# Patient Record
Sex: Female | Born: 1935 | Race: White | Hispanic: No | Smoking: Former smoker
Health system: Southern US, Community
[De-identification: ages and names within clinical notes are randomized; demographics above are authoritative.]

## PROBLEM LIST (undated history)

## (undated) ENCOUNTER — Emergency Department (HOSPITAL_COMMUNITY): Payer: Self-pay | Source: Home / Self Care

## (undated) DIAGNOSIS — M109 Gout, unspecified: Secondary | ICD-10-CM

## (undated) DIAGNOSIS — I10 Essential (primary) hypertension: Secondary | ICD-10-CM

## (undated) DIAGNOSIS — K5792 Diverticulitis of intestine, part unspecified, without perforation or abscess without bleeding: Secondary | ICD-10-CM

## (undated) DIAGNOSIS — I2510 Atherosclerotic heart disease of native coronary artery without angina pectoris: Secondary | ICD-10-CM

## (undated) DIAGNOSIS — C3490 Malignant neoplasm of unspecified part of unspecified bronchus or lung: Secondary | ICD-10-CM

## (undated) DIAGNOSIS — R609 Edema, unspecified: Secondary | ICD-10-CM

## (undated) DIAGNOSIS — Z950 Presence of cardiac pacemaker: Secondary | ICD-10-CM

## (undated) DIAGNOSIS — E785 Hyperlipidemia, unspecified: Secondary | ICD-10-CM

## (undated) DIAGNOSIS — R059 Cough, unspecified: Secondary | ICD-10-CM

## (undated) DIAGNOSIS — I119 Hypertensive heart disease without heart failure: Secondary | ICD-10-CM

## (undated) DIAGNOSIS — G43909 Migraine, unspecified, not intractable, without status migrainosus: Secondary | ICD-10-CM

## (undated) DIAGNOSIS — J449 Chronic obstructive pulmonary disease, unspecified: Secondary | ICD-10-CM

## (undated) DIAGNOSIS — G4730 Sleep apnea, unspecified: Secondary | ICD-10-CM

## (undated) DIAGNOSIS — I219 Acute myocardial infarction, unspecified: Secondary | ICD-10-CM

## (undated) DIAGNOSIS — E119 Type 2 diabetes mellitus without complications: Secondary | ICD-10-CM

## (undated) DIAGNOSIS — H9190 Unspecified hearing loss, unspecified ear: Secondary | ICD-10-CM

## (undated) DIAGNOSIS — R05 Cough: Secondary | ICD-10-CM

## (undated) DIAGNOSIS — Z8701 Personal history of pneumonia (recurrent): Secondary | ICD-10-CM

## (undated) DIAGNOSIS — Z9889 Other specified postprocedural states: Secondary | ICD-10-CM

## (undated) DIAGNOSIS — N184 Chronic kidney disease, stage 4 (severe): Secondary | ICD-10-CM

## (undated) DIAGNOSIS — E240 Pituitary-dependent Cushing's disease: Secondary | ICD-10-CM

## (undated) DIAGNOSIS — I429 Cardiomyopathy, unspecified: Secondary | ICD-10-CM

## (undated) DIAGNOSIS — M1990 Unspecified osteoarthritis, unspecified site: Secondary | ICD-10-CM

## (undated) DIAGNOSIS — Z8719 Personal history of other diseases of the digestive system: Secondary | ICD-10-CM

## (undated) DIAGNOSIS — I4819 Other persistent atrial fibrillation: Secondary | ICD-10-CM

## (undated) DIAGNOSIS — R0902 Hypoxemia: Secondary | ICD-10-CM

## (undated) DIAGNOSIS — Z889 Allergy status to unspecified drugs, medicaments and biological substances status: Secondary | ICD-10-CM

## (undated) DIAGNOSIS — I340 Nonrheumatic mitral (valve) insufficiency: Secondary | ICD-10-CM

## (undated) DIAGNOSIS — F329 Major depressive disorder, single episode, unspecified: Secondary | ICD-10-CM

## (undated) DIAGNOSIS — J45909 Unspecified asthma, uncomplicated: Secondary | ICD-10-CM

## (undated) DIAGNOSIS — I5032 Chronic diastolic (congestive) heart failure: Secondary | ICD-10-CM

## (undated) DIAGNOSIS — F32A Depression, unspecified: Secondary | ICD-10-CM

## (undated) DIAGNOSIS — D638 Anemia in other chronic diseases classified elsewhere: Secondary | ICD-10-CM

## (undated) DIAGNOSIS — K219 Gastro-esophageal reflux disease without esophagitis: Secondary | ICD-10-CM

## (undated) HISTORY — PX: WRIST FRACTURE SURGERY: SHX121

## (undated) HISTORY — PX: TONSILLECTOMY: SUR1361

## (undated) HISTORY — PX: ABLATION: SHX5711

## (undated) HISTORY — PX: ADRENALECTOMY: SHX876

## (undated) HISTORY — PX: APPENDECTOMY: SHX54

## (undated) HISTORY — PX: ABDOMINAL HYSTERECTOMY: SHX81

## (undated) HISTORY — PX: CHOLECYSTECTOMY: SHX55

## (undated) HISTORY — PX: BREAST CYST EXCISION: SHX579

## (undated) HISTORY — PX: FRACTURE SURGERY: SHX138

## (undated) HISTORY — PX: TUBAL LIGATION: SHX77

## (undated) HISTORY — DX: Other persistent atrial fibrillation: I48.19

---

## 2004-08-09 ENCOUNTER — Ambulatory Visit: Payer: Self-pay | Admitting: Family Medicine

## 2004-09-22 ENCOUNTER — Ambulatory Visit: Payer: Self-pay

## 2005-01-10 ENCOUNTER — Ambulatory Visit: Payer: Self-pay | Admitting: Internal Medicine

## 2005-01-26 ENCOUNTER — Ambulatory Visit: Payer: Self-pay | Admitting: Internal Medicine

## 2005-02-25 ENCOUNTER — Ambulatory Visit: Payer: Self-pay | Admitting: Internal Medicine

## 2005-03-28 ENCOUNTER — Ambulatory Visit: Payer: Self-pay | Admitting: Internal Medicine

## 2005-04-28 ENCOUNTER — Ambulatory Visit: Payer: Self-pay | Admitting: Internal Medicine

## 2005-05-28 ENCOUNTER — Ambulatory Visit: Payer: Self-pay | Admitting: Internal Medicine

## 2005-07-11 ENCOUNTER — Ambulatory Visit: Payer: Self-pay | Admitting: Unknown Physician Specialty

## 2005-07-28 ENCOUNTER — Ambulatory Visit: Payer: Self-pay | Admitting: Unknown Physician Specialty

## 2008-01-14 ENCOUNTER — Ambulatory Visit: Payer: Self-pay | Admitting: Family Medicine

## 2009-01-14 ENCOUNTER — Ambulatory Visit: Payer: Self-pay | Admitting: Family Medicine

## 2009-11-01 ENCOUNTER — Inpatient Hospital Stay: Payer: Self-pay | Admitting: Internal Medicine

## 2009-11-16 ENCOUNTER — Ambulatory Visit: Payer: Self-pay | Admitting: Internal Medicine

## 2009-11-18 ENCOUNTER — Ambulatory Visit: Payer: Self-pay | Admitting: Internal Medicine

## 2009-11-30 ENCOUNTER — Inpatient Hospital Stay: Payer: Self-pay | Admitting: Internal Medicine

## 2009-12-15 ENCOUNTER — Ambulatory Visit: Payer: Self-pay | Admitting: Internal Medicine

## 2010-04-16 ENCOUNTER — Emergency Department: Payer: Self-pay | Admitting: Emergency Medicine

## 2012-12-17 ENCOUNTER — Ambulatory Visit: Payer: Self-pay | Admitting: Internal Medicine

## 2012-12-25 ENCOUNTER — Ambulatory Visit: Payer: Self-pay | Admitting: Internal Medicine

## 2013-03-13 ENCOUNTER — Ambulatory Visit: Payer: Self-pay | Admitting: Internal Medicine

## 2013-03-24 ENCOUNTER — Ambulatory Visit: Payer: Self-pay | Admitting: Cardiothoracic Surgery

## 2013-03-24 ENCOUNTER — Ambulatory Visit: Payer: Self-pay | Admitting: Oncology

## 2013-03-24 LAB — CBC CANCER CENTER
Basophil #: 0.1 x10 3/mm (ref 0.0–0.1)
Eosinophil #: 0.2 x10 3/mm (ref 0.0–0.7)
HGB: 11.1 g/dL — ABNORMAL LOW (ref 12.0–16.0)
Lymphocyte #: 1.8 x10 3/mm (ref 1.0–3.6)
MCH: 26.3 pg (ref 26.0–34.0)
MCV: 78 fL — ABNORMAL LOW (ref 80–100)
Neutrophil #: 6.4 x10 3/mm (ref 1.4–6.5)
Neutrophil %: 71.3 %
Platelet: 236 x10 3/mm (ref 150–440)
RBC: 4.23 10*6/uL (ref 3.80–5.20)
RDW: 16.3 % — ABNORMAL HIGH (ref 11.5–14.5)
WBC: 9 x10 3/mm (ref 3.6–11.0)

## 2013-03-24 LAB — COMPREHENSIVE METABOLIC PANEL
Alkaline Phosphatase: 84 U/L (ref 50–136)
Anion Gap: 8 (ref 7–16)
Bilirubin,Total: 0.4 mg/dL (ref 0.2–1.0)
Calcium, Total: 9.3 mg/dL (ref 8.5–10.1)
Chloride: 102 mmol/L (ref 98–107)
Co2: 27 mmol/L (ref 21–32)
Creatinine: 1.58 mg/dL — ABNORMAL HIGH (ref 0.60–1.30)
EGFR (African American): 36 — ABNORMAL LOW
Glucose: 103 mg/dL — ABNORMAL HIGH (ref 65–99)
Osmolality: 279 (ref 275–301)
Potassium: 4.6 mmol/L (ref 3.5–5.1)
SGOT(AST): 24 U/L (ref 15–37)
Sodium: 137 mmol/L (ref 136–145)
Total Protein: 7.6 g/dL (ref 6.4–8.2)

## 2013-03-25 LAB — CEA: CEA: 0.6 ng/mL (ref 0.0–4.7)

## 2013-03-28 ENCOUNTER — Ambulatory Visit: Payer: Self-pay | Admitting: Oncology

## 2013-03-28 ENCOUNTER — Ambulatory Visit: Payer: Self-pay | Admitting: Cardiothoracic Surgery

## 2013-04-02 ENCOUNTER — Ambulatory Visit: Payer: Self-pay | Admitting: Internal Medicine

## 2013-04-25 ENCOUNTER — Emergency Department (HOSPITAL_COMMUNITY)
Admission: EM | Admit: 2013-04-25 | Discharge: 2013-04-25 | Disposition: A | Discharge status: Home / Self Care | Payer: PRIVATE HEALTH INSURANCE | Attending: Emergency Medicine | Admitting: Emergency Medicine

## 2013-04-25 ENCOUNTER — Encounter (HOSPITAL_COMMUNITY): Payer: Self-pay | Admitting: Emergency Medicine

## 2013-04-25 DIAGNOSIS — IMO0002 Reserved for concepts with insufficient information to code with codable children: Secondary | ICD-10-CM | POA: Insufficient documentation

## 2013-04-25 DIAGNOSIS — J4489 Other specified chronic obstructive pulmonary disease: Secondary | ICD-10-CM | POA: Insufficient documentation

## 2013-04-25 DIAGNOSIS — Z880 Allergy status to penicillin: Secondary | ICD-10-CM | POA: Insufficient documentation

## 2013-04-25 DIAGNOSIS — Z79899 Other long term (current) drug therapy: Secondary | ICD-10-CM | POA: Insufficient documentation

## 2013-04-25 DIAGNOSIS — Y9389 Activity, other specified: Secondary | ICD-10-CM | POA: Insufficient documentation

## 2013-04-25 DIAGNOSIS — I10 Essential (primary) hypertension: Secondary | ICD-10-CM | POA: Insufficient documentation

## 2013-04-25 DIAGNOSIS — Z87891 Personal history of nicotine dependence: Secondary | ICD-10-CM | POA: Insufficient documentation

## 2013-04-25 DIAGNOSIS — Y9289 Other specified places as the place of occurrence of the external cause: Secondary | ICD-10-CM | POA: Insufficient documentation

## 2013-04-25 DIAGNOSIS — E119 Type 2 diabetes mellitus without complications: Secondary | ICD-10-CM | POA: Insufficient documentation

## 2013-04-25 DIAGNOSIS — W1789XA Other fall from one level to another, initial encounter: Secondary | ICD-10-CM | POA: Insufficient documentation

## 2013-04-25 DIAGNOSIS — J449 Chronic obstructive pulmonary disease, unspecified: Secondary | ICD-10-CM | POA: Insufficient documentation

## 2013-04-25 DIAGNOSIS — I509 Heart failure, unspecified: Secondary | ICD-10-CM | POA: Insufficient documentation

## 2013-04-25 DIAGNOSIS — M549 Dorsalgia, unspecified: Secondary | ICD-10-CM

## 2013-04-25 HISTORY — DX: Essential (primary) hypertension: I10

## 2013-04-25 HISTORY — DX: Chronic obstructive pulmonary disease, unspecified: J44.9

## 2013-04-25 MED ORDER — CYCLOBENZAPRINE HCL 10 MG PO TABS
5.0000 mg | ORAL_TABLET | Freq: Once | ORAL | Status: AC
  Administered 2013-04-25: 5 mg via ORAL
  Filled 2013-04-25: qty 1

## 2013-04-25 MED ORDER — CYCLOBENZAPRINE HCL 5 MG PO TABS: 5.0000 mg | ORAL_TABLET | Freq: Two times a day (BID) | ORAL | Status: DC | PRN

## 2013-04-25 MED ORDER — IBUPROFEN 200 MG PO TABS
400.0000 mg | ORAL_TABLET | Freq: Once | ORAL | Status: AC
  Administered 2013-04-25: 400 mg via ORAL
  Filled 2013-04-25: qty 2

## 2013-04-25 MED ORDER — OXYCODONE-ACETAMINOPHEN 5-325 MG PO TABS: 1.0000 | ORAL_TABLET | Freq: Four times a day (QID) | ORAL | Status: DC | PRN

## 2013-04-25 MED ORDER — IBUPROFEN 400 MG PO TABS: 400.0000 mg | ORAL_TABLET | Freq: Four times a day (QID) | ORAL | Status: AC

## 2013-04-25 MED ORDER — OXYCODONE-ACETAMINOPHEN 5-325 MG PO TABS
1.0000 | ORAL_TABLET | Freq: Once | ORAL | Status: AC
  Administered 2013-04-25: 1 via ORAL
  Filled 2013-04-25: qty 1

## 2013-04-25 NOTE — ED Notes (Signed)
Pt stated that she slipped off a ladder yesterday but did not fall. Pt c/o lower back pain that radiates down left leg. She was able to deal with pain yesterday and today she reached down to grab a towel she got a shooting pain that radiated to left leg, stated that she broke out in a cold sweat and thought she was going to pass out.

## 2013-04-25 NOTE — ED Notes (Signed)
EDP made aware of pt's pain/spasms

## 2013-04-25 NOTE — ED Notes (Signed)
Pt called out, c/o nausea.

## 2013-04-25 NOTE — ED Notes (Signed)
PT comfortable with d/c and f/u instructions. Prescriptions x3 

## 2013-04-25 NOTE — ED Provider Notes (Signed)
CSN: [id number]     Arrival date & time 04/25/13  1859 History   First MD Initiated Contact with Patient 04/25/13 1905     Chief Complaint  Patient presents with  . Back Pain   (Consider location/radiation/quality/duration/timing/severity/associated sxs/prior Treatment) Patient is a 79 y.o. female presenting with back pain.  Back Pain Location:  Gluteal region Quality:  Cramping and shooting Radiates to:  L posterior upper leg Onset quality:  Sudden Duration:  2 days Timing:  Intermittent Progression:  Worsening Chronicity:  New Context comment:  Slipped off a ladder Relieved by:  Narcotics and cold packs Worsened by:  Bending and twisting Associated symptoms: no abdominal pain, no bladder incontinence, no bowel incontinence, no chest pain, no fever, no numbness, no paresthesias, no perianal numbness and no tingling     Past Medical History  Diagnosis Date  . COPD (chronic obstructive pulmonary disease)   . Diabetes mellitus without complication   . CHF (congestive heart failure)   . Hypertension    Past Surgical History  Procedure Laterality Date  . Adrenalectomy    . Abdominal hysterectomy    . Appendectomy    . Cholecystectomy     No family history on file. History  Substance Use Topics  . Smoking status: Former Smoker    Types: Cigarettes    Quit date: 04/25/1998  . Smokeless tobacco: Not on file  . Alcohol Use: No   OB History   Grav Para Term Preterm Abortions TAB SAB Ect Mult Living                 Review of Systems  Constitutional: Negative for fever.  HENT: Negative for congestion.   Respiratory: Negative for cough and shortness of breath.   Cardiovascular: Negative for chest pain.  Gastrointestinal: Negative for nausea, vomiting, abdominal pain, diarrhea and bowel incontinence.  Genitourinary: Negative for bladder incontinence.  Musculoskeletal: Positive for back pain.  Neurological: Negative for tingling, numbness and paresthesias.  All other  systems reviewed and are negative.    Allergies  Ciprofloxacin; Doxycycline; Penicillins; Sulfa antibiotics; and Morphine and related  Home Medications   Current Outpatient Rx  Name  Route  Sig  Dispense  Refill  . cholecalciferol (VITAMIN D) 1000 UNITS tablet   Oral   Take 1,000 Units by mouth daily.         . cyanocobalamin 500 MCG tablet   Oral   Take 500 mcg by mouth daily.         . digoxin (LANOXIN) 0.25 MG tablet   Oral   Take 0.25 mg by mouth daily.         Marland Kitchen diltiazem (TIAZAC) 240 MG 24 hr capsule   Oral   Take 240 mg by mouth daily.         . febuxostat (ULORIC) 40 MG tablet   Oral   Take 40 mg by mouth daily.          . fenofibrate (TRICOR) 48 MG tablet   Oral   Take 48 mg by mouth daily.         . ferrous sulfate 325 (65 FE) MG EC tablet   Oral   Take 325 mg by mouth 2 (two) times daily.         . Fluticasone-Salmeterol (ADVAIR) 100-50 MCG/DOSE AEPB   Inhalation   Inhale 1 puff into the lungs every 12 (twelve) hours.         Marland Kitchen HYDROcodone-acetaminophen (NORCO) 7.5-325 MG per tablet  Oral   Take 1.5 tablets by mouth once.         . metFORMIN (GLUCOPHAGE) 500 MG tablet   Oral   Take 500 mg by mouth 2 (two) times daily with a meal.         . metoprolol (LOPRESSOR) 50 MG tablet   Oral   Take 50 mg by mouth 2 (two) times daily.         . valsartan (DIOVAN) 160 MG tablet   Oral   Take 160 mg by mouth daily.         . vitamin C (ASCORBIC ACID) 500 MG tablet   Oral   Take 500 mg by mouth daily.          BP 174/53  Pulse 94  Temp(Src) 98.1 F (36.7 C) (Oral)  Resp 18  SpO2 94% Physical Exam  Nursing note and vitals reviewed. Constitutional: She is oriented to person, place, and time. She appears well-developed and well-nourished. No distress.  HENT:  Head: Normocephalic and atraumatic.  Mouth/Throat: Oropharynx is clear and moist.  Eyes: Conjunctivae are normal. Pupils are equal, round, and reactive to light. No  scleral icterus.  Neck: Neck supple.  Cardiovascular: Normal rate, regular rhythm, normal heart sounds and intact distal pulses.   No murmur heard. Pulmonary/Chest: Effort normal and breath sounds normal. No stridor. No respiratory distress. She has no rales.  Abdominal: Soft. Bowel sounds are normal. She exhibits no distension. There is no tenderness.  Musculoskeletal: Normal range of motion.       Lumbar back: She exhibits spasm. She exhibits no bony tenderness, no swelling and no deformity.       Back:  Neurological: She is alert and oriented to person, place, and time.  Skin: Skin is warm and dry. No rash noted.  Psychiatric: She has a normal mood and affect. Her behavior is normal.    ED Course  Procedures (including critical care time) Labs Review Labs Reviewed - No data to display Imaging Review No results found.  MDM   1. Acute back pain    79 yo female who slipped on a ladder two days ago, causing her to wrench her back. She did not actually fall, but just slipped.  Pain was manageable yesterday, but became worse today when she bent to pick up a towel.  No midline pain.  Normal lower extremity strength and sensation.  Pain improved with narcotics and muscle relaxers.  Although pain appears related to muscle spasm, we discussed other possible etiologies of pain including AAA and kidney stone.  Pt declined further workup (including urinalysis, bloodwork, abdominal ultrasound, abd/pel CT) for these causes and understood the risks of misdiagnosis including death.  She will follow up with her primary doctor.      Houston Siren, MD 04/26/13 [phone]

## 2013-04-25 NOTE — ED Notes (Signed)
EDP at bedside  

## 2013-04-28 ENCOUNTER — Ambulatory Visit: Payer: Self-pay | Admitting: Cardiothoracic Surgery

## 2013-04-28 ENCOUNTER — Ambulatory Visit: Payer: Self-pay | Admitting: Oncology

## 2013-05-22 ENCOUNTER — Ambulatory Visit: Payer: Self-pay | Admitting: Internal Medicine

## 2013-06-27 ENCOUNTER — Ambulatory Visit: Payer: Self-pay | Admitting: Oncology

## 2013-07-01 ENCOUNTER — Ambulatory Visit: Payer: Self-pay | Admitting: Oncology

## 2013-07-28 ENCOUNTER — Ambulatory Visit: Payer: Self-pay | Admitting: Oncology

## 2013-10-28 ENCOUNTER — Ambulatory Visit: Payer: Self-pay | Admitting: Oncology

## 2013-10-30 ENCOUNTER — Ambulatory Visit: Payer: Self-pay | Admitting: Cardiothoracic Surgery

## 2013-11-04 ENCOUNTER — Ambulatory Visit: Payer: Self-pay | Admitting: Oncology

## 2013-11-26 ENCOUNTER — Ambulatory Visit: Payer: Self-pay | Admitting: Cardiothoracic Surgery

## 2013-12-09 ENCOUNTER — Ambulatory Visit: Payer: Self-pay | Admitting: Oncology

## 2013-12-09 LAB — CBC WITH DIFFERENTIAL/PLATELET
BASOS PCT: 0.8 %
Basophil #: 0.1 10*3/uL (ref 0.0–0.1)
EOS PCT: 2.5 %
Eosinophil #: 0.2 10*3/uL (ref 0.0–0.7)
HCT: 37.2 % (ref 35.0–47.0)
HGB: 12.2 g/dL (ref 12.0–16.0)
LYMPHS ABS: 1.6 10*3/uL (ref 1.0–3.6)
Lymphocyte %: 16.7 %
MCH: 25.7 pg — AB (ref 26.0–34.0)
MCHC: 32.8 g/dL (ref 32.0–36.0)
MCV: 78 fL — ABNORMAL LOW (ref 80–100)
MONO ABS: 0.6 x10 3/mm (ref 0.2–0.9)
MONOS PCT: 5.9 %
NEUTROS PCT: 74.1 %
Neutrophil #: 7 10*3/uL — ABNORMAL HIGH (ref 1.4–6.5)
PLATELETS: 219 10*3/uL (ref 150–440)
RBC: 4.76 10*6/uL (ref 3.80–5.20)
RDW: 14.8 % — AB (ref 11.5–14.5)
WBC: 9.4 10*3/uL (ref 3.6–11.0)

## 2013-12-09 LAB — PROTIME-INR
INR: 1
Prothrombin Time: 12.8 secs (ref 11.5–14.7)

## 2013-12-17 LAB — PATHOLOGY REPORT

## 2013-12-26 ENCOUNTER — Ambulatory Visit: Payer: Self-pay | Admitting: Cardiothoracic Surgery

## 2014-01-01 ENCOUNTER — Ambulatory Visit: Payer: Self-pay | Admitting: Oncology

## 2014-01-26 ENCOUNTER — Ambulatory Visit: Payer: Self-pay | Admitting: Oncology

## 2014-03-04 ENCOUNTER — Ambulatory Visit: Payer: Self-pay | Admitting: Oncology

## 2014-03-04 LAB — CBC CANCER CENTER
BASOS ABS: 0 x10 3/mm (ref 0.0–0.1)
BASOS PCT: 0.6 %
EOS ABS: 0.2 x10 3/mm (ref 0.0–0.7)
EOS PCT: 2.3 %
HCT: 37.7 % (ref 35.0–47.0)
HGB: 12.3 g/dL (ref 12.0–16.0)
LYMPHS PCT: 12.8 %
Lymphocyte #: 1 x10 3/mm (ref 1.0–3.6)
MCH: 25.7 pg — AB (ref 26.0–34.0)
MCHC: 32.7 g/dL (ref 32.0–36.0)
MCV: 79 fL — ABNORMAL LOW (ref 80–100)
MONO ABS: 0.3 x10 3/mm (ref 0.2–0.9)
MONOS PCT: 4.5 %
NEUTROS PCT: 79.8 %
Neutrophil #: 6.1 x10 3/mm (ref 1.4–6.5)
Platelet: 246 x10 3/mm (ref 150–440)
RBC: 4.79 10*6/uL (ref 3.80–5.20)
RDW: 15.7 % — ABNORMAL HIGH (ref 11.5–14.5)
WBC: 7.7 x10 3/mm (ref 3.6–11.0)

## 2014-03-04 LAB — COMPREHENSIVE METABOLIC PANEL
ALK PHOS: 53 U/L
ALT: 65 U/L (ref 12–78)
ANION GAP: 10 (ref 7–16)
AST: 66 U/L — AB (ref 15–37)
Albumin: 3.9 g/dL (ref 3.4–5.0)
BUN: 28 mg/dL — ABNORMAL HIGH (ref 7–18)
Bilirubin,Total: 0.4 mg/dL (ref 0.2–1.0)
CALCIUM: 9.2 mg/dL (ref 8.5–10.1)
CO2: 28 mmol/L (ref 21–32)
Chloride: 103 mmol/L (ref 98–107)
Creatinine: 1.56 mg/dL — ABNORMAL HIGH (ref 0.60–1.30)
GFR CALC AF AMER: 37 — AB
GFR CALC NON AF AMER: 31 — AB
Glucose: 137 mg/dL — ABNORMAL HIGH (ref 65–99)
Osmolality: 289 (ref 275–301)
POTASSIUM: 4.2 mmol/L (ref 3.5–5.1)
SODIUM: 141 mmol/L (ref 136–145)
TOTAL PROTEIN: 7.3 g/dL (ref 6.4–8.2)

## 2014-03-28 ENCOUNTER — Ambulatory Visit: Payer: Self-pay | Admitting: Oncology

## 2014-04-28 ENCOUNTER — Ambulatory Visit: Payer: Self-pay | Admitting: Oncology

## 2014-06-01 ENCOUNTER — Ambulatory Visit: Payer: Self-pay | Admitting: Oncology

## 2014-06-09 ENCOUNTER — Ambulatory Visit: Payer: Self-pay | Admitting: Oncology

## 2014-06-10 LAB — CBC CANCER CENTER
Basophil #: 0.1 x10 3/mm (ref 0.0–0.1)
Basophil %: 0.9 %
Eosinophil #: 0.2 x10 3/mm (ref 0.0–0.7)
Eosinophil %: 2.6 %
HCT: 38.5 % (ref 35.0–47.0)
HGB: 12.5 g/dL (ref 12.0–16.0)
LYMPHS ABS: 0.8 x10 3/mm — AB (ref 1.0–3.6)
LYMPHS PCT: 12.2 %
MCH: 26.4 pg (ref 26.0–34.0)
MCHC: 32.4 g/dL (ref 32.0–36.0)
MCV: 82 fL (ref 80–100)
MONO ABS: 0.3 x10 3/mm (ref 0.2–0.9)
MONOS PCT: 5.5 %
Neutrophil #: 5 x10 3/mm (ref 1.4–6.5)
Neutrophil %: 78.8 %
PLATELETS: 194 x10 3/mm (ref 150–440)
RBC: 4.72 10*6/uL (ref 3.80–5.20)
RDW: 15.3 % — AB (ref 11.5–14.5)
WBC: 6.3 x10 3/mm (ref 3.6–11.0)

## 2014-06-10 LAB — COMPREHENSIVE METABOLIC PANEL
ALBUMIN: 3.8 g/dL (ref 3.4–5.0)
ALK PHOS: 55 U/L
AST: 17 U/L (ref 15–37)
Anion Gap: 8 (ref 7–16)
BILIRUBIN TOTAL: 0.6 mg/dL (ref 0.2–1.0)
BUN: 17 mg/dL (ref 7–18)
CO2: 27 mmol/L (ref 21–32)
Calcium, Total: 8.9 mg/dL (ref 8.5–10.1)
Chloride: 104 mmol/L (ref 98–107)
Creatinine: 1.52 mg/dL — ABNORMAL HIGH (ref 0.60–1.30)
GFR CALC AF AMER: 43 — AB
GFR CALC NON AF AMER: 35 — AB
Glucose: 124 mg/dL — ABNORMAL HIGH (ref 65–99)
Osmolality: 281 (ref 275–301)
POTASSIUM: 4.2 mmol/L (ref 3.5–5.1)
SGPT (ALT): 23 U/L
SODIUM: 139 mmol/L (ref 136–145)
TOTAL PROTEIN: 6.9 g/dL (ref 6.4–8.2)

## 2014-06-28 ENCOUNTER — Ambulatory Visit: Payer: Self-pay | Admitting: Oncology

## 2014-09-11 DIAGNOSIS — E119 Type 2 diabetes mellitus without complications: Secondary | ICD-10-CM | POA: Insufficient documentation

## 2014-12-08 ENCOUNTER — Ambulatory Visit
Admit: 2014-12-08 | Disposition: A | Discharge status: Home / Self Care | Payer: Self-pay | Attending: Oncology | Admitting: Oncology

## 2014-12-09 LAB — CBC CANCER CENTER
Basophil #: 0.1 x10 3/mm (ref 0.0–0.1)
Basophil %: 1 %
EOS ABS: 0.2 x10 3/mm (ref 0.0–0.7)
EOS PCT: 3.1 %
HCT: 36.1 % (ref 35.0–47.0)
HGB: 11.7 g/dL — AB (ref 12.0–16.0)
LYMPHS ABS: 0.9 x10 3/mm — AB (ref 1.0–3.6)
Lymphocyte %: 12.2 %
MCH: 25.1 pg — ABNORMAL LOW (ref 26.0–34.0)
MCHC: 32.4 g/dL (ref 32.0–36.0)
MCV: 78 fL — AB (ref 80–100)
MONO ABS: 0.4 x10 3/mm (ref 0.2–0.9)
Monocyte %: 4.6 %
Neutrophil #: 6 x10 3/mm (ref 1.4–6.5)
Neutrophil %: 79.1 %
Platelet: 226 x10 3/mm (ref 150–440)
RBC: 4.65 10*6/uL (ref 3.80–5.20)
RDW: 15.6 % — ABNORMAL HIGH (ref 11.5–14.5)
WBC: 7.6 x10 3/mm (ref 3.6–11.0)

## 2014-12-09 LAB — COMPREHENSIVE METABOLIC PANEL
ALBUMIN: 3.9 g/dL
AST: 25 U/L
Alkaline Phosphatase: 44 U/L
Anion Gap: 6 — ABNORMAL LOW (ref 7–16)
BUN: 31 mg/dL — ABNORMAL HIGH
Bilirubin,Total: 0.6 mg/dL
CALCIUM: 8.7 mg/dL — AB
CHLORIDE: 105 mmol/L
Co2: 23 mmol/L
Creatinine: 1.27 mg/dL — ABNORMAL HIGH
EGFR (African American): 46 — ABNORMAL LOW
EGFR (Non-African Amer.): 40 — ABNORMAL LOW
Glucose: 181 mg/dL — ABNORMAL HIGH
Potassium: 4.5 mmol/L
SGPT (ALT): 16 U/L
Sodium: 134 mmol/L — ABNORMAL LOW
Total Protein: 6.8 g/dL

## 2014-12-19 NOTE — Consult Note (Signed)
Reason for Visit: This 81 year old Female patient presents to the clinic for initial evaluation of  lung cancer .   Referred by Dr. Oliva Burke.  Diagnosis:  Chief Complaint/Diagnosis   81 year old female with non-small cell lung cancer right upper lobe stage I disease  Pathology Report pathology report has been requested for my review   Imaging Report CT scans and PET/CT scan reviewed   Referral Report clinical notes reviewed   Planned Treatment Regimen possible surgical resection versus SB RT   HPI   patient is a 81 year old female who's been followed for a slightly increasing right upper lobe not lung nodule. PET scan was performed showing low metabolic uptake. She also has known splenic masses as well as multiple hypodense masses in the kidneys. She also has chronic enlargement of her adrenal gland is status post left adrenal adenoma with a history of Cushing's syndrome. Recently had a needle biopsy although I do not see that report is been verbally reported as non-small cell lung cancer of the right upper lobe. She is seen today for discussion of possible treatment options. Options are right upper lobectomy versus stereotactic body radiation therapy. She is doing fairly well. She is having no specific complaints. She specifically denies cough hemoptysis or chest tightness.  Past Hx:    cushings syndrome:    gout:    COPD:    DM:    HTN:    rapid HR "skips beats":    appendectomy:    hysterectomy:   Past, Family and Social History:  Past Medical History positive   Cardiovascular hypertension; skipped beats   Respiratory COPD   Endocrine diabetes mellitus; Cushing's syndrome   Past Surgical History appendectomy; hysterectomy   Past Medical History Comments gallops   Family History positive   Family History Comments father with coronary artery disease no family history of cancer   Social History positive   Social History Comments greater than 40-pack-year  smoking history quit smoking 15 years prior snow EtOH abuse history   Additional Past Medical and Surgical History accompanied by multiple family members today   Allergies:   PCN: Rash, Resp. Distress  Doxycycline: Rash, SOB  Morphine: Itching  Latex: Unknown  Home Meds:  Home Medications: Medication Instructions Status  Diovan 160 mg oral tablet 1 tab(s) orally once a day Active  Uloric 40 mg oral tablet 1 tab(s) orally once a day Active  fenofibrate 48 mg oral tablet 1 tab(s) orally once a day Active  Vitamin B-12 500 mcg oral tablet 1 tab(s) orally once a day Active  Vitamin D3 1000 intl units oral tablet 1 tab(s) orally once a day Active  Vitamin C 500 mg oral tablet 1 tab(s) orally once a day Active  metoprolol 50 mg tablet 1 tab(s) orally 2 times a day Active  diltiazem 24 hour extended release 240 mg/24 hours oral capsule, extended release 1 cap(s) orally once a day Active  Zoloft 25 mg oral tablet 1 tab(s) orally once a day Active  Norco 325 mg-7.5 mg oral tablet 1 tab(s) orally once a day, As Needed Active  [place]   , As Needed Active  metFORMIN 500 mg oral tablet  orally once a day Active  Advair Diskus 250 mcg-50 mcg powder 1 puff(s) inhaled 2 times a day x 30 days  Active  Spiriva 18 mcg capsule 1 ea inhaled once a day x 30 days  Active  Vitamin D 2000 units daily  Active  Lanoxin 250 mcg (0.25 mg)  oral tablet 1  orally once a day  Active  Slow FE one tab by mouth twice a day  Active   Review of Systems:  General negative   Performance Status (ECOG) 0   Skin negative   Breast negative   Ophthalmologic negative   ENMT negative   Respiratory and Thorax see HPI   Cardiovascular negative   Gastrointestinal negative   Genitourinary negative   Musculoskeletal negative   Neurological negative   Psychiatric negative   Hematology/Lymphatics negative   Endocrine see HPI   Allergic/Immunologic negative   Review of Systems   review of systems  obtained from nurses notes  Nursing Notes:  Nursing Vital Signs and Chemo Nursing Nursing Notes: *CC Vital Signs Flowsheet:   22-Apr-15 09:45  Temp Temperature 97.2  Pulse Pulse 69  Respirations Respirations 21  SBP SBP 165  DBP DBP 65  Current Weight (kg) (kg) 75.2  Height (cm) centimeters 163  BSA (m2) 1.8   Physical Exam:  General/Skin/HEENT:  General normal   Skin normal   Eyes normal   ENMT normal   Head and Neck normal   Additional PE well-developed female in NAD walks with a cane foramina [place] assistance. No cervical or supraclavicular adenopathy is appreciated. Lungs are clear to A&P cardiac examination shows regular rate and rhythm. Abdomen is benign.   Breasts/Resp/CV/GI/GU:  Respiratory and Thorax normal   Cardiovascular normal   Gastrointestinal normal   Genitourinary normal   MS/Neuro/Psych/Lymph:  Musculoskeletal normal   Neurological normal   Lymphatics normal   Other Results:  Radiology Results: LabUnknown:    03-Mar-15 11:36, CT Chest Without Contrast  PACS Image     10-Mar-15 10:40, PET/CT Scan Lung Cancer Restaging  PACS Image   CT:    03-Mar-15 11:36, CT Chest Without Contrast  CT Chest Without Contrast   REASON FOR EXAM:    RUL nodule follow up  COMMENTS:       PROCEDURE: CT  - CT CHEST WITHOUT CONTRAST  - Oct 28 2013 11:36AM     CLINICAL DATA:  Followup lung nodule    EXAM:  CT CHEST WITHOUT CONTRAST    TECHNIQUE:  Multidetector CT imaging of the chest was performed following the  standard protocol without IV contrast.    COMPARISON:  06/27/2013  FINDINGS:  The heart size is normal. There is no pericardial effusion.  Calcified atherosclerotic disease involves the thoracic aorta as  well as the LAD, and a left circumflex coronary artery. No enlarged  mediastinal or hilar lymph node. There is no axillary or  supraclavicular adenopathy. Low attenuation nodule in the right lobe  of thyroid gland measures 1.4 cm and  appears unchanged from previous  exam. Left lobe thyroid nodule measures 1.7 cm, image 5/series 2.    No pericardial effusion. There is a part solid nodule identified  within the right upper lobe which measures 2.2 cm. The solid  component measures 1.6 cm, image 12/series 2. On the previous  examination this measures 1.8 cm with a solid component measuring  1.3 cm. Stable subpleural nodule in the right lower lobe measuring 5  mm, image 36/series 3. In the left lower lobe there is a 5 mm nodule  which is unchanged, image 37/series 3. Also in the left lower lobe  there is a 4 mm nodule, image 28/series 3. Left upper lobe nodule  measures 3 mm, image number 22/series 3.    Review of the visualized osseous structures is significant for  mild  thoracic spondylosis. There is no aggressive lytic or sclerotic bone  lesions. Incidental imaging through the upper abdomen again shows  splenomegaly. Low-attenuation lesions within the spleen are  identified and appear unchanged from previous exam. The largest  measures 5.2 cm, image 48/series 2. Low attenuation nodule in the  right adrenal gland is stable measuring 2.8 cm, image 52/series 2.  This likely represents a benign adenoma.     IMPRESSION:  1. Part solid nodule in the right upper lobe is slightly increased  in size when compared with 12/17/2012.  Adenocarcinoma is considered. Thoracic surgery consultation is  recommended. PET/CT should be considered for staging purposes.    These recommendations are taken from "Recommendations for the  Management of Subsolid Pulmonary Nodules Detected at CT: A Statement  from the [facility]" Radiology 2013; 266:1, 304-317.    2. Other small nonspecific nodules are unchanged from previous exam.    3.  Stable splenomegaly and low-attenuation splenic lesions.    4.  Atherosclerotic disease including coronary artery calcification    5. Thyroid nodules. Consider further evaluation with  thyroid  ultrasound. If patient is clinically hyperthyroid, consider nuclear  medicine thyroid uptake and scan.    6. Stable right adrenal nodule which likely represents a benign  adenoma      Electronically Signed    By: Kerby Moors M.D.    On: 10/28/2013 13:16         Verified By: Angelita Ingles, M.D.,  Nuclear Med:    10-Mar-15 10:40, PET/CT Scan Lung Cancer Restaging  PET/CT Scan Lung Cancer Restaging   REASON FOR EXAM:    lung nodule  FU  COMMENTS:       PROCEDURE: PET - PET/CT RESTAGING LUNG CA  - Nov 04 2013 10:40AM     CLINICAL DATA:  Subsequent treatment strategy for re-evaluation of  lung nodules. No recent radiation therapy or chemotherapy.Marland Kitchen    EXAM:  NUCLEAR MEDICINE PET SKULL BASE TO THIGH    FASTING BLOOD GLUCOSE:  Value: 173 mg/dl    TECHNIQUE:  12.3 mCi F-18 FDG was injected intravenously. Full-ring PET imaging  was performed from the skull base to thigh after the radiotracer. CT  data was obtained and used for attenuation correction and anatomic  localization.    COMPARISON:  CT CHEST W/O CM dated 10/28/2013; CT CHEST-ABD W/O CM  dated 06/27/2013; NM PET TUM IMG LTD AREA dated 12/25/2012; CT  CHEST-ABD W/O CM dated 03/13/2013; CT ABD-PELV W/O CM dated 11/30/2009    FINDINGS:  NECK    No areas of abnormal hypermetabolism.    CHEST  2.1 x 2.0 cm and a SUV of 2.1 on image 64/series 4. This measured  1.6 x 2.0 and a S.U.V. max of 1.4 on the prior exam. No abnormal  nodal hypermetabolism.    ABDOMEN/PELVIS    No areas of abnormal hypermetabolism. Photopenia corresponds to the  dominant low-density splenic lesion.    SKELETON    No abnormal marrow activity.    CT IMAGES PERFORMED FOR ATTENUATION CORRECTION  Multiple thoracic compression deformities, present on the prior of  10/28/2013.    Low-density bilateral thyroid nodules which are nonspecific but were  present on the prior exam. Larger on the left.    Aortic and branch vessel  atherosclerosis. Mild cardiomegaly with  coronary artery atherosclerosis. New metallic clips within the right  upper lobe, in the region of the lung nodule. These could relate to  prior biopsy. Mild  nodularity more medially on image 64/series 4 is  similar to on the prior PET. Left lower lobe nodularity is also not  significantly changed. Mild centrilobular emphysema.    Similar right adrenal thickening. Mild bilateral renal atrophy.  Fluid density bilateral renal lesions, likely cysts. There are also  bilateral hyper attenuating renal lesions which are indeterminate.  The largest is in the interpolar left kidney and measures 1.7 cm on  image 143. This is similar in size to on the prior exam. Fifty-one  HU.    Hepatomegaly. Suspicion of cirrhosis, mild. Prominence of the  lateral segment left liver lobe and caudate lobe.    Grossly similar splenomegaly. Low-density splenic lesions which are  similar. Cholecystectomy. Scattered colonic diverticula.  Hysterectomy.     IMPRESSION:  1. Right upper lobe lung nodule which demonstrates increased mild  hypermetabolism and suspicion of minimal interval enlargement since  12/25/2012. This remains suspicious for a relatively indolent  bronchogenic carcinoma such as low-grade adenocarcinoma. Tissue  sampling should be considered. Given the relative indolent behavior,  if the patient is a poor biopsy or treatment candidate, imaging  follow-up would be an alternate consideration.  2. No evidence of thoracic nodal or extrathoracic disease.  3. Hepatosplenomegaly. Similar splenic lesions which are  indeterminate. Question incidental benign hemangiomas or  lymphangiomas versus an atypical appearance of splenic infarcts.  4. Suspect mild cirrhosis.  Correlate with risk factors.  5. Bilateral indeterminate renal lesions. These could be hemorrhagic  cysts or solid neoplasms. The largest left-sided lesion is unchanged  in size. If further imaging  characterization is desired, the test of  choice is pre and post contrast abdominal MRI (preferred) or CT.  Electronically Signed    By: Abigail Miyamoto M.D.    On: 11/04/2013 12:49         Verified By: Areta Haber, M.D.,   Relevent Results:   Relevant Scans and Labs CT scan and PET CT scans are reviewed   Assessment and Plan: Impression:   stage I non-small cell lung cancer right upper lobe in 81 year old female with multiple comorbidities Plan:   at this time about our recommendations for stereotactic body radiation therapy. I believe she would be an excellent candidate for this choice of modality. I discussed with her the 95% overall local control rate with this type of therapy. We treated to 5000 cGy in 5 fractions. Patient will also be discussing surgical options with Dr. Genevive Bi. Patient Tonny Bollman nurse navigator know of her decision next week. Risks and benefits of radiation therapy including possible slight dysphasia, skin reaction, fatigue, structures normal lung volume all were discussed in detail the patient and her family. They all seem to comprehend my treatment plan well.  I would like to take this opportunity for allowing me to participate in the care of your patient..  CC Referral:  cc: Dr.Hande   Electronic Signatures: Lula Michaux, Roda Shutters (MD)  (Signed 22-Apr-15 11:18)  Authored: HPI, Diagnosis, Past Hx, PFSH, Allergies, Home Meds, ROS, Nursing Notes, Physical Exam, Other Results, Relevent Results, Encounter Assessment and Plan, CC Referring Physician   Last Updated: 22-Apr-15 11:18 by Armstead Peaks (MD)

## 2014-12-21 ENCOUNTER — Ambulatory Visit
Admit: 2014-12-21 | Disposition: A | Discharge status: Home / Self Care | Payer: Self-pay | Attending: Oncology | Admitting: Oncology

## 2014-12-22 ENCOUNTER — Encounter (HOSPITAL_COMMUNITY)
Admission: EM | Disposition: A | Discharge status: Home / Self Care | Payer: Self-pay | Source: Other Acute Inpatient Hospital | Attending: Cardiovascular Disease

## 2014-12-22 ENCOUNTER — Inpatient Hospital Stay (HOSPITAL_COMMUNITY)
Admission: EM | Admit: 2014-12-22 | Discharge: 2014-12-24 | DRG: 247 | Disposition: A | Discharge status: Home / Self Care | Payer: Medicare Other | Source: Other Acute Inpatient Hospital | Attending: Cardiovascular Disease | Admitting: Cardiovascular Disease

## 2014-12-22 ENCOUNTER — Ambulatory Visit (HOSPITAL_COMMUNITY): Payer: Medicare Other

## 2014-12-22 ENCOUNTER — Encounter (HOSPITAL_COMMUNITY): Payer: Self-pay

## 2014-12-22 DIAGNOSIS — R002 Palpitations: Secondary | ICD-10-CM

## 2014-12-22 DIAGNOSIS — I129 Hypertensive chronic kidney disease with stage 1 through stage 4 chronic kidney disease, or unspecified chronic kidney disease: Secondary | ICD-10-CM | POA: Diagnosis present

## 2014-12-22 DIAGNOSIS — I25110 Atherosclerotic heart disease of native coronary artery with unstable angina pectoris: Secondary | ICD-10-CM | POA: Diagnosis not present

## 2014-12-22 DIAGNOSIS — Z885 Allergy status to narcotic agent status: Secondary | ICD-10-CM

## 2014-12-22 DIAGNOSIS — D3502 Benign neoplasm of left adrenal gland: Secondary | ICD-10-CM | POA: Diagnosis present

## 2014-12-22 DIAGNOSIS — Z881 Allergy status to other antibiotic agents status: Secondary | ICD-10-CM

## 2014-12-22 DIAGNOSIS — I509 Heart failure, unspecified: Secondary | ICD-10-CM | POA: Diagnosis present

## 2014-12-22 DIAGNOSIS — N183 Chronic kidney disease, stage 3 (moderate): Secondary | ICD-10-CM | POA: Diagnosis present

## 2014-12-22 DIAGNOSIS — Z880 Allergy status to penicillin: Secondary | ICD-10-CM

## 2014-12-22 DIAGNOSIS — I4891 Unspecified atrial fibrillation: Secondary | ICD-10-CM | POA: Diagnosis present

## 2014-12-22 DIAGNOSIS — I209 Angina pectoris, unspecified: Secondary | ICD-10-CM

## 2014-12-22 DIAGNOSIS — I249 Acute ischemic heart disease, unspecified: Secondary | ICD-10-CM | POA: Diagnosis present

## 2014-12-22 DIAGNOSIS — I200 Unstable angina: Secondary | ICD-10-CM | POA: Diagnosis present

## 2014-12-22 DIAGNOSIS — I10 Essential (primary) hypertension: Secondary | ICD-10-CM

## 2014-12-22 DIAGNOSIS — Z7902 Long term (current) use of antithrombotics/antiplatelets: Secondary | ICD-10-CM

## 2014-12-22 DIAGNOSIS — Z7901 Long term (current) use of anticoagulants: Secondary | ICD-10-CM

## 2014-12-22 DIAGNOSIS — Z923 Personal history of irradiation: Secondary | ICD-10-CM

## 2014-12-22 DIAGNOSIS — R079 Chest pain, unspecified: Secondary | ICD-10-CM

## 2014-12-22 DIAGNOSIS — R0789 Other chest pain: Secondary | ICD-10-CM

## 2014-12-22 DIAGNOSIS — Z79899 Other long term (current) drug therapy: Secondary | ICD-10-CM

## 2014-12-22 DIAGNOSIS — Z955 Presence of coronary angioplasty implant and graft: Secondary | ICD-10-CM

## 2014-12-22 DIAGNOSIS — Z87891 Personal history of nicotine dependence: Secondary | ICD-10-CM

## 2014-12-22 DIAGNOSIS — Z85118 Personal history of other malignant neoplasm of bronchus and lung: Secondary | ICD-10-CM

## 2014-12-22 DIAGNOSIS — Z882 Allergy status to sulfonamides status: Secondary | ICD-10-CM

## 2014-12-22 DIAGNOSIS — E249 Cushing's syndrome, unspecified: Secondary | ICD-10-CM | POA: Diagnosis present

## 2014-12-22 DIAGNOSIS — E119 Type 2 diabetes mellitus without complications: Secondary | ICD-10-CM

## 2014-12-22 DIAGNOSIS — J449 Chronic obstructive pulmonary disease, unspecified: Secondary | ICD-10-CM | POA: Diagnosis present

## 2014-12-22 HISTORY — DX: Gout, unspecified: M10.9

## 2014-12-22 HISTORY — DX: Pituitary-dependent Cushing's disease: E24.0

## 2014-12-22 HISTORY — DX: Malignant neoplasm of unspecified part of unspecified bronchus or lung: C34.90

## 2014-12-22 HISTORY — DX: Type 2 diabetes mellitus without complications: E11.9

## 2014-12-22 HISTORY — DX: Gastro-esophageal reflux disease without esophagitis: K21.9

## 2014-12-22 HISTORY — DX: Migraine, unspecified, not intractable, without status migrainosus: G43.909

## 2014-12-22 HISTORY — DX: Sleep apnea, unspecified: G47.30

## 2014-12-22 HISTORY — DX: Depression, unspecified: F32.A

## 2014-12-22 HISTORY — DX: Unspecified osteoarthritis, unspecified site: M19.90

## 2014-12-22 HISTORY — DX: Acute myocardial infarction, unspecified: I21.9

## 2014-12-22 HISTORY — DX: Unspecified asthma, uncomplicated: J45.909

## 2014-12-22 HISTORY — DX: Major depressive disorder, single episode, unspecified: F32.9

## 2014-12-22 HISTORY — DX: Personal history of other diseases of the digestive system: Z87.19

## 2014-12-22 LAB — BASIC METABOLIC PANEL
ANION GAP: 14 (ref 5–15)
BUN: 20 mg/dL (ref 6–23)
CALCIUM: 9 mg/dL (ref 8.4–10.5)
CHLORIDE: 99 mmol/L (ref 96–112)
CO2: 24 mmol/L (ref 19–32)
Creatinine, Ser: 1.38 mg/dL — ABNORMAL HIGH (ref 0.50–1.10)
GFR calc Af Amer: 41 mL/min — ABNORMAL LOW (ref >90)
GFR calc non Af Amer: 35 mL/min — ABNORMAL LOW (ref >90)
Glucose, Bld: 258 mg/dL — ABNORMAL HIGH (ref 70–99)
Potassium: 4.2 mmol/L (ref 3.5–5.1)
Sodium: 137 mmol/L (ref 135–145)

## 2014-12-22 LAB — CBC
HCT: 36.1 % (ref 36.0–46.0)
HEMOGLOBIN: 11.7 g/dL — AB (ref 12.0–15.0)
MCH: 25.4 pg — ABNORMAL LOW (ref 26.0–34.0)
MCHC: 32.4 g/dL (ref 30.0–36.0)
MCV: 78.5 fL (ref 78.0–100.0)
Platelets: 204 10*3/uL (ref 150–400)
RBC: 4.6 MIL/uL (ref 3.87–5.11)
RDW: 14.6 % (ref 11.5–15.5)
WBC: 9 10*3/uL (ref 4.0–10.5)

## 2014-12-22 LAB — I-STAT TROPONIN, ED: TROPONIN I, POC: 0 ng/mL (ref 0.00–0.08)

## 2014-12-22 SURGERY — LEFT HEART CATHETERIZATION WITH CORONARY ANGIOGRAM
Anesthesia: LOCAL

## 2014-12-22 MED ORDER — ASPIRIN 81 MG PO CHEW
324.0000 mg | CHEWABLE_TABLET | Freq: Once | ORAL | Status: DC
  Filled 2014-12-22: qty 4

## 2014-12-22 NOTE — ED Notes (Signed)
Pt here called stemi with ems, pt here for chest pain, at 2200, no pain on arrival. sr with ems and hx of afib, hx of 2 spells in last week with no treatment, pt hx of MI

## 2014-12-22 NOTE — ED Provider Notes (Addendum)
CSN: [id number]     Arrival date & time 12/22/14  2249 History   None    Chief Complaint  Patient presents with  . Code STEMI     (Consider location/radiation/quality/duration/timing/severity/associated sxs/prior Treatment) HPI Comments: Pt comes in with cc of chest pain. Pt has hx of COPD, DM, CHF, Lung CA, no chemo or radiation currently. Reports that she started having some palpitations and chest discomfort prior to ER arrival - as she was throwing away garbage. Pt is currently chest pain free. Pt had associated dib. No new cough. No hx of PE, DVT. Denies any heart attacks in the past that needed stents/surgeries.   ROS 10 Systems reviewed and are negative for acute change except as noted in the HPI.     The history is provided by the patient.    Past Medical History  Diagnosis Date  . COPD (chronic obstructive pulmonary disease)   . Diabetes mellitus without complication   . CHF (congestive heart failure)   . Hypertension   . Cancer   . Lung cancer    Past Surgical History  Procedure Laterality Date  . Adrenalectomy    . Abdominal hysterectomy    . Appendectomy    . Cholecystectomy     History reviewed. No pertinent family history. History  Substance Use Topics  . Smoking status: Former Smoker    Types: Cigarettes    Quit date: 04/25/1998  . Smokeless tobacco: Not on file  . Alcohol Use: No   OB History    No data available     Review of Systems  Cardiovascular: Positive for chest pain.  All other systems reviewed and are negative.     Allergies  Ciprofloxacin; Doxycycline; Penicillins; Sulfa antibiotics; and Morphine and related  Home Medications   Prior to Admission medications   Medication Sig Start Date End Date Taking? Authorizing Provider  cholecalciferol (VITAMIN D) 1000 UNITS tablet Take 1,000 Units by mouth daily.    Historical Provider, MD  cyanocobalamin 500 MCG tablet Take 500 mcg by mouth daily.    Historical Provider, MD   cyclobenzaprine (FLEXERIL) 5 MG tablet Take 1 tablet (5 mg total) by mouth 2 (two) times daily as needed for muscle spasms. 04/25/13   Serita Grit, MD  digoxin (LANOXIN) 0.25 MG tablet Take 0.25 mg by mouth daily.    Historical Provider, MD  diltiazem (TIAZAC) 240 MG 24 hr capsule Take 240 mg by mouth daily.    Historical Provider, MD  febuxostat (ULORIC) 40 MG tablet Take 40 mg by mouth daily.     Historical Provider, MD  fenofibrate (TRICOR) 48 MG tablet Take 48 mg by mouth daily.    Historical Provider, MD  ferrous sulfate 325 (65 FE) MG EC tablet Take 325 mg by mouth 2 (two) times daily.    Historical Provider, MD  Fluticasone-Salmeterol (ADVAIR) 100-50 MCG/DOSE AEPB Inhale 1 puff into the lungs every 12 (twelve) hours.    Historical Provider, MD  HYDROcodone-acetaminophen (NORCO) 7.5-325 MG per tablet Take 1.5 tablets by mouth once.    Historical Provider, MD  metFORMIN (GLUCOPHAGE) 500 MG tablet Take 500 mg by mouth 2 (two) times daily with a meal.    Historical Provider, MD  metoprolol (LOPRESSOR) 50 MG tablet Take 50 mg by mouth 2 (two) times daily.    Historical Provider, MD  oxyCODONE-acetaminophen (PERCOCET/ROXICET) 5-325 MG per tablet Take 1 tablet by mouth every 6 (six) hours as needed for pain. 04/25/13   Serita Grit, MD  valsartan (DIOVAN) 160 MG tablet Take 160 mg by mouth daily.    Historical Provider, MD  vitamin C (ASCORBIC ACID) 500 MG tablet Take 500 mg by mouth daily.    Historical Provider, MD   BP 144/65 mmHg  Pulse 86  Temp(Src) 99.1 F (37.3 C) (Oral)  Resp 20  SpO2 97% Physical Exam  Constitutional: She is oriented to person, place, and time. She appears well-developed and well-nourished.  HENT:  Head: Normocephalic and atraumatic.  Eyes: EOM are normal. Pupils are equal, round, and reactive to light.  Neck: Neck supple. No JVD present.  Cardiovascular: Normal rate, regular rhythm, normal heart sounds and intact distal pulses.   No murmur  heard. Pulmonary/Chest: Effort normal. No respiratory distress.  Abdominal: Soft. She exhibits no distension. There is no tenderness. There is no rebound and no guarding.  Musculoskeletal: She exhibits no edema.  Neurological: She is alert and oriented to person, place, and time.  Skin: Skin is warm and dry.  Nursing note and vitals reviewed.   ED Course  Procedures (including critical care time) Labs Review Labs Reviewed  CBC - Abnormal; Notable for the following:    Hemoglobin 11.7 (*)    MCH 25.4 (*)    All other components within normal limits  BASIC METABOLIC PANEL - Abnormal; Notable for the following:    Glucose, Bld 258 (*)    Creatinine, Ser 1.38 (*)    GFR calc non Af Amer 35 (*)    GFR calc Af Amer 41 (*)    All other components within normal limits  I-STAT TROPOININ, ED    Imaging Review Nm Pet Image Restag (ps) Skull Base To Thigh  12/21/2014   CLINICAL DATA:  Subsequent treatment strategy for right upper lobe lung cancer. Chest pain. Cough.  EXAM: NUCLEAR MEDICINE PET SKULL BASE TO THIGH  TECHNIQUE: 12.7 mCi F-18 FDG was injected intravenously. Full-ring PET imaging was performed from the skull base to thigh after the radiotracer. CT data was obtained and used for attenuation correction and anatomic localization.  FASTING BLOOD GLUCOSE:  Value: 137 mg/dl  COMPARISON:  Multiple exams, including 06/01/2014  FINDINGS: NECK  No hypermetabolic lymph nodes in the neck.  CHEST  The original right upper lobe nodule measures 1.3 by 0.7 cm on image 72 of series 3 and has a maximum standard uptake value 1.9.  New curvilinear density posterior to this measures up to 3.7 by 2.7 cm and has a maximum standard uptake value of 2.1.  There is some mild scattered nodularity in both lungs without additional hypermetabolic activity. Small hypodense right thyroid lesion noted. Coronary, aortic, and branch vessel atherosclerotic calcification observed.  ABDOMEN/PELVIS  Photopenic splenic lesions  with once again noted. Bilateral renal cysts of varying complexity.  Aortoiliac atherosclerotic vascular disease. Prior cholecystectomy. Stable right adrenal adenoma.  SKELETON  No focal hypermetabolic activity to suggest skeletal metastasis. Grade 1 anterolisthesis at L4-5 with associated degenerative facet arthropathy and suspected impingement.  IMPRESSION: 1. Reduced size and metabolic activity of the right upper lobe lesion. However, there is a new curvilinear band of abnormal density in the right upper lobe adjacent to this lesion. Both the lesion and the abnormal adjacent new density measure with a standard uptake value of approximately 2, below the typical specific threshold for malignancy, and the adjacent band of density may well be inflammatory or infectious. Consider followup chest radiography for observation. 2. Other findings include atherosclerosis, a stable right adrenal adenoma, photopenic splenic lesions with splenomegaly,  bilateral renal cysts of varying complexity, and grade 1 anterolisthesis at the L4-5 level associated with impingement.   Electronically Signed   By: Van Clines M.D.   On: 12/21/2014 12:31     EKG Interpretation None     ED ECG REPORT   Date: 12/22/2014  Rate: 103  Rhythm: sinus tachycardia  QRS Axis: normal  Intervals: normal  ST/T Wave abnormalities: ST elevations anteriorly and ST depressions laterally  Conduction Disutrbances:none RBBB - partial  Narrative Interpretation:   Old EKG Reviewed: none available  I have personally reviewed the EKG tracing and agree with the computerized printout as noted.    MDM   Final diagnoses:  Angina pectoris  Chest pain of uncertain etiology  Palpitations    Pt comes in with cc of chest pain. Reports palpitations with sharp chest pain. Code STEMI was activated on the field for the EKG changes, however, pt is asymptomatic, and Dr. Claiborne Billings, Cardiology, has cancelled the CODE STEMI after reviewing the EKG and  evaluating the patient. She has hx of lung CA, but the pain was not pleuritic. PE is possible, but given the ekg changes, i think cardiac etiology is higher than PE as the etiology. However, given the dyspnea and tachycardia, palpitations - pt will get vq scan tomorrow, and heparin for PE / ACS now.  Cards to admit.   Varney Biles, MD 12/22/14 1443  Varney Biles, MD 12/22/14 6016

## 2014-12-22 NOTE — Progress Notes (Signed)
   12/22/14 2300  Clinical Encounter Type  Visited With Health care provider  Visit Type Initial;Code;ED   Chaplain was paged to the ED for a code stemi at 10:11 PM. When patient arrived no family was present. Chaplain spoke to EMS. EMS indicated that a daughter of the patient may have followed the ambulance. No chaplain support appears needed at this time. Page Elodia Florence chaplain if assistance needed later tonight.  Gar Ponto, Chaplain  11:03 PM

## 2014-12-22 NOTE — H&P (Signed)
Cardiology History and Physical  PCP: PROVIDER NOT IN SYSTEM Cardiologist: None  History of Present Illness (and review of medical records): Regina Burke is a 81 y.o. female who presents for evaluation of chest pain.  She has hx of non-small cell lung ca s/p radiation therapy, DM, HTN, COPD, CKD III, cushings syndrome, and left adrenal adenoma.  She has no known hx of prior MI or CAD.  She states felt her heart racing as she got up to take something to the trash.  She also felt her heart skipping beats.  This was associated with shortness of breath and 10/10 chest pain that radiated across her chest.  Describes as someone beating on her chest.  Patients daughter called EMS as pain lasted more than 1hr with no relief.  She reports similar episode one week prior that lasted for one hour.  Pain went away per pt with rest and using a fan to keep her relaxed.  She was initally called in as a STEMI.  Dr. Claiborne Billings and I reviewed her ekg and discussed with ED attending.  Patient did not meet criteria for STEMI.  She was also chest pain free on arrival.  Repeat EKG in ED also did not meet criteria.  Review of Systems A comprehensive review of systems was negative except for: Constitutional: positive for malaise Respiratory: positive for dyspnea on exertion Cardiovascular: positive for palpitations Behavioral/Psych: positive for social stressors Further review of systems was otherwise negative other than stated in HPI.   Past Medical History  Diagnosis Date  . COPD (chronic obstructive pulmonary disease)   . Diabetes mellitus without complication   . CHF (congestive heart failure)   . Hypertension   . Cancer   . Lung cancer     Past Surgical History  Procedure Laterality Date  . Adrenalectomy    . Abdominal hysterectomy    . Appendectomy    . Cholecystectomy       (Not in a hospital admission) Allergies  Allergen Reactions  . Ciprofloxacin Shortness Of Breath, Itching and Rash  .  Doxycycline Shortness Of Breath, Itching and Rash  . Penicillins Shortness Of Breath, Itching and Rash  . Sulfa Antibiotics Shortness Of Breath, Itching and Rash  . Morphine And Related Itching    History  Substance Use Topics  . Smoking status: Former Smoker    Types: Cigarettes    Quit date: 04/25/1998  . Smokeless tobacco: Not on file  . Alcohol Use: No    History reviewed. No pertinent family history.   Objective:  Patient Vitals for the past 8 hrs:  BP Temp Temp src Pulse Resp SpO2  12/22/14 2335 144/65 mmHg - - 86 20 97 %  12/22/14 2316 156/68 mmHg - - 93 18 -  12/22/14 2302 154/66 mmHg 99.1 F (37.3 C) Oral 102 23 (!) 87 %   General appearance: alert, cooperative, appears stated age Head: Normocephalic, without obvious abnormality, atraumatic Eyes: conjunctivae/corneas clear. PERRL, EOM's intact. Neck: no carotid bruit, no JVD and supple, Lungs: clear to auscultation bilaterally Chest wall: no tenderness Heart: regular rate and rhythm, S1, S2 normal, no murmur, click, rub or gallop Abdomen: soft, non-tender; bowel sounds normal Extremities: extremities normal, atraumatic, no cyanosis or edema Pulses: 2+ and symmetric Neurologic: Grossly normal  Results for orders placed or performed during the hospital encounter of 12/22/14 (from the past 48 hour(s))  CBC     Status: Abnormal   Collection Time: 12/22/14 11:09 PM  Result Value Ref Range  WBC 9.0 4.0 - 10.5 K/uL   RBC 4.60 3.87 - 5.11 MIL/uL   Hemoglobin 11.7 (L) 12.0 - 15.0 g/dL   HCT 36.1 36.0 - 46.0 %   MCV 78.5 78.0 - 100.0 fL   MCH 25.4 (L) 26.0 - 34.0 pg   MCHC 32.4 30.0 - 36.0 g/dL   RDW 14.6 11.5 - 15.5 %   Platelets 204 150 - 400 K/uL  Basic metabolic panel     Status: Abnormal   Collection Time: 12/22/14 11:09 PM  Result Value Ref Range   Sodium 137 135 - 145 mmol/L   Potassium 4.2 3.5 - 5.1 mmol/L   Chloride 99 96 - 112 mmol/L   CO2 24 19 - 32 mmol/L   Glucose, Bld 258 (H) 70 - 99 mg/dL   BUN  20 6 - 23 mg/dL   Creatinine, Ser 1.38 (H) 0.50 - 1.10 mg/dL   Calcium 9.0 8.4 - 10.5 mg/dL   GFR calc non Af Amer 35 (L) >90 mL/min   GFR calc Af Amer 41 (L) >90 mL/min    Comment: (NOTE) The eGFR has been calculated using the CKD EPI equation. This calculation has not been validated in all clinical situations. eGFR's persistently <90 mL/min signify possible Chronic Kidney Disease.    Anion gap 14 5 - 15  I-stat troponin, ED (not at [facility])     Status: None   Collection Time: 12/22/14 11:13 PM  Result Value Ref Range   Troponin i, poc 0.00 0.00 - 0.08 ng/mL   Comment 3            Comment: Due to the release kinetics of cTnI, a negative result within the first hours of the onset of symptoms does not rule out myocardial infarction with certainty. If myocardial infarction is still suspected, repeat the test at appropriate intervals.    Nm Pet Image Restag (ps) Skull Base To Thigh  12/21/2014   CLINICAL DATA:  Subsequent treatment strategy for right upper lobe lung cancer. Chest pain. Cough.  EXAM: NUCLEAR MEDICINE PET SKULL BASE TO THIGH  TECHNIQUE: 12.7 mCi F-18 FDG was injected intravenously. Full-ring PET imaging was performed from the skull base to thigh after the radiotracer. CT data was obtained and used for attenuation correction and anatomic localization.  FASTING BLOOD GLUCOSE:  Value: 137 mg/dl  COMPARISON:  Multiple exams, including 06/01/2014  FINDINGS: NECK  No hypermetabolic lymph nodes in the neck.  CHEST  The original right upper lobe nodule measures 1.3 by 0.7 cm on image 72 of series 3 and has a maximum standard uptake value 1.9.  New curvilinear density posterior to this measures up to 3.7 by 2.7 cm and has a maximum standard uptake value of 2.1.  There is some mild scattered nodularity in both lungs without additional hypermetabolic activity. Small hypodense right thyroid lesion noted. Coronary, aortic, and branch vessel atherosclerotic calcification observed.  ABDOMEN/PELVIS   Photopenic splenic lesions with once again noted. Bilateral renal cysts of varying complexity.  Aortoiliac atherosclerotic vascular disease. Prior cholecystectomy. Stable right adrenal adenoma.  SKELETON  No focal hypermetabolic activity to suggest skeletal metastasis. Grade 1 anterolisthesis at L4-5 with associated degenerative facet arthropathy and suspected impingement.  IMPRESSION: 1. Reduced size and metabolic activity of the right upper lobe lesion. However, there is a new curvilinear band of abnormal density in the right upper lobe adjacent to this lesion. Both the lesion and the abnormal adjacent new density measure with a standard uptake value of approximately 2,  below the typical specific threshold for malignancy, and the adjacent band of density may well be inflammatory or infectious. Consider followup chest radiography for observation. 2. Other findings include atherosclerosis, a stable right adrenal adenoma, photopenic splenic lesions with splenomegaly, bilateral renal cysts of varying complexity, and grade 1 anterolisthesis at the L4-5 level associated with impingement.   Electronically Signed   By: Van Clines M.D.   On: 12/21/2014 12:31    ECG:  Hr 103, sinus tach, LVH with secondary repol changes and widened QRS, cannot exclude anterior infarct. No prior ekgs to compare.  Assessment: Possible ACS, Code STEMI cancelled. Also in differential is PE, paroxysmal arrhythmia Hypertension Diabetes Mellitus CKD III COPD Hx non small cell lung ca s/p radiation  Plan: 1. Cardiology Admission  2. Continuous monitoring on Telemetry. 3. Repeat ekg on admit, prn chest pain or arrythmia 4. Trend cardiac biomarkers, check lipids, hgba1c, tsh 5. Medical management to include ASA, Heparin gtt, BB, Statin, NTG prn 6. NPO with gentle IVFs 7. TTE in am assess LV function, wall motion and valves 8. Reassess in am, further evaluations pending intial studies and clinical course.

## 2014-12-23 ENCOUNTER — Encounter (HOSPITAL_COMMUNITY): Payer: Self-pay | Admitting: Physician Assistant

## 2014-12-23 ENCOUNTER — Encounter (HOSPITAL_COMMUNITY)
Admission: EM | Disposition: A | Discharge status: Home / Self Care | Payer: Self-pay | Source: Other Acute Inpatient Hospital | Attending: Cardiovascular Disease

## 2014-12-23 DIAGNOSIS — R079 Chest pain, unspecified: Secondary | ICD-10-CM | POA: Diagnosis present

## 2014-12-23 DIAGNOSIS — Z882 Allergy status to sulfonamides status: Secondary | ICD-10-CM | POA: Diagnosis not present

## 2014-12-23 DIAGNOSIS — I10 Essential (primary) hypertension: Secondary | ICD-10-CM | POA: Diagnosis not present

## 2014-12-23 DIAGNOSIS — I249 Acute ischemic heart disease, unspecified: Secondary | ICD-10-CM | POA: Diagnosis present

## 2014-12-23 DIAGNOSIS — I4891 Unspecified atrial fibrillation: Secondary | ICD-10-CM | POA: Diagnosis present

## 2014-12-23 DIAGNOSIS — Z87891 Personal history of nicotine dependence: Secondary | ICD-10-CM | POA: Diagnosis not present

## 2014-12-23 DIAGNOSIS — D3502 Benign neoplasm of left adrenal gland: Secondary | ICD-10-CM | POA: Diagnosis present

## 2014-12-23 DIAGNOSIS — Z923 Personal history of irradiation: Secondary | ICD-10-CM | POA: Diagnosis not present

## 2014-12-23 DIAGNOSIS — R002 Palpitations: Secondary | ICD-10-CM

## 2014-12-23 DIAGNOSIS — Z7901 Long term (current) use of anticoagulants: Secondary | ICD-10-CM | POA: Diagnosis not present

## 2014-12-23 DIAGNOSIS — I200 Unstable angina: Secondary | ICD-10-CM | POA: Diagnosis present

## 2014-12-23 DIAGNOSIS — Z881 Allergy status to other antibiotic agents status: Secondary | ICD-10-CM | POA: Diagnosis not present

## 2014-12-23 DIAGNOSIS — Z885 Allergy status to narcotic agent status: Secondary | ICD-10-CM | POA: Diagnosis not present

## 2014-12-23 DIAGNOSIS — I129 Hypertensive chronic kidney disease with stage 1 through stage 4 chronic kidney disease, or unspecified chronic kidney disease: Secondary | ICD-10-CM | POA: Diagnosis present

## 2014-12-23 DIAGNOSIS — E119 Type 2 diabetes mellitus without complications: Secondary | ICD-10-CM

## 2014-12-23 DIAGNOSIS — I509 Heart failure, unspecified: Secondary | ICD-10-CM | POA: Diagnosis present

## 2014-12-23 DIAGNOSIS — Z79899 Other long term (current) drug therapy: Secondary | ICD-10-CM | POA: Diagnosis not present

## 2014-12-23 DIAGNOSIS — E249 Cushing's syndrome, unspecified: Secondary | ICD-10-CM | POA: Diagnosis present

## 2014-12-23 DIAGNOSIS — I25110 Atherosclerotic heart disease of native coronary artery with unstable angina pectoris: Secondary | ICD-10-CM | POA: Diagnosis present

## 2014-12-23 DIAGNOSIS — N183 Chronic kidney disease, stage 3 (moderate): Secondary | ICD-10-CM | POA: Diagnosis present

## 2014-12-23 DIAGNOSIS — Z85118 Personal history of other malignant neoplasm of bronchus and lung: Secondary | ICD-10-CM | POA: Diagnosis not present

## 2014-12-23 DIAGNOSIS — J449 Chronic obstructive pulmonary disease, unspecified: Secondary | ICD-10-CM | POA: Diagnosis present

## 2014-12-23 DIAGNOSIS — Z880 Allergy status to penicillin: Secondary | ICD-10-CM | POA: Diagnosis not present

## 2014-12-23 DIAGNOSIS — Z7902 Long term (current) use of antithrombotics/antiplatelets: Secondary | ICD-10-CM | POA: Diagnosis not present

## 2014-12-23 HISTORY — PX: LEFT HEART CATHETERIZATION WITH CORONARY ANGIOGRAM: SHX5451

## 2014-12-23 HISTORY — PX: CORONARY ANGIOPLASTY WITH STENT PLACEMENT: SHX49

## 2014-12-23 HISTORY — PX: PERCUTANEOUS CORONARY STENT INTERVENTION (PCI-S): SHX5485

## 2014-12-23 LAB — CBC
HEMATOCRIT: 35.5 % — AB (ref 36.0–46.0)
HEMOGLOBIN: 11.3 g/dL — AB (ref 12.0–15.0)
MCH: 25.2 pg — AB (ref 26.0–34.0)
MCHC: 31.8 g/dL (ref 30.0–36.0)
MCV: 79.2 fL (ref 78.0–100.0)
Platelets: 199 10*3/uL (ref 150–400)
RBC: 4.48 MIL/uL (ref 3.87–5.11)
RDW: 14.8 % (ref 11.5–15.5)
WBC: 8.8 10*3/uL (ref 4.0–10.5)

## 2014-12-23 LAB — LIPID PANEL
Cholesterol: 202 mg/dL — ABNORMAL HIGH (ref 0–200)
HDL: 28 mg/dL — ABNORMAL LOW (ref >39)
LDL CALC: 107 mg/dL — AB (ref 0–99)
Total CHOL/HDL Ratio: 7.2 RATIO
Triglycerides: 337 mg/dL — ABNORMAL HIGH (ref <150)
VLDL: 67 mg/dL — ABNORMAL HIGH (ref 0–40)

## 2014-12-23 LAB — BASIC METABOLIC PANEL
ANION GAP: 9 (ref 5–15)
BUN: 22 mg/dL (ref 6–23)
CO2: 26 mmol/L (ref 19–32)
CREATININE: 1.41 mg/dL — AB (ref 0.50–1.10)
Calcium: 8.9 mg/dL (ref 8.4–10.5)
Chloride: 102 mmol/L (ref 96–112)
GFR calc non Af Amer: 34 mL/min — ABNORMAL LOW (ref >90)
GFR, EST AFRICAN AMERICAN: 40 mL/min — AB (ref >90)
Glucose, Bld: 116 mg/dL — ABNORMAL HIGH (ref 70–99)
Potassium: 4.4 mmol/L (ref 3.5–5.1)
SODIUM: 137 mmol/L (ref 135–145)

## 2014-12-23 LAB — PROTIME-INR
INR: 1.07 (ref 0.00–1.49)
Prothrombin Time: 14.1 seconds (ref 11.6–15.2)

## 2014-12-23 LAB — CBG MONITORING, ED: Glucose-Capillary: 143 mg/dL — ABNORMAL HIGH (ref 70–99)

## 2014-12-23 LAB — MAGNESIUM: MAGNESIUM: 1.7 mg/dL (ref 1.5–2.5)

## 2014-12-23 LAB — I-STAT TROPONIN, ED: TROPONIN I, POC: 0.03 ng/mL (ref 0.00–0.08)

## 2014-12-23 LAB — BRAIN NATRIURETIC PEPTIDE: B NATRIURETIC PEPTIDE 5: 213.8 pg/mL — AB (ref 0.0–100.0)

## 2014-12-23 LAB — T4, FREE: Free T4: 1.16 ng/dL (ref 0.80–1.80)

## 2014-12-23 LAB — DIGOXIN LEVEL: DIGOXIN LVL: 2.4 ng/mL — AB (ref 0.8–2.0)

## 2014-12-23 LAB — GLUCOSE, CAPILLARY
GLUCOSE-CAPILLARY: 158 mg/dL — AB (ref 70–99)
Glucose-Capillary: 115 mg/dL — ABNORMAL HIGH (ref 70–99)

## 2014-12-23 LAB — TROPONIN I: TROPONIN I: 0.06 ng/mL — AB (ref <0.031)

## 2014-12-23 LAB — TSH: TSH: 1.995 u[IU]/mL (ref 0.350–4.500)

## 2014-12-23 LAB — HEPARIN LEVEL (UNFRACTIONATED): Heparin Unfractionated: <0.1 IU/mL — ABNORMAL LOW (ref 0.30–0.70)

## 2014-12-23 SURGERY — LEFT HEART CATHETERIZATION WITH CORONARY ANGIOGRAM
Anesthesia: LOCAL

## 2014-12-23 MED ORDER — ASPIRIN 81 MG PO CHEW: 81.0000 mg | CHEWABLE_TABLET | ORAL | Status: DC

## 2014-12-23 MED ORDER — SODIUM CHLORIDE 0.9 % IV SOLN: INTRAVENOUS | Status: DC

## 2014-12-23 MED ORDER — HEPARIN (PORCINE) IN NACL 100-0.45 UNIT/ML-% IJ SOLN
850.0000 [IU]/h | INTRAMUSCULAR | Status: DC
  Administered 2014-12-23: 850 [IU]/h via INTRAVENOUS
  Filled 2014-12-23: qty 250

## 2014-12-23 MED ORDER — SODIUM CHLORIDE 0.9 % IV SOLN
INTRAVENOUS | Status: DC
  Administered 2014-12-23: 05:00:00 via INTRAVENOUS

## 2014-12-23 MED ORDER — ATORVASTATIN CALCIUM 80 MG PO TABS
80.0000 mg | ORAL_TABLET | Freq: Every day | ORAL | Status: DC
  Administered 2014-12-23: 80 mg via ORAL
  Filled 2014-12-23 (×4): qty 1

## 2014-12-23 MED ORDER — FENTANYL CITRATE (PF) 100 MCG/2ML IJ SOLN
INTRAMUSCULAR | Status: AC
  Filled 2014-12-23: qty 2

## 2014-12-23 MED ORDER — HEPARIN SODIUM (PORCINE) 1000 UNIT/ML IJ SOLN
INTRAMUSCULAR | Status: AC
  Filled 2014-12-23: qty 1

## 2014-12-23 MED ORDER — ACETAMINOPHEN 325 MG PO TABS
650.0000 mg | ORAL_TABLET | ORAL | Status: DC | PRN
  Administered 2014-12-23: 650 mg via ORAL
  Filled 2014-12-23 (×2): qty 2

## 2014-12-23 MED ORDER — METOPROLOL TARTRATE 25 MG PO TABS
50.0000 mg | ORAL_TABLET | Freq: Two times a day (BID) | ORAL | Status: DC
  Administered 2014-12-23 – 2014-12-24 (×3): 50 mg via ORAL
  Filled 2014-12-23 (×4): qty 2

## 2014-12-23 MED ORDER — SODIUM CHLORIDE 0.9 % IJ SOLN: 3.0000 mL | Freq: Two times a day (BID) | INTRAMUSCULAR | Status: DC

## 2014-12-23 MED ORDER — HEPARIN BOLUS VIA INFUSION
3000.0000 [IU] | Freq: Once | INTRAVENOUS | Status: AC
  Administered 2014-12-23: 3000 [IU] via INTRAVENOUS
  Filled 2014-12-23: qty 3000

## 2014-12-23 MED ORDER — NITROGLYCERIN 0.4 MG SL SUBL: 0.4000 mg | SUBLINGUAL_TABLET | SUBLINGUAL | Status: DC | PRN

## 2014-12-23 MED ORDER — LIVING WELL WITH DIABETES BOOK
Freq: Once | Status: AC
  Administered 2014-12-23: 17:00:00
  Filled 2014-12-23: qty 1

## 2014-12-23 MED ORDER — CLOPIDOGREL BISULFATE 75 MG PO TABS
75.0000 mg | ORAL_TABLET | Freq: Every day | ORAL | Status: DC
  Administered 2014-12-24: 09:00:00 75 mg via ORAL
  Filled 2014-12-23: qty 1

## 2014-12-23 MED ORDER — SODIUM CHLORIDE 0.9 % IV SOLN
INTRAVENOUS | Status: AC
  Administered 2014-12-23: 13:00:00 via INTRAVENOUS

## 2014-12-23 MED ORDER — LIDOCAINE HCL (PF) 1 % IJ SOLN
INTRAMUSCULAR | Status: AC
  Filled 2014-12-23: qty 30

## 2014-12-23 MED ORDER — ASPIRIN EC 81 MG PO TBEC
81.0000 mg | DELAYED_RELEASE_TABLET | Freq: Every day | ORAL | Status: DC
  Administered 2014-12-23 – 2014-12-24 (×2): 81 mg via ORAL
  Filled 2014-12-23 (×2): qty 1

## 2014-12-23 MED ORDER — MIDAZOLAM HCL 2 MG/2ML IJ SOLN
INTRAMUSCULAR | Status: AC
  Filled 2014-12-23: qty 2

## 2014-12-23 MED ORDER — HEPARIN (PORCINE) IN NACL 2-0.9 UNIT/ML-% IJ SOLN
INTRAMUSCULAR | Status: AC
  Filled 2014-12-23: qty 1500

## 2014-12-23 MED ORDER — VERAPAMIL HCL 2.5 MG/ML IV SOLN
INTRAVENOUS | Status: AC
  Filled 2014-12-23: qty 2

## 2014-12-23 MED ORDER — CLOPIDOGREL BISULFATE 300 MG PO TABS
ORAL_TABLET | ORAL | Status: AC
  Filled 2014-12-23: qty 2

## 2014-12-23 MED ORDER — SODIUM CHLORIDE 0.9 % IJ SOLN: 3.0000 mL | INTRAMUSCULAR | Status: DC | PRN

## 2014-12-23 MED ORDER — ONDANSETRON HCL 4 MG/2ML IJ SOLN
4.0000 mg | Freq: Four times a day (QID) | INTRAMUSCULAR | Status: DC | PRN
  Administered 2014-12-23: 4 mg via INTRAVENOUS
  Filled 2014-12-23: qty 2

## 2014-12-23 MED ORDER — SODIUM CHLORIDE 0.9 % IV SOLN: 250.0000 mL | INTRAVENOUS | Status: DC | PRN

## 2014-12-23 NOTE — Progress Notes (Signed)
TR BAND REMOVAL  LOCATION:  right radial  DEFLATED PER PROTOCOL:  Yes.    TIME BAND OFF / DRESSING APPLIED:   1730  SITE UPON ARRIVAL:   Level 0  SITE AFTER BAND REMOVAL:  Level 0  REVERSE ALLEN'S TEST:    positive  CIRCULATION SENSATION AND MOVEMENT:  Within Normal Limits  Yes.    COMMENTS:

## 2014-12-23 NOTE — Progress Notes (Signed)
ANTICOAGULATION CONSULT NOTE - Initial Consult  Pharmacy Consult for Heparin  Indication: chest pain/ACS  Allergies  Allergen Reactions  . Ciprofloxacin Shortness Of Breath, Itching and Rash  . Doxycycline Shortness Of Breath, Itching and Rash  . Penicillins Shortness Of Breath, Itching and Rash  . Sulfa Antibiotics Shortness Of Breath, Itching and Rash  . Morphine And Related Itching    Patient Measurements: ~75 kg  Vital Signs: Temp: 99.1 F (37.3 C) (04/26 2302) Temp Source: Oral (04/26 2302) BP: 144/65 mmHg (04/26 2335) Pulse Rate: 86 (04/26 2335)  Labs:  Recent Labs  12/22/14 2309  HGB 11.7*  HCT 36.1  PLT 204  CREATININE 1.38*    Medical History: Past Medical History  Diagnosis Date  . COPD (chronic obstructive pulmonary disease)   . Diabetes mellitus without complication   . CHF (congestive heart failure)   . Hypertension   . Cancer   . Lung cancer    Assessment: 81 y/o F with CP to start heparin, Hgb 11.7, mild bump in Scr, first troponin is negative, other labs as above.   Goal of Therapy:  Heparin level 0.3-0.7 units/ml Monitor platelets by anticoagulation protocol: Yes   Plan:  -Heparin 3000 units BOLUS -Start heparin drip at 850 units/hr -0900 HL -Daily CBC/HL -Monitor for bleeding  Narda Bonds 12/23/2014,12:14 AM

## 2014-12-23 NOTE — Interval H&P Note (Signed)
History and Physical Interval Note:  12/23/2014 11:15 AM  Regina Burke  has presented today for cardiac cath with the diagnosis of chest pain/unstable angina. The various methods of treatment have been discussed with the patient and family. After consideration of risks, benefits and other options for treatment, the patient has consented to  Procedure(s): LEFT HEART CATHETERIZATION WITH CORONARY ANGIOGRAM (N/A) as a surgical intervention .  The patient's history has been reviewed, patient examined, no change in status, stable for surgery.  I have reviewed the patient's chart and labs.  Questions were answered to the patient's satisfaction.    Cath Lab Visit (complete for each Cath Lab visit)  Clinical Evaluation Leading to the Procedure:   ACS: No.  Non-ACS:    Anginal Classification: CCS III  Anti-ischemic medical therapy: No Therapy  Non-Invasive Test Results: No non-invasive testing performed  Prior CABG: No previous CABG         [name]

## 2014-12-23 NOTE — Progress Notes (Addendum)
Consult received for noncompliance.   RN: Please start educational videos by dialing [phone] and accessing patient education network. Please teach patient how to draw up and administer insulin by vial and syringe. Also let patient check their own glucose tonight and in the morning. Dietitian consult ordered for dietary teaching. Videos listed in order. Living well with diabetes book ordered.   Patient is on Metformin 500 mg BID at home.  Noted A1c is in process. It is hard to gauge how noncompliant patient is.   Will follow and see first thing in am Thanks,  Tama Headings RN, MSN, [facility] Inpatient Diabetes Coordinator Team Pager [phone]

## 2014-12-23 NOTE — CV Procedure (Signed)
Cardiac Catheterization Operative Report  Regina Burke [id number] 4/27/201611:16 AM PROVIDER NOT IN SYSTEM  Procedure Performed:  1. Left Heart Catheterization 2. Selective Coronary Angiography 3. PTCA/DES x 1 OM3  Operator: Lauree Chandler, MD  Arterial access site:  Right radial artery.   Indication:  81 yo female with history of atrial fibrillation, COPD, lung cancer, DM, HTN admitted with chest pain and tachycardia. Code STEMI called last night but cancelled after evaluation by Dr. Claiborne Billings in the ED as EKG was not diagnostic and pt was chest pain free. Cardiac cath this am to exclude obstructive CAD.                                      Procedure Details: The risks, benefits, complications, treatment options, and expected outcomes were discussed with the patient. The patient and/or family concurred with the proposed plan, giving informed consent. The patient was brought to the cath lab after IV hydration was begun and oral premedication was given. The patient was further sedated with Versed and Fentanyl. The right wrist was assessed with a modified Allens test which was positive. The right wrist was prepped and draped in a sterile fashion. 1% lidocaine was used for local anesthesia. Using the modified Seldinger access technique, a 5 French sheath was placed in the right radial artery. 3 mg Verapamil was given through the sheath. 4000 units IV heparin was given. Standard diagnostic catheters were used to perform selective coronary angiography. A pigtail catheter was used to cross the aortic valve and measure LV pressures. She was found to have severe stenosis in the third OM branch. I elected to proceed to PCI of OM3.   PCI Note: She was given an additional 6000 units IV heparin. She was given 600 mg Plavix po x 1. I then engaged the left main with a XB 3.0 guiding catheter. When the ACT was over 200, I passed a Cougar IC wire down the Circumflex into OM3. I then deployed a  2.25 x 8 mm Promus Premier DES in the ostium of OM3 covering the entire lesion. The stent was post-dilated with a 2.5 x 8 mm [place] balloon x 1. The stenosis was taken from 90% down to 0%. There was an excellent angiographic result with TIMI-3 flow down the vessel both before and after the PCI.   The sheath was removed from the right radial artery and a Terumo hemostasis band was applied at the arteriotomy site on the right wrist. There were no immediate complications. The patient was taken to the recovery area in stable condition.   EBL: minimal   Hemodynamic Findings: Central aortic pressure: 164/57 Left ventricular pressure: 173/10/20  Angiographic Findings:  Left main: Short segment. No obstructive disease.   Left Anterior Descending Artery: Large caliber vessel that courses to the apex. The proximal vessel has diffuse mild plaque. There is a 50% stenosis in the mid vessel just before the takeoff of the first diagonal branch. The remainder of the mid vessel has mild diffuse plaque. The distal vessel has mild diffuse plaque. The first diagonal branch is a moderate caliber vessel with mid 30% stenosis. The second major diagonal branch is a small caliber vessel with proximal 40% stenosis.   Circumflex Artery: Large caliber vessel with 3 obtuse marginal branches. The AV groove Circumflex has mild plaque in the proximal and mid segments. The first obtuse marginal branch is  a moderate caliber vessel with proximal 20% stenosis. The second obtuse marginal branch is a moderate caliber vessel with mid 20% stenosis. The third obtuse marginal branch is a moderate caliber vessel with proximal 90% hazy stenosis. The mid segment of OM3 has a 50% stenosis.   Right Coronary Artery: Moderate caliber non-dominant vessel with no obstructive disease.   Left Ventricular Angiogram: Deferred.   Impression: 1. Severe single vessel CAD 2. Severe stenosis OM3 3. Unstable angina 4. Successful PTCA/DES x 1  OM3  Recommendations: She will need dual anti-platelet therapy with ASA and Plavix for at least 12 months. Continue beta blocker and statin.        Complications:  None. The patient tolerated the procedure well.

## 2014-12-23 NOTE — H&P (View-Only) (Signed)
Patient Name: Regina Burke Date of Encounter: 12/23/2014  Principal Problem:   ACS (acute coronary syndrome) Active Problems:   Diabetes mellitus without complication   Hypertension    Primary Cardiologist: New (PCP Dr. Dow Adolph)  Patient Profile: 81 yo female w/ hx of non-small cell lung CA s/p XRT, DM, HTN, COPD, CKD, Cushing's Syndrome, and left adrenal adenoma admitted 04/26 with chest pain and SOB. Initially called in as code STEMI. Chest pain free on arrival and on review of ECG did not meet criteria for STEMI.  SUBJECTIVE: No complaints this morning. Denies chest pain, palpitations, shortness of breath, nausea, diaphoresis.   States her chest pain yesterday lasted approximately 30 minutes and felt like someone was "beating her in the chest". Reported palpitations, nausea, and diaphoresis at that time. States this occurred once last week as well, but she did not come to the ED.   OBJECTIVE Filed Vitals:   12/23/14 0600 12/23/14 0630 12/23/14 0700 12/23/14 0730  BP: 131/49 140/51 132/48 133/49  Pulse: 53 53 51 51  Temp:      TempSrc:      Resp: '17 15 14 16  '$ SpO2: 99% 99% 99% 98%    Intake/Output Summary (Last 24 hours) at 12/23/14 0955 Last data filed at 12/22/14 2347  Gross per 24 hour  Intake      0 ml  Output    250 ml  Net   -250 ml   There were no vitals filed for this visit.  PHYSICAL EXAM General: Well developed, pleasant female in no acute distress. Head: Normocephalic, atraumatic.  Neck: Supple without bruits, JVD not elevated. Lungs: Resp regular and unlabored, CTA but diminished air movement and prolonged expiration Heart: RRR, S1, S2, no S3, S4, 2/6 harsh early peaking systolic murmur at the RUSB Abdomen: Soft, non-tender, non-distended, BS + x 4.  Extremities: No clubbing, cyanosis, no edema.  Neuro: Alert and oriented X 3. Moves all extremities spontaneously. Psych: Normal affect.  LABS: CBC:  Recent Labs  12/22/14 2309 12/23/14 0434    WBC 9.0 8.8  HGB 11.7* 11.3*  HCT 36.1 35.5*  MCV 78.5 79.2  PLT 204 199   INR:  Recent Labs  12/23/14 0434  INR 6.64   Basic Metabolic Panel:  Recent Labs  12/22/14 2309 12/23/14 0434  NA 137 137  K 4.2 4.4  CL 99 102  CO2 24 26  GLUCOSE 258* 116*  BUN 20 22  CREATININE 1.38* 1.41*  CALCIUM 9.0 8.9  MG  --  1.7    Recent Labs  12/22/14 2313 12/23/14 0437  TROPIPOC 0.00 0.03   BNP:  B NATRIURETIC PEPTIDE  Date/Time Value Ref Range Status  12/23/2014 04:34 AM 213.8* 0.0 - 100.0 pg/mL Final   Fasting Lipid Panel:  Recent Labs  12/23/14 0434  CHOL 202*  HDL 28*  LDLCALC 107*  TRIG 337*  CHOLHDL 7.2   Thyroid Function Tests:  Recent Labs  12/23/14 0434  TSH 1.995    TELE: SR, 55-70, occasional PVCs      ECG: SR, HR 59, LVH   Radiology/Studies: Dg Chest Port 1 View 12/22/2014   CLINICAL DATA:  Code ST-elevation myocardial infarction. Initial encounter.  EXAM: PORTABLE CHEST - 1 VIEW  COMPARISON:  None.  FINDINGS: The lungs are well-aerated. Apparent scarring is noted at the right lung apex, with associated postoperative change. There is no evidence of focal opacification, pleural effusion or pneumothorax.  The cardiomediastinal silhouette is within normal  limits. No acute osseous abnormalities are seen.  IMPRESSION: Apparent scarring at the right lung apex, with associated postoperative change.   Electronically Signed   By: Garald Balding M.D.   On: 12/22/2014 23:47   Current Medications:  . aspirin  324 mg Oral Once  . aspirin EC  81 mg Oral Daily  . atorvastatin  80 mg Oral q1800  . metoprolol tartrate  50 mg Oral BID   . sodium chloride 50 mL/hr at 12/23/14 0441  . heparin 850 Units/hr (12/23/14 0035)   Medication Sig  cholecalciferol (VITAMIN D) 400 UNITS tablet Take 400 Units by mouth daily.  cyanocobalamin 500 MCG tablet Take 500 mcg by mouth daily.  digoxin (LANOXIN) 0.25 MG tablet Take 0.25 mg by mouth daily.  diltiazem (DILACOR XR)  180 MG 24 hr capsule Take 180 mg by mouth daily.  febuxostat (ULORIC) 40 MG tablet Take 40 mg by mouth daily.   fenofibrate (TRICOR) 48 MG tablet Take 48 mg by mouth daily.  ferrous sulfate 325 (65 FE) MG EC tablet Take 325 mg by mouth daily with breakfast.   metFORMIN (GLUCOPHAGE) 500 MG tablet Take 500 mg by mouth 2 (two) times daily with a meal.  metoprolol (LOPRESSOR) 50 MG tablet Take 50 mg by mouth 2 (two) times daily.  ondansetron (ZOFRAN) 4 MG tablet Take 4 mg by mouth every 8 (eight) hours.  sertraline (ZOLOFT) 25 MG tablet Take 25 mg by mouth daily.  valsartan (DIOVAN) 160 MG tablet Take 160 mg by mouth daily.  vitamin C (ASCORBIC ACID) 500 MG tablet Take 500 mg by mouth daily.    ASSESSMENT AND PLAN: Primary Problem:   ACS (acute coronary syndrome) - troponin negative x 2 , 3rd pending  - continue ASA, BB, statin, heparin, NTG PRN - echo ordered for this AM - both episodes started after exertion, started with SOB - pounding feeling, may be 2nd arrhythmia    Active Problems:   DM - add SSI    HTN - SBP 130s-140s on current rx    Palpitations - hx of, dig added after ER visit for same ?2014 - was put on coumadin after that, probably afib - palpitations had resolved before arrival at ER, EMS ECG should show them, cannot find so far - records from [facility] pending    DOE - chronic, started after Rx for lung CA - can only walk about 20 feet without being very SOB - CXR OK & exam OK today  Signed, Sherren Mocha , PA-C 9:55 AM 12/23/2014  Patient seen, examined. Available data reviewed. Agree with findings, assessment, and plan as outlined by Rosaria Ferries, PA-C. The patient is independently interviewed and examined. History is a bit unclear as she has been on warfarin for presumed atrial fibrillation, but no longer taking this. She presented last night with chest pain and tachycardia palpitations. The patient has a history of stage I non-small cell lung cancer that has  been treated with local radiation therapy. Her last treatment was in 2015. She has chronic dyspnea related to long-standing COPD. The patient has not required home oxygen. I have personally reviewed her chest CT images that were done as part of a staging PET scan. She does have coronary calcification. The patient has symptoms of chest pain/pressure with exertion, highly suggestive of angina. While her cardiac markers are negative and EKG nondiagnostic because of incomplete left bundle branch block with repolarization abnormality, I think with multiple risk factors, exertional angina with low-level activity, and  resting chest pain that occurred yesterday when her heart rate was about 100 bpm, there is a high probability of obstructive CAD. I have recommended cardiac catheterization and possible PCI. I have specifically discussed potential treatment options with her. I do not think she would be a candidate for cardiac surgery at age 86 with severe COPD. However, if she has obstructive CAD treatable with PCI, that would be a reasonable option to improve her symptoms. Will also check a 2D Echo to evaluate her heart murmur which I suspect is an aortic valve murmur.  I have reviewed the risks, indications, and alternatives to cardiac catheterization and PCI with the patient. Risks include but are not limited to bleeding, infection, vascular injury, stroke, myocardial infection, arrhythmia, kidney injury, radiation-related injury in the case of prolonged fluoroscopy use, emergency cardiac surgery, and death. The patient understands the risks of serious complication is low (<3%).   Sherren Mocha, M.D. 12/23/2014 9:55 AM

## 2014-12-23 NOTE — ED Notes (Signed)
Patient taken off of nasal cannula, desaturated down to 91% on room air.  Placed back on 2L with an O2 of 96%.

## 2014-12-23 NOTE — Progress Notes (Signed)
Patient Name: Regina Burke Date of Encounter: 12/23/2014  Principal Problem:   ACS (acute coronary syndrome) Active Problems:   Diabetes mellitus without complication   Hypertension    Primary Cardiologist: New (PCP Dr. Dow Adolph)  Patient Profile: 81 yo female w/ hx of non-small cell lung CA s/p XRT, DM, HTN, COPD, CKD, Cushing's Syndrome, and left adrenal adenoma admitted 04/26 with chest pain and SOB. Initially called in as code STEMI. Chest pain free on arrival and on review of ECG did not meet criteria for STEMI.  SUBJECTIVE: No complaints this morning. Denies chest pain, palpitations, shortness of breath, nausea, diaphoresis.   States her chest pain yesterday lasted approximately 30 minutes and felt like someone was "beating her in the chest". Reported palpitations, nausea, and diaphoresis at that time. States this occurred once last week as well, but she did not come to the ED.   OBJECTIVE Filed Vitals:   12/23/14 0600 12/23/14 0630 12/23/14 0700 12/23/14 0730  BP: 131/49 140/51 132/48 133/49  Pulse: 53 53 51 51  Temp:      TempSrc:      Resp: '17 15 14 16  '$ SpO2: 99% 99% 99% 98%    Intake/Output Summary (Last 24 hours) at 12/23/14 0955 Last data filed at 12/22/14 2347  Gross per 24 hour  Intake      0 ml  Output    250 ml  Net   -250 ml   There were no vitals filed for this visit.  PHYSICAL EXAM General: Well developed, pleasant female in no acute distress. Head: Normocephalic, atraumatic.  Neck: Supple without bruits, JVD not elevated. Lungs: Resp regular and unlabored, CTA but diminished air movement and prolonged expiration Heart: RRR, S1, S2, no S3, S4, 2/6 harsh early peaking systolic murmur at the RUSB Abdomen: Soft, non-tender, non-distended, BS + x 4.  Extremities: No clubbing, cyanosis, no edema.  Neuro: Alert and oriented X 3. Moves all extremities spontaneously. Psych: Normal affect.  LABS: CBC:  Recent Labs  12/22/14 2309 12/23/14 0434    WBC 9.0 8.8  HGB 11.7* 11.3*  HCT 36.1 35.5*  MCV 78.5 79.2  PLT 204 199   INR:  Recent Labs  12/23/14 0434  INR 8.78   Basic Metabolic Panel:  Recent Labs  12/22/14 2309 12/23/14 0434  NA 137 137  K 4.2 4.4  CL 99 102  CO2 24 26  GLUCOSE 258* 116*  BUN 20 22  CREATININE 1.38* 1.41*  CALCIUM 9.0 8.9  MG  --  1.7    Recent Labs  12/22/14 2313 12/23/14 0437  TROPIPOC 0.00 0.03   BNP:  B NATRIURETIC PEPTIDE  Date/Time Value Ref Range Status  12/23/2014 04:34 AM 213.8* 0.0 - 100.0 pg/mL Final   Fasting Lipid Panel:  Recent Labs  12/23/14 0434  CHOL 202*  HDL 28*  LDLCALC 107*  TRIG 337*  CHOLHDL 7.2   Thyroid Function Tests:  Recent Labs  12/23/14 0434  TSH 1.995    TELE: SR, 55-70, occasional PVCs      ECG: SR, HR 59, LVH   Radiology/Studies: Dg Chest Port 1 View 12/22/2014   CLINICAL DATA:  Code ST-elevation myocardial infarction. Initial encounter.  EXAM: PORTABLE CHEST - 1 VIEW  COMPARISON:  None.  FINDINGS: The lungs are well-aerated. Apparent scarring is noted at the right lung apex, with associated postoperative change. There is no evidence of focal opacification, pleural effusion or pneumothorax.  The cardiomediastinal silhouette is within normal  limits. No acute osseous abnormalities are seen.  IMPRESSION: Apparent scarring at the right lung apex, with associated postoperative change.   Electronically Signed   By: Garald Balding M.D.   On: 12/22/2014 23:47   Current Medications:  . aspirin  324 mg Oral Once  . aspirin EC  81 mg Oral Daily  . atorvastatin  80 mg Oral q1800  . metoprolol tartrate  50 mg Oral BID   . sodium chloride 50 mL/hr at 12/23/14 0441  . heparin 850 Units/hr (12/23/14 0035)   Medication Sig  cholecalciferol (VITAMIN D) 400 UNITS tablet Take 400 Units by mouth daily.  cyanocobalamin 500 MCG tablet Take 500 mcg by mouth daily.  digoxin (LANOXIN) 0.25 MG tablet Take 0.25 mg by mouth daily.  diltiazem (DILACOR XR)  180 MG 24 hr capsule Take 180 mg by mouth daily.  febuxostat (ULORIC) 40 MG tablet Take 40 mg by mouth daily.   fenofibrate (TRICOR) 48 MG tablet Take 48 mg by mouth daily.  ferrous sulfate 325 (65 FE) MG EC tablet Take 325 mg by mouth daily with breakfast.   metFORMIN (GLUCOPHAGE) 500 MG tablet Take 500 mg by mouth 2 (two) times daily with a meal.  metoprolol (LOPRESSOR) 50 MG tablet Take 50 mg by mouth 2 (two) times daily.  ondansetron (ZOFRAN) 4 MG tablet Take 4 mg by mouth every 8 (eight) hours.  sertraline (ZOLOFT) 25 MG tablet Take 25 mg by mouth daily.  valsartan (DIOVAN) 160 MG tablet Take 160 mg by mouth daily.  vitamin C (ASCORBIC ACID) 500 MG tablet Take 500 mg by mouth daily.    ASSESSMENT AND PLAN: Primary Problem:   ACS (acute coronary syndrome) - troponin negative x 2 , 3rd pending  - continue ASA, BB, statin, heparin, NTG PRN - echo ordered for this AM - both episodes started after exertion, started with SOB - pounding feeling, may be 2nd arrhythmia    Active Problems:   DM - add SSI    HTN - SBP 130s-140s on current rx    Palpitations - hx of, dig added after ER visit for same ?2014 - was put on coumadin after that, probably afib - palpitations had resolved before arrival at ER, EMS ECG should show them, cannot find so far - records from [facility] pending    DOE - chronic, started after Rx for lung CA - can only walk about 20 feet without being very SOB - CXR OK & exam OK today  Signed, Sherren Mocha , PA-C 9:55 AM 12/23/2014  Patient seen, examined. Available data reviewed. Agree with findings, assessment, and plan as outlined by Rosaria Ferries, PA-C. The patient is independently interviewed and examined. History is a bit unclear as she has been on warfarin for presumed atrial fibrillation, but no longer taking this. She presented last night with chest pain and tachycardia palpitations. The patient has a history of stage I non-small cell lung cancer that has  been treated with local radiation therapy. Her last treatment was in 2015. She has chronic dyspnea related to long-standing COPD. The patient has not required home oxygen. I have personally reviewed her chest CT images that were done as part of a staging PET scan. She does have coronary calcification. The patient has symptoms of chest pain/pressure with exertion, highly suggestive of angina. While her cardiac markers are negative and EKG nondiagnostic because of incomplete left bundle branch block with repolarization abnormality, I think with multiple risk factors, exertional angina with low-level activity, and  resting chest pain that occurred yesterday when her heart rate was about 100 bpm, there is a high probability of obstructive CAD. I have recommended cardiac catheterization and possible PCI. I have specifically discussed potential treatment options with her. I do not think she would be a candidate for cardiac surgery at age 46 with severe COPD. However, if she has obstructive CAD treatable with PCI, that would be a reasonable option to improve her symptoms. Will also check a 2D Echo to evaluate her heart murmur which I suspect is an aortic valve murmur.  I have reviewed the risks, indications, and alternatives to cardiac catheterization and PCI with the patient. Risks include but are not limited to bleeding, infection, vascular injury, stroke, myocardial infection, arrhythmia, kidney injury, radiation-related injury in the case of prolonged fluoroscopy use, emergency cardiac surgery, and death. The patient understands the risks of serious complication is low (<5%).   Sherren Mocha, M.D. 12/23/2014 9:55 AM

## 2014-12-23 NOTE — Progress Notes (Signed)
ANTICOAGULATION CONSULT NOTE - Follow-up Consult  Pharmacy Consult for Heparin  Indication: chest pain/ACS  Allergies  Allergen Reactions  . Ciprofloxacin Shortness Of Breath, Itching and Rash  . Doxycycline Shortness Of Breath, Itching and Rash  . Penicillins Shortness Of Breath, Itching and Rash  . Sulfa Antibiotics Shortness Of Breath, Itching and Rash  . Morphine And Related Itching    Patient Measurements: Wt: ~75 kg  Vital Signs: Temp: 99.1 F (37.3 C) (04/26 2302) Temp Source: Oral (04/26 2302) BP: 155/54 mmHg (04/27 1000) Pulse Rate: 58 (04/27 1000)  Labs:  Recent Labs  12/22/14 2309 12/23/14 0434 12/23/14 0854  HGB 11.7* 11.3*  --   HCT 36.1 35.5*  --   PLT 204 199  --   LABPROT  --  14.1  --   INR  --  1.07  --   HEPARINUNFRC  --   --  <0.10*  CREATININE 1.38* 1.41*  --   TROPONINI  --   --  0.06*    Assessment: 81 y/o F on heparin for CP. Trop 0.06. H/H stable. No bleeding noted. Heparin level undetectable on 850 units/hr. No issues with line per RN but pt is headed to cath lab right now so will not adjust heparin at this time.  Goal of Therapy:  Heparin level 0.3-0.7 units/ml Monitor platelets by anticoagulation protocol: Yes   Plan:  - F/u post cath  Sherlon Handing, PharmD, BCPS Clinical pharmacist, pager [phone] 12/23/2014,10:35 AM

## 2014-12-23 NOTE — ED Notes (Signed)
Echo at bedside

## 2014-12-23 NOTE — Care Management Note (Signed)
    Page 1 of 1   12/24/2014     2:36:56 PM CARE MANAGEMENT NOTE 12/24/2014  Patient:  Regina Burke, Regina Burke   Account Number:  [id number]  Date Initiated:  12/23/2014  Documentation initiated by:  COLE,ANGELA  Subjective/Objective Assessment:   From home with family admitted with ACS.     Action/Plan:   Return home when medically stable. CM to f/u with d/c needs.   Anticipated DC Date:  12/24/2014   Anticipated DC Plan:  [place]  CM consult      Choice offered to / List presented to:  C-1 Patient   DME arranged  [facility]      DME agency  [place].        Status of service:  Completed, signed off Medicare Important Message given?  NA - LOS <3 / Initial given by admissions (If response is "NO", the following Medicare IM given date fields will be blank) Date Medicare IM given:   Medicare IM given by:   Date Additional Medicare IM given:   Additional Medicare IM given by:    Discharge Disposition:  HOME/SELF CARE  Per UR Regulation:  Reviewed for med. necessity/level of care/duration of stay  If discussed at [place] of Stay Meetings, dates discussed:    Comments:  12/24/2014 @ 11:45 Regina Hero RN,BSN,CM [phone] CM consulted for home 02, referral made with [facility]. Regina Burke [phone]) . [name] is to deliver 02 to pt's room prior to d/c. No other needs identified.

## 2014-12-23 NOTE — Progress Notes (Signed)
  Echocardiogram 2D Echocardiogram has been performed.  Darlina Sicilian M 12/23/2014, 10:39 AM

## 2014-12-24 DIAGNOSIS — I249 Acute ischemic heart disease, unspecified: Secondary | ICD-10-CM

## 2014-12-24 DIAGNOSIS — I10 Essential (primary) hypertension: Secondary | ICD-10-CM

## 2014-12-24 DIAGNOSIS — E119 Type 2 diabetes mellitus without complications: Secondary | ICD-10-CM

## 2014-12-24 LAB — HEPATIC FUNCTION PANEL
ALT: 13 U/L (ref 0–35)
AST: 25 U/L (ref 0–37)
Albumin: 3.2 g/dL — ABNORMAL LOW (ref 3.5–5.2)
Alkaline Phosphatase: 38 U/L — ABNORMAL LOW (ref 39–117)
Bilirubin, Direct: 0.1 mg/dL (ref 0.0–0.5)
Indirect Bilirubin: 0.4 mg/dL (ref 0.3–0.9)
TOTAL PROTEIN: 5.8 g/dL — AB (ref 6.0–8.3)
Total Bilirubin: 0.5 mg/dL (ref 0.3–1.2)

## 2014-12-24 LAB — BASIC METABOLIC PANEL
ANION GAP: 9 (ref 5–15)
BUN: 21 mg/dL (ref 6–23)
CALCIUM: 8.6 mg/dL (ref 8.4–10.5)
CO2: 28 mmol/L (ref 19–32)
Chloride: 100 mmol/L (ref 96–112)
Creatinine, Ser: 1.48 mg/dL — ABNORMAL HIGH (ref 0.50–1.10)
GFR calc Af Amer: 38 mL/min — ABNORMAL LOW (ref >90)
GFR calc non Af Amer: 32 mL/min — ABNORMAL LOW (ref >90)
GLUCOSE: 135 mg/dL — AB (ref 70–99)
POTASSIUM: 4.7 mmol/L (ref 3.5–5.1)
SODIUM: 137 mmol/L (ref 135–145)

## 2014-12-24 LAB — HEMOGLOBIN A1C
HEMOGLOBIN A1C: 6.6 % — AB (ref 4.8–5.6)
MEAN PLASMA GLUCOSE: 143 mg/dL

## 2014-12-24 LAB — GLUCOSE, CAPILLARY: Glucose-Capillary: 137 mg/dL — ABNORMAL HIGH (ref 70–99)

## 2014-12-24 LAB — POCT ACTIVATED CLOTTING TIME: ACTIVATED CLOTTING TIME: 239 s

## 2014-12-24 MED ORDER — LIVING WELL WITH DIABETES BOOK
Freq: Once | Status: DC
  Filled 2014-12-24: qty 1

## 2014-12-24 MED ORDER — NITROGLYCERIN 0.4 MG SL SUBL: 0.4000 mg | SUBLINGUAL_TABLET | SUBLINGUAL | Status: AC | PRN

## 2014-12-24 MED ORDER — CLOPIDOGREL BISULFATE 75 MG PO TABS: 75.0000 mg | ORAL_TABLET | Freq: Every day | ORAL | Status: DC

## 2014-12-24 MED ORDER — METOPROLOL SUCCINATE ER 100 MG PO TB24: 100.0000 mg | ORAL_TABLET | Freq: Every day | ORAL | Status: DC

## 2014-12-24 NOTE — Progress Notes (Signed)
Patients phone not in service call daughter for appointments and as contact person at this time/ Marge Duncans # [phone].

## 2014-12-24 NOTE — Progress Notes (Signed)
SATURATION QUALIFICATIONS: (This note is used to comply with regulatory documentation for home oxygen)  Patient Saturations on Room Air at Rest = 85 %  Patient Saturations on Room Air while Ambulating = 87%  Patient Saturations on 2 Liters of oxygen while Ambulating = 91 %  Please briefly explain why patient needs home oxygen:  Pt requiring O2 to maintain sats at rest and with ambulation.

## 2014-12-24 NOTE — Progress Notes (Signed)
CARDIAC REHAB PHASE I   PRE:  Rate/Rhythm: 79 SR  BP:  Sitting: 157/48        SaO2: 94 RA  MODE:  Ambulation: 500 ft   POST:  Rate/Rhythm: 89 SR  BP:  Sitting: 165/43         SaO2: 85 RA, 95 on 2L, see walking O2 sat note-ambulated on RA dropped to 87, 91 on 2L ambulating  Pt ambulated 250 ft on RA, handheld assist, walker, gait belt, steady gait, tolerated well.  Pt denies DOE, cp, dizziness, declined rest stop. Pt O2 sats on return to room 85% on RA.  Pt states she feels SOB often at home with short distances, has hx COPD, walkingO2 sat documented, pt 87 ambulating on RA, increased to 91 on 2L. Pt qualifies for home O2, P.A., Rosaria Ferries, and RN notified. Completed stent education. Reviewed anti-platelet therapy, stent card, activity restrictions, ntg, exercise, heart healthy diet, carb counting, portion control, phase 2 cardiac rehab. Pt verbalized understanding. Pt agrees to phase 2 cardiac rehab. Will send referral to [facility].    [id number]   Lenna Sciara, RN, BSN 12/24/2014 9:20 AM

## 2014-12-24 NOTE — Progress Notes (Signed)
Patient Name: Regina Burke Date of Encounter: 12/24/2014  Principal Problem:   ACS (acute coronary syndrome) Active Problems:   Diabetes mellitus without complication   Hypertension   Unstable angina   Primary Cardiologist: New, will be Dr. Burt Knack  Patient Profile: 81 year old female with a history of non-small cell lung CA s/p XRT, DM, HTN, COPD, CKD, Cushing's Syndrome, and left adrenal adenoma admitted 04/26 with chest pain and SOB. Cath 04/26 with PCI to the OM 3  SUBJECTIVE: No chest pain or shortness of breath overnight, no palpitations or heart pounding.  OBJECTIVE Filed Vitals:   12/23/14 2131 12/24/14 0008 12/24/14 0611 12/24/14 0640  BP: 154/46 142/42 188/62 160/34  Pulse: 67 70 68   Temp:  98.4 F (36.9 C) 98 F (36.7 C)   TempSrc:  Oral Oral   Resp:  '15 17 18  '$ Height:      Weight:  166 lb 3.6 oz (75.4 kg)    SpO2:  94% 92%     Intake/Output Summary (Last 24 hours) at 12/24/14 0713 Last data filed at 12/24/14 0400  Gross per 24 hour  Intake    590 ml  Output    801 ml  Net   -211 ml   Filed Weights   12/23/14 1036 12/24/14 0008  Weight: 166 lb (75.297 kg) 166 lb 3.6 oz (75.4 kg)    PHYSICAL EXAM General: Well developed, well nourished, female in no acute distress. Head: Normocephalic, atraumatic.  Neck: Supple without bruits, JVD not elevated. Lungs:  Resp regular and unlabored, some dry rales, good air exchange. Heart: RRR, S1, S2, no S3, S4, 2/6 murmur; no rub. Abdomen: Soft, non-tender, non-distended, BS + x 4.  Extremities: No clubbing, cyanosis, no edema. Right radial cath site without hematoma, minimal ecchymosis Neuro: Alert and oriented X 3. Moves all extremities spontaneously. Psych: Normal affect.  LABS: CBC: Recent Labs  12/22/14 2309 12/23/14 0434  WBC 9.0 8.8  HGB 11.7* 11.3*  HCT 36.1 35.5*  MCV 78.5 79.2  PLT 204 199   INR: Recent Labs  12/23/14 0434  INR 4.74   Basic Metabolic Panel: Recent Labs   12/23/14 0434 12/24/14 0342  NA 137 137  K 4.4 4.7  CL 102 100  CO2 26 28  GLUCOSE 116* 135*  BUN 22 21  CREATININE 1.41* 1.48*  CALCIUM 8.9 8.6  MG 1.7  --    Pending: Liver Function Tests:No results for input(s): AST, ALT, ALKPHOS, BILITOT, PROT, ALBUMIN in the last 72 hours. Cardiac Enzymes: Recent Labs  12/23/14 0854  TROPONINI 0.06*    Recent Labs  12/22/14 2313 12/23/14 0437  TROPIPOC 0.00 0.03   BNP:  B NATRIURETIC PEPTIDE  Date/Time Value Ref Range Status  12/23/2014 04:34 AM 213.8* 0.0 - 100.0 pg/mL Final   Hemoglobin A1C: Recent Labs  12/23/14 0434  HGBA1C 6.6*   Fasting Lipid Panel: Recent Labs  12/23/14 0434  CHOL 202*  HDL 28*  LDLCALC 107*  TRIG 337*  CHOLHDL 7.2   Thyroid Function Tests: Recent Labs  12/23/14 0434  TSH 1.995    TELE:   Sinus rhythm, sinus bradycardia in the 50s     ECG: 12/24/2014 Sinus rhythm, possible LVH with repolarization abnormality, incomplete left bundle branch block, not significantly changed from admission ECG  Echocardiogram: 12/23/2014 Conclusions - Left ventricle: The cavity size was normal. Wall thickness was increased in a pattern of moderate LVH. Systolic function was normal. The estimated ejection fraction was in  the range of 60% to 65%. Doppler parameters are consistent with abnormal left ventricular relaxation (grade 1 diastolic dysfunction). - Aortic valve: Mildly calcified annulus. Trileaflet; mildly thickened leaflets. Valve area (VTI): 1.92 cm^2. Valve area (Vmax): 1.95 cm^2. - Mitral valve: Mildly calcified annulus. Mildly thickened leaflets - Left atrium: The atrium was moderately dilated. - Atrial septum: There was increased thickness of the septum, consistent with lipomatous hypertrophy. No defect or patent foramen ovale was identified. - Technically difficult study.  Cardiac cath: 12/23/2014 Left main: Short segment. No obstructive disease.  Left Anterior  Descending Artery: Large caliber vessel that courses to the apex. The proximal vessel has diffuse mild plaque. There is a 50% stenosis in the mid vessel just before the takeoff of the first diagonal branch. The remainder of the mid vessel has mild diffuse plaque. The distal vessel has mild diffuse plaque. The first diagonal branch is a moderate caliber vessel with mid 30% stenosis. The second major diagonal branch is a small caliber vessel with proximal 40% stenosis.  Circumflex Artery: Large caliber vessel with 3 obtuse marginal branches. The AV groove Circumflex has mild plaque in the proximal and mid segments. The first obtuse marginal branch is a moderate caliber vessel with proximal 20% stenosis. The second obtuse marginal branch is a moderate caliber vessel with mid 20% stenosis. The third obtuse marginal branch is a moderate caliber vessel with proximal 90% hazy stenosis. The mid segment of OM3 has a 50% stenosis.  Right Coronary Artery: Moderate caliber non-dominant vessel with no obstructive disease.  Left Ventricular Angiogram: Deferred.  Impression: 1. Severe single vessel CAD 2. Severe stenosis OM3 3. Unstable angina 4. Successful PTCA/DES x 1 OM3 Recommendations: She will need dual anti-platelet therapy with ASA and Plavix for at least 12 months. Continue beta blocker and statin.   Radiology/Studies: Dg Chest Port 1 View  12/22/2014   CLINICAL DATA:  Code ST-elevation myocardial infarction. Initial encounter.  EXAM: PORTABLE CHEST - 1 VIEW  COMPARISON:  None.  FINDINGS: The lungs are well-aerated. Apparent scarring is noted at the right lung apex, with associated postoperative change. There is no evidence of focal opacification, pleural effusion or pneumothorax.  The cardiomediastinal silhouette is within normal limits. No acute osseous abnormalities are seen.  IMPRESSION: Apparent scarring at the right lung apex, with associated postoperative change.   Electronically Signed   By:  Garald Balding M.D.   On: 12/22/2014 23:47   Medication Sig  cholecalciferol (VITAMIN D) 400 UNITS TABS tablet Take 400 Units by mouth daily.  cyanocobalamin 500 MCG tablet Take 500 mcg by mouth daily.  digoxin (LANOXIN) 0.25 MG tablet Take 0.25 mg by mouth daily.  diltiazem (DILACOR XR) 180 MG 24 hr capsule Take 180 mg by mouth daily.  febuxostat (ULORIC) 40 MG tablet Take 40 mg by mouth daily.   fenofibrate (TRICOR) 48 MG tablet Take 48 mg by mouth daily.  ferrous sulfate 325 (65 FE) MG EC tablet Take 325 mg by mouth daily with breakfast.   metFORMIN (GLUCOPHAGE) 500 MG tablet Take 500 mg by mouth 2 (two) times daily with a meal.  metoprolol (LOPRESSOR) 50 MG tablet Take 50 mg by mouth 2 (two) times daily.  ondansetron (ZOFRAN) 4 MG tablet Take 4 mg by mouth every 8 (eight) hours.  sertraline (ZOLOFT) 25 MG tablet Take 25 mg by mouth daily.  valsartan (DIOVAN) 160 MG tablet Take 160 mg by mouth daily.  vitamin C (ASCORBIC ACID) 500 MG tablet Take 500 mg by mouth  daily.     Current Medications:  . aspirin  324 mg Oral Once  . aspirin EC  81 mg Oral Daily  . atorvastatin  80 mg Oral q1800  . clopidogrel  75 mg Oral Q breakfast  . metoprolol tartrate  50 mg Oral BID   . sodium chloride      ASSESSMENT AND PLAN: Principal Problem:   ACS (acute coronary syndrome) - s/p DES to OM3 - Medical therapy for otherwise nonobstructive disease in the LAD and 50% and D2 at 40% - EF is preserved with grade 1 diastolic dysfunction  Active Problems:   Diabetes mellitus without complication - B7C is 6.6, continue current therapy    Hypertension - SBP 140s-160 since admission - She is on her home dose of metoprolol, digoxin and Cardizem held on admission    Unstable angina - See above    Palpitations - Has a history of them, part of her symptoms prior to admission included rapid heart "pounding" - Was in [facility] in 2011 with heart rate greater than 200, spontaneous conversion  to sinus rhythm, placed on Coumadin after this so probably atrial fibrillation.  - Maintaining sinus rhythm here, no palpitations overnight - She continues to get palpitations as an outpatient, may need event monitor - She takes her medications once/day, will change BB to XL form   Plan: Cardiac rehabilitation to see, discharge today.  SignedRosaria Ferries , PA-C 7:13 AM 12/24/2014

## 2014-12-24 NOTE — Progress Notes (Signed)
Spoke with patient about her diabetes. Was diagnosed about 3-4 years ago. Sees Dr. Carmie End for her diabetes control. Has been taking Metformin 500 mg every day recently (reduced dose from 500 mg BID) and been on it since she was diagnosed. Has meter and strips at home, but has not been checking blood sugars. Recommended to her that she check them every day and keep record to take to physician. HgbA1C is 6.6% here at the hospital. Ordered Living Well with Diabetes booklet for patient, dietician has seen patient. Patient states that she had attended classes when diagnosed. Suggested that she follow up with physician after discharge.  Would need to have creatinine checked frequently if remains on Metformin.  Will continue to follow while in hospital. Harvel Ricks RN BSN CDE

## 2014-12-24 NOTE — Discharge Summary (Signed)
CARDIOLOGY DISCHARGE SUMMARY   Patient ID: Regina Burke MRN: [id number] DOB/AGE: 12-10-1932 81 y.o.  Admit date: 12/22/2014 Discharge date: 12/24/2014  PCP: Tracie Harrier, MD Primary Cardiologist: Dr. Burt Knack  Primary Discharge Diagnosis:  Acute coronary syndrome Secondary Discharge Diagnosis:    Diabetes mellitus without complication   Hypertension   Unstable angina  Procedure Performed:  1. Left Heart Catheterization 2. Selective Coronary Angiography 3. PTCA/DES x 1 OM3 4. 2-D echocardiogram  Hospital Course: Regina Burke is a 81 y.o. female with no previous history of CAD. She has a history of atrial fibrillation, COPD, lung cancer, DM, and HTN. She had chest pain and shortness of breath with exertion. The symptoms were coming on with less and less exertion. She came to the emergency room and initially a code STEMI was called. Her ECG is abnormal, but this was felt mostly related to intrinsic conduction defects and the code STEMI was canceled. She was pain-free and was admitted for further evaluation and treatment.  Her initial cardiac enzymes were negative for MI. Her symptoms were concerning and cardiac catheterization was recommended. This was performed on 12/23/2014.  Cardiac catheterization results are below. She had moderate disease in the LAD but significant disease in the OM3 with a hazy 95% stenosis. This was treated with a drug-eluting stent reducing the stenosis to 0. Her EF is preserved with no wall motion abnormalities by echo.  On 04/28, she was seen by cardiac rehabilitation. She ambulated with them but was hypoxic with ambulation, oxygen saturations in the mid-80s. She improved to the low 90s with 2 L of oxygen. Her chest x-ray was reviewed and showed no acute problems. She does not have home O2 but will be discharged on this. She was educated on stent restrictions, exercise guidelines and heart-healthy lifestyle modifications.  She was seen by Dr.  Tamala Julian and all data were reviewed. She had a minimal elevation in her troponin after the cath, consistent with acute coronary syndrome but was without chest pain and her shortness of breath was at baseline. Her ECG was unchanged. Home O2 was arranged. She takes her medications daily, so the metoprolol was changed to succinate for better compliance. No further inpatient workup was indicated and she is considered stable for discharge, to follow up as an outpatient.  Labs:   Lab Results  Component Value Date   WBC 8.8 12/23/2014   HGB 11.3* 12/23/2014   HCT 35.5* 12/23/2014   MCV 79.2 12/23/2014   PLT 199 12/23/2014     Recent Labs Lab 12/24/14 0342  NA 137  K 4.7  CL 100  CO2 28  BUN 21  CREATININE 1.48*  CALCIUM 8.6  PROT 5.8*  BILITOT 0.5  ALKPHOS 38*  ALT 13  AST 25  GLUCOSE 135*    Recent Labs  12/23/14 0854  TROPONINI 0.06*   Lipid Panel     Component Value Date/Time   CHOL 202* 12/23/2014 0434   TRIG 337* 12/23/2014 0434   HDL 28* 12/23/2014 0434   CHOLHDL 7.2 12/23/2014 0434   VLDL 67* 12/23/2014 0434   LDLCALC 107* 12/23/2014 0434    B NATRIURETIC PEPTIDE  Date/Time Value Ref Range Status  12/23/2014 04:34 AM 213.8* 0.0 - 100.0 pg/mL Final    Recent Labs  12/23/14 0434  INR 1.07      Radiology:  Dg Chest Port 1 View 12/22/2014   CLINICAL DATA:  Code ST-elevation myocardial infarction. Initial encounter.  EXAM: PORTABLE CHEST - 1 VIEW  COMPARISON:  None.  FINDINGS: The lungs are well-aerated. Apparent scarring is noted at the right lung apex, with associated postoperative change. There is no evidence of focal opacification, pleural effusion or pneumothorax.  The cardiomediastinal silhouette is within normal limits. No acute osseous abnormalities are seen.  IMPRESSION: Apparent scarring at the right lung apex, with associated postoperative change.   Electronically Signed   By: Garald Balding M.D.   On: 12/22/2014 23:47    Cardiac cath: 12/23/2014 Left  main: Short segment. No obstructive disease.  Left Anterior Descending Artery: Large caliber vessel that courses to the apex. The proximal vessel has diffuse mild plaque. There is a 50% stenosis in the mid vessel just before the takeoff of the first diagonal branch. The remainder of the mid vessel has mild diffuse plaque. The distal vessel has mild diffuse plaque. The first diagonal branch is a moderate caliber vessel with mid 30% stenosis. The second major diagonal branch is a small caliber vessel with proximal 40% stenosis.  Circumflex Artery: Large caliber vessel with 3 obtuse marginal branches. The AV groove Circumflex has mild plaque in the proximal and mid segments. The first obtuse marginal branch is a moderate caliber vessel with proximal 20% stenosis. The second obtuse marginal branch is a moderate caliber vessel with mid 20% stenosis. The third obtuse marginal branch is a moderate caliber vessel with proximal 90% hazy stenosis. The mid segment of OM3 has a 50% stenosis.  Right Coronary Artery: Moderate caliber non-dominant vessel with no obstructive disease.  Left Ventricular Angiogram: Deferred.  Impression: 1. Severe single vessel CAD 2. Severe stenosis OM3 3. Unstable angina 4. Successful PTCA/DES x 1 OM3 Recommendations: She will need dual anti-platelet therapy with ASA and Plavix for at least 12 months. Continue beta blocker and statin.   ECG: 12/24/2014 Sinus rhythm, possible LVH with repolarization abnormality, incomplete left bundle branch block, not significantly changed from admission ECG  Echocardiogram: 12/23/2014 Conclusions - Left ventricle: The cavity size was normal. Wall thickness was increased in a pattern of moderate LVH. Systolic function was normal. The estimated ejection fraction was in the range of 60% to 65%. Doppler parameters are consistent with abnormal left ventricular relaxation (grade 1 diastolic dysfunction). - Aortic valve: Mildly  calcified annulus. Trileaflet; mildly thickened leaflets. Valve area (VTI): 1.92 cm^2. Valve area (Vmax): 1.95 cm^2. - Mitral valve: Mildly calcified annulus. Mildly thickened leaflets - Left atrium: The atrium was moderately dilated. - Atrial septum: There was increased thickness of the septum, consistent with lipomatous hypertrophy. No defect or patent foramen ovale was identified. - Technically difficult study.  FOLLOW UP PLANS AND APPOINTMENTS Allergies  Allergen Reactions  . Ciprofloxacin Shortness Of Breath, Itching and Rash  . Doxycycline Shortness Of Breath, Itching and Rash  . Penicillins Shortness Of Breath, Itching and Rash  . Sulfa Antibiotics Shortness Of Breath, Itching and Rash  . Morphine And Related Itching     Medication List    STOP taking these medications        metoprolol 50 MG tablet  Commonly known as:  LOPRESSOR      TAKE these medications        cholecalciferol 400 UNITS Tabs tablet  Commonly known as:  VITAMIN D  Take 400 Units by mouth daily.     clopidogrel 75 MG tablet  Commonly known as:  PLAVIX  Take 1 tablet (75 mg total) by mouth daily.     cyanocobalamin 500 MCG tablet  Take 500 mcg by mouth daily.  digoxin 0.25 MG tablet  Commonly known as:  LANOXIN  Take 0.25 mg by mouth daily.     diltiazem 180 MG 24 hr capsule  Commonly known as:  DILACOR XR  Take 180 mg by mouth daily.     febuxostat 40 MG tablet  Commonly known as:  ULORIC  Take 40 mg by mouth daily.     fenofibrate 48 MG tablet  Commonly known as:  TRICOR  Take 48 mg by mouth daily.     ferrous sulfate 325 (65 FE) MG EC tablet  Take 325 mg by mouth daily with breakfast.     metFORMIN 500 MG tablet  Commonly known as:  GLUCOPHAGE  Take 500 mg by mouth 2 (two) times daily with a meal.  Notes to Patient:  HOLD for 48 hours. Restart on 12/26/2014      metoprolol succinate 100 MG 24 hr tablet  Commonly known as:  TOPROL XL  Take 1 tablet (100 mg total)  by mouth daily. Take with or immediately following a meal.     nitroGLYCERIN 0.4 MG SL tablet  Commonly known as:  NITROSTAT  Place 1 tablet (0.4 mg total) under the tongue every 5 (five) minutes as needed for chest pain.     ondansetron 4 MG tablet  Commonly known as:  ZOFRAN  Take 4 mg by mouth every 8 (eight) hours.     sertraline 25 MG tablet  Commonly known as:  ZOLOFT  Take 25 mg by mouth daily.     valsartan 160 MG tablet  Commonly known as:  DIOVAN  Take 160 mg by mouth daily.     vitamin C 500 MG tablet  Commonly known as:  ASCORBIC ACID  Take 500 mg by mouth daily.        Discharge Instructions    Amb Referral to Cardiac Rehabilitation    Complete by:  As directed   Congestive Heart Failure: If diagnosis is Heart Failure, patient MUST meet each of the CMS criteria: 1. Left Ventricular Ejection Fraction </= 35% 2. NYHA class II-IV symptoms despite being on optimal heart failure therapy for at least 6 weeks. 3. Stable = have not had a recent (<6 weeks) or planned (<6 months) major cardiovascular hospitalization or procedure  Program Details: - Physician supervised classes - 1-3 classes per week over a 12-18 week period, generally for a total of 36 sessions  Physician Certification: I certify that the above Cardiac Rehabilitation treatment is medically necessary and is medically approved by me for treatment of this patient. The patient is willing and cooperative, able to ambulate and medically stable to participate in exercise rehabilitation. The participant's progress and Individualized Treatment Plan will be reviewed by the Medical Director, Cardiac Rehab staff and as indicated by the Referring/Ordering Physician.  Diagnosis:  PCI     Diet - low sodium heart healthy    Complete by:  As directed      Diet Carb Modified    Complete by:  As directed      Increase activity slowly    Complete by:  As directed           Follow-up Information    Follow up with  Sherren Mocha, MD.   Specialty:  Cardiology   Why:  The office will call.   Contact information:   [address] [place] [address] [phone]       BRING ALL MEDICATIONS WITH YOU TO FOLLOW UP APPOINTMENTS  Time spent with patient to include physician time: 49 min Signed: Rosaria Ferries, PA-C 12/24/2014, 12:35 PM Co-Sign MD

## 2014-12-24 NOTE — Plan of Care (Signed)
Problem: Food- and Nutrition-Related Knowledge Deficit (NB-1.1) Goal: Nutrition education Formal process to instruct or train a patient/client in a skill or to impart knowledge to help patients/clients voluntarily manage or modify food choices and eating behavior to maintain or improve health. Outcome: Adequate for Discharge  RD consulted for nutrition education regarding diabetes.     Lab Results  Component Value Date    HGBA1C 6.6* 12/23/2014    RD provided "Carbohydrate Counting for People with Diabetes" handout from the Academy of [employer] Dietetics. Discussed different food groups and their effects on blood sugar, emphasizing carbohydrate-containing foods. Provided list of carbohydrates and recommended serving sizes of common foods.  Discussed importance of controlled and consistent carbohydrate intake throughout the day. Provided examples of ways to balance meals/snacks and encouraged intake of high-fiber, whole grain complex carbohydrates. Teach back method used.  Expect poor to fair compliance.  Body mass index is 28.52 kg/(m^2). Pt meets criteria for overweight based on current BMI.  Current diet order is Heart Healthy/ Carb Modified, patient is consuming approximately 75-90% of meals at this time. Labs and medications reviewed. No further nutrition interventions warranted at this time. RD contact information provided. If additional nutrition issues arise, please re-consult RD.  Ange Puskas A. Jimmye Norman, RD, LDN, CDE Pager: [phone] After hours Pager: [phone]

## 2014-12-25 ENCOUNTER — Telehealth: Payer: Self-pay | Admitting: Cardiovascular Disease

## 2014-12-25 NOTE — Telephone Encounter (Signed)
Patient needs a TOC phone Call

## 2014-12-25 NOTE — Telephone Encounter (Signed)
Dr cooper patient

## 2014-12-25 NOTE — Telephone Encounter (Signed)
Phoned patient but no voice mail set up to leave message

## 2014-12-28 NOTE — Telephone Encounter (Signed)
Left message to call back  

## 2014-12-29 ENCOUNTER — Telehealth: Payer: Self-pay | Admitting: Cardiovascular Disease

## 2014-12-29 NOTE — Telephone Encounter (Signed)
LMTCB TOC call

## 2014-12-29 NOTE — Telephone Encounter (Signed)
LMTCB. Attempts to call patient x3 with no response. Will close encounter. If patient does call back, we will address TCM questions at that time.

## 2014-12-29 NOTE — Telephone Encounter (Signed)
New message      Returning Paula's call

## 2015-01-01 ENCOUNTER — Telehealth: Payer: Self-pay | Admitting: *Deleted

## 2015-01-01 NOTE — Telephone Encounter (Signed)
Left message for Regina Burke to call [facility] to set up orientation appointment.

## 2015-01-07 ENCOUNTER — Inpatient Hospital Stay: Payer: Medicare Other | Attending: Oncology | Admitting: Oncology

## 2015-01-07 ENCOUNTER — Encounter: Payer: Self-pay | Admitting: Oncology

## 2015-01-07 VITALS — BP 142/61 | HR 58 | Temp 97.9°F | Resp 20 | Wt 161.1 lb

## 2015-01-07 DIAGNOSIS — K219 Gastro-esophageal reflux disease without esophagitis: Secondary | ICD-10-CM | POA: Diagnosis not present

## 2015-01-07 DIAGNOSIS — I252 Old myocardial infarction: Secondary | ICD-10-CM | POA: Diagnosis not present

## 2015-01-07 DIAGNOSIS — I509 Heart failure, unspecified: Secondary | ICD-10-CM | POA: Insufficient documentation

## 2015-01-07 DIAGNOSIS — Z87891 Personal history of nicotine dependence: Secondary | ICD-10-CM | POA: Diagnosis not present

## 2015-01-07 DIAGNOSIS — N183 Chronic kidney disease, stage 3 (moderate): Secondary | ICD-10-CM | POA: Diagnosis not present

## 2015-01-07 DIAGNOSIS — C3411 Malignant neoplasm of upper lobe, right bronchus or lung: Secondary | ICD-10-CM | POA: Insufficient documentation

## 2015-01-07 DIAGNOSIS — E119 Type 2 diabetes mellitus without complications: Secondary | ICD-10-CM | POA: Diagnosis not present

## 2015-01-07 DIAGNOSIS — Z79899 Other long term (current) drug therapy: Secondary | ICD-10-CM

## 2015-01-07 DIAGNOSIS — G4730 Sleep apnea, unspecified: Secondary | ICD-10-CM | POA: Diagnosis not present

## 2015-01-07 DIAGNOSIS — C3490 Malignant neoplasm of unspecified part of unspecified bronchus or lung: Secondary | ICD-10-CM

## 2015-01-07 DIAGNOSIS — I129 Hypertensive chronic kidney disease with stage 1 through stage 4 chronic kidney disease, or unspecified chronic kidney disease: Secondary | ICD-10-CM

## 2015-01-07 DIAGNOSIS — E780 Pure hypercholesterolemia: Secondary | ICD-10-CM | POA: Diagnosis not present

## 2015-01-08 ENCOUNTER — Encounter: Payer: Self-pay | Admitting: Oncology

## 2015-01-08 DIAGNOSIS — N189 Chronic kidney disease, unspecified: Secondary | ICD-10-CM | POA: Insufficient documentation

## 2015-01-08 DIAGNOSIS — M810 Age-related osteoporosis without current pathological fracture: Secondary | ICD-10-CM | POA: Insufficient documentation

## 2015-01-08 DIAGNOSIS — C3490 Malignant neoplasm of unspecified part of unspecified bronchus or lung: Secondary | ICD-10-CM | POA: Insufficient documentation

## 2015-01-08 DIAGNOSIS — J449 Chronic obstructive pulmonary disease, unspecified: Secondary | ICD-10-CM | POA: Insufficient documentation

## 2015-01-08 DIAGNOSIS — M1990 Unspecified osteoarthritis, unspecified site: Secondary | ICD-10-CM | POA: Insufficient documentation

## 2015-01-08 DIAGNOSIS — D631 Anemia in chronic kidney disease: Secondary | ICD-10-CM

## 2015-01-08 DIAGNOSIS — T7840XA Allergy, unspecified, initial encounter: Secondary | ICD-10-CM | POA: Insufficient documentation

## 2015-01-08 DIAGNOSIS — C3411 Malignant neoplasm of upper lobe, right bronchus or lung: Secondary | ICD-10-CM | POA: Insufficient documentation

## 2015-01-08 NOTE — Progress Notes (Signed)
[place] @ [facility] [phone]  Fax:([phone] [place]: 01/04/1933  MR#: [id number]  UKG#:[id number]  Patient Care Team: Tracie Harrier, MD as PCP - General (Internal Medicine)  CHIEF COMPLAINT:  Chief Complaint  Patient presents with  . Follow-up    Lung Cancer     No history exists.    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  No cough.  Patient was recently admitted in the hospital with coronary artery disease and a stent placement was done.  No chills.  No fever.  Patient is on oxygen.  Does not smoke.  Appetite has been stable.  REVIEW OF SYSTEMS:   Gen. status: Declining performance status due to cardiac condition.  HEENT: No evidence of stomatitis or difficulty in swallowing.  Lungs: Increasing shortness of breath due to coronary condition on oxygen.  Cardiac: Recent coronary artery disease status post stent placement GI: No nausea no vomiting.  GU: No dysuria or hematuria.  Musculoskeletal system no bony pains.  Lower extremity 1+ edema.  Neurological system no headache dizziness.  Skin: No rash.  As per HPI. Otherwise, a complete review of systems is negatve.  PAST MEDICAL HISTORY: Past Medical History  Diagnosis Date  . COPD (chronic obstructive pulmonary disease)   . CHF (congestive heart failure)   . Hypertension   . Rapid palpitations 2014    Seen at [facility], may have been atrial fibrillation  . DOE (dyspnea on exertion)     Started after treatment for lung cancer  . High cholesterol   . Heart murmur   . Anginal pain   . Myocardial infarction 12/22/2014     "& probably one ~ 1 wk ago & probably one in ~ 2004" (12/23/2014)  . Asthma   . Pneumonia     "several times in my lifetime" (12/23/2014)  . Lung cancer dx'd 2014   S/P radiation 2015  . Chronic bronchitis     "get it just about q yr" (12/23/2014)  . Sleep apnea     "couldn't use the mask" (12/23/2014)  . Cushing's disease   . Type II diabetes mellitus   . Anemia   . History of blood transfusion     "related to when I was pregnant and a couple times since" (12/23/2014)  . GERD (gastroesophageal reflux disease)   . History of hiatal hernia   . Migraine     "maybe twice/yr" (12/23/2014)  . Daily headache   . Arthritis     "hands, knees, probably my back" (12/23/2014)  . Gout   . Depression   . Chronic kidney disease (CKD), stage III (moderate)   . Cancer of upper lobe of right lung 01/08/2015    PAST SURGICAL HISTORY: Past Surgical History  Procedure Laterality Date  . Adrenalectomy Left 1980's    "C[place]ings"  . Abdominal hysterectomy    . Appendectomy    . Cholecystectomy    . Coronary angioplasty with stent placement  12/23/2014  . Tonsillectomy    . Fracture surgery    . Wrist fracture surgery Bilateral ~ 2000  . Breast cyst excision Left   . Tubal ligation    . Left heart catheterization with coronary angiogram N/A 12/23/2014    Procedure: LEFT HEART CATHETERIZATION WITH CORONARY ANGIOGRAM;  Surgeon: Burnell Blanks, MD;  Location: [facility] CATH LAB;  Service: Cardiovascular;  Laterality: N/A;  . Percutaneous coronary stent intervention (pci-s)  12/23/2014    Procedure: PERCUTANEOUS CORONARY STENT INTERVENTION (PCI-S);  Surgeon: Burnell Blanks, MD;  Location: [facility] CATH LAB;  Service: Cardiovascular;;  Promus 2.25x8    FAMILY HISTORY History reviewed. No pertinent family history.  GYNECOLOGIC HISTORY:  No LMP recorded. Patient has had a hysterectomy.     ADVANCED DIRECTIVES:    HEALTH MAINTENANCE: History  Substance Use Topics  . Smoking status: Former Smoker -- 1.00 packs/day for 45 years    Types: Cigarettes    Quit date: 04/25/1998  . Smokeless tobacco: Never Used  . Alcohol Use: Yes     Comment: 12/23/2014 "might have  a couple mixed drinks/year"     Colonoscopy:  PAP:  Bone density:  Lipid panel:  Allergies  Allergen Reactions  . Ciprofloxacin Shortness Of Breath, Itching and Rash  . Doxycycline Shortness Of Breath, Itching and Rash  . Penicillins Shortness Of Breath, Itching and Rash  . Sulfa Antibiotics Shortness Of Breath, Itching and Rash  . Morphine And Related Itching    Current Outpatient Prescriptions  Medication Sig Dispense Refill  . CARTIA XT 240 MG 24 hr capsule     . cholecalciferol (VITAMIN D) 400 UNITS TABS tablet Take 400 Units by mouth daily.    . clopidogrel (PLAVIX) 75 MG tablet Take 1 tablet (75 mg total) by mouth daily. 30 tablet 11  . cyanocobalamin 500 MCG tablet Take 500 mcg by mouth daily.    . digoxin (LANOXIN) 0.25 MG tablet Take 0.25 mg by mouth daily.    Marland Kitchen diltiazem (DILACOR XR) 180 MG 24 hr capsule Take 180 mg by mouth daily.    . febuxostat (ULORIC) 40 MG tablet Take 40 mg by mouth daily.     . fenofibrate (TRICOR) 48 MG tablet Take 48 mg by mouth daily.    . ferrous sulfate 325 (65 FE) MG EC tablet Take 325 mg by mouth daily with breakfast.     . metFORMIN (GLUCOPHAGE) 500 MG tablet Take 500 mg by mouth 2 (two) times daily with a meal.    . metoprolol succinate (TOPROL-XL) 100 MG 24 hr tablet Take 1 tablet (100 mg total) by mouth daily. Take with or immediately following a meal. 30 tablet 11  . nitroGLYCERIN (NITROSTAT) 0.4 MG SL tablet Place 1 tablet (0.4 mg total) under the tongue every 5 (five) minutes as needed for chest pain. 25 tablet 3  . ondansetron (ZOFRAN) 4 MG tablet Take 4 mg by mouth every 8 (eight) hours.    . sertraline (ZOLOFT) 25 MG tablet Take 25 mg by mouth daily.    . valsartan (DIOVAN) 160 MG tablet Take 160 mg by mouth daily.    . vitamin C (ASCORBIC ACID) 500 MG tablet Take 500 mg by mouth daily.     No current facility-administered medications for this visit.    OBJECTIVE:  Filed Vitals:   01/07/15 1107  BP: 142/61  Pulse: 58    Temp: 97.9 F (36.6 C)  Resp: 20     Body mass index is 27.64 kg/(m^2).    ECOG FS:2 - Symptomatic, <50% confined to bed  PHYSICAL EXAM: Gen. status: Patient is alert and oriented not any acute distress.  Lymphatic system no palpable supraclavicular or cervical exhilarating want adenopathy.  Lungs: Diminished air entry on both sides.  Crepitations at both bases.  Cardiac: Soft systolic  murmur.  Upgaze or irregular heart sounds.  GI: Abdomen is soft.  Liver spleen not palpable.  No distention.  No ascites.  Skin: No rash or ecchymosis.  GI lower extremity 1+ edema.  Neurological examination higher functions within normal limit cranial nerves are intact.   LAB RESULTS:  No visits with results within 3 Day(s) from this visit. Latest known visit with results is:  Admission on 12/22/2014, Discharged on 12/24/2014  Component Date Value Ref Range Status  . WBC 12/22/2014 9.0  4.0 - 10.5 K/uL Final  . RBC 12/22/2014 4.60  3.87 - 5.11 MIL/uL Final  . Hemoglobin 12/22/2014 11.7* 12.0 - 15.0 g/dL Final  . HCT 12/22/2014 36.1  36.0 - 46.0 % Final  . MCV 12/22/2014 78.5  78.0 - 100.0 fL Final  . MCH 12/22/2014 25.4* 26.0 - 34.0 pg Final  . MCHC 12/22/2014 32.4  30.0 - 36.0 g/dL Final  . RDW 12/22/2014 14.6  11.5 - 15.5 % Final  . Platelets 12/22/2014 204  150 - 400 K/uL Final  . Sodium 12/22/2014 137  135 - 145 mmol/L Final  . Potassium 12/22/2014 4.2  3.5 - 5.1 mmol/L Final  . Chloride 12/22/2014 99  96 - 112 mmol/L Final  . CO2 12/22/2014 24  19 - 32 mmol/L Final  . Glucose, Bld 12/22/2014 258* 70 - 99 mg/dL Final  . BUN 12/22/2014 20  6 - 23 mg/dL Final  . Creatinine, Ser 12/22/2014 1.38* 0.50 - 1.10 mg/dL Final  . Calcium 12/22/2014 9.0  8.4 - 10.5 mg/dL Final  . GFR calc non Af Amer 12/22/2014 35* >90 mL/min Final  . GFR calc Af Amer 12/22/2014 41* >90 mL/min Final   Comment: (NOTE) The eGFR has been calculated using the CKD EPI equation. This calculation has not been validated in all  clinical situations. eGFR's persistently <90 mL/min signify possible Chronic Kidney Disease.   . Anion gap 12/22/2014 14  5 - 15 Final  . Troponin i, poc 12/22/2014 0.00  0.00 - 0.08 ng/mL Final  . Comment 3 12/22/2014          Final   Comment: Due to the release kinetics of cTnI, a negative result within the first hours of the onset of symptoms does not rule out myocardial infarction with certainty. If myocardial infarction is still suspected, repeat the test at appropriate intervals.   . Heparin Unfractionated 12/23/2014 <0.10* 0.30 - 0.70 IU/mL Final   Comment:        IF HEPARIN RESULTS ARE BELOW EXPECTED VALUES, AND PATIENT DOSAGE HAS BEEN CONFIRMED, SUGGEST FOLLOW UP TESTING OF ANTITHROMBIN III LEVELS.   . TSH 12/23/2014 1.995  0.350 - 4.500 uIU/mL Final  . Free T4 12/23/2014 1.16  0.80 - 1.80 ng/dL Final   Performed at [facility]  . Hgb A1c MFr Bld 12/23/2014 6.6* 4.8 - 5.6 % Final   Comment: (NOTE)         Pre-diabetes: 5.7 - 6.4         Diabetes: >6.4         Glycemic control for adults with diabetes: <7.0   . Mean Plasma Glucose 12/23/2014 143   Final   Comment: (NOTE) Performed At: [facility] [place], [place] [id number] Lindon Romp MD [id number]   . B Natriuretic Peptide 12/23/2014 213.8* 0.0 - 100.0 pg/mL Final  . Magnesium 12/23/2014 1.7  1.5 - 2.5 mg/dL Final  . Digoxin Level 12/23/2014 2.4* 0.8 - 2.0 ng/mL Final  .  Sodium 12/23/2014 137  135 - 145 mmol/L Final  . Potassium 12/23/2014 4.4  3.5 - 5.1 mmol/L Final  . Chloride 12/23/2014 102  96 - 112 mmol/L Final  . CO2 12/23/2014 26  19 - 32 mmol/L Final  . Glucose, Bld 12/23/2014 116* 70 - 99 mg/dL Final  . BUN 12/23/2014 22  6 - 23 mg/dL Final  . Creatinine, Ser 12/23/2014 1.41* 0.50 - 1.10 mg/dL Final  . Calcium 12/23/2014 8.9  8.4 - 10.5 mg/dL Final  . GFR calc non Af Amer 12/23/2014 34* >90 mL/min Final  . GFR calc Af Amer 12/23/2014 40* >90 mL/min Final    Comment: (NOTE) The eGFR has been calculated using the CKD EPI equation. This calculation has not been validated in all clinical situations. eGFR's persistently <90 mL/min signify possible Chronic Kidney Disease.   . Anion gap 12/23/2014 9  5 - 15 Final  . Cholesterol 12/23/2014 202* 0 - 200 mg/dL Final  . Triglycerides 12/23/2014 337* <150 mg/dL Final  . HDL 12/23/2014 28* >39 mg/dL Final  . Total CHOL/HDL Ratio 12/23/2014 7.2   Final  . VLDL 12/23/2014 67* 0 - 40 mg/dL Final  . LDL Cholesterol 12/23/2014 107* 0 - 99 mg/dL Final   Comment:        Total Cholesterol/HDL:CHD Risk Coronary Heart Disease Risk Table                     Men   Women  1/2 Average Risk   3.4   3.3  Average Risk       5.0   4.4  2 X Average Risk   9.6   7.1  3 X Average Risk  23.4   11.0        Use the calculated Patient Ratio above and the CHD Risk Table to determine the patient's CHD Risk.        ATP III CLASSIFICATION (LDL):  <100     mg/dL   Optimal  100-129  mg/dL   Near or Above                    Optimal  130-159  mg/dL   Borderline  160-189  mg/dL   High  >190     mg/dL   Very High   . WBC 12/23/2014 8.8  4.0 - 10.5 K/uL Final  . RBC 12/23/2014 4.48  3.87 - 5.11 MIL/uL Final  . Hemoglobin 12/23/2014 11.3* 12.0 - 15.0 g/dL Final  . HCT 12/23/2014 35.5* 36.0 - 46.0 % Final  . MCV 12/23/2014 79.2  78.0 - 100.0 fL Final  . MCH 12/23/2014 25.2* 26.0 - 34.0 pg Final  . MCHC 12/23/2014 31.8  30.0 - 36.0 g/dL Final  . RDW 12/23/2014 14.8  11.5 - 15.5 % Final  . Platelets 12/23/2014 199  150 - 400 K/uL Final  . Prothrombin Time 12/23/2014 14.1  11.6 - 15.2 seconds Final  . INR 12/23/2014 1.07  0.00 - 1.49 Final  . Troponin i, poc 12/23/2014 0.03  0.00 - 0.08 ng/mL Final  . Comment 3 12/23/2014          Final   Comment: Due to the release kinetics of cTnI, a negative result within the first hours of the onset of symptoms does not rule out myocardial infarction with certainty. If myocardial  infarction is still suspected, repeat the test at appropriate intervals.   . Troponin I 12/23/2014 0.06* <0.031 ng/mL Final   Comment:  PERSISTENTLY INCREASED TROPONIN VALUES IN THE RANGE OF 0.04-0.49 ng/mL CAN BE SEEN IN:       -UNSTABLE ANGINA       -CONGESTIVE HEART FAILURE       -MYOCARDITIS       -CHEST TRAUMA       -ARRYHTHMIAS       -LATE PRESENTING MYOCARDIAL INFARCTION       -COPD   CLINICAL FOLLOW-UP RECOMMENDED.   Marland Kitchen Glucose-Capillary 12/23/2014 143* 70 - 99 mg/dL Final  . Sodium 12/24/2014 137  135 - 145 mmol/L Final  . Potassium 12/24/2014 4.7  3.5 - 5.1 mmol/L Final  . Chloride 12/24/2014 100  96 - 112 mmol/L Final  . CO2 12/24/2014 28  19 - 32 mmol/L Final  . Glucose, Bld 12/24/2014 135* 70 - 99 mg/dL Final  . BUN 12/24/2014 21  6 - 23 mg/dL Final  . Creatinine, Ser 12/24/2014 1.48* 0.50 - 1.10 mg/dL Final  . Calcium 12/24/2014 8.6  8.4 - 10.5 mg/dL Final  . GFR calc non Af Amer 12/24/2014 32* >90 mL/min Final  . GFR calc Af Amer 12/24/2014 38* >90 mL/min Final   Comment: (NOTE) The eGFR has been calculated using the CKD EPI equation. This calculation has not been validated in all clinical situations. eGFR's persistently <90 mL/min signify possible Chronic Kidney Disease.   . Anion gap 12/24/2014 9  5 - 15 Final  . Glucose-Capillary 12/23/2014 115* 70 - 99 mg/dL Final  . Glucose-Capillary 12/23/2014 158* 70 - 99 mg/dL Final  . Glucose-Capillary 12/24/2014 137* 70 - 99 mg/dL Final  . Comment 1 12/24/2014 Notify RN   Final  . Comment 2 12/24/2014 Document in Chart   Final  . Total Protein 12/24/2014 5.8* 6.0 - 8.3 g/dL Final  . Albumin 12/24/2014 3.2* 3.5 - 5.2 g/dL Final  . AST 12/24/2014 25  0 - 37 U/L Final  . ALT 12/24/2014 13  0 - 35 U/L Final  . Alkaline Phosphatase 12/24/2014 38* 39 - 117 U/L Final  . Total Bilirubin 12/24/2014 0.5  0.3 - 1.2 mg/dL Final  . Bilirubin, Direct 12/24/2014 0.1  0.0 - 0.5 mg/dL Final  . Indirect Bilirubin 12/24/2014  0.4  0.3 - 0.9 mg/dL Final  . Activated Clotting Time 12/23/2014 239   Final    No results found for: LABCA2 No results found for: CA199 Lab Results  Component Value Date   CEA 0.6 03/24/2013   No results found for: PSA No results found for: CA125   STUDIES: Nm Pet Image Restag (ps) Skull Base To Thigh  12/21/2014   CLINICAL DATA:  Subsequent treatment strategy for right upper lobe lung cancer. Chest pain. Cough.  EXAM: NUCLEAR MEDICINE PET SKULL BASE TO THIGH  TECHNIQUE: 12.7 mCi F-18 FDG was injected intravenously. Full-ring PET imaging was performed from the skull base to thigh after the radiotracer. CT data was obtained and used for attenuation correction and anatomic localization.  FASTING BLOOD GLUCOSE:  Value: 137 mg/dl  COMPARISON:  Multiple exams, including 06/01/2014  FINDINGS: NECK  No hypermetabolic lymph nodes in the neck.  CHEST  The original right upper lobe nodule measures 1.3 by 0.7 cm on image 72 of series 3 and has a maximum standard uptake value 1.9.  New curvilinear density posterior to this measures up to 3.7 by 2.7 cm and has a maximum standard uptake value of 2.1.  There is some mild scattered nodularity in both lungs without additional hypermetabolic activity. Small hypodense right thyroid  lesion noted. Coronary, aortic, and branch vessel atherosclerotic calcification observed.  ABDOMEN/PELVIS  Photopenic splenic lesions with once again noted. Bilateral renal cysts of varying complexity.  Aortoiliac atherosclerotic vascular disease. Prior cholecystectomy. Stable right adrenal adenoma.  SKELETON  No focal hypermetabolic activity to suggest skeletal metastasis. Grade 1 anterolisthesis at L4-5 with associated degenerative facet arthropathy and suspected impingement.  IMPRESSION: 1. Reduced size and metabolic activity of the right upper lobe lesion. However, there is a new curvilinear band of abnormal density in the right upper lobe adjacent to this lesion. Both the lesion and  the abnormal adjacent new density measure with a standard uptake value of approximately 2, below the typical specific threshold for malignancy, and the adjacent band of density may well be inflammatory or infectious. Consider followup chest radiography for observation. 2. Other findings include atherosclerosis, a stable right adrenal adenoma, photopenic splenic lesions with splenomegaly, bilateral renal cysts of varying complexity, and grade 1 anterolisthesis at the L4-5 level associated with impingement.   Electronically Signed   By: Van Clines M.D.   On: 12/21/2014 12:31   Dg Chest Port 1 View  12/22/2014   CLINICAL DATA:  Code ST-elevation myocardial infarction. Initial encounter.  EXAM: PORTABLE CHEST - 1 VIEW  COMPARISON:  None.  FINDINGS: The lungs are well-aerated. Apparent scarring is noted at the right lung apex, with associated postoperative change. There is no evidence of focal opacification, pleural effusion or pneumothorax.  The cardiomediastinal silhouette is within normal limits. No acute osseous abnormalities are seen.  IMPRESSION: Apparent scarring at the right lung apex, with associated postoperative change.   Electronically Signed   By: Garald Balding M.D.   On: 12/22/2014 23:47    ASSESSMENT: Carcinoma of lung, non-small cell type.  No evidence of recurrent or progressive disease based on clinical and radio graphic examination Recent episode of acute myocardial infarction underwent coronary arteriogram and stent placement.  MEDICAL DECISION MAKING:  Review PET scan independently as well as reviewed with the patient. As there is no evidence of recurrent disease and patient is been declining because of cardiac condition follow-up appointment in made in 6 month for reevaluation  Patient expressed understanding and was in agreement with this plan. She also understands that She can call clinic at any time with any questions, concerns, or complaints.    Cancer of upper lobe of  right lung   Staging form: Lung, AJCC 7th Edition     Clinical: T1, N0, M0 - Signed by Forest Gleason, MD on 01/08/2015   Forest Gleason, MD   01/08/2015 6:13 PM

## 2015-02-05 ENCOUNTER — Encounter: Payer: Medicare Other | Admitting: Physician Assistant

## 2015-04-13 ENCOUNTER — Ambulatory Visit (INDEPENDENT_AMBULATORY_CARE_PROVIDER_SITE_OTHER): Payer: Medicare Other | Admitting: Cardiovascular Disease

## 2015-04-13 VITALS — BP 146/60 | HR 65 | Ht 65.0 in | Wt 148.7 lb

## 2015-04-13 DIAGNOSIS — I4891 Unspecified atrial fibrillation: Secondary | ICD-10-CM | POA: Diagnosis not present

## 2015-04-13 DIAGNOSIS — I10 Essential (primary) hypertension: Secondary | ICD-10-CM | POA: Diagnosis not present

## 2015-04-13 DIAGNOSIS — Z01818 Encounter for other preprocedural examination: Secondary | ICD-10-CM

## 2015-04-13 DIAGNOSIS — I2583 Coronary atherosclerosis due to lipid rich plaque: Secondary | ICD-10-CM

## 2015-04-13 DIAGNOSIS — I2510 Atherosclerotic heart disease of native coronary artery without angina pectoris: Secondary | ICD-10-CM

## 2015-04-13 MED ORDER — ATORVASTATIN CALCIUM 40 MG PO TABS: 40.0000 mg | ORAL_TABLET | Freq: Every day | ORAL | Status: DC

## 2015-04-13 MED ORDER — ASPIRIN EC 81 MG PO TBEC: 81.0000 mg | DELAYED_RELEASE_TABLET | Freq: Every day | ORAL | Status: DC

## 2015-04-13 MED ORDER — DIGOXIN 125 MCG PO TABS: 0.1250 mg | ORAL_TABLET | Freq: Every day | ORAL | Status: DC

## 2015-04-13 NOTE — Patient Instructions (Signed)
Your physician recommends that you return for lab work in: 6 weeks.  Your physician has recommended you make the following change in your medication: start new prescription for atorvastatin 40 mg. Also start aspirin 81 mg daily. Cut the digoxin 0.25 mg tablets into half. Take 1/2 tablet daily.  Your physician recommends that you schedule a follow-up appointment in: 4 months with Dr. Claiborne Billings.

## 2015-04-14 ENCOUNTER — Encounter: Payer: Self-pay | Admitting: Oncology

## 2015-04-14 ENCOUNTER — Inpatient Hospital Stay: Payer: Medicare Other

## 2015-04-14 ENCOUNTER — Encounter: Payer: Self-pay | Admitting: Emergency Medicine

## 2015-04-14 ENCOUNTER — Other Ambulatory Visit: Payer: Self-pay

## 2015-04-14 ENCOUNTER — Inpatient Hospital Stay: Payer: Medicare Other | Attending: Oncology | Admitting: Oncology

## 2015-04-14 ENCOUNTER — Observation Stay
Admission: EM | Admit: 2015-04-14 | Discharge: 2015-04-15 | Disposition: A | Discharge status: Home / Self Care | Payer: Medicare Other | Attending: Internal Medicine | Admitting: Internal Medicine

## 2015-04-14 ENCOUNTER — Telehealth: Payer: Self-pay | Admitting: *Deleted

## 2015-04-14 ENCOUNTER — Encounter: Payer: Self-pay | Admitting: Internal Medicine

## 2015-04-14 VITALS — BP 146/70 | HR 57 | Temp 96.4°F | Wt 151.0 lb

## 2015-04-14 DIAGNOSIS — Z885 Allergy status to narcotic agent status: Secondary | ICD-10-CM | POA: Insufficient documentation

## 2015-04-14 DIAGNOSIS — E1122 Type 2 diabetes mellitus with diabetic chronic kidney disease: Secondary | ICD-10-CM | POA: Diagnosis not present

## 2015-04-14 DIAGNOSIS — I252 Old myocardial infarction: Secondary | ICD-10-CM

## 2015-04-14 DIAGNOSIS — N179 Acute kidney failure, unspecified: Secondary | ICD-10-CM | POA: Diagnosis present

## 2015-04-14 DIAGNOSIS — E119 Type 2 diabetes mellitus without complications: Secondary | ICD-10-CM | POA: Diagnosis not present

## 2015-04-14 DIAGNOSIS — Z825 Family history of asthma and other chronic lower respiratory diseases: Secondary | ICD-10-CM | POA: Insufficient documentation

## 2015-04-14 DIAGNOSIS — F329 Major depressive disorder, single episode, unspecified: Secondary | ICD-10-CM | POA: Insufficient documentation

## 2015-04-14 DIAGNOSIS — R112 Nausea with vomiting, unspecified: Secondary | ICD-10-CM | POA: Diagnosis not present

## 2015-04-14 DIAGNOSIS — Z87891 Personal history of nicotine dependence: Secondary | ICD-10-CM | POA: Insufficient documentation

## 2015-04-14 DIAGNOSIS — Z882 Allergy status to sulfonamides status: Secondary | ICD-10-CM | POA: Diagnosis not present

## 2015-04-14 DIAGNOSIS — Z9889 Other specified postprocedural states: Secondary | ICD-10-CM | POA: Diagnosis not present

## 2015-04-14 DIAGNOSIS — J45909 Unspecified asthma, uncomplicated: Secondary | ICD-10-CM | POA: Insufficient documentation

## 2015-04-14 DIAGNOSIS — T460X1A Poisoning by cardiac-stimulant glycosides and drugs of similar action, accidental (unintentional), initial encounter: Principal | ICD-10-CM | POA: Diagnosis present

## 2015-04-14 DIAGNOSIS — Z7902 Long term (current) use of antithrombotics/antiplatelets: Secondary | ICD-10-CM | POA: Insufficient documentation

## 2015-04-14 DIAGNOSIS — N183 Chronic kidney disease, stage 3 (moderate): Secondary | ICD-10-CM | POA: Diagnosis not present

## 2015-04-14 DIAGNOSIS — Z8249 Family history of ischemic heart disease and other diseases of the circulatory system: Secondary | ICD-10-CM | POA: Insufficient documentation

## 2015-04-14 DIAGNOSIS — Z8261 Family history of arthritis: Secondary | ICD-10-CM | POA: Insufficient documentation

## 2015-04-14 DIAGNOSIS — K219 Gastro-esophageal reflux disease without esophagitis: Secondary | ICD-10-CM | POA: Insufficient documentation

## 2015-04-14 DIAGNOSIS — I509 Heart failure, unspecified: Secondary | ICD-10-CM | POA: Insufficient documentation

## 2015-04-14 DIAGNOSIS — H269 Unspecified cataract: Secondary | ICD-10-CM | POA: Diagnosis not present

## 2015-04-14 DIAGNOSIS — I480 Paroxysmal atrial fibrillation: Secondary | ICD-10-CM | POA: Diagnosis not present

## 2015-04-14 DIAGNOSIS — C3411 Malignant neoplasm of upper lobe, right bronchus or lung: Secondary | ICD-10-CM

## 2015-04-14 DIAGNOSIS — I2510 Atherosclerotic heart disease of native coronary artery without angina pectoris: Secondary | ICD-10-CM

## 2015-04-14 DIAGNOSIS — Z881 Allergy status to other antibiotic agents status: Secondary | ICD-10-CM | POA: Insufficient documentation

## 2015-04-14 DIAGNOSIS — E780 Pure hypercholesterolemia: Secondary | ICD-10-CM | POA: Diagnosis not present

## 2015-04-14 DIAGNOSIS — I129 Hypertensive chronic kidney disease with stage 1 through stage 4 chronic kidney disease, or unspecified chronic kidney disease: Secondary | ICD-10-CM | POA: Insufficient documentation

## 2015-04-14 DIAGNOSIS — Z90710 Acquired absence of both cervix and uterus: Secondary | ICD-10-CM | POA: Insufficient documentation

## 2015-04-14 DIAGNOSIS — C3490 Malignant neoplasm of unspecified part of unspecified bronchus or lung: Secondary | ICD-10-CM | POA: Diagnosis not present

## 2015-04-14 DIAGNOSIS — Z79899 Other long term (current) drug therapy: Secondary | ICD-10-CM | POA: Insufficient documentation

## 2015-04-14 DIAGNOSIS — F419 Anxiety disorder, unspecified: Secondary | ICD-10-CM | POA: Insufficient documentation

## 2015-04-14 DIAGNOSIS — N189 Chronic kidney disease, unspecified: Secondary | ICD-10-CM

## 2015-04-14 DIAGNOSIS — E875 Hyperkalemia: Secondary | ICD-10-CM | POA: Diagnosis present

## 2015-04-14 DIAGNOSIS — Z7982 Long term (current) use of aspirin: Secondary | ICD-10-CM | POA: Insufficient documentation

## 2015-04-14 DIAGNOSIS — Z833 Family history of diabetes mellitus: Secondary | ICD-10-CM | POA: Insufficient documentation

## 2015-04-14 DIAGNOSIS — Z880 Allergy status to penicillin: Secondary | ICD-10-CM | POA: Insufficient documentation

## 2015-04-14 DIAGNOSIS — J449 Chronic obstructive pulmonary disease, unspecified: Secondary | ICD-10-CM | POA: Insufficient documentation

## 2015-04-14 DIAGNOSIS — G4730 Sleep apnea, unspecified: Secondary | ICD-10-CM | POA: Insufficient documentation

## 2015-04-14 DIAGNOSIS — Z955 Presence of coronary angioplasty implant and graft: Secondary | ICD-10-CM | POA: Insufficient documentation

## 2015-04-14 DIAGNOSIS — M109 Gout, unspecified: Secondary | ICD-10-CM | POA: Insufficient documentation

## 2015-04-14 DIAGNOSIS — Z9049 Acquired absence of other specified parts of digestive tract: Secondary | ICD-10-CM | POA: Insufficient documentation

## 2015-04-14 LAB — COMPREHENSIVE METABOLIC PANEL
ALBUMIN: 4.2 g/dL (ref 3.5–5.0)
ALT: 22 U/L (ref 14–54)
ALT: 22 U/L (ref 14–54)
AST: 27 U/L (ref 15–41)
AST: 28 U/L (ref 15–41)
Albumin: 4.1 g/dL (ref 3.5–5.0)
Alkaline Phosphatase: 46 U/L (ref 38–126)
Alkaline Phosphatase: 42 U/L (ref 38–126)
Anion gap: 5 (ref 5–15)
Anion gap: 8 (ref 5–15)
BUN: 53 mg/dL — AB (ref 6–20)
BUN: 51 mg/dL — ABNORMAL HIGH (ref 6–20)
CHLORIDE: 105 mmol/L (ref 101–111)
CO2: 25 mmol/L (ref 22–32)
CO2: 26 mmol/L (ref 22–32)
Calcium: 8.8 mg/dL — ABNORMAL LOW (ref 8.9–10.3)
Calcium: 9.1 mg/dL (ref 8.9–10.3)
Chloride: 98 mmol/L — ABNORMAL LOW (ref 101–111)
Creatinine, Ser: 1.77 mg/dL — ABNORMAL HIGH (ref 0.44–1.00)
Creatinine, Ser: 1.77 mg/dL — ABNORMAL HIGH (ref 0.44–1.00)
GFR calc Af Amer: 30 mL/min — ABNORMAL LOW (ref >60)
GFR calc non Af Amer: 26 mL/min — ABNORMAL LOW (ref >60)
GFR calc non Af Amer: 26 mL/min — ABNORMAL LOW (ref >60)
GFR, EST AFRICAN AMERICAN: 30 mL/min — AB (ref >60)
GLUCOSE: 149 mg/dL — AB (ref 65–99)
Glucose, Bld: 103 mg/dL — ABNORMAL HIGH (ref 65–99)
POTASSIUM: 6.1 mmol/L — AB (ref 3.5–5.1)
Potassium: 6.1 mmol/L — ABNORMAL HIGH (ref 3.5–5.1)
SODIUM: 135 mmol/L (ref 135–145)
Sodium: 132 mmol/L — ABNORMAL LOW (ref 135–145)
Total Bilirubin: 0.4 mg/dL (ref 0.3–1.2)
Total Bilirubin: 0.4 mg/dL (ref 0.3–1.2)
Total Protein: 7.1 g/dL (ref 6.5–8.1)
Total Protein: 6.9 g/dL (ref 6.5–8.1)

## 2015-04-14 LAB — CBC WITH DIFFERENTIAL/PLATELET
BASOS ABS: 0.1 10*3/uL (ref 0–0.1)
BASOS PCT: 1 %
Basophils Absolute: 0.1 10*3/uL (ref 0–0.1)
Basophils Relative: 1 %
Eosinophils Absolute: 0.3 10*3/uL (ref 0–0.7)
Eosinophils Absolute: 0.3 10*3/uL (ref 0–0.7)
Eosinophils Relative: 4 %
Eosinophils Relative: 4 %
HCT: 33.6 % — ABNORMAL LOW (ref 35.0–47.0)
HEMATOCRIT: 32.6 % — AB (ref 35.0–47.0)
Hemoglobin: 11.1 g/dL — ABNORMAL LOW (ref 12.0–16.0)
Hemoglobin: 10.5 g/dL — ABNORMAL LOW (ref 12.0–16.0)
LYMPHS ABS: 0.8 10*3/uL — AB (ref 1.0–3.6)
Lymphocytes Relative: 12 %
Lymphocytes Relative: 12 %
Lymphs Abs: 0.8 10*3/uL — ABNORMAL LOW (ref 1.0–3.6)
MCH: 25.2 pg — ABNORMAL LOW (ref 26.0–34.0)
MCH: 24.7 pg — ABNORMAL LOW (ref 26.0–34.0)
MCHC: 33.1 g/dL (ref 32.0–36.0)
MCHC: 32.1 g/dL (ref 32.0–36.0)
MCV: 76 fL — ABNORMAL LOW (ref 80.0–100.0)
MCV: 77.1 fL — AB (ref 80.0–100.0)
MONO ABS: 0.6 10*3/uL (ref 0.2–0.9)
MONO ABS: 0.5 10*3/uL (ref 0.2–0.9)
Monocytes Relative: 8 %
NEUTROS ABS: 5.3 10*3/uL (ref 1.4–6.5)
NEUTROS ABS: 5.4 10*3/uL (ref 1.4–6.5)
Neutrophils Relative %: 75 %
Neutrophils Relative %: 76 %
PLATELETS: 75 10*3/uL — AB (ref 150–440)
Platelets: 71 10*3/uL — ABNORMAL LOW (ref 150–440)
RBC: 4.42 MIL/uL (ref 3.80–5.20)
RBC: 4.23 MIL/uL (ref 3.80–5.20)
RDW: 15.2 % — AB (ref 11.5–14.5)
RDW: 15.5 % — AB (ref 11.5–14.5)
WBC: 7 10*3/uL (ref 3.6–11.0)
WBC: 7.1 10*3/uL (ref 3.6–11.0)

## 2015-04-14 LAB — URINALYSIS COMPLETE WITH MICROSCOPIC (ARMC ONLY)
Bacteria, UA: NONE SEEN
Bilirubin Urine: NEGATIVE
Glucose, UA: NEGATIVE mg/dL
Hgb urine dipstick: NEGATIVE
KETONES UR: NEGATIVE mg/dL
Leukocytes, UA: NEGATIVE
Nitrite: NEGATIVE
PH: 5 (ref 5.0–8.0)
PROTEIN: NEGATIVE mg/dL
SPECIFIC GRAVITY, URINE: 1.013 (ref 1.005–1.030)

## 2015-04-14 LAB — DIGOXIN LEVEL
Digoxin Level: 2.7 ng/mL (ref 0.8–2.0)
Digoxin Level: 2.8 ng/mL (ref 0.8–2.0)

## 2015-04-14 MED ORDER — SODIUM CHLORIDE 0.9 % IV BOLUS (SEPSIS)
1000.0000 mL | Freq: Once | INTRAVENOUS | Status: AC
  Administered 2015-04-14: 1000 mL via INTRAVENOUS

## 2015-04-14 MED ORDER — SODIUM CHLORIDE 0.9 % IV SOLN
2.0000 | Freq: Once | INTRAVENOUS | Status: AC
  Administered 2015-04-14: 2 via INTRAVENOUS
  Filled 2015-04-14: qty 80

## 2015-04-14 NOTE — ED Notes (Signed)
Patient resting comfortably, son at bedside. NAD noted.

## 2015-04-14 NOTE — H&P (Signed)
[name] NAME: Regina Burke    MR#:  [id number]  DATE OF BIRTH:  1932-10-04  DATE OF ADMISSION:  04/14/2015  PRIMARY CARE PHYSICIAN: Tracie Harrier, MD   REQUESTING/REFERRING PHYSICIAN: Reita Cliche, M.D.  CHIEF COMPLAINT:   Chief Complaint  Patient presents with  . Drug Overdose  . Nausea    HISTORY OF PRESENT ILLNESS:  Regina Burke  is a 81 y.o. female who presents with digoxin toxicity. Patient states that over the last 2 weeks has had a variety of symptoms, including upper respiratory infection which she took antibiotics, as well as UTI for which she took antibiotics. She states that during that timeframe she's had nausea and vomiting, malaise, increasing blurred vision. She is currently seeing an ophthalmologist triggers for cataracts, and assumed that her vision was disturbed because of her cataracts, but she also states that she's been seeing different colors. Her ophthalmologist wanted her to be evaluated by her cardiologist prior to cataract extraction surgery, and at her cardiologist's office her digoxin level was checked and was found to be elevated. She had her dose of digoxin cut in half, and when she went home her son reviewed her medications and found that she had actually been taking double her dose for a couple of weeks. She saw her oncologist shortly after that, and had her digoxin level checked again and it was still elevated, so she was sent to the ED. Here her digoxin level is 2.8, and her potassium was 6.1, and so hospitalists were called for admission. She was given Digibind in the ED.  PAST MEDICAL HISTORY:   Past Medical History  Diagnosis Date  . COPD (chronic obstructive pulmonary disease)   . CHF (congestive heart failure)   . Hypertension   . Rapid palpitations 2014    Seen at [facility], may have been atrial fibrillation  . DOE (dyspnea on exertion)     Started after treatment for lung cancer  . High  cholesterol   . Heart murmur   . Anginal pain   . Myocardial infarction 12/22/2014     "& probably one ~ 1 wk ago & probably one in ~ 2004" (12/23/2014)  . Asthma   . Pneumonia     "several times in my lifetime" (12/23/2014)  . Lung cancer dx'd 2014    S/P radiation 2015  . Chronic bronchitis     "get it just about q yr" (12/23/2014)  . Sleep apnea     "couldn't use the mask" (12/23/2014)  . Cushing's disease   . Type II diabetes mellitus   . Anemia   . History of blood transfusion     "related to when I was pregnant and a couple times since" (12/23/2014)  . GERD (gastroesophageal reflux disease)   . History of hiatal hernia   . Migraine     "maybe twice/yr" (12/23/2014)  . Daily headache   . Arthritis     "hands, knees, probably my back" (12/23/2014)  . Gout   . Depression   . Chronic kidney disease (CKD), stage III (moderate)   . Cancer of upper lobe of right lung 01/08/2015    PAST SURGICAL HISTORY:   Past Surgical History  Procedure Laterality Date  . Adrenalectomy Left 1980's    "Cushings"  . Abdominal hysterectomy    . Appendectomy    . Cholecystectomy    . Coronary angioplasty with stent placement  12/23/2014  . Tonsillectomy    .  Fracture surgery    . Wrist fracture surgery Bilateral ~ 2000  . Breast cyst excision Left   . Tubal ligation    . Left heart catheterization with coronary angiogram N/A 12/23/2014    Procedure: LEFT HEART CATHETERIZATION WITH CORONARY ANGIOGRAM;  Surgeon: Burnell Blanks, MD;  Location: [facility] CATH LAB;  Service: Cardiovascular;  Laterality: N/A;  . Percutaneous coronary stent intervention (pci-s)  12/23/2014    Procedure: PERCUTANEOUS CORONARY STENT INTERVENTION (PCI-S);  Surgeon: Burnell Blanks, MD;  Location: [facility] CATH LAB;  Service: Cardiovascular;;  Promus 2.25x8    SOCIAL HISTORY:   Social History  Substance Use Topics  . Smoking status: Former Smoker -- 1.00 packs/day for 45 years    Types: Cigarettes    Quit date:  04/25/1998  . Smokeless tobacco: Never Used  . Alcohol Use: Yes     Comment: 12/23/2014 "might have a couple mixed drinks/year"    FAMILY HISTORY:   Family History  Problem Relation Age of Onset  . Heart disease Mother   . Diabetes Mother   . Osteoarthritis Mother   . Hypertension Mother   . Heart disease Father   . Hypertension Father   . COPD Brother     DRUG ALLERGIES:   Allergies  Allergen Reactions  . Ciprofloxacin Shortness Of Breath, Itching and Rash  . Doxycycline Shortness Of Breath, Itching and Rash  . Penicillins Shortness Of Breath, Itching and Rash  . Sulfa Antibiotics Shortness Of Breath, Itching and Rash  . Morphine And Related Itching    MEDICATIONS AT HOME:   Prior to Admission medications   Medication Sig Start Date End Date Taking? Authorizing Provider  aspirin EC 81 MG tablet Take 1 tablet (81 mg total) by mouth daily. 04/13/15  Yes Troy Sine, MD  cholecalciferol (VITAMIN D) 400 UNITS TABS tablet Take 400 Units by mouth daily.   Yes Historical Provider, MD  clopidogrel (PLAVIX) 75 MG tablet Take 1 tablet (75 mg total) by mouth daily. 12/24/14  Yes Rhonda G Barrett, PA-C  cyanocobalamin 500 MCG tablet Take 500 mcg by mouth daily.   Yes Historical Provider, MD  digoxin (LANOXIN) 0.125 MG tablet Take 1 tablet (0.125 mg total) by mouth daily. 04/13/15  Yes Troy Sine, MD  diltiazem (DILACOR XR) 180 MG 24 hr capsule Take 180 mg by mouth daily.   Yes Historical Provider, MD  febuxostat (ULORIC) 40 MG tablet Take 40 mg by mouth daily.    Yes Historical Provider, MD  fenofibrate (TRICOR) 48 MG tablet Take 48 mg by mouth daily.   Yes Historical Provider, MD  ferrous sulfate 325 (65 FE) MG EC tablet Take 325 mg by mouth daily.    Yes Historical Provider, MD  metFORMIN (GLUCOPHAGE) 500 MG tablet Take 500 mg by mouth 2 (two) times daily with a meal.   Yes Historical Provider, MD  metoprolol succinate (TOPROL-XL) 100 MG 24 hr tablet Take 1 tablet (100 mg total)  by mouth daily. Take with or immediately following a meal. 12/24/14  Yes Rhonda G Barrett, PA-C  Multiple Vitamins-Minerals (PRESERVISION AREDS 2) CAPS Take 1 capsule by mouth daily.   Yes Historical Provider, MD  nitroGLYCERIN (NITROSTAT) 0.4 MG SL tablet Place 1 tablet (0.4 mg total) under the tongue every 5 (five) minutes as needed for chest pain. 12/24/14  Yes Rhonda G Barrett, PA-C  ondansetron (ZOFRAN) 4 MG tablet Take 4 mg by mouth every 8 (eight) hours as needed for nausea or vomiting.  Yes Historical Provider, MD  sertraline (ZOLOFT) 25 MG tablet Take 25 mg by mouth daily.   Yes Historical Provider, MD  valsartan (DIOVAN) 160 MG tablet Take 160 mg by mouth daily.   Yes Historical Provider, MD  vitamin C (ASCORBIC ACID) 500 MG tablet Take 500 mg by mouth daily.   Yes Historical Provider, MD  atorvastatin (LIPITOR) 40 MG tablet Take 1 tablet (40 mg total) by mouth daily. 04/13/15   Troy Sine, MD    REVIEW OF SYSTEMS:  Review of Systems  Constitutional: Positive for malaise/fatigue. Negative for fever, chills and weight loss.  HENT: Negative for ear pain, hearing loss and tinnitus.   Eyes: Negative for blurred vision, double vision, pain and redness.       "Seeing different colors"  Respiratory: Negative for cough, hemoptysis and shortness of breath.   Cardiovascular: Negative for chest pain, palpitations, orthopnea and leg swelling.  Gastrointestinal: Positive for nausea. Negative for vomiting, abdominal pain, diarrhea and constipation.  Genitourinary: Negative for dysuria, frequency and hematuria.  Musculoskeletal: Negative for back pain, joint pain and neck pain.  Skin:       No acne, rash, or lesions  Neurological: Negative for dizziness, tremors, focal weakness and weakness.  Endo/Heme/Allergies: Negative for polydipsia. Does not bruise/bleed easily.  Psychiatric/Behavioral: Negative for depression. The patient is not nervous/anxious and does not have insomnia.      VITAL  SIGNS:   Filed Vitals:   04/14/15 2100 04/14/15 2130 04/14/15 2155 04/14/15 2200  BP: 156/68 149/79  152/81  Pulse:  75  75  Temp:      TempSrc:      Resp: '19 19 23 16  '$ Height:      Weight:      SpO2:  99%  98%   Wt Readings from Last 3 Encounters:  04/14/15 68.04 kg (150 lb)  04/14/15 68.493 kg (151 lb)  04/13/15 67.45 kg (148 lb 11.2 oz)    PHYSICAL EXAMINATION:  Physical Exam  Vitals reviewed. Constitutional: She is oriented to person, place, and time. She appears well-developed and well-nourished. No distress.  HENT:  Head: Normocephalic and atraumatic.  Mouth/Throat: Oropharynx is clear and moist.  Eyes: Conjunctivae and EOM are normal. Pupils are equal, round, and reactive to light. No scleral icterus.  Neck: Normal range of motion. Neck supple. No JVD present. No thyromegaly present.  Cardiovascular: Normal rate, regular rhythm and intact distal pulses.  Exam reveals no gallop and no friction rub.   No murmur heard. Respiratory: Effort normal and breath sounds normal. No respiratory distress. She has no wheezes. She has no rales.  GI: Soft. Bowel sounds are normal. She exhibits no distension. There is no tenderness.  Musculoskeletal: Normal range of motion. She exhibits no edema.  No arthritis, no gout  Lymphadenopathy:    She has no cervical adenopathy.  Neurological: She is alert and oriented to person, place, and time. No cranial nerve deficit.  No dysarthria, no aphasia  Skin: Skin is warm and dry. No rash noted. No erythema.  Psychiatric: She has a normal mood and affect. Her behavior is normal. Judgment and thought content normal.    LABORATORY PANEL:   CBC  Recent Labs Lab 04/14/15 1700  WBC 7.1  HGB 10.5*  HCT 32.6*  PLT 71*   ------------------------------------------------------------------------------------------------------------------  Chemistries   Recent Labs Lab 04/14/15 1700  NA 132*  K 6.1*  CL 98*  CO2 26  GLUCOSE 103*  BUN  51*  CREATININE 1.77*  CALCIUM 9.1  AST 28  ALT 22  ALKPHOS 42  BILITOT 0.4   ------------------------------------------------------------------------------------------------------------------  Cardiac Enzymes No results for input(s): TROPONINI in the last 168 hours. ------------------------------------------------------------------------------------------------------------------  RADIOLOGY:  No results found.  EKG:   Orders placed or performed in visit on 04/13/15  . EKG 12-Lead    IMPRESSION AND PLAN:  Principal Problem:   Digoxin toxicity - given Digibind in the ED, patient does not have cardiac symptoms or arrhythmia on EKG or telemetry will admit her here and keep her on telemetry, trend her digoxin level, and monitor for improvement of her symptoms. Active Problems:   Hyperkalemia - mildly elevated at 6.1, asymptomatic, we'll hydrate her tonight and monitor her level in the morning.   Diabetes mellitus without complication - sliding scale insulin with appropriate glucose checks, carb modified diet   Hypertension - continue home meds   Acute on Chronic kidney disease - avoid renal toxins, monitor her creatinine level for improvement with hydration overnight.   CAD (coronary artery disease) - continue home medicines, specifically including aspirin and Plavix that she has a history of recent stent placement within the last 4 months.  All the records are reviewed and case discussed with ED provider. Management plans discussed with the patient and/or family.  DVT PROPHYLAXIS: SubQ heparin  ADMISSION STATUS: Observation  CODE STATUS: Full  TOTAL TIME TAKING CARE OF THIS PATIENT: 45 minutes.    Tamantha Saline FIELDING 04/14/2015, 11:08 PM  [facility]  Office  [phone]  CC: Primary care physician; Tracie Harrier, MD

## 2015-04-14 NOTE — ED Notes (Signed)
Patient sent from [place] POV with c/o of accidental digoxin overdose with nausea, blurred vision,  And hyperkalemia

## 2015-04-14 NOTE — Telephone Encounter (Signed)
Spoke with patient regarding abnormal labs and that Dr Oliva Bustard states she needs to go to the ER for heart monitoring IVF and other treatment, She repeated this back to me and I called ER and gave report to Carleene Overlie the charge nurse. I also called son [name] and let him know what was going on and he said he is going to have a fight on his hands to get her there, but that he will take her there

## 2015-04-14 NOTE — Progress Notes (Signed)
Patient does not have living will.  Former smoker.  Patient here today as new evaluation referred by Dr. Genice Rouge due to abnormal CXR.  Hx of lung cancer.

## 2015-04-14 NOTE — ED Notes (Signed)
Dr Jannifer Franklin, Hospitalist, at bedside for admission.

## 2015-04-14 NOTE — ED Provider Notes (Signed)
[facility] Emergency Department Provider Note   ____________________________________________  Time seen: 5 PM I have reviewed the triage vital signs and the triage nursing note.  HISTORY  Chief Complaint Drug Overdose and Nausea   Historian Patient and her son  HPI Regina Burke is a 81 y.o. female who is here because of elevated digoxin level. The patient saw her cardiologist this week and upon reviewing her medications realize that she was taking 0.25 mg digoxin twice per day for an unknown duration even though she should have only been taking 0.25 once per day. Without checking any labs he had suggested that she reduce her dose to 0.125 mg daily. Today the patient had a follow-up with her oncologist who did blood work and found digoxin level elevated, as well as mild renal failure with elevated potassium and patient was sent to the ED for further evaluation, and admission for treatment. The patient has been symptomatic with some minor confusion, nausea for about a week. She was diagnosed with urinary tract infection about one week ago and took about 5 days out of 7 before she was very nauseated was able unable to finish the prescription. She is having mild headache, moderate generalized weakness. No fevers.    Past Medical History  Diagnosis Date  . COPD (chronic obstructive pulmonary disease)   . CHF (congestive heart failure)   . Hypertension   . Rapid palpitations 2014    Seen at [facility], may have been atrial fibrillation  . DOE (dyspnea on exertion)     Started after treatment for lung cancer  . High cholesterol   . Heart murmur   . Anginal pain   . Myocardial infarction 12/22/2014     "& probably one ~ 1 wk ago & probably one in ~ 2004" (12/23/2014)  . Asthma   . Pneumonia     "several times in my lifetime" (12/23/2014)  . Lung cancer dx'd 2014    S/P radiation 2015  . Chronic bronchitis     "get it just about q yr" (12/23/2014)  . Sleep apnea      "couldn't use the mask" (12/23/2014)  . Cushing's disease   . Type II diabetes mellitus   . Anemia   . History of blood transfusion     "related to when I was pregnant and a couple times since" (12/23/2014)  . GERD (gastroesophageal reflux disease)   . History of hiatal hernia   . Migraine     "maybe twice/yr" (12/23/2014)  . Daily headache   . Arthritis     "hands, knees, probably my back" (12/23/2014)  . Gout   . Depression   . Chronic kidney disease (CKD), stage III (moderate)   . Cancer of upper lobe of right lung 01/08/2015    Patient Active Problem List   Diagnosis Date Noted  . Cancer of upper lobe of right lung 01/08/2015  . Allergic state 01/08/2015  . Absolute anemia 01/08/2015  . A-fib 01/08/2015  . Carcinoma of lung 01/08/2015  . Chronic kidney disease 01/08/2015  . CAFL (chronic airflow limitation) 01/08/2015  . Arthritis, degenerative 01/08/2015  . 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 ACS (acute coronary syndrome) 12/23/2014  . Unstable angina 12/23/2014  . Diabetes mellitus without complication   . Hypertension   . Rapid palpitations   . Diabetes mellitus, type 2 09/11/2014    Past Surgical History  Procedure Laterality Date  . Adrenalectomy Left 1980's    "Cushings"  . Abdominal hysterectomy    .  Appendectomy    . Cholecystectomy    . Coronary angioplasty with stent placement  12/23/2014  . Tonsillectomy    . Fracture surgery    . Wrist fracture surgery Bilateral ~ 2000  . Breast cyst excision Left   . Tubal ligation    . Left heart catheterization with coronary angiogram N/A 12/23/2014    Procedure: LEFT HEART CATHETERIZATION WITH CORONARY ANGIOGRAM;  Surgeon: Burnell Blanks, MD;  Location: [facility] CATH LAB;  Service: Cardiovascular;  Laterality: N/A;  . Percutaneous coronary stent intervention (pci-s)  12/23/2014    Procedure: PERCUTANEOUS CORONARY STENT INTERVENTION (PCI-S);  Surgeon: Burnell Blanks, MD;  Location: [facility] CATH LAB;   Service: Cardiovascular;;  Promus 2.25x8    Current Outpatient Rx  Name  Route  Sig  Dispense  Refill  . aspirin EC 81 MG tablet   Oral   Take 1 tablet (81 mg total) by mouth daily.   90 tablet   3   . cholecalciferol (VITAMIN D) 400 UNITS TABS tablet   Oral   Take 400 Units by mouth daily.         . clopidogrel (PLAVIX) 75 MG tablet   Oral   Take 1 tablet (75 mg total) by mouth daily.   30 tablet   11   . cyanocobalamin 500 MCG tablet   Oral   Take 500 mcg by mouth daily.         . digoxin (LANOXIN) 0.125 MG tablet   Oral   Take 1 tablet (0.125 mg total) by mouth daily.   90 tablet   3   . diltiazem (DILACOR XR) 180 MG 24 hr capsule   Oral   Take 180 mg by mouth daily.         . febuxostat (ULORIC) 40 MG tablet   Oral   Take 40 mg by mouth daily.          . fenofibrate (TRICOR) 48 MG tablet   Oral   Take 48 mg by mouth daily.         . ferrous sulfate 325 (65 FE) MG EC tablet   Oral   Take 325 mg by mouth daily.          . metFORMIN (GLUCOPHAGE) 500 MG tablet   Oral   Take 500 mg by mouth 2 (two) times daily with a meal.         . metoprolol succinate (TOPROL-XL) 100 MG 24 hr tablet   Oral   Take 1 tablet (100 mg total) by mouth daily. Take with or immediately following a meal.   30 tablet   11   . Multiple Vitamins-Minerals (PRESERVISION AREDS 2) CAPS   Oral   Take 1 capsule by mouth daily.         . nitroGLYCERIN (NITROSTAT) 0.4 MG SL tablet   Sublingual   Place 1 tablet (0.4 mg total) under the tongue every 5 (five) minutes as needed for chest pain.   25 tablet   3   . ondansetron (ZOFRAN) 4 MG tablet   Oral   Take 4 mg by mouth every 8 (eight) hours as needed for nausea or vomiting.          . sertraline (ZOLOFT) 25 MG tablet   Oral   Take 25 mg by mouth daily.         . valsartan (DIOVAN) 160 MG tablet   Oral   Take 160 mg by mouth daily.         Marland Kitchen  vitamin C (ASCORBIC ACID) 500 MG tablet   Oral   Take 500 mg  by mouth daily.         Marland Kitchen atorvastatin (LIPITOR) 40 MG tablet   Oral   Take 1 tablet (40 mg total) by mouth daily.   90 tablet   3     Allergies Ciprofloxacin; Doxycycline; Penicillins; Sulfa antibiotics; and Morphine and related  History reviewed. No pertinent family history.  Social History Social History  Substance Use Topics  . Smoking status: Former Smoker -- 1.00 packs/day for 45 years    Types: Cigarettes    Quit date: 04/25/1998  . Smokeless tobacco: Never Used  . Alcohol Use: Yes     Comment: 12/23/2014 "might have a couple mixed drinks/year"    Review of Systems  Constitutional: Negative for fever. Eyes: Negative for visual changes. ENT: Negative for sore throat. Cardiovascular: Negative for chest pain. Respiratory: Negative for shortness of breath. Gastrointestinal: Negative for abdominal pain, vomiting and diarrhea. Genitourinary: Negative for dysuria. Musculoskeletal: Negative for back pain. Skin: Negative for rash. Neurological: Negative for headaches, focal weakness or numbness. 10 point Review of Systems otherwise negative ____________________________________________   PHYSICAL EXAM:  VITAL SIGNS: ED Triage Vitals  Enc Vitals Group     BP 04/14/15 1620 154/45 mmHg     Pulse Rate 04/14/15 1620 63     Resp 04/14/15 1620 19     Temp 04/14/15 1620 98.3 F (36.8 C)     Temp Source 04/14/15 1620 Oral     SpO2 04/14/15 1620 93 %     Weight 04/14/15 1620 150 lb (68.04 kg)     Height 04/14/15 1620 '5\' 4"'$  (1.626 m)     Head Cir --      Peak Flow --      Pain Score 04/14/15 1822 0     Pain Loc --      Pain Edu? --      Excl. in [place]? --      Constitutional: Alert and oriented. Well appearing and in no distress. Eyes: Conjunctivae are normal. PERRL. Normal extraocular movements. ENT   Head: Normocephalic and atraumatic.   Nose: No congestion/rhinnorhea.   Mouth/Throat: Mucous membranes are moist.   Neck: No  stridor. Cardiovascular/Chest: Normal rate, regular rhythm.  No murmurs, rubs, or gallops. Respiratory: Normal respiratory effort without tachypnea nor retractions. Breath sounds are clear and equal bilaterally. No wheezes/rales/rhonchi. Gastrointestinal: Soft. No distention, no guarding, no rebound. Nontender   Genitourinary/rectal:Deferred Musculoskeletal: Nontender with normal range of motion in all extremities. No joint effusions.  No lower extremity tenderness nor edema. Neurologic:  Normal speech and language. No gross or focal neurologic deficits are appreciated. Skin:  Skin is warm, dry and intact. No rash noted. Psychiatric: Mood and affect are normal. Speech and behavior are normal. Patient exhibits appropriate insight and judgment.  ____________________________________________   EKG I, Lisa Roca, MD, the attending physician have personally viewed and interpreted all ECGs.  59 bpm. Sinus bradycardia. Left axis deviation. Left bundle branch block. ____________________________________________  LABS (pertinent positives/negatives)  White blood count 7.1, hemoglobin 10.5, spleen platelet count 71 Complete metabolic panel significant for sodium 132, potassium 6.1, chloride 98, glucose 103, BUN 51 and creatinine 1.77 Digoxin level 2.8  ____________________________________________  RADIOLOGY All Xrays were viewed by me. Imaging interpreted by Radiologist.  None __________________________________________  PROCEDURES  Procedure(s) performed: None Critical Care performed: CRITICAL CARE Performed by: Lisa Roca   Total critical care time: 35 minutes  Critical care  time was exclusive of separately billable procedures and treating other patients.  Critical care was necessary to treat or prevent imminent or life-threatening deterioration.  Critical care was time spent personally by me on the following activities: development of treatment plan with patient and/or  surrogate as well as nursing, discussions with consultants, evaluation of patient's response to treatment, examination of patient, obtaining history from patient or surrogate, ordering and performing treatments and interventions, ordering and review of laboratory studies, ordering and review of radiographic studies, pulse oximetry and re-evaluation of patient's condition.   ____________________________________________   ED COURSE / ASSESSMENT AND PLAN  CONSULTATIONS: Hospitalist for admission  Pertinent labs & imaging results that were available during my care of the patient were reviewed by me and considered in my medical decision making (see chart for details).  Patient was sent in with labs consistent with elevated digoxin level, as well as hyperkalemia.  The patient's symptoms do sound consistent with chronic dig toxicity. She will symptoms, as well as GI symptoms, as well as neurologic symptoms. Patient will be treated with Digibind. After reviewing recommendations in Up-to-date regarding treatment of dig toxicity, recommended no additional treatment for hyperkalemia in the setting of dig toxicity other than the Digibind. No noted cardiac arrhythmias.  Urinalysis pending. Patient had been on an antibiotic for about 5 days out of 7 day course, and doesn't really feel symptomatic now, however they're concerned whether or not she could still have a urinary tract infection due to the mild confusion.  No focal neurologic deficit, I do not suspect an intracranial abnormality  Patient / Family / Caregiver informed of clinical course, medical decision-making process, and agree with plan.    ___________________________________________   FINAL CLINICAL IMPRESSION(S) / ED DIAGNOSES   Final diagnoses:  Digoxin toxicity, accidental or unintentional, initial encounter      Lisa Roca, MD 04/14/15 2014

## 2015-04-14 NOTE — Progress Notes (Signed)
[place] @ [facility] [phone]  Fax:([phone]) [place]: 1933-04-05  MR#: [id number]  KOE#:[id number]  Patient Care Team: Tracie Harrier, MD as PCP - General (Internal Medicine)  CHIEF COMPLAINT:  Chief Complaint  Patient presents with  . New Evaluation     No history exists.    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  No cough.  Patient was recently admitted in the hospital with coronary artery disease and a stent placement was done.  No chills.  No fever.  Patient is on oxygen.  Does not smoke.  Appetite has been stable.  April 14, 2015 Patient was referred to me because of persistent nausea vomiting not able to eat or drink anything for last 2 weeks. According to patient's family a chest x-ray which was done in the clinic was slightly abnormal.  I do not have report or exudate for my review.  According to son patient has been taking double dose of digoxin.  During last hospitalization patient was advised to take half tablet of the toxin and it appears that patient is taking full tablet.  In now April of 2016 patient had documented digoxin level off for 2.4. Because of persistent nausea and vomiting of blood reason patient's digoxin level was checked which came as 2.8 patient also had high BUN and creatinine and potassium.  In view of all these finding patient was called back to go to emergency room for further evaluation and treatment consideration 7 has a previous history of carcinoma of lung, coronary artery disease  REVIEW OF SYSTEMS:   Gen. status: Declining performance status due to cardiac condition.  HEENT: No evidence of stomatitis or difficulty in swallowing.  Lungs: Increasing shortness of breath due to coronary condition on oxygen.   Cardiac: Recent coronary artery disease status post stent placement GI: No nausea no vomiting.  GU: No dysuria or hematuria.  Musculoskeletal system no bony pains.  Lower extremity 1+ edema.  Neurological system no headache dizziness.  Skin: No rash.  As per HPI. Otherwise, a complete review of systems is negatve.  PAST MEDICAL HISTORY: Past Medical History  Diagnosis Date  . COPD (chronic obstructive pulmonary disease)   . CHF (congestive heart failure)   . Hypertension   . Rapid palpitations 2014    Seen at [facility], may have been atrial fibrillation  . DOE (dyspnea on exertion)     Started after treatment for lung cancer  . High cholesterol   . Heart murmur   . Anginal pain   . Myocardial infarction 12/22/2014     "& probably one ~ 1 wk ago & probably one in ~ 2004" (12/23/2014)  . Asthma   . Pneumonia     "several times in my lifetime" (12/23/2014)  . Lung cancer dx'd 2014    S/P radiation 2015  . Chronic bronchitis     "get it just about q yr" (12/23/2014)  . Sleep apnea     "couldn't use the mask" (12/23/2014)  . Cushing's disease   . Type II diabetes mellitus   . Anemia   . History of blood transfusion     "related to when I was pregnant and a couple times since" (12/23/2014)  . GERD (gastroesophageal reflux disease)   . History  of hiatal hernia   . Migraine     "maybe twice/yr" (12/23/2014)  . Daily headache   . Arthritis     "hands, knees, probably my back" (12/23/2014)  . Gout   . Depression   . Chronic kidney disease (CKD), stage III (moderate)   . Cancer of upper lobe of right lung 01/08/2015    PAST SURGICAL HISTORY: Past Surgical History  Procedure Laterality Date  . Adrenalectomy Left 1980's    "[place]ings"  . Abdominal hysterectomy    . Appendectomy    . Cholecystectomy    . Coronary angioplasty with stent placement  12/23/2014  . Tonsillectomy    . Fracture surgery    . Wrist fracture surgery Bilateral ~ 2000  . Breast cyst excision Left   . Tubal ligation     . Left heart catheterization with coronary angiogram N/A 12/23/2014    Procedure: LEFT HEART CATHETERIZATION WITH CORONARY ANGIOGRAM;  Surgeon: Burnell Blanks, MD;  Location: [facility] CATH LAB;  Service: Cardiovascular;  Laterality: N/A;  . Percutaneous coronary stent intervention (pci-s)  12/23/2014    Procedure: PERCUTANEOUS CORONARY STENT INTERVENTION (PCI-S);  Surgeon: Burnell Blanks, MD;  Location: [facility] CATH LAB;  Service: Cardiovascular;;  Promus 2.25x8    FAMILY HISTORY There is no significant family history of breast cancer, ovarian cancer, colon cancer GYNECOLOGIC HISTORY:  No LMP recorded. Patient has had a hysterectomy.     ADVANCED DIRECTIVES:  Patient does not have any living will or healthcare power of attorney.  Information was given .  Available resources had been discussed.  We will follow-up on subsequent appointments regarding this issue  HEALTH MAINTENANCE: Social History  Substance Use Topics  . Smoking status: Former Smoker -- 1.00 packs/day for 45 years    Types: Cigarettes    Quit date: 04/25/1998  . Smokeless tobacco: Never Used  . Alcohol Use: Yes     Comment: 12/23/2014 "might have a couple mixed drinks/year"      Allergies  Allergen Reactions  . Ciprofloxacin Shortness Of Breath, Itching and Rash  . Doxycycline Shortness Of Breath, Itching and Rash  . Penicillins Shortness Of Breath, Itching and Rash  . Sulfa Antibiotics Shortness Of Breath, Itching and Rash  . Morphine And Related Itching    Current Outpatient Prescriptions  Medication Sig Dispense Refill  . aspirin EC 81 MG tablet Take 1 tablet (81 mg total) by mouth daily. 90 tablet 3  . atorvastatin (LIPITOR) 40 MG tablet Take 1 tablet (40 mg total) by mouth daily. 90 tablet 3  . cholecalciferol (VITAMIN D) 400 UNITS TABS tablet Take 400 Units by mouth daily.    . clopidogrel (PLAVIX) 75 MG tablet Take 1 tablet (75 mg total) by mouth daily. 30 tablet 11  . cyanocobalamin 500 MCG tablet  Take 500 mcg by mouth daily.    . digoxin (LANOXIN) 0.125 MG tablet Take 1 tablet (0.125 mg total) by mouth daily. 90 tablet 3  . diltiazem (DILACOR XR) 180 MG 24 hr capsule Take 180 mg by mouth daily.    . febuxostat (ULORIC) 40 MG tablet Take 40 mg by mouth daily.     . fenofibrate (TRICOR) 48 MG tablet Take 48 mg by mouth daily.    . ferrous sulfate 325 (65 FE) MG EC tablet Take 325 mg by mouth daily.     . metFORMIN (GLUCOPHAGE) 500 MG tablet Take 500 mg by mouth 2 (two) times daily with a meal.    . metoprolol succinate (  TOPROL-XL) 100 MG 24 hr tablet Take 1 tablet (100 mg total) by mouth daily. Take with or immediately following a meal. 30 tablet 11  . nitroGLYCERIN (NITROSTAT) 0.4 MG SL tablet Place 1 tablet (0.4 mg total) under the tongue every 5 (five) minutes as needed for chest pain. 25 tablet 3  . ondansetron (ZOFRAN) 4 MG tablet Take 4 mg by mouth every 8 (eight) hours as needed for nausea or vomiting.     . sertraline (ZOLOFT) 25 MG tablet Take 25 mg by mouth daily.    . valsartan (DIOVAN) 160 MG tablet Take 160 mg by mouth daily.    . vitamin C (ASCORBIC ACID) 500 MG tablet Take 500 mg by mouth daily.    . Multiple Vitamins-Minerals (PRESERVISION AREDS 2) CAPS Take 1 capsule by mouth daily.     No current facility-administered medications for this visit.    OBJECTIVE:  Filed Vitals:   04/14/15 1130  BP: 146/70  Pulse: 57  Temp: 96.4 F (35.8 C)     Body mass index is 25.13 kg/(m^2).    ECOG FS:2 - Symptomatic, <50% confined to bed  PHYSICAL EXAM: Patient is in wheelchair.  Feeling extremely weak and tired. Mucous membranes are dry. Lungs: No crepitation or rhonchi. GI: Abdomen is soft liver and spleen not palpable Lower extremity no edema Skin: No rash Neurological system difficult to evaluate Cranial nerves  are not impaired. Cardiac: Normal heart rhythm with bradycardia    LAB RESULTS:  Admission on 04/14/2015  Component Date Value Ref Range Status  . WBC  04/14/2015 7.1  3.6 - 11.0 K/uL Final  . RBC 04/14/2015 4.23  3.80 - 5.20 MIL/uL Final  . Hemoglobin 04/14/2015 10.5* 12.0 - 16.0 g/dL Final  . HCT 04/14/2015 32.6* 35.0 - 47.0 % Final  . MCV 04/14/2015 77.1* 80.0 - 100.0 fL Final  . MCH 04/14/2015 24.7* 26.0 - 34.0 pg Final  . MCHC 04/14/2015 32.1  32.0 - 36.0 g/dL Final  . RDW 04/14/2015 15.5* 11.5 - 14.5 % Final  . Platelets 04/14/2015 71* 150 - 440 K/uL Final  . Neutrophils Relative % 04/14/2015 76%   Final  . Neutro Abs 04/14/2015 5.4  1.4 - 6.5 K/uL Final  . Lymphocytes Relative 04/14/2015 12%   Final  . Lymphs Abs 04/14/2015 0.8* 1.0 - 3.6 K/uL Final  . Monocytes Relative 04/14/2015 7%   Final  . Monocytes Absolute 04/14/2015 0.5  0.2 - 0.9 K/uL Final  . Eosinophils Relative 04/14/2015 4%   Final  . Eosinophils Absolute 04/14/2015 0.3  0 - 0.7 K/uL Final  . Basophils Relative 04/14/2015 1%   Final  . Basophils Absolute 04/14/2015 0.1  0 - 0.1 K/uL Final  . Sodium 04/14/2015 132* 135 - 145 mmol/L Final  . Potassium 04/14/2015 6.1* 3.5 - 5.1 mmol/L Final  . Chloride 04/14/2015 98* 101 - 111 mmol/L Final  . CO2 04/14/2015 26  22 - 32 mmol/L Final  . Glucose, Bld 04/14/2015 103* 65 - 99 mg/dL Final  . BUN 04/14/2015 51* 6 - 20 mg/dL Final  . Creatinine, Ser 04/14/2015 1.77* 0.44 - 1.00 mg/dL Final  . Calcium 04/14/2015 9.1  8.9 - 10.3 mg/dL Final  . Total Protein 04/14/2015 6.9  6.5 - 8.1 g/dL Final  . Albumin 04/14/2015 4.1  3.5 - 5.0 g/dL Final  . AST 04/14/2015 28  15 - 41 U/L Final  . ALT 04/14/2015 22  14 - 54 U/L Final  . Alkaline Phosphatase 04/14/2015 42  38 - 126 U/L Final  . Total Bilirubin 04/14/2015 0.4  0.3 - 1.2 mg/dL Final  . GFR calc non Af Amer 04/14/2015 26* >60 mL/min Final  . GFR calc Af Amer 04/14/2015 30* >60 mL/min Final   Comment: (NOTE) The eGFR has been calculated using the CKD EPI equation. This calculation has not been validated in all clinical situations. eGFR's persistently <60 mL/min signify  possible Chronic Kidney Disease.   . Anion gap 04/14/2015 8  5 - 15 Final  . Digoxin Level 04/14/2015 2.8* 0.8 - 2.0 ng/mL Final   Comment: RESULTS PREVIOUSLY CALLED WITH BRENDA ELLINGTON ON 04/14/15 AT 1435 BY MLZ/TB.   Marland Kitchen Color, Urine 04/14/2015 YELLOW* YELLOW Final  . APPearance 04/14/2015 CLEAR* CLEAR Final  . Glucose, UA 04/14/2015 NEGATIVE  NEGATIVE mg/dL Final  . Bilirubin Urine 04/14/2015 NEGATIVE  NEGATIVE Final  . Ketones, ur 04/14/2015 NEGATIVE  NEGATIVE mg/dL Final  . Specific Gravity, Urine 04/14/2015 1.013  1.005 - 1.030 Final  . Hgb urine dipstick 04/14/2015 NEGATIVE  NEGATIVE Final  . pH 04/14/2015 5.0  5.0 - 8.0 Final  . Protein, ur 04/14/2015 NEGATIVE  NEGATIVE mg/dL Final  . Nitrite 04/14/2015 NEGATIVE  NEGATIVE Final  . Leukocytes, UA 04/14/2015 NEGATIVE  NEGATIVE Final  . RBC / HPF 04/14/2015 0-5  0 - 5 RBC/hpf Final  . WBC, UA 04/14/2015 0-5  0 - 5 WBC/hpf Final  . Bacteria, UA 04/14/2015 NONE SEEN  NONE SEEN Final  . Squamous Epithelial / LPF 04/14/2015 0-5* NONE SEEN Final  . Mucous 04/14/2015 PRESENT   Final  Office Visit on 04/14/2015  Component Date Value Ref Range Status  . WBC 04/14/2015 7.0  3.6 - 11.0 K/uL Final  . RBC 04/14/2015 4.42  3.80 - 5.20 MIL/uL Final  . Hemoglobin 04/14/2015 11.1* 12.0 - 16.0 g/dL Final  . HCT 04/14/2015 33.6* 35.0 - 47.0 % Final  . MCV 04/14/2015 76.0* 80.0 - 100.0 fL Final  . MCH 04/14/2015 25.2* 26.0 - 34.0 pg Final  . MCHC 04/14/2015 33.1  32.0 - 36.0 g/dL Final  . RDW 04/14/2015 15.2* 11.5 - 14.5 % Final  . Platelets 04/14/2015 75* 150 - 440 K/uL Final  . Neutrophils Relative % 04/14/2015 75   Final  . Neutro Abs 04/14/2015 5.3  1.4 - 6.5 K/uL Final  . Lymphocytes Relative 04/14/2015 12   Final  . Lymphs Abs 04/14/2015 0.8* 1.0 - 3.6 K/uL Final  . Monocytes Relative 04/14/2015 8   Final  . Monocytes Absolute 04/14/2015 0.6  0.2 - 0.9 K/uL Final  . Eosinophils Relative 04/14/2015 4   Final  . Eosinophils Absolute  04/14/2015 0.3  0 - 0.7 K/uL Final  . Basophils Relative 04/14/2015 1   Final  . Basophils Absolute 04/14/2015 0.1  0 - 0.1 K/uL Final  . Sodium 04/14/2015 135  135 - 145 mmol/L Final  . Potassium 04/14/2015 6.1* 3.5 - 5.1 mmol/L Final  . Chloride 04/14/2015 105  101 - 111 mmol/L Final  . CO2 04/14/2015 25  22 - 32 mmol/L Final  . Glucose, Bld 04/14/2015 149* 65 - 99 mg/dL Final  . BUN 04/14/2015 53* 6 - 20 mg/dL Final  . Creatinine, Ser 04/14/2015 1.77* 0.44 - 1.00 mg/dL Final  . Calcium 04/14/2015 8.8* 8.9 - 10.3 mg/dL Final  . Total Protein 04/14/2015 7.1  6.5 - 8.1 g/dL Final  . Albumin 04/14/2015 4.2  3.5 - 5.0 g/dL Final  . AST 04/14/2015 27  15 - 41 U/L  Final  . ALT 04/14/2015 22  14 - 54 U/L Final  . Alkaline Phosphatase 04/14/2015 46  38 - 126 U/L Final  . Total Bilirubin 04/14/2015 0.4  0.3 - 1.2 mg/dL Final  . GFR calc non Af Amer 04/14/2015 26* >60 mL/min Final  . GFR calc Af Amer 04/14/2015 30* >60 mL/min Final   Comment: (NOTE) The eGFR has been calculated using the CKD EPI equation. This calculation has not been validated in all clinical situations. eGFR's persistently <60 mL/min signify possible Chronic Kidney Disease.   . Anion gap 04/14/2015 5  5 - 15 Final  . Digoxin Level 04/14/2015 2.7* 0.8 - 2.0 ng/mL Final   Comment: CRITICAL RESULT CALLED TO, READ BACK BY AND VERIFIED WITH BRENDA ELLINGTON AT 3474 ON 04/14/15 BY MLZ     No results found for: LABCA2 No results found for: CA199 Lab Results  Component Value Date   CEA 0.6 03/24/2013       ASSESSMENT: Carcinoma of lung, non-small cell type.  No evidence of recurrent or progressive disease based on clinical and radio graphic examination History of coronary artery disease. Persistent nausea vomiting  MEDICAL DECISION MAKING:   She was examined by me.  Considering the history the patient has been taking double dose of digoxin.  And considering the fact that 3 months ago patient digoxin level was  slightly elevated.  digoxin level and kidney function.  After patient went home report of digoxin level was available. Digoxin level was 2.8 patient also had impaired kidney function and hyperkalemia The patient was called back and was advised to go to emergency room Most of the patient's symptom may be related to high digoxin level and patient will be further investigated only if those symptoms persist after digoxin level comes back to normal  Cancer of upper lobe of right lung   Staging form: Lung, AJCC 7th Edition     Clinical: T1, N0, M0 - Signed by Forest Gleason, MD on 01/08/2015   Forest Gleason, MD   04/14/2015 9:02 PM

## 2015-04-15 ENCOUNTER — Encounter: Payer: Self-pay | Admitting: Cardiovascular Disease

## 2015-04-15 ENCOUNTER — Telehealth: Payer: Self-pay | Admitting: Cardiovascular Disease

## 2015-04-15 DIAGNOSIS — Z01818 Encounter for other preprocedural examination: Secondary | ICD-10-CM | POA: Insufficient documentation

## 2015-04-15 DIAGNOSIS — N179 Acute kidney failure, unspecified: Secondary | ICD-10-CM | POA: Diagnosis present

## 2015-04-15 DIAGNOSIS — N189 Chronic kidney disease, unspecified: Secondary | ICD-10-CM

## 2015-04-15 LAB — BASIC METABOLIC PANEL
Anion gap: 6 (ref 5–15)
BUN: 41 mg/dL — AB (ref 6–20)
CHLORIDE: 108 mmol/L (ref 101–111)
CO2: 24 mmol/L (ref 22–32)
CREATININE: 1.65 mg/dL — AB (ref 0.44–1.00)
Calcium: 8.5 mg/dL — ABNORMAL LOW (ref 8.9–10.3)
GFR calc Af Amer: 33 mL/min — ABNORMAL LOW (ref >60)
GFR calc non Af Amer: 28 mL/min — ABNORMAL LOW (ref >60)
GLUCOSE: 105 mg/dL — AB (ref 65–99)
POTASSIUM: 5.4 mmol/L — AB (ref 3.5–5.1)
SODIUM: 138 mmol/L (ref 135–145)

## 2015-04-15 LAB — CBC
HEMATOCRIT: 28.5 % — AB (ref 35.0–47.0)
Hemoglobin: 9.2 g/dL — ABNORMAL LOW (ref 12.0–16.0)
MCH: 25.1 pg — ABNORMAL LOW (ref 26.0–34.0)
MCHC: 32.4 g/dL (ref 32.0–36.0)
MCV: 77.4 fL — AB (ref 80.0–100.0)
PLATELETS: 74 10*3/uL — AB (ref 150–440)
RBC: 3.68 MIL/uL — ABNORMAL LOW (ref 3.80–5.20)
RDW: 15.7 % — AB (ref 11.5–14.5)
WBC: 5.7 10*3/uL (ref 3.6–11.0)

## 2015-04-15 LAB — GLUCOSE, CAPILLARY
GLUCOSE-CAPILLARY: 112 mg/dL — AB (ref 65–99)
Glucose-Capillary: 101 mg/dL — ABNORMAL HIGH (ref 65–99)

## 2015-04-15 LAB — DIGOXIN LEVEL: Digoxin Level: 4.8 ng/mL (ref 0.8–2.0)

## 2015-04-15 LAB — POTASSIUM: Potassium: 5.7 mmol/L — ABNORMAL HIGH (ref 3.5–5.1)

## 2015-04-15 MED ORDER — ATORVASTATIN CALCIUM 20 MG PO TABS
40.0000 mg | ORAL_TABLET | Freq: Every day | ORAL | Status: DC
  Administered 2015-04-15: 40 mg via ORAL
  Filled 2015-04-15: qty 2

## 2015-04-15 MED ORDER — DIGOXIN IMMUNE FAB 40 MG IV SOLR
2.0000 | Freq: Once | INTRAVENOUS | Status: AC
  Administered 2015-04-15: 2 via INTRAVENOUS
  Filled 2015-04-15: qty 80

## 2015-04-15 MED ORDER — METOPROLOL SUCCINATE ER 100 MG PO TB24
100.0000 mg | ORAL_TABLET | Freq: Every day | ORAL | Status: DC
  Administered 2015-04-15: 100 mg via ORAL
  Filled 2015-04-15: qty 1

## 2015-04-15 MED ORDER — ACETAMINOPHEN 325 MG PO TABS
650.0000 mg | ORAL_TABLET | Freq: Four times a day (QID) | ORAL | Status: DC | PRN
  Administered 2015-04-15: 650 mg via ORAL
  Filled 2015-04-15: qty 2

## 2015-04-15 MED ORDER — IRBESARTAN 150 MG PO TABS
150.0000 mg | ORAL_TABLET | Freq: Every day | ORAL | Status: DC
  Administered 2015-04-15: 150 mg via ORAL
  Filled 2015-04-15: qty 1

## 2015-04-15 MED ORDER — SODIUM POLYSTYRENE SULFONATE 15 GM/60ML PO SUSP
30.0000 g | Freq: Once | ORAL | Status: AC
  Administered 2015-04-15: 30 g via ORAL
  Filled 2015-04-15 (×2): qty 120

## 2015-04-15 MED ORDER — FEBUXOSTAT 40 MG PO TABS
40.0000 mg | ORAL_TABLET | Freq: Every day | ORAL | Status: DC
  Administered 2015-04-15: 40 mg via ORAL
  Filled 2015-04-15: qty 1

## 2015-04-15 MED ORDER — SODIUM CHLORIDE 0.9 % IV SOLN
3.0000 | Freq: Once | INTRAVENOUS | Status: DC
  Filled 2015-04-15: qty 120

## 2015-04-15 MED ORDER — SODIUM CHLORIDE 0.9 % IJ SOLN
3.0000 mL | Freq: Two times a day (BID) | INTRAMUSCULAR | Status: DC
  Administered 2015-04-15: 3 mL via INTRAVENOUS

## 2015-04-15 MED ORDER — SERTRALINE HCL 50 MG PO TABS
25.0000 mg | ORAL_TABLET | Freq: Every day | ORAL | Status: DC
  Administered 2015-04-15: 25 mg via ORAL
  Filled 2015-04-15: qty 1

## 2015-04-15 MED ORDER — ACETAMINOPHEN 650 MG RE SUPP: 650.0000 mg | Freq: Four times a day (QID) | RECTAL | Status: DC | PRN

## 2015-04-15 MED ORDER — HEPARIN SODIUM (PORCINE) 5000 UNIT/ML IJ SOLN
5000.0000 [IU] | Freq: Three times a day (TID) | INTRAMUSCULAR | Status: DC
  Administered 2015-04-15 (×2): 5000 [IU] via SUBCUTANEOUS
  Filled 2015-04-15 (×2): qty 1

## 2015-04-15 MED ORDER — DILTIAZEM HCL ER 180 MG PO CP24
180.0000 mg | ORAL_CAPSULE | Freq: Every day | ORAL | Status: DC
  Administered 2015-04-15: 180 mg via ORAL
  Filled 2015-04-15 (×2): qty 1

## 2015-04-15 MED ORDER — SODIUM CHLORIDE 0.9 % IV SOLN
INTRAVENOUS | Status: DC
  Administered 2015-04-15: 01:00:00 via INTRAVENOUS

## 2015-04-15 MED ORDER — ASPIRIN EC 81 MG PO TBEC
81.0000 mg | DELAYED_RELEASE_TABLET | Freq: Every day | ORAL | Status: DC
  Administered 2015-04-15: 81 mg via ORAL
  Filled 2015-04-15: qty 1

## 2015-04-15 MED ORDER — INSULIN ASPART 100 UNIT/ML ~~LOC~~ SOLN: 0.0000 [IU] | Freq: Three times a day (TID) | SUBCUTANEOUS | Status: DC

## 2015-04-15 MED ORDER — CLOPIDOGREL BISULFATE 75 MG PO TABS
75.0000 mg | ORAL_TABLET | Freq: Every day | ORAL | Status: DC
  Administered 2015-04-15: 75 mg via ORAL
  Filled 2015-04-15: qty 1

## 2015-04-15 NOTE — Telephone Encounter (Signed)
Pt's son wanted to make sure he knew where patient's new Rx was sent.  Informed him atorvastatin was sent for mail order fill. He voiced understanding, no further questions.

## 2015-04-15 NOTE — Progress Notes (Signed)
Called Dr. Ginette Pitman and informed him of critical lab of Digoxin level being 4.8

## 2015-04-15 NOTE — Progress Notes (Signed)
IV and tele were removed. Discharge instructions and follow-up appointments were given to pt. IV was taken out and pt was escorted down via wheelchair to go home with her family.

## 2015-04-15 NOTE — Care Management (Signed)
Spoke with patient and family concering discharge plans. Patient lives at home with family support. Has Home O2 provided by [employer]. Has a walker and stated that she ambulates well, Anticipate discharge today with no CM needs.

## 2015-04-15 NOTE — Discharge Instructions (Addendum)
Digoxin Toxicity Digoxin is a medicine that can help a weakened heart to function properly. Digoxin increases the strength of the heart muscle, helps to maintain a normal heart rhythm, and helps to remove excess water from the body. When there is too much digoxin in the body, it acts like a poison (toxin). This condition is called digoxin toxicity. CAUSES  Digoxin toxicity can occur due to an accidental overdose. It can also occur if you are taking the correct amount of digoxin but there are other factors affecting the digoxin levels in your body. These factors can include:  Taking other medicines that interact badly with digoxin.  Having low potassium or magnesium levels.  Having reduced kidney function. This prevents digoxin from leaving your body at the normal speed. SYMPTOMS  Confusion.  Changes in color vision (seeing more yellow color), blurred vision, increased sensitivity to light, or seeing flashing lights.  Irregular heartbeats (palpitations). These heartbeats may be too fast or too slow.  Loss of appetite, nausea, vomiting, or diarrhea. DIAGNOSIS  Blood tests may be done to check your digoxin, potassium, and magnesium levels. Electrocardiography may also be done to record the electrical activity of your heart. TREATMENT  Digoxin toxicity is treated in the hospital. To lower your digoxin levels, you may be given a medicine called activated charcoal. Your health care provider may also perform gastric lavage. In this procedure, a tube is inserted through the mouth and into the stomach to clean out the stomach. PREVENTION  The following instructions can help prevent digoxin toxicity:  Take your digoxin medicine exactly as prescribed. If you miss a dose, take it as soon as you can. If it is nearly time for your next dose, take only that 1 dose. Do not take 2 doses at the same time.  Swallow your digoxin medicine with water. It is best to take digoxin on an empty stomach, at least 1  hour before a meal or 2 hours after a meal.  Take your digoxin doses at regular intervals. Do not take your medicine more often than directed.  Tell your health care provider about all other medicines you are taking, including over-the-counter medicines, nutritional supplements, or herbal products.  Check with your health care provider before stopping or starting any medicines.  Do not take antacids or over-the-counter medicines for pain, allergies, coughs, or colds without your health care provider's permission.  Tell your health care provider if you drink caffeine or alcohol, you smoke, or you use street drugs. This may affect the way your digoxin medicine works.  Talk to your health care provider about your diet. The amount of fiber you eat may affect the way your digoxin medicine works.  If you are going to have surgery, tell your surgeon that you are taking digoxin. HOME CARE INSTRUCTIONS  Check your heart rate and blood pressure regularly. Ask your health care provider what your heart rate and blood pressure should be and when you should contact him or her.  Keep all follow-up appointments as directed by your health care provider. You may need to have additional blood tests and an electrocardiogram. SEEK IMMEDIATE MEDICAL CARE IF:   You develop chest pain or shortness of breath.  You have fainting spells or a fast, irregular heartbeat.  You have a slow heartbeat (less than 50 beats per minute). MAKE SURE YOU:   Understand these instructions.  Will watch your condition.  Will get help right away if you are not doing well or get worse. Document  Released: 08/04/2002 Document Revised: 12/29/2013 Document Reviewed: 09/27/2011 [facility] Patient Information 2015 [place], [place]. This information is not intended to replace advice given to you by your health care provider. Make sure you discuss any questions you have with your health care provider.

## 2015-04-15 NOTE — Progress Notes (Signed)
MEDICATION RELATED CONSULT NOTE - INITIAL   Pharmacy Consult for digoxin immune Fab Dosing  Indication: Digoxin Toxicity    Allergies  Allergen Reactions  . Ciprofloxacin Shortness Of Breath, Itching and Rash  . Doxycycline Shortness Of Breath, Itching and Rash  . Penicillins Shortness Of Breath, Itching and Rash  . Sulfa Antibiotics Shortness Of Breath, Itching and Rash  . Morphine And Related Itching    Patient Measurements: Height: '5\' 4"'$  (162.6 cm) Weight: 150 lb (68.04 kg) IBW/kg (Calculated) : 54.7   Vital Signs: Temp: 98.1 F (36.7 C) (08/18 0746) Temp Source: Oral (08/18 0746) BP: 154/54 mmHg (08/18 0746) Pulse Rate: 66 (08/18 0746) Intake/Output from previous day: 08/17 0701 - 08/18 0700 In: 467.5 [I.V.:467.5] Out: 200 [Urine:200] Intake/Output from this shift: Total I/O In: 240 [P.O.:240] Out: 300 [Urine:300]  Labs:  Recent Labs  04/14/15 1219 04/14/15 1700 04/15/15 0629  WBC 7.0 7.1 5.7  HGB 11.1* 10.5* 9.2*  HCT 33.6* 32.6* 28.5*  PLT 75* 71* 74*  CREATININE 1.77* 1.77* 1.65*  ALBUMIN 4.2 4.1  --   PROT 7.1 6.9  --   AST 27 28  --   ALT 22 22  --   ALKPHOS 46 42  --   BILITOT 0.4 0.4  --    Estimated Creatinine Clearance: 26.2 mL/min (by C-G formula based on Cr of 1.65).   Microbiology: No results found for this or any previous visit (from the past 720 hour(s)).  Medical History: Past Medical History  Diagnosis Date  . COPD (chronic obstructive pulmonary disease)   . CHF (congestive heart failure)   . Hypertension   . Rapid palpitations 2014    Seen at [facility], may have been atrial fibrillation  . DOE (dyspnea on exertion)     Started after treatment for lung cancer  . High cholesterol   . Heart murmur   . Anginal pain   . Myocardial infarction 12/22/2014     "& probably one ~ 1 wk ago & probably one in ~ 2004" (12/23/2014)  . Asthma   . Pneumonia     "several times in my lifetime" (12/23/2014)  . Lung cancer dx'd 2014    S/P  radiation 2015  . Chronic bronchitis     "get it just about q yr" (12/23/2014)  . Sleep apnea     "couldn't use the mask" (12/23/2014)  . Cushing's disease   . Type II diabetes mellitus   . Anemia   . History of blood transfusion     "related to when I was pregnant and a couple times since" (12/23/2014)  . GERD (gastroesophageal reflux disease)   . History of hiatal hernia   . Migraine     "maybe twice/yr" (12/23/2014)  . Daily headache   . Arthritis     "hands, knees, probably my back" (12/23/2014)  . Gout   . Depression   . Chronic kidney disease (CKD), stage III (moderate)   . Cancer of upper lobe of right lung 01/08/2015    Medications:  Scheduled:  . aspirin EC  81 mg Oral Daily  . atorvastatin  40 mg Oral QHS  . clopidogrel  75 mg Oral Daily  . digoxin immune fab (DIGIFAB) IV  2 vial Intravenous Once  . diltiazem  180 mg Oral Daily  . febuxostat  40 mg Oral Daily  . heparin  5,000 Units Subcutaneous 3 times per day  . insulin aspart  0-9 Units Subcutaneous TID AC & HS  .  irbesartan  150 mg Oral Daily  . metoprolol succinate  100 mg Oral Daily  . sertraline  25 mg Oral Daily  . sodium chloride  3 mL Intravenous Q12H   Infusions:  . sodium chloride 75 mL/hr at 04/15/15 0031    Assessment: 81 yo female admitted with acute digoxin toxicity. Patient received 2 vials of digoxin immune Fab on 8/17.   Plan:  Patient is clinically better. Will administer 2 vials of digoxin immune Fab. Will order follow-up potassium at 1230 and 1830.   Recommend no further digoxin levels as digoxin immune Fab  will elevate levels but will effectively have bound digoxin. Would expect ~ 7-14 days for patient to clear digoxin/digoxin immune Fab in blood stream.   Will re-dose digoxin immune Fab  if clinically indicated.   Pharmacy will continue to monitor and adjust per consult.   Novice Vrba L 04/15/2015,10:16 AM

## 2015-04-15 NOTE — Progress Notes (Signed)
PROGRESS NOTE  Regina Burke:[id number] DOB: 07-24-1933 DOA: 04/14/2015 PCP: Tracie Harrier, MD  Subjective:  82 y/o f with hx of COPD, CHF, HTN, Paroxysmal A-fib,Ca lung admitted with nausea, vomiting and malaise secondary to digitoxicity and Hyperkalemia Received Digibind in ER. This am feels better. Se Potassium down to 5.4   Objective: BP 154/54 mmHg  Pulse 66  Temp(Src) 98.1 F (36.7 C) (Oral)  Resp 16  Ht '5\' 4"'$  (1.626 m)  Wt 68.04 kg (150 lb)  BMI 25.73 kg/m2  SpO2 100%  Intake/Output Summary (Last 24 hours) at 04/15/15 0817 Last data filed at 04/15/15 0746  Gross per 24 hour  Intake  467.5 ml  Output    200 ml  Net  267.5 ml   Filed Weights   04/14/15 1620  Weight: 68.04 kg (150 lb)    Exam:   General:  NAD  Cardiovascular: S1 S2  Respiratory: Clear to auscultaion  Abdomen: Soft. Non tender  Neuro:Non Focal  Data Reviewed: Basic Metabolic Panel:  Recent Labs Lab 04/14/15 1219 04/14/15 1700 04/15/15 0629  NA 135 132* 138  K 6.1* 6.1* 5.4*  CL 105 98* 108  CO2 '25 26 24  '$ GLUCOSE 149* 103* 105*  BUN 53* 51* 41*  CREATININE 1.77* 1.77* 1.65*  CALCIUM 8.8* 9.1 8.5*   Liver Function Tests:  Recent Labs Lab 04/14/15 1219 04/14/15 1700  AST 27 28  ALT 22 22  ALKPHOS 46 42  BILITOT 0.4 0.4  PROT 7.1 6.9  ALBUMIN 4.2 4.1   No results for input(s): LIPASE, AMYLASE in the last 168 hours. No results for input(s): AMMONIA in the last 168 hours. CBC:  Recent Labs Lab 04/14/15 1219 04/14/15 1700 04/15/15 0629  WBC 7.0 7.1 5.7  NEUTROABS 5.3 5.4  --   HGB 11.1* 10.5* 9.2*  HCT 33.6* 32.6* 28.5*  MCV 76.0* 77.1* 77.4*  PLT 75* 71* 74*   Cardiac Enzymes:   No results for input(s): CKTOTAL, CKMB, CKMBINDEX, TROPONINI in the last 168 hours. BNP (last 3 results)  Recent Labs  12/23/14 0434  BNP 213.8*    ProBNP (last 3 results) No results for input(s): PROBNP in the last 8760 hours.  CBG:  Recent Labs Lab  04/15/15 0724  GLUCAP 101*    No results found for this or any previous visit (from the past 240 hour(s)).   Studies: No results found.  Scheduled Meds: . aspirin EC  81 mg Oral Daily  . atorvastatin  40 mg Oral QHS  . clopidogrel  75 mg Oral Daily  . diltiazem  180 mg Oral Daily  . febuxostat  40 mg Oral Daily  . heparin  5,000 Units Subcutaneous 3 times per day  . insulin aspart  0-9 Units Subcutaneous TID AC & HS  . irbesartan  150 mg Oral Daily  . metoprolol succinate  100 mg Oral Daily  . sertraline  25 mg Oral Daily  . sodium chloride  3 mL Intravenous Q12H    Continuous Infusions: . sodium chloride 75 mL/hr at 04/15/15 0031    Assessment/Plan:  1 Nausea, vomiting and blurred vision secondary to  Digoxin toxicity Pt is symptomatically better- Received Digibind Dig level is 4.8 2  Diabetes mellitus : On SSI 3 Hypertension;Stable- On  4 Paroxysmal A-fib: On Diltiazem and Metoprolol 4 Hyperkalemia; Improved 5 CAD (coronary artery disease): Continue Aspirin and statins 6  Acute on chronic renal failure: Improving- Se Creat; 1,65-Continue IV Hydration 7 Anxiety and depression: On Sertraline  8 Ca Lung: Follows at the cancer center   Code Status: Full        Everett Ricciardelli   04/15/2015, 8:17 AM

## 2015-04-15 NOTE — Discharge Summary (Signed)
Physician Discharge Summary  Regina Burke:[id number] DOB: Feb 18, 1933 DOA: 04/14/2015  PCP: Tracie Harrier, MD  Admit date: 04/14/2015 Discharge date: 04/15/2015  Time spent: 35 minutes  Recommendations for Outpatient Follow-up:  1 Check Digoxin level on Monday 04/19/15  Discharge Diagnoses:    1 Digoxin toxicity  2  Diabetes mellitus without complication  3  Hypertension  4  Hyperkalemia  5 CAD (coronary artery disease)  6  Acute on chronic renal failure  7  Lung Ca    Discharge Condition: Stable  Diet recommendation: 1800 cals  Filed Weights   04/14/15 1620  Weight: 68.04 kg (150 lb)    History of present illness:  Regina Burke is a 81 year old female Regina Burke is a 81 year old female with a history of hypertension lung cancer,COPD, depression type 2 diabetes presenting the ED with complaints of nausea vomiting malaise some blurred vision. Patient seen her cardiologist earlier and was noted to have an elevated digoxin level and was advised to reduce her dose of digoxin from 0.25 mg to 0.125 mg. Reportedly patient had actually been taking 0.5 mg and had not realized that she was doing so. Patient's digoxin level was elevated at 2.8 with a potassium 6.1 and she was given Digibind D in the ED.   Hospital Course:   Patient was admitted [facility]. she felt clinically better with IV Fluids  Her hyperkalemia improved she received another dose of Digibind after repeat digoxin level came back as 4.8. She was however keen to go home she also received a dose of Kayexalate for Hyperkalemia. She was advised to have a repeat digoxin level and metabolic panel done on Monday, 04/19/2015.  She has been encouraged to drink plenty fluids and she was advised to stop her digoxin at this time She will follow me Dr. Ginette Pitman in the  [place] clinic in 1-2 weeks' time    Discharge Exam: Filed Vitals:   04/15/15 0746  BP: 154/54  Pulse: 66  Temp: 98.1 F (36.7  C)  Resp: 16    General: Not in distress Cardiovascular: S1 S2 Respiratory: Clear to ayscultation  Discharge Instructions    Current Discharge Medication List    CONTINUE these medications which have NOT CHANGED   Details  aspirin EC 81 MG tablet Take 1 tablet (81 mg total) by mouth daily. Qty: 90 tablet, Refills: 3    cholecalciferol (VITAMIN D) 400 UNITS TABS tablet Take 400 Units by mouth daily.    clopidogrel (PLAVIX) 75 MG tablet Take 1 tablet (75 mg total) by mouth daily. Qty: 30 tablet, Refills: 11    cyanocobalamin 500 MCG tablet Take 500 mcg by mouth daily.    diltiazem (DILACOR XR) 180 MG 24 hr capsule Take 180 mg by mouth daily.    febuxostat (ULORIC) 40 MG tablet Take 40 mg by mouth daily.     fenofibrate (TRICOR) 48 MG tablet Take 48 mg by mouth daily.    ferrous sulfate 325 (65 FE) MG EC tablet Take 325 mg by mouth daily.     metFORMIN (GLUCOPHAGE) 500 MG tablet Take 500 mg by mouth 2 (two) times daily with a meal.    metoprolol succinate (TOPROL-XL) 100 MG 24 hr tablet Take 1 tablet (100 mg total) by mouth daily. Take with or immediately following a meal. Qty: 30 tablet, Refills: 11    Multiple Vitamins-Minerals (PRESERVISION AREDS 2) CAPS Take 1 capsule by mouth daily.    nitroGLYCERIN (NITROSTAT) 0.4 MG SL tablet Place 1 tablet (0.4  mg total) under the tongue every 5 (five) minutes as needed for chest pain. Qty: 25 tablet, Refills: 3    sertraline (ZOLOFT) 25 MG tablet Take 25 mg by mouth daily.    valsartan (DIOVAN) 160 MG tablet Take 160 mg by mouth daily.    vitamin C (ASCORBIC ACID) 500 MG tablet Take 500 mg by mouth daily.    atorvastatin (LIPITOR) 40 MG tablet Take 1 tablet (40 mg total) by mouth daily. Qty: 90 tablet, Refills: 3      STOP taking these medications     digoxin (LANOXIN) 0.125 MG tablet      ondansetron (ZOFRAN) 4 MG tablet        Allergies  Allergen Reactions  . Ciprofloxacin Shortness Of Breath, Itching and Rash   . Doxycycline Shortness Of Breath, Itching and Rash  . Penicillins Shortness Of Breath, Itching and Rash  . Sulfa Antibiotics Shortness Of Breath, Itching and Rash  . Morphine And Related Itching      The results of significant diagnostics from this hospitalization (including imaging, microbiology, ancillary and laboratory) are listed below for reference.    Significant Diagnostic Studies: No results found.  Microbiology: No results found for this or any previous visit (from the past 240 hour(s)).   Labs: Basic Metabolic Panel:  Recent Labs Lab 04/14/15 1219 04/14/15 1700 04/15/15 0629 04/15/15 1248  NA 135 132* 138  --   K 6.1* 6.1* 5.4* 5.7*  CL 105 98* 108  --   CO2 '25 26 24  '$ --   GLUCOSE 149* 103* 105*  --   BUN 53* 51* 41*  --   CREATININE 1.77* 1.77* 1.65*  --   CALCIUM 8.8* 9.1 8.5*  --    Liver Function Tests:  Recent Labs Lab 04/14/15 1219 04/14/15 1700  AST 27 28  ALT 22 22  ALKPHOS 46 42  BILITOT 0.4 0.4  PROT 7.1 6.9  ALBUMIN 4.2 4.1   No results for input(s): LIPASE, AMYLASE in the last 168 hours. No results for input(s): AMMONIA in the last 168 hours. CBC:  Recent Labs Lab 04/14/15 1219 04/14/15 1700 04/15/15 0629  WBC 7.0 7.1 5.7  NEUTROABS 5.3 5.4  --   HGB 11.1* 10.5* 9.2*  HCT 33.6* 32.6* 28.5*  MCV 76.0* 77.1* 77.4*  PLT 75* 71* 74*   Cardiac Enzymes: No results for input(s): CKTOTAL, CKMB, CKMBINDEX, TROPONINI in the last 168 hours. BNP: BNP (last 3 results)  Recent Labs  12/23/14 0434  BNP 213.8*    ProBNP (last 3 results) No results for input(s): PROBNP in the last 8760 hours.  CBG:  Recent Labs Lab 04/15/15 0724 04/15/15 1133  GLUCAP 101* 112*       Signed:  Jhamir Pickup   04/15/2015, 1:57 PM

## 2015-04-15 NOTE — Telephone Encounter (Signed)
Please call,question about medicine. Thank Dr Claiborne Billings for saving his mother's life. She was taking too much medicine,the Digoxin that he told her to decrease ,was the one.

## 2015-04-15 NOTE — Progress Notes (Signed)
Patient ID: Regina Burke, female   DOB: Feb 23, 1933, 81 y.o.   MRN: [id number]     HPI: Regina Burke is a 81 y.o. female who presents to the office today for a  follow up cardiology evaluation after her recent [facility] hospitalization from April 2016.  Regina Burke has a history of atrial fibrillation, COPD, lung cancer, diabetes mellitus, and recently was admitted to [facility] hospital on 12/22/2014 with chest pain.  Initially a code STEMI was called but this was canceled as her ECG was not diagnostic.  The following day she underwent elective cardiac catheterization by Dr. Zachery Conch and was found to have mild 50% disease in the diagonal branch of the LAD, but high-grade 90% hazy stenosis in the marginal branch of the circumflex.  She underwent successful PTCA and insertion of a 2.25x 8 millimeter Promus premier DES stent at the ostium of the OM3 vessell which was postdilated to 2.5 mm.  Socially, she has done well without recurrent chest pain symptomatology.  She states that she has planned cataract surgery to be done at [facility] by Dr. Lyla Glassing in the near future.  She is also followed primarily by Dr. Halford Decamp.  She presents for cardiology evaluation.  She denies recurrent chest pain.  She is unaware of palpitations.  There is no presyncope or syncope.  She denies bleeding.  She has been on aspirin and Plavix for dual platelet therapy.  Following insertion of her DES stent.  She is on fenofibrate only at 48.  No grams and noteworthy is her laboratory from April which showed a total cholesterol 202, triglycerides 337, HDL 28, and LDL cholesterol 107.  She has GERD for which he takes Protonix.  She has been taking digoxin 0.25 mg, still according 180 mg and metoprolol succinate 100 mg daily.  Past Medical History  Diagnosis Date  . COPD (chronic obstructive pulmonary disease)   . CHF (congestive heart failure)   . Hypertension   . Rapid palpitations 2014    Seen at [facility], may have  been atrial fibrillation  . DOE (dyspnea on exertion)     Started after treatment for lung cancer  . High cholesterol   . Heart murmur   . Anginal pain   . Myocardial infarction 12/22/2014     "& probably one ~ 1 wk ago & probably one in ~ 2004" (12/23/2014)  . Asthma   . Pneumonia     "several times in my lifetime" (12/23/2014)  . Lung cancer dx'd 2014    S/P radiation 2015  . Chronic bronchitis     "get it just about q yr" (12/23/2014)  . Sleep apnea     "couldn't use the mask" (12/23/2014)  . Cushing's disease   . Type II diabetes mellitus   . Anemia   . History of blood transfusion     "related to when I was pregnant and a couple times since" (12/23/2014)  . GERD (gastroesophageal reflux disease)   . History of hiatal hernia   . Migraine     "maybe twice/yr" (12/23/2014)  . Daily headache   . Arthritis     "hands, knees, probably my back" (12/23/2014)  . Gout   . Depression   . Chronic kidney disease (CKD), stage III (moderate)   . Cancer of upper lobe of right lung 01/08/2015    Past Surgical History  Procedure Laterality Date  . Adrenalectomy Left 1980's    "[place]ings"  . Abdominal hysterectomy    .  Appendectomy    . Cholecystectomy    . Coronary angioplasty with stent placement  12/23/2014  . Tonsillectomy    . Fracture surgery    . Wrist fracture surgery Bilateral ~ 2000  . Breast cyst excision Left   . Tubal ligation    . Left heart catheterization with coronary angiogram N/A 12/23/2014    Procedure: LEFT HEART CATHETERIZATION WITH CORONARY ANGIOGRAM;  Surgeon: Burnell Blanks, MD;  Location: [facility] CATH LAB;  Service: Cardiovascular;  Laterality: N/A;  . Percutaneous coronary stent intervention (pci-s)  12/23/2014    Procedure: PERCUTANEOUS CORONARY STENT INTERVENTION (PCI-S);  Surgeon: Burnell Blanks, MD;  Location: [facility] C[facility] LAB;  Service: Cardiovascular;;  Promus 2.25x8    Allergies  Allergen Reactions  . Ciprofloxacin Shortness Of Breath, Itching and  Rash  . Doxycycline Shortness Of Breath, Itching and Rash  . Penicillins Shortness Of Breath, Itching and Rash  . Sulfa Antibiotics Shortness Of Breath, Itching and Rash  . Morphine And Related Itching    Current Outpatient Prescriptions  Medication Sig Dispense Refill  . cholecalciferol (VITAMIN D) 400 UNITS TABS tablet Take 400 Units by mouth daily.    . clopidogrel (PLAVIX) 75 MG tablet Take 1 tablet (75 mg total) by mouth daily. 30 tablet 11  . cyanocobalamin 500 MCG tablet Take 500 mcg by mouth daily.    Marland Kitchen diltiazem (DILACOR XR) 180 MG 24 hr capsule Take 180 mg by mouth daily.    . febuxostat (ULORIC) 40 MG tablet Take 40 mg by mouth daily.     . fenofibrate (TRICOR) 48 MG tablet Take 48 mg by mouth daily.    . ferrous sulfate 325 (65 FE) MG EC tablet Take 325 mg by mouth daily.     . metFORMIN (GLUCOPHAGE) 500 MG tablet Take 500 mg by mouth 2 (two) times daily with a meal.    . metoprolol succinate (TOPROL-XL) 100 MG 24 hr tablet Take 1 tablet (100 mg total) by mouth daily. Take with or immediately following a meal. 30 tablet 11  . nitroGLYCERIN (NITROSTAT) 0.4 MG SL tablet Place 1 tablet (0.4 mg total) under the tongue every 5 (five) minutes as needed for chest pain. 25 tablet 3  . sertraline (ZOLOFT) 25 MG tablet Take 25 mg by mouth daily.    . valsartan (DIOVAN) 160 MG tablet Take 160 mg by mouth daily.    . vitamin C (ASCORBIC ACID) 500 MG tablet Take 500 mg by mouth daily.    Marland Kitchen aspirin EC 81 MG tablet Take 1 tablet (81 mg total) by mouth daily. 90 tablet 3  . atorvastatin (LIPITOR) 40 MG tablet Take 1 tablet (40 mg total) by mouth daily. 90 tablet 3  . Multiple Vitamins-Minerals (PRESERVISION AREDS 2) CAPS Take 1 capsule by mouth daily.     No current facility-administered medications for this visit.   Facility-Administered Medications Ordered in Other Visits  Medication Dose Route Frequency Provider Last Rate Last Dose  . acetaminophen (TYLENOL) tablet 650 mg  650 mg Oral  Q6H PRN Lance Coon, MD   650 mg at 04/15/15 1323   Or  . acetaminophen (TYLENOL) suppository 650 mg  650 mg Rectal Q6H PRN Lance Coon, MD      . aspirin EC tablet 81 mg  81 mg Oral Daily Lance Coon, MD   81 mg at 04/15/15 1005  . atorvastatin (LIPITOR) tablet 40 mg  40 mg Oral QHS Lance Coon, MD   40 mg at 04/15/15 0031  .  clopidogrel (PLAVIX) tablet 75 mg  75 mg Oral Daily Lance Coon, MD   75 mg at 04/15/15 1005  . diltiazem (DILACOR XR) 24 hr capsule 180 mg  180 mg Oral Daily Lance Coon, MD   180 mg at 04/15/15 1005  . febuxostat (ULORIC) tablet 40 mg  40 mg Oral Daily Lance Coon, MD   40 mg at 04/15/15 1027  . heparin injection 5,000 Units  5,000 Units Subcutaneous 3 times per day Lance Coon, MD   5,000 Units at 04/15/15 1456  . insulin aspart (novoLOG) injection 0-9 Units  0-9 Units Subcutaneous TID AC & HS Lance Coon, MD   0 Units at 04/15/15 0735  . irbesartan (AVAPRO) tablet 150 mg  150 mg Oral Daily Lance Coon, MD   150 mg at 04/15/15 1005  . metoprolol succinate (TOPROL-XL) 24 hr tablet 100 mg  100 mg Oral Daily Lance Coon, MD   100 mg at 04/15/15 1005  . sertraline (ZOLOFT) tablet 25 mg  25 mg Oral Daily Lance Coon, MD   25 mg at 04/15/15 1027  . sodium chloride 0.9 % injection 3 mL  3 mL Intravenous Q12H Lance Coon, MD   3 mL at 04/15/15 0031    Social History   Social History  . Marital Status: Widowed    Spouse Name: N/A  . Number of Children: N/A  . Years of Education: N/A   Occupational History  . Not on file.   Social History Main Topics  . Smoking status: Former Smoker -- 1.00 packs/day for 45 years    Types: Cigarettes    Quit date: 04/25/1998  . Smokeless tobacco: Never Used  . Alcohol Use: Yes     Comment: 12/23/2014 "might have a couple mixed drinks/year"  . Drug Use: No  . Sexual Activity: No   Other Topics Concern  . Not on file   Social History Narrative    Family History  Problem Relation Age of Onset  . Heart disease  Mother   . Diabetes Mother   . Osteoarthritis Mother   . Hypertension Mother   . Heart disease Father   . Hypertension Father   . COPD Brother     ROS General: Negative; No fevers, chills, or night sweats HEENT: Negative; No changes in vision or hearing, sinus congestion, difficulty swallowing Pulmonary: Negative; No cough, wheezing, shortness of breath, hemoptysis Cardiovascular: See HPI: No chest pain, presyncope, syncope, palpatations GI: Negative; No nausea, vomiting, diarrhea, or abdominal pain GU: Negative; No dysuria, hematuria, or difficulty voiding Musculoskeletal: Negative; no myalgias, joint pain, or weakness Hematologic: Negative; no easy bruising, bleeding Endocrine: Negative; no heat/cold intolerance; no diabetes, Neuro: Negative; no changes in balance, headaches Skin: Negative; No rashes or skin lesions Psychiatric: Negative; No behavioral problems, depression Sleep: Negative; No snoring,  daytime sleepiness, hypersomnolence, bruxism, restless legs, hypnogognic hallucinations. Other comprehensive 14 point system review is negative   Physical Exam BP 146/60 mmHg  Pulse 65  Ht $R'5\' 5"'SI$  (1.651 m)  Wt 148 lb 11.2 oz (67.45 kg)  BMI 24.75 kg/m2 Wt Readings from Last 3 Encounters:  04/14/15 150 lb (68.04 kg)  04/14/15 151 lb (68.493 kg)  04/13/15 148 lb 11.2 oz (67.45 kg)   General: Alert, oriented, no distress.  Skin: normal turgor, no rashes, warm and dry HEENT: Normocephalic, atraumatic. Pupils equal round and reactive to light; sclera anicteric; extraocular muscles intact, No lid lag; Nose without nasal septal hypertrophy; Mouth/Parynx benign; Mallinpatti scale 3 Neck: No JVD, no  carotid bruits; normal carotid upstroke Lungs: clear to ausculatation and percussion bilaterally; no wheezing or rales, normal inspiratory and expiratory effort Chest wall: without tenderness to palpitation Heart: PMI not displaced, RRR, s1 s2 normal, 1/6 systolic murmur, No diastolic  murmur, no rubs, gallops, thrills, or heaves Abdomen: soft, nontender; no hepatosplenomehaly, BS+; abdominal aorta nontender and not dilated by palpation. Back: no CVA tenderness Pulses: 2+  Musculoskeletal: full range of motion, normal strength, no joint deformities Extremities: Pulses 2+, no clubbing cyanosis or edema, Homan's sign negative  Neurologic: grossly nonfocal; Cranial nerves grossly wnl Psychologic: Normal mood and affect   ECG (independently read by me): Normal sinus rhythm at 65 bpm.  Left bundle branch block with repolarization changes.  LABS:  BMP Latest Ref Rng 04/15/2015 04/15/2015 04/14/2015  Glucose 65 - 99 mg/dL - 105(H) 103(H)  BUN 6 - 20 mg/dL - 41(H) 51(H)  Creatinine 0.44 - 1.00 mg/dL - 1.65(H) 1.77(H)  Sodium 135 - 145 mmol/L - 138 132(L)  Potassium 3.5 - 5.1 mmol/L 5.7(H) 5.4(H) 6.1(H)  Chloride 101 - 111 mmol/L - 108 98(L)  CO2 22 - 32 mmol/L - 24 26  Calcium 8.9 - 10.3 mg/dL - 8.5(L) 9.1     Hepatic Function Latest Ref Rng 04/14/2015 04/14/2015 12/24/2014  Total Protein 6.5 - 8.1 g/dL 6.9 7.1 5.8(L)  Albumin 3.5 - 5.0 g/dL 4.1 4.2 3.2(L)  AST 15 - 41 U/L $Remo'28 27 25  'qQFLt$ ALT 14 - 54 U/L $Remo'22 22 13  'oHxMK$ Alk Phosphatase 38 - 126 U/L 42 46 38(L)  Total Bilirubin 0.3 - 1.2 mg/dL 0.4 0.4 0.5  Bilirubin, Direct 0.0 - 0.5 mg/dL - - 0.1    CBC Latest Ref Rng 04/15/2015 04/14/2015 04/14/2015  WBC 3.6 - 11.0 K/uL 5.7 7.1 7.0  Hemoglobin 12.0 - 16.0 g/dL 9.2(L) 10.5(L) 11.1(L)  Hematocrit 35.0 - 47.0 % 28.5(L) 32.6(L) 33.6(L)  Platelets 150 - 440 K/uL 74(L) 71(L) 75(L)   Lab Results  Component Value Date   MCV 77.4* 04/15/2015   MCV 77.1* 04/14/2015   MCV 76.0* 04/14/2015    Lab Results  Component Value Date   TSH 1.995 12/23/2014    BNP    Component Value Date/Time   BNP 213.8* 12/23/2014 0434    ProBNP No results found for: PROBNP   Lipid Panel     Component Value Date/Time   CHOL 202* 12/23/2014 0434   TRIG 337* 12/23/2014 0434   HDL 28* 12/23/2014  0434   CHOLHDL 7.2 12/23/2014 0434   VLDL 67* 12/23/2014 0434   LDLCALC 107* 12/23/2014 0434     RADIOLOGY: No results found.    ASSESSMENT AND PLAN: Ms. Detore is a 81 year old female who has a history of paroxysmal atrial fibrillation, remote lung cancer, COPD, diabetes mellitus and hypertension.  She is on home oxygen.  She is status post DES stenting to a high-grade 90% hazy stenosis in the OM1 3 branch of her left circumflex coronary artery and has additional mild concomitant CAD.  She has not had recurrent anginal symptomatology since her intervention.  She tells me she will be undergoing cataract surgery.  She is still only 4 months since insertion of her DES stent and as long as she does not need to hold  aspirin and Plavix.  She will be given clearance for her cataract surgery.  If this has to be held, then I would recommend deferring her procedure for at least several more months.  She did have significant hyperlipidemia and I'm electing  to add atorvastatin 40 mg total medical regimen for more aggressive lipid-lowering.  I am adding a baby aspirin 81 mg to her Plavix.  I am decreasing her digoxin to 0.125 mg.  I have recommended repeat blood work.  Adjustments to her medical therapy will be done if needed.  I will see her in 4 months for reevaluation.   Troy Sine, MD, [facility]  04/15/2015 6:19 PM

## 2015-05-08 ENCOUNTER — Observation Stay
Admission: EM | Admit: 2015-05-08 | Discharge: 2015-05-11 | Disposition: A | Discharge status: Home / Self Care | Payer: Medicare Other | Attending: Internal Medicine | Admitting: Internal Medicine

## 2015-05-08 ENCOUNTER — Emergency Department: Payer: Medicare Other

## 2015-05-08 ENCOUNTER — Encounter: Payer: Self-pay | Admitting: Emergency Medicine

## 2015-05-08 DIAGNOSIS — N183 Chronic kidney disease, stage 3 (moderate): Secondary | ICD-10-CM | POA: Diagnosis not present

## 2015-05-08 DIAGNOSIS — F329 Major depressive disorder, single episode, unspecified: Secondary | ICD-10-CM | POA: Insufficient documentation

## 2015-05-08 DIAGNOSIS — Z9049 Acquired absence of other specified parts of digestive tract: Secondary | ICD-10-CM | POA: Insufficient documentation

## 2015-05-08 DIAGNOSIS — Z79899 Other long term (current) drug therapy: Secondary | ICD-10-CM | POA: Insufficient documentation

## 2015-05-08 DIAGNOSIS — J449 Chronic obstructive pulmonary disease, unspecified: Secondary | ICD-10-CM | POA: Diagnosis not present

## 2015-05-08 DIAGNOSIS — G4730 Sleep apnea, unspecified: Secondary | ICD-10-CM | POA: Diagnosis not present

## 2015-05-08 DIAGNOSIS — Z880 Allergy status to penicillin: Secondary | ICD-10-CM | POA: Diagnosis not present

## 2015-05-08 DIAGNOSIS — Z885 Allergy status to narcotic agent status: Secondary | ICD-10-CM | POA: Diagnosis not present

## 2015-05-08 DIAGNOSIS — Z85118 Personal history of other malignant neoplasm of bronchus and lung: Secondary | ICD-10-CM | POA: Diagnosis not present

## 2015-05-08 DIAGNOSIS — Z23 Encounter for immunization: Secondary | ICD-10-CM | POA: Diagnosis not present

## 2015-05-08 DIAGNOSIS — Z8249 Family history of ischemic heart disease and other diseases of the circulatory system: Secondary | ICD-10-CM | POA: Insufficient documentation

## 2015-05-08 DIAGNOSIS — Z881 Allergy status to other antibiotic agents status: Secondary | ICD-10-CM | POA: Diagnosis not present

## 2015-05-08 DIAGNOSIS — I252 Old myocardial infarction: Secondary | ICD-10-CM | POA: Insufficient documentation

## 2015-05-08 DIAGNOSIS — I2510 Atherosclerotic heart disease of native coronary artery without angina pectoris: Secondary | ICD-10-CM | POA: Diagnosis not present

## 2015-05-08 DIAGNOSIS — Z955 Presence of coronary angioplasty implant and graft: Secondary | ICD-10-CM | POA: Diagnosis not present

## 2015-05-08 DIAGNOSIS — W010XXA Fall on same level from slipping, tripping and stumbling without subsequent striking against object, initial encounter: Secondary | ICD-10-CM | POA: Insufficient documentation

## 2015-05-08 DIAGNOSIS — R531 Weakness: Secondary | ICD-10-CM | POA: Insufficient documentation

## 2015-05-08 DIAGNOSIS — E860 Dehydration: Secondary | ICD-10-CM | POA: Diagnosis present

## 2015-05-08 DIAGNOSIS — R9431 Abnormal electrocardiogram [ECG] [EKG]: Secondary | ICD-10-CM | POA: Diagnosis present

## 2015-05-08 DIAGNOSIS — K219 Gastro-esophageal reflux disease without esophagitis: Secondary | ICD-10-CM | POA: Diagnosis not present

## 2015-05-08 DIAGNOSIS — Z87891 Personal history of nicotine dependence: Secondary | ICD-10-CM | POA: Diagnosis not present

## 2015-05-08 DIAGNOSIS — M479 Spondylosis, unspecified: Secondary | ICD-10-CM | POA: Insufficient documentation

## 2015-05-08 DIAGNOSIS — D631 Anemia in chronic kidney disease: Secondary | ICD-10-CM | POA: Diagnosis present

## 2015-05-08 DIAGNOSIS — H578 Other specified disorders of eye and adnexa: Secondary | ICD-10-CM | POA: Diagnosis not present

## 2015-05-08 DIAGNOSIS — E1122 Type 2 diabetes mellitus with diabetic chronic kidney disease: Secondary | ICD-10-CM | POA: Diagnosis not present

## 2015-05-08 DIAGNOSIS — R040 Epistaxis: Secondary | ICD-10-CM | POA: Insufficient documentation

## 2015-05-08 DIAGNOSIS — D649 Anemia, unspecified: Secondary | ICD-10-CM | POA: Insufficient documentation

## 2015-05-08 DIAGNOSIS — Z825 Family history of asthma and other chronic lower respiratory diseases: Secondary | ICD-10-CM | POA: Diagnosis not present

## 2015-05-08 DIAGNOSIS — I129 Hypertensive chronic kidney disease with stage 1 through stage 4 chronic kidney disease, or unspecified chronic kidney disease: Secondary | ICD-10-CM | POA: Diagnosis not present

## 2015-05-08 DIAGNOSIS — E780 Pure hypercholesterolemia: Secondary | ICD-10-CM | POA: Diagnosis not present

## 2015-05-08 DIAGNOSIS — Z7982 Long term (current) use of aspirin: Secondary | ICD-10-CM | POA: Diagnosis not present

## 2015-05-08 DIAGNOSIS — Z833 Family history of diabetes mellitus: Secondary | ICD-10-CM | POA: Diagnosis not present

## 2015-05-08 DIAGNOSIS — Z7902 Long term (current) use of antithrombotics/antiplatelets: Secondary | ICD-10-CM | POA: Diagnosis not present

## 2015-05-08 DIAGNOSIS — R296 Repeated falls: Secondary | ICD-10-CM | POA: Diagnosis not present

## 2015-05-08 DIAGNOSIS — S022XXA Fracture of nasal bones, initial encounter for closed fracture: Secondary | ICD-10-CM | POA: Diagnosis not present

## 2015-05-08 DIAGNOSIS — N189 Chronic kidney disease, unspecified: Secondary | ICD-10-CM | POA: Diagnosis present

## 2015-05-08 DIAGNOSIS — I509 Heart failure, unspecified: Secondary | ICD-10-CM | POA: Insufficient documentation

## 2015-05-08 DIAGNOSIS — E875 Hyperkalemia: Secondary | ICD-10-CM | POA: Diagnosis present

## 2015-05-08 DIAGNOSIS — Z9889 Other specified postprocedural states: Secondary | ICD-10-CM | POA: Diagnosis not present

## 2015-05-08 DIAGNOSIS — I482 Chronic atrial fibrillation: Secondary | ICD-10-CM | POA: Insufficient documentation

## 2015-05-08 DIAGNOSIS — S0083XA Contusion of other part of head, initial encounter: Secondary | ICD-10-CM

## 2015-05-08 DIAGNOSIS — M109 Gout, unspecified: Secondary | ICD-10-CM | POA: Insufficient documentation

## 2015-05-08 DIAGNOSIS — Z882 Allergy status to sulfonamides status: Secondary | ICD-10-CM | POA: Diagnosis not present

## 2015-05-08 DIAGNOSIS — S0003XA Contusion of scalp, initial encounter: Secondary | ICD-10-CM | POA: Diagnosis not present

## 2015-05-08 DIAGNOSIS — E119 Type 2 diabetes mellitus without complications: Secondary | ICD-10-CM

## 2015-05-08 DIAGNOSIS — W19XXXA Unspecified fall, initial encounter: Secondary | ICD-10-CM

## 2015-05-08 DIAGNOSIS — S0993XA Unspecified injury of face, initial encounter: Secondary | ICD-10-CM | POA: Insufficient documentation

## 2015-05-08 LAB — BASIC METABOLIC PANEL
ANION GAP: 5 (ref 5–15)
BUN: 49 mg/dL — ABNORMAL HIGH (ref 6–20)
CALCIUM: 9 mg/dL (ref 8.9–10.3)
CO2: 24 mmol/L (ref 22–32)
Chloride: 108 mmol/L (ref 101–111)
Creatinine, Ser: 1.37 mg/dL — ABNORMAL HIGH (ref 0.44–1.00)
GFR, EST AFRICAN AMERICAN: 41 mL/min — AB (ref >60)
GFR, EST NON AFRICAN AMERICAN: 36 mL/min — AB (ref >60)
Glucose, Bld: 165 mg/dL — ABNORMAL HIGH (ref 65–99)
Potassium: 5.7 mmol/L — ABNORMAL HIGH (ref 3.5–5.1)
Sodium: 137 mmol/L (ref 135–145)

## 2015-05-08 LAB — CBC
HCT: 31.9 % — ABNORMAL LOW (ref 35.0–47.0)
HEMOGLOBIN: 10.4 g/dL — AB (ref 12.0–16.0)
MCH: 25.5 pg — AB (ref 26.0–34.0)
MCHC: 32.7 g/dL (ref 32.0–36.0)
MCV: 77.9 fL — AB (ref 80.0–100.0)
Platelets: 188 10*3/uL (ref 150–440)
RBC: 4.1 MIL/uL (ref 3.80–5.20)
RDW: 16.5 % — ABNORMAL HIGH (ref 11.5–14.5)
WBC: 11.9 10*3/uL — ABNORMAL HIGH (ref 3.6–11.0)

## 2015-05-08 LAB — URINALYSIS COMPLETE WITH MICROSCOPIC (ARMC ONLY)
Bilirubin Urine: NEGATIVE
Glucose, UA: NEGATIVE mg/dL
Hgb urine dipstick: NEGATIVE
Ketones, ur: NEGATIVE mg/dL
Leukocytes, UA: NEGATIVE
Nitrite: NEGATIVE
PH: 5 (ref 5.0–8.0)
PROTEIN: NEGATIVE mg/dL
SQUAMOUS EPITHELIAL / LPF: NONE SEEN
Specific Gravity, Urine: 1.011 (ref 1.005–1.030)

## 2015-05-08 LAB — TROPONIN I: Troponin I: <0.03 ng/mL (ref <0.031)

## 2015-05-08 LAB — GLUCOSE, CAPILLARY
GLUCOSE-CAPILLARY: 147 mg/dL — AB (ref 65–99)
GLUCOSE-CAPILLARY: 137 mg/dL — AB (ref 65–99)

## 2015-05-08 LAB — DIGOXIN LEVEL: DIGOXIN LVL: 1.4 ng/mL (ref 0.8–2.0)

## 2015-05-08 MED ORDER — DILTIAZEM HCL ER 180 MG PO CP24
180.0000 mg | ORAL_CAPSULE | Freq: Every day | ORAL | Status: DC
  Administered 2015-05-08 – 2015-05-10 (×3): 180 mg via ORAL
  Filled 2015-05-08 (×7): qty 1

## 2015-05-08 MED ORDER — ATORVASTATIN CALCIUM 20 MG PO TABS: 40.0000 mg | ORAL_TABLET | Freq: Every day | ORAL | Status: DC

## 2015-05-08 MED ORDER — FEBUXOSTAT 40 MG PO TABS
40.0000 mg | ORAL_TABLET | Freq: Every day | ORAL | Status: DC
  Administered 2015-05-08 – 2015-05-10 (×3): 40 mg via ORAL
  Filled 2015-05-08 (×4): qty 1

## 2015-05-08 MED ORDER — HYDROCODONE-ACETAMINOPHEN 5-325 MG PO TABS: 1.0000 | ORAL_TABLET | ORAL | Status: DC | PRN

## 2015-05-08 MED ORDER — CLOPIDOGREL BISULFATE 75 MG PO TABS
75.0000 mg | ORAL_TABLET | Freq: Every day | ORAL | Status: DC
  Administered 2015-05-08 – 2015-05-10 (×3): 75 mg via ORAL
  Filled 2015-05-08 (×3): qty 1

## 2015-05-08 MED ORDER — IRBESARTAN 75 MG PO TABS: 37.5000 mg | ORAL_TABLET | Freq: Every day | ORAL | Status: DC

## 2015-05-08 MED ORDER — SODIUM CHLORIDE 0.9 % IV SOLN
INTRAVENOUS | Status: DC
  Administered 2015-05-08 – 2015-05-10 (×5): via INTRAVENOUS

## 2015-05-08 MED ORDER — HYDROCODONE-ACETAMINOPHEN 5-325 MG PO TABS
1.0000 | ORAL_TABLET | Freq: Four times a day (QID) | ORAL | Status: DC | PRN
  Administered 2015-05-08 – 2015-05-10 (×5): 1 via ORAL
  Filled 2015-05-08 (×5): qty 1

## 2015-05-08 MED ORDER — ENOXAPARIN SODIUM 40 MG/0.4ML ~~LOC~~ SOLN
40.0000 mg | SUBCUTANEOUS | Status: DC
  Administered 2015-05-08: 15:00:00 40 mg via SUBCUTANEOUS
  Filled 2015-05-08: qty 0.4

## 2015-05-08 MED ORDER — SODIUM CHLORIDE 0.9 % IV BOLUS (SEPSIS)
500.0000 mL | Freq: Once | INTRAVENOUS | Status: AC
  Administered 2015-05-08: 500 mL via INTRAVENOUS

## 2015-05-08 MED ORDER — HYDROCODONE-ACETAMINOPHEN 5-325 MG PO TABS
1.0000 | ORAL_TABLET | Freq: Once | ORAL | Status: AC
  Administered 2015-05-08: 1 via ORAL
  Filled 2015-05-08: qty 1

## 2015-05-08 MED ORDER — ACETAMINOPHEN 325 MG PO TABS: 650.0000 mg | ORAL_TABLET | Freq: Four times a day (QID) | ORAL | Status: DC | PRN

## 2015-05-08 MED ORDER — OCUVITE-LUTEIN PO CAPS
1.0000 | ORAL_CAPSULE | Freq: Every day | ORAL | Status: DC
  Administered 2015-05-08 – 2015-05-10 (×3): 1 via ORAL
  Filled 2015-05-08 (×3): qty 1

## 2015-05-08 MED ORDER — SODIUM POLYSTYRENE SULFONATE 15 GM/60ML PO SUSP
15.0000 g | Freq: Once | ORAL | Status: AC
  Administered 2015-05-08: 15 g via ORAL
  Filled 2015-05-08: qty 60

## 2015-05-08 MED ORDER — ASPIRIN EC 81 MG PO TBEC
81.0000 mg | DELAYED_RELEASE_TABLET | Freq: Every day | ORAL | Status: DC
  Administered 2015-05-08 – 2015-05-10 (×3): 81 mg via ORAL
  Filled 2015-05-08 (×3): qty 1

## 2015-05-08 MED ORDER — CYANOCOBALAMIN 500 MCG PO TABS
500.0000 ug | ORAL_TABLET | Freq: Every day | ORAL | Status: DC
  Administered 2015-05-08 – 2015-05-10 (×3): 500 ug via ORAL
  Filled 2015-05-08 (×4): qty 1

## 2015-05-08 MED ORDER — FERROUS SULFATE 325 (65 FE) MG PO TABS
325.0000 mg | ORAL_TABLET | Freq: Every day | ORAL | Status: DC
  Administered 2015-05-08 – 2015-05-10 (×3): 325 mg via ORAL
  Filled 2015-05-08 (×3): qty 1

## 2015-05-08 MED ORDER — VITAMIN C 500 MG PO TABS
500.0000 mg | ORAL_TABLET | Freq: Every day | ORAL | Status: DC
  Administered 2015-05-08 – 2015-05-10 (×3): 500 mg via ORAL
  Filled 2015-05-08 (×3): qty 1

## 2015-05-08 MED ORDER — INSULIN ASPART 100 UNIT/ML ~~LOC~~ SOLN
0.0000 [IU] | Freq: Three times a day (TID) | SUBCUTANEOUS | Status: DC
  Administered 2015-05-08 (×2): 1 [IU] via SUBCUTANEOUS
  Administered 2015-05-09: 13:00:00 2 [IU] via SUBCUTANEOUS
  Administered 2015-05-10: 13:00:00 1 [IU] via SUBCUTANEOUS
  Filled 2015-05-08: qty 2
  Filled 2015-05-08 (×3): qty 1

## 2015-05-08 MED ORDER — CHOLECALCIFEROL 10 MCG (400 UNIT) PO TABS
400.0000 [IU] | ORAL_TABLET | Freq: Every day | ORAL | Status: DC
  Administered 2015-05-08 – 2015-05-10 (×3): 400 [IU] via ORAL
  Filled 2015-05-08 (×3): qty 1

## 2015-05-08 MED ORDER — PANTOPRAZOLE SODIUM 40 MG PO TBEC
40.0000 mg | DELAYED_RELEASE_TABLET | Freq: Every day | ORAL | Status: DC
  Administered 2015-05-09 – 2015-05-10 (×2): 40 mg via ORAL
  Filled 2015-05-08 (×2): qty 1

## 2015-05-08 MED ORDER — METFORMIN HCL 500 MG PO TABS: 500.0000 mg | ORAL_TABLET | Freq: Two times a day (BID) | ORAL | Status: DC

## 2015-05-08 MED ORDER — MORPHINE SULFATE (PF) 2 MG/ML IV SOLN
1.0000 mg | INTRAVENOUS | Status: DC | PRN
Start: 2015-05-08 — End: 2015-05-11
  Administered 2015-05-08 – 2015-05-09 (×2): 1 mg via INTRAVENOUS
  Administered 2015-05-09 – 2015-05-10 (×3): 2 mg via INTRAVENOUS
  Filled 2015-05-08 (×5): qty 1

## 2015-05-08 MED ORDER — SODIUM POLYSTYRENE SULFONATE 15 GM/60ML PO SUSP
30.0000 g | Freq: Once | ORAL | Status: AC
  Administered 2015-05-08: 15:00:00 30 g via ORAL
  Filled 2015-05-08: qty 120

## 2015-05-08 MED ORDER — NITROGLYCERIN 0.4 MG SL SUBL: 0.4000 mg | SUBLINGUAL_TABLET | SUBLINGUAL | Status: DC | PRN

## 2015-05-08 MED ORDER — METOPROLOL SUCCINATE ER 100 MG PO TB24
100.0000 mg | ORAL_TABLET | Freq: Every day | ORAL | Status: DC
  Administered 2015-05-08 – 2015-05-10 (×3): 100 mg via ORAL
  Filled 2015-05-08 (×3): qty 1

## 2015-05-08 MED ORDER — VITAMIN B-12 500 MCG SL SUBL: 500.0000 ug | SUBLINGUAL_TABLET | Freq: Every day | SUBLINGUAL | Status: DC

## 2015-05-08 MED ORDER — ACETAMINOPHEN 650 MG RE SUPP: 650.0000 mg | Freq: Four times a day (QID) | RECTAL | Status: DC | PRN

## 2015-05-08 MED ORDER — SERTRALINE HCL 25 MG PO TABS
25.0000 mg | ORAL_TABLET | Freq: Every day | ORAL | Status: DC
  Administered 2015-05-08 – 2015-05-10 (×3): 25 mg via ORAL
  Filled 2015-05-08 (×3): qty 1

## 2015-05-08 MED ORDER — ONDANSETRON HCL 4 MG PO TABS: 4.0000 mg | ORAL_TABLET | Freq: Four times a day (QID) | ORAL | Status: DC | PRN

## 2015-05-08 MED ORDER — ATORVASTATIN CALCIUM 10 MG PO TABS
10.0000 mg | ORAL_TABLET | Freq: Every day | ORAL | Status: DC
  Administered 2015-05-08 – 2015-05-10 (×3): 10 mg via ORAL
  Filled 2015-05-08 (×3): qty 1

## 2015-05-08 MED ORDER — SODIUM CHLORIDE 0.9 % IJ SOLN
3.0000 mL | Freq: Two times a day (BID) | INTRAMUSCULAR | Status: DC
  Administered 2015-05-08 – 2015-05-10 (×4): 3 mL via INTRAVENOUS

## 2015-05-08 MED ORDER — DIGOXIN 125 MCG PO TABS
0.1250 mg | ORAL_TABLET | Freq: Every day | ORAL | Status: DC
  Administered 2015-05-08 – 2015-05-10 (×3): 0.125 mg via ORAL
  Filled 2015-05-08 (×3): qty 1

## 2015-05-08 MED ORDER — FENOFIBRATE 54 MG PO TABS
54.0000 mg | ORAL_TABLET | Freq: Every day | ORAL | Status: DC
  Administered 2015-05-08 – 2015-05-10 (×3): 54 mg via ORAL
  Filled 2015-05-08 (×3): qty 1

## 2015-05-08 MED ORDER — ONDANSETRON HCL 4 MG/2ML IJ SOLN
4.0000 mg | Freq: Four times a day (QID) | INTRAMUSCULAR | Status: DC | PRN
  Administered 2015-05-08 (×2): 4 mg via INTRAVENOUS
  Filled 2015-05-08 (×2): qty 2

## 2015-05-08 MED ORDER — ONDANSETRON HCL 4 MG/2ML IJ SOLN
4.0000 mg | INTRAMUSCULAR | Status: AC
  Administered 2015-05-08: 4 mg via INTRAVENOUS
  Filled 2015-05-08: qty 2

## 2015-05-08 NOTE — H&P (Signed)
[facility] [place] NAME: Regina Burke    MR#:  [id number]  DATE OF BIRTH:  01-01-1933  DATE OF ADMISSION:  05/08/2015  PRIMARY CARE PHYSICIAN: Tracie Harrier, MD   REQUESTING/REFERRING PHYSICIAN: Delman Kitten  CHIEF COMPLAINT:   Chief Complaint  Patient presents with  . Fall  . Head Injury    HISTORY OF PRESENT ILLNESS: Regina Burke  is a 81 y.o. female with a known history of  diabetes type 2, recurrent hyperkalemia, COPD, coronary artery disease, history of lung cancer who was recently hospitalized with dig toxicity. Who has been having weakness and unsteady gait ongoing for the past few days. She's had multiple falls over the past 3 days. She reports that she has some chronic dizziness and is worse. Earlier today she fell and hit the door. And has swelling around the right orbit. She was also noticed to have a nasal fracture and swelling of her lips due to her fall. The emergency room physician did speak to ENT who recommended monitoring patient. She otherwise denies any chest pain or shortness of breath she uses oxygen at all times.  PAST MEDICAL HISTORY:   Past Medical History  Diagnosis Date  . COPD (chronic obstructive pulmonary disease)   . CHF (congestive heart failure)   . Hypertension   . Rapid palpitations 2014    Seen at [facility], may have been atrial fibrillation  . DOE (dyspnea on exertion)     Started after treatment for lung cancer  . High cholesterol   . Heart murmur   . Anginal pain   . Myocardial infarction 12/22/2014     "& probably one ~ 1 wk ago & probably one in ~ 2004" (12/23/2014)  . Asthma   . Pneumonia     "several times in my lifetime" (12/23/2014)  . Lung cancer dx'd 2014    S/P radiation 2015  . Chronic bronchitis     "get it just about q yr" (12/23/2014)  . Sleep apnea     "couldn't use the mask" (12/23/2014)  . Cushing's disease   . Type II diabetes mellitus   . Anemia   . History of blood  transfusion     "related to when I was pregnant and a couple times since" (12/23/2014)  . GERD (gastroesophageal reflux disease)   . History of hiatal hernia   . Migraine     "maybe twice/yr" (12/23/2014)  . Daily headache   . Arthritis     "hands, knees, probably my back" (12/23/2014)  . Gout   . Depression   . Chronic kidney disease (CKD), stage III (moderate)   . Cancer of upper lobe of right lung 01/08/2015    PAST SURGICAL HISTORY:  Past Surgical History  Procedure Laterality Date  . Adrenalectomy Left 1980's    "Cushings"  . Abdominal hysterectomy    . Appendectomy    . Cholecystectomy    . Coronary angioplasty with stent placement  12/23/2014  . Tonsillectomy    . Fracture surgery    . Wrist fracture surgery Bilateral ~ 2000  . Breast cyst excision Left   . Tubal ligation    . Left heart catheterization with coronary angiogram N/A 12/23/2014    Procedure: LEFT HEART CATHETERIZATION WITH CORONARY ANGIOGRAM;  Surgeon: Burnell Blanks, MD;  Location: [facility] CATH LAB;  Service: Cardiovascular;  Laterality: N/A;  . Percutaneous coronary stent intervention (pci-s)  12/23/2014    Procedure: PERCUTANEOUS CORONARY STENT INTERVENTION (  PCI-S);  Surgeon: Burnell Blanks, MD;  Location: [facility] CATH LAB;  Service: Cardiovascular;;  Promus 2.25x8    SOCIAL HISTORY:  Social History  Substance Use Topics  . Smoking status: Former Smoker -- 1.00 packs/day for 45 years    Types: Cigarettes    Quit date: 04/25/1998  . Smokeless tobacco: Never Used  . Alcohol Use: Yes     Comment: 12/23/2014 "might have a couple mixed drinks/year"    FAMILY HISTORY:  Family History  Problem Relation Age of Onset  . Heart disease Mother   . Diabetes Mother   . Osteoarthritis Mother   . Hypertension Mother   . Heart disease Father   . Hypertension Father   . COPD Brother     DRUG ALLERGIES:  Allergies  Allergen Reactions  . Ciprofloxacin Shortness Of Breath, Itching and Rash  . Doxycycline  Shortness Of Breath, Itching and Rash  . Penicillins Shortness Of Breath, Itching and Rash  . Sulfa Antibiotics Shortness Of Breath, Itching and Rash  . Morphine And Related Itching    REVIEW OF SYSTEMS:   CONSTITUTIONAL: No fever, positive fatigue and weakness.  EYES: No blurred or double vision.  EARS, NOSE, AND THROAT: No tinnitus or ear pain. Has nasal swelling and lip swelling RESPIRATORY: No cough, shortness of breath, wheezing or hemoptysis.  CARDIOVASCULAR: No chest pain, orthopnea, edema.  GASTROINTESTINAL: No nausea, vomiting, diarrhea or abdominal pain.  GENITOURINARY: No dysuria, hematuria.  ENDOCRINE: No polyuria, nocturia,  HEMATOLOGY: No anemia, easy bruising or bleeding SKIN:  Has hematoma around her right eye MUSCULOSKELETAL: No joint pain or arthritis.   NEUROLOGIC: No tingling, numbness, weakness.  PSYCHIATRY: No anxiety or depression.   MEDICATIONS AT HOME:  Prior to Admission medications   Medication Sig Start Date End Date Taking? Authorizing Provider  clopidogrel (PLAVIX) 75 MG tablet Take 1 tablet (75 mg total) by mouth daily. 12/24/14  Yes Rhonda G Barrett, PA-C  vitamin C (ASCORBIC ACID) 500 MG tablet Take 500 mg by mouth daily.   Yes Historical Provider, MD  aspirin EC 81 MG tablet Take 1 tablet (81 mg total) by mouth daily. 04/13/15   Troy Sine, MD  atorvastatin (LIPITOR) 40 MG tablet Take 1 tablet (40 mg total) by mouth daily. 04/13/15   Troy Sine, MD  cholecalciferol (VITAMIN D) 400 UNITS TABS tablet Take 400 Units by mouth daily.    Historical Provider, MD  cyanocobalamin 500 MCG tablet Take 500 mcg by mouth daily.    Historical Provider, MD  diltiazem (DILACOR XR) 180 MG 24 hr capsule Take 180 mg by mouth daily.    Historical Provider, MD  febuxostat (ULORIC) 40 MG tablet Take 40 mg by mouth daily.     Historical Provider, MD  fenofibrate (TRICOR) 48 MG tablet Take 48 mg by mouth daily.    Historical Provider, MD  ferrous sulfate 325 (65 FE) MG  EC tablet Take 325 mg by mouth daily.     Historical Provider, MD  metFORMIN (GLUCOPHAGE) 500 MG tablet Take 500 mg by mouth 2 (two) times daily with a meal.    Historical Provider, MD  metoprolol succinate (TOPROL-XL) 100 MG 24 hr tablet Take 1 tablet (100 mg total) by mouth daily. Take with or immediately following a meal. 12/24/14   Evelene Croon Barrett, PA-C  Multiple Vitamins-Minerals (PRESERVISION AREDS 2) CAPS Take 1 capsule by mouth daily.    Historical Provider, MD  nitroGLYCERIN (NITROSTAT) 0.4 MG SL tablet Place 1 tablet (0.4  mg total) under the tongue every 5 (five) minutes as needed for chest pain. 12/24/14   Rhonda G Barrett, PA-C  sertraline (ZOLOFT) 25 MG tablet Take 25 mg by mouth daily.    Historical Provider, MD  valsartan (DIOVAN) 160 MG tablet Take 160 mg by mouth daily.    Historical Provider, MD      PHYSICAL EXAMINATION:   VITAL SIGNS: Blood pressure 156/58, pulse 72, temperature 98 F (36.7 C), temperature source Oral, resp. rate 17, weight 68.04 kg (150 lb), SpO2 91 %.  GENERAL:  81 y.o.-year-old patient lying in the bed with no acute distress.  EYES: Pupils equal, round, reactive to light and accommodation. No scleral icterus. Right eye is swollen HEENT: Head atraumatic, normocephalic. Oropharynx and nasopharynx clear. Lips are swollen NECK:  Supple, no jugular venous distention. No thyroid enlargement, no tenderness.  LUNGS: Normal breath sounds bilaterally, no wheezing, rales,rhonchi or crepitation. No use of accessory muscles of respiration.  CARDIOVASCULAR: S1, S2 normal. No murmurs, rubs, or gallops.  ABDOMEN: Soft, nontender, nondistended. Bowel sounds present. No organomegaly or mass.  EXTREMITIES: No pedal edema, cyanosis, or clubbing.  NEUROLOGIC: Cranial nerves II through XII are intact. Muscle strength 5/5 in all extremities. Sensation intact. Gait not checked.  PSYCHIATRIC: The patient is alert and oriented x 3.  SKIN: Bruising and swelling as  above  LABORATORY PANEL:   CBC  Recent Labs Lab 05/08/15 0838  WBC 11.9*  HGB 10.4*  HCT 31.9*  PLT 188  MCV 77.9*  MCH 25.5*  MCHC 32.7  RDW 16.5*   ------------------------------------------------------------------------------------------------------------------  Chemistries   Recent Labs Lab 05/08/15 0838  NA 137  K 5.7*  CL 108  CO2 24  GLUCOSE 165*  BUN 49*  CREATININE 1.37*  CALCIUM 9.0   ------------------------------------------------------------------------------------------------------------------ estimated creatinine clearance is 31.5 mL/min (by C-G formula based on Cr of 1.37). ------------------------------------------------------------------------------------------------------------------ No results for input(s): TSH, T4TOTAL, T3FREE, THYROIDAB in the last 72 hours.  Invalid input(s): FREET3   Coagulation profile No results for input(s): INR, PROTIME in the last 168 hours. ------------------------------------------------------------------------------------------------------------------- No results for input(s): DDIMER in the last 72 hours. -------------------------------------------------------------------------------------------------------------------  Cardiac Enzymes  Recent Labs Lab 05/08/15 0838  TROPONINI <0.03   ------------------------------------------------------------------------------------------------------------------ Invalid input(s): POCBNP  ---------------------------------------------------------------------------------------------------------------  Urinalysis    Component Value Date/Time   COLORURINE YELLOW* 05/08/2015 0838   APPEARANCEUR CLEAR* 05/08/2015 0838   LABSPEC 1.011 05/08/2015 0838   PHURINE 5.0 05/08/2015 0838   GLUCOSEU NEGATIVE 05/08/2015 0838   HGBUR NEGATIVE 05/08/2015 0838   BILIRUBINUR NEGATIVE 05/08/2015 0838   KETONESUR NEGATIVE 05/08/2015 0838   PROTEINUR NEGATIVE 05/08/2015 0838   NITRITE  NEGATIVE 05/08/2015 0838   LEUKOCYTESUR NEGATIVE 05/08/2015 0838     RADIOLOGY: Ct Head Wo Contrast  05/08/2015   CLINICAL DATA:  Tripped and fell, facial trauma, right periorbital hematoma and epistaxis. On Plavix and aspirin.  EXAM: CT HEAD WITHOUT CONTRAST  CT MAXILLOFACIAL WITHOUT CONTRAST  CT CERVICAL SPINE WITHOUT CONTRAST  TECHNIQUE: Multidetector CT imaging of the head, cervical spine, and maxillofacial structures were performed using the standard protocol without intravenous contrast. Multiplanar CT image reconstructions of the cervical spine and maxillofacial structures were also generated.  COMPARISON:  PET-CT 06/01/2014.  No recent similar comparison exam.  FINDINGS: CT HEAD FINDINGS  Findings referable to the face are described in detail below.  Diffuse cortical volume loss with proportional ventricular prominence. No acute hemorrhage, infarct, or mass lesion is identified. No midline shift. Right frontal scalp hematoma and presumed evidence of  previous right frontoparietal burr hole. No skull fracture involving the cranium.  CT MAXILLOFACIAL FINDINGS  Large right periorbital hematoma. Retrobulbar are fat is clean. Comminuted nasal bone fractures with associated gas and gas/fluid within the right nasopharynx. Paranasal sinuses are intact. Mandibular condyles are properly located. Globes are grossly unremarkable. Patient is partly edentulous. Zygomatic arches are intact.  CT CERVICAL SPINE FINDINGS  C1 through the cervicothoracic junction is visualized in its entirety. No precervical soft tissue widening. Mild loss of the normal cervical lordosis is noted centered at C5-C6. Minimal posterior spurring at this level is identified related to disc degenerative change. Vertebral body heights are maintained. No fracture or dislocation. Multilevel facet osteoarthritic change. Right upper lobe spiculated mass partly visualized, not further evaluated compared to previous diagnostic exam but subjectively  increased from 06/01/2014 exam.  IMPRESSION: Large right periorbital soft tissue hematoma with comminuted nasal bone fractures.  No acute intracranial abnormality.  No acute cervical spine abnormality.  Incomplete imaging of the lung apices demonstrates a partly imaged spiculated right upper lobe mass as seen on the prior exam and compatible with the history of lung cancer but not further specifically evaluated today, and could represent progression of disease although radiation fibrosis or other entities could appear similar. Outpatient nonemergent restaging is recommended.   Electronically Signed   By: Conchita Paris M.D.   On: 05/08/2015 09:59   Ct Cervical Spine Wo Contrast  05/08/2015   CLINICAL DATA:  Tripped and fell, facial trauma, right periorbital hematoma and epistaxis. On Plavix and aspirin.  EXAM: CT HEAD WITHOUT CONTRAST  CT MAXILLOFACIAL WITHOUT CONTRAST  CT CERVICAL SPINE WITHOUT CONTRAST  TECHNIQUE: Multidetector CT imaging of the head, cervical spine, and maxillofacial structures were performed using the standard protocol without intravenous contrast. Multiplanar CT image reconstructions of the cervical spine and maxillofacial structures were also generated.  COMPARISON:  PET-CT 06/01/2014.  No recent similar comparison exam.  FINDINGS: CT HEAD FINDINGS  Findings referable to the face are described in detail below.  Diffuse cortical volume loss with proportional ventricular prominence. No acute hemorrhage, infarct, or mass lesion is identified. No midline shift. Right frontal scalp hematoma and presumed evidence of previous right frontoparietal burr hole. No skull fracture involving the cranium.  CT MAXILLOFACIAL FINDINGS  Large right periorbital hematoma. Retrobulbar are fat is clean. Comminuted nasal bone fractures with associated gas and gas/fluid within the right nasopharynx. Paranasal sinuses are intact. Mandibular condyles are properly located. Globes are grossly unremarkable. Patient is  partly edentulous. Zygomatic arches are intact.  CT CERVICAL SPINE FINDINGS  C1 through the cervicothoracic junction is visualized in its entirety. No precervical soft tissue widening. Mild loss of the normal cervical lordosis is noted centered at C5-C6. Minimal posterior spurring at this level is identified related to disc degenerative change. Vertebral body heights are maintained. No fracture or dislocation. Multilevel facet osteoarthritic change. Right upper lobe spiculated mass partly visualized, not further evaluated compared to previous diagnostic exam but subjectively increased from 06/01/2014 exam.  IMPRESSION: Large right periorbital soft tissue hematoma with comminuted nasal bone fractures.  No acute intracranial abnormality.  No acute cervical spine abnormality.  Incomplete imaging of the lung apices demonstrates a partly imaged spiculated right upper lobe mass as seen on the prior exam and compatible with the history of lung cancer but not further specifically evaluated today, and could represent progression of disease although radiation fibrosis or other entities could appear similar. Outpatient nonemergent restaging is recommended.   Electronically Signed   By:  Conchita Paris M.D.   On: 05/08/2015 09:59   Ct Maxillofacial Wo Cm  05/08/2015   CLINICAL DATA:  Tripped and fell, facial trauma, right periorbital hematoma and epistaxis. On Plavix and aspirin.  EXAM: CT HEAD WITHOUT CONTRAST  CT MAXILLOFACIAL WITHOUT CONTRAST  CT CERVICAL SPINE WITHOUT CONTRAST  TECHNIQUE: Multidetector CT imaging of the head, cervical spine, and maxillofacial structures were performed using the standard protocol without intravenous contrast. Multiplanar CT image reconstructions of the cervical spine and maxillofacial structures were also generated.  COMPARISON:  PET-CT 06/01/2014.  No recent similar comparison exam.  FINDINGS: CT HEAD FINDINGS  Findings referable to the face are described in detail below.  Diffuse  cortical volume loss with proportional ventricular prominence. No acute hemorrhage, infarct, or mass lesion is identified. No midline shift. Right frontal scalp hematoma and presumed evidence of previous right frontoparietal burr hole. No skull fracture involving the cranium.  CT MAXILLOFACIAL FINDINGS  Large right periorbital hematoma. Retrobulbar are fat is clean. Comminuted nasal bone fractures with associated gas and gas/fluid within the right nasopharynx. Paranasal sinuses are intact. Mandibular condyles are properly located. Globes are grossly unremarkable. Patient is partly edentulous. Zygomatic arches are intact.  CT CERVICAL SPINE FINDINGS  C1 through the cervicothoracic junction is visualized in its entirety. No precervical soft tissue widening. Mild loss of the normal cervical lordosis is noted centered at C5-C6. Minimal posterior spurring at this level is identified related to disc degenerative change. Vertebral body heights are maintained. No fracture or dislocation. Multilevel facet osteoarthritic change. Right upper lobe spiculated mass partly visualized, not further evaluated compared to previous diagnostic exam but subjectively increased from 06/01/2014 exam.  IMPRESSION: Large right periorbital soft tissue hematoma with comminuted nasal bone fractures.  No acute intracranial abnormality.  No acute cervical spine abnormality.  Incomplete imaging of the lung apices demonstrates a partly imaged spiculated right upper lobe mass as seen on the prior exam and compatible with the history of lung cancer but not further specifically evaluated today, and could represent progression of disease although radiation fibrosis or other entities could appear similar. Outpatient nonemergent restaging is recommended.   Electronically Signed   By: Conchita Paris M.D.   On: 05/08/2015 09:59    EKG: Orders placed or performed during the hospital encounter of 05/08/15  . EKG 12-Lead  . EKG 12-Lead    IMPRESSION  AND PLAN: Patient is a 81 year old status post fall.  1. Status post fall due to unsteady gait and dizziness.: Supportive care PT evaluation and treatment patient may need further rehabilitation  2. Nasal fracture, periorbital bruising: Supportive care  3. Diabetes type 2: Place on sliding scale insulin  4. Hyperlipidemia continue home medication  5, hyperkalemia: Telemetry 1 dose Kayexalate hold her ARB  6. Misc: lovenox for dvt proph    All the records are reviewed and case discussed with ED provider. Management plans discussed with the patient, family and they are in agreement.  CODE STATUS:    Code Status Orders        Start     Ordered   05/08/15 1122  Full code   Continuous     05/08/15 1123       TOTAL TIME TAKING CARE OF THIS PATIENT: 43mnutes.    PDustin FlockM.D on 05/08/2015 at 11:38 AM  Between 7am to 6pm - Pager - [phone]  After 6pm go to [email or web address] - password [email or web address]  Office  [phone] CC: Primary care  physician; Tracie Harrier, MD

## 2015-05-08 NOTE — ED Provider Notes (Signed)
[facility] Emergency Department Provider Note REMINDER - THIS NOTE IS NOT A FINAL MEDICAL RECORD UNTIL IT IS SIGNED. UNTIL THEN, THE CONTENT BELOW MAY REFLECT INFORMATION FROM A DOCUMENTATION TEMPLATE, NOT THE ACTUAL PATIENT VISIT. ____________________________________________  Time seen: Approximately 9:41 AM  I have reviewed the triage vital signs and the nursing notes.   HISTORY  Chief Complaint Fall and Head Injury    HPI Regina Burke is a 81 y.o. female previous history of heart disease, congestive heart failure, COPD, recent digoxin toxicity who presents today after a fall. Patient's daughter reports that the patient fell twice yesterday to her knees feeling weak, and then this morning the patient does not recall well but believes that she fell into the side of a door and struck the right side of her face. She reports her nose was bleeding, and feels stopped up now. She does report that she has significant amount of swelling and she can't see from the right eye because it is swollen shut.  She denies any neck pain. No numbness or tingling. No chest pain or trouble breathing.  Patient reports she did not pass out. She thinks she tripped or became weak making her fall,  Denies other injury.   Past Medical History  Diagnosis Date  . COPD (chronic obstructive pulmonary disease)   . CHF (congestive heart failure)   . Hypertension   . Rapid palpitations 2014    Seen at [facility], may have been atrial fibrillation  . DOE (dyspnea on exertion)     Started after treatment for lung cancer  . High cholesterol   . Heart murmur   . Anginal pain   . Myocardial infarction 12/22/2014     "& probably one ~ 1 wk ago & probably one in ~ 2004" (12/23/2014)  . Asthma   . Pneumonia     "several times in my lifetime" (12/23/2014)  . Lung cancer dx'd 2014    S/P radiation 2015  . Chronic bronchitis     "get it just about q yr" (12/23/2014)  . Sleep apnea     "couldn't  use the mask" (12/23/2014)  . Cushing's disease   . Type II diabetes mellitus   . Anemia   . History of blood transfusion     "related to when I was pregnant and a couple times since" (12/23/2014)  . GERD (gastroesophageal reflux disease)   . History of hiatal hernia   . Migraine     "maybe twice/yr" (12/23/2014)  . Daily headache   . Arthritis     "hands, knees, probably my back" (12/23/2014)  . Gout   . Depression   . Chronic kidney disease (CKD), stage III (moderate)   . Cancer of upper lobe of right lung 01/08/2015    Patient Active Problem List   Diagnosis Date Noted  . Acute on chronic renal failure 04/15/2015  . Preoperative clearance 04/15/2015  . Digoxin toxicity 04/14/2015  . Hyperkalemia 04/14/2015  . CAD (coronary artery disease) 04/14/2015  . Cancer of upper lobe of right lung 01/08/2015  . Allergic state 01/08/2015  . Absolute anemia 01/08/2015  . A-fib 01/08/2015  . Carcinoma of lung 01/08/2015  . Chronic kidney disease 01/08/2015  . CAFL (chronic airflow limitation) 01/08/2015  . Arthritis, degenerative 01/08/2015  . 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 ACS (acute coronary syndrome) 12/23/2014  . Unstable angina 12/23/2014  . Diabetes mellitus without complication   . Hypertension   . Rapid palpitations   .  Diabetes mellitus, type 2 09/11/2014    Past Surgical History  Procedure Laterality Date  . Adrenalectomy Left 1980's    "Cushings"  . Abdominal hysterectomy    . Appendectomy    . Cholecystectomy    . Coronary angioplasty with stent placement  12/23/2014  . Tonsillectomy    . Fracture surgery    . Wrist fracture surgery Bilateral ~ 2000  . Breast cyst excision Left   . Tubal ligation    . Left heart catheterization with coronary angiogram N/A 12/23/2014    Procedure: LEFT HEART CATHETERIZATION WITH CORONARY ANGIOGRAM;  Surgeon: Burnell Blanks, MD;  Location: [facility] CATH LAB;  Service: Cardiovascular;  Laterality: N/A;  . Percutaneous  coronary stent intervention (pci-s)  12/23/2014    Procedure: PERCUTANEOUS CORONARY STENT INTERVENTION (PCI-S);  Surgeon: Burnell Blanks, MD;  Location: [facility] CATH LAB;  Service: Cardiovascular;;  Promus 2.25x8    Current Outpatient Rx  Name  Route  Sig  Dispense  Refill  . aspirin EC 81 MG tablet   Oral   Take 1 tablet (81 mg total) by mouth daily.   90 tablet   3   . atorvastatin (LIPITOR) 40 MG tablet   Oral   Take 1 tablet (40 mg total) by mouth daily.   90 tablet   3   . cholecalciferol (VITAMIN D) 400 UNITS TABS tablet   Oral   Take 400 Units by mouth daily.         . clopidogrel (PLAVIX) 75 MG tablet   Oral   Take 1 tablet (75 mg total) by mouth daily.   30 tablet   11   . cyanocobalamin 500 MCG tablet   Oral   Take 500 mcg by mouth daily.         Marland Kitchen diltiazem (DILACOR XR) 180 MG 24 hr capsule   Oral   Take 180 mg by mouth daily.         . febuxostat (ULORIC) 40 MG tablet   Oral   Take 40 mg by mouth daily.          . fenofibrate (TRICOR) 48 MG tablet   Oral   Take 48 mg by mouth daily.         . ferrous sulfate 325 (65 FE) MG EC tablet   Oral   Take 325 mg by mouth daily.          . metFORMIN (GLUCOPHAGE) 500 MG tablet   Oral   Take 500 mg by mouth 2 (two) times daily with a meal.         . metoprolol succinate (TOPROL-XL) 100 MG 24 hr tablet   Oral   Take 1 tablet (100 mg total) by mouth daily. Take with or immediately following a meal.   30 tablet   11   . Multiple Vitamins-Minerals (PRESERVISION AREDS 2) CAPS   Oral   Take 1 capsule by mouth daily.         . nitroGLYCERIN (NITROSTAT) 0.4 MG SL tablet   Sublingual   Place 1 tablet (0.4 mg total) under the tongue every 5 (five) minutes as needed for chest pain.   25 tablet   3   . sertraline (ZOLOFT) 25 MG tablet   Oral   Take 25 mg by mouth daily.         . valsartan (DIOVAN) 160 MG tablet   Oral   Take 160 mg by mouth daily.         Marland Kitchen  vitamin C (ASCORBIC  ACID) 500 MG tablet   Oral   Take 500 mg by mouth daily.           Allergies Ciprofloxacin; Doxycycline; Penicillins; Sulfa antibiotics; and Morphine and related  Family History  Problem Relation Age of Onset  . Heart disease Mother   . Diabetes Mother   . Osteoarthritis Mother   . Hypertension Mother   . Heart disease Father   . Hypertension Father   . COPD Brother     Social History Social History  Substance Use Topics  . Smoking status: Former Smoker -- 1.00 packs/day for 45 years    Types: Cigarettes    Quit date: 04/25/1998  . Smokeless tobacco: Never Used  . Alcohol Use: Yes     Comment: 12/23/2014 "might have a couple mixed drinks/year"    Review of Systems Constitutional: No fever/chills Eyes: No visual changes. ENT: No sore throat. Cardiovascular: Denies chest pain. Respiratory: Denies shortness of breath. Gastrointestinal: No abdominal pain.  No nausea, no vomiting.  No diarrhea.  No constipation. Genitourinary: Negative for dysuria. Musculoskeletal: Negative for back pain. Skin: Negative for rash. Neurological: Negative for headaches, focal weakness or numbness. Generalized weakness for the last 1 day.  10-point ROS otherwise negative.  ____________________________________________   PHYSICAL EXAM:  VITAL SIGNS: ED Triage Vitals  Enc Vitals Group     BP 05/08/15 0839 187/73 mmHg     Pulse Rate 05/08/15 0839 72     Resp 05/08/15 0839 18     Temp 05/08/15 0839 98 F (36.7 C)     Temp Source 05/08/15 0839 Oral     SpO2 05/08/15 0839 100 %     Weight 05/08/15 0839 150 lb (68.04 kg)     Height --      Head Cir --      Peak Flow --      Pain Score 05/08/15 0840 10     Pain Loc --      Pain Edu? --      Excl. in [place]? --    Constitutional: Alert and oriented. Well appearing and in no acute distress. Eyes: Conjunctivae are normal. PERRL. EOMI. Head: The patient has significant edema and hematoma overlying the right forehead with swelling into  the right eyelid. The right eyelid can be opened, and the conjunctiva and underlying eye appear normal. The patient reports that her vision in the right eye is normal, though she does have some blurring from cataracts and glaucoma. The left eye is normal. The remainder of the head is atraumatic. Nose: No congestion/rhinnorhea. Mouth/Throat: Mucous membranes are moist.  Oropharynx non-erythematous. Neck: No stridor.  No cervical spine tenderness. Good range of motion of the neck without pain. Cardiovascular: Normal rate, regular rhythm. Grossly normal heart sounds.  Good peripheral circulation. Respiratory: Normal respiratory effort.  No retractions. Lungs CTAB. Gastrointestinal: Soft and nontender. No distention. No abdominal bruits. No CVA tenderness. Musculoskeletal: No lower extremity tenderness nor edema.  No joint effusions. Neurologic:  Normal speech and language. No gross focal neurologic deficits are appreciated. Skin:  Skin is warm, dry and intact. No rash noted. Psychiatric: Mood and affect are normal. Speech and behavior are normal.  ____________________________________________   LABS (all labs ordered are listed, but only abnormal results are displayed)  Labs Reviewed  CBC - Abnormal; Notable for the following:    WBC 11.9 (*)    Hemoglobin 10.4 (*)    HCT 31.9 (*)    MCV 77.9 (*)  MCH 25.5 (*)    RDW 16.5 (*)    All other components within normal limits  URINALYSIS COMPLETEWITH MICROSCOPIC ([facility] ONLY) - Abnormal; Notable for the following:    Color, Urine YELLOW (*)    APPearance CLEAR (*)    Bacteria, UA RARE (*)    All other components within normal limits  BASIC METABOLIC PANEL - Abnormal; Notable for the following:    Potassium 5.7 (*)    Glucose, Bld 165 (*)    BUN 49 (*)    Creatinine, Ser 1.37 (*)    GFR calc non Af Amer 36 (*)    GFR calc Af Amer 41 (*)    All other components within normal limits  TROPONIN I  DIGOXIN LEVEL    ____________________________________________  EKG  EKG demonstrates normal sinus rhythm, there is a left bundle branch block, ventricular rate is 70, the patient does have slight peaking of the T waves in V3 and V4 which are more appreciable than on previous EKG in August. There is no evidence of acute ischemic change QTc 4:30 QRS 120 PR 170 Reviewed and interpreted by me at 8:40 AM ____________________________________________  RADIOLOGY  Large right periorbital soft tissue hematoma with comminuted nasal bone fractures.  No acute intracranial abnormality.  No acute cervical spine abnormality.  Incomplete imaging of the lung apices demonstrates a partly imaged spiculated right upper lobe mass as seen on the prior exam and compatible with the history of lung cancer but not further specifically evaluated today, and could represent progression of disease although radiation fibrosis or other entities could appear similar. Outpatient nonemergent restaging is recommended. ____________________________________________   PROCEDURES  Procedure(s) performed: None  Critical Care performed: No  ____________________________________________   INITIAL IMPRESSION / ASSESSMENT AND PLAN / ED COURSE  Pertinent labs & imaging results that were available during my care of the patient were reviewed by me and considered in my medical decision making (see chart for details).  Patient presents with a fall and obvious right-sided head injury. There is no evidence of retrobulbar hematoma or ocular injury, but the patient does have significant right frontal hematoma. Patient also has a nasal fracture.  Regarding the patient's fall, concerned that she has fallen twice yesterday and today fallen and hurt herself causing a nasal fracture and large hematoma on plavix. Her potassium is elevated at 5.7, and review of her EKG demonstrates a slight increase in T-wave peaking. Based on the EKG  abnormality and elevated potassium I have asked the hospitalist service to see the patient for admission. We will continue to observe her closely. No evidence of acute intracranial traumatic injury or bleeding. Given kayexalate and IV fluids in ER for hyperkalemia. The patient does still have some slight oozing into the posterior pharynx from her nasal fracture. On exam there is no septal hematoma, there is some posterior clot noted in the right and air. Of note, I did discuss with Dr. Pryor Ochoa of ENT who advised that the [facility] type packing could be placed, and I discussed with the patient and at this time since the bleeding is slowing and that the [facility] would likely cause significant discomfort we will continue to observe her. Should bleeding worsen then she may need to have a packing placed, Dr. Pryor Ochoa is available should he need to see the patient.  ____________________________________________   FINAL CLINICAL IMPRESSION(S) / ED DIAGNOSES  Final diagnoses:  Nasal fracture, closed, initial encounter  Hematoma of face, initial encounter  Hyperkalemia  EKG abnormalities  Fall, initial encounter      Delman Kitten, MD 05/08/15 1131

## 2015-05-08 NOTE — Plan of Care (Signed)
Problem: Discharge Progression Outcomes Goal: Other Discharge Outcomes/Goals Outcome: Progressing Plan of care progress to goal: Pt c/o pain in face from fall. She has fallen 3x in the last week. Morphine and percocet given prn, pt stated relief.  Zofran given PRN for nausea, pt stated relief. Little appetite today. Family at the bedside earlier today.

## 2015-05-08 NOTE — ED Notes (Signed)
Patient from home via [facility]. Patient tripped and fell into a door casing striking her face/head. Patient presents with a hematoma to the right eye, beeding from bilateral nostrils, and a hematoma to the lip. Patient vomited x1 with EMS. Patient has a recent history of "tripping" per family. Patient is on plavix and aspirin. Patient is A+Ox4

## 2015-05-08 NOTE — Evaluation (Signed)
Physical Therapy Evaluation Patient Details Name: Regina Burke MRN: [id number] DOB: [birth year] Today's Date: 05/08/2015   History of Present Illness  Pt has been having more and more recent falls, has signficant facial injury from most recent   Clinical Impression  Attempted to do some light in bed exercises on room air, o2 dropped to low 90s (was high 90s on 2 liters at rest and during ambulation).  She has some hesitancy with intially getting up, but was able to ambulate with and w/o AD and remains safe with both.  She has had numerous recent falls and PT encouraged her to slow and be more aware with her normal ambulation and activity.  Pt not interested in HHPT, will likely be fine with assist from her family.    Follow Up Recommendations No PT follow up (pt not interested, has assist from family)    Equipment Recommendations       Recommendations for Other Services       Precautions / Restrictions Precautions Precautions: Fall Restrictions Weight Bearing Restrictions: No      Mobility  Bed Mobility Overal bed mobility: Independent                Transfers Overall transfer level: Independent Equipment used: Rolling walker (2 wheeled)             General transfer comment: Pt is able to rise w/o direct assist, she does use the walker to maintain balance initially  Ambulation/Gait Ambulation/Gait assistance: Min guard Ambulation Distance (Feet): 100 Feet Assistive device: Rolling walker (2 wheeled) (50 ft with AD, 50 ft w/o, 2 liters o2 t/o ambulation)       General Gait Details: Pt is not reliant on the walker and actually has no LOBs or safety issues without AD.  She has some fatigue with the effort, but her o2 remains in the 90s (on 2 liters) and she reports feeling near her baseline with speed, cadence.   Stairs            Wheelchair Mobility    Modified [facility] (Stroke Patients Only)       Balance                                              Pertinent Vitals/Pain Pain Assessment:  (general facial soreness from fall)    Home Living Family/patient expects to be discharged to:: Private residence Living Arrangements: Children Available Help at Discharge: Family Type of Home: House Home Access: Stairs to enter   Technical brewer of Steps: 3          Prior Function Level of Independence: Independent with assistive device(s) (states she uses a cane outside the house)         Comments: Pt states she wants to get back to driving, family reports her vision is not appropriate for this.      Hand Dominance        Extremity/Trunk Assessment   Upper Extremity Assessment: Overall WFL for tasks assessed           Lower Extremity Assessment: Generalized weakness (L LE grossly 3+/5, R 4-/5)         Communication   Communication: No difficulties  Cognition Arousal/Alertness: Awake/alert Behavior During Therapy: WFL for tasks assessed/performed Overall Cognitive Status: Within Functional Limits for tasks assessed  General Comments      Exercises        Assessment/Plan    PT Assessment Patient needs continued PT services (here in the hospital)  PT Diagnosis Difficulty walking;Generalized weakness   PT Problem List Decreased strength;Decreased activity tolerance;Decreased balance;Decreased safety awareness;Decreased mobility  PT Treatment Interventions Gait training;Therapeutic activities;Therapeutic exercise;Balance training;Neuromuscular re-education;Stair training   PT Goals (Current goals can be found in the Care Plan section) Acute Rehab PT Goals Patient Stated Goal: "I just want to go home" PT Goal Formulation: With patient/family Time For Goal Achievement: 05/22/15 Potential to Achieve Goals: Good    Frequency Min 2X/week   Barriers to discharge        Co-evaluation               End of Session Equipment Utilized During  Treatment: Gait belt Activity Tolerance: Patient limited by fatigue;Patient tolerated treatment well Patient left: with bed alarm set           Time: 1422-1440 PT Time Calculation (min) (ACUTE ONLY): 18 min   Charges:   PT Evaluation $Initial PT Evaluation Tier I: 1 Procedure     PT G Codes:       Wayne Both, PT, DPT [phone]  Kreg Shropshire 05/08/2015, 4:58 PM

## 2015-05-08 NOTE — ED Notes (Signed)
Pt states she has vision trouble at baseline d/t macular degeneration and cataracts. Pt's daughter states pt has several walkers and a rolling walker but she does not use them at home.

## 2015-05-08 NOTE — Progress Notes (Addendum)
Notified MD about pt having aspirin, plavix and lovenox ordered. No changes at this time, okay to give all three medicines to patient, per MD. Asked if opthamologist consult was needed - MD stated no interventions at this time.

## 2015-05-09 ENCOUNTER — Encounter: Payer: Self-pay | Admitting: Internal Medicine

## 2015-05-09 DIAGNOSIS — E860 Dehydration: Secondary | ICD-10-CM | POA: Diagnosis present

## 2015-05-09 DIAGNOSIS — S022XXA Fracture of nasal bones, initial encounter for closed fracture: Secondary | ICD-10-CM | POA: Diagnosis present

## 2015-05-09 DIAGNOSIS — E119 Type 2 diabetes mellitus without complications: Secondary | ICD-10-CM

## 2015-05-09 LAB — CBC
HCT: 27.3 % — ABNORMAL LOW (ref 35.0–47.0)
Hemoglobin: 8.7 g/dL — ABNORMAL LOW (ref 12.0–16.0)
MCH: 25.3 pg — AB (ref 26.0–34.0)
MCHC: 31.9 g/dL — AB (ref 32.0–36.0)
MCV: 79.4 fL — ABNORMAL LOW (ref 80.0–100.0)
PLATELETS: 153 10*3/uL (ref 150–440)
RBC: 3.44 MIL/uL — ABNORMAL LOW (ref 3.80–5.20)
RDW: 16.3 % — AB (ref 11.5–14.5)
WBC: 7.8 10*3/uL (ref 3.6–11.0)

## 2015-05-09 LAB — BASIC METABOLIC PANEL
Anion gap: 7 (ref 5–15)
BUN: 34 mg/dL — AB (ref 6–20)
CALCIUM: 8.5 mg/dL — AB (ref 8.9–10.3)
CO2: 26 mmol/L (ref 22–32)
CREATININE: 1.25 mg/dL — AB (ref 0.44–1.00)
Chloride: 110 mmol/L (ref 101–111)
GFR calc Af Amer: 46 mL/min — ABNORMAL LOW (ref >60)
GFR calc non Af Amer: 40 mL/min — ABNORMAL LOW (ref >60)
GLUCOSE: 115 mg/dL — AB (ref 65–99)
Potassium: 4.4 mmol/L (ref 3.5–5.1)
Sodium: 143 mmol/L (ref 135–145)

## 2015-05-09 LAB — GLUCOSE, CAPILLARY
GLUCOSE-CAPILLARY: 96 mg/dL (ref 65–99)
Glucose-Capillary: 162 mg/dL — ABNORMAL HIGH (ref 65–99)
Glucose-Capillary: 74 mg/dL (ref 65–99)
Glucose-Capillary: 104 mg/dL — ABNORMAL HIGH (ref 65–99)

## 2015-05-09 NOTE — Progress Notes (Signed)
Patient ID: Regina Burke, female   DOB: 12/15/1932, 81 y.o.   MRN: [id number] SUBJECTIVE:  Admitted with recurrent falls, last causing nasal fracture and sig facial swelling; reports has fallen 3 times in last 1 wk.  This occurs at home when she's ambulating w/o a device; tends to lose her balance.  Labs with dehydration and hyperkalemia; had recent admission for dig toxicity and hyperkalemia. Had been on ARB; this was stopped at admission and BP up today  ______________________________________________________________________  ROS: Please see HPI; remainder of complete 10 point ROS is negative   Past Medical History  Diagnosis Date  . COPD (chronic obstructive pulmonary disease)   . CHF (congestive heart failure)   . Hypertension   . Rapid palpitations 2014    Seen at [facility], may have been atrial fibrillation  . DOE (dyspnea on exertion)     Started after treatment for lung cancer  . High cholesterol   . Heart murmur   . Myocardial infarction 12/22/2014   . Lung cancer dx'd 2014    S/P radiation 2015  . Sleep apnea   . Cushing's disease   . Type II diabetes mellitus   . Anemia   . GERD (gastroesophageal reflux disease)   . History of hiatal hernia   . Migraine   . Arthritis   . Gout   . Depression   . Chronic kidney disease (CKD), stage III (moderate)     Past Surgical History  Procedure Laterality Date  . Adrenalectomy Left 1980's    "Cushings"  . Abdominal hysterectomy    . Appendectomy    . Cholecystectomy    . Coronary angioplasty with stent placement  12/23/2014  . Tonsillectomy    . Fracture surgery    . Wrist fracture surgery Bilateral ~ 2000  . Breast cyst excision Left   . Tubal ligation    . Left heart catheterization with coronary angiogram N/A 12/23/2014    Procedure: LEFT HEART CATHETERIZATION WITH CORONARY ANGIOGRAM;  Surgeon: Burnell Blanks, MD;  Location: [facility] CATH LAB;  Service: Cardiovascular;  Laterality: N/A;  . Percutaneous coronary stent  intervention (pci-s)  12/23/2014    Procedure: PERCUTANEOUS CORONARY STENT INTERVENTION (PCI-S);  Surgeon: Burnell Blanks, MD;  Location: [facility] CATH LAB;  Service: Cardiovascular;;  Promus 2.25x8     Current facility-administered medications:  .  0.9 %  sodium chloride infusion, , Intravenous, Continuous, Dustin Flock, MD, Last Rate: 75 mL/hr at 05/09/15 0438 .  acetaminophen (TYLENOL) tablet 650 mg, 650 mg, Oral, Q6H PRN **OR** acetaminophen (TYLENOL) suppository 650 mg, 650 mg, Rectal, Q6H PRN, Dustin Flock, MD .  aspirin EC tablet 81 mg, 81 mg, Oral, Daily, Dustin Flock, MD, 81 mg at 05/08/15 1724 .  atorvastatin (LIPITOR) tablet 10 mg, 10 mg, Oral, Daily, Dustin Flock, MD, 10 mg at 05/08/15 1724 .  cholecalciferol (VITAMIN D) tablet 400 Units, 400 Units, Oral, Daily, Dustin Flock, MD, 400 Units at 05/08/15 1724 .  clopidogrel (PLAVIX) tablet 75 mg, 75 mg, Oral, Daily, Dustin Flock, MD, 75 mg at 05/08/15 1723 .  cyanocobalamin tablet 500 mcg, 500 mcg, Oral, Daily, Dustin Flock, MD, 500 mcg at 05/08/15 1723 .  digoxin (LANOXIN) tablet 0.125 mg, 0.125 mg, Oral, Daily, Dustin Flock, MD, 0.125 mg at 05/08/15 1724 .  diltiazem (DILACOR XR) 24 hr capsule 180 mg, 180 mg, Oral, Daily, Dustin Flock, MD, 180 mg at 05/08/15 1723 .  febuxostat (ULORIC) tablet 40 mg, 40 mg, Oral, Daily, Dustin Flock, MD, 40  mg at 05/08/15 1724 .  fenofibrate tablet 54 mg, 54 mg, Oral, Daily, Dustin Flock, MD, 54 mg at 05/08/15 1724 .  ferrous sulfate tablet 325 mg, 325 mg, Oral, Daily, Dustin Flock, MD, 325 mg at 05/08/15 1724 .  HYDROcodone-acetaminophen (NORCO/VICODIN) 5-325 MG per tablet 1 tablet, 1 tablet, Oral, Q6H PRN, Dustin Flock, MD, 1 tablet at 05/09/15 0952 .  insulin aspart (novoLOG) injection 0-9 Units, 0-9 Units, Subcutaneous, TID WC, Dustin Flock, MD, 1 Units at 05/08/15 1724 .  metoprolol succinate (TOPROL-XL) 24 hr tablet 100 mg, 100 mg, Oral, Daily, Dustin Flock, MD, 100  mg at 05/08/15 1724 .  morphine 2 MG/ML injection 1-2 mg, 1-2 mg, Intravenous, Q4H PRN, Dustin Flock, MD, 1 mg at 05/09/15 0310 .  multivitamin-lutein (OCUVITE-LUTEIN) capsule 1 capsule, 1 capsule, Oral, Daily, Dustin Flock, MD, 1 capsule at 05/08/15 1723 .  nitroGLYCERIN (NITROSTAT) SL tablet 0.4 mg, 0.4 mg, Sublingual, Q5 min PRN, Dustin Flock, MD .  ondansetron (ZOFRAN) tablet 4 mg, 4 mg, Oral, Q6H PRN **OR** ondansetron (ZOFRAN) injection 4 mg, 4 mg, Intravenous, Q6H PRN, Dustin Flock, MD, 4 mg at 05/08/15 2129 .  pantoprazole (PROTONIX) EC tablet 40 mg, 40 mg, Oral, Daily, Dustin Flock, MD, 40 mg at 05/09/15 0950 .  sertraline (ZOLOFT) tablet 25 mg, 25 mg, Oral, Daily, Dustin Flock, MD, 25 mg at 05/08/15 1724 .  sodium chloride 0.9 % injection 3 mL, 3 mL, Intravenous, Q12H, Dustin Flock, MD, 3 mL at 05/08/15 2139 .  vitamin C (ASCORBIC ACID) tablet 500 mg, 500 mg, Oral, Daily, Dustin Flock, MD, 500 mg at 05/08/15 1723  PHYSICAL EXAM:  BP 143/45 mmHg  Pulse 78  Temp(Src) 99.1 F (37.3 C) (Oral)  Resp 18  Ht $R'5\' 5"'WB$  (1.651 m)  Wt 68.04 kg (150 lb)  BMI 24.96 kg/m2  SpO2 100%  General: pleasant  female, in NAD HEENT: significant swelling/bruising over face; right eye swollen shut. Neck:  trachea midline, no thyromegaly Chest: normal to palpation Lungs: clear bilaterally without retractions or wheezes Cardiovascular: RRR; distal pulses 2+ Abdomen: soft, nontender, nondistended, positive bowel sounds Extremities: no clubbing, cyanosis, edema Neuro: alert and oriented, moves all extremities Derm: bruising as above; good skin turgor Lymph: no cervical or supraclavicular lymphadenopathy  Labs and imaging studies were reviewed  ASSESSMENT/PLAN:   1. Dehydration/hyperkalemia.  Cont IVF; holding ARB.  May have type 4 RTA. Watch met b 2. Falls- has been up with PT already and did fairly well, but clearly walking is impaired.  Must use assistive device at all times 3.  COPD- cont inhaled meds 4. H/o afib- following HR; in NSR currently.  Consider d/c'ing dig, given h/o toxicity 5. HTN- monitor BP with stopping ARB

## 2015-05-09 NOTE — Care Management Note (Addendum)
Case Management Note  Patient Details  Name: JERLYN PAIN MRN: [id number] Date of Birth: Apr 19, 1933  Subjective/Objective:       Ms Dromgoole was not discharged today per hyperkalemia, BP slightly elevated, and swelling around her eyes impairing her vision. Anticipate discharge within 1-2 days. Ms Brandle uses a cane at home, and otherwise relies on family support for home needs. Has fused suggestions about home health services thus far.    Action/Plan:   Expected Discharge Date:                  Expected Discharge Plan:     In-House Referral:     Discharge planning Services     Post Acute Care Choice:    Choice offered to:     DME Arranged:    DME Agency:     HH Arranged:    [place] Agency:     Status of Service:     Medicare Important Message Given:    Date Medicare IM Given:    Medicare IM give by:    Date Additional Medicare IM Given:    Additional Medicare Important Message give by:     If discussed at [place] of Stay Meetings, dates discussed:    Additional Comments:  Abednego Yeates A, RN 05/09/2015, 4:36 PM

## 2015-05-09 NOTE — Plan of Care (Signed)
Problem: Discharge Progression Outcomes Goal: Other Discharge Outcomes/Goals Outcome: Progressing Plan of Care Progress to Goal:   Pt has reported pain x2 during shift. BP is elevated. No other signs of distress noted. Will continue to monitor.

## 2015-05-09 NOTE — Clinical Social Work Note (Signed)
CSW consult placed due to patient requesting financial assist with meds. This would need to be followed up by the RN CM and CSW has notified Jeani Hawking, the RN CM today of this. PT assessed patient and she ambulated 100 ft and PT mentioned home health but patient refused. Please reconsult CSW if something changes. Shela Leff MSW,LCSW [phone]

## 2015-05-10 LAB — BASIC METABOLIC PANEL
Anion gap: 9 (ref 5–15)
BUN: 25 mg/dL — AB (ref 6–20)
CALCIUM: 8.2 mg/dL — AB (ref 8.9–10.3)
CO2: 23 mmol/L (ref 22–32)
CREATININE: 1.21 mg/dL — AB (ref 0.44–1.00)
Chloride: 108 mmol/L (ref 101–111)
GFR calc non Af Amer: 41 mL/min — ABNORMAL LOW (ref >60)
GFR, EST AFRICAN AMERICAN: 48 mL/min — AB (ref >60)
Glucose, Bld: 107 mg/dL — ABNORMAL HIGH (ref 65–99)
Potassium: 4.6 mmol/L (ref 3.5–5.1)
SODIUM: 140 mmol/L (ref 135–145)

## 2015-05-10 LAB — GLUCOSE, CAPILLARY
GLUCOSE-CAPILLARY: 112 mg/dL — AB (ref 65–99)
GLUCOSE-CAPILLARY: 138 mg/dL — AB (ref 65–99)
Glucose-Capillary: 136 mg/dL — ABNORMAL HIGH (ref 65–99)
Glucose-Capillary: 96 mg/dL (ref 65–99)

## 2015-05-10 LAB — CBC
HCT: 30.2 % — ABNORMAL LOW (ref 35.0–47.0)
Hemoglobin: 9.5 g/dL — ABNORMAL LOW (ref 12.0–16.0)
MCH: 25.5 pg — AB (ref 26.0–34.0)
MCHC: 31.4 g/dL — ABNORMAL LOW (ref 32.0–36.0)
MCV: 81.1 fL (ref 80.0–100.0)
PLATELETS: 165 10*3/uL (ref 150–440)
RBC: 3.73 MIL/uL — AB (ref 3.80–5.20)
RDW: 16.9 % — AB (ref 11.5–14.5)
WBC: 9.1 10*3/uL (ref 3.6–11.0)
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
## 2015-05-10 NOTE — Plan of Care (Signed)
Problem: Discharge Progression Outcomes Goal: Discharge plan in place and appropriate Individualization: Lives at home with her son and granddaughter Recently discharged in August from [facility]  Pt has hx of COPD, asthma, MI, migraines, depression, CKD, type II DM, anemia, GERD, cushing's disease and lung cancer controlled with home meds.  Outcome: Progressing Plan of Care Progress to Goal:  Plan is for pt to return home where she lives w/son.  Strongly encouraged her to use walker.  Goal: Pain controlled with appropriate interventions Outcome: Progressing Pt's pain is controlled w/PRN pain medication.  Hasn't required much pain medication.  Goal: Hemodynamically stable Outcome: Progressing VSS stable.  BP controlled w/medication.  Goal: Complications resolved/controlled Outcome: Progressing Pt to have Opthamology and ENT consult before she goes home. Goal: Tolerating diet Outcome: Progressing Tolerating diet Goal: Activity appropriate for discharge plan Outcome: Progressing Walked w/Pt to BR - she's steady.  Stand by assist only.

## 2015-05-10 NOTE — Consult Note (Signed)
Regina Burke, Regina Burke [id number] 05-Jul-1933 Tracie Harrier, MD  Reason for Consult: Nasal fracture secondary to trauma  HPI: Patient is a 81 year old white female who is had problems with weakness and unsteady gait recently. She fell last night and hit her face against door. She presented with epistaxis from her right nostril and bruising around her eyes. CT scans were done showed fracture the nose but no orbital fractures and no blood in the sinuses. She was admitted for observation last night. Consultation is placed to evaluate the nasal fracture and whether any surgical procedures needed to be done.  Allergies:  Allergies  Allergen Reactions  . Ciprofloxacin Shortness Of Breath, Itching and Rash  . Doxycycline Shortness Of Breath, Itching and Rash  . Penicillins Shortness Of Breath, Itching and Rash  . Sulfa Antibiotics Shortness Of Breath, Itching and Rash  . Morphine And Related Itching    ROS: Review of systems normal other than 12 systems except per HPI.  PMH:  Past Medical History  Diagnosis Date  . COPD (chronic obstructive pulmonary disease)   . CHF (congestive heart failure)   . Hypertension   . Rapid palpitations 2014    Seen at [facility], may have been atrial fibrillation  . DOE (dyspnea on exertion)     Started after treatment for lung cancer  . High cholesterol   . Heart murmur   . Myocardial infarction 12/22/2014   . Lung cancer dx'd 2014    S/P radiation 2015  . Sleep apnea   . Cushing's disease   . Type II diabetes mellitus   . Anemia   . GERD (gastroesophageal reflux disease)   . History of hiatal hernia   . Migraine   . Arthritis   . Gout   . Depression   . Chronic kidney disease (CKD), stage III (moderate)     FH:  Family History  Problem Relation Age of Onset  . Heart disease Mother   . Diabetes Mother   . Osteoarthritis Mother   . Hypertension Mother   . Heart disease Father   . Hypertension Father   . COPD Brother     SH:  Social History    Social History  . Marital Status: Widowed    Spouse Name: N/A  . Number of Children: N/A  . Years of Education: N/A   Occupational History  . Not on file.   Social History Main Topics  . Smoking status: Former Smoker -- 1.00 packs/day for 45 years    Types: Cigarettes    Quit date: 04/25/1998  . Smokeless tobacco: Never Used  . Alcohol Use: Yes     Comment: 12/23/2014 "might have a couple mixed drinks/year"  . Drug Use: No  . Sexual Activity: No   Other Topics Concern  . Not on file   Social History Narrative    PSH:  Past Surgical History  Procedure Laterality Date  . Adrenalectomy Left 1980's    "Cushings"  . Abdominal hysterectomy    . Appendectomy    . Cholecystectomy    . Coronary angioplasty with stent placement  12/23/2014  . Tonsillectomy    . Fracture surgery    . Wrist fracture surgery Bilateral ~ 2000  . Breast cyst excision Left   . Tubal ligation    . Left heart catheterization with coronary angiogram N/A 12/23/2014    Procedure: LEFT HEART CATHETERIZATION WITH CORONARY ANGIOGRAM;  Surgeon: Burnell Blanks, MD;  Location: [facility] CATH LAB;  Service: Cardiovascular;  Laterality: N/A;  .  Percutaneous coronary stent intervention (pci-s)  12/23/2014    Procedure: PERCUTANEOUS CORONARY STENT INTERVENTION (PCI-S);  Surgeon: Burnell Blanks, MD;  Location: [facility] CATH LAB;  Service: Cardiovascular;;  Promus 2.25x8    Physical  Exam: Well-developed white female who is alert and oriented. She has significant swelling of her right for head just above the brow. She has no swelling on her lower lids as well and was some swelling of the left upper lid. She has major swelling of her lower lip in the midline and bruising that extends down both sides of her chin. CN 2-12 grossly intact and symmetric. Oral cavity, gums, ororpharynx normal with no masses or lesions, she is edentulous. Skin warm and dry. Nasal cavity has old blood in it. No septal hematoma. External nose  is midline with no crepitus the bones. No displacement. Ears without masses or lesions. Neck supple with no masses or lesions. No lymphadenopathy palpated.  CT scan was reviewed which shows no real displacement of the nasal bones. The septum is fractured but not obstructing the nose. She has old blood and mucosal swelling in the nose. Her sinuses are completely clear and there is no evidence of other facial fractures.   A/P: Facial trauma leading to fracture of her septum and bleeding from the nose. Her nose is now clearing and she is breathing better through her nose and I do not think she will need any surgery to repair the septum. She will not need any surgery to the external nose or other facial bones. She may sniff to help clear her nose but should not blow her nose currently. She'll rest with her head elevated for the next couple days to make sure the swelling and bruising comes down. If she has any eye symptoms she can be seen by ophthalmology. If she has any problems with healing of her lip or nose then she will come to the office for evaluation, but if she is healing well she does not need a specific follow-up visit.   Kahliya Fraleigh H 05/10/2015 6:41 PM

## 2015-05-10 NOTE — Progress Notes (Signed)
Patient ID: Regina Burke, female   DOB: 01/01/1933, 81 y.o.   MRN: [id number] SUBJECTIVE:   81 y/o F Admitted with recurrent falls, last causing nasal fracture and sig facial swelling; reports has fallen 3 times in last 1 wk.  This am C/o Swelling and difficulty in opening Rt eye   ______________________________________________________________________  ROS: Please see HPI; remainder of complete 10 point ROS is negative   Past Medical History  Diagnosis Date  . COPD (chronic obstructive pulmonary disease)   . CHF (congestive heart failure)   . Hypertension   . Rapid palpitations 2014    Seen at [facility], may have been atrial fibrillation  . DOE (dyspnea on exertion)     Started after treatment for lung cancer  . High cholesterol   . Heart murmur   . Myocardial infarction 12/22/2014   . Lung cancer dx'd 2014    S/P radiation 2015  . Sleep apnea   . Cushing's disease   . Type II diabetes mellitus   . Anemia   . GERD (gastroesophageal reflux disease)   . History of hiatal hernia   . Migraine   . Arthritis   . Gout   . Depression   . Chronic kidney disease (CKD), stage III (moderate)     Past Surgical History  Procedure Laterality Date  . Adrenalectomy Left 1980's    "Cushings"  . Abdominal hysterectomy    . Appendectomy    . Cholecystectomy    . Coronary angioplasty with stent placement  12/23/2014  . Tonsillectomy    . Fracture surgery    . Wrist fracture surgery Bilateral ~ 2000  . Breast cyst excision Left   . Tubal ligation    . Left heart catheterization with coronary angiogram N/A 12/23/2014    Procedure: LEFT HEART CATHETERIZATION WITH CORONARY ANGIOGRAM;  Surgeon: Burnell Blanks, MD;  Location: [facility] CATH LAB;  Service: Cardiovascular;  Laterality: N/A;  . Percutaneous coronary stent intervention (pci-s)  12/23/2014    Procedure: PERCUTANEOUS CORONARY STENT INTERVENTION (PCI-S);  Surgeon: Burnell Blanks, MD;  Location[facility] CATH LAB;  Service:  Cardiovascular;;  Promus 2.25x8     Current facility-administered medications:  .  0.9 %  sodium chloride infusion, , Intravenous, Continuous, Dustin Flock, MD, Last Rate: 75 mL/hr at 05/10/15 0641 .  acetaminophen (TYLENOL) tablet 650 mg, 650 mg, Oral, Q6H PRN **OR** acetaminophen (TYLENOL) suppository 650 mg, 650 mg, Rectal, Q6H PRN, Dustin Flock, MD .  aspirin EC tablet 81 mg, 81 mg, Oral, Daily, Dustin Flock, MD, 81 mg at 05/09/15 1647 .  atorvastatin (LIPITOR) tablet 10 mg, 10 mg, Oral, Daily, Dustin Flock, MD, 10 mg at 05/09/15 1648 .  cholecalciferol (VITAMIN D) tablet 400 Units, 400 Units, Oral, Daily, Dustin Flock, MD, 400 Units at 05/09/15 1647 .  clopidogrel (PLAVIX) tablet 75 mg, 75 mg, Oral, Daily, Dustin Flock, MD, 75 mg at 05/09/15 1647 .  cyanocobalamin tablet 500 mcg, 500 mcg, Oral, Daily, Dustin Flock, MD, 500 mcg at 05/09/15 1648 .  digoxin (LANOXIN) tablet 0.125 mg, 0.125 mg, Oral, Daily, Dustin Flock, MD, 0.125 mg at 05/09/15 1648 .  diltiazem (DILACOR XR) 24 hr capsule 180 mg, 180 mg, Oral, Daily, Dustin Flock, MD, 180 mg at 05/09/15 1647 .  febuxostat (ULORIC) tablet 40 mg, 40 mg, Oral, Daily, Dustin Flock, MD, 40 mg at 05/09/15 1646 .  fenofibrate tablet 54 mg, 54 mg, Oral, Daily, Dustin Flock, MD, 54 mg at 05/09/15 1647 .  ferrous sulfate tablet  325 mg, 325 mg, Oral, Daily, Dustin Flock, MD, 325 mg at 05/09/15 1647 .  HYDROcodone-acetaminophen (NORCO/VICODIN) 5-325 MG per tablet 1 tablet, 1 tablet, Oral, Q6H PRN, Dustin Flock, MD, 1 tablet at 05/10/15 0546 .  insulin aspart (novoLOG) injection 0-9 Units, 0-9 Units, Subcutaneous, TID WC, Dustin Flock, MD, 2 Units at 05/09/15 1235 .  metoprolol succinate (TOPROL-XL) 24 hr tablet 100 mg, 100 mg, Oral, Daily, Dustin Flock, MD, 100 mg at 05/09/15 1647 .  morphine 2 MG/ML injection 1-2 mg, 1-2 mg, Intravenous, Q4H PRN, Dustin Flock, MD, 2 mg at 05/09/15 1359 .  multivitamin-lutein  (OCUVITE-LUTEIN) capsule 1 capsule, 1 capsule, Oral, Daily, Dustin Flock, MD, 1 capsule at 05/09/15 1648 .  nitroGLYCERIN (NITROSTAT) SL tablet 0.4 mg, 0.4 mg, Sublingual, Q5 min PRN, Dustin Flock, MD .  ondansetron (ZOFRAN) tablet 4 mg, 4 mg, Oral, Q6H PRN **OR** ondansetron (ZOFRAN) injection 4 mg, 4 mg, Intravenous, Q6H PRN, Dustin Flock, MD, 4 mg at 05/08/15 2129 .  pantoprazole (PROTONIX) EC tablet 40 mg, 40 mg, Oral, Daily, Dustin Flock, MD, 40 mg at 05/09/15 0950 .  sertraline (ZOLOFT) tablet 25 mg, 25 mg, Oral, Daily, Dustin Flock, MD, 25 mg at 05/09/15 1648 .  sodium chloride 0.9 % injection 3 mL, 3 mL, Intravenous, Q12H, Dustin Flock, MD, 3 mL at 05/09/15 2358 .  vitamin C (ASCORBIC ACID) tablet 500 mg, 500 mg, Oral, Daily, Dustin Flock, MD, 500 mg at 05/09/15 1647  PHYSICAL EXAM:  BP 131/43 mmHg  Pulse 71  Temp(Src) 99.3 F (37.4 C) (Oral)  Resp 18  Ht '5\' 5"'$  (1.651 m)  Wt 68.04 kg (150 lb)  BMI 24.96 kg/m2  SpO2 100%  General: pleasant  female, in NAD HEENT: significant swelling/bruising over face; right eye swollen shut. Neck:  trachea midline, no thyromegaly Chest: normal to palpation Lungs: clear bilaterally without retractions or wheezes Cardiovascular: RRR; distal pulses 2+ Abdomen: soft, nontender, nondistended, positive bowel sounds Extremities: no clubbing, cyanosis, edema Neuro: alert and oriented, moves all extremities Derm: bruising as above; good skin turgor Lymph: no cervical or supraclavicular lymphadenopathy  Labs and imaging studies were reviewed  ASSESSMENT/PLAN:   1. Dehydration/hyperkalemia.  Improving with hydration 2. Falls- has been up with PT already and did fairly well, but clearly walking is impaired.  Must use assistive device at all times CT: Large RT periorbital hematoma and nasal bone fractures 3. COPD- cont inhaled meds 4. H/o afib- following HR; in NSR currently.  Consider d/c'ing dig, given h/o toxicity 5. HTN-Off ARB  because of Hyperkalemia - Continue to monitor. 6 Rt eye swelling and ecchymosis- Consult Ophthalmologist

## 2015-05-10 NOTE — Plan of Care (Signed)
Problem: Discharge Progression Outcomes Goal: Other Discharge Outcomes/Goals Outcome: Progressing Plan of care progress to goal: Pt complains of a headache that is relieved with norco. VSS Complications- pt continues to have facial swelling and bruising Diet - heart healthy/carb modified Activity - pt gets up with one person assist

## 2015-05-11 LAB — BASIC METABOLIC PANEL
ANION GAP: 6 (ref 5–15)
BUN: 20 mg/dL (ref 6–20)
CALCIUM: 8.4 mg/dL — AB (ref 8.9–10.3)
CHLORIDE: 105 mmol/L (ref 101–111)
CO2: 29 mmol/L (ref 22–32)
Creatinine, Ser: 1.05 mg/dL — ABNORMAL HIGH (ref 0.44–1.00)
GFR calc Af Amer: 57 mL/min — ABNORMAL LOW (ref >60)
GFR calc non Af Amer: 49 mL/min — ABNORMAL LOW (ref >60)
GLUCOSE: 114 mg/dL — AB (ref 65–99)
Potassium: 4.2 mmol/L (ref 3.5–5.1)
Sodium: 140 mmol/L (ref 135–145)

## 2015-05-11 LAB — CBC WITH DIFFERENTIAL/PLATELET
BASOS ABS: 0 10*3/uL (ref 0–0.1)
Basophils Relative: 0 %
Eosinophils Absolute: 0.2 10*3/uL (ref 0–0.7)
Eosinophils Relative: 2 %
HCT: 26.9 % — ABNORMAL LOW (ref 35.0–47.0)
HEMOGLOBIN: 8.9 g/dL — AB (ref 12.0–16.0)
LYMPHS PCT: 9 %
Lymphs Abs: 0.7 10*3/uL — ABNORMAL LOW (ref 1.0–3.6)
MCH: 25.9 pg — ABNORMAL LOW (ref 26.0–34.0)
MCHC: 33 g/dL (ref 32.0–36.0)
MCV: 78.4 fL — AB (ref 80.0–100.0)
MONO ABS: 0.3 10*3/uL (ref 0.2–0.9)
MONOS PCT: 4 %
NEUTROS ABS: 7.4 10*3/uL — AB (ref 1.4–6.5)
NEUTROS PCT: 85 %
Platelets: 147 10*3/uL — ABNORMAL LOW (ref 150–440)
RBC: 3.43 MIL/uL — ABNORMAL LOW (ref 3.80–5.20)
RDW: 16.2 % — AB (ref 11.5–14.5)
WBC: 8.7 10*3/uL (ref 3.6–11.0)

## 2015-05-11 LAB — GLUCOSE, CAPILLARY: Glucose-Capillary: 96 mg/dL (ref 65–99)

## 2015-05-11 MED ORDER — INFLUENZA VAC SPLIT QUAD 0.5 ML IM SUSY
0.5000 mL | PREFILLED_SYRINGE | INTRAMUSCULAR | Status: DC
  Filled 2015-05-11: qty 0.5

## 2015-05-11 MED ORDER — INFLUENZA VAC SPLIT QUAD 0.5 ML IM SUSY
0.5000 mL | PREFILLED_SYRINGE | Freq: Once | INTRAMUSCULAR | Status: AC
  Administered 2015-05-11: 0.5 mL via INTRAMUSCULAR
  Filled 2015-05-11: qty 0.5

## 2015-05-11 NOTE — Discharge Instructions (Signed)
Facial Fracture A facial fracture is a break in one of the bones of your face. HOME CARE INSTRUCTIONS   Protect the injured part of your face until it is healed.  Do not participate in activities which give chance for re-injury until your doctor approves.  Gently wash and dry your face.  Wear head and facial protection while riding a bicycle, motorcycle, or snowmobile. SEEK MEDICAL CARE IF:   An oral temperature above 102 F (38.9 C) develops.  You have severe headaches or notice changes in your vision.  You have new numbness or tingling in your face.  You develop nausea (feeling sick to your stomach), vomiting or a stiff neck. SEEK IMMEDIATE MEDICAL CARE IF:   You develop difficulty seeing or experience double vision.  You become dizzy, lightheaded, or faint.  You develop trouble speaking, breathing, or swallowing.  You have a watery discharge from your nose or ear. MAKE SURE YOU:   Understand these instructions.  Will watch your condition.  Will get help right away if you are not doing well or get worse. Document Released: 08/14/2005 Document Revised: 11/06/2011 Document Reviewed: 04/02/2008 [facility] Patient Information 2015 [place], [place]. This information is not intended to replace advice given to you by your health care provider. Make sure you discuss any questions you have with your health care provider.  Fall Prevention and Home Safety Falls cause injuries and can affect all age groups. It is possible to use preventive measures to significantly decrease the likelihood of falls. There are many simple measures which can make your home safer and prevent falls. OUTDOORS  Repair cracks and edges of walkways and driveways.  Remove high doorway thresholds.  Trim shrubbery on the main path into your home.  Have good outside lighting.  Clear walkways of tools, rocks, debris, and clutter.  Check that handrails are not broken and are securely fastened. Both sides of  steps should have handrails.  Have leaves, snow, and ice cleared regularly.  Use sand or salt on walkways during winter months.  In the garage, clean up grease or oil spills. BATHROOM  Install night lights.  Install grab bars by the toilet and in the tub and shower.  Use non-skid mats or decals in the tub or shower.  Place a plastic non-slip stool in the shower to sit on, if needed.  Keep floors dry and clean up all water on the floor immediately.  Remove soap buildup in the tub or shower on a regular basis.  Secure bath mats with non-slip, double-sided rug tape.  Remove throw rugs and tripping hazards from the floors. BEDROOMS  Install night lights.  Make sure a bedside light is easy to reach.  Do not use oversized bedding.  Keep a telephone by your bedside.  Have a firm chair with side arms to use for getting dressed.  Remove throw rugs and tripping hazards from the floor. KITCHEN  Keep handles on pots and pans turned toward the center of the stove. Use back burners when possible.  Clean up spills quickly and allow time for drying.  Avoid walking on wet floors.  Avoid hot utensils and knives.  Position shelves so they are not too high or low.  Place commonly used objects within easy reach.  If necessary, use a sturdy step stool with a grab bar when reaching.  Keep electrical cables out of the way.  Do not use floor polish or wax that makes floors slippery. If you must use wax, use non-skid  floor wax.  Remove throw rugs and tripping hazards from the floor. STAIRWAYS  Never leave objects on stairs.  Place handrails on both sides of stairways and use them. Fix any loose handrails. Make sure handrails on both sides of the stairways are as long as the stairs.  Check carpeting to make sure it is firmly attached along stairs. Make repairs to worn or loose carpet promptly.  Avoid placing throw rugs at the top or bottom of stairways, or properly secure the  rug with carpet tape to prevent slippage. Get rid of throw rugs, if possible.  Have an electrician put in a light switch at the top and bottom of the stairs. OTHER FALL PREVENTION TIPS  Wear low-heel or rubber-soled shoes that are supportive and fit well. Wear closed toe shoes.  When using a stepladder, make sure it is fully opened and both spreaders are firmly locked. Do not climb a closed stepladder.  Add color or contrast paint or tape to grab bars and handrails in your home. Place contrasting color strips on first and last steps.  Learn and use mobility aids as needed. Install an electrical emergency response system.  Turn on lights to avoid dark areas. Replace light bulbs that burn out immediately. Get light switches that glow.  Arrange furniture to create clear pathways. Keep furniture in the same place.  Firmly attach carpet with non-skid or double-sided tape.  Eliminate uneven floor surfaces.  Select a carpet pattern that does not visually hide the edge of steps.  Be aware of all pets. OTHER HOME SAFETY TIPS  Set the water temperature for 120 F (48.8 C).  Keep emergency numbers on or near the telephone.  Keep smoke detectors on every level of the home and near sleeping areas. Document Released: 08/04/2002 Document Revised: 02/13/2012 Document Reviewed: 11/03/2011 [facility] Patient Information 2015 [place], [place]. This information is not intended to replace advice given to you by your health care provider. Make sure you discuss any questions you have with your health care provider.

## 2015-05-11 NOTE — Discharge Summary (Signed)
Physician Discharge Summary  Regina Burke DPO:[id number] DOB: [birth year] DOA: 05/08/2015  PCP: Tracie Harrier, MD  Admit date: 05/08/2015 Discharge date: 05/11/2015  Time spent: 35 minutes  Recommendations for Outpatient Follow-up:  D/c Home today. Call office to make appt in 1-2 weeks   Discharge Diagnoses:  1 Fall with facial injury and  Closed nasal bone fracture and Rt periorbital hematoma 2 Type 2 DM 3 Chronic a-fib 4 Hyperkalemia 5 Dehydration and weakenss 6 CKD 7 Dehydration 8 Anemia     Discharge Condition: Stable  Diet recommendation: 1800 cals  Filed Weights   05/08/15 0839 05/08/15 1243  Weight: 68.04 kg (150 lb) 68.04 kg (150 lb)    History of present illness:  Regina Burke is a 81 year old female with a history of type 2 diabetes, COPD hyperkalemia history of lung CA,Chronic A. Fib, was recently hospitalized with digitoxicity-presented to the ED after she became dizzy and fell and hit the door and sustained significant bruising to her face and also sustained a nasal bone fracture with swelling of the lower lip and hematoma formation the right.,,  Hospital Course:  Patient was admitted [facility] was held because of hyperkalemia . She was seen in consultation by ENT specialist Dr.Juengel who did not think surgery was necessary to repair the septum he also felt that she would not need surgery to the external os of the facial bones . He did recommend that she could sniff to keep clear her nose but should not blow her nose currently , I also discussed the case with ophthalmologist recommended that she be evaluated as an outpatient . During stay in hospital -patient was also seen by physical therapy . She received intravenous fluids. Right eye which was closed on admission gradually started to open up. Her vision was intact Blood pressure remained stable and her renal function improved with serum creatinine 1.05 on day of  discharge his serum potassium 4.2. Patient has been advised to follow me Dr. Ginette Pitman  in 1-2 weeks' time and advised call the office with any questions or concerns      Consultations: Dr. Kathyrn Sheriff- ENT Ophthalmology as out pt   Discharge Exam: Filed Vitals:   05/11/15 0456  BP: 151/78  Pulse: 94  Temp: 98.7 F (37.1 C)  Resp: 18    General: Facial ecchymosis and Rt periorbital hematoma noted  Cardiovascular: irregular Respiratory: Clear to auscultation  Discharge Instructions   Discharge Instructions    Diet - low sodium heart healthy    Complete by:  As directed      Discharge instructions    Complete by:  As directed   Follow up with Ophthalmologist as out pt D/c Home today. Call office to make appt in 1 week. Call with any questions or concerns          Discharge Medication List as of 05/11/2015  8:48 AM    CONTINUE these medications which have NOT CHANGED   Details  aspirin EC 81 MG tablet Take 1 tablet (81 mg total) by mouth daily., Starting 04/13/2015, Until Discontinued, OTC    atorvastatin (LIPITOR) 10 MG tablet Take 10 mg by mouth daily., Starting 05/05/2015, Until Discontinued, Historical Med    cholecalciferol (VITAMIN D) 400 UNITS TABS tablet Take 400 Units by mouth daily., Until Discontinued, Historical Med    clopidogrel (PLAVIX) 75 MG tablet Take 1 tablet (75 mg total) by mouth daily., Starting 12/24/2014, Until Discontinued, Normal    Cyanocobalamin (VITAMIN B-12) 500 MCG  SUBL Place 500 mcg under the tongue daily., Until Discontinued, Historical Med    digoxin (DIGITEK) 0.125 MG tablet Take 0.125 mg by mouth daily., Until Discontinued, Historical Med    diltiazem (DILACOR XR) 180 MG 24 hr capsule Take 180 mg by mouth daily., Until Discontinued, Historical Med    febuxostat (ULORIC) 40 MG tablet Take 40 mg by mouth daily. , Until Discontinued, Historical Med    fenofibrate (TRICOR) 48 MG tablet Take 48 mg by mouth daily., Until Discontinued,  Historical Med    ferrous sulfate 325 (65 FE) MG EC tablet Take 325 mg by mouth daily. , Until Discontinued, Historical Med    metFORMIN (GLUCOPHAGE) 500 MG tablet Take 500 mg by mouth 2 (two) times daily with a meal., Until Discontinued, Historical Med    metoprolol succinate (TOPROL-XL) 100 MG 24 hr tablet Take 1 tablet (100 mg total) by mouth daily. Take with or immediately following a meal., Starting 12/24/2014, Until Discontinued, Normal    Multiple Vitamins-Minerals (PRESERVISION AREDS 2) CAPS Take 1 capsule by mouth daily., Until Discontinued, Historical Med    nitroGLYCERIN (NITROSTAT) 0.4 MG SL tablet Place 1 tablet (0.4 mg total) under the tongue every 5 (five) minutes as needed for chest pain., Starting 12/24/2014, Until Discontinued, Normal    pantoprazole (PROTONIX) 40 MG tablet Take 40 mg by mouth daily., Until Discontinued, Historical Med    sertraline (ZOLOFT) 25 MG tablet Take 25 mg by mouth daily., Until Discontinued, Historical Med    vitamin C (ASCORBIC ACID) 500 MG tablet Take 500 mg by mouth daily., Until Discontinued, Historical Med      STOP taking these medications     valsartan (DIOVAN) 160 MG tablet      cyanocobalamin 500 MCG tablet        Allergies  Allergen Reactions  . Ciprofloxacin Shortness Of Breath, Itching and Rash  . Doxycycline Shortness Of Breath, Itching and Rash  . Penicillins Shortness Of Breath, Itching and Rash  . Sulfa Antibiotics Shortness Of Breath, Itching and Rash  . Morphine And Related Itching      The results of significant diagnostics from this hospitalization (including imaging, microbiology, ancillary and laboratory) are listed below for reference.    Significant Diagnostic Studies: Ct Head Wo Contrast  05/08/2015   CLINICAL DATA:  Tripped and fell, facial trauma, right periorbital hematoma and epistaxis. On Plavix and aspirin.  EXAM: CT HEAD WITHOUT CONTRAST  CT MAXILLOFACIAL WITHOUT CONTRAST  CT CERVICAL SPINE WITHOUT  CONTRAST  TECHNIQUE: Multidetector CT imaging of the head, cervical spine, and maxillofacial structures were performed using the standard protocol without intravenous contrast. Multiplanar CT image reconstructions of the cervical spine and maxillofacial structures were also generated.  COMPARISON:  PET-CT 06/01/2014.  No recent similar comparison exam.  FINDINGS: CT HEAD FINDINGS  Findings referable to the face are described in detail below.  Diffuse cortical volume loss with proportional ventricular prominence. No acute hemorrhage, infarct, or mass lesion is identified. No midline shift. Right frontal scalp hematoma and presumed evidence of previous right frontoparietal burr hole. No skull fracture involving the cranium.  CT MAXILLOFACIAL FINDINGS  Large right periorbital hematoma. Retrobulbar are fat is clean. Comminuted nasal bone fractures with associated gas and gas/fluid within the right nasopharynx. Paranasal sinuses are intact. Mandibular condyles are properly located. Globes are grossly unremarkable. Patient is partly edentulous. Zygomatic arches are intact.  CT CERVICAL SPINE FINDINGS  C1 through the cervicothoracic junction is visualized in its entirety. No precervical soft tissue widening.  Mild loss of the normal cervical lordosis is noted centered at C5-C6. Minimal posterior spurring at this level is identified related to disc degenerative change. Vertebral body heights are maintained. No fracture or dislocation. Multilevel facet osteoarthritic change. Right upper lobe spiculated mass partly visualized, not further evaluated compared to previous diagnostic exam but subjectively increased from 06/01/2014 exam.  IMPRESSION: Large right periorbital soft tissue hematoma with comminuted nasal bone fractures.  No acute intracranial abnormality.  No acute cervical spine abnormality.  Incomplete imaging of the lung apices demonstrates a partly imaged spiculated right upper lobe mass as seen on the prior exam  and compatible with the history of lung cancer but not further specifically evaluated today, and could represent progression of disease although radiation fibrosis or other entities could appear similar. Outpatient nonemergent restaging is recommended.   Electronically Signed   By: Conchita Paris M.D.   On: 05/08/2015 09:59   Ct Cervical Spine Wo Contrast  05/08/2015   CLINICAL DATA:  Tripped and fell, facial trauma, right periorbital hematoma and epistaxis. On Plavix and aspirin.  EXAM: CT HEAD WITHOUT CONTRAST  CT MAXILLOFACIAL WITHOUT CONTRAST  CT CERVICAL SPINE WITHOUT CONTRAST  TECHNIQUE: Multidetector CT imaging of the head, cervical spine, and maxillofacial structures were performed using the standard protocol without intravenous contrast. Multiplanar CT image reconstructions of the cervical spine and maxillofacial structures were also generated.  COMPARISON:  PET-CT 06/01/2014.  No recent similar comparison exam.  FINDINGS: CT HEAD FINDINGS  Findings referable to the face are described in detail below.  Diffuse cortical volume loss with proportional ventricular prominence. No acute hemorrhage, infarct, or mass lesion is identified. No midline shift. Right frontal scalp hematoma and presumed evidence of previous right frontoparietal burr hole. No skull fracture involving the cranium.  CT MAXILLOFACIAL FINDINGS  Large right periorbital hematoma. Retrobulbar are fat is clean. Comminuted nasal bone fractures with associated gas and gas/fluid within the right nasopharynx. Paranasal sinuses are intact. Mandibular condyles are properly located. Globes are grossly unremarkable. Patient is partly edentulous. Zygomatic arches are intact.  CT CERVICAL SPINE FINDINGS  C1 through the cervicothoracic junction is visualized in its entirety. No precervical soft tissue widening. Mild loss of the normal cervical lordosis is noted centered at C5-C6. Minimal posterior spurring at this level is identified related to disc  degenerative change. Vertebral body heights are maintained. No fracture or dislocation. Multilevel facet osteoarthritic change. Right upper lobe spiculated mass partly visualized, not further evaluated compared to previous diagnostic exam but subjectively increased from 06/01/2014 exam.  IMPRESSION: Large right periorbital soft tissue hematoma with comminuted nasal bone fractures.  No acute intracranial abnormality.  No acute cervical spine abnormality.  Incomplete imaging of the lung apices demonstrates a partly imaged spiculated right upper lobe mass as seen on the prior exam and compatible with the history of lung cancer but not further specifically evaluated today, and could represent progression of disease although radiation fibrosis or other entities could appear similar. Outpatient nonemergent restaging is recommended.   Electronically Signed   By: Conchita Paris M.D.   On: 05/08/2015 09:59   Ct Maxillofacial Wo Cm  05/08/2015   CLINICAL DATA:  Tripped and fell, facial trauma, right periorbital hematoma and epistaxis. On Plavix and aspirin.  EXAM: CT HEAD WITHOUT CONTRAST  CT MAXILLOFACIAL WITHOUT CONTRAST  CT CERVICAL SPINE WITHOUT CONTRAST  TECHNIQUE: Multidetector CT imaging of the head, cervical spine, and maxillofacial structures were performed using the standard protocol without intravenous contrast. Multiplanar CT image reconstructions of  the cervical spine and maxillofacial structures were also generated.  COMPARISON:  PET-CT 06/01/2014.  No recent similar comparison exam.  FINDINGS: CT HEAD FINDINGS  Findings referable to the face are described in detail below.  Diffuse cortical volume loss with proportional ventricular prominence. No acute hemorrhage, infarct, or mass lesion is identified. No midline shift. Right frontal scalp hematoma and presumed evidence of previous right frontoparietal burr hole. No skull fracture involving the cranium.  CT MAXILLOFACIAL FINDINGS  Large right periorbital  hematoma. Retrobulbar are fat is clean. Comminuted nasal bone fractures with associated gas and gas/fluid within the right nasopharynx. Paranasal sinuses are intact. Mandibular condyles are properly located. Globes are grossly unremarkable. Patient is partly edentulous. Zygomatic arches are intact.  CT CERVICAL SPINE FINDINGS  C1 through the cervicothoracic junction is visualized in its entirety. No precervical soft tissue widening. Mild loss of the normal cervical lordosis is noted centered at C5-C6. Minimal posterior spurring at this level is identified related to disc degenerative change. Vertebral body heights are maintained. No fracture or dislocation. Multilevel facet osteoarthritic change. Right upper lobe spiculated mass partly visualized, not further evaluated compared to previous diagnostic exam but subjectively increased from 06/01/2014 exam.  IMPRESSION: Large right periorbital soft tissue hematoma with comminuted nasal bone fractures.  No acute intracranial abnormality.  No acute cervical spine abnormality.  Incomplete imaging of the lung apices demonstrates a partly imaged spiculated right upper lobe mass as seen on the prior exam and compatible with the history of lung cancer but not further specifically evaluated today, and could represent progression of disease although radiation fibrosis or other entities could appear similar. Outpatient nonemergent restaging is recommended.   Electronically Signed   By: Conchita Paris M.D.   On: 05/08/2015 09:59    Microbiology: No results found for this or any previous visit (from the past 240 hour(s)).   Labs: Basic Metabolic Panel:  Recent Labs Lab 05/08/15 0838 05/09/15 0503 05/10/15 0443 05/11/15 0445  NA 137 143 140 140  K 5.7* 4.4 4.6 4.2  CL 108 110 108 105  CO2 '24 26 23 29  '$ GLUCOSE 165* 115* 107* 114*  BUN 49* 34* 25* 20  CREATININE 1.37* 1.25* 1.21* 1.05*  CALCIUM 9.0 8.5* 8.2* 8.4*   Liver Function Tests: No results for  input(s): AST, ALT, ALKPHOS, BILITOT, PROT, ALBUMIN in the last 168 hours. No results for input(s): LIPASE, AMYLASE in the last 168 hours. No results for input(s): AMMONIA in the last 168 hours. CBC:  Recent Labs Lab 05/08/15 0838 05/09/15 0503 05/10/15 0443 05/11/15 0445  WBC 11.9* 7.8 9.1 8.7  NEUTROABS  --   --   --  7.4*  HGB 10.4* 8.7* 9.5* 8.9*  HCT 31.9* 27.3* 30.2* 26.9*  MCV 77.9* 79.4* 81.1 78.4*  PLT 188 153 165 147*   Cardiac Enzymes:  Recent Labs Lab 05/08/15 0838  TROPONINI <0.03   BNP: BNP (last 3 results)  Recent Labs  12/23/14 0434  BNP 213.8*    ProBNP (last 3 results) No results for input(s): PROBNP in the last 8760 hours.  CBG:  Recent Labs Lab 05/10/15 0722 05/10/15 1123 05/10/15 1608 05/10/15 2111 05/11/15 0716  GLUCAP 112* 136* 96 138* 96       Signed:  Nobuo Nunziata   05/11/2015, 1:15 PM

## 2015-05-11 NOTE — Progress Notes (Signed)
Patient ID: Regina Burke, female   DOB: 1933/03/07, 81 y.o.   MRN: [id number] SUBJECTIVE:   81 y/o F Admitted with recurrent falls, last causing nasal fracture and sig facial swelling; reports has fallen 3 times in last 1 wk.  Pt feels better. Able to open Rt eye    ______________________________________________________________________  ROS: Please see HPI; remainder of complete 10 point ROS is negative   Past Medical History  Diagnosis Date  . COPD (chronic obstructive pulmonary disease)   . CHF (congestive heart failure)   . Hypertension   . Rapid palpitations 2014    Seen at [address], may have been atrial fibrillation  . DOE (dyspnea on exertion)     Started after treatment for lung cancer  . High cholesterol   . Heart murmur   . Myocardial infarction 12/22/2014   . Lung cancer dx'd 2014    S/P radiation 2015  . Sleep apnea   . Cushing's disease   . Type II diabetes mellitus   . Anemia   . GERD (gastroesophageal reflux disease)   . History of hiatal hernia   . Migraine   . Arthritis   . Gout   . Depression   . Chronic kidney disease (CKD), stage III (moderate)     Past Surgical History  Procedure Laterality Date  . Adrenalectomy Left 1980's    "Cushings"  . Abdominal hysterectomy    . Appendectomy    . Cholecystectomy    . Coronary angioplasty with stent placement  12/23/2014  . Tonsillectomy    . Fracture surgery    . Wrist fracture surgery Bilateral ~ 2000  . Breast cyst excision Left   . Tubal ligation    . Left heart catheterization with coronary angiogram N/A 12/23/2014    Procedure: LEFT HEART CATHETERIZATION WITH CORONARY ANGIOGRAM;  Surgeon: Burnell Blanks, MD;  Location: [facility] CATH LAB;  Service: Cardiovascular;  Laterality: N/A;  . Percutaneous coronary stent intervention (pci-s)  12/23/2014    Procedure: PERCUTANEOUS CORONARY STENT INTERVENTION (PCI-S);  Surgeon: Burnell Blanks, MD;  Location: [facility] CATH LAB;  Service: Cardiovascular;;  Promus  2.25x8     Current facility-administered medications:  .  0.9 %  sodium chloride infusion, , Intravenous, Continuous, Dustin Flock, MD, Last Rate: 75 mL/hr at 05/10/15 1918 .  acetaminophen (TYLENOL) tablet 650 mg, 650 mg, Oral, Q6H PRN **OR** acetaminophen (TYLENOL) suppository 650 mg, 650 mg, Rectal, Q6H PRN, Dustin Flock, MD .  aspirin EC tablet 81 mg, 81 mg, Oral, Daily, Dustin Flock, MD, 81 mg at 05/10/15 1711 .  atorvastatin (LIPITOR) tablet 10 mg, 10 mg, Oral, Daily, Dustin Flock, MD, 10 mg at 05/10/15 1711 .  cholecalciferol (VITAMIN D) tablet 400 Units, 400 Units, Oral, Daily, Dustin Flock, MD, 400 Units at 05/10/15 1711 .  clopidogrel (PLAVIX) tablet 75 mg, 75 mg, Oral, Daily, Dustin Flock, MD, 75 mg at 05/10/15 1711 .  cyanocobalamin tablet 500 mcg, 500 mcg, Oral, Daily, Dustin Flock, MD, 500 mcg at 05/10/15 1712 .  digoxin (LANOXIN) tablet 0.125 mg, 0.125 mg, Oral, Daily, Dustin Flock, MD, 0.125 mg at 05/10/15 1711 .  diltiazem (DILACOR XR) 24 hr capsule 180 mg, 180 mg, Oral, Daily, Dustin Flock, MD, 180 mg at 05/10/15 1712 .  febuxostat (ULORIC) tablet 40 mg, 40 mg, Oral, Daily, Dustin Flock, MD, 40 mg at 05/10/15 1711 .  fenofibrate tablet 54 mg, 54 mg, Oral, Daily, Dustin Flock, MD, 54 mg at 05/10/15 1711 .  ferrous sulfate tablet 325  mg, 325 mg, Oral, Daily, Dustin Flock, MD, 325 mg at 05/10/15 1717 .  HYDROcodone-acetaminophen (NORCO/VICODIN) 5-325 MG per tablet 1 tablet, 1 tablet, Oral, Q6H PRN, Dustin Flock, MD, 1 tablet at 05/10/15 0546 .  [START ON 05/12/2015] Influenza vac split quadrivalent PF (FLUARIX) injection 0.5 mL, 0.5 mL, Intramuscular, Tomorrow-1000, Arayla Kruschke, MD .  insulin aspart (novoLOG) injection 0-9 Units, 0-9 Units, Subcutaneous, TID WC, Dustin Flock, MD, 1 Units at 05/10/15 1232 .  metoprolol succinate (TOPROL-XL) 24 hr tablet 100 mg, 100 mg, Oral, Daily, Dustin Flock, MD, 100 mg at 05/10/15 1712 .  morphine 2 MG/ML  injection 1-2 mg, 1-2 mg, Intravenous, Q4H PRN, Dustin Flock, MD, 2 mg at 05/10/15 1918 .  multivitamin-lutein (OCUVITE-LUTEIN) capsule 1 capsule, 1 capsule, Oral, Daily, Dustin Flock, MD, 1 capsule at 05/10/15 1711 .  nitroGLYCERIN (NITROSTAT) SL tablet 0.4 mg, 0.4 mg, Sublingual, Q5 min PRN, Dustin Flock, MD .  ondansetron (ZOFRAN) tablet 4 mg, 4 mg, Oral, Q6H PRN **OR** ondansetron (ZOFRAN) injection 4 mg, 4 mg, Intravenous, Q6H PRN, Dustin Flock, MD, 4 mg at 05/08/15 2129 .  pantoprazole (PROTONIX) EC tablet 40 mg, 40 mg, Oral, Daily, Dustin Flock, MD, 40 mg at 05/10/15 1017 .  sertraline (ZOLOFT) tablet 25 mg, 25 mg, Oral, Daily, Dustin Flock, MD, 25 mg at 05/10/15 1712 .  sodium chloride 0.9 % injection 3 mL, 3 mL, Intravenous, Q12H, Dustin Flock, MD, 3 mL at 05/10/15 2230 .  vitamin C (ASCORBIC ACID) tablet 500 mg, 500 mg, Oral, Daily, Dustin Flock, MD, 500 mg at 05/10/15 1711  PHYSICAL EXAM:  BP 151/78 mmHg  Pulse 94  Temp(Src) 98.7 F (37.1 C) (Oral)  Resp 18  Ht '5\' 5"'$  (1.651 m)  Wt 68.04 kg (150 lb)  BMI 24.96 kg/m2  SpO2 93%  General: pleasant  female, in NAD HEENT: significant swelling/bruising over face; right eye periorbital swelling + Ecchymosis of chin and lower lip noted  Neck:  trachea midline, no thyromegaly Chest: normal to palpation Lungs: clear bilaterally without retractions or wheezes Cardiovascular: RRR; distal pulses 2+ Abdomen: soft, nontender, nondistended, positive bowel sounds Extremities: no clubbing, cyanosis, edema Neuro: alert and oriented, moves all extremities Derm: bruising as above; good skin turgor Lymph: no cervical or supraclavicular lymphadenopathy  Labs and imaging studies were reviewed  ASSESSMENT/PLAN:   1. Dehydration/hyperkalemia.  Improving with hydration 2. Falls- has been up with PT already and did fairly well, but clearly walking is impaired.  Must use assistive device at all times CT: Large RT periorbital  hematoma and nasal bone fractures 3. COPD- cont inhaled meds 4. H/o afib- following HR; in NSR currently.  Consider d/c'ing dig, given h/o toxicity 5. HTN-Off ARB because of Hyperkalemia - Continue to monitor. 6 Rt eye swelling and ecchymosis- Can see Ophthalmologist as out pt 7 Nasal fracture- Seen by Dr. Kathyrn Sheriff- No surgery needed D/c home today

## 2015-05-11 NOTE — Plan of Care (Addendum)
Problem: Discharge Progression Outcomes Goal: Other Discharge Outcomes/Goals Outcome: Completed/Met Date Met:  05/11/15 Patient had no c/o pain today  VSS, swelling around eye and facial swelling improved  Patient is tolerating diet well  Received MD order to discharge patient to home, medications , discharge instructions reviewed with patient and patient verbalized understanding discharged in wheelchair with auxillary to home

## 2015-05-11 NOTE — Plan of Care (Signed)
Problem: Discharge Progression Outcomes Goal: Other Discharge Outcomes/Goals Outcome: Progressing Plan of care progress to goal: Occasional severe headache is managed with morphine. VSS.   pts eyes, lips and forehead are still swollen.  Some improvement noted over 2 days. Pt tolerating diet. Activity - pt up as needed with one assist

## 2015-05-15 ENCOUNTER — Inpatient Hospital Stay
Admission: EM | Admit: 2015-05-15 | Discharge: 2015-05-24 | DRG: 208 | Disposition: A | Discharge status: Skilled Nursing Facility | Payer: Medicare Other | Attending: Internal Medicine | Admitting: Internal Medicine

## 2015-05-15 ENCOUNTER — Encounter: Payer: Self-pay | Admitting: Intensive Care

## 2015-05-15 ENCOUNTER — Emergency Department: Payer: Medicare Other

## 2015-05-15 ENCOUNTER — Other Ambulatory Visit: Payer: Self-pay

## 2015-05-15 DIAGNOSIS — J9622 Acute and chronic respiratory failure with hypercapnia: Principal | ICD-10-CM | POA: Diagnosis present

## 2015-05-15 DIAGNOSIS — Z7982 Long term (current) use of aspirin: Secondary | ICD-10-CM | POA: Diagnosis not present

## 2015-05-15 DIAGNOSIS — I252 Old myocardial infarction: Secondary | ICD-10-CM | POA: Diagnosis not present

## 2015-05-15 DIAGNOSIS — J9621 Acute and chronic respiratory failure with hypoxia: Secondary | ICD-10-CM | POA: Diagnosis present

## 2015-05-15 DIAGNOSIS — I480 Paroxysmal atrial fibrillation: Secondary | ICD-10-CM | POA: Diagnosis present

## 2015-05-15 DIAGNOSIS — F419 Anxiety disorder, unspecified: Secondary | ICD-10-CM | POA: Diagnosis present

## 2015-05-15 DIAGNOSIS — Z9049 Acquired absence of other specified parts of digestive tract: Secondary | ICD-10-CM | POA: Diagnosis present

## 2015-05-15 DIAGNOSIS — E780 Pure hypercholesterolemia: Secondary | ICD-10-CM | POA: Diagnosis present

## 2015-05-15 DIAGNOSIS — E119 Type 2 diabetes mellitus without complications: Secondary | ICD-10-CM | POA: Diagnosis present

## 2015-05-15 DIAGNOSIS — Z9981 Dependence on supplemental oxygen: Secondary | ICD-10-CM | POA: Diagnosis not present

## 2015-05-15 DIAGNOSIS — M109 Gout, unspecified: Secondary | ICD-10-CM | POA: Diagnosis present

## 2015-05-15 DIAGNOSIS — J9690 Respiratory failure, unspecified, unspecified whether with hypoxia or hypercapnia: Secondary | ICD-10-CM

## 2015-05-15 DIAGNOSIS — J441 Chronic obstructive pulmonary disease with (acute) exacerbation: Secondary | ICD-10-CM | POA: Diagnosis present

## 2015-05-15 DIAGNOSIS — J9600 Acute respiratory failure, unspecified whether with hypoxia or hypercapnia: Secondary | ICD-10-CM | POA: Diagnosis not present

## 2015-05-15 DIAGNOSIS — Z90710 Acquired absence of both cervix and uterus: Secondary | ICD-10-CM

## 2015-05-15 DIAGNOSIS — C3490 Malignant neoplasm of unspecified part of unspecified bronchus or lung: Secondary | ICD-10-CM | POA: Diagnosis present

## 2015-05-15 DIAGNOSIS — S0993XA Unspecified injury of face, initial encounter: Secondary | ICD-10-CM | POA: Diagnosis present

## 2015-05-15 DIAGNOSIS — Z87891 Personal history of nicotine dependence: Secondary | ICD-10-CM

## 2015-05-15 DIAGNOSIS — K219 Gastro-esophageal reflux disease without esophagitis: Secondary | ICD-10-CM | POA: Diagnosis present

## 2015-05-15 DIAGNOSIS — Z9851 Tubal ligation status: Secondary | ICD-10-CM

## 2015-05-15 DIAGNOSIS — E875 Hyperkalemia: Secondary | ICD-10-CM | POA: Diagnosis present

## 2015-05-15 DIAGNOSIS — G4730 Sleep apnea, unspecified: Secondary | ICD-10-CM | POA: Diagnosis present

## 2015-05-15 DIAGNOSIS — W010XXA Fall on same level from slipping, tripping and stumbling without subsequent striking against object, initial encounter: Secondary | ICD-10-CM | POA: Diagnosis present

## 2015-05-15 DIAGNOSIS — J189 Pneumonia, unspecified organism: Secondary | ICD-10-CM | POA: Diagnosis present

## 2015-05-15 DIAGNOSIS — M1990 Unspecified osteoarthritis, unspecified site: Secondary | ICD-10-CM | POA: Diagnosis present

## 2015-05-15 DIAGNOSIS — I5032 Chronic diastolic (congestive) heart failure: Secondary | ICD-10-CM | POA: Diagnosis present

## 2015-05-15 DIAGNOSIS — Z888 Allergy status to other drugs, medicaments and biological substances status: Secondary | ICD-10-CM | POA: Diagnosis not present

## 2015-05-15 DIAGNOSIS — R011 Cardiac murmur, unspecified: Secondary | ICD-10-CM | POA: Diagnosis present

## 2015-05-15 DIAGNOSIS — Z79899 Other long term (current) drug therapy: Secondary | ICD-10-CM | POA: Diagnosis not present

## 2015-05-15 DIAGNOSIS — Z825 Family history of asthma and other chronic lower respiratory diseases: Secondary | ICD-10-CM

## 2015-05-15 DIAGNOSIS — Z8261 Family history of arthritis: Secondary | ICD-10-CM | POA: Diagnosis not present

## 2015-05-15 DIAGNOSIS — R0600 Dyspnea, unspecified: Secondary | ICD-10-CM

## 2015-05-15 DIAGNOSIS — G9340 Encephalopathy, unspecified: Secondary | ICD-10-CM | POA: Diagnosis present

## 2015-05-15 DIAGNOSIS — Z923 Personal history of irradiation: Secondary | ICD-10-CM

## 2015-05-15 DIAGNOSIS — Z833 Family history of diabetes mellitus: Secondary | ICD-10-CM | POA: Diagnosis not present

## 2015-05-15 DIAGNOSIS — Z9889 Other specified postprocedural states: Secondary | ICD-10-CM | POA: Diagnosis not present

## 2015-05-15 DIAGNOSIS — Z85118 Personal history of other malignant neoplasm of bronchus and lung: Secondary | ICD-10-CM | POA: Diagnosis not present

## 2015-05-15 DIAGNOSIS — I130 Hypertensive heart and chronic kidney disease with heart failure and stage 1 through stage 4 chronic kidney disease, or unspecified chronic kidney disease: Secondary | ICD-10-CM | POA: Diagnosis present

## 2015-05-15 DIAGNOSIS — Z955 Presence of coronary angioplasty implant and graft: Secondary | ICD-10-CM | POA: Diagnosis not present

## 2015-05-15 DIAGNOSIS — Z880 Allergy status to penicillin: Secondary | ICD-10-CM

## 2015-05-15 DIAGNOSIS — I4891 Unspecified atrial fibrillation: Secondary | ICD-10-CM

## 2015-05-15 DIAGNOSIS — Z8249 Family history of ischemic heart disease and other diseases of the circulatory system: Secondary | ICD-10-CM | POA: Diagnosis not present

## 2015-05-15 DIAGNOSIS — Z882 Allergy status to sulfonamides status: Secondary | ICD-10-CM | POA: Diagnosis not present

## 2015-05-15 DIAGNOSIS — I481 Persistent atrial fibrillation: Secondary | ICD-10-CM | POA: Diagnosis not present

## 2015-05-15 DIAGNOSIS — N183 Chronic kidney disease, stage 3 (moderate): Secondary | ICD-10-CM | POA: Diagnosis present

## 2015-05-15 DIAGNOSIS — I2510 Atherosclerotic heart disease of native coronary artery without angina pectoris: Secondary | ICD-10-CM | POA: Diagnosis present

## 2015-05-15 DIAGNOSIS — Z4659 Encounter for fitting and adjustment of other gastrointestinal appliance and device: Secondary | ICD-10-CM

## 2015-05-15 DIAGNOSIS — Z978 Presence of other specified devices: Secondary | ICD-10-CM

## 2015-05-15 LAB — CBC WITH DIFFERENTIAL/PLATELET
BASOS ABS: 0.1 10*3/uL (ref 0–0.1)
BASOS PCT: 1 %
EOS ABS: 0.3 10*3/uL (ref 0–0.7)
Eosinophils Relative: 3 %
HCT: 29.6 % — ABNORMAL LOW (ref 35.0–47.0)
HEMOGLOBIN: 9.8 g/dL — AB (ref 12.0–16.0)
Lymphocytes Relative: 10 %
Lymphs Abs: 0.8 10*3/uL — ABNORMAL LOW (ref 1.0–3.6)
MCH: 26.2 pg (ref 26.0–34.0)
MCHC: 33.2 g/dL (ref 32.0–36.0)
MCV: 78.8 fL — ABNORMAL LOW (ref 80.0–100.0)
Monocytes Absolute: 0.3 10*3/uL (ref 0.2–0.9)
Monocytes Relative: 4 %
NEUTROS PCT: 82 %
Neutro Abs: 6.8 10*3/uL — ABNORMAL HIGH (ref 1.4–6.5)
Platelets: 215 10*3/uL (ref 150–440)
RBC: 3.76 MIL/uL — AB (ref 3.80–5.20)
RDW: 16.4 % — ABNORMAL HIGH (ref 11.5–14.5)
WBC: 8.3 10*3/uL (ref 3.6–11.0)

## 2015-05-15 LAB — BASIC METABOLIC PANEL
ANION GAP: 8 (ref 5–15)
BUN: 34 mg/dL — ABNORMAL HIGH (ref 6–20)
CHLORIDE: 103 mmol/L (ref 101–111)
CO2: 29 mmol/L (ref 22–32)
CREATININE: 1.13 mg/dL — AB (ref 0.44–1.00)
Calcium: 9 mg/dL (ref 8.9–10.3)
GFR calc non Af Amer: 45 mL/min — ABNORMAL LOW (ref >60)
GFR, EST AFRICAN AMERICAN: 52 mL/min — AB (ref >60)
Glucose, Bld: 119 mg/dL — ABNORMAL HIGH (ref 65–99)
POTASSIUM: 5.4 mmol/L — AB (ref 3.5–5.1)
SODIUM: 140 mmol/L (ref 135–145)

## 2015-05-15 LAB — BRAIN NATRIURETIC PEPTIDE: B NATRIURETIC PEPTIDE 5: 560 pg/mL — AB (ref 0.0–100.0)

## 2015-05-15 LAB — TROPONIN I

## 2015-05-15 LAB — MRSA PCR SCREENING: MRSA by PCR: NEGATIVE

## 2015-05-15 MED ORDER — VITAMIN C 500 MG PO TABS
500.0000 mg | ORAL_TABLET | Freq: Every day | ORAL | Status: DC
  Administered 2015-05-15 – 2015-05-24 (×10): 500 mg via ORAL
  Filled 2015-05-15 (×10): qty 1

## 2015-05-15 MED ORDER — IPRATROPIUM-ALBUTEROL 0.5-2.5 (3) MG/3ML IN SOLN
3.0000 mL | RESPIRATORY_TRACT | Status: DC
  Administered 2015-05-15 – 2015-05-16 (×3): 3 mL via RESPIRATORY_TRACT
  Filled 2015-05-15 (×3): qty 3

## 2015-05-15 MED ORDER — CHOLECALCIFEROL 10 MCG (400 UNIT) PO TABS
400.0000 [IU] | ORAL_TABLET | Freq: Every day | ORAL | Status: DC
  Administered 2015-05-15 – 2015-05-24 (×10): 400 [IU] via ORAL
  Filled 2015-05-15 (×9): qty 1

## 2015-05-15 MED ORDER — AZITHROMYCIN 250 MG PO TABS
500.0000 mg | ORAL_TABLET | Freq: Every day | ORAL | Status: AC
  Administered 2015-05-15: 500 mg via ORAL
  Filled 2015-05-15: qty 2

## 2015-05-15 MED ORDER — FENOFIBRATE 54 MG PO TABS
54.0000 mg | ORAL_TABLET | Freq: Every day | ORAL | Status: DC
  Administered 2015-05-15 – 2015-05-24 (×10): 54 mg via ORAL
  Filled 2015-05-15 (×12): qty 1

## 2015-05-15 MED ORDER — DILTIAZEM HCL 25 MG/5ML IV SOLN
10.0000 mg | Freq: Once | INTRAVENOUS | Status: AC
  Administered 2015-05-15: 10 mg via INTRAVENOUS

## 2015-05-15 MED ORDER — SENNA 8.6 MG PO TABS
1.0000 | ORAL_TABLET | Freq: Every day | ORAL | Status: DC | PRN
  Administered 2015-05-23: 8.6 mg via ORAL
  Filled 2015-05-15 (×2): qty 1

## 2015-05-15 MED ORDER — ENOXAPARIN SODIUM 40 MG/0.4ML ~~LOC~~ SOLN
40.0000 mg | SUBCUTANEOUS | Status: DC
  Administered 2015-05-15: 40 mg via SUBCUTANEOUS
  Filled 2015-05-15 (×2): qty 0.4

## 2015-05-15 MED ORDER — FEBUXOSTAT 40 MG PO TABS
40.0000 mg | ORAL_TABLET | Freq: Every day | ORAL | Status: DC
  Administered 2015-05-15 – 2015-05-24 (×10): 40 mg via ORAL
  Filled 2015-05-15 (×11): qty 1

## 2015-05-15 MED ORDER — DILTIAZEM HCL ER 180 MG PO CP24
180.0000 mg | ORAL_CAPSULE | Freq: Every day | ORAL | Status: DC
  Administered 2015-05-15 – 2015-05-16 (×2): 180 mg via ORAL
  Filled 2015-05-15 (×3): qty 1

## 2015-05-15 MED ORDER — DIGOXIN 125 MCG PO TABS
0.1250 mg | ORAL_TABLET | Freq: Every day | ORAL | Status: DC
  Administered 2015-05-15 – 2015-05-16 (×2): 0.125 mg via ORAL
  Filled 2015-05-15 (×2): qty 1

## 2015-05-15 MED ORDER — AZITHROMYCIN 250 MG PO TABS
250.0000 mg | ORAL_TABLET | Freq: Every day | ORAL | Status: AC
  Administered 2015-05-16 – 2015-05-19 (×4): 250 mg via ORAL
  Filled 2015-05-15 (×4): qty 1

## 2015-05-15 MED ORDER — DILTIAZEM HCL 100 MG IV SOLR
5.0000 mg/h | INTRAVENOUS | Status: DC
  Administered 2015-05-15: 5 mg/h via INTRAVENOUS
  Administered 2015-05-16 (×2): 12.5 mg/h via INTRAVENOUS
  Filled 2015-05-15 (×3): qty 100

## 2015-05-15 MED ORDER — DILTIAZEM HCL 25 MG/5ML IV SOLN
INTRAVENOUS | Status: AC
  Administered 2015-05-15: 10 mg via INTRAVENOUS
  Filled 2015-05-15: qty 5

## 2015-05-15 MED ORDER — ONDANSETRON HCL 4 MG/2ML IJ SOLN: 4.0000 mg | Freq: Four times a day (QID) | INTRAMUSCULAR | Status: DC | PRN

## 2015-05-15 MED ORDER — OCUVITE-LUTEIN PO CAPS
1.0000 | ORAL_CAPSULE | Freq: Every day | ORAL | Status: DC
  Administered 2015-05-15 – 2015-05-24 (×9): 1 via ORAL
  Filled 2015-05-15 (×9): qty 1

## 2015-05-15 MED ORDER — VITAMIN B-12 500 MCG SL SUBL: 500.0000 ug | SUBLINGUAL_TABLET | Freq: Every day | SUBLINGUAL | Status: DC

## 2015-05-15 MED ORDER — CYANOCOBALAMIN 500 MCG PO TABS
500.0000 ug | ORAL_TABLET | Freq: Every day | ORAL | Status: DC
  Administered 2015-05-15 – 2015-05-24 (×10): 500 ug via ORAL
  Filled 2015-05-15: qty 1
  Filled 2015-05-15: qty 5
  Filled 2015-05-15 (×2): qty 1
  Filled 2015-05-15: qty 5
  Filled 2015-05-15: qty 1
  Filled 2015-05-15 (×2): qty 5
  Filled 2015-05-15: qty 1
  Filled 2015-05-15 (×2): qty 5

## 2015-05-15 MED ORDER — ACETAMINOPHEN 325 MG PO TABS
650.0000 mg | ORAL_TABLET | Freq: Four times a day (QID) | ORAL | Status: DC | PRN
  Administered 2015-05-15 – 2015-05-24 (×15): 650 mg via ORAL
  Filled 2015-05-15 (×17): qty 2

## 2015-05-15 MED ORDER — DILTIAZEM HCL 25 MG/5ML IV SOLN
20.0000 mg | Freq: Once | INTRAVENOUS | Status: AC
  Administered 2015-05-15: 20 mg via INTRAVENOUS

## 2015-05-15 MED ORDER — METOPROLOL TARTRATE 1 MG/ML IV SOLN
5.0000 mg | INTRAVENOUS | Status: DC | PRN
  Administered 2015-05-19 – 2015-05-22 (×2): 5 mg via INTRAVENOUS
  Filled 2015-05-15 (×3): qty 5

## 2015-05-15 MED ORDER — METHYLPREDNISOLONE SODIUM SUCC 125 MG IJ SOLR
60.0000 mg | Freq: Three times a day (TID) | INTRAMUSCULAR | Status: DC
  Administered 2015-05-15 – 2015-05-19 (×11): 60 mg via INTRAVENOUS
  Filled 2015-05-15 (×11): qty 2

## 2015-05-15 MED ORDER — PANTOPRAZOLE SODIUM 40 MG PO TBEC
40.0000 mg | DELAYED_RELEASE_TABLET | Freq: Every day | ORAL | Status: DC
  Administered 2015-05-15 – 2015-05-16 (×2): 40 mg via ORAL
  Filled 2015-05-15 (×3): qty 1

## 2015-05-15 MED ORDER — CLOPIDOGREL BISULFATE 75 MG PO TABS
75.0000 mg | ORAL_TABLET | Freq: Every day | ORAL | Status: DC
  Administered 2015-05-15 – 2015-05-24 (×10): 75 mg via ORAL
  Filled 2015-05-15 (×10): qty 1

## 2015-05-15 MED ORDER — SODIUM CHLORIDE 0.9 % IJ SOLN
3.0000 mL | Freq: Two times a day (BID) | INTRAMUSCULAR | Status: DC
  Administered 2015-05-15 – 2015-05-16 (×2): 3 mL via INTRAVENOUS

## 2015-05-15 MED ORDER — FERROUS SULFATE 325 (65 FE) MG PO TABS
325.0000 mg | ORAL_TABLET | Freq: Every day | ORAL | Status: DC
  Administered 2015-05-15 – 2015-05-24 (×10): 325 mg via ORAL
  Filled 2015-05-15 (×10): qty 1

## 2015-05-15 MED ORDER — ASPIRIN EC 81 MG PO TBEC
81.0000 mg | DELAYED_RELEASE_TABLET | Freq: Every day | ORAL | Status: DC
  Administered 2015-05-15 – 2015-05-16 (×2): 81 mg via ORAL
  Filled 2015-05-15 (×3): qty 1

## 2015-05-15 MED ORDER — ATORVASTATIN CALCIUM 10 MG PO TABS
10.0000 mg | ORAL_TABLET | Freq: Every day | ORAL | Status: DC
  Administered 2015-05-15 – 2015-05-24 (×10): 10 mg via ORAL
  Filled 2015-05-15 (×11): qty 1

## 2015-05-15 MED ORDER — METOPROLOL SUCCINATE ER 100 MG PO TB24
100.0000 mg | ORAL_TABLET | Freq: Every day | ORAL | Status: DC
  Administered 2015-05-16 – 2015-05-24 (×9): 100 mg via ORAL
  Filled 2015-05-15: qty 1
  Filled 2015-05-15: qty 2
  Filled 2015-05-15 (×7): qty 1

## 2015-05-15 MED ORDER — METHYLPREDNISOLONE SODIUM SUCC 125 MG IJ SOLR
125.0000 mg | Freq: Once | INTRAMUSCULAR | Status: AC
  Administered 2015-05-15: 125 mg via INTRAVENOUS
  Filled 2015-05-15: qty 2

## 2015-05-15 MED ORDER — DILTIAZEM HCL 25 MG/5ML IV SOLN
INTRAVENOUS | Status: AC
Start: 2015-05-15 — End: 2015-05-15
  Administered 2015-05-15: 20 mg via INTRAVENOUS
  Filled 2015-05-15: qty 5

## 2015-05-15 MED ORDER — SERTRALINE HCL 25 MG PO TABS
25.0000 mg | ORAL_TABLET | Freq: Every day | ORAL | Status: DC
  Administered 2015-05-15: 50 mg via ORAL
  Administered 2015-05-16 – 2015-05-24 (×9): 25 mg via ORAL
  Filled 2015-05-15 (×9): qty 1

## 2015-05-15 MED ORDER — ACETAMINOPHEN 650 MG RE SUPP: 650.0000 mg | Freq: Four times a day (QID) | RECTAL | Status: DC | PRN

## 2015-05-15 MED ORDER — ONDANSETRON HCL 4 MG PO TABS: 4.0000 mg | ORAL_TABLET | Freq: Four times a day (QID) | ORAL | Status: DC | PRN

## 2015-05-15 MED ORDER — PRESERVISION AREDS 2 PO CAPS: 1.0000 | ORAL_CAPSULE | Freq: Every day | ORAL | Status: DC

## 2015-05-15 MED ORDER — METFORMIN HCL 500 MG PO TABS
500.0000 mg | ORAL_TABLET | Freq: Two times a day (BID) | ORAL | Status: DC
  Administered 2015-05-15 – 2015-05-16 (×2): 500 mg via ORAL
  Filled 2015-05-15 (×2): qty 1

## 2015-05-15 MED ORDER — DOCUSATE SODIUM 100 MG PO CAPS
100.0000 mg | ORAL_CAPSULE | Freq: Two times a day (BID) | ORAL | Status: DC | PRN
  Administered 2015-05-19: 100 mg via ORAL
  Filled 2015-05-15 (×2): qty 1

## 2015-05-15 MED ORDER — IPRATROPIUM-ALBUTEROL 0.5-2.5 (3) MG/3ML IN SOLN
3.0000 mL | Freq: Once | RESPIRATORY_TRACT | Status: AC
  Administered 2015-05-15: 3 mL via RESPIRATORY_TRACT
  Filled 2015-05-15: qty 3

## 2015-05-15 NOTE — Progress Notes (Signed)
Notified by ER RN Amber that patient's heart rate is b/w 140s-160s.ER RN has given Cardizem '30mg'$  IV. Paged Dr. Tressia Miners for assistance. Informed of HR. States she will evaulate patient.

## 2015-05-15 NOTE — H&P (Signed)
[place] [facility] [place] NAME: Regina Burke    MR#:  [id number]  DATE OF BIRTH:  16-Jan-1933  DATE OF ADMISSION:  05/15/2015  PRIMARY CARE PHYSICIAN: Tracie Harrier, MD   REQUESTING/REFERRING PHYSICIAN: Dr. Lenise Arena  CHIEF COMPLAINT:   Chief Complaint  Patient presents with  . Shortness of Breath    feeling sob x 1 week - wheezing noted    HISTORY OF PRESENT ILLNESS:  Regina Burke  is a 81 y.o. female with a known history of COPD on 3 L home oxygen, CAD status post stent, diastolic CHF with normal ejection fraction, sleep apnea, diabetes, hypertension and CK D presents to the hospital secondary to worsening breathing. Patient was recently in the hospital last week after she had a fall and extensive facial injuries. She was just discharged 4 days ago. After she went home, she felt fine for the next couple of days. Yesterday daughter noticed that patient was using her accessory muscles for breathing. She also was getting easily dyspneic on exertion. Mild worsening of her nocturnal cough noted. No fevers at home. This morning patient couldn't breathe at all and was brought to the hospital. Significant expiratory wheezing noted on exam here. Chest x-ray with mild pleural effusions on both sides noted. Heart rate elevated, and A. fib, in 130s.  PAST MEDICAL HISTORY:   Past Medical History  Diagnosis Date  . COPD (chronic obstructive pulmonary disease)   . CHF (congestive heart failure)   . Hypertension   . Rapid palpitations 2014    Seen at [facility], may have been atrial fibrillation  . DOE (dyspnea on exertion)     Started after treatment for lung cancer  . High cholesterol   . Heart murmur   . Myocardial infarction 12/22/2014   . Lung cancer dx'd 2014    S/P radiation 2015  . Sleep apnea   . Cushing's disease   . Type II diabetes mellitus   . Anemia   . GERD (gastroesophageal reflux disease)   . History of hiatal hernia    . Migraine   . Arthritis   . Gout   . Depression   . Chronic kidney disease (CKD), stage III (moderate)     PAST SURGICAL HISTORY:   Past Surgical History  Procedure Laterality Date  . Adrenalectomy Left 1980's    "Cushings"  . Abdominal hysterectomy    . Appendectomy    . Cholecystectomy    . Coronary angioplasty with stent placement  12/23/2014  . Tonsillectomy    . Fracture surgery    . Wrist fracture surgery Bilateral ~ 2000  . Breast cyst excision Left   . Tubal ligation    . Left heart catheterization with coronary angiogram N/A 12/23/2014    Procedure: LEFT HEART CATHETERIZATION WITH CORONARY ANGIOGRAM;  Surgeon: Burnell Blanks, MD;  Location: [facility] CATH LAB;  Service: Cardiovascular;  Laterality: N/A;  . Percutaneous coronary stent intervention (pci-s)  12/23/2014    Procedure: PERCUTANEOUS CORONARY STENT INTERVENTION (PCI-S);  Surgeon: Burnell Blanks, MD;  Location: [facility] CATH LAB;  Service: Cardiovascular;;  Promus 2.25x8    SOCIAL HISTORY:   Social History  Substance Use Topics  . Smoking status: Former Smoker -- 1.00 packs/day for 45 years    Types: Cigarettes    Quit date: 04/25/1998  . Smokeless tobacco: Never Used  . Alcohol Use: Yes     Comment: 12/23/2014 "might have a couple mixed drinks/year"    FAMILY  HISTORY:   Family History  Problem Relation Age of Onset  . Heart disease Mother   . Diabetes Mother   . Osteoarthritis Mother   . Hypertension Mother   . Heart disease Father   . Hypertension Father   . COPD Brother     DRUG ALLERGIES:   Allergies  Allergen Reactions  . Ciprofloxacin Shortness Of Breath, Itching and Rash  . Doxycycline Shortness Of Breath, Itching and Rash  . Penicillins Shortness Of Breath, Itching and Rash  . Sulfa Antibiotics Shortness Of Breath, Itching and Rash  . Morphine And Related Itching    REVIEW OF SYSTEMS:   Review of Systems  Constitutional: Negative for fever, chills, weight loss and  malaise/fatigue.  HENT: Negative for ear discharge, ear pain, hearing loss, nosebleeds and tinnitus.   Eyes: Negative for blurred vision, double vision and photophobia.  Respiratory: Positive for cough, shortness of breath and wheezing. Negative for hemoptysis.   Cardiovascular: Positive for palpitations. Negative for chest pain, orthopnea and leg swelling.  Gastrointestinal: Negative for heartburn, nausea, vomiting, abdominal pain, diarrhea, constipation and melena.  Genitourinary: Negative for dysuria, urgency, frequency and hematuria.  Musculoskeletal: Negative for myalgias, back pain and neck pain.  Skin: Negative for rash.  Neurological: Positive for weakness. Negative for dizziness, tingling, tremors, sensory change, speech change, focal weakness and headaches.  Endo/Heme/Allergies: Does not bruise/bleed easily.  Psychiatric/Behavioral: Negative for depression and memory loss. The patient is not nervous/anxious.     MEDICATIONS AT HOME:   Prior to Admission medications   Medication Sig Start Date End Date Taking? Authorizing Provider  ALPRAZolam (XANAX) 0.25 MG tablet Take 0.25 mg by mouth 2 (two) times daily. 05/12/15  Yes Historical Provider, MD  aspirin EC 81 MG tablet Take 1 tablet (81 mg total) by mouth daily. 04/13/15   Troy Sine, MD  atorvastatin (LIPITOR) 10 MG tablet Take 10 mg by mouth daily. 05/05/15   Historical Provider, MD  cholecalciferol (VITAMIN D) 400 UNITS TABS tablet Take 400 Units by mouth daily.    Historical Provider, MD  clopidogrel (PLAVIX) 75 MG tablet Take 1 tablet (75 mg total) by mouth daily. 12/24/14   Rhonda G Barrett, PA-C  Cyanocobalamin (VITAMIN B-12) 500 MCG SUBL Place 500 mcg under the tongue daily.    Historical Provider, MD  digoxin (DIGITEK) 0.125 MG tablet Take 0.125 mg by mouth daily.    Historical Provider, MD  diltiazem (DILACOR XR) 180 MG 24 hr capsule Take 180 mg by mouth daily.    Historical Provider, MD  febuxostat (ULORIC) 40 MG tablet  Take 40 mg by mouth daily.     Historical Provider, MD  fenofibrate (TRICOR) 48 MG tablet Take 48 mg by mouth daily.    Historical Provider, MD  ferrous sulfate 325 (65 FE) MG EC tablet Take 325 mg by mouth daily.     Historical Provider, MD  metFORMIN (GLUCOPHAGE) 500 MG tablet Take 500 mg by mouth 2 (two) times daily with a meal.    Historical Provider, MD  metoprolol succinate (TOPROL-XL) 100 MG 24 hr tablet Take 1 tablet (100 mg total) by mouth daily. Take with or immediately following a meal. 12/24/14   Evelene Croon Barrett, PA-C  Multiple Vitamins-Minerals (PRESERVISION AREDS 2) CAPS Take 1 capsule by mouth daily.    Historical Provider, MD  nitroGLYCERIN (NITROSTAT) 0.4 MG SL tablet Place 1 tablet (0.4 mg total) under the tongue every 5 (five) minutes as needed for chest pain. 12/24/14   Evelene Croon  Barrett, PA-C  pantoprazole (PROTONIX) 40 MG tablet Take 40 mg by mouth daily.    Historical Provider, MD  sertraline (ZOLOFT) 25 MG tablet Take 25 mg by mouth daily.    Historical Provider, MD  vitamin C (ASCORBIC ACID) 500 MG tablet Take 500 mg by mouth daily.    Historical Provider, MD      VITAL SIGNS:  Blood pressure 171/70, pulse 84, temperature 98.2 F (36.8 C), temperature source Oral, resp. rate 30, height '5\' 4"'$  (1.626 m), weight 74.844 kg (165 lb), SpO2 93 %.  PHYSICAL EXAMINATION:   Physical Exam  GENERAL:  81 y.o.-year-old patient lying in the bed with no acute distress.  EYES: right eye almost shut with swelling and bruising around the eye. Pupils are equal and round and reacting to light. Extra occular movements intact. HEENT: Head with recent trauma especially on right side of face with improving bruise, normocephalic. Oropharynx and nasopharynx clear.  NECK:  Supple, no jugular venous distention. No thyroid enlargement, no tenderness.  LUNGS: minimal use of accessory muscles noted, scattered expiratory wheeze bilaterally, decreased left basilar breath sounds, no rales, rhonchi   CARDIOVASCULAR: S1, S2 normal. No murmurs, rubs, or gallops. Rapid rate, irregular rhythm noted. ABDOMEN: Soft, nontender, nondistended. Bowel sounds present. No organomegaly or mass.  EXTREMITIES: No pedal edema, cyanosis, or clubbing.  NEUROLOGIC: Cranial nerves II through XII are intact. Muscle strength 5/5 in all extremities. Sensation intact. Gait not checked.  PSYCHIATRIC: The patient is alert and oriented x 3.  SKIN: No obvious rash, lesion, or ulcer.   LABORATORY PANEL:   CBC  Recent Labs Lab 05/15/15 1233  WBC 8.3  HGB 9.8*  HCT 29.6*  PLT 215   ------------------------------------------------------------------------------------------------------------------  Chemistries   Recent Labs Lab 05/15/15 1233  NA 140  K 5.4*  CL 103  CO2 29  GLUCOSE 119*  BUN 34*  CREATININE 1.13*  CALCIUM 9.0   ------------------------------------------------------------------------------------------------------------------  Cardiac Enzymes  Recent Labs Lab 05/15/15 1233  TROPONINI <0.03   ------------------------------------------------------------------------------------------------------------------  RADIOLOGY:  Dg Chest Port 1 View  05/15/2015   CLINICAL DATA:  Fall 1 week ago, shortness of Breath  EXAM: PORTABLE CHEST - 1 VIEW  COMPARISON:  12/22/2014  FINDINGS: Cardiomediastinal silhouette is stable. Central mild vascular joint without pulmonary edema. Small bilateral pleural effusion with bilateral basilar atelectasis or infiltrate. No pneumothorax.  IMPRESSION: Small bilateral pleural effusion with bilateral basilar atelectasis or infiltrate. No pulmonary edema. No pneumothorax.   Electronically Signed   By: Lahoma Crocker M.D.   On: 05/15/2015 12:40    EKG:   Orders placed or performed during the hospital encounter of 05/15/15  . ED EKG  . ED EKG  . EKG 12-Lead  . EKG 12-Lead    IMPRESSION AND PLAN:   Regina Burke  is a 81 y.o. female with a known history of  COPD on 3 L home oxygen, CAD status post stent, diastolic CHF with normal ejection fraction, sleep apnea, diabetes, hypertension and CK D presents to the hospital secondary to worsening breathing.  #1 acute on chronic COPD exacerbation- -Admit, continue oxygen support, IV steroids and DuoNeb's. -Empiric Z-Pak started.  #2 atrial fibrillation with rapid ventricular response-likely worsened from respiratory issues. -IV metoprolol when necessary. -Continue her by mouth metoprolol and also Cardizem. Recent echo with the normal ejection fraction, diastolic dysfunction noted.  #3 hypertension-continue home medications metoprolol and Cardizem.  #4 diabetes mellitus-patient is on metformin at this time.  #5 hyperkalemia-recent admission with hyperkalemia as well.  Her valsartan has been stopped at the time. No other source noted at this time. Continue to monitor.  #6 CKD stage III-stable at this time.  #7 DVT prophylaxis-Lovenox   All the records are reviewed and case discussed with ED provider. Management plans discussed with the patient, family and they are in agreement.  CODE STATUS: Full Code  TOTAL TIME TAKING CARE OF THIS PATIENT: 50 minutes.    Gladstone Lighter M.D on 05/15/2015 at 3:06 PM  Between 7am to 6pm - Pager - [phone]  After 6pm go to [email or web address] - password [email or web address] [email or web address] [facility]  Office  [phone]  CC: Primary care physician; Tracie Harrier, MD

## 2015-05-15 NOTE — ED Provider Notes (Signed)
[facility] Emergency Department Regina Burke Note     Time seen: ----------------------------------------- 12:09 PM on 05/15/2015 -----------------------------------------    I have reviewed the triage vital signs and the nursing notes.   HISTORY  Chief Complaint Shortness of Breath    HPI Regina Burke is a 81 y.o. female who presents ER for shortness of breath. Patient states shortness breath started while she was in the hospital. She was recently admitted after a fall, states dyspnea progressed to she got home. She denies any fevers or chills. She denies any chest pain. Patient states she cannot lay flat due to the dyspnea.   Past Medical History  Diagnosis Date  . COPD (chronic obstructive pulmonary disease)   . CHF (congestive heart failure)   . Hypertension   . Rapid palpitations 2014    Seen at [facility], may have been atrial fibrillation  . DOE (dyspnea on exertion)     Started after treatment for lung cancer  . High cholesterol   . Heart murmur   . Myocardial infarction 12/22/2014   . Lung cancer dx'd 2014    S/P radiation 2015  . Sleep apnea   . Cushing's disease   . Type II diabetes mellitus   . Anemia   . GERD (gastroesophageal reflux disease)   . History of hiatal hernia   . Migraine   . Arthritis   . Gout   . Depression   . Chronic kidney disease (CKD), stage III (moderate)     Patient Active Problem List   Diagnosis Date Noted  . Dehydration 05/09/2015  . Diabetes 05/09/2015  . Closed fracture nasal bone 05/09/2015  . Preoperative clearance 04/15/2015  . Hyperkalemia 04/14/2015  . CAD (coronary artery disease) 04/14/2015  . Cancer of upper lobe of right lung 01/08/2015  . Allergic state 01/08/2015  . Absolute anemia 01/08/2015  . A-fib 01/08/2015  . Chronic kidney disease 01/08/2015  . CAFL (chronic airflow limitation) 01/08/2015  . Arthritis, degenerative 01/08/2015  . 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  Unstable angina 12/23/2014  . Hypertension   . Rapid palpitations   . Diabetes mellitus, type 2 09/11/2014    Past Surgical History  Procedure Laterality Date  . Adrenalectomy Left 1980's    "Cushings"  . Abdominal hysterectomy    . Appendectomy    . Cholecystectomy    . Coronary angioplasty with stent placement  12/23/2014  . Tonsillectomy    . Fracture surgery    . Wrist fracture surgery Bilateral ~ 2000  . Breast cyst excision Left   . Tubal ligation    . Left heart catheterization with coronary angiogram N/A 12/23/2014    Procedure: LEFT HEART CATHETERIZATION WITH CORONARY ANGIOGRAM;  Surgeon: Burnell Blanks, MD;  Location: [facility] CATH LAB;  Service: Cardiovascular;  Laterality: N/A;  . Percutaneous coronary stent intervention (pci-s)  12/23/2014    Procedure: PERCUTANEOUS CORONARY STENT INTERVENTION (PCI-S);  Surgeon: Burnell Blanks, MD;  Location: [facility] CATH LAB;  Service: Cardiovascular;;  Promus 2.25x8    Allergies Ciprofloxacin; Doxycycline; Penicillins; Sulfa antibiotics; and Morphine and related  Social History Social History  Substance Use Topics  . Smoking status: Former Smoker -- 1.00 packs/day for 45 years    Types: Cigarettes    Quit date: 04/25/1998  . Smokeless tobacco: Never Used  . Alcohol Use: Yes     Comment: 12/23/2014 "might have a couple mixed drinks/year"    Review of Systems Constitutional: Negative for fever. Eyes: Negative for visual changes. ENT:  Negative for sore throat. Cardiovascular: Negative for chest pain. Respiratory: Positive shortness of breath Gastrointestinal: Negative for abdominal pain, vomiting and diarrhea. Genitourinary: Negative for dysuria. Musculoskeletal: Negative for back pain. Skin: Negative for rash. Neurological: Positive for headaches, focal weakness or numbness.  10-point ROS otherwise negative.  ____________________________________________   PHYSICAL EXAM:  VITAL SIGNS: ED Triage Vitals  Enc  Vitals Group     BP 05/15/15 1143 171/70 mmHg     Pulse Rate 05/15/15 1150 84     Resp 05/15/15 1150 30     Temp 05/15/15 1143 98.2 F (36.8 C)     Temp Source 05/15/15 1143 Oral     SpO2 05/15/15 1150 93 %     Weight 05/15/15 1150 165 lb (74.844 kg)     Height 05/15/15 1143 '5\' 4"'$  (1.626 m)     Head Cir --      Peak Flow --      Pain Score 05/15/15 1152 8     Pain Loc --      Pain Edu? --      Excl. in [place]? --     Constitutional: Alert and oriented. Mild distress. Eyes: Conjunctivae are normal. PERRL. Normal extraocular movements. ENT   Head: Extensive remote facial ecchymosis   Nose: No congestion/rhinnorhea.   Mouth/Throat: Mucous membranes are moist.   Neck: No stridor. Cardiovascular: Irregularly irregular rhythm. Normal and symmetric distal pulses are present in all extremities. No murmurs, rubs, or gallops. Respiratory: Mild wheezing bilaterally with tachypnea. Gastrointestinal: Soft and nontender. No distention. No abdominal bruits.  Musculoskeletal: Nontender with normal range of motion in all extremities. No joint effusions.  No lower extremity tenderness nor edema. Neurologic:  Normal speech and language. No gross focal neurologic deficits are appreciated. Speech is normal. No gait instability. Skin:  Skin is warm, dry and intact. No rash noted. Psychiatric: Mood and affect are normal. Speech and behavior are normal. Patient exhibits appropriate insight and judgment. ____________________________________________  EKG: Interpreted by me. Normal sinus rhythm rate 79 bpm, normal PR interval, normal QRS width. Anteroseptal infarct. No evidence of acute infarction  ____________________________________________  ED COURSE:  Pertinent labs & imaging results that were available during my care of the patient were reviewed by me and considered in my medical decision making (see chart for details). Patient is wheezing, will received DuoNeb as well as IV Solu-Medrol.  We'll check cardiac labs and reevaluate. ____________________________________________    LABS (pertinent positives/negatives)  Labs Reviewed  CBC WITH DIFFERENTIAL/PLATELET - Abnormal; Notable for the following:    RBC 3.76 (*)    Hemoglobin 9.8 (*)    HCT 29.6 (*)    MCV 78.8 (*)    RDW 16.4 (*)    Neutro Abs 6.8 (*)    Lymphs Abs 0.8 (*)    All other components within normal limits  BASIC METABOLIC PANEL - Abnormal; Notable for the following:    Potassium 5.4 (*)    Glucose, Bld 119 (*)    BUN 34 (*)    Creatinine, Ser 1.13 (*)    GFR calc non Af Amer 45 (*)    GFR calc Af Amer 52 (*)    All other components within normal limits  BRAIN NATRIURETIC PEPTIDE - Abnormal; Notable for the following:    B Natriuretic Peptide 560.0 (*)    All other components within normal limits  TROPONIN I   CRITICAL CARE Performed by: Lenise Arena E   Total critical care time: 30 minutes  Critical  care time was exclusive of separately billable procedures and treating other patients.  Critical care was necessary to treat or prevent imminent or life-threatening deterioration.  Critical care was time spent personally by me on the following activities: development of treatment plan with patient and/or surrogate as well as nursing, discussions with consultants, evaluation of patient's response to treatment, examination of patient, obtaining history from patient or surrogate, ordering and performing treatments and interventions, ordering and review of laboratory studies, ordering and review of radiographic studies, pulse oximetry and re-evaluation of patient's condition.   RADIOLOGY Images were viewed by me  Chest x-ray IMPRESSION: Small bilateral pleural effusion with bilateral basilar atelectasis or infiltrate. No pulmonary edema. No pneumothorax. ____________________________________________  FINAL ASSESSMENT AND PLAN  Dyspnea, rapid atrial fibrillation  Plan: Patient with labs  and imaging as dictated above. Patient is in no acute distress, lungs are somewhat improved after DuoNeb however now she is in rapid atrial fibrillation. She is cried 2 doses of IV Cardizem to control her heart rate. Blood pressure is 167/77.   Earleen Newport, MD   Earleen Newport, MD 05/15/15 [phone]

## 2015-05-15 NOTE — ED Notes (Signed)
[place] one week ago and broke nose - was d/c'd weds. Feeling sob since falling - wheezing noted

## 2015-05-16 ENCOUNTER — Inpatient Hospital Stay: Payer: Medicare Other

## 2015-05-16 DIAGNOSIS — J9621 Acute and chronic respiratory failure with hypoxia: Secondary | ICD-10-CM

## 2015-05-16 DIAGNOSIS — J9622 Acute and chronic respiratory failure with hypercapnia: Secondary | ICD-10-CM

## 2015-05-16 DIAGNOSIS — J9600 Acute respiratory failure, unspecified whether with hypoxia or hypercapnia: Secondary | ICD-10-CM

## 2015-05-16 LAB — BLOOD GAS, ARTERIAL
Acid-base deficit: 0.7 mmol/L (ref 0.0–2.0)
Acid-base deficit: 3.5 mmol/L — ABNORMAL HIGH (ref 0.0–2.0)
BICARBONATE: 26.9 meq/L (ref 21.0–28.0)
Bicarbonate: 29.7 mEq/L — ABNORMAL HIGH (ref 21.0–28.0)
FIO2: 0.5
FIO2: 1
MECHANICAL RATE: 18
MECHVT: 450 mL
O2 Saturation: 88.9 %
O2 Saturation: 96.3 %
PATIENT TEMPERATURE: 37
PEEP: 5 cmH2O
PH ART: 7.06 — AB (ref 7.350–7.450)
PO2 ART: 93 mmHg (ref 83.0–108.0)
Patient temperature: 37
pCO2 arterial: 105 mmHg (ref 32.0–48.0)
pCO2 arterial: 56 mmHg — ABNORMAL HIGH (ref 32.0–48.0)
pH, Arterial: 7.29 — ABNORMAL LOW (ref 7.350–7.450)
pO2, Arterial: 79 mmHg — ABNORMAL LOW (ref 83.0–108.0)

## 2015-05-16 LAB — BASIC METABOLIC PANEL
ANION GAP: 7 (ref 5–15)
BUN: 50 mg/dL — ABNORMAL HIGH (ref 6–20)
CHLORIDE: 103 mmol/L (ref 101–111)
CO2: 28 mmol/L (ref 22–32)
Calcium: 8.5 mg/dL — ABNORMAL LOW (ref 8.9–10.3)
Creatinine, Ser: 1.79 mg/dL — ABNORMAL HIGH (ref 0.44–1.00)
GFR calc Af Amer: 30 mL/min — ABNORMAL LOW (ref >60)
GFR, EST NON AFRICAN AMERICAN: 26 mL/min — AB (ref >60)
GLUCOSE: 321 mg/dL — AB (ref 65–99)
POTASSIUM: 5.3 mmol/L — AB (ref 3.5–5.1)
Sodium: 138 mmol/L (ref 135–145)

## 2015-05-16 LAB — COMPREHENSIVE METABOLIC PANEL
ALBUMIN: 3.6 g/dL (ref 3.5–5.0)
ALT: 16 U/L (ref 14–54)
AST: 30 U/L (ref 15–41)
Alkaline Phosphatase: 41 U/L (ref 38–126)
Anion gap: 8 (ref 5–15)
BUN: 61 mg/dL — AB (ref 6–20)
CHLORIDE: 98 mmol/L — AB (ref 101–111)
CO2: 27 mmol/L (ref 22–32)
CREATININE: 1.83 mg/dL — AB (ref 0.44–1.00)
Calcium: 8.4 mg/dL — ABNORMAL LOW (ref 8.9–10.3)
GFR calc Af Amer: 29 mL/min — ABNORMAL LOW (ref >60)
GFR, EST NON AFRICAN AMERICAN: 25 mL/min — AB (ref >60)
GLUCOSE: 344 mg/dL — AB (ref 65–99)
POTASSIUM: 7 mmol/L — AB (ref 3.5–5.1)
Sodium: 133 mmol/L — ABNORMAL LOW (ref 135–145)
Total Bilirubin: 0.7 mg/dL (ref 0.3–1.2)
Total Protein: 6.5 g/dL (ref 6.5–8.1)

## 2015-05-16 LAB — HEPATIC FUNCTION PANEL
ALK PHOS: 41 U/L (ref 38–126)
ALT: 15 U/L (ref 14–54)
AST: 30 U/L (ref 15–41)
Albumin: 3.6 g/dL (ref 3.5–5.0)
BILIRUBIN TOTAL: 0.8 mg/dL (ref 0.3–1.2)
Bilirubin, Direct: <0.1 mg/dL — ABNORMAL LOW (ref 0.1–0.5)
TOTAL PROTEIN: 6.4 g/dL — AB (ref 6.5–8.1)

## 2015-05-16 LAB — CBC
HEMATOCRIT: 25.6 % — AB (ref 35.0–47.0)
HEMATOCRIT: 29.7 % — AB (ref 35.0–47.0)
HEMOGLOBIN: 8.5 g/dL — AB (ref 12.0–16.0)
HEMOGLOBIN: 9.4 g/dL — AB (ref 12.0–16.0)
MCH: 26.5 pg (ref 26.0–34.0)
MCH: 25.6 pg — ABNORMAL LOW (ref 26.0–34.0)
MCHC: 33.3 g/dL (ref 32.0–36.0)
MCHC: 31.8 g/dL — AB (ref 32.0–36.0)
MCV: 79.7 fL — ABNORMAL LOW (ref 80.0–100.0)
MCV: 80.4 fL (ref 80.0–100.0)
Platelets: 194 10*3/uL (ref 150–440)
Platelets: 317 10*3/uL (ref 150–400)
RBC: 3.21 MIL/uL — ABNORMAL LOW (ref 3.80–5.20)
RBC: 3.69 MIL/uL — ABNORMAL LOW (ref 3.80–5.20)
RDW: 16.2 % — ABNORMAL HIGH (ref 11.5–14.5)
RDW: 16.4 % — AB (ref 11.5–14.5)
WBC: 11.1 10*3/uL — AB (ref 3.6–11.0)
WBC: 29.4 10*3/uL — AB (ref 3.6–11.0)

## 2015-05-16 LAB — GLUCOSE, CAPILLARY: Glucose-Capillary: 293 mg/dL — ABNORMAL HIGH (ref 65–99)

## 2015-05-16 LAB — PHOSPHORUS: PHOSPHORUS: 8.1 mg/dL — AB (ref 2.5–4.6)

## 2015-05-16 LAB — PROTIME-INR
INR: 1.17
Prothrombin Time: 15.1 seconds — ABNORMAL HIGH (ref 11.4–15.0)

## 2015-05-16 LAB — MAGNESIUM: Magnesium: 2 mg/dL (ref 1.7–2.4)

## 2015-05-16 LAB — APTT

## 2015-05-16 LAB — LACTIC ACID, PLASMA: Lactic Acid, Venous: 1.7 mmol/L (ref 0.5–2.0)

## 2015-05-16 MED ORDER — FENTANYL 2500MCG IN NS 250ML (10MCG/ML) PREMIX INFUSION
25.0000 ug/h | INTRAVENOUS | Status: DC
  Administered 2015-05-17: 50 ug/h via INTRAVENOUS
  Filled 2015-05-16: qty 250

## 2015-05-16 MED ORDER — ENOXAPARIN SODIUM 30 MG/0.3ML ~~LOC~~ SOLN
30.0000 mg | SUBCUTANEOUS | Status: DC
  Administered 2015-05-17 – 2015-05-18 (×3): 30 mg via SUBCUTANEOUS
  Filled 2015-05-16 (×3): qty 0.3

## 2015-05-16 MED ORDER — NOREPINEPHRINE 4 MG/250ML-% IV SOLN: 0.0000 ug/min | INTRAVENOUS | Status: DC

## 2015-05-16 MED ORDER — MIDAZOLAM HCL 2 MG/2ML IJ SOLN: 4.0000 mg | Freq: Once | INTRAMUSCULAR | Status: DC

## 2015-05-16 MED ORDER — MIDAZOLAM HCL 2 MG/2ML IJ SOLN
INTRAMUSCULAR | Status: AC
  Filled 2015-05-16: qty 4

## 2015-05-16 MED ORDER — VECURONIUM BROMIDE 10 MG IV SOLR
10.0000 mg | Freq: Once | INTRAVENOUS | Status: AC
  Administered 2015-05-16: 10 mg via INTRAVENOUS

## 2015-05-16 MED ORDER — NOREPINEPHRINE BITARTRATE 1 MG/ML IV SOLN: 0.0000 ug/min | INTRAVENOUS | Status: DC

## 2015-05-16 MED ORDER — STERILE WATER FOR INJECTION IJ SOLN
INTRAMUSCULAR | Status: AC
  Administered 2015-05-17
  Filled 2015-05-16: qty 10

## 2015-05-16 MED ORDER — FENTANYL CITRATE (PF) 100 MCG/2ML IJ SOLN
100.0000 ug | Freq: Once | INTRAMUSCULAR | Status: AC
  Administered 2015-05-16: 100 ug via INTRAVENOUS

## 2015-05-16 MED ORDER — FENTANYL CITRATE (PF) 100 MCG/2ML IJ SOLN
INTRAMUSCULAR | Status: AC
  Administered 2015-05-16: 100 ug
  Filled 2015-05-16: qty 2

## 2015-05-16 MED ORDER — VECURONIUM BROMIDE 10 MG IV SOLR
INTRAVENOUS | Status: AC
Start: 2015-05-16 — End: 2015-05-16
  Administered 2015-05-16: 21:00:00
  Filled 2015-05-16: qty 10

## 2015-05-16 MED ORDER — IPRATROPIUM-ALBUTEROL 0.5-2.5 (3) MG/3ML IN SOLN
3.0000 mL | RESPIRATORY_TRACT | Status: DC | PRN
  Administered 2015-05-16 – 2015-05-24 (×17): 3 mL via RESPIRATORY_TRACT
  Filled 2015-05-16 (×17): qty 3

## 2015-05-16 NOTE — Procedures (Signed)
Endotracheal Intubation: Patient required placement of an artificial airway secondary to resp failure.   Consent: Emergent.   Hand washing performed prior to starting the procedure.   Medications administered for sedation prior to procedure: Midazolam 4 mg IV,  Vecuronium 10 mg IV, Fentanyl 100 mcg IV.   Procedure: A time out procedure was called and correct patient, name, & ID confirmed. Needed supplies and equipment were assembled and checked to include ETT, 10 ml syringe, Glidescope, Mac and Miller blades, suction, oxygen and bag mask valve, end tidal CO2 monitor. Patient was positioned to align the mouth and pharynx to facilitate visualization of the glottis.  Heart rate, SpO2 and blood pressure was continuously monitored during the procedure. Pre-oxygenation was conducted prior to intubation and endotracheal tube was placed through the vocal cords into the trachea.  During intubation an assistant applied gentle pressure to the cricoid cartilage.   The artificial airway was placed under direct visualization via glidescope route using a 7.5 ETT on the second attempt.    ETT was secured at 23 cm mark.    Placement was confirmed by auscuitation of lungs with good breath sounds bilaterally and no stomach sounds.  Condensation was noted on endotracheal tube.  Pulse ox 95%.  CO2 detector in place with appropriate color change.   Complications: None .   Operator: Kasa.   Chest radiograph ordered and pending.   Comments: OGT placed via glidescope.  Corrin Parker, M.D.  [facility]  Medical Director [place] Director [facility] Cardio-Pulmonary Department

## 2015-05-16 NOTE — Progress Notes (Signed)
Pt found unresponsive in room by family.  Rapid response called on nurse arrival to room.  Pt with agonal respers, unresponsive to sternal rub.  Oxygen changed from [facility] to nonrebreather mask.  FSBS= 293.  SR continues on telemetry with palpable pulse.  Skin warm, clammy.  Hydrographic surveyor, CCU RN and Respiratory therapy in to assess.  Dr. Gilford Rile notified of change in condition.  Orders given for transfer.

## 2015-05-16 NOTE — Consult Note (Signed)
[facility] Consultation      Name: ZIONA WICKENS MRN: [id number] DOB: Nov 03, 1932    ADMISSION DATE:  05/15/2015   CHIEF COMPLAINT:   Acute resp failure   HISTORY OF PRESENT ILLNESS  81 yo white female transferred to ICU for acute resp failure, patient unresponsive and using accessory muscles to breathe,  patient emergently intubated, placed on sedation, on full  vetn support  Patient recently admitted for s/p fall with  acute facial fractures Patient was in ICU last 24 for afib withRVR, patient supposedly was found unresponsive 2 hrs ago and was noted to have used the bathroom without any problems. Then family walked into patients room and was found unresponsive with agonal respirations  Patient critically ill, high risk for cardiac arrest/death   SIGNIFICANT EVENTS   06-03-23 intubated  PAST MEDICAL HISTORY    :  Past Medical History  Diagnosis Date  . COPD (chronic obstructive pulmonary disease)   . CHF (congestive heart failure)   . Hypertension   . Rapid palpitations 2014    Seen at [facility], may have been atrial fibrillation  . DOE (dyspnea on exertion)     Started after treatment for lung cancer  . High cholesterol   . Heart murmur   . Myocardial infarction 12/22/2014   . Lung cancer dx'd 2014    S/P radiation 2015  . Sleep apnea   . Cushing's disease   . Type II diabetes mellitus   . Anemia   . GERD (gastroesophageal reflux disease)   . History of hiatal hernia   . Migraine   . Arthritis   . Gout   . Depression   . Chronic kidney disease (CKD), stage III (moderate)    Past Surgical History  Procedure Laterality Date  . Adrenalectomy Left 1980's    "Cushings"  . Abdominal hysterectomy    . Appendectomy    . Cholecystectomy    . Coronary angioplasty with stent placement  12/23/2014  . Tonsillectomy    . Fracture surgery    . Wrist fracture surgery Bilateral ~ 2000  . Breast cyst excision Left   . Tubal ligation    . Left heart  catheterization with coronary angiogram N/A 12/23/2014    Procedure: LEFT HEART CATHETERIZATION WITH CORONARY ANGIOGRAM;  Surgeon: Burnell Blanks, MD;  Location: [facility] CATH LAB;  Service: Cardiovascular;  Laterality: N/A;  . Percutaneous coronary stent intervention (pci-s)  12/23/2014    Procedure: PERCUTANEOUS CORONARY STENT INTERVENTION (PCI-S);  Surgeon: Burnell Blanks, MD;  Location: [facility] CATH LAB;  Service: Cardiovascular;;  Promus 2.25x8   Prior to Admission medications   Medication Sig Start Date End Date Taking? Authorizing Provider  ALPRAZolam (XANAX) 0.25 MG tablet Take 0.25 mg by mouth 2 (two) times daily. 05/12/15  Yes Historical Provider, MD  aspirin EC 81 MG tablet Take 1 tablet (81 mg total) by mouth daily. 04/13/15  Yes Troy Sine, MD  atorvastatin (LIPITOR) 10 MG tablet Take 10 mg by mouth daily.   Yes Historical Provider, MD  cholecalciferol (VITAMIN D) 400 UNITS TABS tablet Take 400 Units by mouth daily.   Yes Historical Provider, MD  clopidogrel (PLAVIX) 75 MG tablet Take 1 tablet (75 mg total) by mouth daily. 12/24/14  Yes Rhonda G Barrett, PA-C  Cyanocobalamin (VITAMIN B-12) 500 MCG SUBL Place 500 mcg under the tongue daily.   Yes Historical Provider, MD  digoxin (DIGITEK) 0.125 MG tablet Take 0.125 mg by mouth daily.   Yes  Historical Provider, MD  diltiazem (DILACOR XR) 180 MG 24 hr capsule Take 180 mg by mouth daily.   Yes Historical Provider, MD  febuxostat (ULORIC) 40 MG tablet Take 40 mg by mouth daily.    Yes Historical Provider, MD  fenofibrate (TRICOR) 48 MG tablet Take 48 mg by mouth daily.   Yes Historical Provider, MD  ferrous sulfate 325 (65 FE) MG EC tablet Take 325 mg by mouth daily.    Yes Historical Provider, MD  ibuprofen (ADVIL,MOTRIN) 200 MG tablet Take 400 mg by mouth every 4 (four) hours as needed for headache, mild pain or moderate pain.   Yes Historical Provider, MD  metFORMIN (GLUCOPHAGE) 500 MG tablet Take 500 mg by mouth 2 (two) times daily  with a meal.   Yes Historical Provider, MD  metoprolol succinate (TOPROL-XL) 100 MG 24 hr tablet Take 1 tablet (100 mg total) by mouth daily. Take with or immediately following a meal. 12/24/14  Yes Rhonda G Barrett, PA-C  Multiple Vitamins-Minerals (PRESERVISION AREDS 2) CAPS Take 1 capsule by mouth daily.   Yes Historical Provider, MD  naproxen sodium (ALEVE) 220 MG tablet Take 220 mg by mouth 2 (two) times daily with a meal.   Yes Historical Provider, MD  nitroGLYCERIN (NITROSTAT) 0.4 MG SL tablet Place 1 tablet (0.4 mg total) under the tongue every 5 (five) minutes as needed for chest pain. 12/24/14  Yes Rhonda G Barrett, PA-C  pantoprazole (PROTONIX) 40 MG tablet Take 40 mg by mouth daily.   Yes Historical Provider, MD  sertraline (ZOLOFT) 25 MG tablet Take 25 mg by mouth daily.   Yes Historical Provider, MD  vitamin C (ASCORBIC ACID) 500 MG tablet Take 500 mg by mouth daily.   Yes Historical Provider, MD   Allergies  Allergen Reactions  . Ciprofloxacin Shortness Of Breath, Itching and Rash  . Doxycycline Shortness Of Breath, Itching and Rash  . Penicillins Shortness Of Breath, Itching and Rash  . Sulfa Antibiotics Shortness Of Breath, Itching and Rash  . Morphine And Related Itching     FAMILY HISTORY   Family History  Problem Relation Age of Onset  . Heart disease Mother   . Diabetes Mother   . Osteoarthritis Mother   . Hypertension Mother   . Heart disease Father   . Hypertension Father   . COPD Brother       SOCIAL HISTORY    reports that she quit smoking about 17 years ago. Her smoking use included Cigarettes. She has a 45 pack-year smoking history. She has never used smokeless tobacco. She reports that she drinks alcohol. She reports that she does not use illicit drugs.  Review of Systems  Unable to perform ROS: critical illness      VITAL SIGNS    Temp:  [97.7 F (36.5 C)-98.5 F (36.9 C)] 98.3 F (36.8 C) (09/18 1622) Pulse Rate:  [74-133] 80 (09/18  1622) Resp:  [14-29] 24 (09/18 1622) BP: (111-144)/(44-83) 125/74 mmHg (09/18 1622) SpO2:  [92 %-97 %] 95 % (09/18 1622) FiO2 (%):  [3 %] 3 % (09/18 1622) Weight:  [164 lb 6.4 oz (74.571 kg)] 164 lb 6.4 oz (74.571 kg) (09/18 1643) HEMODYNAMICS:   VENTILATOR SETTINGS: Vent Mode:  [-]  FiO2 (%):  [3 %] 3 % INTAKE / OUTPUT:  Intake/Output Summary (Last 24 hours) at 05/16/15 2121 Last data filed at 05/16/15 0600  Gross per 24 hour  Intake    815 ml  Output  0 ml  Net    815 ml       PHYSICAL EXAM   Physical Exam  Constitutional: She is oriented to person, place, and time. She appears well-developed and well-nourished. She appears distressed.  HENT:  Head: Normocephalic and atraumatic.  Mouth/Throat: No oropharyngeal exudate.  Eyes: EOM are normal. Pupils are equal, round, and reactive to light. No scleral icterus.  Neck: Normal range of motion. Neck supple.  Cardiovascular: Normal rate, regular rhythm and normal heart sounds.   No murmur heard. Pulmonary/Chest: No stridor. She is in respiratory distress. She has wheezes. She has rales.  resp distress  Abdominal: Soft. Bowel sounds are normal. She exhibits no distension. There is no tenderness. There is no rebound.  Musculoskeletal: Normal range of motion. She exhibits no edema.  Neurological: She is alert and oriented to person, place, and time. She displays normal reflexes. Coordination normal.  gcs<8T  Skin: Skin is warm. No rash noted. She is diaphoretic.  Psychiatric: She has a normal mood and affect.       LABS   LABS:  CBC  Recent Labs Lab 05/11/15 0445 05/15/15 1233 05/16/15 0514  WBC 8.7 8.3 11.1*  HGB 8.9* 9.8* 8.5*  HCT 26.9* 29.6* 25.6*  PLT 147* 215 194   Coag's No results for input(s): APTT, INR in the last 168 hours. BMET  Recent Labs Lab 05/11/15 0445 05/15/15 1233 05/16/15 0514  NA 140 140 138  K 4.2 5.4* 5.3*  CL 105 103 103  CO2 '29 29 28  '$ BUN 20 34* 50*  CREATININE 1.05*  1.13* 1.79*  GLUCOSE 114* 119* 321*   Electrolytes  Recent Labs Lab 05/11/15 0445 05/15/15 1233 05/16/15 0514  CALCIUM 8.4* 9.0 8.5*   Sepsis Markers No results for input(s): LATICACIDVEN, PROCALCITON, O2SATVEN in the last 168 hours. ABG No results for input(s): PHART, PCO2ART, PO2ART in the last 168 hours. Liver Enzymes No results for input(s): AST, ALT, ALKPHOS, BILITOT, ALBUMIN in the last 168 hours. Cardiac Enzymes  Recent Labs Lab 05/15/15 1233  TROPONINI <0.03   Glucose  Recent Labs Lab 05/10/15 0722 05/10/15 1123 05/10/15 1608 05/10/15 2111 05/11/15 0716 05/16/15 2032  GLUCAP 112* 136* 96 138* 96 293*     Recent Results (from the past 240 hour(s))  MRSA PCR Screening     Status: None   Collection Time: 05/15/15  6:10 PM  Result Value Ref Range Status   MRSA by PCR NEGATIVE NEGATIVE Final    Comment:        The GeneXpert MRSA Assay (FDA approved for NASAL specimens only), is one component of a comprehensive MRSA colonization surveillance program. It is not intended to diagnose MRSA infection nor to guide or monitor treatment for MRSA infections.      Current facility-administered medications:  .  acetaminophen (TYLENOL) tablet 650 mg, 650 mg, Oral, Q6H PRN, 650 mg at 05/16/15 1636 **OR** acetaminophen (TYLENOL) suppository 650 mg, 650 mg, Rectal, Q6H PRN, Gladstone Lighter, MD .  aspirin EC tablet 81 mg, 81 mg, Oral, Daily, Gladstone Lighter, MD, 81 mg at 05/16/15 0937 .  atorvastatin (LIPITOR) tablet 10 mg, 10 mg, Oral, Daily, Gladstone Lighter, MD, 10 mg at 05/16/15 0936 .  [COMPLETED] azithromycin (ZITHROMAX) tablet 500 mg, 500 mg, Oral, Daily, 500 mg at 05/15/15 2154 **FOLLOWED BY** azithromycin (ZITHROMAX) tablet 250 mg, 250 mg, Oral, Daily, Gladstone Lighter, MD, 250 mg at 05/16/15 0936 .  cholecalciferol (VITAMIN D) tablet 400 Units, 400 Units, Oral, Daily, Gladstone Lighter, MD,  400 Units at 05/16/15 0937 .  clopidogrel (PLAVIX) tablet 75  mg, 75 mg, Oral, Daily, Gladstone Lighter, MD, 75 mg at 05/16/15 0937 .  cyanocobalamin tablet 500 mcg, 500 mcg, Oral, Daily, Lenis Noon, RPH, 500 mcg at 05/16/15 0258 .  digoxin (LANOXIN) tablet 0.125 mg, 0.125 mg, Oral, Daily, Gladstone Lighter, MD, 0.125 mg at 05/16/15 0936 .  diltiazem (DILACOR XR) 24 hr capsule 180 mg, 180 mg, Oral, Daily, Gladstone Lighter, MD, 180 mg at 05/16/15 0942 .  docusate sodium (COLACE) capsule 100 mg, 100 mg, Oral, BID PRN, Gladstone Lighter, MD .  enoxaparin (LOVENOX) injection 30 mg, 30 mg, Subcutaneous, Q24H, Vishwanath Hande, MD .  febuxostat (ULORIC) tablet 40 mg, 40 mg, Oral, Daily, Gladstone Lighter, MD, 40 mg at 05/16/15 0942 .  fenofibrate tablet 54 mg, 54 mg, Oral, Daily, Gladstone Lighter, MD, 54 mg at 05/16/15 0936 .  fentaNYL (SUBLIMAZE) 100 MCG/2ML injection, , , ,  .  fentaNYL (SUBLIMAZE) injection 100 mcg, 100 mcg, Intravenous, Once, Flora Lipps, MD .  fentaNYL 2575mg in NS 2571m(1067mml) infusion-PREMIX, 25-400 mcg/hr, Intravenous, Continuous, KurFlora LippsD .  ferrous sulfate tablet 325 mg, 325 mg, Oral, Daily, RadGladstone LighterD, 325 mg at 05/16/15 0937 .  ipratropium-albuterol (DUONEB) 0.5-2.5 (3) MG/3ML nebulizer solution 3 mL, 3 mL, Nebulization, Q4H PRN, RadGladstone LighterD, 3 mL at 05/16/15 1636 .  methylPREDNISolone sodium succinate (SOLU-MEDROL) 125 mg/2 mL injection 60 mg, 60 mg, Intravenous, 3 times per day, RadGladstone LighterD, 60 mg at 05/16/15 1544 .  metoprolol (LOPRESSOR) injection 5 mg, 5 mg, Intravenous, Q4H PRN, RadGladstone LighterD .  metoprolol succinate (TOPROL-XL) 24 hr tablet 100 mg, 100 mg, Oral, Daily, RadGladstone LighterD, 100 mg at 05/16/15 0936 .  midazolam (VERSED) 2 MG/2ML injection, , , ,  .  midazolam (VERSED) injection 4 mg, 4 mg, Intravenous, Once, KurFlora LippsD .  multivitamin-lutein (OCUVITE-LUTEIN) capsule 1 capsule, 1 capsule, Oral, Daily, MarLenis NoonPH, 1 capsule at 05/15/15 1846 .   ondansetron (ZOFRAN) tablet 4 mg, 4 mg, Oral, Q6H PRN **OR** ondansetron (ZOFRAN) injection 4 mg, 4 mg, Intravenous, Q6H PRN, RadGladstone LighterD .  pantoprazole (PROTONIX) EC tablet 40 mg, 40 mg, Oral, Daily, RadGladstone LighterD, 40 mg at 05/16/15 0937 .  senna (SENOKOT) tablet 8.6 mg, 1 tablet, Oral, Daily PRN, RadGladstone LighterD .  sertraline (ZOLOFT) tablet 25 mg, 25 mg, Oral, Daily, RadGladstone LighterD, 25 mg at 05/16/15 0936 .  sodium chloride 0.9 % injection 3 mL, 3 mL, Intravenous, Q12H, RadGladstone LighterD, 3 mL at 05/16/15 0943 .  sterile water (preservative free) injection, , , ,  .  vecuronium (NORCURON) 10 MG injection, , , ,  .  vecuronium (NORCURON) injection 10 mg, 10 mg, Intravenous, Once, KurFlora LippsD .  vitamin C (ASCORBIC ACID) tablet 500 mg, 500 mg, Oral, Daily, RadGladstone LighterD, 500 mg at 05/16/15 0937  IMAGING    No results found.    Indwelling Urinary Catheter continued, requirement due to   Reason to continue Indwelling Urinary Catheter for strict Intake/Output monitoring for hemodynamic instability   Central Line continued, requirement due to   Reason to continue CenKinder Morgan Energynitoring of central venous pressure or other hemodynamic parameters   Ventilator continued, requirement due to, resp failure    Ventilator Sedation RASS 0 to -2   MAJOR EVENTS/TEST RESULTS: 9/18>>intubated 9/18 central line 9/18 cxr c/w b/l iniltrates   INDWELLING DEVICES:: Left IJ CVL>>9/18  MICRO DATA: MRSA PCR  Urine  Blood Resp   ANTIMICROBIALS:     ASSESSMENT/PLAN   81 yo white female admitted for acute resp failure with acute encephalopathy(hyperpcanic and hypoxic resp failure) at this time probable acute CHF as she had frothy secretions in oral airway when intubated   PULMONARY -Respiratory Failure -continue Full MV support -continue Bronchodilator Therapy -Wean Fio2 and PEEP as tolerated   CARDIOVASCULAR Needs ICU  monitoring -likely CHF exacerbation-will need echo  RENAL -follow up chem 7 -foley catheter needed   GASTROINTESTINAL -will keep NPO -will place OG to suction -protonix for Gi prophylaxis  HEMATOLOGIC Follow cbc -on lovenox for DVT prophylaxis  INFECTIOUS -no infection at this point  ENDOCRINE - ICU hypoglycemic\Hyperglycemia protocol   NEUROLOGIC - intubated and sedated - minimal sedation to achieve a RASS goal: -1     I have personally obtained a history, examined the patient, evaluated laboratory and independently reviewed  imaging results, formulated the assessment and plan and placed orders.  The Patient requires high complexity decision making for assessment and support, frequent evaluation and titration of therapies, application of advanced monitoring technologies and extensive interpretation of multiple databases. Critical Care Time devoted to patient care services described in this note is 55  minutes.   Overall, patient is critically ill, prognosis is guarded. Patient at high risk for cardiac arrest and death.    Corrin Parker, M.D.  [facility]  Medical Director [place] Director [facility] Cardio-Pulmonary Department

## 2015-05-16 NOTE — Progress Notes (Signed)
Physical Therapy Treatment Patient Details Name: Regina Burke MRN: [id number] DOB: Aug 31, 1932 Today's Date: 05/16/2015    History of Present Illness Pt was here last week after a fall, went home and did well for a few day and then had breathing and heart issues    PT Comments    Pt's biggest issues is her activity tolerance/endurance.  She becomes fatigued with ~60 ft of ambulation and has a significant drop in O2 stats (high 80s on 3 liters).  She does not have balance or other safety issues while using the walker, but at baseline she uses only a cane or no AD.  Pt does well but would benefit from HHPT on d/c.   Follow Up Recommendations  Home health PT     Equipment Recommendations       Recommendations for Other Services       Precautions / Restrictions Precautions Precautions: Fall Restrictions Weight Bearing Restrictions: No    Mobility  Bed Mobility Overal bed mobility: Independent             General bed mobility comments: Pt does not need hand rails to get to EOB  Transfers Overall transfer level: Independent Equipment used: Rolling walker (2 wheeled)             General transfer comment: Pt is able to rise w/o direct assist, she does use the walker to maintain balance initially  Ambulation/Gait Ambulation/Gait assistance: Supervision Ambulation Distance (Feet): 60 Feet Assistive device: Rolling walker (2 wheeled)       General Gait Details: Pt is reliant on the walker and doesn't feel nearly as confident as she does at baseline (w/o AD).  She also fatigued quickly on 3 liters O2 with sats dropping from high 90s to high 80s with the effort.     Stairs            Wheelchair Mobility    Modified Rankin (Stroke Patients Only)       Balance                                    Cognition Arousal/Alertness: Awake/alert Behavior During Therapy: WFL for tasks assessed/performed Overall Cognitive Status: Within Functional  Limits for tasks assessed                      Exercises      General Comments        Pertinent Vitals/Pain Pain Assessment: No/denies pain    Home Living Family/patient expects to be discharged to:: Private residence Living Arrangements: Children Available Help at Discharge: Family Type of Home: House Home Access: Stairs to enter            Prior Function Level of Independence: Independent with assistive device(s)          PT Goals (current goals can now be found in the care plan section) Acute Rehab PT Goals PT Goal Formulation: With patient/family Time For Goal Achievement: 05/28/15 Potential to Achieve Goals: Good    Frequency  Min 2X/week    PT Plan      Co-evaluation             End of Session Equipment Utilized During Treatment: Gait belt Activity Tolerance: Patient limited by fatigue;Patient tolerated treatment well Patient left: with family/visitor present;in bed     Time: 1539-1601 PT Time Calculation (min) (ACUTE ONLY): 22 min  Charges:  G Codes:     Wayne Both, PT, DPT [phone]  Kreg Shropshire 05/16/2015, 5:07 PM

## 2015-05-16 NOTE — Progress Notes (Signed)
Anticoagulation monitoring(Lovenox):  81yo F ordered Lovenox 40 mg Q24h  Filed Weights   05/15/15 1150  Weight: 165 lb (74.844 kg)  Body mass index is 28.31 kg/(m^2).  Lab Results  Component Value Date   CREATININE 1.79* 05/16/2015   CREATININE 1.13* 05/15/2015   CREATININE 1.05* 05/11/2015   Estimated Creatinine Clearance: 25.2 mL/min (by C-G formula based on Cr of 1.79).   Hemoglobin & Hematocrit     Component Value Date/Time   HGB 8.5* 05/16/2015 0514   HGB 11.7* 12/09/2014 1325   HCT 25.6* 05/16/2015 0514   HCT 36.1 12/09/2014 1325     Per Protocol for Patient with estCrcl< 30 ml/min and BMI < 40, will transition to Lovenox 30 mg Q24h.     Chinita Greenland PharmD Clinical Pharmacist 05/16/2015

## 2015-05-16 NOTE — Procedures (Signed)
Central Venous Catheter Placement: Indication: Patient receiving vesicant or irritant drug.; Patient receiving intravenous therapy for longer than 5 days.; Patient has limited or no vascular access.   Consent:emergent  Risks and benefits explained in detail including risk of infection, bleeding, respiratory failure and death..   Hand washing performed prior to starting the procedure.   Procedure: An active timeout was performed and correct patient, name, & ID confirmed.  After explaining risk and benefits, patient was positioned correctly for central venous access. Patient was prepped using strict sterile technique including chlorohexadine preps, sterile drape, sterile gown and sterile gloves.  The area was prepped, draped and anesthetized in the usual sterile manner. Patient comfort was obtained.  A triple lumen catheter was placed in LT Internal Jugular Vein There was good blood return, catheter caps were placed on lumens, catheter flushed easily, the line was secured and a sterile dressing and BIO-PATCH applied.   Ultrasound was used to visualize vasculature and guidance of needle.   Number of Attempts: 1 Complications:none Estimated Blood Loss: none Chest Radiograph indicated and ordered.  Procedure time:10 mins Operator: Caden Fatica.   Corrin Parker, M.D.  [facility]  Medical Director [place] Director [facility] Cardio-Pulmonary Department

## 2015-05-16 NOTE — Progress Notes (Signed)
Patient ID: ARMILDA VANDERLINDEN, female   DOB: 1933/01/26, 81 y.o.   MRN: [id number]  Iisha Soyars is a 81 y.o. female just discharged 4 days ago re-admitted with acute on chronic respiratory failure and rapid AF. Much improved since admission.   ______________________________________________________________________  ROS: Review of systems is unremarkable for any active cardiac, GI, GU, hematologic, neurologic or psychiatric systems, 10 systems reviewed.  Marland Kitchen aspirin EC  81 mg Oral Daily  . atorvastatin  10 mg Oral Daily  . azithromycin  250 mg Oral Daily  . cholecalciferol  400 Units Oral Daily  . clopidogrel  75 mg Oral Daily  . vitamin B-12  500 mcg Oral Daily  . digoxin  0.125 mg Oral Daily  . diltiazem  180 mg Oral Daily  . enoxaparin (LOVENOX) injection  30 mg Subcutaneous Q24H  . febuxostat  40 mg Oral Daily  . fenofibrate  54 mg Oral Daily  . ferrous sulfate  325 mg Oral Daily  . metFORMIN  500 mg Oral BID WC  . methylPREDNISolone (SOLU-MEDROL) injection  60 mg Intravenous 3 times per day  . metoprolol succinate  100 mg Oral Daily  . multivitamin-lutein  1 capsule Oral Daily  . pantoprazole  40 mg Oral Daily  . sertraline  25 mg Oral Daily  . sodium chloride  3 mL Intravenous Q12H  . vitamin C  500 mg Oral Daily   acetaminophen **OR** acetaminophen, docusate sodium, ipratropium-albuterol, metoprolol, ondansetron **OR** ondansetron (ZOFRAN) IV, senna   Past Medical History  Diagnosis Date  . COPD (chronic obstructive pulmonary disease)   . CHF (congestive heart failure)   . Hypertension   . Rapid palpitations 2014    Seen at [facility], may have been atrial fibrillation  . DOE (dyspnea on exertion)     Started after treatment for lung cancer  . High cholesterol   . Heart murmur   . Myocardial infarction 12/22/2014   . Lung cancer dx'd 2014    S/P radiation 2015  . Sleep apnea   . Cushing's disease   . Type II diabetes mellitus   . Anemia   . GERD (gastroesophageal reflux  disease)   . History of hiatal hernia   . Migraine   . Arthritis   . Gout   . Depression   . Chronic kidney disease (CKD), stage III (moderate)     Past Surgical History  Procedure Laterality Date  . Adrenalectomy Left 1980's    "Cushings"  . Abdominal hysterectomy    . Appendectomy    . Cholecystectomy    . Coronary angioplasty with stent placement  12/23/2014  . Tonsillectomy    . Fracture surgery    . Wrist fracture surgery Bilateral ~ 2000  . Breast cyst excision Left   . Tubal ligation    . Left heart catheterization with coronary angiogram N/A 12/23/2014    Procedure: LEFT HEART CATHETERIZATION WITH CORONARY ANGIOGRAM;  Surgeon: Burnell Blanks, MD;  Location: [facility] CATH LAB;  Service: Cardiovascular;  Laterality: N/A;  . Percutaneous coronary stent intervention (pci-s)  12/23/2014    Procedure: PERCUTANEOUS CORONARY STENT INTERVENTION (PCI-S);  Surgeon: Burnell Blanks, MD;  Location: [facility] CATH LAB;  Service: Cardiovascular;;  Promus 2.25x8    PHYSICAL EXAM:  BP 120/55 mmHg  Pulse 133  Temp(Src) 97.7 F (36.5 C) (Oral)  Resp 26  Ht '5\' 4"'$  (1.626 m)  Wt 74.844 kg (165 lb)  BMI 28.31 kg/m2  SpO2 92%  Wt Readings from Last 3  Encounters:  05/15/15 74.844 kg (165 lb)  05/08/15 68.04 kg (150 lb)  04/14/15 68.04 kg (150 lb)           BP Readings from Last 3 Encounters:  05/16/15 120/55  05/11/15 151/78  04/15/15 154/54    Constitutional: NAD Neck: supple, no thyromegaly Respiratory: CTA, no rales or wheezes Cardiovascular: RRR, no murmur, no gallop Abdomen: soft, good BS, nontender Extremities: no edema Neuro: alert and oriented, no focal motor or sensory deficits  ASSESSMENT/PLAN:  Labs and imaging studies were reviewed  #1 acute on chronic COPD exacerbation- -improved on current pulmonary toilet.  #2 atrial fibrillation with rapid ventricular response-likely worsened from respiratory issues. -currently in NSR Recent echo with the normal  ejection fraction, diastolic dysfunction noted.  #3 hypertension-continue home medications metoprolol and Cardizem.  #4 diabetes mellitus-patient is on metformin at this time.  #5 hyperkalemia-recent admission with hyperkalemia as well. Her valsartan has been stopped at the time. No other source noted at this time. Continue to monitor.  #6 CKD stage III-stable at this time.  #7 Drop in Hgb-repeat in AM.     #7 DVT prophylaxis-Lovenox

## 2015-05-17 ENCOUNTER — Inpatient Hospital Stay
Admit: 2015-05-17 | Discharge: 2015-05-17 | Disposition: A | Discharge status: Home / Self Care | Payer: Medicare Other | Attending: Internal Medicine | Admitting: Internal Medicine

## 2015-05-17 ENCOUNTER — Inpatient Hospital Stay: Payer: Medicare Other

## 2015-05-17 LAB — CBC WITH DIFFERENTIAL/PLATELET
BASOS ABS: 0 10*3/uL (ref 0–0.1)
BASOS PCT: 0 %
EOS ABS: 0 10*3/uL (ref 0–0.7)
EOS PCT: 0 %
HCT: 26.5 % — ABNORMAL LOW (ref 35.0–47.0)
Hemoglobin: 8.5 g/dL — ABNORMAL LOW (ref 12.0–16.0)
Lymphocytes Relative: 2 %
Lymphs Abs: 0.3 10*3/uL — ABNORMAL LOW (ref 1.0–3.6)
MCH: 25.6 pg — ABNORMAL LOW (ref 26.0–34.0)
MCHC: 32.3 g/dL (ref 32.0–36.0)
MCV: 79.2 fL — ABNORMAL LOW (ref 80.0–100.0)
MONO ABS: 0.2 10*3/uL (ref 0.2–0.9)
Monocytes Relative: 1 %
Neutro Abs: 16.8 10*3/uL — ABNORMAL HIGH (ref 1.4–6.5)
Neutrophils Relative %: 97 %
PLATELETS: 230 10*3/uL (ref 150–440)
RBC: 3.35 MIL/uL — AB (ref 3.80–5.20)
RDW: 16.4 % — AB (ref 11.5–14.5)
WBC: 17.3 10*3/uL — AB (ref 3.6–11.0)

## 2015-05-17 LAB — BLOOD GAS, ARTERIAL
ACID-BASE EXCESS: 3.6 mmol/L — AB (ref 0.0–3.0)
ALLENS TEST (PASS/FAIL): POSITIVE — AB
ALLENS TEST (PASS/FAIL): POSITIVE — AB
Acid-Base Excess: 1.2 mmol/L (ref 0.0–3.0)
BICARBONATE: 29.6 meq/L — AB (ref 21.0–28.0)
BICARBONATE: 31.1 meq/L — AB (ref 21.0–28.0)
FIO2: 35
FIO2: 0.35
Mode: POSITIVE
O2 SAT: 91.4 %
O2 SAT: 91.2 %
PEEP/CPAP: 5 cmH2O
PEEP: 5 cmH2O
PO2 ART: 70 mmHg — AB (ref 83.0–108.0)
PO2 ART: 66 mmHg — AB (ref 83.0–108.0)
PRESSURE SUPPORT: 5 cmH2O
Patient temperature: 37
Patient temperature: 37
Pressure support: 5 cmH2O
pCO2 arterial: 63 mmHg — ABNORMAL HIGH (ref 32.0–48.0)
pCO2 arterial: 59 mmHg — ABNORMAL HIGH (ref 32.0–48.0)
pH, Arterial: 7.28 — ABNORMAL LOW (ref 7.350–7.450)
pH, Arterial: 7.33 — ABNORMAL LOW (ref 7.350–7.450)

## 2015-05-17 LAB — COMPREHENSIVE METABOLIC PANEL
ALT: 15 U/L (ref 14–54)
AST: 29 U/L (ref 15–41)
Albumin: 3.5 g/dL (ref 3.5–5.0)
Alkaline Phosphatase: 39 U/L (ref 38–126)
Anion gap: 10 (ref 5–15)
BILIRUBIN TOTAL: 0.7 mg/dL (ref 0.3–1.2)
BUN: 71 mg/dL — AB (ref 6–20)
CALCIUM: 9.1 mg/dL (ref 8.9–10.3)
CO2: 27 mmol/L (ref 22–32)
CREATININE: 1.87 mg/dL — AB (ref 0.44–1.00)
Chloride: 100 mmol/L — ABNORMAL LOW (ref 101–111)
GFR calc Af Amer: 28 mL/min — ABNORMAL LOW (ref >60)
GFR, EST NON AFRICAN AMERICAN: 24 mL/min — AB (ref >60)
GLUCOSE: 204 mg/dL — AB (ref 65–99)
POTASSIUM: 5 mmol/L (ref 3.5–5.1)
Sodium: 137 mmol/L (ref 135–145)
TOTAL PROTEIN: 6 g/dL — AB (ref 6.5–8.1)

## 2015-05-17 LAB — BASIC METABOLIC PANEL
ANION GAP: 9 (ref 5–15)
BUN: 64 mg/dL — ABNORMAL HIGH (ref 6–20)
CHLORIDE: 100 mmol/L — AB (ref 101–111)
CO2: 26 mmol/L (ref 22–32)
CREATININE: 1.85 mg/dL — AB (ref 0.44–1.00)
Calcium: 8.2 mg/dL — ABNORMAL LOW (ref 8.9–10.3)
GFR calc non Af Amer: 25 mL/min — ABNORMAL LOW (ref >60)
GFR, EST AFRICAN AMERICAN: 29 mL/min — AB (ref >60)
Glucose, Bld: 274 mg/dL — ABNORMAL HIGH (ref 65–99)
POTASSIUM: 5.8 mmol/L — AB (ref 3.5–5.1)
SODIUM: 135 mmol/L (ref 135–145)

## 2015-05-17 LAB — EXPECTORATED SPUTUM ASSESSMENT W GRAM STAIN, RFLX TO RESP C

## 2015-05-17 LAB — GLUCOSE, CAPILLARY
GLUCOSE-CAPILLARY: 210 mg/dL — AB (ref 65–99)
GLUCOSE-CAPILLARY: 191 mg/dL — AB (ref 65–99)
Glucose-Capillary: 141 mg/dL — ABNORMAL HIGH (ref 65–99)
Glucose-Capillary: 203 mg/dL — ABNORMAL HIGH (ref 65–99)

## 2015-05-17 LAB — EXPECTORATED SPUTUM ASSESSMENT W REFEX TO RESP CULTURE

## 2015-05-17 LAB — MRSA PCR SCREENING: MRSA BY PCR: NEGATIVE

## 2015-05-17 LAB — DIGOXIN LEVEL: DIGOXIN LVL: 1 ng/mL (ref 0.8–2.0)

## 2015-05-17 MED ORDER — ASPIRIN 81 MG PO CHEW
81.0000 mg | CHEWABLE_TABLET | Freq: Every day | ORAL | Status: DC
  Administered 2015-05-17 – 2015-05-24 (×8): 81 mg via ORAL
  Filled 2015-05-17 (×8): qty 1

## 2015-05-17 MED ORDER — DILTIAZEM HCL 25 MG/5ML IV SOLN
10.0000 mg | Freq: Once | INTRAVENOUS | Status: AC
  Administered 2015-05-17: 10 mg via INTRAVENOUS
  Filled 2015-05-17: qty 5

## 2015-05-17 MED ORDER — ALPRAZOLAM 0.25 MG PO TABS
0.2500 mg | ORAL_TABLET | Freq: Once | ORAL | Status: AC
  Administered 2015-05-17: 0.25 mg via ORAL
  Filled 2015-05-17: qty 1

## 2015-05-17 MED ORDER — ALPRAZOLAM 0.25 MG PO TABS
0.2500 mg | ORAL_TABLET | Freq: Three times a day (TID) | ORAL | Status: DC | PRN
  Administered 2015-05-18 – 2015-05-24 (×14): 0.25 mg via ORAL
  Filled 2015-05-17 (×16): qty 1

## 2015-05-17 MED ORDER — DEXTROSE 5 % IV SOLN
1.0000 g | INTRAVENOUS | Status: DC
  Administered 2015-05-17 – 2015-05-24 (×8): 1 g via INTRAVENOUS
  Filled 2015-05-17 (×10): qty 10

## 2015-05-17 MED ORDER — INSULIN REGULAR HUMAN 100 UNIT/ML IJ SOLN
10.0000 [IU] | Freq: Once | INTRAMUSCULAR | Status: AC
  Administered 2015-05-17: 10 [IU] via INTRAVENOUS
  Filled 2015-05-17: qty 0.1

## 2015-05-17 MED ORDER — DILTIAZEM HCL 60 MG PO TABS
60.0000 mg | ORAL_TABLET | Freq: Three times a day (TID) | ORAL | Status: DC
  Administered 2015-05-17 – 2015-05-24 (×22): 60 mg via ORAL
  Filled 2015-05-17 (×21): qty 1

## 2015-05-17 MED ORDER — PANTOPRAZOLE SODIUM 40 MG PO PACK
40.0000 mg | PACK | Freq: Every day | ORAL | Status: DC
  Administered 2015-05-17: 40 mg
  Filled 2015-05-17: qty 20

## 2015-05-17 MED ORDER — DEXTROSE 50 % IV SOLN
1.0000 | INTRAVENOUS | Status: AC
  Administered 2015-05-17: 50 mL via INTRAVENOUS
  Filled 2015-05-17: qty 50

## 2015-05-17 MED ORDER — INSULIN ASPART 100 UNIT/ML ~~LOC~~ SOLN
0.0000 [IU] | SUBCUTANEOUS | Status: DC
  Administered 2015-05-17: 2 [IU] via SUBCUTANEOUS
  Administered 2015-05-17 (×2): 8 [IU] via SUBCUTANEOUS
  Administered 2015-05-17 – 2015-05-18 (×3): 4 [IU] via SUBCUTANEOUS
  Administered 2015-05-18: 2 [IU] via SUBCUTANEOUS
  Administered 2015-05-18: 4 [IU] via SUBCUTANEOUS
  Administered 2015-05-18: 12 [IU] via SUBCUTANEOUS
  Administered 2015-05-19: 8 [IU] via SUBCUTANEOUS
  Administered 2015-05-19 (×3): 4 [IU] via SUBCUTANEOUS
  Administered 2015-05-19: 2 [IU] via SUBCUTANEOUS
  Administered 2015-05-19: 8 [IU] via SUBCUTANEOUS
  Administered 2015-05-20: 2 [IU] via SUBCUTANEOUS
  Administered 2015-05-20 (×2): 8 [IU] via SUBCUTANEOUS
  Administered 2015-05-20 (×2): 2 [IU] via SUBCUTANEOUS
  Administered 2015-05-20: 4 [IU] via SUBCUTANEOUS
  Administered 2015-05-21: 2 [IU] via SUBCUTANEOUS
  Administered 2015-05-21 (×2): 12 [IU] via SUBCUTANEOUS
  Administered 2015-05-21: 8 [IU] via SUBCUTANEOUS
  Administered 2015-05-22: 12 [IU] via SUBCUTANEOUS
  Administered 2015-05-22: 8 [IU] via SUBCUTANEOUS
  Administered 2015-05-22: 12 [IU] via SUBCUTANEOUS
  Administered 2015-05-23: 2 [IU] via SUBCUTANEOUS
  Administered 2015-05-23: 16 [IU] via SUBCUTANEOUS
  Administered 2015-05-23: 8 [IU] via SUBCUTANEOUS
  Administered 2015-05-23: 4 [IU] via SUBCUTANEOUS
  Administered 2015-05-24: 2 [IU] via SUBCUTANEOUS
  Administered 2015-05-24: 4 [IU] via SUBCUTANEOUS
  Filled 2015-05-17 (×2): qty 2
  Filled 2015-05-17: qty 12
  Filled 2015-05-17: qty 2
  Filled 2015-05-17: qty 4
  Filled 2015-05-17: qty 12
  Filled 2015-05-17: qty 4
  Filled 2015-05-17: qty 8
  Filled 2015-05-17: qty 2
  Filled 2015-05-17: qty 4
  Filled 2015-05-17: qty 2
  Filled 2015-05-17: qty 4
  Filled 2015-05-17: qty 12
  Filled 2015-05-17 (×2): qty 4
  Filled 2015-05-17: qty 16
  Filled 2015-05-17: qty 8
  Filled 2015-05-17: qty 4
  Filled 2015-05-17 (×2): qty 8
  Filled 2015-05-17: qty 12
  Filled 2015-05-17: qty 8
  Filled 2015-05-17: qty 2
  Filled 2015-05-17: qty 8
  Filled 2015-05-17 (×2): qty 4
  Filled 2015-05-17: qty 8
  Filled 2015-05-17: qty 12
  Filled 2015-05-17: qty 4
  Filled 2015-05-17 (×4): qty 2
  Filled 2015-05-17: qty 8

## 2015-05-17 MED ORDER — CALCIUM CHLORIDE 10 % IV SOLN
1.0000 g | Freq: Once | INTRAVENOUS | Status: AC
Start: 2015-05-17 — End: 2015-05-17
  Administered 2015-05-17: 1 g via INTRAVENOUS
  Filled 2015-05-17: qty 10

## 2015-05-17 MED ORDER — SODIUM POLYSTYRENE SULFONATE 15 GM/60ML PO SUSP
30.0000 g | Freq: Once | ORAL | Status: AC
  Administered 2015-05-17: 30 g via ORAL
  Filled 2015-05-17: qty 120

## 2015-05-17 NOTE — Progress Notes (Signed)
Patient ID: Regina Burke, female   DOB: 02-Jun-1933, 81 y.o.   MRN: [id number]  Regina Burke is a 81 y.o. female discharged recently after a fall- re-admitted with acute on chronic respiratory failure and rapid AF.  Transferred back to CCU after she was found unresponsive by family. Pt is currently intubated. CXR suggestive of Rt lung Pneumonia    ______________________________________________________________________  ROS: Review of systems is unremarkable for any active cardiac, GI, GU, hematologic, neurologic or psychiatric systems, 10 systems reviewed.  Marland Kitchen aspirin EC  81 mg Oral Daily  . atorvastatin  10 mg Oral Daily  . azithromycin  250 mg Oral Daily  . cholecalciferol  400 Units Oral Daily  . clopidogrel  75 mg Oral Daily  . vitamin B-12  500 mcg Oral Daily  . diltiazem  180 mg Oral Daily  . enoxaparin (LOVENOX) injection  30 mg Subcutaneous Q24H  . febuxostat  40 mg Oral Daily  . fenofibrate  54 mg Oral Daily  . ferrous sulfate  325 mg Oral Daily  . insulin aspart  0-24 Units Subcutaneous 6 times per day  . methylPREDNISolone (SOLU-MEDROL) injection  60 mg Intravenous 3 times per day  . metoprolol succinate  100 mg Oral Daily  . midazolam  4 mg Intravenous Once  . multivitamin-lutein  1 capsule Oral Daily  . pantoprazole  40 mg Oral Daily  . sertraline  25 mg Oral Daily  . sodium chloride  3 mL Intravenous Q12H  . vitamin C  500 mg Oral Daily   acetaminophen **OR** acetaminophen, docusate sodium, ipratropium-albuterol, metoprolol, ondansetron **OR** ondansetron (ZOFRAN) IV, senna   Past Medical History  Diagnosis Date  . COPD (chronic obstructive pulmonary disease)   . CHF (congestive heart failure)   . Hypertension   . Rapid palpitations 2014    Seen at [facility], may have been atrial fibrillation  . DOE (dyspnea on exertion)     Started after treatment for lung cancer  . High cholesterol   . Heart murmur   . Myocardial infarction 12/22/2014   . Lung cancer dx'd  2014    S/P radiation 2015  . Sleep apnea   . Cushing's disease   . Type II diabetes mellitus   . Anemia   . GERD (gastroesophageal reflux disease)   . History of hiatal hernia   . Migraine   . Arthritis   . Gout   . Depression   . Chronic kidney disease (CKD), stage III (moderate)     Past Surgical History  Procedure Laterality Date  . Adrenalectomy Left 1980's    "Cushings"  . Abdominal hysterectomy    . Appendectomy    . Cholecystectomy    . Coronary angioplasty with stent placement  12/23/2014  . Tonsillectomy    . Fracture surgery    . Wrist fracture surgery Bilateral ~ 2000  . Breast cyst excision Left   . Tubal ligation    . Left heart catheterization with coronary angiogram N/A 12/23/2014    Procedure: LEFT HEART CATHETERIZATION WITH CORONARY ANGIOGRAM;  Surgeon: Burnell Blanks, MD;  Location: [facility] CATH LAB;  Service: Cardiovascular;  Laterality: N/A;  . Percutaneous coronary stent intervention (pci-s)  12/23/2014    Procedure: PERCUTANEOUS CORONARY STENT INTERVENTION (PCI-S);  Surgeon: Burnell Blanks, MD;  Location: [facility] CATH LAB;  Service: Cardiovascular;;  Promus 2.25x8    PHYSICAL EXAM:  BP 120/50 mmHg  Pulse 52  Temp(Src) 97.9 F (36.6 C) (Axillary)  Resp 18  Ht 5'  4" (1.626 m)  Wt 74.571 kg (164 lb 6.4 oz)  BMI 28.21 kg/m2  SpO2 98%  Wt Readings from Last 3 Encounters:  05/16/15 74.571 kg (164 lb 6.4 oz)  05/08/15 68.04 kg (150 lb)  04/14/15 68.04 kg (150 lb)           BP Readings from Last 3 Encounters:  05/17/15 120/50  05/11/15 151/78  04/15/15 154/54    Constitutional:On vent  Neck: supple, no thyromegaly Respiratory: Rhonchi + bilat  Cardiovascular:Iregular Rhythm-, no murmur, no gallop Abdomen: soft, good BS, nontender Extremities: no edema Neuro: alert and oriented, no focal motor or sensory deficits  ASSESSMENT/PLAN:  Labs and imaging studies were reviewed  1 Acute Respiratory failure;  Rt Lung Pneumonia-On  Vent. Continue IV Solumedrol, Antibiotics  and bronchodilators Pt is more awake and likely can be extubated later today Repeat CXR today  2  Atrial fibrillation with rapid ventricular response-likely worsened from respiratory issues. On Diltiazem and Metoprolol  Recent echo with the normal ejection fraction, diastolic dysfunction noted.  3 Hypertension-continue  metoprolol and Cardizem.  4 Diabetes mellitus-Start SSI  5 Hyperkalemia-recent admission with hyperkalemia as well. Her valsartan has been stopped - Se K 5,8- Continue to monitor   6 CKD stage III-stable at this time.  7 Anemia- Hgb 9.4- Recheck in am     DVT prophylaxis-Lovenox Discussed with family

## 2015-05-17 NOTE — Progress Notes (Signed)
Nutrition Follow-up     INTERVENTION:   Meals and Snacks: Cater to patient preferences,diet progression as medically feasible post extubation  NUTRITION DIAGNOSIS:   Inadequate oral intake related to acute illness as evidenced by NPO status.  GOAL:   Provide needs based on ASPEN/SCCM guidelines  MONITOR:    (Energy Intake, Anthropometrics, Digestive System, Electrolyte/Renal Profile, Pulmonary)  REASON FOR ASSESSMENT:   Ventilator    ASSESSMENT:    Pt admitted with acute on chronic respiratory failure and rapid afib, required intubation during the night, s/p extubation today.Pt with recent fall with facial fractures  Past Medical History  Diagnosis Date  . COPD (chronic obstructive pulmonary disease)   . CHF (congestive heart failure)   . Hypertension   . Rapid palpitations 2014    Seen at [facility], may have been atrial fibrillation  . DOE (dyspnea on exertion)     Started after treatment for lung cancer  . High cholesterol   . Heart murmur   . Myocardial infarction 12/22/2014   . Lung cancer dx'd 2014    S/P radiation 2015  . Sleep apnea   . Cushing's disease   . Type II diabetes mellitus   . Anemia   . GERD (gastroesophageal reflux disease)   . History of hiatal hernia   . Migraine   . Arthritis   . Gout   . Depression   . Chronic kidney disease (CKD), stage III (moderate)     Diet Order:  Diet clear liquid Room service appropriate?: Yes; Fluid consistency:: Thin   Energy Intake: diet just advanced post extubation  Food and Nutrition Related History: unable to assess  Skin:  Reviewed, no issues   Electrolyte and Renal Profile:  Recent Labs Lab 05/16/15 2155 05/17/15 0038 05/17/15 0814  BUN 61* 64* 71*  CREATININE 1.83* 1.85* 1.87*  NA 133* 135 137  K 7.0* 5.8* 5.0  MG 2.0  --   --   PHOS 8.1*  --   --    Glucose Profile:   Recent Labs  05/16/15 2032 05/17/15 0716 05/17/15 1158  GLUCAP 293* 210* 141*   Protein Profile:   Recent  Labs Lab 05/16/15 2155 05/17/15 0814  ALBUMIN 3.6  3.6 3.5   Meds: ss novolog, solumedrol, kayexalate  Nutrition Focused Physical Exam:  Unable to complete Nutrition-Focused physical exam at this time.   Height:   Ht Readings from Last 1 Encounters:  05/15/15 '5\' 4"'$  (1.626 m)    Weight:   Wt Readings from Last 1 Encounters:  05/16/15 164 lb 6.4 oz (74.571 kg)    Wt Readings from Last 10 Encounters:  05/16/15 164 lb 6.4 oz (74.571 kg)  05/08/15 150 lb (68.04 kg)  04/14/15 150 lb (68.04 kg)  04/14/15 151 lb (68.493 kg)  04/13/15 148 lb 11.2 oz (67.45 kg)  01/07/15 161 lb 2 oz (73.086 kg)  12/24/14 166 lb 3.6 oz (75.4 kg)    BMI:  Body mass index is 28.21 kg/(m^2).  Estimated Nutritional Needs:   Kcal:  4431-5400 kcals (BEE 1010, 1.3 AF, 1.1-1.3 IF) using IBW 55 kg  Protein:  61-72 g (1.1-1.3 g/kg)   Fluid:  1375-1650 mL (25-30 ml/kg)   MODERATE Care Level Kerman Passey MS, RD, LDN [phone] Pager

## 2015-05-17 NOTE — Progress Notes (Signed)
Pt. Was suctioned prior to extubation. She was extubated without incident and placed on a 3L nasal cannula. Sa02 was 94%. Extubation order was given by Dr. Oletta Lamas. She is voicing and coughing.

## 2015-05-17 NOTE — Progress Notes (Signed)
Inpatient Diabetes Program Recommendations  AACE/ADA: New Consensus Statement on Inpatient Glycemic Control (2015)  Target Ranges:  Prepandial:   less than 140 mg/dL      Peak postprandial:   less than 180 mg/dL (1-2 hours)      Critically ill patients:  140 - 180 mg/dL    Results for Regina Burke, Regina Burke (MRN [id number]) as of 05/17/2015 11:17  Ref. Range 05/16/2015 20:32 05/17/2015 07:16  Glucose-Capillary Latest Ref Range: 65-99 mg/dL 293 (H) 210 (H)    Admit with: Respiratory Failure/ A Fib  History: DM, CHF, COPD, CKD  Home DM Meds: Metformin 500 mg bid  Current DM Orders: Novolog SSI (0-24 units) Q4 hours    -Note patient currently intubated.  Per MD note, may consider extubation later today.  -Started Novolog SSI (0-24 units) Q4 hours this morning at 8am.  -Note patient also received 10 units Regular insulin IV + D50% at 1am for elevated potassium level.    Will follow Wyn Quaker RN, MSN, CDE Diabetes Coordinator Inpatient Glycemic Control Team Team Pager: [phone] (8a-5p)

## 2015-05-17 NOTE — Clinical Documentation Improvement (Signed)
Critical Care  Based on the clinical findings below, please document any associated diagnoses/conditions the patient has or may have.   Hypertensive heart disease  Other  Clinically Undetermined  Supporting Information:  Admitting BP 179/76 History of hypertension, CHF, Heart murmur, MI, Diabetes, and CKD 3 Echo-in progress Current diagnoses: COPD exacerbation, atrial fib w/rvr, hypertension, diabetes, hyperkalemia, Acute on Chronic CHF, and CKD III  9/19 PORTABLE CHEST - 1 VIEW  COMPARISON: None.  FINDINGS: Endotracheal tube, NG line, left IJ line in stable position. Cardiomegaly with pulmonary vascular prominence and bilateral interstitial prominence consistent with congestive heart failure. Interval partial clearing of pulmonary interstitial edema. Small bilateral pleural effusions IMPRESSION: 1. Lines and tubes in stable position. 2. Congestive heart failure with partial clearing of bilateral pulmonary interstitial edema. Small bilateral pleural effusions  Please exercise your independent, professional judgment when responding. A specific answer is not anticipated or expected.   Thank You, [place] [phone]

## 2015-05-17 NOTE — Progress Notes (Signed)
Patient received from Telemetry approximately  2030, in severe respiratory distress requiring assistance with ventilation to  maintain oxygenation.  Dr. Mortimer Fries at bedside, patient intubated and placed on ventilator, OG  And Central line placed.  Family made aware and update by MD.  Fentanyl gtt started and maintained at 12mg, patient remains able to communicate needs by writing. Foley catheter in place urine output insufficient.  Patient O2 decreased to 35% patient maintaining saturation. Daughter PKieth Brightlyremained at bedside overnight, further family updated by phone.  See CH[facility] for further assessment data, will continue to assess for changes/ patient need.

## 2015-05-17 NOTE — Progress Notes (Signed)
*  PRELIMINARY RESULTS* Echocardiogram 2D Echocardiogram has been performed.  Regina Burke 05/17/2015, 11:23 AM

## 2015-05-17 NOTE — Consult Note (Addendum)
[facility] Consultation      Name: Regina Burke MRN: [id number] DOB: Aug 15, 1933    ADMISSION DATE:  05/15/2015  81 yo white female admitted for acute resp failure with acute encephalopathy(hyperpcanic and hypoxic resp failure) at this time probable acute CHF as she had frothy secretions in oral airway when intubated   PULMONARY -Acute hypercapnic and hypoxic Respiratory Failure -Doing better today, much more awake, will wean vent and extubate if tolerated.  -continue Bronchodilator Therapy -Wean Fio2 and PEEP as tolerated  Addendum: Pt extubated, noted to have hypercapnea, will start bipap qhs and prn.    CARDIOVASCULAR Needs ICU monitoring -likely CHF exacerbation-will need echo  RENAL -Hyperkalemia, recheck this am, if still high will give kayexalate.  follow up chem 7 -foley catheter needed   GASTROINTESTINAL -HOld tube feeds for possible extubation -protonix for Gi prophylaxis  HEMATOLOGIC Follow cbc -on lovenox for DVT prophylaxis  INFECTIOUS -On abx, check sputum culture while intubated.  --Right sided infiltrate on CXR.   ENDOCRINE - ICU hypoglycemic\Hyperglycemia protocol   NEUROLOGIC - intubated and minimally sedated.  - minimal sedation to achieve a RASS goal: -1   CHIEF COMPLAINT:   Acute resp failure   HISTORY OF PRESENT ILLNESS  81 yo white female transferred to ICU for acute resp failure, patient unresponsive and using accessory muscles to breathe,  patient emergently intubated, placed on sedation, on full  vetn support  Patient recently admitted for s/p fall with  acute facial fractures Patient was in ICU last 24 for afib withRVR, patient supposedly was found unresponsive 2 hrs ago and was noted to have used the bathroom without any problems. Then family walked into patients room and was found unresponsive with agonal respirations  Patient critically ill, high risk for cardiac arrest/death   SIGNIFICANT EVENTS    06-10-2023 intubated  PAST MEDICAL HISTORY    :  Past Medical History  Diagnosis Date  . COPD (chronic obstructive pulmonary disease)   . CHF (congestive heart failure)   . Hypertension   . Rapid palpitations 2014    Seen at [facility], may have been atrial fibrillation  . DOE (dyspnea on exertion)     Started after treatment for lung cancer  . High cholesterol   . Heart murmur   . Myocardial infarction 12/22/2014   . Lung cancer dx'd 2014    S/P radiation 2015  . Sleep apnea   . Cushing's disease   . Type II diabetes mellitus   . Anemia   . GERD (gastroesophageal reflux disease)   . History of hiatal hernia   . Migraine   . Arthritis   . Gout   . Depression   . Chronic kidney disease (CKD), stage III (moderate)    Past Surgical History  Procedure Laterality Date  . Adrenalectomy Left 1980's    "Cushings"  . Abdominal hysterectomy    . Appendectomy    . Cholecystectomy    . Coronary angioplasty with stent placement  12/23/2014  . Tonsillectomy    . Fracture surgery    . Wrist fracture surgery Bilateral ~ 2000  . Breast cyst excision Left   . Tubal ligation    . Left heart catheterization with coronary angiogram N/A 12/23/2014    Procedure: LEFT HEART CATHETERIZATION WITH CORONARY ANGIOGRAM;  Surgeon: Burnell Blanks, MD;  Location: [facility] CATH LAB;  Service: Cardiovascular;  Laterality: N/A;  . Percutaneous coronary stent intervention (pci-s)  12/23/2014    Procedure: PERCUTANEOUS CORONARY STENT INTERVENTION (  PCI-S);  Surgeon: Burnell Blanks, MD;  Location: [facility] CATH LAB;  Service: Cardiovascular;;  Promus 2.25x8   Prior to Admission medications   Medication Sig Start Date End Date Taking? Authorizing Provider  ALPRAZolam (XANAX) 0.25 MG tablet Take 0.25 mg by mouth 2 (two) times daily. 05/12/15  Yes Historical Provider, MD  aspirin EC 81 MG tablet Take 1 tablet (81 mg total) by mouth daily. 04/13/15  Yes Troy Sine, MD  atorvastatin (LIPITOR) 10 MG tablet Take 10  mg by mouth daily.   Yes Historical Provider, MD  cholecalciferol (VITAMIN D) 400 UNITS TABS tablet Take 400 Units by mouth daily.   Yes Historical Provider, MD  clopidogrel (PLAVIX) 75 MG tablet Take 1 tablet (75 mg total) by mouth daily. 12/24/14  Yes Rhonda G Barrett, PA-C  Cyanocobalamin (VITAMIN B-12) 500 MCG SUBL Place 500 mcg under the tongue daily.   Yes Historical Provider, MD  digoxin (DIGITEK) 0.125 MG tablet Take 0.125 mg by mouth daily.   Yes Historical Provider, MD  diltiazem (DILACOR XR) 180 MG 24 hr capsule Take 180 mg by mouth daily.   Yes Historical Provider, MD  febuxostat (ULORIC) 40 MG tablet Take 40 mg by mouth daily.    Yes Historical Provider, MD  fenofibrate (TRICOR) 48 MG tablet Take 48 mg by mouth daily.   Yes Historical Provider, MD  ferrous sulfate 325 (65 FE) MG EC tablet Take 325 mg by mouth daily.    Yes Historical Provider, MD  ibuprofen (ADVIL,MOTRIN) 200 MG tablet Take 400 mg by mouth every 4 (four) hours as needed for headache, mild pain or moderate pain.   Yes Historical Provider, MD  metFORMIN (GLUCOPHAGE) 500 MG tablet Take 500 mg by mouth 2 (two) times daily with a meal.   Yes Historical Provider, MD  metoprolol succinate (TOPROL-XL) 100 MG 24 hr tablet Take 1 tablet (100 mg total) by mouth daily. Take with or immediately following a meal. 12/24/14  Yes Rhonda G Barrett, PA-C  Multiple Vitamins-Minerals (PRESERVISION AREDS 2) CAPS Take 1 capsule by mouth daily.   Yes Historical Provider, MD  naproxen sodium (ALEVE) 220 MG tablet Take 220 mg by mouth 2 (two) times daily with a meal.   Yes Historical Provider, MD  nitroGLYCERIN (NITROSTAT) 0.4 MG SL tablet Place 1 tablet (0.4 mg total) under the tongue every 5 (five) minutes as needed for chest pain. 12/24/14  Yes Rhonda G Barrett, PA-C  pantoprazole (PROTONIX) 40 MG tablet Take 40 mg by mouth daily.   Yes Historical Provider, MD  sertraline (ZOLOFT) 25 MG tablet Take 25 mg by mouth daily.   Yes Historical Provider,  MD  vitamin C (ASCORBIC ACID) 500 MG tablet Take 500 mg by mouth daily.   Yes Historical Provider, MD   Allergies  Allergen Reactions  . Ciprofloxacin Shortness Of Breath, Itching and Rash  . Doxycycline Shortness Of Breath, Itching and Rash  . Penicillins Shortness Of Breath, Itching and Rash  . Sulfa Antibiotics Shortness Of Breath, Itching and Rash  . Morphine And Related Itching     FAMILY HISTORY   Family History  Problem Relation Age of Onset  . Heart disease Mother   . Diabetes Mother   . Osteoarthritis Mother   . Hypertension Mother   . Heart disease Father   . Hypertension Father   . COPD Brother       SOCIAL HISTORY    reports that she quit smoking about 17 years ago. Her smoking use  included Cigarettes. She has a 45 pack-year smoking history. She has never used smokeless tobacco. She reports that she drinks alcohol. She reports that she does not use illicit drugs.  Review of Systems  Unable to perform ROS: critical illness      VITAL SIGNS    Temp:  [97.1 F (36.2 C)-98.3 F (36.8 C)] 97.9 F (36.6 C) (09/19 0700) Pulse Rate:  [52-133] 52 (09/19 0700) Resp:  [17-27] 18 (09/19 0700) BP: (107-180)/(44-78) 120/50 mmHg (09/19 0700) SpO2:  [84 %-100 %] 98 % (09/19 0700) FiO2 (%):  [35 %-50 %] 35 % (09/19 0738) Weight:  [74.571 kg (164 lb 6.4 oz)] 74.571 kg (164 lb 6.4 oz) (09/18 1643) HEMODYNAMICS:   VENTILATOR SETTINGS: Vent Mode:  [-] PRVC FiO2 (%):  [35 %-50 %] 35 % Set Rate:  [18 bmp] 18 bmp Vt Set:  [450 mL] 450 mL PEEP:  [5 cmH20] 5 cmH20 Plateau Pressure:  [24 cmH20] 24 cmH20 INTAKE / OUTPUT:  Intake/Output Summary (Last 24 hours) at 05/17/15 0800 Last data filed at 05/17/15 0730  Gross per 24 hour  Intake  49.33 ml  Output     30 ml  Net  19.33 ml       PHYSICAL EXAM   Physical Exam  Constitutional: She is oriented to person, place, and time. She appears well-developed and well-nourished. She appears distressed.  HENT:  Head:  Normocephalic and atraumatic.  Mouth/Throat: No oropharyngeal exudate.  Eyes: EOM are normal. Pupils are equal, round, and reactive to light. No scleral icterus.  Neck: Normal range of motion. Neck supple.  Cardiovascular: Normal rate, regular rhythm and normal heart sounds.   No murmur heard. Pulmonary/Chest: No stridor. She is in respiratory distress. She has wheezes. She has rales.  resp distress  Abdominal: Soft. Bowel sounds are normal. She exhibits no distension. There is no tenderness. There is no rebound.  Musculoskeletal: Normal range of motion. She exhibits no edema.  Neurological: She is alert and oriented to person, place, and time. She displays normal reflexes. Coordination normal.  gcs<8T  Skin: Skin is warm. No rash noted. She is diaphoretic.  Psychiatric: She has a normal mood and affect.       LABS   LABS:  CBC  Recent Labs Lab 05/15/15 1233 05/16/15 0514 05/16/15 2155  WBC 8.3 11.1* 29.4*  HGB 9.8* 8.5* 9.4*  HCT 29.6* 25.6* 29.7*  PLT 215 194 317   Coag's  Recent Labs Lab 05/16/15 2155  APTT <20*  INR 1.17   BMET  Recent Labs Lab 05/16/15 0514 05/16/15 2155 05/17/15 0038  NA 138 133* 135  K 5.3* 7.0* 5.8*  CL 103 98* 100*  CO2 '28 27 26  '$ BUN 50* 61* 64*  CREATININE 1.79* 1.83* 1.85*  GLUCOSE 321* 344* 274*   Electrolytes  Recent Labs Lab 05/16/15 0514 05/16/15 2155 05/17/15 0038  CALCIUM 8.5* 8.4* 8.2*  MG  --  2.0  --   PHOS  --  8.1*  --    Sepsis Markers  Recent Labs Lab 05/16/15 2155  LATICACIDVEN 1.7   ABG  Recent Labs Lab 05/16/15 2045 05/16/15 2346  PHART 7.06* 7.29*  PCO2ART 105* 56*  PO2ART 79* 93   Liver Enzymes  Recent Labs Lab 05/16/15 2155  AST 30  30  ALT 16  15  ALKPHOS 41  41  BILITOT 0.7  0.8  ALBUMIN 3.6  3.6   Cardiac Enzymes  Recent Labs Lab 05/15/15 1233  TROPONINI <0.03  Glucose  Recent Labs Lab 05/10/15 1123 05/10/15 1608 05/10/15 2111 05/11/15 0716  05/16/15 2032 05/17/15 0716  GLUCAP 136* 96 138* 96 293* 210*     Recent Results (from the past 240 hour(s))  MRSA PCR Screening     Status: None   Collection Time: 05/15/15  6:10 PM  Result Value Ref Range Status   MRSA by PCR NEGATIVE NEGATIVE Final    Comment:        The GeneXpert MRSA Assay (FDA approved for NASAL specimens only), is one component of a comprehensive MRSA colonization surveillance program. It is not intended to diagnose MRSA infection nor to guide or monitor treatment for MRSA infections.      Current facility-administered medications:  .  acetaminophen (TYLENOL) tablet 650 mg, 650 mg, Oral, Q6H PRN, 650 mg at 05/16/15 1636 **OR** acetaminophen (TYLENOL) suppository 650 mg, 650 mg, Rectal, Q6H PRN, Gladstone Lighter, MD .  aspirin EC tablet 81 mg, 81 mg, Oral, Daily, Gladstone Lighter, MD, 81 mg at 05/16/15 0937 .  atorvastatin (LIPITOR) tablet 10 mg, 10 mg, Oral, Daily, Gladstone Lighter, MD, 10 mg at 05/16/15 0936 .  [COMPLETED] azithromycin (ZITHROMAX) tablet 500 mg, 500 mg, Oral, Daily, 500 mg at 05/15/15 2154 **FOLLOWED BY** azithromycin (ZITHROMAX) tablet 250 mg, 250 mg, Oral, Daily, Gladstone Lighter, MD, 250 mg at 05/16/15 0936 .  cholecalciferol (VITAMIN D) tablet 400 Units, 400 Units, Oral, Daily, Gladstone Lighter, MD, 400 Units at 05/16/15 [phone] .  clopidogrel (PLAVIX) tablet 75 mg, 75 mg, Oral, Daily, Gladstone Lighter, MD, 75 mg at 05/16/15 0937 .  cyanocobalamin tablet 500 mcg, 500 mcg, Oral, Daily, Lenis Noon, RPH, 500 mcg at 05/16/15 8676 .  diltiazem (DILACOR XR) 24 hr capsule 180 mg, 180 mg, Oral, Daily, Gladstone Lighter, MD, 180 mg at 05/16/15 0942 .  docusate sodium (COLACE) capsule 100 mg, 100 mg, Oral, BID PRN, Gladstone Lighter, MD .  enoxaparin (LOVENOX) injection 30 mg, 30 mg, Subcutaneous, Q24H, Vishwanath Hande, MD, 30 mg at 05/17/15 0034 .  febuxostat (ULORIC) tablet 40 mg, 40 mg, Oral, Daily, Gladstone Lighter, MD, 40 mg at  05/16/15 0942 .  fenofibrate tablet 54 mg, 54 mg, Oral, Daily, Gladstone Lighter, MD, 54 mg at 05/16/15 0936 .  fentaNYL 2521mg in NS 2531m(1055mml) infusion-PREMIX, 25-400 mcg/hr, Intravenous, Continuous, KurFlora LippsD, Last Rate: 7.5 mL/hr at 05/17/15 0600, 75 mcg/hr at 05/17/15 0600 .  ferrous sulfate tablet 325 mg, 325 mg, Oral, Daily, RadGladstone LighterD, 325 mg at 05/16/15 0937 .  insulin aspart (novoLOG) injection 0-24 Units, 0-24 Units, Subcutaneous, 6 times per day, VisTracie HarrierD, 8 Units at 05/17/15 0744 .  ipratropium-albuterol (DUONEB) 0.5-2.5 (3) MG/3ML nebulizer solution 3 mL, 3 mL, Nebulization, Q4H PRN, RadGladstone LighterD, 3 mL at 05/16/15 2040 .  methylPREDNISolone sodium succinate (SOLU-MEDROL) 125 mg/2 mL injection 60 mg, 60 mg, Intravenous, 3 times per day, RadGladstone LighterD, 60 mg at 05/17/15 0603 .  metoprolol (LOPRESSOR) injection 5 mg, 5 mg, Intravenous, Q4H PRN, RadGladstone LighterD .  metoprolol succinate (TOPROL-XL) 24 hr tablet 100 mg, 100 mg, Oral, Daily, RadGladstone LighterD, 100 mg at 05/16/15 0936 .  midazolam (VERSED) injection 4 mg, 4 mg, Intravenous, Once, KurFlora LippsD, 2 mg at 05/16/15 2136 .  multivitamin-lutein (OCUVITE-LUTEIN) capsule 1 capsule, 1 capsule, Oral, Daily, MarLenis NoonPH, 1 capsule at 05/15/15 1846 .  norepinephrine (LEVOPHED) '4mg'$  in D5W 250m87memix infusion, 0-40 mcg/min, Intravenous, Titrated, KuriFlora Lipps, Stopped at  05/17/15 0017 .  ondansetron (ZOFRAN) tablet 4 mg, 4 mg, Oral, Q6H PRN **OR** ondansetron (ZOFRAN) injection 4 mg, 4 mg, Intravenous, Q6H PRN, Gladstone Lighter, MD .  pantoprazole (PROTONIX) EC tablet 40 mg, 40 mg, Oral, Daily, Gladstone Lighter, MD, 40 mg at 05/16/15 0937 .  senna (SENOKOT) tablet 8.6 mg, 1 tablet, Oral, Daily PRN, Gladstone Lighter, MD .  sertraline (ZOLOFT) tablet 25 mg, 25 mg, Oral, Daily, Gladstone Lighter, MD, 25 mg at 05/16/15 0936 .  sodium chloride 0.9 % injection 3 mL, 3  mL, Intravenous, Q12H, Gladstone Lighter, MD, 3 mL at 05/16/15 0943 .  vitamin C (ASCORBIC ACID) tablet 500 mg, 500 mg, Oral, Daily, Gladstone Lighter, MD, 500 mg at 05/16/15 8841  IMAGING    Dg Abd 1 View  05/16/2015   CLINICAL DATA:  Encounter for orogastric tube placement.  EXAM: ABDOMEN - 1 VIEW  COMPARISON:  None.  FINDINGS: Tip of the orogastric tube extends well below the left hemidiaphragm consistent with it being in the mid stomach.  There are surgical vascular clips in left upper quadrant.  Normal bowel gas pattern.  IMPRESSION: Orogastric tube tip projects within the mid stomach.   Electronically Signed   By: Lajean Manes M.D.   On: 05/16/2015 21:52   Dg Chest Port 1 View  05/16/2015   CLINICAL DATA:  Post intubation.  Central line placement.  EXAM: PORTABLE CHEST - 1 VIEW  COMPARISON:  05/15/2015  FINDINGS: Endotracheal tube placed with tip measuring 3.6 cm above the carina. Enteric tube tip is off the field of view but below the left hemidiaphragm. Left central venous catheter tip is over the low SVC region. No pneumothorax. Normal heart size and pulmonary vascularity. Some prominence of the central pulmonary arteries. Diffuse infiltration in the right lung increasing since previous study. This could be due to pneumonia or asymmetrical edema. Probable small bilateral pleural effusions and basilar atelectasis. Calcified and tortuous aorta. Surgical clips in the right upper chest and upper abdomen.  IMPRESSION: Appliances appear in satisfactory position. Increasing infiltration in the right lung suggesting pneumonia or asymmetric edema. Small bilateral pleural effusions with basilar atelectasis.   Electronically Signed   By: Lucienne Capers M.D.   On: 05/16/2015 21:48      Indwelling Urinary Catheter continued, requirement due to   Reason to continue Indwelling Urinary Catheter for strict Intake/Output monitoring for hemodynamic instability   Central Line continued, requirement due to    Reason to continue [employer] Monitoring of central venous pressure or other hemodynamic parameters   Ventilator continued, requirement due to, resp failure    Ventilator Sedation RASS 0 to -2   MAJOR EVENTS/TEST RESULTS: 9/18>>intubated 9/18 central line 9/18 cxr c/w b/l iniltrates   INDWELLING DEVICES:: Left IJ CVL>>9/18  MICRO DATA: MRSA PCR  Urine  Blood Resp   ANTIMICROBIALS:     Marda Stalker, M.D.  [facility]   Critical Care Attestation.  I have personally obtained a history, examined the patient, evaluated laboratory and imaging results, formulated the assessment and plan and placed orders. The Patient requires high complexity decision making for assessment and support, frequent evaluation and titration of therapies, application of advanced monitoring technologies and extensive interpretation of multiple databases. The patient has critical illness that could lead imminently to failure of 1 or more organ systems and requires the highest level of physician preparedness to intervene.  Critical Care Time devoted to patient care services described in this note is 34  minutes and is exclusive of time spent in procedures.

## 2015-05-17 NOTE — Progress Notes (Signed)
[facility] Progress Note Patient Name: Regina Burke DOB: 06-11-1933 MRN: [id number]   Date of Service  05/17/2015  HPI/Events of Note   Recent Labs Lab 05/10/15 0443 05/11/15 0445 05/15/15 1233 05/16/15 0514 05/16/15 2155  NA 140 140 140 138 133*  K 4.6 4.2 5.4* 5.3* 7.0*  CL 108 105 103 103 98*  CO2 '23 29 29 28 27  '$ GLUCOSE 107* 114* 119* 321* 344*  BUN 25* 20 34* 50* 61*  CREATININE 1.21* 1.05* 1.13* 1.79* 1.83*  CALCIUM 8.2* 8.4* 9.0 8.5* 8.4*  MG  --   --   --   --  2.0  PHOS  --   --   --   --  8.1*    Recent Labs Lab 05/16/15 2155  LATICACIDVEN 1.7     eICU Interventions  Hyperkalemia with AKI  PLAN - repeat bmet and ekg stat prior to below - insulin 10u/d50 - calcium chloride - kayexamate  - HOLD DIGOXN/CHeck Digoxin level      Intervention Category Major Interventions: Electrolyte abnormality - evaluation and management  RAMASWAMY,MURALI 05/17/2015, 12:39 AM

## 2015-05-17 NOTE — Care Management Important Message (Signed)
Important Message  Patient Details  Name: Regina Burke MRN: [id number] Date of Birth: May 31, 1933   Medicare Important Message Given:  Yes-second notification given    Darius Bump Allmond 05/17/2015, 2:23 PM

## 2015-05-17 NOTE — Care Management Note (Signed)
Case Management Note  Patient Details  Name: CHANETTA MOOSMAN MRN: [id number] Date of Birth: 06/28/1933  Subjective/Objective:  Patient extubated. Met with patient and her son at bedside. Patient alert and talkative. Son states someone is home with patient ATC (most of the time). She has began using a walker. Hx falls. Her chronic O2 is through [facility].  She is agreeable to home health services at time of discharge. Provided a list of home health providers. PCP is Dr. Ginette Pitman. RNCM will follow up.                 Action/Plan: Follow up on home care choice.    Expected Discharge Date:                  Expected Discharge Plan:  [place]  In-House Referral:     Discharge planning Services  CM Consult  Post Acute Care Choice:    Choice offered to:     DME Arranged:    DME Agency:     HH Arranged:    [place] Agency:     Status of Service:  In process, will continue to follow  Medicare Important Message Given:    Date Medicare IM Given:    Medicare IM give by:    Date Additional Medicare IM Given:    Additional Medicare Important Message give by:     If discussed at [place] of Stay Meetings, dates discussed:    Additional Comments:  Jolly Mango, RN 05/17/2015, 1:54 PM

## 2015-05-17 NOTE — Care Management Note (Signed)
Case Management Note  Patient Details  Name: Regina Burke MRN: [id number] Date of Birth: [birth year]  Subjective/Objective:    Admitted after being found unresponsive by her family. Agonal respirations, acute respiratory failure and PNA.  Currently intubated with possible extubation today per documentation. Recently at [facility] 09/10-09/13 following a fall with a nasal fracture. At that time patient refused home health at discharge. Patient lives at home with her daughter and son. She is O2 dependant and home bound . HX COPD and [place]. RNCM consult for chronic O2. Will follow and speak with patient and family further following extubation              Action/Plan: Follow up with patient and family.  Expected Discharge Date:                  Expected Discharge Plan:  [place]  In-House Referral:     Discharge planning Services  CM Consult  Post Acute Care Choice:    Choice offered to:     DME Arranged:    DME Agency:     HH Arranged:    [place] Agency:     Status of Service:  In process, will continue to follow  Medicare Important Message Given:    Date Medicare IM Given:    Medicare IM give by:    Date Additional Medicare IM Given:    Additional Medicare Important Message give by:     If discussed at [place] of Stay Meetings, dates discussed:    Additional Comments:  Jolly Mango, RN 05/17/2015, 9:10 AM

## 2015-05-17 NOTE — Progress Notes (Signed)
CRITICAL VALUE ALERT  Critical value received:  Potassium of 7.0  Date of notification:  05/17/2015 Time of notification:  2300  Critical value read back:Yes.   yes  Nurse who received alert:  Donnamae Jude RN  MD notified (1st page):  2300  Time of first page:  2300  MD notified (2nd page):n/a  Time of second page:n/a  Responding MD:  Warren Lacy on cal MD  Time MD responded:  2303

## 2015-05-17 NOTE — Progress Notes (Signed)
Extubated onto 3L [place], tolerated well, family at bedside

## 2015-05-18 LAB — CBC WITH DIFFERENTIAL/PLATELET
BASOS ABS: 0 10*3/uL (ref 0–0.1)
BASOS PCT: 0 %
EOS ABS: 0 10*3/uL (ref 0–0.7)
EOS PCT: 0 %
HCT: 25.8 % — ABNORMAL LOW (ref 35.0–47.0)
Hemoglobin: 8.4 g/dL — ABNORMAL LOW (ref 12.0–16.0)
Lymphocytes Relative: 2 %
Lymphs Abs: 0.2 10*3/uL — ABNORMAL LOW (ref 1.0–3.6)
MCH: 25.9 pg — ABNORMAL LOW (ref 26.0–34.0)
MCHC: 32.8 g/dL (ref 32.0–36.0)
MCV: 79 fL — ABNORMAL LOW (ref 80.0–100.0)
Monocytes Absolute: 0.1 10*3/uL — ABNORMAL LOW (ref 0.2–0.9)
Monocytes Relative: 1 %
NEUTROS PCT: 97 %
Neutro Abs: 11.6 10*3/uL — ABNORMAL HIGH (ref 1.4–6.5)
PLATELETS: 219 10*3/uL (ref 150–440)
RBC: 3.26 MIL/uL — AB (ref 3.80–5.20)
RDW: 16.6 % — ABNORMAL HIGH (ref 11.5–14.5)
WBC: 12 10*3/uL — AB (ref 3.6–11.0)

## 2015-05-18 LAB — BLOOD GAS, ARTERIAL
ACID-BASE EXCESS: 7.8 mmol/L — AB (ref 0.0–3.0)
Allens test (pass/fail): POSITIVE — AB
Bicarbonate: 34.2 mEq/L — ABNORMAL HIGH (ref 21.0–28.0)
DELIVERY SYSTEMS: POSITIVE
Expiratory PAP: 6
FIO2: 0.35
Inspiratory PAP: 12
Mechanical Rate: 10
O2 SAT: 98.3 %
PATIENT TEMPERATURE: 37
PCO2 ART: 54 mmHg — AB (ref 32.0–48.0)
PH ART: 7.41 (ref 7.350–7.450)
pO2, Arterial: 109 mmHg — ABNORMAL HIGH (ref 83.0–108.0)

## 2015-05-18 LAB — GLUCOSE, CAPILLARY
GLUCOSE-CAPILLARY: 147 mg/dL — AB (ref 65–99)
Glucose-Capillary: 164 mg/dL — ABNORMAL HIGH (ref 65–99)
Glucose-Capillary: 166 mg/dL — ABNORMAL HIGH (ref 65–99)
Glucose-Capillary: 180 mg/dL — ABNORMAL HIGH (ref 65–99)
Glucose-Capillary: 181 mg/dL — ABNORMAL HIGH (ref 65–99)
Glucose-Capillary: 251 mg/dL — ABNORMAL HIGH (ref 65–99)

## 2015-05-18 LAB — BASIC METABOLIC PANEL
ANION GAP: 8 (ref 5–15)
BUN: 72 mg/dL — AB (ref 6–20)
CO2: 31 mmol/L (ref 22–32)
Calcium: 8.8 mg/dL — ABNORMAL LOW (ref 8.9–10.3)
Chloride: 99 mmol/L — ABNORMAL LOW (ref 101–111)
Creatinine, Ser: 1.51 mg/dL — ABNORMAL HIGH (ref 0.44–1.00)
GFR, EST AFRICAN AMERICAN: 37 mL/min — AB (ref >60)
GFR, EST NON AFRICAN AMERICAN: 32 mL/min — AB (ref >60)
Glucose, Bld: 176 mg/dL — ABNORMAL HIGH (ref 65–99)
POTASSIUM: 4.6 mmol/L (ref 3.5–5.1)
SODIUM: 138 mmol/L (ref 135–145)

## 2015-05-18 MED ORDER — FAMOTIDINE 20 MG PO TABS
20.0000 mg | ORAL_TABLET | Freq: Every day | ORAL | Status: DC
  Administered 2015-05-18 – 2015-05-24 (×7): 20 mg via ORAL
  Filled 2015-05-18 (×7): qty 1

## 2015-05-18 MED ORDER — PNEUMOCOCCAL VAC POLYVALENT 25 MCG/0.5ML IJ INJ: 0.5000 mL | INJECTION | INTRAMUSCULAR | Status: DC

## 2015-05-18 NOTE — Progress Notes (Signed)
Patient ID: Regina Burke, female   DOB: Oct 09, 1932, 81 y.o.   MRN: [id number]  Regina Burke is a 81 y.o. female discharged recently after a fall- re-admitted with acute on chronic respiratory failure and rapid AF.  Pt extubated yesterday On 3 l n/c O2     ______________________________________________________________________  ROS: Review of systems is unremarkable for any active cardiac, GI, GU, hematologic, neurologic or psychiatric systems, 10 systems reviewed.  Marland Kitchen aspirin  81 mg Oral Daily  . atorvastatin  10 mg Oral Daily  . azithromycin  250 mg Oral Daily  . cefTRIAXone (ROCEPHIN)  IV  1 g Intravenous Q24H  . cholecalciferol  400 Units Oral Daily  . clopidogrel  75 mg Oral Daily  . vitamin B-12  500 mcg Oral Daily  . diltiazem  60 mg Oral 3 times per day  . enoxaparin (LOVENOX) injection  30 mg Subcutaneous Q24H  . febuxostat  40 mg Oral Daily  . fenofibrate  54 mg Oral Daily  . ferrous sulfate  325 mg Oral Daily  . insulin aspart  0-24 Units Subcutaneous 6 times per day  . methylPREDNISolone (SOLU-MEDROL) injection  60 mg Intravenous 3 times per day  . metoprolol succinate  100 mg Oral Daily  . multivitamin-lutein  1 capsule Oral Daily  . pantoprazole sodium  40 mg Per Tube Daily  . sertraline  25 mg Oral Daily  . vitamin C  500 mg Oral Daily   acetaminophen **OR** acetaminophen, ALPRAZolam, docusate sodium, ipratropium-albuterol, metoprolol, ondansetron **OR** ondansetron (ZOFRAN) IV, senna   Past Medical History  Diagnosis Date  . COPD (chronic obstructive pulmonary disease)   . CHF (congestive heart failure)   . Hypertension   . Rapid palpitations 2014    Seen at [facility], may have been atrial fibrillation  . DOE (dyspnea on exertion)     Started after treatment for lung cancer  . High cholesterol   . Heart murmur   . Myocardial infarction 12/22/2014   . Lung cancer dx'd 2014    S/P radiation 2015  . Sleep apnea   . Cushing's disease   . Type II diabetes  mellitus   . Anemia   . GERD (gastroesophageal reflux disease)   . History of hiatal hernia   . Migraine   . Arthritis   . Gout   . Depression   . Chronic kidney disease (CKD), stage III (moderate)     Past Surgical History  Procedure Laterality Date  . Adrenalectomy Left 1980's    "Cushings"  . Abdominal hysterectomy    . Appendectomy    . Cholecystectomy    . Coronary angioplasty with stent placement  12/23/2014  . Tonsillectomy    . Fracture surgery    . Wrist fracture surgery Bilateral ~ 2000  . Breast cyst excision Left   . Tubal ligation    . Left heart catheterization with coronary angiogram N/A 12/23/2014    Procedure: LEFT HEART CATHETERIZATION WITH CORONARY ANGIOGRAM;  Surgeon: Burnell Blanks, MD;  Location: [facility] CATH LAB;  Service: Cardiovascular;  Laterality: N/A;  . Percutaneous coronary stent intervention (pci-s)  12/23/2014    Procedure: PERCUTANEOUS CORONARY STENT INTERVENTION (PCI-S);  Surgeon: Burnell Blanks, MD;  Location: [facility] CATH LAB;  Service: Cardiovascular;;  Promus 2.25x8    PHYSICAL EXAM:  BP 126/65 mmHg  Pulse 69  Temp(Src) 97.8 F (36.6 C) (Axillary)  Resp 21  Ht '5\' 4"'$  (1.626 m)  Wt 74.571 kg (164 lb 6.4 oz)  BMI  28.21 kg/m2  SpO2 98%  Wt Readings from Last 3 Encounters:  05/16/15 74.571 kg (164 lb 6.4 oz)  05/08/15 68.04 kg (150 lb)  04/14/15 68.04 kg (150 lb)           BP Readings from Last 3 Encounters:  05/18/15 126/65  05/11/15 151/78  04/15/15 154/54    Constitutional:On vent  Neck: supple, no thyromegaly Ecchymosis noted on face and chin Respiratory: Rhonchi + bilat  Cardiovascular:Iregular Rhythm-, no murmur, no gallop Abdomen: soft, good BS, nontender Extremities: no edema Neuro: alert and oriented, no focal motor or sensory deficits  ASSESSMENT/PLAN:  Labs and imaging studies were reviewed  1 Acute Respiratory failure/  Diastolic CHF Possible Pneumonia Continue IV Solumedrol, Antibiotics  and  bronchodilators On 3 l N/c O2    2  Atrial fibrillation with rapid ventricular response-likely worsened from respiratory issues. On Diltiazem and Metoprolol  Recent echo with the normal ejection fraction, diastolic dysfunction noted.  3 Hypertension-continue  metoprolol and Cardizem.  4 Diabetes mellitus-On  SSI  5 Hyperkalemia-recent admission with hyperkalemia as well. Her valsartan has been stopped - Se K 4.6- Continue to monitor   6 CKD stage III-stable at this time.  7 Anemia- Hgb 8.4- Recheck in am     DVT prophylaxis-Lovenox Discussed with family

## 2015-05-18 NOTE — Progress Notes (Signed)
PT Cancellation Note  Patient Details Name: Regina Burke MRN: [id number] DOB: 1932-11-17   Cancelled Treatment:    Reason Eval/Treat Not Completed: Other (comment) (See PT note for further details) PT hold this date secondary to PT requiring new orders. Pt was on telemetry on the 9/18 when she was found unresponsive and in respiratory distress. She was transferred to the ICU where she was intubated. She was extubated on 9/19. Due to the change in medical status, please issue new PT orders when/if appropriate.    Janyth Contes 05/18/2015, 12:17 PM  Janyth Contes, SPT. [phone]

## 2015-05-18 NOTE — Progress Notes (Signed)
Patient resting intermittently overnight, daughter Kieth Brightly at bedside. On Bipap while asleep and maintaining o2 saturation on 3L while awake. No distress noted, discussed with family the possibilty of patient moving out to a regular floor. Patient seems agreeable, family cautious. Will continue to assess for changes/ patient need, see [facility] for further assessment.

## 2015-05-18 NOTE — Care Management Note (Signed)
Case Management Note  Patient Details  Name: Regina Burke MRN: [id number] Date of Birth: [birth year]  Subjective/Objective:                 Home health preference list provided to patient and family 05/17/15.  Follow up with patient to see if she has determined which agency she would like to establish services with.  Patient selects [facility].  Corene Cornea with [facility] notified.     Action/Plan: Patient will need order for home health appropriate services and face to face at time of discharge.   Expected Discharge Date:                  Expected Discharge Plan:  [place]  In-House Referral:     Discharge planning Services  CM Consult  Post Acute Care Choice:    Choice offered to:     DME Arranged:    DME Agency:     HH Arranged:    [place] Agency:     Status of Service:  In process, will continue to follow  Medicare Important Message Given:  Yes-second notification given Date Medicare IM Given:    Medicare IM give by:    Date Additional Medicare IM Given:    Additional Medicare Important Message give by:     If discussed at [place] of Stay Meetings, dates discussed:    Additional Comments:  Beverly Sessions, RN 05/18/2015, 1:13 PM

## 2015-05-18 NOTE — Progress Notes (Addendum)
[place] Progess Note    ASSESSMENT/PLAN   81 yo white female admitted for acute resp failure with acute encephalopathy(hyperpcanic and hypoxic resp failure) at this time probable acute CHF as she had frothy secretions in oral airway when intubated   PULMONARY -Acute hypercapnic and hypoxic Respiratory Failure -Doing better today, the patient was extubated yesterday and tolerated BiPAP overnight.  -continue Bronchodilator Therapy -Wean Fio2 and PEEP as tolerated -Patient notes she feels much better when using BiPAP. -We will wean down steroids.   CARDIOVASCULAR Needs ICU monitoring. Continue BiPAP tonight. -likely CHF exacerbation  RENAL  Renal function improved today.    GASTROINTESTINAL -protonix for Gi prophylaxis, will change to an H2 blocker.  HEMATOLOGIC Follow cbc -on lovenox for DVT prophylaxis  INFECTIOUS -On abx, check sputum culture while intubated.  --Right sided infiltrate on CXR.   ENDOCRINE - ICU hypoglycemic\Hyperglycemia protocol   NEUROLOGIC - intubated and minimally sedated.  - minimal sedation to achieve a RASS goal: -1         ANTIMICROBIALS:    ---------------------------------------   ----------------------------------------   Name: Regina Burke MRN: [id number] DOB: June 30, 1933    ADMISSION DATE:  05/15/2015    CHIEF COMPLAINT:  dyspnea  SUBJECTIVE:     Review of Systems:  Constitutional: Feels well. Cardiovascular: No chest pain.  Pulmonary: Denies dyspnea.   The remainder of systems were reviewed and were found to be negative other than what is documented in the HPI.    VITAL SIGNS: Temp:  [97.8 F (36.6 C)] 97.8 F (36.6 C) (09/19 1931) Pulse Rate:  [40-139] 69 (09/20 0412) Resp:  [11-24] 21 (09/20 0412) BP: (104-162)/(45-94) 126/65 mmHg (09/20 0200) SpO2:  [87 %-98 %] 98 % (09/20 0412) FiO2 (%):  [35 %] 35 % (09/19 1142) HEMODYNAMICS:   VENTILATOR SETTINGS: Vent  Mode:  [-] Spontaneous FiO2 (%):  [35 %] 35 % Set Rate:  [10 bmp] 10 bmp Vt Set:  [450 mL] 450 mL PEEP:  [5 cmH20] 5 cmH20 Pressure Support:  [5 cmH20] 5 cmH20 INTAKE / OUTPUT:  Intake/Output Summary (Last 24 hours) at 05/18/15 0832 Last data filed at 05/18/15 0500  Gross per 24 hour  Intake    230 ml  Output   1575 ml  Net  -1345 ml    PHYSICAL EXAMINATION: Physical Examination:   VS: BP 126/65 mmHg  Pulse 69  Temp(Src) 97.8 F (36.6 C) (Axillary)  Resp 21  Ht '5\' 4"'$  (1.626 m)  Wt 74.571 kg (164 lb 6.4 oz)  BMI 28.21 kg/m2  SpO2 98%  General Appearance: No distress  Neuro:without focal findings, mental status normal. HEENT: PERRLA, EOM intact. Pulmonary: normal breath sounds   CardiovascularNormal S1,S2.  No m/r/g.   Abdomen: Benign, Soft, non-tender. Renal:  No costovertebral tenderness  GU:  Not performed at this time. Endocrine: No evident thyromegaly. Skin:   warm, no rashes, no ecchymosis  Extremities: normal, no cyanosis, clubbing.   LABS:   LABORATORY PANEL:   CBC  Recent Labs Lab 05/18/15 0515  WBC 12.0*  HGB 8.4*  HCT 25.8*  PLT 219    Chemistries   Recent Labs Lab 05/16/15 2155  05/17/15 0814 05/18/15 0615  NA 133*  < > 137 138  K 7.0*  < > 5.0 4.6  CL 98*  < > 100* 99*  CO2 27  < > 27 31  GLUCOSE 344*  < > 204* 176*  BUN 61*  < > 71* 72*  CREATININE  1.83*  < > 1.87* 1.51*  CALCIUM 8.4*  < > 9.1 8.8*  MG 2.0  --   --   --   PHOS 8.1*  --   --   --   AST 30  30  --  29  --   ALT 16  15  --  15  --   ALKPHOS 41  41  --  39  --   BILITOT 0.7  0.8  --  0.7  --   < > = values in this interval not displayed.   Recent Labs Lab 05/17/15 1158 05/17/15 1558 05/17/15 1959 05/18/15 0010 05/18/15 0358 05/18/15 0733  GLUCAP 141* 191* 203* 181* 180* 164*    Recent Labs Lab 05/17/15 0910 05/17/15 1215 05/18/15 0427  PHART 7.28* 7.33* 7.41  PCO2ART 63* 59* 54*  PO2ART 70* 66* 109*    Recent Labs Lab 05/16/15 2155  05/17/15 0814  AST '30  30 29  '$ ALT '16  15 15  '$ ALKPHOS 41  41 39  BILITOT 0.7  0.8 0.7  ALBUMIN 3.6  3.6 3.5    Cardiac Enzymes  Recent Labs Lab 05/15/15 1233  TROPONINI <0.03    RADIOLOGY:  Dg Abd 1 View  05/16/2015   CLINICAL DATA:  Encounter for orogastric tube placement.  EXAM: ABDOMEN - 1 VIEW  COMPARISON:  None.  FINDINGS: Tip of the orogastric tube extends well below the left hemidiaphragm consistent with it being in the mid stomach.  There are surgical vascular clips in left upper quadrant.  Normal bowel gas pattern.  IMPRESSION: Orogastric tube tip projects within the mid stomach.   Electronically Signed   By: Lajean Manes M.D.   On: 05/16/2015 21:52   Dg Chest Port 1 View  05/17/2015   CLINICAL DATA:  Respiratory failure.  EXAM: PORTABLE CHEST - 1 VIEW  COMPARISON:  None.  FINDINGS: Endotracheal tube, NG line, left IJ line in stable position. Cardiomegaly with pulmonary vascular prominence and bilateral interstitial prominence consistent with congestive heart failure. Interval partial clearing of pulmonary interstitial edema. Small bilateral pleural effusions. Surgical clips right upper abdomen.  IMPRESSION: 1. Lines and tubes in stable position. 2. Congestive heart failure with partial clearing of bilateral pulmonary interstitial edema. Small bilateral pleural effusions   Electronically Signed   By: [place]   On: 05/17/2015 08:54   Dg Chest Port 1 View  05/16/2015   CLINICAL DATA:  Post intubation.  Central line placement.  EXAM: PORTABLE CHEST - 1 VIEW  COMPARISON:  05/15/2015  FINDINGS: Endotracheal tube placed with tip measuring 3.6 cm above the carina. Enteric tube tip is off the field of view but below the left hemidiaphragm. Left central venous catheter tip is over the low SVC region. No pneumothorax. Normal heart size and pulmonary vascularity. Some prominence of the central pulmonary arteries. Diffuse infiltration in the right lung increasing since previous  study. This could be due to pneumonia or asymmetrical edema. Probable small bilateral pleural effusions and basilar atelectasis. Calcified and tortuous aorta. Surgical clips in the right upper chest and upper abdomen.  IMPRESSION: Appliances appear in satisfactory position. Increasing infiltration in the right lung suggesting pneumonia or asymmetric edema. Small bilateral pleural effusions with basilar atelectasis.   Electronically Signed   By: Lucienne Capers M.D.   On: 05/16/2015 21:48       --Marda Stalker, MD.  Pager [phone] [facility] Office Number: [phone]  Patricia Pesa, M.D.  Vishal  Stevenson Clinch, M.D.  Merton Border, M.D   [place].  I have personally obtained a history, examined the patient, evaluated laboratory and imaging results, formulated the assessment and plan and placed orders. The Patient requires high complexity decision making for assessment and support, frequent evaluation and titration of therapies, application of advanced monitoring technologies and extensive interpretation of multiple databases. The patient has critical illness that could lead imminently to failure of 1 or more organ systems and requires the highest level of physician preparedness to intervene.  Critical Care Time devoted to patient care services described in this note is 56, [place] thousand and 15 years, moved here minutes and is exclusive of time spent in procedures.

## 2015-05-19 DIAGNOSIS — I481 Persistent atrial fibrillation: Secondary | ICD-10-CM

## 2015-05-19 LAB — CBC WITH DIFFERENTIAL/PLATELET
BASOS PCT: 0 %
Basophils Absolute: 0 10*3/uL (ref 0–0.1)
EOS ABS: 0 10*3/uL (ref 0–0.7)
EOS PCT: 0 %
HCT: 25.6 % — ABNORMAL LOW (ref 35.0–47.0)
HEMOGLOBIN: 8.5 g/dL — AB (ref 12.0–16.0)
LYMPHS ABS: 0.3 10*3/uL — AB (ref 1.0–3.6)
Lymphocytes Relative: 3 %
MCH: 25.8 pg — AB (ref 26.0–34.0)
MCHC: 33.1 g/dL (ref 32.0–36.0)
MCV: 77.9 fL — ABNORMAL LOW (ref 80.0–100.0)
MONO ABS: 0.2 10*3/uL (ref 0.2–0.9)
MONOS PCT: 2 %
Neutro Abs: 8 10*3/uL — ABNORMAL HIGH (ref 1.4–6.5)
Neutrophils Relative %: 95 %
PLATELETS: 197 10*3/uL (ref 150–440)
RBC: 3.28 MIL/uL — ABNORMAL LOW (ref 3.80–5.20)
RDW: 16.2 % — AB (ref 11.5–14.5)
WBC: 8.4 10*3/uL (ref 3.6–11.0)

## 2015-05-19 LAB — BASIC METABOLIC PANEL
Anion gap: 8 (ref 5–15)
BUN: 64 mg/dL — AB (ref 6–20)
CALCIUM: 8.5 mg/dL — AB (ref 8.9–10.3)
CHLORIDE: 98 mmol/L — AB (ref 101–111)
CO2: 32 mmol/L (ref 22–32)
CREATININE: 1.23 mg/dL — AB (ref 0.44–1.00)
GFR calc Af Amer: 47 mL/min — ABNORMAL LOW (ref >60)
GFR calc non Af Amer: 41 mL/min — ABNORMAL LOW (ref >60)
Glucose, Bld: 147 mg/dL — ABNORMAL HIGH (ref 65–99)
Potassium: 4.4 mmol/L (ref 3.5–5.1)
SODIUM: 138 mmol/L (ref 135–145)

## 2015-05-19 LAB — GLUCOSE, CAPILLARY
GLUCOSE-CAPILLARY: 201 mg/dL — AB (ref 65–99)
GLUCOSE-CAPILLARY: 243 mg/dL — AB (ref 65–99)
Glucose-Capillary: 167 mg/dL — ABNORMAL HIGH (ref 65–99)
Glucose-Capillary: 194 mg/dL — ABNORMAL HIGH (ref 65–99)
Glucose-Capillary: 187 mg/dL — ABNORMAL HIGH (ref 65–99)

## 2015-05-19 MED ORDER — PREDNISONE 10 MG PO TABS
10.0000 mg | ORAL_TABLET | Freq: Every day | ORAL | Status: AC
  Administered 2015-05-23: 10 mg via ORAL
  Filled 2015-05-19: qty 1

## 2015-05-19 MED ORDER — PNEUMOCOCCAL VAC POLYVALENT 25 MCG/0.5ML IJ INJ: 0.5000 mL | INJECTION | INTRAMUSCULAR | Status: DC | PRN

## 2015-05-19 MED ORDER — PREDNISONE 20 MG PO TABS
50.0000 mg | ORAL_TABLET | Freq: Once | ORAL | Status: AC
  Administered 2015-05-19: 50 mg via ORAL
  Filled 2015-05-19: qty 1

## 2015-05-19 MED ORDER — PREDNISONE 10 MG PO TABS
30.0000 mg | ORAL_TABLET | Freq: Every day | ORAL | Status: AC
  Administered 2015-05-21: 30 mg via ORAL
  Filled 2015-05-19: qty 1

## 2015-05-19 MED ORDER — PREDNISONE 20 MG PO TABS
40.0000 mg | ORAL_TABLET | Freq: Every day | ORAL | Status: AC
  Administered 2015-05-20: 40 mg via ORAL
  Filled 2015-05-19: qty 2

## 2015-05-19 MED ORDER — PREDNISONE 20 MG PO TABS
20.0000 mg | ORAL_TABLET | Freq: Every day | ORAL | Status: AC
Start: 2015-05-22 — End: 2015-05-22
  Administered 2015-05-22: 20 mg via ORAL
  Filled 2015-05-19: qty 1

## 2015-05-19 MED ORDER — ENOXAPARIN SODIUM 40 MG/0.4ML ~~LOC~~ SOLN
40.0000 mg | SUBCUTANEOUS | Status: DC
  Administered 2015-05-19 – 2015-05-23 (×5): 40 mg via SUBCUTANEOUS
  Filled 2015-05-19 (×5): qty 0.4

## 2015-05-19 NOTE — Progress Notes (Signed)
0815 Dr. Juanell Fairly rounded on pt while I was in pt room. Dr. Juanell Fairly mentioned transferring pt to floor. Pt got very anxious, hr, rr, bp and wob increased. Pt asked for something to calm her down. 0830 Administered prn dose of zanex and metoprolol.  Pt also placed on bipap. Pt responded to treatment. 0900 Pt wob decreased, hr, rr and bp wnl. Pt resting comfortably in bed. Will continue to monitor.

## 2015-05-19 NOTE — Progress Notes (Signed)
Pt rested overnight. She remains on Hi flow [place] at 35%. VSS, urine output good. Family at bedside updated.

## 2015-05-19 NOTE — Progress Notes (Signed)
81 y/o F on Lovenox 30 mg daily for DVT prophylaxis with SCr improving. CrCl now 37 ml/min so will increase Lovenox to 40 mg daily as per protocol.

## 2015-05-19 NOTE — Care Management Note (Signed)
Case Management Note  Patient Details  Name: Regina Burke MRN: [id number] Date of Birth: 1933/04/16  Subjective/Objective:  Remains on Bipap PRN. Off HFNC and now on 4l/[place]. Afib with HR 80-90. Episode of HR 150's. Patient will have PO medication changes (cardizem). Possible transfer in the next 24 hours.                Action/Plan: Home with [facility]  Expected Discharge Date:                  Expected Discharge Plan:  [place]  In-House Referral:     Discharge planning Services  CM Consult  Post Acute Care Choice:    Choice offered to:     DME Arranged:    DME Agency:     HH Arranged:    [place] Agency:     Status of Service:  In process, will continue to follow  Medicare Important Message Given:  Yes-third notification given Date Medicare IM Given:    Medicare IM give by:    Date Additional Medicare IM Given:    Additional Medicare Important Message give by:     If discussed at [place] of Stay Meetings, dates discussed:    Additional Comments:  Jolly Mango, RN 05/19/2015, 10:59 AM

## 2015-05-19 NOTE — Progress Notes (Signed)
[place] [facility] Progess Note    ASSESSMENT/PLAN   81 yo white female admitted for acute resp failure with acute encephalopathy(hyperpcanic and hypoxic resp failure). Extubated and doing better. Patient felt a brief run of rapid ventricular rate with a heart rate of in the 150s, this was treated with an IV dose of metoprolol 5 mg IV push with subsequent improvement.   PULMONARY -Acute hypercapnic and hypoxic Respiratory Failure, due to pneumonia. Continue BiPAP tonight. Discussed with respiratory therapist. -Doing better today, she did not tolerate BiPAP overnight due to facial bruising. -continue Bronchodilator Therapy -She is currently on high flow nasal cannula at 35%. -We can try the patient on a nasal mask for BiPAP to see if this is better tolerated. -Patient notes she feels much better when using BiPAP. -We will wean down steroids, Solu-Medrol was changed to a prednisone taper today  Okay to transfer to the general medical floor from respiratory standpoint.  CARDIOVASCULAR -Continue by mouth metoprolol 100 mg by mouth twice a day, will supplement with short acting Cardizem 30 mg by mouth every 6 hours. -likely CHF exacerbation  RENAL  Renal function improved today.    GASTROINTESTINAL -Gi prophylaxis: H2 blocker. -Patient is encouraged to increased by mouth intake.  HEMATOLOGIC Follow cbc Microcytic anemia. -on lovenox for DVT prophylaxis  INFECTIOUS -On abx, check sputum culture while intubated.  --Right sided infiltrate on CXR.   ENDOCRINE - ICU hypoglycemic\Hyperglycemia protocol   Can consider transfer to the floor later today if her heart rate continues to be adequately controlled with current medications.        ANTIMICROBIALS:    ---------------------------------------   ----------------------------------------   Name: Regina Burke MRN: [id number] DOB: 04/17/1933    ADMISSION DATE:  05/15/2015    CHIEF  COMPLAINT:  dyspnea  SUBJECTIVE:   No new complaints today. Patient notes she is feeling well.  Review of Systems:  Constitutional: Feels well. Cardiovascular: No chest pain.  Pulmonary: Denies dyspnea.   The remainder of systems were reviewed and were found to be negative other than what is documented in the HPI.    VITAL SIGNS: Temp:  [97.8 F (36.6 C)-98.7 F (37.1 C)] 98.7 F (37.1 C) (09/21 0400) Pulse Rate:  [49-130] 85 (09/21 0600) Resp:  [14-27] 14 (09/21 0600) BP: (134-165)/(55-97) 153/84 mmHg (09/21 0600) SpO2:  [93 %-100 %] 98 % (09/21 0600) FiO2 (%):  [35 %] 35 % (09/21 0223) HEMODYNAMICS:   VENTILATOR SETTINGS: Vent Mode:  [-]  FiO2 (%):  [35 %] 35 % INTAKE / OUTPUT:  Intake/Output Summary (Last 24 hours) at 05/19/15 0829 Last data filed at 05/19/15 0554  Gross per 24 hour  Intake   2750 ml  Output    750 ml  Net   2000 ml    PHYSICAL EXAMINATION: Physical Examination:   VS: BP 153/84 mmHg  Pulse 85  Temp(Src) 98.7 F (37.1 C) (Oral)  Resp 14  Ht '5\' 4"'$  (1.626 m)  Wt 74.571 kg (164 lb 6.4 oz)  BMI 28.21 kg/m2  SpO2 98%  General Appearance: No distress  Neuro:without focal findings, mental status normal. HEENT: PERRLA, EOM intact. Prominent facial bruising typically above the right orbit. Pulmonary: normal breath sounds  , decreased bilaterally. CardiovascularNormal S1,S2.  No m/r/g.   Abdomen: Benign, Soft, non-tender. Renal:  No costovertebral tenderness  GU:  Not performed at this time. Endocrine: No evident thyromegaly. Skin:   warm, no rashes, no ecchymosis  Extremities: normal, no cyanosis, clubbing.  LABS:   LABORATORY PANEL:   CBC  Recent Labs Lab 05/19/15 0351  WBC 8.4  HGB 8.5*  HCT 25.6*  PLT 197    Chemistries   Recent Labs Lab 05/16/15 2155  05/17/15 0814  05/19/15 0351  NA 133*  < > 137  < > 138  K 7.0*  < > 5.0  < > 4.4  CL 98*  < > 100*  < > 98*  CO2 27  < > 27  < > 32  GLUCOSE 344*  < > 204*  < >  147*  BUN 61*  < > 71*  < > 64*  CREATININE 1.83*  < > 1.87*  < > 1.23*  CALCIUM 8.4*  < > 9.1  < > 8.5*  MG 2.0  --   --   --   --   PHOS 8.1*  --   --   --   --   AST 30  30  --  29  --   --   ALT 16  15  --  15  --   --   ALKPHOS 41  41  --  39  --   --   BILITOT 0.7  0.8  --  0.7  --   --   < > = values in this interval not displayed.   Recent Labs Lab 05/18/15 0733 05/18/15 1129 05/18/15 1554 05/18/15 2008 05/19/15 0008 05/19/15 0720  GLUCAP 164* 251* 166* 147* 201* 167*    Recent Labs Lab 05/17/15 0910 05/17/15 1215 05/18/15 0427  PHART 7.28* 7.33* 7.41  PCO2ART 63* 59* 54*  PO2ART 70* 66* 109*    Recent Labs Lab 05/16/15 2155 05/17/15 0814  AST '30  30 29  '$ ALT '16  15 15  '$ ALKPHOS 41  41 39  BILITOT 0.7  0.8 0.7  ALBUMIN 3.6  3.6 3.5    Cardiac Enzymes  Recent Labs Lab 05/15/15 1233  TROPONINI <0.03    RADIOLOGY:  Dg Chest Port 1 View  05/17/2015   CLINICAL DATA:  Respiratory failure.  EXAM: PORTABLE CHEST - 1 VIEW  COMPARISON:  None.  FINDINGS: Endotracheal tube, NG line, left IJ line in stable position. Cardiomegaly with pulmonary vascular prominence and bilateral interstitial prominence consistent with congestive heart failure. Interval partial clearing of pulmonary interstitial edema. Small bilateral pleural effusions. Surgical clips right upper abdomen.  IMPRESSION: 1. Lines and tubes in stable position. 2. Congestive heart failure with partial clearing of bilateral pulmonary interstitial edema. Small bilateral pleural effusions   Electronically Signed   By: Marcello Moores  Register   On: 05/17/2015 08:54       --Marda Stalker, MD.  [facility]  Patricia Pesa, M.D.  Vilinda Boehringer, M.D.  Merton Border.

## 2015-05-19 NOTE — Progress Notes (Signed)
Patient ID: Regina Burke, female   DOB: 1933/08/11, 81 y.o.   MRN: [id number]  Regina Burke is a 81 y.o. female discharged recently after a fall- re-admitted with acute on chronic respiratory failure and rapid AF.  This am c/o feeling weak On 3 l n/c O2 and BIPAP at night    ______________________________________________________________________  ROS: Review of systems is unremarkable for any active cardiac, GI, GU, hematologic, neurologic or psychiatric systems, 10 systems reviewed.  Marland Kitchen aspirin  81 mg Oral Daily  . atorvastatin  10 mg Oral Daily  . azithromycin  250 mg Oral Daily  . cefTRIAXone (ROCEPHIN)  IV  1 g Intravenous Q24H  . cholecalciferol  400 Units Oral Daily  . clopidogrel  75 mg Oral Daily  . vitamin B-12  500 mcg Oral Daily  . diltiazem  60 mg Oral 3 times per day  . enoxaparin (LOVENOX) injection  40 mg Subcutaneous Q24H  . famotidine  20 mg Oral Daily  . febuxostat  40 mg Oral Daily  . fenofibrate  54 mg Oral Daily  . ferrous sulfate  325 mg Oral Daily  . insulin aspart  0-24 Units Subcutaneous 6 times per day  . methylPREDNISolone (SOLU-MEDROL) injection  60 mg Intravenous 3 times per day  . metoprolol succinate  100 mg Oral Daily  . multivitamin-lutein  1 capsule Oral Daily  . pneumococcal 23 valent vaccine  0.5 mL Intramuscular Tomorrow-1000  . sertraline  25 mg Oral Daily  . vitamin C  500 mg Oral Daily   acetaminophen **OR** acetaminophen, ALPRAZolam, docusate sodium, ipratropium-albuterol, metoprolol, ondansetron **OR** ondansetron (ZOFRAN) IV, senna   Past Medical History  Diagnosis Date  . COPD (chronic obstructive pulmonary disease)   . CHF (congestive heart failure)   . Hypertension   . Rapid palpitations 2014    Seen at [facility], may have been atrial fibrillation  . DOE (dyspnea on exertion)     Started after treatment for lung cancer  . High cholesterol   . Heart murmur   . Myocardial infarction 12/22/2014   . Lung cancer dx'd 2014    S/P  radiation 2015  . Sleep apnea   . Cushing's disease   . Type II diabetes mellitus   . Anemia   . GERD (gastroesophageal reflux disease)   . History of hiatal hernia   . Migraine   . Arthritis   . Gout   . Depression   . Chronic kidney disease (CKD), stage III (moderate)     Past Surgical History  Procedure Laterality Date  . Adrenalectomy Left 1980's    "Cushings"  . Abdominal hysterectomy    . Appendectomy    . Cholecystectomy    . Coronary angioplasty with stent placement  12/23/2014  . Tonsillectomy    . Fracture surgery    . Wrist fracture surgery Bilateral ~ 2000  . Breast cyst excision Left   . Tubal ligation    . Left heart catheterization with coronary angiogram N/A 12/23/2014    Procedure: LEFT HEART CATHETERIZATION WITH CORONARY ANGIOGRAM;  Surgeon: Burnell Blanks, MD;  Location: [facility] CATH LAB;  Service: Cardiovascular;  Laterality: N/A;  . Percutaneous coronary stent intervention (pci-s)  12/23/2014    Procedure: PERCUTANEOUS CORONARY STENT INTERVENTION (PCI-S);  Surgeon: Burnell Blanks, MD;  Location: [facility] CATH LAB;  Service: Cardiovascular;;  Promus 2.25x8    PHYSICAL EXAM:  BP 153/84 mmHg  Pulse 85  Temp(Src) 98.7 F (37.1 C) (Oral)  Resp 14  Ht  $'5\' 4"'Y$  (1.626 m)  Wt 74.571 kg (164 lb 6.4 oz)  BMI 28.21 kg/m2  SpO2 98%  Wt Readings from Last 3 Encounters:  05/16/15 74.571 kg (164 lb 6.4 oz)  05/08/15 68.04 kg (150 lb)  04/14/15 68.04 kg (150 lb)           BP Readings from Last 3 Encounters:  05/19/15 153/84  05/11/15 151/78  04/15/15 154/54    Constitutional:On vent  Neck: supple, no thyromegaly Ecchymosis noted on face and chin Respiratory: Rhonchi + bilat  Cardiovascular:Iregular Rhythm-, no murmur, no gallop Abdomen: soft, good BS, nontender Extremities: no edema Neuro: alert and oriented, no focal motor or sensory deficits  ASSESSMENT/PLAN:  Labs and imaging studies were reviewed  1 Acute On Chronic Hypoxemic and  Hypercapnic  Respiratory failure/  Diastolic CHF Possible Pneumonia Continue IV Solumedrol, Antibiotics  and bronchodilators On 3 l N/c O2 and BIPAP at night    2  Atrial fibrillation with rapid ventricular response-likely worsened from respiratory issues. On Diltiazem and Metoprolol  Recent echo with the normal ejection fraction, diastolic dysfunction noted.  3 Hypertension-continue  Metoprolol and Cardizem.  4 Diabetes mellitus-On  SSI  5 Hyperkalemia-recent admission with hyperkalemia as well. Her valsartan has been stopped - Se K 4.6- Continue to monitor   6 CKD stage III-stable at this time.  7 Anemia- Hgb 8.5- Continue to monitor    DVT prophylaxis-Lovenox May be able to go to floor with telemetry later today Discussed with family

## 2015-05-19 NOTE — Care Management Important Message (Signed)
Important Message  Patient Details  Name: Regina Burke MRN: [id number] Date of Birth: 09-Jul-1933   Medicare Important Message Given:  Yes-third notification given    Juliann Pulse A Allmond 05/19/2015, 10:20 AM

## 2015-05-20 ENCOUNTER — Inpatient Hospital Stay: Payer: Medicare Other

## 2015-05-20 LAB — GLUCOSE, CAPILLARY
GLUCOSE-CAPILLARY: 128 mg/dL — AB (ref 65–99)
GLUCOSE-CAPILLARY: 132 mg/dL — AB (ref 65–99)
GLUCOSE-CAPILLARY: 132 mg/dL — AB (ref 65–99)
GLUCOSE-CAPILLARY: 201 mg/dL — AB (ref 65–99)
GLUCOSE-CAPILLARY: 239 mg/dL — AB (ref 65–99)
Glucose-Capillary: 123 mg/dL — ABNORMAL HIGH (ref 65–99)
Glucose-Capillary: 188 mg/dL — ABNORMAL HIGH (ref 65–99)

## 2015-05-20 LAB — CBC WITH DIFFERENTIAL/PLATELET
BASOS ABS: 0 10*3/uL (ref 0–0.1)
BASOS PCT: 0 %
EOS ABS: 0 10*3/uL (ref 0–0.7)
Eosinophils Relative: 0 %
HCT: 26.7 % — ABNORMAL LOW (ref 35.0–47.0)
HEMOGLOBIN: 8.8 g/dL — AB (ref 12.0–16.0)
Lymphocytes Relative: 6 %
Lymphs Abs: 0.4 10*3/uL — ABNORMAL LOW (ref 1.0–3.6)
MCH: 25.8 pg — ABNORMAL LOW (ref 26.0–34.0)
MCHC: 32.8 g/dL (ref 32.0–36.0)
MCV: 78.6 fL — ABNORMAL LOW (ref 80.0–100.0)
Monocytes Absolute: 0.5 10*3/uL (ref 0.2–0.9)
Monocytes Relative: 7 %
NEUTROS PCT: 87 %
Neutro Abs: 6.3 10*3/uL (ref 1.4–6.5)
Platelets: 206 10*3/uL (ref 150–440)
RBC: 3.4 MIL/uL — AB (ref 3.80–5.20)
RDW: 16.3 % — ABNORMAL HIGH (ref 11.5–14.5)
WBC: 7.2 10*3/uL (ref 3.6–11.0)

## 2015-05-20 LAB — BASIC METABOLIC PANEL
Anion gap: 7 (ref 5–15)
BUN: 61 mg/dL — ABNORMAL HIGH (ref 6–20)
CHLORIDE: 96 mmol/L — AB (ref 101–111)
CO2: 36 mmol/L — ABNORMAL HIGH (ref 22–32)
CREATININE: 1.07 mg/dL — AB (ref 0.44–1.00)
Calcium: 8.7 mg/dL — ABNORMAL LOW (ref 8.9–10.3)
GFR, EST AFRICAN AMERICAN: 56 mL/min — AB (ref >60)
GFR, EST NON AFRICAN AMERICAN: 48 mL/min — AB (ref >60)
Glucose, Bld: 171 mg/dL — ABNORMAL HIGH (ref 65–99)
POTASSIUM: 4.6 mmol/L (ref 3.5–5.1)
SODIUM: 139 mmol/L (ref 135–145)

## 2015-05-20 LAB — CULTURE, RESPIRATORY W GRAM STAIN

## 2015-05-20 LAB — CULTURE, RESPIRATORY: CULTURE: NORMAL

## 2015-05-20 NOTE — Progress Notes (Signed)
Alert and oriented today. No complaints of pain other than some discomfort from hematoma on head from previous fall, no request for medication. Xanax given once. Patient requested to use bipap for a short period during the day today and breathing was much improved after wearing it. Currently on chronic 3L of oxygen. Has remained in sinus rhythm on tele. Will continue to monitor.

## 2015-05-20 NOTE — Progress Notes (Signed)
[facility] Progess Note    ASSESSMENT/PLAN   81 yo white female admitted for acute resp failure with acute encephalopathy(hyperpcanic and hypoxic resp failure). Extubated and doing better. Patient felt a brief run of rapid ventricular rate with a heart rate of in the 150s, this was treated with an IV dose of metoprolol 5 mg IV push with subsequent improvement.   PULMONARY -Acute hypercapnic and hypoxic Respiratory Failure, due to pneumonia. Continue BiPAP nightly, is now tolerating better. -continue Bronchodilator Therapy -She is currently on high flow nasal cannula at 35%. -Check limited overnight oximetry today to document desats, this may allow her to qualify for home bipap.  -Continue duonebs, prednisone taper.   Okay to transfer to the general medical floor from respiratory standpoint.  CARDIOVASCULAR -Continue by mouth metoprolol 100 mg by mouth twice a day, will supplement with short acting Cardizem 30 mg by mouth every 6 hours. -likely CHF exacerbation  RENAL Renal function improved today.    GASTROINTESTINAL -Gi prophylaxis: H2 blocker. -Patient is encouraged to increased by mouth intake.  HEMATOLOGIC Follow cbc Microcytic anemia. -on lovenox for DVT prophylaxis  INFECTIOUS -On ceftriaxone. --Right sided infiltrate on CXR.   ENDOCRINE - ICU hypoglycemic\Hyperglycemia protocol            ---------------------------------------   ----------------------------------------   Name: Regina Burke MRN: [id number] DOB: Jul 20, 1933    ADMISSION DATE:  05/15/2015    CHIEF COMPLAINT:  dyspnea  SUBJECTIVE:   No new complaints today. Patient notes she is feeling well. Feels better after wearing the bipap and feels that it is helping her.   Review of Systems:  Constitutional: Feels well. Cardiovascular: No chest pain.  Pulmonary: Denies dyspnea.   The remainder of systems were reviewed and were found to be negative other  than what is documented in the HPI.    VITAL SIGNS: Temp:  [97.7 F (36.5 C)-98.6 F (37 C)] 98 F (36.7 C) (09/22 0952) Pulse Rate:  [50-112] 77 (09/22 0952) Resp:  [13-27] 24 (09/22 0952) BP: (125-186)/(55-174) 154/74 mmHg (09/22 0952) SpO2:  [96 %-100 %] 96 % (09/22 1201) HEMODYNAMICS:   VENTILATOR SETTINGS:   INTAKE / OUTPUT:  Intake/Output Summary (Last 24 hours) at 05/20/15 1347 Last data filed at 05/20/15 0900  Gross per 24 hour  Intake    836 ml  Output   2200 ml  Net  -1364 ml    PHYSICAL EXAMINATION: Physical Examination:   VS: BP 154/74 mmHg  Pulse 77  Temp(Src) 98 F (36.7 C) (Oral)  Resp 24  Ht '5\' 4"'$  (1.626 m)  Wt 74.571 kg (164 lb 6.4 oz)  BMI 28.21 kg/m2  SpO2 96%  General Appearance: No distress  Neuro:without focal findings, mental status normal. HEENT: PERRLA, EOM intact. Prominent facial bruising typically above the right orbit. Pulmonary: normal breath sounds  , decreased bilaterally. CardiovascularNormal S1,S2.  No m/r/g.   Abdomen: Benign, Soft, non-tender. Renal:  No costovertebral tenderness  GU:  Not performed at this time. Endocrine: No evident thyromegaly. Skin:   warm, no rashes, no ecchymosis  Extremities: normal, no cyanosis, clubbing.   LABS:   LABORATORY PANEL:   CBC  Recent Labs Lab 05/20/15 0511  WBC 7.2  HGB 8.8*  HCT 26.7*  PLT 206    Chemistries   Recent Labs Lab 05/16/15 2155  05/17/15 0814  05/20/15 0511  NA 133*  < > 137  < > 139  K 7.0*  < > 5.0  < >  4.6  CL 98*  < > 100*  < > 96*  CO2 27  < > 27  < > 36*  GLUCOSE 344*  < > 204*  < > 171*  BUN 61*  < > 71*  < > 61*  CREATININE 1.83*  < > 1.87*  < > 1.07*  CALCIUM 8.4*  < > 9.1  < > 8.7*  MG 2.0  --   --   --   --   PHOS 8.1*  --   --   --   --   AST 30  30  --  29  --   --   ALT 16  15  --  15  --   --   ALKPHOS 41  41  --  39  --   --   BILITOT 0.7  0.8  --  0.7  --   --   < > = values in this interval not displayed.   Recent  Labs Lab 05/19/15 1608 05/19/15 2014 05/20/15 0029 05/20/15 0417 05/20/15 0725 05/20/15 1122  GLUCAP 187* 243* 132* 132* 123* 188*    Recent Labs Lab 05/17/15 0910 05/17/15 1215 05/18/15 0427  PHART 7.28* 7.33* 7.41  PCO2ART 63* 59* 54*  PO2ART 70* 66* 109*    Recent Labs Lab 05/16/15 2155 05/17/15 0814  AST '30  30 29  '$ ALT '16  15 15  '$ ALKPHOS 41  41 39  BILITOT 0.7  0.8 0.7  ALBUMIN 3.6  3.6 3.5    Cardiac Enzymes  Recent Labs Lab 05/15/15 1233  TROPONINI <0.03    RADIOLOGY:  Dg Chest 1 View  05/20/2015   CLINICAL DATA:  Shortness of breath.  EXAM: CHEST  1 VIEW  COMPARISON:  05/17/2015.  FINDINGS: Left IJ line in good anatomic position. Mediastinum hilar structures normal. Cardiomegaly with bilateral pulmonary alveolar infiltrates and pleural effusions consistent congestive heart failure. Bilateral pneumonia cannot be excluded. No pneumothorax. Surgical clips right upper chest.  IMPRESSION: 1. Lines and tubes in stable position. 2. Cardiomegaly with bilateral basilar pulmonary alveolar infiltrates and pleural effusions consistent with congestive heart failure. Bibasilar pneumonia cannot be excluded.   Electronically Signed   By: Marcello Moores  Register   On: 05/20/2015 07:15       --Marda Stalker, MD.  [facility]  Patricia Pesa, M.D.  Vilinda Boehringer, M.D.  [facility].

## 2015-05-20 NOTE — Progress Notes (Signed)
Room air pulse oximetry results. On 3L [place] pt SPO2 was 97%, HR 64. After 5 mins on RA pt SPO2 was 88%, HR 67, at 10 mins on RA pt SPO2 was 83%, HR 67. Pt was then placed on bipap with an FIO2 of .35 and her SPO2 is 95% and she is tol well.

## 2015-05-20 NOTE — Care Management (Signed)
Received patient from ICU.  Extubated 9/19.  At present, requiring 3 liters of 02 which is acute.  She has required use of BiPAP which is reported to be an acute need.  It is documented that it has been recommended that patient transition to skilled nursing facility.  CM will speak with patient to clarify

## 2015-05-20 NOTE — Progress Notes (Signed)
Patient transported from ICU today. In sinus rhythm with PAC's on tele. No complaints of pain. Patient is resting comfortably in the room with call bell in reach, per patient request to use bed call light. Will continue to monitor.

## 2015-05-20 NOTE — Progress Notes (Signed)
Patient ID: Regina Burke, female   DOB: [birth year], 81 y.o.   MRN: [id number]  Regina Burke is a 81 y.o. female discharged recently after a fall- re-admitted with acute on chronic respiratory failure and rapid AF.  Still weak  On 3 l n/c O2 and BIPAP at night Less anxious    ______________________________________________________________________  ROS: Review of systems is unremarkable for any active cardiac, GI, GU, hematologic, neurologic or psychiatric systems, 10 systems reviewed.  Marland Kitchen aspirin  81 mg Oral Daily  . atorvastatin  10 mg Oral Daily  . cefTRIAXone (ROCEPHIN)  IV  1 g Intravenous Q24H  . cholecalciferol  400 Units Oral Daily  . clopidogrel  75 mg Oral Daily  . vitamin B-12  500 mcg Oral Daily  . diltiazem  60 mg Oral 3 times per day  . enoxaparin (LOVENOX) injection  40 mg Subcutaneous Q24H  . famotidine  20 mg Oral Daily  . febuxostat  40 mg Oral Daily  . fenofibrate  54 mg Oral Daily  . ferrous sulfate  325 mg Oral Daily  . insulin aspart  0-24 Units Subcutaneous 6 times per day  . metoprolol succinate  100 mg Oral Daily  . multivitamin-lutein  1 capsule Oral Daily  . [START ON 05/21/2015] predniSONE  30 mg Oral Q breakfast   Followed by  . [START ON 05/22/2015] predniSONE  20 mg Oral Q breakfast   Followed by  . [START ON 05/23/2015] predniSONE  10 mg Oral Q breakfast  . sertraline  25 mg Oral Daily  . vitamin C  500 mg Oral Daily   acetaminophen **OR** acetaminophen, ALPRAZolam, docusate sodium, ipratropium-albuterol, metoprolol, ondansetron **OR** ondansetron (ZOFRAN) IV, pneumococcal 23 valent vaccine, senna   Past Medical History  Diagnosis Date  . COPD (chronic obstructive pulmonary disease)   . CHF (congestive heart failure)   . Hypertension   . Rapid palpitations 2014    Se[facility], may have been atrial fibrillation  . DOE (dyspnea on exertion)     Started after treatment for lung cancer  . High cholesterol   . Heart murmur   . Myocardial  infarction 12/22/2014   . Lung cancer dx'd 2014    S/P radiation 2015  . Sleep apnea   . Cushing's disease   . Type II diabetes mellitus   . Anemia   . GERD (gastroesophageal reflux disease)   . History of hiatal hernia   . Migraine   . Arthritis   . Gout   . Depression   . Chronic kidney disease (CKD), stage III (moderate)     Past Surgical History  Procedure Laterality Date  . Adrenalectomy Left 1980's    "Cushings"  . Abdominal hysterectomy    . Appendectomy    . Cholecystectomy    . Coronary angioplasty with stent placement  12/23/2014  . Tonsillectomy    . Fracture surgery    . Wrist fracture surgery Bilateral ~ 2000  . Breast cyst excision Left   . Tubal ligation    . Left heart catheterization with coronary angiogram N/A 12/23/2014    Procedure: LEFT HEART CATHETERIZATION WITH CORONARY ANGIOGRAM;  Surgeon: Burnell Blanks, MD;  Location: [facility] CATH LAB;  Service: Cardiovascular;  Laterality: N/A;  . Percutaneous coronary stent intervention (pci-s)  12/23/2014    Procedure: PERCUTANEOUS CORONARY STENT INTERVENTION (PCI-S);  Surgeon: Burnell Blanks, MD;  Location: [facility] CATH LAB;  Service: Cardiovascular;;  Promus 2.25x8    PHYSICAL EXAM:  BP 144/72  mmHg  Pulse 77  Temp(Src) 97.7 F (36.5 C) (Oral)  Resp 23  Ht '5\' 4"'$  (1.626 m)  Wt 74.571 kg (164 lb 6.4 oz)  BMI 28.21 kg/m2  SpO2 99%  Wt Readings from Last 3 Encounters:  05/16/15 74.571 kg (164 lb 6.4 oz)  05/08/15 68.04 kg (150 lb)  04/14/15 68.04 kg (150 lb)           BP Readings from Last 3 Encounters:  05/20/15 144/72  05/11/15 151/78  04/15/15 154/54    Constitutional:Anxious  Neck: supple, no thyromegaly Ecchymosis noted on face and chin Respiratory: Decreased air entry bilat  Cardiovascular:Iregular Rhythm-, no murmur, no gallop Abdomen: soft, good BS, nontender Extremities: no edema Neuro: alert and oriented, no focal motor or sensory deficits  ASSESSMENT/PLAN:  Labs and imaging  studies were reviewed  1 Acute On Chronic Hypoxemic and Hypercapnic  Respiratory failure/  Diastolic CHF Possible Pneumonia Continue IV Solumedrol, Antibiotics  and bronchodilators On 3 l N/c O2 and BIPAP at night    2  Atrial fibrillation with rapid ventricular response-likely worsened from respiratory issues. On Diltiazem and Metoprolol  Recent echo with the normal ejection fraction, diastolic dysfunction noted.  3 Hypertensive Heart disease;Continue  Metoprolol and Cardizem.  4 Diabetes mellitus-On  SSI  5 Hyperkalemia-recent admission with hyperkalemia as well. Her valsartan has been stopped - Se K 4.6- Continue to monitor   6 CKD stage III-stable at this time.  7 Anemia- Hgb stable at  8.8- Continue to monitor   8 Weakness;Advance diet/ Physical therapy    DVT prophylaxis-Lovenox Transfer to Floor with telemetry Discussed with family- Will likely need SNF

## 2015-05-20 NOTE — Evaluation (Addendum)
Physical Therapy Re-Evaluation Patient Details Name: Regina Burke MRN: [id number] DOB: [birth year] Today's Date: 05/20/2015   History of Present Illness  Pt was here last week after a fall, went home and did well for a few day and then had breathing and heart issues. Pt transferred out of ICU and onto floor earlier today based on medical stability.   Clinical Impression  Upon evaluation, pt awake and alert sitting up in her bed with her legs crossed. Pt able to respond appropriately to all questions/commands. Pt is still on active bed rest orders at this time so only bed mobility and gross UE/LE  ROM/strength assessment performed. Pt's vitals were checked prior to any activity; SpO2%: 96 HR: 69. After completion of physical activity pt's vitals were; SpO2%:94, HR: 82. Pt is currently on 3L O2 delivered via [place]. Pt was independent with bed mobility; did not require the use of bed rails for transition between lying supine w/ HOB elevated to sitting on EOB. Pt able to maintain seated balance without use of UE for support. Pt requires further skilled acute PT services in order to progress functional mobility and strength as bed rest orders allow. Current d/c recommendations are for pt to enter a SNF to receive further close medical attention; recommendation based on pt's current respiratory status and generalized weakness that limits a safe return to home environment. Recommendations subject to change pending any significant increases in functionl and respiratory status.     Follow Up Recommendations SNF    Equipment Recommendations       Recommendations for Other Services       Precautions / Restrictions Precautions Precautions: Fall Precaution Comments: Pt has fallen several times recently. Restrictions Other Position/Activity Restrictions: pt has active bed rest orders      Mobility  Bed Mobility Overal bed mobility: Independent             General bed mobility comments: Pt does  not need hand rails to get to EOB  Transfers                    Ambulation/Gait                Stairs            Wheelchair Mobility    Modified Rankin (Stroke Patients Only)       Balance Overall balance assessment: Independent (seated balance only performed today:pt able to maintain seated position on EOB without UE support. Slight posterior lean noted when UE support taken away. )                                           Pertinent Vitals/Pain Pain Assessment:  (pain with breathing however pt did not rate)    Home Living Family/patient expects to be discharged to:: Private residence Living Arrangements: Children Available Help at Discharge: Family Type of Home: House Home Access: Stairs to enter   [employer] of Steps: 1 step to get onto porch, one step to get into house, one step inside house between rooms. Can hold onto wall for support with each step.   Home Equipment: [place] - 2 wheels;Cane - single point;Shower seat;Grab bars - tub/shower      Prior Function Level of Independence: Independent with assistive device(s)         Comments: Pt states that she is able to get  around her house with her walker/cane but son states that she has not been using them frequently; which has led to her recent falls. Pt states that when she goes out to the grocery store or out in the community she always brings her walker.      Hand Dominance        Extremity/Trunk Assessment   Upper Extremity Assessment: Overall WFL for tasks assessed;Generalized weakness (gross MMT screen reveal bilat UE strength 3+/5)           Lower Extremity Assessment: Overall WFL for tasks assessed (gross MMT screen revealed strength of bilat LEs at least 3+/5)         Communication   Communication: No difficulties  Cognition Arousal/Alertness: Awake/alert Behavior During Therapy: WFL for tasks assessed/performed Overall Cognitive Status:  Within Functional Limits for tasks assessed                      General Comments      Exercises        Assessment/Plan    PT Assessment Patient needs continued PT services  PT Diagnosis Generalized weakness   PT Problem List Decreased strength;Decreased activity tolerance;Decreased mobility;Cardiopulmonary status limiting activity;Pain  PT Treatment Interventions DME instruction;Gait training;Stair training;Functional mobility training;Therapeutic activities;Therapeutic exercise;Balance training   PT Goals (Current goals can be found in the Care Plan section) Acute Rehab PT Goals Patient Stated Goal: "I just want to go home" PT Goal Formulation: With patient/family Time For Goal Achievement: 06/03/15 Potential to Achieve Goals: Fair    Frequency Min 2X/week   Barriers to discharge        Co-evaluation               End of Session   Activity Tolerance: Other (comment) (pt limited to bed mobility due to active bed rest orders.) Patient left: in bed;with nursing/sitter in room;with family/visitor present;with call bell/phone within reach           Time: 1015-1032 PT Time Calculation (min) (ACUTE ONLY): 17 min   Charges:         PT G Codes:        Denna Haggard June 09, 2015, 12:29 PM   Evaluation, documentation completed by Denna Haggard, SPT.  This patient note, response to treatment and overall treatment plan has been reviewed and this clinician agrees with the information provided.  Jaretzi Droz H. Owens Shark, PT, DPT, NCS 06-09-15, 2:50 PM [phone]

## 2015-05-21 LAB — BASIC METABOLIC PANEL
Anion gap: 6 (ref 5–15)
BUN: 59 mg/dL — AB (ref 6–20)
CALCIUM: 8.5 mg/dL — AB (ref 8.9–10.3)
CO2: 37 mmol/L — ABNORMAL HIGH (ref 22–32)
CREATININE: 1 mg/dL (ref 0.44–1.00)
Chloride: 96 mmol/L — ABNORMAL LOW (ref 101–111)
GFR calc Af Amer: >60 mL/min (ref >60)
GFR, EST NON AFRICAN AMERICAN: 52 mL/min — AB (ref >60)
Glucose, Bld: 104 mg/dL — ABNORMAL HIGH (ref 65–99)
Potassium: 4.9 mmol/L (ref 3.5–5.1)
SODIUM: 139 mmol/L (ref 135–145)

## 2015-05-21 LAB — CBC WITH DIFFERENTIAL/PLATELET
Basophils Absolute: 0 10*3/uL (ref 0–0.1)
Basophils Relative: 0 %
EOS ABS: 0 10*3/uL (ref 0–0.7)
EOS PCT: 0 %
HCT: 25.4 % — ABNORMAL LOW (ref 35.0–47.0)
Hemoglobin: 8.3 g/dL — ABNORMAL LOW (ref 12.0–16.0)
LYMPHS ABS: 0.9 10*3/uL — AB (ref 1.0–3.6)
Lymphocytes Relative: 16 %
MCH: 25.7 pg — AB (ref 26.0–34.0)
MCHC: 32.8 g/dL (ref 32.0–36.0)
MCV: 78.3 fL — ABNORMAL LOW (ref 80.0–100.0)
MONOS PCT: 6 %
Monocytes Absolute: 0.3 10*3/uL (ref 0.2–0.9)
Neutro Abs: 4.3 10*3/uL (ref 1.4–6.5)
Neutrophils Relative %: 78 %
PLATELETS: 191 10*3/uL (ref 150–440)
RBC: 3.24 MIL/uL — ABNORMAL LOW (ref 3.80–5.20)
RDW: 16.1 % — AB (ref 11.5–14.5)
WBC: 5.5 10*3/uL (ref 3.6–11.0)

## 2015-05-21 LAB — GLUCOSE, CAPILLARY
GLUCOSE-CAPILLARY: 100 mg/dL — AB (ref 65–99)
GLUCOSE-CAPILLARY: 270 mg/dL — AB (ref 65–99)
GLUCOSE-CAPILLARY: 80 mg/dL (ref 65–99)
Glucose-Capillary: 92 mg/dL (ref 65–99)
Glucose-Capillary: 223 mg/dL — ABNORMAL HIGH (ref 65–99)
Glucose-Capillary: 264 mg/dL — ABNORMAL HIGH (ref 65–99)

## 2015-05-21 MED ORDER — SODIUM CHLORIDE 0.9 % IJ SOLN: 10.0000 mL | INTRAMUSCULAR | Status: DC | PRN

## 2015-05-21 MED ORDER — SODIUM CHLORIDE 0.9 % IJ SOLN
10.0000 mL | Freq: Two times a day (BID) | INTRAMUSCULAR | Status: DC
  Administered 2015-05-21: 30 mL
  Administered 2015-05-21: 15 mL
  Administered 2015-05-22 (×2): 10 mL
  Administered 2015-05-23: 30 mL
  Administered 2015-05-24: 10 mL

## 2015-05-21 NOTE — Progress Notes (Signed)
Patient ID: MEMORIE YOKOYAMA, female   DOB: [birth year], 81 y.o.   MRN: [id number]  Calleigh Lafontant is a 81 y.o. female discharged recently after a fall- re-admitted with acute on chronic respiratory failure secondary to COPD, Pneumonia  and rapid AF.  On 3 l n/c O2 and BIPAP at night States BIPAP hurts her face  Pt appears to be making slow progress. Still weak and has difficulty in standing    ______________________________________________________________________  ROS: Review of systems is unremarkable for any active cardiac, GI, GU, hematologic, neurologic or psychiatric systems, 10 systems reviewed.  Marland Kitchen aspirin  81 mg Oral Daily  . atorvastatin  10 mg Oral Daily  . cefTRIAXone (ROCEPHIN)  IV  1 g Intravenous Q24H  . cholecalciferol  400 Units Oral Daily  . clopidogrel  75 mg Oral Daily  . vitamin B-12  500 mcg Oral Daily  . diltiazem  60 mg Oral 3 times per day  . enoxaparin (LOVENOX) injection  40 mg Subcutaneous Q24H  . famotidine  20 mg Oral Daily  . febuxostat  40 mg Oral Daily  . fenofibrate  54 mg Oral Daily  . ferrous sulfate  325 mg Oral Daily  . insulin aspart  0-24 Units Subcutaneous 6 times per day  . metoprolol succinate  100 mg Oral Daily  . multivitamin-lutein  1 capsule Oral Daily  . predniSONE  30 mg Oral Q breakfast   Followed by  . [START ON 05/22/2015] predniSONE  20 mg Oral Q breakfast   Followed by  . [START ON 05/23/2015] predniSONE  10 mg Oral Q breakfast  . sertraline  25 mg Oral Daily  . sodium chloride  10-40 mL Intracatheter Q12H  . vitamin C  500 mg Oral Daily   acetaminophen **OR** acetaminophen, ALPRAZolam, docusate sodium, ipratropium-albuterol, metoprolol, ondansetron **OR** ondansetron (ZOFRAN) IV, pneumococcal 23 valent vaccine, senna, sodium chloride   Past Medical History  Diagnosis Date  . COPD (chronic obstructive pulmonary disease)   . CHF (congestive heart failure)   . Hypertension   . Rapid palpitations 2014    Seen at [facility], may  have been atrial fibrillation  . DOE (dyspnea on exertion)     Started after treatment for lung cancer  . High cholesterol   . Heart murmur   . Myocardial infarction 12/22/2014   . Lung cancer dx'd 2014    S/P radiation 2015  . Sleep apnea   . Cushing's disease   . Type II diabetes mellitus   . Anemia   . GERD (gastroesophageal reflux disease)   . History of hiatal hernia   . Migraine   . Arthritis   . Gout   . Depression   . Chronic kidney disease (CKD), stage III (moderate)     Past Surgical History  Procedure Laterality Date  . Adrenalectomy Left 1980's    "[place]"  . Abdominal hysterectomy    . Appendectomy    . Cholecystectomy    . Coronary angioplasty with stent placement  12/23/2014  . Tonsillectomy    . Fracture surgery    . Wrist fracture surgery Bilateral ~ 2000  . Breast cyst excision Left   . Tubal ligation    . Left heart catheterization with coronary angiogram N/A 12/23/2014    Procedure: LEFT HEART CATHETERIZATION WITH CORONARY ANGIOGRAM;  Surgeon: Burnell Blanks, MD;  Location: [facility] CATH LAB;  Service: Cardiovascular;  Laterality: N/A;  . Percutaneous coronary stent intervention (pci-s)  12/23/2014    Procedure: PERCUTANEOUS CORONARY  STENT INTERVENTION (PCI-S);  Surgeon: Burnell Blanks, MD;  Location: [facility] CATH LAB;  Service: Cardiovascular;;  Promus 2.25x8    PHYSICAL EXAM:  BP 166/50 mmHg  Pulse 62  Temp(Src) 97.6 F (36.4 C) (Oral)  Resp 16  Ht '5\' 4"'$  (1.626 m)  Wt 74.571 kg (164 lb 6.4 oz)  BMI 28.21 kg/m2  SpO2 100%  Wt Readings from Last 3 Encounters:  05/16/15 74.571 kg (164 lb 6.4 oz)  05/08/15 68.04 kg (150 lb)  04/14/15 68.04 kg (150 lb)           BP Readings from Last 3 Encounters:  05/21/15 166/50  05/11/15 151/78  04/15/15 154/54    Constitutional:Anxious  Neck: supple, no thyromegaly Ecchymosis noted on face and chin Respiratory: Decreased air entry bilat  Cardiovascular:Iregular Rhythm-, no murmur, no  gallop Abdomen: soft, good BS, nontender Extremities: no edema Neuro: alert and oriented, no focal motor or sensory deficits  ASSESSMENT/PLAN:  Labs and imaging studies were reviewed  1 Acute On Chronic Hypoxemic and Hypercapnic  Respiratory failure/  Diastolic CHF, Ca Lung  Possible Pneumonia Continue IV Antibiotics, Prednisone taper  and bronchodilators On 3 l N/c O2 and BIPAP at night    2  Atrial fibrillation with rapid ventricular response-likely worsened from respiratory issues. On Diltiazem and Metoprolol  Recent echo with the normal ejection fraction, diastolic dysfunction noted.  3 Hypertensive Heart disease;Continue  Metoprolol and Cardizem.  4 Diabetes mellitus-On  SSI  5 Hyperkalemia-recent admission with hyperkalemia as well. Her valsartan has been stopped - Se K 4.9- Continue to monitor   6 CKD - Se creat continues to improve with e GFR of 52  7 Anemia- Hgb -  8.3- Continue to monitor   8 Weakness;Advance diet/ Physical therapy    DVT prophylaxis-Lovenox Discussed with family- Will likely need SNF

## 2015-05-21 NOTE — Progress Notes (Signed)
Nutrition Follow-up    INTERVENTION:   Meals and Snacks: Cater to patient preferences  NUTRITION DIAGNOSIS:   Inadequate oral intake related to acute illness as evidenced by NPO status. Improving as tolerating diet with po intake >50% of meals  GOAL:   Patient will meet greater than or equal to 90% of their needs  MONITOR:    (Energy Intake, Anthropometrics, Digestive System, Electrolyte/Renal Profile, Pulmonary)  ASSESSMENT:    Pt on 3L [place], Bipap at night  Diet Order:  Diet Carb Modified Fluid consistency:: Thin; Room service appropriate?: Yes   Energy Intake: appetite good, recorded po intake 75-100% of meals  Skin:  Reviewed, no issues  Electrolyte and Renal Profile:  Recent Labs Lab 05/16/15 2155  05/19/15 0351 05/20/15 0511 05/21/15 0625  BUN 61*  < > 64* 61* 59*  CREATININE 1.83*  < > 1.23* 1.07* 1.00  NA 133*  < > 138 139 139  K 7.0*  < > 4.4 4.6 4.9  MG 2.0  --   --   --   --   PHOS 8.1*  --   --   --   --   < > = values in this interval not displayed. Glucose Profile:  Recent Labs  05/21/15 0407 05/21/15 0726 05/21/15 1143  GLUCAP 80 92 223*   Meds: ss novolog, MVI, prednisone  Height:   Ht Readings from Last 1 Encounters:  05/15/15 '5\' 4"'$  (1.626 m)    Weight:   Wt Readings from Last 1 Encounters:  05/16/15 164 lb 6.4 oz (74.571 kg)    Filed Weights   05/15/15 1150 05/16/15 1643  Weight: 165 lb (74.844 kg) 164 lb 6.4 oz (74.571 kg)    BMI:  Body mass index is 28.21 kg/(m^2).  Estimated Nutritional Needs:   Kcal:  1610-9604 kcals (BEE 1010, 1.3 AF, 1.1-1.3 IF) using IBW 55 kg  Protein:  61-72 g (1.1-1.3 g/kg)   Fluid:  1375-1650 mL (25-30 ml/kg)   LOW Care Level  Kerman Passey MS, RD, LDN [phone] Pager

## 2015-05-21 NOTE — Clinical Social Work Note (Signed)
CSW notified facility that pt would not DC today.  Possibly weekend DC to [facility] pending medical stability.  CSW will continue to follow.

## 2015-05-21 NOTE — Care Management Important Message (Signed)
Important Message  Patient Details  Name: Regina Burke MRN: [id number] Date of Birth: September 29, 1932   Medicare Important Message Given:  Yes-fourth notification given    Darius Bump Allmond 05/21/2015, 9:28 AM

## 2015-05-21 NOTE — Progress Notes (Addendum)
[facility] Progess Note    ASSESSMENT/PLAN   81 yo white female admitted for acute resp failure with acute encephalopathy(hyperpcanic and hypoxic resp failure). Extubated and doing better.    PULMONARY -Acute hypercapnic and hypoxic Respiratory Failure, due to pneumonia. Continue BiPAP nightly, is now tolerating better. -Patient will need bipap nightly at 12/6 due to hypoventilation, and likely restrictive lung disease.  -continue Bronchodilator Therapy -Continue oxygen.  -Prednisone taper.  -Pt has has a CPAP at home, but now has failure CPAP therapy due to continued hypoventilation and inability to tolerate CPAP, will change to bipap.   INFECTIOUS -On ceftriaxone. --Right sided infiltrate on CXR.   Can likely discharge soon from respiratory standpoint soon.  -Will follow peripherally over the weekend and continue seeing pt on Monday. Dr. Alva Garnet is available over the weekend should any issues arise.         ---------------------------------------   ----------------------------------------   Name: Regina Burke MRN: [id number] DOB: 1933-02-09    ADMISSION DATE:  05/15/2015  CHIEF COMPLAINT:  dyspnea  SUBJECTIVE:  No new complaints today. Patient notes she is feeling well. Feels better after wearing the bipap and feels that it is helping her.   Review of Systems:  Constitutional: Feels well. Cardiovascular: No chest pain.  Pulmonary: Denies dyspnea.   The remainder of systems were reviewed and were found to be negative other than what is documented in the HPI.    VITAL SIGNS: Temp:  [97.6 F (36.4 C)-98 F (36.7 C)] 97.6 F (36.4 C) (09/23 0411) Pulse Rate:  [62-73] 64 (09/23 0854) Resp:  [16] 16 (09/23 0411) BP: (136-170)/(45-65) 136/45 mmHg (09/23 0854) SpO2:  [96 %-100 %] 96 % (09/23 0905) HEMODYNAMICS:   VENTILATOR SETTINGS:   INTAKE / OUTPUT:  Intake/Output Summary (Last 24 hours) at 05/21/15 1118 Last data filed  at 05/21/15 0855  Gross per 24 hour  Intake    545 ml  Output   1800 ml  Net  -1255 ml    PHYSICAL EXAMINATION: Physical Examination:   VS: BP 136/45 mmHg  Pulse 64  Temp(Src) 97.6 F (36.4 C) (Oral)  Resp 16  Ht '5\' 4"'$  (1.626 m)  Wt 74.571 kg (164 lb 6.4 oz)  BMI 28.21 kg/m2  SpO2 96%  General Appearance: No distress  Neuro:without focal findings, mental status normal. HEENT: PERRLA, EOM intact. Prominent facial bruising typically above the right orbit. Pulmonary: normal breath sounds  , decreased bilaterally. CardiovascularNormal S1,S2.  No m/r/g.   Abdomen: Benign, Soft, non-tender. Renal:  No costovertebral tenderness  GU:  Not performed at this time. Endocrine: No evident thyromegaly. Skin:   warm, no rashes, no ecchymosis  Extremities: normal, no cyanosis, clubbing.   LABS:   LABORATORY PANEL:   CBC  Recent Labs Lab 05/21/15 0625  WBC 5.5  HGB 8.3*  HCT 25.4*  PLT 191    Chemistries   Recent Labs Lab 05/16/15 2155  05/17/15 0814  05/21/15 0625  NA 133*  < > 137  < > 139  K 7.0*  < > 5.0  < > 4.9  CL 98*  < > 100*  < > 96*  CO2 27  < > 27  < > 37*  GLUCOSE 344*  < > 204*  < > 104*  BUN 61*  < > 71*  < > 59*  CREATININE 1.83*  < > 1.87*  < > 1.00  CALCIUM 8.4*  < > 9.1  < > 8.5*  MG  2.0  --   --   --   --   PHOS 8.1*  --   --   --   --   AST 30  30  --  29  --   --   ALT 16  15  --  15  --   --   ALKPHOS 41  41  --  39  --   --   BILITOT 0.7  0.8  --  0.7  --   --   < > = values in this interval not displayed.   Recent Labs Lab 05/20/15 1122 05/20/15 1715 05/20/15 1947 05/20/15 2324 05/21/15 0407 05/21/15 0726  GLUCAP 188* 201* 239* 128* 80 92    Recent Labs Lab 05/17/15 0910 05/17/15 1215 05/18/15 0427  PHART 7.28* 7.33* 7.41  PCO2ART 63* 59* 54*  PO2ART 70* 66* 109*    Recent Labs Lab 05/16/15 2155 05/17/15 0814  AST '30  30 29  '$ ALT '16  15 15  '$ ALKPHOS 41  41 39  BILITOT 0.7  0.8 0.7  ALBUMIN 3.6  3.6 3.5     Cardiac Enzymes  Recent Labs Lab 05/15/15 1233  TROPONINI <0.03    RADIOLOGY:  Dg Chest 1 View  05/20/2015   CLINICAL DATA:  Shortness of breath.  EXAM: CHEST  1 VIEW  COMPARISON:  05/17/2015.  FINDINGS: Left IJ line in good anatomic position. Mediastinum hilar structures normal. Cardiomegaly with bilateral pulmonary alveolar infiltrates and pleural effusions consistent congestive heart failure. Bilateral pneumonia cannot be excluded. No pneumothorax. Surgical clips right upper chest.  IMPRESSION: 1. Lines and tubes in stable position. 2. Cardiomegaly with bilateral basilar pulmonary alveolar infiltrates and pleural effusions consistent with congestive heart failure. Bibasilar pneumonia cannot be excluded.   Electronically Signed   By: Marcello Moores  Register   On: 05/20/2015 07:15       --Marda Stalker, MD.  [facility]  Patricia Pesa, M.D.  Vilinda Boehringer, M.D.  [facility].

## 2015-05-21 NOTE — Progress Notes (Addendum)
Physical Therapy Treatment Patient Details Name: Regina Burke MRN: [id number] DOB: October 18, 1932 Today's Date: 05/21/2015    History of Present Illness Pt was here last week after a fall, went home and did well for a few days; readmitted with COPD exacerbation, Afib with RVR.  Hospital course complicated by unresponsive episode requiring transfer to CCU; emergently intubated 9/18-9/19 secondary to PNA.  Now returned to telemetry unit and stable for participation with PT.    PT Comments    Per primary RN, patient no longer requires active bedrest orders; to speak with MD and discontinue orders.  [place] for OOB this date. Patient with noted improvement in activity tolerance--able to initiate OOB and short-distance gait with RW.  Requires use of UEs for all mobility and min assist from PT for safety with all movement transitions/gait.  Fatigues quickly; unable to tolerate gait beyond 20'. Maintains sats >93% on 2L supplemental O2 throughout session.   Follow Up Recommendations  SNF     Equipment Recommendations       Recommendations for Other Services       Precautions / Restrictions Precautions Precautions: Fall Precaution Comments: Pt has fallen several times recently. Restrictions Weight Bearing Restrictions: No Other Position/Activity Restrictions: Per RN, bedrest orders no longer applicable to patient; okay for OOB.  To speak with MD and discontinue order.    Mobility  Bed Mobility Overal bed mobility: Modified Independent                Transfers Overall transfer level: Needs assistance Equipment used: Rolling walker (2 wheeled) Transfers: Sit to/from Stand Sit to Stand: Min assist         General transfer comment: requires bilat UE support to achieve lift off and standing balance  Ambulation/Gait Ambulation/Gait assistance: Min assist Ambulation Distance (Feet): 20 Feet Assistive device: Rolling walker (2 wheeled)       General Gait Details: forward  flexed posture with mod WBing bilat UEs; reciprocal stepping pattern with decreased step height/length.  Slow and effortful; fatigues quickly, limiting additional distance.   Stairs            Wheelchair Mobility    Modified Rankin (Stroke Patients Only)       Balance Overall balance assessment: Needs assistance Sitting-balance support: No upper extremity supported;Feet supported Sitting balance-Leahy Scale: Good     Standing balance support: Bilateral upper extremity supported Standing balance-Leahy Scale: Fair                      Cognition Arousal/Alertness: Awake/alert Behavior During Therapy: WFL for tasks assessed/performed Overall Cognitive Status: Within Functional Limits for tasks assessed                      Exercises Other Exercises Other Exercises: Seated LE therex, 1x12, AROM for muscular strength/endurance with functional activities: ankle pumps, LAQs, marching, hip abduct/adduct. Other Exercises: Sit/stand x3 with RW, min assist from all seating surfaces; requires UE support to complete.    General Comments        Pertinent Vitals/Pain Pain Assessment: 0-10 Pain Score: 3  Pain Location: headache Pain Descriptors / Indicators: Aching Pain Intervention(s): Limited activity within patient's tolerance;Monitored during session;Repositioned    Home Living                      Prior Function            PT Goals (current goals can now be found in the  care plan section) Acute Rehab PT Goals Patient Stated Goal: "I just want to go home" PT Goal Formulation: With patient/family Time For Goal Achievement: 06/03/15 Potential to Achieve Goals: Good Progress towards PT goals: Progressing toward goals    Frequency  Min 2X/week    PT Plan Current plan remains appropriate    Co-evaluation             End of Session Equipment Utilized During Treatment: Gait belt Activity Tolerance: Patient tolerated treatment  well Patient left: in chair;with call bell/phone within reach;with chair alarm set     Time: 4008-6761 PT Time Calculation (min) (ACUTE ONLY): 29 min  Charges:  $Gait Training: 8-22 mins $Therapeutic Exercise: 8-22 mins                    G Codes:      Janeese Mcgloin H. Owens Shark, PT, DPT, NCS 05/21/2015, 11:51 AM [phone]

## 2015-05-21 NOTE — Clinical Social Work Placement (Signed)
   CLINICAL SOCIAL WORK PLACEMENT  NOTE  Date:  05/21/2015  Patient Details  Name: Regina Burke MRN: [id number] Date of Birth: 1933-08-04  Clinical Social Work is seeking post-discharge placement for this patient at the [place] level of care (*CSW will initial, date and re-position this form in  chart as items are completed):  Yes   Patient/family provided with [place] Work Department's list of facilities offering this level of care within the geographic area requested by the patient (or if unable, by the patient's family).  Yes   Patient/family informed of their freedom to choose among providers that offer the needed level of care, that participate in Medicare, Medicaid or managed care program needed by the patient, have an available bed and are willing to accept the patient.  Yes   Patient/family informed of [facility]'s ownership interest in [facility] and [facility], as well as of the fact that they are under no obligation to receive care at these facilities.  PASRR submitted to EDS on 05/21/15     PASRR number received on 05/21/15     Existing PASRR number confirmed on       FL2 transmitted to all facilities in geographic area requested by pt/family on 05/21/15     FL2 transmitted to all facilities within larger geographic area on       Patient informed that his/her managed care company has contracts with or will negotiate with certain facilities, including the following:        Yes   Patient/family informed of bed offers received.  Patient chooses bed at  [facility])     Physician recommends and patient chooses bed at      Patient to be transferred to  [facility]) on  .  Patient to be transferred to facility by       Patient family notified on   of transfer.  Name of family member notified:        PHYSICIAN Please sign FL2     Additional Comment:     _______________________________________________ Mathews Argyle, LCSW 05/21/2015, 12:27 PM

## 2015-05-21 NOTE — Clinical Social Work Note (Signed)
Clinical Social Work Assessment  Patient Details  Name: Regina Burke MRN: [id number] Date of Birth: 11-29-1932  Date of referral:  05/21/15               Reason for consult:  Facility Placement                Permission sought to share information with:  Family Supports, Chartered certified accountant granted to share information::  Yes, Verbal Permission Granted  Name::     [place]::  [facility] SNF facilities  Relationship::     Contact Information:     Housing/Transportation Living arrangements for the past 2 months:  Single Family Home Source of Information:  Patient Patient Interpreter Needed:  None Criminal Activity/Legal Involvement Pertinent to Current Situation/Hospitalization:  No - Comment as needed Significant Relationships:  Adult Children Lives with:  Adult Children Do you feel safe going back to the place where you live?  Yes Need for family participation in patient care:  Yes (Comment)  Care giving concerns:  Pt is currently concerned about which facility she will go to for her rehab.  Pt stated that she would rather discuss it with her children first before making a decision.  CSW has left a VM with her son asking him to call CSW back about placement.  SNF packet left with pt.   Social Worker assessment / plan:  CSW spoke to pt.  She was oriented but she spoke with a very flat affect.  CSW is not able to determine if this is normal for pt or not.  CSW has reached out to pt's son for further info and is awaiting a call back.  CSW was able to get verbal permission to do a bed search for SNF placement.  CSW explained what SNF placement was.  Pt stated that she would like to talk with her family first before making any decisions, but CSW is allowed to see what is available for placement.  Pt stated that she lived with her son and daughter and normally used a cane when walking.  She also stated that she has a walker but has not used it until recently.   Pt stated that she had taked a fall and "rearranged her face instead of her hallway"  And since then she has been using her walker and cane more.  CSW will f/u once bed offers have been received.    Employment status:  Retired Forensic scientist:  Medicare PT Recommendations:  [place] / Referral to community resources:  [place]  Patient/Family's Response to care:  Pt agreed to bed search but will make a decision on going once she has had a chance to speak with her son and daughter.    Patient/Family's Understanding of and Emotional Response to Diagnosis, Current Treatment, and Prognosis:  Pt verbalized understanding about bed search and gave CSW permission to search.  CSW currently waiting to speak to family for further clarification.   Emotional Assessment Appearance:  Appears older than stated age, Disheveled, Other (Comment Required (pt had recently fallen and hasbrusing on the right side of her face, on her chin and neck.) Attitude/Demeanor/Rapport:   (pt was coorporative, but seemed confused when discussing her DC options.  CSW will contact pt's family to discuss further.) Affect (typically observed):  Flat, Other (pt spoke almost as if she was in a daze, although she was aware that she was in the hospital and would need  rehab.) Orientation:  Oriented to Self, Oriented to Place, Oriented to  Time, Oriented to Situation Alcohol / Substance use:  Never Used Psych involvement (Current and /or in the community):  No (Comment)  Discharge Needs  Concerns to be addressed:  Care Coordination Readmission within the last 30 days:  Yes Current discharge risk:  Physical Impairment Barriers to Discharge:  No Barriers Identified   Mathews Argyle, LCSW 05/21/2015, 9:21 AM

## 2015-05-21 NOTE — Progress Notes (Signed)
Patient alert and oriented x4, no complaints at this time. vss at this time. Patient NSR on telemetry. Will continue to assess. Brandi R [place]  

## 2015-05-21 NOTE — Clinical Social Work Placement (Signed)
   CLINICAL SOCIAL WORK PLACEMENT  NOTE  Date:  05/21/2015  Patient Details  Name: Regina Burke MRN: [id number] Date of Birth: 01-22-1933  Clinical Social Work is seeking post-discharge placement for this patient at the [place] level of care (*CSW will initial, date and re-position this form in  chart as items are completed):  Yes   Patient/family provided with [place] Work Department's list of facilities offering this level of care within the geographic area requested by the patient (or if unable, by the patient's family).  Yes   Patient/family informed of their freedom to choose among providers that offer the needed level of care, that participate in Medicare, Medicaid or managed care program needed by the patient, have an available bed and are willing to accept the patient.  Yes   Patient/family informed of [place]'s ownership interest in [facility] and [facility], as well as of the fact that they are under no obligation to receive care at these facilities.  PASRR submitted to EDS on 05/21/15     PASRR number received on 05/21/15     Existing PASRR number confirmed on       FL2 transmitted to all facilities in geographic area requested by pt/family on 05/21/15     FL2 transmitted to all facilities within larger geographic area on       Patient informed that his/her managed care company has contracts with or will negotiate with certain facilities, including the following:            Patient/family informed of bed offers received.  Patient chooses bed at       Physician recommends and patient chooses bed at      Patient to be transferred to   on  .  Patient to be transferred to facility by       Patient family notified on   of transfer.  Name of family member notified:        PHYSICIAN Please sign FL2     Additional Comment:  CSW will f/u once offers have been  received.  _______________________________________________ Mathews Argyle, LCSW 05/21/2015, 9:41 AM

## 2015-05-21 NOTE — Care Management (Signed)
Patient transferred to 2 from ICU late 9/22. Spoke with pulmonology.  Discussed that patient's CPAP is no longer effective at maintaining her 02 SATs and now requires BiPAP. Patient had overnight pulse oximetry on room air and patient SATs were 88% or less  for 5 min's or more but in order to qualify for BiPAP, pulse oximetry must obtained with patient on at least 2 liters of 02 continuous.  There is concern that patient needs to be monitored continuously and very closely to place patient back on bipapp should her 02 sats drop to 88 or below.  Have contacted cardiopulmonary to determine if there is a way this  testing can be done safely and awaiting call back.  Spoke with attending and he  concurs that patient needs to be closely monitored and the best way to achieve that would be outpatient sleep study setting.  In that setting, there would be staff at hand to intervene immediately.  Anticipate patient will discharge to skilled nursing 9/26

## 2015-05-22 LAB — CBC WITH DIFFERENTIAL/PLATELET
BASOS PCT: 0 %
Basophils Absolute: 0 10*3/uL (ref 0–0.1)
EOS ABS: 0 10*3/uL (ref 0–0.7)
Eosinophils Relative: 0 %
HEMATOCRIT: 27 % — AB (ref 35.0–47.0)
HEMOGLOBIN: 8.9 g/dL — AB (ref 12.0–16.0)
LYMPHS ABS: 1 10*3/uL (ref 1.0–3.6)
Lymphocytes Relative: 13 %
MCH: 25.6 pg — AB (ref 26.0–34.0)
MCHC: 33 g/dL (ref 32.0–36.0)
MCV: 77.6 fL — ABNORMAL LOW (ref 80.0–100.0)
Monocytes Absolute: 0.4 10*3/uL (ref 0.2–0.9)
Monocytes Relative: 5 %
NEUTROS ABS: 6.2 10*3/uL (ref 1.4–6.5)
NEUTROS PCT: 82 %
Platelets: 198 10*3/uL (ref 150–440)
RBC: 3.47 MIL/uL — AB (ref 3.80–5.20)
RDW: 16.1 % — ABNORMAL HIGH (ref 11.5–14.5)
WBC: 7.6 10*3/uL (ref 3.6–11.0)

## 2015-05-22 LAB — BASIC METABOLIC PANEL
Anion gap: 5 (ref 5–15)
BUN: 51 mg/dL — ABNORMAL HIGH (ref 6–20)
CHLORIDE: 95 mmol/L — AB (ref 101–111)
CO2: 38 mmol/L — AB (ref 22–32)
Calcium: 8.6 mg/dL — ABNORMAL LOW (ref 8.9–10.3)
Creatinine, Ser: 0.98 mg/dL (ref 0.44–1.00)
GFR calc non Af Amer: 53 mL/min — ABNORMAL LOW (ref >60)
Glucose, Bld: 108 mg/dL — ABNORMAL HIGH (ref 65–99)
POTASSIUM: 5 mmol/L (ref 3.5–5.1)
SODIUM: 138 mmol/L (ref 135–145)

## 2015-05-22 LAB — GLUCOSE, CAPILLARY
GLUCOSE-CAPILLARY: 118 mg/dL — AB (ref 65–99)
GLUCOSE-CAPILLARY: 270 mg/dL — AB (ref 65–99)
Glucose-Capillary: 107 mg/dL — ABNORMAL HIGH (ref 65–99)
Glucose-Capillary: 209 mg/dL — ABNORMAL HIGH (ref 65–99)
Glucose-Capillary: 295 mg/dL — ABNORMAL HIGH (ref 65–99)

## 2015-05-22 MED ORDER — DIGOXIN 0.25 MG/ML IJ SOLN
0.5000 mg | Freq: Once | INTRAMUSCULAR | Status: AC
Start: 2015-05-22 — End: 2015-05-22
  Administered 2015-05-22: 0.5 mg via INTRAVENOUS
  Filled 2015-05-22: qty 2

## 2015-05-22 NOTE — Progress Notes (Signed)
Regina Harrier, MD Physician Signed  Progress Notes 05/21/2015 8:28 AM       Regina Burke is a 81 y.o. female discharged recently after a fall- re-admitted with acute on chronic respiratory failure secondary to COPD, Pneumonia and rapid AF.  On 3 l n/c O2 and BIPAP at night States BIPAP hurts her face  Pt appears to be making slow progress and does state she is feeling better today overall Still weak and has difficulty in standing    ______________________________________________________________________  ROS: Review of systems is unremarkable for any active cardiac, GI, GU, hematologic, neurologic or psychiatric systems   . aspirin 81 mg Oral Daily  . atorvastatin 10 mg Oral Daily  . cefTRIAXone (ROCEPHIN) IV 1 g Intravenous Q24H  . cholecalciferol 400 Units Oral Daily  . clopidogrel 75 mg Oral Daily  . vitamin B-12 500 mcg Oral Daily  . diltiazem 60 mg Oral 3 times per day  . enoxaparin (LOVENOX) injection 40 mg Subcutaneous Q24H  . famotidine 20 mg Oral Daily  . febuxostat 40 mg Oral Daily  . fenofibrate 54 mg Oral Daily  . ferrous sulfate 325 mg Oral Daily  . insulin aspart 0-24 Units Subcutaneous 6 times per day  . metoprolol succinate 100 mg Oral Daily  . multivitamin-lutein 1 capsule Oral Daily  . predniSONE 30 mg Oral Q breakfast   Followed by  . [START ON 05/22/2015] predniSONE 20 mg Oral Q breakfast   Followed by  . [START ON 05/23/2015] predniSONE 10 mg Oral Q breakfast  . sertraline 25 mg Oral Daily  . sodium chloride 10-40 mL Intracatheter Q12H  . vitamin C 500 mg Oral Daily   acetaminophen **OR** acetaminophen, ALPRAZolam, docusate sodium, ipratropium-albuterol, metoprolol, ondansetron **OR** ondansetron (ZOFRAN) IV, pneumococcal 23 valent vaccine, senna, sodium chloride   Past Medical History  Diagnosis Date  . COPD  (chronic obstructive pulmonary disease)   . CHF (congestive heart failure)   . Hypertension   . Rapid palpitations 2014    Seen at [facility], may have been atrial fibrillation  . DOE (dyspnea on exertion)     Started after treatment for lung cancer  . High cholesterol   . Heart murmur   . Myocardial infarction 12/22/2014   . Lung cancer dx'd 2014    S/P radiation 2015  . Sleep apnea   . Cushing's disease   . Type II diabetes mellitus   . Anemia   . GERD (gastroesophageal reflux disease)   . History of hiatal hernia   . Migraine   . Arthritis   . Gout   . Depression   . Chronic kidney disease (CKD), stage III (moderate)     Past Surgical History  Procedure Laterality Date  . Adrenalectomy Left 1980's    "Cushings"  . Abdominal hysterectomy    . Appendectomy    . Cholecystectomy    . Coronary angioplasty with stent placement  12/23/2014  . Tonsillectomy    . Fracture surgery    . Wrist fracture surgery Bilateral ~ 2000  . Breast cyst excision Left   . Tubal ligation    . Left heart catheterization with coronary angiogram N/A 12/23/2014    Procedure: LEFT HEART CATHETERIZATION WITH CORONARY ANGIOGRAM; Surgeon: Burnell Blanks, MD; Location: [facility] CATH LAB; Service: Cardiovascular; Laterality: N/A;  . Percutaneous coronary stent intervention (pci-s)  12/23/2014    Procedure: PERCUTANEOUS CORONARY STENT INTERVENTION (PCI-S); Surgeon: Burnell Blanks, MD; Location: [facility] CATH LAB; Service: Cardiovascular;; Promus 2.25x8    PHYSICAL  EXAM:   avss                Constitutional:Anxious  Neck: supple, no thyromegaly, central line in  Ecchymosis noted on face and chin Respiratory: Decreased air entry bilat  Cardiovascular:Iregular Rhythm-, no murmur, no gallop Abdomen: soft, good BS, nontender Extremities: no edema Neuro: alert  and oriented, no focal motor or sensory deficits  ASSESSMENT/PLAN:  Labs and imaging studies were reviewed  1 Acute On Chronic Hypoxemic and Hypercapnic Respiratory failure/ Diastolic CHF, Ca Lung  Possible Pneumonia Continue IV Antibiotics, Prednisone taper and bronchodilators On 3 l N/c O2 and BIPAP at night    2 Atrial fibrillation with rapid ventricular response-likely worsened from respiratory issues. On Diltiazem and Metoprolol, recent dig toxicity noted  Recent echo with the normal ejection fraction, diastolic dysfunction noted.  3 Hypertensive Heart disease;Continue Metoprolol and Cardizem.  4 Diabetes mellitus-On SSI  5 Hyperkalemia-recent admission with hyperkalemia as well. Her valsartan has been stopped - k stable  6 CKD - Se creat continues to improve   7 Anemia- Hgb - 8.3- Continue to monitor   8 Weakness;Advance diet/ Physical therapy   DVT prophylaxis-Lovenox

## 2015-05-22 NOTE — Progress Notes (Signed)
Patient alert and oriented x4, no complaints at this time. vss at this time. Patient NSR on telemetry. Will continue to assess. Brandi R [place]  

## 2015-05-22 NOTE — Progress Notes (Signed)
F/C removed pt. Tolerated procedure well. Will continue to monitor pt.

## 2015-05-22 NOTE — Progress Notes (Signed)
Pt up to br around 11:00am  Rhythm change noted at this time. Pt in A-fib with rates 140-170. Pt requested xanax at this to help calm her. Heart rate remained unchanged. Metoprolol '5mg'$  IV given per PRN parameters. HR decreased to 120's to 160's remained in A-fib. Cardizem po given at routne time. Pt remained in a fib with RVR. Dr. Ouida Sills notified. Order or one time digoxin .'5mg'$  IV obtained. Heart rate converted to SR 70's. Rhythm changedback to A-fib within 53mnutes. Rate  Fluctuating 70's to 140's. Pt reports asymptomatic.

## 2015-05-22 NOTE — Progress Notes (Signed)
Pt. Requested Bi-pap be removed because it hurt her face. Bi-pap removed and Lyman replced at 3L. Will continue to monitor pt.

## 2015-05-22 NOTE — Progress Notes (Signed)
Pt. Request not to wear Bi-pap, pt. Educated on risk and benefits of not using Bi-pap. 3LNC continues. Will continue to monitor pt.

## 2015-05-23 LAB — GLUCOSE, CAPILLARY
GLUCOSE-CAPILLARY: 105 mg/dL — AB (ref 65–99)
GLUCOSE-CAPILLARY: 148 mg/dL — AB (ref 65–99)
GLUCOSE-CAPILLARY: 192 mg/dL — AB (ref 65–99)
GLUCOSE-CAPILLARY: 222 mg/dL — AB (ref 65–99)
GLUCOSE-CAPILLARY: 346 mg/dL — AB (ref 65–99)
Glucose-Capillary: 117 mg/dL — ABNORMAL HIGH (ref 65–99)

## 2015-05-23 NOTE — Plan of Care (Signed)
Problem: Phase II Progression Outcomes Goal: Discharge plan established Outcome: Progressing Pt is alert and oriented x 4, denies pain, c/o anxiety improved with xanax, breathing treatment given for SOB, pt up to bathroom with stand by assist and does not appear in respiratory distress, fair appetite, bm throughout shift, family at bedside, continues on 3 l oxygen, heart rate elevated at times but does not sustain, on po prednisone,  uneventful shift.

## 2015-05-23 NOTE — Progress Notes (Signed)
Regina Burke is a 81 y.o. female patient. 1. Dyspnea   2. Rapid atrial fibrillation   3. Endotracheally intubated   4. Encounter for orogastric (OG) tube placement   5. Respiratory failure    Past Medical History  Diagnosis Date  . COPD (chronic obstructive pulmonary disease)   . CHF (congestive heart failure)   . Hypertension   . Rapid palpitations 2014    Seen at [facility], may have been atrial fibrillation  . DOE (dyspnea on exertion)     Started after treatment for lung cancer  . High cholesterol   . Heart murmur   . Myocardial infarction 12/22/2014   . Lung cancer dx'd 2014    S/P radiation 2015  . Sleep apnea   . Cushing's disease   . Type II diabetes mellitus   . Anemia   . GERD (gastroesophageal reflux disease)   . History of hiatal hernia   . Migraine   . Arthritis   . Gout   . Depression   . Chronic kidney disease (CKD), stage III (moderate)    Current Facility-Administered Medications  Medication Dose Route Frequency Tevita Gomer Last Rate Last Dose  . acetaminophen (TYLENOL) tablet 650 mg  650 mg Oral Q6H PRN Gladstone Lighter, MD   650 mg at 05/22/15 2138   Or  . acetaminophen (TYLENOL) suppository 650 mg  650 mg Rectal Q6H PRN Gladstone Lighter, MD      . ALPRAZolam Duanne Moron) tablet 0.25 mg  0.25 mg Oral TID PRN Laverle Hobby, MD   0.25 mg at 05/23/15 1040  . aspirin chewable tablet 81 mg  81 mg Oral Daily Charlett Nose, RPH   81 mg at 05/23/15 1022  . atorvastatin (LIPITOR) tablet 10 mg  10 mg Oral Daily Gladstone Lighter, MD   10 mg at 05/23/15 1023  . cefTRIAXone (ROCEPHIN) 1 g in dextrose 5 % 50 mL IVPB  1 g Intravenous Q24H Vishwanath Hande, MD   1 g at 05/23/15 1023  . cholecalciferol (VITAMIN D) tablet 400 Units  400 Units Oral Daily Gladstone Lighter, MD   400 Units at 05/23/15 1023  . clopidogrel (PLAVIX) tablet 75 mg  75 mg Oral Daily Gladstone Lighter, MD   75 mg at 05/23/15 1022  . cyanocobalamin tablet 500 mcg  500 mcg Oral Daily Lenis Noon,  RPH   500 mcg at 05/23/15 1022  . diltiazem (CARDIZEM) tablet 60 mg  60 mg Oral 3 times per day Tracie Harrier, MD   60 mg at 05/23/15 1023  . docusate sodium (COLACE) capsule 100 mg  100 mg Oral BID PRN Gladstone Lighter, MD   100 mg at 05/19/15 1613  . enoxaparin (LOVENOX) injection 40 mg  40 mg Subcutaneous Q24H Vishwanath Hande, MD   40 mg at 05/22/15 2139  . famotidine (PEPCID) tablet 20 mg  20 mg Oral Daily Laverle Hobby, MD   20 mg at 05/23/15 1022  . febuxostat (ULORIC) tablet 40 mg  40 mg Oral Daily Gladstone Lighter, MD   40 mg at 05/23/15 1022  . fenofibrate tablet 54 mg  54 mg Oral Daily Gladstone Lighter, MD   54 mg at 05/23/15 1022  . ferrous sulfate tablet 325 mg  325 mg Oral Daily Gladstone Lighter, MD   325 mg at 05/23/15 1022  . insulin aspart (novoLOG) injection 0-24 Units  0-24 Units Subcutaneous 6 times per day Tracie Harrier, MD   2 Units at 05/23/15 0052  . ipratropium-albuterol (DUONEB) 0.5-2.5 (3)  MG/3ML nebulizer solution 3 mL  3 mL Nebulization Q4H PRN Gladstone Lighter, MD   3 mL at 05/23/15 1040  . metoprolol (LOPRESSOR) injection 5 mg  5 mg Intravenous Q4H PRN Gladstone Lighter, MD   5 mg at 05/22/15 1257  . metoprolol succinate (TOPROL-XL) 24 hr tablet 100 mg  100 mg Oral Daily Gladstone Lighter, MD   100 mg at 05/23/15 1023  . multivitamin-lutein (OCUVITE-LUTEIN) capsule 1 capsule  1 capsule Oral Daily Lenis Noon, [facility]   1 capsule at 05/23/15 1023  . ondansetron (ZOFRAN) tablet 4 mg  4 mg Oral Q6H PRN Gladstone Lighter, MD       Or  . ondansetron (ZOFRAN) injection 4 mg  4 mg Intravenous Q6H PRN Gladstone Lighter, MD      . pneumococcal 23 valent vaccine (PNU-IMMUNE) injection 0.5 mL  0.5 mL Intramuscular Prior to discharge Tracie Harrier, MD      . senna (SENOKOT) tablet 8.6 mg  1 tablet Oral Daily PRN Gladstone Lighter, MD   8.6 mg at 05/23/15 1023  . sertraline (ZOLOFT) tablet 25 mg  25 mg Oral Daily Gladstone Lighter, MD   25 mg at 05/23/15 1023   . sodium chloride 0.9 % injection 10-40 mL  10-40 mL Intracatheter Q12H Vishwanath Hande, MD   10 mL at 05/22/15 2200  . sodium chloride 0.9 % injection 10-40 mL  10-40 mL Intracatheter PRN Vishwanath Hande, MD      . vitamin C (ASCORBIC ACID) tablet 500 mg  500 mg Oral Daily Gladstone Lighter, MD   500 mg at 05/23/15 1023   Allergies  Allergen Reactions  . Ciprofloxacin Shortness Of Breath, Itching and Rash  . Doxycycline Shortness Of Breath, Itching and Rash  . Penicillins Shortness Of Breath, Itching and Rash  . Sulfa Antibiotics Shortness Of Breath, Itching and Rash  . Morphine And Related Itching   Active Problems:   A-fib   Acute respiratory failure  Blood pressure 132/69, pulse 90, temperature 97.6 F (36.4 C), temperature source Oral, resp. rate 18, height '5\' 4"'$  (1.626 m), weight 74.571 kg (164 lb 6.4 oz), SpO2 100 %.  Subjective  Regina Burke is a 81 y.o. female discharged recently after a fall- re-admitted with acute on chronic respiratory failure secondary to COPD, Pneumonia and rapid AF. Breathing easier today, afib was rapid yesterday afternoon but controlled with iv dig and iv lopressor.  On 3 l n/c O2 and BIPAP at night States BIPAP hurts her face  Pt appears to be making slow progress and does state she is feeling better today overall, anxious to move to rehab soon No nvd nofcs  Objective  Constitutional:Anxious  Neck: supple, no thyromegaly, central line in  Ecchymosis noted on face and chin Respiratory: Decreased air entry bilat  Cardiovascular:Iregular Rhythm-, no murmur, no gallop Abdomen: soft, good BS, nontender Extremities: no edema Neuro: alert and oriented, no focal motor or sensory deficits Assessment & Plan 1 Acute On Chronic Hypoxemic and Hypercapnic Respiratory failure/ Diastolic CHF, Ca Lung  Possible Pneumonia  Continue IV Antibiotics, Prednisone taper and bronchodilators On 3 l N/c O2 and BIPAP at night when she can tolerat   2  Atrial fibrillation with rapid ventricular response-likely worsened from respiratory issues. On Diltiazem and Metoprolol, careful with further dig given toxicity in the past but repsonded to one dose yesterday afternoon  Recent echo with the normal ejection fraction, diastolic dysfunction noted.  3 Hypertensive Heart disease;Continue Metoprolol and Cardizem. And follow  4 Diabetes  mellitus-On SSI  5 Hyperkalemia-recent admission with hyperkalemia as well. Her valsartan has been stopped - k stable  6 CKD - Se creat continues to improve   7 Anemia- stable  8 Weakness;Advance diet/ Physical therapy Signed Kirk Ruths. 05/23/2015

## 2015-05-23 NOTE — Progress Notes (Signed)
Pt. Request not to wear Bi-pap, pt. Educated on risk and benefits of not using Bi-pap. 3LNC continues. Will continue to monitor pt.

## 2015-05-24 LAB — GLUCOSE, CAPILLARY
GLUCOSE-CAPILLARY: 101 mg/dL — AB (ref 65–99)
GLUCOSE-CAPILLARY: 125 mg/dL — AB (ref 65–99)
GLUCOSE-CAPILLARY: 82 mg/dL (ref 65–99)
Glucose-Capillary: 174 mg/dL — ABNORMAL HIGH (ref 65–99)

## 2015-05-24 MED ORDER — IPRATROPIUM-ALBUTEROL 0.5-2.5 (3) MG/3ML IN SOLN
3.0000 mL | RESPIRATORY_TRACT | Status: DC | PRN
Start: 2015-05-24 — End: 2015-07-13

## 2015-05-24 MED ORDER — DOCUSATE SODIUM 100 MG PO CAPS: 100.0000 mg | ORAL_CAPSULE | Freq: Two times a day (BID) | ORAL | Status: DC | PRN

## 2015-05-24 MED ORDER — FAMOTIDINE 20 MG PO TABS: 20.0000 mg | ORAL_TABLET | Freq: Every day | ORAL | Status: DC

## 2015-05-24 MED ORDER — SENNA 8.6 MG PO TABS: 1.0000 | ORAL_TABLET | Freq: Every day | ORAL | Status: DC | PRN

## 2015-05-24 MED ORDER — ATORVASTATIN CALCIUM 10 MG PO TABS: 10.0000 mg | ORAL_TABLET | Freq: Every day | ORAL | Status: DC

## 2015-05-24 MED ORDER — ALPRAZOLAM 0.25 MG PO TABS: 0.2500 mg | ORAL_TABLET | Freq: Three times a day (TID) | ORAL | Status: DC | PRN

## 2015-05-24 NOTE — Care Management Important Message (Signed)
Important Message  Patient Details  Name: Regina Burke MRN: [id number] Date of Birth: November 20, 1932   Medicare Important Message Given:  Yes-second notification given    Darius Bump Allmond 05/24/2015, 9:29 AM

## 2015-05-24 NOTE — Clinical Social Work Note (Signed)
Patient to discharge today to [facility]. Patient is aware and in agreement and patient's son is aware and in agreement. Patient requested transport via EMS. Discharge summary sent to [facility] today. Shela Leff MSW,LCSW [phone]

## 2015-05-24 NOTE — Progress Notes (Signed)
EMS called re non emergent transport to [facility].

## 2015-05-24 NOTE — Progress Notes (Signed)
Pt discharged to [facility] via EMS.  Instructions and rx given to pt.  Questions answered.  No distress.

## 2015-05-24 NOTE — Progress Notes (Signed)
Checked to see if pre-authorization would be needed for non-emergent EMS transport. Per [employer] benefits obtained online through [employer], patient has a [employer] PPO policy.  Medicare  Medicare PPO plans do not require pre-auth for non-emergent ground transports using service codes [phone] or [phone].

## 2015-05-24 NOTE — Clinical Social Work Note (Signed)
It was unknown to this covering CSW that patient was on a bipap at night and would need to go to [facility] on bipap. [facility] came by and I reviewed the bipap settings that the patient was placed on by pulmonology and Broadus John to make arrangements to have bipap for patient this evening. Shela Leff MSW,LCSW [phone]

## 2015-05-24 NOTE — Discharge Summary (Signed)
Physician Discharge Summary  Regina Burke QMV:[id number] DOB: [birth year] DOA: 05/15/2015  PCP: Tracie Harrier, MD  Admit date: 05/15/2015 Discharge date: 05/24/2015  Time spent: 64mnutes  Recommendations for Outpatient Follow-up:  [facility] Call office to make appt in 2-3  weeks    Discharge Diagnoses:  1 Acute  on Chronic  Hypercapnic and Hypoxemic  Respiratory failure 2 Paroxysmal A-fib 3 COPD 4 Ca Lung 5 Type 2 DM. 6 Chronic Anemia 7 Anxiety    Discharge Condition: Stable  Diet recommendation: 1800 cals  Filed Weights   05/15/15 1150 05/16/15 1643  Weight: 74.844 kg (165 lb) 74.571 kg (164 lb 6.4 oz)    History of present illness:  Regina Villalonis a 81year old female with a history of COPD chronic respiratory failure with the O2 at 3 L nasal cannula, CAD status post stent diastolic CHF, Sleep apnea, Type 2 diabetes, Hypertension and CKD-, admitted with worsening shortness of breath with the use of accessory muscles of breathing patient had been recently admitted after a fall and had sustained extensive facial injuries patient also been admitted prior to that with digitoxicity. Chest x-ray was suggestive pneumonia. Patient was initially admitted to the CCU and subsequently transferred to the floor   Hospital Course:  During her stay in hospital patient had an episode of respiratory failure was transferred back to CCU and intubated. With antibiotics and supplemental oxygen nebulized broncho-dilator therapy and IV Solu-Medrol patient gradually improved. Patient was tapered off her steroids.Her renal function improved  She appeared to do well on BiPAP at night  and oxygen at 3 L nasal calendar during the day. Patient still was still weak  and was discharged stable condition to skilled nursing facility .    Consultations: Dr. PLaverle Hobby(Pulmonary)  Discharge Exam: Filed Vitals:   05/24/15 0756  BP: 117/54  Pulse: 80  Temp: 98.4 F (36.9  C)  Resp: 18    General: Weak. Ecchymosis on face and hematoma over Rt fore head Cardiovascular: S1 S2 Respiratory: Decrease air entry bilat  Discharge Instructions   Discharge Instructions    Diet - low sodium heart healthy    Complete by:  As directed      Discharge instructions    Complete by:  As directed   D/c to SNF today Call office to make appt in 1-2 weeks          Current Discharge Medication List    START taking these medications   Details  docusate sodium (COLACE) 100 MG capsule Take 1 capsule (100 mg total) by mouth 2 (two) times daily as needed for mild constipation. Qty: 10 capsule, Refills: 0    famotidine (PEPCID) 20 MG tablet Take 1 tablet (20 mg total) by mouth daily. Qty: 30 tablet, Refills: 3    ipratropium-albuterol (DUONEB) 0.5-2.5 (3) MG/3ML SOLN Take 3 mLs by nebulization every 4 (four) hours as needed. Qty: 360 mL, Refills: 3    senna (SENOKOT) 8.6 MG TABS tablet Take 1 tablet (8.6 mg total) by mouth daily as needed for mild constipation. Qty: 120 each, Refills: 0      CONTINUE these medications which have CHANGED   Details  ALPRAZolam (XANAX) 0.25 MG tablet Take 1 tablet (0.25 mg total) by mouth 3 (three) times daily as needed for anxiety. Qty: 30 tablet, Refills: 0      CONTINUE these medications which have NOT CHANGED   Details  aspirin EC 81 MG tablet Take 1 tablet (81 mg total) by mouth  daily. Qty: 90 tablet, Refills: 3    atorvastatin (LIPITOR) 10 MG tablet Take 10 mg by mouth daily.    cholecalciferol (VITAMIN D) 400 UNITS TABS tablet Take 400 Units by mouth daily.    clopidogrel (PLAVIX) 75 MG tablet Take 1 tablet (75 mg total) by mouth daily. Qty: 30 tablet, Refills: 11    Cyanocobalamin (VITAMIN B-12) 500 MCG SUBL Place 500 mcg under the tongue daily.    digoxin (DIGITEK) 0.125 MG tablet Take 0.125 mg by mouth daily.    diltiazem (DILACOR XR) 180 MG 24 hr capsule Take 180 mg by mouth daily.    febuxostat (ULORIC) 40 MG  tablet Take 40 mg by mouth daily.     fenofibrate (TRICOR) 48 MG tablet Take 48 mg by mouth daily.    ferrous sulfate 325 (65 FE) MG EC tablet Take 325 mg by mouth daily.     metFORMIN (GLUCOPHAGE) 500 MG tablet Take 500 mg by mouth 2 (two) times daily with a meal.    metoprolol succinate (TOPROL-XL) 100 MG 24 hr tablet Take 1 tablet (100 mg total) by mouth daily. Take with or immediately following a meal. Qty: 30 tablet, Refills: 11    Multiple Vitamins-Minerals (PRESERVISION AREDS 2) CAPS Take 1 capsule by mouth daily.    nitroGLYCERIN (NITROSTAT) 0.4 MG SL tablet Place 1 tablet (0.4 mg total) under the tongue every 5 (five) minutes as needed for chest pain. Qty: 25 tablet, Refills: 3    sertraline (ZOLOFT) 25 MG tablet Take 25 mg by mouth daily.    vitamin C (ASCORBIC ACID) 500 MG tablet Take 500 mg by mouth daily.      STOP taking these medications     ibuprofen (ADVIL,MOTRIN) 200 MG tablet      naproxen sodium (ALEVE) 220 MG tablet      pantoprazole (PROTONIX) 40 MG tablet        Allergies  Allergen Reactions  . Ciprofloxacin Shortness Of Breath, Itching and Rash  . Doxycycline Shortness Of Breath, Itching and Rash  . Penicillins Shortness Of Breath, Itching and Rash  . Sulfa Antibiotics Shortness Of Breath, Itching and Rash  . Morphine And Related Itching      The results of significant diagnostics from this hospitalization (including imaging, microbiology, ancillary and laboratory) are listed below for reference.    Significant Diagnostic Studies: Dg Chest 1 View  05/20/2015   CLINICAL DATA:  Shortness of breath.  EXAM: CHEST  1 VIEW  COMPARISON:  05/17/2015.  FINDINGS: Left IJ line in good anatomic position. Mediastinum hilar structures normal. Cardiomegaly with bilateral pulmonary alveolar infiltrates and pleural effusions consistent congestive heart failure. Bilateral pneumonia cannot be excluded. No pneumothorax. Surgical clips right upper chest.  IMPRESSION:  1. Lines and tubes in stable position. 2. Cardiomegaly with bilateral basilar pulmonary alveolar infiltrates and pleural effusions consistent with congestive heart failure. Bibasilar pneumonia cannot be excluded.   Electronically Signed   By: Marcello Moores  Register   On: 05/20/2015 07:15   Dg Abd 1 View  05/16/2015   CLINICAL DATA:  Encounter for orogastric tube placement.  EXAM: ABDOMEN - 1 VIEW  COMPARISON:  None.  FINDINGS: Tip of the orogastric tube extends well below the left hemidiaphragm consistent with it being in the mid stomach.  There are surgical vascular clips in left upper quadrant.  Normal bowel gas pattern.  IMPRESSION: Orogastric tube tip projects within the mid stomach.   Electronically Signed   By: Dedra Skeens.D.  On: 05/16/2015 21:52   Ct Head Wo Contrast  05/08/2015   CLINICAL DATA:  Tripped and fell, facial trauma, right periorbital hematoma and epistaxis. On Plavix and aspirin.  EXAM: CT HEAD WITHOUT CONTRAST  CT MAXILLOFACIAL WITHOUT CONTRAST  CT CERVICAL SPINE WITHOUT CONTRAST  TECHNIQUE: Multidetector CT imaging of the head, cervical spine, and maxillofacial structures were performed using the standard protocol without intravenous contrast. Multiplanar CT image reconstructions of the cervical spine and maxillofacial structures were also generated.  COMPARISON:  PET-CT 06/01/2014.  No recent similar comparison exam.  FINDINGS: CT HEAD FINDINGS  Findings referable to the face are described in detail below.  Diffuse cortical volume loss with proportional ventricular prominence. No acute hemorrhage, infarct, or mass lesion is identified. No midline shift. Right frontal scalp hematoma and presumed evidence of previous right frontoparietal burr hole. No skull fracture involving the cranium.  CT MAXILLOFACIAL FINDINGS  Large right periorbital hematoma. Retrobulbar are fat is clean. Comminuted nasal bone fractures with associated gas and gas/fluid within the right nasopharynx. Paranasal  sinuses are intact. Mandibular condyles are properly located. Globes are grossly unremarkable. Patient is partly edentulous. Zygomatic arches are intact.  CT CERVICAL SPINE FINDINGS  C1 through the cervicothoracic junction is visualized in its entirety. No precervical soft tissue widening. Mild loss of the normal cervical lordosis is noted centered at C5-C6. Minimal posterior spurring at this level is identified related to disc degenerative change. Vertebral body heights are maintained. No fracture or dislocation. Multilevel facet osteoarthritic change. Right upper lobe spiculated mass partly visualized, not further evaluated compared to previous diagnostic exam but subjectively increased from 06/01/2014 exam.  IMPRESSION: Large right periorbital soft tissue hematoma with comminuted nasal bone fractures.  No acute intracranial abnormality.  No acute cervical spine abnormality.  Incomplete imaging of the lung apices demonstrates a partly imaged spiculated right upper lobe mass as seen on the prior exam and compatible with the history of lung cancer but not further specifically evaluated today, and could represent progression of disease although radiation fibrosis or other entities could appear similar. Outpatient nonemergent restaging is recommended.   Electronically Signed   By: Conchita Paris M.D.   On: 05/08/2015 09:59   Ct Cervical Spine Wo Contrast  05/08/2015   CLINICAL DATA:  Tripped and fell, facial trauma, right periorbital hematoma and epistaxis. On Plavix and aspirin.  EXAM: CT HEAD WITHOUT CONTRAST  CT MAXILLOFACIAL WITHOUT CONTRAST  CT CERVICAL SPINE WITHOUT CONTRAST  TECHNIQUE: Multidetector CT imaging of the head, cervical spine, and maxillofacial structures were performed using the standard protocol without intravenous contrast. Multiplanar CT image reconstructions of the cervical spine and maxillofacial structures were also generated.  COMPARISON:  PET-CT 06/01/2014.  No recent similar comparison  exam.  FINDINGS: CT HEAD FINDINGS  Findings referable to the face are described in detail below.  Diffuse cortical volume loss with proportional ventricular prominence. No acute hemorrhage, infarct, or mass lesion is identified. No midline shift. Right frontal scalp hematoma and presumed evidence of previous right frontoparietal burr hole. No skull fracture involving the cranium.  CT MAXILLOFACIAL FINDINGS  Large right periorbital hematoma. Retrobulbar are fat is clean. Comminuted nasal bone fractures with associated gas and gas/fluid within the right nasopharynx. Paranasal sinuses are intact. Mandibular condyles are properly located. Globes are grossly unremarkable. Patient is partly edentulous. Zygomatic arches are intact.  CT CERVICAL SPINE FINDINGS  C1 through the cervicothoracic junction is visualized in its entirety. No precervical soft tissue widening. Mild loss of the normal cervical lordosis  is noted centered at C5-C6. Minimal posterior spurring at this level is identified related to disc degenerative change. Vertebral body heights are maintained. No fracture or dislocation. Multilevel facet osteoarthritic change. Right upper lobe spiculated mass partly visualized, not further evaluated compared to previous diagnostic exam but subjectively increased from 06/01/2014 exam.  IMPRESSION: Large right periorbital soft tissue hematoma with comminuted nasal bone fractures.  No acute intracranial abnormality.  No acute cervical spine abnormality.  Incomplete imaging of the lung apices demonstrates a partly imaged spiculated right upper lobe mass as seen on the prior exam and compatible with the history of lung cancer but not further specifically evaluated today, and could represent progression of disease although radiation fibrosis or other entities could appear similar. Outpatient nonemergent restaging is recommended.   Electronically Signed   By: Conchita Paris M.D.   On: 05/08/2015 09:59   Dg Chest Port 1  View  05/17/2015   CLINICAL DATA:  Respiratory failure.  EXAM: PORTABLE CHEST - 1 VIEW  COMPARISON:  None.  FINDINGS: Endotracheal tube, NG line, left IJ line in stable position. Cardiomegaly with pulmonary vascular prominence and bilateral interstitial prominence consistent with congestive heart failure. Interval partial clearing of pulmonary interstitial edema. Small bilateral pleural effusions. Surgical clips right upper abdomen.  IMPRESSION: 1. Lines and tubes in stable position. 2. Congestive heart failure with partial clearing of bilateral pulmonary interstitial edema. Small bilateral pleural effusions   Electronically Signed   By: [place]   On: 05/17/2015 08:54   Dg Chest Port 1 View  05/16/2015   CLINICAL DATA:  Post intubation.  Central line placement.  EXAM: PORTABLE CHEST - 1 VIEW  COMPARISON:  05/15/2015  FINDINGS: Endotracheal tube placed with tip measuring 3.6 cm above the carina. Enteric tube tip is off the field of view but below the left hemidiaphragm. Left central venous catheter tip is over the low SVC region. No pneumothorax. Normal heart size and pulmonary vascularity. Some prominence of the central pulmonary arteries. Diffuse infiltration in the right lung increasing since previous study. This could be due to pneumonia or asymmetrical edema. Probable small bilateral pleural effusions and basilar atelectasis. Calcified and tortuous aorta. Surgical clips in the right upper chest and upper abdomen.  IMPRESSION: Appliances appear in satisfactory position. Increasing infiltration in the right lung suggesting pneumonia or asymmetric edema. Small bilateral pleural effusions with basilar atelectasis.   Electronically Signed   By: Lucienne Capers M.D.   On: 05/16/2015 21:48   Dg Chest Port 1 View  05/15/2015   CLINICAL DATA:  Fall 1 week ago, shortness of Breath  EXAM: PORTABLE CHEST - 1 VIEW  COMPARISON:  12/22/2014  FINDINGS: Cardiomediastinal silhouette is stable. Central mild  vascular joint without pulmonary edema. Small bilateral pleural effusion with bilateral basilar atelectasis or infiltrate. No pneumothorax.  IMPRESSION: Small bilateral pleural effusion with bilateral basilar atelectasis or infiltrate. No pulmonary edema. No pneumothorax.   Electronically Signed   By: Lahoma Crocker M.D.   On: 05/15/2015 12:40   Ct Maxillofacial Wo Cm  05/08/2015   CLINICAL DATA:  Tripped and fell, facial trauma, right periorbital hematoma and epistaxis. On Plavix and aspirin.  EXAM: CT HEAD WITHOUT CONTRAST  CT MAXILLOFACIAL WITHOUT CONTRAST  CT CERVICAL SPINE WITHOUT CONTRAST  TECHNIQUE: Multidetector CT imaging of the head, cervical spine, and maxillofacial structures were performed using the standard protocol without intravenous contrast. Multiplanar CT image reconstructions of the cervical spine and maxillofacial structures were also generated.  COMPARISON:  PET-CT 06/01/2014.  No recent similar comparison exam.  FINDINGS: CT HEAD FINDINGS  Findings referable to the face are described in detail below.  Diffuse cortical volume loss with proportional ventricular prominence. No acute hemorrhage, infarct, or mass lesion is identified. No midline shift. Right frontal scalp hematoma and presumed evidence of previous right frontoparietal burr hole. No skull fracture involving the cranium.  CT MAXILLOFACIAL FINDINGS  Large right periorbital hematoma. Retrobulbar are fat is clean. Comminuted nasal bone fractures with associated gas and gas/fluid within the right nasopharynx. Paranasal sinuses are intact. Mandibular condyles are properly located. Globes are grossly unremarkable. Patient is partly edentulous. Zygomatic arches are intact.  CT CERVICAL SPINE FINDINGS  C1 through the cervicothoracic junction is visualized in its entirety. No precervical soft tissue widening. Mild loss of the normal cervical lordosis is noted centered at C5-C6. Minimal posterior spurring at this level is identified related to  disc degenerative change. Vertebral body heights are maintained. No fracture or dislocation. Multilevel facet osteoarthritic change. Right upper lobe spiculated mass partly visualized, not further evaluated compared to previous diagnostic exam but subjectively increased from 06/01/2014 exam.  IMPRESSION: Large right periorbital soft tissue hematoma with comminuted nasal bone fractures.  No acute intracranial abnormality.  No acute cervical spine abnormality.  Incomplete imaging of the lung apices demonstrates a partly imaged spiculated right upper lobe mass as seen on the prior exam and compatible with the history of lung cancer but not further specifically evaluated today, and could represent progression of disease although radiation fibrosis or other entities could appear similar. Outpatient nonemergent restaging is recommended.   Electronically Signed   By: Conchita Paris M.D.   On: 05/08/2015 09:59    Microbiology: Recent Results (from the past 240 hour(s))  MRSA PCR Screening     Status: None   Collection Time: 05/15/15  6:10 PM  Result Value Ref Range Status   MRSA by PCR NEGATIVE NEGATIVE Final    Comment:        The GeneXpert MRSA Assay (FDA approved for NASAL specimens only), is one component of a comprehensive MRSA colonization surveillance program. It is not intended to diagnose MRSA infection nor to guide or monitor treatment for MRSA infections.   Culture, expectorated sputum-assessment     Status: None   Collection Time: 05/17/15 12:11 PM  Result Value Ref Range Status   Specimen Description EXPECTORATED SPUTUM  Final   Special Requests NONE  Final   Sputum evaluation THIS SPECIMEN IS ACCEPTABLE FOR SPUTUM CULTURE  Final   Report Status 05/17/2015 FINAL  Final  Culture, respiratory (NON-Expectorated)     Status: None   Collection Time: 05/17/15 12:11 PM  Result Value Ref Range Status   Specimen Description EXPECTORATED SPUTUM  Final   Special Requests NONE Reflexed from  [id number]  Final   Gram Stain   Final    GOOD SPECIMEN - 80-90% WBCS MODERATE WBC SEEN MANY GRAM POSITIVE COCCI    Culture APPEARS TO NORMAL FLORA  Final   Report Status 05/20/2015 FINAL  Final  MRSA PCR Screening     Status: None   Collection Time: 05/17/15  3:15 PM  Result Value Ref Range Status   MRSA by PCR NEGATIVE NEGATIVE Final    Comment:        The GeneXpert MRSA Assay (FDA approved for NASAL specimens only), is one component of a comprehensive MRSA colonization surveillance program. It is not intended to diagnose MRSA infection nor to guide or monitor treatment for MRSA infections.  Labs: Basic Metabolic Panel:  Recent Labs Lab 05/18/15 0615 05/19/15 0351 05/20/15 0511 05/21/15 0625 05/22/15 0405  NA 138 138 139 139 138  K 4.6 4.4 4.6 4.9 5.0  CL 99* 98* 96* 96* 95*  CO2 31 32 36* 37* 38*  GLUCOSE 176* 147* 171* 104* 108*  BUN 72* 64* 61* 59* 51*  CREATININE 1.51* 1.23* 1.07* 1.00 0.98  CALCIUM 8.8* 8.5* 8.7* 8.5* 8.6*   Liver Function Tests: No results for input(s): AST, ALT, ALKPHOS, BILITOT, PROT, ALBUMIN in the last 168 hours. No results for input(s): LIPASE, AMYLASE in the last 168 hours. No results for input(s): AMMONIA in the last 168 hours. CBC:  Recent Labs Lab 05/18/15 0515 05/19/15 0351 05/20/15 0511 05/21/15 0625 05/22/15 0405  WBC 12.0* 8.4 7.2 5.5 7.6  NEUTROABS 11.6* 8.0* 6.3 4.3 6.2  HGB 8.4* 8.5* 8.8* 8.3* 8.9*  HCT 25.8* 25.6* 26.7* 25.4* 27.0*  MCV 79.0* 77.9* 78.6* 78.3* 77.6*  PLT 219 197 206 191 198   Cardiac Enzymes: No results for input(s): CKTOTAL, CKMB, CKMBINDEX, TROPONINI in the last 168 hours. BNP: BNP (last 3 results)  Recent Labs  12/23/14 0434 05/15/15 1233  BNP 213.8* 560.0*    ProBNP (last 3 results) No results for input(s): PROBNP in the last 8760 hours.  CBG:  Recent Labs Lab 05/23/15 1615 05/23/15 1947 05/24/15 05/24/15 0355 05/24/15 0727  GLUCAP 192* 346* 125* 82 101*        Signed:  HANDE,VISHWANATH   05/24/2015, 8:28 AM

## 2015-05-24 NOTE — Progress Notes (Signed)
CVC lt neck removed for discharged.  Pressure held x 10 minutes, dry pressure dressing with tegaderm applied.  Pt to lay flat 30 minutes, pt verbalizes understanding.

## 2015-05-24 NOTE — Progress Notes (Signed)
Regina Burke is a 81 y.o. female patient.   This am states she is feeling better. Has episode of A-fib over the week end and needed IV Dig and Lopressor               Past Medical History  Diagnosis Date  . COPD (chronic obstructive pulmonary disease)   . CHF (congestive heart failure)   . Hypertension   . Rapid palpitations 2014    Seen at [facility], may have been atrial fibrillation  . DOE (dyspnea on exertion)     Started after treatment for lung cancer  . High cholesterol   . Heart murmur   . Myocardial infarction 12/22/2014   . Lung cancer dx'd 2014    S/P radiation 2015  . Sleep apnea   . Cushing's disease   . Type II diabetes mellitus   . Anemia   . GERD (gastroesophageal reflux disease)   . History of hiatal hernia   . Migraine   . Arthritis   . Gout   . Depression   . Chronic kidney disease (CKD), stage III (moderate)    Current Facility-Administered Medications  Medication Dose Route Frequency Provider Last Rate Last Dose  . acetaminophen (TYLENOL) tablet 650 mg  650 mg Oral Q6H PRN Gladstone Lighter, MD   650 mg at 05/23/15 2027   Or  . acetaminophen (TYLENOL) suppository 650 mg  650 mg Rectal Q6H PRN Gladstone Lighter, MD      . ALPRAZolam Duanne Moron) tablet 0.25 mg  0.25 mg Oral TID PRN Laverle Hobby, MD   0.25 mg at 05/23/15 2028  . aspirin chewable tablet 81 mg  81 mg Oral Daily Charlett Nose, RPH   81 mg at 05/23/15 1022  . atorvastatin (LIPITOR) tablet 10 mg  10 mg Oral Daily Gladstone Lighter, MD   10 mg at 05/23/15 1023  . cefTRIAXone (ROCEPHIN) 1 g in dextrose 5 % 50 mL IVPB  1 g Intravenous Q24H Vishwanath Hande, MD   1 g at 05/23/15 1023  . cholecalciferol (VITAMIN D) tablet 400 Units  400 Units Oral Daily Gladstone Lighter, MD   400 Units at 05/23/15 1023  . clopidogrel (PLAVIX) tablet 75 mg  75 mg Oral Daily Gladstone Lighter, MD   75 mg at 05/23/15 1022  . cyanocobalamin tablet 500 mcg  500 mcg Oral Daily Lenis Noon, RPH   500 mcg at  05/23/15 1022  . diltiazem (CARDIZEM) tablet 60 mg  60 mg Oral 3 times per day Tracie Harrier, MD   60 mg at 05/24/15 0456  . docusate sodium (COLACE) capsule 100 mg  100 mg Oral BID PRN Gladstone Lighter, MD   100 mg at 05/19/15 1613  . enoxaparin (LOVENOX) injection 40 mg  40 mg Subcutaneous Q24H Vishwanath Hande, MD   40 mg at 05/23/15 2029  . famotidine (PEPCID) tablet 20 mg  20 mg Oral Daily Laverle Hobby, MD   20 mg at 05/23/15 1022  . febuxostat (ULORIC) tablet 40 mg  40 mg Oral Daily Gladstone Lighter, MD   40 mg at 05/23/15 1022  . fenofibrate tablet 54 mg  54 mg Oral Daily Gladstone Lighter, MD   54 mg at 05/23/15 1022  . ferrous sulfate tablet 325 mg  325 mg Oral Daily Gladstone Lighter, MD   325 mg at 05/23/15 1022  . insulin aspart (novoLOG) injection 0-24 Units  0-24 Units Subcutaneous 6 times per day Tracie Harrier, MD   2 Units at 05/24/15  0020  . ipratropium-albuterol (DUONEB) 0.5-2.5 (3) MG/3ML nebulizer solution 3 mL  3 mL Nebulization Q4H PRN Gladstone Lighter, MD   3 mL at 05/23/15 2035  . metoprolol (LOPRESSOR) injection 5 mg  5 mg Intravenous Q4H PRN Gladstone Lighter, MD   5 mg at 05/22/15 1257  . metoprolol succinate (TOPROL-XL) 24 hr tablet 100 mg  100 mg Oral Daily Gladstone Lighter, MD   100 mg at 05/23/15 1023  . multivitamin-lutein (OCUVITE-LUTEIN) capsule 1 capsule  1 capsule Oral Daily Lenis Noon, [facility]   1 capsule at 05/23/15 1023  . ondansetron (ZOFRAN) tablet 4 mg  4 mg Oral Q6H PRN Gladstone Lighter, MD       Or  . ondansetron (ZOFRAN) injection 4 mg  4 mg Intravenous Q6H PRN Gladstone Lighter, MD      . pneumococcal 23 valent vaccine (PNU-IMMUNE) injection 0.5 mL  0.5 mL Intramuscular Prior to discharge Tracie Harrier, MD      . senna (SENOKOT) tablet 8.6 mg  1 tablet Oral Daily PRN Gladstone Lighter, MD   8.6 mg at 05/23/15 1023  . sertraline (ZOLOFT) tablet 25 mg  25 mg Oral Daily Gladstone Lighter, MD   25 mg at 05/23/15 1023  . sodium chloride  0.9 % injection 10-40 mL  10-40 mL Intracatheter Q12H Vishwanath Hande, MD   30 mL at 05/23/15 2200  . sodium chloride 0.9 % injection 10-40 mL  10-40 mL Intracatheter PRN Vishwanath Hande, MD      . vitamin C (ASCORBIC ACID) tablet 500 mg  500 mg Oral Daily Gladstone Lighter, MD   500 mg at 05/23/15 1023   Allergies  Allergen Reactions  . Ciprofloxacin Shortness Of Breath, Itching and Rash  . Doxycycline Shortness Of Breath, Itching and Rash  . Penicillins Shortness Of Breath, Itching and Rash  . Sulfa Antibiotics Shortness Of Breath, Itching and Rash  . Morphine And Related Itching   Active Problems:   A-fib   Acute respiratory failure  Blood pressure 117/54, pulse 80, temperature 98.4 F (36.9 C), temperature source Oral, resp. rate 18, height '5\' 4"'$  (1.626 m), weight 74.571 kg (164 lb 6.4 oz), SpO2 97 %.  Subjective  Regina Burke is a 81 y.o. female discharged recently after a fall- re-admitted with acute on chronic respiratory failure secondary to COPD, Pneumonia and rapid AF. Breathing easier today On 3 l n/c O2 and BIPAP at night States BIPAP hurts her face  Doing better Objective: Vital signs: (most recent): Blood pressure 117/54, pulse 80, temperature 98.4 F (36.9 C), temperature source Oral, resp. rate 18, height '5\' 4"'$  (1.626 m), weight 74.571 kg (164 lb 6.4 oz), SpO2 97 %.     Constitutional:Anxious  Neck: supple, no thyromegaly, central line in  Ecchymosis noted on face and chin Respiratory: Decreased air entry bilat  Cardiovascular:Iregular Rhythm-, no murmur, no gallop Abdomen: soft, good BS, nontender Extremities: no edema Neuro: alert and oriented, no focal motor or sensory deficits Assessment & Plan 1 Acute On Chronic Hypoxemic and Hypercapnic Respiratory failure/ Diastolic CHF, Ca Lung  Possible Pneumonia  Continue IV Antibiotics, Prednisone taper and bronchodilators On 3 l N/c O2 and BIPAP at night when she can tolerate   2 Atrial fibrillation  with rapid ventricular response-likely worsened from respiratory issues. On Diltiazem and Metoprolol, careful with further dig given toxicity in the past but repsonded to one dose yesterday afternoon  Recent echo with the normal ejection fraction, diastolic dysfunction noted.  3 Hypertensive Heart disease;Continue Metoprolol  and Cardizem. And follow  4 Diabetes mellitus-On SSI  5 Hyperkalemia-recent admission with hyperkalemia as well. Her valsartan has been stopped - k stable  6 CKD - Se creat continues to improve   7 Anemia- stable  8 Weakness;Advance diet/ Physical therapy  D/c to Skilled Nursing today Signed [facility] 05/24/2015

## 2015-05-24 NOTE — Progress Notes (Signed)
Report called to Kathyrn Sheriff RN at [employer]

## 2015-06-07 ENCOUNTER — Emergency Department: Payer: Medicare Other

## 2015-06-07 ENCOUNTER — Other Ambulatory Visit: Payer: Self-pay | Admitting: Internal Medicine

## 2015-06-07 ENCOUNTER — Encounter: Payer: Self-pay | Admitting: Emergency Medicine

## 2015-06-07 ENCOUNTER — Inpatient Hospital Stay
Admission: EM | Admit: 2015-06-07 | Discharge: 2015-06-15 | DRG: 682 | Disposition: A | Discharge status: Skilled Nursing Facility | Payer: Medicare Other | Attending: Internal Medicine | Admitting: Internal Medicine

## 2015-06-07 DIAGNOSIS — I2510 Atherosclerotic heart disease of native coronary artery without angina pectoris: Secondary | ICD-10-CM | POA: Diagnosis present

## 2015-06-07 DIAGNOSIS — E249 Cushing's syndrome, unspecified: Secondary | ICD-10-CM | POA: Diagnosis present

## 2015-06-07 DIAGNOSIS — I4891 Unspecified atrial fibrillation: Secondary | ICD-10-CM

## 2015-06-07 DIAGNOSIS — Z85118 Personal history of other malignant neoplasm of bronchus and lung: Secondary | ICD-10-CM | POA: Diagnosis not present

## 2015-06-07 DIAGNOSIS — C3490 Malignant neoplasm of unspecified part of unspecified bronchus or lung: Secondary | ICD-10-CM | POA: Diagnosis present

## 2015-06-07 DIAGNOSIS — Z95828 Presence of other vascular implants and grafts: Secondary | ICD-10-CM

## 2015-06-07 DIAGNOSIS — J9601 Acute respiratory failure with hypoxia: Secondary | ICD-10-CM | POA: Diagnosis not present

## 2015-06-07 DIAGNOSIS — Z8249 Family history of ischemic heart disease and other diseases of the circulatory system: Secondary | ICD-10-CM | POA: Diagnosis not present

## 2015-06-07 DIAGNOSIS — I482 Chronic atrial fibrillation: Secondary | ICD-10-CM | POA: Diagnosis present

## 2015-06-07 DIAGNOSIS — I130 Hypertensive heart and chronic kidney disease with heart failure and stage 1 through stage 4 chronic kidney disease, or unspecified chronic kidney disease: Secondary | ICD-10-CM | POA: Diagnosis present

## 2015-06-07 DIAGNOSIS — Z955 Presence of coronary angioplasty implant and graft: Secondary | ICD-10-CM | POA: Diagnosis not present

## 2015-06-07 DIAGNOSIS — Z833 Family history of diabetes mellitus: Secondary | ICD-10-CM

## 2015-06-07 DIAGNOSIS — F419 Anxiety disorder, unspecified: Secondary | ICD-10-CM | POA: Diagnosis present

## 2015-06-07 DIAGNOSIS — Z66 Do not resuscitate: Secondary | ICD-10-CM | POA: Diagnosis present

## 2015-06-07 DIAGNOSIS — K219 Gastro-esophageal reflux disease without esophagitis: Secondary | ICD-10-CM | POA: Diagnosis present

## 2015-06-07 DIAGNOSIS — Z9049 Acquired absence of other specified parts of digestive tract: Secondary | ICD-10-CM | POA: Diagnosis not present

## 2015-06-07 DIAGNOSIS — F329 Major depressive disorder, single episode, unspecified: Secondary | ICD-10-CM | POA: Diagnosis present

## 2015-06-07 DIAGNOSIS — I480 Paroxysmal atrial fibrillation: Secondary | ICD-10-CM | POA: Diagnosis present

## 2015-06-07 DIAGNOSIS — J9622 Acute and chronic respiratory failure with hypercapnia: Secondary | ICD-10-CM | POA: Diagnosis present

## 2015-06-07 DIAGNOSIS — Z882 Allergy status to sulfonamides status: Secondary | ICD-10-CM | POA: Diagnosis not present

## 2015-06-07 DIAGNOSIS — J9811 Atelectasis: Secondary | ICD-10-CM | POA: Diagnosis present

## 2015-06-07 DIAGNOSIS — M109 Gout, unspecified: Secondary | ICD-10-CM | POA: Diagnosis present

## 2015-06-07 DIAGNOSIS — T460X5A Adverse effect of cardiac-stimulant glycosides and drugs of similar action, initial encounter: Secondary | ICD-10-CM | POA: Diagnosis present

## 2015-06-07 DIAGNOSIS — E1122 Type 2 diabetes mellitus with diabetic chronic kidney disease: Secondary | ICD-10-CM | POA: Diagnosis present

## 2015-06-07 DIAGNOSIS — E7800 Pure hypercholesterolemia, unspecified: Secondary | ICD-10-CM | POA: Diagnosis present

## 2015-06-07 DIAGNOSIS — Z885 Allergy status to narcotic agent status: Secondary | ICD-10-CM | POA: Diagnosis not present

## 2015-06-07 DIAGNOSIS — E875 Hyperkalemia: Secondary | ICD-10-CM

## 2015-06-07 DIAGNOSIS — J9602 Acute respiratory failure with hypercapnia: Secondary | ICD-10-CM | POA: Diagnosis not present

## 2015-06-07 DIAGNOSIS — R918 Other nonspecific abnormal finding of lung field: Secondary | ICD-10-CM | POA: Diagnosis not present

## 2015-06-07 DIAGNOSIS — Z79899 Other long term (current) drug therapy: Secondary | ICD-10-CM | POA: Diagnosis not present

## 2015-06-07 DIAGNOSIS — Z90710 Acquired absence of both cervix and uterus: Secondary | ICD-10-CM

## 2015-06-07 DIAGNOSIS — C3411 Malignant neoplasm of upper lobe, right bronchus or lung: Secondary | ICD-10-CM | POA: Diagnosis not present

## 2015-06-07 DIAGNOSIS — N183 Chronic kidney disease, stage 3 (moderate): Secondary | ICD-10-CM | POA: Diagnosis present

## 2015-06-07 DIAGNOSIS — D649 Anemia, unspecified: Secondary | ICD-10-CM | POA: Diagnosis present

## 2015-06-07 DIAGNOSIS — Z87891 Personal history of nicotine dependence: Secondary | ICD-10-CM | POA: Diagnosis not present

## 2015-06-07 DIAGNOSIS — N179 Acute kidney failure, unspecified: Secondary | ICD-10-CM | POA: Diagnosis present

## 2015-06-07 DIAGNOSIS — Z880 Allergy status to penicillin: Secondary | ICD-10-CM

## 2015-06-07 DIAGNOSIS — M1990 Unspecified osteoarthritis, unspecified site: Secondary | ICD-10-CM | POA: Diagnosis present

## 2015-06-07 DIAGNOSIS — J9690 Respiratory failure, unspecified, unspecified whether with hypoxia or hypercapnia: Secondary | ICD-10-CM

## 2015-06-07 DIAGNOSIS — I5033 Acute on chronic diastolic (congestive) heart failure: Secondary | ICD-10-CM | POA: Diagnosis present

## 2015-06-07 DIAGNOSIS — J9621 Acute and chronic respiratory failure with hypoxia: Secondary | ICD-10-CM | POA: Diagnosis present

## 2015-06-07 DIAGNOSIS — G4733 Obstructive sleep apnea (adult) (pediatric): Secondary | ICD-10-CM | POA: Diagnosis present

## 2015-06-07 DIAGNOSIS — E785 Hyperlipidemia, unspecified: Secondary | ICD-10-CM | POA: Diagnosis present

## 2015-06-07 DIAGNOSIS — J449 Chronic obstructive pulmonary disease, unspecified: Secondary | ICD-10-CM | POA: Diagnosis present

## 2015-06-07 DIAGNOSIS — L89329 Pressure ulcer of left buttock, unspecified stage: Secondary | ICD-10-CM | POA: Diagnosis present

## 2015-06-07 DIAGNOSIS — N19 Unspecified kidney failure: Secondary | ICD-10-CM

## 2015-06-07 DIAGNOSIS — R9389 Abnormal findings on diagnostic imaging of other specified body structures: Secondary | ICD-10-CM

## 2015-06-07 DIAGNOSIS — L8990 Pressure ulcer of unspecified site, unspecified stage: Secondary | ICD-10-CM | POA: Insufficient documentation

## 2015-06-07 DIAGNOSIS — Z7982 Long term (current) use of aspirin: Secondary | ICD-10-CM | POA: Diagnosis not present

## 2015-06-07 DIAGNOSIS — Z923 Personal history of irradiation: Secondary | ICD-10-CM

## 2015-06-07 DIAGNOSIS — J9620 Acute and chronic respiratory failure, unspecified whether with hypoxia or hypercapnia: Secondary | ICD-10-CM | POA: Diagnosis not present

## 2015-06-07 DIAGNOSIS — Z9851 Tubal ligation status: Secondary | ICD-10-CM

## 2015-06-07 DIAGNOSIS — J90 Pleural effusion, not elsewhere classified: Secondary | ICD-10-CM | POA: Diagnosis present

## 2015-06-07 DIAGNOSIS — Z9889 Other specified postprocedural states: Secondary | ICD-10-CM

## 2015-06-07 DIAGNOSIS — Z825 Family history of asthma and other chronic lower respiratory diseases: Secondary | ICD-10-CM | POA: Diagnosis not present

## 2015-06-07 DIAGNOSIS — I252 Old myocardial infarction: Secondary | ICD-10-CM

## 2015-06-07 DIAGNOSIS — J80 Acute respiratory distress syndrome: Secondary | ICD-10-CM | POA: Diagnosis not present

## 2015-06-07 LAB — HEPATIC FUNCTION PANEL
ALK PHOS: 60 U/L (ref 38–126)
ALT: 14 U/L (ref 14–54)
AST: 15 U/L (ref 15–41)
Albumin: 3.5 g/dL (ref 3.5–5.0)
Bilirubin, Direct: 0.2 mg/dL (ref 0.1–0.5)
Indirect Bilirubin: 0.5 mg/dL (ref 0.3–0.9)
TOTAL PROTEIN: 6.9 g/dL (ref 6.5–8.1)
Total Bilirubin: 0.7 mg/dL (ref 0.3–1.2)

## 2015-06-07 LAB — TROPONIN I: Troponin I: 0.03 ng/mL (ref <0.031)

## 2015-06-07 LAB — BASIC METABOLIC PANEL
Anion gap: 10 (ref 5–15)
BUN: 80 mg/dL — ABNORMAL HIGH (ref 6–20)
CO2: 28 mmol/L (ref 22–32)
Calcium: 9.1 mg/dL (ref 8.9–10.3)
Chloride: 100 mmol/L — ABNORMAL LOW (ref 101–111)
Creatinine, Ser: 2.32 mg/dL — ABNORMAL HIGH (ref 0.44–1.00)
GFR calc non Af Amer: 19 mL/min — ABNORMAL LOW (ref >60)
GFR, EST AFRICAN AMERICAN: 22 mL/min — AB (ref >60)
Glucose, Bld: 159 mg/dL — ABNORMAL HIGH (ref 65–99)
POTASSIUM: 6.2 mmol/L — AB (ref 3.5–5.1)
SODIUM: 138 mmol/L (ref 135–145)

## 2015-06-07 LAB — CBC WITH DIFFERENTIAL/PLATELET
BASOS ABS: 0 10*3/uL (ref 0–0.1)
Basophils Relative: 1 %
Eosinophils Absolute: 0.1 10*3/uL (ref 0–0.7)
Eosinophils Relative: 2 %
HEMATOCRIT: 25.2 % — AB (ref 35.0–47.0)
Hemoglobin: 8.3 g/dL — ABNORMAL LOW (ref 12.0–16.0)
LYMPHS PCT: 8 %
Lymphs Abs: 0.5 10*3/uL — ABNORMAL LOW (ref 1.0–3.6)
MCH: 26.3 pg (ref 26.0–34.0)
MCHC: 32.7 g/dL (ref 32.0–36.0)
MCV: 80.3 fL (ref 80.0–100.0)
MONO ABS: 0.3 10*3/uL (ref 0.2–0.9)
MONOS PCT: 5 %
NEUTROS ABS: 5.5 10*3/uL (ref 1.4–6.5)
Neutrophils Relative %: 84 %
Platelets: 217 10*3/uL (ref 150–440)
RBC: 3.14 MIL/uL — ABNORMAL LOW (ref 3.80–5.20)
RDW: 16.8 % — AB (ref 11.5–14.5)
WBC: 6.4 10*3/uL (ref 3.6–11.0)

## 2015-06-07 LAB — LACTIC ACID, PLASMA
Lactic Acid, Venous: 1.1 mmol/L (ref 0.5–2.0)
Lactic Acid, Venous: 1.8 mmol/L (ref 0.5–2.0)

## 2015-06-07 LAB — BRAIN NATRIURETIC PEPTIDE: B NATRIURETIC PEPTIDE 5: 941 pg/mL — AB (ref 0.0–100.0)

## 2015-06-07 LAB — DIGOXIN LEVEL: Digoxin Level: 2.1 ng/mL — ABNORMAL HIGH (ref 0.8–2.0)

## 2015-06-07 MED ORDER — DILTIAZEM HCL 25 MG/5ML IV SOLN
5.0000 mg | Freq: Once | INTRAVENOUS | Status: AC
  Administered 2015-06-07: 5 mg via INTRAVENOUS
  Filled 2015-06-07: qty 5

## 2015-06-07 MED ORDER — SODIUM CHLORIDE 0.9 % IJ SOLN
3.0000 mL | Freq: Two times a day (BID) | INTRAMUSCULAR | Status: DC
  Administered 2015-06-07 – 2015-06-15 (×13): 3 mL via INTRAVENOUS

## 2015-06-07 MED ORDER — ONDANSETRON HCL 4 MG/2ML IJ SOLN: 4.0000 mg | Freq: Four times a day (QID) | INTRAMUSCULAR | Status: DC | PRN

## 2015-06-07 MED ORDER — ACETAMINOPHEN 325 MG PO TABS: 650.0000 mg | ORAL_TABLET | Freq: Four times a day (QID) | ORAL | Status: DC | PRN

## 2015-06-07 MED ORDER — SODIUM POLYSTYRENE SULFONATE 15 GM/60ML PO SUSP
30.0000 g | Freq: Once | ORAL | Status: AC
  Administered 2015-06-07: 30 g via ORAL
  Filled 2015-06-07: qty 120

## 2015-06-07 MED ORDER — HEPARIN SODIUM (PORCINE) 5000 UNIT/ML IJ SOLN
5000.0000 [IU] | Freq: Three times a day (TID) | INTRAMUSCULAR | Status: DC
  Administered 2015-06-07 – 2015-06-15 (×21): 5000 [IU] via SUBCUTANEOUS
  Filled 2015-06-07 (×20): qty 1

## 2015-06-07 MED ORDER — SODIUM CHLORIDE 0.9 % IV SOLN
Freq: Once | INTRAVENOUS | Status: AC
  Administered 2015-06-07: 20:00:00 via INTRAVENOUS

## 2015-06-07 MED ORDER — SODIUM CHLORIDE 0.9 % IV SOLN
INTRAVENOUS | Status: DC
  Administered 2015-06-09: 10:00:00 via INTRAVENOUS

## 2015-06-07 MED ORDER — DILTIAZEM HCL 100 MG IV SOLR
5.0000 mg/h | Freq: Once | INTRAVENOUS | Status: AC
  Administered 2015-06-07: 5 mg/h via INTRAVENOUS
  Filled 2015-06-07: qty 100

## 2015-06-07 MED ORDER — SODIUM CHLORIDE 0.9 % IV SOLN
1.0000 g | Freq: Once | INTRAVENOUS | Status: AC
  Administered 2015-06-07: 1 g via INTRAVENOUS
  Filled 2015-06-07: qty 10

## 2015-06-07 MED ORDER — ACETAMINOPHEN 650 MG RE SUPP: 650.0000 mg | Freq: Four times a day (QID) | RECTAL | Status: DC | PRN

## 2015-06-07 MED ORDER — ALBUTEROL SULFATE (2.5 MG/3ML) 0.083% IN NEBU
2.5000 mg | INHALATION_SOLUTION | Freq: Once | RESPIRATORY_TRACT | Status: AC
  Administered 2015-06-07: 2.5 mg via RESPIRATORY_TRACT
  Filled 2015-06-07: qty 3

## 2015-06-07 MED ORDER — ONDANSETRON HCL 4 MG PO TABS: 4.0000 mg | ORAL_TABLET | Freq: Four times a day (QID) | ORAL | Status: DC | PRN

## 2015-06-07 MED ORDER — SODIUM BICARBONATE 8.4 % IV SOLN
50.0000 meq | Freq: Once | INTRAVENOUS | Status: AC
  Administered 2015-06-07: 50 meq via INTRAVENOUS
  Filled 2015-06-07: qty 50

## 2015-06-07 MED ORDER — DILTIAZEM HCL 25 MG/5ML IV SOLN
10.0000 mg | Freq: Once | INTRAVENOUS | Status: AC
  Administered 2015-06-07: 10 mg via INTRAVENOUS

## 2015-06-07 NOTE — ED Notes (Signed)
Awaiting call from CCU for hand-off report.

## 2015-06-07 NOTE — ED Notes (Signed)
Pt presents to ED via EMS from [facility] with c/o of anxiety and elevated K+ value of 7.1. EMS states pt has been experiencing presenting sx all day, with no relief from use of daily medications. EMS states pt has been tearful all day, not unusual for pt. EMS reports pt hx of atrial fibrillation with heart rate ranging 80-150. Pt arrived to ER alert and oriented x4.

## 2015-06-07 NOTE — ED Provider Notes (Signed)
[facility] Emergency Department Provider Note  ____________________________________________  Time seen: Approximately 8:18 PM  I have reviewed the triage vital signs and the nursing notes.   HISTORY  Chief Complaint Anxiety    HPI Regina Burke is a 81 y.o. female patient comes in for potassium of 7.1 which was drawn today. Patient's also anxious and crying patient says she feels somewhat short of breath as well. Nothing seems to hurt her including chest pain belly pain etc.  Past Medical History  Diagnosis Date  . COPD (chronic obstructive pulmonary disease) ([place])   . CHF (congestive heart failure) ([place])   . Hypertension   . Rapid palpitations 2014    Seen at [facility], may have been atrial fibrillation  . DOE (dyspnea on exertion)     Started after treatment for lung cancer  . High cholesterol   . Heart murmur   . Myocardial infarction ([place]) 12/22/2014   . Lung cancer ([place]) dx'd 2014    S/P radiation 2015  . Sleep apnea   . Cushing's disease ([place])   . Type II diabetes mellitus ([place])   . Anemia   . GERD (gastroesophageal reflux disease)   . History of hiatal hernia   . Migraine   . Arthritis   . Gout   . Depression   . Chronic kidney disease (CKD), stage III (moderate)     Patient Active Problem List   Diagnosis Date Noted  . Acute respiratory failure ([place]) 05/16/2015  . Dehydration 05/09/2015  . Diabetes ([place]) 05/09/2015  . Closed fracture nasal bone 05/09/2015  . Preoperative clearance 04/15/2015  . Hyperkalemia 04/14/2015  . CAD (coronary artery disease) 04/14/2015  . Cancer of upper lobe of right lung ([place]) 01/08/2015  . Allergic state 01/08/2015  . Absolute anemia 01/08/2015  . A-fib ([place]) 01/08/2015  . Chronic kidney disease 01/08/2015  . CAFL (chronic airflow limitation) ([place]) 01/08/2015  . Arthritis, degenerative 01/08/2015  . 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 Unstable angina ([place]) 12/23/2014  . Hypertension   .  Rapid palpitations   . Diabetes mellitus, type 2 ([place]) 09/11/2014    Past Surgical History  Procedure Laterality Date  . Adrenalectomy Left 1980's    "[place]"  . Abdominal hysterectomy    . Appendectomy    . Cholecystectomy    . Coronary angioplasty with stent placement  12/23/2014  . Tonsillectomy    . Fracture surgery    . Wrist fracture surgery Bilateral ~ 2000  . Breast cyst excision Left   . Tubal ligation    . Left heart catheterization with coronary angiogram N/A 12/23/2014    Procedure: LEFT HEART CATHETERIZATION WITH CORONARY ANGIOGRAM;  Surgeon: Burnell Blanks, MD;  Location[facility] CATH LAB;  Service: Cardiovascular;  Laterality: N/A;  . Percutaneous coronary stent intervention (pci-s)  12/23/2014    Procedure: PERCUTANEOUS CORONARY STENT INTERVENTION (PCI-S);  Surgeon: Burnell Blanks, MD;  Location: [facility] CATH LAB;  Service: Cardiovascular;;  Promus 2.25x8    Current Outpatient Rx  Name  Route  Sig  Dispense  Refill  . ALPRAZolam (XANAX) 0.25 MG tablet   Oral   Take 1 tablet (0.25 mg total) by mouth 3 (three) times daily as needed for anxiety.   30 tablet   0   . aspirin EC 81 MG tablet   Oral   Take 1 tablet (81 mg total) by mouth daily.   90 tablet   3   . atorvastatin (LIPITOR) 10 MG tablet  Oral   Take 10 mg by mouth daily.         . cholecalciferol (VITAMIN D) 400 UNITS TABS tablet   Oral   Take 400 Units by mouth daily.         . clopidogrel (PLAVIX) 75 MG tablet   Oral   Take 1 tablet (75 mg total) by mouth daily.   30 tablet   11   . Cyanocobalamin (VITAMIN B-12) 500 MCG SUBL   Sublingual   Place 500 mcg under the tongue daily.         . digoxin (DIGITEK) 0.125 MG tablet   Oral   Take 0.125 mg by mouth daily.         Marland Kitchen diltiazem (DILACOR XR) 180 MG 24 hr capsule   Oral   Take 180 mg by mouth daily.         Marland Kitchen docusate sodium (COLACE) 100 MG capsule   Oral   Take 1 capsule (100 mg total) by mouth 2 (two) times daily  as needed for mild constipation.   10 capsule   0   . famotidine (PEPCID) 20 MG tablet   Oral   Take 1 tablet (20 mg total) by mouth daily.   30 tablet   3   . febuxostat (ULORIC) 40 MG tablet   Oral   Take 40 mg by mouth daily.          . fenofibrate (TRICOR) 48 MG tablet   Oral   Take 48 mg by mouth daily.         . ferrous sulfate 325 (65 FE) MG EC tablet   Oral   Take 325 mg by mouth daily.          Marland Kitchen ipratropium-albuterol (DUONEB) 0.5-2.5 (3) MG/3ML SOLN   Nebulization   Take 3 mLs by nebulization every 4 (four) hours as needed.   360 mL   3   . metFORMIN (GLUCOPHAGE) 500 MG tablet   Oral   Take 500 mg by mouth 2 (two) times daily with a meal.         . metoprolol succinate (TOPROL-XL) 100 MG 24 hr tablet   Oral   Take 1 tablet (100 mg total) by mouth daily. Take with or immediately following a meal.   30 tablet   11   . Multiple Vitamins-Minerals (PRESERVISION AREDS 2) CAPS   Oral   Take 1 capsule by mouth daily.         . nitroGLYCERIN (NITROSTAT) 0.4 MG SL tablet   Sublingual   Place 1 tablet (0.4 mg total) under the tongue every 5 (five) minutes as needed for chest pain.   25 tablet   3   . senna (SENOKOT) 8.6 MG TABS tablet   Oral   Take 1 tablet (8.6 mg total) by mouth daily as needed for mild constipation.   120 each   0   . sertraline (ZOLOFT) 25 MG tablet   Oral   Take 25 mg by mouth daily.         . vitamin C (ASCORBIC ACID) 500 MG tablet   Oral   Take 500 mg by mouth daily.           Allergies Ciprofloxacin; Doxycycline; Penicillins; Sulfa antibiotics; and Morphine and related  Family History  Problem Relation Age of Onset  . Heart disease Mother   . Diabetes Mother   . Osteoarthritis Mother   . Hypertension Mother   . Heart  disease Father   . Hypertension Father   . COPD Brother     Social History Social History  Substance Use Topics  . Smoking status: Former Smoker -- 1.00 packs/day for 45 years     Types: Cigarettes    Quit date: 04/25/1998  . Smokeless tobacco: Never Used  . Alcohol Use: Yes     Comment: 12/23/2014 "might have a couple mixed drinks/year"    Review of Systems Constitutional: No fever/chills Eyes: No visual changes. ENT: No sore throat. Cardiovascular: Denies chest pain. Respiratory: Denies shortness of breath. Gastrointestinal: No abdominal pain.  No nausea, no vomiting.  No diarrhea.  No constipation. Genitourinary: Negative for dysuria. Musculoskeletal: Negative for back pain. Skin: Negative for rash.  10-point ROS otherwise negative.  ____________________________________________   PHYSICAL EXAM:  VITAL SIGNS: ED Triage Vitals  Enc Vitals Group     BP 06/07/15 1914 107/71 mmHg     Pulse Rate 06/07/15 1914 138     Resp 06/07/15 1914 21     Temp 06/07/15 1914 98.1 F (36.7 C)     Temp Source 06/07/15 1914 Oral     SpO2 06/07/15 1914 95 %     Weight 06/07/15 1914 162 lb (73.483 kg)     Height 06/07/15 1914 '5\' 5"'$  (1.651 m)     Head Cir --      [place] --      Pain Score 06/07/15 1917 0     Pain Loc --      Pain Edu? --      Excl. in [place]? --     Constitutional: Alert and oriented. Well appearing and in no acute distress. She was however crying initially she called him later on Eyes: Conjunctivae are normal. PERRL. EOMI. Head: Atraumatic. Nose: No congestion/rhinnorhea. Mouth/Throat: Mucous membranes are moist.  Oropharynx non-erythematous. Neck: No stridor.   Cardiovascular: Tachycardia irregularly irregular rhythm. Grossly normal heart sounds.  Good peripheral circulation. Respiratory: Normal respiratory effort.  No retractions. Lungs CTAB. Gastrointestinal: Soft and nontender. No distention. No abdominal bruits. No CVA tenderness. Musculoskeletal: No lower extremity tenderness trace edema.  No joint effusions. Neurologic:  Normal speech and language. No gross focal neurologic deficits are appreciated. No gait instability. Skin:  Skin is  warm, dry and intact. No rash noted. Psychiatric: Mood and affect are normal. Speech and behavior are normal.  ____________________________________________   LABS (all labs ordered are listed, but only abnormal results are displayed)  Labs Reviewed  BASIC METABOLIC PANEL - Abnormal; Notable for the following:    Potassium 6.2 (*)    Chloride 100 (*)    Glucose, Bld 159 (*)    BUN 80 (*)    Creatinine, Ser 2.32 (*)    GFR calc non Af Amer 19 (*)    GFR calc Af Amer 22 (*)    All other components within normal limits  CBC WITH DIFFERENTIAL/PLATELET - Abnormal; Notable for the following:    RBC 3.14 (*)    Hemoglobin 8.3 (*)    HCT 25.2 (*)    RDW 16.8 (*)    Lymphs Abs 0.5 (*)    All other components within normal limits  BRAIN NATRIURETIC PEPTIDE - Abnormal; Notable for the following:    B Natriuretic Peptide 941.0 (*)    All other components within normal limits  HEPATIC FUNCTION PANEL  TROPONIN I  LACTIC ACID, PLASMA  LACTIC ACID, PLASMA  DIGOXIN LEVEL   ____________________________________________  EKG  Atrial fibrillation with rapid ventricular  response probably rate related changes ____________________________________________  RADIOLOGY  Chest x-ray shows by basilar haziness possibly CHF ____________________________________________   PROCEDURES    ____________________________________________   INITIAL IMPRESSION / ASSESSMENT AND PLAN / ED COURSE  Pertinent labs & imaging results that were available during my care of the patient were reviewed by me and considered in my medical decision making (see chart for details).  Patient's potassium has fallen somewhat from this morning. Her renal function however is poor compared to what it was 2 months ago. Patient's rapid ventricular response to atrial fibrillation continues with really no response to the diltiazem 5 or 10 boluses we will put her on a drip and plan to admit  her. ____________________________________________   FINAL CLINICAL IMPRESSION(S) / ED DIAGNOSES  Final diagnoses:  Hyperkalemia  Renal failure  Atrial fibrillation with rapid ventricular response ([place])      Nena Polio, MD 06/07/15 2124

## 2015-06-07 NOTE — H&P (Signed)
[facility] NAME: Regina Burke    MR#:  [id number]  DATE OF BIRTH:  03-01-1933   DATE OF ADMISSION:  06/07/2015  PRIMARY CARE PHYSICIAN: Tracie Harrier, MD   REQUESTING/REFERRING PHYSICIAN: Malinda  CHIEF COMPLAINT:   Chief Complaint  Patient presents with  . Anxiety   hyperkalemia  HISTORY OF PRESENT ILLNESS:  Regina Burke  is a 81 y.o. female with a known history of chronic atrial fibrillation, GERD without esophagitis who is presenting with abnormal lab values. Patient was sent to the hospital for elevated potassium levels supposedly of 7.1 she actually denies any further symptomatology at this time. Of note upon arrival to the emergency department she was noted to be in atrial fibrillation with rapid ventricular response heart rate up to the 140s she essentially bolus of Cardizem and started on Cardizem drip and her heart rate is somewhat better controlled but still remains elevated. She was also treated appropriately for her hyperkalemia by emergency room staff  PAST MEDICAL HISTORY:   Past Medical History  Diagnosis Date  . COPD (chronic obstructive pulmonary disease) ([place])   . CHF (congestive heart failure) ([place])   . Hypertension   . Rapid palpitations 2014    Seen at [facility], may have been atrial fibrillation  . DOE (dyspnea on exertion)     Started after treatment for lung cancer  . High cholesterol   . Heart murmur   . Myocardial infarction ([place]) 12/22/2014   . Lung cancer ([place]) dx'd 2014    S/P radiation 2015  . Sleep apnea   . Cushing's disease ([place])   . Type II diabetes mellitus ([place])   . Anemia   . GERD (gastroesophageal reflux disease)   . History of hiatal hernia   . Migraine   . Arthritis   . Gout   . Depression   . Chronic kidney disease (CKD), stage III (moderate)     PAST SURGICAL HISTORY:   Past Surgical History  Procedure Laterality Date  . Adrenalectomy Left 1980's     "Cushings"  . Abdominal hysterectomy    . Appendectomy    . Cholecystectomy    . Coronary angioplasty with stent placement  12/23/2014  . Tonsillectomy    . Fracture surgery    . Wrist fracture surgery Bilateral ~ 2000  . Breast cyst excision Left   . Tubal ligation    . Left heart catheterization with coronary angiogram N/A 12/23/2014    Procedure: LEFT HEART CATHETERIZATION WITH CORONARY ANGIOGRAM;  Surgeon: Burnell Blanks, MD;  Location: [facility] CATH LAB;  Service: Cardiovascular;  Laterality: N/A;  . Percutaneous coronary stent intervention (pci-s)  12/23/2014    Procedure: PERCUTANEOUS CORONARY STENT INTERVENTION (PCI-S);  Surgeon: Burnell Blanks, MD;  Location: [facility] CATH LAB;  Service: Cardiovascular;;  Promus 2.25x8    SOCIAL HISTORY:   Social History  Substance Use Topics  . Smoking status: Former Smoker -- 1.00 packs/day for 45 years    Types: Cigarettes    Quit date: 04/25/1998  . Smokeless tobacco: Never Used  . Alcohol Use: Yes     Comment: 12/23/2014 "might have a couple mixed drinks/year"    FAMILY HISTORY:   Family History  Problem Relation Age of Onset  . Heart disease Mother   . Diabetes Mother   . Osteoarthritis Mother   . Hypertension Mother   . Heart disease Father   . Hypertension Father   . COPD Brother  DRUG ALLERGIES:   Allergies  Allergen Reactions  . Ciprofloxacin Shortness Of Breath, Itching and Rash  . Doxycycline Shortness Of Breath, Itching and Rash  . Penicillins Shortness Of Breath, Itching and Rash  . Sulfa Antibiotics Shortness Of Breath, Itching and Rash  . Morphine And Related Itching    REVIEW OF SYSTEMS:  REVIEW OF SYSTEMS:  CONSTITUTIONAL: Denies fevers, chills, fatigue, weakness.  EYES: Denies blurred vision, double vision, or eye pain.  EARS, NOSE, THROAT: Denies tinnitus, ear pain, hearing loss.  RESPIRATORY: denies cough, shortness of breath, wheezing  CARDIOVASCULAR: Denies chest pain, palpitations, edema.   GASTROINTESTINAL: Denies nausea, vomiting, diarrhea, abdominal pain.  GENITOURINARY: Denies dysuria, hematuria.  ENDOCRINE: Denies nocturia or thyroid problems. HEMATOLOGIC AND LYMPHATIC: Denies easy bruising or bleeding.  SKIN: Denies rash or lesions.  MUSCULOSKELETAL: Denies pain in neck, back, shoulder, knees, hips, or further arthritic symptoms.  NEUROLOGIC: Denies paralysis, paresthesias.  PSYCHIATRIC: Denies anxiety or depressive symptoms. Otherwise full review of systems performed by me is negative.   MEDICATIONS AT HOME:   Prior to Admission medications   Medication Sig Start Date End Date Taking? Authorizing Provider  ALPRAZolam (XANAX) 0.25 MG tablet Take 1 tablet (0.25 mg total) by mouth 3 (three) times daily as needed for anxiety. 05/24/15  Yes Vishwanath Hande, MD  aspirin EC 81 MG tablet Take 1 tablet (81 mg total) by mouth daily. 04/13/15  Yes Troy Sine, MD  atorvastatin (LIPITOR) 10 MG tablet Take 10 mg by mouth daily.   Yes Historical Provider, MD  cholecalciferol (VITAMIN D) 400 UNITS TABS tablet Take 400 Units by mouth daily.   Yes Historical Provider, MD  clopidogrel (PLAVIX) 75 MG tablet Take 1 tablet (75 mg total) by mouth daily. 12/24/14  Yes Rhonda G Barrett, PA-C  Cyanocobalamin (VITAMIN B-12) 500 MCG SUBL Place 500 mcg under the tongue daily.   Yes Historical Provider, MD  digoxin (DIGITEK) 0.125 MG tablet Take 0.125 mg by mouth daily.   Yes Historical Provider, MD  diltiazem (DILACOR XR) 180 MG 24 hr capsule Take 180 mg by mouth daily.   Yes Historical Provider, MD  docusate sodium (COLACE) 100 MG capsule Take 1 capsule (100 mg total) by mouth 2 (two) times daily as needed for mild constipation. 05/24/15  Yes Vishwanath Hande, MD  famotidine (PEPCID) 20 MG tablet Take 1 tablet (20 mg total) by mouth daily. 05/24/15  Yes Vishwanath Hande, MD  febuxostat (ULORIC) 40 MG tablet Take 40 mg by mouth daily.    Yes Historical Provider, MD  fenofibrate (TRICOR) 48 MG  tablet Take 48 mg by mouth daily.   Yes Historical Provider, MD  ferrous sulfate 325 (65 FE) MG EC tablet Take 325 mg by mouth daily.    Yes Historical Provider, MD  ipratropium-albuterol (DUONEB) 0.5-2.5 (3) MG/3ML SOLN Take 3 mLs by nebulization every 4 (four) hours as needed. 05/24/15  Yes Vishwanath Hande, MD  metFORMIN (GLUCOPHAGE) 500 MG tablet Take 500 mg by mouth 2 (two) times daily with a meal.   Yes Historical Provider, MD  metoprolol succinate (TOPROL-XL) 100 MG 24 hr tablet Take 1 tablet (100 mg total) by mouth daily. Take with or immediately following a meal. 12/24/14  Yes Rhonda G Barrett, PA-C  Multiple Vitamins-Minerals (PRESERVISION AREDS 2) CAPS Take 1 capsule by mouth daily.   Yes Historical Provider, MD  nitroGLYCERIN (NITROSTAT) 0.4 MG SL tablet Place 1 tablet (0.4 mg total) under the tongue every 5 (five) minutes as needed for chest pain.  12/24/14  Yes Rhonda G Barrett, PA-C  senna (SENOKOT) 8.6 MG TABS tablet Take 1 tablet (8.6 mg total) by mouth daily as needed for mild constipation. 05/24/15  Yes Vishwanath Hande, MD  sertraline (ZOLOFT) 25 MG tablet Take 25 mg by mouth daily.   Yes Historical Provider, MD  traMADol (ULTRAM) 50 MG tablet Take 1 tablet by mouth 2 (two) times daily. 06/04/15  Yes Historical Provider, MD  vitamin C (ASCORBIC ACID) 500 MG tablet Take 500 mg by mouth daily.   Yes Historical Provider, MD      VITAL SIGNS:  Blood pressure 119/92, pulse 123, temperature 98.1 F (36.7 C), temperature source Oral, resp. rate 25, height '5\' 5"'$  (1.651 m), weight 162 lb (73.483 kg), SpO2 94 %.  PHYSICAL EXAMINATION:  VITAL SIGNS: Filed Vitals:   06/07/15 2119  BP: 119/92  Pulse: 123  Temp:   Resp: 25   GENERAL:81 y.o.female currently in no acute distress.  HEAD: Normocephalic, atraumatic.  EYES: Pupils equal, round, reactive to light. Extraocular muscles intact. No scleral icterus.  MOUTH: Moist mucosal membrane. Dentition intact. No abscess noted.  EAR, NOSE,  THROAT: Clear without exudates. No external lesions.  NECK: Supple. No thyromegaly. No nodules. No JVD.  PULMONARY: Clear to ascultation, without wheeze rails or rhonci. No use of accessory muscles, Good respiratory effort. good air entry bilaterally CHEST: Nontender to palpation.  CARDIOVASCULAR: S1 and S2. Irregular rate and rhythm. No murmurs, rubs, or gallops. No edema. Pedal pulses 2+ bilaterally.  GASTROINTESTINAL: Soft, nontender, nondistended. No masses. Positive bowel sounds. No hepatosplenomegaly.  MUSCULOSKELETAL: No swelling, clubbing, or edema. Range of motion full in all extremities.  NEUROLOGIC: Cranial nerves II through XII are intact. No gross focal neurological deficits. Sensation intact. Reflexes intact.  SKIN: No ulceration, lesions, rashes, or cyanosis. Skin warm and dry. Turgor intact.  PSYCHIATRIC: Mood, affect within normal limits. The patient is awake, alert and oriented x 3. Insight, judgment intact.    LABORATORY PANEL:   CBC  Recent Labs Lab 06/07/15 1939  WBC 6.4  HGB 8.3*  HCT 25.2*  PLT 217   ------------------------------------------------------------------------------------------------------------------  Chemistries   Recent Labs Lab 06/07/15 1939  NA 138  K 6.2*  CL 100*  CO2 28  GLUCOSE 159*  BUN 80*  CREATININE 2.32*  CALCIUM 9.1  AST 15  ALT 14  ALKPHOS 60  BILITOT 0.7   ------------------------------------------------------------------------------------------------------------------  Cardiac Enzymes  Recent Labs Lab 06/07/15 1939  TROPONINI 0.03   ------------------------------------------------------------------------------------------------------------------  RADIOLOGY:  Dg Chest Portable 1 View  06/07/2015   CLINICAL DATA:  Wheezing and cough, shortness of breath. History of COPD, CHF, hypertension, lung cancer in 2014.  EXAM: PORTABLE CHEST 1 VIEW  COMPARISON:  Chest x-ray dated 05/20/2015.  FINDINGS:  Cardiomediastinal silhouette appears stable in size and configuration. Cardiomegaly is grossly unchanged. Again noted is central pulmonary vascular congestion and bibasilar airspace opacities. Overall appearance of the lungs is unchanged. Left-sided central line has been removed in the interval. Surgical changes again seen over the right upper lung.  IMPRESSION: Overall stable appearance of the chest x-ray. Again noted is cardiomegaly with central pulmonary vascular congestion suggesting volume overload/CHF (persistent versus recurrent).  Bibasilar opacities are also unchanged and probably represent a combination of atelectasis and bibasilar pulmonary edema with adjacent pleural effusions. As also stated on the previous report, bibasilar pneumonia cannot be confidently excluded.   Electronically Signed   By: Franki Cabot M.D.   On: 06/07/2015 19:58    EKG:  Orders placed or performed during the hospital encounter of 06/07/15  . EKG 12-Lead  . EKG 12-Lead  . ED EKG  . ED EKG    IMPRESSION AND PLAN:   81 year old Caucasian female history of atrial fibrillation who is presenting with hyperkalemia from routine lab work  1. Acute kidney injury: She does attests to some poor by mouth intake, IV fluid hydration, fluid challenge, follow urine output/renal function, check renal ultrasound, hold nephrotoxic agents 2. Hyperkalemia: Has received appropriate treatment and emergency department will continue to follow potassium every 6 hours to reassess the need for further treatment, continue IV fluid hydration 3. Atrial fibrillation rapid ventricular response: Continue Cardizem drip goal heart rate less than 120 4. Type 2 diabetes non-insulin-requiring: Hold oral agents at insulin sliding scale 5. Venous thromboembolism prophylactic: Heparin subcutaneous    All the records are reviewed and case discussed with ED provider. Management plans discussed with the patient, family and they are in  agreement.  CODE STATUS: Full  TOTAL TIME TAKING CARE OF THIS PATIENT: 50 critical minutes.    Hower,  Karenann Cai.D on 06/07/2015 at 11:06 PM  Between 7am to 6pm - Pager - [phone]  After 6pm: House Pager: - [phone]  [facility]  Office  [phone]  CC: Primary care physician; Tracie Harrier, MD

## 2015-06-08 ENCOUNTER — Inpatient Hospital Stay: Payer: Medicare Other

## 2015-06-08 DIAGNOSIS — J9601 Acute respiratory failure with hypoxia: Secondary | ICD-10-CM

## 2015-06-08 DIAGNOSIS — R918 Other nonspecific abnormal finding of lung field: Secondary | ICD-10-CM

## 2015-06-08 DIAGNOSIS — L8990 Pressure ulcer of unspecified site, unspecified stage: Secondary | ICD-10-CM | POA: Insufficient documentation

## 2015-06-08 LAB — BASIC METABOLIC PANEL
ANION GAP: 8 (ref 5–15)
ANION GAP: 8 (ref 5–15)
Anion gap: 4 — ABNORMAL LOW (ref 5–15)
BUN: 74 mg/dL — AB (ref 6–20)
BUN: 70 mg/dL — ABNORMAL HIGH (ref 6–20)
BUN: 66 mg/dL — AB (ref 6–20)
CALCIUM: 8.8 mg/dL — AB (ref 8.9–10.3)
CALCIUM: 9 mg/dL (ref 8.9–10.3)
CALCIUM: 8.5 mg/dL — AB (ref 8.9–10.3)
CHLORIDE: 106 mmol/L (ref 101–111)
CO2: 29 mmol/L (ref 22–32)
CO2: 29 mmol/L (ref 22–32)
CO2: 30 mmol/L (ref 22–32)
CREATININE: 1.83 mg/dL — AB (ref 0.44–1.00)
CREATININE: 1.69 mg/dL — AB (ref 0.44–1.00)
Chloride: 104 mmol/L (ref 101–111)
Chloride: 104 mmol/L (ref 101–111)
Creatinine, Ser: 1.95 mg/dL — ABNORMAL HIGH (ref 0.44–1.00)
GFR calc Af Amer: 27 mL/min — ABNORMAL LOW (ref >60)
GFR calc Af Amer: 32 mL/min — ABNORMAL LOW (ref >60)
GFR calc non Af Amer: 28 mL/min — ABNORMAL LOW (ref >60)
GFR, EST AFRICAN AMERICAN: 29 mL/min — AB (ref >60)
GFR, EST NON AFRICAN AMERICAN: 23 mL/min — AB (ref >60)
GFR, EST NON AFRICAN AMERICAN: 25 mL/min — AB (ref >60)
Glucose, Bld: 151 mg/dL — ABNORMAL HIGH (ref 65–99)
Glucose, Bld: 137 mg/dL — ABNORMAL HIGH (ref 65–99)
Glucose, Bld: 134 mg/dL — ABNORMAL HIGH (ref 65–99)
POTASSIUM: 5.6 mmol/L — AB (ref 3.5–5.1)
Potassium: 5.9 mmol/L — ABNORMAL HIGH (ref 3.5–5.1)
Potassium: 5.5 mmol/L — ABNORMAL HIGH (ref 3.5–5.1)
SODIUM: 141 mmol/L (ref 135–145)
SODIUM: 141 mmol/L (ref 135–145)
SODIUM: 140 mmol/L (ref 135–145)

## 2015-06-08 LAB — CBC
HEMATOCRIT: 23 % — AB (ref 35.0–47.0)
Hemoglobin: 7.5 g/dL — ABNORMAL LOW (ref 12.0–16.0)
MCH: 26 pg (ref 26.0–34.0)
MCHC: 32.6 g/dL (ref 32.0–36.0)
MCV: 80 fL (ref 80.0–100.0)
Platelets: 199 10*3/uL (ref 150–440)
RBC: 2.87 MIL/uL — ABNORMAL LOW (ref 3.80–5.20)
RDW: 16.4 % — AB (ref 11.5–14.5)
WBC: 6 10*3/uL (ref 3.6–11.0)

## 2015-06-08 LAB — GLUCOSE, CAPILLARY
GLUCOSE-CAPILLARY: 137 mg/dL — AB (ref 65–99)
Glucose-Capillary: 110 mg/dL — ABNORMAL HIGH (ref 65–99)
Glucose-Capillary: 115 mg/dL — ABNORMAL HIGH (ref 65–99)
Glucose-Capillary: 131 mg/dL — ABNORMAL HIGH (ref 65–99)
Glucose-Capillary: 157 mg/dL — ABNORMAL HIGH (ref 65–99)

## 2015-06-08 LAB — BLOOD GAS, ARTERIAL
ALLENS TEST (PASS/FAIL): POSITIVE — AB
Acid-Base Excess: 4 mmol/L — ABNORMAL HIGH (ref 0.0–3.0)
Bicarbonate: 31.6 mEq/L — ABNORMAL HIGH (ref 21.0–28.0)
FIO2: 0.36
O2 Saturation: 93.6 %
PATIENT TEMPERATURE: 37
PH ART: 7.33 — AB (ref 7.350–7.450)
pCO2 arterial: 60 mmHg — ABNORMAL HIGH (ref 32.0–48.0)
pO2, Arterial: 74 mmHg — ABNORMAL LOW (ref 83.0–108.0)

## 2015-06-08 LAB — TSH: TSH: 1.316 u[IU]/mL (ref 0.350–4.500)

## 2015-06-08 LAB — PROCALCITONIN: PROCALCITONIN: 0.17 ng/mL

## 2015-06-08 LAB — SEDIMENTATION RATE: SED RATE: 107 mm/h — AB (ref 0–30)

## 2015-06-08 LAB — MRSA PCR SCREENING: MRSA by PCR: NEGATIVE

## 2015-06-08 MED ORDER — DIGOXIN 125 MCG PO TABS
0.1250 mg | ORAL_TABLET | Freq: Every day | ORAL | Status: DC
  Administered 2015-06-08 – 2015-06-09 (×2): 0.125 mg via ORAL
  Filled 2015-06-08 (×2): qty 1

## 2015-06-08 MED ORDER — VITAMIN B-12 1000 MCG PO TABS
500.0000 ug | ORAL_TABLET | Freq: Every day | ORAL | Status: DC
  Administered 2015-06-08 – 2015-06-15 (×8): 500 ug via ORAL
  Filled 2015-06-08 (×4): qty 1
  Filled 2015-06-08: qty 2
  Filled 2015-06-08 (×3): qty 1

## 2015-06-08 MED ORDER — FEBUXOSTAT 40 MG PO TABS
40.0000 mg | ORAL_TABLET | Freq: Every day | ORAL | Status: DC
  Administered 2015-06-08 – 2015-06-15 (×8): 40 mg via ORAL
  Filled 2015-06-08 (×8): qty 1

## 2015-06-08 MED ORDER — ALPRAZOLAM 0.25 MG PO TABS
0.2500 mg | ORAL_TABLET | Freq: Three times a day (TID) | ORAL | Status: DC | PRN
  Administered 2015-06-08 – 2015-06-15 (×12): 0.25 mg via ORAL
  Filled 2015-06-08 (×14): qty 1

## 2015-06-08 MED ORDER — DEXTROSE 5 % IV SOLN
5.0000 mg/h | INTRAVENOUS | Status: DC
  Administered 2015-06-08: 15 mg/h via INTRAVENOUS
  Administered 2015-06-08: 8 mg/h via INTRAVENOUS
  Administered 2015-06-08: 15 mg/h via INTRAVENOUS
  Administered 2015-06-09: 10 mg/h via INTRAVENOUS
  Administered 2015-06-09: 5 mg/h via INTRAVENOUS
  Filled 2015-06-08 (×5): qty 100

## 2015-06-08 MED ORDER — SERTRALINE HCL 25 MG PO TABS
25.0000 mg | ORAL_TABLET | Freq: Every day | ORAL | Status: DC
  Administered 2015-06-08 – 2015-06-15 (×8): 25 mg via ORAL
  Filled 2015-06-08 (×8): qty 1

## 2015-06-08 MED ORDER — INSULIN ASPART 100 UNIT/ML ~~LOC~~ SOLN
0.0000 [IU] | Freq: Three times a day (TID) | SUBCUTANEOUS | Status: DC
  Administered 2015-06-08 – 2015-06-11 (×5): 1 [IU] via SUBCUTANEOUS
  Administered 2015-06-11 – 2015-06-12 (×2): 2 [IU] via SUBCUTANEOUS
  Administered 2015-06-12 (×2): 1 [IU] via SUBCUTANEOUS
  Administered 2015-06-13: 2 [IU] via SUBCUTANEOUS
  Administered 2015-06-14 – 2015-06-15 (×3): 1 [IU] via SUBCUTANEOUS
  Filled 2015-06-08: qty 1
  Filled 2015-06-08: qty 2
  Filled 2015-06-08 (×2): qty 1
  Filled 2015-06-08: qty 2
  Filled 2015-06-08 (×6): qty 1
  Filled 2015-06-08: qty 2
  Filled 2015-06-08 (×2): qty 1

## 2015-06-08 MED ORDER — DOCUSATE SODIUM 100 MG PO CAPS: 100.0000 mg | ORAL_CAPSULE | Freq: Two times a day (BID) | ORAL | Status: DC | PRN

## 2015-06-08 MED ORDER — NITROGLYCERIN 0.4 MG SL SUBL: 0.4000 mg | SUBLINGUAL_TABLET | SUBLINGUAL | Status: DC | PRN

## 2015-06-08 MED ORDER — IPRATROPIUM-ALBUTEROL 0.5-2.5 (3) MG/3ML IN SOLN
3.0000 mL | RESPIRATORY_TRACT | Status: DC | PRN
  Filled 2015-06-08: qty 3

## 2015-06-08 MED ORDER — ASPIRIN EC 81 MG PO TBEC
81.0000 mg | DELAYED_RELEASE_TABLET | Freq: Every day | ORAL | Status: DC
  Administered 2015-06-08 – 2015-06-15 (×8): 81 mg via ORAL
  Filled 2015-06-08 (×8): qty 1

## 2015-06-08 MED ORDER — FAMOTIDINE 20 MG PO TABS
20.0000 mg | ORAL_TABLET | Freq: Every day | ORAL | Status: DC
  Administered 2015-06-08 – 2015-06-15 (×8): 20 mg via ORAL
  Filled 2015-06-08 (×8): qty 1

## 2015-06-08 MED ORDER — DIPHENHYDRAMINE HCL 50 MG/ML IJ SOLN: 12.5000 mg | Freq: Once | INTRAMUSCULAR | Status: DC

## 2015-06-08 MED ORDER — SENNA 8.6 MG PO TABS
1.0000 | ORAL_TABLET | Freq: Every day | ORAL | Status: DC | PRN
  Administered 2015-06-08: 8.6 mg via ORAL
  Filled 2015-06-08: qty 1

## 2015-06-08 MED ORDER — DILTIAZEM HCL ER 180 MG PO CP24
180.0000 mg | ORAL_CAPSULE | Freq: Every day | ORAL | Status: DC
  Administered 2015-06-08 – 2015-06-10 (×3): 180 mg via ORAL
  Filled 2015-06-08 (×6): qty 1

## 2015-06-08 MED ORDER — METOPROLOL SUCCINATE ER 100 MG PO TB24
100.0000 mg | ORAL_TABLET | Freq: Every day | ORAL | Status: DC
  Administered 2015-06-08 – 2015-06-15 (×8): 100 mg via ORAL
  Filled 2015-06-08 (×8): qty 1

## 2015-06-08 MED ORDER — VITAMIN C 500 MG PO TABS
500.0000 mg | ORAL_TABLET | Freq: Every day | ORAL | Status: DC
  Administered 2015-06-08 – 2015-06-15 (×8): 500 mg via ORAL
  Filled 2015-06-08 (×8): qty 1

## 2015-06-08 MED ORDER — IPRATROPIUM-ALBUTEROL 0.5-2.5 (3) MG/3ML IN SOLN
3.0000 mL | Freq: Four times a day (QID) | RESPIRATORY_TRACT | Status: DC
  Administered 2015-06-08 – 2015-06-11 (×12): 3 mL via RESPIRATORY_TRACT
  Filled 2015-06-08 (×12): qty 3

## 2015-06-08 MED ORDER — INSULIN ASPART 100 UNIT/ML ~~LOC~~ SOLN: 0.0000 [IU] | Freq: Every day | SUBCUTANEOUS | Status: DC

## 2015-06-08 MED ORDER — OCUVITE-LUTEIN PO CAPS
1.0000 | ORAL_CAPSULE | Freq: Every day | ORAL | Status: DC
  Administered 2015-06-08 – 2015-06-15 (×8): 1 via ORAL
  Filled 2015-06-08 (×8): qty 1

## 2015-06-08 MED ORDER — ATORVASTATIN CALCIUM 10 MG PO TABS
10.0000 mg | ORAL_TABLET | Freq: Every day | ORAL | Status: DC
  Administered 2015-06-08 – 2015-06-14 (×7): 10 mg via ORAL
  Filled 2015-06-08 (×7): qty 1

## 2015-06-08 MED ORDER — CETYLPYRIDINIUM CHLORIDE 0.05 % MT LIQD: 7.0000 mL | Freq: Two times a day (BID) | OROMUCOSAL | Status: DC

## 2015-06-08 MED ORDER — CLOPIDOGREL BISULFATE 75 MG PO TABS
75.0000 mg | ORAL_TABLET | Freq: Every day | ORAL | Status: DC
  Administered 2015-06-08 – 2015-06-15 (×8): 75 mg via ORAL
  Filled 2015-06-08 (×8): qty 1

## 2015-06-08 MED ORDER — BUDESONIDE 0.25 MG/2ML IN SUSP
0.2500 mg | Freq: Four times a day (QID) | RESPIRATORY_TRACT | Status: DC
  Administered 2015-06-08 – 2015-06-11 (×11): 0.25 mg via RESPIRATORY_TRACT
  Filled 2015-06-08 (×11): qty 2

## 2015-06-08 MED ORDER — FERROUS SULFATE 325 (65 FE) MG PO TABS
325.0000 mg | ORAL_TABLET | Freq: Every day | ORAL | Status: DC
  Administered 2015-06-08 – 2015-06-15 (×8): 325 mg via ORAL
  Filled 2015-06-08 (×8): qty 1

## 2015-06-08 MED ORDER — CHOLECALCIFEROL 10 MCG (400 UNIT) PO TABS
400.0000 [IU] | ORAL_TABLET | Freq: Every day | ORAL | Status: DC
  Administered 2015-06-08 – 2015-06-15 (×8): 400 [IU] via ORAL
  Filled 2015-06-08 (×8): qty 1

## 2015-06-08 NOTE — Clinical Social Work Note (Signed)
Patient just came in today from [facility]. Patient is known to me from a previous admission in which she was placed at [facility]. Patient is currently not breathing well and CSW will return to attempt to speak with her tomorrow. FL2 placed on chart.  Shela Leff MSW,LCSW [phone]

## 2015-06-08 NOTE — Progress Notes (Signed)
PROGRESS NOTE  Regina Burke:[id number] DOB: 1933/08/14 DOA: 06/07/2015 PCP: Tracie Harrier, MD  Subjective 81 y/o f with hx of Chronic A-fib, Ca  Lung, Chronic  Hypoxemic and Hypercapnic Respiratory Failure, HTN, OSA, Type 2 DM admitted with  Hyperkalemia , acute Renal failure , A-fib with RVR This am appears  Weak. Short of breath with minimal exertion Renal function has improved     Objective: BP 137/59 mmHg  Pulse 95  Temp(Src) 98.5 F (36.9 C) (Oral)  Resp 20  Ht '5\' 5"'$  (1.651 m)  Wt 73.483 kg (162 lb)  BMI 26.96 kg/m2  SpO2 96%  Intake/Output Summary (Last 24 hours) at 06/08/15 1229 Last data filed at 06/08/15 0700  Gross per 24 hour  Intake 165.17 ml  Output      0 ml  Net 165.17 ml   Filed Weights   06/07/15 1914  Weight: 73.483 kg (162 lb)    Exam:  General: Pale looking. Dyspneic art rest  HEENT: PERRL; OP moist without lesions. Neck: supple, trachea midline, no thyromegaly Chest: normal to palpation Lungs: clear bilaterally without retractions or wheezes Cardiovascular:Irregular rate  Abdomen: soft, nontender, nondistended, positive bowel sounds Extremities: no clubbing, cyanosis, edema Neuro: alert and oriented, moves all extremities Derm: Pressure ulcer left butttock Lymph: no cervical or supraclavicular lymphadenopathy   Labs and imaging studies were reviewed*  Data Reviewed: Basic Metabolic Panel:  Recent Labs Lab 06/07/15 1939 06/08/15 0405 06/08/15 1003  NA 138 141 141  K 6.2* 5.6* 5.9*  CL 100* 104 104  CO2 '28 29 29  '$ GLUCOSE 159* 151* 137*  BUN 80* 74* 70*  CREATININE 2.32* 1.95* 1.83*  CALCIUM 9.1 8.8* 9.0   Liver Function Tests:  Recent Labs Lab 06/07/15 1939  AST 15  ALT 14  ALKPHOS 60  BILITOT 0.7  PROT 6.9  ALBUMIN 3.5   No results for input(s): LIPASE, AMYLASE in the last 168 hours. No results for input(s): AMMONIA in the last 168 hours. CBC:  Recent Labs Lab 06/07/15 1939 06/08/15 0405  WBC 6.4  6.0  NEUTROABS 5.5  --   HGB 8.3* 7.5*  HCT 25.2* 23.0*  MCV 80.3 80.0  PLT 217 199   Cardiac Enzymes:    Recent Labs Lab 06/07/15 1939  TROPONINI 0.03   BNP (last 3 results)  Recent Labs  12/23/14 0434 05/15/15 1233 06/07/15 1939  BNP 213.8* 560.0* 941.0*    ProBNP (last 3 results) No results for input(s): PROBNP in the last 8760 hours.  CBG:  Recent Labs Lab 06/08/15 0002 06/08/15 0716 06/08/15 1126  GLUCAP 157* 131* 115*    Recent Results (from the past 240 hour(s))  MRSA PCR Screening     Status: None   Collection Time: 06/08/15 12:30 AM  Result Value Ref Range Status   MRSA by PCR NEGATIVE NEGATIVE Final    Comment:        The GeneXpert MRSA Assay (FDA approved for NASAL specimens only), is one component of a comprehensive MRSA colonization surveillance program. It is not intended to diagnose MRSA infection nor to guide or monitor treatment for MRSA infections.      Studies: Dg Chest Portable 1 View  06/07/2015   CLINICAL DATA:  Wheezing and cough, shortness of breath. History of COPD, CHF, hypertension, lung cancer in 2014.  EXAM: PORTABLE CHEST 1 VIEW  COMPARISON:  Chest x-ray dated 05/20/2015.  FINDINGS: Cardiomediastinal silhouette appears stable in size and configuration. Cardiomegaly is grossly unchanged. Again noted is  central pulmonary vascular congestion and bibasilar airspace opacities. Overall appearance of the lungs is unchanged. Left-sided central line has been removed in the interval. Surgical changes again seen over the right upper lung.  IMPRESSION: Overall stable appearance of the chest x-ray. Again noted is cardiomegaly with central pulmonary vascular congestion suggesting volume overload/CHF (persistent versus recurrent).  Bibasilar opacities are also unchanged and probably represent a combination of atelectasis and bibasilar pulmonary edema with adjacent pleural effusions. As also stated on the previous report, bibasilar pneumonia  cannot be confidently excluded.   Electronically Signed   By: Franki Cabot M.D.   On: 06/07/2015 19:58    Scheduled Meds: . antiseptic oral rinse  7 mL Mouth Rinse BID  . aspirin EC  81 mg Oral Daily  . atorvastatin  10 mg Oral QHS  . cholecalciferol  400 Units Oral Daily  . clopidogrel  75 mg Oral Daily  . digoxin  0.125 mg Oral Daily  . diltiazem  180 mg Oral Daily  . diphenhydrAMINE  12.5 mg Intravenous Once  . famotidine  20 mg Oral Daily  . febuxostat  40 mg Oral Daily  . ferrous sulfate  325 mg Oral Daily  . heparin  5,000 Units Subcutaneous 3 times per day  . insulin aspart  0-5 Units Subcutaneous QHS  . insulin aspart  0-9 Units Subcutaneous TID WC  . metoprolol succinate  100 mg Oral Daily  . multivitamin-lutein  1 capsule Oral Daily  . sertraline  25 mg Oral Daily  . sodium chloride  3 mL Intravenous Q12H  . vitamin B-12  500 mcg Oral Daily  . vitamin C  500 mg Oral Daily    Continuous Infusions: . sodium chloride    . diltiazem (CARDIZEM) infusion 8 mg/hr (06/08/15 1143)    Assessment/Plan:   1 Atrial fibrillation, with RVR:; On IV Cardizem, and Po dig   2 Acute kidney injury ([place]); Improving  3 Hyperkalemia;  Received Kayexalate- Se K is 5.9-Continue to monitor  4 Hypercapnic and Hypoxemic respiratory failure- On 4 L n/c O2. Check ABG May need BIPAP  5 Pressure ulcer Left buttock; Watch for now   6 Anemia; [place] closely.- Transfuse if necessary  7 Type 2 DM; On SSI   Code Status: Full code  Family Communication: son and daughter        06/08/2015, 12:29 PM  LOS: 1 day

## 2015-06-08 NOTE — Consult Note (Signed)
PULMONARY/CCM CONSULT NOTE  Date of admission: 06/07/15 Date of consult: 10/11 Reason for consultation: resp distress  HPI:  Regina Burke with adenocarcinoma of lung diagnosed Apr 2015 and treated with XRT. Recent hospitalization 9/17 - 9/26. DC diagnoses @ that time were acute on chronic resp failure, PAF with RVR, COPD, lung Ca, DM 2, chronic anemia, anxiety. She was discharged to [facility] NH and was undergoing rehab with some success. Admitted 10/10 wit 1-2 days of increased dyspnea and found to have AFRVR and severe hyperkalemia. She has had cough without much sputum and with no hemoptysis. She denies fever, LE edema, PND and orthopnea. Reports centrally located CP - poorly characterized.      Past Medical History  Diagnosis Date  . COPD (chronic obstructive pulmonary disease) ([place])   . CHF (congestive heart failure) ([place])   . Hypertension   . Rapid palpitations 2014    Seen at [facility], may have been atrial fibrillation  . DOE (dyspnea on exertion)     Started after treatment for lung cancer  . High cholesterol   . Heart murmur   . Myocardial infarction ([place]) 12/22/2014   . Lung cancer ([place]) dx'd 2014    S/P radiation 2015  . Sleep apnea   . Cushing's disease ([place])   . Type II diabetes mellitus ([place])   . Anemia   . GERD (gastroesophageal reflux disease)   . History of hiatal hernia   . Migraine   . Arthritis   . Gout   . Depression   . Chronic kidney disease (CKD), stage III (moderate)    Past Surgical History  Procedure Laterality Date  . Adrenalectomy Left 1980's    "Cushings"  . Abdominal hysterectomy    . Appendectomy    . Cholecystectomy    . Coronary angioplasty with stent placement  12/23/2014  . Tonsillectomy    . Fracture surgery    . Wrist fracture surgery Bilateral ~ 2000  . Breast cyst excision Left   . Tubal ligation    . Left heart catheterization with coronary angiogram N/A 12/23/2014    Procedure: LEFT HEART CATHETERIZATION WITH CORONARY ANGIOGRAM;  Surgeon:  Burnell Blanks, MD;  Location: [facility] CATH LAB;  Service: Cardiovascular;  Laterality: N/A;  . Percutaneous coronary stent intervention (pci-s)  12/23/2014    Procedure: PERCUTANEOUS CORONARY STENT INTERVENTION (PCI-S);  Surgeon: Burnell Blanks, MD;  Location: [facility] CATH LAB;  Service: Cardiovascular;;  Promus 2.25x8    MEDICATIONS: reviewed    Social History   Social History  . Marital Status: Widowed    Spouse Name: N/A  . Number of Children: N/A  . Years of Education: N/A   Occupational History  . Not on file.   Social History Main Topics  . Smoking status: Former Smoker -- 1.00 packs/day for 45 years    Types: Cigarettes    Quit date: 04/25/1998  . Smokeless tobacco: Never Used  . Alcohol Use: Yes     Comment: 12/23/2014 "might have a couple mixed drinks/year"  . Drug Use: No  . Sexual Activity: No   Other Topics Concern  . Not on file   Social History Narrative    Family History  Problem Relation Age of Onset  . Heart disease Mother   . Diabetes Mother   . Osteoarthritis Mother   . Hypertension Mother   . Heart disease Father   . Hypertension Father   . COPD Brother     ROS - as above. Otherwise, a  detailed ROS was negative  Filed Vitals:   06/08/15 1100 06/08/15 1200 06/08/15 1300 06/08/15 1400  BP: 128/52 117/45 131/Regina 152/54  Pulse: 92 95 91 84  Temp:      TempSrc:      Resp: _0 Height:      Weight:      SpO2: 99% 99% 98% 92%    EXAM:  Gen: frail and fatigued-appearing, mildly labored resp effort HEENT: NCAT, EOMI, PERRL Neck: No JVD Lungs: diminished BS throughout, no wheezes Cardiovascular: IRIR, rate controlled Abdomen: soft, NT, +BS Ext: no C/C/E Neuro: no focal deficits  DATA:  BMP Latest Ref Rng 06/08/2015 06/08/2015 06/07/2015  Glucose 65 - 99 mg/dL 137(H) 151(H) 159(H)  BUN 6 - 20 mg/dL 70(H) 74(H) 80(H)  Creatinine 0.44 - 1.00 mg/dL 1.83(H) 1.95(H) 2.32(H)  Sodium 135 - 145 mmol/L 141 141 138  Potassium 3.5  - 5.1 mmol/L 5.9(H) 5.6(H) 6.2(H)  Chloride 101 - 111 mmol/L 104 104 100(L)  CO2 22 - 32 mmol/L _1 Calcium 8.9 - 10.3 mg/dL 9.0 8.8(L) 9.1    CBC Latest Ref Rng 06/08/2015 06/07/2015 05/22/2015  WBC 3.6 - 11.0 K/uL 6.0 6.4 7.6  Hemoglobin 12.0 - 16.0 g/dL 7.5(L) 8.3(L) 8.9(L)  Hematocrit 35.0 - 47.0 % 23.0(L) 25.2(L) 27.0(L)  Platelets 150 - 440 K/uL 199 217 198   CXR: bibasilar infiltrates and/or effusions, RUL nodule  IMPRESSION:   Acute on chronic respiratory failure - etiology is not entirely clear. Her CXR is not markedly different than previous film. There does not seem to be acute bronchospasm. There might be a component of edema noting elevated BNP. The film could be consistent with PNA but her history is not compelling for this. She does note that her resp status worsened at the time of XRT but the CXR does not suggest radiation pneumonitis or fibrosis. There might be an anxiety overlay as well  Other problems include AFRVR, now controlled on diltiazem gtt and AKI  PLAN:  ESR PCT CT chest without contrast due to AKI PRN BiPAP Scheduled bronchodilators Mgmt of AF per Cards and primary team Decreased NS infusion rate Consider diuresis as tolerated  Merton Border, MD PCCM service Mobile [phone] Pager [phone]

## 2015-06-09 ENCOUNTER — Inpatient Hospital Stay: Payer: Medicare Other

## 2015-06-09 DIAGNOSIS — I2510 Atherosclerotic heart disease of native coronary artery without angina pectoris: Secondary | ICD-10-CM | POA: Insufficient documentation

## 2015-06-09 DIAGNOSIS — N179 Acute kidney failure, unspecified: Principal | ICD-10-CM

## 2015-06-09 DIAGNOSIS — Z85118 Personal history of other malignant neoplasm of bronchus and lung: Secondary | ICD-10-CM | POA: Insufficient documentation

## 2015-06-09 DIAGNOSIS — E875 Hyperkalemia: Secondary | ICD-10-CM

## 2015-06-09 DIAGNOSIS — Z515 Encounter for palliative care: Secondary | ICD-10-CM

## 2015-06-09 DIAGNOSIS — J80 Acute respiratory distress syndrome: Secondary | ICD-10-CM

## 2015-06-09 DIAGNOSIS — Z923 Personal history of irradiation: Secondary | ICD-10-CM

## 2015-06-09 DIAGNOSIS — Z955 Presence of coronary angioplasty implant and graft: Secondary | ICD-10-CM

## 2015-06-09 DIAGNOSIS — C3411 Malignant neoplasm of upper lobe, right bronchus or lung: Secondary | ICD-10-CM

## 2015-06-09 DIAGNOSIS — I4891 Unspecified atrial fibrillation: Secondary | ICD-10-CM

## 2015-06-09 DIAGNOSIS — D649 Anemia, unspecified: Secondary | ICD-10-CM

## 2015-06-09 DIAGNOSIS — J9601 Acute respiratory failure with hypoxia: Secondary | ICD-10-CM | POA: Insufficient documentation

## 2015-06-09 DIAGNOSIS — R4182 Altered mental status, unspecified: Secondary | ICD-10-CM

## 2015-06-09 DIAGNOSIS — J90 Pleural effusion, not elsewhere classified: Secondary | ICD-10-CM

## 2015-06-09 LAB — GLUCOSE, CAPILLARY
GLUCOSE-CAPILLARY: 137 mg/dL — AB (ref 65–99)
GLUCOSE-CAPILLARY: 115 mg/dL — AB (ref 65–99)
Glucose-Capillary: 87 mg/dL (ref 65–99)
Glucose-Capillary: 130 mg/dL — ABNORMAL HIGH (ref 65–99)

## 2015-06-09 LAB — CBC WITH DIFFERENTIAL/PLATELET
BASOS PCT: 1 %
Basophils Absolute: 0 10*3/uL (ref 0–0.1)
EOS ABS: 0.2 10*3/uL (ref 0–0.7)
EOS PCT: 5 %
HCT: 21 % — ABNORMAL LOW (ref 35.0–47.0)
HEMOGLOBIN: 6.7 g/dL — AB (ref 12.0–16.0)
Lymphocytes Relative: 18 %
Lymphs Abs: 0.6 10*3/uL — ABNORMAL LOW (ref 1.0–3.6)
MCH: 25.7 pg — ABNORMAL LOW (ref 26.0–34.0)
MCHC: 32 g/dL (ref 32.0–36.0)
MCV: 80.3 fL (ref 80.0–100.0)
MONOS PCT: 8 %
Monocytes Absolute: 0.3 10*3/uL (ref 0.2–0.9)
NEUTROS PCT: 68 %
Neutro Abs: 2.5 10*3/uL (ref 1.4–6.5)
PLATELETS: 199 10*3/uL (ref 150–440)
RBC: 2.61 MIL/uL — AB (ref 3.80–5.20)
RDW: 16.6 % — ABNORMAL HIGH (ref 11.5–14.5)
WBC: 3.6 10*3/uL (ref 3.6–11.0)

## 2015-06-09 LAB — BASIC METABOLIC PANEL
Anion gap: 8 (ref 5–15)
BUN: 63 mg/dL — AB (ref 6–20)
CHLORIDE: 106 mmol/L (ref 101–111)
CO2: 32 mmol/L (ref 22–32)
CREATININE: 1.7 mg/dL — AB (ref 0.44–1.00)
Calcium: 9.1 mg/dL (ref 8.9–10.3)
GFR, EST AFRICAN AMERICAN: 32 mL/min — AB (ref >60)
GFR, EST NON AFRICAN AMERICAN: 27 mL/min — AB (ref >60)
Glucose, Bld: 92 mg/dL (ref 65–99)
Potassium: 5.2 mmol/L — ABNORMAL HIGH (ref 3.5–5.1)
SODIUM: 146 mmol/L — AB (ref 135–145)

## 2015-06-09 LAB — PREPARE RBC (CROSSMATCH)

## 2015-06-09 LAB — ABO/RH: ABO/RH(D): A POS

## 2015-06-09 MED ORDER — CHLORHEXIDINE GLUCONATE 0.12 % MT SOLN
15.0000 mL | Freq: Two times a day (BID) | OROMUCOSAL | Status: DC
  Administered 2015-06-09 – 2015-06-15 (×10): 15 mL via OROMUCOSAL
  Filled 2015-06-09 (×7): qty 15

## 2015-06-09 MED ORDER — DILTIAZEM HCL 60 MG PO TABS
60.0000 mg | ORAL_TABLET | Freq: Once | ORAL | Status: AC
  Administered 2015-06-09: 60 mg via ORAL
  Filled 2015-06-09: qty 1

## 2015-06-09 MED ORDER — SODIUM POLYSTYRENE SULFONATE 15 GM/60ML PO SUSP
30.0000 g | Freq: Once | ORAL | Status: AC
  Administered 2015-06-09: 30 g via ORAL
  Filled 2015-06-09: qty 120

## 2015-06-09 MED ORDER — CETYLPYRIDINIUM CHLORIDE 0.05 % MT LIQD
7.0000 mL | Freq: Two times a day (BID) | OROMUCOSAL | Status: DC
  Administered 2015-06-09 – 2015-06-14 (×10): 7 mL via OROMUCOSAL

## 2015-06-09 MED ORDER — SODIUM CHLORIDE 0.9 % IV SOLN
Freq: Once | INTRAVENOUS | Status: AC
  Administered 2015-06-09: 18:00:00 via INTRAVENOUS

## 2015-06-09 NOTE — Progress Notes (Signed)
Patient alert and oriented. Vitals stable- controlled afib on monitor- placed back cardizem drip this afternoon after failed attempt with cardizem '60mg'$  once. Cardiology consult placed.  Patient given kayexelate for potassium 5.2- Hgb 6.7- patient receiving 1 unit of blood at this time. Patient had PICC placed.  Pt eating at this time.

## 2015-06-09 NOTE — Progress Notes (Signed)
Notifed Dr. Ginette Pitman about potassium level and hgb level- order for kayexelate and to tranfuse 1 unit of blood- and to hold off of blood transfusion until lunch time when Dr. Ginette Pitman can come by and explain about need for blood transfusion and to sign consent.

## 2015-06-09 NOTE — Consult Note (Signed)
Seen on prior admissions seen on  Cardi priorology Consultation Note  Patient ID: Regina Burke, MRN: [id number], DOB/AGE: Nov 06, 1932 81 y.o. Admit date: 06/07/2015   Date of Consult: 06/09/2015 Primary Physician: Tracie Harrier, MD Primary Cardiologist: tom Claiborne Billings, [facility]  Chief Complaint: Shortness of breath  Reason for Consult: Atrial fibrillation   HPI: 81 y.o. female with past medical history including coronary artery disease, paroxysmal atrial fibrillation, COPD, lung cancer, diabetes mellitus, and admitted to [facility] hospital on 12/22/2014 with chest pain,  elective cardiac catheterization found to have mild 50% disease in the diagonal branch of the LAD, but high-grade 90% hazy stenosis in the marginal branch of the circumflex,  successful PTCA and insertion of a 2.25x 8 millimeter Promus premier DES stent at the ostium of the OM3 vessel, started on aspirin and Plavix, hospital admission in August for electrolyte abnormality, digoxin toxicity, readmitted 06/07/2015 with shortness of breath, found to have atrial fibrillation , potassium of 7, renal failure .  Over the past 2 days, she has had a rapid decline in her blood count, currently receiving packed red blood cells this evening . Currently on digoxin for heart rate control for her atrial fibrillation .  she is on BiPAP at nighttime . Potassium level has improved down to 5.2, creatinine still elevated 1.7 with elevated BUN   drop in her blood count down to hemoglobin 6.7  CT scan showing bilateral pleural effusions right greater than left, compressive atelectasis right greater than left  She reports that she was not happy at [employer]. Was not eating or drinking, does not like having a roommate.  Digoxin level yesterday was greater than 2  Past Medical History  Diagnosis Date  . COPD (chronic obstructive pulmonary disease) ([place])   . CHF (congestive heart failure) ([place])   . Hypertension   . Rapid palpitations 2014   Seen at [facility], may have been atrial fibrillation  . DOE (dyspnea on exertion)     Started after treatment for lung cancer  . High cholesterol   . Heart murmur   . Myocardial infarction ([place]) 12/22/2014   . Lung cancer ([place]) dx'd 2014    S/P radiation 2015  . Sleep apnea   . Cushing's disease ([place])   . Type II diabetes mellitus ([place])   . Anemia   . GERD (gastroesophageal reflux disease)   . History of hiatal hernia   . Migraine   . Arthritis   . Gout   . Depression   . Chronic kidney disease (CKD), stage III (moderate)       Most Recent Cardiac Studies: Echocardiogram 05/17/2015 showing normal LV function, EF greater than 55%, left atrium mildly dilated    Surgical History:  Past Surgical History  Procedure Laterality Date  . Adrenalectomy Left 1980's    "[place]"  . Abdominal hysterectomy    . Appendectomy    . Cholecystectomy    . Coronary angioplasty with stent placement  12/23/2014  . Tonsillectomy    . Fracture surgery    . Wrist fracture surgery Bilateral ~ 2000  . Breast cyst excision Left   . Tubal ligation    . Left heart catheterization with coronary angiogram N/A 12/23/2014    Procedure: LEFT HEART CATHETERIZATION WITH CORONARY ANGIOGRAM;  Surgeon: Burnell Blanks, MD;  Location: [facility] CATH LAB;  Service: Cardiovascular;  Laterality: N/A;  . Percutaneous coronary stent intervention (pci-s)  12/23/2014    Procedure: PERCUTANEOUS CORONARY STENT INTERVENTION (PCI-S);  Surgeon: Burnell Blanks, MD;  Location: [facility] CATH LAB;  Service: Cardiovascular;;  Promus 2.25x8     Home Meds: Prior to Admission medications   Medication Sig Start Date End Date Taking? Authorizing Provider  ALPRAZolam (XANAX) 0.25 MG tablet Take 1 tablet (0.25 mg total) by mouth 3 (three) times daily as needed for anxiety. 05/24/15  Yes Vishwanath Hande, MD  aspirin EC 81 MG tablet Take 1 tablet (81 mg total) by mouth daily. 04/13/15  Yes Troy Sine, MD  atorvastatin (LIPITOR) 10 MG  tablet Take 10 mg by mouth daily.   Yes Historical Provider, MD  cholecalciferol (VITAMIN D) 400 UNITS TABS tablet Take 400 Units by mouth daily.   Yes Historical Provider, MD  clopidogrel (PLAVIX) 75 MG tablet Take 1 tablet (75 mg total) by mouth daily. 12/24/14  Yes Rhonda G Barrett, PA-C  Cyanocobalamin (VITAMIN B-12) 500 MCG SUBL Place 500 mcg under the tongue daily.   Yes Historical Provider, MD  digoxin (DIGITEK) 0.125 MG tablet Take 0.125 mg by mouth daily.   Yes Historical Provider, MD  diltiazem (DILACOR XR) 180 MG 24 hr capsule Take 180 mg by mouth daily.   Yes Historical Provider, MD  docusate sodium (COLACE) 100 MG capsule Take 1 capsule (100 mg total) by mouth 2 (two) times daily as needed for mild constipation. 05/24/15  Yes Vishwanath Hande, MD  famotidine (PEPCID) 20 MG tablet Take 1 tablet (20 mg total) by mouth daily. 05/24/15  Yes Vishwanath Hande, MD  febuxostat (ULORIC) 40 MG tablet Take 40 mg by mouth daily.    Yes Historical Provider, MD  fenofibrate (TRICOR) 48 MG tablet Take 48 mg by mouth daily.   Yes Historical Provider, MD  ferrous sulfate 325 (65 FE) MG EC tablet Take 325 mg by mouth daily.    Yes Historical Provider, MD  ipratropium-albuterol (DUONEB) 0.5-2.5 (3) MG/3ML SOLN Take 3 mLs by nebulization every 4 (four) hours as needed. 05/24/15  Yes Vishwanath Hande, MD  metFORMIN (GLUCOPHAGE) 500 MG tablet Take 500 mg by mouth 2 (two) times daily with a meal.   Yes Historical Provider, MD  metoprolol succinate (TOPROL-XL) 100 MG 24 hr tablet Take 1 tablet (100 mg total) by mouth daily. Take with or immediately following a meal. 12/24/14  Yes Rhonda G Barrett, PA-C  Multiple Vitamins-Minerals (PRESERVISION AREDS 2) CAPS Take 1 capsule by mouth daily.   Yes Historical Provider, MD  nitroGLYCERIN (NITROSTAT) 0.4 MG SL tablet Place 1 tablet (0.4 mg total) under the tongue every 5 (five) minutes as needed for chest pain. 12/24/14  Yes Rhonda G Barrett, PA-C  senna (SENOKOT) 8.6 MG  TABS tablet Take 1 tablet (8.6 mg total) by mouth daily as needed for mild constipation. 05/24/15  Yes Vishwanath Hande, MD  sertraline (ZOLOFT) 25 MG tablet Take 25 mg by mouth daily.   Yes Historical Provider, MD  traMADol (ULTRAM) 50 MG tablet Take 1 tablet by mouth 2 (two) times daily. 06/04/15  Yes Historical Provider, MD  vitamin C (ASCORBIC ACID) 500 MG tablet Take 500 mg by mouth daily.   Yes Historical Provider, MD    Inpatient Medications:  . antiseptic oral rinse  7 mL Mouth Rinse q12n4p  . aspirin EC  81 mg Oral Daily  . atorvastatin  10 mg Oral QHS  . budesonide  0.25 mg Nebulization 4 times per day  . chlorhexidine  15 mL Mouth Rinse BID  . cholecalciferol  400 Units Oral Daily  . clopidogrel  75 mg Oral Daily  .  diltiazem  180 mg Oral Daily  . diphenhydrAMINE  12.5 mg Intravenous Once  . famotidine  20 mg Oral Daily  . febuxostat  40 mg Oral Daily  . ferrous sulfate  325 mg Oral Daily  . heparin  5,000 Units Subcutaneous 3 times per day  . insulin aspart  0-5 Units Subcutaneous QHS  . insulin aspart  0-9 Units Subcutaneous TID WC  . ipratropium-albuterol  3 mL Nebulization Q6H  . metoprolol succinate  100 mg Oral Daily  . multivitamin-lutein  1 capsule Oral Daily  . sertraline  25 mg Oral Daily  . sodium chloride  3 mL Intravenous Q12H  . vitamin B-12  500 mcg Oral Daily  . vitamin C  500 mg Oral Daily   . sodium chloride 10 mL/hr at 06/09/15 1503  . diltiazem (CARDIZEM) infusion 10 mg/hr (06/09/15 1343)    Allergies:  Allergies  Allergen Reactions  . Ciprofloxacin Shortness Of Breath, Itching and Rash  . Doxycycline Shortness Of Breath, Itching and Rash  . Penicillins Shortness Of Breath, Itching and Rash  . Sulfa Antibiotics Shortness Of Breath, Itching and Rash  . Morphine And Related Itching    Social History   Social History  . Marital Status: Widowed    Spouse Name: N/A  . Number of Children: N/A  . Years of Education: N/A   Occupational History    . Not on file.   Social History Main Topics  . Smoking status: Former Smoker -- 1.00 packs/day for 45 years    Types: Cigarettes    Quit date: 04/25/1998  . Smokeless tobacco: Never Used  . Alcohol Use: Yes     Comment: 12/23/2014 "might have a couple mixed drinks/year"  . Drug Use: No  . Sexual Activity: No   Other Topics Concern  . Not on file   Social History Narrative     Family History  Problem Relation Age of Onset  . Heart disease Mother   . Diabetes Mother   . Osteoarthritis Mother   . Hypertension Mother   . Heart disease Father   . Hypertension Father   . COPD Brother      Review of Systems: Review of Systems  Constitutional: Positive for malaise/fatigue.  Respiratory: Positive for shortness of breath.   Cardiovascular: Negative.   Gastrointestinal: Negative.   Musculoskeletal: Negative.   Neurological: Positive for weakness.  Psychiatric/Behavioral: Positive for depression.  All other systems reviewed and are negative.   Labs:  Recent Labs  06/07/15 1939  TROPONINI 0.03   Lab Results  Component Value Date   WBC 3.6 06/09/2015   HGB 6.7* 06/09/2015   HCT 21.0* 06/09/2015   MCV 80.3 06/09/2015   PLT 199 06/09/2015    Recent Labs Lab 06/07/15 1939  06/09/15 0418  NA 138  < > 146*  K 6.2*  < > 5.2*  CL 100*  < > 106  CO2 28  < > 32  BUN 80*  < > 63*  CREATININE 2.32*  < > 1.70*  CALCIUM 9.1  < > 9.1  PROT 6.9  --   --   BILITOT 0.7  --   --   ALKPHOS 60  --   --   ALT 14  --   --   AST 15  --   --   GLUCOSE 159*  < > 92  < > = values in this interval not displayed. Lab Results  Component Value Date   CHOL 202* 12/23/2014  HDL 28* 12/23/2014   LDLCALC 107* 12/23/2014   TRIG 337* 12/23/2014   No results found for: DDIMER  Radiology/Studies:  Dg Chest 1 View  05/20/2015  CLINICAL DATA:  Shortness of breath. EXAM: CHEST  1 VIEW COMPARISON:  05/17/2015. FINDINGS: Left IJ line in good anatomic position. Mediastinum hilar  structures normal. Cardiomegaly with bilateral pulmonary alveolar infiltrates and pleural effusions consistent congestive heart failure. Bilateral pneumonia cannot be excluded. No pneumothorax. Surgical clips right upper chest. IMPRESSION: 1. Lines and tubes in stable position. 2. Cardiomegaly with bilateral basilar pulmonary alveolar infiltrates and pleural effusions consistent with congestive heart failure. Bibasilar pneumonia cannot be excluded. Electronically Signed   By: Marcello Moores  Register   On: 05/20/2015 07:15   Dg Abd 1 View  05/16/2015  CLINICAL DATA:  Encounter for orogastric tube placement. EXAM: ABDOMEN - 1 VIEW COMPARISON:  None. FINDINGS: Tip of the orogastric tube extends well below the left hemidiaphragm consistent with it being in the mid stomach. There are surgical vascular clips in left upper quadrant. Normal bowel gas pattern. IMPRESSION: Orogastric tube tip projects within the mid stomach. Electronically Signed   By: Lajean Manes M.D.   On: 05/16/2015 21:52   Ct Chest Wo Contrast  06/08/2015  CLINICAL DATA:  Personal history of adenocarcinoma of lung it diagnosed April 2015 and treated with radiation therapy, hospitalized in September with respiratory failure, now with increased dyspnea and central chest pain EXAM: CT CHEST WITHOUT CONTRAST TECHNIQUE: Multidetector CT imaging of the chest was performed following the standard protocol without IV contrast. COMPARISON:  06/07/2015, 06/01/2014 FINDINGS: There is moderate left pleural effusion. The left upper lobe is clear except for mild apical scarring. There is anticipated compressive dependent atelectasis on the left. There is a moderate partially loculated pleural effusion on the right. There is more extensive presumably compressive atelectasis at the right lung base as compared to the left. There is postsurgical change with evidence of radiation fibrosis medially in the right upper lobe. Beam attenuation artifact is present as the result  of right apical clips, and obscures underlying parenchyma. There is a 7 x 23m central opacity that may represent a combination of fibrosis and residual tumor. Limited study without contrast shows no significant hilar or mediastinal adenopathy. There is cardiac enlargement. There is no significant pericardial effusion. There is coronary artery calcification as well as aortic calcification. No abnormalities involving the airway. Thoracic inlet and thyroid are normal. There are no acute musculoskeletal findings. Images to the upper abdomen partially demonstrate a known right renal cyst as well as a right adrenal mass. Large low-attenuation splenic lesion again identified. Hyper attenuating left renal lesion partially visualized and shows no change from prior study. IMPRESSION: Significant bilateral pleural effusions right greater than left with bilateral compressive atelectasis right worse than left. Postoperative change and known nodule right upper lobe with radiation fibrosis. Multiple abnormalities in the upper abdomen unchanged from prior study. Electronically Signed   By: RSkipper ClicheM.D.   On: 06/08/2015 17:02   [place]al  06/08/2015  CLINICAL DATA:  Acute renal injury EXAM: RENAL / URINARY TRACT ULTRASOUND COMPLETE COMPARISON:  None. FINDINGS: Right Kidney: Length: 10.6 cm. Echogenicity within normal limits. No hydronephrosis visualized. There are several cysts in the right kidney, largest in the lower pole right kidney measuring 2.6 x 1.9 x 2.5 cm. The midpole right kidney cyst is complex measuring 2.3 x 2.1 x 2.2 cm Left Kidney: Length: 10.6 cm. Echogenicity within normal limits. No hydronephrosis visualized. There  are small cysts within the left kidney, largest measures 1.4 x 1.4 x 1.6 cm in the lower pole. Bladder: The bladder is not visualized. Patient has a Foley catheter in place. There is a complex cyst in the spleen measuring 5 x 5.1 x 5.3 cm. There is a left pleural effusion. IMPRESSION:  Complex cyst in the midpole right kidney. Further evaluation with three-phase renal CT is recommended to exclude solid component. Simple cysts in both kidneys.  No hydronephrosis bilaterally. Complex cyst identified in the spleen. Left pleural effusion. Electronically Signed   By: Abelardo Diesel M.D.   On: 06/08/2015 16:56   Dg Chest Port 1 View  06/09/2015  CLINICAL DATA:  Status post central line placement. EXAM: PORTABLE CHEST 1 VIEW COMPARISON:  Same day. FINDINGS: Stable cardiomegaly. No pneumothorax is noted. Stable bilateral pleural effusions are noted with probable underlying atelectasis. Bony thorax is unremarkable. Right-sided PICC line has been repositioned, with distal tip in expected position of the SVC. IMPRESSION: Stable cardiomegaly and bilateral pleural effusions are noted with probable underlying atelectasis. Right-sided PICC line has been repositioned with distal tip in expected position of SVC. Electronically Signed   By: Marijo Conception, M.D.   On: 06/09/2015 16:25   Dg Chest Port 1 View  06/09/2015  CLINICAL DATA:  PICC line placement EXAM: PORTABLE CHEST 1 VIEW COMPARISON:  06/07/2015 FINDINGS: Right PICC line is in place. The tip is in the upper right atrium approximately 3 cm deep to the cavoatrial junction. Cardiomegaly. Bilateral perihilar and lower lobe opacities with layering effusions are similar to prior study. IMPRESSION: Right PICC line tip in the upper right atrium 3 cm deep to the cavoatrial junction. No change in the appearance of the chest otherwise. Electronically Signed   By: Rolm Baptise M.D.   On: 06/09/2015 16:20   Dg Chest Portable 1 View  06/07/2015  CLINICAL DATA:  Wheezing and cough, shortness of breath. History of COPD, CHF, hypertension, lung cancer in 2014. EXAM: PORTABLE CHEST 1 VIEW COMPARISON:  Chest x-ray dated 05/20/2015. FINDINGS: Cardiomediastinal silhouette appears stable in size and configuration. Cardiomegaly is grossly unchanged. Again noted is  central pulmonary vascular congestion and bibasilar airspace opacities. Overall appearance of the lungs is unchanged. Left-sided central line has been removed in the interval. Surgical changes again seen over the right upper lung. IMPRESSION: Overall stable appearance of the chest x-ray. Again noted is cardiomegaly with central pulmonary vascular congestion suggesting volume overload/CHF (persistent versus recurrent). Bibasilar opacities are also unchanged and probably represent a combination of atelectasis and bibasilar pulmonary edema with adjacent pleural effusions. As also stated on the previous report, bibasilar pneumonia cannot be confidently excluded. Electronically Signed   By: Franki Cabot M.D.   On: 06/07/2015 19:58   Dg Chest Port 1 View  05/17/2015  CLINICAL DATA:  Respiratory failure. EXAM: PORTABLE CHEST - 1 VIEW COMPARISON:  None. FINDINGS: Endotracheal tube, NG line, left IJ line in stable position. Cardiomegaly with pulmonary vascular prominence and bilateral interstitial prominence consistent with congestive heart failure. Interval partial clearing of pulmonary interstitial edema. Small bilateral pleural effusions. Surgical clips right upper abdomen. IMPRESSION: 1. Lines and tubes in stable position. 2. Congestive heart failure with partial clearing of bilateral pulmonary interstitial edema. Small bilateral pleural effusions Electronically Signed   By: [place]   On: 05/17/2015 08:54   Dg Chest Port 1 View  05/16/2015  CLINICAL DATA:  Post intubation.  Central line placement. EXAM: PORTABLE CHEST - 1 VIEW COMPARISON:  05/15/2015 FINDINGS: Endotracheal tube placed with tip measuring 3.6 cm above the carina. Enteric tube tip is off the field of view but below the left hemidiaphragm. Left central venous catheter tip is over the low SVC region. No pneumothorax. Normal heart size and pulmonary vascularity. Some prominence of the central pulmonary arteries. Diffuse infiltration in the  right lung increasing since previous study. This could be due to pneumonia or asymmetrical edema. Probable small bilateral pleural effusions and basilar atelectasis. Calcified and tortuous aorta. Surgical clips in the right upper chest and upper abdomen. IMPRESSION: Appliances appear in satisfactory position. Increasing infiltration in the right lung suggesting pneumonia or asymmetric edema. Small bilateral pleural effusions with basilar atelectasis. Electronically Signed   By: Lucienne Capers M.D.   On: 05/16/2015 21:48   Dg Chest Port 1 View  05/15/2015  CLINICAL DATA:  Fall 1 week ago, shortness of Breath EXAM: PORTABLE CHEST - 1 VIEW COMPARISON:  12/22/2014 FINDINGS: Cardiomediastinal silhouette is stable. Central mild vascular joint without pulmonary edema. Small bilateral pleural effusion with bilateral basilar atelectasis or infiltrate. No pneumothorax. IMPRESSION: Small bilateral pleural effusion with bilateral basilar atelectasis or infiltrate. No pulmonary edema. No pneumothorax. Electronically Signed   By: Lahoma Crocker M.D.   On: 05/15/2015 12:40    EKG: Atrial fibrillation with left bundle branch block  Weights: Filed Weights   06/07/15 1914  Weight: 162 lb (73.483 kg)     Physical Exam:tele showing atrial fibrillation with ventricular rate 90 bpm Blood pressure 145/51, pulse 95, temperature 98.2 F (36.8 C), temperature source Oral, resp. rate 26, height '5\' 5"'$  (1.651 m), weight 162 lb (73.483 kg), SpO2 95 %. Body mass index is 26.96 kg/(m^2). General: Well developed, well nourished, Short of breath with talking  Head: Normocephalic, atraumatic, sclera non-icteric, no xanthomas, nares are without discharge.  Neck: Negative for carotid bruits. JVD not elevated. Heart:  irregular RR with 2+ murmurs right sternal border   Lungs : Decreased breath sounds bilaterally throughout Abdomen: Soft, non-tender, non-distended with normoactive bowel sounds. No hepatomegaly. No rebound/guarding.  No obvious abdominal masses. Msk:  Strength and tone appear normal for age. Extremities: No clubbing or cyanosis. No edema.  Distal pedal pulses are 2+ and equal bilaterally. Neuro: Alert and oriented X 3. No facial asymmetry. No focal deficit. Moves all extremities spontaneously. Psych:  Responds to questions appropriately with a normal affect.    Assessment and Plan:  81 y.o. female with past medical history including coronary artery disease, paroxysmal atrial fibrillation, COPD, lung cancer, diabetes mellitus, and admitted to [facility] hospital on 12/22/2014 with chest pain,  elective cardiac catheterization found to have mild 50% disease in the Atrial fibrillationdiagonal branch of the LAD, but high-grade 90% hazy stenosis in the marginal branch of the circumflex,  successful PTCA and insertion of a 2.25x 8 millimeter Promus premier DES stent at the ostium of the OM3 vessel, started on aspirin and Plavix, hospital admission in August for electrolyte abnormality, digoxin toxicity, readmitted 06/07/2015 with shortness of breath, found to have atrial fibrillation , potassium of 7, renal failure .  Over the past 2 days, she has had a rapid decline in her blood count, currently receiving packed red blood cells this evening . Currently on digoxin for heart rate control for her atrial fibrillation .  she is on BiPAP at nighttime . Potassium level has improved down to 5.2, creatinine still elevated 1.7 with elevated BUN   drop in her blood count down to hemoglobin 6.7  1) atrial  fibrillation Heart rate well controlled on diltiazem infusion, 5 mg/hr Not a good candidate for anticoagulation as she is on aspirin and Plavix, now in the setting of significant anemia. Unable to restore normal sinus rhythm at this time even high risk of stroke. She has been in atrial fibrillation for greater than 48 hours. Not a good candidate for TEE as again we would need to start anticoagulation prior to and following  cardioversion. ---Would continue diltiazem as you are doing, could change to oral dosing if needed .  heart rate may improve after packed red blood cells , may need additional units   2) respiratory distress Likely multifactorial including profound anemia, atrial fibrillation, severe COPD, pleural effusions . --High risk for complication if she undergoes thoracentesis . --Limited in the ability to diurese given significant renal dysfunction . --Management of COPD per pulmonary /steroids/bronchodilators  3) pleural effusions  Long-standing pleural effusions seen on prior admissions. Previously small, now large Likely exacerbated by atrial fibrillation. On asa and plavix,  High risk for thoracentesis (would need to stop plavix for 5 days, high risk of stent thrombosis)  4) renal failure etiology of her renal failure is unclear Dramatically worse this admission, compared to her baseline Fluid status unclear, prior echo without pulmonary HTN Would hold off on diuresis given acute renal injury  5) Digoxin toxicity In setting of renal failure Level >2 Will d/c digoxin Will monitor for sx   Signed, Esmond Plants, MD [facility]  06/09/2015, 7:11 PM

## 2015-06-09 NOTE — Progress Notes (Signed)
   06/09/15 0900  Clinical Encounter Type  Visited With Patient and family together  Visit Type Initial  Consult/Referral To Chaplain  Spiritual Encounters  Spiritual Needs Prayer;Emotional  Stress Factors  Patient Stress Factors None identified  Family Stress Factors None identified  Chaplain rounded in the unit and offered a compassionate presence and support to patient and family. Met the family earlier in the week and offered assistance. Patient requested prayer and offered prayer to her and family. Chaplain Mehek Grega A. Rhapsody Wolven Ext. [phone]

## 2015-06-09 NOTE — Clinical Documentation Improvement (Signed)
Hospitalist  Can the diagnosis of CHF be further specified?    Acuity - Acute, Chronic, Acute on Chronic   Type - Systolic, Diastolic, Systolic and Diastolic  Other  Clinically Undetermined   Document any associated diagnoses/conditions   Supporting Information: Noted in medical record as past medical history of heart failure.  Xeay suggest volume overload/CHF; pulmonary edema, pleural effusion, and atelectasis.   Please exercise your independent, professional judgment when responding. A specific answer is not anticipated or expected.   Thank You,  [place] [phone]

## 2015-06-09 NOTE — Progress Notes (Signed)
PULMONARY/CCM NOTE  Date of admission: 06/07/15 Date of consult: 10/11 Reason for consultation: resp distress  Remains mildly dyspneic @ rest. Tolerated BiPAP well. Presently on [place] O2  Filed Vitals:   06/09/15 1100 06/09/15 1200 06/09/15 1223 06/09/15 1300  BP: 102/84  112/80 143/62  Pulse: 98 123  61  Temp:      TempSrc:      Resp: $Remo'28 24  22  'mcfaC$ Height:      Weight:      SpO2: 96% 96%  98%    EXAM:  Gen: mildly labored resp effort HEENT: NCAT, EOMI, PERRL Neck: No JVD Lungs: diminished BS throughout, no wheezes Cardiovascular: IRIR, rate controlled Abdomen: soft, NT, +BS Ext: no C/C/E Neuro: no focal deficits  DATA:  BMP Latest Ref Rng 06/09/2015 06/08/2015 06/08/2015  Glucose 65 - 99 mg/dL 92 134(H) 137(H)  BUN 6 - 20 mg/dL 63(H) 66(H) 70(H)  Creatinine 0.44 - 1.00 mg/dL 1.70(H) 1.69(H) 1.83(H)  Sodium 135 - 145 mmol/L 146(H) 140 141  Potassium 3.5 - 5.1 mmol/L 5.2(H) 5.5(H) 5.9(H)  Chloride 101 - 111 mmol/L 106 106 104  CO2 22 - 32 mmol/L 32 30 29  Calcium 8.9 - 10.3 mg/dL 9.1 8.5(L) 9.0    CBC Latest Ref Rng 06/09/2015 06/08/2015 06/07/2015  WBC 3.6 - 11.0 K/uL 3.6 6.0 6.4  Hemoglobin 12.0 - 16.0 g/dL 6.7(L) 7.5(L) 8.3(L)  Hematocrit 35.0 - 47.0 % 21.0(L) 23.0(L) 25.2(L)  Platelets 150 - 440 K/uL 199 199 217  PCT 0.17 ESR 107 mmHg  CXR: NNF  CT chest: Significant bilateral pleural effusions right greater than left with bilateral compressive atelectasis right worse than left. Postoperative change and known nodule right upper lobe with radiation fibrosis  IMPRESSION:   Acute on chronic respiratory failure Suspect COPD without overt bronchospasm Adenocarcinoma of lung, s/p XRT - minimal radiation fibrosis in area of tumor Bilateral effusions, moderate in size - likely this represents CHF/pulm edema   The effusions are large enough that they are probably contributing some to her dyspnea Anemia - PRBCs transfused 10/12   PLAN/REC:  Cont PRN BiPAP Cont  scheduled bronchodilators  I have requested thoracentesis to be performed by IR Mgmt of [facility] per Cards and primary team DC NS Consider diuresis as permitted by BP and renal function  Merton Border, MD PCCM service Mobile [phone] Pager [phone]

## 2015-06-09 NOTE — Progress Notes (Signed)
PROGRESS NOTE  Regina Burke EZM:[id number] DOB: December 06, 1932 DOA: 06/07/2015 PCP: Tracie Harrier, MD  Subjective 81 y/o f with hx of Chronic A-fib, Ca  Lung, Chronic  Hypoxemic and Hypercapnic Respiratory Failure, HTN, OSA, Type 2 DM admitted with  Hyperkalemia , acute Renal failure , A-fib with RVR This am appears  Weak. Short of breath with minimal exertion Renal function stable CT Chest Bilat Pleural effusions- Rt worse than left with compressive atelectasis and Rt upper lobe nodule with radiation fibrosis    Objective: BP 112/80 mmHg  Pulse 123  Temp(Src) 98 F (36.7 C) (Axillary)  Resp 24  Ht '5\' 5"'$  (1.651 m)  Wt 73.483 kg (162 lb)  BMI 26.96 kg/m2  SpO2 96%  Intake/Output Summary (Last 24 hours) at 06/09/15 1259 Last data filed at 06/09/15 1200  Gross per 24 hour  Intake 110.17 ml  Output   1775 ml  Net -1664.83 ml   Filed Weights   06/07/15 1914  Weight: 73.483 kg (162 lb)    Exam:  General: Pale looking. Dyspneic art rest  HEENT: PERRL; OP moist without lesions. Neck: supple, trachea midline, no thyromegaly Chest: normal to palpation Lungs: clear bilaterally without retractions or wheezes Cardiovascular:Irregular rate  Abdomen: soft, nontender, nondistended, positive bowel sounds Extremities: no clubbing, cyanosis, edema Neuro: alert and oriented, moves all extremities Derm: Pressure ulcer left butttock Lymph: no cervical or supraclavicular lymphadenopathy   Labs and imaging studies were reviewed*  Data Reviewed: Basic Metabolic Panel:  Recent Labs Lab 06/07/15 1939 06/08/15 0405 06/08/15 1003 06/08/15 1644 06/09/15 0418  NA 138 141 141 140 146*  K 6.2* 5.6* 5.9* 5.5* 5.2*  CL 100* 104 104 106 106  CO2 '28 29 29 30 '$ 32  GLUCOSE 159* 151* 137* 134* 92  BUN 80* 74* 70* 66* 63*  CREATININE 2.32* 1.95* 1.83* 1.69* 1.70*  CALCIUM 9.1 8.8* 9.0 8.5* 9.1   Liver Function Tests:  Recent Labs Lab 06/07/15 1939  AST 15  ALT 14  ALKPHOS 60   BILITOT 0.7  PROT 6.9  ALBUMIN 3.5   No results for input(s): LIPASE, AMYLASE in the last 168 hours. No results for input(s): AMMONIA in the last 168 hours. CBC:  Recent Labs Lab 06/07/15 1939 06/08/15 0405 06/09/15 0418  WBC 6.4 6.0 3.6  NEUTROABS 5.5  --  2.5  HGB 8.3* 7.5* 6.7*  HCT 25.2* 23.0* 21.0*  MCV 80.3 80.0 80.3  PLT 217 199 199   Cardiac Enzymes:    Recent Labs Lab 06/07/15 1939  TROPONINI 0.03   BNP (last 3 results)  Recent Labs  12/23/14 0434 05/15/15 1233 06/07/15 1939  BNP 213.8* 560.0* 941.0*    ProBNP (last 3 results) No results for input(s): PROBNP in the last 8760 hours.  CBG:  Recent Labs Lab 06/08/15 1126 06/08/15 1637 06/08/15 2052 06/09/15 0725 06/09/15 1134  GLUCAP 115* 137* 110* 87 137*    Recent Results (from the past 240 hour(s))  MRSA PCR Screening     Status: None   Collection Time: 06/08/15 12:30 AM  Result Value Ref Range Status   MRSA by PCR NEGATIVE NEGATIVE Final    Comment:        The GeneXpert MRSA Assay (FDA approved for NASAL specimens only), is one component of a comprehensive MRSA colonization surveillance program. It is not intended to diagnose MRSA infection nor to guide or monitor treatment for MRSA infections.      Studies: Ct Chest Wo Contrast  06/08/2015  CLINICAL  DATA:  Personal history of adenocarcinoma of lung it diagnosed April 2015 and treated with radiation therapy, hospitalized in September with respiratory failure, now with increased dyspnea and central chest pain EXAM: CT CHEST WITHOUT CONTRAST TECHNIQUE: Multidetector CT imaging of the chest was performed following the standard protocol without IV contrast. COMPARISON:  06/07/2015, 06/01/2014 FINDINGS: There is moderate left pleural effusion. The left upper lobe is clear except for mild apical scarring. There is anticipated compressive dependent atelectasis on the left. There is a moderate partially loculated pleural effusion on the  right. There is more extensive presumably compressive atelectasis at the right lung base as compared to the left. There is postsurgical change with evidence of radiation fibrosis medially in the right upper lobe. Beam attenuation artifact is present as the result of right apical clips, and obscures underlying parenchyma. There is a 7 x 64m central opacity that may represent a combination of fibrosis and residual tumor. Limited study without contrast shows no significant hilar or mediastinal adenopathy. There is cardiac enlargement. There is no significant pericardial effusion. There is coronary artery calcification as well as aortic calcification. No abnormalities involving the airway. Thoracic inlet and thyroid are normal. There are no acute musculoskeletal findings. Images to the upper abdomen partially demonstrate a known right renal cyst as well as a right adrenal mass. Large low-attenuation splenic lesion again identified. Hyper attenuating left renal lesion partially visualized and shows no change from prior study. IMPRESSION: Significant bilateral pleural effusions right greater than left with bilateral compressive atelectasis right worse than left. Postoperative change and known nodule right upper lobe with radiation fibrosis. Multiple abnormalities in the upper abdomen unchanged from prior study. Electronically Signed   By: RSkipper ClicheM.D.   On: 06/08/2015 17:02   [place]  06/08/2015  CLINICAL DATA:  Acute renal injury EXAM: RENAL / URINARY TRACT ULTRASOUND COMPLETE COMPARISON:  None. FINDINGS: Right Kidney: Length: 10.6 cm. Echogenicity within normal limits. No hydronephrosis visualized. There are several cysts in the right kidney, largest in the lower pole right kidney measuring 2.6 x 1.9 x 2.5 cm. The midpole right kidney cyst is complex measuring 2.3 x 2.1 x 2.2 cm Left Kidney: Length: 10.6 cm. Echogenicity within normal limits. No hydronephrosis visualized. There are small cysts within the  left kidney, largest measures 1.4 x 1.4 x 1.6 cm in the lower pole. Bladder: The bladder is not visualized. Patient has a Foley catheter in place. There is a complex cyst in the spleen measuring 5 x 5.1 x 5.3 cm. There is a left pleural effusion. IMPRESSION: Complex cyst in the midpole right kidney. Further evaluation with three-phase renal CT is recommended to exclude solid component. Simple cysts in both kidneys.  No hydronephrosis bilaterally. Complex cyst identified in the spleen. Left pleural effusion. Electronically Signed   By: WAbelardo DieselM.D.   On: 06/08/2015 16:56    Scheduled Meds: . sodium chloride   Intravenous Once  . antiseptic oral rinse  7 mL Mouth Rinse q12n4p  . aspirin EC  81 mg Oral Daily  . atorvastatin  10 mg Oral QHS  . budesonide  0.25 mg Nebulization 4 times per day  . chlorhexidine  15 mL Mouth Rinse BID  . cholecalciferol  400 Units Oral Daily  . clopidogrel  75 mg Oral Daily  . digoxin  0.125 mg Oral Daily  . diltiazem  180 mg Oral Daily  . diphenhydrAMINE  12.5 mg Intravenous Once  . famotidine  20 mg Oral Daily  .  febuxostat  40 mg Oral Daily  . ferrous sulfate  325 mg Oral Daily  . heparin  5,000 Units Subcutaneous 3 times per day  . insulin aspart  0-5 Units Subcutaneous QHS  . insulin aspart  0-9 Units Subcutaneous TID WC  . ipratropium-albuterol  3 mL Nebulization Q6H  . metoprolol succinate  100 mg Oral Daily  . multivitamin-lutein  1 capsule Oral Daily  . sertraline  25 mg Oral Daily  . sodium chloride  3 mL Intravenous Q12H  . vitamin B-12  500 mcg Oral Daily  . vitamin C  500 mg Oral Daily    Continuous Infusions: . sodium chloride 50 mL/hr at 06/09/15 1013  . diltiazem (CARDIZEM) infusion Stopped (06/08/15 1500)    Assessment/Plan:   1 Atrial fibrillation, with RVR:; On IV Cardizem, and Po dig   2 Acute kidney injury ([place]); Improving  3 Hyperkalemia;  Received Kayexalate- Se K is 5.2-Continue to monitor  4 Hypercapnic and Hypoxemic  respiratory failure- On BIPAP  5  Bilat pleural effusions -Rt worse than left - Unclear if due to CHF or malignancy  Consult Oncology  6 Pressure ulcer Left buttock; Watch for now   7 Anemia; Hgb ; 6.7- Transfuse 1 unit today  8 Type 2 DM; On SSI   Code Status: Full code  Family Communication: son and daughter        06/09/2015, 12:59 PM  LOS: 2 days

## 2015-06-09 NOTE — Consult Note (Signed)
[place] CONSULT NOTE  Patient Care Team: Tracie Harrier, MD as PCP - General (Internal Medicine)  CHIEF COMPLAINTS/PURPOSE OF CONSULTATION:  History of cancer/pleural effusions  HISTORY OF PRESENTING ILLNESS:  Regina Burke 81 y.o.  female with multiple medical problems including COPD/CHF A. Fib- prior history of lung cancer early stage [? Stage non-small cell] status post radiation. She is currently in the hospital for mental status changes/worsening respiratory status/A. fib with RVR; acute renal failure/hyperkalemia. Patient was initially intubated; she is currently extubated. She is currently on 3 L of nasal cannula. CT of the chest done for further evaluation showed- bilateral pleural effusions right [loculated] also left pleural effusion. Also shows a subcentimeter lesion in the right upper lung/associated fibrosis [? Primary radiation].   Medical oncology consultation for further questions regarding  worsening respiratory status context of previous lung cancer.  Patient's daughter by the bedside patient has had multiple admissions the hospital the last recent months. She has been progressively getting weaker at discharge.   ROS: Patient is a vague historian. Patient definitely complains of orthopnea prior to admission. She stated that her weight fluctuates a lot. Her appetite is fair. Denies any unusual headaches. Does admit to gait instability and falls. A complete 10 point review of system is done which is negative except mentioned above in history of present illness  MEDICAL HISTORY:  Past Medical History  Diagnosis Date  . COPD (chronic obstructive pulmonary disease) ([place])   . CHF (congestive heart failure) ([place])   . Hypertension   . Rapid palpitations 2014    Seen at [facility], may have been atrial fibrillation  . DOE (dyspnea on exertion)     Started after treatment for lung cancer  . High cholesterol   . Heart murmur   . Myocardial infarction ([place])  12/22/2014   . Lung cancer ([place]) dx'd 2014    S/P radiation 2015  . Sleep apnea   . Cushing's disease ([place])   . Type II diabetes mellitus ([place])   . Anemia   . GERD (gastroesophageal reflux disease)   . History of hiatal hernia   . Migraine   . Arthritis   . Gout   . Depression   . Chronic kidney disease (CKD), stage III (moderate)     SURGICAL HISTORY: Past Surgical History  Procedure Laterality Date  . Adrenalectomy Left 1980's    "[place]"  . Abdominal hysterectomy    . Appendectomy    . Cholecystectomy    . Coronary angioplasty with stent placement  12/23/2014  . Tonsillectomy    . Fracture surgery    . Wrist fracture surgery Bilateral ~ 2000  . Breast cyst excision Left   . Tubal ligation    . Left heart catheterization with coronary angiogram N/A 12/23/2014    Procedure: LEFT HEART CATHETERIZATION WITH CORONARY ANGIOGRAM;  Surgeon: Burnell Blanks, MD;  Location: [facility] CATH LAB;  Service: Cardiovascular;  Laterality: N/A;  . Percutaneous coronary stent intervention (pci-s)  12/23/2014    Procedure: PERCUTANEOUS CORONARY STENT INTERVENTION (PCI-S);  Surgeon: Burnell Blanks, MD;  Location: [facility] CATH LAB;  Service: Cardiovascular;;  Promus 2.25x8    SOCIAL HISTORY: Social History   Social History  . Marital Status: Widowed    Spouse Name: N/A  . Number of Children: N/A  . Years of Education: N/A   Occupational History  . Not on file.   Social History Main Topics  . Smoking status: Former Smoker -- 1.00 packs/day for 45  years    Types: Cigarettes    Quit date: 04/25/1998  . Smokeless tobacco: Never Used  . Alcohol Use: Yes     Comment: 12/23/2014 "might have a couple mixed drinks/year"  . Drug Use: No  . Sexual Activity: No   Other Topics Concern  . Not on file   Social History Narrative    FAMILY HISTORY: Family History  Problem Relation Age of Onset  . Heart disease Mother   . Diabetes Mother   . Osteoarthritis Mother   . Hypertension  Mother   . Heart disease Father   . Hypertension Father   . COPD Brother     ALLERGIES:  is allergic to ciprofloxacin; doxycycline; penicillins; sulfa antibiotics; and morphine and related.  MEDICATIONS:  Current Facility-Administered Medications  Medication Dose Route Frequency Provider Last Rate Last Dose  . 0.9 %  sodium chloride infusion   Intravenous Continuous Wilhelmina Mcardle, MD 10 mL/hr at 06/09/15 1503    . 0.9 %  sodium chloride infusion   Intravenous Once Vishwanath Hande, MD      . acetaminophen (TYLENOL) tablet 650 mg  650 mg Oral Q6H PRN Lytle Butte, MD       Or  . acetaminophen (TYLENOL) suppository 650 mg  650 mg Rectal Q6H PRN Lytle Butte, MD      . ALPRAZolam Duanne Moron) tablet 0.25 mg  0.25 mg Oral TID PRN Lytle Butte, MD   0.25 mg at 06/09/15 1223  . antiseptic oral rinse (CPC / CETYLPYRIDINIUM CHLORIDE 0.05%) solution 7 mL  7 mL Mouth Rinse q12n4p Vishwanath Hande, MD   7 mL at 06/09/15 1226  . aspirin EC tablet 81 mg  81 mg Oral Daily Lytle Butte, MD   81 mg at 06/09/15 0959  . atorvastatin (LIPITOR) tablet 10 mg  10 mg Oral QHS Lytle Butte, MD   10 mg at 06/08/15 0021  . budesonide (PULMICORT) nebulizer solution 0.25 mg  0.25 mg Nebulization 4 times per day Wilhelmina Mcardle, MD   0.25 mg at 06/09/15 1504  . chlorhexidine (PERIDEX) 0.12 % solution 15 mL  15 mL Mouth Rinse BID Vishwanath Hande, MD   15 mL at 06/09/15 1011  . cholecalciferol (VITAMIN D) tablet 400 Units  400 Units Oral Daily Lytle Butte, MD   400 Units at 06/09/15 1001  . clopidogrel (PLAVIX) tablet 75 mg  75 mg Oral Daily Lytle Butte, MD   75 mg at 06/09/15 0959  . digoxin (LANOXIN) tablet 0.125 mg  0.125 mg Oral Daily Lytle Butte, MD   0.125 mg at 06/09/15 1002  . diltiazem (CARDIZEM) 100 mg in dextrose 5 % 100 mL (1 mg/mL) infusion  5-15 mg/hr Intravenous Titrated Lytle Butte, MD 10 mL/hr at 06/09/15 1343 10 mg/hr at 06/09/15 1343  . diltiazem (DILACOR XR) 24 hr capsule 180 mg  180 mg  Oral Daily Lytle Butte, MD   180 mg at 06/09/15 1000  . diphenhydrAMINE (BENADRYL) injection 12.5 mg  12.5 mg Intravenous Once Harrie Foreman, MD   12.5 mg at 06/08/15 0754  . docusate sodium (COLACE) capsule 100 mg  100 mg Oral BID PRN Lytle Butte, MD      . famotidine (PEPCID) tablet 20 mg  20 mg Oral Daily Lytle Butte, MD   20 mg at 06/09/15 1008  . febuxostat (ULORIC) tablet 40 mg  40 mg Oral Daily Lytle Butte, MD   40  mg at 06/09/15 1004  . ferrous sulfate tablet 325 mg  325 mg Oral Daily Lytle Butte, MD   325 mg at 06/09/15 1004  . heparin injection 5,000 Units  5,000 Units Subcutaneous 3 times per day Lytle Butte, MD   5,000 Units at 06/09/15 1420  . insulin aspart (novoLOG) injection 0-5 Units  0-5 Units Subcutaneous QHS Lytle Butte, MD   0 Units at 06/08/15 0004  . insulin aspart (novoLOG) injection 0-9 Units  0-9 Units Subcutaneous TID WC Lytle Butte, MD   1 Units at 06/09/15 1223  . ipratropium-albuterol (DUONEB) 0.5-2.5 (3) MG/3ML nebulizer solution 3 mL  3 mL Nebulization Q6H Wilhelmina Mcardle, MD   3 mL at 06/09/15 1503  . metoprolol succinate (TOPROL-XL) 24 hr tablet 100 mg  100 mg Oral Daily Lytle Butte, MD   100 mg at 06/09/15 1000  . multivitamin-lutein (OCUVITE-LUTEIN) capsule 1 capsule  1 capsule Oral Daily Lytle Butte, MD   1 capsule at 06/09/15 1001  . nitroGLYCERIN (NITROSTAT) SL tablet 0.4 mg  0.4 mg Sublingual Q5 min PRN Lytle Butte, MD      . ondansetron [facility]) tablet 4 mg  4 mg Oral Q6H PRN Lytle Butte, MD       Or  . ondansetron [facility]) injection 4 mg  4 mg Intravenous Q6H PRN Lytle Butte, MD      . senna [facility]) tablet 8.6 mg  1 tablet Oral Daily PRN Lytle Butte, MD   8.6 mg at 06/08/15 1123  . sertraline (ZOLOFT) tablet 25 mg  25 mg Oral Daily Lytle Butte, MD   25 mg at 06/09/15 1000  . sodium chloride 0.9 % injection 3 mL  3 mL Intravenous Q12H Lytle Butte, MD   3 mL at 06/09/15 1013  . vitamin B-12 (CYANOCOBALAMIN) tablet 500  mcg  500 mcg Oral Daily Lytle Butte, MD   500 mcg at 06/09/15 1005  . vitamin C (ASCORBIC ACID) tablet 500 mg  500 mg Oral Daily Lytle Butte, MD   500 mg at 06/09/15 1032      .  PHYSICAL EXAMINATION: ECOG PERFORMANCE STATUS: 3 - Symptomatic, >50% confined to bed  Filed Vitals:   06/09/15 1600  BP: 116/52  Pulse: 86  Temp:   Resp: 22   Filed Weights   06/07/15 1914  Weight: 162 lb (73.483 kg)    GENERAL: Well-nourished well-developed; Alert, no acute distress she is accompanied by daughter. She is on nasal cannula 3 L.  EYES: Positive for pallor. OROPHARYNX: Poor dentition. NECK: supple, no masses felt LYMPH:  no palpable lymphadenopathy in the cervical, axillary or inguinal regions LUNGS: Decreased breath sounds bilaterally. HEART/CVS: regular rate & rhythm and no murmurs; 1+ edema bilaterally. ABDOMEN: abdomen soft, non-tender and normal bowel sounds Musculoskeletal:no cyanosis of digits and no clubbing  PSYCH: alert & oriented x 3 with fluent speech NEURO: no focal motor/sensory deficits SKIN:  no rashes or significant lesions  LABORATORY DATA:  I have reviewed the data as listed Lab Results  Component Value Date   WBC 3.6 06/09/2015   HGB 6.7* 06/09/2015   HCT 21.0* 06/09/2015   MCV 80.3 06/09/2015   PLT 199 06/09/2015    Recent Labs  12/24/14 0342  05/16/15 2155  05/17/15 0814  06/07/15 1939  06/08/15 1003 06/08/15 1644 06/09/15 0418  NA 137  < > 133*  < > 137  < > 138  < >  141 140 146*  K 4.7  < > 7.0*  < > 5.0  < > 6.2*  < > 5.9* 5.5* 5.2*  CL 100  < > 98*  < > 100*  < > 100*  < > 104 106 106  CO2 28  < > 27  < > 27  < > 28  < > 29 30 32  GLUCOSE 135*  < > 344*  < > 204*  < > 159*  < > 137* 134* 92  BUN 21  < > 61*  < > 71*  < > 80*  < > 70* 66* 63*  CREATININE 1.48*  < > 1.83*  < > 1.87*  < > 2.32*  < > 1.83* 1.69* 1.70*  CALCIUM 8.6  < > 8.4*  < > 9.1  < > 9.1  < > 9.0 8.5* 9.1  GFRNONAA 32*  < > 25*  < > 24*  < > 19*  < > 25* 28* 27*   GFRAA 38*  < > 29*  < > 28*  < > 22*  < > 29* 32* 32*  PROT 5.8*  < > 6.5  6.4*  --  6.0*  --  6.9  --   --   --   --   ALBUMIN 3.2*  < > 3.6  3.6  --  3.5  --  3.5  --   --   --   --   AST 25  < > 30  30  --  29  --  15  --   --   --   --   ALT 13  < > 16  15  --  15  --  14  --   --   --   --   ALKPHOS 38*  < > 41  41  --  39  --  60  --   --   --   --   BILITOT 0.5  < > 0.7  0.8  --  0.7  --  0.7  --   --   --   --   BILIDIR 0.1  --  <0.1*  --   --   --  0.2  --   --   --   --   IBILI 0.4  --  NOT CALCULATED  --   --   --  0.5  --   --   --   --   < > = values in this interval not displayed.  RADIOGRAPHIC STUDIES: I have personally reviewed the radiological images as listed and agreed with the findings in the report. Dg Chest 1 View  05/20/2015  CLINICAL DATA:  Shortness of breath. EXAM: CHEST  1 VIEW COMPARISON:  05/17/2015. FINDINGS: Left IJ line in good anatomic position. Mediastinum hilar structures normal. Cardiomegaly with bilateral pulmonary alveolar infiltrates and pleural effusions consistent congestive heart failure. Bilateral pneumonia cannot be excluded. No pneumothorax. Surgical clips right upper chest. IMPRESSION: 1. Lines and tubes in stable position. 2. Cardiomegaly with bilateral basilar pulmonary alveolar infiltrates and pleural effusions consistent with congestive heart failure. Bibasilar pneumonia cannot be excluded. Electronically Signed   By: Marcello Moores  Register   On: 05/20/2015 07:15   Dg Abd 1 View  05/16/2015  CLINICAL DATA:  Encounter for orogastric tube placement. EXAM: ABDOMEN - 1 VIEW COMPARISON:  None. FINDINGS: Tip of the orogastric tube extends well below the left hemidiaphragm consistent with it being in the mid  stomach. There are surgical vascular clips in left upper quadrant. Normal bowel gas pattern. IMPRESSION: Orogastric tube tip projects within the mid stomach. Electronically Signed   By: Lajean Manes M.D.   On: 05/16/2015 21:52   Ct Chest Wo  Contrast  06/08/2015  CLINICAL DATA:  Personal history of adenocarcinoma of lung it diagnosed April 2015 and treated with radiation therapy, hospitalized in September with respiratory failure, now with increased dyspnea and central chest pain EXAM: CT CHEST WITHOUT CONTRAST TECHNIQUE: Multidetector CT imaging of the chest was performed following the standard protocol without IV contrast. COMPARISON:  06/07/2015, 06/01/2014 FINDINGS: There is moderate left pleural effusion. The left upper lobe is clear except for mild apical scarring. There is anticipated compressive dependent atelectasis on the left. There is a moderate partially loculated pleural effusion on the right. There is more extensive presumably compressive atelectasis at the right lung base as compared to the left. There is postsurgical change with evidence of radiation fibrosis medially in the right upper lobe. Beam attenuation artifact is present as the result of right apical clips, and obscures underlying parenchyma. There is a 7 x 4m central opacity that may represent a combination of fibrosis and residual tumor. Limited study without contrast shows no significant hilar or mediastinal adenopathy. There is cardiac enlargement. There is no significant pericardial effusion. There is coronary artery calcification as well as aortic calcification. No abnormalities involving the airway. Thoracic inlet and thyroid are normal. There are no acute musculoskeletal findings. Images to the upper abdomen partially demonstrate a known right renal cyst as well as a right adrenal mass. Large low-attenuation splenic lesion again identified. Hyper attenuating left renal lesion partially visualized and shows no change from prior study. IMPRESSION: Significant bilateral pleural effusions right greater than left with bilateral compressive atelectasis right worse than left. Postoperative change and known nodule right upper lobe with radiation fibrosis. Multiple  abnormalities in the upper abdomen unchanged from prior study. Electronically Signed   By: RSkipper ClicheM.D.   On: 06/08/2015 17:02   [place]  06/08/2015  CLINICAL DATA:  Acute renal injury EXAM: RENAL / URINARY TRACT ULTRASOUND COMPLETE COMPARISON:  None. FINDINGS: Right Kidney: Length: 10.6 cm. Echogenicity within normal limits. No hydronephrosis visualized. There are several cysts in the right kidney, largest in the lower pole right kidney measuring 2.6 x 1.9 x 2.5 cm. The midpole right kidney cyst is complex measuring 2.3 x 2.1 x 2.2 cm Left Kidney: Length: 10.6 cm. Echogenicity within normal limits. No hydronephrosis visualized. There are small cysts within the left kidney, largest measures 1.4 x 1.4 x 1.6 cm in the lower pole. Bladder: The bladder is not visualized. Patient has a Foley catheter in place. There is a complex cyst in the spleen measuring 5 x 5.1 x 5.3 cm. There is a left pleural effusion. IMPRESSION: Complex cyst in the midpole right kidney. Further evaluation with three-phase renal CT is recommended to exclude solid component. Simple cysts in both kidneys.  No hydronephrosis bilaterally. Complex cyst identified in the spleen. Left pleural effusion. Electronically Signed   By: WAbelardo DieselM.D.   On: 06/08/2015 16:56   Dg Chest Port 1 View  06/09/2015  CLINICAL DATA:  Status post central line placement. EXAM: PORTABLE CHEST 1 VIEW COMPARISON:  Same day. FINDINGS: Stable cardiomegaly. No pneumothorax is noted. Stable bilateral pleural effusions are noted with probable underlying atelectasis. Bony thorax is unremarkable. Right-sided PICC line has been repositioned, with distal tip in expected position of the  SVC. IMPRESSION: Stable cardiomegaly and bilateral pleural effusions are noted with probable underlying atelectasis. Right-sided PICC line has been repositioned with distal tip in expected position of SVC. Electronically Signed   By: Marijo Conception, M.D.   On: 06/09/2015 16:25    Dg Chest Port 1 View  06/09/2015  CLINICAL DATA:  PICC line placement EXAM: PORTABLE CHEST 1 VIEW COMPARISON:  06/07/2015 FINDINGS: Right PICC line is in place. The tip is in the upper right atrium approximately 3 cm deep to the cavoatrial junction. Cardiomegaly. Bilateral perihilar and lower lobe opacities with layering effusions are similar to prior study. IMPRESSION: Right PICC line tip in the upper right atrium 3 cm deep to the cavoatrial junction. No change in the appearance of the chest otherwise. Electronically Signed   By: Rolm Baptise M.D.   On: 06/09/2015 16:20   Dg Chest Portable 1 View  06/07/2015  CLINICAL DATA:  Wheezing and cough, shortness of breath. History of COPD, CHF, hypertension, lung cancer in 2014. EXAM: PORTABLE CHEST 1 VIEW COMPARISON:  Chest x-ray dated 05/20/2015. FINDINGS: Cardiomediastinal silhouette appears stable in size and configuration. Cardiomegaly is grossly unchanged. Again noted is central pulmonary vascular congestion and bibasilar airspace opacities. Overall appearance of the lungs is unchanged. Left-sided central line has been removed in the interval. Surgical changes again seen over the right upper lung. IMPRESSION: Overall stable appearance of the chest x-ray. Again noted is cardiomegaly with central pulmonary vascular congestion suggesting volume overload/CHF (persistent versus recurrent). Bibasilar opacities are also unchanged and probably represent a combination of atelectasis and bibasilar pulmonary edema with adjacent pleural effusions. As also stated on the previous report, bibasilar pneumonia cannot be confidently excluded. Electronically Signed   By: Franki Cabot M.D.   On: 06/07/2015 19:58   Dg Chest Port 1 View  05/17/2015  CLINICAL DATA:  Respiratory failure. EXAM: PORTABLE CHEST - 1 VIEW COMPARISON:  None. FINDINGS: Endotracheal tube, NG line, left IJ line in stable position. Cardiomegaly with pulmonary vascular prominence and bilateral  interstitial prominence consistent with congestive heart failure. Interval partial clearing of pulmonary interstitial edema. Small bilateral pleural effusions. Surgical clips right upper abdomen. IMPRESSION: 1. Lines and tubes in stable position. 2. Congestive heart failure with partial clearing of bilateral pulmonary interstitial edema. Small bilateral pleural effusions Electronically Signed   By: [place]   On: 05/17/2015 08:54   Dg Chest Port 1 View  05/16/2015  CLINICAL DATA:  Post intubation.  Central line placement. EXAM: PORTABLE CHEST - 1 VIEW COMPARISON:  05/15/2015 FINDINGS: Endotracheal tube placed with tip measuring 3.6 cm above the carina. Enteric tube tip is off the field of view but below the left hemidiaphragm. Left central venous catheter tip is over the low SVC region. No pneumothorax. Normal heart size and pulmonary vascularity. Some prominence of the central pulmonary arteries. Diffuse infiltration in the right lung increasing since previous study. This could be due to pneumonia or asymmetrical edema. Probable small bilateral pleural effusions and basilar atelectasis. Calcified and tortuous aorta. Surgical clips in the right upper chest and upper abdomen. IMPRESSION: Appliances appear in satisfactory position. Increasing infiltration in the right lung suggesting pneumonia or asymmetric edema. Small bilateral pleural effusions with basilar atelectasis. Electronically Signed   By: Lucienne Capers M.D.   On: 05/16/2015 21:48   Dg Chest Port 1 View  05/15/2015  CLINICAL DATA:  Fall 1 week ago, shortness of Breath EXAM: PORTABLE CHEST - 1 VIEW COMPARISON:  12/22/2014 FINDINGS: Cardiomediastinal silhouette is stable.  Central mild vascular joint without pulmonary edema. Small bilateral pleural effusion with bilateral basilar atelectasis or infiltrate. No pneumothorax. IMPRESSION: Small bilateral pleural effusion with bilateral basilar atelectasis or infiltrate. No pulmonary edema. No  pneumothorax. Electronically Signed   By: Lahoma Crocker M.D.   On: 05/15/2015 12:40    ASSESSMENT & PLAN:   81 year old female patient with history of early stage lung cancer [adeno ca with lepidic pattern] status post radiation [2015] currently admitted to the hospital for worsening respiratory status/A. fib with RVR.  # Bilateral pleural effusions right more than left. Etiology is unclear. However, no obvious lung cancer recurrence noted on the scans. I clinically suspect that patients pleural effusion are more likely from her underlying heart issues; rather than her lung cancer itself. However the only way to confirm recurrence of malignancy- is to offer thoracentesis with evaluation for cytology. I would defer to pulmonary/cardiology for the management of pleural effusions.   # Severe anemia- hemoglobin 6.7; the etiology is unclear no obvious GI bleed noted. Could be from the critical illness/chronic kidney disease. I agree with blood transfusions. Recommend checking LDH; iron studies; ferritin; monoclonal workup; B12 to rule out other causes of anemia.  The above plan of care was discussed with the patient and her daughters in detail.   Thank you Dr.Hande for allowing me to participate in the care of your pleasant patient. Please do not hesitate to contact me if any questions or concerns in the interim.    I spent 30 minutes counseling the patient face to face. The total time spent in the appointment was 50  minutes and more than 50% was on counseling.     Cammie Sickle, MD 06/09/2015 5:14 PM

## 2015-06-10 ENCOUNTER — Ambulatory Visit: Admission: RE | Admit: 2015-06-10 | Payer: Medicare Other | Source: Ambulatory Visit

## 2015-06-10 ENCOUNTER — Inpatient Hospital Stay: Payer: Medicare Other

## 2015-06-10 DIAGNOSIS — J9602 Acute respiratory failure with hypercapnia: Secondary | ICD-10-CM

## 2015-06-10 LAB — BASIC METABOLIC PANEL
Anion gap: 7 (ref 5–15)
BUN: 46 mg/dL — ABNORMAL HIGH (ref 6–20)
CALCIUM: 8.8 mg/dL — AB (ref 8.9–10.3)
CO2: 32 mmol/L (ref 22–32)
CREATININE: 1.2 mg/dL — AB (ref 0.44–1.00)
Chloride: 103 mmol/L (ref 101–111)
GFR calc non Af Amer: 42 mL/min — ABNORMAL LOW (ref >60)
GFR, EST AFRICAN AMERICAN: 48 mL/min — AB (ref >60)
GLUCOSE: 121 mg/dL — AB (ref 65–99)
Potassium: 4.1 mmol/L (ref 3.5–5.1)
Sodium: 142 mmol/L (ref 135–145)

## 2015-06-10 LAB — CBC WITH DIFFERENTIAL/PLATELET
BASOS PCT: 1 %
Basophils Absolute: 0 10*3/uL (ref 0–0.1)
EOS ABS: 0.2 10*3/uL (ref 0–0.7)
Eosinophils Relative: 4 %
HCT: 24.6 % — ABNORMAL LOW (ref 35.0–47.0)
Hemoglobin: 8 g/dL — ABNORMAL LOW (ref 12.0–16.0)
Lymphocytes Relative: 16 %
Lymphs Abs: 0.6 10*3/uL — ABNORMAL LOW (ref 1.0–3.6)
MCH: 26.4 pg (ref 26.0–34.0)
MCHC: 32.6 g/dL (ref 32.0–36.0)
MCV: 80.9 fL (ref 80.0–100.0)
MONO ABS: 0.3 10*3/uL (ref 0.2–0.9)
MONOS PCT: 8 %
Neutro Abs: 2.8 10*3/uL (ref 1.4–6.5)
Neutrophils Relative %: 71 %
Platelets: 219 10*3/uL (ref 150–440)
RBC: 3.04 MIL/uL — ABNORMAL LOW (ref 3.80–5.20)
RDW: 16.1 % — AB (ref 11.5–14.5)
WBC: 3.9 10*3/uL (ref 3.6–11.0)

## 2015-06-10 LAB — GLUCOSE, CAPILLARY
GLUCOSE-CAPILLARY: 144 mg/dL — AB (ref 65–99)
GLUCOSE-CAPILLARY: 140 mg/dL — AB (ref 65–99)
Glucose-Capillary: 108 mg/dL — ABNORMAL HIGH (ref 65–99)
Glucose-Capillary: 109 mg/dL — ABNORMAL HIGH (ref 65–99)

## 2015-06-10 LAB — BODY FLUID CELL COUNT WITH DIFFERENTIAL
EOS FL: 0 %
Lymphs, Fluid: 24 %
Monocyte-Macrophage-Serous Fluid: 5 %
Neutrophil Count, Fluid: 71 %
Other Cells, Fluid: 0 %
Total Nucleated Cell Count, Fluid: 4737 cu mm

## 2015-06-10 LAB — GLUCOSE, SEROUS FLUID: GLUCOSE FL: 125 mg/dL

## 2015-06-10 LAB — TYPE AND SCREEN
ABO/RH(D): A POS
ANTIBODY SCREEN: NEGATIVE
Unit division: 0

## 2015-06-10 LAB — LACTATE DEHYDROGENASE, PLEURAL OR PERITONEAL FLUID: LD FL: 150 U/L — AB (ref 3–23)

## 2015-06-10 LAB — LACTATE DEHYDROGENASE: LDH: 77 U/L — AB (ref 98–192)

## 2015-06-10 LAB — VITAMIN B12: Vitamin B-12: 1623 pg/mL — ABNORMAL HIGH (ref 180–914)

## 2015-06-10 LAB — IRON AND TIBC
IRON: 34 ug/dL (ref 28–170)
Saturation Ratios: 10 % — ABNORMAL LOW (ref 10.4–31.8)
TIBC: 340 ug/dL (ref 250–450)
UIBC: 306 ug/dL

## 2015-06-10 LAB — PROTEIN, BODY FLUID: Total protein, fluid: 3.5 g/dL

## 2015-06-10 LAB — FERRITIN: Ferritin: 69 ng/mL (ref 11–307)

## 2015-06-10 MED ORDER — SODIUM CHLORIDE 0.9 % IV BOLUS (SEPSIS): 1000.0000 mL | Freq: Once | INTRAVENOUS | Status: DC

## 2015-06-10 MED ORDER — DILTIAZEM HCL ER 240 MG PO CP24
240.0000 mg | ORAL_CAPSULE | Freq: Every day | ORAL | Status: DC
  Administered 2015-06-11: 240 mg via ORAL
  Filled 2015-06-10: qty 1

## 2015-06-10 MED ORDER — DILTIAZEM HCL 30 MG PO TABS
30.0000 mg | ORAL_TABLET | Freq: Four times a day (QID) | ORAL | Status: DC
  Administered 2015-06-11 (×2): 30 mg via ORAL
  Filled 2015-06-10 (×2): qty 1

## 2015-06-10 NOTE — Progress Notes (Signed)
Palliative Care Update  Palliative Care Consult initiated.  More to follow including full note.  Colleen Can, MD

## 2015-06-10 NOTE — Progress Notes (Signed)
Patient alert and oriented. Vitals stable- controlled afib on monitor. No complaints of pain- on 3 liters of oygen- Pt continues to be on 5 of cardizem drip- Dr. Rockey Situ notified-ordered to keep patient on drip until further orders from him.  Thoracocentesis  performed 1.1 liters pulled off.  Foley in place- urine output adequate.  Family at bedside.

## 2015-06-10 NOTE — Clinical Social Work Note (Signed)
Clinical Social Work Assessment  Patient Details  Name: Regina Burke MRN: [id number] Date of Birth: August 25, 1933  Date of referral:  06/10/15               Reason for consult:  Facility Placement                Permission sought to share information with:  Family Supports Permission granted to share information::  Yes, Verbal Permission Granted  Name::        Agency::     Relationship::     Contact Information:     Housing/Transportation Living arrangements for the past 2 months:  [place] of Information:  Patient, Adult Children Patient Interpreter Needed:  None Criminal Activity/Legal Involvement Pertinent to Current Situation/Hospitalization:  No - Comment as needed Significant Relationships:  Adult Children Lives with:  Facility Resident Do you feel safe going back to the place where you live?  Yes Need for family participation in patient care:  Yes (Comment)  Care giving concerns:  Patient lives at home with her daughter who works but recently has been at [facility] for rehab.    Social Worker assessment / plan:  CSW met with patient and her son, [name], this morning in her hospital room. Patient informed CSW that she was not happy at [facility] and did not like her roommate, requested a private room and did not get one after she was there, and did not think she was being cared for well. Patient and her son both stated that they did not want to return to [facility]. Patient was adamant she was going to return home. CSW presented concerns with this as there was concern she was not able to get up on her own currently and ambulate. CSW asked if she had someone in the family who could help with this 24/7. Patient's son [name] stated that he and patient would talk about the options further and then follow up with CSW. Patient is aware that there are limiting facilities in the area that will accommodate someone on a bipap machine which she has been using at  night.  RN CM notified of the potential for patient to need to have arrangements made for home.   Employment status:  Retired Nurse, adult PT Recommendations:  Not assessed at this time Information / Referral to community resources:     Patient/Family's Response to care:  Patient very pleasant and appreciative of CSW visit.  Patient/Family's Understanding of and Emotional Response to Diagnosis, Current Treatment, and Prognosis:  Patient is willing to discuss with her family the options presented to her today and to really think about her limitations.  Emotional Assessment Appearance:  Appears stated age Attitude/Demeanor/Rapport:   (pleasant and cooperative) Affect (typically observed):  Accepting, Appropriate, Pleasant Orientation:  Oriented to Self, Oriented to Place, Oriented to  Time, Oriented to Situation Alcohol / Substance use:  Not Applicable Psych involvement (Current and /or in the community):  No (Comment)  Discharge Needs  Concerns to be addressed:  Care Coordination Readmission within the last 30 days:  No Current discharge risk:  None Barriers to Discharge:  No Barriers Identified   Shela Leff, LCSW 06/10/2015, 11:46 AM

## 2015-06-10 NOTE — Procedures (Signed)
[place] right thoracentesis  Complications:  None  Blood Loss: none  See dictation in canopy pacs

## 2015-06-10 NOTE — Care Management Note (Addendum)
Case Management Note  Patient Details  Name: Regina Burke MRN: [id number] Date of Birth: [birth year]  Subjective/Objective:  At [facility] 09/17-09/26 for acute respiratory failure, discharged to [facility]. Readmitted 10/10 with acute respiratory failure, acute kidney injury, hyperkalemia, A-fib with RVR on Cardizem gtt. COPD and bilateral effusions. Plan is thoracentesis today.  Will follow progress. CSW following.                Action/Plan: [facility] at discharge.   Expected Discharge Date:                  Expected Discharge Plan:  Skilled Nursing Facility  In-House Referral:  Clinical Social Work  Discharge planning Services  CM Consult  Post Acute Care Choice:    Choice offered to:     DME Arranged:    DME Agency:     HH Arranged:    [place] Agency:     Status of Service:  In process, will continue to follow  Medicare Important Message Given:    Date Medicare IM Given:    Medicare IM give by:    Date Additional Medicare IM Given:    Additional Medicare Important Message give by:     If discussed at [place] of Stay Meetings, dates discussed:    Additional Comments:  Jolly Mango, RN 06/10/2015, 9:08 AM

## 2015-06-10 NOTE — Progress Notes (Signed)
Patient: Regina Burke / Admit Date: 06/07/2015 / Date of Encounter: 06/10/2015, 8:50 AM   Subjective: Feels better today, improving SOB after blood Eating well. Legs weak. Slept well.   Review of Systems: ROS Constitutional: Positive for malaise/fatigue.  Respiratory: Positive for shortness of breath.  Cardiovascular: Negative.  Gastrointestinal: Negative.  Musculoskeletal: Negative.  Neurological: Positive for weakness.  Psychiatric/Behavioral: Positive for depression.  All other systems reviewed and are negative.  Objective: Telemetry: tele with rate 90 to 100 Physical Exam: Blood pressure 127/52, pulse 87, temperature 97.5 F (36.4 C), temperature source Oral, resp. rate 28, height '5\' 5"'$  (1.651 m), weight 162 lb (73.483 kg), SpO2 99 %. Body mass index is 26.96 kg/(m^2). General: Well developed, well nourished, Short of breath with talking  Head: Normocephalic, atraumatic, sclera non-icteric, no xanthomas, nares are without discharge. Neck: Negative for carotid bruits. JVD not elevated. Heart: irregular RR with 2+ murmurs right sternal border  Lungs : Decreased breath sounds bilaterally throughout Abdomen: Soft, non-tender, non-distended with normoactive bowel sounds. No hepatomegaly. No rebound/guarding. No obvious abdominal masses. Msk: Strength and tone appear normal for age. Extremities: No clubbing or cyanosis. No edema. Distal pedal pulses are 2+ and equal bilaterally. Neuro: Alert and oriented X 3. No facial asymmetry. No focal deficit. Moves all extremities spontaneously. Psych: Responds to questions appropriately with a normal affect.   Intake/Output Summary (Last 24 hours) at 06/10/15 0850 Last data filed at 06/10/15 0600  Gross per 24 hour  Intake 744.17 ml  Output   1200 ml  Net -455.83 ml    Inpatient Medications:  . antiseptic oral rinse  7 mL Mouth Rinse q12n4p  . aspirin EC  81 mg Oral Daily  . atorvastatin  10 mg Oral QHS    . budesonide  0.25 mg Nebulization 4 times per day  . chlorhexidine  15 mL Mouth Rinse BID  . cholecalciferol  400 Units Oral Daily  . clopidogrel  75 mg Oral Daily  . diltiazem  180 mg Oral Daily  . diphenhydrAMINE  12.5 mg Intravenous Once  . famotidine  20 mg Oral Daily  . febuxostat  40 mg Oral Daily  . ferrous sulfate  325 mg Oral Daily  . heparin  5,000 Units Subcutaneous 3 times per day  . insulin aspart  0-5 Units Subcutaneous QHS  . insulin aspart  0-9 Units Subcutaneous TID WC  . ipratropium-albuterol  3 mL Nebulization Q6H  . metoprolol succinate  100 mg Oral Daily  . multivitamin-lutein  1 capsule Oral Daily  . sertraline  25 mg Oral Daily  . sodium chloride  3 mL Intravenous Q12H  . vitamin B-12  500 mcg Oral Daily  . vitamin C  500 mg Oral Daily   Infusions:  . sodium chloride 10 mL/hr at 06/09/15 1503  . diltiazem (CARDIZEM) infusion 5 mg/hr (06/10/15 0600)    Labs:  Recent Labs  06/09/15 0418 06/10/15 0512  NA 146* 142  K 5.2* 4.1  CL 106 103  CO2 32 32  GLUCOSE 92 121*  BUN 63* 46*  CREATININE 1.70* 1.20*  CALCIUM 9.1 8.8*    Recent Labs  06/07/15 1939  AST 15  ALT 14  ALKPHOS 60  BILITOT 0.7  PROT 6.9  ALBUMIN 3.5    Recent Labs  06/09/15 0418 06/10/15 0512  WBC 3.6 3.9  NEUTROABS 2.5 2.8  HGB 6.7* 8.0*  HCT 21.0* 24.6*  MCV 80.3 80.9  PLT 199 219    Recent  Labs  06/07/15 1939  TROPONINI 0.03   Invalid input(s): POCBNP No results for input(s): HGBA1C in the last 72 hours.   Weights: Filed Weights   06/07/15 1914  Weight: 162 lb (73.483 kg)     Radiology/Studies:  Dg Chest 1 View  05/20/2015  CLINICAL DATA:  Shortness of breath. EXAM: CHEST  1 VIEW COMPARISON:  05/17/2015. FINDINGS: Left IJ line in good anatomic position. Mediastinum hilar structures normal. Cardiomegaly with bilateral pulmonary alveolar infiltrates and pleural effusions consistent congestive heart failure. Bilateral pneumonia cannot be excluded. No  pneumothorax. Surgical clips right upper chest. IMPRESSION: 1. Lines and tubes in stable position. 2. Cardiomegaly with bilateral basilar pulmonary alveolar infiltrates and pleural effusions consistent with congestive heart failure. Bibasilar pneumonia cannot be excluded. Electronically Signed   By: Marcello Moores  Register   On: 05/20/2015 07:15   Dg Abd 1 View  05/16/2015  CLINICAL DATA:  Encounter for orogastric tube placement. EXAM: ABDOMEN - 1 VIEW COMPARISON:  None. FINDINGS: Tip of the orogastric tube extends well below the left hemidiaphragm consistent with it being in the mid stomach. There are surgical vascular clips in left upper quadrant. Normal bowel gas pattern. IMPRESSION: Orogastric tube tip projects within the mid stomach. Electronically Signed   By: Lajean Manes M.D.   On: 05/16/2015 21:52   Ct Chest Wo Contrast  06/08/2015  CLINICAL DATA:  Personal history of adenocarcinoma of lung it diagnosed April 2015 and treated with radiation therapy, hospitalized in September with respiratory failure, now with increased dyspnea and central chest pain EXAM: CT CHEST WITHOUT CONTRAST TECHNIQUE: Multidetector CT imaging of the chest was performed following the standard protocol without IV contrast. COMPARISON:  06/07/2015, 06/01/2014 FINDINGS: There is moderate left pleural effusion. The left upper lobe is clear except for mild apical scarring. There is anticipated compressive dependent atelectasis on the left. There is a moderate partially loculated pleural effusion on the right. There is more extensive presumably compressive atelectasis at the right lung base as compared to the left. There is postsurgical change with evidence of radiation fibrosis medially in the right upper lobe. Beam attenuation artifact is present as the result of right apical clips, and obscures underlying parenchyma. There is a 7 x 13m central opacity that may represent a combination of fibrosis and residual tumor. Limited study  without contrast shows no significant hilar or mediastinal adenopathy. There is cardiac enlargement. There is no significant pericardial effusion. There is coronary artery calcification as well as aortic calcification. No abnormalities involving the airway. Thoracic inlet and thyroid are normal. There are no acute musculoskeletal findings. Images to the upper abdomen partially demonstrate a known right renal cyst as well as a right adrenal mass. Large low-attenuation splenic lesion again identified. Hyper attenuating left renal lesion partially visualized and shows no change from prior study. IMPRESSION: Significant bilateral pleural effusions right greater than left with bilateral compressive atelectasis right worse than left. Postoperative change and known nodule right upper lobe with radiation fibrosis. Multiple abnormalities in the upper abdomen unchanged from prior study. Electronically Signed   By: RSkipper ClicheM.D.   On: 06/08/2015 17:02   [place]  06/08/2015  CLINICAL DATA:  Acute renal injury EXAM: RENAL / URINARY TRACT ULTRASOUND COMPLETE COMPARISON:  None. FINDINGS: Right Kidney: Length: 10.6 cm. Echogenicity within normal limits. No hydronephrosis visualized. There are several cysts in the right kidney, largest in the lower pole right kidney measuring 2.6 x 1.9 x 2.5 cm. The midpole right kidney cyst is complex  measuring 2.3 x 2.1 x 2.2 cm Left Kidney: Length: 10.6 cm. Echogenicity within normal limits. No hydronephrosis visualized. There are small cysts within the left kidney, largest measures 1.4 x 1.4 x 1.6 cm in the lower pole. Bladder: The bladder is not visualized. Patient has a Foley catheter in place. There is a complex cyst in the spleen measuring 5 x 5.1 x 5.3 cm. There is a left pleural effusion. IMPRESSION: Complex cyst in the midpole right kidney. Further evaluation with three-phase renal CT is recommended to exclude solid component. Simple cysts in both kidneys.  No hydronephrosis  bilaterally. Complex cyst identified in the spleen. Left pleural effusion. Electronically Signed   By: Abelardo Diesel M.D.   On: 06/08/2015 16:56   Dg Chest Port 1 View  06/09/2015  CLINICAL DATA:  Status post central line placement. EXAM: PORTABLE CHEST 1 VIEW COMPARISON:  Same day. FINDINGS: Stable cardiomegaly. No pneumothorax is noted. Stable bilateral pleural effusions are noted with probable underlying atelectasis. Bony thorax is unremarkable. Right-sided PICC line has been repositioned, with distal tip in expected position of the SVC. IMPRESSION: Stable cardiomegaly and bilateral pleural effusions are noted with probable underlying atelectasis. Right-sided PICC line has been repositioned with distal tip in expected position of SVC. Electronically Signed   By: Marijo Conception, M.D.   On: 06/09/2015 16:25   Dg Chest Port 1 View  06/09/2015  CLINICAL DATA:  PICC line placement EXAM: PORTABLE CHEST 1 VIEW COMPARISON:  06/07/2015 FINDINGS: Right PICC line is in place. The tip is in the upper right atrium approximately 3 cm deep to the cavoatrial junction. Cardiomegaly. Bilateral perihilar and lower lobe opacities with layering effusions are similar to prior study. IMPRESSION: Right PICC line tip in the upper right atrium 3 cm deep to the cavoatrial junction. No change in the appearance of the chest otherwise. Electronically Signed   By: Rolm Baptise M.D.   On: 06/09/2015 16:20   Dg Chest Portable 1 View  06/07/2015  CLINICAL DATA:  Wheezing and cough, shortness of breath. History of COPD, CHF, hypertension, lung cancer in 2014. EXAM: PORTABLE CHEST 1 VIEW COMPARISON:  Chest x-ray dated 05/20/2015. FINDINGS: Cardiomediastinal silhouette appears stable in size and configuration. Cardiomegaly is grossly unchanged. Again noted is central pulmonary vascular congestion and bibasilar airspace opacities. Overall appearance of the lungs is unchanged. Left-sided central line has been removed in the interval.  Surgical changes again seen over the right upper lung. IMPRESSION: Overall stable appearance of the chest x-ray. Again noted is cardiomegaly with central pulmonary vascular congestion suggesting volume overload/CHF (persistent versus recurrent). Bibasilar opacities are also unchanged and probably represent a combination of atelectasis and bibasilar pulmonary edema with adjacent pleural effusions. As also stated on the previous report, bibasilar pneumonia cannot be confidently excluded. Electronically Signed   By: Franki Cabot M.D.   On: 06/07/2015 19:58   Dg Chest Port 1 View  05/17/2015  CLINICAL DATA:  Respiratory failure. EXAM: PORTABLE CHEST - 1 VIEW COMPARISON:  None. FINDINGS: Endotracheal tube, NG line, left IJ line in stable position. Cardiomegaly with pulmonary vascular prominence and bilateral interstitial prominence consistent with congestive heart failure. Interval partial clearing of pulmonary interstitial edema. Small bilateral pleural effusions. Surgical clips right upper abdomen. IMPRESSION: 1. Lines and tubes in stable position. 2. Congestive heart failure with partial clearing of bilateral pulmonary interstitial edema. Small bilateral pleural effusions Electronically Signed   By: [place]   On: 05/17/2015 08:54   Dg Chest Port 1  View  05/16/2015  CLINICAL DATA:  Post intubation.  Central line placement. EXAM: PORTABLE CHEST - 1 VIEW COMPARISON:  05/15/2015 FINDINGS: Endotracheal tube placed with tip measuring 3.6 cm above the carina. Enteric tube tip is off the field of view but below the left hemidiaphragm. Left central venous catheter tip is over the low SVC region. No pneumothorax. Normal heart size and pulmonary vascularity. Some prominence of the central pulmonary arteries. Diffuse infiltration in the right lung increasing since previous study. This could be due to pneumonia or asymmetrical edema. Probable small bilateral pleural effusions and basilar atelectasis. Calcified and  tortuous aorta. Surgical clips in the right upper chest and upper abdomen. IMPRESSION: Appliances appear in satisfactory position. Increasing infiltration in the right lung suggesting pneumonia or asymmetric edema. Small bilateral pleural effusions with basilar atelectasis. Electronically Signed   By: Lucienne Capers M.D.   On: 05/16/2015 21:48   Dg Chest Port 1 View  05/15/2015  CLINICAL DATA:  Fall 1 week ago, shortness of Breath EXAM: PORTABLE CHEST - 1 VIEW COMPARISON:  12/22/2014 FINDINGS: Cardiomediastinal silhouette is stable. Central mild vascular joint without pulmonary edema. Small bilateral pleural effusion with bilateral basilar atelectasis or infiltrate. No pneumothorax. IMPRESSION: Small bilateral pleural effusion with bilateral basilar atelectasis or infiltrate. No pulmonary edema. No pneumothorax. Electronically Signed   By: Lahoma Crocker M.D.   On: 05/15/2015 12:40     Assessment and Plan  81 y.o. female with past medical history including coronary artery disease, paroxysmal atrial fibrillation, COPD, lung cancer, diabetes mellitus, and admitted to [facility] hospital on 12/22/2014 with chest pain, elective cardiac catheterization found to have mild 50% disease in the Atrial fibrillationdiagonal branch of the LAD, but high-grade 90% hazy stenosis in the marginal branch of the circumflex, successful PTCA and insertion of a 2.25x 8 millimeter Promus premier DES stent at the ostium of the OM3 vessel, started on aspirin and Plavix, hospital admission in August for electrolyte abnormality, digoxin toxicity, readmitted 06/07/2015 with shortness of breath, found to have atrial fibrillation , potassium of 7, renal failure .  Over the past 2 days, she has had a rapid decline in her blood count, currently receiving packed red blood cells this evening . Currently on digoxin for heart rate control for her atrial fibrillation . she is on BiPAP at nighttime . Potassium level has improved down to 5.2,  creatinine still elevated 1.7 with elevated BUN  drop in her blood count down to hemoglobin 6.7  1) atrial fibrillation on diltiazem infusion, 5 mg/hr Not a good candidate for anticoagulation as she is on aspirin and Plavix, anemia Unable to restore normal sinus rhythm at this time even high risk of stroke. She has been in atrial fibrillation for greater than 48 hours. Not a good candidate for TEE as again we would need to start anticoagulation prior to and following cardioversion. -----could change to diltiazem 180 mg daily  Digoxin on hold given elevated level  2) respiratory distress Likely multifactorial including profound anemia, atrial fibrillation, severe COPD, pleural effusions . --Management of COPD per pulmonary /steroids/bronchodilators --thoracentesis if risk acceptable. Would certainly help breathing Could consider gentle diuresis if renal function continues to improve  3) pleural effusions  Long-standing pleural effusions seen on prior admissions. now large Likely exacerbated by atrial fibrillation.  thoracentesis planned per ICU team  4) renal failure etiology of her renal failure is unclear Dramatically worse this admission, compared to her baseline ATN?   improved today creatinine 1.3  5) Digoxin toxicity  Level >2 Will d/c digoxin   Signed, Esmond Plants, MD [facility] 06/10/2015, 8:50 AM

## 2015-06-10 NOTE — Progress Notes (Signed)
PROGRESS NOTE  Regina Burke:[id number] DOB: September 06, 1932 DOA: 06/07/2015 PCP: Tracie Harrier, MD  Subjective 81 y/o f with hx of Chronic A-fib, Ca  Lung, Chronic  Hypoxemic and Hypercapnic Respiratory Failure, HTN, OSA, Type 2 DM admitted with  Hyperkalemia , acute Renal failure , A-fib with RVR This am appears  Weak - Still short of breath-On BIPAP Renal function improving CT Chest Bilat Pleural effusions- Rt worse than left with compressive atelectasis and Rt upper lobe nodule with radiation fibrosis    Objective: BP 149/64 mmHg  Pulse 103  Temp(Src) 97.3 F (36.3 C) (Oral)  Resp 28  Ht '5\' 5"'$  (1.651 m)  Wt 73.483 kg (162 lb)  BMI 26.96 kg/m2  SpO2 99%  Intake/Output Summary (Last 24 hours) at 06/10/15 1239 Last data filed at 06/10/15 1232  Gross per 24 hour  Intake    990 ml  Output   1050 ml  Net    -60 ml   Filed Weights   06/07/15 1914  Weight: 73.483 kg (162 lb)    Exam:  General: Pale looking. Dyspneic art rest  HEENT: PERRL; OP moist without lesions. Neck: supple, trachea midline, no thyromegaly Chest: normal to palpation Lungs: clear bilaterally without retractions or wheezes Cardiovascular:Irregular rate  Abdomen: soft, nontender, nondistended, positive bowel sounds Extremities: no clubbing, cyanosis, edema Neuro: alert and oriented, moves all extremities Derm: Pressure ulcer left butttock Lymph: no cervical or supraclavicular lymphadenopathy   Labs and imaging studies were reviewed*  Data Reviewed: Basic Metabolic Panel:  Recent Labs Lab 06/08/15 0405 06/08/15 1003 06/08/15 1644 06/09/15 0418 06/10/15 0512  NA 141 141 140 146* 142  K 5.6* 5.9* 5.5* 5.2* 4.1  CL 104 104 106 106 103  CO2 '29 29 30 '$ 32 32  GLUCOSE 151* 137* 134* 92 121*  BUN 74* 70* 66* 63* 46*  CREATININE 1.95* 1.83* 1.69* 1.70* 1.20*  CALCIUM 8.8* 9.0 8.5* 9.1 8.8*   Liver Function Tests:  Recent Labs Lab 06/07/15 1939  AST 15  ALT 14  ALKPHOS 60   BILITOT 0.7  PROT 6.9  ALBUMIN 3.5   No results for input(s): LIPASE, AMYLASE in the last 168 hours. No results for input(s): AMMONIA in the last 168 hours. CBC:  Recent Labs Lab 06/07/15 1939 06/08/15 0405 06/09/15 0418 06/10/15 0512  WBC 6.4 6.0 3.6 3.9  NEUTROABS 5.5  --  2.5 2.8  HGB 8.3* 7.5* 6.7* 8.0*  HCT 25.2* 23.0* 21.0* 24.6*  MCV 80.3 80.0 80.3 80.9  PLT 217 199 199 219   Cardiac Enzymes:    Recent Labs Lab 06/07/15 1939  TROPONINI 0.03   BNP (last 3 results)  Recent Labs  12/23/14 0434 05/15/15 1233 06/07/15 1939  BNP 213.8* 560.0* 941.0*    ProBNP (last 3 results) No results for input(s): PROBNP in the last 8760 hours.  CBG:  Recent Labs Lab 06/09/15 1134 06/09/15 1618 06/09/15 2138 06/10/15 0727 06/10/15 1127  GLUCAP 137* 115* 130* 108* 144*    Recent Results (from the past 240 hour(s))  MRSA PCR Screening     Status: None   Collection Time: 06/08/15 12:30 AM  Result Value Ref Range Status   MRSA by PCR NEGATIVE NEGATIVE Final    Comment:        The GeneXpert MRSA Assay (FDA approved for NASAL specimens only), is one component of a comprehensive MRSA colonization surveillance program. It is not intended to diagnose MRSA infection nor to guide or monitor treatment for MRSA infections.  Studies: Dg Chest Port 1 View  06/09/2015  CLINICAL DATA:  Status post central line placement. EXAM: PORTABLE CHEST 1 VIEW COMPARISON:  Same day. FINDINGS: Stable cardiomegaly. No pneumothorax is noted. Stable bilateral pleural effusions are noted with probable underlying atelectasis. Bony thorax is unremarkable. Right-sided PICC line has been repositioned, with distal tip in expected position of the SVC. IMPRESSION: Stable cardiomegaly and bilateral pleural effusions are noted with probable underlying atelectasis. Right-sided PICC line has been repositioned with distal tip in expected position of SVC. Electronically Signed   By: Marijo Conception, M.D.   On: 06/09/2015 16:25   Dg Chest Port 1 View  06/09/2015  CLINICAL DATA:  PICC line placement EXAM: PORTABLE CHEST 1 VIEW COMPARISON:  06/07/2015 FINDINGS: Right PICC line is in place. The tip is in the upper right atrium approximately 3 cm deep to the cavoatrial junction. Cardiomegaly. Bilateral perihilar and lower lobe opacities with layering effusions are similar to prior study. IMPRESSION: Right PICC line tip in the upper right atrium 3 cm deep to the cavoatrial junction. No change in the appearance of the chest otherwise. Electronically Signed   By: Rolm Baptise M.D.   On: 06/09/2015 16:20    Scheduled Meds: . antiseptic oral rinse  7 mL Mouth Rinse q12n4p  . aspirin EC  81 mg Oral Daily  . atorvastatin  10 mg Oral QHS  . budesonide  0.25 mg Nebulization 4 times per day  . chlorhexidine  15 mL Mouth Rinse BID  . cholecalciferol  400 Units Oral Daily  . clopidogrel  75 mg Oral Daily  . diltiazem  180 mg Oral Daily  . diphenhydrAMINE  12.5 mg Intravenous Once  . famotidine  20 mg Oral Daily  . febuxostat  40 mg Oral Daily  . ferrous sulfate  325 mg Oral Daily  . heparin  5,000 Units Subcutaneous 3 times per day  . insulin aspart  0-5 Units Subcutaneous QHS  . insulin aspart  0-9 Units Subcutaneous TID WC  . ipratropium-albuterol  3 mL Nebulization Q6H  . metoprolol succinate  100 mg Oral Daily  . multivitamin-lutein  1 capsule Oral Daily  . sertraline  25 mg Oral Daily  . sodium chloride  3 mL Intravenous Q12H  . vitamin B-12  500 mcg Oral Daily  . vitamin C  500 mg Oral Daily    Continuous Infusions: . sodium chloride 10 mL/hr at 06/09/15 1503  . diltiazem (CARDIZEM) infusion 5 mg/hr (06/10/15 0700)    Assessment/Plan:   1 Atrial fibrillation, with RVR:; On IV Cardizem. Digoxin d'cd due to digitoxicity  2 Acute kidney injury ([place]); Improving- Se Creat;1.2  3 Hyperkalemia; Improved with KayexalateContinue to monitor  4 Hypercapnic and Hypoxemic respiratory  failure- On BIPAP  5  Bilat pleural effusions -Rt worse than left - Unclear if due to CHF or malignancy  For thoracentesis today Check pleural fluid cytology  6 Pressure ulcer Left buttock; Watch for now   7 Anemia; Hgb  Improved to 8.0 following  Transfusion  Of 1 unit PRBC  8 Type 2 DM; On SSI   Code Status: Full code  Family Communication: son and daughter        06/10/2015, 12:39 PM  LOS: 3 days

## 2015-06-10 NOTE — Progress Notes (Signed)
PULMONARY/CCM NOTE  Date of admission: 06/07/15 Date of consult: 10/11 Reason for consultation: resp distress  Received PRBCs 10/12. Appears less dyspneic. No new complaints  Filed Vitals:   06/10/15 0500 06/10/15 0700 06/10/15 0800 06/10/15 0955  BP: 113/63 133/65 127/52 149/64  Pulse: 68 96 87 103  Temp:   97.5 F (36.4 C)   TempSrc:   Oral   Resp: $Remo'16 16 28   'ktkvb$ Height:      Weight:      SpO2: 99% 100% 99%     EXAM:  Gen: NAD Lungs: diminished BS throughout, no wheezes Cardiovascular: IRIR, rate control improved Abdomen: soft, NT, +BS Ext: no C/C/E Neuro: no focal deficits  DATA:  BMP Latest Ref Rng 06/10/2015 06/09/2015 06/08/2015  Glucose 65 - 99 mg/dL 121(H) 92 134(H)  BUN 6 - 20 mg/dL 46(H) 63(H) 66(H)  Creatinine 0.44 - 1.00 mg/dL 1.20(H) 1.70(H) 1.69(H)  Sodium 135 - 145 mmol/L 142 146(H) 140  Potassium 3.5 - 5.1 mmol/L 4.1 5.2(H) 5.5(H)  Chloride 101 - 111 mmol/L 103 106 106  CO2 22 - 32 mmol/L 32 32 30  Calcium 8.9 - 10.3 mg/dL 8.8(L) 9.1 8.5(L)    CBC Latest Ref Rng 06/10/2015 06/09/2015 06/08/2015  WBC 3.6 - 11.0 K/uL 3.9 3.6 6.0  Hemoglobin 12.0 - 16.0 g/dL 8.0(L) 6.7(L) 7.5(L)  Hematocrit 35.0 - 47.0 % 24.6(L) 21.0(L) 23.0(L)  Platelets 150 - 440 K/uL 219 199 199  PCT 0.17 ESR 107 mmHg  CXR: NNF  CT chest: Significant bilateral pleural effusions right greater than left with bilateral compressive atelectasis right worse than left. Postoperative change and known nodule right upper lobe with radiation fibrosis  IMPRESSION:   Acute on chronic respiratory failure Suspect COPD without overt bronchospasm Adenocarcinoma of lung, s/p XRT - minimal radiation fibrosis in area of tumor Bilateral effusions, moderate in size - likely this represents CHF/pulm edema   The effusions are large enough that they are probably contributing some to her dyspnea Anemia - PRBCs transfused 10/12 AFRVR - improved rate control  PLAN/REC:  Cont PRN BiPAP Cont scheduled  bronchodilators  Thoracentesis to be performed today Mgmt of AF per Cards and primary team Once she is off dilt gtt, she can be safely transferred to telemetry bed  Merton Border, MD PCCM service Mobile [phone] Pager [phone]

## 2015-06-11 DIAGNOSIS — N19 Unspecified kidney failure: Secondary | ICD-10-CM

## 2015-06-11 DIAGNOSIS — J9620 Acute and chronic respiratory failure, unspecified whether with hypoxia or hypercapnia: Secondary | ICD-10-CM

## 2015-06-11 DIAGNOSIS — Z9889 Other specified postprocedural states: Secondary | ICD-10-CM | POA: Insufficient documentation

## 2015-06-11 LAB — KAPPA/LAMBDA LIGHT CHAINS
KAPPA FREE LGHT CHN: 24.38 mg/L — AB (ref 3.30–19.40)
Kappa, lambda light chain ratio: 1.66 — ABNORMAL HIGH (ref 0.26–1.65)
LAMDA FREE LIGHT CHAINS: 14.68 mg/L (ref 5.71–26.30)

## 2015-06-11 LAB — BASIC METABOLIC PANEL
Anion gap: 4 — ABNORMAL LOW (ref 5–15)
BUN: 36 mg/dL — ABNORMAL HIGH (ref 6–20)
CALCIUM: 8.7 mg/dL — AB (ref 8.9–10.3)
CHLORIDE: 105 mmol/L (ref 101–111)
CO2: 33 mmol/L — AB (ref 22–32)
CREATININE: 1.05 mg/dL — AB (ref 0.44–1.00)
GFR calc Af Amer: 57 mL/min — ABNORMAL LOW (ref >60)
GFR calc non Af Amer: 49 mL/min — ABNORMAL LOW (ref >60)
GLUCOSE: 108 mg/dL — AB (ref 65–99)
Potassium: 4.6 mmol/L (ref 3.5–5.1)
Sodium: 142 mmol/L (ref 135–145)

## 2015-06-11 LAB — CBC WITH DIFFERENTIAL/PLATELET
Basophils Absolute: 0 10*3/uL (ref 0–0.1)
Basophils Relative: 1 %
Eosinophils Absolute: 0.2 10*3/uL (ref 0–0.7)
Eosinophils Relative: 5 %
HEMATOCRIT: 24.1 % — AB (ref 35.0–47.0)
Hemoglobin: 7.9 g/dL — ABNORMAL LOW (ref 12.0–16.0)
LYMPHS ABS: 0.6 10*3/uL — AB (ref 1.0–3.6)
LYMPHS PCT: 19 %
MCH: 26.4 pg (ref 26.0–34.0)
MCHC: 32.9 g/dL (ref 32.0–36.0)
MCV: 80.2 fL (ref 80.0–100.0)
MONO ABS: 0.3 10*3/uL (ref 0.2–0.9)
MONOS PCT: 8 %
NEUTROS ABS: 2.3 10*3/uL (ref 1.4–6.5)
Neutrophils Relative %: 67 %
Platelets: 217 10*3/uL (ref 150–440)
RBC: 3 MIL/uL — ABNORMAL LOW (ref 3.80–5.20)
RDW: 16 % — AB (ref 11.5–14.5)
WBC: 3.4 10*3/uL — ABNORMAL LOW (ref 3.6–11.0)

## 2015-06-11 LAB — GLUCOSE, CAPILLARY
GLUCOSE-CAPILLARY: 162 mg/dL — AB (ref 65–99)
Glucose-Capillary: 108 mg/dL — ABNORMAL HIGH (ref 65–99)
Glucose-Capillary: 123 mg/dL — ABNORMAL HIGH (ref 65–99)
Glucose-Capillary: 120 mg/dL — ABNORMAL HIGH (ref 65–99)

## 2015-06-11 LAB — IMMUNOFIXATION ELECTROPHORESIS
IgA: 30 mg/dL — ABNORMAL LOW (ref 64–422)
IgG (Immunoglobin G), Serum: 425 mg/dL — ABNORMAL LOW (ref 700–1600)
IgM, Serum: 47 mg/dL (ref 26–217)
Total Protein ELP: 5.8 g/dL — ABNORMAL LOW (ref 6.0–8.5)

## 2015-06-11 LAB — HAPTOGLOBIN: Haptoglobin: 260 mg/dL — ABNORMAL HIGH (ref 34–200)

## 2015-06-11 MED ORDER — DILTIAZEM HCL ER 180 MG PO CP24
300.0000 mg | ORAL_CAPSULE | Freq: Every day | ORAL | Status: DC
  Administered 2015-06-12 – 2015-06-15 (×4): 300 mg via ORAL
  Filled 2015-06-11 (×8): qty 1

## 2015-06-11 MED ORDER — IPRATROPIUM-ALBUTEROL 0.5-2.5 (3) MG/3ML IN SOLN
3.0000 mL | RESPIRATORY_TRACT | Status: DC | PRN
  Administered 2015-06-11 (×2): 3 mL via RESPIRATORY_TRACT
  Filled 2015-06-11 (×2): qty 3

## 2015-06-11 NOTE — Progress Notes (Signed)
Patient: Regina Burke / Admit Date: 06/07/2015 / Date of Encounter: 06/11/2015, 1:23 PM   Subjective: Feels better today, improving SOB after thoracentesis Eating well. Legs weak. Has not been out of bed much Slept well.  Still short of breath Review of telemetry shows she converted to normal sinus rhythm, rate 90  Review of Systems: ROS Constitutional: Positive for malaise/fatigue.  Respiratory: Positive for shortness of breath.  Cardiovascular: Negative.  Gastrointestinal: Negative.  Musculoskeletal: Negative.  Neurological: Positive for weakness.  Psychiatric/Behavioral: Negative All other systems reviewed and are negative.  Objective: Telemetry: Telemetry confirming normal sinus rhythm Physical Exam: Blood pressure 152/65, pulse 96, temperature 98.2 F (36.8 C), temperature source Oral, resp. rate 18, height '5\' 5"'$  (1.651 m), weight 162 lb (73.483 kg), SpO2 98 %. Body mass index is 26.96 kg/(m^2). General: Well developed, well nourished, Short of breath with talking  Head: Normocephalic, atraumatic, sclera non-icteric, no xanthomas, nares are without discharge. Neck: Negative for carotid bruits. JVD not elevated. Heart:  RRR with 2+ murmurs right sternal border  Lungs : Decreased breath sounds bilaterally throughout Abdomen: Soft, non-tender, non-distended with normoactive bowel sounds. No hepatomegaly. No rebound/guarding. No obvious abdominal masses. Msk: Strength and tone appear normal for age. Extremities: No clubbing or cyanosis. No edema. Distal pedal pulses are 2+ and equal bilaterally. Neuro: Alert and oriented X 3. No facial asymmetry. No focal deficit. Moves all extremities spontaneously. Psych: Responds to questions appropriately with a normal affect.   Intake/Output Summary (Last 24 hours) at 06/11/15 1323 Last data filed at 06/11/15 0900  Gross per 24 hour  Intake    535 ml  Output    500 ml  Net     35 ml    Inpatient  Medications:  . antiseptic oral rinse  7 mL Mouth Rinse q12n4p  . aspirin EC  81 mg Oral Daily  . atorvastatin  10 mg Oral QHS  . chlorhexidine  15 mL Mouth Rinse BID  . cholecalciferol  400 Units Oral Daily  . clopidogrel  75 mg Oral Daily  . [START ON 06/12/2015] diltiazem  300 mg Oral Daily  . diphenhydrAMINE  12.5 mg Intravenous Once  . famotidine  20 mg Oral Daily  . febuxostat  40 mg Oral Daily  . ferrous sulfate  325 mg Oral Daily  . heparin  5,000 Units Subcutaneous 3 times per day  . insulin aspart  0-5 Units Subcutaneous QHS  . insulin aspart  0-9 Units Subcutaneous TID WC  . metoprolol succinate  100 mg Oral Daily  . multivitamin-lutein  1 capsule Oral Daily  . sertraline  25 mg Oral Daily  . sodium chloride  3 mL Intravenous Q12H  . vitamin B-12  500 mcg Oral Daily  . vitamin C  500 mg Oral Daily   Infusions:  . sodium chloride 10 mL/hr at 06/09/15 1503    Labs:  Recent Labs  06/10/15 0512 06/11/15 0448  NA 142 142  K 4.1 4.6  CL 103 105  CO2 32 33*  GLUCOSE 121* 108*  BUN 46* 36*  CREATININE 1.20* 1.05*  CALCIUM 8.8* 8.7*   No results for input(s): AST, ALT, ALKPHOS, BILITOT, PROT, ALBUMIN in the last 72 hours.  Recent Labs  06/10/15 0512 06/11/15 0448  WBC 3.9 3.4*  NEUTROABS 2.8 2.3  HGB 8.0* 7.9*  HCT 24.6* 24.1*  MCV 80.9 80.2  PLT 219 217   No results for input(s): CKTOTAL, CKMB, TROPONINI in the last 72 hours.  Invalid input(s): POCBNP No results for input(s): HGBA1C in the last 72 hours.   Weights: Filed Weights   06/07/15 1914  Weight: 162 lb (73.483 kg)     Radiology/Studies:  Dg Chest 1 View  06/10/2015  CLINICAL DATA:  Status post right thoracentesis EXAM: CHEST  1 VIEW COMPARISON:  06/09/2015 FINDINGS: There is been near complete resolution of the right-sided pleural effusion following thoracentesis. Adequate re-expansion of the lung is noted with no evidence of pneumothorax. Persistent left-sided pleural effusion is seen.  Cardiac shadow is stable. A right-sided PICC line is stable. IMPRESSION: No pneumothorax following right thoracentesis. If clinically desired and the left pleural effusion could be drained on 06/11/2015 Electronically Signed   By: Inez Catalina M.D.   On: 06/10/2015 16:00   Dg Chest 1 View  05/20/2015  CLINICAL DATA:  Shortness of breath. EXAM: CHEST  1 VIEW COMPARISON:  05/17/2015. FINDINGS: Left IJ line in good anatomic position. Mediastinum hilar structures normal. Cardiomegaly with bilateral pulmonary alveolar infiltrates and pleural effusions consistent congestive heart failure. Bilateral pneumonia cannot be excluded. No pneumothorax. Surgical clips right upper chest. IMPRESSION: 1. Lines and tubes in stable position. 2. Cardiomegaly with bilateral basilar pulmonary alveolar infiltrates and pleural effusions consistent with congestive heart failure. Bibasilar pneumonia cannot be excluded. Electronically Signed   By: Marcello Moores  Register   On: 05/20/2015 07:15   Dg Abd 1 View  05/16/2015  CLINICAL DATA:  Encounter for orogastric tube placement. EXAM: ABDOMEN - 1 VIEW COMPARISON:  None. FINDINGS: Tip of the orogastric tube extends well below the left hemidiaphragm consistent with it being in the mid stomach. There are surgical vascular clips in left upper quadrant. Normal bowel gas pattern. IMPRESSION: Orogastric tube tip projects within the mid stomach. Electronically Signed   By: Lajean Manes M.D.   On: 05/16/2015 21:52   Ct Chest Wo Contrast  06/08/2015  CLINICAL DATA:  Personal history of adenocarcinoma of lung it diagnosed April 2015 and treated with radiation therapy, hospitalized in September with respiratory failure, now with increased dyspnea and central chest pain EXAM: CT CHEST WITHOUT CONTRAST TECHNIQUE: Multidetector CT imaging of the chest was performed following the standard protocol without IV contrast. COMPARISON:  06/07/2015, 06/01/2014 FINDINGS: There is moderate left pleural effusion.  The left upper lobe is clear except for mild apical scarring. There is anticipated compressive dependent atelectasis on the left. There is a moderate partially loculated pleural effusion on the right. There is more extensive presumably compressive atelectasis at the right lung base as compared to the left. There is postsurgical change with evidence of radiation fibrosis medially in the right upper lobe. Beam attenuation artifact is present as the result of right apical clips, and obscures underlying parenchyma. There is a 7 x 37m central opacity that may represent a combination of fibrosis and residual tumor. Limited study without contrast shows no significant hilar or mediastinal adenopathy. There is cardiac enlargement. There is no significant pericardial effusion. There is coronary artery calcification as well as aortic calcification. No abnormalities involving the airway. Thoracic inlet and thyroid are normal. There are no acute musculoskeletal findings. Images to the upper abdomen partially demonstrate a known right renal cyst as well as a right adrenal mass. Large low-attenuation splenic lesion again identified. Hyper attenuating left renal lesion partially visualized and shows no change from prior study. IMPRESSION: Significant bilateral pleural effusions right greater than left with bilateral compressive atelectasis right worse than left. 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 Multiple abnormalities in the upper abdomen unchanged from prior study. Electronically Signed   By: Skipper Cliche M.D.   On: 06/08/2015 17:02   US Renal  06/08/2015  CLINICAL DATA:  Acute renal injury EXAM: RENAL / URINARY TRACT ULTRASOUND COMPLETE COMPARISON:  None. FINDINGS: Right Kidney: Length: 10.6 cm. Echogenicity within normal limits. No hydronephrosis visualized. There are several cysts in the right kidney, largest in the lower pole right kidney measuring 2.6 x 1.9 x 2.5 cm. The  midpole right kidney cyst is complex measuring 2.3 x 2.1 x 2.2 cm Left Kidney: Length: 10.6 cm. Echogenicity within normal limits. No hydronephrosis visualized. There are small cysts within the left kidney, largest measures 1.4 x 1.4 x 1.6 cm in the lower pole. Bladder: The bladder is not visualized. Patient has a Foley catheter in place. There is a complex cyst in the spleen measuring 5 x 5.1 x 5.3 cm. There is a left pleural effusion. IMPRESSION: Complex cyst in the midpole right kidney. Further evaluation with three-phase renal CT is recommended to exclude solid component. Simple cysts in both kidneys.  No hydronephrosis bilaterally. Complex cyst identified in the spleen. Left pleural effusion. Electronically Signed   By: Abelardo Diesel M.D.   On: 06/08/2015 16:56   Dg Chest Port 1 View  06/09/2015  CLINICAL DATA:  Status post central line placement. EXAM: PORTABLE CHEST 1 VIEW COMPARISON:  Same day. FINDINGS: Stable cardiomegaly. No pneumothorax is noted. Stable bilateral pleural effusions are noted with probable underlying atelectasis. Bony thorax is unremarkable. Right-sided PICC line has been repositioned, with distal tip in expected position of the SVC. IMPRESSION: Stable cardiomegaly and bilateral pleural effusions are noted with probable underlying atelectasis. Right-sided PICC line has been repositioned with distal tip in expected position of SVC. Electronically Signed   By: Marijo Conception, M.D.   On: 06/09/2015 16:25   Dg Chest Port 1 View  06/09/2015  CLINICAL DATA:  PICC line placement EXAM: PORTABLE CHEST 1 VIEW COMPARISON:  06/07/2015 FINDINGS: Right PICC line is in place. The tip is in the upper right atrium approximately 3 cm deep to the cavoatrial junction. Cardiomegaly. Bilateral perihilar and lower lobe opacities with layering effusions are similar to prior study. IMPRESSION: Right PICC line tip in the upper right atrium 3 cm deep to the cavoatrial junction. No change in the appearance  of the chest otherwise. Electronically Signed   By: Rolm Baptise M.D.   On: 06/09/2015 16:20   Dg Chest Portable 1 View  06/07/2015  CLINICAL DATA:  Wheezing and cough, shortness of breath. History of COPD, CHF, hypertension, lung cancer in 2014. EXAM: PORTABLE CHEST 1 VIEW COMPARISON:  Chest x-ray dated 05/20/2015. FINDINGS: Cardiomediastinal silhouette appears stable in size and configuration. Cardiomegaly is grossly unchanged. Again noted is central pulmonary vascular congestion and bibasilar airspace opacities. Overall appearance of the lungs is unchanged. Left-sided central line has been removed in the interval. Surgical changes again seen over the right upper lung. IMPRESSION: Overall stable appearance of the chest x-ray. Again noted is cardiomegaly with central pulmonary vascular congestion suggesting volume overload/CHF (persistent versus recurrent). Bibasilar opacities are also unchanged and probably represent a combination of atelectasis and bibasilar pulmonary edema with adjacent pleural effusions. As also stated on the previous report, bibasilar pneumonia cannot be confidently excluded. Electronically Signed   By: Franki Cabot M.D.   On: 06/07/2015 19:58   Dg Chest Port 1 View  05/17/2015  CLINICAL DATA:  Respiratory failure. EXAM: PORTABLE CHEST -  1 VIEW COMPARISON:  None. FINDINGS: Endotracheal tube, NG line, left IJ line in stable position. Cardiomegaly with pulmonary vascular prominence and bilateral interstitial prominence consistent with congestive heart failure. Interval partial clearing of pulmonary interstitial edema. Small bilateral pleural effusions. Surgical clips right upper abdomen. IMPRESSION: 1. Lines and tubes in stable position. 2. Congestive heart failure with partial clearing of bilateral pulmonary interstitial edema. Small bilateral pleural effusions Electronically Signed   By: [place]   On: 05/17/2015 08:54   Dg Chest Port 1 View  05/16/2015  CLINICAL DATA:  Post  intubation.  Central line placement. EXAM: PORTABLE CHEST - 1 VIEW COMPARISON:  05/15/2015 FINDINGS: Endotracheal tube placed with tip measuring 3.6 cm above the carina. Enteric tube tip is off the field of view but below the left hemidiaphragm. Left central venous catheter tip is over the low SVC region. No pneumothorax. Normal heart size and pulmonary vascularity. Some prominence of the central pulmonary arteries. Diffuse infiltration in the right lung increasing since previous study. This could be due to pneumonia or asymmetrical edema. Probable small bilateral pleural effusions and basilar atelectasis. Calcified and tortuous aorta. Surgical clips in the right upper chest and upper abdomen. IMPRESSION: Appliances appear in satisfactory position. Increasing infiltration in the right lung suggesting pneumonia or asymmetric edema. Small bilateral pleural effusions with basilar atelectasis. Electronically Signed   By: Lucienne Capers M.D.   On: 05/16/2015 21:48   Dg Chest Port 1 View  05/15/2015  CLINICAL DATA:  Fall 1 week ago, shortness of Breath EXAM: PORTABLE CHEST - 1 VIEW COMPARISON:  12/22/2014 FINDINGS: Cardiomediastinal silhouette is stable. Central mild vascular joint without pulmonary edema. Small bilateral pleural effusion with bilateral basilar atelectasis or infiltrate. No pneumothorax. IMPRESSION: Small bilateral pleural effusion with bilateral basilar atelectasis or infiltrate. No pulmonary edema. No pneumothorax. Electronically Signed   By: Lahoma Crocker M.D.   On: 05/15/2015 12:40   US Thoracentesis Asp Pleural Space W/img Guide  06/10/2015  CLINICAL DATA:  Bilateral pleural effusions EXAM: ULTRASOUND GUIDED RIGHT THORACENTESIS PROCEDURE: An ultrasound guided thoracentesis was thoroughly discussed with the patient and questions answered. The benefits, risks, alternatives and complications were also discussed. The patient understands and wishes to proceed with the procedure. Written consent was  obtained. Ultrasound was performed to localize and mark an adequate pocket of fluid in the right chest. The area was then prepped and draped in the normal sterile fashion. 1% Lidocaine was used for local anesthesia. Under ultrasound guidance a 6 French thoracentesis catheter was introduced. Thoracentesis was performed. The catheter was removed and a dressing applied. COMPLICATIONS: None FINDINGS: A total of approximately 1.1 L of bloody fluid was removed. A fluid sample was sent for laboratory analysis. IMPRESSION: Successful ultrasound guided right thoracentesis yielding 1.1 L of bloody pleural fluid. Patient has a moderate to large effusion on the left which could be drained on 06/11/2015 if clinically desired. Electronically Signed   By: Inez Catalina M.D.   On: 06/10/2015 16:32     Assessment and Plan  81 y.o. female with past medical history including coronary artery disease, paroxysmal atrial fibrillation, COPD, lung cancer, diabetes mellitus, and admitted to [facility] on 12/22/2014 with chest pain, elective cardiac catheterization found to have mild 50% disease in the Atrial fibrillationdiagonal branch of the LAD, but high-grade 90% hazy stenosis in the marginal branch of the circumflex, successful PTCA and insertion of a 2.25x 8 millimeter Promus premier DES stent at the ostium of the OM3 vessel, started on aspirin  and Plavix, hospital admission in August for electrolyte abnormality, digoxin toxicity, readmitted 06/07/2015 with shortness of breath, found to have atrial fibrillation , potassium of 7, renal failure .  Over the past 2 days, she has had a rapid decline in her blood count, currently receiving packed red blood cells this evening . Currently on digoxin for heart rate control for her atrial fibrillation . she is on BiPAP at nighttime . Potassium level has improved down to 5.2, creatinine still elevated 1.7 with elevated BUN  drop in her blood count down to hemoglobin 6.7   1)  atrial fibrillation Converted to normal sinus rhythm after thoracentesis Possibly aided by improved oxygenation Still short of breath today We'll increase diltiazem up to 300 mg tomorrow, continue metoprolol for rate and rhythm control Anticoagulation on hold given hematocrit 24 Still not clear why blood count dropped  2) respiratory distress Likely multifactorial including profound anemia, atrial fibrillation, severe COPD, pleural effusions . --Management of COPD per pulmonary /steroids/bronchodilators --She did well after thoracentesis yesterday -----Would consider thoracentesis on the other side given moderate to large size effusion and continued shortness of breath  3) pleural effusions  Successful procedure yesterday, 1.1 L removed Ultrasound showing moderate to large size effusion on the other side Given continued shortness of breath, would recommend thoracentesis for symptom relief  4) renal failure Back to baseline  5) Digoxin toxicity Level >2 Will d/c digoxin. We'll not restart given this is the second time with dig level problems  6) Anemia  Given 1 unit of blood, hematocrit 21 up to 24   given her underlying COPD, pleural effusion, still very short of breath   consider additional unit She is already taking iron   Signed, Esmond Plants, MD [facility] 06/11/2015, 1:23 PM

## 2015-06-11 NOTE — Care Management Note (Addendum)
Case Management Note  Patient Details  Name: Regina Burke MRN: [id number] Date of Birth: Jan 09, 1933  Subjective/Objective: Received call from daughter, Regina Burke. She states she is the primary care giver. She has had back problems and would rather she go back to [facility] if at all possible. Daughter did request that she have hospice services if she goes home and patient meets criteria.  Daughter is a Marine scientist and is familiar with the services. She is requesting a Palliative Care Consult.  Palliative Care consult in                  Action/Plan:   Expected Discharge Date:                  Expected Discharge Plan:  [place]  In-House Referral:  Clinical Social Work  Discharge planning Services  CM Consult  Post Acute Care Choice:    Choice offered to:     DME Arranged:    DME Agency:     HH Arranged:    [place] Agency:     Status of Service:  In process, will continue to follow  Medicare Important Message Given:    Date Medicare IM Given:    Medicare IM give by:    Date Additional Medicare IM Given:    Additional Medicare Important Message give by:     If discussed at [place] of Stay Meetings, dates discussed:    Additional Comments:  Jolly Mango, RN 06/11/2015, 11:07 AM

## 2015-06-11 NOTE — Care Management Note (Signed)
Case Management Note  Patient Details  Name: Regina Burke MRN: [id number] Date of Birth: 1933-04-17  Subjective/Objective:   S/P right thoracentesis with resolution of pleural effusion. Reviewed CSW note. Son and patient to decide if home if more appropriate.  Met with patient at bedside. She expressed she would be happy at [facility] with a private room. She states her son and daughter live in her home and they are with her 24 hours per day. She has 2 walkers. At [facility] she used a BiPAP, O2 @ 2 L and a nebulizer. She will need these things if she goes home. Her last sleep study was approximately 5 years ago. Will have to inquire if she will need to be re qualified.  List of home care providers given to patient.   Plan is transfer to telemetry today.  PT eval pending.            Action/Plan:   Expected Discharge Date:                  Expected Discharge Plan:  Skilled Nursing Facility  In-House Referral:  Clinical Social Work  Discharge planning Services  CM Consult  Post Acute Care Choice:    Choice offered to:     DME Arranged:    DME Agency:     HH Arranged:    [place] Agency:     Status of Service:  In process, will continue to follow  Medicare Important Message Given:    Date Medicare IM Given:    Medicare IM give by:    Date Additional Medicare IM Given:    Additional Medicare Important Message give by:     If discussed at [place] of Stay Meetings, dates discussed:    Additional Comments:  Jolly Mango, RN 06/11/2015, 8:39 AM

## 2015-06-11 NOTE — Evaluation (Signed)
Physical Therapy Evaluation Patient Details Name: Regina Burke MRN: [id number] DOB: 09/19/1932 Today's Date: 06/11/2015   History of Present Illness  Pt was admitted to the hospital with increased potassium, A-fib, and rapid ventricular response. Pt experiencing SOB; had thoracentesis performed 06/10/15  Clinical Impression  Pt presents with hx of COPD, CHF, HTN, MI, lung cancer, GERD, Gout, and CKD. Examination reveals that pt performs all mobility at min assist and is able to ambulate on 3L O2 about 30 ft before needing to take a break. She also displays weakness in bilateral LEs. Pt was not unsteady with ambulation, however she has recent history of falls that is extensive when not using her assistive device. She will need further reinforcement on this. She is very pleasant to work with and motivated to perform therapy tasks. Due to her strength and endurance deficits, she will continue to benefit from skilled PT in order eventually return to baseline level of function.     Follow Up Recommendations SNF    Equipment Recommendations  None recommended by PT    Recommendations for Other Services       Precautions / Restrictions Precautions Precautions: Fall Precaution Comments: Pt has fallen several times recently. Restrictions Weight Bearing Restrictions: No      Mobility  Bed Mobility Overal bed mobility: Needs Assistance Bed Mobility: Supine to Sit     Supine to sit: Min assist     General bed mobility comments: Pt needs trunk assist and cues for hand placement to help get to EOB.   Transfers Overall transfer level: Needs assistance Equipment used: Rolling walker (2 wheeled) Transfers: Sit to/from Stand Sit to Stand: Min assist         General transfer comment: Requires bilat UE support to achieve lift off and standing balance. Needs cues for hand placement  Ambulation/Gait Ambulation/Gait assistance: Min assist Ambulation Distance (Feet): 30 Feet Assistive  device: Rolling walker (2 wheeled) Gait Pattern/deviations: Shuffle;Decreased step length - right;Decreased step length - left;Step-through pattern Gait velocity: decreased Gait velocity interpretation: <1.8 ft/sec, indicative of risk for recurrent falls General Gait Details: Pt has gait that is slow but not too labored. She fatigues after about 20 ft of ambulation while on 3L O2. Pt never in acute distress, just minor fatigue and wishing to sit.   Stairs            Wheelchair Mobility    Modified Rankin (Stroke Patients Only)       Balance Overall balance assessment: History of Falls                                           Pertinent Vitals/Pain Pain Assessment: No/denies pain    Home Living Family/patient expects to be discharged to:: Private residence Living Arrangements: Children Available Help at Discharge: Family Type of Home: House Home Access: Stairs to enter   [employer] of Steps: 1 step to get onto porch, one step to get into house, one step inside house between rooms. Can hold onto wall for support with each step. Home Layout: One level Home Equipment: Walker - 2 wheels;Cane - single point;Shower seat;Grab bars - tub/shower      Prior Function Level of Independence: Independent with assistive device(s)         Comments: Pt ususally able to perform household ambulation, but recently not using assistive device so has been having falls  Hand Dominance        Extremity/Trunk Assessment   Upper Extremity Assessment: Overall WFL for tasks assessed           Lower Extremity Assessment: Generalized weakness (Gross LE MMT 4/5)         Communication   Communication: No difficulties  Cognition Arousal/Alertness: Awake/alert Behavior During Therapy: WFL for tasks assessed/performed Overall Cognitive Status: Within Functional Limits for tasks assessed                      General Comments       Exercises Other Exercises Other Exercises: Pt performed bilateral therex x 10 reps at supervision for proper technique. Exercises performed: ankle pumps, quad sets, glute sets, LAQ, SLR, and hip abd.      Assessment/Plan    PT Assessment Patient needs continued PT services  PT Diagnosis Generalized weakness;Difficulty walking   PT Problem List Decreased strength;Decreased activity tolerance;Cardiopulmonary status limiting activity  PT Treatment Interventions DME instruction;Gait training;Stair training;Functional mobility training;Therapeutic activities;Therapeutic exercise;Balance training   PT Goals (Current goals can be found in the Care Plan section) Acute Rehab PT Goals Patient Stated Goal: to return to rehab PT Goal Formulation: With patient Time For Goal Achievement: 06/25/15 Potential to Achieve Goals: Good    Frequency Min 2X/week   Barriers to discharge        Co-evaluation               End of Session Equipment Utilized During Treatment: Gait belt Activity Tolerance: Patient tolerated treatment well Patient left: in chair;with call bell/phone within reach;with chair alarm set;with family/visitor present Nurse Communication: Mobility status         Time: 5038-8828 PT Time Calculation (min) (ACUTE ONLY): 22 min   Charges:         PT G CodesJanyth Contes 06-15-15, 5:13 PM  Janyth Contes, SPT. [phone]

## 2015-06-11 NOTE — Consult Note (Signed)
Palliative Medicine Inpatient Consult Note   Name: Regina Burke Date: 06/11/2015 MRN: [id number]  DOB: 1932/08/31  Referring Physician: Tracie Harrier, MD  Palliative Care consult requested for this 81 y.o. female for goals of medical therapy in patient with acute on chronic respiratory failure, pleural effusions, COPD, recently treated lung cancer, and other conditions are as detailed under IMPRESSION below.  TODAY'S DISCUSSIONS AND DECISIONS: I first met patient late in the evening on Oct 11, when nursing asked me to come by b/c all four of her adult children wanted to make her DNR and were present. Unfortunately, when I came there 15 minutes later, only one daughter was present. 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  I had to ask about siblings and other possible decision makers. It was revealed that the youngest son had been made HCPOA only a couple of weeks ago. The daughter wanted to say that we should consider the fact that there are dynamics about the family relationships that in her mind mean that the youngest son should not have say over his mother's Healthcare.  She went into some detail, but I will not mention all of that here.  I had not been formally consulted, and this was a situation where I was not going to get involved too deeply as there was not point in discussing family dynamics until decision makers were identified accurately.  My hope was that the patient would soon be able to indicate her own wishes.  Dr Ginette Pitman formally consulted me on Oct 12, but he told me that he himself spoke to the patient and she wants to remain a full code.  Given that she wishes to remain full code, my role will be in addressing possible Hospice scenarios.  However, it is apparent from the notes that there is interest in Hospice but also in SNF and some back and forth about which SNF with  private room needs etc  Patient  also is stating that she is going home. I mentioned to Dr Ginette Pitman, on that day, that perhaps I can better serve the pt after we see how she does with a little more time and treatment.  Then we can have a much better idea about the best recommendations to make to her.   At the time of my visit this evening, pt is too tired to discuss anything.  No family is present. She has been moved out of ICU.  She might need a thoracentesis on the left per report.   I would recommend that she be supported further medically over the weekend and then I will see her on Monday.  I am out of the hospital until 1pm on Monday.  I will talk with pt, decision makers in the family,  care managers, and Dr Ginette Pitman about patient's wishes and potential placement in SNF OR Hospice. I have concerns about a Hospice diagnosis since we do not have a clear idea about these pleural effusions in terms of etiology and liklihood of recurrence as yet. Chronic Lung Disease would be a potential diagnosis.  In the meantime, it would be great to have a copy of the current HCPOA form in the paper chart.  This is needed when family members disagree with each other. Though I think that by Monday, if patient continues to improve, she herself should make her healthcare decisions.     IMPRESSION:  1.  Acute on chronic respiratory failure (hypoxic and hypercapneic) ---likely largely related to bilateral pleural effusions of unclear etiology ---she had a right thoracentesis on 10/13 and improved markedly after that ---would encourage a left thoracentesis if attending agrees as it has been suggested by others as well ---cytology is pending, but oncology does not feel there are signs of lung cancer recurrence  2.  COPD --advanced but reportedly stable at home (until recently) on 3 LPM oxygen ---scheduled nebs are DCd  3.  Acute on chronic Diastolic CHF  ---Echo in September showed an Ef of 55-60% 4.  Essential HTN 5.  Afib with RVR --was on cardizem  drip now [facility] --got digoxin now [facility] due to Dig toxicity times two 6.  DM2 7. H/O Lung Cancer (adenocarciinoma with lepidin pattern) ---s/p XRT 2015 ---no obvious sign of recurrence per oncology consult ---nodule RUL ---radiation fibrosis is noted 8.  Dyslipidemia 9. Cushing's Dz (s/p adrenalectomy 1980's) 10.Depression 11. Gout 12.  DJD 13.  Acute on Chronic renal failure ---CKD stage III 14.  Anemia --severe and transfused ---iron and B12 levels wnl  ---unclear cause ---transfused for Hgb of 6.7 15.  CAD with MI and stent in past 23.  GERD 17.  Hyperkalemia --treated  18.  Poor appetite.      REVIEW OF SYSTEMS:  Patient is not able to provide ROS  SPIRITUAL SUPPORT SYSTEM: Yes.  SOCIAL HISTORY:  reports that she quit smoking about 17 years ago. Her smoking use included Cigarettes. She has a 45 pack-year smoking history. She has never used smokeless tobacco. She reports that she drinks alcohol. She reports that she does not use illicit drugs.  LEGAL DOCUMENTS:  None--youngest son is reported to be current HCPOA  CODE STATUS: Full code  PAST MEDICAL HISTORY: Past Medical History  Diagnosis Date  . COPD (chronic obstructive pulmonary disease) ([place])   . CHF (congestive heart failure) ([place])   . Hypertension   . Rapid palpitations 2014    Seen at [facility], may have been atrial fibrillation  . DOE (dyspnea on exertion)     Started after treatment for lung cancer  . High cholesterol   . Heart murmur   . Myocardial infarction ([place]) 12/22/2014   . Lung cancer ([place]) dx'd 2014    S/P radiation 2015  . Sleep apnea   . Cushing's disease ([place])   . Type II diabetes mellitus ([place])   . Anemia   . GERD (gastroesophageal reflux disease)   . History of hiatal hernia   . Migraine   . Arthritis   . Gout   . Depression   . Chronic kidney disease (CKD), stage III (moderate)     PAST SURGICAL HISTORY:  Past Surgical History  Procedure Laterality Date  . Adrenalectomy Left  1980's    "[place]"  . Abdominal hysterectomy    . Appendectomy    . Cholecystectomy    . Coronary angioplasty with stent placement  12/23/2014  . Tonsillectomy    . Fracture surgery    . Wrist fracture surgery Bilateral ~ 2000  . Breast cyst excision Left   . Tubal ligation    . Left heart catheterization with coronary angiogram N/A 12/23/2014    Procedure: LEFT HEART CATHETERIZATION WITH CORONARY ANGIOGRAM;  Surgeon: Burnell Blanks, MD;  Location: [facility] CATH LAB;  Service: Cardiovascular;  Laterality: N/A;  . Percutaneous coronary stent intervention (pci-s)  12/23/2014    Procedure: PERCUTANEOUS CORONARY STENT INTERVENTION (PCI-S);  Surgeon: Annita Brod  Angelena Form, MD;  Location: [facility] CATH LAB;  Service: Cardiovascular;;  Promus 2.25x8    ALLERGIES:  is allergic to ciprofloxacin; doxycycline; penicillins; sulfa antibiotics; and morphine and related.  MEDICATIONS:  Current Facility-Administered Medications  Medication Dose Route Frequency Provider Last Rate Last Dose  . 0.9 %  sodium chloride infusion   Intravenous Continuous Wilhelmina Mcardle, MD 10 mL/hr at 06/09/15 1503    . acetaminophen (TYLENOL) tablet 650 mg  650 mg Oral Q6H PRN Lytle Butte, MD       Or  . acetaminophen (TYLENOL) suppository 650 mg  650 mg Rectal Q6H PRN Lytle Butte, MD      . ALPRAZolam Duanne Moron) tablet 0.25 mg  0.25 mg Oral TID PRN Lytle Butte, MD   0.25 mg at 06/11/15 1849  . antiseptic oral rinse (CPC / CETYLPYRIDINIUM CHLORIDE 0.05%) solution 7 mL  7 mL Mouth Rinse q12n4p Vishwanath Hande, MD   7 mL at 06/10/15 1732  . aspirin EC tablet 81 mg  81 mg Oral Daily Lytle Butte, MD   81 mg at 06/11/15 9798  . atorvastatin (LIPITOR) tablet 10 mg  10 mg Oral QHS Lytle Butte, MD   10 mg at 06/10/15 2220  . chlorhexidine (PERIDEX) 0.12 % solution 15 mL  15 mL Mouth Rinse BID Tracie Harrier, MD   15 mL at 06/10/15 2226  . cholecalciferol (VITAMIN D) tablet 400 Units  400 Units Oral Daily Lytle Butte, MD    400 Units at 06/11/15 [phone]  . clopidogrel (PLAVIX) tablet 75 mg  75 mg Oral Daily Lytle Butte, MD   75 mg at 06/11/15 9417  . [START ON 06/12/2015] diltiazem (DILACOR XR) 24 hr capsule 300 mg  300 mg Oral Daily Minna Merritts, MD      . diphenhydrAMINE (BENADRYL) injection 12.5 mg  12.5 mg Intravenous Once Harrie Foreman, MD   12.5 mg at 06/08/15 0754  . docusate sodium (COLACE) capsule 100 mg  100 mg Oral BID PRN Lytle Butte, MD      . famotidine (PEPCID) tablet 20 mg  20 mg Oral Daily Lytle Butte, MD   20 mg at 06/11/15 4081  . febuxostat (ULORIC) tablet 40 mg  40 mg Oral Daily Lytle Butte, MD   40 mg at 06/11/15 4481  . ferrous sulfate tablet 325 mg  325 mg Oral Daily Lytle Butte, MD   325 mg at 06/11/15 8563  . heparin injection 5,000 Units  5,000 Units Subcutaneous 3 times per day Lytle Butte, MD   5,000 Units at 06/11/15 1436  . insulin aspart (novoLOG) injection 0-5 Units  0-5 Units Subcutaneous QHS Lytle Butte, MD   0 Units at 06/08/15 0004  . insulin aspart (novoLOG) injection 0-9 Units  0-9 Units Subcutaneous TID WC Lytle Butte, MD   2 Units at 06/11/15 1704  . ipratropium-albuterol (DUONEB) 0.5-2.5 (3) MG/3ML nebulizer solution 3 mL  3 mL Nebulization Q4H PRN Wilhelmina Mcardle, MD   3 mL at 06/11/15 1920  . metoprolol succinate (TOPROL-XL) 24 hr tablet 100 mg  100 mg Oral Daily Lytle Butte, MD   100 mg at 06/11/15 0921  . multivitamin-lutein (OCUVITE-LUTEIN) capsule 1 capsule  1 capsule Oral Daily Lytle Butte, MD   1 capsule at 06/11/15 1497  . nitroGLYCERIN (NITROSTAT) SL tablet 0.4 mg  0.4 mg Sublingual Q5 min PRN Lytle Butte, MD      .  ondansetron (ZOFRAN) tablet 4 mg  4 mg Oral Q6H PRN Lytle Butte, MD       Or  . ondansetron [facility]) injection 4 mg  4 mg Intravenous Q6H PRN Lytle Butte, MD      . senna [facility]) tablet 8.6 mg  1 tablet Oral Daily PRN Lytle Butte, MD   8.6 mg at 06/08/15 1123  . sertraline (ZOLOFT) tablet 25 mg  25 mg Oral Daily Lytle Butte, MD   25 mg at 06/11/15 3748  . sodium chloride 0.9 % injection 3 mL  3 mL Intravenous Q12H Lytle Butte, MD   3 mL at 06/11/15 0924  . vitamin B-12 (CYANOCOBALAMIN) tablet 500 mcg  500 mcg Oral Daily Lytle Butte, MD   500 mcg at 06/11/15 2707  . vitamin C (ASCORBIC ACID) tablet 500 mg  500 mg Oral Daily Lytle Butte, MD   500 mg at 06/11/15 8675    Vital Signs: BP 117/57 mmHg  Pulse 88  Temp(Src) 97.9 F (36.6 C) (Oral)  Resp 18  Ht $R'5\' 5"'KX$  (1.651 m)  Wt 73.483 kg (162 lb)  BMI 26.96 kg/m2  SpO2 99% Filed Weights   06/07/15 1914  Weight: 73.483 kg (162 lb)    Estimated body mass index is 26.96 kg/(m^2) as calculated from the following:   Height as of this encounter: $RemoveBeforeD'5\' 5"'PqxfVqYAOYXkgc$  (1.651 m).   Weight as of this encounter: 73.483 kg (162 lb).  PERFORMANCE STATUS (ECOG) : 3 - Symptomatic, >50% confined to bed  PHYSICAL EXAM: On BIPAP  Sleeping Hrt rrr no mgr Lungs with clear sounds anteriorly Abd soft and NT Skin warm an dry  LABS: CBC:    Component Value Date/Time   WBC 3.4* 06/11/2015 0448   WBC 7.6 12/09/2014 1325   HGB 7.9* 06/11/2015 0448   HGB 11.7* 12/09/2014 1325   HCT 24.1* 06/11/2015 0448   HCT 36.1 12/09/2014 1325   PLT 217 06/11/2015 0448   PLT 226 12/09/2014 1325   MCV 80.2 06/11/2015 0448   MCV 78* 12/09/2014 1325   NEUTROABS 2.3 06/11/2015 0448   NEUTROABS 6.0 12/09/2014 1325   LYMPHSABS 0.6* 06/11/2015 0448   LYMPHSABS 0.9* 12/09/2014 1325   MONOABS 0.3 06/11/2015 0448   MONOABS 0.4 12/09/2014 1325   EOSABS 0.2 06/11/2015 0448   EOSABS 0.2 12/09/2014 1325   BASOSABS 0.0 06/11/2015 0448   BASOSABS 0.1 12/09/2014 1325   Comprehensive Metabolic Panel:    Component Value Date/Time   NA 142 06/11/2015 0448   NA 134* 12/09/2014 1325   K 4.6 06/11/2015 0448   K 4.5 12/09/2014 1325   CL 105 06/11/2015 0448   CL 105 12/09/2014 1325   CO2 33* 06/11/2015 0448   CO2 23 12/09/2014 1325   BUN 36* 06/11/2015 0448   BUN 31* 12/09/2014 1325    CREATININE 1.05* 06/11/2015 0448   CREATININE 1.27* 12/09/2014 1325   GLUCOSE 108* 06/11/2015 0448   GLUCOSE 181* 12/09/2014 1325   CALCIUM 8.7* 06/11/2015 0448   CALCIUM 8.7* 12/09/2014 1325   AST 15 06/07/2015 1939   AST 25 12/09/2014 1325   ALT 14 06/07/2015 1939   ALT 16 12/09/2014 1325   ALKPHOS 60 06/07/2015 1939   ALKPHOS 44 12/09/2014 1325   BILITOT 0.7 06/07/2015 1939   BILITOT 0.6 12/09/2014 1325   PROT 6.9 06/07/2015 1939   PROT 6.8 12/09/2014 1325   ALBUMIN 3.5 06/07/2015 1939   ALBUMIN 3.9 12/09/2014 1325  Time Spent:  80 minutes

## 2015-06-11 NOTE — Progress Notes (Signed)
PROGRESS NOTE  Regina Burke YCX:[id number] DOB: October 29, 1932 DOA: 06/07/2015 PCP: Tracie Harrier, MD  Subjective 81 y/o f with hx of Chronic A-fib, Ca  Lung, Chronic  Hypoxemic and Hypercapnic Respiratory Failure, HTN, OSA, Type 2 DM admitted with  Hyperkalemia , acute Renal failure , A-fib with RVR CT Chest Bilat Pleural effusions- Rt worse than left with compressive atelectasis and Rt upper lobe nodule with radiation fibrosis.Underwent Thoracentesis of Rt pleural space. State she is breathing better this morning    Objective: BP 129/42 mmHg  Pulse 75  Temp(Src) 97.5 F (36.4 C) (Oral)  Resp 16  Ht '5\' 5"'$  (1.651 m)  Wt 73.483 kg (162 lb)  BMI 26.96 kg/m2  SpO2 100%  Intake/Output Summary (Last 24 hours) at 06/11/15 0806 Last data filed at 06/11/15 0600  Gross per 24 hour  Intake    615 ml  Output    850 ml  Net   -235 ml   Filed Weights   06/07/15 1914  Weight: 73.483 kg (162 lb)    Exam:  General: Pale looking. Dyspneic art rest  HEENT: PERRL; OP moist without lesions. Neck: supple, trachea midline, no thyromegaly Chest: normal to palpation Lungs: Decreased air entry bilat  Cardiovascular:Irregular rate  Abdomen: soft, nontender, nondistended, positive bowel sounds Extremities: no clubbing, cyanosis, edema Neuro: alert and oriented, moves all extremities Derm: Pressure ulcer left butttock Lymph: no cervical or supraclavicular lymphadenopathy   Labs and imaging studies were reviewed*  Data Reviewed: Basic Metabolic Panel:  Recent Labs Lab 06/08/15 1003 06/08/15 1644 06/09/15 0418 06/10/15 0512 06/11/15 0448  NA 141 140 146* 142 142  K 5.9* 5.5* 5.2* 4.1 4.6  CL 104 106 106 103 105  CO2 29 30 32 32 33*  GLUCOSE 137* 134* 92 121* 108*  BUN 70* 66* 63* 46* 36*  CREATININE 1.83* 1.69* 1.70* 1.20* 1.05*  CALCIUM 9.0 8.5* 9.1 8.8* 8.7*   Liver Function Tests:  Recent Labs Lab 06/07/15 1939  AST 15  ALT 14  ALKPHOS 60  BILITOT 0.7  PROT  6.9  ALBUMIN 3.5   No results for input(s): LIPASE, AMYLASE in the last 168 hours. No results for input(s): AMMONIA in the last 168 hours. CBC:  Recent Labs Lab 06/07/15 1939 06/08/15 0405 06/09/15 0418 06/10/15 0512 06/11/15 0448  WBC 6.4 6.0 3.6 3.9 3.4*  NEUTROABS 5.5  --  2.5 2.8 2.3  HGB 8.3* 7.5* 6.7* 8.0* 7.9*  HCT 25.2* 23.0* 21.0* 24.6* 24.1*  MCV 80.3 80.0 80.3 80.9 80.2  PLT 217 199 199 219 217   Cardiac Enzymes:    Recent Labs Lab 06/07/15 1939  TROPONINI 0.03   BNP (last 3 results)  Recent Labs  12/23/14 0434 05/15/15 1233 06/07/15 1939  BNP 213.8* 560.0* 941.0*    ProBNP (last 3 results) No results for input(s): PROBNP in the last 8760 hours.  CBG:  Recent Labs Lab 06/10/15 0727 06/10/15 1127 06/10/15 1608 06/10/15 2144 06/11/15 0740  GLUCAP 108* 144* 109* 140* 108*    Recent Results (from the past 240 hour(s))  MRSA PCR Screening     Status: None   Collection Time: 06/08/15 12:30 AM  Result Value Ref Range Status   MRSA by PCR NEGATIVE NEGATIVE Final    Comment:        The GeneXpert MRSA Assay (FDA approved for NASAL specimens only), is one component of a comprehensive MRSA colonization surveillance program. It is not intended to diagnose MRSA infection nor to guide or  monitor treatment for MRSA infections.      Studies: Dg Chest 1 View  06/10/2015  CLINICAL DATA:  Status post right thoracentesis EXAM: CHEST  1 VIEW COMPARISON:  06/09/2015 FINDINGS: There is been near complete resolution of the right-sided pleural effusion following thoracentesis. Adequate re-expansion of the lung is noted with no evidence of pneumothorax. Persistent left-sided pleural effusion is seen. Cardiac shadow is stable. A right-sided PICC line is stable. IMPRESSION: No pneumothorax following right thoracentesis. If clinically desired and the left pleural effusion could be drained on 06/11/2015 Electronically Signed   By: Inez Catalina M.D.   On:  06/10/2015 16:00   US Thoracentesis Asp Pleural Space W/img Guide  06/10/2015  CLINICAL DATA:  Bilateral pleural effusions EXAM: ULTRASOUND GUIDED RIGHT THORACENTESIS PROCEDURE: An ultrasound guided thoracentesis was thoroughly discussed with the patient and questions answered. The benefits, risks, alternatives and complications were also discussed. The patient understands and wishes to proceed with the procedure. Written consent was obtained. Ultrasound was performed to localize and mark an adequate pocket of fluid in the right chest. The area was then prepped and draped in the normal sterile fashion. 1% Lidocaine was used for local anesthesia. Under ultrasound guidance a 6 French thoracentesis catheter was introduced. Thoracentesis was performed. The catheter was removed and a dressing applied. COMPLICATIONS: None FINDINGS: A total of approximately 1.1 L of bloody fluid was removed. A fluid sample was sent for laboratory analysis. IMPRESSION: Successful ultrasound guided right thoracentesis yielding 1.1 L of bloody pleural fluid. Patient has a moderate to large effusion on the left which could be drained on 06/11/2015 if clinically desired. Electronically Signed   By: Inez Catalina M.D.   On: 06/10/2015 16:32    Scheduled Meds: . antiseptic oral rinse  7 mL Mouth Rinse q12n4p  . aspirin EC  81 mg Oral Daily  . atorvastatin  10 mg Oral QHS  . budesonide  0.25 mg Nebulization 4 times per day  . chlorhexidine  15 mL Mouth Rinse BID  . cholecalciferol  400 Units Oral Daily  . clopidogrel  75 mg Oral Daily  . diltiazem  30 mg Oral 4 times per day  . diltiazem  240 mg Oral Daily  . diphenhydrAMINE  12.5 mg Intravenous Once  . famotidine  20 mg Oral Daily  . febuxostat  40 mg Oral Daily  . ferrous sulfate  325 mg Oral Daily  . heparin  5,000 Units Subcutaneous 3 times per day  . insulin aspart  0-5 Units Subcutaneous QHS  . insulin aspart  0-9 Units Subcutaneous TID WC  . ipratropium-albuterol  3  mL Nebulization Q6H  . metoprolol succinate  100 mg Oral Daily  . multivitamin-lutein  1 capsule Oral Daily  . sertraline  25 mg Oral Daily  . sodium chloride  3 mL Intravenous Q12H  . vitamin B-12  500 mcg Oral Daily  . vitamin C  500 mg Oral Daily    Continuous Infusions: . sodium chloride 10 mL/hr at 06/09/15 1503    Assessment/Plan:   1 Atrial fibrillation, with RVR:;  Rate is better controlled-Pt has been transitioned to PO Cardizem  2  Hypercapnic and Hypoxemic respiratory failure- On BIPAP and supplemental O2  3 Acute kidney injury ([place]); Improving- Se Creat;1.05  4 Hyperkalemia; Improved with KayexalateContinue to monitor  5  Bilat pleural effusions -Rt worse than left -Blood stained pleural fluid-  Unclear if due to CHF or malignancy  Awaiting results of cytology of pleural fluid  6 Pressure ulcer Left buttock; Watch for now   7 Anemia; Hgb  7.9 following  Transfusion   1 unit PRBC- Continue to monitor  8 Type 2 DM; On SSI Watch respiratory status.closely May be able to transfer to floor with tele later today   Code Status: Full code  Family Communication: son and daughter        06/11/2015, 8:06 AM  LOS: 4 days

## 2015-06-11 NOTE — Progress Notes (Signed)
PULMONARY/CCM NOTE  Date of admission: 06/07/15 Date of consult: 10/11 Reason for consultation: resp distress  Much improved after thoracentesis yesterday. Now back in NSR. No new complaints  Filed Vitals:   06/11/15 0742 06/11/15 0800 06/11/15 0900 06/11/15 1200  BP:  147/55 147/67 152/65  Pulse:  81 87 96  Temp:  98.3 F (36.8 C)  98.2 F (36.8 C)  TempSrc:  Oral  Oral  Resp:  $Remo'24 21 18  'KrGXs$ Height:      Weight:      SpO2: 100% 98% 99% 98%    EXAM:  Gen: NAD Lungs: diminished BS throughout, no wheezes Cardiovascular: Reg, no M Abdomen: soft, NT, +BS Ext: no C/C/E Neuro: no focal deficits  DATA:  BMP Latest Ref Rng 06/11/2015 06/10/2015 06/09/2015  Glucose 65 - 99 mg/dL 108(H) 121(H) 92  BUN 6 - 20 mg/dL 36(H) 46(H) 63(H)  Creatinine 0.44 - 1.00 mg/dL 1.05(H) 1.20(H) 1.70(H)  Sodium 135 - 145 mmol/L 142 142 146(H)  Potassium 3.5 - 5.1 mmol/L 4.6 4.1 5.2(H)  Chloride 101 - 111 mmol/L 105 103 106  CO2 22 - 32 mmol/L 33(H) 32 32  Calcium 8.9 - 10.3 mg/dL 8.7(L) 8.8(L) 9.1    CBC Latest Ref Rng 06/11/2015 06/10/2015 06/09/2015  WBC 3.6 - 11.0 K/uL 3.4(L) 3.9 3.6  Hemoglobin 12.0 - 16.0 g/dL 7.9(L) 8.0(L) 6.7(L)  Hematocrit 35.0 - 47.0 % 24.1(L) 24.6(L) 21.0(L)  Platelets 150 - 440 K/uL 217 219 199  PCT 0.17 ESR 107 mmHg  CXR: Markedly improved aeration on R  IMPRESSION:   Acute on chronic respiratory failure Suspect COPD without overt bronchospasm Adenocarcinoma of lung, s/p XRT - minimal radiation fibrosis in area of tumor Bilateral effusions, moderate in size  S/P thoracentesis 10/13 - 1.1 liters removed  Described as "bloody". Chemistries c/w exudate Anemia - PRBCs transfused 10/12 AFRVR > NSR now  PLAN/REC:  DC scheduled nebulized steroids Change bronchodilators to PRN only Follow up on pleural fluid studies, esp cytology! Mgmt of AF per Cards and primary team Recommend repeat CXR just prior to discharge and again in 2-3 weeks after discharge to monitor  course of pleural effusions. If they recur, pulmonary medicine follow up and further eval might be warranted  PCCM will sign off. Please call if we can be of further assistance  Merton Border, MD PCCM service Mobile [phone] Pager [phone]

## 2015-06-11 NOTE — Care Management (Signed)
Patient transferred out of icu this day.  Physical therapy  Evaluated and recommends skilled nursing.  CSW aware and involved.

## 2015-06-11 NOTE — Progress Notes (Signed)
Regina Burke   DOB:19-Oct-1932   EX#:[id number]    Subjective: Patient complains of ongoing shortness of breath; no cough. Mental status changes improved. She continues to feel weak. She has been transferred from the ICU to stepdown unit.  Review of system: Appetite is poor. No nausea no vomiting. No blood in stools black stools.  Objective:  Filed Vitals:   06/11/15 1200  BP: 152/65  Pulse: 96  Temp: 98.2 F (36.8 C)  Resp: 18     Intake/Output Summary (Last 24 hours) at 06/11/15 1255 Last data filed at 06/11/15 0900  Gross per 24 hour  Intake    535 ml  Output    500 ml  Net     35 ml    GENERAL:alert, mild respiratory distress; on 3 L nasal cannula. SKIN: Positive for pallor OROPHARYNX: no thrush NECK: supple, thyroid normal size, non-tender, without nodularity LYMPH:  no palpable lymphadenopathy in the cervical, axillary or inguinal LUNGS: positive for crackles; decreased breath sounds bilaterally.  HEART: regular rate & rhythm and no murmurs and no lower extremity edema ABDOMEN:abdomen soft, non-tender and normal bowel sounds Musculoskeletal:no cyanosis of digits and no clubbing  NEURO: alert & oriented x 3 with fluent speech, no focal motor/sensory deficits   Labs:  Lab Results  Component Value Date   WBC 3.4* 06/11/2015   HGB 7.9* 06/11/2015   HCT 24.1* 06/11/2015   MCV 80.2 06/11/2015   PLT 217 06/11/2015   NEUTROABS 2.3 06/11/2015    Lab Results  Component Value Date   NA 142 06/11/2015   K 4.6 06/11/2015   CL 105 06/11/2015   CO2 33* 06/11/2015    Studies:  Dg Chest 1 View  06/10/2015  CLINICAL DATA:  Status post right thoracentesis EXAM: CHEST  1 VIEW COMPARISON:  06/09/2015 FINDINGS: There is been near complete resolution of the right-sided pleural effusion following thoracentesis. Adequate re-expansion of the lung is noted with no evidence of pneumothorax. Persistent left-sided pleural effusion is seen. Cardiac shadow is stable. A right-sided  PICC line is stable. IMPRESSION: No pneumothorax following right thoracentesis. If clinically desired and the left pleural effusion could be drained on 06/11/2015 Electronically Signed   By: Inez Catalina M.D.   On: 06/10/2015 16:00   Dg Chest Port 1 View  06/09/2015  CLINICAL DATA:  Status post central line placement. EXAM: PORTABLE CHEST 1 VIEW COMPARISON:  Same day. FINDINGS: Stable cardiomegaly. No pneumothorax is noted. Stable bilateral pleural effusions are noted with probable underlying atelectasis. Bony thorax is unremarkable. Right-sided PICC line has been repositioned, with distal tip in expected position of the SVC. IMPRESSION: Stable cardiomegaly and bilateral pleural effusions are noted with probable underlying atelectasis. Right-sided PICC line has been repositioned with distal tip in expected position of SVC. Electronically Signed   By: Marijo Conception, M.D.   On: 06/09/2015 16:25   Dg Chest Port 1 View  06/09/2015  CLINICAL DATA:  PICC line placement EXAM: PORTABLE CHEST 1 VIEW COMPARISON:  06/07/2015 FINDINGS: Right PICC line is in place. The tip is in the upper right atrium approximately 3 cm deep to the cavoatrial junction. Cardiomegaly. Bilateral perihilar and lower lobe opacities with layering effusions are similar to prior study. IMPRESSION: Right PICC line tip in the upper right atrium 3 cm deep to the cavoatrial junction. No change in the appearance of the chest otherwise. Electronically Signed   By: Rolm Baptise M.D.   On: 06/09/2015 16:20   US Thoracentesis Asp Pleural  Space W/img Guide  06/10/2015  CLINICAL DATA:  Bilateral pleural effusions EXAM: ULTRASOUND GUIDED RIGHT THORACENTESIS PROCEDURE: An ultrasound guided thoracentesis was thoroughly discussed with the patient and questions answered. The benefits, risks, alternatives and complications were also discussed. The patient understands and wishes to proceed with the procedure. Written consent was obtained. Ultrasound was  performed to localize and mark an adequate pocket of fluid in the right chest. The area was then prepped and draped in the normal sterile fashion. 1% Lidocaine was used for local anesthesia. Under ultrasound guidance a 6 French thoracentesis catheter was introduced. Thoracentesis was performed. The catheter was removed and a dressing applied. COMPLICATIONS: None FINDINGS: A total of approximately 1.1 L of bloody fluid was removed. A fluid sample was sent for laboratory analysis. IMPRESSION: Successful ultrasound guided right thoracentesis yielding 1.1 L of bloody pleural fluid. Patient has a moderate to large effusion on the left which could be drained on 06/11/2015 if clinically desired. Electronically Signed   By: Inez Catalina M.D.   On: 06/10/2015 16:32    Assessment & Plan:   81 year old female patient with history of early stage lung cancer [adeno ca with lepidic pattern] status post radiation [2015] currently admitted to the hospital for worsening respiratory status/A. fib with RVR.  # Bilateral pleural effusions right more than left. Etiology is unclear-clinically less likely to be recurrent. Status post thoracentesis on the right side. Awaiting on cytology.  # Severe anemia-  The etiology is unclear no obvious GI bleed noted. Could be from the critical illness/chronic kidney disease. No obvious source of hemolysis noted. Status post transfusion hemoglobin today is 7.9. Transfuse as needed.  The above plan of care was discussed with the patient. Given the mild increase in shortness of breath; discussed with the nurse.   Cammie Sickle, MD 06/11/2015  12:55 PM

## 2015-06-12 ENCOUNTER — Inpatient Hospital Stay: Payer: Medicare Other

## 2015-06-12 LAB — BASIC METABOLIC PANEL
Anion gap: 5 (ref 5–15)
BUN: 33 mg/dL — AB (ref 6–20)
CALCIUM: 8 mg/dL — AB (ref 8.9–10.3)
CHLORIDE: 103 mmol/L (ref 101–111)
CO2: 32 mmol/L (ref 22–32)
CREATININE: 1.06 mg/dL — AB (ref 0.44–1.00)
GFR calc non Af Amer: 49 mL/min — ABNORMAL LOW (ref >60)
GFR, EST AFRICAN AMERICAN: 56 mL/min — AB (ref >60)
Glucose, Bld: 123 mg/dL — ABNORMAL HIGH (ref 65–99)
Potassium: 5 mmol/L (ref 3.5–5.1)
SODIUM: 140 mmol/L (ref 135–145)

## 2015-06-12 LAB — GLUCOSE, CAPILLARY
GLUCOSE-CAPILLARY: 152 mg/dL — AB (ref 65–99)
GLUCOSE-CAPILLARY: 136 mg/dL — AB (ref 65–99)
Glucose-Capillary: 134 mg/dL — ABNORMAL HIGH (ref 65–99)
Glucose-Capillary: 127 mg/dL — ABNORMAL HIGH (ref 65–99)

## 2015-06-12 LAB — CBC WITH DIFFERENTIAL/PLATELET
BASOS PCT: 1 %
Basophils Absolute: 0 10*3/uL (ref 0–0.1)
EOS ABS: 0.2 10*3/uL (ref 0–0.7)
Eosinophils Relative: 5 %
HCT: 25.9 % — ABNORMAL LOW (ref 35.0–47.0)
HEMOGLOBIN: 8.4 g/dL — AB (ref 12.0–16.0)
LYMPHS ABS: 0.7 10*3/uL — AB (ref 1.0–3.6)
Lymphocytes Relative: 18 %
MCH: 25.5 pg — AB (ref 26.0–34.0)
MCHC: 32.3 g/dL (ref 32.0–36.0)
MCV: 78.8 fL — ABNORMAL LOW (ref 80.0–100.0)
MONO ABS: 0.4 10*3/uL (ref 0.2–0.9)
MONOS PCT: 11 %
Neutro Abs: 2.4 10*3/uL (ref 1.4–6.5)
Neutrophils Relative %: 65 %
Platelets: 226 10*3/uL (ref 150–440)
RBC: 3.28 MIL/uL — ABNORMAL LOW (ref 3.80–5.20)
RDW: 16.2 % — AB (ref 11.5–14.5)
WBC: 3.6 10*3/uL (ref 3.6–11.0)

## 2015-06-12 NOTE — Progress Notes (Signed)
Per Ultrasound tech, thoracentesis will be scheduled for Monday unless situation becomes emergent, then MD would need to call Radiology. Will continue to monitor. Family was previously aware that procedure would likely be Monday.

## 2015-06-12 NOTE — Progress Notes (Signed)
SUBJECTIVE:Still c/o SOB.   BP 137/43 mmHg  Pulse 86  Temp(Src) 97.8 F (36.6 C) (Oral)  Resp 20  Ht '5\' 5"'$  (1.651 m)  Wt 162 lb (73.483 kg)  BMI 26.96 kg/m2  SpO2 98%  Intake/Output Summary (Last 24 hours) at 06/12/15 1121 Last data filed at 06/12/15 0900  Gross per 24 hour  Intake    240 ml  Output   1250 ml  Net  -1010 ml    PHYSICAL EXAM General: Well developed, well nourished, in no acute distress. Alert and oriented x 3.  Psych:  Good affect, responds appropriately Neck: No JVD. No masses noted.  Lungs: Decreased BS left base.   Heart: RRR with no murmurs noted. Abdomen: Bowel sounds are present. Soft, non-tender.  Extremities: No lower extremity edema.   LABS: Basic Metabolic Panel:  Recent Labs  06/11/15 0448 06/12/15 0553  NA 142 140  K 4.6 5.0  CL 105 103  CO2 33* 32  GLUCOSE 108* 123*  BUN 36* 33*  CREATININE 1.05* 1.06*  CALCIUM 8.7* 8.0*   CBC:  Recent Labs  06/11/15 0448 06/12/15 0553  WBC 3.4* 3.6  NEUTROABS 2.3 2.4  HGB 7.9* 8.4*  HCT 24.1* 25.9*  MCV 80.2 78.8*  PLT 217 226   Current Meds: . antiseptic oral rinse  7 mL Mouth Rinse q12n4p  . aspirin EC  81 mg Oral Daily  . atorvastatin  10 mg Oral QHS  . chlorhexidine  15 mL Mouth Rinse BID  . cholecalciferol  400 Units Oral Daily  . clopidogrel  75 mg Oral Daily  . diltiazem  300 mg Oral Daily  . diphenhydrAMINE  12.5 mg Intravenous Once  . famotidine  20 mg Oral Daily  . febuxostat  40 mg Oral Daily  . ferrous sulfate  325 mg Oral Daily  . heparin  5,000 Units Subcutaneous 3 times per day  . insulin aspart  0-5 Units Subcutaneous QHS  . insulin aspart  0-9 Units Subcutaneous TID WC  . metoprolol succinate  100 mg Oral Daily  . multivitamin-lutein  1 capsule Oral Daily  . sertraline  25 mg Oral Daily  . sodium chloride  3 mL Intravenous Q12H  . vitamin B-12  500 mcg Oral Daily  . vitamin C  500 mg Oral Daily     ASSESSMENT AND PLAN: 81 y.o. female with past  medical history including coronary artery disease, paroxysmal atrial fibrillation, COPD, lung cancer, diabetes mellitus, and admitted to [facility] hospital on 12/22/2014 with chest pain, elective cardiac catheterization found to have mild 50% disease in the diagonal branch of the LAD and high-grade 90% hazy stenosis in the marginal branch of the circumflex treated with Promus DES ostium OM3. Hospital admission in August 2016 for electrolyte abnormality, digoxin toxicity, readmitted 06/07/2015 with shortness of breath, found to have atrial fibrillation , potassium of 7, renal failure. She has been anemic this admission and has received pRBCs.    1. Atrial fibrillation: Converted to normal sinus rhythm after thoracentesis. Cardizem increased to 300 mg daily today. She is also on metoprolol. This appears to be new. She is not being anti-coagulated given anemia. CHADS VASC score 5 so her risk of CVA is high. Will have to consider long term anticoagulation pending workup of anemia.   2. Acute respiratory distress: Likely multifactorial including profound anemia, atrial fibrillation, severe COPD, pleural effusions. Management of COPD per pulmonary. She is s/p right thoracentesis. Still with large left pleural effusion.  Planning per pulmonary team.   3. Digoxin toxicity: will not restart given this is the second time with dig level problems  4. Anemia: H/H stable. No clear source of anemia. Workup per primary team.   5. CAD: stable. She is on ASA, Plavix, statin and beta blocker.     Katoria Yetman  10/15/201611:21 AM

## 2015-06-12 NOTE — Progress Notes (Signed)
Regina Burke is a 81 y.o. female  <principal problem not specified>   SUBJECTIVE:  Pt in NAD. Remains SOB and weak. Stable on O2. Currently in NSR. Denies Cp.  ______________________________________________________________________  ROS: Review of systems is unremarkable for any active cardiac,respiratory, GI, GU, hematologic, neurologic or psychiatric systems, 10 systems reviewed.  '@CMEDLIST'$ @  Past Medical History  Diagnosis Date  . COPD (chronic obstructive pulmonary disease) ([place])   . CHF (congestive heart failure) ([place])   . Hypertension   . Rapid palpitations 2014    Seen at [facility], may have been atrial fibrillation  . DOE (dyspnea on exertion)     Started after treatment for lung cancer  . High cholesterol   . Heart murmur   . Myocardial infarction ([place]) 12/22/2014   . Lung cancer ([place]) dx'd 2014    S/P radiation 2015  . Sleep apnea   . Cushing's disease ([place])   . Type II diabetes mellitus ([place])   . Anemia   . GERD (gastroesophageal reflux disease)   . History of hiatal hernia   . Migraine   . Arthritis   . Gout   . Depression   . Chronic kidney disease (CKD), stage III (moderate)     Past Surgical History  Procedure Laterality Date  . Adrenalectomy Left 1980's    "[place]"  . Abdominal hysterectomy    . Appendectomy    . Cholecystectomy    . Coronary angioplasty with stent placement  12/23/2014  . Tonsillectomy    . Fracture surgery    . Wrist fracture surgery Bilateral ~ 2000  . Breast cyst excision Left   . Tubal ligation    . Left heart catheterization with coronary angiogram N/A 12/23/2014    Procedure: LEFT HEART CATHETERIZATION WITH CORONARY ANGIOGRAM;  Surgeon: Burnell Blanks, MD;  Location: [facility] CATH LAB;  Service: Cardiovascular;  Laterality: N/A;  . Percutaneous coronary stent intervention (pci-s)  12/23/2014    Procedure: PERCUTANEOUS CORONARY STENT INTERVENTION (PCI-S);  Surgeon: Burnell Blanks, MD;  Location: [facility] CATH LAB;   Service: Cardiovascular;;  Promus 2.25x8    PHYSICAL EXAM:  BP 138/49 mmHg  Pulse 86  Temp(Src) 98.3 F (36.8 C) (Oral)  Resp 18  Ht '5\' 5"'$  (1.651 m)  Wt 73.483 kg (162 lb)  BMI 26.96 kg/m2  SpO2 99%  Wt Readings from Last 3 Encounters:  06/07/15 73.483 kg (162 lb)  05/16/15 74.571 kg (164 lb 6.4 oz)  05/08/15 68.04 kg (150 lb)            Constitutional: NAD Neck: supple, no thyromegaly Respiratory: scattered rhonchi with decreased breath sounds at left base. Cardiovascular: RRR with occasional premature beat., no murmur, no gallop Abdomen: soft, good BS, nontender Extremities: no edema Neuro: alert and oriented, no focal motor or sensory deficits  ASSESSMENT/PLAN:  Labs and imaging studies were reviewed  CXR today. Consider thoracentesis of left pleural effusion. Repeat labs in AM. Continue supportive care. Await f/u from consultants.

## 2015-06-12 NOTE — Progress Notes (Signed)
Daughter, [name], called RN wanting an update. She was concerned that patient was not getting her xanax regularly. She was informed that RN has been rounding on patient frequently and patient has not showed signs nor complained of anxiety. Patient rested quietly most of the day; had some visitors and was able to nap today with the Bipap on. RN rounded immediately following phone call. Patient was resting quietly; did say she was ready to put the Bipap back on and rest a little (RT was notified and said they will be here shortly). Patient was asked specifically about needing a xanax at this time. She declined and said she would rather wait a little later, maybe after evening meds closer to bedtime.  Toniann Ket

## 2015-06-12 NOTE — Progress Notes (Signed)
A&O. Anxious at times. Wore bipap most of the night then wanted it taken off. 3L placed via [place] as prior. Slept well through the night. Foley in place.

## 2015-06-12 NOTE — Progress Notes (Signed)
Pt asked to come off of Bipap and go on [place]. Pt placed on 3L N. Tolerating well

## 2015-06-12 NOTE — Progress Notes (Signed)
Initial Nutrition Assessment  DOCUMENTATION CODES:      INTERVENTION:  Meals and snacks: Cater to pt preferences Medical Nutrition Supplement therapy: will add mightyshake BID for added nutrition   NUTRITION DIAGNOSIS:   Inadequate oral intake related to acute illness as evidenced by meal completion < 25%.    GOAL:   Patient will meet greater than or equal to 90% of their needs    MONITOR:    (Energy intake, Electrolyte and renal profile, Pulmonary profile)  REASON FOR ASSESSMENT:   LOS    ASSESSMENT:      Pt admitted with afib, acute respiratory distress currently on bipap, left pleural effusion  Past Medical History  Diagnosis Date  . COPD (chronic obstructive pulmonary disease) ([place])   . CHF (congestive heart failure) ([place])   . Hypertension   . Rapid palpitations 2014    Seen at [facility], may have been atrial fibrillation  . DOE (dyspnea on exertion)     Started after treatment for lung cancer  . High cholesterol   . Heart murmur   . Myocardial infarction ([place]) 12/22/2014   . Lung cancer ([place]) dx'd 2014    S/P radiation 2015  . Sleep apnea   . Cushing's disease ([place])   . Type II diabetes mellitus ([place])   . Anemia   . GERD (gastroesophageal reflux disease)   . History of hiatal hernia   . Migraine   . Arthritis   . Gout   . Depression   . Chronic kidney disease (CKD), stage III (moderate)     Current Nutrition: no lunch eaten during visit on bipap. Per I and O sheet intake on average 60% of meals consumed.  Food/Nutrition-Related History: Pt reports appetite has not been that good for "awhile"   Medications: vit d, fe sulfate, aspart  Electrolyte/Renal Profile and Glucose Profile:   Recent Labs Lab 06/10/15 0512 06/11/15 0448 06/12/15 0553  NA 142 142 140  K 4.1 4.6 5.0  CL 103 105 103  CO2 32 33* 32  BUN 46* 36* 33*  CREATININE 1.20* 1.05* 1.06*  CALCIUM 8.8* 8.7* 8.0*  GLUCOSE 121* 108* 123*   Protein Profile:  Recent Labs Lab  06/07/15 1939  ALBUMIN 3.5    Gastrointestinal Profile: Last BM:10/11   Nutrition-Focused Physical Exam Findings:  Unable to complete Nutrition-Focused physical exam at this time.     Weight Change: wt encounters reviewed and wt stable    Diet Order:  Diet Heart Room service appropriate?: Yes; Fluid consistency:: Thin  Skin:   deep tissue injury noted on buttocks      Height:   Ht Readings from Last 1 Encounters:  06/07/15 '5\' 5"'$  (1.651 m)    Weight:   Wt Readings from Last 1 Encounters:  06/07/15 162 lb (73.483 kg)    Ideal Body Weight:     BMI:  Body mass index is 26.96 kg/(m^2).  Estimated Nutritional Needs:   Kcal:  0092-3300 kcals (BEE 1010, 1.3 AF, 1.1-1.3 IF) using IBW of 55  Protein:  61-72 g (1.1-1.3 gkg)   Fluid:  25-64m/kg 1375-16541md  EDUCATION NEEDS:   No education needs identified at this time  MOBrantleyAlZenia ResidesRDChignik LagoonLDEdinburghpager)

## 2015-06-13 DIAGNOSIS — I480 Paroxysmal atrial fibrillation: Secondary | ICD-10-CM

## 2015-06-13 LAB — CBC WITH DIFFERENTIAL/PLATELET
BASOS ABS: 0 10*3/uL (ref 0–0.1)
Basophils Relative: 1 %
Eosinophils Absolute: 0.2 10*3/uL (ref 0–0.7)
Eosinophils Relative: 6 %
HCT: 25.2 % — ABNORMAL LOW (ref 35.0–47.0)
HEMOGLOBIN: 8.6 g/dL — AB (ref 12.0–16.0)
LYMPHS ABS: 0.8 10*3/uL — AB (ref 1.0–3.6)
Lymphocytes Relative: 19 %
MCH: 26.8 pg (ref 26.0–34.0)
MCHC: 34.2 g/dL (ref 32.0–36.0)
MCV: 78.4 fL — AB (ref 80.0–100.0)
MONOS PCT: 11 %
Monocytes Absolute: 0.4 10*3/uL (ref 0.2–0.9)
NEUTROS ABS: 2.5 10*3/uL (ref 1.4–6.5)
NEUTROS PCT: 63 %
PLATELETS: 217 10*3/uL (ref 150–440)
RBC: 3.21 MIL/uL — AB (ref 3.80–5.20)
RDW: 15.8 % — ABNORMAL HIGH (ref 11.5–14.5)
WBC: 4 10*3/uL (ref 3.6–11.0)

## 2015-06-13 LAB — BASIC METABOLIC PANEL
ANION GAP: 4 — AB (ref 5–15)
BUN: 36 mg/dL — ABNORMAL HIGH (ref 6–20)
CHLORIDE: 104 mmol/L (ref 101–111)
CO2: 33 mmol/L — ABNORMAL HIGH (ref 22–32)
Calcium: 8.8 mg/dL — ABNORMAL LOW (ref 8.9–10.3)
Creatinine, Ser: 1.19 mg/dL — ABNORMAL HIGH (ref 0.44–1.00)
GFR calc Af Amer: 49 mL/min — ABNORMAL LOW (ref >60)
GFR, EST NON AFRICAN AMERICAN: 42 mL/min — AB (ref >60)
GLUCOSE: 107 mg/dL — AB (ref 65–99)
POTASSIUM: 4.9 mmol/L (ref 3.5–5.1)
SODIUM: 141 mmol/L (ref 135–145)

## 2015-06-13 LAB — GLUCOSE, CAPILLARY
GLUCOSE-CAPILLARY: 100 mg/dL — AB (ref 65–99)
GLUCOSE-CAPILLARY: 188 mg/dL — AB (ref 65–99)
GLUCOSE-CAPILLARY: 105 mg/dL — AB (ref 65–99)
Glucose-Capillary: 105 mg/dL — ABNORMAL HIGH (ref 65–99)

## 2015-06-13 MED ORDER — SODIUM CHLORIDE 0.9 % IJ SOLN: 10.0000 mL | INTRAMUSCULAR | Status: DC | PRN

## 2015-06-13 MED ORDER — SODIUM CHLORIDE 0.9 % IJ SOLN
10.0000 mL | INTRAMUSCULAR | Status: AC | PRN
  Administered 2015-06-13: 10 mL

## 2015-06-13 NOTE — Progress Notes (Signed)
Pt offered prn xanax by RN, but pt declined, reports "I dont think i need it right now."

## 2015-06-13 NOTE — Progress Notes (Signed)
SUBJECTIVE: No chest pain. Mild SOB.   Tele: sinus  BP 134/69 mmHg  Pulse 78  Temp(Src) 98.4 F (36.9 C) (Oral)  Resp 18  Ht '5\' 5"'$  (1.651 m)  Wt 162 lb (73.483 kg)  BMI 26.96 kg/m2  SpO2 97%  Intake/Output Summary (Last 24 hours) at 06/13/15 1032 Last data filed at 06/13/15 0951  Gross per 24 hour  Intake    510 ml  Output   1700 ml  Net  -1190 ml    PHYSICAL EXAM General: Well developed, well nourished, in no acute distress. Alert and oriented x 3.  Psych:  Good affect, responds appropriately Neck: No JVD. No masses noted.  Lungs: Decreased BS bilaterally in the bases. No wheezes  Heart: RRR with no murmurs noted. Abdomen: Bowel sounds are present. Soft, non-tender.  Extremities: No lower extremity edema.   LABS: Basic Metabolic Panel:  Recent Labs  06/12/15 0553 06/13/15 0515  NA 140 141  K 5.0 4.9  CL 103 104  CO2 32 33*  GLUCOSE 123* 107*  BUN 33* 36*  CREATININE 1.06* 1.19*  CALCIUM 8.0* 8.8*   CBC:  Recent Labs  06/12/15 0553 06/13/15 0515  WBC 3.6 4.0  NEUTROABS 2.4 2.5  HGB 8.4* 8.6*  HCT 25.9* 25.2*  MCV 78.8* 78.4*  PLT 226 217    Current Meds: . antiseptic oral rinse  7 mL Mouth Rinse q12n4p  . aspirin EC  81 mg Oral Daily  . atorvastatin  10 mg Oral QHS  . chlorhexidine  15 mL Mouth Rinse BID  . cholecalciferol  400 Units Oral Daily  . clopidogrel  75 mg Oral Daily  . diltiazem  300 mg Oral Daily  . diphenhydrAMINE  12.5 mg Intravenous Once  . famotidine  20 mg Oral Daily  . febuxostat  40 mg Oral Daily  . ferrous sulfate  325 mg Oral Daily  . heparin  5,000 Units Subcutaneous 3 times per day  . insulin aspart  0-5 Units Subcutaneous QHS  . insulin aspart  0-9 Units Subcutaneous TID WC  . metoprolol succinate  100 mg Oral Daily  . multivitamin-lutein  1 capsule Oral Daily  . sertraline  25 mg Oral Daily  . sodium chloride  3 mL Intravenous Q12H  . vitamin B-12  500 mcg Oral Daily  . vitamin C  500 mg Oral Daily      ASSESSMENT AND PLAN: 81 y.o. female with past medical history including coronary artery disease, paroxysmal atrial fibrillation, COPD, lung cancer, diabetes mellitus, and admitted to [facility] hospital on 12/22/2014 with chest pain, elective cardiac catheterization found to have mild 50% disease in the diagonal branch of the LAD and high-grade 90% hazy stenosis in the marginal branch of the circumflex treated with Promus DES ostium OM3. Hospital admission in August 2016 for electrolyte abnormality, digoxin toxicity, readmitted 06/07/2015 with shortness of breath, found to have atrial fibrillation , potassium of 7, renal failure. She has been anemic this admission and has received pRBCs.   1. Atrial fibrillation, paroxysmal: Sinus this am. Continue Cardizem and metoprolol. This appears to be new. She is not being anti-coagulated given anemia. CHADS VASC score 5 so her risk of CVA is high. Will have to consider long term anticoagulation pending workup of anemia.   2. Acute respiratory distress: Likely multifactorial including profound anemia, atrial fibrillation, severe COPD, pleural effusions. Management of COPD per pulmonary. She is s/p right thoracentesis. Still with large left pleural effusion. Planning per  pulmonary team for u/s guided thoracentesis tomorrow of the left pleural effusion.   3. Digoxin toxicity: will not restart given this is the second time with dig level problems  4. Anemia: H/H stable. No clear source of anemia. Workup per primary team.   5. CAD: stable. She is on ASA, Plavix, statin and beta blocker.     Regina Burke  10/16/201610:32 AM

## 2015-06-13 NOTE — Progress Notes (Signed)
Pt requesting to be put on BiPAP for a little bit, RT notified and in the room reapplying.

## 2015-06-13 NOTE — Progress Notes (Signed)
Regina Burke is a 81 y.o. female  <principal problem not specified>   SUBJECTIVE:  Pt in NAD. Still with some SOB and dyspnea. Stable on O2. Cardiology input noted. Remains in NSR.  ______________________________________________________________________  ROS: Review of systems is unremarkable for any active cardiac,respiratory, GI, GU, hematologic, neurologic or psychiatric systems, 10 systems reviewed.  '@CMEDLIST'$ @  Past Medical History  Diagnosis Date  . COPD (chronic obstructive pulmonary disease) ([place])   . CHF (congestive heart failure) ([place])   . Hypertension   . Rapid palpitations 2014    Seen at [facility], may have been atrial fibrillation  . DOE (dyspnea on exertion)     Started after treatment for lung cancer  . High cholesterol   . Heart murmur   . Myocardial infarction ([place]) 12/22/2014   . Lung cancer ([place]) dx'd 2014    S/P radiation 2015  . Sleep apnea   . Cushing's disease ([place])   . Type II diabetes mellitus ([place])   . Anemia   . GERD (gastroesophageal reflux disease)   . History of hiatal hernia   . Migraine   . Arthritis   . Gout   . Depression   . Chronic kidney disease (CKD), stage III (moderate)     Past Surgical History  Procedure Laterality Date  . Adrenalectomy Left 1980's    "[place]"  . Abdominal hysterectomy    . Appendectomy    . Cholecystectomy    . Coronary angioplasty with stent placement  12/23/2014  . Tonsillectomy    . Fracture surgery    . Wrist fracture surgery Bilateral ~ 2000  . Breast cyst excision Left   . Tubal ligation    . Left heart catheterization with coronary angiogram N/A 12/23/2014    Procedure: LEFT HEART CATHETERIZATION WITH CORONARY ANGIOGRAM;  Surgeon: Burnell Blanks, MD;  Location: [facility] CATH LAB;  Service: Cardiovascular;  Laterality: N/A;  . Percutaneous coronary stent intervention (pci-s)  12/23/2014    Procedure: PERCUTANEOUS CORONARY STENT INTERVENTION (PCI-S);  Surgeon: Burnell Blanks, MD;  Location:  [facility] CATH LAB;  Service: Cardiovascular;;  Promus 2.25x8    PHYSICAL EXAM:  BP 134/69 mmHg  Pulse 78  Temp(Src) 98.4 F (36.9 C) (Oral)  Resp 18  Ht '5\' 5"'$  (1.651 m)  Wt 73.483 kg (162 lb)  BMI 26.96 kg/m2  SpO2 97%  Wt Readings from Last 3 Encounters:  06/07/15 73.483 kg (162 lb)  05/16/15 74.571 kg (164 lb 6.4 oz)  05/08/15 68.04 kg (150 lb)            Constitutional: NAD Neck: supple, no thyromegaly Respiratory: decreased breath sounds at left base Cardiovascular: RRR, no murmur, no gallop Abdomen: soft, good BS, nontender Extremities: no edema Neuro: alert and oriented, no focal motor or sensory deficits  ASSESSMENT/PLAN:  Labs and imaging studies were reviewed  No change in care. US guided thoracentesis of left pleural effusion tomorrow. Repeat labs in AM.

## 2015-06-14 ENCOUNTER — Inpatient Hospital Stay: Payer: Medicare Other

## 2015-06-14 LAB — BASIC METABOLIC PANEL
ANION GAP: 5 (ref 5–15)
BUN: 40 mg/dL — ABNORMAL HIGH (ref 6–20)
CALCIUM: 8.7 mg/dL — AB (ref 8.9–10.3)
CHLORIDE: 103 mmol/L (ref 101–111)
CO2: 32 mmol/L (ref 22–32)
CREATININE: 1.09 mg/dL — AB (ref 0.44–1.00)
GFR calc non Af Amer: 47 mL/min — ABNORMAL LOW (ref >60)
GFR, EST AFRICAN AMERICAN: 54 mL/min — AB (ref >60)
Glucose, Bld: 98 mg/dL (ref 65–99)
Potassium: 5.1 mmol/L (ref 3.5–5.1)
SODIUM: 140 mmol/L (ref 135–145)

## 2015-06-14 LAB — CBC WITH DIFFERENTIAL/PLATELET
BASOS ABS: 0 10*3/uL (ref 0–0.1)
BASOS PCT: 1 %
EOS ABS: 0.3 10*3/uL (ref 0–0.7)
Eosinophils Relative: 8 %
HEMATOCRIT: 25.7 % — AB (ref 35.0–47.0)
HEMOGLOBIN: 8.3 g/dL — AB (ref 12.0–16.0)
Lymphocytes Relative: 22 %
Lymphs Abs: 0.9 10*3/uL — ABNORMAL LOW (ref 1.0–3.6)
MCH: 25.7 pg — ABNORMAL LOW (ref 26.0–34.0)
MCHC: 32.5 g/dL (ref 32.0–36.0)
MCV: 78.9 fL — ABNORMAL LOW (ref 80.0–100.0)
MONOS PCT: 11 %
Monocytes Absolute: 0.4 10*3/uL (ref 0.2–0.9)
NEUTROS PCT: 58 %
Neutro Abs: 2.4 10*3/uL (ref 1.4–6.5)
Platelets: 226 10*3/uL (ref 150–440)
RBC: 3.25 MIL/uL — ABNORMAL LOW (ref 3.80–5.20)
RDW: 16.4 % — AB (ref 11.5–14.5)
WBC: 4.1 10*3/uL (ref 3.6–11.0)

## 2015-06-14 LAB — BODY FLUID CULTURE: Special Requests: NORMAL

## 2015-06-14 LAB — GLUCOSE, CAPILLARY
GLUCOSE-CAPILLARY: 111 mg/dL — AB (ref 65–99)
Glucose-Capillary: 118 mg/dL — ABNORMAL HIGH (ref 65–99)
Glucose-Capillary: 130 mg/dL — ABNORMAL HIGH (ref 65–99)
Glucose-Capillary: 112 mg/dL — ABNORMAL HIGH (ref 65–99)

## 2015-06-14 NOTE — Care Management (Signed)
CM has made several attempts to speak with patient but she is on bipapp.  Spoke with her primary caregiver- [name].  It has been reported to CM that "patient does not want to go to to skilled nursing.  She wants to go home."  At present, it is reported that patient can not even stand by herself.  [name] says that if patient discharges home, she will "need to be able to stand and and take care of her bipap by herself."  Patient does not have a cpap or bipap at home.  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  Will ask physical therapy to reassess and then CM will speak directly with patient.  Have paged attending to discuss need for bipap at discharge should the patient return home  and criteria that must be met.  Had thoracentesis 10/16 in which 800 cc bloody fluid removed.  This is the second thoracentesis since admission.  Malignancy has not been ruled out.

## 2015-06-14 NOTE — Progress Notes (Signed)
Patient: Regina Burke / Admit Date: 06/07/2015 / Date of Encounter: 06/14/2015, 3:06 PM   Subjective: On BiPAP. Reports her breathing is a little better s/p left sided thoracentesis today. No chest pain or palpitations.  Review of Systems: Review of Systems  Constitutional: Positive for malaise/fatigue. Negative for fever, chills and diaphoresis.  Eyes: Negative for discharge and redness.  Respiratory: Positive for shortness of breath. Negative for cough and wheezing.   Cardiovascular: Negative for chest pain.  Musculoskeletal: Negative for falls.  Neurological: Positive for weakness. Negative for dizziness.  Endo/Heme/Allergies: Bruises/bleeds easily.  Psychiatric/Behavioral: The patient is not nervous/anxious.     Objective: Telemetry: NSR, 70's Physical Exam: Blood pressure 124/84, pulse 100, temperature 98.5 F (36.9 C), temperature source Oral, resp. rate 20, height '5\' 5"'$  (1.651 m), weight 162 lb (73.483 kg), SpO2 95 %. Body mass index is 26.96 kg/(m^2). General: Well developed, well nourished, in no acute distress. Head: Normocephalic, atraumatic, sclera non-icteric, no xanthomas, nares are without discharge. Neck: Negative for carotid bruits. JVP not elevated. Lungs: Decreased breath sounds bilaterally. Breathing is unlabored. Heart: RRR S1 S2 without murmurs, rubs, or gallops.  Abdomen: Soft, non-tender, non-distended with normoactive bowel sounds. No rebound/guarding. Extremities: No clubbing or cyanosis. No edema. Distal pedal pulses are 2+ and equal bilaterally. Neuro: Alert and oriented X 3. Moves all extremities spontaneously. Psych:  Responds to questions appropriately with a normal affect.   Intake/Output Summary (Last 24 hours) at 06/14/15 1506 Last data filed at 06/14/15 1347  Gross per 24 hour  Intake    960 ml  Output   1125 ml  Net   -165 ml    Inpatient Medications:  . antiseptic oral rinse  7 mL Mouth Rinse q12n4p  . aspirin EC  81 mg Oral  Daily  . atorvastatin  10 mg Oral QHS  . chlorhexidine  15 mL Mouth Rinse BID  . cholecalciferol  400 Units Oral Daily  . clopidogrel  75 mg Oral Daily  . diltiazem  300 mg Oral Daily  . diphenhydrAMINE  12.5 mg Intravenous Once  . famotidine  20 mg Oral Daily  . febuxostat  40 mg Oral Daily  . ferrous sulfate  325 mg Oral Daily  . heparin  5,000 Units Subcutaneous 3 times per day  . insulin aspart  0-5 Units Subcutaneous QHS  . insulin aspart  0-9 Units Subcutaneous TID WC  . metoprolol succinate  100 mg Oral Daily  . multivitamin-lutein  1 capsule Oral Daily  . sertraline  25 mg Oral Daily  . sodium chloride  3 mL Intravenous Q12H  . vitamin B-12  500 mcg Oral Daily  . vitamin C  500 mg Oral Daily   Infusions:  . sodium chloride 10 mL/hr at 06/09/15 1503    Labs:  Recent Labs  06/13/15 0515 06/14/15 0500  NA 141 140  K 4.9 5.1  CL 104 103  CO2 33* 32  GLUCOSE 107* 98  BUN 36* 40*  CREATININE 1.19* 1.09*  CALCIUM 8.8* 8.7*   No results for input(s): AST, ALT, ALKPHOS, BILITOT, PROT, ALBUMIN in the last 72 hours.  Recent Labs  06/13/15 0515 06/14/15 0500  WBC 4.0 4.1  NEUTROABS 2.5 2.4  HGB 8.6* 8.3*  HCT 25.2* 25.7*  MCV 78.4* 78.9*  PLT 217 226   No results for input(s): CKTOTAL, CKMB, TROPONINI in the last 72 hours. Invalid input(s): POCBNP No results for input(s): HGBA1C in the last 72 hours.   Weights:  Filed Weights   06/07/15 1914  Weight: 162 lb (73.483 kg)     Radiology/Studies:  Dg Chest 1 View  06/14/2015  CLINICAL DATA:  Status post left-sided thoracentesis. EXAM: CHEST 1 VIEW COMPARISON:  June 12, 2015. FINDINGS: Stable cardiomediastinal silhouette. No pneumothorax is noted. Minimal right pleural effusion is noted. Right-sided PICC line is noted with tip in expected position of SVC. Left pleural effusion is resolved status post thoracentesis. IMPRESSION: No pneumothorax status post left-sided thoracentesis. Left pleural effusion is  resolved. Electronically Signed   By: Marijo Conception, M.D.   On: 06/14/2015 10:54   Dg Chest 1 View  06/12/2015  CLINICAL DATA:  Follow-up pleural effusion EXAM: CHEST 1 VIEW COMPARISON:  06/10/2015 FINDINGS: Layering moderate left pleural effusion, increased. Small right pleural effusion, increased. Mild perihilar edema.  No pneumothorax. Right arm PICC terminates at the cavoatrial junction Cardiomegaly. Surgical clips overlying the medial right upper chest. IMPRESSION: Layering moderate left and small right pleural effusions, increased. Cardiomegaly with mild perihilar edema. Electronically Signed   By: Julian Hy M.D.   On: 06/12/2015 10:04   Dg Chest 1 View  06/10/2015  CLINICAL DATA:  Status post right thoracentesis EXAM: CHEST  1 VIEW COMPARISON:  06/09/2015 FINDINGS: There is been near complete resolution of the right-sided pleural effusion following thoracentesis. Adequate re-expansion of the lung is noted with no evidence of pneumothorax. Persistent left-sided pleural effusion is seen. Cardiac shadow is stable. A right-sided PICC line is stable. IMPRESSION: No pneumothorax following right thoracentesis. If clinically desired and the left pleural effusion could be drained on 06/11/2015 Electronically Signed   By: Inez Catalina M.D.   On: 06/10/2015 16:00   Dg Chest 1 View  05/20/2015  CLINICAL DATA:  Shortness of breath. EXAM: CHEST  1 VIEW COMPARISON:  05/17/2015. FINDINGS: Left IJ line in good anatomic position. Mediastinum hilar structures normal. Cardiomegaly with bilateral pulmonary alveolar infiltrates and pleural effusions consistent congestive heart failure. Bilateral pneumonia cannot be excluded. No pneumothorax. Surgical clips right upper chest. IMPRESSION: 1. Lines and tubes in stable position. 2. Cardiomegaly with bilateral basilar pulmonary alveolar infiltrates and pleural effusions consistent with congestive heart failure. Bibasilar pneumonia cannot be excluded. Electronically  Signed   By: Marcello Moores  Register   On: 05/20/2015 07:15   Dg Abd 1 View  05/16/2015  CLINICAL DATA:  Encounter for orogastric tube placement. EXAM: ABDOMEN - 1 VIEW COMPARISON:  None. FINDINGS: Tip of the orogastric tube extends well below the left hemidiaphragm consistent with it being in the mid stomach. There are surgical vascular clips in left upper quadrant. Normal bowel gas pattern. IMPRESSION: Orogastric tube tip projects within the mid stomach. Electronically Signed   By: Lajean Manes M.D.   On: 05/16/2015 21:52   Ct Chest Wo Contrast  06/08/2015  CLINICAL DATA:  Personal history of adenocarcinoma of lung it diagnosed April 2015 and treated with radiation therapy, hospitalized in September with respiratory failure, now with increased dyspnea and central chest pain EXAM: CT CHEST WITHOUT CONTRAST TECHNIQUE: Multidetector CT imaging of the chest was performed following the standard protocol without IV contrast. COMPARISON:  06/07/2015, 06/01/2014 FINDINGS: There is moderate left pleural effusion. The left upper lobe is clear except for mild apical scarring. There is anticipated compressive dependent atelectasis on the left. There is a moderate partially loculated pleural effusion on the right. There is more extensive presumably compressive atelectasis at the right lung base as compared to the left. There is postsurgical change with evidence of  radiation fibrosis medially in the right upper lobe. Beam attenuation artifact is present as the result of right apical clips, and obscures underlying parenchyma. There is a 7 x 45m central opacity that may represent a combination of fibrosis and residual tumor. Limited study without contrast shows no significant hilar or mediastinal adenopathy. There is cardiac enlargement. There is no significant pericardial effusion. There is coronary artery calcification as well as aortic calcification. No abnormalities involving the airway. Thoracic inlet and thyroid are  normal. There are no acute musculoskeletal findings. Images to the upper abdomen partially demonstrate a known right renal cyst as well as a right adrenal mass. Large low-attenuation splenic lesion again identified. Hyper attenuating left renal lesion partially visualized and shows no change from prior study. IMPRESSION: Significant bilateral pleural effusions right greater than left with bilateral compressive atelectasis right worse than left. Postoperative change and known nodule right upper lobe with radiation fibrosis. Multiple abnormalities in the upper abdomen unchanged from prior study. Electronically Signed   By: RSkipper ClicheM.D.   On: 06/08/2015 17:02   [place]al  06/08/2015  CLINICAL DATA:  Acute renal injury EXAM: RENAL / URINARY TRACT ULTRASOUND COMPLETE COMPARISON:  None. FINDINGS: Right Kidney: Length: 10.6 cm. Echogenicity within normal limits. No hydronephrosis visualized. There are several cysts in the right kidney, largest in the lower pole right kidney measuring 2.6 x 1.9 x 2.5 cm. The midpole right kidney cyst is complex measuring 2.3 x 2.1 x 2.2 cm Left Kidney: Length: 10.6 cm. Echogenicity within normal limits. No hydronephrosis visualized. There are small cysts within the left kidney, largest measures 1.4 x 1.4 x 1.6 cm in the lower pole. Bladder: The bladder is not visualized. Patient has a Foley catheter in place. There is a complex cyst in the spleen measuring 5 x 5.1 x 5.3 cm. There is a left pleural effusion. IMPRESSION: Complex cyst in the midpole right kidney. Further evaluation with three-phase renal CT is recommended to exclude solid component. Simple cysts in both kidneys.  No hydronephrosis bilaterally. Complex cyst identified in the spleen. Left pleural effusion. Electronically Signed   By: WAbelardo DieselM.D.   On: 06/08/2015 16:56   Dg Chest Port 1 View  06/09/2015  CLINICAL DATA:  Status post central line placement. EXAM: PORTABLE CHEST 1 VIEW COMPARISON:  Same day.  FINDINGS: Stable cardiomegaly. No pneumothorax is noted. Stable bilateral pleural effusions are noted with probable underlying atelectasis. Bony thorax is unremarkable. Right-sided PICC line has been repositioned, with distal tip in expected position of the SVC. IMPRESSION: Stable cardiomegaly and bilateral pleural effusions are noted with probable underlying atelectasis. Right-sided PICC line has been repositioned with distal tip in expected position of SVC. Electronically Signed   By: JMarijo Conception M.D.   On: 06/09/2015 16:25   Dg Chest Port 1 View  06/09/2015  CLINICAL DATA:  PICC line placement EXAM: PORTABLE CHEST 1 VIEW COMPARISON:  06/07/2015 FINDINGS: Right PICC line is in place. The tip is in the upper right atrium approximately 3 cm deep to the cavoatrial junction. Cardiomegaly. Bilateral perihilar and lower lobe opacities with layering effusions are similar to prior study. IMPRESSION: Right PICC line tip in the upper right atrium 3 cm deep to the cavoatrial junction. No change in the appearance of the chest otherwise. Electronically Signed   By: KRolm BaptiseM.D.   On: 06/09/2015 16:20   Dg Chest Portable 1 View  06/07/2015  CLINICAL DATA:  Wheezing and cough, shortness of breath. History  of COPD, CHF, hypertension, lung cancer in 2014. EXAM: PORTABLE CHEST 1 VIEW COMPARISON:  Chest x-ray dated 05/20/2015. FINDINGS: Cardiomediastinal silhouette appears stable in size and configuration. Cardiomegaly is grossly unchanged. Again noted is central pulmonary vascular congestion and bibasilar airspace opacities. Overall appearance of the lungs is unchanged. Left-sided central line has been removed in the interval. Surgical changes again seen over the right upper lung. IMPRESSION: Overall stable appearance of the chest x-ray. Again noted is cardiomegaly with central pulmonary vascular congestion suggesting volume overload/CHF (persistent versus recurrent). Bibasilar opacities are also unchanged and  probably represent a combination of atelectasis and bibasilar pulmonary edema with adjacent pleural effusions. As also stated on the previous report, bibasilar pneumonia cannot be confidently excluded. Electronically Signed   By: Franki Cabot M.D.   On: 06/07/2015 19:58   Dg Chest Port 1 View  05/17/2015  CLINICAL DATA:  Respiratory failure. EXAM: PORTABLE CHEST - 1 VIEW COMPARISON:  None. FINDINGS: Endotracheal tube, NG line, left IJ line in stable position. Cardiomegaly with pulmonary vascular prominence and bilateral interstitial prominence consistent with congestive heart failure. Interval partial clearing of pulmonary interstitial edema. Small bilateral pleural effusions. Surgical clips right upper abdomen. IMPRESSION: 1. Lines and tubes in stable position. 2. Congestive heart failure with partial clearing of bilateral pulmonary interstitial edema. Small bilateral pleural effusions Electronically Signed   By: [place]   On: 05/17/2015 08:54   Dg Chest Port 1 View  05/16/2015  CLINICAL DATA:  Post intubation.  Central line placement. EXAM: PORTABLE CHEST - 1 VIEW COMPARISON:  05/15/2015 FINDINGS: Endotracheal tube placed with tip measuring 3.6 cm above the carina. Enteric tube tip is off the field of view but below the left hemidiaphragm. Left central venous catheter tip is over the low SVC region. No pneumothorax. Normal heart size and pulmonary vascularity. Some prominence of the central pulmonary arteries. Diffuse infiltration in the right lung increasing since previous study. This could be due to pneumonia or asymmetrical edema. Probable small bilateral pleural effusions and basilar atelectasis. Calcified and tortuous aorta. Surgical clips in the right upper chest and upper abdomen. IMPRESSION: Appliances appear in satisfactory position. Increasing infiltration in the right lung suggesting pneumonia or asymmetric edema. Small bilateral pleural effusions with basilar atelectasis. Electronically  Signed   By: Lucienne Capers M.D.   On: 05/16/2015 21:48   US Thoracentesis Asp Pleural Space W/img Guide  06/14/2015  CLINICAL DATA:  Left pleural effusion. EXAM: ULTRASOUND GUIDED left THORACENTESIS COMPARISON:  June 10, 2015. PROCEDURE: An ultrasound guided thoracentesis was thoroughly discussed with the patient and questions answered. The benefits, risks, alternatives and complications were also discussed. The patient understands and wishes to proceed with the procedure. Written consent was obtained. Ultrasound was performed to localize and mark an adequate pocket of fluid in the left chest. The area was then prepped and draped in the normal sterile fashion. 1% Lidocaine was used for local anesthesia. Under ultrasound guidance a Safe-T-Centesis catheter was introduced. Thoracentesis was performed. The catheter was removed and a dressing applied. Complications:  None immediate. FINDINGS: A total of approximately 800 mL of serous fluid was removed. A fluid sample was notsent for laboratory analysis. IMPRESSION: Successful ultrasound guided left thoracentesis yielding 800 mL of pleural fluid. Electronically Signed   By: Marijo Conception, M.D.   On: 06/14/2015 10:56   US Thoracentesis Asp Pleural Space W/img Guide  06/10/2015  CLINICAL DATA:  Bilateral pleural effusions EXAM: ULTRASOUND GUIDED RIGHT THORACENTESIS PROCEDURE: An ultrasound guided thoracentesis was  thoroughly discussed with the patient and questions answered. The benefits, risks, alternatives and complications were also discussed. The patient understands and wishes to proceed with the procedure. Written consent was obtained. Ultrasound was performed to localize and mark an adequate pocket of fluid in the right chest. The area was then prepped and draped in the normal sterile fashion. 1% Lidocaine was used for local anesthesia. Under ultrasound guidance a 6 French thoracentesis catheter was introduced. Thoracentesis was performed. The  catheter was removed and a dressing applied. COMPLICATIONS: None FINDINGS: A total of approximately 1.1 L of bloody fluid was removed. A fluid sample was sent for laboratory analysis. IMPRESSION: Successful ultrasound guided right thoracentesis yielding 1.1 L of bloody pleural fluid. Patient has a moderate to large effusion on the left which could be drained on 06/11/2015 if clinically desired. Electronically Signed   By: Inez Catalina M.D.   On: 06/10/2015 16:32     Assessment and Plan  81 y.o. female with past medical history including coronary artery disease, paroxysmal atrial fibrillation, COPD, lung cancer, diabetes mellitus, and admitted to [facility] hospital on 12/22/2014 with chest pain, elective cardiac catheterization found to have mild 50% disease in the diagonal branch of the LAD and high-grade 90% hazy stenosis in the marginal branch of the circumflex treated with Promus DES ostium OM3. Hospital admission in August 2016 for electrolyte abnormality, digoxin toxicity, readmitted 06/07/2015 with shortness of breath, found to have atrial fibrillation , potassium of 7, renal failure. She has been anemic this admission and has received pRBCs.  1. Atrial fibrillation, paroxysmal: She remains in sinus rhythm. Continue Cardizem and metoprolol. This appears to be new. She is not being anti-coagulated given anemia. CHADS VASC score 5 so her risk of CVA is high. Will have to consider long term anticoagulation pending workup of anemia and improvement in hgb.   2. Acute respiratory distress: Likely multifactorial including profound anemia, atrial fibrillation, severe COPD, pleural effusions. Management of COPD per pulmonary. She is s/p right thoracentesis. Still with large left pleural effusion. S/p bilateral thoracentesis.   3. Digoxin toxicity: will not restart given this is the second time with dig level problems  4. Anemia: H/H stable. No clear source of anemia. Workup per primary team.   5. CAD:  stable. She is on ASA, Plavix, statin and beta blocker.   Melvern Banker, PA-C Pager: [phone] 06/14/2015, 3:06 PM

## 2015-06-14 NOTE — Progress Notes (Signed)
Palliative Medicine Inpatient Consult Follow Up Note   Name: Regina Burke Date: 06/14/2015 MRN: [id number]  DOB: [birth year]  Referring Physician: Tracie Harrier, MD  Palliative Care consult requested for this 81 y.o. female for goals of medical therapy in patient with respiratory failure.   TODAY'S DISCUSSIONS AND DECISIONS: I just met with son, Regina Burke, who says he is HCPOA and has HCPOA form 'in the car' --and that this was signed yrs ago.  He says she has a BIPAP apparatus at home and he brought it up here for RT to check out, then he took it home. He tells me we will here different stories from him and the pt compared to what his sister might report.  I have spoken with both.  I asked the son to go to the care to get the [facility] form and he did. I waited until this was back as this is very important. Pt currently has capacity to make her own decisions, but she does need her son to weigh in, as he helps her clarify what she means to say. She talks about going home, and one could infer that she won't go to a facility. But, when pressed for clarity by son, she readily says she agrees to go to rehab and then to home.  Son is VERY MUCH in favor of having a PALLIATIVE CARE CONSULT in the facility. Then, he would like her evaluated for Hospice Services in her home, when she is ready to go there. The patient was agreeable to this, though she made it clear that she wasn't ready to die yet.    Pt and son and I discussed code status. She is now DNR.  She would like ELECTIVE intubation and ventilation if it was needed for a short time only --if it could help. But not in an imminent code situation.   IMPRESSION: 1.  Acute on Chronic Hypoxic and Hypercapneic Respiratory Failure ---multifactorial due to scarring from radiation for early lung cancer, COPD, and acute on chronic diastolic CHF. ---on home Oxygen ---son reports she has home BIPAP (he says it is BEPAP --not CPAP --and that he brought it up  here for RT to check out---need to talk to RT) ---bad enough disease to warrant Hospice Involvement as she has a reasonable likelihood of dying within 6 months time from her lung disease (AFTER rehab ) 2.  Acute on chronic diastolic congestive heart failure --- echo 05/17/15 showed EF 55-60% and mild LVH --- Cardiology following and adjusting meds and she will need cardiology follow up 3. Atrial Fibrillation with RVR ---meds adjusted by cardiology ---has played a role in the CHF and effusions most likely ---has had Digoxin Toxicity twice so this should not be used in the future per cardiology 4.  Bilateral Pleural Effusions ---I cannot locate the cytology results but these were ordered from each effusion ---possibly all due to CHF but she has h/o early lung cancer (treated) --need cytology 5.  DM2 6.  Anemia 7.  GERD 8.  Cushing's Dz 9.   Depression 10. CKD  11.  Dyslipidemia 12.  DJD 13.  Obstructive Sleep Apnea 14.  History of Lung Cancer 2015  ---Early stage not known to be metastatic  ---Treated with radiation and per oncology w/o signs of recurrence ---Cytology still pending from two thoracentesis performed while pt was here.  15. CAD  ---H/O Stent 12/23/14 16.  COPD  ---former smoker 72.  Anemia ----unclear etiology (B12 and iron levels are OK) ---  likely chronic disease 18.  Chronic Kidney Disease stage III 19.  Kappa Light Chains slightly high in urine ---ONCOLOGY TO FOLLOW UP (?)   REVIEW OF SYSTEMS:  Feeling much better.  Better appetite.  Wants fruit but not happy with fruit plate as melons are not ripe.  No pain.  No rash. No headache.  Son says she has a BIPAP machine at home adn that he brought it up here for RT (?) to check out last night.  (something about a connection?). Son says pt is ready to go to rehab and pt agrees --with son in the room. She says she plans on dying of a heart attack in her sleep and 'everyone has to die of something', etc.  She occasionally  says some misleading statements --but son helps her to clarify.   CODE STATUS: DNR as of now.  She was full code, but she agrees to DNR. She would want ELECTIVE aggressive care including possible ventilation if it is done electively and not in an imminent code situation.     PAST MEDICAL HISTORY: Past Medical History  Diagnosis Date  . COPD (chronic obstructive pulmonary disease) ([place])   . CHF (congestive heart failure) ([place])   . Hypertension   . Rapid palpitations 2014    Seen at [facility], may have been atrial fibrillation  . DOE (dyspnea on exertion)     Started after treatment for lung cancer  . High cholesterol   . Heart murmur   . Myocardial infarction ([place]) 12/22/2014   . Lung cancer ([place]) dx'd 2014    S/P radiation 2015  . Sleep apnea   . Cushing's disease ([place])   . Type II diabetes mellitus ([place])   . Anemia   . GERD (gastroesophageal reflux disease)   . History of hiatal hernia   . Migraine   . Arthritis   . Gout   . Depression   . Chronic kidney disease (CKD), stage III (moderate)     PAST SURGICAL HISTORY:  Past Surgical History  Procedure Laterality Date  . Adrenalectomy Left 1980's    "[place]ings"  . Abdominal hysterectomy    . Appendectomy    . Cholecystectomy    . Coronary angioplasty with stent placement  12/23/2014  . Tonsillectomy    . Fracture surgery    . Wrist fracture surgery Bilateral ~ 2000  . Breast cyst excision Left   . Tubal ligation    . Left heart catheterization with coronary angiogram N/A 12/23/2014    Procedure: LEFT HEART CATHETERIZATION WITH CORONARY ANGIOGRAM;  Surgeon: Burnell Blanks, MD;  Location: [facility] CATH LAB;  Service: Cardiovascular;  Laterality: N/A;  . Percutaneous coronary stent intervention (pci-s)  12/23/2014    Procedure: PERCUTANEOUS CORONARY STENT INTERVENTION (PCI-S);  Surgeon: Burnell Blanks, MD;  Location: [facility] CATH LAB;  Service: Cardiovascular;;  Promus 2.25x8    Vital Signs: BP 124/84 mmHg  Pulse 100   Temp(Src) 98.5 F (36.9 C) (Oral)  Resp 20  Ht $R'5\' 5"'fb$  (1.651 m)  Wt 73.483 kg (162 lb)  BMI 26.96 kg/m2  SpO2 95% Filed Weights   06/07/15 1914  Weight: 73.483 kg (162 lb)    Estimated body mass index is 26.96 kg/(m^2) as calculated from the following:   Height as of this encounter: $RemoveBeforeD'5\' 5"'xiOPWldVpoiBSw$  (1.651 m).   Weight as of this encounter: 73.483 kg (162 lb).  PHYSICAL EXAM: Alert, conversant.oriented She does say some misleading things at times and when son, Regina Burke, is present, he  can get her to clarify and then it is not so misleading (like saying she is going home to cook and live life like before --but then saying 'after she goes to the faciiity for rehab'. ) EOMI OP clear On nasal cannula Oxygen Neck w/o JVD or TM Hrt rrr no mgr Lungs some ronchi but otw clear  Abd soft and nontender Skin without mottling or cyanosis  LABS: CBC:    Component Value Date/Time   WBC 4.1 06/14/2015 0500   WBC 7.6 12/09/2014 1325   HGB 8.3* 06/14/2015 0500   HGB 11.7* 12/09/2014 1325   HCT 25.7* 06/14/2015 0500   HCT 36.1 12/09/2014 1325   PLT 226 06/14/2015 0500   PLT 226 12/09/2014 1325   MCV 78.9* 06/14/2015 0500   MCV 78* 12/09/2014 1325   NEUTROABS 2.4 06/14/2015 0500   NEUTROABS 6.0 12/09/2014 1325   LYMPHSABS 0.9* 06/14/2015 0500   LYMPHSABS 0.9* 12/09/2014 1325   MONOABS 0.4 06/14/2015 0500   MONOABS 0.4 12/09/2014 1325   EOSABS 0.3 06/14/2015 0500   EOSABS 0.2 12/09/2014 1325   BASOSABS 0.0 06/14/2015 0500   BASOSABS 0.1 12/09/2014 1325   Comprehensive Metabolic Panel:    Component Value Date/Time   NA 140 06/14/2015 0500   NA 134* 12/09/2014 1325   K 5.1 06/14/2015 0500   K 4.5 12/09/2014 1325   CL 103 06/14/2015 0500   CL 105 12/09/2014 1325   CO2 32 06/14/2015 0500   CO2 23 12/09/2014 1325   BUN 40* 06/14/2015 0500   BUN 31* 12/09/2014 1325   CREATININE 1.09* 06/14/2015 0500   CREATININE 1.27* 12/09/2014 1325   GLUCOSE 98 06/14/2015 0500   GLUCOSE 181* 12/09/2014  1325   CALCIUM 8.7* 06/14/2015 0500   CALCIUM 8.7* 12/09/2014 1325   AST 15 06/07/2015 1939   AST 25 12/09/2014 1325   ALT 14 06/07/2015 1939   ALT 16 12/09/2014 1325   ALKPHOS 60 06/07/2015 1939   ALKPHOS 44 12/09/2014 1325   BILITOT 0.7 06/07/2015 1939   BILITOT 0.6 12/09/2014 1325   PROT 6.9 06/07/2015 1939   PROT 6.8 12/09/2014 1325   ALBUMIN 3.5 06/07/2015 1939   ALBUMIN 3.9 12/09/2014 1325    REFERRALS TO BE ORDERED:  Palliative Care in the facility  More than 50% of the visit was spent in counseling/coordination of care: YES  Time Spent:  80 min

## 2015-06-14 NOTE — Progress Notes (Signed)
PROGRESS NOTE  Regina Burke GYK:[id number] DOB: November 15, 1932 DOA: 06/07/2015 PCP: Tracie Harrier, MD  Subjective 81 y/o f with hx of Chronic A-fib, Ca  Lung, Chronic  Hypoxemic and Hypercapnic Respiratory Failure, HTN, OSA, Type 2 DM admitted with  Hyperkalemia , acute Renal failure , A-fib with RVR CT Chest Bilat Pleural effusions- Rt worse than left with compressive atelectasis and Rt upper lobe nodule with radiation fibrosis.. Pt has been on BIPAP at night. Underwent thoracentesis of left Pleural space     Objective: BP 119/64 mmHg  Pulse 100  Temp(Src) 97.7 F (36.5 C) (Oral)  Resp 20  Ht '5\' 5"'$  (1.651 m)  Wt 73.483 kg (162 lb)  BMI 26.96 kg/m2  SpO2 97%  Intake/Output Summary (Last 24 hours) at 06/14/15 1249 Last data filed at 06/14/15 0943  Gross per 24 hour  Intake   1320 ml  Output   1425 ml  Net   -105 ml   Filed Weights   06/07/15 1914  Weight: 73.483 kg (162 lb)    Exam:  General: Pale looking. Dyspneic art rest  HEENT: PERRL; OP moist without lesions. Neck: supple, trachea midline, no thyromegaly Chest: normal to palpation Lungs: Decreased air entry bilat  Cardiovascular:Irregular rate  Abdomen: soft, nontender, nondistended, positive bowel sounds Extremities: no clubbing, cyanosis, edema Neuro: alert and oriented, moves all extremities Derm: Pressure ulcer left butttock Lymph: no cervical or supraclavicular lymphadenopathy   Labs and imaging studies were reviewed*  Data Reviewed: Basic Metabolic Panel:  Recent Labs Lab 06/10/15 0512 06/11/15 0448 06/12/15 0553 06/13/15 0515 06/14/15 0500  NA 142 142 140 141 140  K 4.1 4.6 5.0 4.9 5.1  CL 103 105 103 104 103  CO2 32 33* 32 33* 32  GLUCOSE 121* 108* 123* 107* 98  BUN 46* 36* 33* 36* 40*  CREATININE 1.20* 1.05* 1.06* 1.19* 1.09*  CALCIUM 8.8* 8.7* 8.0* 8.8* 8.7*   Liver Function Tests:  Recent Labs Lab 06/07/15 1939  AST 15  ALT 14  ALKPHOS 60  BILITOT 0.7  PROT 6.9   ALBUMIN 3.5   No results for input(s): LIPASE, AMYLASE in the last 168 hours. No results for input(s): AMMONIA in the last 168 hours. CBC:  Recent Labs Lab 06/10/15 0512 06/11/15 0448 06/12/15 0553 06/13/15 0515 06/14/15 0500  WBC 3.9 3.4* 3.6 4.0 4.1  NEUTROABS 2.8 2.3 2.4 2.5 2.4  HGB 8.0* 7.9* 8.4* 8.6* 8.3*  HCT 24.6* 24.1* 25.9* 25.2* 25.7*  MCV 80.9 80.2 78.8* 78.4* 78.9*  PLT 219 217 226 217 226   Cardiac Enzymes:    Recent Labs Lab 06/07/15 1939  TROPONINI 0.03   BNP (last 3 results)  Recent Labs  12/23/14 0434 05/15/15 1233 06/07/15 1939  BNP 213.8* 560.0* 941.0*    ProBNP (last 3 results) No results for input(s): PROBNP in the last 8760 hours.  CBG:  Recent Labs Lab 06/13/15 1111 06/13/15 1558 06/13/15 2104 06/14/15 0829 06/14/15 1159  GLUCAP 188* 105* 105* 118* 130*    Recent Results (from the past 240 hour(s))  MRSA PCR Screening     Status: None   Collection Time: 06/08/15 12:30 AM  Result Value Ref Range Status   MRSA by PCR NEGATIVE NEGATIVE Final    Comment:        The GeneXpert MRSA Assay (FDA approved for NASAL specimens only), is one component of a comprehensive MRSA colonization surveillance program. It is not intended to diagnose MRSA infection nor to guide or monitor treatment  for MRSA infections.   Body fluid culture     Status: None   Collection Time: 06/10/15  3:30 PM  Result Value Ref Range Status   Specimen Description PLEURAL  Final   Special Requests Normal  Final   Gram Stain RARE WBC SEEN NO ORGANISMS SEEN   Final   Culture NO ANAEROBES ISOLATED NO GROWTH 4 DAYS   Final   Report Status 06/14/2015 FINAL  Final     Studies: Dg Chest 1 View  06/14/2015  CLINICAL DATA:  Status post left-sided thoracentesis. EXAM: CHEST 1 VIEW COMPARISON:  June 12, 2015. FINDINGS: Stable cardiomediastinal silhouette. No pneumothorax is noted. Minimal right pleural effusion is noted. Right-sided PICC line is noted with  tip in expected position of SVC. Left pleural effusion is resolved status post thoracentesis. IMPRESSION: No pneumothorax status post left-sided thoracentesis. Left pleural effusion is resolved. Electronically Signed   By: Marijo Conception, M.D.   On: 06/14/2015 10:54   US Thoracentesis Asp Pleural Space W/img Guide  06/14/2015  CLINICAL DATA:  Left pleural effusion. EXAM: ULTRASOUND GUIDED left THORACENTESIS COMPARISON:  June 10, 2015. PROCEDURE: An ultrasound guided thoracentesis was thoroughly discussed with the patient and questions answered. The benefits, risks, alternatives and complications were also discussed. The patient understands and wishes to proceed with the procedure. Written consent was obtained. Ultrasound was performed to localize and mark an adequate pocket of fluid in the left chest. The area was then prepped and draped in the normal sterile fashion. 1% Lidocaine was used for local anesthesia. Under ultrasound guidance a Safe-T-Centesis catheter was introduced. Thoracentesis was performed. The catheter was removed and a dressing applied. Complications:  None immediate. FINDINGS: A total of approximately 800 mL of serous fluid was removed. A fluid sample was notsent for laboratory analysis. IMPRESSION: Successful ultrasound guided left thoracentesis yielding 800 mL of pleural fluid. Electronically Signed   By: Marijo Conception, M.D.   On: 06/14/2015 10:56    Scheduled Meds: . antiseptic oral rinse  7 mL Mouth Rinse q12n4p  . aspirin EC  81 mg Oral Daily  . atorvastatin  10 mg Oral QHS  . chlorhexidine  15 mL Mouth Rinse BID  . cholecalciferol  400 Units Oral Daily  . clopidogrel  75 mg Oral Daily  . diltiazem  300 mg Oral Daily  . diphenhydrAMINE  12.5 mg Intravenous Once  . famotidine  20 mg Oral Daily  . febuxostat  40 mg Oral Daily  . ferrous sulfate  325 mg Oral Daily  . heparin  5,000 Units Subcutaneous 3 times per day  . insulin aspart  0-5 Units Subcutaneous QHS  .  insulin aspart  0-9 Units Subcutaneous TID WC  . metoprolol succinate  100 mg Oral Daily  . multivitamin-lutein  1 capsule Oral Daily  . sertraline  25 mg Oral Daily  . sodium chloride  3 mL Intravenous Q12H  . vitamin B-12  500 mcg Oral Daily  . vitamin C  500 mg Oral Daily    Continuous Infusions: . sodium chloride 10 mL/hr at 06/09/15 1503    Assessment/Plan:   1 Atrial fibrillation, with RVR; Rate controlled on Diltiazem   2  Hypercapnic and Hypoxemic respiratory failure- On BIPAP and supplemental O2  3 Acute kidney injury ([place]); Improving- Se Creat;1.05  4 Hyperkalemia; Improved with KayexalateContinue to monitor  5  Bilat pleural effusions  S/p Thoracentesis-bilat   Awaiting results of cytology of pleural fluid  6 Anemia; Hgb  is  stable- 8.3 -Continue to monitor  7 Type 2 DM; On SSI  8 Physical therapy- For rehab   Code Status: Full code  Family Communication: son and daughter        06/14/2015, 12:49 PM  LOS: 7 days

## 2015-06-14 NOTE — Procedures (Signed)
Under US guidance, thoracentesis of left pleural effusion was performed. CXR to follow.

## 2015-06-14 NOTE — Progress Notes (Signed)
Palliative Care Update  I have met with the Regina Burke's son, [name], and the Regina Burke.  Regina Burke is in her right/ competent mind today.  She is on 3 lpm nasal cannula oxygen and quite comfortable.  Also, she HAS A BIPAP MACHINE AT HOME.   She walked with a walker and two aides to the bathroom (I witnessed).  She DOES agree to go to SNF and get rehab.  She also occasionally says misleading statements that might be misinterpreted to mean she does not want to go to rehab/ SNF, but if one clarifies in talking with her, she fully agrees with this plan.  Additionally, I asked son [name] to go to his car while I waited to get the [facility] forms and bring them to me.  I have made copies for the paper record. A copy should go with Regina Burke to the facility.  Even though Regina Burke is in her right mind now, there have been times here when she wasn't (due to being so short of breath and critically ill).  I stressed the importance of the son taking this piece of paper to the facility and to any health care encounter going forward.    The Regina Burke's daughter, [name], is not HCPOA.  She is not, according to the son, the 'primary care giver'.  Regina Burke is apparently independent normally, so she herself says that she doesn't have a 'primary care giver'.  Son, [name], wants [place] to only deal with him when we are asking questions and that is when the Regina Burke cannot speak for herself, which she can today (albeit with some misleading statements thrown in for excitement I guess).  We are to [facility], but ASK TROY and Regina Burke herself going forward.   Additionallly, I clarified code status today with Regina Burke while [name] was in the room. He had said she always wants to be DNR but she does want to be on a ventilator if she is NOT ALREADY DEAD and if it would help ---in other words, only in an elective situation where we were trying to decide between bipap and the vent etc, as had recently occurred with her.   She agrees with a DNR status and I have completed a portable  DNR form for the paper chart.    See full note to follow.  Will update social worker --Regina Burke ready to go to SNF and she is to have a [place] while there so she can be assessed for Hospice Services in the home when it is time for her to go home from the facility.  The Regina Burke and son agree with this plan.   Colleen Can, MD

## 2015-06-15 DIAGNOSIS — I5033 Acute on chronic diastolic (congestive) heart failure: Secondary | ICD-10-CM | POA: Insufficient documentation

## 2015-06-15 DIAGNOSIS — Z9889 Other specified postprocedural states: Secondary | ICD-10-CM | POA: Insufficient documentation

## 2015-06-15 LAB — CBC WITH DIFFERENTIAL/PLATELET
BASOS PCT: 1 %
Basophils Absolute: 0 10*3/uL (ref 0–0.1)
Eosinophils Absolute: 0.2 10*3/uL (ref 0–0.7)
Eosinophils Relative: 5 %
HCT: 25 % — ABNORMAL LOW (ref 35.0–47.0)
HEMOGLOBIN: 8.4 g/dL — AB (ref 12.0–16.0)
LYMPHS ABS: 1 10*3/uL (ref 1.0–3.6)
LYMPHS PCT: 23 %
MCH: 26.4 pg (ref 26.0–34.0)
MCHC: 33.7 g/dL (ref 32.0–36.0)
MCV: 78.4 fL — AB (ref 80.0–100.0)
MONO ABS: 0.4 10*3/uL (ref 0.2–0.9)
MONOS PCT: 9 %
NEUTROS ABS: 2.7 10*3/uL (ref 1.4–6.5)
NEUTROS PCT: 62 %
Platelets: 230 10*3/uL (ref 150–440)
RBC: 3.19 MIL/uL — ABNORMAL LOW (ref 3.80–5.20)
RDW: 16.1 % — AB (ref 11.5–14.5)
WBC: 4.3 10*3/uL (ref 3.6–11.0)

## 2015-06-15 LAB — BASIC METABOLIC PANEL
Anion gap: 4 — ABNORMAL LOW (ref 5–15)
BUN: 40 mg/dL — AB (ref 6–20)
CALCIUM: 8.5 mg/dL — AB (ref 8.9–10.3)
CHLORIDE: 102 mmol/L (ref 101–111)
CO2: 31 mmol/L (ref 22–32)
CREATININE: 1.21 mg/dL — AB (ref 0.44–1.00)
GFR calc Af Amer: 48 mL/min — ABNORMAL LOW (ref >60)
GFR calc non Af Amer: 41 mL/min — ABNORMAL LOW (ref >60)
GLUCOSE: 92 mg/dL (ref 65–99)
Potassium: 5.5 mmol/L — ABNORMAL HIGH (ref 3.5–5.1)
Sodium: 137 mmol/L (ref 135–145)

## 2015-06-15 LAB — CYTOLOGY - NON PAP

## 2015-06-15 LAB — GLUCOSE, CAPILLARY
Glucose-Capillary: 91 mg/dL (ref 65–99)
Glucose-Capillary: 134 mg/dL — ABNORMAL HIGH (ref 65–99)

## 2015-06-15 MED ORDER — DILTIAZEM HCL ER 240 MG PO CP24: 300.0000 mg | ORAL_CAPSULE | Freq: Every day | ORAL | Status: DC

## 2015-06-15 MED ORDER — FUROSEMIDE 20 MG PO TABS: 20.0000 mg | ORAL_TABLET | Freq: Every day | ORAL | Status: DC

## 2015-06-15 NOTE — Discharge Summary (Signed)
Physician Discharge Summary  Regina Burke QAS:[id number] DOB: 07-01-1933 DOA: 06/07/2015  PCP: Tracie Harrier, MD  Admit date: 06/07/2015 Discharge date: 06/15/2015  Time spent: 2mnutes  Recommendations for Outpatient Follow-up:  1. Discharge to Skilled Nursing  2. Call office to make appt in 3-4weeks  Discharge Diagnoses:    1 Atrial fibrillation, rapid  Ventricular rate  2 [place]  3 Hyperkalemia  4  Pressure ulcer  5  H/O: lung cancer  6  Bilateral Pleural effusion- s/p Thoracentesis   7  Hypercapnic and Hypoxemic Respiratory failure - On BIPAP at night  8 History of  Coronary artery disease involving native coronary artery of native heart without angina pectoris   S/P coronary artery stent placement  9  Renal failure 10 Type 2 DM 11 Acute on chronic diastolic CHF (congestive heart failure), NYHA class 1 ([place]   Discharge Condition: Stable  Diet recommendation: 1800 cals  Filed Weights   06/07/15 1914  Weight: 73.483 kg (162 lb)    History of present illness:   Regina Burke a 81year old female with a history of chronic A. fib COPD diastolic CHF hypertension. CA lung, sleep apnea, who recently been discharged to rehabilitation, was readmitted with elevated potassium level of 7.1, and also atrial fibrillation with rapid ventricular rate. She was admitted to the CCU for IV Cardizem.   Hospital Course:  During her stay in hospital patient-was notably hypercapnic and had bilateral pleural effusions. Patient underwent ultrasound-guided thoracentesis of the right pleural space on 06/10/15 and had a another procedure on the leftas well on 06/14/2015 patient was seen in consultation by the pulmonary/ICU team. Her A. fib came under control with the IV Cardizem and subsequently switched to by mouth Cardizem and digoxin level digoxin was stopped because elevated digoxin levels She did well with the BiPAP at night Patient was also seen by physical  therapy and was discharged in stable condition to skilled nursing for some   Cytology from pleural fluid had not come back as yet. Patient was seen repetitive care discussion with family she agreed for DO NOT RESUSCITATE status Patient was discharged in stable condition to skilled nursing facility   Procedures:  Thoracentesis- Rt Pleural space on 06/10/15  Thoracentesis -Left pleural space- 06/14/15  Consultations:  Dr. SAlva Garnet Pulmonary  Dr.Kassa- Pulmonary  Dr. GCandis Musa Cardiology.  Dr. BRogue Bussing Oncologist    Discharge Exam: Filed Vitals:   06/15/15 1130  BP: 112/41  Pulse: 75  Temp: 97.9 F (36.6 C)  Resp: 18    General: Pale  Cardiovascular: S1 S2 Respiratory: Decreased air entry bilat   Discharge Instructions 02 2-3 l n/c  BIPAP at night. Sleep study as out pt. Palliative  care needs to follow her at SNF  Current Discharge Medication List    START taking these medications   Details  furosemide (LASIX) 20 MG tablet Take 1 tablet (20 mg total) by mouth daily. Qty: 30 tablet, Refills: 5      CONTINUE these medications which have CHANGED   Details  diltiazem (DILACOR XR) 240 MG 24 hr capsule Take 1 capsule (240 mg total) by mouth daily. Qty: 30 capsule, Refills: 5      CONTINUE these medications which have NOT CHANGED   Details  ALPRAZolam (XANAX) 0.25 MG tablet Take 1 tablet (0.25 mg total) by mouth 3 (three) times daily as needed for anxiety. Qty: 30 tablet, Refills: 0    aspirin EC 81 MG tablet Take 1 tablet (81 mg total)  by mouth daily. Qty: 90 tablet, Refills: 3    atorvastatin (LIPITOR) 10 MG tablet Take 10 mg by mouth daily.    cholecalciferol (VITAMIN D) 400 UNITS TABS tablet Take 400 Units by mouth daily.    clopidogrel (PLAVIX) 75 MG tablet Take 1 tablet (75 mg total) by mouth daily. Qty: 30 tablet, Refills: 11    Cyanocobalamin (VITAMIN B-12) 500 MCG SUBL Place 500 mcg under the tongue daily.    docusate sodium (COLACE) 100 MG  capsule Take 1 capsule (100 mg total) by mouth 2 (two) times daily as needed for mild constipation. Qty: 10 capsule, Refills: 0    famotidine (PEPCID) 20 MG tablet Take 1 tablet (20 mg total) by mouth daily. Qty: 30 tablet, Refills: 3    febuxostat (ULORIC) 40 MG tablet Take 40 mg by mouth daily.     fenofibrate (TRICOR) 48 MG tablet Take 48 mg by mouth daily.    ferrous sulfate 325 (65 FE) MG EC tablet Take 325 mg by mouth daily.     ipratropium-albuterol (DUONEB) 0.5-2.5 (3) MG/3ML SOLN Take 3 mLs by nebulization every 4 (four) hours as needed. Qty: 360 mL, Refills: 3    metFORMIN (GLUCOPHAGE) 500 MG tablet Take 500 mg by mouth 2 (two) times daily with a meal.    metoprolol succinate (TOPROL-XL) 100 MG 24 hr tablet Take 1 tablet (100 mg total) by mouth daily. Take with or immediately following a meal. Qty: 30 tablet, Refills: 11    Multiple Vitamins-Minerals (PRESERVISION AREDS 2) CAPS Take 1 capsule by mouth daily.    nitroGLYCERIN (NITROSTAT) 0.4 MG SL tablet Place 1 tablet (0.4 mg total) under the tongue every 5 (five) minutes as needed for chest pain. Qty: 25 tablet, Refills: 3    senna (SENOKOT) 8.6 MG TABS tablet Take 1 tablet (8.6 mg total) by mouth daily as needed for mild constipation. Qty: 120 each, Refills: 0    sertraline (ZOLOFT) 25 MG tablet Take 25 mg by mouth daily.    vitamin C (ASCORBIC ACID) 500 MG tablet Take 500 mg by mouth daily.      STOP taking these medications     digoxin (DIGITEK) 0.125 MG tablet      traMADol (ULTRAM) 50 MG tablet        Allergies  Allergen Reactions  . Ciprofloxacin Shortness Of Breath, Itching and Rash  . Doxycycline Shortness Of Breath, Itching and Rash  . Penicillins Shortness Of Breath, Itching and Rash  . Sulfa Antibiotics Shortness Of Breath, Itching and Rash  . Morphine And Related Itching      The results of significant diagnostics from this hospitalization (including imaging, microbiology, ancillary and  laboratory) are listed below for reference.    Significant Diagnostic Studies: Dg Chest 1 View  06/14/2015  CLINICAL DATA:  Status post left-sided thoracentesis. EXAM: CHEST 1 VIEW COMPARISON:  June 12, 2015. FINDINGS: Stable cardiomediastinal silhouette. No pneumothorax is noted. Minimal right pleural effusion is noted. Right-sided PICC line is noted with tip in expected position of SVC. Left pleural effusion is resolved status post thoracentesis. IMPRESSION: No pneumothorax status post left-sided thoracentesis. Left pleural effusion is resolved. Electronically Signed   By: Marijo Conception, M.D.   On: 06/14/2015 10:54   Dg Chest 1 View  06/12/2015  CLINICAL DATA:  Follow-up pleural effusion EXAM: CHEST 1 VIEW COMPARISON:  06/10/2015 FINDINGS: Layering moderate left pleural effusion, increased. Small right pleural effusion, increased. Mild perihilar edema.  No pneumothorax. Right arm PICC terminates  at the cavoatrial junction Cardiomegaly. Surgical clips overlying the medial right upper chest. IMPRESSION: Layering moderate left and small right pleural effusions, increased. Cardiomegaly with mild perihilar edema. Electronically Signed   By: Julian Hy M.D.   On: 06/12/2015 10:04   Dg Chest 1 View  06/10/2015  CLINICAL DATA:  Status post right thoracentesis EXAM: CHEST  1 VIEW COMPARISON:  06/09/2015 FINDINGS: There is been near complete resolution of the right-sided pleural effusion following thoracentesis. Adequate re-expansion of the lung is noted with no evidence of pneumothorax. Persistent left-sided pleural effusion is seen. Cardiac shadow is stable. A right-sided PICC line is stable. IMPRESSION: No pneumothorax following right thoracentesis. If clinically desired and the left pleural effusion could be drained on 06/11/2015 Electronically Signed   By: Inez Catalina M.D.   On: 06/10/2015 16:00   Dg Chest 1 View  05/20/2015  CLINICAL DATA:  Shortness of breath. EXAM: CHEST  1 VIEW  COMPARISON:  05/17/2015. FINDINGS: Left IJ line in good anatomic position. Mediastinum hilar structures normal. Cardiomegaly with bilateral pulmonary alveolar infiltrates and pleural effusions consistent congestive heart failure. Bilateral pneumonia cannot be excluded. No pneumothorax. Surgical clips right upper chest. IMPRESSION: 1. Lines and tubes in stable position. 2. Cardiomegaly with bilateral basilar pulmonary alveolar infiltrates and pleural effusions consistent with congestive heart failure. Bibasilar pneumonia cannot be excluded. Electronically Signed   By: Marcello Moores  Register   On: 05/20/2015 07:15   Dg Abd 1 View  05/16/2015  CLINICAL DATA:  Encounter for orogastric tube placement. EXAM: ABDOMEN - 1 VIEW COMPARISON:  None. FINDINGS: Tip of the orogastric tube extends well below the left hemidiaphragm consistent with it being in the mid stomach. There are surgical vascular clips in left upper quadrant. Normal bowel gas pattern. IMPRESSION: Orogastric tube tip projects within the mid stomach. Electronically Signed   By: Lajean Manes M.D.   On: 05/16/2015 21:52   Ct Chest Wo Contrast  06/08/2015  CLINICAL DATA:  Personal history of adenocarcinoma of lung it diagnosed April 2015 and treated with radiation therapy, hospitalized in September with respiratory failure, now with increased dyspnea and central chest pain EXAM: CT CHEST WITHOUT CONTRAST TECHNIQUE: Multidetector CT imaging of the chest was performed following the standard protocol without IV contrast. COMPARISON:  06/07/2015, 06/01/2014 FINDINGS: There is moderate left pleural effusion. The left upper lobe is clear except for mild apical scarring. There is anticipated compressive dependent atelectasis on the left. There is a moderate partially loculated pleural effusion on the right. There is more extensive presumably compressive atelectasis at the right lung base as compared to the left. There is postsurgical change with evidence of radiation  fibrosis medially in the right upper lobe. Beam attenuation artifact is present as the result of right apical clips, and obscures underlying parenchyma. There is a 7 x 70m central opacity that may represent a combination of fibrosis and residual tumor. Limited study without contrast shows no significant hilar or mediastinal adenopathy. There is cardiac enlargement. There is no significant pericardial effusion. There is coronary artery calcification as well as aortic calcification. No abnormalities involving the airway. Thoracic inlet and thyroid are normal. There are no acute musculoskeletal findings. Images to the upper abdomen partially demonstrate a known right renal cyst as well as a right adrenal mass. Large low-attenuation splenic lesion again identified. Hyper attenuating left renal lesion partially visualized and shows no change from prior study. IMPRESSION: Significant bilateral pleural effusions right greater than left with bilateral compressive atelectasis right worse than left.  Postoperative change and known nodule right upper lobe with radiation fibrosis. Multiple abnormalities in the upper abdomen unchanged from prior study. Electronically Signed   By: Skipper Cliche M.D.   On: 06/08/2015 17:02   US Renal  06/08/2015  CLINICAL DATA:  Acute renal injury EXAM: RENAL / URINARY TRACT ULTRASOUND COMPLETE COMPARISON:  None. FINDINGS: Right Kidney: Length: 10.6 cm. Echogenicity within normal limits. No hydronephrosis visualized. There are several cysts in the right kidney, largest in the lower pole right kidney measuring 2.6 x 1.9 x 2.5 cm. The midpole right kidney cyst is complex measuring 2.3 x 2.1 x 2.2 cm Left Kidney: Length: 10.6 cm. Echogenicity within normal limits. No hydronephrosis visualized. There are small cysts within the left kidney, largest measures 1.4 x 1.4 x 1.6 cm in the lower pole. Bladder: The bladder is not visualized. Patient has a Foley catheter in place. There is a complex cyst  in the spleen measuring 5 x 5.1 x 5.3 cm. There is a left pleural effusion. IMPRESSION: Complex cyst in the midpole right kidney. Further evaluation with three-phase renal CT is recommended to exclude solid component. Simple cysts in both kidneys.  No hydronephrosis bilaterally. Complex cyst identified in the spleen. Left pleural effusion. Electronically Signed   By: Abelardo Diesel M.D.   On: 06/08/2015 16:56   Dg Chest Port 1 View  06/09/2015  CLINICAL DATA:  Status post central line placement. EXAM: PORTABLE CHEST 1 VIEW COMPARISON:  Same day. FINDINGS: Stable cardiomegaly. No pneumothorax is noted. Stable bilateral pleural effusions are noted with probable underlying atelectasis. Bony thorax is unremarkable. Right-sided PICC line has been repositioned, with distal tip in expected position of the SVC. IMPRESSION: Stable cardiomegaly and bilateral pleural effusions are noted with probable underlying atelectasis. Right-sided PICC line has been repositioned with distal tip in expected position of SVC. Electronically Signed   By: Marijo Conception, M.D.   On: 06/09/2015 16:25   Dg Chest Port 1 View  06/09/2015  CLINICAL DATA:  PICC line placement EXAM: PORTABLE CHEST 1 VIEW COMPARISON:  06/07/2015 FINDINGS: Right PICC line is in place. The tip is in the upper right atrium approximately 3 cm deep to the cavoatrial junction. Cardiomegaly. Bilateral perihilar and lower lobe opacities with layering effusions are similar to prior study. IMPRESSION: Right PICC line tip in the upper right atrium 3 cm deep to the cavoatrial junction. No change in the appearance of the chest otherwise. Electronically Signed   By: Rolm Baptise M.D.   On: 06/09/2015 16:20   Dg Chest Portable 1 View  06/07/2015  CLINICAL DATA:  Wheezing and cough, shortness of breath. History of COPD, CHF, hypertension, lung cancer in 2014. EXAM: PORTABLE CHEST 1 VIEW COMPARISON:  Chest x-ray dated 05/20/2015. FINDINGS: Cardiomediastinal silhouette  appears stable in size and configuration. Cardiomegaly is grossly unchanged. Again noted is central pulmonary vascular congestion and bibasilar airspace opacities. Overall appearance of the lungs is unchanged. Left-sided central line has been removed in the interval. Surgical changes again seen over the right upper lung. IMPRESSION: Overall stable appearance of the chest x-ray. Again noted is cardiomegaly with central pulmonary vascular congestion suggesting volume overload/CHF (persistent versus recurrent). Bibasilar opacities are also unchanged and probably represent a combination of atelectasis and bibasilar pulmonary edema with adjacent pleural effusions. As also stated on the previous report, bibasilar pneumonia cannot be confidently excluded. Electronically Signed   By: Franki Cabot M.D.   On: 06/07/2015 19:58   Dg Chest [facility]  05/17/2015  CLINICAL DATA:  Respiratory failure. EXAM: PORTABLE CHEST - 1 VIEW COMPARISON:  None. FINDINGS: Endotracheal tube, NG line, left IJ line in stable position. Cardiomegaly with pulmonary vascular prominence and bilateral interstitial prominence consistent with congestive heart failure. Interval partial clearing of pulmonary interstitial edema. Small bilateral pleural effusions. Surgical clips right upper abdomen. IMPRESSION: 1. Lines and tubes in stable position. 2. Congestive heart failure with partial clearing of bilateral pulmonary interstitial edema. Small bilateral pleural effusions Electronically Signed   By: [place]   On: 05/17/2015 08:54   Dg Chest Port 1 View  05/16/2015  CLINICAL DATA:  Post intubation.  Central line placement. EXAM: PORTABLE CHEST - 1 VIEW COMPARISON:  05/15/2015 FINDINGS: Endotracheal tube placed with tip measuring 3.6 cm above the carina. Enteric tube tip is off the field of view but below the left hemidiaphragm. Left central venous catheter tip is over the low SVC region. No pneumothorax. Normal heart size and pulmonary  vascularity. Some prominence of the central pulmonary arteries. Diffuse infiltration in the right lung increasing since previous study. This could be due to pneumonia or asymmetrical edema. Probable small bilateral pleural effusions and basilar atelectasis. Calcified and tortuous aorta. Surgical clips in the right upper chest and upper abdomen. IMPRESSION: Appliances appear in satisfactory position. Increasing infiltration in the right lung suggesting pneumonia or asymmetric edema. Small bilateral pleural effusions with basilar atelectasis. Electronically Signed   By: Lucienne Capers M.D.   On: 05/16/2015 21:48   US Thoracentesis Asp Pleural Space W/img Guide  06/14/2015  CLINICAL DATA:  Left pleural effusion. EXAM: ULTRASOUND GUIDED left THORACENTESIS COMPARISON:  June 10, 2015. PROCEDURE: An ultrasound guided thoracentesis was thoroughly discussed with the patient and questions answered. The benefits, risks, alternatives and complications were also discussed. The patient understands and wishes to proceed with the procedure. Written consent was obtained. Ultrasound was performed to localize and mark an adequate pocket of fluid in the left chest. The area was then prepped and draped in the normal sterile fashion. 1% Lidocaine was used for local anesthesia. Under ultrasound guidance a Safe-T-Centesis catheter was introduced. Thoracentesis was performed. The catheter was removed and a dressing applied. Complications:  None immediate. FINDINGS: A total of approximately 800 mL of serous fluid was removed. A fluid sample was notsent for laboratory analysis. IMPRESSION: Successful ultrasound guided left thoracentesis yielding 800 mL of pleural fluid. Electronically Signed   By: Marijo Conception, M.D.   On: 06/14/2015 10:56   US Thoracentesis Asp Pleural Space W/img Guide  06/10/2015  CLINICAL DATA:  Bilateral pleural effusions EXAM: ULTRASOUND GUIDED RIGHT THORACENTESIS PROCEDURE: An ultrasound guided  thoracentesis was thoroughly discussed with the patient and questions answered. The benefits, risks, alternatives and complications were also discussed. The patient understands and wishes to proceed with the procedure. Written consent was obtained. Ultrasound was performed to localize and mark an adequate pocket of fluid in the right chest. The area was then prepped and draped in the normal sterile fashion. 1% Lidocaine was used for local anesthesia. Under ultrasound guidance a 6 French thoracentesis catheter was introduced. Thoracentesis was performed. The catheter was removed and a dressing applied. COMPLICATIONS: None FINDINGS: A total of approximately 1.1 L of bloody fluid was removed. A fluid sample was sent for laboratory analysis. IMPRESSION: Successful ultrasound guided right thoracentesis yielding 1.1 L of bloody pleural fluid. Patient has a moderate to large effusion on the left which could be drained on 06/11/2015 if clinically desired. Electronically Signed   By:  Inez Catalina M.D.   On: 06/10/2015 16:32    Microbiology: Recent Results (from the past 240 hour(s))  MRSA PCR Screening     Status: None   Collection Time: 06/08/15 12:30 AM  Result Value Ref Range Status   MRSA by PCR NEGATIVE NEGATIVE Final    Comment:        The GeneXpert MRSA Assay (FDA approved for NASAL specimens only), is one component of a comprehensive MRSA colonization surveillance program. It is not intended to diagnose MRSA infection nor to guide or monitor treatment for MRSA infections.   Body fluid culture     Status: None   Collection Time: 06/10/15  3:30 PM  Result Value Ref Range Status   Specimen Description PLEURAL  Final   Special Requests Normal  Final   Gram Stain RARE WBC SEEN NO ORGANISMS SEEN   Final   Culture NO ANAEROBES ISOLATED NO GROWTH 4 DAYS   Final   Report Status 06/14/2015 FINAL  Final     Labs: Basic Metabolic Panel:  Recent Labs Lab 06/11/15 0448 06/12/15 0553  06/13/15 0515 06/14/15 0500 06/15/15 0602  NA 142 140 141 140 137  K 4.6 5.0 4.9 5.1 5.5*  CL 105 103 104 103 102  CO2 33* 32 33* 32 31  GLUCOSE 108* 123* 107* 98 92  BUN 36* 33* 36* 40* 40*  CREATININE 1.05* 1.06* 1.19* 1.09* 1.21*  CALCIUM 8.7* 8.0* 8.8* 8.7* 8.5*   Liver Function Tests: No results for input(s): AST, ALT, ALKPHOS, BILITOT, PROT, ALBUMIN in the last 168 hours. No results for input(s): LIPASE, AMYLASE in the last 168 hours. No results for input(s): AMMONIA in the last 168 hours. CBC:  Recent Labs Lab 06/11/15 0448 06/12/15 0553 06/13/15 0515 06/14/15 0500 06/15/15 0602  WBC 3.4* 3.6 4.0 4.1 4.3  NEUTROABS 2.3 2.4 2.5 2.4 2.7  HGB 7.9* 8.4* 8.6* 8.3* 8.4*  HCT 24.1* 25.9* 25.2* 25.7* 25.0*  MCV 80.2 78.8* 78.4* 78.9* 78.4*  PLT 217 226 217 226 230   Cardiac Enzymes: No results for input(s): CKTOTAL, CKMB, CKMBINDEX, TROPONINI in the last 168 hours. BNP: BNP (last 3 results)  Recent Labs  12/23/14 0434 05/15/15 1233 06/07/15 1939  BNP 213.8* 560.0* 941.0*    ProBNP (last 3 results) No results for input(s): PROBNP in the last 8760 hours.  CBG:  Recent Labs Lab 06/14/15 1159 06/14/15 1650 06/14/15 2134 06/15/15 0754 06/15/15 1132  GLUCAP 130* 111* 112* 91 134*       Signed:  Kaysia Willard   06/15/2015, 1:04 PM

## 2015-06-15 NOTE — Care Management Important Message (Signed)
Important Message  Patient Details  Name: Regina Burke MRN: [id number] Date of Birth: 03/23/1933   Medicare Important Message Given:  Yes-second notification given    Juliann Pulse A Allmond 06/15/2015, 10:07 AM

## 2015-06-15 NOTE — Progress Notes (Signed)
PROGRESS NOTE  Regina SARCHET QQI:[id number] DOB: 03-31-1933 DOA: 06/07/2015 PCP: Tracie Harrier, MD  Subjective 81 y/o f with hx of Chronic A-fib, Ca  Lung, Chronic  Hypoxemic and Hypercapnic Respiratory Failure, HTN, OSA, Type 2 DM admitted with  Hyperkalemia , acute Renal failure , A-fib with RVR CT Chest Bilat Pleural effusions- Rt worse than left with compressive atelectasis and Rt upper lobe nodule with radiation fibrosis.. Pt is feeling much improved after thoracentesis  Hgb is stable at 8.4    Objective: BP 112/41 mmHg  Pulse 75  Temp(Src) 97.9 F (36.6 C) (Oral)  Resp 18  Ht '5\' 5"'$  (1.651 m)  Wt 73.483 kg (162 lb)  BMI 26.96 kg/m2  SpO2 99%  Intake/Output Summary (Last 24 hours) at 06/15/15 1249 Last data filed at 06/15/15 0901  Gross per 24 hour  Intake    120 ml  Output    351 ml  Net   -231 ml   Filed Weights   06/07/15 1914  Weight: 73.483 kg (162 lb)    Exam:  General: Pale looking. Dyspneic at rest  HEENT: PERRL; OP moist without lesions. Neck: supple, trachea midline, no thyromegaly Chest: normal to palpation Lungs: Decreased air entry bilat  Cardiovascular:Irregular rate  Abdomen: soft, nontender, nondistended, positive bowel sounds Extremities: no clubbing, cyanosis, edema Neuro: alert and oriented, moves all extremities Derm: Pressure ulcer left butttock Lymph: no cervical or supraclavicular lymphadenopathy   Labs and imaging studies were reviewed*  Data Reviewed: Basic Metabolic Panel:  Recent Labs Lab 06/11/15 0448 06/12/15 0553 06/13/15 0515 06/14/15 0500 06/15/15 0602  NA 142 140 141 140 137  K 4.6 5.0 4.9 5.1 5.5*  CL 105 103 104 103 102  CO2 33* 32 33* 32 31  GLUCOSE 108* 123* 107* 98 92  BUN 36* 33* 36* 40* 40*  CREATININE 1.05* 1.06* 1.19* 1.09* 1.21*  CALCIUM 8.7* 8.0* 8.8* 8.7* 8.5*   Liver Function Tests: No results for input(s): AST, ALT, ALKPHOS, BILITOT, PROT, ALBUMIN in the last 168 hours. No results for  input(s): LIPASE, AMYLASE in the last 168 hours. No results for input(s): AMMONIA in the last 168 hours. CBC:  Recent Labs Lab 06/11/15 0448 06/12/15 0553 06/13/15 0515 06/14/15 0500 06/15/15 0602  WBC 3.4* 3.6 4.0 4.1 4.3  NEUTROABS 2.3 2.4 2.5 2.4 2.7  HGB 7.9* 8.4* 8.6* 8.3* 8.4*  HCT 24.1* 25.9* 25.2* 25.7* 25.0*  MCV 80.2 78.8* 78.4* 78.9* 78.4*  PLT 217 226 217 226 230   Cardiac Enzymes:   No results for input(s): CKTOTAL, CKMB, CKMBINDEX, TROPONINI in the last 168 hours. BNP (last 3 results)  Recent Labs  12/23/14 0434 05/15/15 1233 06/07/15 1939  BNP 213.8* 560.0* 941.0*    ProBNP (last 3 results) No results for input(s): PROBNP in the last 8760 hours.  CBG:  Recent Labs Lab 06/14/15 1159 06/14/15 1650 06/14/15 2134 06/15/15 0754 06/15/15 1132  GLUCAP 130* 111* 112* 91 134*    Recent Results (from the past 240 hour(s))  MRSA PCR Screening     Status: None   Collection Time: 06/08/15 12:30 AM  Result Value Ref Range Status   MRSA by PCR NEGATIVE NEGATIVE Final    Comment:        The GeneXpert MRSA Assay (FDA approved for NASAL specimens only), is one component of a comprehensive MRSA colonization surveillance program. It is not intended to diagnose MRSA infection nor to guide or monitor treatment for MRSA infections.   Body fluid culture  Status: None   Collection Time: 06/10/15  3:30 PM  Result Value Ref Range Status   Specimen Description PLEURAL  Final   Special Requests Normal  Final   Gram Stain RARE WBC SEEN NO ORGANISMS SEEN   Final   Culture NO ANAEROBES ISOLATED NO GROWTH 4 DAYS   Final   Report Status 06/14/2015 FINAL  Final     Studies: No results found.  Scheduled Meds: . antiseptic oral rinse  7 mL Mouth Rinse q12n4p  . aspirin EC  81 mg Oral Daily  . atorvastatin  10 mg Oral QHS  . chlorhexidine  15 mL Mouth Rinse BID  . cholecalciferol  400 Units Oral Daily  . clopidogrel  75 mg Oral Daily  . diltiazem  300  mg Oral Daily  . diphenhydrAMINE  12.5 mg Intravenous Once  . famotidine  20 mg Oral Daily  . febuxostat  40 mg Oral Daily  . ferrous sulfate  325 mg Oral Daily  . heparin  5,000 Units Subcutaneous 3 times per day  . insulin aspart  0-5 Units Subcutaneous QHS  . insulin aspart  0-9 Units Subcutaneous TID WC  . metoprolol succinate  100 mg Oral Daily  . multivitamin-lutein  1 capsule Oral Daily  . sertraline  25 mg Oral Daily  . sodium chloride  3 mL Intravenous Q12H  . vitamin B-12  500 mcg Oral Daily  . vitamin C  500 mg Oral Daily    Continuous Infusions: . sodium chloride 10 mL/hr at 06/09/15 1503    Assessment/Plan:   1 Atrial fibrillation, with RVR; Now converted to SR-on Diltiazem   2  Hypercapnic and Hypoxemic respiratory failure- On BIPAP and supplemental O2  3 Acute kidney injury ([place]); Improving- Se Creat;1.05  4 Hyperkalemia; Improved with KayexalateContinue to monitor  5  Bilat pleural effusions  S/p Thoracentesis-bilat   Awaiting results of cytology of pleural fluid  6 Anemia; Hgb  is stable- 8.4 -Continue to monitor  7 Acute on Chronic diastolic CHF;Continue Lasix   8 Type 2 DM; On SSI  9 Physical therapy- For rehab soon  Code Status: Pt is DNR  Family Communication: son and daughter        06/15/2015, 12:49 PM  LOS: 8 days

## 2015-06-15 NOTE — Progress Notes (Signed)
Palliative Medicine Inpatient Consult Follow Up Note   Name: Regina Burke Date: 06/15/2015 MRN: [id number]  DOB: 1933/04/27  Referring Physician: Tracie Harrier, MD  Palliative Care consult requested for this 81 y.o. female for goals of medical therapy in patient with respiratory failure.   TODAY'S DISCUSSIONS AND DECISIONS:  1.  She is to continue as DNR and portable copy of DNR form is in the record.  She would be OK with elective intubation and ventilation if it would help --but not in an impending code situation.    2.  Her HCPOA is son, [name].  No one else. Pt can speak her own mind, but sometimes she can make some misleading statements. If there is any question about pt's capacity to make decisions, consult ONLY the son, [name] (or the alternate listed on the HCPOA form if [name] cannot be reached).    3.  Pt agrees with SNF for rehab and needs this.  4.  Pt's cytology has come back this afternoon and it is negative for cancer cells  5.  Pt NEEDS A PALLIATIVE CARE CONSULT --PLEASE TYPE THIS INTO DISCHARGE SUMMARY.  6.  Pt would be interested in Hospice in her home when she goes home. BUT she plans on living a full life at home (cooking etc) and does not want to 'lie down and just die'.  But, son is very interested in her having Hospice Services when she goes home if she is eligible (for lung / heart disease) at the time when she goes home from rehab.  7.  Family dynamics and conflict exist but patient is able to say who she wants involved in her affairs etc. The HCPOA should be first contact.  The other adult children should INFORMED --NOT ASKED.  They should not be excluded from getting information, but when it comes to driving decisions and providing info, that should come from son, [name], and pt herself, of course.    IMPRESSION: 1. Acute on chronic respiratory failure (hypoxic and hypercapneic) ---likely largely related to bilateral pleural effusions of unclear  etiology ---she had a right thoracentesis on 10/13 and improved markedly after that ---would encourage a left thoracentesis if attending agrees as it has been suggested by others as well ---cytology is pending, but oncology does not feel there are signs of lung cancer recurrence  2. COPD --advanced but reportedly stable at home (until recently) on 3 LPM oxygen ---scheduled nebs are DCd  3. Acute on chronic Diastolic CHF  ---Echo in September showed an Ef of 55-60% 4. Essential HTN 5. Afib with RVR --was on cardizem drip now [facility] --got digoxin now [facility] due to Dig toxicity times two 6. DM2 7. H/O Lung Cancer (adenocarciinoma with lepidin pattern) ---s/p XRT 2015 ---no obvious sign of recurrence per oncology consult ---nodule RUL ---radiation fibrosis is noted 8. Dyslipidemia 9. Cushing's Dz (s/p adrenalectomy 1980's) 10.Depression 11. Gout 12. DJD 13. Acute on Chronic renal failure ---CKD stage III 14. Anemia --severe and transfused ---iron and B12 levels wnl  ---unclear cause ---transfused for Hgb of 6.7 15. CAD with MI and stent in past 99. GERD 17. Hyperkalemia --treated  18. Poor appetite.     CODE STATUS: DNR   PAST MEDICAL HISTORY: Past Medical History  Diagnosis Date  . COPD (chronic obstructive pulmonary disease) ([place])   . CHF (congestive heart failure) ([place])   . Hypertension   . Rapid palpitations 2014    Seen at [facility], may have been atrial fibrillation  .  DOE (dyspnea on exertion)     Started after treatment for lung cancer  . High cholesterol   . Heart murmur   . Myocardial infarction ([place]) 12/22/2014   . Lung cancer ([place]) dx'd 2014    S/P radiation 2015  . Sleep apnea   . Cushing's disease ([place])   . Type II diabetes mellitus ([place])   . Anemia   . GERD (gastroesophageal reflux disease)   . History of hiatal hernia   . Migraine   . Arthritis   . Gout   . Depression   . Chronic kidney disease (CKD), stage III (moderate)     PAST  SURGICAL HISTORY:  Past Surgical History  Procedure Laterality Date  . Adrenalectomy Left 1980's    "[place]"  . Abdominal hysterectomy    . Appendectomy    . Cholecystectomy    . Coronary angioplasty with stent placement  12/23/2014  . Tonsillectomy    . Fracture surgery    . Wrist fracture surgery Bilateral ~ 2000  . Breast cyst excision Left   . Tubal ligation    . Left heart catheterization with coronary angiogram N/A 12/23/2014    Procedure: LEFT HEART CATHETERIZATION WITH CORONARY ANGIOGRAM;  Surgeon: Burnell Blanks, MD;  Location: [facility] CATH LAB;  Service: Cardiovascular;  Laterality: N/A;  . Percutaneous coronary stent intervention (pci-s)  12/23/2014    Procedure: PERCUTANEOUS CORONARY STENT INTERVENTION (PCI-S);  Surgeon: Burnell Blanks, MD;  Location: [facility] CATH LAB;  Service: Cardiovascular;;  Promus 2.25x8    Vital Signs: BP 112/41 mmHg  Pulse 75  Temp(Src) 97.9 F (36.6 C) (Oral)  Resp 18  Ht '5\' 5"'$  (1.651 m)  Wt 73.483 kg (162 lb)  BMI 26.96 kg/m2  SpO2 99% Filed Weights   06/07/15 1914  Weight: 73.483 kg (162 lb)    Estimated body mass index is 26.96 kg/(m^2) as calculated from the following:   Height as of this encounter: '5\' 5"'$  (1.651 m).   Weight as of this encounter: 73.483 kg (162 lb).  PHYSICAL EXAM: Awake, alert and able to speak her mind EOMI OP clear Neck w/o JVD or TM Hrt rrr no mgr Lungs sound clear today Abd soft and NT Ext no mottling or cyanosis  LABS: CBC:    Component Value Date/Time   WBC 4.3 06/15/2015 0602   WBC 7.6 12/09/2014 1325   HGB 8.4* 06/15/2015 0602   HGB 11.7* 12/09/2014 1325   HCT 25.0* 06/15/2015 0602   HCT 36.1 12/09/2014 1325   PLT 230 06/15/2015 0602   PLT 226 12/09/2014 1325   MCV 78.4* 06/15/2015 0602   MCV 78* 12/09/2014 1325   NEUTROABS 2.7 06/15/2015 0602   NEUTROABS 6.0 12/09/2014 1325   LYMPHSABS 1.0 06/15/2015 0602   LYMPHSABS 0.9* 12/09/2014 1325   MONOABS 0.4 06/15/2015 0602   MONOABS  0.4 12/09/2014 1325   EOSABS 0.2 06/15/2015 0602   EOSABS 0.2 12/09/2014 1325   BASOSABS 0.0 06/15/2015 0602   BASOSABS 0.1 12/09/2014 1325   Comprehensive Metabolic Panel:    Component Value Date/Time   NA 137 06/15/2015 0602   NA 134* 12/09/2014 1325   K 5.5* 06/15/2015 0602   K 4.5 12/09/2014 1325   CL 102 06/15/2015 0602   CL 105 12/09/2014 1325   CO2 31 06/15/2015 0602   CO2 23 12/09/2014 1325   BUN 40* 06/15/2015 0602   BUN 31* 12/09/2014 1325   CREATININE 1.21* 06/15/2015 0602   CREATININE 1.27* 12/09/2014 1325  GLUCOSE 92 06/15/2015 0602   GLUCOSE 181* 12/09/2014 1325   CALCIUM 8.5* 06/15/2015 0602   CALCIUM 8.7* 12/09/2014 1325   AST 15 06/07/2015 1939   AST 25 12/09/2014 1325   ALT 14 06/07/2015 1939   ALT 16 12/09/2014 1325   ALKPHOS 60 06/07/2015 1939   ALKPHOS 44 12/09/2014 1325   BILITOT 0.7 06/07/2015 1939   BILITOT 0.6 12/09/2014 1325   PROT 6.9 06/07/2015 1939   PROT 6.8 12/09/2014 1325   ALBUMIN 3.5 06/07/2015 1939   ALBUMIN 3.9 12/09/2014 1325      REFERRALS TO BE ORDERED:  PALLIATIVE CARE CONSULT IN THE FACILITY (So pt can be considererd to transfer to hospice in her own home after DC from the facility)   More than 50% of the visit was spent in counseling/coordination of care: YES  Time Spent:  45 min

## 2015-06-15 NOTE — Progress Notes (Signed)
Patient PICC  line was removed intact and pressure dsg apply, no active bleeding noted at this time , will continue to monitor , report given to floor nurse Loye Reininger Butts . EMS called for pick up

## 2015-06-15 NOTE — Progress Notes (Signed)
Patient: Regina Burke / Admit Date: 06/07/2015 / Date of Encounter: 06/15/2015, 8:46 AM   Subjective: Feels better today, improved SOB after thoracentesis x2 Eating well.  Review of telemetry shows she is in normal sinus rhythm, rate 90  Review of Systems: ROS Constitutional: Positive for malaise/fatigue.  Respiratory: Positive for mild shortness of breath Cardiovascular: Negative.  Gastrointestinal: Negative.  Musculoskeletal: Negative.  Neurological: Positive for weakness.  Psychiatric/Behavioral: Negative All other systems reviewed and are negative.  Objective: Telemetry: Telemetry confirming normal sinus rhythm Physical Exam: Blood pressure 111/56, pulse 73, temperature 98.2 F (36.8 C), temperature source Oral, resp. rate 18, height '5\' 5"'$  (1.651 m), weight 162 lb (73.483 kg), SpO2 98 %. Body mass index is 26.96 kg/(m^2). General: Well developed, well nourished, Short of breath with talking  Head: Normocephalic, atraumatic, sclera non-icteric, no xanthomas, nares are without discharge. Neck: Negative for carotid bruits. JVD not elevated. Heart:  RRR with 2+ murmurs right sternal border  Lungs : Decreased breath sounds bilaterally throughout Abdomen: Soft, non-tender, non-distended with normoactive bowel sounds. No hepatomegaly. No rebound/guarding. No obvious abdominal masses. Msk: Strength and tone appear normal for age. Extremities: No clubbing or cyanosis. No edema. Distal pedal pulses are 2+ and equal bilaterally. Neuro: Alert and oriented X 3. No facial asymmetry. No focal deficit. Moves all extremities spontaneously. Psych: Responds to questions appropriately with a normal affect.   Intake/Output Summary (Last 24 hours) at 06/15/15 0846 Last data filed at 06/15/15 0500  Gross per 24 hour  Intake    600 ml  Output    650 ml  Net    -50 ml    Inpatient Medications:  . antiseptic oral rinse  7 mL Mouth Rinse q12n4p  . aspirin EC  81 mg  Oral Daily  . atorvastatin  10 mg Oral QHS  . chlorhexidine  15 mL Mouth Rinse BID  . cholecalciferol  400 Units Oral Daily  . clopidogrel  75 mg Oral Daily  . diltiazem  300 mg Oral Daily  . diphenhydrAMINE  12.5 mg Intravenous Once  . famotidine  20 mg Oral Daily  . febuxostat  40 mg Oral Daily  . ferrous sulfate  325 mg Oral Daily  . heparin  5,000 Units Subcutaneous 3 times per day  . insulin aspart  0-5 Units Subcutaneous QHS  . insulin aspart  0-9 Units Subcutaneous TID WC  . metoprolol succinate  100 mg Oral Daily  . multivitamin-lutein  1 capsule Oral Daily  . sertraline  25 mg Oral Daily  . sodium chloride  3 mL Intravenous Q12H  . vitamin B-12  500 mcg Oral Daily  . vitamin C  500 mg Oral Daily   Infusions:  . sodium chloride 10 mL/hr at 06/09/15 1503    Labs:  Recent Labs  06/14/15 0500 06/15/15 0602  NA 140 137  K 5.1 5.5*  CL 103 102  CO2 32 31  GLUCOSE 98 92  BUN 40* 40*  CREATININE 1.09* 1.21*  CALCIUM 8.7* 8.5*   No results for input(s): AST, ALT, ALKPHOS, BILITOT, PROT, ALBUMIN in the last 72 hours.  Recent Labs  06/14/15 0500 06/15/15 0602  WBC 4.1 4.3  NEUTROABS 2.4 2.7  HGB 8.3* 8.4*  HCT 25.7* 25.0*  MCV 78.9* 78.4*  PLT 226 230   No results for input(s): CKTOTAL, CKMB, TROPONINI in the last 72 hours. Invalid input(s): POCBNP No results for input(s): HGBA1C in the last 72 hours.   Weights: [employer]  06/07/15 1914  Weight: 162 lb (73.483 kg)     Radiology/Studies:  Dg Chest 1 View  06/14/2015  CLINICAL DATA:  Status post left-sided thoracentesis. EXAM: CHEST 1 VIEW COMPARISON:  June 12, 2015. FINDINGS: Stable cardiomediastinal silhouette. No pneumothorax is noted. Minimal right pleural effusion is noted. Right-sided PICC line is noted with tip in expected position of SVC. Left pleural effusion is resolved status post thoracentesis. IMPRESSION: No pneumothorax status post left-sided thoracentesis. Left pleural effusion is  resolved. Electronically Signed   By: Marijo Conception, M.D.   On: 06/14/2015 10:54   Dg Chest 1 View  06/12/2015  CLINICAL DATA:  Follow-up pleural effusion EXAM: CHEST 1 VIEW COMPARISON:  06/10/2015 FINDINGS: Layering moderate left pleural effusion, increased. Small right pleural effusion, increased. Mild perihilar edema.  No pneumothorax. Right arm PICC terminates at the cavoatrial junction Cardiomegaly. Surgical clips overlying the medial right upper chest. IMPRESSION: Layering moderate left and small right pleural effusions, increased. Cardiomegaly with mild perihilar edema. Electronically Signed   By: Julian Hy M.D.   On: 06/12/2015 10:04   Dg Chest 1 View  06/10/2015  CLINICAL DATA:  Status post right thoracentesis EXAM: CHEST  1 VIEW COMPARISON:  06/09/2015 FINDINGS: There is been near complete resolution of the right-sided pleural effusion following thoracentesis. Adequate re-expansion of the lung is noted with no evidence of pneumothorax. Persistent left-sided pleural effusion is seen. Cardiac shadow is stable. A right-sided PICC line is stable. IMPRESSION: No pneumothorax following right thoracentesis. If clinically desired and the left pleural effusion could be drained on 06/11/2015 Electronically Signed   By: Inez Catalina M.D.   On: 06/10/2015 16:00   Dg Chest 1 View  05/20/2015  CLINICAL DATA:  Shortness of breath. EXAM: CHEST  1 VIEW COMPARISON:  05/17/2015. FINDINGS: Left IJ line in good anatomic position. Mediastinum hilar structures normal. Cardiomegaly with bilateral pulmonary alveolar infiltrates and pleural effusions consistent congestive heart failure. Bilateral pneumonia cannot be excluded. No pneumothorax. Surgical clips right upper chest. IMPRESSION: 1. Lines and tubes in stable position. 2. Cardiomegaly with bilateral basilar pulmonary alveolar infiltrates and pleural effusions consistent with congestive heart failure. Bibasilar pneumonia cannot be excluded. Electronically  Signed   By: Marcello Moores  Register   On: 05/20/2015 07:15   Dg Abd 1 View  05/16/2015  CLINICAL DATA:  Encounter for orogastric tube placement. EXAM: ABDOMEN - 1 VIEW COMPARISON:  None. FINDINGS: Tip of the orogastric tube extends well below the left hemidiaphragm consistent with it being in the mid stomach. There are surgical vascular clips in left upper quadrant. Normal bowel gas pattern. IMPRESSION: Orogastric tube tip projects within the mid stomach. Electronically Signed   By: Lajean Manes M.D.   On: 05/16/2015 21:52   Ct Chest Wo Contrast  06/08/2015  CLINICAL DATA:  Personal history of adenocarcinoma of lung it diagnosed April 2015 and treated with radiation therapy, hospitalized in September with respiratory failure, now with increased dyspnea and central chest pain EXAM: CT CHEST WITHOUT CONTRAST TECHNIQUE: Multidetector CT imaging of the chest was performed following the standard protocol without IV contrast. COMPARISON:  06/07/2015, 06/01/2014 FINDINGS: There is moderate left pleural effusion. The left upper lobe is clear except for mild apical scarring. There is anticipated compressive dependent atelectasis on the left. There is a moderate partially loculated pleural effusion on the right. There is more extensive presumably compressive atelectasis at the right lung base as compared to the left. There is postsurgical change with evidence of radiation fibrosis medially in  the right upper lobe. Beam attenuation artifact is present as the result of right apical clips, and obscures underlying parenchyma. There is a 7 x 40m central opacity that may represent a combination of fibrosis and residual tumor. Limited study without contrast shows no significant hilar or mediastinal adenopathy. There is cardiac enlargement. There is no significant pericardial effusion. There is coronary artery calcification as well as aortic calcification. No abnormalities involving the airway. Thoracic inlet and thyroid are  normal. There are no acute musculoskeletal findings. Images to the upper abdomen partially demonstrate a known right renal cyst as well as a right adrenal mass. Large low-attenuation splenic lesion again identified. Hyper attenuating left renal lesion partially visualized and shows no change from prior study. IMPRESSION: Significant bilateral pleural effusions right greater than left with bilateral compressive atelectasis right worse than left. Postoperative change and known nodule right upper lobe with radiation fibrosis. Multiple abnormalities in the upper abdomen unchanged from prior study. Electronically Signed   By: RSkipper ClicheM.D.   On: 06/08/2015 17:02   [place]al  06/08/2015  CLINICAL DATA:  Acute renal injury EXAM: RENAL / URINARY TRACT ULTRASOUND COMPLETE COMPARISON:  None. FINDINGS: Right Kidney: Length: 10.6 cm. Echogenicity within normal limits. No hydronephrosis visualized. There are several cysts in the right kidney, largest in the lower pole right kidney measuring 2.6 x 1.9 x 2.5 cm. The midpole right kidney cyst is complex measuring 2.3 x 2.1 x 2.2 cm Left Kidney: Length: 10.6 cm. Echogenicity within normal limits. No hydronephrosis visualized. There are small cysts within the left kidney, largest measures 1.4 x 1.4 x 1.6 cm in the lower pole. Bladder: The bladder is not visualized. Patient has a Foley catheter in place. There is a complex cyst in the spleen measuring 5 x 5.1 x 5.3 cm. There is a left pleural effusion. IMPRESSION: Complex cyst in the midpole right kidney. Further evaluation with three-phase renal CT is recommended to exclude solid component. Simple cysts in both kidneys.  No hydronephrosis bilaterally. Complex cyst identified in the spleen. Left pleural effusion. Electronically Signed   By: WAbelardo DieselM.D.   On: 06/08/2015 16:56   Dg Chest Port 1 View  06/09/2015  CLINICAL DATA:  Status post central line placement. EXAM: PORTABLE CHEST 1 VIEW COMPARISON:  Same day.  FINDINGS: Stable cardiomegaly. No pneumothorax is noted. Stable bilateral pleural effusions are noted with probable underlying atelectasis. Bony thorax is unremarkable. Right-sided PICC line has been repositioned, with distal tip in expected position of the SVC. IMPRESSION: Stable cardiomegaly and bilateral pleural effusions are noted with probable underlying atelectasis. Right-sided PICC line has been repositioned with distal tip in expected position of SVC. Electronically Signed   By: JMarijo Conception M.D.   On: 06/09/2015 16:25   Dg Chest Port 1 View  06/09/2015  CLINICAL DATA:  PICC line placement EXAM: PORTABLE CHEST 1 VIEW COMPARISON:  06/07/2015 FINDINGS: Right PICC line is in place. The tip is in the upper right atrium approximately 3 cm deep to the cavoatrial junction. Cardiomegaly. Bilateral perihilar and lower lobe opacities with layering effusions are similar to prior study. IMPRESSION: Right PICC line tip in the upper right atrium 3 cm deep to the cavoatrial junction. No change in the appearance of the chest otherwise. Electronically Signed   By: KRolm BaptiseM.D.   On: 06/09/2015 16:20   Dg Chest Portable 1 View  06/07/2015  CLINICAL DATA:  Wheezing and cough, shortness of breath. History of COPD, CHF, hypertension,  lung cancer in 2014. EXAM: PORTABLE CHEST 1 VIEW COMPARISON:  Chest x-ray dated 05/20/2015. FINDINGS: Cardiomediastinal silhouette appears stable in size and configuration. Cardiomegaly is grossly unchanged. Again noted is central pulmonary vascular congestion and bibasilar airspace opacities. Overall appearance of the lungs is unchanged. Left-sided central line has been removed in the interval. Surgical changes again seen over the right upper lung. IMPRESSION: Overall stable appearance of the chest x-ray. Again noted is cardiomegaly with central pulmonary vascular congestion suggesting volume overload/CHF (persistent versus recurrent). Bibasilar opacities are also unchanged and  probably represent a combination of atelectasis and bibasilar pulmonary edema with adjacent pleural effusions. As also stated on the previous report, bibasilar pneumonia cannot be confidently excluded. Electronically Signed   By: Franki Cabot M.D.   On: 06/07/2015 19:58   Dg Chest Port 1 View  05/17/2015  CLINICAL DATA:  Respiratory failure. EXAM: PORTABLE CHEST - 1 VIEW COMPARISON:  None. FINDINGS: Endotracheal tube, NG line, left IJ line in stable position. Cardiomegaly with pulmonary vascular prominence and bilateral interstitial prominence consistent with congestive heart failure. Interval partial clearing of pulmonary interstitial edema. Small bilateral pleural effusions. Surgical clips right upper abdomen. IMPRESSION: 1. Lines and tubes in stable position. 2. Congestive heart failure with partial clearing of bilateral pulmonary interstitial edema. Small bilateral pleural effusions Electronically Signed   By: [place]   On: 05/17/2015 08:54   Dg Chest Port 1 View  05/16/2015  CLINICAL DATA:  Post intubation.  Central line placement. EXAM: PORTABLE CHEST - 1 VIEW COMPARISON:  05/15/2015 FINDINGS: Endotracheal tube placed with tip measuring 3.6 cm above the carina. Enteric tube tip is off the field of view but below the left hemidiaphragm. Left central venous catheter tip is over the low SVC region. No pneumothorax. Normal heart size and pulmonary vascularity. Some prominence of the central pulmonary arteries. Diffuse infiltration in the right lung increasing since previous study. This could be due to pneumonia or asymmetrical edema. Probable small bilateral pleural effusions and basilar atelectasis. Calcified and tortuous aorta. Surgical clips in the right upper chest and upper abdomen. IMPRESSION: Appliances appear in satisfactory position. Increasing infiltration in the right lung suggesting pneumonia or asymmetric edema. Small bilateral pleural effusions with basilar atelectasis. Electronically  Signed   By: Lucienne Capers M.D.   On: 05/16/2015 21:48   US Thoracentesis Asp Pleural Space W/img Guide  06/14/2015  CLINICAL DATA:  Left pleural effusion. EXAM: ULTRASOUND GUIDED left THORACENTESIS COMPARISON:  June 10, 2015. PROCEDURE: An ultrasound guided thoracentesis was thoroughly discussed with the patient and questions answered. The benefits, risks, alternatives and complications were also discussed. The patient understands and wishes to proceed with the procedure. Written consent was obtained. Ultrasound was performed to localize and mark an adequate pocket of fluid in the left chest. The area was then prepped and draped in the normal sterile fashion. 1% Lidocaine was used for local anesthesia. Under ultrasound guidance a Safe-T-Centesis catheter was introduced. Thoracentesis was performed. The catheter was removed and a dressing applied. Complications:  None immediate. FINDINGS: A total of approximately 800 mL of serous fluid was removed. A fluid sample was notsent for laboratory analysis. IMPRESSION: Successful ultrasound guided left thoracentesis yielding 800 mL of pleural fluid. Electronically Signed   By: Marijo Conception, M.D.   On: 06/14/2015 10:56   US Thoracentesis Asp Pleural Space W/img Guide  06/10/2015  CLINICAL DATA:  Bilateral pleural effusions EXAM: ULTRASOUND GUIDED RIGHT THORACENTESIS PROCEDURE: An ultrasound guided thoracentesis was thoroughly discussed with the  patient and questions answered. The benefits, risks, alternatives and complications were also discussed. The patient understands and wishes to proceed with the procedure. Written consent was obtained. Ultrasound was performed to localize and mark an adequate pocket of fluid in the right chest. The area was then prepped and draped in the normal sterile fashion. 1% Lidocaine was used for local anesthesia. Under ultrasound guidance a 6 French thoracentesis catheter was introduced. Thoracentesis was performed. The  catheter was removed and a dressing applied. COMPLICATIONS: None FINDINGS: A total of approximately 1.1 L of bloody fluid was removed. A fluid sample was sent for laboratory analysis. IMPRESSION: Successful ultrasound guided right thoracentesis yielding 1.1 L of bloody pleural fluid. Patient has a moderate to large effusion on the left which could be drained on 06/11/2015 if clinically desired. Electronically Signed   By: Inez Catalina M.D.   On: 06/10/2015 16:32     Assessment and Plan  81 y.o. female with past medical history including coronary artery disease, paroxysmal atrial fibrillation, COPD, lung cancer, diabetes mellitus, and admitted to [facility] hospital on 12/22/2014 with chest pain, elective cardiac catheterization found to have mild 50% disease in the Atrial fibrillationdiagonal branch of the LAD, but high-grade 90% hazy stenosis in the marginal branch of the circumflex, successful PTCA and insertion of a 2.25x 8 millimeter Promus premier DES stent at the ostium of the OM3 vessel, started on aspirin and Plavix, hospital admission in August for electrolyte abnormality, digoxin toxicity, readmitted 06/07/2015 with shortness of breath, found to have atrial fibrillation , potassium of 7, renal failure .   rapid decline in her blood count, currently receiving packed red blood cells this evening .   she is on BiPAP at nighttime . Potassium level has improved down to 5.2, creatinine still elevated 1.7 with elevated BUN  drop in her blood count down to hemoglobin 6.7   1) atrial fibrillation Converted to normal sinus rhythm after thoracentesis (first one) Possibly aided by improved oxygenation Breathing better after second thoracentesis  Still with mild shortness of breath likely from underlying anemia, COPD  Would continue diltiazem and beta blocker   2) respiratory distress Likely multifactorial including profound anemia, atrial fibrillation, severe COPD, pleural effusions . --Management of  COPD per pulmonary /steroids/bronchodilators --Will need Lasix as an outpatient ,  20 mg daily. Extra Lasix for weight gain more than 3 pounds   3) pleural effusions  Successful procedure  2 to prevent recurrence, will need to send home on Lasix  4) Digoxin toxicity Levelwould not restart digoxin at discharge  5) Anemia  Given 1 unit of blood, hematocrit 21 up to 24 - 25  given her underlying COPD, pleural effusion, still very short of breath  She is already taking iron  consider outpatient hematology   patient reports she used to have shots, possibly epo  6) acute on chronic diastolic CHF Would consider sending home on Lasix 20 mg daily, close monitoring of her weight I spent 30 minutes with CHF education with her today She will weigh herself daily at the rehabilitation, call our office for 3 pound weight gain  Signed, Esmond Plants, MD [facility] 06/15/2015, 8:46 AM

## 2015-06-15 NOTE — Clinical Social Work Note (Signed)
CSW met with patient and son (also HPOA): [name]: [phone] in patient's room this afternoon. Patient tells CSW that she will return to [facility] as long as she does not have her old roommate. CSW verified that she will have a different roommate and also requested a private room once one becomes available. MD has rounded and discharged patient today to return to [facility]. [facility] is aware and has received discharge summary. [facility] is also reordering the bipap to their facility for today as well. Patient and son are both in agreement with discharge. Shela Leff MSW,LCSW [phone]

## 2015-06-15 NOTE — Progress Notes (Signed)
Patient had removed herself from [place], on 2LPM [place] 98%.  Bipap at bedside for use at night.

## 2015-06-15 NOTE — Care Management (Signed)
Attending will discharge patient to skilled nursing today.  Informed attending to include in the discharge summary the need for palliative care to follow.

## 2015-06-15 NOTE — Care Management (Signed)
Spoke with patient and her son [name].  Confirmed with patient  that she has home 02 through [facility].  Says that she does have a cpap machine "that I have not used in at least 5 years."  Did not like the mask.  [name] says that this is a bipap machine. Requested that he bring the machine to the unit so can determine type of machine.  Discussed that cpap machines are not the same as bipapp machines.  patient says that she has had a sleep study about 5-6 years ago to qualify for the cpap machine.  Discussed that she may require another sleep study if attending feels that she now needs bipap.   Patient is agreeable to skilled nursing and returning to [facility] but would like a private room. Updated CSW.  Anticipate discharge within the next 24 hours

## 2015-06-15 NOTE — Progress Notes (Signed)
Physical Therapy Treatment Patient Details Name: Regina Burke MRN: [id number] DOB: 1932-11-13 Today's Date: 06/15/2015    History of Present Illness Pt was admitted to the hospital with increased potassium, A-fib, and rapid ventricular response. Pt experiencing SOB; had thoracentesis performed 06/10/15    PT Comments    Pt presented long sit in bed, request to use bathroom, demostrated mod I with bed mobility requiring add'l time.  Observed transfer to BR with RW, F+ balance and no LOB.  Able to trf to toilet with supervision and standing tolerance 3 min at sink.  Starting SpO2 99% on 2L O2 HR 52, pt ambulated approx 15f with RW step through patter and decreased cadence.  No significant exertion noted adv therapeutic break if necessary.  Pt returned to room SpO2 97% HR 102 after activity.  Pt indicated fatigue request no further activity this session.  She would continue to benefit from skilled PT to improve endurance for safety with functional activity tolerance.    Follow Up Recommendations  SNF     Equipment Recommendations  None recommended by PT    Recommendations for Other Services       Precautions / Restrictions Precautions Precautions: Fall Precaution Comments: Pt has fallen several times recently. Restrictions Weight Bearing Restrictions: No    Mobility  Bed Mobility Overal bed mobility: Modified Independent (add'l time) Bed Mobility: Supine to Sit;Sit to Supine     Supine to sit: Modified independent (Device/Increase time) Sit to supine: Modified independent (Device/Increase time)   General bed mobility comments: Pt able to scoot self/repostion with rest breaks  Transfers Overall transfer level: Needs assistance Equipment used: Rolling walker (2 wheeled) Transfers: Sit to/from Stand Sit to Stand: Supervision         General transfer comment: Able to trf from bed and toilet at various levels  Ambulation/Gait Ambulation/Gait assistance: Min  guard Ambulation Distance (Feet): 180 Feet Assistive device: Rolling walker (2 wheeled) Gait Pattern/deviations: Step-through pattern;Shuffle     General Gait Details: min cues for posture and pacing   Stairs            Wheelchair Mobility    Modified Rankin (Stroke Patients Only)       Balance Overall balance assessment: History of Falls                                  Cognition Arousal/Alertness: Awake/alert Behavior During Therapy: WFL for tasks assessed/performed Overall Cognitive Status: Within Functional Limits for tasks assessed                      Exercises      General Comments        Pertinent Vitals/Pain Pain Assessment: No/denies pain    Home Living                      Prior Function            PT Goals (current goals can now be found in the care plan section) Acute Rehab PT Goals Patient Stated Goal: to return to rehab PT Goal Formulation: With patient Time For Goal Achievement: 06/25/15 Potential to Achieve Goals: Good    Frequency  Min 2X/week    PT Plan      Co-evaluation             End of Session Equipment Utilized During Treatment: Gait belt Activity Tolerance: Patient  tolerated treatment well;Patient limited by fatigue Patient left: in bed;with family/visitor present     Time: 1030-1051 PT Time Calculation (min) (ACUTE ONLY): 21 min  Charges:  $Therapeutic Activity: 8-22 mins                    G Codes:      Regina Burke 11-Jul-2015, 10:58 AM  Regina Burke, PTA

## 2015-07-05 ENCOUNTER — Ambulatory Visit: Payer: Medicare Other | Attending: Internal Medicine

## 2015-07-05 DIAGNOSIS — G4733 Obstructive sleep apnea (adult) (pediatric): Secondary | ICD-10-CM | POA: Diagnosis not present

## 2015-07-12 ENCOUNTER — Inpatient Hospital Stay: Payer: Medicare Other

## 2015-07-12 ENCOUNTER — Inpatient Hospital Stay: Payer: Medicare Other | Admitting: Oncology

## 2015-07-13 ENCOUNTER — Telehealth: Payer: Self-pay | Admitting: *Deleted

## 2015-07-13 ENCOUNTER — Inpatient Hospital Stay: Payer: Medicare Other | Attending: Oncology | Admitting: Oncology

## 2015-07-13 ENCOUNTER — Inpatient Hospital Stay: Payer: Medicare Other

## 2015-07-13 VITALS — BP 137/71 | HR 76 | Temp 96.3°F | Wt 157.2 lb

## 2015-07-13 DIAGNOSIS — C3490 Malignant neoplasm of unspecified part of unspecified bronchus or lung: Secondary | ICD-10-CM

## 2015-07-13 DIAGNOSIS — I2510 Atherosclerotic heart disease of native coronary artery without angina pectoris: Secondary | ICD-10-CM | POA: Diagnosis not present

## 2015-07-13 DIAGNOSIS — I252 Old myocardial infarction: Secondary | ICD-10-CM | POA: Diagnosis not present

## 2015-07-13 DIAGNOSIS — E7800 Pure hypercholesterolemia, unspecified: Secondary | ICD-10-CM | POA: Insufficient documentation

## 2015-07-13 DIAGNOSIS — M109 Gout, unspecified: Secondary | ICD-10-CM | POA: Insufficient documentation

## 2015-07-13 DIAGNOSIS — G4730 Sleep apnea, unspecified: Secondary | ICD-10-CM | POA: Insufficient documentation

## 2015-07-13 DIAGNOSIS — Z79899 Other long term (current) drug therapy: Secondary | ICD-10-CM | POA: Diagnosis not present

## 2015-07-13 DIAGNOSIS — Z9889 Other specified postprocedural states: Secondary | ICD-10-CM | POA: Diagnosis not present

## 2015-07-13 DIAGNOSIS — K219 Gastro-esophageal reflux disease without esophagitis: Secondary | ICD-10-CM | POA: Insufficient documentation

## 2015-07-13 DIAGNOSIS — I509 Heart failure, unspecified: Secondary | ICD-10-CM | POA: Insufficient documentation

## 2015-07-13 DIAGNOSIS — Z7984 Long term (current) use of oral hypoglycemic drugs: Secondary | ICD-10-CM | POA: Diagnosis not present

## 2015-07-13 DIAGNOSIS — Z7982 Long term (current) use of aspirin: Secondary | ICD-10-CM | POA: Diagnosis not present

## 2015-07-13 DIAGNOSIS — C3411 Malignant neoplasm of upper lobe, right bronchus or lung: Secondary | ICD-10-CM | POA: Diagnosis not present

## 2015-07-13 DIAGNOSIS — Z923 Personal history of irradiation: Secondary | ICD-10-CM | POA: Insufficient documentation

## 2015-07-13 DIAGNOSIS — J449 Chronic obstructive pulmonary disease, unspecified: Secondary | ICD-10-CM | POA: Insufficient documentation

## 2015-07-13 DIAGNOSIS — I129 Hypertensive chronic kidney disease with stage 1 through stage 4 chronic kidney disease, or unspecified chronic kidney disease: Secondary | ICD-10-CM | POA: Diagnosis not present

## 2015-07-13 DIAGNOSIS — N183 Chronic kidney disease, stage 3 (moderate): Secondary | ICD-10-CM | POA: Insufficient documentation

## 2015-07-13 DIAGNOSIS — Z87891 Personal history of nicotine dependence: Secondary | ICD-10-CM | POA: Insufficient documentation

## 2015-07-13 DIAGNOSIS — E119 Type 2 diabetes mellitus without complications: Secondary | ICD-10-CM | POA: Insufficient documentation

## 2015-07-13 LAB — CBC WITH DIFFERENTIAL/PLATELET
BASOS PCT: 1 %
Basophils Absolute: 0.1 10*3/uL (ref 0–0.1)
EOS ABS: 0.3 10*3/uL (ref 0–0.7)
Eosinophils Relative: 4 %
HEMATOCRIT: 31.7 % — AB (ref 35.0–47.0)
Hemoglobin: 10.3 g/dL — ABNORMAL LOW (ref 12.0–16.0)
Lymphocytes Relative: 16 %
Lymphs Abs: 1 10*3/uL (ref 1.0–3.6)
MCH: 25.1 pg — AB (ref 26.0–34.0)
MCHC: 32.5 g/dL (ref 32.0–36.0)
MCV: 77.2 fL — ABNORMAL LOW (ref 80.0–100.0)
MONO ABS: 0.3 10*3/uL (ref 0.2–0.9)
MONOS PCT: 5 %
Neutro Abs: 4.9 10*3/uL (ref 1.4–6.5)
Neutrophils Relative %: 74 %
Platelets: 192 10*3/uL (ref 150–440)
RBC: 4.11 MIL/uL (ref 3.80–5.20)
RDW: 16.6 % — AB (ref 11.5–14.5)
WBC: 6.6 10*3/uL (ref 3.6–11.0)

## 2015-07-13 LAB — COMPREHENSIVE METABOLIC PANEL
ALBUMIN: 4.3 g/dL (ref 3.5–5.0)
ALK PHOS: 44 U/L (ref 38–126)
ALT: 19 U/L (ref 14–54)
ANION GAP: 6 (ref 5–15)
AST: 22 U/L (ref 15–41)
BILIRUBIN TOTAL: 0.6 mg/dL (ref 0.3–1.2)
BUN: 43 mg/dL — ABNORMAL HIGH (ref 6–20)
CALCIUM: 9.6 mg/dL (ref 8.9–10.3)
CO2: 26 mmol/L (ref 22–32)
Chloride: 102 mmol/L (ref 101–111)
Creatinine, Ser: 1.24 mg/dL — ABNORMAL HIGH (ref 0.44–1.00)
GFR calc non Af Amer: 40 mL/min — ABNORMAL LOW (ref >60)
GFR, EST AFRICAN AMERICAN: 47 mL/min — AB (ref >60)
GLUCOSE: 88 mg/dL (ref 65–99)
POTASSIUM: 6.7 mmol/L — AB (ref 3.5–5.1)
Sodium: 134 mmol/L — ABNORMAL LOW (ref 135–145)
TOTAL PROTEIN: 7 g/dL (ref 6.5–8.1)

## 2015-07-13 MED ORDER — SODIUM POLYSTYRENE SULFONATE 15 GM/60ML PO SUSP: 15.0000 g | Freq: Four times a day (QID) | ORAL | Status: DC

## 2015-07-13 NOTE — Progress Notes (Signed)
Placed yellow bracelet on patient and educated her to wear it to all visits even lab appointments only.

## 2015-07-13 NOTE — Telephone Encounter (Signed)
Critical K+ - 6.7  MD notified.

## 2015-07-14 ENCOUNTER — Encounter: Payer: Self-pay | Admitting: Oncology

## 2015-07-14 ENCOUNTER — Other Ambulatory Visit: Payer: Medicare Other

## 2015-07-14 ENCOUNTER — Ambulatory Visit: Payer: Medicare Other | Admitting: Oncology

## 2015-07-14 DIAGNOSIS — J449 Chronic obstructive pulmonary disease, unspecified: Secondary | ICD-10-CM | POA: Insufficient documentation

## 2015-07-14 NOTE — Progress Notes (Signed)
[facility] @ [facility] [phone]  [phone]     Regina Burke OB: 11/13/1932  MR#: [id number]  CSN#:[id number]  Patient Care Team: Vishwanath Hande, MD as PCP - General (Internal Medicine)  CHIEF COMPLAINT:  Chief Complaint  Patient presents with  . OTHER     No history exists.    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  No cough.  Patient was recently admitted in the hospital with coronary artery disease and a stent placement was done.  No chills.  No fever.  Patient is on oxygen.  Does not smoke.  Appetite has been stable.  Patient was recently hospitalized with increasing shortness of breath.  Hypercholesterolemia.  Patient underwent thoracentesis and cytology was negative for any malignant cells She is here for ongoing evaluation and treatment consideration  From the hospital patient went to rehabilitation facility.  Again the potassium was found to be high and patient was given Kayexalate She did not take it at home.  Potassium today 6.7. REVIEW OF SYSTEMS:   Gen. status: Declining performance status due to cardiac condition.  HEENT: No evidence of stomatitis or difficulty in swallowing.  Lungs: Increasing shortness of breath due to coronary condition on oxygen.  Cardiac: Recent coronary artery disease status post stent placement GI: No nausea no vomiting.  GU: No dysuria or hematuria.  Musculoskeletal system no bony pains.  Lower extremity  1+ edema.  Neurological system no headache dizziness.  Skin: No rash.  As per HPI. Otherwise, a complete review of systems is negatve.  PAST MEDICAL HISTORY: Past Medical History  Diagnosis Date  . COPD (chronic obstructive pulmonary disease) (HCC)   . CHF (congestive heart failure) (HCC)   . Hypertension   . Rapid palpitations 2014    Seen at [facility], may have been atrial fibrillation  . DOE (dyspnea on exertion)     Started after treatment for lung cancer  . High cholesterol   . Heart murmur   . Myocardial infarction (HCC) 12/22/2014   . Lung cancer (HCC) dx'd 2014    S/P radiation 2015  . Sleep apnea   . Cushing's disease (HCC)   . Type II diabetes mellitus (HCC)   . Anemia   . GERD (gastroesophageal reflux disease)   . History of hiatal hernia   . Migraine   . Arthritis   . Gout   . Depression   . Chronic kidney disease (CKD), stage III (moderate)     PAST SURGICAL HISTORY: Past Surgical History  Procedure Laterality Date  . Adrenalectomy Left 1980's    "Cushings"  . Abdominal hysterectomy    . Appendectomy    . Cholecystectomy    . Coronary angioplasty with stent placement  12/23/2014  . Tonsillectomy    . Fracture surgery    . Wrist fracture surgery Bilateral ~ 2000  . Breast cyst excision Left   . Tubal ligation    .   Left heart catheterization with coronary angiogram N/A 12/23/2014    Procedure: LEFT HEART CATHETERIZATION WITH CORONARY ANGIOGRAM;  Surgeon: Christopher D McAlhany, MD;  Location: [facility] CATH LAB;  Service: Cardiovascular;  Laterality: N/A;  . Percutaneous coronary stent intervention (pci-s)  12/23/2014    Procedure: PERCUTANEOUS CORONARY STENT INTERVENTION (PCI-S);  Surgeon: Christopher D McAlhany, MD;  Location: [facility] CATH LAB;  Service: Cardiovascular;;  Promus 2.25x8    FAMILY HISTORY There is no significant family history of breast cancer, ovarian cancer, colon cancer GYNECOLOGIC HISTORY:  No LMP recorded. Patient has had a hysterectomy.      ADVANCED DIRECTIVES:  Patient does not have any living will or healthcare power of attorney.  Information was given .  Available resources had been discussed.  We will follow-up on subsequent appointments regarding this issue  HEALTH MAINTENANCE: Social History  Substance Use Topics  . Smoking status: Former Smoker -- 1.00 packs/day for 45 years    Types: Cigarettes    Quit date: 04/25/1998  . Smokeless tobacco: Never Used  . Alcohol Use: Yes     Comment: 12/23/2014 "might have a couple mixed drinks/year"      Allergies  Allergen Reactions  . Ciprofloxacin Shortness Of Breath, Itching and Rash  . Doxycycline Shortness Of Breath, Itching and Rash  . Penicillins Shortness Of Breath, Itching and Rash  . Sulfa Antibiotics Shortness Of Breath, Itching and Rash  . Morphine And Related Itching    Current Outpatient Prescriptions  Medication Sig Dispense Refill  . ALPRAZolam (XANAX) 0.25 MG tablet Take 1 tablet (0.25 mg total) by mouth 3 (three) times daily as needed for anxiety. 30 tablet 0  . aspirin EC 81 MG tablet Take 1 tablet (81 mg total) by mouth daily. 90 tablet 3  . atorvastatin (LIPITOR) 10 MG tablet Take 10 mg by mouth daily.    . cholecalciferol (VITAMIN D) 400 UNITS TABS tablet Take 400 Units by mouth daily.    . clopidogrel (PLAVIX) 75 MG tablet Take 1 tablet (75 mg total) by mouth daily. 30 tablet 11  . Cyanocobalamin (VITAMIN B-12) 500 MCG SUBL Place 500 mcg under the tongue daily.    . diltiazem (DILACOR XR) 240 MG 24 hr capsule Take 1 capsule (240 mg total) by mouth daily. 30 capsule 5  . docusate sodium (COLACE) 100 MG capsule Take 1 capsule (100 mg total) by mouth 2 (two) times daily as needed for mild constipation. 10 capsule 0  . famotidine (PEPCID) 20 MG tablet Take 1 tablet (20 mg total) by mouth daily. 30 tablet 3  . febuxostat (ULORIC) 40 MG tablet Take 40 mg by mouth daily.     . fenofibrate (TRICOR) 48 MG tablet Take 48 mg by mouth daily.    . ferrous  sulfate 325 (65 FE) MG EC tablet Take 325 mg by mouth daily.     . furosemide (LASIX) 20 MG tablet Take 1 tablet (20 mg total) by mouth daily. 30 tablet 5  . metFORMIN (GLUCOPHAGE) 500 MG tablet Take 500 mg by mouth 2 (two) times daily with a meal.    . metoprolol succinate (TOPROL-XL) 100 MG 24 hr tablet Take 1 tablet (100 mg total) by mouth daily. Take with or immediately following a meal. 30 tablet 11  . Multiple Vitamins-Minerals (PRESERVISION AREDS 2) CAPS Take 1 capsule by mouth daily.    . nitroGLYCERIN (NITROSTAT) 0.4 MG SL tablet Place 1 tablet (0.4 mg total) under the tongue every 5 (five) minutes as needed for   chest pain. 25 tablet 3  . sertraline (ZOLOFT) 25 MG tablet Take 25 mg by mouth daily.    . vitamin C (ASCORBIC ACID) 500 MG tablet Take 500 mg by mouth daily.    . sodium polystyrene (KAYEXALATE) 15 GM/60ML suspension Take 60 mLs (15 g total) by mouth 4 (four) times daily. 500 mL 0   No current facility-administered medications for this visit.    OBJECTIVE:  Filed Vitals:   07/13/15 1522  BP: 137/71  Pulse: 76  Temp: 96.3 F (35.7 C)     Body mass index is 26.16 kg/(m^2).    ECOG FS:2 - Symptomatic, <50% confined to bed  PHYSICAL EXAM: Patient is in wheelchair.  Feeling extremely weak and tired. Mucous membranes are dry. Lungs: No crepitation or rhonchi. GI: Abdomen is soft liver and spleen not palpable Lower extremity no edema Skin: No rash Neurological system difficult to evaluate Cranial nerves  are not impaired. Cardiac: Normal heart rhythm with bradycardia    LAB RESULTS:  Appointment on 07/13/2015  Component Date Value Ref Range Status  . WBC 07/13/2015 6.6  3.6 - 11.0 K/uL Final  . RBC 07/13/2015 4.11  3.80 - 5.20 MIL/uL Final  . Hemoglobin 07/13/2015 10.3* 12.0 - 16.0 g/dL Final  . HCT 07/13/2015 31.7* 35.0 - 47.0 % Final  . MCV 07/13/2015 77.2* 80.0 - 100.0 fL Final  . MCH 07/13/2015 25.1* 26.0 - 34.0 pg Final  . MCHC 07/13/2015 32.5  32.0 -  36.0 g/dL Final  . RDW 07/13/2015 16.6* 11.5 - 14.5 % Final  . Platelets 07/13/2015 192  150 - 440 K/uL Final  . Neutrophils Relative % 07/13/2015 74   Final  . Neutro Abs 07/13/2015 4.9  1.4 - 6.5 K/uL Final  . Lymphocytes Relative 07/13/2015 16   Final  . Lymphs Abs 07/13/2015 1.0  1.0 - 3.6 K/uL Final  . Monocytes Relative 07/13/2015 5   Final  . Monocytes Absolute 07/13/2015 0.3  0.2 - 0.9 K/uL Final  . Eosinophils Relative 07/13/2015 4   Final  . Eosinophils Absolute 07/13/2015 0.3  0 - 0.7 K/uL Final  . Basophils Relative 07/13/2015 1   Final  . Basophils Absolute 07/13/2015 0.1  0 - 0.1 K/uL Final  . Sodium 07/13/2015 134* 135 - 145 mmol/L Final  . Potassium 07/13/2015 6.7* 3.5 - 5.1 mmol/L Final   Comment: RESULTS VERIFIED BY REPEAT TESTING CRITICAL RESULT CALLED TO, READ BACK BY AND VERIFIED WITH ANITA BLACK AT 1525 07/13/2015 [facility]   . Chloride 07/13/2015 102  101 - 111 mmol/L Final  . CO2 07/13/2015 26  22 - 32 mmol/L Final  . Glucose, Bld 07/13/2015 88  65 - 99 mg/dL Final  . BUN 07/13/2015 43* 6 - 20 mg/dL Final  . Creatinine, Ser 07/13/2015 1.24* 0.44 - 1.00 mg/dL Final  . Calcium 07/13/2015 9.6  8.9 - 10.3 mg/dL Final  . Total Protein 07/13/2015 7.0  6.5 - 8.1 g/dL Final  . Albumin 07/13/2015 4.3  3.5 - 5.0 g/dL Final  . AST 07/13/2015 22  15 - 41 U/L Final  . ALT 07/13/2015 19  14 - 54 U/L Final  . Alkaline Phosphatase 07/13/2015 44  38 - 126 U/L Final  . Total Bilirubin 07/13/2015 0.6  0.3 - 1.2 mg/dL Final  . GFR calc non Af Amer 07/13/2015 40* >60 mL/min Final  . GFR calc Af Amer 07/13/2015 47* >60 mL/min Final   Comment: (NOTE) The eGFR has been calculated using the CKD   EPI equation. This calculation has not been validated in all clinical situations. eGFR's persistently <60 mL/min signify possible Chronic Kidney Disease.   . Anion gap 07/13/2015 6  5 - 15 Final    No results found for: LABCA2 No results found for: CA199 Lab Results  Component Value Date     CEA 0.6 03/24/2013       ASSESSMENT: Carcinoma of lung, non-small cell type.  No evidence of recurrent or progressive disease based on clinical and radio graphic examination History of coronary artery disease.   MEDICAL DECISION MAKING:   Carcinoma of lung non-small cell type recent hospitalization all the records were reviewed thoracentesis was negative for malignant cells Patient was informed that result General decline is secondary to multiple causes including anemia of unknown etiology.  Congestive heart failure cardiac arrhythmia. Hyperkalemia etiology is not known and was managing the hospital Patient was instructed for next 24 hours to go to emergency room or take A late and get potassium and recheck in primary care's office with left the message with Dr. HANDE:S office Patient was instructed to call his office and get potassium rechecked Patient was also referred to endocrinologist as previously planned to be sure we are not dealing with any endocrinological disorder for hyperkalemia.  Patient had previous history of left adrenal adenoma Return appointment in 6 months or before for follow-up of carcinoma of lung with a repeat CT scan if needed   Cancer of upper lobe of right lung   Staging form: Lung, AJCC 7th Edition     Clinical: T1, N0, M0 - Signed by Janak Choksi, MD on 01/08/2015   Janak Choksi, MD   07/14/2015 8:16 AM   

## 2015-08-05 ENCOUNTER — Ambulatory Visit: Payer: Medicare Other | Admitting: Oncology

## 2015-08-05 ENCOUNTER — Other Ambulatory Visit: Payer: Medicare Other

## 2015-08-06 ENCOUNTER — Telehealth: Payer: Self-pay | Admitting: Cardiovascular Disease

## 2015-08-06 NOTE — Telephone Encounter (Signed)
Did you mean for this to go to [facility] ??  Came to [place].

## 2015-08-06 NOTE — Telephone Encounter (Signed)
Patient says she does not have transportation out of town and would like to come to [facility].  She is due for 4 m fu with Claiborne Billings this month but wants Dr. Rockey Situ and his first available is in February.   Checking to see if this is ok  With Dr. Claiborne Billings to schedule this appt.  Please call patient or fwd back and I will let her know.    Thank you

## 2015-08-06 NOTE — Telephone Encounter (Signed)
Yes .  Sorry will send there.  Thank you!

## 2015-08-06 NOTE — Telephone Encounter (Signed)
Message sent to [facility] for advice.

## 2015-08-10 NOTE — Telephone Encounter (Signed)
Ok to go to [employer]

## 2015-08-10 NOTE — Telephone Encounter (Signed)
Returned call to patient Dr.Kelly advised ok to go to [place] office.She stated she has appointment with Dr.Gollan 08/12/15.

## 2015-08-12 ENCOUNTER — Ambulatory Visit (INDEPENDENT_AMBULATORY_CARE_PROVIDER_SITE_OTHER): Payer: Medicare Other | Admitting: Cardiovascular Disease

## 2015-08-12 ENCOUNTER — Encounter: Payer: Self-pay | Admitting: Cardiovascular Disease

## 2015-08-12 VITALS — BP 120/60 | HR 71 | Ht 65.0 in | Wt 157.2 lb

## 2015-08-12 DIAGNOSIS — I10 Essential (primary) hypertension: Secondary | ICD-10-CM

## 2015-08-12 DIAGNOSIS — J90 Pleural effusion, not elsewhere classified: Secondary | ICD-10-CM

## 2015-08-12 DIAGNOSIS — I480 Paroxysmal atrial fibrillation: Secondary | ICD-10-CM | POA: Diagnosis not present

## 2015-08-12 DIAGNOSIS — I2510 Atherosclerotic heart disease of native coronary artery without angina pectoris: Secondary | ICD-10-CM | POA: Diagnosis not present

## 2015-08-12 DIAGNOSIS — I2583 Coronary atherosclerosis due to lipid rich plaque: Secondary | ICD-10-CM

## 2015-08-12 DIAGNOSIS — I4891 Unspecified atrial fibrillation: Secondary | ICD-10-CM | POA: Diagnosis not present

## 2015-08-12 DIAGNOSIS — D6189 Other specified aplastic anemias and other bone marrow failure syndromes: Secondary | ICD-10-CM

## 2015-08-12 DIAGNOSIS — J432 Centrilobular emphysema: Secondary | ICD-10-CM

## 2015-08-12 DIAGNOSIS — I5033 Acute on chronic diastolic (congestive) heart failure: Secondary | ICD-10-CM

## 2015-08-12 NOTE — Assessment & Plan Note (Signed)
Suggested she stay on Lasix daily,  Will need periodic BMP monitoring Mildly dry but dramatically improved breathing

## 2015-08-12 NOTE — Assessment & Plan Note (Signed)
Pleural effusion has improved following thoracentesis bilaterally No reaccumulation based on clinical exam today

## 2015-08-12 NOTE — Patient Instructions (Signed)
You are doing well. No medication changes were made.  Please call us if you have new issues that need to be addressed before your next appt.  Your physician wants you to follow-up in: 6 months.  You will receive a reminder letter in the mail two months in advance. If you don't receive a letter, please call our office to schedule the follow-up appointment.   

## 2015-08-12 NOTE — Assessment & Plan Note (Signed)
Blood pressure is well controlled on today's visit. No changes made to the medications. 

## 2015-08-12 NOTE — Progress Notes (Signed)
Patient ID: Regina Burke, female    DOB: 08-01-1933, 81 y.o.   MRN: [id number]  HPI Comments: Regina Burke is a 81 y.o. female  hospitalization  April 2016  For chest pain, stent placed to marginal branch of the circumflex,  history of paroxysmal atrial fibrillation, COPD, lung cancer, diabetes mellitus, recent admission to [facility] with acute respiratory distress, pneumonia, bilateral pleural effusions requiring thoracentesis, acute on chronic diastolic heart failure,   hospital admission in August 2016  for electrolyte abnormality, digoxin toxicity, readmitted 06/07/2015 with shortness of breath, found to have atrial fibrillation , potassium of 7, renal failure, anemia who presents to establish care in the [place] office  In follow-up today, she reports that she feels much better Hospital records reviewed from admission October 2016 at [facility] recent hospitalization for respiratory distress, etiology Likely multifactorial including profound anemia, atrial fibrillation, severe COPD, pleural effusions .  In follow-up today, has URI, seems to be improving after 5 days. No significant sputum production. She is concerned as she has not qualified for BiPAP, did the wrong sleep study (CPAP). She denies any problems sleeping, doing well on nasal cannula oxygen Previously seen by Dr. Raul Del, has not had any follow-up  She denies any leg edema, no abdominal bloating, no leg edema She's been taking Lasix daily, creatinine and BUN mildly elevated on recent blood work Potassium was very high, given medication, presumably Kayexalate to decrease her potassium, most recently 5.1 Records were reviewed with her  Occasionally has dizziness at home, wonders if she is getting too dehydrated, takes Lasix 20 mg daily  EKG on today's visit shows normal sinus rhythm with rate 71 bpm, left bundle branch block other past medical history  Other past medical history In the hospital had significant anemia, blood  count down to hemoglobin 6.7  Was in atrial fibrillation but Converted to normal sinus rhythm after thoracentesis (first one) Breathing better after second thoracentesis   She did have Digoxin toxicity, This was not restarted at discharge   In terms of her anemia, she was given 1 unit of blood, hematocrit 21 up to 24 - 25 patient reports she used to have shots, possibly epo  Presented to [facility] on  12/22/2014 with chest pain.   cardiac catheterization  found to have mild 50% disease in the diagonal branch of the LAD, but high-grade 90% hazy stenosis in the marginal branch of the circumflex.  She underwent successful PTCA and insertion of a 2.25x 8 millimeter Promus premier DES stent at the ostium of the OM3 vessell which was postdilated to 2.5 mm.   Allergies  Allergen Reactions  . Ciprofloxacin Shortness Of Breath, Itching and Rash  . Doxycycline Shortness Of Breath, Itching and Rash  . Penicillins Shortness Of Breath, Itching and Rash  . Sulfa Antibiotics Shortness Of Breath, Itching and Rash  . Morphine And Related Itching    Current Outpatient Prescriptions on File Prior to Visit  Medication Sig Dispense Refill  . ALPRAZolam (XANAX) 0.25 MG tablet Take 1 tablet (0.25 mg total) by mouth 3 (three) times daily as needed for anxiety. 30 tablet 0  . aspirin EC 81 MG tablet Take 1 tablet (81 mg total) by mouth daily. 90 tablet 3  . atorvastatin (LIPITOR) 10 MG tablet Take 10 mg by mouth daily.    . cholecalciferol (VITAMIN D) 400 UNITS TABS tablet Take 400 Units by mouth daily.    . clopidogrel (PLAVIX) 75 MG tablet Take 1 tablet (75 mg total) by  mouth daily. 30 tablet 11  . Cyanocobalamin (VITAMIN B-12) 500 MCG SUBL Place 500 mcg under the tongue daily.    Marland Kitchen diltiazem (DILACOR XR) 240 MG 24 hr capsule Take 1 capsule (240 mg total) by mouth daily. 30 capsule 5  . docusate sodium (COLACE) 100 MG capsule Take 1 capsule (100 mg total) by mouth 2 (two) times daily as needed for mild  constipation. 10 capsule 0  . famotidine (PEPCID) 20 MG tablet Take 1 tablet (20 mg total) by mouth daily. 30 tablet 3  . febuxostat (ULORIC) 40 MG tablet Take 40 mg by mouth daily.     . fenofibrate (TRICOR) 48 MG tablet Take 48 mg by mouth daily.    . ferrous sulfate 325 (65 FE) MG EC tablet Take 325 mg by mouth daily.     . furosemide (LASIX) 20 MG tablet Take 1 tablet (20 mg total) by mouth daily. 30 tablet 5  . metFORMIN (GLUCOPHAGE) 500 MG tablet Take 500 mg by mouth 2 (two) times daily with a meal.    . metoprolol succinate (TOPROL-XL) 100 MG 24 hr tablet Take 1 tablet (100 mg total) by mouth daily. Take with or immediately following a meal. 30 tablet 11  . Multiple Vitamins-Minerals (PRESERVISION AREDS 2) CAPS Take 1 capsule by mouth daily.    . nitroGLYCERIN (NITROSTAT) 0.4 MG SL tablet Place 1 tablet (0.4 mg total) under the tongue every 5 (five) minutes as needed for chest pain. 25 tablet 3  . sertraline (ZOLOFT) 25 MG tablet Take 25 mg by mouth daily.    . sodium polystyrene (KAYEXALATE) 15 GM/60ML suspension Take 60 mLs (15 g total) by mouth 4 (four) times daily. 500 mL 0  . vitamin C (ASCORBIC ACID) 500 MG tablet Take 500 mg by mouth daily.     No current facility-administered medications on file prior to visit.    Past Medical History  Diagnosis Date  . COPD (chronic obstructive pulmonary disease) ([place])   . CHF (congestive heart failure) ([place])   . Hypertension   . Rapid palpitations 2014    Seen at [facility], may have been atrial fibrillation  . DOE (dyspnea on exertion)     Started after treatment for lung cancer  . High cholesterol   . Heart murmur   . Myocardial infarction ([place]) 12/22/2014   . Lung cancer ([place]) dx'd 2014    S/P radiation 2015  . Sleep apnea   . Cushing's disease ([place])   . Type II diabetes mellitus ([place])   . Anemia   . GERD (gastroesophageal reflux disease)   . History of hiatal hernia   . Migraine   . Arthritis   . Gout   . Depression   . Chronic  kidney disease (CKD), stage III (moderate)     Past Surgical History  Procedure Laterality Date  . Adrenalectomy Left 1980's    "Cushings"  . Abdominal hysterectomy    . Appendectomy    . Cholecystectomy    . Coronary angioplasty with stent placement  12/23/2014  . Tonsillectomy    . Fracture surgery    . Wrist fracture surgery Bilateral ~ 2000  . Breast cyst excision Left   . Tubal ligation    . Left heart catheterization with coronary angiogram N/A 12/23/2014    Procedure: LEFT HEART CATHETERIZATION WITH CORONARY ANGIOGRAM;  Surgeon: Burnell Blanks, MD;  Location: [facility] CATH LAB;  Service: Cardiovascular;  Laterality: N/A;  . Percutaneous coronary stent intervention (pci-s)  12/23/2014  Procedure: PERCUTANEOUS CORONARY STENT INTERVENTION (PCI-S);  Surgeon: Burnell Blanks, MD;  Location: [facility] CATH LAB;  Service: Cardiovascular;;  Promus 2.25x8    Social History  reports that she quit smoking about 17 years ago. Her smoking use included Cigarettes. She has a 45 pack-year smoking history. She has never used smokeless tobacco. She reports that she does not drink alcohol or use illicit drugs.  Family History family history includes COPD in her brother; Diabetes in her mother; Heart disease in her father and mother; Hypertension in her father and mother; Osteoarthritis in her mother.   Review of Systems  Respiratory: Positive for shortness of breath.   Cardiovascular: Negative.   Gastrointestinal: Negative.   Musculoskeletal: Negative.   Neurological: Positive for weakness.  Hematological: Negative.   Psychiatric/Behavioral: Negative.   All other systems reviewed and are negative.   BP 120/60 mmHg  Pulse 71  Ht '5\' 5"'$  (1.651 m)  Wt 157 lb 4 oz (71.328 kg)  BMI 26.17 kg/m2  Physical Exam  Constitutional: She is oriented to person, place, and time. She appears well-developed and well-nourished.  On nasal cannula oxygen  HENT:  Head: Normocephalic.  Nose: Nose  normal.  Mouth/Throat: Oropharynx is clear and moist.  Eyes: Conjunctivae are normal. Pupils are equal, round, and reactive to light.  Neck: Normal range of motion. Neck supple. No JVD present.  Cardiovascular: Normal rate, regular rhythm, normal heart sounds and intact distal pulses.  Exam reveals no gallop and no friction rub.   No murmur heard. Pulmonary/Chest: Effort normal. No respiratory distress. She has no wheezes. She has rales. She exhibits no tenderness.  Abdominal: Soft. Bowel sounds are normal. She exhibits no distension. There is no tenderness.  Musculoskeletal: Normal range of motion. She exhibits no edema or tenderness.  Lymphadenopathy:    She has no cervical adenopathy.  Neurological: She is alert and oriented to person, place, and time. Coordination normal.  Skin: Skin is warm and dry. No rash noted. No erythema.  Psychiatric: She has a normal mood and affect. Her behavior is normal. Judgment and thought content normal.

## 2015-08-12 NOTE — Assessment & Plan Note (Signed)
Currently with no symptoms of angina. No further workup at this time. Continue current medication regimen. 

## 2015-08-12 NOTE — Assessment & Plan Note (Signed)
Maintaining normal sinus rhythm on today's visit, we'll continue diltiazem, metoprolol. Currently not on anticoagulation. This was not started in the hospital secondary to profound anemia . For now we will suggest she continue on aspirin and Plavix .

## 2015-08-12 NOTE — Assessment & Plan Note (Signed)
Anemia managed by oncology. Shortness of breath symptoms should improve with improved anemia, she does report needing procrit in the past

## 2015-08-12 NOTE — Assessment & Plan Note (Signed)
Seems to be doing well on 2 L nasal cannula oxygen. Currently do not see the need for BiPAP. She is very concerned that she did not do the appropriate testing for BiPAP, only the CPAP testing which she does not want. Tried to reassure her that so far she has done well and may not need the BiPAP

## 2015-10-05 ENCOUNTER — Encounter: Payer: Self-pay | Admitting: Oncology

## 2015-10-05 ENCOUNTER — Inpatient Hospital Stay: Payer: Medicare Other | Attending: Oncology | Admitting: Oncology

## 2015-10-05 ENCOUNTER — Inpatient Hospital Stay: Payer: Medicare Other

## 2015-10-05 VITALS — BP 138/63 | HR 75 | Temp 97.1°F | Resp 18 | Wt 169.0 lb

## 2015-10-05 DIAGNOSIS — M1990 Unspecified osteoarthritis, unspecified site: Secondary | ICD-10-CM | POA: Insufficient documentation

## 2015-10-05 DIAGNOSIS — I252 Old myocardial infarction: Secondary | ICD-10-CM | POA: Diagnosis not present

## 2015-10-05 DIAGNOSIS — K219 Gastro-esophageal reflux disease without esophagitis: Secondary | ICD-10-CM | POA: Insufficient documentation

## 2015-10-05 DIAGNOSIS — M109 Gout, unspecified: Secondary | ICD-10-CM | POA: Insufficient documentation

## 2015-10-05 DIAGNOSIS — D649 Anemia, unspecified: Secondary | ICD-10-CM | POA: Diagnosis not present

## 2015-10-05 DIAGNOSIS — E119 Type 2 diabetes mellitus without complications: Secondary | ICD-10-CM | POA: Diagnosis not present

## 2015-10-05 DIAGNOSIS — I129 Hypertensive chronic kidney disease with stage 1 through stage 4 chronic kidney disease, or unspecified chronic kidney disease: Secondary | ICD-10-CM | POA: Diagnosis not present

## 2015-10-05 DIAGNOSIS — Z87891 Personal history of nicotine dependence: Secondary | ICD-10-CM | POA: Insufficient documentation

## 2015-10-05 DIAGNOSIS — E875 Hyperkalemia: Secondary | ICD-10-CM | POA: Diagnosis not present

## 2015-10-05 DIAGNOSIS — G4730 Sleep apnea, unspecified: Secondary | ICD-10-CM | POA: Insufficient documentation

## 2015-10-05 DIAGNOSIS — C3411 Malignant neoplasm of upper lobe, right bronchus or lung: Secondary | ICD-10-CM | POA: Insufficient documentation

## 2015-10-05 DIAGNOSIS — E7800 Pure hypercholesterolemia, unspecified: Secondary | ICD-10-CM | POA: Insufficient documentation

## 2015-10-05 DIAGNOSIS — I509 Heart failure, unspecified: Secondary | ICD-10-CM | POA: Insufficient documentation

## 2015-10-05 DIAGNOSIS — N183 Chronic kidney disease, stage 3 (moderate): Secondary | ICD-10-CM | POA: Insufficient documentation

## 2015-10-05 DIAGNOSIS — J449 Chronic obstructive pulmonary disease, unspecified: Secondary | ICD-10-CM | POA: Insufficient documentation

## 2015-10-05 DIAGNOSIS — Z79899 Other long term (current) drug therapy: Secondary | ICD-10-CM | POA: Diagnosis not present

## 2015-10-05 DIAGNOSIS — Z7982 Long term (current) use of aspirin: Secondary | ICD-10-CM | POA: Diagnosis not present

## 2015-10-05 DIAGNOSIS — I2510 Atherosclerotic heart disease of native coronary artery without angina pectoris: Secondary | ICD-10-CM | POA: Insufficient documentation

## 2015-10-05 LAB — CBC WITH DIFFERENTIAL/PLATELET
BASOS ABS: 0.1 10*3/uL (ref 0–0.1)
BASOS PCT: 1 %
Eosinophils Absolute: 0.3 10*3/uL (ref 0–0.7)
Eosinophils Relative: 4 %
HCT: 29 % — ABNORMAL LOW (ref 35.0–47.0)
Hemoglobin: 9.6 g/dL — ABNORMAL LOW (ref 12.0–16.0)
LYMPHS ABS: 1 10*3/uL (ref 1.0–3.6)
LYMPHS PCT: 13 %
MCH: 25.9 pg — ABNORMAL LOW (ref 26.0–34.0)
MCHC: 33.2 g/dL (ref 32.0–36.0)
MCV: 78 fL — ABNORMAL LOW (ref 80.0–100.0)
Monocytes Absolute: 0.4 10*3/uL (ref 0.2–0.9)
Monocytes Relative: 5 %
NEUTROS ABS: 5.9 10*3/uL (ref 1.4–6.5)
NEUTROS PCT: 77 %
PLATELETS: 187 10*3/uL (ref 150–440)
RBC: 3.72 MIL/uL — ABNORMAL LOW (ref 3.80–5.20)
RDW: 16.6 % — AB (ref 11.5–14.5)
WBC: 7.6 10*3/uL (ref 3.6–11.0)

## 2015-10-05 LAB — COMPREHENSIVE METABOLIC PANEL
ALBUMIN: 4.4 g/dL (ref 3.5–5.0)
ALT: 29 U/L (ref 14–54)
ANION GAP: 5 (ref 5–15)
AST: 31 U/L (ref 15–41)
Alkaline Phosphatase: 43 U/L (ref 38–126)
BILIRUBIN TOTAL: 0.4 mg/dL (ref 0.3–1.2)
BUN: 42 mg/dL — AB (ref 6–20)
CHLORIDE: 104 mmol/L (ref 101–111)
CO2: 25 mmol/L (ref 22–32)
Calcium: 8.9 mg/dL (ref 8.9–10.3)
Creatinine, Ser: 1.28 mg/dL — ABNORMAL HIGH (ref 0.44–1.00)
GFR calc Af Amer: 45 mL/min — ABNORMAL LOW (ref >60)
GFR, EST NON AFRICAN AMERICAN: 38 mL/min — AB (ref >60)
GLUCOSE: 117 mg/dL — AB (ref 65–99)
POTASSIUM: 5.2 mmol/L — AB (ref 3.5–5.1)
Sodium: 134 mmol/L — ABNORMAL LOW (ref 135–145)
TOTAL PROTEIN: 6.8 g/dL (ref 6.5–8.1)

## 2015-10-05 NOTE — Progress Notes (Signed)
[place] @ [facility] Telephone:([phone]) [phone]  Fax:([phone]) [place]: November 24, 1932  MR#: [id number]  VFI#:[id number]  Patient Care Team: Tracie Harrier, MD as PCP - General (Internal Medicine)  CHIEF COMPLAINT:  Chief Complaint  Patient presents with  . Lung Cancer     No history exists.    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  No cough.  Patient was recently admitted in the hospital with coronary artery disease and a stent placement was done.  No chills.  No fever.  Patient is on oxygen.  Does not smoke.  Appetite has been stable.  Patient was recently hospitalized with increasing shortness of breath.  Hypercholesterolemia.  Patient underwent thoracentesis and cytology was negative for any malignant cells She is here for ongoing evaluation and treatment consideration Patient came today for further follow-up regarding carcinoma of lung. Patient has less shortness of breath. No chest pain.  Appetite has been stable. Patient did not get any mammogram done Being followed by nephrologist for renal insufficiency and mild hyperkalemia . REVIEW OF SYSTEMS:   Gen. status: Declining performance status due to cardiac condition.  HEENT: No evidence of stomatitis or difficulty in swallowing.  Lungs: Increasing shortness of breath due to coronary condition on oxygen.  Cardiac: Recent coronary artery disease status post stent placement GI: No nausea no  vomiting.  GU: No dysuria or hematuria.  Musculoskeletal system no bony pains.  Lower extremity 1+ edema.  Neurological system no headache dizziness.  Skin: No rash.    As per HPI. Otherwise, a complete review of systems is negatve.  PAST MEDICAL HISTORY: Past Medical History  Diagnosis Date  . COPD (chronic obstructive pulmonary disease) ([place])   . CHF (congestive heart failure) ([place])   . Hypertension   . Rapid palpitations 2014    Seen at [facility], may have been atrial fibrillation  . DOE (dyspnea on exertion)     Started after treatment for lung cancer  . High cholesterol   . Heart murmur   . Myocardial infarction ([place]) 12/22/2014   . Lung cancer ([place]) dx'd 2014    S/P radiation 2015  . Sleep apnea   . Cushing's disease ([place])   . Type II diabetes mellitus ([place])   . Anemia   . GERD (gastroesophageal reflux disease)   . History of hiatal hernia   . Migraine   . Arthritis   . Gout   . Depression   . Chronic kidney disease (CKD), stage III (moderate)     PAST SURGICAL HISTORY: Past Surgical History  Procedure Laterality Date  . Adrenalectomy Left 1980's    "Cushings"  . Abdominal hysterectomy    . Appendectomy    . Cholecystectomy    . Coronary angioplasty with stent placement  12/23/2014  . Tonsillectomy    . Fracture surgery    . Wrist fracture surgery Bilateral ~ 2000  .  Breast cyst excision Left   . Tubal ligation    . Left heart catheterization with coronary angiogram N/A 12/23/2014    Procedure: LEFT HEART CATHETERIZATION WITH CORONARY ANGIOGRAM;  Surgeon: Burnell Blanks, MD;  Location: [facility] CATH LAB;  Service: Cardiovascular;  Laterality: N/A;  . Percutaneous coronary stent intervention (pci-s)  12/23/2014    Procedure: PERCUTANEOUS CORONARY STENT INTERVENTION (PCI-S);  Surgeon: Burnell Blanks, MD;  Location: [facility] CATH LAB;  Service: Cardiovascular;;  Promus 2.25x8    FAMILY HISTORY There is no significant family history of breast cancer, ovarian  cancer, colon cancer GYNECOLOGIC HISTORY:  No LMP recorded. Patient has had a hysterectomy.     ADVANCED DIRECTIVES:  Patient does not have any living will or healthcare power of attorney.  Information was given .  Available resources had been discussed.  We will follow-up on subsequent appointments regarding this issue  HEALTH MAINTENANCE: Social History  Substance Use Topics  . Smoking status: Former Smoker -- 1.00 packs/day for 45 years    Types: Cigarettes    Quit date: 04/25/1998  . Smokeless tobacco: Never Used  . Alcohol Use: No     Comment: 12/23/2014 "might have a couple mixed drinks/year"      Allergies  Allergen Reactions  . Ciprofloxacin Shortness Of Breath, Itching and Rash  . Doxycycline Shortness Of Breath, Itching and Rash  . Penicillins Shortness Of Breath, Itching and Rash  . Sulfa Antibiotics Shortness Of Breath, Itching and Rash  . Morphine And Related Itching    Current Outpatient Prescriptions  Medication Sig Dispense Refill  . ALPRAZolam (XANAX) 0.25 MG tablet Take 1 tablet (0.25 mg total) by mouth 3 (three) times daily as needed for anxiety. 30 tablet 0  . aspirin EC 81 MG tablet Take 1 tablet (81 mg total) by mouth daily. 90 tablet 3  . atorvastatin (LIPITOR) 10 MG tablet Take 10 mg by mouth daily.    . cholecalciferol (VITAMIN D) 400 UNITS TABS tablet Take 400 Units by mouth daily.    . clopidogrel (PLAVIX) 75 MG tablet Take 1 tablet (75 mg total) by mouth daily. 30 tablet 11  . Cyanocobalamin (VITAMIN B-12) 500 MCG SUBL Place 500 mcg under the tongue daily.    Marland Kitchen diltiazem (DILACOR XR) 240 MG 24 hr capsule Take 1 capsule (240 mg total) by mouth daily. 30 capsule 5  . docusate sodium (COLACE) 100 MG capsule Take 1 capsule (100 mg total) by mouth 2 (two) times daily as needed for mild constipation. 10 capsule 0  . famotidine (PEPCID) 20 MG tablet Take 1 tablet (20 mg total) by mouth daily. 30 tablet 3  . febuxostat (ULORIC) 40 MG tablet Take 40 mg by  mouth daily.     . fenofibrate (TRICOR) 48 MG tablet Take 48 mg by mouth daily.    . ferrous sulfate 325 (65 FE) MG EC tablet Take 325 mg by mouth daily.     . furosemide (LASIX) 20 MG tablet Take 1 tablet (20 mg total) by mouth daily. 30 tablet 5  . metoprolol succinate (TOPROL-XL) 100 MG 24 hr tablet Take 1 tablet (100 mg total) by mouth daily. Take with or immediately following a meal. 30 tablet 11  . Multiple Vitamins-Minerals (PRESERVISION AREDS 2) CAPS Take 1 capsule by mouth daily.    . nitroGLYCERIN (NITROSTAT) 0.4 MG SL tablet Place 1 tablet (0.4 mg total) under the tongue every 5 (five) minutes as needed for chest pain. 25 tablet 3  . sertraline (  ZOLOFT) 25 MG tablet Take 25 mg by mouth daily.    . sodium polystyrene (KAYEXALATE) 15 GM/60ML suspension Take 60 mLs (15 g total) by mouth 4 (four) times daily. 500 mL 0  . vitamin C (ASCORBIC ACID) 500 MG tablet Take 500 mg by mouth daily.     No current facility-administered medications for this visit.    OBJECTIVE:  Filed Vitals:   10/05/15 1520  BP: 138/63  Pulse: 75  Temp: 97.1 F (36.2 C)  Resp: 18     Body mass index is 28.12 kg/(m^2).    ECOG FS:2 - Symptomatic, <50% confined to bed  PHYSICAL EXAM: Gen. status: Patient is alert oriented not any acute distress Mucous membranes are dry. Lungs: No crepitation or rhonchi.  Diminished air entry on both sides GI: Abdomen is soft liver and spleen not palpable Lower extremity no edema Skin: No rash Neurological system difficult to evaluate Cranial nerves  are not impaired. Cardiac: Normal heart rhythm with bradycardia    LAB RESULTS:  Appointment on 10/05/2015  Component Date Value Ref Range Status  . WBC 10/05/2015 7.6  3.6 - 11.0 K/uL Final  . RBC 10/05/2015 3.72* 3.80 - 5.20 MIL/uL Final  . Hemoglobin 10/05/2015 9.6* 12.0 - 16.0 g/dL Final  . HCT 10/05/2015 29.0* 35.0 - 47.0 % Final  . MCV 10/05/2015 78.0* 80.0 - 100.0 fL Final  . MCH 10/05/2015 25.9* 26.0 - 34.0  pg Final  . MCHC 10/05/2015 33.2  32.0 - 36.0 g/dL Final  . RDW 10/05/2015 16.6* 11.5 - 14.5 % Final  . Platelets 10/05/2015 187  150 - 440 K/uL Final  . Neutrophils Relative % 10/05/2015 77   Final  . Neutro Abs 10/05/2015 5.9  1.4 - 6.5 K/uL Final  . Lymphocytes Relative 10/05/2015 13   Final  . Lymphs Abs 10/05/2015 1.0  1.0 - 3.6 K/uL Final  . Monocytes Relative 10/05/2015 5   Final  . Monocytes Absolute 10/05/2015 0.4  0.2 - 0.9 K/uL Final  . Eosinophils Relative 10/05/2015 4   Final  . Eosinophils Absolute 10/05/2015 0.3  0 - 0.7 K/uL Final  . Basophils Relative 10/05/2015 1   Final  . Basophils Absolute 10/05/2015 0.1  0 - 0.1 K/uL Final  . Sodium 10/05/2015 134* 135 - 145 mmol/L Final  . Potassium 10/05/2015 5.2* 3.5 - 5.1 mmol/L Final  . Chloride 10/05/2015 104  101 - 111 mmol/L Final  . CO2 10/05/2015 25  22 - 32 mmol/L Final  . Glucose, Bld 10/05/2015 117* 65 - 99 mg/dL Final  . BUN 10/05/2015 42* 6 - 20 mg/dL Final  . Creatinine, Ser 10/05/2015 1.28* 0.44 - 1.00 mg/dL Final  . Calcium 10/05/2015 8.9  8.9 - 10.3 mg/dL Final  . Total Protein 10/05/2015 6.8  6.5 - 8.1 g/dL Final  . Albumin 10/05/2015 4.4  3.5 - 5.0 g/dL Final  . AST 10/05/2015 31  15 - 41 U/L Final  . ALT 10/05/2015 29  14 - 54 U/L Final  . Alkaline Phosphatase 10/05/2015 43  38 - 126 U/L Final  . Total Bilirubin 10/05/2015 0.4  0.3 - 1.2 mg/dL Final  . GFR calc non Af Amer 10/05/2015 38* >60 mL/min Final  . GFR calc Af Amer 10/05/2015 45* >60 mL/min Final   Comment: (NOTE) The eGFR has been calculated using the CKD EPI equation. This calculation has not been validated in all clinical situations. eGFR's persistently <60 mL/min signify possible Chronic Kidney Disease.   Georgiann Hahn  gap 10/05/2015 5  5 - 15 Final    No results found for: LABCA2 No results found for: CA199 Lab Results  Component Value Date   CEA 0.6 03/24/2013       ASSESSMENT: Carcinoma of lung, non-small cell type.  No evidence  of recurrent or progressive disease based on clinical and radio graphic examination History of coronary artery disease.   MEDICAL DECISION MAKING:   Carcinoma of lung non-small cell type recent hospitalization all the records were reviewed thoracentesis was negative for malignant cells Patient was informed that result General decline is secondary to multiple causes including anemia of unknown etiology.  Congestive heart failure cardiac arrhythmia. Hyperkalemia etiology is not known and was managing the hospital Being followed by nephrologist We will repeat chest x-ray.  Patient is getting ultrasound tomorrow of kidneys chest x-ray will be done to reassess carcinoma of lung congestive heart failure and pleural effusion  All lab data has been reviewed Cancer of upper lobe of right lung   Staging form: Lung, AJCC 7th Edition     Clinical: T1, N0, M0 - Signed by Forest Gleason, MD on 01/08/2015   Forest Gleason, MD   10/05/2015 3:27 PM

## 2015-10-06 ENCOUNTER — Ambulatory Visit
Admission: RE | Admit: 2015-10-06 | Discharge: 2015-10-06 | Disposition: A | Discharge status: Home / Self Care | Payer: Medicare Other | Source: Ambulatory Visit | Attending: Oncology | Admitting: Oncology

## 2015-10-06 DIAGNOSIS — C3411 Malignant neoplasm of upper lobe, right bronchus or lung: Secondary | ICD-10-CM | POA: Insufficient documentation

## 2015-10-06 DIAGNOSIS — I509 Heart failure, unspecified: Secondary | ICD-10-CM | POA: Diagnosis not present

## 2015-10-06 DIAGNOSIS — I2510 Atherosclerotic heart disease of native coronary artery without angina pectoris: Secondary | ICD-10-CM | POA: Diagnosis not present

## 2015-10-06 DIAGNOSIS — J45909 Unspecified asthma, uncomplicated: Secondary | ICD-10-CM | POA: Diagnosis not present

## 2015-10-06 DIAGNOSIS — J90 Pleural effusion, not elsewhere classified: Secondary | ICD-10-CM | POA: Diagnosis not present

## 2015-11-22 ENCOUNTER — Encounter: Payer: Self-pay | Admitting: *Deleted

## 2015-11-24 ENCOUNTER — Inpatient Hospital Stay
Admission: EM | Admit: 2015-11-24 | Discharge: 2015-11-26 | DRG: 308 | Disposition: A | Discharge status: Home / Self Care | Payer: Medicare Other | Attending: Internal Medicine | Admitting: Internal Medicine

## 2015-11-24 ENCOUNTER — Encounter: Payer: Self-pay | Admitting: Emergency Medicine

## 2015-11-24 ENCOUNTER — Emergency Department: Payer: Medicare Other

## 2015-11-24 DIAGNOSIS — N179 Acute kidney failure, unspecified: Secondary | ICD-10-CM | POA: Diagnosis present

## 2015-11-24 DIAGNOSIS — Z885 Allergy status to narcotic agent status: Secondary | ICD-10-CM

## 2015-11-24 DIAGNOSIS — E1122 Type 2 diabetes mellitus with diabetic chronic kidney disease: Secondary | ICD-10-CM | POA: Diagnosis present

## 2015-11-24 DIAGNOSIS — H9190 Unspecified hearing loss, unspecified ear: Secondary | ICD-10-CM | POA: Diagnosis present

## 2015-11-24 DIAGNOSIS — G43909 Migraine, unspecified, not intractable, without status migrainosus: Secondary | ICD-10-CM | POA: Diagnosis present

## 2015-11-24 DIAGNOSIS — M109 Gout, unspecified: Secondary | ICD-10-CM | POA: Diagnosis present

## 2015-11-24 DIAGNOSIS — I480 Paroxysmal atrial fibrillation: Principal | ICD-10-CM | POA: Diagnosis present

## 2015-11-24 DIAGNOSIS — Z9981 Dependence on supplemental oxygen: Secondary | ICD-10-CM

## 2015-11-24 DIAGNOSIS — I4891 Unspecified atrial fibrillation: Secondary | ICD-10-CM

## 2015-11-24 DIAGNOSIS — Z833 Family history of diabetes mellitus: Secondary | ICD-10-CM

## 2015-11-24 DIAGNOSIS — E860 Dehydration: Secondary | ICD-10-CM | POA: Diagnosis present

## 2015-11-24 DIAGNOSIS — I130 Hypertensive heart and chronic kidney disease with heart failure and stage 1 through stage 4 chronic kidney disease, or unspecified chronic kidney disease: Secondary | ICD-10-CM | POA: Diagnosis present

## 2015-11-24 DIAGNOSIS — I209 Angina pectoris, unspecified: Secondary | ICD-10-CM

## 2015-11-24 DIAGNOSIS — R61 Generalized hyperhidrosis: Secondary | ICD-10-CM | POA: Insufficient documentation

## 2015-11-24 DIAGNOSIS — Z882 Allergy status to sulfonamides status: Secondary | ICD-10-CM

## 2015-11-24 DIAGNOSIS — Z881 Allergy status to other antibiotic agents status: Secondary | ICD-10-CM

## 2015-11-24 DIAGNOSIS — Z87891 Personal history of nicotine dependence: Secondary | ICD-10-CM

## 2015-11-24 DIAGNOSIS — R0600 Dyspnea, unspecified: Secondary | ICD-10-CM | POA: Diagnosis present

## 2015-11-24 DIAGNOSIS — I5033 Acute on chronic diastolic (congestive) heart failure: Secondary | ICD-10-CM | POA: Diagnosis present

## 2015-11-24 DIAGNOSIS — J449 Chronic obstructive pulmonary disease, unspecified: Secondary | ICD-10-CM | POA: Diagnosis present

## 2015-11-24 DIAGNOSIS — D638 Anemia in other chronic diseases classified elsewhere: Secondary | ICD-10-CM | POA: Diagnosis present

## 2015-11-24 DIAGNOSIS — Z79899 Other long term (current) drug therapy: Secondary | ICD-10-CM

## 2015-11-24 DIAGNOSIS — Z8249 Family history of ischemic heart disease and other diseases of the circulatory system: Secondary | ICD-10-CM

## 2015-11-24 DIAGNOSIS — D649 Anemia, unspecified: Secondary | ICD-10-CM | POA: Diagnosis present

## 2015-11-24 DIAGNOSIS — R0602 Shortness of breath: Secondary | ICD-10-CM | POA: Diagnosis not present

## 2015-11-24 DIAGNOSIS — E785 Hyperlipidemia, unspecified: Secondary | ICD-10-CM | POA: Diagnosis present

## 2015-11-24 DIAGNOSIS — Z7982 Long term (current) use of aspirin: Secondary | ICD-10-CM

## 2015-11-24 DIAGNOSIS — F419 Anxiety disorder, unspecified: Secondary | ICD-10-CM | POA: Diagnosis present

## 2015-11-24 DIAGNOSIS — Z880 Allergy status to penicillin: Secondary | ICD-10-CM

## 2015-11-24 DIAGNOSIS — E7800 Pure hypercholesterolemia, unspecified: Secondary | ICD-10-CM | POA: Diagnosis present

## 2015-11-24 DIAGNOSIS — Z888 Allergy status to other drugs, medicaments and biological substances status: Secondary | ICD-10-CM

## 2015-11-24 DIAGNOSIS — K219 Gastro-esophageal reflux disease without esophagitis: Secondary | ICD-10-CM | POA: Diagnosis present

## 2015-11-24 DIAGNOSIS — I252 Old myocardial infarction: Secondary | ICD-10-CM

## 2015-11-24 DIAGNOSIS — Z825 Family history of asthma and other chronic lower respiratory diseases: Secondary | ICD-10-CM

## 2015-11-24 DIAGNOSIS — N289 Disorder of kidney and ureter, unspecified: Secondary | ICD-10-CM | POA: Insufficient documentation

## 2015-11-24 DIAGNOSIS — G4730 Sleep apnea, unspecified: Secondary | ICD-10-CM | POA: Diagnosis present

## 2015-11-24 DIAGNOSIS — N183 Chronic kidney disease, stage 3 (moderate): Secondary | ICD-10-CM | POA: Diagnosis present

## 2015-11-24 DIAGNOSIS — J432 Centrilobular emphysema: Secondary | ICD-10-CM | POA: Insufficient documentation

## 2015-11-24 DIAGNOSIS — M1990 Unspecified osteoarthritis, unspecified site: Secondary | ICD-10-CM | POA: Diagnosis present

## 2015-11-24 DIAGNOSIS — Z955 Presence of coronary angioplasty implant and graft: Secondary | ICD-10-CM

## 2015-11-24 DIAGNOSIS — E249 Cushing's syndrome, unspecified: Secondary | ICD-10-CM | POA: Diagnosis present

## 2015-11-24 DIAGNOSIS — J9620 Acute and chronic respiratory failure, unspecified whether with hypoxia or hypercapnia: Secondary | ICD-10-CM | POA: Diagnosis present

## 2015-11-24 DIAGNOSIS — I447 Left bundle-branch block, unspecified: Secondary | ICD-10-CM | POA: Diagnosis present

## 2015-11-24 DIAGNOSIS — J45909 Unspecified asthma, uncomplicated: Secondary | ICD-10-CM | POA: Diagnosis present

## 2015-11-24 DIAGNOSIS — Z7902 Long term (current) use of antithrombotics/antiplatelets: Secondary | ICD-10-CM

## 2015-11-24 DIAGNOSIS — I25110 Atherosclerotic heart disease of native coronary artery with unstable angina pectoris: Secondary | ICD-10-CM | POA: Diagnosis present

## 2015-11-24 DIAGNOSIS — Z85118 Personal history of other malignant neoplasm of bronchus and lung: Secondary | ICD-10-CM

## 2015-11-24 DIAGNOSIS — F329 Major depressive disorder, single episode, unspecified: Secondary | ICD-10-CM | POA: Diagnosis present

## 2015-11-24 DIAGNOSIS — M810 Age-related osteoporosis without current pathological fracture: Secondary | ICD-10-CM | POA: Diagnosis present

## 2015-11-24 LAB — CBC
HCT: 28.7 % — ABNORMAL LOW (ref 35.0–47.0)
HEMOGLOBIN: 9.7 g/dL — AB (ref 12.0–16.0)
MCH: 26.4 pg (ref 26.0–34.0)
MCHC: 33.7 g/dL (ref 32.0–36.0)
MCV: 78.5 fL — ABNORMAL LOW (ref 80.0–100.0)
PLATELETS: 158 10*3/uL (ref 150–440)
RBC: 3.65 MIL/uL — AB (ref 3.80–5.20)
RDW: 15.1 % — ABNORMAL HIGH (ref 11.5–14.5)
WBC: 7.5 10*3/uL (ref 3.6–11.0)

## 2015-11-24 LAB — BASIC METABOLIC PANEL
Anion gap: 9 (ref 5–15)
BUN: 49 mg/dL — ABNORMAL HIGH (ref 6–20)
CHLORIDE: 102 mmol/L (ref 101–111)
CO2: 26 mmol/L (ref 22–32)
CREATININE: 1.45 mg/dL — AB (ref 0.44–1.00)
Calcium: 8.5 mg/dL — ABNORMAL LOW (ref 8.9–10.3)
GFR, EST AFRICAN AMERICAN: 38 mL/min — AB (ref >60)
GFR, EST NON AFRICAN AMERICAN: 33 mL/min — AB (ref >60)
Glucose, Bld: 125 mg/dL — ABNORMAL HIGH (ref 65–99)
POTASSIUM: 4.3 mmol/L (ref 3.5–5.1)
SODIUM: 137 mmol/L (ref 135–145)

## 2015-11-24 LAB — BRAIN NATRIURETIC PEPTIDE: B NATRIURETIC PEPTIDE 5: 300 pg/mL — AB (ref 0.0–100.0)

## 2015-11-24 LAB — TROPONIN I

## 2015-11-24 MED ORDER — DILTIAZEM HCL 25 MG/5ML IV SOLN
10.0000 mg | Freq: Once | INTRAVENOUS | Status: AC
  Administered 2015-11-24: 10 mg via INTRAVENOUS

## 2015-11-24 MED ORDER — DILTIAZEM HCL 25 MG/5ML IV SOLN
INTRAVENOUS | Status: AC
  Administered 2015-11-24: 10 mg via INTRAVENOUS
  Filled 2015-11-24: qty 5

## 2015-11-24 MED ORDER — DILTIAZEM HCL 100 MG IV SOLR
5.0000 mg/h | Freq: Once | INTRAVENOUS | Status: AC
  Administered 2015-11-24: 5 mg/h via INTRAVENOUS
  Filled 2015-11-24 (×2): qty 100

## 2015-11-24 MED ORDER — FUROSEMIDE 10 MG/ML IJ SOLN
40.0000 mg | Freq: Once | INTRAMUSCULAR | Status: AC
  Administered 2015-11-24: 40 mg via INTRAVENOUS
  Filled 2015-11-24: qty 4

## 2015-11-24 MED ORDER — SODIUM CHLORIDE 0.9 % IV BOLUS (SEPSIS)
1000.0000 mL | Freq: Once | INTRAVENOUS | Status: AC
  Administered 2015-11-24: 1000 mL via INTRAVENOUS

## 2015-11-24 NOTE — ED Notes (Signed)
MD at bedside. 

## 2015-11-24 NOTE — ED Provider Notes (Signed)
[facility] Emergency Department Provider Note  ____________________________________________  Time seen: Approximately 10:04 PM  I have reviewed the triage vital signs and the nursing notes.   HISTORY  Chief Complaint Chest Pain    HPI Regina Burke is a 82 y.o. female with a history of A. fib, CAD status post MI and stent placement, CHF, COPD, HTN, HL, DM presenting for tachycardia, chest pain and shortness of breath. Patient reports that she's been in her usual state of health until this evening when she decided to do some spring cleaning. She was lifting heavy bags and developed a left-sided chest pressure that was associated with shortness of breath and diaphoresis. She did not have any syncope. On arrival, EMS found the patient to have a heart rate in the 180s and she was given 6 mg then 12 mg of adenosine without conversion. The patient maintain her blood pressure throughout transport. On arrival to the emergency department the patient was tachycardic to the 150s and 160s with an EKG consistent with A. fib with RVR. She had a blood pressure of 121/75.   Past Medical History  Diagnosis Date  . COPD (chronic obstructive pulmonary disease) ([place])   . CHF (congestive heart failure) ([place])   . Hypertension   . Rapid palpitations 2014    Seen at [facility], may have been atrial fibrillation  . DOE (dyspnea on exertion)     Started after treatment for lung cancer  . High cholesterol   . Heart murmur   . Myocardial infarction ([place]) 12/22/2014   . Lung cancer ([place]) dx'd 2014    S/P radiation 2015  . Sleep apnea   . Cushing's disease ([place])   . Type II diabetes mellitus ([place])   . Anemia   . GERD (gastroesophageal reflux disease)   . History of hiatal hernia   . Migraine   . Arthritis   . Gout   . Depression   . Asthma   . Dysrhythmia   . Coronary artery disease   . HOH (hard of hearing)   . Chronic kidney disease (CKD), stage III (moderate)     INCREASED KT   . Anginal pain ([place])   . Cough     CHRONIC AT NIGHT  . Edema     FEET/LEGS  . Wheezing     Patient Active Problem List   Diagnosis Date Noted  . Carcinoma of lung ([place]) 10/05/2015  . Chronic obstructive pulmonary disease ([place]) 07/14/2015  . Status post thoracentesis   . Acute on chronic diastolic CHF (congestive heart failure), NYHA class 1 ([place])   . Renal failure   . S/P thoracentesis   . Atrial fibrillation with rapid ventricular response ([place])   . H/O: lung cancer   . Pleural effusion   . Respiratory failure ([place])   . Coronary artery disease involving native coronary artery of native heart without angina pectoris   . S/P coronary artery stent placement   . Pressure ulcer 06/08/2015  . Atrial fibrillation, rapid ([place]) 06/07/2015  . Acute kidney injury ([place]) 06/07/2015  . Hyperkalemia 06/07/2015  . Chest wall pain 06/04/2015  . Acute respiratory failure ([place]) 05/16/2015  . Dehydration 05/09/2015  . Diabetes ([place]) 05/09/2015  . Closed fracture nasal bone 05/09/2015  . Paroxysmal atrial fibrillation ([place]) 05/05/2015  . Preoperative clearance 04/15/2015  . CAD (coronary artery disease) 04/14/2015  . Cancer of upper lobe of right lung ([place]) 01/08/2015  . Allergic state 01/08/2015  . Absolute anemia 01/08/2015  .  A-fib ([place]) 01/08/2015  . Chronic kidney disease 01/08/2015  . CAFL (chronic airflow limitation) ([place]) 01/08/2015  . Arthritis, degenerative 01/08/2015  . 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 Hypertension   . Rapid palpitations   . Diabetes mellitus, type 2 ([place]) 09/11/2014  . Type 2 diabetes mellitus ([place]) 09/11/2014    Past Surgical History  Procedure Laterality Date  . Adrenalectomy Left 1980's    "[place]ushings"  . Abdominal hysterectomy    . Appendectomy    . Cholecystectomy    . Coronary angioplasty with stent placement  12/23/2014  . Tonsillectomy    . Fracture surgery    . Wrist fracture surgery Bilateral ~ 2000  . Breast cyst excision Left    . Tubal ligation    . Left heart catheterization with coronary angiogram N/A 12/23/2014    Procedure: LEFT HEART CATHETERIZATION WITH CORONARY ANGIOGRAM;  Surgeon: Burnell Blanks, MD;  Location: [facility] CATH LAB;  Service: Cardiovascular;  Laterality: N/A;  . Percutaneous coronary stent intervention (pci-s)  12/23/2014    Procedure: PERCUTANEOUS CORONARY STENT INTERVENTION (PCI-S);  Surgeon: Burnell Blanks, MD;  Location: [facility] CATH LAB;  Service: Cardiovascular;;  Promus 2.25x8    Current Outpatient Rx  Name  Route  Sig  Dispense  Refill  . ALPRAZolam (XANAX) 0.25 MG tablet   Oral   Take 1 tablet (0.25 mg total) by mouth 3 (three) times daily as needed for anxiety. Patient taking differently: Take 0.25 mg by mouth 3 (three) times daily as needed for anxiety. TAKES AM   30 tablet   0   . aspirin EC 81 MG tablet   Oral   Take 1 tablet (81 mg total) by mouth daily.   90 tablet   3   . atorvastatin (LIPITOR) 10 MG tablet   Oral   Take 10 mg by mouth daily.         . cholecalciferol (VITAMIN D) 400 UNITS TABS tablet   Oral   Take 800 Units by mouth daily.          . clopidogrel (PLAVIX) 75 MG tablet   Oral   Take 1 tablet (75 mg total) by mouth daily.   30 tablet   11   . Cyanocobalamin (VITAMIN B-12) 500 MCG SUBL   Sublingual   Place 500 mcg under the tongue daily.         Marland Kitchen diltiazem (DILACOR XR) 240 MG 24 hr capsule   Oral   Take 1 capsule (240 mg total) by mouth daily.   30 capsule   5   . docusate sodium (COLACE) 100 MG capsule   Oral   Take 1 capsule (100 mg total) by mouth 2 (two) times daily as needed for mild constipation.   10 capsule   0   . febuxostat (ULORIC) 40 MG tablet   Oral   Take 40 mg by mouth daily.          . fenofibrate (TRICOR) 48 MG tablet   Oral   Take 48 mg by mouth daily.         . ferrous sulfate 325 (65 FE) MG EC tablet   Oral   Take 325 mg by mouth daily.          . furosemide (LASIX) 20 MG tablet   Oral    Take 1 tablet (20 mg total) by mouth daily.   30 tablet   5   . metoprolol succinate (TOPROL-XL) 100 MG 24 hr tablet  Oral   Take 1 tablet (100 mg total) by mouth daily. Take with or immediately following a meal.   30 tablet   11   . Multiple Vitamins-Minerals (PRESERVISION AREDS 2) CAPS   Oral   Take 1 capsule by mouth daily.         . nitroGLYCERIN (NITROSTAT) 0.4 MG SL tablet   Sublingual   Place 1 tablet (0.4 mg total) under the tongue every 5 (five) minutes as needed for chest pain.   25 tablet   3   . sertraline (ZOLOFT) 25 MG tablet   Oral   Take 50 mg by mouth daily.            Allergies Ciprofloxacin; Doxycycline; Penicillins; Sulfa antibiotics; and Morphine and related  Family History  Problem Relation Age of Onset  . Heart disease Mother   . Diabetes Mother   . Osteoarthritis Mother   . Hypertension Mother   . Heart disease Father   . Hypertension Father   . COPD Brother     Social History Social History  Substance Use Topics  . Smoking status: Former Smoker -- 1.00 packs/day for 45 years    Types: Cigarettes    Quit date: 04/25/1998  . Smokeless tobacco: Never Used  . Alcohol Use: No     Comment: 12/23/2014 "might have a couple mixed drinks/year"    Review of Systems Constitutional: No fever/chills.No lightheadedness or syncope. Positive diaphoresis. Eyes: No visual changes. ENT: No sore throat. No congestion or rhinorrhea. Cardiovascular: Positive chest pain. Positive palpitations. Respiratory: Positive shortness of breath.  No cough. Gastrointestinal: No abdominal pain.  No nausea, no vomiting.  No diarrhea.  No constipation. Genitourinary: Negative for dysuria. Musculoskeletal: Negative for back pain. no lower extremity swelling.  Skin: Negative for rash. Neurological: Negative for headaches. No focal numbness, tingling or weakness.   10-point ROS otherwise negative.  ____________________________________________   PHYSICAL  EXAM:  VITAL SIGNS: ED Triage Vitals  Enc Vitals Group     BP 11/24/15 2153 148/124 mmHg     Pulse Rate 11/24/15 2153 156     Resp 11/24/15 2153 22     Temp 11/24/15 2153 97.8 F (36.6 C)     Temp Source 11/24/15 2153 Oral     SpO2 11/24/15 2153 100 %     Weight 11/24/15 2153 190 lb (86.183 kg)     Height 11/24/15 2153 '5\' 4"'$  (1.626 m)     Head Cir --      Peak Flow --      Pain Score 11/24/15 2201 5     Pain Loc --      Pain Edu? --      Excl. in [place]? --     [place] is alert and oriented and answering questions appropriate. She is mildly uncomfortable appearing but nontoxic. yes: Conjunctivae are normal.  EOMI. No scleral icterus. Head: Atraumatic. Nose: No congestion/rhinnorhea. Mouth/Throat: Mucous membranes are moist.  Neck: No stridor.  Supple.  No JVD.  Cardiovascular: Fastatirregularthm. No murmurs, rubs or gallops.  Respiratory: Normal respiratory effort.  No accessory muscle use or retractions. Lungs CTAB.  No wheezes, rales or ronchi. Gastrointestinal: Soft, nontender and nondistended.  No guarding or rebound.  No peritoneal signs. Musculoskeletal: No LE edema. No ttp in the calves or palpable cords.  Negative Homan's sign. Neurologic:  A&Ox3.  Speech is clear.  Face and smile are symmetric.  EOMI.  Moves all extremities well. Skin:  Skin is warm, dry and intact.  No rash noted. Psychiatric: Mood and affect are normal. Speech and behavior are normal.  Normal judgement.  ____________________________________________   LABS (all labs ordered are listed, but only abnormal results are displayed)  Labs Reviewed  CBC  BASIC METABOLIC PANEL  TROPONIN I  BRAIN NATRIURETIC PEPTIDE   ____________________________________________  EKG  ED ECG REPORT I, Eula Listen, the attending physician, personally viewed and interpreted this ECG.   Date: 11/24/2015  EKG Time: 2155  Rate: 156  Rhythm: atrial fibrillation, with RVR  Axis: Normal   Intervals:none  ST&T Change: 2 mm of ST elevation in V1, V2. Left bundle branch block.  EKG is compared with 12/16 where she has a previous left bundle branch block.  ____________________________________________  RADIOLOGY  Dg Chest Portable 1 View  11/24/2015  CLINICAL DATA:  Atrial fibrillation with rapid ventricular response. EXAM: PORTABLE CHEST 1 VIEW COMPARISON:  10/06/2015 chest radiograph. FINDINGS: Pacer pad overlies the left chest. Surgical clips overlie the right upper lung. Stable cardiomediastinal silhouette with mild cardiomegaly. No pneumothorax. Hyperinflated lungs. Stable mild blunting of both costophrenic angles, cannot exclude trace pleural effusions. Mild pulmonary edema. IMPRESSION: 1. Mild congestive heart failure. 2. Hyperinflated lungs suggesting COPD. 3. Possible trace bilateral pleural effusions. Electronically Signed   By: Ilona Sorrel M.D.   On: 11/24/2015 22:45    ____________________________________________   PROCEDURES  Procedure(s) performed: None  Critical Care performed: Yes, see critical care note(s) ____________________________________________   INITIAL IMPRESSION / ASSESSMENT AND PLAN / ED COURSE  Pertinent labs & imaging results that were available during my care of the patient were reviewed by me and considered in my medical decision making (see chart for details).  82 y.o. female with a history of A. fib on Plavix, CAD with MI and stent placement, presenting with acute onset of chest pain, shortness breath and diaphoresis and EKG showing A. fib with RVR. I have given the patient a single dose of Cardizem which only briefly slow down her rate to the low 130s. I will put her on a titratable drip for better control of her rate. We will have it evaluate her for any acute findings of MI, ACS. I do not see evidence of DVT and she is not hypoxic so acute PE is much less likely. The patient will require admission to the hospital.  CRITICAL  CARE Performed by: Eula Listen   Total critical care time: 45 minutes  Critical care time was exclusive of separately billable procedures and treating other patients.  Critical care was necessary to treat or prevent imminent or life-threatening deterioration.  Critical care was time spent personally by me on the following activities: development of treatment plan with patient and/or surrogate as well as nursing, discussions with consultants, evaluation of patient's response to treatment, examination of patient, obtaining history from patient or surrogate, ordering and performing treatments and interventions, ordering and review of laboratory studies, ordering and review of radiographic studies, pulse oximetry and re-evaluation of patient's condition.  ----------------------------------------- 10:50 PM on 11/24/2015 -----------------------------------------  The patient is feeling significantly better since her arrival to the emergency department. She still continues to have a fast rate but is now in the 130s rather than up close to the 160s. Her blood pressure is maintaining well with the Cardizem drip which is now at 10 per hour. We will bump her up to see if we can get further control. I'm awaiting the results of her laboratory studies and then anticipate admission.   ____________________________________________  FINAL CLINICAL IMPRESSION(S) /  ED DIAGNOSES  Final diagnoses:  Atrial fibrillation with RVR (HCC)  Ischemic chest pain (HCC)  Shortness of breath  Diaphoresis      NEW MEDICATIONS STARTED DURING THIS VISIT:  New Prescriptions   No medications on file     Eula Listen, MD 11/24/15 2330

## 2015-11-24 NOTE — ED Notes (Signed)
Pt presents to ED via [facility] from personal home with c/o of substernal chest pain radiating left side. EMS states pt was given x3 nitroglycerin sublingual PTA by family. EMS states pt was found in SVT rhythm and was given in total 12 mg of adenosine. EMS reports pt hx of A Fib. Pt arrived to treatment room, HR 164 with CP present.

## 2015-11-25 ENCOUNTER — Encounter: Payer: Self-pay | Admitting: Internal Medicine

## 2015-11-25 ENCOUNTER — Encounter
Admission: EM | Disposition: A | Discharge status: Home / Self Care | Payer: Self-pay | Source: Home / Self Care | Attending: Internal Medicine

## 2015-11-25 ENCOUNTER — Inpatient Hospital Stay: Admit: 2015-11-25 | Payer: Medicare Other

## 2015-11-25 ENCOUNTER — Ambulatory Visit: Admission: RE | Admit: 2015-11-25 | Payer: Medicare Other | Source: Ambulatory Visit | Admitting: Ophthalmology

## 2015-11-25 DIAGNOSIS — N289 Disorder of kidney and ureter, unspecified: Secondary | ICD-10-CM

## 2015-11-25 DIAGNOSIS — R0602 Shortness of breath: Secondary | ICD-10-CM | POA: Diagnosis present

## 2015-11-25 DIAGNOSIS — I130 Hypertensive heart and chronic kidney disease with heart failure and stage 1 through stage 4 chronic kidney disease, or unspecified chronic kidney disease: Secondary | ICD-10-CM | POA: Diagnosis present

## 2015-11-25 DIAGNOSIS — Z880 Allergy status to penicillin: Secondary | ICD-10-CM | POA: Diagnosis not present

## 2015-11-25 DIAGNOSIS — R61 Generalized hyperhidrosis: Secondary | ICD-10-CM | POA: Diagnosis not present

## 2015-11-25 DIAGNOSIS — Z833 Family history of diabetes mellitus: Secondary | ICD-10-CM | POA: Diagnosis not present

## 2015-11-25 DIAGNOSIS — D649 Anemia, unspecified: Secondary | ICD-10-CM | POA: Diagnosis present

## 2015-11-25 DIAGNOSIS — Z9981 Dependence on supplemental oxygen: Secondary | ICD-10-CM | POA: Diagnosis not present

## 2015-11-25 DIAGNOSIS — J432 Centrilobular emphysema: Secondary | ICD-10-CM | POA: Diagnosis not present

## 2015-11-25 DIAGNOSIS — F419 Anxiety disorder, unspecified: Secondary | ICD-10-CM | POA: Diagnosis present

## 2015-11-25 DIAGNOSIS — Z888 Allergy status to other drugs, medicaments and biological substances status: Secondary | ICD-10-CM | POA: Diagnosis not present

## 2015-11-25 DIAGNOSIS — D638 Anemia in other chronic diseases classified elsewhere: Secondary | ICD-10-CM | POA: Diagnosis present

## 2015-11-25 DIAGNOSIS — N183 Chronic kidney disease, stage 3 (moderate): Secondary | ICD-10-CM | POA: Diagnosis present

## 2015-11-25 DIAGNOSIS — H9190 Unspecified hearing loss, unspecified ear: Secondary | ICD-10-CM | POA: Diagnosis present

## 2015-11-25 DIAGNOSIS — I4891 Unspecified atrial fibrillation: Secondary | ICD-10-CM

## 2015-11-25 DIAGNOSIS — M810 Age-related osteoporosis without current pathological fracture: Secondary | ICD-10-CM | POA: Diagnosis present

## 2015-11-25 DIAGNOSIS — Z882 Allergy status to sulfonamides status: Secondary | ICD-10-CM | POA: Diagnosis not present

## 2015-11-25 DIAGNOSIS — Z79899 Other long term (current) drug therapy: Secondary | ICD-10-CM | POA: Diagnosis not present

## 2015-11-25 DIAGNOSIS — Z955 Presence of coronary angioplasty implant and graft: Secondary | ICD-10-CM | POA: Diagnosis not present

## 2015-11-25 DIAGNOSIS — J9620 Acute and chronic respiratory failure, unspecified whether with hypoxia or hypercapnia: Secondary | ICD-10-CM | POA: Diagnosis present

## 2015-11-25 DIAGNOSIS — M109 Gout, unspecified: Secondary | ICD-10-CM | POA: Diagnosis present

## 2015-11-25 DIAGNOSIS — I252 Old myocardial infarction: Secondary | ICD-10-CM | POA: Diagnosis not present

## 2015-11-25 DIAGNOSIS — Z7982 Long term (current) use of aspirin: Secondary | ICD-10-CM | POA: Diagnosis not present

## 2015-11-25 DIAGNOSIS — G4730 Sleep apnea, unspecified: Secondary | ICD-10-CM | POA: Diagnosis present

## 2015-11-25 DIAGNOSIS — Z881 Allergy status to other antibiotic agents status: Secondary | ICD-10-CM | POA: Diagnosis not present

## 2015-11-25 DIAGNOSIS — Z7902 Long term (current) use of antithrombotics/antiplatelets: Secondary | ICD-10-CM | POA: Diagnosis not present

## 2015-11-25 DIAGNOSIS — J45909 Unspecified asthma, uncomplicated: Secondary | ICD-10-CM | POA: Diagnosis present

## 2015-11-25 DIAGNOSIS — Z87891 Personal history of nicotine dependence: Secondary | ICD-10-CM | POA: Diagnosis not present

## 2015-11-25 DIAGNOSIS — M1990 Unspecified osteoarthritis, unspecified site: Secondary | ICD-10-CM | POA: Diagnosis present

## 2015-11-25 DIAGNOSIS — G43909 Migraine, unspecified, not intractable, without status migrainosus: Secondary | ICD-10-CM | POA: Diagnosis present

## 2015-11-25 DIAGNOSIS — R0600 Dyspnea, unspecified: Secondary | ICD-10-CM | POA: Diagnosis not present

## 2015-11-25 DIAGNOSIS — E785 Hyperlipidemia, unspecified: Secondary | ICD-10-CM | POA: Diagnosis present

## 2015-11-25 DIAGNOSIS — I480 Paroxysmal atrial fibrillation: Secondary | ICD-10-CM | POA: Diagnosis present

## 2015-11-25 DIAGNOSIS — I447 Left bundle-branch block, unspecified: Secondary | ICD-10-CM | POA: Diagnosis present

## 2015-11-25 DIAGNOSIS — I5033 Acute on chronic diastolic (congestive) heart failure: Secondary | ICD-10-CM | POA: Diagnosis present

## 2015-11-25 DIAGNOSIS — E7800 Pure hypercholesterolemia, unspecified: Secondary | ICD-10-CM | POA: Diagnosis present

## 2015-11-25 DIAGNOSIS — N179 Acute kidney failure, unspecified: Secondary | ICD-10-CM | POA: Diagnosis present

## 2015-11-25 DIAGNOSIS — I25110 Atherosclerotic heart disease of native coronary artery with unstable angina pectoris: Secondary | ICD-10-CM | POA: Diagnosis present

## 2015-11-25 DIAGNOSIS — Z885 Allergy status to narcotic agent status: Secondary | ICD-10-CM | POA: Diagnosis not present

## 2015-11-25 DIAGNOSIS — Z85118 Personal history of other malignant neoplasm of bronchus and lung: Secondary | ICD-10-CM | POA: Diagnosis not present

## 2015-11-25 DIAGNOSIS — E249 Cushing's syndrome, unspecified: Secondary | ICD-10-CM | POA: Diagnosis present

## 2015-11-25 DIAGNOSIS — E1122 Type 2 diabetes mellitus with diabetic chronic kidney disease: Secondary | ICD-10-CM | POA: Diagnosis present

## 2015-11-25 DIAGNOSIS — F329 Major depressive disorder, single episode, unspecified: Secondary | ICD-10-CM | POA: Diagnosis present

## 2015-11-25 DIAGNOSIS — K219 Gastro-esophageal reflux disease without esophagitis: Secondary | ICD-10-CM | POA: Diagnosis present

## 2015-11-25 DIAGNOSIS — Z825 Family history of asthma and other chronic lower respiratory diseases: Secondary | ICD-10-CM | POA: Diagnosis not present

## 2015-11-25 DIAGNOSIS — J449 Chronic obstructive pulmonary disease, unspecified: Secondary | ICD-10-CM | POA: Diagnosis present

## 2015-11-25 DIAGNOSIS — Z8249 Family history of ischemic heart disease and other diseases of the circulatory system: Secondary | ICD-10-CM | POA: Diagnosis not present

## 2015-11-25 DIAGNOSIS — E860 Dehydration: Secondary | ICD-10-CM | POA: Diagnosis present

## 2015-11-25 HISTORY — DX: Edema, unspecified: R60.9

## 2015-11-25 HISTORY — DX: Cough: R05

## 2015-11-25 HISTORY — DX: Unspecified hearing loss, unspecified ear: H91.90

## 2015-11-25 HISTORY — DX: Atherosclerotic heart disease of native coronary artery without angina pectoris: I25.10

## 2015-11-25 HISTORY — DX: Cough, unspecified: R05.9

## 2015-11-25 LAB — TROPONIN I
TROPONIN I: 0.03 ng/mL (ref <0.031)
TROPONIN I: 0.03 ng/mL (ref <0.031)
TROPONIN I: 0.03 ng/mL (ref <0.031)

## 2015-11-25 LAB — BASIC METABOLIC PANEL
ANION GAP: 9 (ref 5–15)
BUN: 49 mg/dL — ABNORMAL HIGH (ref 6–20)
CHLORIDE: 103 mmol/L (ref 101–111)
CO2: 24 mmol/L (ref 22–32)
CREATININE: 1.59 mg/dL — AB (ref 0.44–1.00)
Calcium: 8.6 mg/dL — ABNORMAL LOW (ref 8.9–10.3)
GFR calc non Af Amer: 30 mL/min — ABNORMAL LOW (ref >60)
GFR, EST AFRICAN AMERICAN: 34 mL/min — AB (ref >60)
Glucose, Bld: 92 mg/dL (ref 65–99)
POTASSIUM: 4.5 mmol/L (ref 3.5–5.1)
SODIUM: 136 mmol/L (ref 135–145)

## 2015-11-25 LAB — CBC
HCT: 29.1 % — ABNORMAL LOW (ref 35.0–47.0)
HEMOGLOBIN: 9.5 g/dL — AB (ref 12.0–16.0)
MCH: 25.6 pg — ABNORMAL LOW (ref 26.0–34.0)
MCHC: 32.7 g/dL (ref 32.0–36.0)
MCV: 78.1 fL — AB (ref 80.0–100.0)
PLATELETS: 177 10*3/uL (ref 150–440)
RBC: 3.73 MIL/uL — AB (ref 3.80–5.20)
RDW: 15 % — ABNORMAL HIGH (ref 11.5–14.5)
WBC: 6.7 10*3/uL (ref 3.6–11.0)

## 2015-11-25 LAB — MRSA PCR SCREENING: MRSA BY PCR: NEGATIVE

## 2015-11-25 LAB — PROTIME-INR
INR: 1.13
PROTHROMBIN TIME: 14.7 s (ref 11.4–15.0)

## 2015-11-25 LAB — GLUCOSE, CAPILLARY: Glucose-Capillary: 149 mg/dL — ABNORMAL HIGH (ref 65–99)

## 2015-11-25 SURGERY — PHACOEMULSIFICATION, CATARACT, WITH IOL INSERTION
Anesthesia: Choice | Laterality: Right

## 2015-11-25 MED ORDER — FENOFIBRATE 54 MG PO TABS
54.0000 mg | ORAL_TABLET | Freq: Every day | ORAL | Status: DC
  Administered 2015-11-25 – 2015-11-26 (×2): 54 mg via ORAL
  Filled 2015-11-25 (×2): qty 1

## 2015-11-25 MED ORDER — AMIODARONE HCL IN DEXTROSE 360-4.14 MG/200ML-% IV SOLN
30.0000 mg/h | INTRAVENOUS | Status: DC
  Administered 2015-11-26: 30 mg/h via INTRAVENOUS
  Filled 2015-11-25 (×5): qty 200

## 2015-11-25 MED ORDER — CHOLECALCIFEROL 10 MCG (400 UNIT) PO TABS
800.0000 [IU] | ORAL_TABLET | Freq: Every day | ORAL | Status: DC
  Administered 2015-11-26: 800 [IU] via ORAL
  Filled 2015-11-25 (×2): qty 2

## 2015-11-25 MED ORDER — METOPROLOL TARTRATE 25 MG PO TABS
25.0000 mg | ORAL_TABLET | Freq: Two times a day (BID) | ORAL | Status: DC
  Administered 2015-11-25 (×2): 25 mg via ORAL
  Filled 2015-11-25 (×2): qty 1

## 2015-11-25 MED ORDER — CLOPIDOGREL BISULFATE 75 MG PO TABS
75.0000 mg | ORAL_TABLET | Freq: Every day | ORAL | Status: DC
  Administered 2015-11-25 – 2015-11-26 (×2): 75 mg via ORAL
  Filled 2015-11-25 (×2): qty 1

## 2015-11-25 MED ORDER — AMIODARONE HCL IN DEXTROSE 360-4.14 MG/200ML-% IV SOLN
60.0000 mg/h | INTRAVENOUS | Status: DC
  Administered 2015-11-25 (×2): 60 mg/h via INTRAVENOUS
  Filled 2015-11-25: qty 200

## 2015-11-25 MED ORDER — DOCUSATE SODIUM 100 MG PO CAPS: 100.0000 mg | ORAL_CAPSULE | Freq: Two times a day (BID) | ORAL | Status: DC | PRN

## 2015-11-25 MED ORDER — ALPRAZOLAM 0.5 MG PO TABS
0.2500 mg | ORAL_TABLET | Freq: Once | ORAL | Status: AC
  Administered 2015-11-25: 0.25 mg via ORAL
  Filled 2015-11-25: qty 1

## 2015-11-25 MED ORDER — FERROUS SULFATE 325 (65 FE) MG PO TABS
325.0000 mg | ORAL_TABLET | Freq: Every day | ORAL | Status: DC
  Administered 2015-11-25 – 2015-11-26 (×2): 325 mg via ORAL
  Filled 2015-11-25 (×2): qty 1

## 2015-11-25 MED ORDER — ASPIRIN EC 81 MG PO TBEC
81.0000 mg | DELAYED_RELEASE_TABLET | Freq: Every day | ORAL | Status: DC
  Administered 2015-11-25 – 2015-11-26 (×2): 81 mg via ORAL
  Filled 2015-11-25 (×2): qty 1

## 2015-11-25 MED ORDER — FUROSEMIDE 10 MG/ML IJ SOLN
40.0000 mg | Freq: Two times a day (BID) | INTRAMUSCULAR | Status: DC
  Administered 2015-11-25: 40 mg via INTRAVENOUS
  Filled 2015-11-25: qty 4

## 2015-11-25 MED ORDER — ENOXAPARIN SODIUM 100 MG/ML ~~LOC~~ SOLN
1.0000 mg/kg | Freq: Two times a day (BID) | SUBCUTANEOUS | Status: DC
  Administered 2015-11-25 (×2): 85 mg via SUBCUTANEOUS
  Filled 2015-11-25 (×2): qty 1

## 2015-11-25 MED ORDER — DILTIAZEM HCL 100 MG IV SOLR
5.0000 mg/h | INTRAVENOUS | Status: DC
  Administered 2015-11-25 (×2): 15 mg/h via INTRAVENOUS
  Administered 2015-11-25: 5 mg/h via INTRAVENOUS
  Filled 2015-11-25 (×3): qty 100

## 2015-11-25 MED ORDER — SERTRALINE HCL 50 MG PO TABS
50.0000 mg | ORAL_TABLET | Freq: Every day | ORAL | Status: DC
  Administered 2015-11-25 – 2015-11-26 (×2): 50 mg via ORAL
  Filled 2015-11-25 (×2): qty 1

## 2015-11-25 MED ORDER — CETYLPYRIDINIUM CHLORIDE 0.05 % MT LIQD
7.0000 mL | Freq: Two times a day (BID) | OROMUCOSAL | Status: DC
  Administered 2015-11-25: 7 mL via OROMUCOSAL

## 2015-11-25 MED ORDER — FEBUXOSTAT 40 MG PO TABS
40.0000 mg | ORAL_TABLET | Freq: Every day | ORAL | Status: DC
Start: 2015-11-25 — End: 2015-11-26
  Administered 2015-11-25 – 2015-11-26 (×2): 40 mg via ORAL
  Filled 2015-11-25 (×2): qty 1

## 2015-11-25 MED ORDER — ALPRAZOLAM 0.25 MG PO TABS
0.2500 mg | ORAL_TABLET | Freq: Every day | ORAL | Status: DC
  Administered 2015-11-25: 0.25 mg via ORAL
  Filled 2015-11-25: qty 1

## 2015-11-25 MED ORDER — CYANOCOBALAMIN 500 MCG PO TABS
500.0000 ug | ORAL_TABLET | Freq: Every day | ORAL | Status: DC
  Administered 2015-11-25 – 2015-11-26 (×2): 500 ug via ORAL
  Filled 2015-11-25 (×2): qty 1

## 2015-11-25 MED ORDER — ATORVASTATIN CALCIUM 10 MG PO TABS
10.0000 mg | ORAL_TABLET | Freq: Every day | ORAL | Status: DC
  Administered 2015-11-25: 10 mg via ORAL
  Filled 2015-11-25: qty 1

## 2015-11-25 MED ORDER — OCUVITE-LUTEIN PO CAPS
1.0000 | ORAL_CAPSULE | Freq: Every day | ORAL | Status: DC
Start: 2015-11-25 — End: 2015-11-26
  Administered 2015-11-25 – 2015-11-26 (×2): 1 via ORAL
  Filled 2015-11-25 (×2): qty 1

## 2015-11-25 MED ORDER — AMIODARONE LOAD VIA INFUSION
150.0000 mg | Freq: Once | INTRAVENOUS | Status: AC
  Administered 2015-11-25: 150 mg via INTRAVENOUS
  Filled 2015-11-25: qty 83.34

## 2015-11-25 MED ORDER — DIGOXIN 0.25 MG/ML IJ SOLN
0.2500 mg | INTRAMUSCULAR | Status: AC
  Administered 2015-11-25: 0.25 mg via INTRAVENOUS
  Filled 2015-11-25: qty 2

## 2015-11-25 MED ORDER — NITROGLYCERIN 0.4 MG SL SUBL: 0.4000 mg | SUBLINGUAL_TABLET | SUBLINGUAL | Status: DC | PRN

## 2015-11-25 NOTE — H&P (Signed)
[place] [facility] [place] NAME: Regina Burke    MR#:  [id number]  DATE OF BIRTH:  [birth year]  DATE OF ADMISSION:  11/24/2015  PRIMARY CARE PHYSICIAN: Tracie Harrier, MD   REQUESTING/REFERRING PHYSICIAN:   CHIEF COMPLAINT:   Chief Complaint  Patient presents with  . Chest Pain    HISTORY OF PRESENT ILLNESS: Regina Burke  is a 82 y.o. female with a known history of Diastolic heart failure, COPD, hypertension, atrial fibrillation, hyperlipidemia, lung cancer presented to the emergency room with increased shortness of breath and chest pain. Both of the above complaint started around 7:30 PM last night. Patients chest pain was located retrosternally. It was aching in nature 10 out of 10 on a scale of 1-10. Felt short of breath and currently uses home oxygen. Patient presented to the emergency room her heart rate was around 140 bpm. She was having wide-complex tachycardia secondary A. fib and started on IV Cardizem drip and rate was controlled. Her shortness of breath also improved. First set of troponin was negative. No fever or chills. No history of cough. No history of headache dizziness blurry vision. No history of any syncope or seizure.  PAST MEDICAL HISTORY:   Past Medical History  Diagnosis Date  . COPD (chronic obstructive pulmonary disease) ([place])   . CHF (congestive heart failure) ([place])   . Hypertension   . Rapid palpitations 2014    Seen at [facility], may have been atrial fibrillation  . DOE (dyspnea on exertion)     Started after treatment for lung cancer  . High cholesterol   . Heart murmur   . Myocardial infarction ([place]) 12/22/2014   . Lung cancer ([place]) dx'd 2014    S/P radiation 2015  . Sleep apnea   . Cushing's disease ([place])   . Type II diabetes mellitus ([place])   . Anemia   . GERD (gastroesophageal reflux disease)   . History of hiatal hernia   . Migraine   . Arthritis   . Gout   . Depression   . Asthma   . Dysrhythmia    . Coronary artery disease   . HOH (hard of hearing)   . Chronic kidney disease (CKD), stage III (moderate)     INCREASED KT  . Anginal pain ([place])   . Cough     CHRONIC AT NIGHT  . Edema     FEET/LEGS  . Wheezing     PAST SURGICAL HISTORY: Past Surgical History  Procedure Laterality Date  . Adrenalectomy Left 1980's    "[place]ings"  . Abdominal hysterectomy    . Appendectomy    . Cholecystectomy    . Coronary angioplasty with stent placement  12/23/2014  . Tonsillectomy    . Fracture surgery    . Wrist fracture surgery Bilateral ~ 2000  . Breast cyst excision Left   . Tubal ligation    . Left heart catheterization with coronary angiogram N/A 12/23/2014    Procedure: LEFT HEART CATHETERIZATION WITH CORONARY ANGIOGRAM;  Surgeon: Burnell Blanks, MD;  Location: [facility] CATH LAB;  Service: Cardiovascular;  Laterality: N/A;  . Percutaneous coronary stent intervention (pci-s)  12/23/2014    Procedure: PERCUTANEOUS CORONARY STENT INTERVENTION (PCI-S);  Surgeon: Burnell Blanks, MD;  Location: [facility] CATH LAB;  Service: Cardiovascular;;  Promus 2.25x8    SOCIAL HISTORY:  Social History  Substance Use Topics  . Smoking status: Former Smoker -- 1.00 packs/day for 45 years    Types: Cigarettes  Quit date: 04/25/1998  . Smokeless tobacco: Never Used  . Alcohol Use: No     Comment: 12/23/2014 "might have a couple mixed drinks/year"    FAMILY HISTORY:  Family History  Problem Relation Age of Onset  . Heart disease Mother   . Diabetes Mother   . Osteoarthritis Mother   . Hypertension Mother   . Heart disease Father   . Hypertension Father   . COPD Brother     DRUG ALLERGIES:  Allergies  Allergen Reactions  . Ciprofloxacin Shortness Of Breath, Itching and Rash  . Doxycycline Shortness Of Breath, Itching and Rash  . Penicillins Shortness Of Breath, Itching and Rash  . Sulfa Antibiotics Shortness Of Breath, Itching and Rash  . Morphine And Related Itching    REVIEW OF  SYSTEMS:   CONSTITUTIONAL: No fever, has weakness.  EYES: No blurred or double vision.  EARS, NOSE, AND THROAT: No tinnitus or ear pain.  RESPIRATORY: No cough,has shortness of breath, no wheezing or hemoptysis.  CARDIOVASCULAR: Has chest pain, no orthopnea, edema.  GASTROINTESTINAL: No nausea, vomiting, diarrhea or abdominal pain.  GENITOURINARY: No dysuria, hematuria.  ENDOCRINE: No polyuria, nocturia,  HEMATOLOGY: No anemia, easy bruising or bleeding SKIN: No rash or lesion. MUSCULOSKELETAL: No joint pain or arthritis.   NEUROLOGIC: No tingling, numbness, weakness.  PSYCHIATRY: No anxiety or depression.   MEDICATIONS AT HOME:  Prior to Admission medications   Medication Sig Start Date End Date Taking? Authorizing Provider  ALPRAZolam (XANAX) 0.25 MG tablet Take 1 tablet (0.25 mg total) by mouth 3 (three) times daily as needed for anxiety. Patient taking differently: Take 0.25 mg by mouth 3 (three) times daily as needed for anxiety. TAKES AM 05/24/15   Tracie Harrier, MD  aspirin EC 81 MG tablet Take 1 tablet (81 mg total) by mouth daily. 04/13/15   Troy Sine, MD  atorvastatin (LIPITOR) 10 MG tablet Take 10 mg by mouth daily.    Historical Provider, MD  cholecalciferol (VITAMIN D) 400 UNITS TABS tablet Take 800 Units by mouth daily.     Historical Provider, MD  clopidogrel (PLAVIX) 75 MG tablet Take 1 tablet (75 mg total) by mouth daily. 12/24/14   Rhonda G Barrett, PA-C  Cyanocobalamin (VITAMIN B-12) 500 MCG SUBL Place 500 mcg under the tongue daily.    Historical Provider, MD  diltiazem (DILACOR XR) 240 MG 24 hr capsule Take 1 capsule (240 mg total) by mouth daily. 06/15/15   Tracie Harrier, MD  docusate sodium (COLACE) 100 MG capsule Take 1 capsule (100 mg total) by mouth 2 (two) times daily as needed for mild constipation. 05/24/15   Vishwanath Hande, MD  febuxostat (ULORIC) 40 MG tablet Take 40 mg by mouth daily.     Historical Provider, MD  fenofibrate (TRICOR) 48 MG tablet  Take 48 mg by mouth daily.    Historical Provider, MD  ferrous sulfate 325 (65 FE) MG EC tablet Take 325 mg by mouth daily.     Historical Provider, MD  furosemide (LASIX) 20 MG tablet Take 1 tablet (20 mg total) by mouth daily. 06/15/15   Tracie Harrier, MD  metoprolol succinate (TOPROL-XL) 100 MG 24 hr tablet Take 1 tablet (100 mg total) by mouth daily. Take with or immediately following a meal. 12/24/14   Evelene Croon Barrett, PA-C  Multiple Vitamins-Minerals (PRESERVISION AREDS 2) CAPS Take 1 capsule by mouth daily.    Historical Provider, MD  nitroGLYCERIN (NITROSTAT) 0.4 MG SL tablet Place 1 tablet (0.4 mg total)  under the tongue every 5 (five) minutes as needed for chest pain. 12/24/14   Rhonda G Barrett, PA-C  sertraline (ZOLOFT) 25 MG tablet Take 50 mg by mouth daily.     Historical Provider, MD      PHYSICAL EXAMINATION:   VITAL SIGNS: Blood pressure 114/80, pulse 125, temperature 97.8 F (36.6 C), temperature source Oral, resp. rate 12, height '5\' 4"'$  (1.626 m), weight 86.183 kg (190 lb), SpO2 98 %.  GENERAL:  82 y.o.-year-old patient lying in the bed with no acute distress.  EYES: Pupils equal, round, reactive to light and accommodation. No scleral icterus. Extraocular muscles intact.  HEENT: Head atraumatic, normocephalic. Oropharynx and nasopharynx clear.  NECK:  Supple, no jugular venous distention. No thyroid enlargement, no tenderness.  LUNGS: Normal breath sounds bilaterally, basal crepitations heard. No use of accessory muscles of respiration.  CARDIOVASCULAR: S1, S2 normal. No murmurs, rubs, or gallops.  ABDOMEN: Soft, nontender, nondistended. Bowel sounds present. No organomegaly or mass.  EXTREMITIES: No pedal edema, cyanosis, or clubbing.  NEUROLOGIC: Cranial nerves II through XII are intact. Muscle strength 5/5 in all extremities. Sensation intact. Gait normal PSYCHIATRIC: The patient is alert and oriented x 3.  SKIN: No obvious rash, lesion, or ulcer.   LABORATORY PANEL:    CBC  Recent Labs Lab 11/24/15 2222  WBC 7.5  HGB 9.7*  HCT 28.7*  PLT 158  MCV 78.5*  MCH 26.4  MCHC 33.7  RDW 15.1*   ------------------------------------------------------------------------------------------------------------------  Chemistries   Recent Labs Lab 11/24/15 2222  NA 137  K 4.3  CL 102  CO2 26  GLUCOSE 125*  BUN 49*  CREATININE 1.45*  CALCIUM 8.5*   ------------------------------------------------------------------------------------------------------------------ estimated creatinine clearance is 32.9 mL/min (by C-G formula based on Cr of 1.45). ------------------------------------------------------------------------------------------------------------------ No results for input(s): TSH, T4TOTAL, T3FREE, THYROIDAB in the last 72 hours.  Invalid input(s): FREET3   Coagulation profile No results for input(s): INR, PROTIME in the last 168 hours. ------------------------------------------------------------------------------------------------------------------- No results for input(s): DDIMER in the last 72 hours. -------------------------------------------------------------------------------------------------------------------  Cardiac Enzymes  Recent Labs Lab 11/24/15 2222  TROPONINI <0.03   ------------------------------------------------------------------------------------------------------------------ Invalid input(s): POCBNP  ---------------------------------------------------------------------------------------------------------------  Urinalysis    Component Value Date/Time   COLORURINE YELLOW* 05/08/2015 0838   APPEARANCEUR CLEAR* 05/08/2015 0838   LABSPEC 1.011 05/08/2015 0838   PHURINE 5.0 05/08/2015 0838   GLUCOSEU NEGATIVE 05/08/2015 0838   HGBUR NEGATIVE 05/08/2015 0838   BILIRUBINUR NEGATIVE 05/08/2015 0838   KETONESUR NEGATIVE 05/08/2015 0838   PROTEINUR NEGATIVE 05/08/2015 0838   NITRITE NEGATIVE 05/08/2015 0838    LEUKOCYTESUR NEGATIVE 05/08/2015 0838     RADIOLOGY: Dg Chest Portable 1 View  11/24/2015  CLINICAL DATA:  Atrial fibrillation with rapid ventricular response. EXAM: PORTABLE CHEST 1 VIEW COMPARISON:  10/06/2015 chest radiograph. FINDINGS: Pacer pad overlies the left chest. Surgical clips overlie the right upper lung. Stable cardiomediastinal silhouette with mild cardiomegaly. No pneumothorax. Hyperinflated lungs. Stable mild blunting of both costophrenic angles, cannot exclude trace pleural effusions. Mild pulmonary edema. IMPRESSION: 1. Mild congestive heart failure. 2. Hyperinflated lungs suggesting COPD. 3. Possible trace bilateral pleural effusions. Electronically Signed   By: Ilona Sorrel M.D.   On: 11/24/2015 22:45    EKG: Orders placed or performed during the hospital encounter of 11/24/15  . EKG 12-Lead  . EKG 12-Lead  . ED EKG  . ED EKG    IMPRESSION AND PLAN: 82 year old female patient with history of diastolic heart failure, atrial fibrillation, hypertension, hyperlipidemia, COPD, coronary artery disease presented to  the emergency room with shortness of breath and rapid heart rate. Admitting diagnosis 1. Decompensated heart failure 2. Atrial fibrillation with rapid rate 3. Unstable angina 4. COPD 5. Acute on chronic diastolic heart failure Treatment plan Admit patient to telemetry under inpatient service Resume aspirin and Plavix Diurese with IV Lasix. Start anticoagulation with full dose Lovenox subcutaneously IV Cardizem drip for rate control [facility] cardiology consultation Cycle troponin check for ischemia Check echocardiogram Supportive care  All the records are reviewed and case discussed with ED provider. Management plans discussed with the patient, family and they are in agreement.  CODE STATUS:FULL Code Status History    Date Active Date Inactive Code Status Order ID Comments User Context   06/14/2015  6:57 PM 06/15/2015  8:54 PM DNR [id number]   Colleen Can, MD Inpatient   06/07/2015 10:20 PM 06/14/2015  6:56 PM Full Code [id number]  Lytle Butte, MD ED   05/15/2015  3:27 PM 05/24/2015  5:04 PM Full Code [id number]  Gladstone Lighter, MD ED   05/08/2015 11:23 AM 05/11/2015  3:13 PM Full Code [id number]  Dustin Flock, MD ED   04/15/2015 12:01 AM 04/15/2015  7:03 PM Full Code [id number]  Lance Coon, MD Inpatient   12/23/2014  1:09 PM 12/24/2014  5:17 PM Full Code [id number]  Burnell Blanks, MD Inpatient   12/23/2014  4:22 AM 12/23/2014  1:09 PM Full Code [id number]  Alwyn Pea, MD ED    Questions for Most Recent Historical Code Status (Order [id number])    Question Answer Comment   In the event of cardiac or respiratory ARREST Do not call a "code blue"    In the event of cardiac or respiratory ARREST Do not perform Intubation, CPR, defibrillation or ACLS    In the event of cardiac or respiratory ARREST Use medication by any route, position, wound care, and other measures to relive pain and suffering. May use oxygen, suction and manual treatment of airway obstruction as needed for comfort.    Comments Patient would want ELECTIVE INTUBATION for a short time IF that would help and if its not in a code type situation.        TOTAL TIME TAKING CARE OF THIS PATIENT: 55 minutes.    Saundra Shelling M.D on 11/25/2015 at 1:16 AM  Between 7am to 6pm - Pager - [phone]  After 6pm go to [email or web address] - password [email or web address] [place] [facility]  Office  [phone]  CC: Primary care physician; Tracie Harrier, MD

## 2015-11-25 NOTE — ED Notes (Signed)
Will page doc for meal tray order

## 2015-11-25 NOTE — ED Notes (Addendum)
Sandwich tray given  Per MD

## 2015-11-25 NOTE — ED Notes (Signed)
Cardizem drip completed. Pharm sending up another bag for cont drip. Will resume when Cardizem is at bedside

## 2015-11-25 NOTE — Progress Notes (Signed)
ANTICOAGULATION CONSULT NOTE - Initial Consult  Pharmacy Consult for enoxaparin dosing Indication: atrial fibrillation  Allergies  Allergen Reactions  . Ciprofloxacin Shortness Of Breath, Itching and Rash  . Doxycycline Shortness Of Breath, Itching and Rash  . Penicillins Shortness Of Breath, Itching and Rash  . Sulfa Antibiotics Shortness Of Breath, Itching and Rash  . Morphine And Related Itching    Patient Measurements: Height: '5\' 4"'$  (162.6 cm) Weight: 190 lb (86.183 kg) IBW/kg (Calculated) : 54.7  Vital Signs: BP: 132/67 mmHg (03/30 1000) Pulse Rate: 79 (03/30 1000)  Labs:  Recent Labs  11/24/15 2222 11/25/15 0923  HGB 9.7* 9.5*  HCT 28.7* 29.1*  PLT 158 177  LABPROT  --  14.7  INR  --  1.13  CREATININE 1.45* 1.59*  TROPONINI <0.03 0.03    Estimated Creatinine Clearance: 30 mL/min (by C-G formula based on Cr of 1.59).  Assessment: Pharmacy consulted to dose enoxaparin for afib in this 82 year old female with history of paroxysmal afib. Not on anticoagulation PTA.   Plan:  Ordered enoxaparin '85mg'$  SQ Q12H. Will follow renal function, will need to reduce to Q24H if CrCl < 39m/min.  Regina Burke C 11/25/2015,10:44 AM

## 2015-11-25 NOTE — ED Notes (Signed)
Report received 

## 2015-11-25 NOTE — Progress Notes (Signed)
[place] at [place] NAME: Regina Burke    MR#:  [id number]  DATE OF BIRTH:  05-06-1933  SUBJECTIVE:  CHIEF COMPLAINT:   Chief Complaint  Patient presents with  . Chest Pain   Continues to be tachycardic. Shortness of breath mildly improved  REVIEW OF SYSTEMS:    Review of Systems  Constitutional: Positive for malaise/fatigue. Negative for fever and chills.  HENT: Negative for sore throat.   Eyes: Negative for blurred vision, double vision and pain.  Respiratory: Positive for shortness of breath. Negative for cough, hemoptysis and wheezing.   Cardiovascular: Positive for chest pain and palpitations. Negative for orthopnea and leg swelling.  Gastrointestinal: Negative for heartburn, nausea, vomiting, abdominal pain, diarrhea and constipation.  Genitourinary: Negative for dysuria and hematuria.  Musculoskeletal: Negative for back pain and joint pain.  Skin: Negative for rash.  Neurological: Positive for weakness. Negative for sensory change, speech change, focal weakness and headaches.  Endo/Heme/Allergies: Does not bruise/bleed easily.  Psychiatric/Behavioral: Negative for depression. The patient is not nervous/anxious.     DRUG ALLERGIES:   Allergies  Allergen Reactions  . Ciprofloxacin Shortness Of Breath, Itching and Rash  . Doxycycline Shortness Of Breath, Itching and Rash  . Penicillins Shortness Of Breath, Itching and Rash  . Sulfa Antibiotics Shortness Of Breath, Itching and Rash  . Morphine And Related Itching    VITALS:  Blood pressure 114/72, pulse 130, temperature 97.8 F (36.6 C), temperature source Oral, resp. rate 21, height '5\' 4"'$  (1.626 m), weight 86.183 kg (190 lb), SpO2 98 %.  PHYSICAL EXAMINATION:   Physical Exam  GENERAL:  82 y.o.-year-old patient lying in the bed with no acute distress.  EYES: Pupils equal, round, reactive to light and accommodation. No scleral icterus. Extraocular muscles  intact.  HEENT: Head atraumatic, normocephalic. Oropharynx and nasopharynx clear.  NECK:  Supple, no jugular venous distention. No thyroid enlargement, no tenderness.  LUNGS: Normal breath sounds bilaterally, no wheezing, rales, rhonchi. No use of accessory muscles of respiration.  CARDIOVASCULAR: S1, S2. Irregularly irregular. Tachycardic ABDOMEN: Soft, nontender, nondistended. Bowel sounds present. No organomegaly or mass.  EXTREMITIES: No cyanosis, clubbing. Bilateral lower extremity edema NEUROLOGIC: Cranial nerves II through XII are intact. No focal Motor or sensory deficits b/l.   PSYCHIATRIC: The patient is alert and oriented x 3.  SKIN: No obvious rash, lesion, or ulcer.   LABORATORY PANEL:   CBC  Recent Labs Lab 11/25/15 0923  WBC 6.7  HGB 9.5*  HCT 29.1*  PLT 177   ------------------------------------------------------------------------------------------------------------------ Chemistries   Recent Labs Lab 11/25/15 0923  NA 136  K 4.5  CL 103  CO2 24  GLUCOSE 92  BUN 49*  CREATININE 1.59*  CALCIUM 8.6*   ------------------------------------------------------------------------------------------------------------------  Cardiac Enzymes  Recent Labs Lab 11/25/15 1455  TROPONINI 0.03   ------------------------------------------------------------------------------------------------------------------  RADIOLOGY:  Dg Chest Portable 1 View  11/24/2015  CLINICAL DATA:  Atrial fibrillation with rapid ventricular response. EXAM: PORTABLE CHEST 1 VIEW COMPARISON:  10/06/2015 chest radiograph. FINDINGS: Pacer pad overlies the left chest. Surgical clips overlie the right upper lung. Stable cardiomediastinal silhouette with mild cardiomegaly. No pneumothorax. Hyperinflated lungs. Stable mild blunting of both costophrenic angles, cannot exclude trace pleural effusions. Mild pulmonary edema. IMPRESSION: 1. Mild congestive heart failure. 2. Hyperinflated lungs suggesting  COPD. 3. Possible trace bilateral pleural effusions. Electronically Signed   By: Ilona Sorrel M.D.   On: 11/24/2015 22:45     ASSESSMENT AND PLAN:   *  Atrial fibrillation with rapid ventricular rate Patient maxed out on Cardizem with heart rate in the 130s. Stat dose of IV digoxin given earlier. Later started on metoprolol twice a day. Will monitor and stepdown unit. On therapeutic dose Lovenox Cardiology consult  *  Acute on Chronic diastolic congestive heart failure - IV Lasix, Beta blockers - Input and Output - Counseled to limit fluids and Salt - Monitor Bun/Cr and Potassium - Echo  * Acute respiratory failure due to CHF. Continue oxygen. Wean as tolerated.  * CKD stage III Monitor while being diuresed.  * Anemia of chronic disease  * DVT prophylaxis. Patient on Lovenox   All the records are reviewed and case discussed with Care Management/Social Workerr. Management plans discussed with the patient, family and they are in agreement.  CODE STATUS: FULL  DVT Prophylaxis: SCDs  TOTAL CRITICAL CARE TIME TAKING CARE OF THIS PATIENT: 35 minutes.    Hillary Bow R M.D on 11/25/2015 at 4:05 PM  Between 7am to 6pm - Pager - [phone]  After 6pm go to [email or web address] - password [email or web address] [place] [facility]  Office  [phone]  CC: Primary care physician; Tracie Harrier, MD  Note: This dictation was prepared with Dragon dictation along with smaller phrase technology. Any transcriptional errors that result from this process are unintentional.

## 2015-11-25 NOTE — Consult Note (Signed)
Cardiology Consultation Note  Patient ID: Regina Burke, MRN: [id number], DOB/AGE: 10/25/1932 82 y.o. Admit date: 11/24/2015   Date of Consult: 11/25/2015 Primary Physician: Tracie Harrier, MD Primary Cardiologist: Dr. Rockey Situ, MD  Chief Complaint: Chest pain Reason for Consult: Afib with RVR, acute on chronic diastolic CHF, and chest pain  HPI: 82 y.o. female with h/o CAD s/p PCI to OM 11/2014, PAF not on long term full-dose anticoagulation given profound anemia, chronic respiratory failure on home oxygen at 2 L, chronic diastolic CHF, lung carcinoma s/p radiation 2015, COPD, CKD stage III, chronic anemia, Cushing's disease, history of digoxin toxicity, and DM who presented to [name] on 3/30 with Afib with RVR and chest pressure.   Prior admission to [facility] 11/2014 with chest pain. Cardiac cath showed 50% stenosis of diagonal branch of LAD but high-grade 90% hazy stenosis in the OM branch of LCx. She underwent successful PTCA and placement of 2.25 x 8 mm Promus premier DES at the ostium of the OM3. Echo during that admission showed EF of 60-65%, GR1DD, left atrium was moderately dilated, atrial septum was increased in thickness c/w lipomatous hypertrophy. Most recent echo from 04/2015 showed EF 55-60%, LA was mildly dilated, RV cavity size was mildly dilated, wall thickness was normal. She was readmitted 05/2015 with SOB and was found to have Afib, K= of 7, renal failure, and anemia. SHe was not started on anticoagulation given her anemia and DAPT. At her last office visit it is unclear how she was feeling. She was continued on current medications.   She presented to [facility] on 3/30 with increased SOB and chest pressure. She typically develops chest pressure when she goes into Afib. She was doing a fair amount of cleaning in her house on 3/29, putting up winter clothes and climbing stairs, which is what she thinks led to her going into Afib with RVR. She has not required increased demand from her home O2.  Weight has been stable and she has not had any LEE, orthopnea, or early satiety.    Upon the patient's arrival to [facility] they were found to have troponin negative x 3, BNP 300, hgb 9.7-->9.5 (baseline approximately 10-9.5), SCr 1.45-->1.59, K+ 4.5. ECG showed Afib with RVR, 156 bpm, LBBB (old when compared to 08/12/2015), CXR showed mild cardiomegaly, COPD, possible trace pleural effusions. She has received Cardizem injection of 10 mg, diltiazem infusion, and digoxin 0.25 with her rate now in the 70s in Afib. Since developing rate-controlled Afib she no longer has chest pressure.   Past Medical History  Diagnosis Date  . COPD (chronic obstructive pulmonary disease) ([place])   . CHF (congestive heart failure) ([place])   . Hypertension   . Rapid palpitations 2014    Se[facility] at [facility], may have been atrial fibrillation  . DOE (dyspnea on exertion)     Started after treatment for lung cancer  . High cholesterol   . Heart murmur   . Myocardial infarction ([place]) 12/22/2014   . Lung cancer ([place]) dx'd 2014    S/P radiation 2015  . Sleep apnea   . Cushing's disease ([place])   . Type II diabetes mellitus ([place])   . Anemia   . GERD (gastroesophageal reflux disease)   . History of hiatal hernia   . Migraine   . Arthritis   . Gout   . Depression   . Asthma   . Dysrhythmia   . Coronary artery disease   . HOH (hard of hearing)   . Chronic kidney disease (  CKD), stage III (moderate)     INCREASED KT  . Anginal pain ([place])   . Cough     CHRONIC AT NIGHT  . Edema     FEET/LEGS  . Wheezing       Most Recent Cardiac Studies: Echo 05/17/2015: Study Conclusions  - Left ventricle: Wall thickness was increased in a pattern of mild  LVH. Systolic function was normal. The estimated ejection  fraction was in the range of 55% to 60%. - Aortic valve: Valve area (Vmax): 1.49 cm^2. - Left atrium: The atrium was mildly dilated. - Right ventricle: The cavity size was mildly dilated. Wall  thickness was normal. -  Pulmonic valve: Peak gradient (S): 14 mm Hg.   Surgical History:  Past Surgical History  Procedure Laterality Date  . Adrenalectomy Left 1980's    "Cushings"  . Abdominal hysterectomy    . Appendectomy    . Cholecystectomy    . Coronary angioplasty with stent placement  12/23/2014  . Tonsillectomy    . Fracture surgery    . Wrist fracture surgery Bilateral ~ 2000  . Breast cyst excision Left   . Tubal ligation    . Left heart catheterization with coronary angiogram N/A 12/23/2014    Procedure: LEFT HEART CATHETERIZATION WITH CORONARY ANGIOGRAM;  Surgeon: Burnell Blanks, MD;  Location: [facility] CATH LAB;  Service: Cardiovascular;  Laterality: N/A;  . Percutaneous coronary stent intervention (pci-s)  12/23/2014    Procedure: PERCUTANEOUS CORONARY STENT INTERVENTION (PCI-S);  Surgeon: Burnell Blanks, MD;  Location: [facility] CATH LAB;  Service: Cardiovascular;;  Promus 2.25x8     Home Meds: Prior to Admission medications   Medication Sig Start Date End Date Taking? Authorizing Provider  ALPRAZolam (XANAX) 0.25 MG tablet Take 1 tablet (0.25 mg total) by mouth 3 (three) times daily as needed for anxiety. Patient taking differently: Take 0.25 mg by mouth at bedtime.  05/24/15  Yes Vishwanath Hande, MD  aspirin EC 81 MG tablet Take 1 tablet (81 mg total) by mouth daily. Patient taking differently: Take 81 mg by mouth every evening.  04/13/15  Yes Troy Sine, MD  atorvastatin (LIPITOR) 10 MG tablet Take 10 mg by mouth every evening.    Yes Historical Provider, MD  cholecalciferol (VITAMIN D) 400 UNITS TABS tablet Take 800 Units by mouth every evening.    Yes Historical Provider, MD  clopidogrel (PLAVIX) 75 MG tablet Take 1 tablet (75 mg total) by mouth daily. Patient taking differently: Take 75 mg by mouth every evening.  12/24/14  Yes Rhonda G Barrett, PA-C  cyanocobalamin 500 MCG tablet Take 500 mcg by mouth every evening.   Yes Historical Provider, MD  diltiazem (DILACOR XR) 240 MG 24  hr capsule Take 1 capsule (240 mg total) by mouth daily. Patient taking differently: Take 300 mg by mouth every evening.  06/15/15  Yes Vishwanath Hande, MD  febuxostat (ULORIC) 40 MG tablet Take 40 mg by mouth every evening.    Yes Historical Provider, MD  fenofibrate (TRICOR) 48 MG tablet Take 48 mg by mouth every evening.    Yes Historical Provider, MD  ferrous sulfate 325 (65 FE) MG EC tablet Take 325 mg by mouth every evening.    Yes Historical Provider, MD  furosemide (LASIX) 20 MG tablet Take 1 tablet (20 mg total) by mouth daily. Patient taking differently: Take 20 mg by mouth every evening.  06/15/15  Yes Vishwanath Hande, MD  hydrochlorothiazide (HYDRODIURIL) 12.5 MG tablet Take 12.5 mg by  mouth every evening.   Yes Historical Provider, MD  metoprolol succinate (TOPROL-XL) 100 MG 24 hr tablet Take 1 tablet (100 mg total) by mouth daily. Take with or immediately following a meal. Patient taking differently: Take 100 mg by mouth every evening.  12/24/14  Yes Rhonda G Barrett, PA-C  Multiple Vitamins-Minerals (PRESERVISION AREDS 2) CAPS Take 1 capsule by mouth every evening.    Yes Historical Provider, MD  nitroGLYCERIN (NITROSTAT) 0.4 MG SL tablet Place 1 tablet (0.4 mg total) under the tongue every 5 (five) minutes as needed for chest pain. 12/24/14  Yes Rhonda G Barrett, PA-C  sertraline (ZOLOFT) 50 MG tablet Take 50 mg by mouth every evening.   Yes Historical Provider, MD    Inpatient Medications:  . aspirin EC  81 mg Oral Daily  . atorvastatin  10 mg Oral Daily  . cholecalciferol  800 Units Oral Daily  . clopidogrel  75 mg Oral Daily  . cyanocobalamin  500 mcg Oral Daily  . enoxaparin (LOVENOX) injection  1 mg/kg Subcutaneous Q12H  . febuxostat  40 mg Oral Daily  . fenofibrate  54 mg Oral Daily  . ferrous sulfate  325 mg Oral Daily  . furosemide  40 mg Intravenous Q12H  . metoprolol tartrate  25 mg Oral BID  . multivitamin-lutein  1 capsule Oral Daily  . sertraline  50 mg Oral  Daily   . diltiazem (CARDIZEM) infusion 15 mg/hr (11/25/15 1034)    Allergies:  Allergies  Allergen Reactions  . Ciprofloxacin Shortness Of Breath, Itching and Rash  . Doxycycline Shortness Of Breath, Itching and Rash  . Penicillins Shortness Of Breath, Itching and Rash  . Sulfa Antibiotics Shortness Of Breath, Itching and Rash  . Morphine And Related Itching    Social History   Social History  . Marital Status: Widowed    Spouse Name: N/A  . Number of Children: N/A  . Years of Education: N/A   Occupational History  . retired    Social History Main Topics  . Smoking status: Former Smoker -- 1.00 packs/day for 45 years    Types: Cigarettes    Quit date: 04/25/1998  . Smokeless tobacco: Never Used  . Alcohol Use: No     Comment: 12/23/2014 "might have a couple mixed drinks/year"  . Drug Use: No  . Sexual Activity: No   Other Topics Concern  . Not on file   Social History Narrative     Family History  Problem Relation Age of Onset  . Heart disease Mother   . Diabetes Mother   . Osteoarthritis Mother   . Hypertension Mother   . Heart disease Father   . Hypertension Father   . COPD Brother      Review of Systems: Review of Systems  Constitutional: Positive for malaise/fatigue. Negative for fever, chills, weight loss and diaphoresis.  HENT: Negative for congestion.   Eyes: Negative for discharge and redness.  Respiratory: Positive for shortness of breath. Negative for cough, hemoptysis, sputum production and wheezing.   Cardiovascular: Positive for chest pain and palpitations. Negative for orthopnea, claudication, leg swelling and PND.       Typically gets chest pressure with her Afib  Gastrointestinal: Positive for nausea. Negative for vomiting and abdominal pain.  Musculoskeletal: Negative for myalgias and falls.  Skin: Negative for rash.  Neurological: Positive for weakness. Negative for dizziness, tingling, tremors, sensory change, speech change and focal  weakness.  Endo/Heme/Allergies: Does not bruise/bleed easily.  Psychiatric/Behavioral: Negative for substance  abuse. The patient is not nervous/anxious.   All other systems reviewed and are negative.   Labs:  Recent Labs  11/24/15 2222 11/25/15 0923 11/25/15 1455  TROPONINI <0.03 0.03 0.03   Lab Results  Component Value Date   WBC 6.7 11/25/2015   HGB 9.5* 11/25/2015   HCT 29.1* 11/25/2015   MCV 78.1* 11/25/2015   PLT 177 11/25/2015    Recent Labs Lab 11/25/15 0923  NA 136  K 4.5  CL 103  CO2 24  BUN 49*  CREATININE 1.59*  CALCIUM 8.6*  GLUCOSE 92   Lab Results  Component Value Date   CHOL 202* 12/23/2014   HDL 28* 12/23/2014   LDLCALC 107* 12/23/2014   TRIG 337* 12/23/2014   No results found for: DDIMER  Radiology/Studies:  Dg Chest Portable 1 View  11/24/2015  CLINICAL DATA:  Atrial fibrillation with rapid ventricular response. EXAM: PORTABLE CHEST 1 VIEW COMPARISON:  10/06/2015 chest radiograph. FINDINGS: Pacer pad overlies the left chest. Surgical clips overlie the right upper lung. Stable cardiomediastinal silhouette with mild cardiomegaly. No pneumothorax. Hyperinflated lungs. Stable mild blunting of both costophrenic angles, cannot exclude trace pleural effusions. Mild pulmonary edema. IMPRESSION: 1. Mild congestive heart failure. 2. Hyperinflated lungs suggesting COPD. 3. Possible trace bilateral pleural effusions. Electronically Signed   By: Ilona Sorrel M.D.   On: 11/24/2015 22:45    EKG: Afib with RVR, 156 bpm, LBBB (old when compared to 08/12/2015)  Weights: Filed Weights   11/24/15 2153  Weight: 190 lb (86.183 kg)     Physical Exam: Blood pressure 114/72, pulse 130, temperature 97.8 F (36.6 C), temperature source Oral, resp. rate 21, height '5\' 4"'$  (1.626 m), weight 190 lb (86.183 kg), SpO2 98 %. Body mass index is 32.6 kg/(m^2). General: Well developed, well nourished, in no acute distress. Head: Normocephalic, atraumatic, sclera non-icteric,  no xanthomas, nares are without discharge.  Neck: Negative for carotid bruits. JVD not elevated. Lungs: Clear bilaterally to auscultation without wheezes, rales, or rhonchi. Breathing is unlabored. Heart: Irregularly irregular with S1 S2. No murmurs, rubs, or gallops appreciated. Abdomen: Soft, non-tender, non-distended with normoactive bowel sounds. No hepatomegaly. No rebound/guarding. No obvious abdominal masses. Msk:  Strength and tone appear normal for age. Extremities: No clubbing or cyanosis. No edema.  Distal pedal pulses are 2+ and equal bilaterally. Neuro: Alert and oriented X 3. No facial asymmetry. No focal deficit. Moves all extremities spontaneously. Psych:  Responds to questions appropriately with a normal affect.    Assessment and Plan:   1. PAF with RVR: -Currently in rate controlled Afib with heart rate in the 70's s/p Cardizem injection of 10 mg, diltiazem gtt, and digoxin 0.25 (patient does have history of digoxin toxicity) -Not on long term, full-dose anticoagulation given her history of anemia with hgb in the mid 9 range at baseline, which is stable at this time -Continue diltiazem gtt overnight, if she remains with rate controlled Afib can transition to PO Cardizem -Not a DCCV candidate as she is not on anticoagulation  -Cautious use of digoxin with her history, may want to avoid if possible  -CHADS2VASc at least 7 (CHF, HTN, age x 2, DM, vascular disease, sex category)  2. Acute on chronic respiratory failure: -Likely multifactorial including the above, COPD exacerbation, and acute on chronic diastolic CHF -Wean oxygen back to 2 L as able -Nebs and steroids per IM  3. Acute on chronic diastolic CHF: -Receive IV Lasix in the ED x 1 -Continue gentle diuresis -Echo pending -  Continue Toprol XL  4. CAD s/p PCI 11/2014: -She develops chest tightness when she goes into Afib with RVR. This is likely in the setting of numbers 1, 2, 3 , and 4 -Outside of this episode of  Afib with RVR she has not had any chest pain -Recent cardiac cath as above -Consider outpatient stress testing -Negative troponin x 3 is reassuring in the setting of #1  5. Anemia: -Chronic -Stable -Per IM    Signed, Christell Faith, PA-C Pager: [phone] 11/25/2015, 4:02 PM

## 2015-11-25 NOTE — ED Notes (Signed)
Admitting MD notified of increasing HR

## 2015-11-25 NOTE — ED Notes (Signed)
Pt remains in afib.  Denies CP and SHOB.  Pt alert and oriented. Blood pressure soft. Will pg MD r/t lasix order. Remains on diltiazem gtt at '15mg'$ /hr. No needs at this time.

## 2015-11-25 NOTE — ED Notes (Signed)
Given lunch tray.

## 2015-11-25 NOTE — ED Notes (Signed)
Offered food. Pt does not want to eat.

## 2015-11-26 ENCOUNTER — Inpatient Hospital Stay (HOSPITAL_COMMUNITY)
Admit: 2015-11-26 | Discharge: 2015-11-26 | Disposition: A | Discharge status: Home / Self Care | Payer: Medicare Other | Attending: Internal Medicine | Admitting: Internal Medicine

## 2015-11-26 DIAGNOSIS — R0600 Dyspnea, unspecified: Secondary | ICD-10-CM

## 2015-11-26 HISTORY — PX: TRANSTHORACIC ECHOCARDIOGRAM: SHX275

## 2015-11-26 LAB — CREATININE, SERUM
Creatinine, Ser: 1.68 mg/dL — ABNORMAL HIGH (ref 0.44–1.00)
GFR calc non Af Amer: 28 mL/min — ABNORMAL LOW (ref >60)
GFR, EST AFRICAN AMERICAN: 32 mL/min — AB (ref >60)

## 2015-11-26 LAB — ECHOCARDIOGRAM COMPLETE
Height: 64 in
Weight: 3040 oz

## 2015-11-26 MED ORDER — AMIODARONE HCL 200 MG PO TABS
400.0000 mg | ORAL_TABLET | Freq: Two times a day (BID) | ORAL | Status: DC
  Administered 2015-11-26: 400 mg via ORAL
  Filled 2015-11-26: qty 2

## 2015-11-26 MED ORDER — AMIODARONE HCL 400 MG PO TABS: ORAL_TABLET | ORAL | Status: DC

## 2015-11-26 MED ORDER — DILTIAZEM HCL ER COATED BEADS 120 MG PO CP24
120.0000 mg | ORAL_CAPSULE | Freq: Every day | ORAL | Status: DC
  Administered 2015-11-26: 120 mg via ORAL
  Filled 2015-11-26 (×2): qty 1

## 2015-11-26 MED ORDER — METOPROLOL SUCCINATE ER 50 MG PO TB24
100.0000 mg | ORAL_TABLET | Freq: Every day | ORAL | Status: DC
  Administered 2015-11-26: 100 mg via ORAL
  Filled 2015-11-26: qty 2

## 2015-11-26 NOTE — Care Management (Signed)
From home.  Independent in all adls.  Denies issues accessing medical care, obtaining medications or with transportation.  Current with her PCP.  Admitted for atrial fib.  Has converted to nsr.  Not a good candidate for anticoagulation due to history of profound anemia.  No d/c needs identified

## 2015-11-26 NOTE — Progress Notes (Signed)
Pt has remained A & O x 4. Lung sounds are clear to auscultation on 2LNC. Pt converted to NSR last night and has remained in sinus rhythm on the monitor. IV Amiodarone infiltration to LUE persist. Pt instructed to continue with cold compresses and to notify the doctor with persistent pain, tenderness, warmth or swelling. Pt to be discharged to home with daughter.

## 2015-11-26 NOTE — Progress Notes (Signed)
Patient: Regina Burke / Admit Date: 11/24/2015 / Date of Encounter: 11/26/2015, 7:24 AM   Subjective: Sleeping this morning Converted to normal sinus rhythm at 11:15 PM last night Diltiazem infusion weaned off, started on amiodarone around dinnertime last night   Review of Systems: Review of Systems  Constitutional: Negative.   Respiratory: Negative.   Cardiovascular: Negative.   Gastrointestinal: Negative.   Musculoskeletal: Negative.   Neurological: Negative.   Psychiatric/Behavioral: Negative.   All other systems reviewed and are negative.   Objective: Telemetry: tele shows NSR, converted at 11:15 pm last night Physical Exam: Blood pressure 129/87, pulse 74, temperature 97.7 F (36.5 C), temperature source Axillary, resp. rate 17, height '5\' 4"'$  (1.626 m), weight 190 lb (86.183 kg), SpO2 98 %. Body mass index is 32.6 kg/(m^2). General: Well developed, well nourished, in no acute distress. Head: Normocephalic, atraumatic, sclera non-icteric, no xanthomas, nares are without discharge. Neck: Negative for carotid bruits. JVP not elevated. Lungs: decreased, Coarse breath sounds,  Breathing is unlabored. Heart: RRR S1 S2 without murmurs, rubs, or gallops.  Abdomen: Soft, non-tender, non-distended with normoactive bowel sounds. No rebound/guarding. Extremities: No clubbing or cyanosis. No edema. Distal pedal pulses are 2+ and equal bilaterally. Neuro: Alert and oriented X 3. Moves all extremities spontaneously. Psych:  Responds to questions appropriately with a normal affect.   Intake/Output Summary (Last 24 hours) at 11/26/15 0724 Last data filed at 11/26/15 0700  Gross per 24 hour  Intake  570.3 ml  Output   1375 ml  Net -804.7 ml    Inpatient Medications:  . ALPRAZolam  0.25 mg Oral QHS  . antiseptic oral rinse  7 mL Mouth Rinse BID  . aspirin EC  81 mg Oral Daily  . atorvastatin  10 mg Oral Daily  . cholecalciferol  800 Units Oral Daily  . clopidogrel  75 mg  Oral Daily  . cyanocobalamin  500 mcg Oral Daily  . enoxaparin (LOVENOX) injection  1 mg/kg Subcutaneous Q12H  . febuxostat  40 mg Oral Daily  . fenofibrate  54 mg Oral Daily  . ferrous sulfate  325 mg Oral Daily  . furosemide  40 mg Intravenous Q12H  . metoprolol tartrate  25 mg Oral BID  . multivitamin-lutein  1 capsule Oral Daily  . sertraline  50 mg Oral Daily   Infusions:  . amiodarone 30 mg/hr (11/26/15 0700)  . diltiazem (CARDIZEM) infusion Stopped (11/26/15 0245)    Labs:  Recent Labs  11/24/15 2222 11/25/15 0923 11/26/15 0612  NA 137 136  --   K 4.3 4.5  --   CL 102 103  --   CO2 26 24  --   GLUCOSE 125* 92  --   BUN 49* 49*  --   CREATININE 1.45* 1.59* 1.68*  CALCIUM 8.5* 8.6*  --    No results for input(s): AST, ALT, ALKPHOS, BILITOT, PROT, ALBUMIN in the last 72 hours.  Recent Labs  11/24/15 2222 11/25/15 0923  WBC 7.5 6.7  HGB 9.7* 9.5*  HCT 28.7* 29.1*  MCV 78.5* 78.1*  PLT 158 177    Recent Labs  11/24/15 2222 11/25/15 0923 11/25/15 1455 11/25/15 2143  TROPONINI <0.03 0.03 0.03 0.03   Invalid input(s): POCBNP No results for input(s): HGBA1C in the last 72 hours.   Weights: Filed Weights   11/24/15 2153  Weight: 190 lb (86.183 kg)     Radiology/Studies:  Dg Chest Portable 1 View  11/24/2015  CLINICAL DATA:  Atrial fibrillation  with rapid ventricular response. EXAM: PORTABLE CHEST 1 VIEW COMPARISON:  10/06/2015 chest radiograph. FINDINGS: Pacer pad overlies the left chest. Surgical clips overlie the right upper lung. Stable cardiomediastinal silhouette with mild cardiomegaly. No pneumothorax. Hyperinflated lungs. Stable mild blunting of both costophrenic angles, cannot exclude trace pleural effusions. Mild pulmonary edema. IMPRESSION: 1. Mild congestive heart failure. 2. Hyperinflated lungs suggesting COPD. 3. Possible trace bilateral pleural effusions. Electronically Signed   By: Ilona Sorrel M.D.   On: 11/24/2015 22:45     Assessment  and Plan  82 y.o. female  history of paroxysmal atrial fibrillation, COPD from 40 years of smoking him a prior hospital admissions for acute on chronic diastolic CHF and COPD exacerbation, presenting with acute onset of left-sided chest pain, nausea, tachycardia, sweating at 7 PM last night, approximately 23 hours ago.  presented to the emergency room in atrial fibrillation with RVR, rate 156 bpm -She was given Cardizem started on diltiazem infusion, digoxin  1) atrial fibrillation, paroxysmal Diltiazem infusion weaned, started on amiodarone infusion last night Converted to normal sinus rhythm at 11:15 PM Was likely in atrial fibrillation for approximately 28 hours before converting Has not been a good candidate for anticoagulation in the past given periods of profound anemia -----Will change amiodarone to 400 mg by mouth twice a day for 5 days then 200 mg twice a day -----As an outpatient was taking large doses of metoprolol and diltiazem extended release, Would restart the slowly through the course of the day, as blood pressure permits. May need to decrease doses for hypotension  --Acute renal failure BUN and creatinine elevated, possibly from mild dehydration Will hold diuretic  --COPD  Currently reports symptoms are stable, uses [facility] (would like a refill) Does not like albuterol, makes her jittery  ---cad, pci History of stent in 2016,  left side chest pain two nights ago likely related to atrial fibrillation with RVR  If tolerating po meds, BP stable on outpt regimin, could potentially go home today  Signed, Esmond Plants, MD, Ph.D. [facility] 11/26/2015, 7:24 AM

## 2015-11-26 NOTE — Care Management Important Message (Signed)
Important Message  Patient Details  Name: Regina Burke MRN: [id number] Date of Birth: Aug 03, 1933   Medicare Important Message Given:  Yes    Katrina Stack, RN 11/26/2015, 3:24 PM

## 2015-11-26 NOTE — Progress Notes (Signed)
IV amiodarone extravasation. Pt c/o pain in left forarm. Dr Darvin Neighbours notified and consulted with Micheal, Pharmacist in regard to medication absorption and effects on tissue integrity. Per Dr Darvin Neighbours -apply cold compresses.

## 2015-11-26 NOTE — Discharge Instructions (Signed)
°  DIET:  °Cardiac diet ° °DISCHARGE CONDITION:  °Stable ° °ACTIVITY:  °Activity as tolerated ° °OXYGEN:  °Home Oxygen: Yes.   °  °Oxygen Delivery: 2 liters/min via Patient connected to nasal cannula oxygen ° °DISCHARGE LOCATION:  °home  ° °If you experience worsening of your admission symptoms, develop shortness of breath, life threatening emergency, suicidal or homicidal thoughts you must seek medical attention immediately by calling 911 or calling your MD immediately  if symptoms less severe. ° °You Must read complete instructions/literature along with all the possible adverse reactions/side effects for all the Medicines you take and that have been prescribed to you. Take any new Medicines after you have completely understood and accpet all the possible adverse reactions/side effects.  ° °Please note ° °You were cared for by a hospitalist during your hospital stay. If you have any questions about your discharge medications or the care you received while you were in the hospital after you are discharged, you can call the unit and asked to speak with the hospitalist on call if the hospitalist that took care of you is not available. Once you are discharged, your primary care physician will handle any further medical issues. Please note that NO REFILLS for any discharge medications will be authorized once you are discharged, as it is imperative that you return to your primary care physician (or establish a relationship with a primary care physician if you do not have one) for your aftercare needs so that they can reassess your need for medications and monitor your lab values. ° ° ° °

## 2015-11-26 NOTE — Progress Notes (Signed)
*  PRELIMINARY RESULTS* Echocardiogram 2D Echocardiogram has been performed.  Regina Burke 11/26/2015, 11:36 AM

## 2015-11-27 ENCOUNTER — Encounter: Payer: Self-pay | Admitting: Emergency Medicine

## 2015-11-27 ENCOUNTER — Emergency Department
Admission: EM | Admit: 2015-11-27 | Discharge: 2015-11-27 | Disposition: A | Discharge status: Home / Self Care | Payer: Medicare Other | Attending: Student | Admitting: Student

## 2015-11-27 ENCOUNTER — Emergency Department: Payer: Medicare Other

## 2015-11-27 DIAGNOSIS — E119 Type 2 diabetes mellitus without complications: Secondary | ICD-10-CM | POA: Insufficient documentation

## 2015-11-27 DIAGNOSIS — Z792 Long term (current) use of antibiotics: Secondary | ICD-10-CM | POA: Insufficient documentation

## 2015-11-27 DIAGNOSIS — Z7982 Long term (current) use of aspirin: Secondary | ICD-10-CM | POA: Diagnosis not present

## 2015-11-27 DIAGNOSIS — Y658 Other specified misadventures during surgical and medical care: Secondary | ICD-10-CM | POA: Diagnosis not present

## 2015-11-27 DIAGNOSIS — Z880 Allergy status to penicillin: Secondary | ICD-10-CM | POA: Insufficient documentation

## 2015-11-27 DIAGNOSIS — R2232 Localized swelling, mass and lump, left upper limb: Secondary | ICD-10-CM | POA: Diagnosis present

## 2015-11-27 DIAGNOSIS — N183 Chronic kidney disease, stage 3 (moderate): Secondary | ICD-10-CM | POA: Insufficient documentation

## 2015-11-27 DIAGNOSIS — I129 Hypertensive chronic kidney disease with stage 1 through stage 4 chronic kidney disease, or unspecified chronic kidney disease: Secondary | ICD-10-CM | POA: Insufficient documentation

## 2015-11-27 DIAGNOSIS — Z7902 Long term (current) use of antithrombotics/antiplatelets: Secondary | ICD-10-CM | POA: Insufficient documentation

## 2015-11-27 DIAGNOSIS — L03114 Cellulitis of left upper limb: Secondary | ICD-10-CM | POA: Insufficient documentation

## 2015-11-27 DIAGNOSIS — T80818A Extravasation of other vesicant agent, initial encounter: Secondary | ICD-10-CM | POA: Insufficient documentation

## 2015-11-27 DIAGNOSIS — Z79899 Other long term (current) drug therapy: Secondary | ICD-10-CM | POA: Insufficient documentation

## 2015-11-27 DIAGNOSIS — Z87891 Personal history of nicotine dependence: Secondary | ICD-10-CM | POA: Diagnosis not present

## 2015-11-27 MED ORDER — CLINDAMYCIN HCL 300 MG PO CAPS: 300.0000 mg | ORAL_CAPSULE | Freq: Four times a day (QID) | ORAL | Status: AC

## 2015-11-27 MED ORDER — HYALURONIDASE HUMAN 150 UNIT/ML IJ SOLN
30.0000 [IU] | Freq: Once | INTRAMUSCULAR | Status: AC
  Administered 2015-11-27: 30 [IU] via SUBCUTANEOUS
  Filled 2015-11-27: qty 0.2

## 2015-11-27 MED ORDER — CLINDAMYCIN HCL 150 MG PO CAPS
300.0000 mg | ORAL_CAPSULE | Freq: Once | ORAL | Status: AC
  Administered 2015-11-27: 300 mg via ORAL
  Filled 2015-11-27: qty 2

## 2015-11-27 NOTE — ED Notes (Signed)
States she was dc'd from hospital yesterday.  Area where IV was is swollen and red.

## 2015-11-27 NOTE — ED Notes (Signed)
150 mg of Hylenex given by Dr. Edd Fabian in the L arm at the elbow. Dr. Edd Fabian at bedside to mark area of redness, pt instructed to return to ED if redness spreads past the mark.

## 2015-11-27 NOTE — Discharge Instructions (Signed)
You were seen  in the emergency department for arm swelling after IV medication escaped your veins and went into your tissues. You also have a mild associated skin infection. Please take the clindamycin as prescribed and continue to apply cold compresses to the swollen area. Please carefully observe the swollen area. Return immediately to the emergency department if the redness is spreading, if you have increased pain, if you have any numbness or weakness in the left arm, if the arm becomes cool to the touch or blue, if there is any drainage of pus, or for any other concerns. Otherwise follow with your primary care doctor as well as the orthopedic doctors as we discussed.

## 2015-11-27 NOTE — ED Provider Notes (Addendum)
[facility] Emergency Department Provider Note  ____________________________________________  Time seen: Approximately 1:13 PM  I have reviewed the triage vital signs and the nursing notes.   HISTORY  Chief Complaint Arm Injury    HPI NICHOLL ONSTOTT is a 82 y.o. female with history of COPD, CHF, hypertension, hyperlipidemia, chronic 2 L home oxygen requirement who presents for evaluation of left arm swelling that began yesterday, gradual onset, constant since onset, worsening, currently moderate. The patient was discharged from [facility] yesterday after being treated for atrial fibrillation with rapid ventricular rate with Cardizem drip, digoxin, metoprolol. She had an IV placed in her left antecubital fossa during the admission. She reports that just before discharge, there was noted to be some swelling associated with that IV site. Per chart review, amiodarone was infusing through the line and there was extravasation. The IV was discontinued and she was discharged with an ice pack and told to watch the area. Since that time she has had increased swelling as well as some new associated erythema. It is mildly painful. No numbness or weakness in the left arm/hand. No fevers or chills. No vomiting or diarrhea. No chest pain and difficulty breathing. No palpitations.   Past Medical History  Diagnosis Date  . COPD (chronic obstructive pulmonary disease) ([place])   . CHF (congestive heart failure) ([place])   . Hypertension   . Rapid palpitations 2014    Seen at [facility], may have been atrial fibrillation  . DOE (dyspnea on exertion)     Started after treatment for lung cancer  . High cholesterol   . Heart murmur   . Myocardial infarction ([place]) 12/22/2014   . Lung cancer ([place]) dx'd 2014    S/P radiation 2015  . Sleep apnea   . Cushing's disease ([place])   . Type II diabetes mellitus ([place])   . Anemia   . GERD (gastroesophageal reflux disease)   . History of hiatal hernia   .  Migraine   . Arthritis   . Gout   . Depression   . Asthma   . Dysrhythmia   . Coronary artery disease   . HOH (hard of hearing)   . Chronic kidney disease (CKD), stage III (moderate)     INCREASED KT  . Anginal pain ([place])   . Cough     CHRONIC AT NIGHT  . Edema     FEET/LEGS  . Wheezing     Patient Active Problem List   Diagnosis Date Noted  . Dyspnea 11/25/2015  . Atrial fibrillation with RVR ([place])   . Diaphoresis   . Renal insufficiency   . Centrilobular emphysema ([place])   . Carcinoma of lung ([place]) 10/05/2015  . Chronic obstructive pulmonary disease ([place]) 07/14/2015  . Status post thoracentesis   . Acute on chronic diastolic CHF (congestive heart failure), NYHA class 1 ([place])   . Renal failure   . S/P thoracentesis   . Atrial fibrillation with rapid ventricular response ([place])   . H/O: lung cancer   . Pleural effusion   . Respiratory failure ([place])   . Coronary artery disease involving native coronary artery of native heart without angina pectoris   . S/P coronary artery stent placement   . Pressure ulcer 06/08/2015  . Atrial fibrillation, rapid ([place]) 06/07/2015  . Acute kidney injury ([place]) 06/07/2015  . Hyperkalemia 06/07/2015  . Chest wall pain 06/04/2015  . Acute respiratory failure ([place]) 05/16/2015  . Dehydration 05/09/2015  . Diabetes ([place]) 05/09/2015  . Closed  fracture nasal bone 05/09/2015  . Paroxysmal atrial fibrillation ([place]) 05/05/2015  . Preoperative clearance 04/15/2015  . CAD (coronary artery disease) 04/14/2015  . Cancer of upper lobe of right lung ([place]) 01/08/2015  . Allergic state 01/08/2015  . Absolute anemia 01/08/2015  . A-fib ([place]) 01/08/2015  . Chronic kidney disease 01/08/2015  . CAFL (chronic airflow limitation) ([place]) 01/08/2015  . Arthritis, degenerative 01/08/2015  . 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 Hypertension   . Rapid palpitations   . Diabetes mellitus, type 2 ([place]) 09/11/2014  . Type 2 diabetes mellitus ([place])  09/11/2014    Past Surgical History  Procedure Laterality Date  . Adrenalectomy Left 1980's    "[place]"  . Abdominal hysterectomy    . Appendectomy    . Cholecystectomy    . Coronary angioplasty with stent placement  12/23/2014  . Tonsillectomy    . Fracture surgery    . Wrist fracture surgery Bilateral ~ 2000  . Breast cyst excision Left   . Tubal ligation    . Left heart catheterization with coronary angiogram N/A 12/23/2014    Procedure: LEFT HEART CATHETERIZATION WITH CORONARY ANGIOGRAM;  Surgeon: Burnell Blanks, MD;  Location: [facility] CATH LAB;  Service: Cardiovascular;  Laterality: N/A;  . Percutaneous coronary stent intervention (pci-s)  12/23/2014    Procedure: PERCUTANEOUS CORONARY STENT INTERVENTION (PCI-S);  Surgeon: Burnell Blanks, MD;  Location: [facility] CATH LAB;  Service: Cardiovascular;;  Promus 2.25x8    Current Outpatient Rx  Name  Route  Sig  Dispense  Refill  . ALPRAZolam (XANAX) 0.25 MG tablet   Oral   Take 1 tablet (0.25 mg total) by mouth 3 (three) times daily as needed for anxiety. Patient taking differently: Take 0.25 mg by mouth at bedtime.    30 tablet   0   . amiodarone (PACERONE) 400 MG tablet      Amiodarone '400mg'$  BID for 5 days and then '400mg'$  once a day   35 tablet   0   . aspirin EC 81 MG tablet   Oral   Take 1 tablet (81 mg total) by mouth daily. Patient taking differently: Take 81 mg by mouth every evening.    90 tablet   3   . atorvastatin (LIPITOR) 10 MG tablet   Oral   Take 10 mg by mouth every evening.          . cholecalciferol (VITAMIN D) 400 UNITS TABS tablet   Oral   Take 800 Units by mouth every evening.          . clindamycin (CLEOCIN) 300 MG capsule   Oral   Take 1 capsule (300 mg total) by mouth 4 (four) times daily.   28 capsule   0   . clopidogrel (PLAVIX) 75 MG tablet   Oral   Take 1 tablet (75 mg total) by mouth daily. Patient taking differently: Take 75 mg by mouth every evening.    30 tablet    11   . cyanocobalamin 500 MCG tablet   Oral   Take 500 mcg by mouth every evening.         . diltiazem (DILACOR XR) 240 MG 24 hr capsule   Oral   Take 1 capsule (240 mg total) by mouth daily. Patient taking differently: Take 300 mg by mouth every evening.    30 capsule   5   . febuxostat (ULORIC) 40 MG tablet   Oral   Take 40 mg by mouth every evening.          Marland Kitchen  fenofibrate (TRICOR) 48 MG tablet   Oral   Take 48 mg by mouth every evening.          . ferrous sulfate 325 (65 FE) MG EC tablet   Oral   Take 325 mg by mouth every evening.          . furosemide (LASIX) 20 MG tablet   Oral   Take 1 tablet (20 mg total) by mouth daily. Patient taking differently: Take 20 mg by mouth every evening.    30 tablet   5   . hydrochlorothiazide (HYDRODIURIL) 12.5 MG tablet   Oral   Take 12.5 mg by mouth every evening.         . metoprolol succinate (TOPROL-XL) 100 MG 24 hr tablet   Oral   Take 1 tablet (100 mg total) by mouth daily. Take with or immediately following a meal. Patient taking differently: Take 100 mg by mouth every evening.    30 tablet   11   . Multiple Vitamins-Minerals (PRESERVISION AREDS 2) CAPS   Oral   Take 1 capsule by mouth every evening.          . nitroGLYCERIN (NITROSTAT) 0.4 MG SL tablet   Sublingual   Place 1 tablet (0.4 mg total) under the tongue every 5 (five) minutes as needed for chest pain.   25 tablet   3   . sertraline (ZOLOFT) 50 MG tablet   Oral   Take 50 mg by mouth every evening.           Allergies Ciprofloxacin; Doxycycline; Penicillins; Sulfa antibiotics; and Morphine and related  Family History  Problem Relation Age of Onset  . Heart disease Mother   . Diabetes Mother   . Osteoarthritis Mother   . Hypertension Mother   . Heart disease Father   . Hypertension Father   . COPD Brother     Social History Social History  Substance Use Topics  . Smoking status: Former Smoker -- 1.00 packs/day for 45 years     Types: Cigarettes    Quit date: 04/25/1998  . Smokeless tobacco: Never Used  . Alcohol Use: No     Comment: 12/23/2014 "might have a couple mixed drinks/year"    Review of Systems Constitutional: No fever/chills Eyes: No visual changes. ENT: No sore throat. Cardiovascular: Denies chest pain. Respiratory: Denies shortness of breath. Gastrointestinal: No abdominal pain.  No nausea, no vomiting.  No diarrhea.  No constipation. Genitourinary: Negative for dysuria. Musculoskeletal: Negative for back pain. Skin: Negative for rash. Neurological: Negative for headaches, focal weakness or numbness.  10-point ROS otherwise negative.  ____________________________________________   PHYSICAL EXAM:  VITAL SIGNS: ED Triage Vitals  Enc Vitals Group     BP --      Pulse Rate 11/27/15 1154 74     Resp 11/27/15 1154 18     Temp 11/27/15 1154 98.5 F (36.9 C)     Temp Source 11/27/15 1154 Oral     SpO2 --      Weight 11/27/15 1154 190 lb (86.183 kg)     Height 11/27/15 1154 '5\' 4"'$  (1.626 m)     Head Cir --      Peak Flow --      Pain Score 11/27/15 1156 5     Pain Loc --      Pain Edu? --      Excl. in [place]? --     Constitutional: Alert and oriented. Well appearing and in no acute  distress. Eyes: Conjunctivae are normal. PERRL. EOMI. Head: Atraumatic. Nose: No congestion/rhinnorhea. Mouth/Throat: Mucous membranes are moist.  Oropharynx non-erythematous. Neck: No stridor. Supple without meningismus. Cardiovascular: Normal rate, regular rhythm. Grossly normal heart sounds.  Good peripheral circulation. Respiratory: Normal respiratory effort.  No retractions. Lungs CTAB. Gastrointestinal: Soft and nontender. No distention. No CVA tenderness. Genitourinary: deferred Musculoskeletal: No lower extremity tenderness nor edema.  No joint effusions. There is a small needle puncture site in the left antecubital fossa where an IV was previously, there is surrounding edema with an area of  associated erythema with mild warmth. 2+ left radial pulse, left radial, median and ulnar nerve are intact. No pain with passive range of motion at the wrist or with movement of the fingers. Neurologic:  Normal speech and language. No gross focal neurologic deficits are appreciated.  Skin:  Skin is warm, dry and intact. No rash noted. Psychiatric: Mood and affect are normal. Speech and behavior are normal.  ____________________________________________   LABS (all labs ordered are listed, but only abnormal results are displayed)  Labs Reviewed - No data to display ____________________________________________  EKG  none ____________________________________________  RADIOLOGY  Veous doppler ultrasound left arm IMPRESSION: Focal thrombosis of branch of basilic vein below the elbow is noted in area of swelling and bruising where previous IV site had been. No other evidence of thrombus is noted. ____________________________________________   PROCEDURES  Procedure(s) performed: None  Critical Care performed: No  ____________________________________________   INITIAL IMPRESSION / ASSESSMENT AND PLAN / ED COURSE  Pertinent labs & imaging results that were available during my care of the patient were reviewed by me and considered in my medical decision making (see chart for details).  OLYVIA GOPAL is a 82 y.o. female with history of COPD, CHF, hypertension, hyperlipidemia, chronic 2 L home oxygen requirement who presents for evaluation of  Worsening left arm swelling that began yesterday when her amiodarone infusion infiltrated/extravasated. On exam, she is generally well-appearing and in no acute distress. Vital signs stable, she is afebrile. There is some swelling as well as mild warmth and erythema associated with the venipuncture site in the left antecubital fossa consistent with extravasation with likely a mild developing cellulitis. She is neurovascular intact in the left  arm. There is no concern for compartment syndrome. We'll obtain venous Doppler ultrasound to rule out DVT, start by mouth clindamycin and discuss with pharmacy regarding treatment for amiodarone extravasation.  ----------------------------------------- 3:48 PM on 11/27/2015 ----------------------------------------- I discussed the case with the pharmacist, Crystal, and per her recommendation I have injected  30 units hyaluronidase x 5 injections surrounding the area of extravasation. Venous Doppler ultrasound negative for evidence of DVT, there is a small thrombosis in the basilac vein below the elbow which is to be expected given recent IV placement. I discussed meticulous immediate return precautions with the patient as well as her family at bedside, as well as need for close follow-up and she is comfortable with the discharge plan. DC home. __________________________________________   FINAL CLINICAL IMPRESSION(S) / ED DIAGNOSES  Final diagnoses:  Extravasation accident, initial encounter  Cellulitis of left upper extremity      Joanne Gavel, MD 11/27/15 1552  Joanne Gavel, MD 11/27/15 1553

## 2015-11-27 NOTE — ED Notes (Signed)
NAD noted at time of D/C. Pt denies questions or concerns. Pt taken to the lobby via wheelchair at this time.  

## 2015-11-27 NOTE — ED Notes (Signed)
Pt taken to US

## 2015-11-27 NOTE — ED Notes (Signed)
Pt presents with large area of redness and swelling to L inner arm. Pt states had IV there yesterday and IV infiltrated, pt is unsure of what medication she was receiving through her IV at the time of infiltration. Pt states last night she was told by hospital employee to put heat on it, no relief with heat, pt placed ice pack on red area and has done so intermittently since last night. Presents to ED with ice pack on the area of redness and swelling.

## 2015-11-29 NOTE — Discharge Summary (Signed)
[facility] NAME: Chany Woolworth    MR#:  [id number]  DATE OF BIRTH:  May 01, 1933  DATE OF ADMISSION:  11/24/2015 ADMITTING PHYSICIAN: Hillary Bow, MD  DATE OF DISCHARGE: 11/26/2015  5:58 PM  PRIMARY CARE PHYSICIAN: Tracie Harrier, MD   ADMISSION DIAGNOSIS:  Shortness of breath [R06.02] Diaphoresis [R61] Renal insufficiency [N28.9] Atrial fibrillation with RVR (HCC) [I48.91] Ischemic chest pain (HCC) [I20.9]  DISCHARGE DIAGNOSIS:  Principal Problem:   Dyspnea Active Problems:   A-fib (HCC)   Atrial fibrillation with RVR (HCC)   Diaphoresis   Renal insufficiency   Centrilobular emphysema (HCC)   SECONDARY DIAGNOSIS:   Past Medical History  Diagnosis Date  . COPD (chronic obstructive pulmonary disease) ([place])   . CHF (congestive heart failure) ([place])   . Hypertension   . Rapid palpitations 2014    Seen at [facility], may have been atrial fibrillation  . DOE (dyspnea on exertion)     Started after treatment for lung cancer  . High cholesterol   . Heart murmur   . Myocardial infarction ([place]) 12/22/2014   . Lung cancer ([place]) dx'd 2014    S/P radiation 2015  . Sleep apnea   . Cushing's disease ([place])   . Type II diabetes mellitus ([place])   . Anemia   . GERD (gastroesophageal reflux disease)   . History of hiatal hernia   . Migraine   . Arthritis   . Gout   . Depression   . Asthma   . Dysrhythmia   . Coronary artery disease   . HOH (hard of hearing)   . Chronic kidney disease (CKD), stage III (moderate)     INCREASED KT  . Anginal pain ([place])   . Cough     CHRONIC AT NIGHT  . Edema     FEET/LEGS  . Wheezing      ADMITTING HISTORY  HISTORY OF PRESENT ILLNESS: Regina Burke is a 82 y.o. female with a known history of Diastolic heart failure, COPD, hypertension, atrial fibrillation, hyperlipidemia, lung cancer presented to the emergency room with increased shortness of breath and chest pain. Both of the  above complaint started around 7:30 PM last night. Patients chest pain was located retrosternally. It was aching in nature 10 out of 10 on a scale of 1-10. Felt short of breath and currently uses home oxygen. Patient presented to the emergency room her heart rate was around 140 bpm. She was having wide-complex tachycardia secondary A. fib and started on IV Cardizem drip and rate was controlled. Her shortness of breath also improved. First set of troponin was negative. No fever or chills. No history of cough. No history of headache dizziness blurry vision. No history of any syncope or seizure.  HOSPITAL COURSE:   * Atrial fibrillation with rapid ventricular rate Patient was treated with IV Cardizem. Also received 1 dose of IV digoxin. She was started on amiodarone by cardiology with which patient's heartrate is improved. He'll be discharged home on amiodarone, Cardizem and metoprolol to follow-up with cardiology.  * Acute on Chronic diastolic congestive heart failure Improving with IV Lasix. No shortness of breath at time of discharge.  * Acute on chronic respiratory failure due to CHF. Continue oxygen. Improved.  * CKD stage III Stable  * Anemia of chronic disease Stable  * DVT prophylaxis. On Lovenox in the hospital.  Patient did have some extravasation around the IV line in left arm. Cold compresses advised.  Stable  for discharge home to follow-up with cardiology as outpatient.  CONSULTS OBTAINED:  Treatment Team:  Minna Merritts, MD  DRUG ALLERGIES:   Allergies  Allergen Reactions  . Ciprofloxacin Shortness Of Breath, Itching and Rash  . Doxycycline Shortness Of Breath, Itching and Rash  . Penicillins Shortness Of Breath, Itching and Rash  . Sulfa Antibiotics Shortness Of Breath, Itching and Rash  . Morphine And Related Itching    DISCHARGE MEDICATIONS:   Discharge Medication List as of 11/26/2015  4:23 PM    START taking these medications   Details  amiodarone  (PACERONE) 400 MG tablet Amiodarone '400mg'$  BID for 5 days and then '400mg'$  once a day, Print      CONTINUE these medications which have NOT CHANGED   Details  ALPRAZolam (XANAX) 0.25 MG tablet Take 1 tablet (0.25 mg total) by mouth 3 (three) times daily as needed for anxiety., Starting 05/24/2015, Until Discontinued, Print    aspirin EC 81 MG tablet Take 1 tablet (81 mg total) by mouth daily., Starting 04/13/2015, Until Discontinued, OTC    atorvastatin (LIPITOR) 10 MG tablet Take 10 mg by mouth every evening. , Until Discontinued, Historical Med    cholecalciferol (VITAMIN D) 400 UNITS TABS tablet Take 800 Units by mouth every evening. , Until Discontinued, Historical Med    clopidogrel (PLAVIX) 75 MG tablet Take 1 tablet (75 mg total) by mouth daily., Starting 12/24/2014, Until Discontinued, Normal    cyanocobalamin 500 MCG tablet Take 500 mcg by mouth every evening., Until Discontinued, Historical Med    diltiazem (DILACOR XR) 240 MG 24 hr capsule Take 1 capsule (240 mg total) by mouth daily., Starting 06/15/2015, Until Discontinued, Normal    febuxostat (ULORIC) 40 MG tablet Take 40 mg by mouth every evening. , Until Discontinued, Historical Med    fenofibrate (TRICOR) 48 MG tablet Take 48 mg by mouth every evening. , Until Discontinued, Historical Med    ferrous sulfate 325 (65 FE) MG EC tablet Take 325 mg by mouth every evening. , Until Discontinued, Historical Med    furosemide (LASIX) 20 MG tablet Take 1 tablet (20 mg total) by mouth daily., Starting 06/15/2015, Until Discontinued, Normal    hydrochlorothiazide (HYDRODIURIL) 12.5 MG tablet Take 12.5 mg by mouth every evening., Until Discontinued, Historical Med    metoprolol succinate (TOPROL-XL) 100 MG 24 hr tablet Take 1 tablet (100 mg total) by mouth daily. Take with or immediately following a meal., Starting 12/24/2014, Until Discontinued, Normal    Multiple Vitamins-Minerals (PRESERVISION AREDS 2) CAPS Take 1 capsule by mouth  every evening. , Until Discontinued, Historical Med    nitroGLYCERIN (NITROSTAT) 0.4 MG SL tablet Place 1 tablet (0.4 mg total) under the tongue every 5 (five) minutes as needed for chest pain., Starting 12/24/2014, Until Discontinued, Normal    sertraline (ZOLOFT) 50 MG tablet Take 50 mg by mouth every evening., Until Discontinued, Historical Med      STOP taking these medications     Cyanocobalamin (VITAMIN B-12) 500 MCG SUBL      docusate sodium (COLACE) 100 MG capsule         Today   VITAL SIGNS:  Blood pressure 125/47, pulse 77, temperature 98 F (36.7 C), temperature source Oral, resp. rate 19, height '5\' 4"'$  (1.626 m), weight 86.183 kg (190 lb), SpO2 96 %.  I/O:  No intake or output data in the 24 hours ending 11/29/15 1408  PHYSICAL EXAMINATION:  Physical Exam  GENERAL:  82 y.o.-year-old patient lying in  the bed with no acute distress.  LUNGS: Normal breath sounds bilaterally, no wheezing, rales,rhonchi or crepitation. No use of accessory muscles of respiration.  CARDIOVASCULAR: S1, S2 normal. No murmurs, rubs, or gallops.  ABDOMEN: Soft, non-tender, non-distended. Bowel sounds present. No organomegaly or mass.  NEUROLOGIC: Moves all 4 extremities. PSYCHIATRIC: The patient is alert and oriented x 3.  SKIN: No obvious rash, lesion, or ulcer.  Mild swelling left arm due to medication extravasation. No redness.  DATA REVIEW:   CBC  Recent Labs Lab 11/25/15 0923  WBC 6.7  HGB 9.5*  HCT 29.1*  PLT 177    Chemistries   Recent Labs Lab 11/25/15 0923 11/26/15 0612  NA 136  --   K 4.5  --   CL 103  --   CO2 24  --   GLUCOSE 92  --   BUN 49*  --   CREATININE 1.59* 1.68*  CALCIUM 8.6*  --     Cardiac Enzymes  Recent Labs Lab 11/25/15 2143  TROPONINI 0.03    Microbiology Results  Results for orders placed or performed during the hospital encounter of 11/24/15  MRSA PCR Screening     Status: None   Collection Time: 11/25/15  6:00 PM  Result Value  Ref Range Status   MRSA by PCR NEGATIVE NEGATIVE Final    Comment:        The GeneXpert MRSA Assay (FDA approved for NASAL specimens only), is one component of a comprehensive MRSA colonization surveillance program. It is not intended to diagnose MRSA infection nor to guide or monitor treatment for MRSA infections.     RADIOLOGY:  US Venous Img Upper Uni Left  11/27/2015  CLINICAL DATA:  Left upper extremity swelling. EXAM: Left UPPER EXTREMITY VENOUS DOPPLER ULTRASOUND TECHNIQUE: Gray-scale sonography with graded compression, as well as color Doppler and duplex ultrasound were performed to evaluate the upper extremity deep venous system from the level of the subclavian vein and including the jugular, axillary, basilic, radial, ulnar and upper cephalic vein. Spectral Doppler was utilized to evaluate flow at rest and with distal augmentation maneuvers. COMPARISON:  None. FINDINGS: Contralateral Subclavian Vein: Respiratory phasicity is normal and symmetric with the symptomatic side. No evidence of thrombus. Normal compressibility. Internal Jugular Vein: No evidence of thrombus. Normal compressibility, respiratory phasicity and response to augmentation. Subclavian Vein: No evidence of thrombus. Normal compressibility, respiratory phasicity and response to augmentation. Axillary Vein: No evidence of thrombus. Normal compressibility, respiratory phasicity and response to augmentation. Cephalic Vein: No evidence of thrombus. Normal compressibility, respiratory phasicity and response to augmentation. Basilic Vein: Normal compressibility, respiratory phasicity and response to augmentation is noted in most of the basilic vein, although occlusive thrombus is noted in a branch of the vein below the elbow in the area of bruising and swelling where previous IV site was present. Brachial Veins: No evidence of thrombus. Normal compressibility, respiratory phasicity and response augmentation. Radial Veins: No  evidence of thrombus. Normal compressibility, respiratory phasicity and response to augmentation. Ulnar Veins: No evidence of thrombus. Normal compressibility, respiratory phasicity and response to augmentation. Venous Reflux:  None visualized. Other Findings:  None visualized. IMPRESSION: Focal thrombosis of branch of basilic vein below the elbow is noted in area of swelling and bruising where previous IV site had been. No other evidence of thrombus is noted. Electronically Signed   By: Marijo Conception, M.D.   On: 11/27/2015 15:32    Follow up with PCP in 1 week.  Management plans discussed  with the patient, family and they are in agreement.  CODE STATUS:  Code Status History    Date Active Date Inactive Code Status Order ID Comments User Context   11/26/2015  4:02 AM 11/26/2015  8:59 PM Full Code [id number]  Silver Huguenin, RN Inpatient   06/14/2015  6:57 PM 06/15/2015  8:54 PM DNR [id number]  Colleen Can, MD Inpatient   06/07/2015 10:20 PM 06/14/2015  6:56 PM Full Code [id number]  Lytle Butte, MD ED   05/15/2015  3:27 PM 05/24/2015  5:04 PM Full Code [id number]  Gladstone Lighter, MD ED   05/08/2015 11:23 AM 05/11/2015  3:13 PM Full Code [id number]  Dustin Flock, MD ED   04/15/2015 12:01 AM 04/15/2015  7:03 PM Full Code [id number]  Lance Coon, MD Inpatient   12/23/2014  1:09 PM 12/24/2014  5:17 PM Full Code [id number]  Burnell Blanks, MD Inpatient   12/23/2014  4:22 AM 12/23/2014  1:09 PM Full Code [id number]  Alwyn Pea, MD ED      TOTAL TIME TAKING CARE OF THIS PATIENT ON DAY OF DISCHARGE: more than 30 minutes.   Hillary Bow R M.D on 11/29/2015 at 2:08 PM  Between 7am to 6pm - Pager - [phone]  After 6pm go to [email or web address] - password [email or web address] [facility]  Office  [phone]  CC: Primary care physician; Tracie Harrier, MD  Note: This dictation was prepared with Dragon dictation along with smaller phrase technology. Any transcriptional errors  that result from this process are unintentional.

## 2015-12-03 ENCOUNTER — Ambulatory Visit (INDEPENDENT_AMBULATORY_CARE_PROVIDER_SITE_OTHER): Payer: Medicare Other | Admitting: Cardiovascular Disease

## 2015-12-03 ENCOUNTER — Encounter: Payer: Self-pay | Admitting: Cardiovascular Disease

## 2015-12-03 VITALS — BP 120/50 | HR 92 | Ht 65.0 in

## 2015-12-03 DIAGNOSIS — I10 Essential (primary) hypertension: Secondary | ICD-10-CM

## 2015-12-03 DIAGNOSIS — I480 Paroxysmal atrial fibrillation: Secondary | ICD-10-CM | POA: Diagnosis not present

## 2015-12-03 DIAGNOSIS — J432 Centrilobular emphysema: Secondary | ICD-10-CM

## 2015-12-03 DIAGNOSIS — I2510 Atherosclerotic heart disease of native coronary artery without angina pectoris: Secondary | ICD-10-CM

## 2015-12-03 DIAGNOSIS — E875 Hyperkalemia: Secondary | ICD-10-CM

## 2015-12-03 DIAGNOSIS — J90 Pleural effusion, not elsewhere classified: Secondary | ICD-10-CM

## 2015-12-03 DIAGNOSIS — E1159 Type 2 diabetes mellitus with other circulatory complications: Secondary | ICD-10-CM

## 2015-12-03 MED ORDER — ATORVASTATIN CALCIUM 20 MG PO TABS: 20.0000 mg | ORAL_TABLET | Freq: Every evening | ORAL | Status: DC

## 2015-12-03 MED ORDER — AMIODARONE HCL 400 MG PO TABS
400.0000 mg | ORAL_TABLET | Freq: Every day | ORAL | Status: DC
Start: 2015-12-03 — End: 2016-01-19

## 2015-12-03 NOTE — Patient Instructions (Addendum)
Please start metoprolol 1/2 pill daily (25 mg once a day)  Please increase the atorvastatin up to 20 mg once a day for cholesterol  For emergency atrial fib or tachycardia: If from stress: take xanax,  Then if you continue to have symptoms, take a 1/2 metoprolol  If you feel it is atrial fibrillation,  Take additional whole amiodarone  Please call us if you have new issues that need to be addressed before your next appt.  Your physician wants you to follow-up in: 6 months.  You will receive a reminder letter in the mail two months in advance. If you don't receive a letter, please call our office to schedule the follow-up appointment.

## 2015-12-03 NOTE — Progress Notes (Signed)
Patient ID: Regina Burke, female    DOB: [birth year], 82 y.o.   MRN: [id number]  HPI Comments: Regina Burke is a 82 y.o. female  hospitalization  April 2016  For chest pain, stent placed to marginal branch of the circumflex,  history of paroxysmal atrial fibrillation not on anticoagulation secondary to severe anemia, COPD, lung cancer, diabetes mellitus, admission to [facility] with acute respiratory distress, pneumonia, bilateral pleural effusions requiring thoracentesis, acute on chronic diastolic heart failure,   hospital admission in August 2016  for electrolyte abnormality, digoxin toxicity, readmitted 06/07/2015 with shortness of breath, found to have atrial fibrillation , potassium of 7, renal failure, anemia who presents for routine follow-up after recent hospitalization for atrial fibrillation with RVR  She was started on amiodarone infusion, converted shortly after to normal sinus rhythm She did have IV infiltration of the left forearm, had to go back to the emergency room and was treated with a special regiment as detailed in the notes. This was reviewed with her in detail today, arm has improved She has had significant stress at home, feels that she's having tachycardia at times Problems with her daughter, no longer living at her own house, Son presents with her today and reports they are all going to court next week  Patient has been taking Xanax sometimes up to twice a day only in the setting of recent stressors Blood pressure was low while in the hospital, diltiazem and beta blocker was initially held, Diltiazem was restarted at lower dose at the time of discharge, currently not taking metoprolol succinate  Legs are weak, not walking very much, presents today in a wheelchair She reports her cataract surgery was delayed secondary to recent hospitalization  Lab work reviewed with her showing potassium 4.7, she is on Lasix and HCTZ, BUN and creatinine elevated 1.5  EKG on today's  visit shows normal sinus rhythm, rate 86 bpm, left bundle branch block   Other past medical history reviewed Hospital records reviewed from admission October 2016 at [facility] recent hospitalization for respiratory distress, etiology Likely multifactorial including profound anemia, atrial fibrillation, severe COPD, pleural effusions . she has not qualified for BiPAP, "did the wrong sleep study (CPAP)." She denies any problems sleeping, doing well on nasal cannula oxygen Previously seen by Dr. Raul Del, has not had any follow-up  In the hospital had significant anemia, blood count down to hemoglobin 6.7  Was in atrial fibrillation but Converted to normal sinus rhythm after thoracentesis (first one) Breathing better after second thoracentesis   She did have Digoxin toxicity, This was not restarted at discharge   In terms of her anemia, she was given 1 unit of blood, hematocrit 21 up to 24 - 25 patient reports she used to have shots, possibly epo  Presented to [facility] on  12/22/2014 with chest pain.   cardiac catheterization  found to have mild 50% disease in the diagonal branch of the LAD, but high-grade 90% hazy stenosis in the marginal branch of the circumflex.  She underwent successful PTCA and insertion of a 2.25x 8 millimeter Promus premier DES stent at the ostium of the OM3 vessell which was postdilated to 2.5 mm.   Allergies  Allergen Reactions  . Ciprofloxacin Shortness Of Breath, Itching and Rash  . Doxycycline Shortness Of Breath, Itching and Rash  . Penicillins Shortness Of Breath, Itching and Rash  . Sulfa Antibiotics Shortness Of Breath, Itching and Rash  . Morphine And Related Itching    Current Outpatient Prescriptions on  File Prior to Visit  Medication Sig Dispense Refill  . ALPRAZolam (XANAX) 0.25 MG tablet Take 1 tablet (0.25 mg total) by mouth 3 (three) times daily as needed for anxiety. 30 tablet 0  . aspirin EC 81 MG tablet Take 1 tablet (81 mg total) by mouth daily. 90  tablet 3  . cholecalciferol (VITAMIN D) 400 UNITS TABS tablet Take 800 Units by mouth every evening.     . clindamycin (CLEOCIN) 300 MG capsule Take 1 capsule (300 mg total) by mouth 4 (four) times daily. 28 capsule 0  . clopidogrel (PLAVIX) 75 MG tablet Take 1 tablet (75 mg total) by mouth daily. 30 tablet 11  . cyanocobalamin 500 MCG tablet Take 500 mcg by mouth every evening.    . diltiazem (DILACOR XR) 240 MG 24 hr capsule Take 1 capsule (240 mg total) by mouth daily. 30 capsule 5  . febuxostat (ULORIC) 40 MG tablet Take 40 mg by mouth every evening.     . fenofibrate (TRICOR) 48 MG tablet Take 48 mg by mouth every evening.     . ferrous sulfate 325 (65 FE) MG EC tablet Take 325 mg by mouth every evening.     . furosemide (LASIX) 20 MG tablet Take 1 tablet (20 mg total) by mouth daily. 30 tablet 5  . hydrochlorothiazide (HYDRODIURIL) 12.5 MG tablet Take 12.5 mg by mouth every evening.    . Multiple Vitamins-Minerals (PRESERVISION AREDS 2) CAPS Take 1 capsule by mouth every evening.     . nitroGLYCERIN (NITROSTAT) 0.4 MG SL tablet Place 1 tablet (0.4 mg total) under the tongue every 5 (five) minutes as needed for chest pain. 25 tablet 3  . sertraline (ZOLOFT) 50 MG tablet Take 50 mg by mouth every evening.     No current facility-administered medications on file prior to visit.    Past Medical History  Diagnosis Date  . COPD (chronic obstructive pulmonary disease) ([place])   . CHF (congestive heart failure) ([place])   . Hypertension   . Rapid palpitations 2014    Seen at [facility], may have been atrial fibrillation  . DOE (dyspnea on exertion)     Started after treatment for lung cancer  . High cholesterol   . Heart murmur   . Myocardial infarction ([place]) 12/22/2014   . Lung cancer ([place]) dx'd 2014    S/P radiation 2015  . Sleep apnea   . Cushing's disease ([place])   . Type II diabetes mellitus ([place])   . Anemia   . GERD (gastroesophageal reflux disease)   . History of hiatal hernia   .  Migraine   . Arthritis   . Gout   . Depression   . Asthma   . Dysrhythmia   . Coronary artery disease   . HOH (hard of hearing)   . Chronic kidney disease (CKD), stage III (moderate)     INCREASED KT  . Anginal pain ([place])   . Cough     CHRONIC AT NIGHT  . Edema     FEET/LEGS  . Wheezing     Past Surgical History  Procedure Laterality Date  . Adrenalectomy Left 1980's    "Cushings"  . Abdominal hysterectomy    . Appendectomy    . Cholecystectomy    . Coronary angioplasty with stent placement  12/23/2014  . Tonsillectomy    . Fracture surgery    . Wrist fracture surgery Bilateral ~ 2000  . Breast cyst excision Left   . Tubal ligation    .  Left heart catheterization with coronary angiogram N/A 12/23/2014    Procedure: LEFT HEART CATHETERIZATION WITH CORONARY ANGIOGRAM;  Surgeon: Burnell Blanks, MD;  Location: [facility] CATH LAB;  Service: Cardiovascular;  Laterality: N/A;  . Percutaneous coronary stent intervention (pci-s)  12/23/2014    Procedure: PERCUTANEOUS CORONARY STENT INTERVENTION (PCI-S);  Surgeon: Burnell Blanks, MD;  Location: [facility] CATH LAB;  Service: Cardiovascular;;  Promus 2.25x8    Social History  reports that she quit smoking about 17 years ago. Her smoking use included Cigarettes. She has a 45 pack-year smoking history. She has never used smokeless tobacco. She reports that she does not drink alcohol or use illicit drugs.  Family History family history includes COPD in her brother; Diabetes in her mother; Heart disease in her father and mother; Hypertension in her father and mother; Osteoarthritis in her mother.   Review of Systems  Respiratory: Negative.   Cardiovascular: Positive for palpitations.       Periods of tachycardia  Gastrointestinal: Negative.   Musculoskeletal: Positive for gait problem.  Neurological: Positive for weakness.  Hematological: Negative.   Psychiatric/Behavioral: Negative.   All other systems reviewed and are  negative.   BP 120/50 mmHg  Pulse 92  Ht '5\' 5"'$  (1.651 m)  Wt   SpO2 91%  Physical Exam  Constitutional: She is oriented to person, place, and time. She appears well-developed and well-nourished.  On nasal cannula oxygen, presents in a wheelchair  HENT:  Head: Normocephalic.  Nose: Nose normal.  Mouth/Throat: Oropharynx is clear and moist.  Eyes: Conjunctivae are normal. Pupils are equal, round, and reactive to light.  Neck: Normal range of motion. Neck supple. No JVD present.  Cardiovascular: Normal rate, regular rhythm, normal heart sounds and intact distal pulses.  Exam reveals no gallop and no friction rub.   No murmur heard. Pulmonary/Chest: Effort normal. No respiratory distress. She has no wheezes. She has no rales. She exhibits no tenderness.  Abdominal: Soft. Bowel sounds are normal. She exhibits no distension. There is no tenderness.  Musculoskeletal: Normal range of motion. She exhibits no edema or tenderness.  Lymphadenopathy:    She has no cervical adenopathy.  Neurological: She is alert and oriented to person, place, and time. Coordination normal.  Skin: Skin is warm and dry. No rash noted. No erythema.  Psychiatric: She has a normal mood and affect. Her behavior is normal. Judgment and thought content normal.

## 2015-12-03 NOTE — Assessment & Plan Note (Signed)
Chronic shortness of breath, worse on exertion On nasal cannula oxygen, 91% on 2 L on today's visit

## 2015-12-03 NOTE — Assessment & Plan Note (Signed)
Pleural effusion has not reaccumulated, perhaps minimal at the left base, breathing much better Etiology in the past likely secondary to acute on chronic diastolic CHF

## 2015-12-03 NOTE — Assessment & Plan Note (Signed)
Currently with no symptoms of angina. No further workup at this time.  Recommended she increase Lipitor up to 20 mg daily

## 2015-12-03 NOTE — Assessment & Plan Note (Signed)
Blood pressure is well controlled on today's visit.  We will add metoprolol as above

## 2015-12-03 NOTE — Assessment & Plan Note (Signed)
We have encouraged continued careful diet management in an effort to lose weight.  

## 2015-12-03 NOTE — Assessment & Plan Note (Signed)
Lab work reviewed with her, Potassium 4.7, stable though she is on high-dose diuretics Appears prerenal, encouraged her to increase her fluid intake Creatinine 1.5 This was discussed with her   Total encounter time more than 40 minutes  Greater than 50% was spent in counseling and coordination of care with the patient

## 2015-12-03 NOTE — Assessment & Plan Note (Signed)
She is concerned that she is having paroxysmal atrial fibrillation brought on this week by recent family stressors Recommended she restart metoprolol succinate though at lower dose, 25 mg daily Blood pressure is borderline low We could increase the dose in follow-up as tolerated Would continue on amiodarone 400 mg daily for now through her stressful. With family issues Once this resolves, would decrease the dose down to 200 mg daily

## 2015-12-06 ENCOUNTER — Ambulatory Visit: Payer: Medicare Other | Admitting: Cardiovascular Disease

## 2015-12-09 ENCOUNTER — Telehealth: Payer: Self-pay | Admitting: Cardiovascular Disease

## 2015-12-09 NOTE — Telephone Encounter (Signed)
Pt calling stating while being in hospital Dr Rockey Situ gave a prescription for Amiodarone  Pt is stating [facility] has been trying to contact us. They have a question about another medication she is taking.  Just wanted to know if we can call them and find out Please advise  Pt c/o swelling: STAT is pt has developed SOB within 24 hours  1. How long have you been experiencing swelling? Last week  2. Where is the swelling located? feet  3.  Are you currently taking a "fluid pill"? Taking them  4.  Are you currently SOB? No   5.  Have you traveled recently? No

## 2015-12-09 NOTE — Telephone Encounter (Signed)
Spoke w/ pharmacist.  Pt had an rx for amiodarone 200 mg sent into local pharmacy and they wanted to clarify that pt had correct instructions were for 400 mg. She will fill rx and send to pt now.

## 2015-12-13 ENCOUNTER — Telehealth: Payer: Self-pay | Admitting: Cardiovascular Disease

## 2015-12-13 MED ORDER — AMIODARONE HCL 200 MG PO TABS: 400.0000 mg | ORAL_TABLET | Freq: Every day | ORAL | Status: DC

## 2015-12-13 NOTE — Telephone Encounter (Signed)
Patient's son [name] is calling regarding amiodarone (PACERONE) 400 MG tablet. [facility] will not refill it due to a drug interaction. They only have two days left please [facility] the [facility] and [facility] mg and she 400 mg so please double the amount. Please call into both [facility] and [facility]) asap.

## 2015-12-13 NOTE — Telephone Encounter (Signed)
Please have Dr. Rockey Situ review.

## 2015-12-13 NOTE — Telephone Encounter (Signed)
Spoke w/ [name].  He reports that there is in interaction b/t amiodarone & metoprolol that is keeping them from filling amiodarone rx. Called [facility], this was clarified and taken care of on 12/10/15. The local [facility] does not carry 400 mg of amiodarone and would like 200 mg tabs sent in for 2 pills daily. He asks for a 1 mo supply to get them thru til rx arrives in the mail. Sent updated rx into pharmacy.

## 2015-12-23 ENCOUNTER — Telehealth: Payer: Self-pay | Admitting: Cardiovascular Disease

## 2015-12-23 NOTE — Telephone Encounter (Signed)
Received cardiac clearance request for pt to proceed w/ cataract surgery on 12/28/15 w/ Dr. George Ina. Per Dr. Rockey Situ, pt is cleared for surgery. Faxed signed clearance form to [facility] @ [phone].

## 2015-12-27 ENCOUNTER — Encounter: Payer: Self-pay | Admitting: *Deleted

## 2015-12-27 NOTE — Pre-Procedure Instructions (Signed)
Cardiac clearance on chart.

## 2015-12-28 ENCOUNTER — Encounter
Admission: RE | Disposition: A | Discharge status: Home / Self Care | Payer: Self-pay | Source: Ambulatory Visit | Attending: Ophthalmology

## 2015-12-28 ENCOUNTER — Ambulatory Visit
Admission: RE | Admit: 2015-12-28 | Discharge: 2015-12-28 | Disposition: A | Discharge status: Home / Self Care | Payer: Medicare Other | Source: Ambulatory Visit | Attending: Ophthalmology | Admitting: Ophthalmology

## 2015-12-28 ENCOUNTER — Ambulatory Visit: Payer: Medicare Other | Admitting: Anesthesiology

## 2015-12-28 ENCOUNTER — Encounter: Payer: Self-pay | Admitting: Anesthesiology

## 2015-12-28 DIAGNOSIS — H2511 Age-related nuclear cataract, right eye: Secondary | ICD-10-CM | POA: Diagnosis not present

## 2015-12-28 DIAGNOSIS — M109 Gout, unspecified: Secondary | ICD-10-CM | POA: Insufficient documentation

## 2015-12-28 DIAGNOSIS — Z881 Allergy status to other antibiotic agents status: Secondary | ICD-10-CM | POA: Insufficient documentation

## 2015-12-28 DIAGNOSIS — Z9889 Other specified postprocedural states: Secondary | ICD-10-CM | POA: Insufficient documentation

## 2015-12-28 DIAGNOSIS — Z9981 Dependence on supplemental oxygen: Secondary | ICD-10-CM | POA: Diagnosis not present

## 2015-12-28 DIAGNOSIS — E119 Type 2 diabetes mellitus without complications: Secondary | ICD-10-CM | POA: Insufficient documentation

## 2015-12-28 DIAGNOSIS — I252 Old myocardial infarction: Secondary | ICD-10-CM | POA: Insufficient documentation

## 2015-12-28 DIAGNOSIS — I2510 Atherosclerotic heart disease of native coronary artery without angina pectoris: Secondary | ICD-10-CM | POA: Insufficient documentation

## 2015-12-28 DIAGNOSIS — F329 Major depressive disorder, single episode, unspecified: Secondary | ICD-10-CM | POA: Diagnosis not present

## 2015-12-28 DIAGNOSIS — Z955 Presence of coronary angioplasty implant and graft: Secondary | ICD-10-CM | POA: Diagnosis not present

## 2015-12-28 DIAGNOSIS — M810 Age-related osteoporosis without current pathological fracture: Secondary | ICD-10-CM | POA: Diagnosis not present

## 2015-12-28 DIAGNOSIS — Z79899 Other long term (current) drug therapy: Secondary | ICD-10-CM | POA: Diagnosis not present

## 2015-12-28 DIAGNOSIS — Z880 Allergy status to penicillin: Secondary | ICD-10-CM | POA: Insufficient documentation

## 2015-12-28 DIAGNOSIS — I10 Essential (primary) hypertension: Secondary | ICD-10-CM | POA: Insufficient documentation

## 2015-12-28 DIAGNOSIS — M1990 Unspecified osteoarthritis, unspecified site: Secondary | ICD-10-CM | POA: Diagnosis not present

## 2015-12-28 DIAGNOSIS — N289 Disorder of kidney and ureter, unspecified: Secondary | ICD-10-CM | POA: Insufficient documentation

## 2015-12-28 DIAGNOSIS — Z85118 Personal history of other malignant neoplasm of bronchus and lung: Secondary | ICD-10-CM | POA: Insufficient documentation

## 2015-12-28 DIAGNOSIS — I4891 Unspecified atrial fibrillation: Secondary | ICD-10-CM | POA: Diagnosis not present

## 2015-12-28 DIAGNOSIS — H269 Unspecified cataract: Secondary | ICD-10-CM | POA: Diagnosis present

## 2015-12-28 DIAGNOSIS — Z90710 Acquired absence of both cervix and uterus: Secondary | ICD-10-CM | POA: Insufficient documentation

## 2015-12-28 DIAGNOSIS — J449 Chronic obstructive pulmonary disease, unspecified: Secondary | ICD-10-CM | POA: Insufficient documentation

## 2015-12-28 HISTORY — DX: Allergy status to unspecified drugs, medicaments and biological substances: Z88.9

## 2015-12-28 HISTORY — PX: CATARACT EXTRACTION W/PHACO: SHX586

## 2015-12-28 HISTORY — DX: Diverticulitis of intestine, part unspecified, without perforation or abscess without bleeding: K57.92

## 2015-12-28 LAB — GLUCOSE, CAPILLARY: Glucose-Capillary: 111 mg/dL — ABNORMAL HIGH (ref 65–99)

## 2015-12-28 SURGERY — PHACOEMULSIFICATION, CATARACT, WITH IOL INSERTION
Anesthesia: Monitor Anesthesia Care | Site: Eye | Laterality: Right | Wound class: Clean

## 2015-12-28 MED ORDER — ARMC OPHTHALMIC DILATING GEL
1.0000 "application " | OPHTHALMIC | Status: AC | PRN
  Administered 2015-12-28 (×2): 1 via OPHTHALMIC

## 2015-12-28 MED ORDER — MOXIFLOXACIN HCL 0.5 % OP SOLN
OPHTHALMIC | Status: AC
  Filled 2015-12-28: qty 3

## 2015-12-28 MED ORDER — EPINEPHRINE HCL 1 MG/ML IJ SOLN
INTRAMUSCULAR | Status: AC
  Filled 2015-12-28: qty 1

## 2015-12-28 MED ORDER — NA CHONDROIT SULF-NA HYALURON 40-17 MG/ML IO SOLN
INTRAOCULAR | Status: AC
  Filled 2015-12-28: qty 1

## 2015-12-28 MED ORDER — POVIDONE-IODINE 5 % OP SOLN
1.0000 "application " | Freq: Once | OPHTHALMIC | Status: DC
  Administered 2015-12-28: 1 via OPHTHALMIC

## 2015-12-28 MED ORDER — MOXIFLOXACIN HCL 0.5 % OP SOLN: 1.0000 [drp] | OPHTHALMIC | Status: DC | PRN

## 2015-12-28 MED ORDER — POVIDONE-IODINE 5 % OP SOLN: 1.0000 "application " | Freq: Once | OPHTHALMIC | Status: DC

## 2015-12-28 MED ORDER — ARMC OPHTHALMIC DILATING GEL: 1.0000 "application " | OPHTHALMIC | Status: DC

## 2015-12-28 MED ORDER — SODIUM CHLORIDE 0.9 % IV SOLN
INTRAVENOUS | Status: DC
  Administered 2015-12-28 (×2): via INTRAVENOUS

## 2015-12-28 MED ORDER — TETRACAINE HCL 0.5 % OP SOLN
1.0000 [drp] | Freq: Once | OPHTHALMIC | Status: AC
  Administered 2015-12-28: 1 [drp] via OPHTHALMIC

## 2015-12-28 MED ORDER — ARMC OPHTHALMIC DILATING GEL
OPHTHALMIC | Status: AC
  Administered 2015-12-28: 1 via OPHTHALMIC
  Filled 2015-12-28: qty 0.25

## 2015-12-28 MED ORDER — CEFUROXIME OPHTHALMIC INJECTION 1 MG/0.1 ML
INJECTION | OPHTHALMIC | Status: AC
  Filled 2015-12-28: qty 0.1

## 2015-12-28 MED ORDER — LIDOCAINE HCL (PF) 4 % IJ SOLN
INTRAMUSCULAR | Status: AC
  Filled 2015-12-28: qty 5

## 2015-12-28 MED ORDER — MOXIFLOXACIN HCL 0.5 % OP SOLN
OPHTHALMIC | Status: DC | PRN
  Administered 2015-12-28: 1 [drp] via OPHTHALMIC

## 2015-12-28 MED ORDER — CARBACHOL 0.01 % IO SOLN
INTRAOCULAR | Status: DC | PRN
  Administered 2015-12-28: 0.5 mL via INTRAOCULAR

## 2015-12-28 MED ORDER — POVIDONE-IODINE 5 % OP SOLN
OPHTHALMIC | Status: AC
  Administered 2015-12-28: 1 via OPHTHALMIC
  Filled 2015-12-28: qty 30

## 2015-12-28 MED ORDER — EPINEPHRINE HCL 1 MG/ML IJ SOLN
INTRAOCULAR | Status: DC | PRN
  Administered 2015-12-28: 08:00:00 via OPHTHALMIC

## 2015-12-28 MED ORDER — FENTANYL CITRATE (PF) 100 MCG/2ML IJ SOLN
INTRAMUSCULAR | Status: DC | PRN
  Administered 2015-12-28 (×2): 25 ug via INTRAVENOUS

## 2015-12-28 MED ORDER — NA CHONDROIT SULF-NA HYALURON 40-17 MG/ML IO SOLN
INTRAOCULAR | Status: DC | PRN
  Administered 2015-12-28: 1 mL via INTRAOCULAR

## 2015-12-28 MED ORDER — BSS IO SOLN
INTRAOCULAR | Status: DC | PRN
  Administered 2015-12-28: 08:00:00 via OPHTHALMIC

## 2015-12-28 MED ORDER — TETRACAINE HCL 0.5 % OP SOLN: 1.0000 [drp] | Freq: Once | OPHTHALMIC | Status: DC

## 2015-12-28 MED ORDER — TETRACAINE HCL 0.5 % OP SOLN
OPHTHALMIC | Status: AC
  Administered 2015-12-28: 1 [drp] via OPHTHALMIC
  Filled 2015-12-28: qty 2

## 2015-12-28 SURGICAL SUPPLY — 22 items
CANNULA ANT/CHMB 27GA (MISCELLANEOUS) ×3 IMPLANT
CUP MEDICINE 2OZ PLAST GRAD ST (MISCELLANEOUS) ×3 IMPLANT
GLOVE BIO SURGEON STRL SZ8 (GLOVE) ×3 IMPLANT
GLOVE BIOGEL M 6.5 STRL (GLOVE) ×3 IMPLANT
GLOVE SURG LX 8.0 MICRO (GLOVE) ×2
GLOVE SURG LX STRL 8.0 MICRO (GLOVE) ×1 IMPLANT
GOWN STRL REUS W/ TWL LRG LVL3 (GOWN DISPOSABLE) ×2 IMPLANT
GOWN STRL REUS W/TWL LRG LVL3 (GOWN DISPOSABLE) ×4
LENS IOL TECNIS 21.5 (Intraocular Lens) ×3 IMPLANT
LENS IOL TECNIS MONO 1P 21.5 (Intraocular Lens) ×1 IMPLANT
PACK CATARACT (MISCELLANEOUS) ×3 IMPLANT
PACK CATARACT BRASINGTON LX (MISCELLANEOUS) ×3 IMPLANT
PACK EYE AFTER SURG (MISCELLANEOUS) ×3 IMPLANT
SOL BSS BAG (MISCELLANEOUS) ×3
SOL PREP PVP 2OZ (MISCELLANEOUS) ×3
SOLUTION BSS BAG (MISCELLANEOUS) ×1 IMPLANT
SOLUTION PREP PVP 2OZ (MISCELLANEOUS) ×1 IMPLANT
SYR 3ML LL SCALE MARK (SYRINGE) ×3 IMPLANT
SYR 5ML LL (SYRINGE) ×3 IMPLANT
SYR TB 1ML 27GX1/2 LL (SYRINGE) ×3 IMPLANT
WATER STERILE IRR 1000ML POUR (IV SOLUTION) ×3 IMPLANT
WIPE NON LINTING 3.25X3.25 (MISCELLANEOUS) ×3 IMPLANT

## 2015-12-28 NOTE — Anesthesia Postprocedure Evaluation (Signed)
Anesthesia Post Note  Patient: Regina Burke  Procedure(s) Performed: Procedure(s) (LRB): CATARACT EXTRACTION PHACO AND INTRAOCULAR LENS PLACEMENT (IOC) (Right)  Patient location during evaluation: PACU Anesthesia Type: MAC Level of consciousness: awake and alert Pain management: pain level controlled Vital Signs Assessment: post-procedure vital signs reviewed and stable Respiratory status: spontaneous breathing, nonlabored ventilation, respiratory function stable and patient connected to nasal cannula oxygen Cardiovascular status: stable and blood pressure returned to baseline Anesthetic complications: no    Last Vitals:  Filed Vitals:   12/28/15 0658 12/28/15 0841  BP: 144/91 140/43  Pulse: 66 59  Temp: 36.6 C 36.8 C  Resp: 14 15    Last Pain: There were no vitals filed for this visit.               Precious Haws Sherial Ebrahim

## 2015-12-28 NOTE — H&P (Signed)
  All labs reviewed. Abnormal studies sent to patients PCP when indicated.  Previous H&P reviewed, patient examined, there are NO CHANGES.  Regina Burke LOUIS5/2/20178:14 AM

## 2015-12-28 NOTE — Op Note (Signed)
PREOPERATIVE DIAGNOSIS:  Nuclear sclerotic cataract of the right eye.   POSTOPERATIVE DIAGNOSIS:  Nuclear Sclerotic Cataract Right Eye   OPERATIVE PROCEDURE: Procedure(s): CATARACT EXTRACTION PHACO AND INTRAOCULAR LENS PLACEMENT (IOC)   SURGEON:  Birder Robson, MD.   ANESTHESIA:  Anesthesiologist: Andria Frames, MD CRNA: Marsh Dolly, CRNA  1.      Managed anesthesia care. 2.      Topical tetracaine drops followed by 2% Xylocaine jelly applied in the preoperative holding area.       3.   0.2 ml of epi-Shugarcaine was  placed in the anterior chamber following the paracentesis.    COMPLICATIONS:  None.   TECHNIQUE:   Stop and chop   DESCRIPTION OF PROCEDURE:  The patient was examined and consented in the preoperative holding area where the aforementioned topical anesthesia was applied to the right eye and then brought back to the Operating Room where the right eye was prepped and draped in the usual sterile ophthalmic fashion and a lid speculum was placed. A paracentesis was created with the side port blade and the anterior chamber was filled with viscoelastic. A near clear corneal incision was performed with the steel keratome. A continuous curvilinear capsulorrhexis was performed with a cystotome followed by the capsulorrhexis forceps. Hydrodissection and hydrodelineation were carried out with BSS on a blunt cannula. The lens was removed in a stop and chop  technique and the remaining cortical material was removed with the irrigation-aspiration handpiece. The capsular bag was inflated with viscoelastic and the Technis [id number]  lens was placed in the capsular bag without complication. The remaining viscoelastic was removed from the eye with the irrigation-aspiration handpiece. The wounds were hydrated. The anterior chamber was flushed with Miostat and the eye was inflated to physiologic pressure. 0.2 mL of Vigamox diluted three/one with BSS was placed in the anterior chamber. The wounds  were found to be water tight. The eye was dressed with Vigamox. The patient was given protective glasses to wear throughout the day and a shield with which to sleep tonight. The patient was also given drops with which to begin a drop regimen today and will follow-up with me in one day.  Implant Name Type Inv. Item Serial No. Manufacturer Lot No. LRB No. Used  LENS IOL TECNIS 21.5 - [id number] Intraocular Lens LENS IOL TECNIS 21.5   AMO   Right 1   Procedure(s) with comments: CATARACT EXTRACTION PHACO AND INTRAOCULAR LENS PLACEMENT (IOC) (Right) - [place] 48.4 AP% 19.0 CDE 9.17 Fluid Pack Lot # [id number]  Electronically signed: Shulamis Wenberg LOUIS 12/28/2015 8:40 AM

## 2015-12-28 NOTE — Discharge Instructions (Signed)
FOLLOW EYE DROP INSTRUCTION SHEET AS REVIEWED.  Eye Surgery Discharge Instructions  Expect mild scratchy sensation or mild soreness. DO NOT RUB YOUR EYE!  The day of surgery:  Minimal physical activity, but bed rest is not required  No reading, computer work, or close hand work  No bending, lifting, or straining.  May watch TV  For 24 hours:  No driving, legal decisions, or alcoholic beverages  Safety precautions  Eat anything you prefer: It is better to start with liquids, then soup then solid foods.  _____ Eye patch should be worn until postoperative exam tomorrow.  ____ Solar shield eyeglasses should be worn for comfort in the sunlight/patch while sleeping  Resume all regular medications including aspirin or Coumadin if these were discontinued prior to surgery. You may shower, bathe, shave, or wash your hair. Tylenol may be taken for mild discomfort.  Call your doctor if you experience significant pain, nausea, or vomiting, fever > 101 or other signs of infection. [phone] or [phone] Specific instructions:  Follow-up Information    Follow up with Tim Lair, MD.   Specialty:  Ophthalmology   Why:  Wed. Dec 29, 2015 @ 9:55 am   Contact information:   [address] [place] [address] [phone]

## 2015-12-28 NOTE — Transfer of Care (Signed)
Immediate Anesthesia Transfer of Care Note  Patient: Regina Burke  Procedure(s) Performed: Procedure(s) with comments: CATARACT EXTRACTION PHACO AND INTRAOCULAR LENS PLACEMENT (IOC) (Right) - [place] 48.4 AP% 19.0 CDE 9.17 Fluid Pack Lot # [id number]  Patient Location: PACU  Anesthesia Type:MAC  Level of Consciousness: awake and alert   Airway & Oxygen Therapy: Patient Spontanous Breathing and Patient connected to nasal cannula oxygen  Post-op Assessment: Report given to RN and Post -op Vital signs reviewed and stable  Post vital signs: Reviewed and stable  Last Vitals:  Filed Vitals:   12/27/15 0847 12/28/15 0658  BP: 132/58 144/91  Pulse: 62 66  Temp:  36.6 C  Resp:  14    Last Pain: There were no vitals filed for this visit.       Complications: No apparent anesthesia complications

## 2015-12-28 NOTE — Anesthesia Preprocedure Evaluation (Signed)
Anesthesia Evaluation  Patient identified by MRN, date of birth, ID band Patient awake    Reviewed: Allergy & Precautions, H&P , NPO status , Patient's Chart, lab work & pertinent test results  History of Anesthesia Complications Negative for: history of anesthetic complications  Airway Mallampati: III  TM Distance: >3 FB Neck ROM: limited    Dental  (+) Poor Dentition   Pulmonary shortness of breath and with exertion, asthma , sleep apnea , pneumonia, resolved, COPD,  COPD inhaler and oxygen dependent, former smoker,    Pulmonary exam normal breath sounds clear to auscultation       Cardiovascular Exercise Tolerance: Poor hypertension, (-) angina+ CAD, + Past MI, + Cardiac Stents, +CHF and + DOE  Normal cardiovascular exam+ dysrhythmias Atrial Fibrillation + Valvular Problems/Murmurs  Rhythm:regular Rate:Normal     Neuro/Psych  Headaches, PSYCHIATRIC DISORDERS Depression    GI/Hepatic Neg liver ROS, hiatal hernia, GERD  Controlled,  Endo/Other  diabetes, Type 2  Renal/GU CRFRenal disease  negative genitourinary   Musculoskeletal  (+) Arthritis ,   Abdominal   Peds  Hematology negative hematology ROS (+)   Anesthesia Other Findings Past Medical History:   COPD (chronic obstructive pulmonary disease) (*              CHF (congestive heart failure) (HCC)                         Hypertension                                                 Rapid palpitations                              2014           Comment:Seen at [facility], may have been atrial fibrillation   DOE (dyspnea on exertion)                                      Comment:Started after treatment for lung cancer   High cholesterol                                             Heart murmur                                                 Myocardial infarction ([place])                     12/22/2014    Sleep apnea                                                 Cushing's disease ([place])  Type II diabetes mellitus (HCC)                              Anemia                                                       GERD (gastroesophageal reflux disease)                       History of hiatal hernia                                     Migraine                                                     Arthritis                                                    Gout                                                         Depression                                                   Asthma                                                       Dysrhythmia                                                  Coronary artery disease                                      HOH (hard of hearing)                                        Chronic kidney disease (CKD), stage III ([place]er*                Comment:INCREASED KT   Anginal pain ([place])  Cough                                                          Comment:CHRONIC AT NIGHT   Edema                                                          Comment:FEET/LEGS   Wheezing                                                     Pneumonia                                                    Lung cancer ([place])                               dx'd 2014      Comment:S/P radiation 2015   Diverticulitis                                               Multiple allergies                                           Atrial fibrillation ([place])                                   Past Surgical History:   ADRENALECTOMY                                   Left 1980's         Comment:"[facility]"   ABDOMINAL HYSTERECTOMY                                        APPENDECTOMY                                                  CHOLECYSTECTOMY                                               CORONARY ANGIOPLASTY WITH STENT PLACEMENT  12/23/2014    TONSILLECTOMY                                                  FRACTURE SURGERY                                              WRIST FRACTURE SURGERY                          Bilateral ~ 2000       BREAST CYST EXCISION                            Left              TUBAL LIGATION                                                LEFT HEART CATHETERIZATION WITH CORONARY ANGIO* N/A 12/23/2014      Comment:Procedure: LEFT HEART CATHETERIZATION WITH               CORONARY ANGIOGRAM;  Surgeon: Burnell Blanks, MD;  Location: [facility] CATH LAB;  Service:              Cardiovascular;  Laterality: N/A;   PERCUTANEOUS CORONARY STENT INTERVENTION (PCI-*  12/23/2014      Comment:Procedure: PERCUTANEOUS CORONARY STENT               INTERVENTION (PCI-S);  Surgeon: Burnell Blanks, MD;  Location: [facility] CATH LAB;  Service:              Cardiovascular;;  Promus 2.25x8  BMI    Body Mass Index   29.16 kg/m 2    Patient has cardiac clearance for this procedure.    Reproductive/Obstetrics negative OB ROS                             Anesthesia Physical Anesthesia Plan  ASA: IV  Anesthesia Plan: MAC   Post-op Pain Management:    Induction:   Airway Management Planned:   Additional Equipment:   Intra-op Plan:   Post-operative Plan:   Informed Consent: I have reviewed the patients History and Physical, chart, labs and discussed the procedure including the risks, benefits and alternatives for the proposed anesthesia with the patient or authorized representative who has indicated his/her understanding and acceptance.   Dental Advisory Given  Plan Discussed with: Anesthesiologist, CRNA and Surgeon  Anesthesia Plan Comments:         Anesthesia Quick Evaluation

## 2016-01-11 ENCOUNTER — Ambulatory Visit: Payer: Medicare Other | Admitting: Oncology

## 2016-01-11 ENCOUNTER — Other Ambulatory Visit: Payer: Medicare Other

## 2016-01-17 ENCOUNTER — Encounter: Payer: Self-pay | Admitting: Ophthalmology

## 2016-01-18 ENCOUNTER — Inpatient Hospital Stay: Payer: Medicare Other

## 2016-01-18 ENCOUNTER — Inpatient Hospital Stay: Payer: Medicare Other | Admitting: Oncology

## 2016-01-19 ENCOUNTER — Other Ambulatory Visit: Payer: Self-pay

## 2016-01-19 ENCOUNTER — Inpatient Hospital Stay
Admission: EM | Admit: 2016-01-19 | Discharge: 2016-01-27 | DRG: 190 | Disposition: A | Discharge status: Home Health | Payer: Medicare Other | Attending: Internal Medicine | Admitting: Internal Medicine

## 2016-01-19 ENCOUNTER — Inpatient Hospital Stay: Payer: Medicare Other | Attending: Family Medicine

## 2016-01-19 ENCOUNTER — Emergency Department: Payer: Medicare Other

## 2016-01-19 ENCOUNTER — Encounter: Payer: Self-pay | Admitting: Emergency Medicine

## 2016-01-19 ENCOUNTER — Inpatient Hospital Stay: Payer: Medicare Other | Admitting: Family Medicine

## 2016-01-19 VITALS — BP 140/63 | HR 83 | Temp 97.5°F | Resp 21 | Wt 183.8 lb

## 2016-01-19 DIAGNOSIS — N289 Disorder of kidney and ureter, unspecified: Secondary | ICD-10-CM

## 2016-01-19 DIAGNOSIS — D500 Iron deficiency anemia secondary to blood loss (chronic): Secondary | ICD-10-CM | POA: Diagnosis present

## 2016-01-19 DIAGNOSIS — Z7982 Long term (current) use of aspirin: Secondary | ICD-10-CM | POA: Diagnosis not present

## 2016-01-19 DIAGNOSIS — K219 Gastro-esophageal reflux disease without esophagitis: Secondary | ICD-10-CM | POA: Diagnosis present

## 2016-01-19 DIAGNOSIS — J45909 Unspecified asthma, uncomplicated: Secondary | ICD-10-CM | POA: Diagnosis present

## 2016-01-19 DIAGNOSIS — E860 Dehydration: Secondary | ICD-10-CM | POA: Diagnosis present

## 2016-01-19 DIAGNOSIS — Z85118 Personal history of other malignant neoplasm of bronchus and lung: Secondary | ICD-10-CM | POA: Insufficient documentation

## 2016-01-19 DIAGNOSIS — Z7901 Long term (current) use of anticoagulants: Secondary | ICD-10-CM

## 2016-01-19 DIAGNOSIS — D631 Anemia in chronic kidney disease: Secondary | ICD-10-CM | POA: Diagnosis present

## 2016-01-19 DIAGNOSIS — Z923 Personal history of irradiation: Secondary | ICD-10-CM

## 2016-01-19 DIAGNOSIS — N189 Chronic kidney disease, unspecified: Secondary | ICD-10-CM | POA: Diagnosis not present

## 2016-01-19 DIAGNOSIS — R0600 Dyspnea, unspecified: Secondary | ICD-10-CM | POA: Diagnosis not present

## 2016-01-19 DIAGNOSIS — E1122 Type 2 diabetes mellitus with diabetic chronic kidney disease: Secondary | ICD-10-CM | POA: Diagnosis present

## 2016-01-19 DIAGNOSIS — Z79899 Other long term (current) drug therapy: Secondary | ICD-10-CM

## 2016-01-19 DIAGNOSIS — Z8249 Family history of ischemic heart disease and other diseases of the circulatory system: Secondary | ICD-10-CM | POA: Diagnosis not present

## 2016-01-19 DIAGNOSIS — Z885 Allergy status to narcotic agent status: Secondary | ICD-10-CM

## 2016-01-19 DIAGNOSIS — E785 Hyperlipidemia, unspecified: Secondary | ICD-10-CM | POA: Diagnosis present

## 2016-01-19 DIAGNOSIS — I130 Hypertensive heart and chronic kidney disease with heart failure and stage 1 through stage 4 chronic kidney disease, or unspecified chronic kidney disease: Secondary | ICD-10-CM | POA: Diagnosis present

## 2016-01-19 DIAGNOSIS — H9190 Unspecified hearing loss, unspecified ear: Secondary | ICD-10-CM | POA: Diagnosis present

## 2016-01-19 DIAGNOSIS — Z825 Family history of asthma and other chronic lower respiratory diseases: Secondary | ICD-10-CM

## 2016-01-19 DIAGNOSIS — G4733 Obstructive sleep apnea (adult) (pediatric): Secondary | ICD-10-CM | POA: Diagnosis present

## 2016-01-19 DIAGNOSIS — J9621 Acute and chronic respiratory failure with hypoxia: Secondary | ICD-10-CM | POA: Diagnosis not present

## 2016-01-19 DIAGNOSIS — J441 Chronic obstructive pulmonary disease with (acute) exacerbation: Principal | ICD-10-CM | POA: Diagnosis present

## 2016-01-19 DIAGNOSIS — E875 Hyperkalemia: Secondary | ICD-10-CM | POA: Diagnosis present

## 2016-01-19 DIAGNOSIS — Z87891 Personal history of nicotine dependence: Secondary | ICD-10-CM

## 2016-01-19 DIAGNOSIS — I447 Left bundle-branch block, unspecified: Secondary | ICD-10-CM | POA: Diagnosis present

## 2016-01-19 DIAGNOSIS — J9690 Respiratory failure, unspecified, unspecified whether with hypoxia or hypercapnia: Secondary | ICD-10-CM

## 2016-01-19 DIAGNOSIS — N179 Acute kidney failure, unspecified: Secondary | ICD-10-CM | POA: Diagnosis present

## 2016-01-19 DIAGNOSIS — I5033 Acute on chronic diastolic (congestive) heart failure: Secondary | ICD-10-CM | POA: Insufficient documentation

## 2016-01-19 DIAGNOSIS — Z833 Family history of diabetes mellitus: Secondary | ICD-10-CM | POA: Diagnosis not present

## 2016-01-19 DIAGNOSIS — R0602 Shortness of breath: Secondary | ICD-10-CM | POA: Insufficient documentation

## 2016-01-19 DIAGNOSIS — Z882 Allergy status to sulfonamides status: Secondary | ICD-10-CM

## 2016-01-19 DIAGNOSIS — I10 Essential (primary) hypertension: Secondary | ICD-10-CM | POA: Diagnosis not present

## 2016-01-19 DIAGNOSIS — J9620 Acute and chronic respiratory failure, unspecified whether with hypoxia or hypercapnia: Secondary | ICD-10-CM | POA: Diagnosis not present

## 2016-01-19 DIAGNOSIS — M810 Age-related osteoporosis without current pathological fracture: Secondary | ICD-10-CM | POA: Diagnosis present

## 2016-01-19 DIAGNOSIS — Z9981 Dependence on supplemental oxygen: Secondary | ICD-10-CM | POA: Diagnosis not present

## 2016-01-19 DIAGNOSIS — C3411 Malignant neoplasm of upper lobe, right bronchus or lung: Secondary | ICD-10-CM

## 2016-01-19 DIAGNOSIS — I252 Old myocardial infarction: Secondary | ICD-10-CM

## 2016-01-19 DIAGNOSIS — E7800 Pure hypercholesterolemia, unspecified: Secondary | ICD-10-CM | POA: Diagnosis present

## 2016-01-19 DIAGNOSIS — I480 Paroxysmal atrial fibrillation: Secondary | ICD-10-CM | POA: Insufficient documentation

## 2016-01-19 DIAGNOSIS — I2510 Atherosclerotic heart disease of native coronary artery without angina pectoris: Secondary | ICD-10-CM | POA: Diagnosis present

## 2016-01-19 DIAGNOSIS — E872 Acidosis: Secondary | ICD-10-CM | POA: Diagnosis present

## 2016-01-19 DIAGNOSIS — T501X5A Adverse effect of loop [high-ceiling] diuretics, initial encounter: Secondary | ICD-10-CM | POA: Diagnosis present

## 2016-01-19 DIAGNOSIS — K760 Fatty (change of) liver, not elsewhere classified: Secondary | ICD-10-CM | POA: Diagnosis present

## 2016-01-19 DIAGNOSIS — Z955 Presence of coronary angioplasty implant and graft: Secondary | ICD-10-CM

## 2016-01-19 DIAGNOSIS — J209 Acute bronchitis, unspecified: Secondary | ICD-10-CM | POA: Diagnosis not present

## 2016-01-19 DIAGNOSIS — Z880 Allergy status to penicillin: Secondary | ICD-10-CM | POA: Diagnosis not present

## 2016-01-19 DIAGNOSIS — N183 Chronic kidney disease, stage 3 (moderate): Secondary | ICD-10-CM | POA: Diagnosis present

## 2016-01-19 DIAGNOSIS — Z95828 Presence of other vascular implants and grafts: Secondary | ICD-10-CM

## 2016-01-19 DIAGNOSIS — J069 Acute upper respiratory infection, unspecified: Secondary | ICD-10-CM | POA: Diagnosis present

## 2016-01-19 DIAGNOSIS — F411 Generalized anxiety disorder: Secondary | ICD-10-CM | POA: Diagnosis present

## 2016-01-19 HISTORY — DX: Hypertensive heart disease without heart failure: I11.9

## 2016-01-19 HISTORY — DX: Personal history of pneumonia (recurrent): Z87.01

## 2016-01-19 HISTORY — DX: Hyperlipidemia, unspecified: E78.5

## 2016-01-19 HISTORY — DX: Chronic diastolic (congestive) heart failure: I50.32

## 2016-01-19 LAB — COMPREHENSIVE METABOLIC PANEL
ALBUMIN: 4.3 g/dL (ref 3.5–5.0)
ALT: 48 U/L (ref 14–54)
ALT: 46 U/L (ref 14–54)
ANION GAP: 9 (ref 5–15)
AST: 43 U/L — ABNORMAL HIGH (ref 15–41)
AST: 41 U/L (ref 15–41)
Albumin: 4.6 g/dL (ref 3.5–5.0)
Alkaline Phosphatase: 48 U/L (ref 38–126)
Alkaline Phosphatase: 41 U/L (ref 38–126)
Anion gap: 10 (ref 5–15)
BILIRUBIN TOTAL: 0.7 mg/dL (ref 0.3–1.2)
BILIRUBIN TOTAL: 0.5 mg/dL (ref 0.3–1.2)
BUN: 98 mg/dL — AB (ref 6–20)
BUN: 91 mg/dL — AB (ref 6–20)
CHLORIDE: 96 mmol/L — AB (ref 101–111)
CO2: 31 mmol/L (ref 22–32)
CO2: 30 mmol/L (ref 22–32)
Calcium: 9.4 mg/dL (ref 8.9–10.3)
Calcium: 9.2 mg/dL (ref 8.9–10.3)
Chloride: 96 mmol/L — ABNORMAL LOW (ref 101–111)
Creatinine, Ser: 2.8 mg/dL — ABNORMAL HIGH (ref 0.44–1.00)
Creatinine, Ser: 2.65 mg/dL — ABNORMAL HIGH (ref 0.44–1.00)
GFR calc Af Amer: 18 mL/min — ABNORMAL LOW (ref >60)
GFR calc non Af Amer: 16 mL/min — ABNORMAL LOW (ref >60)
GFR, EST AFRICAN AMERICAN: 17 mL/min — AB (ref >60)
GFR, EST NON AFRICAN AMERICAN: 15 mL/min — AB (ref >60)
GLUCOSE: 92 mg/dL (ref 65–99)
Glucose, Bld: 107 mg/dL — ABNORMAL HIGH (ref 65–99)
POTASSIUM: 6.3 mmol/L — AB (ref 3.5–5.1)
POTASSIUM: 6 mmol/L — AB (ref 3.5–5.1)
Sodium: 136 mmol/L (ref 135–145)
Sodium: 136 mmol/L (ref 135–145)
TOTAL PROTEIN: 7.9 g/dL (ref 6.5–8.1)
TOTAL PROTEIN: 7.3 g/dL (ref 6.5–8.1)

## 2016-01-19 LAB — CBC WITH DIFFERENTIAL/PLATELET
BASOS ABS: 0 10*3/uL (ref 0–0.1)
BASOS PCT: 0 %
Basophils Absolute: 0 10*3/uL (ref 0–0.1)
Basophils Relative: 1 %
Eosinophils Absolute: 0.2 10*3/uL (ref 0–0.7)
Eosinophils Absolute: 0.2 10*3/uL (ref 0–0.7)
Eosinophils Relative: 3 %
Eosinophils Relative: 3 %
HEMATOCRIT: 31.3 % — AB (ref 35.0–47.0)
HEMATOCRIT: 29.6 % — AB (ref 35.0–47.0)
Hemoglobin: 10.4 g/dL — ABNORMAL LOW (ref 12.0–16.0)
Hemoglobin: 9.8 g/dL — ABNORMAL LOW (ref 12.0–16.0)
LYMPHS PCT: 13 %
Lymphocytes Relative: 13 %
Lymphs Abs: 0.8 10*3/uL — ABNORMAL LOW (ref 1.0–3.6)
Lymphs Abs: 0.7 10*3/uL — ABNORMAL LOW (ref 1.0–3.6)
MCH: 25.7 pg — AB (ref 26.0–34.0)
MCH: 25.5 pg — ABNORMAL LOW (ref 26.0–34.0)
MCHC: 33 g/dL (ref 32.0–36.0)
MCHC: 33.1 g/dL (ref 32.0–36.0)
MCV: 77.7 fL — AB (ref 80.0–100.0)
MCV: 77.2 fL — ABNORMAL LOW (ref 80.0–100.0)
MONO ABS: 0.3 10*3/uL (ref 0.2–0.9)
MONO ABS: 0.3 10*3/uL (ref 0.2–0.9)
MONOS PCT: 5 %
Monocytes Relative: 6 %
NEUTROS ABS: 4.8 10*3/uL (ref 1.4–6.5)
NEUTROS ABS: 4.2 10*3/uL (ref 1.4–6.5)
NEUTROS PCT: 78 %
Neutrophils Relative %: 78 %
Platelets: 239 10*3/uL (ref 150–440)
Platelets: 218 10*3/uL (ref 150–440)
RBC: 4.03 MIL/uL (ref 3.80–5.20)
RBC: 3.83 MIL/uL (ref 3.80–5.20)
RDW: 16.1 % — AB (ref 11.5–14.5)
RDW: 16.4 % — AB (ref 11.5–14.5)
WBC: 6.1 10*3/uL (ref 3.6–11.0)
WBC: 5.5 10*3/uL (ref 3.6–11.0)

## 2016-01-19 LAB — LACTIC ACID, PLASMA
LACTIC ACID, VENOUS: 1.2 mmol/L (ref 0.5–2.0)
Lactic Acid, Venous: 0.6 mmol/L (ref 0.5–2.0)

## 2016-01-19 LAB — IRON AND TIBC
Iron: 42 ug/dL (ref 28–170)
SATURATION RATIOS: 8 % — AB (ref 10.4–31.8)
TIBC: 498 ug/dL — AB (ref 250–450)
UIBC: 456 ug/dL

## 2016-01-19 LAB — TROPONIN I

## 2016-01-19 LAB — GLUCOSE, CAPILLARY
GLUCOSE-CAPILLARY: 193 mg/dL — AB (ref 65–99)
GLUCOSE-CAPILLARY: 221 mg/dL — AB (ref 65–99)

## 2016-01-19 LAB — FERRITIN: FERRITIN: 55 ng/mL (ref 11–307)

## 2016-01-19 LAB — POTASSIUM: POTASSIUM: 5 mmol/L (ref 3.5–5.1)

## 2016-01-19 LAB — BRAIN NATRIURETIC PEPTIDE: B Natriuretic Peptide: 330 pg/mL — ABNORMAL HIGH (ref 0.0–100.0)

## 2016-01-19 MED ORDER — OCUVITE-LUTEIN PO CAPS
1.0000 | ORAL_CAPSULE | Freq: Every evening | ORAL | Status: DC
  Administered 2016-01-19 – 2016-01-26 (×7): 1 via ORAL
  Filled 2016-01-19 (×8): qty 1

## 2016-01-19 MED ORDER — ASPIRIN EC 81 MG PO TBEC
81.0000 mg | DELAYED_RELEASE_TABLET | Freq: Every evening | ORAL | Status: DC
  Administered 2016-01-19 – 2016-01-24 (×6): 81 mg via ORAL
  Filled 2016-01-19 (×6): qty 1

## 2016-01-19 MED ORDER — DEXTROSE 50 % IV SOLN
25.0000 g | Freq: Once | INTRAVENOUS | Status: AC
  Administered 2016-01-19: 25 g via INTRAVENOUS
  Filled 2016-01-19: qty 50

## 2016-01-19 MED ORDER — SODIUM CHLORIDE 0.9% FLUSH
3.0000 mL | Freq: Two times a day (BID) | INTRAVENOUS | Status: DC
  Administered 2016-01-20 – 2016-01-25 (×8): 3 mL via INTRAVENOUS

## 2016-01-19 MED ORDER — CALCIUM GLUCONATE 10 % IV SOLN
1.0000 g | Freq: Once | INTRAVENOUS | Status: AC
  Administered 2016-01-19: 1 g via INTRAVENOUS
  Filled 2016-01-19: qty 10

## 2016-01-19 MED ORDER — PANTOPRAZOLE SODIUM 40 MG PO TBEC
40.0000 mg | DELAYED_RELEASE_TABLET | Freq: Every evening | ORAL | Status: DC
  Administered 2016-01-20 – 2016-01-26 (×7): 40 mg via ORAL
  Filled 2016-01-19 (×7): qty 1

## 2016-01-19 MED ORDER — AZITHROMYCIN 250 MG PO TABS
250.0000 mg | ORAL_TABLET | Freq: Every day | ORAL | Status: AC
  Administered 2016-01-19 – 2016-01-22 (×4): 250 mg via ORAL
  Filled 2016-01-19 (×4): qty 1

## 2016-01-19 MED ORDER — INSULIN ASPART 100 UNIT/ML ~~LOC~~ SOLN
10.0000 [IU] | Freq: Once | SUBCUTANEOUS | Status: AC
  Administered 2016-01-19: 10 [IU] via INTRAVENOUS
  Filled 2016-01-19: qty 10

## 2016-01-19 MED ORDER — VITAMIN B-12 100 MCG PO TABS
500.0000 ug | ORAL_TABLET | Freq: Every evening | ORAL | Status: DC
  Administered 2016-01-19 – 2016-01-26 (×8): 500 ug via ORAL
  Filled 2016-01-19 (×3): qty 5
  Filled 2016-01-19: qty 1
  Filled 2016-01-19 (×3): qty 5
  Filled 2016-01-19: qty 1

## 2016-01-19 MED ORDER — IPRATROPIUM-ALBUTEROL 0.5-2.5 (3) MG/3ML IN SOLN
RESPIRATORY_TRACT | Status: AC
  Administered 2016-01-19: 3 mL via RESPIRATORY_TRACT
  Filled 2016-01-19: qty 3

## 2016-01-19 MED ORDER — TIOTROPIUM BROMIDE MONOHYDRATE 18 MCG IN CAPS
18.0000 ug | ORAL_CAPSULE | Freq: Every day | RESPIRATORY_TRACT | Status: DC
  Administered 2016-01-20 – 2016-01-21 (×2): 18 ug via RESPIRATORY_TRACT
  Filled 2016-01-19: qty 5

## 2016-01-19 MED ORDER — IPRATROPIUM-ALBUTEROL 0.5-2.5 (3) MG/3ML IN SOLN
3.0000 mL | Freq: Once | RESPIRATORY_TRACT | Status: AC
  Administered 2016-01-19: 3 mL via RESPIRATORY_TRACT

## 2016-01-19 MED ORDER — ATORVASTATIN CALCIUM 20 MG PO TABS
20.0000 mg | ORAL_TABLET | Freq: Every evening | ORAL | Status: DC
  Administered 2016-01-19 – 2016-01-26 (×8): 20 mg via ORAL
  Filled 2016-01-19 (×8): qty 1

## 2016-01-19 MED ORDER — METOPROLOL SUCCINATE ER 25 MG PO TB24
25.0000 mg | ORAL_TABLET | Freq: Every evening | ORAL | Status: DC
  Administered 2016-01-19 – 2016-01-24 (×5): 25 mg via ORAL
  Filled 2016-01-19 (×6): qty 1

## 2016-01-19 MED ORDER — DILTIAZEM HCL ER COATED BEADS 240 MG PO CP24
240.0000 mg | ORAL_CAPSULE | Freq: Every evening | ORAL | Status: DC
Start: 2016-01-19 — End: 2016-01-27
  Administered 2016-01-19 – 2016-01-26 (×8): 240 mg via ORAL
  Filled 2016-01-19 (×14): qty 1

## 2016-01-19 MED ORDER — FERROUS SULFATE 325 (65 FE) MG PO TABS
325.0000 mg | ORAL_TABLET | Freq: Every evening | ORAL | Status: DC
  Administered 2016-01-19 – 2016-01-26 (×7): 325 mg via ORAL
  Filled 2016-01-19 (×9): qty 1

## 2016-01-19 MED ORDER — SODIUM CHLORIDE 0.9 % IV SOLN
Freq: Once | INTRAVENOUS | Status: AC
  Administered 2016-01-19: 1000 mL via INTRAVENOUS

## 2016-01-19 MED ORDER — HYDROCOD POLST-CPM POLST ER 10-8 MG/5ML PO SUER
5.0000 mL | Freq: Two times a day (BID) | ORAL | Status: DC
  Administered 2016-01-19: 5 mL via ORAL
  Filled 2016-01-19: qty 5

## 2016-01-19 MED ORDER — CLOPIDOGREL BISULFATE 75 MG PO TABS
75.0000 mg | ORAL_TABLET | Freq: Every evening | ORAL | Status: DC
  Administered 2016-01-19 – 2016-01-24 (×6): 75 mg via ORAL
  Filled 2016-01-19 (×6): qty 1

## 2016-01-19 MED ORDER — CHOLECALCIFEROL 10 MCG (400 UNIT) PO TABS
800.0000 [IU] | ORAL_TABLET | Freq: Every evening | ORAL | Status: DC
  Administered 2016-01-19 – 2016-01-26 (×8): 800 [IU] via ORAL
  Filled 2016-01-19 (×10): qty 2

## 2016-01-19 MED ORDER — FENOFIBRATE 54 MG PO TABS: 54.0000 mg | ORAL_TABLET | Freq: Every day | ORAL | Status: DC

## 2016-01-19 MED ORDER — IPRATROPIUM-ALBUTEROL 0.5-2.5 (3) MG/3ML IN SOLN
3.0000 mL | RESPIRATORY_TRACT | Status: DC
  Administered 2016-01-19 – 2016-01-21 (×10): 3 mL via RESPIRATORY_TRACT
  Filled 2016-01-19 (×10): qty 3

## 2016-01-19 MED ORDER — SODIUM POLYSTYRENE SULFONATE 15 GM/60ML PO SUSP: 30.0000 g | Freq: Once | ORAL | Status: DC

## 2016-01-19 MED ORDER — FEBUXOSTAT 40 MG PO TABS
40.0000 mg | ORAL_TABLET | Freq: Every evening | ORAL | Status: DC
  Administered 2016-01-19 – 2016-01-26 (×8): 40 mg via ORAL
  Filled 2016-01-19 (×9): qty 1

## 2016-01-19 MED ORDER — METHYLPREDNISOLONE SODIUM SUCC 125 MG IJ SOLR
125.0000 mg | Freq: Once | INTRAMUSCULAR | Status: AC
  Administered 2016-01-19: 125 mg via INTRAVENOUS
  Filled 2016-01-19: qty 2

## 2016-01-19 MED ORDER — SERTRALINE HCL 50 MG PO TABS
50.0000 mg | ORAL_TABLET | Freq: Every evening | ORAL | Status: DC
  Administered 2016-01-19 – 2016-01-26 (×8): 50 mg via ORAL
  Filled 2016-01-19 (×8): qty 1

## 2016-01-19 MED ORDER — HEPARIN SODIUM (PORCINE) 5000 UNIT/ML IJ SOLN
5000.0000 [IU] | Freq: Three times a day (TID) | INTRAMUSCULAR | Status: DC
  Administered 2016-01-19 – 2016-01-25 (×17): 5000 [IU] via SUBCUTANEOUS
  Filled 2016-01-19 (×16): qty 1

## 2016-01-19 MED ORDER — ALPRAZOLAM 0.25 MG PO TABS
0.2500 mg | ORAL_TABLET | Freq: Two times a day (BID) | ORAL | Status: DC | PRN
  Administered 2016-01-19 – 2016-01-22 (×4): 0.25 mg via ORAL
  Filled 2016-01-19 (×4): qty 1

## 2016-01-19 MED ORDER — SODIUM POLYSTYRENE SULFONATE 15 GM/60ML PO SUSP
30.0000 g | Freq: Once | ORAL | Status: AC
  Administered 2016-01-19: 30 g via ORAL
  Filled 2016-01-19: qty 120

## 2016-01-19 MED ORDER — SODIUM CHLORIDE 0.9 % IV SOLN
INTRAVENOUS | Status: DC
  Administered 2016-01-19: 1000 mL via INTRAVENOUS
  Administered 2016-01-20: 07:00:00 via INTRAVENOUS

## 2016-01-19 MED ORDER — NITROGLYCERIN 0.4 MG SL SUBL: 0.4000 mg | SUBLINGUAL_TABLET | SUBLINGUAL | Status: DC | PRN

## 2016-01-19 MED ORDER — AMIODARONE HCL 200 MG PO TABS
400.0000 mg | ORAL_TABLET | Freq: Every day | ORAL | Status: DC
  Administered 2016-01-20 – 2016-01-21 (×2): 400 mg via ORAL
  Filled 2016-01-19 (×2): qty 2

## 2016-01-19 NOTE — Progress Notes (Signed)
On initial evaluation patient was noted to have low O2 saturations 8687% on 3 L via nasal cannula. She states that her chest is tight and she feels very congested short of breath. Patient also noted to have an elevated creatinine of 2.8 as well as high potassium of 6.3. Patient with known cardiac issues and recent stent placement. Patient was sent to the emergency department for further evaluation.

## 2016-01-19 NOTE — H&P (Signed)
[facility] [place] NAME: Regina Burke    MR#:  [id number]  DATE OF BIRTH:  03-28-1933  DATE OF ADMISSION:  01/19/2016  PRIMARY CARE PHYSICIAN: Tracie Harrier, MD   REQUESTING/REFERRING PHYSICIAN: [place]  CHIEF COMPLAINT:   Chief Complaint  Patient presents with  . Shortness of Breath    HISTORY OF PRESENT ILLNESS: Regina Burke  is a 82 y.o. female with a known history of COPD, CHF, hypertension, dyspnea on exertion on chronic oxygen use at home 2 L, myocardial infarction, sleep apnea, Cushing's disease, type 2 diabetes, migraine, history of lung cancer, chronic kidney disease, atrial fibrillation, diverticulitis in the past- was feeling some shortness of breath for last 1 week so she went to see Dr. Ginette Pitman, her primary care doctor- Nida Boatman antibiotic but she had reaction with that so 2 days ago she was prescribed azithromycin by him and she was feeling little bit better with that. She is to increase her oxygen at home from 2 L to 3 L to get comfort. Today was her regular scheduled appointment with [facility], which they did the blood work they found her potassium was 6 and she had worsening in her renal function, and also some hypoxia so days suggested to go to emergency room right away. She was given stabilizing medications for hyperkalemia and given to hospitalist team for admission for further management.  PAST MEDICAL HISTORY:   Past Medical History  Diagnosis Date  . COPD (chronic obstructive pulmonary disease) ([place])   . CHF (congestive heart failure) ([place])   . Hypertension   . Rapid palpitations 2014    Seen at [facility], may have been atrial fibrillation  . DOE (dyspnea on exertion)     Started after treatment for lung cancer  . High cholesterol   . Heart murmur   . Myocardial infarction ([place]) 12/22/2014   . Sleep apnea   . Cushing's disease ([place])   . Type II diabetes mellitus ([place])   . Anemia   . GERD (gastroesophageal  reflux disease)   . History of hiatal hernia   . Migraine   . Arthritis   . Gout   . Depression   . Asthma   . Dysrhythmia   . Coronary artery disease   . HOH (hard of hearing)   . Chronic kidney disease (CKD), stage III (moderate)     INCREASED KT  . Anginal pain ([place])   . Cough     CHRONIC AT NIGHT  . Edema     FEET/LEGS  . Wheezing   . Pneumonia   . Lung cancer ([place]) dx'd 2014    S/P radiation 2015  . Diverticulitis   . Multiple allergies   . Atrial fibrillation ([place])     PAST SURGICAL HISTORY: Past Surgical History  Procedure Laterality Date  . Adrenalectomy Left 1980's    "[place]"  . Abdominal hysterectomy    . Appendectomy    . Cholecystectomy    . Coronary angioplasty with stent placement  12/23/2014  . Tonsillectomy    . Fracture surgery    . Wrist fracture surgery Bilateral ~ 2000  . Breast cyst excision Left   . Tubal ligation    . Left heart catheterization with coronary angiogram N/A 12/23/2014    Procedure: LEFT HEART CATHETERIZATION WITH CORONARY ANGIOGRAM;  Surgeon: Burnell Blanks, MD;  Location: [facility] CATH LAB;  Service: Cardiovascular;  Laterality: N/A;  . Percutaneous coronary stent intervention (pci-s)  12/23/2014  Procedure: PERCUTANEOUS CORONARY STENT INTERVENTION (PCI-S);  Surgeon: Burnell Blanks, MD;  Location: [facility] CATH LAB;  Service: Cardiovascular;;  Promus 2.25x8  . Cataract extraction w/phaco Right 12/28/2015    Procedure: CATARACT EXTRACTION PHACO AND INTRAOCULAR LENS PLACEMENT (IOC);  Surgeon: Birder Robson, MD;  Location: [facility] ORS;  Service: Ophthalmology;  Laterality: Right;  [place] 48.4 AP% 19.0 CDE 9.17 Fluid Pack Lot # [id number]    SOCIAL HISTORY:  Social History  Substance Use Topics  . Smoking status: Former Smoker -- 1.00 packs/day for 45 years    Types: Cigarettes    Quit date: 04/25/1998  . Smokeless tobacco: Never Used  . Alcohol Use: No     Comment: 12/23/2014 "might have a couple mixed drinks/year"     FAMILY HISTORY:  Family History  Problem Relation Age of Onset  . Heart disease Mother   . Diabetes Mother   . Osteoarthritis Mother   . Hypertension Mother   . Heart disease Father   . Hypertension Father   . COPD Brother     DRUG ALLERGIES:  Allergies  Allergen Reactions  . Ciprofloxacin Shortness Of Breath, Itching and Rash  . Doxycycline Shortness Of Breath, Itching and Rash  . Penicillins Shortness Of Breath, Itching, Rash and Other (See Comments)    Has patient had a PCN reaction causing immediate rash, facial/tongue/throat swelling, SOB or lightheadedness with hypotension: Yes Has patient had a PCN reaction causing severe rash involving mucus membranes or skin necrosis: No Has patient had a PCN reaction that required hospitalization No Has patient had a PCN reaction occurring within the last 10 years: No If all of the above answers are "NO", then may proceed with Cephalosporin use.  . Sulfa Antibiotics Shortness Of Breath, Itching and Rash  . Morphine And Related Itching    REVIEW OF SYSTEMS:   CONSTITUTIONAL: No fever, fatigue or weakness.  EYES: No blurred or double vision.  EARS, NOSE, AND THROAT: No tinnitus or ear pain.  RESPIRATORY: No cough, shortness of breath, wheezing or hemoptysis.  CARDIOVASCULAR: No chest pain, orthopnea, edema.  GASTROINTESTINAL: No nausea, vomiting, diarrhea or abdominal pain.  GENITOURINARY: No dysuria, hematuria.  ENDOCRINE: No polyuria, nocturia,  HEMATOLOGY: No anemia, easy bruising or bleeding SKIN: No rash or lesion. MUSCULOSKELETAL: No joint pain or arthritis.   NEUROLOGIC: No tingling, numbness, weakness.  PSYCHIATRY: No anxiety or depression.   MEDICATIONS AT HOME:  Prior to Admission medications   Medication Sig Start Date End Date Taking? Authorizing Provider  ALPRAZolam (XANAX) 0.25 MG tablet Take 0.25 mg by mouth 2 (two) times daily as needed for anxiety.   Yes Historical Provider, MD  amiodarone (PACERONE) 400  MG tablet Take 400 mg by mouth daily.   Yes Historical Provider, MD  aspirin EC 81 MG tablet Take 81 mg by mouth every evening.   Yes Historical Provider, MD  atorvastatin (LIPITOR) 20 MG tablet Take 1 tablet (20 mg total) by mouth every evening. 12/03/15  Yes Minna Merritts, MD  azithromycin (ZITHROMAX) 250 MG tablet Take 250-500 mg by mouth daily. Pt is to take two tablets on day one and one tablet daily for the next four days. 01/17/16 01/22/16 Yes Historical Provider, MD  chlorpheniramine-HYDROcodone (TUSSIONEX) 10-8 MG/5ML SUER Take 5 mLs by mouth 2 (two) times daily.    Yes Historical Provider, MD  cholecalciferol (VITAMIN D) 400 UNITS TABS tablet Take 800 Units by mouth every evening.    Yes Historical Provider, MD  clopidogrel (PLAVIX) 75 MG tablet  Take 75 mg by mouth every evening.   Yes Historical Provider, MD  cyanocobalamin 500 MCG tablet Take 500 mcg by mouth every evening.   Yes Historical Provider, MD  diltiazem (DILACOR XR) 240 MG 24 hr capsule Take 240 mg by mouth every evening.   Yes Historical Provider, MD  febuxostat (ULORIC) 40 MG tablet Take 40 mg by mouth every evening.    Yes Historical Provider, MD  fenofibrate (TRICOR) 48 MG tablet Take 48 mg by mouth every evening.    Yes Historical Provider, MD  ferrous sulfate 325 (65 FE) MG EC tablet Take 325 mg by mouth every evening.    Yes Historical Provider, MD  furosemide (LASIX) 20 MG tablet Take 1 tablet (20 mg total) by mouth daily. 06/15/15  Yes Vishwanath Hande, MD  hydrochlorothiazide (HYDRODIURIL) 12.5 MG tablet Take 12.5 mg by mouth daily.    Yes Historical Provider, MD  metoprolol succinate (TOPROL-XL) 25 MG 24 hr tablet Take 25 mg by mouth every evening. Take with or immediately following a meal.   Yes Minna Merritts, MD  Multiple Vitamins-Minerals (PRESERVISION AREDS 2) CAPS Take 1 capsule by mouth every evening.    Yes Historical Provider, MD  nitroGLYCERIN (NITROSTAT) 0.4 MG SL tablet Place 1 tablet (0.4 mg total)  under the tongue every 5 (five) minutes as needed for chest pain. 12/24/14  Yes Rhonda G Barrett, PA-C  pantoprazole (PROTONIX) 40 MG tablet Take 40 mg by mouth every evening.    Yes Historical Provider, MD  sertraline (ZOLOFT) 50 MG tablet Take 50 mg by mouth every evening.    Yes Historical Provider, MD  tiotropium (SPIRIVA) 18 MCG inhalation capsule Place 18 mcg into inhaler and inhale daily.   Yes Historical Provider, MD      PHYSICAL EXAMINATION:   VITAL SIGNS: Blood pressure 106/73, pulse 84, temperature 97.6 F (36.4 C), temperature source Oral, resp. rate 12, height '5\' 5"'$  (1.651 m), weight 83.008 kg (183 lb), SpO2 100 %.  GENERAL:  82 y.o.-year-old patient lying in the bed with no acute distress.  EYES: Pupils equal, round, reactive to light and accommodation. No scleral icterus. Extraocular muscles intact.  HEENT: Head atraumatic, normocephalic. Oropharynx and nasopharynx clear.  NECK:  Supple, no jugular venous distention. No thyroid enlargement, no tenderness.  LUNGS: Normal breath sounds bilaterally, no wheezing, rales,rhonchi or crepitation. No use of accessory muscles of respiration.  CARDIOVASCULAR: S1, S2 normal. No murmurs, rubs, or gallops.  ABDOMEN: Soft, nontender, nondistended. Bowel sounds present. No organomegaly or mass.  EXTREMITIES: No pedal edema, cyanosis, or clubbing.  NEUROLOGIC: Cranial nerves II through XII are intact. Muscle strength 5/5 in all extremities. Sensation intact. Gait not checked.  PSYCHIATRIC: The patient is alert and oriented x 3.  SKIN: No obvious rash, lesion, or ulcer.   LABORATORY PANEL:   CBC  Recent Labs Lab 01/19/16 1358 01/19/16 1515  WBC 6.1 5.5  HGB 10.4* 9.8*  HCT 31.3* 29.6*  PLT 239 218  MCV 77.7* 77.2*  MCH 25.7* 25.5*  MCHC 33.0 33.1  RDW 16.1* 16.4*  LYMPHSABS 0.8* 0.7*  MONOABS 0.3 0.3  EOSABS 0.2 0.2  BASOSABS 0.0 0.0    ------------------------------------------------------------------------------------------------------------------  Chemistries   Recent Labs Lab 01/19/16 1358 01/19/16 1515  NA 136 136  K 6.3* 6.0*  CL 96* 96*  CO2 31 30  GLUCOSE 107* 92  BUN 98* 91*  CREATININE 2.80* 2.65*  CALCIUM 9.4 9.2  AST 43* 41  ALT 48 46  ALKPHOS 48  41  BILITOT 0.7 0.5   ------------------------------------------------------------------------------------------------------------------ estimated creatinine clearance is 18 mL/min (by C-G formula based on Cr of 2.65). ------------------------------------------------------------------------------------------------------------------ No results for input(s): TSH, T4TOTAL, T3FREE, THYROIDAB in the last 72 hours.  Invalid input(s): FREET3   Coagulation profile No results for input(s): INR, PROTIME in the last 168 hours. ------------------------------------------------------------------------------------------------------------------- No results for input(s): DDIMER in the last 72 hours. -------------------------------------------------------------------------------------------------------------------  Cardiac Enzymes  Recent Labs Lab 01/19/16 1515  TROPONINI <0.03   ------------------------------------------------------------------------------------------------------------------ Invalid input(s): POCBNP  ---------------------------------------------------------------------------------------------------------------  Urinalysis    Component Value Date/Time   COLORURINE YELLOW* 05/08/2015 0838   APPEARANCEUR CLEAR* 05/08/2015 0838   LABSPEC 1.011 05/08/2015 0838   PHURINE 5.0 05/08/2015 0838   GLUCOSEU NEGATIVE 05/08/2015 0838   HGBUR NEGATIVE 05/08/2015 0838   BILIRUBINUR NEGATIVE 05/08/2015 0838   KETONESUR NEGATIVE 05/08/2015 0838   PROTEINUR NEGATIVE 05/08/2015 0838   NITRITE NEGATIVE 05/08/2015 0838   LEUKOCYTESUR NEGATIVE 05/08/2015  0838     RADIOLOGY: Dg Chest 2 View  01/19/2016  CLINICAL DATA:  Dyspnea, hypoxia EXAM: CHEST  2 VIEW COMPARISON:  11/24/2015 and 01/12/2016 FINDINGS: Cardiomediastinal silhouette is stable. Again noted scarring and surgical clips in right upper lobe medially. No acute infiltrate or pleural effusion. No pulmonary edema. Osteopenia and mild degenerative changes thoracic spine. Hyperinflation again noted. IMPRESSION: No active disease. Hyperinflation again noted. Stable scarring and surgical clips in right upper lobe medially. Osteopenia and mild degenerative changes thoracic spine. Electronically Signed   By: Lahoma Crocker M.D.   On: 01/19/2016 16:28    EKG: Orders placed or performed during the hospital encounter of 01/19/16  . ED EKG  . ED EKG  Sinus rhythm with heart rate in 70s, has left bundle branch block but compared to previous EKG it is old.  IMPRESSION AND PLAN:  * Acute on chronic renal failure with hyperkalemia   Most likely due to dehydration.   IV fluid and avoid nephrotoxins and diuretics.   Monitor renal function daily.  * Hyperkalemia   ER physician gave calcium gluconate, insulin, albuterol, dextrose injection.   I will give her Kayexalate for now, no EKG changes.   Monitor on telemetry, recheck potassium in 2 hours.  * Acute COPD exacerbation   She is already on azithromycin as outpatient   Currently no active wheezing so there is no need for steroids.   I will begin nebulizer therapy and continue with inhalers.  * Coronary artery disease   Continue home medications.  * Atrial fibrillation   Continue amiodarone, metoprolol, Cardizem.   All the records are reviewed and case discussed with ED provider. Management plans discussed with the patient, family and they are in agreement.  CODE STATUS: Limited code, patient stated clearly no for chest compressions or shock if her heart stops but she would like to have ventilatory help and intubation if she had  respiratory distress. Her healthcare power of attorney are her sons and as per her they are aware about her wishes. Code Status History    Date Active Date Inactive Code Status Order ID Comments User Context   11/26/2015  4:02 AM 11/26/2015  8:59 PM Full Code [id number]  Silver Huguenin, RN Inpatient   06/14/2015  6:57 PM 06/15/2015  8:54 PM DNR [id number]  Colleen Can, MD Inpatient   06/07/2015 10:20 PM 06/14/2015  6:56 PM Full Code [id number]  Lytle Butte, MD ED   05/15/2015  3:27 PM 05/24/2015  5:04 PM Full Code [id number]  Gladstone Lighter, MD  ED   05/08/2015 11:23 AM 05/11/2015  3:13 PM Full Code [id number]  Dustin Flock, MD ED   04/15/2015 12:01 AM 04/15/2015  7:03 PM Full Code [id number]  Lance Coon, MD Inpatient   12/23/2014  1:09 PM 12/24/2014  5:17 PM Full Code [id number]  Burnell Blanks, MD Inpatient   12/23/2014  4:22 AM 12/23/2014  1:09 PM Full Code [id number]  Alwyn Pea, MD ED       TOTAL TIME TAKING CARE OF THIS PATIENT: 50 critical care minutes.    Vaughan Basta M.D on 01/19/2016   Between 7am to 6pm - Pager - [phone]  After 6pm go to [email or web address] - password [email or web address] [facility]  Office  [phone]  CC: Primary care physician; Tracie Harrier, MD   Note: This dictation was prepared with Dragon dictation along with smaller phrase technology. Any transcriptional errors that result from this process are unintentional.

## 2016-01-19 NOTE — ED Notes (Signed)
Patient has had two large bowel movements.

## 2016-01-19 NOTE — ED Notes (Signed)
Patient to ER for c/o shortness of breath and abnormal labs. Brought up to ER by cancer center RN. Patient states she saw her PCP last Thursday and was given antibiotic. Could not complete course d/t blistering in mouth, was prescribed Z-pack and had taken 3 doses of it. States she normally wears 2L O2 at all times, is now on 3L as placed by cancer center today. Patient states "I think I have the flu". Abnormal labs included hyperkalemia.

## 2016-01-19 NOTE — ED Provider Notes (Signed)
[facility] Emergency Department Provider Note        Time seen: ----------------------------------------- 3:20 PM on 01/19/2016 -----------------------------------------    I have reviewed the triage vital signs and the nursing notes.   HISTORY  Chief Complaint Shortness of Breath    HPI Regina Burke is a 82 y.o. female who presents ER for shortness of breath and abnormal lab work. Patient is brought to the cancer center by answer center nurse. Patient saw her primary care doctor last Thursday was given an antibiotic. She cannot complete the antibiotic due to blistering in her mouth. The antibiotic was changed but she hasn't felt any better. She's had increase her oxygen at home from 2 L to 3 L. Patient thinks she has influenza, complains of shortness of breath and cough but denies other complaints at this time. She was noted to be hyperkalemic at the cancer center. Oncologist is Dr. Oliva Bustard.   Past Medical History  Diagnosis Date  . COPD (chronic obstructive pulmonary disease) ([place])   . CHF (congestive heart failure) ([place])   . Hypertension   . Rapid palpitations 2014    Seen at [facility], may have been atrial fibrillation  . DOE (dyspnea on exertion)     Started after treatment for lung cancer  . High cholesterol   . Heart murmur   . Myocardial infarction ([place]) 12/22/2014   . Sleep apnea   . Cushing's disease ([place])   . Type II diabetes mellitus ([place])   . Anemia   . GERD (gastroesophageal reflux disease)   . History of hiatal hernia   . Migraine   . Arthritis   . Gout   . Depression   . Asthma   . Dysrhythmia   . Coronary artery disease   . HOH (hard of hearing)   . Chronic kidney disease (CKD), stage III (moderate)     INCREASED KT  . Anginal pain ([place])   . Cough     CHRONIC AT NIGHT  . Edema     FEET/LEGS  . Wheezing   . Pneumonia   . Lung cancer ([place]) dx'd 2014    S/P radiation 2015  . Diverticulitis   . Multiple allergies   .  Atrial fibrillation [facility])     Patient Active Problem List   Diagnosis Date Noted  . Dyspnea 11/25/2015  . Atrial fibrillation with RVR ([place])   . Diaphoresis   . Renal insufficiency   . Centrilobular emphysema ([place])   . Carcinoma of lung ([place]) 10/05/2015  . Chronic obstructive pulmonary disease ([place]) 07/14/2015  . Status post thoracentesis   . Acute on chronic diastolic CHF (congestive heart failure), NYHA class 1 ([place])   . Renal failure   . S/P thoracentesis   . Atrial fibrillation with rapid ventricular response ([place])   . H/O: lung cancer   . Pleural effusion   . Respiratory failure ([place])   . Coronary artery disease involving native coronary artery of native heart without angina pectoris   . S/P coronary artery stent placement   . Pressure ulcer 06/08/2015  . Atrial fibrillation, rapid ([place]) 06/07/2015  . Acute kidney injury ([place]) 06/07/2015  . Hyperkalemia 06/07/2015  . Chest wall pain 06/04/2015  . Acute respiratory failure ([place]) 05/16/2015  . Dehydration 05/09/2015  . Diabetes ([place]) 05/09/2015  . Closed fracture nasal bone 05/09/2015  . Paroxysmal atrial fibrillation ([place]) 05/05/2015  . Preoperative clearance 04/15/2015  . CAD (coronary artery disease) 04/14/2015  . Cancer of upper lobe  of right lung ([place]) 01/08/2015  . Allergic state 01/08/2015  . Absolute anemia 01/08/2015  . A-fib ([place]) 01/08/2015  . Chronic kidney disease 01/08/2015  . CAFL (chronic airflow limitation) ([place]) 01/08/2015  . Arthritis, degenerative 01/08/2015  . 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 Hypertension   . Rapid palpitations   . Diabetes mellitus, type 2 ([place]) 09/11/2014  . Type 2 diabetes mellitus ([place]) 09/11/2014    Past Surgical History  Procedure Laterality Date  . Adrenalectomy Left 1980's    "[place]ushings"  . Abdominal hysterectomy    . Appendectomy    . Cholecystectomy    . Coronary angioplasty with stent placement  12/23/2014  . Tonsillectomy    . Fracture surgery    .  Wrist fracture surgery Bilateral ~ 2000  . Breast cyst excision Left   . Tubal ligation    . Left heart catheterization with coronary angiogram N/A 12/23/2014    Procedure: LEFT HEART CATHETERIZATION WITH CORONARY ANGIOGRAM;  Surgeon: Burnell Blanks, MD;  Location: [facility] CATH LAB;  Service: Cardiovascular;  Laterality: N/A;  . Percutaneous coronary stent intervention (pci-s)  12/23/2014    Procedure: PERCUTANEOUS CORONARY STENT INTERVENTION (PCI-S);  Surgeon: Burnell Blanks, MD;  Location: [facility] CATH LAB;  Service: Cardiovascular;;  Promus 2.25x8  . Cataract extraction w/phaco Right 12/28/2015    Procedure: CATARACT EXTRACTION PHACO AND INTRAOCULAR LENS PLACEMENT (IOC);  Surgeon: Birder Robson, MD;  Location: [facility] ORS;  Service: Ophthalmology;  Laterality: Right;  [place] 48.4 AP% 19.0 CDE 9.17 Fluid Pack Lot # [id number]    Allergies Ciprofloxacin; Doxycycline; Penicillins; Sulfa antibiotics; and Morphine and related  Social History Social History  Substance Use Topics  . Smoking status: Former Smoker -- 1.00 packs/day for 45 years    Types: Cigarettes    Quit date: 04/25/1998  . Smokeless tobacco: Never Used  . Alcohol Use: No     Comment: 12/23/2014 "might have a couple mixed drinks/year"    Review of Systems Constitutional: Negative for fever. Eyes: Negative for visual changes. ENT: Negative for sore throat. Cardiovascular: Negative for chest pain. Respiratory: Positive shortness of breath and cough Gastrointestinal: Negative for abdominal pain, vomiting and diarrhea. Genitourinary: Negative for dysuria. Musculoskeletal: Negative for back pain. Skin: Negative for rash. Neurological: Negative for headaches, positive for weakness  10-point ROS otherwise negative.  ____________________________________________   PHYSICAL EXAM:  VITAL SIGNS: ED Triage Vitals  Enc Vitals Group     BP 01/19/16 1507 106/36 mmHg     Pulse Rate 01/19/16 1507 73     Resp 01/19/16 1507 20      Temp 01/19/16 1507 97.6 F (36.4 C)     Temp Source 01/19/16 1507 Oral     SpO2 01/19/16 1507 87 %     Weight 01/19/16 1507 183 lb (83.008 kg)     Height 01/19/16 1507 '5\' 5"'$  (1.651 m)     Head Cir --      Peak Flow --      Pain Score 01/19/16 1510 0     Pain Loc --      Pain Edu? --      Excl. in [place]? --     Constitutional: Alert and oriented. Generally ill appearing, no acute distress Eyes: Conjunctivae are normal. PERRL. Normal extraocular movements. ENT   Head: Normocephalic and atraumatic.   Nose: No congestion/rhinnorhea.   Mouth/Throat: Mucous membranes are moist.   Neck: No stridor. Cardiovascular: Normal rate, regular rhythm. No murmurs, rubs, or gallops. Respiratory: Normal respiratory effort  without tachypnea nor retractions. Scattered rales and rhonchi bilaterally Gastrointestinal: Soft and nontender. Normal bowel sounds Musculoskeletal: Nontender with normal range of motion in all extremities. No lower extremity tenderness nor edema. Neurologic:  Normal speech and language. No gross focal neurologic deficits are appreciated.  Skin:  Skin is warm, dry and intact. Pallor is noted. Psychiatric: Mood and affect are normal. Speech and behavior are normal.  ____________________________________________  EKG: Interpreted by me.Sinus rhythm with a rate of 72 bpm, normal PR interval, wide QRS, long QT interval. Left bundle branch block  ____________________________________________  ED COURSE:  Pertinent labs & imaging results that were available during my care of the patient were reviewed by me and considered in my medical decision making (see chart for details). Patient presents for shortness of breath, hypoxia and abnormal lab work. We will recheck basic lab work, obtain chest x-ray. ____________________________________________    LABS (pertinent positives/negatives)  Labs Reviewed  CBC WITH DIFFERENTIAL/PLATELET - Abnormal; Notable for the following:     Hemoglobin 9.8 (*)    HCT 29.6 (*)    MCV 77.2 (*)    MCH 25.5 (*)    RDW 16.4 (*)    Lymphs Abs 0.7 (*)    All other components within normal limits  COMPREHENSIVE METABOLIC PANEL - Abnormal; Notable for the following:    Potassium 6.0 (*)    Chloride 96 (*)    BUN 91 (*)    Creatinine, Ser 2.65 (*)    GFR calc non Af Amer 16 (*)    GFR calc Af Amer 18 (*)    All other components within normal limits  BRAIN NATRIURETIC PEPTIDE - Abnormal; Notable for the following:    B Natriuretic Peptide 330.0 (*)    All other components within normal limits  CULTURE, BLOOD (ROUTINE X 2)  CULTURE, BLOOD (ROUTINE X 2)  TROPONIN I  LACTIC ACID, PLASMA  LACTIC ACID, PLASMA   CRITICAL CARE Performed by: Earleen Newport   Total critical care time: 30 minutes  Critical care time was exclusive of separately billable procedures and treating other patients.  Critical care was necessary to treat or prevent imminent or life-threatening deterioration.  Critical care was time spent personally by me on the following activities: development of treatment plan with patient and/or surrogate as well as nursing, discussions with consultants, evaluation of patient's response to treatment, examination of patient, obtaining history from patient or surrogate, ordering and performing treatments and interventions, ordering and review of laboratory studies, ordering and review of radiographic studies, pulse oximetry and re-evaluation of patient's condition.   RADIOLOGY Images were viewed by me  Chest x-ray IMPRESSION: No active disease. Hyperinflation again noted. Stable scarring and surgical clips in right upper lobe medially. Osteopenia and mild degenerative changes thoracic spine. ____________________________________________  FINAL ASSESSMENT AND PLAN  Hyperkalemia, COPD, acute on chronic renal insufficiency  Plan: Patient with labs and imaging as dictated above. Patient presents with shortness  of breath and had outpatient labs which revealed acute on chronic renal insufficiency with hyperkalemia. She was treated with fluids, insulin, D50, Kayexalate IV calcium, Duonebs and IV Solu-Medrol. She will need to be admitted for recheck of her lab work and possibly nephrology consultation. Shortness of breath at this time is felt to be related to COPD, she may eventually need a V/Q scan if dyspnea does not improve.    Earleen Newport, MD   Note: This dictation was prepared with Dragon dictation. Any transcriptional errors that result from this process  are unintentional   Earleen Newport, MD 01/19/16 [phone]

## 2016-01-19 NOTE — Progress Notes (Signed)
Pt with O2 sat of 84-88% on 2L via [place], reports that she doesn't check her sat at home so she is not sure what it normally is. Pts O2 increased to 3L with no change, will recheck later in appt.

## 2016-01-20 ENCOUNTER — Inpatient Hospital Stay: Payer: Medicare Other

## 2016-01-20 LAB — BASIC METABOLIC PANEL
ANION GAP: 10 (ref 5–15)
BUN: 84 mg/dL — ABNORMAL HIGH (ref 6–20)
CALCIUM: 8.1 mg/dL — AB (ref 8.9–10.3)
CHLORIDE: 100 mmol/L — AB (ref 101–111)
CO2: 28 mmol/L (ref 22–32)
Creatinine, Ser: 2.6 mg/dL — ABNORMAL HIGH (ref 0.44–1.00)
GFR calc Af Amer: 19 mL/min — ABNORMAL LOW (ref >60)
GFR calc non Af Amer: 16 mL/min — ABNORMAL LOW (ref >60)
GLUCOSE: 277 mg/dL — AB (ref 65–99)
Potassium: 4.9 mmol/L (ref 3.5–5.1)
Sodium: 138 mmol/L (ref 135–145)

## 2016-01-20 LAB — CBC
HEMATOCRIT: 25.3 % — AB (ref 35.0–47.0)
HEMOGLOBIN: 8.2 g/dL — AB (ref 12.0–16.0)
MCH: 25.7 pg — AB (ref 26.0–34.0)
MCHC: 32.5 g/dL (ref 32.0–36.0)
MCV: 79.2 fL — AB (ref 80.0–100.0)
Platelets: 175 10*3/uL (ref 150–440)
RBC: 3.2 MIL/uL — ABNORMAL LOW (ref 3.80–5.20)
RDW: 16.1 % — ABNORMAL HIGH (ref 11.5–14.5)
WBC: 3.6 10*3/uL (ref 3.6–11.0)

## 2016-01-20 LAB — GLUCOSE, CAPILLARY
GLUCOSE-CAPILLARY: 235 mg/dL — AB (ref 65–99)
Glucose-Capillary: 154 mg/dL — ABNORMAL HIGH (ref 65–99)
Glucose-Capillary: 125 mg/dL — ABNORMAL HIGH (ref 65–99)

## 2016-01-20 MED ORDER — INSULIN ASPART 100 UNIT/ML ~~LOC~~ SOLN
0.0000 [IU] | Freq: Three times a day (TID) | SUBCUTANEOUS | Status: DC
  Administered 2016-01-20: 3 [IU] via SUBCUTANEOUS
  Administered 2016-01-21: 5 [IU] via SUBCUTANEOUS
  Administered 2016-01-21 – 2016-01-22 (×3): 3 [IU] via SUBCUTANEOUS
  Administered 2016-01-22: 5 [IU] via SUBCUTANEOUS
  Administered 2016-01-22 – 2016-01-23 (×2): 3 [IU] via SUBCUTANEOUS
  Administered 2016-01-23: 2 [IU] via SUBCUTANEOUS
  Administered 2016-01-23 – 2016-01-24 (×3): 5 [IU] via SUBCUTANEOUS
  Administered 2016-01-25: 2 [IU] via SUBCUTANEOUS
  Administered 2016-01-25 (×2): 3 [IU] via SUBCUTANEOUS
  Administered 2016-01-26: 11 [IU] via SUBCUTANEOUS
  Administered 2016-01-26: 2 [IU] via SUBCUTANEOUS
  Administered 2016-01-27: 3 [IU] via SUBCUTANEOUS
  Filled 2016-01-20: qty 5
  Filled 2016-01-20 (×3): qty 3
  Filled 2016-01-20: qty 2
  Filled 2016-01-20: qty 5
  Filled 2016-01-20: qty 2
  Filled 2016-01-20: qty 5
  Filled 2016-01-20: qty 3
  Filled 2016-01-20: qty 2
  Filled 2016-01-20: qty 3
  Filled 2016-01-20 (×2): qty 5
  Filled 2016-01-20 (×4): qty 3
  Filled 2016-01-20: qty 11

## 2016-01-20 MED ORDER — PREDNISONE 50 MG PO TABS
50.0000 mg | ORAL_TABLET | Freq: Every day | ORAL | Status: DC
  Administered 2016-01-21: 50 mg via ORAL
  Filled 2016-01-20: qty 1

## 2016-01-20 MED ORDER — INSULIN ASPART 100 UNIT/ML ~~LOC~~ SOLN
0.0000 [IU] | Freq: Every day | SUBCUTANEOUS | Status: DC
  Administered 2016-01-26: 2 [IU] via SUBCUTANEOUS
  Filled 2016-01-20: qty 2

## 2016-01-20 NOTE — Progress Notes (Signed)
[facility]  ROUNDING NOTE   Subjective:  Patient very well known to [place]. She was last seen in our office on 12/29/15 by Dr. Juleen China.  At that point in time EGFR was lower at 22. Her baseline EGFR is 32. She presents now with acute renal failure and hyperkalemia with a serum potassium of 6. Creatinine now down to 2.6 and potassium has normalized at 4.9. She's had recent diminished appetite which she attributes to an upper respiratory infection.   Objective:  Vital signs in last 24 hours:  Temp:  [97.5 F (36.4 C)-98.3 F (36.8 C)] 98.1 F (36.7 C) (05/25 1132) Pulse Rate:  [59-101] 59 (05/25 1132) Resp:  [11-30] 18 (05/25 1132) BP: (104-166)/(53-74) 114/74 mmHg (05/25 1132) SpO2:  [89 %-100 %] 95 % (05/25 1519) Weight:  [83.416 kg (183 lb 14.4 oz)] 83.416 kg (183 lb 14.4 oz) (05/24 2111)  Weight change:  Filed Weights   01/19/16 1507 01/19/16 2111  Weight: 83.008 kg (183 lb) 83.416 kg (183 lb 14.4 oz)    Intake/Output: I/O last 3 completed shifts: In: -  Out: 550 [Urine:550]   Intake/Output this shift:     Physical Exam: General: NAD, sitting up in bed  Head: Normocephalic, atraumatic. Moist oral mucosal membranes  Eyes: Anicteric  Neck: Supple, trachea midline  Lungs:  Clear to auscultation normal effort  Heart: Irregular, no rubs  Abdomen:  Soft, nontender, BS present  Extremities: trace peripheral edema.  Neurologic: Nonfocal, moving all four extremities  Skin: No lesions       Basic Metabolic Panel:  Recent Labs Lab 01/19/16 1358 01/19/16 1515 01/19/16 2155 01/20/16 0459  NA 136 136  --  138  K 6.3* 6.0* 5.0 4.9  CL 96* 96*  --  100*  CO2 31 30  --  28  GLUCOSE 107* 92  --  277*  BUN 98* 91*  --  84*  CREATININE 2.80* 2.65*  --  2.60*  CALCIUM 9.4 9.2  --  8.1*    Liver Function Tests:  Recent Labs Lab 01/19/16 1358 01/19/16 1515  AST 43* 41  ALT 48 46  ALKPHOS 48 41  BILITOT 0.7 0.5  PROT 7.9 7.3  ALBUMIN 4.6 4.3   No  results for input(s): LIPASE, AMYLASE in the last 168 hours. No results for input(s): AMMONIA in the last 168 hours.  CBC:  Recent Labs Lab 01/19/16 1358 01/19/16 1515 01/20/16 0459  WBC 6.1 5.5 3.6  NEUTROABS 4.8 4.2  --   HGB 10.4* 9.8* 8.2*  HCT 31.3* 29.6* 25.3*  MCV 77.7* 77.2* 79.2*  PLT 239 218 175    Cardiac Enzymes:  Recent Labs Lab 01/19/16 1515  TROPONINI <0.03    BNP: Invalid input(s): POCBNP  CBG:  Recent Labs Lab 01/19/16 2009 01/19/16 2123 01/20/16 0609  GLUCAP 193* 221* 37*    Microbiology: Results for orders placed or performed during the hospital encounter of 01/19/16  Blood culture (routine x 2)     Status: None (Preliminary result)   Collection Time: 01/19/16  3:15 PM  Result Value Ref Range Status   Specimen Description BLOOD RIGHT WRIST  Final   Special Requests BOTTLES DRAWN AEROBIC AND ANAEROBIC 8CC  Final   Culture NO GROWTH < 24 HOURS  Final   Report Status PENDING  Incomplete  Blood culture (routine x 2)     Status: None (Preliminary result)   Collection Time: 01/19/16  4:42 PM  Result Value Ref Range Status  Specimen Description BLOOD LEFT HAND  Final   Special Requests BOTTLES DRAWN AEROBIC AND ANAEROBIC [place]  Final   Culture NO GROWTH < 24 HOURS  Final   Report Status PENDING  Incomplete    Coagulation Studies: No results for input(s): LABPROT, INR in the last 72 hours.  Urinalysis: No results for input(s): COLORURINE, LABSPEC, PHURINE, GLUCOSEU, HGBUR, BILIRUBINUR, KETONESUR, PROTEINUR, UROBILINOGEN, NITRITE, LEUKOCYTESUR in the last 72 hours.  Invalid input(s): APPERANCEUR    Imaging: Dg Chest 2 View  01/19/2016  CLINICAL DATA:  Dyspnea, hypoxia EXAM: CHEST  2 VIEW COMPARISON:  11/24/2015 and 01/12/2016 FINDINGS: Cardiomediastinal silhouette is stable. Again noted scarring and surgical clips in right upper lobe medially. No acute infiltrate or pleural effusion. No pulmonary edema. Osteopenia and mild  degenerative changes thoracic spine. Hyperinflation again noted. IMPRESSION: No active disease. Hyperinflation again noted. Stable scarring and surgical clips in right upper lobe medially. Osteopenia and mild degenerative changes thoracic spine. Electronically Signed   By: Lahoma Crocker M.D.   On: 01/19/2016 16:28     Medications:   . sodium chloride 75 mL/hr at 01/20/16 0838   . amiodarone  400 mg Oral Daily  . aspirin EC  81 mg Oral QPM  . atorvastatin  20 mg Oral QPM  . azithromycin  250 mg Oral Daily  . cholecalciferol  800 Units Oral QPM  . clopidogrel  75 mg Oral QPM  . diltiazem  240 mg Oral QPM  . febuxostat  40 mg Oral QPM  . ferrous sulfate  325 mg Oral QPM  . heparin  5,000 Units Subcutaneous Q8H  . insulin aspart  0-15 Units Subcutaneous TID WC  . insulin aspart  0-5 Units Subcutaneous QHS  . ipratropium-albuterol  3 mL Nebulization Q4H  . metoprolol succinate  25 mg Oral QPM  . multivitamin-lutein  1 capsule Oral QPM  . pantoprazole  40 mg Oral QPM  . [START ON 01/21/2016] predniSONE  50 mg Oral Q breakfast  . sertraline  50 mg Oral QPM  . sodium chloride flush  3 mL Intravenous Q12H  . sodium polystyrene  30 g Oral Once  . tiotropium  18 mcg Inhalation Daily  . cyanocobalamin  500 mcg Oral QPM   ALPRAZolam, nitroGLYCERIN  Assessment/ Plan:  82 y.o. female with diabetes mellitus type II, coronary artery disease, hypertension, hyperlipidemia, generalized anxiety disorder, gout, anemia, history of lung cancer, COPD, atrial fibrillation, fatty liver disease, GERD, osteoporosis   1. Acute renal failure/Chronic kidney disease stage III with hyperkalemia and proteinuria: Endocrinology has ruled out hypoaldosteronism and believe her hyperkalemia is due to her renal failure as an outpt.  Acute renal failure likely due to poor PO intake. Baseline EGFR 32. - it appears that her acute renal failure secondary to poor by mouth intake.  Continue IV fluid hydration for now as  creatinine is declining.  Check renal ultrasound to exclude obstruction.  We may need to consider patiromer as an outpatient to treat her underlying hyperkalemia  Further plan as patient progresses.  2. Hypertension: continue diltiazem, metoprolol  3. Anemia with chronic kidney disease: hemoglobin down to 8.2. We may need to consider Epogen as an outpatient.   LOS: 1 Talaysia Pinheiro 5/25/20173:32 PM

## 2016-01-20 NOTE — Progress Notes (Signed)
[place] [facility] [place] NAME: Regina Burke    MR#:  [id number]  DATE OF BIRTH:  23-Oct-1932  SUBJECTIVE:  CHIEF COMPLAINT:   Chief Complaint  Patient presents with  . Shortness of Breath   Feels a little better today. No dysuria. On IV fluids at 100 ML per hour. Dry cough. Afebrile  REVIEW OF SYSTEMS:    Review of Systems  Constitutional: Positive for malaise/fatigue. Negative for fever and chills.  HENT: Negative for sore throat.   Eyes: Negative for blurred vision, double vision and pain.  Respiratory: Positive for cough. Negative for hemoptysis, shortness of breath and wheezing.   Cardiovascular: Negative for chest pain, palpitations, orthopnea and leg swelling.  Gastrointestinal: Negative for heartburn, nausea, vomiting, abdominal pain, diarrhea and constipation.  Genitourinary: Negative for dysuria and hematuria.  Musculoskeletal: Positive for back pain. Negative for joint pain.  Skin: Negative for rash.  Neurological: Positive for weakness. Negative for sensory change, speech change, focal weakness and headaches.  Endo/Heme/Allergies: Does not bruise/bleed easily.  Psychiatric/Behavioral: Negative for depression. The patient is not nervous/anxious.     DRUG ALLERGIES:   Allergies  Allergen Reactions  . Ciprofloxacin Shortness Of Breath, Itching and Rash  . Doxycycline Shortness Of Breath, Itching and Rash  . Penicillins Shortness Of Breath, Itching, Rash and Other (See Comments)    Has patient had a PCN reaction causing immediate rash, facial/tongue/throat swelling, SOB or lightheadedness with hypotension: Yes Has patient had a PCN reaction causing severe rash involving mucus membranes or skin necrosis: No Has patient had a PCN reaction that required hospitalization No Has patient had a PCN reaction occurring within the last 10 years: No If all of the above answers are "NO", then may proceed with Cephalosporin use.   . Sulfa Antibiotics Shortness Of Breath, Itching and Rash  . Morphine And Related Itching  . Cefuroxime Rash    blisters    VITALS:  Blood pressure 114/74, pulse 59, temperature 98.1 F (36.7 C), temperature source Oral, resp. rate 18, height '5\' 4"'$  (1.626 m), weight 83.416 kg (183 lb 14.4 oz), SpO2 91 %.  PHYSICAL EXAMINATION:   Physical Exam  GENERAL:  82 y.o.-year-old patient lying in the bed with no acute distress. Obese EYES: Pupils equal, round, reactive to light and accommodation. No scleral icterus. Extraocular muscles intact.  HEENT: Head atraumatic, normocephalic. Oropharynx and nasopharynx clear.  NECK:  Supple, no jugular venous distention. No thyroid enlargement, no tenderness.  LUNGS: Normal breath sounds bilaterally, no wheezing, rales, rhonchi. No use of accessory muscles of respiration.  CARDIOVASCULAR: S1, S2 normal. No murmurs, rubs, or gallops.  ABDOMEN: Soft, nontender, nondistended. Bowel sounds present. No organomegaly or mass.  EXTREMITIES: No cyanosis, clubbing or edema b/l.    NEUROLOGIC: Cranial nerves II through XII are intact. No focal Motor or sensory deficits b/l.   PSYCHIATRIC: The patient is alert and oriented x 3.  SKIN: No obvious rash, lesion, or ulcer.   LABORATORY PANEL:   CBC  Recent Labs Lab 01/20/16 0459  WBC 3.6  HGB 8.2*  HCT 25.3*  PLT 175   ------------------------------------------------------------------------------------------------------------------ Chemistries   Recent Labs Lab 01/19/16 1515  01/20/16 0459  NA 136  --  138  K 6.0*  < > 4.9  CL 96*  --  100*  CO2 30  --  28  GLUCOSE 92  --  277*  BUN 91*  --  84*  CREATININE 2.65*  --  2.60*  CALCIUM 9.2  --  8.1*  AST 41  --   --   ALT 46  --   --   ALKPHOS 41  --   --   BILITOT 0.5  --   --   < > = values in this interval not  displayed. ------------------------------------------------------------------------------------------------------------------  Cardiac Enzymes  Recent Labs Lab 01/19/16 1515  TROPONINI <0.03   ------------------------------------------------------------------------------------------------------------------  RADIOLOGY:  Dg Chest 2 View  01/19/2016  CLINICAL DATA:  Dyspnea, hypoxia EXAM: CHEST  2 VIEW COMPARISON:  11/24/2015 and 01/12/2016 FINDINGS: Cardiomediastinal silhouette is stable. Again noted scarring and surgical clips in right upper lobe medially. No acute infiltrate or pleural effusion. No pulmonary edema. Osteopenia and mild degenerative changes thoracic spine. Hyperinflation again noted. IMPRESSION: No active disease. Hyperinflation again noted. Stable scarring and surgical clips in right upper lobe medially. Osteopenia and mild degenerative changes thoracic spine. Electronically Signed   By: Lahoma Crocker M.D.   On: 01/19/2016 16:28     ASSESSMENT AND PLAN:   * Acute on chronic renal failure with hyperkalemia - no significant improvement  Most likely due to dehydration.  IV fluid.   Monitor renal function daily.  Discussed with Dr. Holley Raring of nephrology Monitor strict input and output  * Hyperkalemia Improved with therapy. This is due to acute renal failure.  * Acute COPD exacerbation  She is already on azithromycin as outpatient  Nebulizers. We will add steroids.  * Coronary artery disease  Continue home medications.  * Atrial fibrillation  Continue amiodarone, metoprolol, Cardizem.  * DVT prophylaxis with heparin  All the records are reviewed and case discussed with Care Management/Social Workerr. Management plans discussed with the patient, family and they are in agreement.  CODE STATUS: Partial. OK to intubate  DVT Prophylaxis: SCDs  TOTAL TIME TAKING CARE OF THIS PATIENT: 35 minutes.   POSSIBLE D/C IN 1-2 DAYS, DEPENDING ON CLINICAL  CONDITION.  Hillary Bow R M.D on 01/20/2016 at 2:24 PM  Between 7am to 6pm - Pager - [phone]  After 6pm go to [email or web address] - password [email or web address]  Office  [phone]  CC: Primary care physician; Tracie Harrier, MD  Note: This dictation was prepared with Dragon dictation along with smaller phrase technology. Any transcriptional errors that result from this process are unintentional.

## 2016-01-21 ENCOUNTER — Inpatient Hospital Stay: Payer: Medicare Other

## 2016-01-21 ENCOUNTER — Encounter: Payer: Self-pay | Admitting: Nurse Practitioner

## 2016-01-21 DIAGNOSIS — J9601 Acute respiratory failure with hypoxia: Secondary | ICD-10-CM

## 2016-01-21 DIAGNOSIS — I4891 Unspecified atrial fibrillation: Secondary | ICD-10-CM

## 2016-01-21 DIAGNOSIS — E872 Acidosis: Secondary | ICD-10-CM

## 2016-01-21 DIAGNOSIS — J069 Acute upper respiratory infection, unspecified: Secondary | ICD-10-CM

## 2016-01-21 DIAGNOSIS — N179 Acute kidney failure, unspecified: Secondary | ICD-10-CM

## 2016-01-21 DIAGNOSIS — G4733 Obstructive sleep apnea (adult) (pediatric): Secondary | ICD-10-CM

## 2016-01-21 DIAGNOSIS — R0600 Dyspnea, unspecified: Secondary | ICD-10-CM

## 2016-01-21 DIAGNOSIS — J449 Chronic obstructive pulmonary disease, unspecified: Secondary | ICD-10-CM

## 2016-01-21 DIAGNOSIS — N189 Chronic kidney disease, unspecified: Secondary | ICD-10-CM

## 2016-01-21 DIAGNOSIS — E875 Hyperkalemia: Secondary | ICD-10-CM

## 2016-01-21 DIAGNOSIS — J9602 Acute respiratory failure with hypercapnia: Secondary | ICD-10-CM

## 2016-01-21 DIAGNOSIS — J9620 Acute and chronic respiratory failure, unspecified whether with hypoxia or hypercapnia: Secondary | ICD-10-CM

## 2016-01-21 DIAGNOSIS — J9621 Acute and chronic respiratory failure with hypoxia: Secondary | ICD-10-CM

## 2016-01-21 DIAGNOSIS — J441 Chronic obstructive pulmonary disease with (acute) exacerbation: Principal | ICD-10-CM

## 2016-01-21 DIAGNOSIS — R0602 Shortness of breath: Secondary | ICD-10-CM

## 2016-01-21 LAB — CK: CK TOTAL: 85 U/L (ref 38–234)

## 2016-01-21 LAB — PROCALCITONIN: Procalcitonin: <0.1 ng/mL

## 2016-01-21 LAB — BLOOD GAS, ARTERIAL
Acid-Base Excess: 2.4 mmol/L (ref 0.0–3.0)
Allens test (pass/fail): POSITIVE — AB
BICARBONATE: 28.7 meq/L — AB (ref 21.0–28.0)
Expiratory PAP: 10
FIO2: 0.5
Inspiratory PAP: 18
MODE: POSITIVE
O2 SAT: 98.1 %
PATIENT TEMPERATURE: 37
PO2 ART: 111 mmHg — AB (ref 83.0–108.0)
RATE: 12 resp/min
pCO2 arterial: 52 mmHg — ABNORMAL HIGH (ref 32.0–48.0)
pH, Arterial: 7.35 (ref 7.350–7.450)

## 2016-01-21 LAB — LACTIC ACID, PLASMA
LACTIC ACID, VENOUS: 1.6 mmol/L (ref 0.5–2.0)
Lactic Acid, Venous: 2 mmol/L (ref 0.5–2.0)

## 2016-01-21 LAB — BASIC METABOLIC PANEL
Anion gap: 9 (ref 5–15)
BUN: 68 mg/dL — ABNORMAL HIGH (ref 6–20)
CHLORIDE: 102 mmol/L (ref 101–111)
CO2: 27 mmol/L (ref 22–32)
CREATININE: 1.96 mg/dL — AB (ref 0.44–1.00)
Calcium: 8.7 mg/dL — ABNORMAL LOW (ref 8.9–10.3)
GFR calc non Af Amer: 23 mL/min — ABNORMAL LOW (ref >60)
GFR, EST AFRICAN AMERICAN: 27 mL/min — AB (ref >60)
Glucose, Bld: 183 mg/dL — ABNORMAL HIGH (ref 65–99)
POTASSIUM: 4.4 mmol/L (ref 3.5–5.1)
SODIUM: 138 mmol/L (ref 135–145)

## 2016-01-21 LAB — GLUCOSE, CAPILLARY
GLUCOSE-CAPILLARY: 225 mg/dL — AB (ref 65–99)
Glucose-Capillary: 185 mg/dL — ABNORMAL HIGH (ref 65–99)
Glucose-Capillary: 155 mg/dL — ABNORMAL HIGH (ref 65–99)

## 2016-01-21 LAB — TROPONIN I
Troponin I: <0.03 ng/mL (ref <0.031)
Troponin I: <0.03 ng/mL (ref <0.031)

## 2016-01-21 LAB — BRAIN NATRIURETIC PEPTIDE: B NATRIURETIC PEPTIDE 5: 943 pg/mL — AB (ref 0.0–100.0)

## 2016-01-21 LAB — MRSA PCR SCREENING: MRSA BY PCR: NEGATIVE

## 2016-01-21 MED ORDER — SODIUM CHLORIDE 0.9 % IV SOLN
500.0000 mg | Freq: Two times a day (BID) | INTRAVENOUS | Status: DC
  Administered 2016-01-21 – 2016-01-22 (×3): 500 mg via INTRAVENOUS
  Filled 2016-01-21 (×4): qty 0.5

## 2016-01-21 MED ORDER — ONDANSETRON 8 MG PO TBDP
8.0000 mg | ORAL_TABLET | Freq: Three times a day (TID) | ORAL | Status: DC | PRN
  Filled 2016-01-21: qty 1

## 2016-01-21 MED ORDER — LEVALBUTEROL HCL 0.63 MG/3ML IN NEBU
0.6300 mg | INHALATION_SOLUTION | Freq: Four times a day (QID) | RESPIRATORY_TRACT | Status: AC
  Administered 2016-01-21 – 2016-01-23 (×8): 0.63 mg via RESPIRATORY_TRACT
  Filled 2016-01-21 (×8): qty 3

## 2016-01-21 MED ORDER — IPRATROPIUM-ALBUTEROL 0.5-2.5 (3) MG/3ML IN SOLN: 3.0000 mL | Freq: Four times a day (QID) | RESPIRATORY_TRACT | Status: DC

## 2016-01-21 MED ORDER — AMIODARONE HCL IN DEXTROSE 360-4.14 MG/200ML-% IV SOLN
60.0000 mg/h | INTRAVENOUS | Status: DC
  Administered 2016-01-21 (×2): 60 mg/h via INTRAVENOUS
  Filled 2016-01-21: qty 200

## 2016-01-21 MED ORDER — PREDNISONE 10 MG PO TABS
30.0000 mg | ORAL_TABLET | Freq: Every day | ORAL | Status: DC
  Administered 2016-01-22 – 2016-01-27 (×6): 30 mg via ORAL
  Filled 2016-01-21 (×6): qty 1

## 2016-01-21 MED ORDER — DILTIAZEM HCL 60 MG PO TABS
60.0000 mg | ORAL_TABLET | Freq: Once | ORAL | Status: AC
  Administered 2016-01-21: 60 mg via ORAL
  Filled 2016-01-21: qty 1

## 2016-01-21 MED ORDER — PREDNISONE 50 MG PO TABS: 60.0000 mg | ORAL_TABLET | Freq: Every day | ORAL | Status: DC

## 2016-01-21 MED ORDER — AMIODARONE HCL IN DEXTROSE 360-4.14 MG/200ML-% IV SOLN
30.0000 mg/h | INTRAVENOUS | Status: DC
  Administered 2016-01-22: 30 mg/h via INTRAVENOUS
  Filled 2016-01-21 (×2): qty 200

## 2016-01-21 MED ORDER — FUROSEMIDE 40 MG PO TABS
100.0000 mg | ORAL_TABLET | Freq: Once | ORAL | Status: AC
  Administered 2016-01-21: 100 mg via ORAL
  Filled 2016-01-21: qty 1

## 2016-01-21 MED ORDER — BUDESONIDE 0.25 MG/2ML IN SUSP
0.2500 mg | Freq: Two times a day (BID) | RESPIRATORY_TRACT | Status: DC
  Administered 2016-01-21 – 2016-01-27 (×13): 0.25 mg via RESPIRATORY_TRACT
  Filled 2016-01-21 (×13): qty 2

## 2016-01-21 MED ORDER — FUROSEMIDE 10 MG/ML IJ SOLN
60.0000 mg | Freq: Once | INTRAMUSCULAR | Status: AC
  Administered 2016-01-21: 60 mg via INTRAVENOUS

## 2016-01-21 MED ORDER — FUROSEMIDE 10 MG/ML IJ SOLN
INTRAMUSCULAR | Status: AC
  Filled 2016-01-21: qty 10

## 2016-01-21 MED ORDER — LABETALOL HCL 5 MG/ML IV SOLN: 10.0000 mg | Freq: Four times a day (QID) | INTRAVENOUS | Status: DC | PRN

## 2016-01-21 MED ORDER — AMIODARONE LOAD VIA INFUSION
150.0000 mg | Freq: Once | INTRAVENOUS | Status: AC
Start: 2016-01-21 — End: 2016-01-21
  Administered 2016-01-21: 150 mg via INTRAVENOUS
  Filled 2016-01-21: qty 83.34

## 2016-01-21 MED ORDER — SODIUM CHLORIDE 0.9 % IV SOLN: INTRAVENOUS | Status: DC

## 2016-01-21 MED ORDER — IPRATROPIUM BROMIDE 0.02 % IN SOLN
0.5000 mg | Freq: Four times a day (QID) | RESPIRATORY_TRACT | Status: AC
  Administered 2016-01-21 – 2016-01-23 (×8): 0.5 mg via RESPIRATORY_TRACT
  Filled 2016-01-21 (×8): qty 2.5

## 2016-01-21 NOTE — Procedures (Signed)
  Procedure Note: Right Femoral Central Venous Catheter Placement  AMIRIA ORRISON , [id number] , IC16A/IC16A-AA  Indications: Hemodynamic monitoring / Intravenous access  Benefits, risks (including bleeding, infection,  Injury, etc.), and alternatives explained to son who voiced understanding.  Questions were sought and answered.   Son agreed to proceed with the procedure.  Consent is signed and on chart. A time-out was completed verifying correct patient, procedure and site.  A 3 lumen catheter available at the time of procedure.  The patient was placed in a dependent position appropriate for central line placement based on the vein to be cannulated.   The patient's RIGHT Femoral Vein was prepped and draped in a sterile fashion.  1% Lidocaine WAS used to anesthetize the surrounding skin area.   A3 lumen catheter was introduced into the RIGHT Femoral Vein using Seldinger technique, visualized under ultrasound.  The catheter was threaded smoothly over the guide wire and appropriate blood return was obtained.  Each lumen of the catheter was evacuated of air and flushed with sterile saline.  The catheter was then sutured in place to the skin and a sterile dressing applied.  Perfusion to the extremity distal to the point of catheter insertion was checked and found to be adequate.     The patient tolerated the procedure well and there were no complications.  Vilinda Boehringer, MD [facility] Pager [phone] (please enter 7-digits) On Call Pager - [phone] (please enter 7-digits)

## 2016-01-21 NOTE — Progress Notes (Addendum)
Patient is having severe SOB after transferring to the [facility]. O2 sats are 90% on 4L. Dr. Earleen Newport made aware. Per MD give Lasix '100mg'$  PO, put patient on BiPAP and patient will transfer to ICU. Lasix given, Patient transferred to ICU on BiPAP, report given to Quita Skye, RN. Patient is alert and oriented. Patient has no IV access. Patient is in afib on the telemetry monitor.  Patient has no c/o pain. Made son aware that patient is transferred to room 16.

## 2016-01-21 NOTE — Progress Notes (Signed)
Pharmacy Antibiotic Note  Regina Burke is a 82 y.o. female admitted on 01/19/2016 with acute respiratory failure due to CHF/AECOPD on azithromycin PTA. CCM wants to broaden antibiotics as patient with bronchitis in outpatient setting on abx.  Pharmacy has been consulted for meropenem dosing. Patient also on azithromycin.   Plan: Meropenem 500 mg iv q 12 hours.   Height: '5\' 4"'$  (162.6 cm) Weight: 183 lb 14.4 oz (83.416 kg) IBW/kg (Calculated) : 54.7  Temp (24hrs), Avg:98.3 F (36.8 C), Min:98.1 F (36.7 C), Max:98.5 F (36.9 C)   Recent Labs Lab 01/19/16 1358 01/19/16 1515 01/19/16 1642 01/19/16 1945 01/20/16 0459 01/21/16 0910 01/21/16 1228  WBC 6.1 5.5  --   --  3.6  --   --   CREATININE 2.80* 2.65*  --   --  2.60* 1.96*  --   LATICACIDVEN  --   --  0.6 1.2  --   --  1.6    Estimated Creatinine Clearance: 23.9 mL/min (by C-G formula based on Cr of 1.96).    Allergies  Allergen Reactions  . Ciprofloxacin Shortness Of Breath, Itching and Rash  . Doxycycline Shortness Of Breath, Itching and Rash  . Penicillins Shortness Of Breath, Itching, Rash and Other (See Comments)    Has patient had a PCN reaction causing immediate rash, facial/tongue/throat swelling, SOB or lightheadedness with hypotension: Yes Has patient had a PCN reaction causing severe rash involving mucus membranes or skin necrosis: No Has patient had a PCN reaction that required hospitalization No Has patient had a PCN reaction occurring within the last 10 years: No If all of the above answers are "NO", then may proceed with Cephalosporin use.  . Sulfa Antibiotics Shortness Of Breath, Itching and Rash  . Morphine And Related Itching  . Cefuroxime Rash    blisters    Antimicrobials this admission: Azithromycin  5/24 >>  Meropenem  5/26 >>   Dose adjustments this admission:   Microbiology results: 5/24 BCx: NGTD x 2  Thank you for allowing pharmacy to be a part of this patient's care.  Ulice Dash D 01/21/2016 3:15 PM

## 2016-01-21 NOTE — Progress Notes (Signed)
Patient ID: Regina Burke, female   DOB: [birth year], 82 y.o.   MRN: [id number] [facility] PROGRESS NOTE  MAKHYA ARAVE WER:[id number] DOB: 11/26/1932 DOA: 01/19/2016 PCP: Tracie Harrier, MD  HPI/Subjective: Called by nurse that they lost iv access and patient working to breath. Patient cant complete sentence and she is asking for the bipap.  Objective: Filed Vitals:   01/21/16 0614 01/21/16 0620  BP: 114/69   Pulse: 102 108  Temp:  98.1 F (36.7 C)  Resp:      Filed Weights   01/19/16 1507 01/19/16 2111  Weight: 83.008 kg (183 lb) 83.416 kg (183 lb 14.4 oz)    ROS: Review of Systems  Unable to perform ROS Respiratory: Positive for shortness of breath.   Cardiovascular: Negative for chest pain.  Gastrointestinal: Negative for abdominal pain.  Linited with shortness of breath Exam: Physical Exam  Constitutional: She is oriented to person, place, and time.  HENT:  Nose: No mucosal edema.  Mouth/Throat: No oropharyngeal exudate or posterior oropharyngeal edema.  Eyes: Conjunctivae, EOM and lids are normal. Pupils are equal, round, and reactive to light.  Neck: No JVD present. Carotid bruit is not present. No edema present. No thyroid mass and no thyromegaly present.  Cardiovascular: S1 normal and S2 normal.  Tachycardia present.  Exam reveals no gallop.   No murmur heard. Pulses:      Dorsalis pedis pulses are 2+ on the right side, and 2+ on the left side.  Respiratory: Accessory muscle usage present. She is in respiratory distress. She has decreased breath sounds in the right upper field, the right middle field, the right lower field, the left middle field and the left lower field. She has no wheezes. She has no rhonchi. She has rales in the right lower field and the left lower field.  GI: Soft. Bowel sounds are normal. There is no tenderness.  Musculoskeletal:       Right ankle: She exhibits swelling.       Left ankle: She exhibits swelling.  Lymphadenopathy:     She has no cervical adenopathy.  Neurological: She is alert and oriented to person, place, and time. No cranial nerve deficit.  Skin: Skin is warm. No rash noted. Nails show no clubbing.  Psychiatric: She has a normal mood and affect.      Data Reviewed: Basic Metabolic Panel:  Recent Labs Lab 01/19/16 1358 01/19/16 1515 01/19/16 2155 01/20/16 0459 01/21/16 0910  NA 136 136  --  138 138  K 6.3* 6.0* 5.0 4.9 4.4  CL 96* 96*  --  100* 102  CO2 31 30  --  28 27  GLUCOSE 107* 92  --  277* 183*  BUN 98* 91*  --  84* 68*  CREATININE 2.80* 2.65*  --  2.60* 1.96*  CALCIUM 9.4 9.2  --  8.1* 8.7*   Liver Function Tests:  Recent Labs Lab 01/19/16 1358 01/19/16 1515  AST 43* 41  ALT 48 46  ALKPHOS 48 41  BILITOT 0.7 0.5  PROT 7.9 7.3  ALBUMIN 4.6 4.3   CBC:  Recent Labs Lab 01/19/16 1358 01/19/16 1515 01/20/16 0459  WBC 6.1 5.5 3.6  NEUTROABS 4.8 4.2  --   HGB 10.4* 9.8* 8.2*  HCT 31.3* 29.6* 25.3*  MCV 77.7* 77.2* 79.2*  PLT 239 218 175   Cardiac Enzymes:  Recent Labs Lab 01/19/16 1515  TROPONINI <0.03   BNP (last 3 results)  Recent Labs  06/07/15 1939 11/24/15 2223 01/19/16 1515  BNP 941.0* 300.0* 330.0*     CBG:  Recent Labs Lab 01/19/16 2123 01/20/16 0609 01/20/16 1738 01/20/16 2039 01/21/16 0754  GLUCAP 221* 235* 154* 125* 185*    Recent Results (from the past 240 hour(s))  Blood culture (routine x 2)     Status: None (Preliminary result)   Collection Time: 01/19/16  3:15 PM  Result Value Ref Range Status   Specimen Description BLOOD RIGHT WRIST  Final   Special Requests BOTTLES DRAWN AEROBIC AND ANAEROBIC 8CC  Final   Culture NO GROWTH < 24 HOURS  Final   Report Status PENDING  Incomplete  Blood culture (routine x 2)     Status: None (Preliminary result)   Collection Time: 01/19/16  4:42 PM  Result Value Ref Range Status   Specimen Description BLOOD LEFT HAND  Final   Special Requests BOTTLES DRAWN AEROBIC AND ANAEROBIC  [place]  Final   Culture NO GROWTH < 24 HOURS  Final   Report Status PENDING  Incomplete     Studies: Dg Chest 2 View  01/19/2016  CLINICAL DATA:  Dyspnea, hypoxia EXAM: CHEST  2 VIEW COMPARISON:  11/24/2015 and 01/12/2016 FINDINGS: Cardiomediastinal silhouette is stable. Again noted scarring and surgical clips in right upper lobe medially. No acute infiltrate or pleural effusion. No pulmonary edema. Osteopenia and mild degenerative changes thoracic spine. Hyperinflation again noted. IMPRESSION: No active disease. Hyperinflation again noted. Stable scarring and surgical clips in right upper lobe medially. Osteopenia and mild degenerative changes thoracic spine. Electronically Signed   By: Lahoma Crocker M.D.   On: 01/19/2016 16:28   US Renal  01/20/2016  CLINICAL DATA:  82 year old female with acute kidney failure. EXAM: RENAL / URINARY TRACT ULTRASOUND COMPLETE COMPARISON:  06/08/2015 renal ultrasound FINDINGS: Right Kidney: Length: 11 cm. Mildly increased renal echogenicity and mild renal cortical atrophy noted. Multiple renal cysts are noted, the largest measuring 2.9 cm in the mid-upper pole. There is no evidence of solid mass or hydronephrosis. Left Kidney: Length: 12 cm. Mildly increased renal echogenicity and mild renal cortical atrophy noted. Multiple renal cysts are again identified, the largest measuring 2 cm. There is no evidence of solid mass or hydronephrosis. Bladder: Appears normal for degree of bladder distention. IMPRESSION: Mild increased renal echogenicity and renal cortical atrophy likely representing medical renal disease. No evidence of hydronephrosis or solid mass. Multiple renal cysts again noted. Electronically Signed   By: Margarette Canada M.D.   On: 01/20/2016 17:10   Dg Chest Port 1 View  01/20/2016  CLINICAL DATA:  Shortness of breath for 1 week. History of lung cancer, COPD and congestive heart failure. EXAM: PORTABLE CHEST 1 VIEW COMPARISON:  01/19/2016 and 11/24/2015.  FINDINGS: 1812 hours. There is stable cardiomegaly, vascular congestion and aortic atherosclerosis. There are stable postsurgical changes in the right suprahilar region with associated asymmetric density. There is stable blunting of both costophrenic angles. No definite superimposed airspace disease or significant pleural effusion. IMPRESSION: Stable cardiomegaly, vascular congestion and possible mild edema. No focal airspace disease. Electronically Signed   By: Richardean Sale M.D.   On: 01/20/2016 18:29    Scheduled Meds: . amiodarone  400 mg Oral Daily  . aspirin EC  81 mg Oral QPM  . atorvastatin  20 mg Oral QPM  . azithromycin  250 mg Oral Daily  . budesonide (PULMICORT) nebulizer solution  0.25 mg Nebulization BID  . cholecalciferol  800 Units Oral QPM  . clopidogrel  75 mg Oral QPM  . diltiazem  240 mg Oral QPM  . febuxostat  40 mg Oral QPM  . ferrous sulfate  325 mg Oral QPM  . furosemide  100 mg Oral Once  . heparin  5,000 Units Subcutaneous Q8H  . insulin aspart  0-15 Units Subcutaneous TID WC  . insulin aspart  0-5 Units Subcutaneous QHS  . ipratropium-albuterol  3 mL Nebulization Q4H  . metoprolol succinate  25 mg Oral QPM  . multivitamin-lutein  1 capsule Oral QPM  . pantoprazole  40 mg Oral QPM  . predniSONE  50 mg Oral Q breakfast  . sertraline  50 mg Oral QPM  . sodium chloride flush  3 mL Intravenous Q12H  . sodium polystyrene  30 g Oral Once  . tiotropium  18 mcg Inhalation Daily  . cyanocobalamin  500 mcg Oral QPM   Continuous Infusions: . sodium chloride      Assessment/Plan:  1. Acute respiratory failure on chronic respiratory failure. We'll start BiPAP and transferred to the CCU for closer clinical monitoring. Pulmonary consultation. Case discussed with Dr. Stevenson Clinch. Patient is a partial code. 2. Acute diastolic congestive heart failure and Fluid overload secondary to fluids being given the entire hospital stay. Since the patient does not have any IV access I  will give 100 mg oral Lasix once. 3. COPD exacerbation with poor air entry. Patient already received 50 mg of prednisone this morning. I will add budesonide nebulizers to her DuoNeb. On Zithromax. 4. Acute kidney injury on chronic kidney disease. Creatinine down to 1.96 today which is improved and close to her baseline. 5. Anemia of chronic disease 6. Anxiety depression continue psychiatric medications 7. Hyperlipidemia unspecified on atorvastatin 8. GERD on Protonix  Code Status:     Code Status Orders        Start     Ordered   01/19/16 1957  Limited resuscitation (code)   Continuous    Question Answer Comment  In the event of cardiac or respiratory ARREST: Initiate Code Blue, Call Rapid Response Yes   In the event of cardiac or respiratory ARREST: Perform CPR No   In the event of cardiac or respiratory ARREST: Perform Intubation/Mechanical Ventilation Yes   In the event of cardiac or respiratory ARREST: Use NIPPV/BiPAp only if indicated Yes   In the event of cardiac or respiratory ARREST: Administer ACLS medications if indicated No   In the event of cardiac or respiratory ARREST: Perform Defibrillation or Cardioversion if indicated No      01/19/16 1956    Code Status History    Date Active Date Inactive Code Status Order ID Comments User Context   11/26/2015  4:02 AM 11/26/2015  8:59 PM Full Code [id number]  Silver Huguenin, RN Inpatient   06/14/2015  6:57 PM 06/15/2015  8:54 PM DNR [id number]  Colleen Can, MD Inpatient   06/07/2015 10:20 PM 06/14/2015  6:56 PM Full Code [id number]  Lytle Butte, MD ED   05/15/2015  3:27 PM 05/24/2015  5:04 PM Full Code [id number]  Gladstone Lighter, MD ED   05/08/2015 11:23 AM 05/11/2015  3:13 PM Full Code [id number]  Dustin Flock, MD ED   04/15/2015 12:01 AM 04/15/2015  7:03 PM Full Code [id number]  Lance Coon, MD Inpatient   12/23/2014  1:09 PM 12/24/2014  5:17 PM Full Code [id number]  Burnell Blanks, MD Inpatient   12/23/2014  4:22 AM  12/23/2014  1:09 PM Full Code [id number]  Alwyn Pea, MD ED    Advance Directive Documentation  Most Recent Value   Type of Advance Directive  Healthcare Power of Attorney, Living will   Pre-existing out of facility DNR order (yellow form or pink MOST form)     "MOST" Form in Place?       Family Communication: Spoke with son [name] on the phone Disposition Plan: TBD  Consultants:  Critical care specialist  Antibiotics:  Zithromax  Time spent: 35 minutes, patient critically ill and will be transferred to the ICU for closer respiratory status monitoring and BiPAP.   Loletha Grayer  [employer]

## 2016-01-21 NOTE — Progress Notes (Signed)
[facility]  ROUNDING NOTE   Subjective:  Patient having increasing shortness of breath. Patient moved over to the critical care unit. Central line placed.   Objective:  Vital signs in last 24 hours:  Temp:  [98.1 F (36.7 C)-98.5 F (36.9 C)] 98.1 F (36.7 C) (05/26 0620) Pulse Rate:  [102-122] 122 (05/26 1200) Resp:  [16-23] 23 (05/26 1200) BP: (107-156)/(55-131) 156/131 mmHg (05/26 1200) SpO2:  [95 %-98 %] 97 % (05/26 1200)  Weight change:  Filed Weights   01/19/16 1507 01/19/16 2111  Weight: 83.008 kg (183 lb) 83.416 kg (183 lb 14.4 oz)    Intake/Output: I/O last 3 completed shifts: In: 1894.2 [I.V.:1894.2] Out: 1500 [Urine:1500]   Intake/Output this shift:  Total I/O In: -  Out: 250 [Urine:250]  Physical Exam: General: Mild respiratory distress  Head: Normocephalic, atraumatic. Moist oral mucosal membranes  Eyes: Anicteric  Neck: Supple, trachea midline  Lungs:  Bilateral wheezing increased work of breathing  Heart: Irregular, no rubs  Abdomen:  Soft, nontender, BS present  Extremities: trace peripheral edema.  Neurologic: Nonfocal, moving all four extremities  Skin: No lesions       Basic Metabolic Panel:  Recent Labs Lab 01/19/16 1358 01/19/16 1515 01/19/16 2155 01/20/16 0459 01/21/16 0910  NA 136 136  --  138 138  K 6.3* 6.0* 5.0 4.9 4.4  CL 96* 96*  --  100* 102  CO2 31 30  --  28 27  GLUCOSE 107* 92  --  277* 183*  BUN 98* 91*  --  84* 68*  CREATININE 2.80* 2.65*  --  2.60* 1.96*  CALCIUM 9.4 9.2  --  8.1* 8.7*    Liver Function Tests:  Recent Labs Lab 01/19/16 1358 01/19/16 1515  AST 43* 41  ALT 48 46  ALKPHOS 48 41  BILITOT 0.7 0.5  PROT 7.9 7.3  ALBUMIN 4.6 4.3   No results for input(s): LIPASE, AMYLASE in the last 168 hours. No results for input(s): AMMONIA in the last 168 hours.  CBC:  Recent Labs Lab 01/19/16 1358 01/19/16 1515 01/20/16 0459  WBC 6.1 5.5 3.6  NEUTROABS 4.8 4.2  --   HGB 10.4*  9.8* 8.2*  HCT 31.3* 29.6* 25.3*  MCV 77.7* 77.2* 79.2*  PLT 239 218 175    Cardiac Enzymes:  Recent Labs Lab 01/19/16 1515  TROPONINI <0.03    BNP: Invalid input(s): POCBNP  CBG:  Recent Labs Lab 01/20/16 0609 01/20/16 1738 01/20/16 2039 01/21/16 0754 01/21/16 1132  GLUCAP 235* 154* 125* 185* 225*    Microbiology: Results for orders placed or performed during the hospital encounter of 01/19/16  Blood culture (routine x 2)     Status: None (Preliminary result)   Collection Time: 01/19/16  3:15 PM  Result Value Ref Range Status   Specimen Description BLOOD RIGHT WRIST  Final   Special Requests BOTTLES DRAWN AEROBIC AND ANAEROBIC 8CC  Final   Culture NO GROWTH 2 DAYS  Final   Report Status PENDING  Incomplete  Blood culture (routine x 2)     Status: None (Preliminary result)   Collection Time: 01/19/16  4:42 PM  Result Value Ref Range Status   Specimen Description BLOOD LEFT HAND  Final   Special Requests BOTTLES DRAWN AEROBIC AND ANAEROBIC [place]  Final   Culture NO GROWTH 2 DAYS  Final   Report Status PENDING  Incomplete    Coagulation Studies: No results for input(s): LABPROT, INR in the last 72 hours.  Urinalysis: No results for input(s): COLORURINE, LABSPEC, PHURINE, GLUCOSEU, HGBUR, BILIRUBINUR, KETONESUR, PROTEINUR, UROBILINOGEN, NITRITE, LEUKOCYTESUR in the last 72 hours.  Invalid input(s): APPERANCEUR    Imaging: Dg Chest 2 View  01/19/2016  CLINICAL DATA:  Dyspnea, hypoxia EXAM: CHEST  2 VIEW COMPARISON:  11/24/2015 and 01/12/2016 FINDINGS: Cardiomediastinal silhouette is stable. Again noted scarring and surgical clips in right upper lobe medially. No acute infiltrate or pleural effusion. No pulmonary edema. Osteopenia and mild degenerative changes thoracic spine. Hyperinflation again noted. IMPRESSION: No active disease. Hyperinflation again noted. Stable scarring and surgical clips in right upper lobe medially. Osteopenia and mild  degenerative changes thoracic spine. Electronically Signed   By: Lahoma Crocker M.D.   On: 01/19/2016 16:28   US Renal  01/20/2016  CLINICAL DATA:  82 year old female with acute kidney failure. EXAM: RENAL / URINARY TRACT ULTRASOUND COMPLETE COMPARISON:  06/08/2015 renal ultrasound FINDINGS: Right Kidney: Length: 11 cm. Mildly increased renal echogenicity and mild renal cortical atrophy noted. Multiple renal cysts are noted, the largest measuring 2.9 cm in the mid-upper pole. There is no evidence of solid mass or hydronephrosis. Left Kidney: Length: 12 cm. Mildly increased renal echogenicity and mild renal cortical atrophy noted. Multiple renal cysts are again identified, the largest measuring 2 cm. There is no evidence of solid mass or hydronephrosis. Bladder: Appears normal for degree of bladder distention. IMPRESSION: Mild increased renal echogenicity and renal cortical atrophy likely representing medical renal disease. No evidence of hydronephrosis or solid mass. Multiple renal cysts again noted. Electronically Signed   By: Margarette Canada M.D.   On: 01/20/2016 17:10   Dg Chest Port 1 View  01/20/2016  CLINICAL DATA:  Shortness of breath for 1 week. History of lung cancer, COPD and congestive heart failure. EXAM: PORTABLE CHEST 1 VIEW COMPARISON:  01/19/2016 and 11/24/2015. FINDINGS: 1812 hours. There is stable cardiomegaly, vascular congestion and aortic atherosclerosis. There are stable postsurgical changes in the right suprahilar region with associated asymmetric density. There is stable blunting of both costophrenic angles. No definite superimposed airspace disease or significant pleural effusion. IMPRESSION: Stable cardiomegaly, vascular congestion and possible mild edema. No focal airspace disease. Electronically Signed   By: Richardean Sale M.D.   On: 01/20/2016 18:29     Medications:   . sodium chloride     . amiodarone  400 mg Oral Daily  . aspirin EC  81 mg Oral QPM  . atorvastatin  20 mg Oral  QPM  . azithromycin  250 mg Oral Daily  . budesonide (PULMICORT) nebulizer solution  0.25 mg Nebulization BID  . cholecalciferol  800 Units Oral QPM  . clopidogrel  75 mg Oral QPM  . diltiazem  240 mg Oral QPM  . febuxostat  40 mg Oral QPM  . ferrous sulfate  325 mg Oral QPM  . heparin  5,000 Units Subcutaneous Q8H  . insulin aspart  0-15 Units Subcutaneous TID WC  . insulin aspart  0-5 Units Subcutaneous QHS  . ipratropium  0.5 mg Nebulization Q6H  . levalbuterol  0.63 mg Nebulization Q6H  . metoprolol succinate  25 mg Oral QPM  . multivitamin-lutein  1 capsule Oral QPM  . pantoprazole  40 mg Oral QPM  . [START ON 01/22/2016] predniSONE  30 mg Oral Q breakfast  . sertraline  50 mg Oral QPM  . sodium chloride flush  3 mL Intravenous Q12H  . sodium polystyrene  30 g Oral Once  . cyanocobalamin  500 mcg Oral QPM   ALPRAZolam,  nitroGLYCERIN, ondansetron  Assessment/ Plan:  82 y.o. female with diabetes mellitus type II, coronary artery disease, hypertension, hyperlipidemia, generalized anxiety disorder, gout, anemia, history of lung cancer, COPD, atrial fibrillation, fatty liver disease, GERD, osteoporosis   1. Acute renal failure/Chronic kidney disease stage III with hyperkalemia and proteinuria: Endocrinology has ruled out hypoaldosteronism and believe her hyperkalemia is due to her renal failure as an outpt.  Acute renal failure likely due to poor PO intake. Baseline EGFR 32. - creatinine down to 1.96.  BUN also down to 68.  Continue IV fluid hydration if respiratory status will allow.  2. Hypertension: lood pressure currently high at 156/131.  Blood pressure may be high now secondary to active shortness of breath.  Previously blood pressure was well controlled.  For now continue metoprolol and diltiazem.  Could consider addingIV hydralazine as needed if blood pressure continues to be high.  3. Anemia with chronic kidney disease: hemoglobin down to 8.2. f hemoglobin continues to trend  down we'll need to consider Epogen.   LOS: 2 Shrihaan Porzio 5/26/201712:45 PM

## 2016-01-21 NOTE — Consult Note (Signed)
Cardiology Consult    Patient ID: Regina Burke MRN: [id number], DOB/AGE: [birth year]   Admit date: 01/19/2016 Date of Consult: 01/21/2016  Primary Physician: Tracie Harrier, MD Primary Cardiologist: Johnny Bridge, MD  Requesting Provider: R. Wieting, MD  Patient Profile    82 y/o ? with a h/o CAD, PAF, HTN, DM, and diast CHF, who was admitted 5/24 with hypoxia, AKI.  Past Medical History   Past Medical History  Diagnosis Date  . COPD (chronic obstructive pulmonary disease) ([place])   . Chronic diastolic CHF (congestive heart failure) ([place])     a. 10/2015 Echo: EF 55-65%, Gr1 DD, mild MR, mildly dil LA, nl RV fxn, nl PASP.  Marland Kitchen Hypertensive heart disease   . High cholesterol   . Sleep apnea   . Cushing's disease ([place])   . Type II diabetes mellitus ([place])   . Anemia   . GERD (gastroesophageal reflux disease)   . History of hiatal hernia   . Migraine   . Arthritis   . Gout   . Depression   . Asthma   . Dysrhythmia   . Coronary artery disease     a. 11/2014 NSTEMI/PCI: LM nl, LAD 37m D1 30, LCX mild dzs, OM1 20p, OM2 243mOM3 90p (2.25x8 Promus Premier DES), RCA nl.   . HOH (hard of hearing)   . Chronic kidney disease (CKD), stage III (moderate)   . [place]inal pain ([place]  . Cough     CHRONIC AT NIGHT  . Edema     FEET/LEGS  . Wheezing   . History of pneumonia   . [place]    S/P radiation 2015  . Diverticulitis   . Multiple allergies   . [facility]    Past Surgical History  Procedure Laterality Date  . Adrenalectomy Left 1980's    "Cushings"  . Abdominal hysterectomy    . Appendectomy    . Cholecystectomy    . Coronary angioplasty with stent placement  12/23/2014  . Tonsillectomy    . Fracture surgery    . Wrist fracture surgery Bilateral ~ 2000  . Breast cyst excision Left   . Tubal ligation    . Left heart catheterization with coronary angiogram N/A 12/23/2014    Procedure: LEFT HEART CATHETERIZATION WITH  CORONARY ANGIOGRAM;  Surgeon: ChBurnell BlanksMD;  [facility];  Service: Cardiovascular;  Laterality: N/A;  . Percutaneous coronary stent intervention (pci-s)  12/23/2014    Procedure: PERCUTANEOUS CORONARY STENT INTERVENTION (PCI-S);  Surgeon: ChBurnell BlanksMD;  [facility];  Service: Cardiovascular;;  Promus 2.25x8  . Cataract extraction w/phaco Right 12/28/2015    Procedure: CATARACT EXTRACTION PHACO AND INTRAOCULAR LENS PLACEMENT (IOC);  Surgeon: WiBirder RobsonMD;  Location: [facility] ORS;  Service: Ophthalmology;  Laterality: Right;  [place]8.4 AP% 19.0 CDE 9.17 Fluid Pack Lot # [id number]     Allergies  Allergies  Allergen Reactions  . Ciprofloxacin Shortness Of Breath, Itching and Rash  . Doxycycline Shortness Of Breath, Itching and Rash  . Penicillins Shortness Of Breath, Itching, Rash and Other (See Comments)    Has patient had a PCN reaction causing immediate rash, facial/tongue/throat swelling, SOB or lightheadedness with hypotension: Yes Has patient had a PCN reaction causing severe rash involving mucus membranes or skin necrosis: No Has patient had a PCN reaction that required hospitalization No Has patient had a PCN reaction occurring within the last 10 years: No If all  of the above answers are "NO", then may proceed with Cephalosporin use.  . Sulfa Antibiotics Shortness Of Breath, Itching and Rash  . Morphine And Related Itching  . Cefuroxime Rash    blisters    History of Present Illness    82 y/o ? with a h/o COPD, Lung cancer, DM, HTN, PAF, diast CHF, and CAD s/p PCI/DES of the OM3 in 11/2014.  Over the past year, she has had several admissions for respiratory failure with PNA and bilat pleural effusions req thoracentesis in 03/2015, and dyspnea in 05/2015 in the setting of AF RVR, renal failure, and hyperkalemia.  She was started on amio @ that time with conversion to sinus rhythm and has remained on 400 mg daily ever since.  She is not on  [place] 2/2 h/o anemia.  She lives locally with her son and activity is somewhat limited by chronic hypoxia (wears O2 24/7).  She was in her USOH until about 1 wk ago, when she began to experience progressive dyspnea, congestion, and malaise.  She was treated by her PCP with doxycycline, which caused blistering of her tongue, and she was subsequently switched to azithromycin.  She continued to feel poorly and was seen in oncology clinic on 5/24.  She was hypoxic with sats in the 80's.  Labs revealed AKI (2.80) and hperkalemia (6.3).  She was admitted and placed on IV fluids.  Home diuretics were held.  With that, creat improved.  Review of tele reveals that she converted to AFib with rates in the 1-teens to 130's beginning @ ~ 3 am on 5/25.  She was not symptomatic.  Earlier this AM, she was noted to have increased WOB and she was tx to ICU and placed on bipap. CXR on 5/25 showed vasc congestion.  A central line was placed (prev lost iv access) and she has been treated with lasix 60 mg iv x 1.  Breathing has improved some and she is now more alert.  She remains tachy in afib.   Inpatient Medications    . amiodarone  400 mg Oral Daily  . aspirin EC  81 mg Oral QPM  . atorvastatin  20 mg Oral QPM  . azithromycin  250 mg Oral Daily  . budesonide (PULMICORT) nebulizer solution  0.25 mg Nebulization BID  . cholecalciferol  800 Units Oral QPM  . clopidogrel  75 mg Oral QPM  . diltiazem  240 mg Oral QPM  . febuxostat  40 mg Oral QPM  . ferrous sulfate  325 mg Oral QPM  . heparin  5,000 Units Subcutaneous Q8H  . insulin aspart  0-15 Units Subcutaneous TID WC  . insulin aspart  0-5 Units Subcutaneous QHS  . ipratropium  0.5 mg Nebulization Q6H  . levalbuterol  0.63 mg Nebulization Q6H  . meropenem (MERREM) IV  500 mg Intravenous Q12H  . metoprolol succinate  25 mg Oral QPM  . multivitamin-lutein  1 capsule Oral QPM  . pantoprazole  40 mg Oral QPM  . [START ON 01/22/2016] predniSONE  30 mg Oral Q breakfast   . sertraline  50 mg Oral QPM  . sodium chloride flush  3 mL Intravenous Q12H  . sodium polystyrene  30 g Oral Once  . cyanocobalamin  500 mcg Oral QPM    Family History    Family History  Problem Relation Age of Onset  . Heart disease Mother   . Diabetes Mother   . Osteoarthritis Mother   . Hypertension Mother   .  Heart disease Father   . Hypertension Father   . COPD Brother     Social History    Social History   Social History  . Marital Status: Widowed    Spouse Name: N/A  . Number of Children: N/A  . Years of Education: N/A   Occupational History  . retired    Social History Main Topics  . Smoking status: Former Smoker -- 1.00 packs/day for 45 years    Types: Cigarettes    Quit date: 04/25/1998  . Smokeless tobacco: Never Used  . Alcohol Use: No     Comment: 12/23/2014 "might have a couple mixed drinks/year"  . Drug Use: No  . Sexual Activity: No   Other Topics Concern  . Not on file   Social History Narrative   Lives locally with son.  Does not routinely exercise.     Review of Systems    General:  No chills, fever, night sweats or weight changes.  Cardiovascular:  No chest pain, +++dyspnea on exertion, no edema, orthopnea, palpitations, paroxysmal nocturnal dyspnea. Dermatological: No rash, lesions/masses Respiratory: +++ cough, dyspnea Urologic: No hematuria, dysuria Abdominal:   No nausea, vomiting, diarrhea, bright red blood per rectum, melena, or hematemesis Neurologic:  No visual changes, wkns, changes in mental status. All other systems reviewed and are otherwise negative except as noted above.  Physical Exam    Blood pressure 156/131, pulse 122, temperature 98.1 F (36.7 C), temperature source Oral, resp. rate 23, height '5\' 4"'$  (1.626 m), weight 183 lb 14.4 oz (83.416 kg), SpO2 97 %.  General: Pleasant, bipap in place.  No current complaints. Psych: flat affect. Neuro: Alert and oriented X 3. Moves all extremities spontaneously. HEENT:  Normal  Neck: Supple without bruits.  Difficult to gauge jvp in setting of bipap mask. Lungs:  Resp regular and unlabored, bibasilar crackles. Heart: IR, IR, tachy, distant heart sounds, no s3, s4, or murmurs. Abdomen: Soft, non-tender, non-distended, BS + x 4.  Extremities: No clubbing, cyanosis.  Trace bilat LE edema. DP/PT/Radials 2+ and equal bilaterally.  Labs     Recent Labs  01/19/16 1515 01/21/16 1228  CKTOTAL  --  85  TROPONINI <0.03 <0.03   Lab Results  Component Value Date   WBC 3.6 01/20/2016   HGB 8.2* 01/20/2016   HCT 25.3* 01/20/2016   MCV 79.2* 01/20/2016   PLT 175 01/20/2016    Recent Labs Lab 01/19/16 1515  01/21/16 0910  NA 136  < > 138  K 6.0*  < > 4.4  CL 96*  < > 102  CO2 30  < > 27  BUN 91*  < > 68*  CREATININE 2.65*  < > 1.96*  CALCIUM 9.2  < > 8.7*  PROT 7.3  --   --   BILITOT 0.5  --   --   ALKPHOS 41  --   --   ALT 46  --   --   AST 41  --   --   GLUCOSE 92  < > 183*  < > = values in this interval not displayed. Lab Results  Component Value Date   CHOL 202* 12/23/2014   HDL 28* 12/23/2014   LDLCALC 107* 12/23/2014   TRIG 337* 12/23/2014     Radiology Studies    Dg Chest 2 View  01/19/2016  CLINICAL DATA:  Dyspnea, hypoxia EXAM: CHEST  2 VIEW COMPARISON:  11/24/2015 and 01/12/2016 FINDINGS: Cardiomediastinal silhouette is stable. Again noted scarring and surgical clips in  right upper lobe medially. No acute infiltrate or pleural effusion. No pulmonary edema. Osteopenia and mild degenerative changes thoracic spine. Hyperinflation again noted. IMPRESSION: No active disease. Hyperinflation again noted. Stable scarring and surgical clips in right upper lobe medially. Osteopenia and mild degenerative changes thoracic spine. Electronically Signed   By: Lahoma Crocker M.D.   On: 01/19/2016 16:28   US Renal  01/20/2016  CLINICAL DATA:  82 year old female with acute kidney failure. EXAM: RENAL / URINARY TRACT ULTRASOUND COMPLETE COMPARISON:   06/08/2015 renal ultrasound FINDINGS: Right Kidney: Length: 11 cm. Mildly increased renal echogenicity and mild renal cortical atrophy noted. Multiple renal cysts are noted, the largest measuring 2.9 cm in the mid-upper pole. There is no evidence of solid mass or hydronephrosis. Left Kidney: Length: 12 cm. Mildly increased renal echogenicity and mild renal cortical atrophy noted. Multiple renal cysts are again identified, the largest measuring 2 cm. There is no evidence of solid mass or hydronephrosis. Bladder: Appears normal for degree of bladder distention. IMPRESSION: Mild increased renal echogenicity and renal cortical atrophy likely representing medical renal disease. No evidence of hydronephrosis or solid mass. Multiple renal cysts again noted. Electronically Signed   By: Margarette Canada M.D.   On: 01/20/2016 17:10   Dg Chest Port 1 View  01/20/2016  CLINICAL DATA:  Shortness of breath for 1 week. History of lung cancer, COPD and congestive heart failure. EXAM: PORTABLE CHEST 1 VIEW COMPARISON:  01/19/2016 and 11/24/2015. FINDINGS: 1812 hours. There is stable cardiomegaly, vascular congestion and aortic atherosclerosis. There are stable postsurgical changes in the right suprahilar region with associated asymmetric density. There is stable blunting of both costophrenic angles. No definite superimposed airspace disease or significant pleural effusion. IMPRESSION: Stable cardiomegaly, vascular congestion and possible mild edema. No focal airspace disease. Electronically Signed   By: Richardean Sale M.D.   On: 01/20/2016 18:29    ECG & Cardiac Imaging    Rsr, 72, lad, lae, lbbb - no acute st/t changes.  Assessment & Plan    1.  Acute on chronic respiratory failure/COPD/Acute on chronic diastolic chf:  Pt presented on 5/24 with progressive dyspnea and hypoxia along with AKI and hyperkalemia.  She had been treated with azithromycin as an outpt prior to admission for congestion, cough, and dyspnea.  She  developed worsening dyspnea earlier this AM and is currently on bipap.  CXR 5/25 did show vascular congestion in setting of rapid AF and IV hydration.  Currently more alert and responsive.  Saturating well on bipap.  Crackles on exam.  Just given 60 of IV lasix - no output as of yet.  Suspect combination of IVF and Rapid AF contributing to diast CHF exacerbation, compounded by baseline lung dzs.  Follow with diuresis.   2.  PAF w/ RVR:  Pt admitted in sinus but converted to AF @ 3am on 5/25.  Rates have been rapid.  She has not noted palpitations.  She is currently on amio 400 daily along with dilt.  Will switch to IV amio in an attempt to convert her. She is not on [facility] and is a poor candidate for it, 2/2 h/o severe anemia.  Suspect rapid rates contributing to diast failure and volume excess.  3.  AKI/CKD III:  Creat improved with hydration up to this point. Follow with diuresis.  Renal following.  4.  Hyperkalemia:  In setting of #3. Currently stable. Follow.  5.  Chronic Anemia:  H/h drifting down - likely 2/2 blood draws.  Follow.  6.  DM II:  A1c 6.4.  Per IM.  Signed, Murray Hodgkins, NP 01/21/2016, 1:09 PM

## 2016-01-21 NOTE — Progress Notes (Signed)
Patient currently in Afib 116, patient does have a history of afib.  Dr. Ree Kida. New order: diltiazem '60mg'$  PO one-time dose

## 2016-01-21 NOTE — Progress Notes (Signed)
Right Femoral Central line. Placed by [facility] MD.  Blood specimen collected, labeled and sent to lab.

## 2016-01-21 NOTE — Consult Note (Signed)
PULMONARY / CRITICAL CARE MEDICINE   Name: Regina Burke MRN: [id number] DOB: [birth year]    ADMISSION DATE:  01/19/2016 CONSULTATION DATE:  01/21/16  REFERRING MD : Dr. Earleen Newport   CHIEF COMPLAINT:     Reason for consult - no IV access - worsening respiratory status requiring bipap   HISTORY OF PRESENT ILLNESS  82 yo F Asked medical history of COPD on 2 L of oxygen at home, diastolic CHF, hypertension, history of myocardial infraction, history of sleep apnea, Cushing disease, type 2 diabetes, migraines, history of lung cancer, chronic kidney disease, proximal atrial fibrillation, seen in consultation for acute on chronic shortness of breath in the setting of atrial fibrillation. Per family members and review of chart, she has been having shortness of breath with productive sputum and palpitations over the last week or so. She saw her primary care physician who started her on azithromycin, she started to feel better, but continued to have shortness of breath and palpitations. She was brought to the hospital due to having hyperkalemia and worsening hypoxia and her cats for follow-up. Upon arrival she was given medications that should for hyperkalemia, she was started on 3 L supplemental oxygen for desaturations/hypoxia. This morning she was noted to have limited IV access and eventually lost an IV, she was not tolerating by mouth well, she started to become anxious and more hypoxic requiring BiPAP and was transferred to the ICU for further evaluation and management.   Upon arrival to the ICU - patient noted to be somnolent, but easily awaken with verbal stimulation, placed on bipap and adjust from 15/6 to 18/10. RN tried to obtain PIV access, unsuccessful.  MD placed R fem CVL.  Within 1 hours patient more alert and interactive.     SIGNIFICANT EVENTS   5/18>Saw her primary care for bronchitis, started on azithromycin 5/24> resulted from oncology follow-up for history of lung cancer, noted  to be hyperkalemic and hypoxic and 80s since emergently to the ER 5/26> resting shortness of breath in the setting of proximal atrial fibrillation and suspected URI   PAST MEDICAL HISTORY    :  Past Medical History  Diagnosis Date  . COPD (chronic obstructive pulmonary disease) ([place])   . CHF (congestive heart failure) ([place])   . Hypertension   . Rapid palpitations 2014    Seen at [facility], may have been atrial fibrillation  . DOE (dyspnea on exertion)     Started after treatment for lung cancer  . High cholesterol   . Heart murmur   . Myocardial infarction ([place]) 12/22/2014   . Sleep apnea   . Cushing's disease ([place])   . Type II diabetes mellitus ([place])   . Anemia   . GERD (gastroesophageal reflux disease)   . History of hiatal hernia   . Migraine   . Arthritis   . Gout   . Depression   . Asthma   . Dysrhythmia   . Coronary artery disease   . HOH (hard of hearing)   . Chronic kidney disease (CKD), stage III (moderate)     INCREASED KT  . Anginal pain ([place])   . Cough     CHRONIC AT NIGHT  . Edema     FEET/LEGS  . Wheezing   . Pneumonia   . Lung cancer ([place]) dx'd 2014    S/P radiation 2015  . Diverticulitis   . Multiple allergies   . Atrial fibrillation [facility])    Past Surgical History  Procedure Laterality Date  .  Adrenalectomy Left 1980's    "Cushings"  . Abdominal hysterectomy    . Appendectomy    . Cholecystectomy    . Coronary angioplasty with stent placement  12/23/2014  . Tonsillectomy    . Fracture surgery    . Wrist fracture surgery Bilateral ~ 2000  . Breast cyst excision Left   . Tubal ligation    . Left heart catheterization with coronary angiogram N/A 12/23/2014    Procedure: LEFT HEART CATHETERIZATION WITH CORONARY ANGIOGRAM;  Surgeon: Burnell Blanks, MD;  Location: [facility] CATH LAB;  Service: Cardiovascular;  Laterality: N/A;  . Percutaneous coronary stent intervention (pci-s)  12/23/2014    Procedure: PERCUTANEOUS CORONARY STENT INTERVENTION  (PCI-S);  Surgeon: Burnell Blanks, MD;  Location: [facility] CATH LAB;  Service: Cardiovascular;;  Promus 2.25x8  . Cataract extraction w/phaco Right 12/28/2015    Procedure: CATARACT EXTRACTION PHACO AND INTRAOCULAR LENS PLACEMENT (IOC);  Surgeon: Birder Robson, MD;  Location: [facility] ORS;  Service: Ophthalmology;  Laterality: Right;  [place] 48.4 AP% 19.0 CDE 9.17 Fluid Pack Lot # [id number]   Prior to Admission medications   Medication Sig Start Date End Date Taking? Authorizing Provider  ALPRAZolam (XANAX) 0.25 MG tablet Take 0.25 mg by mouth 2 (two) times daily as needed for anxiety.   Yes Historical Provider, MD  amiodarone (PACERONE) 400 MG tablet Take 400 mg by mouth daily.   Yes Historical Provider, MD  aspirin EC 81 MG tablet Take 81 mg by mouth every evening.   Yes Historical Provider, MD  atorvastatin (LIPITOR) 20 MG tablet Take 1 tablet (20 mg total) by mouth every evening. 12/03/15  Yes Minna Merritts, MD  azithromycin (ZITHROMAX) 250 MG tablet Take 250-500 mg by mouth daily. Pt is to take two tablets on day one and one tablet daily for the next four days. 01/17/16 01/22/16 Yes Historical Provider, MD  chlorpheniramine-HYDROcodone (TUSSIONEX) 10-8 MG/5ML SUER Take 5 mLs by mouth 2 (two) times daily.    Yes Historical Provider, MD  cholecalciferol (VITAMIN D) 400 UNITS TABS tablet Take 800 Units by mouth every evening.    Yes Historical Provider, MD  clopidogrel (PLAVIX) 75 MG tablet Take 75 mg by mouth every evening.   Yes Historical Provider, MD  cyanocobalamin 500 MCG tablet Take 500 mcg by mouth every evening.   Yes Historical Provider, MD  diltiazem (DILACOR XR) 240 MG 24 hr capsule Take 240 mg by mouth every evening.   Yes Historical Provider, MD  febuxostat (ULORIC) 40 MG tablet Take 40 mg by mouth every evening.    Yes Historical Provider, MD  fenofibrate (TRICOR) 48 MG tablet Take 48 mg by mouth every evening.    Yes Historical Provider, MD  ferrous sulfate 325 (65 FE) MG EC tablet  Take 325 mg by mouth every evening.    Yes Historical Provider, MD  furosemide (LASIX) 20 MG tablet Take 1 tablet (20 mg total) by mouth daily. 06/15/15  Yes Vishwanath Hande, MD  hydrochlorothiazide (HYDRODIURIL) 12.5 MG tablet Take 12.5 mg by mouth daily.    Yes Historical Provider, MD  metoprolol succinate (TOPROL-XL) 25 MG 24 hr tablet Take 25 mg by mouth every evening. Take with or immediately following a meal.   Yes Minna Merritts, MD  Multiple Vitamins-Minerals (PRESERVISION AREDS 2) CAPS Take 1 capsule by mouth every evening.    Yes Historical Provider, MD  nitroGLYCERIN (NITROSTAT) 0.4 MG SL tablet Place 1 tablet (0.4 mg total) under the tongue every 5 (five) minutes as  needed for chest pain. 12/24/14  Yes Rhonda G Barrett, PA-C  pantoprazole (PROTONIX) 40 MG tablet Take 40 mg by mouth every evening.    Yes Historical Provider, MD  sertraline (ZOLOFT) 50 MG tablet Take 50 mg by mouth every evening.    Yes Historical Provider, MD  tiotropium (SPIRIVA) 18 MCG inhalation capsule Place 18 mcg into inhaler and inhale daily.   Yes Historical Provider, MD   Allergies  Allergen Reactions  . Ciprofloxacin Shortness Of Breath, Itching and Rash  . Doxycycline Shortness Of Breath, Itching and Rash  . Penicillins Shortness Of Breath, Itching, Rash and Other (See Comments)    Has patient had a PCN reaction causing immediate rash, facial/tongue/throat swelling, SOB or lightheadedness with hypotension: Yes Has patient had a PCN reaction causing severe rash involving mucus membranes or skin necrosis: No Has patient had a PCN reaction that required hospitalization No Has patient had a PCN reaction occurring within the last 10 years: No If all of the above answers are "NO", then may proceed with Cephalosporin use.  . Sulfa Antibiotics Shortness Of Breath, Itching and Rash  . Morphine And Related Itching  . Cefuroxime Rash    blisters     FAMILY HISTORY   Family History  Problem Relation Age of  Onset  . Heart disease Mother   . Diabetes Mother   . Osteoarthritis Mother   . Hypertension Mother   . Heart disease Father   . Hypertension Father   . COPD Brother       SOCIAL HISTORY    reports that she quit smoking about 17 years ago. Her smoking use included Cigarettes. She has a 45 pack-year smoking history. She has never used smokeless tobacco. She reports that she does not drink alcohol or use illicit drugs.  Review of Systems  Constitutional: Negative for fever and chills.  Eyes: Negative for blurred vision.  Respiratory: Positive for cough, shortness of breath and wheezing.   Cardiovascular: Positive for chest pain and palpitations. Negative for PND.  Gastrointestinal: Positive for nausea. Negative for vomiting.  Genitourinary: Negative for dysuria.  Musculoskeletal: Negative for myalgias.  Skin: Negative for itching and rash.  Neurological: Negative for dizziness and headaches.  Endo/Heme/Allergies: Does not bruise/bleed easily.  Psychiatric/Behavioral: Negative for depression.      VITAL SIGNS    Temp:  [98.1 F (36.7 C)-98.5 F (36.9 C)] 98.1 F (36.7 C) (05/26 0620) Pulse Rate:  [59-119] 108 (05/26 0620) Resp:  [16-18] 16 (05/25 1944) BP: (107-135)/(55-74) 114/69 mmHg (05/26 0614) SpO2:  [91 %-98 %] 97 % (05/26 1058) HEMODYNAMICS:   VENTILATOR SETTINGS:   INTAKE / OUTPUT:  Intake/Output Summary (Last 24 hours) at 01/21/16 1111 Last data filed at 01/21/16 0929  Gross per 24 hour  Intake 1894.17 ml  Output   1200 ml  Net 694.17 ml       PHYSICAL EXAM   Physical Exam  Constitutional: She appears well-developed and well-nourished.  HENT:  Head: Normocephalic and atraumatic.  Left Ear: External ear normal.  Eyes: Conjunctivae and EOM are normal. Pupils are equal, round, and reactive to light.  Neck: Normal range of motion.  Cardiovascular:  Irregular, tachycardia  Pulmonary/Chest: She has no wheezes. She has no rales.  Moderate  respiratory distress. Moderate use ICS, gradually improved with bipap  Abdominal: Soft. Bowel sounds are normal.  Musculoskeletal: She exhibits edema.  B\l edema  Neurological:  Somnolent, easily awaken. Following commands  Skin: Skin is warm.  Nursing note and vitals  reviewed.      LABS   LABS:  CBC  Recent Labs Lab 01/19/16 1358 01/19/16 1515 01/20/16 0459  WBC 6.1 5.5 3.6  HGB 10.4* 9.8* 8.2*  HCT 31.3* 29.6* 25.3*  PLT 239 218 175   Coag's No results for input(s): APTT, INR in the last 168 hours. BMET  Recent Labs Lab 01/19/16 1515 01/19/16 2155 01/20/16 0459 01/21/16 0910  NA 136  --  138 138  K 6.0* 5.0 4.9 4.4  CL 96*  --  100* 102  CO2 30  --  28 27  BUN 91*  --  84* 68*  CREATININE 2.65*  --  2.60* 1.96*  GLUCOSE 92  --  277* 183*   Electrolytes  Recent Labs Lab 01/19/16 1515 01/20/16 0459 01/21/16 0910  CALCIUM 9.2 8.1* 8.7*   Sepsis Markers  Recent Labs Lab 01/19/16 1642 01/19/16 1945  LATICACIDVEN 0.6 1.2   ABG No results for input(s): PHART, PCO2ART, PO2ART in the last 168 hours. Liver Enzymes  Recent Labs Lab 01/19/16 1358 01/19/16 1515  AST 43* 41  ALT 48 46  ALKPHOS 48 41  BILITOT 0.7 0.5  ALBUMIN 4.6 4.3   Cardiac Enzymes  Recent Labs Lab 01/19/16 1515  TROPONINI <0.03   Glucose  Recent Labs Lab 01/19/16 2009 01/19/16 2123 01/20/16 0609 01/20/16 1738 01/20/16 2039 01/21/16 0754  GLUCAP 193* 221* 235* 154* 125* 185*     Recent Results (from the past 240 hour(s))  Blood culture (routine x 2)     Status: None (Preliminary result)   Collection Time: 01/19/16  3:15 PM  Result Value Ref Range Status   Specimen Description BLOOD RIGHT WRIST  Final   Special Requests BOTTLES DRAWN AEROBIC AND ANAEROBIC 8CC  Final   Culture NO GROWTH < 24 HOURS  Final   Report Status PENDING  Incomplete  Blood culture (routine x 2)     Status: None (Preliminary result)   Collection Time: 01/19/16  4:42 PM  Result  Value Ref Range Status   Specimen Description BLOOD LEFT HAND  Final   Special Requests BOTTLES DRAWN AEROBIC AND ANAEROBIC [place]  Final   Culture NO GROWTH < 24 HOURS  Final   Report Status PENDING  Incomplete     Current facility-administered medications:  .  0.9 %  sodium chloride infusion, , Intravenous, Continuous, Munsoor Lateef, MD .  ALPRAZolam Duanne Moron) tablet 0.25 mg, 0.25 mg, Oral, BID PRN, Vaughan Basta, MD, 0.25 mg at 01/21/16 1040 .  amiodarone (PACERONE) tablet 400 mg, 400 mg, Oral, Daily, Vaughan Basta, MD, 400 mg at 01/21/16 0919 .  aspirin EC tablet 81 mg, 81 mg, Oral, QPM, Vaughan Basta, MD, 81 mg at 01/20/16 1758 .  atorvastatin (LIPITOR) tablet 20 mg, 20 mg, Oral, QPM, Vaughan Basta, MD, 20 mg at 01/20/16 1758 .  azithromycin (ZITHROMAX) tablet 250 mg, 250 mg, Oral, Daily, Vaughan Basta, MD, 250 mg at 01/21/16 0919 .  budesonide (PULMICORT) nebulizer solution 0.25 mg, 0.25 mg, Nebulization, BID, Loletha Grayer, MD .  cholecalciferol (VITAMIN D) tablet 800 Units, 800 Units, Oral, QPM, Vaughan Basta, MD, 800 Units at 01/20/16 1758 .  clopidogrel (PLAVIX) tablet 75 mg, 75 mg, Oral, QPM, Vaughan Basta, MD, 75 mg at 01/20/16 1758 .  diltiazem (DILACOR XR) 24 hr capsule 240 mg, 240 mg, Oral, QPM, Vaughan Basta, MD, 240 mg at 01/20/16 1757 .  febuxostat (ULORIC) tablet 40 mg, 40 mg, Oral, QPM, Vaughan Basta, MD, 40 mg at 01/20/16 1758 .  ferrous sulfate EC tablet 325 mg, 325 mg, Oral, QPM, Vaughan Basta, MD, 325 mg at 01/20/16 1807 .  heparin injection 5,000 Units, 5,000 Units, Subcutaneous, Q8H, Vaughan Basta, MD, 5,000 Units at 01/21/16 0525 .  insulin aspart (novoLOG) injection 0-15 Units, 0-15 Units, Subcutaneous, TID WC, Hillary Bow, MD, 3 Units at 01/21/16 [phone]  insulin aspart (novoLOG) injection 0-5 Units, 0-5 Units, Subcutaneous, QHS, Hillary Bow, MD, 0 Units at 01/20/16  2127 .  ipratropium-albuterol (DUONEB) 0.5-2.5 (3) MG/3ML nebulizer solution 3 mL, 3 mL, Nebulization, Q4H, Vaughan Basta, MD, 3 mL at 01/21/16 1051 .  metoprolol succinate (TOPROL-XL) 24 hr tablet 25 mg, 25 mg, Oral, QPM, Vaughan Basta, MD, 25 mg at 01/20/16 1758 .  multivitamin-lutein (OCUVITE-LUTEIN) capsule 1 capsule, 1 capsule, Oral, QPM, Vaughan Basta, MD, 1 capsule at 01/20/16 1758 .  nitroGLYCERIN (NITROSTAT) SL tablet 0.4 mg, 0.4 mg, Sublingual, Q5 min PRN, Vaughan Basta, MD .  ondansetron (ZOFRAN-ODT) disintegrating tablet 8 mg, 8 mg, Oral, Q8H PRN, Loletha Grayer, MD .  pantoprazole (PROTONIX) EC tablet 40 mg, 40 mg, Oral, QPM, Vaughan Basta, MD, 40 mg at 01/20/16 1758 .  predniSONE (DELTASONE) tablet 50 mg, 50 mg, Oral, Q breakfast, Hillary Bow, MD, 50 mg at 01/21/16 7353 .  sertraline (ZOLOFT) tablet 50 mg, 50 mg, Oral, QPM, Vaughan Basta, MD, 50 mg at 01/20/16 1758 .  sodium chloride flush (NS) 0.9 % injection 3 mL, 3 mL, Intravenous, Q12H, Vaughan Basta, MD, 3 mL at 01/21/16 0920 .  sodium polystyrene (KAYEXALATE) 15 GM/60ML suspension 30 g, 30 g, Oral, Once, Vaughan Basta, MD, Stopped at 01/19/16 1953 .  tiotropium (SPIRIVA) inhalation capsule 18 mcg, 18 mcg, Inhalation, Daily, Vaughan Basta, MD, 18 mcg at 01/21/16 0920 .  vitamin B-12 (CYANOCOBALAMIN) tablet 500 mcg, 500 mcg, Oral, QPM, Vaughan Basta, MD, 500 mcg at 01/20/16 1758  IMAGING    US Renal  01/20/2016  CLINICAL DATA:  82 year old female with acute kidney failure. EXAM: RENAL / URINARY TRACT ULTRASOUND COMPLETE COMPARISON:  06/08/2015 renal ultrasound FINDINGS: Right Kidney: Length: 11 cm. Mildly increased renal echogenicity and mild renal cortical atrophy noted. Multiple renal cysts are noted, the largest measuring 2.9 cm in the mid-upper pole. There is no evidence of solid mass or hydronephrosis. Left Kidney: Length: 12 cm. Mildly  increased renal echogenicity and mild renal cortical atrophy noted. Multiple renal cysts are again identified, the largest measuring 2 cm. There is no evidence of solid mass or hydronephrosis. Bladder: Appears normal for degree of bladder distention. IMPRESSION: Mild increased renal echogenicity and renal cortical atrophy likely representing medical renal disease. No evidence of hydronephrosis or solid mass. Multiple renal cysts again noted. Electronically Signed   By: Margarette Canada M.D.   On: 01/20/2016 17:10   Dg Chest Port 1 View  01/20/2016  CLINICAL DATA:  Shortness of breath for 1 week. History of lung cancer, COPD and congestive heart failure. EXAM: PORTABLE CHEST 1 VIEW COMPARISON:  01/19/2016 and 11/24/2015. FINDINGS: 1812 hours. There is stable cardiomegaly, vascular congestion and aortic atherosclerosis. There are stable postsurgical changes in the right suprahilar region with associated asymmetric density. There is stable blunting of both costophrenic angles. No definite superimposed airspace disease or significant pleural effusion. IMPRESSION: Stable cardiomegaly, vascular congestion and possible mild edema. No focal airspace disease. Electronically Signed   By: Richardean Sale M.D.   On: 01/20/2016 18:29      Indwelling Urinary Catheter continued, requirement due to   Reason to continue Indwelling Urinary Catheter  for strict Intake/Output monitoring for hemodynamic instability   Central Line continued, requirement due to   Reason to continue [employer] of central venous pressure or other hemodynamic parameters   Ventilator continued, requirement due to, resp failure    Ventilator Sedation RASS 0 to -2   Cultures: BCx2 5/26> UC  Sputum  Antibiotics: Meropenem 5/26> Azithromycin 5/25>  Lines: R fem CVL 5/26>  ASSESSMENT/PLAN  82 year old female asked shows COPD on 2 L of oxygen, diastolic heart failure, proximal atrophy relation, transferred to the ICU due to  worsening atrial fibrillation with hypoxia and worsening dyspnea.  PULMONARY Acute on chronic hypoxic respiratory failure AECOPD COPD Dyspnea Atrial fibrillation Hx of Stage 1 Lung Ca - s/p radiation, NSCLC Hx of Pleural effusion OSA Respiratory Acidosis P:   -Started on BiPAP therapy, with significant improvement. Hx of OSA, will need bipap at night.  -Gentle diuresis -Right femoral CVL placed for IV access -bronchodilators (pulmicort, levalbuterol, atrovent), continue with steroids -Given a set URI as an outpatient, will treat with broad-spectrum antibiotics, meropenem -Maintain O2 saturation 88-92% -Supplemental oxygen -May take breaks off bipap -ABG reviewd - mild hypercapnia and respiratory acidosis - cont with intermittent bipap.  -per PMD request, will obtain CT chest without contrast (possible increasing RUL nodule).   CARDIOVASCULAR CVL - Right Fem CVL  Paroxysmal afib - uncontrolled CAD -s /p sent plced to OM of Circ dCHF HTN  P:  - Cardiology following - amio gtt started - cont with cardiac monitoring - f/u CE - gentle diuresis at this time due to vascular congestion on CXR and mild vol overload - BNP 943 - cont with metoprolol - PRN labetalol for sbp > 140, hold for HR <70  RENAL CKD  P:   - monitor renal fcn - will start hydration once respiratory can tolerate - avoid nephro toxic drugs.   HEMATOLOGIC Anemia Hx of NSCLC Stage I - s/p radiation P:  -f\u CBC -Anemia could be adding to SOB, if trending down will check Hemoccult.    INFECTIOUS Recent bronchitis P:   - on empiric meropenem due to URI in the outpatient setting with little improvement and worsening respiratory status - PCT <0.1  ENDOCRINE Hx of Cushings P:   -followed by Endo as an outpatient.  -currently on prednisone for suspected AECOPD  PSYCH Anxiety  P:   -cont with alprazolam   I have personally obtained a history, examined the patient, evaluated laboratory and  imaging results, formulated the assessment and plan and placed orders.  The Patient requires high complexity decision making for assessment and support, frequent evaluation and titration of therapies, application of advanced monitoring technologies and extensive interpretation of multiple databases. Critical Care Time devoted to patient care services described in this note is 55 minutes.   Overall, patient is critically ill, prognosis is guarded. Patient at high risk for cardiac arrest and death.   Vilinda Boehringer, MD [place] [facility] Pager [phone] (please enter 7-digits) On Call Pager [phone] (please enter 7-digits)     01/21/2016, 11:11 AM  Note: This note was prepared with Dragon dictation along with smaller phrase technology. Any transcriptional errors that result from this process are unintentional.

## 2016-01-22 DIAGNOSIS — I5033 Acute on chronic diastolic (congestive) heart failure: Secondary | ICD-10-CM | POA: Insufficient documentation

## 2016-01-22 DIAGNOSIS — J209 Acute bronchitis, unspecified: Secondary | ICD-10-CM

## 2016-01-22 DIAGNOSIS — I480 Paroxysmal atrial fibrillation: Secondary | ICD-10-CM | POA: Insufficient documentation

## 2016-01-22 DIAGNOSIS — D500 Iron deficiency anemia secondary to blood loss (chronic): Secondary | ICD-10-CM | POA: Insufficient documentation

## 2016-01-22 DIAGNOSIS — I10 Essential (primary) hypertension: Secondary | ICD-10-CM | POA: Insufficient documentation

## 2016-01-22 LAB — BASIC METABOLIC PANEL
Anion gap: 8 (ref 5–15)
BUN: 79 mg/dL — AB (ref 6–20)
CHLORIDE: 100 mmol/L — AB (ref 101–111)
CO2: 30 mmol/L (ref 22–32)
Calcium: 8.5 mg/dL — ABNORMAL LOW (ref 8.9–10.3)
Creatinine, Ser: 2.61 mg/dL — ABNORMAL HIGH (ref 0.44–1.00)
GFR calc non Af Amer: 16 mL/min — ABNORMAL LOW (ref >60)
GFR, EST AFRICAN AMERICAN: 19 mL/min — AB (ref >60)
Glucose, Bld: 145 mg/dL — ABNORMAL HIGH (ref 65–99)
Potassium: 5.2 mmol/L — ABNORMAL HIGH (ref 3.5–5.1)
SODIUM: 138 mmol/L (ref 135–145)

## 2016-01-22 LAB — CBC
HCT: 24.6 % — ABNORMAL LOW (ref 35.0–47.0)
HEMOGLOBIN: 8 g/dL — AB (ref 12.0–16.0)
MCH: 25.7 pg — AB (ref 26.0–34.0)
MCHC: 32.4 g/dL (ref 32.0–36.0)
MCV: 79.4 fL — ABNORMAL LOW (ref 80.0–100.0)
Platelets: 188 10*3/uL (ref 150–440)
RBC: 3.1 MIL/uL — AB (ref 3.80–5.20)
RDW: 16.8 % — ABNORMAL HIGH (ref 11.5–14.5)
WBC: 7 10*3/uL (ref 3.6–11.0)

## 2016-01-22 LAB — GLUCOSE, CAPILLARY
GLUCOSE-CAPILLARY: 153 mg/dL — AB (ref 65–99)
GLUCOSE-CAPILLARY: 153 mg/dL — AB (ref 65–99)
GLUCOSE-CAPILLARY: 205 mg/dL — AB (ref 65–99)
GLUCOSE-CAPILLARY: 158 mg/dL — AB (ref 65–99)
GLUCOSE-CAPILLARY: 153 mg/dL — AB (ref 65–99)

## 2016-01-22 LAB — PROCALCITONIN: PROCALCITONIN: 0.43 ng/mL

## 2016-01-22 LAB — TROPONIN I

## 2016-01-22 MED ORDER — FUROSEMIDE 10 MG/ML IJ SOLN
INTRAMUSCULAR | Status: AC
Start: 2016-01-22 — End: 2016-01-22
  Filled 2016-01-22: qty 4

## 2016-01-22 MED ORDER — FUROSEMIDE 10 MG/ML IJ SOLN
80.0000 mg | Freq: Once | INTRAMUSCULAR | Status: AC
  Administered 2016-01-22: 80 mg via INTRAVENOUS

## 2016-01-22 MED ORDER — FUROSEMIDE 10 MG/ML IJ SOLN
40.0000 mg | Freq: Once | INTRAMUSCULAR | Status: DC
  Filled 2016-01-22: qty 4

## 2016-01-22 MED ORDER — AMIODARONE HCL 200 MG PO TABS
400.0000 mg | ORAL_TABLET | Freq: Every day | ORAL | Status: DC
  Administered 2016-01-22 – 2016-01-23 (×2): 400 mg via ORAL
  Filled 2016-01-22 (×2): qty 2

## 2016-01-22 MED ORDER — SODIUM CHLORIDE 0.9 % IV SOLN
500.0000 mg | INTRAVENOUS | Status: DC
  Administered 2016-01-23 – 2016-01-24 (×2): 500 mg via INTRAVENOUS
  Filled 2016-01-22 (×2): qty 0.5

## 2016-01-22 NOTE — Progress Notes (Signed)
[facility]  ROUNDING NOTE   Subjective:  Patient was moved over to the critical care unit yesterday. It appears that she was on BiPAP for a short period of time. She is doing much better from a respiratory perspective. She was found to have bilateral pleural effusions as well as pulmonary edema. Good urine output noted.  Objective:  Vital signs in last 24 hours:  Temp:  [98.5 F (36.9 C)] 98.5 F (36.9 C) (05/26 1920) Pulse Rate:  [72-125] 74 (05/27 1100) Resp:  [13-23] 19 (05/27 1100) BP: (102-156)/(50-131) 143/55 mmHg (05/27 1100) SpO2:  [91 %-100 %] 96 % (05/27 1100)  Weight change:  Filed Weights   01/19/16 1507 01/19/16 2111  Weight: 83.008 kg (183 lb) 83.416 kg (183 lb 14.4 oz)    Intake/Output: I/O last 3 completed shifts: In: 200.4 [I.V.:200.4] Out: 2775 [Urine:2775]   Intake/Output this shift:     Physical Exam: General: No acute distress   Head: Normocephalic, atraumatic. Moist oral mucosal membranes  Eyes: Anicteric  Neck: Supple, trachea midline  Lungs:  Diminished breath sounds at the bases, normal effort   Heart: Irregular, no rubs  Abdomen:  Soft, nontender, BS present  Extremities: trace peripheral edema.  Neurologic: Nonfocal, moving all four extremities  Skin: No lesions       Basic Metabolic Panel:  Recent Labs Lab 01/19/16 1358 01/19/16 1515 01/19/16 2155 01/20/16 0459 01/21/16 0910 01/22/16 0010  NA 136 136  --  138 138 138  K 6.3* 6.0* 5.0 4.9 4.4 5.2*  CL 96* 96*  --  100* 102 100*  CO2 31 30  --  '28 27 30  '$ GLUCOSE 107* 92  --  277* 183* 145*  BUN 98* 91*  --  84* 68* 79*  CREATININE 2.80* 2.65*  --  2.60* 1.96* 2.61*  CALCIUM 9.4 9.2  --  8.1* 8.7* 8.5*    Liver Function Tests:  Recent Labs Lab 01/19/16 1358 01/19/16 1515  AST 43* 41  ALT 48 46  ALKPHOS 48 41  BILITOT 0.7 0.5  PROT 7.9 7.3  ALBUMIN 4.6 4.3   No results for input(s): LIPASE, AMYLASE in the last 168 hours. No results for input(s):  AMMONIA in the last 168 hours.  CBC:  Recent Labs Lab 01/19/16 1358 01/19/16 1515 01/20/16 0459 01/22/16 0010  WBC 6.1 5.5 3.6 7.0  NEUTROABS 4.8 4.2  --   --   HGB 10.4* 9.8* 8.2* 8.0*  HCT 31.3* 29.6* 25.3* 24.6*  MCV 77.7* 77.2* 79.2* 79.4*  PLT 239 218 175 188    Cardiac Enzymes:  Recent Labs Lab 01/19/16 1515 01/21/16 1228 01/21/16 1905 01/22/16 0010  CKTOTAL  --  85  --   --   TROPONINI <0.03 <0.03 <0.03 <0.03    BNP: Invalid input(s): POCBNP  CBG:  Recent Labs Lab 01/21/16 1132 01/21/16 1716 01/21/16 2128 01/22/16 0715 01/22/16 1107  GLUCAP 225* 153* 155* 153* 205*    Microbiology: Results for orders placed or performed during the hospital encounter of 01/19/16  Blood culture (routine x 2)     Status: None (Preliminary result)   Collection Time: 01/19/16  3:15 PM  Result Value Ref Range Status   Specimen Description BLOOD RIGHT WRIST  Final   Special Requests BOTTLES DRAWN AEROBIC AND ANAEROBIC 8CC  Final   Culture NO GROWTH 3 DAYS  Final   Report Status PENDING  Incomplete  Blood culture (routine x 2)     Status: None (Preliminary result)  Collection Time: 01/19/16  4:42 PM  Result Value Ref Range Status   Specimen Description BLOOD LEFT HAND  Final   Special Requests BOTTLES DRAWN AEROBIC AND ANAEROBIC 1CCAERO,1CCANA  Final   Culture NO GROWTH 3 DAYS  Final   Report Status PENDING  Incomplete  MRSA PCR Screening     Status: None   Collection Time: 01/21/16  3:45 PM  Result Value Ref Range Status   MRSA by PCR NEGATIVE NEGATIVE Final    Comment:        The GeneXpert MRSA Assay (FDA approved for NASAL specimens only), is one component of a comprehensive MRSA colonization surveillance program. It is not intended to diagnose MRSA infection nor to guide or monitor treatment for MRSA infections.     Coagulation Studies: No results for input(s): LABPROT, INR in the last 72 hours.  Urinalysis: No results for input(s): COLORURINE,  LABSPEC, PHURINE, GLUCOSEU, HGBUR, BILIRUBINUR, KETONESUR, PROTEINUR, UROBILINOGEN, NITRITE, LEUKOCYTESUR in the last 72 hours.  Invalid input(s): APPERANCEUR    Imaging: Ct Chest Wo Contrast  01/22/2016  CLINICAL DATA:  Respiratory failure. Shortness of breath for 1 week. EXAM: CT CHEST WITHOUT CONTRAST TECHNIQUE: Multidetector CT imaging of the chest was performed following the standard protocol without IV contrast. COMPARISON:  Radiographs 01/20/2016. Most recent chest CT 06/08/2015 FINDINGS: Mediastinum/Lymph Nodes: Atherosclerotic calcification of the thoracic aorta. Coronary artery calcifications. Multi chamber cardiac enlargement. Multiple small mediastinal lymph nodes. Limited assessment for hilar adenopathy given lack contrast. No pericardial effusion Lungs/Pleura: Right upper lobe fibrosis with small fiducial markers again seen. Irregular density is increased in the interim currently measuring 2.3 x 1.6 cm, previously 1.3 x 0.7 cm. Additionally curvilinear opacity extending towards the apex has also increased Small to moderate bilateral pleural effusions and compressive atelectasis in both lower lobes, left greater than right. Fluid tracking in the fissures bilaterally. Tiny pleural-based nodule in the right upper lobe measures 5 mm, increase conspicuity compared to prior, series 3, image 67. Left upper lobe opacity with air bronchogram and both confluent and ground-glass components image 67 series 3. Ground-glass component measures 1.6 x 1.6 cm. There is mild smooth septal thickening in the lower lobes. Trachea and bronchi are patent with minimal mucous in the trachea on the left. Upper abdomen: No acute abnormality. Cystic lesions in the spleen are again seen, grossly stable. Right adrenal adenoma is again seen. There is thinning of the renal parenchyma bilaterally with renal cysts, incompletely included. Musculoskeletal: No lytic or blastic osseous lesions. No acute abnormality. Minimal anterior  wedging of the mid lower thoracic vertebra are unchanged. IMPRESSION: 1. Moderate size bilateral pleural effusions with compressive atelectasis, smooth septal thickening consistent pulmonary edema, and cardiomegaly. Suspect fluid overload/CHF. 2. Small focal left upper lobe opacity with both confluent ground-glass opacities and internal air bronchogram, may be infectious, inflammatory, or less likely neoplastic. 3. Right apical scarring, patient with history of lung cancer post radiation. The soft tissue density adjacent to the fiducial markers is increased from prior chest CT of October 2016 and PET-CT of 2015. Additionally there is increased curvilinear density more superiorly extending towards the apex. Differential considerations include delayed postradiation change versus recurrent disease. Superimposed infection is considered but felt less likely. PET-CT may be helpful for evaluation of metabolic activity. 4. Follow-up of the left lung abnormality recommended with chest CT in 3 months, if PET-CT is not performed in the interim. Electronically Signed   By: Rubye Oaks M.D.   On: 01/22/2016 00:48   US Renal  01/20/2016  CLINICAL DATA:  82 year old female with acute kidney failure. EXAM: RENAL / URINARY TRACT ULTRASOUND COMPLETE COMPARISON:  06/08/2015 renal ultrasound FINDINGS: Right Kidney: Length: 11 cm. Mildly increased renal echogenicity and mild renal cortical atrophy noted. Multiple renal cysts are noted, the largest measuring 2.9 cm in the mid-upper pole. There is no evidence of solid mass or hydronephrosis. Left Kidney: Length: 12 cm. Mildly increased renal echogenicity and mild renal cortical atrophy noted. Multiple renal cysts are again identified, the largest measuring 2 cm. There is no evidence of solid mass or hydronephrosis. Bladder: Appears normal for degree of bladder distention. IMPRESSION: Mild increased renal echogenicity and renal cortical atrophy likely representing medical renal  disease. No evidence of hydronephrosis or solid mass. Multiple renal cysts again noted. Electronically Signed   By: Margarette Canada M.D.   On: 01/20/2016 17:10   Dg Chest Port 1 View  01/20/2016  CLINICAL DATA:  Shortness of breath for 1 week. History of lung cancer, COPD and congestive heart failure. EXAM: PORTABLE CHEST 1 VIEW COMPARISON:  01/19/2016 and 11/24/2015. FINDINGS: 1812 hours. There is stable cardiomegaly, vascular congestion and aortic atherosclerosis. There are stable postsurgical changes in the right suprahilar region with associated asymmetric density. There is stable blunting of both costophrenic angles. No definite superimposed airspace disease or significant pleural effusion. IMPRESSION: Stable cardiomegaly, vascular congestion and possible mild edema. No focal airspace disease. Electronically Signed   By: Richardean Sale M.D.   On: 01/20/2016 18:29     Medications:   . sodium chloride    . amiodarone 30 mg/hr (01/22/16 0245)   . aspirin EC  81 mg Oral QPM  . atorvastatin  20 mg Oral QPM  . budesonide (PULMICORT) nebulizer solution  0.25 mg Nebulization BID  . cholecalciferol  800 Units Oral QPM  . clopidogrel  75 mg Oral QPM  . diltiazem  240 mg Oral QPM  . febuxostat  40 mg Oral QPM  . ferrous sulfate  325 mg Oral QPM  . heparin  5,000 Units Subcutaneous Q8H  . insulin aspart  0-15 Units Subcutaneous TID WC  . insulin aspart  0-5 Units Subcutaneous QHS  . ipratropium  0.5 mg Nebulization Q6H  . levalbuterol  0.63 mg Nebulization Q6H  . [START ON 01/23/2016] meropenem (MERREM) IV  500 mg Intravenous Q24H  . metoprolol succinate  25 mg Oral QPM  . multivitamin-lutein  1 capsule Oral QPM  . pantoprazole  40 mg Oral QPM  . predniSONE  30 mg Oral Q breakfast  . sertraline  50 mg Oral QPM  . sodium chloride flush  3 mL Intravenous Q12H  . cyanocobalamin  500 mcg Oral QPM   ALPRAZolam, labetalol, nitroGLYCERIN, ondansetron  Assessment/ Plan:  82 y.o. female with  diabetes mellitus type II, coronary artery disease, hypertension, hyperlipidemia, generalized anxiety disorder, gout, anemia, history of lung cancer, COPD, atrial fibrillation, fatty liver disease, GERD, osteoporosis   1. Acute renal failure/Chronic kidney disease stage III with hyperkalemia and proteinuria: Endocrinology has ruled out hypoaldosteronism and believe her hyperkalemia is due to her renal failure as an outpt.  Acute renal failure likely due to poor PO intake. Baseline EGFR 32. - Renal function has worsened. However patient found to have bilateral effusions and mild pulmonary edema. Therefore we will stop IV fluids. Hold off on Lasix for now as she does appear to be breathing comfortably.  2. Hypertension: Blood pressure currently 143/55. Continue diltiazem and metoprolol.  3. Anemia with chronic kidney disease: Hemoglobin  has drifted down to 8.0. Continue to monitor closely. As before she may require consideration of Epogen as an outpatient.   LOS: 3 Nickolus Wadding 5/27/201711:30 AM

## 2016-01-22 NOTE — Progress Notes (Signed)
Pharmacy Antibiotic Note  Regina Burke is a 82 y.o. female admitted on 01/19/2016 with acute respiratory failure due to CHF/AECOPD on azithromycin PTA. CCM wants to broaden antibiotics as patient with bronchitis in outpatient setting on abx.  Pharmacy has been consulted for meropenem dosing. Patient also on azithromycin.   Plan: Will change to Meropenem 500 mg iv q 24 hours since patient is in AKI with Serum creatinine bump of >0.3 mg/dl; therefore will dose as if patient's estimated CrCl is 10 ml/hr.  Height: '5\' 4"'$  (162.6 cm) Weight: 183 lb 14.4 oz (83.416 kg) IBW/kg (Calculated) : 54.7  Temp (24hrs), Avg:98.5 F (36.9 C), Min:98.5 F (36.9 C), Max:98.5 F (36.9 C)   Recent Labs Lab 01/19/16 1358 01/19/16 1515 01/19/16 1642 01/19/16 1945 01/20/16 0459 01/21/16 0910 01/21/16 1228 01/21/16 1530 01/22/16 0010  WBC 6.1 5.5  --   --  3.6  --   --   --  7.0  CREATININE 2.80* 2.65*  --   --  2.60* 1.96*  --   --  2.61*  LATICACIDVEN  --   --  0.6 1.2  --   --  1.6 2.0  --     Estimated Creatinine Clearance: 18 mL/min (by C-G formula based on Cr of 2.61).    Allergies  Allergen Reactions  . Ciprofloxacin Shortness Of Breath, Itching and Rash  . Doxycycline Shortness Of Breath, Itching and Rash  . Penicillins Shortness Of Breath, Itching, Rash and Other (See Comments)    Has patient had a PCN reaction causing immediate rash, facial/tongue/throat swelling, SOB or lightheadedness with hypotension: Yes Has patient had a PCN reaction causing severe rash involving mucus membranes or skin necrosis: No Has patient had a PCN reaction that required hospitalization No Has patient had a PCN reaction occurring within the last 10 years: No If all of the above answers are "NO", then may proceed with Cephalosporin use.  . Sulfa Antibiotics Shortness Of Breath, Itching and Rash  . Morphine And Related Itching  . Cefuroxime Rash    blisters    Antimicrobials this admission: Azithromycin   5/24 >>  Meropenem  5/26 >>   Dose adjustments this admission: Merrem 500 mg IV q24 hours.   Microbiology results: 5/24 BCx: NGTD x 2  Thank you for allowing pharmacy to be a part of this patient's care.  Carol Loftin D 01/22/2016 11:24 AM

## 2016-01-22 NOTE — Plan of Care (Signed)
Problem: Pain Managment: Goal: General experience of comfort will improve Outcome: Progressing Patient has been pain free   Problem: Physical Regulation: Goal: Ability to maintain clinical measurements within normal limits will improve Outcome: Progressing VSS

## 2016-01-22 NOTE — Progress Notes (Signed)
Patient ID: Regina Burke, female   DOB: 1932/10/06, 82 y.o.   MRN: [id number] [facility] PROGRESS NOTE  CHAUNTEL WINDSOR QPR:[id number] DOB: 1933-08-07 DOA: 01/19/2016 PCP: Tracie Harrier, MD  HPI/Subjective: Patient feeling better than yesterday but still not breathing right. Still short of breath. Still cough.  Objective: Filed Vitals:   01/22/16 1342 01/22/16 1400  BP: 158/67 175/70  Pulse: 84 94  Temp:    Resp: 23 28    Filed Weights   01/19/16 1507 01/19/16 2111  Weight: 83.008 kg (183 lb) 83.416 kg (183 lb 14.4 oz)    ROS: Review of Systems  Unable to perform ROS Constitutional: Negative for fever and chills.  Eyes: Negative for blurred vision.  Respiratory: Positive for cough and shortness of breath.   Cardiovascular: Negative for chest pain.  Gastrointestinal: Negative for nausea, vomiting, abdominal pain, diarrhea and constipation.  Genitourinary: Negative for dysuria.  Musculoskeletal: Negative for joint pain.  Neurological: Negative for dizziness and headaches.   Exam: Physical Exam  Constitutional: She is oriented to person, place, and time.  HENT:  Nose: No mucosal edema.  Mouth/Throat: No oropharyngeal exudate or posterior oropharyngeal edema.  Eyes: Conjunctivae, EOM and lids are normal. Pupils are equal, round, and reactive to light.  Neck: No JVD present. Carotid bruit is not present. No edema present. No thyroid mass and no thyromegaly present.  Cardiovascular: S1 normal and S2 normal.  Tachycardia present.  Exam reveals no gallop.   No murmur heard. Pulses:      Dorsalis pedis pulses are 2+ on the right side, and 2+ on the left side.  Respiratory: No accessory muscle usage. No respiratory distress. She has decreased breath sounds in the right lower field and the left lower field. She has no wheezes. She has no rhonchi. She has rales in the right lower field and the left lower field.  GI: Soft. Bowel sounds are normal. There is no tenderness.   Musculoskeletal:       Right ankle: She exhibits swelling.       Left ankle: She exhibits swelling.  Lymphadenopathy:    She has no cervical adenopathy.  Neurological: She is alert and oriented to person, place, and time. No cranial nerve deficit.  Skin: Skin is warm. No rash noted. Nails show no clubbing.  Psychiatric: She has a normal mood and affect.      Data Reviewed: Basic Metabolic Panel:  Recent Labs Lab 01/19/16 1358 01/19/16 1515 01/19/16 2155 01/20/16 0459 01/21/16 0910 01/22/16 0010  NA 136 136  --  138 138 138  K 6.3* 6.0* 5.0 4.9 4.4 5.2*  CL 96* 96*  --  100* 102 100*  CO2 31 30  --  '28 27 30  '$ GLUCOSE 107* 92  --  277* 183* 145*  BUN 98* 91*  --  84* 68* 79*  CREATININE 2.80* 2.65*  --  2.60* 1.96* 2.61*  CALCIUM 9.4 9.2  --  8.1* 8.7* 8.5*   Liver Function Tests:  Recent Labs Lab 01/19/16 1358 01/19/16 1515  AST 43* 41  ALT 48 46  ALKPHOS 48 41  BILITOT 0.7 0.5  PROT 7.9 7.3  ALBUMIN 4.6 4.3   CBC:  Recent Labs Lab 01/19/16 1358 01/19/16 1515 01/20/16 0459 01/22/16 0010  WBC 6.1 5.5 3.6 7.0  NEUTROABS 4.8 4.2  --   --   HGB 10.4* 9.8* 8.2* 8.0*  HCT 31.3* 29.6* 25.3* 24.6*  MCV 77.7* 77.2* 79.2* 79.4*  PLT 239 218 175 188  Cardiac Enzymes:  Recent Labs Lab 01/19/16 1515 01/21/16 1228 01/21/16 1905 01/22/16 0010  CKTOTAL  --  85  --   --   TROPONINI <0.03 <0.03 <0.03 <0.03   BNP (last 3 results)  Recent Labs  11/24/15 2223 01/19/16 1515 01/21/16 1228  BNP 300.0* 330.0* 943.0*     CBG:  Recent Labs Lab 01/21/16 1132 01/21/16 1716 01/21/16 2128 01/22/16 0715 01/22/16 1107  GLUCAP 225* 153* 155* 153* 205*    Recent Results (from the past 240 hour(s))  Blood culture (routine x 2)     Status: None (Preliminary result)   Collection Time: 01/19/16  3:15 PM  Result Value Ref Range Status   Specimen Description BLOOD RIGHT WRIST  Final   Special Requests BOTTLES DRAWN AEROBIC AND ANAEROBIC 8CC  Final    Culture NO GROWTH 3 DAYS  Final   Report Status PENDING  Incomplete  Blood culture (routine x 2)     Status: None (Preliminary result)   Collection Time: 01/19/16  4:42 PM  Result Value Ref Range Status   Specimen Description BLOOD LEFT HAND  Final   Special Requests BOTTLES DRAWN AEROBIC AND ANAEROBIC [place]  Final   Culture NO GROWTH 3 DAYS  Final   Report Status PENDING  Incomplete  MRSA PCR Screening     Status: None   Collection Time: 01/21/16  3:45 PM  Result Value Ref Range Status   MRSA by PCR NEGATIVE NEGATIVE Final    Comment:        The GeneXpert MRSA Assay (FDA approved for NASAL specimens only), is one component of a comprehensive MRSA colonization surveillance program. It is not intended to diagnose MRSA infection nor to guide or monitor treatment for MRSA infections.      Studies: Ct Chest Wo Contrast  01/22/2016  CLINICAL DATA:  Respiratory failure. Shortness of breath for 1 week. EXAM: CT CHEST WITHOUT CONTRAST TECHNIQUE: Multidetector CT imaging of the chest was performed following the standard protocol without IV contrast. COMPARISON:  Radiographs 01/20/2016. Most recent chest CT 06/08/2015 FINDINGS: Mediastinum/Lymph Nodes: Atherosclerotic calcification of the thoracic aorta. Coronary artery calcifications. Multi chamber cardiac enlargement. Multiple small mediastinal lymph nodes. Limited assessment for hilar adenopathy given lack contrast. No pericardial effusion Lungs/Pleura: Right upper lobe fibrosis with small fiducial markers again seen. Irregular density is increased in the interim currently measuring 2.3 x 1.6 cm, previously 1.3 x 0.7 cm. Additionally curvilinear opacity extending towards the apex has also increased Small to moderate bilateral pleural effusions and compressive atelectasis in both lower lobes, left greater than right. Fluid tracking in the fissures bilaterally. Tiny pleural-based nodule in the right upper lobe measures 5 mm, increase  conspicuity compared to prior, series 3, image 67. Left upper lobe opacity with air bronchogram and both confluent and ground-glass components image 67 series 3. Ground-glass component measures 1.6 x 1.6 cm. There is mild smooth septal thickening in the lower lobes. Trachea and bronchi are patent with minimal mucous in the trachea on the left. Upper abdomen: No acute abnormality. Cystic lesions in the spleen are again seen, grossly stable. Right adrenal adenoma is again seen. There is thinning of the renal parenchyma bilaterally with renal cysts, incompletely included. Musculoskeletal: No lytic or blastic osseous lesions. No acute abnormality. Minimal anterior wedging of the mid lower thoracic vertebra are unchanged. IMPRESSION: 1. Moderate size bilateral pleural effusions with compressive atelectasis, smooth septal thickening consistent pulmonary edema, and cardiomegaly. Suspect fluid overload/CHF. 2. Small focal left upper lobe opacity  with both confluent ground-glass opacities and internal air bronchogram, may be infectious, inflammatory, or less likely neoplastic. 3. Right apical scarring, patient with history of lung cancer post radiation. The soft tissue density adjacent to the fiducial markers is increased from prior chest CT of October 2016 and PET-CT of 2015. Additionally there is increased curvilinear density more superiorly extending towards the apex. Differential considerations include delayed postradiation change versus recurrent disease. Superimposed infection is considered but felt less likely. PET-CT may be helpful for evaluation of metabolic activity. 4. Follow-up of the left lung abnormality recommended with chest CT in 3 months, if PET-CT is not performed in the interim. Electronically Signed   By: Jeb Levering M.D.   On: 01/22/2016 00:48   US Renal  01/20/2016  CLINICAL DATA:  82 year old female with acute kidney failure. EXAM: RENAL / URINARY TRACT ULTRASOUND COMPLETE COMPARISON:   06/08/2015 renal ultrasound FINDINGS: Right Kidney: Length: 11 cm. Mildly increased renal echogenicity and mild renal cortical atrophy noted. Multiple renal cysts are noted, the largest measuring 2.9 cm in the mid-upper pole. There is no evidence of solid mass or hydronephrosis. Left Kidney: Length: 12 cm. Mildly increased renal echogenicity and mild renal cortical atrophy noted. Multiple renal cysts are again identified, the largest measuring 2 cm. There is no evidence of solid mass or hydronephrosis. Bladder: Appears normal for degree of bladder distention. IMPRESSION: Mild increased renal echogenicity and renal cortical atrophy likely representing medical renal disease. No evidence of hydronephrosis or solid mass. Multiple renal cysts again noted. Electronically Signed   By: Margarette Canada M.D.   On: 01/20/2016 17:10   Dg Chest Port 1 View  01/20/2016  CLINICAL DATA:  Shortness of breath for 1 week. History of lung cancer, COPD and congestive heart failure. EXAM: PORTABLE CHEST 1 VIEW COMPARISON:  01/19/2016 and 11/24/2015. FINDINGS: 1812 hours. There is stable cardiomegaly, vascular congestion and aortic atherosclerosis. There are stable postsurgical changes in the right suprahilar region with associated asymmetric density. There is stable blunting of both costophrenic angles. No definite superimposed airspace disease or significant pleural effusion. IMPRESSION: Stable cardiomegaly, vascular congestion and possible mild edema. No focal airspace disease. Electronically Signed   By: Richardean Sale M.D.   On: 01/20/2016 18:29    Scheduled Meds: . amiodarone  400 mg Oral Daily  . aspirin EC  81 mg Oral QPM  . atorvastatin  20 mg Oral QPM  . budesonide (PULMICORT) nebulizer solution  0.25 mg Nebulization BID  . cholecalciferol  800 Units Oral QPM  . clopidogrel  75 mg Oral QPM  . diltiazem  240 mg Oral QPM  . febuxostat  40 mg Oral QPM  . ferrous sulfate  325 mg Oral QPM  . furosemide  40 mg Intravenous  Once  . heparin  5,000 Units Subcutaneous Q8H  . insulin aspart  0-15 Units Subcutaneous TID WC  . insulin aspart  0-5 Units Subcutaneous QHS  . ipratropium  0.5 mg Nebulization Q6H  . levalbuterol  0.63 mg Nebulization Q6H  . [START ON 01/23/2016] meropenem (MERREM) IV  500 mg Intravenous Q24H  . metoprolol succinate  25 mg Oral QPM  . multivitamin-lutein  1 capsule Oral QPM  . pantoprazole  40 mg Oral QPM  . predniSONE  30 mg Oral Q breakfast  . sertraline  50 mg Oral QPM  . sodium chloride flush  3 mL Intravenous Q12H  . cyanocobalamin  500 mcg Oral QPM   Continuous Infusions: . amiodarone Stopped (01/22/16 1325)  Assessment/Plan:  1. Acute respiratory failure on chronic respiratory failure. Critical care specialist in the patient stable to be transferred out of the ICU. Continue oxygen supplementation and BiPAP at night 2. Acute diastolic congestive heart failure and Fluid overload. Patient also has bilateral pleural effusions. Give 1 dose of Lasix 40 mg IV 1 today 3. COPD exacerbation with poor air entry. Continue prednisone taper. Continue budesonide nebulizers to her DuoNeb. On Zithromax. 4. Acute kidney injury on chronic kidney disease. Creatinine back up today to 2.6. Watch closely with diuresis. 5. Hyperkalemia. Nephrology to consider new medication 6. Anemia of chronic disease 7. Anxiety depression continue psychiatric medications 8. Hyperlipidemia unspecified on atorvastatin 9. GERD on Protonix  Code Status:     Code Status Orders        Start     Ordered   01/19/16 1957  Limited resuscitation (code)   Continuous    Question Answer Comment  In the event of cardiac or respiratory ARREST: Initiate Code Blue, Call Rapid Response Yes   In the event of cardiac or respiratory ARREST: Perform CPR No   In the event of cardiac or respiratory ARREST: Perform Intubation/Mechanical Ventilation Yes   In the event of cardiac or respiratory ARREST: Use NIPPV/BiPAp only if  indicated Yes   In the event of cardiac or respiratory ARREST: Administer ACLS medications if indicated No   In the event of cardiac or respiratory ARREST: Perform Defibrillation or Cardioversion if indicated No      01/19/16 1956    Code Status History    Date Active Date Inactive Code Status Order ID Comments User Context   11/26/2015  4:02 AM 11/26/2015  8:59 PM Full Code [id number]  Silver Huguenin, RN Inpatient   06/14/2015  6:57 PM 06/15/2015  8:54 PM DNR [id number]  Colleen Can, MD Inpatient   06/07/2015 10:20 PM 06/14/2015  6:56 PM Full Code [id number]  Lytle Butte, MD ED   05/15/2015  3:27 PM 05/24/2015  5:04 PM Full Code [id number]  Gladstone Lighter, MD ED   05/08/2015 11:23 AM 05/11/2015  3:13 PM Full Code [id number]  Dustin Flock, MD ED   04/15/2015 12:01 AM 04/15/2015  7:03 PM Full Code [id number]  Lance Coon, MD Inpatient   12/23/2014  1:09 PM 12/24/2014  5:17 PM Full Code [id number]  Burnell Blanks, MD Inpatient   12/23/2014  4:22 AM 12/23/2014  1:09 PM Full Code [id number]  Alwyn Pea, MD ED    Advance Directive Documentation        Most Recent Value   Type of Advance Directive  Healthcare Power of Attorney, Living will   Pre-existing out of facility DNR order (yellow form or pink MOST form)     "MOST" Form in Place?       Family Communication: Spoke with son Aurther Loft Disposition Plan: TBD  Consultants:  Critical care specialist  Nephrology  Antibiotics:  Zithromax  Time spent: 28 minutes  [place], [place]

## 2016-01-22 NOTE — Progress Notes (Signed)
Called to patients room.  Pt anxious, and reporting "I can't breath."  Pt begging this RN to get Bipap.  RT called, who is bringing bipap.  Dr. Earleen Newport called.  Ordered second dose of Lasix and for patient to be placed back on Bipap.  Orders changed, pt no longer transfering to floor.

## 2016-01-22 NOTE — Progress Notes (Signed)
Pt reports shortness of breath/difficulty breathing.  Upon entering room pt sitting up talking copious amounts, and quickly to visitors at bedside.  o2 sat 93%.  Upon assessment, lungs clear in all fields.  Skin normochromic, no accessory muscle use noted.  Pt encouraged to stop talking and breath through nose.  Pt cooperated, and appears better.  Pt requesting meds for anxiety.  Given PRn Xanax, see MAR.  Will continue to monitor.

## 2016-01-22 NOTE — Progress Notes (Signed)
PATIENT ID: [id number] with diabetes mellitus type 2, coronary artery disease status post PCI/2016, hypertension, hyperlipidemia, chronic anemia, COPD, and paroxysmal atrial fibrillation here with acute on chronic diastolic heart failure, atrial fibrillation with rapid ventricular response, an acute on chronic renal failure.  INTERVAL HISTORY: Converted to sinus rhythm at 10 PM.  SUBJECTIVE:  Feels well. Breathing is still labored but much improved from yesterday. She denies any chest pain or palpitations.   PHYSICAL EXAM Filed Vitals:   01/22/16 0800 01/22/16 0900 01/22/16 1000 01/22/16 1100  BP: 137/60 145/67 133/97 143/55  Pulse: 76 78 73 74  Temp:      TempSrc:      Resp: '17 18 14 19  '$ Height:      Weight:      SpO2: 97% 96% 97% 96%   General:  Well-appearing.  Breathing mildly labored. No acute distress.  Frequent coughing that sounds wet but nonproductive. Neck: JVP 1 cm above the clavicle at 45. No carotid bruits. Lungs:  Diffuse mild expiratory wheezing. Diminished at the bases.  Mild bibasilar crackles. Heart:  Regular rate and rhythm.  No murmurs, rubs, or gallops. Normal S1/S2. Abdomen:  Soft. Nontender, nondistended. Active bowel sounds.  Extremities:  Warm and well-perfused. No edema.  LABS: Lab Results  Component Value Date   TROPONINI <0.03 01/22/2016   Results for orders placed or performed during the hospital encounter of 01/19/16 (from the past 24 hour(s))  Lactic acid, plasma     Status: None   Collection Time: 01/21/16 12:28 PM  Result Value Ref Range   Lactic Acid, Venous 1.6 0.5 - 2.0 mmol/L  Brain natriuretic peptide     Status: Abnormal   Collection Time: 01/21/16 12:28 PM  Result Value Ref Range   B Natriuretic Peptide 943.0 (H) 0.0 - 100.0 pg/mL  CK     Status: None   Collection Time: 01/21/16 12:28 PM  Result Value Ref Range   Total CK 85 38 - 234 U/L  Troponin I (q 6hr x 3)     Status: None   Collection Time: 01/21/16 12:28 PM  Result Value Ref  Range   Troponin I <0.03 <0.031 ng/mL  Procalcitonin - Baseline     Status: None   Collection Time: 01/21/16 12:28 PM  Result Value Ref Range   Procalcitonin <0.10 ng/mL  Blood gas, arterial     Status: Abnormal   Collection Time: 01/21/16  1:00 PM  Result Value Ref Range   FIO2 0.50    Mode BILEVEL POSITIVE AIRWAY PRESSURE    LHR 12 resp/min   Inspiratory PAP 18    Expiratory PAP 10    pH, Arterial 7.35 7.350 - 7.450   pCO2 arterial 52 (H) 32.0 - 48.0 mmHg   pO2, Arterial 111 (H) 83.0 - 108.0 mmHg   Bicarbonate 28.7 (H) 21.0 - 28.0 mEq/L   Acid-Base Excess 2.4 0.0 - 3.0 mmol/L   O2 Saturation 98.1 %   Patient temperature 37.0    Collection site RIGHT RADIAL    Sample type ARTERIAL DRAW    Allens test (pass/fail) POSITIVE (A) PASS  Lactic acid, plasma     Status: None   Collection Time: 01/21/16  3:30 PM  Result Value Ref Range   Lactic Acid, Venous 2.0 0.5 - 2.0 mmol/L  MRSA PCR Screening     Status: None   Collection Time: 01/21/16  3:45 PM  Result Value Ref Range   MRSA by PCR NEGATIVE NEGATIVE  Glucose, capillary  Status: Abnormal   Collection Time: 01/21/16  5:16 PM  Result Value Ref Range   Glucose-Capillary 153 (H) 65 - 99 mg/dL  Troponin I (q 6hr x 3)     Status: None   Collection Time: 01/21/16  7:05 PM  Result Value Ref Range   Troponin I <0.03 <0.031 ng/mL  Glucose, capillary     Status: Abnormal   Collection Time: 01/21/16  9:28 PM  Result Value Ref Range   Glucose-Capillary 155 (H) 65 - 99 mg/dL   Comment 1 Notify RN   Troponin I (q 6hr x 3)     Status: None   Collection Time: 01/22/16 12:10 AM  Result Value Ref Range   Troponin I <0.03 <0.031 ng/mL  Basic metabolic panel     Status: Abnormal   Collection Time: 01/22/16 12:10 AM  Result Value Ref Range   Sodium 138 135 - 145 mmol/L   Potassium 5.2 (H) 3.5 - 5.1 mmol/L   Chloride 100 (L) 101 - 111 mmol/L   CO2 30 22 - 32 mmol/L   Glucose, Bld 145 (H) 65 - 99 mg/dL   BUN 79 (H) 6 - 20 mg/dL    Creatinine, Ser 2.61 (H) 0.44 - 1.00 mg/dL   Calcium 8.5 (L) 8.9 - 10.3 mg/dL   GFR calc non Af Amer 16 (L) >60 mL/min   GFR calc Af Amer 19 (L) >60 mL/min   Anion gap 8 5 - 15  Procalcitonin     Status: None   Collection Time: 01/22/16 12:10 AM  Result Value Ref Range   Procalcitonin 0.43 ng/mL  CBC     Status: Abnormal   Collection Time: 01/22/16 12:10 AM  Result Value Ref Range   WBC 7.0 3.6 - 11.0 K/uL   RBC 3.10 (L) 3.80 - 5.20 MIL/uL   Hemoglobin 8.0 (L) 12.0 - 16.0 g/dL   HCT 24.6 (L) 35.0 - 47.0 %   MCV 79.4 (L) 80.0 - 100.0 fL   MCH 25.7 (L) 26.0 - 34.0 pg   MCHC 32.4 32.0 - 36.0 g/dL   RDW 16.8 (H) 11.5 - 14.5 %   Platelets 188 150 - 440 K/uL  Glucose, capillary     Status: Abnormal   Collection Time: 01/22/16  7:15 AM  Result Value Ref Range   Glucose-Capillary 153 (H) 65 - 99 mg/dL  Glucose, capillary     Status: Abnormal   Collection Time: 01/22/16 11:07 AM  Result Value Ref Range   Glucose-Capillary 205 (H) 65 - 99 mg/dL    Intake/Output Summary (Last 24 hours) at 01/22/16 1137 Last data filed at 01/22/16 0600  Gross per 24 hour  Intake  200.4 ml  Output   1575 ml  Net -1374.6 ml    Telemetry:  Atrial fibrillation in the 140s until 10:15 PM. Since then, sinus rhythm in the 80s.  ASSESSMENT AND PLAN:  Principal Problem:   Acute on chronic renal failure (HCC) Active Problems:   Hyperkalemia   Chronic obstructive pulmonary disease with acute exacerbation (HCC)   SOB (shortness of breath)   # Paroxysmal atrial fibrillation: Ms. Campoy converted to sinus rhythm overnight.  IV Amiodarone has been discontinued.  We will resume her home amiodarone dose of 400 mg daily. She has been deemed a poor anticoagulation candidate.  Given that her stent has been in place for over a year and she has been on aspirin and consider broke, could consider stopping either of those agents and starting a NOAC.  This patients CHA2DS2-VASc Score and unadjusted Ischemic Stroke Rate  (% per year) is equal to 9.7 % stroke rate/year from a score of 6  Above score calculated as 1 point each if present [CHF, HTN, DM, Vascular=MI/PAD/Aortic Plaque, Age if 65-74, or Female] Above score calculated as 2 points each if present [Age > 75, or Stroke/TIA/TE]   # Acute on chronic renal failure: Renal function worsened with IV lasix.  She is clearly volume overloaded based on pulmonary edema and pleural effusions on CT.  Agree with nephrology that we should hold both lasix and IV fluids.  Consider thoracentesis if respiratory status worsens.     # Acute on chronic diastolic heart failure: Ms. Gartland remains volume overloaded, but will hold on diuresis as above.     # CAD s/p PCI: Ms. Kroner had PCI 11/2014.  Continue aspirin and Plavix.  Given that it has been >1 year and she has chronic anemia, could consider switching to aspirin only or aspirin and a NOAC.  Will defer to her primary cardiologist, Dr. Rockey Situ.  Continue atorvastatin.    Pebble Botkin C. Oval Linsey, MD, [facility] 01/22/2016 11:37 AM

## 2016-01-22 NOTE — Progress Notes (Signed)
PULMONARY / CRITICAL CARE MEDICINE   Name: Regina Burke MRN: [id number] DOB: 12/22/1932    ADMISSION DATE:  01/19/2016  CHIEF COMPLAINT: Worsening respiratory status requiring bipap  HISTORY OF PRESENT ILLNESS:   82 yo F Asked medical history of COPD on 2 L of oxygen at home, diastolic CHF, hypertension, history of myocardial infraction, history of sleep apnea, Cushing disease, type 2 diabetes, migraines, history of lung cancer, chronic kidney disease, proximal atrial fibrillation, seen in consultation for acute on chronic shortness of breath in the setting of atrial fibrillation. Per family members and review of chart, she has been having shortness of breath with productive sputum and palpitations over the last week or so. She saw her primary care physician who started her on azithromycin, she started to feel better, but continued to have shortness of breath and palpitations. She was brought to the hospital due to having hyperkalemia and worsening hypoxia and her cats for follow-up. Upon arrival she was given medications that should for hyperkalemia, she was started on 3 L supplemental oxygen for desaturations/hypoxia. This morning she was noted to have limited IV access and eventually lost an IV, she was not tolerating by mouth well, she started to become anxious and more hypoxic requiring BiPAP and was transferred to the ICU for further evaluation and management.  Upon arrival to the ICU - patient noted to be somnolent, but easily awaken with verbal stimulation, placed on bipap and adjust from 15/6 to 18/10. RN tried to obtain PIV access, unsuccessful. MD placed R fem CVL. Within 1 hours patient more alert and interactive.   SUBJECTIVE:  No acute issues overnight. Reports significant improvement in dyspnea. SPO2 in the mid 90s on 4L [place]. Seh denies chest pain, palpitations, wheezing and dyspnea but reports a wet non-productive cough  VITAL SIGNS: BP 127/50 mmHg  Pulse 72  Temp(Src) 98.5  F (36.9 C) (Oral)  Resp 13  Ht '5\' 4"'$  (1.626 m)  Wt 183 lb 14.4 oz (83.416 kg)  BMI 31.55 kg/m2  SpO2 100%  HEMODYNAMICS:    VENTILATOR SETTINGS:    INTAKE / OUTPUT: I/O last 3 completed shifts: In: 1910.9 [I.V.:1910.9] Out: 1950 [Urine:1950]  PHYSICAL EXAMINATION: General: Awake, NAD HEENT: PERRLA, mild JVD, oral mucosa moist, trachea midline Neuro: AAO X4, no focal deficits Cardiovascular: Irregular, S1/S2, no murmur, regurg or gallop Lungs: Normal WOB, bilateral airflow, mild expiratory wheezes Abdomen: Soft, non-tender, non-distended, normal bowel sounds Musculoskeletal:  No joint deformities, +rom Extremities: +2 pulses, +1 edema Skin: Warm and dry  LABS:  BMET  Recent Labs Lab 01/20/16 0459 01/21/16 0910 01/22/16 0010  NA 138 138 138  K 4.9 4.4 5.2*  CL 100* 102 100*  CO2 '28 27 30  '$ BUN 84* 68* 79*  CREATININE 2.60* 1.96* 2.61*  GLUCOSE 277* 183* 145*    Electrolytes  Recent Labs Lab 01/20/16 0459 01/21/16 0910 01/22/16 0010  CALCIUM 8.1* 8.7* 8.5*    CBC  Recent Labs Lab 01/19/16 1358 01/19/16 1515 01/20/16 0459  WBC 6.1 5.5 3.6  HGB 10.4* 9.8* 8.2*  HCT 31.3* 29.6* 25.3*  PLT 239 218 175    Coag's No results for input(s): APTT, INR in the last 168 hours.  Sepsis Markers  Recent Labs Lab 01/19/16 1945 01/21/16 1228 01/21/16 1530 01/22/16 0010  LATICACIDVEN 1.2 1.6 2.0  --   PROCALCITON  --  <0.10  --  0.43    ABG  Recent Labs Lab 01/21/16 1300  PHART 7.35  PCO2ART 52*  PO2ART  111*    Liver Enzymes  Recent Labs Lab 01/19/16 1358 01/19/16 1515  AST 43* 41  ALT 48 46  ALKPHOS 48 41  BILITOT 0.7 0.5  ALBUMIN 4.6 4.3    Cardiac Enzymes  Recent Labs Lab 01/21/16 1228 01/21/16 1905 01/22/16 0010  TROPONINI <0.03 <0.03 <0.03    Glucose  Recent Labs Lab 01/20/16 1738 01/20/16 2039 01/21/16 0754 01/21/16 1132 01/21/16 1716 01/21/16 2128  GLUCAP 154* 125* 185* 225* 153* 155*    Imaging Ct  Chest Wo Contrast  01/22/2016  CLINICAL DATA:  Respiratory failure. Shortness of breath for 1 week. EXAM: CT CHEST WITHOUT CONTRAST TECHNIQUE: Multidetector CT imaging of the chest was performed following the standard protocol without IV contrast. COMPARISON:  Radiographs 01/20/2016. Most recent chest CT 06/08/2015 FINDINGS: Mediastinum/Lymph Nodes: Atherosclerotic calcification of the thoracic aorta. Coronary artery calcifications. Multi chamber cardiac enlargement. Multiple small mediastinal lymph nodes. Limited assessment for hilar adenopathy given lack contrast. No pericardial effusion Lungs/Pleura: Right upper lobe fibrosis with small fiducial markers again seen. Irregular density is increased in the interim currently measuring 2.3 x 1.6 cm, previously 1.3 x 0.7 cm. Additionally curvilinear opacity extending towards the apex has also increased Small to moderate bilateral pleural effusions and compressive atelectasis in both lower lobes, left greater than right. Fluid tracking in the fissures bilaterally. Tiny pleural-based nodule in the right upper lobe measures 5 mm, increase conspicuity compared to prior, series 3, image 67. Left upper lobe opacity with air bronchogram and both confluent and ground-glass components image 67 series 3. Ground-glass component measures 1.6 x 1.6 cm. There is mild smooth septal thickening in the lower lobes. Trachea and bronchi are patent with minimal mucous in the trachea on the left. Upper abdomen: No acute abnormality. Cystic lesions in the spleen are again seen, grossly stable. Right adrenal adenoma is again seen. There is thinning of the renal parenchyma bilaterally with renal cysts, incompletely included. Musculoskeletal: No lytic or blastic osseous lesions. No acute abnormality. Minimal anterior wedging of the mid lower thoracic vertebra are unchanged. IMPRESSION: 1. Moderate size bilateral pleural effusions with compressive atelectasis, smooth septal thickening consistent  pulmonary edema, and cardiomegaly. Suspect fluid overload/CHF. 2. Small focal left upper lobe opacity with both confluent ground-glass opacities and internal air bronchogram, may be infectious, inflammatory, or less likely neoplastic. 3. Right apical scarring, patient with history of lung cancer post radiation. The soft tissue density adjacent to the fiducial markers is increased from prior chest CT of October 2016 and PET-CT of 2015. Additionally there is increased curvilinear density more superiorly extending towards the apex. Differential considerations include delayed postradiation change versus recurrent disease. Superimposed infection is considered but felt less likely. PET-CT may be helpful for evaluation of metabolic activity. 4. Follow-up of the left lung abnormality recommended with chest CT in 3 months, if PET-CT is not performed in the interim. Electronically Signed   By: Jeb Levering M.D.   On: 01/22/2016 00:48    STUDIES:  None  CULTURES: Blood cultures X2> Urine culture> Sputum culture>  ANTIBIOTICS: Antibiotics: Meropenem 5/26> Azithromycin 5/25>  SIGNIFICANT EVENTS: 05/24: ED with dyspnea and hypoxia, admitted with dehydration, hyperkalemia and afib with RVR  LINES/TUBES: R fem CVL 5/26>  DISCUSSION: 82 year old female asked shows COPD on 2 L of oxygen, diastolic heart failure, proximal atrophy relation, transferred to the ICU due to worsening atrial fibrillation with hypoxia and worsening dyspnea.  ASSESSMENT / PLAN:  PULMONARY A: Acute on chronic hypoxic respiratory failure AECOPD COPD Dyspnea Hx  of Stage 1 Lung Ca - s/p radiation, NSCLC Hx of Pleural effusion OSA Respiratory Acidosis P:   -Off continuous BiPAP -Continue Bipap at night.  -Gentle diuresis -Continue bronchodilators (pulmicort, levalbuterol, atrovent) and oral steroids -Continue broad-spectrum antibiotics- meropenem -Maintain O2 saturation 88-92% -Supplemental oxygen  [place]  CARDIOVASCULAR A:  Afib with RVR CAD s/p stents Acute CHF exacerbation-BNP 943 Hypertension P:  - Cardiology following - amio gtt started - Hemodynamics per ICU protocol - f/u CE - Gentle diuresis - Not a good candidate for anticoagulation per cardiology - cont with metoprolol - PRN labetalol for sbp > 140, hold for HR <70  RENAL A:   CKD-Creatinine trending down:2.80>2.61 P:   -Monitor Is/Os -Trend creatinine -avoid nephro toxic drugs.   HEMATOLOGIC Anemia Hx of NSCLC Stage I - s/p radiation P:  -f\u CBC -F/U with oncology as outpatient   INFECTIOUS Recent bronchitis P:  - Continue meropenem  - PCT upto 0.43 from <0.1  ENDOCRINE Hx of Cushings P:  -followed by Endo as an outpatient.  -currently on prednisone for suspected AECOPD  PSYCH Anxiety P:  -cont with alprazolam  Disposition and family update: No family at bedside. Patient updated on current treatment plan. Transfer to telemetry if ok with cardiology. Dr. Estanislado Pandy Notified  Best Practice: Code Status:  Full. Diet: Heart Healthy / Carb Mod. GI prophylaxis:  PPI. VTE prophylaxis:  SCD's / heparin.  Total CCM time is 35 minutes  Sunil Hue S. [facility] ANP-BC Pulmonary and Critical Care Medicine [facility] Pager [phone] or [phone]   01/22/2016, 5:43 AM

## 2016-01-23 ENCOUNTER — Inpatient Hospital Stay: Payer: Medicare Other

## 2016-01-23 LAB — BASIC METABOLIC PANEL WITH GFR
Anion gap: 10 (ref 5–15)
BUN: 82 mg/dL — ABNORMAL HIGH (ref 6–20)
CO2: 34 mmol/L — ABNORMAL HIGH (ref 22–32)
Calcium: 8.6 mg/dL — ABNORMAL LOW (ref 8.9–10.3)
Chloride: 94 mmol/L — ABNORMAL LOW (ref 101–111)
Creatinine, Ser: 2.48 mg/dL — ABNORMAL HIGH (ref 0.44–1.00)
GFR calc Af Amer: 20 mL/min — ABNORMAL LOW
GFR calc non Af Amer: 17 mL/min — ABNORMAL LOW
Glucose, Bld: 112 mg/dL — ABNORMAL HIGH (ref 65–99)
Potassium: 4.2 mmol/L (ref 3.5–5.1)
Sodium: 138 mmol/L (ref 135–145)

## 2016-01-23 LAB — GLUCOSE, CAPILLARY
GLUCOSE-CAPILLARY: 127 mg/dL — AB (ref 65–99)
Glucose-Capillary: 121 mg/dL — ABNORMAL HIGH (ref 65–99)
Glucose-Capillary: 208 mg/dL — ABNORMAL HIGH (ref 65–99)
Glucose-Capillary: 154 mg/dL — ABNORMAL HIGH (ref 65–99)

## 2016-01-23 LAB — PROCALCITONIN: Procalcitonin: 0.43 ng/mL

## 2016-01-23 LAB — MAGNESIUM: Magnesium: 2 mg/dL (ref 1.7–2.4)

## 2016-01-23 MED ORDER — AMIODARONE HCL IN DEXTROSE 360-4.14 MG/200ML-% IV SOLN
60.0000 mg/h | INTRAVENOUS | Status: AC
  Administered 2016-01-23 (×2): 60 mg/h via INTRAVENOUS
  Filled 2016-01-23 (×2): qty 200

## 2016-01-23 MED ORDER — CETYLPYRIDINIUM CHLORIDE 0.05 % MT LIQD
7.0000 mL | Freq: Two times a day (BID) | OROMUCOSAL | Status: DC
  Administered 2016-01-23 – 2016-01-27 (×6): 7 mL via OROMUCOSAL

## 2016-01-23 MED ORDER — FUROSEMIDE 10 MG/ML IJ SOLN
60.0000 mg | Freq: Once | INTRAMUSCULAR | Status: AC
  Filled 2016-01-23: qty 6

## 2016-01-23 MED ORDER — AMIODARONE HCL IN DEXTROSE 360-4.14 MG/200ML-% IV SOLN
30.0000 mg/h | INTRAVENOUS | Status: DC
  Administered 2016-01-23 – 2016-01-24 (×2): 30 mg/h via INTRAVENOUS
  Administered 2016-01-24 – 2016-01-25 (×3): 60 mg/h via INTRAVENOUS
  Administered 2016-01-26: 30 mg/h via INTRAVENOUS
  Administered 2016-01-26 (×2): 60 mg/h via INTRAVENOUS
  Administered 2016-01-27: 30 mg/h via INTRAVENOUS
  Filled 2016-01-23 (×24): qty 200

## 2016-01-23 MED ORDER — CHLORHEXIDINE GLUCONATE 0.12 % MT SOLN
15.0000 mL | Freq: Two times a day (BID) | OROMUCOSAL | Status: DC
  Administered 2016-01-23 – 2016-01-26 (×8): 15 mL via OROMUCOSAL
  Filled 2016-01-23 (×8): qty 15

## 2016-01-23 MED ORDER — AMIODARONE LOAD VIA INFUSION
150.0000 mg | Freq: Once | INTRAVENOUS | Status: AC
  Administered 2016-01-23: 150 mg via INTRAVENOUS
  Filled 2016-01-23: qty 83.34

## 2016-01-23 NOTE — Progress Notes (Signed)
tx from [facility]. Pt reports no pain. amio gtt @ 33.3. A fib. Room air. A & O. Pt has no further concerns at this time.

## 2016-01-23 NOTE — Progress Notes (Signed)
Pharmacy Antibiotic Note  Regina Burke is a 82 y.o. female admitted on 01/19/2016 with acute respiratory failure due to CHF/AECOPD on azithromycin PTA. CCM wants to broaden antibiotics as patient with bronchitis in outpatient setting on abx.  Pharmacy has been consulted for meropenem dosing. Patient also on azithromycin.   Plan: Will continue  Meropenem 500 mg iv q 24 hours since patient is in AKI with Serum creatinine bump of >0.3 mg/dl; therefore will dose as if patient's estimated CrCl is 10 ml/hr.  Height: '5\' 4"'$  (162.6 cm) Weight: 183 lb 14.4 oz (83.416 kg) IBW/kg (Calculated) : 54.7  Temp (24hrs), Avg:98.3 F (36.8 C), Min:97.8 F (36.6 C), Max:98.8 F (37.1 C)   Recent Labs Lab 01/19/16 1358 01/19/16 1515 01/19/16 1642 01/19/16 1945 01/20/16 0459 01/21/16 0910 01/21/16 1228 01/21/16 1530 01/22/16 0010 01/23/16 0509  WBC 6.1 5.5  --   --  3.6  --   --   --  7.0  --   CREATININE 2.80* 2.65*  --   --  2.60* 1.96*  --   --  2.61* 2.48*  LATICACIDVEN  --   --  0.6 1.2  --   --  1.6 2.0  --   --     Estimated Creatinine Clearance: 18.9 mL/min (by C-G formula based on Cr of 2.48).    Allergies  Allergen Reactions  . Ciprofloxacin Shortness Of Breath, Itching and Rash  . Doxycycline Shortness Of Breath, Itching and Rash  . Penicillins Shortness Of Breath, Itching, Rash and Other (See Comments)    Has patient had a PCN reaction causing immediate rash, facial/tongue/throat swelling, SOB or lightheadedness with hypotension: Yes Has patient had a PCN reaction causing severe rash involving mucus membranes or skin necrosis: No Has patient had a PCN reaction that required hospitalization No Has patient had a PCN reaction occurring within the last 10 years: No If all of the above answers are "NO", then may proceed with Cephalosporin use.  . Sulfa Antibiotics Shortness Of Breath, Itching and Rash  . Morphine And Related Itching  . Cefuroxime Rash    blisters     Antimicrobials this admission: Azithromycin  5/24 >>  Meropenem  5/26 >>   Dose adjustments this admission: Merrem 500 mg IV q24 hours.   Microbiology results: 5/24 BCx: NGTD x 2  Thank you for allowing pharmacy to be a part of this patient's care.  Pavel Gadd D 01/23/2016 10:46 AM

## 2016-01-23 NOTE — Progress Notes (Signed)
[facility]  ROUNDING NOTE   Subjective:  Renal function slightly better as creatinine down to 2.4. BUN high at 82 but patient is on steroids. Patient overall feeling somewhat better today. Potassium down to 4.2.  Objective:  Vital signs in last 24 hours:  Temp:  [97.8 F (36.6 C)-98.8 F (37.1 C)] 97.8 F (36.6 C) (05/28 0200) Pulse Rate:  [67-94] 69 (05/28 0600) Resp:  [14-28] 15 (05/28 0600) BP: (98-175)/(48-88) 101/56 mmHg (05/28 0600) SpO2:  [88 %-100 %] 99 % (05/28 0600) FiO2 (%):  [40 %-50 %] 40 % (05/28 0718)  Weight change:  Filed Weights   01/19/16 1507 01/19/16 2111  Weight: 83.008 kg (183 lb) 83.416 kg (183 lb 14.4 oz)    Intake/Output: I/O last 3 completed shifts: In: 213.7 [I.V.:213.7] Out: 3350 [Urine:3350]   Intake/Output this shift:     Physical Exam: General: No acute distress   Head: Normocephalic, atraumatic. Moist oral mucosal membranes  Eyes: Anicteric  Neck: Supple, trachea midline  Lungs:  Diminished breath sounds at the bases, normal effort   Heart: Irregular, no rubs  Abdomen:  Soft, nontender, BS present  Extremities: trace peripheral edema.  Neurologic: Nonfocal, moving all four extremities  Skin: No lesions       Basic Metabolic Panel:  Recent Labs Lab 01/19/16 1515 01/19/16 2155 01/20/16 0459 01/21/16 0910 01/22/16 0010 01/23/16 0509  NA 136  --  138 138 138 138  K 6.0* 5.0 4.9 4.4 5.2* 4.2  CL 96*  --  100* 102 100* 94*  CO2 30  --  _0 34*  GLUCOSE 92  --  277* 183* 145* 112*  BUN 91*  --  84* 68* 79* 82*  CREATININE 2.65*  --  2.60* 1.96* 2.61* 2.48*  CALCIUM 9.2  --  8.1* 8.7* 8.5* 8.6*  MG  --   --   --   --   --  2.0    Liver Function Tests:  Recent Labs Lab 01/19/16 1358 01/19/16 1515  AST 43* 41  ALT 48 46  ALKPHOS 48 41  BILITOT 0.7 0.5  PROT 7.9 7.3  ALBUMIN 4.6 4.3   No results for input(s): LIPASE, AMYLASE in the last 168 hours. No results for input(s): AMMONIA in the last  168 hours.  CBC:  Recent Labs Lab 01/19/16 1358 01/19/16 1515 01/20/16 0459 01/22/16 0010  WBC 6.1 5.5 3.6 7.0  NEUTROABS 4.8 4.2  --   --   HGB 10.4* 9.8* 8.2* 8.0*  HCT 31.3* 29.6* 25.3* 24.6*  MCV 77.7* 77.2* 79.2* 79.4*  PLT 239 218 175 188    Cardiac Enzymes:  Recent Labs Lab 01/19/16 1515 01/21/16 1228 01/21/16 1905 01/22/16 0010  CKTOTAL  --  85  --   --   TROPONINI <0.03 <0.03 <0.03 <0.03    BNP: Invalid input(s): POCBNP  CBG:  Recent Labs Lab 01/22/16 1107 01/22/16 1657 01/22/16 2119 01/23/16 0710 01/23/16 1100  GLUCAP 205* 158* 153* 121* 208*    Microbiology: Results for orders placed or performed during the hospital encounter of 01/19/16  Blood culture (routine x 2)     Status: None (Preliminary result)   Collection Time: 01/19/16  3:15 PM  Result Value Ref Range Status   Specimen Description BLOOD RIGHT WRIST  Final   Special Requests BOTTLES DRAWN AEROBIC AND ANAEROBIC 8CC  Final   Culture NO GROWTH 4 DAYS  Final   Report Status PENDING  Incomplete  Blood culture (routine x  2)     Status: None (Preliminary result)   Collection Time: 01/19/16  4:42 PM  Result Value Ref Range Status   Specimen Description BLOOD LEFT HAND  Final   Special Requests BOTTLES DRAWN AEROBIC AND ANAEROBIC [place]  Final   Culture NO GROWTH 4 DAYS  Final   Report Status PENDING  Incomplete  MRSA PCR Screening     Status: None   Collection Time: 01/21/16  3:45 PM  Result Value Ref Range Status   MRSA by PCR NEGATIVE NEGATIVE Final    Comment:        The GeneXpert MRSA Assay (FDA approved for NASAL specimens only), is one component of a comprehensive MRSA colonization surveillance program. It is not intended to diagnose MRSA infection nor to guide or monitor treatment for MRSA infections.     Coagulation Studies: No results for input(s): LABPROT, INR in the last 72 hours.  Urinalysis: No results for input(s): COLORURINE, LABSPEC, PHURINE,  GLUCOSEU, HGBUR, BILIRUBINUR, KETONESUR, PROTEINUR, UROBILINOGEN, NITRITE, LEUKOCYTESUR in the last 72 hours.  Invalid input(s): APPERANCEUR    Imaging: Ct Chest Wo Contrast  01/22/2016  CLINICAL DATA:  Respiratory failure. Shortness of breath for 1 week. EXAM: CT CHEST WITHOUT CONTRAST TECHNIQUE: Multidetector CT imaging of the chest was performed following the standard protocol without IV contrast. COMPARISON:  Radiographs 01/20/2016. Most recent chest CT 06/08/2015 FINDINGS: Mediastinum/Lymph Nodes: Atherosclerotic calcification of the thoracic aorta. Coronary artery calcifications. Multi chamber cardiac enlargement. Multiple small mediastinal lymph nodes. Limited assessment for hilar adenopathy given lack contrast. No pericardial effusion Lungs/Pleura: Right upper lobe fibrosis with small fiducial markers again seen. Irregular density is increased in the interim currently measuring 2.3 x 1.6 cm, previously 1.3 x 0.7 cm. Additionally curvilinear opacity extending towards the apex has also increased Small to moderate bilateral pleural effusions and compressive atelectasis in both lower lobes, left greater than right. Fluid tracking in the fissures bilaterally. Tiny pleural-based nodule in the right upper lobe measures 5 mm, increase conspicuity compared to prior, series 3, image 67. Left upper lobe opacity with air bronchogram and both confluent and ground-glass components image 67 series 3. Ground-glass component measures 1.6 x 1.6 cm. There is mild smooth septal thickening in the lower lobes. Trachea and bronchi are patent with minimal mucous in the trachea on the left. Upper abdomen: No acute abnormality. Cystic lesions in the spleen are again seen, grossly stable. Right adrenal adenoma is again seen. There is thinning of the renal parenchyma bilaterally with renal cysts, incompletely included. Musculoskeletal: No lytic or blastic osseous lesions. No acute abnormality. Minimal anterior wedging of the mid  lower thoracic vertebra are unchanged. IMPRESSION: 1. Moderate size bilateral pleural effusions with compressive atelectasis, smooth septal thickening consistent pulmonary edema, and cardiomegaly. Suspect fluid overload/CHF. 2. Small focal left upper lobe opacity with both confluent ground-glass opacities and internal air bronchogram, may be infectious, inflammatory, or less likely neoplastic. 3. Right apical scarring, patient with history of lung cancer post radiation. The soft tissue density adjacent to the fiducial markers is increased from prior chest CT of October 2016 and PET-CT of 2015. Additionally there is increased curvilinear density more superiorly extending towards the apex. Differential considerations include delayed postradiation change versus recurrent disease. Superimposed infection is considered but felt less likely. PET-CT may be helpful for evaluation of metabolic activity. 4. Follow-up of the left lung abnormality recommended with chest CT in 3 months, if PET-CT is not performed in the interim. Electronically Signed   By: Threasa Beards  Ehinger M.D.   On: 01/22/2016 00:48   Dg Chest Port 1 View  01/23/2016  CLINICAL DATA:  Patient with respiratory failure, CHF and COPD. EXAM: PORTABLE CHEST 1 VIEW COMPARISON:  Chest CT 01/21/2016 FINDINGS: Monitoring leads overlie the patient. Stable enlarged cardiac and mediastinal contours. Persistent small partially layering bilateral pleural effusions with underlying basilar opacities favored to represent atelectasis. Unchanged focal consolidation within the right suprahilar location. No pneumothorax. IMPRESSION: Stable cardiomegaly. Layering small bilateral pleural effusions and underlying atelectasis. Persistent right upper lobe consolidation. Electronically Signed   By: Lovey Newcomer M.D.   On: 01/23/2016 09:20     Medications:     . amiodarone  400 mg Oral Daily  . antiseptic oral rinse  7 mL Mouth Rinse q12n4p  . aspirin EC  81 mg Oral QPM  .  atorvastatin  20 mg Oral QPM  . budesonide (PULMICORT) nebulizer solution  0.25 mg Nebulization BID  . chlorhexidine  15 mL Mouth Rinse BID  . cholecalciferol  800 Units Oral QPM  . clopidogrel  75 mg Oral QPM  . diltiazem  240 mg Oral QPM  . febuxostat  40 mg Oral QPM  . ferrous sulfate  325 mg Oral QPM  . heparin  5,000 Units Subcutaneous Q8H  . insulin aspart  0-15 Units Subcutaneous TID WC  . insulin aspart  0-5 Units Subcutaneous QHS  . meropenem (MERREM) IV  500 mg Intravenous Q24H  . metoprolol succinate  25 mg Oral QPM  . multivitamin-lutein  1 capsule Oral QPM  . pantoprazole  40 mg Oral QPM  . predniSONE  30 mg Oral Q breakfast  . sertraline  50 mg Oral QPM  . sodium chloride flush  3 mL Intravenous Q12H  . cyanocobalamin  500 mcg Oral QPM   ALPRAZolam, labetalol, nitroGLYCERIN, ondansetron  Assessment/ Plan:  82 y.o. female with diabetes mellitus type II, coronary artery disease, hypertension, hyperlipidemia, generalized anxiety disorder, gout, anemia, history of lung cancer, COPD, atrial fibrillation, fatty liver disease, GERD, osteoporosis   1. Acute renal failure/Chronic kidney disease stage III with hyperkalemia and proteinuria: Endocrinology has ruled out hypoaldosteronism and believe her hyperkalemia is due to her renal failure as an outpt.  Acute renal failure likely due to poor PO intake. Baseline EGFR 32. - Creatinine slightly better this a.m. however BUN higher at 82. BUN likely high secondary to steroid use. Continue to monitor renal function parameters. Excellent urine output noted.  2. Hypertension: Blood pressure currently 101/56. Continue diltiazem and metoprolol.  3. Anemia with chronic kidney disease: Last hemoglobin was 8.0. As before she may require Epogen or Aranesp as an outpatient. Continue to monitor CBC.   LOS: [place], Marye Eagen 5/28/201711:45 AM

## 2016-01-23 NOTE — Progress Notes (Signed)
Patient ID: Regina Burke, female   DOB: [birth year], 82 y.o.   MRN: [id number] [facility] PROGRESS NOTE  Regina Burke:[id number] DOB: [birth year] DOA: 01/19/2016 PCP: Tracie Harrier, MD  HPI/Subjective: Patient feeling better than yesterday. Breathing a little bit more comfortably. Still with cough and shortness of breath.  Objective: Filed Vitals:   01/23/16 0500 01/23/16 0600  BP: 119/54 101/56  Pulse: 72 69  Temp:    Resp: 15 15    Filed Weights   01/19/16 1507 01/19/16 2111  Weight: 83.008 kg (183 lb) 83.416 kg (183 lb 14.4 oz)    ROS: Review of Systems  Constitutional: Negative for fever and chills.  Eyes: Negative for blurred vision.  Respiratory: Positive for cough and shortness of breath.   Cardiovascular: Negative for chest pain.  Gastrointestinal: Negative for nausea, vomiting, abdominal pain, diarrhea and constipation.  Genitourinary: Negative for dysuria.  Musculoskeletal: Negative for joint pain.  Neurological: Negative for dizziness and headaches.   Exam: Physical Exam  Constitutional: She is oriented to person, place, and time.  HENT:  Nose: No mucosal edema.  Mouth/Throat: No oropharyngeal exudate or posterior oropharyngeal edema.  Eyes: Conjunctivae, EOM and lids are normal. Pupils are equal, round, and reactive to light.  Neck: No JVD present. Carotid bruit is not present. No edema present. No thyroid mass and no thyromegaly present.  Cardiovascular: S1 normal and S2 normal.  Tachycardia present.  Exam reveals no gallop.   No murmur heard. Pulses:      Dorsalis pedis pulses are 2+ on the right side, and 2+ on the left side.  Respiratory: No accessory muscle usage. No respiratory distress. She has decreased breath sounds in the right lower field and the left lower field. She has no wheezes. She has no rhonchi. She has rales in the right lower field and the left lower field.  GI: Soft. Bowel sounds are normal. There is no tenderness.   Musculoskeletal:       Right ankle: She exhibits swelling.       Left ankle: She exhibits swelling.  Lymphadenopathy:    She has no cervical adenopathy.  Neurological: She is alert and oriented to person, place, and time. No cranial nerve deficit.  Skin: Skin is warm. No rash noted. Nails show no clubbing.  Psychiatric: She has a normal mood and affect.      Data Reviewed: Basic Metabolic Panel:  Recent Labs Lab 01/19/16 1515 01/19/16 2155 01/20/16 0459 01/21/16 0910 01/22/16 0010 01/23/16 0509  NA 136  --  138 138 138 138  K 6.0* 5.0 4.9 4.4 5.2* 4.2  CL 96*  --  100* 102 100* 94*  CO2 30  --  '28 27 30 '$ 34*  GLUCOSE 92  --  277* 183* 145* 112*  BUN 91*  --  84* 68* 79* 82*  CREATININE 2.65*  --  2.60* 1.96* 2.61* 2.48*  CALCIUM 9.2  --  8.1* 8.7* 8.5* 8.6*  MG  --   --   --   --   --  2.0   Liver Function Tests:  Recent Labs Lab 01/19/16 1358 01/19/16 1515  AST 43* 41  ALT 48 46  ALKPHOS 48 41  BILITOT 0.7 0.5  PROT 7.9 7.3  ALBUMIN 4.6 4.3   CBC:  Recent Labs Lab 01/19/16 1358 01/19/16 1515 01/20/16 0459 01/22/16 0010  WBC 6.1 5.5 3.6 7.0  NEUTROABS 4.8 4.2  --   --   HGB 10.4* 9.8* 8.2* 8.0*  HCT  31.3* 29.6* 25.3* 24.6*  MCV 77.7* 77.2* 79.2* 79.4*  PLT 239 218 175 188   Cardiac Enzymes:  Recent Labs Lab 01/19/16 1515 01/21/16 1228 01/21/16 1905 01/22/16 0010  CKTOTAL  --  85  --   --   TROPONINI <0.03 <0.03 <0.03 <0.03   BNP (last 3 results)  Recent Labs  11/24/15 2223 01/19/16 1515 01/21/16 1228  BNP 300.0* 330.0* 943.0*     CBG:  Recent Labs Lab 01/22/16 1107 01/22/16 1657 01/22/16 2119 01/23/16 0710 01/23/16 1100  GLUCAP 205* 158* 153* 121* 208*    Recent Results (from the past 240 hour(s))  Blood culture (routine x 2)     Status: None (Preliminary result)   Collection Time: 01/19/16  3:15 PM  Result Value Ref Range Status   Specimen Description BLOOD RIGHT WRIST  Final   Special Requests BOTTLES DRAWN AEROBIC  AND ANAEROBIC 8CC  Final   Culture NO GROWTH 4 DAYS  Final   Report Status PENDING  Incomplete  Blood culture (routine x 2)     Status: None (Preliminary result)   Collection Time: 01/19/16  4:42 PM  Result Value Ref Range Status   Specimen Description BLOOD LEFT HAND  Final   Special Requests BOTTLES DRAWN AEROBIC AND ANAEROBIC [place]  Final   Culture NO GROWTH 4 DAYS  Final   Report Status PENDING  Incomplete  MRSA PCR Screening     Status: None   Collection Time: 01/21/16  3:45 PM  Result Value Ref Range Status   MRSA by PCR NEGATIVE NEGATIVE Final    Comment:        The GeneXpert MRSA Assay (FDA approved for NASAL specimens only), is one component of a comprehensive MRSA colonization surveillance program. It is not intended to diagnose MRSA infection nor to guide or monitor treatment for MRSA infections.      Studies: Ct Chest Wo Contrast  01/22/2016  CLINICAL DATA:  Respiratory failure. Shortness of breath for 1 week. EXAM: CT CHEST WITHOUT CONTRAST TECHNIQUE: Multidetector CT imaging of the chest was performed following the standard protocol without IV contrast. COMPARISON:  Radiographs 01/20/2016. Most recent chest CT 06/08/2015 FINDINGS: Mediastinum/Lymph Nodes: Atherosclerotic calcification of the thoracic aorta. Coronary artery calcifications. Multi chamber cardiac enlargement. Multiple small mediastinal lymph nodes. Limited assessment for hilar adenopathy given lack contrast. No pericardial effusion Lungs/Pleura: Right upper lobe fibrosis with small fiducial markers again seen. Irregular density is increased in the interim currently measuring 2.3 x 1.6 cm, previously 1.3 x 0.7 cm. Additionally curvilinear opacity extending towards the apex has also increased Small to moderate bilateral pleural effusions and compressive atelectasis in both lower lobes, left greater than right. Fluid tracking in the fissures bilaterally. Tiny pleural-based nodule in the right upper lobe  measures 5 mm, increase conspicuity compared to prior, series 3, image 67. Left upper lobe opacity with air bronchogram and both confluent and ground-glass components image 67 series 3. Ground-glass component measures 1.6 x 1.6 cm. There is mild smooth septal thickening in the lower lobes. Trachea and bronchi are patent with minimal mucous in the trachea on the left. Upper abdomen: No acute abnormality. Cystic lesions in the spleen are again seen, grossly stable. Right adrenal adenoma is again seen. There is thinning of the renal parenchyma bilaterally with renal cysts, incompletely included. Musculoskeletal: No lytic or blastic osseous lesions. No acute abnormality. Minimal anterior wedging of the mid lower thoracic vertebra are unchanged. IMPRESSION: 1. Moderate size bilateral pleural effusions with compressive atelectasis,  smooth septal thickening consistent pulmonary edema, and cardiomegaly. Suspect fluid overload/CHF. 2. Small focal left upper lobe opacity with both confluent ground-glass opacities and internal air bronchogram, may be infectious, inflammatory, or less likely neoplastic. 3. Right apical scarring, patient with history of lung cancer post radiation. The soft tissue density adjacent to the fiducial markers is increased from prior chest CT of October 2016 and PET-CT of 2015. Additionally there is increased curvilinear density more superiorly extending towards the apex. Differential considerations include delayed postradiation change versus recurrent disease. Superimposed infection is considered but felt less likely. PET-CT may be helpful for evaluation of metabolic activity. 4. Follow-up of the left lung abnormality recommended with chest CT in 3 months, if PET-CT is not performed in the interim. Electronically Signed   By: Jeb Levering M.D.   On: 01/22/2016 00:48   Dg Chest Port 1 View  01/23/2016  CLINICAL DATA:  Patient with respiratory failure, CHF and COPD. EXAM: PORTABLE CHEST 1 VIEW  COMPARISON:  Chest CT 01/21/2016 FINDINGS: Monitoring leads overlie the patient. Stable enlarged cardiac and mediastinal contours. Persistent small partially layering bilateral pleural effusions with underlying basilar opacities favored to represent atelectasis. Unchanged focal consolidation within the right suprahilar location. No pneumothorax. IMPRESSION: Stable cardiomegaly. Layering small bilateral pleural effusions and underlying atelectasis. Persistent right upper lobe consolidation. Electronically Signed   By: Lovey Newcomer M.D.   On: 01/23/2016 09:20    Scheduled Meds: . antiseptic oral rinse  7 mL Mouth Rinse q12n4p  . aspirin EC  81 mg Oral QPM  . atorvastatin  20 mg Oral QPM  . budesonide (PULMICORT) nebulizer solution  0.25 mg Nebulization BID  . chlorhexidine  15 mL Mouth Rinse BID  . cholecalciferol  800 Units Oral QPM  . clopidogrel  75 mg Oral QPM  . diltiazem  240 mg Oral QPM  . febuxostat  40 mg Oral QPM  . ferrous sulfate  325 mg Oral QPM  . heparin  5,000 Units Subcutaneous Q8H  . insulin aspart  0-15 Units Subcutaneous TID WC  . insulin aspart  0-5 Units Subcutaneous QHS  . meropenem (MERREM) IV  500 mg Intravenous Q24H  . metoprolol succinate  25 mg Oral QPM  . multivitamin-lutein  1 capsule Oral QPM  . pantoprazole  40 mg Oral QPM  . predniSONE  30 mg Oral Q breakfast  . sertraline  50 mg Oral QPM  . sodium chloride flush  3 mL Intravenous Q12H  . cyanocobalamin  500 mcg Oral QPM   Continuous Infusions: . amiodarone 60 mg/hr (01/23/16 1427)   Followed by  . amiodarone      Assessment/Plan:  1. Acute respiratory failure on chronic respiratory failure. Now on nasal cannula. Continue oxygen supplementation and BiPAP at night. Patient empirically on meropenem. Chest x-ray today showed right upper lobe consolidation which is likely scar tissue from the CAT scan. This is her site of prior lung cancer. 2. Acute diastolic congestive heart failure and Fluid overload.  Patient also has bilateral pleural effusions. Give 1 dose of Lasix 60 mg IV 1 today. 3. COPD exacerbation with poor air entry. Continue prednisone taper. Continue budesonide nebulizers to her DuoNeb. On Zithromax. 4. Acute kidney injury on chronic kidney disease. Creatinine back up today to 2.48. Watch closely with diuresis. 5. Hyperkalemia. Nephrology to consider new medication 6. Anemia of chronic disease 7. Anxiety depression continue psychiatric medications 8. Hyperlipidemia unspecified on atorvastatin 9. GERD on Protonix  Code Status:     Code  Status Orders        Start     Ordered   01/19/16 1957  Limited resuscitation (code)   Continuous    Question Answer Comment  In the event of cardiac or respiratory ARREST: Initiate Code Blue, Call Rapid Response Yes   In the event of cardiac or respiratory ARREST: Perform CPR No   In the event of cardiac or respiratory ARREST: Perform Intubation/Mechanical Ventilation Yes   In the event of cardiac or respiratory ARREST: Use NIPPV/BiPAp only if indicated Yes   In the event of cardiac or respiratory ARREST: Administer ACLS medications if indicated No   In the event of cardiac or respiratory ARREST: Perform Defibrillation or Cardioversion if indicated No      01/19/16 1956    Code Status History    Date Active Date Inactive Code Status Order ID Comments User Context   11/26/2015  4:02 AM 11/26/2015  8:59 PM Full Code [id number]  Silver Huguenin, RN Inpatient   06/14/2015  6:57 PM 06/15/2015  8:54 PM DNR [id number]  Colleen Can, MD Inpatient   06/07/2015 10:20 PM 06/14/2015  6:56 PM Full Code [id number]  Lytle Butte, MD ED   05/15/2015  3:27 PM 05/24/2015  5:04 PM Full Code [id number]  Gladstone Lighter, MD ED   05/08/2015 11:23 AM 05/11/2015  3:13 PM Full Code [id number]  Dustin Flock, MD ED   04/15/2015 12:01 AM 04/15/2015  7:03 PM Full Code [id number]  Lance Coon, MD Inpatient   12/23/2014  1:09 PM 12/24/2014  5:17 PM Full Code [id number]   Burnell Blanks, MD Inpatient   12/23/2014  4:22 AM 12/23/2014  1:09 PM Full Code [id number]  Alwyn Pea, MD ED    Advance Directive Documentation        Most Recent Value   Type of Advance Directive  Healthcare Power of Attorney, Living will   Pre-existing out of facility DNR order (yellow form or pink MOST form)     "MOST" Form in Place?       Disposition Plan: TBD  Consultants:  Critical care specialist  Nephrology  Antibiotics:  Meropenem  Time spent: 24 minutes  [place], [place]

## 2016-01-23 NOTE — Progress Notes (Signed)
Placed patient on bipap for night time use. Initially started at set pressures of 18/10...however, after a couple of minutes patients begin to c/o too much air.. So titrated pressures back. See flowsheet. No distress noted. Volumes noted in 500's. Will continue to monitor.

## 2016-01-23 NOTE — Progress Notes (Signed)
PATIENT ID: [id number] with diabetes mellitus type 2, coronary artery disease status post PCI/2016, hypertension, hyperlipidemia, chronic anemia, COPD, and paroxysmal atrial fibrillation here with acute on chronic diastolic heart failure, atrial fibrillation with rapid ventricular response, an acute on chronic renal failure.  INTERVAL HISTORY: [name] went back into atrial fibrillation last night.  SUBJECTIVE:  Feels well. Breathing feels like she is back to baseline. She denies any chest pain or palpitations.  Unaware that she was back in atrial fibrillation.  PHYSICAL EXAM Filed Vitals:   01/23/16 0302 01/23/16 0400 01/23/16 0500 01/23/16 0600  BP:  108/61 119/54 101/56  Pulse:  69 72 69  Temp:      TempSrc:      Resp: '15 19 15 15  '$ Height:      Weight:      SpO2: 98% 98% 99% 99%   General:  Well-appearing.  Breathing mildly labored. No acute distress.  Frequent coughing that sounds wet but nonproductive. Neck: No JVD. No carotid bruits. Lungs: Diminished at the bases.   Heart:  Irregularly irregular.  No murmurs, rubs, or gallops. Normal S1/S2. Abdomen:  Soft. Nontender, nondistended. Active bowel sounds.  Extremities:  Warm and well-perfused. No edema.  LABS: Lab Results  Component Value Date   TROPONINI <0.03 01/22/2016   Results for orders placed or performed during the hospital encounter of 01/19/16 (from the past 24 hour(s))  Glucose, capillary     Status: Abnormal   Collection Time: 01/22/16  4:57 PM  Result Value Ref Range   Glucose-Capillary 158 (H) 65 - 99 mg/dL  Glucose, capillary     Status: Abnormal   Collection Time: 01/22/16  9:19 PM  Result Value Ref Range   Glucose-Capillary 153 (H) 65 - 99 mg/dL   Comment 1 Notify RN   Basic metabolic panel     Status: Abnormal   Collection Time: 01/23/16  5:09 AM  Result Value Ref Range   Sodium 138 135 - 145 mmol/L   Potassium 4.2 3.5 - 5.1 mmol/L   Chloride 94 (L) 101 - 111 mmol/L   CO2 34 (H) 22 - 32 mmol/L   Glucose, Bld 112 (H) 65 - 99 mg/dL   BUN 82 (H) 6 - 20 mg/dL   Creatinine, Ser 2.48 (H) 0.44 - 1.00 mg/dL   Calcium 8.6 (L) 8.9 - 10.3 mg/dL   GFR calc non Af Amer 17 (L) >60 mL/min   GFR calc Af Amer 20 (L) >60 mL/min   Anion gap 10 5 - 15  Procalcitonin     Status: None   Collection Time: 01/23/16  5:09 AM  Result Value Ref Range   Procalcitonin 0.43 ng/mL  Magnesium     Status: None   Collection Time: 01/23/16  5:09 AM  Result Value Ref Range   Magnesium 2.0 1.7 - 2.4 mg/dL  Glucose, capillary     Status: Abnormal   Collection Time: 01/23/16  7:10 AM  Result Value Ref Range   Glucose-Capillary 121 (H) 65 - 99 mg/dL  Glucose, capillary     Status: Abnormal   Collection Time: 01/23/16 11:00 AM  Result Value Ref Range   Glucose-Capillary 208 (H) 65 - 99 mg/dL    Intake/Output Summary (Last 24 hours) at 01/23/16 1335 Last data filed at 01/23/16 0600  Gross per 24 hour  Intake     30 ml  Output   2525 ml  Net  -2495 ml    Telemetry:  Atrial fibrillation in the  140s until 10:15 PM. Since then, sinus rhythm in the 80s.  ASSESSMENT AND PLAN:  Principal Problem:   Acute on chronic renal failure (HCC) Active Problems:   Hyperkalemia   Chronic obstructive pulmonary disease with acute exacerbation (HCC)   SOB (shortness of breath)   Paroxysmal atrial fibrillation with rapid ventricular response (HCC)   Iron deficiency anemia due to chronic blood loss   Essential hypertension   Acute on chronic diastolic heart failure ([place])   # Paroxysmal atrial fibrillation: Ms. Deitrick Is back in atrial fibrillation. Rates are poorly-controlled. Her blood pressure is too low to titrate her nodal agents. We will restart her amiodarone drip and stop oral amiodarone. She continues to go in and out of A. fib we may have to add digoxin.  She has been deemed a poor anticoagulation candidate.  Given that her stent has been in place for over a year and she has been on aspirin and clopidogrel, could  consider stopping either of those agents and starting a NOAC.  This patients CHA2DS2-VASc Score and unadjusted Ischemic Stroke Rate (% per year) is equal to 9.7 % stroke rate/year from a score of 6  Above score calculated as 1 point each if present [CHF, HTN, DM, Vascular=MI/PAD/Aortic Plaque, Age if 65-74, or Female] Above score calculated as 2 points each if present [Age > 75, or Stroke/TIA/TE]   # Acute on chronic renal failure: Renal function is slightly improved. Nephrology is following. We will continue to hold Lasix at this time. Renal function worsened with IV lasix.    # Acute on chronic diastolic heart failure: Ms. Dercole remains volume overloaded, but will hold on diuresis as above.   BP is well-controlled on diltiazem and metoprolol.    # CAD s/p PCI: Ms. Pasqua had PCI 11/2014.  Continue aspirin and Plavix.  Given that it has been >1 year and she has chronic anemia, could consider switching to aspirin only or aspirin and a NOAC.  Will defer to her primary cardiologist, Dr. Rockey Situ.  Continue atorvastatin.    Shefali Ng C. Oval Linsey, MD, [facility] 01/23/2016 1:35 PM

## 2016-01-24 ENCOUNTER — Inpatient Hospital Stay: Payer: Medicare Other

## 2016-01-24 DIAGNOSIS — I480 Paroxysmal atrial fibrillation: Secondary | ICD-10-CM

## 2016-01-24 DIAGNOSIS — I10 Essential (primary) hypertension: Secondary | ICD-10-CM

## 2016-01-24 DIAGNOSIS — D500 Iron deficiency anemia secondary to blood loss (chronic): Secondary | ICD-10-CM

## 2016-01-24 LAB — CULTURE, BLOOD (ROUTINE X 2)
CULTURE: NO GROWTH
Culture: NO GROWTH

## 2016-01-24 LAB — GLUCOSE, CAPILLARY
GLUCOSE-CAPILLARY: 234 mg/dL — AB (ref 65–99)
GLUCOSE-CAPILLARY: 225 mg/dL — AB (ref 65–99)
GLUCOSE-CAPILLARY: 122 mg/dL — AB (ref 65–99)
Glucose-Capillary: 115 mg/dL — ABNORMAL HIGH (ref 65–99)

## 2016-01-24 MED ORDER — FUROSEMIDE 10 MG/ML IJ SOLN
40.0000 mg | Freq: Every day | INTRAMUSCULAR | Status: DC
  Administered 2016-01-25: 40 mg via INTRAVENOUS
  Filled 2016-01-24: qty 4

## 2016-01-24 NOTE — Clinical Social Work Note (Signed)
Clinical Social Work Assessment  Patient Details  Name: Regina Burke MRN: [id number] Date of Birth: 08-Apr-1933  Date of referral:  01/24/16               Reason for consult:  Discharge Planning                Permission sought to share information with:    Permission granted to share information::     Name::        Agency::     Relationship::     Contact Information:     Housing/Transportation Living arrangements for the past 2 months:  Single Family Home Source of Information:  Patient Patient Interpreter Needed:  None Criminal Activity/Legal Involvement Pertinent to Current Situation/Hospitalization:  No - Comment as needed Significant Relationships:  Adult Children, Other Family Members Lives with:  Adult Children Do you feel safe going back to the place where you live?  Yes Need for family participation in patient care:  No (Coment)  Care giving concerns:  PT recommends SNF placement for patient.    Social Worker assessment / plan:  CSW was consulted by PT stating that patient would benefit from SNF placement. RNCM informed CSW that patient declined SNF placement. CSW met with patient and her son at bedside. Introduced herself and her role. Per patient she lives with her son. She stated that she went to [facility] after her last hospitalization and was discharged with [facility]. She reports that she received the same PT at home that she received a [facility]. Reported that she rather do her therapy at home. Patient is agreeable to [facility] services. RNCM is aware. CSW is signing off but is available if a CSW need were to arise.   Employment status:  Retired Forensic scientist:  Medicare PT Recommendations:  [place] / Referral to community resources:  [place]  Patient/Family's Response to care:  Patient reports that she is pleased with her care at [facility] and is appreciative of CSW coming to speak to her about discharge plans.   Patient/Family's  Understanding of and Emotional Response to Diagnosis, Current Treatment, and Prognosis:  Patient understands her diagnosis and stated she'd like to return home with [facility].   Emotional Assessment Appearance:  Appears stated age Attitude/Demeanor/Rapport:   (None) Affect (typically observed):  Calm, Pleasant Orientation:  Oriented to Self, Oriented to Place, Oriented to  Time, Oriented to Situation Alcohol / Substance use:  Not Applicable Psych involvement (Current and /or in the community):  No (Comment)  Discharge Needs  Concerns to be addressed:  Discharge Planning Concerns Readmission within the last 30 days:  No Current discharge risk:  Chronically ill Barriers to Discharge:  Continued Medical Work up   [employer], LCSW 01/24/2016, 3:34 PM

## 2016-01-24 NOTE — Evaluation (Signed)
Physical Therapy Evaluation Patient Details Name: Regina Burke MRN: [id number] DOB: [birth year] Today's Date: 01/24/2016   History of Present Illness  82 yo female with onset of  weakness and renal failure, now has new HD shunt and is referred to PT to continue rehab.  Her PMHx: lung CA, DM, CAD, COPD, gout, a-fib, anxiety  Clinical Impression  Pt was not able to do much although she is getting up to [facility] with nursing.  Her plan is to restore as much independence as possible, due to her pulses limiting activity esp being up to 130 range.      Follow Up Recommendations SNF (Pt will refuse SNF to HHPT)    Equipment Recommendations  None recommended by PT    Recommendations for Other Services Rehab consult     Precautions / Restrictions Precautions Precautions: Fall (telemetry) Restrictions Weight Bearing Restrictions: No      Mobility  Bed Mobility Overal bed mobility: Needs Assistance Bed Mobility:  (moving in bed with min assist to scoot over in bed)              Transfers                 General transfer comment: unable due to very high pulses  Ambulation/Gait                Stairs            Wheelchair Mobility    Modified Rankin (Stroke Patients Only)       Balance                                             Pertinent Vitals/Pain Pain Assessment: No/denies pain    Home Living Family/patient expects to be discharged to:: Private residence Living Arrangements: Children Available Help at Discharge: Family Type of Home: House Home Access: Stairs to enter Entrance Stairs-Rails: Right Entrance Stairs-Number of Steps: 1 step to get onto porch, one step to get into house, one step inside house between rooms. Can hold onto wall for support with each step. Home Layout: One level Home Equipment: Walker - 2 wheels;Cane - single point;Shower seat;Grab bars - tub/shower      Prior Function Level of Independence:  Independent with assistive device(s)         Comments: Pt ususally able to perform household ambulation, but recently not using assistive device so has been having falls      Hand Dominance        Extremity/Trunk Assessment   Upper Extremity Assessment: Generalized weakness           Lower Extremity Assessment: Generalized weakness      Cervical / Trunk Assessment: Normal  Communication   Communication: No difficulties  Cognition Arousal/Alertness: Lethargic Behavior During Therapy: WFL for tasks assessed/performed Overall Cognitive Status: Within Functional Limits for tasks assessed                      General Comments General comments (skin integrity, edema, etc.): Pt has a resting pulse of 130 so did not challenge her to get OOB.  Rather, tested her at bed level to maintain some control of her pulses    Exercises        Assessment/Plan    PT Assessment Patient needs continued PT services  PT Diagnosis Generalized weakness   PT  Problem List Decreased strength;Decreased range of motion;Decreased activity tolerance;Decreased balance;Decreased mobility;Decreased coordination;Cardiopulmonary status limiting activity  PT Treatment Interventions DME instruction;Gait training;Functional mobility training;Therapeutic activities;Therapeutic exercise;Balance training;Neuromuscular re-education;Wheelchair mobility training;Patient/family education;Cognitive remediation   PT Goals (Current goals can be found in the Care Plan section) Acute Rehab PT Goals Patient Stated Goal: to feel better PT Goal Formulation: With patient Time For Goal Achievement: 02/07/16 Potential to Achieve Goals: Good    Frequency Min 2X/week   Barriers to discharge   has family with her but uncontrolled vital signs    Co-evaluation               End of Session Equipment Utilized During Treatment: Oxygen Activity Tolerance: Patient limited by fatigue;Patient limited by  lethargy;Treatment limited secondary to medical complications (Comment) (high resting pulses) Patient left: in bed;with call bell/phone within reach;with bed alarm set Nurse Communication: Mobility status         Time: 1341-1405 PT Time Calculation (min) (ACUTE ONLY): 24 min   Charges:   PT Evaluation $PT Eval Moderate Complexity: 1 Procedure PT Treatments $Therapeutic Exercise: 8-22 mins   PT G Codes:        Ramond Dial 29-Jan-2016, 4:16 PM    Mee Hives, PT MS Acute Rehab Dept. Number: [address]

## 2016-01-24 NOTE — Progress Notes (Signed)
MD lateef was notified of need to remove fem line. Order was to place IJ PICC

## 2016-01-24 NOTE — Plan of Care (Signed)
Problem: Acute Rehab PT Goals(only PT should resolve) Goal: Pt Will Go Supine/Side To Sit With pulses below 110 b/min Goal: Patient Will Transfer Sit To/From Stand With pulses below 110 b/min Goal: Pt Will Ambulate With pulses below 110 b/min

## 2016-01-24 NOTE — Progress Notes (Signed)
[facility]  ROUNDING NOTE   Subjective:  Repeat renal function testing still pending for today. Unclear if urine output is accurate but recorded only as 300 cc over the preceding 24 hours.  Objective:  Vital signs in last 24 hours:  Temp:  [97.9 F (36.6 C)-98.7 F (37.1 C)] 97.9 F (36.6 C) (05/29 1131) Pulse Rate:  [85-123] 123 (05/29 1131) Resp:  [16-20] 18 (05/29 1131) BP: (104-118)/(48-81) 108/68 mmHg (05/29 1131) SpO2:  [96 %-98 %] 97 % (05/29 1131)  Weight change:  Filed Weights   01/19/16 1507 01/19/16 2111  Weight: 83.008 kg (183 lb) 83.416 kg (183 lb 14.4 oz)    Intake/Output: I/O last 3 completed shifts: In: 270 [P.O.:240; I.V.:30] Out: 1325 [Urine:1325]   Intake/Output this shift:  Total I/O In: 480 [P.O.:480] Out: 0   Physical Exam: General: No acute distress   Head: Normocephalic, atraumatic. Moist oral mucosal membranes  Eyes: Anicteric  Neck: Supple, trachea midline  Lungs:  Diminished breath sounds at the bases, normal effort   Heart: Irregular, no rubs  Abdomen:  Soft, nontender, BS present  Extremities: trace peripheral edema.  Neurologic: Nonfocal, moving all four extremities  Skin: No lesions       Basic Metabolic Panel:  Recent Labs Lab 01/19/16 1515 01/19/16 2155 01/20/16 0459 01/21/16 0910 01/22/16 0010 01/23/16 0509  NA 136  --  138 138 138 138  K 6.0* 5.0 4.9 4.4 5.2* 4.2  CL 96*  --  100* 102 100* 94*  CO2 30  --  '28 27 30 '$ 34*  GLUCOSE 92  --  277* 183* 145* 112*  BUN 91*  --  84* 68* 79* 82*  CREATININE 2.65*  --  2.60* 1.96* 2.61* 2.48*  CALCIUM 9.2  --  8.1* 8.7* 8.5* 8.6*  MG  --   --   --   --   --  2.0    Liver Function Tests:  Recent Labs Lab 01/19/16 1358 01/19/16 1515  AST 43* 41  ALT 48 46  ALKPHOS 48 41  BILITOT 0.7 0.5  PROT 7.9 7.3  ALBUMIN 4.6 4.3   No results for input(s): LIPASE, AMYLASE in the last 168 hours. No results for input(s): AMMONIA in the last 168  hours.  CBC:  Recent Labs Lab 01/19/16 1358 01/19/16 1515 01/20/16 0459 01/22/16 0010  WBC 6.1 5.5 3.6 7.0  NEUTROABS 4.8 4.2  --   --   HGB 10.4* 9.8* 8.2* 8.0*  HCT 31.3* 29.6* 25.3* 24.6*  MCV 77.7* 77.2* 79.2* 79.4*  PLT 239 218 175 188    Cardiac Enzymes:  Recent Labs Lab 01/19/16 1515 01/21/16 1228 01/21/16 1905 01/22/16 0010  CKTOTAL  --  85  --   --   TROPONINI <0.03 <0.03 <0.03 <0.03    BNP: Invalid input(s): POCBNP  CBG:  Recent Labs Lab 01/23/16 1100 01/23/16 1658 01/23/16 2126 01/24/16 0716 01/24/16 1128  GLUCAP 208* 154* 127* 115* 48*    Microbiology: Results for orders placed or performed during the hospital encounter of 01/19/16  Blood culture (routine x 2)     Status: None   Collection Time: 01/19/16  3:15 PM  Result Value Ref Range Status   Specimen Description BLOOD RIGHT WRIST  Final   Special Requests BOTTLES DRAWN AEROBIC AND ANAEROBIC 8CC  Final   Culture NO GROWTH 5 DAYS  Final   Report Status 01/24/2016 FINAL  Final  Blood culture (routine x 2)     Status: None  Collection Time: 01/19/16  4:42 PM  Result Value Ref Range Status   Specimen Description BLOOD LEFT HAND  Final   Special Requests BOTTLES DRAWN AEROBIC AND ANAEROBIC [place]  Final   Culture NO GROWTH 5 DAYS  Final   Report Status 01/24/2016 FINAL  Final  MRSA PCR Screening     Status: None   Collection Time: 01/21/16  3:45 PM  Result Value Ref Range Status   MRSA by PCR NEGATIVE NEGATIVE Final    Comment:        The GeneXpert MRSA Assay (FDA approved for NASAL specimens only), is one component of a comprehensive MRSA colonization surveillance program. It is not intended to diagnose MRSA infection nor to guide or monitor treatment for MRSA infections.     Coagulation Studies: No results for input(s): LABPROT, INR in the last 72 hours.  Urinalysis: No results for input(s): COLORURINE, LABSPEC, PHURINE, GLUCOSEU, HGBUR, BILIRUBINUR, KETONESUR,  PROTEINUR, UROBILINOGEN, NITRITE, LEUKOCYTESUR in the last 72 hours.  Invalid input(s): APPERANCEUR    Imaging: Dg Chest 1 View  01/24/2016  CLINICAL DATA:  Central line placement EXAM: CHEST 1 VIEW COMPARISON:  Chest radiograph from one day prior. FINDINGS: Right internal jugular central venous catheter terminates at the cavoatrial junction. Surgical clips overlie the right upper chest. Stable cardiomediastinal silhouette with mild cardiomegaly. No pneumothorax. Stable small bilateral pleural effusions, left greater than right. Stable focal irregular opacity in the apical right upper lobe. Stable bibasilar lung opacities, favor atelectasis. Stable borderline mild pulmonary edema. IMPRESSION: 1. Right internal jugular central venous catheter terminates at the cavoatrial junction. No pneumothorax . 2. Stable borderline mild congestive heart failure. 3. Stable small bilateral pleural effusions, left greater than right. 4. Stable bibasilar lung opacities, favor atelectasis. 5. Stable focal irregular opacity in the apical right upper lobe, nonspecific, differential includes posttreatment change or recurrent tumor, please see recent chest CT report for further details. Electronically Signed   By: Ilona Sorrel M.D.   On: 01/24/2016 12:39   Dg Chest Port 1 View  01/23/2016  CLINICAL DATA:  Patient with respiratory failure, CHF and COPD. EXAM: PORTABLE CHEST 1 VIEW COMPARISON:  Chest CT 01/21/2016 FINDINGS: Monitoring leads overlie the patient. Stable enlarged cardiac and mediastinal contours. Persistent small partially layering bilateral pleural effusions with underlying basilar opacities favored to represent atelectasis. Unchanged focal consolidation within the right suprahilar location. No pneumothorax. IMPRESSION: Stable cardiomegaly. Layering small bilateral pleural effusions and underlying atelectasis. Persistent right upper lobe consolidation. Electronically Signed   By: Lovey Newcomer M.D.   On: 01/23/2016  09:20     Medications:   . amiodarone 60 mg/hr (01/24/16 1255)   . antiseptic oral rinse  7 mL Mouth Rinse q12n4p  . aspirin EC  81 mg Oral QPM  . atorvastatin  20 mg Oral QPM  . budesonide (PULMICORT) nebulizer solution  0.25 mg Nebulization BID  . chlorhexidine  15 mL Mouth Rinse BID  . cholecalciferol  800 Units Oral QPM  . clopidogrel  75 mg Oral QPM  . diltiazem  240 mg Oral QPM  . febuxostat  40 mg Oral QPM  . ferrous sulfate  325 mg Oral QPM  . furosemide  40 mg Intravenous Daily  . heparin  5,000 Units Subcutaneous Q8H  . insulin aspart  0-15 Units Subcutaneous TID WC  . insulin aspart  0-5 Units Subcutaneous QHS  . metoprolol succinate  25 mg Oral QPM  . multivitamin-lutein  1 capsule Oral QPM  . pantoprazole  40  mg Oral QPM  . predniSONE  30 mg Oral Q breakfast  . sertraline  50 mg Oral QPM  . sodium chloride flush  3 mL Intravenous Q12H  . cyanocobalamin  500 mcg Oral QPM   ALPRAZolam, labetalol, nitroGLYCERIN, ondansetron  Assessment/ Plan:  82 y.o. female with diabetes mellitus type II, coronary artery disease, hypertension, hyperlipidemia, generalized anxiety disorder, gout, anemia, history of lung cancer, COPD, atrial fibrillation, fatty liver disease, GERD, osteoporosis   1. Acute renal failure/Chronic kidney disease stage III with hyperkalemia and proteinuria: Endocrinology has ruled out hypoaldosteronism and believes her hyperkalemia is due to her renal failure as an outpt.  Acute renal failure likely due to poor PO intake. Baseline EGFR 32. - Renal function remains significantly less than her prior baseline. Most recent BUN was 82 and creatinine 2.4. Labs from today are still pending. She was unable to tolerate IV fluids previously. She is now on furosemide for a total volume overload. Continue to monitor renal function trend.  2. Hypertension: Blood pressure currently 108/68. Continue diltiazem and metoprolol.  3. Anemia with chronic kidney disease:  Hemoglobin 8.0 at last check. Consider Epogen if hemoglobin continues to decline.   LOS: 5 Nikkie Liming 5/29/20173:34 PM

## 2016-01-24 NOTE — Care Management (Signed)
Admitted 5/24 with COPD exacerbation after failing outpatient treatment by Dr. Ginette Pitman her PCP. She went to the cancer center for regularly scheduled appointment and was found to have a potassium of 6 and worsening renal function.  5/26 patient transferred to the ICU following worsening respiratory status with hypoxia and dyspnea and worsening afib, CHF.  5/28 transferred out of the ICU on Amnioderone gtt for afib. She remains on Amnioderone gtt with HR 100-120's. O2 @ 3-4L. Patient on 2 L O2 chronically through [facility]. She lives at home and her son lives with her. She uses a cane and a walker. PT consult is pending. She is adamant that she does not want to go to a SNF at discharge but is open to home health. She prefers [facility] since that is who her O2 is through. Referral to [facility] with [facility].

## 2016-01-24 NOTE — Progress Notes (Signed)
Room air. A fib. FS are stable. Takes meds ok. Amio gtt @ 33.3. Fem line was removed and R IJ was replaced. Up to [facility] and tolerated it well. Pt has no further concerns at this time.

## 2016-01-24 NOTE — Progress Notes (Signed)
Patient ID: Regina Burke, female   DOB: [birth year], 82 y.o.   MRN: [id number] [facility] PROGRESS NOTE  Regina Burke XKG:[id number] DOB: 05-Apr-1933 DOA: 01/19/2016 PCP: Tracie Harrier, MD  HPI/Subjective: Patient feeling better than yesterday. She is asking when she can go home. Patient still on amiodarone drip for heart rate control. Patient still coughing and wheezing.  Objective: Filed Vitals:   01/24/16 0442 01/24/16 1131  BP: 114/49 108/68  Pulse: 106 123  Temp: 98 F (36.7 C) 97.9 F (36.6 C)  Resp: 16 18    Filed Weights   01/19/16 1507 01/19/16 2111  Weight: 83.008 kg (183 lb) 83.416 kg (183 lb 14.4 oz)    ROS: Review of Systems  Constitutional: Negative for fever and chills.  Eyes: Negative for blurred vision.  Respiratory: Positive for cough, shortness of breath and wheezing.   Cardiovascular: Negative for chest pain.  Gastrointestinal: Negative for nausea, vomiting, abdominal pain, diarrhea and constipation.  Genitourinary: Negative for dysuria.  Musculoskeletal: Negative for joint pain.  Neurological: Negative for dizziness and headaches.   Exam: Physical Exam  Constitutional: She is oriented to person, place, and time.  HENT:  Nose: No mucosal edema.  Mouth/Throat: No oropharyngeal exudate or posterior oropharyngeal edema.  Eyes: Conjunctivae, EOM and lids are normal. Pupils are equal, round, and reactive to light.  Neck: No JVD present. Carotid bruit is not present. No edema present. No thyroid mass and no thyromegaly present.  Cardiovascular: S1 normal and S2 normal.  Tachycardia present.  Exam reveals no gallop.   No murmur heard. Pulses:      Dorsalis pedis pulses are 2+ on the right side, and 2+ on the left side.  Respiratory: No accessory muscle usage. No respiratory distress. She has decreased breath sounds in the right lower field and the left lower field. She has no wheezes. She has no rhonchi. She has rales in the right lower field  and the left lower field.  GI: Soft. Bowel sounds are normal. There is no tenderness.  Musculoskeletal:       Right ankle: She exhibits swelling.       Left ankle: She exhibits swelling.  Lymphadenopathy:    She has no cervical adenopathy.  Neurological: She is alert and oriented to person, place, and time. No cranial nerve deficit.  Skin: Skin is warm. No rash noted. Nails show no clubbing.  Psychiatric: She has a normal mood and affect.      Data Reviewed: Basic Metabolic Panel:  Recent Labs Lab 01/19/16 1515 01/19/16 2155 01/20/16 0459 01/21/16 0910 01/22/16 0010 01/23/16 0509  NA 136  --  138 138 138 138  K 6.0* 5.0 4.9 4.4 5.2* 4.2  CL 96*  --  100* 102 100* 94*  CO2 30  --  '28 27 30 '$ 34*  GLUCOSE 92  --  277* 183* 145* 112*  BUN 91*  --  84* 68* 79* 82*  CREATININE 2.65*  --  2.60* 1.96* 2.61* 2.48*  CALCIUM 9.2  --  8.1* 8.7* 8.5* 8.6*  MG  --   --   --   --   --  2.0   Liver Function Tests:  Recent Labs Lab 01/19/16 1358 01/19/16 1515  AST 43* 41  ALT 48 46  ALKPHOS 48 41  BILITOT 0.7 0.5  PROT 7.9 7.3  ALBUMIN 4.6 4.3   CBC:  Recent Labs Lab 01/19/16 1358 01/19/16 1515 01/20/16 0459 01/22/16 0010  WBC 6.1 5.5 3.6 7.0  NEUTROABS  4.8 4.2  --   --   HGB 10.4* 9.8* 8.2* 8.0*  HCT 31.3* 29.6* 25.3* 24.6*  MCV 77.7* 77.2* 79.2* 79.4*  PLT 239 218 175 188   Cardiac Enzymes:  Recent Labs Lab 01/19/16 1515 01/21/16 1228 01/21/16 1905 01/22/16 0010  CKTOTAL  --  85  --   --   TROPONINI <0.03 <0.03 <0.03 <0.03   BNP (last 3 results)  Recent Labs  11/24/15 2223 01/19/16 1515 01/21/16 1228  BNP 300.0* 330.0* 943.0*     CBG:  Recent Labs Lab 01/23/16 1100 01/23/16 1658 01/23/16 2126 01/24/16 0716 01/24/16 1128  GLUCAP 208* 154* 127* 115* 234*    Recent Results (from the past 240 hour(s))  Blood culture (routine x 2)     Status: None   Collection Time: 01/19/16  3:15 PM  Result Value Ref Range Status   Specimen Description  BLOOD RIGHT WRIST  Final   Special Requests BOTTLES DRAWN AEROBIC AND ANAEROBIC 8CC  Final   Culture NO GROWTH 5 DAYS  Final   Report Status 01/24/2016 FINAL  Final  Blood culture (routine x 2)     Status: None   Collection Time: 01/19/16  4:42 PM  Result Value Ref Range Status   Specimen Description BLOOD LEFT HAND  Final   Special Requests BOTTLES DRAWN AEROBIC AND ANAEROBIC [place]  Final   Culture NO GROWTH 5 DAYS  Final   Report Status 01/24/2016 FINAL  Final  MRSA PCR Screening     Status: None   Collection Time: 01/21/16  3:45 PM  Result Value Ref Range Status   MRSA by PCR NEGATIVE NEGATIVE Final    Comment:        The GeneXpert MRSA Assay (FDA approved for NASAL specimens only), is one component of a comprehensive MRSA colonization surveillance program. It is not intended to diagnose MRSA infection nor to guide or monitor treatment for MRSA infections.      Studies: Dg Chest 1 View  01/24/2016  CLINICAL DATA:  Central line placement EXAM: CHEST 1 VIEW COMPARISON:  Chest radiograph from one day prior. FINDINGS: Right internal jugular central venous catheter terminates at the cavoatrial junction. Surgical clips overlie the right upper chest. Stable cardiomediastinal silhouette with mild cardiomegaly. No pneumothorax. Stable small bilateral pleural effusions, left greater than right. Stable focal irregular opacity in the apical right upper lobe. Stable bibasilar lung opacities, favor atelectasis. Stable borderline mild pulmonary edema. IMPRESSION: 1. Right internal jugular central venous catheter terminates at the cavoatrial junction. No pneumothorax . 2. Stable borderline mild congestive heart failure. 3. Stable small bilateral pleural effusions, left greater than right. 4. Stable bibasilar lung opacities, favor atelectasis. 5. Stable focal irregular opacity in the apical right upper lobe, nonspecific, differential includes posttreatment change or recurrent tumor, please  see recent chest CT report for further details. Electronically Signed   By: Ilona Sorrel M.D.   On: 01/24/2016 12:39   Dg Chest Port 1 View  01/23/2016  CLINICAL DATA:  Patient with respiratory failure, CHF and COPD. EXAM: PORTABLE CHEST 1 VIEW COMPARISON:  Chest CT 01/21/2016 FINDINGS: Monitoring leads overlie the patient. Stable enlarged cardiac and mediastinal contours. Persistent small partially layering bilateral pleural effusions with underlying basilar opacities favored to represent atelectasis. Unchanged focal consolidation within the right suprahilar location. No pneumothorax. IMPRESSION: Stable cardiomegaly. Layering small bilateral pleural effusions and underlying atelectasis. Persistent right upper lobe consolidation. Electronically Signed   By: Lovey Newcomer M.D.   On: 01/23/2016 09:20  Scheduled Meds: . antiseptic oral rinse  7 mL Mouth Rinse q12n4p  . aspirin EC  81 mg Oral QPM  . atorvastatin  20 mg Oral QPM  . budesonide (PULMICORT) nebulizer solution  0.25 mg Nebulization BID  . chlorhexidine  15 mL Mouth Rinse BID  . cholecalciferol  800 Units Oral QPM  . clopidogrel  75 mg Oral QPM  . diltiazem  240 mg Oral QPM  . febuxostat  40 mg Oral QPM  . ferrous sulfate  325 mg Oral QPM  . furosemide  40 mg Intravenous Daily  . heparin  5,000 Units Subcutaneous Q8H  . insulin aspart  0-15 Units Subcutaneous TID WC  . insulin aspart  0-5 Units Subcutaneous QHS  . metoprolol succinate  25 mg Oral QPM  . multivitamin-lutein  1 capsule Oral QPM  . pantoprazole  40 mg Oral QPM  . predniSONE  30 mg Oral Q breakfast  . sertraline  50 mg Oral QPM  . sodium chloride flush  3 mL Intravenous Q12H  . cyanocobalamin  500 mcg Oral QPM   Continuous Infusions: . amiodarone 60 mg/hr (01/24/16 1255)    Assessment/Plan:  1. Acute respiratory failure on chronic respiratory failure. Now on nasal cannula. Continue oxygen supplementation and BiPAP at night. Stop meropenem. Chest x-ray today  showed right upper lobe consolidation which is likely scar tissue from the CAT scan. This is her site of prior lung cancer. 2. Acute diastolic congestive heart failure and Fluid overload. Patient also has bilateral pleural effusions. Cardiology holding Lasix at this point 3. COPD exacerbation with poor air entry. Continue prednisone taper. Continue budesonide nebulizers to her DuoNeb. Stop antibiotics. 4. Acute kidney injury on chronic kidney disease. Watch closely with diuresis. 5. Hyperkalemia. Nephrology to consider new medication 6. Anemia of chronic disease 7. Anxiety depression continue psychiatric medications 8. Hyperlipidemia unspecified on atorvastatin 9. GERD on Protonix  Code Status:     Code Status Orders        Start     Ordered   01/19/16 1957  Limited resuscitation (code)   Continuous    Question Answer Comment  In the event of cardiac or respiratory ARREST: Initiate Code Blue, Call Rapid Response Yes   In the event of cardiac or respiratory ARREST: Perform CPR No   In the event of cardiac or respiratory ARREST: Perform Intubation/Mechanical Ventilation Yes   In the event of cardiac or respiratory ARREST: Use NIPPV/BiPAp only if indicated Yes   In the event of cardiac or respiratory ARREST: Administer ACLS medications if indicated No   In the event of cardiac or respiratory ARREST: Perform Defibrillation or Cardioversion if indicated No      01/19/16 1956    Code Status History    Date Active Date Inactive Code Status Order ID Comments User Context   11/26/2015  4:02 AM 11/26/2015  8:59 PM Full Code [id number]  Silver Huguenin, RN Inpatient   06/14/2015  6:57 PM 06/15/2015  8:54 PM DNR [id number]  Colleen Can, MD Inpatient   06/07/2015 10:20 PM 06/14/2015  6:56 PM Full Code [id number]  Lytle Butte, MD ED   05/15/2015  3:27 PM 05/24/2015  5:04 PM Full Code [id number]  Gladstone Lighter, MD ED   05/08/2015 11:23 AM 05/11/2015  3:13 PM Full Code [id number]  Dustin Flock,  MD ED   04/15/2015 12:01 AM 04/15/2015  7:03 PM Full Code [id number]  Lance Coon, MD Inpatient   12/23/2014  1:09 PM 12/24/2014  5:17 PM Full Code [id number]  Burnell Blanks, MD Inpatient   12/23/2014  4:22 AM 12/23/2014  1:09 PM Full Code [id number]  Alwyn Pea, MD ED    Advance Directive Documentation        Most Recent Value   Type of Advance Directive  Healthcare Power of Attorney, Living will   Pre-existing out of facility DNR order (yellow form or pink MOST form)     "MOST" Form in Place?       Disposition Plan: TBD  Consultants:  Critical care specialist  Nephrology  Cardiology  Time spent: 22 minutes  [place], [facility]

## 2016-01-24 NOTE — Progress Notes (Signed)
Patient: Regina Burke / Admit Date: 01/19/2016 / Date of Encounter: 01/24/2016, 2:05 PM   Subjective: Review of telemetry shows she converted to normal sinus rhythm on amiodarone infusion, back into atrial fibrillation yesterday morning May 28 on oral amiodarone. Restarted on amiodarone infusion. This morning continues to run rate controlled atrial fibrillation 0.5 mg/m amiodarone IV infusion  No complaints, having a PICC line placed Still with considerable chest congestion, cough, sputum is not productive yet Has a central line in her right common femoral vein, has not been able to sit up  Review of Systems: Review of Systems  Respiratory: Positive for cough and shortness of breath.   Cardiovascular: Negative.   Gastrointestinal: Negative.   Musculoskeletal: Negative.   Neurological: Positive for weakness.  Psychiatric/Behavioral: Negative.   All other systems reviewed and are negative.   Objective: Telemetry: Atrial fibrillation with ventricular rate 100 bpm Physical Exam: Blood pressure 108/68, pulse 123, temperature 97.9 F (36.6 C), temperature source Oral, resp. rate 18, height '5\' 4"'$  (1.626 m), weight 183 lb 14.4 oz (83.416 kg), SpO2 97 %. Body mass index is 31.55 kg/(m^2). General: Well developed, well nourished, in no acute distress. On nasal cannula Head: Normocephalic, atraumatic, sclera non-icteric, no xanthomas, nares are without discharge. Neck: Negative for carotid bruits. JVP not elevated. Lungs: Mildly decreased breath sounds throughout, coarse rhonchi. Breathing is unlabored. Heart: Irregular rate and rhythm without murmurs, rubs, or gallops.  Abdomen: Soft, non-tender, non-distended with normoactive bowel sounds. No rebound/guarding. Extremities: No clubbing or cyanosis. No edema. Distal pedal pulses are 2+ and equal bilaterally. Neuro: Alert and oriented X 3. Moves all extremities spontaneously. Psych:  Responds to questions appropriately with a normal  affect.   Intake/Output Summary (Last 24 hours) at 01/24/16 1405 Last data filed at 01/24/16 0948  Gross per 24 hour  Intake    480 ml  Output    300 ml  Net    180 ml    Inpatient Medications:  . antiseptic oral rinse  7 mL Mouth Rinse q12n4p  . aspirin EC  81 mg Oral QPM  . atorvastatin  20 mg Oral QPM  . budesonide (PULMICORT) nebulizer solution  0.25 mg Nebulization BID  . chlorhexidine  15 mL Mouth Rinse BID  . cholecalciferol  800 Units Oral QPM  . clopidogrel  75 mg Oral QPM  . diltiazem  240 mg Oral QPM  . febuxostat  40 mg Oral QPM  . ferrous sulfate  325 mg Oral QPM  . furosemide  40 mg Intravenous Daily  . heparin  5,000 Units Subcutaneous Q8H  . insulin aspart  0-15 Units Subcutaneous TID WC  . insulin aspart  0-5 Units Subcutaneous QHS  . metoprolol succinate  25 mg Oral QPM  . multivitamin-lutein  1 capsule Oral QPM  . pantoprazole  40 mg Oral QPM  . predniSONE  30 mg Oral Q breakfast  . sertraline  50 mg Oral QPM  . sodium chloride flush  3 mL Intravenous Q12H  . cyanocobalamin  500 mcg Oral QPM   Infusions:  . amiodarone 60 mg/hr (01/24/16 1255)    Labs:  Recent Labs  01/22/16 0010 01/23/16 0509  NA 138 138  K 5.2* 4.2  CL 100* 94*  CO2 30 34*  GLUCOSE 145* 112*  BUN 79* 82*  CREATININE 2.61* 2.48*  CALCIUM 8.5* 8.6*  MG  --  2.0   No results for input(s): AST, ALT, ALKPHOS, BILITOT, PROT, ALBUMIN in the last 72  hours.  Recent Labs  01/22/16 0010  WBC 7.0  HGB 8.0*  HCT 24.6*  MCV 79.4*  PLT 188    Recent Labs  01/21/16 1905 01/22/16 0010  TROPONINI <0.03 <0.03   Invalid input(s): POCBNP No results for input(s): HGBA1C in the last 72 hours.   Weights: Filed Weights   01/19/16 1507 01/19/16 2111  Weight: 183 lb (83.008 kg) 183 lb 14.4 oz (83.416 kg)     Radiology/Studies:  Dg Chest 1 View  01/24/2016  CLINICAL DATA:  Central line placement EXAM: CHEST 1 VIEW COMPARISON:  Chest radiograph from one day prior. FINDINGS:  Right internal jugular central venous catheter terminates at the cavoatrial junction. Surgical clips overlie the right upper chest. Stable cardiomediastinal silhouette with mild cardiomegaly. No pneumothorax. Stable small bilateral pleural effusions, left greater than right. Stable focal irregular opacity in the apical right upper lobe. Stable bibasilar lung opacities, favor atelectasis. Stable borderline mild pulmonary edema. IMPRESSION: 1. Right internal jugular central venous catheter terminates at the cavoatrial junction. No pneumothorax . 2. Stable borderline mild congestive heart failure. 3. Stable small bilateral pleural effusions, left greater than right. 4. Stable bibasilar lung opacities, favor atelectasis. 5. Stable focal irregular opacity in the apical right upper lobe, nonspecific, differential includes posttreatment change or recurrent tumor, please see recent chest CT report for further details. Electronically Signed   By: Ilona Sorrel M.D.   On: 01/24/2016 12:39   Dg Chest 2 View  01/19/2016  CLINICAL DATA:  Dyspnea, hypoxia EXAM: CHEST  2 VIEW COMPARISON:  11/24/2015 and 01/12/2016 FINDINGS: Cardiomediastinal silhouette is stable. Again noted scarring and surgical clips in right upper lobe medially. No acute infiltrate or pleural effusion. No pulmonary edema. Osteopenia and mild degenerative changes thoracic spine. Hyperinflation again noted. IMPRESSION: No active disease. Hyperinflation again noted. Stable scarring and surgical clips in right upper lobe medially. Osteopenia and mild degenerative changes thoracic spine. Electronically Signed   By: Lahoma Crocker M.D.   On: 01/19/2016 16:28   Ct Chest Wo Contrast  01/22/2016  CLINICAL DATA:  Respiratory failure. Shortness of breath for 1 week. EXAM: CT CHEST WITHOUT CONTRAST TECHNIQUE: Multidetector CT imaging of the chest was performed following the standard protocol without IV contrast. COMPARISON:  Radiographs 01/20/2016. Most recent chest CT  06/08/2015 FINDINGS: Mediastinum/Lymph Nodes: Atherosclerotic calcification of the thoracic aorta. Coronary artery calcifications. Multi chamber cardiac enlargement. Multiple small mediastinal lymph nodes. Limited assessment for hilar adenopathy given lack contrast. No pericardial effusion Lungs/Pleura: Right upper lobe fibrosis with small fiducial markers again seen. Irregular density is increased in the interim currently measuring 2.3 x 1.6 cm, previously 1.3 x 0.7 cm. Additionally curvilinear opacity extending towards the apex has also increased Small to moderate bilateral pleural effusions and compressive atelectasis in both lower lobes, left greater than right. Fluid tracking in the fissures bilaterally. Tiny pleural-based nodule in the right upper lobe measures 5 mm, increase conspicuity compared to prior, series 3, image 67. Left upper lobe opacity with air bronchogram and both confluent and ground-glass components image 67 series 3. Ground-glass component measures 1.6 x 1.6 cm. There is mild smooth septal thickening in the lower lobes. Trachea and bronchi are patent with minimal mucous in the trachea on the left. Upper abdomen: No acute abnormality. Cystic lesions in the spleen are again seen, grossly stable. Right adrenal adenoma is again seen. There is thinning of the renal parenchyma bilaterally with renal cysts, incompletely included. Musculoskeletal: No lytic or blastic osseous lesions. No acute abnormality. Minimal  anterior wedging of the mid lower thoracic vertebra are unchanged. IMPRESSION: 1. Moderate size bilateral pleural effusions with compressive atelectasis, smooth septal thickening consistent pulmonary edema, and cardiomegaly. Suspect fluid overload/CHF. 2. Small focal left upper lobe opacity with both confluent ground-glass opacities and internal air bronchogram, may be infectious, inflammatory, or less likely neoplastic. 3. Right apical scarring, patient with history of lung cancer post  radiation. The soft tissue density adjacent to the fiducial markers is increased from prior chest CT of October 2016 and PET-CT of 2015. Additionally there is increased curvilinear density more superiorly extending towards the apex. Differential considerations include delayed postradiation change versus recurrent disease. Superimposed infection is considered but felt less likely. PET-CT may be helpful for evaluation of metabolic activity. 4. Follow-up of the left lung abnormality recommended with chest CT in 3 months, if PET-CT is not performed in the interim. Electronically Signed   By: Jeb Levering M.D.   On: 01/22/2016 00:48   US Renal  01/20/2016  CLINICAL DATA:  82 year old female with acute kidney failure. EXAM: RENAL / URINARY TRACT ULTRASOUND COMPLETE COMPARISON:  06/08/2015 renal ultrasound FINDINGS: Right Kidney: Length: 11 cm. Mildly increased renal echogenicity and mild renal cortical atrophy noted. Multiple renal cysts are noted, the largest measuring 2.9 cm in the mid-upper pole. There is no evidence of solid mass or hydronephrosis. Left Kidney: Length: 12 cm. Mildly increased renal echogenicity and mild renal cortical atrophy noted. Multiple renal cysts are again identified, the largest measuring 2 cm. There is no evidence of solid mass or hydronephrosis. Bladder: Appears normal for degree of bladder distention. IMPRESSION: Mild increased renal echogenicity and renal cortical atrophy likely representing medical renal disease. No evidence of hydronephrosis or solid mass. Multiple renal cysts again noted. Electronically Signed   By: Margarette Canada M.D.   On: 01/20/2016 17:10   Dg Chest Port 1 View  01/23/2016  CLINICAL DATA:  Patient with respiratory failure, CHF and COPD. EXAM: PORTABLE CHEST 1 VIEW COMPARISON:  Chest CT 01/21/2016 FINDINGS: Monitoring leads overlie the patient. Stable enlarged cardiac and mediastinal contours. Persistent small partially layering bilateral pleural effusions with  underlying basilar opacities favored to represent atelectasis. Unchanged focal consolidation within the right suprahilar location. No pneumothorax. IMPRESSION: Stable cardiomegaly. Layering small bilateral pleural effusions and underlying atelectasis. Persistent right upper lobe consolidation. Electronically Signed   By: Lovey Newcomer M.D.   On: 01/23/2016 09:20   Dg Chest Port 1 View  01/20/2016  CLINICAL DATA:  Shortness of breath for 1 week. History of lung cancer, COPD and congestive heart failure. EXAM: PORTABLE CHEST 1 VIEW COMPARISON:  01/19/2016 and 11/24/2015. FINDINGS: 1812 hours. There is stable cardiomegaly, vascular congestion and aortic atherosclerosis. There are stable postsurgical changes in the right suprahilar region with associated asymmetric density. There is stable blunting of both costophrenic angles. No definite superimposed airspace disease or significant pleural effusion. IMPRESSION: Stable cardiomegaly, vascular congestion and possible mild edema. No focal airspace disease. Electronically Signed   By: Richardean Sale M.D.   On: 01/20/2016 18:29     Assessment and Plan  82 y.o. female   1)  Paroxysmal atrial fibrillation Converted back to atrial fibrillation yesterday morning Still in atrial fibrillation today --Recommend we increase amiodarone back to 1 mg/min in an effort to restore normal sinus rhythm Long discussion with her concerning anticoagulation As prior coronary stent is over 27-year-old,  ----would recommend holding aspirin and Plavix,  --- Consider starting heparin infusion once right femoral line has been removed If  she tolerates heparin, could then changed to eliquis 5 mg twice a day  Will not use digoxin given prior history of dig toxicity  2) Acute renal failure  History of chronic diastolic CHF Given worsening renal function, would hold Lasix Much of her shortness of breath is secondary to upper respiratory infection, COPD exacerbation -Consider epogen  for anemia  3)Respiratory failure Multifactorial including COPD, bronchitis  may need steroids, broad antibiotics Less likely acute diastolic CHF given prerenal state on arrival  4)Anemia Chronic issue, hematocrit 25 Prior history of bleeding If heparin and eventually eliquis is started, will need to monitor closely   Signed, Esmond Plants, MD, Ph.D. [facility] 01/24/2016, 2:05 PM

## 2016-01-25 DIAGNOSIS — I5033 Acute on chronic diastolic (congestive) heart failure: Secondary | ICD-10-CM

## 2016-01-25 LAB — CBC
HEMATOCRIT: 27.4 % — AB (ref 35.0–47.0)
Hemoglobin: 9.1 g/dL — ABNORMAL LOW (ref 12.0–16.0)
MCH: 25.5 pg — ABNORMAL LOW (ref 26.0–34.0)
MCHC: 33.2 g/dL (ref 32.0–36.0)
MCV: 76.8 fL — ABNORMAL LOW (ref 80.0–100.0)
Platelets: 212 10*3/uL (ref 150–440)
RBC: 3.57 MIL/uL — ABNORMAL LOW (ref 3.80–5.20)
RDW: 16.5 % — AB (ref 11.5–14.5)
WBC: 8.7 10*3/uL (ref 3.6–11.0)

## 2016-01-25 LAB — BASIC METABOLIC PANEL
ANION GAP: 10 (ref 5–15)
BUN: 77 mg/dL — AB (ref 6–20)
CALCIUM: 8.5 mg/dL — AB (ref 8.9–10.3)
CO2: 32 mmol/L (ref 22–32)
Chloride: 94 mmol/L — ABNORMAL LOW (ref 101–111)
Creatinine, Ser: 2 mg/dL — ABNORMAL HIGH (ref 0.44–1.00)
GFR calc Af Amer: 26 mL/min — ABNORMAL LOW (ref >60)
GFR, EST NON AFRICAN AMERICAN: 22 mL/min — AB (ref >60)
Glucose, Bld: 158 mg/dL — ABNORMAL HIGH (ref 65–99)
POTASSIUM: 3.9 mmol/L (ref 3.5–5.1)
SODIUM: 136 mmol/L (ref 135–145)

## 2016-01-25 LAB — GLUCOSE, CAPILLARY
GLUCOSE-CAPILLARY: 121 mg/dL — AB (ref 65–99)
GLUCOSE-CAPILLARY: 156 mg/dL — AB (ref 65–99)
GLUCOSE-CAPILLARY: 111 mg/dL — AB (ref 65–99)
Glucose-Capillary: 177 mg/dL — ABNORMAL HIGH (ref 65–99)

## 2016-01-25 MED ORDER — SODIUM CHLORIDE 0.9% FLUSH
10.0000 mL | Freq: Two times a day (BID) | INTRAVENOUS | Status: DC
Start: 2016-01-25 — End: 2016-01-27
  Administered 2016-01-25 – 2016-01-27 (×4): 10 mL

## 2016-01-25 MED ORDER — APIXABAN 2.5 MG PO TABS
2.5000 mg | ORAL_TABLET | Freq: Two times a day (BID) | ORAL | Status: DC
  Administered 2016-01-25 – 2016-01-27 (×5): 2.5 mg via ORAL
  Filled 2016-01-25 (×5): qty 1

## 2016-01-25 MED ORDER — METOPROLOL SUCCINATE ER 25 MG PO TB24
25.0000 mg | ORAL_TABLET | Freq: Two times a day (BID) | ORAL | Status: DC
  Administered 2016-01-25 – 2016-01-27 (×5): 25 mg via ORAL
  Filled 2016-01-25 (×5): qty 1

## 2016-01-25 MED ORDER — SODIUM CHLORIDE 0.9% FLUSH: 10.0000 mL | INTRAVENOUS | Status: DC | PRN

## 2016-01-25 MED ORDER — FUROSEMIDE 20 MG PO TABS
20.0000 mg | ORAL_TABLET | Freq: Every day | ORAL | Status: DC
  Administered 2016-01-26 – 2016-01-27 (×2): 20 mg via ORAL
  Filled 2016-01-25 (×2): qty 1

## 2016-01-25 NOTE — Progress Notes (Signed)
Patient ID: GLORIAN MCDONELL, female   DOB: 1933-06-17, 82 y.o.   MRN: [id number] [facility] PROGRESS NOTE  ARLISSA MONTEVERDE YWV:[id number] DOB: 05-09-1933 DOA: 01/19/2016 PCP: Tracie Harrier, MD  HPI/Subjective: Patient feeling better. Still with some cough and congestion. Legs less swollen.  Objective: Filed Vitals:   01/25/16 0953 01/25/16 1159  BP: 117/63 112/69  Pulse: 93 112  Temp:  98.2 F (36.8 C)  Resp:  18    Filed Weights   01/19/16 1507 01/19/16 2111  Weight: 83.008 kg (183 lb) 83.416 kg (183 lb 14.4 oz)    ROS: Review of Systems  Constitutional: Negative for fever and chills.  Eyes: Negative for blurred vision.  Respiratory: Positive for cough, shortness of breath and wheezing.   Cardiovascular: Negative for chest pain.  Gastrointestinal: Negative for nausea, vomiting, abdominal pain, diarrhea and constipation.  Genitourinary: Negative for dysuria.  Musculoskeletal: Negative for joint pain.  Neurological: Negative for dizziness and headaches.   Exam: Physical Exam  Constitutional: She is oriented to person, place, and time.  HENT:  Nose: No mucosal edema.  Mouth/Throat: No oropharyngeal exudate or posterior oropharyngeal edema.  Eyes: Conjunctivae, EOM and lids are normal. Pupils are equal, round, and reactive to light.  Neck: No JVD present. Carotid bruit is not present. No edema present. No thyroid mass and no thyromegaly present.  Cardiovascular: S1 normal and S2 normal.  An irregularly irregular rhythm present. Tachycardia present.  Exam reveals no gallop.   No murmur heard. Pulses:      Dorsalis pedis pulses are 2+ on the right side, and 2+ on the left side.  Respiratory: No accessory muscle usage. No respiratory distress. She has decreased breath sounds in the right lower field and the left lower field. She has no wheezes. She has no rhonchi. She has no rales.  GI: Soft. Bowel sounds are normal. There is no tenderness.  Musculoskeletal:        Right ankle: She exhibits swelling.       Left ankle: She exhibits swelling.  Lymphadenopathy:    She has no cervical adenopathy.  Neurological: She is alert and oriented to person, place, and time. No cranial nerve deficit.  Skin: Skin is warm. No rash noted. Nails show no clubbing.  Psychiatric: She has a normal mood and affect.      Data Reviewed: Basic Metabolic Panel:  Recent Labs Lab 01/20/16 0459 01/21/16 0910 01/22/16 0010 01/23/16 0509 01/25/16 0540  NA 138 138 138 138 136  K 4.9 4.4 5.2* 4.2 3.9  CL 100* 102 100* 94* 94*  CO2 '28 27 30 '$ 34* 32  GLUCOSE 277* 183* 145* 112* 158*  BUN 84* 68* 79* 82* 77*  CREATININE 2.60* 1.96* 2.61* 2.48* 2.00*  CALCIUM 8.1* 8.7* 8.5* 8.6* 8.5*  MG  --   --   --  2.0  --    Liver Function Tests:  Recent Labs Lab 01/19/16 1358 01/19/16 1515  AST 43* 41  ALT 48 46  ALKPHOS 48 41  BILITOT 0.7 0.5  PROT 7.9 7.3  ALBUMIN 4.6 4.3   CBC:  Recent Labs Lab 01/19/16 1358 01/19/16 1515 01/20/16 0459 01/22/16 0010 01/25/16 0540  WBC 6.1 5.5 3.6 7.0 8.7  NEUTROABS 4.8 4.2  --   --   --   HGB 10.4* 9.8* 8.2* 8.0* 9.1*  HCT 31.3* 29.6* 25.3* 24.6* 27.4*  MCV 77.7* 77.2* 79.2* 79.4* 76.8*  PLT 239 218 175 188 212   Cardiac Enzymes:  Recent Labs  Lab 01/19/16 1515 01/21/16 1228 01/21/16 1905 01/22/16 0010  CKTOTAL  --  85  --   --   TROPONINI <0.03 <0.03 <0.03 <0.03   BNP (last 3 results)  Recent Labs  11/24/15 2223 01/19/16 1515 01/21/16 1228  BNP 300.0* 330.0* 943.0*     CBG:  Recent Labs Lab 01/24/16 1128 01/24/16 1642 01/24/16 2101 01/25/16 0719 01/25/16 1130  GLUCAP 234* 225* 122* 121* 177*    Recent Results (from the past 240 hour(s))  Blood culture (routine x 2)     Status: None   Collection Time: 01/19/16  3:15 PM  Result Value Ref Range Status   Specimen Description BLOOD RIGHT WRIST  Final   Special Requests BOTTLES DRAWN AEROBIC AND ANAEROBIC 8CC  Final   Culture NO GROWTH 5 DAYS   Final   Report Status 01/24/2016 FINAL  Final  Blood culture (routine x 2)     Status: None   Collection Time: 01/19/16  4:42 PM  Result Value Ref Range Status   Specimen Description BLOOD LEFT HAND  Final   Special Requests BOTTLES DRAWN AEROBIC AND ANAEROBIC [place]  Final   Culture NO GROWTH 5 DAYS  Final   Report Status 01/24/2016 FINAL  Final  MRSA PCR Screening     Status: None   Collection Time: 01/21/16  3:45 PM  Result Value Ref Range Status   MRSA by PCR NEGATIVE NEGATIVE Final    Comment:        The GeneXpert MRSA Assay (FDA approved for NASAL specimens only), is one component of a comprehensive MRSA colonization surveillance program. It is not intended to diagnose MRSA infection nor to guide or monitor treatment for MRSA infections.      Studies: Dg Chest 1 View  01/24/2016  CLINICAL DATA:  Central line placement EXAM: CHEST 1 VIEW COMPARISON:  Chest radiograph from one day prior. FINDINGS: Right internal jugular central venous catheter terminates at the cavoatrial junction. Surgical clips overlie the right upper chest. Stable cardiomediastinal silhouette with mild cardiomegaly. No pneumothorax. Stable small bilateral pleural effusions, left greater than right. Stable focal irregular opacity in the apical right upper lobe. Stable bibasilar lung opacities, favor atelectasis. Stable borderline mild pulmonary edema. IMPRESSION: 1. Right internal jugular central venous catheter terminates at the cavoatrial junction. No pneumothorax . 2. Stable borderline mild congestive heart failure. 3. Stable small bilateral pleural effusions, left greater than right. 4. Stable bibasilar lung opacities, favor atelectasis. 5. Stable focal irregular opacity in the apical right upper lobe, nonspecific, differential includes posttreatment change or recurrent tumor, please see recent chest CT report for further details. Electronically Signed   By: Ilona Sorrel M.D.   On: 01/24/2016 12:39     Scheduled Meds: . antiseptic oral rinse  7 mL Mouth Rinse q12n4p  . apixaban  2.5 mg Oral BID  . atorvastatin  20 mg Oral QPM  . budesonide (PULMICORT) nebulizer solution  0.25 mg Nebulization BID  . chlorhexidine  15 mL Mouth Rinse BID  . cholecalciferol  800 Units Oral QPM  . diltiazem  240 mg Oral QPM  . febuxostat  40 mg Oral QPM  . ferrous sulfate  325 mg Oral QPM  . [START ON 01/26/2016] furosemide  20 mg Oral Daily  . insulin aspart  0-15 Units Subcutaneous TID WC  . insulin aspart  0-5 Units Subcutaneous QHS  . metoprolol succinate  25 mg Oral BID  . multivitamin-lutein  1 capsule Oral QPM  . pantoprazole  40  mg Oral QPM  . predniSONE  30 mg Oral Q breakfast  . sertraline  50 mg Oral QPM  . sodium chloride flush  3 mL Intravenous Q12H  . cyanocobalamin  500 mcg Oral QPM   Continuous Infusions: . amiodarone 60 mg/hr (01/25/16 1216)    Assessment/Plan:  1. Acute respiratory failure on chronic respiratory failure. Now on nasal cannula. Continue oxygen supplementation and BiPAP at night. Stopped abx. Chest x-ray today showed right upper lobe consolidation which is likely scar tissue from the CAT scan. This is her site of prior lung cancer. 2. Acute diastolic congestive heart failure and Fluid overload. Patient also has bilateral pleural effusions. Nephrology started low-dose Lasix 3. COPD exacerbation with poor air entry. Continue prednisone taper. Continue budesonide nebulizers to her DuoNeb. Stop antibiotics. 4. Atrial fibrillation with rapid ventricular response. Patient on amiodarone drip, metoprolol and Cardizem. Cardiology started eloquent for anticoagulation. 5. Acute kidney injury on chronic kidney disease. Watch closely with diuresis. 6. Hyperkalemia. Nephrology to consider new medication 7. Anemia of chronic disease. Watch closely with anticoagulation. 8. Anxiety depression continue psychiatric medications 9. Hyperlipidemia unspecified on atorvastatin 10. GERD  on Protonix  Code Status:     Code Status Orders        Start     Ordered   01/19/16 1957  Limited resuscitation (code)   Continuous    Question Answer Comment  In the event of cardiac or respiratory ARREST: Initiate Code Blue, Call Rapid Response Yes   In the event of cardiac or respiratory ARREST: Perform CPR No   In the event of cardiac or respiratory ARREST: Perform Intubation/Mechanical Ventilation Yes   In the event of cardiac or respiratory ARREST: Use NIPPV/BiPAp only if indicated Yes   In the event of cardiac or respiratory ARREST: Administer ACLS medications if indicated No   In the event of cardiac or respiratory ARREST: Perform Defibrillation or Cardioversion if indicated No      01/19/16 1956    Code Status History    Date Active Date Inactive Code Status Order ID Comments User Context   11/26/2015  4:02 AM 11/26/2015  8:59 PM Full Code [id number]  Silver Huguenin, RN Inpatient   06/14/2015  6:57 PM 06/15/2015  8:54 PM DNR [id number]  Colleen Can, MD Inpatient   06/07/2015 10:20 PM 06/14/2015  6:56 PM Full Code [id number]  Lytle Butte, MD ED   05/15/2015  3:27 PM 05/24/2015  5:04 PM Full Code [id number]  Gladstone Lighter, MD ED   05/08/2015 11:23 AM 05/11/2015  3:13 PM Full Code [id number]  Dustin Flock, MD ED   04/15/2015 12:01 AM 04/15/2015  7:03 PM Full Code [id number]  Lance Coon, MD Inpatient   12/23/2014  1:09 PM 12/24/2014  5:17 PM Full Code [id number]  Burnell Blanks, MD Inpatient   12/23/2014  4:22 AM 12/23/2014  1:09 PM Full Code [id number]  Alwyn Pea, MD ED    Advance Directive Documentation        Most Recent Value   Type of Advance Directive  Healthcare Power of Attorney, Living will   Pre-existing out of facility DNR order (yellow form or pink MOST form)     "MOST" Form in Place?       Disposition Plan: TBD  Consultants:  Critical care specialist  Nephrology  Cardiology  Time spent: 20 minutes  [place], [place]

## 2016-01-25 NOTE — Care Management Important Message (Signed)
Important Message  Patient Details  Name: Regina Burke MRN: [id number] Date of Birth: 1933/02/26   Medicare Important Message Given:  Yes    Jolly Mango, RN 01/25/2016, 8:31 AM

## 2016-01-25 NOTE — Progress Notes (Signed)
[facility]  ROUNDING NOTE   Subjective:  Sitting in chair.  Creatinine 2 (2.48) K 3.9 (4.2)  Furosemide 78m IV - currently off metolazone.   Objective:  Vital signs in last 24 hours:  Temp:  [97.9 F (36.6 C)-98.3 F (36.8 C)] 98.3 F (36.8 C) (05/30 0434) Pulse Rate:  [88-123] 93 (05/30 0953) Resp:  [16-18] 16 (05/30 0434) BP: (108-145)/(63-76) 117/63 mmHg (05/30 0953) SpO2:  [90 %-98 %] 97 % (05/30 0953) FiO2 (%):  [21 %] 21 % (05/30 0747)  Weight change:  Filed Weights   01/19/16 1507 01/19/16 2111  Weight: 83.008 kg (183 lb) 83.416 kg (183 lb 14.4 oz)    Intake/Output: I/O last 3 completed shifts: In: 816[P.O.:480; I.V.:400] Out: 1700 [Urine:1700]   Intake/Output this shift:  Total I/O In: 240 [P.O.:240] Out: -   Physical Exam: General: No acute distress   Head: Normocephalic, atraumatic. Moist oral mucosal membranes  Eyes: Anicteric  Neck: Supple, trachea midline  Lungs:  Diminished breath sounds at the bases, normal effort   Heart: Irregular, no rubs  Abdomen:  Soft, nontender, BS present  Extremities: No peripheral edema.  Neurologic: Nonfocal, moving all four extremities  Skin: No lesions       Basic Metabolic Panel:  Recent Labs Lab 01/20/16 0459 01/21/16 0910 01/22/16 0010 01/23/16 0509 01/25/16 0540  NA 138 138 138 138 136  K 4.9 4.4 5.2* 4.2 3.9  CL 100* 102 100* 94* 94*  CO2 _0 34* 32  GLUCOSE 277* 183* 145* 112* 158*  BUN 84* 68* 79* 82* 77*  CREATININE 2.60* 1.96* 2.61* 2.48* 2.00*  CALCIUM 8.1* 8.7* 8.5* 8.6* 8.5*  MG  --   --   --  2.0  --     Liver Function Tests:  Recent Labs Lab 01/19/16 1358 01/19/16 1515  AST 43* 41  ALT 48 46  ALKPHOS 48 41  BILITOT 0.7 0.5  PROT 7.9 7.3  ALBUMIN 4.6 4.3   No results for input(s): LIPASE, AMYLASE in the last 168 hours. No results for input(s): AMMONIA in the last 168 hours.  CBC:  Recent Labs Lab 01/19/16 1358 01/19/16 1515 01/20/16 0459  01/22/16 0010 01/25/16 0540  WBC 6.1 5.5 3.6 7.0 8.7  NEUTROABS 4.8 4.2  --   --   --   HGB 10.4* 9.8* 8.2* 8.0* 9.1*  HCT 31.3* 29.6* 25.3* 24.6* 27.4*  MCV 77.7* 77.2* 79.2* 79.4* 76.8*  PLT 239 218 175 188 212    Cardiac Enzymes:  Recent Labs Lab 01/19/16 1515 01/21/16 1228 01/21/16 1905 01/22/16 0010  CKTOTAL  --  85  --   --   TROPONINI <0.03 <0.03 <0.03 <0.03    BNP: Invalid input(s): POCBNP  CBG:  Recent Labs Lab 01/24/16 0716 01/24/16 1128 01/24/16 1642 01/24/16 2101 01/25/16 0719  GLUCAP 115* 234* 225* 122* 121*    Microbiology: Results for orders placed or performed during the hospital encounter of 01/19/16  Blood culture (routine x 2)     Status: None   Collection Time: 01/19/16  3:15 PM  Result Value Ref Range Status   Specimen Description BLOOD RIGHT WRIST  Final   Special Requests BOTTLES DRAWN AEROBIC AND ANAEROBIC 8CC  Final   Culture NO GROWTH 5 DAYS  Final   Report Status 01/24/2016 FINAL  Final  Blood culture (routine x 2)     Status: None   Collection Time: 01/19/16  4:42 PM  Result Value Ref Range Status  Specimen Description BLOOD LEFT HAND  Final   Special Requests BOTTLES DRAWN AEROBIC AND ANAEROBIC [place]  Final   Culture NO GROWTH 5 DAYS  Final   Report Status 01/24/2016 FINAL  Final  MRSA PCR Screening     Status: None   Collection Time: 01/21/16  3:45 PM  Result Value Ref Range Status   MRSA by PCR NEGATIVE NEGATIVE Final    Comment:        The GeneXpert MRSA Assay (FDA approved for NASAL specimens only), is one component of a comprehensive MRSA colonization surveillance program. It is not intended to diagnose MRSA infection nor to guide or monitor treatment for MRSA infections.     Coagulation Studies: No results for input(s): LABPROT, INR in the last 72 hours.  Urinalysis: No results for input(s): COLORURINE, LABSPEC, PHURINE, GLUCOSEU, HGBUR, BILIRUBINUR, KETONESUR, PROTEINUR, UROBILINOGEN, NITRITE,  LEUKOCYTESUR in the last 72 hours.  Invalid input(s): APPERANCEUR    Imaging: Dg Chest 1 View  01/24/2016  CLINICAL DATA:  Central line placement EXAM: CHEST 1 VIEW COMPARISON:  Chest radiograph from one day prior. FINDINGS: Right internal jugular central venous catheter terminates at the cavoatrial junction. Surgical clips overlie the right upper chest. Stable cardiomediastinal silhouette with mild cardiomegaly. No pneumothorax. Stable small bilateral pleural effusions, left greater than right. Stable focal irregular opacity in the apical right upper lobe. Stable bibasilar lung opacities, favor atelectasis. Stable borderline mild pulmonary edema. IMPRESSION: 1. Right internal jugular central venous catheter terminates at the cavoatrial junction. No pneumothorax . 2. Stable borderline mild congestive heart failure. 3. Stable small bilateral pleural effusions, left greater than right. 4. Stable bibasilar lung opacities, favor atelectasis. 5. Stable focal irregular opacity in the apical right upper lobe, nonspecific, differential includes posttreatment change or recurrent tumor, please see recent chest CT report for further details. Electronically Signed   By: Ilona Sorrel M.D.   On: 01/24/2016 12:39     Medications:   . amiodarone 60 mg/hr (01/24/16 1826)   . antiseptic oral rinse  7 mL Mouth Rinse q12n4p  . apixaban  2.5 mg Oral BID  . atorvastatin  20 mg Oral QPM  . budesonide (PULMICORT) nebulizer solution  0.25 mg Nebulization BID  . chlorhexidine  15 mL Mouth Rinse BID  . cholecalciferol  800 Units Oral QPM  . diltiazem  240 mg Oral QPM  . febuxostat  40 mg Oral QPM  . ferrous sulfate  325 mg Oral QPM  . furosemide  40 mg Intravenous Daily  . insulin aspart  0-15 Units Subcutaneous TID WC  . insulin aspart  0-5 Units Subcutaneous QHS  . metoprolol succinate  25 mg Oral BID  . multivitamin-lutein  1 capsule Oral QPM  . pantoprazole  40 mg Oral QPM  . predniSONE  30 mg Oral Q  breakfast  . sertraline  50 mg Oral QPM  . sodium chloride flush  3 mL Intravenous Q12H  . cyanocobalamin  500 mcg Oral QPM   ALPRAZolam, labetalol, nitroGLYCERIN, ondansetron  Assessment/ Plan:  82 y.o. female with diabetes mellitus type II, coronary artery disease, hypertension, hyperlipidemia, generalized anxiety disorder, gout, anemia, history of lung cancer, COPD, atrial fibrillation, fatty liver disease, GERD, osteoporosis   1. Acute renal failure on Chronic kidney disease stage III with hyperkalemia and proteinuria: Endocrinology has ruled out hypoaldosteronism and believes her hyperkalemia is due to her renal failure.  Baseline creatinine of 1.5, eGFR of 32 from 11/03/15.  Acute renal failure likely due to poor PO  intake plus diuretics.  - restart home diuretics: furosemide 24m daily. Hold metolazone.   2. Hypertension: well controlled. Not on an ACE-I/ARB due to hyperkalemia. With atrial fibrillation. - diltiazem and metoprolol.   3. Anemia with chronic kidney disease: Hemoglobin 9.1 - no history of epo use.    LOS: 6 Regina Burke 5/30/201711:30 AM

## 2016-01-25 NOTE — Progress Notes (Signed)
Patient OOB sitting in the recliner, amiodarone drip at 33.33 ml still  Infusing HR 105, family at bedside,  No distress noted.

## 2016-01-25 NOTE — Progress Notes (Signed)
Inpatient Diabetes Program Recommendations  AACE/ADA: New Consensus Statement on Inpatient Glycemic Control (2015)  Target Ranges:  Prepandial:   less than 140 mg/dL      Peak postprandial:   less than 180 mg/dL (1-2 hours)      Critically ill patients:  140 - 180 mg/dL  Results for DORATHA, MCSWAIN (MRN [id number]) as of 01/25/2016 10:50  Ref. Range 01/24/2016 07:16 01/24/2016 11:28 01/24/2016 16:42 01/24/2016 21:01 01/25/2016 07:19  Glucose-Capillary Latest Ref Range: 65-99 mg/dL 115 (H) 234 (H) 225 (H) 122 (H) 121 (H)   Review of Glycemic Control  Diabetes history: No Outpatient Diabetes medications: NA Current orders for Inpatient glycemic control: Novolog 0-15 units TID with meals, Novolog 0-5 units QHS  Inpatient Diabetes Program Recommendations: Insulin - Meal Coverage: While inpatient and ordered steroids, may want to consider ordering Novolog 2 units TID with meals for meal coverage if patient eats at least 50% of meals.  Thanks, Barnie Alderman, RN, MSN, CDE Diabetes Coordinator Inpatient Diabetes Program [phone] (Team Pager from [place] to [place]) [phone] ([facility] office) [phone] [employer] office) [phone] [facility] office)

## 2016-01-25 NOTE — Care Management (Signed)
Eliquis coupon given

## 2016-01-25 NOTE — Progress Notes (Addendum)
Patient: Regina Burke / Admit Date: 01/19/2016 / Date of Encounter: 01/25/2016, 7:16 AM   Subjective: No acute overnight events. Cough is now productive of thick, white sputum. Some palpitations. No chest pain. SOB improving. Remains on full-dose amiodarone infusion this morning.   Review of Systems: Review of Systems  Constitutional: Positive for weight loss and malaise/fatigue. Negative for fever, chills and diaphoresis.  HENT: Negative for congestion.   Eyes: Negative for discharge and redness.  Respiratory: Positive for cough, sputum production and shortness of breath. Negative for hemoptysis and wheezing.        Thick white sputum  Cardiovascular: Positive for palpitations. Negative for chest pain, orthopnea, claudication, leg swelling and PND.  Gastrointestinal: Negative for nausea, vomiting and abdominal pain.  Musculoskeletal: Negative for falls.  Skin: Negative for rash.  Neurological: Positive for weakness. Negative for dizziness, sensory change, speech change, focal weakness and loss of consciousness.  Endo/Heme/Allergies: Does not bruise/bleed easily.  Psychiatric/Behavioral: The patient is not nervous/anxious.   All other systems reviewed and are negative.    Objective: Telemetry: Afib, 90's to los 100's this morning. Periods of Afib with RVR in to the 120's to 140's in the PM of 01/24/16 Physical Exam: Blood pressure 140/76, pulse 88, temperature 98.3 F (36.8 C), temperature source Oral, resp. rate 16, height '5\' 4"'$  (1.626 m), weight 183 lb 14.4 oz (83.416 kg), SpO2 92 %. Body mass index is 31.55 kg/(m^2). General: Well developed, well nourished, in no acute distress. Head: Normocephalic, atraumatic, sclera non-icteric, no xanthomas, nares are without discharge. Neck: Negative for carotid bruits. JVP not elevated. Lungs: Mildly decreased breath sounds bilaterally. Breathing is unlabored. Heart: Irregularly-irregular, S1 S2 without murmurs, rubs, or gallops.    Abdomen: Soft, non-tender, non-distended with normoactive bowel sounds. No rebound/guarding. Extremities: No clubbing or cyanosis. No edema. Distal pedal pulses are 2+ and equal bilaterally. Neuro: Alert and oriented X 3. Moves all extremities spontaneously. Psych:  Responds to questions appropriately with a normal affect.   Intake/Output Summary (Last 24 hours) at 01/25/16 0716 Last data filed at 01/24/16 2009  Gross per 24 hour  Intake    880 ml  Output   1700 ml  Net   -820 ml    Inpatient Medications:  . antiseptic oral rinse  7 mL Mouth Rinse q12n4p  . aspirin EC  81 mg Oral QPM  . atorvastatin  20 mg Oral QPM  . budesonide (PULMICORT) nebulizer solution  0.25 mg Nebulization BID  . chlorhexidine  15 mL Mouth Rinse BID  . cholecalciferol  800 Units Oral QPM  . clopidogrel  75 mg Oral QPM  . diltiazem  240 mg Oral QPM  . febuxostat  40 mg Oral QPM  . ferrous sulfate  325 mg Oral QPM  . furosemide  40 mg Intravenous Daily  . heparin  5,000 Units Subcutaneous Q8H  . insulin aspart  0-15 Units Subcutaneous TID WC  . insulin aspart  0-5 Units Subcutaneous QHS  . metoprolol succinate  25 mg Oral QPM  . multivitamin-lutein  1 capsule Oral QPM  . pantoprazole  40 mg Oral QPM  . predniSONE  30 mg Oral Q breakfast  . sertraline  50 mg Oral QPM  . sodium chloride flush  3 mL Intravenous Q12H  . cyanocobalamin  500 mcg Oral QPM   Infusions:  . amiodarone 60 mg/hr (01/24/16 1826)    Labs:  Recent Labs  01/23/16 0509 01/25/16 0540  NA 138 136  K 4.2  3.9  CL 94* 94*  CO2 34* 32  GLUCOSE 112* 158*  BUN 82* 77*  CREATININE 2.48* 2.00*  CALCIUM 8.6* 8.5*  MG 2.0  --    No results for input(s): AST, ALT, ALKPHOS, BILITOT, PROT, ALBUMIN in the last 72 hours.  Recent Labs  01/25/16 0540  WBC 8.7  HGB 9.1*  HCT 27.4*  MCV 76.8*  PLT 212   No results for input(s): CKTOTAL, CKMB, TROPONINI in the last 72 hours. Invalid input(s): POCBNP No results for input(s):  HGBA1C in the last 72 hours.   Weights: Filed Weights   01/19/16 1507 01/19/16 2111  Weight: 183 lb (83.008 kg) 183 lb 14.4 oz (83.416 kg)     Radiology/Studies:  Dg Chest 1 View  01/24/2016  CLINICAL DATA:  Central line placement EXAM: CHEST 1 VIEW COMPARISON:  Chest radiograph from one day prior. FINDINGS: Right internal jugular central venous catheter terminates at the cavoatrial junction. Surgical clips overlie the right upper chest. Stable cardiomediastinal silhouette with mild cardiomegaly. No pneumothorax. Stable small bilateral pleural effusions, left greater than right. Stable focal irregular opacity in the apical right upper lobe. Stable bibasilar lung opacities, favor atelectasis. Stable borderline mild pulmonary edema. IMPRESSION: 1. Right internal jugular central venous catheter terminates at the cavoatrial junction. No pneumothorax . 2. Stable borderline mild congestive heart failure. 3. Stable small bilateral pleural effusions, left greater than right. 4. Stable bibasilar lung opacities, favor atelectasis. 5. Stable focal irregular opacity in the apical right upper lobe, nonspecific, differential includes posttreatment change or recurrent tumor, please see recent chest CT report for further details. Electronically Signed   By: Ilona Sorrel M.D.   On: 01/24/2016 12:39   Dg Chest 2 View  01/19/2016  CLINICAL DATA:  Dyspnea, hypoxia EXAM: CHEST  2 VIEW COMPARISON:  11/24/2015 and 01/12/2016 FINDINGS: Cardiomediastinal silhouette is stable. Again noted scarring and surgical clips in right upper lobe medially. No acute infiltrate or pleural effusion. No pulmonary edema. Osteopenia and mild degenerative changes thoracic spine. Hyperinflation again noted. IMPRESSION: No active disease. Hyperinflation again noted. Stable scarring and surgical clips in right upper lobe medially. Osteopenia and mild degenerative changes thoracic spine. Electronically Signed   By: Lahoma Crocker M.D.   On: 01/19/2016  16:28   Ct Chest Wo Contrast  01/22/2016  CLINICAL DATA:  Respiratory failure. Shortness of breath for 1 week. EXAM: CT CHEST WITHOUT CONTRAST TECHNIQUE: Multidetector CT imaging of the chest was performed following the standard protocol without IV contrast. COMPARISON:  Radiographs 01/20/2016. Most recent chest CT 06/08/2015 FINDINGS: Mediastinum/Lymph Nodes: Atherosclerotic calcification of the thoracic aorta. Coronary artery calcifications. Multi chamber cardiac enlargement. Multiple small mediastinal lymph nodes. Limited assessment for hilar adenopathy given lack contrast. No pericardial effusion Lungs/Pleura: Right upper lobe fibrosis with small fiducial markers again seen. Irregular density is increased in the interim currently measuring 2.3 x 1.6 cm, previously 1.3 x 0.7 cm. Additionally curvilinear opacity extending towards the apex has also increased Small to moderate bilateral pleural effusions and compressive atelectasis in both lower lobes, left greater than right. Fluid tracking in the fissures bilaterally. Tiny pleural-based nodule in the right upper lobe measures 5 mm, increase conspicuity compared to prior, series 3, image 67. Left upper lobe opacity with air bronchogram and both confluent and ground-glass components image 67 series 3. Ground-glass component measures 1.6 x 1.6 cm. There is mild smooth septal thickening in the lower lobes. Trachea and bronchi are patent with minimal mucous in the trachea on the left.  Upper abdomen: No acute abnormality. Cystic lesions in the spleen are again seen, grossly stable. Right adrenal adenoma is again seen. There is thinning of the renal parenchyma bilaterally with renal cysts, incompletely included. Musculoskeletal: No lytic or blastic osseous lesions. No acute abnormality. Minimal anterior wedging of the mid lower thoracic vertebra are unchanged. IMPRESSION: 1. Moderate size bilateral pleural effusions with compressive atelectasis, smooth septal  thickening consistent pulmonary edema, and cardiomegaly. Suspect fluid overload/CHF. 2. Small focal left upper lobe opacity with both confluent ground-glass opacities and internal air bronchogram, may be infectious, inflammatory, or less likely neoplastic. 3. Right apical scarring, patient with history of lung cancer post radiation. The soft tissue density adjacent to the fiducial markers is increased from prior chest CT of October 2016 and PET-CT of 2015. Additionally there is increased curvilinear density more superiorly extending towards the apex. Differential considerations include delayed postradiation change versus recurrent disease. Superimposed infection is considered but felt less likely. PET-CT may be helpful for evaluation of metabolic activity. 4. Follow-up of the left lung abnormality recommended with chest CT in 3 months, if PET-CT is not performed in the interim. Electronically Signed   By: Jeb Levering M.D.   On: 01/22/2016 00:48   US Renal  01/20/2016  CLINICAL DATA:  82 year old female with acute kidney failure. EXAM: RENAL / URINARY TRACT ULTRASOUND COMPLETE COMPARISON:  06/08/2015 renal ultrasound FINDINGS: Right Kidney: Length: 11 cm. Mildly increased renal echogenicity and mild renal cortical atrophy noted. Multiple renal cysts are noted, the largest measuring 2.9 cm in the mid-upper pole. There is no evidence of solid mass or hydronephrosis. Left Kidney: Length: 12 cm. Mildly increased renal echogenicity and mild renal cortical atrophy noted. Multiple renal cysts are again identified, the largest measuring 2 cm. There is no evidence of solid mass or hydronephrosis. Bladder: Appears normal for degree of bladder distention. IMPRESSION: Mild increased renal echogenicity and renal cortical atrophy likely representing medical renal disease. No evidence of hydronephrosis or solid mass. Multiple renal cysts again noted. Electronically Signed   By: Margarette Canada M.D.   On: 01/20/2016 17:10   Dg  Chest Port 1 View  01/23/2016  CLINICAL DATA:  Patient with respiratory failure, CHF and COPD. EXAM: PORTABLE CHEST 1 VIEW COMPARISON:  Chest CT 01/21/2016 FINDINGS: Monitoring leads overlie the patient. Stable enlarged cardiac and mediastinal contours. Persistent small partially layering bilateral pleural effusions with underlying basilar opacities favored to represent atelectasis. Unchanged focal consolidation within the right suprahilar location. No pneumothorax. IMPRESSION: Stable cardiomegaly. Layering small bilateral pleural effusions and underlying atelectasis. Persistent right upper lobe consolidation. Electronically Signed   By: Lovey Newcomer M.D.   On: 01/23/2016 09:20   Dg Chest Port 1 View  01/20/2016  CLINICAL DATA:  Shortness of breath for 1 week. History of lung cancer, COPD and congestive heart failure. EXAM: PORTABLE CHEST 1 VIEW COMPARISON:  01/19/2016 and 11/24/2015. FINDINGS: 1812 hours. There is stable cardiomegaly, vascular congestion and aortic atherosclerosis. There are stable postsurgical changes in the right suprahilar region with associated asymmetric density. There is stable blunting of both costophrenic angles. No definite superimposed airspace disease or significant pleural effusion. IMPRESSION: Stable cardiomegaly, vascular congestion and possible mild edema. No focal airspace disease. Electronically Signed   By: Richardean Sale M.D.   On: 01/20/2016 18:29     Assessment and Plan  Principal Problem:   Acute on chronic renal failure (HCC) Active Problems:   Hyperkalemia   Chronic obstructive pulmonary disease with acute exacerbation ([place])  SOB (shortness of breath)   Paroxysmal atrial fibrillation with rapid ventricular response (HCC)   Iron deficiency anemia due to chronic blood loss   Essential hypertension   Acute on chronic diastolic heart failure ([place])    1.PAF: -Remains in rate controlled Afib this morning with heart rates in the 90's -Converted to NSR on  5/28, then back into Afib and has remained there since -Back on full-dose amiodarone infusion since 5/29 -Has not been on heparin infusion given prior right femoral line. This was discontinued on 5/29 with the replacement of right IJ -Would start heparin infusion, if able, and if she tolerates this would transition to Eliquis 5 mg bid and hold aspirin and Plavix as she has not had any stenting in > 1 year -Not on digoxin for rate control given prior history of digoxin toxicity  -On Cardizem CD 240 mg daily and Toprol XL 25 mg daily for rate control  -Will increase Toprol XL to 25 mg bid for added rate control -CHADS2VASc at least 7 (CHF, HTN, age x 2, DM, vascular disease and sex category)  2. Acute on CKD stage III: -SCr improving with the holding of Lasix -Much of her SOB is 2/2 pulmonary including URI and COPD exacerbation, as well as her anemia   -Consider Epogen for her anemia   3. Acute respiratory failure: -As above -Steroids and ABX per IM -Consider nebs, defer to IM. Would use Xopenex over albuterol given the patient's Afib -Less likely acute diastolic CHF  4. Anemia of chronic disease: -Consider Epogen -Maintain hgb > 8.5 -Monitor closely, when/if full-dose anticoagulation  -Per IM   Signed, Christell Faith, PA-C Pager: [phone] 01/25/2016, 7:16 AM    Attending Note Patient seen and examined, agree with detailed note above,  Patient presentation and plan discussed on rounds.   Improving SOB, feel better, Hopes to go home and avoid rehab Son by the bedside Tachycardia last night, unable to tolerate Bipap (pressed on the central line in her neck)  Rales right lung, on [place] oxygen irreg iregg, tachy, abd soft , no edema  Long discussion with her and son about anticoagulation for atrial fib, Risk and benefit discussed with her She is willing to stop aspirin and plavix Start eliquis 5 mg BID  Recommended we wait several weeks for chest congestion to clear before  possible cardioversion -Would continue amiodarone IV at high rate 1 mg/min Orders placed as above  Case discussed with Dr. Earleen Newport  Greater than 50% was spent in counseling and coordination of care with patient Total encounter time 35 minutes or more   Signed: Esmond Plants  M.D., Ph.D. [facility]

## 2016-01-26 LAB — BASIC METABOLIC PANEL
ANION GAP: 7 (ref 5–15)
BUN: 70 mg/dL — AB (ref 6–20)
CHLORIDE: 95 mmol/L — AB (ref 101–111)
CO2: 36 mmol/L — ABNORMAL HIGH (ref 22–32)
Calcium: 8.7 mg/dL — ABNORMAL LOW (ref 8.9–10.3)
Creatinine, Ser: 1.82 mg/dL — ABNORMAL HIGH (ref 0.44–1.00)
GFR calc Af Amer: 29 mL/min — ABNORMAL LOW (ref >60)
GFR, EST NON AFRICAN AMERICAN: 25 mL/min — AB (ref >60)
Glucose, Bld: 103 mg/dL — ABNORMAL HIGH (ref 65–99)
POTASSIUM: 3.9 mmol/L (ref 3.5–5.1)
SODIUM: 138 mmol/L (ref 135–145)

## 2016-01-26 LAB — RENAL FUNCTION PANEL
ANION GAP: 8 (ref 5–15)
Albumin: 3.6 g/dL (ref 3.5–5.0)
BUN: 70 mg/dL — AB (ref 6–20)
CHLORIDE: 95 mmol/L — AB (ref 101–111)
CO2: 35 mmol/L — ABNORMAL HIGH (ref 22–32)
Calcium: 8.7 mg/dL — ABNORMAL LOW (ref 8.9–10.3)
Creatinine, Ser: 1.84 mg/dL — ABNORMAL HIGH (ref 0.44–1.00)
GFR calc Af Amer: 29 mL/min — ABNORMAL LOW (ref >60)
GFR, EST NON AFRICAN AMERICAN: 25 mL/min — AB (ref >60)
GLUCOSE: 102 mg/dL — AB (ref 65–99)
POTASSIUM: 3.9 mmol/L (ref 3.5–5.1)
Phosphorus: 3.5 mg/dL (ref 2.5–4.6)
Sodium: 138 mmol/L (ref 135–145)

## 2016-01-26 LAB — CBC
HEMATOCRIT: 26.9 % — AB (ref 35.0–47.0)
HEMOGLOBIN: 8.9 g/dL — AB (ref 12.0–16.0)
MCH: 25.6 pg — ABNORMAL LOW (ref 26.0–34.0)
MCHC: 33.1 g/dL (ref 32.0–36.0)
MCV: 77.2 fL — AB (ref 80.0–100.0)
Platelets: 189 10*3/uL (ref 150–440)
RBC: 3.49 MIL/uL — ABNORMAL LOW (ref 3.80–5.20)
RDW: 16.2 % — AB (ref 11.5–14.5)
WBC: 7.4 10*3/uL (ref 3.6–11.0)

## 2016-01-26 LAB — GLUCOSE, CAPILLARY
GLUCOSE-CAPILLARY: 117 mg/dL — AB (ref 65–99)
GLUCOSE-CAPILLARY: 340 mg/dL — AB (ref 65–99)
GLUCOSE-CAPILLARY: 149 mg/dL — AB (ref 65–99)
GLUCOSE-CAPILLARY: 139 mg/dL — AB (ref 65–99)

## 2016-01-26 MED ORDER — GI COCKTAIL ~~LOC~~
30.0000 mL | Freq: Once | ORAL | Status: AC
  Administered 2016-01-26: 30 mL via ORAL
  Filled 2016-01-26: qty 30

## 2016-01-26 NOTE — Plan of Care (Signed)
Problem: Cardiac: Goal: Ability to achieve and maintain adequate cardiopulmonary perfusion will improve Outcome: Not Progressing Heart rate is unstable. Patient is on Amiodarone drip at 33.3. Patient is receiving po metoprolol and cardizem. Patient is not symptomatic with palpitations. Heart rate is ranges from 80's-120's.

## 2016-01-26 NOTE — Progress Notes (Signed)
Physical Therapy Treatment Patient Details Name: Regina Burke MRN: [id number] DOB: 01/02/1933 Today's Date: 01/26/2016    History of Present Illness 82 yo female with onset of  weakness and renal failure, now has new HD shunt and is referred to PT to continue rehab.  Her PMHx: lung CA, DM, CAD, COPD, gout, a-fib, anxiety    PT Comments    Pt is making excellent progress with physical therapy. Her elevated HR does not limit activity on this date. She demonstrates good steadiness with gait while using rolling walker. No evidence for instability. Pt denies DOE. HR at rest in room is approximately 105 and due to a-fib rate varies considerably during ambulation but mostly remains around 120 bpm. It does peak at one time to 135 but drops very quickly with standing rest break. SaO2>95% on 3L/min supplemental O2. Pt remains asymmptomatic throughout session. Pt does not need SNF placement at this time and is safe to return home with son and [facility]. She is planning on getting a life alert fall bracelet which I believe is a good idea. Pt will benefit from skilled PT services to address deficits in strength, balance, and mobility in order to return to full function at home.    Follow Up Recommendations  Home health PT     Equipment Recommendations  None recommended by PT    Recommendations for Other Services       Precautions / Restrictions Precautions Precautions: Fall Restrictions Weight Bearing Restrictions: No    Mobility  Bed Mobility Overal bed mobility: Independent             General bed mobility comments: Good speed and sequencing noted with supine to sit transfer. HOB flat and no need for bed rails  Transfers Overall transfer level: Modified independent Equipment used: Rolling walker (2 wheeled)             General transfer comment: Pt demonstrates good speed and sequencing with sit to stand transfer. She is steady and safe in standing with bilateral UE support on  rolling walker. Safe hand placement demonstrated  Ambulation/Gait Ambulation/Gait assistance: Min guard Ambulation Distance (Feet): 200 Feet Assistive device: Rolling walker (2 wheeled) Gait Pattern/deviations: Step-through pattern Gait velocity: WFL for limited community mobility Gait velocity interpretation: at or above normal speed for age/gender General Gait Details: Pt demonstrates good steadiness with gait while using rolling walker. She is able to perform head turns without lateral gait deviations or slowing. No evidence for instability. Vitals and fatigue monitored. Pt denies DOE. HR at rest in room is approximately 105. Due to a-fib rate varies considerably but mostly remains around 120 bpm. It does peak at one time to 135 but drops very quickly with standing rest break. SaO2>95% on 3L/min supplemental O2. Pt remains asymmptomatic throughout session.   Stairs            Wheelchair Mobility    Modified Rankin (Stroke Patients Only)       Balance Overall balance assessment: Needs assistance Sitting-balance support: No upper extremity supported Sitting balance-Leahy Scale: Good     Standing balance support: No upper extremity supported Standing balance-Leahy Scale: Fair Standing balance comment: Fair standing balance in wide stance in standing. Otherwise not formally assessed.                    Cognition Arousal/Alertness: Awake/alert Behavior During Therapy: WFL for tasks assessed/performed Overall Cognitive Status: Within Functional Limits for tasks assessed  Exercises      General Comments        Pertinent Vitals/Pain Pain Assessment: No/denies pain    Home Living                      Prior Function            PT Goals (current goals can now be found in the care plan section) Acute Rehab PT Goals Patient Stated Goal: to feel better PT Goal Formulation: With patient Time For Goal Achievement:  02/07/16 Potential to Achieve Goals: Good Progress towards PT goals: Progressing toward goals    Frequency  Min 2X/week    PT Plan Discharge plan needs to be updated    Co-evaluation             End of Session Equipment Utilized During Treatment: Oxygen Activity Tolerance: Patient tolerated treatment well Patient left: in bed;with call bell/phone within reach;with nursing/sitter in room;Other (comment) (CNA giving bath)     Time: 3559-7416 PT Time Calculation (min) (ACUTE ONLY): 15 min  Charges:  $Therapeutic Exercise: 8-22 mins                    G Codes:      Lyndel Safe Cleland Simkins PT, DPT   Neelah Mannings 01/26/2016, 10:09 AM

## 2016-01-26 NOTE — Progress Notes (Signed)
Patient ID: Regina Burke, female   DOB: 1932/10/28, 82 y.o.   MRN: [id number] [facility] PROGRESS NOTE  Regina Burke:[id number] DOB: [birth year] DOA: 01/19/2016 PCP: Tracie Harrier, MD  HPI/Subjective: Patient feeling a lot better. Breathing better. Still with some cough. Feeling a little bit stronger.  Objective: Filed Vitals:   01/26/16 0843 01/26/16 1109  BP: 111/77 116/56  Pulse: 102 94  Temp: 98.2 F (36.8 C) 98.4 F (36.9 C)  Resp: 18 18    Filed Weights   01/19/16 1507 01/19/16 2111  Weight: 83.008 kg (183 lb) 83.416 kg (183 lb 14.4 oz)    ROS: Review of Systems  Constitutional: Negative for fever and chills.  Eyes: Negative for blurred vision.  Respiratory: Positive for cough and shortness of breath. Negative for wheezing.   Cardiovascular: Negative for chest pain.  Gastrointestinal: Negative for nausea, vomiting, abdominal pain, diarrhea and constipation.  Genitourinary: Negative for dysuria.  Musculoskeletal: Negative for joint pain.  Neurological: Negative for dizziness and headaches.   Exam: Physical Exam  Constitutional: She is oriented to person, place, and time.  HENT:  Nose: No mucosal edema.  Mouth/Throat: No oropharyngeal exudate or posterior oropharyngeal edema.  Eyes: Conjunctivae, EOM and lids are normal. Pupils are equal, round, and reactive to light.  Neck: No JVD present. Carotid bruit is not present. No edema present. No thyroid mass and no thyromegaly present.  Cardiovascular: S1 normal and S2 normal.  An irregularly irregular rhythm present. Exam reveals no gallop.   No murmur heard. Pulses:      Dorsalis pedis pulses are 2+ on the right side, and 2+ on the left side.  Respiratory: No accessory muscle usage. No respiratory distress. She has decreased breath sounds in the right lower field and the left lower field. She has no wheezes. She has no rhonchi. She has no rales.  GI: Soft. Bowel sounds are normal. There is no  tenderness.  Musculoskeletal:       Right ankle: She exhibits swelling.       Left ankle: She exhibits swelling.  Lymphadenopathy:    She has no cervical adenopathy.  Neurological: She is alert and oriented to person, place, and time. No cranial nerve deficit.  Skin: Skin is warm. No rash noted. Nails show no clubbing.  Psychiatric: She has a normal mood and affect.      Data Reviewed: Basic Metabolic Panel:  Recent Labs Lab 01/21/16 0910 01/22/16 0010 01/23/16 0509 01/25/16 0540 01/26/16 0511  NA 138 138 138 136 138  138  K 4.4 5.2* 4.2 3.9 3.9  3.9  CL 102 100* 94* 94* 95*  95*  CO2 27 30 34* 32 35*  36*  GLUCOSE 183* 145* 112* 158* 102*  103*  BUN 68* 79* 82* 77* 70*  70*  CREATININE 1.96* 2.61* 2.48* 2.00* 1.84*  1.82*  CALCIUM 8.7* 8.5* 8.6* 8.5* 8.7*  8.7*  MG  --   --  2.0  --   --   PHOS  --   --   --   --  3.5   Liver Function Tests:  Recent Labs Lab 01/26/16 0511  ALBUMIN 3.6   CBC:  Recent Labs Lab 01/20/16 0459 01/22/16 0010 01/25/16 0540 01/26/16 0511  WBC 3.6 7.0 8.7 7.4  HGB 8.2* 8.0* 9.1* 8.9*  HCT 25.3* 24.6* 27.4* 26.9*  MCV 79.2* 79.4* 76.8* 77.2*  PLT 175 188 212 189   Cardiac Enzymes:  Recent Labs Lab 01/21/16 1228 01/21/16 1905 01/22/16  0010  CKTOTAL 85  --   --   TROPONINI <0.03 <0.03 <0.03   BNP (last 3 results)  Recent Labs  11/24/15 2223 01/19/16 1515 01/21/16 1228  BNP 300.0* 330.0* 943.0*     CBG:  Recent Labs Lab 01/25/16 1130 01/25/16 1619 01/25/16 2110 01/26/16 0718 01/26/16 1111  GLUCAP 177* 156* 111* 117* 340*    Recent Results (from the past 240 hour(s))  Blood culture (routine x 2)     Status: None   Collection Time: 01/19/16  3:15 PM  Result Value Ref Range Status   Specimen Description BLOOD RIGHT WRIST  Final   Special Requests BOTTLES DRAWN AEROBIC AND ANAEROBIC 8CC  Final   Culture NO GROWTH 5 DAYS  Final   Report Status 01/24/2016 FINAL  Final  Blood culture (routine x 2)      Status: None   Collection Time: 01/19/16  4:42 PM  Result Value Ref Range Status   Specimen Description BLOOD LEFT HAND  Final   Special Requests BOTTLES DRAWN AEROBIC AND ANAEROBIC [place]  Final   Culture NO GROWTH 5 DAYS  Final   Report Status 01/24/2016 FINAL  Final  MRSA PCR Screening     Status: None   Collection Time: 01/21/16  3:45 PM  Result Value Ref Range Status   MRSA by PCR NEGATIVE NEGATIVE Final    Comment:        The GeneXpert MRSA Assay (FDA approved for NASAL specimens only), is one component of a comprehensive MRSA colonization surveillance program. It is not intended to diagnose MRSA infection nor to guide or monitor treatment for MRSA infections.      Scheduled Meds: . antiseptic oral rinse  7 mL Mouth Rinse q12n4p  . apixaban  2.5 mg Oral BID  . atorvastatin  20 mg Oral QPM  . budesonide (PULMICORT) nebulizer solution  0.25 mg Nebulization BID  . chlorhexidine  15 mL Mouth Rinse BID  . cholecalciferol  800 Units Oral QPM  . diltiazem  240 mg Oral QPM  . febuxostat  40 mg Oral QPM  . ferrous sulfate  325 mg Oral QPM  . furosemide  20 mg Oral Daily  . insulin aspart  0-15 Units Subcutaneous TID WC  . insulin aspart  0-5 Units Subcutaneous QHS  . metoprolol succinate  25 mg Oral BID  . multivitamin-lutein  1 capsule Oral QPM  . pantoprazole  40 mg Oral QPM  . predniSONE  30 mg Oral Q breakfast  . sertraline  50 mg Oral QPM  . sodium chloride flush  10 mL Intracatheter Q12H  . sodium chloride flush  3 mL Intravenous Q12H  . cyanocobalamin  500 mcg Oral QPM   Continuous Infusions: . amiodarone 30 mg/hr (01/26/16 1515)    Assessment/Plan:  1. Acute respiratory failure on chronic respiratory failure. Now on nasal cannula. Continue oxygen supplementation and BiPAP at night. Stopped abx. Chest x-ray today showed right upper lobe consolidation which is likely scar tissue from the CAT scan. This is her site of prior lung cancer. 2. Acute  diastolic congestive heart failure and Fluid overload. Patient also has bilateral pleural effusions. Nephrology restarted low-dose oral Lasix 3. COPD exacerbation with poor air entry. Continue prednisone taper. Continue budesonide nebulizers to her DuoNeb. Stop antibiotics. 4. Atrial fibrillation with rapid ventricular response. Patient on amiodarone drip, metoprolol and Cardizem. Cardiology started eliquis for anticoagulation. Once patient is converted over to oral amiodarone she should be able to go home. 5. Acute  kidney injury on chronic kidney disease. Creatinine improved to 1.84 today 6. Hyperkalemia. Nephrology to consider new medication 7. Anemia of chronic disease. Watch closely with anticoagulation. 8. Anxiety depression continue psychiatric medications 9. Hyperlipidemia unspecified on atorvastatin 10. GERD on Protonix 11. Weakness. Physical therapy recommended home with home health  Code Status:     Code Status Orders        Start     Ordered   01/19/16 1957  Limited resuscitation (code)   Continuous    Question Answer Comment  In the event of cardiac or respiratory ARREST: Initiate Code Blue, Call Rapid Response Yes   In the event of cardiac or respiratory ARREST: Perform CPR No   In the event of cardiac or respiratory ARREST: Perform Intubation/Mechanical Ventilation Yes   In the event of cardiac or respiratory ARREST: Use NIPPV/BiPAp only if indicated Yes   In the event of cardiac or respiratory ARREST: Administer ACLS medications if indicated No   In the event of cardiac or respiratory ARREST: Perform Defibrillation or Cardioversion if indicated No      01/19/16 1956    Code Status History    Date Active Date Inactive Code Status Order ID Comments User Context   11/26/2015  4:02 AM 11/26/2015  8:59 PM Full Code [id number]  Silver Huguenin, RN Inpatient   06/14/2015  6:57 PM 06/15/2015  8:54 PM DNR [id number]  Colleen Can, MD Inpatient   06/07/2015 10:20 PM 06/14/2015   6:56 PM Full Code [id number]  Lytle Butte, MD ED   05/15/2015  3:27 PM 05/24/2015  5:04 PM Full Code [id number]  Gladstone Lighter, MD ED   05/08/2015 11:23 AM 05/11/2015  3:13 PM Full Code [id number]  Dustin Flock, MD ED   04/15/2015 12:01 AM 04/15/2015  7:03 PM Full Code [id number]  Lance Coon, MD Inpatient   12/23/2014  1:09 PM 12/24/2014  5:17 PM Full Code [id number]  Burnell Blanks, MD Inpatient   12/23/2014  4:22 AM 12/23/2014  1:09 PM Full Code [id number]  Alwyn Pea, MD ED    Advance Directive Documentation        Most Recent Value   Type of Advance Directive  Healthcare Power of Attorney, Living will   Pre-existing out of facility DNR order (yellow form or pink MOST form)     "MOST" Form in Place?       Disposition Plan: Home once converted to oral amiodarone  Consultants:  Critical care specialist  Nephrology  Cardiology  Time spent: 22 minutes  [place], [facility]

## 2016-01-26 NOTE — Progress Notes (Addendum)
Inpatient Diabetes Program Recommendations  AACE/ADA: New Consensus Statement on Inpatient Glycemic Control (2015)  Target Ranges:  Prepandial:   less than 140 mg/dL      Peak postprandial:   less than 180 mg/dL (1-2 hours)      Critically ill patients:  140 - 180 mg/dL   Results for [name], Regina Burke (MRN [id number]) as of 01/26/2016 13:32  Ref. Range 01/26/2016 07:18 01/26/2016 11:11  Glucose-Capillary Latest Ref Range: 65-99 mg/dL 117 (H) 340 (H)    Diabetes history: No  Outpatient DM meds: N/A  Current orders for Inpatient glycemic control: Novolog 0-15 units TID with meals, Novolog 0-5 units QHS    Inpatient Diabetes Program Recommendations: Insulin - Meal Coverage: While inpatient and ordered steroids (Prednisone 30 mg daily), may want to consider ordering Novolog 2 units TID with meals for meal coverage if patient eats at least 50% of meals.     --Will follow patient during hospitalization--  Wyn Quaker RN, MSN, CDE Diabetes Coordinator Inpatient Glycemic Control Team Team Pager: [phone] (8a-5p)

## 2016-01-26 NOTE — Progress Notes (Signed)
[facility]  ROUNDING NOTE   Subjective:   Sitting up in bed.  Creatinine at baseline.   Objective:  Vital signs in last 24 hours:  Temp:  [97.8 F (36.6 C)-98.4 F (36.9 C)] 98.4 F (36.9 C) (05/31 1109) Pulse Rate:  [94-112] 94 (05/31 1109) Resp:  [16-18] 18 (05/31 1109) BP: (107-117)/(48-77) 116/56 mmHg (05/31 1109) SpO2:  [96 %-100 %] 99 % (05/31 1109) FiO2 (%):  [32 %] 32 % (05/31 0757)  Weight change:  Filed Weights   01/19/16 1507 01/19/16 2111  Weight: 83.008 kg (183 lb) 83.416 kg (183 lb 14.4 oz)    Intake/Output: I/O last 3 completed shifts: In: 1118.4 [P.O.:720; I.V.:398.4] Out: 3225 [Urine:3225]   Intake/Output this shift:  Total I/O In: 120 [P.O.:120] Out: -   Physical Exam: General: No acute distress   Head: Normocephalic, atraumatic. Moist oral mucosal membranes  Eyes: Anicteric  Neck: Supple, trachea midline  Lungs:  Diminished breath sounds at the bases, normal effort   Heart: Irregular, no rubs  Abdomen:  Soft, nontender, BS present  Extremities: No peripheral edema.  Neurologic: Nonfocal, moving all four extremities  Skin: No lesions       Basic Metabolic Panel:  Recent Labs Lab 01/21/16 0910 01/22/16 0010 01/23/16 0509 01/25/16 0540 01/26/16 0511  NA 138 138 138 136 138  138  K 4.4 5.2* 4.2 3.9 3.9  3.9  CL 102 100* 94* 94* 95*  95*  CO2 27 30 34* 32 35*  36*  GLUCOSE 183* 145* 112* 158* 102*  103*  BUN 68* 79* 82* 77* 70*  70*  CREATININE 1.96* 2.61* 2.48* 2.00* 1.84*  1.82*  CALCIUM 8.7* 8.5* 8.6* 8.5* 8.7*  8.7*  MG  --   --  2.0  --   --   PHOS  --   --   --   --  3.5    Liver Function Tests:  Recent Labs Lab 01/19/16 1358 01/19/16 1515 01/26/16 0511  AST 43* 41  --   ALT 48 46  --   ALKPHOS 48 41  --   BILITOT 0.7 0.5  --   PROT 7.9 7.3  --   ALBUMIN 4.6 4.3 3.6   No results for input(s): LIPASE, AMYLASE in the last 168 hours. No results for input(s): AMMONIA in the last 168  hours.  CBC:  Recent Labs Lab 01/19/16 1358 01/19/16 1515 01/20/16 0459 01/22/16 0010 01/25/16 0540 01/26/16 0511  WBC 6.1 5.5 3.6 7.0 8.7 7.4  NEUTROABS 4.8 4.2  --   --   --   --   HGB 10.4* 9.8* 8.2* 8.0* 9.1* 8.9*  HCT 31.3* 29.6* 25.3* 24.6* 27.4* 26.9*  MCV 77.7* 77.2* 79.2* 79.4* 76.8* 77.2*  PLT 239 218 175 188 212 189    Cardiac Enzymes:  Recent Labs Lab 01/19/16 1515 01/21/16 1228 01/21/16 1905 01/22/16 0010  CKTOTAL  --  85  --   --   TROPONINI <0.03 <0.03 <0.03 <0.03    BNP: Invalid input(s): POCBNP  CBG:  Recent Labs Lab 01/25/16 1130 01/25/16 1619 01/25/16 2110 01/26/16 0718 01/26/16 1111  GLUCAP 177* 156* 111* 117* 340*    Microbiology: Results for orders placed or performed during the hospital encounter of 01/19/16  Blood culture (routine x 2)     Status: None   Collection Time: 01/19/16  3:15 PM  Result Value Ref Range Status   Specimen Description BLOOD RIGHT WRIST  Final   Special Requests BOTTLES  DRAWN AEROBIC AND ANAEROBIC 8CC  Final   Culture NO GROWTH 5 DAYS  Final   Report Status 01/24/2016 FINAL  Final  Blood culture (routine x 2)     Status: None   Collection Time: 01/19/16  4:42 PM  Result Value Ref Range Status   Specimen Description BLOOD LEFT HAND  Final   Special Requests BOTTLES DRAWN AEROBIC AND ANAEROBIC [place]  Final   Culture NO GROWTH 5 DAYS  Final   Report Status 01/24/2016 FINAL  Final  MRSA PCR Screening     Status: None   Collection Time: 01/21/16  3:45 PM  Result Value Ref Range Status   MRSA by PCR NEGATIVE NEGATIVE Final    Comment:        The GeneXpert MRSA Assay (FDA approved for NASAL specimens only), is one component of a comprehensive MRSA colonization surveillance program. It is not intended to diagnose MRSA infection nor to guide or monitor treatment for MRSA infections.     Coagulation Studies: No results for input(s): LABPROT, INR in the last 72 hours.  Urinalysis: No  results for input(s): COLORURINE, LABSPEC, PHURINE, GLUCOSEU, HGBUR, BILIRUBINUR, KETONESUR, PROTEINUR, UROBILINOGEN, NITRITE, LEUKOCYTESUR in the last 72 hours.  Invalid input(s): APPERANCEUR    Imaging: Dg Chest 1 View  01/24/2016  CLINICAL DATA:  Central line placement EXAM: CHEST 1 VIEW COMPARISON:  Chest radiograph from one day prior. FINDINGS: Right internal jugular central venous catheter terminates at the cavoatrial junction. Surgical clips overlie the right upper chest. Stable cardiomediastinal silhouette with mild cardiomegaly. No pneumothorax. Stable small bilateral pleural effusions, left greater than right. Stable focal irregular opacity in the apical right upper lobe. Stable bibasilar lung opacities, favor atelectasis. Stable borderline mild pulmonary edema. IMPRESSION: 1. Right internal jugular central venous catheter terminates at the cavoatrial junction. No pneumothorax . 2. Stable borderline mild congestive heart failure. 3. Stable small bilateral pleural effusions, left greater than right. 4. Stable bibasilar lung opacities, favor atelectasis. 5. Stable focal irregular opacity in the apical right upper lobe, nonspecific, differential includes posttreatment change or recurrent tumor, please see recent chest CT report for further details. Electronically Signed   By: Ilona Sorrel M.D.   On: 01/24/2016 12:39     Medications:   . amiodarone 30 mg/hr (01/26/16 0848)   . antiseptic oral rinse  7 mL Mouth Rinse q12n4p  . apixaban  2.5 mg Oral BID  . atorvastatin  20 mg Oral QPM  . budesonide (PULMICORT) nebulizer solution  0.25 mg Nebulization BID  . chlorhexidine  15 mL Mouth Rinse BID  . cholecalciferol  800 Units Oral QPM  . diltiazem  240 mg Oral QPM  . febuxostat  40 mg Oral QPM  . ferrous sulfate  325 mg Oral QPM  . furosemide  20 mg Oral Daily  . insulin aspart  0-15 Units Subcutaneous TID WC  . insulin aspart  0-5 Units Subcutaneous QHS  . metoprolol succinate  25 mg  Oral BID  . multivitamin-lutein  1 capsule Oral QPM  . pantoprazole  40 mg Oral QPM  . predniSONE  30 mg Oral Q breakfast  . sertraline  50 mg Oral QPM  . sodium chloride flush  10 mL Intracatheter Q12H  . sodium chloride flush  3 mL Intravenous Q12H  . cyanocobalamin  500 mcg Oral QPM   ALPRAZolam, labetalol, nitroGLYCERIN, ondansetron, sodium chloride flush  Assessment/ Plan:  82 y.o. female with diabetes mellitus type II, coronary artery disease, hypertension, hyperlipidemia, generalized anxiety  disorder, gout, anemia, history of lung cancer, COPD, atrial fibrillation, fatty liver disease, GERD, osteoporosis   1. Acute renal failure on Chronic kidney disease stage III with hyperkalemia and proteinuria: Endocrinology has ruled out hypoaldosteronism and believes her hyperkalemia is due to her renal failure.  Baseline creatinine of 1.5, eGFR of 32 from 11/03/15.  Acute renal failure likely due to poor PO intake plus diuretics.  -  furosemide 38m daily. Hold metolazone.   2. Hypertension: well controlled. Not on an ACE-I/ARB due to hyperkalemia. With atrial fibrillation. - diltiazem and metoprolol.   3. Anemia with chronic kidney disease:  - no history of epo use.    LOS: 7 Linzie Criss 5/31/201711:52 AM

## 2016-01-26 NOTE — Progress Notes (Signed)
Patient: Regina Burke / Admit Date: 01/19/2016 / Date of Encounter: 01/26/2016, 7:41 AM   Subjective: Breathing better. Slept almost fully supine without issues. Using incentive spirometer, which has helped quite a bit. Ambulated in her room on 5/30 without issues. Minus 5 L for the admission. Renal function and hgb stable. Started on Eliquis 2.5 mg bid on 5/30 (age and SCr).   Review of Systems: Review of Systems  Constitutional: Positive for weight loss and malaise/fatigue. Negative for fever, chills and diaphoresis.  HENT: Negative for congestion.   Eyes: Negative for discharge and redness.  Respiratory: Positive for shortness of breath. Negative for cough, hemoptysis, sputum production and wheezing.        SOB improved   Cardiovascular: Negative for chest pain, palpitations, orthopnea, claudication, leg swelling and PND.  Gastrointestinal: Negative for nausea, vomiting and abdominal pain.  Musculoskeletal: Negative for falls.  Skin: Negative for rash.  Neurological: Positive for weakness. Negative for sensory change, speech change, focal weakness and loss of consciousness.  Endo/Heme/Allergies: Does not bruise/bleed easily.  Psychiatric/Behavioral: The patient is not nervous/anxious.     Objective: Telemetry: Afib, 80's bpm Physical Exam: Blood pressure 117/63, pulse 95, temperature 97.8 F (36.6 C), temperature source Oral, resp. rate 16, height '5\' 4"'$  (1.626 m), weight 183 lb 14.4 oz (83.416 kg), SpO2 98 %. Body mass index is 31.55 kg/(m^2). General: Well developed, well nourished, in no acute distress. Head: Normocephalic, atraumatic, sclera non-icteric, no xanthomas, nares are without discharge. Neck: Negative for carotid bruits. JVP not elevated. Lungs: Improved rales along right base. Breathing is unlabored. Heart: irregularly-irregular, S1 S2 without murmurs, rubs, or gallops.  Abdomen: Soft, non-tender, non-distended with normoactive bowel sounds. No  rebound/guarding. Extremities: No clubbing or cyanosis. No edema. Distal pedal pulses are 2+ and equal bilaterally. Neuro: Alert and oriented X 3. Moves all extremities spontaneously. Psych:  Responds to questions appropriately with a normal affect.   Intake/Output Summary (Last 24 hours) at 01/26/16 0741 Last data filed at 01/26/16 0700  Gross per 24 hour  Intake 1118.4 ml  Output   1525 ml  Net -406.6 ml    Inpatient Medications:  . antiseptic oral rinse  7 mL Mouth Rinse q12n4p  . apixaban  2.5 mg Oral BID  . atorvastatin  20 mg Oral QPM  . budesonide (PULMICORT) nebulizer solution  0.25 mg Nebulization BID  . chlorhexidine  15 mL Mouth Rinse BID  . cholecalciferol  800 Units Oral QPM  . diltiazem  240 mg Oral QPM  . febuxostat  40 mg Oral QPM  . ferrous sulfate  325 mg Oral QPM  . furosemide  20 mg Oral Daily  . insulin aspart  0-15 Units Subcutaneous TID WC  . insulin aspart  0-5 Units Subcutaneous QHS  . metoprolol succinate  25 mg Oral BID  . multivitamin-lutein  1 capsule Oral QPM  . pantoprazole  40 mg Oral QPM  . predniSONE  30 mg Oral Q breakfast  . sertraline  50 mg Oral QPM  . sodium chloride flush  10 mL Intracatheter Q12H  . sodium chloride flush  3 mL Intravenous Q12H  . cyanocobalamin  500 mcg Oral QPM   Infusions:  . amiodarone 60 mg/hr (01/26/16 0626)    Labs:  Recent Labs  01/25/16 0540 01/26/16 0511  NA 136 138  138  K 3.9 3.9  3.9  CL 94* 95*  95*  CO2 32 35*  36*  GLUCOSE 158* 102*  103*  BUN 77* 70*  70*  CREATININE 2.00* 1.84*  1.82*  CALCIUM 8.5* 8.7*  8.7*  PHOS  --  3.5    Recent Labs  01/26/16 0511  ALBUMIN 3.6    Recent Labs  01/25/16 0540 01/26/16 0511  WBC 8.7 7.4  HGB 9.1* 8.9*  HCT 27.4* 26.9*  MCV 76.8* 77.2*  PLT 212 189   No results for input(s): CKTOTAL, CKMB, TROPONINI in the last 72 hours. Invalid input(s): POCBNP No results for input(s): HGBA1C in the last 72 hours.   Weights: Filed Weights    01/19/16 1507 01/19/16 2111  Weight: 183 lb (83.008 kg) 183 lb 14.4 oz (83.416 kg)     Radiology/Studies:  Dg Chest 1 View  01/24/2016  IMPRESSION: 1. Right internal jugular central venous catheter terminates at the cavoatrial junction. No pneumothorax . 2. Stable borderline mild congestive heart failure. 3. Stable small bilateral pleural effusions, left greater than right. 4. Stable bibasilar lung opacities, favor atelectasis. 5. Stable focal irregular opacity in the apical right upper lobe, nonspecific, differential includes posttreatment change or recurrent tumor, please see recent chest CT report for further details. Electronically Signed   By: Ilona Sorrel M.D.   On: 01/24/2016 12:39   Dg Chest 2 View  01/19/2016  IMPRESSION: No active disease. Hyperinflation again noted. Stable scarring and surgical clips in right upper lobe medially. Osteopenia and mild degenerative changes thoracic spine. Electronically Signed   By: Lahoma Crocker M.D.   On: 01/19/2016 16:28   Ct Chest Wo Contrast  01/22/2016  IMPRESSION: 1. Moderate size bilateral pleural effusions with compressive atelectasis, smooth septal thickening consistent pulmonary edema, and cardiomegaly. Suspect fluid overload/CHF. 2. Small focal left upper lobe opacity with both confluent ground-glass opacities and internal air bronchogram, may be infectious, inflammatory, or less likely neoplastic. 3. Right apical scarring, patient with history of lung cancer post radiation. The soft tissue density adjacent to the fiducial markers is increased from prior chest CT of October 2016 and PET-CT of 2015. Additionally there is increased curvilinear density more superiorly extending towards the apex. Differential considerations include delayed postradiation change versus recurrent disease. Superimposed infection is considered but felt less likely. PET-CT may be helpful for evaluation of metabolic activity. 4. Follow-up of the left lung abnormality  recommended with chest CT in 3 months, if PET-CT is not performed in the interim. Electronically Signed   By: Jeb Levering M.D.   On: 01/22/2016 00:48   US Renal  01/20/2016   IMPRESSION: Mild increased renal echogenicity and renal cortical atrophy likely representing medical renal disease. No evidence of hydronephrosis or solid mass. Multiple renal cysts again noted. Electronically Signed   By: Margarette Canada M.D.   On: 01/20/2016 17:10   Dg Chest Port 1 View  01/23/2016  IMPRESSION: Stable cardiomegaly. Layering small bilateral pleural effusions and underlying atelectasis. Persistent right upper lobe consolidation. Electronically Signed   By: Lovey Newcomer M.D.   On: 01/23/2016 09:20   Dg Chest Port 1 View  01/20/2016  IMPRESSION: Stable cardiomegaly, vascular congestion and possible mild edema. No focal airspace disease. Electronically Signed   By: Richardean Sale M.D.   On: 01/20/2016 18:29     Assessment and Plan  Principal Problem:   Acute on chronic renal failure (HCC) Active Problems:   Hyperkalemia   Chronic obstructive pulmonary disease with acute exacerbation (HCC)   SOB (shortness of breath)   Paroxysmal atrial fibrillation with rapid ventricular response (HCC)   Iron deficiency anemia due  to chronic blood loss   Essential hypertension   Acute on chronic diastolic heart failure ([place])    1.PAF: -Remains in rate controlled Afib this morning with heart rates in the 80's -Converted to NSR on 5/28, then back into Afib and has remained there since -Back on full-dose amiodarone infusion since 5/29 -Decrease amiodarone to 1/2 dose, followed by transition to PO -Get patient up and ambulate this morning to assess her heart rate and breathing -If these remain well controlled could keep rate controlled and plan for possible DCCV as an outpatient after she has been on Eliquis 2.5 mg bid for 3 weeks without missing any doses. If her heart rate/breathing worsen with ambulation she would  likely need DCV prior to discharge after she has received 5 doses (earliest would be the PM of 6/1) -Previously not on anticoagulation this admission given right femoral line -Started on Eliquis 2.5 mg bid on 5/30 (age of 61 and SCr of 1.84) -CBC stable -Not on digoxin for rate control given prior history of digoxin toxicity  -On Cardizem CD 240 mg daily and Toprol XL 25 mg daily for rate control  -CHADS2VASc at least 7 (CHF, HTN, age x 2, DM, vascular disease and sex category)  2. Acute on CKD stage III: -Stable -SCr improving with the holding of Lasix -Much of her SOB is 2/2 pulmonary including URI and COPD exacerbation, as well as her anemia  -Consider Epogen for her anemia   3. Acute respiratory failure: -As above -Steroids and ABX per IM -Consider nebs, defer to IM. Would use Xopenex over albuterol given the patient's Afib -Less likely acute diastolic CHF  4. Anemia of chronic disease: -Consider Epogen -Maintain hgb > 8.5 -Monitor closely, when/if full-dose anticoagulation  -Per IM   Signed, Christell Faith, PA-C Pager: [phone] 01/26/2016, 7:41 AM

## 2016-01-27 ENCOUNTER — Telehealth: Payer: Self-pay | Admitting: Cardiovascular Disease

## 2016-01-27 ENCOUNTER — Encounter: Payer: Self-pay | Admitting: Physician Assistant

## 2016-01-27 LAB — BASIC METABOLIC PANEL
Anion gap: 8 (ref 5–15)
BUN: 78 mg/dL — AB (ref 6–20)
CALCIUM: 8.6 mg/dL — AB (ref 8.9–10.3)
CO2: 34 mmol/L — ABNORMAL HIGH (ref 22–32)
CREATININE: 1.89 mg/dL — AB (ref 0.44–1.00)
Chloride: 96 mmol/L — ABNORMAL LOW (ref 101–111)
GFR calc Af Amer: 28 mL/min — ABNORMAL LOW (ref >60)
GFR, EST NON AFRICAN AMERICAN: 24 mL/min — AB (ref >60)
GLUCOSE: 109 mg/dL — AB (ref 65–99)
Potassium: 4 mmol/L (ref 3.5–5.1)
SODIUM: 138 mmol/L (ref 135–145)

## 2016-01-27 LAB — GLUCOSE, CAPILLARY
Glucose-Capillary: 97 mg/dL (ref 65–99)
Glucose-Capillary: 193 mg/dL — ABNORMAL HIGH (ref 65–99)

## 2016-01-27 LAB — HEMOGLOBIN: HEMOGLOBIN: 8.8 g/dL — AB (ref 12.0–16.0)

## 2016-01-27 MED ORDER — AMIODARONE HCL 200 MG PO TABS: 400.0000 mg | ORAL_TABLET | Freq: Two times a day (BID) | ORAL | Status: DC

## 2016-01-27 MED ORDER — DILTIAZEM HCL ER COATED BEADS 180 MG PO CP24: 180.0000 mg | ORAL_CAPSULE | Freq: Every evening | ORAL | Status: DC

## 2016-01-27 MED ORDER — AMIODARONE HCL 200 MG PO TABS
400.0000 mg | ORAL_TABLET | Freq: Two times a day (BID) | ORAL | Status: DC
  Administered 2016-01-27: 400 mg via ORAL
  Filled 2016-01-27: qty 2

## 2016-01-27 MED ORDER — AMIODARONE HCL 400 MG PO TABS: 400.0000 mg | ORAL_TABLET | Freq: Two times a day (BID) | ORAL | Status: DC

## 2016-01-27 MED ORDER — APIXABAN 2.5 MG PO TABS: 2.5000 mg | ORAL_TABLET | Freq: Two times a day (BID) | ORAL | Status: DC

## 2016-01-27 MED ORDER — PREDNISONE 10 MG PO TABS: 10.0000 mg | ORAL_TABLET | Freq: Every day | ORAL | Status: DC

## 2016-01-27 NOTE — Progress Notes (Signed)
Patient: Regina Burke / Admit Date: 01/19/2016 / Date of Encounter: 01/27/2016, 9:35 AM   Subjective: No acute overnight events. Converted to normal sinus rhythm with heart rate in the 50's to 60's bpm at 14:58 on 01/16/2016 and has reamined in NSR since. Breathing improved. Notes some dizziness now that she is back in sinus rhythm. On Toprol XL 25 mg bid and Cardizem CD 240 mg daily. Planning for discharge today.   Review of Systems: Review of Systems  Constitutional: Positive for weight loss and malaise/fatigue. Negative for fever, chills and diaphoresis.  HENT: Negative for congestion.   Eyes: Negative for discharge and redness.  Respiratory: Negative for cough, hemoptysis, sputum production, shortness of breath and wheezing.   Cardiovascular: Negative for chest pain, palpitations, orthopnea, claudication, leg swelling and PND.  Gastrointestinal: Negative for nausea, vomiting and abdominal pain.  Musculoskeletal: Negative for myalgias and falls.  Skin: Negative for rash.  Neurological: Positive for dizziness and weakness. Negative for tingling, tremors, sensory change, speech change, focal weakness and loss of consciousness.  Endo/Heme/Allergies: Does not bruise/bleed easily.  Psychiatric/Behavioral: The patient is not nervous/anxious.   All other systems reviewed and are negative.   Objective: Telemetry: converted to sinus rhythm at 14:48 on 5/31 and has remained in sinus rhythm with heart rate in the 50's 60's since Physical Exam: Blood pressure 119/40, pulse 57, temperature 97.4 F (36.3 C), temperature source Oral, resp. rate 18, height '5\' 4"'$  (1.626 m), weight 183 lb 14.4 oz (83.416 kg), SpO2 99 %. Body mass index is 31.55 kg/(m^2). General: Well developed, well nourished, in no acute distress. Head: Normocephalic, atraumatic, sclera non-icteric, no xanthomas, nares are without discharge. Neck: Negative for carotid bruits. JVP not elevated. Lungs: Clear bilaterally to  auscultation without wheezes, rales, or rhonchi. Breathing is unlabored. Heart: RRR S1 S2 without murmurs, rubs, or gallops.  Abdomen: Soft, non-tender, non-distended with normoactive bowel sounds. No rebound/guarding. Extremities: No clubbing or cyanosis. No edema. Distal pedal pulses are 2+ and equal bilaterally. Neuro: Alert and oriented X 3. Moves all extremities spontaneously. Psych:  Responds to questions appropriately with a normal affect.   Intake/Output Summary (Last 24 hours) at 01/27/16 0935 Last data filed at 01/27/16 0751  Gross per 24 hour  Intake 670.68 ml  Output    600 ml  Net  70.68 ml    Inpatient Medications:  . antiseptic oral rinse  7 mL Mouth Rinse q12n4p  . apixaban  2.5 mg Oral BID  . atorvastatin  20 mg Oral QPM  . budesonide (PULMICORT) nebulizer solution  0.25 mg Nebulization BID  . chlorhexidine  15 mL Mouth Rinse BID  . cholecalciferol  800 Units Oral QPM  . diltiazem  240 mg Oral QPM  . febuxostat  40 mg Oral QPM  . ferrous sulfate  325 mg Oral QPM  . furosemide  20 mg Oral Daily  . insulin aspart  0-15 Units Subcutaneous TID WC  . insulin aspart  0-5 Units Subcutaneous QHS  . metoprolol succinate  25 mg Oral BID  . multivitamin-lutein  1 capsule Oral QPM  . pantoprazole  40 mg Oral QPM  . predniSONE  30 mg Oral Q breakfast  . sertraline  50 mg Oral QPM  . sodium chloride flush  10 mL Intracatheter Q12H  . sodium chloride flush  3 mL Intravenous Q12H  . cyanocobalamin  500 mcg Oral QPM   Infusions:  . amiodarone 30 mg/hr (01/27/16 0404)    Labs:  Recent  Labs  01/26/16 0511 01/27/16 0430  NA 138  138 138  K 3.9  3.9 4.0  CL 95*  95* 96*  CO2 35*  36* 34*  GLUCOSE 102*  103* 109*  BUN 70*  70* 78*  CREATININE 1.84*  1.82* 1.89*  CALCIUM 8.7*  8.7* 8.6*  PHOS 3.5  --     Recent Labs  01/26/16 0511  ALBUMIN 3.6    Recent Labs  01/25/16 0540 01/26/16 0511 01/27/16 0430  WBC 8.7 7.4  --   HGB 9.1* 8.9* 8.8*  HCT  27.4* 26.9*  --   MCV 76.8* 77.2*  --   PLT 212 189  --    No results for input(s): CKTOTAL, CKMB, TROPONINI in the last 72 hours. Invalid input(s): POCBNP No results for input(s): HGBA1C in the last 72 hours.   Weights: Filed Weights   01/19/16 1507 01/19/16 2111  Weight: 183 lb (83.008 kg) 183 lb 14.4 oz (83.416 kg)     Radiology/Studies:  Dg Chest 1 View  01/24/2016  IMPRESSION: 1. Right internal jugular central venous catheter terminates at the cavoatrial junction. No pneumothorax . 2. Stable borderline mild congestive heart failure. 3. Stable small bilateral pleural effusions, left greater than right. 4. Stable bibasilar lung opacities, favor atelectasis. 5. Stable focal irregular opacity in the apical right upper lobe, nonspecific, differential includes posttreatment change or recurrent tumor, please see recent chest CT report for further details. Electronically Signed   By: Ilona Sorrel M.D.   On: 01/24/2016 12:39   Dg Chest 2 View  01/19/2016  IMPRESSION: No active disease. Hyperinflation again noted. Stable scarring and surgical clips in right upper lobe medially. Osteopenia and mild degenerative changes thoracic spine. Electronically Signed   By: Lahoma Crocker M.D.   On: 01/19/2016 16:28   Ct Chest Wo Contrast  01/22/2016  IMPRESSION: 1. Moderate size bilateral pleural effusions with compressive atelectasis, smooth septal thickening consistent pulmonary edema, and cardiomegaly. Suspect fluid overload/CHF. 2. Small focal left upper lobe opacity with both confluent ground-glass opacities and internal air bronchogram, may be infectious, inflammatory, or less likely neoplastic. 3. Right apical scarring, patient with history of lung cancer post radiation. The soft tissue density adjacent to the fiducial markers is increased from prior chest CT of October 2016 and PET-CT of 2015. Additionally there is increased curvilinear density more superiorly extending towards the apex. Differential  considerations include delayed postradiation change versus recurrent disease. Superimposed infection is considered but felt less likely. PET-CT may be helpful for evaluation of metabolic activity. 4. Follow-up of the left lung abnormality recommended with chest CT in 3 months, if PET-CT is not performed in the interim. Electronically Signed   By: Jeb Levering M.D.   On: 01/22/2016 00:48   US Renal  01/20/2016  IMPRESSION: Mild increased renal echogenicity and renal cortical atrophy likely representing medical renal disease. No evidence of hydronephrosis or solid mass. Multiple renal cysts again noted. Electronically Signed   By: Margarette Canada M.D.   On: 01/20/2016 17:10   Dg Chest Port 1 View  01/23/2016  IMPRESSION: Stable cardiomegaly. Layering small bilateral pleural effusions and underlying atelectasis. Persistent right upper lobe consolidation. Electronically Signed   By: Lovey Newcomer M.D.   On: 01/23/2016 09:20   Dg Chest Port 1 View  01/20/2016  IMPRESSION: Stable cardiomegaly, vascular congestion and possible mild edema. No focal airspace disease. Electronically Signed   By: Richardean Sale M.D.   On: 01/20/2016 18:29  Assessment and Plan  Principal Problem:   Acute on chronic renal failure (HCC) Active Problems:   Hyperkalemia   Chronic obstructive pulmonary disease with acute exacerbation (HCC)   SOB (shortness of breath)   Paroxysmal atrial fibrillation with rapid ventricular response (HCC)   Iron deficiency anemia due to chronic blood loss   Essential hypertension   Acute on chronic diastolic heart failure ([place])    1.PAF: -Converted to sinus rhythm at 14:58 on 01/26/2016 and has remained in sinus rhythm with heart rates in the 50's to 60's bpm since -Transition from IV amiodarone infusion to PO amiodarone 400 mg bid x 5 days, then 200 mg bid x 5 days, then 200 mg daily thereafter -Continue Eliquis 2.5 mg bid on 5/30 (age of 22 and SCr of 1.84) -CBC stable -Not on digoxin  for rate control given prior history of digoxin toxicity  -Notes some dizziness now that she is back in NSR. BP has been in the 1-teens. Will decrease Cardizem to 180 mg daily. Continue Toprol XL 25 mg bid  -CHADS2VASc at least 7 (CHF, HTN, age x 2, DM, vascular disease and sex category)  2. Acute on CKD stage III: -Stable -Much of her SOB is 2/2 pulmonary including URI and COPD exacerbation, as well as her anemia  -Consider Epogen for her anemia   3. Acute respiratory failure: -As above -Steroids and ABX per IM -Consider nebs, defer to IM. Would use Xopenex over albuterol given the patient's Afib -Less likely acute diastolic CHF  4. Anemia of chronic disease: -Stable -Maintain hgb > 8.5 -Monitor closely, when/if full-dose anticoagulation  -Per IM   Signed, Christell Faith, PA-C Pager: [phone] 01/27/2016, 9:35 AM

## 2016-01-27 NOTE — Telephone Encounter (Signed)
Attempted again to contact pt.  No answer, vm box not set up, unable to leave message.

## 2016-01-27 NOTE — Telephone Encounter (Signed)
Attempted to contact pt.  No answer, vm box not set up, unable to leave message.

## 2016-01-27 NOTE — Discharge Summary (Addendum)
[facility] NAME: Regina Burke    MR#:  [id number]  DATE OF BIRTH:  [birth year]  DATE OF ADMISSION:  01/19/2016 ADMITTING PHYSICIAN: Vaughan Basta, MD  DATE OF DISCHARGE: 01/27/2016  PRIMARY CARE PHYSICIAN: Tracie Harrier, MD    ADMISSION DIAGNOSIS:  Hyperkalemia [E87.5] Acute on chronic renal insufficiency (HCC) [N28.9, N18.9] Chronic obstructive pulmonary disease with acute exacerbation (HCC) [J44.1]  DISCHARGE DIAGNOSIS:  Principal Problem:   Acute on chronic renal failure (HCC) Active Problems:   Hyperkalemia   Chronic obstructive pulmonary disease with acute exacerbation (HCC)   SOB (shortness of breath)   Paroxysmal atrial fibrillation with rapid ventricular response (HCC)   Iron deficiency anemia due to chronic blood loss   Essential hypertension   Acute on chronic diastolic heart failure ([place])   SECONDARY DIAGNOSIS:   Past Medical History  Diagnosis Date  . COPD (chronic obstructive pulmonary disease) ([place])   . Chronic diastolic CHF (congestive heart failure) ([place])     a. 10/2015 Echo: EF 55-65%, Gr1 DD, mild MR, mildly dil LA, nl RV fxn, nl PASP.  Marland Kitchen Hypertensive heart disease   . High cholesterol   . Sleep apnea   . Cushing's disease ([place])   . Type II diabetes mellitus ([place])   . Anemia   . GERD (gastroesophageal reflux disease)   . History of hiatal hernia   . Migraine   . Arthritis   . Gout   . Depression   . Asthma   . Dysrhythmia   . Coronary artery disease     a. 11/2014 NSTEMI/PCI: LM nl, LAD 8m D1 30, LCX mild dzs, OM1 20p, OM2 253mOM3 90p (2.25x8 Promus Premier DES), RCA nl.   . HOH (hard of hearing)   . Chronic kidney disease (CKD), stage III (moderate)   . Anginal pain ([facility]  . Cough     CHRONIC AT NIGHT  . Edema     FEET/LEGS  . Wheezing   . History of pneumonia   . Lung cancer ([facility] 2014    S/P radiation 2015  . Diverticulitis   . Multiple allergies   . [facility]    HOSPITAL COURSE:   [age]ear old female with a history of PAF, diabetes and diastolic heart failure who was admitted with hypoxia and acute kidney injury. For further details please refer the H&P.  1. Acute respiratory failure on chronic respiratory failure due to acute on chronic diastolic heart failure and COPD exacerbation: Patient is now on her baseline oxygen need and her symptoms have improved. Skin on admission shows scar tissue on the right upper lobe.  2. Acute on chronic diastolic heart failure: Patient has improved and will continue on Lasix.  3. COPD exacerbation: Patient will continue prednisone taper and her inhalers.  4. Atrial fibrillation with RVR: Patient was evaluated by cardiology. She was initially started on amiodarone drip. She has been tapered to oral amiodarone. She will continue heart rate control with metoprolol and diltiazem. She will have close follow-up with cardiology.  5. Acute kidney injury on chronic kidney disease stage III with hyperkalemia: Potassium has improved. Patient was evaluated by endocrinology and has been ruled out for hypoaldosteronism. Her hyperkalemia was due to renal injury. Patient's creatinine has improved. AKI was due to poor by mouth intake and diuretics.  6. Anemia of chronic disease: Hemoglobin remained relatively stable.  7. Hyperkalemia: This is due to renal injury and has improved.  8. Depression/anxiety: Continue Zoloft  DISCHARGE CONDITIONS AND DIET:    Stable for discharge on heart healthy diet  CONSULTS OBTAINED:  Treatment Team:  Anthonette Legato, MD Minna Merritts, MD  DRUG ALLERGIES:   Allergies  Allergen Reactions  . Ciprofloxacin Shortness Of Breath, Itching and Rash  . Doxycycline Shortness Of Breath, Itching and Rash  . Penicillins Shortness Of Breath, Itching, Rash and Other (See Comments)    Has patient had a PCN reaction causing immediate rash,  facial/tongue/throat swelling, SOB or lightheadedness with hypotension: Yes Has patient had a PCN reaction causing severe rash involving mucus membranes or skin necrosis: No Has patient had a PCN reaction that required hospitalization No Has patient had a PCN reaction occurring within the last 10 years: No If all of the above answers are "NO", then may proceed with Cephalosporin use.  . Sulfa Antibiotics Shortness Of Breath, Itching and Rash  . Morphine And Related Itching  . Cefuroxime Rash    blisters    DISCHARGE MEDICATIONS:   Current Discharge Medication List    START taking these medications   Details  apixaban (ELIQUIS) 2.5 MG TABS tablet Take 1 tablet (2.5 mg total) by mouth 2 (two) times daily. Qty: 60 tablet, Refills: 0    diltiazem (CARDIZEM CD) 180 MG 24 hr capsule Take 1 capsule (180 mg total) by mouth every evening. Qty: 30 capsule, Refills: 0    predniSONE (DELTASONE) 10 MG tablet Take 1 tablet (10 mg total) by mouth daily with breakfast. 30 mg 1 day 20 mg daily for 2 days 10 mg daily for 2 days  Then STOP Qty: 20 tablet, Refills: 0      CONTINUE these medications which have CHANGED   Details  amiodarone (PACERONE) 400 MG tablet Take 1 tablet (400 mg total) by mouth 2 (two) times daily. Take 400 mg twice a day for 5 days then 400 mg daily Qty: 45 tablet, Refills: 0      CONTINUE these medications which have NOT CHANGED   Details  ALPRAZolam (XANAX) 0.25 MG tablet Take 0.25 mg by mouth 2 (two) times daily as needed for anxiety.    atorvastatin (LIPITOR) 20 MG tablet Take 1 tablet (20 mg total) by mouth every evening. Qty: 90 tablet, Refills: 3    cholecalciferol (VITAMIN D) 400 UNITS TABS tablet Take 800 Units by mouth every evening.     cyanocobalamin 500 MCG tablet Take 500 mcg by mouth every evening.    febuxostat (ULORIC) 40 MG tablet Take 40 mg by mouth every evening.     fenofibrate (TRICOR) 48 MG tablet Take 48 mg by mouth every evening.      ferrous sulfate 325 (65 FE) MG EC tablet Take 325 mg by mouth every evening.     furosemide (LASIX) 20 MG tablet Take 1 tablet (20 mg total) by mouth daily. Qty: 30 tablet, Refills: 5    metoprolol succinate (TOPROL-XL) 25 MG 24 hr tablet Take 25 mg by mouth every evening. Take with or immediately following a meal.    Multiple Vitamins-Minerals (PRESERVISION AREDS 2) CAPS Take 1 capsule by mouth every evening.     nitroGLYCERIN (NITROSTAT) 0.4 MG SL tablet Place 1 tablet (0.4 mg total) under the tongue every 5 (five) minutes as needed for chest pain. Qty: 25 tablet, Refills: 3    pantoprazole (PROTONIX) 40 MG tablet Take 40 mg by mouth every evening.     sertraline (ZOLOFT) 50 MG tablet Take 50 mg by mouth  every evening.     tiotropium (SPIRIVA) 18 MCG inhalation capsule Place 18 mcg into inhaler and inhale daily.      STOP taking these medications     aspirin EC 81 MG tablet      azithromycin (ZITHROMAX) 250 MG tablet      chlorpheniramine-HYDROcodone (TUSSIONEX) 10-8 MG/5ML SUER      clopidogrel (PLAVIX) 75 MG tablet      diltiazem (DILACOR XR) 240 MG 24 hr capsule      hydrochlorothiazide (HYDRODIURIL) 12.5 MG tablet               Today   CHIEF COMPLAINT:   Patient doing well this morning. Patient denies shortness of breath. Patient will like to go home today. She converted to normal sinus rhythm yesterday.   VITAL SIGNS:  Blood pressure 123/43, pulse 58, temperature 98.1 F (36.7 C), temperature source Oral, resp. rate 17, height '5\' 4"'$  (1.626 m), weight 83.416 kg (183 lb 14.4 oz), SpO2 100 %.   REVIEW OF SYSTEMS:  Review of Systems  Constitutional: Negative for fever, chills and malaise/fatigue.  HENT: Negative for ear discharge, ear pain, hearing loss, nosebleeds and sore throat.   Eyes: Negative for blurred vision and pain.  Respiratory: Negative for cough, hemoptysis, shortness of breath and wheezing.   Cardiovascular: Negative for chest pain,  palpitations and leg swelling.  Gastrointestinal: Negative for nausea, vomiting, abdominal pain, diarrhea and blood in stool.  Genitourinary: Negative for dysuria.  Musculoskeletal: Negative for back pain.  Neurological: Negative for dizziness, tremors, speech change, focal weakness, seizures and headaches.  Endo/Heme/Allergies: Does not bruise/bleed easily.  Psychiatric/Behavioral: Negative for depression, suicidal ideas and hallucinations.     PHYSICAL EXAMINATION:  GENERAL:  82 y.o.-year-old patient lying in the bed with no acute distress.  NECK:  Supple, no jugular venous distention. No thyroid enlargement, no tenderness.  LUNGS: Decreased breath sounds throughout lung fields no wheezing, rales,rhonchi  No use of accessory muscles of respiration.  CARDIOVASCULAR: S1, S2 normal. No murmurs, rubs, or gallops.  ABDOMEN: Soft, non-tender, non-distended. Bowel sounds present. No organomegaly or mass.  EXTREMITIES: No pedal edema, cyanosis, or clubbing.  PSYCHIATRIC: The patient is alert and oriented x 3.  SKIN: No obvious rash, lesion, or ulcer.   DATA REVIEW:   CBC  Recent Labs Lab 01/26/16 0511 01/27/16 0430  WBC 7.4  --   HGB 8.9* 8.8*  HCT 26.9*  --   PLT 189  --     Chemistries   Recent Labs Lab 01/23/16 0509  01/27/16 0430  NA 138  < > 138  K 4.2  < > 4.0  CL 94*  < > 96*  CO2 34*  < > 34*  GLUCOSE 112*  < > 109*  BUN 82*  < > 78*  CREATININE 2.48*  < > 1.89*  CALCIUM 8.6*  < > 8.6*  MG 2.0  --   --   < > = values in this interval not displayed.  Cardiac Enzymes  Recent Labs Lab 01/21/16 1228 01/21/16 1905 01/22/16 0010  TROPONINI <0.03 <0.03 <0.03    Microbiology Results  '@MICRORSLT48'$ @  RADIOLOGY:  No results found.   D?W Christell Faith at discharge Management plans discussed with the patient and she is in agreement. Stable for discharge home with [facility]  Patient should follow up with CARDIOLOGY 1 week  CODE STATUS:     Code Status Orders         Start  Ordered   01/19/16 1957  Limited resuscitation (code)   Continuous    Question Answer Comment  In the event of cardiac or respiratory ARREST: Initiate Code Blue, Call Rapid Response Yes   In the event of cardiac or respiratory ARREST: Perform CPR No   In the event of cardiac or respiratory ARREST: Perform Intubation/Mechanical Ventilation Yes   In the event of cardiac or respiratory ARREST: Use NIPPV/BiPAp only if indicated Yes   In the event of cardiac or respiratory ARREST: Administer ACLS medications if indicated No   In the event of cardiac or respiratory ARREST: Perform Defibrillation or Cardioversion if indicated No      01/19/16 1956    Code Status History    Date Active Date Inactive Code Status Order ID Comments User Context   11/26/2015  4:02 AM 11/26/2015  8:59 PM Full Code [id number]  Silver Huguenin, RN Inpatient   06/14/2015  6:57 PM 06/15/2015  8:54 PM DNR [id number]  Colleen Can, MD Inpatient   06/07/2015 10:20 PM 06/14/2015  6:56 PM Full Code [id number]  Lytle Butte, MD ED   05/15/2015  3:27 PM 05/24/2015  5:04 PM Full Code [id number]  Gladstone Lighter, MD ED   05/08/2015 11:23 AM 05/11/2015  3:13 PM Full Code [id number]  Dustin Flock, MD ED   04/15/2015 12:01 AM 04/15/2015  7:03 PM Full Code [id number]  Lance Coon, MD Inpatient   12/23/2014  1:09 PM 12/24/2014  5:17 PM Full Code [id number]  Burnell Blanks, MD Inpatient   12/23/2014  4:22 AM 12/23/2014  1:09 PM Full Code [id number]  Alwyn Pea, MD ED    Advance Directive Documentation        Most Recent Value   Type of Advance Directive  Healthcare Power of Attorney, Living will   Pre-existing out of facility DNR order (yellow form or pink MOST form)     "MOST" Form in Place?        TOTAL TIME TAKING CARE OF THIS PATIENT: 35 minutes.    Note: This dictation was prepared with Dragon dictation along with smaller phrase technology. Any transcriptional errors that result from this process are  unintentional.  Calia Napp M.D on 01/27/2016 at 11:36 AM  Between 7am to 6pm - Pager - [phone] After 6pm go to [email or web address] - password [email or web address] [facility]  Office  [phone]  CC: Primary care physician; Tracie Harrier, MD

## 2016-01-27 NOTE — Discharge Instructions (Signed)

## 2016-01-27 NOTE — Progress Notes (Signed)
Patient  Remains alert and oriented, oob to chair, denies any pain. Patient discharged home as per order, discharge instruction provided, right IJ removed

## 2016-01-27 NOTE — Telephone Encounter (Signed)
TCM...seen Dr Rockey Situ in hospital being discharged today.   Pt is coming 02/16/16 to see Thurmond Butts  They are on wait list to see [place] sooner.

## 2016-01-27 NOTE — Progress Notes (Signed)
Bedside report received, patient remains alert and oriented, denies any pain amiodrone drip AT  16.7, HR 62, WILL CONTINUE TO MONITOR

## 2016-01-27 NOTE — Care Management (Addendum)
Patient to be discharged today.  Home health services have already been arranged through [facility].  Regina Burke with [facility] notified of discharge order.  RNCM signing off. RNCM signing off

## 2016-02-16 ENCOUNTER — Ambulatory Visit (INDEPENDENT_AMBULATORY_CARE_PROVIDER_SITE_OTHER): Payer: Medicare Other | Admitting: Physician Assistant

## 2016-02-16 ENCOUNTER — Encounter: Payer: Self-pay | Admitting: Physician Assistant

## 2016-02-16 VITALS — BP 135/75 | HR 65 | Ht 65.0 in | Wt 185.5 lb

## 2016-02-16 DIAGNOSIS — I2510 Atherosclerotic heart disease of native coronary artery without angina pectoris: Secondary | ICD-10-CM

## 2016-02-16 DIAGNOSIS — I5032 Chronic diastolic (congestive) heart failure: Secondary | ICD-10-CM

## 2016-02-16 DIAGNOSIS — I10 Essential (primary) hypertension: Secondary | ICD-10-CM

## 2016-02-16 DIAGNOSIS — I480 Paroxysmal atrial fibrillation: Secondary | ICD-10-CM

## 2016-02-16 DIAGNOSIS — J9621 Acute and chronic respiratory failure with hypoxia: Secondary | ICD-10-CM

## 2016-02-16 DIAGNOSIS — J449 Chronic obstructive pulmonary disease, unspecified: Secondary | ICD-10-CM

## 2016-02-16 DIAGNOSIS — E785 Hyperlipidemia, unspecified: Secondary | ICD-10-CM

## 2016-02-16 MED ORDER — AMIODARONE HCL 200 MG PO TABS: 200.0000 mg | ORAL_TABLET | Freq: Every day | ORAL | Status: DC

## 2016-02-16 MED ORDER — DILTIAZEM HCL ER COATED BEADS 240 MG PO CP24: 240.0000 mg | ORAL_CAPSULE | Freq: Every evening | ORAL | Status: DC

## 2016-02-16 MED ORDER — RIVAROXABAN 15 MG PO TABS: 15.0000 mg | ORAL_TABLET | Freq: Every day | ORAL | Status: DC

## 2016-02-16 NOTE — Patient Instructions (Addendum)
Medication Instructions:  Your physician has recommended you make the following change in your medication:  STOP taking metoprolol STOP taking eliquis when your xarelto arrives via mail order START taking xarelto '15mg'$  once daily once your stop eliquis TAKE amiodarone '200mg'$  once daily INCREASE cardizem to '240mg'$  once daily     Labwork: none  Testing/Procedures: none  Follow-Up: Your physician recommends that you schedule a follow-up appointment in: 3 months with Dr. Rockey Situ.    Any Other Special Instructions Will Be Listed Below (If Applicable).     If you need a refill on your cardiac medications before your next appointment, please call your pharmacy.

## 2016-02-16 NOTE — Progress Notes (Signed)
Cardiology Office Note Date:  02/16/2016  Patient ID:  Regina Burke, Regina Burke 10/27/1932, MRN [id number] PCP:  Tracie Harrier, MD  Cardiologist:  Dr. Rockey Situ, MD    Chief Complaint: Hospital follow up  History of Present Illness: [name] is a 82 y.o. female with history of CAD s/p PCI/DES to the [place] in 11/2014, PAF on Eliquis, chronic diastolic CHF, chronic respiratory failure on home oxygen 2/2 COPD, HTN, HLD, and DM who presents for hospital follow up from recent admission to [facility] from 5/24-6/1 for acute on chronic respiratory failure in the setting of COPD exacerbation and mild acute on chronic diastolic CHF.   Over the past year, she has had several admissions for respiratory failure with PNA and bilateral pleural effusions requiring thoracentesis in 03/2015, and dyspnea in 05/2015 in the setting of Afib with RVR, renal failure, and hyperkalemia. She was started on amiodarone at that time with conversion to sinus rhythm and has remained on 400 mg daily ever since prior to her hospital admission. She was previously not on long term, full-dose anticoagulation 2/2 h/o anemia. She was admitted on 5/24 with 1 week history of progressive dyspnea, congestion, and malaise. She had previously been treated by her PCP with doxycycline, which led to blistering of her tongue, causing her to be switched to azithromycin. She continued to feel poorly and was noted to have oxygen saturations in the 80's at her oncology visit. Labs showed AKI with SCr of 2.80 and hyperkalemia of 6.3. She was admitted in NSR, though converted to Afib with heart rates in the 1-teens to 130's bpm at approximately 3 AM on 5/25. She was asymptomatic. She developed increased WOB on 5/26 and was placed on BiPAP. CXR showed increased vascular congestion which improved with diuresis, ABX, steroids, and nebs. She converted to sinus rhythm on 5/31 with heart rates in the 50's to 60's bpm. She diuresed well. She was started on low-dose  Eliquis prior to discharge given her age of 15 years and renal function.  Today, she comes in feeling much better. She has only had one episode of palpitations, 6/20, after using her nebulizer. She is uncertain if she has albuterol or Xopenex. Palpitations resolved s/p metoprolol. She has not had any since. No chest pain. Breathing is at her baseline. Weight stable. Her insurance company will not authorize Eliquis.    Past Medical History  Diagnosis Date  . COPD (chronic obstructive pulmonary disease) ([place])   . Chronic diastolic CHF (congestive heart failure) ([place])     a. 10/2015 Echo: EF 55-65%, Gr1 DD, mild MR, mildly dil LA, nl RV fxn, nl PASP.  Marland Kitchen Hypertensive heart disease   . HLD (hyperlipidemia)   . Sleep apnea   . Cushing's disease ([place])   . Type II diabetes mellitus ([place])   . Anemia   . GERD (gastroesophageal reflux disease)   . History of hiatal hernia   . Migraine   . Arthritis   . Gout   . Depression   . Asthma   . Coronary artery disease     a. 11/2014 NSTEMI/PCI: LM nl, LAD 75m D1 30, LCX mild dzs, OM1 20p, OM2 234mOM3 90p (2.25x8 Promus Premier DES), RCA nl.   . HOH (hard of hearing)   . Chronic kidney disease (CKD), stage III (moderate)   . Cough     CHRONIC AT NIGHT  . Edema     FEET/LEGS  . History of pneumonia   . Lung cancer (HCTysonsdx'd  2014    S/P radiation 2015  . Diverticulitis   . Multiple allergies   . PAF (paroxysmal atrial fibrillation) ([facility])     a. on amio, Toprol XL, Cardizem CD, and Eliquis 2.5 mg bid (age & SCr); b. CHADS2VASc ==> 7 (CHF, HTN, age x 2, DM, vascular disease and sex category)    Past Surgical History  Procedure Laterality Date  . Adrenalectomy Left 1980's    "[place]"  . Abdominal hysterectomy    . Appendectomy    . Cholecystectomy    . Coronary angioplasty with stent placement  12/23/2014  . Tonsillectomy    . Fracture surgery    . Wrist fracture surgery Bilateral ~ 2000  . Breast cyst excision Left   . Tubal ligation      . Left heart catheterization with coronary angiogram N/A 12/23/2014    Procedure: LEFT HEART CATHETERIZATION WITH CORONARY ANGIOGRAM;  Surgeon: Burnell Blanks, MD;  Location: [facility] CATH LAB;  Service: Cardiovascular;  Laterality: N/A;  . Percutaneous coronary stent intervention (pci-s)  12/23/2014    Procedure: PERCUTANEOUS CORONARY STENT INTERVENTION (PCI-S);  Surgeon: Burnell Blanks, MD;  Location: [facility] CATH LAB;  Service: Cardiovascular;;  Promus 2.25x8  . Cataract extraction w/phaco Right 12/28/2015    Procedure: CATARACT EXTRACTION PHACO AND INTRAOCULAR LENS PLACEMENT (IOC);  Surgeon: Birder Robson, MD;  Location: [facility] ORS;  Service: Ophthalmology;  Laterality: Right;  [place] 48.4 AP% 19.0 CDE 9.17 Fluid Pack Lot # [id number]    Current Outpatient Prescriptions  Medication Sig Dispense Refill  . ALPRAZolam (XANAX) 0.25 MG tablet Take 0.25 mg by mouth 2 (two) times daily as needed for anxiety.    Marland Kitchen atorvastatin (LIPITOR) 20 MG tablet Take 1 tablet (20 mg total) by mouth every evening. 90 tablet 3  . cholecalciferol (VITAMIN D) 400 UNITS TABS tablet Take 800 Units by mouth every evening.     . cyanocobalamin 500 MCG tablet Take 500 mcg by mouth every evening.    . diltiazem (CARDIZEM CD) 240 MG 24 hr capsule Take 1 capsule (240 mg total) by mouth every evening. 30 capsule 1  . febuxostat (ULORIC) 40 MG tablet Take 40 mg by mouth every evening.     . fenofibrate (TRICOR) 48 MG tablet Take 48 mg by mouth every evening.     . ferrous sulfate 325 (65 FE) MG EC tablet Take 325 mg by mouth every evening.     . furosemide (LASIX) 20 MG tablet Take 1 tablet (20 mg total) by mouth daily. 30 tablet 5  . nitroGLYCERIN (NITROSTAT) 0.4 MG SL tablet Place 1 tablet (0.4 mg total) under the tongue every 5 (five) minutes as needed for chest pain. 25 tablet 3  . pantoprazole (PROTONIX) 40 MG tablet Take 40 mg by mouth every evening.     . sertraline (ZOLOFT) 50 MG tablet Take 50 mg by mouth every  evening.     . tiotropium (SPIRIVA) 18 MCG inhalation capsule Place 18 mcg into inhaler and inhale daily.    Marland Kitchen amiodarone (PACERONE) 200 MG tablet Take 1 tablet (200 mg total) by mouth daily. 90 tablet 1  . Rivaroxaban (XARELTO) 15 MG TABS tablet Take 1 tablet (15 mg total) by mouth daily with supper. 90 tablet 1   No current facility-administered medications for this visit.    Allergies:   Ciprofloxacin; Doxycycline; Penicillins; Sulfa antibiotics; Morphine and related; Albuterol sulfate; and Cefuroxime   Social History:  The patient  reports that she quit smoking about  17 years ago. Her smoking use included Cigarettes. She has a 45 pack-year smoking history. She has never used smokeless tobacco. She reports that she does not drink alcohol or use illicit drugs.   Family History:  The patient's family history includes COPD in her brother; Diabetes in her mother; Heart disease in her father and mother; Hypertension in her father and mother; Osteoarthritis in her mother.  ROS:   Review of Systems  Constitutional: Positive for malaise/fatigue. Negative for fever, chills, weight loss and diaphoresis.  HENT: Negative for congestion.   Eyes: Negative for discharge and redness.  Respiratory: Positive for shortness of breath. Negative for cough, hemoptysis, sputum production and wheezing.   Cardiovascular: Positive for chest pain and palpitations. Negative for orthopnea, claudication, leg swelling and PND.       Palpitations s/p nebulizer  Gastrointestinal: Negative for heartburn, nausea and vomiting.  Musculoskeletal: Negative for falls.  Skin: Negative for rash.  Neurological: Negative for dizziness, tremors, sensory change, speech change, focal weakness, loss of consciousness and weakness.  Endo/Heme/Allergies: Does not bruise/bleed easily.  Psychiatric/Behavioral: The patient is not nervous/anxious.   All other systems reviewed and are negative.    PHYSICAL EXAM:  VS:  BP 135/75 mmHg   Pulse 65  Ht '5\' 5"'$  (1.651 m)  Wt 185 lb 8 oz (84.142 kg)  BMI 30.87 kg/m2 BMI: Body mass index is 30.87 kg/(m^2).   Physical Exam  Constitutional: She is oriented to person, place, and time. She appears well-developed and well-nourished. No distress.  HENT:  Head: Normocephalic and atraumatic.  Neck: Normal range of motion. No JVD present.  Cardiovascular: Normal rate and regular rhythm.   No extrasystoles are present. Exam reveals no gallop, no distant heart sounds, no friction rub, no midsystolic click and no opening snap.   Murmur heard.  Harsh midsystolic murmur is present with a grade of 2/6  at the upper right sternal border radiating to the neck Pulses:      Dorsalis pedis pulses are 2+ on the right side, and 2+ on the left side.  Pulmonary/Chest: Effort normal and breath sounds normal. No respiratory distress. She has no wheezes. She has no rales.  Abdominal: Soft.  Neurological: She is oriented to person, place, and time.  Skin: Skin is warm and dry. She is not diaphoretic.  Psychiatric: She has a normal mood and affect. Her behavior is normal. Judgment and thought content normal.    EKG:  Was ordered and interpreted by me today. Shows NSR, 65 bpm, left axis deviation, LBBB (old)  Recent Labs: 06/07/2015: TSH 1.316 01/19/2016: ALT 46 01/21/2016: B Natriuretic Peptide 943.0* 01/23/2016: Magnesium 2.0 01/26/2016: Platelets 189 01/27/2016: BUN 78*; Creatinine, Ser 1.89*; Hemoglobin 8.8*; Potassium 4.0; Sodium 138  No results found for requested labs within last 365 days.   Estimated Creatinine Clearance: 25.4 mL/min (by C-G formula based on Cr of 1.89).   Wt Readings from Last 3 Encounters:  02/16/16 185 lb 8 oz (84.142 kg)  01/19/16 183 lb 14.4 oz (83.416 kg)  01/19/16 183 lb 12.1 oz (83.35 kg)     Other studies reviewed: Additional studies/records reviewed today include: summarized above  ASSESSMENT AND PLAN:  1. PAF: She remains in sinus rhythm at this time with heart  rate of 65 bpm. She would like to go back to Cardizem CD 240 mg daily as she has "a lot of them" at home. Given her normal LVSF she will go back to Cardizem CD 240 mg daily. Stop metoprolol so  she does not become too bradycardic. Decrease amiodarone to 200 mg daily. Her insurance will not cover Eliquis. Change to Xarelto 15 mg q dinner (CrCL 23 mL/min). She has been given Eliquis samples to hold her over until her Xarelto comes in from [place]. Regarding her palpitations, she will check to see what nebulizer medication she has at home. If she does not have Xopenex we will send this in for her and she will stop albuterol. CHADS2ASc at least 7 (CHF, HTN, age x 2, DM, vascular disease, female).  2. Chronic diastolic CHF: She does not appear to be volume overloaded at this time. Metoprolol discontinued as above per patient request. CHF education.   3. CAD: No symptoms concerning for angina. Continue DOAC in place of aspirin. No plans for ischemic evaluation at this time.   4. Chronic respiratory failure with hypoxia 2/2 COPD: Stable. On home O2. Continue inhalers per PCP.   5. HTN: Well controlled. Continue current medications as above.   6. HLD: Lipitor  Disposition: F/u with Dr. Rockey Situ, MD in 3 months  Current medicines are reviewed at length with the patient today.  The patient did not have any concerns regarding medicines.  Melvern Banker PA-C 02/16/2016 3:42 PM     [address] [place], [place] [address] [phone]

## 2016-02-18 ENCOUNTER — Telehealth: Payer: Self-pay

## 2016-02-18 NOTE — Telephone Encounter (Signed)
Prior Authorization for xarelto has been approved from 01/18/2016 through 02/16/2017.

## 2016-02-25 ENCOUNTER — Telehealth: Payer: Self-pay | Admitting: Cardiovascular Disease

## 2016-02-25 NOTE — Telephone Encounter (Signed)
Jeani Hawking with [facility] # [phone] called stating that patient is in Afib off and on the past week. She is taking additional Metroprolol an extra 1/2 tab  And 1/2 tab amiodarone (PACERONE) 200 MG tablet and still going into afib.  Please call Jeani Hawking back.

## 2016-02-25 NOTE — Telephone Encounter (Signed)
Spoke w/ pt.  Advised her of Dr. Donivan Scull recommendation.  She is agreeable and will call back if her sx do not improve.   Asked her to call back next week and let me know how she's doing.

## 2016-02-25 NOTE — Telephone Encounter (Signed)
Spoke w/ Regina Burke.  She reports that she can feel when she goes in and out of afib, HR today 104, has been up to as high as 114. She is SOB intermittently, feels that HR is irregular today, but sx were worse yesterday.   She reports that she takes 3 lasix in the am, takes amiodarone 200 mg at night and has been taking an extra 1/2 metoprolol as directed. She would like to know what to do over the holiday weekend to alleviate her sx, as SOB is interfering w/ her daily activities.  Advised her that I will make Dr. Rockey Situ aware of her concerns and call back w/ his recommendation.

## 2016-02-25 NOTE — Telephone Encounter (Signed)
Would stop HCTZ Metoprolol twice a day Amiodarone 200 mg twice a day for now

## 2016-02-26 HISTORY — PX: INSERT / REPLACE / REMOVE PACEMAKER: SUR710

## 2016-03-01 ENCOUNTER — Telehealth: Payer: Self-pay | Admitting: Cardiovascular Disease

## 2016-03-01 NOTE — Telephone Encounter (Signed)
Would try to talk to patient as you are doing to get more details May need to come in for EKG to see what her rhythm is

## 2016-03-01 NOTE — Telephone Encounter (Signed)
Patient started on Sunday having cp with activity. Now  all day Monday and tuesday.  L side neck feeling weaker and more sob   Gollan changed meds last week .   Now regular HR 116.   Please call to discuss symptoms and need to see Dr. Rockey Situ .

## 2016-03-01 NOTE — Telephone Encounter (Signed)
Clarisse Gouge at 03/01/2016 3:15 PM             Patient started on Sunday having cp with activity. Now all day Monday and tuesday.  L side neck feeling weaker and more sob   Gollan changed meds last week . Now regular HR 116.   Please call to discuss symptoms and need to see Dr. Rockey Situ .

## 2016-03-01 NOTE — Telephone Encounter (Signed)
Please see previous phone note.  

## 2016-03-01 NOTE — Telephone Encounter (Signed)
Left message for pt to call back  °

## 2016-03-02 NOTE — Telephone Encounter (Signed)
Spoke w/ pt. She reports that she is not having chest pain today, she is just feeling weak.  Reports that she saw her PCP on Monday who did labs, but no EKG. She is concerned that she has had a previous heart attack and would like to make sure.  Pt is sched to see Dr. Rockey Situ tomorrow. Encouraged her to keep this appt.

## 2016-03-03 ENCOUNTER — Ambulatory Visit (INDEPENDENT_AMBULATORY_CARE_PROVIDER_SITE_OTHER): Payer: Medicare Other | Admitting: Cardiovascular Disease

## 2016-03-03 ENCOUNTER — Encounter: Payer: Self-pay | Admitting: Cardiovascular Disease

## 2016-03-03 VITALS — BP 111/68 | HR 118 | Ht 65.0 in | Wt 185.0 lb

## 2016-03-03 DIAGNOSIS — I5032 Chronic diastolic (congestive) heart failure: Secondary | ICD-10-CM | POA: Diagnosis not present

## 2016-03-03 DIAGNOSIS — E785 Hyperlipidemia, unspecified: Secondary | ICD-10-CM

## 2016-03-03 DIAGNOSIS — I2510 Atherosclerotic heart disease of native coronary artery without angina pectoris: Secondary | ICD-10-CM

## 2016-03-03 DIAGNOSIS — I480 Paroxysmal atrial fibrillation: Secondary | ICD-10-CM

## 2016-03-03 DIAGNOSIS — J441 Chronic obstructive pulmonary disease with (acute) exacerbation: Secondary | ICD-10-CM

## 2016-03-03 DIAGNOSIS — I10 Essential (primary) hypertension: Secondary | ICD-10-CM

## 2016-03-03 DIAGNOSIS — E1159 Type 2 diabetes mellitus with other circulatory complications: Secondary | ICD-10-CM

## 2016-03-03 DIAGNOSIS — R0602 Shortness of breath: Secondary | ICD-10-CM

## 2016-03-03 MED ORDER — AMIODARONE HCL 400 MG PO TABS: 400.0000 mg | ORAL_TABLET | Freq: Two times a day (BID) | ORAL | Status: DC

## 2016-03-03 NOTE — Patient Instructions (Addendum)
Medication Instructions:   Please increase the amiodarone up to 400 mg twice a day Stay on your other medications  Back down to one lasix when leg swelling resolves  Labwork:  No new labs  Testing/Procedures:  No testing  Follow-Up: It was a pleasure seeing you in the office today. Please call us if you have new issues that need to be addressed before your next appt.  [phone]  Your physician wants you to follow-up in: 2 weeks   If you need a refill on your cardiac medications before your next appointment, please call your pharmacy.

## 2016-03-03 NOTE — Progress Notes (Signed)
Patient ID: RIDHIMA GOLBERG, female   DOB: September 16, 1932, 82 y.o.   MRN: [id number] Cardiology Office Note  Date:  03/03/2016   ID:  CHELESA WEINGARTNER, DOB April 12, 1933, MRN [id number]  PCP:  Tracie Harrier, MD   Chief Complaint  Patient presents with  . other    C/o chest pain and nausea. Pt mentioned that she takes Metoprolol 25 mg bid but is not sure if succinate or tartrate? Meds reviewed verbally with pt.    HPI:  ANTANASIA KACZYNSKI is a 82 y.o. female hospitalization April 2016 For chest pain, stent placed to marginal branch of the circumflex, history of paroxysmal atrial fibrillation not on anticoagulation secondary to severe anemia, COPD, lung cancer, diabetes mellitus, admission to [facility] with acute respiratory distress, pneumonia, bilateral pleural effusions requiring thoracentesis, acute on chronic diastolic heart failure,  hospital admission in August 2016 for electrolyte abnormality, digoxin toxicity, readmitted 06/07/2015 with shortness of breath, found to have atrial fibrillation , potassium of 7, renal failure, anemia who presents for routine follow-up  for atrial fibrillation with RVR History of digoxin toxicity  In follow-up today, she reports developing shortness of breath starting this past Tuesday, 3 days ago. She has been compliant with her anticoagulation, takes xarelto 15 mg daily Heart rate has been elevated this week associated with her shortness of breath, feels that she went back into atrial fibrillation  She has been taking amiodarone 200 mg twice a day, Lasix 20 mg daily, metoprolol succinate 25 mg twice a day, diltiazem 240 mg daily  Despite medications above, heart rate has been elevated Presenting today in a wheelchair, legs are weak, not walking very much She has been taking Lasix recently with improvement of leg edema. She currently takes Lasix 20 mrem twice a day  EKG shows atrial fibrillation with rate 121 bpm, left bundle branch block  Other past  medical history Previously converted to normal sinus rhythm on amiodarone infusion She did have IV infiltration of the left forearm, had to go back to the emergency room and was treated with a special regiment as detailed in the notes.  Previously taking Xanax for stress at home  Hospital admission October 2016 at [facility]  hospitalization for respiratory distress, etiology Likely multifactorial including profound anemia, atrial fibrillation, severe COPD, pleural effusions . she has not qualified for BiPAP, "did the wrong sleep study (CPAP)." She denies any problems sleeping, doing well on nasal cannula oxygen Previously seen by Dr. Raul Del, has not had any follow-up  In the hospital had significant anemia, blood count down to hemoglobin 6.7  Was in atrial fibrillation but Converted to normal sinus rhythm after thoracentesis (first one) Breathing better after second thoracentesis   In terms of her anemia, she was given 1 unit of blood, hematocrit 21 up to 24 - 25 patient reports she used to have shots, possibly epo  Presented to [facility] on 12/22/2014 with chest pain. cardiac catheterization found to have mild 50% disease in the diagonal branch of the LAD, but high-grade 90% hazy stenosis in the marginal branch of the circumflex. She underwent successful PTCA and insertion of a 2.25x 8 millimeter Promus premier DES stent at the ostium of the OM3 vessell which was postdilated to 2.5 mm.  PMH:   has a past medical history of COPD (chronic obstructive pulmonary disease) ([place]); Chronic diastolic CHF (congestive heart failure) ([place]); Hypertensive heart disease; HLD (hyperlipidemia); Sleep apnea; Cushing's disease ([place]); Type II diabetes mellitus ([place]); Anemia; GERD (gastroesophageal reflux disease); History of hiatal  hernia; Migraine; Arthritis; Gout; Depression; Asthma; Coronary artery disease; HOH (hard of hearing); Chronic kidney disease (CKD), stage III (moderate); Cough; Edema; History of  pneumonia; Lung cancer ([place]) (dx'd 2014); Diverticulitis; Multiple allergies; and PAF (paroxysmal atrial fibrillation) ([place]).  PSH:    Past Surgical History  Procedure Laterality Date  . Adrenalectomy Left 1980's    "[place]"  . Abdominal hysterectomy    . Appendectomy    . Cholecystectomy    . Coronary angioplasty with stent placement  12/23/2014  . Tonsillectomy    . Fracture surgery    . Wrist fracture surgery Bilateral ~ 2000  . Breast cyst excision Left   . Tubal ligation    . Left heart catheterization with coronary angiogram N/A 12/23/2014    Procedure: LEFT HEART CATHETERIZATION WITH CORONARY ANGIOGRAM;  Surgeon: Burnell Blanks, MD;  Location: [facility] CATH LAB;  Service: Cardiovascular;  Laterality: N/A;  . Percutaneous coronary stent intervention (pci-s)  12/23/2014    Procedure: PERCUTANEOUS CORONARY STENT INTERVENTION (PCI-S);  Surgeon: Burnell Blanks, MD;  Location: [facility] CATH LAB;  Service: Cardiovascular;;  Promus 2.25x8  . Cataract extraction w/phaco Right 12/28/2015    Procedure: CATARACT EXTRACTION PHACO AND INTRAOCULAR LENS PLACEMENT (IOC);  Surgeon: Birder Robson, MD;  Location: [facility] ORS;  Service: Ophthalmology;  Laterality: Right;  [place] 48.4 AP% 19.0 CDE 9.17 Fluid Pack Lot # [id number]    Current Outpatient Prescriptions  Medication Sig Dispense Refill  . ALPRAZolam (XANAX) 0.25 MG tablet Take 0.25 mg by mouth 2 (two) times daily as needed for anxiety.    Marland Kitchen amiodarone (PACERONE) 400 MG tablet Take 1 tablet (400 mg total) by mouth 2 (two) times daily. 60 tablet 3  . atorvastatin (LIPITOR) 20 MG tablet Take 1 tablet (20 mg total) by mouth every evening. 90 tablet 3  . cholecalciferol (VITAMIN D) 400 UNITS TABS tablet Take 800 Units by mouth every evening.     . cyanocobalamin 500 MCG tablet Take 500 mcg by mouth every evening.    . diltiazem (CARDIZEM CD) 240 MG 24 hr capsule Take 1 capsule (240 mg total) by mouth every evening. 30 capsule 1  . febuxostat  (ULORIC) 40 MG tablet Take 40 mg by mouth every evening.     . fenofibrate (TRICOR) 48 MG tablet Take 48 mg by mouth every evening.     . ferrous sulfate 325 (65 FE) MG EC tablet Take 325 mg by mouth every evening.     . fluticasone furoate-vilanterol (BREO ELLIPTA) 100-25 MCG/INH AEPB Inhale 1 puff into the lungs daily.    . nitroGLYCERIN (NITROSTAT) 0.4 MG SL tablet Place 1 tablet (0.4 mg total) under the tongue every 5 (five) minutes as needed for chest pain. 25 tablet 3  . pantoprazole (PROTONIX) 40 MG tablet Take 40 mg by mouth every evening.     . Rivaroxaban (XARELTO) 15 MG TABS tablet Take 1 tablet (15 mg total) by mouth daily with supper. 90 tablet 1  . sertraline (ZOLOFT) 50 MG tablet Take 50 mg by mouth every evening.     . tiotropium (SPIRIVA) 18 MCG inhalation capsule Place 18 mcg into inhaler and inhale daily.    . furosemide (LASIX) 20 MG tablet Take 1 tablet (20 mg total) by mouth 2 (two) times daily as needed. 60 tablet 3  . metoprolol succinate (TOPROL XL) 25 MG 24 hr tablet Take 1 tablet (25 mg total) by mouth 2 (two) times daily. 60 tablet 6   No current facility-administered medications  for this visit.     Allergies:   Ciprofloxacin; Doxycycline; Penicillins; Sulfa antibiotics; Morphine and related; Albuterol sulfate; and Cefuroxime   Social History:  The patient  reports that she quit smoking about 17 years ago. Her smoking use included Cigarettes. She has a 45 pack-year smoking history. She has never used smokeless tobacco. She reports that she does not drink alcohol or use illicit drugs.   Family History:   family history includes COPD in her brother; Diabetes in her mother; Heart disease in her father and mother; Hypertension in her father and mother; Osteoarthritis in her mother.    Review of Systems: Review of Systems  Constitutional: Negative.   Respiratory: Negative.   Cardiovascular: Negative.   Gastrointestinal: Negative.   Musculoskeletal: Negative.    Neurological: Negative.   Psychiatric/Behavioral: Negative.   All other systems reviewed and are negative.    PHYSICAL EXAM: VS:  BP 111/68 mmHg  Pulse 118  Ht '5\' 5"'$  (1.651 m)  Wt 185 lb (83.915 kg)  BMI 30.79 kg/m2 , BMI Body mass index is 30.79 kg/(m^2). GEN: Well nourished, well developed, in no acute distress HEENT: normal Neck: no JVD, carotid bruits, or masses Cardiac: Irregular rate and rhythm, tachycardic no murmurs, rubs, or gallops,no edema  Respiratory:  clear to auscultation bilaterally, normal work of breathing GI: soft, nontender, nondistended, + BS MS: no deformity or atrophy Skin: warm and dry, no rash Neuro:  Strength and sensation are intact Psych: euthymic mood, full affect    Recent Labs: 06/07/2015: TSH 1.316 01/19/2016: ALT 46 01/21/2016: B Natriuretic Peptide 943.0* 01/23/2016: Magnesium 2.0 01/26/2016: Platelets 189 01/27/2016: BUN 78*; Creatinine, Ser 1.89*; Hemoglobin 8.8*; Potassium 4.0; Sodium 138    Lipid Panel Lab Results  Component Value Date   CHOL 202* 12/23/2014   HDL 28* 12/23/2014   LDLCALC 107* 12/23/2014   TRIG 337* 12/23/2014      Wt Readings from Last 3 Encounters:  03/03/16 185 lb (83.915 kg)  02/16/16 185 lb 8 oz (84.142 kg)  01/19/16 183 lb 14.4 oz (83.416 kg)       ASSESSMENT AND PLAN:  Paroxysmal atrial fibrillation ([place]) - Plan: EKG 12-Lead Recurrence of her atrial fibrillation with RVR likely in the past several days Unclear trigger what started her arrhythmia She has been compliant on her medications including amiodarone 200 mg twice a day Recommended for now she increase amiodarone back to 400 mg twice a day, stay on anticoagulation, Will attempt to try to restore normal sinus rhythm may need cardioversion in 2 weeks' time if still atrial fibrillation  Chronic diastolic CHF (congestive heart failure) (HCC) Leg edema has resolved on Lasix 20 minute grams twice a day  Suggested she decrease Lasix down to 20 mg  daily as leg edema has improved   HLD (hyperlipidemia) Encouraged her to stay on her Lipitor   Essential hypertension Blood pressure is well controlled on today's visit. No changes made to the medications.  Coronary artery disease involving native coronary artery of native heart without angina pectoris Currently with no symptoms of angina. No further workup at this time. Continue current medication regimen.  Chronic obstructive pulmonary disease with acute exacerbation (HCC) Currently on inhalers  Shortness of breath likely cardiac, not COPD in nature   SOB (shortness of breath) Likely secondary to atrial fibrillation with RVR  We work on rate control, restoring normal sinus rhythm with high-dose amiodarone   Type 2 diabetes mellitus with other circulatory complication (HCC) Recent glucose measurements ranging from  97 up to 190 Recommended low carbohydrate diet    Total encounter time more than 25 minutes  Greater than 50% was spent in counseling and coordination of care with the patient    Disposition:   F/U  2 weeks   Orders Placed This Encounter  Procedures  . EKG 12-Lead     Signed, Esmond Plants, M.D., Ph.D. 03/03/2016  [place], [place]

## 2016-03-04 ENCOUNTER — Inpatient Hospital Stay
Admission: EM | Admit: 2016-03-04 | Discharge: 2016-03-14 | DRG: 871 | Disposition: A | Discharge status: Home / Self Care | Payer: Medicare Other | Attending: Internal Medicine | Admitting: Internal Medicine

## 2016-03-04 ENCOUNTER — Other Ambulatory Visit: Payer: Self-pay

## 2016-03-04 ENCOUNTER — Emergency Department: Payer: Medicare Other

## 2016-03-04 ENCOUNTER — Encounter: Payer: Self-pay | Admitting: Emergency Medicine

## 2016-03-04 DIAGNOSIS — I5043 Acute on chronic combined systolic (congestive) and diastolic (congestive) heart failure: Secondary | ICD-10-CM | POA: Diagnosis present

## 2016-03-04 DIAGNOSIS — E1122 Type 2 diabetes mellitus with diabetic chronic kidney disease: Secondary | ICD-10-CM | POA: Diagnosis present

## 2016-03-04 DIAGNOSIS — Z7189 Other specified counseling: Secondary | ICD-10-CM | POA: Insufficient documentation

## 2016-03-04 DIAGNOSIS — M810 Age-related osteoporosis without current pathological fracture: Secondary | ICD-10-CM | POA: Diagnosis present

## 2016-03-04 DIAGNOSIS — Z87891 Personal history of nicotine dependence: Secondary | ICD-10-CM

## 2016-03-04 DIAGNOSIS — I447 Left bundle-branch block, unspecified: Secondary | ICD-10-CM | POA: Diagnosis present

## 2016-03-04 DIAGNOSIS — Z9981 Dependence on supplemental oxygen: Secondary | ICD-10-CM | POA: Diagnosis not present

## 2016-03-04 DIAGNOSIS — J9602 Acute respiratory failure with hypercapnia: Secondary | ICD-10-CM

## 2016-03-04 DIAGNOSIS — K219 Gastro-esophageal reflux disease without esophagitis: Secondary | ICD-10-CM | POA: Diagnosis present

## 2016-03-04 DIAGNOSIS — Z79899 Other long term (current) drug therapy: Secondary | ICD-10-CM | POA: Diagnosis not present

## 2016-03-04 DIAGNOSIS — J440 Chronic obstructive pulmonary disease with acute lower respiratory infection: Secondary | ICD-10-CM | POA: Diagnosis present

## 2016-03-04 DIAGNOSIS — Z881 Allergy status to other antibiotic agents status: Secondary | ICD-10-CM

## 2016-03-04 DIAGNOSIS — I480 Paroxysmal atrial fibrillation: Secondary | ICD-10-CM | POA: Diagnosis not present

## 2016-03-04 DIAGNOSIS — E785 Hyperlipidemia, unspecified: Secondary | ICD-10-CM | POA: Diagnosis present

## 2016-03-04 DIAGNOSIS — I2510 Atherosclerotic heart disease of native coronary artery without angina pectoris: Secondary | ICD-10-CM | POA: Diagnosis not present

## 2016-03-04 DIAGNOSIS — K760 Fatty (change of) liver, not elsewhere classified: Secondary | ICD-10-CM | POA: Diagnosis present

## 2016-03-04 DIAGNOSIS — I509 Heart failure, unspecified: Secondary | ICD-10-CM | POA: Diagnosis not present

## 2016-03-04 DIAGNOSIS — Z7951 Long term (current) use of inhaled steroids: Secondary | ICD-10-CM | POA: Diagnosis not present

## 2016-03-04 DIAGNOSIS — I252 Old myocardial infarction: Secondary | ICD-10-CM

## 2016-03-04 DIAGNOSIS — F411 Generalized anxiety disorder: Secondary | ICD-10-CM | POA: Diagnosis present

## 2016-03-04 DIAGNOSIS — I130 Hypertensive heart and chronic kidney disease with heart failure and stage 1 through stage 4 chronic kidney disease, or unspecified chronic kidney disease: Secondary | ICD-10-CM | POA: Diagnosis present

## 2016-03-04 DIAGNOSIS — J9601 Acute respiratory failure with hypoxia: Secondary | ICD-10-CM

## 2016-03-04 DIAGNOSIS — Z85118 Personal history of other malignant neoplasm of bronchus and lung: Secondary | ICD-10-CM

## 2016-03-04 DIAGNOSIS — F329 Major depressive disorder, single episode, unspecified: Secondary | ICD-10-CM | POA: Diagnosis present

## 2016-03-04 DIAGNOSIS — Z955 Presence of coronary angioplasty implant and graft: Secondary | ICD-10-CM | POA: Diagnosis not present

## 2016-03-04 DIAGNOSIS — Z923 Personal history of irradiation: Secondary | ICD-10-CM

## 2016-03-04 DIAGNOSIS — J9622 Acute and chronic respiratory failure with hypercapnia: Secondary | ICD-10-CM | POA: Diagnosis not present

## 2016-03-04 DIAGNOSIS — Z882 Allergy status to sulfonamides status: Secondary | ICD-10-CM

## 2016-03-04 DIAGNOSIS — Z515 Encounter for palliative care: Secondary | ICD-10-CM | POA: Diagnosis present

## 2016-03-04 DIAGNOSIS — G4733 Obstructive sleep apnea (adult) (pediatric): Secondary | ICD-10-CM

## 2016-03-04 DIAGNOSIS — Z885 Allergy status to narcotic agent status: Secondary | ICD-10-CM

## 2016-03-04 DIAGNOSIS — Z880 Allergy status to penicillin: Secondary | ICD-10-CM | POA: Diagnosis not present

## 2016-03-04 DIAGNOSIS — J449 Chronic obstructive pulmonary disease, unspecified: Secondary | ICD-10-CM

## 2016-03-04 DIAGNOSIS — H9190 Unspecified hearing loss, unspecified ear: Secondary | ICD-10-CM | POA: Diagnosis present

## 2016-03-04 DIAGNOSIS — E872 Acidosis: Secondary | ICD-10-CM | POA: Diagnosis present

## 2016-03-04 DIAGNOSIS — J9600 Acute respiratory failure, unspecified whether with hypoxia or hypercapnia: Secondary | ICD-10-CM

## 2016-03-04 DIAGNOSIS — Z6831 Body mass index (BMI) 31.0-31.9, adult: Secondary | ICD-10-CM

## 2016-03-04 DIAGNOSIS — I10 Essential (primary) hypertension: Secondary | ICD-10-CM | POA: Diagnosis present

## 2016-03-04 DIAGNOSIS — D631 Anemia in chronic kidney disease: Secondary | ICD-10-CM | POA: Diagnosis present

## 2016-03-04 DIAGNOSIS — A419 Sepsis, unspecified organism: Principal | ICD-10-CM | POA: Diagnosis present

## 2016-03-04 DIAGNOSIS — N189 Chronic kidney disease, unspecified: Secondary | ICD-10-CM

## 2016-03-04 DIAGNOSIS — J9621 Acute and chronic respiratory failure with hypoxia: Secondary | ICD-10-CM | POA: Diagnosis present

## 2016-03-04 DIAGNOSIS — J189 Pneumonia, unspecified organism: Secondary | ICD-10-CM | POA: Diagnosis present

## 2016-03-04 DIAGNOSIS — Z7901 Long term (current) use of anticoagulants: Secondary | ICD-10-CM

## 2016-03-04 DIAGNOSIS — Z8701 Personal history of pneumonia (recurrent): Secondary | ICD-10-CM

## 2016-03-04 DIAGNOSIS — I5033 Acute on chronic diastolic (congestive) heart failure: Secondary | ICD-10-CM | POA: Diagnosis present

## 2016-03-04 DIAGNOSIS — R6521 Severe sepsis with septic shock: Secondary | ICD-10-CM | POA: Diagnosis present

## 2016-03-04 DIAGNOSIS — I5023 Acute on chronic systolic (congestive) heart failure: Secondary | ICD-10-CM | POA: Diagnosis not present

## 2016-03-04 DIAGNOSIS — J441 Chronic obstructive pulmonary disease with (acute) exacerbation: Secondary | ICD-10-CM | POA: Diagnosis not present

## 2016-03-04 DIAGNOSIS — I4819 Other persistent atrial fibrillation: Secondary | ICD-10-CM

## 2016-03-04 DIAGNOSIS — N179 Acute kidney failure, unspecified: Secondary | ICD-10-CM | POA: Diagnosis not present

## 2016-03-04 DIAGNOSIS — I481 Persistent atrial fibrillation: Secondary | ICD-10-CM | POA: Diagnosis not present

## 2016-03-04 DIAGNOSIS — N183 Chronic kidney disease, stage 3 (moderate): Secondary | ICD-10-CM | POA: Diagnosis present

## 2016-03-04 DIAGNOSIS — I959 Hypotension, unspecified: Secondary | ICD-10-CM | POA: Diagnosis not present

## 2016-03-04 LAB — BASIC METABOLIC PANEL
Anion gap: 10 (ref 5–15)
BUN: 86 mg/dL — ABNORMAL HIGH (ref 6–20)
CHLORIDE: 96 mmol/L — AB (ref 101–111)
CO2: 30 mmol/L (ref 22–32)
CREATININE: 2.98 mg/dL — AB (ref 0.44–1.00)
Calcium: 8.8 mg/dL — ABNORMAL LOW (ref 8.9–10.3)
GFR calc non Af Amer: 14 mL/min — ABNORMAL LOW (ref >60)
GFR, EST AFRICAN AMERICAN: 16 mL/min — AB (ref >60)
Glucose, Bld: 273 mg/dL — ABNORMAL HIGH (ref 65–99)
POTASSIUM: 4.3 mmol/L (ref 3.5–5.1)
Sodium: 136 mmol/L (ref 135–145)

## 2016-03-04 LAB — BLOOD GAS, ARTERIAL
ACID-BASE EXCESS: 4 mmol/L — AB (ref 0.0–3.0)
BICARBONATE: 33.5 meq/L — AB (ref 21.0–28.0)
Delivery systems: POSITIVE
EXPIRATORY PAP: 8
FIO2: 0.45
Inspiratory PAP: 12
MECHANICAL RATE: 10
O2 SAT: 93.9 %
PATIENT TEMPERATURE: 37
PCO2 ART: 80 mmHg — AB (ref 32.0–48.0)
PH ART: 7.23 — AB (ref 7.350–7.450)
PO2 ART: 83 mmHg (ref 83.0–108.0)

## 2016-03-04 LAB — LACTIC ACID, PLASMA: Lactic Acid, Venous: 0.9 mmol/L (ref 0.5–1.9)

## 2016-03-04 LAB — CBC WITH DIFFERENTIAL/PLATELET
BASOS PCT: 1 %
Basophils Absolute: 0.1 10*3/uL (ref 0–0.1)
Eosinophils Absolute: 0.3 10*3/uL (ref 0–0.7)
Eosinophils Relative: 2 %
HEMATOCRIT: 33.6 % — AB (ref 35.0–47.0)
HEMOGLOBIN: 11.1 g/dL — AB (ref 12.0–16.0)
LYMPHS ABS: 2.1 10*3/uL (ref 1.0–3.6)
Lymphocytes Relative: 14 %
MCH: 26.7 pg (ref 26.0–34.0)
MCHC: 33.1 g/dL (ref 32.0–36.0)
MCV: 80.7 fL (ref 80.0–100.0)
MONOS PCT: 5 %
Monocytes Absolute: 0.8 10*3/uL (ref 0.2–0.9)
NEUTROS ABS: 12.1 10*3/uL — AB (ref 1.4–6.5)
NEUTROS PCT: 78 %
Platelets: 482 10*3/uL — ABNORMAL HIGH (ref 150–440)
RBC: 4.17 MIL/uL (ref 3.80–5.20)
RDW: 17.6 % — ABNORMAL HIGH (ref 11.5–14.5)
WBC: 15.4 10*3/uL — ABNORMAL HIGH (ref 3.6–11.0)

## 2016-03-04 LAB — TROPONIN I

## 2016-03-04 LAB — BRAIN NATRIURETIC PEPTIDE: B Natriuretic Peptide: 1114 pg/mL — ABNORMAL HIGH (ref 0.0–100.0)

## 2016-03-04 LAB — PROCALCITONIN

## 2016-03-04 MED ORDER — SODIUM CHLORIDE 0.9 % IV BOLUS (SEPSIS)
1000.0000 mL | Freq: Once | INTRAVENOUS | Status: AC
  Administered 2016-03-04: 1000 mL via INTRAVENOUS

## 2016-03-04 MED ORDER — METHYLPREDNISOLONE SODIUM SUCC 125 MG IJ SOLR
INTRAMUSCULAR | Status: AC
  Administered 2016-03-04: 125 mg via INTRAVENOUS
  Filled 2016-03-04: qty 2

## 2016-03-04 MED ORDER — ALBUTEROL SULFATE (2.5 MG/3ML) 0.083% IN NEBU
10.0000 mg | INHALATION_SOLUTION | Freq: Once | RESPIRATORY_TRACT | Status: AC
  Administered 2016-03-04: 10 mg via RESPIRATORY_TRACT

## 2016-03-04 MED ORDER — ALBUTEROL SULFATE (2.5 MG/3ML) 0.083% IN NEBU
INHALATION_SOLUTION | RESPIRATORY_TRACT | Status: AC
  Administered 2016-03-04: 10 mg via RESPIRATORY_TRACT
  Filled 2016-03-04: qty 6

## 2016-03-04 MED ORDER — BUDESONIDE 0.5 MG/2ML IN SUSP
0.5000 mg | Freq: Two times a day (BID) | RESPIRATORY_TRACT | Status: AC
  Administered 2016-03-05 – 2016-03-06 (×3): 0.5 mg via RESPIRATORY_TRACT
  Filled 2016-03-04 (×4): qty 2

## 2016-03-04 MED ORDER — SODIUM CHLORIDE 0.9 % IV SOLN
250.0000 mL | INTRAVENOUS | Status: DC | PRN
  Administered 2016-03-08: 08:00:00 via INTRAVENOUS

## 2016-03-04 MED ORDER — MAGNESIUM SULFATE 2 GM/50ML IV SOLN
INTRAVENOUS | Status: AC
  Administered 2016-03-04: 2 g via INTRAVENOUS
  Filled 2016-03-04: qty 50

## 2016-03-04 MED ORDER — SODIUM CHLORIDE 0.9 % IV SOLN
INTRAVENOUS | Status: DC
  Administered 2016-03-05: 01:00:00 via INTRAVENOUS

## 2016-03-04 MED ORDER — HEPARIN SODIUM (PORCINE) 5000 UNIT/ML IJ SOLN: 5000.0000 [IU] | Freq: Three times a day (TID) | INTRAMUSCULAR | Status: DC

## 2016-03-04 MED ORDER — DEXTROSE 5 % IV SOLN: 2.0000 g | Freq: Four times a day (QID) | INTRAVENOUS | Status: DC

## 2016-03-04 MED ORDER — METHYLPREDNISOLONE SODIUM SUCC 125 MG IJ SOLR
60.0000 mg | Freq: Four times a day (QID) | INTRAMUSCULAR | Status: DC
  Administered 2016-03-05 (×2): 60 mg via INTRAVENOUS
  Filled 2016-03-04 (×2): qty 2

## 2016-03-04 MED ORDER — AZTREONAM 2 G IJ SOLR
2.0000 g | Freq: Once | INTRAMUSCULAR | Status: AC
  Administered 2016-03-04: 2 g via INTRAVENOUS
  Filled 2016-03-04: qty 2

## 2016-03-04 MED ORDER — ALBUTEROL SULFATE (2.5 MG/3ML) 0.083% IN NEBU
INHALATION_SOLUTION | RESPIRATORY_TRACT | Status: AC
  Administered 2016-03-04: 22:00:00
  Filled 2016-03-04: qty 6

## 2016-03-04 MED ORDER — SODIUM CHLORIDE 0.9 % IV BOLUS (SEPSIS)
1000.0000 mL | Freq: Once | INTRAVENOUS | Status: AC
Start: 2016-03-04 — End: 2016-03-05
  Administered 2016-03-04: 1000 mL via INTRAVENOUS

## 2016-03-04 MED ORDER — VANCOMYCIN HCL IN DEXTROSE 1-5 GM/200ML-% IV SOLN
1000.0000 mg | Freq: Once | INTRAVENOUS | Status: AC
  Administered 2016-03-04: 1000 mg via INTRAVENOUS
  Filled 2016-03-04: qty 200

## 2016-03-04 NOTE — H&P (Signed)
PULMONARY / CRITICAL CARE MEDICINE   Name: Regina Burke MRN: [id number] DOB: 27-Aug-1933    ADMISSION DATE:  03/04/2016 CONSULTATION DATE:  03/04/16  REFERRING MD: EDP  CHIEF COMPLAINT:Acute shortness of breath  HISTORY OF PRESENT ILLNESS:   Regina Burke is a 82 year old female with past medical history significant for COPD on home oxygen, chronic diastolic congestive heart failure, hyperlipidemia, hypertension, sleep apnea, type 2 diabetes, GERD, depression, gout, chronic kidney disease stage III, lung cancer (diagnosed in 2014) and paroxysmal atrial fibrillation. Patient presented to ED via EMS due to respiratory distress which was ongoing for 3 days. She had worsening shortness of breath today. She bumped her oxygen up to 6 L but was satting only up to 84%. EMS placed her on CPAP and her oxygen sats where up to 96%. Patient was more somnolent and therefore EMS gave 2 DuoNeb's. Patient became more awake and was able to nod appropriately to yes or no questions. Patient was placed on BiPAP upon arrival to the [facility] ED. Her chest x-ray was concerning for bilateral lower lobe opacities with WBC of 15.4. Therefore sepsis protocol was initiated and PCCM team was  Called to admit the patient.  PAST MEDICAL HISTORY :  She  has a past medical history of COPD (chronic obstructive pulmonary disease) ([place]); Chronic diastolic CHF (congestive heart failure) ([place]); Hypertensive heart disease; HLD (hyperlipidemia); Sleep apnea; Cushing's disease ([place]); Type II diabetes mellitus ([place]); Anemia; GERD (gastroesophageal reflux disease); History of hiatal hernia; Migraine; Arthritis; Gout; Depression; Asthma; Coronary artery disease; HOH (hard of hearing); Chronic kidney disease (CKD), stage III (moderate); Cough; Edema; History of pneumonia; Lung cancer ([place]) (dx'd 2014); Diverticulitis; Multiple allergies; PAF (paroxysmal atrial fibrillation) ([place]); and Hypertension.  PAST SURGICAL  HISTORY: She  has past surgical history that includes Adrenalectomy (Left, 1980's); Abdominal hysterectomy; Appendectomy; Cholecystectomy; Coronary angioplasty with stent (12/23/2014); Tonsillectomy; Fracture surgery; Wrist fracture surgery (Bilateral, ~ 2000); Breast cyst excision (Left); Tubal ligation; left heart catheterization with coronary angiogram (N/A, 12/23/2014); percutaneous coronary stent intervention (pci-s) (12/23/2014); and Cataract extraction w/PHACO (Right, 12/28/2015).  Allergies  Allergen Reactions  . Ciprofloxacin Shortness Of Breath, Itching and Rash  . Doxycycline Shortness Of Breath, Itching and Rash  . Penicillins Shortness Of Breath, Itching, Rash and Other (See Comments)    Has patient had a PCN reaction causing immediate rash, facial/tongue/throat swelling, SOB or lightheadedness with hypotension: Yes Has patient had a PCN reaction causing severe rash involving mucus membranes or skin necrosis: No Has patient had a PCN reaction that required hospitalization No Has patient had a PCN reaction occurring within the last 10 years: No If all of the above answers are "NO", then may proceed with Cephalosporin use.  . Sulfa Antibiotics Shortness Of Breath, Itching and Rash  . Morphine And Related Itching  . Albuterol Sulfate   . Cefuroxime Rash    blisters    No current facility-administered medications on file prior to encounter.   Current Outpatient Prescriptions on File Prior to Encounter  Medication Sig  . ALPRAZolam (XANAX) 0.25 MG tablet Take 0.25 mg by mouth 2 (two) times daily as needed for anxiety.  Marland Kitchen amiodarone (PACERONE) 400 MG tablet Take 1 tablet (400 mg total) by mouth 2 (two) times daily.  Marland Kitchen atorvastatin (LIPITOR) 20 MG tablet Take 1 tablet (20 mg total) by mouth every evening.  . cholecalciferol (VITAMIN D) 400 UNITS TABS tablet Take 800 Units by mouth every evening.   . cyanocobalamin 500 MCG tablet  Take 500 mcg by mouth every evening.  . diltiazem  (CARDIZEM CD) 240 MG 24 hr capsule Take 1 capsule (240 mg total) by mouth every evening.  . febuxostat (ULORIC) 40 MG tablet Take 40 mg by mouth every evening.   . fenofibrate (TRICOR) 48 MG tablet Take 48 mg by mouth every evening.   . ferrous sulfate 325 (65 FE) MG EC tablet Take 325 mg by mouth every evening.   . fluticasone furoate-vilanterol (BREO ELLIPTA) 100-25 MCG/INH AEPB Inhale 1 puff into the lungs daily.  . furosemide (LASIX) 20 MG tablet Take 20 mg by mouth daily.   . metoprolol succinate (TOPROL XL) 25 MG 24 hr tablet Take 50 mg by mouth 2 (two) times daily.   . nitroGLYCERIN (NITROSTAT) 0.4 MG SL tablet Place 1 tablet (0.4 mg total) under the tongue every 5 (five) minutes as needed for chest pain.  . pantoprazole (PROTONIX) 40 MG tablet Take 40 mg by mouth every evening.   . Rivaroxaban (XARELTO) 15 MG TABS tablet Take 1 tablet (15 mg total) by mouth daily with supper.  . sertraline (ZOLOFT) 50 MG tablet Take 50 mg by mouth every evening.   . tiotropium (SPIRIVA) 18 MCG inhalation capsule Place 18 mcg into inhaler and inhale daily.    FAMILY HISTORY:  Her indicated that her mother is deceased. She indicated that her father is deceased.   SOCIAL HISTORY: She  reports that she quit smoking about 17 years ago. Her smoking use included Cigarettes. She has a 45 pack-year smoking history. She has never used smokeless tobacco. She reports that she does not drink alcohol or use illicit drugs.  REVIEW OF SYSTEMS:   Unable to obtain as the patient was on BiPAP  SUBJECTIVE:  Patient nods her head that she has been feeling better since she got here.  VITAL SIGNS: BP 121/95 mmHg  Pulse 118  Temp(Src) 98 F (36.7 C) (Axillary)  Resp 15  Ht '5\' 4"'$  (1.626 m)  Wt 82.101 kg (181 lb)  BMI 31.05 kg/m2  SpO2 98%  HEMODYNAMICS:    VENTILATOR SETTINGS:    INTAKE / OUTPUT:    PHYSICAL EXAMINATION: General:Elderly white female found on BiPAP  Neuro:  Awake, alert, oriented,  follows commands  HEENT: Atraumatic, normocephalic, no discharge noted  Cardiovascular:  Irregular, S1 and S2, no murmur rub or gallop noted  Lungs:  Tachypneic, coarse, faint wheezes throughout Abdomen:  Soft, nontender, active bowel sounds Musculoskeletal:  No inflammation/deformity noted Skin:  Grossly intact  LABS:  BMET  Recent Labs Lab 03/04/16 2100  NA 136  K 4.3  CL 96*  CO2 30  BUN 86*  CREATININE 2.98*  GLUCOSE 273*    Electrolytes  Recent Labs Lab 03/04/16 2100  CALCIUM 8.8*    CBC  Recent Labs Lab 03/04/16 2100  WBC 15.4*  HGB 11.1*  HCT 33.6*  PLT 482*    Coag's No results for input(s): APTT, INR in the last 168 hours.  Sepsis Markers  Recent Labs Lab 03/04/16 2140  LATICACIDVEN 0.9    ABG  Recent Labs Lab 03/04/16 2111  PHART 7.23*  PCO2ART 80*  PO2ART 83    Liver Enzymes No results for input(s): AST, ALT, ALKPHOS, BILITOT, ALBUMIN in the last 168 hours.  Cardiac Enzymes  Recent Labs Lab 03/04/16 2100  TROPONINI <0.03    Glucose No results for input(s): GLUCAP in the last 168 hours.  Imaging Dg Chest 1 View  03/04/2016  CLINICAL DATA:  Respiratory  distress EXAM: CHEST 1 VIEW COMPARISON:  Chest radiograph dated 01/24/2016. CT chest dated 01/21/2016. FINDINGS: Patchy bilateral lower lobe opacities, suspicious for pneumonia, less likely atelectasis. Superimposed mild interstitial edema is suspected. Small bilateral pleural effusions. No pneumothorax. Again noted is a stable focal opacity in the medial right upper lobe, most of which likely reflects radiation changes when correlating with prior CT, underlying residual/recurrent tumor not excluded. Cardiomegaly. IMPRESSION: Patchy bilateral lower lobe opacities, suspicious for pneumonia, less likely atelectasis. Superimposed mild interstitial edema is suspected. Small bilateral pleural effusions. Stable focal opacity in the medial right upper lobe, most of which likely reflects  radiation changes when correlating with prior CT, underlying residual/recurrent tumor not excluded. Electronically Signed   By: Julian Hy M.D.   On: 03/04/2016 21:22     STUDIES:  none  CULTURES: 7/8 blood cultures >> 7/18 urine culture >> 7/8 sputum culture >>  ANTIBIOTICS: 7/8 vancomycin>> 7/8 azactam>>  SIGNIFICANT EVENTS: 28/62 >>82 year old female with various comorbidities now presenting to the ED bilateral lower lobe pneumonia requiring BiPAP  LINES/TUBES: None  DISCUSSION: 82 year old female with multiple comorbidities such as COPD, CHF, chronic kidney disease, hypertension, sleep apnea now presenting with bilateral lower lobe pneumonia requiring BiPAP.  ASSESSMENT / PLAN:  PULMONARY A:  Septic shock secondary to pneumonia Bilateral lower lobe PNA Acute on chronic respiratory failure History of sleep apnea  Hx of right upper lobe cancer  P:   Continue BiPAP Routine ABG Vancomycin/Azactam Follow cultures Continue Bronchodilators Continue Solu-Medrol  CARDIOVASCULAR A:  Shock secondary to sepsis Hypotension History of hypertension Atrial fibrillation History of Hyperlipidemia History of chronic diastolic CHF  P:  Resuscitate with I/V Fluids Keep MAP goals>65 Hold diltiazem/metoprolol   hold home dose Lasix Trend BNP  RENAL A:   Acute on chronic kidney disease P:   Replace electrolytes per ICU protocol Follow chemistry   GASTROINTESTINAL A:   History of GERD P:  Nothing by mouth for now  Pepcid   HEMATOLOGIC A:   No active issues P:  Transfuse if Hgb< 7 lovenox for DVT prophylaxis  INFECTIOUS A:   Septic shock secondary to PNA Leukocytosis P:  I/v fluids Follow cultures MAP Goals>65 Pro-calcitonin<0.10, lactic acid-0.9  ENDOCRINE A:   Diabetes Melitus type-2 P:   Blood sugar checks q4 hours ssi coverage  NEUROLOGIC A:   Hx of depression P:   RASS goal:0 Will hold zoloft for now     Bincy  Varughese,AG-ACNP Pulmonary and [place]   03/04/2016, 11:14 PM  STAFF NOTE: I, Dr. Vilinda Boehringer have personally reviewed patient's available data, including medical history, events of note, physical examination and test results as part of my evaluation. I have discussed with resident/NP NP Demaris Callander and other care providers such as pharmacist, RN and RRT.  In addition,  I personally evaluated patient and elicited key findings of   HPI:  82 year old female past medical history second for COPD on oxygen, diastolic heart failure, hyperlipidemia, sleep apnea currently not using CPAP, type 2 diabetes, stage III kidney disease, history of lung cancer, atrial fibrillation on an tech regulation, recent admission for acute on chronic respiratory failure secondary to acute on chronic heart failure, now presenting again with respiratory distress requiring BIPAP. She recently saw her primary care physician on 02/28/2016 for increased shortness of breath, he added Breo, and due to insurance purposes change her from Eloquis to Xarelto. However, over the last 3 days she has been having increased shortness of breath,  today noted to have mild increase in her BNP. Admission ABG showed respiratory acidosis with out metabolic compensation. Patient was placed on BiPAP, repeat ABG showed improving respiratory acidosis with metabolic compensation, her chest x-ray showed that she bilateral lower lobe opacities, however I do not have access to compare her recent x-ray with her x-ray on 02/28/16 at primary care office. Patient was admitted to the ICU and started on treatment for CHF, acute and chronic respiratory failure, and suspected pneumonia. This morning patient was in acute respiratory distress on the BiPAP, she had increased wheezing with poor air movement, noted to be in rapid atrial fibrillation, she was given 20 mg of Cardizem, 1 of morphine, 2 Xopenex/Atrovent treatments  back-to-back, and her BiPAP was adjusted. After this patient respiratory status improved, wheezing persisted however air movement improved.  O:  GEN-ill appearing, moderate distress, still alert and following commands on bipap HEENT-PERRLA, [place]/AT, no lesions CVS-S1, S2, irregular LUNGS-coarse upper airway sounds, diffuse wheezing, dec basilar breath sounds, moderate air flow movement ABD-obese, NT, ND, +BS MSK-mild LLE edema   Recent Labs CBC Latest Ref Rng 03/04/2016 01/27/2016 01/26/2016  WBC 3.6 - 11.0 K/uL 15.4(H) - 7.4  Hemoglobin 12.0 - 16.0 g/dL 11.1(L) 8.8(L) 8.9(L)  Hematocrit 35.0 - 47.0 % 33.6(L) - 26.9(L)  Platelets 150 - 440 K/uL 482(H) - 189      Recent Labs BMP Latest Ref Rng 03/05/2016 03/04/2016 01/27/2016  Glucose 65 - 99 mg/dL 150(H) 273(H) 109(H)  BUN 6 - 20 mg/dL 86(H) 86(H) 78(H)  Creatinine 0.44 - 1.00 mg/dL 2.86(H) 2.98(H) 1.89(H)  Sodium 135 - 145 mmol/L 139 136 138  Potassium 3.5 - 5.1 mmol/L 4.1 4.3 4.0  Chloride 101 - 111 mmol/L 103 96(L) 96(L)  CO2 22 - 32 mmol/L 27 30 34(H)  Calcium 8.9 - 10.3 mg/dL 8.1(L) 8.8(L) 8.6(L)       (The following images and results were reviewed by Dr. Stevenson Clinch on 03/05/2016). Dg Chest 1 View  03/04/2016  CLINICAL DATA:  Respiratory distress EXAM: CHEST 1 VIEW COMPARISON:  Chest radiograph dated 01/24/2016. CT chest dated 01/21/2016. FINDINGS: Patchy bilateral lower lobe opacities, suspicious for pneumonia, less likely atelectasis. Superimposed mild interstitial edema is suspected. Small bilateral pleural effusions. No pneumothorax. Again noted is a stable focal opacity in the medial right upper lobe, most of which likely reflects radiation changes when correlating with prior CT, underlying residual/recurrent tumor not excluded. Cardiomegaly. IMPRESSION: Patchy bilateral lower lobe opacities, suspicious for pneumonia, less likely atelectasis. Superimposed mild interstitial edema is suspected. Small bilateral pleural effusions. Stable  focal opacity in the medial right upper lobe, most of which likely reflects radiation changes when correlating with prior CT, underlying residual/recurrent tumor not excluded. Electronically Signed   By: Julian Hy M.D.   On: 03/04/2016 55:12      A:-year-old female past medical history of diastolic heart failure, COPD, obstructive sleep apnea, adenocarcinoma of the lung status post radiation to right upper lobe, now with acute on chronic CHF and respiratory failure Acute on chronic respiratory failure - hypoxic/hypercapnic Acute CHF exacerbation Bilateral lower lobe atelectasis Bilateral lower lobe pneumonia OSA and CPAP-currently does not have a machine Paroxysmal atrial fibrillation-on anticoagulation, amiodarone CHF - diastolic Acute on Chronic Renal Failure Type 2 diabetes History of adenocarcinoma of the right upper lobe of lung -status post right upper lobe radiation changes History of Cushing's and right adrenal hypodense lesion-asymptomatic Hyperlipidemia   P:   - BiPAP as needed, maintain tidal volumes of 400-450 -Keep O2 saturations  greater than 88%, may supplement oxygen as needed -Patient recently in the hospital for similar episode 1 month prior, she has been followed by her primary care physician closely in the last month, has a history of right upper lobe adenocarcinoma status post radiation, and on amiodarone for atrial fibrillation. We'll plan for a repeat CT without contrast of chest, evaluate lung parenchyma given history of lung cancer and being on amiodarone. I'm not sure if amiodarone is causing persistent opacities in her lower lobes. -Elevated pro calcitonin, with cough and shortness of breath over the past 3 days, empirically treat treat for bilateral lower lobe pneumonia given findings on chest x-ray -Recently switched from Eloquis to Xarelto, due to insurance purposes, on anticoagulation for history of coronary artery disease status post stenting and atrial  fibrillation. -We'll reduce amiodarone from 400 twice a day, to 200 twice a day, Cardizem 10 mg IV every 4 hours as needed for heart rate greater than 130.  Cardiology consult. -may take breaks off BiPAP, can have meds and heart healthy diet when off bipap -Avoid nephrotoxic drugs, monitor renal function -Insulin sliding scale -Patient with history of obstructive sleep apnea, supposed to be on CPAP 15 cm of water, but patient states she does not have a CPAP machine at this time, therefore has not been using CPAP as prescribed for the past 1-2 months. She has [facility] as D[facility]. Social service consult for evaluation of new CPAP machine or financial assistance with getting a new one.  Control her OSA will assist with her dCHF, COPD, and Afib, and could reduce the number of readmissions in the this patient.   .  Rest per NP/medical resident whose note is outlined above and that I agree with  The patient is critically ill with multiple organ systems failure and requires high complexity decision making for assessment and support, frequent evaluation and titration of therapies, application of advanced monitoring technologies and extensive interpretation of multiple databases.   Critical Care Time devoted to patient care services described in this note is 69 Minutes.   This time reflects time of care of this signee Dr Vilinda Boehringer.  This critical care time does not reflect procedure time, or teaching time or supervisory time of PA/NP/Med-student/Med Resident etc but could involve care discussion time.  Vilinda Boehringer, MD [place] Pager [phone] (please enter 7-digits) On Call Pager [phone] (please enter 7-digits)  Note: This note was prepared with Dragon dictation along with smaller phrase technology. Any transcriptional errors that result from this process are unintentional.

## 2016-03-04 NOTE — Progress Notes (Signed)
Patient placed on bipap per arrival to ed.. 10m of abuterol given in line with bipap per Dr SClearnce Hastenverbal order. BBS noted to be diminished with faint exp wheezes.  ABG obtained on this patient.  Results called to ER MD.

## 2016-03-04 NOTE — Progress Notes (Signed)
Pharmacy Antibiotic Note  Regina Burke is a 82 y.o. female admitted on 03/04/2016 with pneumonia.  Pharmacy has been consulted for vancomycin and aztreonam dosing.  Plan: 1. Aztreonam 1 gm IV Q8H 2. Vancomycin 1 gm IV x 1 in ED followed in approximately 20 hours (stacked dosing) by vancomycin 1 gm IV Q48H, predicted trough 16 mcg/mL. Pharmacy will continue to follow and adjust as needed to maintain trough 15 to 20 mcg/ml.   Vd 48.1 L, Ke 0.018 hr-1, T1/2 38.5 hr  Height: '5\' 4"'$  (162.6 cm) Weight: 200 lb (90.719 kg) IBW/kg (Calculated) : 54.7  Temp (24hrs), Avg:98 F (36.7 C), Min:98 F (36.7 C), Max:98 F (36.7 C)   Recent Labs Lab 03/04/16 2100  WBC 15.4*  CREATININE 2.98*    Estimated Creatinine Clearance: 16.4 mL/min (by C-G formula based on Cr of 2.98).    Allergies  Allergen Reactions  . Ciprofloxacin Shortness Of Breath, Itching and Rash  . Doxycycline Shortness Of Breath, Itching and Rash  . Penicillins Shortness Of Breath, Itching, Rash and Other (See Comments)    Has patient had a PCN reaction causing immediate rash, facial/tongue/throat swelling, SOB or lightheadedness with hypotension: Yes Has patient had a PCN reaction causing severe rash involving mucus membranes or skin necrosis: No Has patient had a PCN reaction that required hospitalization No Has patient had a PCN reaction occurring within the last 10 years: No If all of the above answers are "NO", then may proceed with Cephalosporin use.  . Sulfa Antibiotics Shortness Of Breath, Itching and Rash  . Morphine And Related Itching  . Albuterol Sulfate   . Cefuroxime Rash    blisters    Thank you for allowing pharmacy to be a part of this patient's care.  Laural Benes, Pharm.D., BCPS Clinical Pharmacist 03/04/2016 9:43 PM

## 2016-03-04 NOTE — ED Notes (Signed)
Pt to ED via EMS from home emergency traffic c/o respiratory distress.  Per EMS pt has had difficulty x3 days worsening today, 84% on 6L home O2, EMS placed on CPAP with 96% O2, somnolent, pt became cool and clammy en route.  EMS gave 2 duonebs en route.  Pt arrives somnolent, alert to voice.

## 2016-03-04 NOTE — ED Notes (Signed)
Juliann Pulse, RN, charge ICU request temp hold pt until another pt moved, obliged

## 2016-03-04 NOTE — ED Provider Notes (Signed)
[facility] Emergency Department Provider Note  ____________________________________________  Time seen: Seen upon arrival to the emergency department  I have reviewed the triage vital signs and the nursing notes.   HISTORY  Chief Complaint Respiratory Distress   HPI Regina Burke is a 82 y.o. female with a history of COPD as well as CHF who is presenting to the emergency department with 3 days of worsening shortness of breath. Per EMS she was initially 83% on her 6 L baseline nasal cannula oxygen. She has been somnolenten route. IV access was unable to be established. However, EMS was able to give 2 DuoNeb. Patient is able to nod yes and no to questions and nods no to having any pain. Patient with oxygen saturation of 96% on CPAP.   Past Medical History  Diagnosis Date  . COPD (chronic obstructive pulmonary disease) ([place])   . Chronic diastolic CHF (congestive heart failure) ([place])     a. 10/2015 Echo: EF 55-65%, Gr1 DD, mild MR, mildly dil LA, nl RV fxn, nl PASP.  Marland Kitchen Hypertensive heart disease   . HLD (hyperlipidemia)   . Sleep apnea   . Cushing's disease ([place])   . Type II diabetes mellitus ([place])   . Anemia   . GERD (gastroesophageal reflux disease)   . History of hiatal hernia   . Migraine   . Arthritis   . Gout   . Depression   . Asthma   . Coronary artery disease     a. 11/2014 NSTEMI/PCI: LM nl, LAD 41m D1 30, LCX mild dzs, OM1 20p, OM2 292mOM3 90p (2.25x8 Promus Premier DES), RCA nl.   . HOH (hard of hearing)   . Chronic kidney disease (CKD), stage III (moderate)   . Cough     CHRONIC AT NIGHT  . Edema     FEET/LEGS  . History of pneumonia   . Lung cancer (HCAvoyellesdx'd 2014    S/P radiation 2015  . Diverticulitis   . Multiple allergies   . PAF (paroxysmal atrial fibrillation) (HCC)     a. on amio, Toprol XL, Cardizem CD, and Eliquis 2.5 mg bid (age & SCr); b. CHADS2VASc ==> 7 (CHF, HTN, age x 2, DM, vascular disease and sex category)     Patient Active Problem List   Diagnosis Date Noted  . Chronic diastolic CHF (congestive heart failure) (HCAlta06/21/2017  . HLD (hyperlipidemia) 02/16/2016  . Iron deficiency anemia due to chronic blood loss   . Essential hypertension   . Chronic obstructive pulmonary disease with acute exacerbation (HCAdam[place]  . SOB (shortness of breath)   . Acute on chronic renal failure (HCHadar05/24/2017  . Dyspnea 11/25/2015  . Diaphoresis   . Renal insufficiency   . Centrilobular emphysema (HCGarrison  . Carcinoma of lung (HCSouth Miami Heights02/02/2016  . Chronic obstructive pulmonary disease (HCCanon City11/16/2016  . Status post thoracentesis   . Renal failure   . S/P thoracentesis   . H/O: lung cancer   . Pleural effusion   . Respiratory failure (HCLight Oak  . Coronary artery disease involving native coronary artery of native heart without angina pectoris   . S/P coronary artery stent placement   . Pressure ulcer 06/08/2015  . Acute kidney injury (HCScotia10/05/2015  . Chest wall pain 06/04/2015  . Acute respiratory failure (HCRed [place]09/18/2016  . Dehydration 05/09/2015  . Diabetes (HCHatley09/06/2015  . Closed fracture nasal bone 05/09/2015  . Paroxysmal atrial fibrillation (HCSedan09/02/2015  .  Preoperative clearance 04/15/2015  . Cancer of upper lobe of right lung ([place]) 01/08/2015  . Allergic state 01/08/2015  . Absolute anemia 01/08/2015  . Chronic kidney disease 01/08/2015  . CAFL (chronic airflow limitation) ([place]) 01/08/2015  . Arthritis, degenerative 01/08/2015  . 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 Rapid palpitations   . Diabetes mellitus, type 2 ([place]) 09/11/2014  . Type 2 diabetes mellitus ([place]) 09/11/2014    Past Surgical History  Procedure Laterality Date  . Adrenalectomy Left 1980's    "[place]"  . Abdominal hysterectomy    . Appendectomy    . Cholecystectomy    . Coronary angioplasty with stent placement  12/23/2014  . Tonsillectomy    . Fracture surgery    . Wrist fracture surgery Bilateral ~  2000  . Breast cyst excision Left   . Tubal ligation    . Left heart catheterization with coronary angiogram N/A 12/23/2014    Procedure: LEFT HEART CATHETERIZATION WITH CORONARY ANGIOGRAM;  Surgeon: Burnell Blanks, MD;  Location: [facility] CATH LAB;  Service: Cardiovascular;  Laterality: N/A;  . Percutaneous coronary stent intervention (pci-s)  12/23/2014    Procedure: PERCUTANEOUS CORONARY STENT INTERVENTION (PCI-S);  Surgeon: Burnell Blanks, MD;  Location: [facility] CATH LAB;  Service: Cardiovascular;;  Promus 2.25x8  . Cataract extraction w/phaco Right 12/28/2015    Procedure: CATARACT EXTRACTION PHACO AND INTRAOCULAR LENS PLACEMENT (IOC);  Surgeon: Birder Robson, MD;  Location: [facility] ORS;  Service: Ophthalmology;  Laterality: Right;  [place] 48.4 AP% 19.0 CDE 9.17 Fluid Pack Lot # [id number]    Current Outpatient Rx  Name  Route  Sig  Dispense  Refill  . ALPRAZolam (XANAX) 0.25 MG tablet   Oral   Take 0.25 mg by mouth 2 (two) times daily as needed for anxiety.         Marland Kitchen amiodarone (PACERONE) 400 MG tablet   Oral   Take 1 tablet (400 mg total) by mouth 2 (two) times daily.   60 tablet   3   . atorvastatin (LIPITOR) 20 MG tablet   Oral   Take 1 tablet (20 mg total) by mouth every evening.   90 tablet   3   . cholecalciferol (VITAMIN D) 400 UNITS TABS tablet   Oral   Take 800 Units by mouth every evening.          . cyanocobalamin 500 MCG tablet   Oral   Take 500 mcg by mouth every evening.         . diltiazem (CARDIZEM CD) 240 MG 24 hr capsule   Oral   Take 1 capsule (240 mg total) by mouth every evening.   30 capsule   1   . febuxostat (ULORIC) 40 MG tablet   Oral   Take 40 mg by mouth every evening.          . fenofibrate (TRICOR) 48 MG tablet   Oral   Take 48 mg by mouth every evening.          . ferrous sulfate 325 (65 FE) MG EC tablet   Oral   Take 325 mg by mouth every evening.          . fluticasone furoate-vilanterol (BREO ELLIPTA) 100-25  MCG/INH AEPB   Inhalation   Inhale 1 puff into the lungs daily.         . furosemide (LASIX) 20 MG tablet   Oral   Take 1 tablet (20 mg total) by mouth 2 (two) times daily as needed.  60 tablet   3   . metoprolol succinate (TOPROL XL) 25 MG 24 hr tablet   Oral   Take 1 tablet (25 mg total) by mouth 2 (two) times daily.   60 tablet   6   . nitroGLYCERIN (NITROSTAT) 0.4 MG SL tablet   Sublingual   Place 1 tablet (0.4 mg total) under the tongue every 5 (five) minutes as needed for chest pain.   25 tablet   3   . pantoprazole (PROTONIX) 40 MG tablet   Oral   Take 40 mg by mouth every evening.          . Rivaroxaban (XARELTO) 15 MG TABS tablet   Oral   Take 1 tablet (15 mg total) by mouth daily with supper.   90 tablet   1   . sertraline (ZOLOFT) 50 MG tablet   Oral   Take 50 mg by mouth every evening.          . tiotropium (SPIRIVA) 18 MCG inhalation capsule   Inhalation   Place 18 mcg into inhaler and inhale daily.           Allergies Ciprofloxacin; Doxycycline; Penicillins; Sulfa antibiotics; Morphine and related; Albuterol sulfate; and Cefuroxime  Family History  Problem Relation Age of Onset  . Heart disease Mother   . Diabetes Mother   . Osteoarthritis Mother   . Hypertension Mother   . Heart disease Father   . Hypertension Father   . COPD Brother     Social History Social History  Substance Use Topics  . Smoking status: Former Smoker -- 1.00 packs/day for 45 years    Types: Cigarettes    Quit date: 04/25/1998  . Smokeless tobacco: Never Used  . Alcohol Use: No     Comment: 12/23/2014 "might have a couple mixed drinks/year"    Review of Systems Constitutional: No fever/chills Eyes: No visual changes. ENT: No sore throat. Cardiovascular: Denies chest pain. Respiratory: As above Gastrointestinal: No abdominal pain.  No nausea, no vomiting.  No diarrhea.  No constipation. Genitourinary: Negative for dysuria. Musculoskeletal: Negative  for back pain. Skin: Negative for rash. Neurological: Negative for headaches, focal weakness or numbness.  10-point ROS otherwise negative.  ____________________________________________   PHYSICAL EXAM:  VITAL SIGNS: ED Triage Vitals  Enc Vitals Group     BP --      Pulse --      Resp --      Temp --      Temp src --      SpO2 --      Weight --      Height --      Head Cir --      Peak Flow --      Pain Score --      Pain Loc --      Pain Edu? --      Excl. in [place]? --     Constitutional: Somnolent but arousable to voice and opens her eyes. However, quickly dropped her head back down to her chest. Eyes: Conjunctivae are normal. PERRL. EOMI. Head: Atraumatic. Nose: No congestion/rhinnorhea. Mouth/Throat: Mucous membranes are moist.  Oropharynx non-erythematous. Neck: No stridor. Cardiovascular: Irregularly irregular and tachycardic. Grossly normal heart sounds.   Respiratory: Tachypneic with use of accessory muscles. Very poor air movement throughout with coarse wheezing. Gastrointestinal: Soft and nontender. No distention.  Musculoskeletal: No lower extremity tenderness nor edema.  No joint effusions. Neurologic:  Normal speech and language. No gross focal  neurologic deficits are appreciated.  Skin:  Skin is warm, dry and intact. No rash noted. Psychiatric: Mood and affect are normal. Speech and behavior are normal.  ____________________________________________   LABS (all labs ordered are listed, but only abnormal results are displayed)  Labs Reviewed - No data to display ____________________________________________  EKG  ED ECG REPORT I, Schaev[place],  Youlanda Roys, the attending physician, personally viewed and interpreted this ECG.   Date: 03/04/2016  EKG Time: 2102  Rate: 120  Rhythm: atrial fibrillation, rate 120  Axis: Normal axis  Intervals:left bundle branch block  ST&T Change: T wave inversions in 1, aVL as well as V6. No ST  elevations.  ____________________________________________  [id number]  DG Chest 1 View (Final result) Result time: 03/04/16 21:22:18   Final result by Rad Results In Interface (03/04/16 21:22:18)   Narrative:   CLINICAL DATA: Respiratory distress  EXAM: CHEST 1 VIEW  COMPARISON: Chest radiograph dated 01/24/2016. CT chest dated 01/21/2016.  FINDINGS: Patchy bilateral lower lobe opacities, suspicious for pneumonia, less likely atelectasis.  Superimposed mild interstitial edema is suspected. Small bilateral pleural effusions. No pneumothorax.  Again noted is a stable focal opacity in the medial right upper lobe, most of which likely reflects radiation changes when correlating with prior CT, underlying residual/recurrent tumor not excluded.  Cardiomegaly.  IMPRESSION: Patchy bilateral lower lobe opacities, suspicious for pneumonia, less likely atelectasis.  Superimposed mild interstitial edema is suspected. Small bilateral pleural effusions.  Stable focal opacity in the medial right upper lobe, most of which likely reflects radiation changes when correlating with prior CT, underlying residual/recurrent tumor not excluded.   Electronically Signed By: Julian Hy M.D. On: 03/04/2016 21:22       ____________________________________________   PROCEDURES   Procedures  CRITICAL CARE Performed by: Doran Stabler   Total critical care time: 35 minutes  Critical care time was exclusive of separately billable procedures and treating other patients.  Critical care was necessary to treat or prevent imminent or life-threatening deterioration.  Critical care was time spent personally by me on the following activities: development of treatment plan with patient and/or surrogate as well as nursing, discussions with consultants, evaluation of patient's response to treatment, examination of patient, obtaining history from patient or surrogate,  ordering and performing treatments and interventions, ordering and review of laboratory studies, ordering and review of radiographic studies, pulse oximetry and re-evaluation of patient's condition.   ____________________________________________   INITIAL IMPRESSION / ASSESSMENT AND PLAN / ED COURSE  Pertinent labs & imaging results that were available during my care of the patient were reviewed by me and considered in my medical decision making (see chart for details).  ----------------------------------------- 10:12 PM on 03/04/2016 -----------------------------------------  Patient initially somnolent likely secondary to CO2 narcosis. Patient now awake and alert. Family is at the bedside. Sepsis were called in antibiotics ordered. Patient with dramatically decreased laboring with her respirations. Overall with clinical improvement. Will be admitted to the ICU. Signed out to [facility] of the ICU. Plan extended the patient as well as the family who are understanding willing to comply. Patient given broad-spectrum antibiotic coverage secondary to acuity. ____________________________________________   FINAL CLINICAL IMPRESSION(S) / ED DIAGNOSES   Community acquired pneumonia. Hypoxia. Acute kidney failure. COPD. CHF.   NEW MEDICATIONS STARTED DURING THIS VISIT:  New Prescriptions   No medications on file     Note:  This document was prepared using Dragon voice recognition software and may include unintentional dictation errors.    Orbie Pyo,  MD 03/04/16 2216

## 2016-03-05 DIAGNOSIS — I481 Persistent atrial fibrillation: Secondary | ICD-10-CM

## 2016-03-05 LAB — BASIC METABOLIC PANEL
ANION GAP: 9 (ref 5–15)
BUN: 86 mg/dL — ABNORMAL HIGH (ref 6–20)
CHLORIDE: 103 mmol/L (ref 101–111)
CO2: 27 mmol/L (ref 22–32)
Calcium: 8.1 mg/dL — ABNORMAL LOW (ref 8.9–10.3)
Creatinine, Ser: 2.86 mg/dL — ABNORMAL HIGH (ref 0.44–1.00)
GFR calc Af Amer: 17 mL/min — ABNORMAL LOW (ref >60)
GFR, EST NON AFRICAN AMERICAN: 15 mL/min — AB (ref >60)
GLUCOSE: 150 mg/dL — AB (ref 65–99)
POTASSIUM: 4.1 mmol/L (ref 3.5–5.1)
Sodium: 139 mmol/L (ref 135–145)

## 2016-03-05 LAB — GLUCOSE, CAPILLARY
GLUCOSE-CAPILLARY: 165 mg/dL — AB (ref 65–99)
GLUCOSE-CAPILLARY: 274 mg/dL — AB (ref 65–99)
GLUCOSE-CAPILLARY: 163 mg/dL — AB (ref 65–99)
GLUCOSE-CAPILLARY: 118 mg/dL — AB (ref 65–99)
GLUCOSE-CAPILLARY: 234 mg/dL — AB (ref 65–99)
GLUCOSE-CAPILLARY: 158 mg/dL — AB (ref 65–99)
Glucose-Capillary: 124 mg/dL — ABNORMAL HIGH (ref 65–99)
Glucose-Capillary: 213 mg/dL — ABNORMAL HIGH (ref 65–99)

## 2016-03-05 LAB — BLOOD GAS, ARTERIAL
ACID-BASE EXCESS: 3.5 mmol/L — AB (ref 0.0–3.0)
BICARBONATE: 31 meq/L — AB (ref 21.0–28.0)
Delivery systems: POSITIVE
Expiratory PAP: 8
FIO2: 0.45
Inspiratory PAP: 12
MECHANICAL RATE: 10
O2 Saturation: 97.8 %
PATIENT TEMPERATURE: 37
PCO2 ART: 63 mmHg — AB (ref 32.0–48.0)
PH ART: 7.3 — AB (ref 7.350–7.450)
pO2, Arterial: 111 mmHg — ABNORMAL HIGH (ref 83.0–108.0)

## 2016-03-05 LAB — URINALYSIS COMPLETE WITH MICROSCOPIC (ARMC ONLY)
Bilirubin Urine: NEGATIVE
GLUCOSE, UA: NEGATIVE mg/dL
Hgb urine dipstick: NEGATIVE
KETONES UR: NEGATIVE mg/dL
LEUKOCYTES UA: NEGATIVE
NITRITE: NEGATIVE
PROTEIN: 30 mg/dL — AB
Specific Gravity, Urine: 1.014 (ref 1.005–1.030)
pH: 5 (ref 5.0–8.0)

## 2016-03-05 LAB — CBC
HCT: 32.6 % — ABNORMAL LOW (ref 35.0–47.0)
HEMOGLOBIN: 10.9 g/dL — AB (ref 12.0–16.0)
MCH: 27.1 pg (ref 26.0–34.0)
MCHC: 33.5 g/dL (ref 32.0–36.0)
MCV: 80.9 fL (ref 80.0–100.0)
PLATELETS: 446 10*3/uL — AB (ref 150–440)
RBC: 4.03 MIL/uL (ref 3.80–5.20)
RDW: 17.3 % — ABNORMAL HIGH (ref 11.5–14.5)
WBC: 15.5 10*3/uL — AB (ref 3.6–11.0)

## 2016-03-05 LAB — MAGNESIUM: MAGNESIUM: 2.4 mg/dL (ref 1.7–2.4)

## 2016-03-05 LAB — LACTIC ACID, PLASMA: LACTIC ACID, VENOUS: 1 mmol/L (ref 0.5–1.9)

## 2016-03-05 LAB — MRSA PCR SCREENING: MRSA BY PCR: NEGATIVE

## 2016-03-05 LAB — BRAIN NATRIURETIC PEPTIDE: B Natriuretic Peptide: 1124 pg/mL — ABNORMAL HIGH (ref 0.0–100.0)

## 2016-03-05 LAB — PHOSPHORUS: Phosphorus: 5.3 mg/dL — ABNORMAL HIGH (ref 2.5–4.6)

## 2016-03-05 LAB — PROTIME-INR
INR: 1.51
PROTHROMBIN TIME: 18.3 s — AB (ref 11.4–15.0)

## 2016-03-05 LAB — PROCALCITONIN: Procalcitonin: 0.26 ng/mL

## 2016-03-05 MED ORDER — SODIUM CHLORIDE 0.9% FLUSH
3.0000 mL | Freq: Two times a day (BID) | INTRAVENOUS | Status: DC
  Administered 2016-03-05 – 2016-03-08 (×7): 3 mL via INTRAVENOUS

## 2016-03-05 MED ORDER — VANCOMYCIN HCL IN DEXTROSE 1-5 GM/200ML-% IV SOLN
1000.0000 mg | INTRAVENOUS | Status: DC
  Administered 2016-03-05: 1000 mg via INTRAVENOUS
  Filled 2016-03-05: qty 200

## 2016-03-05 MED ORDER — METHYLPREDNISOLONE SODIUM SUCC 40 MG IJ SOLR
40.0000 mg | INTRAMUSCULAR | Status: AC
  Administered 2016-03-06 – 2016-03-10 (×5): 40 mg via INTRAVENOUS
  Filled 2016-03-05 (×6): qty 1

## 2016-03-05 MED ORDER — CHLORHEXIDINE GLUCONATE 0.12 % MT SOLN
15.0000 mL | Freq: Two times a day (BID) | OROMUCOSAL | Status: DC
  Administered 2016-03-05 – 2016-03-14 (×20): 15 mL via OROMUCOSAL
  Filled 2016-03-05 (×14): qty 15

## 2016-03-05 MED ORDER — AZTREONAM 1 G IJ SOLR
1.0000 g | Freq: Three times a day (TID) | INTRAMUSCULAR | Status: DC
  Filled 2016-03-05: qty 1

## 2016-03-05 MED ORDER — CETYLPYRIDINIUM CHLORIDE 0.05 % MT LIQD
7.0000 mL | Freq: Two times a day (BID) | OROMUCOSAL | Status: DC
  Administered 2016-03-05 – 2016-03-14 (×17): 7 mL via OROMUCOSAL

## 2016-03-05 MED ORDER — VANCOMYCIN HCL IN DEXTROSE 1-5 GM/200ML-% IV SOLN: 1000.0000 mg | INTRAVENOUS | Status: DC

## 2016-03-05 MED ORDER — FAMOTIDINE IN NACL 20-0.9 MG/50ML-% IV SOLN
20.0000 mg | Freq: Every day | INTRAVENOUS | Status: DC
  Administered 2016-03-05 – 2016-03-06 (×3): 20 mg via INTRAVENOUS
  Filled 2016-03-05 (×4): qty 50

## 2016-03-05 MED ORDER — LEVALBUTEROL HCL 1.25 MG/0.5ML IN NEBU: 0.2500 mg | INHALATION_SOLUTION | Freq: Four times a day (QID) | RESPIRATORY_TRACT | Status: DC

## 2016-03-05 MED ORDER — IPRATROPIUM BROMIDE 0.02 % IN SOLN
0.5000 mg | RESPIRATORY_TRACT | Status: AC
  Administered 2016-03-05 (×2): 0.5 mg via RESPIRATORY_TRACT
  Filled 2016-03-05 (×2): qty 2.5

## 2016-03-05 MED ORDER — DEXTROSE 5 % IV SOLN
1.0000 g | Freq: Three times a day (TID) | INTRAVENOUS | Status: DC
  Administered 2016-03-05: 1 g via INTRAVENOUS
  Filled 2016-03-05 (×2): qty 1

## 2016-03-05 MED ORDER — FUROSEMIDE 10 MG/ML IJ SOLN
40.0000 mg | Freq: Two times a day (BID) | INTRAMUSCULAR | Status: DC
Start: 2016-03-05 — End: 2016-03-05
  Administered 2016-03-05: 40 mg via INTRAVENOUS
  Filled 2016-03-05 (×2): qty 4

## 2016-03-05 MED ORDER — FUROSEMIDE 10 MG/ML IJ SOLN
40.0000 mg | Freq: Once | INTRAMUSCULAR | Status: AC
  Administered 2016-03-05: 40 mg via INTRAVENOUS
  Filled 2016-03-05: qty 4

## 2016-03-05 MED ORDER — SODIUM CHLORIDE 0.9% FLUSH
3.0000 mL | INTRAVENOUS | Status: DC | PRN
  Administered 2016-03-06: 3 mL via INTRAVENOUS
  Filled 2016-03-05: qty 3

## 2016-03-05 MED ORDER — DEXTROSE 5 % IV SOLN
1.0000 g | Freq: Three times a day (TID) | INTRAVENOUS | Status: DC
  Administered 2016-03-05 – 2016-03-06 (×3): 1 g via INTRAVENOUS
  Filled 2016-03-05 (×6): qty 1

## 2016-03-05 MED ORDER — DILTIAZEM HCL 25 MG/5ML IV SOLN
INTRAVENOUS | Status: AC
  Administered 2016-03-05: 20 mg
  Filled 2016-03-05: qty 5

## 2016-03-05 MED ORDER — SODIUM CHLORIDE 0.9 % IV SOLN: 250.0000 mL | INTRAVENOUS | Status: DC

## 2016-03-05 MED ORDER — SODIUM CHLORIDE 0.9 % IV SOLN
INTRAVENOUS | Status: DC
  Administered 2016-03-06: 06:00:00 via INTRAVENOUS

## 2016-03-05 MED ORDER — AMIODARONE HCL 200 MG PO TABS
200.0000 mg | ORAL_TABLET | Freq: Two times a day (BID) | ORAL | Status: DC
  Administered 2016-03-05 (×2): 200 mg via ORAL
  Filled 2016-03-05 (×2): qty 1

## 2016-03-05 MED ORDER — DILTIAZEM HCL 25 MG/5ML IV SOLN
20.0000 mg | Freq: Once | INTRAVENOUS | Status: AC
  Administered 2016-03-05: 20 mg via INTRAVENOUS

## 2016-03-05 MED ORDER — RANOLAZINE ER 500 MG PO TB12
500.0000 mg | ORAL_TABLET | Freq: Two times a day (BID) | ORAL | Status: DC
  Administered 2016-03-05 – 2016-03-14 (×19): 500 mg via ORAL
  Filled 2016-03-05 (×20): qty 1

## 2016-03-05 MED ORDER — MORPHINE SULFATE (PF) 2 MG/ML IV SOLN
1.0000 mg | Freq: Once | INTRAVENOUS | Status: AC
  Administered 2016-03-05: 1 mg via INTRAVENOUS

## 2016-03-05 MED ORDER — ENOXAPARIN SODIUM 30 MG/0.3ML ~~LOC~~ SOLN
30.0000 mg | Freq: Every day | SUBCUTANEOUS | Status: DC
  Administered 2016-03-05: 30 mg via SUBCUTANEOUS
  Filled 2016-03-05: qty 0.3

## 2016-03-05 MED ORDER — RIVAROXABAN 15 MG PO TABS
15.0000 mg | ORAL_TABLET | Freq: Every day | ORAL | Status: DC
  Administered 2016-03-05 – 2016-03-13 (×9): 15 mg via ORAL
  Filled 2016-03-05 (×11): qty 1

## 2016-03-05 MED ORDER — INSULIN ASPART 100 UNIT/ML ~~LOC~~ SOLN
2.0000 [IU] | SUBCUTANEOUS | Status: DC
  Administered 2016-03-05: 6 [IU] via SUBCUTANEOUS
  Administered 2016-03-05: 4 [IU] via SUBCUTANEOUS
  Filled 2016-03-05: qty 6
  Filled 2016-03-05: qty 4

## 2016-03-05 MED ORDER — LEVALBUTEROL HCL 0.63 MG/3ML IN NEBU
0.6300 mg | INHALATION_SOLUTION | RESPIRATORY_TRACT | Status: AC
Start: 2016-03-05 — End: 2016-03-05
  Administered 2016-03-05 (×2): 0.63 mg via RESPIRATORY_TRACT
  Filled 2016-03-05 (×2): qty 3

## 2016-03-05 MED ORDER — MORPHINE SULFATE (PF) 2 MG/ML IV SOLN
INTRAVENOUS | Status: AC
  Administered 2016-03-05: 1 mg via INTRAVENOUS
  Filled 2016-03-05: qty 1

## 2016-03-05 MED ORDER — FUROSEMIDE 40 MG PO TABS
40.0000 mg | ORAL_TABLET | Freq: Two times a day (BID) | ORAL | Status: DC
  Administered 2016-03-05 – 2016-03-06 (×2): 40 mg via ORAL
  Filled 2016-03-05 (×2): qty 1

## 2016-03-05 MED ORDER — DILTIAZEM HCL 25 MG/5ML IV SOLN: 10.0000 mg | INTRAVENOUS | Status: DC | PRN

## 2016-03-05 MED ORDER — INSULIN ASPART 100 UNIT/ML ~~LOC~~ SOLN
0.0000 [IU] | SUBCUTANEOUS | Status: DC
Start: 2016-03-05 — End: 2016-03-14
  Administered 2016-03-05: 8 [IU] via SUBCUTANEOUS
  Administered 2016-03-05: 4 [IU] via SUBCUTANEOUS
  Administered 2016-03-05: 12 [IU] via SUBCUTANEOUS
  Administered 2016-03-06: 2 [IU] via SUBCUTANEOUS
  Administered 2016-03-06: 4 [IU] via SUBCUTANEOUS
  Administered 2016-03-06 (×2): 2 [IU] via SUBCUTANEOUS
  Administered 2016-03-06: 4 [IU] via SUBCUTANEOUS
  Administered 2016-03-06 – 2016-03-07 (×2): 2 [IU] via SUBCUTANEOUS
  Administered 2016-03-07 (×3): 4 [IU] via SUBCUTANEOUS
  Administered 2016-03-07: 2 [IU] via SUBCUTANEOUS
  Administered 2016-03-08: 3 [IU] via SUBCUTANEOUS
  Administered 2016-03-08: 2 [IU] via SUBCUTANEOUS
  Administered 2016-03-08: 4 [IU] via SUBCUTANEOUS
  Administered 2016-03-08: 2 [IU] via SUBCUTANEOUS
  Administered 2016-03-09: 8 [IU] via SUBCUTANEOUS
  Administered 2016-03-09: 2 [IU] via SUBCUTANEOUS
  Administered 2016-03-09: 12 [IU] via SUBCUTANEOUS
  Administered 2016-03-09 (×2): 4 [IU] via SUBCUTANEOUS
  Administered 2016-03-09: 2 [IU] via SUBCUTANEOUS
  Administered 2016-03-10: 8 [IU] via SUBCUTANEOUS
  Administered 2016-03-10: 4 [IU] via SUBCUTANEOUS
  Administered 2016-03-10: 12 [IU] via SUBCUTANEOUS
  Administered 2016-03-10: 4 [IU] via SUBCUTANEOUS
  Administered 2016-03-10: 2 [IU] via SUBCUTANEOUS
  Administered 2016-03-11 (×2): 4 [IU] via SUBCUTANEOUS
  Administered 2016-03-11 – 2016-03-12 (×5): 2 [IU] via SUBCUTANEOUS
  Administered 2016-03-12: 4 [IU] via SUBCUTANEOUS
  Administered 2016-03-13 (×3): 2 [IU] via SUBCUTANEOUS
  Administered 2016-03-13 – 2016-03-14 (×2): 4 [IU] via SUBCUTANEOUS
  Filled 2016-03-05: qty 2
  Filled 2016-03-05: qty 4
  Filled 2016-03-05 (×5): qty 2
  Filled 2016-03-05: qty 12
  Filled 2016-03-05: qty 4
  Filled 2016-03-05: qty 2
  Filled 2016-03-05: qty 4
  Filled 2016-03-05 (×3): qty 2
  Filled 2016-03-05: qty 8
  Filled 2016-03-05: qty 12
  Filled 2016-03-05: qty 2
  Filled 2016-03-05: qty 4
  Filled 2016-03-05 (×2): qty 2
  Filled 2016-03-05: qty 12
  Filled 2016-03-05: qty 2
  Filled 2016-03-05 (×4): qty 4
  Filled 2016-03-05: qty 8
  Filled 2016-03-05 (×2): qty 4
  Filled 2016-03-05: qty 8
  Filled 2016-03-05: qty 2
  Filled 2016-03-05 (×2): qty 4
  Filled 2016-03-05: qty 3
  Filled 2016-03-05: qty 4
  Filled 2016-03-05: qty 2
  Filled 2016-03-05: qty 4
  Filled 2016-03-05: qty 2
  Filled 2016-03-05: qty 4
  Filled 2016-03-05 (×3): qty 2

## 2016-03-05 NOTE — Progress Notes (Signed)
LCSW reviewed patient consult for financial assistance for C-pap machine and this consult will be forwarded to Care Manager Jeani Hawking No further needs LCSW signing off  Syair Fricker LCSW

## 2016-03-05 NOTE — Care Management Note (Addendum)
Case Management Note  Patient Details  Name: Regina Burke MRN: [id number] Date of Birth: [birth year]  Subjective/Objective:    82yo Mrs Regina Burke was admitted 03/04/16 from home with shortness of breath. Dx: Septic shock due to bilateral lower lobe PNA. Currently on Bipap. Resides at home with her son. Has a walker and cane. PCP=Dr Hande. Chronic home oxygen supplied by [facility]. Currently open to [place] for RN and PT services. Numerous health issues. Hx; A-Fib, CHF, COPD, Lung Cancer, Sleep Apnea.  Case management will follow for discharge planning. Anticipate possible need for home CPAP/ Bipap machine. Will need sleep study results.                Action/Plan:   Expected Discharge Date:                  Expected Discharge Plan:     In-House Referral:     Discharge planning Services     Post Acute Care Choice:    Choice offered to:     DME Arranged:    DME Agency:     HH Arranged:    HH Agency:     Status of Service:     If discussed at [employer] of Stay Meetings, dates discussed:    Additional Comments:  Laniqua Torrens A, RN 03/05/2016, 10:37 AM

## 2016-03-05 NOTE — Consult Note (Signed)
CARDIOLOGY CONSULT NOTE  Patient ID: Regina Burke, MRN: [id number], DOB/AGE: 02-18-1933 82 y.o. Admit date: 03/04/2016 Date of Consult: 03/05/2016  Primary Physician: Tracie Harrier, MD Primary Cardiologist: TG Consulting Physician mungal  Chief Complaint: atrial fibrillation   HPI Regina Burke is a 82 y.o. female  We are asked to see for atrial fibrillation with rapid rates.  She was admitted with acute shortness of breath. She had bilateral lower lobe opacities and elevated white count. A sepsis protocol was initiated.  Described hypotension and fluid resuscitation although I do not see hypotension on any of the recorded vital signs.  She has a history of persistent atrial fibrillation. She was noted last week in the office to have increasing shortness of breath and atrial fibrillation with a heart rate in the 120s occurring in the context of left bundle branch block. Her atrial fibrillation has been a problem for the last 10 days. She actually saw her PCP on Monday for shortness of breath which continued to worsen through the week. She's had no fever chills or sputum production  She has renal insufficiency with a baseline creatinine with creatinine of 1.3-1.8. On admission her creatinine is 2.98  Hemoglobin was 8.96 weeks ago and 11.1 today. There was no intercurrent transfusion. She has been nauseated with decreased by mouth intake.   She has a history of a severe anemia which has caused reluctance with anticoagulation although more recently she has been on Rivaroxaban.  GFR based on her creatinine of 2.98 is 19  She's also been on amiodarone 200 twice a day. Despite this she has had recurrent episodes of atrial fibrillation March, May, and now again each with rapid rates.  When she was seen on Friday by Dr. Deidre Ala, he increased her amiodarone to 400 twice a day perhaps concurrent with her nausea worsening.  Echocardiogram 3/17 LVEF was near-normal 55-60% with left atrial  enlargement (48/2.4/35)  She has a history of coronary artery disease with prior DES stenting of a circumflex 2016.  She has a history of severe anemia as noted. She also has oxygen dependent  COPD and lung cancer and was recently admitted to [facility]  with pneumonia bilateral effusions requiring thoracentesis. She was noted in both March as well as may to have atrial fibrillation with rapid rates contributing to her deteriorated condition  TSH was normal 10/16  CHADS-VASc score greater than or equal to 6 (age-60, gender, hypertension, diabetes, coronary artery disease)    Past Medical History  Diagnosis Date  . COPD (chronic obstructive pulmonary disease) ([place])   . Chronic diastolic CHF (congestive heart failure) ([place])     a. 10/2015 Echo: EF 55-65%, Gr1 DD, mild MR, mildly dil LA, nl RV fxn, nl PASP.  Marland Kitchen Hypertensive heart disease   . HLD (hyperlipidemia)   . Sleep apnea   . Cushing's disease ([place])   . Type II diabetes mellitus ([place])   . Anemia   . GERD (gastroesophageal reflux disease)   . History of hiatal hernia   . Migraine   . Arthritis   . Gout   . Depression   . Asthma   . Coronary artery disease     a. 11/2014 NSTEMI/PCI: LM nl, LAD 72m D1 30, LCX mild dzs, OM1 20p, OM2 232mOM3 90p (2.25x8 Promus Premier DES), RCA nl.   . HOH (hard of hearing)   . Chronic kidney disease (CKD), stage III (moderate)   . Cough     CHRONIC  AT NIGHT  . Edema     FEET/LEGS  . History of pneumonia   . Lung cancer ([place]) dx'd 2014    S/P radiation 2015  . Diverticulitis   . Multiple allergies   . PAF (paroxysmal atrial fibrillation) ([facility])     a. on amio, Toprol XL, Cardizem CD, and Eliquis 2.5 mg bid (age & SCr); b. CHADS2VASc ==> 7 (CHF, HTN, age x 2, DM, vascular disease and sex category)  . Hypertension       Surgical History:  Past Surgical History  Procedure Laterality Date  . Adrenalectomy Left 1980's    "[place]ings"  . Abdominal hysterectomy    . Appendectomy    .  Cholecystectomy    . Coronary angioplasty with stent placement  12/23/2014  . Tonsillectomy    . Fracture surgery    . Wrist fracture surgery Bilateral ~ 2000  . Breast cyst excision Left   . Tubal ligation    . Left heart catheterization with coronary angiogram N/A 12/23/2014    Procedure: LEFT HEART CATHETERIZATION WITH CORONARY ANGIOGRAM;  Surgeon: Burnell Blanks, MD;  Location: [facility] CATH LAB;  Service: Cardiovascular;  Laterality: N/A;  . Percutaneous coronary stent intervention (pci-s)  12/23/2014    Procedure: PERCUTANEOUS CORONARY STENT INTERVENTION (PCI-S);  Surgeon: Burnell Blanks, MD;  Location: [facility] CATH LAB;  Service: Cardiovascular;;  Promus 2.25x8  . Cataract extraction w/phaco Right 12/28/2015    Procedure: CATARACT EXTRACTION PHACO AND INTRAOCULAR LENS PLACEMENT (IOC);  Surgeon: Birder Robson, MD;  Location: [facility] ORS;  Service: Ophthalmology;  Laterality: Right;  [place] 48.4 AP% 19.0 CDE 9.17 Fluid Pack Lot # [id number]     Home Meds: Prior to Admission medications   Medication Sig Start Date End Date Taking? Authorizing Provider  ALPRAZolam (XANAX) 0.25 MG tablet Take 0.25 mg by mouth 2 (two) times daily as needed for anxiety.   Yes Historical Provider, MD  amiodarone (PACERONE) 400 MG tablet Take 1 tablet (400 mg total) by mouth 2 (two) times daily. 03/03/16  Yes Minna Merritts, MD  atorvastatin (LIPITOR) 20 MG tablet Take 1 tablet (20 mg total) by mouth every evening. 12/03/15  Yes Minna Merritts, MD  cholecalciferol (VITAMIN D) 400 UNITS TABS tablet Take 800 Units by mouth every evening.    Yes Historical Provider, MD  cyanocobalamin 500 MCG tablet Take 500 mcg by mouth every evening.   Yes Historical Provider, MD  diltiazem (CARDIZEM CD) 240 MG 24 hr capsule Take 1 capsule (240 mg total) by mouth every evening. 02/16/16  Yes Ryan M Dunn, PA-C  febuxostat (ULORIC) 40 MG tablet Take 40 mg by mouth every evening.    Yes Historical Provider, MD  fenofibrate (TRICOR) 48  MG tablet Take 48 mg by mouth every evening.    Yes Historical Provider, MD  ferrous sulfate 325 (65 FE) MG EC tablet Take 325 mg by mouth every evening.    Yes Historical Provider, MD  fluticasone furoate-vilanterol (BREO ELLIPTA) 100-25 MCG/INH AEPB Inhale 1 puff into the lungs daily.   Yes Historical Provider, MD  furosemide (LASIX) 20 MG tablet Take 20 mg by mouth daily.  03/03/16  Yes Minna Merritts, MD  metoprolol succinate (TOPROL XL) 25 MG 24 hr tablet Take 50 mg by mouth 2 (two) times daily.  03/03/16  Yes Minna Merritts, MD  nitroGLYCERIN (NITROSTAT) 0.4 MG SL tablet Place 1 tablet (0.4 mg total) under the tongue every 5 (five) minutes as needed for chest pain.  12/24/14  Yes Rhonda G Barrett, PA-C  pantoprazole (PROTONIX) 40 MG tablet Take 40 mg by mouth every evening.    Yes Historical Provider, MD  Rivaroxaban (XARELTO) 15 MG TABS tablet Take 1 tablet (15 mg total) by mouth daily with supper. 02/16/16  Yes Ryan M Dunn, PA-C  sertraline (ZOLOFT) 50 MG tablet Take 50 mg by mouth every evening.    Yes Historical Provider, MD  tiotropium (SPIRIVA) 18 MCG inhalation capsule Place 18 mcg into inhaler and inhale daily.   Yes Historical Provider, MD    Inpatient Medications:  . amiodarone  200 mg Oral BID  . antiseptic oral rinse  7 mL Mouth Rinse q12n4p  . aztreonam  1 g Intravenous Q8H  . budesonide (PULMICORT) nebulizer solution  0.5 mg Nebulization BID  . chlorhexidine  15 mL Mouth Rinse BID  . famotidine (PEPCID) IV  20 mg Intravenous QHS  . furosemide  40 mg Oral BID  . insulin aspart  0-24 Units Subcutaneous Q4H  . [START ON 03/06/2016] methylPREDNISolone (SOLU-MEDROL) injection  40 mg Intravenous Q24H  . rivaroxaban  15 mg Oral Q supper  . vancomycin  1,000 mg Intravenous Q48H    Allergies:  Allergies  Allergen Reactions  . Ciprofloxacin Shortness Of Breath, Itching and Rash  . Doxycycline Shortness Of Breath, Itching and Rash  . Penicillins Shortness Of Breath, Itching, Rash  and Other (See Comments)    Has patient had a PCN reaction causing immediate rash, facial/tongue/throat swelling, SOB or lightheadedness with hypotension: Yes Has patient had a PCN reaction causing severe rash involving mucus membranes or skin necrosis: No Has patient had a PCN reaction that required hospitalization No Has patient had a PCN reaction occurring within the last 10 years: No If all of the above answers are "NO", then may proceed with Cephalosporin use.  . Sulfa Antibiotics Shortness Of Breath, Itching and Rash  . Morphine And Related Itching  . Albuterol Sulfate   . Cefuroxime Rash    blisters    Social History   Social History  . Marital Status: Widowed    Spouse Name: N/A  . Number of Children: N/A  . Years of Education: N/A   Occupational History  . retired    Social History Main Topics  . Smoking status: Former Smoker -- 1.00 packs/day for 45 years    Types: Cigarettes    Quit date: 04/25/1998  . Smokeless tobacco: Never Used  . Alcohol Use: No     Comment: 12/23/2014 "might have a couple mixed drinks/year"  . Drug Use: No  . Sexual Activity: No   Other Topics Concern  . Not on file   Social History Narrative   Lives locally with son.  Does not routinely exercise.     Family History  Problem Relation Age of Onset  . Heart disease Mother   . Diabetes Mother   . Osteoarthritis Mother   . Hypertension Mother   . Heart disease Father   . Hypertension Father   . COPD Brother      ROS:  Please see the history of present illness.     All other systems reviewed and negative.    Physical Exam: Blood pressure 129/102, pulse 116, temperature 96.5 F (35.8 C), temperature source Axillary, resp. rate 25, height '5\' 5"'$  (1.651 m), weight 194 lb 3.6 oz (88.1 kg), SpO2 97 %. General: Well developed, well nourished female in no acute distress. Head: Normocephalic, atraumatic, sclera non-icteric, no xanthomas, nares are without  discharge. EENT: normal  Lymph  Nodes:  none Neck: Negative for carotid bruits. JVD Greater than 10 Back:without scoliosis kyphosis Lungs: Labored on oxygen with diffuse bilateral rales Heart: Irregularly irregular rate and rhythm  . No rubs, or gallops appreciated. Abdomen: Soft, non-tender, non-distended with normoactive bowel sounds. No hepatomegaly. No rebound/guarding. No obvious abdominal masses. Msk:  Strength and tone appear normal for age. Extremities: No clubbing or cyanosis.  2 +  edema.  Distal pedal pulses are 2+ and equal bilaterally. Skin: Warm and Dry Neuro: Alert and oriented X 3. CN III-XII intact Grossly normal sensory and motor function . Psych:  Responds to questions appropriately with a normal affect.      Labs: Cardiac Enzymes  Recent Labs  03/04/16 2100  TROPONINI <0.03   CBC Lab Results  Component Value Date   WBC 15.5* 03/05/2016   HGB 10.9* 03/05/2016   HCT 32.6* 03/05/2016   MCV 80.9 03/05/2016   PLT 446* 03/05/2016   PROTIME: No results for input(s): LABPROT, INR in the last 72 hours. Chemistry  Recent Labs Lab 03/05/16 0505  NA 139  K 4.1  CL 103  CO2 27  BUN 86*  CREATININE 2.86*  CALCIUM 8.1*  GLUCOSE 150*   Lipids Lab Results  Component Value Date   CHOL 202* 12/23/2014   HDL 28* 12/23/2014   LDLCALC 107* 12/23/2014   TRIG 337* 12/23/2014   BNP No results found for: PROBNP Thyroid Function Tests: No results for input(s): TSH, T4TOTAL, T3FREE, THYROIDAB in the last 72 hours.  Invalid input(s): FREET3 Miscellaneous No results found for: DDIMER  Radiology/Studies:  Dg Chest 1 View  03/04/2016  CLINICAL DATA:  Respiratory distress EXAM: CHEST 1 VIEW COMPARISON:  Chest radiograph dated 01/24/2016. CT chest dated 01/21/2016. FINDINGS: Patchy bilateral lower lobe opacities, suspicious for pneumonia, less likely atelectasis. Superimposed mild interstitial edema is suspected. Small bilateral pleural effusions. No pneumothorax. Again noted is a stable focal  opacity in the medial right upper lobe, most of which likely reflects radiation changes when correlating with prior CT, underlying residual/recurrent tumor not excluded. Cardiomegaly. IMPRESSION: Patchy bilateral lower lobe opacities, suspicious for pneumonia, less likely atelectasis. Superimposed mild interstitial edema is suspected. Small bilateral pleural effusions. Stable focal opacity in the medial right upper lobe, most of which likely reflects radiation changes when correlating with prior CT, underlying residual/recurrent tumor not excluded. Electronically Signed   By: Julian Hy M.D.   On: 03/04/2016 21:22    EKG: Atrial fibrillation with left bundle branch block heart rate 121   Assessment and Plan:  Atrial fibrillation-persistent  Acute on chronic diastolic heart failure  Acute on chronic renal failure  Coronary artery disease with prior stenting  Morbid obesity  COPD-oxygen dependent  Question pneumonia   The patient has had recurrent atrial fibrillation with frequent hospitalizations associated with aggravation of heart failure. This has occurred despite high-dose amiodarone. In the intermediate, ranolazine may augment the benefits of amiodarone in terms of maintaining sinus rhythm.  The short-term however rate control is paramount; I do not think going to be able to accomplish this with medications and I think cardioversion is indicated. She has been taking her NOAC regularly. This regard, her creatinine clearance is about 15 and so her dose is appropriate.  In the intermediate term, i.e. the next few weeks, I recommended that we undertake dual-chamber pacing with the anticipation of AV junction ablation some time in the weeks thereafter. This will allow control of heart rate which is  I think the biggest trigger for her acute on chronic heart failure episodes.  Her acutely deteriorating renal function is concerning.  It is noteworthy that she was recently started on  diltiazem which can infrequently be associated with deteriorating renal function because of its effects on efferent arterioles.  We will need to use beta blockers for rate control for the time being.  As she is significantly better compared to yesterday and there was a problem in the past with intravenous amiodarone and we want to avoid calcium channel blockers, I would recommend that we let her rates run fast today and pursue cardioversion tomorrow as noted above.          Virl Axe

## 2016-03-05 NOTE — Progress Notes (Signed)
Pharmacy Antibiotic Note  Regina Burke is a 82 y.o. female admitted on 03/04/2016 with pneumonia.  Pharmacy has been consulted for vancomycin and aztreonam dosing.  Plan: 1. Aztreonam 1 gm IV Q8H 2. Vancomycin 1 gm IV x 1 in ED followed in approximately 20 hours (stacked dosing) by vancomycin 1 gm IV Q48H, predicted trough 16 mcg/mL. Pharmacy will continue to follow and adjust as needed to maintain trough 15 to 20 mcg/ml.   Vd 48.1 L, Ke 0.018 hr-1, T1/2 38.5 hr  Height: '5\' 5"'$  (165.1 cm) Weight: 194 lb 3.6 oz (88.1 kg) (done on admission at midnight) IBW/kg (Calculated) : 57  Temp (24hrs), Avg:97.3 F (36.3 C), Min:96.5 F (35.8 C), Max:98 F (36.7 C)   Recent Labs Lab 03/04/16 2100 03/04/16 2140 03/05/16 0040 03/05/16 0505  WBC 15.4*  --   --  15.5*  CREATININE 2.98*  --   --  2.86*  LATICACIDVEN  --  0.9 1.0  --     Estimated Creatinine Clearance: 17.2 mL/min (by C-G formula based on Cr of 2.86).    Allergies  Allergen Reactions  . Ciprofloxacin Shortness Of Breath, Itching and Rash  . Doxycycline Shortness Of Breath, Itching and Rash  . Penicillins Shortness Of Breath, Itching, Rash and Other (See Comments)    Has patient had a PCN reaction causing immediate rash, facial/tongue/throat swelling, SOB or lightheadedness with hypotension: Yes Has patient had a PCN reaction causing severe rash involving mucus membranes or skin necrosis: No Has patient had a PCN reaction that required hospitalization No Has patient had a PCN reaction occurring within the last 10 years: No If all of the above answers are "NO", then may proceed with Cephalosporin use.  . Sulfa Antibiotics Shortness Of Breath, Itching and Rash  . Morphine And Related Itching  . Albuterol Sulfate   . Cefuroxime Rash    blisters    Thank you for allowing pharmacy to be a part of this patient's care.  Olivia Canter, RPh Clinical Pharmacist 03/05/2016 8:36 AM

## 2016-03-05 NOTE — Progress Notes (Signed)
Pt's HR spiked to 140-150s.  Afib noted on monitor.  Pt hypertensive.  Dr. Stevenson Clinch at bedside.  Resp even and labored on bipap.  Lung sounds diminished, Pt given Diltiazem, Lasix, and Morphine per MD order.

## 2016-03-05 NOTE — Progress Notes (Signed)
Pt attempted to eat lunch.  Unable to do so.  Requesting Bipap.  Pt placed back on Bipap, RT Freida informed.

## 2016-03-06 DIAGNOSIS — I5023 Acute on chronic systolic (congestive) heart failure: Secondary | ICD-10-CM

## 2016-03-06 LAB — BASIC METABOLIC PANEL
ANION GAP: 11 (ref 5–15)
BUN: 92 mg/dL — AB (ref 6–20)
CO2: 27 mmol/L (ref 22–32)
Calcium: 8.6 mg/dL — ABNORMAL LOW (ref 8.9–10.3)
Chloride: 100 mmol/L — ABNORMAL LOW (ref 101–111)
Creatinine, Ser: 3.01 mg/dL — ABNORMAL HIGH (ref 0.44–1.00)
GFR calc Af Amer: 16 mL/min — ABNORMAL LOW (ref >60)
GFR, EST NON AFRICAN AMERICAN: 14 mL/min — AB (ref >60)
GLUCOSE: 124 mg/dL — AB (ref 65–99)
POTASSIUM: 4.7 mmol/L (ref 3.5–5.1)
Sodium: 138 mmol/L (ref 135–145)

## 2016-03-06 LAB — GLUCOSE, CAPILLARY
GLUCOSE-CAPILLARY: 171 mg/dL — AB (ref 65–99)
GLUCOSE-CAPILLARY: 139 mg/dL — AB (ref 65–99)
Glucose-Capillary: 122 mg/dL — ABNORMAL HIGH (ref 65–99)
Glucose-Capillary: 127 mg/dL — ABNORMAL HIGH (ref 65–99)
Glucose-Capillary: 149 mg/dL — ABNORMAL HIGH (ref 65–99)
Glucose-Capillary: 177 mg/dL — ABNORMAL HIGH (ref 65–99)

## 2016-03-06 LAB — URINE CULTURE: Culture: NO GROWTH

## 2016-03-06 LAB — PROCALCITONIN: PROCALCITONIN: 0.63 ng/mL

## 2016-03-06 MED ORDER — FUROSEMIDE 10 MG/ML IJ SOLN
40.0000 mg | Freq: Two times a day (BID) | INTRAMUSCULAR | Status: DC
  Administered 2016-03-06 (×2): 40 mg via INTRAVENOUS
  Filled 2016-03-06 (×2): qty 4

## 2016-03-06 MED ORDER — FLUTICASONE FUROATE-VILANTEROL 100-25 MCG/INH IN AEPB
1.0000 | INHALATION_SPRAY | Freq: Every day | RESPIRATORY_TRACT | Status: DC
  Administered 2016-03-06 – 2016-03-14 (×9): 1 via RESPIRATORY_TRACT
  Filled 2016-03-06: qty 28

## 2016-03-06 MED ORDER — AMIODARONE LOAD VIA INFUSION
150.0000 mg | Freq: Once | INTRAVENOUS | Status: AC
  Administered 2016-03-06: 150 mg via INTRAVENOUS
  Filled 2016-03-06: qty 83.34

## 2016-03-06 MED ORDER — TIOTROPIUM BROMIDE MONOHYDRATE 18 MCG IN CAPS
18.0000 ug | ORAL_CAPSULE | Freq: Every day | RESPIRATORY_TRACT | Status: DC
  Administered 2016-03-06 – 2016-03-14 (×9): 18 ug via RESPIRATORY_TRACT
  Filled 2016-03-06 (×2): qty 5

## 2016-03-06 MED ORDER — METOPROLOL TARTRATE 25 MG PO TABS
25.0000 mg | ORAL_TABLET | Freq: Four times a day (QID) | ORAL | Status: DC
  Administered 2016-03-06 – 2016-03-08 (×8): 25 mg via ORAL
  Filled 2016-03-06 (×8): qty 1

## 2016-03-06 MED ORDER — DILTIAZEM HCL 30 MG PO TABS: 30.0000 mg | ORAL_TABLET | Freq: Four times a day (QID) | ORAL | Status: DC

## 2016-03-06 MED ORDER — ONDANSETRON HCL 4 MG/2ML IJ SOLN
4.0000 mg | Freq: Four times a day (QID) | INTRAMUSCULAR | Status: DC | PRN
  Administered 2016-03-06 – 2016-03-14 (×10): 4 mg via INTRAVENOUS
  Filled 2016-03-06 (×9): qty 2

## 2016-03-06 MED ORDER — AMIODARONE HCL IN DEXTROSE 360-4.14 MG/200ML-% IV SOLN
30.0000 mg/h | INTRAVENOUS | Status: DC
  Administered 2016-03-06 – 2016-03-09 (×5): 30 mg/h via INTRAVENOUS
  Filled 2016-03-06 (×5): qty 200

## 2016-03-06 MED ORDER — AMIODARONE HCL IN DEXTROSE 360-4.14 MG/200ML-% IV SOLN
60.0000 mg/h | INTRAVENOUS | Status: DC
  Administered 2016-03-06 (×2): 60 mg/h via INTRAVENOUS
  Filled 2016-03-06 (×2): qty 200

## 2016-03-06 NOTE — Progress Notes (Signed)
1830 rough morning with use of BiPAP. Better afternoon . More alert and hungry. Very weak.

## 2016-03-06 NOTE — Progress Notes (Signed)
PULMONARY / CRITICAL CARE MEDICINE   Name: ALLANNAH KEMPEN MRN: [id number] DOB: 1933/05/05    ADMISSION DATE:  03/04/2016 CONSULTATION DATE:  03/04/16  REFERRING MD: EDP  CHIEF COMPLAINT:Acute shortness of breath  HISTORY OF PRESENT ILLNESS:   Avie Checo is a 82 year old female with past medical history significant for COPD on home oxygen, chronic diastolic congestive heart failure, hyperlipidemia, hypertension, sleep apnea, type 2 diabetes, GERD, depression, gout, chronic kidney disease stage III, lung cancer (diagnosed in 2014) and paroxysmal atrial fibrillation. Patient presented to ED via EMS due to respiratory distress which was ongoing for 3 days. She had worsening shortness of breath today. She bumped her oxygen up to 6 L but was satting only up to 84%. EMS placed her on CPAP and her oxygen sats where up to 96%. Patient was more somnolent and therefore EMS gave 2 DuoNeb's. Patient became more awake and was able to nod appropriately to yes or no questions. Patient was placed on BiPAP upon arrival to the [facility] ED. Her chest x-ray was concerning for bilateral lower lobe opacities with WBC of 15.4. Therefore sepsis protocol was initiated and PCCM team was  Called to admit the patient.   SUBJECTIVE: Patient placed back on bIPAP this AM, still in Afib with hr 140's Due to unstable resp status, patient will NOT be cardioverted +resp failure    No current facility-administered medications on file prior to encounter.   Current Outpatient Prescriptions on File Prior to Encounter  Medication Sig  . ALPRAZolam (XANAX) 0.25 MG tablet Take 0.25 mg by mouth 2 (two) times daily as needed for anxiety.  Marland Kitchen amiodarone (PACERONE) 400 MG tablet Take 1 tablet (400 mg total) by mouth 2 (two) times daily.  Marland Kitchen atorvastatin (LIPITOR) 20 MG tablet Take 1 tablet (20 mg total) by mouth every evening.  . cholecalciferol (VITAMIN D) 400 UNITS TABS tablet Take 800 Units by mouth every  evening.   . cyanocobalamin 500 MCG tablet Take 500 mcg by mouth every evening.  . diltiazem (CARDIZEM CD) 240 MG 24 hr capsule Take 1 capsule (240 mg total) by mouth every evening.  . febuxostat (ULORIC) 40 MG tablet Take 40 mg by mouth every evening.   . fenofibrate (TRICOR) 48 MG tablet Take 48 mg by mouth every evening.   . ferrous sulfate 325 (65 FE) MG EC tablet Take 325 mg by mouth every evening.   . fluticasone furoate-vilanterol (BREO ELLIPTA) 100-25 MCG/INH AEPB Inhale 1 puff into the lungs daily.  . furosemide (LASIX) 20 MG tablet Take 20 mg by mouth daily.   . metoprolol succinate (TOPROL XL) 25 MG 24 hr tablet Take 50 mg by mouth 2 (two) times daily.   . nitroGLYCERIN (NITROSTAT) 0.4 MG SL tablet Place 1 tablet (0.4 mg total) under the tongue every 5 (five) minutes as needed for chest pain.  . pantoprazole (PROTONIX) 40 MG tablet Take 40 mg by mouth every evening.   . Rivaroxaban (XARELTO) 15 MG TABS tablet Take 1 tablet (15 mg total) by mouth daily with supper.  . sertraline (ZOLOFT) 50 MG tablet Take 50 mg by mouth every evening.   . tiotropium (SPIRIVA) 18 MCG inhalation capsule Place 18 mcg into inhaler and inhale daily.   REVIEW OF SYSTEMS:   Unable to obtain as the patient was on BiPAP    VITAL SIGNS: BP 135/60 mmHg  Pulse 123  Temp(Src) 98.4 F (36.9 C) (Axillary)  Resp 22  Ht '5\' 5"'$  (  1.651 m)  Wt 188 lb 15 oz (85.7 kg)  BMI 31.44 kg/m2  SpO2 95%   Vent Mode:  [-]  FiO2 (%):  [40 %] 40 %  INTAKE / OUTPUT: I/O last 3 completed shifts: In: 784.3 [I.V.:534.3; IV Piggyback:250] Out: 1610 [RUEAV:[id number]]  PHYSICAL EXAMINATION: General:Elderly white female found on BiPAP  Neuro:   follows commands  HEENT: Atraumatic, normocephalic, no discharge noted  Cardiovascular:  Irregular, S1 and S2, no murmur rub or gallop noted  Lungs:  Tachypneic, coarse, faint wheezes throughout Abdomen:  Soft, nontender, active bowel sounds Musculoskeletal:  No  inflammation/deformity noted Skin:  Grossly intact  LABS:  BMET  Recent Labs Lab 03/04/16 2100 03/05/16 0505 03/06/16 0433  NA 136 139 138  K 4.3 4.1 4.7  CL 96* 103 100*  CO2 '30 27 27  '$ BUN 86* 86* 92*  CREATININE 2.98* 2.86* 3.01*  GLUCOSE 273* 150* 124*    Electrolytes  Recent Labs Lab 03/04/16 2100 03/05/16 0505 03/06/16 0433  CALCIUM 8.8* 8.1* 8.6*  MG  --  2.4  --   PHOS  --  5.3*  --     CBC  Recent Labs Lab 03/04/16 2100 03/05/16 0505  WBC 15.4* 15.5*  HGB 11.1* 10.9*  HCT 33.6* 32.6*  PLT 482* 446*    Coag's  Recent Labs Lab 03/05/16 0722  INR 1.51    Sepsis Markers  Recent Labs Lab 03/04/16 2100 03/04/16 2140 03/05/16 0040 03/05/16 0505 03/06/16 0433  LATICACIDVEN  --  0.9 1.0  --   --   PROCALCITON <0.10  --   --  0.26 0.63    ABG  Recent Labs Lab 03/04/16 2111 03/05/16 0454  PHART 7.23* 7.30*  PCO2ART 80* 63*  PO2ART 83 111*    Liver Enzymes No results for input(s): AST, ALT, ALKPHOS, BILITOT, ALBUMIN in the last 168 hours.  Cardiac Enzymes  Recent Labs Lab 03/04/16 2100  TROPONINI <0.03    Glucose  Recent Labs Lab 03/05/16 1551 03/05/16 1947 03/05/16 2133 03/05/16 2358 03/06/16 0411 03/06/16 0748  GLUCAP 118* 234* 158* 124* 122* 149*    Imaging No results found.   STUDIES:  none  CULTURES: 7/8 blood cultures >> 7/18 urine culture >> 7/8 sputum culture >>  ANTIBIOTICS: 7/8 vancomycin>> 7/8 azactam>>  SIGNIFICANT EVENTS: 32/29 >>82 year old female with various comorbidities now presenting to the ED bilateral lower lobe pneumonia requiring BiPAP  LINES/TUBES: None  DISCUSSION: 82 year old female with multiple comorbidities such as COPD, CHF, chronic kidney disease, hypertension, sleep apnea now presenting with bilateral lower lobe opacities likely related to acute CHF with uncontrolled Afib   ASSESSMENT / PLAN:  PULMONARY A:  Acute on chronic respiratory failure History of sleep  apnea  Hx of right upper lobe cancer  P:   Continue BiPAP Routine ABG Will d/c Vancomycin/Azactam Continue Bronchodilators Continue Solu-Medrol  CARDIOVASCULAR A:  Shock possible cariogenic Check LA Hypotension History of hypertension Atrial fibrillation History of Hyperlipidemia History of chronic diastolic CHF  P:  Resuscitate with I/V Fluids Keep MAP goals>65 cardiology will NOT cardiovert today Follow up recs  RENAL A:   Acute on chronic kidney disease P:   Replace electrolytes per ICU protocol Follow chemistry   GASTROINTESTINAL A:   History of GERD P:  Nothing by mouth for now  Pepcid   HEMATOLOGIC A:   No active issues P:  Transfuse if Hgb< 7 lovenox for DVT prophylaxis  INFECTIOUS D/c abx P:  I/v fluids Follow cultures MAP Goals>65 Pro-calcitonin<0.10,  lactic acid-0.9  ENDOCRINE A:   Diabetes Melitus type-2 P:   Blood sugar checks q4 hours ssi coverage  NEUROLOGIC A:   Hx of depression P:   RASS goal:0 Will hold zoloft for now  The Patient requires high complexity decision making for assessment and support, frequent evaluation and titration of therapies, application of advanced monitoring technologies and extensive interpretation of multiple databases. Critical Care Time devoted to patient care services described in this note is 45 minutes.   Overall, patient is critically ill, prognosis is guarded.  Patient with Multiorgan failure and at high risk for cardiac arrest and death Recommend DNR/DNI status, consider palliative care consult.  Corrin Parker, M.D.  [facility]  Medical Director [place] Director [facility] Cardio[facility] Department

## 2016-03-06 NOTE — Progress Notes (Signed)
[facility]  Patient Status: Active  [facility] is providing the following services: SN  If patient discharges after hours, please call [phone].   Florene Glen 03/06/2016, 9:23 AM

## 2016-03-06 NOTE — Progress Notes (Signed)
[facility]  ROUNDING NOTE   Subjective:   Seen this morning. Son at bedside. Patient communicating through [place].   UOP 1375 Creatinine 3.01 (2.86)  Cardioversion postponed.   Objective:  Vital signs in last 24 hours:  Temp:  [98.4 F (36.9 C)] 98.4 F (36.9 C) (07/09 2000) Pulse Rate:  [102-128] 123 (07/10 0800) Resp:  [12-26] 22 (07/10 0800) BP: (90-135)/(59-106) 135/60 mmHg (07/10 0800) SpO2:  [95 %-100 %] 95 % (07/10 0805) FiO2 (%):  [40 %] 40 % (07/10 0530) Weight:  [85.7 kg (188 lb 15 oz)] 85.7 kg (188 lb 15 oz) (07/10 0500)  Weight change: -5.019 kg (-11 lb 1.1 oz) Filed Weights   03/05/16 0020 03/05/16 0458 03/06/16 0500  Weight: 88.1 kg (194 lb 3.6 oz) 88.1 kg (194 lb 3.6 oz) 85.7 kg (188 lb 15 oz)    Intake/Output: I/O last 3 completed shifts: In: 784.3 [I.V.:534.3; IV Piggyback:250] Out: 6333 [Urine:1375]   Intake/Output this shift:  Total I/O In: 25 [I.V.:25] Out: 150 [Urine:150]  Physical Exam: General: Critically Ill, laying in bed  Head: +BiPap  Eyes: Anicteric, PERRL  Neck: Supple, trachea midline  Lungs:  Bilateral crackles  Heart: Tachycardia, irregular  Abdomen:  Soft, nontender,   Extremities:  trace peripheral edema.  Neurologic: Nonfocal, moving all four extremities  Skin: No lesions       Basic Metabolic Panel:  Recent Labs Lab 03/04/16 2100 03/05/16 0505 03/06/16 0433  NA 136 139 138  K 4.3 4.1 4.7  CL 96* 103 100*  CO2 '30 27 27  '$ GLUCOSE 273* 150* 124*  BUN 86* 86* 92*  CREATININE 2.98* 2.86* 3.01*  CALCIUM 8.8* 8.1* 8.6*  MG  --  2.4  --   PHOS  --  5.3*  --     Liver Function Tests: No results for input(s): AST, ALT, ALKPHOS, BILITOT, PROT, ALBUMIN in the last 168 hours. No results for input(s): LIPASE, AMYLASE in the last 168 hours. No results for input(s): AMMONIA in the last 168 hours.  CBC:  Recent Labs Lab 03/04/16 2100 03/05/16 0505  WBC 15.4* 15.5*  NEUTROABS 12.1*  --   HGB 11.1*  10.9*  HCT 33.6* 32.6*  MCV 80.7 80.9  PLT 482* 446*    Cardiac Enzymes:  Recent Labs Lab 03/04/16 2100  TROPONINI <0.03    BNP: Invalid input(s): POCBNP  CBG:  Recent Labs Lab 03/05/16 1947 03/05/16 2133 03/05/16 2358 03/06/16 0411 03/06/16 0748  GLUCAP 234* 158* 124* 122* 149*    Microbiology: Results for orders placed or performed during the hospital encounter of 03/04/16  Blood Culture (routine x 2)     Status: None (Preliminary result)   Collection Time: 03/04/16  9:40 PM  Result Value Ref Range Status   Specimen Description BLOOD LEFT HAND  Final   Special Requests   Final    BOTTLES DRAWN AEROBIC AND ANAEROBIC 11CCAERO,12CCANA   Culture NO GROWTH 2 DAYS  Final   Report Status PENDING  Incomplete  Blood Culture (routine x 2)     Status: None (Preliminary result)   Collection Time: 03/04/16  9:40 PM  Result Value Ref Range Status   Specimen Description BLOOD RIGHT HAND  Final   Special Requests   Final    BOTTLES DRAWN AEROBIC AND ANAEROBIC [place]   Culture NO GROWTH 2 DAYS  Final   Report Status PENDING  Incomplete  MRSA PCR Screening     Status: None   Collection Time: 03/05/16 12:30  AM  Result Value Ref Range Status   MRSA by PCR NEGATIVE NEGATIVE Final    Comment:        The GeneXpert MRSA Assay (FDA approved for NASAL specimens only), is one component of a comprehensive MRSA colonization surveillance program. It is not intended to diagnose MRSA infection nor to guide or monitor treatment for MRSA infections.     Coagulation Studies:  Recent Labs  03/05/16 0722  LABPROT 18.3*  INR 1.51    Urinalysis:  Recent Labs  03/05/16 1033  COLORURINE YELLOW*  LABSPEC 1.014  PHURINE 5.0  GLUCOSEU NEGATIVE  HGBUR NEGATIVE  BILIRUBINUR NEGATIVE  KETONESUR NEGATIVE  PROTEINUR 30*  NITRITE NEGATIVE  LEUKOCYTESUR NEGATIVE      Imaging: Dg Chest 1 View  03/04/2016  CLINICAL DATA:  Respiratory distress EXAM: CHEST 1 VIEW  COMPARISON:  Chest radiograph dated 01/24/2016. CT chest dated 01/21/2016. FINDINGS: Patchy bilateral lower lobe opacities, suspicious for pneumonia, less likely atelectasis. Superimposed mild interstitial edema is suspected. Small bilateral pleural effusions. No pneumothorax. Again noted is a stable focal opacity in the medial right upper lobe, most of which likely reflects radiation changes when correlating with prior CT, underlying residual/recurrent tumor not excluded. Cardiomegaly. IMPRESSION: Patchy bilateral lower lobe opacities, suspicious for pneumonia, less likely atelectasis. Superimposed mild interstitial edema is suspected. Small bilateral pleural effusions. Stable focal opacity in the medial right upper lobe, most of which likely reflects radiation changes when correlating with prior CT, underlying residual/recurrent tumor not excluded. Electronically Signed   By: Julian Hy M.D.   On: 03/04/2016 21:22     Medications:   . sodium chloride    . sodium chloride 25 mL/hr at 03/06/16 0700  . amiodarone 60 mg/hr (03/06/16 0926)   Followed by  . amiodarone     . antiseptic oral rinse  7 mL Mouth Rinse q12n4p  . aztreonam  1 g Intravenous Q8H  . budesonide (PULMICORT) nebulizer solution  0.5 mg Nebulization BID  . chlorhexidine  15 mL Mouth Rinse BID  . famotidine (PEPCID) IV  20 mg Intravenous QHS  . furosemide  40 mg Intravenous Q12H  . insulin aspart  0-24 Units Subcutaneous Q4H  . methylPREDNISolone (SOLU-MEDROL) injection  40 mg Intravenous Q24H  . metoprolol tartrate  25 mg Oral Q6H  . ranolazine  500 mg Oral BID  . rivaroxaban  15 mg Oral Q supper  . sodium chloride flush  3 mL Intravenous Q12H  . vancomycin  1,000 mg Intravenous Q48H   sodium chloride, diltiazem, ondansetron (ZOFRAN) IV, sodium chloride flush  Assessment/ Plan:  Regina Burke is a 82 y.o. white female with diabetes mellitus type II, coronary artery disease, atrial fibrillation, hypertension,  hyperlipidemia, generalized anxiety disorder, gout, anemia, history of lung cancer, COPD, atrial fibrillation, fatty liver disease, GERD, osteoporosis   1. Acute Renal Failure on Chronic kidney disease stage III with history of hyperkalemia and proteinuria: Baseline creatinine of 1.5, eGFR of 32.  Currently off ACE-I/ARB  due to hyperkalemia.   Chronic Kidney disease secondary to diabetes and hypertension.  - Monitor potassium level. No acute indication for dialysis. Continue to monitor urine output and volume status.  - Continue fursoemide. Nonoliguric urine output.   2. Hypertension, atrial fibrillation with rapid ventricular response. Placed on amiodarone gtt.  - Continue diltiazem and metoprolol for rate control.   3. Anemia with chronic kidney disease: Hemoglobin stable.    LOS: 2 Natsuko Kelsay 7/10/201710:33 AM

## 2016-03-06 NOTE — Progress Notes (Signed)
SUBJECTIVE:  The patient's respiratory status worsened this morning with respiratory distress that required BiPAP. She denies any chest pain. She continues to be in atrial fibrillation with rapid ventricular response.   Filed Vitals:   03/06/16 0600 03/06/16 0700 03/06/16 0800 03/06/16 0805  BP: 122/76 116/87 135/60   Pulse: 109 113 123   Temp:      TempSrc:      Resp: '17 16 22   '$ Height:      Weight:      SpO2: 100% 100% 95% 95%    Intake/Output Summary (Last 24 hours) at 03/06/16 0851 Last data filed at 03/06/16 0800  Gross per 24 hour  Intake  315.5 ml  Output   1525 ml  Net -1209.5 ml    LABS: Basic Metabolic Panel:  Recent Labs  03/05/16 0505 03/06/16 0433  NA 139 138  K 4.1 4.7  CL 103 100*  CO2 27 27  GLUCOSE 150* 124*  BUN 86* 92*  CREATININE 2.86* 3.01*  CALCIUM 8.1* 8.6*  MG 2.4  --   PHOS 5.3*  --    Liver Function Tests: No results for input(s): AST, ALT, ALKPHOS, BILITOT, PROT, ALBUMIN in the last 72 hours. No results for input(s): LIPASE, AMYLASE in the last 72 hours. CBC:  Recent Labs  03/04/16 2100 03/05/16 0505  WBC 15.4* 15.5*  NEUTROABS 12.1*  --   HGB 11.1* 10.9*  HCT 33.6* 32.6*  MCV 80.7 80.9  PLT 482* 446*   Cardiac Enzymes:  Recent Labs  03/04/16 2100  TROPONINI <0.03   BNP: Invalid input(s): POCBNP D-Dimer: No results for input(s): DDIMER in the last 72 hours. Hemoglobin A1C: No results for input(s): HGBA1C in the last 72 hours. Fasting Lipid Panel: No results for input(s): CHOL, HDL, LDLCALC, TRIG, CHOLHDL, LDLDIRECT in the last 72 hours. Thyroid Function Tests: No results for input(s): TSH, T4TOTAL, T3FREE, THYROIDAB in the last 72 hours.  Invalid input(s): FREET3 Anemia Panel: No results for input(s): VITAMINB12, FOLATE, FERRITIN, TIBC, IRON, RETICCTPCT in the last 72 hours.   PHYSICAL EXAM General: Well developed, well nourished, In moderate respiratory distress HEENT:  Normocephalic and atramatic Neck:   No JVD.  Lungs: Bibasilar crackles Heart:  irregularly irregular and tachycardic.  Normal S1 and S2 without gallops or murmurs.  Abdomen: Bowel sounds are positive, abdomen soft and non-tender  Msk:  Back normal, normal gait. Normal strength and tone for age. Extremities: No clubbing, cyanosis or edema.   Neuro: Alert and oriented X 3. Psych:  Good affect, responds appropriately  TELEMETRY: Reviewed telemetry pt in  atrial fibrillation with rapid ventricular response  ASSESSMENT AND PLAN:  1. Atrial fibrillation with rapid ventricular response: The plan was to proceed with cardioversion today. However, respiratory status worsened this morning requiring BiPAP. Thus, I don't think it's safe to proceed with cardioversion today. I elected to switch amiodarone to IV given that she converted to sinus rhythm in the past with IV amiodarone. I also added metoprolol for rate control. Continue anticoagulation with renally adjusted dose Xarelto.   once respiratory status improves, we can reschedule cardioversion. Dr. Caryl Comes suggested dual chamber pacemaker with AV nodal ablation as a definitive treatment.  2. Acute on chronic diastolic heart failure: She appears to be volume overloaded which is likely responsible for her deteriorating respiratory status. She has underlying chronic kidney disease which makes diuresis harder. I switched furosemide to 40 mg IV twice daily.  3. Essential hypertension: Metoprolol was added.  4. Coronary  artery disease involving native coronary arteries without angina: Continue medical therapy.  5. COPD: Management per pulmonary. This  Kathlyn Sacramento, MD, [facility] 03/06/2016 8:51 AM

## 2016-03-07 ENCOUNTER — Encounter
Admission: EM | Disposition: A | Discharge status: Home / Self Care | Payer: Self-pay | Source: Home / Self Care | Attending: Internal Medicine

## 2016-03-07 ENCOUNTER — Inpatient Hospital Stay: Payer: Medicare Other

## 2016-03-07 DIAGNOSIS — I4819 Other persistent atrial fibrillation: Secondary | ICD-10-CM

## 2016-03-07 DIAGNOSIS — I2510 Atherosclerotic heart disease of native coronary artery without angina pectoris: Secondary | ICD-10-CM

## 2016-03-07 DIAGNOSIS — I5043 Acute on chronic combined systolic (congestive) and diastolic (congestive) heart failure: Secondary | ICD-10-CM | POA: Diagnosis present

## 2016-03-07 DIAGNOSIS — I10 Essential (primary) hypertension: Secondary | ICD-10-CM

## 2016-03-07 HISTORY — PX: ELECTROPHYSIOLOGIC STUDY: SHX172A

## 2016-03-07 LAB — GLUCOSE, CAPILLARY
GLUCOSE-CAPILLARY: 118 mg/dL — AB (ref 65–99)
GLUCOSE-CAPILLARY: 131 mg/dL — AB (ref 65–99)
Glucose-Capillary: 161 mg/dL — ABNORMAL HIGH (ref 65–99)
Glucose-Capillary: 163 mg/dL — ABNORMAL HIGH (ref 65–99)
Glucose-Capillary: 166 mg/dL — ABNORMAL HIGH (ref 65–99)
Glucose-Capillary: 98 mg/dL (ref 65–99)

## 2016-03-07 LAB — RENAL FUNCTION PANEL
ALBUMIN: 3.6 g/dL (ref 3.5–5.0)
ANION GAP: 11 (ref 5–15)
BUN: 96 mg/dL — ABNORMAL HIGH (ref 6–20)
CALCIUM: 8.7 mg/dL — AB (ref 8.9–10.3)
CO2: 30 mmol/L (ref 22–32)
Chloride: 96 mmol/L — ABNORMAL LOW (ref 101–111)
Creatinine, Ser: 3.08 mg/dL — ABNORMAL HIGH (ref 0.44–1.00)
GFR, EST AFRICAN AMERICAN: 15 mL/min — AB (ref >60)
GFR, EST NON AFRICAN AMERICAN: 13 mL/min — AB (ref >60)
GLUCOSE: 115 mg/dL — AB (ref 65–99)
PHOSPHORUS: 6.1 mg/dL — AB (ref 2.5–4.6)
POTASSIUM: 4.3 mmol/L (ref 3.5–5.1)
SODIUM: 137 mmol/L (ref 135–145)

## 2016-03-07 LAB — CBC
HCT: 28.2 % — ABNORMAL LOW (ref 35.0–47.0)
HEMOGLOBIN: 9.5 g/dL — AB (ref 12.0–16.0)
MCH: 27.1 pg (ref 26.0–34.0)
MCHC: 33.6 g/dL (ref 32.0–36.0)
MCV: 80.5 fL (ref 80.0–100.0)
Platelets: 245 10*3/uL (ref 150–440)
RBC: 3.5 MIL/uL — AB (ref 3.80–5.20)
RDW: 16.9 % — ABNORMAL HIGH (ref 11.5–14.5)
WBC: 8.5 10*3/uL (ref 3.6–11.0)

## 2016-03-07 SURGERY — CARDIOVERSION (CATH LAB)
Anesthesia: General

## 2016-03-07 MED ORDER — SERTRALINE HCL 50 MG PO TABS
50.0000 mg | ORAL_TABLET | Freq: Every day | ORAL | Status: DC
  Administered 2016-03-07 – 2016-03-14 (×8): 50 mg via ORAL
  Filled 2016-03-07 (×8): qty 1

## 2016-03-07 MED ORDER — DOCUSATE SODIUM 50 MG/5ML PO LIQD
100.0000 mg | Freq: Every day | ORAL | Status: DC | PRN
  Administered 2016-03-07: 100 mg via ORAL
  Filled 2016-03-07: qty 10

## 2016-03-07 MED ORDER — ALPRAZOLAM 0.25 MG PO TABS
0.2500 mg | ORAL_TABLET | Freq: Every day | ORAL | Status: DC
  Administered 2016-03-07 – 2016-03-13 (×7): 0.25 mg via ORAL
  Filled 2016-03-07 (×7): qty 1

## 2016-03-07 MED ORDER — SODIUM CHLORIDE 0.9% FLUSH: 3.0000 mL | INTRAVENOUS | Status: DC | PRN

## 2016-03-07 MED ORDER — SODIUM CHLORIDE 0.9 % IV SOLN: 250.0000 mL | INTRAVENOUS | Status: DC

## 2016-03-07 MED ORDER — SODIUM CHLORIDE 0.9% FLUSH
3.0000 mL | Freq: Two times a day (BID) | INTRAVENOUS | Status: DC
  Administered 2016-03-07 – 2016-03-08 (×3): 3 mL via INTRAVENOUS

## 2016-03-07 MED ORDER — FAMOTIDINE 20 MG PO TABS
20.0000 mg | ORAL_TABLET | Freq: Every day | ORAL | Status: DC
  Administered 2016-03-07 – 2016-03-13 (×7): 20 mg via ORAL
  Filled 2016-03-07 (×7): qty 1

## 2016-03-07 MED ORDER — FUROSEMIDE 10 MG/ML IJ SOLN
20.0000 mg | Freq: Two times a day (BID) | INTRAMUSCULAR | Status: DC
  Administered 2016-03-07 – 2016-03-09 (×4): 20 mg via INTRAVENOUS
  Filled 2016-03-07 (×3): qty 2

## 2016-03-07 NOTE — Progress Notes (Signed)
Patient: Regina Burke / Admit Date: 03/04/2016 / Date of Encounter: 03/07/2016, 7:48 AM   Subjective: DCCV postponed on 7/10 secondary to increased SOB requiring BiPAP. She took off her BiPAP overnight. No on nasal cannula at 2 L. Did not sleep well 2/2 IV beeping. Breathing better this morning. Heart rate in the 90's to low 100's bpm on IV amiodarone and metoprolol. Switched to IV Lasix 40 mg bid on 7/10. Renal remains elevated at 3.08, though stable from prior reading. Minus 1 L for the admission and 468 mL for the past 24 hours. Leukocytosis resolved.   Review of Systems: Review of Systems  Constitutional: Positive for weight loss and malaise/fatigue. Negative for fever, chills and diaphoresis.  HENT: Negative for congestion.   Eyes: Negative for discharge and redness.  Respiratory: Positive for cough and shortness of breath. Negative for hemoptysis, sputum production and wheezing.   Cardiovascular: Negative for chest pain, palpitations, orthopnea, claudication, leg swelling and PND.  Gastrointestinal: Negative for nausea and vomiting.  Musculoskeletal: Negative for falls.  Skin: Negative for rash.  Neurological: Positive for weakness. Negative for dizziness, speech change, focal weakness and loss of consciousness.  Endo/Heme/Allergies: Does not bruise/bleed easily.  Psychiatric/Behavioral: The patient is not nervous/anxious.     Objective: Telemetry: Afin, 90's to low 100's bpm Physical Exam: Blood pressure 92/61, pulse 110, temperature 98.5 F (36.9 C), temperature source Oral, resp. rate 13, height '5\' 5"'$  (1.651 m), weight 190 lb 0.6 oz (86.2 kg), SpO2 95 %. Body mass index is 31.62 kg/(m^2). General: Well developed, well nourished, in no acute distress. Head: Normocephalic, atraumatic, sclera non-icteric, no xanthomas, nares are without discharge. Neck: Negative for carotid bruits. JVP not elevated. Lungs: Bibasilar crackles. Breathing is unlabored. Heart:  Irregularly-irregular, S1 S2 without murmurs, rubs, or gallops.  Abdomen: Soft, non-tender, non-distended with normoactive bowel sounds. No rebound/guarding. Extremities: No clubbing or cyanosis. No edema. Distal pedal pulses are 2+ and equal bilaterally. Neuro: Alert and oriented X 3. Moves all extremities spontaneously. Psych:  Responds to questions appropriately with a normal affect.   Intake/Output Summary (Last 24 hours) at 03/07/16 0748 Last data filed at 03/07/16 0600  Gross per 24 hour  Intake  956.2 ml  Output   1425 ml  Net -468.8 ml    Inpatient Medications:  . antiseptic oral rinse  7 mL Mouth Rinse q12n4p  . chlorhexidine  15 mL Mouth Rinse BID  . famotidine (PEPCID) IV  20 mg Intravenous QHS  . fluticasone furoate-vilanterol  1 puff Inhalation Daily  . furosemide  40 mg Intravenous Q12H  . insulin aspart  0-24 Units Subcutaneous Q4H  . methylPREDNISolone (SOLU-MEDROL) injection  40 mg Intravenous Q24H  . metoprolol tartrate  25 mg Oral Q6H  . ranolazine  500 mg Oral BID  . rivaroxaban  15 mg Oral Q supper  . sodium chloride flush  3 mL Intravenous Q12H  . tiotropium  18 mcg Inhalation Daily   Infusions:  . sodium chloride    . sodium chloride 25 mL/hr at 03/06/16 0700  . amiodarone 30 mg/hr (03/06/16 2312)    Labs:  Recent Labs  03/05/16 0505 03/06/16 0433 03/07/16 0447  NA 139 138 137  K 4.1 4.7 4.3  CL 103 100* 96*  CO2 '27 27 30  '$ GLUCOSE 150* 124* 115*  BUN 86* 92* 96*  CREATININE 2.86* 3.01* 3.08*  CALCIUM 8.1* 8.6* 8.7*  MG 2.4  --   --   PHOS 5.3*  --  6.1*    Recent Labs  03/07/16 0447  ALBUMIN 3.6    Recent Labs  03/04/16 2100 03/05/16 0505 03/07/16 0447  WBC 15.4* 15.5* 8.5  NEUTROABS 12.1*  --   --   HGB 11.1* 10.9* 9.5*  HCT 33.6* 32.6* 28.2*  MCV 80.7 80.9 80.5  PLT 482* 446* 245    Recent Labs  03/04/16 2100  TROPONINI <0.03   Invalid input(s): POCBNP No results for input(s): HGBA1C in the last 72 hours.    Weights: Filed Weights   03/05/16 0458 03/06/16 0500 03/07/16 0519  Weight: 194 lb 3.6 oz (88.1 kg) 188 lb 15 oz (85.7 kg) 190 lb 0.6 oz (86.2 kg)     Radiology/Studies:  Dg Chest 1 View  03/04/2016  CLINICAL DATA:  Respiratory distress EXAM: CHEST 1 VIEW COMPARISON:  Chest radiograph dated 01/24/2016. CT chest dated 01/21/2016. FINDINGS: Patchy bilateral lower lobe opacities, suspicious for pneumonia, less likely atelectasis. Superimposed mild interstitial edema is suspected. Small bilateral pleural effusions. No pneumothorax. Again noted is a stable focal opacity in the medial right upper lobe, most of which likely reflects radiation changes when correlating with prior CT, underlying residual/recurrent tumor not excluded. Cardiomegaly. IMPRESSION: Patchy bilateral lower lobe opacities, suspicious for pneumonia, less likely atelectasis. Superimposed mild interstitial edema is suspected. Small bilateral pleural effusions. Stable focal opacity in the medial right upper lobe, most of which likely reflects radiation changes when correlating with prior CT, underlying residual/recurrent tumor not excluded. Electronically Signed   By: Julian Hy M.D.   On: 03/04/2016 21:22     Assessment and Plan  Active Problems:   Acute respiratory failure (HCC)   Paroxysmal atrial fibrillation (HCC)   Acute on chronic diastolic CHF (congestive heart failure), NYHA class 1 (HCC)   Coronary artery disease involving native coronary artery of native heart without angina pectoris   Acute on chronic renal failure (HCC)   Chronic obstructive pulmonary disease with acute exacerbation ([place])   Essential hypertension    1.PAF with RVR: -Heart rate is better controlled with the addition of IV amiodarone and metoprolol -Continue IV amiodarone for today, possibly change to PO on 7/12 -Continue renally dose adjusted Xarelto  -Possible DCCV while inpatient if she does not convert to sinus rhythm on her own as her  breathing improves -Plan for dual chamber PPM with AV nodal ablation as an outpatient with EP in a few weeks  2. Acute on chronic diastolic CHF: -Continue IV Lasix 40 mg bid -On metoprolol as above  3. HTN: -Well controlled to slightly soft -Monitor on half-dose amiodarone -Continue current medications   4. CAD without angina: -Continue medical therapy -On Xarelto in place of aspirin   5. COPD: -Per pulmonary  6. Acute on CKD stage III: -Monitor with diuresis -Renal on board   Signed, Marcille Blanco Datil Pager: [phone] 03/07/2016, 7:48 AM

## 2016-03-07 NOTE — Progress Notes (Signed)
PULMONARY / CRITICAL CARE MEDICINE   Name: Regina Burke MRN: [id number] DOB: 1933-05-27    ADMISSION DATE:  03/04/2016 CONSULTATION DATE:  03/04/16  REFERRING MD: EDP  CHIEF COMPLAINT:Acute shortness of breath  HISTORY OF PRESENT ILLNESS:   Regina Burke is a 82 year old female with past medical history significant for COPD on home oxygen, chronic diastolic congestive heart failure, hyperlipidemia, hypertension, sleep apnea, type 2 diabetes, GERD, depression, gout, chronic kidney disease stage III, lung cancer (diagnosed in 2014) and paroxysmal atrial fibrillation. Patient presented to ED via EMS due to respiratory distress which was ongoing for 3 days. She had worsening shortness of breath today. She bumped her oxygen up to 6 L but was satting only up to 84%. EMS placed her on CPAP and her oxygen sats where up to 96%. Patient was more somnolent and therefore EMS gave 2 DuoNeb's. Patient became more awake and was able to nod appropriately to yes or no questions. Patient was placed on BiPAP upon arrival to the [facility] ED. Her chest x-ray was concerning for bilateral lower lobe opacities with WBC of 15.4. Therefore sepsis protocol was initiated and PCCM team was  Called to admit the patient.   SUBJECTIVE: On  bIPAP last night from 11pm-3AM, No acute distress this AM, still in Afib with hr 105 Follow up Plan for cardioversion with cardiology    No current facility-administered medications on file prior to encounter.   Current Outpatient Prescriptions on File Prior to Encounter  Medication Sig  . ALPRAZolam (XANAX) 0.25 MG tablet Take 0.25 mg by mouth 2 (two) times daily as needed for anxiety.  Marland Kitchen amiodarone (PACERONE) 400 MG tablet Take 1 tablet (400 mg total) by mouth 2 (two) times daily.  Marland Kitchen atorvastatin (LIPITOR) 20 MG tablet Take 1 tablet (20 mg total) by mouth every evening.  . cholecalciferol (VITAMIN D) 400 UNITS TABS tablet Take 800 Units by mouth every  evening.   . cyanocobalamin 500 MCG tablet Take 500 mcg by mouth every evening.  . diltiazem (CARDIZEM CD) 240 MG 24 hr capsule Take 1 capsule (240 mg total) by mouth every evening.  . febuxostat (ULORIC) 40 MG tablet Take 40 mg by mouth every evening.   . fenofibrate (TRICOR) 48 MG tablet Take 48 mg by mouth every evening.   . ferrous sulfate 325 (65 FE) MG EC tablet Take 325 mg by mouth every evening.   . fluticasone furoate-vilanterol (BREO ELLIPTA) 100-25 MCG/INH AEPB Inhale 1 puff into the lungs daily.  . furosemide (LASIX) 20 MG tablet Take 20 mg by mouth daily.   . metoprolol succinate (TOPROL XL) 25 MG 24 hr tablet Take 50 mg by mouth 2 (two) times daily.   . nitroGLYCERIN (NITROSTAT) 0.4 MG SL tablet Place 1 tablet (0.4 mg total) under the tongue every 5 (five) minutes as needed for chest pain.  . pantoprazole (PROTONIX) 40 MG tablet Take 40 mg by mouth every evening.   . Rivaroxaban (XARELTO) 15 MG TABS tablet Take 1 tablet (15 mg total) by mouth daily with supper.  . sertraline (ZOLOFT) 50 MG tablet Take 50 mg by mouth every evening.   . tiotropium (SPIRIVA) 18 MCG inhalation capsule Place 18 mcg into inhaler and inhale daily.   REVIEW OF SYSTEMS:    Review of Systems:  Gen:  Denies  fever, sweats, chills weigh loss   HEENT: Denies blurred vision, double vision, ear pain, eye pain, hearing loss, nose bleeds, sore throat  Cardiac:  No dizziness, chest pain or heaviness, chest tightness,edema  Resp:   Denies cough or sputum porduction, shortness of breath,wheezing, hemoptysis,   Gi: Denies swallowing difficulty, stomach pain, nausea or vomiting, diarrhea, constipation, bowel incontinence  Gu:  Denies bladder incontinence, burning urine  Ext:   Denies Joint pain, stiffness or swelling  Skin: Denies  skin rash, easy bruising or bleeding or hives  Endoc:  Denies polyuria, polydipsia , polyphagia or weight change  Psych:   Denies depression, insomnia or hallucinations    Other:  All other systems negative     VITAL SIGNS: BP 104/52 mmHg  Pulse 105  Temp(Src) 98.7 F (37.1 C) (Oral)  Resp 22  Ht '5\' 5"'$  (1.651 m)  Wt 190 lb 0.6 oz (86.2 kg)  BMI 31.62 kg/m2  SpO2 94%      INTAKE / OUTPUT: I/O last 3 completed shifts: In: 1236.7 [P.O.:320; I.V.:766.7; IV Piggyback:150] Out: 2100 [Urine:2100]  PHYSICAL EXAMINATION: General:Elderly white female found on BiPAP  Neuro:   follows commands  HEENT: Atraumatic, normocephalic, no discharge noted  Cardiovascular:  Irregular, S1 and S2, no murmur rub or gallop noted  Lungs:  Tachypneic, coarse, faint wheezes throughout Abdomen:  Soft, nontender, active bowel sounds Musculoskeletal:  No inflammation/deformity noted Skin:  Grossly intact  LABS:  BMET  Recent Labs Lab 03/05/16 0505 03/06/16 0433 03/07/16 0447  NA 139 138 137  K 4.1 4.7 4.3  CL 103 100* 96*  CO2 '27 27 30  '$ BUN 86* 92* 96*  CREATININE 2.86* 3.01* 3.08*  GLUCOSE 150* 124* 115*    Electrolytes  Recent Labs Lab 03/05/16 0505 03/06/16 0433 03/07/16 0447  CALCIUM 8.1* 8.6* 8.7*  MG 2.4  --   --   PHOS 5.3*  --  6.1*    CBC  Recent Labs Lab 03/04/16 2100 03/05/16 0505 03/07/16 0447  WBC 15.4* 15.5* 8.5  HGB 11.1* 10.9* 9.5*  HCT 33.6* 32.6* 28.2*  PLT 482* 446* 245    Coag's  Recent Labs Lab 03/05/16 0722  INR 1.51    Sepsis Markers  Recent Labs Lab 03/04/16 2100 03/04/16 2140 03/05/16 0040 03/05/16 0505 03/06/16 0433  LATICACIDVEN  --  0.9 1.0  --   --   PROCALCITON <0.10  --   --  0.26 0.63    ABG  Recent Labs Lab 03/04/16 2111 03/05/16 0454  PHART 7.23* 7.30*  PCO2ART 80* 63*  PO2ART 83 111*    Liver Enzymes  Recent Labs Lab 03/07/16 0447  ALBUMIN 3.6    Cardiac Enzymes  Recent Labs Lab 03/04/16 2100  TROPONINI <0.03    Glucose  Recent Labs Lab 03/06/16 1156 03/06/16 1635 03/06/16 1949 03/06/16 2352 03/07/16 0423 03/07/16 0719  GLUCAP 171* 139* 177*  127* 118* 131*    Imaging No results found.   STUDIES:  none  CULTURES: 7/8 blood cultures >> 7/18 urine culture >> 7/8 sputum culture >>  ANTIBIOTICS: 7/8 vancomycin>>7/10 7/8 azactam>>7/10    LINES/TUBES: None  DISCUSSION: 82 year old female with multiple comorbidities such as COPD, CHF, chronic kidney disease, hypertension, sleep apnea now presenting with bilateral lower lobe opacities likely related to acute CHF with uncontrolled Afib   ASSESSMENT / PLAN:  PULMONARY A:  Acute on chronic respiratory failure History of sleep apnea  Hx of right upper lobe cancer  P:   Continue BiPAP as needed abg and cxr as needed Will d/c Vancomycin/Azactam Continue Bronchodilators Wean steroids  CARDIOVASCULAR A:  Shock possible cariogenic Check LA Hypotension History of  hypertension Atrial fibrillation History of Hyperlipidemia History of chronic diastolic CHF  P:  Keep MAP goals>65 Follow up cardiology recs for cardioversion. -continue amiodarone  RENAL A:   Acute on chronic kidney disease P:   Replace electrolytes per ICU protocol Follow chemistry   GASTROINTESTINAL A:   History of GERD P:  Nothing by mouth for now  Pepcid   HEMATOLOGIC A:   No active issues P:  Transfuse if Hgb< 7 lovenox for DVT prophylaxis  INFECTIOUS D/c abx P:  I/v fluids Follow cultures MAP Goals>65 Pro-calcitonin<0.10, lactic acid-0.9  ENDOCRINE A:   Diabetes Melitus type-2 P:   Blood sugar checks q4 hours ssi coverage  NEUROLOGIC A:   Hx of depression P:   RASS goal:0 Restart zoloft    The Patient requires high complexity decision making for assessment and support, frequent evaluation and titration of therapies, application of advanced monitoring technologies and extensive interpretation of multiple databases.   Overall, prognosis is guarded. Recommend DNR/DNI status,  palliative care consulted.  Corrin Parker, M.D.  [facility]  Medical Director [place] Director [facility] Cardio-Pulmonary Department

## 2016-03-07 NOTE — Progress Notes (Signed)
[facility]  ROUNDING NOTE   Subjective:   Breathing better. Off bipap this morning   UOP 1425 (1375 ) Creatinine 3.01 (2.86)  Cardioversion postponed.   Objective:  Vital signs in last 24 hours:  Temp:  [98.2 F (36.8 C)-98.7 F (37.1 C)] 98.7 F (37.1 C) (07/11 0800) Pulse Rate:  [93-121] 105 (07/11 0800) Resp:  [13-26] 22 (07/11 0800) BP: (92-120)/(52-92) 104/52 mmHg (07/11 0800) SpO2:  [91 %-99 %] 94 % (07/11 0800) Weight:  [86.2 kg (190 lb 0.6 oz)] 86.2 kg (190 lb 0.6 oz) (07/11 0519)  Weight change: 0.5 kg (1 lb 1.6 oz) Filed Weights   03/05/16 0458 03/06/16 0500 03/07/16 0519  Weight: 88.1 kg (194 lb 3.6 oz) 85.7 kg (188 lb 15 oz) 86.2 kg (190 lb 0.6 oz)    Intake/Output: I/O last 3 completed shifts: In: 1236.7 [P.O.:320; I.V.:766.7; IV Piggyback:150] Out: 2100 [Urine:2100]   Intake/Output this shift:  Total I/O In: 26.7 [I.V.:26.7] Out: -   Physical Exam: General:  laying in bed  Head: [place]/AT  Eyes: Anicteric, PERRL  Neck: Supple, trachea midline  Lungs:  Bilateral basilar crackles  Heart: Tachycardia, irregular  Abdomen:  Soft, nontender,   Extremities:  no peripheral edema.  Neurologic: Nonfocal, moving all four extremities  Skin: No lesions       Basic Metabolic Panel:  Recent Labs Lab 03/04/16 2100 03/05/16 0505 03/06/16 0433 03/07/16 0447  NA 136 139 138 137  K 4.3 4.1 4.7 4.3  CL 96* 103 100* 96*  CO2 '30 27 27 30  '$ GLUCOSE 273* 150* 124* 115*  BUN 86* 86* 92* 96*  CREATININE 2.98* 2.86* 3.01* 3.08*  CALCIUM 8.8* 8.1* 8.6* 8.7*  MG  --  2.4  --   --   PHOS  --  5.3*  --  6.1*    Liver Function Tests:  Recent Labs Lab 03/07/16 0447  ALBUMIN 3.6   No results for input(s): LIPASE, AMYLASE in the last 168 hours. No results for input(s): AMMONIA in the last 168 hours.  CBC:  Recent Labs Lab 03/04/16 2100 03/05/16 0505 03/07/16 0447  WBC 15.4* 15.5* 8.5  NEUTROABS 12.1*  --   --   HGB 11.1* 10.9* 9.5*   HCT 33.6* 32.6* 28.2*  MCV 80.7 80.9 80.5  PLT 482* 446* 245    Cardiac Enzymes:  Recent Labs Lab 03/04/16 2100  TROPONINI <0.03    BNP: Invalid input(s): POCBNP  CBG:  Recent Labs Lab 03/06/16 1635 03/06/16 1949 03/06/16 2352 03/07/16 0423 03/07/16 0719  GLUCAP 139* 177* 127* 118* 131*    Microbiology: Results for orders placed or performed during the hospital encounter of 03/04/16  Blood Culture (routine x 2)     Status: None (Preliminary result)   Collection Time: 03/04/16  9:40 PM  Result Value Ref Range Status   Specimen Description BLOOD LEFT HAND  Final   Special Requests   Final    BOTTLES DRAWN AEROBIC AND ANAEROBIC 11CCAERO,12CCANA   Culture NO GROWTH 2 DAYS  Final   Report Status PENDING  Incomplete  Blood Culture (routine x 2)     Status: None (Preliminary result)   Collection Time: 03/04/16  9:40 PM  Result Value Ref Range Status   Specimen Description BLOOD RIGHT HAND  Final   Special Requests   Final    BOTTLES DRAWN AEROBIC AND ANAEROBIC [place]   Culture NO GROWTH 2 DAYS  Final   Report Status PENDING  Incomplete  MRSA PCR  Screening     Status: None   Collection Time: 03/05/16 12:30 AM  Result Value Ref Range Status   MRSA by PCR NEGATIVE NEGATIVE Final    Comment:        The GeneXpert MRSA Assay (FDA approved for NASAL specimens only), is one component of a comprehensive MRSA colonization surveillance program. It is not intended to diagnose MRSA infection nor to guide or monitor treatment for MRSA infections.   Urine culture     Status: None   Collection Time: 03/05/16 10:33 AM  Result Value Ref Range Status   Specimen Description URINE, RANDOM  Final   Special Requests NONE  Final   Culture NO GROWTH Performed at [facility]   Final   Report Status 03/06/2016 FINAL  Final    Coagulation Studies:  Recent Labs  03/05/16 0722  LABPROT 18.3*  INR 1.51    Urinalysis:  Recent Labs  03/05/16 1033   COLORURINE YELLOW*  LABSPEC 1.014  PHURINE 5.0  GLUCOSEU NEGATIVE  HGBUR NEGATIVE  BILIRUBINUR NEGATIVE  KETONESUR NEGATIVE  PROTEINUR 30*  NITRITE NEGATIVE  LEUKOCYTESUR NEGATIVE      Imaging: No results found.   Medications:   . sodium chloride    . sodium chloride 25 mL/hr at 03/06/16 0700  . amiodarone 30 mg/hr (03/06/16 2312)   . antiseptic oral rinse  7 mL Mouth Rinse q12n4p  . chlorhexidine  15 mL Mouth Rinse BID  . famotidine (PEPCID) IV  20 mg Intravenous QHS  . fluticasone furoate-vilanterol  1 puff Inhalation Daily  . furosemide  20 mg Intravenous Q12H  . insulin aspart  0-24 Units Subcutaneous Q4H  . methylPREDNISolone (SOLU-MEDROL) injection  40 mg Intravenous Q24H  . metoprolol tartrate  25 mg Oral Q6H  . ranolazine  500 mg Oral BID  . rivaroxaban  15 mg Oral Q supper  . sodium chloride flush  3 mL Intravenous Q12H  . tiotropium  18 mcg Inhalation Daily   sodium chloride, diltiazem, ondansetron (ZOFRAN) IV, sodium chloride flush  Assessment/ Plan:  [name] is a 82 y.o. white female with diabetes mellitus type II, coronary artery disease, atrial fibrillation, hypertension, hyperlipidemia, generalized anxiety disorder, gout, anemia, history of lung cancer, COPD, atrial fibrillation, fatty liver disease, GERD, osteoporosis   1. Acute Renal Failure on Chronic kidney disease stage III with history of hyperkalemia and proteinuria: Baseline creatinine of 1.5, eGFR of 32.  Currently off ACE-I/ARB  due to hyperkalemia.   Chronic Kidney disease secondary to diabetes and hypertension.  - Monitor potassium level. No acute indication for dialysis. Continue to monitor urine output and volume status.  - Continue fursoemide. Nonoliguric urine output.   2. Hypertension, atrial fibrillation with rapid ventricular response. Placed on amiodarone gtt.  - Continue diltiazem and metoprolol for rate control.  - Decrease furosemide '20mg'$  IV q12  3. Anemia with  chronic kidney disease: Hemoglobin stable.    LOS: [place], [place] 7/11/20179:08 AM

## 2016-03-07 NOTE — Progress Notes (Signed)
1800  Cardioversion again  delayed until 7/12. CT of lungs show slight improvement. Output better today with less Lasix .Explained to patient that urine output and kidney filtration were different ie her kidney lab values were not good even if she was urinating. Family in and out. Used BiPAP once today. IV dressing changed x 2 on right and once on left. Remains on Amniodorone at '30mg'$   and  in A. Fib rate of 100s to 110s.

## 2016-03-08 ENCOUNTER — Other Ambulatory Visit: Payer: Self-pay

## 2016-03-08 ENCOUNTER — Encounter: Payer: Self-pay | Admitting: Anesthesiology

## 2016-03-08 ENCOUNTER — Encounter
Admission: EM | Disposition: A | Discharge status: Home / Self Care | Payer: Self-pay | Source: Home / Self Care | Attending: Internal Medicine

## 2016-03-08 ENCOUNTER — Inpatient Hospital Stay: Payer: Medicare Other | Admitting: Anesthesiology

## 2016-03-08 DIAGNOSIS — Z7189 Other specified counseling: Secondary | ICD-10-CM

## 2016-03-08 DIAGNOSIS — Z515 Encounter for palliative care: Secondary | ICD-10-CM

## 2016-03-08 HISTORY — PX: ELECTROPHYSIOLOGIC STUDY: SHX172A

## 2016-03-08 LAB — GLUCOSE, CAPILLARY
Glucose-Capillary: 119 mg/dL — ABNORMAL HIGH (ref 65–99)
Glucose-Capillary: 136 mg/dL — ABNORMAL HIGH (ref 65–99)
Glucose-Capillary: 140 mg/dL — ABNORMAL HIGH (ref 65–99)
Glucose-Capillary: 151 mg/dL — ABNORMAL HIGH (ref 65–99)
Glucose-Capillary: 173 mg/dL — ABNORMAL HIGH (ref 65–99)
Glucose-Capillary: 211 mg/dL — ABNORMAL HIGH (ref 65–99)

## 2016-03-08 LAB — BASIC METABOLIC PANEL
ANION GAP: 12 (ref 5–15)
BUN: 108 mg/dL — AB (ref 6–20)
CALCIUM: 9 mg/dL (ref 8.9–10.3)
CO2: 31 mmol/L (ref 22–32)
Chloride: 96 mmol/L — ABNORMAL LOW (ref 101–111)
Creatinine, Ser: 2.75 mg/dL — ABNORMAL HIGH (ref 0.44–1.00)
GFR calc Af Amer: 18 mL/min — ABNORMAL LOW (ref >60)
GFR, EST NON AFRICAN AMERICAN: 15 mL/min — AB (ref >60)
GLUCOSE: 108 mg/dL — AB (ref 65–99)
POTASSIUM: 4.1 mmol/L (ref 3.5–5.1)
Sodium: 139 mmol/L (ref 135–145)

## 2016-03-08 SURGERY — CARDIOVERSION (CATH LAB)
Anesthesia: General

## 2016-03-08 MED ORDER — ONDANSETRON HCL 4 MG/2ML IJ SOLN
INTRAMUSCULAR | Status: AC
  Filled 2016-03-08: qty 2

## 2016-03-08 MED ORDER — LEVALBUTEROL HCL 0.63 MG/3ML IN NEBU
0.6300 mg | INHALATION_SOLUTION | Freq: Once | RESPIRATORY_TRACT | Status: DC
  Filled 2016-03-08: qty 3

## 2016-03-08 MED ORDER — FUROSEMIDE 10 MG/ML IJ SOLN
INTRAMUSCULAR | Status: AC
  Filled 2016-03-08: qty 2

## 2016-03-08 MED ORDER — AMIODARONE HCL 200 MG PO TABS: 200.0000 mg | ORAL_TABLET | Freq: Every day | ORAL | Status: DC

## 2016-03-08 MED ORDER — METOPROLOL TARTRATE 25 MG PO TABS
25.0000 mg | ORAL_TABLET | Freq: Four times a day (QID) | ORAL | Status: DC
  Administered 2016-03-08 – 2016-03-10 (×8): 25 mg via ORAL
  Filled 2016-03-08 (×8): qty 1

## 2016-03-08 MED ORDER — METHYLPREDNISOLONE SODIUM SUCC 40 MG IJ SOLR
40.0000 mg | Freq: Once | INTRAMUSCULAR | Status: AC
  Administered 2016-03-08: 40 mg via INTRAVENOUS

## 2016-03-08 MED ORDER — PROPOFOL 10 MG/ML IV BOLUS
INTRAVENOUS | Status: DC | PRN
  Administered 2016-03-08 (×3): 10 mg via INTRAVENOUS
  Administered 2016-03-08 (×2): 20 mg via INTRAVENOUS

## 2016-03-08 MED ORDER — METOPROLOL TARTRATE 25 MG PO TABS: 25.0000 mg | ORAL_TABLET | Freq: Two times a day (BID) | ORAL | Status: DC

## 2016-03-08 MED ORDER — SENNOSIDES-DOCUSATE SODIUM 8.6-50 MG PO TABS
2.0000 | ORAL_TABLET | Freq: Two times a day (BID) | ORAL | Status: DC
  Administered 2016-03-08 – 2016-03-09 (×2): 2 via ORAL
  Filled 2016-03-08 (×4): qty 2

## 2016-03-08 MED ORDER — METHYLPREDNISOLONE SODIUM SUCC 125 MG IJ SOLR
INTRAMUSCULAR | Status: AC
  Filled 2016-03-08: qty 2

## 2016-03-08 MED ORDER — METOPROLOL TARTRATE 5 MG/5ML IV SOLN
5.0000 mg | INTRAVENOUS | Status: DC | PRN
  Administered 2016-03-08 (×3): 5 mg via INTRAVENOUS
  Filled 2016-03-08 (×3): qty 5

## 2016-03-08 MED ORDER — DILTIAZEM HCL ER COATED BEADS 120 MG PO CP24
240.0000 mg | ORAL_CAPSULE | Freq: Every day | ORAL | Status: DC
  Administered 2016-03-08 – 2016-03-14 (×7): 240 mg via ORAL
  Filled 2016-03-08 (×4): qty 1
  Filled 2016-03-08: qty 2
  Filled 2016-03-08: qty 1
  Filled 2016-03-08: qty 2

## 2016-03-08 NOTE — Progress Notes (Signed)
Notified Dr. Farrel Conners about patient's HR converted back to AFIB- anywhere from 108 to low 120's.  Patient still on bipap- she was not received her AM dose PO of metoprolol due to the bipap.  Dr. Farrel Conners ordered for metoprolol IV '5mg'$  q4 PRN for HR more than 100.

## 2016-03-08 NOTE — Anesthesia Preprocedure Evaluation (Addendum)
Anesthesia Evaluation  Patient identified by MRN, date of birth, ID band Patient awake    Reviewed: Allergy & Precautions, H&P , NPO status , Patient's Chart, lab work & pertinent test results  History of Anesthesia Complications Negative for: history of anesthetic complications  Airway Mallampati: III  TM Distance: >3 FB Neck ROM: limited    Dental  (+) Poor Dentition   Pulmonary shortness of breath and with exertion, asthma , sleep apnea and Continuous Positive Airway Pressure Ventilation , pneumonia, resolved, COPD,  COPD inhaler and oxygen dependent, former smoker,   Mild end expiratory wheezes Pulmonary exam normal  + wheezing      Cardiovascular Exercise Tolerance: Poor hypertension, Pt. on medications (-) angina+ CAD, + Past MI, + Cardiac Stents, +CHF and + DOE  Normal cardiovascular exam+ dysrhythmias Atrial Fibrillation + Valvular Problems/Murmurs  Rhythm:regular Rate:Normal     Neuro/Psych  Headaches, PSYCHIATRIC DISORDERS Depression    GI/Hepatic Neg liver ROS, hiatal hernia, GERD  Medicated,  Endo/Other  diabetes, Well Controlled, Type 2, Oral Hypoglycemic Agents  Renal/GU Renal InsufficiencyRenal disease  negative genitourinary   Musculoskeletal  (+) Arthritis , Osteoarthritis,    Abdominal   Peds negative pediatric ROS (+)  Hematology negative hematology ROS (+) anemia ,   Anesthesia Other Findings Past Medical History:   COPD (chronic obstructive pulmonary disease) (*              CHF (congestive heart failure) (HCC)                         Hypertension                                                 Rapid palpitations                              2014           Comment:Seen at [facility], may have been atrial fibrillation   DOE (dyspnea on exertion)                                      Comment:Started after treatment for lung cancer   High cholesterol                                             Heart  murmur                                                 Myocardial infarction ([place])                     12/22/2014    Sleep apnea                                                  Cushing's disease (  [place])                                      Type II diabetes mellitus ([place])                              Anemia                                                       GERD (gastroesophageal reflux disease)                       History of hiatal hernia                                     Migraine                                                     Arthritis                                                    Gout                                                         Depression                                                   Asthma                                                       Dysrhythmia                                                  Coronary artery disease                                      HOH (hard of hearing)                                        Chronic kidney disease (CKD), stage III (  [place]*                Comment:INCREASED KT   Anginal pain (HCC)                                           Cough                                                          Comment:CHRONIC AT NIGHT   Edema                                                          Comment:FEET/LEGS   Wheezing                                                     Pneumonia                                                    Lung cancer ([place])                               dx'd 2014      Comment:S/P radiation 2015   Diverticulitis                                               Multiple allergies                                           Atrial fibrillation ([place])                                   Past Surgical History:   ADRENALECTOMY                                   Left 1980's         Comment:"[place]"   ABDOMINAL HYSTERECTOMY                                        APPENDECTOMY  CHOLECYSTECTOMY                                               CORONARY ANGIOPLASTY WITH STENT PLACEMENT        12/23/2014    TONSILLECTOMY                                                 FRACTURE SURGERY                                              WRIST FRACTURE SURGERY                          Bilateral ~ 2000       BREAST CYST EXCISION                            Left              TUBAL LIGATION                                                LEFT HEART CATHETERIZATION WITH CORONARY ANGIO* N/A 12/23/2014      Comment:Procedure: LEFT HEART CATHETERIZATION WITH               CORONARY ANGIOGRAM;  Surgeon: Burnell Blanks, MD;  Location: [facility] CATH LAB;  Service:              Cardiovascular;  Laterality: N/A;   PERCUTANEOUS CORONARY STENT INTERVENTION (PCI-*  12/23/2014      Comment:Procedure: PERCUTANEOUS CORONARY STENT               INTERVENTION (PCI-S);  Surgeon: Burnell Blanks, MD;  Location: [facility] CATH LAB;  Service:              Cardiovascular;;  Promus 2.25x8  BMI    Body Mass Index   29.16 kg/m 2    Patient has cardiac clearance for this procedure.    Reproductive/Obstetrics negative OB ROS                         Anesthesia Physical  Anesthesia Plan  ASA: IV  Anesthesia Plan: General   Post-op Pain Management:    Induction: Intravenous  Airway Management Planned: Nasal Cannula  Additional Equipment:   Intra-op Plan:   Post-operative Plan:   Informed Consent: I have reviewed the patients History and Physical, chart, labs and discussed the procedure including the risks, benefits and alternatives for the proposed anesthesia with the patient or authorized representative who has indicated his/her understanding and acceptance.   Dental Advisory Given  Plan Discussed with: Anesthesiologist, CRNA and Surgeon  Anesthesia Plan Comments: (High risk patient.  We will induce with slow titration of propofol  to the sedative edge.  Preop Resp status shows 92-93% Sats on   3-4 L/M with some accessory muscle use.  )      Anesthesia Quick Evaluation

## 2016-03-08 NOTE — CV Procedure (Signed)
Procedure:   DCCV  Indication:  Symptomatic atrial fib.  Procedure Note:  The nature of the procedure, benefits and risks were discussed with the patient. Patient verbalized understanding and wished to proceed. The patient signed informed consent.  Se has had had therapeutic anticoagulation with Eliquis switched to Xarelto greater than 3 weeks.  '70mg'$  IV propofol was administered by Anesthesia/CRNA.  Adequate airway was maintained throughout and vital followed per protocol.  He was cardioverted x 1 with 200J of biphasic synchronized energy.  She converted to NSR.  There were no apparent complications.  The patient had normal neuro status and respiratory status post procedure with vitals stable as recorded elsewhere.

## 2016-03-08 NOTE — Progress Notes (Addendum)
Patient Name: Regina Burke Date of Encounter: 03/08/2016  Patient Care Team: Tracie Harrier, MD as PCP - General (Internal Medicine) Minna Merritts, MD as Consulting Physician (Cardiology)  PROBLEM LIST  Active Problems:   Acute respiratory failure [facility])   Coronary artery disease involving native coronary artery of native heart without angina pectoris   Paroxysmal atrial fibrillation (HCC)   Acute on chronic renal failure (HCC)   Chronic obstructive pulmonary disease with acute exacerbation ([place])   Essential hypertension   Acute on chronic diastolic CHF (congestive heart failure), NYHA class 1 ([place])   Persistent atrial fibrillation ([place])     SUBJECTIVE  Pt was very symptomatic while in afib. HR was difficult to control even while on Amio gtt, still > 100bpm. No CP.  She is s/p CV today. Tolerated procedure well. Now having mild issues with wheezing. She is refusing to try Xopenex due to concern about feeling jittery.    CURRENT MEDS . ALPRAZolam  0.25 mg Oral QHS  . [START ON 03/09/2016] amiodarone  200 mg Oral Daily  . antiseptic oral rinse  7 mL Mouth Rinse q12n4p  . chlorhexidine  15 mL Mouth Rinse BID  . famotidine  20 mg Oral QHS  . fluticasone furoate-vilanterol  1 puff Inhalation Daily  . furosemide  20 mg Intravenous Q12H  . insulin aspart  0-24 Units Subcutaneous Q4H  . levalbuterol  0.63 mg Nebulization Once  . methylPREDNISolone (SOLU-MEDROL) injection  40 mg Intravenous Q24H  . metoprolol tartrate  25 mg Oral BID  . ranolazine  500 mg Oral BID  . rivaroxaban  15 mg Oral Q supper  . sertraline  50 mg Oral Daily  . sodium chloride flush  3 mL Intravenous Q12H  . sodium chloride flush  3 mL Intravenous Q12H  . tiotropium  18 mcg Inhalation Daily    OBJECTIVE  Filed Vitals:   03/08/16 0758 03/08/16 0759 03/08/16 0800 03/08/16 0807  BP:    146/74  Pulse: 108 105 112 68  Temp:      TempSrc:      Resp: '18 17 17 22  '$ Height:      Weight:       SpO2: 93% 96% 96% 95%    Intake/Output Summary (Last 24 hours) at 03/08/16 0821 Last data filed at 03/08/16 0804  Gross per 24 hour  Intake 1347.3 ml  Output   1900 ml  Net -552.7 ml   Filed Weights   03/06/16 0500 03/07/16 0519 03/08/16 0600  Weight: 188 lb 15 oz (85.7 kg) 190 lb 0.6 oz (86.2 kg) 193 lb 5.5 oz (87.7 kg)    PHYSICAL EXAM VS:  BP 146/74 mmHg  Pulse 68  Temp(Src) 97.8 F (36.6 C) (Oral)  Resp 22  Ht '5\' 5"'$  (1.651 m)  Wt 193 lb 5.5 oz (87.7 kg)  BMI 32.17 kg/m2  SpO2 95% , BMI Body mass index is 32.17 kg/(m^2). GENERAL:  well developed, well nourished, obese, not in acute distress HEENT: normocephalic, pink conjunctivae, anicteric sclerae, no xanthelasma, normal dentition, oropharynx clear NECK:  no neck vein engorgement, JVP normal, no hepatojugular reflux, carotid upstroke brisk and symmetric, no bruit, no thyromegaly, no lymphadenopathy LUNGS:  good respiratory effort, occasional inspiratory wheezing CV:  PMI not displaced, no thrills, no lifts, S1 and S2 within normal limits, no palpable S3 or S4, no murmurs, no rubs, no gallops ABD:  Soft, nontender, nondistended, normoactive bowel sounds, no abdominal aortic bruit, no hepatomegaly, no splenomegaly MS:  nontender back, no kyphosis, no scoliosis, no joint deformities EXT:  2+ DP/PT pulses, no edema, no varicosities, no cyanosis, no clubbing SKIN: warm, nondiaphoretic, normal turgor, no ulcers NEUROPSYCH: alert, oriented to person, place, and time, sensory/motor grossly intact, normal mood, appropriate affect   Accessory Clinical Findings  CBC  Recent Labs  03/07/16 0447  WBC 8.5  HGB 9.5*  HCT 28.2*  MCV 80.5  PLT 161   Basic Metabolic Panel  Recent Labs  03/07/16 0447 03/08/16 0335  NA 137 139  K 4.3 4.1  CL 96* 96*  CO2 30 31  GLUCOSE 115* 108*  BUN 96* 108*  CREATININE 3.08* 2.75*  CALCIUM 8.7* 9.0  PHOS 6.1*  --    Liver Function Tests  Recent Labs  03/07/16 0447  ALBUMIN  3.6   No results for input(s): LIPASE, AMYLASE in the last 72 hours. Cardiac Enzymes No results for input(s): CKTOTAL, CKMB, CKMBINDEX, TROPONINI in the last 72 hours. BNP (last 3 results)  Recent Labs  01/21/16 1228 03/04/16 2100 03/05/16 0505  BNP 943.0* 1114.0* 1124.0*   D-Dimer No results for input(s): DDIMER in the last 72 hours. Hemoglobin A1C No results for input(s): HGBA1C in the last 72 hours. Fasting Lipid Panel No results for input(s): CHOL, HDL, LDLCALC, TRIG, CHOLHDL, LDLDIRECT in the last 72 hours. Thyroid Function Tests No results for input(s): TSH, T4TOTAL, T3FREE, THYROIDAB in the last 72 hours.  Invalid input(s): FREET3  TELE SR currently, HR 70s  ECG   RADIOLOGY/STUDIES  Dg Chest 1 View  03/04/2016  CLINICAL DATA:  Respiratory distress EXAM: CHEST 1 VIEW COMPARISON:  Chest radiograph dated 01/24/2016. CT chest dated 01/21/2016. FINDINGS: Patchy bilateral lower lobe opacities, suspicious for pneumonia, less likely atelectasis. Superimposed mild interstitial edema is suspected. Small bilateral pleural effusions. No pneumothorax. Again noted is a stable focal opacity in the medial right upper lobe, most of which likely reflects radiation changes when correlating with prior CT, underlying residual/recurrent tumor not excluded. Cardiomegaly. IMPRESSION: Patchy bilateral lower lobe opacities, suspicious for pneumonia, less likely atelectasis. Superimposed mild interstitial edema is suspected. Small bilateral pleural effusions. Stable focal opacity in the medial right upper lobe, most of which likely reflects radiation changes when correlating with prior CT, underlying residual/recurrent tumor not excluded. Electronically Signed   By: Julian Hy M.D.   On: 03/04/2016 21:22   Ct Chest Wo Contrast  03/07/2016  CLINICAL DATA:  History of COPD with respiratory distress for the past 3 days EXAM: CT CHEST WITHOUT CONTRAST TECHNIQUE: Multidetector CT imaging of the  chest was performed following the standard protocol without IV contrast. COMPARISON:  03/04/2016, 01/21/2016 FINDINGS: Cardiovascular: Calcifications of the thoracic aorta are noted without aneurysmal dilatation. The cardiac structures show coronary calcifications. Mediastinum/Nodes: The thoracic inlet demonstrates some nodularity within the right lobe of the thyroid. This is stable from the prior exam. No significant hilar or mediastinal adenopathy is noted. Lungs/Pleura: Bilateral pleural effusions are seen left slightly greater than right. Some associated lower lobe consolidation is noted again left greater than right. These changes are relatively stable from the prior exam. The ground-glass density seen in the left upper lobe is again identified and has improved somewhat in the interval from the prior exam. Scarring in the right upper lobe is again noted stable from the prior exam as well. No new focal infiltrate is seen. Upper Abdomen: Stable cystic lesions are again seen in the spleen. Right adrenal lesion is again noted and stable. Musculoskeletal: No acute bony abnormality  noted. IMPRESSION: Bilateral pleural effusions and associated lower lobe consolidation stable from the prior study. Stable changes in the right upper lobe similar to that seen on the recent exam. Slight improvement in the ground-glass density within the left upper lobe. Electronically Signed   By: Inez Catalina M.D.   On: 03/07/2016 16:36    ASSESSMENT AND PLAN 1.Persistent atrial fibrillation, RVR now in SR Now s/p CV x 1 with 200J -Continue IV amiodarone for today, change to '200mg'$  PO tomorrow -Continue renally dose adjusted Xarelto  -Plan for ffup with EP as outpatient  2. CHF, diastolic dysfunction, acute on chronic Improved - Continue Lasix '20mg'$  IV q12 for now, consider switch to PO tomorrow -Change metoprolol back to her home dose of '25mg'$  bid  3. HTN: -Well controlled -Continue current medications   4. CAD without  angina: -Continue medical therapy -On Xarelto in place of aspirin   5. COPD: Pt given her doses of Breo and Spiriva. Does not want to try Xopenex O2 sats > 92 -Per pulmonary.   6. Acute on CKD stage III: -Monitor with diuresis -Renal on board  I spent at least 40 minutes with the patient today and more than 50% of the time was spent counseling the patient and coordinating care.   Signed, Wende Bushy, MD  03/08/2016, 8:21 AM  [place]

## 2016-03-08 NOTE — Consult Note (Signed)
Consultation Note Date: 03/08/2016   Patient Name: Regina Burke  DOB: 05/03/1933  MRN: [id number]  Age / Sex: 82 y.o., female  PCP: Tracie Harrier, MD Referring Physician: Flora Lipps, MD  Reason for Consultation: Establishing goals of care and Psychosocial/spiritual support  HPI/Patient Profile: 82 y.o. female  admitted on 03/04/2016  past medical history significant for COPD on home oxygen, chronic diastolic congestive heart failure, hyperlipidemia, hypertension, sleep apnea, type 2 diabetes, GERD, depression, gout, chronic kidney disease stage III, lung cancer (diagnosed in 2014) and paroxysmal atrial fibrillation.   Patient admitted thru  ED via EMS due to respiratory distress, frequent hospitalizations.    She had worsening shortness of breath. Work-up in ED showed  chest x-ray concerning for bilateral lower lobe opacities with WBC of 15.4, sepsis protocol was initiated.  She has required BiPap intermittently     Cardioversion today and unfortunately  patient  converted back to afib.  Patient and her family report continued physical and functional decline over the past year, " I know my body is giving out"  Faced with advanced care decisions and anticipatory care need  Clinical Assessment and Goals of Care:   This NP Wadie Lessen reviewed medical records, received report from team, assessed the patient and then meet at the patient's bedside along with family to include two sons (son [name] is HPOA), daughter and brother and sister  to discuss diagnosis, prognosis, GOC, EOL wishes disposition and options.   A detailed discussion was had today regarding advanced directives.  Concepts specific to code status, artifical feeding and hydration, continued IV antibiotics and rehospitalization was had.  The difference between a aggressive medical intervention path  and a palliative comfort care path for this  patient at this time was had.  Values and goals of care important to patient and family were attempted to be elicited.  Concept of Hospice and Palliative Care were discussed  Natural trajectory and expectations at EOL were discussed.  Questions and concerns addressed.  Hard Choices booklet left for review. Family encouraged to call with questions or concerns.  PMT will continue to support holistically.  MOST form introduced      SUMMARY OF RECOMMENDATIONS    Code Status/Advance Care Planning:  Limited code- documented today, NO CPR or cardioversion.  If pateint requires intubation she is in agreement  "for a short time"  but HPOA knows her wishes for no prolonged intubation    Palliative Prophylaxis:   Aspiration, Bowel Regimen, Frequent Pain Assessment and Oral Care  Additional Recommendations (Limitations, Scope, Preferences):    At this time patient is open to all offered and available medical interventions to prolong life, she is hopeful for improvement.   "I feel like I'm going to get better and go home again"   For now one day at at time, patient is comfortable talking about the what ifs and decisions she will face if she does not improve medically  She anticipates recommendation for ablation and pacemaker as discussed with her cardiologist  Psycho-social/Spiritual:   Desire for further Chaplaincy support:yes  Additional Recommendations: Education on Hospice  Prognosis:   < 6 months  Discharge Planning: To Be Determined      Primary Diagnoses: Present on Admission:  . Acute respiratory failure ([place]) . Paroxysmal atrial fibrillation (HCC) . Acute on chronic diastolic CHF (congestive heart failure), NYHA class 1 ([place]) . Coronary artery disease involving native coronary artery of native heart without angina pectoris . Chronic obstructive pulmonary disease with acute exacerbation ([place]) . Acute on chronic renal failure ([place]) . Essential hypertension  I have  reviewed the medical record, interviewed the patient and family, and examined the patient. The following aspects are pertinent.  Past Medical History  Diagnosis Date  . COPD (chronic obstructive pulmonary disease) ([place])   . Chronic diastolic CHF (congestive heart failure) ([place])     a. 10/2015 Echo: EF 55-65%, Gr1 DD, mild MR, mildly dil LA, nl RV fxn, nl PASP.  Marland Kitchen Hypertensive heart disease   . HLD (hyperlipidemia)   . Sleep apnea   . Cushing's disease ([place])   . Type II diabetes mellitus ([place])   . Anemia   . GERD (gastroesophageal reflux disease)   . History of hiatal hernia   . Migraine   . Arthritis   . Gout   . Depression   . Asthma   . Coronary artery disease     a. 11/2014 NSTEMI/PCI: LM nl, LAD 47m D1 30, LCX mild dzs, OM1 20p, OM2 251mOM3 90p (2.25x8 Promus Premier DES), RCA nl.   . HOH (hard of hearing)   . Chronic kidney disease (CKD), stage III (moderate)   . Cough     CHRONIC AT NIGHT  . Edema     FEET/LEGS  . History of pneumonia   . Lung cancer (HCOsagedx'd 2014    S/P radiation 2015  . Diverticulitis   . Multiple allergies   . PAF (paroxysmal atrial fibrillation) (HCC)     a. on amio, Toprol XL, Cardizem CD, and Eliquis 2.5 mg bid (age & SCr); b. CHADS2VASc ==> 7 (CHF, HTN, age x 2, DM, vascular disease and sex category)  . Hypertension    Social History   Social History  . Marital Status: Widowed    Spouse Name: N/A  . Number of Children: N/A  . Years of Education: N/A   Occupational History  . retired    Social History Main Topics  . Smoking status: Former Smoker -- 1.00 packs/day for 45 years    Types: Cigarettes    Quit date: 04/25/1998  . Smokeless tobacco: Never Used  . Alcohol Use: No     Comment: 12/23/2014 "might have a couple mixed drinks/year"  . Drug Use: No  . Sexual Activity: No   Other Topics Concern  . None   Social History Narrative   Lives locally with son.  Does not routinely exercise.   Family History  Problem Relation Age  of Onset  . Heart disease Mother   . Diabetes Mother   . Osteoarthritis Mother   . Hypertension Mother   . Heart disease Father   . Hypertension Father   . COPD Brother    Scheduled Meds: . ALPRAZolam  0.25 mg Oral QHS  . [START ON 03/09/2016] amiodarone  200 mg Oral Daily  . antiseptic oral rinse  7 mL Mouth Rinse q12n4p  . chlorhexidine  15 mL Mouth Rinse BID  . famotidine  20 mg Oral QHS  . fluticasone furoate-vilanterol  1 puff Inhalation Daily  . furosemide      . furosemide  20 mg Intravenous Q12H  . insulin aspart  0-24 Units Subcutaneous Q4H  . levalbuterol  0.63 mg Nebulization Once  . methylPREDNISolone sodium succinate      . methylPREDNISolone (SOLU-MEDROL) injection  40 mg Intravenous Q24H  . methylPREDNISolone (SOLU-MEDROL) injection  40 mg Intravenous Once  . metoprolol tartrate  25 mg Oral BID  . ondansetron      . ranolazine  500 mg Oral BID  . rivaroxaban  15 mg Oral Q supper  . senna-docusate  2 tablet Oral BID  . sertraline  50 mg Oral Daily  . sodium chloride flush  3 mL Intravenous Q12H  . sodium chloride flush  3 mL Intravenous Q12H  . tiotropium  18 mcg Inhalation Daily   Continuous Infusions: . sodium chloride    . sodium chloride 25 mL/hr at 03/06/16 0700  . sodium chloride    . amiodarone 30 mg/hr (03/08/16 0700)   PRN Meds:.sodium chloride, ondansetron (ZOFRAN) IV, sodium chloride flush, sodium chloride flush Medications Prior to Admission:  Prior to Admission medications   Medication Sig Start Date End Date Taking? Authorizing Provider  ALPRAZolam (XANAX) 0.25 MG tablet Take 0.25 mg by mouth 2 (two) times daily as needed for anxiety.   Yes Historical Provider, MD  amiodarone (PACERONE) 400 MG tablet Take 1 tablet (400 mg total) by mouth 2 (two) times daily. 03/03/16  Yes Minna Merritts, MD  atorvastatin (LIPITOR) 20 MG tablet Take 1 tablet (20 mg total) by mouth every evening. 12/03/15  Yes Minna Merritts, MD  cholecalciferol (VITAMIN D) 400  UNITS TABS tablet Take 800 Units by mouth every evening.    Yes Historical Provider, MD  cyanocobalamin 500 MCG tablet Take 500 mcg by mouth every evening.   Yes Historical Provider, MD  diltiazem (CARDIZEM CD) 240 MG 24 hr capsule Take 1 capsule (240 mg total) by mouth every evening. 02/16/16  Yes Ryan M Dunn, PA-C  febuxostat (ULORIC) 40 MG tablet Take 40 mg by mouth every evening.    Yes Historical Provider, MD  fenofibrate (TRICOR) 48 MG tablet Take 48 mg by mouth every evening.    Yes Historical Provider, MD  ferrous sulfate 325 (65 FE) MG EC tablet Take 325 mg by mouth every evening.    Yes Historical Provider, MD  fluticasone furoate-vilanterol (BREO ELLIPTA) 100-25 MCG/INH AEPB Inhale 1 puff into the lungs daily.   Yes Historical Provider, MD  furosemide (LASIX) 20 MG tablet Take 20 mg by mouth daily.  03/03/16  Yes Minna Merritts, MD  metoprolol succinate (TOPROL XL) 25 MG 24 hr tablet Take 50 mg by mouth 2 (two) times daily.  03/03/16  Yes Minna Merritts, MD  nitroGLYCERIN (NITROSTAT) 0.4 MG SL tablet Place 1 tablet (0.4 mg total) under the tongue every 5 (five) minutes as needed for chest pain. 12/24/14  Yes Rhonda G Barrett, PA-C  pantoprazole (PROTONIX) 40 MG tablet Take 40 mg by mouth every evening.    Yes Historical Provider, MD  Rivaroxaban (XARELTO) 15 MG TABS tablet Take 1 tablet (15 mg total) by mouth daily with supper. 02/16/16  Yes Ryan M Dunn, PA-C  sertraline (ZOLOFT) 50 MG tablet Take 50 mg by mouth every evening.    Yes Historical Provider, MD  tiotropium (SPIRIVA) 18 MCG inhalation capsule Place 18 mcg into inhaler and inhale daily.   Yes Historical Provider, MD   Allergies  Allergen Reactions  . Ciprofloxacin Shortness Of Breath, Itching and Rash  . Doxycycline Shortness Of Breath, Itching and Rash  . Penicillins Shortness Of Breath, Itching, Rash and Other (See Comments)    Has patient had a PCN reaction causing immediate rash, facial/tongue/throat swelling, SOB or  lightheadedness with hypotension: Yes Has patient had a PCN reaction causing severe rash involving mucus membranes or skin necrosis: No Has patient had a PCN reaction that required hospitalization No Has patient had a PCN reaction occurring within the last 10 years: No If all of the above answers are "NO", then may proceed with Cephalosporin use.  . Sulfa Antibiotics Shortness Of Breath, Itching and Rash  . Morphine And Related Itching  . Albuterol Sulfate   . Cefuroxime Rash    blisters   Review of Systems  Constitutional: Positive for fatigue.  Respiratory: Positive for shortness of breath.   Neurological: Positive for weakness.    Physical Exam  Constitutional: She appears well-developed. She appears ill.  HENT:  Mouth/Throat: Oropharynx is clear and moist.  Cardiovascular: Tachycardia present.   Pulmonary/Chest: She has decreased breath sounds.  Musculoskeletal:  BLE edema  Skin: Skin is warm and dry.  -pale    Vital Signs: BP 131/50 mmHg  Pulse 71  Temp(Src) 97.8 F (36.6 C) (Oral)  Resp 16  Ht '5\' 5"'$  (1.651 m)  Wt 87.7 kg (193 lb 5.5 oz)  BMI 32.17 kg/m2  SpO2 97% Pain Assessment: No/denies pain   Pain Score: 0-No pain   SpO2: SpO2: 97 % O2 Device:SpO2: 97 % O2 Flow Rate: .O2 Flow Rate (L/min): 4 L/min  IO: Intake/output summary:  Intake/Output Summary (Last 24 hours) at 03/08/16 1104 Last data filed at 03/08/16 0804  Gross per 24 hour  Intake 1197.3 ml  Output   1900 ml  Net -702.7 ml    LBM: Last BM Date: 03/04/16 Baseline Weight: Weight: 90.719 kg (200 lb) Most recent weight: Weight: 87.7 kg (193 lb 5.5 oz)      Palliative Assessment/Data: 30%   Flowsheet Rows        Most Recent Value   Intake Tab    Referral Department  Critical care   Unit at Time of Referral  ICU   Palliative Care Primary Diagnosis  Cardiac   Date Notified  03/06/16   Palliative Care Type  Return patient Palliative Care   Reason for referral  Clarify Goals of Care    Date of Admission  03/04/16   Date first seen by Palliative Care  03/08/16   # of days Palliative referral response time  2 Day(s)   # of days IP prior to Palliative referral  2   Clinical Assessment    Psychosocial & Spiritual Assessment    Palliative Care Outcomes       Discussed with bedside RN/Charlie  Time In: 1430 Time Out: 1555 Time Total: 85 min Greater than 50%  of this time was spent counseling and coordinating care related to the above assessment and plan.  Signed by: Wadie Lessen, NP   Please contact Palliative Medicine Team phone at [phone] for questions and concerns.  For individual provider: See Shea Evans

## 2016-03-08 NOTE — Anesthesia Postprocedure Evaluation (Signed)
Anesthesia Post Note  Patient: Regina Burke  Procedure(s) Performed: Procedure(s) (LRB): CARDIOVERSION (N/A)  Patient location during evaluation: PACU Anesthesia Type: General Level of consciousness: awake and alert and oriented Pain management: pain level controlled Vital Signs Assessment: post-procedure vital signs reviewed and stable Respiratory status: spontaneous breathing Cardiovascular status: blood pressure returned to baseline Anesthetic complications: no Comments: Patient with some endexpiratory wheeze as preop...will need to watch pulmonary function with extra increased flow due to atrial kick.  Extra IV steroids ordered.  Cardiology aware and critical care    Last Vitals:  Filed Vitals:   03/08/16 1000 03/08/16 1100  BP: 131/50 140/64  Pulse: 71 68  Temp:    Resp: 16 17    Last Pain:  Filed Vitals:   03/08/16 1114  PainSc: 0-No pain                 Mekel Haverstock

## 2016-03-08 NOTE — Progress Notes (Signed)
[facility]  ROUNDING NOTE   Subjective:   Cardioversion this morning. Back in Sinus rhythm Placed on Bipap. Furosemide  '20mg'$  IV was given this morning.   Objective:  Vital signs in last 24 hours:  Temp:  [97.8 F (36.6 C)-98.6 F (37 C)] 97.8 F (36.6 C) (07/12 0400) Pulse Rate:  [66-135] 75 (07/12 0910) Resp:  [12-35] 20 (07/12 0910) BP: (97-159)/(58-137) 116/86 mmHg (07/12 0910) SpO2:  [89 %-99 %] 93 % (07/12 0910) Weight:  [87.7 kg (193 lb 5.5 oz)] 87.7 kg (193 lb 5.5 oz) (07/12 0600)  Weight change: 1.5 kg (3 lb 4.9 oz) Filed Weights   03/06/16 0500 03/07/16 0519 03/08/16 0600  Weight: 85.7 kg (188 lb 15 oz) 86.2 kg (190 lb 0.6 oz) 87.7 kg (193 lb 5.5 oz)    Intake/Output: I/O last 3 completed shifts: In: 3875 [P.O.:620; I.V.:811] Out: 2525 [Urine:2525]   Intake/Output this shift:  Total I/O In: 200 [I.V.:200] Out: -   Physical Exam: General: Sitting up in bed  Head: [place]/AT  Eyes: Anicteric, PERRL  Neck: Supple, trachea midline  Lungs:  Bilateral basilar crackles, BiPAP+  Heart: Tachycardia, irregular  Abdomen:  Soft, nontender  Extremities:  no peripheral edema.  Neurologic: Nonfocal, moving all four extremities  Skin: No lesions       Basic Metabolic Panel:  Recent Labs Lab 03/04/16 2100 03/05/16 0505 03/06/16 0433 03/07/16 0447 03/08/16 0335  NA 136 139 138 137 139  K 4.3 4.1 4.7 4.3 4.1  CL 96* 103 100* 96* 96*  CO2 '30 27 27 30 31  '$ GLUCOSE 273* 150* 124* 115* 108*  BUN 86* 86* 92* 96* 108*  CREATININE 2.98* 2.86* 3.01* 3.08* 2.75*  CALCIUM 8.8* 8.1* 8.6* 8.7* 9.0  MG  --  2.4  --   --   --   PHOS  --  5.3*  --  6.1*  --     Liver Function Tests:  Recent Labs Lab 03/07/16 0447  ALBUMIN 3.6   No results for input(s): LIPASE, AMYLASE in the last 168 hours. No results for input(s): AMMONIA in the last 168 hours.  CBC:  Recent Labs Lab 03/04/16 2100 03/05/16 0505 03/07/16 0447  WBC 15.4* 15.5* 8.5  NEUTROABS  12.1*  --   --   HGB 11.1* 10.9* 9.5*  HCT 33.6* 32.6* 28.2*  MCV 80.7 80.9 80.5  PLT 482* 446* 245    Cardiac Enzymes:  Recent Labs Lab 03/04/16 2100  TROPONINI <0.03    BNP: Invalid input(s): POCBNP  CBG:  Recent Labs Lab 03/07/16 1639 03/07/16 2035 03/07/16 2356 03/08/16 0403 03/08/16 0724  GLUCAP 161* 166* 98 119* 140*    Microbiology: Results for orders placed or performed during the hospital encounter of 03/04/16  Blood Culture (routine x 2)     Status: None (Preliminary result)   Collection Time: 03/04/16  9:40 PM  Result Value Ref Range Status   Specimen Description BLOOD LEFT HAND  Final   Special Requests   Final    BOTTLES DRAWN AEROBIC AND ANAEROBIC 11CCAERO,12CCANA   Culture NO GROWTH 3 DAYS  Final   Report Status PENDING  Incomplete  Blood Culture (routine x 2)     Status: None (Preliminary result)   Collection Time: 03/04/16  9:40 PM  Result Value Ref Range Status   Specimen Description BLOOD RIGHT HAND  Final   Special Requests   Final    BOTTLES DRAWN AEROBIC AND ANAEROBIC [place]   Culture NO GROWTH  3 DAYS  Final   Report Status PENDING  Incomplete  MRSA PCR Screening     Status: None   Collection Time: 03/05/16 12:30 AM  Result Value Ref Range Status   MRSA by PCR NEGATIVE NEGATIVE Final    Comment:        The GeneXpert MRSA Assay (FDA approved for NASAL specimens only), is one component of a comprehensive MRSA colonization surveillance program. It is not intended to diagnose MRSA infection nor to guide or monitor treatment for MRSA infections.   Urine culture     Status: None   Collection Time: 03/05/16 10:33 AM  Result Value Ref Range Status   Specimen Description URINE, RANDOM  Final   Special Requests NONE  Final   Culture NO GROWTH Performed at [facility]   Final   Report Status 03/06/2016 FINAL  Final    Coagulation Studies: No results for input(s): LABPROT, INR in the last 72  hours.  Urinalysis:  Recent Labs  03/05/16 1033  COLORURINE YELLOW*  LABSPEC 1.014  PHURINE 5.0  GLUCOSEU NEGATIVE  HGBUR NEGATIVE  BILIRUBINUR NEGATIVE  KETONESUR NEGATIVE  PROTEINUR 30*  NITRITE NEGATIVE  LEUKOCYTESUR NEGATIVE      Imaging: Ct Chest Wo Contrast  03/07/2016  CLINICAL DATA:  History of COPD with respiratory distress for the past 3 days EXAM: CT CHEST WITHOUT CONTRAST TECHNIQUE: Multidetector CT imaging of the chest was performed following the standard protocol without IV contrast. COMPARISON:  03/04/2016, 01/21/2016 FINDINGS: Cardiovascular: Calcifications of the thoracic aorta are noted without aneurysmal dilatation. The cardiac structures show coronary calcifications. Mediastinum/Nodes: The thoracic inlet demonstrates some nodularity within the right lobe of the thyroid. This is stable from the prior exam. No significant hilar or mediastinal adenopathy is noted. Lungs/Pleura: Bilateral pleural effusions are seen left slightly greater than right. Some associated lower lobe consolidation is noted again left greater than right. These changes are relatively stable from the prior exam. The ground-glass density seen in the left upper lobe is again identified and has improved somewhat in the interval from the prior exam. Scarring in the right upper lobe is again noted stable from the prior exam as well. No new focal infiltrate is seen. Upper Abdomen: Stable cystic lesions are again seen in the spleen. Right adrenal lesion is again noted and stable. Musculoskeletal: No acute bony abnormality noted. IMPRESSION: Bilateral pleural effusions and associated lower lobe consolidation stable from the prior study. Stable changes in the right upper lobe similar to that seen on the recent exam. Slight improvement in the ground-glass density within the left upper lobe. Electronically Signed   By: Alcide Clever M.D.   On: 03/07/2016 16:36     Medications:   . sodium chloride    . sodium  chloride 25 mL/hr at 03/06/16 0700  . sodium chloride    . amiodarone 30 mg/hr (03/08/16 0700)   . ALPRAZolam  0.25 mg Oral QHS  . [START ON 03/09/2016] amiodarone  200 mg Oral Daily  . antiseptic oral rinse  7 mL Mouth Rinse q12n4p  . chlorhexidine  15 mL Mouth Rinse BID  . famotidine  20 mg Oral QHS  . fluticasone furoate-vilanterol  1 puff Inhalation Daily  . furosemide      . furosemide  20 mg Intravenous Q12H  . insulin aspart  0-24 Units Subcutaneous Q4H  . levalbuterol  0.63 mg Nebulization Once  . methylPREDNISolone sodium succinate      . methylPREDNISolone (SOLU-MEDROL) injection  40 mg  Intravenous Q24H  . methylPREDNISolone (SOLU-MEDROL) injection  40 mg Intravenous Once  . metoprolol tartrate  25 mg Oral BID  . ondansetron      . ranolazine  500 mg Oral BID  . rivaroxaban  15 mg Oral Q supper  . sertraline  50 mg Oral Daily  . sodium chloride flush  3 mL Intravenous Q12H  . sodium chloride flush  3 mL Intravenous Q12H  . tiotropium  18 mcg Inhalation Daily   sodium chloride, docusate, ondansetron (ZOFRAN) IV, sodium chloride flush, sodium chloride flush  Assessment/ Plan:  Regina Burke is a 82 y.o. white female with diabetes mellitus type II, coronary artery disease, atrial fibrillation, hypertension, hyperlipidemia, generalized anxiety disorder, gout, anemia, history of lung cancer, COPD, atrial fibrillation, fatty liver disease, GERD, osteoporosis   1. Acute Renal Failure on Chronic kidney disease stage III with history of hyperkalemia and proteinuria: Baseline creatinine of 1.5, eGFR of 32.  Currently off ACE-I/ARB  due to hyperkalemia.   Chronic Kidney disease secondary to diabetes and hypertension.  - Monitor potassium level. No acute indication for dialysis. Continue to monitor urine output and volume status.  - Continue fursoemide. Nonoliguric urine output.   2. Hypertension, atrial fibrillation with rapid ventricular response status post cardioversion.  Placed on amiodarone - Continue diltiazem and metoprolol for rate control.  - furosemide '20mg'$  IV q12  3. Anemia with chronic kidney disease: Hemoglobin stable.    LOS: [facility] [place] 7/12/20179:47 AM

## 2016-03-08 NOTE — Transfer of Care (Signed)
Immediate Anesthesia Transfer of Care Note  Patient: Regina Burke  Procedure(s) Performed: Procedure(s): CARDIOVERSION (N/A)  Patient Location: spu  Anesthesia Type:General  Level of Consciousness: awake  Airway & Oxygen Therapy: Patient Spontanous Breathing and Patient connected to nasal cannula oxygen  Post-op Assessment: Report given to RN and Post -op Vital signs reviewed and stable  Post vital signs: Reviewed  Last Vitals:  Filed Vitals:   03/08/16 0800 03/08/16 0807  BP:  146/74  Pulse: 112 68  Temp:    Resp: 17 22    Last Pain:  Filed Vitals:   03/08/16 0807  PainSc: 0-No pain         Complications: No apparent anesthesia complications

## 2016-03-08 NOTE — Progress Notes (Signed)
PULMONARY / CRITICAL CARE MEDICINE   Name: Regina Burke MRN: [id number] DOB: Sep 12, 1932    ADMISSION DATE:  03/04/2016 CONSULTATION DATE:  03/04/16  REFERRING MD: EDP  CHIEF COMPLAINT:Acute shortness of breath  DISCUSSION  82 year old female with multiple comorbidities such as COPD, CHF, chronic kidney disease, hypertension, sleep apnea now presenting with bilateral lower lobe opacities likely related to acute CHF with uncontrolled Afib. Patient was cardioverted on 7/12. SR currently. Patient is off and on BiPAP.   SUBJECTIVE:  Is off and on BiPAP. Patient was cardioverted this morning placed back on biPAP this AM for increased WOB and SOB   No current facility-administered medications on file prior to encounter.   Current Outpatient Prescriptions on File Prior to Encounter  Medication Sig  . ALPRAZolam (XANAX) 0.25 MG tablet Take 0.25 mg by mouth 2 (two) times daily as needed for anxiety.  Marland Kitchen amiodarone (PACERONE) 400 MG tablet Take 1 tablet (400 mg total) by mouth 2 (two) times daily.  Marland Kitchen atorvastatin (LIPITOR) 20 MG tablet Take 1 tablet (20 mg total) by mouth every evening.  . cholecalciferol (VITAMIN D) 400 UNITS TABS tablet Take 800 Units by mouth every evening.   . cyanocobalamin 500 MCG tablet Take 500 mcg by mouth every evening.  . diltiazem (CARDIZEM CD) 240 MG 24 hr capsule Take 1 capsule (240 mg total) by mouth every evening.  . febuxostat (ULORIC) 40 MG tablet Take 40 mg by mouth every evening.   . fenofibrate (TRICOR) 48 MG tablet Take 48 mg by mouth every evening.   . ferrous sulfate 325 (65 FE) MG EC tablet Take 325 mg by mouth every evening.   . fluticasone furoate-vilanterol (BREO ELLIPTA) 100-25 MCG/INH AEPB Inhale 1 puff into the lungs daily.  . furosemide (LASIX) 20 MG tablet Take 20 mg by mouth daily.   . metoprolol succinate (TOPROL XL) 25 MG 24 hr tablet Take 50 mg by mouth 2 (two) times daily.   . nitroGLYCERIN (NITROSTAT) 0.4 MG SL tablet Place 1 tablet (0.4  mg total) under the tongue every 5 (five) minutes as needed for chest pain.  . pantoprazole (PROTONIX) 40 MG tablet Take 40 mg by mouth every evening.   . Rivaroxaban (XARELTO) 15 MG TABS tablet Take 1 tablet (15 mg total) by mouth daily with supper.  . sertraline (ZOLOFT) 50 MG tablet Take 50 mg by mouth every evening.   . tiotropium (SPIRIVA) 18 MCG inhalation capsule Place 18 mcg into inhaler and inhale daily.     VITAL SIGNS: BP 131/50 mmHg  Pulse 71  Temp(Src) 97.8 F (36.6 C) (Oral)  Resp 16  Ht '5\' 5"'$  (1.651 m)  Wt 87.7 kg (193 lb 5.5 oz)  BMI 32.17 kg/m2  SpO2 97%   Vent Mode:  [-]  FiO2 (%):  [40 %] 40 %  INTAKE / OUTPUT: I/O last 3 completed shifts: In: 3267 [P.O.:620; I.V.:811] Out: 2525 [Urine:2525]  PHYSICAL EXAMINATION: General:Elderly white female found on BiPAP  Neuro:   follows commands  HEENT: Atraumatic, normocephalic, no discharge noted  Cardiovascular: regular, SR, S1S2, No MRG noted. Lungs:  Clear bilaterally, symmetric chest expansion, no wheezes crackles rhonchi noted. Abdomen:  Soft, nontender, active bowel sounds Musculoskeletal:  No inflammation/deformity noted Skin:  Grossly intact  LABS:  BMET  Recent Labs Lab 03/06/16 0433 03/07/16 0447 03/08/16 0335  NA 138 137 139  K 4.7 4.3 4.1  CL 100* 96* 96*  CO2 '27 30 31  '$ BUN 92* 96* 108*  CREATININE  3.01* 3.08* 2.75*  GLUCOSE 124* 115* 108*    Electrolytes  Recent Labs Lab 03/05/16 0505 03/06/16 0433 03/07/16 0447 03/08/16 0335  CALCIUM 8.1* 8.6* 8.7* 9.0  MG 2.4  --   --   --   PHOS 5.3*  --  6.1*  --     CBC  Recent Labs Lab 03/04/16 2100 03/05/16 0505 03/07/16 0447  WBC 15.4* 15.5* 8.5  HGB 11.1* 10.9* 9.5*  HCT 33.6* 32.6* 28.2*  PLT 482* 446* 245    Coag's  Recent Labs Lab 03/05/16 0722  INR 1.51    Sepsis Markers  Recent Labs Lab 03/04/16 2100 03/04/16 2140 03/05/16 0040 03/05/16 0505 03/06/16 0433  LATICACIDVEN  --  0.9 1.0  --   --    PROCALCITON <0.10  --   --  0.26 0.63    ABG  Recent Labs Lab 03/04/16 2111 03/05/16 0454  PHART 7.23* 7.30*  PCO2ART 80* 63*  PO2ART 83 111*    Liver Enzymes  Recent Labs Lab 03/07/16 0447  ALBUMIN 3.6    Cardiac Enzymes  Recent Labs Lab 03/04/16 2100  TROPONINI <0.03    Glucose  Recent Labs Lab 03/07/16 1145 03/07/16 1639 03/07/16 2035 03/07/16 2356 03/08/16 0403 03/08/16 0724  GLUCAP 163* 161* 166* 98 119* 140*    Imaging Ct Chest Wo Contrast  03/07/2016  CLINICAL DATA:  History of COPD with respiratory distress for the past 3 days EXAM: CT CHEST WITHOUT CONTRAST TECHNIQUE: Multidetector CT imaging of the chest was performed following the standard protocol without IV contrast. COMPARISON:  03/04/2016, 01/21/2016 FINDINGS: Cardiovascular: Calcifications of the thoracic aorta are noted without aneurysmal dilatation. The cardiac structures show coronary calcifications. Mediastinum/Nodes: The thoracic inlet demonstrates some nodularity within the right lobe of the thyroid. This is stable from the prior exam. No significant hilar or mediastinal adenopathy is noted. Lungs/Pleura: Bilateral pleural effusions are seen left slightly greater than right. Some associated lower lobe consolidation is noted again left greater than right. These changes are relatively stable from the prior exam. The ground-glass density seen in the left upper lobe is again identified and has improved somewhat in the interval from the prior exam. Scarring in the right upper lobe is again noted stable from the prior exam as well. No new focal infiltrate is seen. Upper Abdomen: Stable cystic lesions are again seen in the spleen. Right adrenal lesion is again noted and stable. Musculoskeletal: No acute bony abnormality noted. IMPRESSION: Bilateral pleural effusions and associated lower lobe consolidation stable from the prior study. Stable changes in the right upper lobe similar to that seen on the  recent exam. Slight improvement in the ground-glass density within the left upper lobe. Electronically Signed   By: Inez Catalina M.D.   On: 03/07/2016 16:36     STUDIES:  none  CULTURES: 7/8 blood cultures >> 7/18 urine culture >> 7/8 sputum culture >>  ANTIBIOTICS: 7/8 vancomycin>>7/10 7/8 azactam>>7/10   LINES/TUBES: None  ASSESSMENT / PLAN: 82 year old female with multiple comorbidities such as COPD, CHF, chronic kidney disease, hypertension, sleep apnea now presenting with bilateral lower lobe opacities likely related to acute CHF with uncontrolled Afib PULMONARY A:  Acute on chronic respiratory failure History of sleep apnea  Hx of right upper lobe cancer  P:   Continue BiPAP as needed, high risk for intubation abg and cxr as needed Will d/c Vancomycin/Azactam Continue Bronchodilators Wean steroids  CARDIOVASCULAR A:  Shock possible caridiogenic Hypotension-resolved History of hypertension Atrial fibrillation- cardioverted on  03/08/16 History of Hyperlipidemia History of chronic diastolic CHF  P:  Keep MAP goals>65 Follow up cardiology recs for cardioversion. continue amiodarone  RENAL A:   Acute on chronic kidney disease P:  Renal diet  Replace electrolytes per ICU protocol Follow chemistry   GASTROINTESTINAL A:   History of GERD P:  On diet Pepcid   HEMATOLOGIC A:   No active issues P:  Transfuse if Hgb< 7 lovenox for DVT prophylaxis  INFECTIOUS D/c abx P:  I/v fluids Follow cultures MAP Goals>65 Pro-calcitonin<0.10, lactic acid-0.9  ENDOCRINE A:   Diabetes Melitus type-2 P:   Blood sugar checks q4 hours ssi coverage  NEUROLOGIC A:   Hx of depression P:   RASS goal:0 Continue  zoloft   Note: Palliative care consulted to determine the Goals of care, as the patient expressed that she has a DNR paper work at home and would want aggressive medical care for only  For short period of time if her there is a significant  improvement in her medical condition.     Bincy Varughese,AG-ACNP Pulmonary & Critical Care   STAFF NOTE: I, Dr. Corrin Parker,  have personally reviewed patient's available data, including medical history, events of note, physical examination and test results as part of my evaluation. I have discussed with NP and other care providers such as pharmacist, RN and RRT.  In addition,  I personally evaluated patient and elicited key findings  +Resp distress   A:on biPAP  P: high risk for intubation     The Rest per NP whose note is outlined above and that I agree with    The Patient requires high complexity decision making for assessment and support, frequent evaluation and titration of therapies, application of advanced monitoring technologies and extensive interpretation of multiple databases. Critical Care Time devoted to patient care services described in this note is 35 minutes.   This Critical care time does not reflrect procedure time or supervisory time of NP but could involve care discussion time Overall, patient is critically ill, prognosis is guarded.  Patient with Multiorgan failure and at high risk for cardiac arrest and death.  Follow up palliative care consult  Corrin Parker, M.D.  [facility]  Medical Director [place] Director [facility] Cardio-Pulmonary Department

## 2016-03-09 DIAGNOSIS — J441 Chronic obstructive pulmonary disease with (acute) exacerbation: Secondary | ICD-10-CM

## 2016-03-09 DIAGNOSIS — Z7189 Other specified counseling: Secondary | ICD-10-CM | POA: Insufficient documentation

## 2016-03-09 DIAGNOSIS — Z515 Encounter for palliative care: Secondary | ICD-10-CM | POA: Insufficient documentation

## 2016-03-09 LAB — RENAL FUNCTION PANEL
ALBUMIN: 3.5 g/dL (ref 3.5–5.0)
ANION GAP: 10 (ref 5–15)
BUN: 110 mg/dL — AB (ref 6–20)
CALCIUM: 8.7 mg/dL — AB (ref 8.9–10.3)
CO2: 33 mmol/L — ABNORMAL HIGH (ref 22–32)
Chloride: 96 mmol/L — ABNORMAL LOW (ref 101–111)
Creatinine, Ser: 2.29 mg/dL — ABNORMAL HIGH (ref 0.44–1.00)
GFR calc Af Amer: 22 mL/min — ABNORMAL LOW (ref >60)
GFR calc non Af Amer: 19 mL/min — ABNORMAL LOW (ref >60)
GLUCOSE: 135 mg/dL — AB (ref 65–99)
PHOSPHORUS: 5.8 mg/dL — AB (ref 2.5–4.6)
Potassium: 4.2 mmol/L (ref 3.5–5.1)
SODIUM: 139 mmol/L (ref 135–145)

## 2016-03-09 LAB — CBC
HEMATOCRIT: 28.6 % — AB (ref 35.0–47.0)
HEMOGLOBIN: 9.6 g/dL — AB (ref 12.0–16.0)
MCH: 26.7 pg (ref 26.0–34.0)
MCHC: 33.4 g/dL (ref 32.0–36.0)
MCV: 79.8 fL — ABNORMAL LOW (ref 80.0–100.0)
Platelets: 201 10*3/uL (ref 150–440)
RBC: 3.58 MIL/uL — AB (ref 3.80–5.20)
RDW: 16.3 % — ABNORMAL HIGH (ref 11.5–14.5)
WBC: 4.9 10*3/uL (ref 3.6–11.0)

## 2016-03-09 LAB — BRAIN NATRIURETIC PEPTIDE: B NATRIURETIC PEPTIDE 5: 1595 pg/mL — AB (ref 0.0–100.0)

## 2016-03-09 LAB — CULTURE, BLOOD (ROUTINE X 2)
Culture: NO GROWTH
Culture: NO GROWTH

## 2016-03-09 LAB — GLUCOSE, CAPILLARY
GLUCOSE-CAPILLARY: 136 mg/dL — AB (ref 65–99)
GLUCOSE-CAPILLARY: 160 mg/dL — AB (ref 65–99)
GLUCOSE-CAPILLARY: 254 mg/dL — AB (ref 65–99)
Glucose-Capillary: 126 mg/dL — ABNORMAL HIGH (ref 65–99)
Glucose-Capillary: 186 mg/dL — ABNORMAL HIGH (ref 65–99)
Glucose-Capillary: 189 mg/dL — ABNORMAL HIGH (ref 65–99)

## 2016-03-09 MED ORDER — FUROSEMIDE 20 MG PO TABS
20.0000 mg | ORAL_TABLET | Freq: Two times a day (BID) | ORAL | Status: DC
  Administered 2016-03-09 (×2): 20 mg via ORAL
  Filled 2016-03-09: qty 1

## 2016-03-09 MED ORDER — FENTANYL CITRATE (PF) 100 MCG/2ML IJ SOLN: 25.0000 ug | INTRAMUSCULAR | Status: DC | PRN

## 2016-03-09 MED ORDER — BISACODYL 10 MG RE SUPP
10.0000 mg | Freq: Once | RECTAL | Status: AC
Start: 2016-03-09 — End: 2016-03-09
  Administered 2016-03-09: 10 mg via RECTAL
  Filled 2016-03-09: qty 1

## 2016-03-09 MED ORDER — AMIODARONE HCL 200 MG PO TABS: 200.0000 mg | ORAL_TABLET | Freq: Two times a day (BID) | ORAL | Status: DC

## 2016-03-09 MED ORDER — ONDANSETRON HCL 4 MG/2ML IJ SOLN: 4.0000 mg | Freq: Once | INTRAMUSCULAR | Status: DC | PRN

## 2016-03-09 NOTE — Progress Notes (Signed)
[facility]  ROUNDING NOTE   Subjective:   UOP 2145 (1900) Creatinine 2.29 (2.75)  Furosemide '20mg'$  IV q12  Objective:  Vital signs in last 24 hours:  Temp:  [97.9 F (36.6 C)-98.3 F (36.8 C)] 98.3 F (36.8 C) (07/12 1900) Pulse Rate:  [59-124] 94 (07/13 0900) Resp:  [12-24] 15 (07/13 0900) BP: (78-140)/(58-97) 111/77 mmHg (07/13 0900) SpO2:  [93 %-100 %] 96 % (07/13 0900) FiO2 (%):  [40 %] 40 % (07/12 1200) Weight:  [85 kg (187 lb 6.3 oz)] 85 kg (187 lb 6.3 oz) (07/13 0442)  Weight change: -2.7 kg (-5 lb 15.2 oz) Filed Weights   03/07/16 0519 03/08/16 0600 03/09/16 0442  Weight: 86.2 kg (190 lb 0.6 oz) 87.7 kg (193 lb 5.5 oz) 85 kg (187 lb 6.3 oz)    Intake/Output: I/O last 3 completed shifts: In: 1174.4 [P.O.:320; I.V.:854.4] Out: 3095 [Urine:3095]   Intake/Output this shift:  Total I/O In: 250 [P.O.:250] Out: 500 [Urine:500]  Physical Exam: General: Sitting up in bed  Head: [place]/AT  Eyes: Anicteric, PERRL  Neck: Supple, trachea midline  Lungs:  Bilateral basilar crackles, 4L [place]  Heart: Tachycardia, irregular  Abdomen:  Soft, nontender  Extremities:  no peripheral edema.  Neurologic: Nonfocal, moving all four extremities  Skin: No lesions       Basic Metabolic Panel:  Recent Labs Lab 03/05/16 0505 03/06/16 0433 03/07/16 0447 03/08/16 0335 03/09/16 0648  NA 139 138 137 139 139  K 4.1 4.7 4.3 4.1 4.2  CL 103 100* 96* 96* 96*  CO2 '27 27 30 31 '$ 33*  GLUCOSE 150* 124* 115* 108* 135*  BUN 86* 92* 96* 108* 110*  CREATININE 2.86* 3.01* 3.08* 2.75* 2.29*  CALCIUM 8.1* 8.6* 8.7* 9.0 8.7*  MG 2.4  --   --   --   --   PHOS 5.3*  --  6.1*  --  5.8*    Liver Function Tests:  Recent Labs Lab 03/07/16 0447 03/09/16 0648  ALBUMIN 3.6 3.5   No results for input(s): LIPASE, AMYLASE in the last 168 hours. No results for input(s): AMMONIA in the last 168 hours.  CBC:  Recent Labs Lab 03/04/16 2100 03/05/16 0505 03/07/16 0447  03/09/16 0648  WBC 15.4* 15.5* 8.5 4.9  NEUTROABS 12.1*  --   --   --   HGB 11.1* 10.9* 9.5* 9.6*  HCT 33.6* 32.6* 28.2* 28.6*  MCV 80.7 80.9 80.5 79.8*  PLT 482* 446* 245 201    Cardiac Enzymes:  Recent Labs Lab 03/04/16 2100  TROPONINI <0.03    BNP: Invalid input(s): POCBNP  CBG:  Recent Labs Lab 03/08/16 1536 03/08/16 1955 03/08/16 2355 03/09/16 0415 03/09/16 0719  GLUCAP 136* 151* 211* 136* 160*    Microbiology: Results for orders placed or performed during the hospital encounter of 03/04/16  Blood Culture (routine x 2)     Status: None   Collection Time: 03/04/16  9:40 PM  Result Value Ref Range Status   Specimen Description BLOOD LEFT HAND  Final   Special Requests   Final    BOTTLES DRAWN AEROBIC AND ANAEROBIC 11CCAERO,12CCANA   Culture NO GROWTH 5 DAYS  Final   Report Status 03/09/2016 FINAL  Final  Blood Culture (routine x 2)     Status: None   Collection Time: 03/04/16  9:40 PM  Result Value Ref Range Status   Specimen Description BLOOD RIGHT HAND  Final   Special Requests   Final    BOTTLES  DRAWN AEROBIC AND ANAEROBIC [place]   Culture NO GROWTH 5 DAYS  Final   Report Status 03/09/2016 FINAL  Final  MRSA PCR Screening     Status: None   Collection Time: 03/05/16 12:30 AM  Result Value Ref Range Status   MRSA by PCR NEGATIVE NEGATIVE Final    Comment:        The GeneXpert MRSA Assay (FDA approved for NASAL specimens only), is one component of a comprehensive MRSA colonization surveillance program. It is not intended to diagnose MRSA infection nor to guide or monitor treatment for MRSA infections.   Urine culture     Status: None   Collection Time: 03/05/16 10:33 AM  Result Value Ref Range Status   Specimen Description URINE, RANDOM  Final   Special Requests NONE  Final   Culture NO GROWTH Performed at [facility]   Final   Report Status 03/06/2016 FINAL  Final    Coagulation Studies: No results for input(s):  LABPROT, INR in the last 72 hours.  Urinalysis: No results for input(s): COLORURINE, LABSPEC, PHURINE, GLUCOSEU, HGBUR, BILIRUBINUR, KETONESUR, PROTEINUR, UROBILINOGEN, NITRITE, LEUKOCYTESUR in the last 72 hours.  Invalid input(s): APPERANCEUR    Imaging: Ct Chest Wo Contrast  03/07/2016  CLINICAL DATA:  History of COPD with respiratory distress for the past 3 days EXAM: CT CHEST WITHOUT CONTRAST TECHNIQUE: Multidetector CT imaging of the chest was performed following the standard protocol without IV contrast. COMPARISON:  03/04/2016, 01/21/2016 FINDINGS: Cardiovascular: Calcifications of the thoracic aorta are noted without aneurysmal dilatation. The cardiac structures show coronary calcifications. Mediastinum/Nodes: The thoracic inlet demonstrates some nodularity within the right lobe of the thyroid. This is stable from the prior exam. No significant hilar or mediastinal adenopathy is noted. Lungs/Pleura: Bilateral pleural effusions are seen left slightly greater than right. Some associated lower lobe consolidation is noted again left greater than right. These changes are relatively stable from the prior exam. The ground-glass density seen in the left upper lobe is again identified and has improved somewhat in the interval from the prior exam. Scarring in the right upper lobe is again noted stable from the prior exam as well. No new focal infiltrate is seen. Upper Abdomen: Stable cystic lesions are again seen in the spleen. Right adrenal lesion is again noted and stable. Musculoskeletal: No acute bony abnormality noted. IMPRESSION: Bilateral pleural effusions and associated lower lobe consolidation stable from the prior study. Stable changes in the right upper lobe similar to that seen on the recent exam. Slight improvement in the ground-glass density within the left upper lobe. Electronically Signed   By: Inez Catalina M.D.   On: 03/07/2016 16:36     Medications:     . ALPRAZolam  0.25 mg Oral QHS   . antiseptic oral rinse  7 mL Mouth Rinse q12n4p  . chlorhexidine  15 mL Mouth Rinse BID  . diltiazem  240 mg Oral Daily  . famotidine  20 mg Oral QHS  . fluticasone furoate-vilanterol  1 puff Inhalation Daily  . furosemide  20 mg Oral BID  . insulin aspart  0-24 Units Subcutaneous Q4H  . levalbuterol  0.63 mg Nebulization Once  . methylPREDNISolone (SOLU-MEDROL) injection  40 mg Intravenous Q24H  . metoprolol tartrate  25 mg Oral Q6H  . ranolazine  500 mg Oral BID  . rivaroxaban  15 mg Oral Q supper  . senna-docusate  2 tablet Oral BID  . sertraline  50 mg Oral Daily  . tiotropium  18  mcg Inhalation Daily   sodium chloride, metoprolol, ondansetron (ZOFRAN) IV  Assessment/ Plan:  Ms. AURELIA GRAS is a 82 y.o. white female with diabetes mellitus type II, coronary artery disease, atrial fibrillation, hypertension, hyperlipidemia, generalized anxiety disorder, gout, anemia, history of lung cancer, COPD, atrial fibrillation, fatty liver disease, GERD, osteoporosis   1. Acute Renal Failure on Chronic kidney disease stage III with history of hyperkalemia and proteinuria: Baseline creatinine of 1.5, eGFR of 32.  Currently off ACE-I/ARB  due to hyperkalemia.   Chronic Kidney disease secondary to diabetes and hypertension.  - Monitor potassium level. No acute indication for dialysis. Continue to monitor urine output and volume status.  - Continue fursoemide. Nonoliguric urine output.   2. Hypertension, atrial fibrillation with rapid ventricular response status post cardioversion. Placed on amiodarone gtt - Continue diltiazem and metoprolol for rate control.  - furosemide '20mg'$  IV q12  3. Anemia with chronic kidney disease: Hemoglobin stable.    LOS: [place], [place] 7/13/201710:15 AM

## 2016-03-09 NOTE — Care Management (Signed)
Spoke with son, [name].The plan is to meet with son, RNCM and patients sister at 9:30 am 07/14 to discuss options for SNF tomorrow.

## 2016-03-09 NOTE — Progress Notes (Signed)
Regina Burke is alert and oriented. No c/o pain. VSS. Pt was on BIPAP  twice throughout the night. BIPAP just put back on again this morning at 0515 due to SOB. Pt tolerates being on 4L nasal cannula at times. Foley intact. Adequate urine output. Pt remains in A.Fib. At home dose of Cardizem started  Last night due to HR staying at 115-120. HR is more controlled now. Amiodarone drip still infusing. No BM. Pt refused stool softner. Will continue to monitor.

## 2016-03-09 NOTE — Progress Notes (Signed)
[place] for constipation management    Allergies  Allergen Reactions  . Ciprofloxacin Shortness Of Breath, Itching and Rash  . Doxycycline Shortness Of Breath, Itching and Rash  . Penicillins Shortness Of Breath, Itching, Rash and Other (See Comments)    Has patient had a PCN reaction causing immediate rash, facial/tongue/throat swelling, SOB or lightheadedness with hypotension: Yes Has patient had a PCN reaction causing severe rash involving mucus membranes or skin necrosis: No Has patient had a PCN reaction that required hospitalization No Has patient had a PCN reaction occurring within the last 10 years: No If all of the above answers are "NO", then may proceed with Cephalosporin use.  . Sulfa Antibiotics Shortness Of Breath, Itching and Rash  . Morphine And Related Itching  . Albuterol Sulfate   . Cefuroxime Rash    blisters    Patient Measurements: Height: '5\' 5"'$  (165.1 cm) Weight: 187 lb 6.3 oz (85 kg) IBW/kg (Calculated) : 57  Vital Signs: BP: 111/77 mmHg (07/13 0900) Pulse Rate: 94 (07/13 0900) Intake/Output from previous day: 07/12 0701 - 07/13 0700 In: 890.7 [P.O.:320; I.V.:570.7] Out: 2145 [Urine:2145] Intake/Output from this shift: Total I/O In: 250 [P.O.:250] Out: 500 [Urine:500]  Labs:  Recent Labs  03/07/16 0447 03/08/16 0335 03/09/16 0648  WBC 8.5  --  4.9  HGB 9.5*  --  9.6*  HCT 28.2*  --  28.6*  PLT 245  --  201  CREATININE 3.08* 2.75* 2.29*  PHOS 6.1*  --  5.8*  ALBUMIN 3.6  --  3.5   Estimated Creatinine Clearance: 21.1 mL/min (by C-G formula based on Cr of 2.29).   Assessment: Pharmacy consulted for constipation management for 82 yo female ICU patient. Patient has not had documented bowel movement this admission. Patient ordered senna/docusate 2 tabs BID starting 7/12. Patient refused evening dose on 7/12.   Plan:  Patient took am dose. Patient requesting escalation in therapy. Will order  bisacodyl suppository '10mg'$  PR x 1.   Will assess for bowel movement in am.   Pharmacy will continue to monitor and adjust per consult.   Simpson,Michael L 03/09/2016,2:39 PM

## 2016-03-09 NOTE — Plan of Care (Signed)
Problem: Physical Regulation: Goal: Ability to maintain clinical measurements within normal limits will improve Vital signs within normal limits. On BIPAP intermittently  Problem: Fluid Volume: Goal: Ability to maintain a balanced intake and output will improve Foley in place. Monitoring strict I and O's. 1200 ml fluid restriction  Problem: Consults Goal: Diabetes Guidelines if Diabetic/Glucose > 140 If diabetic or lab glucose is > 140 mg/dl - Initiate Diabetes/Hyperglycemia Guidelines & Document Interventions  Outcome: Progressing SSI  Problem: ICU Phase Progression Outcomes Goal: Hemodynamically stable Outcome: Progressing BP stable Goal: Voiding-avoid urinary catheter unless indicated Outcome: Progressing Urinary catheter in place

## 2016-03-09 NOTE — Care Management (Signed)
Intensivist states patient needs a home bipap. Requested from [facility]. remains short of breath, desaturating easily, on IV lasix. Cardioverted 07/12 with brief success. On Amnio gtt. PT eval pending.

## 2016-03-09 NOTE — Clinical Social Work Note (Signed)
CSW made aware of PT recommendations for STR however PT documents that patient is refusing STR and is wanting home services arranged. RN CM made aware. Shela Leff MSW,LCSW [phone]

## 2016-03-09 NOTE — Progress Notes (Signed)
Initial Heart Failure Clinic appointment scheduled for March 22, 2016 at 12:30pm. Thank you.

## 2016-03-09 NOTE — Progress Notes (Signed)
1730 Better day. Up in chair x 2 + hours. Paperwork started to qualify [name] for home BiPAP. She is already on home O2 at 3 L per nasal cannula. Walked with PT. Gait steady with walker.

## 2016-03-09 NOTE — Progress Notes (Signed)
Refuses enema or more aggressive stool softner. Refused senna dose hs.

## 2016-03-09 NOTE — Progress Notes (Signed)
[facility]  Below are qualifications for BiPap:  BiPAP S  Physician has considered and ruled out OSA and treatment with CPAP -AND- ABG (arterial blood gas) with a PaCO2 > 52 performed while the patient is awake and breathing the patients prescribed oxygen setting -AND- Overnight oximetry with oxygen saturations <88% for 5 cumulative minutes while breathing oxygen at 2lpm or the patients prescribed oxygen setting, whichever is higher. (Nocturnal recording time must be at least two hours)  Face to Face documentation, in the form of clinical notes by the treating physician that fully documents characteristics of sleep associated hypoventilation; some examples include hypersomnolence, excessive fatigue, morning headache, cognitive dysfunctions, dyspnea, etc.  BiPAP ST or ASV  Patient is already on BiPAP S and has been on BiPAP S for any length of time prior to delivery of BiPAP ST or ASV -AND- ABG (arterial blood gas) PaCO2, performed while awake and breathing the prescribed oxygen shows that the PaCO2 worsens by 11mHG or greater compared to the original test -AND- Overnight oximetry with oxygen saturations < 88% for 5 cumulative minutes while using the BiPAP S. (Nocturnal oximetry time must be at least 2 hours of study time) and the study should indicate that O2 desaturations are not caused by obstructive upper airway events.  Face to Face documentation, in the form of clinical notes by the treating physician that fully documents characteristics of sleep associated hypoventilation; some examples include hypersomnolence, excessive fatigue, morning headache, cognitive dysfunctions, dyspnea, etc.  JFlorene Glen7/13/2017, 2:02 PM

## 2016-03-09 NOTE — Progress Notes (Signed)
Patient Name: Regina Burke Date of Encounter: 03/09/2016  Patient Care Team: Tracie Harrier, MD as PCP - General (Internal Medicine) Minna Merritts, MD as Consulting Physician (Cardiology)  PROBLEM LIST  Active Problems:   Acute respiratory failure [facility])   Coronary artery disease involving native coronary artery of native heart without angina pectoris   Paroxysmal atrial fibrillation (HCC)   Acute on chronic renal failure (HCC)   Chronic obstructive pulmonary disease with acute exacerbation ([place])   Essential hypertension   Acute on chronic diastolic CHF (congestive heart failure), NYHA class 1 ([place])   Persistent atrial fibrillation ([place])   Acute renal failure ([place])   DNR (do not resuscitate) discussion   Palliative care encounter     SUBJECTIVE Pt tolerated cardioversion well yesterday, sats were > 95% most of the time. CV after 1 dose of 200J biphasic energy. Patient developed respiratory distress post cardioversion. Likely bronchospasm/wheezing. Pt refused xopenex. Eventually had to go on BIPAP. Shortly after noon time, she was noted to convert back to afib.  Currently off bipap. Breathing improved. No CP.   CURRENT MEDS . ALPRAZolam  0.25 mg Oral QHS  . antiseptic oral rinse  7 mL Mouth Rinse q12n4p  . chlorhexidine  15 mL Mouth Rinse BID  . diltiazem  240 mg Oral Daily  . famotidine  20 mg Oral QHS  . fluticasone furoate-vilanterol  1 puff Inhalation Daily  . furosemide  20 mg Intravenous Q12H  . insulin aspart  0-24 Units Subcutaneous Q4H  . levalbuterol  0.63 mg Nebulization Once  . methylPREDNISolone (SOLU-MEDROL) injection  40 mg Intravenous Q24H  . metoprolol tartrate  25 mg Oral Q6H  . ranolazine  500 mg Oral BID  . rivaroxaban  15 mg Oral Q supper  . senna-docusate  2 tablet Oral BID  . sertraline  50 mg Oral Daily  . tiotropium  18 mcg Inhalation Daily    OBJECTIVE  Filed Vitals:   03/09/16 0700 03/09/16 0800 03/09/16 0825 03/09/16 0828  BP:  109/73 78/58  98/70  Pulse: 84 80 91 105  Temp:      TempSrc:      Resp: '12 17 24 17  '$ Height:      Weight:      SpO2: 96% 99% 100% 99%    Intake/Output Summary (Last 24 hours) at 03/09/16 0909 Last data filed at 03/09/16 0600  Gross per 24 hour  Intake  690.7 ml  Output   2145 ml  Net -1454.3 ml   Filed Weights   03/07/16 0519 03/08/16 0600 03/09/16 0442  Weight: 190 lb 0.6 oz (86.2 kg) 193 lb 5.5 oz (87.7 kg) 187 lb 6.3 oz (85 kg)    PHYSICAL EXAM VS:  BP 98/70 mmHg  Pulse 105  Temp(Src) 98.3 F (36.8 C) (Axillary)  Resp 17  Ht '5\' 5"'$  (1.651 m)  Wt 187 lb 6.3 oz (85 kg)  BMI 31.18 kg/m2  SpO2 99% , BMI Body mass index is 31.18 kg/(m^2). GENERAL:  well developed, well nourished, obese, not in acute distress HEENT: normocephalic, pink conjunctivae, anicteric sclerae, no xanthelasma, normal dentition, oropharynx clear NECK:  no appreciable neck vein engorgement, JVP normal, no hepatojugular reflux, carotid upstroke brisk and symmetric, no bruit, no thyromegaly, no lymphadenopathy LUNGS:  good respiratory effort, occasional inspiratory wheezing CV:  PMI not displaced, no thrills, no lifts, irregeular rhythm, S2 within normal limits, no palpable S3 or S4, no murmurs, no rubs, no gallops ABD:  Soft, nontender, nondistended,  normoactive bowel sounds, no abdominal aortic bruit, no hepatomegaly, no splenomegaly MS: nontender back, no kyphosis, no scoliosis, no joint deformities EXT:  2+ DP/PT pulses, no edema, no varicosities, no cyanosis, no clubbing SKIN: warm, nondiaphoretic, normal turgor, no ulcers NEUROPSYCH: alert, oriented to person, place, and time, sensory/motor grossly intact, normal mood, appropriate affect   Accessory Clinical Findings  CBC  Recent Labs  03/07/16 0447 03/09/16 0648  WBC 8.5 4.9  HGB 9.5* 9.6*  HCT 28.2* 28.6*  MCV 80.5 79.8*  PLT 245 381   Basic Metabolic Panel  Recent Labs  03/07/16 0447 03/08/16 0335 03/09/16 0648  NA 137 139 139   K 4.3 4.1 4.2  CL 96* 96* 96*  CO2 30 31 33*  GLUCOSE 115* 108* 135*  BUN 96* 108* 110*  CREATININE 3.08* 2.75* 2.29*  CALCIUM 8.7* 9.0 8.7*  PHOS 6.1*  --  5.8*   Liver Function Tests  Recent Labs  03/07/16 0447 03/09/16 0648  ALBUMIN 3.6 3.5   No results for input(s): LIPASE, AMYLASE in the last 72 hours. Cardiac Enzymes No results for input(s): CKTOTAL, CKMB, CKMBINDEX, TROPONINI in the last 72 hours. BNP (last 3 results)  Recent Labs  01/21/16 1228 03/04/16 2100 03/05/16 0505  BNP 943.0* 1114.0* 1124.0*   D-Dimer No results for input(s): DDIMER in the last 72 hours. Hemoglobin A1C No results for input(s): HGBA1C in the last 72 hours. Fasting Lipid Panel No results for input(s): CHOL, HDL, LDLCALC, TRIG, CHOLHDL, LDLDIRECT in the last 72 hours. Thyroid Function Tests No results for input(s): TSH, T4TOTAL, T3FREE, THYROIDAB in the last 72 hours.  Invalid input(s): FREET3  TELE Afib, VRs 80-105  ECG   RADIOLOGY/STUDIES  Dg Chest 1 View  03/04/2016  CLINICAL DATA:  Respiratory distress EXAM: CHEST 1 VIEW COMPARISON:  Chest radiograph dated 01/24/2016. CT chest dated 01/21/2016. FINDINGS: Patchy bilateral lower lobe opacities, suspicious for pneumonia, less likely atelectasis. Superimposed mild interstitial edema is suspected. Small bilateral pleural effusions. No pneumothorax. Again noted is a stable focal opacity in the medial right upper lobe, most of which likely reflects radiation changes when correlating with prior CT, underlying residual/recurrent tumor not excluded. Cardiomegaly. IMPRESSION: Patchy bilateral lower lobe opacities, suspicious for pneumonia, less likely atelectasis. Superimposed mild interstitial edema is suspected. Small bilateral pleural effusions. Stable focal opacity in the medial right upper lobe, most of which likely reflects radiation changes when correlating with prior CT, underlying residual/recurrent tumor not excluded. Electronically  Signed   By: Julian Hy M.D.   On: 03/04/2016 21:22   Ct Chest Wo Contrast  03/07/2016  CLINICAL DATA:  History of COPD with respiratory distress for the past 3 days EXAM: CT CHEST WITHOUT CONTRAST TECHNIQUE: Multidetector CT imaging of the chest was performed following the standard protocol without IV contrast. COMPARISON:  03/04/2016, 01/21/2016 FINDINGS: Cardiovascular: Calcifications of the thoracic aorta are noted without aneurysmal dilatation. The cardiac structures show coronary calcifications. Mediastinum/Nodes: The thoracic inlet demonstrates some nodularity within the right lobe of the thyroid. This is stable from the prior exam. No significant hilar or mediastinal adenopathy is noted. Lungs/Pleura: Bilateral pleural effusions are seen left slightly greater than right. Some associated lower lobe consolidation is noted again left greater than right. These changes are relatively stable from the prior exam. The ground-glass density seen in the left upper lobe is again identified and has improved somewhat in the interval from the prior exam. Scarring in the right upper lobe is again noted stable from the prior exam  as well. No new focal infiltrate is seen. Upper Abdomen: Stable cystic lesions are again seen in the spleen. Right adrenal lesion is again noted and stable. Musculoskeletal: No acute bony abnormality noted. IMPRESSION: Bilateral pleural effusions and associated lower lobe consolidation stable from the prior study. Stable changes in the right upper lobe similar to that seen on the recent exam. Slight improvement in the ground-glass density within the left upper lobe. Electronically Signed   By: Inez Catalina M.D.   On: 03/07/2016 16:36    ASSESSMENT AND PLAN 1.Persistent atrial fibrillation, RVR s/p CV x 1 with 200J, converted to SR but eventually went back into atrial fibrillation after a few hours During cardioversion procedure, patient tolerated it well. Sedation was done very  carefully and patient kept her O2 sats greater than 95% most the time, on nasal cannula.  Upon waking up, shortly after, the patient slowly developed respiratory distress and it was likely from bronchospasm. Wheezing was noted. Patient refused Xopenex. Eventually needed to be on BiPAP. She did not appear to be in florid volume overload at that time.  It is likely that her pulmonary issues/COPD is playing a major role in her arrhythmia. Likely, efforts to rhythm control her will be futile. Hence, aim for rate control.  Pt has been on Amiodarone PO as outpatient, then gtt since hospital stay. Doubt benefit of keeping her on amiodarone. Her heart rates seemed to have slighty improved today with reinstitution of metoprolol and restarting her Cardizem. Uptitrate doses as needed to keep HR < 100bpm. -Continue renally dose adjusted Xarelto  -Plan for ffup with EP as outpatient  2. CHF, diastolic dysfunction, acute on chronic Improved. Negative I/O. Crea improving. - Continue Lasix '20mg'$  IV q12 for now, consider switch to PO tomorrow, consider '20mg'$  bid.  Check BNP today.  3. HTN -BP on lower side. Asymptomatic. Will d/c amiodarone. Continue current medications   4. CAD without angina -Continue medical therapy -On Xarelto in place of aspirin   5. COPD: -Management per pulmonary team.   6. Acute on CKD stage III: Crea improving. Monitor with diuresis. -Renal on board  I spent at least 35 minutes with the patient today and more than 50% of the time was spent counseling the patient and coordinating care.   Signed, Wende Bushy, MD  03/09/2016, 9:09 AM  [place]

## 2016-03-09 NOTE — Care Management (Signed)
Noted PT recommendations. CSW updated.

## 2016-03-09 NOTE — Progress Notes (Signed)
Patient: Regina Burke / Admit Date: 03/04/2016 / Date of Encounter: 03/09/2016, 9:19 AM   Subjective: Status post briefly successful DCCV on 7/13. Went back into Afib with RVR at 11:27 AM on 7/12 and has remained there since with heart rates in the low 100's to 1-teens bpm. Off BiPAP this morning. Remains on amiodarone infusion.   Review of Systems: ROS  Objective: Telemetry: Currently in Afib with RVR with heart rates in the low 100's bpm. Went back into Afib with RVR at 11:27 AM on 7/12 after her DCCV was briefly successful as above.  Physical Exam: Blood pressure 98/70, pulse 105, temperature 98.3 F (36.8 C), temperature source Axillary, resp. rate 17, height '5\' 5"'$  (1.651 m), weight 187 lb 6.3 oz (85 kg), SpO2 99 %. Body mass index is 31.18 kg/(m^2). General: Well developed, well nourished, in no acute distress. Head: Normocephalic, atraumatic, sclera non-icteric, no xanthomas, nares are without discharge. Neck: Negative for carotid bruits. JVP not elevated. Lungs: Clear bilaterally to auscultation without wheezes, rales, or rhonchi. Breathing is unlabored. Heart: Irregularly-irregular, tachycardic, S1 S2 without murmurs, rubs, or gallops.  Abdomen: Soft, non-tender, non-distended with normoactive bowel sounds. No rebound/guarding. Extremities: No clubbing or cyanosis. No edema. Distal pedal pulses are 2+ and equal bilaterally. Neuro: Alert and oriented X 3. Moves all extremities spontaneously. Psych:  Responds to questions appropriately with a normal affect.   Intake/Output Summary (Last 24 hours) at 03/09/16 0919 Last data filed at 03/09/16 0600  Gross per 24 hour  Intake  690.7 ml  Output   2145 ml  Net -1454.3 ml    Inpatient Medications:  . ALPRAZolam  0.25 mg Oral QHS  . antiseptic oral rinse  7 mL Mouth Rinse q12n4p  . chlorhexidine  15 mL Mouth Rinse BID  . diltiazem  240 mg Oral Daily  . famotidine  20 mg Oral QHS  . fluticasone furoate-vilanterol  1 puff  Inhalation Daily  . furosemide  20 mg Intravenous Q12H  . insulin aspart  0-24 Units Subcutaneous Q4H  . levalbuterol  0.63 mg Nebulization Once  . methylPREDNISolone (SOLU-MEDROL) injection  40 mg Intravenous Q24H  . metoprolol tartrate  25 mg Oral Q6H  . ranolazine  500 mg Oral BID  . rivaroxaban  15 mg Oral Q supper  . senna-docusate  2 tablet Oral BID  . sertraline  50 mg Oral Daily  . tiotropium  18 mcg Inhalation Daily   Infusions:    Labs:  Recent Labs  03/07/16 0447 03/08/16 0335 03/09/16 0648  NA 137 139 139  K 4.3 4.1 4.2  CL 96* 96* 96*  CO2 30 31 33*  GLUCOSE 115* 108* 135*  BUN 96* 108* 110*  CREATININE 3.08* 2.75* 2.29*  CALCIUM 8.7* 9.0 8.7*  PHOS 6.1*  --  5.8*    Recent Labs  03/07/16 0447 03/09/16 0648  ALBUMIN 3.6 3.5    Recent Labs  03/07/16 0447 03/09/16 0648  WBC 8.5 4.9  HGB 9.5* 9.6*  HCT 28.2* 28.6*  MCV 80.5 79.8*  PLT 245 201   No results for input(s): CKTOTAL, CKMB, TROPONINI in the last 72 hours. Invalid input(s): POCBNP No results for input(s): HGBA1C in the last 72 hours.   Weights: Filed Weights   03/07/16 0519 03/08/16 0600 03/09/16 0442  Weight: 190 lb 0.6 oz (86.2 kg) 193 lb 5.5 oz (87.7 kg) 187 lb 6.3 oz (85 kg)     Radiology/Studies:  Dg Chest 1 View  03/04/2016  CLINICAL DATA:  Respiratory distress EXAM: CHEST 1 VIEW COMPARISON:  Chest radiograph dated 01/24/2016. CT chest dated 01/21/2016. FINDINGS: Patchy bilateral lower lobe opacities, suspicious for pneumonia, less likely atelectasis. Superimposed mild interstitial edema is suspected. Small bilateral pleural effusions. No pneumothorax. Again noted is a stable focal opacity in the medial right upper lobe, most of which likely reflects radiation changes when correlating with prior CT, underlying residual/recurrent tumor not excluded. Cardiomegaly. IMPRESSION: Patchy bilateral lower lobe opacities, suspicious for pneumonia, less likely atelectasis. Superimposed mild  interstitial edema is suspected. Small bilateral pleural effusions. Stable focal opacity in the medial right upper lobe, most of which likely reflects radiation changes when correlating with prior CT, underlying residual/recurrent tumor not excluded. Electronically Signed   By: Julian Hy M.D.   On: 03/04/2016 21:22   Ct Chest Wo Contrast  03/07/2016  CLINICAL DATA:  History of COPD with respiratory distress for the past 3 days EXAM: CT CHEST WITHOUT CONTRAST TECHNIQUE: Multidetector CT imaging of the chest was performed following the standard protocol without IV contrast. COMPARISON:  03/04/2016, 01/21/2016 FINDINGS: Cardiovascular: Calcifications of the thoracic aorta are noted without aneurysmal dilatation. The cardiac structures show coronary calcifications. Mediastinum/Nodes: The thoracic inlet demonstrates some nodularity within the right lobe of the thyroid. This is stable from the prior exam. No significant hilar or mediastinal adenopathy is noted. Lungs/Pleura: Bilateral pleural effusions are seen left slightly greater than right. Some associated lower lobe consolidation is noted again left greater than right. These changes are relatively stable from the prior exam. The ground-glass density seen in the left upper lobe is again identified and has improved somewhat in the interval from the prior exam. Scarring in the right upper lobe is again noted stable from the prior exam as well. No new focal infiltrate is seen. Upper Abdomen: Stable cystic lesions are again seen in the spleen. Right adrenal lesion is again noted and stable. Musculoskeletal: No acute bony abnormality noted. IMPRESSION: Bilateral pleural effusions and associated lower lobe consolidation stable from the prior study. Stable changes in the right upper lobe similar to that seen on the recent exam. Slight improvement in the ground-glass density within the left upper lobe. Electronically Signed   By: Inez Catalina M.D.   On: 03/07/2016  16:36     Assessment and Plan  Principal Problem:   Acute respiratory failure ([place]) Active Problems:   Acute on chronic diastolic CHF (congestive heart failure), NYHA class 1 (HCC)   Persistent atrial fibrillation (HCC)   Coronary artery disease involving native coronary artery of native heart without angina pectoris   Acute on chronic renal failure (HCC)   Chronic obstructive pulmonary disease with acute exacerbation ([place])   Essential hypertension   DNR (do not resuscitate) discussion   Palliative care encounter    1. Persistent Afib with RVR: -Briefly successful DCCV on the morning of 7/12 -Went back into Afib with RVR at 11:27 AM on 7/12 and has remained there since -Pursue rate control -Transition from IV amiodarone to PO amiodarone today 200 mg bid x 1 week then 200 mg daily thereafter  -Continue renally dose adjusted Xarelto -Will need EP follow up for possible AV nodal ablation vs PVI  2. Acute on chronic diastolic CHF: -Improved -Change IV Lasix to PO today 20 mg bid -Continue metoprolol 25 mg bid  3. HTN: -Controlled -Continue current medications  4. CAD without angina: -Continue current medical therapy -On Xarelto in place of aspirin   5. COPD: -Continue inhalers -She refuses  Xopenex -Per pulmonary   6. Acute on CKD stage III: -Renal on board -Improving    Signed, Marcille Blanco [place] Pager: [phone] 03/09/2016, 9:19 AM

## 2016-03-09 NOTE — Evaluation (Signed)
Physical Therapy Evaluation Patient Details Name: Regina Burke MRN: [id number] DOB: 10-Nov-1932 Today's Date: 03/09/2016   History of Present Illness  Patient is an 82 y/o female that presents after experiencing a-fib exacerbation and severe shortness of breath at home. Initiated on BiPap, amioderone drip. Has now been weaned to HFNC, amio-PO. She was cardioverted yesterday, unfortunately resumed a-fib within 2 hours.   Clinical Impression  Patient evaluated after presenting with shortness of breath at home. She was just taken off amioderone drip, HR has been stable in the 110s since that time. She was able to complete bed mobility with assistance, min A to complete sit to stand. She does tolerate short bouts of ambulation with RW, maintaining stable O2 and HR. She is quite deconditoned, likely from bed rest due to cardiopulmonary issues. She desperately wants to return home, but is too deconditioned to do so now without 24 hour assistance. PT will continue to progress OOB mobility while patient is admitted.     Follow Up Recommendations SNF (Patient refusing SNF, would prefer to return home. If so will need O2, HHPT, 24/7 assist due to fatigue)    Equipment Recommendations       Recommendations for Other Services       Precautions / Restrictions Precautions Precautions: Fall Restrictions Weight Bearing Restrictions: No      Mobility  Bed Mobility Overal bed mobility: Needs Assistance Bed Mobility: Supine to Sit;Sit to Supine     Supine to sit: Min guard Sit to supine: Mod assist   General bed mobility comments: Patient requires bilateral UEs to complete supine to sit transfer. Sit to supine requires PT assistance to manage lines, leads, tubes.   Transfers Overall transfer level: Needs assistance Equipment used: Rolling walker (2 wheeled) Transfers: Sit to/from Stand Sit to Stand: Min guard;Min assist         General transfer comment: Patient unable to complete  transfer without assistance from PT due to LE weakness.  Ambulation/Gait Ambulation/Gait assistance: Min guard;Min assist Ambulation Distance (Feet): 3 Feet Assistive device: Rolling walker (2 wheeled) Gait Pattern/deviations: Decreased step length - right;Decreased step length - left;Shuffle;Trunk flexed   Gait velocity interpretation: Below normal speed for age/gender General Gait Details: Patient able to take 3 steps forward, 3 steps retro with RW without significant increase HR (110s to high 120s). After sit break, further ambulaiton attempted, please see misc exercises section.   Stairs            Wheelchair Mobility    Modified Rankin (Stroke Patients Only)       Balance Overall balance assessment: Needs assistance Sitting-balance support: Bilateral upper extremity supported Sitting balance-Leahy Scale: Fair     Standing balance support: Bilateral upper extremity supported Standing balance-Leahy Scale: Fair                               Pertinent Vitals/Pain Pain Assessment: No/denies pain    Home Living Family/patient expects to be discharged to:: Private residence Living Arrangements: Children Available Help at Discharge: Family Type of Home: House Home Access: Stairs to enter Entrance Stairs-Rails: Right Entrance Stairs-Number of Steps: 1 step to get onto porch, one step to get into house, one step inside house between rooms. Can hold onto wall for support with each step. Home Layout: One level Home Equipment: Walker - 2 wheels;Cane - single point;Shower seat;Grab bars - tub/shower      Prior Function Level of Independence: Independent  with assistive device(s)         Comments: Patient has been using SPC and RW recently. Decreasing tolerance for OOB mobility due to shortness of breath.      Hand Dominance        Extremity/Trunk Assessment   Upper Extremity Assessment: Generalized weakness           Lower Extremity  Assessment: Generalized weakness         Communication   Communication: No difficulties  Cognition Arousal/Alertness: Awake/alert Behavior During Therapy: WFL for tasks assessed/performed Overall Cognitive Status: Within Functional Limits for tasks assessed                      General Comments      Exercises Other Exercises Other Exercises: 10 LAQs at EOB to assess HR (HR increased to 120s) decreased with rest  Other Exercises: Patient agreed to ambulate around bed ~ 10-15' with RW. HR maintained in 110s, O2 sats on 4L stable around 88-93%. Short shuffling steps noted, educated to increase step length.       Assessment/Plan    PT Assessment Patient needs continued PT services  PT Diagnosis Difficulty walking;Generalized weakness   PT Problem List Decreased strength;Decreased activity tolerance;Cardiopulmonary status limiting activity;Decreased balance;Decreased mobility  PT Treatment Interventions DME instruction;Gait training;Stair training;Therapeutic activities;Therapeutic exercise;Balance training   PT Goals (Current goals can be found in the Care Plan section) Acute Rehab PT Goals Patient Stated Goal: Patient would strongly desire to return home.  PT Goal Formulation: With patient Time For Goal Achievement: 03/23/16 Potential to Achieve Goals: Good    Frequency Min 2X/week   Barriers to discharge        Co-evaluation               End of Session Equipment Utilized During Treatment: Gait belt;Oxygen Activity Tolerance: Patient tolerated treatment well;Patient limited by fatigue Patient left: in bed;with call bell/phone within reach;with family/visitor present Nurse Communication: Mobility status         Time: 1223-1250 PT Time Calculation (min) (ACUTE ONLY): 27 min   Charges:   PT Evaluation $PT Eval High Complexity: 1 Procedure PT Treatments $Gait Training: 8-22 mins   PT G Codes:       Kerman Passey, PT, DPT    03/09/2016,  1:34 PM

## 2016-03-09 NOTE — Discharge Instructions (Signed)
Heart Failure Clinic appointment on March 22, 2016 at 12:30pm with Darylene Price, [place]. Please call [phone] to reschedule.

## 2016-03-10 ENCOUNTER — Inpatient Hospital Stay: Payer: Medicare Other

## 2016-03-10 DIAGNOSIS — J9600 Acute respiratory failure, unspecified whether with hypoxia or hypercapnia: Secondary | ICD-10-CM

## 2016-03-10 LAB — BASIC METABOLIC PANEL
Anion gap: 8 (ref 5–15)
BUN: 106 mg/dL — AB (ref 6–20)
CHLORIDE: 97 mmol/L — AB (ref 101–111)
CO2: 36 mmol/L — AB (ref 22–32)
CREATININE: 2.1 mg/dL — AB (ref 0.44–1.00)
Calcium: 8.9 mg/dL (ref 8.9–10.3)
GFR calc Af Amer: 24 mL/min — ABNORMAL LOW (ref >60)
GFR calc non Af Amer: 21 mL/min — ABNORMAL LOW (ref >60)
GLUCOSE: 115 mg/dL — AB (ref 65–99)
POTASSIUM: 3.9 mmol/L (ref 3.5–5.1)
Sodium: 141 mmol/L (ref 135–145)

## 2016-03-10 LAB — CBC
HEMATOCRIT: 28.7 % — AB (ref 35.0–47.0)
HEMOGLOBIN: 9.8 g/dL — AB (ref 12.0–16.0)
MCH: 27.3 pg (ref 26.0–34.0)
MCHC: 34.3 g/dL (ref 32.0–36.0)
MCV: 79.5 fL — AB (ref 80.0–100.0)
Platelets: 206 10*3/uL (ref 150–440)
RBC: 3.6 MIL/uL — AB (ref 3.80–5.20)
RDW: 16.4 % — ABNORMAL HIGH (ref 11.5–14.5)
WBC: 4.9 10*3/uL (ref 3.6–11.0)

## 2016-03-10 LAB — BLOOD GAS, ARTERIAL
Acid-Base Excess: 8.9 mmol/L — ABNORMAL HIGH (ref 0.0–3.0)
Bicarbonate: 34.5 mEq/L — ABNORMAL HIGH (ref 21.0–28.0)
FIO2: 32
O2 SAT: 97.1 %
PATIENT TEMPERATURE: 37
PO2 ART: 89 mmHg (ref 83.0–108.0)
pCO2 arterial: 52 mmHg — ABNORMAL HIGH (ref 32.0–48.0)
pH, Arterial: 7.43 (ref 7.350–7.450)

## 2016-03-10 LAB — GLUCOSE, CAPILLARY
GLUCOSE-CAPILLARY: 171 mg/dL — AB (ref 65–99)
GLUCOSE-CAPILLARY: 263 mg/dL — AB (ref 65–99)
GLUCOSE-CAPILLARY: 176 mg/dL — AB (ref 65–99)
Glucose-Capillary: 108 mg/dL — ABNORMAL HIGH (ref 65–99)
Glucose-Capillary: 227 mg/dL — ABNORMAL HIGH (ref 65–99)

## 2016-03-10 LAB — PHOSPHORUS: Phosphorus: 3.4 mg/dL (ref 2.5–4.6)

## 2016-03-10 LAB — MAGNESIUM: Magnesium: 2.4 mg/dL (ref 1.7–2.4)

## 2016-03-10 LAB — PROTIME-INR
INR: 1.71
PROTHROMBIN TIME: 20.1 s — AB (ref 11.4–15.0)

## 2016-03-10 MED ORDER — SENNOSIDES-DOCUSATE SODIUM 8.6-50 MG PO TABS
1.0000 | ORAL_TABLET | Freq: Two times a day (BID) | ORAL | Status: DC
  Administered 2016-03-11 (×2): 1 via ORAL
  Filled 2016-03-10 (×4): qty 1

## 2016-03-10 MED ORDER — METOPROLOL TARTRATE 50 MG PO TABS
50.0000 mg | ORAL_TABLET | Freq: Two times a day (BID) | ORAL | Status: DC
  Administered 2016-03-10 – 2016-03-14 (×8): 50 mg via ORAL
  Filled 2016-03-10 (×8): qty 1

## 2016-03-10 NOTE — Progress Notes (Signed)
[facility] for constipation management    Allergies  Allergen Reactions  . Ciprofloxacin Shortness Of Breath, Itching and Rash  . Doxycycline Shortness Of Breath, Itching and Rash  . Penicillins Shortness Of Breath, Itching, Rash and Other (See Comments)    Has patient had a PCN reaction causing immediate rash, facial/tongue/throat swelling, SOB or lightheadedness with hypotension: Yes Has patient had a PCN reaction causing severe rash involving mucus membranes or skin necrosis: No Has patient had a PCN reaction that required hospitalization No Has patient had a PCN reaction occurring within the last 10 years: No If all of the above answers are "NO", then may proceed with Cephalosporin use.  . Sulfa Antibiotics Shortness Of Breath, Itching and Rash  . Morphine And Related Itching  . Albuterol Sulfate   . Cefuroxime Rash    blisters    Patient Measurements: Height: '5\' 5"'$  (165.1 cm) Weight: 185 lb 6.5 oz (84.1 kg) IBW/kg (Calculated) : 57  Vital Signs: Temp: 97.9 F (36.6 C) (07/14 0700) Temp Source: Axillary (07/14 0600) BP: 109/58 mmHg (07/14 1000) Pulse Rate: 98 (07/14 1000) Intake/Output from previous day: 07/13 0701 - 07/14 0700 In: 450 [P.O.:450] Out: 2050 [Urine:2050] Intake/Output from this shift: Total I/O In: -  Out: 400 [Urine:400]  Labs:  Recent Labs  03/08/16 0335 03/09/16 0648 03/10/16 0526  WBC  --  4.9 4.9  HGB  --  9.6* 9.8*  HCT  --  28.6* 28.7*  PLT  --  201 206  CREATININE 2.75* 2.29* 2.10*  MG  --   --  2.4  PHOS  --  5.8* 3.4  ALBUMIN  --  3.5  --    Estimated Creatinine Clearance: 22.9 mL/min (by C-G formula based on Cr of 2.1).   Assessment: Pharmacy consulted for constipation management for 82 yo female ICU patient. Patient has not had documented bowel movement this admission. Patient ordered senna/docusate 2 tabs BID starting 7/12 and received bisacodyl suppository x 1 on 7/13.    Plan:   Continue senna/docusate 1 tab PO BID.    Will assess for bowel movement in am.   Pharmacy will continue to monitor and adjust per consult.   Simpson,Michael L 03/10/2016,11:46 AM

## 2016-03-10 NOTE — Care Management (Signed)
Spoke with son. He prefers to take patient home. [facility]ants to use [facility]. Referral to [facility] for SN, HHA and PT. Also requested bipap, from [facility]. MD directed to guidelines in the chart for qualifying. It is anticipated that patient may discharge to home over the weekend.

## 2016-03-10 NOTE — Progress Notes (Signed)
[facility]  ROUNDING NOTE   Subjective:   UOP 2050 (2145) Creatinine 2.1 (2.29)  Furosemide '20mg'$  IV q12  Sitting in chair.   Objective:  Vital signs in last 24 hours:  Temp:  [97.5 F (36.4 C)-98.2 F (36.8 C)] 97.9 F (36.6 C) (07/14 0700) Pulse Rate:  [57-107] 93 (07/14 0700) Resp:  [11-22] 15 (07/14 0700) BP: (86-121)/(55-81) 116/62 mmHg (07/14 0700) SpO2:  [95 %-100 %] 98 % (07/14 0700) Weight:  [84.1 kg (185 lb 6.5 oz)] 84.1 kg (185 lb 6.5 oz) (07/14 0335)  Weight change: -0.9 kg (-1 lb 15.8 oz) Filed Weights   03/08/16 0600 03/09/16 0442 03/10/16 0335  Weight: 87.7 kg (193 lb 5.5 oz) 85 kg (187 lb 6.3 oz) 84.1 kg (185 lb 6.5 oz)    Intake/Output: I/O last 3 completed shifts: In: 629 [P.O.:770; I.V.:187] Out: 3270 [Urine:3270]   Intake/Output this shift:     Physical Exam: General: Sitting up in bed  Head: [place]/AT  Eyes: Anicteric, PERRL  Neck: Supple, trachea midline  Lungs:  Clear bilaterally, 4L [place]  Heart: irregular  Abdomen:  Soft, nontender  Extremities:  no peripheral edema.  Neurologic: Nonfocal, moving all four extremities  Skin: No lesions       Basic Metabolic Panel:  Recent Labs Lab 03/05/16 0505 03/06/16 0433 03/07/16 0447 03/08/16 0335 03/09/16 0648 03/10/16 0526  NA 139 138 137 139 139 141  K 4.1 4.7 4.3 4.1 4.2 3.9  CL 103 100* 96* 96* 96* 97*  CO2 '27 27 30 31 '$ 33* 36*  GLUCOSE 150* 124* 115* 108* 135* 115*  BUN 86* 92* 96* 108* 110* 106*  CREATININE 2.86* 3.01* 3.08* 2.75* 2.29* 2.10*  CALCIUM 8.1* 8.6* 8.7* 9.0 8.7* 8.9  MG 2.4  --   --   --   --  2.4  PHOS 5.3*  --  6.1*  --  5.8* 3.4    Liver Function Tests:  Recent Labs Lab 03/07/16 0447 03/09/16 0648  ALBUMIN 3.6 3.5   No results for input(s): LIPASE, AMYLASE in the last 168 hours. No results for input(s): AMMONIA in the last 168 hours.  CBC:  Recent Labs Lab 03/04/16 2100 03/05/16 0505 03/07/16 0447 03/09/16 0648 03/10/16 0526  WBC  15.4* 15.5* 8.5 4.9 4.9  NEUTROABS 12.1*  --   --   --   --   HGB 11.1* 10.9* 9.5* 9.6* 9.8*  HCT 33.6* 32.6* 28.2* 28.6* 28.7*  MCV 80.7 80.9 80.5 79.8* 79.5*  PLT 482* 446* 245 201 206    Cardiac Enzymes:  Recent Labs Lab 03/04/16 2100  TROPONINI <0.03    BNP: Invalid input(s): POCBNP  CBG:  Recent Labs Lab 03/09/16 1626 03/09/16 1948 03/09/16 2350 03/10/16 0335 03/10/16 0728  GLUCAP 186* 189* 126* 108* 171*    Microbiology: Results for orders placed or performed during the hospital encounter of 03/04/16  Blood Culture (routine x 2)     Status: None   Collection Time: 03/04/16  9:40 PM  Result Value Ref Range Status   Specimen Description BLOOD LEFT HAND  Final   Special Requests   Final    BOTTLES DRAWN AEROBIC AND ANAEROBIC 11CCAERO,12CCANA   Culture NO GROWTH 5 DAYS  Final   Report Status 03/09/2016 FINAL  Final  Blood Culture (routine x 2)     Status: None   Collection Time: 03/04/16  9:40 PM  Result Value Ref Range Status   Specimen Description BLOOD RIGHT HAND  Final  Special Requests   Final    BOTTLES DRAWN AEROBIC AND ANAEROBIC [place]   Culture NO GROWTH 5 DAYS  Final   Report Status 03/09/2016 FINAL  Final  MRSA PCR Screening     Status: None   Collection Time: 03/05/16 12:30 AM  Result Value Ref Range Status   MRSA by PCR NEGATIVE NEGATIVE Final    Comment:        The GeneXpert MRSA Assay (FDA approved for NASAL specimens only), is one component of a comprehensive MRSA colonization surveillance program. It is not intended to diagnose MRSA infection nor to guide or monitor treatment for MRSA infections.   Urine culture     Status: None   Collection Time: 03/05/16 10:33 AM  Result Value Ref Range Status   Specimen Description URINE, RANDOM  Final   Special Requests NONE  Final   Culture NO GROWTH Performed at [facility]   Final   Report Status 03/06/2016 FINAL  Final    Coagulation Studies:  Recent Labs   03/10/16 0526  LABPROT 20.1*  INR 1.71    Urinalysis: No results for input(s): COLORURINE, LABSPEC, PHURINE, GLUCOSEU, HGBUR, BILIRUBINUR, KETONESUR, PROTEINUR, UROBILINOGEN, NITRITE, LEUKOCYTESUR in the last 72 hours.  Invalid input(s): APPERANCEUR    Imaging: Dg Chest Port 1 View  03/10/2016  CLINICAL DATA:  Acute respiratory failure, asthma -COPD, CHF, coronary artery disease EXAM: PORTABLE CHEST 1 VIEW COMPARISON:  Portable chest x-ray of March 04, 2016 and chest CT scan of March 07, 2016 FINDINGS: The lungs are well-expanded. There remain bilateral pleural effusions which have increased slightly in volume. The pulmonary vascularity is less engorged. The interstitial markings are less prominent. The cardiac silhouette remains enlarged. There is calcification in the wall of the aortic arch. IMPRESSION: Mild interval improvement in pulmonary interstitial edema and bilateral pleural effusions. Persistent left basilar atelectasis or pneumonia. Electronically Signed   By: David  Martinique M.D.   On: 03/10/2016 07:51     Medications:     . ALPRAZolam  0.25 mg Oral QHS  . antiseptic oral rinse  7 mL Mouth Rinse q12n4p  . chlorhexidine  15 mL Mouth Rinse BID  . diltiazem  240 mg Oral Daily  . famotidine  20 mg Oral QHS  . fluticasone furoate-vilanterol  1 puff Inhalation Daily  . insulin aspart  0-24 Units Subcutaneous Q4H  . levalbuterol  0.63 mg Nebulization Once  . metoprolol tartrate  25 mg Oral Q6H  . ranolazine  500 mg Oral BID  . rivaroxaban  15 mg Oral Q supper  . senna-docusate  2 tablet Oral BID  . sertraline  50 mg Oral Daily  . tiotropium  18 mcg Inhalation Daily   metoprolol, ondansetron (ZOFRAN) IV  Assessment/ Plan:  [name] is a 82 y.o. white female with diabetes mellitus type II, coronary artery disease, atrial fibrillation, hypertension, hyperlipidemia, generalized anxiety disorder, gout, anemia, history of lung cancer, COPD, atrial fibrillation, fatty liver  disease, GERD, osteoporosis   1. Acute Renal Failure on Chronic kidney disease stage III with history of hyperkalemia and proteinuria: Baseline creatinine of 1.5, eGFR of 32.  Currently off ACE-I/ARB  due to hyperkalemia.   Chronic Kidney disease secondary to diabetes and hypertension.  - Monitor potassium level. No acute indication for dialysis. Continue to monitor urine output and volume status.  - Hold fursoemide. Nonoliguric urine output.   2. Hypertension, atrial fibrillation with rapid ventricular response status post cardioversion. Placed on amiodarone gtt -  Continue diltiazem and metoprolol for rate control.  - hold furosemide today.   3. Anemia with chronic kidney disease: Hemoglobin stable.    LOS: [place], [place] 7/14/20179:15 AM

## 2016-03-10 NOTE — Progress Notes (Signed)
Patient sat out of bed for most of day, up several times with walker to [facility], foley removed early am and pt has voided 350 mls since. 1 BM, soft, 1 episode of nausea treated with IV medication with good effect. Multiple visitors, questions answered as asked. VSS , has remained on 02 via [place] all day

## 2016-03-10 NOTE — Progress Notes (Signed)
PT Cancellation Note  Patient Details Name: Regina Burke MRN: [id number] DOB: [birth year]   Cancelled Treatment:    Reason Eval/Treat Not Completed: Other (comment) (Treatment session attempted.  Primary RN reports patient has been OOB to chair since 0800 and has just returned to bed.  Requests therapist hold for now due to patient fatigue and re-attempt at later time as able.  Will continue efforts as appropriate.)   [name] H. Owens Shark, PT, DPT, NCS 03/10/2016, 3:03 PM [phone]

## 2016-03-10 NOTE — Progress Notes (Signed)
PULMONARY / CRITICAL CARE MEDICINE   Name: Regina Burke MRN: [id number] DOB: September 08, 1932    ADMISSION DATE:  03/04/2016 CONSULTATION DATE:  03/04/16  REFERRING MD: EDP  CHIEF COMPLAINT:Acute shortness of breath  DISCUSSION  82 year old female with multiple comorbidities such as COPD, CHF, chronic kidney disease, hypertension, sleep apnea now presenting with bilateral lower lobe opacities likely related to acute CHF with uncontrolled Afib. Patient was cardioverted on 7/12. SR currently. Patient is off and on BiPAP.   SUBJECTIVE: Currently off bipap on 3L O2 via nasal canula denies any acute issues.   No current facility-administered medications on file prior to encounter.   Current Outpatient Prescriptions on File Prior to Encounter  Medication Sig  . ALPRAZolam (XANAX) 0.25 MG tablet Take 0.25 mg by mouth 2 (two) times daily as needed for anxiety.  Marland Kitchen amiodarone (PACERONE) 400 MG tablet Take 1 tablet (400 mg total) by mouth 2 (two) times daily.  Marland Kitchen atorvastatin (LIPITOR) 20 MG tablet Take 1 tablet (20 mg total) by mouth every evening.  . cholecalciferol (VITAMIN D) 400 UNITS TABS tablet Take 800 Units by mouth every evening.   . cyanocobalamin 500 MCG tablet Take 500 mcg by mouth every evening.  . diltiazem (CARDIZEM CD) 240 MG 24 hr capsule Take 1 capsule (240 mg total) by mouth every evening.  . febuxostat (ULORIC) 40 MG tablet Take 40 mg by mouth every evening.   . fenofibrate (TRICOR) 48 MG tablet Take 48 mg by mouth every evening.   . ferrous sulfate 325 (65 FE) MG EC tablet Take 325 mg by mouth every evening.   . fluticasone furoate-vilanterol (BREO ELLIPTA) 100-25 MCG/INH AEPB Inhale 1 puff into the lungs daily.  . furosemide (LASIX) 20 MG tablet Take 20 mg by mouth daily.   . metoprolol succinate (TOPROL XL) 25 MG 24 hr tablet Take 50 mg by mouth 2 (two) times daily.   . nitroGLYCERIN (NITROSTAT) 0.4 MG SL tablet Place 1 tablet (0.4 mg total) under the tongue every 5 (five)  minutes as needed for chest pain.  . pantoprazole (PROTONIX) 40 MG tablet Take 40 mg by mouth every evening.   . Rivaroxaban (XARELTO) 15 MG TABS tablet Take 1 tablet (15 mg total) by mouth daily with supper.  . sertraline (ZOLOFT) 50 MG tablet Take 50 mg by mouth every evening.   . tiotropium (SPIRIVA) 18 MCG inhalation capsule Place 18 mcg into inhaler and inhale daily.   REVIEW OF SYSTEMS: Positives in BOLD Gen:  fever, chills, weight change, fatigue, night sweats HEENT: Denies blurred vision, double vision, hearing loss, tinnitus, sinus congestion, rhinorrhea, sore throat, neck stiffness, dysphagia PULM:  shortness of breath, cough, sputum production, hemoptysis, wheezing CV: Denies chest pain, edema, orthopnea, paroxysmal nocturnal dyspnea, palpitations GI: Denies abdominal pain, nausea, vomiting, diarrhea, hematochezia, melena, constipation, change in bowel habits GU: Denies dysuria, hematuria, polyuria, oliguria, urethral discharge Endocrine: Denies hot or cold intolerance, polyuria, polyphagia or appetite change Derm: Denies rash, dry skin, scaling or peeling skin change Heme: Denies easy bruising, bleeding, bleeding gums Neuro: Denies headache, numbness, weakness, slurred speech, loss of memory or consciousness   VITAL SIGNS: BP 116/62 mmHg  Pulse 93  Temp(Src) 97.9 F (36.6 C) (Axillary)  Resp 15  Ht '5\' 5"'$  (1.651 m)  Wt 185 lb 6.5 oz (84.1 kg)  BMI 30.85 kg/m2  SpO2 98%      INTAKE / OUTPUT: I/O last 3 completed shifts: In: 782 [P.O.:770; I.V.:187] Out: 3270 [Urine:3270]  PHYSICAL EXAMINATION: General:Elderly  white female no acute distress Neuro:  Alert and oriented, follows commands  HEENT: Atraumatic, normocephalic, supple, no JVD Cardiovascular: irregular irregular, no M/R/G  Lungs:  Clear bilaterally, symmetric chest expansion, no wheezes crackles rhonchi noted Abdomen:  Soft, nontender, active bowel sounds Musculoskeletal:  Normal bulk and tone Skin:   Grossly intact  LABS:  BMET  Recent Labs Lab 03/08/16 0335 03/09/16 0648 03/10/16 0526  NA 139 139 141  K 4.1 4.2 3.9  CL 96* 96* 97*  CO2 31 33* 36*  BUN 108* 110* 106*  CREATININE 2.75* 2.29* 2.10*  GLUCOSE 108* 135* 115*    Electrolytes  Recent Labs Lab 03/05/16 0505  03/07/16 0447 03/08/16 0335 03/09/16 0648 03/10/16 0526  CALCIUM 8.1*  < > 8.7* 9.0 8.7* 8.9  MG 2.4  --   --   --   --  2.4  PHOS 5.3*  --  6.1*  --  5.8* 3.4  < > = values in this interval not displayed.  CBC  Recent Labs Lab 03/07/16 0447 03/09/16 0648 03/10/16 0526  WBC 8.5 4.9 4.9  HGB 9.5* 9.6* 9.8*  HCT 28.2* 28.6* 28.7*  PLT 245 201 206    Coag's  Recent Labs Lab 03/05/16 0722 03/10/16 0526  INR 1.51 1.71    Sepsis Markers  Recent Labs Lab 03/04/16 2100 03/04/16 2140 03/05/16 0040 03/05/16 0505 03/06/16 0433  LATICACIDVEN  --  0.9 1.0  --   --   PROCALCITON <0.10  --   --  0.26 0.63    ABG  Recent Labs Lab 03/04/16 2111 03/05/16 0454  PHART 7.23* 7.30*  PCO2ART 80* 63*  PO2ART 83 111*    Liver Enzymes  Recent Labs Lab 03/07/16 0447 03/09/16 0648  ALBUMIN 3.6 3.5    Cardiac Enzymes  Recent Labs Lab 03/04/16 2100  TROPONINI <0.03    Glucose  Recent Labs Lab 03/09/16 1129 03/09/16 1626 03/09/16 1948 03/09/16 2350 03/10/16 0335 03/10/16 0728  GLUCAP 254* 186* 189* 126* 108* 171*    Imaging No results found.   STUDIES: Ct of chest 7/11>>Bilateral pleural effusions and associated lower lobe consolidation stable from the prior study. Stable changes in the right upper lobe similar to that seen on the recent exam. Slight improvement in the ground-glass density within the left upper lobe.  CULTURES: 7/8 Blood cultures >>negative 7/13 7/18 Urine culture >>negative 7/10 7/18 Sputum culture >>  ANTIBIOTICS: 7/8 vancomycin>>7/10 7/8 azactam>>7/10   LINES/TUBES: None  ASSESSMENT / PLAN: 82 year old female with multiple  comorbidities such as COPD, CHF, chronic kidney disease, hypertension, sleep apnea now presenting with bilateral lower lobe opacities likely related to acute CHF with uncontrolled Afib  PULMONARY A:  Acute on chronic respiratory failure History of sleep apnea  Hx of right upper lobe cancer  P:   Continue BiPAP as needed Supplemental O2 to maintain O2 sats 88% or above or for dyspnea ABG and CXR as needed Continue Bronchodilators Taper steroids  CARDIOVASCULAR A:  Shock possible caridiogenic Hypotension-resolved History of hypertension Atrial fibrillation- cardioverted on 03/08/16 History of Hyperlipidemia History of chronic diastolic CHF  P:  Keep MAP goals>65 Cardiology consulted appreciate input Continue po Cardizem and Lasix  RENAL A:   Acute on chronic kidney disease P:  Continue renal diet with 1200 ml fluid restriction Replace electrolytes per ICU protocol Follow chemistry  Monitor UOP  GASTROINTESTINAL A:   History of GERD P:  Pepcid for GERD  HEMATOLOGIC A:   Anemia P:  Monitor for s/sx  of bleeding Transfuse if Hgb< 7 Lovenox for VTE prophylaxis  INFECTIOUS No acute issues P:  Trend WBC and monitor fever curve  ENDOCRINE A:   Diabetes Melitus Type II P:   CBG's q4hrs SSI coverage  NEUROLOGIC A:   Hx of depression P:   RASS goal:0 Continue outpatient zoloft and xanax  Note: Plans for meeting with care management and pts son [name] per RN on 7/14 for discussion of placement upon discharge.  Pt will need continuous oximetry to qualify for bipap at home qhs will perform tonight 7/14  Marda Stalker, [place] Pager [phone] (please enter 7 digits) [place] Pager [phone] (please enter 7 digits)   STAFF NOTE: I, Dr. Corrin Parker, have personally reviewed patient's available data, including medical history, events of note, physical examination and test results as part of my evaluation. I have discussed  with NP and other care providers such as pharmacist, RN and RRT.   [facility] [place] per NP whose note is outlined above and that I agree with.   The Patient requires high complexity decision making for assessment and support, frequent evaluation and titration of therapies, application of advanced monitoring technologies and extensive interpretation of multiple databases.  prognosis is guarded.   Patient with Multiorgan dysfunction. Prognosis is poor. Patient is limited code   Corrin Parker, M.D.  [facility]  Medical Director [place] Director [facility] Cardio-Pulmonary Department

## 2016-03-10 NOTE — Care Management (Signed)
Son cancelled appointment with Cm. He would like to see how his mother does over the weekend and follow up on Monday

## 2016-03-10 NOTE — Progress Notes (Signed)
Patient: Regina Burke / Admit Date: 03/04/2016 / Date of Encounter: 03/10/2016, 7:35 AM   Subjective: SOB improved this morning. Remains in rate-controlled Afib with heart rates in the 80's to low 100's bpm. Currently on nasal cannula and off BiPAP. No chest pain.   Review of Systems: Review of Systems  Constitutional: Positive for weight loss and malaise/fatigue. Negative for fever, chills and diaphoresis.  HENT: Negative for congestion.   Eyes: Negative for discharge and redness.  Respiratory: Positive for shortness of breath and wheezing. Negative for cough, hemoptysis and sputum production.        Improved SOB  Cardiovascular: Positive for palpitations. Negative for chest pain, claudication, leg swelling and PND.  Gastrointestinal: Negative for nausea and vomiting.  Musculoskeletal: Negative for falls.  Skin: Negative for rash.  Neurological: Positive for weakness. Negative for dizziness, sensory change, speech change, focal weakness and loss of consciousness.  Endo/Heme/Allergies: Does not bruise/bleed easily.  Psychiatric/Behavioral: The patient is not nervous/anxious.   All other systems reviewed and are negative.   Objective: Telemetry: Afib, 80's to low 100's bpm Physical Exam: Blood pressure 116/62, pulse 93, temperature 97.9 F (36.6 C), temperature source Axillary, resp. rate 15, height '5\' 5"'$  (1.651 m), weight 185 lb 6.5 oz (84.1 kg), SpO2 98 %. Body mass index is 30.85 kg/(m^2). General: Well developed, well nourished, in no acute distress. Head: Normocephalic, atraumatic, sclera non-icteric, no xanthomas, nares are without discharge. Neck: Negative for carotid bruits. JVP not elevated. Lungs: Faint crackles along the bilateral bases. Breathing is unlabored. Heart: Irregularly-irregular S1 S2 without murmurs, rubs, or gallops.  Abdomen: Soft, non-tender, non-distended with normoactive bowel sounds. No rebound/guarding. Extremities: No clubbing or cyanosis. No  edema. Distal pedal pulses are 2+ and equal bilaterally. Neuro: Alert and oriented X 3. Moves all extremities spontaneously. Psych:  Responds to questions appropriately with a normal affect.   Intake/Output Summary (Last 24 hours) at 03/10/16 0735 Last data filed at 03/10/16 0617  Gross per 24 hour  Intake    450 ml  Output   2050 ml  Net  -1600 ml    Inpatient Medications:  . ALPRAZolam  0.25 mg Oral QHS  . antiseptic oral rinse  7 mL Mouth Rinse q12n4p  . chlorhexidine  15 mL Mouth Rinse BID  . diltiazem  240 mg Oral Daily  . famotidine  20 mg Oral QHS  . fluticasone furoate-vilanterol  1 puff Inhalation Daily  . furosemide  20 mg Oral BID  . insulin aspart  0-24 Units Subcutaneous Q4H  . levalbuterol  0.63 mg Nebulization Once  . metoprolol tartrate  25 mg Oral Q6H  . ranolazine  500 mg Oral BID  . rivaroxaban  15 mg Oral Q supper  . senna-docusate  2 tablet Oral BID  . sertraline  50 mg Oral Daily  . tiotropium  18 mcg Inhalation Daily   Infusions:    Labs:  Recent Labs  03/09/16 0648 03/10/16 0526  NA 139 141  K 4.2 3.9  CL 96* 97*  CO2 33* 36*  GLUCOSE 135* 115*  BUN 110* 106*  CREATININE 2.29* 2.10*  CALCIUM 8.7* 8.9  MG  --  2.4  PHOS 5.8* 3.4    Recent Labs  03/09/16 0648  ALBUMIN 3.5    Recent Labs  03/09/16 0648 03/10/16 0526  WBC 4.9 4.9  HGB 9.6* 9.8*  HCT 28.6* 28.7*  MCV 79.8* 79.5*  PLT 201 206   No results for input(s): CKTOTAL, CKMB,  TROPONINI in the last 72 hours. Invalid input(s): POCBNP No results for input(s): HGBA1C in the last 72 hours.   Weights: Filed Weights   03/08/16 0600 03/09/16 0442 03/10/16 0335  Weight: 193 lb 5.5 oz (87.7 kg) 187 lb 6.3 oz (85 kg) 185 lb 6.5 oz (84.1 kg)     Radiology/Studies:  Dg Chest 1 View  03/04/2016  CLINICAL DATA:  Respiratory distress EXAM: CHEST 1 VIEW COMPARISON:  Chest radiograph dated 01/24/2016. CT chest dated 01/21/2016. FINDINGS: Patchy bilateral lower lobe opacities,  suspicious for pneumonia, less likely atelectasis. Superimposed mild interstitial edema is suspected. Small bilateral pleural effusions. No pneumothorax. Again noted is a stable focal opacity in the medial right upper lobe, most of which likely reflects radiation changes when correlating with prior CT, underlying residual/recurrent tumor not excluded. Cardiomegaly. IMPRESSION: Patchy bilateral lower lobe opacities, suspicious for pneumonia, less likely atelectasis. Superimposed mild interstitial edema is suspected. Small bilateral pleural effusions. Stable focal opacity in the medial right upper lobe, most of which likely reflects radiation changes when correlating with prior CT, underlying residual/recurrent tumor not excluded. Electronically Signed   By: Julian Hy M.D.   On: 03/04/2016 21:22   Ct Chest Wo Contrast  03/07/2016  CLINICAL DATA:  History of COPD with respiratory distress for the past 3 days EXAM: CT CHEST WITHOUT CONTRAST TECHNIQUE: Multidetector CT imaging of the chest was performed following the standard protocol without IV contrast. COMPARISON:  03/04/2016, 01/21/2016 FINDINGS: Cardiovascular: Calcifications of the thoracic aorta are noted without aneurysmal dilatation. The cardiac structures show coronary calcifications. Mediastinum/Nodes: The thoracic inlet demonstrates some nodularity within the right lobe of the thyroid. This is stable from the prior exam. No significant hilar or mediastinal adenopathy is noted. Lungs/Pleura: Bilateral pleural effusions are seen left slightly greater than right. Some associated lower lobe consolidation is noted again left greater than right. These changes are relatively stable from the prior exam. The ground-glass density seen in the left upper lobe is again identified and has improved somewhat in the interval from the prior exam. Scarring in the right upper lobe is again noted stable from the prior exam as well. No new focal infiltrate is seen.  Upper Abdomen: Stable cystic lesions are again seen in the spleen. Right adrenal lesion is again noted and stable. Musculoskeletal: No acute bony abnormality noted. IMPRESSION: Bilateral pleural effusions and associated lower lobe consolidation stable from the prior study. Stable changes in the right upper lobe similar to that seen on the recent exam. Slight improvement in the ground-glass density within the left upper lobe. Electronically Signed   By: Inez Catalina M.D.   On: 03/07/2016 16:36     Assessment and Plan  Principal Problem:   Acute respiratory failure ([place]) Active Problems:   Acute on chronic diastolic CHF (congestive heart failure), NYHA class 1 (HCC)   Persistent atrial fibrillation (HCC)   Coronary artery disease involving native coronary artery of native heart without angina pectoris   Acute on chronic renal failure (HCC)   Chronic obstructive pulmonary disease with acute exacerbation ([place])   Essential hypertension   DNR (do not resuscitate) discussion   Palliative care encounter    1. Persistent Afib with RVR: -Status post DCCV x 1 with 200 J on 7/12 briefly converting to NSR, though went back into Afib with RVR at 11:30 AM on 6/43 -Complicated with respiratory distress felt to be 2/2 bronchospasm. She refused Xopenex and was placed on BiPAP. She did not appear to be volume overloaded -  Her pulmonary issues and COPD are likely playing a large role in her arrhythmia  -Efforts to control her arrhythmia will be futile. Aim for rate control -Amiodarone did not seem to be working, thus this was discontinued on 7/13 -Up titrate rate controlling medications as needed and able (BP currently precludes further titration) -Consolidate Lopressor to 50 mg bid from 25 mg q 6 hours -Continue Cardizem CD 240 mg daily -Continue renally dose adjusted Xarelto -She will follow up with EP as an outpatient for AV nodal ablation with implantation of PPM in several weeks once her breathing is  back to baseline   2. Acute on chronic diastolic CHF: -Improved -Lasix to po at 20 mg bid -BNP on 7/13 showed 1595   3. HTN: -BP stable currently  -Continue current medications  4. CAD without angina: -Continue medical therapy -On Xarelto in place of aspirin  5. Acute on CKD stage III: -Per Renal  6. COPD: -Off BiPAP -Per pulmonary    Signed, Christell Faith, PA-C [place] Pager: [phone] 03/10/2016, 7:35 AM

## 2016-03-11 ENCOUNTER — Encounter: Payer: Self-pay | Admitting: General Practice

## 2016-03-11 LAB — BASIC METABOLIC PANEL
ANION GAP: 7 (ref 5–15)
BUN: 92 mg/dL — ABNORMAL HIGH (ref 6–20)
CHLORIDE: 98 mmol/L — AB (ref 101–111)
CO2: 35 mmol/L — AB (ref 22–32)
CREATININE: 1.77 mg/dL — AB (ref 0.44–1.00)
Calcium: 8.7 mg/dL — ABNORMAL LOW (ref 8.9–10.3)
GFR calc non Af Amer: 26 mL/min — ABNORMAL LOW (ref >60)
GFR, EST AFRICAN AMERICAN: 30 mL/min — AB (ref >60)
Glucose, Bld: 118 mg/dL — ABNORMAL HIGH (ref 65–99)
Potassium: 4 mmol/L (ref 3.5–5.1)
SODIUM: 140 mmol/L (ref 135–145)

## 2016-03-11 LAB — CBC WITH DIFFERENTIAL/PLATELET
BASOS ABS: 0 10*3/uL (ref 0–0.1)
BASOS PCT: 0 %
EOS ABS: 0 10*3/uL (ref 0–0.7)
Eosinophils Relative: 0 %
HEMATOCRIT: 28.9 % — AB (ref 35.0–47.0)
HEMOGLOBIN: 9.8 g/dL — AB (ref 12.0–16.0)
Lymphocytes Relative: 6 %
Lymphs Abs: 0.4 10*3/uL — ABNORMAL LOW (ref 1.0–3.6)
MCH: 26.8 pg (ref 26.0–34.0)
MCHC: 33.9 g/dL (ref 32.0–36.0)
MCV: 79.2 fL — ABNORMAL LOW (ref 80.0–100.0)
MONOS PCT: 5 %
Monocytes Absolute: 0.4 10*3/uL (ref 0.2–0.9)
NEUTROS ABS: 6.4 10*3/uL (ref 1.4–6.5)
NEUTROS PCT: 89 %
Platelets: 208 10*3/uL (ref 150–440)
RBC: 3.64 MIL/uL — AB (ref 3.80–5.20)
RDW: 16.9 % — ABNORMAL HIGH (ref 11.5–14.5)
WBC: 7.2 10*3/uL (ref 3.6–11.0)

## 2016-03-11 LAB — GLUCOSE, CAPILLARY
GLUCOSE-CAPILLARY: 149 mg/dL — AB (ref 65–99)
GLUCOSE-CAPILLARY: 123 mg/dL — AB (ref 65–99)
GLUCOSE-CAPILLARY: 141 mg/dL — AB (ref 65–99)
GLUCOSE-CAPILLARY: 196 mg/dL — AB (ref 65–99)
Glucose-Capillary: 110 mg/dL — ABNORMAL HIGH (ref 65–99)
Glucose-Capillary: 182 mg/dL — ABNORMAL HIGH (ref 65–99)
Glucose-Capillary: 92 mg/dL (ref 65–99)

## 2016-03-11 NOTE — Progress Notes (Signed)
O2 sats dropped x's 2 below 88% in last 30 minutes.  Patient sleeping soundly.  O2 @ 2L's via n/c.

## 2016-03-11 NOTE — Progress Notes (Signed)
PULMONARY / CRITICAL CARE MEDICINE   Name: Regina Burke MRN: [id number] DOB: February 06, 1933    ADMISSION DATE:  03/04/2016 CONSULTATION DATE:  03/04/16  REFERRING MD: EDP  CHIEF COMPLAINT:Acute shortness of breath  DISCUSSION  82 year old female with multiple comorbidities such as COPD, CHF, chronic kidney disease, hypertension, sleep apnea now presenting with bilateral lower lobe opacities likely related to acute CHF with uncontrolled Afib. Patient was cardioverted on 7/12. SR currently. Patient is off and on BiPAP.   SUBJECTIVE: Currently off bipap on 3L O2 via nasal canula denies any acute issues.   No current facility-administered medications on file prior to encounter.   Current Outpatient Prescriptions on File Prior to Encounter  Medication Sig  . ALPRAZolam (XANAX) 0.25 MG tablet Take 0.25 mg by mouth 2 (two) times daily as needed for anxiety.  Marland Kitchen amiodarone (PACERONE) 400 MG tablet Take 1 tablet (400 mg total) by mouth 2 (two) times daily.  Marland Kitchen atorvastatin (LIPITOR) 20 MG tablet Take 1 tablet (20 mg total) by mouth every evening.  . cholecalciferol (VITAMIN D) 400 UNITS TABS tablet Take 800 Units by mouth every evening.   . cyanocobalamin 500 MCG tablet Take 500 mcg by mouth every evening.  . diltiazem (CARDIZEM CD) 240 MG 24 hr capsule Take 1 capsule (240 mg total) by mouth every evening.  . febuxostat (ULORIC) 40 MG tablet Take 40 mg by mouth every evening.   . fenofibrate (TRICOR) 48 MG tablet Take 48 mg by mouth every evening.   . ferrous sulfate 325 (65 FE) MG EC tablet Take 325 mg by mouth every evening.   . fluticasone furoate-vilanterol (BREO ELLIPTA) 100-25 MCG/INH AEPB Inhale 1 puff into the lungs daily.  . furosemide (LASIX) 20 MG tablet Take 20 mg by mouth daily.   . metoprolol succinate (TOPROL XL) 25 MG 24 hr tablet Take 50 mg by mouth 2 (two) times daily.   . nitroGLYCERIN (NITROSTAT) 0.4 MG SL tablet Place 1 tablet (0.4 mg total) under the tongue every 5 (five)  minutes as needed for chest pain.  . pantoprazole (PROTONIX) 40 MG tablet Take 40 mg by mouth every evening.   . Rivaroxaban (XARELTO) 15 MG TABS tablet Take 1 tablet (15 mg total) by mouth daily with supper.  . sertraline (ZOLOFT) 50 MG tablet Take 50 mg by mouth every evening.   . tiotropium (SPIRIVA) 18 MCG inhalation capsule Place 18 mcg into inhaler and inhale daily.   REVIEW OF SYSTEMS: Positives in BOLD Gen:  fever, chills, weight change, fatigue, night sweats HEENT: Denies blurred vision, double vision, hearing loss, tinnitus, sinus congestion, rhinorrhea, sore throat, neck stiffness, dysphagia PULM:  shortness of breath, cough, sputum production, hemoptysis, wheezing CV: Denies chest pain, edema, orthopnea, paroxysmal nocturnal dyspnea, palpitations GI: Denies abdominal pain, nausea, vomiting, diarrhea, hematochezia, melena, constipation, change in bowel habits GU: Denies dysuria, hematuria, polyuria, oliguria, urethral discharge Endocrine: Denies hot or cold intolerance, polyuria, polyphagia or appetite change Derm: Denies rash, dry skin, scaling or peeling skin change Heme: Denies easy bruising, bleeding, bleeding gums Neuro: Denies headache, numbness, weakness, slurred speech, loss of memory or consciousness   VITAL SIGNS: BP 118/76 mmHg  Pulse 106  Temp(Src) 98 F (36.7 C) (Oral)  Resp 22  Ht '5\' 5"'$  (1.651 m)  Wt 185 lb 6.5 oz (84.1 kg)  BMI 30.85 kg/m2  SpO2 96%      INTAKE / OUTPUT: I/O last 3 completed shifts: In: 360 [P.O.:360] Out: 2900 [Urine:2900]  PHYSICAL EXAMINATION: General:Elderly white  female no acute distress Neuro:  Alert and oriented, follows commands  HEENT: Atraumatic, normocephalic, supple, no JVD Cardiovascular: irregular irregular, no M/R/G  Lungs:  Clear bilaterally, symmetric chest expansion, no wheezes crackles rhonchi noted Abdomen:  Soft, nontender, active bowel sounds Musculoskeletal:  Normal bulk and tone Skin:  Grossly  intact  LABS:  BMET  Recent Labs Lab 03/09/16 0648 03/10/16 0526 03/11/16 0441  NA 139 141 140  K 4.2 3.9 4.0  CL 96* 97* 98*  CO2 33* 36* 35*  BUN 110* 106* 92*  CREATININE 2.29* 2.10* 1.77*  GLUCOSE 135* 115* 118*    Electrolytes  Recent Labs Lab 03/05/16 0505  03/07/16 0447  03/09/16 0648 03/10/16 0526 03/11/16 0441  CALCIUM 8.1*  < > 8.7*  < > 8.7* 8.9 8.7*  MG 2.4  --   --   --   --  2.4  --   PHOS 5.3*  --  6.1*  --  5.8* 3.4  --   < > = values in this interval not displayed.  CBC  Recent Labs Lab 03/09/16 0648 03/10/16 0526 03/11/16 0441  WBC 4.9 4.9 7.2  HGB 9.6* 9.8* 9.8*  HCT 28.6* 28.7* 28.9*  PLT 201 206 208    Coag's  Recent Labs Lab 03/05/16 0722 03/10/16 0526  INR 1.51 1.71    Sepsis Markers  Recent Labs Lab 03/04/16 2100 03/04/16 2140 03/05/16 0040 03/05/16 0505 03/06/16 0433  LATICACIDVEN  --  0.9 1.0  --   --   PROCALCITON <0.10  --   --  0.26 0.63    ABG  Recent Labs Lab 03/04/16 2111 03/05/16 0454 03/10/16 1100  PHART 7.23* 7.30* 7.43  PCO2ART 80* 63* 52*  PO2ART 83 111* 89    Liver Enzymes  Recent Labs Lab 03/07/16 0447 03/09/16 0648  ALBUMIN 3.6 3.5    Cardiac Enzymes  Recent Labs Lab 03/04/16 2100  TROPONINI <0.03    Glucose  Recent Labs Lab 03/10/16 1128 03/10/16 1553 03/10/16 1935 03/11/16 0012 03/11/16 0332 03/11/16 0745  GLUCAP 263* 227* 176* 92 149* 123*    Imaging No results found.   STUDIES: Ct of chest 7/11>>Bilateral pleural effusions and associated lower lobe consolidation stable from the prior study. Stable changes in the right upper lobe similar to that seen on the recent exam. Slight improvement in the ground-glass density within the left upper lobe.  CULTURES: 7/8 Blood cultures >>negative 7/13 7/18 Urine culture >>negative 7/10  ANTIBIOTICS: 7/8 vancomycin>>7/10 7/8 azactam>>7/10   LINES/TUBES: PIV's  ASSESSMENT / PLAN: 82 year old female with  multiple comorbidities such as COPD, CHF, chronic kidney disease, hypertension, sleep apnea now presenting with bilateral lower lobe opacities likely related to acute CHF with uncontrolled Afib  PULMONARY A:  Acute on chronic respiratory failure History of sleep apnea  Hx of right upper lobe cancer  P:   Continue BiPAP as needed Supplemental O2 to maintain O2 sats 88% or above or for dyspnea ABG and CXR as needed Continue Bronchodilators Taper steroids  CARDIOVASCULAR A:  Shock possible caridiogenic Hypotension-resolved History of hypertension Atrial fibrillation- cardioverted on 03/08/16 History of Hyperlipidemia History of chronic diastolic CHF  P:  Keep MAP goals>65 Continue po Cardizem and Lasix Continue outpatient xarelto  RENAL A:   Acute on chronic kidney disease P:  Continue renal diet with 1200 ml fluid restriction Replace electrolytes per ICU protocol Follow chemistry  Monitor UOP  GASTROINTESTINAL A:   History of GERD P:  Pepcid for GERD  HEMATOLOGIC  A:   Anemia P:  Monitor for s/sx of bleeding Transfuse if Hgb< 7 Xarelto for VTE prophylaxis  INFECTIOUS No acute issues P:  Trend WBC and monitor fever curve  ENDOCRINE A:   Diabetes Melitus Type II P:   CBG's q4hrs SSI coverage  NEUROLOGIC A:   Hx of depression P:   RASS goal:0 Continue outpatient zoloft and xanax  Note: Plans for meeting with care management and pts son [name] per RN on 7/17 for discussion of placement upon discharge.    Marda Stalker, [place] Pager [phone] (please enter 7 digits) PCCM Consult Pager [phone] (please enter 7 digits)

## 2016-03-11 NOTE — Progress Notes (Signed)
[place] for constipation management    Allergies  Allergen Reactions  . Ciprofloxacin Shortness Of Breath, Itching and Rash  . Doxycycline Shortness Of Breath, Itching and Rash  . Penicillins Shortness Of Breath, Itching, Rash and Other (See Comments)    Has patient had a PCN reaction causing immediate rash, facial/tongue/throat swelling, SOB or lightheadedness with hypotension: Yes Has patient had a PCN reaction causing severe rash involving mucus membranes or skin necrosis: No Has patient had a PCN reaction that required hospitalization No Has patient had a PCN reaction occurring within the last 10 years: No If all of the above answers are "NO", then may proceed with Cephalosporin use.  . Sulfa Antibiotics Shortness Of Breath, Itching and Rash  . Morphine And Related Itching  . Albuterol Sulfate   . Cefuroxime Rash    blisters    Patient Measurements: Height: '5\' 5"'$  (165.1 cm) Weight: 185 lb 6.5 oz (84.1 kg) IBW/kg (Calculated) : 57  Vital Signs: Temp: 98 F (36.7 C) (07/15 0500) Temp Source: Oral (07/15 0500) BP: 118/76 mmHg (07/15 0600) Pulse Rate: 106 (07/15 0600) Intake/Output from previous day: 07/14 0701 - 07/15 0700 In: 240 [P.O.:240] Out: 1800 [Urine:1800] Intake/Output from this shift:    Labs:  Recent Labs  03/09/16 0648 03/10/16 0526 03/11/16 0441  WBC 4.9 4.9 7.2  HGB 9.6* 9.8* 9.8*  HCT 28.6* 28.7* 28.9*  PLT 201 206 208  CREATININE 2.29* 2.10* 1.77*  MG  --  2.4  --   PHOS 5.8* 3.4  --   ALBUMIN 3.5  --   --    Estimated Creatinine Clearance: 27.1 mL/min (by C-G formula based on Cr of 1.77).   Assessment: Pharmacy consulted for constipation management for 82 yo female ICU patient. Patient has not had documented bowel movement this admission. Patient ordered senna/docusate 2 tabs BID starting 7/12 and received bisacodyl suppository x 1 on 7/13.    Plan:  Continue senna/docusate 1 tab PO BID.     Will assess for bowel movement in am.   7/15: Per RN, per pt had BM yesterday (7/14). No changes.   Pharmacy will continue to monitor and adjust per consult.   Rocky Morel 03/11/2016,8:13 AM

## 2016-03-11 NOTE — Progress Notes (Signed)
PT Cancellation Note  Patient Details Name: BRAYLIE BADAMI MRN: [id number] DOB: 20-Sep-1932   Cancelled Treatment:    Reason Eval/Treat Not Completed: States she would love to participate but that she is too tired from being the recliner and getting up to go to the bathroom a few times today; requests PT to come back tomorrow.    Kreg Shropshire, DPT 03/11/2016, 4:26 PM

## 2016-03-11 NOTE — Progress Notes (Signed)
Overnight oximetry report printed and placed on chart.

## 2016-03-11 NOTE — Progress Notes (Addendum)
Took over assignment from [employer]. Patient resting comfortably, pt reports no pain.

## 2016-03-11 NOTE — Progress Notes (Signed)
[facility]  ROUNDING NOTE   Subjective:   UOP 1800 Creatinine 1.77 (2.1) (2.29)  Off furosemide.   Objective:  Vital signs in last 24 hours:  Temp:  [97.2 F (36.2 C)-98.6 F (37 C)] 98 F (36.7 C) (07/15 0500) Pulse Rate:  [82-141] 106 (07/15 0600) Resp:  [13-28] 22 (07/15 0600) BP: (109-140)/(58-109) 118/76 mmHg (07/15 0600) SpO2:  [74 %-99 %] 96 % (07/15 0600) Weight:  [84.1 kg (185 lb 6.5 oz)] 84.1 kg (185 lb 6.5 oz) (07/15 0500)  Weight change: 0 kg (0 lb) Filed Weights   03/09/16 0442 03/10/16 0335 03/11/16 0500  Weight: 85 kg (187 lb 6.3 oz) 84.1 kg (185 lb 6.5 oz) 84.1 kg (185 lb 6.5 oz)    Intake/Output: I/O last 3 completed shifts: In: 360 [P.O.:360] Out: 2900 [Urine:2900]   Intake/Output this shift:     Physical Exam: General: Sitting up in bed  Head: [place]/AT  Eyes: Anicteric, PERRL  Neck: Supple, trachea midline  Lungs:  Clear bilaterally, 4L [place]  Heart: irregular  Abdomen:  Soft, nontender  Extremities:  no peripheral edema.  Neurologic: Nonfocal, moving all four extremities  Skin: No lesions       Basic Metabolic Panel:  Recent Labs Lab 03/05/16 0505  03/07/16 0447 03/08/16 0335 03/09/16 0648 03/10/16 0526 03/11/16 0441  NA 139  < > 137 139 139 141 140  K 4.1  < > 4.3 4.1 4.2 3.9 4.0  CL 103  < > 96* 96* 96* 97* 98*  CO2 27  < > 30 31 33* 36* 35*  GLUCOSE 150*  < > 115* 108* 135* 115* 118*  BUN 86*  < > 96* 108* 110* 106* 92*  CREATININE 2.86*  < > 3.08* 2.75* 2.29* 2.10* 1.77*  CALCIUM 8.1*  < > 8.7* 9.0 8.7* 8.9 8.7*  MG 2.4  --   --   --   --  2.4  --   PHOS 5.3*  --  6.1*  --  5.8* 3.4  --   < > = values in this interval not displayed.  Liver Function Tests:  Recent Labs Lab 03/07/16 0447 03/09/16 0648  ALBUMIN 3.6 3.5   No results for input(s): LIPASE, AMYLASE in the last 168 hours. No results for input(s): AMMONIA in the last 168 hours.  CBC:  Recent Labs Lab 03/04/16 2100 03/05/16 0505  03/07/16 0447 03/09/16 0648 03/10/16 0526 03/11/16 0441  WBC 15.4* 15.5* 8.5 4.9 4.9 7.2  NEUTROABS 12.1*  --   --   --   --  6.4  HGB 11.1* 10.9* 9.5* 9.6* 9.8* 9.8*  HCT 33.6* 32.6* 28.2* 28.6* 28.7* 28.9*  MCV 80.7 80.9 80.5 79.8* 79.5* 79.2*  PLT 482* 446* 245 201 206 208    Cardiac Enzymes:  Recent Labs Lab 03/04/16 2100  TROPONINI <0.03    BNP: Invalid input(s): POCBNP  CBG:  Recent Labs Lab 03/10/16 1553 03/10/16 1935 03/11/16 0012 03/11/16 0332 03/11/16 0745  GLUCAP 227* 176* 42 149* 35*    Microbiology: Results for orders placed or performed during the hospital encounter of 03/04/16  Blood Culture (routine x 2)     Status: None   Collection Time: 03/04/16  9:40 PM  Result Value Ref Range Status   Specimen Description BLOOD LEFT HAND  Final   Special Requests   Final    BOTTLES DRAWN AEROBIC AND ANAEROBIC 11CCAERO,12CCANA   Culture NO GROWTH 5 DAYS  Final   Report Status 03/09/2016 FINAL  Final  Blood Culture (routine x 2)     Status: None   Collection Time: 03/04/16  9:40 PM  Result Value Ref Range Status   Specimen Description BLOOD RIGHT HAND  Final   Special Requests   Final    BOTTLES DRAWN AEROBIC AND ANAEROBIC [place]   Culture NO GROWTH 5 DAYS  Final   Report Status 03/09/2016 FINAL  Final  MRSA PCR Screening     Status: None   Collection Time: 03/05/16 12:30 AM  Result Value Ref Range Status   MRSA by PCR NEGATIVE NEGATIVE Final    Comment:        The GeneXpert MRSA Assay (FDA approved for NASAL specimens only), is one component of a comprehensive MRSA colonization surveillance program. It is not intended to diagnose MRSA infection nor to guide or monitor treatment for MRSA infections.   Urine culture     Status: None   Collection Time: 03/05/16 10:33 AM  Result Value Ref Range Status   Specimen Description URINE, RANDOM  Final   Special Requests NONE  Final   Culture NO GROWTH Performed at [facility]    Final   Report Status 03/06/2016 FINAL  Final    Coagulation Studies:  Recent Labs  03/10/16 0526  LABPROT 20.1*  INR 1.71    Urinalysis: No results for input(s): COLORURINE, LABSPEC, PHURINE, GLUCOSEU, HGBUR, BILIRUBINUR, KETONESUR, PROTEINUR, UROBILINOGEN, NITRITE, LEUKOCYTESUR in the last 72 hours.  Invalid input(s): APPERANCEUR    Imaging: Dg Chest Port 1 View  03/10/2016  CLINICAL DATA:  Acute respiratory failure, asthma -COPD, CHF, coronary artery disease EXAM: PORTABLE CHEST 1 VIEW COMPARISON:  Portable chest x-ray of March 04, 2016 and chest CT scan of March 07, 2016 FINDINGS: The lungs are well-expanded. There remain bilateral pleural effusions which have increased slightly in volume. The pulmonary vascularity is less engorged. The interstitial markings are less prominent. The cardiac silhouette remains enlarged. There is calcification in the wall of the aortic arch. IMPRESSION: Mild interval improvement in pulmonary interstitial edema and bilateral pleural effusions. Persistent left basilar atelectasis or pneumonia. Electronically Signed   By: David  Martinique M.D.   On: 03/10/2016 07:51     Medications:     . ALPRAZolam  0.25 mg Oral QHS  . antiseptic oral rinse  7 mL Mouth Rinse q12n4p  . chlorhexidine  15 mL Mouth Rinse BID  . diltiazem  240 mg Oral Daily  . famotidine  20 mg Oral QHS  . fluticasone furoate-vilanterol  1 puff Inhalation Daily  . insulin aspart  0-24 Units Subcutaneous Q4H  . levalbuterol  0.63 mg Nebulization Once  . metoprolol tartrate  50 mg Oral BID  . ranolazine  500 mg Oral BID  . rivaroxaban  15 mg Oral Q supper  . senna-docusate  1 tablet Oral BID  . sertraline  50 mg Oral Daily  . tiotropium  18 mcg Inhalation Daily   metoprolol, ondansetron (ZOFRAN) IV  Assessment/ Plan:  Ms. NASHAYLA TELLERIA is a 82 y.o. white female with diabetes mellitus type II, coronary artery disease, atrial fibrillation, hypertension, hyperlipidemia, generalized  anxiety disorder, gout, anemia, history of lung cancer, COPD, atrial fibrillation, fatty liver disease, GERD, osteoporosis   1. Acute Renal Failure on Chronic kidney disease stage III with history of hyperkalemia and proteinuria: Baseline creatinine of 1.5, eGFR of 32.  Acute renal failure from cardiorenal syndrome and then overdiuresis.  Currently off ACE-I/ARB  due to hyperkalemia.   Chronic Kidney disease  secondary to diabetes and hypertension.  - Monitor potassium level. No acute indication for dialysis. Continue to monitor urine output and volume status.  - Hold fursoemide. Nonoliguric urine output.   2. Hypertension, atrial fibrillation with rapid ventricular response status post cardioversion however still irregular. Placed on amiodarone gtt with minimal improvement.  - Continue diltiazem and metoprolol for rate control.  - hold furosemide    3. Anemia with chronic kidney disease: Hemoglobin stable. 9.8   LOS: [place], Candita Borenstein 7/15/20179:11 AM

## 2016-03-12 DIAGNOSIS — N189 Chronic kidney disease, unspecified: Secondary | ICD-10-CM

## 2016-03-12 DIAGNOSIS — I509 Heart failure, unspecified: Secondary | ICD-10-CM

## 2016-03-12 LAB — CBC WITH DIFFERENTIAL/PLATELET
BASOS ABS: 0 10*3/uL (ref 0–0.1)
Basophils Relative: 0 %
Eosinophils Absolute: 0.1 10*3/uL (ref 0–0.7)
Eosinophils Relative: 1 %
HEMATOCRIT: 28.8 % — AB (ref 35.0–47.0)
Hemoglobin: 9.5 g/dL — ABNORMAL LOW (ref 12.0–16.0)
LYMPHS PCT: 10 %
Lymphs Abs: 0.7 10*3/uL — ABNORMAL LOW (ref 1.0–3.6)
MCH: 26.5 pg (ref 26.0–34.0)
MCHC: 33.1 g/dL (ref 32.0–36.0)
MCV: 80 fL (ref 80.0–100.0)
MONO ABS: 0.4 10*3/uL (ref 0.2–0.9)
Monocytes Relative: 6 %
NEUTROS ABS: 5.3 10*3/uL (ref 1.4–6.5)
Neutrophils Relative %: 83 %
Platelets: 179 10*3/uL (ref 150–440)
RBC: 3.6 MIL/uL — AB (ref 3.80–5.20)
RDW: 16.6 % — ABNORMAL HIGH (ref 11.5–14.5)
WBC: 6.5 10*3/uL (ref 3.6–11.0)

## 2016-03-12 LAB — GLUCOSE, CAPILLARY
GLUCOSE-CAPILLARY: 107 mg/dL — AB (ref 65–99)
GLUCOSE-CAPILLARY: 130 mg/dL — AB (ref 65–99)
GLUCOSE-CAPILLARY: 156 mg/dL — AB (ref 65–99)
Glucose-Capillary: 112 mg/dL — ABNORMAL HIGH (ref 65–99)
Glucose-Capillary: 128 mg/dL — ABNORMAL HIGH (ref 65–99)
Glucose-Capillary: 172 mg/dL — ABNORMAL HIGH (ref 65–99)

## 2016-03-12 LAB — BASIC METABOLIC PANEL
ANION GAP: 8 (ref 5–15)
BUN: 71 mg/dL — ABNORMAL HIGH (ref 6–20)
CALCIUM: 8.5 mg/dL — AB (ref 8.9–10.3)
CO2: 34 mmol/L — AB (ref 22–32)
CREATININE: 1.67 mg/dL — AB (ref 0.44–1.00)
Chloride: 98 mmol/L — ABNORMAL LOW (ref 101–111)
GFR calc Af Amer: 32 mL/min — ABNORMAL LOW (ref >60)
GFR, EST NON AFRICAN AMERICAN: 28 mL/min — AB (ref >60)
GLUCOSE: 122 mg/dL — AB (ref 65–99)
POTASSIUM: 3.9 mmol/L (ref 3.5–5.1)
Sodium: 140 mmol/L (ref 135–145)

## 2016-03-12 NOTE — Progress Notes (Signed)
Patient: Regina Burke / Admit Date: 03/04/2016 / Date of Encounter: 03/12/2016, 11:44 AM   Subjective: Overall, she feels much better with less dyspnea. She continues to be in atrial fibrillation with heart rate between 90-110. She wants to go home.  Review of Systems: Review of Systems  Constitutional: Positive for weight loss. Negative for fever, chills, malaise/fatigue and diaphoresis.  HENT: Negative for congestion.   Eyes: Negative for discharge and redness.  Respiratory: Positive for shortness of breath. Negative for cough, hemoptysis, sputum production and wheezing.        Improved SOB  Cardiovascular: Negative for chest pain, palpitations, claudication, leg swelling and PND.  Gastrointestinal: Negative for nausea and vomiting.  Musculoskeletal: Negative for falls.  Skin: Negative for rash.  Neurological: Negative for dizziness, sensory change, speech change, focal weakness, loss of consciousness and weakness.  Endo/Heme/Allergies: Does not bruise/bleed easily.  Psychiatric/Behavioral: The patient is not nervous/anxious.   All other systems reviewed and are negative.   Objective: Telemetry: Afib, 80's to low 100's bpm Physical Exam: Blood pressure 105/42, pulse 98, temperature 97.7 F (36.5 C), temperature source Oral, resp. rate 19, height '5\' 5"'$  (1.651 m), weight 190 lb 0.6 oz (86.2 kg), SpO2 96 %. Body mass index is 31.62 kg/(m^2). General: Well developed, well nourished, in no acute distress. Head: Normocephalic, atraumatic, sclera non-icteric, no xanthomas, nares are without discharge. Neck: Negative for carotid bruits. JVP not elevated. Lungs: Mildly diminished breath sounds at the base bilaterally. Breathing is unlabored. Heart: Irregularly-irregular S1 S2 without murmurs, rubs, or gallops.  Abdomen: Soft, non-tender, non-distended with normoactive bowel sounds. No rebound/guarding. Extremities: No clubbing or cyanosis. No edema. Distal pedal pulses are 2+ and  equal bilaterally. Neuro: Alert and oriented X 3. Moves all extremities spontaneously. Psych:  Responds to questions appropriately with a normal affect.   Intake/Output Summary (Last 24 hours) at 03/12/16 1144 Last data filed at 03/12/16 0347  Gross per 24 hour  Intake    240 ml  Output    500 ml  Net   -260 ml    Inpatient Medications:  . ALPRAZolam  0.25 mg Oral QHS  . antiseptic oral rinse  7 mL Mouth Rinse q12n4p  . chlorhexidine  15 mL Mouth Rinse BID  . diltiazem  240 mg Oral Daily  . famotidine  20 mg Oral QHS  . fluticasone furoate-vilanterol  1 puff Inhalation Daily  . insulin aspart  0-24 Units Subcutaneous Q4H  . levalbuterol  0.63 mg Nebulization Once  . metoprolol tartrate  50 mg Oral BID  . ranolazine  500 mg Oral BID  . rivaroxaban  15 mg Oral Q supper  . senna-docusate  1 tablet Oral BID  . sertraline  50 mg Oral Daily  . tiotropium  18 mcg Inhalation Daily   Infusions:    Labs:  Recent Labs  03/10/16 0526 03/11/16 0441 03/12/16 0427  NA 141 140 140  K 3.9 4.0 3.9  CL 97* 98* 98*  CO2 36* 35* 34*  GLUCOSE 115* 118* 122*  BUN 106* 92* 71*  CREATININE 2.10* 1.77* 1.67*  CALCIUM 8.9 8.7* 8.5*  MG 2.4  --   --   PHOS 3.4  --   --    No results for input(s): AST, ALT, ALKPHOS, BILITOT, PROT, ALBUMIN in the last 72 hours.  Recent Labs  03/11/16 0441 03/12/16 0427  WBC 7.2 6.5  NEUTROABS 6.4 5.3  HGB 9.8* 9.5*  HCT 28.9* 28.8*  MCV 79.2* 80.0  PLT 208 179   No results for input(s): CKTOTAL, CKMB, TROPONINI in the last 72 hours. Invalid input(s): POCBNP No results for input(s): HGBA1C in the last 72 hours.   Weights: Filed Weights   03/10/16 0335 03/11/16 0500 03/12/16 0345  Weight: 185 lb 6.5 oz (84.1 kg) 185 lb 6.5 oz (84.1 kg) 190 lb 0.6 oz (86.2 kg)     Radiology/Studies:  Dg Chest 1 View  03/04/2016  CLINICAL DATA:  Respiratory distress EXAM: CHEST 1 VIEW COMPARISON:  Chest radiograph dated 01/24/2016. CT chest dated 01/21/2016.  FINDINGS: Patchy bilateral lower lobe opacities, suspicious for pneumonia, less likely atelectasis. Superimposed mild interstitial edema is suspected. Small bilateral pleural effusions. No pneumothorax. Again noted is a stable focal opacity in the medial right upper lobe, most of which likely reflects radiation changes when correlating with prior CT, underlying residual/recurrent tumor not excluded. Cardiomegaly. IMPRESSION: Patchy bilateral lower lobe opacities, suspicious for pneumonia, less likely atelectasis. Superimposed mild interstitial edema is suspected. Small bilateral pleural effusions. Stable focal opacity in the medial right upper lobe, most of which likely reflects radiation changes when correlating with prior CT, underlying residual/recurrent tumor not excluded. Electronically Signed   By: Julian Hy M.D.   On: 03/04/2016 21:22   Ct Chest Wo Contrast  03/07/2016  CLINICAL DATA:  History of COPD with respiratory distress for the past 3 days EXAM: CT CHEST WITHOUT CONTRAST TECHNIQUE: Multidetector CT imaging of the chest was performed following the standard protocol without IV contrast. COMPARISON:  03/04/2016, 01/21/2016 FINDINGS: Cardiovascular: Calcifications of the thoracic aorta are noted without aneurysmal dilatation. The cardiac structures show coronary calcifications. Mediastinum/Nodes: The thoracic inlet demonstrates some nodularity within the right lobe of the thyroid. This is stable from the prior exam. No significant hilar or mediastinal adenopathy is noted. Lungs/Pleura: Bilateral pleural effusions are seen left slightly greater than right. Some associated lower lobe consolidation is noted again left greater than right. These changes are relatively stable from the prior exam. The ground-glass density seen in the left upper lobe is again identified and has improved somewhat in the interval from the prior exam. Scarring in the right upper lobe is again noted stable from the prior  exam as well. No new focal infiltrate is seen. Upper Abdomen: Stable cystic lesions are again seen in the spleen. Right adrenal lesion is again noted and stable. Musculoskeletal: No acute bony abnormality noted. IMPRESSION: Bilateral pleural effusions and associated lower lobe consolidation stable from the prior study. Stable changes in the right upper lobe similar to that seen on the recent exam. Slight improvement in the ground-glass density within the left upper lobe. Electronically Signed   By: Inez Catalina M.D.   On: 03/07/2016 16:36   Dg Chest Port 1 View  03/10/2016  CLINICAL DATA:  Acute respiratory failure, asthma -COPD, CHF, coronary artery disease EXAM: PORTABLE CHEST 1 VIEW COMPARISON:  Portable chest x-ray of March 04, 2016 and chest CT scan of March 07, 2016 FINDINGS: The lungs are well-expanded. There remain bilateral pleural effusions which have increased slightly in volume. The pulmonary vascularity is less engorged. The interstitial markings are less prominent. The cardiac silhouette remains enlarged. There is calcification in the wall of the aortic arch. IMPRESSION: Mild interval improvement in pulmonary interstitial edema and bilateral pleural effusions. Persistent left basilar atelectasis or pneumonia. Electronically Signed   By: David  Martinique M.D.   On: 03/10/2016 07:51     Assessment and Plan  Principal Problem:   Acute respiratory failure ([place])  Active Problems:   Coronary artery disease involving native coronary artery of native heart without angina pectoris   Acute on chronic renal failure (HCC)   Chronic obstructive pulmonary disease with acute exacerbation (HCC)   Essential hypertension   Acute on chronic diastolic CHF (congestive heart failure), NYHA class 1 (HCC)   Persistent atrial fibrillation ([place])   DNR (do not resuscitate) discussion   Palliative care encounter    1. Persistent Afib with RVR: -Status post DCCV x 1 with 200 J on 7/12 briefly converting to NSR,  though went back into Afib with RVR at 11:30 AM on 7/12 -Her pulmonary issues and COPD are likely playing a large role in her arrhythmia  -Efforts to control her arrhythmia will be futile. Aim for rate control -Amiodarone did not seem to be working, thus this was discontinued on 7/13 - Rate control seems to be reasonable at the present time on current dose of metoprolol 50 mg twice daily and diltiazem extended release 240 mg once daily. She is on anticoagulation with Xarelto 15 mg once daily. The ultimate plan is to proceed with AV nodal ablation and pacemaker placement if respiratory status allows. This is planned for outpatient. No further recommendations from a cardiac standpoint. We will sign off. The patient has a follow-up appointment with Dr.Gollan on July 21. -  2. Acute on chronic diastolic CHF: -Improved - She is off diuretics due to acute on chronic renal failure. Renal function has stabilized. I recommend resuming Lasix 40 mg once daily before hospital discharge.  3. HTN: -BP stable currently  -Continue current medications  4. CAD without angina: -Continue medical therapy  5. Intermittent respiratory failure requiring BiPAP use: The plan is to send her home with BiPAP if she qualifies. I discussed the case with Dr.Kasa.   Signed, Kathlyn [place], MD  03/12/2016, 11:44 AM

## 2016-03-12 NOTE — Progress Notes (Signed)
Physical Therapy Treatment Patient Details Name: UMAIMA SCHOLTEN MRN: [id number] DOB: January 31, 1933 Today's Date: 03/12/2016    History of Present Illness Patient is an 82 y/o female that presents after experiencing a-fib exacerbation and severe shortness of breath at home. Initiated on BiPap, amioderone drip. Has now been weaned to HFNC, amio-PO. She was cardioverted yesterday, unfortunately resumed a-fib within 2 hours.     PT Comments    Pt did well with great motivation and effort during ambulation and despite some fatigue and needing a few standing rest breaks she was safe and overall showed ability to return home (lives with son, daughter is also available).    Follow Up Recommendations  Home health PT     Equipment Recommendations       Recommendations for Other Services       Precautions / Restrictions Precautions Precautions: Fall Restrictions Weight Bearing Restrictions: No    Mobility  Bed Mobility               General bed mobility comments: Pt in recliner on arrival  Transfers Overall transfer level: Modified independent Equipment used: Rolling walker (2 wheeled) Transfers: Sit to/from Stand Sit to Stand: Min guard         General transfer comment: Pt able to rise to standing with no LOBs and good overall safety  Ambulation/Gait Ambulation/Gait assistance: Min guard Ambulation Distance (Feet): 200 Feet Assistive device: Rolling walker (2 wheeled)       General Gait Details: Pt with 2 standing rest breaks during the effort and she clearly becomes fatigued (HR increased to 120s) but she had no LOBs and generally showed great motivation and was safe t/o the bout.  Pt on 3 liters O2 t/o the effort, sats remain in mid/low 90s   Stairs            Wheelchair Mobility    Modified Rankin (Stroke Patients Only)       Balance                                    Cognition Arousal/Alertness: Awake/alert Behavior During  Therapy: WFL for tasks assessed/performed Overall Cognitive Status: Within Functional Limits for tasks assessed                      Exercises      General Comments        Pertinent Vitals/Pain Pain Assessment: No/denies pain    Home Living                      Prior Function            PT Goals (current goals can now be found in the care plan section) Progress towards PT goals: Progressing toward goals    Frequency  Min 2X/week    PT Plan Current plan remains appropriate    Co-evaluation             End of Session Equipment Utilized During Treatment: Gait belt;Oxygen Activity Tolerance: Patient tolerated treatment well;Patient limited by fatigue Patient left: in chair;with call bell/phone within reach;with nursing/sitter in room     Time: 1020-1037 PT Time Calculation (min) (ACUTE ONLY): 17 min  Charges:  $Gait Training: 8-22 mins                    G Codes:      Kedarius Aloisi  Irven Shelling , DPT  03/12/2016, 1:02 PM

## 2016-03-12 NOTE — Progress Notes (Signed)
[facility]  ROUNDING NOTE   Subjective:   Creatinine 1.67 (1.77)  Off furosemide.   She states BiPap is helping her sleep. Not complaint with CPAP at home.   Objective:  Vital signs in last 24 hours:  Temp:  [97.7 F (36.5 C)-98.8 F (37.1 C)] 97.7 F (36.5 C) (07/16 0800) Pulse Rate:  [28-117] 98 (07/16 1000) Resp:  [11-27] 19 (07/16 1000) BP: (87-129)/(42-101) 105/42 mmHg (07/16 1000) SpO2:  [90 %-100 %] 96 % (07/16 1000) Weight:  [86.2 kg (190 lb 0.6 oz)] 86.2 kg (190 lb 0.6 oz) (07/16 0345)  Weight change: 2.1 kg (4 lb 10.1 oz) Filed Weights   03/10/16 0335 03/11/16 0500 03/12/16 0345  Weight: 84.1 kg (185 lb 6.5 oz) 84.1 kg (185 lb 6.5 oz) 86.2 kg (190 lb 0.6 oz)    Intake/Output: I/O last 3 completed shifts: In: 240 [P.O.:240] Out: 1610 [Urine:1550]   Intake/Output this shift:     Physical Exam: General: Sitting up in bed  Head: [place]/AT  Eyes: Anicteric, PERRL  Neck: Supple, trachea midline  Lungs:  Clear bilaterally, 2L   Heart: irregular  Abdomen:  Soft, nontender  Extremities:  no peripheral edema.  Neurologic: Nonfocal, moving all four extremities  Skin: No lesions       Basic Metabolic Panel:  Recent Labs Lab 03/07/16 0447 03/08/16 0335 03/09/16 0648 03/10/16 0526 03/11/16 0441 03/12/16 0427  NA 137 139 139 141 140 140  K 4.3 4.1 4.2 3.9 4.0 3.9  CL 96* 96* 96* 97* 98* 98*  CO2 30 31 33* 36* 35* 34*  GLUCOSE 115* 108* 135* 115* 118* 122*  BUN 96* 108* 110* 106* 92* 71*  CREATININE 3.08* 2.75* 2.29* 2.10* 1.77* 1.67*  CALCIUM 8.7* 9.0 8.7* 8.9 8.7* 8.5*  MG  --   --   --  2.4  --   --   PHOS 6.1*  --  5.8* 3.4  --   --     Liver Function Tests:  Recent Labs Lab 03/07/16 0447 03/09/16 0648  ALBUMIN 3.6 3.5   No results for input(s): LIPASE, AMYLASE in the last 168 hours. No results for input(s): AMMONIA in the last 168 hours.  CBC:  Recent Labs Lab 03/07/16 0447 03/09/16 0648 03/10/16 0526 03/11/16 0441  03/12/16 0427  WBC 8.5 4.9 4.9 7.2 6.5  NEUTROABS  --   --   --  6.4 5.3  HGB 9.5* 9.6* 9.8* 9.8* 9.5*  HCT 28.2* 28.6* 28.7* 28.9* 28.8*  MCV 80.5 79.8* 79.5* 79.2* 80.0  PLT 245 201 206 208 179    Cardiac Enzymes: No results for input(s): CKTOTAL, CKMB, CKMBINDEX, TROPONINI in the last 168 hours.  BNP: Invalid input(s): POCBNP  CBG:  Recent Labs Lab 03/11/16 1623 03/11/16 1950 03/11/16 2345 03/12/16 0337 03/12/16 0732  GLUCAP 196* 182* 110* 112* 107*    Microbiology: Results for orders placed or performed during the hospital encounter of 03/04/16  Blood Culture (routine x 2)     Status: None   Collection Time: 03/04/16  9:40 PM  Result Value Ref Range Status   Specimen Description BLOOD LEFT HAND  Final   Special Requests   Final    BOTTLES DRAWN AEROBIC AND ANAEROBIC 11CCAERO,12CCANA   Culture NO GROWTH 5 DAYS  Final   Report Status 03/09/2016 FINAL  Final  Blood Culture (routine x 2)     Status: None   Collection Time: 03/04/16  9:40 PM  Result Value Ref Range Status  Specimen Description BLOOD RIGHT HAND  Final   Special Requests   Final    BOTTLES DRAWN AEROBIC AND ANAEROBIC [place]   Culture NO GROWTH 5 DAYS  Final   Report Status 03/09/2016 FINAL  Final  MRSA PCR Screening     Status: None   Collection Time: 03/05/16 12:30 AM  Result Value Ref Range Status   MRSA by PCR NEGATIVE NEGATIVE Final    Comment:        The GeneXpert MRSA Assay (FDA approved for NASAL specimens only), is one component of a comprehensive MRSA colonization surveillance program. It is not intended to diagnose MRSA infection nor to guide or monitor treatment for MRSA infections.   Urine culture     Status: None   Collection Time: 03/05/16 10:33 AM  Result Value Ref Range Status   Specimen Description URINE, RANDOM  Final   Special Requests NONE  Final   Culture NO GROWTH Performed at [facility]   Final   Report Status 03/06/2016 FINAL  Final     Coagulation Studies:  Recent Labs  03/10/16 0526  LABPROT 20.1*  INR 1.71    Urinalysis: No results for input(s): COLORURINE, LABSPEC, PHURINE, GLUCOSEU, HGBUR, BILIRUBINUR, KETONESUR, PROTEINUR, UROBILINOGEN, NITRITE, LEUKOCYTESUR in the last 72 hours.  Invalid input(s): APPERANCEUR    Imaging: No results found.   Medications:     . ALPRAZolam  0.25 mg Oral QHS  . antiseptic oral rinse  7 mL Mouth Rinse q12n4p  . chlorhexidine  15 mL Mouth Rinse BID  . diltiazem  240 mg Oral Daily  . famotidine  20 mg Oral QHS  . fluticasone furoate-vilanterol  1 puff Inhalation Daily  . insulin aspart  0-24 Units Subcutaneous Q4H  . levalbuterol  0.63 mg Nebulization Once  . metoprolol tartrate  50 mg Oral BID  . ranolazine  500 mg Oral BID  . rivaroxaban  15 mg Oral Q supper  . senna-docusate  1 tablet Oral BID  . sertraline  50 mg Oral Daily  . tiotropium  18 mcg Inhalation Daily   metoprolol, ondansetron (ZOFRAN) IV  Assessment/ Plan:  Ms. DEMMI SINDT is a 82 y.o. white female with diabetes mellitus type II, coronary artery disease, atrial fibrillation, hypertension, hyperlipidemia, generalized anxiety disorder, gout, anemia, history of lung cancer, COPD, atrial fibrillation, fatty liver disease, GERD, osteoporosis   1. Acute Renal Failure on Chronic kidney disease stage III with history of hyperkalemia and proteinuria: Baseline creatinine of 1.5, eGFR of 32.  Acute renal failure from cardiorenal syndrome and then overdiuresis.  Currently off ACE-I/ARB  due to hyperkalemia.   Chronic Kidney disease secondary to diabetes and hypertension.  - Monitor potassium level. No acute indication for dialysis. Continue to monitor urine output and volume status.  - Hold fursoemide. Nonoliguric urine output.   2. Hypertension, atrial fibrillation with rapid ventricular response status post cardioversion however still irregular. Placed on amiodarone gtt with minimal improvement.  -  Continue diltiazem and metoprolol for rate control. Rivaroxaban for anticoagulation - hold furosemide for now  3. Anemia with chronic kidney disease: Hemoglobin stable. 9.5   LOS: 8 Malynn Lucy 7/16/201710:58 AM

## 2016-03-12 NOTE — Progress Notes (Signed)
PULMONARY / CRITICAL CARE MEDICINE   Name: Regina Burke MRN: [id number] DOB: 1933/08/22    ADMISSION DATE:  03/04/2016 CONSULTATION DATE:  03/04/16  REFERRING MD: EDP  CHIEF COMPLAINT:Acute shortness of breath  DISCUSSION  82 year old female with multiple comorbidities such as COPD, CHF, chronic kidney disease, hypertension, sleep apnea now presenting with bilateral lower lobe opacities likely related to acute CHF with uncontrolled Afib. Patient was cardioverted on 7/12. SR currently. Patient is off and on BiPAP.   SUBJECTIVE: Currently off bipap on 3L O2 via nasal canula denies any acute issues.   No current facility-administered medications on file prior to encounter.   Current Outpatient Prescriptions on File Prior to Encounter  Medication Sig  . ALPRAZolam (XANAX) 0.25 MG tablet Take 0.25 mg by mouth 2 (two) times daily as needed for anxiety.  Marland Kitchen amiodarone (PACERONE) 400 MG tablet Take 1 tablet (400 mg total) by mouth 2 (two) times daily.  Marland Kitchen atorvastatin (LIPITOR) 20 MG tablet Take 1 tablet (20 mg total) by mouth every evening.  . cholecalciferol (VITAMIN D) 400 UNITS TABS tablet Take 800 Units by mouth every evening.   . cyanocobalamin 500 MCG tablet Take 500 mcg by mouth every evening.  . diltiazem (CARDIZEM CD) 240 MG 24 hr capsule Take 1 capsule (240 mg total) by mouth every evening.  . febuxostat (ULORIC) 40 MG tablet Take 40 mg by mouth every evening.   . fenofibrate (TRICOR) 48 MG tablet Take 48 mg by mouth every evening.   . ferrous sulfate 325 (65 FE) MG EC tablet Take 325 mg by mouth every evening.   . fluticasone furoate-vilanterol (BREO ELLIPTA) 100-25 MCG/INH AEPB Inhale 1 puff into the lungs daily.  . furosemide (LASIX) 20 MG tablet Take 20 mg by mouth daily.   . metoprolol succinate (TOPROL XL) 25 MG 24 hr tablet Take 50 mg by mouth 2 (two) times daily.   . nitroGLYCERIN (NITROSTAT) 0.4 MG SL tablet Place 1 tablet (0.4 mg total) under the tongue every 5 (five)  minutes as needed for chest pain.  . pantoprazole (PROTONIX) 40 MG tablet Take 40 mg by mouth every evening.   . Rivaroxaban (XARELTO) 15 MG TABS tablet Take 1 tablet (15 mg total) by mouth daily with supper.  . sertraline (ZOLOFT) 50 MG tablet Take 50 mg by mouth every evening.   . tiotropium (SPIRIVA) 18 MCG inhalation capsule Place 18 mcg into inhaler and inhale daily.   REVIEW OF SYSTEMS: Positives in BOLD Gen:  fever, chills, weight change, fatigue, night sweats HEENT: Denies blurred vision, double vision, hearing loss, tinnitus, sinus congestion, rhinorrhea, sore throat, neck stiffness, dysphagia PULM:  shortness of breath, cough, sputum production, hemoptysis, wheezing CV: Denies chest pain, edema, orthopnea, paroxysmal nocturnal dyspnea, palpitations GI: Denies abdominal pain, nausea, vomiting, diarrhea, hematochezia, melena, constipation, change in bowel habits GU: Denies dysuria, hematuria, polyuria, oliguria, urethral discharge Endocrine: Denies hot or cold intolerance, polyuria, polyphagia or appetite change Derm: Denies rash, dry skin, scaling or peeling skin change Heme: Denies easy bruising, bleeding, bleeding gums Neuro: Denies headache, numbness, weakness, slurred speech, loss of memory or consciousness   VITAL SIGNS: BP 117/50 mmHg  Pulse 102  Temp(Src) 98.1 F (36.7 C) (Oral)  Resp 17  Ht '5\' 5"'$  (1.651 m)  Wt 190 lb 0.6 oz (86.2 kg)  BMI 31.62 kg/m2  SpO2 93%      INTAKE / OUTPUT: I/O last 3 completed shifts: In: 240 [P.O.:240] Out: 1550 [[id number]:[id number]]  PHYSICAL EXAMINATION: General:Elderly white  female no acute distress Neuro:  Alert and oriented, follows commands  HEENT: Atraumatic, normocephalic, supple, no JVD Cardiovascular: irregular irregular, no M/R/G  Lungs:  Clear bilaterally, symmetric chest expansion, no wheezes crackles rhonchi noted Abdomen:  Soft, nontender, active bowel sounds Musculoskeletal:  Normal bulk and tone Skin:  Grossly  intact  LABS:  BMET  Recent Labs Lab 03/10/16 0526 03/11/16 0441 03/12/16 0427  NA 141 140 140  K 3.9 4.0 3.9  CL 97* 98* 98*  CO2 36* 35* 34*  BUN 106* 92* 71*  CREATININE 2.10* 1.77* 1.67*  GLUCOSE 115* 118* 122*    Electrolytes  Recent Labs Lab 03/07/16 0447  03/09/16 0648 03/10/16 0526 03/11/16 0441 03/12/16 0427  CALCIUM 8.7*  < > 8.7* 8.9 8.7* 8.5*  MG  --   --   --  2.4  --   --   PHOS 6.1*  --  5.8* 3.4  --   --   < > = values in this interval not displayed.  CBC  Recent Labs Lab 03/10/16 0526 03/11/16 0441 03/12/16 0427  WBC 4.9 7.2 6.5  HGB 9.8* 9.8* 9.5*  HCT 28.7* 28.9* 28.8*  PLT 206 208 179    Coag's  Recent Labs Lab 03/10/16 0526  INR 1.71    Sepsis Markers  Recent Labs Lab 03/06/16 0433  PROCALCITON 0.63    ABG  Recent Labs Lab 03/10/16 1100  PHART 7.43  PCO2ART 52*  PO2ART 89    Liver Enzymes  Recent Labs Lab 03/07/16 0447 03/09/16 0648  ALBUMIN 3.6 3.5    Cardiac Enzymes No results for input(s): TROPONINI, PROBNP in the last 168 hours.  Glucose  Recent Labs Lab 03/11/16 1950 03/11/16 2345 03/12/16 0337 03/12/16 0732 03/12/16 1228 03/12/16 1636  GLUCAP 182* 110* 112* 107* 156* 128*    Imaging No results found.   STUDIES: Ct of chest 7/11>>Bilateral pleural effusions and associated lower lobe consolidation stable from the prior study. Stable changes in the right upper lobe similar to that seen on the recent exam. Slight improvement in the ground-glass density within the left upper lobe.  CULTURES: 7/8 Blood cultures >>negative 7/13 7/18 Urine culture >>negative 7/10  ANTIBIOTICS: 7/8 vancomycin>>7/10 7/8 azactam>>7/10   LINES/TUBES: PIV's  ASSESSMENT / PLAN: 82 year old female with multiple comorbidities such as COPD, CHF, chronic kidney disease, hypertension, sleep apnea now presenting with bilateral lower lobe opacities likely related to acute CHF with uncontrolled  Afib     Overnight Pulse oximetry verbally reported to me that oxygen stats dropped to 74% at 3 AM Patient with chronic hypoxic resp failure and chronic resp acidosis based on ABG results  Patient needs NONINVASIVE VENTILATION to survive and to prevent death. She has evidence of Chronic hypoxic and hypercapnic resp failure.    PULMONARY A:  Acute on chronic respiratory failure History of sleep apnea  Hx of right upper lobe cancer  P:   Continue BiPAP as needed Supplemental O2 to maintain O2 sats 88% or above or for dyspnea ABG and CXR as needed Continue Bronchodilators Taper steroids  CARDIOVASCULAR A:  Shock possible caridiogenic Hypotension-resolved History of hypertension Atrial fibrillation- cardioverted on 03/08/16 History of Hyperlipidemia History of chronic diastolic CHF  P:  Keep MAP goals>65 Continue po Cardizem and Lasix Continue outpatient xarelto  RENAL A:   Acute on chronic kidney disease P:  Continue renal diet with 1200 ml fluid restriction Replace electrolytes per ICU protocol Follow chemistry  Monitor UOP  GASTROINTESTINAL A:   History  of GERD P:  Pepcid for GERD  HEMATOLOGIC A:   Anemia P:  Monitor for s/sx of bleeding Transfuse if Hgb< 7 Xarelto for VTE prophylaxis  INFECTIOUS No acute issues P:  Trend WBC and monitor fever curve  ENDOCRINE A:   Diabetes Melitus Type II P:   CBG's q4hrs SSI coverage  NEUROLOGIC A:   Hx of depression P:   RASS goal:0 Continue outpatient zoloft and xanax  Note: Plans for meeting with care management and pts son [name] per RN on 7/17 for discussion of placement upon discharge.     Overnight Pulse oximetry verbally reported to me that oxygen stats dropped to 74% at 3 AM Patient with chronic hypoxic resp failure and chronic resp acidosis based on ABG results  Patient needs NONINVASIVE VENTILATION to survive and to prevent death. She has evidence of Chronic hypoxic and hypercapnic resp  failure.     Damaris Hippo, M.D.  [facility]  Medical Director [place] Director [facility] Cardio-Pulmonary Department

## 2016-03-12 NOTE — Progress Notes (Signed)
[place] for constipation management    Allergies  Allergen Reactions  . Ciprofloxacin Shortness Of Breath, Itching and Rash  . Doxycycline Shortness Of Breath, Itching and Rash  . Penicillins Shortness Of Breath, Itching, Rash and Other (See Comments)    Has patient had a PCN reaction causing immediate rash, facial/tongue/throat swelling, SOB or lightheadedness with hypotension: Yes Has patient had a PCN reaction causing severe rash involving mucus membranes or skin necrosis: No Has patient had a PCN reaction that required hospitalization No Has patient had a PCN reaction occurring within the last 10 years: No If all of the above answers are "NO", then may proceed with Cephalosporin use.  . Sulfa Antibiotics Shortness Of Breath, Itching and Rash  . Morphine And Related Itching  . Albuterol Sulfate   . Cefuroxime Rash    blisters    Patient Measurements: Height: '5\' 5"'$  (165.1 cm) Weight: 190 lb 0.6 oz (86.2 kg) IBW/kg (Calculated) : 57  Vital Signs: Temp: 97.7 F (36.5 C) (07/16 0800) Temp Source: Oral (07/16 0800) BP: 98/54 mmHg (07/16 0800) Pulse Rate: 108 (07/16 0800) Intake/Output from previous day: 07/15 0701 - 07/16 0700 In: 240 [P.O.:240] Out: 500 [Urine:500] Intake/Output from this shift:    Labs:  Recent Labs  03/10/16 0526 03/11/16 0441 03/12/16 0427  WBC 4.9 7.2 6.5  HGB 9.8* 9.8* 9.5*  HCT 28.7* 28.9* 28.8*  PLT 206 208 179  CREATININE 2.10* 1.77* 1.67*  MG 2.4  --   --   PHOS 3.4  --   --    Estimated Creatinine Clearance: 29.1 mL/min (by C-G formula based on Cr of 1.67).   Assessment: Pharmacy consulted for constipation management for 82 yo female ICU patient. Patient has not had documented bowel movement this admission. Patient ordered senna/docusate 2 tabs BID starting 7/12 and received bisacodyl suppository x 1 on 7/13.    Plan:  Continue senna/docusate 1 tab PO BID.    Will assess for bowel  movement in am.   7/15: Per RN, per pt had BM yesterday (7/14). No changes.  7/16: Per chart, pt had BM on 7/15. No changes.   Pharmacy will continue to monitor and adjust per consult.   Rocky Morel 03/12/2016,8:43 AM

## 2016-03-13 ENCOUNTER — Encounter: Payer: Self-pay | Admitting: Cardiovascular Disease

## 2016-03-13 ENCOUNTER — Telehealth: Payer: Self-pay | Admitting: *Deleted

## 2016-03-13 DIAGNOSIS — J9622 Acute and chronic respiratory failure with hypercapnia: Secondary | ICD-10-CM

## 2016-03-13 LAB — GLUCOSE, CAPILLARY
GLUCOSE-CAPILLARY: 160 mg/dL — AB (ref 65–99)
GLUCOSE-CAPILLARY: 111 mg/dL — AB (ref 65–99)
GLUCOSE-CAPILLARY: 182 mg/dL — AB (ref 65–99)
GLUCOSE-CAPILLARY: 111 mg/dL — AB (ref 65–99)
Glucose-Capillary: 115 mg/dL — ABNORMAL HIGH (ref 65–99)
Glucose-Capillary: 143 mg/dL — ABNORMAL HIGH (ref 65–99)

## 2016-03-13 NOTE — Progress Notes (Signed)
   03/13/16 1436  BiPAP/CPAP/SIPAP  BiPAP/CPAP/SIPAP Pt Type Adult  Mask Type Full face mask  Mask Size Medium  Set Rate 12 breaths/min  Respiratory Rate 18 breaths/min  IPAP 16 cmH20  EPAP 10 cmH2O  Oxygen Percent 32 %  Minute Ventilation 11.8  Leak 2  Peak Inspiratory Pressure (PIP) 17  Tidal Volume (Vt) 448  BiPAP/CPAP/SIPAP BiPAP  Press High Alarm 25 cmH2O  Press Low Alarm 2 cmH2O  Placed patient back on bipap per her request.

## 2016-03-13 NOTE — Care Management Important Message (Signed)
Important Message  Patient Details  Name: Regina Burke MRN: [id number] Date of Birth: 10-26-1932   Medicare Important Message Given:  Yes    Beverly Sessions, RN 03/13/2016, 1:49 PM

## 2016-03-13 NOTE — Telephone Encounter (Signed)
-----   Message from Wilhelmina Mcardle, MD sent at 03/13/2016  8:20 AM EDT ----- Please schedule post hospital office follow up with DK in 2-4 weeks  Thanks, Waunita Schooner

## 2016-03-13 NOTE — Telephone Encounter (Signed)
Spoke with Merlyn on the unit and gave f/u appt for pt. Nothing further needed.

## 2016-03-13 NOTE — Progress Notes (Signed)
PULMONARY / CRITICAL CARE MEDICINE   Name: Regina Burke MRN: [id number] DOB: April 05, 1933    ADMISSION DATE:  03/04/2016 CONSULTATION DATE:  03/04/16  REFERRING MD: EDP  CHIEF COMPLAINT:Acute shortness of breath  DISCUSSION  82 year old female with multiple comorbidities such as COPD, CHF, chronic kidney disease, hypertension, sleep apnea now presenting with bilateral lower lobe opacities likely related to acute CHF with uncontrolled Afib. Patient was cardioverted on 7/12. SR currently. Patient is off and on BiPAP.   SUBJECTIVE: Currently off bipap on 3L O2 via nasal canula denies any acute issues.   No current facility-administered medications on file prior to encounter.   Current Outpatient Prescriptions on File Prior to Encounter  Medication Sig  . ALPRAZolam (XANAX) 0.25 MG tablet Take 0.25 mg by mouth 2 (two) times daily as needed for anxiety.  Marland Kitchen amiodarone (PACERONE) 400 MG tablet Take 1 tablet (400 mg total) by mouth 2 (two) times daily.  Marland Kitchen atorvastatin (LIPITOR) 20 MG tablet Take 1 tablet (20 mg total) by mouth every evening.  . cholecalciferol (VITAMIN D) 400 UNITS TABS tablet Take 800 Units by mouth every evening.   . cyanocobalamin 500 MCG tablet Take 500 mcg by mouth every evening.  . diltiazem (CARDIZEM CD) 240 MG 24 hr capsule Take 1 capsule (240 mg total) by mouth every evening.  . febuxostat (ULORIC) 40 MG tablet Take 40 mg by mouth every evening.   . fenofibrate (TRICOR) 48 MG tablet Take 48 mg by mouth every evening.   . ferrous sulfate 325 (65 FE) MG EC tablet Take 325 mg by mouth every evening.   . fluticasone furoate-vilanterol (BREO ELLIPTA) 100-25 MCG/INH AEPB Inhale 1 puff into the lungs daily.  . furosemide (LASIX) 20 MG tablet Take 20 mg by mouth daily.   . metoprolol succinate (TOPROL XL) 25 MG 24 hr tablet Take 50 mg by mouth 2 (two) times daily.   . nitroGLYCERIN (NITROSTAT) 0.4 MG SL tablet Place 1 tablet (0.4 mg total) under the tongue every 5 (five)  minutes as needed for chest pain.  . pantoprazole (PROTONIX) 40 MG tablet Take 40 mg by mouth every evening.   . Rivaroxaban (XARELTO) 15 MG TABS tablet Take 1 tablet (15 mg total) by mouth daily with supper.  . sertraline (ZOLOFT) 50 MG tablet Take 50 mg by mouth every evening.   . tiotropium (SPIRIVA) 18 MCG inhalation capsule Place 18 mcg into inhaler and inhale daily.   REVIEW OF SYSTEMS: Positives in BOLD Gen:  fever, chills, weight change, fatigue, night sweats HEENT: Denies blurred vision, double vision, hearing loss, tinnitus, sinus congestion, rhinorrhea, sore throat, neck stiffness, dysphagia PULM:  shortness of breath, cough, sputum production, hemoptysis, wheezing CV: Denies chest pain, edema, orthopnea, paroxysmal nocturnal dyspnea, palpitations GI: Denies abdominal pain, nausea, vomiting, diarrhea, hematochezia, melena, constipation, change in bowel habits GU: Denies dysuria, hematuria, polyuria, oliguria, urethral discharge Endocrine: Denies hot or cold intolerance, polyuria, polyphagia or appetite change Derm: Denies rash, dry skin, scaling or peeling skin change Heme: Denies easy bruising, bleeding, bleeding gums Neuro: Denies headache, numbness, weakness, slurred speech, loss of memory or consciousness   VITAL SIGNS: BP 124/72 mmHg  Pulse 98  Temp(Src) 97.6 F (36.4 C) (Oral)  Resp 16  Ht '5\' 5"'$  (1.651 m)  Wt 176 lb 4.8 oz (79.969 kg)  BMI 29.34 kg/m2  SpO2 98%   Vent Mode:  [-]  FiO2 (%):  [32 %] 32 %  INTAKE / OUTPUT: I/O last 3 completed shifts: In:  480 [P.O.:480] Out: 1625 [Urine:1625]  PHYSICAL EXAMINATION: General:Elderly white female no acute distress Neuro:  Alert and oriented, follows commands  HEENT: Atraumatic, normocephalic, supple, no JVD Cardiovascular: irregular irregular, no M/R/G  Lungs:  Clear bilaterally, symmetric chest expansion, no wheezes crackles rhonchi noted Abdomen:  Soft, nontender, active bowel sounds Musculoskeletal:  Normal  bulk and tone Skin:  Grossly intact  LABS:  BMET  Recent Labs Lab 03/10/16 0526 03/11/16 0441 03/12/16 0427  NA 141 140 140  K 3.9 4.0 3.9  CL 97* 98* 98*  CO2 36* 35* 34*  BUN 106* 92* 71*  CREATININE 2.10* 1.77* 1.67*  GLUCOSE 115* 118* 122*    Electrolytes  Recent Labs Lab 03/07/16 0447  03/09/16 0648 03/10/16 0526 03/11/16 0441 03/12/16 0427  CALCIUM 8.7*  < > 8.7* 8.9 8.7* 8.5*  MG  --   --   --  2.4  --   --   PHOS 6.1*  --  5.8* 3.4  --   --   < > = values in this interval not displayed.  CBC  Recent Labs Lab 03/10/16 0526 03/11/16 0441 03/12/16 0427  WBC 4.9 7.2 6.5  HGB 9.8* 9.8* 9.5*  HCT 28.7* 28.9* 28.8*  PLT 206 208 179    Coag's  Recent Labs Lab 03/10/16 0526  INR 1.71    Sepsis Markers No results for input(s): LATICACIDVEN, PROCALCITON, O2SATVEN in the last 168 hours.  ABG  Recent Labs Lab 03/10/16 1100  PHART 7.43  PCO2ART 52*  PO2ART 89    Liver Enzymes  Recent Labs Lab 03/07/16 0447 03/09/16 0648  ALBUMIN 3.6 3.5    Cardiac Enzymes No results for input(s): TROPONINI, PROBNP in the last 168 hours.  Glucose  Recent Labs Lab 03/12/16 1636 03/12/16 2023 03/13/16 03/13/16 0421 03/13/16 0734 03/13/16 1217  GLUCAP 128* 172* 130* 115* 143* 160*    Imaging No results found.   STUDIES: Ct of chest 7/11>>Bilateral pleural effusions and associated lower lobe consolidation stable from the prior study. Stable changes in the right upper lobe similar to that seen on the recent exam. Slight improvement in the ground-glass density within the left upper lobe.  CULTURES: 7/8 Blood cultures >>negative 7/13 7/18 Urine culture >>negative 7/10  ANTIBIOTICS: 7/8 vancomycin>>7/10 7/8 azactam>>7/10   LINES/TUBES: PIV's  ASSESSMENT / PLAN: 82 year old female with multiple comorbidities such as COPD, CHF, chronic kidney disease, hypertension, sleep apnea now presenting with bilateral lower lobe opacities likely  related to acute CHF with uncontrolled Afib  Patient needs NONINVASIVE VENTILATION (BiPAP) to survive and to prevent death. She has Chronic hypoxic and hypercapnic resp failure on the basis of COPD. Therefore, she would benefit from BiPAP.    PULMONARY A:  Acute on chronic respiratory failure History of sleep apnea  Hx of right upper lobe cancer  P:   Continue BiPAP as needed Supplemental O2 to maintain O2 sats 88% or above or for dyspnea ABG and CXR as needed Continue Bronchodilators  CARDIOVASCULAR A:  Shock possible caridiogenic Hypotension-resolved History of hypertension Atrial fibrillation- cardioverted on 03/08/16 History of Hyperlipidemia History of chronic diastolic CHF  P:  Keep MAP goals>65 Continue po metoprolol and cardizem Continue outpatient xarelto  RENAL A:   Acute on chronic kidney disease P:  Continue carb modified diet Follow chemistry  Monitor UOP  GASTROINTESTINAL A:   History of GERD P:  Pepcid for GERD  HEMATOLOGIC A:   Anemia P:  Monitor for s/sx of bleeding Transfuse if Hgb< 7 Xarelto  for VTE prophylaxis  INFECTIOUS No acute issues P:  Trend WBC and monitor fever curve  ENDOCRINE A:   Diabetes Melitus Type II P:   CBG's q4hrs SSI coverage  NEUROLOGIC A:   Hx of depression P:   RASS goal:0 Continue outpatient zoloft and xanax  Note: Waiting on approval from insurance for bipap at home for patient prior to discharge per case management 7/17  Marda Stalker, [place] Pager [phone] (please enter 7 digits) [place] Pager [phone] (please enter 7 digits)    Merton Border, MD PCCM service Mobile [phone] Pager [phone] 03/13/2016

## 2016-03-13 NOTE — Progress Notes (Signed)
Regina Burke for constipation management    Allergies  Allergen Reactions  . Ciprofloxacin Shortness Of Breath, Itching and Rash  . Doxycycline Shortness Of Breath, Itching and Rash  . Penicillins Shortness Of Breath, Itching, Rash and Other (See Comments)    Has patient had a PCN reaction causing immediate rash, facial/tongue/throat swelling, SOB or lightheadedness with hypotension: Yes Has patient had a PCN reaction causing severe rash involving mucus membranes or skin necrosis: No Has patient had a PCN reaction that required hospitalization No Has patient had a PCN reaction occurring within the last 10 years: No If all of the above answers are "NO", then may proceed with Cephalosporin use.  . Sulfa Antibiotics Shortness Of Breath, Itching and Rash  . Morphine And Related Itching  . Albuterol Sulfate   . Cefuroxime Rash    blisters    Patient Measurements: Height: '5\' 5"'$  (165.1 cm) Weight: 176 lb 4.8 oz (79.969 kg) IBW/kg (Calculated) : 57  Vital Signs: Temp: 97.6 F (36.4 C) (07/17 1216) Temp Source: Oral (07/17 1216) BP: 124/72 mmHg (07/17 1216) Pulse Rate: 98 (07/17 1216) Intake/Output from previous day: 07/16 0701 - 07/17 0700 In: 360 [P.O.:360] Out: 1125 [Urine:1125] Intake/Output from this shift: Total I/O In: -  Out: 200 [Urine:200]  Labs:  Recent Labs  03/11/16 0441 03/12/16 0427  WBC 7.2 6.5  HGB 9.8* 9.5*  HCT 28.9* 28.8*  PLT 208 179  CREATININE 1.77* 1.67*   Estimated Creatinine Clearance: 28.1 mL/min (by C-G formula based on Cr of 1.67).   Assessment: Pharmacy consulted for constipation management for 82 yo female ICU patient. Patient has not had documented bowel movement this admission. Patient ordered senna/docusate 2 tabs BID starting 7/12 and received bisacodyl suppository x 1 on 7/13.    Plan:  Continue senna/docusate 1 tab PO BID.    Will assess for bowel movement in am.   7/15: Per RN, per pt  had BM yesterday (7/14). No changes.  7/16: Per chart, pt had BM on 7/15. No changes.   7/17: Per chart last BM today 7/17. Reported BM on 7/16 as well. Will continue with current regimen.   Pharmacy will continue to monitor and adjust per consult.   Nancy Fetter, PharmD Clinical Pharmacist 03/13/2016 12:23 PM

## 2016-03-13 NOTE — Progress Notes (Signed)
Physical Therapy Treatment Patient Details Name: Regina Burke MRN: [id number] DOB: [birth year] Today's Date: 03/13/2016    History of Present Illness Patient is an 82 y/o female that presents after experiencing a-fib exacerbation and severe shortness of breath at home. Initiated on BiPap, amioderone drip. Has now been weaned to HFNC, amio-PO. She was cardioverted yesterday, unfortunately resumed a-fib within 2 hours.     PT Comments    Pt had been resting and was on Bipap upon arrival; pt requests therapist to call nursing to remove. O2 saturation 99% on Bipap. Pt ambulates to the bathroom on 2 liters with noted decrease in saturation to 88-90%. Pt increased to 3 liters for further ambulation and remains at 94% with and post ambulation; placed back on 2 liters post ambulation. Pt does have an increase in heart rate up to 134 beats per minute with ambulation that resolves to baseline within 2 minutes. Pt notes difficulty with nausea and feeling a little worse post ambulation. Pt requesting medication from nursing. Pt wishes to remain seated edge of bed post session; nursing made aware. Continue PT to progress strength, endurance and improve O2 saturation with activity level to allow for optimal safe return home.   Follow Up Recommendations  Home health PT     Equipment Recommendations       Recommendations for Other Services       Precautions / Restrictions Precautions Precautions: Fall Restrictions Weight Bearing Restrictions: No    Mobility  Bed Mobility Overal bed mobility: Modified Independent                Transfers Overall transfer level: Modified independent Equipment used: Rolling walker (2 wheeled) Transfers: Sit to/from Stand (from bed and commode) Sit to Stand: Modified independent (Device/Increase time)         General transfer comment: Good use of hands; safe.   Ambulation/Gait Ambulation/Gait assistance: Min guard Ambulation Distance (Feet): 230  Feet (initially 25 ft to commode) Assistive device: Rolling walker (2 wheeled) Gait Pattern/deviations: Step-through pattern   Gait velocity interpretation: at or above normal speed for age/gender General Gait Details: With initial ambulation to bathroom on 2L O2; saturation 88-90%. Increased to 3L for further ambulation. Pt takes 1 standing break. O2 level on 3L for ambulation 94%; HR increases to 134 with quick return to 110 with seated rest and 102 within 2 minutes. Pt placed back on 2L with seated rest and O2 saturation remains at 94%   Stairs            Wheelchair Mobility    Modified Rankin (Stroke Patients Only)       Balance Overall balance assessment: Needs assistance Sitting-balance support: Feet supported Sitting balance-Leahy Scale: Good     Standing balance support: Bilateral upper extremity supported Standing balance-Leahy Scale: Fair                      Cognition Arousal/Alertness: Awake/alert Behavior During Therapy: WFL for tasks assessed/performed Overall Cognitive Status: Within Functional Limits for tasks assessed                      Exercises      General Comments        Pertinent Vitals/Pain      Home Living                      Prior Function            PT  Goals (current goals can now be found in the care plan section) Progress towards PT goals: Progressing toward goals    Frequency  Min 2X/week    PT Plan Current plan remains appropriate    Co-evaluation             End of Session Equipment Utilized During Treatment: Gait belt;Oxygen Activity Tolerance: Patient tolerated treatment well;Patient limited by fatigue (HR increases with ambulation) Patient left: Other (comment) (seated edge of bed; nursing aware)     Time: 2409-7353 PT Time Calculation (min) (ACUTE ONLY): 35 min  Charges:  $Gait Training: 8-22 mins $Therapeutic Activity: 8-22 mins                    G Codes:      Charlaine Dalton, PTA 03/13/2016, 4:14 PM

## 2016-03-13 NOTE — Progress Notes (Signed)
[facility]  ROUNDING NOTE   Subjective:  Overall doing better today Serum creatinine yesterday had improved to 1.67/GFR 28 No acute shortness of breath Doing extremity edema has improved    Objective:  Vital signs in last 24 hours:  Temp:  [97.5 F (36.4 C)-98.1 F (36.7 C)] 97.6 F (36.4 C) (07/17 1216) Pulse Rate:  [81-107] 98 (07/17 1216) Resp:  [16-20] 16 (07/17 0555) BP: (117-130)/(50-90) 124/72 mmHg (07/17 1216) SpO2:  [93 %-100 %] 98 % (07/17 1216) FiO2 (%):  [32 %] 32 % (07/16 2236) Weight:  [79.969 kg (176 lb 4.8 oz)] 79.969 kg (176 lb 4.8 oz) (07/17 0423)  Weight change: -6.231 kg (-13 lb 11.8 oz) Filed Weights   03/11/16 0500 03/12/16 0345 03/13/16 0423  Weight: 84.1 kg (185 lb 6.5 oz) 86.2 kg (190 lb 0.6 oz) 79.969 kg (176 lb 4.8 oz)    Intake/Output: I/O last 3 completed shifts: In: 480 [P.O.:480] Out: 1625 [Urine:1625]   Intake/Output this shift:  Total I/O In: 240 [P.O.:240] Out: 400 [Urine:400]  Physical Exam: General: Sitting up in bed  Head: [place]/AT  Eyes: Anicteric,  Neck: Supple, trachea midline  Lungs:  Clear bilaterally, 2L [place]  Heart: irregular  Abdomen:  Soft, nontender  Extremities:  no peripheral edema.  Neurologic: Nonfocal, moving all four extremities  Skin: No lesions       Basic Metabolic Panel:  Recent Labs Lab 03/07/16 0447 03/08/16 0335 03/09/16 0648 03/10/16 0526 03/11/16 0441 03/12/16 0427  NA 137 139 139 141 140 140  K 4.3 4.1 4.2 3.9 4.0 3.9  CL 96* 96* 96* 97* 98* 98*  CO2 30 31 33* 36* 35* 34*  GLUCOSE 115* 108* 135* 115* 118* 122*  BUN 96* 108* 110* 106* 92* 71*  CREATININE 3.08* 2.75* 2.29* 2.10* 1.77* 1.67*  CALCIUM 8.7* 9.0 8.7* 8.9 8.7* 8.5*  MG  --   --   --  2.4  --   --   PHOS 6.1*  --  5.8* 3.4  --   --     Liver Function Tests:  Recent Labs Lab 03/07/16 0447 03/09/16 0648  ALBUMIN 3.6 3.5   No results for input(s): LIPASE, AMYLASE in the last 168 hours. No results for  input(s): AMMONIA in the last 168 hours.  CBC:  Recent Labs Lab 03/07/16 0447 03/09/16 0648 03/10/16 0526 03/11/16 0441 03/12/16 0427  WBC 8.5 4.9 4.9 7.2 6.5  NEUTROABS  --   --   --  6.4 5.3  HGB 9.5* 9.6* 9.8* 9.8* 9.5*  HCT 28.2* 28.6* 28.7* 28.9* 28.8*  MCV 80.5 79.8* 79.5* 79.2* 80.0  PLT 245 201 206 208 179    Cardiac Enzymes: No results for input(s): CKTOTAL, CKMB, CKMBINDEX, TROPONINI in the last 168 hours.  BNP: Invalid input(s): POCBNP  CBG:  Recent Labs Lab 03/12/16 2023 03/13/16 03/13/16 0421 03/13/16 0734 03/13/16 1217  GLUCAP 172* 130* 115* 143* 160*    Microbiology: Results for orders placed or performed during the hospital encounter of 03/04/16  Blood Culture (routine x 2)     Status: None   Collection Time: 03/04/16  9:40 PM  Result Value Ref Range Status   Specimen Description BLOOD LEFT HAND  Final   Special Requests   Final    BOTTLES DRAWN AEROBIC AND ANAEROBIC 11CCAERO,12CCANA   Culture NO GROWTH 5 DAYS  Final   Report Status 03/09/2016 FINAL  Final  Blood Culture (routine x 2)     Status: None  Collection Time: 03/04/16  9:40 PM  Result Value Ref Range Status   Specimen Description BLOOD RIGHT HAND  Final   Special Requests   Final    BOTTLES DRAWN AEROBIC AND ANAEROBIC [place]   Culture NO GROWTH 5 DAYS  Final   Report Status 03/09/2016 FINAL  Final  MRSA PCR Screening     Status: None   Collection Time: 03/05/16 12:30 AM  Result Value Ref Range Status   MRSA by PCR NEGATIVE NEGATIVE Final    Comment:        The GeneXpert MRSA Assay (FDA approved for NASAL specimens only), is one component of a comprehensive MRSA colonization surveillance program. It is not intended to diagnose MRSA infection nor to guide or monitor treatment for MRSA infections.   Urine culture     Status: None   Collection Time: 03/05/16 10:33 AM  Result Value Ref Range Status   Specimen Description URINE, RANDOM  Final   Special Requests  NONE  Final   Culture NO GROWTH Performed at [facility]   Final   Report Status 03/06/2016 FINAL  Final    Coagulation Studies: No results for input(s): LABPROT, INR in the last 72 hours.  Urinalysis: No results for input(s): COLORURINE, LABSPEC, PHURINE, GLUCOSEU, HGBUR, BILIRUBINUR, KETONESUR, PROTEINUR, UROBILINOGEN, NITRITE, LEUKOCYTESUR in the last 72 hours.  Invalid input(s): APPERANCEUR    Imaging: No results found.   Medications:     . ALPRAZolam  0.25 mg Oral QHS  . antiseptic oral rinse  7 mL Mouth Rinse q12n4p  . chlorhexidine  15 mL Mouth Rinse BID  . diltiazem  240 mg Oral Daily  . famotidine  20 mg Oral QHS  . fluticasone furoate-vilanterol  1 puff Inhalation Daily  . insulin aspart  0-24 Units Subcutaneous Q4H  . levalbuterol  0.63 mg Nebulization Once  . metoprolol tartrate  50 mg Oral BID  . ranolazine  500 mg Oral BID  . rivaroxaban  15 mg Oral Q supper  . senna-docusate  1 tablet Oral BID  . sertraline  50 mg Oral Daily  . tiotropium  18 mcg Inhalation Daily   metoprolol, ondansetron (ZOFRAN) IV  Assessment/ Plan:  Ms. DAYLE SHERPA is a 82 y.o. white female with diabetes mellitus type II, coronary artery disease, atrial fibrillation, hypertension, hyperlipidemia, generalized anxiety disorder, gout, anemia, history of lung cancer, COPD, atrial fibrillation, fatty liver disease, GERD, osteoporosis   1. Acute Renal Failure on Chronic kidney disease stage III with history of hyperkalemia and proteinuria: Baseline creatinine of 1.5, eGFR of 32.  Acute renal failure from cardiorenal syndrome and then overdiuresis.  Currently off ACE-I/ARB  due to hyperkalemia.   Chronic Kidney disease secondary to diabetes and hypertension.  - Monitor potassium level.  Continue to monitor urine output and volume status.    2. Hypertension, atrial fibrillation with rapid ventricular response status post cardioversion however still irregular.  plan as per  cardiology Currently getting Cardizem, metoprolol     LOS: 9 Cherita Hebel 7/17/20172:57 PM

## 2016-03-14 LAB — CBC WITH DIFFERENTIAL/PLATELET
BASOS ABS: 0 10*3/uL (ref 0–0.1)
BASOS PCT: 0 %
EOS ABS: 0.2 10*3/uL (ref 0–0.7)
EOS PCT: 3 %
HCT: 30.2 % — ABNORMAL LOW (ref 35.0–47.0)
Hemoglobin: 9.9 g/dL — ABNORMAL LOW (ref 12.0–16.0)
LYMPHS PCT: 13 %
Lymphs Abs: 0.8 10*3/uL — ABNORMAL LOW (ref 1.0–3.6)
MCH: 26.4 pg (ref 26.0–34.0)
MCHC: 32.8 g/dL (ref 32.0–36.0)
MCV: 80.5 fL (ref 80.0–100.0)
Monocytes Absolute: 0.4 10*3/uL (ref 0.2–0.9)
Monocytes Relative: 7 %
Neutro Abs: 4.7 10*3/uL (ref 1.4–6.5)
Neutrophils Relative %: 77 %
PLATELETS: 172 10*3/uL (ref 150–440)
RBC: 3.75 MIL/uL — AB (ref 3.80–5.20)
RDW: 16.7 % — ABNORMAL HIGH (ref 11.5–14.5)
WBC: 6.1 10*3/uL (ref 3.6–11.0)

## 2016-03-14 LAB — BASIC METABOLIC PANEL
ANION GAP: 7 (ref 5–15)
BUN: 53 mg/dL — ABNORMAL HIGH (ref 6–20)
CO2: 32 mmol/L (ref 22–32)
Calcium: 8.5 mg/dL — ABNORMAL LOW (ref 8.9–10.3)
Chloride: 102 mmol/L (ref 101–111)
Creatinine, Ser: 1.48 mg/dL — ABNORMAL HIGH (ref 0.44–1.00)
GFR, EST AFRICAN AMERICAN: 37 mL/min — AB (ref >60)
GFR, EST NON AFRICAN AMERICAN: 32 mL/min — AB (ref >60)
Glucose, Bld: 117 mg/dL — ABNORMAL HIGH (ref 65–99)
POTASSIUM: 4.3 mmol/L (ref 3.5–5.1)
SODIUM: 141 mmol/L (ref 135–145)

## 2016-03-14 LAB — GLUCOSE, CAPILLARY
GLUCOSE-CAPILLARY: 116 mg/dL — AB (ref 65–99)
GLUCOSE-CAPILLARY: 109 mg/dL — AB (ref 65–99)
GLUCOSE-CAPILLARY: 184 mg/dL — AB (ref 65–99)

## 2016-03-14 MED ORDER — FUROSEMIDE 40 MG PO TABS: 20.0000 mg | ORAL_TABLET | Freq: Two times a day (BID) | ORAL | Status: DC

## 2016-03-14 NOTE — Discharge Summary (Signed)
Physician Discharge Summary  Patient ID: Regina Burke MRN: [id number] DOB/AGE: 1933-05-11 82 y.o.  Admit date: 03/04/2016 Discharge date: 03/14/2016    Discharge Diagnoses:   Acute on chronic respiratory failure CHF exacerbation History of Sleep apnea Hx of right upper lobe cancer                                                                 Acute on chronic respiratory failure     DISCHARGE PLAN BY DIAGNOSIS   Continue home oxygen Resume home spiriva/ breo   Diasolic CHF  Atrial fibrillation Hypertension Hyperlipidemia  Resume home dose amiodarone /atorvastatin/metoprolol/lasix                DISCHARGE SUMMARY   Regina Burke is a 82 year old female with past medical history significant for COPD on home oxygen, chronic diastolic congestive heart failure, hyperlipidemia, hypertension, sleep apnea, type 2 diabetes, GERD, depression, gout, chronic kidney disease stage III, lung cancer (diagnosed in 2014) and paroxysmal atrial fibrillation. Patient presented to ED on 03/04/16 via EMS due to respiratory distress .  Her chest X-ray was concerning for bilateral lower lobe opacity likely related t ao CHF. Patient was placed on BIPAP and was monitored closely in ICU. Patient was noted to be in afib with rate of 140-150's. Patient was started on antiarrythmic but her respiratory status did not show significant improvement and therefore patient was cardioverted on 03/08/16. Patient was in [place] for short duration of time and was back in afib in less than 2 hours..  Therefore the goal became to keep her rate controlled with diltiazem and metoprolol. She is n Xarelto for anticoagulation.Patient requires a a BiPAP at home.    Patient will qualify for BiPAP only if she fails sleep study.  Therefore follow up appointments have been made for her to get her seen by Dr. Mortimer Fries in 2-3 weeks for sleep study.              SIGNIFICANT DIAGNOSTIC STUDIES none  SIGNIFICANT EVENTS 20/60 >>82 year old  female with various comorbidities now presenting to the ED bilateral lower lob opacitiesrequiring BiPAP 7/12>> cardioversion  MICRO DATA  7/8 blood cultures >> negative 7/18 urine culture >>negative 7/8 sputum culture >>negative  ANTIBIOTICS 7/8 vancomycin>>7/8 7/8 azactam>>7/8  CONSULTS 7/9 cardiology 7/12 palliative care  TUBES / LINES none   Discharge Exam: General:Elderly white female, resting in bed, in NAD. Neuro: A&O x 3, non-focal.  HEENT: [place]/AT. PERRL, sclerae anicteric. Cardiovascular: RRR, no M/R/G.  Lungs: Respirations even and unlabored.  CTA bilaterally, No W/R/R. Abdomen: BS x 4, soft, NT/ND.  Musculoskeletal: No gross deformities, no edema.  Skin: Intact, warm, no rashes.   Filed Vitals:   03/14/16 0439 03/14/16 0508 03/14/16 1038 03/14/16 1108  BP:  116/89 116/70   Pulse:  95 109 121  Temp:  97.5 F (36.4 C)    TempSrc:  Oral    Resp:  20    Height:      Weight: 179 lb 2 oz (81.25 kg)     SpO2:  98%  95%     Discharge Labs  BMET  Recent Labs Lab 03/09/16 0648 03/10/16 0526 03/11/16 0441 03/12/16 0427 03/14/16 0451  NA 139 141 140 140 141  K 4.2  3.9 4.0 3.9 4.3  CL 96* 97* 98* 98* 102  CO2 33* 36* 35* 34* 32  GLUCOSE 135* 115* 118* 122* 117*  BUN 110* 106* 92* 71* 53*  CREATININE 2.29* 2.10* 1.77* 1.67* 1.48*  CALCIUM 8.7* 8.9 8.7* 8.5* 8.5*  MG  --  2.4  --   --   --   PHOS 5.8* 3.4  --   --   --     CBC  Recent Labs Lab 03/11/16 0441 03/12/16 0427 03/14/16 0451  HGB 9.8* 9.5* 9.9*  HCT 28.9* 28.8* 30.2*  WBC 7.2 6.5 6.1  PLT 208 179 172    Anti-Coagulation  Recent Labs Lab 03/10/16 0526  INR 1.71    Discharge Instructions    AMB referral to CHF clinic    Complete by:  As directed                 Follow-up Information    Follow up with Alisa Graff, FNP. Go on 03/22/2016.   Specialty:  Family Medicine   Why:  at 12:30pm, to the [facility]   Contact information:   [place] [address] [place] [address] [phone]       Follow up with Flora Lipps, MD. Go on 04/17/2016.   Specialties:  Pulmonary Disease, Cardiology   Why:  Go on 04/17/16 for a follow up at 10:15am with Dr. Mortimer Fries.   Contact information:   [place] [address] [phone]          Medication List    TAKE these medications        ALPRAZolam 0.25 MG tablet  Commonly known as:  XANAX  Take 0.25 mg by mouth 2 (two) times daily as needed for anxiety.     amiodarone 400 MG tablet  Commonly known as:  PACERONE  Take 1 tablet (400 mg total) by mouth 2 (two) times daily.     atorvastatin 20 MG tablet  Commonly known as:  LIPITOR  Take 1 tablet (20 mg total) by mouth every evening.     BREO ELLIPTA 100-25 MCG/INH Aepb  Generic drug:  fluticasone furoate-vilanterol  Inhale 1 puff into the lungs daily.     cholecalciferol 400 units Tabs tablet  Commonly known as:  VITAMIN D  Take 800 Units by mouth every evening.     cyanocobalamin 500 MCG tablet  Take 500 mcg by mouth every evening.     diltiazem 240 MG 24 hr capsule  Commonly known as:  CARDIZEM CD  Take 1 capsule (240 mg total) by mouth every evening.     febuxostat 40 MG tablet  Commonly known as:  ULORIC  Take 40 mg by mouth every evening.     fenofibrate 48 MG tablet  Commonly known as:  TRICOR  Take 48 mg by mouth every evening.     ferrous sulfate 325 (65 FE) MG EC tablet  Take 325 mg by mouth every evening.     furosemide 20 MG tablet  Commonly known as:  LASIX  Take 20 mg by mouth daily.     nitroGLYCERIN 0.4 MG SL tablet  Commonly known as:  NITROSTAT  Place 1 tablet (0.4 mg total) under the tongue every 5 (five) minutes as needed for chest pain.     pantoprazole 40 MG tablet  Commonly known as:  PROTONIX  Take 40 mg by mouth every evening.     Rivaroxaban 15 MG  Tabs tablet  Commonly known as:  XARELTO  Take 1 tablet (15 mg total) by mouth daily with supper.      sertraline 50 MG tablet  Commonly known as:  ZOLOFT  Take 50 mg by mouth every evening.     tiotropium 18 MCG inhalation capsule  Commonly known as:  SPIRIVA  Place 18 mcg into inhaler and inhale daily.     TOPROL XL 25 MG 24 hr tablet  Generic drug:  metoprolol succinate  Take 50 mg by mouth 2 (two) times daily.         Disposition: Home  Discharged Condition: Regina Burke has met maximum benefit of inpatient care and is medically stable and cleared for discharge.  Patient is pending follow up as above.      Time spent on disposition:  Greater than 45 minutes.      Bincy Varughese,AG-ACNP Pulmonary & Critical Care   Pt seen with ACNP Varughese. Agree with above  Merton Border, MD PCCM service Mobile [phone] Pager [phone] 03/15/2016

## 2016-03-14 NOTE — Progress Notes (Signed)
Patient discharge teaching given, including activity, diet, follow-up appoints, and medications. Patient verbalized understanding of all discharge instructions. IV access and central telemetry was d/c'd. Vitals are stable. Skin is intact except as charted in most recent assessments. Pt has decided to not go home with a BiPAP and wait until the sleep study has been done. Pt to be escorted out by volunteer, to be driven home by family.  Regina Burke

## 2016-03-14 NOTE — Progress Notes (Signed)
Pt calling out multiple times anxious about the BIPAP and how it fits. Jeff from Respiratory called several times and readjusted the mask. Active listening and teaching performed by both Respiratory and Nursing. Pt now wearing 02 at 2L, sats 99%. Villa Herb RN-BC

## 2016-03-14 NOTE — Progress Notes (Signed)
BiPAP removed and placed on standby at this time. Patient restless and agitated not tolerating BiPAP in spite of readjusting mask multiple times along with coaching and encouragement. O2 in use via 2LPM Nasal Cannula

## 2016-03-14 NOTE — Progress Notes (Signed)
Physical Therapy Treatment Patient Details Name: Regina Burke MRN: [id number] DOB: 1933-02-03 Today's Date: 03/14/2016    History of Present Illness Patient is an 82 y/o female that presents after experiencing a-fib exacerbation and severe shortness of breath at home. Initiated on BiPap, amioderone drip. Has now been weaned to HFNC, amio-PO. She was cardioverted yesterday, unfortunately resumed a-fib within 2 hours.     PT Comments    PT notes fatigue from unrestful night and continues with complains of nausea. Pt does not wish out of bed; HR at rest also noted to be 110-124 beats per minute. Pt does participate in brief supine exercise session with HR increasing to 134 beats per minute at times requiring rest between sets of 10 to manage. Continue PT to progress strength and endurance for improved functional mobility to allow safe return home post acute care stay.   Follow Up Recommendations  Home health PT     Equipment Recommendations       Recommendations for Other Services       Precautions / Restrictions Precautions Precautions: Fall Restrictions Weight Bearing Restrictions: No    Mobility  Bed Mobility               General bed mobility comments: Pt did not wish out of bed this morning; HR also in range of 110 - 124 beats per minute at rest.   Transfers                    Ambulation/Gait                 Stairs            Wheelchair Mobility    Modified Rankin (Stroke Patients Only)       Balance                                    Cognition Arousal/Alertness: Awake/alert (c/o fatigue from unrestful night) Behavior During Therapy: WFL for tasks assessed/performed Overall Cognitive Status: Within Functional Limits for tasks assessed                      Exercises General Exercises - Lower Extremity Ankle Circles/Pumps: AROM;Both;20 reps;Supine Quad Sets: Strengthening;Both;20 reps;Supine Gluteal  Sets: Strengthening;Both;20 reps;Supine Short Arc Quad: AROM;Both;20 reps;Supine Heel Slides: AROM;Both;Supine;10 reps Hip ABduction/ADduction: AROM;Both;Supine;10 reps Other Exercises Other Exercises: HR between 118 and 134 with exertion Other Exercises: Requires rest between exercise sets to manage HR.    General Comments        Pertinent Vitals/Pain Pain Assessment: No/denies pain    Home Living                      Prior Function            PT Goals (current goals can now be found in the care plan section) Progress towards PT goals: Progressing toward goals    Frequency  Min 2X/week    PT Plan Current plan remains appropriate    Co-evaluation             End of Session Equipment Utilized During Treatment: Oxygen Activity Tolerance: Patient tolerated treatment well;Patient limited by fatigue Patient left: in bed;with call bell/phone within reach;with bed alarm set     Time: 7062-3762 PT Time Calculation (min) (ACUTE ONLY): 17 min  Charges:  $Therapeutic Exercise: 8-22 mins  G CodesCharlaine Dalton, PTA 03/14/2016, 11:25 AM

## 2016-03-14 NOTE — Care Management (Signed)
Patient to discharge home today.  Patient open with home health through [facility].  Home health order placed for RN, PT, aide, and resp care.  Patient appropriate for for [place].  Discussed cased with my leadership with Deveron Furlong and with Corene Cornea from [facility].    Patient did not meet medical necessity to qualify for insurance to cover for home BIPAP.  OSA has not been ruled out, and CPAP has not been trailed in medical setting.  I have notified Dr. Alva Garnet of this information.  Dr Alva Garnet stated "we will just send her home with what she came in with and arrange it out patient".  I notified patient that patient would need an outpatient sleep study arranged.  Option for patient to pay out of pocket for $244 plus supplies monthly for BIPAP, until sleep study completed.  Simonds states "we will set it up outpatient"  Patient and son were offered the option of paying out of pocket.  They declined at discharge. Patient states "i have a CPAP in my closet at home, but I hate it and will not use it"  Team is aware that patient will be discharging on chronic home O2 of 2 liters.  Follow up appointment has been made for pulmonology.  While I was in the room, patient's son called pulmonology at had the appointment moved up to 03/23/16. RNCM signing off

## 2016-03-14 NOTE — Progress Notes (Signed)
[facility]  ROUNDING NOTE   Subjective:  Doing fair today Feels nauseous Work with physical therapy but got short of breath with mild exertion No vomiting Serum creatinine improved to 148/GFR 32    Objective:  Vital signs in last 24 hours:  Temp:  [97.5 F (36.4 C)-98.6 F (37 C)] 97.5 F (36.4 C) (07/18 0508) Pulse Rate:  [95-121] 121 (07/18 1108) Resp:  [20-21] 20 (07/18 0508) BP: (106-116)/(70-89) 116/70 mmHg (07/18 1038) SpO2:  [95 %-99 %] 95 % (07/18 1108) Weight:  [81.25 kg (179 lb 2 oz)] 81.25 kg (179 lb 2 oz) (07/18 0439)  Weight change: 1.281 kg (2 lb 13.2 oz) Filed Weights   03/12/16 0345 03/13/16 0423 03/14/16 0439  Weight: 86.2 kg (190 lb 0.6 oz) 79.969 kg (176 lb 4.8 oz) 81.25 kg (179 lb 2 oz)    Intake/Output: I/O last 3 completed shifts: In: 600 [P.O.:600] Out: 851 [Urine:851]   Intake/Output this shift:  Total I/O In: -  Out: 100 [Urine:100]  Physical Exam: General: Sitting up in bed  Head: [place]/AT  Eyes: Anicteric,  Neck: Supple, trachea midline  Lungs:  Crackles bilaterally, 2L [place]  Heart: Irregular, A Fib  Abdomen:  Soft, nontender  Extremities:  trace peripheral edema.  Neurologic: Nonfocal, moving all four extremities  Skin: No lesions       Basic Metabolic Panel:  Recent Labs Lab 03/09/16 0648 03/10/16 0526 03/11/16 0441 03/12/16 0427 03/14/16 0451  NA 139 141 140 140 141  K 4.2 3.9 4.0 3.9 4.3  CL 96* 97* 98* 98* 102  CO2 33* 36* 35* 34* 32  GLUCOSE 135* 115* 118* 122* 117*  BUN 110* 106* 92* 71* 53*  CREATININE 2.29* 2.10* 1.77* 1.67* 1.48*  CALCIUM 8.7* 8.9 8.7* 8.5* 8.5*  MG  --  2.4  --   --   --   PHOS 5.8* 3.4  --   --   --     Liver Function Tests:  Recent Labs Lab 03/09/16 0648  ALBUMIN 3.5   No results for input(s): LIPASE, AMYLASE in the last 168 hours. No results for input(s): AMMONIA in the last 168 hours.  CBC:  Recent Labs Lab 03/09/16 0648 03/10/16 0526 03/11/16 0441  03/12/16 0427 03/14/16 0451  WBC 4.9 4.9 7.2 6.5 6.1  NEUTROABS  --   --  6.4 5.3 4.7  HGB 9.6* 9.8* 9.8* 9.5* 9.9*  HCT 28.6* 28.7* 28.9* 28.8* 30.2*  MCV 79.8* 79.5* 79.2* 80.0 80.5  PLT 201 206 208 179 172    Cardiac Enzymes: No results for input(s): CKTOTAL, CKMB, CKMBINDEX, TROPONINI in the last 168 hours.  BNP: Invalid input(s): POCBNP  CBG:  Recent Labs Lab 03/13/16 2009 03/13/16 2316 03/14/16 0421 03/14/16 0736 03/14/16 1130  GLUCAP 182* 111* 116* 109* 184*    Microbiology: Results for orders placed or performed during the hospital encounter of 03/04/16  Blood Culture (routine x 2)     Status: None   Collection Time: 03/04/16  9:40 PM  Result Value Ref Range Status   Specimen Description BLOOD LEFT HAND  Final   Special Requests   Final    BOTTLES DRAWN AEROBIC AND ANAEROBIC 11CCAERO,12CCANA   Culture NO GROWTH 5 DAYS  Final   Report Status 03/09/2016 FINAL  Final  Blood Culture (routine x 2)     Status: None   Collection Time: 03/04/16  9:40 PM  Result Value Ref Range Status   Specimen Description BLOOD RIGHT HAND  Final  Special Requests   Final    BOTTLES DRAWN AEROBIC AND ANAEROBIC [place]   Culture NO GROWTH 5 DAYS  Final   Report Status 03/09/2016 FINAL  Final  MRSA PCR Screening     Status: None   Collection Time: 03/05/16 12:30 AM  Result Value Ref Range Status   MRSA by PCR NEGATIVE NEGATIVE Final    Comment:        The GeneXpert MRSA Assay (FDA approved for NASAL specimens only), is one component of a comprehensive MRSA colonization surveillance program. It is not intended to diagnose MRSA infection nor to guide or monitor treatment for MRSA infections.   Urine culture     Status: None   Collection Time: 03/05/16 10:33 AM  Result Value Ref Range Status   Specimen Description URINE, RANDOM  Final   Special Requests NONE  Final   Culture NO GROWTH Performed at [facility]   Final   Report Status 03/06/2016 FINAL   Final    Coagulation Studies: No results for input(s): LABPROT, INR in the last 72 hours.  Urinalysis: No results for input(s): COLORURINE, LABSPEC, PHURINE, GLUCOSEU, HGBUR, BILIRUBINUR, KETONESUR, PROTEINUR, UROBILINOGEN, NITRITE, LEUKOCYTESUR in the last 72 hours.  Invalid input(s): APPERANCEUR    Imaging: No results found.   Medications:     . ALPRAZolam  0.25 mg Oral QHS  . antiseptic oral rinse  7 mL Mouth Rinse q12n4p  . chlorhexidine  15 mL Mouth Rinse BID  . diltiazem  240 mg Oral Daily  . famotidine  20 mg Oral QHS  . fluticasone furoate-vilanterol  1 puff Inhalation Daily  . insulin aspart  0-24 Units Subcutaneous Q4H  . levalbuterol  0.63 mg Nebulization Once  . metoprolol tartrate  50 mg Oral BID  . ranolazine  500 mg Oral BID  . rivaroxaban  15 mg Oral Q supper  . senna-docusate  1 tablet Oral BID  . sertraline  50 mg Oral Daily  . tiotropium  18 mcg Inhalation Daily   metoprolol, ondansetron (ZOFRAN) IV  Assessment/ Plan:  Ms. JAHNIYAH REVERE is a 82 y.o. white female with diabetes mellitus type II, coronary artery disease, atrial fibrillation, hypertension, hyperlipidemia, generalized anxiety disorder, gout, anemia, history of lung cancer, COPD, atrial fibrillation, fatty liver disease, GERD, osteoporosis   1. Acute Renal Failure on Chronic kidney disease stage III with history of hyperkalemia and proteinuria: Baseline creatinine of 1.5, eGFR of 32.  Acute renal failure from cardiorenal syndrome and then overdiuresis.  Currently off ACE-I/ARB  due to hyperkalemia.   Chronic Kidney disease secondary to diabetes and hypertension.  - Monitor potassium level.  Continue to monitor urine output and volume status.  - start low dose lasix for worseninig pulm edema and lung exam   2. Hypertension, atrial fibrillation with rapid ventricular response status post cardioversion however still irregular.  plan as per cardiology Currently getting Cardizem,  metoprolol     LOS: 10 [place] Didonato 7/18/201712:44 PM

## 2016-03-14 NOTE — Progress Notes (Signed)
Initial Nutrition Assessment  DOCUMENTATION CODES:   Obesity unspecified  INTERVENTION:  Monitor intake and cater to pt preferences   NUTRITION DIAGNOSIS:    (none at this time) related to   as evidenced by  .    GOAL:   Patient will meet greater than or equal to 90% of their needs    MONITOR:   PO intake, Weight trends  REASON FOR ASSESSMENT:   LOS    ASSESSMENT:      Pt admitted with ARF on chronic kidney disease stage III, acute on chronic respiratory failure, afib  Past Medical History  Diagnosis Date  . COPD (chronic obstructive pulmonary disease) ([place])   . Chronic diastolic CHF (congestive heart failure) ([place])     a. 10/2015 Echo: EF 55-65%, Gr1 DD, mild MR, mildly dil LA, nl RV fxn, nl PASP.  Marland Kitchen Hypertensive heart disease   . HLD (hyperlipidemia)   . Sleep apnea   . Cushing's disease ([place])   . Type II diabetes mellitus ([place])   . Anemia   . GERD (gastroesophageal reflux disease)   . History of hiatal hernia   . Migraine   . Arthritis   . Gout   . Depression   . Asthma   . Coronary artery disease     a. 11/2014 NSTEMI/PCI: LM nl, LAD 70m D1 30, LCX mild dzs, OM1 20p, OM2 259mOM3 90p (2.25x8 Promus Premier DES), RCA nl.   . HOH (hard of hearing)   . Chronic kidney disease (CKD), stage III (moderate)   . Cough     CHRONIC AT NIGHT  . Edema     FEET/LEGS  . History of pneumonia   . Lung cancer ([facility] 2014    S/P radiation 2015  . Diverticulitis   . Multiple allergies   . PAF (paroxysmal atrial fibrillation) ([facility])     a. on amio, Toprol XL, Cardizem CD, and Eliquis 2.5 mg bid (age & SCr); b. CHADS2VASc ==> 7 (CHF, HTN, age x 2, DM, vascular disease and sex category)  . Hypertension    Pt reports good appetite during admission, ate 100% of breakfast this am.  Noted pt eating 40-100% of meals per documentation  Medications reviewed Labs reviewed: BUN 53, creatinine 1.48, glucose 117  Diet Order:  Diet Carb Modified Fluid consistency::  Thin; Room service appropriate?: Yes  Skin:  Reviewed, no issues  Last BM:  7/17  Height:   Ht Readings from Last 1 Encounters:  03/05/16 '5\' 5"'$  (1.651 m)    Weight: Noted changes in wt since admission, lasix given during admission. Pt reports stable wt prior to admission  Wt Readings from Last 1 Encounters:  03/14/16 179 lb 2 oz (81.25 kg)    Ideal Body Weight:     BMI:  Body mass index is 29.81 kg/(m^2).  Estimated Nutritional Needs:   Kcal:  2025-2430 kcals/d  Protein:  81-97 g/d  Fluid:  >/= 200038m  EDUCATION NEEDS:   No education needs identified at this time  Lupe Handley B. AllZenia ResidesD,SebekaDNHal[place]ager) Weekend/On-Call pager ([phone]

## 2016-03-15 ENCOUNTER — Telehealth: Payer: Self-pay | Admitting: Cardiovascular Disease

## 2016-03-15 NOTE — Telephone Encounter (Signed)
Per Darlina Guys with [facility] pt was placed on CPAP but has not been wearing it.  According to patient, she couldn't tolerate it and didn't wear it. Since patient broke compliance she will have to have another Split Night study done.  "As of now, she is listed as untreated OSA, so we are unable to obtain BiPap due to Resp Failure b/c her OSA is not at a stable state". In order to try to resolve this issue, we need to order a Split Night Study Study. Rhonda J Cobb

## 2016-03-15 NOTE — Telephone Encounter (Signed)
Pt son called, would like a rx for a BiPAP machine. Asks if DR. Rockey Situ can talk with Dr. Stevenson Clinch about pt going ahead and getting this rx. Please call.

## 2016-03-15 NOTE — Telephone Encounter (Signed)
Her payer would not pay for/approve BiPAP. She needs to "fail" CPAP first. She already has had sleep study and titration study.  She is DK pt and should have F/U arranged with him. (If not, this is the patient about whom that message was sent to you yesterday where I forgot to tag the patient)  Merton Border, MD PCCM service Mobile [phone] Pager [phone] 03/15/2016

## 2016-03-15 NOTE — Telephone Encounter (Signed)
Received message from [facility] that pt didn't go home with BiPAP machine at d/c. Pt is scheduled for hosp f/u on 7/27. Can I go ahead and order split night so that we can get started for the process to get pt a BiPAP. Son has called asking if we can get this started.

## 2016-03-16 ENCOUNTER — Inpatient Hospital Stay
Admission: EM | Admit: 2016-03-16 | Discharge: 2016-03-30 | DRG: 291 | Disposition: A | Discharge status: Other Acute Inpatient Hospital | Payer: Medicare Other | Attending: Internal Medicine | Admitting: Internal Medicine

## 2016-03-16 ENCOUNTER — Encounter: Payer: Self-pay | Admitting: Emergency Medicine

## 2016-03-16 ENCOUNTER — Emergency Department: Payer: Medicare Other

## 2016-03-16 ENCOUNTER — Inpatient Hospital Stay: Payer: Medicare Other

## 2016-03-16 DIAGNOSIS — R0600 Dyspnea, unspecified: Secondary | ICD-10-CM

## 2016-03-16 DIAGNOSIS — I5043 Acute on chronic combined systolic (congestive) and diastolic (congestive) heart failure: Secondary | ICD-10-CM | POA: Diagnosis present

## 2016-03-16 DIAGNOSIS — J441 Chronic obstructive pulmonary disease with (acute) exacerbation: Secondary | ICD-10-CM | POA: Diagnosis present

## 2016-03-16 DIAGNOSIS — N179 Acute kidney failure, unspecified: Secondary | ICD-10-CM | POA: Diagnosis present

## 2016-03-16 DIAGNOSIS — J432 Centrilobular emphysema: Secondary | ICD-10-CM | POA: Diagnosis not present

## 2016-03-16 DIAGNOSIS — J9621 Acute and chronic respiratory failure with hypoxia: Secondary | ICD-10-CM | POA: Diagnosis present

## 2016-03-16 DIAGNOSIS — Z87891 Personal history of nicotine dependence: Secondary | ICD-10-CM

## 2016-03-16 DIAGNOSIS — Z7901 Long term (current) use of anticoagulants: Secondary | ICD-10-CM

## 2016-03-16 DIAGNOSIS — R0602 Shortness of breath: Secondary | ICD-10-CM | POA: Diagnosis present

## 2016-03-16 DIAGNOSIS — D631 Anemia in chronic kidney disease: Secondary | ICD-10-CM | POA: Diagnosis present

## 2016-03-16 DIAGNOSIS — Z885 Allergy status to narcotic agent status: Secondary | ICD-10-CM

## 2016-03-16 DIAGNOSIS — I2510 Atherosclerotic heart disease of native coronary artery without angina pectoris: Secondary | ICD-10-CM | POA: Diagnosis present

## 2016-03-16 DIAGNOSIS — F411 Generalized anxiety disorder: Secondary | ICD-10-CM | POA: Diagnosis present

## 2016-03-16 DIAGNOSIS — D509 Iron deficiency anemia, unspecified: Secondary | ICD-10-CM | POA: Diagnosis present

## 2016-03-16 DIAGNOSIS — J9600 Acute respiratory failure, unspecified whether with hypoxia or hypercapnia: Secondary | ICD-10-CM

## 2016-03-16 DIAGNOSIS — Z8249 Family history of ischemic heart disease and other diseases of the circulatory system: Secondary | ICD-10-CM

## 2016-03-16 DIAGNOSIS — I959 Hypotension, unspecified: Secondary | ICD-10-CM | POA: Diagnosis not present

## 2016-03-16 DIAGNOSIS — Z882 Allergy status to sulfonamides status: Secondary | ICD-10-CM

## 2016-03-16 DIAGNOSIS — Z923 Personal history of irradiation: Secondary | ICD-10-CM

## 2016-03-16 DIAGNOSIS — K760 Fatty (change of) liver, not elsewhere classified: Secondary | ICD-10-CM | POA: Diagnosis present

## 2016-03-16 DIAGNOSIS — Y92239 Unspecified place in hospital as the place of occurrence of the external cause: Secondary | ICD-10-CM | POA: Diagnosis not present

## 2016-03-16 DIAGNOSIS — I480 Paroxysmal atrial fibrillation: Secondary | ICD-10-CM | POA: Diagnosis not present

## 2016-03-16 DIAGNOSIS — Z85118 Personal history of other malignant neoplasm of bronchus and lung: Secondary | ICD-10-CM

## 2016-03-16 DIAGNOSIS — I481 Persistent atrial fibrillation: Secondary | ICD-10-CM | POA: Diagnosis present

## 2016-03-16 DIAGNOSIS — J9622 Acute and chronic respiratory failure with hypercapnia: Secondary | ICD-10-CM | POA: Diagnosis not present

## 2016-03-16 DIAGNOSIS — E1122 Type 2 diabetes mellitus with diabetic chronic kidney disease: Secondary | ICD-10-CM | POA: Diagnosis present

## 2016-03-16 DIAGNOSIS — I252 Old myocardial infarction: Secondary | ICD-10-CM | POA: Diagnosis not present

## 2016-03-16 DIAGNOSIS — Z888 Allergy status to other drugs, medicaments and biological substances status: Secondary | ICD-10-CM

## 2016-03-16 DIAGNOSIS — I4891 Unspecified atrial fibrillation: Secondary | ICD-10-CM | POA: Diagnosis not present

## 2016-03-16 DIAGNOSIS — G4733 Obstructive sleep apnea (adult) (pediatric): Secondary | ICD-10-CM | POA: Diagnosis present

## 2016-03-16 DIAGNOSIS — Z8701 Personal history of pneumonia (recurrent): Secondary | ICD-10-CM

## 2016-03-16 DIAGNOSIS — I420 Dilated cardiomyopathy: Secondary | ICD-10-CM | POA: Diagnosis present

## 2016-03-16 DIAGNOSIS — E785 Hyperlipidemia, unspecified: Secondary | ICD-10-CM | POA: Diagnosis present

## 2016-03-16 DIAGNOSIS — T501X5A Adverse effect of loop [high-ceiling] diuretics, initial encounter: Secondary | ICD-10-CM | POA: Diagnosis not present

## 2016-03-16 DIAGNOSIS — I272 Other secondary pulmonary hypertension: Secondary | ICD-10-CM | POA: Diagnosis present

## 2016-03-16 DIAGNOSIS — Z79899 Other long term (current) drug therapy: Secondary | ICD-10-CM

## 2016-03-16 DIAGNOSIS — I5033 Acute on chronic diastolic (congestive) heart failure: Secondary | ICD-10-CM | POA: Diagnosis not present

## 2016-03-16 DIAGNOSIS — K219 Gastro-esophageal reflux disease without esophagitis: Secondary | ICD-10-CM | POA: Diagnosis not present

## 2016-03-16 DIAGNOSIS — N183 Chronic kidney disease, stage 3 (moderate): Secondary | ICD-10-CM | POA: Diagnosis present

## 2016-03-16 DIAGNOSIS — Z880 Allergy status to penicillin: Secondary | ICD-10-CM

## 2016-03-16 DIAGNOSIS — I130 Hypertensive heart and chronic kidney disease with heart failure and stage 1 through stage 4 chronic kidney disease, or unspecified chronic kidney disease: Secondary | ICD-10-CM | POA: Diagnosis present

## 2016-03-16 DIAGNOSIS — I447 Left bundle-branch block, unspecified: Secondary | ICD-10-CM | POA: Diagnosis present

## 2016-03-16 DIAGNOSIS — J9601 Acute respiratory failure with hypoxia: Secondary | ICD-10-CM

## 2016-03-16 DIAGNOSIS — J9690 Respiratory failure, unspecified, unspecified whether with hypoxia or hypercapnia: Secondary | ICD-10-CM

## 2016-03-16 DIAGNOSIS — E249 Cushing's syndrome, unspecified: Secondary | ICD-10-CM | POA: Diagnosis present

## 2016-03-16 DIAGNOSIS — Z9981 Dependence on supplemental oxygen: Secondary | ICD-10-CM

## 2016-03-16 DIAGNOSIS — F329 Major depressive disorder, single episode, unspecified: Secondary | ICD-10-CM | POA: Diagnosis not present

## 2016-03-16 DIAGNOSIS — E876 Hypokalemia: Secondary | ICD-10-CM | POA: Diagnosis not present

## 2016-03-16 DIAGNOSIS — Z4659 Encounter for fitting and adjustment of other gastrointestinal appliance and device: Secondary | ICD-10-CM

## 2016-03-16 DIAGNOSIS — I4819 Other persistent atrial fibrillation: Secondary | ICD-10-CM | POA: Diagnosis not present

## 2016-03-16 DIAGNOSIS — Z955 Presence of coronary angioplasty implant and graft: Secondary | ICD-10-CM

## 2016-03-16 DIAGNOSIS — I428 Other cardiomyopathies: Secondary | ICD-10-CM | POA: Diagnosis not present

## 2016-03-16 DIAGNOSIS — M109 Gout, unspecified: Secondary | ICD-10-CM | POA: Diagnosis not present

## 2016-03-16 DIAGNOSIS — Z825 Family history of asthma and other chronic lower respiratory diseases: Secondary | ICD-10-CM

## 2016-03-16 DIAGNOSIS — Z833 Family history of diabetes mellitus: Secondary | ICD-10-CM

## 2016-03-16 DIAGNOSIS — I360 Nonrheumatic tricuspid (valve) stenosis: Secondary | ICD-10-CM | POA: Diagnosis not present

## 2016-03-16 DIAGNOSIS — M1990 Unspecified osteoarthritis, unspecified site: Secondary | ICD-10-CM | POA: Diagnosis present

## 2016-03-16 DIAGNOSIS — I5032 Chronic diastolic (congestive) heart failure: Secondary | ICD-10-CM | POA: Diagnosis not present

## 2016-03-16 DIAGNOSIS — J811 Chronic pulmonary edema: Secondary | ICD-10-CM

## 2016-03-16 DIAGNOSIS — H9190 Unspecified hearing loss, unspecified ear: Secondary | ICD-10-CM | POA: Diagnosis present

## 2016-03-16 DIAGNOSIS — D500 Iron deficiency anemia secondary to blood loss (chronic): Secondary | ICD-10-CM

## 2016-03-16 DIAGNOSIS — I482 Chronic atrial fibrillation: Secondary | ICD-10-CM | POA: Diagnosis not present

## 2016-03-16 DIAGNOSIS — D649 Anemia, unspecified: Secondary | ICD-10-CM | POA: Diagnosis not present

## 2016-03-16 DIAGNOSIS — M810 Age-related osteoporosis without current pathological fracture: Secondary | ICD-10-CM | POA: Diagnosis present

## 2016-03-16 DIAGNOSIS — I10 Essential (primary) hypertension: Secondary | ICD-10-CM | POA: Diagnosis not present

## 2016-03-16 LAB — COMPREHENSIVE METABOLIC PANEL
ALT: 102 U/L — ABNORMAL HIGH (ref 14–54)
ANION GAP: 8 (ref 5–15)
AST: 57 U/L — ABNORMAL HIGH (ref 15–41)
Albumin: 3.7 g/dL (ref 3.5–5.0)
Alkaline Phosphatase: 56 U/L (ref 38–126)
BUN: 48 mg/dL — ABNORMAL HIGH (ref 6–20)
CALCIUM: 8.5 mg/dL — AB (ref 8.9–10.3)
CHLORIDE: 102 mmol/L (ref 101–111)
CO2: 32 mmol/L (ref 22–32)
Creatinine, Ser: 1.89 mg/dL — ABNORMAL HIGH (ref 0.44–1.00)
GFR calc non Af Amer: 24 mL/min — ABNORMAL LOW (ref >60)
GFR, EST AFRICAN AMERICAN: 28 mL/min — AB (ref >60)
Glucose, Bld: 182 mg/dL — ABNORMAL HIGH (ref 65–99)
Potassium: 4.8 mmol/L (ref 3.5–5.1)
SODIUM: 142 mmol/L (ref 135–145)
Total Bilirubin: 0.8 mg/dL (ref 0.3–1.2)
Total Protein: 6.5 g/dL (ref 6.5–8.1)

## 2016-03-16 LAB — CBC WITH DIFFERENTIAL/PLATELET
BASOS ABS: 0.1 10*3/uL (ref 0–0.1)
BASOS PCT: 1 %
Eosinophils Absolute: 0.2 10*3/uL (ref 0–0.7)
Eosinophils Relative: 2 %
HEMATOCRIT: 33.8 % — AB (ref 35.0–47.0)
HEMOGLOBIN: 10.8 g/dL — AB (ref 12.0–16.0)
Lymphocytes Relative: 10 %
Lymphs Abs: 1.3 10*3/uL (ref 1.0–3.6)
MCH: 26.2 pg (ref 26.0–34.0)
MCHC: 32.1 g/dL (ref 32.0–36.0)
MCV: 81.4 fL (ref 80.0–100.0)
MONOS PCT: 5 %
Monocytes Absolute: 0.7 10*3/uL (ref 0.2–0.9)
NEUTROS ABS: 11.5 10*3/uL — AB (ref 1.4–6.5)
NEUTROS PCT: 82 %
Platelets: 320 10*3/uL (ref 150–440)
RBC: 4.15 MIL/uL (ref 3.80–5.20)
RDW: 16.9 % — ABNORMAL HIGH (ref 11.5–14.5)
WBC: 13.8 10*3/uL — ABNORMAL HIGH (ref 3.6–11.0)

## 2016-03-16 LAB — GLUCOSE, CAPILLARY
Glucose-Capillary: 132 mg/dL — ABNORMAL HIGH (ref 65–99)
Glucose-Capillary: 142 mg/dL — ABNORMAL HIGH (ref 65–99)
Glucose-Capillary: 166 mg/dL — ABNORMAL HIGH (ref 65–99)

## 2016-03-16 LAB — BLOOD GAS, ARTERIAL
O2 SAT: 76.1 %
PH ART: 7.27 — AB (ref 7.350–7.450)
Patient temperature: 37
pCO2 arterial: 78 mmHg (ref 32.0–48.0)
pO2, Arterial: 47 mmHg — ABNORMAL LOW (ref 83.0–108.0)

## 2016-03-16 LAB — PROTIME-INR
INR: 1.76
PROTHROMBIN TIME: 20.5 s — AB (ref 11.4–15.0)

## 2016-03-16 LAB — TROPONIN I

## 2016-03-16 LAB — ABO/RH: ABO/RH(D): A POS

## 2016-03-16 LAB — PHOSPHORUS: Phosphorus: 3.6 mg/dL (ref 2.5–4.6)

## 2016-03-16 LAB — BRAIN NATRIURETIC PEPTIDE: B NATRIURETIC PEPTIDE 5: 1566 pg/mL — AB (ref 0.0–100.0)

## 2016-03-16 LAB — PROCALCITONIN: PROCALCITONIN: 0.13 ng/mL

## 2016-03-16 LAB — FIBRIN DERIVATIVES D-DIMER (ARMC ONLY): FIBRIN DERIVATIVES D-DIMER (ARMC): 1155 — AB (ref 0–499)

## 2016-03-16 LAB — MAGNESIUM
MAGNESIUM: 2.3 mg/dL (ref 1.7–2.4)
Magnesium: 2.4 mg/dL (ref 1.7–2.4)

## 2016-03-16 MED ORDER — DEXTROSE 5 % IV SOLN: 0.0000 ug/min | INTRAVENOUS | Status: DC

## 2016-03-16 MED ORDER — NITROGLYCERIN 2 % TD OINT
1.0000 [in_us] | TOPICAL_OINTMENT | Freq: Once | TRANSDERMAL | Status: DC
  Filled 2016-03-16: qty 1

## 2016-03-16 MED ORDER — ANTISEPTIC ORAL RINSE SOLUTION (CORINZ)
7.0000 mL | OROMUCOSAL | Status: DC
  Administered 2016-03-16 – 2016-03-17 (×8): 7 mL via OROMUCOSAL
  Filled 2016-03-16 (×15): qty 7

## 2016-03-16 MED ORDER — SUCCINYLCHOLINE CHLORIDE 20 MG/ML IJ SOLN
INTRAMUSCULAR | Status: AC
  Administered 2016-03-16: 20 mg via INTRAVENOUS
  Filled 2016-03-16: qty 1

## 2016-03-16 MED ORDER — FUROSEMIDE 10 MG/ML IJ SOLN
40.0000 mg | Freq: Once | INTRAMUSCULAR | Status: AC
  Administered 2016-03-16: 40 mg via INTRAVENOUS
  Filled 2016-03-16: qty 4

## 2016-03-16 MED ORDER — AMIODARONE HCL 200 MG PO TABS
400.0000 mg | ORAL_TABLET | Freq: Two times a day (BID) | ORAL | Status: DC
  Administered 2016-03-16: 400 mg
  Filled 2016-03-16: qty 2

## 2016-03-16 MED ORDER — FAMOTIDINE IN NACL 20-0.9 MG/50ML-% IV SOLN
20.0000 mg | Freq: Two times a day (BID) | INTRAVENOUS | Status: DC
  Administered 2016-03-16: 20 mg via INTRAVENOUS
  Filled 2016-03-16 (×2): qty 50

## 2016-03-16 MED ORDER — DILTIAZEM HCL ER COATED BEADS 240 MG PO CP24: 240.0000 mg | ORAL_CAPSULE | Freq: Every day | ORAL | Status: DC

## 2016-03-16 MED ORDER — FENTANYL CITRATE (PF) 100 MCG/2ML IJ SOLN
INTRAMUSCULAR | Status: AC
  Administered 2016-03-16: 50 ug via INTRAVENOUS
  Filled 2016-03-16: qty 2

## 2016-03-16 MED ORDER — PRO-STAT SUGAR FREE PO LIQD
30.0000 mL | Freq: Two times a day (BID) | ORAL | Status: DC
  Administered 2016-03-16: 30 mL

## 2016-03-16 MED ORDER — SUCCINYLCHOLINE CHLORIDE 20 MG/ML IJ SOLN
100.0000 mg | Freq: Once | INTRAMUSCULAR | Status: AC
  Administered 2016-03-16: 20 mg via INTRAVENOUS

## 2016-03-16 MED ORDER — SODIUM CHLORIDE 0.9 % IV SOLN: 250.0000 mL | INTRAVENOUS | Status: DC | PRN

## 2016-03-16 MED ORDER — FENTANYL 2500MCG IN NS 250ML (10MCG/ML) PREMIX INFUSION
25.0000 ug/h | INTRAVENOUS | Status: DC
  Administered 2016-03-16: 25 ug/h via INTRAVENOUS
  Filled 2016-03-16: qty 250

## 2016-03-16 MED ORDER — HEPARIN SODIUM (PORCINE) 5000 UNIT/ML IJ SOLN: 5000.0000 [IU] | Freq: Three times a day (TID) | INTRAMUSCULAR | Status: DC

## 2016-03-16 MED ORDER — HEPARIN (PORCINE) IN NACL 100-0.45 UNIT/ML-% IJ SOLN
1200.0000 [IU]/h | INTRAMUSCULAR | Status: AC
  Administered 2016-03-17: 1100 [IU]/h via INTRAVENOUS
  Administered 2016-03-18: 1200 [IU]/h via INTRAVENOUS
  Filled 2016-03-16 (×3): qty 250

## 2016-03-16 MED ORDER — NITROGLYCERIN 2 % TD OINT: 1.0000 [in_us] | TOPICAL_OINTMENT | Freq: Once | TRANSDERMAL | Status: DC

## 2016-03-16 MED ORDER — VITAL HIGH PROTEIN PO LIQD
1000.0000 mL | ORAL | Status: DC
Start: 2016-03-16 — End: 2016-03-17
  Administered 2016-03-16 (×3)
  Administered 2016-03-16: 1000 mL
  Administered 2016-03-17 (×5)

## 2016-03-16 MED ORDER — ETOMIDATE 2 MG/ML IV SOLN
INTRAVENOUS | Status: AC
  Administered 2016-03-16: 24.4 mg via INTRAVENOUS
  Filled 2016-03-16: qty 10

## 2016-03-16 MED ORDER — FENTANYL BOLUS VIA INFUSION
25.0000 ug | INTRAVENOUS | Status: DC | PRN
  Filled 2016-03-16: qty 25

## 2016-03-16 MED ORDER — NOREPINEPHRINE 4 MG/250ML-% IV SOLN
0.0000 ug/min | INTRAVENOUS | Status: DC
  Administered 2016-03-16: 2 ug/min via INTRAVENOUS
  Filled 2016-03-16: qty 250

## 2016-03-16 MED ORDER — CHLORHEXIDINE GLUCONATE 0.12% ORAL RINSE (MEDLINE KIT)
15.0000 mL | Freq: Two times a day (BID) | OROMUCOSAL | Status: DC
  Administered 2016-03-16 – 2016-03-19 (×3): 15 mL via OROMUCOSAL
  Filled 2016-03-16 (×6): qty 15

## 2016-03-16 MED ORDER — LORAZEPAM 2 MG/ML IJ SOLN
1.0000 mg | Freq: Once | INTRAMUSCULAR | Status: AC
  Administered 2016-03-16: 1 mg via INTRAMUSCULAR

## 2016-03-16 MED ORDER — ETOMIDATE 2 MG/ML IV SOLN
0.3000 mg/kg | Freq: Once | INTRAVENOUS | Status: AC
  Administered 2016-03-16: 24.4 mg via INTRAVENOUS

## 2016-03-16 MED ORDER — IPRATROPIUM BROMIDE 0.02 % IN SOLN: 0.5000 mg | RESPIRATORY_TRACT | Status: DC | PRN

## 2016-03-16 MED ORDER — LORAZEPAM 2 MG/ML IJ SOLN
INTRAMUSCULAR | Status: AC
  Administered 2016-03-16: 1 mg via INTRAMUSCULAR
  Filled 2016-03-16: qty 1

## 2016-03-16 MED ORDER — FENTANYL CITRATE (PF) 100 MCG/2ML IJ SOLN
50.0000 ug | Freq: Once | INTRAMUSCULAR | Status: AC
  Administered 2016-03-16: 50 ug via INTRAVENOUS

## 2016-03-16 MED ORDER — FUROSEMIDE 10 MG/ML IJ SOLN: 40.0000 mg | Freq: Once | INTRAMUSCULAR | Status: DC

## 2016-03-16 NOTE — ED Notes (Signed)
Pt labored breathing, 98% on bi-pap, pt working to breathe. MD notified

## 2016-03-16 NOTE — H&P (Signed)
PULMONARY / CRITICAL CARE MEDICINE   Name: Regina Burke MRN: [id number] DOB: [birth year]    ADMISSION DATE:  03/16/2016 CONSULTATION DATE:  03/16/2016  REFERRING MD:  Dr. Marcelene Butte  CHIEF COMPLAINT:  Shortness of Breath  HISTORY OF PRESENT ILLNESS:   This is a 82 yo female with a PMH of Type II diabetes, anemia, GERD, cushing's disease, asthma, CKD stage III, pneumonia, edema, paroxysmal atrial fibrillation, HTN, lung cancer s/p radiation 2015, CAD, NSTEMI, HTN, diverticulitis, and gout.  She presented to [facility] ER via EMS on 7/20 with c/o worsening shortness of breath. She was recently admitted to the hospital on 03/04/16 and discharged 03/14/16 for CHF exacerbation and Afibb/RVR and was discharged on her normal supplemental oxygen of 3L.  Per EMS they found her on 3L O2 at home with O2 sats 91% and somnolent.  EMS placed her on bipap however she kept attempting to remove the bipap mask.  She further decompensated in the ER and required intubation.  PCCM to admit to ICU for acute on chronic hypoxic hypercapnic respiratory failure secondary to pulmonary edema and mechanical ventilation management.  PAST MEDICAL HISTORY :  She  has a past medical history of COPD (chronic obstructive pulmonary disease) ([place]); Chronic diastolic CHF (congestive heart failure) ([place]); Hypertensive heart disease; HLD (hyperlipidemia); Sleep apnea; Cushing's disease ([place]); Type II diabetes mellitus ([place]); Anemia; GERD (gastroesophageal reflux disease); History of hiatal hernia; Migraine; Arthritis; Gout; Depression; Asthma; Coronary artery disease; HOH (hard of hearing); Chronic kidney disease (CKD), stage III (moderate); Cough; Edema; History of pneumonia; Lung cancer ([place]) (dx'd 2014); Diverticulitis; Multiple allergies; PAF (paroxysmal atrial fibrillation) ([place]); and Hypertension.  PAST SURGICAL HISTORY: She  has past surgical history that includes Adrenalectomy (Left, 1980's); Abdominal hysterectomy; Appendectomy;  Cholecystectomy; Coronary angioplasty with stent (12/23/2014); Tonsillectomy; Fracture surgery; Wrist fracture surgery (Bilateral, ~ 2000); Breast cyst excision (Left); Tubal ligation; left heart catheterization with coronary angiogram (N/A, 12/23/2014); percutaneous coronary stent intervention (pci-s) (12/23/2014); Cataract extraction w/PHACO (Right, 12/28/2015); Cardiac catheterization (N/A, 03/08/2016); and Cardiac catheterization (N/A, 03/07/2016).  Allergies  Allergen Reactions  . Ciprofloxacin Shortness Of Breath, Itching and Rash  . Doxycycline Shortness Of Breath, Itching and Rash  . Penicillins Shortness Of Breath, Itching, Rash and Other (See Comments)    Has patient had a PCN reaction causing immediate rash, facial/tongue/throat swelling, SOB or lightheadedness with hypotension: Yes Has patient had a PCN reaction causing severe rash involving mucus membranes or skin necrosis: No Has patient had a PCN reaction that required hospitalization No Has patient had a PCN reaction occurring within the last 10 years: No If all of the above answers are "NO", then may proceed with Cephalosporin use.  . Sulfa Antibiotics Shortness Of Breath, Itching and Rash  . Morphine And Related Itching  . Albuterol Sulfate   . Cefuroxime Rash    blisters    No current facility-administered medications on file prior to encounter.   Current Outpatient Prescriptions on File Prior to Encounter  Medication Sig  . sertraline (ZOLOFT) 50 MG tablet Take 50 mg by mouth every evening.   Marland Kitchen ALPRAZolam (XANAX) 0.25 MG tablet Take 0.25 mg by mouth 2 (two) times daily as needed for anxiety.  Marland Kitchen amiodarone (PACERONE) 400 MG tablet Take 1 tablet (400 mg total) by mouth 2 (two) times daily.  Marland Kitchen atorvastatin (LIPITOR) 20 MG tablet Take 1 tablet (20 mg total) by mouth every evening.  . cholecalciferol (VITAMIN D) 400 UNITS TABS tablet Take 800 Units by mouth every evening.   Marland Kitchen  cyanocobalamin 500 MCG tablet Take 500 mcg by mouth  every evening.  . diltiazem (CARDIZEM CD) 240 MG 24 hr capsule Take 1 capsule (240 mg total) by mouth every evening.  . febuxostat (ULORIC) 40 MG tablet Take 40 mg by mouth every evening.   . fenofibrate (TRICOR) 48 MG tablet Take 48 mg by mouth every evening.   . ferrous sulfate 325 (65 FE) MG EC tablet Take 325 mg by mouth every evening.   . fluticasone furoate-vilanterol (BREO ELLIPTA) 100-25 MCG/INH AEPB Inhale 1 puff into the lungs daily.  . furosemide (LASIX) 20 MG tablet Take 20 mg by mouth daily.   . metoprolol succinate (TOPROL XL) 25 MG 24 hr tablet Take 50 mg by mouth 2 (two) times daily.   . nitroGLYCERIN (NITROSTAT) 0.4 MG SL tablet Place 1 tablet (0.4 mg total) under the tongue every 5 (five) minutes as needed for chest pain.  . pantoprazole (PROTONIX) 40 MG tablet Take 40 mg by mouth every evening.   . Rivaroxaban (XARELTO) 15 MG TABS tablet Take 1 tablet (15 mg total) by mouth daily with supper.  . tiotropium (SPIRIVA) 18 MCG inhalation capsule Place 18 mcg into inhaler and inhale daily.    FAMILY HISTORY:  Her indicated that her mother is deceased. She indicated that her father is deceased.   SOCIAL HISTORY: She  reports that she quit smoking about 17 years ago. Her smoking use included Cigarettes. She has a 45 pack-year smoking history. She has never used smokeless tobacco. She reports that she does not drink alcohol or use illicit drugs.  REVIEW OF SYSTEMS:   Unable to assess pt intubated  SUBJECTIVE:  Pt intubated and hypotensive.  VITAL SIGNS: BP 92/59 mmHg  Pulse 93  Temp(Src) 97.9 F (36.6 C) (Axillary)  Resp 19  SpO2 97%  HEMODYNAMICS:    VENTILATOR SETTINGS: Vent Mode:  [-] AC FiO2 (%):  [40 %] 40 % Set Rate:  [18 bmp] 18 bmp Vt Set:  [450 mL] 450 mL PEEP:  [5 cmH20] 5 cmH20  INTAKE / OUTPUT:    PHYSICAL EXAMINATION: General:  Chronically ill appearing female, intubated Neuro:  follows commands, bilateral pupils 3 mm reactive HEENT:  Supple, no  JVD Cardiovascular:  Irregular, irregular, no M/R/G Lungs:  Course throughout, even, non labored Abdomen:  +BS x4, obese, soft, non tender, non distended Musculoskeletal:  Normal bulk and tone Skin:  Intact   LABS:  BMET  Recent Labs Lab 03/12/16 0427 03/14/16 0451 03/16/16 1535  NA 140 141 142  K 3.9 4.3 4.8  CL 98* 102 102  CO2 34* 32 32  BUN 71* 53* 48*  CREATININE 1.67* 1.48* 1.89*  GLUCOSE 122* 117* 182*    Electrolytes  Recent Labs Lab 03/10/16 0526  03/12/16 0427 03/14/16 0451 03/16/16 1419 03/16/16 1535  CALCIUM 8.9  < > 8.5* 8.5*  --  8.5*  MG 2.4  --   --   --  2.4  --   PHOS 3.4  --   --   --   --   --   < > = values in this interval not displayed.  CBC  Recent Labs Lab 03/12/16 0427 03/14/16 0451 03/16/16 1419  WBC 6.5 6.1 13.8*  HGB 9.5* 9.9* 10.8*  HCT 28.8* 30.2* 33.8*  PLT 179 172 320    Coag's  Recent Labs Lab 03/10/16 0526 03/16/16 1419  INR 1.71 1.76    Sepsis Markers No results for input(s): LATICACIDVEN, PROCALCITON, O2SATVEN in  the last 168 hours.  ABG  Recent Labs Lab 03/10/16 1100 03/16/16 1404  PHART 7.43 7.27*  PCO2ART 52* 78*  PO2ART 89 47*    Liver Enzymes  Recent Labs Lab 03/16/16 1535  AST 57*  ALT 102*  ALKPHOS 56  BILITOT 0.8  ALBUMIN 3.7    Cardiac Enzymes  Recent Labs Lab 03/16/16 1419  TROPONINI <0.03    Glucose  Recent Labs Lab 03/13/16 1637 03/13/16 2009 03/13/16 2316 03/14/16 0421 03/14/16 0736 03/14/16 1130  GLUCAP 111* 182* 111* 116* 109* 184*    Imaging Dg Chest Port 1 View  03/16/2016  CLINICAL DATA:  Status post intubation and NG tube placement. Shortness of breath. EXAM: PORTABLE CHEST 1 VIEW COMPARISON:  Single-view of the chest earlier today. FINDINGS: NG tube is seen with the side port at the gastroesophageal junction and tip just within the stomach. The tube should be advanced approximately 5 cm for better positioning. New endotracheal tube is in place with the  tip in good position at the level of the clavicular heads. Bilateral effusions and airspace disease persist without marked change since the study earlier today. IMPRESSION: ET tube in good position. NG tube should be advanced approximately 5 cm for better positioning. Bilateral pleural effusions and basilar airspace disease. Electronically Signed   By: Inge Rise M.D.   On: 03/16/2016 15:52   Dg Chest Port 1 View  03/16/2016  CLINICAL DATA:  Difficulty breathing today. EXAM: PORTABLE CHEST 1 VIEW COMPARISON:  Single-view of the chest 03/10/2016 and 03/04/2016. CT chest 03/07/2016. FINDINGS: Moderate bilateral pleural effusions have increased in size since the most recent examination. Associated basilar atelectasis is noted. There is cardiomegaly and mild interstitial edema. IMPRESSION: Increased moderate bilateral pleural effusions and basilar atelectasis. Cardiomegaly and mild interstitial edema. Electronically Signed   By: Inge Rise M.D.   On: 03/16/2016 14:38     STUDIES:  Echo 11/26/15>>EF 55% to 60%  CULTURES: Urine 7/20>> Blood x2 7/20>> Sputum>>  ANTIBIOTICS: None  SIGNIFICANT EVENTS: 7/20-Pt intubated due to acute on chronic hypoxic hypercapnic respiratory failure  LINES/TUBES: Left IJ CVL 7/20>> Right NG tube 7/20>>  DISCUSSION: This is an 82 yo female who presented to [facility] ER on 7/20 with c/o worsening shortness of breath initially attempted bipap in the ER however pt continued to decompensate requiring intubation. PCCM to admit to ICU due to acute on chronic hypoxic respiratory failure secondary to pulmonary edema  ASSESSMENT / PLAN:  PULMONARY A: Acute on chronic hypoxic hypercapnic respiratory failure secondary to pulmonary edema Hx: Asthma, lung cancer s/p radiation 2015 P:   Full vent support-wean as tolerated Vent bundle CXR in am 7/21 Intermittent ABG's Atrovent prn   CARDIOVASCULAR A:  Diastolic CHF Paroxysmal atrial fibrillation Hypotension  likely secondary to sedating medication Hx: CAD and NSTEMI (2016), Hypertension P:  Telemetry monitoring Heparin drip per pharmacy dosing (pt on outpatient xarelto for afibb) Levophed drip as needed to maintain map >65 Hold outpatient metoprolol, cardizem, and lasix for now Continue outpatient amiodarone   RENAL A:   Acute on chronic renal failure Hx: CKD Stage III P:   Trend BMP's Replace electrolytes as indicated Monitor uop Avoid nephrotoxic medications  GASTROINTESTINAL A:   Hx: GERD and diverticulitis  P:   Initiate tube feedings Pepcid for SUP  HEMATOLOGIC A:   Anemia P:  Heparin drip for VTE prophylaxis Trend CBC's Monitor for s/s of bleeding Transfuse for hgb <7  INFECTIOUS A:   Leukocytosis Hx: Pneumonia P:  Check PCT's if elevated will start ABX for HCAP coverage Trend WBC's and monitor fever curve  ENDOCRINE A:   Type II Diabetes Mellitus Hx: Cushing's disease  P:   CBG's q4hrs SSI   NEUROLOGIC A:   Hx: Migraines, depression,  P:   RASS goal: 0 to -1 Continuous fentanyl drip to maintain RASS goal Prn fentanyl as needed for pain Hold outpatient zoloft for now   FAMILY  - Updates: Pts son updated about plan of care and questions answered discussed code status with pts son pt now DNR   - Inter-disciplinary family meet or Palliative Care meeting due by:  03/23/2016  Marda Stalker, [place] Pager [phone] (please enter 7 digits) [place] Pager [phone] (please enter 7 digits)

## 2016-03-16 NOTE — ED Notes (Signed)
Pt to ED via EMS for SOB, pt was recently d/c today from hospital for copd exac and uncontrolled A-Fib. Home Health called EMS for pt sob, pt on 3L chonic [place] at home with sats 91%. Pt was placed on Bi-Pap en route , sats 99%., BP 149/68, HR 96-142.Pt also had 1 albuterol treatment en route.  Pt breathing labored at this time. Pt  was apparently shocked in ICU when tried to convert. RT and MD at bedside

## 2016-03-16 NOTE — Procedures (Signed)
Central Venous Catheter Insertion Procedure Note ZAREYA TUCKETT [id number] 11/14/1932  Procedure: Insertion of Central Venous Catheter Indications: Assessment of intravascular volume, Drug and/or fluid administration and Frequent blood sampling  Procedure Details Consent: Risks of procedure as well as the alternatives and risks of each were explained to the (patient/caregiver).  Consent for procedure obtained. Time Out: Verified patient identification, verified procedure, site/side was marked, verified correct patient position, special equipment/implants available, medications/allergies/relevent history reviewed, required imaging and test results available.  Performed  Maximum sterile technique was used including antiseptics, cap, gloves, gown, hand hygiene, mask and sheet. Skin prep: Chlorhexidine; local anesthetic administered A antimicrobial bonded/coated triple lumen catheter was placed in the left internal jugular vein using the Seldinger technique.  Evaluation Blood flow good Complications: No apparent complications Patient did tolerate procedure well. Chest X-ray ordered to verify placement.  CXR: normal.  Procedure performed under direct supervision of Dr. Mortimer Fries. Ultrasound utilized for real time vessel cannulization.  Marda Stalker, [place] Pager [phone] (please enter 7 digits) PCCM Consult Pager [phone] (please enter 7 digits)  Awilda Bill 03/16/2016, 7:25 PM

## 2016-03-16 NOTE — ED Notes (Signed)
NP at bedside placing central cath

## 2016-03-16 NOTE — ED Provider Notes (Signed)
Time Seen: Approximately 1404*  I have reviewed the triage notes  Chief Complaint: Shortness of Breath   History of Present Illness: Regina Burke is a 82 y.o. female who arrives by EMS in respiratory distress. Patient currently has no IV access. She apparently was recently admitted to the hospital and discharged home on her normal supplemental oxygen. EMS states they found her at 3 L at 91% but breathing very hard and she was placed on BiPAP. Patient's not aware enough to give any significant history or review of systems and arrives somewhat somnolent and fighting to remove the BiPAP mask. Review of records shows patient's had a recent stay here in the hospital for congestive heart failure. She apparently has chronic atrial fibrillation.  Past Medical History  Diagnosis Date  . COPD (chronic obstructive pulmonary disease) ([place])   . Chronic diastolic CHF (congestive heart failure) ([place])     a. 10/2015 Echo: EF 55-65%, Gr1 DD, mild MR, mildly dil LA, nl RV fxn, nl PASP.  Marland Kitchen Hypertensive heart disease   . HLD (hyperlipidemia)   . Sleep apnea   . Cushing's disease ([place])   . Type II diabetes mellitus ([place])   . Anemia   . GERD (gastroesophageal reflux disease)   . History of hiatal hernia   . Migraine   . Arthritis   . Gout   . Depression   . Asthma   . Coronary artery disease     a. 11/2014 NSTEMI/PCI: LM nl, LAD 53m D1 30, LCX mild dzs, OM1 20p, OM2 250mOM3 90p (2.25x8 Promus Premier DES), RCA nl.   . HOH (hard of hearing)   . Chronic kidney disease (CKD), stage III (moderate)   . Cough     CHRONIC AT NIGHT  . Edema     FEET/LEGS  . History of pneumonia   . Lung cancer (HCHonaunau-Napoopoodx'd 2014    S/P radiation 2015  . Diverticulitis   . Multiple allergies   . PAF (paroxysmal atrial fibrillation) (HCC)     a. on amio, Toprol XL, Cardizem CD, and Eliquis 2.5 mg bid (age & SCr); b. CHADS2VASc ==> 7 (CHF, HTN, age x 2, DM, vascular disease and sex category)  . Hypertension      Patient Active Problem List   Diagnosis Date Noted  . Congestive heart failure (HCAtlantic Beach  . DNR (do not resuscitate) discussion   . Palliative care encounter   . Acute on chronic diastolic CHF (congestive heart failure), NYHA class 1 (HCKarnes City07/06/2016  . Persistent atrial fibrillation (HCLavalette  . Chronic diastolic CHF (congestive heart failure) (HCBandana06/21/2017  . HLD (hyperlipidemia) 02/16/2016  . Iron deficiency anemia due to chronic blood loss   . Essential hypertension   . Chronic obstructive pulmonary disease with acute exacerbation (HCBurleigh  . SOB (shortness of breath)   . Acute on chronic renal failure (HCChickasha05/24/2017  . Dyspnea 11/25/2015  . Diaphoresis   . Renal insufficiency   . Centrilobular emphysema (HCPlacitas  . Carcinoma of lung (HCStayton02/02/2016  . Chronic obstructive pulmonary disease (HCCowarts11/16/2016  . Status post thoracentesis   . Renal failure   . S/P thoracentesis   . H/O: lung cancer   . Pleural effusion   . Respiratory failure (HCCazadero  . Coronary artery disease involving native coronary artery of native heart without angina pectoris   . S/P coronary artery stent placement   . Pressure ulcer 06/08/2015  . Acute kidney injury (HCKings Park  06/07/2015  . Chest wall pain 06/04/2015  . Acute respiratory failure ([place]) 05/16/2015  . Dehydration 05/09/2015  . Diabetes ([place]) 05/09/2015  . Closed fracture nasal bone 05/09/2015  . Preoperative clearance 04/15/2015  . Cancer of upper lobe of right lung ([place]) 01/08/2015  . Allergic state 01/08/2015  . Absolute anemia 01/08/2015  . Chronic kidney disease 01/08/2015  . CAFL (chronic airflow limitation) ([place]) 01/08/2015  . Arthritis, degenerative 01/08/2015  . 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 Rapid palpitations   . Diabetes mellitus, type 2 ([place]) 09/11/2014  . Type 2 diabetes mellitus ([place]) 09/11/2014    Past Surgical History  Procedure Laterality Date  . Adrenalectomy Left 1980's    "[place]"  . Abdominal  hysterectomy    . Appendectomy    . Cholecystectomy    . Coronary angioplasty with stent placement  12/23/2014  . Tonsillectomy    . Fracture surgery    . Wrist fracture surgery Bilateral ~ 2000  . Breast cyst excision Left   . Tubal ligation    . Left heart catheterization with coronary angiogram N/A 12/23/2014    Procedure: LEFT HEART CATHETERIZATION WITH CORONARY ANGIOGRAM;  Surgeon: Burnell Blanks, MD;  Location: [facility] CATH LAB;  Service: Cardiovascular;  Laterality: N/A;  . Percutaneous coronary stent intervention (pci-s)  12/23/2014    Procedure: PERCUTANEOUS CORONARY STENT INTERVENTION (PCI-S);  Surgeon: Burnell Blanks, MD;  Location: [facility] CATH LAB;  Service: Cardiovascular;;  Promus 2.25x8  . Cataract extraction w/phaco Right 12/28/2015    Procedure: CATARACT EXTRACTION PHACO AND INTRAOCULAR LENS PLACEMENT (IOC);  Surgeon: Birder Robson, MD;  Location: [facility] ORS;  Service: Ophthalmology;  Laterality: Right;  [place] 48.4 AP% 19.0 CDE 9.17 Fluid Pack Lot # [id number]  . Electrophysiologic study N/A 03/08/2016    Procedure: CARDIOVERSION;  Surgeon: Wende Bushy, MD;  Location: [facility] ORS;  Service: Cardiovascular;  Laterality: N/A;  . Electrophysiologic study N/A 03/07/2016    Procedure: Cardioversion;  Surgeon: Wende Bushy, MD;  Location: [facility] ORS;  Service: Cardiovascular;  Laterality: N/A;    Past Surgical History  Procedure Laterality Date  . Adrenalectomy Left 1980's    "Cushings"  . Abdominal hysterectomy    . Appendectomy    . Cholecystectomy    . Coronary angioplasty with stent placement  12/23/2014  . Tonsillectomy    . Fracture surgery    . Wrist fracture surgery Bilateral ~ 2000  . Breast cyst excision Left   . Tubal ligation    . Left heart catheterization with coronary angiogram N/A 12/23/2014    Procedure: LEFT HEART CATHETERIZATION WITH CORONARY ANGIOGRAM;  Surgeon: Burnell Blanks, MD;  Location: [facility] CATH LAB;  Service: Cardiovascular;  Laterality: N/A;   . Percutaneous coronary stent intervention (pci-s)  12/23/2014    Procedure: PERCUTANEOUS CORONARY STENT INTERVENTION (PCI-S);  Surgeon: Burnell Blanks, MD;  Location: [facility] CATH LAB;  Service: Cardiovascular;;  Promus 2.25x8  . Cataract extraction w/phaco Right 12/28/2015    Procedure: CATARACT EXTRACTION PHACO AND INTRAOCULAR LENS PLACEMENT (IOC);  Surgeon: Birder Robson, MD;  Location: [facility] ORS;  Service: Ophthalmology;  Laterality: Right;  [place] 48.4 AP% 19.0 CDE 9.17 Fluid Pack Lot # [id number]  . Electrophysiologic study N/A 03/08/2016    Procedure: CARDIOVERSION;  Surgeon: Wende Bushy, MD;  Location: [facility] ORS;  Service: Cardiovascular;  Laterality: N/A;  . Electrophysiologic study N/A 03/07/2016    Procedure: Cardioversion;  Surgeon: Wende Bushy, MD;  Location: [facility] ORS;  Service: Cardiovascular;  Laterality: N/A;    Current Outpatient  Rx  Name  Route  Sig  Dispense  Refill  . ALPRAZolam (XANAX) 0.25 MG tablet   Oral   Take 0.25 mg by mouth 2 (two) times daily as needed for anxiety.         Marland Kitchen amiodarone (PACERONE) 400 MG tablet   Oral   Take 1 tablet (400 mg total) by mouth 2 (two) times daily.   60 tablet   3   . atorvastatin (LIPITOR) 20 MG tablet   Oral   Take 1 tablet (20 mg total) by mouth every evening.   90 tablet   3   . cholecalciferol (VITAMIN D) 400 UNITS TABS tablet   Oral   Take 800 Units by mouth every evening.          . cyanocobalamin 500 MCG tablet   Oral   Take 500 mcg by mouth every evening.         . diltiazem (CARDIZEM CD) 240 MG 24 hr capsule   Oral   Take 1 capsule (240 mg total) by mouth every evening.   30 capsule   1   . febuxostat (ULORIC) 40 MG tablet   Oral   Take 40 mg by mouth every evening.          . fenofibrate (TRICOR) 48 MG tablet   Oral   Take 48 mg by mouth every evening.          . ferrous sulfate 325 (65 FE) MG EC tablet   Oral   Take 325 mg by mouth every evening.          . fluticasone  furoate-vilanterol (BREO ELLIPTA) 100-25 MCG/INH AEPB   Inhalation   Inhale 1 puff into the lungs daily.         . furosemide (LASIX) 20 MG tablet   Oral   Take 20 mg by mouth daily.    60 tablet   3   . metoprolol succinate (TOPROL XL) 25 MG 24 hr tablet   Oral   Take 50 mg by mouth 2 (two) times daily.    60 tablet   6   . nitroGLYCERIN (NITROSTAT) 0.4 MG SL tablet   Sublingual   Place 1 tablet (0.4 mg total) under the tongue every 5 (five) minutes as needed for chest pain.   25 tablet   3   . pantoprazole (PROTONIX) 40 MG tablet   Oral   Take 40 mg by mouth every evening.          . Rivaroxaban (XARELTO) 15 MG TABS tablet   Oral   Take 1 tablet (15 mg total) by mouth daily with supper.   90 tablet   1   . sertraline (ZOLOFT) 50 MG tablet   Oral   Take 50 mg by mouth every evening.          . tiotropium (SPIRIVA) 18 MCG inhalation capsule   Inhalation   Place 18 mcg into inhaler and inhale daily.           Allergies:  Ciprofloxacin; Doxycycline; Penicillins; Sulfa antibiotics; Morphine and related; Albuterol sulfate; and Cefuroxime  Family History: Family History  Problem Relation Age of Onset  . Heart disease Mother   . Diabetes Mother   . Osteoarthritis Mother   . Hypertension Mother   . Heart disease Father   . Hypertension Father   . COPD Brother     Social History: Social History  Substance Use Topics  . Smoking status: Former  Smoker -- 1.00 packs/day for 45 years    Types: Cigarettes    Quit date: 04/25/1998  . Smokeless tobacco: Never Used  . Alcohol Use: No     Comment: 12/23/2014 "might have a couple mixed drinks/year"     Review of Systems:   10 point review of systems was performed and was otherwise negative: Review of systems difficult to obtain other than review of the medical record and EMS notes. Constitutional: No fever Eyes: No visual disturbances ENT: No sore throat, ear pain Cardiac: No chest pain Respiratory:  Respiratory distress. According to EMS "" wasn't moving any air "". Abdomen: No abdominal pain, no vomiting, No diarrhea Endocrine: No weight loss, No night sweats Extremities: No peripheral edema, cyanosis Skin: No rashes, easy bruising Neurologic: No focal weakness, patient arrives somnolent Urologic: No dysuria, Hematuria, or urinary frequency   Physical Exam:  ED Triage Vitals  Enc Vitals Group     BP 03/16/16 1403 150/131 mmHg     Pulse Rate 03/16/16 1403 105     Resp 03/16/16 1404 27     Temp 03/16/16 1405 97.9 F (36.6 C)     Temp Source 03/16/16 1405 Axillary     SpO2 03/16/16 1403 98 %     Weight --      Height --      Head Cir --      Peak Flow --      Pain Score --      Pain Loc --      Pain Edu? --      Excl. in [place]? --     General: Awake , Alert , and Oriented times 1. Patient's in respiratory distress and has a hard time speaking in any form. She has some upper respiratory retractions. Head: Normal cephalic , atraumatic Eyes: Pupils equal , round, reactive to light Nose/Throat: No nasal drainage, patent upper airway without erythema or exudate.  Neck: Supple, Full range of motion, No anterior adenopathy or palpable thyroid masses Lungs: Air movement through the BiPAP shows clearing of the apices. Rales heard throughout at the bases. Heart: Irregular rate, irregular rhythm without murmurs , no obvious gallops or rubs  Abdomen: Soft, non tender without rebound, guarding , or rigidity; bowel sounds positive and symmetric in all 4 quadrants. No organomegaly .        Extremities: 2 plus symmetric pulses. No edema, clubbing or cyanosis Neurologic: normal ambulation, Motor symmetric without deficits, sensory intact Skin: warm, dry, no rashes   Labs:   All laboratory work was reviewed including any pertinent negatives or positives listed below:  Labs Reviewed  BLOOD GAS, ARTERIAL - Abnormal; Notable for the following:    pH, Arterial 7.27 (*)    pCO2 arterial 78  (*)    pO2, Arterial 47 (*)    All other components within normal limits  PROTIME-INR - Abnormal; Notable for the following:    Prothrombin Time 20.5 (*)    All other components within normal limits  CBC WITH DIFFERENTIAL/PLATELET - Abnormal; Notable for the following:    WBC 13.8 (*)    Hemoglobin 10.8 (*)    HCT 33.8 (*)    RDW 16.9 (*)    Neutro Abs 11.5 (*)    All other components within normal limits  BRAIN NATRIURETIC PEPTIDE - Abnormal; Notable for the following:    B Natriuretic Peptide 1566.0 (*)    All other components within normal limits  FIBRIN DERIVATIVES D-DIMER ([facility] ONLY) - Abnormal;  Notable for the following:    Fibrin derivatives D-dimer ([facility]) 1155 (*)    All other components within normal limits  TROPONIN I  MAGNESIUM  COMPREHENSIVE METABOLIC PANEL  ABO/RH  Patient has a significant the elevated BNP and also D-dimer test. Blood gas shows pH of 7.27 and PCO2 of 78  EKG:  ED ECG REPORT I, Daymon Larsen, the attending physician, personally viewed and interpreted this ECG.  Date: 03/16/2016 EKG Time: 1426 Rate: *95 Rhythm: normal sinus rhythm QRS Axis: normal Intervals: Left bundle branch block ST/T Wave abnormalities: normal Conduction Disturbances: none Narrative Interpretation: unremarkable No significant change from previous EKGs   Radiology:  Final result by Rad Results In Interface (03/16/16 14:38:49)   Narrative:   CLINICAL DATA: Difficulty breathing today.  EXAM: PORTABLE CHEST 1 VIEW  COMPARISON: Single-view of the chest 03/10/2016 and 03/04/2016. CT chest 03/07/2016.  FINDINGS: Moderate bilateral pleural effusions have increased in size since the most recent examination. Associated basilar atelectasis is noted. There is cardiomegaly and mild interstitial edema.  IMPRESSION: Increased moderate bilateral pleural effusions and basilar atelectasis.  Cardiomegaly and mild interstitial edema.   Electronically Signed By:  Inge Rise M.D. On: 03/16/2016 14:38          Postintubation film is still pending    I personally reviewed the radiologic studies   Procedures:  #1 patient had an ultrasound of left cubital vein for IV placement. I was able to ultrasound had a vein in the left antecubital fossa. IV was established on first attempt. Patient tolerated procedure well. IV flushes easily. #2 Rapid sequence intubation. After discussing options at the bedside with the son and we agreed that the patient require intubation due to somnolence and CO2 retention. The patient was bag-valve-mask ventilation after being taken off of the BiPAP. Patient had removal of her dentures with both upper and lower plates. Patient was given etomidate 20 mg IV and '100mg'$  of succinylcholine IV WITH GOOD RELAXATION. PULSE OXIMETRY REMAINED STABLE. PATIENT WAS INTUBATED WITH GOOD VISUALIZATION OF THE VOCAL CORDS ON A SINGLE PASS WITH A 7.0 ENDOTRACHEAL TUBE. No complications Patient was placed on a ventilator with standard settings and repeat chest x-ray is pending. Endotracheal tube was taped at approximately 22 cm symmetric breath sounds. #3 Nasogastric tube placement: I inserted the NG tube with good placement per auscultation. Patient was placed on intermittent pressure. NG tube was taped in place  Critical Care:  CRITICAL CARE Performed by: Daymon Larsen   Total critical care time: 53 minutes  Critical care time was exclusive of separately billable procedures and treating other patients.  Critical care was necessary to treat or prevent imminent or life-threatening deterioration.  Critical care was time spent personally by me on the following activities: development of treatment plan with patient and/or surrogate as well as nursing, discussions with consultants, evaluation of patient's response to treatment, examination of patient, obtaining history from patient or surrogate, ordering and performing treatments  and interventions, ordering and review of laboratory studies, ordering and review of radiographic studies, pulse oximetry and re-evaluation of patient's condition. Initial evaluation, treatment, and intervention for respiratory distress   ED Course:  Patient arrives on BiPAP and was fighting the BiPAP and I gave her some IM Ativan while we tried to establish IV access, etc. We are able to establish a left antecubital fossa proximal IV which flushed well. Patient's difficult IV stick. Patient had a arterial blood gas performed and on the short-term was on BiPAP. She had  a portable chest x-ray which shows indications of pulmonary edema, etc. She was given IV Lasix. The patient became more and more somnolent and blood gas shows an elevated PCO2 and low pH. I felt that given the patient's somnolence and respiratory distress she will require intubation. The patient was successfully rapid sequence intubated and currently is stable on current vent settings. Patient require fluid resuscitation and phone contact was made with critical care team.    Assessment:  Acute respiratory distress Rapid sequence intubation Pulmonary edema     Plan: * Admission intensive care            Daymon Larsen, MD 03/16/16 1606

## 2016-03-16 NOTE — ED Notes (Signed)
50 mcg fentanyl given at this time per NP

## 2016-03-16 NOTE — ED Notes (Signed)
50 mcg fentanyl given at this time per MD

## 2016-03-16 NOTE — ED Notes (Addendum)
Pt given '1mg'$  ativan IM LFT deltoid at this time per MD Marcelene Butte

## 2016-03-16 NOTE — Consult Note (Signed)
ANTICOAGULATION CONSULT NOTE - Initial Consult  Pharmacy Consult for heparin drip Indication: atrial fibrillation  Allergies  Allergen Reactions  . Ciprofloxacin Shortness Of Breath, Itching and Rash  . Doxycycline Shortness Of Breath, Itching and Rash  . Penicillins Shortness Of Breath, Itching, Rash and Other (See Comments)    Has patient had a PCN reaction causing immediate rash, facial/tongue/throat swelling, SOB or lightheadedness with hypotension: Yes Has patient had a PCN reaction causing severe rash involving mucus membranes or skin necrosis: No Has patient had a PCN reaction that required hospitalization No Has patient had a PCN reaction occurring within the last 10 years: No If all of the above answers are "NO", then may proceed with Cephalosporin use.  . Sulfa Antibiotics Shortness Of Breath, Itching and Rash  . Morphine And Related Itching  . Albuterol Sulfate   . Cefuroxime Rash    blisters    Patient Measurements: Height: '5\' 5"'$  (165.1 cm) Weight: 188 lb 4.4 oz (85.4 kg) IBW/kg (Calculated) : 57 Heparin Dosing Weight: 75.5kg  Vital Signs: Temp: 99.5 F (37.5 C) (07/20 1743) Temp Source: Oral (07/20 1743) BP: 92/62 mmHg (07/20 1743) Pulse Rate: 103 (07/20 1743)  Labs:  Recent Labs  03/14/16 0451 03/16/16 1419 03/16/16 1535  HGB 9.9* 10.8*  --   HCT 30.2* 33.8*  --   PLT 172 320  --   LABPROT  --  20.5*  --   INR  --  1.76  --   CREATININE 1.48*  --  1.89*  TROPONINI  --  <0.03  --     Estimated Creatinine Clearance: 25.6 mL/min (by C-G formula based on Cr of 1.89).   Medical History: Past Medical History  Diagnosis Date  . COPD (chronic obstructive pulmonary disease) ([place])   . Chronic diastolic CHF (congestive heart failure) ([place])     a. 10/2015 Echo: EF 55-65%, Gr1 DD, mild MR, mildly dil LA, nl RV fxn, nl PASP.  Marland Kitchen Hypertensive heart disease   . HLD (hyperlipidemia)   . Sleep apnea   . Cushing's disease ([place])   . Type II diabetes mellitus  ([place])   . Anemia   . GERD (gastroesophageal reflux disease)   . History of hiatal hernia   . Migraine   . Arthritis   . Gout   . Depression   . Asthma   . Coronary artery disease     a. 11/2014 NSTEMI/PCI: LM nl, LAD 33m D1 30, LCX mild dzs, OM1 20p, OM2 292mOM3 90p (2.25x8 Promus Premier DES), RCA nl.   . HOH (hard of hearing)   . Chronic kidney disease (CKD), stage III (moderate)   . Cough     CHRONIC AT NIGHT  . Edema     FEET/LEGS  . History of pneumonia   . Lung cancer ([place] Heightsdx'd 2014    S/P radiation 2015  . Diverticulitis   . Multiple allergies   . PAF (paroxysmal atrial fibrillation) (HCC)     a. on amio, Toprol XL, Cardizem CD, and Eliquis 2.5 mg bid (age & SCr); b. CHADS2VASc ==> 7 (CHF, HTN, age x 2, DM, vascular disease and sex category)  . Hypertension     Medications:  Scheduled:  . amiodarone  400 mg Per Tube BID  . antiseptic oral rinse  7 mL Mouth Rinse 10 times per day  . chlorhexidine gluconate (SAGE KIT)  15 mL Mouth Rinse BID  . famotidine (PEPCID) IV  20 mg Intravenous Q12H  . feeding supplement (  PRO-STAT SUGAR FREE 64)  30 mL Per Tube BID  . feeding supplement (VITAL HIGH PROTEIN)  1,000 mL Per Tube Q24H  . nitroGLYCERIN  1 inch Topical Once    Assessment: Pt is a 82 year old female who presents in respiratory distress. Pt has a PMH of afib on Xarelto. Last dose was 1100 this AM 7/20 per son. Pt is currently intubated. Pharmacy consulted to dose a heparin drip.  Goal of Therapy:  Heparin level 0.3-0.7 units/ml aPTT 66-102 seconds Monitor platelets by anticoagulation protocol: Yes    Plan:  Heparin drip to start TOMORROW 7/21 @ 1100 (24 hr after last xarelto dose) Will draw baseline APTT and HL tomorrow morning. If HL is elevated above 0.1 and does not correlate with APPT will need to dose based off of APTT until HL is within range and correlates with APTT.  Will not bolus. Start heparin infusion at 1100 units/hr Check anti-Xa level in 8 hours  and daily while on heparin Continue to monitor H&H and platelets  Pranav Lince D Ninette Cotta, Pharm.D Clinical Pharmacist   03/16/2016,6:04 PM

## 2016-03-17 ENCOUNTER — Inpatient Hospital Stay: Payer: Medicare Other

## 2016-03-17 ENCOUNTER — Ambulatory Visit: Payer: Medicare Other | Admitting: Cardiovascular Disease

## 2016-03-17 DIAGNOSIS — J9601 Acute respiratory failure with hypoxia: Secondary | ICD-10-CM

## 2016-03-17 LAB — BASIC METABOLIC PANEL
ANION GAP: 7 (ref 5–15)
BUN: 60 mg/dL — ABNORMAL HIGH (ref 6–20)
CALCIUM: 8.2 mg/dL — AB (ref 8.9–10.3)
CO2: 31 mmol/L (ref 22–32)
Chloride: 104 mmol/L (ref 101–111)
Creatinine, Ser: 2.18 mg/dL — ABNORMAL HIGH (ref 0.44–1.00)
GFR, EST AFRICAN AMERICAN: 23 mL/min — AB (ref >60)
GFR, EST NON AFRICAN AMERICAN: 20 mL/min — AB (ref >60)
GLUCOSE: 152 mg/dL — AB (ref 65–99)
POTASSIUM: 4.2 mmol/L (ref 3.5–5.1)
SODIUM: 142 mmol/L (ref 135–145)

## 2016-03-17 LAB — HEPARIN LEVEL (UNFRACTIONATED)
Heparin Unfractionated: 0.4 [IU]/mL (ref 0.30–0.70)
Heparin Unfractionated: 0.35 [IU]/mL (ref 0.30–0.70)

## 2016-03-17 LAB — GLUCOSE, CAPILLARY
GLUCOSE-CAPILLARY: 174 mg/dL — AB (ref 65–99)
Glucose-Capillary: 107 mg/dL — ABNORMAL HIGH (ref 65–99)
Glucose-Capillary: 122 mg/dL — ABNORMAL HIGH (ref 65–99)
Glucose-Capillary: 125 mg/dL — ABNORMAL HIGH (ref 65–99)
Glucose-Capillary: 160 mg/dL — ABNORMAL HIGH (ref 65–99)
Glucose-Capillary: 166 mg/dL — ABNORMAL HIGH (ref 65–99)

## 2016-03-17 LAB — CBC
HCT: 28.4 % — ABNORMAL LOW (ref 35.0–47.0)
Hemoglobin: 9.5 g/dL — ABNORMAL LOW (ref 12.0–16.0)
MCH: 26.8 pg (ref 26.0–34.0)
MCHC: 33.6 g/dL (ref 32.0–36.0)
MCV: 79.9 fL — AB (ref 80.0–100.0)
PLATELETS: 207 10*3/uL (ref 150–440)
RBC: 3.55 MIL/uL — AB (ref 3.80–5.20)
RDW: 17.1 % — ABNORMAL HIGH (ref 11.5–14.5)
WBC: 10 10*3/uL (ref 3.6–11.0)

## 2016-03-17 LAB — BLOOD GAS, ARTERIAL
Acid-Base Excess: 7.8 mmol/L — ABNORMAL HIGH (ref 0.0–3.0)
Allens test (pass/fail): POSITIVE — AB
Bicarbonate: 34.2 meq/L — ABNORMAL HIGH (ref 21.0–28.0)
FIO2: 0.4
MECHVT: 450 mL
Mechanical Rate: 18
O2 Saturation: 97.6 %
PEEP: 5 cmH2O
Patient temperature: 37
pCO2 arterial: 54 mmHg — ABNORMAL HIGH (ref 32.0–48.0)
pH, Arterial: 7.41 (ref 7.350–7.450)
pO2, Arterial: 97 mmHg (ref 83.0–108.0)

## 2016-03-17 LAB — MAGNESIUM: Magnesium: 2.1 mg/dL (ref 1.7–2.4)

## 2016-03-17 LAB — PHOSPHORUS: Phosphorus: 3 mg/dL (ref 2.5–4.6)

## 2016-03-17 LAB — APTT
aPTT: 33 s (ref 24–36)
aPTT: 33 s (ref 24–36)
aPTT: 55 seconds — ABNORMAL HIGH (ref 24–36)

## 2016-03-17 LAB — PROCALCITONIN: Procalcitonin: 0.22 ng/mL

## 2016-03-17 LAB — BRAIN NATRIURETIC PEPTIDE: B Natriuretic Peptide: 548 pg/mL — ABNORMAL HIGH (ref 0.0–100.0)

## 2016-03-17 MED ORDER — LORAZEPAM 2 MG/ML IJ SOLN: 0.5000 mg | INTRAMUSCULAR | Status: DC | PRN

## 2016-03-17 MED ORDER — FENTANYL CITRATE (PF) 100 MCG/2ML IJ SOLN: 12.5000 ug | INTRAMUSCULAR | Status: DC | PRN

## 2016-03-17 MED ORDER — IPRATROPIUM BROMIDE 0.02 % IN SOLN
RESPIRATORY_TRACT | Status: AC
  Filled 2016-03-17: qty 2.5

## 2016-03-17 MED ORDER — FENTANYL CITRATE (PF) 100 MCG/2ML IJ SOLN: 25.0000 ug | INTRAMUSCULAR | Status: DC | PRN

## 2016-03-17 MED ORDER — FAMOTIDINE IN NACL 20-0.9 MG/50ML-% IV SOLN
20.0000 mg | Freq: Every day | INTRAVENOUS | Status: DC
  Administered 2016-03-17: 20 mg via INTRAVENOUS
  Filled 2016-03-17 (×2): qty 50

## 2016-03-17 MED ORDER — IPRATROPIUM BROMIDE 0.02 % IN SOLN
0.5000 mg | RESPIRATORY_TRACT | Status: DC | PRN
  Administered 2016-03-17 – 2016-03-22 (×3): 0.5 mg via RESPIRATORY_TRACT
  Filled 2016-03-17 (×3): qty 2.5

## 2016-03-17 MED ORDER — AMIODARONE LOAD VIA INFUSION
150.0000 mg | Freq: Once | INTRAVENOUS | Status: AC
  Administered 2016-03-17: 150 mg via INTRAVENOUS
  Filled 2016-03-17: qty 83.34

## 2016-03-17 MED ORDER — AMIODARONE HCL IN DEXTROSE 360-4.14 MG/200ML-% IV SOLN
30.0000 mg/h | INTRAVENOUS | Status: DC
  Administered 2016-03-17 – 2016-03-18 (×2): 30 mg/h via INTRAVENOUS
  Filled 2016-03-17 (×5): qty 200

## 2016-03-17 MED ORDER — INSULIN ASPART 100 UNIT/ML ~~LOC~~ SOLN
0.0000 [IU] | SUBCUTANEOUS | Status: DC
  Administered 2016-03-17: 2 [IU] via SUBCUTANEOUS
  Administered 2016-03-18: 1 [IU] via SUBCUTANEOUS
  Filled 2016-03-17: qty 1
  Filled 2016-03-17: qty 2

## 2016-03-17 MED ORDER — IPRATROPIUM BROMIDE 0.02 % IN SOLN: 0.5000 mg | RESPIRATORY_TRACT | Status: DC | PRN

## 2016-03-17 MED ORDER — INSULIN ASPART 100 UNIT/ML ~~LOC~~ SOLN: 0.0000 [IU] | SUBCUTANEOUS | Status: DC

## 2016-03-17 MED ORDER — FLUTICASONE FUROATE-VILANTEROL 100-25 MCG/INH IN AEPB
1.0000 | INHALATION_SPRAY | Freq: Every day | RESPIRATORY_TRACT | Status: DC
  Administered 2016-03-17 – 2016-03-29 (×12): 1 via RESPIRATORY_TRACT
  Filled 2016-03-17 (×2): qty 28

## 2016-03-17 MED ORDER — TIOTROPIUM BROMIDE MONOHYDRATE 18 MCG IN CAPS
18.0000 ug | ORAL_CAPSULE | Freq: Every day | RESPIRATORY_TRACT | Status: DC
  Administered 2016-03-17 – 2016-03-29 (×13): 18 ug via RESPIRATORY_TRACT
  Filled 2016-03-17 (×3): qty 5

## 2016-03-17 MED ORDER — AMIODARONE HCL IN DEXTROSE 360-4.14 MG/200ML-% IV SOLN
60.0000 mg/h | INTRAVENOUS | Status: AC
  Administered 2016-03-17: 60 mg/h via INTRAVENOUS
  Filled 2016-03-17 (×3): qty 200

## 2016-03-17 NOTE — Progress Notes (Signed)
Inpatient Diabetes Program Recommendations  AACE/ADA: New Consensus Statement on Inpatient Glycemic Control (2015)  Target Ranges:  Prepandial:   less than 140 mg/dL      Peak postprandial:   less than 180 mg/dL (1-2 hours)      Critically ill patients:  140 - 180 mg/dL   Lab Results  Component Value Date   GLUCAP 166* 03/17/2016   HGBA1C 6.6* 12/23/2014    Review of Glycemic Control  Results for Regina Burke, Regina Burke (MRN [id number]) as of 03/17/2016 09:34  Ref. Range 03/16/2016 17:40 03/16/2016 20:21 03/16/2016 23:57 03/17/2016 04:18 03/17/2016 07:22  Glucose-Capillary Latest Ref Range: 65-99 mg/dL 142 (H) 132 (H) 166 (H) 160 (H) 166 (H)    Diabetes history: Type 2 diabetes Outpatient Diabetes medications: none noted Current orders for Inpatient glycemic control: Novolog 0-15 units q4h decreased to Novolog sensitive correction  Inpatient Diabetes Program Recommendations: Agree with recent change for Novolog moderate correction insulin (0-15units q4h)  down to Novolog sensitive correction scale.  Gentry Fitz, RN, BA, MHA, CDE Diabetes Coordinator Inpatient Diabetes Program  [phone] (Team Pager) [phone] ([place]) 03/17/2016 9:43 AM

## 2016-03-17 NOTE — Progress Notes (Signed)
PULMONARY / CRITICAL CARE MEDICINE   Name: Regina Burke MRN: [id number] DOB: [birth year]    ADMISSION DATE:  03/16/2016   PT PROFILE: Recurrent acute on chronic hypoxic and hypercarbic respiratory failure due to CHF in setting of PAF with RVR. CXR consistent with CHF  MAJOR EVENTS/TEST RESULTS: 07/08 - 07/18: hospitalized for acute on chronic respiratory failure, pulmonary edema, PAF 07/20 presented to ED with respiratory distress and pulmonary edema. Intubated in ED and admitted to ICU 07/21 AFRVR > amiodarone infusion initiated. Passed SBT and extubated 07/21 echocardiogram:   INDWELLING DEVICES:: ETT 07/20 >> 07/21  MICRO DATA: Blood 07/20 >>    ANTIMICROBIALS:    SUBJECTIVE:  No acute issues. Remains on levophed at 82mg with MAPs>65. Nods in denial to pain  VITAL SIGNS: BP 88/54 mmHg  Pulse 87  Temp(Src) 97.6 F (36.4 C) (Axillary)  Resp 18  Ht '5\' 5"'$  (1.651 m)  Wt 191 lb 2.2 oz (86.7 kg)  BMI 31.81 kg/m2  SpO2 98%  HEMODYNAMICS:    VENTILATOR SETTINGS: Vent Mode:  [-] PRVC FiO2 (%):  [40 %] 40 % Set Rate:  [18 bmp] 18 bmp Vt Set:  [450 mL] 450 mL PEEP:  [5 cmH20] 5 cmH20  INTAKE / OUTPUT: I/O last 3 completed shifts: In: 63.8 [I.V.:23.8; NG/GT:40] Out: -   PHYSICAL EXAMINATION: General:  Chronically ill-looking female, intubated Neuro: Awakens to voice, follows commands, +gag HEENT:  PERRLA, neck is supple, no JVD Cardiovascular:  Irregular, irregular, no M/R/G Lungs:  Bilateral airflow with course rhonchi in all lung fields, normal WOB Abdomen:  +BS x4, obese, soft, non tender, mildly distended Musculoskeletal:  Normal bulk and tone Extremities: +2 pulses, no cyanosis Skin:  Intact   LABS:  BMET  Recent Labs Lab 03/12/16 0427 03/14/16 0451 03/16/16 1535  NA 140 141 142  K 3.9 4.3 4.8  CL 98* 102 102  CO2 34* 32 32  BUN 71* 53* 48*  CREATININE 1.67* 1.48* 1.89*  GLUCOSE 122* 117* 182*    Electrolytes  Recent Labs Lab  03/12/16 0427 03/14/16 0451 03/16/16 1419 03/16/16 1535 03/16/16 1840 03/17/16 0500  CALCIUM 8.5* 8.5*  --  8.5*  --   --   MG  --   --  2.4  --  2.3 2.1  PHOS  --   --   --   --  3.6 3.0    CBC  Recent Labs Lab 03/12/16 0427 03/14/16 0451 03/16/16 1419  WBC 6.5 6.1 13.8*  HGB 9.5* 9.9* 10.8*  HCT 28.8* 30.2* 33.8*  PLT 179 172 320    Coag's  Recent Labs Lab 03/16/16 1419 03/17/16 0520  APTT  --  33  INR 1.76  --     Sepsis Markers  Recent Labs Lab 03/16/16 1840  PROCALCITON 0.13    ABG  Recent Labs Lab 03/10/16 1100 03/16/16 1404 03/17/16 0456  PHART 7.43 7.27* 7.41  PCO2ART 52* 78* 54*  PO2ART 89 47* 97    Liver Enzymes  Recent Labs Lab 03/16/16 1535  AST 57*  ALT 102*  ALKPHOS 56  BILITOT 0.8  ALBUMIN 3.7    Cardiac Enzymes  Recent Labs Lab 03/16/16 1419  TROPONINI <0.03    Glucose  Recent Labs Lab 03/14/16 0736 03/14/16 1130 03/16/16 1740 03/16/16 2021 03/16/16 2357 03/17/16 0418  GLUCAP 109* 184* 142* 132* 166* 160*    CXR: CM, mild edema, smal B effusions   ASSESSMENT / PLAN:  PULMONARY A: Acute on chronic hypoxic  hypercapnic respiratory failure Pulmonary edema Hx: Asthma, lung cancer s/p radiation 2015 P:   Extubate  Supp O2 Monitor in ICU post extubation Resume tiotropium  CARDIOVASCULAR A:  Diastolic CHF Paroxysmal atrial fibrillation Hypotension likely secondary to sedating medication Hx: CAD and NSTEMI (2016), Hypertension P:  Telemetry monitoring Begin amiodarone gtt 07/21 Cont heparin gtt Holding metoprolol and dilt Echocardiogram 07/21  RENAL A:   AKI CKD Stage III P:   Monitor BMET intermittently Monitor I/Os Correct electrolytes as indicated Avoid nephrotoxic medications  GASTROINTESTINAL A:   Hx: GERD and diverticulitis  H/o constipation P:   SUP: IV famotidine NPO post extubation X 4 hrs, then begin diet  HEMATOLOGIC A:   Anemia without acute blood loss P:  DVT  px: full dose heparin Monitor CBC intermittently Transfuse per usual guidelines  INFECTIOUS A:   No overt infection P:   Monitor temp, WBC count Micro and abx as above  ENDOCRINE A:   DM 2 P:   Cont SSI  NEUROLOGIC A:   No active issues P:   Minimize sedatives post extubation  FAMILY: Son updated @ bedside   Merton Border, MD PCCM service Mobile [phone] Pager [phone] 03/17/2016

## 2016-03-17 NOTE — Care Management (Signed)
Patient admitted with Acute on chronic hypoxic hypercapnic respiratory failure secondary to pulmonary edema. Patient with recent discharge from [facility].  Lives at home with son.  Has walker, cane, Chronic home O2, and CPAP at home.  Patient is currently open with RN, PT, aide, and resp care through [facility] services with [facility].    Patient did not qualify for home BIPAP previous discharge.  Patient has CPAP at home, however she refuses to use it.  Patient was offered the option to pay out of pocket at time of discharge for BIPAP and declined.  RNCM following for discharge needs

## 2016-03-17 NOTE — Consult Note (Addendum)
ANTICOAGULATION CONSULT NOTE - Initial Consult  Pharmacy Consult for heparin drip Indication: atrial fibrillation  Allergies  Allergen Reactions  . Ciprofloxacin Shortness Of Breath, Itching and Rash  . Doxycycline Shortness Of Breath, Itching and Rash  . Penicillins Shortness Of Breath, Itching, Rash and Other (See Comments)    Has patient had a PCN reaction causing immediate rash, facial/tongue/throat swelling, SOB or lightheadedness with hypotension: Yes Has patient had a PCN reaction causing severe rash involving mucus membranes or skin necrosis: No Has patient had a PCN reaction that required hospitalization No Has patient had a PCN reaction occurring within the last 10 years: No If all of the above answers are "NO", then may proceed with Cephalosporin use.  . Sulfa Antibiotics Shortness Of Breath, Itching and Rash  . Morphine And Related Itching  . Albuterol Sulfate   . Cefuroxime Rash    blisters    Patient Measurements: Height: '5\' 5"'$  (165.1 cm) Weight: 191 lb 2.2 oz (86.7 kg) IBW/kg (Calculated) : 57 Heparin Dosing Weight: 75.5kg  Vital Signs: BP: 103/58 mmHg (07/21 1300) Pulse Rate: 124 (07/21 1920)  Labs:  Recent Labs  03/16/16 1419 03/16/16 1535 03/17/16 0520 03/17/16 0630 03/17/16 1844  HGB 10.8*  --   --  9.5*  --   HCT 33.8*  --   --  28.4*  --   PLT 320  --   --  207  --   APTT  --   --  33 33 55*  LABPROT 20.5*  --   --   --   --   INR 1.76  --   --   --   --   HEPARINUNFRC  --   --  0.40 0.35  --   CREATININE  --  1.89*  --  2.18*  --   TROPONINI <0.03  --   --   --   --     Estimated Creatinine Clearance: 22.4 mL/min (by C-G formula based on Cr of 2.18).   Medical History: Past Medical History  Diagnosis Date  . COPD (chronic obstructive pulmonary disease) ([place])   . Chronic diastolic CHF (congestive heart failure) ([place])     a. 10/2015 Echo: EF 55-65%, Gr1 DD, mild MR, mildly dil LA, nl RV fxn, nl PASP.  Marland Kitchen Hypertensive heart disease   .  HLD (hyperlipidemia)   . Sleep apnea   . Cushing's disease ([place])   . Type II diabetes mellitus ([place])   . Anemia   . GERD (gastroesophageal reflux disease)   . History of hiatal hernia   . Migraine   . Arthritis   . Gout   . Depression   . Asthma   . Coronary artery disease     a. 11/2014 NSTEMI/PCI: LM nl, LAD 64m D1 30, LCX mild dzs, OM1 20p, OM2 233mOM3 90p (2.25x8 Promus Premier DES), RCA nl.   . HOH (hard of hearing)   . Chronic kidney disease (CKD), stage III (moderate)   . Cough     CHRONIC AT NIGHT  . Edema     FEET/LEGS  . History of pneumonia   . Lung cancer (HCHolcombdx'd 2014    S/P radiation 2015  . Diverticulitis   . Multiple allergies   . PAF (paroxysmal atrial fibrillation) (HCC)     a. on amio, Toprol XL, Cardizem CD, and Eliquis 2.5 mg bid (age & SCr); b. CHADS2VASc ==> 7 (CHF, HTN, age x 2, DM, vascular disease and sex category)  .  Hypertension     Medications:  Scheduled:  . antiseptic oral rinse  7 mL Mouth Rinse 10 times per day  . chlorhexidine gluconate (SAGE KIT)  15 mL Mouth Rinse BID  . famotidine (PEPCID) IV  20 mg Intravenous QHS  . fluticasone furoate-vilanterol  1 puff Inhalation Daily  . insulin aspart  0-9 Units Subcutaneous Q4H  . ipratropium      . tiotropium  18 mcg Inhalation Daily    Assessment: Pt is a 82 year old female who presents in respiratory distress. Pt has a PMH of afib on Xarelto. Last dose was 1100 this AM 7/20 per son. Pt is currently intubated. Pharmacy consulted to dose a heparin drip.  Goal of Therapy:  Heparin level 0.3-0.7 units/ml aPTT 66-102 seconds Monitor platelets by anticoagulation protocol: Yes    Plan:  Heparin level and APTT now correlate and are therapeutic - will monitor heparin levels. Continue current rate. Will confirm heparin level in 8 hours.  Hubbert Landrigan A. [place], [place].D., BCPS Clinical Pharmacist 03/17/2016,7:45 PM

## 2016-03-17 NOTE — Progress Notes (Signed)
Pt. Was suctioned prior to extubation for a small amount of white secretions. Per Dr. Alva Garnet order, she was extubated. No problems with the extubation. She was placed on 3 L nasal cannula. She has a strong voice and with no wheezes or stridor.

## 2016-03-17 NOTE — Progress Notes (Signed)
Initial Nutrition Assessment  DOCUMENTATION CODES:   Obesity unspecified  INTERVENTION:  -Await diet progression post extubation   NUTRITION DIAGNOSIS:   Inadequate oral intake related to acute illness as evidenced by NPO status.  GOAL:   Patient will meet greater than or equal to 90% of their needs  MONITOR:   Diet advancement, PO intake, Labs, Weight trends  REASON FOR ASSESSMENT:   Consult Enteral/tube feeding initiation and management  ASSESSMENT:    82 yo female admitted with acute on chronic respiratory failure with pulmonary edema requiring intubation on 7/20, extubated 7/21. Pt with hx of asthma, CHF, lung cancer s/p radiation in 2015. Pt with recent admission from 7/8 to 7/18 with CHF exacerbation and afib with RVR.   NPO at present, pt started on Vital High Protein at 40 ml/hr via Adult Tube Protocol on admission but orders discontinued upon extubation  Past Medical History  Diagnosis Date  . COPD (chronic obstructive pulmonary disease) ([place])   . Chronic diastolic CHF (congestive heart failure) ([place])     a. 10/2015 Echo: EF 55-65%, Gr1 DD, mild MR, mildly dil LA, nl RV fxn, nl PASP.  Marland Kitchen Hypertensive heart disease   . HLD (hyperlipidemia)   . Sleep apnea   . Cushing's disease ([place])   . Type II diabetes mellitus ([place])   . Anemia   . GERD (gastroesophageal reflux disease)   . History of hiatal hernia   . Migraine   . Arthritis   . Gout   . Depression   . Asthma   . Coronary artery disease     a. 11/2014 NSTEMI/PCI: LM nl, LAD 31m D1 30, LCX mild dzs, OM1 20p, OM2 24mOM3 90p (2.25x8 Promus Premier DES), RCA nl.   . HOH (hard of hearing)   . Chronic kidney disease (CKD), stage III (moderate)   . Cough     CHRONIC AT NIGHT  . Edema     FEET/LEGS  . History of pneumonia   . Lung cancer (HCCollinsvilledx'd 2014    S/P radiation 2015  . Diverticulitis   . Multiple allergies   . PAF (paroxysmal atrial fibrillation) (HCC)     a. on amio, Toprol XL, Cardizem CD,  and Eliquis 2.5 mg bid (age & SCr); b. CHADS2VASc ==> 7 (CHF, HTN, age x 2, DM, vascular disease and sex category)  . Hypertension    Diet Order:   NPO post extubation  Skin:  Reviewed, no issues  Last BM:  7/17  Labs: reviewed  Meds: lasix, ss novolog  Height:   Ht Readings from Last 1 Encounters:  03/16/16 '5\' 5"'$  (1.651 m)    Weight:   Wt Readings from Last 1 Encounters:  03/17/16 191 lb 2.2 oz (86.7 kg)    BMI:  Body mass index is 31.81 kg/(m^2).  Estimated Nutritional Needs:   Kcal:  175809-9833cals   Protein:  85-101 g  Fluid:  >/= 1.7 L  EDUCATION NEEDS:   No education needs identified at this time  CaSegundoRDBlue Clay FarmsLDBear [phone]  ([phone]/On-Call Pager

## 2016-03-18 ENCOUNTER — Encounter: Payer: Self-pay | Admitting: Cardiology

## 2016-03-18 ENCOUNTER — Inpatient Hospital Stay: Payer: Medicare Other

## 2016-03-18 ENCOUNTER — Inpatient Hospital Stay (HOSPITAL_COMMUNITY)
Admit: 2016-03-18 | Discharge: 2016-03-18 | Disposition: A | Discharge status: Home / Self Care | Payer: Medicare Other | Attending: Pulmonary Disease | Admitting: Pulmonary Disease

## 2016-03-18 DIAGNOSIS — I2510 Atherosclerotic heart disease of native coronary artery without angina pectoris: Secondary | ICD-10-CM

## 2016-03-18 DIAGNOSIS — I480 Paroxysmal atrial fibrillation: Secondary | ICD-10-CM

## 2016-03-18 DIAGNOSIS — I481 Persistent atrial fibrillation: Secondary | ICD-10-CM

## 2016-03-18 DIAGNOSIS — I5032 Chronic diastolic (congestive) heart failure: Secondary | ICD-10-CM

## 2016-03-18 DIAGNOSIS — J9622 Acute and chronic respiratory failure with hypercapnia: Secondary | ICD-10-CM

## 2016-03-18 DIAGNOSIS — J9600 Acute respiratory failure, unspecified whether with hypoxia or hypercapnia: Secondary | ICD-10-CM

## 2016-03-18 DIAGNOSIS — J9621 Acute and chronic respiratory failure with hypoxia: Secondary | ICD-10-CM

## 2016-03-18 DIAGNOSIS — J432 Centrilobular emphysema: Secondary | ICD-10-CM

## 2016-03-18 DIAGNOSIS — J441 Chronic obstructive pulmonary disease with (acute) exacerbation: Secondary | ICD-10-CM

## 2016-03-18 DIAGNOSIS — I420 Dilated cardiomyopathy: Secondary | ICD-10-CM | POA: Clinically undetermined

## 2016-03-18 DIAGNOSIS — I5033 Acute on chronic diastolic (congestive) heart failure: Secondary | ICD-10-CM

## 2016-03-18 DIAGNOSIS — I4819 Other persistent atrial fibrillation: Secondary | ICD-10-CM | POA: Diagnosis not present

## 2016-03-18 LAB — GLUCOSE, CAPILLARY
GLUCOSE-CAPILLARY: 192 mg/dL — AB (ref 65–99)
Glucose-Capillary: 107 mg/dL — ABNORMAL HIGH (ref 65–99)
Glucose-Capillary: 120 mg/dL — ABNORMAL HIGH (ref 65–99)
Glucose-Capillary: 163 mg/dL — ABNORMAL HIGH (ref 65–99)
Glucose-Capillary: 109 mg/dL — ABNORMAL HIGH (ref 65–99)
Glucose-Capillary: 178 mg/dL — ABNORMAL HIGH (ref 65–99)

## 2016-03-18 LAB — APTT: aPTT: 75 seconds — ABNORMAL HIGH (ref 24–36)

## 2016-03-18 LAB — COMPREHENSIVE METABOLIC PANEL
ALBUMIN: 3.2 g/dL — AB (ref 3.5–5.0)
ALK PHOS: 47 U/L (ref 38–126)
ALT: 69 U/L — AB (ref 14–54)
AST: 29 U/L (ref 15–41)
Anion gap: 7 (ref 5–15)
BILIRUBIN TOTAL: 1.1 mg/dL (ref 0.3–1.2)
BUN: 56 mg/dL — AB (ref 6–20)
CALCIUM: 8.1 mg/dL — AB (ref 8.9–10.3)
CO2: 32 mmol/L (ref 22–32)
CREATININE: 2.06 mg/dL — AB (ref 0.44–1.00)
Chloride: 102 mmol/L (ref 101–111)
GFR calc Af Amer: 25 mL/min — ABNORMAL LOW (ref >60)
GFR calc non Af Amer: 22 mL/min — ABNORMAL LOW (ref >60)
GLUCOSE: 107 mg/dL — AB (ref 65–99)
Potassium: 4.1 mmol/L (ref 3.5–5.1)
Sodium: 141 mmol/L (ref 135–145)
TOTAL PROTEIN: 5.6 g/dL — AB (ref 6.5–8.1)

## 2016-03-18 LAB — CBC
HEMATOCRIT: 27 % — AB (ref 35.0–47.0)
Hemoglobin: 9 g/dL — ABNORMAL LOW (ref 12.0–16.0)
MCH: 26.5 pg (ref 26.0–34.0)
MCHC: 33.3 g/dL (ref 32.0–36.0)
MCV: 79.6 fL — ABNORMAL LOW (ref 80.0–100.0)
Platelets: 152 10*3/uL (ref 150–440)
RBC: 3.39 MIL/uL — AB (ref 3.80–5.20)
RDW: 16.9 % — AB (ref 11.5–14.5)
WBC: 6 10*3/uL (ref 3.6–11.0)

## 2016-03-18 LAB — ECHOCARDIOGRAM COMPLETE
HEIGHTINCHES: 65 in
Weight: 3089.97 oz

## 2016-03-18 LAB — PROCALCITONIN: Procalcitonin: <0.1 ng/mL

## 2016-03-18 LAB — HEPARIN LEVEL (UNFRACTIONATED): HEPARIN UNFRACTIONATED: 0.35 [IU]/mL (ref 0.30–0.70)

## 2016-03-18 MED ORDER — AMIODARONE HCL IN DEXTROSE 360-4.14 MG/200ML-% IV SOLN
60.0000 mg/h | INTRAVENOUS | Status: DC
Start: 2016-03-18 — End: 2016-03-22
  Administered 2016-03-18 – 2016-03-22 (×12): 60 mg/h via INTRAVENOUS
  Filled 2016-03-18 (×31): qty 200

## 2016-03-18 MED ORDER — PANTOPRAZOLE SODIUM 40 MG PO TBEC
40.0000 mg | DELAYED_RELEASE_TABLET | Freq: Every day | ORAL | Status: DC
  Administered 2016-03-18 – 2016-03-29 (×12): 40 mg via ORAL
  Filled 2016-03-18 (×12): qty 1

## 2016-03-18 MED ORDER — ONDANSETRON HCL 4 MG/2ML IJ SOLN
4.0000 mg | Freq: Four times a day (QID) | INTRAMUSCULAR | Status: DC | PRN
  Administered 2016-03-18 – 2016-03-26 (×9): 4 mg via INTRAVENOUS
  Filled 2016-03-18 (×9): qty 2

## 2016-03-18 MED ORDER — INSULIN ASPART 100 UNIT/ML ~~LOC~~ SOLN
0.0000 [IU] | Freq: Three times a day (TID) | SUBCUTANEOUS | Status: DC
  Administered 2016-03-18: 3 [IU] via SUBCUTANEOUS
  Administered 2016-03-19: 2 [IU] via SUBCUTANEOUS
  Administered 2016-03-19: 3 [IU] via SUBCUTANEOUS
  Administered 2016-03-19: 2 [IU] via SUBCUTANEOUS
  Administered 2016-03-20: 3 [IU] via SUBCUTANEOUS
  Administered 2016-03-20: 2 [IU] via SUBCUTANEOUS
  Administered 2016-03-21: 3 [IU] via SUBCUTANEOUS
  Administered 2016-03-21 – 2016-03-22 (×5): 2 [IU] via SUBCUTANEOUS
  Administered 2016-03-23: 3 [IU] via SUBCUTANEOUS
  Administered 2016-03-24: 2 [IU] via SUBCUTANEOUS
  Administered 2016-03-24: 7 [IU] via SUBCUTANEOUS
  Administered 2016-03-25 – 2016-03-27 (×5): 2 [IU] via SUBCUTANEOUS
  Administered 2016-03-27: 3 [IU] via SUBCUTANEOUS
  Administered 2016-03-28: 2 [IU] via SUBCUTANEOUS
  Administered 2016-03-28: 3 [IU] via SUBCUTANEOUS
  Administered 2016-03-28 – 2016-03-29 (×2): 2 [IU] via SUBCUTANEOUS
  Administered 2016-03-29: 3 [IU] via SUBCUTANEOUS
  Administered 2016-03-30: 2 [IU] via SUBCUTANEOUS
  Filled 2016-03-18: qty 2
  Filled 2016-03-18: qty 3
  Filled 2016-03-18: qty 7
  Filled 2016-03-18: qty 2
  Filled 2016-03-18 (×2): qty 3
  Filled 2016-03-18: qty 2
  Filled 2016-03-18: qty 3
  Filled 2016-03-18 (×3): qty 2
  Filled 2016-03-18 (×2): qty 3
  Filled 2016-03-18: qty 2
  Filled 2016-03-18: qty 3
  Filled 2016-03-18: qty 1
  Filled 2016-03-18 (×5): qty 2
  Filled 2016-03-18: qty 3
  Filled 2016-03-18 (×6): qty 2

## 2016-03-18 MED ORDER — METOPROLOL TARTRATE 25 MG PO TABS
25.0000 mg | ORAL_TABLET | Freq: Four times a day (QID) | ORAL | Status: DC
  Administered 2016-03-18 – 2016-03-22 (×17): 25 mg via ORAL
  Filled 2016-03-18 (×18): qty 1

## 2016-03-18 MED ORDER — METOPROLOL TARTRATE 5 MG/5ML IV SOLN
2.5000 mg | INTRAVENOUS | Status: DC | PRN
  Administered 2016-03-18: 2.5 mg via INTRAVENOUS
  Filled 2016-03-18 (×3): qty 5

## 2016-03-18 MED ORDER — AMIODARONE HCL IN DEXTROSE 360-4.14 MG/200ML-% IV SOLN
60.0000 mg/h | INTRAVENOUS | Status: AC
  Administered 2016-03-18: 60 mg/h via INTRAVENOUS
  Filled 2016-03-18: qty 200

## 2016-03-18 MED ORDER — FUROSEMIDE 10 MG/ML IJ SOLN
40.0000 mg | Freq: Once | INTRAMUSCULAR | Status: AC
  Administered 2016-03-18: 40 mg via INTRAVENOUS
  Filled 2016-03-18: qty 4

## 2016-03-18 MED ORDER — RIVAROXABAN 15 MG PO TABS
15.0000 mg | ORAL_TABLET | Freq: Every day | ORAL | Status: DC
  Administered 2016-03-18 – 2016-03-20 (×3): 15 mg via ORAL
  Filled 2016-03-18 (×3): qty 1

## 2016-03-18 MED ORDER — AMIODARONE IV BOLUS ONLY 150 MG/100ML
150.0000 mg | Freq: Once | INTRAVENOUS | Status: AC
  Administered 2016-03-18: 150 mg via INTRAVENOUS
  Filled 2016-03-18: qty 100

## 2016-03-18 MED ORDER — INSULIN ASPART 100 UNIT/ML ~~LOC~~ SOLN: 0.0000 [IU] | Freq: Every day | SUBCUTANEOUS | Status: DC

## 2016-03-18 MED ORDER — ONDANSETRON HCL 4 MG/2ML IJ SOLN
INTRAMUSCULAR | Status: AC
  Administered 2016-03-18: 4 mg via INTRAVENOUS
  Filled 2016-03-18: qty 2

## 2016-03-18 NOTE — NC FL2 (Signed)
[place] LEVEL OF CARE SCREENING TOOL     IDENTIFICATION  Patient Name: Regina Burke Birthdate: [birth year] Sex: female Admission Date (Current Location): 03/16/2016  [place] and [place] Number:  Engineering geologist and Address:  [facility], [address] [place], [place] [address]      Provider Number: [id number]  Attending Physician Name and Address:  Flora Lipps, MD  Relative Name and Phone Number:       Current Level of Care: Hospital Recommended Level of Care: [place] Prior Approval Number:    Date Approved/Denied:   PASRR Number: [id number]  Discharge Plan: SNF    Current Diagnoses: Patient Active Problem List   Diagnosis Date Noted  . Persistent atrial fibrillation with RVR ([place]) 03/18/2016  . DNR (do not resuscitate) discussion   . Palliative care encounter   . Acute on chronic diastolic CHF (congestive heart failure), NYHA class 3 ([place]) 03/07/2016  . Paroxysmal Persistent atrial fibrillation ([place]): CHA2DS2-Vasc = ~6. On Xarelto 15 mg (age & renal Fxn)   . Chronic diastolic CHF (congestive heart failure) ([place]) 02/16/2016  . HLD (hyperlipidemia) 02/16/2016  . Iron deficiency anemia due to chronic blood loss   . Essential hypertension   . Chronic obstructive pulmonary disease with acute exacerbation ([place])   . SOB (shortness of breath)   . Acute on chronic renal failure ([place]) 01/19/2016  . Dyspnea 11/25/2015  . Diaphoresis   . Renal insufficiency   . Centrilobular emphysema ([place])   . Carcinoma of lung ([place]) 10/05/2015  . Chronic obstructive pulmonary disease ([place]) 07/14/2015  . Status post thoracentesis   . Renal failure   . S/P thoracentesis   . H/O: lung cancer   . Pleural effusion   . Respiratory failure ([place])   . Coronary artery disease involving native coronary artery of native heart without angina pectoris   . S/P coronary artery stent placement   . Pressure ulcer 06/08/2015  . Acute  kidney injury ([place]) 06/07/2015  . Chest wall pain 06/04/2015  . Acute on chronic respiratory failure with hypoxia (HCC) - secondary to COPD exacerbation, now with A. fib RVR 05/16/2015  . Dehydration 05/09/2015  . Diabetes ([place]) 05/09/2015  . Closed fracture nasal bone 05/09/2015  . Preoperative clearance 04/15/2015  . Cancer of upper lobe of right lung ([place]) 01/08/2015  . Allergic state 01/08/2015  . Absolute anemia 01/08/2015  . Chronic kidney disease 01/08/2015  . CAFL (chronic airflow limitation) ([place]) 01/08/2015  . Arthritis, degenerative 01/08/2015  . 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 Rapid palpitations   . Diabetes mellitus, type 2 ([place]) 09/11/2014  . Type 2 diabetes mellitus ([place]) 09/11/2014    Orientation RESPIRATION BLADDER Height & Weight     Self, Time, Situation, Place  O2 Continent Weight: 193 lb 2 oz (87.6 kg) Height:  '5\' 5"'$  (165.1 cm)  BEHAVIORAL SYMPTOMS/MOOD NEUROLOGICAL BOWEL NUTRITION STATUS    Convulsions/Seizures Continent Diet (Diabetic)  AMBULATORY STATUS COMMUNICATION OF NEEDS Skin   Limited Assist Verbally Normal                       Personal Care Assistance Level of Assistance  Bathing, Feeding, Dressing, Total care Bathing Assistance: Limited assistance Feeding assistance: Independent Dressing Assistance: Limited assistance Total Care Assistance: Limited assistance   Functional Limitations Info  Hearing, Speech, Sight Sight Info: Adequate Hearing Info: Adequate Speech Info: Adequate    SPECIAL CARE FACTORS FREQUENCY  PT (By licensed PT), OT (  By licensed OT)     PT Frequency: x5 OT Frequency: x5            Contractures      Additional Factors Info  Code Status, Allergies Code Status Info: DNR Allergies Info: Ciprofloxacin, Doxycycline, Penicillins, Sulfa Antibiotics, Morphine And Related, Albuterol Sulfate, Cefuroxime           Current Medications (03/18/2016):  This is the current hospital active medication  list Current Facility-Administered Medications  Medication Dose Route Frequency Provider Last Rate Last Dose  . 0.9 %  sodium chloride infusion  250 mL Intravenous PRN Awilda Bill, NP      . amiodarone (NEXTERONE PREMIX) 360-4.14 MG/200ML-% (1.8 mg/mL) IV infusion  60 mg/hr Intravenous Continuous Wilhelmina Mcardle, MD 33.3 mL/hr at 03/18/16 1318 60 mg/hr at 03/18/16 1318  . chlorhexidine gluconate (SAGE KIT) (PERIDEX) 0.12 % solution 15 mL  15 mL Mouth Rinse BID Flora Lipps, MD   15 mL at 03/17/16 0858  . fluticasone furoate-vilanterol (BREO ELLIPTA) 100-25 MCG/INH 1 puff  1 puff Inhalation Daily Awilda Bill, NP   1 puff at 03/18/16 1126  . heparin ADULT infusion 100 units/mL (25000 units/223m sodium chloride 0.45%)  1,200 Units/hr Intravenous Continuous Sheema M Hallaji, RPH 12 mL/hr at 03/18/16 1318 1,200 Units/hr at 03/18/16 1318  . insulin aspart (novoLOG) injection 0-15 Units  0-15 Units Subcutaneous TID WC DWilhelmina Mcardle MD   3 Units at 03/18/16 1317  . insulin aspart (novoLOG) injection 0-5 Units  0-5 Units Subcutaneous QHS DWilhelmina Mcardle MD      . ipratropium (ATROVENT) nebulizer solution 0.5 mg  0.5 mg Nebulization Q4H PRN DAwilda Bill NP   0.5 mg at 03/17/16 1557  . LORazepam (ATIVAN) injection 0.5 mg  0.5 mg Intravenous Q4H PRN DWilhelmina Mcardle MD      . metoprolol (LOPRESSOR) injection 2.5-5 mg  2.5-5 mg Intravenous Q3H PRN DWilhelmina Mcardle MD      . ondansetron ([facility] injection 4 mg  4 mg Intravenous Q6H PRN DWilhelmina Mcardle MD   4 mg at 03/18/16 1125  . pantoprazole (PROTONIX) EC tablet 40 mg  40 mg Oral Q1200 DWilhelmina Mcardle MD   40 mg at 03/18/16 1318  . Rivaroxaban (XARELTO) tablet 15 mg  15 mg Oral Q supper SPernell Dupre RPH      . tiotropium (SPIRIVA) inhalation capsule 18 mcg  18 mcg Inhalation Daily DAwilda Bill NP   18 mcg at 03/18/16 1126     Discharge Medications: Please see discharge summary for a list of discharge medications.  Relevant  Imaging Results:  Relevant Lab Results:   Additional Information  SSN ##[id number] BJoana Reamer LSouth Monument

## 2016-03-18 NOTE — Progress Notes (Signed)
Foley removed without complication.  Order was placed earlier to remove foley, but pt requested that foley remain in place since lasix administered.

## 2016-03-18 NOTE — Progress Notes (Signed)
Pt is alert and oriented, no complaints of pain.  A-fib on monitor, rate 120-135.  Pt received amiodarone bolus, and amiodarone infusion increased to 33.3 ml/hr (to remain at this rate until discontinued).  Po metoprolol ordered per Dr. Ellyn Hack.  On 2-3L [place].  Pt up in chair and to [facility] today, some work with PT.  Pt transitioned from heparin drip (now discontinued) to xarelto.  VSS, afebrile.  Foley removed, pt has voided post foley removal.  Remains Stepdown status at this time.

## 2016-03-18 NOTE — Progress Notes (Signed)
ANTICOAGULATION CONSULT NOTE - Initial Consult  Pharmacy Consult for Rivaroxaban Indication: atrial fibrillation  Allergies  Allergen Reactions  . Ciprofloxacin Shortness Of Breath, Itching and Rash  . Doxycycline Shortness Of Breath, Itching and Rash  . Penicillins Shortness Of Breath, Itching, Rash and Other (See Comments)    Has patient had a PCN reaction causing immediate rash, facial/tongue/throat swelling, SOB or lightheadedness with hypotension: Yes Has patient had a PCN reaction causing severe rash involving mucus membranes or skin necrosis: No Has patient had a PCN reaction that required hospitalization No Has patient had a PCN reaction occurring within the last 10 years: No If all of the above answers are "NO", then may proceed with Cephalosporin use.  . Sulfa Antibiotics Shortness Of Breath, Itching and Rash  . Morphine And Related Itching  . Albuterol Sulfate   . Cefuroxime Rash    blisters   Patient Measurements: Height: '5\' 5"'$  (165.1 cm) Weight: 193 lb 2 oz (87.6 kg) IBW/kg (Calculated) : 57  Vital Signs: Temp: 97.6 F (36.4 C) (07/22 0730) Temp Source: Axillary (07/22 0730) BP: 128/74 mmHg (07/22 0800) Pulse Rate: 121 (07/22 0800)  Labs:  Recent Labs  03/16/16 1419 03/16/16 1535  03/17/16 0520 03/17/16 0630 03/17/16 1844 03/18/16 0524  HGB 10.8*  --   --   --  9.5*  --  9.0*  HCT 33.8*  --   --   --  28.4*  --  27.0*  PLT 320  --   --   --  207  --  152  APTT  --   --   < > 33 33 55* 75*  LABPROT 20.5*  --   --   --   --   --   --   INR 1.76  --   --   --   --   --   --   HEPARINUNFRC  --   --   --  0.40 0.35  --  0.35  CREATININE  --  1.89*  --   --  2.18*  --  2.06*  TROPONINI <0.03  --   --   --   --   --   --   < > = values in this interval not displayed.  Estimated Creatinine Clearance: 23.8 mL/min (by C-G formula based on Cr of 2.06).   Medical History: Past Medical History  Diagnosis Date  . COPD (chronic obstructive pulmonary disease)  ([place])   . Chronic diastolic CHF (congestive heart failure) ([place])     a. 10/2015 Echo: EF 55-65%, Gr1 DD, mild MR, mildly dil LA, nl RV fxn, nl PASP.  Marland Kitchen Hypertensive heart disease   . HLD (hyperlipidemia)   . Sleep apnea   . Cushing's disease ([place])   . Type II diabetes mellitus ([place])   . Anemia   . GERD (gastroesophageal reflux disease)   . History of hiatal hernia   . Migraine   . Arthritis   . Gout   . Depression   . Asthma   . Coronary artery disease     a. 11/2014 NSTEMI/PCI: LM nl, LAD 6m D1 30, LCX mild dzs, OM1 20p, OM2 228mOM3 90p (2.25x8 Promus Premier DES), RCA nl.   . HOH (hard of hearing)   . Chronic kidney disease (CKD), stage III (moderate)   . Cough     CHRONIC AT NIGHT  . Edema     FEET/LEGS  . History of pneumonia   . Lung cancer ([facility]  dx'd 2014    S/P radiation 2015  . Diverticulitis   . Multiple allergies   . PAF (paroxysmal atrial fibrillation) (HCC)     a. on amio, Toprol XL, Cardizem CD, and Eliquis 2.5 mg bid (age & SCr); b. CHADS2VASc ==> 7 (CHF, HTN, age x 2, DM, vascular disease and sex category)  . Hypertension     Assessment: 82 yo female with acute respiratory failure. Patient has been on heparin drip inpatient, but was managed on rivaroxaban '15mg'$  daily at home for A. Fib. Pharmacy consulted for rivaroxaban restart.     Plan:  Will restart patient's home dose of rivaroxaban '15mg'$  with supper. This dose is appropriate based on patient's CrCl 15-84m/min.  Heparin ordered to stop at 1659, prior to rivaroxaban administration.   SNancy Fetter PharmD Clinical Pharmacist 03/18/2016 9:55 AM

## 2016-03-18 NOTE — Progress Notes (Signed)
Patient slept with bipap overnight,back on 2L per [place] with sats in mid to high 90s. No evident respiratory distress at this time. Continue on heparin and amiodorone gtt.

## 2016-03-18 NOTE — Clinical Social Work Note (Signed)
Clinical Social Work Assessment  Patient Details  Name: Regina Burke MRN: [id number] Date of Birth: December 12, 1932  Date of referral:  03/18/16               Reason for consult:  Facility Placement                Permission sought to share information with:  Family Supports, Customer service manager Permission granted to share information::  Yes, Verbal Permission Granted  Name::     Adonis Housekeeper, Marge Duncans and Marveen Reeks ( Adult children) [phone]  Agency::  All Facilities   Relationship::  yes  Contact Information:  yes  Housing/Transportation Living arrangements for the past 2 months:  [place] of Information:  Patient Patient Interpreter Needed:  None Criminal Activity/Legal Involvement Pertinent to Current Situation/Hospitalization:  No - Comment as needed Significant Relationships:  Adult Children Lives with:  Self Do you feel safe going back to the place where you live?  Yes Need for family participation in patient care:  Yes (Comment)  Care giving concerns: Would like to be placed a SNF for Short term rehab  Social Worker assessment / plan: LCSW introduced myself to patient. She is oriented x4. Patient reports she was pretty active until a year ago and now she struggles to walk even short distances. She used to work at [employer] and is retired. She has resided at home with her youngest son for some time. Her vision is poor she has 1 catarac surgery last year, hearing and speech is good.She is on a diabetic diet and has 2 litres of 02 at home. She is not incontinent. LCSW provided a SNF handout and was given verbal consent to speak to all ASNF facilities. Her insurance in [employer]  She has excellent home support and all 4 adult children are active in her care.  Employment status:  Retired Forensic scientist:   Research officer, political party) PT Recommendations:  Not assessed at this time Information / Referral to community resources:   [place]  Patient/Family's Response to care:  She understands that going to STR at [facility] will be better for her.  Patient/Family's Understanding of and Emotional Response to Diagnosis, Current Treatment, and Prognosis:  Patient has an good understanding of SNF and the process. Other LCSW will present bed offers before discharge.  Emotional Assessment Appearance:  Appears stated age Attitude/Demeanor/Rapport:   (Pleasant and calm) Affect (typically observed):  Accepting, Adaptable, Happy Orientation:  Oriented to Self, Oriented to Place, Oriented to  Time, Oriented to Situation Alcohol / Substance use:  Never Used Psych involvement (Current and /or in the community):  No (Comment)  Discharge Needs  Concerns to be addressed:  No discharge needs identified Readmission within the last 30 days:  No Current discharge risk:  None Barriers to Discharge:  No Barriers Identified   Joana Reamer, LCSW 03/18/2016, 1:55 PM

## 2016-03-18 NOTE — Progress Notes (Signed)
Patient continue on amiodarone gtt at 33.3 ml/hr. Remains Afib in the 100s. Requested BiPAP for sleep. Awake and calls out for assistance as needed.  Up to [facility] to void without difficulty sp Foley removal earlier. Denies pain or discomfort. No labored breathing noted but DOE.

## 2016-03-18 NOTE — Progress Notes (Signed)
PULMONARY / CRITICAL CARE MEDICINE   Name: Regina Burke MRN: [id number] DOB: 1932-09-22    ADMISSION DATE:  03/16/2016   PT PROFILE: Recurrent acute on chronic hypoxic and hypercarbic respiratory failure due to CHF in setting of PAF with RVR. CXR consistent with CHF  MAJOR EVENTS/TEST RESULTS: 07/08 - 07/18: hospitalized for acute on chronic respiratory failure, pulmonary edema, PAF 07/20 presented to ED with respiratory distress and pulmonary edema. Intubated in ED and admitted to ICU 07/21 AFRVR > amiodarone infusion initiated. Passed SBT and extubated 07/22 Cardiology Consultation: rebolused with amiodarone and infusion rate increased 07/22 echocardiogram:   INDWELLING DEVICES:: ETT 07/20 >> 07/21  MICRO DATA: Blood 07/20 >>    ANTIMICROBIALS:    SUBJECTIVE:  No acute issues. Off vasopressors. Remains in   VITAL SIGNS: BP 110/52 mmHg  Pulse 126  Temp(Src) 97.6 F (36.4 C) (Axillary)  Resp 44  Ht '5\' 5"'$  (1.651 m)  Wt 193 lb 2 oz (87.6 kg)  BMI 32.14 kg/m2  SpO2 98%  HEMODYNAMICS:    VENTILATOR SETTINGS: Vent Mode:  [-]  FiO2 (%):  [3 %] 3 %  INTAKE / OUTPUT: I/O last 3 completed shifts: In: 1176.4 [I.V.:685.1; NG/GT:441.3; IV Piggyback:50] Out: 4097 [Urine:1015]  PHYSICAL EXAMINATION: General:  NAD Neuro: No focal deficits HEENT:  WNL Cardiovascular: Tachy, IRIR, no M Lungs: L>R basilar crackles, no wheezes Abdomen:  Obese, soft Extremities: warm, no edema  LABS:  BMET  Recent Labs Lab 03/16/16 1535 03/17/16 0630 03/18/16 0524  NA 142 142 141  K 4.8 4.2 4.1  CL 102 104 102  CO2 32 31 32  BUN 48* 60* 56*  CREATININE 1.89* 2.18* 2.06*  GLUCOSE 182* 152* 107*    Electrolytes  Recent Labs Lab 03/16/16 1419 03/16/16 1535 03/16/16 1840 03/17/16 0500 03/17/16 0630 03/18/16 0524  CALCIUM  --  8.5*  --   --  8.2* 8.1*  MG 2.4  --  2.3 2.1  --   --   PHOS  --   --  3.6 3.0  --   --     CBC  Recent Labs Lab 03/16/16 1419  03/17/16 0630 03/18/16 0524  WBC 13.8* 10.0 6.0  HGB 10.8* 9.5* 9.0*  HCT 33.8* 28.4* 27.0*  PLT 320 207 152    Coag's  Recent Labs Lab 03/16/16 1419  03/17/16 0630 03/17/16 1844 03/18/16 0524  APTT  --   < > 33 55* 75*  INR 1.76  --   --   --   --   < > = values in this interval not displayed.  Sepsis Markers  Recent Labs Lab 03/16/16 1840 03/17/16 0630 03/18/16 0524  PROCALCITON 0.13 0.22 <0.10    ABG  Recent Labs Lab 03/16/16 1404 03/17/16 0456  PHART 7.27* 7.41  PCO2ART 78* 54*  PO2ART 47* 97    Liver Enzymes  Recent Labs Lab 03/16/16 1535 03/18/16 0524  AST 57* 29  ALT 102* 69*  ALKPHOS 56 47  BILITOT 0.8 1.1  ALBUMIN 3.7 3.2*    Cardiac Enzymes  Recent Labs Lab 03/16/16 1419  TROPONINI <0.03    Glucose  Recent Labs Lab 03/17/16 1559 03/17/16 1938 03/17/16 2336 03/18/16 0401 03/18/16 0711 03/18/16 1104  GLUCAP 107* 122* 125* 107* 120* 192*    CXR: NSC CM, bibasilar opacities c/w atx and/or effusions   IMPRESSION: Acute on chronic hypoxic/hypercapnic respiratory failure Pulmonary edema Hx: Asthma, lung cancer s/p radiation 2015 PAF with RVR Diastolic CHF Hx: CAD and  NSTEMI (2016), Hypertension   AKI CKD Stage III   Hx: GERD and diverticulitis  H/o constipation Anemia without acute blood loss DM 2  PLAN: Cont nocturnal BiPAP, supplemental O2 Breo, tiotropium initiated 07/21 Continue amiodarone infusion Low dose PRM metoprolol Cardiology consultation requested 07/22 Change SSI to ACHS Change heparin to rivaroxaban   Merton Border, MD PCCM service Mobile [phone] Pager [phone] 03/18/2016

## 2016-03-18 NOTE — Evaluation (Signed)
Physical Therapy Evaluation Patient Details Name: Regina Burke MRN: [id number] DOB: 10-22-1932 Today's Date: 03/18/2016   History of Present Illness  82 yo female with a PMH of Type II diabetes, anemia, GERD, cushing's disease, asthma, CKD stage III, pneumonia, edema, paroxysmal atrial fibrillation, HTN, lung cancer s/p radiation 2015, CAD, NSTEMI, HTN, diverticulitis, and gout. She presented to [facility] ER via EMS on 7/20 with c/o worsening shortness of breath. She was recently admitted to the hospital on 03/04/16 and discharged 03/14/16 for CHF exacerbation and Afibb/RVR and was discharged on her normal supplemental oxygen of 3L.   Clinical Impression  Pt shows good willingness to participate and nursing okays PT despite elevated HR.  Pt generally has been ~120 bpm t/o the day and this increases to high 130s with light in bed exercises and pt needs rest breaks and generally shows significant fatigue with each exercise.  Pt not appropriate for mobility/ambulation at this time.     Follow Up Recommendations SNF    Equipment Recommendations       Recommendations for Other Services       Precautions / Restrictions Precautions Precautions: Fall Restrictions Weight Bearing Restrictions: No      Mobility  Bed Mobility               General bed mobility comments: deferred bed mobility today as pt's HR was increasing to the 130s with simple in-bed exercises  Transfers                    Ambulation/Gait                Stairs            Wheelchair Mobility    Modified Rankin (Stroke Patients Only)       Balance                                             Pertinent Vitals/Pain Pain Assessment: No/denies pain    Home Living Family/patient expects to be discharged to:: Skilled nursing facility Living Arrangements: Children Available Help at Discharge: Family Type of Home: House Home Access: Stairs to enter Entrance  Stairs-Rails: Right Entrance Stairs-Number of Steps: 1 step to get onto porch, one step to get into house, one step inside house between rooms. Can hold onto wall for support with each step. Home Layout: One level Home Equipment: Walker - 2 wheels;Cane - single point;Shower seat;Grab bars - tub/shower      Prior Function Level of Independence: Independent with assistive device(s)         Comments: Patient has been using SPC and RW recently. Decreasing tolerance for OOB mobility due to shortness of breath.      Hand Dominance        Extremity/Trunk Assessment   Upper Extremity Assessment: Generalized weakness (very weak, limited shoulder elevation/mobility)           Lower Extremity Assessment: Generalized weakness (again pt very weak/fatigued - grossly 3+/5)         Communication   Communication: No difficulties  Cognition Arousal/Alertness: Awake/alert Behavior During Therapy: WFL for tasks assessed/performed Overall Cognitive Status: Within Functional Limits for tasks assessed                      General Comments      Exercises General Exercises -  Lower Extremity Ankle Circles/Pumps: AROM;10 reps;Both Quad Sets: AROM;10 reps;Both Heel Slides: AROM;Both;Supine;10 reps Hip ABduction/ADduction: AROM;Both;Supine;10 reps Other Exercises Other Exercises: `      Assessment/Plan    PT Assessment Patient needs continued PT services  PT Diagnosis Difficulty walking;Generalized weakness   PT Problem List Decreased strength;Decreased activity tolerance;Cardiopulmonary status limiting activity;Decreased balance;Decreased mobility  PT Treatment Interventions DME instruction;Gait training;Stair training;Therapeutic activities;Therapeutic exercise;Balance training   PT Goals (Current goals can be found in the Care Plan section) Acute Rehab PT Goals Patient Stated Goal: I know I need rehab now PT Goal Formulation: With patient Time For Goal Achievement:  04/01/16 Potential to Achieve Goals: Fair    Frequency Min 2X/week   Barriers to discharge        Co-evaluation               End of Session Equipment Utilized During Treatment: Oxygen Activity Tolerance: Patient limited by fatigue Patient left: in bed;with nursing/sitter in room;with call bell/phone within reach           Time: 1453-1510 PT Time Calculation (min) (ACUTE ONLY): 17 min   Charges:   PT Evaluation $PT Eval Low Complexity: 1 Procedure     PT G CodesKreg Shropshire, DPT 03/18/2016, 4:57 PM

## 2016-03-18 NOTE — Consult Note (Signed)
Cardiology Consult    Patient ID: Regina Burke MRN: [id number], DOB/AGE: [id number]   Admit date: 03/16/2016 Date of Consult: 03/18/2016  Primary Physician: Tracie Harrier, MD Primary Cardiologist: Dr. Ida Rogue Requesting Provider: Flora Lipps, MD -> Dr. Alva Garnet  Patient Profile    Regina Burke is a pleasant 82 year old woman with long-standing patient of our group with CAD-PCI and a known history of long-standing difficult control hypertension exacerbated by COPD. She has been resistant to cardioversion in the past. She had an admission in April 2016 for chest pain and had PCI to OM branch. She has intermittently been off of anticoagulation secondary to her anemia. In addition to COPD she has long cancer history as well as type 2 diabetes. She's had several admissions for acute respiratory distress with pneumonia and effusions. Although her A. fib episodes have been frequent and she has CAD, she is been known to have a normal EF of 55-60% in the past. She had an episode of digoxin toxicity noted in August 2016 in setting of renal failure. She then was readmitted in October 2016 with severely elevated potassium and renal failure as well as A. fib RVR. She has been now maintained on 200 mg twice a day little amiodarone and metoprolol succinate along with diltiazem. Despite this, her heart rate is been difficult control.  She also has chronic renal insufficiency - creatinine roughly 2.0 now.  She was last in the hospital earlier this month on July 8 for one of her exacerbations and was found to be in persistent A. fib with RVR. She was actually seen by Dr. Caryl Comes from EP who discussed the possibility of potentially nodal ablation.  She actually went for cardioversion and maintained sinus rhythm less than 2 hours and reverted back to A. Fib. -- Also important to note that she had a very difficult time coming out of her sedation following cardioversion.   Past Medical History    Past Medical History  Diagnosis Date  . COPD (chronic obstructive pulmonary disease) ([place])   . Chronic diastolic CHF (congestive heart failure) ([place])     a. 10/2015 Echo: EF 55-65%, Gr1 DD, mild MR, mildly dil LA, nl RV fxn, nl PASP.  Marland Kitchen Hypertensive heart disease   . HLD (hyperlipidemia)   . Sleep apnea   . Cushing's disease ([place])   . Type II diabetes mellitus ([place])   . Anemia   . GERD (gastroesophageal reflux disease)   . History of hiatal hernia   . Migraine   . Arthritis   . Gout   . Depression   . Asthma   . Coronary artery disease     a. 11/2014 NSTEMI/PCI: LM nl, LAD 53m D1 30, LCX mild dzs, OM1 20p, OM2 282mOM3 90p (2.25x8 Promus Premier DES), RCA nl.   . HOH (hard of hearing)   . Chronic kidney disease (CKD), stage III (moderate)   . Cough     CHRONIC AT NIGHT  . Edema     FEET/LEGS  . History of pneumonia   . Lung cancer (HCPalestinedx'd 2014    S/P radiation 2015  . Diverticulitis   . Multiple allergies   . PAF (paroxysmal atrial fibrillation) (HCC)     a. on amio, Toprol XL, Cardizem CD, and Eliquis 2.5 mg bid (age & SCr); b. CHADS2VASc ==> 7 (CHF, HTN, age x 2, DM, vascular disease and sex category)  . Hypertension     Past Surgical History  Procedure Laterality Date  .  Adrenalectomy Left 1980's    "[place]"  . Abdominal hysterectomy    . Appendectomy    . Cholecystectomy    . Coronary angioplasty with stent placement  12/23/2014  . Tonsillectomy    . Fracture surgery    . Wrist fracture surgery Bilateral ~ 2000  . Breast cyst excision Left   . Tubal ligation    . Left heart catheterization with coronary angiogram N/A 12/23/2014    Procedure: LEFT HEART CATHETERIZATION WITH CORONARY ANGIOGRAM;  Surgeon: Burnell Blanks, MD;  Location[facility] CATH LAB;  Service: Cardiovascular;  Laterality: N/A;  . Percutaneous coronary stent intervention (pci-s)  12/23/2014    Procedure: PERCUTANEOUS CORONARY STENT INTERVENTION (PCI-S);  Surgeon: Burnell Blanks,  MD;  Location: [facility] CATH LAB;  Service: Cardiovascular;;  Promus 2.25x8  . Cataract extraction w/phaco Right 12/28/2015    Procedure: CATARACT EXTRACTION PHACO AND INTRAOCULAR LENS PLACEMENT (IOC);  Surgeon: Birder Robson, MD;  Location: [facility] ORS;  Service: Ophthalmology;  Laterality: Right;  [place] 48.4   . Electrophysiologic study N/A 03/08/2016    Procedure: CARDIOVERSION;  Surgeon: Wende Bushy, MD;  Location: [facility] ORS;  Service: Cardiovascular;  Laterality: N/A;  . Electrophysiologic study N/A 03/07/2016    Procedure: Cardioversion;  Surgeon: Wende Bushy, MD;  Location: [facility] ORS;  Service: Cardiovascular;  Laterality: N/A;  . Transthoracic echocardiogram  11/26/2015    Technically difficult study. EF 55-60%. Normal wall motion. GR 1 DD.     Allergies  Allergies  Allergen Reactions  . Ciprofloxacin Shortness Of Breath, Itching and Rash  . Doxycycline Shortness Of Breath, Itching and Rash  . Penicillins Shortness Of Breath, Itching, Rash and Other (See Comments)    Has patient had a PCN reaction causing immediate rash, facial/tongue/throat swelling, SOB or lightheadedness with hypotension: Yes Has patient had a PCN reaction causing severe rash involving mucus membranes or skin necrosis: No Has patient had a PCN reaction that required hospitalization No Has patient had a PCN reaction occurring within the last 10 years: No If all of the above answers are "NO", then may proceed with Cephalosporin use.  . Sulfa Antibiotics Shortness Of Breath, Itching and Rash  . Morphine And Related Itching  . Albuterol Sulfate   . Cefuroxime Rash    blisters    History of Present Illness    Regina Burke is now back in the hospital - admitted July 20 with worsening dyspnea. She was actually just discharged on the 18th on home oxygen. Upon EMS transfer she was being treated with BiPAP for hypoxic and hypercapnic respiratory failure.  She was found to be in pulmonary edema with accommodation COPD exacerbation/CHF  exacerbation and was still in A. fib RVR --> she was admitted to the ICU and intubated. She is now extubated on nasal cannula.  We are now re-consulted as she has gone back into very rapid heart rates. She was started on amiodarone bolus and drip last night, but is maintained heart rates in the 130s currently. She denies any chest tightness or pressure but has had significant coughing.  Cardiovascular ROS: positive for - dyspnea on exertion, irregular heartbeat, orthopnea, paroxysmal nocturnal dyspnea, rapid heart rate and shortness of breath negative for - chest pain, edema, murmur, palpitations or Syncope/near syncope or TIA/amaurosis fugax. No recurrent bleeding. No melena, hematochezia or hematuria.  MAJOR EVENTS/TEST RESULTS: 07/08 - 07/18: hospitalized for acute on chronic respiratory failure, pulmonary edema, PAF 07/20 presented to ED with respiratory distress and pulmonary edema. Intubated in ED and admitted to  ICU 07/21 AFRVR > amiodarone infusion initiated. Passed SBT and extubated 07/22 Cardiology Consultation: rebolused with amiodarone and infusion rate increased 07/22 echocardiogram:   INDWELLING DEVICES:: ETT 07/20 >> extubated 07/21  Inpatient Medications    . chlorhexidine gluconate (SAGE KIT)  15 mL Mouth Rinse BID  . fluticasone furoate-vilanterol  1 puff Inhalation Daily  . insulin aspart  0-15 Units Subcutaneous TID WC  . insulin aspart  0-5 Units Subcutaneous QHS  . metoprolol tartrate  25 mg Oral QID  . pantoprazole  40 mg Oral Q1200  . rivaroxaban  15 mg Oral Q supper  . tiotropium  18 mcg Inhalation Daily    Family History    Family History  Problem Relation Age of Onset  . Heart disease Mother   . Diabetes Mother   . Osteoarthritis Mother   . Hypertension Mother   . Heart disease Father   . Hypertension Father   . COPD Brother     Social History    Social History   Social History  . Marital Status: Widowed    Spouse Name: N/A  . Number of  Children: N/A  . Years of Education: N/A   Occupational History  . retired    Social History Main Topics  . Smoking status: Former Smoker -- 1.00 packs/day for 45 years    Types: Cigarettes    Quit date: 04/25/1998  . Smokeless tobacco: Never Used  . Alcohol Use: No     Comment: 12/23/2014 "might have a couple mixed drinks/year"  . Drug Use: No  . Sexual Activity: No   Other Topics Concern  . Not on file   Social History Narrative   Lives locally with son.  Does not routinely exercise.     Review of Systems    General:  No chills, fever, night sweats or weight changes.  Cardiovascular:  See HPI Dermatological: No rash, lesions/masses Respiratory: ++ Cough & Dyspnea Urologic: No hematuria, dysuria Abdominal:   + Nausea without vomiting; No diarrhea, bright red blood per rectum, melena, or hematemesis Neurologic:  No visual changes, wkns, changes in mental status. All other systems reviewed and are otherwise negative except as noted above.  Physical Exam    Blood pressure 123/92, pulse 129, temperature 98.4 F (36.9 C), temperature source Oral, resp. rate 15, height '5\' 5"'$  (1.651 m), weight 193 lb 2 oz (87.6 kg), SpO2 98 %.  General: Pleasant, chronically ill-appearing. Mild distress with increased worker breathing Psych: Normal affect.  Seems to be somewhat frustrated now. Neuro: Alert and oriented X 3. Moves all extremities spontaneously. HEENT: [place]/AT, EOMI, MMM, anicteric sclera  Neck: Supple without bruits - mild JVD with a fibrillary cannon A waves. Lungs:  Diffuse coarse basal rhonchi/rales L>R. Mostly nonlabored however. Heart: Rapid, irregularly irregular rhythm. Unable to really determine if there are any M/R/G. Possibly split S2  Abdomen: Soft, non-tender, non-distended, BS + x 4.  Extremities: No clubbing, cyanosis with minimal edema. DP/PT/Radials 2+ and equal bilaterally.  Labs    Troponin (Point of Care Test) No results for input(s): TROPIPOC in the last  72 hours.  Recent Labs  03/16/16 1419  TROPONINI <0.03   Lab Results  Component Value Date   WBC 6.0 03/18/2016   HGB 9.0* 03/18/2016   HCT 27.0* 03/18/2016   MCV 79.6* 03/18/2016   PLT 152 03/18/2016     Recent Labs Lab 03/18/16 0524  NA 141  K 4.1  CL 102  CO2 32  BUN 56*  CREATININE 2.06*  CALCIUM 8.1*  PROT 5.6*  BILITOT 1.1  ALKPHOS 47  ALT 69*  AST 29  GLUCOSE 107*   Lab Results  Component Value Date   CHOL 202* 12/23/2014   HDL 28* 12/23/2014   LDLCALC 107* 12/23/2014   TRIG 337* 12/23/2014   No results found for: [facility]   Radiology Studies   Dg Chest Port 1 View  03/18/2016  CLINICAL DATA:  Respiratory failure. EXAM: PORTABLE CHEST 1 VIEW COMPARISON:  03/17/2016 and CT chest 03/07/2016, 01/21/2016. FINDINGS: Interval extubation. Nasogastric tube is been removed as well. Left IJ central line tip projects over the SVC. Basilar dependent airspace opacification with left lower lobe collapse/ consolidation. Small right pleural effusion and moderate left pleural effusion. Oblong opacity in the apex of the right hemi thorax is seen with fiducial markers, stable. IMPRESSION: 1. Basilar dependent airspace opacification with bilateral pleural effusions. Findings may be due to congestive heart failure. 2. Left lower lobe collapse/consolidation. 3. Oblong opacity with fiducial markers in the right upper lobe, better evaluated on cross-sectional imaging 03/07/2016 and 01/21/2016. Electronically Signed   By: Lorin Picket M.D.   On: 03/18/2016 07:59    ECG & Cardiac Imaging    Telemetry shows continued A. fib RVR rates in the 130 range. Underlying LBBB   03/18/2016 -Relook Echocardiogram while in A. fib RVR: Very difficult to interpret due to A. fib and LBBB   Suggests reduced EF to 3540% with diffuse hypokinesis, but unable to determine diastolic function. There is mild MR. Moderate LA dilation. Normal RV systolic function. Recommend is to repeat limited echo with  Definity once heart rate has improved.   Assessment & Plan   Principal Problem:   Acute on chronic respiratory failure with hypoxia and hypercapnia (HCC) - secondary to COPD exacerbation, now with A. fib RVR Active Problems:   Coronary artery disease involving native coronary artery of native heart without angina pectoris   Chronic obstructive pulmonary disease with acute exacerbation (HCC)   Chronic diastolic CHF (congestive heart failure) (HCC)   Acute on chronic diastolic CHF (congestive heart failure), NYHA class 3 (HCC)   Persistent atrial fibrillation ([place]): CHA2DS2-Vasc = ~7. On Xarelto 15 mg (age & renal Fxn)   Congestive dilated cardiomyopathy ([place])   Centrilobular emphysema ([place])   Patient is well-known to our service and the pulmonary medicine service. She now has again acute combined systolic and diastolic heart failure with A. fib RVR. Unfortunately now we'll see her EF is down. I am not sure what to make of the reduced ejection fraction as A. fib RVR and LBBB make it very difficult to interpret. I'm not surprised that her EF is low with LBBB and RVR. Would be better to read check once her rate is controlled. This clearly would indicate that she probably would benefit from AV nodal ablation. It may be that we need to consider this sooner than later.  Principal Problem:   Acute on chronic respiratory failure with hypoxia and hypercapnia (HCC) - secondary to COPD exacerbation, now with A. fib RVR  Will probably need some Lasix. Not currently on standing dose.  Nighttime BiPAP to avoid hypercapnic failure.    Acute on chronic systolic and diastolic CHF (congestive heart failure), NYHA class 3 (HCC) - now with newly identified low EF on Echo -- Congestive dilated cardiomyopathy (HCC)  Will add low-dose short-acting beta blocker for now, and convert back to Toprol  Will need at least standing dose of by mouth Lasix -  is on 20 mg twice a day at home.  Until EF has improved,  would not go back to using diltiazem.      Persistent atrial fibrillation with RVR(HCC): CHA2DS2-Vasc = ~7. On Xarelto 15 mg (age & renal Fxn)  Currently using amiodarone for rate control. I gave an additional bolus of 150 mg IV 1. Was not adequately maintained with 200 twice a day amiodarone at home - I suspect she may very well need AV nodal ablation in the future, however needs to be stabilized. I don't think that attempting cardioversion again is worthwhile since she reverted right back to A. fib.  Back on Xarelto reduced dose. Would review dosing with pharmacy, but I think this should be fine at this point.  No bleeding issues.    Coronary artery disease involving native coronary artery of native heart without angina pectoris - stable with no anginal symptoms. She only had single-vessel disease which makes me think that the new the reduced EF is probably not related to CAD and more related to A. fib RVR and LBBB.  Was on atorvastatin and home. Make sure she is back on it when she is discharged.  Is artery on beta blocker. Not on antiplatelet agents because of intake regulation for A. fib     Centrilobular emphysema (HCC) / Chronic obstructive pulmonary disease with acute exacerbation ([place])  Per pulmonology/PCCM    Signed,  Leonie Man, M.D., M.S.  [employer]  [address] [place], [place] [address] [phone] Fax [phone]

## 2016-03-18 NOTE — Progress Notes (Signed)
LCSW sent out information to look for SNF for short term rehab-Awaiting PT consult. Will re-send Pt consult once bed offers are made.  [employer] LCSW [phone]

## 2016-03-19 ENCOUNTER — Inpatient Hospital Stay: Payer: Medicare Other

## 2016-03-19 DIAGNOSIS — I5043 Acute on chronic combined systolic (congestive) and diastolic (congestive) heart failure: Secondary | ICD-10-CM

## 2016-03-19 LAB — BASIC METABOLIC PANEL
ANION GAP: 7 (ref 5–15)
BUN: 58 mg/dL — AB (ref 6–20)
CHLORIDE: 100 mmol/L — AB (ref 101–111)
CO2: 33 mmol/L — ABNORMAL HIGH (ref 22–32)
Calcium: 8 mg/dL — ABNORMAL LOW (ref 8.9–10.3)
Creatinine, Ser: 2.15 mg/dL — ABNORMAL HIGH (ref 0.44–1.00)
GFR calc Af Amer: 24 mL/min — ABNORMAL LOW (ref >60)
GFR, EST NON AFRICAN AMERICAN: 21 mL/min — AB (ref >60)
GLUCOSE: 121 mg/dL — AB (ref 65–99)
POTASSIUM: 3.9 mmol/L (ref 3.5–5.1)
Sodium: 140 mmol/L (ref 135–145)

## 2016-03-19 LAB — GLUCOSE, CAPILLARY
GLUCOSE-CAPILLARY: 185 mg/dL — AB (ref 65–99)
Glucose-Capillary: 122 mg/dL — ABNORMAL HIGH (ref 65–99)
Glucose-Capillary: 131 mg/dL — ABNORMAL HIGH (ref 65–99)
Glucose-Capillary: 118 mg/dL — ABNORMAL HIGH (ref 65–99)

## 2016-03-19 LAB — CBC
HEMATOCRIT: 24.7 % — AB (ref 35.0–47.0)
HEMOGLOBIN: 8.3 g/dL — AB (ref 12.0–16.0)
MCH: 26.7 pg (ref 26.0–34.0)
MCHC: 33.6 g/dL (ref 32.0–36.0)
MCV: 79.5 fL — AB (ref 80.0–100.0)
Platelets: 154 10*3/uL (ref 150–440)
RBC: 3.11 MIL/uL — ABNORMAL LOW (ref 3.80–5.20)
RDW: 16.5 % — AB (ref 11.5–14.5)
WBC: 5.5 10*3/uL (ref 3.6–11.0)

## 2016-03-19 MED ORDER — FUROSEMIDE 10 MG/ML IJ SOLN
80.0000 mg | Freq: Once | INTRAMUSCULAR | Status: AC
  Administered 2016-03-19: 80 mg via INTRAVENOUS
  Filled 2016-03-19: qty 8

## 2016-03-19 MED ORDER — FUROSEMIDE 40 MG PO TABS
40.0000 mg | ORAL_TABLET | Freq: Every day | ORAL | Status: DC
  Administered 2016-03-19: 40 mg via ORAL
  Filled 2016-03-19: qty 1

## 2016-03-19 MED ORDER — CETYLPYRIDINIUM CHLORIDE 0.05 % MT LIQD
7.0000 mL | Freq: Two times a day (BID) | OROMUCOSAL | Status: DC
  Administered 2016-03-19: 7 mL via OROMUCOSAL

## 2016-03-19 MED ORDER — POTASSIUM CHLORIDE CRYS ER 20 MEQ PO TBCR
20.0000 meq | EXTENDED_RELEASE_TABLET | Freq: Once | ORAL | Status: AC
  Administered 2016-03-19: 20 meq via ORAL
  Filled 2016-03-19: qty 1

## 2016-03-19 NOTE — Progress Notes (Signed)
PULMONARY / CRITICAL CARE MEDICINE   Name: Regina Burke MRN: [id number] DOB: Apr 17, 1933    ADMISSION DATE:  03/16/2016   PT PROFILE: Recurrent acute on chronic hypoxic and hypercarbic respiratory failure due to CHF in setting of PAF with RVR. CXR consistent with CHF  MAJOR EVENTS/TEST RESULTS: 07/08 - 07/18: hospitalized for acute on chronic respiratory failure, pulmonary edema, PAF 07/20 presented to ED with respiratory distress and pulmonary edema. Intubated in ED and admitted to ICU 07/21 AFRVR > amiodarone infusion initiated. Passed SBT and extubated 07/22 Cardiology Consultation: rebolused with amiodarone and infusion rate increased 07/22 echocardiogram: The estimated ejection fraction was in the range of 35% to 40%. Diffuse hypokinesis. The left atrium was moderately dilated. Elevated estimated PA peak pressure: 73 mm Hg (S).  INDWELLING DEVICES:: ETT 07/20 >> 07/21  MICRO DATA: Blood 07/20 >> NEG   ANTIMICROBIALS:    SUBJECTIVE:  No acute issues. No new complaints. No distress. Denies CP, fever, purulent sputum, hemoptysis, LE edema and calf tenderness   VITAL SIGNS: BP 98/71 (BP Location: Right Arm)   Pulse 99   Temp 97.7 F (36.5 C) (Axillary)   Resp 13   Ht '5\' 5"'$  (1.651 m)   Wt 87.7 kg (193 lb 5.5 oz)   SpO2 99%   BMI 32.17 kg/m   HEMODYNAMICS:    VENTILATOR SETTINGS: FiO2 (%):  [2 %] 2 %  INTAKE / OUTPUT: I/O last 3 completed shifts: In: 918 [I.V.:868; IV Piggyback:50] Out: 1725 [Urine:1725]  PHYSICAL EXAMINATION: General:  NAD Neuro: No focal deficits HEENT:  WNL Cardiovascular: Tachy, IRIR, no M Lungs: L basilar crackles, no wheezes Abdomen:  Obese, soft Extremities: warm, no edema  LABS:  BMET  Recent Labs Lab 03/17/16 0630 03/18/16 0524 03/19/16 0500  NA 142 141 140  K 4.2 4.1 3.9  CL 104 102 100*  CO2 31 32 33*  BUN 60* 56* 58*  CREATININE 2.18* 2.06* 2.15*  GLUCOSE 152* 107* 121*    Electrolytes  Recent Labs Lab  03/16/16 1419  03/16/16 1840 03/17/16 0500 03/17/16 0630 03/18/16 0524 03/19/16 0500  CALCIUM  --   < >  --   --  8.2* 8.1* 8.0*  MG 2.4  --  2.3 2.1  --   --   --   PHOS  --   --  3.6 3.0  --   --   --   < > = values in this interval not displayed.  CBC  Recent Labs Lab 03/17/16 0630 03/18/16 0524 03/19/16 0500  WBC 10.0 6.0 5.5  HGB 9.5* 9.0* 8.3*  HCT 28.4* 27.0* 24.7*  PLT 207 152 154    Coag's  Recent Labs Lab 03/16/16 1419  03/17/16 0630 03/17/16 1844 03/18/16 0524  APTT  --   < > 33 55* 75*  INR 1.76  --   --   --   --   < > = values in this interval not displayed.  Sepsis Markers  Recent Labs Lab 03/16/16 1840 03/17/16 0630 03/18/16 0524  PROCALCITON 0.13 0.22 <0.10    ABG  Recent Labs Lab 03/16/16 1404 03/17/16 0456  PHART 7.27* 7.41  PCO2ART 78* 54*  PO2ART 47* 97    Liver Enzymes  Recent Labs Lab 03/16/16 1535 03/18/16 0524  AST 57* 29  ALT 102* 69*  ALKPHOS 56 47  BILITOT 0.8 1.1  ALBUMIN 3.7 3.2*    Cardiac Enzymes  Recent Labs Lab 03/16/16 1419  TROPONINI <0.03  Glucose  Recent Labs Lab 03/18/16 1104 03/18/16 1311 03/18/16 1635 03/18/16 2152 03/19/16 0706 03/19/16 1129  GLUCAP 192* 163* 109* 178* 122* 185*    CXR: NSC CM, improved R basilar infiltrate. Persistent LLL opacity   IMPRESSION: Acute on chronic hypoxic/hypercapnic respiratory failure Pulmonary edema Hx of asthma, lung cancer s/p radiation 2015 Pulmonary hypertension - likely Group 2/3 PAF with RVR - rate control difficult Diastolic CHF Hx of CAD and NSTEMI (2016),  Hx of hypertension   AKI CKD Stage III   Anemia without acute blood loss DM 2  PLAN: Cont nocturnal BiPAP, supplemental O2  She will need long term (after discharge) for chronic hypercarbic respiratory failure Cont Breo, tiotropium initiated 07/21 Continue amiodarone infusion per Cards Scheduled metoprolol initiated 07/22 Cont low dose PRM metoprolol to maintain HR  < 115/min Cardiology consultation 07/22 noted Change SSI to ACHS Cont rivaroxaban  Will leave in SDU due to difficulty controlling HR DC planning - LCSW involved - planning SNF after discharge  Merton Border, MD PCCM service Mobile [phone] Pager [phone] 03/19/2016

## 2016-03-19 NOTE — Progress Notes (Signed)
ANTICOAGULATION CONSULT NOTE - Initial Consult  Pharmacy Consult for Rivaroxaban Indication: atrial fibrillation  Allergies  Allergen Reactions  . Ciprofloxacin Shortness Of Breath, Itching and Rash  . Doxycycline Shortness Of Breath, Itching and Rash  . Penicillins Shortness Of Breath, Itching, Rash and Other (See Comments)    Has patient had a PCN reaction causing immediate rash, facial/tongue/throat swelling, SOB or lightheadedness with hypotension: Yes Has patient had a PCN reaction causing severe rash involving mucus membranes or skin necrosis: No Has patient had a PCN reaction that required hospitalization No Has patient had a PCN reaction occurring within the last 10 years: No If all of the above answers are "NO", then may proceed with Cephalosporin use.  . Sulfa Antibiotics Shortness Of Breath, Itching and Rash  . Morphine And Related Itching  . Albuterol Sulfate   . Cefuroxime Rash    blisters   Patient Measurements: Height: '5\' 5"'$  (165.1 cm) Weight: 193 lb 5.5 oz (87.7 kg) IBW/kg (Calculated) : 57  Vital Signs: Temp: 97.7 F (36.5 C) (07/23 0800) Temp Source: Axillary (07/23 0800) BP: 98/71 (07/23 0800) Pulse Rate: 99 (07/23 0800)  Labs:  Recent Labs  03/16/16 1419  03/17/16 0520 03/17/16 0630 03/17/16 1844 03/18/16 0524 03/19/16 0500  HGB 10.8*  --   --  9.5*  --  9.0* 8.3*  HCT 33.8*  --   --  28.4*  --  27.0* 24.7*  PLT 320  --   --  207  --  152 154  APTT  --   < > 33 33 55* 75*  --   LABPROT 20.5*  --   --   --   --   --   --   INR 1.76  --   --   --   --   --   --   HEPARINUNFRC  --   --  0.40 0.35  --  0.35  --   CREATININE  --   < >  --  2.18*  --  2.06* 2.15*  TROPONINI <0.03  --   --   --   --   --   --   < > = values in this interval not displayed.  Estimated Creatinine Clearance: 22.8 mL/min (by C-G formula based on SCr of 2.15 mg/dL).   Medical History: Past Medical History:  Diagnosis Date  . Anemia   . Arthritis   . Asthma   .  Chronic diastolic CHF (congestive heart failure) ([place])    a. 10/2015 Echo: EF 55-65%, Gr1 DD, mild MR, mildly dil LA, nl RV fxn, nl PASP.  Marland Kitchen Chronic kidney disease (CKD), stage III (moderate)   . COPD (chronic obstructive pulmonary disease) ([place])   . Coronary artery disease    a. 11/2014 NSTEMI/PCI: LM nl, LAD 32m D1 30, LCX mild dzs, OM1 20p, OM2 281mOM3 90p (2.25x8 Promus Premier DES), RCA nl.   . Cough    CHRONIC AT NIGHT  . Cushing's disease ([place]  . Depression   . Diverticulitis   . Edema    FEET/LEGS  . GERD (gastroesophageal reflux disease)   . Gout   . History of hiatal hernia   . History of pneumonia   . HLD (hyperlipidemia)   . HOH (hard of hearing)   . Hypertension   . Hypertensive heart disease   . Lung cancer ([facility]x'd 2014   S/P radiation 2015  . Migraine   . Multiple allergies   . PAF (  paroxysmal atrial fibrillation) (HCC)    a. on amio, Toprol XL, Cardizem CD, and Eliquis 2.5 mg bid (age & SCr); b. CHADS2VASc ==> 7 (CHF, HTN, age x 2, DM, vascular disease and sex category)  . Sleep apnea   . Type II diabetes mellitus [facility])     Assessment: 82 yo female with acute respiratory failure. Patient has been on heparin drip inpatient, but was managed on rivaroxaban '15mg'$  daily at home for A. Fib. Pharmacy consulted for rivaroxaban restart.     Plan:  Will restart patient's home dose of rivaroxaban '15mg'$  with supper. This dose is appropriate based on patient's CrCl 15-37m/min.   TLarene Beach PharmD  Clinical Pharmacist 03/19/2016 10:38 AM

## 2016-03-19 NOTE — Progress Notes (Signed)
Pt requested central line dressing change. Old dressing noted to be clean, dry and intact with date and initials on same. Dressing changed using sterile technique.

## 2016-03-19 NOTE — Progress Notes (Signed)
Patient put on Bipap for a short time and requested for it to be removed and stated she was not wearing it tonight. Spoke with NP and told her the patient said she was not wearing it tonight, and NP stated that patient will probably need to go to rehab for oxygen needs, before she can go home, and will need to wear the Bipap at HS. Spoke with RT and asked her to come to the patient's room to explain that she needs to wear the Bipap at [facility], but patient said that since her sats are fine now, and since she wore it earlier for a while, she does not want it on now. Occasionally pt oxygen sats drops to high 80s. Now on 3L per [place]. Will continue to monitor for oxygen needs.

## 2016-03-19 NOTE — Progress Notes (Signed)
Patient Name: Regina Burke Date of Encounter: 03/19/2016  Hospital Problem List     Principal Problem:   Acute on chronic combined systolic and diastolic CHF, NYHA class 3 (HCC) Active Problems:   Acute on chronic respiratory failure with hypoxia and hypercapnia (HCC) - secondary to COPD exacerbation, now with A. fib RVR   Coronary artery disease involving native coronary artery of native heart without angina pectoris   Chronic obstructive pulmonary disease with acute exacerbation (HCC)   Chronic diastolic CHF (congestive heart failure) (HCC)   Persistent atrial fibrillation ([place]): CHA2DS2-Vasc = ~7. On Xarelto 15 mg (age & renal Fxn)   Congestive dilated cardiomyopathy (HCC)   Centrilobular emphysema (HCC)    Subjective   Still feels tired and weak. -- Very concerned about the possibility of being transferred out of the ICU/stepdown. Her hemoglobin has dropped. Overall looks better than she did yesterday. Renal function is essentially sta ble.  Inpatient Medications    . antiseptic oral rinse  7 mL Mouth Rinse BID  . fluticasone furoate-vilanterol  1 puff Inhalation Daily  . furosemide  40 mg Oral Daily  . insulin aspart  0-15 Units Subcutaneous TID WC  . insulin aspart  0-5 Units Subcutaneous QHS  . metoprolol tartrate  25 mg Oral QID  . pantoprazole  40 mg Oral Q1200  . rivaroxaban  15 mg Oral Q supper  . tiotropium  18 mcg Inhalation Daily    Vital Signs    Vitals:   03/19/16 0800 03/19/16 1000 03/19/16 1100 03/19/16 1200  BP: 98/71     Pulse: 99 (!) 42 (!) 127 (!) 111  Resp: 13 (!) 24 (!) 24 17  Temp: 97.7 F (36.5 C)   98.1 F (36.7 C)  TempSrc: Axillary   Oral  SpO2: 99% 92% 92% 100%  Weight:      Height:        Intake/Output Summary (Last 24 hours) at 03/19/16 1347 Last data filed at 03/19/16 1200  Gross per 24 hour  Intake           1096.4 ml  Output              725 ml  Net            371.4 ml   Filed Weights   03/17/16 0422 03/18/16 0500  03/19/16 0500  Weight: 191 lb 2.2 oz (86.7 kg) 193 lb 2 oz (87.6 kg) 193 lb 5.5 oz (87.7 kg)    Physical Exam    General: Pleasant, Still has increased work of breathing. Notably uncomfortable. Neuro: Alert and oriented X 3. Moves all extremities spontaneously. Psych: Normal affect. HEENT:  Normal  Neck: Supple without bruits with mild JVD vs canon a-waves from Afib. Lungs:   bibasilar crackles /rhonchi with mild increased work of breathing. No wheezing. Heart: Rapid, irregularly irregular rhythm. Unable to really determine if there are any M/R/G. Possibly split S2  Abdomen: Soft, non-tender, non-distended, BS + x 4.  Extremities: No clubbing, cyanosis with minimal edema. DP/PT/Radials 2+ and equal bilaterally.   Labs    CBC  Recent Labs  03/16/16 1419  03/18/16 0524 03/19/16 0500  WBC 13.8*  < > 6.0 5.5  NEUTROABS 11.5*  --   --   --   HGB 10.8*  < > 9.0* 8.3*  HCT 33.8*  < > 27.0* 24.7*  MCV 81.4  < > 79.6* 79.5*  PLT 320  < > 152 154  < > =  values in this interval not displayed. Basic Metabolic Panel  Recent Labs  03/16/16 1840 03/17/16 0500  03/18/16 0524 03/19/16 0500  NA  --   --   < > 141 140  K  --   --   < > 4.1 3.9  CL  --   --   < > 102 100*  CO2  --   --   < > 32 33*  GLUCOSE  --   --   < > 107* 121*  BUN  --   --   < > 56* 58*  CREATININE  --   --   < > 2.06* 2.15*  CALCIUM  --   --   < > 8.1* 8.0*  MG 2.3 2.1  --   --   --   PHOS 3.6 3.0  --   --   --   < > = values in this interval not displayed. Liver Function Tests  Recent Labs  03/16/16 1535 03/18/16 0524  AST 57* 29  ALT 102* 69*  ALKPHOS 56 47  BILITOT 0.8 1.1  PROT 6.5 5.6*  ALBUMIN 3.7 3.2*   No results for input(s): LIPASE, AMYLASE in the last 72 hours. Cardiac Enzymes  Recent Labs  03/16/16 1419  TROPONINI <0.03   BNP Invalid input(s): POCBNP D-Dimer No results for input(s): DDIMER in the last 72 hours. Hemoglobin A1C No results for input(s): HGBA1C in the last 72  hours. Fasting Lipid Panel No results for input(s): CHOL, HDL, LDLCALC, TRIG, CHOLHDL, LDLDIRECT in the last 72 hours. Thyroid Function Tests No results for input(s): TSH, T4TOTAL, T3FREE, THYROIDAB in the last 72 hours.  Invalid input(s): FREET3  Telemetry    A. fib BB, rates anywhere from high 90s to 130s  ECG    n/a  Radiology  Result Date: 03/19/2016 CLINICAL DATA:  Respiratory failure EXAM: PORTABLE CHEST 1 VIEW COMPARISON:  03/18/2016 FINDINGS: Cardiomegaly. No frank interstitial edema. Moderate left and small right pleural effusions. Associated bilateral lower lobe opacities, likely atelectasis. Stable platelike opacity in the right upper lobe, presumably radiation changes. Left IJ venous catheter terminates in the lower SVC. IMPRESSION: Moderate left and small right pleural effusions. No frank interstitial edema. Electronically Signed   By: Julian Hy M.D.   On: 03/19/2016 11:44  Dg Chest Port 1 View  Result Date: 03/18/2016 CLINICAL DATA:  Respiratory failure. EXAM: PORTABLE CHEST 1 VIEW COMPARISON:  03/17/2016 and CT chest 03/07/2016, 01/21/2016. FINDINGS: Interval extubation. Nasogastric tube is been removed as well. Left IJ central line tip projects over the SVC. Basilar dependent airspace opacification with left lower lobe collapse/ consolidation. Small right pleural effusion and moderate left pleural effusion. Oblong opacity in the apex of the right hemi thorax is seen with fiducial markers, stable. IMPRESSION: 1. Basilar dependent airspace opacification with bilateral pleural effusions. Findings may be due to congestive heart failure. 2. Left lower lobe collapse/consolidation. 3. Oblong opacity with fiducial markers in the right upper lobe, better evaluated on cross-sectional imaging 03/07/2016 and 01/21/2016. Electronically Signed   By: Lorin Picket M.D.   On: 03/18/2016 07:59    Assessment & Plan    Patient is well-known to our service and the pulmonary medicine  service. She now has again acute combined systolic and diastolic heart failure with A. fib RVR. Unfortunately now we'll see her EF is down. I am not sure what to make of the reduced ejection fraction as A. fib RVR and LBBB make it very difficult to  interpret. I'm not surprised that her EF is low with LBBB and RVR- most consistent with tachycardia-induced cardiomyopathy.. Would be better to read check once her rate is controlled. This clearly would indicate that she probably would benefit from AV nodal ablation. It may be that we need to consider this sooner than later.  Principal Problem:   Acute on chronic respiratory failure with hypoxia and hypercapnia (HCC) - secondary to COPD exacerbation, now with A. fib RVR  Will probably need some Lasix. Given IV Lasix this morning. I will order standing dose of 40 mg by mouth  Nighttime BiPAP to avoid hypercapnic failure.    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  Until EF has improved, would not go back to using diltiazem.      Persistent atrial fibrillation with RVR(HCC): CHA2DS2-Vasc = ~7. On Xarelto 15 mg (age & renal Fxn) -- Unable to adequately rate controlled despite being on amiodarone infusion. She was on 400 mg by mouth twice a day amiodarone home without adequate change.  For now continue with amiodarone infusion for control. Hopefully as the beta blocker kicks in, the rate control improved.  Unfortunately, he now looks like tachycardia induced cardiomyopathy, I suspect she may very well need AV nodal ablation in the future, however needs to be stabilized. I don't think that  attempting cardioversion again is worthwhile since she reverted right back to A. Fib. --> Need to be addressed with electrophysiology. I do not think that she will be stable enough to actually be discharged for a long time to "get stronger" in fact she continued to get weaker and now has cardiomyopathy on top of it. I think she needs a nodal ablation by the pacer.   Back on Xarelto reduced dose. Would review dosing with pharmacy, but I think this should be fine at this point. ? No bleeding issues.    Coronary artery disease involving native coronary artery of native heart without angina pectoris - stable with no anginal symptoms. She only had single-vessel disease which makes me think that the new the reduced EF is probably not related to CAD and more related to A. fib RVR and LBBB.  Was on atorvastatin and home. Make sure she is back on it when she is discharged.  On beta blocker  Not on antiplatelet agents because of intake regulation for A. fib     Centrilobular emphysema (HCC) / Chronic obstructive pulmonary disease with acute exacerbation (HCC)  Per pulmonology/PCCM --> the severity of her lung disease probably is outweighed by her cardiac issues. Concern however is that she is extremely labile and can be to repeat frequently.     Signed, Leonie Man, M.D., M.S.  [employer]  [address] [place],  [place] [address] [phone] Fax [phone]

## 2016-03-20 ENCOUNTER — Inpatient Hospital Stay: Payer: Medicare Other

## 2016-03-20 LAB — BASIC METABOLIC PANEL
Anion gap: 7 (ref 5–15)
BUN: 52 mg/dL — ABNORMAL HIGH (ref 6–20)
CO2: 34 mmol/L — ABNORMAL HIGH (ref 22–32)
Calcium: 8.4 mg/dL — ABNORMAL LOW (ref 8.9–10.3)
Chloride: 98 mmol/L — ABNORMAL LOW (ref 101–111)
Creatinine, Ser: 2.03 mg/dL — ABNORMAL HIGH (ref 0.44–1.00)
GFR calc Af Amer: 25 mL/min — ABNORMAL LOW (ref >60)
GFR calc non Af Amer: 22 mL/min — ABNORMAL LOW (ref >60)
Glucose, Bld: 129 mg/dL — ABNORMAL HIGH (ref 65–99)
Potassium: 3.4 mmol/L — ABNORMAL LOW (ref 3.5–5.1)
Sodium: 139 mmol/L (ref 135–145)

## 2016-03-20 LAB — GLUCOSE, CAPILLARY
GLUCOSE-CAPILLARY: 175 mg/dL — AB (ref 65–99)
Glucose-Capillary: 134 mg/dL — ABNORMAL HIGH (ref 65–99)
Glucose-Capillary: 144 mg/dL — ABNORMAL HIGH (ref 65–99)
Glucose-Capillary: 90 mg/dL (ref 65–99)

## 2016-03-20 MED ORDER — POTASSIUM CHLORIDE CRYS ER 20 MEQ PO TBCR
20.0000 meq | EXTENDED_RELEASE_TABLET | Freq: Once | ORAL | Status: DC
  Filled 2016-03-20: qty 1

## 2016-03-20 MED ORDER — SERTRALINE HCL 50 MG PO TABS
50.0000 mg | ORAL_TABLET | Freq: Every day | ORAL | Status: DC
  Administered 2016-03-20: 50 mg via ORAL
  Filled 2016-03-20: qty 1

## 2016-03-20 MED ORDER — FUROSEMIDE 10 MG/ML IJ SOLN
40.0000 mg | Freq: Two times a day (BID) | INTRAMUSCULAR | Status: DC
  Administered 2016-03-20 – 2016-03-21 (×3): 40 mg via INTRAVENOUS
  Filled 2016-03-20 (×3): qty 4

## 2016-03-20 MED ORDER — ALPRAZOLAM 0.25 MG PO TABS
0.2500 mg | ORAL_TABLET | Freq: Every evening | ORAL | Status: DC | PRN
  Administered 2016-03-20 – 2016-03-29 (×9): 0.25 mg via ORAL
  Filled 2016-03-20 (×9): qty 1

## 2016-03-20 MED ORDER — GUAIFENESIN ER 600 MG PO TB12
600.0000 mg | ORAL_TABLET | Freq: Two times a day (BID) | ORAL | Status: DC
  Administered 2016-03-20 – 2016-03-30 (×21): 600 mg via ORAL
  Filled 2016-03-20 (×21): qty 1

## 2016-03-20 NOTE — Progress Notes (Signed)
ANTICOAGULATION CONSULT NOTE - Initial Consult  Pharmacy Consult for Rivaroxaban Indication: atrial fibrillation  Allergies  Allergen Reactions  . Ciprofloxacin Shortness Of Breath, Itching and Rash  . Doxycycline Shortness Of Breath, Itching and Rash  . Penicillins Shortness Of Breath, Itching, Rash and Other (See Comments)    Has patient had a PCN reaction causing immediate rash, facial/tongue/throat swelling, SOB or lightheadedness with hypotension: Yes Has patient had a PCN reaction causing severe rash involving mucus membranes or skin necrosis: No Has patient had a PCN reaction that required hospitalization No Has patient had a PCN reaction occurring within the last 10 years: No If all of the above answers are "NO", then may proceed with Cephalosporin use.  . Sulfa Antibiotics Shortness Of Breath, Itching and Rash  . Morphine And Related Itching  . Albuterol Sulfate   . Cefuroxime Rash    blisters   Patient Measurements: Height: '5\' 5"'$  (165.1 cm) Weight: 193 lb 5.5 oz (87.7 kg) IBW/kg (Calculated) : 57  Vital Signs: Temp: 98.4 F (36.9 C) (07/24 0800) Temp Source: Axillary (07/24 0200) BP: 90/77 (07/24 1000) Pulse Rate: 112 (07/24 1151)  Labs:  Recent Labs  03/17/16 1844 03/18/16 0524 03/19/16 0500 03/20/16 0530  HGB  --  9.0* 8.3*  --   HCT  --  27.0* 24.7*  --   PLT  --  152 154  --   APTT 55* 75*  --   --   HEPARINUNFRC  --  0.35  --   --   CREATININE  --  2.06* 2.15* 2.03*    Estimated Creatinine Clearance: 24.2 mL/min (by C-G formula based on SCr of 2.03 mg/dL).   Medical History: Past Medical History:  Diagnosis Date  . Anemia   . Arthritis   . Asthma   . Chronic diastolic CHF (congestive heart failure) ([place])    a. 10/2015 Echo: EF 55-65%, Gr1 DD, mild MR, mildly dil LA, nl RV fxn, nl PASP.  Marland Kitchen Chronic kidney disease (CKD), stage III (moderate)   . COPD (chronic obstructive pulmonary disease) ([place])   . Coronary artery disease    a. 11/2014  NSTEMI/PCI: LM nl, LAD 80m D1 30, LCX mild dzs, OM1 20p, OM2 222mOM3 90p (2.25x8 Promus Premier DES), RCA nl.   . Cough    CHRONIC AT NIGHT  . [place]'s disease ([place]  . Depression   . Diverticulitis   . Edema    FEET/LEGS  . GERD (gastroesophageal reflux disease)   . Gout   . History of hiatal hernia   . History of pneumonia   . HLD (hyperlipidemia)   . HOH (hard of hearing)   . Hypertension   . Hypertensive heart disease   . Lung cancer (HCRiversidedx'd 2014   S/P radiation 2015  . Migraine   . Multiple allergies   . PAF (paroxysmal atrial fibrillation) (HCC)    a. on amio, Toprol XL, Cardizem CD, and Eliquis 2.5 mg bid (age & SCr); b. CHADS2VASc ==> 7 (CHF, HTN, age x 2, DM, vascular disease and sex category)  . Sleep apnea   . [facility]    Assessment: [age]o female with acute respiratory failure. Patient has been on heparin drip inpatient, but was managed on rivaroxaban '15mg'$  daily at home for A. Fib. Pharmacy consulted for rivaroxaban restart.     Plan:  Continue rivaroxaban 15 mg daily.   ScUlice DashPharmD Clinical Pharmacist  03/20/2016 12:50 PM

## 2016-03-20 NOTE — Care Management Note (Addendum)
Case Management Note  Patient Details  Name: Regina Burke MRN: [id number] Date of Birth: 30-Jun-1933  Subjective/Objective:                  Met with patient who was sitting independently in bed and son Regina Burke at bedside. Patient is readmitted related to increased weakness and difficulty breathing. She states she is from home where she was independent with daily activity- Regina Burke asked about pacemaker being placed this visit. She has obstructive sleep apnea on her diagnosis. Patient states that she has a CPAP at home "that doesn't work and it's over 82 years old". She states she completed another sleep study 6-7 months ago and "chose not to get another CPAP because I don't like it". She is on chronic O2 at home through [facility]. Patient wants to go to SNF at discharge. She is open to [facility] and would be a good candidate for [place] if able to return home- she must maintain compliance with treatment through.   Action/Plan:   I explained that she must use CPAP to prove that it is not working to meet her needs and therefore attempt authorization for home BiPAP. OSA is usually a barrier for home BiPAP. She is agreeing to wear CPAP at this time but seemed to get agitated as I stressed the need. She was again offered the option of paying/renting a home BiPAP and she repeated price given last admission. RNCM can attempt to obtain a home BIPAP for patient however these barriers are in place: [facility] authorizing; OSA diagnosis; patient non-compliance. I discussed all these with patient and son Regina Burke. [facility] aware of patient admission.   Expected Discharge Date:                  Expected Discharge Plan:     In-House Referral:  Clinical Social Work  Discharge planning Services  CM Consult  Post Acute Care Choice:  Home Health, Durable Medical Equipment Choice offered to:  Patient, Adult Children  DME Arranged:    DME Agency:     HH Arranged:    [place] Agency:  [facility]  Status of Service:  In process, will continue to follow  If discussed at Long Length of Stay Meetings, dates discussed:    Additional Comments:  Marshell Garfinkel, RN 03/20/2016, 12:07 PM

## 2016-03-20 NOTE — Progress Notes (Signed)
Patient wants to know if she can restart her xanax and Zoloft for sleep if she is not going to be using the Bipap at night. Will pass on to day shift nurse to discuss with MD today.

## 2016-03-20 NOTE — Progress Notes (Signed)
PULMONARY / CRITICAL CARE MEDICINE   Name: Regina Burke MRN: [id number] DOB: Dec 06, 1932    ADMISSION DATE:  03/16/2016   PT PROFILE: Recurrent acute on chronic hypoxic and hypercarbic respiratory failure due to CHF in setting of PAF with RVR. CXR consistent with CHF  Review of Systems: Positives in BOLD Gen: Denies fever, chills, weight change, fatigue, night sweats HEENT: Denies blurred vision, double vision, hearing loss, tinnitus, sinus congestion, rhinorrhea, sore throat, neck stiffness, dysphagia PULM: shortness of breath, cough, sputum production, hemoptysis, wheezing CV: Denies chest pain, edema, orthopnea, paroxysmal nocturnal dyspnea, palpitations GI: Denies abdominal pain, nausea, vomiting, diarrhea, hematochezia, melena, constipation, change in bowel habits GU: Denies dysuria, hematuria, polyuria, oliguria, urethral discharge Endocrine: Denies hot or cold intolerance, polyuria, polyphagia or appetite change Derm: Denies rash, dry skin, scaling or peeling skin change Heme: Denies easy bruising, bleeding, bleeding gums Neuro: Denies headache, numbness, weakness, slurred speech, loss of memory or consciousness    MAJOR EVENTS/TEST RESULTS: 07/08 - 07/18: hospitalized for acute on chronic respiratory failure, pulmonary edema, PAF 07/20 presented to ED with respiratory distress and pulmonary edema. Intubated in ED and admitted to ICU 07/21 AFRVR > amiodarone infusion initiated. Passed SBT and extubated 07/22 Cardiology Consultation: rebolused with amiodarone and infusion rate increased 07/22 echocardiogram: The estimated ejection fraction was in the range of 35% to 40%. Diffuse hypokinesis. The left atrium was moderately dilated. Elevated estimated PA peak pressure: 73 mm Hg (S).  INDWELLING DEVICES:: ETT 07/20 >> 07/21  MICRO DATA: Blood 07/20 >> NEG   ANTIMICROBIALS:    SUBJECTIVE:  No acute issues overnight tolerating 3L O2 via nasal canula   VITAL SIGNS: BP  103/69   Pulse (!) 102   Temp 97.6 F (36.4 C) (Axillary)   Resp 20   Ht '5\' 5"'$  (1.651 m)   Wt 193 lb 5.5 oz (87.7 kg)   SpO2 99%   BMI 32.17 kg/m   HEMODYNAMICS:    VENTILATOR SETTINGS:    INTAKE / OUTPUT: I/O last 3 completed shifts: In: 2065.5 [P.O.:900; I.V.:1165.5] Out: 700 [Urine:700]  PHYSICAL EXAMINATION: General:  NAD Neuro: No focal deficits HEENT: supple, no JVD Cardiovascular: irregular irregular, no M/R/G Lungs: diminished throughout, even, non labored no wheezes Abdomen: +BS x4, obese, soft, non tender Extremities: warm, no edema  LABS:  BMET  Recent Labs Lab 03/18/16 0524 03/19/16 0500 03/20/16 0530  NA 141 140 139  K 4.1 3.9 3.4*  CL 102 100* 98*  CO2 32 33* 34*  BUN 56* 58* 52*  CREATININE 2.06* 2.15* 2.03*  GLUCOSE 107* 121* 129*    Electrolytes  Recent Labs Lab 03/16/16 1419  03/16/16 1840 03/17/16 0500  03/18/16 0524 03/19/16 0500 03/20/16 0530  CALCIUM  --   < >  --   --   < > 8.1* 8.0* 8.4*  MG 2.4  --  2.3 2.1  --   --   --   --   PHOS  --   --  3.6 3.0  --   --   --   --   < > = values in this interval not displayed.  CBC  Recent Labs Lab 03/17/16 0630 03/18/16 0524 03/19/16 0500  WBC 10.0 6.0 5.5  HGB 9.5* 9.0* 8.3*  HCT 28.4* 27.0* 24.7*  PLT 207 152 154    Coag's  Recent Labs Lab 03/16/16 1419  03/17/16 0630 03/17/16 1844 03/18/16 0524  APTT  --   < > 33 55* 75*  INR 1.76  --   --   --   --   < > =  values in this interval not displayed.  Sepsis Markers  Recent Labs Lab 03/16/16 1840 03/17/16 0630 03/18/16 0524  PROCALCITON 0.13 0.22 <0.10    ABG  Recent Labs Lab 03/16/16 1404 03/17/16 0456  PHART 7.27* 7.41  PCO2ART 78* 54*  PO2ART 47* 97    Liver Enzymes  Recent Labs Lab 03/16/16 1535 03/18/16 0524  AST 57* 29  ALT 102* 69*  ALKPHOS 56 47  BILITOT 0.8 1.1  ALBUMIN 3.7 3.2*    Cardiac Enzymes  Recent Labs Lab 03/16/16 1419  TROPONINI <0.03    Glucose  Recent  Labs Lab 03/18/16 2152 03/19/16 0706 03/19/16 1129 03/19/16 1643 03/19/16 2136 03/20/16 0734  GLUCAP 178* 122* 185* 131* 118* 134*    CXR: NSC CM, improved R basilar infiltrate. Persistent LLL opacity   IMPRESSION: Acute on chronic hypoxic/hypercapnic respiratory failure Pulmonary edema Hx of asthma, lung cancer s/p radiation 2015 Pulmonary hypertension - likely Group 2/3 PAF with RVR - rate control difficult Diastolic CHF Hx of CAD and NSTEMI (2016),  Hx of hypertension   AKI CKD Stage III   Anemia without acute blood loss DM 2  PLAN: Cont nocturnal BiPAP, supplemental O2  She will need long term (after discharge) for Chronic hypercarbic respiratory failure Cont Breo, tiotropium initiated 07/21 Continue amiodarone infusion per Cards Scheduled metoprolol initiated 07/22 Cont low dose PRM metoprolol to maintain HR < 115/min Cardiology consultation 07/22 noted SSI ACHS Cont rivaroxaban  DC planning - LCSW involved - planning SNF after discharge Transfer to telemetry 03/20/16  Marda Stalker, [employer] Pager [phone] (please enter 7 digits) PCCM Consult Pager [phone] (please enter 7 digits)  Pt seen and examined, agree with NP, pulm edema with acute on chronic resp failure, now doing better. OK to transfer to floor.  Continue rixaroxaban, bipap qhs.   Marda Stalker, M.D.  03/20/2016

## 2016-03-20 NOTE — Progress Notes (Signed)
Nutrition Follow-up  DOCUMENTATION CODES:   Obesity unspecified  INTERVENTION:  -Monitor intake and cater to pt preferences    NUTRITION DIAGNOSIS:   Inadequate oral intake related to acute illness as evidenced by NPO status.  Improved as on solid food diet  GOAL:   Patient will meet greater than or equal to 90% of their needs  Meeting goals  MONITOR:   Diet advancement, PO intake, Labs, Weight trends  REASON FOR ASSESSMENT:   Consult Enteral/tube feeding initiation and management  ASSESSMENT:    82 yo female admitted with acute on chronic respiratory failure with pulmonary edema requiring intubation on 7/20, extubated 7/21. Pt with hx of asthma, CHF, lung cancer s/p radiation in 2015. Pt with recent admission from 7/8 to 7/18 with CHF exacerbation and afib with RVR.   Pt extubated.  Pt reports ate cereal this am for breakfast and tolerated well.  Overall intake is fair this admission.  Pt reports for few days prior to admission poor po intake secondary to nausea, vomiting otherwise healthy appetite.   Medications reviewed:   Labs reviewed:  K 3.4, BUN 52, creatinine 2.03   Diet Order:  Diet heart healthy/carb modified Room service appropriate?: Yes; Fluid consistency:: Thin  Skin:  Reviewed, no issues  Last BM:  7/23  Height:   Ht Readings from Last 1 Encounters:  03/16/16 '5\' 5"'$  (1.651 m)    Weight:   Wt Readings from Last 1 Encounters:  03/20/16 193 lb 5.5 oz (87.7 kg)    Ideal Body Weight:     BMI:  Body mass index is 32.17 kg/m.  Estimated Nutritional Needs:   Kcal:  3016-0109 kcals   Protein:  85-101 g  Fluid:  >/= 1.7 L  EDUCATION NEEDS:   No education needs identified at this time  Heinz Eckert B. Zenia Resides, [place], [place] (pager) Weekend/On-Call pager [phone])

## 2016-03-20 NOTE — Progress Notes (Signed)
Patient admitted with CHF exacerbation- Intubated, extubated and transferred to floor. Cardiology following Will need Bipap at discharge and also SNF.  Patient being transferred to tele floor. Hospitalist accepted transfer

## 2016-03-20 NOTE — Progress Notes (Signed)
Physical Therapy Treatment Patient Details Name: Regina Burke MRN: [id number] DOB: [birth year] Today's Date: 03/20/2016    History of Present Illness 82 yo female with a PMH of Type II diabetes, anemia, GERD, cushing's disease, asthma, CKD stage III, pneumonia, edema, paroxysmal atrial fibrillation, HTN, lung cancer s/p radiation 2015, CAD, NSTEMI, HTN, diverticulitis, and gout. She presented to [facility] ER via EMS on 7/20 with c/o worsening shortness of breath. She was recently admitted to the hospital on 03/04/16 and discharged 03/14/16 for CHF exacerbation and Afibb/RVR and was discharged on her normal supplemental oxygen of 3L.     PT Comments    Pt up in chair and agreeable to PT, but only exercises in the chair due to the heat. Pt sitting with ice pack and fans blowing on her. Pt does participate in long sit exercises well with rest as needed to manage shortness of breath/perceived exertion. Pt's O2 saturation remains 96*98% throughout and heart rate between 113 and 125 beats per minute. Pt has questions regarding difficulty she experienced at home with mobility and fatigue. Discussion/education on energy conservation concepts and benefits of rest between exertion to manage exertion/undo stress to body and how to incorporate with exercise and ambulation. Pt understands. Continue PT to progress strength, endurance and safe activity concepts to improve functional mobility.   Follow Up Recommendations  SNF     Equipment Recommendations       Recommendations for Other Services       Precautions / Restrictions Precautions Precautions: Fall Restrictions Weight Bearing Restrictions: No    Mobility  Bed Mobility               General bed mobility comments: Not tested; pt up in chair  Transfers                 General transfer comment: Not tested; pt refuses out of chair due to heat/no AC; Pt with ice pack, and fan   Ambulation/Gait                 Stairs             Wheelchair Mobility    Modified Rankin (Stroke Patients Only)       Balance                                    Cognition Arousal/Alertness: Awake/alert Behavior During Therapy: WFL for tasks assessed/performed Overall Cognitive Status: Within Functional Limits for tasks assessed                      Exercises General Exercises - Lower Extremity Ankle Circles/Pumps: AROM;Both;20 reps Quad Sets: Strengthening;Both;10 reps Gluteal Sets: Strengthening;Both;10 reps Short Arc Quad: AROM;Both;10 reps Heel Slides: AROM;Both;10 reps Hip ABduction/ADduction: AROM;Both;10 reps Straight Leg Raises: AROM;Both;10 reps    General Comments        Pertinent Vitals/Pain Pain Assessment: No/denies pain    Home Living                      Prior Function            PT Goals (current goals can now be found in the care plan section) Progress towards PT goals: Progressing toward goals    Frequency  Min 2X/week    PT Plan Current plan remains appropriate    Co-evaluation  End of Session Equipment Utilized During Treatment: Oxygen Activity Tolerance: Patient limited by fatigue (limited by heat) Patient left: with call bell/phone within reach;with chair alarm set;in chair     Time: 1448-1856 PT Time Calculation (min) (ACUTE ONLY): 25 min  Charges:  $Therapeutic Exercise: 23-37 mins                    G Codes:      Charlaine Dalton, PTA 03/20/2016, 12:50 PM

## 2016-03-20 NOTE — Progress Notes (Signed)
Patient Name: Regina Burke Date of Encounter: 03/20/2016  Hospital Problem List     Principal Problem:   Acute on chronic combined systolic and diastolic CHF, NYHA class 3 (HCC) Active Problems:   Acute on chronic respiratory failure with hypoxia and hypercapnia (HCC) - secondary to COPD exacerbation, now with A. fib RVR   Coronary artery disease involving native coronary artery of native heart without angina pectoris   Centrilobular emphysema (HCC)   Chronic obstructive pulmonary disease with acute exacerbation (HCC)   Chronic diastolic CHF (congestive heart failure) (HCC)   Persistent atrial fibrillation ([place]): CHA2DS2-Vasc = ~7. On Xarelto 15 mg (age & renal Fxn)   Congestive dilated cardiomyopathy ([place])    Subjective   She is still having significant dyspnea. She did not sleep well overnight because she did not get "Xanax". She continues to be in atrial fibrillation with rapid ventricular response.  Inpatient Medications    . fluticasone furoate-vilanterol  1 puff Inhalation Daily  . furosemide  40 mg Intravenous Q12H  . insulin aspart  0-15 Units Subcutaneous TID WC  . insulin aspart  0-5 Units Subcutaneous QHS  . metoprolol tartrate  25 mg Oral QID  . pantoprazole  40 mg Oral Q1200  . rivaroxaban  15 mg Oral Q supper  . tiotropium  18 mcg Inhalation Daily    Vital Signs    Vitals:   03/20/16 0448 03/20/16 0500 03/20/16 0600 03/20/16 0700  BP:  103/69    Pulse:  99 (!) 104 (!) 102  Resp:  '17 17 20  '$ Temp:      TempSrc:      SpO2:  100% 99% 99%  Weight: 193 lb 5.5 oz (87.7 kg)     Height:        Intake/Output Summary (Last 24 hours) at 03/20/16 0815 Last data filed at 03/20/16 0600  Gross per 24 hour  Intake           1632.6 ml  Output              500 ml  Net           1132.6 ml   Filed Weights   03/18/16 0500 03/19/16 0500 03/20/16 0448  Weight: 193 lb 2 oz (87.6 kg) 193 lb 5.5 oz (87.7 kg) 193 lb 5.5 oz (87.7 kg)    Physical Exam    General:  Pleasant, Still has increased work of breathing. Notably uncomfortable. Neuro: Alert and oriented X 3. Moves all extremities spontaneously. Psych: Normal affect. HEENT:  Normal  Neck: Supple without bruits with mild JVD . Lungs:   bibasilar crackles /rhonchi with mild increased work of breathing. No wheezing. Heart: Rapid, irregularly irregular rhythm. No murmurs appreciated. Morning  Abdomen: Soft, non-tender, non-distended, BS + x 4.  Extremities: No clubbing, cyanosis with minimal edema. DP/PT/Radials 2+ and equal bilaterally.   Labs    CBC  Recent Labs  03/18/16 0524 03/19/16 0500  WBC 6.0 5.5  HGB 9.0* 8.3*  HCT 27.0* 24.7*  MCV 79.6* 79.5*  PLT 152 662   Basic Metabolic Panel  Recent Labs  03/19/16 0500 03/20/16 0530  NA 140 139  K 3.9 3.4*  CL 100* 98*  CO2 33* 34*  GLUCOSE 121* 129*  BUN 58* 52*  CREATININE 2.15* 2.03*  CALCIUM 8.0* 8.4*   Liver Function Tests  Recent Labs  03/18/16 0524  AST 29  ALT 69*  ALKPHOS 47  BILITOT 1.1  PROT 5.6*  ALBUMIN  3.2*   No results for input(s): LIPASE, AMYLASE in the last 72 hours. Cardiac Enzymes No results for input(s): CKTOTAL, CKMB, CKMBINDEX, TROPONINI in the last 72 hours. BNP Invalid input(s): POCBNP D-Dimer No results for input(s): DDIMER in the last 72 hours. Hemoglobin A1C No results for input(s): HGBA1C in the last 72 hours. Fasting Lipid Panel No results for input(s): CHOL, HDL, LDLCALC, TRIG, CHOLHDL, LDLDIRECT in the last 72 hours. Thyroid Function Tests No results for input(s): TSH, T4TOTAL, T3FREE, THYROIDAB in the last 72 hours.  Invalid input(s): FREET3  Telemetry    A. fib BB, rates anywhere from high 90s to 130s  ECG    n/a  Radiology  Result Date: 03/19/2016 CLINICAL DATA:  Respiratory failure EXAM: PORTABLE CHEST 1 VIEW COMPARISON:  03/18/2016 FINDINGS: Cardiomegaly. No frank interstitial edema. Moderate left and small right pleural effusions. Associated bilateral lower lobe  opacities, likely atelectasis. Stable platelike opacity in the right upper lobe, presumably radiation changes. Left IJ venous catheter terminates in the lower SVC. IMPRESSION: Moderate left and small right pleural effusions. No frank interstitial edema. Electronically Signed   By: Julian Hy M.D.   On: 03/19/2016 11:44  Dg Chest Port 1 View  Result Date: 03/18/2016 CLINICAL DATA:  Respiratory failure. EXAM: PORTABLE CHEST 1 VIEW COMPARISON:  03/17/2016 and CT chest 03/07/2016, 01/21/2016. FINDINGS: Interval extubation. Nasogastric tube is been removed as well. Left IJ central line tip projects over the SVC. Basilar dependent airspace opacification with left lower lobe collapse/ consolidation. Small right pleural effusion and moderate left pleural effusion. Oblong opacity in the apex of the right hemi thorax is seen with fiducial markers, stable. IMPRESSION: 1. Basilar dependent airspace opacification with bilateral pleural effusions. Findings may be due to congestive heart failure. 2. Left lower lobe collapse/consolidation. 3. Oblong opacity with fiducial markers in the right upper lobe, better evaluated on cross-sectional imaging 03/07/2016 and 01/21/2016. Electronically Signed   By: Lorin Picket M.D.   On: 03/18/2016 07:59    Assessment & Plan    Patient is well-known to our service and the pulmonary medicine service. She now has again acute combined systolic and diastolic heart failure with A. fib RVR. Unfortunately now we'll see her EF is down which might be tachycardia induced.   1.  Acute on chronic respiratory failure with hypoxia and hypercapnia (HCC) - secondary to COPD exacerbation and acute on chronic heart failure. Continue diuresis. Nighttime BiPAP to avoid hypercapnic failure.  2. Acute on chronic systolic and diastolic heart failure: I reviewed her chest x-ray which showed small pleural effusion with pulmonary vascular congestion. Echocardiogram showed a drop in LV systolic  function with an EF of 35-40% . The drop in EF is likely due to tachycardia induced cardiomyopathy. Pulmonary pressure was 73 I switched mmHg and the patient appears to be volume overloaded.  I switched furosemide 40 mg IV twice daily.  3. Chronic atrial fibrillation with rapid ventricular response: Unfortunately, we have not been able to control her rate. She underwent cardioversion during last admission while she was on IV amiodarone and in spite of that she did not maintain sinus rhythm. Thus, the chance of keeping her in sinus rhythm is minimal. Atrial fibrillation with rapid ventricular response is having detrimental effect on her cardiac condition and has led to deterioration in LV systolic function. I left a message to Dr. Caryl Comes to discuss the possibility of proceeding with AV nodal ablation and permanent pacemaker placement during this admission. That will require transferr to [facility].  Continue anticoagulation with Xarelto.     4. Coronary artery disease involving native coronary arteries without angina: Continue medical therapy.     Signed,  Kathlyn Sacramento, MD

## 2016-03-20 NOTE — Progress Notes (Signed)
Transferred to 243 via bed with O2 and cardiac mo nitor on. Up to bathroom with help. Report called earlier to [facility]

## 2016-03-20 NOTE — Progress Notes (Signed)
Pt placed back on Bipap but was up most of the night, saying she had a bad night. Up in recliner this AM with [place] at 3L. Continue to monitor.

## 2016-03-20 NOTE — Progress Notes (Signed)
Spoke with Dr. Tressia Miners will transfer service to hospitalist on 03/21/2016  Marda Stalker, [place] Pager [phone] (please enter 7 digits) [place] Pager [phone] (please enter 7 digits)

## 2016-03-21 DIAGNOSIS — J9602 Acute respiratory failure with hypercapnia: Secondary | ICD-10-CM

## 2016-03-21 LAB — CULTURE, BLOOD (ROUTINE X 2)
CULTURE: NO GROWTH
CULTURE: NO GROWTH

## 2016-03-21 LAB — BASIC METABOLIC PANEL
ANION GAP: 10 (ref 5–15)
BUN: 51 mg/dL — ABNORMAL HIGH (ref 6–20)
CALCIUM: 8.2 mg/dL — AB (ref 8.9–10.3)
CO2: 33 mmol/L — AB (ref 22–32)
CREATININE: 2.4 mg/dL — AB (ref 0.44–1.00)
Chloride: 96 mmol/L — ABNORMAL LOW (ref 101–111)
GFR, EST AFRICAN AMERICAN: 21 mL/min — AB (ref >60)
GFR, EST NON AFRICAN AMERICAN: 18 mL/min — AB (ref >60)
Glucose, Bld: 140 mg/dL — ABNORMAL HIGH (ref 65–99)
Potassium: 3.3 mmol/L — ABNORMAL LOW (ref 3.5–5.1)
SODIUM: 139 mmol/L (ref 135–145)

## 2016-03-21 LAB — CBC WITH DIFFERENTIAL/PLATELET
BASOS ABS: 0 10*3/uL (ref 0–0.1)
BASOS PCT: 1 %
EOS ABS: 0.1 10*3/uL (ref 0–0.7)
Eosinophils Relative: 3 %
HCT: 24.8 % — ABNORMAL LOW (ref 35.0–47.0)
HEMOGLOBIN: 8.3 g/dL — AB (ref 12.0–16.0)
Lymphocytes Relative: 12 %
Lymphs Abs: 0.7 10*3/uL — ABNORMAL LOW (ref 1.0–3.6)
MCH: 26.8 pg (ref 26.0–34.0)
MCHC: 33.3 g/dL (ref 32.0–36.0)
MCV: 80.3 fL (ref 80.0–100.0)
MONOS PCT: 6 %
Monocytes Absolute: 0.3 10*3/uL (ref 0.2–0.9)
NEUTROS PCT: 78 %
Neutro Abs: 4.4 10*3/uL (ref 1.4–6.5)
Platelets: 140 10*3/uL — ABNORMAL LOW (ref 150–440)
RBC: 3.09 MIL/uL — ABNORMAL LOW (ref 3.80–5.20)
RDW: 17.3 % — AB (ref 11.5–14.5)
WBC: 5.6 10*3/uL (ref 3.6–11.0)

## 2016-03-21 LAB — GLUCOSE, CAPILLARY
GLUCOSE-CAPILLARY: 136 mg/dL — AB (ref 65–99)
Glucose-Capillary: 184 mg/dL — ABNORMAL HIGH (ref 65–99)
Glucose-Capillary: 134 mg/dL — ABNORMAL HIGH (ref 65–99)
Glucose-Capillary: 152 mg/dL — ABNORMAL HIGH (ref 65–99)

## 2016-03-21 MED ORDER — FERROUS SULFATE 325 (65 FE) MG PO TABS
325.0000 mg | ORAL_TABLET | Freq: Every day | ORAL | Status: DC
  Administered 2016-03-21 – 2016-03-30 (×10): 325 mg via ORAL
  Filled 2016-03-21 (×10): qty 1

## 2016-03-21 MED ORDER — SERTRALINE HCL 50 MG PO TABS
50.0000 mg | ORAL_TABLET | Freq: Every day | ORAL | Status: DC
  Administered 2016-03-21 – 2016-03-30 (×10): 50 mg via ORAL
  Filled 2016-03-21 (×10): qty 1

## 2016-03-21 MED ORDER — GUAIFENESIN 100 MG/5ML PO SOLN
10.0000 mL | ORAL | Status: DC | PRN
  Administered 2016-03-21: 200 mg via ORAL
  Filled 2016-03-21: qty 10

## 2016-03-21 NOTE — Progress Notes (Signed)
* [facility]     Assessment and Plan:  Acute on chronic hypercapnic respiratory failure -Sleep Study 06/2015 positive for OSA, required CPAP '@15'$ .  -Pt appears intolerant of CPAP, will try empircally tonight, but recommend that patient she be switched to bipap outpatient for further treatment of OSA, hypercapnia, Atrial fibrillation.   Afib with RVR -May be contributed to by OSA.   Hx of asthma, lung cancer s/p radiation 2015  Pulmonary hypertension - likely Group 2/3   Date: 03/21/2016  MRN# [id number] Regina Burke February 09, 1933   Regina Burke is a 82 y.o. old female seen in follow up for chief complaint of  Chief Complaint  Patient presents with  . Shortness of Breath     HPI:   No new complaints today, used bipap overnight, but overall had rough night.    Allergies:  Ciprofloxacin; Doxycycline; Penicillins; Sulfa antibiotics; Morphine and related; Albuterol sulfate; and Cefuroxime  Review of Systems: Gen:  Denies  fever, sweats. HEENT: Denies blurred vision. Cvc:  No dizziness, chest pain or heaviness Resp:   Denies cough or sputum porduction. Gi: Denies swallowing difficulty, stomach pain. constipation, bowel incontinence Gu:  Denies bladder incontinence, burning urine Ext:   No Joint pain, stiffness. Skin: No skin rash, easy bruising. Endoc:  No polyuria, polydipsia. Psych: No depression, insomnia. Other:  All other systems were reviewed and found to be negative other than what is mentioned in the HPI.   Physical Examination:   VS: BP 109/64 (BP Location: Left Arm)   Pulse (!) 104   Temp 98.1 F (36.7 C) (Oral)   Resp 19   Ht '5\' 5"'$  (1.651 m)   Wt 190 lb 1.6 oz (86.2 kg)   SpO2 95%   BMI 31.63 kg/m   General Appearance: No distress  Neuro:without focal findings,  speech normal,  HEENT: PERRLA, EOM intact. Pulmonary: normal breath sounds, No wheezing.   CardiovascularNormal S1,S2.  No m/r/g.   Abdomen: Benign, Soft,  non-tender. Renal:  No costovertebral tenderness  GU:  Not performed at this time. Endoc: No evident thyromegaly, no signs of acromegaly. Skin:   warm, no rash. Extremities: normal, no cyanosis, clubbing.   LABORATORY PANEL:   CBC  Recent Labs Lab 03/21/16 0508  WBC 5.6  HGB 8.3*  HCT 24.8*  PLT 140*   ------------------------------------------------------------------------------------------------------------------  Chemistries   Recent Labs Lab 03/17/16 0500  03/18/16 0524  03/21/16 0500  NA  --   < > 141  < > 139  K  --   < > 4.1  < > 3.3*  CL  --   < > 102  < > 96*  CO2  --   < > 32  < > 33*  GLUCOSE  --   < > 107*  < > 140*  BUN  --   < > 56*  < > 51*  CREATININE  --   < > 2.06*  < > 2.40*  CALCIUM  --   < > 8.1*  < > 8.2*  MG 2.1  --   --   --   --   AST  --   --  29  --   --   ALT  --   --  69*  --   --   ALKPHOS  --   --  47  --   --   BILITOT  --   --  1.1  --   --   < > =  values in this interval not displayed. ------------------------------------------------------------------------------------------------------------------  Cardiac Enzymes  Recent Labs Lab 03/16/16 1419  TROPONINI <0.03   ------------------------------------------------------------  RADIOLOGY:   No results found for this or any previous visit. Results for orders placed during the hospital encounter of 01/19/16  DG Chest 2 View   Narrative CLINICAL DATA:  Dyspnea, hypoxia  EXAM: CHEST  2 VIEW  COMPARISON:  11/24/2015 and 01/12/2016  FINDINGS: Cardiomediastinal silhouette is stable. Again noted scarring and surgical clips in right upper lobe medially. No acute infiltrate or pleural effusion. No pulmonary edema. Osteopenia and mild degenerative changes thoracic spine. Hyperinflation again noted.  IMPRESSION: No active disease. Hyperinflation again noted. Stable scarring and surgical clips in right upper lobe medially. Osteopenia and mild degenerative changes thoracic  spine.   Electronically Signed   By: Lahoma Crocker M.D.   On: 01/19/2016 16:28    ------------------------------------------------------------------------------------------------------------------  Thank  you for allowing [facility] to assist in the care of your patient. Our recommendations are noted above.  Please contact us if we can be of further service.   Marda Stalker, MD.  [facility] Office Number: [phone]  Patricia Pesa, M.D.  Vilinda Boehringer, M.D.  Merton Border, M.D  03/21/2016

## 2016-03-21 NOTE — Progress Notes (Signed)
Patient had an initial appointment scheduled at the [facility] on 03/22/16 but since she is still in the hospital, have rescheduled it to April 06, 2016 at 11:00am. Thank you.

## 2016-03-21 NOTE — Consult Note (Signed)
[name]  [phone]) [phone] [phone]) [phone]  ID: Regina Burke OB: 1932-12-03  MR#: [id number]  YIF#:[id number]  Patient Care Team: Regina Harrier, MD as PCP - General (Internal Medicine) Regina Merritts, MD as Consulting Physician (Cardiology)  CHIEF COMPLAINT: History of stage I lung cancer, worsening anemia.  INTERVAL HISTORY: This is an 82 year old female with a history of stage I lung cancer status post XRT. She is also known to have a baseline anemia that has become worse during her hospitalization for acute respiratory failure. Currently, patient is sitting at bedside eating dinner. She continues to be short of breath but otherwise feels well. She does admit to increased weakness and fatigue. She has no neurologic complaints. She denies any recent fevers. She denies any chest pain, cough, or hemoptysis. She denies any nausea, vomiting, constipation, or diarrhea. She has no melena or hematochezia. She has no urinary complaints. Patient otherwise feels well and offers no further specific complaints.  REVIEW OF SYSTEMS:   Review of Systems  Constitutional: Positive for malaise/fatigue. Negative for fever and weight loss.  Respiratory: Positive for shortness of breath. Negative for cough.   Cardiovascular: Negative.  Negative for chest pain.  Gastrointestinal: Negative.  Negative for blood in stool and melena.  Genitourinary: Negative.   Musculoskeletal: Negative.   Neurological: Positive for weakness.  Psychiatric/Behavioral: Negative.     As per HPI. Otherwise, a complete review of systems is negatve.  PAST MEDICAL HISTORY: Past Medical History:  Diagnosis Date  . Anemia   . Arthritis   . Asthma   . Chronic diastolic CHF (congestive heart failure) ([place])    a. 10/2015 Echo: EF 55-65%, Gr1 DD, mild MR, mildly dil LA, nl RV fxn, nl PASP.  Marland Kitchen Chronic kidney disease (CKD), stage III (moderate)   . COPD (chronic obstructive pulmonary disease)  ([place])   . Coronary artery disease    a. 11/2014 NSTEMI/PCI: LM nl, LAD 74m D1 30, LCX mild dzs, OM1 20p, OM2 244mOM3 90p (2.25x8 Promus Premier DES), RCA nl.   . Cough    CHRONIC AT NIGHT  . Cushing's disease ([facility]  . Depression   . Diverticulitis   . Edema    FEET/LEGS  . GERD (gastroesophageal reflux disease)   . Gout   . History of hiatal hernia   . History of pneumonia   . HLD (hyperlipidemia)   . HOH (hard of hearing)   . Hypertension   . Hypertensive heart disease   . Lung cancer [place] 2014   S/P radiation 2015  . Migraine   . Multiple allergies   . PAF (paroxysmal atrial fibrillation) ([facility])    a. on amio, Toprol XL, Cardizem CD, and Eliquis 2.5 mg bid (age & SCr); b. CHADS2VASc ==> 7 (CHF, HTN, age x 2, DM, vascular disease and sex category)  . Sleep apnea   . Type II diabetes mellitus ([facility]    PAST SURGICAL HISTORY: Past Surgical History:  Procedure Laterality Date  . ABDOMINAL HYSTERECTOMY    . ADRENALECTOMY Left 1980's   "[place]"  . APPENDECTOMY    . BREAST CYST EXCISION Left   . CATARACT EXTRACTION W/PHACO Right 12/28/2015   Procedure: CATARACT EXTRACTION PHACO AND INTRAOCULAR LENS PLACEMENT (IOC);  Surgeon: WiBirder RobsonMD;  Location: [facility] ORS;  Service: Ophthalmology;  Laterality: Right;  [place]   . CHOLECYSTECTOMY    . CORONARY ANGIOPLASTY WITH STENT PLACEMENT  12/23/2014  . ELECTROPHYSIOLOGIC STUDY N/A 03/08/2016   Procedure:  CARDIOVERSION;  Surgeon: Regina Bushy, MD;  Location: [facility] ORS;  Service: Cardiovascular;  Laterality: N/A;  . ELECTROPHYSIOLOGIC STUDY N/A 03/07/2016   Procedure: Cardioversion;  Surgeon: Regina Bushy, MD;  Location: [facility] ORS;  Service: Cardiovascular;  Laterality: N/A;  . FRACTURE SURGERY    . LEFT HEART CATHETERIZATION WITH CORONARY ANGIOGRAM N/A 12/23/2014   Procedure: LEFT HEART CATHETERIZATION WITH CORONARY ANGIOGRAM;  Surgeon: Regina Blanks, MD;  Location: [facility] CATH LAB;  Service: Cardiovascular;  Laterality:  N/A;  . PERCUTANEOUS CORONARY STENT INTERVENTION (PCI-S)  12/23/2014   Procedure: PERCUTANEOUS CORONARY STENT INTERVENTION (PCI-S);  Surgeon: Regina Blanks, MD;  Location: [facility] CATH LAB;  Service: Cardiovascular;;  Promus 2.25x8  . TONSILLECTOMY    . TRANSTHORACIC ECHOCARDIOGRAM  11/26/2015   Technically difficult study. EF 55-60%. Normal wall motion. GR 1 DD.  . TUBAL LIGATION    . WRIST FRACTURE SURGERY Bilateral ~ 2000    FAMILY HISTORY: Family History  Problem Relation Age of Onset  . Heart disease Mother   . Diabetes Mother   . Osteoarthritis Mother   . Hypertension Mother   . Heart disease Father   . Hypertension Father   . COPD Brother        ADVANCED DIRECTIVES:    HEALTH MAINTENANCE: Social History  Substance Use Topics  . Smoking status: Former Smoker    Packs/day: 1.00    Years: 45.00    Types: Cigarettes    Quit date: 04/25/1998  . Smokeless tobacco: Never Used  . Alcohol use No     Comment: 12/23/2014 "might have a couple mixed drinks/year"     Colonoscopy:  PAP:  Bone density:  Lipid panel:  Allergies  Allergen Reactions  . Ciprofloxacin Shortness Of Breath, Itching and Rash  . Doxycycline Shortness Of Breath, Itching and Rash  . Penicillins Shortness Of Breath, Itching, Rash and Other (See Comments)    Has patient had a PCN reaction causing immediate rash, facial/tongue/throat swelling, SOB or lightheadedness with hypotension: Yes Has patient had a PCN reaction causing severe rash involving mucus membranes or skin necrosis: No Has patient had a PCN reaction that required hospitalization No Has patient had a PCN reaction occurring within the last 10 years: No If all of the above answers are "NO", then may proceed with Cephalosporin use.  . Sulfa Antibiotics Shortness Of Breath, Itching and Rash  . Morphine And Related Itching  . Albuterol Sulfate   . Cefuroxime Rash    blisters    Current Facility-Administered Medications  Medication  Dose Route Frequency Provider Last Rate Last Dose  . 0.9 %  sodium chloride infusion  250 mL Intravenous PRN Regina Bill, NP      . ALPRAZolam Duanne Moron) tablet 0.25 mg  0.25 mg Oral QHS PRN Regina Bill, NP   0.25 mg at 03/20/16 1841  . amiodarone (NEXTERONE PREMIX) 360-4.14 MG/200ML-% (1.8 mg/mL) IV infusion  60 mg/hr Intravenous Continuous Leonie Man, MD 33.3 mL/hr at 03/21/16 1707 60 mg/hr at 03/21/16 1707  . ferrous sulfate tablet 325 mg  325 mg Oral Q breakfast Fritzi Mandes, MD   325 mg at 03/21/16 1338  . fluticasone furoate-vilanterol (BREO ELLIPTA) 100-25 MCG/INH 1 puff  1 puff Inhalation Daily Regina Bill, NP   1 puff at 03/21/16 0929  . guaiFENesin (MUCINEX) 12 hr tablet 600 mg  600 mg Oral BID Regina Bill, NP   600 mg at 03/21/16 0926  . guaiFENesin (ROBITUSSIN) 100 MG/5ML solution 200 mg  10 mL Oral Q4H PRN Saundra Shelling, MD   200 mg at 03/21/16 0542  . insulin aspart (novoLOG) injection 0-15 Units  0-15 Units Subcutaneous TID WC Wilhelmina Mcardle, MD   2 Units at 03/21/16 1704  . insulin aspart (novoLOG) injection 0-5 Units  0-5 Units Subcutaneous QHS Wilhelmina Mcardle, MD      . ipratropium (ATROVENT) nebulizer solution 0.5 mg  0.5 mg Nebulization Q4H PRN Regina Bill, NP   0.5 mg at 03/21/16 1349  . metoprolol (LOPRESSOR) injection 2.5-5 mg  2.5-5 mg Intravenous Q3H PRN Wilhelmina Mcardle, MD   2.5 mg at 03/18/16 1454  . metoprolol tartrate (LOPRESSOR) tablet 25 mg  25 mg Oral QID Leonie Man, MD   25 mg at 03/21/16 1730  . ondansetron (ZOFRAN) injection 4 mg  4 mg Intravenous Q6H PRN Wilhelmina Mcardle, MD   4 mg at 03/21/16 1220  . pantoprazole (PROTONIX) EC tablet 40 mg  40 mg Oral Q1200 Wilhelmina Mcardle, MD   40 mg at 03/21/16 1130  . potassium chloride SA (K-DUR,KLOR-CON) CR tablet 20 mEq  20 mEq Oral Once Regina Bill, NP      . sertraline (ZOLOFT) tablet 50 mg  50 mg Oral Daily Regina Bill, NP   50 mg at 03/21/16 1338  . tiotropium (SPIRIVA) inhalation  capsule 18 mcg  18 mcg Inhalation Daily Regina Bill, NP   18 mcg at 03/21/16 0814    OBJECTIVE: Vitals:   03/21/16 1341 03/21/16 1730  BP: 110/66 125/69  Pulse: (!) 118 (!) 123  Resp:    Temp:       Body mass index is 31.63 kg/m.    ECOG FS:1 - Symptomatic but completely ambulatory  General: Well-developed, well-nourished, no acute distress. Eyes: Pink conjunctiva, anicteric sclera. HEENT: Normocephalic, moist mucous membranes, clear oropharnyx. Lungs: Scattered wheezing Heart: Regular rate and rhythm. No rubs, murmurs, or gallops. Abdomen: Soft, nontender, nondistended. No organomegaly noted, normoactive bowel sounds. Musculoskeletal: No edema, cyanosis, or clubbing. Neuro: Alert, answering all questions appropriately. Cranial nerves grossly intact. Skin: No rashes or petechiae noted. Psych: Normal affect.   LAB RESULTS:  Lab Results  Component Value Date   NA 139 03/21/2016   K 3.3 (L) 03/21/2016   CL 96 (L) 03/21/2016   CO2 33 (H) 03/21/2016   GLUCOSE 140 (H) 03/21/2016   BUN 51 (H) 03/21/2016   CREATININE 2.40 (H) 03/21/2016   CALCIUM 8.2 (L) 03/21/2016   PROT 5.6 (L) 03/18/2016   ALBUMIN 3.2 (L) 03/18/2016   AST 29 03/18/2016   ALT 69 (H) 03/18/2016   ALKPHOS 47 03/18/2016   BILITOT 1.1 03/18/2016   GFRNONAA 18 (L) 03/21/2016   GFRAA 21 (L) 03/21/2016    Lab Results  Component Value Date   WBC 5.6 03/21/2016   NEUTROABS 4.4 03/21/2016   HGB 8.3 (L) 03/21/2016   HCT 24.8 (L) 03/21/2016   MCV 80.3 03/21/2016   PLT 140 (L) 03/21/2016   Lab Results  Component Value Date   IRON 42 01/19/2016   TIBC 498 (H) 01/19/2016   IRONPCTSAT 8 (L) 01/19/2016   Lab Results  Component Value Date   FERRITIN 55 01/19/2016     STUDIES: Dg Chest 1 View  Result Date: 03/04/2016 CLINICAL DATA:  Respiratory distress EXAM: CHEST 1 VIEW COMPARISON:  Chest radiograph dated 01/24/2016. CT chest dated 01/21/2016. FINDINGS: Patchy bilateral lower lobe opacities,  suspicious for pneumonia, less likely atelectasis. Superimposed mild interstitial  edema is suspected. Small bilateral pleural effusions. No pneumothorax. Again noted is a stable focal opacity in the medial right upper lobe, most of which likely reflects radiation changes when correlating with prior CT, underlying residual/recurrent tumor not excluded. Cardiomegaly. IMPRESSION: Patchy bilateral lower lobe opacities, suspicious for pneumonia, less likely atelectasis. Superimposed mild interstitial edema is suspected. Small bilateral pleural effusions. Stable focal opacity in the medial right upper lobe, most of which likely reflects radiation changes when correlating with prior CT, underlying residual/recurrent tumor not excluded. Electronically Signed   By: Julian Hy M.D.   On: 03/04/2016 21:22   Dg Abd 1 View  Result Date: 03/16/2016 CLINICAL DATA:  Evaluate OG tube EXAM: ABDOMEN - 1 VIEW COMPARISON:  None. FINDINGS: The side port of the OG tube is near the GE junction. The distal tip is in the stomach. See the chest x-ray report from the same time for description of the lung bases. No other acute abnormalities. IMPRESSION: The side port of the OG tube is near the GE junction. Consider advancement of the tube further into the stomach. Electronically Signed   By: Dorise Bullion III M.D   On: 03/16/2016 18:28   Ct Chest Wo Contrast  Result Date: 03/07/2016 CLINICAL DATA:  History of COPD with respiratory distress for the past 3 days EXAM: CT CHEST WITHOUT CONTRAST TECHNIQUE: Multidetector CT imaging of the chest was performed following the standard protocol without IV contrast. COMPARISON:  03/04/2016, 01/21/2016 FINDINGS: Cardiovascular: Calcifications of the thoracic aorta are noted without aneurysmal dilatation. The cardiac structures show coronary calcifications. Mediastinum/Nodes: The thoracic inlet demonstrates some nodularity within the right lobe of the thyroid. This is stable from the prior  exam. No significant hilar or mediastinal adenopathy is noted. Lungs/Pleura: Bilateral pleural effusions are seen left slightly greater than right. Some associated lower lobe consolidation is noted again left greater than right. These changes are relatively stable from the prior exam. The ground-glass density seen in the left upper lobe is again identified and has improved somewhat in the interval from the prior exam. Scarring in the right upper lobe is again noted stable from the prior exam as well. No new focal infiltrate is seen. Upper Abdomen: Stable cystic lesions are again seen in the spleen. Right adrenal lesion is again noted and stable. Musculoskeletal: No acute bony abnormality noted. IMPRESSION: Bilateral pleural effusions and associated lower lobe consolidation stable from the prior study. Stable changes in the right upper lobe similar to that seen on the recent exam. Slight improvement in the ground-glass density within the left upper lobe. Electronically Signed   By: Inez Catalina M.D.   On: 03/07/2016 16:36   Dg Chest Port 1 View  Result Date: 03/20/2016 CLINICAL DATA:  Respiratory failure. EXAM: PORTABLE CHEST 1 VIEW COMPARISON:  03/19/2016.  01/23/2016. FINDINGS: Left IJ line in stable position. Cardiomegaly with progressive bilateral pulmonary interstitial prominence and bilateral pleural effusions consistent with progressive congestive heart failure. Persistent density in the right upper lobe without significant interim change and most likely related to radiation change. Surgical clips right upper chest. IMPRESSION: 1.  Left IJ line stable position. 2. Congestive heart failure with bilateral pulmonary interstitial edema and pleural effusions. 3. Persistent density in the right upper lobe most consistent with scarring and/or radiation change. Electronically Signed   By: Marcello Moores  Register   On: 03/20/2016 07:11  Dg Chest Port 1 View  Result Date: 03/19/2016 CLINICAL DATA:  Respiratory failure  EXAM: PORTABLE CHEST 1 VIEW COMPARISON:  03/18/2016  FINDINGS: Cardiomegaly. No frank interstitial edema. Moderate left and small right pleural effusions. Associated bilateral lower lobe opacities, likely atelectasis. Stable platelike opacity in the right upper lobe, presumably radiation changes. Left IJ venous catheter terminates in the lower SVC. IMPRESSION: Moderate left and small right pleural effusions. No frank interstitial edema. Electronically Signed   By: Julian Hy M.D.   On: 03/19/2016 11:44  Dg Chest Port 1 View  Result Date: 03/18/2016 CLINICAL DATA:  Respiratory failure. EXAM: PORTABLE CHEST 1 VIEW COMPARISON:  03/17/2016 and CT chest 03/07/2016, 01/21/2016. FINDINGS: Interval extubation. Nasogastric tube is been removed as well. Left IJ central line tip projects over the SVC. Basilar dependent airspace opacification with left lower lobe collapse/ consolidation. Small right pleural effusion and moderate left pleural effusion. Oblong opacity in the apex of the right hemi thorax is seen with fiducial markers, stable. IMPRESSION: 1. Basilar dependent airspace opacification with bilateral pleural effusions. Findings may be due to congestive heart failure. 2. Left lower lobe collapse/consolidation. 3. Oblong opacity with fiducial markers in the right upper lobe, better evaluated on cross-sectional imaging 03/07/2016 and 01/21/2016. Electronically Signed   By: Lorin Picket M.D.   On: 03/18/2016 07:59   Dg Chest Port 1 View  Result Date: 03/17/2016 CLINICAL DATA:  Acute respiratory failure. EXAM: PORTABLE CHEST 1 VIEW COMPARISON:  Chest radiograph March 16, 2016 FINDINGS: Chest radiograph is enlarged, similar to prior imaging. Calcified aortic knob. Similar pulmonary vascular congestion with increasing RIGHT, similar LEFT small to moderate pleural effusions. Interstitial prominence. Bibasilar consolidation. Similar fullness of the hilum. No pneumothorax. Endotracheal tube tip projects 2.5 cm  above the carina. Nasogastric tube past proximal stomach, distal tip not imaged. LEFT internal jugular central venous catheter distal tip projects in mid superior vena cava. Soft tissue planes and included osseous structures are nonsuspicious. Surgical clips project in LEFT upper abdomen. IMPRESSION: Stable cardiomegaly and pulmonary edema with increasing RIGHT, similar LEFT small to moderate pleural effusions. Underlying consolidation. No apparent change in life-support lines. Electronically Signed   By: Elon Alas M.D.   On: 03/17/2016 04:38   Dg Chest Port 1 View  Result Date: 03/16/2016 CLINICAL DATA:  Line placement EXAM: PORTABLE CHEST 1 VIEW COMPARISON:  March 16, 2016 FINDINGS: The ETT is in good position. A new left central line terminates in mid SVC. No pneumothorax is identified. Opacity in the medial right upper lung is similar in the interval. The left-sided pleural effusion with underlying opacity is stable. The effusion and opacity in the right base has improved. No change in cardiomegaly. No other interval changes. IMPRESSION: A new left IJ is identified in good position with no pneumothorax. The left-sided effusion and opacity is stable while the right-sided effusion and opacity has improved. Electronically Signed   By: Dorise Bullion III M.D   On: 03/16/2016 18:28   Dg Chest Port 1 View  Result Date: 03/16/2016 CLINICAL DATA:  Status post intubation and NG tube placement. Shortness of breath. EXAM: PORTABLE CHEST 1 VIEW COMPARISON:  Single-view of the chest earlier today. FINDINGS: NG tube is seen with the side port at the gastroesophageal junction and tip just within the stomach. The tube should be advanced approximately 5 cm for better positioning. New endotracheal tube is in place with the tip in good position at the level of the clavicular heads. Bilateral effusions and airspace disease persist without marked change since the study earlier today. IMPRESSION: ET tube in good  position. NG tube should be advanced approximately 5 cm  for better positioning. Bilateral pleural effusions and basilar airspace disease. Electronically Signed   By: Inge Rise M.D.   On: 03/16/2016 15:52   Dg Chest Port 1 View  Result Date: 03/16/2016 CLINICAL DATA:  Difficulty breathing today. EXAM: PORTABLE CHEST 1 VIEW COMPARISON:  Single-view of the chest 03/10/2016 and 03/04/2016. CT chest 03/07/2016. FINDINGS: Moderate bilateral pleural effusions have increased in size since the most recent examination. Associated basilar atelectasis is noted. There is cardiomegaly and mild interstitial edema. IMPRESSION: Increased moderate bilateral pleural effusions and basilar atelectasis. Cardiomegaly and mild interstitial edema. Electronically Signed   By: Inge Rise M.D.   On: 03/16/2016 14:38   Dg Chest Port 1 View  Result Date: 03/10/2016 CLINICAL DATA:  Acute respiratory failure, asthma -COPD, CHF, coronary artery disease EXAM: PORTABLE CHEST 1 VIEW COMPARISON:  Portable chest x-ray of March 04, 2016 and chest CT scan of March 07, 2016 FINDINGS: The lungs are well-expanded. There remain bilateral pleural effusions which have increased slightly in volume. The pulmonary vascularity is less engorged. The interstitial markings are less prominent. The cardiac silhouette remains enlarged. There is calcification in the wall of the aortic arch. IMPRESSION: Mild interval improvement in pulmonary interstitial edema and bilateral pleural effusions. Persistent left basilar atelectasis or pneumonia. Electronically Signed   By: David  Martinique M.D.   On: 03/10/2016 07:51    ASSESSMENT: History of stage I lung cancer, worsening anemia.  PLAN:    1. Worsening anemia: Patient's hemoglobin has been dropping over the past week. Xarelto on hold. Most recent iron stores in May revealed iron deficiency anemia, will repeat in the morning. Also given patient's chronic renal insufficiency she may benefit from Procrit in  the future. Patient does not require blood transfusion at this time, but if her hemoglobin falls below 8.0 would consider one unit of packed red blood cells given her poor respiratory status. 2. History of lung cancer: Recent CT scan on March 07, 2016 did not reveal any evidence of recurrence. No further intervention is needed. 3. Chronic renal insufficiency: Creatinine slightly increased per report, monitor. Consider Procrit as above. 4. Acute on chronic respiratory failure: Improving. 5. Chronic A. fib: Appreciate cardiology input. Xarelto on hold given he dropping hemoglobin.  Appreciate consult, will follow.   Lloyd Huger, MD   03/21/2016 6:32 PM

## 2016-03-21 NOTE — Progress Notes (Signed)
[facility] NAME: Regina Burke    MR#:  [id number]  DATE OF BIRTH:  [birth year]  SUBJECTIVE:  I still feel short winded  REVIEW OF SYSTEMS:   Review of Systems  Constitutional: Negative for chills, fever and weight loss.  HENT: Negative for ear discharge, ear pain and nosebleeds.   Eyes: Negative for blurred vision, pain and discharge.  Respiratory: Positive for shortness of breath. Negative for sputum production, wheezing and stridor.   Cardiovascular: Negative for chest pain, palpitations, orthopnea and PND.  Gastrointestinal: Negative for abdominal pain, diarrhea, nausea and vomiting.  Genitourinary: Negative for frequency and urgency.  Musculoskeletal: Negative for back pain and joint pain.  Neurological: Negative for sensory change, speech change, focal weakness and weakness.  Psychiatric/Behavioral: Negative for depression and hallucinations. The patient is not nervous/anxious.   All other systems reviewed and are negative.  Tolerating Diet:yes Tolerating PT: SNF  DRUG ALLERGIES:   Allergies  Allergen Reactions  . Ciprofloxacin Shortness Of Breath, Itching and Rash  . Doxycycline Shortness Of Breath, Itching and Rash  . Penicillins Shortness Of Breath, Itching, Rash and Other (See Comments)    Has patient had a PCN reaction causing immediate rash, facial/tongue/throat swelling, SOB or lightheadedness with hypotension: Yes Has patient had a PCN reaction causing severe rash involving mucus membranes or skin necrosis: No Has patient had a PCN reaction that required hospitalization No Has patient had a PCN reaction occurring within the last 10 years: No If all of the above answers are "NO", then may proceed with Cephalosporin use.  . Sulfa Antibiotics Shortness Of Breath, Itching and Rash  . Morphine And Related Itching  . Albuterol Sulfate   . Cefuroxime Rash    blisters    VITALS:  Blood pressure 108/85,  pulse (!) 103, temperature 98.2 F (36.8 C), resp. rate (!) 22, height '5\' 5"'$  (1.651 m), weight 86.2 kg (190 lb 1.6 oz), SpO2 98 %.  PHYSICAL EXAMINATION:   Physical Exam  GENERAL:  82 y.o.-year-old patient lying in the bed with no acute distress. obese EYES: Pupils equal, round, reactive to light and accommodation. No scleral icterus. Extraocular muscles intact.  HEENT: Head atraumatic, normocephalic. Oropharynx and nasopharynx clear.  NECK:  Supple, no jugular venous distention. No thyroid enlargement, no tenderness.  LUNGS: distant breath sounds bilaterally, no wheezing, no rales, rhonchi. No use of accessory muscles of respiration.  CARDIOVASCULAR: S1, S2 normal. No murmurs, rubs, or gallops.  ABDOMEN: Soft, nontender, nondistended. Bowel sounds present. No organomegaly or mass.  EXTREMITIES: No cyanosis, clubbing  mild edema b/l.    NEUROLOGIC: Cranial nerves II through XII are intact. No focal Motor or sensory deficits b/l.   PSYCHIATRIC:  patient is alert and oriented x 3.  SKIN: No obvious rash, lesion, or ulcer.   LABORATORY PANEL:  CBC  Recent Labs Lab 03/21/16 0508  WBC 5.6  HGB 8.3*  HCT 24.8*  PLT 140*    Chemistries   Recent Labs Lab 03/17/16 0500  03/18/16 0524  03/21/16 0500  NA  --   < > 141  < > 139  K  --   < > 4.1  < > 3.3*  CL  --   < > 102  < > 96*  CO2  --   < > 32  < > 33*  GLUCOSE  --   < > 107*  < > 140*  BUN  --   < > 56*  < >  51*  CREATININE  --   < > 2.06*  < > 2.40*  CALCIUM  --   < > 8.1*  < > 8.2*  MG 2.1  --   --   --   --   AST  --   --  29  --   --   ALT  --   --  69*  --   --   ALKPHOS  --   --  47  --   --   BILITOT  --   --  1.1  --   --   < > = values in this interval not displayed. Cardiac Enzymes  Recent Labs Lab 03/16/16 1419  TROPONINI <0.03   RADIOLOGY:  Dg Chest Port 1 View  Result Date: 03/20/2016 CLINICAL DATA:  Respiratory failure. EXAM: PORTABLE CHEST 1 VIEW COMPARISON:  03/19/2016.  01/23/2016. FINDINGS:  Left IJ line in stable position. Cardiomegaly with progressive bilateral pulmonary interstitial prominence and bilateral pleural effusions consistent with progressive congestive heart failure. Persistent density in the right upper lobe without significant interim change and most likely related to radiation change. Surgical clips right upper chest. IMPRESSION: 1.  Left IJ line stable position. 2. Congestive heart failure with bilateral pulmonary interstitial edema and pleural effusions. 3. Persistent density in the right upper lobe most consistent with scarring and/or radiation change. Electronically Signed   By: Marcello Moores  Register   On: 03/20/2016 07:11  ASSESSMENT AND PLAN:  82 yo female with a PMH of Type II diabetes, anemia, GERD, cushing's disease, asthma, CKD stage III, pneumonia, edema, paroxysmal atrial fibrillation, HTN, lung cancer s/p radiation 2015, CAD, NSTEMI, HTN, diverticulitis, and gout.  She [facility] via EMS on 7/20 with c/o worsening shortness of breath. She was recently admitted to the hospital on 03/04/16 and discharged 03/14/16 for CHF exacerbation and Afibb/RVR and was discharged on her normal supplemental oxygen of 3L.  Per EMS they found her on 3L O2 at home with O2 sats 91% and somnolent  1. Acute on chronic respiratory failure with hypoxia and hypercapnia: -Multifactorial including COPD exacerbation and acute on chronic combined CHF -Continue diuresis -Nighttime BiPAP -on 3 liter [place] oxygen  2. Acute on chronic combined CHF: -Echo with  LV systolic function with an EF of 35-40%.  -Continue IV Lasix 40 mg bid with KCl repletion  -appreciate cardiology consult with Dr Rockey Situ  3. Chronic Afib with RVR s/p DCCV last admission: -Her heart rate has been difficult to control -She did not maintain sinus rhythm s/p recent DCCV, thus the chance of her maintaining sinus rhythm is quite low -possible need for PPM for better HR contro -?EP to see -Continue Lopressor 25 mg q  6 hours and amiodarone infusion  -On Xarelto, though held this morning given her acute anemia with a drop in HGB 2.5 g/dL over the past 5 days  4. CAD: -No symptoms concerning for angina -Continue current medical therapy   5. Acute on chronic anemia: -HGB has dropped 2.5 g/dL over the past 5 days with a stable PLT count -Likely playing a role in her SOB as well -Will need to hold Xarelto given the drop in her HGB -Iron studies OK in the past Will resume po ferrous sulfate -pt has seen Hematology last year but no out pt f/u -?IV procrit  6. Acute on CKD stage III: -Renal function slightly increased this morning possibly from her anemia  -Hold Lasix for this morning    Case  discussed with Care Management/Social Worker. Management plans discussed with the patient, family and they are in agreement.  CODE STATUS: DNR  DVT Prophylaxis: SCD TOTAL TIME TAKING CARE OF THIS PATIENT: 30 minutes.  >50% time spent on counselling and coordination of care  POSSIBLE D/C IN 1-2 DAYS, DEPENDING ON CLINICAL CONDITION.  Note: This dictation was prepared with Dragon dictation along with smaller phrase technology. Any transcriptional errors that result from this process are unintentional.  Jami Ohlin M.D on 03/21/2016 at 11:45 AM  Between 7am to 6pm - Pager - [phone]  After 6pm go to [email or web address] - password [email or web address] [facility]  Office  [phone]  CC: Primary care physician; Tracie Harrier, MD

## 2016-03-21 NOTE — Progress Notes (Signed)
CSW presented bed offers to patient and her family at bedside. Accepted bed at [facility]. CSW informed Broadus John- Admissions Coordinator at [facility]. CSW will continue to follow and assist.  Ernest Pine, MSW, LCSW, [place] Social Worker [phone]

## 2016-03-21 NOTE — Care Management (Signed)
Regina Burke was polite but was struggling to breathe, thus the Chaplain will come back at a later time to have more conversation. However, the Chaplain did engage the family and patient and let them know about the services we offered and that we are here 24/7 to provide assistance. The patient and family were fine for the moment, just anxious to have the breathing treatment so that Regina Burke could breathe better.

## 2016-03-21 NOTE — Progress Notes (Signed)
ANTICOAGULATION CONSULT NOTE - Initial Consult  Pharmacy Consult for Rivaroxaban Indication: atrial fibrillation  Allergies  Allergen Reactions  . Ciprofloxacin Shortness Of Breath, Itching and Rash  . Doxycycline Shortness Of Breath, Itching and Rash  . Penicillins Shortness Of Breath, Itching, Rash and Other (See Comments)    Has patient had a PCN reaction causing immediate rash, facial/tongue/throat swelling, SOB or lightheadedness with hypotension: Yes Has patient had a PCN reaction causing severe rash involving mucus membranes or skin necrosis: No Has patient had a PCN reaction that required hospitalization No Has patient had a PCN reaction occurring within the last 10 years: No If all of the above answers are "NO", then may proceed with Cephalosporin use.  . Sulfa Antibiotics Shortness Of Breath, Itching and Rash  . Morphine And Related Itching  . Albuterol Sulfate   . Cefuroxime Rash    blisters   Patient Measurements: Height: '5\' 5"'$  (165.1 cm) Weight: 190 lb 1.6 oz (86.2 kg) IBW/kg (Calculated) : 57  Vital Signs: Temp: 98 F (36.7 C) (07/25 0928) Temp Source: Oral (07/25 0928) BP: 108/66 (07/25 0928) Pulse Rate: 113 (07/25 0928)  Labs:  Recent Labs  03/19/16 0500 03/20/16 0530 03/21/16 0500 03/21/16 0508  HGB 8.3*  --   --  8.3*  HCT 24.7*  --   --  24.8*  PLT 154  --   --  140*  CREATININE 2.15* 2.03* 2.40*  --     Estimated Creatinine Clearance: 20.3 mL/min (by C-G formula based on SCr of 2.4 mg/dL).   Medical History: Past Medical History:  Diagnosis Date  . Anemia   . Arthritis   . Asthma   . Chronic diastolic CHF (congestive heart failure) ([place])    a. 10/2015 Echo: EF 55-65%, Gr1 DD, mild MR, mildly dil LA, nl RV fxn, nl PASP.  Marland Kitchen Chronic kidney disease (CKD), stage III (moderate)   . COPD (chronic obstructive pulmonary disease) ([place])   . Coronary artery disease    a. 11/2014 NSTEMI/PCI: LM nl, LAD 11m D1 30, LCX mild dzs, OM1 20p, OM2 244mOM3  90p (2.25x8 Promus Premier DES), RCA nl.   . Cough    CHRONIC AT NIGHT  . Cushing's disease ([facility]  . Depression   . Diverticulitis   . Edema    FEET/LEGS  . GERD (gastroesophageal reflux disease)   . Gout   . History of hiatal hernia   . History of pneumonia   . HLD (hyperlipidemia)   . HOH (hard of hearing)   . Hypertension   . Hypertensive heart disease   . [facility] cancer [place] [place]   S/P radiation 2015  . Migraine   . Multiple allergies   . PAF (paroxysmal atrial fibrillation) ([facility])    a. on amio, Toprol XL, Cardizem CD, and Eliquis 2.5 mg bid (age & SCr); b. CHADS2VASc ==> 7 (CHF, HTN, age x 2, DM, vascular disease and sex category)  . Sleep apnea   . [facility] ([facility]    Assessment: [age]o female with acute respiratory failure. Patient has been on heparin drip inpatient, but was managed on rivaroxaban '15mg'$  daily at home for A. Fib. Pharmacy consulted for rivaroxaban restart.  Drop in Hgb 2.5 since admission. Thought to be anemia of chronic disease.   Plan:  Hold xarelto until clearance from hematology  MeRamond DialPharm.D Clinical Pharmacist  03/21/2016 10:40 AM

## 2016-03-21 NOTE — Progress Notes (Signed)
Physical Therapy Treatment Patient Details Name: Regina Burke MRN: [id number] DOB: August 14, 1933 Today's Date: 03/21/2016    History of Present Illness 82 yo female with a PMH of Type II diabetes, anemia, GERD, cushing's disease, asthma, CKD stage III, pneumonia, edema, paroxysmal atrial fibrillation, HTN, lung cancer s/p radiation 2015, CAD, NSTEMI, HTN, diverticulitis, and gout. She presented to [facility] ER via EMS on 7/20 with c/o worsening shortness of breath. She was recently admitted to the hospital on 03/04/16 and discharged 03/14/16 for CHF exacerbation and Afibb/RVR and was discharged on her normal supplemental oxygen of 3L.     PT Comments    Pt agreeable to PT, but feels she wishes to wait until tomorrow or so to walk due to weak Bilateral lower extremities. Discussed strengthening processes and need to practice stand/ambulation to improve strength and function. Pt demonstrates ability to ambulate with Min guard short distance with managed O2 saturation above 90% on 2 liters and no loss of balance. Pt participates in edge of bed exercises with rests as needed. Reviewed energy conservation concepts as well as rest versus work to manage vitals. Continue PT to progress strength and endurance to improve functional mobility.   Follow Up Recommendations  SNF     Equipment Recommendations       Recommendations for Other Services       Precautions / Restrictions Precautions Precautions: Fall Restrictions Weight Bearing Restrictions: No    Mobility  Bed Mobility               General bed mobility comments: Not tested; pt sitting up in bed upon arrival and wishes to return to sitting in bed  Transfers Overall transfer level: Modified independent Equipment used: Rolling walker (2 wheeled) Transfers: Sit to/from Stand Sit to Stand: Modified independent (Device/Increase time)         General transfer comment: Slow to rise, but does not require physical  assist  Ambulation/Gait Ambulation/Gait assistance: Min guard Ambulation Distance (Feet): 26 Feet Assistive device: Rolling walker (2 wheeled) Gait Pattern/deviations: Step-through pattern;Trunk flexed Gait velocity: decreased Gait velocity interpretation: <1.8 ft/sec, indicative of risk for recurrent falls General Gait Details: slow with noted shortness of breath with decrease in O2 saturation on 2L to 92%. Recovers to 96% in 1 minutes   Stairs            Wheelchair Mobility    Modified Rankin (Stroke Patients Only)       Balance Overall balance assessment: Needs assistance Sitting-balance support: No upper extremity supported;Feet unsupported Sitting balance-Leahy Scale: Good     Standing balance support: Bilateral upper extremity supported Standing balance-Leahy Scale: Fair Standing balance comment: PT notes significant weakness feelnig in BLEs with stand/ambulation                    Cognition Arousal/Alertness: Awake/alert Behavior During Therapy: WFL for tasks assessed/performed Overall Cognitive Status: Within Functional Limits for tasks assessed                      Exercises General Exercises - Lower Extremity Gluteal Sets: Strengthening;Both;10 reps;Seated Long Arc Quad: AROM;Both;10 reps;Seated (2 sets) Hip ABduction/ADduction: AROM;Both;10 reps;Seated (performed with knee extended) Straight Leg Raises: AROM;Both;10 reps;Seated Hip Flexion/Marching: AROM;Both;10 reps;Seated (2 sets) Toe Raises: AROM;Both;20 reps;Seated Heel Raises: AROM;Both;10 reps;Seated    General Comments        Pertinent Vitals/Pain Pain Assessment: No/denies pain    Home Living  Prior Function            PT Goals (current goals can now be found in the care plan section) Progress towards PT goals: Progressing toward goals    Frequency  Min 2X/week    PT Plan Current plan remains appropriate    Co-evaluation              End of Session Equipment Utilized During Treatment: Oxygen Activity Tolerance: Patient limited by fatigue Patient left: in bed;Other (comment) (sitting edge of bed; requests no alarm; will call to get up)     Time: 1134-1202 PT Time Calculation (min) (ACUTE ONLY): 28 min  Charges:  $Gait Training: 8-22 mins $Therapeutic Exercise: 8-22 mins                    G Codes:      Charlaine Dalton, PTA 03/21/2016, 1:38 PM

## 2016-03-21 NOTE — Progress Notes (Signed)
Pt requesting "something for cough" this morning, order received from Dr. Estanislado Pandy for robitussin 29m every 4 hours PRN. Will continue to monitor.

## 2016-03-21 NOTE — Progress Notes (Addendum)
Patient: Regina Burke / Admit Date: 03/16/2016 / Date of Encounter: 03/21/2016, 7:58 AM   Subjective: Has a cough. Negative 800 mL for the past 24 hours. Remains in Afib with RVR. Renal function slightly worsened this morning. HGB dropped 2.5 g/dL in the past 5 days.   Review of Systems: Review of Systems  Constitutional: Positive for malaise/fatigue.  Respiratory: Positive for cough and shortness of breath.   Cardiovascular: Negative.   Gastrointestinal: Negative.   Musculoskeletal: Negative.   Neurological: Negative.   Psychiatric/Behavioral: Negative.   All other systems reviewed and are negative.   Objective: Telemetry: Afib, low 100's bpm Physical Exam: Blood pressure 109/64, pulse (!) 104, temperature 98.1 F (36.7 C), temperature source Oral, resp. rate 19, height '5\' 5"'$  (1.651 m), weight 190 lb 1.6 oz (86.2 kg), SpO2 95 %. Body mass index is 31.63 kg/m. General: Well developed, well nourished, in no acute distress. Head: Normocephalic, atraumatic, sclera non-icteric, no xanthomas, nares are without discharge. Neck: Negative for carotid bruits. JVP not elevated. Lungs: Bilateral crackles. Breathing is unlabored. Heart: Irregularly-irregular, S1 S2 without murmurs, rubs, or gallops.  Abdomen: Soft, non-tender, non-distended with normoactive bowel sounds. No rebound/guarding. Extremities: No clubbing or cyanosis. No edema. Distal pedal pulses are 2+ and equal bilaterally. Neuro: Alert and oriented X 3. Moves all extremities spontaneously. Psych:  Responds to questions appropriately with a normal affect.   Intake/Output Summary (Last 24 hours) at 03/21/16 0758 Last data filed at 03/21/16 0447  Gross per 24 hour  Intake            503.2 ml  Output             1312 ml  Net           -808.8 ml    Inpatient Medications:  . fluticasone furoate-vilanterol  1 puff Inhalation Daily  . furosemide  40 mg Intravenous Q12H  . guaiFENesin  600 mg Oral BID  . insulin aspart   0-15 Units Subcutaneous TID WC  . insulin aspart  0-5 Units Subcutaneous QHS  . metoprolol tartrate  25 mg Oral QID  . pantoprazole  40 mg Oral Q1200  . potassium chloride  20 mEq Oral Once  . rivaroxaban  15 mg Oral Q supper  . sertraline  50 mg Oral QHS  . tiotropium  18 mcg Inhalation Daily   Infusions:  . amiodarone 60 mg/hr (03/21/16 0506)    Labs:  Recent Labs  03/20/16 0530 03/21/16 0500  NA 139 139  K 3.4* 3.3*  CL 98* 96*  CO2 34* 33*  GLUCOSE 129* 140*  BUN 52* 51*  CREATININE 2.03* 2.40*  CALCIUM 8.4* 8.2*   No results for input(s): AST, ALT, ALKPHOS, BILITOT, PROT, ALBUMIN in the last 72 hours.  Recent Labs  03/19/16 0500 03/21/16 0508  WBC 5.5 5.6  NEUTROABS  --  4.4  HGB 8.3* 8.3*  HCT 24.7* 24.8*  MCV 79.5* 80.3  PLT 154 140*   No results for input(s): CKTOTAL, CKMB, TROPONINI in the last 72 hours. Invalid input(s): POCBNP No results for input(s): HGBA1C in the last 72 hours.   Weights: Filed Weights   03/19/16 0500 03/20/16 0448 03/21/16 0347  Weight: 193 lb 5.5 oz (87.7 kg) 193 lb 5.5 oz (87.7 kg) 190 lb 1.6 oz (86.2 kg)     Radiology/Studies:  Dg Chest 1 View  Result Date: 03/04/2016 CLINICAL DATA:  Respiratory distress EXAM: CHEST 1 VIEW COMPARISON:  Chest radiograph dated 01/24/2016.  CT chest dated 01/21/2016. FINDINGS: Patchy bilateral lower lobe opacities, suspicious for pneumonia, less likely atelectasis. Superimposed mild interstitial edema is suspected. Small bilateral pleural effusions. No pneumothorax. Again noted is a stable focal opacity in the medial right upper lobe, most of which likely reflects radiation changes when correlating with prior CT, underlying residual/recurrent tumor not excluded. Cardiomegaly. IMPRESSION: Patchy bilateral lower lobe opacities, suspicious for pneumonia, less likely atelectasis. Superimposed mild interstitial edema is suspected. Small bilateral pleural effusions. Stable focal opacity in the medial right  upper lobe, most of which likely reflects radiation changes when correlating with prior CT, underlying residual/recurrent tumor not excluded. Electronically Signed   By: Julian Hy M.D.   On: 03/04/2016 21:22   Dg Abd 1 View  Result Date: 03/16/2016 CLINICAL DATA:  Evaluate OG tube EXAM: ABDOMEN - 1 VIEW COMPARISON:  None. FINDINGS: The side port of the OG tube is near the GE junction. The distal tip is in the stomach. See the chest x-ray report from the same time for description of the lung bases. No other acute abnormalities. IMPRESSION: The side port of the OG tube is near the GE junction. Consider advancement of the tube further into the stomach. Electronically Signed   By: Dorise Bullion III M.D   On: 03/16/2016 18:28   Ct Chest Wo Contrast  Result Date: 03/07/2016 CLINICAL DATA:  History of COPD with respiratory distress for the past 3 days EXAM: CT CHEST WITHOUT CONTRAST TECHNIQUE: Multidetector CT imaging of the chest was performed following the standard protocol without IV contrast. COMPARISON:  03/04/2016, 01/21/2016 FINDINGS: Cardiovascular: Calcifications of the thoracic aorta are noted without aneurysmal dilatation. The cardiac structures show coronary calcifications. Mediastinum/Nodes: The thoracic inlet demonstrates some nodularity within the right lobe of the thyroid. This is stable from the prior exam. No significant hilar or mediastinal adenopathy is noted. Lungs/Pleura: Bilateral pleural effusions are seen left slightly greater than right. Some associated lower lobe consolidation is noted again left greater than right. These changes are relatively stable from the prior exam. The ground-glass density seen in the left upper lobe is again identified and has improved somewhat in the interval from the prior exam. Scarring in the right upper lobe is again noted stable from the prior exam as well. No new focal infiltrate is seen. Upper Abdomen: Stable cystic lesions are again seen in the  spleen. Right adrenal lesion is again noted and stable. Musculoskeletal: No acute bony abnormality noted. IMPRESSION: Bilateral pleural effusions and associated lower lobe consolidation stable from the prior study. Stable changes in the right upper lobe similar to that seen on the recent exam. Slight improvement in the ground-glass density within the left upper lobe. Electronically Signed   By: Inez Catalina M.D.   On: 03/07/2016 16:36   Dg Chest Port 1 View  Result Date: 03/20/2016 CLINICAL DATA:  Respiratory failure. EXAM: PORTABLE CHEST 1 VIEW COMPARISON:  03/19/2016.  01/23/2016. FINDINGS: Left IJ line in stable position. Cardiomegaly with progressive bilateral pulmonary interstitial prominence and bilateral pleural effusions consistent with progressive congestive heart failure. Persistent density in the right upper lobe without significant interim change and most likely related to radiation change. Surgical clips right upper chest. IMPRESSION: 1.  Left IJ line stable position. 2. Congestive heart failure with bilateral pulmonary interstitial edema and pleural effusions. 3. Persistent density in the right upper lobe most consistent with scarring and/or radiation change. Electronically Signed   By: Marcello Moores  Register   On: 03/20/2016 07:11  Dg Chest [facility]  1 View  Result Date: 03/19/2016 CLINICAL DATA:  Respiratory failure EXAM: PORTABLE CHEST 1 VIEW COMPARISON:  03/18/2016 FINDINGS: Cardiomegaly. No frank interstitial edema. Moderate left and small right pleural effusions. Associated bilateral lower lobe opacities, likely atelectasis. Stable platelike opacity in the right upper lobe, presumably radiation changes. Left IJ venous catheter terminates in the lower SVC. IMPRESSION: Moderate left and small right pleural effusions. No frank interstitial edema. Electronically Signed   By: Julian Hy M.D.   On: 03/19/2016 11:44  Dg Chest Port 1 View  Result Date: 03/18/2016 CLINICAL DATA:  Respiratory  failure. EXAM: PORTABLE CHEST 1 VIEW COMPARISON:  03/17/2016 and CT chest 03/07/2016, 01/21/2016. FINDINGS: Interval extubation. Nasogastric tube is been removed as well. Left IJ central line tip projects over the SVC. Basilar dependent airspace opacification with left lower lobe collapse/ consolidation. Small right pleural effusion and moderate left pleural effusion. Oblong opacity in the apex of the right hemi thorax is seen with fiducial markers, stable. IMPRESSION: 1. Basilar dependent airspace opacification with bilateral pleural effusions. Findings may be due to congestive heart failure. 2. Left lower lobe collapse/consolidation. 3. Oblong opacity with fiducial markers in the right upper lobe, better evaluated on cross-sectional imaging 03/07/2016 and 01/21/2016. Electronically Signed   By: Lorin Picket M.D.   On: 03/18/2016 07:59   Dg Chest Port 1 View  Result Date: 03/17/2016 CLINICAL DATA:  Acute respiratory failure. EXAM: PORTABLE CHEST 1 VIEW COMPARISON:  Chest radiograph March 16, 2016 FINDINGS: Chest radiograph is enlarged, similar to prior imaging. Calcified aortic knob. Similar pulmonary vascular congestion with increasing RIGHT, similar LEFT small to moderate pleural effusions. Interstitial prominence. Bibasilar consolidation. Similar fullness of the hilum. No pneumothorax. Endotracheal tube tip projects 2.5 cm above the carina. Nasogastric tube past proximal stomach, distal tip not imaged. LEFT internal jugular central venous catheter distal tip projects in mid superior vena cava. Soft tissue planes and included osseous structures are nonsuspicious. Surgical clips project in LEFT upper abdomen. IMPRESSION: Stable cardiomegaly and pulmonary edema with increasing RIGHT, similar LEFT small to moderate pleural effusions. Underlying consolidation. No apparent change in life-support lines. Electronically Signed   By: Elon Alas M.D.   On: 03/17/2016 04:38   Dg Chest Port 1 View  Result  Date: 03/16/2016 CLINICAL DATA:  Line placement EXAM: PORTABLE CHEST 1 VIEW COMPARISON:  March 16, 2016 FINDINGS: The ETT is in good position. A new left central line terminates in mid SVC. No pneumothorax is identified. Opacity in the medial right upper lung is similar in the interval. The left-sided pleural effusion with underlying opacity is stable. The effusion and opacity in the right base has improved. No change in cardiomegaly. No other interval changes. IMPRESSION: A new left IJ is identified in good position with no pneumothorax. The left-sided effusion and opacity is stable while the right-sided effusion and opacity has improved. Electronically Signed   By: Dorise Bullion III M.D   On: 03/16/2016 18:28   Dg Chest Port 1 View  Result Date: 03/16/2016 CLINICAL DATA:  Status post intubation and NG tube placement. Shortness of breath. EXAM: PORTABLE CHEST 1 VIEW COMPARISON:  Single-view of the chest earlier today. FINDINGS: NG tube is seen with the side port at the gastroesophageal junction and tip just within the stomach. The tube should be advanced approximately 5 cm for better positioning. New endotracheal tube is in place with the tip in good position at the level of the clavicular heads. Bilateral effusions and airspace disease persist without marked change  since the study earlier today. IMPRESSION: ET tube in good position. NG tube should be advanced approximately 5 cm for better positioning. Bilateral pleural effusions and basilar airspace disease. Electronically Signed   By: Inge Rise M.D.   On: 03/16/2016 15:52   Dg Chest Port 1 View  Result Date: 03/16/2016 CLINICAL DATA:  Difficulty breathing today. EXAM: PORTABLE CHEST 1 VIEW COMPARISON:  Single-view of the chest 03/10/2016 and 03/04/2016. CT chest 03/07/2016. FINDINGS: Moderate bilateral pleural effusions have increased in size since the most recent examination. Associated basilar atelectasis is noted. There is cardiomegaly and mild  interstitial edema. IMPRESSION: Increased moderate bilateral pleural effusions and basilar atelectasis. Cardiomegaly and mild interstitial edema. Electronically Signed   By: Inge Rise M.D.   On: 03/16/2016 14:38   Dg Chest Port 1 View  Result Date: 03/10/2016 CLINICAL DATA:  Acute respiratory failure, asthma -COPD, CHF, coronary artery disease EXAM: PORTABLE CHEST 1 VIEW COMPARISON:  Portable chest x-ray of March 04, 2016 and chest CT scan of March 07, 2016 FINDINGS: The lungs are well-expanded. There remain bilateral pleural effusions which have increased slightly in volume. The pulmonary vascularity is less engorged. The interstitial markings are less prominent. The cardiac silhouette remains enlarged. There is calcification in the wall of the aortic arch. IMPRESSION: Mild interval improvement in pulmonary interstitial edema and bilateral pleural effusions. Persistent left basilar atelectasis or pneumonia. Electronically Signed   By: David  Martinique M.D.   On: 03/10/2016 07:51     Assessment and Plan  Principal Problem:   Acute on chronic combined systolic and diastolic CHF, NYHA class 3 (HCC) Active Problems:   Acute on chronic respiratory failure with hypoxia and hypercapnia (HCC) - secondary to COPD exacerbation, now with A. fib RVR   Coronary artery disease involving native coronary artery of native heart without angina pectoris   Chronic obstructive pulmonary disease with acute exacerbation (HCC)   Centrilobular emphysema (HCC)   Chronic diastolic CHF (congestive heart failure) (HCC)   Persistent atrial fibrillation ([place]): CHA2DS2-Vasc = ~7. On Xarelto 15 mg (age & renal Fxn)   Congestive dilated cardiomyopathy ([place])    1. Acute on chronic respiratory failure with hypoxia and hypercapnia: -Multifactorial including COPD exacerbation and acute on chronic combined CHF -Continue diuresis -Nighttime BiPAP  2. Acute on chronic combined CHF: -Echo with drop in LV systolic function with  an EF of 35-40%. This drop is felt to be 2/2 tachycardia-induced cardiomyopathy  -PASP 73 mm Hg and she continues to appear volume overloaded -Continue IV Lasix 40 mg bid with KCl repletion   3. Chronic Afib with RVR s/p DCCV last admission: -Her heart rate has been difficult to control -She did not maintain sinus rhythm s/p recent DCCV, thus the chance of her maintaining sinus rhythm is quite low -Her rapid heart rate is having deleterious effect of her cardiac function with plans for PPM this admission which will require transfer to [facility] once stable for transfer, not currently stable enough for this -Consider having EP see the patient at [facility] prior to transfer  -Continue Lopressor 25 mg q 6 hours and amiodarone infusion  -If heart rate increases may need to re-bolus with amiodarone over slow period given slightly soft BP -BP precludes titration of BB at this time -On Xarelto, though held this morning given her acute anemia with a drop in HGB 2.5 g/dL over the past 5 days -CHADS2VASc at least 7 (CHF, HTN, age x 2, DM, vascular disease, sex category)  4. CAD: -No  symptoms concerning for angina -Continue current medical therapy   5. Acute anemia: -HGB has dropped 2.5 g/dL over the past 5 days with a stable PLT count -Likely playing a role in her SOB as well -Will need to hold Xarelto given the drop in her HGB -Needs bleeding work up per IM  6. Acute on CKD stage III: -Renal function slightly increased this morning possibly from her anemia  -Hold Lasix for this morning    Signed, Marcille Blanco [place] Pager: [phone] 03/21/2016, 7:58 AM   Attending Note Patient seen and examined, agree with detailed note above,  Patient presentation and plan discussed on rounds.   Long discussion with patient this morning, She felt well after she was last seen in clinic early July 2017 At that time was in atrial fibrillation, placed on high-dose amiodarone 400 mg twice a  day Denied any significant symptoms until recently  Presenting in acute on chronic diastolic CHF She has had aggressive diuresis over the past 5 days Leg swelling has resolved, less abdominal bloating  New issues include anemia, worsening over the past 5 days She has a long history of anemia felt to be anemia of chronic disease She does take iron supplementation at home Previously seen by hematology late in 2016 Previous hospitalization require transfusion  This morning on examination, appears comfortable at rest though with shortness of breath on any exertion Lungs with decreased aeration left base, scant crackles at the right base, heart sounds irregular, tachycardic, abdomen soft, no leg edema  Review of telemetry shows atrial fibrillation with rate 100 up to 110 bpm She is on amiodarone infusion still Tolerating the metoprolol 25 mg every 6 hours, blood pressure low  1) atrial fibrillation with RVR Difficult to control rate, currently on amiodarone infusion, metoprolol Previous problems with digoxin, had toxicity twice, will not use Difficult to wean off amiodarone infusion given rate still above 100 -Elevated heart rate likely exacerbated by anemia and underlying COPD -Will need evaluation by EP for further treatment options. Unfortunately they are not available this week Would continue current medications for now  2) anemia Progressive over the past 5 days, long history of chronic anemia Previous signs studies were acceptable, does not appear to have iron deficiency anemia Likely anemia of chronic disease, renal associated May benefit from hematology consultation, Unclear if she needs Procrit Underlying anemia is exacerbating her shortness of breath, heart failure, atrial fibrillation with RVR Anticoagulation has been held for now, though could be restarted if black negative, cleared by hematology  3) COPD Underlying emphysema, contributing to her shortness of breath No  active exacerbation  4) acute on chronic diastolic CHF Would hold Lasix for now given climb in renal function, creatinine and BUN Appearing prerenal, not grossly fluid overloaded Would reevaluate lab work tomorrow    Greater than 50% was spent in counseling and coordination of care with patient Total encounter time 35 minutes or more   Signed: Esmond Plants  M.D., Ph.D. [facility]

## 2016-03-22 ENCOUNTER — Telehealth: Payer: Self-pay | Admitting: *Deleted

## 2016-03-22 ENCOUNTER — Ambulatory Visit: Payer: Medicare Other | Admitting: Family

## 2016-03-22 DIAGNOSIS — J9620 Acute and chronic respiratory failure, unspecified whether with hypoxia or hypercapnia: Secondary | ICD-10-CM

## 2016-03-22 DIAGNOSIS — G4733 Obstructive sleep apnea (adult) (pediatric): Secondary | ICD-10-CM

## 2016-03-22 LAB — BASIC METABOLIC PANEL
Anion gap: 12 (ref 5–15)
BUN: 51 mg/dL — ABNORMAL HIGH (ref 6–20)
CALCIUM: 8.3 mg/dL — AB (ref 8.9–10.3)
CO2: 31 mmol/L (ref 22–32)
CREATININE: 2.52 mg/dL — AB (ref 0.44–1.00)
Chloride: 95 mmol/L — ABNORMAL LOW (ref 101–111)
GFR calc non Af Amer: 17 mL/min — ABNORMAL LOW (ref >60)
GFR, EST AFRICAN AMERICAN: 20 mL/min — AB (ref >60)
Glucose, Bld: 150 mg/dL — ABNORMAL HIGH (ref 65–99)
Potassium: 3.7 mmol/L (ref 3.5–5.1)
SODIUM: 138 mmol/L (ref 135–145)

## 2016-03-22 LAB — FERRITIN: Ferritin: 41 ng/mL (ref 11–307)

## 2016-03-22 LAB — GLUCOSE, CAPILLARY
GLUCOSE-CAPILLARY: 141 mg/dL — AB (ref 65–99)
GLUCOSE-CAPILLARY: 144 mg/dL — AB (ref 65–99)
Glucose-Capillary: 123 mg/dL — ABNORMAL HIGH (ref 65–99)
Glucose-Capillary: 133 mg/dL — ABNORMAL HIGH (ref 65–99)

## 2016-03-22 LAB — VITAMIN B12: Vitamin B-12: 1426 pg/mL — ABNORMAL HIGH (ref 180–914)

## 2016-03-22 LAB — LACTATE DEHYDROGENASE: LDH: 144 U/L (ref 98–192)

## 2016-03-22 LAB — IRON AND TIBC
Iron: 25 ug/dL — ABNORMAL LOW (ref 28–170)
Saturation Ratios: 6 % — ABNORMAL LOW (ref 10.4–31.8)
TIBC: 403 ug/dL (ref 250–450)
UIBC: 378 ug/dL

## 2016-03-22 LAB — FOLATE: FOLATE: 24 ng/mL (ref >5.9)

## 2016-03-22 MED ORDER — SODIUM CHLORIDE 0.9 % IV SOLN
510.0000 mg | Freq: Once | INTRAVENOUS | Status: AC
  Administered 2016-03-22: 510 mg via INTRAVENOUS
  Filled 2016-03-22: qty 17

## 2016-03-22 MED ORDER — AMIODARONE HCL 200 MG PO TABS
400.0000 mg | ORAL_TABLET | Freq: Two times a day (BID) | ORAL | Status: DC
  Administered 2016-03-22 – 2016-03-30 (×17): 400 mg via ORAL
  Filled 2016-03-22 (×17): qty 2

## 2016-03-22 MED ORDER — FUROSEMIDE 10 MG/ML IJ SOLN
40.0000 mg | Freq: Two times a day (BID) | INTRAMUSCULAR | Status: DC
  Administered 2016-03-22: 40 mg via INTRAVENOUS
  Filled 2016-03-22 (×2): qty 4

## 2016-03-22 MED ORDER — POTASSIUM CHLORIDE CRYS ER 20 MEQ PO TBCR
20.0000 meq | EXTENDED_RELEASE_TABLET | Freq: Two times a day (BID) | ORAL | Status: DC
  Administered 2016-03-22 – 2016-03-24 (×6): 20 meq via ORAL
  Filled 2016-03-22 (×6): qty 1

## 2016-03-22 NOTE — Progress Notes (Addendum)
Patient c/o SOB on 3L of oxygen. Saturations 100% and lungs clear bilateral. Patient placed on bipap. Dr. Posey Pronto notified and ordered to cont bipap at this time. Regina Burke   Per Christell Faith, PA hold patient lasix until tomorrow d/t kidney fxn.

## 2016-03-22 NOTE — Progress Notes (Signed)
PT Cancellation Note  Patient Details Name: [name] MRN: [id number] DOB: 09/11/1932   Cancelled Treatment:    Reason Eval/Treat Not Completed: Patient declined, no reason specified. Treatment attempted; pt on Bipap and requests removal. Nursing removed. Pt refuses PT today due to increased shortness of breath today, weakness and general malaise. Nurse reports pt with increased difficulty breathing today and could hardly speak this morning. Pt is using Bipap as needed alternated with 3 O2 via nasal cannula to manage. Re attempt treatment at a later time/date as the schedule allows.    Charlaine Dalton, [place] 03/22/2016, 12:06 PM

## 2016-03-22 NOTE — Telephone Encounter (Signed)
Per Suanne Marker, pt will have to do a sleep study to get started on a BiPAP machine. Order placed for split night. Nothing further needed.

## 2016-03-22 NOTE — Telephone Encounter (Signed)
-----   Message from Laverle Hobby, MD sent at 03/22/2016  9:42 AM EDT ----- Regarding: hfu Pt needs fu with me in 4-6 weeks for OSA. She also needs to be switched from CPAP to an auto BiPAP, low pressure. 5., High pressure 18, pressure support of 5.  Re: Patient was tried on CPAP and intolerant to CPAP. Recent sleep study on November 2016 positive for OSA.

## 2016-03-22 NOTE — Progress Notes (Signed)
CSW spoke to MD Posey Pronto and discussed discharge plans for patient. Per MD Posey Pronto patient will possibly discharge on Thursday July 27 or Friday July 28. CSW updated Broadus John- Admissions Coordinator at [facility]. CSW will continue to follow and assist.  Ernest Pine, MSW, LCSW, [place] Social Worker [phone]

## 2016-03-22 NOTE — Progress Notes (Signed)
[facility] [place] NAME: Regina Burke    MR#:  [id number]  DATE OF BIRTH:  1932-09-09  SUBJECTIVE:  I still feel short winded. Using BIPAP helps  REVIEW OF SYSTEMS:   Review of Systems  Constitutional: Negative for chills, fever and weight loss.  HENT: Negative for ear discharge, ear pain and nosebleeds.   Eyes: Negative for blurred vision, pain and discharge.  Respiratory: Positive for shortness of breath. Negative for sputum production, wheezing and stridor.   Cardiovascular: Negative for chest pain, palpitations, orthopnea and PND.  Gastrointestinal: Negative for abdominal pain, diarrhea, nausea and vomiting.  Genitourinary: Negative for frequency and urgency.  Musculoskeletal: Negative for back pain and joint pain.  Neurological: Negative for sensory change, speech change, focal weakness and weakness.  Psychiatric/Behavioral: Negative for depression and hallucinations. The patient is not nervous/anxious.   All other systems reviewed and are negative.  Tolerating Diet:yes Tolerating PT: SNF  DRUG ALLERGIES:   Allergies  Allergen Reactions  . Ciprofloxacin Shortness Of Breath, Itching and Rash  . Doxycycline Shortness Of Breath, Itching and Rash  . Penicillins Shortness Of Breath, Itching, Rash and Other (See Comments)    Has patient had a PCN reaction causing immediate rash, facial/tongue/throat swelling, SOB or lightheadedness with hypotension: Yes Has patient had a PCN reaction causing severe rash involving mucus membranes or skin necrosis: No Has patient had a PCN reaction that required hospitalization No Has patient had a PCN reaction occurring within the last 10 years: No If all of the above answers are "NO", then may proceed with Cephalosporin use.  . Sulfa Antibiotics Shortness Of Breath, Itching and Rash  . Morphine And Related Itching  . Albuterol Sulfate   . Cefuroxime Rash    blisters    VITALS:  Blood  pressure 122/60, pulse (!) 106, temperature 97.5 F (36.4 C), temperature source Oral, resp. rate 18, height '5\' 5"'$  (1.651 m), weight 87.2 kg (192 lb 4.8 oz), SpO2 100 %.  PHYSICAL EXAMINATION:   Physical Exam  GENERAL:  82 y.o.-year-old patient lying in the bed with no acute distress. obese EYES: Pupils equal, round, reactive to light and accommodation. No scleral icterus. Extraocular muscles intact.  HEENT: Head atraumatic, normocephalic. Oropharynx and nasopharynx clear.  NECK:  Supple, no jugular venous distention. No thyroid enlargement, no tenderness.  LUNGS: distant breath sounds bilaterally, no wheezing, no rales, rhonchi. No use of accessory muscles of respiration.  CARDIOVASCULAR: S1, S2 normal. No murmurs, rubs, or gallops.  ABDOMEN: Soft, nontender, nondistended. Bowel sounds present. No organomegaly or mass.  EXTREMITIES: No cyanosis, clubbing  mild edema b/l.    NEUROLOGIC: Cranial nerves II through XII are intact. No focal Motor or sensory deficits b/l.   PSYCHIATRIC:  patient is alert and oriented x 3.  SKIN: No obvious rash, lesion, or ulcer.   LABORATORY PANEL:  CBC  Recent Labs Lab 03/21/16 0508  WBC 5.6  HGB 8.3*  HCT 24.8*  PLT 140*    Chemistries   Recent Labs Lab 03/17/16 0500  03/18/16 0524  03/22/16 0404  NA  --   < > 141  < > 138  K  --   < > 4.1  < > 3.7  CL  --   < > 102  < > 95*  CO2  --   < > 32  < > 31  GLUCOSE  --   < > 107*  < > 150*  BUN  --   < >  56*  < > 51*  CREATININE  --   < > 2.06*  < > 2.52*  CALCIUM  --   < > 8.1*  < > 8.3*  MG 2.1  --   --   --   --   AST  --   --  29  --   --   ALT  --   --  69*  --   --   ALKPHOS  --   --  47  --   --   BILITOT  --   --  1.1  --   --   < > = values in this interval not displayed. Cardiac Enzymes  Recent Labs Lab 03/16/16 1419  TROPONINI <0.03   RADIOLOGY:  No results found. ASSESSMENT AND PLAN:  82 yo female with a PMH of Type II diabetes, anemia, GERD, cushing's disease,  asthma, CKD stage III, pneumonia, edema, paroxysmal atrial fibrillation, HTN, lung cancer s/p radiation 2015, CAD, NSTEMI, HTN, diverticulitis, and gout.  She presented to [facility] ER via EMS on 7/20 with c/o worsening shortness of breath. She was recently admitted to the hospital on 03/04/16 and discharged 03/14/16 for CHF exacerbation and Afibb/RVR and was discharged on her normal supplemental oxygen of 3L.  Per EMS they found her on 3L O2 at home with O2 sats 91% and somnolent  1. Acute on chronic respiratory failure with hypoxia and hypercapnia: -Multifactorial including COPD exacerbation and acute on chronic combined CHF -Continue diuresis -Nighttime BiPAP -on 3 liter [place] oxygen  2. Acute on chronic combined CHF: -Echo with  LV systolic function with an EF of 35-40%.  -Lasix on hold due to rising creatinine -appreciate cardiology consult with Dr Rockey Situ  3. Chronic Afib with RVR s/p DCCV last admission: -Her heart rate has been difficult to control -She did not maintain sinus rhythm s/p recent DCCV, thus the chance of her maintaining sinus rhythm is quite low -possible need for PPM for better HR contro -?EP to see [facility] outpt -Continue Lopressor 25 mg q 6 hours and amiodarone infusion  -On Xarelto, though held  given her acute anemia with a drop in HGB 2.5 g/dL over the past 5 days  4. CAD: -No symptoms concerning for angina -Continue current medical therapy   5. Acute on chronic anemia: -HGB has dropped 2.5 g/dL over the past 5 days with a stable PLT count -Likely playing a role in her SOB as well -Will need to hold Xarelto given the drop in her HGB -Iron studies show IDA Will resume po ferrous sulfate ad start IV iron per Dr Shari Heritage  6. Acute on CKD stage III: -Renal function slightly increased this morning possibly from her anemia  -Hold Lasix for this morning   &/ D/c planning to SNF in next couple days  Case discussed with Care Management/Social Worker. Management plans  discussed with the patient, family and they are in agreement.  CODE STATUS: DNR  DVT Prophylaxis: SCD TOTAL TIME TAKING CARE OF THIS PATIENT: 30 minutes.  >50% time spent on counselling and coordination of care  POSSIBLE D/C IN 1-2 DAYS, DEPENDING ON CLINICAL CONDITION.  Note: This dictation was prepared with Dragon dictation along with smaller phrase technology. Any transcriptional errors that result from this process are unintentional.  Dionne Rossa M.D on 03/22/2016 at 11:38 AM  Between 7am to 6pm - Pager - [phone]  After 6pm go to [email or web address] - [facility]  Office  [phone]  CC: Primary care physician; Tracie Harrier, MD

## 2016-03-22 NOTE — Progress Notes (Signed)
* [facility]     Assessment and Plan:  Acute on chronic hypercapnic respiratory failure -Sleep Study 06/2015 positive for OSA, required CPAP '@15'$ .  -CPAP was attempted however pt was intolerant of CPAP,  recommend that patient she be switched to bipap outpatient for further treatment of OSA, hypercapnia, Atrial fibrillation.  -My office will arrange for the patient be started on home auto-BiPAP titration, the meantime, the patient does she be continued on BiPAP overnight at rehabilitation center at the current pressure.  Afib with RVR -May be contributed to by OSA.   Hx of asthma, lung cancer s/p radiation 2015  Pulmonary hypertension - likely Group 2/3   Date: 03/22/2016  MRN# [id number] Regina Burke Mar 19, 1933   Regina Burke is a 82 y.o. old female seen in follow up for chief complaint of  Chief Complaint  Patient presents with  . Shortness of Breath     HPI:   No new complaints today, attempted Cpap overnight, but overall had rough night And could not tolerate the pressure. Review of notes overnight shows that the patient was attempted on CPAP at her recommended pressure of 15, however, she was intolerant of this due to high pressures and feeling that she could not get in a good breath. She was then switched to BiPAP with good tolerance of BiPAP.  Allergies:  Ciprofloxacin; Doxycycline; Penicillins; Sulfa antibiotics; Morphine and related; Albuterol sulfate; and Cefuroxime  Review of Systems: Gen:  Denies  fever, sweats. HEENT: Denies blurred vision. Cvc:  No dizziness, chest pain or heaviness Resp:   Denies cough or sputum porduction. Gi: Denies swallowing difficulty, stomach pain. constipation, bowel incontinence Gu:  Denies bladder incontinence, burning urine Ext:   No Joint pain, stiffness. Skin: No skin rash, easy bruising. Endoc:  No polyuria, polydipsia. Psych: No depression, insomnia. Other:  All other systems were reviewed and  found to be negative other than what is mentioned in the HPI.   Physical Examination:   VS: BP 111/62 (BP Location: Left Arm)   Pulse (!) 53   Temp 97.5 F (36.4 C) (Oral)   Resp 18   Ht '5\' 5"'$  (1.651 m)   Wt 192 lb 4.8 oz (87.2 kg)   SpO2 96%   BMI 32.00 kg/m   General Appearance: No distress  Neuro:without focal findings,  speech normal,  HEENT: PERRLA, EOM intact. Pulmonary: normal breath sounds, No wheezing.   CardiovascularNormal S1,S2.  No m/r/g.   Abdomen: Benign, Soft, non-tender. Renal:  No costovertebral tenderness  GU:  Not performed at this time. Endoc: No evident thyromegaly, no signs of acromegaly. Skin:   warm, no rash. Extremities: normal, no cyanosis, clubbing.   LABORATORY PANEL:   CBC  Recent Labs Lab 03/21/16 0508  WBC 5.6  HGB 8.3*  HCT 24.8*  PLT 140*   ------------------------------------------------------------------------------------------------------------------  Chemistries   Recent Labs Lab 03/17/16 0500  03/18/16 0524  03/22/16 0404  NA  --   < > 141  < > 138  K  --   < > 4.1  < > 3.7  CL  --   < > 102  < > 95*  CO2  --   < > 32  < > 31  GLUCOSE  --   < > 107*  < > 150*  BUN  --   < > 56*  < > 51*  CREATININE  --   < > 2.06*  < > 2.52*  CALCIUM  --   < >  8.1*  < > 8.3*  MG 2.1  --   --   --   --   AST  --   --  29  --   --   ALT  --   --  69*  --   --   ALKPHOS  --   --  47  --   --   BILITOT  --   --  1.1  --   --   < > = values in this interval not displayed. ------------------------------------------------------------------------------------------------------------------  Cardiac Enzymes  Recent Labs Lab 03/16/16 1419  TROPONINI <0.03   ------------------------------------------------------------  RADIOLOGY:   No results found for this or any previous visit. Results for orders placed during the hospital encounter of 01/19/16  DG Chest 2 View   Narrative CLINICAL DATA:  Dyspnea, hypoxia  EXAM: CHEST  2  VIEW  COMPARISON:  11/24/2015 and 01/12/2016  FINDINGS: Cardiomediastinal silhouette is stable. Again noted scarring and surgical clips in right upper lobe medially. No acute infiltrate or pleural effusion. No pulmonary edema. Osteopenia and mild degenerative changes thoracic spine. Hyperinflation again noted.  IMPRESSION: No active disease. Hyperinflation again noted. Stable scarring and surgical clips in right upper lobe medially. Osteopenia and mild degenerative changes thoracic spine.   Electronically Signed   By: Lahoma Crocker M.D.   On: 01/19/2016 16:28    ------------------------------------------------------------------------------------------------------------------  Thank  you for allowing [facility] to assist in the care of your patient. Our recommendations are noted above.  Please contact us if we can be of further service.   Marda Stalker, MD.  [facility] Office Number: [phone]  Patricia Pesa, M.D.  Vilinda Boehringer, M.D.  Merton Border, M.D  03/22/2016

## 2016-03-22 NOTE — Care Management (Signed)
Plan is to [facility] in next 24-48 hours with Bipap

## 2016-03-22 NOTE — Progress Notes (Signed)
Patient: Regina Burke / Admit Date: 03/16/2016 / Date of Encounter: 03/22/2016, 8:50 AM   Subjective: Positive 190 mL for the past 24 hours and a net positive of 90 mL for the admission. Planning for IV iron infusion today given her anemia. On BiPAP overnight. SOB this morning.   Review of Systems: Review of Systems  Constitutional: Positive for malaise/fatigue. Negative for chills, diaphoresis, fever and weight loss.  HENT: Negative for congestion.   Eyes: Negative for discharge and redness.  Respiratory: Positive for cough and shortness of breath. Negative for sputum production and wheezing.   Cardiovascular: Negative for chest pain, palpitations, orthopnea, claudication, leg swelling and PND.  Gastrointestinal: Negative for abdominal pain, heartburn, nausea and vomiting.  Musculoskeletal: Negative for falls and myalgias.  Skin: Negative for rash.  Neurological: Positive for weakness. Negative for dizziness, tingling, tremors, sensory change, speech change, focal weakness and loss of consciousness.  Endo/Heme/Allergies: Does not bruise/bleed easily.  Psychiatric/Behavioral: Negative for substance abuse. The patient is not nervous/anxious.   All other systems reviewed and are negative.   Objective: Telemetry: Afib with RVR, low 100's bpm Physical Exam: Blood pressure 111/62, pulse (!) 53, temperature 97.5 F (36.4 C), temperature source Oral, resp. rate 18, height '5\' 5"'$  (1.651 m), weight 192 lb 4.8 oz (87.2 kg), SpO2 96 %. Body mass index is 32 kg/m. General: Well developed, well nourished, in no acute distress. Head: Normocephalic, atraumatic, sclera non-icteric, no xanthomas, nares are without discharge. Neck: Negative for carotid bruits. JVP not elevated. Lungs: Decreased breath sounds bilaterally with diffuse bilateral crackles.  Heart: Irregularly-irregular S1 S2 without murmurs, rubs, or gallops.  Abdomen: Soft, non-tender, non-distended with normoactive bowel sounds. No  rebound/guarding. Extremities: No clubbing or cyanosis. No edema. Distal pedal pulses are 2+ and equal bilaterally. Neuro: Alert and oriented X 3. Moves all extremities spontaneously. Psych:  Responds to questions appropriately with a normal affect.   Intake/Output Summary (Last 24 hours) at 03/22/16 0850 Last data filed at 03/22/16 0835  Gross per 24 hour  Intake              600 ml  Output              330 ml  Net              270 ml    Inpatient Medications:  . ferrous sulfate  325 mg Oral Q breakfast  . fluticasone furoate-vilanterol  1 puff Inhalation Daily  . guaiFENesin  600 mg Oral BID  . insulin aspart  0-15 Units Subcutaneous TID WC  . insulin aspart  0-5 Units Subcutaneous QHS  . metoprolol tartrate  25 mg Oral QID  . pantoprazole  40 mg Oral Q1200  . potassium chloride  20 mEq Oral Once  . sertraline  50 mg Oral Daily  . tiotropium  18 mcg Inhalation Daily   Infusions:  . amiodarone 60 mg/hr (03/22/16 0443)    Labs:  Recent Labs  03/20/16 0530 03/21/16 0500  NA 139 139  K 3.4* 3.3*  CL 98* 96*  CO2 34* 33*  GLUCOSE 129* 140*  BUN 52* 51*  CREATININE 2.03* 2.40*  CALCIUM 8.4* 8.2*   No results for input(s): AST, ALT, ALKPHOS, BILITOT, PROT, ALBUMIN in the last 72 hours.  Recent Labs  03/21/16 0508  WBC 5.6  NEUTROABS 4.4  HGB 8.3*  HCT 24.8*  MCV 80.3  PLT 140*   No results for input(s): CKTOTAL, CKMB, TROPONINI in the last  72 hours. Invalid input(s): POCBNP No results for input(s): HGBA1C in the last 72 hours.   Weights: Filed Weights   03/20/16 0448 03/21/16 0347 03/22/16 0342  Weight: 193 lb 5.5 oz (87.7 kg) 190 lb 1.6 oz (86.2 kg) 192 lb 4.8 oz (87.2 kg)     Radiology/Studies:  Dg Chest 1 View  Result Date: 03/04/2016 CLINICAL DATA:  Respiratory distress EXAM: CHEST 1 VIEW COMPARISON:  Chest radiograph dated 01/24/2016. CT chest dated 01/21/2016. FINDINGS: Patchy bilateral lower lobe opacities, suspicious for pneumonia, less likely  atelectasis. Superimposed mild interstitial edema is suspected. Small bilateral pleural effusions. No pneumothorax. Again noted is a stable focal opacity in the medial right upper lobe, most of which likely reflects radiation changes when correlating with prior CT, underlying residual/recurrent tumor not excluded. Cardiomegaly. IMPRESSION: Patchy bilateral lower lobe opacities, suspicious for pneumonia, less likely atelectasis. Superimposed mild interstitial edema is suspected. Small bilateral pleural effusions. Stable focal opacity in the medial right upper lobe, most of which likely reflects radiation changes when correlating with prior CT, underlying residual/recurrent tumor not excluded. Electronically Signed   By: Julian Hy M.D.   On: 03/04/2016 21:22   Dg Abd 1 View  Result Date: 03/16/2016 CLINICAL DATA:  Evaluate OG tube EXAM: ABDOMEN - 1 VIEW COMPARISON:  None. FINDINGS: The side port of the OG tube is near the GE junction. The distal tip is in the stomach. See the chest x-ray report from the same time for description of the lung bases. No other acute abnormalities. IMPRESSION: The side port of the OG tube is near the GE junction. Consider advancement of the tube further into the stomach. Electronically Signed   By: Dorise Bullion III M.D   On: 03/16/2016 18:28   Ct Chest Wo Contrast  Result Date: 03/07/2016 CLINICAL DATA:  History of COPD with respiratory distress for the past 3 days EXAM: CT CHEST WITHOUT CONTRAST TECHNIQUE: Multidetector CT imaging of the chest was performed following the standard protocol without IV contrast. COMPARISON:  03/04/2016, 01/21/2016 FINDINGS: Cardiovascular: Calcifications of the thoracic aorta are noted without aneurysmal dilatation. The cardiac structures show coronary calcifications. Mediastinum/Nodes: The thoracic inlet demonstrates some nodularity within the right lobe of the thyroid. This is stable from the prior exam. No significant hilar or  mediastinal adenopathy is noted. Lungs/Pleura: Bilateral pleural effusions are seen left slightly greater than right. Some associated lower lobe consolidation is noted again left greater than right. These changes are relatively stable from the prior exam. The ground-glass density seen in the left upper lobe is again identified and has improved somewhat in the interval from the prior exam. Scarring in the right upper lobe is again noted stable from the prior exam as well. No new focal infiltrate is seen. Upper Abdomen: Stable cystic lesions are again seen in the spleen. Right adrenal lesion is again noted and stable. Musculoskeletal: No acute bony abnormality noted. IMPRESSION: Bilateral pleural effusions and associated lower lobe consolidation stable from the prior study. Stable changes in the right upper lobe similar to that seen on the recent exam. Slight improvement in the ground-glass density within the left upper lobe. Electronically Signed   By: Inez Catalina M.D.   On: 03/07/2016 16:36   Dg Chest Port 1 View  Result Date: 03/20/2016 CLINICAL DATA:  Respiratory failure. EXAM: PORTABLE CHEST 1 VIEW COMPARISON:  03/19/2016.  01/23/2016. FINDINGS: Left IJ line in stable position. Cardiomegaly with progressive bilateral pulmonary interstitial prominence and bilateral pleural effusions consistent with progressive congestive  heart failure. Persistent density in the right upper lobe without significant interim change and most likely related to radiation change. Surgical clips right upper chest. IMPRESSION: 1.  Left IJ line stable position. 2. Congestive heart failure with bilateral pulmonary interstitial edema and pleural effusions. 3. Persistent density in the right upper lobe most consistent with scarring and/or radiation change. Electronically Signed   By: Marcello Moores  Register   On: 03/20/2016 07:11  Dg Chest Port 1 View  Result Date: 03/19/2016 CLINICAL DATA:  Respiratory failure EXAM: PORTABLE CHEST 1 VIEW  COMPARISON:  03/18/2016 FINDINGS: Cardiomegaly. No frank interstitial edema. Moderate left and small right pleural effusions. Associated bilateral lower lobe opacities, likely atelectasis. Stable platelike opacity in the right upper lobe, presumably radiation changes. Left IJ venous catheter terminates in the lower SVC. IMPRESSION: Moderate left and small right pleural effusions. No frank interstitial edema. Electronically Signed   By: Julian Hy M.D.   On: 03/19/2016 11:44  Dg Chest Port 1 View  Result Date: 03/18/2016 CLINICAL DATA:  Respiratory failure. EXAM: PORTABLE CHEST 1 VIEW COMPARISON:  03/17/2016 and CT chest 03/07/2016, 01/21/2016. FINDINGS: Interval extubation. Nasogastric tube is been removed as well. Left IJ central line tip projects over the SVC. Basilar dependent airspace opacification with left lower lobe collapse/ consolidation. Small right pleural effusion and moderate left pleural effusion. Oblong opacity in the apex of the right hemi thorax is seen with fiducial markers, stable. IMPRESSION: 1. Basilar dependent airspace opacification with bilateral pleural effusions. Findings may be due to congestive heart failure. 2. Left lower lobe collapse/consolidation. 3. Oblong opacity with fiducial markers in the right upper lobe, better evaluated on cross-sectional imaging 03/07/2016 and 01/21/2016. Electronically Signed   By: Lorin Picket M.D.   On: 03/18/2016 07:59   Dg Chest Port 1 View  Result Date: 03/17/2016 CLINICAL DATA:  Acute respiratory failure. EXAM: PORTABLE CHEST 1 VIEW COMPARISON:  Chest radiograph March 16, 2016 FINDINGS: Chest radiograph is enlarged, similar to prior imaging. Calcified aortic knob. Similar pulmonary vascular congestion with increasing RIGHT, similar LEFT small to moderate pleural effusions. Interstitial prominence. Bibasilar consolidation. Similar fullness of the hilum. No pneumothorax. Endotracheal tube tip projects 2.5 cm above the carina. Nasogastric  tube past proximal stomach, distal tip not imaged. LEFT internal jugular central venous catheter distal tip projects in mid superior vena cava. Soft tissue planes and included osseous structures are nonsuspicious. Surgical clips project in LEFT upper abdomen. IMPRESSION: Stable cardiomegaly and pulmonary edema with increasing RIGHT, similar LEFT small to moderate pleural effusions. Underlying consolidation. No apparent change in life-support lines. Electronically Signed   By: Elon Alas M.D.   On: 03/17/2016 04:38   Dg Chest Port 1 View  Result Date: 03/16/2016 CLINICAL DATA:  Line placement EXAM: PORTABLE CHEST 1 VIEW COMPARISON:  March 16, 2016 FINDINGS: The ETT is in good position. A new left central line terminates in mid SVC. No pneumothorax is identified. Opacity in the medial right upper lung is similar in the interval. The left-sided pleural effusion with underlying opacity is stable. The effusion and opacity in the right base has improved. No change in cardiomegaly. No other interval changes. IMPRESSION: A new left IJ is identified in good position with no pneumothorax. The left-sided effusion and opacity is stable while the right-sided effusion and opacity has improved. Electronically Signed   By: Dorise Bullion III M.D   On: 03/16/2016 18:28   Dg Chest Port 1 View  Result Date: 03/16/2016 CLINICAL DATA:  Status post intubation and  NG tube placement. Shortness of breath. EXAM: PORTABLE CHEST 1 VIEW COMPARISON:  Single-view of the chest earlier today. FINDINGS: NG tube is seen with the side port at the gastroesophageal junction and tip just within the stomach. The tube should be advanced approximately 5 cm for better positioning. New endotracheal tube is in place with the tip in good position at the level of the clavicular heads. Bilateral effusions and airspace disease persist without marked change since the study earlier today. IMPRESSION: ET tube in good position. NG tube should be  advanced approximately 5 cm for better positioning. Bilateral pleural effusions and basilar airspace disease. Electronically Signed   By: Inge Rise M.D.   On: 03/16/2016 15:52   Dg Chest Port 1 View  Result Date: 03/16/2016 CLINICAL DATA:  Difficulty breathing today. EXAM: PORTABLE CHEST 1 VIEW COMPARISON:  Single-view of the chest 03/10/2016 and 03/04/2016. CT chest 03/07/2016. FINDINGS: Moderate bilateral pleural effusions have increased in size since the most recent examination. Associated basilar atelectasis is noted. There is cardiomegaly and mild interstitial edema. IMPRESSION: Increased moderate bilateral pleural effusions and basilar atelectasis. Cardiomegaly and mild interstitial edema. Electronically Signed   By: Inge Rise M.D.   On: 03/16/2016 14:38   Dg Chest Port 1 View  Result Date: 03/10/2016 CLINICAL DATA:  Acute respiratory failure, asthma -COPD, CHF, coronary artery disease EXAM: PORTABLE CHEST 1 VIEW COMPARISON:  Portable chest x-ray of March 04, 2016 and chest CT scan of March 07, 2016 FINDINGS: The lungs are well-expanded. There remain bilateral pleural effusions which have increased slightly in volume. The pulmonary vascularity is less engorged. The interstitial markings are less prominent. The cardiac silhouette remains enlarged. There is calcification in the wall of the aortic arch. IMPRESSION: Mild interval improvement in pulmonary interstitial edema and bilateral pleural effusions. Persistent left basilar atelectasis or pneumonia. Electronically Signed   By: Falesha Schommer  [place] M.D.   On: 03/10/2016 07:51     Assessment and Plan  Principal Problem:   Acute on chronic combined systolic and diastolic CHF, NYHA class 3 (HCC) Active Problems:   Acute on chronic respiratory failure with hypoxia and hypercapnia (HCC) - secondary to COPD exacerbation, now with A. fib RVR   Coronary artery disease involving native coronary artery of native heart without angina pectoris    Chronic obstructive pulmonary disease with acute exacerbation (HCC)   Centrilobular emphysema (HCC)   Chronic diastolic CHF (congestive heart failure) (HCC)   Persistent atrial fibrillation ([place]): CHA2DS2-Vasc = ~7. On Xarelto 15 mg (age & renal Fxn)   Congestive dilated cardiomyopathy ([place])   1. Acute on chronic respiratory failure with hypoxia and hypercapnia: -Multifactorial including COPD exacerbation and acute on chronic combined CHF -Continue diuresis -Nighttime BiPAP  2. Acute on chronic combined CHF: -Echo with drop in LV systolic function with an EF of 35-40%. This drop is felt to be 2/2 tachycardia-induced cardiomyopathy  -PASP 73 mm Hg and she continues to appear volume overloaded -back on IV Lasix 40 mg bid with KCl repletion   3. Chronic Afib with RVR s/p DCCV last admission: -Her heart rate has been difficult to control and is about as well controlled as she is going to get at this time -She did not maintain sinus rhythm s/p recent DCCV, thus the chance of her maintaining sinus rhythm is quite low -Will need to get her breathing somewhat better prior to EP evaluation for PPM placement -PPM placement may need to be done as an outpatient  -Continue Lopressor 25 mg  q 6 hours and amiodarone infusion   -Transition to PO amiodarone 400 mg bid today to assess how her heart rate will do with this -If heart rate increases may need to re-bolus with amiodarone over slow period given slightly soft BP -BP precludes titration of BB at this time -Xarelto held given her anemia -CHADS2VASc at least 7 (CHF, HTN, age x 2, DM, vascular disease, sex category)  4. CAD: -No symptoms concerning for angina -Continue current medical therapy   5. Acute anemia: -Oncology on board -Planning for IV iron infusion today -Likely playing a role in her SOB as well -Will need to hold Xarelto given the drop in her HGB  6. Acute on CKD stage III: -Likely in the setting of her anemia  -Restart  IV Lasix this morning with KCl repletion  -Bmet pending   Signed, Marcille Blanco [place] Pager: [phone] 03/22/2016, 8:50 AM   I have seen, examined and evaluated the patient this AM along with Mr.Dunn on Am rounds.  After reviewing all the available data and chart, we discussed the patients laboratory, study & physical findings as well as symptoms in detail. I agree with his findings, examination as well as impression recommendations as per our discussion.    Slowly progressing, rate is better controlled now. I think wearing K switching to oral amiodarone. Anemia being monitored and managed by the medicine service and oncology. Currently holding Xarelto based on the drop her hemoglobin, although this been no sign of acute bleeding. I think we can probably restart Lasix by mouth by tomorrow. Need to monitor potassium.  Despite all this, the overriding factor is the continued A. Fib with RVR that is unable to be managed him with amiodarone. This is exacerbating her cardiac function with now having most likely tachycardia induced cardiomyopathy. In the setting of her existing COPD is essentially making her too sick to recover enough strength to safely be discharged. Currently, we need to make a plan as to whether not related to proceed with AV nodal ablation plus pacemaker placement or not. This needs to be arranged or decided prior to discharge, if not we need to consider moving to [facility] with a left physiologist of force to make decision. I don't think we can continue to wait another week.  I had a long talk with the patient today about potential treatment options and goals of care. If we are not able to proceed with ablation, and I think we probably need to move toward the concept of palliative care. We have asked to consult the palliative care to see the patient. They are familiar with her asked about her.   Greater than 50% was spent in counseling and coordination  of care with patient Total encounter time 35 minutes or more   Trenyce Loera W, M.D., M.S.  [employer]  [address] [place], [place] [address] [phone] Fax [phone]

## 2016-03-22 NOTE — Progress Notes (Signed)
Patient was placed on CPAP machine by RT '@2030'$  per patient and family request. She denied pain, but expressed some difficulty in breathing. She remained on amio drip overnight d/t afib. Patient was assisted as needed .  0130: CPAP machine stopped on patient's request. She stated that she could not tolerate the CPAP. 0400: Patient requested for the CPAP order be changed to Bipap. Dr Estanislado Pandy was contacted by the respiratory therapist. Patient was placed on Bipap per order.  She rested for the rest of the night.

## 2016-03-22 NOTE — Progress Notes (Signed)
Patient refuses to wear CPAP. Patient given a breathing treatment for shortness of breath, despite Vitals signs within normal limits.

## 2016-03-22 NOTE — Progress Notes (Signed)
Patient BP 98/63 before 1400 metop. Per Dr. Posey Pronto hold this dose and resume at next dose. Regina Burke

## 2016-03-22 NOTE — Telephone Encounter (Signed)
Will talk with DR about BiPAP and will proceed after that.

## 2016-03-23 ENCOUNTER — Inpatient Hospital Stay: Payer: Medicare Other | Admitting: Internal Medicine

## 2016-03-23 DIAGNOSIS — J9690 Respiratory failure, unspecified, unspecified whether with hypoxia or hypercapnia: Secondary | ICD-10-CM

## 2016-03-23 LAB — CBC
HCT: 27.9 % — ABNORMAL LOW (ref 35.0–47.0)
Hemoglobin: 9.3 g/dL — ABNORMAL LOW (ref 12.0–16.0)
MCH: 26.4 pg (ref 26.0–34.0)
MCHC: 33.3 g/dL (ref 32.0–36.0)
MCV: 79.4 fL — AB (ref 80.0–100.0)
PLATELETS: 182 10*3/uL (ref 150–440)
RBC: 3.51 MIL/uL — ABNORMAL LOW (ref 3.80–5.20)
RDW: 17.7 % — AB (ref 11.5–14.5)
WBC: 8.5 10*3/uL (ref 3.6–11.0)

## 2016-03-23 LAB — BASIC METABOLIC PANEL
Anion gap: 11 (ref 5–15)
BUN: 63 mg/dL — AB (ref 6–20)
CALCIUM: 8.7 mg/dL — AB (ref 8.9–10.3)
CO2: 31 mmol/L (ref 22–32)
Chloride: 97 mmol/L — ABNORMAL LOW (ref 101–111)
Creatinine, Ser: 3.25 mg/dL — ABNORMAL HIGH (ref 0.44–1.00)
GFR calc Af Amer: 14 mL/min — ABNORMAL LOW (ref >60)
GFR, EST NON AFRICAN AMERICAN: 12 mL/min — AB (ref >60)
GLUCOSE: 161 mg/dL — AB (ref 65–99)
Potassium: 4.2 mmol/L (ref 3.5–5.1)
SODIUM: 139 mmol/L (ref 135–145)

## 2016-03-23 LAB — GLUCOSE, CAPILLARY
GLUCOSE-CAPILLARY: 159 mg/dL — AB (ref 65–99)
Glucose-Capillary: 111 mg/dL — ABNORMAL HIGH (ref 65–99)
Glucose-Capillary: 112 mg/dL — ABNORMAL HIGH (ref 65–99)
Glucose-Capillary: 134 mg/dL — ABNORMAL HIGH (ref 65–99)

## 2016-03-23 LAB — ERYTHROPOIETIN: Erythropoietin: 39.3 m[IU]/mL — ABNORMAL HIGH (ref 2.6–18.5)

## 2016-03-23 LAB — HAPTOGLOBIN: Haptoglobin: 164 mg/dL (ref 34–200)

## 2016-03-23 MED ORDER — SODIUM CHLORIDE 0.9% FLUSH: 10.0000 mL | INTRAVENOUS | Status: DC | PRN

## 2016-03-23 MED ORDER — RIVAROXABAN 15 MG PO TABS
15.0000 mg | ORAL_TABLET | Freq: Every day | ORAL | Status: DC
  Administered 2016-03-23 – 2016-03-26 (×4): 15 mg via ORAL
  Filled 2016-03-23 (×4): qty 1

## 2016-03-23 MED ORDER — RIVAROXABAN 15 MG PO TABS: 15.0000 mg | ORAL_TABLET | Freq: Every day | ORAL | Status: DC

## 2016-03-23 MED ORDER — FUROSEMIDE 20 MG PO TABS
20.0000 mg | ORAL_TABLET | Freq: Every day | ORAL | Status: DC
  Administered 2016-03-23 – 2016-03-24 (×2): 20 mg via ORAL
  Filled 2016-03-23 (×2): qty 1

## 2016-03-23 MED ORDER — METOPROLOL TARTRATE 50 MG PO TABS
50.0000 mg | ORAL_TABLET | Freq: Two times a day (BID) | ORAL | Status: DC
  Administered 2016-03-23 – 2016-03-30 (×15): 50 mg via ORAL
  Filled 2016-03-23 (×15): qty 1

## 2016-03-23 MED ORDER — SODIUM CHLORIDE 0.9% FLUSH
10.0000 mL | Freq: Two times a day (BID) | INTRAVENOUS | Status: DC
  Administered 2016-03-23: 30 mL
  Administered 2016-03-24 – 2016-03-25 (×4): 10 mL
  Administered 2016-03-26: 30 mL
  Administered 2016-03-26 – 2016-03-27 (×2): 10 mL
  Administered 2016-03-28: 20 mL
  Administered 2016-03-28: 10 mL
  Administered 2016-03-29: 30 mL
  Administered 2016-03-29: 10 mL
  Administered 2016-03-30: 30 mL

## 2016-03-23 NOTE — Progress Notes (Signed)
Patient: Regina Burke / Admit Date: 03/16/2016 / Date of Encounter: 03/23/2016, 6:18 PM   Subjective: Discussed case on rounds, and talked with Dr. Posey Pronto, medicine service Patient reports that she feels somewhat better this morning, Denies any significant shortness of breath at rest She does feel weak, has not been exerting herself Remains on nasal cannula oxygen Heart rate on telemetry 90-100, on amiodarone orally, infusion has been stopped Also on metoprolol 50 mill grams twice a day Discussed placement at rehabilitation  Review of Systems: Review of Systems  Constitutional: Positive for malaise/fatigue.  Respiratory: Positive for shortness of breath.   Cardiovascular: Negative.   Gastrointestinal: Negative.   Musculoskeletal: Negative.   Neurological: Positive for weakness.  Psychiatric/Behavioral: Negative.   All other systems reviewed and are negative.   Objective: Telemetry:  Physical Exam: Blood pressure (!) 125/59, pulse (!) 110, temperature 97.8 F (36.6 C), temperature source Oral, resp. rate 20, height '5\' 5"'$  (1.651 m), weight 193 lb 1.6 oz (87.6 kg), SpO2 94 %. Body mass index is 32.13 kg/m. General: Well developed, well nourished, in no acute distress. Head: Normocephalic, atraumatic Neck: Negative for carotid bruits. JVP not elevated. Lungs: Decreased breath sounds bilaterally with diffuse bilateral crackles.  Heart: Irregularly-irregular S1 S2 without murmurs, rubs, or gallops.  Abdomen: Soft, non-tender, non-distended with normoactive bowel sounds. No rebound/guarding. Extremities: No clubbing or cyanosis. No edema. Distal pedal pulses are 2+ and equal bilaterally. Neuro: Alert and oriented X 3. Moves all extremities spontaneously. Psych:  Responds to questions appropriately with a normal affect.   Intake/Output Summary (Last 24 hours) at 03/23/16 1818 Last data filed at 03/23/16 1300  Gross per 24 hour  Intake              720 ml  Output               170 ml  Net              550 ml    Inpatient Medications:  . amiodarone  400 mg Oral BID  . ferrous sulfate  325 mg Oral Q breakfast  . fluticasone furoate-vilanterol  1 puff Inhalation Daily  . furosemide  20 mg Oral Daily  . guaiFENesin  600 mg Oral BID  . insulin aspart  0-15 Units Subcutaneous TID WC  . insulin aspart  0-5 Units Subcutaneous QHS  . metoprolol tartrate  50 mg Oral BID  . pantoprazole  40 mg Oral Q1200  . potassium chloride  20 mEq Oral Once  . potassium chloride  20 mEq Oral BID  . rivaroxaban  15 mg Oral Q supper  . sertraline  50 mg Oral Daily  . tiotropium  18 mcg Inhalation Daily   Infusions:    Labs:  Recent Labs  03/22/16 0404 03/23/16 1000  NA 138 139  K 3.7 4.2  CL 95* 97*  CO2 31 31  GLUCOSE 150* 161*  BUN 51* 63*  CREATININE 2.52* 3.25*  CALCIUM 8.3* 8.7*   No results for input(s): AST, ALT, ALKPHOS, BILITOT, PROT, ALBUMIN in the last 72 hours.  Recent Labs  03/21/16 0508 03/23/16 1000  WBC 5.6 8.5  NEUTROABS 4.4  --   HGB 8.3* 9.3*  HCT 24.8* 27.9*  MCV 80.3 79.4*  PLT 140* 182   No results for input(s): CKTOTAL, CKMB, TROPONINI in the last 72 hours. Invalid input(s): POCBNP No results for input(s): HGBA1C in the last 72 hours.   Weights: [employer]   03/21/16  6283 03/22/16 0342 03/23/16 0449  Weight: 190 lb 1.6 oz (86.2 kg) 192 lb 4.8 oz (87.2 kg) 193 lb 1.6 oz (87.6 kg)     Radiology/Studies:  Dg Chest 1 View  Result Date: 03/04/2016 CLINICAL DATA:  Respiratory distress EXAM: CHEST 1 VIEW COMPARISON:  Chest radiograph dated 01/24/2016. CT chest dated 01/21/2016. FINDINGS: Patchy bilateral lower lobe opacities, suspicious for pneumonia, less likely atelectasis. Superimposed mild interstitial edema is suspected. Small bilateral pleural effusions. No pneumothorax. Again noted is a stable focal opacity in the medial right upper lobe, most of which likely reflects radiation changes when correlating with prior CT,  underlying residual/recurrent tumor not excluded. Cardiomegaly. IMPRESSION: Patchy bilateral lower lobe opacities, suspicious for pneumonia, less likely atelectasis. Superimposed mild interstitial edema is suspected. Small bilateral pleural effusions. Stable focal opacity in the medial right upper lobe, most of which likely reflects radiation changes when correlating with prior CT, underlying residual/recurrent tumor not excluded. Electronically Signed   By: Julian Hy M.D.   On: 03/04/2016 21:22   Dg Abd 1 View  Result Date: 03/16/2016 CLINICAL DATA:  Evaluate OG tube EXAM: ABDOMEN - 1 VIEW COMPARISON:  None. FINDINGS: The side port of the OG tube is near the GE junction. The distal tip is in the stomach. See the chest x-ray report from the same time for description of the lung bases. No other acute abnormalities. IMPRESSION: The side port of the OG tube is near the GE junction. Consider advancement of the tube further into the stomach. Electronically Signed   By: Dorise Bullion III M.D   On: 03/16/2016 18:28   Ct Chest Wo Contrast  Result Date: 03/07/2016 CLINICAL DATA:  History of COPD with respiratory distress for the past 3 days EXAM: CT CHEST WITHOUT CONTRAST TECHNIQUE: Multidetector CT imaging of the chest was performed following the standard protocol without IV contrast. COMPARISON:  03/04/2016, 01/21/2016 FINDINGS: Cardiovascular: Calcifications of the thoracic aorta are noted without aneurysmal dilatation. The cardiac structures show coronary calcifications. Mediastinum/Nodes: The thoracic inlet demonstrates some nodularity within the right lobe of the thyroid. This is stable from the prior exam. No significant hilar or mediastinal adenopathy is noted. Lungs/Pleura: Bilateral pleural effusions are seen left slightly greater than right. Some associated lower lobe consolidation is noted again left greater than right. These changes are relatively stable from the prior exam. The ground-glass  density seen in the left upper lobe is again identified and has improved somewhat in the interval from the prior exam. Scarring in the right upper lobe is again noted stable from the prior exam as well. No new focal infiltrate is seen. Upper Abdomen: Stable cystic lesions are again seen in the spleen. Right adrenal lesion is again noted and stable. Musculoskeletal: No acute bony abnormality noted. IMPRESSION: Bilateral pleural effusions and associated lower lobe consolidation stable from the prior study. Stable changes in the right upper lobe similar to that seen on the recent exam. Slight improvement in the ground-glass density within the left upper lobe. Electronically Signed   By: Inez Catalina M.D.   On: 03/07/2016 16:36   Dg Chest Port 1 View  Result Date: 03/20/2016 CLINICAL DATA:  Respiratory failure. EXAM: PORTABLE CHEST 1 VIEW COMPARISON:  03/19/2016.  01/23/2016. FINDINGS: Left IJ line in stable position. Cardiomegaly with progressive bilateral pulmonary interstitial prominence and bilateral pleural effusions consistent with progressive congestive heart failure. Persistent density in the right upper lobe without significant interim change and most likely related to radiation change. Surgical clips right  upper chest. IMPRESSION: 1.  Left IJ line stable position. 2. Congestive heart failure with bilateral pulmonary interstitial edema and pleural effusions. 3. Persistent density in the right upper lobe most consistent with scarring and/or radiation change. Electronically Signed   By: Marcello Moores  Register   On: 03/20/2016 07:11  Dg Chest Port 1 View  Result Date: 03/19/2016 CLINICAL DATA:  Respiratory failure EXAM: PORTABLE CHEST 1 VIEW COMPARISON:  03/18/2016 FINDINGS: Cardiomegaly. No frank interstitial edema. Moderate left and small right pleural effusions. Associated bilateral lower lobe opacities, likely atelectasis. Stable platelike opacity in the right upper lobe, presumably radiation changes. Left  IJ venous catheter terminates in the lower SVC. IMPRESSION: Moderate left and small right pleural effusions. No frank interstitial edema. Electronically Signed   By: Julian Hy M.D.   On: 03/19/2016 11:44  Dg Chest Port 1 View  Result Date: 03/18/2016 CLINICAL DATA:  Respiratory failure. EXAM: PORTABLE CHEST 1 VIEW COMPARISON:  03/17/2016 and CT chest 03/07/2016, 01/21/2016. FINDINGS: Interval extubation. Nasogastric tube is been removed as well. Left IJ central line tip projects over the SVC. Basilar dependent airspace opacification with left lower lobe collapse/ consolidation. Small right pleural effusion and moderate left pleural effusion. Oblong opacity in the apex of the right hemi thorax is seen with fiducial markers, stable. IMPRESSION: 1. Basilar dependent airspace opacification with bilateral pleural effusions. Findings may be due to congestive heart failure. 2. Left lower lobe collapse/consolidation. 3. Oblong opacity with fiducial markers in the right upper lobe, better evaluated on cross-sectional imaging 03/07/2016 and 01/21/2016. Electronically Signed   By: Lorin Picket M.D.   On: 03/18/2016 07:59   Dg Chest Port 1 View  Result Date: 03/17/2016 CLINICAL DATA:  Acute respiratory failure. EXAM: PORTABLE CHEST 1 VIEW COMPARISON:  Chest radiograph March 16, 2016 FINDINGS: Chest radiograph is enlarged, similar to prior imaging. Calcified aortic knob. Similar pulmonary vascular congestion with increasing RIGHT, similar LEFT small to moderate pleural effusions. Interstitial prominence. Bibasilar consolidation. Similar fullness of the hilum. No pneumothorax. Endotracheal tube tip projects 2.5 cm above the carina. Nasogastric tube past proximal stomach, distal tip not imaged. LEFT internal jugular central venous catheter distal tip projects in mid superior vena cava. Soft tissue planes and included osseous structures are nonsuspicious. Surgical clips project in LEFT upper abdomen. IMPRESSION:  Stable cardiomegaly and pulmonary edema with increasing RIGHT, similar LEFT small to moderate pleural effusions. Underlying consolidation. No apparent change in life-support lines. Electronically Signed   By: Elon Alas M.D.   On: 03/17/2016 04:38   Dg Chest Port 1 View  Result Date: 03/16/2016 CLINICAL DATA:  Line placement EXAM: PORTABLE CHEST 1 VIEW COMPARISON:  March 16, 2016 FINDINGS: The ETT is in good position. A new left central line terminates in mid SVC. No pneumothorax is identified. Opacity in the medial right upper lung is similar in the interval. The left-sided pleural effusion with underlying opacity is stable. The effusion and opacity in the right base has improved. No change in cardiomegaly. No other interval changes. IMPRESSION: A new left IJ is identified in good position with no pneumothorax. The left-sided effusion and opacity is stable while the right-sided effusion and opacity has improved. Electronically Signed   By: Dorise Bullion III M.D   On: 03/16/2016 18:28   Dg Chest Port 1 View  Result Date: 03/16/2016 CLINICAL DATA:  Status post intubation and NG tube placement. Shortness of breath. EXAM: PORTABLE CHEST 1 VIEW COMPARISON:  Single-view of the chest earlier today. FINDINGS: NG tube is  seen with the side port at the gastroesophageal junction and tip just within the stomach. The tube should be advanced approximately 5 cm for better positioning. New endotracheal tube is in place with the tip in good position at the level of the clavicular heads. Bilateral effusions and airspace disease persist without marked change since the study earlier today. IMPRESSION: ET tube in good position. NG tube should be advanced approximately 5 cm for better positioning. Bilateral pleural effusions and basilar airspace disease. Electronically Signed   By: Inge Rise M.D.   On: 03/16/2016 15:52   Dg Chest Port 1 View  Result Date: 03/16/2016 CLINICAL DATA:  Difficulty breathing today.  EXAM: PORTABLE CHEST 1 VIEW COMPARISON:  Single-view of the chest 03/10/2016 and 03/04/2016. CT chest 03/07/2016. FINDINGS: Moderate bilateral pleural effusions have increased in size since the most recent examination. Associated basilar atelectasis is noted. There is cardiomegaly and mild interstitial edema. IMPRESSION: Increased moderate bilateral pleural effusions and basilar atelectasis. Cardiomegaly and mild interstitial edema. Electronically Signed   By: Inge Rise M.D.   On: 03/16/2016 14:38   Dg Chest Port 1 View  Result Date: 03/10/2016 CLINICAL DATA:  Acute respiratory failure, asthma -COPD, CHF, coronary artery disease EXAM: PORTABLE CHEST 1 VIEW COMPARISON:  Portable chest x-ray of March 04, 2016 and chest CT scan of March 07, 2016 FINDINGS: The lungs are well-expanded. There remain bilateral pleural effusions which have increased slightly in volume. The pulmonary vascularity is less engorged. The interstitial markings are less prominent. The cardiac silhouette remains enlarged. There is calcification in the wall of the aortic arch. IMPRESSION: Mild interval improvement in pulmonary interstitial edema and bilateral pleural effusions. Persistent left basilar atelectasis or pneumonia. Electronically Signed   By: David  Martinique M.D.   On: 03/10/2016 07:51     Assessment and Plan  82 y.o. female   1. Acute on chronic respiratory failure with hypoxia and hypercapnia: -Multifactorial including COPD exacerbation and acute on chronic combined CHF, exacerbated by atrial fibrillation -Nighttime BiPAP Medications as below  2. Acute on chronic combined CHF:  EF of 35-40%,  tachycardia-induced cardiomyopathy  Given climbing creatinine, changed to oral Lasix  3. Chronic Afib with RVR s/p DCCV last admission: difficult to control and is about as well controlled as we will likely be able to obtain  -She did not maintain sinus rhythm s/p recent DCCV, thus the chance of her maintaining sinus  rhythm is quite low We'll recommend outpatient EP clinic follow-up amiodarone 400 mg bid with metoprolol 50 mg twice a day Would restart anticoagulation -CHADS2VASc at least 7 (CHF, HTN, age x 2, DM, vascular disease, sex category)  4. CAD: -No symptoms concerning for angina -Continue current medical therapy   5. Acute anemia: Scheduled to see hematology as an outpatient As no blood loss anemia, could restart anticoagulation  6. Acute on CKD stage III: Secondary to overdiuresis Lasix changed from IV to oral   Discussed case with Dr. Posey Pronto, case management  Total encounter time more than 35 minutes  Greater than 50% was spent in counseling and coordination of care with the patient   Signed, Esmond Plants, MD, Ph.D [facility]

## 2016-03-23 NOTE — Progress Notes (Signed)
PT Cancellation Note  Patient Details Name: Regina Burke MRN: [id number] DOB: 06/16/1933   Cancelled Treatment:    Reason Eval/Treat Not Completed: Patient declined, no reason specified. Treatment attempted; pt refuses again today, noting she is too short of breath. Pt states she is going to rehab tomorrow and will get plenty of exercise at that time. Attempted to encourage pt to participate in some exercise/activity daily to prevent increasing weakness, but pt continues to refuse. Re attempt at a later time/date during admission to progress strength and activity tolerance for improved functional mobility.    Charlaine Dalton, [place] 03/23/2016, 11:54 AM

## 2016-03-23 NOTE — Care Management (Signed)
Barrier to discharge: continue to diurese.  IV lasix 40 mg changed to lasix 20 mg po Qd due to rising creatinine. It is anticipated that patient will discharge to [facility] 07/28.

## 2016-03-23 NOTE — Progress Notes (Signed)
[place] [facility] [place] NAME: Regina Burke    MR#:  [id number]  DATE OF BIRTH:  03/11/1933  SUBJECTIVE:  Slept good last nite. Feels a lot better  REVIEW OF SYSTEMS:   Review of Systems  Constitutional: Negative for chills, fever and weight loss.  HENT: Negative for ear discharge, ear pain and nosebleeds.   Eyes: Negative for blurred vision, pain and discharge.  Respiratory: Positive for shortness of breath. Negative for sputum production, wheezing and stridor.   Cardiovascular: Negative for chest pain, palpitations, orthopnea and PND.  Gastrointestinal: Negative for abdominal pain, diarrhea, nausea and vomiting.  Genitourinary: Negative for frequency and urgency.  Musculoskeletal: Negative for back pain and joint pain.  Neurological: Negative for sensory change, speech change, focal weakness and weakness.  Psychiatric/Behavioral: Negative for depression and hallucinations. The patient is not nervous/anxious.   All other systems reviewed and are negative.  Tolerating Diet:yes Tolerating PT: SNF  DRUG ALLERGIES:   Allergies  Allergen Reactions  . Ciprofloxacin Shortness Of Breath, Itching and Rash  . Doxycycline Shortness Of Breath, Itching and Rash  . Penicillins Shortness Of Breath, Itching, Rash and Other (See Comments)    Has patient had a PCN reaction causing immediate rash, facial/tongue/throat swelling, SOB or lightheadedness with hypotension: Yes Has patient had a PCN reaction causing severe rash involving mucus membranes or skin necrosis: No Has patient had a PCN reaction that required hospitalization No Has patient had a PCN reaction occurring within the last 10 years: No If all of the above answers are "NO", then may proceed with Cephalosporin use.  . Sulfa Antibiotics Shortness Of Breath, Itching and Rash  . Morphine And Related Itching  . Albuterol Sulfate   . Cefuroxime Rash    blisters    VITALS:  Blood  pressure (!) 113/59, pulse (!) 106, temperature 97.7 F (36.5 C), temperature source Oral, resp. rate (!) 22, height '5\' 5"'$  (1.651 m), weight 87.6 kg (193 lb 1.6 oz), SpO2 97 %.  PHYSICAL EXAMINATION:   Physical Exam  GENERAL:  82 y.o.-year-old patient lying in the bed with no acute distress. obese EYES: Pupils equal, round, reactive to light and accommodation. No scleral icterus. Extraocular muscles intact.  HEENT: Head atraumatic, normocephalic. Oropharynx and nasopharynx clear.  NECK:  Supple, no jugular venous distention. No thyroid enlargement, no tenderness.  LUNGS: distant breath sounds bilaterally, no wheezing, no rales, rhonchi. No use of accessory muscles of respiration.  CARDIOVASCULAR: S1, S2 normal. No murmurs, rubs, or gallops.  ABDOMEN: Soft, nontender, nondistended. Bowel sounds present. No organomegaly or mass.  EXTREMITIES: No cyanosis, clubbing  mild edema b/l.    NEUROLOGIC: Cranial nerves II through XII are intact. No focal Motor or sensory deficits b/l.   PSYCHIATRIC:  patient is alert and oriented x 3.  SKIN: No obvious rash, lesion, or ulcer.   LABORATORY PANEL:  CBC  Recent Labs Lab 03/21/16 0508  WBC 5.6  HGB 8.3*  HCT 24.8*  PLT 140*    Chemistries   Recent Labs Lab 03/17/16 0500  03/18/16 0524  03/22/16 0404  NA  --   < > 141  < > 138  K  --   < > 4.1  < > 3.7  CL  --   < > 102  < > 95*  CO2  --   < > 32  < > 31  GLUCOSE  --   < > 107*  < > 150*  BUN  --   < >  56*  < > 51*  CREATININE  --   < > 2.06*  < > 2.52*  CALCIUM  --   < > 8.1*  < > 8.3*  MG 2.1  --   --   --   --   AST  --   --  29  --   --   ALT  --   --  69*  --   --   ALKPHOS  --   --  47  --   --   BILITOT  --   --  1.1  --   --   < > = values in this interval not displayed. Cardiac Enzymes  Recent Labs Lab 03/16/16 1419  TROPONINI <0.03   RADIOLOGY:  No results found. ASSESSMENT AND PLAN:  82 yo female with a PMH of Type II diabetes, anemia, GERD, cushing's  disease, asthma, CKD stage III, pneumonia, edema, paroxysmal atrial fibrillation, HTN, lung cancer s/p radiation 2015, CAD, NSTEMI, HTN, diverticulitis, and gout.  She presented to [facility] ER via EMS on 7/20 with c/o worsening shortness of breath. She was recently admitted to the hospital on 03/04/16 and discharged 03/14/16 for CHF exacerbation and Afibb/RVR and was discharged on her normal supplemental oxygen of 3L.  Per EMS they found her on 3L O2 at home with O2 sats 91% and somnolent  1. Acute on chronic respiratory failure with hypoxia and hypercapnia: -Multifactorial including COPD exacerbation and acute on chronic combined CHF -Continue diuresis but decreased dose of lasix to 20 mg po dialy due to increasing creatinine -Nighttime BiPAP prn -on 3 liter [place] oxygen  2. Acute on chronic combined CHF: -Echo with  LV systolic function with an EF of 35-40%.  -appreciate cardiology consult with Dr Rockey Situ  3. Chronic Afib with RVR s/p DCCV last admission: -Her heart rate has been difficult to control -She did not maintain sinus rhythm s/p recent DCCV, thus the chance of her maintaining sinus rhythm is quite low -possible need for PPM for better HR contro -?EP to see [facility] -Continue Lopressor 25 mg q 6 hours and amiodarone infusion---changed to oral amiodarone and bid metoprolol  -On Xarelto, though held  given her acute anemia with a drop in HGB 2.5 g/dL over the past 5 days  4. CAD: -No symptoms concerning for angina -Continue current medical therapy   5. Acute on chronic anemia: -HGB has dropped 2.5 g/dL over the past 5 days with a stable PLT count -Held her Xarelto given the drop in her HGB but will resume at d/c since no active bleed and she is at high risk for thomboembolism given chronic afib. D/w cardiology -Iron studies show IDA Will resume po ferrous sulfate and given one dose of IV iron per Dr Grayland Ormond on July 26th -pt will f/u closely at the cancer center for IDA -she is not  medically stable for any GI w/u and no active bleeding noted  6. Acute on CKD stage III: -Renal function slightly increased this morning possibly from her anemia  -Hold Lasix for this morning and resume from am (20 mg )  &/ D/c planning to SNF on Friday Pt agreeable  Case discussed with Care Management/Social Worker. Management plans discussed with the patient, family and they are in agreement.  CODE STATUS: DNR  DVT Prophylaxis: SCD TOTAL TIME TAKING CARE OF THIS PATIENT: 30 minutes.  >50% time spent on counselling and coordination of care  POSSIBLE D/C IN 1-2 DAYS, DEPENDING ON CLINICAL  CONDITION.  Note: This dictation was prepared with Dragon dictation along with smaller phrase technology. Any transcriptional errors that result from this process are unintentional.  Ramiel Forti M.D on 03/23/2016 at 8:45 AM  Between 7am to 6pm - Pager - [phone]  After 6pm go to [email or web address] - password [email or web address]  Office  [phone]  CC: Primary care physician; Tracie Harrier, MD

## 2016-03-23 NOTE — Discharge Instructions (Signed)
[facility] Failure [facility] appointment on April 06, 2016 at 11:00am with Darylene Price, [place]. Please call [phone] to reschedule.   Information on my medicine - XARELTO (Rivaroxaban)  Why was Xarelto prescribed for you? Xarelto was prescribed for you to reduce the risk of a blood clot forming that can cause a stroke if you have a medical condition called atrial fibrillation (a type of irregular heartbeat).  What do you need to know about xarelto ? Take your Xarelto ONCE DAILY at the same time every day with your evening meal. If you have difficulty swallowing the tablet whole, you may crush it and mix in applesauce just prior to taking your dose.  Take Xarelto exactly as prescribed by your doctor and DO NOT stop taking Xarelto without talking to the doctor who prescribed the medication.  Stopping without other stroke prevention medication to take the place of Xarelto may increase your risk of developing a clot that causes a stroke.  Refill your prescription before you run out.  After discharge, you should have regular check-up appointments with your healthcare provider that is prescribing your Xarelto.  In the future your dose may need to be changed if your kidney function or weight changes by a significant amount.  What do you do if you miss a dose? If you are taking Xarelto ONCE DAILY and you miss a dose, take it as soon as you remember on the same day then continue your regularly scheduled once daily regimen the next day. Do not take two doses of Xarelto at the same time or on the same day.   Important Safety Information A possible side effect of Xarelto is bleeding. You should call your healthcare provider right away if you experience any of the following: ? Bleeding from an injury or your nose that does not stop. ? Unusual colored urine (red or dark brown) or unusual colored stools (red or black). ? Unusual bruising for unknown reasons. ? A serious fall or if you hit your head  (even if there is no bleeding).  Some medicines may interact with Xarelto and might increase your risk of bleeding while on Xarelto. To help avoid this, consult your healthcare provider or pharmacist prior to using any new prescription or non-prescription medications, including herbals, vitamins, non-steroidal anti-inflammatory drugs (NSAIDs) and supplements.  This website has more information on Xarelto: [email or web address]

## 2016-03-24 ENCOUNTER — Inpatient Hospital Stay: Payer: Medicare Other

## 2016-03-24 ENCOUNTER — Inpatient Hospital Stay (HOSPITAL_COMMUNITY)
Admit: 2016-03-24 | Discharge: 2016-03-24 | Disposition: A | Discharge status: Home / Self Care | Payer: Medicare Other | Attending: Cardiovascular Disease | Admitting: Cardiovascular Disease

## 2016-03-24 DIAGNOSIS — I360 Nonrheumatic tricuspid (valve) stenosis: Secondary | ICD-10-CM

## 2016-03-24 DIAGNOSIS — I2510 Atherosclerotic heart disease of native coronary artery without angina pectoris: Secondary | ICD-10-CM

## 2016-03-24 LAB — BASIC METABOLIC PANEL
ANION GAP: 10 (ref 5–15)
BUN: 67 mg/dL — ABNORMAL HIGH (ref 6–20)
CALCIUM: 8.5 mg/dL — AB (ref 8.9–10.3)
CO2: 30 mmol/L (ref 22–32)
Chloride: 97 mmol/L — ABNORMAL LOW (ref 101–111)
Creatinine, Ser: 3.51 mg/dL — ABNORMAL HIGH (ref 0.44–1.00)
GFR, EST AFRICAN AMERICAN: 13 mL/min — AB (ref >60)
GFR, EST NON AFRICAN AMERICAN: 11 mL/min — AB (ref >60)
GLUCOSE: 179 mg/dL — AB (ref 65–99)
Potassium: 4.5 mmol/L (ref 3.5–5.1)
Sodium: 137 mmol/L (ref 135–145)

## 2016-03-24 LAB — CBC
HCT: 27.8 % — ABNORMAL LOW (ref 35.0–47.0)
Hemoglobin: 9.3 g/dL — ABNORMAL LOW (ref 12.0–16.0)
MCH: 26.7 pg (ref 26.0–34.0)
MCHC: 33.3 g/dL (ref 32.0–36.0)
MCV: 80.3 fL (ref 80.0–100.0)
PLATELETS: 174 10*3/uL (ref 150–440)
RBC: 3.47 MIL/uL — ABNORMAL LOW (ref 3.80–5.20)
RDW: 17.6 % — AB (ref 11.5–14.5)
WBC: 8.5 10*3/uL (ref 3.6–11.0)

## 2016-03-24 LAB — GLUCOSE, CAPILLARY
GLUCOSE-CAPILLARY: 118 mg/dL — AB (ref 65–99)
GLUCOSE-CAPILLARY: 154 mg/dL — AB (ref 65–99)
Glucose-Capillary: 148 mg/dL — ABNORMAL HIGH (ref 65–99)
Glucose-Capillary: 125 mg/dL — ABNORMAL HIGH (ref 65–99)

## 2016-03-24 MED ORDER — LEVALBUTEROL HCL 1.25 MG/0.5ML IN NEBU: 1.2500 mg | INHALATION_SOLUTION | Freq: Four times a day (QID) | RESPIRATORY_TRACT | Status: DC

## 2016-03-24 MED ORDER — IPRATROPIUM BROMIDE 0.02 % IN SOLN: 0.5000 mg | RESPIRATORY_TRACT | 12 refills | Status: DC | PRN

## 2016-03-24 MED ORDER — ALPRAZOLAM 0.25 MG PO TABS: 0.2500 mg | ORAL_TABLET | Freq: Two times a day (BID) | ORAL | 0 refills | Status: DC | PRN

## 2016-03-24 MED ORDER — SERTRALINE HCL 50 MG PO TABS: 50.0000 mg | ORAL_TABLET | Freq: Every day | ORAL | 2 refills | Status: AC

## 2016-03-24 MED ORDER — POTASSIUM CHLORIDE CRYS ER 20 MEQ PO TBCR: 20.0000 meq | EXTENDED_RELEASE_TABLET | Freq: Every day | ORAL | 1 refills | Status: DC

## 2016-03-24 MED ORDER — PERFLUTREN LIPID MICROSPHERE
1.0000 mL | INTRAVENOUS | Status: AC | PRN
  Administered 2016-03-24: 2 mL via INTRAVENOUS
  Filled 2016-03-24: qty 10

## 2016-03-24 MED ORDER — FUROSEMIDE 10 MG/ML IJ SOLN
40.0000 mg | Freq: Once | INTRAMUSCULAR | Status: AC
  Administered 2016-03-24: 40 mg via INTRAVENOUS

## 2016-03-24 MED ORDER — GUAIFENESIN 100 MG/5ML PO SOLN: 10.0000 mL | ORAL | 0 refills | Status: DC | PRN

## 2016-03-24 NOTE — Progress Notes (Signed)
Notified Dr. Henrietta Dine of patient's increasing shortness of breath requiring longer periods of Bi-pap.

## 2016-03-24 NOTE — Progress Notes (Signed)
Pt. Refused the ABG. She does not appear to be in distress. Dr. Tressia Miners was called and I have a verbal order to cancel the ABG.

## 2016-03-24 NOTE — Progress Notes (Signed)
Pt requested Bipap. Pt is in moderate distress. Increased RR and HR. Pt placed on Bipap and states that she feels a little better. Will continue to monitor.

## 2016-03-24 NOTE — Progress Notes (Signed)
[facility] [place] NAME: Regina Burke    MR#:  [id number]  DATE OF BIRTH:  03/06/1933  SUBJECTIVE:  CHIEF COMPLAINT:   Chief Complaint  Patient presents with  . Shortness of Breath   - has been requiring on and off Bipap due to persistent shortness of breath. - creatinine worsening, CXR with worsened pleural effusions - not being discharged today due to above  REVIEW OF SYSTEMS:  Review of Systems  Constitutional: Negative for chills, fever and malaise/fatigue.  HENT: Negative for ear discharge, ear pain and nosebleeds.   Respiratory: Positive for shortness of breath. Negative for cough and wheezing.   Cardiovascular: Positive for palpitations and orthopnea. Negative for chest pain.  Gastrointestinal: Negative for abdominal pain, constipation, diarrhea, nausea and vomiting.  Genitourinary: Negative for dysuria and urgency.       Decreased urination  Musculoskeletal: Negative for myalgias.  Neurological: Negative for dizziness, speech change, focal weakness, seizures and headaches.  Psychiatric/Behavioral: The patient is nervous/anxious.     DRUG ALLERGIES:   Allergies  Allergen Reactions  . Ciprofloxacin Shortness Of Breath, Itching and Rash  . Doxycycline Shortness Of Breath, Itching and Rash  . Penicillins Shortness Of Breath, Itching, Rash and Other (See Comments)    Has patient had a PCN reaction causing immediate rash, facial/tongue/throat swelling, SOB or lightheadedness with hypotension: Yes Has patient had a PCN reaction causing severe rash involving mucus membranes or skin necrosis: No Has patient had a PCN reaction that required hospitalization No Has patient had a PCN reaction occurring within the last 10 years: No If all of the above answers are "NO", then may proceed with Cephalosporin use.  . Sulfa Antibiotics Shortness Of Breath, Itching and Rash  . Morphine And Related Itching  . Albuterol Sulfate   .  Cefuroxime Rash    blisters    VITALS:  Blood pressure 109/62, pulse 100, temperature 98.4 F (36.9 C), resp. rate 20, height '5\' 5"'$  (1.651 m), weight 87.1 kg (192 lb), SpO2 98 %.  PHYSICAL EXAMINATION:  Physical Exam  GENERAL:  82 y.o.-year-old obese patient lying in the bed, complaining of dyspnea, not tachypneic, Bipap on  EYES: Pupils equal, round, reactive to light and accommodation. No scleral icterus. Extraocular muscles intact.  HEENT: Head atraumatic, normocephalic. Oropharynx and nasopharynx clear.  NECK:  Supple, no jugular venous distention. No thyroid enlargement, no tenderness.  LUNGS: Normal breath sounds bilaterally, no wheezing, rhonchi or crepitation. No use of accessory muscles of respiration. Bibasilar crackles heard. CARDIOVASCULAR: S1, S2 normal. No murmurs, rubs, or gallops.  ABDOMEN: Soft, obese, nontender, nondistended. Bowel sounds present. No organomegaly or mass.  EXTREMITIES: No cyanosis, or clubbing. No pedal edema NEUROLOGIC: Cranial nerves II through XII are intact. Muscle strength 5/5 in all extremities. Sensation intact. Gait not checked. Global weakness PSYCHIATRIC: The patient is alert and oriented x 3.  SKIN: No obvious rash, lesion, or ulcer.    LABORATORY PANEL:   CBC  Recent Labs Lab 03/24/16 0940  WBC 8.5  HGB 9.3*  HCT 27.8*  PLT 174   ------------------------------------------------------------------------------------------------------------------  Chemistries   Recent Labs Lab 03/18/16 0524  03/24/16 1051  NA 141  < > 137  K 4.1  < > 4.5  CL 102  < > 97*  CO2 32  < > 30  GLUCOSE 107*  < > 179*  BUN 56*  < > 67*  CREATININE 2.06*  < > 3.51*  CALCIUM 8.1*  < >  8.5*  AST 29  --   --   ALT 69*  --   --   ALKPHOS 47  --   --   BILITOT 1.1  --   --   < > = values in this interval not displayed. ------------------------------------------------------------------------------------------------------------------  Cardiac  Enzymes No results for input(s): TROPONINI in the last 168 hours. ------------------------------------------------------------------------------------------------------------------  RADIOLOGY:  Dg Chest Port 1 View  Result Date: 03/24/2016 CLINICAL DATA:  Dyspnea.  History of asthma, COPD, lung cancer. EXAM: PORTABLE CHEST 1 VIEW COMPARISON:  03/20/2016 FINDINGS: Left internal jugular approach central venous catheter unchanged. Cardiomediastinal silhouette is mildly enlarged. Mediastinal contours appear intact. Calcific atherosclerotic disease of the aorta. There is no evidence of pneumothorax. There are persistent bilateral pleural effusions, slightly enlarged from the prior radiograph. Postsurgical/posttreatment changes in the right upper lobe are stable. Stable prominence of the right hilum. Osseous structures are without acute abnormality. Soft tissues are grossly normal. IMPRESSION: Slightly enlarged bilateral pleural effusions with bilateral lower lobe airspace consolidation versus atelectasis. Stable posttreatment changes in the right upper lobe. Stable prominence of the right hilum. Electronically Signed   By: Fidela Salisbury M.D.   On: 03/24/2016 11:27   EKG:   Orders placed or performed during the hospital encounter of 03/04/16  . EKG 12-Lead  . EKG 12-Lead  . EKG 12-Lead  . EKG 12-Lead  . EKG    ASSESSMENT AND PLAN:   82 yo female with a PMH of Type II diabetes, anemia, GERD, cushing's disease, asthma, CKD stage III, pneumonia, edema, paroxysmal atrial fibrillation, HTN, lung cancer s/p radiation 2015, CAD, NSTEMI, HTN, diverticulitis, and gout. She presented to [facility] ER via EMS on 7/20 with c/o worsening shortness of breath.   #1 Acute on chronic respiratory failure with hypoxemia and hypercapnia-secondary to acute on chronic combined CHF exacerbation. -Difficult to assess volume status at this time. Patient was aggressively diuresed on admission, now creatinine worsening but  chest x-ray still showing pleural effusions worse. -Discussed with cardiologist in detail, repeat echo ordered and also assessment of right heart pressures. -Extra dose of IV Lasix received this afternoon. Oral Lasix on hold. -Continue BiPAP as needed. On 3 L oxygen.  #2 COPD-stable at this time. Patient allergic to albuterol and Xopenex. -Continue Atrovent nebs. Not on systemic steroids. -Continue inhalers  #3 acute on chronic renal failure-creatinine much worse now. Received diuresis. -Hold nephrotoxins. Lasix held today. -Nephrology consulted for the same.  #4 chronic atrial fibrillation-with difficult to control heart rate. -Continue amiodarone orally. Also on metoprolol. -Had developed each digitoxin toxicity twice in the past, so no digoxin at this time. -EP referral as outpatient to see if she needs AV nodal ablation. -Failed DC cardioversion in the past -Xarelto restarted as hemoglobin is stable.  #5 acute on chronic anemia-significant iron deficiency and also anemia of chronic disease. -IV iron. Continue outpatient follow-up with cancer center. -No indication for transfusion at this time. Continue to monitor while on Xarelto  #6 diabetes mellitus-on sliding scale insulin  #7 DVT prophylaxis-on Xarelto   Will need to go to rehabilitation once breathing and renal function are better     All the records are reviewed and case discussed with Care Management/Social Workerr. Management plans discussed with the patient, family and they are in agreement.  CODE STATUS: DNR  TOTAL TIME TAKING CARE OF THIS PATIENT: 38 minutes.   POSSIBLE D/C IN 1-2 DAYS, DEPENDING ON CLINICAL CONDITION.   Gladstone Lighter M.D on 03/24/2016 at 3:13 PM  Between  7am to 6pm - Pager - [phone]  After 6pm go to [email or web address] - password [email or web address] [facility]  Office  [phone]  CC: Primary care physician; Tracie Harrier, MD

## 2016-03-24 NOTE — Plan of Care (Signed)
Problem: Pain Managment: Goal: General experience of comfort will improve Outcome: Not Progressing Increasing Shortness of breath  Problem: Activity: Goal: Risk for activity intolerance will decrease Outcome: Not Progressing Dyspnea at rest  Problem: Activity: Goal: Ability to tolerate increased activity will improve Outcome: Not Progressing dyspnea

## 2016-03-24 NOTE — Consult Note (Signed)
ANTICOAGULATION CONSULT NOTE - Follow Up Consult  Pharmacy Consult for xarelto Indication: atrial fibrillation  Allergies  Allergen Reactions  . Ciprofloxacin Shortness Of Breath, Itching and Rash  . Doxycycline Shortness Of Breath, Itching and Rash  . Penicillins Shortness Of Breath, Itching, Rash and Other (See Comments)    Has patient had a PCN reaction causing immediate rash, facial/tongue/throat swelling, SOB or lightheadedness with hypotension: Yes Has patient had a PCN reaction causing severe rash involving mucus membranes or skin necrosis: No Has patient had a PCN reaction that required hospitalization No Has patient had a PCN reaction occurring within the last 10 years: No If all of the above answers are "NO", then may proceed with Cephalosporin use.  . Sulfa Antibiotics Shortness Of Breath, Itching and Rash  . Morphine And Related Itching  . Albuterol Sulfate   . Cefuroxime Rash    blisters    Patient Measurements: Height: '5\' 5"'$  (165.1 cm) Weight: 192 lb (87.1 kg) IBW/kg (Calculated) : 57 Heparin Dosing Weight:   Vital Signs: Temp: 97.5 F (36.4 C) (07/28 0828) Temp Source: Oral (07/28 0828) BP: 109/57 (07/28 0828) Pulse Rate: 103 (07/28 0828)  Labs:  Recent Labs  03/22/16 0404 03/23/16 1000 03/24/16 0940 03/24/16 1051  HGB  --  9.3* 9.3*  --   HCT  --  27.9* 27.8*  --   PLT  --  182 174  --   CREATININE 2.52* 3.25*  --  3.51*    Estimated Creatinine Clearance: 13.9 mL/min (by C-G formula based on SCr of 3.51 mg/dL).   Medications:  Scheduled:  . amiodarone  400 mg Oral BID  . ferrous sulfate  325 mg Oral Q breakfast  . fluticasone furoate-vilanterol  1 puff Inhalation Daily  . furosemide  20 mg Oral Daily  . guaiFENesin  600 mg Oral BID  . insulin aspart  0-15 Units Subcutaneous TID WC  . insulin aspart  0-5 Units Subcutaneous QHS  . metoprolol tartrate  50 mg Oral BID  . pantoprazole  40 mg Oral Q1200  . potassium chloride  20 mEq Oral Once   . potassium chloride  20 mEq Oral BID  . rivaroxaban  15 mg Oral Q supper  . sertraline  50 mg Oral Daily  . sodium chloride flush  10-40 mL Intracatheter Q12H  . tiotropium  18 mcg Inhalation Daily    Assessment: Pt is an 82 year old female with Afib CHADS2VASc at least 7 (CHF, HTN, age x 2, DM, vascular disease, sex category). Pharmacy consulted to continue [place]. Pt has had a decline in renal function, however crcl using ABW (per protocol) remains above 31m/min  Goal of Therapy:   Monitor platelets by anticoagulation protocol: Yes   Plan:  Continue xarelto '15mg'$  daily at supper  MRamond Dial Pharm.D Clinical Pharmacist  03/24/2016,11:20 AM

## 2016-03-24 NOTE — Progress Notes (Signed)
Pt requesting to have BIPAP removed at this time.  Placed back on nasal cannula at 3L.  Reports itching to left arm.  Swelling noted at previous IV site to left antecubial.  Area cleanse with alcohol and cool cloth applied with relief at this time.  Will monitor.

## 2016-03-24 NOTE — Progress Notes (Signed)
Increased dyspnea.  Patient has head of bed elevated to highest point. Is leaning forward to breath.

## 2016-03-24 NOTE — Progress Notes (Signed)
Bipap again removed, unsure when it was restarted.

## 2016-03-24 NOTE — Progress Notes (Signed)
Has been on Bipap for the last 2 hours.  Requested that echocardiogram be performed at bedside.  The increased use of Bipap, and her respiratory changes may require that she be transferred to the ICU for near continuous Bipap use.  ABG was ordered but patient refused.

## 2016-03-24 NOTE — Care Management Important Message (Signed)
Important Message  Patient Details  Name: Regina Burke MRN: [id number] Date of Birth: November 12, 1932   Medicare Important Message Given:  Yes    Jolly Mango, RN 03/24/2016, 8:56 AM

## 2016-03-24 NOTE — Progress Notes (Signed)
Checked to see if pre-authorization would be needed for non-emergent EMS transport. Per [employer] benefits obtained online through [employer], patient has a [employer] Medicare Advantage PPO policy.  Medicare PPO plans do not require pre-auth for non-emergent ground transports using service codes A0426 or [id number].   

## 2016-03-24 NOTE — Progress Notes (Signed)
Bipap stopped. She's been on it since 0915

## 2016-03-24 NOTE — Progress Notes (Addendum)
Patient: Regina Burke / Admit Date: 03/16/2016 / Date of Encounter: 03/24/2016, 2:47 PM   Subjective: Talked with Dr. Tressia Miners, medicine service on rounds this morning Patient used her BiPAP at 4 AM and again this morning for shortness of breath  has not been exerting herself Heart rate on telemetry 90-100, on amiodarone orally on metoprolol 50 mill grams twice a day Was scheduled to go to rehabilitation today  Lab work reviewed, creatinine 3.51 up from her baseline creatinine of 2  Lasix has been held for now  Review of Systems: Review of Systems  Constitutional: Positive for malaise/fatigue.  Respiratory: Positive for shortness of breath.   Cardiovascular: Negative.   Gastrointestinal: Negative.   Musculoskeletal: Negative.   Neurological: Positive for weakness.  Psychiatric/Behavioral: Negative.   All other systems reviewed and are negative.   Objective: Telemetry: Atrial fibrillation with rate around 100 bpm Physical Exam: Blood pressure 109/62, pulse 100, temperature 98.4 F (36.9 C), resp. rate 20, height '5\' 5"'$  (1.651 m), weight 192 lb (87.1 kg), SpO2 98 %. Body mass index is 31.95 kg/m. General: Well developed, well nourished, mild distress, on BiPAP Head: Normocephalic, atraumatic Neck: Negative for carotid bruits. JVP not elevated. Lungs: Decreased breath sounds bilaterally with diffuse bilateral crackles.  Heart: Irregularly-irregular S1 S2 without murmurs, rubs, or gallops.  Abdomen: Soft, non-tender, non-distended with normoactive bowel sounds. No rebound/guarding. Extremities: No clubbing or cyanosis. No edema. Distal pedal pulses are 2+ and equal bilaterally. Neuro: Alert and oriented X 3. Moves all extremities spontaneously. Psych:  Responds to questions appropriately with a normal affect.   Intake/Output Summary (Last 24 hours) at 03/24/16 1447 Last data filed at 03/24/16 0900  Gross per 24 hour  Intake              510 ml  Output               200 ml  Net              310 ml    Inpatient Medications:  . amiodarone  400 mg Oral BID  . ferrous sulfate  325 mg Oral Q breakfast  . fluticasone furoate-vilanterol  1 puff Inhalation Daily  . furosemide  20 mg Oral Daily  . guaiFENesin  600 mg Oral BID  . insulin aspart  0-15 Units Subcutaneous TID WC  . insulin aspart  0-5 Units Subcutaneous QHS  . metoprolol tartrate  50 mg Oral BID  . pantoprazole  40 mg Oral Q1200  . potassium chloride  20 mEq Oral Once  . potassium chloride  20 mEq Oral BID  . rivaroxaban  15 mg Oral Q supper  . sertraline  50 mg Oral Daily  . sodium chloride flush  10-40 mL Intracatheter Q12H  . tiotropium  18 mcg Inhalation Daily   Infusions:    Labs:  Recent Labs  03/23/16 1000 03/24/16 1051  NA 139 137  K 4.2 4.5  CL 97* 97*  CO2 31 30  GLUCOSE 161* 179*  BUN 63* 67*  CREATININE 3.25* 3.51*  CALCIUM 8.7* 8.5*   No results for input(s): AST, ALT, ALKPHOS, BILITOT, PROT, ALBUMIN in the last 72 hours.  Recent Labs  03/23/16 1000 03/24/16 0940  WBC 8.5 8.5  HGB 9.3* 9.3*  HCT 27.9* 27.8*  MCV 79.4* 80.3  PLT 182 174   No results for input(s): CKTOTAL, CKMB, TROPONINI in the last 72 hours. Invalid input(s): POCBNP No results for input(s): HGBA1C in the last  72 hours.   Weights: Filed Weights   03/22/16 0342 03/23/16 0449 03/24/16 0551  Weight: 192 lb 4.8 oz (87.2 kg) 193 lb 1.6 oz (87.6 kg) 192 lb (87.1 kg)     Radiology/Studies:  Dg Chest 1 View  Result Date: 03/04/2016 CLINICAL DATA:  Respiratory distress EXAM: CHEST 1 VIEW COMPARISON:  Chest radiograph dated 01/24/2016. CT chest dated 01/21/2016. FINDINGS: Patchy bilateral lower lobe opacities, suspicious for pneumonia, less likely atelectasis. Superimposed mild interstitial edema is suspected. Small bilateral pleural effusions. No pneumothorax. Again noted is a stable focal opacity in the medial right upper lobe, most of which likely reflects radiation changes when correlating  with prior CT, underlying residual/recurrent tumor not excluded. Cardiomegaly. IMPRESSION: Patchy bilateral lower lobe opacities, suspicious for pneumonia, less likely atelectasis. Superimposed mild interstitial edema is suspected. Small bilateral pleural effusions. Stable focal opacity in the medial right upper lobe, most of which likely reflects radiation changes when correlating with prior CT, underlying residual/recurrent tumor not excluded. Electronically Signed   By: Julian Hy M.D.   On: 03/04/2016 21:22   Dg Abd 1 View  Result Date: 03/16/2016 CLINICAL DATA:  Evaluate OG tube EXAM: ABDOMEN - 1 VIEW COMPARISON:  None. FINDINGS: The side port of the OG tube is near the GE junction. The distal tip is in the stomach. See the chest x-ray report from the same time for description of the lung bases. No other acute abnormalities. IMPRESSION: The side port of the OG tube is near the GE junction. Consider advancement of the tube further into the stomach. Electronically Signed   By: Dorise Bullion III M.D   On: 03/16/2016 18:28   Ct Chest Wo Contrast  Result Date: 03/07/2016 CLINICAL DATA:  History of COPD with respiratory distress for the past 3 days EXAM: CT CHEST WITHOUT CONTRAST TECHNIQUE: Multidetector CT imaging of the chest was performed following the standard protocol without IV contrast. COMPARISON:  03/04/2016, 01/21/2016 FINDINGS: Cardiovascular: Calcifications of the thoracic aorta are noted without aneurysmal dilatation. The cardiac structures show coronary calcifications. Mediastinum/Nodes: The thoracic inlet demonstrates some nodularity within the right lobe of the thyroid. This is stable from the prior exam. No significant hilar or mediastinal adenopathy is noted. Lungs/Pleura: Bilateral pleural effusions are seen left slightly greater than right. Some associated lower lobe consolidation is noted again left greater than right. These changes are relatively stable from the prior exam. The  ground-glass density seen in the left upper lobe is again identified and has improved somewhat in the interval from the prior exam. Scarring in the right upper lobe is again noted stable from the prior exam as well. No new focal infiltrate is seen. Upper Abdomen: Stable cystic lesions are again seen in the spleen. Right adrenal lesion is again noted and stable. Musculoskeletal: No acute bony abnormality noted. IMPRESSION: Bilateral pleural effusions and associated lower lobe consolidation stable from the prior study. Stable changes in the right upper lobe similar to that seen on the recent exam. Slight improvement in the ground-glass density within the left upper lobe. Electronically Signed   By: Inez Catalina M.D.   On: 03/07/2016 16:36   Dg Chest Port 1 View  Result Date: 03/24/2016 CLINICAL DATA:  Dyspnea.  History of asthma, COPD, lung cancer. EXAM: PORTABLE CHEST 1 VIEW COMPARISON:  03/20/2016 FINDINGS: Left internal jugular approach central venous catheter unchanged. Cardiomediastinal silhouette is mildly enlarged. Mediastinal contours appear intact. Calcific atherosclerotic disease of the aorta. There is no evidence of pneumothorax. There are persistent  bilateral pleural effusions, slightly enlarged from the prior radiograph. Postsurgical/posttreatment changes in the right upper lobe are stable. Stable prominence of the right hilum. Osseous structures are without acute abnormality. Soft tissues are grossly normal. IMPRESSION: Slightly enlarged bilateral pleural effusions with bilateral lower lobe airspace consolidation versus atelectasis. Stable posttreatment changes in the right upper lobe. Stable prominence of the right hilum. Electronically Signed   By: Fidela Salisbury M.D.   On: 03/24/2016 11:27  Dg Chest Port 1 View  Result Date: 03/20/2016 CLINICAL DATA:  Respiratory failure. EXAM: PORTABLE CHEST 1 VIEW COMPARISON:  03/19/2016.  01/23/2016. FINDINGS: Left IJ line in stable position.  Cardiomegaly with progressive bilateral pulmonary interstitial prominence and bilateral pleural effusions consistent with progressive congestive heart failure. Persistent density in the right upper lobe without significant interim change and most likely related to radiation change. Surgical clips right upper chest. IMPRESSION: 1.  Left IJ line stable position. 2. Congestive heart failure with bilateral pulmonary interstitial edema and pleural effusions. 3. Persistent density in the right upper lobe most consistent with scarring and/or radiation change. Electronically Signed   By: Marcello Moores  Register   On: 03/20/2016 07:11  Dg Chest Port 1 View  Result Date: 03/19/2016 CLINICAL DATA:  Respiratory failure EXAM: PORTABLE CHEST 1 VIEW COMPARISON:  03/18/2016 FINDINGS: Cardiomegaly. No frank interstitial edema. Moderate left and small right pleural effusions. Associated bilateral lower lobe opacities, likely atelectasis. Stable platelike opacity in the right upper lobe, presumably radiation changes. Left IJ venous catheter terminates in the lower SVC. IMPRESSION: Moderate left and small right pleural effusions. No frank interstitial edema. Electronically Signed   By: Julian Hy M.D.   On: 03/19/2016 11:44  Dg Chest Port 1 View  Result Date: 03/18/2016 CLINICAL DATA:  Respiratory failure. EXAM: PORTABLE CHEST 1 VIEW COMPARISON:  03/17/2016 and CT chest 03/07/2016, 01/21/2016. FINDINGS: Interval extubation. Nasogastric tube is been removed as well. Left IJ central line tip projects over the SVC. Basilar dependent airspace opacification with left lower lobe collapse/ consolidation. Small right pleural effusion and moderate left pleural effusion. Oblong opacity in the apex of the right hemi thorax is seen with fiducial markers, stable. IMPRESSION: 1. Basilar dependent airspace opacification with bilateral pleural effusions. Findings may be due to congestive heart failure. 2. Left lower lobe  collapse/consolidation. 3. Oblong opacity with fiducial markers in the right upper lobe, better evaluated on cross-sectional imaging 03/07/2016 and 01/21/2016. Electronically Signed   By: Lorin Picket M.D.   On: 03/18/2016 07:59   Dg Chest Port 1 View  Result Date: 03/17/2016 CLINICAL DATA:  Acute respiratory failure. EXAM: PORTABLE CHEST 1 VIEW COMPARISON:  Chest radiograph March 16, 2016 FINDINGS: Chest radiograph is enlarged, similar to prior imaging. Calcified aortic knob. Similar pulmonary vascular congestion with increasing RIGHT, similar LEFT small to moderate pleural effusions. Interstitial prominence. Bibasilar consolidation. Similar fullness of the hilum. No pneumothorax. Endotracheal tube tip projects 2.5 cm above the carina. Nasogastric tube past proximal stomach, distal tip not imaged. LEFT internal jugular central venous catheter distal tip projects in mid superior vena cava. Soft tissue planes and included osseous structures are nonsuspicious. Surgical clips project in LEFT upper abdomen. IMPRESSION: Stable cardiomegaly and pulmonary edema with increasing RIGHT, similar LEFT small to moderate pleural effusions. Underlying consolidation. No apparent change in life-support lines. Electronically Signed   By: Elon Alas M.D.   On: 03/17/2016 04:38   Dg Chest Port 1 View  Result Date: 03/16/2016 CLINICAL DATA:  Line placement EXAM: PORTABLE CHEST 1 VIEW COMPARISON:  March 16, 2016 FINDINGS: The ETT is in good position. A new left central line terminates in mid SVC. No pneumothorax is identified. Opacity in the medial right upper lung is similar in the interval. The left-sided pleural effusion with underlying opacity is stable. The effusion and opacity in the right base has improved. No change in cardiomegaly. No other interval changes. IMPRESSION: A new left IJ is identified in good position with no pneumothorax. The left-sided effusion and opacity is stable while the right-sided effusion  and opacity has improved. Electronically Signed   By: Dorise Bullion III M.D   On: 03/16/2016 18:28   Dg Chest Port 1 View  Result Date: 03/16/2016 CLINICAL DATA:  Status post intubation and NG tube placement. Shortness of breath. EXAM: PORTABLE CHEST 1 VIEW COMPARISON:  Single-view of the chest earlier today. FINDINGS: NG tube is seen with the side port at the gastroesophageal junction and tip just within the stomach. The tube should be advanced approximately 5 cm for better positioning. New endotracheal tube is in place with the tip in good position at the level of the clavicular heads. Bilateral effusions and airspace disease persist without marked change since the study earlier today. IMPRESSION: ET tube in good position. NG tube should be advanced approximately 5 cm for better positioning. Bilateral pleural effusions and basilar airspace disease. Electronically Signed   By: Inge Rise M.D.   On: 03/16/2016 15:52   Dg Chest Port 1 View  Result Date: 03/16/2016 CLINICAL DATA:  Difficulty breathing today. EXAM: PORTABLE CHEST 1 VIEW COMPARISON:  Single-view of the chest 03/10/2016 and 03/04/2016. CT chest 03/07/2016. FINDINGS: Moderate bilateral pleural effusions have increased in size since the most recent examination. Associated basilar atelectasis is noted. There is cardiomegaly and mild interstitial edema. IMPRESSION: Increased moderate bilateral pleural effusions and basilar atelectasis. Cardiomegaly and mild interstitial edema. Electronically Signed   By: Inge Rise M.D.   On: 03/16/2016 14:38   Dg Chest Port 1 View  Result Date: 03/10/2016 CLINICAL DATA:  Acute respiratory failure, asthma -COPD, CHF, coronary artery disease EXAM: PORTABLE CHEST 1 VIEW COMPARISON:  Portable chest x-ray of March 04, 2016 and chest CT scan of March 07, 2016 FINDINGS: The lungs are well-expanded. There remain bilateral pleural effusions which have increased slightly in volume. The pulmonary vascularity is  less engorged. The interstitial markings are less prominent. The cardiac silhouette remains enlarged. There is calcification in the wall of the aortic arch. IMPRESSION: Mild interval improvement in pulmonary interstitial edema and bilateral pleural effusions. Persistent left basilar atelectasis or pneumonia. Electronically Signed   By: David  Martinique M.D.   On: 03/10/2016 07:51     Assessment and Plan  81 y.o. female   1. Acute on chronic respiratory failure with hypoxia and hypercapnia: -Multifactorial including COPD exacerbation  (acute on chronic combined CHF, exacerbated by atrial fibrillation) On BiPAP for support  May benefit from prednisone if symptoms get worse  2. Acute on chronic combined CHF:  EF of 35-40%,  tachycardia-induced cardiomyopathy  Given climbing creatinine, higher than yesterday, could hold Lasix for now Order placed for limited repeat echocardiogram with [facility], also to  estimate right heart pressures This may help guide diuresis and etiology of shortness of breath  3. Chronic Afib with RVR s/p DCCV last admission: difficult to control and is about as well controlled as we will likely be able to obtain  -She did not maintain sinus rhythm s/p recent DCCV, thus the chance of her maintaining sinus rhythm  is quite low We'll recommend outpatient EP clinic follow-up amiodarone 400 mg bid with metoprolol 50 mg twice a day Would restart anticoagulation -CHADS2VASc at least 7 (CHF, HTN, age x 2, DM, vascular disease, sex category)  4. CAD: -No symptoms concerning for angina -Continue current medical therapy   5. Acute anemia: Scheduled to see hematology as an outpatient As no blood loss anemia, could restart anticoagulation  6. Acute on CKD stage III: Possibly secondary to overdiuresis, numbers worse in the past 2 days Could hold Lasix for now   Discussed case with Dr. Posey Pronto, case management  Total encounter time more than 35 minutes  Greater than 50%  was spent in counseling and coordination of care with the patient   Signed, Esmond Plants, MD, Ph.D [facility]

## 2016-03-25 DIAGNOSIS — I10 Essential (primary) hypertension: Secondary | ICD-10-CM

## 2016-03-25 LAB — GLUCOSE, CAPILLARY
GLUCOSE-CAPILLARY: 131 mg/dL — AB (ref 65–99)
Glucose-Capillary: 140 mg/dL — ABNORMAL HIGH (ref 65–99)
Glucose-Capillary: 117 mg/dL — ABNORMAL HIGH (ref 65–99)
Glucose-Capillary: 162 mg/dL — ABNORMAL HIGH (ref 65–99)

## 2016-03-25 LAB — BASIC METABOLIC PANEL
Anion gap: 7 (ref 5–15)
BUN: 73 mg/dL — ABNORMAL HIGH (ref 6–20)
CHLORIDE: 100 mmol/L — AB (ref 101–111)
CO2: 32 mmol/L (ref 22–32)
CREATININE: 3.88 mg/dL — AB (ref 0.44–1.00)
Calcium: 8.6 mg/dL — ABNORMAL LOW (ref 8.9–10.3)
GFR calc non Af Amer: 10 mL/min — ABNORMAL LOW (ref >60)
GFR, EST AFRICAN AMERICAN: 12 mL/min — AB (ref >60)
GLUCOSE: 138 mg/dL — AB (ref 65–99)
Potassium: 5.1 mmol/L (ref 3.5–5.1)
Sodium: 139 mmol/L (ref 135–145)

## 2016-03-25 LAB — ECHOCARDIOGRAM LIMITED
HEIGHTINCHES: 65 in
WEIGHTICAEL: 3072 [oz_av]

## 2016-03-25 NOTE — Progress Notes (Signed)
Patient: Regina Burke / Admit Date: 03/16/2016 / Date of Encounter: 03/25/2016, 11:29 AM   Subjective: Pt needing to wear BIPAP more due to respiratory distress. No CP. Lasix has been on hold due to worsening renal failure.  Review of Systems: Review of Systems  Constitutional: Positive for malaise/fatigue.  Respiratory: Positive for shortness of breath.   Cardiovascular: Negative.   Gastrointestinal: Negative.   Musculoskeletal: Negative.   Neurological: Positive for weakness.  Psychiatric/Behavioral: Negative.   All other systems reviewed and are negative.   Objective: Telemetry: Atrial fibrillation with rate around 100 -110s bpm Physical Exam: Blood pressure 117/65, pulse (!) 110, temperature 98.2 F (36.8 C), resp. rate 20, height '5\' 5"'$  (1.651 m), weight 191 lb 14.4 oz (87 kg), SpO2 100 %. Body mass index is 31.93 kg/m. General: Well developed, well nourished, mild distress, on BiPAP Head: Normocephalic, atraumatic Neck: Negative for carotid bruits. JVP elevated. Lungs: Decreased breath sounds bilaterally with diffuse bilateral crackles.  Heart: Irregularly-irregular S1 S2 without murmurs, rubs, or gallops.  Abdomen: Soft, non-tender, non-distended with normoactive bowel sounds. No rebound/guarding. Extremities: No clubbing or cyanosis. +-++ edema. Distal pedal pulses are 2+ and equal bilaterally. Neuro: Alert and oriented X 3. Moves all extremities spontaneously. Psych:  Responds to questions appropriately with a normal affect.   Intake/Output Summary (Last 24 hours) at 03/25/16 1129 Last data filed at 03/25/16 1000  Gross per 24 hour  Intake              120 ml  Output              350 ml  Net             -230 ml    Inpatient Medications:  . amiodarone  400 mg Oral BID  . ferrous sulfate  325 mg Oral Q breakfast  . fluticasone furoate-vilanterol  1 puff Inhalation Daily  . guaiFENesin  600 mg Oral BID  . insulin aspart  0-15 Units Subcutaneous TID WC  .  insulin aspart  0-5 Units Subcutaneous QHS  . metoprolol tartrate  50 mg Oral BID  . pantoprazole  40 mg Oral Q1200  . rivaroxaban  15 mg Oral Q supper  . sertraline  50 mg Oral Daily  . sodium chloride flush  10-40 mL Intracatheter Q12H  . tiotropium  18 mcg Inhalation Daily   Infusions:    Labs:  Recent Labs  03/24/16 1051 03/25/16 0519  NA 137 139  K 4.5 5.1  CL 97* 100*  CO2 30 32  GLUCOSE 179* 138*  BUN 67* 73*  CREATININE 3.51* 3.88*  CALCIUM 8.5* 8.6*   No results for input(s): AST, ALT, ALKPHOS, BILITOT, PROT, ALBUMIN in the last 72 hours.  Recent Labs  03/23/16 1000 03/24/16 0940  WBC 8.5 8.5  HGB 9.3* 9.3*  HCT 27.9* 27.8*  MCV 79.4* 80.3  PLT 182 174   No results for input(s): CKTOTAL, CKMB, TROPONINI in the last 72 hours. Invalid input(s): POCBNP No results for input(s): HGBA1C in the last 72 hours.   Weights: Filed Weights   03/23/16 0449 03/24/16 0551 03/25/16 0401  Weight: 193 lb 1.6 oz (87.6 kg) 192 lb (87.1 kg) 191 lb 14.4 oz (87 kg)     Radiology/Studies:  Dg Chest 1 View  Result Date: 03/04/2016 CLINICAL DATA:  Respiratory distress EXAM: CHEST 1 VIEW COMPARISON:  Chest radiograph dated 01/24/2016. CT chest dated 01/21/2016. FINDINGS: Patchy bilateral lower lobe opacities, suspicious for pneumonia, less  likely atelectasis. Superimposed mild interstitial edema is suspected. Small bilateral pleural effusions. No pneumothorax. Again noted is a stable focal opacity in the medial right upper lobe, most of which likely reflects radiation changes when correlating with prior CT, underlying residual/recurrent tumor not excluded. Cardiomegaly. IMPRESSION: Patchy bilateral lower lobe opacities, suspicious for pneumonia, less likely atelectasis. Superimposed mild interstitial edema is suspected. Small bilateral pleural effusions. Stable focal opacity in the medial right upper lobe, most of which likely reflects radiation changes when correlating with prior CT,  underlying residual/recurrent tumor not excluded. Electronically Signed   By: Julian Hy M.D.   On: 03/04/2016 21:22   Dg Abd 1 View  Result Date: 03/16/2016 CLINICAL DATA:  Evaluate OG tube EXAM: ABDOMEN - 1 VIEW COMPARISON:  None. FINDINGS: The side port of the OG tube is near the GE junction. The distal tip is in the stomach. See the chest x-ray report from the same time for description of the lung bases. No other acute abnormalities. IMPRESSION: The side port of the OG tube is near the GE junction. Consider advancement of the tube further into the stomach. Electronically Signed   By: Dorise Bullion III M.D   On: 03/16/2016 18:28   Ct Chest Wo Contrast  Result Date: 03/07/2016 CLINICAL DATA:  History of COPD with respiratory distress for the past 3 days EXAM: CT CHEST WITHOUT CONTRAST TECHNIQUE: Multidetector CT imaging of the chest was performed following the standard protocol without IV contrast. COMPARISON:  03/04/2016, 01/21/2016 FINDINGS: Cardiovascular: Calcifications of the thoracic aorta are noted without aneurysmal dilatation. The cardiac structures show coronary calcifications. Mediastinum/Nodes: The thoracic inlet demonstrates some nodularity within the right lobe of the thyroid. This is stable from the prior exam. No significant hilar or mediastinal adenopathy is noted. Lungs/Pleura: Bilateral pleural effusions are seen left slightly greater than right. Some associated lower lobe consolidation is noted again left greater than right. These changes are relatively stable from the prior exam. The ground-glass density seen in the left upper lobe is again identified and has improved somewhat in the interval from the prior exam. Scarring in the right upper lobe is again noted stable from the prior exam as well. No new focal infiltrate is seen. Upper Abdomen: Stable cystic lesions are again seen in the spleen. Right adrenal lesion is again noted and stable. Musculoskeletal: No acute bony  abnormality noted. IMPRESSION: Bilateral pleural effusions and associated lower lobe consolidation stable from the prior study. Stable changes in the right upper lobe similar to that seen on the recent exam. Slight improvement in the ground-glass density within the left upper lobe. Electronically Signed   By: Inez Catalina M.D.   On: 03/07/2016 16:36   Dg Chest Port 1 View  Result Date: 03/24/2016 CLINICAL DATA:  Dyspnea.  History of asthma, COPD, lung cancer. EXAM: PORTABLE CHEST 1 VIEW COMPARISON:  03/20/2016 FINDINGS: Left internal jugular approach central venous catheter unchanged. Cardiomediastinal silhouette is mildly enlarged. Mediastinal contours appear intact. Calcific atherosclerotic disease of the aorta. There is no evidence of pneumothorax. There are persistent bilateral pleural effusions, slightly enlarged from the prior radiograph. Postsurgical/posttreatment changes in the right upper lobe are stable. Stable prominence of the right hilum. Osseous structures are without acute abnormality. Soft tissues are grossly normal. IMPRESSION: Slightly enlarged bilateral pleural effusions with bilateral lower lobe airspace consolidation versus atelectasis. Stable posttreatment changes in the right upper lobe. Stable prominence of the right hilum. Electronically Signed   By: Fidela Salisbury M.D.   On: 03/24/2016  11:27  Dg Chest Port 1 View  Result Date: 03/20/2016 CLINICAL DATA:  Respiratory failure. EXAM: PORTABLE CHEST 1 VIEW COMPARISON:  03/19/2016.  01/23/2016. FINDINGS: Left IJ line in stable position. Cardiomegaly with progressive bilateral pulmonary interstitial prominence and bilateral pleural effusions consistent with progressive congestive heart failure. Persistent density in the right upper lobe without significant interim change and most likely related to radiation change. Surgical clips right upper chest. IMPRESSION: 1.  Left IJ line stable position. 2. Congestive heart failure with  bilateral pulmonary interstitial edema and pleural effusions. 3. Persistent density in the right upper lobe most consistent with scarring and/or radiation change. Electronically Signed   By: Marcello Moores  Register   On: 03/20/2016 07:11  Dg Chest Port 1 View  Result Date: 03/19/2016 CLINICAL DATA:  Respiratory failure EXAM: PORTABLE CHEST 1 VIEW COMPARISON:  03/18/2016 FINDINGS: Cardiomegaly. No frank interstitial edema. Moderate left and small right pleural effusions. Associated bilateral lower lobe opacities, likely atelectasis. Stable platelike opacity in the right upper lobe, presumably radiation changes. Left IJ venous catheter terminates in the lower SVC. IMPRESSION: Moderate left and small right pleural effusions. No frank interstitial edema. Electronically Signed   By: Julian Hy M.D.   On: 03/19/2016 11:44  Dg Chest Port 1 View  Result Date: 03/18/2016 CLINICAL DATA:  Respiratory failure. EXAM: PORTABLE CHEST 1 VIEW COMPARISON:  03/17/2016 and CT chest 03/07/2016, 01/21/2016. FINDINGS: Interval extubation. Nasogastric tube is been removed as well. Left IJ central line tip projects over the SVC. Basilar dependent airspace opacification with left lower lobe collapse/ consolidation. Small right pleural effusion and moderate left pleural effusion. Oblong opacity in the apex of the right hemi thorax is seen with fiducial markers, stable. IMPRESSION: 1. Basilar dependent airspace opacification with bilateral pleural effusions. Findings may be due to congestive heart failure. 2. Left lower lobe collapse/consolidation. 3. Oblong opacity with fiducial markers in the right upper lobe, better evaluated on cross-sectional imaging 03/07/2016 and 01/21/2016. Electronically Signed   By: Lorin Picket M.D.   On: 03/18/2016 07:59   Dg Chest Port 1 View  Result Date: 03/17/2016 CLINICAL DATA:  Acute respiratory failure. EXAM: PORTABLE CHEST 1 VIEW COMPARISON:  Chest radiograph March 16, 2016 FINDINGS: Chest  radiograph is enlarged, similar to prior imaging. Calcified aortic knob. Similar pulmonary vascular congestion with increasing RIGHT, similar LEFT small to moderate pleural effusions. Interstitial prominence. Bibasilar consolidation. Similar fullness of the hilum. No pneumothorax. Endotracheal tube tip projects 2.5 cm above the carina. Nasogastric tube past proximal stomach, distal tip not imaged. LEFT internal jugular central venous catheter distal tip projects in mid superior vena cava. Soft tissue planes and included osseous structures are nonsuspicious. Surgical clips project in LEFT upper abdomen. IMPRESSION: Stable cardiomegaly and pulmonary edema with increasing RIGHT, similar LEFT small to moderate pleural effusions. Underlying consolidation. No apparent change in life-support lines. Electronically Signed   By: Elon Alas M.D.   On: 03/17/2016 04:38   Dg Chest Port 1 View  Result Date: 03/16/2016 CLINICAL DATA:  Line placement EXAM: PORTABLE CHEST 1 VIEW COMPARISON:  March 16, 2016 FINDINGS: The ETT is in good position. A new left central line terminates in mid SVC. No pneumothorax is identified. Opacity in the medial right upper lung is similar in the interval. The left-sided pleural effusion with underlying opacity is stable. The effusion and opacity in the right base has improved. No change in cardiomegaly. No other interval changes. IMPRESSION: A new left IJ is identified in good position with no pneumothorax.  The left-sided effusion and opacity is stable while the right-sided effusion and opacity has improved. Electronically Signed   By: Dorise Bullion III M.D   On: 03/16/2016 18:28   Dg Chest Port 1 View  Result Date: 03/16/2016 CLINICAL DATA:  Status post intubation and NG tube placement. Shortness of breath. EXAM: PORTABLE CHEST 1 VIEW COMPARISON:  Single-view of the chest earlier today. FINDINGS: NG tube is seen with the side port at the gastroesophageal junction and tip just within  the stomach. The tube should be advanced approximately 5 cm for better positioning. New endotracheal tube is in place with the tip in good position at the level of the clavicular heads. Bilateral effusions and airspace disease persist without marked change since the study earlier today. IMPRESSION: ET tube in good position. NG tube should be advanced approximately 5 cm for better positioning. Bilateral pleural effusions and basilar airspace disease. Electronically Signed   By: Inge Rise M.D.   On: 03/16/2016 15:52   Dg Chest Port 1 View  Result Date: 03/16/2016 CLINICAL DATA:  Difficulty breathing today. EXAM: PORTABLE CHEST 1 VIEW COMPARISON:  Single-view of the chest 03/10/2016 and 03/04/2016. CT chest 03/07/2016. FINDINGS: Moderate bilateral pleural effusions have increased in size since the most recent examination. Associated basilar atelectasis is noted. There is cardiomegaly and mild interstitial edema. IMPRESSION: Increased moderate bilateral pleural effusions and basilar atelectasis. Cardiomegaly and mild interstitial edema. Electronically Signed   By: Inge Rise M.D.   On: 03/16/2016 14:38   Dg Chest Port 1 View  Result Date: 03/10/2016 CLINICAL DATA:  Acute respiratory failure, asthma -COPD, CHF, coronary artery disease EXAM: PORTABLE CHEST 1 VIEW COMPARISON:  Portable chest x-ray of March 04, 2016 and chest CT scan of March 07, 2016 FINDINGS: The lungs are well-expanded. There remain bilateral pleural effusions which have increased slightly in volume. The pulmonary vascularity is less engorged. The interstitial markings are less prominent. The cardiac silhouette remains enlarged. There is calcification in the wall of the aortic arch. IMPRESSION: Mild interval improvement in pulmonary interstitial edema and bilateral pleural effusions. Persistent left basilar atelectasis or pneumonia. Electronically Signed   By: David  Martinique M.D.   On: 03/10/2016 07:51   [place] 12/23/2014: Left main:  Short segment. No obstructive disease.   Left Anterior Descending Artery: Large caliber vessel that courses to the apex. The proximal vessel has diffuse mild plaque. There is a 50% stenosis in the mid vessel just before the takeoff of the first diagonal branch. The remainder of the mid vessel has mild diffuse plaque. The distal vessel has mild diffuse plaque. The first diagonal branch is a moderate caliber vessel with mid 30% stenosis. The second major diagonal branch is a small caliber vessel with proximal 40% stenosis.   Circumflex Artery: Large caliber vessel with 3 obtuse marginal branches. The AV groove Circumflex has mild plaque in the proximal and mid segments. The first obtuse marginal branch is a moderate caliber vessel with proximal 20% stenosis. The second obtuse marginal branch is a moderate caliber vessel with mid 20% stenosis. The third obtuse marginal branch is a moderate caliber vessel with proximal 90% hazy stenosis. The mid segment of OM3 has a 50% stenosis.   Right Coronary Artery: Moderate caliber non-dominant vessel with no obstructive disease.   Left Ventricular Angiogram: Deferred.   Impression: 1. Severe single vessel CAD 2. Severe stenosis OM3 3. Unstable angina 4. Successful PTCA/DES x 1 OM3  Recommendations: She will need dual anti-platelet therapy with ASA and  Plavix for at least 12 months. Continue beta blocker and statin.   Limited echo 03/25/2016: Left ventricle: The cavity size was normal. There was mild   concentric hypertrophy. Rhythm still atrial fibrillation with VR   in 100-110s. Study used definity contrast for LV opacifcation.   Systolic function appears moderately reduced. The estimated   ejection fraction was in the range of 35% to 40%. Abnormal septal   motion due to LBBB appears to contribute to this significantly. - Ventricular septum: Septal motion showed abnormal function and   paradox. These changes are consistent with a left bundle  branch   block. - Left atrium: The atrium was severely dilated. - Right atrium: The atrium was moderately dilated. - Tricuspid valve: There was moderate-severe regurgitation. - Pulmonary arteries: Systolic pressure was moderately increased. - Pericardium, extracardiac: There was a left pleural effusion.    Assessment and Plan  82 y.o. female   1. Acute on chronic respiratory failure with hypoxia and hypercapnia: -Multifactorial including COPD exacerbation  (acute on chronic combined CHF, exacerbated by atrial fibrillation) On BiPAP for support   2. 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 LHC from 11/2014 showed single vessel cad treated with PCI .  EF was verified on limited echo with contrast. Previously, PA SP were severely elevated. On most recent one, only slightly decreased. Pulmonary hypertension is also likely also multifactorial - pulmonary venous, and also secondary to intrinsic lung disease. Physical examination reveals evidence of volume overload. Weight has gone up compared to previous admission, roughly 10 pounds. Will await input from nephrology as to how to get rid of excess fluid whether trial of another diuretic or possible ultrafiltration. Discussed with Dr. Tressia Miners. (If after adequate diuresis and rate control achieved, consider rpt echo to ffup EF.)  3. Chronic Afib with RVR s/p DCCV last admission: difficult to control and is about as well controlled as we will likely be able to obtain -She did not maintain sinus rhythm s/p recent DCCV, thus the chance of her maintaining sinus rhythm is quite low We'll recommend outpatient EP clinic follow-up. Possible AVN ablation with PPM  amiodarone 400 mg bid with metoprolol 50 mg can be increased 3 times a day Continue Xarelto -CHADS2VASc at least 7 (CHF, HTN, age x 2, DM, vascular disease, sex category)  4. CAD s/p PCI 11/2014 -No symptoms concerning for angina -Continue  current medical therapy   5. HTN BP wnl. Continue to monitor.   6. Acute anemia: Scheduled to see hematology as an outpatient As no blood loss anemia, could restart anticoagulation  7. Acute on CKD stage III: Await nephrology input    Total encounter time more than 35 minutes  Greater than 50% was spent in counseling and coordination of care with the patient  Discussed recommendations with Dr. Tressia Miners. Patient's son in room during this encounter.   Signed, Wende Bushy, MD, [place]

## 2016-03-25 NOTE — Clinical Social Work Note (Signed)
Patient not discharging to [facility] today due to not being medically stable for discharge. CSW notified Broadus John at [facility]. [facility] does have the bipap at their facility on standby for her admission. Shela Leff MSW,LCSW [phone]

## 2016-03-25 NOTE — Progress Notes (Signed)
Pt taken off bipap, placed on 3lpm [place], respiratory rate 20/min, sats 92%, tolerating well at this time

## 2016-03-25 NOTE — Progress Notes (Signed)
Pt with dark amber colored urine, only 27m throughout shift. Pt requesting bipap frequently stating that she feels SOB. Pts o2 sats WNL during period of reported SOB. No pain reported at this time. Pt with poor PO intake and appetite. Pts VSS, A&O X4. Family at bedside, multiple concerns and questions answered. Will continue to monitor.

## 2016-03-25 NOTE — Progress Notes (Signed)
[place] at [place] NAME: Regina Burke    MR#:  [id number]  DATE OF BIRTH:  12-03-1932  SUBJECTIVE:  CHIEF COMPLAINT:   Chief Complaint  Patient presents with  . Shortness of Breath   - has been requiring on and off Bipap due to persistent shortness of breath. - creatinine worsening, CXR with worsened pleural effusions - complains of dysuria, urgency but unable to urinate  REVIEW OF SYSTEMS:  Review of Systems  Constitutional: Negative for chills, fever and malaise/fatigue.  HENT: Negative for ear discharge, ear pain and nosebleeds.   Respiratory: Positive for shortness of breath. Negative for cough and wheezing.   Cardiovascular: Positive for palpitations and orthopnea. Negative for chest pain.  Gastrointestinal: Negative for abdominal pain, constipation, diarrhea, nausea and vomiting.  Genitourinary: Positive for dysuria. Negative for urgency.       Decreased urination  Musculoskeletal: Negative for myalgias.  Neurological: Negative for dizziness, speech change, focal weakness, seizures and headaches.  Psychiatric/Behavioral: The patient is nervous/anxious.     DRUG ALLERGIES:   Allergies  Allergen Reactions  . Ciprofloxacin Shortness Of Breath, Itching and Rash  . Doxycycline Shortness Of Breath, Itching and Rash  . Penicillins Shortness Of Breath, Itching, Rash and Other (See Comments)    Has patient had a PCN reaction causing immediate rash, facial/tongue/throat swelling, SOB or lightheadedness with hypotension: Yes Has patient had a PCN reaction causing severe rash involving mucus membranes or skin necrosis: No Has patient had a PCN reaction that required hospitalization No Has patient had a PCN reaction occurring within the last 10 years: No If all of the above answers are "NO", then may proceed with Cephalosporin use.  . Sulfa Antibiotics Shortness Of Breath, Itching and Rash  . Morphine And Related Itching  .  Albuterol Sulfate   . Cefuroxime Rash    blisters    VITALS:  Blood pressure 117/65, pulse (!) 110, temperature 98.2 F (36.8 C), resp. rate 20, height '5\' 5"'$  (1.651 m), weight 87 kg (191 lb 14.4 oz), SpO2 100 %.  PHYSICAL EXAMINATION:  Physical Exam  GENERAL:  82 y.o.-year-old obese patient lying in the bed, complaining of dyspnea, not tachypneic,  EYES: Pupils equal, round, reactive to light and accommodation. No scleral icterus. Extraocular muscles intact.  HEENT: Head atraumatic, normocephalic. Oropharynx and nasopharynx clear.  NECK:  Supple, no jugular venous distention. No thyroid enlargement, no tenderness.  LUNGS: Normal breath sounds bilaterally, no wheezing, rhonchi or crepitation. No use of accessory muscles of respiration. Bibasilar crackles heard. CARDIOVASCULAR: S1, S2 normal. No murmurs, rubs, or gallops.  ABDOMEN: Soft, obese, nontender, nondistended. Bowel sounds present. No organomegaly or mass.  EXTREMITIES: No cyanosis, or clubbing. No pedal edema NEUROLOGIC: Cranial nerves II through XII are intact. Muscle strength 5/5 in all extremities. Sensation intact. Gait not checked. Global weakness PSYCHIATRIC: The patient is alert and oriented x 3.  SKIN: No obvious rash, lesion, or ulcer.    LABORATORY PANEL:   CBC  Recent Labs Lab 03/24/16 0940  WBC 8.5  HGB 9.3*  HCT 27.8*  PLT 174   ------------------------------------------------------------------------------------------------------------------  Chemistries   Recent Labs Lab 03/25/16 0519  NA 139  K 5.1  CL 100*  CO2 32  GLUCOSE 138*  BUN 73*  CREATININE 3.88*  CALCIUM 8.6*   ------------------------------------------------------------------------------------------------------------------  Cardiac Enzymes No results for input(s): TROPONINI in the last 168 hours. ------------------------------------------------------------------------------------------------------------------  RADIOLOGY:   Dg Chest Port 1 View  Result Date: 03/24/2016  CLINICAL DATA:  Dyspnea.  History of asthma, COPD, lung cancer. EXAM: PORTABLE CHEST 1 VIEW COMPARISON:  03/20/2016 FINDINGS: Left internal jugular approach central venous catheter unchanged. Cardiomediastinal silhouette is mildly enlarged. Mediastinal contours appear intact. Calcific atherosclerotic disease of the aorta. There is no evidence of pneumothorax. There are persistent bilateral pleural effusions, slightly enlarged from the prior radiograph. Postsurgical/posttreatment changes in the right upper lobe are stable. Stable prominence of the right hilum. Osseous structures are without acute abnormality. Soft tissues are grossly normal. IMPRESSION: Slightly enlarged bilateral pleural effusions with bilateral lower lobe airspace consolidation versus atelectasis. Stable posttreatment changes in the right upper lobe. Stable prominence of the right hilum. Electronically Signed   By: Fidela Salisbury M.D.   On: 03/24/2016 11:27   EKG:   Orders placed or performed during the hospital encounter of 03/04/16  . EKG 12-Lead  . EKG 12-Lead  . EKG 12-Lead  . EKG 12-Lead  . EKG    ASSESSMENT AND PLAN:   82 yo female with a PMH of Type II diabetes, anemia, GERD, cushing's disease, asthma, CKD stage III, pneumonia, edema, paroxysmal atrial fibrillation, HTN, lung cancer s/p radiation 2015, CAD, NSTEMI, HTN, diverticulitis, and gout. She presented to [facility] ER via EMS on 7/20 with c/o worsening shortness of breath.   #1 Acute on chronic respiratory failure with hypoxemia and hypercapnia-secondary to acute on chronic combined CHF exacerbation. - Patient was aggressively diuresed on admission, now creatinine worsening but chest x-ray still showing pleural effusions worse. - lasix held now -repeat echo ordered and also assessment of right heart pressures. -Continue BiPAP as needed. On 3 L oxygen.  #2 ARF on CKD- baseline cr around 2, now worsening at 3.8,  decreased urine output - no acute indication for dialysis yet - foley catheter, nephrology consulted. Hold nephrotoxins -lasix held, monitor closely   #3 COPD-stable at this time. Patient allergic to albuterol and Xopenex. -Continue Atrovent nebs. Not on systemic steroids. -Continue inhalers  #4 chronic atrial fibrillation-with difficult to control heart rate. -Continue amiodarone orally. Also on metoprolol. -Had developed each digitoxin toxicity twice in the past, so no digoxin at this time. -EP referral as outpatient to see if she needs AV nodal ablation. -Failed DC cardioversion in the past -Xarelto restarted as hemoglobin is stable.  #5 acute on chronic anemia-significant iron deficiency and also anemia of chronic disease. -IV iron. Continue outpatient follow-up with cancer center. -No indication for transfusion at this time. Continue to monitor while on Xarelto  #6 diabetes mellitus-on sliding scale insulin  #7 DVT prophylaxis-on Xarelto   If continues to get worse- consider palliative care consultation     All the records are reviewed and case discussed with Care Management/Social Workerr. Management plans discussed with the patient, family and they are in agreement.  CODE STATUS: DNR  TOTAL TIME TAKING CARE OF THIS PATIENT: 38 minutes.   POSSIBLE D/C IN 1-2 DAYS, DEPENDING ON CLINICAL CONDITION.   Gladstone Lighter M.D on 03/25/2016 at 11:33 AM  Between 7am to 6pm - Pager - [phone]  After 6pm go to [email or web address] - password [email or web address] [facility]  Office  [phone]  CC: Primary care physician; Tracie Harrier, MD

## 2016-03-25 NOTE — Progress Notes (Signed)
Patient has been short of breath of and on all night. Has been on Bipap most of the night. Is sitting up in chair now as she seems to breath better sitting up. Will continue to monitor.

## 2016-03-25 NOTE — Progress Notes (Signed)
[facility]  ROUNDING NOTE   Subjective:  Patient well known to [place] from prior inpatient visit as well as outpatient monitoring. The patient's baseline creatinine is 1.48. She has been undergoing diuresis this admission. Creatinine currently up to 3.88 with a BUN of 73. She is on BiPAP at the moment. Reports fullness in her bladder.    Objective:  Vital signs in last 24 hours:  Temp:  [97.7 F (36.5 C)-98.2 F (36.8 C)] 98.2 F (36.8 C) (07/29 0401) Pulse Rate:  [106-113] 110 (07/29 1019) Resp:  [20-26] 20 (07/29 0401) BP: (117-119)/(65-74) 117/65 (07/29 0401) SpO2:  [91 %-100 %] 100 % (07/29 1019) FiO2 (%):  [32 %] 32 % (07/28 1941) Weight:  [87 kg (191 lb 14.4 oz)] 87 kg (191 lb 14.4 oz) (07/29 0401)  Weight change: -0.045 kg (-1.6 oz) Filed Weights   03/23/16 0449 03/24/16 0551 03/25/16 0401  Weight: 87.6 kg (193 lb 1.6 oz) 87.1 kg (192 lb) 87 kg (191 lb 14.4 oz)    Intake/Output: I/O last 3 completed shifts: In: 270 [P.O.:240; I.V.:30] Out: 550 [Urine:550]   Intake/Output this shift:  Total I/O In: 120 [P.O.:120] Out: -   Physical Exam: General: Currently on bipap  Head: Normocephalic, atraumatic. bipap facemask on  Eyes: Anicteric  Neck: Supple, trachea midline  Lungs:  Bilateral rhonchi, on bipap  Heart: S1S2 no rubs  Abdomen:  Soft, nontender, BS present   Extremities:  peripheral edema.  Neurologic: Nonfocal, moving all four extremities  Skin: No lesions  Access:     Basic Metabolic Panel:  Recent Labs Lab 03/21/16 0500 03/22/16 0404 03/23/16 1000 03/24/16 1051 03/25/16 0519  NA 139 138 139 137 139  K 3.3* 3.7 4.2 4.5 5.1  CL 96* 95* 97* 97* 100*  CO2 33* '31 31 30 '$ 32  GLUCOSE 140* 150* 161* 179* 138*  BUN 51* 51* 63* 67* 73*  CREATININE 2.40* 2.52* 3.25* 3.51* 3.88*  CALCIUM 8.2* 8.3* 8.7* 8.5* 8.6*    Liver Function Tests: No results for input(s): AST, ALT, ALKPHOS, BILITOT, PROT, ALBUMIN in the last 168 hours. No  results for input(s): LIPASE, AMYLASE in the last 168 hours. No results for input(s): AMMONIA in the last 168 hours.  CBC:  Recent Labs Lab 03/19/16 0500 03/21/16 0508 03/23/16 1000 03/24/16 0940  WBC 5.5 5.6 8.5 8.5  NEUTROABS  --  4.4  --   --   HGB 8.3* 8.3* 9.3* 9.3*  HCT 24.7* 24.8* 27.9* 27.8*  MCV 79.5* 80.3 79.4* 80.3  PLT 154 140* 182 174    Cardiac Enzymes: No results for input(s): CKTOTAL, CKMB, CKMBINDEX, TROPONINI in the last 168 hours.  BNP: Invalid input(s): POCBNP  CBG:  Recent Labs Lab 03/24/16 0718 03/24/16 1150 03/24/16 1617 03/24/16 2058 03/25/16 0737  GLUCAP 118* 148* 125* 154* 140*    Microbiology: Results for orders placed or performed during the hospital encounter of 03/16/16  Culture, blood (routine x 2)     Status: None   Collection Time: 03/16/16  6:12 PM  Result Value Ref Range Status   Specimen Description BLOOD RIGHT HAND  Final   Special Requests BOTTLES DRAWN AEROBIC AND ANAEROBIC 6CC  Final   Culture NO GROWTH 5 DAYS  Final   Report Status 03/21/2016 FINAL  Final  Culture, blood (routine x 2)     Status: None   Collection Time: 03/16/16  6:40 PM  Result Value Ref Range Status   Specimen Description BLOOD LEFT WRIST  Final  Special Requests BOTTLES DRAWN AEROBIC AND ANAEROBIC 8CC  Final   Culture NO GROWTH 5 DAYS  Final   Report Status 03/21/2016 FINAL  Final    Coagulation Studies: No results for input(s): LABPROT, INR in the last 72 hours.  Urinalysis: No results for input(s): COLORURINE, LABSPEC, PHURINE, GLUCOSEU, HGBUR, BILIRUBINUR, KETONESUR, PROTEINUR, UROBILINOGEN, NITRITE, LEUKOCYTESUR in the last 72 hours.  Invalid input(s): APPERANCEUR    Imaging: Dg Chest Port 1 View  Result Date: 03/24/2016 CLINICAL DATA:  Dyspnea.  History of asthma, COPD, lung cancer. EXAM: PORTABLE CHEST 1 VIEW COMPARISON:  03/20/2016 FINDINGS: Left internal jugular approach central venous catheter unchanged. Cardiomediastinal  silhouette is mildly enlarged. Mediastinal contours appear intact. Calcific atherosclerotic disease of the aorta. There is no evidence of pneumothorax. There are persistent bilateral pleural effusions, slightly enlarged from the prior radiograph. Postsurgical/posttreatment changes in the right upper lobe are stable. Stable prominence of the right hilum. Osseous structures are without acute abnormality. Soft tissues are grossly normal. IMPRESSION: Slightly enlarged bilateral pleural effusions with bilateral lower lobe airspace consolidation versus atelectasis. Stable posttreatment changes in the right upper lobe. Stable prominence of the right hilum. Electronically Signed   By: Fidela Salisbury M.D.   On: 03/24/2016 11:27    Medications:     . amiodarone  400 mg Oral BID  . ferrous sulfate  325 mg Oral Q breakfast  . fluticasone furoate-vilanterol  1 puff Inhalation Daily  . guaiFENesin  600 mg Oral BID  . insulin aspart  0-15 Units Subcutaneous TID WC  . insulin aspart  0-5 Units Subcutaneous QHS  . metoprolol tartrate  50 mg Oral BID  . pantoprazole  40 mg Oral Q1200  . rivaroxaban  15 mg Oral Q supper  . sertraline  50 mg Oral Daily  . sodium chloride flush  10-40 mL Intracatheter Q12H  . tiotropium  18 mcg Inhalation Daily   sodium chloride, ALPRAZolam, guaiFENesin, ipratropium, metoprolol, ondansetron (ZOFRAN) IV, sodium chloride flush  Assessment/ Plan:  82 y.o. female  with diabetes mellitus type II, coronary artery disease, atrial fibrillation, hypertension, hyperlipidemia, generalized anxiety disorder, gout, anemia, history of lung cancer, COPD, atrial fibrillation, fatty liver disease, GERD, osteoporosis   1. Acute Renal Failure on Chronic kidney disease stage III and proteinuria: Baseline creatinine of 1.5, eGFR of 32.  Acute renal failure from cardiorenal syndrome and then overdiuresis - Patient appears to be very sensitive to diuresis. This was a similar pattern during her  last recent admission. Agree with discontinuation of Lasix. Hasn't administer IV fluid hydration however given ongoing shortness of breath at this time.  Patient was also having a difficult time urinating and was felt to have pressure well within her bladder. Therefore we have instructed nursing to place Foley catheter. If renal function worsens we may need to consider temporary dialysis.  2. Acute respiratory failure secondary to acute systolic heart failure.  Patient has low EF of 35-40%. She was undergoing diuresis however renal function worsened. Lasix has been held. Continue BiPAP for now. Consider tapping pleural effusions.   LOS: 9 Doral Ventrella 7/29/201711:42 AM

## 2016-03-26 LAB — GLUCOSE, CAPILLARY
GLUCOSE-CAPILLARY: 105 mg/dL — AB (ref 65–99)
GLUCOSE-CAPILLARY: 134 mg/dL — AB (ref 65–99)
GLUCOSE-CAPILLARY: 138 mg/dL — AB (ref 65–99)
Glucose-Capillary: 131 mg/dL — ABNORMAL HIGH (ref 65–99)

## 2016-03-26 LAB — CBC
HCT: 24.1 % — ABNORMAL LOW (ref 35.0–47.0)
Hemoglobin: 7.9 g/dL — ABNORMAL LOW (ref 12.0–16.0)
MCH: 26.7 pg (ref 26.0–34.0)
MCHC: 32.6 g/dL (ref 32.0–36.0)
MCV: 81.8 fL (ref 80.0–100.0)
PLATELETS: 132 10*3/uL — AB (ref 150–440)
RBC: 2.94 MIL/uL — AB (ref 3.80–5.20)
RDW: 17.9 % — ABNORMAL HIGH (ref 11.5–14.5)
WBC: 4.4 10*3/uL (ref 3.6–11.0)

## 2016-03-26 LAB — BASIC METABOLIC PANEL
ANION GAP: 9 (ref 5–15)
BUN: 79 mg/dL — ABNORMAL HIGH (ref 6–20)
CO2: 31 mmol/L (ref 22–32)
Calcium: 8.5 mg/dL — ABNORMAL LOW (ref 8.9–10.3)
Chloride: 99 mmol/L — ABNORMAL LOW (ref 101–111)
Creatinine, Ser: 3.83 mg/dL — ABNORMAL HIGH (ref 0.44–1.00)
GFR calc Af Amer: 12 mL/min — ABNORMAL LOW (ref >60)
GFR, EST NON AFRICAN AMERICAN: 10 mL/min — AB (ref >60)
Glucose, Bld: 101 mg/dL — ABNORMAL HIGH (ref 65–99)
POTASSIUM: 4.7 mmol/L (ref 3.5–5.1)
SODIUM: 139 mmol/L (ref 135–145)

## 2016-03-26 MED ORDER — FUROSEMIDE 10 MG/ML IJ SOLN
5.0000 mg/h | INTRAVENOUS | Status: DC
  Administered 2016-03-26 – 2016-03-28 (×2): 5 mg/h via INTRAVENOUS
  Filled 2016-03-26 (×2): qty 25

## 2016-03-26 NOTE — Progress Notes (Signed)
A&O. Up with heavy assist. Foley in place with not much output. Only 200 out all shift. Short of breath with exertion but better than yesterday. ON Bipap through the night. Slept off and on.

## 2016-03-26 NOTE — Plan of Care (Signed)
Problem: Activity: Goal: Risk for activity intolerance will decrease Outcome: Not Progressing Short of breath with any exertion  Problem: Fluid Volume: Goal: Ability to maintain a balanced intake and output will improve Outcome: Not Progressing Poor urinary output  Problem: Activity: Goal: Ability to tolerate increased activity will improve Outcome: Not Progressing Short of breath with any exertion

## 2016-03-26 NOTE — Progress Notes (Signed)
[place] at [place] NAME: Regina Burke    MR#:  [id number]  DATE OF BIRTH:  [birth year]  SUBJECTIVE:  CHIEF COMPLAINT:   Chief Complaint  Patient presents with  . Shortness of Breath   - Feels some better today. Used BiPAP all night long. Hasn't used any this morning. -Foley catheter placed. Decreased urine output. Kidney function has remained about the same  REVIEW OF SYSTEMS:  Review of Systems  Constitutional: Negative for chills, fever and malaise/fatigue.  HENT: Negative for ear discharge, ear pain and nosebleeds.   Respiratory: Positive for shortness of breath. Negative for cough and wheezing.   Cardiovascular: Positive for palpitations and orthopnea. Negative for chest pain.  Gastrointestinal: Negative for abdominal pain, constipation, diarrhea, nausea and vomiting.  Genitourinary: Negative for dysuria and urgency.       Decreased urination  Musculoskeletal: Negative for myalgias.  Neurological: Negative for dizziness, speech change, focal weakness, seizures and headaches.  Psychiatric/Behavioral: The patient is nervous/anxious.     DRUG ALLERGIES:   Allergies  Allergen Reactions  . Ciprofloxacin Shortness Of Breath, Itching and Rash  . Doxycycline Shortness Of Breath, Itching and Rash  . Penicillins Shortness Of Breath, Itching, Rash and Other (See Comments)    Has patient had a PCN reaction causing immediate rash, facial/tongue/throat swelling, SOB or lightheadedness with hypotension: Yes Has patient had a PCN reaction causing severe rash involving mucus membranes or skin necrosis: No Has patient had a PCN reaction that required hospitalization No Has patient had a PCN reaction occurring within the last 10 years: No If all of the above answers are "NO", then may proceed with Cephalosporin use.  . Sulfa Antibiotics Shortness Of Breath, Itching and Rash  . Morphine And Related Itching  . Albuterol Sulfate   .  Cefuroxime Rash    blisters    VITALS:  Blood pressure (!) 107/59, pulse 96, temperature 97.7 F (36.5 C), temperature source Oral, resp. rate 20, height '5\' 5"'$  (1.651 m), weight 88.1 kg (194 lb 4.8 oz), SpO2 98 %.  PHYSICAL EXAMINATION:  Physical Exam  GENERAL:  82 y.o.-year-old obese patient lying in the bed, complaining of dyspnea, not tachypneic,  EYES: Pupils equal, round, reactive to light and accommodation. No scleral icterus. Extraocular muscles intact.  HEENT: Head atraumatic, normocephalic. Oropharynx and nasopharynx clear.  NECK:  Supple, no jugular venous distention. No thyroid enlargement, no tenderness.  LUNGS: Normal breath sounds bilaterally, no wheezing, rhonchi or crepitation. No use of accessory muscles of respiration. Bibasilar crackles heard. CARDIOVASCULAR: S1, S2 normal. No murmurs, rubs, or gallops.  ABDOMEN: Soft, obese, nontender, nondistended. Bowel sounds present. No organomegaly or mass.  EXTREMITIES: No cyanosis, or clubbing. No pedal edema NEUROLOGIC: Cranial nerves II through XII are intact. Muscle strength 5/5 in all extremities. Sensation intact. Gait not checked. Global weakness PSYCHIATRIC: The patient is alert and oriented x 3.  SKIN: No obvious rash, lesion, or ulcer.    LABORATORY PANEL:   CBC  Recent Labs Lab 03/26/16 0545  WBC 4.4  HGB 7.9*  HCT 24.1*  PLT 132*   ------------------------------------------------------------------------------------------------------------------  Chemistries   Recent Labs Lab 03/26/16 0545  NA 139  K 4.7  CL 99*  CO2 31  GLUCOSE 101*  BUN 79*  CREATININE 3.83*  CALCIUM 8.5*   ------------------------------------------------------------------------------------------------------------------  Cardiac Enzymes No results for input(s): TROPONINI in the last 168 hours. ------------------------------------------------------------------------------------------------------------------  RADIOLOGY:   Dg Chest Port 1 View  Result Date: 03/24/2016 CLINICAL  DATA:  Dyspnea.  History of asthma, COPD, lung cancer. EXAM: PORTABLE CHEST 1 VIEW COMPARISON:  03/20/2016 FINDINGS: Left internal jugular approach central venous catheter unchanged. Cardiomediastinal silhouette is mildly enlarged. Mediastinal contours appear intact. Calcific atherosclerotic disease of the aorta. There is no evidence of pneumothorax. There are persistent bilateral pleural effusions, slightly enlarged from the prior radiograph. Postsurgical/posttreatment changes in the right upper lobe are stable. Stable prominence of the right hilum. Osseous structures are without acute abnormality. Soft tissues are grossly normal. IMPRESSION: Slightly enlarged bilateral pleural effusions with bilateral lower lobe airspace consolidation versus atelectasis. Stable posttreatment changes in the right upper lobe. Stable prominence of the right hilum. Electronically Signed   By: Fidela Salisbury M.D.   On: 03/24/2016 11:27   EKG:   Orders placed or performed during the hospital encounter of 03/04/16  . EKG 12-Lead  . EKG 12-Lead  . EKG 12-Lead  . EKG 12-Lead  . EKG    ASSESSMENT AND PLAN:   82 yo female with a PMH of Type II diabetes, anemia, GERD, cushing's disease, asthma, CKD stage III, pneumonia, edema, paroxysmal atrial fibrillation, HTN, lung cancer s/p radiation 2015, CAD, NSTEMI, HTN, diverticulitis, and gout. She presented to [facility] ER via EMS on 7/20 with c/o worsening shortness of breath.   #1 Acute on chronic respiratory failure with hypoxemia and hypercapnia-secondary to acute on chronic combined CHF exacerbation. - Patient was aggressively diuresed on admission, now creatinine worsening but chest x-ray still showing pleural effusions worse. -Weight is 10 pounds more than previous admission weight - lasix held now -repeat echo ordered and also assessment of right heart pressures. -Continue BiPAP at bedtime and as needed. On 3  L oxygen.  #2 ARF on CKD- baseline cr around 2, now worsened and steady at 3.8, decreased urine output - no acute indication for dialysis yet - foley catheter, nephrology consulted. Hold nephrotoxins -lasix held, monitor closely   #3 COPD-stable at this time. Patient allergic to albuterol and Xopenex. -Continue Atrovent nebs. Not on systemic steroids. -Continue inhalers  #4 chronic atrial fibrillation-with difficult to control heart rate. -Continue amiodarone orally. Also on metoprolol. -Had developed each digitoxin toxicity twice in the past, so no digoxin at this time. -EP referral as outpatient to see if she needs AV nodal ablation. -Failed DC cardioversion in the past -Xarelto restarted as hemoglobin is stable.  #5 acute on chronic anemia-significant iron deficiency and also anemia of chronic disease. -IV iron. Continue outpatient follow-up with cancer center. -No indication for transfusion at this time. Continue to monitor while on Xarelto  #6 diabetes mellitus-on sliding scale insulin  #7 DVT prophylaxis-on Xarelto   If continues to get worse- consider palliative care consultation Discharge plans once kidney function is stabilized     All the records are reviewed and case discussed with Care Management/Social Workerr. Management plans discussed with the patient, family and they are in agreement.  CODE STATUS: DNR  TOTAL TIME TAKING CARE OF THIS PATIENT: 38 minutes.   POSSIBLE D/C IN 1-2 DAYS, DEPENDING ON CLINICAL CONDITION.   Fedora Knisely M.D on 03/26/2016 at 10:48 AM  Between 7am to 6pm - Pager - [phone]  After 6pm go to [email or web address] - password [email or web address] [facility]  Office  [phone]  CC: Primary care physician; Tracie Harrier, MD

## 2016-03-26 NOTE — Progress Notes (Signed)
Patient: Regina Burke / Admit Date: 03/16/2016 / Date of Encounter: 03/26/2016, 9:37 AM   Subjective: Pt feels slightly more SOB today. Wore BIPAP at night. So far, she does not feel the need to go back on BiPAP as yet. No CP. Lasix has been on hold due to worsening renal failure.  Review of Systems: Review of Systems  Constitutional: Positive for malaise/fatigue.  Respiratory: Positive for shortness of breath.   Cardiovascular: Negative.   Gastrointestinal: Negative.   Musculoskeletal: Negative.   Neurological: Positive for weakness.  Psychiatric/Behavioral: Negative.   All other systems reviewed and are negative.   Objective: Telemetry: Atrial fibrillation with rate 80s to low 100s Physical Exam: Blood pressure 108/77, pulse 84, temperature 97.7 F (36.5 C), temperature source Oral, resp. rate 18, height '5\' 5"'$  (1.651 m), weight 194 lb 4.8 oz (88.1 kg), SpO2 98 %. Body mass index is 32.33 kg/m. General: Well developed, well nourished, mild distress, on BiPAP Head: Normocephalic, atraumatic Neck: Negative for carotid bruits. JVP elevated. Lungs: Decreased breath sounds bilaterally with diffuse bilateral crackles.  Heart: Irregularly-irregular S1 S2 without murmurs, rubs, or gallops.  Abdomen: Soft, non-tender, non-distended with normoactive bowel sounds. No rebound/guarding. Extremities: No clubbing or cyanosis. +-++ edema. Distal pedal pulses are 2+ and equal bilaterally. Neuro: Alert and oriented X 3. Moves all extremities spontaneously. Psych:  Responds to questions appropriately with a normal affect.   Intake/Output Summary (Last 24 hours) at 03/26/16 0937 Last data filed at 03/26/16 0419  Gross per 24 hour  Intake              120 ml  Output              450 ml  Net             -330 ml    Inpatient Medications:  . amiodarone  400 mg Oral BID  . ferrous sulfate  325 mg Oral Q breakfast  . fluticasone furoate-vilanterol  1 puff Inhalation Daily  . guaiFENesin   600 mg Oral BID  . insulin aspart  0-15 Units Subcutaneous TID WC  . insulin aspart  0-5 Units Subcutaneous QHS  . metoprolol tartrate  50 mg Oral BID  . pantoprazole  40 mg Oral Q1200  . rivaroxaban  15 mg Oral Q supper  . sertraline  50 mg Oral Daily  . sodium chloride flush  10-40 mL Intracatheter Q12H  . tiotropium  18 mcg Inhalation Daily   Infusions:    Labs:  Recent Labs  03/25/16 0519 03/26/16 0545  NA 139 139  K 5.1 4.7  CL 100* 99*  CO2 32 31  GLUCOSE 138* 101*  BUN 73* 79*  CREATININE 3.88* 3.83*  CALCIUM 8.6* 8.5*   No results for input(s): AST, ALT, ALKPHOS, BILITOT, PROT, ALBUMIN in the last 72 hours.  Recent Labs  03/24/16 0940 03/26/16 0545  WBC 8.5 4.4  HGB 9.3* 7.9*  HCT 27.8* 24.1*  MCV 80.3 81.8  PLT 174 132*   No results for input(s): CKTOTAL, CKMB, TROPONINI in the last 72 hours. Invalid input(s): POCBNP No results for input(s): HGBA1C in the last 72 hours.   Weights: Filed Weights   03/24/16 0551 03/25/16 0401 03/26/16 0419  Weight: 192 lb (87.1 kg) 191 lb 14.4 oz (87 kg) 194 lb 4.8 oz (88.1 kg)     Radiology/Studies:  Dg Chest 1 View  Result Date: 03/04/2016 CLINICAL DATA:  Respiratory distress EXAM: CHEST 1 VIEW COMPARISON:  Chest  radiograph dated 01/24/2016. CT chest dated 01/21/2016. FINDINGS: Patchy bilateral lower lobe opacities, suspicious for pneumonia, less likely atelectasis. Superimposed mild interstitial edema is suspected. Small bilateral pleural effusions. No pneumothorax. Again noted is a stable focal opacity in the medial right upper lobe, most of which likely reflects radiation changes when correlating with prior CT, underlying residual/recurrent tumor not excluded. Cardiomegaly. IMPRESSION: Patchy bilateral lower lobe opacities, suspicious for pneumonia, less likely atelectasis. Superimposed mild interstitial edema is suspected. Small bilateral pleural effusions. Stable focal opacity in the medial right upper lobe, most of  which likely reflects radiation changes when correlating with prior CT, underlying residual/recurrent tumor not excluded. Electronically Signed   By: Julian Hy M.D.   On: 03/04/2016 21:22   Dg Abd 1 View  Result Date: 03/16/2016 CLINICAL DATA:  Evaluate OG tube EXAM: ABDOMEN - 1 VIEW COMPARISON:  None. FINDINGS: The side port of the OG tube is near the GE junction. The distal tip is in the stomach. See the chest x-ray report from the same time for description of the lung bases. No other acute abnormalities. IMPRESSION: The side port of the OG tube is near the GE junction. Consider advancement of the tube further into the stomach. Electronically Signed   By: Dorise Bullion III M.D   On: 03/16/2016 18:28   Ct Chest Wo Contrast  Result Date: 03/07/2016 CLINICAL DATA:  History of COPD with respiratory distress for the past 3 days EXAM: CT CHEST WITHOUT CONTRAST TECHNIQUE: Multidetector CT imaging of the chest was performed following the standard protocol without IV contrast. COMPARISON:  03/04/2016, 01/21/2016 FINDINGS: Cardiovascular: Calcifications of the thoracic aorta are noted without aneurysmal dilatation. The cardiac structures show coronary calcifications. Mediastinum/Nodes: The thoracic inlet demonstrates some nodularity within the right lobe of the thyroid. This is stable from the prior exam. No significant hilar or mediastinal adenopathy is noted. Lungs/Pleura: Bilateral pleural effusions are seen left slightly greater than right. Some associated lower lobe consolidation is noted again left greater than right. These changes are relatively stable from the prior exam. The ground-glass density seen in the left upper lobe is again identified and has improved somewhat in the interval from the prior exam. Scarring in the right upper lobe is again noted stable from the prior exam as well. No new focal infiltrate is seen. Upper Abdomen: Stable cystic lesions are again seen in the spleen. Right  adrenal lesion is again noted and stable. Musculoskeletal: No acute bony abnormality noted. IMPRESSION: Bilateral pleural effusions and associated lower lobe consolidation stable from the prior study. Stable changes in the right upper lobe similar to that seen on the recent exam. Slight improvement in the ground-glass density within the left upper lobe. Electronically Signed   By: Inez Catalina M.D.   On: 03/07/2016 16:36   Dg Chest Port 1 View  Result Date: 03/24/2016 CLINICAL DATA:  Dyspnea.  History of asthma, COPD, lung cancer. EXAM: PORTABLE CHEST 1 VIEW COMPARISON:  03/20/2016 FINDINGS: Left internal jugular approach central venous catheter unchanged. Cardiomediastinal silhouette is mildly enlarged. Mediastinal contours appear intact. Calcific atherosclerotic disease of the aorta. There is no evidence of pneumothorax. There are persistent bilateral pleural effusions, slightly enlarged from the prior radiograph. Postsurgical/posttreatment changes in the right upper lobe are stable. Stable prominence of the right hilum. Osseous structures are without acute abnormality. Soft tissues are grossly normal. IMPRESSION: Slightly enlarged bilateral pleural effusions with bilateral lower lobe airspace consolidation versus atelectasis. Stable posttreatment changes in the right upper lobe. Stable prominence  of the right hilum. Electronically Signed   By: Fidela Salisbury M.D.   On: 03/24/2016 11:27  Dg Chest Port 1 View  Result Date: 03/20/2016 CLINICAL DATA:  Respiratory failure. EXAM: PORTABLE CHEST 1 VIEW COMPARISON:  03/19/2016.  01/23/2016. FINDINGS: Left IJ line in stable position. Cardiomegaly with progressive bilateral pulmonary interstitial prominence and bilateral pleural effusions consistent with progressive congestive heart failure. Persistent density in the right upper lobe without significant interim change and most likely related to radiation change. Surgical clips right upper chest. IMPRESSION: 1.   Left IJ line stable position. 2. Congestive heart failure with bilateral pulmonary interstitial edema and pleural effusions. 3. Persistent density in the right upper lobe most consistent with scarring and/or radiation change. Electronically Signed   By: Marcello Moores  Register   On: 03/20/2016 07:11  Dg Chest Port 1 View  Result Date: 03/19/2016 CLINICAL DATA:  Respiratory failure EXAM: PORTABLE CHEST 1 VIEW COMPARISON:  03/18/2016 FINDINGS: Cardiomegaly. No frank interstitial edema. Moderate left and small right pleural effusions. Associated bilateral lower lobe opacities, likely atelectasis. Stable platelike opacity in the right upper lobe, presumably radiation changes. Left IJ venous catheter terminates in the lower SVC. IMPRESSION: Moderate left and small right pleural effusions. No frank interstitial edema. Electronically Signed   By: Julian Hy M.D.   On: 03/19/2016 11:44  Dg Chest Port 1 View  Result Date: 03/18/2016 CLINICAL DATA:  Respiratory failure. EXAM: PORTABLE CHEST 1 VIEW COMPARISON:  03/17/2016 and CT chest 03/07/2016, 01/21/2016. FINDINGS: Interval extubation. Nasogastric tube is been removed as well. Left IJ central line tip projects over the SVC. Basilar dependent airspace opacification with left lower lobe collapse/ consolidation. Small right pleural effusion and moderate left pleural effusion. Oblong opacity in the apex of the right hemi thorax is seen with fiducial markers, stable. IMPRESSION: 1. Basilar dependent airspace opacification with bilateral pleural effusions. Findings may be due to congestive heart failure. 2. Left lower lobe collapse/consolidation. 3. Oblong opacity with fiducial markers in the right upper lobe, better evaluated on cross-sectional imaging 03/07/2016 and 01/21/2016. Electronically Signed   By: Lorin Picket M.D.   On: 03/18/2016 07:59   Dg Chest Port 1 View  Result Date: 03/17/2016 CLINICAL DATA:  Acute respiratory failure. EXAM: PORTABLE CHEST 1 VIEW  COMPARISON:  Chest radiograph March 16, 2016 FINDINGS: Chest radiograph is enlarged, similar to prior imaging. Calcified aortic knob. Similar pulmonary vascular congestion with increasing RIGHT, similar LEFT small to moderate pleural effusions. Interstitial prominence. Bibasilar consolidation. Similar fullness of the hilum. No pneumothorax. Endotracheal tube tip projects 2.5 cm above the carina. Nasogastric tube past proximal stomach, distal tip not imaged. LEFT internal jugular central venous catheter distal tip projects in mid superior vena cava. Soft tissue planes and included osseous structures are nonsuspicious. Surgical clips project in LEFT upper abdomen. IMPRESSION: Stable cardiomegaly and pulmonary edema with increasing RIGHT, similar LEFT small to moderate pleural effusions. Underlying consolidation. No apparent change in life-support lines. Electronically Signed   By: Elon Alas M.D.   On: 03/17/2016 04:38   Dg Chest Port 1 View  Result Date: 03/16/2016 CLINICAL DATA:  Line placement EXAM: PORTABLE CHEST 1 VIEW COMPARISON:  March 16, 2016 FINDINGS: The ETT is in good position. A new left central line terminates in mid SVC. No pneumothorax is identified. Opacity in the medial right upper lung is similar in the interval. The left-sided pleural effusion with underlying opacity is stable. The effusion and opacity in the right base has improved. No change in cardiomegaly.  No other interval changes. IMPRESSION: A new left IJ is identified in good position with no pneumothorax. The left-sided effusion and opacity is stable while the right-sided effusion and opacity has improved. Electronically Signed   By: Dorise Bullion III M.D   On: 03/16/2016 18:28   Dg Chest Port 1 View  Result Date: 03/16/2016 CLINICAL DATA:  Status post intubation and NG tube placement. Shortness of breath. EXAM: PORTABLE CHEST 1 VIEW COMPARISON:  Single-view of the chest earlier today. FINDINGS: NG tube is seen with the side  port at the gastroesophageal junction and tip just within the stomach. The tube should be advanced approximately 5 cm for better positioning. New endotracheal tube is in place with the tip in good position at the level of the clavicular heads. Bilateral effusions and airspace disease persist without marked change since the study earlier today. IMPRESSION: ET tube in good position. NG tube should be advanced approximately 5 cm for better positioning. Bilateral pleural effusions and basilar airspace disease. Electronically Signed   By: Inge Rise M.D.   On: 03/16/2016 15:52   Dg Chest Port 1 View  Result Date: 03/16/2016 CLINICAL DATA:  Difficulty breathing today. EXAM: PORTABLE CHEST 1 VIEW COMPARISON:  Single-view of the chest 03/10/2016 and 03/04/2016. CT chest 03/07/2016. FINDINGS: Moderate bilateral pleural effusions have increased in size since the most recent examination. Associated basilar atelectasis is noted. There is cardiomegaly and mild interstitial edema. IMPRESSION: Increased moderate bilateral pleural effusions and basilar atelectasis. Cardiomegaly and mild interstitial edema. Electronically Signed   By: Inge Rise M.D.   On: 03/16/2016 14:38   Dg Chest Port 1 View  Result Date: 03/10/2016 CLINICAL DATA:  Acute respiratory failure, asthma -COPD, CHF, coronary artery disease EXAM: PORTABLE CHEST 1 VIEW COMPARISON:  Portable chest x-ray of March 04, 2016 and chest CT scan of March 07, 2016 FINDINGS: The lungs are well-expanded. There remain bilateral pleural effusions which have increased slightly in volume. The pulmonary vascularity is less engorged. The interstitial markings are less prominent. The cardiac silhouette remains enlarged. There is calcification in the wall of the aortic arch. IMPRESSION: Mild interval improvement in pulmonary interstitial edema and bilateral pleural effusions. Persistent left basilar atelectasis or pneumonia. Electronically Signed   By: David  Martinique M.D.    On: 03/10/2016 07:51   [place] 12/23/2014: Left main: Short segment. No obstructive disease.   Left Anterior Descending Artery: Large caliber vessel that courses to the apex. The proximal vessel has diffuse mild plaque. There is a 50% stenosis in the mid vessel just before the takeoff of the first diagonal branch. The remainder of the mid vessel has mild diffuse plaque. The distal vessel has mild diffuse plaque. The first diagonal branch is a moderate caliber vessel with mid 30% stenosis. The second major diagonal branch is a small caliber vessel with proximal 40% stenosis.   Circumflex Artery: Large caliber vessel with 3 obtuse marginal branches. The AV groove Circumflex has mild plaque in the proximal and mid segments. The first obtuse marginal branch is a moderate caliber vessel with proximal 20% stenosis. The second obtuse marginal branch is a moderate caliber vessel with mid 20% stenosis. The third obtuse marginal branch is a moderate caliber vessel with proximal 90% hazy stenosis. The mid segment of OM3 has a 50% stenosis.   Right Coronary Artery: Moderate caliber non-dominant vessel with no obstructive disease.   Left Ventricular Angiogram: Deferred.   Impression: 1. Severe single vessel CAD 2. Severe stenosis OM3 3. Unstable angina  4. Successful PTCA/DES x 1 OM3  Recommendations: She will need dual anti-platelet therapy with ASA and Plavix for at least 12 months. Continue beta blocker and statin.   Limited echo 03/25/2016: Left ventricle: The cavity size was normal. There was mild   concentric hypertrophy. Rhythm still atrial fibrillation with VR   in 100-110s. Study used definity contrast for LV opacifcation.   Systolic function appears moderately reduced. The estimated   ejection fraction was in the range of 35% to 40%. Abnormal septal   motion due to LBBB appears to contribute to this significantly. - Ventricular septum: Septal motion showed abnormal function and   paradox.  These changes are consistent with a left bundle branch   block. - Left atrium: The atrium was severely dilated. - Right atrium: The atrium was moderately dilated. - Tricuspid valve: There was moderate-severe regurgitation. - Pulmonary arteries: Systolic pressure was moderately increased. - Pericardium, extracardiac: There was a left pleural effusion.    Assessment and Plan  81 y.o. female   1. Acute on chronic respiratory failure with hypoxia and hypercapnia: -Multifactorial including COPD exacerbation  (acute on chronic combined CHF, exacerbated by atrial fibrillation) On BiPAP for support   2. 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 LHC from 11/2014 showed single vessel cad treated with PCI .  EF was verified on limited echo with contrast. Previously, PA SP were severely elevated. On most recent one, only slightly decreased. Pulmonary hypertension is also likely also multifactorial - pulmonary venous, and also secondary to intrinsic lung disease. Physical examination reveals evidence of volume overload. Weight has gone up compared to previous admission, roughly 10 pounds. Await input today from nephrology. ?Lasix infusion (If after adequate diuresis and rate control achieved, consider rpt echo to ffup EF.)  3. Chronic Afib with RVR s/p DCCV last admission: difficult to control and is about as well controlled as we will likely be able to obtain -She did not maintain sinus rhythm s/p recent DCCV, thus the chance of her maintaining sinus rhythm is quite low We'll recommend outpatient EP clinic follow-up. Possible AVN ablation with PPM  amiodarone 400 mg bid with metoprolol 50 mg 3 times a day. Heart rate slightly improved. Continue Xarelto -CHADS2VASc at least 7 (CHF, HTN, age x 2, DM, vascular disease, sex category)  4. CAD s/p PCI 11/2014 -No symptoms concerning for angina -Continue current medical therapy   5. HTN BP wnl.  Continue to monitor.   6. Acute anemia: Scheduled to see hematology as an outpatient As no blood loss anemia, could restart anticoagulation  7. Acute on CKD stage III: Await nephrology input    Total encounter time more than 35 minutes  Greater than 50% was spent in counseling and coordination of care with the patient  Discussed recommendations with Dr. Tressia Miners. Patient's son in room during this encounter.   Signed, Wende Bushy, MD, [place]

## 2016-03-26 NOTE — Progress Notes (Signed)
[facility]  ROUNDING NOTE   Subjective:  Renal function remains quite low at the moment. BUN up to 79 with a creatinine of 3.8. Case discussed with cardiology who feels that the patient remains volume overloaded overall. We discussed reinstituting Lasix in the form of Lasix drip.   Objective:  Vital signs in last 24 hours:  Temp:  [97.7 F (36.5 C)-98.3 F (36.8 C)] 97.7 F (36.5 C) (07/30 0419) Pulse Rate:  [84-96] 96 (07/30 1043) Resp:  [18-20] 20 (07/30 1043) BP: (101-108)/(59-77) 107/59 (07/30 1043) SpO2:  [92 %-98 %] 98 % (07/30 1043) FiO2 (%):  [32 %] 32 % (07/29 2017) Weight:  [88.1 kg (194 lb 4.8 oz)] 88.1 kg (194 lb 4.8 oz) (07/30 0419)  Weight change: 1.089 kg (2 lb 6.4 oz) Filed Weights   03/24/16 0551 03/25/16 0401 03/26/16 0419  Weight: 87.1 kg (192 lb) 87 kg (191 lb 14.4 oz) 88.1 kg (194 lb 4.8 oz)    Intake/Output: I/O last 3 completed shifts: In: 120 [P.O.:120] Out: 800 [Urine:800]   Intake/Output this shift:  Total I/O In: 270 [P.O.:240; I.V.:30] Out: -   Physical Exam: General: Currently on bipap  Head: Normocephalic, atraumatic. bipap facemask on  Eyes: Anicteric  Neck: Supple, trachea midline  Lungs:  Bilateral rhonchi, on bipap  Heart: S1S2 no rubs  Abdomen:  Soft, nontender, BS present   Extremities:  peripheral edema.  Neurologic: Nonfocal, moving all four extremities  Skin: No lesions  Access:     Basic Metabolic Panel:  Recent Labs Lab 03/22/16 0404 03/23/16 1000 03/24/16 1051 03/25/16 0519 03/26/16 0545  NA 138 139 137 139 139  K 3.7 4.2 4.5 5.1 4.7  CL 95* 97* 97* 100* 99*  CO2 '31 31 30 '$ 32 31  GLUCOSE 150* 161* 179* 138* 101*  BUN 51* 63* 67* 73* 79*  CREATININE 2.52* 3.25* 3.51* 3.88* 3.83*  CALCIUM 8.3* 8.7* 8.5* 8.6* 8.5*    Liver Function Tests: No results for input(s): AST, ALT, ALKPHOS, BILITOT, PROT, ALBUMIN in the last 168 hours. No results for input(s): LIPASE, AMYLASE in the last 168  hours. No results for input(s): AMMONIA in the last 168 hours.  CBC:  Recent Labs Lab 03/21/16 0508 03/23/16 1000 03/24/16 0940 03/26/16 0545  WBC 5.6 8.5 8.5 4.4  NEUTROABS 4.4  --   --   --   HGB 8.3* 9.3* 9.3* 7.9*  HCT 24.8* 27.9* 27.8* 24.1*  MCV 80.3 79.4* 80.3 81.8  PLT 140* 182 174 132*    Cardiac Enzymes: No results for input(s): CKTOTAL, CKMB, CKMBINDEX, TROPONINI in the last 168 hours.  BNP: Invalid input(s): POCBNP  CBG:  Recent Labs Lab 03/25/16 1157 03/25/16 1638 03/25/16 2106 03/26/16 0741 03/26/16 1207  GLUCAP 131* 117* 162* 105* 131*    Microbiology: Results for orders placed or performed during the hospital encounter of 03/16/16  Culture, blood (routine x 2)     Status: None   Collection Time: 03/16/16  6:12 PM  Result Value Ref Range Status   Specimen Description BLOOD RIGHT HAND  Final   Special Requests BOTTLES DRAWN AEROBIC AND ANAEROBIC 6CC  Final   Culture NO GROWTH 5 DAYS  Final   Report Status 03/21/2016 FINAL  Final  Culture, blood (routine x 2)     Status: None   Collection Time: 03/16/16  6:40 PM  Result Value Ref Range Status   Specimen Description BLOOD LEFT WRIST  Final   Special Requests BOTTLES DRAWN AEROBIC AND  ANAEROBIC 8CC  Final   Culture NO GROWTH 5 DAYS  Final   Report Status 03/21/2016 FINAL  Final    Coagulation Studies: No results for input(s): LABPROT, INR in the last 72 hours.  Urinalysis: No results for input(s): COLORURINE, LABSPEC, PHURINE, GLUCOSEU, HGBUR, BILIRUBINUR, KETONESUR, PROTEINUR, UROBILINOGEN, NITRITE, LEUKOCYTESUR in the last 72 hours.  Invalid input(s): APPERANCEUR    Imaging: No results found.   Medications:     . amiodarone  400 mg Oral BID  . ferrous sulfate  325 mg Oral Q breakfast  . fluticasone furoate-vilanterol  1 puff Inhalation Daily  . guaiFENesin  600 mg Oral BID  . insulin aspart  0-15 Units Subcutaneous TID WC  . insulin aspart  0-5 Units Subcutaneous QHS  .  metoprolol tartrate  50 mg Oral BID  . pantoprazole  40 mg Oral Q1200  . rivaroxaban  15 mg Oral Q supper  . sertraline  50 mg Oral Daily  . sodium chloride flush  10-40 mL Intracatheter Q12H  . tiotropium  18 mcg Inhalation Daily   sodium chloride, ALPRAZolam, guaiFENesin, ipratropium, metoprolol, ondansetron (ZOFRAN) IV, sodium chloride flush  Assessment/ Plan:  82 y.o. female  with diabetes mellitus type II, coronary artery disease, atrial fibrillation, hypertension, hyperlipidemia, generalized anxiety disorder, gout, anemia, history of lung cancer, COPD, atrial fibrillation, fatty liver disease, GERD, osteoporosis   1. Acute Renal Failure on Chronic kidney disease stage III and proteinuria: Baseline creatinine of 1.5, eGFR of 32.  Acute renal failure from cardiorenal syndrome and then overdiuresis - Case discussed with cardiology today. They feel that the patient remains volume overloaded. We had previously placed Lasix on hold given worsening renal function however given concerns of ongoing volume overload we will restart the patient on Lasix drip as discussed with cardiology. If renal function continues to worsen or the patient does not have a good response we may need to consider temporary renal replacement therapy which was discussed with the patient.  2. Acute respiratory failure secondary to acute systolic heart failure.  Patient has low EF of 35-40%. We will restart diuresis as above.   LOS: [place], Jennfier Abdulla 7/30/20171:15 PM

## 2016-03-26 NOTE — Consult Note (Signed)
ANTICOAGULATION CONSULT NOTE - Follow Up Consult  Pharmacy Consult for xarelto Indication: atrial fibrillation  Allergies  Allergen Reactions  . Ciprofloxacin Shortness Of Breath, Itching and Rash  . Doxycycline Shortness Of Breath, Itching and Rash  . Penicillins Shortness Of Breath, Itching, Rash and Other (See Comments)    Has patient had a PCN reaction causing immediate rash, facial/tongue/throat swelling, SOB or lightheadedness with hypotension: Yes Has patient had a PCN reaction causing severe rash involving mucus membranes or skin necrosis: No Has patient had a PCN reaction that required hospitalization No Has patient had a PCN reaction occurring within the last 10 years: No If all of the above answers are "NO", then may proceed with Cephalosporin use.  . Sulfa Antibiotics Shortness Of Breath, Itching and Rash  . Morphine And Related Itching  . Albuterol Sulfate   . Cefuroxime Rash    blisters    Patient Measurements: Height: '5\' 5"'$  (165.1 cm) Weight: 194 lb 4.8 oz (88.1 kg) IBW/kg (Calculated) : 57 Heparin Dosing Weight:   Vital Signs: Temp: 97.7 F (36.5 C) (07/30 0419) Temp Source: Oral (07/30 0419) BP: 112/76 (07/30 1356) Pulse Rate: 107 (07/30 1356)  Labs:  Recent Labs  03/24/16 0940 03/24/16 1051 03/25/16 0519 03/26/16 0545  HGB 9.3*  --   --  7.9*  HCT 27.8*  --   --  24.1*  PLT 174  --   --  132*  CREATININE  --  3.51* 3.88* 3.83*    Estimated Creatinine Clearance: 12.8 mL/min (by C-G formula based on SCr of 3.83 mg/dL).   Medications:  Scheduled:  . amiodarone  400 mg Oral BID  . ferrous sulfate  325 mg Oral Q breakfast  . fluticasone furoate-vilanterol  1 puff Inhalation Daily  . guaiFENesin  600 mg Oral BID  . insulin aspart  0-15 Units Subcutaneous TID WC  . insulin aspart  0-5 Units Subcutaneous QHS  . metoprolol tartrate  50 mg Oral BID  . pantoprazole  40 mg Oral Q1200  . rivaroxaban  15 mg Oral Q supper  . sertraline  50 mg Oral  Daily  . sodium chloride flush  10-40 mL Intracatheter Q12H  . tiotropium  18 mcg Inhalation Daily    Assessment: Pt is an 82 year old female with Afib CHADS2VASc at least 7 (CHF, HTN, age x 2, DM, vascular disease, sex category). Pharmacy consulted to continue [place]. Pt has had a decline in renal function, however crcl using ABW (per protocol) remains above 65m/min  Goal of Therapy:   Monitor platelets by anticoagulation protocol: Yes   Plan:  Continue xarelto '15mg'$  daily at supper  7/30- Crcl with total body weight= 16.2 ml/min.  Scr=3.83   (Wt 88.1kg). Will continue Xarelto '15mg'$  daily with supper.    KChinita GreenlandPharmD Clinical Pharmacist 03/26/2016

## 2016-03-27 ENCOUNTER — Inpatient Hospital Stay: Payer: Medicare Other

## 2016-03-27 LAB — BASIC METABOLIC PANEL
ANION GAP: 8 (ref 5–15)
BUN: 80 mg/dL — AB (ref 6–20)
CALCIUM: 8.5 mg/dL — AB (ref 8.9–10.3)
CO2: 31 mmol/L (ref 22–32)
Chloride: 100 mmol/L — ABNORMAL LOW (ref 101–111)
Creatinine, Ser: 3.72 mg/dL — ABNORMAL HIGH (ref 0.44–1.00)
GFR calc Af Amer: 12 mL/min — ABNORMAL LOW (ref >60)
GFR, EST NON AFRICAN AMERICAN: 11 mL/min — AB (ref >60)
GLUCOSE: 140 mg/dL — AB (ref 65–99)
Potassium: 4.8 mmol/L (ref 3.5–5.1)
SODIUM: 139 mmol/L (ref 135–145)

## 2016-03-27 LAB — CBC
HEMATOCRIT: 26 % — AB (ref 35.0–47.0)
Hemoglobin: 8.6 g/dL — ABNORMAL LOW (ref 12.0–16.0)
MCH: 27.2 pg (ref 26.0–34.0)
MCHC: 33.2 g/dL (ref 32.0–36.0)
MCV: 81.8 fL (ref 80.0–100.0)
Platelets: 156 10*3/uL (ref 150–440)
RBC: 3.18 MIL/uL — AB (ref 3.80–5.20)
RDW: 18.6 % — ABNORMAL HIGH (ref 11.5–14.5)
WBC: 5.4 10*3/uL (ref 3.6–11.0)

## 2016-03-27 LAB — GLUCOSE, CAPILLARY
GLUCOSE-CAPILLARY: 142 mg/dL — AB (ref 65–99)
GLUCOSE-CAPILLARY: 103 mg/dL — AB (ref 65–99)
GLUCOSE-CAPILLARY: 147 mg/dL — AB (ref 65–99)
Glucose-Capillary: 181 mg/dL — ABNORMAL HIGH (ref 65–99)

## 2016-03-27 MED ORDER — APIXABAN 2.5 MG PO TABS
2.5000 mg | ORAL_TABLET | Freq: Two times a day (BID) | ORAL | Status: DC
  Administered 2016-03-27 – 2016-03-30 (×6): 2.5 mg via ORAL
  Filled 2016-03-27 (×6): qty 1

## 2016-03-27 NOTE — Clinical Social Work Note (Signed)
CSW met with patient to confirm that she still would like to go to [facility].  Patient stated yes she is planning to go to SNF once she is medically ready for discharge and orders have been received.  Jones Broom. Roe, MSW, [place] 03/27/2016 10:27 AM

## 2016-03-27 NOTE — Care Management (Signed)
Barrier to discharge: Lasix gtt. Renal function worse. Nephrology consult.

## 2016-03-27 NOTE — Progress Notes (Addendum)
Patient: Regina Burke / Admit Date: 03/16/2016 / Date of Encounter: 03/27/2016, 7:39 AM   Subjective: Restarted on Lasix gtt on 7/30 given persistent volume overload and SOB. Renal function stable. On BiPAP overnight and while sleeping this morning.   Review of Systems: Review of Systems  Constitutional: Positive for malaise/fatigue.  Respiratory: Positive for shortness of breath.   Cardiovascular: Negative.   Gastrointestinal: Negative.   Musculoskeletal: Negative.   Neurological: Positive for weakness.  Psychiatric/Behavioral: Negative.   All other systems reviewed and are negative.   Objective: Telemetry: Atrial fibrillation with rate 80s to low 100s Physical Exam: Blood pressure 109/68, pulse (!) 101, temperature 98.3 F (36.8 C), temperature source Oral, resp. rate 18, height '5\' 5"'$  (1.651 m), weight 192 lb 3.2 oz (87.2 kg), SpO2 94 %. Body mass index is 31.98 kg/m. General: Well developed, well nourished, mild distress, on BiPAP Head: Normocephalic, atraumatic Neck: Negative for carotid bruits. JVP elevated. Lungs: Decreased breath sounds bilaterally with diffuse bilateral crackles.  Heart: Irregularly-irregular S1 S2 without murmurs, rubs, or gallops.  Abdomen: Soft, non-tender, non-distended with normoactive bowel sounds. No rebound/guarding. Extremities: No clubbing or cyanosis. +-++ edema. Distal pedal pulses are 2+ and equal bilaterally. Neuro: Alert and oriented X 3. Moves all extremities spontaneously. Psych:  Responds to questions appropriately with a normal affect.   Intake/Output Summary (Last 24 hours) at 03/27/16 0739 Last data filed at 03/27/16 0437  Gross per 24 hour  Intake           815.25 ml  Output             1075 ml  Net          -259.75 ml    Inpatient Medications:  . amiodarone  400 mg Oral BID  . ferrous sulfate  325 mg Oral Q breakfast  . fluticasone furoate-vilanterol  1 puff Inhalation Daily  . guaiFENesin  600 mg Oral BID  .  insulin aspart  0-15 Units Subcutaneous TID WC  . insulin aspart  0-5 Units Subcutaneous QHS  . metoprolol tartrate  50 mg Oral BID  . pantoprazole  40 mg Oral Q1200  . rivaroxaban  15 mg Oral Q supper  . sertraline  50 mg Oral Daily  . sodium chloride flush  10-40 mL Intracatheter Q12H  . tiotropium  18 mcg Inhalation Daily   Infusions:  . furosemide (LASIX) infusion 5 mg/hr (03/26/16 1357)    Labs:  Recent Labs  03/26/16 0545 03/27/16 0518  NA 139 139  K 4.7 4.8  CL 99* 100*  CO2 31 31  GLUCOSE 101* 140*  BUN 79* 80*  CREATININE 3.83* 3.72*  CALCIUM 8.5* 8.5*   No results for input(s): AST, ALT, ALKPHOS, BILITOT, PROT, ALBUMIN in the last 72 hours.  Recent Labs  03/24/16 0940 03/26/16 0545  WBC 8.5 4.4  HGB 9.3* 7.9*  HCT 27.8* 24.1*  MCV 80.3 81.8  PLT 174 132*   No results for input(s): CKTOTAL, CKMB, TROPONINI in the last 72 hours. Invalid input(s): POCBNP No results for input(s): HGBA1C in the last 72 hours.   Weights: Filed Weights   03/25/16 0401 03/26/16 0419 03/27/16 0437  Weight: 191 lb 14.4 oz (87 kg) 194 lb 4.8 oz (88.1 kg) 192 lb 3.2 oz (87.2 kg)     Radiology/Studies:  Dg Chest 1 View  Result Date: 03/04/2016 CLINICAL DATA:  Respiratory distress EXAM: CHEST 1 VIEW COMPARISON:  Chest radiograph dated 01/24/2016. CT chest dated 01/21/2016. FINDINGS:  Patchy bilateral lower lobe opacities, suspicious for pneumonia, less likely atelectasis. Superimposed mild interstitial edema is suspected. Small bilateral pleural effusions. No pneumothorax. Again noted is a stable focal opacity in the medial right upper lobe, most of which likely reflects radiation changes when correlating with prior CT, underlying residual/recurrent tumor not excluded. Cardiomegaly. IMPRESSION: Patchy bilateral lower lobe opacities, suspicious for pneumonia, less likely atelectasis. Superimposed mild interstitial edema is suspected. Small bilateral pleural effusions. Stable focal  opacity in the medial right upper lobe, most of which likely reflects radiation changes when correlating with prior CT, underlying residual/recurrent tumor not excluded. Electronically Signed   By: Julian Hy M.D.   On: 03/04/2016 21:22   Dg Abd 1 View  Result Date: 03/16/2016 CLINICAL DATA:  Evaluate OG tube EXAM: ABDOMEN - 1 VIEW COMPARISON:  None. FINDINGS: The side port of the OG tube is near the GE junction. The distal tip is in the stomach. See the chest x-ray report from the same time for description of the lung bases. No other acute abnormalities. IMPRESSION: The side port of the OG tube is near the GE junction. Consider advancement of the tube further into the stomach. Electronically Signed   By: Dorise Bullion III M.D   On: 03/16/2016 18:28   Ct Chest Wo Contrast  Result Date: 03/07/2016 CLINICAL DATA:  History of COPD with respiratory distress for the past 3 days EXAM: CT CHEST WITHOUT CONTRAST TECHNIQUE: Multidetector CT imaging of the chest was performed following the standard protocol without IV contrast. COMPARISON:  03/04/2016, 01/21/2016 FINDINGS: Cardiovascular: Calcifications of the thoracic aorta are noted without aneurysmal dilatation. The cardiac structures show coronary calcifications. Mediastinum/Nodes: The thoracic inlet demonstrates some nodularity within the right lobe of the thyroid. This is stable from the prior exam. No significant hilar or mediastinal adenopathy is noted. Lungs/Pleura: Bilateral pleural effusions are seen left slightly greater than right. Some associated lower lobe consolidation is noted again left greater than right. These changes are relatively stable from the prior exam. The ground-glass density seen in the left upper lobe is again identified and has improved somewhat in the interval from the prior exam. Scarring in the right upper lobe is again noted stable from the prior exam as well. No new focal infiltrate is seen. Upper Abdomen: Stable cystic  lesions are again seen in the spleen. Right adrenal lesion is again noted and stable. Musculoskeletal: No acute bony abnormality noted. IMPRESSION: Bilateral pleural effusions and associated lower lobe consolidation stable from the prior study. Stable changes in the right upper lobe similar to that seen on the recent exam. Slight improvement in the ground-glass density within the left upper lobe. Electronically Signed   By: Inez Catalina M.D.   On: 03/07/2016 16:36   Dg Chest Port 1 View  Result Date: 03/24/2016 CLINICAL DATA:  Dyspnea.  History of asthma, COPD, lung cancer. EXAM: PORTABLE CHEST 1 VIEW COMPARISON:  03/20/2016 FINDINGS: Left internal jugular approach central venous catheter unchanged. Cardiomediastinal silhouette is mildly enlarged. Mediastinal contours appear intact. Calcific atherosclerotic disease of the aorta. There is no evidence of pneumothorax. There are persistent bilateral pleural effusions, slightly enlarged from the prior radiograph. Postsurgical/posttreatment changes in the right upper lobe are stable. Stable prominence of the right hilum. Osseous structures are without acute abnormality. Soft tissues are grossly normal. IMPRESSION: Slightly enlarged bilateral pleural effusions with bilateral lower lobe airspace consolidation versus atelectasis. Stable posttreatment changes in the right upper lobe. Stable prominence of the right hilum. Electronically Signed  By: Fidela Salisbury M.D.   On: 03/24/2016 11:27  Dg Chest Port 1 View  Result Date: 03/20/2016 CLINICAL DATA:  Respiratory failure. EXAM: PORTABLE CHEST 1 VIEW COMPARISON:  03/19/2016.  01/23/2016. FINDINGS: Left IJ line in stable position. Cardiomegaly with progressive bilateral pulmonary interstitial prominence and bilateral pleural effusions consistent with progressive congestive heart failure. Persistent density in the right upper lobe without significant interim change and most likely related to radiation change.  Surgical clips right upper chest. IMPRESSION: 1.  Left IJ line stable position. 2. Congestive heart failure with bilateral pulmonary interstitial edema and pleural effusions. 3. Persistent density in the right upper lobe most consistent with scarring and/or radiation change. Electronically Signed   By: Marcello Moores  Register   On: 03/20/2016 07:11  Dg Chest Port 1 View  Result Date: 03/19/2016 CLINICAL DATA:  Respiratory failure EXAM: PORTABLE CHEST 1 VIEW COMPARISON:  03/18/2016 FINDINGS: Cardiomegaly. No frank interstitial edema. Moderate left and small right pleural effusions. Associated bilateral lower lobe opacities, likely atelectasis. Stable platelike opacity in the right upper lobe, presumably radiation changes. Left IJ venous catheter terminates in the lower SVC. IMPRESSION: Moderate left and small right pleural effusions. No frank interstitial edema. Electronically Signed   By: Julian Hy M.D.   On: 03/19/2016 11:44  Dg Chest Port 1 View  Result Date: 03/18/2016 CLINICAL DATA:  Respiratory failure. EXAM: PORTABLE CHEST 1 VIEW COMPARISON:  03/17/2016 and CT chest 03/07/2016, 01/21/2016. FINDINGS: Interval extubation. Nasogastric tube is been removed as well. Left IJ central line tip projects over the SVC. Basilar dependent airspace opacification with left lower lobe collapse/ consolidation. Small right pleural effusion and moderate left pleural effusion. Oblong opacity in the apex of the right hemi thorax is seen with fiducial markers, stable. IMPRESSION: 1. Basilar dependent airspace opacification with bilateral pleural effusions. Findings may be due to congestive heart failure. 2. Left lower lobe collapse/consolidation. 3. Oblong opacity with fiducial markers in the right upper lobe, better evaluated on cross-sectional imaging 03/07/2016 and 01/21/2016. Electronically Signed   By: Lorin Picket M.D.   On: 03/18/2016 07:59   Dg Chest Port 1 View  Result Date: 03/17/2016 CLINICAL DATA:  Acute  respiratory failure. EXAM: PORTABLE CHEST 1 VIEW COMPARISON:  Chest radiograph March 16, 2016 FINDINGS: Chest radiograph is enlarged, similar to prior imaging. Calcified aortic knob. Similar pulmonary vascular congestion with increasing RIGHT, similar LEFT small to moderate pleural effusions. Interstitial prominence. Bibasilar consolidation. Similar fullness of the hilum. No pneumothorax. Endotracheal tube tip projects 2.5 cm above the carina. Nasogastric tube past proximal stomach, distal tip not imaged. LEFT internal jugular central venous catheter distal tip projects in mid superior vena cava. Soft tissue planes and included osseous structures are nonsuspicious. Surgical clips project in LEFT upper abdomen. IMPRESSION: Stable cardiomegaly and pulmonary edema with increasing RIGHT, similar LEFT small to moderate pleural effusions. Underlying consolidation. No apparent change in life-support lines. Electronically Signed   By: Elon Alas M.D.   On: 03/17/2016 04:38   Dg Chest Port 1 View  Result Date: 03/16/2016 CLINICAL DATA:  Line placement EXAM: PORTABLE CHEST 1 VIEW COMPARISON:  March 16, 2016 FINDINGS: The ETT is in good position. A new left central line terminates in mid SVC. No pneumothorax is identified. Opacity in the medial right upper lung is similar in the interval. The left-sided pleural effusion with underlying opacity is stable. The effusion and opacity in the right base has improved. No change in cardiomegaly. No other interval changes. IMPRESSION: A new left  IJ is identified in good position with no pneumothorax. The left-sided effusion and opacity is stable while the right-sided effusion and opacity has improved. Electronically Signed   By: Dorise Bullion III M.D   On: 03/16/2016 18:28   Dg Chest Port 1 View  Result Date: 03/16/2016 CLINICAL DATA:  Status post intubation and NG tube placement. Shortness of breath. EXAM: PORTABLE CHEST 1 VIEW COMPARISON:  Single-view of the chest  earlier today. FINDINGS: NG tube is seen with the side port at the gastroesophageal junction and tip just within the stomach. The tube should be advanced approximately 5 cm for better positioning. New endotracheal tube is in place with the tip in good position at the level of the clavicular heads. Bilateral effusions and airspace disease persist without marked change since the study earlier today. IMPRESSION: ET tube in good position. NG tube should be advanced approximately 5 cm for better positioning. Bilateral pleural effusions and basilar airspace disease. Electronically Signed   By: Inge Rise M.D.   On: 03/16/2016 15:52   Dg Chest Port 1 View  Result Date: 03/16/2016 CLINICAL DATA:  Difficulty breathing today. EXAM: PORTABLE CHEST 1 VIEW COMPARISON:  Single-view of the chest 03/10/2016 and 03/04/2016. CT chest 03/07/2016. FINDINGS: Moderate bilateral pleural effusions have increased in size since the most recent examination. Associated basilar atelectasis is noted. There is cardiomegaly and mild interstitial edema. IMPRESSION: Increased moderate bilateral pleural effusions and basilar atelectasis. Cardiomegaly and mild interstitial edema. Electronically Signed   By: Inge Rise M.D.   On: 03/16/2016 14:38   Dg Chest Port 1 View  Result Date: 03/10/2016 CLINICAL DATA:  Acute respiratory failure, asthma -COPD, CHF, coronary artery disease EXAM: PORTABLE CHEST 1 VIEW COMPARISON:  Portable chest x-ray of March 04, 2016 and chest CT scan of March 07, 2016 FINDINGS: The lungs are well-expanded. There remain bilateral pleural effusions which have increased slightly in volume. The pulmonary vascularity is less engorged. The interstitial markings are less prominent. The cardiac silhouette remains enlarged. There is calcification in the wall of the aortic arch. IMPRESSION: Mild interval improvement in pulmonary interstitial edema and bilateral pleural effusions. Persistent left basilar atelectasis or  pneumonia. Electronically Signed   By: David  Martinique M.D.   On: 03/10/2016 07:51   [place] 12/23/2014: Left main: Short segment. No obstructive disease.   Left Anterior Descending Artery: Large caliber vessel that courses to the apex. The proximal vessel has diffuse mild plaque. There is a 50% stenosis in the mid vessel just before the takeoff of the first diagonal branch. The remainder of the mid vessel has mild diffuse plaque. The distal vessel has mild diffuse plaque. The first diagonal branch is a moderate caliber vessel with mid 30% stenosis. The second major diagonal branch is a small caliber vessel with proximal 40% stenosis.   Circumflex Artery: Large caliber vessel with 3 obtuse marginal branches. The AV groove Circumflex has mild plaque in the proximal and mid segments. The first obtuse marginal branch is a moderate caliber vessel with proximal 20% stenosis. The second obtuse marginal branch is a moderate caliber vessel with mid 20% stenosis. The third obtuse marginal branch is a moderate caliber vessel with proximal 90% hazy stenosis. The mid segment of OM3 has a 50% stenosis.   Right Coronary Artery: Moderate caliber non-dominant vessel with no obstructive disease.   Left Ventricular Angiogram: Deferred.   Impression: 1. Severe single vessel CAD 2. Severe stenosis OM3 3. Unstable angina 4. Successful PTCA/DES x 1 OM3  Recommendations:  She will need dual anti-platelet therapy with ASA and Plavix for at least 12 months. Continue beta blocker and statin.   Limited echo 03/25/2016: Left ventricle: The cavity size was normal. There was mild   concentric hypertrophy. Rhythm still atrial fibrillation with VR   in 100-110s. Study used definity contrast for LV opacifcation.   Systolic function appears moderately reduced. The estimated   ejection fraction was in the range of 35% to 40%. Abnormal septal   motion due to LBBB appears to contribute to this significantly. - Ventricular  septum: Septal motion showed abnormal function and   paradox. These changes are consistent with a left bundle branch   block. - Left atrium: The atrium was severely dilated. - Right atrium: The atrium was moderately dilated. - Tricuspid valve: There was moderate-severe regurgitation. - Pulmonary arteries: Systolic pressure was moderately increased. - Pericardium, extracardiac: There was a left pleural effusion.    Assessment and Plan    1. Acute on chronic respiratory failure with hypoxia and hypercapnia: Multifactorial including COPD exacerbation  (acute on chronic combined CHF, exacerbated by atrial fibrillation) On BiPAP for support   2. 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 LHC from 11/2014 showed single vessel cad treated with PCI .  EF was verified on limited echo with contrast. Previously, PASP were severely elevated. On most recent one, only slightly decreased. Pulmonary hypertension is also likely also multifactorial - pulmonary venous, and also secondary to intrinsic lung disease. Physical examination reveals evidence of volume overload. Weight has gone up compared to previous admission, roughly 10 pounds. Back on Lasix gtt as of 7/30 with a UOP of 260 mL for the past 24 hours She remains with + fluids for the admission Continue Lasix gtt and reevaluate 8/1  3. Chronic Afib with RVR s/p DCCV last admission: Currently well controlled with heart rates in the 80 bpm Has been difficult to control and is about as well controlled as we will likely be able to obtain She did not maintain sinus rhythm s/p recent DCCV, thus the chance of her maintaining sinus rhythm is quite low We'll recommend outpatient EP clinic follow-up. Possible AVN ablation with PPM  amiodarone 400 mg bid with metoprolol 50 mg 3 times a day. Heart rate slightly improved. Continue Xarelto CHADS2VASc at least 7 (CHF, HTN, age x 2, DM, vascular disease, sex  category)  4. CAD s/p PCI 11/2014 No symptoms concerning for angina Continue current medical therapy   5. HTN BP wnl. Continue to monitor.   6. Acute anemia: Scheduled to see hematology as an outpatient As no blood loss anemia, could restart anticoagulation  7. Acute on CKD stage III: Stable Renal on board   Signed, Christell Faith, PA-C [facility]    I have seen and examined the patient. I agree with the above note with the addition of : The patient continues to complain of shortness of breath, orthopnea and weakness. She also reports nausea and poor appetite. By physical exam, she has bibasilar crackles. Heart is irregular and mildly tachycardic. There is trace leg edema bilaterally.  Recommendations: This is overall a difficult situation. The patient continues to be volume overloaded with gradual worsening of renal function. I agree with furosemide drip for now. She might ultimately require dialysis. Regarding atrial fibrillation, ventricular rate is still not optimally controlled in spite of metoprolol and amiodarone. Cardioversion has not been successful in maintaining sinus rhythm. I don't think this patient is optimized enough to undergo  AV nodal ablation and permanent pacemaker. We will discuss again with Dr. Caryl Comes. I'm concerned about the gradual decline in renal function and continuing Xarelto. Her creatinine clearance is now close to 15 and thus I went ahead and switch to low dose Eliquis twice daily. I answered all questions from patient and her son who was present in the room.  Kathlyn Sacramento MD, [facility] 03/27/2016 10:33 AM

## 2016-03-27 NOTE — Progress Notes (Signed)
[facility] [place] NAME: Regina Burke    MR#:  [id number]  DATE OF BIRTH:  [birth year]  SUBJECTIVE:  CHIEF COMPLAINT:   Chief Complaint  Patient presents with  . Shortness of Breath   - Feels better, but tired. Used BiPAP . Started on lasix drip yesterday - Kidney function, worse but stable today  REVIEW OF SYSTEMS:  Review of Systems  Constitutional: Negative for chills, fever and malaise/fatigue.  HENT: Negative for ear discharge, ear pain and nosebleeds.   Respiratory: Positive for shortness of breath. Negative for cough and wheezing.   Cardiovascular: Positive for palpitations and orthopnea. Negative for chest pain.  Gastrointestinal: Negative for abdominal pain, constipation, diarrhea, nausea and vomiting.  Genitourinary: Negative for dysuria and urgency.       Decreased urination  Musculoskeletal: Negative for myalgias.  Neurological: Negative for dizziness, speech change, focal weakness, seizures and headaches.  Psychiatric/Behavioral: The patient is nervous/anxious.     DRUG ALLERGIES:   Allergies  Allergen Reactions  . Ciprofloxacin Shortness Of Breath, Itching and Rash  . Doxycycline Shortness Of Breath, Itching and Rash  . Penicillins Shortness Of Breath, Itching, Rash and Other (See Comments)    Has patient had a PCN reaction causing immediate rash, facial/tongue/throat swelling, SOB or lightheadedness with hypotension: Yes Has patient had a PCN reaction causing severe rash involving mucus membranes or skin necrosis: No Has patient had a PCN reaction that required hospitalization No Has patient had a PCN reaction occurring within the last 10 years: No If all of the above answers are "NO", then may proceed with Cephalosporin use.  . Sulfa Antibiotics Shortness Of Breath, Itching and Rash  . Morphine And Related Itching  . Albuterol Sulfate   . Cefuroxime Rash    blisters    VITALS:  Blood pressure  112/73, pulse (!) 108, temperature 98 F (36.7 C), temperature source Oral, resp. rate 20, height '5\' 5"'$  (1.651 m), weight 87.2 kg (192 lb 3.2 oz), SpO2 98 %.  PHYSICAL EXAMINATION:  Physical Exam  GENERAL:  82 y.o.-year-old obese patient lying in the bed, Sitting comfortably. Not in any acute distress.  EYES: Pupils equal, round, reactive to light and accommodation. No scleral icterus. Extraocular muscles intact.  HEENT: Head atraumatic, normocephalic. Oropharynx and nasopharynx clear.  NECK:  Supple, no jugular venous distention. No thyroid enlargement, no tenderness.  LUNGS: Normal breath sounds bilaterally, no wheezing, rhonchi or crepitation. No use of accessory muscles of respiration. Bibasilar crackles heard. CARDIOVASCULAR: S1, S2 normal. No murmurs, rubs, or gallops.  ABDOMEN: Soft, obese, nontender, nondistended. Bowel sounds present. No organomegaly or mass.  EXTREMITIES: No cyanosis, or clubbing. No pedal edema NEUROLOGIC: Cranial nerves II through XII are intact. Muscle strength 5/5 in all extremities. Sensation intact. Gait not checked. Global weakness PSYCHIATRIC: The patient is alert and oriented x 3.  SKIN: No obvious rash, lesion, or ulcer.    LABORATORY PANEL:   CBC  Recent Labs Lab 03/27/16 0518  WBC 5.4  HGB 8.6*  HCT 26.0*  PLT 156   ------------------------------------------------------------------------------------------------------------------  Chemistries   Recent Labs Lab 03/27/16 0518  NA 139  K 4.8  CL 100*  CO2 31  GLUCOSE 140*  BUN 80*  CREATININE 3.72*  CALCIUM 8.5*   ------------------------------------------------------------------------------------------------------------------  Cardiac Enzymes No results for input(s): TROPONINI in the last 168 hours. ------------------------------------------------------------------------------------------------------------------  RADIOLOGY:  Dg Chest 2 View  Result Date: 03/27/2016 CLINICAL  DATA:  Pulmonary edema EXAM: CHEST  2 VIEW COMPARISON:  03/24/2010 FINDINGS: Cardiac shadow is stable. Aortic calcifications are again seen. Bilateral pleural effusions are again noted. Increasing bibasilar infiltrates are seen when compare with the prior exam. Left jugular central line is again noted and stable. IMPRESSION: Stable pleural effusions with increasing bibasilar infiltrates. Electronically Signed   By: Inez Catalina M.D.   On: 03/27/2016 11:16   EKG:   Orders placed or performed during the hospital encounter of 03/04/16  . EKG 12-Lead  . EKG 12-Lead  . EKG 12-Lead  . EKG 12-Lead  . EKG    ASSESSMENT AND PLAN:   82 yo female with a PMH of Type II diabetes, anemia, GERD, cushing's disease, asthma, CKD stage III, pneumonia, edema, paroxysmal atrial fibrillation, HTN, lung cancer s/p radiation 2015, CAD, NSTEMI, HTN, diverticulitis, and gout. She presented to [facility] ER via EMS on 7/20 with c/o worsening shortness of breath.   #1 Acute on chronic respiratory failure with hypoxemia and hypercapnia-secondary to acute on chronic combined CHF exacerbation. - on lasix drip now as clinically appears overloaded -Weight is 10 pounds more than previous admission weight -Continue BiPAP at bedtime and as needed. On 3 L oxygen.  #2 ARF on CKD-  worsened and steady at 3.8, decreased urine output - no acute indication for dialysis yet - overdiuresis, cardiorenal syndrome- fluid overloaded now -Appreciate Nephrology consult, on lasix drip started on 03/26/16- if no improvement- consider hemodialysis - foley catheter. Hold nephrotoxins  #3 COPD-stable at this time. Patient allergic to albuterol and Xopenex. -Continue Atrovent nebs. Not on systemic steroids. -Continue inhalers  #4 chronic atrial fibrillation-with difficult to control heart rate. -Continue amiodarone orally. Also on metoprolol. -Had developed each digitoxin toxicity twice in the past, so no digoxin at this time. -EP referral  TOMORROW by Dr. Caryl Comes to see if she needs AV nodal ablation. -Failed DC cardioversion in the past -on eliquis for anticoagulation.  #5 acute on chronic anemia-significant iron deficiency and also anemia of chronic disease. - Continue outpatient follow-up with cancer center. -No indication for transfusion at this time. Continue to monitor while on eliquis - epo can be considered  #6 diabetes mellitus-on sliding scale insulin  #7 DVT prophylaxis-on eliquis- renally dosed   If continues to get worse- consider palliative care consultation Discharge plans once kidney function is stabilized     All the records are reviewed and case discussed with Care Management/Social Workerr. Management plans discussed with the patient, family and they are in agreement.  CODE STATUS: DNR  TOTAL TIME TAKING CARE OF THIS PATIENT: 38 minutes.   POSSIBLE D/C  When renal function is stable, DEPENDING ON CLINICAL CONDITION.   Gladstone Lighter M.D on 03/27/2016 at 12:43 PM  Between 7am to 6pm - Pager - [phone]  After 6pm go to [email or web address] - password [email or web address] [facility]  Office  [phone]  CC: Primary care physician; Tracie Harrier, MD

## 2016-03-27 NOTE — Progress Notes (Signed)
[facility]  ROUNDING NOTE   Subjective:   Son at bedside.  Furosemide gtt '5mg'$  /hr  Creatinine 3.72 (3.83)  A-fib   Objective:  Vital signs in last 24 hours:  Temp:  [98 F (36.7 C)-98.7 F (37.1 C)] 98 F (36.7 C) (07/31 1128) Pulse Rate:  [100-110] 108 (07/31 1128) Resp:  [18-20] 20 (07/31 1128) BP: (109-116)/(67-76) 112/73 (07/31 1128) SpO2:  [94 %-98 %] 98 % (07/31 1128) FiO2 (%):  [32 %] 32 % (07/31 0753) Weight:  [87.2 kg (192 lb 3.2 oz)] 87.2 kg (192 lb 3.2 oz) (07/31 0437)  Weight change: -0.953 kg (-2 lb 1.6 oz) Filed Weights   03/25/16 0401 03/26/16 0419 03/27/16 0437  Weight: 87 kg (191 lb 14.4 oz) 88.1 kg (194 lb 4.8 oz) 87.2 kg (192 lb 3.2 oz)    Intake/Output: I/O last 3 completed shifts: In: 815.3 [P.O.:720; I.V.:95.3] Out: 1325 [Urine:1325]   Intake/Output this shift:  Total I/O In: 240 [P.O.:240] Out: -   Physical Exam: General: Sitting in bed  Head: Normocephalic, atraumatic.   Eyes: Anicteric  Neck: Supple, trachea midline  Lungs:  Bilateral rhonchi, on bipap  Heart: irregular  Abdomen:  Soft, nontender, BS present   Extremities:  trace peripheral edema.  Neurologic: Nonfocal, moving all four extremities  Skin: No lesions  Access:     Basic Metabolic Panel:  Recent Labs Lab 03/23/16 1000 03/24/16 1051 03/25/16 0519 03/26/16 0545 03/27/16 0518  NA 139 137 139 139 139  K 4.2 4.5 5.1 4.7 4.8  CL 97* 97* 100* 99* 100*  CO2 31 30 32 31 31  GLUCOSE 161* 179* 138* 101* 140*  BUN 63* 67* 73* 79* 80*  CREATININE 3.25* 3.51* 3.88* 3.83* 3.72*  CALCIUM 8.7* 8.5* 8.6* 8.5* 8.5*    Liver Function Tests: No results for input(s): AST, ALT, ALKPHOS, BILITOT, PROT, ALBUMIN in the last 168 hours. No results for input(s): LIPASE, AMYLASE in the last 168 hours. No results for input(s): AMMONIA in the last 168 hours.  CBC:  Recent Labs Lab 03/21/16 0508 03/23/16 1000 03/24/16 0940 03/26/16 0545 03/27/16 0518  WBC 5.6  8.5 8.5 4.4 5.4  NEUTROABS 4.4  --   --   --   --   HGB 8.3* 9.3* 9.3* 7.9* 8.6*  HCT 24.8* 27.9* 27.8* 24.1* 26.0*  MCV 80.3 79.4* 80.3 81.8 81.8  PLT 140* 182 174 132* 156    Cardiac Enzymes: No results for input(s): CKTOTAL, CKMB, CKMBINDEX, TROPONINI in the last 168 hours.  BNP: Invalid input(s): POCBNP  CBG:  Recent Labs Lab 03/26/16 1207 03/26/16 1643 03/26/16 2043 03/27/16 0730 03/27/16 1130  GLUCAP 131* 134* 138* 142* 181*    Microbiology: Results for orders placed or performed during the hospital encounter of 03/16/16  Culture, blood (routine x 2)     Status: None   Collection Time: 03/16/16  6:12 PM  Result Value Ref Range Status   Specimen Description BLOOD RIGHT HAND  Final   Special Requests BOTTLES DRAWN AEROBIC AND ANAEROBIC 6CC  Final   Culture NO GROWTH 5 DAYS  Final   Report Status 03/21/2016 FINAL  Final  Culture, blood (routine x 2)     Status: None   Collection Time: 03/16/16  6:40 PM  Result Value Ref Range Status   Specimen Description BLOOD LEFT WRIST  Final   Special Requests BOTTLES DRAWN AEROBIC AND ANAEROBIC 8CC  Final   Culture NO GROWTH 5 DAYS  Final   Report  Status 03/21/2016 FINAL  Final    Coagulation Studies: No results for input(s): LABPROT, INR in the last 72 hours.  Urinalysis: No results for input(s): COLORURINE, LABSPEC, PHURINE, GLUCOSEU, HGBUR, BILIRUBINUR, KETONESUR, PROTEINUR, UROBILINOGEN, NITRITE, LEUKOCYTESUR in the last 72 hours.  Invalid input(s): APPERANCEUR    Imaging: Dg Chest 2 View  Result Date: 03/27/2016 CLINICAL DATA:  Pulmonary edema EXAM: CHEST  2 VIEW COMPARISON:  03/24/2010 FINDINGS: Cardiac shadow is stable. Aortic calcifications are again seen. Bilateral pleural effusions are again noted. Increasing bibasilar infiltrates are seen when compare with the prior exam. Left jugular central line is again noted and stable. IMPRESSION: Stable pleural effusions with increasing bibasilar infiltrates.  Electronically Signed   By: Inez Catalina M.D.   On: 03/27/2016 11:16    Medications:   . furosemide (LASIX) infusion 5 mg/hr (03/26/16 1357)   . amiodarone  400 mg Oral BID  . apixaban  2.5 mg Oral Q12H  . ferrous sulfate  325 mg Oral Q breakfast  . fluticasone furoate-vilanterol  1 puff Inhalation Daily  . guaiFENesin  600 mg Oral BID  . insulin aspart  0-15 Units Subcutaneous TID WC  . insulin aspart  0-5 Units Subcutaneous QHS  . metoprolol tartrate  50 mg Oral BID  . pantoprazole  40 mg Oral Q1200  . sertraline  50 mg Oral Daily  . sodium chloride flush  10-40 mL Intracatheter Q12H  . tiotropium  18 mcg Inhalation Daily   sodium chloride, ALPRAZolam, guaiFENesin, ipratropium, metoprolol, ondansetron (ZOFRAN) IV, sodium chloride flush  Assessment/ Plan:  82 y.o. female  with diabetes mellitus type II, coronary artery disease, atrial fibrillation, hypertension, hyperlipidemia, generalized anxiety disorder, gout, anemia, history of lung cancer, COPD, atrial fibrillation, fatty liver disease, GERD, osteoporosis   1. Acute Renal Failure on Chronic kidney disease stage III and proteinuria: Baseline creatinine of 1.5, eGFR of 32. Acute renal failure from cardiorenal syndrome and then overdiuresis - furosemide gtt. If no improvement, will need to consider dialysis. Discussed with patient.   2. Acute respiratory failure secondary to acute systolic heart failure with atrial fibrillation.   - appreciate cardiology input.  - CXR today  3. Hypertension: at goal.  - metoprolol and furosemide.   4. Anemia of chronic kidney disease: hemoglobin 8.9 - low threshold to start epo   LOS: [place], [place] 7/31/201711:42 AM

## 2016-03-27 NOTE — Progress Notes (Signed)
Nutrition Follow-up  DOCUMENTATION CODES:   Obesity unspecified  INTERVENTION:  -Cater to pt preferences within dietary restrictions, pt may benefit from snacks between meals -Continue to monitor need for nutritional supplements   NUTRITION DIAGNOSIS:   Inadequate oral intake related to acute illness as evidenced by NPO status.  Improving as pt eating 75-100% of meal trays  GOAL:   Patient will meet greater than or equal to 90% of their needs  MONITOR:   Diet advancement, PO intake, Labs, Weight trends  REASON FOR ASSESSMENT:   Consult Enteral/tube feeding initiation and management  ASSESSMENT:    82 yo female admitted with acute on chronic respiratory failure with pulmonary edema requiring intubation on 7/20, extubated 7/21. Pt with hx of asthma, CHF, lung cancer s/p radiation in 2015. Pt with recent admission from 7/8 to 7/18 with CHF exacerbation and afib with RVR.    Acute on CKD with acute on chronic CHF, started on on lasix drip, possibility of HD if no improvement; respiratory status improved, 3L [place] during the day, BiPap at night.   Diet Order:  Diet heart healthy/carb modified Room service appropriate?: Yes; Fluid consistency:: Thin Diet - low sodium heart healthy   Energy Intake: pt eating 75-100% of meals, 84% on average  Skin:  Reviewed, no issues  Last BM:  7/23   Labs: Creatinine 3.72, BUN 80, sodium and potassium wdl  Glucose Profile:  Recent Labs  03/26/16 2043 03/27/16 0730 03/27/16 1130  GLUCAP 138* 142* 181*    Meds: lasix drip, ss novolog  Height:   Ht Readings from Last 1 Encounters:  03/16/16 '5\' 5"'$  (1.651 m)    Weight:   Wt Readings from Last 1 Encounters:  03/27/16 192 lb 3.2 oz (87.2 kg)    Filed Weights   03/25/16 0401 03/26/16 0419 03/27/16 0437  Weight: 191 lb 14.4 oz (87 kg) 194 lb 4.8 oz (88.1 kg) 192 lb 3.2 oz (87.2 kg)    BMI:  Body mass index is 31.98 kg/m.  Estimated Nutritional Needs:   Kcal:  0600-4599  kcals   Protein:  85-101 g  Fluid:  >/= 1.7 L  EDUCATION NEEDS:   No education needs identified at this time  [place], [place], [place] [phone] Pager  [phone] Weekend/On-Call Pager

## 2016-03-27 NOTE — Progress Notes (Signed)
PT Cancellation Note  Patient Details Name: Regina Burke MRN: [id number] DOB: 1933-07-13   Cancelled Treatment:    Reason Eval/Treat Not Completed: Patient declined, no reason specified;Other (comment). Per PT, treatment attempted this morn; pt declined and therapist offered re attempt this afternoon. Treatment attempted again earlier this afternoon; pt on Bipap and declines shaking her head no and waving hand to leave. Re attempt treatment at another date.    Charlaine Dalton, [place] 03/27/2016, 3:54 PM

## 2016-03-27 NOTE — Care Management Important Message (Signed)
Important Message  Patient Details  Name: Regina Burke MRN: [id number] Date of Birth: 1933-04-13   Medicare Important Message Given:  Yes    Jolly Mango, RN 03/27/2016, 9:00 AM

## 2016-03-27 NOTE — Progress Notes (Signed)
RT removed patient from Bipap per her request, placed on 3.5LPM [place] without complication.  Patient tolerating [place] well at this time, no distress noted. Will continue to monitor.

## 2016-03-28 ENCOUNTER — Ambulatory Visit: Admission: RE | Admit: 2016-03-28 | Payer: Medicare Other | Source: Ambulatory Visit | Admitting: Ophthalmology

## 2016-03-28 ENCOUNTER — Inpatient Hospital Stay (HOSPITAL_COMMUNITY)
Admit: 2016-03-28 | Discharge: 2016-03-28 | Disposition: A | Discharge status: Home / Self Care | Payer: Medicare Other | Attending: Physician Assistant | Admitting: Physician Assistant

## 2016-03-28 ENCOUNTER — Encounter: Admission: RE | Payer: Self-pay | Source: Ambulatory Visit

## 2016-03-28 DIAGNOSIS — J9600 Acute respiratory failure, unspecified whether with hypoxia or hypercapnia: Secondary | ICD-10-CM

## 2016-03-28 DIAGNOSIS — I4891 Unspecified atrial fibrillation: Secondary | ICD-10-CM

## 2016-03-28 DIAGNOSIS — I5043 Acute on chronic combined systolic (congestive) and diastolic (congestive) heart failure: Secondary | ICD-10-CM

## 2016-03-28 DIAGNOSIS — R0600 Dyspnea, unspecified: Secondary | ICD-10-CM

## 2016-03-28 LAB — RENAL FUNCTION PANEL
Albumin: 3.4 g/dL — ABNORMAL LOW (ref 3.5–5.0)
Anion gap: 8 (ref 5–15)
BUN: 81 mg/dL — ABNORMAL HIGH (ref 6–20)
CO2: 33 mmol/L — ABNORMAL HIGH (ref 22–32)
Calcium: 8.5 mg/dL — ABNORMAL LOW (ref 8.9–10.3)
Chloride: 99 mmol/L — ABNORMAL LOW (ref 101–111)
Creatinine, Ser: 3.6 mg/dL — ABNORMAL HIGH (ref 0.44–1.00)
GFR calc Af Amer: 13 mL/min — ABNORMAL LOW
GFR calc non Af Amer: 11 mL/min — ABNORMAL LOW
Glucose, Bld: 144 mg/dL — ABNORMAL HIGH (ref 65–99)
Phosphorus: 4.5 mg/dL (ref 2.5–4.6)
Potassium: 4.4 mmol/L (ref 3.5–5.1)
Sodium: 140 mmol/L (ref 135–145)

## 2016-03-28 LAB — GLUCOSE, CAPILLARY
GLUCOSE-CAPILLARY: 130 mg/dL — AB (ref 65–99)
GLUCOSE-CAPILLARY: 166 mg/dL — AB (ref 65–99)
Glucose-Capillary: 155 mg/dL — ABNORMAL HIGH (ref 65–99)
Glucose-Capillary: 128 mg/dL — ABNORMAL HIGH (ref 65–99)

## 2016-03-28 LAB — CBC
HEMATOCRIT: 25.4 % — AB (ref 35.0–47.0)
Hemoglobin: 8.5 g/dL — ABNORMAL LOW (ref 12.0–16.0)
MCH: 27.5 pg (ref 26.0–34.0)
MCHC: 33.7 g/dL (ref 32.0–36.0)
MCV: 81.7 fL (ref 80.0–100.0)
PLATELETS: 149 10*3/uL — AB (ref 150–440)
RBC: 3.11 MIL/uL — AB (ref 3.80–5.20)
RDW: 18.4 % — AB (ref 11.5–14.5)
WBC: 4.4 10*3/uL (ref 3.6–11.0)

## 2016-03-28 LAB — BASIC METABOLIC PANEL WITH GFR
Anion gap: 9 (ref 5–15)
BUN: 83 mg/dL — ABNORMAL HIGH (ref 6–20)
CO2: 33 mmol/L — ABNORMAL HIGH (ref 22–32)
Calcium: 8.6 mg/dL — ABNORMAL LOW (ref 8.9–10.3)
Chloride: 99 mmol/L — ABNORMAL LOW (ref 101–111)
Creatinine, Ser: 3.41 mg/dL — ABNORMAL HIGH (ref 0.44–1.00)
GFR calc Af Amer: 14 mL/min — ABNORMAL LOW
GFR calc non Af Amer: 12 mL/min — ABNORMAL LOW
Glucose, Bld: 147 mg/dL — ABNORMAL HIGH (ref 65–99)
Potassium: 4.4 mmol/L (ref 3.5–5.1)
Sodium: 141 mmol/L (ref 135–145)

## 2016-03-28 SURGERY — PHACOEMULSIFICATION, CATARACT, WITH IOL INSERTION
Anesthesia: Choice | Laterality: Left

## 2016-03-28 MED ORDER — DARBEPOETIN ALFA 25 MCG/0.42ML IJ SOSY
25.0000 ug | PREFILLED_SYRINGE | Freq: Once | INTRAMUSCULAR | Status: AC
  Administered 2016-03-28: 25 ug via SUBCUTANEOUS
  Filled 2016-03-28: qty 0.42

## 2016-03-28 MED ORDER — FUROSEMIDE 10 MG/ML IJ SOLN
40.0000 mg | Freq: Two times a day (BID) | INTRAMUSCULAR | Status: DC
  Administered 2016-03-28 – 2016-03-30 (×5): 40 mg via INTRAVENOUS
  Filled 2016-03-28 (×5): qty 4

## 2016-03-28 NOTE — Progress Notes (Signed)
    Planning for transfer to [facility] on Thursday, 03/30/16 for EP to perform AV node ablation and implantation of PPM on Friday, 03/31/16. Will check echo to evaluate LV systolic function now that her heart rate is well controlled. Will also ask pulmonary to evaluate the patient regarding her respiratory status for procedure requiring intubation.

## 2016-03-28 NOTE — Progress Notes (Signed)
BP 111/73. Spoke to MD, gave orders to hold lasix, give amiodarone and metoprolol. Ammie Dalton, RN

## 2016-03-28 NOTE — Progress Notes (Signed)
[facility]  ROUNDING NOTE   Subjective:   Son at bedside.  Furosemide gtt '5mg'$  /hr  Creatinine 3.41 (3.72)   Objective:  Vital signs in last 24 hours:  Temp:  [97.7 F (36.5 C)-98.2 F (36.8 C)] 97.7 F (36.5 C) (08/01 0431) Pulse Rate:  [84-111] 84 (08/01 0854) Resp:  [18-20] 18 (08/01 0431) BP: (104-131)/(40-108) 111/73 (08/01 0854) SpO2:  [96 %-98 %] 96 % (08/01 0854) Weight:  [86.8 kg (191 lb 4.8 oz)] 86.8 kg (191 lb 4.8 oz) (08/01 0431)  Weight change: -0.408 kg (-14.4 oz) Filed Weights   03/26/16 0419 03/27/16 0437 03/28/16 0431  Weight: 88.1 kg (194 lb 4.8 oz) 87.2 kg (192 lb 3.2 oz) 86.8 kg (191 lb 4.8 oz)    Intake/Output: I/O last 3 completed shifts: In: 545.3 [P.O.:480; I.V.:65.3] Out: 2575 [Urine:2575]   Intake/Output this shift:  Total I/O In: 250 [P.O.:240; I.V.:10] Out: 0   Physical Exam: General: Sitting in bed  Head: Normocephalic, atraumatic.   Eyes: Anicteric  Neck: Supple, trachea midline  Lungs:  Bilateral rhonchi, on bipap  Heart: irregular  Abdomen:  Soft, nontender, BS present   Extremities:  trace peripheral edema.  Neurologic: Nonfocal, moving all four extremities  Skin: No lesions       Basic Metabolic Panel:  Recent Labs Lab 03/25/16 0519 03/26/16 0545 03/27/16 0518 03/28/16 0500 03/28/16 0517  NA 139 139 139 140 141  K 5.1 4.7 4.8 4.4 4.4  CL 100* 99* 100* 99* 99*  CO2 32 31 31 33* 33*  GLUCOSE 138* 101* 140* 144* 147*  BUN 73* 79* 80* 81* 83*  CREATININE 3.88* 3.83* 3.72* 3.60* 3.41*  CALCIUM 8.6* 8.5* 8.5* 8.5* 8.6*  PHOS  --   --   --  4.5  --     Liver Function Tests:  Recent Labs Lab 03/28/16 0500  ALBUMIN 3.4*   No results for input(s): LIPASE, AMYLASE in the last 168 hours. No results for input(s): AMMONIA in the last 168 hours.  CBC:  Recent Labs Lab 03/23/16 1000 03/24/16 0940 03/26/16 0545 03/27/16 0518 03/28/16 0517  WBC 8.5 8.5 4.4 5.4 4.4  HGB 9.3* 9.3* 7.9* 8.6* 8.5*   HCT 27.9* 27.8* 24.1* 26.0* 25.4*  MCV 79.4* 80.3 81.8 81.8 81.7  PLT 182 174 132* 156 149*    Cardiac Enzymes: No results for input(s): CKTOTAL, CKMB, CKMBINDEX, TROPONINI in the last 168 hours.  BNP: Invalid input(s): POCBNP  CBG:  Recent Labs Lab 03/27/16 0730 03/27/16 1130 03/27/16 1626 03/27/16 2058 03/28/16 0729  GLUCAP 142* 181* 103* 147* 130*    Microbiology: Results for orders placed or performed during the hospital encounter of 03/16/16  Culture, blood (routine x 2)     Status: None   Collection Time: 03/16/16  6:12 PM  Result Value Ref Range Status   Specimen Description BLOOD RIGHT HAND  Final   Special Requests BOTTLES DRAWN AEROBIC AND ANAEROBIC 6CC  Final   Culture NO GROWTH 5 DAYS  Final   Report Status 03/21/2016 FINAL  Final  Culture, blood (routine x 2)     Status: None   Collection Time: 03/16/16  6:40 PM  Result Value Ref Range Status   Specimen Description BLOOD LEFT WRIST  Final   Special Requests BOTTLES DRAWN AEROBIC AND ANAEROBIC 8CC  Final   Culture NO GROWTH 5 DAYS  Final   Report Status 03/21/2016 FINAL  Final    Coagulation Studies: No results for input(s): LABPROT, INR  in the last 72 hours.  Urinalysis: No results for input(s): COLORURINE, LABSPEC, PHURINE, GLUCOSEU, HGBUR, BILIRUBINUR, KETONESUR, PROTEINUR, UROBILINOGEN, NITRITE, LEUKOCYTESUR in the last 72 hours.  Invalid input(s): APPERANCEUR    Imaging: Dg Chest 2 View  Result Date: 03/27/2016 CLINICAL DATA:  Pulmonary edema EXAM: CHEST  2 VIEW COMPARISON:  03/24/2010 FINDINGS: Cardiac shadow is stable. Aortic calcifications are again seen. Bilateral pleural effusions are again noted. Increasing bibasilar infiltrates are seen when compare with the prior exam. Left jugular central line is again noted and stable. IMPRESSION: Stable pleural effusions with increasing bibasilar infiltrates. Electronically Signed   By: Inez Catalina M.D.   On: 03/27/2016 11:16    Medications:      . amiodarone  400 mg Oral BID  . apixaban  2.5 mg Oral Q12H  . Darbepoetin Alfa  25 mcg Subcutaneous Once  . ferrous sulfate  325 mg Oral Q breakfast  . fluticasone furoate-vilanterol  1 puff Inhalation Daily  . furosemide  40 mg Intravenous Q12H  . guaiFENesin  600 mg Oral BID  . insulin aspart  0-15 Units Subcutaneous TID WC  . insulin aspart  0-5 Units Subcutaneous QHS  . metoprolol tartrate  50 mg Oral BID  . pantoprazole  40 mg Oral Q1200  . sertraline  50 mg Oral Daily  . sodium chloride flush  10-40 mL Intracatheter Q12H  . tiotropium  18 mcg Inhalation Daily   sodium chloride, ALPRAZolam, guaiFENesin, ipratropium, metoprolol, ondansetron (ZOFRAN) IV, sodium chloride flush  Assessment/ Plan:  82 y.o. female  with diabetes mellitus type II, coronary artery disease, atrial fibrillation, hypertension, hyperlipidemia, generalized anxiety disorder, gout, anemia, history of lung cancer, COPD, atrial fibrillation, fatty liver disease, GERD, osteoporosis   1. Acute Renal Failure on Chronic kidney disease stage III and proteinuria: Baseline creatinine of 1.5, eGFR of 32. Acute renal failure from cardiorenal syndrome and then overdiuresis - Change to IV furosemide '40mg'$  q12. Still low threshold to start dialysis. Discussed with family.   2. Acute respiratory failure secondary to acute systolic heart failure with atrial fibrillation.   - appreciate cardiology input.   3. Hypertension: at goal.  - metoprolol and furosemide.   4. Anemia of chronic kidney disease: hemoglobin 8.5 - Darbopoeitin 25 given subcu today 8/1.    LOS: [place], [place] 8/1/201711:09 AM

## 2016-03-28 NOTE — Progress Notes (Signed)
Name: Regina Burke MRN: [id number] DOB: 06/09/1933    ADMISSION DATE:  03/16/2016  HISTORY OF PRESENT ILLNESS:   This is a 82 yo female with a PMH of Type II diabetes, anemia, GERD, cushing's disease, asthma, CKD stage III, pneumonia, edema, paroxysmal atrial fibrillation, HTN, lung cancer s/p radiation 2015, CAD, NSTEMI, HTN, diverticulitis, and gout.  She presented to [facility] ER via EMS on 7/20 with c/o worsening shortness of breath. She was recently admitted to the hospital on 03/04/16 and discharged 03/14/16 for CHF exacerbation and Afibb/RVR and was discharged on her normal supplemental oxygen of 3L.  Per EMS they found her on 3L O2 at home with O2 sats 91% and somnolent.  EMS placed her on bipap however she kept attempting to remove the bipap mask.  She further decompensated in the ER and required intubation.  PCCM to admit to ICU for acute on chronic hypoxic hypercapnic respiratory failure secondary to pulmonary edema and mechanical ventilation management.  Pt extubated 03/17/16 and transitioned to prn and qhs Bipap.  PCCM reconsulted 8/1 for pulmonology clearance for AV nodal ablation procedure due to chronic respiratory failure history  REVIEW OF SYSTEMS:  Positives in BOLD Constitutional: Negative for fever, chills, weight loss, malaise/fatigue and diaphoresis.  HENT: Negative for hearing loss, ear pain, nosebleeds, congestion, sore throat, neck pain, tinnitus and ear discharge.   Eyes: Negative for blurred vision, double vision, photophobia, pain, discharge and redness.  Respiratory: cough, hemoptysis, sputum production, shortness of breath, wheezing and stridor.   Cardiovascular: Negative for chest pain, palpitations, orthopnea, claudication, leg swelling and PND.  Gastrointestinal: heartburn, nausea, vomiting, abdominal pain, diarrhea, constipation, blood in stool and melena.  Genitourinary: Negative for dysuria, urgency, frequency, hematuria and flank pain.  Musculoskeletal: Negative for  myalgias, back pain, joint pain and falls.  Skin: Negative for itching and rash.  Neurological: Negative for dizziness, tingling, tremors, sensory change, speech change, focal weakness, seizures, loss of consciousness, weakness and headaches.  Endo/Heme/Allergies: Negative for environmental allergies and polydipsia. Does not bruise/bleed easily.  SUBJECTIVE:  Pt states she was short of breath after sitting up in chair for 4 hrs on 3L O2 she states today was the most activity she has done in a while. Per pt her breathing has improved once she was helped back into bed.  She normally uses her bipap once during the day for 2 hrs and qhs.   VITAL SIGNS: Temp:  [97.6 F (36.4 C)-98.2 F (36.8 C)] 97.6 F (36.4 C) (08/01 1136) Pulse Rate:  [84-111] 99 (08/01 1136) Resp:  [18-20] 20 (08/01 1136) BP: (104-131)/(40-108) 116/63 (08/01 1136) SpO2:  [96 %-100 %] 100 % (08/01 1136) Weight:  [191 lb 4.8 oz (86.8 kg)] 191 lb 4.8 oz (86.8 kg) (08/01 0431)  PHYSICAL EXAMINATION: General:  Well developed, well nourished Neuro:  Alert and oriented, follows commands HEENT:  Supple, no JVD Cardiovascular:  Irregular irregular, rate controlled, no M/R/G Lungs:  Diminished throughout, even, non labored Abdomen: +BS x4, soft, non tender, non distended Musculoskeletal:  Normal bulk and tone Skin:  Intact, no rashes or lesions    ASSESSMENT / PLAN: Acute on Chronic Hypercapnic Hypoxic Respiratory Failure secondary to pulmonary edema OSA (sleep study 06/2015 positive for OSA, required CPAP) Pulmonary Hypertension Hx: Asthma, lung cancer s/p radiation 2015, COPD  PLAN: Continue Bipap prn and qhs and supplemental O2 to maintain O2 sats 88% to 94% Per pt she has been approved for bipap at [facility] will follow-up with case management to  verify on 8/2 Continue Breo Ellipta and prn Atrovent From a respiratory standpoint she is cleared for AV nodal ablation procedure, however she is a moderate risk for  pulmonary complications and recommend Bipap before and after the procedure and minimize use of sedating medications as possible   Marda Stalker, [place] Pager [phone] (please enter 7 digits) [place] Pager [phone] (please enter [phone])   PCCM ATTENDING ATTESTATION:  I have evaluated patient with ANP [facility], reviewed database in its entirety and discussed care plan in detail. Agree with above      Merton Border, MD PCCM service Mobile [phone] Pager [phone]

## 2016-03-28 NOTE — Progress Notes (Signed)
Checked to see if pre-authorization would be needed for non-emergent EMS transport. Per [employer] benefits obtained online through [employer], patient has a [employer] Medicare Advantage PPO policy.  Medicare PPO plans do not require pre-auth for non-emergent ground transports using service codes A0426 or [id number].   

## 2016-03-28 NOTE — Progress Notes (Signed)
*  PRELIMINARY RESULTS* Echocardiogram 2D Echocardiogram has been performed.  Regina Burke 03/28/2016, 2:45 PM

## 2016-03-28 NOTE — Progress Notes (Signed)
PT Cancellation Note  Patient Details Name: Regina Burke MRN: [id number] DOB: [birth year]   Cancelled Treatment:    Reason Eval/Treat Not Completed: Patient declined, no reason specified;Other (comment). Treatment attempted; pt notes feeling better today than the past couple of days, but states she had been in the chair since 10 this morning recently returning to bed. Pt does not wish to ambulate or exercise, as being up all day has tired her out. Per chart review, plan is for pt to transfer to [facility] on Thursday, 03/30/16 for heart procedure and pacemaker on 03/31/16. Continue PT as tolerated.     Charlaine Dalton, [place] 03/28/2016, 3:00 PM

## 2016-03-28 NOTE — Progress Notes (Signed)
[name] [place] NAME: Regina Burke    MR#:  [id number]  DATE OF BIRTH:  1932-10-11  SUBJECTIVE:  CHIEF COMPLAINT:   Chief Complaint  Patient presents with  . Shortness of Breath   - Feels better today, appears stronger, net negative while on lasix drip - renal function improving as well - EP consult today  REVIEW OF SYSTEMS:  Review of Systems  Constitutional: Negative for chills, fever and malaise/fatigue.  HENT: Negative for ear discharge, ear pain and nosebleeds.   Respiratory: Positive for shortness of breath. Negative for cough and wheezing.   Cardiovascular: Positive for palpitations and orthopnea. Negative for chest pain.  Gastrointestinal: Negative for abdominal pain, constipation, diarrhea, nausea and vomiting.  Genitourinary: Negative for dysuria and urgency.       Decreased urination  Musculoskeletal: Negative for myalgias.  Neurological: Negative for dizziness, speech change, focal weakness, seizures and headaches.    DRUG ALLERGIES:   Allergies  Allergen Reactions  . Ciprofloxacin Shortness Of Breath, Itching and Rash  . Doxycycline Shortness Of Breath, Itching and Rash  . Penicillins Shortness Of Breath, Itching, Rash and Other (See Comments)    Has patient had a PCN reaction causing immediate rash, facial/tongue/throat swelling, SOB or lightheadedness with hypotension: Yes Has patient had a PCN reaction causing severe rash involving mucus membranes or skin necrosis: No Has patient had a PCN reaction that required hospitalization No Has patient had a PCN reaction occurring within the last 10 years: No If all of the above answers are "NO", then may proceed with Cephalosporin use.  . Sulfa Antibiotics Shortness Of Breath, Itching and Rash  . Morphine And Related Itching  . Albuterol Sulfate   . Cefuroxime Rash    blisters    VITALS:  Blood pressure 116/63, pulse 99, temperature 97.6 F (36.4 C),  resp. rate 20, height '5\' 5"'$  (1.651 m), weight 86.8 kg (191 lb 4.8 oz), SpO2 100 %.  PHYSICAL EXAMINATION:  Physical Exam  GENERAL:  82 y.o.-year-old obese patient lying in the bed, Sitting comfortably. Not in any acute distress.  EYES: Pupils equal, round, reactive to light and accommodation. No scleral icterus. Extraocular muscles intact.  HEENT: Head atraumatic, normocephalic. Oropharynx and nasopharynx clear.  NECK:  Supple, no jugular venous distention. No thyroid enlargement, no tenderness.  LUNGS: Normal breath sounds bilaterally, no wheezing, rhonchi or crepitation. No use of accessory muscles of respiration. Bibasilar crackles heard. CARDIOVASCULAR: S1, S2 normal. No murmurs, rubs, or gallops.  ABDOMEN: Soft, obese, nontender, nondistended. Bowel sounds present. No organomegaly or mass.  EXTREMITIES: No cyanosis, or clubbing. No pedal edema NEUROLOGIC: Cranial nerves II through XII are intact. Muscle strength 5/5 in all extremities. Sensation intact. Gait not checked. Global weakness PSYCHIATRIC: The patient is alert and oriented x 3.  SKIN: No obvious rash, lesion, or ulcer.    LABORATORY PANEL:   CBC  Recent Labs Lab 03/28/16 0517  WBC 4.4  HGB 8.5*  HCT 25.4*  PLT 149*   ------------------------------------------------------------------------------------------------------------------  Chemistries   Recent Labs Lab 03/28/16 0517  NA 141  K 4.4  CL 99*  CO2 33*  GLUCOSE 147*  BUN 83*  CREATININE 3.41*  CALCIUM 8.6*   ------------------------------------------------------------------------------------------------------------------  Cardiac Enzymes No results for input(s): TROPONINI in the last 168 hours. ------------------------------------------------------------------------------------------------------------------  RADIOLOGY:  Dg Chest 2 View  Result Date: 03/27/2016 CLINICAL DATA:  Pulmonary edema EXAM: CHEST  2 VIEW COMPARISON:  03/24/2010 FINDINGS:  Cardiac shadow is  stable. Aortic calcifications are again seen. Bilateral pleural effusions are again noted. Increasing bibasilar infiltrates are seen when compare with the prior exam. Left jugular central line is again noted and stable. IMPRESSION: Stable pleural effusions with increasing bibasilar infiltrates. Electronically Signed   By: Inez Catalina M.D.   On: 03/27/2016 11:16   EKG:   Orders placed or performed during the hospital encounter of 03/04/16  . EKG 12-Lead  . EKG 12-Lead  . EKG 12-Lead  . EKG 12-Lead  . EKG    ASSESSMENT AND PLAN:   82 yo female with a PMH of Type II diabetes, anemia, GERD, cushing's disease, asthma, CKD stage III, pneumonia, edema, paroxysmal atrial fibrillation, HTN, lung cancer s/p radiation 2015, CAD, NSTEMI, HTN, diverticulitis, and gout. She presented to [facility] ER via EMS on 7/20 with c/o worsening shortness of breath.   #1 Acute on chronic respiratory failure with hypoxemia and hypercapnia-secondary to acute on chronic combined CHF exacerbation. - was on lasix drip now with improvement- changed to IV lasix bid today -Continue BiPAP at bedtime and as needed. On 3 L oxygen.  #2 ARF on CKD-  Baseline at 1.8, worsened and now improving with lasix drip. - changed to IV lasix, secondary to cardiorenal syndrome -Appreciate Nephrology consult- foley catheter. Hold nephrotoxins  #3 COPD-stable at this time. Patient allergic to albuterol and Xopenex. -Continue Atrovent nebs. Not on systemic steroids. -Continue inhalers - Pulmonary to eval her resp status tomorrow for possible ablation at [facility] later this week.  #4 chronic atrial fibrillation-with difficult to control heart rate. -Continue amiodarone orally. Also on metoprolol. -Had developed each digitoxin toxicity twice in the past, so no digoxin at this time. -EP consult for  AV nodal ablation.Likely scheduled for Friday at [facility].  -Failed DC cardioversion in the past -on eliquis for  anticoagulation.  #5 acute on chronic anemia-significant iron deficiency and also anemia of chronic disease. - Continue outpatient follow-up with cancer center. -No indication for transfusion at this time. Continue to monitor while on eliquis - epo can be considered  #6 diabetes mellitus-on sliding scale insulin  #7 DVT prophylaxis-on eliquis- renally dosed   Possible transfer to [facility] on Thursday if stable for AV nodal ablation this Friday. Pulm consult tomorrow for clearance.    All the records are reviewed and case discussed with Care Management/Social Workerr. Management plans discussed with the patient, family and they are in agreement.  CODE STATUS: DNR  TOTAL TIME TAKING CARE OF THIS PATIENT: 38 minutes.    Kasyn Stouffer M.D on 03/28/2016 at 1:49 PM  Between 7am to 6pm - Pager - [phone]  After 6pm go to [email or web address] - password [email or web address] [facility] [facility]  Office  [phone]  CC: Primary care physician; Tracie Harrier, MD

## 2016-03-28 NOTE — Progress Notes (Signed)
Patient: Regina Burke / Admit Date: 03/16/2016 / Date of Encounter: 03/28/2016, 5:13 PM   Subjective: Lasix drip turned off this morning Feels better, had more than 1 L urine output net negative BiPAP last night and again while sleeping this morning   She is not able to lie flat but her respiratory status is much improved.  Review of Systems: Review of Systems  Constitutional: Positive for malaise/fatigue.  Respiratory: Positive for cough and shortness of breath.   Cardiovascular: Negative.   Gastrointestinal: Negative.   Musculoskeletal: Negative.   Neurological: Positive for weakness.  Psychiatric/Behavioral: Negative.   All other systems reviewed and are negative.   Objective: Telemetry: Atrial fibrillation with rate 100 up to 110 bpm Physical Exam: Blood pressure 116/63, pulse 99, temperature 97.6 F (36.4 C), resp. rate 20, height '5\' 5"'$  (1.651 m), weight 191 lb 4.8 oz (86.8 kg), SpO2 100 %. Body mass index is 31.83 kg/m. General: Well developed, well nourished, in no acute distress. Nasal cannula oxygen in place Head: Normocephalic, atraumatic, sclera non-icteric, no xanthomas, nares are without discharge. Neck: Negative for carotid bruits. JVP 10 cm to ankle of the jaw Lungs: Dullness at the bases bilaterally, crackles to halfway bilaterally, mildly  labored breathing Heart: Irregularly irregular with murmurs, no rubs, or gallops.  Abdomen: Soft, non-tender, non-distended with normoactive bowel sounds. No rebound/guarding. Extremities: No clubbing or cyanosis. No edema. Distal pedal pulses are 2+ and equal bilaterally. Neuro: Alert and oriented X 3. Moves all extremities spontaneously. Psych:  Responds to questions appropriately with a normal affect.   Intake/Output Summary (Last 24 hours) at 03/28/16 1713 Last data filed at 03/28/16 1347  Gross per 24 hour  Intake              610 ml  Output             1850 ml  Net            -1240 ml    Inpatient  Medications:  . amiodarone  400 mg Oral BID  . apixaban  2.5 mg Oral Q12H  . Darbepoetin Alfa  25 mcg Subcutaneous Once  . ferrous sulfate  325 mg Oral Q breakfast  . fluticasone furoate-vilanterol  1 puff Inhalation Daily  . furosemide  40 mg Intravenous Q12H  . guaiFENesin  600 mg Oral BID  . insulin aspart  0-15 Units Subcutaneous TID WC  . insulin aspart  0-5 Units Subcutaneous QHS  . metoprolol tartrate  50 mg Oral BID  . pantoprazole  40 mg Oral Q1200  . sertraline  50 mg Oral Daily  . sodium chloride flush  10-40 mL Intracatheter Q12H  . tiotropium  18 mcg Inhalation Daily   Infusions:    Labs:  Recent Labs  03/28/16 0500 03/28/16 0517  NA 140 141  K 4.4 4.4  CL 99* 99*  CO2 33* 33*  GLUCOSE 144* 147*  BUN 81* 83*  CREATININE 3.60* 3.41*  CALCIUM 8.5* 8.6*  PHOS 4.5  --     Recent Labs  03/28/16 0500  ALBUMIN 3.4*    Recent Labs  03/27/16 0518 03/28/16 0517  WBC 5.4 4.4  HGB 8.6* 8.5*  HCT 26.0* 25.4*  MCV 81.8 81.7  PLT 156 149*   No results for input(s): CKTOTAL, CKMB, TROPONINI in the last 72 hours. Invalid input(s): POCBNP No results for input(s): HGBA1C in the last 72 hours.   Weights: [employer]   03/26/16 0092 03/27/16 3300 03/28/16 0431  Weight: 194 lb 4.8 oz (88.1 kg) 192 lb 3.2 oz (87.2 kg) 191 lb 4.8 oz (86.8 kg)     Radiology/Studies:  Dg Chest 1 View  Result Date: 03/04/2016 CLINICAL DATA:  Respiratory distress EXAM: CHEST 1 VIEW COMPARISON:  Chest radiograph dated 01/24/2016. CT chest dated 01/21/2016. FINDINGS: Patchy bilateral lower lobe opacities, suspicious for pneumonia, less likely atelectasis. Superimposed mild interstitial edema is suspected. Small bilateral pleural effusions. No pneumothorax. Again noted is a stable focal opacity in the medial right upper lobe, most of which likely reflects radiation changes when correlating with prior CT, underlying residual/recurrent tumor not excluded. Cardiomegaly. IMPRESSION: Patchy  bilateral lower lobe opacities, suspicious for pneumonia, less likely atelectasis. Superimposed mild interstitial edema is suspected. Small bilateral pleural effusions. Stable focal opacity in the medial right upper lobe, most of which likely reflects radiation changes when correlating with prior CT, underlying residual/recurrent tumor not excluded. Electronically Signed   By: Julian Hy M.D.   On: 03/04/2016 21:22   Dg Chest 2 View  Result Date: 03/27/2016 CLINICAL DATA:  Pulmonary edema EXAM: CHEST  2 VIEW COMPARISON:  03/24/2010 FINDINGS: Cardiac shadow is stable. Aortic calcifications are again seen. Bilateral pleural effusions are again noted. Increasing bibasilar infiltrates are seen when compare with the prior exam. Left jugular central line is again noted and stable. IMPRESSION: Stable pleural effusions with increasing bibasilar infiltrates. Electronically Signed   By: Inez Catalina M.D.   On: 03/27/2016 11:16  Dg Abd 1 View  Result Date: 03/16/2016 CLINICAL DATA:  Evaluate OG tube EXAM: ABDOMEN - 1 VIEW COMPARISON:  None. FINDINGS: The side port of the OG tube is near the GE junction. The distal tip is in the stomach. See the chest x-ray report from the same time for description of the lung bases. No other acute abnormalities. IMPRESSION: The side port of the OG tube is near the GE junction. Consider advancement of the tube further into the stomach. Electronically Signed   By: Dorise Bullion III M.D   On: 03/16/2016 18:28   Ct Chest Wo Contrast  Result Date: 03/07/2016 CLINICAL DATA:  History of COPD with respiratory distress for the past 3 days EXAM: CT CHEST WITHOUT CONTRAST TECHNIQUE: Multidetector CT imaging of the chest was performed following the standard protocol without IV contrast. COMPARISON:  03/04/2016, 01/21/2016 FINDINGS: Cardiovascular: Calcifications of the thoracic aorta are noted without aneurysmal dilatation. The cardiac structures show coronary calcifications.  Mediastinum/Nodes: The thoracic inlet demonstrates some nodularity within the right lobe of the thyroid. This is stable from the prior exam. No significant hilar or mediastinal adenopathy is noted. Lungs/Pleura: Bilateral pleural effusions are seen left slightly greater than right. Some associated lower lobe consolidation is noted again left greater than right. These changes are relatively stable from the prior exam. The ground-glass density seen in the left upper lobe is again identified and has improved somewhat in the interval from the prior exam. Scarring in the right upper lobe is again noted stable from the prior exam as well. No new focal infiltrate is seen. Upper Abdomen: Stable cystic lesions are again seen in the spleen. Right adrenal lesion is again noted and stable. Musculoskeletal: No acute bony abnormality noted. IMPRESSION: Bilateral pleural effusions and associated lower lobe consolidation stable from the prior study. Stable changes in the right upper lobe similar to that seen on the recent exam. Slight improvement in the ground-glass density within the left upper lobe. Electronically Signed   By: Linus Mako.D.  On: 03/07/2016 16:36   Dg Chest Port 1 View  Result Date: 03/24/2016 CLINICAL DATA:  Dyspnea.  History of asthma, COPD, lung cancer. EXAM: PORTABLE CHEST 1 VIEW COMPARISON:  03/20/2016 FINDINGS: Left internal jugular approach central venous catheter unchanged. Cardiomediastinal silhouette is mildly enlarged. Mediastinal contours appear intact. Calcific atherosclerotic disease of the aorta. There is no evidence of pneumothorax. There are persistent bilateral pleural effusions, slightly enlarged from the prior radiograph. Postsurgical/posttreatment changes in the right upper lobe are stable. Stable prominence of the right hilum. Osseous structures are without acute abnormality. Soft tissues are grossly normal. IMPRESSION: Slightly enlarged bilateral pleural effusions with bilateral  lower lobe airspace consolidation versus atelectasis. Stable posttreatment changes in the right upper lobe. Stable prominence of the right hilum. Electronically Signed   By: Fidela Salisbury M.D.   On: 03/24/2016 11:27  Dg Chest Port 1 View  Result Date: 03/20/2016 CLINICAL DATA:  Respiratory failure. EXAM: PORTABLE CHEST 1 VIEW COMPARISON:  03/19/2016.  01/23/2016. FINDINGS: Left IJ line in stable position. Cardiomegaly with progressive bilateral pulmonary interstitial prominence and bilateral pleural effusions consistent with progressive congestive heart failure. Persistent density in the right upper lobe without significant interim change and most likely related to radiation change. Surgical clips right upper chest. IMPRESSION: 1.  Left IJ line stable position. 2. Congestive heart failure with bilateral pulmonary interstitial edema and pleural effusions. 3. Persistent density in the right upper lobe most consistent with scarring and/or radiation change. Electronically Signed   By: Marcello Moores  Register   On: 03/20/2016 07:11  Dg Chest Port 1 View  Result Date: 03/19/2016 CLINICAL DATA:  Respiratory failure EXAM: PORTABLE CHEST 1 VIEW COMPARISON:  03/18/2016 FINDINGS: Cardiomegaly. No frank interstitial edema. Moderate left and small right pleural effusions. Associated bilateral lower lobe opacities, likely atelectasis. Stable platelike opacity in the right upper lobe, presumably radiation changes. Left IJ venous catheter terminates in the lower SVC. IMPRESSION: Moderate left and small right pleural effusions. No frank interstitial edema. Electronically Signed   By: Julian Hy M.D.   On: 03/19/2016 11:44  Dg Chest Port 1 View  Result Date: 03/18/2016 CLINICAL DATA:  Respiratory failure. EXAM: PORTABLE CHEST 1 VIEW COMPARISON:  03/17/2016 and CT chest 03/07/2016, 01/21/2016. FINDINGS: Interval extubation. Nasogastric tube is been removed as well. Left IJ central line tip projects over the SVC.  Basilar dependent airspace opacification with left lower lobe collapse/ consolidation. Small right pleural effusion and moderate left pleural effusion. Oblong opacity in the apex of the right hemi thorax is seen with fiducial markers, stable. IMPRESSION: 1. Basilar dependent airspace opacification with bilateral pleural effusions. Findings may be due to congestive heart failure. 2. Left lower lobe collapse/consolidation. 3. Oblong opacity with fiducial markers in the right upper lobe, better evaluated on cross-sectional imaging 03/07/2016 and 01/21/2016. Electronically Signed   By: Lorin Picket M.D.   On: 03/18/2016 07:59   Dg Chest Port 1 View  Result Date: 03/17/2016 CLINICAL DATA:  Acute respiratory failure. EXAM: PORTABLE CHEST 1 VIEW COMPARISON:  Chest radiograph March 16, 2016 FINDINGS: Chest radiograph is enlarged, similar to prior imaging. Calcified aortic knob. Similar pulmonary vascular congestion with increasing RIGHT, similar LEFT small to moderate pleural effusions. Interstitial prominence. Bibasilar consolidation. Similar fullness of the hilum. No pneumothorax. Endotracheal tube tip projects 2.5 cm above the carina. Nasogastric tube past proximal stomach, distal tip not imaged. LEFT internal jugular central venous catheter distal tip projects in mid superior vena cava. Soft tissue planes and included osseous structures are nonsuspicious. Surgical  clips project in LEFT upper abdomen. IMPRESSION: Stable cardiomegaly and pulmonary edema with increasing RIGHT, similar LEFT small to moderate pleural effusions. Underlying consolidation. No apparent change in life-support lines. Electronically Signed   By: Elon Alas M.D.   On: 03/17/2016 04:38   Dg Chest Port 1 View  Result Date: 03/16/2016 CLINICAL DATA:  Line placement EXAM: PORTABLE CHEST 1 VIEW COMPARISON:  March 16, 2016 FINDINGS: The ETT is in good position. A new left central line terminates in mid SVC. No pneumothorax is identified.  Opacity in the medial right upper lung is similar in the interval. The left-sided pleural effusion with underlying opacity is stable. The effusion and opacity in the right base has improved. No change in cardiomegaly. No other interval changes. IMPRESSION: A new left IJ is identified in good position with no pneumothorax. The left-sided effusion and opacity is stable while the right-sided effusion and opacity has improved. Electronically Signed   By: Dorise Bullion III M.D   On: 03/16/2016 18:28   Dg Chest Port 1 View  Result Date: 03/16/2016 CLINICAL DATA:  Status post intubation and NG tube placement. Shortness of breath. EXAM: PORTABLE CHEST 1 VIEW COMPARISON:  Single-view of the chest earlier today. FINDINGS: NG tube is seen with the side port at the gastroesophageal junction and tip just within the stomach. The tube should be advanced approximately 5 cm for better positioning. New endotracheal tube is in place with the tip in good position at the level of the clavicular heads. Bilateral effusions and airspace disease persist without marked change since the study earlier today. IMPRESSION: ET tube in good position. NG tube should be advanced approximately 5 cm for better positioning. Bilateral pleural effusions and basilar airspace disease. Electronically Signed   By: Inge Rise M.D.   On: 03/16/2016 15:52   Dg Chest Port 1 View  Result Date: 03/16/2016 CLINICAL DATA:  Difficulty breathing today. EXAM: PORTABLE CHEST 1 VIEW COMPARISON:  Single-view of the chest 03/10/2016 and 03/04/2016. CT chest 03/07/2016. FINDINGS: Moderate bilateral pleural effusions have increased in size since the most recent examination. Associated basilar atelectasis is noted. There is cardiomegaly and mild interstitial edema. IMPRESSION: Increased moderate bilateral pleural effusions and basilar atelectasis. Cardiomegaly and mild interstitial edema. Electronically Signed   By: Inge Rise M.D.   On: 03/16/2016 14:38     Dg Chest Port 1 View  Result Date: 03/10/2016 CLINICAL DATA:  Acute respiratory failure, asthma -COPD, CHF, coronary artery disease EXAM: PORTABLE CHEST 1 VIEW COMPARISON:  Portable chest x-ray of March 04, 2016 and chest CT scan of March 07, 2016 FINDINGS: The lungs are well-expanded. There remain bilateral pleural effusions which have increased slightly in volume. The pulmonary vascularity is less engorged. The interstitial markings are less prominent. The cardiac silhouette remains enlarged. There is calcification in the wall of the aortic arch. IMPRESSION: Mild interval improvement in pulmonary interstitial edema and bilateral pleural effusions. Persistent left basilar atelectasis or pneumonia. Electronically Signed   By: David  Martinique M.D.   On: 03/10/2016 07:51     Assessment and Plan  82 y.o. female  With recurrent hospitalizations for HFpEF and now with some degree of HFrEF. It is thought that perhaps her cardiomyopathy is related to her persistent problems with rapid atrial fibrillation. Medications have been inadequate to control the rate despite high doses of amiodarone and beta blockers.  I had seen her couple weeks ago and had recommended that we consider AV junction ablation. She is as well as  she has been for some time and I think proceeding at this juncture is reasonable.  It is clearly an option with few alternatives for her. Her LV function is modestly impaired and so resynchronization would be ideal; however, her renal insufficiency makes the use of contrast very risky. The hope would be that CRT could be placed perhaps with wire mapping. If he couldn't be accomplished, the secondary hope would be that her cardiomyopathy is in fact rate related and the control of her rate will improve LV function.  We will continue diuresis.  She is unable to lie flat. The question I would have for pulmonary/CCM will be the likelihood of being able to relatively easily extubate her following an  anesthesia assisted procedure.  I discussed the case with Dr. Carlyn Reichert. The plan, if she came to stay stable and pulmonary has no problems, would be to transfer to [facility] on Thursday for procedure on Friday.  She should remain on anticoagulation. Foregoing dosing the night before in the morning of the procedure seems reasonable. I suspect her anemia is at least significant related to her renal insufficiency.      Virl Axe  03/28/2016, 5:13 PM

## 2016-03-28 NOTE — Progress Notes (Signed)
Patient: Regina Burke / Admit Date: 03/16/2016 / Date of Encounter: 03/28/2016, 1:01 PM   Subjective: Lasix drip turned off this morning Feels better, had more than 1 L urine output net negative BiPAP last night and again while sleeping this morning Feels improved, still not at her baseline, has not been ambulating Son in the room today, long discussion about active medical issues Discussed case with Dr. Caryl Comes, EP Heart rate continues to run at least 100 at rest, atrial fibrillation on telemetry Underlying anemia with hematocrit 25, no significant improvement She reports leg edema has improved Mild cough when supine Creatinine slowly improving  Review of Systems: Review of Systems  Constitutional: Positive for malaise/fatigue.  Respiratory: Positive for cough and shortness of breath.   Cardiovascular: Negative.   Gastrointestinal: Negative.   Musculoskeletal: Negative.   Neurological: Positive for weakness.  Psychiatric/Behavioral: Negative.   All other systems reviewed and are negative.   Objective: Telemetry: Atrial fibrillation with rate 100 up to 110 bpm Physical Exam: Blood pressure 116/63, pulse 99, temperature 97.6 F (36.4 C), resp. rate 20, height '5\' 5"'$  (1.651 m), weight 191 lb 4.8 oz (86.8 kg), SpO2 100 %. Body mass index is 31.83 kg/m. General: Well developed, well nourished, in no acute distress. Nasal cannula oxygen in place Head: Normocephalic, atraumatic, sclera non-icteric, no xanthomas, nares are without discharge. Neck: Negative for carotid bruits. JVP 10 cm to ankle of the jaw Lungs: Dullness at the bases bilaterally, crackles to halfway bilaterally, mildly  labored breathing Heart: Irregularly irregular with murmurs, no rubs, or gallops.  Abdomen: Soft, non-tender, non-distended with normoactive bowel sounds. No rebound/guarding. Extremities: No clubbing or cyanosis. No edema. Distal pedal pulses are 2+ and equal bilaterally. Neuro: Alert and oriented  X 3. Moves all extremities spontaneously. Psych:  Responds to questions appropriately with a normal affect.   Intake/Output Summary (Last 24 hours) at 03/28/16 1301 Last data filed at 03/28/16 1145  Gross per 24 hour  Intake              490 ml  Output             2575 ml  Net            -2085 ml    Inpatient Medications:  . amiodarone  400 mg Oral BID  . apixaban  2.5 mg Oral Q12H  . Darbepoetin Alfa  25 mcg Subcutaneous Once  . ferrous sulfate  325 mg Oral Q breakfast  . fluticasone furoate-vilanterol  1 puff Inhalation Daily  . furosemide  40 mg Intravenous Q12H  . guaiFENesin  600 mg Oral BID  . insulin aspart  0-15 Units Subcutaneous TID WC  . insulin aspart  0-5 Units Subcutaneous QHS  . metoprolol tartrate  50 mg Oral BID  . pantoprazole  40 mg Oral Q1200  . sertraline  50 mg Oral Daily  . sodium chloride flush  10-40 mL Intracatheter Q12H  . tiotropium  18 mcg Inhalation Daily   Infusions:    Labs:  Recent Labs  03/28/16 0500 03/28/16 0517  NA 140 141  K 4.4 4.4  CL 99* 99*  CO2 33* 33*  GLUCOSE 144* 147*  BUN 81* 83*  CREATININE 3.60* 3.41*  CALCIUM 8.5* 8.6*  PHOS 4.5  --     Recent Labs  03/28/16 0500  ALBUMIN 3.4*    Recent Labs  03/27/16 0518 03/28/16 0517  WBC 5.4 4.4  HGB 8.6* 8.5*  HCT 26.0* 25.4*  MCV 81.8 81.7  PLT 156 149*   No results for input(s): CKTOTAL, CKMB, TROPONINI in the last 72 hours. Invalid input(s): POCBNP No results for input(s): HGBA1C in the last 72 hours.   Weights: Filed Weights   03/26/16 0419 03/27/16 0437 03/28/16 0431  Weight: 194 lb 4.8 oz (88.1 kg) 192 lb 3.2 oz (87.2 kg) 191 lb 4.8 oz (86.8 kg)     Radiology/Studies:  Dg Chest 1 View  Result Date: 03/04/2016 CLINICAL DATA:  Respiratory distress EXAM: CHEST 1 VIEW COMPARISON:  Chest radiograph dated 01/24/2016. CT chest dated 01/21/2016. FINDINGS: Patchy bilateral lower lobe opacities, suspicious for pneumonia, less likely atelectasis. Superimposed  mild interstitial edema is suspected. Small bilateral pleural effusions. No pneumothorax. Again noted is a stable focal opacity in the medial right upper lobe, most of which likely reflects radiation changes when correlating with prior CT, underlying residual/recurrent tumor not excluded. Cardiomegaly. IMPRESSION: Patchy bilateral lower lobe opacities, suspicious for pneumonia, less likely atelectasis. Superimposed mild interstitial edema is suspected. Small bilateral pleural effusions. Stable focal opacity in the medial right upper lobe, most of which likely reflects radiation changes when correlating with prior CT, underlying residual/recurrent tumor not excluded. Electronically Signed   By: Julian Hy M.D.   On: 03/04/2016 21:22   Dg Chest 2 View  Result Date: 03/27/2016 CLINICAL DATA:  Pulmonary edema EXAM: CHEST  2 VIEW COMPARISON:  03/24/2010 FINDINGS: Cardiac shadow is stable. Aortic calcifications are again seen. Bilateral pleural effusions are again noted. Increasing bibasilar infiltrates are seen when compare with the prior exam. Left jugular central line is again noted and stable. IMPRESSION: Stable pleural effusions with increasing bibasilar infiltrates. Electronically Signed   By: Inez Catalina M.D.   On: 03/27/2016 11:16  Dg Abd 1 View  Result Date: 03/16/2016 CLINICAL DATA:  Evaluate OG tube EXAM: ABDOMEN - 1 VIEW COMPARISON:  None. FINDINGS: The side port of the OG tube is near the GE junction. The distal tip is in the stomach. See the chest x-ray report from the same time for description of the lung bases. No other acute abnormalities. IMPRESSION: The side port of the OG tube is near the GE junction. Consider advancement of the tube further into the stomach. Electronically Signed   By: Dorise Bullion III M.D   On: 03/16/2016 18:28   Ct Chest Wo Contrast  Result Date: 03/07/2016 CLINICAL DATA:  History of COPD with respiratory distress for the past 3 days EXAM: CT CHEST WITHOUT  CONTRAST TECHNIQUE: Multidetector CT imaging of the chest was performed following the standard protocol without IV contrast. COMPARISON:  03/04/2016, 01/21/2016 FINDINGS: Cardiovascular: Calcifications of the thoracic aorta are noted without aneurysmal dilatation. The cardiac structures show coronary calcifications. Mediastinum/Nodes: The thoracic inlet demonstrates some nodularity within the right lobe of the thyroid. This is stable from the prior exam. No significant hilar or mediastinal adenopathy is noted. Lungs/Pleura: Bilateral pleural effusions are seen left slightly greater than right. Some associated lower lobe consolidation is noted again left greater than right. These changes are relatively stable from the prior exam. The ground-glass density seen in the left upper lobe is again identified and has improved somewhat in the interval from the prior exam. Scarring in the right upper lobe is again noted stable from the prior exam as well. No new focal infiltrate is seen. Upper Abdomen: Stable cystic lesions are again seen in the spleen. Right adrenal lesion is again noted and stable. Musculoskeletal: No acute bony abnormality noted. IMPRESSION: Bilateral pleural  effusions and associated lower lobe consolidation stable from the prior study. Stable changes in the right upper lobe similar to that seen on the recent exam. Slight improvement in the ground-glass density within the left upper lobe. Electronically Signed   By: Inez Catalina M.D.   On: 03/07/2016 16:36   Dg Chest Port 1 View  Result Date: 03/24/2016 CLINICAL DATA:  Dyspnea.  History of asthma, COPD, lung cancer. EXAM: PORTABLE CHEST 1 VIEW COMPARISON:  03/20/2016 FINDINGS: Left internal jugular approach central venous catheter unchanged. Cardiomediastinal silhouette is mildly enlarged. Mediastinal contours appear intact. Calcific atherosclerotic disease of the aorta. There is no evidence of pneumothorax. There are persistent bilateral pleural  effusions, slightly enlarged from the prior radiograph. Postsurgical/posttreatment changes in the right upper lobe are stable. Stable prominence of the right hilum. Osseous structures are without acute abnormality. Soft tissues are grossly normal. IMPRESSION: Slightly enlarged bilateral pleural effusions with bilateral lower lobe airspace consolidation versus atelectasis. Stable posttreatment changes in the right upper lobe. Stable prominence of the right hilum. Electronically Signed   By: Fidela Salisbury M.D.   On: 03/24/2016 11:27  Dg Chest Port 1 View  Result Date: 03/20/2016 CLINICAL DATA:  Respiratory failure. EXAM: PORTABLE CHEST 1 VIEW COMPARISON:  03/19/2016.  01/23/2016. FINDINGS: Left IJ line in stable position. Cardiomegaly with progressive bilateral pulmonary interstitial prominence and bilateral pleural effusions consistent with progressive congestive heart failure. Persistent density in the right upper lobe without significant interim change and most likely related to radiation change. Surgical clips right upper chest. IMPRESSION: 1.  Left IJ line stable position. 2. Congestive heart failure with bilateral pulmonary interstitial edema and pleural effusions. 3. Persistent density in the right upper lobe most consistent with scarring and/or radiation change. Electronically Signed   By: Marcello Moores  Register   On: 03/20/2016 07:11  Dg Chest Port 1 View  Result Date: 03/19/2016 CLINICAL DATA:  Respiratory failure EXAM: PORTABLE CHEST 1 VIEW COMPARISON:  03/18/2016 FINDINGS: Cardiomegaly. No frank interstitial edema. Moderate left and small right pleural effusions. Associated bilateral lower lobe opacities, likely atelectasis. Stable platelike opacity in the right upper lobe, presumably radiation changes. Left IJ venous catheter terminates in the lower SVC. IMPRESSION: Moderate left and small right pleural effusions. No frank interstitial edema. Electronically Signed   By: Julian Hy M.D.    On: 03/19/2016 11:44  Dg Chest Port 1 View  Result Date: 03/18/2016 CLINICAL DATA:  Respiratory failure. EXAM: PORTABLE CHEST 1 VIEW COMPARISON:  03/17/2016 and CT chest 03/07/2016, 01/21/2016. FINDINGS: Interval extubation. Nasogastric tube is been removed as well. Left IJ central line tip projects over the SVC. Basilar dependent airspace opacification with left lower lobe collapse/ consolidation. Small right pleural effusion and moderate left pleural effusion. Oblong opacity in the apex of the right hemi thorax is seen with fiducial markers, stable. IMPRESSION: 1. Basilar dependent airspace opacification with bilateral pleural effusions. Findings may be due to congestive heart failure. 2. Left lower lobe collapse/consolidation. 3. Oblong opacity with fiducial markers in the right upper lobe, better evaluated on cross-sectional imaging 03/07/2016 and 01/21/2016. Electronically Signed   By: Lorin Picket M.D.   On: 03/18/2016 07:59   Dg Chest Port 1 View  Result Date: 03/17/2016 CLINICAL DATA:  Acute respiratory failure. EXAM: PORTABLE CHEST 1 VIEW COMPARISON:  Chest radiograph March 16, 2016 FINDINGS: Chest radiograph is enlarged, similar to prior imaging. Calcified aortic knob. Similar pulmonary vascular congestion with increasing RIGHT, similar LEFT small to moderate pleural effusions. Interstitial prominence. Bibasilar consolidation. Similar  fullness of the hilum. No pneumothorax. Endotracheal tube tip projects 2.5 cm above the carina. Nasogastric tube past proximal stomach, distal tip not imaged. LEFT internal jugular central venous catheter distal tip projects in mid superior vena cava. Soft tissue planes and included osseous structures are nonsuspicious. Surgical clips project in LEFT upper abdomen. IMPRESSION: Stable cardiomegaly and pulmonary edema with increasing RIGHT, similar LEFT small to moderate pleural effusions. Underlying consolidation. No apparent change in life-support lines.  Electronically Signed   By: Elon Alas M.D.   On: 03/17/2016 04:38   Dg Chest Port 1 View  Result Date: 03/16/2016 CLINICAL DATA:  Line placement EXAM: PORTABLE CHEST 1 VIEW COMPARISON:  March 16, 2016 FINDINGS: The ETT is in good position. A new left central line terminates in mid SVC. No pneumothorax is identified. Opacity in the medial right upper lung is similar in the interval. The left-sided pleural effusion with underlying opacity is stable. The effusion and opacity in the right base has improved. No change in cardiomegaly. No other interval changes. IMPRESSION: A new left IJ is identified in good position with no pneumothorax. The left-sided effusion and opacity is stable while the right-sided effusion and opacity has improved. Electronically Signed   By: Dorise Bullion III M.D   On: 03/16/2016 18:28   Dg Chest Port 1 View  Result Date: 03/16/2016 CLINICAL DATA:  Status post intubation and NG tube placement. Shortness of breath. EXAM: PORTABLE CHEST 1 VIEW COMPARISON:  Single-view of the chest earlier today. FINDINGS: NG tube is seen with the side port at the gastroesophageal junction and tip just within the stomach. The tube should be advanced approximately 5 cm for better positioning. New endotracheal tube is in place with the tip in good position at the level of the clavicular heads. Bilateral effusions and airspace disease persist without marked change since the study earlier today. IMPRESSION: ET tube in good position. NG tube should be advanced approximately 5 cm for better positioning. Bilateral pleural effusions and basilar airspace disease. Electronically Signed   By: Inge Rise M.D.   On: 03/16/2016 15:52   Dg Chest Port 1 View  Result Date: 03/16/2016 CLINICAL DATA:  Difficulty breathing today. EXAM: PORTABLE CHEST 1 VIEW COMPARISON:  Single-view of the chest 03/10/2016 and 03/04/2016. CT chest 03/07/2016. FINDINGS: Moderate bilateral pleural effusions have increased in  size since the most recent examination. Associated basilar atelectasis is noted. There is cardiomegaly and mild interstitial edema. IMPRESSION: Increased moderate bilateral pleural effusions and basilar atelectasis. Cardiomegaly and mild interstitial edema. Electronically Signed   By: Inge Rise M.D.   On: 03/16/2016 14:38   Dg Chest Port 1 View  Result Date: 03/10/2016 CLINICAL DATA:  Acute respiratory failure, asthma -COPD, CHF, coronary artery disease EXAM: PORTABLE CHEST 1 VIEW COMPARISON:  Portable chest x-ray of March 04, 2016 and chest CT scan of March 07, 2016 FINDINGS: The lungs are well-expanded. There remain bilateral pleural effusions which have increased slightly in volume. The pulmonary vascularity is less engorged. The interstitial markings are less prominent. The cardiac silhouette remains enlarged. There is calcification in the wall of the aortic arch. IMPRESSION: Mild interval improvement in pulmonary interstitial edema and bilateral pleural effusions. Persistent left basilar atelectasis or pneumonia. Electronically Signed   By: David  Martinique M.D.   On: 03/10/2016 07:51     Assessment and Plan  82 y.o. female  1. Acute on chronic respiratory failure with hypoxia and hypercapnia: -Multifactorial including COPD exacerbation  acute on chronic combined CHF,  exacerbated by atrial fibrillation On BiPAP for support when sleeping -Would continue IV Lasix boluses every 6-8 hours  2. Acute on chronic combined CHF:  EF of 35-40%,   suggestive of tachycardia-induced cardiomyopathy  Improvement overnight, 1 L negative in the past 24 hours Would continue IV Lasix boluses  3. Chronic Afib with RVR s/p DCCV last admission: difficult to control rate Case discussed with Dr. Caryl Comes, may need to consider AV node ablation and pacing in [facility] -She did not maintain sinus rhythm s/p recent DCCV, thus the chance of her maintaining sinus rhythm is quite low amiodarone 400 mg bid with  metoprolol 50 mg twice a day for now On anticoagulation -CHADS2VASc at least 7 (CHF, HTN, age x 2, DM, vascular disease, sex category)  4. CAD: -No symptoms concerning for angina -Continue current medical therapy   5. Acute anemia: Scheduled to see hematology as an outpatient As no blood loss anemia, could restart anticoagulation Previously not felt to be iron deficiency Anemia affecting her respiratory status and heart failure  6. Acute on CKD stage III: Suspect ATN, creatinine now trending down Followed by nephrology  Signed, Esmond Plants, MD, Ph.D. [facility] 03/28/2016, 1:01 PM

## 2016-03-29 DIAGNOSIS — I5043 Acute on chronic combined systolic (congestive) and diastolic (congestive) heart failure: Secondary | ICD-10-CM

## 2016-03-29 LAB — GLUCOSE, CAPILLARY
GLUCOSE-CAPILLARY: 181 mg/dL — AB (ref 65–99)
GLUCOSE-CAPILLARY: 110 mg/dL — AB (ref 65–99)
GLUCOSE-CAPILLARY: 129 mg/dL — AB (ref 65–99)
Glucose-Capillary: 137 mg/dL — ABNORMAL HIGH (ref 65–99)

## 2016-03-29 LAB — RENAL FUNCTION PANEL
Albumin: 3.3 g/dL — ABNORMAL LOW (ref 3.5–5.0)
Anion gap: 9 (ref 5–15)
BUN: 84 mg/dL — AB (ref 6–20)
CHLORIDE: 98 mmol/L — AB (ref 101–111)
CO2: 33 mmol/L — AB (ref 22–32)
CREATININE: 3.05 mg/dL — AB (ref 0.44–1.00)
Calcium: 8.4 mg/dL — ABNORMAL LOW (ref 8.9–10.3)
GFR, EST AFRICAN AMERICAN: 16 mL/min — AB (ref >60)
GFR, EST NON AFRICAN AMERICAN: 13 mL/min — AB (ref >60)
Glucose, Bld: 156 mg/dL — ABNORMAL HIGH (ref 65–99)
POTASSIUM: 4 mmol/L (ref 3.5–5.1)
Phosphorus: 3.9 mg/dL (ref 2.5–4.6)
Sodium: 140 mmol/L (ref 135–145)

## 2016-03-29 LAB — PHOSPHORUS: PHOSPHORUS: 3.8 mg/dL (ref 2.5–4.6)

## 2016-03-29 LAB — ECHOCARDIOGRAM COMPLETE
Height: 65 in
Weight: 3060.8 oz

## 2016-03-29 LAB — ALBUMIN: Albumin: 3.3 g/dL — ABNORMAL LOW (ref 3.5–5.0)

## 2016-03-29 MED ORDER — DOCUSATE SODIUM 100 MG PO CAPS
100.0000 mg | ORAL_CAPSULE | Freq: Two times a day (BID) | ORAL | Status: DC
  Administered 2016-03-29 – 2016-03-30 (×3): 100 mg via ORAL
  Filled 2016-03-29 (×3): qty 1

## 2016-03-29 NOTE — Progress Notes (Signed)
[facility]  ROUNDING NOTE   Subjective:   Continues to have orthopnea Furosemide '40mg'$  IV q12  UOP 2150  Objective:  Vital signs in last 24 hours:  Temp:  [97.6 F (36.4 C)-98.7 F (37.1 C)] 97.9 F (36.6 C) (08/02 0749) Pulse Rate:  [86-103] 102 (08/02 0749) Resp:  [16-20] 17 (08/02 0749) BP: (107-116)/(50-63) 109/55 (08/02 0749) SpO2:  [96 %-100 %] 100 % (08/02 0749) Weight:  [85.8 kg (189 lb 3.2 oz)] 85.8 kg (189 lb 3.2 oz) (08/02 0410)  Weight change: -0.953 kg (-2 lb 1.6 oz) Filed Weights   03/27/16 0437 03/28/16 0431 03/29/16 0410  Weight: 87.2 kg (192 lb 3.2 oz) 86.8 kg (191 lb 4.8 oz) 85.8 kg (189 lb 3.2 oz)    Intake/Output: I/O last 3 completed shifts: In: 68 [P.O.:600; I.V.:10] Out: 3250 [Urine:3250]   Intake/Output this shift:  Total I/O In: 270 [P.O.:240; I.V.:30] Out: 0   Physical Exam: General: NAD, sitting up  Head: Normocephalic, atraumatic. Moist oral mucosal membranes  Eyes: Anicteric, PERRL  Neck: Supple, trachea midline  Lungs:  Clear to auscultation  Heart: Regular rate and rhythm  Abdomen:  Soft, nontender,   Extremities: trace peripheral edema.  Neurologic: Nonfocal, moving all four extremities  Skin: No lesions       Basic Metabolic Panel:  Recent Labs Lab 03/26/16 0545 03/27/16 0518 03/28/16 0500 03/28/16 0517 03/29/16 0500  NA 139 139 140 141 140  K 4.7 4.8 4.4 4.4 4.0  CL 99* 100* 99* 99* 98*  CO2 31 31 33* 33* 33*  GLUCOSE 101* 140* 144* 147* 156*  BUN 79* 80* 81* 83* 84*  CREATININE 3.83* 3.72* 3.60* 3.41* 3.05*  CALCIUM 8.5* 8.5* 8.5* 8.6* 8.4*  PHOS  --   --  4.5  --  3.8  3.9    Liver Function Tests:  Recent Labs Lab 03/28/16 0500 03/29/16 0500  ALBUMIN 3.4* 3.3*  3.3*   No results for input(s): LIPASE, AMYLASE in the last 168 hours. No results for input(s): AMMONIA in the last 168 hours.  CBC:  Recent Labs Lab 03/23/16 1000 03/24/16 0940 03/26/16 0545 03/27/16 0518  03/28/16 0517  WBC 8.5 8.5 4.4 5.4 4.4  HGB 9.3* 9.3* 7.9* 8.6* 8.5*  HCT 27.9* 27.8* 24.1* 26.0* 25.4*  MCV 79.4* 80.3 81.8 81.8 81.7  PLT 182 174 132* 156 149*    Cardiac Enzymes: No results for input(s): CKTOTAL, CKMB, CKMBINDEX, TROPONINI in the last 168 hours.  BNP: Invalid input(s): POCBNP  CBG:  Recent Labs Lab 03/28/16 0729 03/28/16 1138 03/28/16 1651 03/28/16 2100 03/29/16 0706  GLUCAP 130* 155* 128* 166* 33*    Microbiology: Results for orders placed or performed during the hospital encounter of 03/16/16  Culture, blood (routine x 2)     Status: None   Collection Time: 03/16/16  6:12 PM  Result Value Ref Range Status   Specimen Description BLOOD RIGHT HAND  Final   Special Requests BOTTLES DRAWN AEROBIC AND ANAEROBIC 6CC  Final   Culture NO GROWTH 5 DAYS  Final   Report Status 03/21/2016 FINAL  Final  Culture, blood (routine x 2)     Status: None   Collection Time: 03/16/16  6:40 PM  Result Value Ref Range Status   Specimen Description BLOOD LEFT WRIST  Final   Special Requests BOTTLES DRAWN AEROBIC AND ANAEROBIC [place]  Final   Culture NO GROWTH 5 DAYS  Final   Report Status 03/21/2016 FINAL  Final  Coagulation Studies: No results for input(s): LABPROT, INR in the last 72 hours.  Urinalysis: No results for input(s): COLORURINE, LABSPEC, PHURINE, GLUCOSEU, HGBUR, BILIRUBINUR, KETONESUR, PROTEINUR, UROBILINOGEN, NITRITE, LEUKOCYTESUR in the last 72 hours.  Invalid input(s): APPERANCEUR    Imaging: No results found.   Medications:     . amiodarone  400 mg Oral BID  . apixaban  2.5 mg Oral Q12H  . docusate sodium  100 mg Oral BID  . ferrous sulfate  325 mg Oral Q breakfast  . fluticasone furoate-vilanterol  1 puff Inhalation Daily  . furosemide  40 mg Intravenous Q12H  . guaiFENesin  600 mg Oral BID  . insulin aspart  0-15 Units Subcutaneous TID WC  . insulin aspart  0-5 Units Subcutaneous QHS  . metoprolol tartrate  50 mg Oral BID  .  pantoprazole  40 mg Oral Q1200  . sertraline  50 mg Oral Daily  . sodium chloride flush  10-40 mL Intracatheter Q12H  . tiotropium  18 mcg Inhalation Daily   sodium chloride, ALPRAZolam, guaiFENesin, ipratropium, metoprolol, ondansetron (ZOFRAN) IV, sodium chloride flush  Assessment/ Plan:  [name] is a 82 y.o. white femalewith diabetes mellitus type II, coronary artery disease, atrial fibrillation, hypertension, hyperlipidemia, generalized anxiety disorder, gout, anemia, history of lung cancer, COPD, atrial fibrillation, fatty liver disease, GERD, osteoporosis   1. Acute Renal Failure on Chronic kidney disease stage III and proteinuria: Baseline creatinine of 1.5, eGFR of 32. Acute renal failure from cardiorenal syndrome and then overdiuresis -  IV furosemide '40mg'$  q12. Still low threshold to start dialysis. Discussed with family.   2. Acute respiratory failure secondary to acute systolic heart failure with atrial fibrillation.   - appreciate cardiology input.   3. Hypertension: at goal.  - metoprolol and furosemide.   4. Anemia of chronic kidney disease: hemoglobin 8.5 - Darbopoeitin 25 given subcu 8/1.    LOS: [place], [place] 8/2/201711:01 AM

## 2016-03-29 NOTE — Progress Notes (Signed)
Patient Name: Regina Burke Date of Encounter: 03/29/2016  Hospital Problem List     Principal Problem:   Acute on chronic combined systolic and diastolic CHF, NYHA class 3 (HCC) Active Problems:   Acute on chronic respiratory failure with hypoxia and hypercapnia (HCC) - secondary to COPD exacerbation, now with A. fib RVR   Chronic obstructive pulmonary disease with acute exacerbation (HCC)   Persistent atrial fibrillation ([place]): CHA2DS2-Vasc = ~7. On Xarelto 15 mg (age & renal Fxn)   Respiratory failure (HCC)   Acute respiratory failure (HCC)   Systolic and diastolic CHF, acute on chronic (HCC)   Centrilobular emphysema (HCC)   Congestive dilated cardiomyopathy (HCC)   Coronary artery disease involving native coronary artery of native heart without angina pectoris    Subjective   Good output yesterday and she felt better throughout the day yesterday but was still orthopneic last night and wore bipap overnight.  No dyspnea sitting up this AM.  Eager to go to [facility] tomorrow for ablation and pacer.  Inpatient Medications    . amiodarone  400 mg Oral BID  . apixaban  2.5 mg Oral Q12H  . docusate sodium  100 mg Oral BID  . ferrous sulfate  325 mg Oral Q breakfast  . fluticasone furoate-vilanterol  1 puff Inhalation Daily  . furosemide  40 mg Intravenous Q12H  . guaiFENesin  600 mg Oral BID  . insulin aspart  0-15 Units Subcutaneous TID WC  . insulin aspart  0-5 Units Subcutaneous QHS  . metoprolol tartrate  50 mg Oral BID  . pantoprazole  40 mg Oral Q1200  . sertraline  50 mg Oral Daily  . sodium chloride flush  10-40 mL Intracatheter Q12H  . tiotropium  18 mcg Inhalation Daily    Vital Signs    Vitals:   03/28/16 1924 03/28/16 2134 03/29/16 0410 03/29/16 0749  BP: 114/61  (!) 107/50 (!) 109/55  Pulse: (!) 103  86 (!) 102  Resp: '16  18 17  '$ Temp: 98.7 F (37.1 C)  98.1 F (36.7 C) 97.9 F (36.6 C)  TempSrc: Oral  Oral Oral  SpO2: 98% 96% 99% 100%  Weight:   189 lb  3.2 oz (85.8 kg)   Height:        Intake/Output Summary (Last 24 hours) at 03/29/16 1105 Last data filed at 03/29/16 1018  Gross per 24 hour  Intake              390 ml  Output             2150 ml  Net            -1760 ml   Filed Weights   03/27/16 0437 03/28/16 0431 03/29/16 0410  Weight: 192 lb 3.2 oz (87.2 kg) 191 lb 4.8 oz (86.8 kg) 189 lb 3.2 oz (85.8 kg)    Physical Exam    General: Pleasant, NAD. Neuro: Alert and oriented X 3. Moves all extremities spontaneously. Psych: Normal affect. HEENT:  Normal  Neck: Supple without bruits.  JVP ~ 14 cm. Lungs:  Resp regular and unlabored, diminished breath sounds @ bases with bibasilar crackles. Heart: IR, IR, distant. Abdomen: protuberant, BS + x 4.  Extremities: No clubbing, cyanosis.  1-2+ bilat LE edema. DP/PT/Radials 1+ and equal bilaterally.  Labs    CBC  Recent Labs  03/27/16 0518 03/28/16 0517  WBC 5.4 4.4  HGB 8.6* 8.5*  HCT 26.0* 25.4*  MCV 81.8 81.7  PLT  156 355*   Basic Metabolic Panel  Recent Labs  03/28/16 0500 03/28/16 0517 03/29/16 0500  NA 140 141 140  K 4.4 4.4 4.0  CL 99* 99* 98*  CO2 33* 33* 33*  GLUCOSE 144* 147* 156*  BUN 81* 83* 84*  CREATININE 3.60* 3.41* 3.05*  CALCIUM 8.5* 8.6* 8.4*  PHOS 4.5  --  3.8  3.9   Liver Function Tests  Recent Labs  03/28/16 0500 03/29/16 0500  ALBUMIN 3.4* 3.3*  3.3*    Telemetry   Afib 90's  Radiology    No new studies  2D Echocardiogram 7.28.2017  Study Conclusions  - Left ventricle: The cavity size was normal. There was mild   concentric hypertrophy. Rhythm still atrial fibrillation with VR   in 100-110s. Study used definity contrast for LV opacifcation.   Systolic function appears moderately reduced. The estimated   ejection fraction was in the range of 35% to 40%. Abnormal septal   motion due to LBBB appears to contribute to this significantly. - Ventricular septum: Septal motion showed abnormal function and   paradox.  These changes are consistent with a left bundle branch   block. - Left atrium: The atrium was severely dilated. - Right atrium: The atrium was moderately dilated. - Tricuspid valve: There was moderate-severe regurgitation. - Pulmonary arteries: Systolic pressure was moderately increased. - Pericardium, extracardiac: There was a left pleural effusion.  Assessment & Plan    1.  Acute on chronic combined systolic and diastolic CHF:  In setting of persistent AFib and resp failure.  Echo 7/22 with EF of 35-40%, diff HK.  She has been diuresed, now on boluses of lasix with good output - minus 1.78L overnight and 2.4 L for admission.  Wt down to 189 lbs on 8/1 (was 194 on 7/30; 191 on 7/31).  Renal fxn stable and nephrology following. Cont lasix 40 IV bid.  2.  Persistent AFib:  Contributing to #1.  S/p prior DCCV.  Remains on amio 400 bid for rate control.  Anticoagulated with dose adjusted eliquis.  Seen by EP yesterday with plan for tx to [facility] on 8/3 for PPM and [facility]CA on 8/5.  She has been unable to lie flat 2/2 orthopnea.  Cont diuresis.  Seen by CCM on 8/1 re: resp risks related to intubation for PPM.  She is felt to be @ moderate risk for pulm complications and pulm has recommended bipap before and after the procedure to minimize risks.  Eliquis will need to be held on the evening of 8/3.  3.  CAD:  No symptoms concerning for angina.  Cont  blocker.  4.  Acute on chronic resp failure w/ hypoxia and hypercapnea:  Multifactorial in setting of above along with COPD.  Using bipap prn.  Cont inhalers and diuresis.  5.  Acute anemia:  Stable.  outpt heme f/u.  6.  Acute on chronic stage IV kidney dzs:  Stable on diuresis.  Nephrology following.  Signed, Murray Hodgkins NP

## 2016-03-29 NOTE — Progress Notes (Signed)
[place] [facility] [place] NAME: Regina Burke    MR#:  [id number]  DATE OF BIRTH:  1932/10/15  SUBJECTIVE:  CHIEF COMPLAINT:   Chief Complaint  Patient presents with  . Shortness of Breath   - Feels more tired and short of breath today - didn't sleep well last night. Renal function slowly improving, on IV lasix BID - to be transferred to [facility] tomorrow for AV nodal ablation planned for Friday  REVIEW OF SYSTEMS:  Review of Systems  Constitutional: Negative for chills, fever and malaise/fatigue.  HENT: Negative for ear discharge, ear pain and nosebleeds.   Respiratory: Positive for shortness of breath. Negative for cough and wheezing.   Cardiovascular: Positive for palpitations and orthopnea. Negative for chest pain.  Gastrointestinal: Negative for abdominal pain, constipation, diarrhea, nausea and vomiting.  Genitourinary: Negative for dysuria and urgency.       Decreased urination  Musculoskeletal: Positive for back pain. Negative for myalgias.  Neurological: Negative for dizziness, speech change, focal weakness, seizures and headaches.    DRUG ALLERGIES:   Allergies  Allergen Reactions  . Ciprofloxacin Shortness Of Breath, Itching and Rash  . Doxycycline Shortness Of Breath, Itching and Rash  . Penicillins Shortness Of Breath, Itching, Rash and Other (See Comments)    Has patient had a PCN reaction causing immediate rash, facial/tongue/throat swelling, SOB or lightheadedness with hypotension: Yes Has patient had a PCN reaction causing severe rash involving mucus membranes or skin necrosis: No Has patient had a PCN reaction that required hospitalization No Has patient had a PCN reaction occurring within the last 10 years: No If all of the above answers are "NO", then may proceed with Cephalosporin use.  . Sulfa Antibiotics Shortness Of Breath, Itching and Rash  . Morphine And Related Itching  . Albuterol Sulfate   . Cefuroxime  Rash    blisters    VITALS:  Blood pressure (!) 109/55, pulse (!) 102, temperature 97.9 F (36.6 C), temperature source Oral, resp. rate 17, height '5\' 5"'$  (1.651 m), weight 85.8 kg (189 lb 3.2 oz), SpO2 100 %.  PHYSICAL EXAMINATION:  Physical Exam  GENERAL:  82 y.o.-year-old obese patient lying in the bed, Not in any acute distress.  EYES: Pupils equal, round, reactive to light and accommodation. No scleral icterus. Extraocular muscles intact.  HEENT: Head atraumatic, normocephalic. Oropharynx and nasopharynx clear.  NECK:  Supple, no jugular venous distention. No thyroid enlargement, no tenderness.  LUNGS: Normal breath sounds bilaterally, no wheezing, rhonchi or crepitation. No use of accessory muscles of respiration. Bibasilar crackles heard. CARDIOVASCULAR: S1, S2 normal. No murmurs, rubs, or gallops.  ABDOMEN: Soft, obese, nontender, nondistended. Bowel sounds present. No organomegaly or mass.  EXTREMITIES: No cyanosis, or clubbing. No pedal edema NEUROLOGIC: Cranial nerves II through XII are intact. Muscle strength 5/5 in all extremities. Sensation intact. Gait not checked. Global weakness PSYCHIATRIC: The patient is alert and oriented x 3.  SKIN: No obvious rash, lesion, or ulcer.    LABORATORY PANEL:   CBC  Recent Labs Lab 03/28/16 0517  WBC 4.4  HGB 8.5*  HCT 25.4*  PLT 149*   ------------------------------------------------------------------------------------------------------------------  Chemistries   Recent Labs Lab 03/29/16 0500  NA 140  K 4.0  CL 98*  CO2 33*  GLUCOSE 156*  BUN 84*  CREATININE 3.05*  CALCIUM 8.4*   ------------------------------------------------------------------------------------------------------------------  Cardiac Enzymes No results for input(s): TROPONINI in the last 168 hours. ------------------------------------------------------------------------------------------------------------------  RADIOLOGY:  Dg Chest 2  View  Result Date: 03/27/2016 CLINICAL DATA:  Pulmonary edema EXAM: CHEST  2 VIEW COMPARISON:  03/24/2010 FINDINGS: Cardiac shadow is stable. Aortic calcifications are again seen. Bilateral pleural effusions are again noted. Increasing bibasilar infiltrates are seen when compare with the prior exam. Left jugular central line is again noted and stable. IMPRESSION: Stable pleural effusions with increasing bibasilar infiltrates. Electronically Signed   By: Inez Catalina M.D.   On: 03/27/2016 11:16   EKG:   Orders placed or performed during the hospital encounter of 03/04/16  . EKG 12-Lead  . EKG 12-Lead  . EKG 12-Lead  . EKG 12-Lead  . EKG    ASSESSMENT AND PLAN:   82 yo female with a PMH of Type II diabetes, anemia, GERD, cushing's disease, asthma, CKD stage III, pneumonia, edema, paroxysmal atrial fibrillation, HTN, lung cancer s/p radiation 2015, CAD, NSTEMI, HTN, diverticulitis, and gout. She presented to [facility] ER via EMS on 7/20 with c/o worsening shortness of breath.   #1 Acute on chronic respiratory failure with hypoxemia and hypercapnia-secondary to acute on chronic combined CHF exacerbation. - was on lasix drip now with improvement- changed to IV lasix bid  -Continue BiPAP at bedtime and as needed. On 3 L oxygen.  #2 ARF on CKD-  Baseline at 1.8, worsened and now improving after starting lasix drip. - changed to IV lasix, secondary to cardiorenal syndrome -Appreciate Nephrology consult- foley catheter. Hold nephrotoxins  #3 COPD-stable at this time. Patient allergic to albuterol and Xopenex. -Continue Atrovent nebs. Not on systemic steroids. -Continue inhalers - Pulmonary to eval her resp status for ablation at [facility] later this week. Appreciate their input- moderate risk, use Bipap recommended  #4 chronic atrial fibrillation-with difficult to control heart rate. -Continue amiodarone orally. Also on metoprolol. -Had developed each digitoxin toxicity twice in the past, so no  digoxin at this time. -EP consult for  AV nodal ablation.Likely scheduled for Friday at [facility]  -Failed DC cardioversion in the past -on eliquis for anticoagulation.  #5 acute on chronic anemia-significant iron deficiency and also anemia of chronic disease. - Continue outpatient follow-up with cancer center. -No indication for transfusion at this time. Continue to monitor while on eliquis - epo can be considered  #6 diabetes mellitus-on sliding scale insulin  #7 DVT prophylaxis-on eliquis- renally dosed   Possible transfer to [facility] on Thursday if stable for AV nodal ablation this Friday.   All the records are reviewed and case discussed with Care Management/Social Workerr. Management plans discussed with the patient, family and they are in agreement.  CODE STATUS: DNR  TOTAL TIME TAKING CARE OF THIS PATIENT: 34 minutes.    Gladstone Lighter M.D on 03/29/2016 at 9:19 AM  Between 7am to 6pm - Pager - [phone]  After 6pm go to [email or web address] - password [email or web address] [facility]  Office  [phone]  CC: Primary care physician; Tracie Harrier, MD

## 2016-03-29 NOTE — Progress Notes (Signed)
Name: Regina Burke MRN: [id number] DOB: 1932-12-18    ADMISSION DATE:  03/16/2016  HISTORY OF PRESENT ILLNESS:   This is a 82 yo female with a PMH of Type II diabetes, anemia, GERD, cushing's disease, asthma, CKD stage III, pneumonia, edema, paroxysmal atrial fibrillation, HTN, lung cancer s/p radiation 2015, CAD, NSTEMI, HTN, diverticulitis, and gout.  She presented to [facility] ER via EMS on 7/20 with c/o worsening shortness of breath. She was recently admitted to the hospital on 03/04/16 and discharged 03/14/16 for CHF exacerbation and Afibb/RVR and was discharged on her normal supplemental oxygen of 3L.  Per EMS they found her on 3L O2 at home with O2 sats 91% and somnolent.  EMS placed her on bipap however she kept attempting to remove the bipap mask.  She further decompensated in the ER and required intubation.  PCCM to admit to ICU for acute on chronic hypoxic hypercapnic respiratory failure secondary to pulmonary edema and mechanical ventilation management.  Pt extubated 03/17/16 and transitioned to prn and qhs Bipap.  PCCM reconsulted 8/1 for pulmonology clearance for AV nodal ablation procedure due to chronic respiratory failure history  REVIEW OF SYSTEMS:  Positives in BOLD Constitutional: Negative for fever, chills, weight loss, malaise/fatigue and diaphoresis.  HENT: Negative for hearing loss, ear pain, nosebleeds, congestion, sore throat, neck pain, tinnitus and ear discharge.   Eyes: Negative for blurred vision, double vision, photophobia, pain, discharge and redness.  Respiratory: cough, hemoptysis, sputum production, shortness of breath, wheezing and stridor.   Cardiovascular: chest pain, palpitations, orthopnea, claudication, leg swelling and PND.  Gastrointestinal: heartburn, nausea, vomiting, abdominal pain, diarrhea, constipation, blood in stool and melena.  Genitourinary: Negative for dysuria, urgency, frequency, hematuria and flank pain.  Musculoskeletal: Negative for myalgias,  back pain, joint pain and falls.  Skin: Negative for itching and rash.  Neurological: Negative for dizziness, tingling, tremors, sensory change, speech change, focal weakness, seizures, loss of consciousness, weakness and headaches.  Endo/Heme/Allergies: Negative for environmental allergies and polydipsia. Does not bruise/bleed easily.  SUBJECTIVE:  Pt sitting up in chair currently on 3L O2 via nasal canula states she still has intermittent periods of shortness of breath at rest  VITAL SIGNS: Temp:  [97.6 F (36.4 C)-98.7 F (37.1 C)] 97.9 F (36.6 C) (08/02 0749) Pulse Rate:  [86-103] 102 (08/02 0749) Resp:  [16-20] 17 (08/02 0749) BP: (107-116)/(50-63) 109/55 (08/02 0749) SpO2:  [96 %-100 %] 100 % (08/02 0749) Weight:  [189 lb 3.2 oz (85.8 kg)] 189 lb 3.2 oz (85.8 kg) (08/02 0410)  PHYSICAL EXAMINATION: General:  Well developed, well nourished Neuro:  Alert and oriented, follows commands HEENT:  Supple, no JVD Cardiovascular:  Irregular irregular, rate controlled, no M/R/G Lungs:  Diminished throughout, even, non labored Abdomen: +BS x4, soft, non tender, non distended Musculoskeletal:  Normal bulk and tone Skin:  Intact, no rashes or lesions    ASSESSMENT / PLAN: Acute on Chronic Hypercapnic Hypoxic Respiratory Failure secondary to pulmonary edema OSA (sleep study 06/2015 positive for OSA, required CPAP) Pulmonary Hypertension Hx: Asthma, lung cancer s/p radiation 2015, COPD  PLAN: Continue Bipap prn and qhs and supplemental O2 to maintain O2 sats 88% to 94% Spoke with care management Nann she stated following ablation procedure pt will be discharged to [place] has already been setup at [facility] and prn Atrovent From a respiratory standpoint she is cleared for AV nodal ablation procedure, however she is a moderate risk for pulmonary complications and recommend Bipap  before and after the procedure and minimize use of sedating medications  as possible Will sign off if need further assistance please notify PCCM thank you  Marda Stalker, [place] Pager [phone] (please enter 7 digits) [place] Pager [phone] (please enter 7 digits)   Merton Border, MD PCCM service Mobile [phone] Pager [phone] 03/29/2016

## 2016-03-29 NOTE — Care Management (Signed)
Attending informed CM that patient is to transfer to [facility] 8/3 and to have an ablation on 8/4.  It is anticipated that she will discharge to [facility] from [facility].  There is concern verbalized to report that [facility] is to have the bipap arranged so will be available at the facility when patient discharges.   Discussed with csw and will identify csw that will be involved at [facility] to assist with discharge and Broadus John with [facility] has been updated.

## 2016-03-30 ENCOUNTER — Observation Stay (HOSPITAL_COMMUNITY)
Admission: AD | Admit: 2016-03-30 | Discharge: 2016-04-03 | Disposition: A | Discharge status: Skilled Nursing Facility | Payer: Medicare Other | Source: Other Acute Inpatient Hospital | Attending: Cardiology | Admitting: Cardiology

## 2016-03-30 DIAGNOSIS — J9622 Acute and chronic respiratory failure with hypercapnia: Secondary | ICD-10-CM | POA: Insufficient documentation

## 2016-03-30 DIAGNOSIS — I482 Chronic atrial fibrillation: Secondary | ICD-10-CM | POA: Insufficient documentation

## 2016-03-30 DIAGNOSIS — E876 Hypokalemia: Secondary | ICD-10-CM | POA: Insufficient documentation

## 2016-03-30 DIAGNOSIS — Z85118 Personal history of other malignant neoplasm of bronchus and lung: Secondary | ICD-10-CM | POA: Insufficient documentation

## 2016-03-30 DIAGNOSIS — E1122 Type 2 diabetes mellitus with diabetic chronic kidney disease: Secondary | ICD-10-CM | POA: Insufficient documentation

## 2016-03-30 DIAGNOSIS — D649 Anemia, unspecified: Secondary | ICD-10-CM | POA: Insufficient documentation

## 2016-03-30 DIAGNOSIS — Z87891 Personal history of nicotine dependence: Secondary | ICD-10-CM | POA: Insufficient documentation

## 2016-03-30 DIAGNOSIS — G4733 Obstructive sleep apnea (adult) (pediatric): Secondary | ICD-10-CM | POA: Insufficient documentation

## 2016-03-30 DIAGNOSIS — J441 Chronic obstructive pulmonary disease with (acute) exacerbation: Secondary | ICD-10-CM | POA: Insufficient documentation

## 2016-03-30 DIAGNOSIS — I428 Other cardiomyopathies: Secondary | ICD-10-CM | POA: Insufficient documentation

## 2016-03-30 DIAGNOSIS — Z79899 Other long term (current) drug therapy: Secondary | ICD-10-CM | POA: Insufficient documentation

## 2016-03-30 DIAGNOSIS — E785 Hyperlipidemia, unspecified: Secondary | ICD-10-CM | POA: Insufficient documentation

## 2016-03-30 DIAGNOSIS — I5043 Acute on chronic combined systolic (congestive) and diastolic (congestive) heart failure: Secondary | ICD-10-CM | POA: Diagnosis not present

## 2016-03-30 DIAGNOSIS — N179 Acute kidney failure, unspecified: Secondary | ICD-10-CM | POA: Insufficient documentation

## 2016-03-30 DIAGNOSIS — I447 Left bundle-branch block, unspecified: Secondary | ICD-10-CM | POA: Insufficient documentation

## 2016-03-30 DIAGNOSIS — K219 Gastro-esophageal reflux disease without esophagitis: Secondary | ICD-10-CM | POA: Insufficient documentation

## 2016-03-30 DIAGNOSIS — Z794 Long term (current) use of insulin: Secondary | ICD-10-CM | POA: Insufficient documentation

## 2016-03-30 DIAGNOSIS — N183 Chronic kidney disease, stage 3 (moderate): Secondary | ICD-10-CM | POA: Insufficient documentation

## 2016-03-30 DIAGNOSIS — I2510 Atherosclerotic heart disease of native coronary artery without angina pectoris: Secondary | ICD-10-CM | POA: Insufficient documentation

## 2016-03-30 DIAGNOSIS — I130 Hypertensive heart and chronic kidney disease with heart failure and stage 1 through stage 4 chronic kidney disease, or unspecified chronic kidney disease: Principal | ICD-10-CM | POA: Insufficient documentation

## 2016-03-30 DIAGNOSIS — M109 Gout, unspecified: Secondary | ICD-10-CM | POA: Insufficient documentation

## 2016-03-30 DIAGNOSIS — Z7901 Long term (current) use of anticoagulants: Secondary | ICD-10-CM | POA: Insufficient documentation

## 2016-03-30 DIAGNOSIS — F329 Major depressive disorder, single episode, unspecified: Secondary | ICD-10-CM | POA: Insufficient documentation

## 2016-03-30 DIAGNOSIS — Z923 Personal history of irradiation: Secondary | ICD-10-CM | POA: Insufficient documentation

## 2016-03-30 DIAGNOSIS — Z955 Presence of coronary angioplasty implant and graft: Secondary | ICD-10-CM | POA: Insufficient documentation

## 2016-03-30 DIAGNOSIS — I481 Persistent atrial fibrillation: Secondary | ICD-10-CM | POA: Insufficient documentation

## 2016-03-30 DIAGNOSIS — N189 Chronic kidney disease, unspecified: Secondary | ICD-10-CM | POA: Diagnosis present

## 2016-03-30 DIAGNOSIS — Z9889 Other specified postprocedural states: Secondary | ICD-10-CM

## 2016-03-30 DIAGNOSIS — I480 Paroxysmal atrial fibrillation: Secondary | ICD-10-CM | POA: Insufficient documentation

## 2016-03-30 DIAGNOSIS — I10 Essential (primary) hypertension: Secondary | ICD-10-CM | POA: Diagnosis present

## 2016-03-30 DIAGNOSIS — Z950 Presence of cardiac pacemaker: Secondary | ICD-10-CM | POA: Diagnosis present

## 2016-03-30 DIAGNOSIS — I4819 Other persistent atrial fibrillation: Secondary | ICD-10-CM

## 2016-03-30 DIAGNOSIS — I252 Old myocardial infarction: Secondary | ICD-10-CM | POA: Insufficient documentation

## 2016-03-30 DIAGNOSIS — J9621 Acute and chronic respiratory failure with hypoxia: Secondary | ICD-10-CM | POA: Insufficient documentation

## 2016-03-30 DIAGNOSIS — Z95818 Presence of other cardiac implants and grafts: Secondary | ICD-10-CM

## 2016-03-30 DIAGNOSIS — Z7951 Long term (current) use of inhaled steroids: Secondary | ICD-10-CM | POA: Insufficient documentation

## 2016-03-30 DIAGNOSIS — J449 Chronic obstructive pulmonary disease, unspecified: Secondary | ICD-10-CM | POA: Diagnosis present

## 2016-03-30 HISTORY — DX: Presence of cardiac pacemaker: Z95.0

## 2016-03-30 HISTORY — DX: Other specified postprocedural states: Z98.890

## 2016-03-30 LAB — BASIC METABOLIC PANEL
ANION GAP: 7 (ref 5–15)
BUN: 85 mg/dL — ABNORMAL HIGH (ref 6–20)
CHLORIDE: 97 mmol/L — AB (ref 101–111)
CO2: 35 mmol/L — ABNORMAL HIGH (ref 22–32)
Calcium: 8.2 mg/dL — ABNORMAL LOW (ref 8.9–10.3)
Creatinine, Ser: 2.91 mg/dL — ABNORMAL HIGH (ref 0.44–1.00)
GFR calc non Af Amer: 14 mL/min — ABNORMAL LOW (ref >60)
GFR, EST AFRICAN AMERICAN: 16 mL/min — AB (ref >60)
Glucose, Bld: 107 mg/dL — ABNORMAL HIGH (ref 65–99)
POTASSIUM: 4.1 mmol/L (ref 3.5–5.1)
SODIUM: 139 mmol/L (ref 135–145)

## 2016-03-30 LAB — GLUCOSE, CAPILLARY
GLUCOSE-CAPILLARY: 122 mg/dL — AB (ref 65–99)
GLUCOSE-CAPILLARY: 117 mg/dL — AB (ref 65–99)
GLUCOSE-CAPILLARY: 121 mg/dL — AB (ref 65–99)
GLUCOSE-CAPILLARY: 133 mg/dL — AB (ref 65–99)

## 2016-03-30 MED ORDER — ALPRAZOLAM 0.25 MG PO TABS: 0.2500 mg | ORAL_TABLET | Freq: Every evening | ORAL | 0 refills | Status: DC | PRN

## 2016-03-30 MED ORDER — PANTOPRAZOLE SODIUM 40 MG PO TBEC
40.0000 mg | DELAYED_RELEASE_TABLET | Freq: Every day | ORAL | Status: DC
  Administered 2016-03-30 – 2016-04-03 (×4): 40 mg via ORAL
  Filled 2016-03-30 (×5): qty 1

## 2016-03-30 MED ORDER — FUROSEMIDE 10 MG/ML IJ SOLN: 40.0000 mg | Freq: Two times a day (BID) | INTRAMUSCULAR | 0 refills | Status: DC

## 2016-03-30 MED ORDER — FERROUS SULFATE 325 (65 FE) MG PO TABS
325.0000 mg | ORAL_TABLET | Freq: Every day | ORAL | Status: DC
  Administered 2016-04-01 – 2016-04-03 (×3): 325 mg via ORAL
  Filled 2016-03-30 (×4): qty 1

## 2016-03-30 MED ORDER — GUAIFENESIN ER 600 MG PO TB12
600.0000 mg | ORAL_TABLET | Freq: Two times a day (BID) | ORAL | Status: DC
  Administered 2016-03-30 – 2016-04-03 (×7): 600 mg via ORAL
  Filled 2016-03-30 (×8): qty 1

## 2016-03-30 MED ORDER — ONDANSETRON HCL 4 MG/2ML IJ SOLN: 4.0000 mg | Freq: Four times a day (QID) | INTRAMUSCULAR | Status: DC | PRN

## 2016-03-30 MED ORDER — INSULIN ASPART 100 UNIT/ML ~~LOC~~ SOLN: 0.0000 [IU] | Freq: Three times a day (TID) | SUBCUTANEOUS | 11 refills | Status: DC

## 2016-03-30 MED ORDER — METOPROLOL TARTRATE 5 MG/5ML IV SOLN: 2.5000 mg | INTRAVENOUS | Status: AC

## 2016-03-30 MED ORDER — AMIODARONE HCL 200 MG PO TABS
400.0000 mg | ORAL_TABLET | Freq: Two times a day (BID) | ORAL | Status: DC
  Administered 2016-03-30 – 2016-03-31 (×2): 400 mg via ORAL
  Filled 2016-03-30 (×2): qty 2

## 2016-03-30 MED ORDER — CHLORHEXIDINE GLUCONATE 4 % EX LIQD
60.0000 mL | Freq: Once | CUTANEOUS | Status: AC
  Administered 2016-03-31: 4 via TOPICAL

## 2016-03-30 MED ORDER — ACETAMINOPHEN 325 MG PO TABS
650.0000 mg | ORAL_TABLET | ORAL | Status: DC | PRN
  Filled 2016-03-30 (×2): qty 2

## 2016-03-30 MED ORDER — APIXABAN 2.5 MG PO TABS: 2.5000 mg | ORAL_TABLET | Freq: Two times a day (BID) | ORAL | 0 refills | Status: DC

## 2016-03-30 MED ORDER — METOPROLOL TARTRATE 50 MG PO TABS
50.0000 mg | ORAL_TABLET | Freq: Two times a day (BID) | ORAL | Status: DC
  Administered 2016-03-30 – 2016-04-03 (×7): 50 mg via ORAL
  Filled 2016-03-30 (×8): qty 1

## 2016-03-30 MED ORDER — VANCOMYCIN HCL IN DEXTROSE 1-5 GM/200ML-% IV SOLN
1000.0000 mg | INTRAVENOUS | Status: AC
  Administered 2016-03-31: 1000 mg via INTRAVENOUS
  Filled 2016-03-30: qty 200

## 2016-03-30 MED ORDER — SERTRALINE HCL 50 MG PO TABS
50.0000 mg | ORAL_TABLET | Freq: Every day | ORAL | Status: DC
  Administered 2016-03-31 – 2016-04-03 (×4): 50 mg via ORAL
  Filled 2016-03-30 (×4): qty 1

## 2016-03-30 MED ORDER — TIOTROPIUM BROMIDE MONOHYDRATE 18 MCG IN CAPS
18.0000 ug | ORAL_CAPSULE | Freq: Every day | RESPIRATORY_TRACT | Status: DC
  Administered 2016-03-31 – 2016-04-03 (×4): 18 ug via RESPIRATORY_TRACT
  Filled 2016-03-30 (×2): qty 5

## 2016-03-30 MED ORDER — SODIUM CHLORIDE 0.9 % IV SOLN
INTRAVENOUS | Status: DC
  Administered 2016-03-31: 50 mL/h via INTRAVENOUS

## 2016-03-30 MED ORDER — SODIUM CHLORIDE 0.9% FLUSH
10.0000 mL | Freq: Two times a day (BID) | INTRAVENOUS | Status: DC
  Administered 2016-03-30 – 2016-04-01 (×4): 10 mL via INTRAVENOUS

## 2016-03-30 MED ORDER — ALPRAZOLAM 0.25 MG PO TABS
0.2500 mg | ORAL_TABLET | Freq: Every evening | ORAL | Status: DC | PRN
  Administered 2016-03-30 – 2016-04-02 (×4): 0.25 mg via ORAL
  Filled 2016-03-30 (×4): qty 1

## 2016-03-30 MED ORDER — SODIUM CHLORIDE 0.9 % IR SOLN
80.0000 mg | Status: AC
  Administered 2016-03-31: 80 mg
  Filled 2016-03-30: qty 2

## 2016-03-30 MED ORDER — INSULIN ASPART 100 UNIT/ML ~~LOC~~ SOLN: 0.0000 [IU] | Freq: Every day | SUBCUTANEOUS | 11 refills | Status: DC

## 2016-03-30 MED ORDER — CHLORHEXIDINE GLUCONATE 4 % EX LIQD
60.0000 mL | Freq: Once | CUTANEOUS | Status: AC
  Filled 2016-03-30: qty 60

## 2016-03-30 MED ORDER — DOCUSATE SODIUM 100 MG PO CAPS
100.0000 mg | ORAL_CAPSULE | Freq: Two times a day (BID) | ORAL | Status: DC
  Administered 2016-03-30 – 2016-04-02 (×6): 100 mg via ORAL
  Filled 2016-03-30 (×8): qty 1

## 2016-03-30 MED ORDER — DOCUSATE SODIUM 100 MG PO CAPS
100.0000 mg | ORAL_CAPSULE | Freq: Two times a day (BID) | ORAL | 0 refills | Status: DC
Start: 2016-03-30 — End: 2016-03-30

## 2016-03-30 MED ORDER — FUROSEMIDE 10 MG/ML IJ SOLN
40.0000 mg | Freq: Two times a day (BID) | INTRAMUSCULAR | Status: DC
  Administered 2016-03-30 – 2016-04-02 (×6): 40 mg via INTRAVENOUS
  Filled 2016-03-30 (×6): qty 4

## 2016-03-30 MED ORDER — INSULIN ASPART 100 UNIT/ML ~~LOC~~ SOLN: 0.0000 [IU] | Freq: Three times a day (TID) | SUBCUTANEOUS | Status: DC

## 2016-03-30 MED ORDER — GUAIFENESIN ER 600 MG PO TB12: 600.0000 mg | ORAL_TABLET | Freq: Two times a day (BID) | ORAL | 0 refills | Status: DC

## 2016-03-30 MED ORDER — APIXABAN 2.5 MG PO TABS
2.5000 mg | ORAL_TABLET | Freq: Two times a day (BID) | ORAL | Status: AC
  Administered 2016-03-30: 2.5 mg via ORAL
  Filled 2016-03-30: qty 1

## 2016-03-30 MED ORDER — FLUTICASONE FUROATE-VILANTEROL 100-25 MCG/INH IN AEPB
1.0000 | INHALATION_SPRAY | Freq: Every day | RESPIRATORY_TRACT | Status: DC
  Administered 2016-03-31 – 2016-04-03 (×4): 1 via RESPIRATORY_TRACT
  Filled 2016-03-30: qty 28

## 2016-03-30 MED ORDER — INSULIN ASPART 100 UNIT/ML ~~LOC~~ SOLN
0.0000 [IU] | Freq: Three times a day (TID) | SUBCUTANEOUS | Status: DC
  Administered 2016-03-31: 1 [IU] via SUBCUTANEOUS
  Administered 2016-04-01: 2 [IU] via SUBCUTANEOUS
  Administered 2016-04-01: 1 [IU] via SUBCUTANEOUS
  Administered 2016-04-02 (×3): 2 [IU] via SUBCUTANEOUS
  Administered 2016-04-03 (×2): 1 [IU] via SUBCUTANEOUS

## 2016-03-30 NOTE — Clinical Social Work Placement (Addendum)
   CLINICAL SOCIAL WORK PLACEMENT  NOTE  Date:  03/30/2016  Patient Details  Name: Regina Burke MRN: [id number] Date of Birth: Sep 18, 1932  Clinical Social Work is seeking post-discharge placement for this patient at the [place] level of care (*CSW will initial, date and re-position this form in  chart as items are completed):  Yes   Patient/family provided with [facility] Work Department's list of facilities offering this level of care within the geographic area requested by the patient (or if unable, by the patient's family).  Yes   Patient/family informed of their freedom to choose among providers that offer the needed level of care, that participate in Medicare, Medicaid or managed care program needed by the patient, have an available bed and are willing to accept the patient.  Yes   Patient/family informed of [facility]'s ownership interest in [facility] and [facility], as well as of the fact that they are under no obligation to receive care at these facilities.  PASRR submitted to EDS on       PASRR number received on 03/21/16     Existing PASRR number confirmed on 03/23/16     FL2 transmitted to all facilities in geographic area requested by pt/family on 03/23/16     FL2 transmitted to all facilities within larger geographic area on       Patient informed that his/her managed care company has contracts with or will negotiate with certain facilities, including the following:        Yes   Patient/family informed of bed offers received.  Patient chooses bed at  ([facility])     Physician recommends and patient chooses bed at      Patient to be transferred to  ([facility]) on  .  Patient to be transferred to facility by       Patient family notified on   of transfer.  Name of family member notified:        PHYSICIAN       Additional Comment:    _______________________________________________ Ross Ludwig, [place] 03/30/2016,  10:19 AM

## 2016-03-30 NOTE — Clinical Social Work Note (Addendum)
Patient will be transferring to [facility] hospital today, patient still plans to discharge to [facility] for short term rehab from [facility].  [facility] [place] admission worker Laurann Montana [phone] was updated on 03-29-2016, he will need to be notified ahead of time by CSW when patient will be ready for discharge so he can order Bipap machine.  MSW to provide handoff to [facility] unit CSW once patient's room has been determined.  MSW was updated that patient will be going to [facility], MSW contacted CSW and left message on voice mail with handoff, this MSW to sign off.  Jones Broom. Theressa Piedra, MSW [phone]  Mon-Fri 8a-4:30p 03/30/2016 9:44 AM

## 2016-03-30 NOTE — Discharge Summary (Signed)
[facility] NAME: Regina Burke    MR#:  [id number]  DATE OF BIRTH:  07/15/1933  DATE OF ADMISSION:  03/16/2016   ADMITTING PHYSICIAN: Flora Lipps, MD  DATE OF DISCHARGE: 03/30/2016  PRIMARY CARE PHYSICIAN: Tracie Harrier, MD   ADMISSION DIAGNOSIS:   Acute respiratory failure (HCC) [J96.00]  DISCHARGE DIAGNOSIS:   Principal Problem:   Acute on chronic combined systolic and diastolic CHF, NYHA class 3 (HCC) Active Problems:   Acute on chronic respiratory failure with hypoxia and hypercapnia (HCC) - secondary to COPD exacerbation, now with A. fib RVR   Coronary artery disease involving native coronary artery of native heart without angina pectoris   Centrilobular emphysema (HCC)   Chronic obstructive pulmonary disease with acute exacerbation (HCC)   Persistent atrial fibrillation ([place]): CHA2DS2-Vasc = ~7. On Xarelto 15 mg (age & renal Fxn)   Congestive dilated cardiomyopathy (HCC)   Respiratory failure (HCC)   Acute respiratory failure (HCC)   Systolic and diastolic CHF, acute on chronic ([place])   SECONDARY DIAGNOSIS:   Past Medical History:  Diagnosis Date  . Anemia   . Arthritis   . Asthma   . Chronic diastolic CHF (congestive heart failure) ([place])    a. 10/2015 Echo: EF 55-65%, Gr1 DD, mild MR, mildly dil LA, nl RV fxn, nl PASP.  Marland Kitchen Chronic kidney disease (CKD), stage III (moderate)   . COPD (chronic obstructive pulmonary disease) ([place])   . Coronary artery disease    a. 11/2014 NSTEMI/PCI: LM nl, LAD 18m D1 30, LCX mild dzs, OM1 20p, OM2 232mOM3 90p (2.25x8 Promus Premier DES), RCA nl.   . Cough    CHRONIC AT NIGHT  . Cushing's disease ([facility]  . Depression   . Diverticulitis   . Edema    FEET/LEGS  . GERD (gastroesophageal reflux disease)   . Gout   . History of hiatal hernia   . History of pneumonia   . HLD (hyperlipidemia)   . HOH (hard of hearing)   . Hypertension   . Hypertensive heart disease   .  Lung cancer ([facility] [place] 2014   S/P radiation 2015  . Migraine   . Multiple allergies   . PAF (paroxysmal atrial fibrillation) (HCC)    a. on amio, Toprol XL, Cardizem CD, and Eliquis 2.5 mg bid (age & SCr); b. CHADS2VASc ==> 7 (CHF, HTN, age x 2, DM, vascular disease and sex category)  . Sleep apnea   . [facility]    HOSPITAL COURSE:   [age]o female with a PMH of Type II diabetes, anemia, GERD, cushing's disease, asthma, CKD stage III, pneumonia, edema, paroxysmal atrial fibrillation, HTN, lung cancer s/p radiation 2015, CAD, NSTEMI, HTN, diverticulitis, and gout. [facility] with c/o worsening shortness of breath.   #1 Acute on chronic respiratory failure with hypoxemia and hypercapnia-secondary to acute on chronic combined CHF exacerbation. - was on lasix drip now with improvement- changed to IV lasix bid  -Continue BiPAP at bedtime and as needed. On 3 L oxygen. - urine output of almost 2L now  #2 ARF on CKD-  Baseline at 1.8, worsened and now improving after starting lasix drip. - changed to IV lasix, secondary to cardiorenal syndrome - discharge creatinine at 2.9 today -Appreciate Nephrology consult- foley catheter. Hold nephrotoxins  #3 COPD-stable at this time. Patient allergic to albuterol and Xopenex. -Continue Atrovent nebs. Not  on systemic steroids. -Continue inhalers - Pulmonary to monitor her resp status for ablation at [facility] later this week. Appreciate their input- moderate risk, use Bipap recommended  #4 chronic atrial fibrillation-with difficult to control heart rate. -Continue amiodarone orally. Also on metoprolol. -Had developed each digitoxin toxicity twice in the past, so no digoxin at this time. -EP consult for  AV nodal ablation.scheduled for Friday at [facility].  -Failed DC cardioversion in the past -on eliquis for anticoagulation.  #5 acute on chronic anemia-significant iron deficiency and also anemia of  chronic disease. - Continue outpatient follow-up with cancer center. -No indication for transfusion at this time. Continue to monitor while on eliquis - epo given on 03/28/16  #6 diabetes mellitus-on sliding scale insulin  #7 DVT prophylaxis-on eliquis- renally dosed  [facility]   DISCHARGE CONDITIONS:   Stable  CONSULTS OBTAINED:   Treatment Team:  Leonie Man, MD Gladstone Lighter, MD Lloyd Huger, MD Munsoor Holley Raring, MD  DRUG ALLERGIES:   Allergies  Allergen Reactions  . Ciprofloxacin Shortness Of Breath, Itching and Rash  . Doxycycline Shortness Of Breath, Itching and Rash  . Penicillins Shortness Of Breath, Itching, Rash and Other (See Comments)    Has patient had a PCN reaction causing immediate rash, facial/tongue/throat swelling, SOB or lightheadedness with hypotension: Yes Has patient had a PCN reaction causing severe rash involving mucus membranes or skin necrosis: No Has patient had a PCN reaction that required hospitalization No Has patient had a PCN reaction occurring within the last 10 years: No If all of the above answers are "NO", then may proceed with Cephalosporin use.  . Sulfa Antibiotics Shortness Of Breath, Itching and Rash  . Morphine And Related Itching  . Albuterol Sulfate   . Cefuroxime Rash    blisters   DISCHARGE MEDICATIONS:     Medication List    STOP taking these medications   diltiazem 240 MG 24 hr capsule Commonly known as:  CARDIZEM CD   furosemide 20 MG tablet Commonly known as:  LASIX Replaced by:  furosemide 10 MG/ML injection   Rivaroxaban 15 MG Tabs tablet Commonly known as:  XARELTO     TAKE these medications   ALPRAZolam 0.25 MG tablet Commonly known as:  XANAX Take 1 tablet (0.25 mg total) by mouth at bedtime as needed for anxiety. What changed:  when to take this   amiodarone 400 MG tablet Commonly known as:  PACERONE Take 1 tablet (400 mg total) by mouth 2 (two) times daily.     apixaban 2.5 MG Tabs tablet Commonly known as:  ELIQUIS Take 1 tablet (2.5 mg total) by mouth every 12 (twelve) hours.   atorvastatin 20 MG tablet Commonly known as:  LIPITOR Take 1 tablet (20 mg total) by mouth every evening.   BREO ELLIPTA 100-25 MCG/INH Aepb Generic drug:  fluticasone furoate-vilanterol Inhale 1 puff into the lungs daily.   cholecalciferol 400 units Tabs tablet Commonly known as:  VITAMIN D Take 400 Units by mouth every evening.   cyanocobalamin 500 MCG tablet Take 500 mcg by mouth every evening.   docusate sodium 100 MG capsule Commonly known as:  COLACE Take 1 capsule (100 mg total) by mouth 2 (two) times daily.   febuxostat 40 MG tablet Commonly known as:  ULORIC Take 40 mg by mouth every evening.   fenofibrate 48 MG tablet Commonly known as:  TRICOR Take 48 mg by mouth every evening.   ferrous sulfate 325 (65 FE)  MG EC tablet Take 325 mg by mouth every evening.   furosemide 10 MG/ML injection Commonly known as:  LASIX Inject 4 mLs (40 mg total) into the vein every 12 (twelve) hours. Replaces:  furosemide 20 MG tablet   guaiFENesin 100 MG/5ML Soln Commonly known as:  ROBITUSSIN Take 10 mLs (200 mg total) by mouth every 4 (four) hours as needed for cough or to loosen phlegm.   guaiFENesin 600 MG 12 hr tablet Commonly known as:  MUCINEX Take 1 tablet (600 mg total) by mouth 2 (two) times daily.   insulin aspart 100 UNIT/ML injection Commonly known as:  novoLOG Inject 0-15 Units into the skin 3 (three) times daily with meals. Sliding scale Insulin   insulin aspart 100 UNIT/ML injection Commonly known as:  novoLOG Inject 0-5 Units into the skin at bedtime. SLiding scale insulin   ipratropium 0.02 % nebulizer solution Commonly known as:  ATROVENT Take 2.5 mLs (0.5 mg total) by nebulization every 4 (four) hours as needed for wheezing or shortness of breath.   nitroGLYCERIN 0.4 MG SL tablet Commonly known as:  NITROSTAT Place 1 tablet  (0.4 mg total) under the tongue every 5 (five) minutes as needed for chest pain.   pantoprazole 40 MG tablet Commonly known as:  PROTONIX Take 40 mg by mouth every evening.   potassium chloride SA 20 MEQ tablet Commonly known as:  K-DUR,KLOR-CON Take 1 tablet (20 mEq total) by mouth daily. While taking lasix only   sertraline 50 MG tablet Commonly known as:  ZOLOFT Take 1 tablet (50 mg total) by mouth daily. What changed:  when to take this   tiotropium 18 MCG inhalation capsule Commonly known as:  SPIRIVA Place 18 mcg into inhaler and inhale daily.   TOPROL XL 25 MG 24 hr tablet Generic drug:  metoprolol succinate Take 50 mg by mouth 2 (two) times daily.        DISCHARGE INSTRUCTIONS:    DIET:   Low sodium diet  ACTIVITY:   As tolerated  OXYGEN:   Home Oxygen: Yes.    Oxygen Delivery: 3 liters/min via Patient connected to nasal cannula oxygen  DISCHARGE LOCATION:   [facility]   If you experience worsening of your admission symptoms, develop shortness of breath, life threatening emergency, suicidal or homicidal thoughts you must seek medical attention immediately by calling 911 or calling your MD immediately  if symptoms less severe.  You Must read complete instructions/literature along with all the possible adverse reactions/side effects for all the Medicines you take and that have been prescribed to you. Take any new Medicines after you have completely understood and accpet all the possible adverse reactions/side effects.   Please note  You were cared for by a hospitalist during your hospital stay. If you have any questions about your discharge medications or the care you received while you were in the hospital after you are discharged, you can call the unit and asked to speak with the hospitalist on call if the hospitalist that took care of you is not available. Once you are discharged, your primary care physician will handle any further medical  issues. Please note that NO REFILLS for any discharge medications will be authorized once you are discharged, as it is imperative that you return to your primary care physician (or establish a relationship with a primary care physician if you do not have one) for your aftercare needs so that they can reassess your need for medications and monitor your lab  values.    On the day of Discharge:  VITAL SIGNS:   Blood pressure (!) 97/56, pulse 97, temperature 98.2 F (36.8 C), temperature source Oral, resp. rate 18, height '5\' 5"'$  (1.651 m), weight 86.8 kg (191 lb 4.8 oz), SpO2 99 %.  PHYSICAL EXAMINATION:    GENERAL:  82 y.o.-year-old obese patient lying in the bed, Not in any acute distress.  EYES: Pupils equal, round, reactive to light and accommodation. No scleral icterus. Extraocular muscles intact.  HEENT: Head atraumatic, normocephalic. Oropharynx and nasopharynx clear.  NECK:  Supple, no jugular venous distention. No thyroid enlargement, no tenderness.  LUNGS: Normal breath sounds bilaterally, no wheezing, rhonchi or crepitation. No use of accessory muscles of respiration. Bibasilar crackles heard. Decreased left basilar breath sounds. CARDIOVASCULAR: S1, S2 normal. No murmurs, rubs, or gallops.  ABDOMEN: Soft, obese, nontender, nondistended. Bowel sounds present. No organomegaly or mass.  EXTREMITIES: No cyanosis, or clubbing. 1+ foot edema present today NEUROLOGIC: Cranial nerves II through XII are intact. Muscle strength 5/5 in all extremities. Sensation intact. Gait not checked. Global weakness PSYCHIATRIC: The patient is alert and oriented x 3.  SKIN: No obvious rash, lesion, or ulcer.   DATA REVIEW:   CBC  Recent Labs Lab 03/28/16 0517  WBC 4.4  HGB 8.5*  HCT 25.4*  PLT 149*    Chemistries   Recent Labs Lab 03/30/16 0530  NA 139  K 4.1  CL 97*  CO2 35*  GLUCOSE 107*  BUN 85*  CREATININE 2.91*  CALCIUM 8.2*     Microbiology Results  Results for orders  placed or performed during the hospital encounter of 03/16/16  Culture, blood (routine x 2)     Status: None   Collection Time: 03/16/16  6:12 PM  Result Value Ref Range Status   Specimen Description BLOOD RIGHT HAND  Final   Special Requests BOTTLES DRAWN AEROBIC AND ANAEROBIC 6CC  Final   Culture NO GROWTH 5 DAYS  Final   Report Status 03/21/2016 FINAL  Final  Culture, blood (routine x 2)     Status: None   Collection Time: 03/16/16  6:40 PM  Result Value Ref Range Status   Specimen Description BLOOD LEFT WRIST  Final   Special Requests BOTTLES DRAWN AEROBIC AND ANAEROBIC 8CC  Final   Culture NO GROWTH 5 DAYS  Final   Report Status 03/21/2016 FINAL  Final    RADIOLOGY:  No results found.   Management plans discussed with the patient, family and they are in agreement.  CODE STATUS:     Code Status Orders        Start     Ordered   03/16/16 1611  Do not attempt resuscitation (DNR)  Continuous    Question Answer Comment  In the event of cardiac or respiratory ARREST Do not call a "code blue"   In the event of cardiac or respiratory ARREST Do not perform Intubation, CPR, defibrillation or ACLS   In the event of cardiac or respiratory ARREST Use medication by any route, position, wound care, and other measures to relive pain and suffering. May use oxygen, suction and manual treatment of airway obstruction as needed for comfort.      03/16/16 1610    Code Status History    Date Active Date Inactive Code Status Order ID Comments User Context   03/16/2016  4:10 PM 03/24/2016  7:06 PM DNR [id number]  Awilda Bill, NP ED   03/08/2016  3:43 PM 03/14/2016  7:40  PM Partial Code [id number]  Knox Royalty, NP Inpatient   03/04/2016 10:28 PM 03/08/2016  3:43 PM Full Code [id number]  Holley Raring, NP ED   01/19/2016  7:56 PM 01/27/2016  6:01 PM Partial Code [id number]  Vaughan Basta, MD ED   11/26/2015  4:02 AM 11/26/2015  8:59 PM Full Code [id number]  Silver Huguenin, RN Inpatient    06/14/2015  6:57 PM 06/15/2015  8:54 PM DNR [id number]  Colleen Can, MD Inpatient   06/07/2015 10:20 PM 06/14/2015  6:56 PM Full Code [id number]  Lytle Butte, MD ED   05/15/2015  3:27 PM 05/24/2015  5:04 PM Full Code [id number]  Gladstone Lighter, MD ED   05/08/2015 11:23 AM 05/11/2015  3:13 PM Full Code [id number]  Dustin Flock, MD ED   04/15/2015 12:01 AM 04/15/2015  7:03 PM Full Code [id number]  Lance Coon, MD Inpatient   12/23/2014  1:09 PM 12/24/2014  5:17 PM Full Code [id number]  Burnell Blanks, MD Inpatient   12/23/2014  4:22 AM 12/23/2014  1:09 PM Full Code [id number]  Alwyn Pea, MD ED    Questions for Most Recent Historical Code Status (Order [id number])    Question Answer Comment   In the event of cardiac or respiratory ARREST Do not call a "code blue"    In the event of cardiac or respiratory ARREST Do not perform Intubation, CPR, defibrillation or ACLS    In the event of cardiac or respiratory ARREST Use medication by any route, position, wound care, and other measures to relive pain and suffering. May use oxygen, suction and manual treatment of airway obstruction as needed for comfort.         Advance Directive Documentation   Flowsheet Row Most Recent Value  Type of Advance Directive  Healthcare Power of Attorney  Pre-existing out of facility DNR order (yellow form or pink MOST form)  No data  "MOST" Form in Place?  No data      TOTAL TIME TAKING CARE OF THIS PATIENT: 38  minutes.    Nathania Waldman M.D on 03/30/2016 at 9:05 AM  Between 7am to 6pm - Pager - [phone]  After 6pm go to [email or web address] - Proofreader  [facility]  Office  [phone]  CC: Primary care physician; Tracie Harrier, MD   Note: This dictation was prepared with Dragon dictation along with smaller phrase technology. Any transcriptional errors that result from this process are unintentional.

## 2016-03-30 NOTE — Care Management (Signed)
[facility] for cardiac [facility]. Plan is discharge to [facility] from [facility].

## 2016-03-30 NOTE — Progress Notes (Signed)
PT Cancellation Note  Patient Details Name: Regina Burke MRN: [id number] DOB: 09-Oct-1932   Cancelled Treatment:    Reason Eval/Treat Not Completed: Patient not medically ready;Other (comment). MD orders still note bedrest until discontinued at this time. Plan is for patient to have pacemaker placed and ablation procedure tomorrow. Will defer PT eval until after procedure to determine appropriate follow up needs. Discussed and agreed upon with RN Merleen Nicely.   Canary Brim Ivory Broad, PT, DPT  03/30/2016, 2:32 PM

## 2016-03-30 NOTE — H&P (Signed)
History and Physical  Patient ID: Regina Burke MRN: [id number], DOB: [birth year] Admit Date: (Not on file) Date of Encounter: 03/30/2016, 9:39 AM Primary Physician: Tracie Harrier, MD Primary Cardiologist: Dr. Rockey Situ, MD  Chief Complaint: SOB Reason for Admission: Persistent difficult to control Afib with RVR with acute on chronic respiratory failure and acute on chronic combined CHF  HPI: 82 y.o. female with h/o CAD s/p remote PCI, persistent difficult control Afib with RVR s/p recent prior DCCV which did not hold, anemia which has led to prior intermittnet usage of anticoagulation, chronic combined CHF, chronic respiratory failure 2/2 COPD, lung cancer s/p radiation, DM2, asthma, CKD stage III, cushing's disease, hypertensive heart disease, digoxin toxicity in the setting of renal failure, LBBB, HLD, and OSA who has been admitted to [facility] x 2 recently for acute on chronic respiratory failure, volume overload and difficult to control persistent Afib with RVR.   She was admitted in 11/2014 for chest pain and had PCI to OM branch. Prior EF from 10/2015 was 55-60% despite having frequent episodes of Afib and her CAD. She has previously been maintained on metoprolol, cardizem, and amiodarone. However, as of late her rate has been difficult to control leading to volume overload. She was recently admitted in early July for CHF exacerbation with persistent Afib with RVR s/p DCCV that was successful for less than 2 hours with difficulty noted coming out of sedation. EF was not assessed. She was seen by Dr. Caryl Comes, MD who discussed AV node ablation with PPM implantation. She was discharged before this could be done. She returned to [facility] in late July with recurrent SOB, volume overload, and persistent Afib with RVR. She required diuresis with Lasix gtt given renal disease as well as BiPAP. She has been weaned off Lasix gtt and is now on IV push of lasix. Renal function continues to improve. The most controlled  her heart rate has been is in the 90's bpm, with most of her her time spent running in the 1-teens. BP has precluded continued usage of her Cardizem. She has been continued on Lopressor, amiodarone, and dosage adjusted Eliquis. She has diuresed 4 L for the admission. Echo from admission on 7/22 showed a newly depressed EF of 35-40%, diffuse HK, mild MR, LA moderately dilated, PASP 73 mm Hg. After she was reasonably well rate controlled a repeat echo was done on 7/28 that showed EF 35-40%, LA severely dilated, moderately increased PASP, left pleural effusion. Recheck echo on 8/1 showed an EF of 30-35%, moderate MR, LA moderately dilated, PASP could not be estimated. She was seen by Dr. Caryl Comes again with plans to proceed with AV node ablation with implantation of BiV-ICD at [facility] on 8/4 given her symptoms and continued depressed EF on echo, even with rate control. She is currently breathing better.   Past Medical History:  Diagnosis Date  . Anemia   . Arthritis   . Asthma   . Chronic diastolic CHF (congestive heart failure) ([place])    a. 10/2015 Echo: EF 55-65%, Gr1 DD, mild MR, mildly dil LA, nl RV fxn, nl PASP.  Marland Kitchen Chronic kidney disease (CKD), stage III (moderate)   . COPD (chronic obstructive pulmonary disease) ([place])   . Coronary artery disease    a. 11/2014 NSTEMI/PCI: LM nl, LAD 51m D1 30, LCX mild dzs, OM1 20p, OM2 265mOM3 90p (2.25x8 Promus Premier DES), RCA nl.   . Cough    CHRONIC AT NIGHT  . [place]ushing's disease ([place]  . Depression   .  Diverticulitis   . Edema    FEET/LEGS  . GERD (gastroesophageal reflux disease)   . Gout   . History of hiatal hernia   . History of pneumonia   . HLD (hyperlipidemia)   . HOH (hard of hearing)   . Hypertension   . Hypertensive heart disease   . Lung cancer ([place]) dx'd 2014   S/P radiation 2015  . Migraine   . Multiple allergies   . PAF (paroxysmal atrial fibrillation) ([facility])    a. on amio, Toprol XL, Cardizem CD, and Eliquis 2.5 mg bid (age & SCr); b.  CHADS2VASc ==> 7 (CHF, HTN, age x 2, DM, vascular disease and sex category)  . Sleep apnea   . Type II diabetes mellitus ([place])      Most Recent Cardiac Studies: As above   Surgical History:  Past Surgical History:  Procedure Laterality Date  . ABDOMINAL HYSTERECTOMY    . ADRENALECTOMY Left 1980's   "[place]"  . APPENDECTOMY    . BREAST CYST EXCISION Left   . CATARACT EXTRACTION W/PHACO Right 12/28/2015   Procedure: CATARACT EXTRACTION PHACO AND INTRAOCULAR LENS PLACEMENT (IOC);  Surgeon: Birder Robson, MD;  Location: [facility] ORS;  Service: Ophthalmology;  Laterality: Right;  [place] 48.4   . CHOLECYSTECTOMY    . CORONARY ANGIOPLASTY WITH STENT PLACEMENT  12/23/2014  . ELECTROPHYSIOLOGIC STUDY N/A 03/08/2016   Procedure: CARDIOVERSION;  Surgeon: Wende Bushy, MD;  Location: [facility] ORS;  Service: Cardiovascular;  Laterality: N/A;  . ELECTROPHYSIOLOGIC STUDY N/A 03/07/2016   Procedure: Cardioversion;  Surgeon: Wende Bushy, MD;  Location: [facility] ORS;  Service: Cardiovascular;  Laterality: N/A;  . FRACTURE SURGERY    . LEFT HEART CATHETERIZATION WITH CORONARY ANGIOGRAM N/A 12/23/2014   Procedure: LEFT HEART CATHETERIZATION WITH CORONARY ANGIOGRAM;  Surgeon: Burnell Blanks, MD;  Location: [facility] CATH LAB;  Service: Cardiovascular;  Laterality: N/A;  . PERCUTANEOUS CORONARY STENT INTERVENTION (PCI-S)  12/23/2014   Procedure: PERCUTANEOUS CORONARY STENT INTERVENTION (PCI-S);  Surgeon: Burnell Blanks, MD;  Location: [facility] CATH LAB;  Service: Cardiovascular;;  Promus 2.25x8  . TONSILLECTOMY    . TRANSTHORACIC ECHOCARDIOGRAM  11/26/2015   Technically difficult study. EF 55-60%. Normal wall motion. GR 1 DD.  . TUBAL LIGATION    . WRIST FRACTURE SURGERY Bilateral ~ 2000     Home Meds: Prior to Admission medications   Medication Sig Start Date End Date Taking? Authorizing Provider  ALPRAZolam (XANAX) 0.25 MG tablet Take 1 tablet (0.25 mg total) by mouth at bedtime as needed for anxiety. 03/30/16    Gladstone Lighter, MD  amiodarone (PACERONE) 400 MG tablet Take 1 tablet (400 mg total) by mouth 2 (two) times daily. 03/03/16   Minna Merritts, MD  apixaban (ELIQUIS) 2.5 MG TABS tablet Take 1 tablet (2.5 mg total) by mouth every 12 (twelve) hours. 03/30/16   Gladstone Lighter, MD  atorvastatin (LIPITOR) 20 MG tablet Take 1 tablet (20 mg total) by mouth every evening. 12/03/15   Minna Merritts, MD  cholecalciferol (VITAMIN D) 400 UNITS TABS tablet Take 400 Units by mouth every evening.     Historical Provider, MD  cyanocobalamin 500 MCG tablet Take 500 mcg by mouth every evening.    Historical Provider, MD  diltiazem (CARDIZEM CD) 240 MG 24 hr capsule Take 1 capsule (240 mg total) by mouth every evening. 02/16/16   Rise Mu, PA-C  docusate sodium (COLACE) 100 MG capsule Take 1 capsule (100 mg total) by mouth 2 (two) times daily. 03/30/16  Gladstone Lighter, MD  febuxostat (ULORIC) 40 MG tablet Take 40 mg by mouth every evening.     Historical Provider, MD  fenofibrate (TRICOR) 48 MG tablet Take 48 mg by mouth every evening.     Historical Provider, MD  ferrous sulfate 325 (65 FE) MG EC tablet Take 325 mg by mouth every evening.     Historical Provider, MD  fluticasone furoate-vilanterol (BREO ELLIPTA) 100-25 MCG/INH AEPB Inhale 1 puff into the lungs daily.    Historical Provider, MD  furosemide (LASIX) 10 MG/ML injection Inject 4 mLs (40 mg total) into the vein every 12 (twelve) hours. 03/30/16   Gladstone Lighter, MD  furosemide (LASIX) 20 MG tablet Take 20 mg by mouth daily.  03/03/16   Minna Merritts, MD  guaiFENesin (MUCINEX) 600 MG 12 hr tablet Take 1 tablet (600 mg total) by mouth 2 (two) times daily. 03/30/16   Gladstone Lighter, MD  guaiFENesin (ROBITUSSIN) 100 MG/5ML SOLN Take 10 mLs (200 mg total) by mouth every 4 (four) hours as needed for cough or to loosen phlegm. 03/24/16   Gladstone Lighter, MD  insulin aspart (NOVOLOG) 100 UNIT/ML injection Inject 0-15 Units into the skin 3 (three) times  daily with meals. Sliding scale Insulin 03/30/16   Gladstone Lighter, MD  insulin aspart (NOVOLOG) 100 UNIT/ML injection Inject 0-5 Units into the skin at bedtime. SLiding scale insulin 03/30/16   Gladstone Lighter, MD  ipratropium (ATROVENT) 0.02 % nebulizer solution Take 2.5 mLs (0.5 mg total) by nebulization every 4 (four) hours as needed for wheezing or shortness of breath. 03/24/16   Gladstone Lighter, MD  metoprolol succinate (TOPROL XL) 25 MG 24 hr tablet Take 50 mg by mouth 2 (two) times daily.  03/03/16   Minna Merritts, MD  nitroGLYCERIN (NITROSTAT) 0.4 MG SL tablet Place 1 tablet (0.4 mg total) under the tongue every 5 (five) minutes as needed for chest pain. 12/24/14   Rhonda G Barrett, PA-C  pantoprazole (PROTONIX) 40 MG tablet Take 40 mg by mouth every evening.     Historical Provider, MD  potassium chloride SA (K-DUR,KLOR-CON) 20 MEQ tablet Take 1 tablet (20 mEq total) by mouth daily. While taking lasix only 03/24/16   Gladstone Lighter, MD  Rivaroxaban (XARELTO) 15 MG TABS tablet Take 1 tablet (15 mg total) by mouth daily with supper. 02/16/16   Rise Mu, PA-C  sertraline (ZOLOFT) 50 MG tablet Take 1 tablet (50 mg total) by mouth daily. 03/24/16   Gladstone Lighter, MD  tiotropium (SPIRIVA) 18 MCG inhalation capsule Place 18 mcg into inhaler and inhale daily.    Historical Provider, MD    Allergies:  Allergies  Allergen Reactions  . Ciprofloxacin Shortness Of Breath, Itching and Rash  . Doxycycline Shortness Of Breath, Itching and Rash  . Penicillins Shortness Of Breath, Itching, Rash and Other (See Comments)    Has patient had a PCN reaction causing immediate rash, facial/tongue/throat swelling, SOB or lightheadedness with hypotension: Yes Has patient had a PCN reaction causing severe rash involving mucus membranes or skin necrosis: No Has patient had a PCN reaction that required hospitalization No Has patient had a PCN reaction occurring within the last 10 years: No If all of the  above answers are "NO", then may proceed with Cephalosporin use.  . Sulfa Antibiotics Shortness Of Breath, Itching and Rash  . Morphine And Related Itching  . Albuterol Sulfate   . Cefuroxime Rash    blisters    Social History   Social History  .  Marital status: Widowed    Spouse name: N/A  . Number of children: N/A  . Years of education: N/A   Occupational History  . retired    Social History Main Topics  . Smoking status: Former Smoker    Packs/day: 1.00    Years: 45.00    Types: Cigarettes    Quit date: 04/25/1998  . Smokeless tobacco: Never Used  . Alcohol use No     Comment: 12/23/2014 "might have a couple mixed drinks/year"  . Drug use: No  . Sexual activity: No   Other Topics Concern  . Not on file   Social History Narrative   Lives locally with son.  Does not routinely exercise.     Family History  Problem Relation Age of Onset  . Heart disease Mother   . Diabetes Mother   . Osteoarthritis Mother   . Hypertension Mother   . Heart disease Father   . Hypertension Father   . COPD Brother     Review of Systems: Review of Systems  Constitutional: Positive for malaise/fatigue. Negative for chills, diaphoresis, fever and weight loss.  HENT: Negative for congestion.   Eyes: Negative for discharge and redness.  Respiratory: Positive for cough, shortness of breath and wheezing. Negative for sputum production.   Cardiovascular: Positive for palpitations and leg swelling. Negative for chest pain, orthopnea, claudication and PND.  Gastrointestinal: Negative for abdominal pain, heartburn, nausea and vomiting.  Musculoskeletal: Negative for falls and myalgias.  Skin: Negative for rash.  Neurological: Positive for weakness. Negative for dizziness, tingling, tremors, sensory change, speech change, focal weakness and loss of consciousness.  Endo/Heme/Allergies: Does not bruise/bleed easily.  Psychiatric/Behavioral: Negative for substance abuse. The patient is not  nervous/anxious.   All other systems reviewed and are negative.   Labs:   Lab Results  Component Value Date   WBC 4.4 03/28/2016   HGB 8.5 (L) 03/28/2016   HCT 25.4 (L) 03/28/2016   MCV 81.7 03/28/2016   PLT 149 (L) 03/28/2016    Recent Labs Lab 03/30/16 0530  NA 139  K 4.1  CL 97*  CO2 35*  BUN 85*  CREATININE 2.91*  CALCIUM 8.2*  GLUCOSE 107*   No results for input(s): CKTOTAL, CKMB, TROPONINI in the last 72 hours. Lab Results  Component Value Date   CHOL 202 (H) 12/23/2014   HDL 28 (L) 12/23/2014   LDLCALC 107 (H) 12/23/2014   TRIG 337 (H) 12/23/2014   No results found for: DDIMER  Radiology/Studies:  Dg Chest 1 View  Result Date: 03/04/2016 CLINICAL DATA:  Respiratory distress EXAM: CHEST 1 VIEW COMPARISON:  Chest radiograph dated 01/24/2016. CT chest dated 01/21/2016. FINDINGS: Patchy bilateral lower lobe opacities, suspicious for pneumonia, less likely atelectasis. Superimposed mild interstitial edema is suspected. Small bilateral pleural effusions. No pneumothorax. Again noted is a stable focal opacity in the medial right upper lobe, most of which likely reflects radiation changes when correlating with prior CT, underlying residual/recurrent tumor not excluded. Cardiomegaly. IMPRESSION: Patchy bilateral lower lobe opacities, suspicious for pneumonia, less likely atelectasis. Superimposed mild interstitial edema is suspected. Small bilateral pleural effusions. Stable focal opacity in the medial right upper lobe, most of which likely reflects radiation changes when correlating with prior CT, underlying residual/recurrent tumor not excluded. Electronically Signed   By: Julian Hy M.D.   On: 03/04/2016 21:22   Dg Chest 2 View  Result Date: 03/27/2016 CLINICAL DATA:  Pulmonary edema EXAM: CHEST  2 VIEW COMPARISON:  03/24/2010 FINDINGS: Cardiac shadow  is stable. Aortic calcifications are again seen. Bilateral pleural effusions are again noted. Increasing bibasilar  infiltrates are seen when compare with the prior exam. Left jugular central line is again noted and stable. IMPRESSION: Stable pleural effusions with increasing bibasilar infiltrates. Electronically Signed   By: Inez Catalina M.D.   On: 03/27/2016 11:16  Dg Abd 1 View  Result Date: 03/16/2016 CLINICAL DATA:  Evaluate OG tube EXAM: ABDOMEN - 1 VIEW COMPARISON:  None. FINDINGS: The side port of the OG tube is near the GE junction. The distal tip is in the stomach. See the chest x-ray report from the same time for description of the lung bases. No other acute abnormalities. IMPRESSION: The side port of the OG tube is near the GE junction. Consider advancement of the tube further into the stomach. Electronically Signed   By: Dorise Bullion III M.D   On: 03/16/2016 18:28   Ct Chest Wo Contrast  Result Date: 03/07/2016 CLINICAL DATA:  History of COPD with respiratory distress for the past 3 days EXAM: CT CHEST WITHOUT CONTRAST TECHNIQUE: Multidetector CT imaging of the chest was performed following the standard protocol without IV contrast. COMPARISON:  03/04/2016, 01/21/2016 FINDINGS: Cardiovascular: Calcifications of the thoracic aorta are noted without aneurysmal dilatation. The cardiac structures show coronary calcifications. Mediastinum/Nodes: The thoracic inlet demonstrates some nodularity within the right lobe of the thyroid. This is stable from the prior exam. No significant hilar or mediastinal adenopathy is noted. Lungs/Pleura: Bilateral pleural effusions are seen left slightly greater than right. Some associated lower lobe consolidation is noted again left greater than right. These changes are relatively stable from the prior exam. The ground-glass density seen in the left upper lobe is again identified and has improved somewhat in the interval from the prior exam. Scarring in the right upper lobe is again noted stable from the prior exam as well. No new focal infiltrate is seen. Upper Abdomen: Stable  cystic lesions are again seen in the spleen. Right adrenal lesion is again noted and stable. Musculoskeletal: No acute bony abnormality noted. IMPRESSION: Bilateral pleural effusions and associated lower lobe consolidation stable from the prior study. Stable changes in the right upper lobe similar to that seen on the recent exam. Slight improvement in the ground-glass density within the left upper lobe. Electronically Signed   By: Inez Catalina M.D.   On: 03/07/2016 16:36   Dg Chest Port 1 View  Result Date: 03/24/2016 CLINICAL DATA:  Dyspnea.  History of asthma, COPD, lung cancer. EXAM: PORTABLE CHEST 1 VIEW COMPARISON:  03/20/2016 FINDINGS: Left internal jugular approach central venous catheter unchanged. Cardiomediastinal silhouette is mildly enlarged. Mediastinal contours appear intact. Calcific atherosclerotic disease of the aorta. There is no evidence of pneumothorax. There are persistent bilateral pleural effusions, slightly enlarged from the prior radiograph. Postsurgical/posttreatment changes in the right upper lobe are stable. Stable prominence of the right hilum. Osseous structures are without acute abnormality. Soft tissues are grossly normal. IMPRESSION: Slightly enlarged bilateral pleural effusions with bilateral lower lobe airspace consolidation versus atelectasis. Stable posttreatment changes in the right upper lobe. Stable prominence of the right hilum. Electronically Signed   By: Fidela Salisbury M.D.   On: 03/24/2016 11:27  Dg Chest Port 1 View  Result Date: 03/20/2016 CLINICAL DATA:  Respiratory failure. EXAM: PORTABLE CHEST 1 VIEW COMPARISON:  03/19/2016.  01/23/2016. FINDINGS: Left IJ line in stable position. Cardiomegaly with progressive bilateral pulmonary interstitial prominence and bilateral pleural effusions consistent with progressive congestive heart failure. Persistent density in  the right upper lobe without significant interim change and most likely related to radiation  change. Surgical clips right upper chest. IMPRESSION: 1.  Left IJ line stable position. 2. Congestive heart failure with bilateral pulmonary interstitial edema and pleural effusions. 3. Persistent density in the right upper lobe most consistent with scarring and/or radiation change. Electronically Signed   By: Marcello Moores  Register   On: 03/20/2016 07:11  Dg Chest Port 1 View  Result Date: 03/19/2016 CLINICAL DATA:  Respiratory failure EXAM: PORTABLE CHEST 1 VIEW COMPARISON:  03/18/2016 FINDINGS: Cardiomegaly. No frank interstitial edema. Moderate left and small right pleural effusions. Associated bilateral lower lobe opacities, likely atelectasis. Stable platelike opacity in the right upper lobe, presumably radiation changes. Left IJ venous catheter terminates in the lower SVC. IMPRESSION: Moderate left and small right pleural effusions. No frank interstitial edema. Electronically Signed   By: Julian Hy M.D.   On: 03/19/2016 11:44  Dg Chest Port 1 View  Result Date: 03/18/2016 CLINICAL DATA:  Respiratory failure. EXAM: PORTABLE CHEST 1 VIEW COMPARISON:  03/17/2016 and CT chest 03/07/2016, 01/21/2016. FINDINGS: Interval extubation. Nasogastric tube is been removed as well. Left IJ central line tip projects over the SVC. Basilar dependent airspace opacification with left lower lobe collapse/ consolidation. Small right pleural effusion and moderate left pleural effusion. Oblong opacity in the apex of the right hemi thorax is seen with fiducial markers, stable. IMPRESSION: 1. Basilar dependent airspace opacification with bilateral pleural effusions. Findings may be due to congestive heart failure. 2. Left lower lobe collapse/consolidation. 3. Oblong opacity with fiducial markers in the right upper lobe, better evaluated on cross-sectional imaging 03/07/2016 and 01/21/2016. Electronically Signed   By: Lorin Picket M.D.   On: 03/18/2016 07:59   Dg Chest Port 1 View  Result Date: 03/17/2016 CLINICAL  DATA:  Acute respiratory failure. EXAM: PORTABLE CHEST 1 VIEW COMPARISON:  Chest radiograph March 16, 2016 FINDINGS: Chest radiograph is enlarged, similar to prior imaging. Calcified aortic knob. Similar pulmonary vascular congestion with increasing RIGHT, similar LEFT small to moderate pleural effusions. Interstitial prominence. Bibasilar consolidation. Similar fullness of the hilum. No pneumothorax. Endotracheal tube tip projects 2.5 cm above the carina. Nasogastric tube past proximal stomach, distal tip not imaged. LEFT internal jugular central venous catheter distal tip projects in mid superior vena cava. Soft tissue planes and included osseous structures are nonsuspicious. Surgical clips project in LEFT upper abdomen. IMPRESSION: Stable cardiomegaly and pulmonary edema with increasing RIGHT, similar LEFT small to moderate pleural effusions. Underlying consolidation. No apparent change in life-support lines. Electronically Signed   By: Elon Alas M.D.   On: 03/17/2016 04:38   Dg Chest Port 1 View  Result Date: 03/16/2016 CLINICAL DATA:  Line placement EXAM: PORTABLE CHEST 1 VIEW COMPARISON:  March 16, 2016 FINDINGS: The ETT is in good position. A new left central line terminates in mid SVC. No pneumothorax is identified. Opacity in the medial right upper lung is similar in the interval. The left-sided pleural effusion with underlying opacity is stable. The effusion and opacity in the right base has improved. No change in cardiomegaly. No other interval changes. IMPRESSION: A new left IJ is identified in good position with no pneumothorax. The left-sided effusion and opacity is stable while the right-sided effusion and opacity has improved. Electronically Signed   By: Dorise Bullion III M.D   On: 03/16/2016 18:28   Dg Chest Port 1 View  Result Date: 03/16/2016 CLINICAL DATA:  Status post intubation and NG tube placement. Shortness of  breath. EXAM: PORTABLE CHEST 1 VIEW COMPARISON:  Single-view of  the chest earlier today. FINDINGS: NG tube is seen with the side port at the gastroesophageal junction and tip just within the stomach. The tube should be advanced approximately 5 cm for better positioning. New endotracheal tube is in place with the tip in good position at the level of the clavicular heads. Bilateral effusions and airspace disease persist without marked change since the study earlier today. IMPRESSION: ET tube in good position. NG tube should be advanced approximately 5 cm for better positioning. Bilateral pleural effusions and basilar airspace disease. Electronically Signed   By: Inge Rise M.D.   On: 03/16/2016 15:52   Dg Chest Port 1 View  Result Date: 03/16/2016 CLINICAL DATA:  Difficulty breathing today. EXAM: PORTABLE CHEST 1 VIEW COMPARISON:  Single-view of the chest 03/10/2016 and 03/04/2016. CT chest 03/07/2016. FINDINGS: Moderate bilateral pleural effusions have increased in size since the most recent examination. Associated basilar atelectasis is noted. There is cardiomegaly and mild interstitial edema. IMPRESSION: Increased moderate bilateral pleural effusions and basilar atelectasis. Cardiomegaly and mild interstitial edema. Electronically Signed   By: Inge Rise M.D.   On: 03/16/2016 14:38   Dg Chest Port 1 View  Result Date: 03/10/2016 CLINICAL DATA:  Acute respiratory failure, asthma -COPD, CHF, coronary artery disease EXAM: PORTABLE CHEST 1 VIEW COMPARISON:  Portable chest x-ray of March 04, 2016 and chest CT scan of March 07, 2016 FINDINGS: The lungs are well-expanded. There remain bilateral pleural effusions which have increased slightly in volume. The pulmonary vascularity is less engorged. The interstitial markings are less prominent. The cardiac silhouette remains enlarged. There is calcification in the wall of the aortic arch. IMPRESSION: Mild interval improvement in pulmonary interstitial edema and bilateral pleural effusions. Persistent left basilar  atelectasis or pneumonia. Electronically Signed   By: David  Martinique M.D.   On: 03/10/2016 07:51     EKG: Interpreted by me showed: Afib with RVR, 120 bpm, LBBB Telemetry: Interpreted by me showed: Afib with heart rates in the 90's to low 100's bpm  Weights: There were no vitals filed for this visit.   Physical Exam: BP: 97/56, P: 97, R: 18, T: 98.2, O2: 99% room air, Weight 191 lb 4.8 oz  General: Well developed, well nourished, in no acute distress. Head: Normocephalic, atraumatic, sclera non-icteric, no xanthomas, nares are without discharge.  Neck: Negative for carotid bruits. JVD elevated. Lungs: Clear bilaterally to auscultation without wheezes, rales, or rhonchi. Breathing is unlabored. Heart: Irregularly irregular with S1 S2. No murmurs, rubs, or gallops appreciated. Abdomen: Soft, non-tender, non-distended with normoactive bowel sounds. No hepatomegaly. No rebound/guarding. No obvious abdominal masses. Msk:  Strength and tone appear normal for age. Extremities: No clubbing or cyanosis. 1-2+ bilateral LE edema. Distal pedal pulses are 2+ and equal bilaterally. Neuro: Alert and oriented X 3. No focal deficit. No facial asymmetry. Moves all extremities spontaneously. Psych:  Responds to questions appropriately with a normal affect.    ASSESSMENT AND PLAN:  Principal Problem:   Acute on chronic combined systolic and diastolic CHF, NYHA class 3 (HCC) Active Problems:   Acute on chronic respiratory failure with hypoxia and hypercapnia (HCC) - secondary to COPD exacerbation, now with A. fib RVR   Persistent atrial fibrillation (HCC)   Coronary artery disease involving native coronary artery of native heart without angina pectoris   Acute on chronic renal failure (HCC)   Chronic obstructive pulmonary disease with acute exacerbation (HCC)   Essential hypertension  1. Acute on chronic combined systolic and diastolic CHF:  In setting of persistent AFib and resp failure.  Echo 7/22  with EF of 35-40%, diff HK.  She has been diuresed, now on boluses of lasix with good output - minus 1.78L overnight and 2.4 L for admission.  Wt down to 189 lbs on 8/1 (was 194 on 7/30; 191 on 7/31).  Renal fxn stable and nephrology following. Cont lasix 40 IV bid.  2.  Permaent AFib:  Contributing to #1.  S/p prior DCCV.  Remains on amio 400 bid for rate control.  Anticoagulated with dose adjusted eliquis.  Seen by EP yesterday with plan for tx to [facility] on 8/3 for PPM and RFCA on 8/5.  She has been unable to lie flat 2/2 orthopnea.  Cont diuresis.  Seen by CCM on 8/1 re: resp risks related to intubation for PPM.  She is felt to be @ moderate risk for pulm complications and pulm has recommended bipap before and after the procedure to minimize risks.  Eliquis Oluwatoyin Banales need to be held on the evening of 8/3. Shaquna Geigle stop amio post procedure  3.  CAD:  No symptoms concerning for angina.  Cont ? blocker.  4.  Acute on chronic resp failure w/ hypoxia and hypercapnea:  Multifactorial in setting of above along with COPD.  Using bipap prn.  Cont inhalers and diuresis. Sandhya Denherder add Incentive spirometer and acapella   5.  Acute anemia:  Stable.  outpt heme f/u.  6.  Acute on chronic stage IV kidney dzs:  Stable on diuresis.  Nephrology following.  Signed, Christell Faith, PA-C [facility] Pager: [phone] 03/30/2016, 9:39 AM  I have seen and examined this patient with Christell Faith.  Agree with above, note added to reflect my findings.  On exam, irregular, tachycardic, lungs clear, no murmurs.  Presented to the hospital in permanent atrial fibrillation and with acute on chronic respiratory failure.  Transferred to [facility] for CRT and AVJ ablation, planned for tomorrow.  Giavana Rooke plan to have anesthesia help with airway management for the procedure.  Jossie Smoot continue diuresis as well.      Lekisha Mcghee M. Ector Laurel MD 03/30/2016 2:35 PM

## 2016-03-30 NOTE — Progress Notes (Signed)
Patient discharge via stretcher and carelink. Report given to carelink and nurse accepting patient at 3 west. Triple lumen IJ intact and flushed saline locked. Foley catheter intact and patent with no dependent loops and below the bladder. Stable at time of transport. Belongings sent with patients son. All paper work given to Eldred.

## 2016-03-30 NOTE — Progress Notes (Signed)
[facility]  Patient Status: Active (receiving services up to time of hospitalization)  [facility] is providing the following services: RN  If patient discharges after hours, please call [phone].   Regina Burke 03/30/2016, 1:10 PM

## 2016-03-30 NOTE — Progress Notes (Signed)
Patient Name: Regina Burke Date of Encounter: 03/30/2016  Hospital Problem List     Principal Problem:   Acute on chronic combined systolic and diastolic CHF, NYHA class 3 (HCC) Active Problems:   Acute on chronic respiratory failure with hypoxia and hypercapnia (HCC) - secondary to COPD exacerbation, now with A. fib RVR   Coronary artery disease involving native coronary artery of native heart without angina pectoris   Centrilobular emphysema (HCC)   Chronic obstructive pulmonary disease with acute exacerbation (HCC)   Persistent atrial fibrillation ([place]): CHA2DS2-Vasc = ~7. On Xarelto 15 mg (age & renal Fxn)   Congestive dilated cardiomyopathy (HCC)   Respiratory failure (HCC)   Acute respiratory failure (HCC)   Systolic and diastolic CHF, acute on chronic (HCC)    Subjective   Good output yesterday and she felt better throughout the day yesterday but was still orthopneic last night and wore bipap overnight.  No dyspnea sitting up this AM.  Eager to go to [facility] tomorrow for ablation and pacer.  Inpatient Medications    . amiodarone  400 mg Oral BID  . apixaban  2.5 mg Oral Q12H  . docusate sodium  100 mg Oral BID  . ferrous sulfate  325 mg Oral Q breakfast  . fluticasone furoate-vilanterol  1 puff Inhalation Daily  . furosemide  40 mg Intravenous Q12H  . guaiFENesin  600 mg Oral BID  . insulin aspart  0-15 Units Subcutaneous TID WC  . insulin aspart  0-5 Units Subcutaneous QHS  . metoprolol tartrate  50 mg Oral BID  . pantoprazole  40 mg Oral Q1200  . sertraline  50 mg Oral Daily  . sodium chloride flush  10-40 mL Intracatheter Q12H  . tiotropium  18 mcg Inhalation Daily    Vital Signs    Vitals:   03/29/16 1110 03/29/16 1951 03/29/16 2205 03/30/16 0426  BP: 95/81 (!) 101/55  (!) 97/56  Pulse: (!) 110 (!) 109  97  Resp: '18 18  18  '$ Temp: 98 F (36.7 C) 98.4 F (36.9 C)  98.2 F (36.8 C)  TempSrc: Oral Oral  Oral  SpO2: 100% 97% 95% 99%  Weight:    191 lb 4.8  oz (86.8 kg)  Height:        Intake/Output Summary (Last 24 hours) at 03/30/16 0835 Last data filed at 03/30/16 0714  Gross per 24 hour  Intake              750 ml  Output             2100 ml  Net            -1350 ml   Filed Weights   03/28/16 0431 03/29/16 0410 03/30/16 0426  Weight: 191 lb 4.8 oz (86.8 kg) 189 lb 3.2 oz (85.8 kg) 191 lb 4.8 oz (86.8 kg)    Physical Exam    General: Pleasant, NAD. Neuro: Alert and oriented X 3. Moves all extremities spontaneously. Psych: Normal affect. HEENT:  Normal  Neck: Supple without bruits.  JVP ~10  Lungs:  Resp regular and unlabored, diminished breath sounds @ bases with bibasilar crackles.   Decreased BS left chest  Heart: IR, IR, distant. Abdomen: protuberant, BS + x 4.  Extremities: No clubbing, cyanosis.  1-2+ bilat LE edema. DP/PT/Radials 1+ and equal bilaterally.  Labs    CBC  Recent Labs  03/28/16 0517  WBC 4.4  HGB 8.5*  HCT 25.4*  MCV 81.7  PLT  725*   Basic Metabolic Panel  Recent Labs  03/28/16 0500  03/29/16 0500 03/30/16 0530  NA 140  < > 140 139  K 4.4  < > 4.0 4.1  CL 99*  < > 98* 97*  CO2 33*  < > 33* 35*  GLUCOSE 144*  < > 156* 107*  BUN 81*  < > 84* 85*  CREATININE 3.60*  < > 3.05* 2.91*  CALCIUM 8.5*  < > 8.4* 8.2*  PHOS 4.5  --  3.8  3.9  --   < > = values in this interval not displayed. Liver Function Tests  Recent Labs  03/28/16 0500 03/29/16 0500  ALBUMIN 3.4* 3.3*  3.3*    Telemetry   Afib 90's  Radiology    No new studies  2D Echocardiogram 7.28.2017  Study Conclusions  - Left ventricle: The cavity size was normal. There was mild   concentric hypertrophy. Rhythm still atrial fibrillation with VR   in 100-110s. Study used definity contrast for LV opacifcation.   Systolic function appears moderately reduced. The estimated   ejection fraction was in the range of 35% to 40%. Abnormal septal   motion due to LBBB appears to contribute to this significantly. -  Ventricular septum: Septal motion showed abnormal function and   paradox. These changes are consistent with a left bundle branch   block. - Left atrium: The atrium was severely dilated. - Right atrium: The atrium was moderately dilated. - Tricuspid valve: There was moderate-severe regurgitation. - Pulmonary arteries: Systolic pressure was moderately increased. - Pericardium, extracardiac: There was a left pleural effusion.  Assessment & Plan    1.  Acute on chronic combined systolic and diastolic CHF:  In setting of persistent AFib and resp failure.  Echo 7/22 with EF of 35-40%, diff HK.  She has been diuresed, now on boluses of lasix with good output - minus 1.78L overnight and 2.4 L for admission.  Wt down to 189 lbs on 8/1 (was 194 on 7/30; 191 on 7/31).  Renal fxn stable and nephrology following. Cont lasix 40 IV bid.  2.  Permaent AFib:  Contributing to #1.  S/p prior DCCV.  Remains on amio 400 bid for rate control.  Anticoagulated with dose adjusted eliquis.  Seen by EP yesterday with plan for tx to [facility] on 8/3 for PPM and RFCA on 8/5.  She has been unable to lie flat 2/2 orthopnea.  Cont diuresis.  Seen by CCM on 8/1 re: resp risks related to intubation for PPM.  She is felt to be @ moderate risk for pulm complications and pulm has recommended bipap before and after the procedure to minimize risks.  Eliquis will need to be held on the evening of 8/3. Will stop amio post procedure  3.  CAD:  No symptoms concerning for angina.  Cont  blocker.  4.  Acute on chronic resp failure w/ hypoxia and hypercapnea:  Multifactorial in setting of above along with COPD.  Using bipap prn.  Cont inhalers and diuresis. Will add Incentive spirometer and acapella   5.  Acute anemia:  Stable.  outpt heme f/u.  6.  Acute on chronic stage IV kidney dzs:  Stable on diuresis.  Nephrology following.  Signed, Virl Axe

## 2016-03-31 ENCOUNTER — Encounter (HOSPITAL_COMMUNITY): Payer: Self-pay | Admitting: Cardiology

## 2016-03-31 ENCOUNTER — Encounter (HOSPITAL_COMMUNITY)
Admission: AD | Disposition: A | Discharge status: Skilled Nursing Facility | Payer: Self-pay | Source: Other Acute Inpatient Hospital | Attending: Cardiology

## 2016-03-31 ENCOUNTER — Ambulatory Visit (HOSPITAL_COMMUNITY): Admission: RE | Admit: 2016-03-31 | Payer: Medicare Other | Source: Ambulatory Visit | Admitting: Cardiology

## 2016-03-31 ENCOUNTER — Observation Stay (HOSPITAL_COMMUNITY): Payer: Medicare Other | Admitting: Anesthesiology

## 2016-03-31 DIAGNOSIS — N179 Acute kidney failure, unspecified: Secondary | ICD-10-CM | POA: Diagnosis not present

## 2016-03-31 DIAGNOSIS — Z87891 Personal history of nicotine dependence: Secondary | ICD-10-CM | POA: Diagnosis not present

## 2016-03-31 DIAGNOSIS — K219 Gastro-esophageal reflux disease without esophagitis: Secondary | ICD-10-CM | POA: Diagnosis not present

## 2016-03-31 DIAGNOSIS — I5022 Chronic systolic (congestive) heart failure: Secondary | ICD-10-CM | POA: Diagnosis not present

## 2016-03-31 DIAGNOSIS — J9622 Acute and chronic respiratory failure with hypercapnia: Secondary | ICD-10-CM | POA: Diagnosis not present

## 2016-03-31 DIAGNOSIS — R0602 Shortness of breath: Secondary | ICD-10-CM | POA: Diagnosis present

## 2016-03-31 DIAGNOSIS — J9621 Acute and chronic respiratory failure with hypoxia: Secondary | ICD-10-CM | POA: Diagnosis not present

## 2016-03-31 DIAGNOSIS — I252 Old myocardial infarction: Secondary | ICD-10-CM | POA: Diagnosis not present

## 2016-03-31 DIAGNOSIS — I480 Paroxysmal atrial fibrillation: Secondary | ICD-10-CM | POA: Diagnosis not present

## 2016-03-31 DIAGNOSIS — G4733 Obstructive sleep apnea (adult) (pediatric): Secondary | ICD-10-CM | POA: Diagnosis not present

## 2016-03-31 DIAGNOSIS — E876 Hypokalemia: Secondary | ICD-10-CM | POA: Diagnosis not present

## 2016-03-31 DIAGNOSIS — I482 Chronic atrial fibrillation: Secondary | ICD-10-CM | POA: Diagnosis not present

## 2016-03-31 DIAGNOSIS — M109 Gout, unspecified: Secondary | ICD-10-CM | POA: Diagnosis not present

## 2016-03-31 DIAGNOSIS — I429 Cardiomyopathy, unspecified: Secondary | ICD-10-CM | POA: Diagnosis not present

## 2016-03-31 DIAGNOSIS — J441 Chronic obstructive pulmonary disease with (acute) exacerbation: Secondary | ICD-10-CM | POA: Diagnosis not present

## 2016-03-31 DIAGNOSIS — N183 Chronic kidney disease, stage 3 (moderate): Secondary | ICD-10-CM | POA: Diagnosis not present

## 2016-03-31 DIAGNOSIS — E785 Hyperlipidemia, unspecified: Secondary | ICD-10-CM | POA: Diagnosis not present

## 2016-03-31 DIAGNOSIS — I447 Left bundle-branch block, unspecified: Secondary | ICD-10-CM | POA: Diagnosis not present

## 2016-03-31 DIAGNOSIS — I2510 Atherosclerotic heart disease of native coronary artery without angina pectoris: Secondary | ICD-10-CM | POA: Diagnosis not present

## 2016-03-31 DIAGNOSIS — Z7901 Long term (current) use of anticoagulants: Secondary | ICD-10-CM | POA: Diagnosis not present

## 2016-03-31 DIAGNOSIS — D649 Anemia, unspecified: Secondary | ICD-10-CM | POA: Diagnosis not present

## 2016-03-31 DIAGNOSIS — I130 Hypertensive heart and chronic kidney disease with heart failure and stage 1 through stage 4 chronic kidney disease, or unspecified chronic kidney disease: Secondary | ICD-10-CM | POA: Diagnosis not present

## 2016-03-31 DIAGNOSIS — F329 Major depressive disorder, single episode, unspecified: Secondary | ICD-10-CM | POA: Diagnosis not present

## 2016-03-31 DIAGNOSIS — E1122 Type 2 diabetes mellitus with diabetic chronic kidney disease: Secondary | ICD-10-CM | POA: Diagnosis not present

## 2016-03-31 DIAGNOSIS — I5043 Acute on chronic combined systolic (congestive) and diastolic (congestive) heart failure: Secondary | ICD-10-CM | POA: Diagnosis not present

## 2016-03-31 DIAGNOSIS — I428 Other cardiomyopathies: Secondary | ICD-10-CM | POA: Diagnosis not present

## 2016-03-31 DIAGNOSIS — I481 Persistent atrial fibrillation: Secondary | ICD-10-CM | POA: Diagnosis not present

## 2016-03-31 HISTORY — PX: EP IMPLANTABLE DEVICE: SHX172B

## 2016-03-31 HISTORY — PX: ELECTROPHYSIOLOGIC STUDY: SHX172A

## 2016-03-31 LAB — GLUCOSE, CAPILLARY
GLUCOSE-CAPILLARY: 128 mg/dL — AB (ref 65–99)
GLUCOSE-CAPILLARY: 144 mg/dL — AB (ref 65–99)
Glucose-Capillary: 115 mg/dL — ABNORMAL HIGH (ref 65–99)
Glucose-Capillary: 150 mg/dL — ABNORMAL HIGH (ref 65–99)

## 2016-03-31 LAB — PROTIME-INR
INR: 1.46
Prothrombin Time: 17.8 seconds — ABNORMAL HIGH (ref 11.4–15.2)

## 2016-03-31 LAB — CBC
HEMATOCRIT: 27.2 % — AB (ref 36.0–46.0)
Hemoglobin: 8.1 g/dL — ABNORMAL LOW (ref 12.0–15.0)
MCH: 26.5 pg (ref 26.0–34.0)
MCHC: 29.8 g/dL — ABNORMAL LOW (ref 30.0–36.0)
MCV: 88.9 fL (ref 78.0–100.0)
PLATELETS: 155 10*3/uL (ref 150–400)
RBC: 3.06 MIL/uL — AB (ref 3.87–5.11)
RDW: 19.1 % — ABNORMAL HIGH (ref 11.5–15.5)
WBC: 3.5 10*3/uL — AB (ref 4.0–10.5)

## 2016-03-31 LAB — BASIC METABOLIC PANEL
Anion gap: 9 (ref 5–15)
BUN: 78 mg/dL — ABNORMAL HIGH (ref 6–20)
CHLORIDE: 96 mmol/L — AB (ref 101–111)
CO2: 35 mmol/L — AB (ref 22–32)
CREATININE: 2.76 mg/dL — AB (ref 0.44–1.00)
Calcium: 8.6 mg/dL — ABNORMAL LOW (ref 8.9–10.3)
GFR calc non Af Amer: 15 mL/min — ABNORMAL LOW (ref >60)
GFR, EST AFRICAN AMERICAN: 18 mL/min — AB (ref >60)
GLUCOSE: 110 mg/dL — AB (ref 65–99)
Potassium: 4.1 mmol/L (ref 3.5–5.1)
Sodium: 140 mmol/L (ref 135–145)

## 2016-03-31 LAB — SURGICAL PCR SCREEN
MRSA, PCR: NEGATIVE
STAPHYLOCOCCUS AUREUS: NEGATIVE

## 2016-03-31 SURGERY — BIV PACEMAKER INSERTION CRT-P
Anesthesia: Monitor Anesthesia Care

## 2016-03-31 MED ORDER — ACETAMINOPHEN 325 MG PO TABS
650.0000 mg | ORAL_TABLET | ORAL | Status: DC | PRN
  Administered 2016-03-31 – 2016-04-02 (×2): 650 mg via ORAL
  Filled 2016-03-31: qty 2

## 2016-03-31 MED ORDER — IOPAMIDOL (ISOVUE-370) INJECTION 76%
INTRAVENOUS | Status: DC | PRN
  Administered 2016-03-31: 10 mL

## 2016-03-31 MED ORDER — SODIUM CHLORIDE 0.9% FLUSH: 3.0000 mL | INTRAVENOUS | Status: DC | PRN

## 2016-03-31 MED ORDER — BUPIVACAINE HCL (PF) 0.25 % IJ SOLN
INTRAMUSCULAR | Status: AC
  Filled 2016-03-31: qty 30

## 2016-03-31 MED ORDER — HEPARIN (PORCINE) IN NACL 2-0.9 UNIT/ML-% IJ SOLN
INTRAMUSCULAR | Status: AC
  Filled 2016-03-31: qty 500

## 2016-03-31 MED ORDER — LIDOCAINE HCL (PF) 1 % IJ SOLN
INTRAMUSCULAR | Status: AC
  Filled 2016-03-31: qty 30

## 2016-03-31 MED ORDER — HEPARIN (PORCINE) IN NACL 2-0.9 UNIT/ML-% IJ SOLN
INTRAMUSCULAR | Status: DC | PRN
  Administered 2016-03-31: 14:00:00

## 2016-03-31 MED ORDER — VANCOMYCIN HCL IN DEXTROSE 1-5 GM/200ML-% IV SOLN
1000.0000 mg | Freq: Two times a day (BID) | INTRAVENOUS | Status: AC
  Administered 2016-04-01: 1000 mg via INTRAVENOUS
  Filled 2016-03-31: qty 200

## 2016-03-31 MED ORDER — ACETAMINOPHEN 325 MG PO TABS
325.0000 mg | ORAL_TABLET | ORAL | Status: DC | PRN
  Administered 2016-04-02: 650 mg via ORAL

## 2016-03-31 MED ORDER — SODIUM CHLORIDE 0.9 % IV SOLN: 250.0000 mL | INTRAVENOUS | Status: DC | PRN

## 2016-03-31 MED ORDER — FENTANYL CITRATE (PF) 100 MCG/2ML IJ SOLN: 25.0000 ug | INTRAMUSCULAR | Status: DC | PRN

## 2016-03-31 MED ORDER — SODIUM CHLORIDE 0.9% FLUSH
3.0000 mL | Freq: Two times a day (BID) | INTRAVENOUS | Status: DC
  Administered 2016-03-31: 10 mL via INTRAVENOUS
  Administered 2016-04-01 – 2016-04-02 (×3): 3 mL via INTRAVENOUS

## 2016-03-31 MED ORDER — SODIUM CHLORIDE 0.9 % IV SOLN
INTRAVENOUS | Status: DC | PRN
  Administered 2016-03-31: 13:00:00 via INTRAVENOUS

## 2016-03-31 MED ORDER — MIDAZOLAM HCL 5 MG/5ML IJ SOLN
INTRAMUSCULAR | Status: DC | PRN
  Administered 2016-03-31: 0.5 mg via INTRAVENOUS
  Administered 2016-03-31: 1 mg via INTRAVENOUS
  Administered 2016-03-31: 0.5 mg via INTRAVENOUS

## 2016-03-31 MED ORDER — ONDANSETRON HCL 4 MG/2ML IJ SOLN: 4.0000 mg | Freq: Four times a day (QID) | INTRAMUSCULAR | Status: DC | PRN

## 2016-03-31 MED ORDER — BUPIVACAINE HCL (PF) 0.25 % IJ SOLN
INTRAMUSCULAR | Status: DC | PRN
  Administered 2016-03-31: 25 mL

## 2016-03-31 MED ORDER — IOPAMIDOL (ISOVUE-370) INJECTION 76%
INTRAVENOUS | Status: AC
  Filled 2016-03-31: qty 50

## 2016-03-31 MED ORDER — LIDOCAINE HCL (PF) 1 % IJ SOLN
INTRAMUSCULAR | Status: DC | PRN
  Administered 2016-03-31: 43 mL via INTRADERMAL

## 2016-03-31 MED ORDER — FENTANYL CITRATE (PF) 100 MCG/2ML IJ SOLN
INTRAMUSCULAR | Status: DC | PRN
  Administered 2016-03-31 (×7): 25 ug via INTRAVENOUS

## 2016-03-31 MED ORDER — KETAMINE HCL 100 MG/ML IJ SOLN
INTRAMUSCULAR | Status: DC | PRN
  Administered 2016-03-31 (×7): 10 mg via INTRAVENOUS

## 2016-03-31 MED ORDER — KETAMINE HCL 100 MG/ML IJ SOLN
INTRAMUSCULAR | Status: AC
  Filled 2016-03-31: qty 1

## 2016-03-31 SURGICAL SUPPLY — 25 items
BAG SNAP BAND KOVER 36X36 (MISCELLANEOUS) ×3 IMPLANT
BALLN ATTAIN 80 (BALLOONS) ×2
BALLN ATTAIN 80CM 6215 (BALLOONS) ×1
BALLOON ATTAIN 80 (BALLOONS) ×1 IMPLANT
BLANKET WARM UNDERBOD FULL ACC (MISCELLANEOUS) ×3 IMPLANT
CABLE SURGICAL S-101-97-12 (CABLE) ×3 IMPLANT
CATH ATTAIN COM SURV 6250V-EH (CATHETERS) ×3 IMPLANT
CATH BLAZERPRIME XP (ABLATOR) ×3 IMPLANT
CATH JOSEPHSON QUAD-ALLRED 6FR (CATHETERS) ×3 IMPLANT
DEVICE CRTP PERCEPTA QUAD MRI (Pacemaker) ×3 IMPLANT
KIT ESSENTIALS PG (KITS) ×3 IMPLANT
LEAD ATTAIN PERFOMA 4298-88CM (Lead) ×3 IMPLANT
LEAD CAPSURE NOVUS 5076-58CM (Lead) ×3 IMPLANT
PACK EP LATEX FREE (CUSTOM PROCEDURE TRAY) ×2
PACK EP LF (CUSTOM PROCEDURE TRAY) ×1 IMPLANT
PAD DEFIB LIFELINK (PAD) ×3 IMPLANT
SHEATH CLASSIC 7F (SHEATH) ×3 IMPLANT
SHEATH CLASSIC 9.5F (SHEATH) ×3 IMPLANT
SHEATH PINNACLE 6F 10CM (SHEATH) ×3 IMPLANT
SHEATH PINNACLE 8F 10CM (SHEATH) ×3 IMPLANT
SHIELD RADPAD SCOOP 12X17 (MISCELLANEOUS) ×3 IMPLANT
SLITTER 6230UNI (MISCELLANEOUS) ×3 IMPLANT
TRAY PACEMAKER INSERTION (PACKS) ×3 IMPLANT
WIRE ACUITY WHISPER EDS 4648 (WIRE) ×3 IMPLANT
WIRE HI TORQ VERSACORE-J 145CM (WIRE) ×3 IMPLANT

## 2016-03-31 NOTE — Evaluation (Signed)
Physical Therapy Evaluation Patient Details Name: Regina Burke MRN: [id number] DOB: Jan 15, 1933 Today's Date: 03/31/2016   History of Present Illness  82 y.o. female admitted to [facility] on 03/30/16 for SOB.  Pt dx with acute on chronic combined systolic and diastolic CHF in the setting of persistant A-fib with resp failure.  Pt due to have ablation and pacemaker implant on 03/31/16.  Pt with other significant PMhx of DM2, PAF, lung CA s/p radiation, HTN, HOH, gout, CAD, COPD, CKD III, and bil wrist fx surgery.  Clinical Impression  Pt has been getting weaker and more deconditioned over the past month to the point that she doesn't feel safe walking with her cane now.  She was only able to stand EOB with me, assisted with both hands supported for <1 minute.  She is weak and edematous in bil legs and is fearful of falling.  She would benefit from SNF level rehab if she qualifies at discharge.  PT to follow acutely until d/c confirmed.       Follow Up Recommendations SNF (would like one near her home in [place])    Equipment Recommendations  None recommended by PT    Recommendations for Other Services   NA    Precautions / Restrictions Precautions Precautions: Fall Precaution Comments: pt is weak and has been getting weaker for the past month      Mobility  Bed Mobility Overal bed mobility: Needs Assistance Bed Mobility: Supine to Sit     Supine to sit: Min assist Sit to supine: Mod assist   General bed mobility comments: Min assist to support trunk to get to sitting EOB, mod assist to help lift both legs back up into bed to get to supine.   Transfers Overall transfer level: Needs assistance Equipment used: 1 person hand held assist Transfers: Sit to/from Stand Sit to Stand: Min assist         General transfer comment: Min assist to stand from bed holding PT's hand on the right and bed rail on the left.  Pt needed assist to boost up over weak legs.  Legs are edematous (pt  reports they are always) and indentations can be seen from LE SCDs.   Ambulation/Gait             General Gait Details: gait assessment defered as pt did not feel strong enough to attempt today.          Balance Overall balance assessment: Needs assistance Sitting-balance support: Feet supported;Bilateral upper extremity supported Sitting balance-Leahy Scale: Fair     Standing balance support: Bilateral upper extremity supported Standing balance-Leahy Scale: Poor                               Pertinent Vitals/Pain Pain Assessment: No/denies pain    Home Living Family/patient expects to be discharged to:: Private residence Living Arrangements: Children Available Help at Discharge: Family;Available 24 hours/day (son) Type of Home: House Home Access: Stairs to enter Entrance Stairs-Rails: None Entrance Stairs-Number of Steps: 1 step to get onto porch, one step to get into house, one step inside house between rooms. Can hold onto wall for support with each step. (uses door frame and cane, small steps to enter the house) Home Layout: One level Home Equipment: Walker - 2 wheels;Cane - single point;Shower seat;Grab bars - tub/shower;Walker - 4 wheels Additional Comments: Wears 2 L O2 [place] 24/7 at home    Prior Function Level  of Independence: Needs assistance   Gait / Transfers Assistance Needed: Until about a month ago, pt was independent with cane both inside and outside the home.  Pt reports having increased difficulty walking for the past month due to increased weakness and SOB with mobility to the point that she stopped walking.            Hand Dominance   Dominant Hand: Right    Extremity/Trunk Assessment   Upper Extremity Assessment: Overall WFL for tasks assessed           Lower Extremity Assessment: Generalized weakness      Cervical / Trunk Assessment: Normal  Communication   Communication: HOH  Cognition Arousal/Alertness:  Awake/alert Behavior During Therapy: WFL for tasks assessed/performed Overall Cognitive Status: Within Functional Limits for tasks assessed                               Assessment/Plan    PT Assessment Patient needs continued PT services  PT Diagnosis Difficulty walking;Abnormality of gait;Generalized weakness   PT Problem List Decreased strength;Decreased activity tolerance;Decreased balance;Decreased mobility;Decreased knowledge of use of DME;Cardiopulmonary status limiting activity  PT Treatment Interventions DME instruction;Gait training;Stair training;Functional mobility training;Therapeutic activities;Therapeutic exercise;Balance training;Patient/family education   PT Goals (Current goals can be found in the Care Plan section) Acute Rehab PT Goals Patient Stated Goal: Pt reports she would like to go to rehab to get stronger PT Goal Formulation: With patient Time For Goal Achievement: 04/14/16 Potential to Achieve Goals: Good    Frequency Min 3X/week           End of Session Equipment Utilized During Treatment: Oxygen Activity Tolerance: Patient limited by fatigue Patient left: in bed;with call bell/phone within reach;with bed alarm set;with family/visitor present      Functional Assessment Tool Used: assist level Functional Limitation: Mobility: Walking and moving around Mobility: Walking and Moving Around Current Status (V3748): At least 20 percent but less than 40 percent impaired, limited or restricted Mobility: Walking and Moving Around Goal Status [phone]): At least 1 percent but less than 20 percent impaired, limited or restricted    Time: 1125-1141 PT Time Calculation (min) (ACUTE ONLY): 16 min   Charges:   PT Evaluation $PT Eval Moderate Complexity: 1 Procedure     PT G Codes:   PT G-Codes **NOT FOR INPATIENT CLASS** Functional Assessment Tool Used: assist level Functional Limitation: Mobility: Walking and moving around Mobility: Walking and  Moving Around Current Status (M7544): At least 20 percent but less than 40 percent impaired, limited or restricted Mobility: Walking and Moving Around Goal Status [phone]): At least 1 percent but less than 20 percent impaired, limited or restricted    Psalm Arman B. [place], [place], DPT [phone]   03/31/2016, 3:45 PM

## 2016-03-31 NOTE — Care Management Note (Addendum)
Case Management Note  Patient Details  Name: Regina Burke MRN: [id number] Date of Birth: Nov 27, 1932  Subjective/Objective:   Pt presented with acute on chronic CHF. Pt is from home with son. Per pt she will be going to [facility]F vs SNF in [place] once stable.                  Action/Plan: CM will make CSW aware. No needs at this time by CM. Will continue to monitor.   Expected Discharge Date:                  Expected Discharge Plan:  Assisted Living / Rest Home vs Skilled Nursing Facility  In-House Referral:  Clinical Social Work  Discharge planning Services  CM Consult  Post Acute Care Choice:  NA Choice offered to:  NA  DME Arranged:    DME Agency:  NA  HH Arranged:  NA HH Agency:  NA  Status of Service:  Completed, signed off  If discussed at [employer] of Stay Meetings, dates discussed:    Additional Comments: 1100 04-03-16 Jacqlyn Krauss, RN,BSN [phone] Pt plan for d/c today to [facility] in [place] assisting with disposition needs.  Bethena Roys, RN 03/31/2016, 8:50 AM

## 2016-03-31 NOTE — Progress Notes (Signed)
Orthopedic Tech Progress Note Patient Details:  Regina Burke 1932-09-17 [id number]  Ortho Devices Type of Ortho Device: Arm sling Ortho Device/Splint Interventions: Application   [place] Pink 03/31/2016, 5:46 PM

## 2016-03-31 NOTE — Care Management Obs Status (Signed)
[place] NOTIFICATION   Patient Details  Name: Regina Burke MRN: [id number] Date of Birth: 1933-04-17   Medicare Observation Status Notification Given:  Yes    Bethena Roys, RN 03/31/2016, 8:49 AM

## 2016-03-31 NOTE — Anesthesia Procedure Notes (Signed)
Procedure Name: MAC Performed by: YACOUB, Libi Corso B Pre-anesthesia Checklist: Patient identified, Emergency Drugs available, Suction available, Patient being monitored and Timeout performed Patient Re-evaluated:Patient Re-evaluated prior to inductionOxygen Delivery Method: Simple face mask Preoxygenation: Pre-oxygenation with 100% oxygen Intubation Type: IV induction Placement Confirmation: positive ETCO2 Dental Injury: Teeth and Oropharynx as per pre-operative assessment      

## 2016-03-31 NOTE — Anesthesia Preprocedure Evaluation (Addendum)
Anesthesia Evaluation  Patient identified by MRN, date of birth, ID band Patient awake    Reviewed: Allergy & Precautions, H&P , NPO status , Patient's Chart, lab work & pertinent test results, reviewed documented beta blocker date and time   Airway Mallampati: II  TM Distance: >3 FB Neck ROM: Full    Dental no notable dental hx. (+) Edentulous Upper, Edentulous Lower, Dental Advisory Given   Pulmonary neg pulmonary ROS, asthma , sleep apnea and Oxygen sleep apnea , COPD, former smoker,    Pulmonary exam normal breath sounds clear to auscultation       Cardiovascular hypertension, + CAD, + Cardiac Stents and +CHF  negative cardio ROS  + dysrhythmias Atrial Fibrillation  Rhythm:Irregular Rate:Normal     Neuro/Psych  Headaches, Depression negative neurological ROS  negative psych ROS   GI/Hepatic negative GI ROS, Neg liver ROS, GERD  ,  Endo/Other  negative endocrine ROSdiabetes  Renal/GU Renal InsufficiencyRenal diseasenegative Renal ROS  negative genitourinary   Musculoskeletal  (+) Arthritis , Osteoarthritis,    Abdominal   Peds  Hematology negative hematology ROS (+) anemia ,   Anesthesia Other Findings   Reproductive/Obstetrics negative OB ROS                            Anesthesia Physical Anesthesia Plan  ASA: III  Anesthesia Plan: MAC   Post-op Pain Management:    Induction: Intravenous  Airway Management Planned: Simple Face Mask  Additional Equipment:   Intra-op Plan:   Post-operative Plan:   Informed Consent: I have reviewed the patients History and Physical, chart, labs and discussed the procedure including the risks, benefits and alternatives for the proposed anesthesia with the patient or authorized representative who has indicated his/her understanding and acceptance.   Dental advisory given  Plan Discussed with: CRNA  Anesthesia Plan Comments:         Anesthesia Quick Evaluation

## 2016-03-31 NOTE — Progress Notes (Signed)
SUBJECTIVE: The patient states that she is tired this morning. She thinks she can lie flat for procedure today   CURRENT MEDICATIONS: . amiodarone  400 mg Oral BID  . docusate sodium  100 mg Oral BID  . ferrous sulfate  325 mg Oral Q breakfast  . fluticasone furoate-vilanterol  1 puff Inhalation Daily  . furosemide  40 mg Intravenous Q12H  . gentamicin irrigation  80 mg Irrigation To Cath  . guaiFENesin  600 mg Oral BID  . insulin aspart  0-9 Units Subcutaneous TID WC  . metoprolol tartrate  50 mg Oral BID  . pantoprazole  40 mg Oral Daily  . sertraline  50 mg Oral Daily  . sodium chloride flush  10-40 mL Intravenous BID  . tiotropium  18 mcg Inhalation Daily  . vancomycin  1,000 mg Intravenous To Cath   . sodium chloride 50 mL/hr (03/31/16 0509)    OBJECTIVE: Physical Exam: Vitals:   03/30/16 2006 03/30/16 2209 03/31/16 0102 03/31/16 0440  BP: 92/60 101/75  (!) 100/59  Pulse: (!) 101 96 92 94  Resp: '18  16 18  '$ Temp: 98.2 F (36.8 C)   98.1 F (36.7 C)  TempSrc: Oral   Oral  SpO2: 100%   98%  Weight:    188 lb (85.3 kg)    Intake/Output Summary (Last 24 hours) at 03/31/16 0715 Last data filed at 03/31/16 0443  Gross per 24 hour  Intake              490 ml  Output             1400 ml  Net             -910 ml    Telemetry reveals rate controlled AF  GEN- The patient is elderly and chronically ill appearing, alert and oriented x 3 today.   Head- normocephalic, atraumatic Eyes-  Sclera clear, conjunctiva pink Ears- hearing intact Oropharynx- clear Neck- supple  Lungs- Clear to ausculation bilaterally, normal work of breathing Heart- Irregular rate and rhythm  GI- soft, NT, ND, + BS Extremities- no clubbing, cyanosis, or edema Skin- no rash or lesion Psych- euthymic mood, full affect Neuro- strength and sensation are intact  LABS: Basic Metabolic Panel:  Recent Labs  03/29/16 0500 03/30/16 0530 03/31/16 0512  NA 140 139 140  K 4.0 4.1 4.1  CL  98* 97* 96*  CO2 33* 35* 35*  GLUCOSE 156* 107* 110*  BUN 84* 85* 78*  CREATININE 3.05* 2.91* 2.76*  CALCIUM 8.4* 8.2* 8.6*  PHOS 3.8  3.9  --   --    Liver Function Tests:  Recent Labs  03/29/16 0500  ALBUMIN 3.3*  3.3*   CBC:  Recent Labs  03/31/16 0512  WBC 3.5*  HGB 8.1*  HCT 27.2*  MCV 88.9  PLT 155    RADIOLOGY: Dg Chest 2 View Result Date: 03/27/2016 CLINICAL DATA:  Pulmonary edema EXAM: CHEST  2 VIEW COMPARISON:  03/24/2010 FINDINGS: Cardiac shadow is stable. Aortic calcifications are again seen. Bilateral pleural effusions are again noted. Increasing bibasilar infiltrates are seen when compare with the prior exam. Left jugular central line is again noted and stable. IMPRESSION: Stable pleural effusions with increasing bibasilar infiltrates. Electronically Signed   By: Inez Catalina M.D.   On: 03/27/2016 11:16  ASSESSMENT AND PLAN:  Principal Problem:   Acute on chronic combined systolic and diastolic CHF, NYHA class 3 (HCC) Active Problems:   Acute on  chronic respiratory failure with hypoxia and hypercapnia (HCC) - secondary to COPD exacerbation, now with A. fib RVR   Coronary artery disease involving native coronary artery of native heart without angina pectoris   Acute on chronic renal failure (HCC)   Chronic obstructive pulmonary disease with acute exacerbation (HCC)   Essential hypertension   Persistent atrial fibrillation ([place])  1.  Permanent atrial fibrillation Plan for AVN ablation and CRTP implant today Would prefer to have L IJ removed prior to procedure Ataya Murdy see if we can get bilateral peripheral IV's Continue Eliquis long term for CHADS2VASC of 6  2.  Acute on chronic combined systolic and diastolic heart failure Improved with diuresis Erianna Jolly continue IV Lasix today and watch renal function   3.  CAD No recent ischemic symptoms  4.  Acute on chronic renal disease, stage IV Renal was following at [facility]  BMET stable this morning  Chanetta Marshall,  NP 03/31/2016 10:50 AM  I have seen and examined this patient with Chanetta Marshall.  Agree with above, note added to reflect my findings.  On exam, irregular rhythm, no murmurs, lungs clear.  Continued atrial fibrillation.  Plan for CRT-P and AVN ablation today.  Risks and benefits discussed.  Risks include bleeding, tamponade, infection, and pneumothorax.  The patient understands these risks and has agreed to the procedure.    Parys Elenbaas M. Goddess Gebbia MD 03/31/2016 12:50 PM

## 2016-03-31 NOTE — Transfer of Care (Signed)
Immediate Anesthesia Transfer of Care Note  Patient: Regina Burke  Procedure(s) Performed: Procedure(s): BiV Pacemaker Insertion CRT-P (N/A) AV Node Ablation (N/A)  Patient Location: Cath Lab  Anesthesia Type:MAC  Level of Consciousness: awake, alert  and oriented  Airway & Oxygen Therapy: Patient Spontanous Breathing and Patient connected to face mask oxygen  Post-op Assessment: Report given to RN, Post -op Vital signs reviewed and stable and Patient moving all extremities  Post vital signs: Reviewed and stable  Last Vitals:  Vitals:   03/31/16 0440 03/31/16 0740  BP: (!) 100/59   Pulse: 94 97  Resp: 18 (!) 21  Temp: 36.7 C     Last Pain:  Vitals:   03/31/16 0845  TempSrc:   PainSc: 0-No pain      Patients Stated Pain Goal: 0 (97/95/36 9223)  Complications: No apparent anesthesia complications

## 2016-03-31 NOTE — Progress Notes (Signed)
Site area: rt groin Site Prior to Removal:  Level 0 Pressure Applied For:  15  minutes Manual:   yes Patient Status During Pull:  stable Post Pull Site:  Level  0 Post Pull Instructions Given:  yes Post Pull Pulses Present: yes Dressing Applied:  Small tegaderm Bedrest begins @ 1464 Comments:

## 2016-04-01 ENCOUNTER — Encounter (HOSPITAL_COMMUNITY): Payer: Self-pay | Admitting: Physician Assistant

## 2016-04-01 ENCOUNTER — Other Ambulatory Visit: Payer: Self-pay | Admitting: Physician Assistant

## 2016-04-01 ENCOUNTER — Observation Stay (HOSPITAL_COMMUNITY): Payer: Medicare Other

## 2016-04-01 DIAGNOSIS — Z9889 Other specified postprocedural states: Secondary | ICD-10-CM

## 2016-04-01 DIAGNOSIS — I130 Hypertensive heart and chronic kidney disease with heart failure and stage 1 through stage 4 chronic kidney disease, or unspecified chronic kidney disease: Secondary | ICD-10-CM | POA: Diagnosis not present

## 2016-04-01 DIAGNOSIS — E1122 Type 2 diabetes mellitus with diabetic chronic kidney disease: Secondary | ICD-10-CM | POA: Diagnosis not present

## 2016-04-01 DIAGNOSIS — Z950 Presence of cardiac pacemaker: Secondary | ICD-10-CM | POA: Diagnosis present

## 2016-04-01 DIAGNOSIS — N189 Chronic kidney disease, unspecified: Secondary | ICD-10-CM

## 2016-04-01 DIAGNOSIS — I5043 Acute on chronic combined systolic (congestive) and diastolic (congestive) heart failure: Secondary | ICD-10-CM | POA: Diagnosis not present

## 2016-04-01 DIAGNOSIS — I447 Left bundle-branch block, unspecified: Secondary | ICD-10-CM

## 2016-04-01 DIAGNOSIS — I481 Persistent atrial fibrillation: Secondary | ICD-10-CM | POA: Diagnosis not present

## 2016-04-01 LAB — CBC
HEMATOCRIT: 26.6 % — AB (ref 36.0–46.0)
Hemoglobin: 8 g/dL — ABNORMAL LOW (ref 12.0–15.0)
MCH: 26.4 pg (ref 26.0–34.0)
MCHC: 30.1 g/dL (ref 30.0–36.0)
MCV: 87.8 fL (ref 78.0–100.0)
PLATELETS: 177 10*3/uL (ref 150–400)
RBC: 3.03 MIL/uL — ABNORMAL LOW (ref 3.87–5.11)
RDW: 19 % — AB (ref 11.5–15.5)
WBC: 3.9 10*3/uL — AB (ref 4.0–10.5)

## 2016-04-01 LAB — BASIC METABOLIC PANEL
Anion gap: 9 (ref 5–15)
BUN: 67 mg/dL — AB (ref 6–20)
CHLORIDE: 97 mmol/L — AB (ref 101–111)
CO2: 32 mmol/L (ref 22–32)
CREATININE: 2.56 mg/dL — AB (ref 0.44–1.00)
Calcium: 8.5 mg/dL — ABNORMAL LOW (ref 8.9–10.3)
GFR calc non Af Amer: 17 mL/min — ABNORMAL LOW (ref >60)
GFR, EST AFRICAN AMERICAN: 19 mL/min — AB (ref >60)
GLUCOSE: 107 mg/dL — AB (ref 65–99)
POTASSIUM: 3.8 mmol/L (ref 3.5–5.1)
SODIUM: 138 mmol/L (ref 135–145)

## 2016-04-01 LAB — GLUCOSE, CAPILLARY
Glucose-Capillary: 125 mg/dL — ABNORMAL HIGH (ref 65–99)
Glucose-Capillary: 165 mg/dL — ABNORMAL HIGH (ref 65–99)
Glucose-Capillary: 116 mg/dL — ABNORMAL HIGH (ref 65–99)
Glucose-Capillary: 155 mg/dL — ABNORMAL HIGH (ref 65–99)

## 2016-04-01 MED ORDER — APIXABAN 2.5 MG PO TABS
2.5000 mg | ORAL_TABLET | Freq: Two times a day (BID) | ORAL | Status: DC
  Administered 2016-04-01 – 2016-04-03 (×4): 2.5 mg via ORAL
  Filled 2016-04-01 (×4): qty 1

## 2016-04-01 MED ORDER — FUROSEMIDE 40 MG PO TABS: 40.0000 mg | ORAL_TABLET | Freq: Every day | ORAL | 6 refills | Status: DC

## 2016-04-01 NOTE — Progress Notes (Signed)
entered in error   

## 2016-04-01 NOTE — Progress Notes (Signed)
    Patient could not get bed at rehab facility until Monday, will have to keep inpatient until then. Continue current meds and will resume Eliquis 2.'5mg'$  BID and will get some basic labs.    Angelena Form PA-C  [facility]

## 2016-04-01 NOTE — Discharge Instructions (Signed)
° ° °  Supplemental Discharge Instructions for  Pacemaker/Defibrillator Patients  Activity No heavy lifting or vigorous activity with your left/right arm for 6 to 8 weeks.  Do not raise your left/right arm above your head for one week.  Gradually raise your affected arm as drawn below.       04/02/16                    8/7                            8/8                          8/9  _  NO DRIVING for 1 week    ; you may begin driving on    9/52/84 .  WOUND CARE - Keep the wound area clean and dry.  Do not get this area wet for one week. No showers for one week; you may shower on  04/07/16  . - The tape/steri-strips on your wound will fall off; do not pull them off.  No bandage is needed on the site.  DO  NOT apply any creams, oils, or ointments to the wound area. - If you notice any drainage or discharge from the wound, any swelling or bruising at the site, or you develop a fever > 101? F after you are discharged home, call the office at once.  Special Instructions - You are still able to use cellular telephones; use the ear opposite the side where you have your pacemaker/defibrillator.  Avoid carrying your cellular phone near your device. - When traveling through airports, show security personnel your identification card to avoid being screened in the metal detectors.  Ask the security personnel to use the hand wand. - Avoid arc welding equipment, MRI testing (magnetic resonance imaging), TENS units (transcutaneous nerve stimulators).  Call the office for questions about other devices. - Avoid electrical appliances that are in poor condition or are not properly grounded. - Microwave ovens are safe to be near or to operate.  Additional information for defibrillator patients should your device go off: - If your device goes off ONCE and you feel fine afterward, notify the device clinic nurses. - If your device goes off ONCE and you do not feel well afterward, call 911. - If your device goes off  TWICE, call 911. - If your device goes off THREE times in one day, call 911.  DO NOT DRIVE YOURSELF OR A FAMILY MEMBER WITH A DEFIBRILLATOR TO THE HOSPITAL--CALL 911.

## 2016-04-01 NOTE — Progress Notes (Signed)
Discharge delayed as SNF cannot order BiPAP machine until Monday.  Hilbert Corrigan PA Pager: [phone]

## 2016-04-01 NOTE — Progress Notes (Signed)
PT Cancellation Note  Patient Details Name: Regina Burke MRN: [id number] DOB: 05/23/1933   Cancelled Treatment:    Reason Eval/Treat Not Completed: Other (comment).  Checked on pt at 11:56 am and she was eating lunch EOB, then again at 12:38 and she was having a BM and getting bathed by RN staff, the again at 13:36 and she was exhausted, did not feel she could work with me at that time.  She was up in the chair.  PT will check back tomorrow to re-assess post pacemaker and ablation.    Thanks,    Barbarann Ehlers. [place], [place], DPT [phone]   04/01/2016, 2:01 PM

## 2016-04-01 NOTE — Progress Notes (Signed)
SUBJECTIVE: Feeling well this morning without complaint.  Had AV nodal ablation with CRT-P placement.  Feeling improved with HR of 80, paced, today.  CURRENT MEDICATIONS: . docusate sodium  100 mg Oral BID  . ferrous sulfate  325 mg Oral Q breakfast  . fluticasone furoate-vilanterol  1 puff Inhalation Daily  . furosemide  40 mg Intravenous Q12H  . guaiFENesin  600 mg Oral BID  . insulin aspart  0-9 Units Subcutaneous TID WC  . metoprolol tartrate  50 mg Oral BID  . pantoprazole  40 mg Oral Daily  . sertraline  50 mg Oral Daily  . sodium chloride flush  10-40 mL Intravenous BID  . sodium chloride flush  3 mL Intravenous Q12H  . tiotropium  18 mcg Inhalation Daily      OBJECTIVE: Physical Exam: Vitals:   03/31/16 1715 03/31/16 2009 03/31/16 2201 04/01/16 0501  BP: (!) 101/52 (!) 102/53 112/64 111/66  Pulse: 80 80 80 80  Resp: '18 16  18  '$ Temp: 98 F (36.7 C) 97.8 F (36.6 C)  97.5 F (36.4 C)  TempSrc:  Oral  Oral  SpO2: 95% 99%  98%  Weight:    198 lb 1.6 oz (89.9 kg)    Intake/Output Summary (Last 24 hours) at 04/01/16 0830 Last data filed at 04/01/16 3536  Gross per 24 hour  Intake             1440 ml  Output             2250 ml  Net             -810 ml    Telemetry reveals  AF, V paced  GEN- The patient is elderly and chronically ill appearing, alert and oriented x 3 today.   Head- normocephalic, atraumatic Eyes-  Sclera clear, conjunctiva pink Ears- hearing intact Oropharynx- clear Neck- supple  Lungs- Clear to ausculation bilaterally, normal work of breathing Heart- Irregular rate and rhythm  GI- soft, NT, ND, + BS Extremities- no clubbing, cyanosis, or edema Skin- no rash or lesion Psych- euthymic mood, full affect Neuro- strength and sensation are intact  LABS: Basic Metabolic Panel:  Recent Labs  03/30/16 0530 03/31/16 0512  NA 139 140  K 4.1 4.1  CL 97* 96*  CO2 35* 35*  GLUCOSE 107* 110*  BUN 85* 78*  CREATININE 2.91* 2.76*    CALCIUM 8.2* 8.6*   Liver Function Tests: No results for input(s): AST, ALT, ALKPHOS, BILITOT, PROT, ALBUMIN in the last 72 hours. CBC:  Recent Labs  03/31/16 0512  WBC 3.5*  HGB 8.1*  HCT 27.2*  MCV 88.9  PLT 155    RADIOLOGY: Dg Chest 2 View Result Date: 03/27/2016 CLINICAL DATA:  Pulmonary edema EXAM: CHEST  2 VIEW COMPARISON:  03/24/2010 FINDINGS: Cardiac shadow is stable. Aortic calcifications are again seen. Bilateral pleural effusions are again noted. Increasing bibasilar infiltrates are seen when compare with the prior exam. Left jugular central line is again noted and stable. IMPRESSION: Stable pleural effusions with increasing bibasilar infiltrates. Electronically Signed   By: Inez Catalina M.D.   On: 03/27/2016 11:16  ASSESSMENT AND PLAN:  Principal Problem:   Acute on chronic combined systolic and diastolic CHF, NYHA class 3 (HCC) Active Problems:   Acute on chronic respiratory failure with hypoxia and hypercapnia (HCC) - secondary to COPD exacerbation, now with A. fib RVR   Coronary artery disease involving native coronary artery of native heart without angina pectoris  Acute on chronic renal failure (HCC)   Chronic obstructive pulmonary disease with acute exacerbation (HCC)   Essential hypertension   Persistent atrial fibrillation ([place])  1.  Permanent atrial fibrillation AVN ablation and CRTP implant yesterday Continue Eliquis long term for CHADS2VASC of 6 No pneumothorax on CXR, lead parameters stable  2.  Acute on chronic combined systolic and diastolic heart failure Improved with diuresis Talon Witting get dose of IV lasix today and transition to PO as outpatient.  Should go home with 40 mg daily with repeat BMP on Tuesday or Wednesday.  Plan in place for discharge to SNF   3.  CAD No recent ischemic symptoms  4.  Acute on chronic renal disease, stage IV Renal was following at [facility]  BMET stable this morning  Jalesa Thien M. Lucilia Yanni MD 04/01/2016 8:30 AM

## 2016-04-01 NOTE — Clinical Social Work Note (Addendum)
Per CSW handoff information, SNF cannot order Bipap machine over the weekend but will get it Monday.   Dayton Scrape, [place] [phone]  3:31 pm CSW spoke with CSW department's medical director regarding SNF being unable to order Bipap over the weekend. He advised to call SNF and find out why and to also try to find another placement for patient. There is no one to speak to at [facility] regarding admissions/bipap ordering. Patient has so far only been accepted at [facility]. CSW called and spoke with admissions and they are unable to order bipap over the weekend because the person that does that will not be back until Monday.  Dayton Scrape, [place]

## 2016-04-01 NOTE — Discharge Summary (Signed)
Discharge Summary    Patient ID: Regina Burke,  MRN: [id number], DOB/AGE: [birth year] 82 y.o.  Admit date: 03/30/2016 Discharge date: 04/01/2016  Primary Care Provider: Tracie Burke Primary Cardiologist: Dr. Rockey Burke / EP (Dr. Caryl Burke)   Discharge Diagnoses    Principal Problem:   Persistent atrial fibrillation [facility]) Active Problems:   Cardiac resynchronization therapy pacemaker (CRT-P) in place   S/P AV nodal ablation   Chronic kidney disease   Acute on chronic respiratory failure with hypoxia and hypercapnia (HCC) - secondary to COPD exacerbation, now with A. fib RVR   Coronary artery disease involving native coronary artery of native heart without angina pectoris   S/P coronary artery stent placement   Chronic obstructive pulmonary disease (HCC)   Acute on chronic renal failure (HCC)   Essential hypertension   Acute on chronic combined systolic and diastolic CHF, NYHA class 3 (HCC)   LBBB (left bundle branch block)   Allergies Allergies  Allergen Reactions  . Ciprofloxacin Shortness Of Breath, Itching and Rash  . Doxycycline Shortness Of Breath, Itching and Rash  . Penicillins Shortness Of Breath, Itching, Rash and Other (See Comments)    Has patient had a PCN reaction causing immediate rash, facial/tongue/throat swelling, SOB or lightheadedness with hypotension: Yes Has patient had a PCN reaction causing severe rash involving mucus membranes or skin necrosis: No Has patient had a PCN reaction that required hospitalization No Has patient had a PCN reaction occurring within the last 10 years: No If all of the above answers are "NO", then may proceed with Cephalosporin use.  . Sulfa Antibiotics Shortness Of Breath, Itching and Rash  . Latex Itching  . Morphine And Related Itching  . Albuterol Sulfate   . Cefuroxime Rash    Blisters in mouth     History of Present Illness     82 y.o. female with h/o CAD s/p PCI to OM branch (11/2014), persistent difficult  control Afib with RVR s/p recent prior DCCV which did not hold, anemia which has led to prior intermittnet usage of anticoagulation, chronic combined CHF, chronic respiratory failure 2/2 COPD, lung cancer s/p radiation, DM2, asthma, CKD stage III, cushing's disease, hypertensive heart disease, digoxin toxicity in the setting of renal failure, LBBB, HLD, and OSA who was transferred to [facility] from [facility] on 03/30/16 for AVN ablation and CRT-P.   She had been admitted to [facility] x 2 recently for acute on chronic respiratory failure, volume overload and difficult to control persistent Afib with RVR.   She was admitted in 11/2014 for chest pain and had PCI to OM branch. Prior EF from 10/2015 was 55-60% despite having frequent episodes of Afib and her CAD. She has previously been maintained on metoprolol, cardizem, and amiodarone. However, as of late her rate has been difficult to control leading to volume overload. She was recently admitted in early July for CHF exacerbation with persistent Afib with RVR s/p DCCV that was successful for less than 2 hours with difficulty noted coming out of sedation. EF was not assessed. She was seen by Dr. Caryl Comes, MD who discussed AV node ablation with PPM implantation. She was discharged before this could be done.  She returned to [name] in late July with recurrent SOB, volume overload, and persistent Afib with RVR. She required diuresis with Lasix gtt given renal disease as well as BiPAP. Echo from admission on 7/22 showed a newly depressed EF of 35-40%, diffuse HK, mild MR, LA moderately dilated, PASP 73 mm Hg. After  she was reasonably well rate controlled a repeat echo was done on 7/28 that showed EF 35-40%, LA severely dilated, moderately increased PASP, left pleural effusion. Recheck echo on 8/1 showed an EF of 30-35%, moderate MR, LA moderately dilated, PASP could not be estimated. She was seen by Dr. Caryl Burke again with plans to proceed with AV node ablation with implantation of BiV-ICD at  [facility] on 8/4 given her symptoms and continued depressed EF on echo, even with rate control.    Hospital Course     Consultants: none ( transferred to EP service)  Permanent atrial fibrillation: she underwent successful AVN ablation and CRT-P implant on 03/31/16 ( in the setting of LBBB and symptomatic systolic CHF) with a Medtronic Percepta Quad CRT-P MRI SureScan (serial Number [id number]) device. -- No pneumothorax on CXR, lead parameters stable today -- Continue Eliquis 2.'5mg'$  BID (creat >1.5 and age> 80) long term for CHADS2VASC of 6 as well as Toprol XL '25mg'$  daily. Dilt '240mg'$  and amio has been discontinued   Acute on chronic combined systolic and diastolic heart failure/ NICM -- Improved with diuresis. Discharge weight 198lbs. Regina Burke discharge on Lasix '40mg'$  daily. Plan for BMET on Tuesday or Wed and 1-2 week TOC visit.  -- Continue Toprol XL. No ACE/ARB due to CKD. Hopefully EF Regina Burke improve with resynchronization therapy.   CAD: stable with no recent ischemic symptoms. No ASA due to Eliquis use. Continue BB and statin.   Acute on chronic renal disease, stage IV: renal was following at [facility]. Creat 04/02/16 was 2.37 which is close to her baseline. BMET planned for Wednesday.   Dispo: being discharged to an inpatient rehab facility.   ADDENDUM: this discharge summary was originally written on 04/01/16 with plans to go inpatient rehab however, this was delayed due to to them not having BIPAP machine which she uses at night. She was kept inpatient due to this and Regina Burke be discharged today 04/03/16.   The patient has had an uncomplicated hospital course and is recovering well. The femoral catheter site is stable. She has been seen by Dr. Curt Burke today and deemed ready for discharge home. A staff message has been sent to the office to schedule follow up.  Discharge medications are listed below.  _____________  Discharge Vitals Blood pressure 111/66, pulse 80, temperature 97.5 F (36.4 C),  temperature source Oral, resp. rate 18, weight 198 lb 1.6 oz (89.9 kg), SpO2 98 %.  Filed Weights   03/31/16 0440 04/01/16 0501  Weight: 188 lb (85.3 kg) 198 lb 1.6 oz (89.9 kg)    Labs & Radiologic Studies     CBC  Recent Labs  03/31/16 0512  WBC 3.5*  HGB 8.1*  HCT 27.2*  MCV 88.9  PLT 045   Basic Metabolic Panel  Recent Labs  03/30/16 0530 03/31/16 0512  NA 139 140  K 4.1 4.1  CL 97* 96*  CO2 35* 35*  GLUCOSE 107* 110*  BUN 85* 78*  CREATININE 2.91* 2.76*  CALCIUM 8.2* 8.6*   Liver Function Tests No results for input(s): AST, ALT, ALKPHOS, BILITOT, PROT, ALBUMIN in the last 72 hours. No results for input(s): LIPASE, AMYLASE in the last 72 hours. Cardiac Enzymes No results for input(s): CKTOTAL, CKMB, CKMBINDEX, TROPONINI in the last 72 hours. BNP Invalid input(s): POCBNP D-Dimer No results for input(s): DDIMER in the last 72 hours. Hemoglobin A1C No results for input(s): HGBA1C in the last 72 hours. Fasting Lipid Panel No results for input(s): CHOL, HDL, LDLCALC, TRIG,  CHOLHDL, LDLDIRECT in the last 72 hours. Thyroid Function Tests No results for input(s): TSH, T4TOTAL, T3FREE, THYROIDAB in the last 72 hours.  Invalid input(s): FREET3  Dg Chest 1 View  Result Date: 03/04/2016 CLINICAL DATA:  Respiratory distress EXAM: CHEST 1 VIEW COMPARISON:  Chest radiograph dated 01/24/2016. CT chest dated 01/21/2016. FINDINGS: Patchy bilateral lower lobe opacities, suspicious for pneumonia, less likely atelectasis. Superimposed mild interstitial edema is suspected. Small bilateral pleural effusions. No pneumothorax. Again noted is a stable focal opacity in the medial right upper lobe, most of which likely reflects radiation changes when correlating with prior CT, underlying residual/recurrent tumor not excluded. Cardiomegaly. IMPRESSION: Patchy bilateral lower lobe opacities, suspicious for pneumonia, less likely atelectasis. Superimposed mild interstitial edema is  suspected. Small bilateral pleural effusions. Stable focal opacity in the medial right upper lobe, most of which likely reflects radiation changes when correlating with prior CT, underlying residual/recurrent tumor not excluded. Electronically Signed   By: Julian Hy M.D.   On: 03/04/2016 21:22   Dg Chest 2 View  Result Date: 04/01/2016 CLINICAL DATA:  S/P PACEMAKER INSERTION, PT UNABLE TO RAISE LEFT ARM FOR LATERAL PER MD ORDER EXAM: CHEST  2 VIEW COMPARISON:  03/27/2016 FINDINGS: Pacemaker overlies enlarged cardiac silhouette. There is bibasilar effusions and atelectasis not changed. Upper lungs are clear. There is scarring in the RIGHT upper lobe. IMPRESSION: 1. No significant change. 2. Cardiomegaly, bibasilar atelectasis and small effusions Electronically Signed   By: Suzy Bouchard M.D.   On: 04/01/2016 08:38   Dg Chest 2 View  Result Date: 03/27/2016 CLINICAL DATA:  Pulmonary edema EXAM: CHEST  2 VIEW COMPARISON:  03/24/2010 FINDINGS: Cardiac shadow is stable. Aortic calcifications are again seen. Bilateral pleural effusions are again noted. Increasing bibasilar infiltrates are seen when compare with the prior exam. Left jugular central line is again noted and stable. IMPRESSION: Stable pleural effusions with increasing bibasilar infiltrates. Electronically Signed   By: Inez Catalina M.D.   On: 03/27/2016 11:16  Dg Abd 1 View  Result Date: 03/16/2016 CLINICAL DATA:  Evaluate OG tube EXAM: ABDOMEN - 1 VIEW COMPARISON:  None. FINDINGS: The side port of the OG tube is near the GE junction. The distal tip is in the stomach. See the chest x-ray report from the same time for description of the lung bases. No other acute abnormalities. IMPRESSION: The side port of the OG tube is near the GE junction. Consider advancement of the tube further into the stomach. Electronically Signed   By: Dorise Bullion III M.D   On: 03/16/2016 18:28   Ct Chest Wo Contrast  Result Date: 03/07/2016 CLINICAL DATA:   History of COPD with respiratory distress for the past 3 days EXAM: CT CHEST WITHOUT CONTRAST TECHNIQUE: Multidetector CT imaging of the chest was performed following the standard protocol without IV contrast. COMPARISON:  03/04/2016, 01/21/2016 FINDINGS: Cardiovascular: Calcifications of the thoracic aorta are noted without aneurysmal dilatation. The cardiac structures show coronary calcifications. Mediastinum/Nodes: The thoracic inlet demonstrates some nodularity within the right lobe of the thyroid. This is stable from the prior exam. No significant hilar or mediastinal adenopathy is noted. Lungs/Pleura: Bilateral pleural effusions are seen left slightly greater than right. Some associated lower lobe consolidation is noted again left greater than right. These changes are relatively stable from the prior exam. The ground-glass density seen in the left upper lobe is again identified and has improved somewhat in the interval from the prior exam. Scarring in the right upper lobe is again  noted stable from the prior exam as well. No new focal infiltrate is seen. Upper Abdomen: Stable cystic lesions are again seen in the spleen. Right adrenal lesion is again noted and stable. Musculoskeletal: No acute bony abnormality noted. IMPRESSION: Bilateral pleural effusions and associated lower lobe consolidation stable from the prior study. Stable changes in the right upper lobe similar to that seen on the recent exam. Slight improvement in the ground-glass density within the left upper lobe. Electronically Signed   By: Inez Catalina M.D.   On: 03/07/2016 16:36   Dg Chest Port 1 View  Result Date: 03/24/2016 CLINICAL DATA:  Dyspnea.  History of asthma, COPD, lung cancer. EXAM: PORTABLE CHEST 1 VIEW COMPARISON:  03/20/2016 FINDINGS: Left internal jugular approach central venous catheter unchanged. Cardiomediastinal silhouette is mildly enlarged. Mediastinal contours appear intact. Calcific atherosclerotic disease of the  aorta. There is no evidence of pneumothorax. There are persistent bilateral pleural effusions, slightly enlarged from the prior radiograph. Postsurgical/posttreatment changes in the right upper lobe are stable. Stable prominence of the right hilum. Osseous structures are without acute abnormality. Soft tissues are grossly normal. IMPRESSION: Slightly enlarged bilateral pleural effusions with bilateral lower lobe airspace consolidation versus atelectasis. Stable posttreatment changes in the right upper lobe. Stable prominence of the right hilum. Electronically Signed   By: Fidela Salisbury M.D.   On: 03/24/2016 11:27  Dg Chest Port 1 View  Result Date: 03/20/2016 CLINICAL DATA:  Respiratory failure. EXAM: PORTABLE CHEST 1 VIEW COMPARISON:  03/19/2016.  01/23/2016. FINDINGS: Left IJ line in stable position. Cardiomegaly with progressive bilateral pulmonary interstitial prominence and bilateral pleural effusions consistent with progressive congestive heart failure. Persistent density in the right upper lobe without significant interim change and most likely related to radiation change. Surgical clips right upper chest. IMPRESSION: 1.  Left IJ line stable position. 2. Congestive heart failure with bilateral pulmonary interstitial edema and pleural effusions. 3. Persistent density in the right upper lobe most consistent with scarring and/or radiation change. Electronically Signed   By: Marcello Moores  Register   On: 03/20/2016 07:11  Dg Chest Port 1 View  Result Date: 03/19/2016 CLINICAL DATA:  Respiratory failure EXAM: PORTABLE CHEST 1 VIEW COMPARISON:  03/18/2016 FINDINGS: Cardiomegaly. No frank interstitial edema. Moderate left and small right pleural effusions. Associated bilateral lower lobe opacities, likely atelectasis. Stable platelike opacity in the right upper lobe, presumably radiation changes. Left IJ venous catheter terminates in the lower SVC. IMPRESSION: Moderate left and small right pleural effusions.  No frank interstitial edema. Electronically Signed   By: Julian Hy M.D.   On: 03/19/2016 11:44  Dg Chest Port 1 View  Result Date: 03/18/2016 CLINICAL DATA:  Respiratory failure. EXAM: PORTABLE CHEST 1 VIEW COMPARISON:  03/17/2016 and CT chest 03/07/2016, 01/21/2016. FINDINGS: Interval extubation. Nasogastric tube is been removed as well. Left IJ central line tip projects over the SVC. Basilar dependent airspace opacification with left lower lobe collapse/ consolidation. Small right pleural effusion and moderate left pleural effusion. Oblong opacity in the apex of the right hemi thorax is seen with fiducial markers, stable. IMPRESSION: 1. Basilar dependent airspace opacification with bilateral pleural effusions. Findings may be due to congestive heart failure. 2. Left lower lobe collapse/consolidation. 3. Oblong opacity with fiducial markers in the right upper lobe, better evaluated on cross-sectional imaging 03/07/2016 and 01/21/2016. Electronically Signed   By: Lorin Picket M.D.   On: 03/18/2016 07:59   Dg Chest Port 1 View  Result Date: 03/17/2016 CLINICAL DATA:  Acute respiratory failure. EXAM:  PORTABLE CHEST 1 VIEW COMPARISON:  Chest radiograph March 16, 2016 FINDINGS: Chest radiograph is enlarged, similar to prior imaging. Calcified aortic knob. Similar pulmonary vascular congestion with increasing RIGHT, similar LEFT small to moderate pleural effusions. Interstitial prominence. Bibasilar consolidation. Similar fullness of the hilum. No pneumothorax. Endotracheal tube tip projects 2.5 cm above the carina. Nasogastric tube past proximal stomach, distal tip not imaged. LEFT internal jugular central venous catheter distal tip projects in mid superior vena cava. Soft tissue planes and included osseous structures are nonsuspicious. Surgical clips project in LEFT upper abdomen. IMPRESSION: Stable cardiomegaly and pulmonary edema with increasing RIGHT, similar LEFT small to moderate pleural  effusions. Underlying consolidation. No apparent change in life-support lines. Electronically Signed   By: Elon Alas M.D.   On: 03/17/2016 04:38   Dg Chest Port 1 View  Result Date: 03/16/2016 CLINICAL DATA:  Line placement EXAM: PORTABLE CHEST 1 VIEW COMPARISON:  March 16, 2016 FINDINGS: The ETT is in good position. A new left central line terminates in mid SVC. No pneumothorax is identified. Opacity in the medial right upper lung is similar in the interval. The left-sided pleural effusion with underlying opacity is stable. The effusion and opacity in the right base has improved. No change in cardiomegaly. No other interval changes. IMPRESSION: A new left IJ is identified in good position with no pneumothorax. The left-sided effusion and opacity is stable while the right-sided effusion and opacity has improved. Electronically Signed   By: Dorise Bullion III M.D   On: 03/16/2016 18:28   Dg Chest Port 1 View  Result Date: 03/16/2016 CLINICAL DATA:  Status post intubation and NG tube placement. Shortness of breath. EXAM: PORTABLE CHEST 1 VIEW COMPARISON:  Single-view of the chest earlier today. FINDINGS: NG tube is seen with the side port at the gastroesophageal junction and tip just within the stomach. The tube should be advanced approximately 5 cm for better positioning. New endotracheal tube is in place with the tip in good position at the level of the clavicular heads. Bilateral effusions and airspace disease persist without marked change since the study earlier today. IMPRESSION: ET tube in good position. NG tube should be advanced approximately 5 cm for better positioning. Bilateral pleural effusions and basilar airspace disease. Electronically Signed   By: Inge Rise M.D.   On: 03/16/2016 15:52   Dg Chest Port 1 View  Result Date: 03/16/2016 CLINICAL DATA:  Difficulty breathing today. EXAM: PORTABLE CHEST 1 VIEW COMPARISON:  Single-view of the chest 03/10/2016 and 03/04/2016. CT chest  03/07/2016. FINDINGS: Moderate bilateral pleural effusions have increased in size since the most recent examination. Associated basilar atelectasis is noted. There is cardiomegaly and mild interstitial edema. IMPRESSION: Increased moderate bilateral pleural effusions and basilar atelectasis. Cardiomegaly and mild interstitial edema. Electronically Signed   By: Inge Rise M.D.   On: 03/16/2016 14:38   Dg Chest Port 1 View  Result Date: 03/10/2016 CLINICAL DATA:  Acute respiratory failure, asthma -COPD, CHF, coronary artery disease EXAM: PORTABLE CHEST 1 VIEW COMPARISON:  Portable chest x-ray of March 04, 2016 and chest CT scan of March 07, 2016 FINDINGS: The lungs are well-expanded. There remain bilateral pleural effusions which have increased slightly in volume. The pulmonary vascularity is less engorged. The interstitial markings are less prominent. The cardiac silhouette remains enlarged. There is calcification in the wall of the aortic arch. IMPRESSION: Mild interval improvement in pulmonary interstitial edema and bilateral pleural effusions. Persistent left basilar atelectasis or pneumonia. Electronically Signed  By: David  Martinique M.D.   On: 03/10/2016 07:51     Diagnostic Studies/Procedures    03/31/16 AV Node Ablation  BiV Pacemaker Insertion CRT-P  Conclusion   SURGEON: Genea Rheaume, MD   PREPROCEDURE DIAGNOSES:  1. Nonischemic cardiomyopathy.  2. New York Heart Association class II, heart failure chronically.  3. Chronic atrial fibrillation  POSTPROCEDURE DIAGNOSES:  1. Nonischemic cardiomyopathy.  2. New York Heart Association class II heart failure chronically.  3. Chronic atrial fibrillation   PROCEDURES:  1. AV node ablation 2. Biventricular ICD implantation.   INTRODUCTION:  CHYANE GREER is a 82 y.o. female with a nonischemic CM (EF 30-35%), NYHA Class II CHF, and chronic atrial fibrillation. At this time, she has had issues with rate control of her atrial  fibrillation. Given the fact that she is having an AV nodal ablation, the patient may also be expected to benefit from resynchronization therapy.   DESCRIPTION OF PROCEDURE: Informed written consent was obtained and the patient was brought to the electrophysiology lab in the fasting state. The patient was adequately sedated per anesthesia report. The patient's right groin was prepped and draped in a sterile fashion by the EP lab staff. Using the modified Seldinger technique, one 8 [place] and one 6 [place] sheath were placed in the right femoral vein. The 6 French Bard Quad waist of the right ventricular apex revealed right femoral vein. An 8 mm Blazer Prime ablation catheter was placed through the right femoral vein into the inferior vena cava. From their attention was paid to the placement of the pacemaker.  The patient's left chest was prepped and draped in the usual sterile fashion by the EP lab staff. The skin overlying the left deltopectoral region was infiltrated with lidocaine for local analgesia. A 5-cm incision was made over the left deltopectoral region. A left subcutaneous defibrillator pocket was fashioned using a combination of sharp and blunt dissection. Electrocautery was used to assure hemostasis.   RA/RV Lead Placement:  The left axillary vein was cannulated with fluoroscopic visualization. No contrast was required for this endeavor. Through the left axillary vein, a Medtronic model [id number] (serial number [id number]) right ventricular lead was advanced with fluoroscopic visualization into the  right ventricular apex position. The right ventricular lead R-wave measured 11.3 mV with impedance of 888 ohms and a threshold of 1.1 volts at 0.5 milliseconds.   LV Lead Placement:  A Medtronic MB-2 guide was advanced through the left axillary vein into the low lateral right atrium. A Vers acore wire was introduced through the MB-2 guide and used to cannulate the coronary sinus. Coronary sinus  cannulation was confirmed with electrogram recording from the hexapolar catheter. A selective coronary sinus venogram was performed by hand injection of nonionic contrast. This demonstrated a large CS body with very small/ atretic distal branches. There was a moderate sized lateral coronary sinus branch was noted along the mid portion of the CS body. No other posterior branches were identified.  A Whisper CSJ wire was introduced through the transseptal sheath and advanced into the distal posterolateral branch. A Medtronic model 4298 - 88 (serial number [id number] ) lead was advanced through the MB-2 into the lateral branch. This was approximately one-thirds from the base to the apex in a very lateral position. In this location, the left ventricular lead R-waves measured 7.9 mV with impedance of 1103 ohms and a threshold of 1.9 volt at 0.5 milliseconds in the bipolar LV1-LV2 configuration with no diaphragmatic stimulation observed when pacing at  10 volts output. The MB-2 guide was therefore removed.  All three leads were secured to the pectoralis fascia using #2 silk suture over the suture sleeves. The pocket then irrigated with copious gentamicin solution. The leads were then connected to a Medtronic modelPercepta Quad CRT-P MRI SureScan (serial Number [id number]) device. The pacemaker was placed into the pocket. The pocket was then closed in 3 layers with 2.0 Vicryl suture for the subcutaneous and 3.0 Vicryl suture subcuticular layers. Steri-Strips and a sterile dressing were then applied.   After 30 minutes, conduction had not returned over the AV node. The procedure was thus concluded.  CONCLUSIONS:  1. Nonischemic cardiomyopathy with chronic New York Heart Association class II heart failure.  2. Successful biventricular pacemaker implantation.  3. Successful AV nodal ablation 4. No early apparent complications.      Procedures : 03/31/16 AV Node Ablation  BiV Pacemaker Insertion CRT-P   Conclusion  SURGEON: Elany Felix, MD   PREPROCEDURE DIAGNOSES:  1. Nonischemic cardiomyopathy.  2. New York Heart Association class II, heart failure chronically.  3. Chronic atrial fibrillation  POSTPROCEDURE DIAGNOSES:  1. Nonischemic cardiomyopathy.  2. New York Heart Association class II heart failure chronically.  3. Chronic atrial fibrillation   PROCEDURES:  1. AV node ablation 2. Biventricular ICD implantation     _____________  2D ECHO: 03/28/2016 LV EF: 30% -   35% Study Conclusions: - Left ventricle: The cavity size was normal. There was mild   concentric hypertrophy. Systolic function was moderately to   severely reduced. The estimated ejection fraction was in the   range of 30% to 35%. Regional wall motion abnormalities cannot be   excluded. Difficult to deretmine secondary to bundle branch block   and atrial fibrillation The study is not technically sufficient   to allow evaluation of LV diastolic function. - Mitral valve: There was moderate regurgitation. - Left atrium: The atrium was mildly to moderately dilated. - Right ventricle: Systolic function was normal. - Pulmonary arteries: Systolic pressure could not be accurately   estimated. Impressions: - Rhythm is atrial fibrillation with rate 110 bpm. LIMITED STUDY.   Images suggestive of a pleural effusion on the left.   Disposition   Pt is being discharged to inpatient rehab facility today in good condition.  Follow-up Plans & Appointments    Follow-up Information    Virl Axe, MD .   Specialty:  Cardiology Why:  The office Tramell Piechota call you to make an appointment for a wound check in 10 days and ab work in 3-4 days to check your kidneys.  Contact information: [place] [address] [phone]        Ida Rogue, MD .   Specialty:  Cardiology Why:  the office Sayla Golonka call to make an appt in 1-2 weeks with Dr. Rockey Burke or Christell Faith PA-c Contact  information: [place] [address] [phone]          Discharge Instructions    Amb referral to AFIB Clinic    Complete by:  As directed      Discharge Medications     Medication List    STOP taking these medications   amiodarone 400 MG tablet Commonly known as:  PACERONE   DILT-XR 240 MG 24 hr capsule Generic drug:  diltiazem     TAKE these medications   ALPRAZolam 0.25 MG tablet Commonly known as:  XANAX Take 1 tablet (0.25 mg total) by mouth  at bedtime as needed for anxiety. What changed:  when to take this  reasons to take this   apixaban 2.5 MG Tabs tablet Commonly known as:  ELIQUIS Take 1 tablet (2.5 mg total) by mouth every 12 (twelve) hours.   atorvastatin 20 MG tablet Commonly known as:  LIPITOR Take 1 tablet (20 mg total) by mouth every evening.   BREO ELLIPTA 100-25 MCG/INH Aepb Generic drug:  fluticasone furoate-vilanterol Inhale 1 puff into the lungs daily.   cholecalciferol 400 units Tabs tablet Commonly known as:  VITAMIN D Take 400 Units by mouth every evening.   cyanocobalamin 500 MCG tablet Take 500 mcg by mouth every evening.   febuxostat 40 MG tablet Commonly known as:  ULORIC Take 40 mg by mouth every evening.   fenofibrate 48 MG tablet Commonly known as:  TRICOR Take 48 mg by mouth every evening.   ferrous sulfate 325 (65 FE) MG EC tablet Take 325 mg by mouth every evening.   furosemide 40 MG tablet Commonly known as:  LASIX Take 1 tablet (40 mg total) by mouth daily. What changed:  medication strength  how much to take   ICAPS AREDS FORMULA PO Take 2 capsules by mouth daily.   nitroGLYCERIN 0.4 MG SL tablet Commonly known as:  NITROSTAT Place 1 tablet (0.4 mg total) under the tongue every 5 (five) minutes as needed for chest pain.   pantoprazole 40 MG tablet Commonly known as:  PROTONIX Take 40 mg by mouth every evening.   potassium chloride SA 20 MEQ tablet Commonly known  as:  K-DUR,KLOR-CON Take 1 tablet (20 mEq total) by mouth daily. While taking lasix only   sertraline 50 MG tablet Commonly known as:  ZOLOFT Take 1 tablet (50 mg total) by mouth daily.   tiotropium 18 MCG inhalation capsule Commonly known as:  SPIRIVA Place 18 mcg into inhaler and inhale daily.   TOPROL XL 25 MG 24 hr tablet Generic drug:  metoprolol succinate Take 25 mg by mouth daily.         Outstanding Labs/Studies   BMET  Duration of Discharge Encounter   Greater than 30 minutes including physician time.  Signed, Angelena Form PA-C 04/01/2016, 12:08 PM  I have seen and examined this patient with Angelena Form.  Agree with above, note added to reflect my findings.  On exam, regular rhythm, no murmurs, lungs clear.  Had CRT-P placed along with AV nodal ablation.   Did well post procedure.  Plan was to discharge on Saturday but no BiPAP was available at the patient's SNF so held over the weekend.  Plan for discharge today on BiPAP at night.  Cristine Daw have her follow up in device clinic in 10 days.  Zaden Sako M. Petina Muraski MD 04/03/2016 9:41 AM

## 2016-04-02 DIAGNOSIS — I5043 Acute on chronic combined systolic (congestive) and diastolic (congestive) heart failure: Secondary | ICD-10-CM | POA: Diagnosis not present

## 2016-04-02 DIAGNOSIS — I130 Hypertensive heart and chronic kidney disease with heart failure and stage 1 through stage 4 chronic kidney disease, or unspecified chronic kidney disease: Secondary | ICD-10-CM | POA: Diagnosis not present

## 2016-04-02 LAB — BASIC METABOLIC PANEL
Anion gap: 10 (ref 5–15)
BUN: 62 mg/dL — AB (ref 6–20)
CHLORIDE: 97 mmol/L — AB (ref 101–111)
CO2: 36 mmol/L — ABNORMAL HIGH (ref 22–32)
CREATININE: 2.37 mg/dL — AB (ref 0.44–1.00)
Calcium: 8.7 mg/dL — ABNORMAL LOW (ref 8.9–10.3)
GFR calc Af Amer: 21 mL/min — ABNORMAL LOW (ref >60)
GFR, EST NON AFRICAN AMERICAN: 18 mL/min — AB (ref >60)
GLUCOSE: 109 mg/dL — AB (ref 65–99)
POTASSIUM: 3.3 mmol/L — AB (ref 3.5–5.1)
SODIUM: 143 mmol/L (ref 135–145)

## 2016-04-02 LAB — GLUCOSE, CAPILLARY
GLUCOSE-CAPILLARY: 122 mg/dL — AB (ref 65–99)
GLUCOSE-CAPILLARY: 160 mg/dL — AB (ref 65–99)
Glucose-Capillary: 139 mg/dL — ABNORMAL HIGH (ref 65–99)
Glucose-Capillary: 139 mg/dL — ABNORMAL HIGH (ref 65–99)

## 2016-04-02 MED ORDER — FUROSEMIDE 40 MG PO TABS
40.0000 mg | ORAL_TABLET | Freq: Every day | ORAL | Status: DC
  Administered 2016-04-02 – 2016-04-03 (×2): 40 mg via ORAL
  Filled 2016-04-02 (×2): qty 1

## 2016-04-02 MED ORDER — POTASSIUM CHLORIDE CRYS ER 20 MEQ PO TBCR
40.0000 meq | EXTENDED_RELEASE_TABLET | Freq: Once | ORAL | Status: AC
  Administered 2016-04-02: 40 meq via ORAL
  Filled 2016-04-02: qty 2

## 2016-04-02 NOTE — Progress Notes (Signed)
Physical Therapy Treatment Patient Details Name: Regina Burke MRN: [id number] DOB: [birth year] Today's Date: 04/02/2016       PT Comments    Pt making slow progress, ready for SNF level PT at d/c.  Follow Up Recommendations  SNF     Equipment Recommendations  None recommended by PT    Recommendations for Other Services       Precautions / Restrictions Precautions Precautions: ICD/Pacemaker Precaution Comments: avoid reaching overhead with left arm    Mobility  Bed Mobility Overal bed mobility: Needs Assistance Bed Mobility: Supine to Sit     Supine to sit: Min assist     General bed mobility comments: Min assist to support trunk to get to sitting EOB, mod assist to help lift both legs back up into bed to get to supine.   Transfers Overall transfer level: Needs assistance Equipment used: 1 person hand held assist Transfers: Sit to/from [facility] Sit to Stand: Min assist Stand pivot transfers: Min assist       General transfer comment: face to face guard, supportive assist to take pivotal steps around to chair  Ambulation/Gait                 Stairs            Wheelchair Mobility    Modified Rankin (Stroke Patients Only)       Balance     Sitting balance-Leahy Scale: Fair       Standing balance-Leahy Scale: Poor                      Cognition Arousal/Alertness: Awake/alert Behavior During Therapy: WFL for tasks assessed/performed Overall Cognitive Status: Within Functional Limits for tasks assessed                      Exercises      General Comments        Pertinent Vitals/Pain Pain Assessment: No/denies pain    Home Living                      Prior Function            PT Goals (current goals can now be found in the care plan section) Acute Rehab PT Goals PT Goal Formulation: With patient Time For Goal Achievement: 04/14/16 Potential to Achieve Goals: Good Progress  towards PT goals: Progressing toward goals    Frequency  Min 3X/week    PT Plan Current plan remains appropriate    Co-evaluation             End of Session           Time: 1210-1225 PT Time Calculation (min) (ACUTE ONLY): 15 min  Charges:  $Therapeutic Activity: 8-22 mins                    G Codes:  Functional Assessment Tool Used: assist level Functional Limitation: Mobility: Walking and moving around Mobility: Walking and Moving Around Current Status (B5102): At least 20 percent but less than 40 percent impaired, limited or restricted Mobility: Walking and Moving Around Goal Status [phone]): At least 1 percent but less than 20 percent impaired, limited or restricted   Herbie Drape 04/02/2016, 1:01 PM

## 2016-04-02 NOTE — Progress Notes (Signed)
    SUBJECTIVE: No chest pain or dyspnea CURRENT MEDICATIONS: . apixaban  2.5 mg Oral BID  . docusate sodium  100 mg Oral BID  . ferrous sulfate  325 mg Oral Q breakfast  . fluticasone furoate-vilanterol  1 puff Inhalation Daily  . furosemide  40 mg Oral Daily  . guaiFENesin  600 mg Oral BID  . insulin aspart  0-9 Units Subcutaneous TID WC  . metoprolol tartrate  50 mg Oral BID  . pantoprazole  40 mg Oral Daily  . sertraline  50 mg Oral Daily  . sodium chloride flush  10-40 mL Intravenous BID  . sodium chloride flush  3 mL Intravenous Q12H  . tiotropium  18 mcg Inhalation Daily      OBJECTIVE: Physical Exam: Vitals:   04/02/16 0404 04/02/16 0552 04/02/16 0813 04/02/16 0824  BP:  (!) 119/58    Pulse:  80    Resp:  18    Temp:  98.2 F (36.8 C)    TempSrc:  Oral    SpO2:  100% 99% 100%  Weight: 184 lb 3.2 oz (83.6 kg)       Intake/Output Summary (Last 24 hours) at 04/02/16 1251 Last data filed at 04/02/16 0900  Gross per 24 hour  Intake              723 ml  Output             1450 ml  Net             -727 ml    Telemetry reveals  AF, V paced  GEN- WD, WN NAD Head- normal Neck- supple  Lungs- Diminished BS LLL, pacemaker site with no hematoma Heart- RRR GI- soft, NT, ND Extremities- trace edema Neuro- grossly intact  LABS: Basic Metabolic Panel:  Recent Labs  04/01/16 1355 04/02/16 0455  NA 138 143  K 3.8 3.3*  CL 97* 97*  CO2 32 36*  GLUCOSE 107* 109*  BUN 67* 62*  CREATININE 2.56* 2.37*  CALCIUM 8.5* 8.7*   CBC:  Recent Labs  03/31/16 0512 04/01/16 1355  WBC 3.5* 3.9*  HGB 8.1* 8.0*  HCT 27.2* 26.6*  MCV 88.9 87.8  PLT 155 177     ASSESSMENT AND PLAN:   1.  Permanent atrial fibrillation AVN ablation and CRTP implant yesterday Continue Eliquis long term for CHADS2VASC of 6  2.  Acute on chronic combined systolic and diastolic heart failure Continue lasix at present dose and follow Plan in place for discharge to SNF   3.   CAD No recent ischemic symptoms  4.  Acute on chronic renal disease, stage IV  5. Hypokalemia Supplement DC in AM to SNF Kirk Ruths MD 04/02/2016 12:51 PM

## 2016-04-03 ENCOUNTER — Encounter (HOSPITAL_COMMUNITY): Payer: Self-pay | Admitting: *Deleted

## 2016-04-03 DIAGNOSIS — I481 Persistent atrial fibrillation: Secondary | ICD-10-CM

## 2016-04-03 DIAGNOSIS — I130 Hypertensive heart and chronic kidney disease with heart failure and stage 1 through stage 4 chronic kidney disease, or unspecified chronic kidney disease: Secondary | ICD-10-CM | POA: Diagnosis not present

## 2016-04-03 LAB — GLUCOSE, CAPILLARY
GLUCOSE-CAPILLARY: 150 mg/dL — AB (ref 65–99)
Glucose-Capillary: 143 mg/dL — ABNORMAL HIGH (ref 65–99)

## 2016-04-03 MED ORDER — ALPRAZOLAM 0.25 MG PO TABS: 0.2500 mg | ORAL_TABLET | Freq: Every evening | ORAL | 0 refills | Status: DC | PRN

## 2016-04-03 NOTE — Progress Notes (Signed)
SUBJECTIVE: The patient is doing well today.  At this time, she denies chest pain, shortness of breath, or any new concerns.  Looking forward to discharge today.  Marland Kitchen apixaban  2.5 mg Oral BID  . docusate sodium  100 mg Oral BID  . ferrous sulfate  325 mg Oral Q breakfast  . fluticasone furoate-vilanterol  1 puff Inhalation Daily  . furosemide  40 mg Oral Daily  . guaiFENesin  600 mg Oral BID  . insulin aspart  0-9 Units Subcutaneous TID WC  . metoprolol tartrate  50 mg Oral BID  . pantoprazole  40 mg Oral Daily  . sertraline  50 mg Oral Daily  . sodium chloride flush  10-40 mL Intravenous BID  . sodium chloride flush  3 mL Intravenous Q12H  . tiotropium  18 mcg Inhalation Daily      OBJECTIVE: Physical Exam: Vitals:   04/03/16 0843 04/03/16 0845 04/03/16 0926 04/03/16 1000  BP:   (!) 123/53   Pulse:   80   Resp:      Temp:      TempSrc:      SpO2: 99% 100%    Weight:      Height:    '5\' 4"'$  (1.626 m)    Intake/Output Summary (Last 24 hours) at 04/03/16 1039 Last data filed at 04/03/16 0925  Gross per 24 hour  Intake              716 ml  Output              400 ml  Net              316 ml    Telemetry reveals V pacing  GEN- The patient is well appearing, alert and oriented x 3 today.   Head- normocephalic, atraumatic Eyes-  Sclera clear, conjunctiva pink Ears- hearing intact Oropharynx- clear Neck- supple, no JVP Lungs- Clear to ausculation bilaterally, normal work of breathing Heart- Regular rate and rhythm, no significant murmurs, no rubs or gallops GI- soft, NT, ND Extremities- no clubbing, cyanosis, or edema Skin- no rash or lesion Psych- euthymic mood, full affect Neuro- no gross deficits appreciated  PPM site is stable no hematoma, no bleeding  LABS: Basic Metabolic Panel:  Recent Labs  04/01/16 1355 04/02/16 0455  NA 138 143  K 3.8 3.3*  CL 97* 97*  CO2 32 36*  GLUCOSE 107* 109*  BUN 67* 62*  CREATININE 2.56* 2.37*  CALCIUM 8.5* 8.7*     CBC:  Recent Labs  04/01/16 1355  WBC 3.9*  HGB 8.0*  HCT 26.6*  MCV 87.8  PLT 177    RADIOLOGY: Dg Chest 2 View Result Date: 04/01/2016 CLINICAL DATA:  S/P PACEMAKER INSERTION, PT UNABLE TO RAISE LEFT ARM FOR LATERAL PER MD ORDER EXAM: CHEST  2 VIEW COMPARISON:  03/27/2016 FINDINGS: Pacemaker overlies enlarged cardiac silhouette. There is bibasilar effusions and atelectasis not changed. Upper lungs are clear. There is scarring in the RIGHT upper lobe. IMPRESSION: 1. No significant change. 2. Cardiomegaly, bibasilar atelectasis and small effusions Electronically Signed   By: Suzy Bouchard M.D.   On: 04/01/2016 08:38    ASSESSMENT AND PLAN:   1.  Permanent atrial fibrillation AVN ablation and CRTP implant 03/31/16 Site is stable Continue Eliquis long term for CHADS2VASC of 6  2.  Acute on chronic combined systolic and diastolic heart failure Continue lasix at present dose and follow Plan in place for discharge to SNF  3.  CAD       No symptoms  4.  Acute on chronic renal disease, stage IV  5. Hypokalemia     Replaced, planned for out patient f/u/lab  Discharge held over the weekend only secondary to pending BIPAP (she uses chronically at home with sleep) equipment arrangements at SNF The patient was seen and examined today by Dr. Curt Bears and remains cleared and felt stable for discharge to SNF as planned without changes. D/w case management will f/u on her BIPAP arrangements, and ready for d/c otherwise  Tommye Standard, PA-C 04/03/2016 10:39 AM  I have seen and examined this patient with Tommye Standard.  Agree with above, note added to reflect my findings.  On exam, regular rhythm,no murmurs, lungs clear.  Had CRT-P and AVN ablation on Friday.  Could not be discharged as SNF did not have access to BiPAP. Plan for discharge today. Follow up in device clinic in 10 days.   Will M. Camnitz MD 04/03/2016 11:07 AM

## 2016-04-03 NOTE — Progress Notes (Signed)
Wrote Rx for the patient's Xanax 0.'25mg'$  one PO, PRN at HS #7 tab no refills for the SNF to continue management via SNF physicians and her primary physician out patient.   Tommye Standard, PA-C

## 2016-04-03 NOTE — Clinical Social Work Note (Signed)
Per MD patient ready to DC back to [facility] in [place], patient/family (Patient states her sister is notifying family), and facility notified of patient's DC. Regina Burke with facility confirms that the facility has the patient's bipap and is ready to admit her. RN given number for report. DC packet on patient's chart. Ambulance transport requested for patient. CSW signing off at this time.   Liz Beach MSW, [place], [place], [phone]

## 2016-04-03 NOTE — Progress Notes (Signed)
Rn called report to Maudie Mercury at [facility] nursing facility. Pt will be transported by ambulance. Family is aware Carlie Solorzano Rica Mote, RN

## 2016-04-04 ENCOUNTER — Telehealth: Payer: Self-pay | Admitting: Internal Medicine

## 2016-04-04 ENCOUNTER — Telehealth: Payer: Self-pay | Admitting: Cardiovascular Disease

## 2016-04-04 NOTE — Telephone Encounter (Signed)
Per note from Angelena Form patient was scheduled for fu with Christell Faith in [place] on 04-13-16.  Faxed Epic order to [facility] .  Spoke with Annette Stable at facility and she advised that patient can have labs drawn there.  Faxed orders with instructions to have done by tomorrow and if unable to drawn there to bring her to the medical mall at [place].

## 2016-04-04 NOTE — Telephone Encounter (Signed)
Ria Comment with [facility] faxed form over stating that [facility] has made multiple attempts to contact patient to arrange Split Night. [facility] has left multiple messages for pt to contact them back to schedule. So far patient has not contacted [facility] back to schedule.   I believe this is the patient that went home on BiPap and we need to get her in for Sleep Study to start process of obtaining her own BiPap.   Pt has a Hospital F/U with you on 04/28/16.    Just FYI for you.

## 2016-04-05 NOTE — Telephone Encounter (Signed)
Called and found that patient was at [facility] in [place], phone number [phone]. Called and left message for Ria Comment at [facility] to contact this number and ask for the unit director to arrange this appointment for her split night.  Nothing else needed at this time. Waiting on patient's appointment, so that I can document in message. Rhonda J Cobb

## 2016-04-05 NOTE — Telephone Encounter (Signed)
Appointment for Split Night has been scheduled for 05/10/16 per Ria Comment at [facility]. Nothing else needed at this time. Rhonda J Cobb

## 2016-04-05 NOTE — Telephone Encounter (Signed)
Called and spoke with Ria Comment at [facility] and provided the information where the patient was at and also the phone number and to ask for the unit director to get this study scheduled. Per Ria Comment she will call [facility] to arrange study. Will leave phone note open until I hear back from [place] at [facility]. Rhonda J Cobb

## 2016-04-06 ENCOUNTER — Encounter: Payer: Self-pay | Admitting: Family

## 2016-04-06 ENCOUNTER — Telehealth: Payer: Self-pay | Admitting: Internal Medicine

## 2016-04-06 ENCOUNTER — Ambulatory Visit: Payer: Medicare Other | Attending: Family | Admitting: Family

## 2016-04-06 VITALS — BP 128/62 | HR 80 | Resp 18 | Ht 64.0 in | Wt 184.0 lb

## 2016-04-06 DIAGNOSIS — Z8261 Family history of arthritis: Secondary | ICD-10-CM | POA: Insufficient documentation

## 2016-04-06 DIAGNOSIS — Z888 Allergy status to other drugs, medicaments and biological substances status: Secondary | ICD-10-CM | POA: Diagnosis not present

## 2016-04-06 DIAGNOSIS — N183 Chronic kidney disease, stage 3 (moderate): Secondary | ICD-10-CM | POA: Insufficient documentation

## 2016-04-06 DIAGNOSIS — Z833 Family history of diabetes mellitus: Secondary | ICD-10-CM | POA: Diagnosis not present

## 2016-04-06 DIAGNOSIS — Z9049 Acquired absence of other specified parts of digestive tract: Secondary | ICD-10-CM | POA: Diagnosis not present

## 2016-04-06 DIAGNOSIS — Z90710 Acquired absence of both cervix and uterus: Secondary | ICD-10-CM | POA: Diagnosis not present

## 2016-04-06 DIAGNOSIS — Z880 Allergy status to penicillin: Secondary | ICD-10-CM | POA: Insufficient documentation

## 2016-04-06 DIAGNOSIS — J449 Chronic obstructive pulmonary disease, unspecified: Secondary | ICD-10-CM

## 2016-04-06 DIAGNOSIS — J45909 Unspecified asthma, uncomplicated: Secondary | ICD-10-CM | POA: Diagnosis not present

## 2016-04-06 DIAGNOSIS — D649 Anemia, unspecified: Secondary | ICD-10-CM | POA: Diagnosis not present

## 2016-04-06 DIAGNOSIS — I130 Hypertensive heart and chronic kidney disease with heart failure and stage 1 through stage 4 chronic kidney disease, or unspecified chronic kidney disease: Secondary | ICD-10-CM | POA: Diagnosis present

## 2016-04-06 DIAGNOSIS — Z950 Presence of cardiac pacemaker: Secondary | ICD-10-CM | POA: Diagnosis not present

## 2016-04-06 DIAGNOSIS — M1990 Unspecified osteoarthritis, unspecified site: Secondary | ICD-10-CM | POA: Insufficient documentation

## 2016-04-06 DIAGNOSIS — Z9109 Other allergy status, other than to drugs and biological substances: Secondary | ICD-10-CM | POA: Diagnosis not present

## 2016-04-06 DIAGNOSIS — Z87891 Personal history of nicotine dependence: Secondary | ICD-10-CM | POA: Insufficient documentation

## 2016-04-06 DIAGNOSIS — Z79899 Other long term (current) drug therapy: Secondary | ICD-10-CM | POA: Diagnosis not present

## 2016-04-06 DIAGNOSIS — Z9841 Cataract extraction status, right eye: Secondary | ICD-10-CM | POA: Insufficient documentation

## 2016-04-06 DIAGNOSIS — E249 Cushing's syndrome, unspecified: Secondary | ICD-10-CM | POA: Insufficient documentation

## 2016-04-06 DIAGNOSIS — Z91040 Latex allergy status: Secondary | ICD-10-CM | POA: Insufficient documentation

## 2016-04-06 DIAGNOSIS — Z8249 Family history of ischemic heart disease and other diseases of the circulatory system: Secondary | ICD-10-CM | POA: Diagnosis not present

## 2016-04-06 DIAGNOSIS — I5032 Chronic diastolic (congestive) heart failure: Secondary | ICD-10-CM

## 2016-04-06 DIAGNOSIS — I10 Essential (primary) hypertension: Secondary | ICD-10-CM

## 2016-04-06 DIAGNOSIS — I509 Heart failure, unspecified: Secondary | ICD-10-CM | POA: Diagnosis not present

## 2016-04-06 DIAGNOSIS — I2510 Atherosclerotic heart disease of native coronary artery without angina pectoris: Secondary | ICD-10-CM | POA: Insufficient documentation

## 2016-04-06 DIAGNOSIS — Z9889 Other specified postprocedural states: Secondary | ICD-10-CM | POA: Diagnosis not present

## 2016-04-06 DIAGNOSIS — N289 Disorder of kidney and ureter, unspecified: Secondary | ICD-10-CM

## 2016-04-06 NOTE — Progress Notes (Signed)
Subjective:    Patient ID: Regina Burke, female    DOB: [birth year], 82 y.o.   MRN: [id number]  Congestive Heart Failure  Presents for initial visit. The disease course has been improving. Associated symptoms include edema and fatigue. Pertinent negatives include no abdominal pain, chest pain, orthopnea, palpitations or shortness of breath. The symptoms have been improving. Past treatments include beta blockers, oxygen and salt and fluid restriction. The treatment provided moderate relief. Compliance with prior treatments has been good. Her past medical history is significant for anemia, arrhythmia, chronic lung disease, DM and HTN. She has multiple 1st degree relatives with heart disease.  Hypertension  This is a chronic problem. The current episode started more than 1 year ago. The problem is controlled. Associated symptoms include peripheral edema. Pertinent negatives include no chest pain, neck pain, palpitations or shortness of breath. There are no associated agents to hypertension. Risk factors for coronary artery disease include diabetes mellitus, family history and post-menopausal state. Past treatments include beta blockers, diuretics and lifestyle changes. The current treatment provides moderate improvement. Hypertensive end-organ damage includes kidney disease and heart failure.   Past Medical History:  Diagnosis Date  . Anemia   . Arthritis   . Asthma   . Cardiac resynchronization therapy pacemaker (CRT-P) in place    a. 03/31/16:  Medtronic Percepta Quad CRT-P MRI SureScan (serial Number [id number]) device.  . Chronic diastolic CHF (congestive heart failure) ([place])    a. 10/2015 Echo: EF 55-65%, Gr1 DD, mild MR, mildly dil LA, nl RV fxn, nl PASP.  Marland Kitchen Chronic kidney disease (CKD), stage III (moderate)   . COPD (chronic obstructive pulmonary disease) ([place])   . Coronary artery disease    a. 11/2014 NSTEMI/PCI: LM nl, LAD 32m D1 30, LCX mild dzs, OM1 20p, OM2 262mOM3 90p (2.25x8  Promus Premier DES), RCA nl.   . Cough    CHRONIC AT NIGHT  . Cushing's disease ([facility]  . Depression   . Diverticulitis   . Edema    FEET/LEGS  . GERD (gastroesophageal reflux disease)   . Gout   . History of hiatal hernia   . History of pneumonia   . HLD (hyperlipidemia)   . HOH (hard of hearing)   . Hypertension   . Hypertensive heart disease   . Lung cancer (HCReidsvilledx'd 2014   S/P radiation 2015  . Migraine   . Multiple allergies   . PAF (paroxysmal atrial fibrillation) (H[facility])    a. on amio, Toprol XL, Cardizem CD, and Eliquis 2.5 mg bid (age & SCr); b. CHADS2VASc ==> 7 (CHF, HTN, age x 2, DM, vascular disease and sex category)  . S/P AV nodal ablation    a. on 03/31/16 for persistant afib with CRT-P placement  . Sleep apnea   . Type II diabetes mellitus ([place] [place]    Past Surgical History:  Procedure Laterality Date  . ABDOMINAL HYSTERECTOMY    . ADRENALECTOMY Left 1980's   "[place]"  . APPENDECTOMY    . BREAST CYST EXCISION Left   . CATARACT EXTRACTION W/PHACO Right 12/28/2015   Procedure: CATARACT EXTRACTION PHACO AND INTRAOCULAR LENS PLACEMENT (IOC);  Surgeon: WiBirder RobsonMD;  Location: [facility] ORS;  Service: Ophthalmology;  Laterality: Right;  [place]   . CHOLECYSTECTOMY    . CORONARY ANGIOPLASTY WITH STENT PLACEMENT  12/23/2014  . ELECTROPHYSIOLOGIC STUDY N/A 03/08/2016   Procedure: CARDIOVERSION;  Surgeon: AiWende BushyMD;  Location: [facility] ORS;  Service: Cardiovascular;  Laterality:  N/A;  . ELECTROPHYSIOLOGIC STUDY N/A 03/07/2016   Procedure: Cardioversion;  Surgeon: Wende Bushy, MD;  Location: [facility] ORS;  Service: Cardiovascular;  Laterality: N/A;  . ELECTROPHYSIOLOGIC STUDY N/A 03/31/2016   Procedure: AV Node Ablation;  Surgeon: Will Meredith Leeds, MD;  Location: [place] [facility];  Service: Cardiovascular;  Laterality: N/A;  . EP IMPLANTABLE DEVICE N/A 03/31/2016   Procedure: BiV Pacemaker Insertion CRT-P;  Surgeon: Will Meredith Leeds, MD;  Location: [place] [facility];  Service: Cardiovascular;  Laterality: N/A;  . FRACTURE SURGERY    . LEFT HEART CATHETERIZATION WITH CORONARY ANGIOGRAM N/A 12/23/2014   Procedure: LEFT HEART CATHETERIZATION WITH CORONARY ANGIOGRAM;  Surgeon: Burnell Blanks, MD;  [facility];  Service: Cardiovascular;  Laterality: N/A;  . PERCUTANEOUS CORONARY STENT INTERVENTION (PCI-S)  12/23/2014   Procedure: PERCUTANEOUS CORONARY STENT INTERVENTION (PCI-S);  Surgeon: Burnell Blanks, MD;  Location: [facility] CATH LAB;  Service: Cardiovascular;;  Promus 2.25x8  . TONSILLECTOMY    . TRANSTHORACIC ECHOCARDIOGRAM  11/26/2015   Technically difficult study. EF 55-60%. Normal wall motion. GR 1 DD.  . TUBAL LIGATION    . WRIST FRACTURE SURGERY Bilateral ~ 2000    Family History  Problem Relation Age of Onset  . Heart disease Mother   . Diabetes Mother   . Osteoarthritis Mother   . Hypertension Mother   . Heart disease Father   . Hypertension Father   . COPD Brother     Social History  Substance Use Topics  . Smoking status: Former Smoker    Packs/day: 1.00    Years: 45.00    Types: Cigarettes    Quit date: 04/25/1998  . Smokeless tobacco: Never Used  . Alcohol use No     Comment: 12/23/2014 "might have a couple mixed drinks/year"    Allergies  Allergen Reactions  . Ciprofloxacin Shortness Of Breath, Itching and Rash  . Doxycycline Shortness Of Breath, Itching and Rash  . Penicillins Shortness Of Breath, Itching, Rash and Other (See Comments)    Has patient had a PCN reaction causing immediate rash, facial/tongue/throat swelling, SOB or lightheadedness with hypotension: Yes Has patient had a PCN reaction causing severe rash involving mucus membranes or skin necrosis: No Has patient had a PCN reaction that required hospitalization No Has patient had a PCN reaction occurring within the last 10 years: No If all of the above answers are "NO", then may proceed with Cephalosporin use.  . Sulfa Antibiotics  Shortness Of Breath, Itching and Rash  . Latex Itching  . Morphine And Related Itching  . Albuterol Sulfate   . Cefuroxime Rash    Blisters in mouth    Prior to Admission medications   Medication Sig Start Date End Date Taking? Authorizing Provider  ALPRAZolam (XANAX) 0.25 MG tablet Take 1 tablet (0.25 mg total) by mouth at bedtime as needed for anxiety. 04/03/16  Yes Renee Dyane Dustman, PA-C  apixaban (ELIQUIS) 2.5 MG TABS tablet Take 1 tablet (2.5 mg total) by mouth every 12 (twelve) hours. 03/30/16  Yes Gladstone Lighter, MD  atorvastatin (LIPITOR) 20 MG tablet Take 1 tablet (20 mg total) by mouth every evening. 12/03/15  Yes Minna Merritts, MD  cholecalciferol (VITAMIN D) 400 UNITS TABS tablet Take 400 Units by mouth every evening.    Yes Historical Provider, MD  cyanocobalamin 500 MCG tablet Take 500 mcg by mouth every evening.   Yes Historical Provider, MD  febuxostat (ULORIC) 40 MG tablet Take 40 mg by mouth  every evening.    Yes Historical Provider, MD  fenofibrate (TRICOR) 48 MG tablet Take 48 mg by mouth every evening.    Yes Historical Provider, MD  ferrous sulfate 325 (65 FE) MG EC tablet Take 325 mg by mouth every evening.    Yes Historical Provider, MD  fluticasone furoate-vilanterol (BREO ELLIPTA) 100-25 MCG/INH AEPB Inhale 1 puff into the lungs daily.   Yes Historical Provider, MD  furosemide (LASIX) 40 MG tablet Take 1 tablet (40 mg total) by mouth daily. 04/01/16  Yes Eileen Stanford, PA-C  metoprolol succinate (TOPROL XL) 25 MG 24 hr tablet Take 25 mg by mouth daily.  03/03/16  Yes Minna Merritts, MD  Multiple Vitamins-Minerals (ICAPS AREDS FORMULA PO) Take 2 capsules by mouth daily.   Yes Historical Provider, MD  nitroGLYCERIN (NITROSTAT) 0.4 MG SL tablet Place 1 tablet (0.4 mg total) under the tongue every 5 (five) minutes as needed for chest pain. 12/24/14  Yes Rhonda G Barrett, PA-C  pantoprazole (PROTONIX) 40 MG tablet Take 40 mg by mouth every evening.    Yes Historical  Provider, MD  potassium chloride SA (K-DUR,KLOR-CON) 20 MEQ tablet Take 1 tablet (20 mEq total) by mouth daily. While taking lasix only 03/24/16  Yes Gladstone Lighter, MD  sertraline (ZOLOFT) 50 MG tablet Take 1 tablet (50 mg total) by mouth daily. 03/24/16  Yes Gladstone Lighter, MD  tiotropium (SPIRIVA) 18 MCG inhalation capsule Place 18 mcg into inhaler and inhale daily.   Yes Historical Provider, MD      Review of Systems  Constitutional: Positive for fatigue. Negative for appetite change.  HENT: Positive for rhinorrhea. Negative for congestion and sore throat.   Eyes: Positive for visual disturbance (blurry vision). Negative for pain.  Respiratory: Negative for cough, chest tightness and shortness of breath.   Cardiovascular: Negative for chest pain and palpitations.  Gastrointestinal: Negative for abdominal distention and abdominal pain.  Endocrine: Negative.   Genitourinary: Negative.   Musculoskeletal: Negative for back pain and neck pain.  Skin: Negative.   Allergic/Immunologic: Negative.   Neurological: Negative for dizziness and light-headedness.  Hematological: Negative for adenopathy. Does not bruise/bleed easily.  Psychiatric/Behavioral: Negative for dysphoric mood and sleep disturbance (wearing bipap and oxygen at 2L). The patient is nervous/anxious.        Objective:   Physical Exam  Constitutional: She is oriented to person, place, and time. She appears well-developed and well-nourished.  HENT:  Head: Normocephalic and atraumatic.  Eyes: Conjunctivae are normal. Pupils are equal, round, and reactive to light.  Neck: Normal range of motion. Neck supple.  Cardiovascular: Normal rate and regular rhythm.   Pulmonary/Chest: Effort normal. She has no wheezes. She has no rales.  Abdominal: Soft. She exhibits no distension. There is no tenderness.  Musculoskeletal: She exhibits edema (1+ pedal edema in bilaterl lower legs). She exhibits no tenderness.  Neurological: She  is alert and oriented to person, place, and time.  Skin: Skin is warm and dry.  Psychiatric: She has a normal mood and affect. Her behavior is normal. Thought content normal.  Nursing note and vitals reviewed.   BP 128/62   Pulse 80   Resp 18   Ht '5\' 4"'$  (1.626 m)   Wt 184 lb (83.5 kg)   SpO2 98% Comment: on 2L  BMI 31.58 kg/m        Assessment & Plan:  1: Chronic heart failure with preserved ejection fraction- Patient presents with fatigue upon minimal exertion (Class III). She denies  any shortness of breath. She does have some swelling in her feet and does try to elevate them when she's sitting for long periods of time. She feels like the swelling may be a little worse and she was encouraged to speak with nephrology tomorrow regarding possible dose adjustment of diuretic. She says that she's getting weighed at the facility but she's not sure if it's being done daily. Son, who is present, says that he will ask the facility ([facility]) if it's being done and what the weights have been. Discussed the importance of weighing daily and calling for an overnight weight gain of >2 pounds or a weekly weight gain of >5 pounds. She is not adding salt to her food at the facility. Did review the importance of following a '2000mg'$  sodium diet as well as how to read food labels. Written dietary information was also given to her. Physical therapy at the facility has recently started working with her.  2: HTN- Blood pressure looks good today. 3: Renal insufficiency- Has an appointment with Dr. Abigail Butts tomorrow. Referral made to [facility] for nutrition counseling. Has a history of diabetes but is currently not being treated. 4: COPD- Wearing oxygent at 2L around the clock at this time. Also wearing bipap 4-5 hours nightly which she feels like helps her quite a bit. She is concerned about how to obtain bipap equipment when she goes home as this current bipap machine is the facility's. Encouraged son to  call her pulmonologist for advise. Scheduled for a sleep study early September 2017. When she got to the office, her oxygen tank wasn't turned on and patient says that she was feeling dizzy. Tank was turned on and patient felt better within a couple of minutes. Son says that he will speak with transport staff to make sure they know how to turn the oxygen on.  Medication list from the facility was reviewed.  Return here in 1 month or sooner for any questions/problems before then.

## 2016-04-06 NOTE — Patient Instructions (Signed)
Continue weighing daily and call for an overnight weight gain of > 2 pounds or a weekly weight gain of >5 pounds. 

## 2016-04-06 NOTE — Telephone Encounter (Signed)
Pt son called, stated pt is in [facility] in [place], and pt states her BiPAP settings are not right. Please call, pt son would like to know what the correct settings should be, so he can let [facility] know. Please call.

## 2016-04-06 NOTE — Telephone Encounter (Signed)
She was placed on very low settings just to get her used to the machine. We will know more about what settings she should be on after the sleep study.

## 2016-04-06 NOTE — Telephone Encounter (Signed)
Son is saying pt's BiPAP at her facility is not on the correct settings. I can't see anything in chart on her admission that states what she is set on. Pt is scheduled for sleep study on 05/10/16. Please advise.

## 2016-04-07 NOTE — Telephone Encounter (Signed)
LMOM for pt's son to return call.

## 2016-04-07 NOTE — Telephone Encounter (Signed)
Son informed. States pt will be leaving the facility in about a week. I informed facility can contact [facility] to get pt set up on loaner BiPAP until we get sleep study results. Nothing further needed.

## 2016-04-11 ENCOUNTER — Encounter: Payer: Self-pay | Admitting: Internal Medicine

## 2016-04-11 ENCOUNTER — Ambulatory Visit (INDEPENDENT_AMBULATORY_CARE_PROVIDER_SITE_OTHER): Payer: Medicare Other | Admitting: *Deleted

## 2016-04-11 DIAGNOSIS — I5032 Chronic diastolic (congestive) heart failure: Secondary | ICD-10-CM | POA: Diagnosis not present

## 2016-04-11 DIAGNOSIS — Z950 Presence of cardiac pacemaker: Secondary | ICD-10-CM | POA: Diagnosis not present

## 2016-04-11 LAB — CUP PACEART INCLINIC DEVICE CHECK
Date Time Interrogation Session: 20170815151958
Implantable Lead Implant Date: 20170804
Implantable Lead Model: 5076
Lead Channel Pacing Threshold Pulse Width: 0.4 ms
MDC IDC LEAD IMPLANT DT: 20170804
MDC IDC LEAD LOCATION: 753858
MDC IDC LEAD LOCATION: 753860
MDC IDC MSMT LEADCHNL RV PACING THRESHOLD AMPLITUDE: 1.25 V
MDC IDC SET LEADCHNL RV PACING AMPLITUDE: 3.5 V
MDC IDC SET LEADCHNL RV PACING PULSEWIDTH: 0.4 ms

## 2016-04-11 NOTE — Patient Instructions (Addendum)
Call the [facility] at [phone] if you develop any signs/symptoms of infection at your pacemaker site (fever, chills, swelling, drainage, etc.).  Your physician wants you to follow-up on 07/04/2016 at 11:00am with Dr. Caryl Comes in [place].

## 2016-04-11 NOTE — Progress Notes (Signed)
Wound check appointment. Steri-strips removed. Wound without redness or edema. Incision edges approximated, wound well healed. Normal device function. Thresholds, sensing, and impedances consistent with implant measurements. Device programmed at 3.5V/auto capture programmed on for extra safety margin until 3 month visit. Histogram distribution appropriate for patient and level of activity. BiVp 100% (effective 100%). Lower rate decreased to 70bpm per SK (s/p AVN ablation). No high ventricular rates noted. Patient educated about wound care, arm mobility, lifting restrictions. ROV in 3 months with SK/B.

## 2016-04-12 ENCOUNTER — Encounter: Payer: Self-pay | Admitting: Physician Assistant

## 2016-04-12 ENCOUNTER — Telehealth: Payer: Self-pay | Admitting: Internal Medicine

## 2016-04-12 NOTE — Telephone Encounter (Signed)
Pt's son has called stating pt will be going home on Friday from assisted living facility and pt needs BiPAP for home until she gets her sleep study. Please see if [facility] can help with this and call son back in regards to it.  Thanks

## 2016-04-12 NOTE — Telephone Encounter (Signed)
Called and spoke with Regina Burke with [facility] and she stated that Regina Burke was already working on this issue, but to have the son to contact [facility] at [phone] ext [phone] and ask for Regina Burke.  Called and spoke with patient's son, Regina Burke and provided this information to him and he stated that he would give them a call to try to expedite this request. Advised son that I would leave this phone message opened until tomorrow, in case he had any other additional questions. Son thanked me for my time and assistance. Advised son to contact me back with any questions or issues at [phone]. Rhonda J Cobb

## 2016-04-12 NOTE — Telephone Encounter (Signed)
Pt son calling stating that patient is leaving assistant living Friday and patient was told while in hospital that she can't be without Bpap machine  They would need one for home  Please advise and let pt son know as soon as we can

## 2016-04-13 ENCOUNTER — Encounter: Payer: Self-pay | Admitting: Physician Assistant

## 2016-04-13 ENCOUNTER — Ambulatory Visit (INDEPENDENT_AMBULATORY_CARE_PROVIDER_SITE_OTHER): Payer: Medicare Other | Admitting: Physician Assistant

## 2016-04-13 VITALS — BP 116/64 | HR 71 | Ht 64.0 in | Wt 177.2 lb

## 2016-04-13 DIAGNOSIS — I4819 Other persistent atrial fibrillation: Secondary | ICD-10-CM

## 2016-04-13 DIAGNOSIS — I481 Persistent atrial fibrillation: Secondary | ICD-10-CM

## 2016-04-13 DIAGNOSIS — Z9889 Other specified postprocedural states: Secondary | ICD-10-CM

## 2016-04-13 DIAGNOSIS — I2510 Atherosclerotic heart disease of native coronary artery without angina pectoris: Secondary | ICD-10-CM

## 2016-04-13 DIAGNOSIS — J9611 Chronic respiratory failure with hypoxia: Secondary | ICD-10-CM

## 2016-04-13 DIAGNOSIS — I5042 Chronic combined systolic (congestive) and diastolic (congestive) heart failure: Secondary | ICD-10-CM

## 2016-04-13 DIAGNOSIS — N184 Chronic kidney disease, stage 4 (severe): Secondary | ICD-10-CM

## 2016-04-13 NOTE — Progress Notes (Signed)
Cardiology Office Note Date:  04/13/2016  Patient ID:  Regina, Burke October 11, 1932, MRN [id number] PCP:  Tracie Harrier, MD  Cardiologist:  Dr. Rockey Situ, MD    Chief Complaint: Hospital follow up  History of Present Illness: Regina Burke is a 82 y.o. female with history of CAD s/p PCI to OM branch (11/2014), persistent difficult control Afib with RVR s/p recent prior DCCV which did not hold, anemia which has led to prior intermittnet usage of anticoagulation, chronic combined CHF, chronic respiratory failure 2/2 COPD, lung cancer s/p radiation, DM2, asthma, CKD stage III, cushing's disease, hypertensive heart disease, digoxin toxicity in the setting of renal failure, LBBB, HLD, and OSA who presents for hospital follow up after recent admission to [facility] for acute on chronic respiratory failure and transfer to [facility] from [facility] on 03/30/16 for AVN ablation and CRT-P.    She had been admitted to [facility] x 2 recently for acute on chronic respiratory failure, volume overload and difficult to control persistent Afib with RVR.    She was admitted in 11/2014 for chest pain and had PCI to OM branch. Prior EF from 10/2015 was 55-60% despite having frequent episodes of Afib and her CAD. She has previously been maintained on metoprolol, cardizem, and amiodarone. However, as of late her rate has been difficult to control leading to volume overload. She was recently admitted in early July for CHF exacerbation with persistent Afib with RVR s/p DCCV that was successful for less than 2 hours with difficulty noted coming out of sedation. EF was not assessed. She was seen by Dr. Caryl Comes, MD who discussed AV node ablation with PPM implantation. She was discharged before this could be done.    She returned to [facility] in late July with recurrent SOB, volume overload, and persistent Afib with RVR. She required diuresis with Lasix gtt given renal disease as well as BiPAP. Echo from admission on 7/22 showed a newly depressed EF  of 35-40%, diffuse HK, mild MR, LA moderately dilated, PASP 73 mm Hg. After she was reasonably well rate controlled a repeat echo was done on 7/28 that showed EF 35-40%, LA severely dilated, moderately increased PASP, left pleural effusion. Recheck echo on 8/1 showed an EF of 30-35%, moderate MR, LA moderately dilated, PASP could not be estimated. She was seen by Dr. Caryl Comes again with plans to proceed with AV node ablation with implantation of BiV-ICD at [facility] on 8/4 given her symptoms and continued depressed EF on echo, even with rate control. She was transferred to [facility] and underwent successful AVN ablation and MDT CRT-P implant on 03/31/16 and a Medtronic Percepta Quad CRT-P MRI SureScan (serial Number [id number]) device. She was continued on Eliquis given her CHADS2VASc of 6 and advised to continue this long term. Discharge weight was 198 pounds s/p good diuresis.   Weight down to 177 today (184 on 8/10 at the CHF Clinic). Renal function from 8/9 improved to 1.9 from 2.3 at time of discharge on 8/6. Potassium remained low at 3.4.   She comes in today doing well. LE swelling that was noted at her CHF clinic visit has improved since renal placed her on Lasix 40 mg daily. SOB stable. Her son is working with pulmonary to get BiPAP established for at home use. He would like to go back to Xarelto if possible as Eliquis is not affordable. No concerns with her device.    Past Medical History:  Diagnosis Date  . Anemia   . Arthritis   .  Asthma   . Cardiac resynchronization therapy pacemaker (CRT-P) in place    a. 03/31/16:  Medtronic Percepta Quad CRT-P MRI SureScan (serial Number [id number]) device.  . Chronic diastolic CHF (congestive heart failure) ([place])    a. 10/2015 Echo: EF 55-65%, Gr1 DD, mild MR, mildly dil LA, nl RV fxn, nl PASP.  Marland Kitchen Chronic kidney disease (CKD), stage III (moderate)   . COPD (chronic obstructive pulmonary disease) ([place])   . Coronary artery disease    a. 11/2014 NSTEMI/PCI: LM nl, LAD  30m D1 30, LCX mild dzs, OM1 20p, OM2 275mOM3 90p (2.25x8 Promus Premier DES), RCA nl.   . Cough    CHRONIC AT NIGHT  . Cushing's disease ([place]  . Depression   . Diverticulitis   . Edema    FEET/LEGS  . GERD (gastroesophageal reflux disease)   . Gout   . History of hiatal hernia   . History of pneumonia   . HLD (hyperlipidemia)   . HOH (hard of hearing)   . Hypertension   . Hypertensive heart disease   . Lung cancer (HCEdesvilledx'd 2014   S/P radiation 2015  . Migraine   . Multiple allergies   . Persistent atrial fibrillation (HCC)    a. on amio, Toprol XL, Cardizem CD, and Eliquis 2.5 mg bid (age & SCr); b. CHADS2VASc ==> 7 (CHF, HTN, age x 2, DM, vascular disease and sex category)  . S/P AV nodal ablation    a. on 03/31/16 for persistant afib with CRT-P placement  . Sleep apnea   . Type II diabetes mellitus ([place]    Past Surgical History:  Procedure Laterality Date  . ABDOMINAL HYSTERECTOMY    . ABLATION     July 2017  . ADRENALECTOMY Left 1980's   "[place]"  . APPENDECTOMY    . BREAST CYST EXCISION Left   . CATARACT EXTRACTION W/PHACO Right 12/28/2015   Procedure: CATARACT EXTRACTION PHACO AND INTRAOCULAR LENS PLACEMENT (IOC);  Surgeon: WiBirder RobsonMD;  Location: [facility] ORS;  Service: Ophthalmology;  Laterality: Right;  [place]   . CHOLECYSTECTOMY    . CORONARY ANGIOPLASTY WITH STENT PLACEMENT  12/23/2014  . ELECTROPHYSIOLOGIC STUDY N/A 03/08/2016   Procedure: CARDIOVERSION;  Surgeon: AiWende BushyMD;  Location: [facility] ORS;  Service: Cardiovascular;  Laterality: N/A;  . ELECTROPHYSIOLOGIC STUDY N/A 03/07/2016   Procedure: Cardioversion;  Surgeon: AiWende BushyMD;  Location: [facility] ORS;  Service: Cardiovascular;  Laterality: N/A;  . ELECTROPHYSIOLOGIC STUDY N/A 03/31/2016   Procedure: AV Node Ablation;  Surgeon: Will MaMeredith LeedsMD;  [facility];  Service: Cardiovascular;  Laterality: N/A;  . EP IMPLANTABLE DEVICE N/A 03/31/2016   Procedure: BiV  Pacemaker Insertion CRT-P;  Surgeon: Will MaMeredith LeedsMD;  [facility];  Service: Cardiovascular;  Laterality: N/A;  . FRACTURE SURGERY    . INSERT / REPLACE / REMOVE PACEMAKER  02/2016  . LEFT HEART CATHETERIZATION WITH CORONARY ANGIOGRAM N/A 12/23/2014   Procedure: LEFT HEART CATHETERIZATION WITH CORONARY ANGIOGRAM;  Surgeon: ChBurnell BlanksMD;  [facility];  Service: Cardiovascular;  Laterality: N/A;  . PERCUTANEOUS CORONARY STENT INTERVENTION (PCI-S)  12/23/2014   Procedure: PERCUTANEOUS CORONARY STENT INTERVENTION (PCI-S);  Surgeon: ChBurnell BlanksMD;  [facility];  Service: Cardiovascular;;  Promus 2.25x8  . TONSILLECTOMY    . TRANSTHORACIC ECHOCARDIOGRAM  11/26/2015   Technically difficult study. EF 55-60%. Normal wall motion. GR 1 DD.  . TUBAL LIGATION    .  WRIST FRACTURE SURGERY Bilateral ~ 2000    Current Outpatient Prescriptions  Medication Sig Dispense Refill  . ALPRAZolam (XANAX) 0.25 MG tablet Take 1 tablet (0.25 mg total) by mouth at bedtime as needed for anxiety. 7 tablet 0  . apixaban (ELIQUIS) 2.5 MG TABS tablet Take 1 tablet (2.5 mg total) by mouth every 12 (twelve) hours. 60 tablet 0  . atorvastatin (LIPITOR) 20 MG tablet Take 1 tablet (20 mg total) by mouth every evening. 90 tablet 3  . cholecalciferol (VITAMIN D) 400 UNITS TABS tablet Take 400 Units by mouth every evening.     . cyanocobalamin 500 MCG tablet Take 500 mcg by mouth every evening.    . febuxostat (ULORIC) 40 MG tablet Take 40 mg by mouth every evening.     . fenofibrate (TRICOR) 48 MG tablet Take 48 mg by mouth every evening.     . ferrous sulfate 325 (65 FE) MG EC tablet Take 325 mg by mouth every evening.     . fluticasone furoate-vilanterol (BREO ELLIPTA) 100-25 MCG/INH AEPB Inhale 1 puff into the lungs daily.    . furosemide (LASIX) 40 MG tablet Take 1 tablet (40 mg total) by mouth daily. 30 tablet 6  . metoprolol succinate (TOPROL XL) 25 MG 24  hr tablet Take 25 mg by mouth daily.  60 tablet 6  . Multiple Vitamins-Minerals (ICAPS AREDS FORMULA PO) Take 2 capsules by mouth daily.    . nitroGLYCERIN (NITROSTAT) 0.4 MG SL tablet Place 1 tablet (0.4 mg total) under the tongue every 5 (five) minutes as needed for chest pain. 25 tablet 3  . pantoprazole (PROTONIX) 40 MG tablet Take 40 mg by mouth every evening.     . potassium chloride SA (K-DUR,KLOR-CON) 20 MEQ tablet Take 1 tablet (20 mEq total) by mouth daily. While taking lasix only 30 tablet 1  . sertraline (ZOLOFT) 50 MG tablet Take 1 tablet (50 mg total) by mouth daily. 30 tablet 2  . tiotropium (SPIRIVA) 18 MCG inhalation capsule Place 18 mcg into inhaler and inhale daily.     No current facility-administered medications for this visit.     Allergies:   Ciprofloxacin; Doxycycline; Penicillins; Sulfa antibiotics; Latex; Morphine and related; Albuterol sulfate; and Cefuroxime   Social History:  The patient  reports that she quit smoking about 17 years ago. Her smoking use included Cigarettes. She has a 45.00 pack-year smoking history. She has never used smokeless tobacco. She reports that she does not drink alcohol or use drugs.   Family History:  The patient's family history includes COPD in her brother; Diabetes in her mother; Heart disease in her father and mother; Hypertension in her father and mother; Osteoarthritis in her mother.  ROS:   Review of Systems  Constitutional: Positive for malaise/fatigue. Negative for chills, diaphoresis, fever and weight loss.  HENT: Negative for congestion.   Eyes: Negative for discharge and redness.  Respiratory: Negative for cough, sputum production, shortness of breath and wheezing.   Cardiovascular: Positive for leg swelling. Negative for chest pain, palpitations, orthopnea, claudication and PND.       LE swelling improved  Gastrointestinal: Negative for abdominal pain, heartburn, nausea and vomiting.  Musculoskeletal: Negative for falls  and myalgias.  Skin: Negative for rash.  Neurological: Positive for weakness. Negative for dizziness, tingling, tremors, sensory change, speech change, focal weakness and loss of consciousness.  Endo/Heme/Allergies: Does not bruise/bleed easily.  Psychiatric/Behavioral: Negative for substance abuse. The patient is not nervous/anxious.   All other systems  reviewed and are negative.    PHYSICAL EXAM:  VS:  Ht '5\' 4"'$  (1.626 m)   Wt 177 lb 4 oz (80.4 kg)   BMI 30.42 kg/m  BMI: Body mass index is 30.42 kg/m.  Physical Exam  Constitutional: She is oriented to person, place, and time. She appears well-developed and well-nourished.  HENT:  Head: Normocephalic and atraumatic.  Eyes: Right eye exhibits no discharge. Left eye exhibits no discharge.  Neck: Normal range of motion. No JVD present.  Cardiovascular: Normal rate, regular rhythm, S1 normal and S2 normal.  Exam reveals no distant heart sounds, no friction rub, no midsystolic click and no opening snap.   Murmur heard. High-pitched blowing holosystolic murmur is present with a grade of 2/6  at the apex Pulmonary/Chest: Effort normal and breath sounds normal. No respiratory distress. She has no decreased breath sounds. She has no wheezes. She has no rales. She exhibits no tenderness.  Abdominal: Soft. She exhibits no distension. There is no tenderness.  Musculoskeletal: She exhibits edema.  Trace pre-tibial edema  Neurological: She is alert and oriented to person, place, and time.  Skin: Skin is warm and dry. No cyanosis. Nails show no clubbing.  Psychiatric: She has a normal mood and affect. Her speech is normal and behavior is normal. Judgment and thought content normal.     EKG:  Was ordered and interpreted by me today. Shows V paced, 71 bpm  Recent Labs: 06/07/2015: TSH 1.316 03/17/2016: B Natriuretic Peptide 548.0; Magnesium 2.1 03/18/2016: ALT 69 04/01/2016: Hemoglobin 8.0; Platelets 177 04/02/2016: BUN 62; Creatinine, Ser 2.37;  Potassium 3.3; Sodium 143  No results found for requested labs within last 8760 hours.   Estimated Creatinine Clearance: 19.4 mL/min (by C-G formula based on SCr of 2.37 mg/dL).   Wt Readings from Last 3 Encounters:  04/13/16 177 lb 4 oz (80.4 kg)  04/06/16 184 lb (83.5 kg)  04/03/16 184 lb 8 oz (83.7 kg)     Other studies reviewed: Additional studies/records reviewed today include: summarized above  ASSESSMENT AND PLAN:  1. Persistent Afib: Status post AVN ablation and CRTP implant 03/31/2016. Stable. Device appears to be functioning well on 12-lead. Wound care discussed. Eliquis at this time, though son would like to go back on Xarelto if possible. Per EP.  2. Chronic combined CHF: She does not appear to be volume overloaded at this time. Trace pre-tibial edema which is improved she renal increased her Lasix from 20 mg daily to 40 mg daily. Order compression stockings. Continue Toprol XL. Not on CEi/ARB due to CKD.   3. CAD as above: No symptoms concerning for angina. Continue current therapy.   4. CKD stage IV: Renal function continued to improve at outpatient lab check on 8/9 with a SCr of 1.9 (discharge SCr 2.37 on 8/6). Check Bmet. Labs no on file from recent renal visit.    5. Hypokalemia: Potassium remained low at lab check on 8/9 at 3.4. Check Bmet.   6. Chronic respiratory failure: Stable. On oxygen via  during the day and BiPAP 4-5 hours at nighttime. She is working with pulmonary to get BiPAP for home.   7. Anemia: Check CBC. Defer further management to primary.   Disposition: F/u with Dr. Rockey Situ in 1 month   Current medicines are reviewed at length with the patient today.  The patient did not have any concerns regarding medicines.  Melvern Banker PA-C 04/13/2016 1:39 PM     [facility] [address]  [place], [place] [address] [phone]

## 2016-04-13 NOTE — Patient Instructions (Addendum)
Medication Instructions:  Your physician recommends that you continue on your current medications as directed. Please refer to the Current Medication list given to you today.   Labwork: BMET, CBC today  Testing/Procedures: none  Follow-Up: Your physician recommends that you schedule a follow-up appointment in: one month with Dr. Rockey Situ.    Any Other Special Instructions Will Be Listed Below (If Applicable). Start wearing compression stockings daily. You should take them off for one hour each day.      If you need a refill on your cardiac medications before your next appointment, please call your pharmacy.

## 2016-04-14 LAB — CBC
HEMOGLOBIN: 9.6 g/dL — AB (ref 11.1–15.9)
Hematocrit: 29.7 % — ABNORMAL LOW (ref 34.0–46.6)
MCH: 26.2 pg — ABNORMAL LOW (ref 26.6–33.0)
MCHC: 32.3 g/dL (ref 31.5–35.7)
MCV: 81 fL (ref 79–97)
PLATELETS: 177 10*3/uL (ref 150–379)
RBC: 3.66 x10E6/uL — AB (ref 3.77–5.28)
RDW: 17.7 % — ABNORMAL HIGH (ref 12.3–15.4)
WBC: 5 10*3/uL (ref 3.4–10.8)

## 2016-04-14 LAB — BASIC METABOLIC PANEL
BUN/Creatinine Ratio: 17 (ref 12–28)
BUN: 38 mg/dL — ABNORMAL HIGH (ref 8–27)
CALCIUM: 9 mg/dL (ref 8.7–10.3)
CHLORIDE: 96 mmol/L (ref 96–106)
CO2: 27 mmol/L (ref 18–29)
Creatinine, Ser: 2.2 mg/dL — ABNORMAL HIGH (ref 0.57–1.00)
GFR calc Af Amer: 24 mL/min/{1.73_m2} — ABNORMAL LOW (ref >59)
GFR, EST NON AFRICAN AMERICAN: 21 mL/min/{1.73_m2} — AB (ref >59)
GLUCOSE: 105 mg/dL — AB (ref 65–99)
POTASSIUM: 4.3 mmol/L (ref 3.5–5.2)
SODIUM: 142 mmol/L (ref 134–144)

## 2016-04-14 NOTE — Telephone Encounter (Signed)
Called and spoke with pt's son, who does state that he spoke with [facility] and got this BiPap arranged. However, pt's son states that pt is telling him that she will not need to go home on the BiPap b/c she hasn't used it in the facility for 2 nights and has done just fine. Pt also states that she does not feel like she needs  the Sleep Study b/c the physician during her hospital stay  placed her on a CPAP and it was documented in the [place] notes that pt could not tolerate CPAP and placed on BiPap, therefore, she does not need a Sleep Study.  Explained to son that pt will need the Sleep Study due to the fact that she has been non compliant on BiPap and in order for her insurance to pay for a BiPap that is the only way she will be able  to get a BiPap unless she self pays for device.   Son states that pt is refusing to go home on BiPap that she has not been using BiPap in facility for the last two nights because the nurse there did not know how to place the BiPap mask on the patient.   Bottom line, pt is being D/C today and going home without BiPap per pt's son. Pt does not feel like she needs it b/c she has been without it at the facility for 2 nights. Pt will need sleep study as scheduled to proceed with obtaining a BiPap at home. Pt has an appointment scheduled for 04/18/16 and would like to discuss this with physician at that time.    I advised the son to try to get the patient to agree to proceed with previous plan to go home on Bipap, but son states that "patient stated that she doesn't need it and is not going to do it".   Forwarded this note to Dr. Mortimer Fries. Rhonda J Cobb

## 2016-04-14 NOTE — Anesthesia Postprocedure Evaluation (Signed)
Anesthesia Post Note  Patient: Regina Burke  Procedure(s) Performed: Procedure(s) (LRB): BiV Pacemaker Insertion CRT-P (N/A) AV Node Ablation (N/A)  Patient location during evaluation: PACU Anesthesia Type: General Level of consciousness: awake and alert Pain management: pain level controlled Vital Signs Assessment: post-procedure vital signs reviewed and stable Respiratory status: spontaneous breathing, nonlabored ventilation, respiratory function stable and patient connected to nasal cannula oxygen Cardiovascular status: blood pressure returned to baseline and stable Postop Assessment: no signs of nausea or vomiting Anesthetic complications: no    Last Vitals:  Vitals:   04/03/16 0926 04/03/16 0930  BP: (!) 123/53 (!) 123/53  Pulse: 80   Resp:    Temp:      Last Pain:  Vitals:   04/03/16 0936  TempSrc:   PainSc: 0-No pain                 Japhet Morgenthaler,JAMES TERRILL

## 2016-04-17 ENCOUNTER — Inpatient Hospital Stay: Payer: Medicare Other | Admitting: Internal Medicine

## 2016-04-18 ENCOUNTER — Ambulatory Visit (INDEPENDENT_AMBULATORY_CARE_PROVIDER_SITE_OTHER): Payer: Medicare Other | Admitting: Internal Medicine

## 2016-04-18 ENCOUNTER — Encounter: Payer: Self-pay | Admitting: Internal Medicine

## 2016-04-18 ENCOUNTER — Telehealth: Payer: Self-pay | Admitting: Cardiovascular Disease

## 2016-04-18 VITALS — BP 124/76 | HR 73 | Ht 64.0 in | Wt 177.2 lb

## 2016-04-18 DIAGNOSIS — J449 Chronic obstructive pulmonary disease, unspecified: Secondary | ICD-10-CM | POA: Diagnosis not present

## 2016-04-18 DIAGNOSIS — J9611 Chronic respiratory failure with hypoxia: Secondary | ICD-10-CM | POA: Diagnosis not present

## 2016-04-18 MED ORDER — FLUTICASONE FUROATE-VILANTEROL 100-25 MCG/INH IN AEPB: 1.0000 | INHALATION_SPRAY | Freq: Every day | RESPIRATORY_TRACT | 0 refills | Status: AC

## 2016-04-18 NOTE — Telephone Encounter (Signed)
Patient is out of eliquis and wants to know if they need more samples from office or if they should switch to previously taken xarelto.  Please call [name] to discuss,

## 2016-04-18 NOTE — Telephone Encounter (Signed)
Spoke w/ [name]. Advised him that Per Ryan's last ov:  "She was continued on Eliquis given her CHADS2VASc of 6 and advised to continue this long term." Advise him that I am leaving samples of Eliquis 2.5 mg at the front desk, as well as Eliquis pt assistance form to see if pt qualifies. He is appreciative and will p/u after pt's appt w/ Dr. Mortimer Fries.

## 2016-04-18 NOTE — Progress Notes (Signed)
[facility] Consultation      Date: 04/18/2016,   MRN# [id number] Regina Burke 1933-02-23 Code Status:  Code Status History    Date Active Date Inactive Code Status Order ID Comments User Context   03/30/2016 11:52 AM 04/03/2016  4:11 PM DNR [id number]  Erma Heritage, [place] Inpatient   03/16/2016  4:10 PM 03/24/2016  7:06 PM DNR [id number]  Awilda Bill, NP ED   03/08/2016  3:43 PM 03/14/2016  7:40 PM Partial Code [id number]  Knox Royalty, NP Inpatient   03/04/2016 10:28 PM 03/08/2016  3:43 PM Full Code [id number]  Holley Raring, NP ED   01/19/2016  7:56 PM 01/27/2016  6:01 PM Partial Code [id number]  Vaughan Basta, MD ED   11/26/2015  4:02 AM 11/26/2015  8:59 PM Full Code [id number]  Silver Huguenin, RN Inpatient   06/14/2015  6:57 PM 06/15/2015  8:54 PM DNR [id number]  Colleen Can, MD Inpatient   06/07/2015 10:20 PM 06/14/2015  6:56 PM Full Code [id number]  Lytle Butte, MD ED   05/15/2015  3:27 PM 05/24/2015  5:04 PM Full Code [id number]  Gladstone Lighter, MD ED   05/08/2015 11:23 AM 05/11/2015  3:13 PM Full Code [id number]  Dustin Flock, MD ED   04/15/2015 12:01 AM 04/15/2015  7:03 PM Full Code [id number]  Lance Coon, MD Inpatient   12/23/2014  1:09 PM 12/24/2014  5:17 PM Full Code [id number]  Burnell Blanks, MD Inpatient   12/23/2014  4:22 AM 12/23/2014  1:09 PM Full Code [id number]  Alwyn Pea, MD ED    Questions for Most Recent Historical Code Status (Order [id number])    Question Answer Comment   In the event of cardiac or respiratory ARREST Do not call a "code blue"    In the event of cardiac or respiratory ARREST Do not perform Intubation, CPR, defibrillation or ACLS    In the event of cardiac or respiratory ARREST Use medication by any route, position, wound care, and other measures to relive pain and suffering. May use oxygen, suction and manual treatment of airway obstruction as needed for comfort.      Hosp day:'@LENGTHOFSTAYDAYS'$ @ Referring MD:  '@ATDPROV'$ @     PCP:      AdmissionWeight: 177 lb 3.2 oz (80.4 kg)                 CurrentWeight: 177 lb 3.2 oz (80.4 kg) Regina Burke is a 82 y.o. old female seen in consultation for chronic resp failure      CHIEF COMPLAINT:   Chronic resp failure   HISTORY OF PRESENT ILLNESS  82 yo female with a PMH of Type II diabetes, anemia, GERD, cushing's disease, asthma, CKD stage III, pneumonia, edema, paroxysmal atrial fibrillation, HTN, lung cancer s/p radiation 2015, CAD, NSTEMI, HTN, diverticulitis, and gout.   -She presented to [facility] ER via EMS on 7/20 with c/o worsening shortness of breath. DX with acute CHF and COPD exacerbation -She was recently admitted to the hospital on 03/04/16 and discharged 03/14/16 for CHF exacerbation and Afibb/RVR and was discharged on her normal supplemental oxygen of 3L.  -she has been on chronic oxygen therapy for 2 years. -she has had multiple ICU admission for acute on chronic resp failure -patient has poor resp status-her SOB/DOE  is chronic, ex smoker quit 20 years ago NAD at this time  She feels oK today, no acute SOB, on oxygen, no signs of distress or infection at this  time    PAST MEDICAL HISTORY   Past Medical History:  Diagnosis Date  . Anemia   . Arthritis   . Asthma   . Cardiac resynchronization therapy pacemaker (CRT-P) in place    a. 03/31/16:  Medtronic Percepta Quad CRT-P MRI SureScan (serial Number [id number]) device.  . Chronic diastolic CHF (congestive heart failure) ([place])    a. 10/2015 Echo: EF 55-65%, Gr1 DD, mild MR, mildly dil LA, nl RV fxn, nl PASP.  Marland Kitchen Chronic kidney disease (CKD), stage III (moderate)   . COPD (chronic obstructive pulmonary disease) ([place])   . Coronary artery disease    a. 11/2014 NSTEMI/PCI: LM nl, LAD 66m D1 30, LCX mild dzs, OM1 20p, OM2 259mOM3 90p (2.25x8 Promus Premier DES), RCA nl.   . Cough    CHRONIC AT NIGHT  . Cushing's disease ([facility]  . Depression   . Diverticulitis   . Edema     FEET/LEGS  . GERD (gastroesophageal reflux disease)   . Gout   . History of hiatal hernia   . History of pneumonia   . HLD (hyperlipidemia)   . HOH (hard of hearing)   . Hypertension   . Hypertensive heart disease   . Lung [place]   S/P radiation 2015  . Migraine   . Multiple allergies   . Persistent atrial fibrillation (HCC)    a. on amio, Toprol XL, Cardizem CD, and Eliquis 2.5 mg bid (age & SCr); b. CHADS2VASc ==> 7 (CHF, HTN, age x 2, DM, vascular disease and sex category)  . S/P AV nodal ablation    a. on 03/31/16 for persistant afib with CRT-P placement  . Sleep apnea   . Type II diabetes mellitus ([facility]     SURGICAL HISTORY   Past Surgical History:  Procedure Laterality Date  . ABDOMINAL HYSTERECTOMY    . ABLATION     July 2017  . ADRENALECTOMY Left 1980's   "[place]"  . APPENDECTOMY    . BREAST CYST EXCISION Left   . CATARACT EXTRACTION W/PHACO Right 12/28/2015   Procedure: CATARACT EXTRACTION PHACO AND INTRAOCULAR LENS PLACEMENT (IOC);  Surgeon: WiBirder RobsonMD;  Location: [facility] ORS;  Service: Ophthalmology;  Laterality: Right;  [place]   . CHOLECYSTECTOMY    . CORONARY ANGIOPLASTY WITH STENT PLACEMENT  12/23/2014  . ELECTROPHYSIOLOGIC STUDY N/A 03/08/2016   Procedure: CARDIOVERSION;  Surgeon: AiWende BushyMD;  Location: [facility] ORS;  Service: Cardiovascular;  Laterality: N/A;  . ELECTROPHYSIOLOGIC STUDY N/A 03/07/2016   Procedure: Cardioversion;  Surgeon: AiWende BushyMD;  Location: [facility] ORS;  Service: Cardiovascular;  Laterality: N/A;  . ELECTROPHYSIOLOGIC STUDY N/A 03/31/2016   Procedure: AV Node Ablation;  Surgeon: Will MaMeredith LeedsMD;  [facility];  Service: Cardiovascular;  Laterality: N/A;  . EP IMPLANTABLE DEVICE N/A 03/31/2016   Procedure: BiV Pacemaker Insertion CRT-P;  Surgeon: Will MaMeredith LeedsMD;  [facility];  Service: Cardiovascular;  Laterality: N/A;  . FRACTURE SURGERY    . INSERT / REPLACE /  REMOVE PACEMAKER  02/2016  . LEFT HEART CATHETERIZATION WITH CORONARY ANGIOGRAM N/A 12/23/2014   Procedure: LEFT HEART CATHETERIZATION WITH CORONARY ANGIOGRAM;  Surgeon: ChBurnell BlanksMD;  [facility];  Service: Cardiovascular;  Laterality: N/A;  . PERCUTANEOUS CORONARY STENT INTERVENTION (PCI-S)  12/23/2014   Procedure: PERCUTANEOUS CORONARY STENT INTERVENTION (PCI-S);  Surgeon: ChBurnell BlanksMD;  [facility];  Service: Cardiovascular;;  Promus 2.25x8  . TONSILLECTOMY    . TRANSTHORACIC ECHOCARDIOGRAM  11/26/2015   Technically difficult study. EF 55-60%. Normal wall motion. GR 1 DD.  . TUBAL LIGATION    . WRIST FRACTURE SURGERY Bilateral ~ 2000     FAMILY HISTORY   Family History  Problem Relation Age of Onset  . Heart disease Mother   . Diabetes Mother   . Osteoarthritis Mother   . Hypertension Mother   . Heart disease Father   . Hypertension Father   . COPD Brother      SOCIAL HISTORY   Social History  Substance Use Topics  . Smoking status: Former Smoker    Packs/day: 1.00    Years: 45.00    Types: Cigarettes    Quit date: 04/25/1994  . Smokeless tobacco: Never Used  . Alcohol use No     Comment: 12/23/2014 "might have a couple mixed drinks/year"     MEDICATIONS    Home Medication:  Current Outpatient Rx  . Order #: [id number] Class: Print  . Order #: [id number] Class: Normal  . Order #: [id number] Class: Normal  . Order #: [id number] Class: Historical Med  . Order #: [id number] Class: Historical Med  . Order #: [id number] Class: Historical Med  . Order #: [id number] Class: Historical Med  . Order #: [id number] Class: Historical Med  . Order #: [id number] Class: Historical Med  . Order #: [id number] Class: Normal  . Order #: [id number] Class: Historical Med  . Order #: [id number] Class: Normal  . Order #: [id number] Class: Historical Med  . Order #: [id number] Class: Normal  . Order #: [id number] Class: Normal  . Order #: [id number] Class: Historical  Med    Current Medication:  Current Outpatient Prescriptions:  .  ALPRAZolam (XANAX) 0.25 MG tablet, Take 1 tablet (0.25 mg total) by mouth at bedtime as needed for anxiety., Disp: 7 tablet, Rfl: 0 .  apixaban (ELIQUIS) 2.5 MG TABS tablet, Take 1 tablet (2.5 mg total) by mouth every 12 (twelve) hours., Disp: 60 tablet, Rfl: 0 .  atorvastatin (LIPITOR) 20 MG tablet, Take 1 tablet (20 mg total) by mouth every evening., Disp: 90 tablet, Rfl: 3 .  cholecalciferol (VITAMIN D) 400 UNITS TABS tablet, Take 400 Units by mouth every evening. , Disp: , Rfl:  .  cyanocobalamin 500 MCG tablet, Take 500 mcg by mouth every evening., Disp: , Rfl:  .  febuxostat (ULORIC) 40 MG tablet, Take 40 mg by mouth every evening. , Disp: , Rfl:  .  fenofibrate (TRICOR) 48 MG tablet, Take 48 mg by mouth every evening. , Disp: , Rfl:  .  ferrous sulfate 325 (65 FE) MG EC tablet, Take 325 mg by mouth every evening. , Disp: , Rfl:  .  fluticasone furoate-vilanterol (BREO ELLIPTA) 100-25 MCG/INH AEPB, Inhale 1 puff into the lungs daily., Disp: , Rfl:  .  furosemide (LASIX) 40 MG tablet, Take 1 tablet (40 mg total) by mouth daily., Disp: 30 tablet, Rfl: 6 .  metoprolol succinate (TOPROL XL) 25 MG 24 hr tablet, Take 25 mg by mouth daily. , Disp: 60 tablet, Rfl: 6 .  nitroGLYCERIN (NITROSTAT) 0.4 MG SL tablet, Place 1 tablet (0.4 mg total) under the tongue every 5 (five) minutes as needed for chest pain., Disp: 25 tablet, Rfl: 3 .  pantoprazole (PROTONIX) 40 MG tablet, Take 40 mg by mouth every evening. , Disp: , Rfl:  .  potassium chloride SA (K-DUR,KLOR-CON) 20 MEQ tablet, Take 1 tablet (20 mEq total) by mouth daily. While taking lasix only, Disp: 30 tablet, Rfl: 1 .  sertraline (ZOLOFT) 50 MG tablet, Take 1 tablet (50 mg total) by mouth daily., Disp: 30 tablet, Rfl: 2 .  tiotropium (SPIRIVA) 18 MCG inhalation capsule, Place 18 mcg into inhaler and inhale daily., Disp: , Rfl:     ALLERGIES   Ciprofloxacin; Doxycycline;  Penicillins; Sulfa antibiotics; Latex; Morphine and related; Albuterol sulfate; and Cefuroxime     REVIEW OF SYSTEMS   Review of Systems  Constitutional: Positive for malaise/fatigue. Negative for chills, diaphoresis, fever and weight loss.  HENT: Negative for congestion and hearing loss.   Eyes: Negative for blurred vision and double vision.  Respiratory: Positive for shortness of breath. Negative for cough, hemoptysis, sputum production and wheezing.   Cardiovascular: Positive for leg swelling. Negative for chest pain, palpitations and orthopnea.  Gastrointestinal: Negative for abdominal pain, diarrhea, heartburn, nausea and vomiting.  Genitourinary: Negative for dysuria and urgency.  Musculoskeletal: Negative for back pain, myalgias and neck pain.  Skin: Negative for rash.  Neurological: Negative for dizziness, tingling, tremors, weakness and headaches.  Endo/Heme/Allergies: Does not bruise/bleed easily.  Psychiatric/Behavioral: Negative for depression, substance abuse and suicidal ideas.  All other systems reviewed and are negative.    VS: BP 124/76 (BP Location: Left Arm, Cuff Size: Normal)   Pulse 73   Ht '5\' 4"'$  (1.626 m)   Wt 177 lb 3.2 oz (80.4 kg)   SpO2 93%   BMI 30.42 kg/m      PHYSICAL EXAM  Physical Exam  Constitutional: She is oriented to person, place, and time. She appears well-developed and well-nourished. She appears distressed.  HENT:  Head: Normocephalic and atraumatic.  Mouth/Throat: No oropharyngeal exudate.  Eyes: EOM are normal. Pupils are equal, round, and reactive to light. No scleral icterus.  Neck: Neck supple.  Cardiovascular: Normal rate, regular rhythm and normal heart sounds.   No murmur heard. Pulmonary/Chest: No stridor. No respiratory distress. She has no wheezes.  Abdominal: Soft. Bowel sounds are normal.  Musculoskeletal: Normal range of motion. She exhibits edema.  Neurological: She is alert and oriented to person, place, and time.  No cranial nerve deficit.  Skin: Skin is warm. She is not diaphoretic.  Psychiatric: She has a normal mood and affect.          IMAGING    Dg Chest 2 View  Result Date: 04/01/2016 CLINICAL DATA:  S/P PACEMAKER INSERTION, PT UNABLE TO RAISE LEFT ARM FOR LATERAL PER MD ORDER EXAM: CHEST  2 VIEW COMPARISON:  03/27/2016 FINDINGS: Pacemaker overlies enlarged cardiac silhouette. There is bibasilar effusions and atelectasis not changed. Upper lungs are clear. There is scarring in the RIGHT upper lobe. IMPRESSION: 1. No significant change. 2. Cardiomegaly, bibasilar atelectasis and small effusions Electronically Signed   By: Suzy Bouchard M.D.   On: 04/01/2016 08:38   Dg Chest 2 View  Result Date: 03/27/2016 CLINICAL DATA:  Pulmonary edema EXAM: CHEST  2 VIEW COMPARISON:  03/24/2010 FINDINGS: Cardiac shadow is stable. Aortic calcifications are again seen. Bilateral pleural effusions are again noted. Increasing bibasilar infiltrates are seen when compare with the prior exam. Left jugular central line is again noted and stable. IMPRESSION: Stable pleural effusions with increasing bibasilar infiltrates. Electronically Signed   By: Inez Catalina M.D.   On: 03/27/2016 11:16  Dg Chest Port 1 View  Result Date: 03/24/2016 CLINICAL DATA:  Dyspnea.  History of asthma, COPD, lung cancer. EXAM: PORTABLE CHEST 1 VIEW COMPARISON:  03/20/2016 FINDINGS: Left internal jugular approach central venous catheter unchanged. Cardiomediastinal silhouette is mildly enlarged. Mediastinal contours  appear intact. Calcific atherosclerotic disease of the aorta. There is no evidence of pneumothorax. There are persistent bilateral pleural effusions, slightly enlarged from the prior radiograph. Postsurgical/posttreatment changes in the right upper lobe are stable. Stable prominence of the right hilum. Osseous structures are without acute abnormality. Soft tissues are grossly normal. IMPRESSION: Slightly enlarged bilateral pleural  effusions with bilateral lower lobe airspace consolidation versus atelectasis. Stable posttreatment changes in the right upper lobe. Stable prominence of the right hilum. Electronically Signed   By: Fidela Salisbury M.D.   On: 03/24/2016 11:27  Dg Chest Port 1 View  Result Date: 03/20/2016 CLINICAL DATA:  Respiratory failure. EXAM: PORTABLE CHEST 1 VIEW COMPARISON:  03/19/2016.  01/23/2016. FINDINGS: Left IJ line in stable position. Cardiomegaly with progressive bilateral pulmonary interstitial prominence and bilateral pleural effusions consistent with progressive congestive heart failure. Persistent density in the right upper lobe without significant interim change and most likely related to radiation change. Surgical clips right upper chest. IMPRESSION: 1.  Left IJ line stable position. 2. Congestive heart failure with bilateral pulmonary interstitial edema and pleural effusions. 3. Persistent density in the right upper lobe most consistent with scarring and/or radiation change. Electronically Signed   By: Marcello Moores  Register   On: 03/20/2016 07:11   CXR Images reviewed 04/18/2016 Opacities c/w effusion, atelectasis   ASSESSMENT/PLAN   82 yo white female with chronic resp failure from COPD/CHF with very poor resp status. AT this time, patient needs noninvasive ventilation for survival. She has chronic hypoxia and chronic hyercapnic resp failure, I have convinced patient that she needs noninvasive ventilation  1.oxygen as needed 2.continue breo and spiriva 3.obtain loaner biPAP 4.sleep study scheduled for 9/13  Prognosis is very poor, resp status is very poor. High risk for recurrent admissions   I have personally obtained a history, examined the patient, evaluated laboratory and independently reviewed imaging results, formulated the assessment and plan and placed orders.  The Patient requires high complexity decision making for assessment and support, frequent evaluation and titration of  therapies, application of advanced monitoring technologies and extensive interpretation of multiple databases.   Patient/Family are satisfied with Plan of action and management. All questions answered  Corrin Parker, M.D.  [facility]  Medical Director [facility] Director [facility] Cardio-Pulmonary Department

## 2016-04-18 NOTE — Patient Instructions (Signed)
1.continue oxygen 2.will attempt to get loaner biPAP 3.sleep study scheduled for 9/13 4.continue breo/spiriva  Chronic Respiratory Failure Respiratory failure is when your lungs are not working well and your breathing (respiratory) system fails. When respiratory failure occurs, it is difficult for your lungs to get enough oxygen or get rid of carbon dioxide or both. Respiratory failure can be life threatening.  Respiratory failure can be acute or chronic. Acute respiratory failure is sudden, severe, and requires emergency medical treatment. Chronic respiratory failure is less severe, happens over time, and requires ongoing treatment.  CAUSES  Any problem affecting the heart or lungs can cause respiratory failure. Some of these causes may be:  Chronic bronchitis and emphysema (COPD).  Blood clot going to the lung (pulmonary embolism).  Having water in the lungs caused by heart failure, lung injury, or infection (pulmonary edema).  Collapsed lung (pneumothorax).  Pneumonia.  Pulmonary fibrosis.  Obesity.  Asthma.  Heart failure.  Any type of trauma to the chest that can make breathing difficult.  Nerve or muscle diseases making chest movements difficult. SYMPTOMS  Signs and symptoms of chronic respiratory failure include:  Shortness of breath (dyspnea) with or without activity.  Rapid, fast breathing (tachypnea).  Wheezing.  Fast heart rate.  Bluish color to the fingernail or toenail beds.  Confusion or drowsiness or both. DIAGNOSIS  Initial diagnosis requires a thorough history and a physical exam by your health care provider. Additional tests may include:  Chest X-ray.  CT scan of your lungs.  Ultrasound to check for blood clots.  Blood tests, such as an arterial blood gas test (ABG). This is a blood test that looks at the oxygen and carbon dioxide levels in your arterial blood.  Your vital signs will be taken. This includes your respiratory rate (how many  times a minute you are breathing), oxygen saturation (this measures the oxygen level in your blood), heart rate, and blood pressure. These numbers help your health care provider determine the next steps.  Electrocardiogram. TREATMENT  Treatment of chronic respiratory failure depends on the cause of the respiratory failure. Treatment can include the following:  Oxygen. Oxygen can be delivered through the following:  Nasal cannula. This is small tubing that goes in your nose to give you oxygen.  Face mask. A face mask covers your nose and mouth to give you oxygen.  Medicine. Different medicines can be given to help with breathing. These can include:  Nebulizers. Nebulizers deliver medicines to open the air passages (bronchodilators). These medicines help to open or relax the airways in the lungs so you can breathe better. They can also help loosen mucus from your lungs.  Diuretics. Diuretic medicines can help you breathe better by getting rid of extra fluid in your body.  Steroids. Steroid medicines can help decrease inflammation in your lungs.  Chest tube. If you have a collapsed lung (pneumothorax), a chest tube is placed to help reinflate the lung.  Noninvasive positive pressure ventilation (NPPV). This is a tight-fitting mask that goes over your nose and mouth. The mask has tubing that is attached to a machine. The machine blows air into the tubing, which helps to keep the tiny air sacs (alveoli) in your lungs open. This machine allows you to breathe on your own.  Ventilator. A ventilator is a breathing machine. When on a ventilator, a breathing tube is put into the lungs. A ventilator is used when you can no longer breathe well enough on your own. You may have low  oxygen levels or high carbon dioxide (CO2) levels in your blood. When you are on a ventilator, sedation and pain medicines are given to make you sleep so your lungs can heal. HOME CARE INSTRUCTIONS  Follow your health care  provider's directions about medicines and respiratory therapy.  Quit smoking if you smoke. SEEK MEDICAL CARE IF:  You have increasing shortness of breath and are less functional than you have been.  You have increased sputum, wheezing, coughing, or loss of energy.  You are on oxygen and are requiring more. SEEK IMMEDIATE MEDICAL CARE IF:  Your shortness of breath is significantly worse.  You are unable to say more than a few words without having to catch your breath.  You are much less functional. MAKE SURE YOU:  Understand these instructions.  Will watch your condition.  Will get help right away if you are not doing well or get worse.   This information is not intended to replace advice given to you by your health care provider. Make sure you discuss any questions you have with your health care provider.   Document Released: 08/14/2005 Document Revised: 05/05/2015 Document Reviewed: 06/12/2013 Elsevier Interactive Patient Education [employer].

## 2016-04-19 NOTE — Addendum Note (Signed)
Addended by: Renelda Mom on: 04/19/2016 08:35 AM   Modules accepted: Orders

## 2016-04-28 ENCOUNTER — Inpatient Hospital Stay: Payer: Medicare Other | Admitting: Internal Medicine

## 2016-05-03 ENCOUNTER — Ambulatory Visit: Payer: Medicare Other | Admitting: Family

## 2016-05-10 ENCOUNTER — Ambulatory Visit: Payer: Medicare Other | Attending: Pulmonary Disease

## 2016-05-10 DIAGNOSIS — G4733 Obstructive sleep apnea (adult) (pediatric): Secondary | ICD-10-CM | POA: Diagnosis present

## 2016-05-10 DIAGNOSIS — G4736 Sleep related hypoventilation in conditions classified elsewhere: Secondary | ICD-10-CM | POA: Insufficient documentation

## 2016-05-10 DIAGNOSIS — G4761 Periodic limb movement disorder: Secondary | ICD-10-CM | POA: Diagnosis not present

## 2016-05-12 DIAGNOSIS — G4761 Periodic limb movement disorder: Secondary | ICD-10-CM | POA: Diagnosis not present

## 2016-05-12 DIAGNOSIS — G4733 Obstructive sleep apnea (adult) (pediatric): Secondary | ICD-10-CM | POA: Diagnosis not present

## 2016-05-17 ENCOUNTER — Telehealth: Payer: Self-pay | Admitting: *Deleted

## 2016-05-17 NOTE — Telephone Encounter (Signed)
Tried to call pt in regards to sleep study but unable to LM due to mailbox full. LMOM for her son to return call in regards to results.

## 2016-05-18 NOTE — Telephone Encounter (Signed)
LMOM for pt to return call for results.

## 2016-05-19 ENCOUNTER — Encounter: Payer: Self-pay | Admitting: Cardiovascular Disease

## 2016-05-19 ENCOUNTER — Ambulatory Visit (INDEPENDENT_AMBULATORY_CARE_PROVIDER_SITE_OTHER): Payer: Medicare Other | Admitting: Cardiovascular Disease

## 2016-05-19 VITALS — BP 122/68 | HR 71 | Resp 18 | Ht 64.0 in | Wt 176.8 lb

## 2016-05-19 DIAGNOSIS — I10 Essential (primary) hypertension: Secondary | ICD-10-CM

## 2016-05-19 DIAGNOSIS — I481 Persistent atrial fibrillation: Secondary | ICD-10-CM

## 2016-05-19 DIAGNOSIS — Z9889 Other specified postprocedural states: Secondary | ICD-10-CM

## 2016-05-19 DIAGNOSIS — I2510 Atherosclerotic heart disease of native coronary artery without angina pectoris: Secondary | ICD-10-CM | POA: Diagnosis not present

## 2016-05-19 DIAGNOSIS — I4819 Other persistent atrial fibrillation: Secondary | ICD-10-CM

## 2016-05-19 DIAGNOSIS — I5032 Chronic diastolic (congestive) heart failure: Secondary | ICD-10-CM | POA: Diagnosis not present

## 2016-05-19 DIAGNOSIS — D500 Iron deficiency anemia secondary to blood loss (chronic): Secondary | ICD-10-CM

## 2016-05-19 DIAGNOSIS — J449 Chronic obstructive pulmonary disease, unspecified: Secondary | ICD-10-CM

## 2016-05-19 MED ORDER — APIXABAN 2.5 MG PO TABS: 2.5000 mg | ORAL_TABLET | Freq: Two times a day (BID) | ORAL | 3 refills | Status: DC

## 2016-05-19 NOTE — Telephone Encounter (Signed)
received VM from pt. Tried to call pt back moments later and got no answer.

## 2016-05-19 NOTE — Progress Notes (Signed)
Cardiology Office Note  Date:  05/19/2016   ID:  Regina Burke, DOB 27-Oct-1932, MRN [id number]  PCP:  Tracie Harrier, MD   Chief Complaint  Patient presents with  . Other    59mh FU. Nose running with oxygen use. No cardiac problems reported today. Denied CP/SOB/Swelling.    HPI:  Regina ULLMANis a 82y.o. female With COPD on chronic oxygen,   coronary artery disease with hospitalization  April 2016 stent placed to marginal branch of the circumflex,  paroxysmal atrial fibrillation, severe anemia, lung cancer, diabetes mellitus, episode of acute respiratory distress requiring hospitalization who presents for routine follow-up of her age fibrillation, chronic diastolic CHF  84/8/18561. AV node ablation 2. Biventricular ICD implantation  In follow-up today she is feeling somewhat better. Weight is Down 7 pounds from early august 2017 Required higher dose Lasix 40 mg after her defibrillator procedure secondary to weight gain, Now Lasix down to 20 mg daily epo shot x1 , improved HCT 26.6 up to 29.7  overall feeling stronger, less shortness of breath She denies any problems sleeping, doing well on nasal cannula oxygen   continues to have Lower back pain, central back pain, worse after she has been standing for long periods of time  Uses a cane. Presents today in a wheelchair  EKG on today's visit shows atrial fibrillation/flutter, paced rhythm at 70 bpm  Other past medical history reviewed  [facility], [facility], [facility] pleural effusions requiring thoracentesis, acute on chronic diastolic heart failure,   hospital admission in August 2016  for electrolyte abnormality, digoxin toxicity, readmitted 06/07/2015 with shortness of breath, found to have atrial fibrillation , potassium of 7, renal failure, anemia who presents for routine follow-up after recent hospitalization for atrial fibrillation with RVR  Previously on amiodarone infusion  for atrial fibrillation , converted shortly after to normal sinus rhythm She did have IV infiltration of the left forearm, had to go back to the emergency room and was treated with a special regiment as detailed in the notes.  [facility], etiology Likely multifactorial including profound anemia, atrial fibrillation, severe COPD, pleural effusions . she has not qualified for BiPAP, "did the wrong sleep study (CPAP)."  In the hospital had significant anemia, blood count down to hemoglobin 6.7  Was in atrial fibrillation but Converted to normal sinus rhythm after thoracentesis (first one) Breathing better after second thoracentesis   She did have Digoxin toxicity, This was not restarted at discharge   [facility].   cardiac catheterization  found to have mild 50% disease in the diagonal branch of the LAD, but high-grade 90% hazy stenosis in the marginal branch of the circumflex.  She underwent successful PTCA and insertion of a 2.25x 8 millimeter Promus premier DES stent at the ostium of the OM3 vessell which was postdilated to 2.5 mm.   PMH:   has a past medical history of Anemia; Arthritis; Asthma; Cardiac resynchronization therapy pacemaker (CRT-P) in place; Chronic diastolic CHF (congestive heart failure) ([place]; Chronic kidney disease (CKD), stage III (moderate); COPD (chronic obstructive pulmonary disease) ([place]; Coronary artery disease; Cough; Cushing's disease ([place]; Depression; Diverticulitis; Edema; GERD (gastroesophageal reflux disease); Gout; History of hiatal hernia; History of pneumonia; HLD (hyperlipidemia); HOH (hard of hearing); Hypertension; Hypertensive heart disease; Lung cancer ([place] (dx'd 2014); Migraine; Multiple allergies; Persistent atrial fibrillation ([place]; S/P AV nodal ablation; Sleep  apnea; and Type II diabetes mellitus ([place]).  PSH:     Past Surgical History:  Procedure Laterality Date  . ABDOMINAL HYSTERECTOMY    . ABLATION     July 2017  . ADRENALECTOMY Left 1980's   "[place]"  . APPENDECTOMY    . BREAST CYST EXCISION Left   . CATARACT EXTRACTION W/PHACO Right 12/28/2015   Procedure: CATARACT EXTRACTION PHACO AND INTRAOCULAR LENS PLACEMENT (IOC);  Surgeon: Birder Robson, MD;  Location: [facility] ORS;  Service: Ophthalmology;  Laterality: Right;  [place] 48.4   . CHOLECYSTECTOMY    . CORONARY ANGIOPLASTY WITH STENT PLACEMENT  12/23/2014  . ELECTROPHYSIOLOGIC STUDY N/A 03/08/2016   Procedure: CARDIOVERSION;  Surgeon: Wende Bushy, MD;  Location: [facility] ORS;  Service: Cardiovascular;  Laterality: N/A;  . ELECTROPHYSIOLOGIC STUDY N/A 03/07/2016   Procedure: Cardioversion;  Surgeon: Wende Bushy, MD;  Location: [facility] ORS;  Service: Cardiovascular;  Laterality: N/A;  . ELECTROPHYSIOLOGIC STUDY N/A 03/31/2016   Procedure: AV Node Ablation;  Surgeon: Will Meredith Leeds, MD;  Location: [place] [facility] LAB;  Service: Cardiovascular;  Laterality: N/A;  . EP IMPLANTABLE DEVICE N/A 03/31/2016   Procedure: BiV Pacemaker Insertion CRT-P;  Surgeon: Will Meredith Leeds, MD;  Location: [place] [facility] LAB;  Service: Cardiovascular;  Laterality: N/A;  . FRACTURE SURGERY    . INSERT / REPLACE / REMOVE PACEMAKER  02/2016  . LEFT HEART CATHETERIZATION WITH CORONARY ANGIOGRAM N/A 12/23/2014   Procedure: LEFT HEART CATHETERIZATION WITH CORONARY ANGIOGRAM;  Surgeon: Burnell Blanks, MD;  Location: [facility] CATH LAB;  Service: Cardiovascular;  Laterality: N/A;  . PERCUTANEOUS CORONARY STENT INTERVENTION (PCI-S)  12/23/2014   Procedure: PERCUTANEOUS CORONARY STENT INTERVENTION (PCI-S);  Surgeon: Burnell Blanks, MD;  Location: [facility] CATH LAB;  Service: Cardiovascular;;  Promus 2.25x8  . TONSILLECTOMY    . TRANSTHORACIC ECHOCARDIOGRAM  11/26/2015   Technically difficult study. EF 55-60%. Normal wall motion. GR 1 DD.  . TUBAL LIGATION    . WRIST FRACTURE  SURGERY Bilateral ~ 2000    Current Outpatient Prescriptions  Medication Sig Dispense Refill  . ALPRAZolam (XANAX) 0.25 MG tablet Take 1 tablet (0.25 mg total) by mouth at bedtime as needed for anxiety. 7 tablet 0  . apixaban (ELIQUIS) 2.5 MG TABS tablet Take 1 tablet (2.5 mg total) by mouth every 12 (twelve) hours. 180 tablet 3  . atorvastatin (LIPITOR) 20 MG tablet Take 1 tablet (20 mg total) by mouth every evening. 90 tablet 3  . cholecalciferol (VITAMIN D) 400 UNITS TABS tablet Take 400 Units by mouth every evening.     . cyanocobalamin 500 MCG tablet Take 500 mcg by mouth every evening.    . febuxostat (ULORIC) 40 MG tablet Take 40 mg by mouth every evening.     . fenofibrate (TRICOR) 48 MG tablet Take 48 mg by mouth every evening.     . ferrous sulfate 325 (65 FE) MG EC tablet Take 325 mg by mouth every evening.     . fluticasone furoate-vilanterol (BREO ELLIPTA) 100-25 MCG/INH AEPB Inhale 1 puff into the lungs daily.    . furosemide (LASIX) 40 MG tablet Take 1 tablet (40 mg total) by mouth daily. (Patient taking differently: Take 20 mg by mouth daily. ) 30 tablet 6  . metoprolol succinate (TOPROL XL) 25 MG 24 hr tablet Take 25 mg by mouth daily.  60 tablet 6  . nitroGLYCERIN (NITROSTAT) 0.4 MG SL tablet Place 1 tablet (0.4 mg total) under the tongue every 5 (five) minutes as needed  for chest pain. 25 tablet 3  . pantoprazole (PROTONIX) 40 MG tablet Take 40 mg by mouth every evening.     . sertraline (ZOLOFT) 50 MG tablet Take 1 tablet (50 mg total) by mouth daily. 30 tablet 2  . tiotropium (SPIRIVA) 18 MCG inhalation capsule Place 18 mcg into inhaler and inhale daily.     No current facility-administered medications for this visit.      Allergies:   Ciprofloxacin; Doxycycline; Penicillins; Sulfa antibiotics; Latex; Morphine and related; Albuterol sulfate; and Cefuroxime   Social History:  The patient  reports that she quit smoking about 22 years ago. Her smoking use included  Cigarettes. She has a 45.00 pack-year smoking history. She has never used smokeless tobacco. She reports that she does not drink alcohol or use drugs.   Family History:   family history includes COPD in her brother; Diabetes in her mother; Heart disease in her father and mother; Hypertension in her father and mother; Osteoarthritis in her mother.    Review of Systems: Review of Systems  Constitutional: Positive for malaise/fatigue.  Respiratory: Positive for shortness of breath.   Cardiovascular: Negative.   Gastrointestinal: Negative.   Musculoskeletal: Negative.   Neurological: Negative.   Psychiatric/Behavioral: Negative.   All other systems reviewed and are negative.    PHYSICAL EXAM: VS:  BP 122/68 (BP Location: Right Arm, Patient Position: Sitting, Cuff Size: Normal)   Pulse 71   Resp 18   Ht '5\' 4"'$  (1.626 m)   Wt 176 lb 12 oz (80.2 kg)   BMI 30.34 kg/m  , BMI Body mass index is 30.34 kg/m. GEN: Well nourished, well developed, in no acute distress , wheelchair, cane HEENT: normal  Neck: no JVD, carotid bruits, or masses Cardiac: RRR; no murmurs, rubs, or gallops,no edema  Respiratory:  clear to auscultation bilaterally, normal work of breathing GI: soft, nontender, nondistended, + BS MS: no deformity or atrophy  Skin: warm and dry, no rash Neuro:  Strength and sensation are intact Psych: euthymic mood, full affect    Recent Labs: 06/07/2015: TSH 1.316 03/17/2016: B Natriuretic Peptide 548.0; Magnesium 2.1 03/18/2016: ALT 69 04/01/2016: Hemoglobin 8.0 04/13/2016: BUN 38; Creatinine, Ser 2.20; Platelets 177; Potassium 4.3; Sodium 142    Lipid Panel Lab Results  Component Value Date   CHOL 202 (H) 12/23/2014   HDL 28 (L) 12/23/2014   LDLCALC 107 (H) 12/23/2014   TRIG 337 (H) 12/23/2014      Wt Readings from Last 3 Encounters:  05/19/16 176 lb 12 oz (80.2 kg)  04/18/16 177 lb 3.2 oz (80.4 kg)  04/13/16 177 lb 4 oz (80.4 kg)       ASSESSMENT AND  PLAN:  Chronic diastolic CHF (congestive heart failure) ([place]) - Plan: EKG 12-Lead Appears structurally euvolemic on today's visit, we'll continue Lasix 20 mg daily Currently not on ACE inhibitor, ARB given renal dysfunction  Persistent atrial fibrillation ([facility]) - Plan: EKG 12-Lead Recent AV node ablation, ICD Reports feeling relatively well Tolerating anticoagulation  Coronary artery disease involving native coronary artery of native heart without angina pectoris Currently with no symptoms of angina. No further workup at this time. Continue current medication regimen.  Essential hypertension Blood pressure is well controlled on today's visit. No changes made to the medications.  S/P AV nodal ablation Doing well following her procedure  Iron deficiency anemia due to chronic blood loss Recent lab work discussed with her, improvement in blood count after Epogen Still several point below low normal As  counts continue to improve, shortness of breath, heart failure symptoms should improve  Chronic obstructive pulmonary disease, unspecified COPD type ([place]) She denies any recent COPD exacerbation Reports breathing improved with resolving anemia   Total encounter time more than 25 minutes  Greater than 50% was spent in counseling and coordination of care with the patient   Disposition:   F/U  6 months   Orders Placed This Encounter  Procedures  . EKG 12-Lead     Signed, Esmond Plants, M.D., Ph.D. 05/19/2016  [place], [place]

## 2016-05-19 NOTE — Patient Instructions (Addendum)
Medication Instructions:   No medication changes made We will fax your completed patient assistance paperwork today to [place] so you can get some help with your Eliquis  Labwork:  No new labs needed  Testing/Procedures:  No further testing at this time   Follow-Up: It was a pleasure seeing you in the office today. Please call us if you have new issues that need to be addressed before your next appt.  [phone]  Your physician wants you to follow-up in: 6 months.  You will receive a reminder letter in the mail two months in advance. If you don't receive a letter, please call our office to schedule the follow-up appointment.  If you need a refill on your cardiac medications before your next appointment, please call your pharmacy.

## 2016-05-19 NOTE — Telephone Encounter (Signed)
Spoke with son and he states that he doesn't want me to order CPAP titration. They want to see provider first due to pt unable to tolerate CPAP previously. appt scheduled. Nothing further needed.

## 2016-05-19 NOTE — Telephone Encounter (Signed)
Tried to call pt in regards to sleep study results and unable to [facility]. Called son's number and LM for him or pt to return call.

## 2016-05-22 ENCOUNTER — Encounter: Payer: Self-pay | Admitting: Internal Medicine

## 2016-05-22 ENCOUNTER — Ambulatory Visit (INDEPENDENT_AMBULATORY_CARE_PROVIDER_SITE_OTHER): Payer: Medicare Other | Admitting: Internal Medicine

## 2016-05-22 VITALS — BP 134/68 | HR 76 | Wt 178.0 lb

## 2016-05-22 DIAGNOSIS — G4733 Obstructive sleep apnea (adult) (pediatric): Secondary | ICD-10-CM | POA: Diagnosis not present

## 2016-05-22 DIAGNOSIS — G4730 Sleep apnea, unspecified: Secondary | ICD-10-CM | POA: Diagnosis not present

## 2016-05-22 DIAGNOSIS — J449 Chronic obstructive pulmonary disease, unspecified: Secondary | ICD-10-CM | POA: Diagnosis not present

## 2016-05-22 NOTE — Progress Notes (Signed)
[facility] Consultation      Date: 05/22/2016,   MRN# [id number] LYNNLEY DODDRIDGE Sep 12, 1932 Code Status:   Questions for Most Recent Historical Code Status (Order [id number])    Question Answer Comment   In the event of cardiac or respiratory ARREST Do not call a "code blue"    In the event of cardiac or respiratory ARREST Do not perform Intubation, CPR, defibrillation or ACLS    In the event of cardiac or respiratory ARREST Use medication by any route, position, wound care, and other measures to relive pain and suffering. May use oxygen, suction and manual treatment of airway obstruction as needed for comfort.      Hosp day:'@LENGTHOFSTAYDAYS'$ @ Referring MD: '@ATDPROV'$ @     PCP:      AdmissionWeight: 178 lb (80.7 kg)                 CurrentWeight: 178 lb (80.7 kg) SHINA WASS is a 82 y.o. old female seen in consultation for chronic resp failure      CHIEF COMPLAINT:   Chronic resp failure   HISTORY OF PRESENT ILLNESS  82 yo female with a PMH of Type II diabetes, anemia, GERD, cushing's disease, asthma, CKD stage III, pneumonia, edema, paroxysmal atrial fibrillation, HTN, lung cancer s/p radiation 2015, CAD, NSTEMI, HTN, diverticulitis, and gout.   -She presented to [facility] ER via EMS on 7/20 with c/o worsening shortness of breath. DX with acute CHF and COPD exacerbation -She was recently admitted to the hospital on 03/04/16 and discharged 03/14/16 for CHF exacerbation and Afibb/RVR and was discharged on her normal supplemental oxygen of 3L.  -she has been on chronic oxygen therapy for 2 years. -she has had multiple ICU admission for acute on chronic resp failure -patient has poor resp status-her SOB/DOE  is chronic, ex smoker quit 20 years ago  She feels well today, no acute SOB, on oxygen, no signs of distress or infection at this time  Sleep Study confirms sleep apnea AHI 15  Takes BREO and [id number]        Current Medication:  Current Outpatient  Prescriptions:  .  ALPRAZolam (XANAX) 0.25 MG tablet, Take 1 tablet (0.25 mg total) by mouth at bedtime as needed for anxiety., Disp: 7 tablet, Rfl: 0 .  apixaban (ELIQUIS) 2.5 MG TABS tablet, Take 1 tablet (2.5 mg total) by mouth every 12 (twelve) hours., Disp: 180 tablet, Rfl: 3 .  atorvastatin (LIPITOR) 20 MG tablet, Take 1 tablet (20 mg total) by mouth every evening., Disp: 90 tablet, Rfl: 3 .  cholecalciferol (VITAMIN D) 400 UNITS TABS tablet, Take 400 Units by mouth every evening. , Disp: , Rfl:  .  cyanocobalamin 500 MCG tablet, Take 500 mcg by mouth every evening., Disp: , Rfl:  .  febuxostat (ULORIC) 40 MG tablet, Take 40 mg by mouth every evening. , Disp: , Rfl:  .  fenofibrate (TRICOR) 48 MG tablet, Take 48 mg by mouth every evening. , Disp: , Rfl:  .  ferrous sulfate 325 (65 FE) MG EC tablet, Take 325 mg by mouth every evening. , Disp: , Rfl:  .  fluticasone furoate-vilanterol (BREO ELLIPTA) 100-25 MCG/INH AEPB, Inhale 1 puff into the lungs daily., Disp: , Rfl:  .  furosemide (LASIX) 40 MG tablet, Take 1 tablet (40 mg total) by mouth daily. (Patient taking differently: Take 20 mg by mouth daily. ), Disp: 30 tablet, Rfl: 6 .  metoprolol succinate (TOPROL XL) 25 MG 24 hr tablet,  Take 25 mg by mouth daily. , Disp: 60 tablet, Rfl: 6 .  nitroGLYCERIN (NITROSTAT) 0.4 MG SL tablet, Place 1 tablet (0.4 mg total) under the tongue every 5 (five) minutes as needed for chest pain., Disp: 25 tablet, Rfl: 3 .  pantoprazole (PROTONIX) 40 MG tablet, Take 40 mg by mouth every evening. , Disp: , Rfl:  .  sertraline (ZOLOFT) 50 MG tablet, Take 1 tablet (50 mg total) by mouth daily., Disp: 30 tablet, Rfl: 2 .  tiotropium (SPIRIVA) 18 MCG inhalation capsule, Place 18 mcg into inhaler and inhale daily., Disp: , Rfl:     ALLERGIES   Ciprofloxacin; Doxycycline; Penicillins; Sulfa antibiotics; Latex; Morphine and related; Albuterol sulfate; and Cefuroxime     REVIEW OF SYSTEMS   Review of Systems    Constitutional: Negative for chills, diaphoresis, fever, malaise/fatigue and weight loss.  HENT: Negative for congestion.   Eyes: Negative for blurred vision and double vision.  Respiratory: Positive for shortness of breath. Negative for cough, hemoptysis, sputum production and wheezing.   Cardiovascular: Positive for leg swelling. Negative for chest pain, palpitations and orthopnea.  Gastrointestinal: Negative for abdominal pain, heartburn, nausea and vomiting.  Neurological: Negative for weakness.  All other systems reviewed and are negative.    VS: BP 134/68 (BP Location: Right Arm, Cuff Size: Normal)   Pulse 76   Wt 178 lb (80.7 kg)   SpO2 91%   BMI 30.55 kg/m      PHYSICAL EXAM  Physical Exam  Constitutional: She is oriented to person, place, and time. She appears well-developed and well-nourished. No distress.  HENT:  Mouth/Throat: No oropharyngeal exudate.  Eyes: No scleral icterus.  Cardiovascular: Normal rate, regular rhythm and normal heart sounds.   No murmur heard. Pulmonary/Chest: Effort normal and breath sounds normal. No stridor. No respiratory distress. She has no wheezes.  Musculoskeletal: Normal range of motion. She exhibits edema.  Neurological: She is alert and oriented to person, place, and time. No cranial nerve deficit.  Skin: Skin is warm. She is not diaphoretic.  Psychiatric: She has a normal mood and affect.       ASSESSMENT/PLAN   82 yo white female with chronic resp failure from COPD/CHF with very poor resp status. AT this time, patient needs noninvasive ventilation for survival. She has chronic hypoxia and chronic hyercapnic resp failure, I have convinced patient that she needs noninvasive ventilation  Sleep Study Confirms OSA with  AHI 15  1.continue oxygen  2.continue breo and spiriva 3.AUTOCPAP titration with mask desensitization referral   Prognosis is very poor, resp status is very poor. High risk for recurrent admissions   The  Patient requires high complexity decision making for assessment and support, frequent evaluation and titration of therapies, application of advanced monitoring technologies and extensive interpretation of multiple databases.   Patient/Family are satisfied with Plan of action and management. All questions answered  Corrin Parker, M.D.  [facility]  Medical Director [place] Director [facility] Cardio-Pulmonary Department

## 2016-05-22 NOTE — Patient Instructions (Addendum)
Start autoCPAP titration Mask assessment in [place]  Sleep Apnea  Sleep apnea is a sleep disorder characterized by abnormal pauses in breathing while you sleep. When your breathing pauses, the level of oxygen in your blood decreases. This causes you to move out of deep sleep and into light sleep. As a result, your quality of sleep is poor, and the system that carries your blood throughout your body (cardiovascular system) experiences stress. If sleep apnea remains untreated, the following conditions can develop:  High blood pressure (hypertension).  Coronary artery disease.  Inability to achieve or maintain an erection (impotence).  Impairment of your thought process (cognitive dysfunction). There are three types of sleep apnea: 1. Obstructive sleep apnea--Pauses in breathing during sleep because of a blocked airway. 2. Central sleep apnea--Pauses in breathing during sleep because the area of the brain that controls your breathing does not send the correct signals to the muscles that control breathing. 3. Mixed sleep apnea--A combination of both obstructive and central sleep apnea. RISK FACTORS The following risk factors can increase your risk of developing sleep apnea:  Being overweight.  Smoking.  Having narrow passages in your nose and throat.  Being of older age.  Being female.  Alcohol use.  Sedative and tranquilizer use.  Ethnicity. Among individuals younger than 35 years, African Americans are at increased risk of sleep apnea. SYMPTOMS   Difficulty staying asleep.  Daytime sleepiness and fatigue.  Loss of energy.  Irritability.  Loud, heavy snoring.  Morning headaches.  Trouble concentrating.  Forgetfulness.  Decreased interest in sex.  Unexplained sleepiness. DIAGNOSIS  In order to diagnose sleep apnea, your caregiver will perform a physical examination. A sleep study done in the comfort of your own home may be appropriate if you are otherwise healthy. Your  caregiver may also recommend that you spend the night in a sleep lab. In the sleep lab, several monitors record information about your heart, lungs, and brain while you sleep. Your leg and arm movements and blood oxygen level are also recorded. TREATMENT The following actions may help to resolve mild sleep apnea:  Sleeping on your side.   Using a decongestant if you have nasal congestion.   Avoiding the use of depressants, including alcohol, sedatives, and narcotics.   Losing weight and modifying your diet if you are overweight. There also are devices and treatments to help open your airway:  Oral appliances. These are custom-made mouthpieces that shift your lower jaw forward and slightly open your bite. This opens your airway.  Devices that create positive airway pressure. This positive pressure "splints" your airway open to help you breathe better during sleep. The following devices create positive airway pressure:  Continuous positive airway pressure (CPAP) device. The CPAP device creates a continuous level of air pressure with an air pump. The air is delivered to your airway through a mask while you sleep. This continuous pressure keeps your airway open.  Nasal expiratory positive airway pressure (EPAP) device. The EPAP device creates positive air pressure as you exhale. The device consists of single-use valves, which are inserted into each nostril and held in place by adhesive. The valves create very little resistance when you inhale but create much more resistance when you exhale. That increased resistance creates the positive airway pressure. This positive pressure while you exhale keeps your airway open, making it easier to breath when you inhale again.  Bilevel positive airway pressure (BPAP) device. The BPAP device is used mainly in patients with central sleep apnea. This  device is similar to the CPAP device because it also uses an air pump to deliver continuous air pressure through  a mask. However, with the BPAP machine, the pressure is set at two different levels. The pressure when you exhale is lower than the pressure when you inhale.  Surgery. Typically, surgery is only done if you cannot comply with less invasive treatments or if the less invasive treatments do not improve your condition. Surgery involves removing excess tissue in your airway to create a wider passage way.   This information is not intended to replace advice given to you by your health care provider. Make sure you discuss any questions you have with your health care provider.   Document Released: 08/04/2002 Document Revised: 09/04/2014 Document Reviewed: 12/21/2011 Elsevier Interactive Patient Education [employer].

## 2016-05-26 ENCOUNTER — Telehealth: Payer: Self-pay

## 2016-05-26 NOTE — Telephone Encounter (Signed)
Patient has been approved through the patient assistance program for Eliquis starting 05/22/2016 through 08/27/2016.

## 2016-06-07 NOTE — Progress Notes (Deleted)
[place]  Telephone:(336) [phone] Fax:(336) [phone]  ID: Regina Burke OB: 1933/03/02  MR#: [id number]  QIO#:[id number]  Patient Care Team: Tracie Harrier, MD as PCP - General (Internal Medicine) Minna Merritts, MD as Consulting Physician (Cardiology) Alisa Graff, FNP as Nurse Practitioner (Family Medicine) Laverle Hobby, MD as Consulting Physician (Pulmonary Disease) Lavonia Dana, MD as Consulting Physician (Internal Medicine)  CHIEF COMPLAINT: Anemia secondary to chronic renal failure  INTERVAL HISTORY: ***  REVIEW OF SYSTEMS:   ROS  As per HPI. Otherwise, a complete review of systems is negative.  PAST MEDICAL HISTORY: Past Medical History:  Diagnosis Date  . Anemia   . Arthritis   . Asthma   . Cardiac resynchronization therapy pacemaker (CRT-P) in place    a. 03/31/16:  Medtronic Percepta Quad CRT-P MRI SureScan (serial Number [id number]) device.  . Chronic diastolic CHF (congestive heart failure) ([place])    a. 10/2015 Echo: EF 55-65%, Gr1 DD, mild MR, mildly dil LA, nl RV fxn, nl PASP.  Marland Kitchen Chronic kidney disease (CKD), stage III (moderate)   . COPD (chronic obstructive pulmonary disease) ([place])   . Coronary artery disease    a. 11/2014 NSTEMI/PCI: LM nl, LAD 9m D1 30, LCX mild dzs, OM1 20p, OM2 211mOM3 90p (2.25x8 Promus Premier DES), RCA nl.   . Cough    CHRONIC AT NIGHT  . Cushing's disease ([place]  . Depression   . Diverticulitis   . Edema    FEET/LEGS  . GERD (gastroesophageal reflux disease)   . Gout   . History of hiatal hernia   . History of pneumonia   . HLD (hyperlipidemia)   . HOH (hard of hearing)   . Hypertension   . Hypertensive heart disease   . Lung cancer ([facility]dx'd 2014   S/P radiation 2015  . Migraine   . Multiple allergies   . Persistent atrial fibrillation (HCC)    a. on amio, Toprol XL, Cardizem CD, and Eliquis 2.5 mg bid (age & SCr); b. CHADS2VASc ==> 7 (CHF, HTN, age x 2, DM, vascular disease and  sex category)  . S/P AV nodal ablation    a. on 03/31/16 for persistant afib with CRT-P placement  . Sleep apnea   . Type II diabetes mellitus ([place]    PAST SURGICAL HISTORY: Past Surgical History:  Procedure Laterality Date  . ABDOMINAL HYSTERECTOMY    . ABLATION     July 2017  . ADRENALECTOMY Left 1980's   "Cushings"  . APPENDECTOMY    . BREAST CYST EXCISION Left   . CATARACT EXTRACTION W/PHACO Right 12/28/2015   Procedure: CATARACT EXTRACTION PHACO AND INTRAOCULAR LENS PLACEMENT (IOC);  Surgeon: WiBirder RobsonMD;  Location: [facility] ORS;  Service: Ophthalmology;  Laterality: Right;  [place]   . CHOLECYSTECTOMY    . CORONARY ANGIOPLASTY WITH STENT PLACEMENT  12/23/2014  . ELECTROPHYSIOLOGIC STUDY N/A 03/08/2016   Procedure: CARDIOVERSION;  Surgeon: AiWende BushyMD;  Location: [facility] ORS;  Service: Cardiovascular;  Laterality: N/A;  . ELECTROPHYSIOLOGIC STUDY N/A 03/07/2016   Procedure: Cardioversion;  Surgeon: AiWende BushyMD;  Location: [facility] ORS;  Service: Cardiovascular;  Laterality: N/A;  . ELECTROPHYSIOLOGIC STUDY N/A 03/31/2016   Procedure: AV Node Ablation;  Surgeon: Will MaMeredith LeedsMD;  [facility];  Service: Cardiovascular;  Laterality: N/A;  . EP IMPLANTABLE DEVICE N/A 03/31/2016   Procedure: BiV Pacemaker Insertion CRT-P;  Surgeon: Will MaMeredith LeedsMD;  [facility];  Service: Cardiovascular;  Laterality: N/A;  . FRACTURE SURGERY    . INSERT / REPLACE / REMOVE PACEMAKER  02/2016  . LEFT HEART CATHETERIZATION WITH CORONARY ANGIOGRAM N/A 12/23/2014   Procedure: LEFT HEART CATHETERIZATION WITH CORONARY ANGIOGRAM;  Surgeon: Burnell Blanks, MD;  Location[facility] CATH LAB;  Service: Cardiovascular;  Laterality: N/A;  . PERCUTANEOUS CORONARY STENT INTERVENTION (PCI-S)  12/23/2014   Procedure: PERCUTANEOUS CORONARY STENT INTERVENTION (PCI-S);  Surgeon: Burnell Blanks, MD;  Location: [facility] CATH LAB;  Service: Cardiovascular;;  Promus 2.25x8   . TONSILLECTOMY    . TRANSTHORACIC ECHOCARDIOGRAM  11/26/2015   Technically difficult study. EF 55-60%. Normal wall motion. GR 1 DD.  . TUBAL LIGATION    . WRIST FRACTURE SURGERY Bilateral ~ 2000    FAMILY HISTORY: Family History  Problem Relation Age of Onset  . Heart disease Mother   . Diabetes Mother   . Osteoarthritis Mother   . Hypertension Mother   . Heart disease Father   . Hypertension Father   . COPD Brother     ADVANCED DIRECTIVES (Y/N):  N  HEALTH MAINTENANCE: Social History  Substance Use Topics  . Smoking status: Former Smoker    Packs/day: 1.00    Years: 45.00    Types: Cigarettes    Quit date: 04/25/1994  . Smokeless tobacco: Never Used  . Alcohol use No     Comment: 12/23/2014 "might have a couple mixed drinks/year"     Colonoscopy:  PAP:  Bone density:  Lipid panel:  Allergies  Allergen Reactions  . Ciprofloxacin Shortness Of Breath, Itching and Rash  . Doxycycline Shortness Of Breath, Itching and Rash  . Penicillins Shortness Of Breath, Itching, Rash and Other (See Comments)    Has patient had a PCN reaction causing immediate rash, facial/tongue/throat swelling, SOB or lightheadedness with hypotension: Yes Has patient had a PCN reaction causing severe rash involving mucus membranes or skin necrosis: No Has patient had a PCN reaction that required hospitalization No Has patient had a PCN reaction occurring within the last 10 years: No If all of the above answers are "NO", then may proceed with Cephalosporin use.  . Sulfa Antibiotics Shortness Of Breath, Itching and Rash  . Latex Itching  . Morphine And Related Itching  . Albuterol Sulfate   . Cefuroxime Rash    Blisters in mouth    Current Outpatient Prescriptions  Medication Sig Dispense Refill  . ALPRAZolam (XANAX) 0.25 MG tablet Take 1 tablet (0.25 mg total) by mouth at bedtime as needed for anxiety. 7 tablet 0  . apixaban (ELIQUIS) 2.5 MG TABS tablet Take 1 tablet (2.5 mg total) by  mouth every 12 (twelve) hours. 180 tablet 3  . atorvastatin (LIPITOR) 20 MG tablet Take 1 tablet (20 mg total) by mouth every evening. 90 tablet 3  . cholecalciferol (VITAMIN D) 400 UNITS TABS tablet Take 400 Units by mouth every evening.     . cyanocobalamin 500 MCG tablet Take 500 mcg by mouth every evening.    . febuxostat (ULORIC) 40 MG tablet Take 40 mg by mouth every evening.     . fenofibrate (TRICOR) 48 MG tablet Take 48 mg by mouth every evening.     . ferrous sulfate 325 (65 FE) MG EC tablet Take 325 mg by mouth every evening.     . fluticasone furoate-vilanterol (BREO ELLIPTA) 100-25 MCG/INH AEPB Inhale 1 puff into the lungs daily.    . furosemide (LASIX) 40 MG tablet Take 1 tablet (  40 mg total) by mouth daily. (Patient taking differently: Take 20 mg by mouth daily. ) 30 tablet 6  . metoprolol succinate (TOPROL XL) 25 MG 24 hr tablet Take 25 mg by mouth daily.  60 tablet 6  . nitroGLYCERIN (NITROSTAT) 0.4 MG SL tablet Place 1 tablet (0.4 mg total) under the tongue every 5 (five) minutes as needed for chest pain. 25 tablet 3  . pantoprazole (PROTONIX) 40 MG tablet Take 40 mg by mouth every evening.     . sertraline (ZOLOFT) 50 MG tablet Take 1 tablet (50 mg total) by mouth daily. 30 tablet 2  . tiotropium (SPIRIVA) 18 MCG inhalation capsule Place 18 mcg into inhaler and inhale daily.     No current facility-administered medications for this visit.     OBJECTIVE: There were no vitals filed for this visit.   There is no height or weight on file to calculate BMI.    ECOG FS:{CHL ONC [id number]  General: Well-developed, well-nourished, no acute distress. Eyes: Pink conjunctiva, anicteric sclera. HEENT: Normocephalic, moist mucous membranes, clear oropharnyx. Lungs: Clear to auscultation bilaterally. Heart: Regular rate and rhythm. No rubs, murmurs, or gallops. Abdomen: Soft, nontender, nondistended. No organomegaly noted, normoactive bowel sounds. Musculoskeletal: No edema,  cyanosis, or clubbing. Neuro: Alert, answering all questions appropriately. Cranial nerves grossly intact. Skin: No rashes or petechiae noted. Psych: Normal affect. Lymphatics: No cervical, calvicular, axillary or inguinal LAD.   LAB RESULTS:  Lab Results  Component Value Date   NA 142 04/13/2016   K 4.3 04/13/2016   CL 96 04/13/2016   CO2 27 04/13/2016   GLUCOSE 105 (H) 04/13/2016   BUN 38 (H) 04/13/2016   CREATININE 2.20 (H) 04/13/2016   CALCIUM 9.0 04/13/2016   PROT 5.6 (L) 03/18/2016   ALBUMIN 3.3 (L) 03/29/2016   ALBUMIN 3.3 (L) 03/29/2016   AST 29 03/18/2016   ALT 69 (H) 03/18/2016   ALKPHOS 47 03/18/2016   BILITOT 1.1 03/18/2016   GFRNONAA 21 (L) 04/13/2016   GFRAA 24 (L) 04/13/2016    Lab Results  Component Value Date   WBC 5.0 04/13/2016   NEUTROABS 4.4 03/21/2016   HGB 8.0 (L) 04/01/2016   HCT 29.7 (L) 04/13/2016   MCV 81 04/13/2016   PLT 177 04/13/2016     STUDIES: No results found.  ASSESSMENT: Anemia secondary to chronic renal failure  PLAN:    1. Anemia secondary to chronic renal failure:  Patient expressed understanding and was in agreement with this plan. She also understands that She can call clinic at any time with any questions, concerns, or complaints.   Cancer of upper lobe of right lung [facility])   Staging form: Lung, AJCC 7th Edition   - Clinical: T1, N0, M0 - Signed by Forest Gleason, MD on 01/08/2015  Lloyd Huger, MD   06/07/2016 10:37 PM

## 2016-06-08 ENCOUNTER — Inpatient Hospital Stay: Payer: Medicare Other | Admitting: Oncology

## 2016-06-19 ENCOUNTER — Inpatient Hospital Stay: Payer: Medicare Other | Attending: Oncology | Admitting: Oncology

## 2016-06-19 ENCOUNTER — Inpatient Hospital Stay: Payer: Medicare Other

## 2016-06-19 VITALS — BP 122/79 | HR 81 | Temp 98.2°F | Resp 18 | Wt 180.1 lb

## 2016-06-19 DIAGNOSIS — I481 Persistent atrial fibrillation: Secondary | ICD-10-CM | POA: Insufficient documentation

## 2016-06-19 DIAGNOSIS — F329 Major depressive disorder, single episode, unspecified: Secondary | ICD-10-CM | POA: Insufficient documentation

## 2016-06-19 DIAGNOSIS — I130 Hypertensive heart and chronic kidney disease with heart failure and stage 1 through stage 4 chronic kidney disease, or unspecified chronic kidney disease: Secondary | ICD-10-CM | POA: Diagnosis not present

## 2016-06-19 DIAGNOSIS — M109 Gout, unspecified: Secondary | ICD-10-CM | POA: Diagnosis not present

## 2016-06-19 DIAGNOSIS — Z923 Personal history of irradiation: Secondary | ICD-10-CM | POA: Diagnosis not present

## 2016-06-19 DIAGNOSIS — G4730 Sleep apnea, unspecified: Secondary | ICD-10-CM | POA: Insufficient documentation

## 2016-06-19 DIAGNOSIS — Z79899 Other long term (current) drug therapy: Secondary | ICD-10-CM | POA: Diagnosis not present

## 2016-06-19 DIAGNOSIS — Z87891 Personal history of nicotine dependence: Secondary | ICD-10-CM | POA: Diagnosis not present

## 2016-06-19 DIAGNOSIS — N183 Chronic kidney disease, stage 3 (moderate): Secondary | ICD-10-CM | POA: Diagnosis not present

## 2016-06-19 DIAGNOSIS — K219 Gastro-esophageal reflux disease without esophagitis: Secondary | ICD-10-CM | POA: Insufficient documentation

## 2016-06-19 DIAGNOSIS — Z950 Presence of cardiac pacemaker: Secondary | ICD-10-CM | POA: Diagnosis not present

## 2016-06-19 DIAGNOSIS — G43909 Migraine, unspecified, not intractable, without status migrainosus: Secondary | ICD-10-CM | POA: Diagnosis not present

## 2016-06-19 DIAGNOSIS — N189 Chronic kidney disease, unspecified: Secondary | ICD-10-CM

## 2016-06-19 DIAGNOSIS — I5032 Chronic diastolic (congestive) heart failure: Secondary | ICD-10-CM | POA: Diagnosis not present

## 2016-06-19 DIAGNOSIS — I2510 Atherosclerotic heart disease of native coronary artery without angina pectoris: Secondary | ICD-10-CM | POA: Diagnosis not present

## 2016-06-19 DIAGNOSIS — D631 Anemia in chronic kidney disease: Secondary | ICD-10-CM | POA: Insufficient documentation

## 2016-06-19 DIAGNOSIS — J449 Chronic obstructive pulmonary disease, unspecified: Secondary | ICD-10-CM | POA: Diagnosis not present

## 2016-06-19 DIAGNOSIS — M1990 Unspecified osteoarthritis, unspecified site: Secondary | ICD-10-CM | POA: Insufficient documentation

## 2016-06-19 DIAGNOSIS — E1122 Type 2 diabetes mellitus with diabetic chronic kidney disease: Secondary | ICD-10-CM | POA: Insufficient documentation

## 2016-06-19 DIAGNOSIS — Z7901 Long term (current) use of anticoagulants: Secondary | ICD-10-CM | POA: Insufficient documentation

## 2016-06-19 DIAGNOSIS — I252 Old myocardial infarction: Secondary | ICD-10-CM | POA: Diagnosis not present

## 2016-06-19 DIAGNOSIS — C3411 Malignant neoplasm of upper lobe, right bronchus or lung: Secondary | ICD-10-CM | POA: Insufficient documentation

## 2016-06-19 DIAGNOSIS — E785 Hyperlipidemia, unspecified: Secondary | ICD-10-CM | POA: Insufficient documentation

## 2016-06-19 LAB — IRON AND TIBC
Iron: 65 ug/dL (ref 28–170)
SATURATION RATIOS: 12 % (ref 10.4–31.8)
TIBC: 538 ug/dL — ABNORMAL HIGH (ref 250–450)
UIBC: 473 ug/dL

## 2016-06-19 LAB — CBC WITH DIFFERENTIAL/PLATELET
BASOS ABS: 0 10*3/uL (ref 0–0.1)
BASOS PCT: 1 %
Eosinophils Absolute: 0.2 10*3/uL (ref 0–0.7)
Eosinophils Relative: 3 %
HEMATOCRIT: 31.7 % — AB (ref 35.0–47.0)
HEMOGLOBIN: 10.6 g/dL — AB (ref 12.0–16.0)
LYMPHS PCT: 11 %
Lymphs Abs: 0.8 10*3/uL — ABNORMAL LOW (ref 1.0–3.6)
MCH: 26.9 pg (ref 26.0–34.0)
MCHC: 33.5 g/dL (ref 32.0–36.0)
MCV: 80.3 fL (ref 80.0–100.0)
Monocytes Absolute: 0.4 10*3/uL (ref 0.2–0.9)
Monocytes Relative: 6 %
NEUTROS ABS: 5.7 10*3/uL (ref 1.4–6.5)
NEUTROS PCT: 81 %
Platelets: 179 10*3/uL (ref 150–440)
RBC: 3.94 MIL/uL (ref 3.80–5.20)
RDW: 15.4 % — AB (ref 11.5–14.5)
WBC: 7.1 10*3/uL (ref 3.6–11.0)

## 2016-06-19 LAB — FERRITIN: Ferritin: 56 ng/mL (ref 11–307)

## 2016-06-19 NOTE — Progress Notes (Signed)
New evaluation for anemia. States has had anemia for awhile. Recently given injection of procrit prior to surgery in September. Complains of feeling weak, tired, and dizzy today.

## 2016-06-19 NOTE — Progress Notes (Signed)
[place]  [phone]) [phone] [phone]) [phone]  ID: ARITA SEVERTSON OB: 07-02-1933  MR#: [id number]  CVE#:[id number]  Patient Care Team: Tracie Harrier, MD as PCP - General (Internal Medicine) Minna Merritts, MD as Consulting Physician (Cardiology) Alisa Graff, FNP as Nurse Practitioner (Family Medicine) Laverle Hobby, MD as Consulting Physician (Pulmonary Disease) Lavonia Dana, MD as Consulting Physician (Internal Medicine)  CHIEF COMPLAINT: Anemia secondary to chronic renal failure, clinical stage IA adenocarcinoma of right upper lobe lung.  INTERVAL HISTORY: Patient is an 82 year old female with a history of a right upper lobe lung cancer status post radiation therapy who was noted to have a declining hemoglobin secondary to chronic renal failure. She was last evaluated in February 2017 and is referred back for further evaluation. She complains of increased weakness and fatigue. She also has mild dizziness. She has no other neurologic complaints. She denies any recent fevers. She denies any chest pain, but has chronic shortness of breath. She has no nausea, vomiting, constipation, or diarrhea. She has no urinary complaints. Patient otherwise feels well and offers no further specific complaints.  REVIEW OF SYSTEMS:   Review of Systems  Constitutional: Positive for malaise/fatigue. Negative for fever and weight loss.  Respiratory: Positive for shortness of breath. Negative for cough and hemoptysis.   Cardiovascular: Negative.  Negative for leg swelling.  Gastrointestinal: Negative.  Negative for abdominal pain, blood in stool and melena.  Genitourinary: Negative.   Musculoskeletal: Negative.   Neurological: Positive for weakness.  Psychiatric/Behavioral: Negative.  The patient is not nervous/anxious.     As per HPI. Otherwise, a complete review of systems is negative.  PAST MEDICAL HISTORY: Past Medical History:  Diagnosis Date  . Anemia    . Arthritis   . Asthma   . Cardiac resynchronization therapy pacemaker (CRT-P) in place    a. 03/31/16:  Medtronic Percepta Quad CRT-P MRI SureScan (serial Number [id number]) device.  . Chronic diastolic CHF (congestive heart failure) ([place])    a. 10/2015 Echo: EF 55-65%, Gr1 DD, mild MR, mildly dil LA, nl RV fxn, nl PASP.  Marland Kitchen Chronic kidney disease (CKD), stage III (moderate)   . COPD (chronic obstructive pulmonary disease) ([place])   . Coronary artery disease    a. 11/2014 NSTEMI/PCI: LM nl, LAD 62m D1 30, LCX mild dzs, OM1 20p, OM2 248mOM3 90p (2.25x8 Promus Premier DES), RCA nl.   . Cough    CHRONIC AT NIGHT  . Cushing's disease ([facility]  . Depression   . Diverticulitis   . Edema    FEET/LEGS  . GERD (gastroesophageal reflux disease)   . Gout   . History of hiatal hernia   . History of pneumonia   . HLD (hyperlipidemia)   . HOH (hard of hearing)   . Hypertension   . Hypertensive heart disease   . Lung cancer (HCLeedsdx'd 2014   S/P radiation 2015  . Migraine   . Multiple allergies   . Persistent atrial fibrillation (HCC)    a. on amio, Toprol XL, Cardizem CD, and Eliquis 2.5 mg bid (age & SCr); b. CHADS2VASc ==> 7 (CHF, HTN, age x 2, DM, vascular disease and sex category)  . S/P AV nodal ablation    a. on 03/31/16 for persistant afib with CRT-P placement  . Sleep apnea   . Type II diabetes mellitus ([place]CCor[place]    PAST SURGICAL HISTORY: Past Surgical History:  Procedure Laterality Date  . ABDOMINAL HYSTERECTOMY    .  ABLATION     July 2017  . ADRENALECTOMY Left 1980's   "[place]ushings"  . APPENDECTOMY    . BREAST CYST EXCISION Left   . CATARACT EXTRACTION W/PHACO Right 12/28/2015   Procedure: CATARACT EXTRACTION PHACO AND INTRAOCULAR LENS PLACEMENT (IOC);  Surgeon: Birder Robson, MD;  Location: [facility] ORS;  Service: Ophthalmology;  Laterality: Right;  [place] 48.4   . CHOLECYSTECTOMY    . CORONARY ANGIOPLASTY WITH STENT PLACEMENT  12/23/2014  . ELECTROPHYSIOLOGIC STUDY N/A 03/08/2016    Procedure: CARDIOVERSION;  Surgeon: Wende Bushy, MD;  Location: [facility] ORS;  Service: Cardiovascular;  Laterality: N/A;  . ELECTROPHYSIOLOGIC STUDY N/A 03/07/2016   Procedure: Cardioversion;  Surgeon: Wende Bushy, MD;  Location: [facility] ORS;  Service: Cardiovascular;  Laterality: N/A;  . ELECTROPHYSIOLOGIC STUDY N/A 03/31/2016   Procedure: AV Node Ablation;  Surgeon: Will Meredith Leeds, MD;  Location: [place] CV LAB;  Service: Cardiovascular;  Laterality: N/A;  . EP IMPLANTABLE DEVICE N/A 03/31/2016   Procedure: BiV Pacemaker Insertion CRT-P;  Surgeon: Will Meredith Leeds, MD;  Location: [place] CV LAB;  Service: Cardiovascular;  Laterality: N/A;  . FRACTURE SURGERY    . INSERT / REPLACE / REMOVE PACEMAKER  02/2016  . LEFT HEART CATHETERIZATION WITH CORONARY ANGIOGRAM N/A 12/23/2014   Procedure: LEFT HEART CATHETERIZATION WITH CORONARY ANGIOGRAM;  Surgeon: Burnell Blanks, MD;  Location: [facility] CATH LAB;  Service: Cardiovascular;  Laterality: N/A;  . PERCUTANEOUS CORONARY STENT INTERVENTION (PCI-S)  12/23/2014   Procedure: PERCUTANEOUS CORONARY STENT INTERVENTION (PCI-S);  Surgeon: Burnell Blanks, MD;  Location: [facility] CATH LAB;  Service: Cardiovascular;;  Promus 2.25x8  . TONSILLECTOMY    . TRANSTHORACIC ECHOCARDIOGRAM  11/26/2015   Technically difficult study. EF 55-60%. Normal wall motion. GR 1 DD.  . TUBAL LIGATION    . WRIST FRACTURE SURGERY Bilateral ~ 2000    FAMILY HISTORY: Family History  Problem Relation Age of Onset  . Heart disease Mother   . Diabetes Mother   . Osteoarthritis Mother   . Hypertension Mother   . Heart disease Father   . Hypertension Father   . COPD Brother     ADVANCED DIRECTIVES (Y/N):  N  HEALTH MAINTENANCE: Social History  Substance Use Topics  . Smoking status: Former Smoker    Packs/day: 1.00    Years: 45.00    Types: Cigarettes    Quit date: 04/25/1994  . Smokeless tobacco: Never Used  . Alcohol use No     Comment: 12/23/2014 "might  have a couple mixed drinks/year"     Colonoscopy:  PAP:  Bone density:  Lipid panel:  Allergies  Allergen Reactions  . Ciprofloxacin Shortness Of Breath, Itching and Rash  . Doxycycline Shortness Of Breath, Itching and Rash  . Penicillins Shortness Of Breath, Itching, Rash and Other (See Comments)    Has patient had a PCN reaction causing immediate rash, facial/tongue/throat swelling, SOB or lightheadedness with hypotension: Yes Has patient had a PCN reaction causing severe rash involving mucus membranes or skin necrosis: No Has patient had a PCN reaction that required hospitalization No Has patient had a PCN reaction occurring within the last 10 years: No If all of the above answers are "NO", then may proceed with Cephalosporin use.  . Sulfa Antibiotics Shortness Of Breath, Itching and Rash  . Latex Itching  . Morphine And Related Itching  . Albuterol Sulfate   . Cefuroxime Rash    Blisters in mouth    Current Outpatient Prescriptions  Medication Sig Dispense Refill  .  ALPRAZolam (XANAX) 0.25 MG tablet Take 1 tablet (0.25 mg total) by mouth at bedtime as needed for anxiety. 7 tablet 0  . apixaban (ELIQUIS) 2.5 MG TABS tablet Take 1 tablet (2.5 mg total) by mouth every 12 (twelve) hours. 180 tablet 3  . atorvastatin (LIPITOR) 20 MG tablet Take 1 tablet (20 mg total) by mouth every evening. 90 tablet 3  . cholecalciferol (VITAMIN D) 400 UNITS TABS tablet Take 400 Units by mouth every evening.     . cyanocobalamin 500 MCG tablet Take 500 mcg by mouth every evening.    . febuxostat (ULORIC) 40 MG tablet Take 40 mg by mouth every evening.     . fenofibrate (TRICOR) 48 MG tablet Take 48 mg by mouth every evening.     . ferrous sulfate 325 (65 FE) MG EC tablet Take 325 mg by mouth every evening.     . fluticasone furoate-vilanterol (BREO ELLIPTA) 100-25 MCG/INH AEPB Inhale 1 puff into the lungs daily.    . furosemide (LASIX) 40 MG tablet Take 1 tablet (40 mg total) by mouth daily.  (Patient taking differently: Take 20 mg by mouth daily. ) 30 tablet 6  . metoprolol succinate (TOPROL XL) 25 MG 24 hr tablet Take 25 mg by mouth daily.  60 tablet 6  . Multiple Vitamins-Minerals (PRESERVISION AREDS 2) CAPS Take 1 capsule by mouth daily.    . nitroGLYCERIN (NITROSTAT) 0.4 MG SL tablet Place 1 tablet (0.4 mg total) under the tongue every 5 (five) minutes as needed for chest pain. 25 tablet 3  . pantoprazole (PROTONIX) 40 MG tablet Take 40 mg by mouth every evening.     . sertraline (ZOLOFT) 50 MG tablet Take 1 tablet (50 mg total) by mouth daily. 30 tablet 2  . tiotropium (SPIRIVA) 18 MCG inhalation capsule Place 18 mcg into inhaler and inhale daily.     No current facility-administered medications for this visit.     OBJECTIVE: Vitals:   06/19/16 1542  BP: 122/79  Pulse: 81  Resp: 18  Temp: 98.2 F (36.8 C)     Body mass index is 30.92 kg/m.    ECOG FS:2 - Symptomatic, <50% confined to bed  General: Well-developed, well-nourished, no acute distress. Eyes: Pink conjunctiva, anicteric sclera. HEENT: Normocephalic, moist mucous membranes, clear oropharnyx. Lungs: Clear to auscultation bilaterally. Heart: Regular rate and rhythm. No rubs, murmurs, or gallops. Abdomen: Soft, nontender, nondistended. No organomegaly noted, normoactive bowel sounds. Musculoskeletal: No edema, cyanosis, or clubbing. Neuro: Alert, answering all questions appropriately. Cranial nerves grossly intact. Skin: No rashes or petechiae noted. Psych: Normal affect. Lymphatics: No cervical, calvicular, axillary or inguinal LAD.   LAB RESULTS:  Lab Results  Component Value Date   NA 142 04/13/2016   K 4.3 04/13/2016   CL 96 04/13/2016   CO2 27 04/13/2016   GLUCOSE 105 (H) 04/13/2016   BUN 38 (H) 04/13/2016   CREATININE 2.20 (H) 04/13/2016   CALCIUM 9.0 04/13/2016   PROT 5.6 (L) 03/18/2016   ALBUMIN 3.3 (L) 03/29/2016   ALBUMIN 3.3 (L) 03/29/2016   AST 29 03/18/2016   ALT 69 (H)  03/18/2016   ALKPHOS 47 03/18/2016   BILITOT 1.1 03/18/2016   GFRNONAA 21 (L) 04/13/2016   GFRAA 24 (L) 04/13/2016    Lab Results  Component Value Date   WBC 7.1 06/19/2016   NEUTROABS 5.7 06/19/2016   HGB 10.6 (L) 06/19/2016   HCT 31.7 (L) 06/19/2016   MCV 80.3 06/19/2016   PLT 179 06/19/2016  Lab Results  Component Value Date   IRON 65 06/19/2016   TIBC 538 (H) 06/19/2016   IRONPCTSAT 12 06/19/2016   Lab Results  Component Value Date   FERRITIN 56 06/19/2016     STUDIES: No results found.  ASSESSMENT: Anemia secondary to chronic renal failure, clinical stage IA adenocarcinoma of right upper lobe lung.  PLAN:    1. Anemia secondary to chronic renal failure: Patient received one injection of Procrit while in the hospital recently. Her hemoglobin is currently greater than 10.0, therefore she does not require additional Procrit. Her iron stores are within normal limits. No intervention is needed at this time. Return to clinic every 4 weeks for repeat laboratory work and consideration of Procrit if her hemoglobin falls below 10.0. Patient will then return to clinic in 4 months for further evaluation. 2. Clinical stage IA adenocarcinoma of right upper lobe lung: Patient is status post stereotactic radiation therapy. Repeat CT scan on March 07, 2016 did not reveal any recurrence or progressive disease. Given patient's multiple comorbidities and declining performance status, no further treatment is recommended. Can consider CT scan in one year to assess for interval change. 3. Chronic renal failure: Continue evaluation and treatment per nephrology.  Approximately 30 minutes was spent in discussion of which greater than 50% was consultation.  Patient expressed understanding and was in agreement with this plan. She also understands that She can call clinic at any time with any questions, concerns, or complaints.   Cancer of upper lobe of right lung [facility])   Staging form: Lung, AJCC  7th Edition   - Clinical: T1, N0, M0 - Signed by Forest Gleason, MD on 01/08/2015  Lloyd Huger, MD   06/24/2016 10:58 AM

## 2016-06-22 ENCOUNTER — Other Ambulatory Visit: Payer: Self-pay | Admitting: Oncology

## 2016-06-23 ENCOUNTER — Inpatient Hospital Stay: Payer: Medicare Other

## 2016-06-23 ENCOUNTER — Telehealth: Payer: Self-pay | Admitting: Cardiovascular Disease

## 2016-06-23 VITALS — BP 121/74 | HR 76

## 2016-06-23 DIAGNOSIS — C3411 Malignant neoplasm of upper lobe, right bronchus or lung: Secondary | ICD-10-CM | POA: Diagnosis not present

## 2016-06-23 DIAGNOSIS — D631 Anemia in chronic kidney disease: Principal | ICD-10-CM

## 2016-06-23 DIAGNOSIS — N189 Chronic kidney disease, unspecified: Secondary | ICD-10-CM

## 2016-06-23 MED ORDER — EPOETIN ALFA 40000 UNIT/ML IJ SOLN
40000.0000 [IU] | Freq: Once | INTRAMUSCULAR | Status: AC
  Administered 2016-06-23: 40000 [IU] via SUBCUTANEOUS
  Filled 2016-06-23: qty 1

## 2016-06-23 NOTE — Telephone Encounter (Signed)
Patient calling the office for samples of medication:   1.  What medication and dosage are you requesting samples for? Eliquis 2.5 mg  2.  Are you currently out of this medication?  Pt is almost out

## 2016-06-23 NOTE — Telephone Encounter (Signed)
Patient aware that samples are available of Eliquis 2.5 mg Lot# [id number] Exp. 07/2017.

## 2016-06-26 ENCOUNTER — Telehealth: Payer: Self-pay | Admitting: Internal Medicine

## 2016-06-26 NOTE — Telephone Encounter (Signed)
Spoke with pts son he stated that pt had been having palpitations for 7-10 days and associated nausea symptoms. Pt and son agreeable to see Tommye Standard 06/29/2016 at 11:00am d/t pts type of device not remote capable.

## 2016-06-26 NOTE — Telephone Encounter (Signed)
Pt sched for device check on 08/01/16. She would like to know if this needs to be checked sooner, as she feel that it is making her nauseous.

## 2016-06-26 NOTE — Telephone Encounter (Signed)
Patient son wants to know if she needs to be seen sooner than scheduled.  She recently had device insertion and feeling flutters that sometimes makes her nauseated.

## 2016-06-29 ENCOUNTER — Encounter: Payer: Self-pay | Admitting: Physician Assistant

## 2016-06-29 ENCOUNTER — Ambulatory Visit (INDEPENDENT_AMBULATORY_CARE_PROVIDER_SITE_OTHER): Payer: Medicare Other | Admitting: Physician Assistant

## 2016-06-29 VITALS — BP 138/74 | HR 84 | Ht 64.0 in | Wt 183.0 lb

## 2016-06-29 DIAGNOSIS — I481 Persistent atrial fibrillation: Secondary | ICD-10-CM | POA: Diagnosis not present

## 2016-06-29 DIAGNOSIS — R002 Palpitations: Secondary | ICD-10-CM

## 2016-06-29 DIAGNOSIS — I255 Ischemic cardiomyopathy: Secondary | ICD-10-CM

## 2016-06-29 DIAGNOSIS — I2510 Atherosclerotic heart disease of native coronary artery without angina pectoris: Secondary | ICD-10-CM

## 2016-06-29 DIAGNOSIS — I5032 Chronic diastolic (congestive) heart failure: Secondary | ICD-10-CM

## 2016-06-29 DIAGNOSIS — I4819 Other persistent atrial fibrillation: Secondary | ICD-10-CM

## 2016-06-29 NOTE — Patient Instructions (Addendum)
.  Medication Instructions:   Your physician recommends that you continue on your current medications as directed. Please refer to the Current Medication list given to you today.   If you need a refill on your cardiac medications before your next appointment, please call your pharmacy.  Labwork: NONE ORDERED  TODAY    Testing/Procedures: NONE ORDERED  TODAY   Follow-Up: KEEP APPOINTMENT AS SCHEDULED IN [place]   Any Other Special Instructions Will Be Listed Below (If Applicable).

## 2016-06-29 NOTE — Progress Notes (Signed)
Cardiology Office Note Date:  06/29/2016  Patient ID:  Regina Burke, Regina Burke Jun 10, 1933, MRN [id number] PCP:  Tracie Harrier, MD  Cardiologist:  Dr. Rockey Situ Electrophysiologist: Dr. Caryl Comes    Chief Complaint: palpitations w/nausea  History of Present Illness: TALLI Burke is a 82 y.o. female with history of CAD s/p PCI to OM branch (11/2014), persistent difficult control Afib with RVR s/p recent prior DCCV which did not hold, anemia which has led to prior intermittnet usage of anticoagulation, chronic combined CHF, chronic respiratory failure 2/2 COPD, lung cancer s/p radiation, DM2, asthma, CKD stage III, cushing's disease, hypertensive heart disease, digoxin toxicity in the setting of renal failure, LBBB, HLD, and OSA.  She comes in to the office today to be seen for Dr. Caryl Comes, last seen by him in the hospital/procedure.  She reports feeling intermittent palpitations at times making her feel nauseous.  No CP, no dizziness, near syncope or syncope.  She is chronically on O2, with baseline SOB and this limits her, she is accompanied by her son who states if anything her breathing is much improved since her AF has gotten controlled. She states at times she feels like something is bothering her on her L side, lateral to her breast, though can't seem to figure out exactly what it is.  Not pain, not tender, not positional.  She states she feels the palpitations particularly when she is upset, the son disagrees with this though and says she has mentioned it even when not upset, but admits that he plans to have her see her PMD because she is always anxious or upset about something, "she is never happy".   In reference to her a/c, she states for some time she will occasionaly have a few spots of blood from her R nare, though not new, is infrequent.   AFib Hx Difficult to control, AVN ablation 03/2015 July 2017 DCCV with ERAF only after a couple hours On/off a/c with anemia  DEVICE information:  MDT CRT-D, implanted 03/31/16, Dr. Caryl Comes   Past Medical History:  Diagnosis Date  . Anemia   . Arthritis   . Asthma   . Cardiac resynchronization therapy pacemaker (CRT-P) in place    a. 03/31/16:  Medtronic Percepta Quad CRT-P MRI SureScan (serial Number [id number]) device.  . Chronic diastolic CHF (congestive heart failure) ([place])    a. 10/2015 Echo: EF 55-65%, Gr1 DD, mild MR, mildly dil LA, nl RV fxn, nl PASP.  Marland Kitchen Chronic kidney disease (CKD), stage III (moderate)   . COPD (chronic obstructive pulmonary disease) ([place])   . Coronary artery disease    a. 11/2014 NSTEMI/PCI: LM nl, LAD 59m D1 30, LCX mild dzs, OM1 20p, OM2 282mOM3 90p (2.25x8 Promus Premier DES), RCA nl.   . Cough    CHRONIC AT NIGHT  . Cushing's disease ([place]  . Depression   . Diverticulitis   . Edema    FEET/LEGS  . GERD (gastroesophageal reflux disease)   . Gout   . History of hiatal hernia   . History of pneumonia   . HLD (hyperlipidemia)   . HOH (hard of hearing)   . Hypertension   . Hypertensive heart disease   . Lung cancer ([place] 2014   S/P radiation 2015  . Migraine   . Multiple allergies   . Persistent atrial fibrillation (HCC)    a. on amio, Toprol XL, Cardizem CD, and Eliquis 2.5 mg bid (age & SCr); b. CHADS2VASc ==> 7 (CHF, HTN,  age x 2, DM, vascular disease and sex category)  . S/P AV nodal ablation    a. on 03/31/16 for persistant afib with CRT-P placement  . Sleep apnea   . Type II diabetes mellitus ([place])     Past Surgical History:  Procedure Laterality Date  . ABDOMINAL HYSTERECTOMY    . ABLATION     July 2017  . ADRENALECTOMY Left 1980's   "Cushings"  . APPENDECTOMY    . BREAST CYST EXCISION Left   . CATARACT EXTRACTION W/PHACO Right 12/28/2015   Procedure: CATARACT EXTRACTION PHACO AND INTRAOCULAR LENS PLACEMENT (IOC);  Surgeon: Birder Robson, MD;  Location: [facility] ORS;  Service: Ophthalmology;  Laterality: Right;  [place] 48.4   . CHOLECYSTECTOMY    . CORONARY ANGIOPLASTY WITH STENT  PLACEMENT  12/23/2014  . ELECTROPHYSIOLOGIC STUDY N/A 03/08/2016   Procedure: CARDIOVERSION;  Surgeon: Wende Bushy, MD;  Location: [facility] ORS;  Service: Cardiovascular;  Laterality: N/A;  . ELECTROPHYSIOLOGIC STUDY N/A 03/07/2016   Procedure: Cardioversion;  Surgeon: Wende Bushy, MD;  Location: [facility] ORS;  Service: Cardiovascular;  Laterality: N/A;  . ELECTROPHYSIOLOGIC STUDY N/A 03/31/2016   Procedure: AV Node Ablation;  Surgeon: Will Meredith Leeds, MD;  Location: [place] [facility];  Service: Cardiovascular;  Laterality: N/A;  . EP IMPLANTABLE DEVICE N/A 03/31/2016   Procedure: BiV Pacemaker Insertion CRT-P;  Surgeon: Will Meredith Leeds, MD;  Location: [place] [facility] LAB;  Service: Cardiovascular;  Laterality: N/A;  . FRACTURE SURGERY    . INSERT / REPLACE / REMOVE PACEMAKER  02/2016  . LEFT HEART CATHETERIZATION WITH CORONARY ANGIOGRAM N/A 12/23/2014   Procedure: LEFT HEART CATHETERIZATION WITH CORONARY ANGIOGRAM;  Surgeon: Burnell Blanks, MD;  Location: [facility] CATH LAB;  Service: Cardiovascular;  Laterality: N/A;  . PERCUTANEOUS CORONARY STENT INTERVENTION (PCI-S)  12/23/2014   Procedure: PERCUTANEOUS CORONARY STENT INTERVENTION (PCI-S);  Surgeon: Burnell Blanks, MD;  Location: [facility] CATH LAB;  Service: Cardiovascular;;  Promus 2.25x8  . TONSILLECTOMY    . TRANSTHORACIC ECHOCARDIOGRAM  11/26/2015   Technically difficult study. EF 55-60%. Normal wall motion. GR 1 DD.  . TUBAL LIGATION    . WRIST FRACTURE SURGERY Bilateral ~ 2000    Current Outpatient Prescriptions  Medication Sig Dispense Refill  . ALPRAZolam (XANAX) 0.25 MG tablet Take 1 tablet (0.25 mg total) by mouth at bedtime as needed for anxiety. 7 tablet 0  . apixaban (ELIQUIS) 2.5 MG TABS tablet Take 1 tablet (2.5 mg total) by mouth every 12 (twelve) hours. 180 tablet 3  . atorvastatin (LIPITOR) 20 MG tablet Take 1 tablet (20 mg total) by mouth every evening. 90 tablet 3  . cholecalciferol (VITAMIN D) 400 UNITS TABS tablet  Take 400 Units by mouth every evening.     . cyanocobalamin 500 MCG tablet Take 500 mcg by mouth every evening.    . febuxostat (ULORIC) 40 MG tablet Take 40 mg by mouth every evening.     . fenofibrate (TRICOR) 48 MG tablet Take 48 mg by mouth every evening.     . ferrous sulfate 325 (65 FE) MG EC tablet Take 325 mg by mouth every evening.     . fluticasone furoate-vilanterol (BREO ELLIPTA) 100-25 MCG/INH AEPB Inhale 1 puff into the lungs daily.    . furosemide (LASIX) 40 MG tablet Take 1 tablet (40 mg total) by mouth daily. (Patient taking differently: Take 20 mg by mouth daily. ) 30 tablet 6  . metoprolol succinate (TOPROL XL) 25 MG 24 hr tablet Take 25  mg by mouth daily.  60 tablet 6  . Multiple Vitamins-Minerals (PRESERVISION AREDS 2) CAPS Take 1 capsule by mouth daily.    . nitroGLYCERIN (NITROSTAT) 0.4 MG SL tablet Place 1 tablet (0.4 mg total) under the tongue every 5 (five) minutes as needed for chest pain. 25 tablet 3  . pantoprazole (PROTONIX) 40 MG tablet Take 40 mg by mouth every evening.     . sertraline (ZOLOFT) 50 MG tablet Take 1 tablet (50 mg total) by mouth daily. 30 tablet 2  . tiotropium (SPIRIVA) 18 MCG inhalation capsule Place 18 mcg into inhaler and inhale daily.     No current facility-administered medications for this visit.     Allergies:   Ciprofloxacin; Doxycycline; Penicillins; Sulfa antibiotics; Latex; Morphine and related; Albuterol sulfate; and Cefuroxime   Social History:  The patient  reports that she quit smoking about 22 years ago. Her smoking use included Cigarettes. She has a 45.00 pack-year smoking history. She has never used smokeless tobacco. She reports that she does not drink alcohol or use drugs.   Family History:  The patient's family history includes COPD in her brother; Diabetes in her mother; Heart disease in her father and mother; Hypertension in her father and mother; Osteoarthritis in her mother.  ROS:  Please see the history of present  illness.  All other systems are reviewed and otherwise negative.   PHYSICAL EXAM:  VS:  BP 138/74   Pulse 84   Ht '5\' 4"'$  (1.626 m)   Wt 183 lb (83 kg)   BMI 31.41 kg/m  BMI: Body mass index is 31.41 kg/m. Well nourished, well developed, in no acute distress  HEENT: normocephalic, atraumatic  Neck: no JVD, carotid bruits or masses Cardiac: RRR (paced); no significant murmurs, no rubs, or gallops Lungs:  clear to auscultation bilaterally, no wheezing, rhonchi or rales (on O2) Abd: soft, nontender MS: no deformity or atrophy Ext: no edema  Skin: warm and dry, no rash Neuro:  No gross deficits appreciated Psych: euthymic mood, full affect  ICD site is stable, no tethering or discomfort   EKG:  Done 05/19/16 is AFib, V paced ICD interrogation today and reviewed by myself: stable battery and lead measurements.  During LV lead testing she mentioned she was feeling her palpitations.  No obvious or notable diaphragmatic stim was noted, but resolved with reprogramming LV lead  LV 2-3. (from LV 1-2) She had no high V rates, and no significant PVC's <0.1/hour.  Base rate is at 70bpm  Echo from admission on 7/22 showed a newly depressed EF of 35-40%, diffuse HK, mild MR, LA moderately dilated, PASP 73 mm Hg. After she was reasonably well rate controlled a repeat echo was done on 7/28 that showed EF 35-40%, LA severely dilated, moderately increased PASP, left pleural effusion. Recheck echo on 8/1 showed an EF of 30-35%, moderate MR, LA moderately dilated, PASP could not be estimated.  Recent Labs: 03/17/2016: B Natriuretic Peptide 548.0; Magnesium 2.1 03/18/2016: ALT 69 04/13/2016: BUN 38; Creatinine, Ser 2.20; Potassium 4.3; Sodium 142 06/19/2016: Hemoglobin 10.6; Platelets 179  No results found for requested labs within last 8760 hours.   CrCl cannot be calculated (Patient's most recent lab result is older than the maximum 21 days allowed.).   Wt Readings from Last 3 Encounters:  06/29/16 183  lb (83 kg)  06/19/16 180 lb 1.9 oz (81.7 kg)  05/22/16 178 lb (80.7 kg)     Other studies reviewed: Additional studies/records reviewed today include: summarized  above  ASSESSMENT AND PLAN:  1. Persistent AFib     Hx of AVN ablation     CHA2DS2Vasc is at least 5, on low dose Eliquis (age/renal)     Palpitations, seemed to be reproduced with LV lead testing and was reprogrammed from LV 1-2 to LV 2-3.   With this she had no symptoms while here, possibly this explains her "feeling"  That she was having intermittently on her L side?   Her base pacing rate left at 70 for now to make only one change at a time.  She would like to keep her appointment next month with Dr. Caryl Comes in [place], and will defer to then.  2. ICD     normal function, no high v rates, VT or significant PVCs  3. ICM     Exam is euvolemic, optiVol looks OK     On BB, low dose BB, no ACE/ARB likely secondary to renal function     C/w Dr. Rockey Situ  4. CAD     no symptoms     c/w Dr. Rockey Situ   Disposition: F/u with Dr. Caryl Comes as scheduled, sooner if needed.  Current medicines are reviewed at length with the patient today.  The patient did not have any concerns regarding medicines.  Haywood Lasso, PA-C 06/29/2016 1:01 PM     [address] [place] [address] [phone] (office)  [phone] (fax)

## 2016-07-04 ENCOUNTER — Encounter: Payer: Medicare Other | Admitting: Internal Medicine

## 2016-07-13 ENCOUNTER — Other Ambulatory Visit: Payer: Self-pay | Admitting: *Deleted

## 2016-07-13 DIAGNOSIS — N189 Chronic kidney disease, unspecified: Principal | ICD-10-CM

## 2016-07-13 DIAGNOSIS — D631 Anemia in chronic kidney disease: Secondary | ICD-10-CM

## 2016-07-17 ENCOUNTER — Inpatient Hospital Stay: Payer: Medicare Other

## 2016-07-17 ENCOUNTER — Inpatient Hospital Stay: Payer: Medicare Other | Attending: Oncology

## 2016-07-17 DIAGNOSIS — D631 Anemia in chronic kidney disease: Secondary | ICD-10-CM | POA: Insufficient documentation

## 2016-07-17 DIAGNOSIS — I5032 Chronic diastolic (congestive) heart failure: Secondary | ICD-10-CM | POA: Insufficient documentation

## 2016-07-17 DIAGNOSIS — K219 Gastro-esophageal reflux disease without esophagitis: Secondary | ICD-10-CM | POA: Insufficient documentation

## 2016-07-17 DIAGNOSIS — Z7901 Long term (current) use of anticoagulants: Secondary | ICD-10-CM | POA: Insufficient documentation

## 2016-07-17 DIAGNOSIS — G4730 Sleep apnea, unspecified: Secondary | ICD-10-CM | POA: Insufficient documentation

## 2016-07-17 DIAGNOSIS — M109 Gout, unspecified: Secondary | ICD-10-CM | POA: Diagnosis not present

## 2016-07-17 DIAGNOSIS — E1122 Type 2 diabetes mellitus with diabetic chronic kidney disease: Secondary | ICD-10-CM | POA: Insufficient documentation

## 2016-07-17 DIAGNOSIS — I481 Persistent atrial fibrillation: Secondary | ICD-10-CM | POA: Insufficient documentation

## 2016-07-17 DIAGNOSIS — N189 Chronic kidney disease, unspecified: Secondary | ICD-10-CM

## 2016-07-17 DIAGNOSIS — I252 Old myocardial infarction: Secondary | ICD-10-CM | POA: Insufficient documentation

## 2016-07-17 DIAGNOSIS — I2510 Atherosclerotic heart disease of native coronary artery without angina pectoris: Secondary | ICD-10-CM | POA: Insufficient documentation

## 2016-07-17 DIAGNOSIS — I130 Hypertensive heart and chronic kidney disease with heart failure and stage 1 through stage 4 chronic kidney disease, or unspecified chronic kidney disease: Secondary | ICD-10-CM | POA: Diagnosis not present

## 2016-07-17 DIAGNOSIS — Z79899 Other long term (current) drug therapy: Secondary | ICD-10-CM | POA: Insufficient documentation

## 2016-07-17 DIAGNOSIS — C3411 Malignant neoplasm of upper lobe, right bronchus or lung: Secondary | ICD-10-CM | POA: Diagnosis present

## 2016-07-17 DIAGNOSIS — M1990 Unspecified osteoarthritis, unspecified site: Secondary | ICD-10-CM | POA: Diagnosis not present

## 2016-07-17 DIAGNOSIS — J449 Chronic obstructive pulmonary disease, unspecified: Secondary | ICD-10-CM | POA: Insufficient documentation

## 2016-07-17 DIAGNOSIS — F329 Major depressive disorder, single episode, unspecified: Secondary | ICD-10-CM | POA: Diagnosis not present

## 2016-07-17 DIAGNOSIS — N183 Chronic kidney disease, stage 3 (moderate): Secondary | ICD-10-CM | POA: Diagnosis not present

## 2016-07-17 DIAGNOSIS — E785 Hyperlipidemia, unspecified: Secondary | ICD-10-CM | POA: Diagnosis not present

## 2016-07-17 DIAGNOSIS — Z923 Personal history of irradiation: Secondary | ICD-10-CM | POA: Diagnosis not present

## 2016-07-17 DIAGNOSIS — Z87891 Personal history of nicotine dependence: Secondary | ICD-10-CM | POA: Insufficient documentation

## 2016-07-17 DIAGNOSIS — Z950 Presence of cardiac pacemaker: Secondary | ICD-10-CM | POA: Diagnosis not present

## 2016-07-17 DIAGNOSIS — G43909 Migraine, unspecified, not intractable, without status migrainosus: Secondary | ICD-10-CM | POA: Diagnosis not present

## 2016-07-17 LAB — HEMOGLOBIN: Hemoglobin: 10.4 g/dL — ABNORMAL LOW (ref 12.0–16.0)

## 2016-08-01 ENCOUNTER — Other Ambulatory Visit: Payer: Self-pay | Admitting: *Deleted

## 2016-08-01 ENCOUNTER — Ambulatory Visit (INDEPENDENT_AMBULATORY_CARE_PROVIDER_SITE_OTHER): Payer: Medicare Other | Admitting: Internal Medicine

## 2016-08-01 ENCOUNTER — Encounter: Payer: Self-pay | Admitting: Internal Medicine

## 2016-08-01 ENCOUNTER — Telehealth: Payer: Self-pay | Admitting: *Deleted

## 2016-08-01 VITALS — BP 110/60 | HR 74 | Ht 65.0 in | Wt 183.0 lb

## 2016-08-01 DIAGNOSIS — I447 Left bundle-branch block, unspecified: Secondary | ICD-10-CM | POA: Diagnosis not present

## 2016-08-01 DIAGNOSIS — Z9889 Other specified postprocedural states: Secondary | ICD-10-CM

## 2016-08-01 DIAGNOSIS — I5042 Chronic combined systolic (congestive) and diastolic (congestive) heart failure: Secondary | ICD-10-CM

## 2016-08-01 DIAGNOSIS — Z950 Presence of cardiac pacemaker: Secondary | ICD-10-CM

## 2016-08-01 DIAGNOSIS — N184 Chronic kidney disease, stage 4 (severe): Secondary | ICD-10-CM

## 2016-08-01 DIAGNOSIS — I255 Ischemic cardiomyopathy: Secondary | ICD-10-CM

## 2016-08-01 DIAGNOSIS — I482 Chronic atrial fibrillation: Secondary | ICD-10-CM

## 2016-08-01 DIAGNOSIS — I4821 Permanent atrial fibrillation: Secondary | ICD-10-CM

## 2016-08-01 MED ORDER — APIXABAN 2.5 MG PO TABS: 2.5000 mg | ORAL_TABLET | Freq: Two times a day (BID) | ORAL | 3 refills | Status: DC

## 2016-08-01 NOTE — Telephone Encounter (Signed)
I have placed application in envelope to mail application to patient's home address. Pt will return application when complete.

## 2016-08-01 NOTE — Progress Notes (Signed)
Patient Care Team: Tracie Harrier, MD as PCP - General (Internal Medicine) Minna Merritts, MD as Consulting Physician (Cardiology) Alisa Graff, FNP as Nurse Practitioner (Family Medicine) Laverle Hobby, MD as Consulting Physician (Pulmonary Disease) Lavonia Dana, MD as Consulting Physician (Internal Medicine)   HPI  Regina Burke is a 82 y.o. female Seen in follow-up for atrial fibrillation with a rapid rate which she underwent CRT-D implantation 8/17 and subsequent AV junction ablation.  This was complicated in part by presumed phrenic pacing  reprogramming resolve this. She's had no further problems. She's had no pulsations or heart failure.   She also has coronary artery disease;  denies chest pain.  No swelling She is oxygen-dependent COPD  Currently has bronchitis    Past Medical History:  Diagnosis Date  . Anemia   . Arthritis   . Asthma   . Cardiac resynchronization therapy pacemaker (CRT-P) in place    a. 03/31/16:  Medtronic Percepta Quad CRT-P MRI SureScan (serial Number [id number]) device.  . Chronic diastolic CHF (congestive heart failure) ([place])    a. 10/2015 Echo: EF 55-65%, Gr1 DD, mild MR, mildly dil LA, nl RV fxn, nl PASP.  Marland Kitchen Chronic kidney disease (CKD), stage III (moderate)   . COPD (chronic obstructive pulmonary disease) ([place])   . Coronary artery disease    a. 11/2014 NSTEMI/PCI: LM nl, LAD 25m D1 30, LCX mild dzs, OM1 20p, OM2 242mOM3 90p (2.25x8 Promus Premier DES), RCA nl.   . Cough    CHRONIC AT NIGHT  . Cushing's disease ([place]  . Depression   . Diverticulitis   . Edema    FEET/LEGS  . GERD (gastroesophageal reflux disease)   . Gout   . History of hiatal hernia   . History of pneumonia   . HLD (hyperlipidemia)   . HOH (hard of hearing)   . Hypertension   . Hypertensive heart disease   . Lung cancer ([place] 2014   S/P radiation 2015  . Migraine   . Multiple allergies   . Persistent atrial fibrillation (HCC)    a.  on amio, Toprol XL, Cardizem CD, and Eliquis 2.5 mg bid (age & SCr); b. CHADS2VASc ==> 7 (CHF, HTN, age x 2, DM, vascular disease and sex category)  . S/P AV nodal ablation    a. on 03/31/16 for persistant afib with CRT-P placement  . Sleep apnea   . Type II diabetes mellitus ([place]    Past Surgical History:  Procedure Laterality Date  . ABDOMINAL HYSTERECTOMY    . ABLATION     July 2017  . ADRENALECTOMY Left 1980's   "Cushings"  . APPENDECTOMY    . BREAST CYST EXCISION Left   . CATARACT EXTRACTION W/PHACO Right 12/28/2015   Procedure: CATARACT EXTRACTION PHACO AND INTRAOCULAR LENS PLACEMENT (IOC);  Surgeon: WiBirder RobsonMD;  Location: [facility] ORS;  Service: Ophthalmology;  Laterality: Right;  [place]4   . CHOLECYSTECTOMY    . CORONARY ANGIOPLASTY WITH STENT PLACEMENT  12/23/2014  . ELECTROPHYSIOLOGIC STUDY N/A 03/08/2016   Procedure: CARDIOVERSION;  Surgeon: AiWende BushyMD;  Location: [facility] ORS;  Service: Cardiovascular;  Laterality: N/A;  . ELECTROPHYSIOLOGIC STUDY N/A 03/07/2016   Procedure: Cardioversion;  Surgeon: AiWende BushyMD;  Location: [facility] ORS;  Service: Cardiovascular;  Laterality: N/A;  . ELECTROPHYSIOLOGIC STUDY N/A 03/31/2016   Procedure: AV Node Ablation;  Surgeon: Will MaMeredith LeedsMD;  [facility];  Service: Cardiovascular;  Laterality: N/A;  . EP IMPLANTABLE DEVICE N/A 03/31/2016   Procedure: BiV Pacemaker Insertion CRT-P;  Surgeon: Will Meredith Leeds, MD;  Location: [place] [facility] LAB;  Service: Cardiovascular;  Laterality: N/A;  . FRACTURE SURGERY    . INSERT / REPLACE / REMOVE PACEMAKER  02/2016  . LEFT HEART CATHETERIZATION WITH CORONARY ANGIOGRAM N/A 12/23/2014   Procedure: LEFT HEART CATHETERIZATION WITH CORONARY ANGIOGRAM;  Surgeon: Burnell Blanks, MD;  Location: [facility] CATH LAB;  Service: Cardiovascular;  Laterality: N/A;  . PERCUTANEOUS CORONARY STENT INTERVENTION (PCI-S)  12/23/2014   Procedure: PERCUTANEOUS CORONARY STENT INTERVENTION  (PCI-S);  Surgeon: Burnell Blanks, MD;  Location: [facility] CATH LAB;  Service: Cardiovascular;;  Promus 2.25x8  . TONSILLECTOMY    . TRANSTHORACIC ECHOCARDIOGRAM  11/26/2015   Technically difficult study. EF 55-60%. Normal wall motion. GR 1 DD.  . TUBAL LIGATION    . WRIST FRACTURE SURGERY Bilateral ~ 2000    Current Outpatient Prescriptions  Medication Sig Dispense Refill  . ALPRAZolam (XANAX) 0.25 MG tablet Take 1 tablet (0.25 mg total) by mouth at bedtime as needed for anxiety. 7 tablet 0  . apixaban (ELIQUIS) 2.5 MG TABS tablet Take 1 tablet (2.5 mg total) by mouth every 12 (twelve) hours. 180 tablet 3  . atorvastatin (LIPITOR) 20 MG tablet Take 1 tablet (20 mg total) by mouth every evening. 90 tablet 3  . cholecalciferol (VITAMIN D) 400 UNITS TABS tablet Take 400 Units by mouth every evening.     . cyanocobalamin 500 MCG tablet Take 500 mcg by mouth every evening.    . febuxostat (ULORIC) 40 MG tablet Take 40 mg by mouth every evening.     . fenofibrate (TRICOR) 48 MG tablet Take 48 mg by mouth every evening.     . ferrous sulfate 325 (65 FE) MG EC tablet Take 325 mg by mouth every evening.     . fluticasone furoate-vilanterol (BREO ELLIPTA) 100-25 MCG/INH AEPB Inhale 1 puff into the lungs daily.    . furosemide (LASIX) 40 MG tablet Take 1 tablet (40 mg total) by mouth daily. (Patient taking differently: Take 20 mg by mouth daily. ) 30 tablet 6  . Multiple Vitamins-Minerals (PRESERVISION AREDS 2) CAPS Take 1 capsule by mouth daily.    . nitroGLYCERIN (NITROSTAT) 0.4 MG SL tablet Place 1 tablet (0.4 mg total) under the tongue every 5 (five) minutes as needed for chest pain. 25 tablet 3  . pantoprazole (PROTONIX) 40 MG tablet Take 40 mg by mouth every evening.     . sertraline (ZOLOFT) 50 MG tablet Take 1 tablet (50 mg total) by mouth daily. 30 tablet 2  . tiotropium (SPIRIVA) 18 MCG inhalation capsule Place 18 mcg into inhaler and inhale daily.     No current facility-administered  medications for this visit.     Allergies  Allergen Reactions  . Ciprofloxacin Shortness Of Breath, Itching and Rash  . Doxycycline Shortness Of Breath, Itching and Rash  . Penicillins Shortness Of Breath, Itching, Rash and Other (See Comments)    Has patient had a PCN reaction causing immediate rash, facial/tongue/throat swelling, SOB or lightheadedness with hypotension: Yes Has patient had a PCN reaction causing severe rash involving mucus membranes or skin necrosis: No Has patient had a PCN reaction that required hospitalization No Has patient had a PCN reaction occurring within the last 10 years: No If all of the above answers are "NO", then may proceed with Cephalosporin use.  . Sulfa Antibiotics Shortness Of Breath, Itching  and Rash  . Latex Itching  . Morphine And Related Itching  . Albuterol Sulfate   . Cefuroxime Rash    Blisters in mouth      Review of Systems negative except from HPI and PMH  Physical Exam BP 110/60 (BP Location: Left Arm, Patient Position: Sitting, Cuff Size: Normal)   Pulse 74   Ht '5\' 5"'$  (1.651 m)   Wt 183 lb (83 kg)   BMI 30.45 kg/m  Well developed and well nourished in no acute distress; wearing oxygen HENT normal E scleral and icterus clear Neck Supple JVP flat; carotids brisk and full Clear to ausculation Device pocket well healed; without hematoma or erythema.  There is no tethering Regular rate and rhythm, no murmurs gallops or rub Soft with active bowel sounds No clubbing cyanosis  Edema Alert and oriented, grossly normal motor and sensory function Skin Warm and Dry  ECG demonstrates atrial fibrillation with complete heart block and ventricular pacing  Assessment and  Plan  AV junction ablation  Atrial fibrillation-permanent  HFpEF  Pacemaker-CRT-Medtronic  Coronary artery disease status post stenting LAD 4/16  Renal insufficiency   GFR  20  (8/17)     The patient's heart failure status is stable. She is euvolemic.    Her blood pressures were running on the lower side; we will discontinue her metoprolol.   This may also improve renal function   Without symptoms of ischemia  Device was reprogrammed to try to keep a output less than 2.5 V  On Anticoagulation;  No bleeding issues.given creatinine and age she is on the appropriate dose of apixoban.  We will need to reassess her renal function.           Current medicines are reviewed at length with the patient today .  The patient does not  have concerns regarding medicines.

## 2016-08-01 NOTE — Telephone Encounter (Signed)
Requested Prescriptions   Pending Prescriptions Disp Refills  . apixaban (ELIQUIS) 2.5 MG TABS tablet 180 tablet 3    Sig: Take 1 tablet (2.5 mg total) by mouth every 12 (twelve) hours.

## 2016-08-01 NOTE — Telephone Encounter (Signed)
Lmovm to let patient know that [place] program will be expiring 08/27/16. She will need to reapply in order to receive assistance for Eliquis 2.5 mg tablet.

## 2016-08-01 NOTE — Patient Instructions (Signed)
Medication Instructions: - Your physician has recommended you make the following change in your medication:  1) stop toprol (metoprolol)  Labwork: - none ordered  Procedures/Testing: - none ordered  Follow-Up: - Your physician wants you to follow-up in: 6 months with Raquel Sarna in the [place] 9 months with Dr. Caryl Comes. You will receive a reminder letter in the mail two months in advance. If you don't receive a letter, please call our office to schedule the follow-up appointment.  Any Additional Special Instructions Will Be Listed Below (If Applicable).     If you need a refill on your cardiac medications before your next appointment, please call your pharmacy.

## 2016-08-03 ENCOUNTER — Telehealth: Payer: Self-pay | Admitting: Internal Medicine

## 2016-08-03 NOTE — Telephone Encounter (Signed)
I called and spoke with the patient per Dr. Olin Pia request. After her visit with him on 08/01/16, he noticed that her renal function had not been checked since 9/17. I inquired of the patient if she has had this checked more recent than September. She states she saw her PCP yesterday and he stated her kidneys "look good."  She does have an appointment with Dr. Garen Lah in renal this month. We will not bring her in for any additional labs at this time.

## 2016-08-14 ENCOUNTER — Inpatient Hospital Stay: Payer: Medicare Other | Attending: Oncology

## 2016-08-14 ENCOUNTER — Inpatient Hospital Stay: Payer: Medicare Other

## 2016-08-14 VITALS — BP 115/73 | HR 73

## 2016-08-14 DIAGNOSIS — I5032 Chronic diastolic (congestive) heart failure: Secondary | ICD-10-CM | POA: Diagnosis not present

## 2016-08-14 DIAGNOSIS — N189 Chronic kidney disease, unspecified: Secondary | ICD-10-CM

## 2016-08-14 DIAGNOSIS — G43909 Migraine, unspecified, not intractable, without status migrainosus: Secondary | ICD-10-CM | POA: Diagnosis not present

## 2016-08-14 DIAGNOSIS — I2510 Atherosclerotic heart disease of native coronary artery without angina pectoris: Secondary | ICD-10-CM | POA: Insufficient documentation

## 2016-08-14 DIAGNOSIS — N183 Chronic kidney disease, stage 3 (moderate): Secondary | ICD-10-CM | POA: Diagnosis not present

## 2016-08-14 DIAGNOSIS — M109 Gout, unspecified: Secondary | ICD-10-CM | POA: Diagnosis not present

## 2016-08-14 DIAGNOSIS — I252 Old myocardial infarction: Secondary | ICD-10-CM | POA: Diagnosis not present

## 2016-08-14 DIAGNOSIS — J449 Chronic obstructive pulmonary disease, unspecified: Secondary | ICD-10-CM | POA: Insufficient documentation

## 2016-08-14 DIAGNOSIS — F329 Major depressive disorder, single episode, unspecified: Secondary | ICD-10-CM | POA: Diagnosis not present

## 2016-08-14 DIAGNOSIS — Z87891 Personal history of nicotine dependence: Secondary | ICD-10-CM | POA: Insufficient documentation

## 2016-08-14 DIAGNOSIS — E1122 Type 2 diabetes mellitus with diabetic chronic kidney disease: Secondary | ICD-10-CM | POA: Insufficient documentation

## 2016-08-14 DIAGNOSIS — C3411 Malignant neoplasm of upper lobe, right bronchus or lung: Secondary | ICD-10-CM | POA: Insufficient documentation

## 2016-08-14 DIAGNOSIS — Z79899 Other long term (current) drug therapy: Secondary | ICD-10-CM | POA: Diagnosis not present

## 2016-08-14 DIAGNOSIS — Z950 Presence of cardiac pacemaker: Secondary | ICD-10-CM | POA: Insufficient documentation

## 2016-08-14 DIAGNOSIS — M1990 Unspecified osteoarthritis, unspecified site: Secondary | ICD-10-CM | POA: Insufficient documentation

## 2016-08-14 DIAGNOSIS — G4730 Sleep apnea, unspecified: Secondary | ICD-10-CM | POA: Diagnosis not present

## 2016-08-14 DIAGNOSIS — E785 Hyperlipidemia, unspecified: Secondary | ICD-10-CM | POA: Diagnosis not present

## 2016-08-14 DIAGNOSIS — K219 Gastro-esophageal reflux disease without esophagitis: Secondary | ICD-10-CM | POA: Insufficient documentation

## 2016-08-14 DIAGNOSIS — Z923 Personal history of irradiation: Secondary | ICD-10-CM | POA: Insufficient documentation

## 2016-08-14 DIAGNOSIS — D631 Anemia in chronic kidney disease: Secondary | ICD-10-CM | POA: Insufficient documentation

## 2016-08-14 DIAGNOSIS — I481 Persistent atrial fibrillation: Secondary | ICD-10-CM | POA: Insufficient documentation

## 2016-08-14 DIAGNOSIS — Z7901 Long term (current) use of anticoagulants: Secondary | ICD-10-CM | POA: Diagnosis not present

## 2016-08-14 DIAGNOSIS — I130 Hypertensive heart and chronic kidney disease with heart failure and stage 1 through stage 4 chronic kidney disease, or unspecified chronic kidney disease: Secondary | ICD-10-CM | POA: Diagnosis not present

## 2016-08-14 LAB — HEMOGLOBIN: Hemoglobin: 9.2 g/dL — ABNORMAL LOW (ref 12.0–16.0)

## 2016-08-14 MED ORDER — EPOETIN ALFA 40000 UNIT/ML IJ SOLN
40000.0000 [IU] | Freq: Once | INTRAMUSCULAR | Status: AC
  Administered 2016-08-14: 40000 [IU] via SUBCUTANEOUS
  Filled 2016-08-14: qty 1

## 2016-08-25 DIAGNOSIS — R42 Dizziness and giddiness: Secondary | ICD-10-CM | POA: Insufficient documentation

## 2016-08-30 ENCOUNTER — Other Ambulatory Visit: Payer: Self-pay | Admitting: Internal Medicine

## 2016-08-31 ENCOUNTER — Other Ambulatory Visit: Payer: Self-pay | Admitting: *Deleted

## 2016-09-05 IMAGING — CT CT MAXILLOFACIAL W/O CM
3 of 12 series · 15 of 47 positions shown, 17 images · non-contrast
Comparison: PET-CT 06/01/2014.  No recent similar comparison exam.

CLINICAL DATA: Tripped and fell, facial trauma, right periorbital
hematoma and epistaxis. On Plavix and aspirin.

EXAM:
CT HEAD WITHOUT CONTRAST
CT MAXILLOFACIAL WITHOUT CONTRAST
CT CERVICAL SPINE WITHOUT CONTRAST
TECHNIQUE: Multidetector CT imaging of the head, cervical spine, and
maxillofacial structures were performed using the standard protocol
without intravenous contrast. Multiplanar CT image reconstructions
of the cervical spine and maxillofacial structures were also
generated.

[Series 3: max soft · axial · 0.31mm/px · z∈[-98,+18]mm · 6 of 82 slices shown]
[im 12/82  brain]
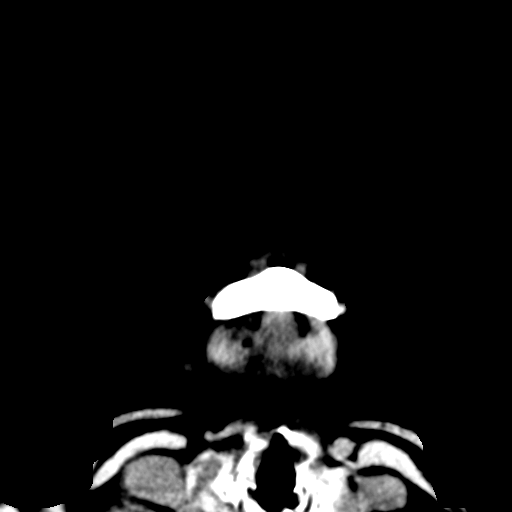
[im 24/82  brain]
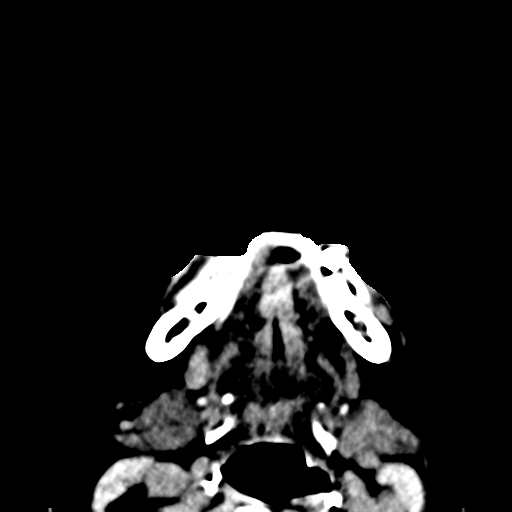
[im 35/82  brain]
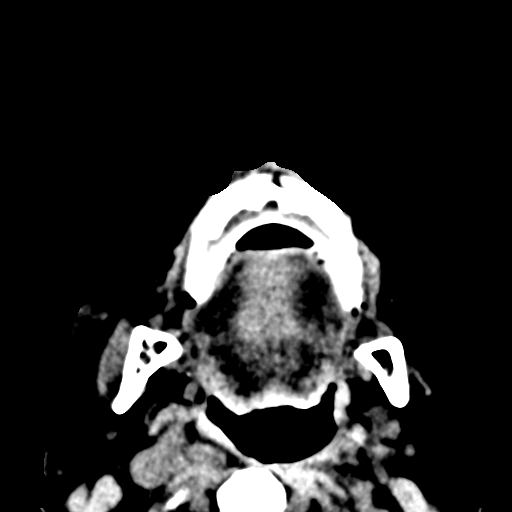
[im 47/82  brain]
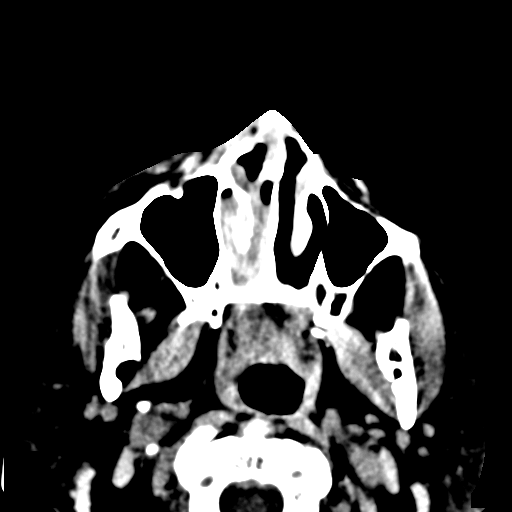
[im 58/82  brain]
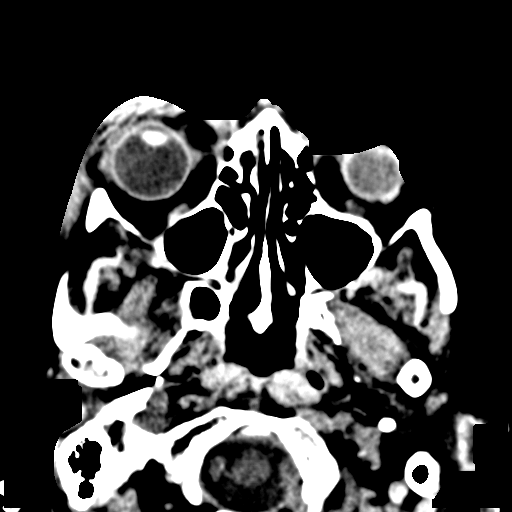
[im 70/82  brain]
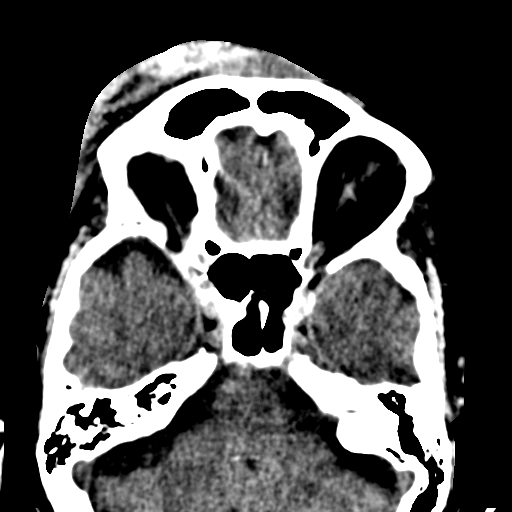

[Series 11: coronal bone · coronal · 0.22mm/px · 2 of 54 slices shown]
[im 18/54  bone]
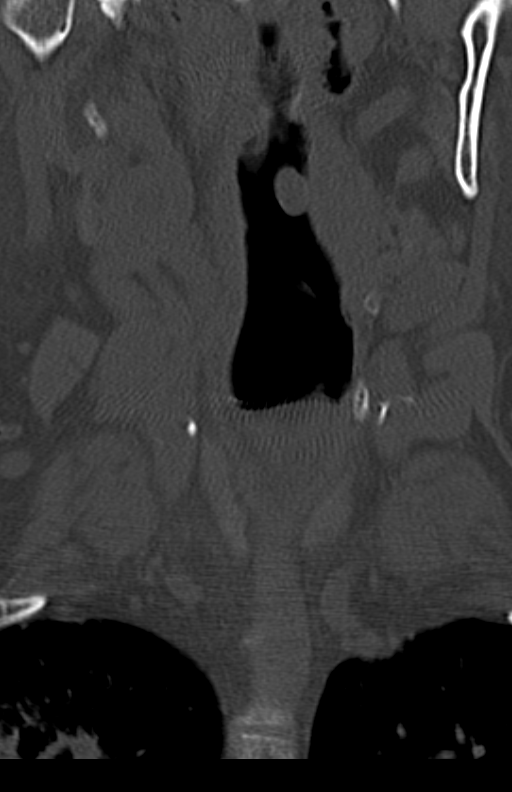
[im 36/54  bone]
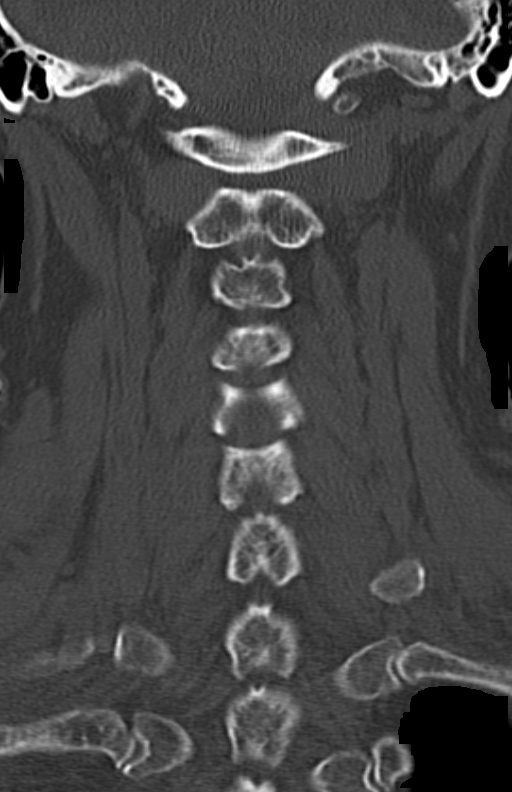

[Series 12: axial · axial · 0.26mm/px · z∈[-151,-34]mm · 7 of 88 slices shown, 9 images]
[im 11/88  brain]
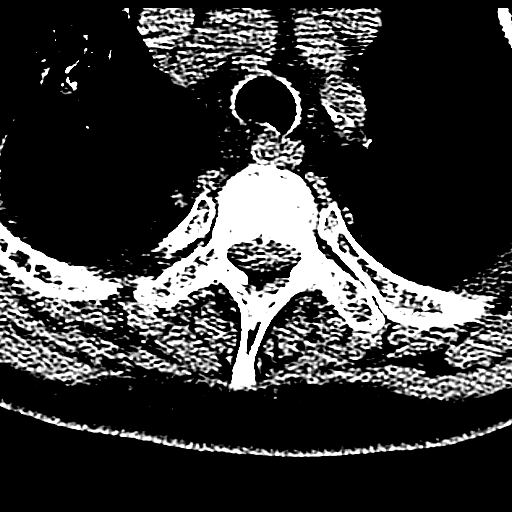
[im 11/88  bone]
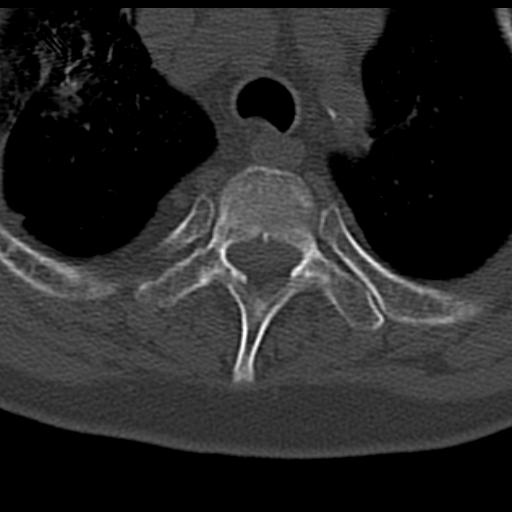
[im 22/88  bone]
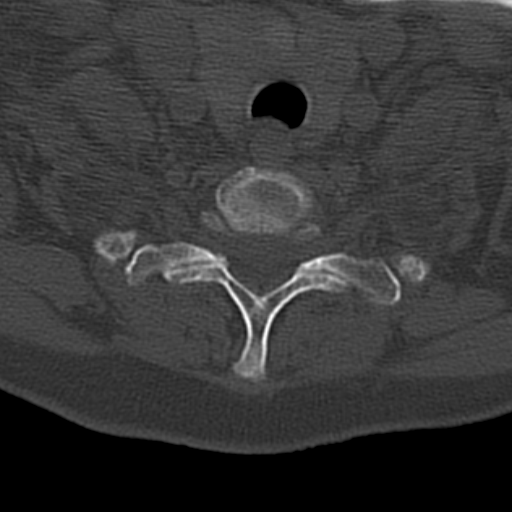
[im 33/88  bone]
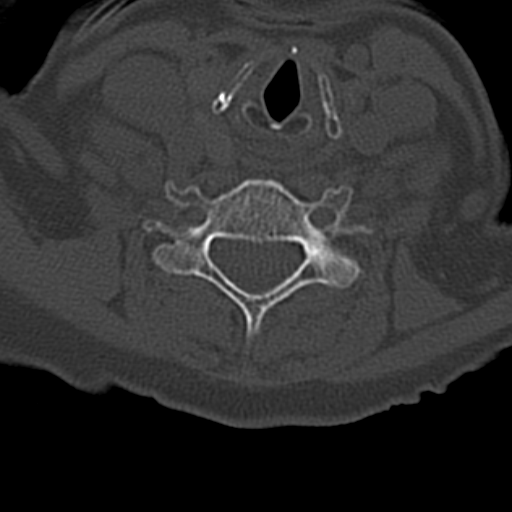
[im 44/88  bone]
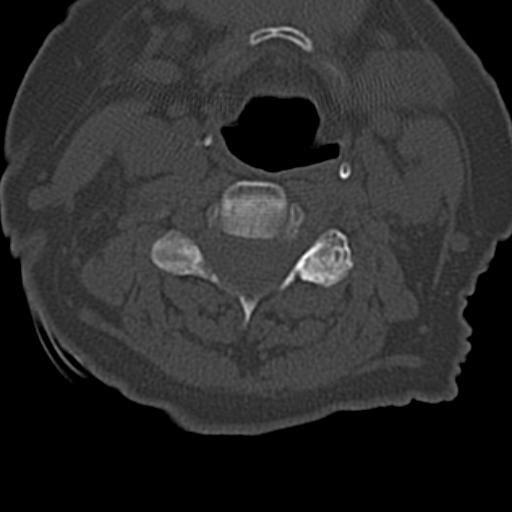
[im 55/88  brain]
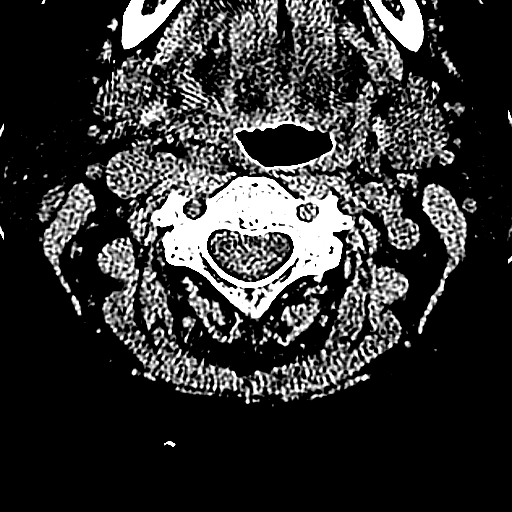
[im 55/88  bone]
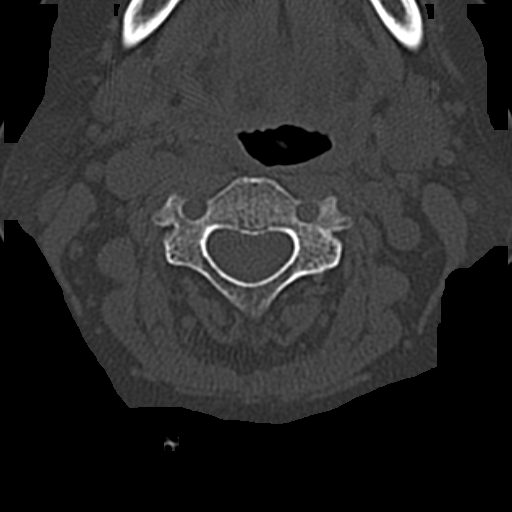
[im 66/88  bone]
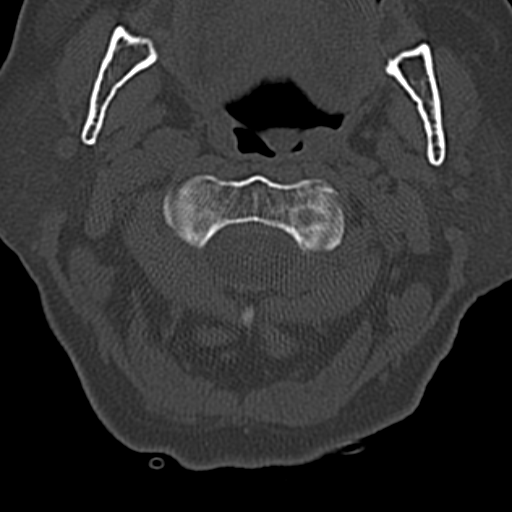
[im 77/88  bone]
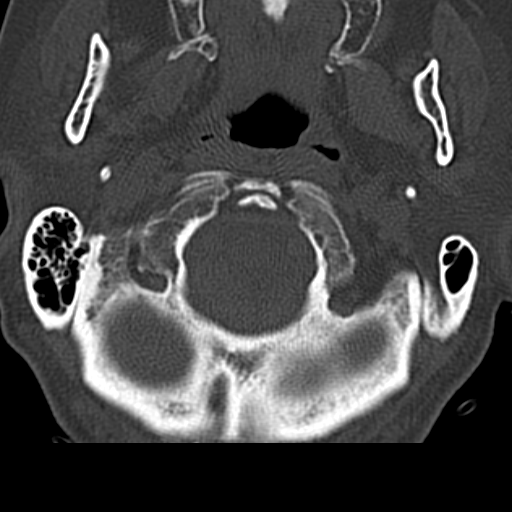

[15 of 47 positions shown; findings below may reference images not displayed]

FINDINGS: CT HEAD FINDINGS

Findings referable to the face are described in detail below.

Diffuse cortical volume loss with proportional ventricular
prominence. No acute hemorrhage, infarct, or mass lesion is
identified. No midline shift. Right frontal scalp hematoma and
presumed evidence of previous right frontoparietal burr hole. No
skull fracture involving the cranium.

CT MAXILLOFACIAL FINDINGS

Large right periorbital hematoma. Retrobulbar are fat is clean.
Comminuted nasal bone fractures with associated gas and gas/fluid
within the right nasopharynx. Paranasal sinuses are intact.
Mandibular condyles are properly located. Globes are grossly
unremarkable. Patient is partly edentulous. Zygomatic arches are
intact.

CT CERVICAL SPINE FINDINGS

C1 through the cervicothoracic junction is visualized in its
entirety. No precervical soft tissue widening. Mild loss of the
normal cervical lordosis is noted centered at C5-C6. Minimal
posterior spurring at this level is identified related to disc
degenerative change. Vertebral body heights are maintained. No
fracture or dislocation. Multilevel facet osteoarthritic change.
Right upper lobe spiculated mass partly visualized, not further
evaluated compared to previous diagnostic exam but subjectively
increased from 06/01/2014 exam.
IMPRESSION: Large right periorbital soft tissue hematoma with comminuted nasal
bone fractures.

No acute intracranial abnormality.

No acute cervical spine abnormality.

Incomplete imaging of the lung apices demonstrates a partly imaged
spiculated right upper lobe mass as seen on the prior exam and
compatible with the history of lung cancer but not further
specifically evaluated today, and could represent progression of
disease although radiation fibrosis or other entities could appear
similar. Outpatient nonemergent restaging is recommended.

## 2016-09-11 ENCOUNTER — Inpatient Hospital Stay: Payer: Medicare Other

## 2016-09-13 IMAGING — CR DG ABDOMEN 1V
1 series · 1 of 1 positions shown · non-contrast
Comparison: None.

CLINICAL DATA: Encounter for orogastric tube placement.

EXAM:
ABDOMEN - 1 VIEW

[ap]
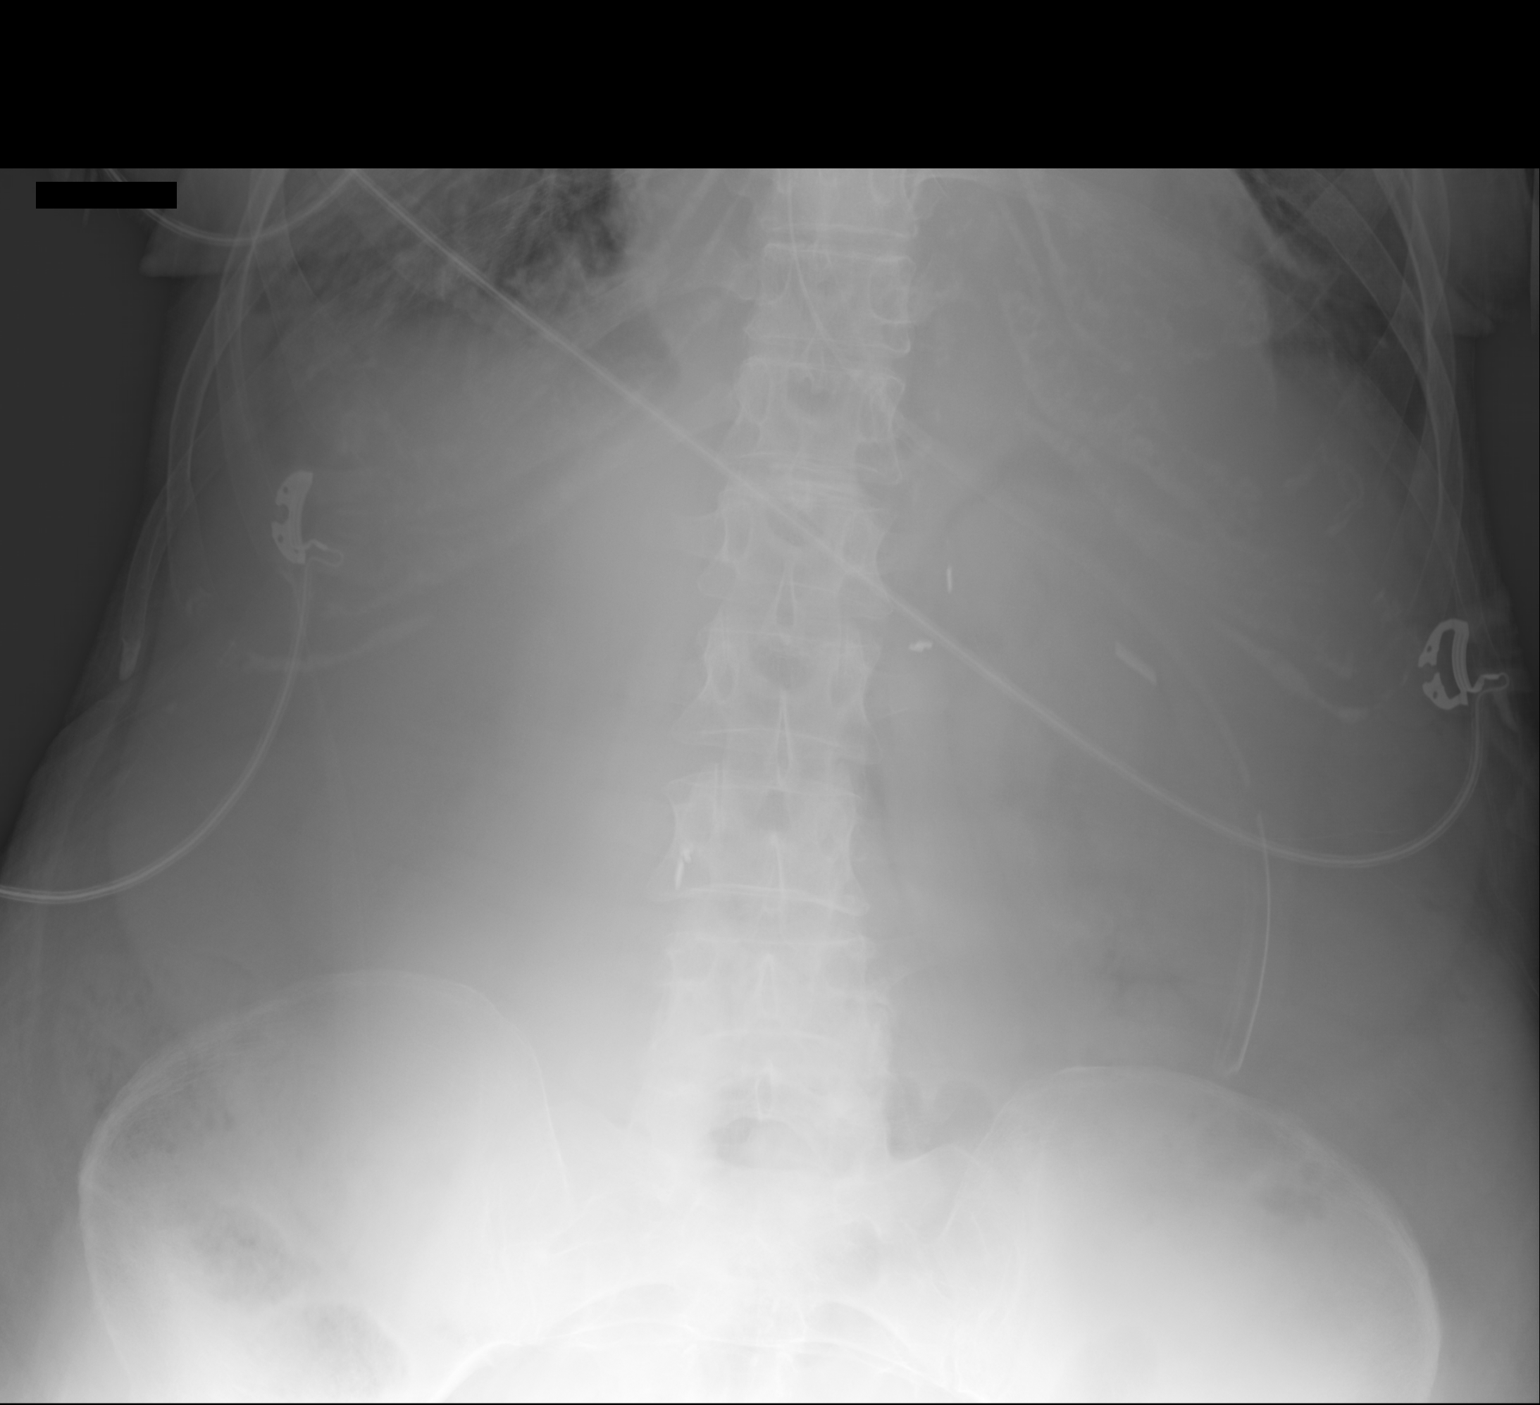

[1 of 1 positions shown; findings below may reference images not displayed]

FINDINGS: Tip of the orogastric tube extends well below the left hemidiaphragm
consistent with it being in the mid stomach.

There are surgical vascular clips in left upper quadrant.

Normal bowel gas pattern.
IMPRESSION: Orogastric tube tip projects within the mid stomach.

## 2016-09-14 IMAGING — CR DG CHEST 1V PORT
1 series · 1 of 1 positions shown · non-contrast
Comparison: None.

CLINICAL DATA: Respiratory failure.

EXAM:
PORTABLE CHEST - 1 VIEW

[ap]
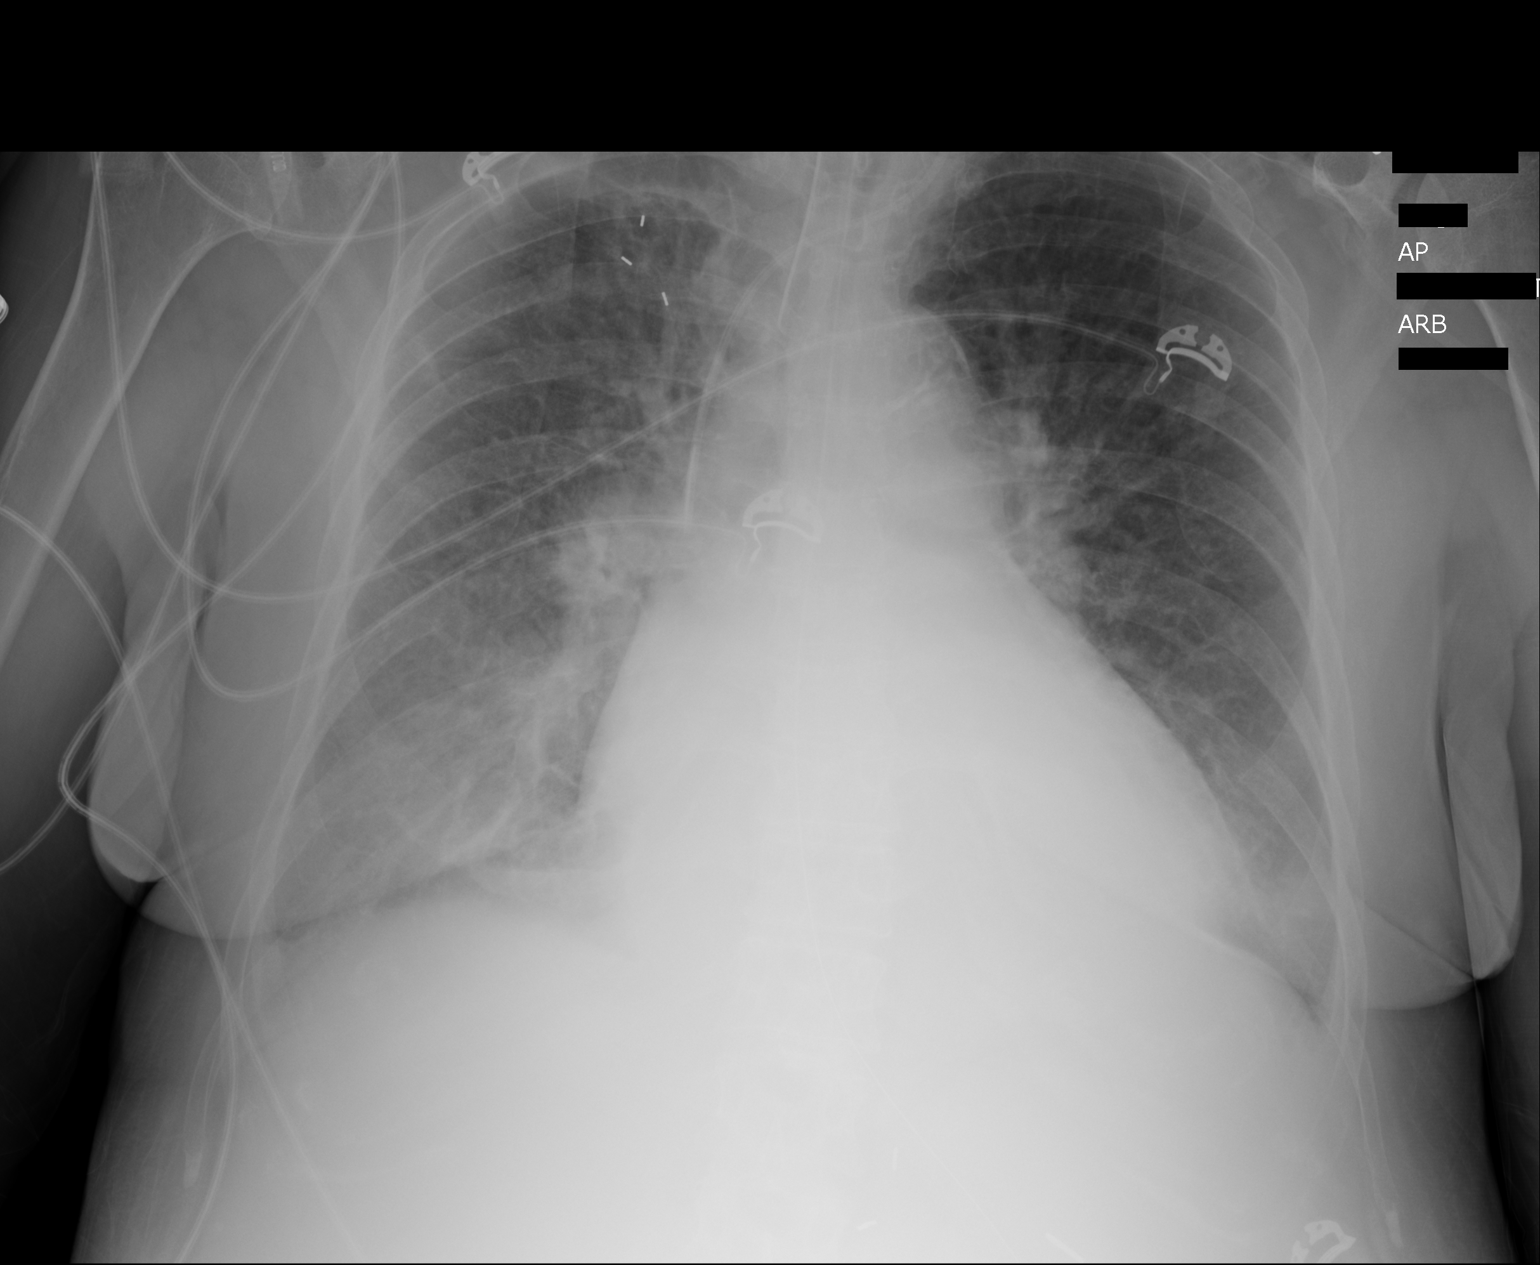

[1 of 1 positions shown; findings below may reference images not displayed]

FINDINGS: Endotracheal tube, NG line, left IJ line in stable position.
Cardiomegaly with pulmonary vascular prominence and bilateral
interstitial prominence consistent with congestive heart failure.
Interval partial clearing of pulmonary interstitial edema. Small
bilateral pleural effusions. Surgical clips right upper abdomen.
IMPRESSION: 1. Lines and tubes in stable position.
2. Congestive heart failure with partial clearing of bilateral
pulmonary interstitial edema. Small bilateral pleural effusions

## 2016-09-20 ENCOUNTER — Inpatient Hospital Stay: Payer: Medicare Other | Attending: Oncology | Admitting: *Deleted

## 2016-09-20 ENCOUNTER — Inpatient Hospital Stay: Payer: Medicare Other

## 2016-09-20 VITALS — BP 128/76 | HR 79

## 2016-09-20 DIAGNOSIS — N189 Chronic kidney disease, unspecified: Secondary | ICD-10-CM

## 2016-09-20 DIAGNOSIS — Z7901 Long term (current) use of anticoagulants: Secondary | ICD-10-CM | POA: Diagnosis not present

## 2016-09-20 DIAGNOSIS — Z87891 Personal history of nicotine dependence: Secondary | ICD-10-CM | POA: Insufficient documentation

## 2016-09-20 DIAGNOSIS — E1122 Type 2 diabetes mellitus with diabetic chronic kidney disease: Secondary | ICD-10-CM | POA: Diagnosis not present

## 2016-09-20 DIAGNOSIS — G4730 Sleep apnea, unspecified: Secondary | ICD-10-CM | POA: Insufficient documentation

## 2016-09-20 DIAGNOSIS — I5032 Chronic diastolic (congestive) heart failure: Secondary | ICD-10-CM | POA: Insufficient documentation

## 2016-09-20 DIAGNOSIS — D631 Anemia in chronic kidney disease: Secondary | ICD-10-CM | POA: Diagnosis not present

## 2016-09-20 DIAGNOSIS — Z923 Personal history of irradiation: Secondary | ICD-10-CM | POA: Diagnosis not present

## 2016-09-20 DIAGNOSIS — J449 Chronic obstructive pulmonary disease, unspecified: Secondary | ICD-10-CM | POA: Insufficient documentation

## 2016-09-20 DIAGNOSIS — I130 Hypertensive heart and chronic kidney disease with heart failure and stage 1 through stage 4 chronic kidney disease, or unspecified chronic kidney disease: Secondary | ICD-10-CM | POA: Diagnosis not present

## 2016-09-20 DIAGNOSIS — Z79899 Other long term (current) drug therapy: Secondary | ICD-10-CM | POA: Diagnosis not present

## 2016-09-20 DIAGNOSIS — G43909 Migraine, unspecified, not intractable, without status migrainosus: Secondary | ICD-10-CM | POA: Insufficient documentation

## 2016-09-20 DIAGNOSIS — K219 Gastro-esophageal reflux disease without esophagitis: Secondary | ICD-10-CM | POA: Insufficient documentation

## 2016-09-20 DIAGNOSIS — I481 Persistent atrial fibrillation: Secondary | ICD-10-CM | POA: Diagnosis not present

## 2016-09-20 DIAGNOSIS — Z950 Presence of cardiac pacemaker: Secondary | ICD-10-CM | POA: Diagnosis not present

## 2016-09-20 DIAGNOSIS — C3411 Malignant neoplasm of upper lobe, right bronchus or lung: Secondary | ICD-10-CM | POA: Insufficient documentation

## 2016-09-20 DIAGNOSIS — F329 Major depressive disorder, single episode, unspecified: Secondary | ICD-10-CM | POA: Insufficient documentation

## 2016-09-20 DIAGNOSIS — I252 Old myocardial infarction: Secondary | ICD-10-CM | POA: Diagnosis not present

## 2016-09-20 DIAGNOSIS — N183 Chronic kidney disease, stage 3 (moderate): Secondary | ICD-10-CM | POA: Diagnosis not present

## 2016-09-20 DIAGNOSIS — M109 Gout, unspecified: Secondary | ICD-10-CM | POA: Insufficient documentation

## 2016-09-20 DIAGNOSIS — M1990 Unspecified osteoarthritis, unspecified site: Secondary | ICD-10-CM | POA: Insufficient documentation

## 2016-09-20 DIAGNOSIS — I2510 Atherosclerotic heart disease of native coronary artery without angina pectoris: Secondary | ICD-10-CM | POA: Insufficient documentation

## 2016-09-20 DIAGNOSIS — E785 Hyperlipidemia, unspecified: Secondary | ICD-10-CM | POA: Insufficient documentation

## 2016-09-20 LAB — RENAL FUNCTION PANEL
ANION GAP: 9 (ref 5–15)
Albumin: 3.9 g/dL (ref 3.5–5.0)
BUN: 65 mg/dL — ABNORMAL HIGH (ref 6–20)
CHLORIDE: 101 mmol/L (ref 101–111)
CO2: 27 mmol/L (ref 22–32)
Calcium: 8.3 mg/dL — ABNORMAL LOW (ref 8.9–10.3)
Creatinine, Ser: 2.59 mg/dL — ABNORMAL HIGH (ref 0.44–1.00)
GFR, EST AFRICAN AMERICAN: 19 mL/min — AB (ref >60)
GFR, EST NON AFRICAN AMERICAN: 16 mL/min — AB (ref >60)
Glucose, Bld: 113 mg/dL — ABNORMAL HIGH (ref 65–99)
POTASSIUM: 4.1 mmol/L (ref 3.5–5.1)
Phosphorus: 4.1 mg/dL (ref 2.5–4.6)
Sodium: 137 mmol/L (ref 135–145)

## 2016-09-20 LAB — HEMOGLOBIN: HEMOGLOBIN: 9.2 g/dL — AB (ref 12.0–16.0)

## 2016-09-20 MED ORDER — EPOETIN ALFA 40000 UNIT/ML IJ SOLN
40000.0000 [IU] | Freq: Once | INTRAMUSCULAR | Status: AC
  Administered 2016-09-20: 40000 [IU] via SUBCUTANEOUS
  Filled 2016-09-20: qty 1

## 2016-09-21 LAB — PARATHYROID HORMONE, INTACT (NO CA): PTH: 136 pg/mL — ABNORMAL HIGH (ref 15–65)

## 2016-09-22 NOTE — Telephone Encounter (Signed)
Pt has been approved for [employer] 09/21/16-08/27/17.

## 2016-10-04 ENCOUNTER — Other Ambulatory Visit: Payer: Self-pay | Admitting: *Deleted

## 2016-10-04 DIAGNOSIS — D631 Anemia in chronic kidney disease: Principal | ICD-10-CM

## 2016-10-04 DIAGNOSIS — N189 Chronic kidney disease, unspecified: Secondary | ICD-10-CM

## 2016-10-08 NOTE — Progress Notes (Signed)
[place]  [phone]) [phone] Fax:(336) [phone]  ID: Regina Burke OB: 30-Apr-1933  MR#: [id number]  PIR#:[id number]  Patient Care Team: Tracie Harrier, MD as PCP - General (Internal Medicine) Minna Merritts, MD as Consulting Physician (Cardiology) Alisa Graff, FNP as Nurse Practitioner (Family Medicine) Laverle Hobby, MD as Consulting Physician (Pulmonary Disease) Lavonia Dana, MD as Consulting Physician (Internal Medicine)  CHIEF COMPLAINT: Anemia secondary to chronic renal failure, clinical stage IA adenocarcinoma of right upper lobe lung.  INTERVAL HISTORY: Patient returns to clinic today for repeat laboratory work and further evaluation. She continues to have chronic weakness and fatigue which is unchanged. She also has mild dizziness. She has no other neurologic complaints. She denies any recent fevers. She denies any chest pain, but has chronic shortness of breath and requires oxygen 24 hours per day. She has no nausea, vomiting, constipation, or diarrhea. She has no urinary complaints. Patient offers no further specific complaints today.  REVIEW OF SYSTEMS:   Review of Systems  Constitutional: Positive for malaise/fatigue. Negative for fever and weight loss.  Respiratory: Positive for shortness of breath. Negative for cough and hemoptysis.   Cardiovascular: Negative.  Negative for chest pain and leg swelling.  Gastrointestinal: Negative.  Negative for abdominal pain, blood in stool and melena.  Genitourinary: Negative.   Musculoskeletal: Negative.   Neurological: Positive for dizziness and weakness.  Psychiatric/Behavioral: Negative.  The patient is not nervous/anxious.     As per HPI. Otherwise, a complete review of systems is negative.  PAST MEDICAL HISTORY: Past Medical History:  Diagnosis Date  . Anemia   . Arthritis   . Asthma   . Cardiac resynchronization therapy pacemaker (CRT-P) in place    a. 03/31/16:  Medtronic  Percepta Quad CRT-P MRI SureScan (serial Number [id number]) device.  . Chronic diastolic CHF (congestive heart failure) ([place])    a. 10/2015 Echo: EF 55-65%, Gr1 DD, mild MR, mildly dil LA, nl RV fxn, nl PASP.  Marland Kitchen Chronic kidney disease (CKD), stage III (moderate)   . COPD (chronic obstructive pulmonary disease) ([place])   . Coronary artery disease    a. 11/2014 NSTEMI/PCI: LM nl, LAD 69m D1 30, LCX mild dzs, OM1 20p, OM2 227mOM3 90p (2.25x8 Promus Premier DES), RCA nl.   . Cough    CHRONIC AT NIGHT  . Cushing's disease ([facility]Wilson  . Depression   . Diverticulitis   . Edema    FEET/LEGS  . GERD (gastroesophageal reflux disease)   . Gout   . History of hiatal hernia   . History of pneumonia   . HLD (hyperlipidemia)   . HOH (hard of hearing)   . Hypertension   . Hypertensive heart disease   . Lung cancer (HCCallawaydx'd 2014   S/P radiation 2015  . Migraine   . Multiple allergies   . Persistent atrial fibrillation ([facility])    a. on amio, Toprol XL, Cardizem CD, and Eliquis 2.5 mg bid (age & SCr); b. CHADS2VASc ==> 7 (CHF, HTN, age x 2, DM, vascular disease and sex category)  . S/P AV nodal ablation    a. on 03/31/16 for persistant afib with CRT-P placement  . Sleep apnea   . Type II diabetes mellitus ([facility]ron    PAST SURGICAL HISTORY: Past Surgical History:  Procedure Laterality Date  . ABDOMINAL HYSTERECTOMY    . ABLATION     July 2017  . ADRENALECTOMY Left 1980's   "[place]"  . APPENDECTOMY    .  BREAST CYST EXCISION Left   . CATARACT EXTRACTION W/PHACO Right 12/28/2015   Procedure: CATARACT EXTRACTION PHACO AND INTRAOCULAR LENS PLACEMENT (IOC);  Surgeon: Birder Robson, MD;  Location: [facility] ORS;  Service: Ophthalmology;  Laterality: Right;  [place] 48.4   . CHOLECYSTECTOMY    . CORONARY ANGIOPLASTY WITH STENT PLACEMENT  12/23/2014  . ELECTROPHYSIOLOGIC STUDY N/A 03/08/2016   Procedure: CARDIOVERSION;  Surgeon: Wende Bushy, MD;  Location: [facility] ORS;  Service: Cardiovascular;  Laterality: N/A;    . ELECTROPHYSIOLOGIC STUDY N/A 03/07/2016   Procedure: Cardioversion;  Surgeon: Wende Bushy, MD;  Location: [facility] ORS;  Service: Cardiovascular;  Laterality: N/A;  . ELECTROPHYSIOLOGIC STUDY N/A 03/31/2016   Procedure: AV Node Ablation;  Surgeon: Will Meredith Leeds, MD;  Location: [place] [facility];  Service: Cardiovascular;  Laterality: N/A;  . EP IMPLANTABLE DEVICE N/A 03/31/2016   Procedure: BiV Pacemaker Insertion CRT-P;  Surgeon: Will Meredith Leeds, MD;  Location: [place] [facility];  Service: Cardiovascular;  Laterality: N/A;  . FRACTURE SURGERY    . INSERT / REPLACE / REMOVE PACEMAKER  02/2016  . LEFT HEART CATHETERIZATION WITH CORONARY ANGIOGRAM N/A 12/23/2014   Procedure: LEFT HEART CATHETERIZATION WITH CORONARY ANGIOGRAM;  Surgeon: Burnell Blanks, MD;  Location: [facility] CATH LAB;  Service: Cardiovascular;  Laterality: N/A;  . PERCUTANEOUS CORONARY STENT INTERVENTION (PCI-S)  12/23/2014   Procedure: PERCUTANEOUS CORONARY STENT INTERVENTION (PCI-S);  Surgeon: Burnell Blanks, MD;  Location: [facility] CATH LAB;  Service: Cardiovascular;;  Promus 2.25x8  . TONSILLECTOMY    . TRANSTHORACIC ECHOCARDIOGRAM  11/26/2015   Technically difficult study. EF 55-60%. Normal wall motion. GR 1 DD.  . TUBAL LIGATION    . WRIST FRACTURE SURGERY Bilateral ~ 2000    FAMILY HISTORY: Family History  Problem Relation Age of Onset  . Heart disease Mother   . Diabetes Mother   . Osteoarthritis Mother   . Hypertension Mother   . Heart disease Father   . Hypertension Father   . COPD Brother     ADVANCED DIRECTIVES (Y/N):  N  HEALTH MAINTENANCE: Social History  Substance Use Topics  . Smoking status: Former Smoker    Packs/day: 1.00    Years: 45.00    Types: Cigarettes    Quit date: 04/25/1994  . Smokeless tobacco: Never Used  . Alcohol use No     Comment: 12/23/2014 "might have a couple mixed drinks/year"     Colonoscopy:  PAP:  Bone density:  Lipid panel:  Allergies  Allergen  Reactions  . Ciprofloxacin Shortness Of Breath, Itching and Rash  . Doxycycline Shortness Of Breath, Itching and Rash  . Penicillins Shortness Of Breath, Itching, Rash and Other (See Comments)    Has patient had a PCN reaction causing immediate rash, facial/tongue/throat swelling, SOB or lightheadedness with hypotension: Yes Has patient had a PCN reaction causing severe rash involving mucus membranes or skin necrosis: No Has patient had a PCN reaction that required hospitalization No Has patient had a PCN reaction occurring within the last 10 years: No If all of the above answers are "NO", then may proceed with Cephalosporin use.  . Sulfa Antibiotics Shortness Of Breath, Itching and Rash  . Latex Itching  . Morphine And Related Itching  . Albuterol Sulfate   . Cefuroxime Rash    Blisters in mouth    Current Outpatient Prescriptions  Medication Sig Dispense Refill  . ALPRAZolam (XANAX) 0.25 MG tablet Take 1 tablet (0.25 mg total) by mouth at bedtime as needed for anxiety.  7 tablet 0  . apixaban (ELIQUIS) 2.5 MG TABS tablet Take 1 tablet (2.5 mg total) by mouth every 12 (twelve) hours. 180 tablet 3  . atorvastatin (LIPITOR) 20 MG tablet Take 1 tablet (20 mg total) by mouth every evening. 90 tablet 3  . cholecalciferol (VITAMIN D) 400 UNITS TABS tablet Take 400 Units by mouth every evening.     . cyanocobalamin 500 MCG tablet Take 500 mcg by mouth every evening.    . febuxostat (ULORIC) 40 MG tablet Take 40 mg by mouth every evening.     . fenofibrate (TRICOR) 48 MG tablet Take 48 mg by mouth every evening.     . ferrous sulfate 325 (65 FE) MG EC tablet Take 325 mg by mouth every evening.     . fluticasone furoate-vilanterol (BREO ELLIPTA) 100-25 MCG/INH AEPB Inhale 1 puff into the lungs daily.    . furosemide (LASIX) 40 MG tablet Take 1 tablet (40 mg total) by mouth daily. (Patient taking differently: Take 20 mg by mouth daily. ) 30 tablet 6  . Multiple Vitamins-Minerals (PRESERVISION  AREDS 2) CAPS Take 1 capsule by mouth daily.    . nitroGLYCERIN (NITROSTAT) 0.4 MG SL tablet Place 1 tablet (0.4 mg total) under the tongue every 5 (five) minutes as needed for chest pain. 25 tablet 3  . pantoprazole (PROTONIX) 40 MG tablet Take 40 mg by mouth every evening.     . sertraline (ZOLOFT) 50 MG tablet Take 1 tablet (50 mg total) by mouth daily. 30 tablet 2  . tiotropium (SPIRIVA) 18 MCG inhalation capsule Place 18 mcg into inhaler and inhale daily.     No current facility-administered medications for this visit.     OBJECTIVE: Vitals:   10/09/16 1445  BP: 132/68  Pulse: 73  Resp: 18  Temp: 97.8 F (36.6 C)     Body mass index is 31.13 kg/m.    ECOG FS:2 - Symptomatic, <50% confined to bed  General: Well-developed, well-nourished, no acute distress. Eyes: Pink conjunctiva, anicteric sclera. Lungs: Diminished breath sounds bilaterally. Heart: Regular rate and rhythm. No rubs, murmurs, or gallops. Abdomen: Soft, nontender, nondistended. No organomegaly noted, normoactive bowel sounds. Musculoskeletal: No edema, cyanosis, or clubbing. Neuro: Alert, answering all questions appropriately. Cranial nerves grossly intact. Skin: No rashes or petechiae noted. Psych: Normal affect.   LAB RESULTS:  Lab Results  Component Value Date   NA 137 09/20/2016   K 4.1 09/20/2016   CL 101 09/20/2016   CO2 27 09/20/2016   GLUCOSE 113 (H) 09/20/2016   BUN 65 (H) 09/20/2016   CREATININE 2.59 (H) 09/20/2016   CALCIUM 8.3 (L) 09/20/2016   PROT 5.6 (L) 03/18/2016   ALBUMIN 3.9 09/20/2016   AST 29 03/18/2016   ALT 69 (H) 03/18/2016   ALKPHOS 47 03/18/2016   BILITOT 1.1 03/18/2016   GFRNONAA 16 (L) 09/20/2016   GFRAA 19 (L) 09/20/2016    Lab Results  Component Value Date   WBC 6.2 10/09/2016   NEUTROABS 4.9 10/09/2016   HGB 9.9 (L) 10/09/2016   HCT 29.9 (L) 10/09/2016   MCV 80.0 10/09/2016   PLT 180 10/09/2016   Lab Results  Component Value Date   IRON 65 06/19/2016    TIBC 538 (H) 06/19/2016   IRONPCTSAT 12 06/19/2016   Lab Results  Component Value Date   FERRITIN 56 06/19/2016     STUDIES: No results found.  ASSESSMENT: Anemia secondary to chronic renal failure, clinical stage IA adenocarcinoma of right upper lobe  lung.  PLAN:    1. Anemia secondary to chronic renal failure: Patient's hemoglobin has trended below 10.0, therefore will proceed with 40,000 units of Procrit today. Patient is requesting less frequent follow-up therefore she will return to clinic every 2 months for laboratory work and consideration of Procrit. Return to clinic in 6 months with repeat laboratory work and further evaluation.  2. Clinical stage IA adenocarcinoma of right upper lobe lung: Patient is status post stereotactic radiation therapy. Repeat CT scan on March 07, 2016 did not reveal any recurrence or progressive disease. Given patient's multiple comorbidities and declining performance status, no further treatment is recommended. Repeat CT scan in 6 months with follow-up 1-2 days later as above. 3. Chronic renal failure: Since creatinine appears to be at her baseline.  Continue evaluation and treatment per nephrology.   Patient expressed understanding and was in agreement with this plan. She also understands that She can call clinic at any time with any questions, concerns, or complaints.   Cancer Staging Cancer of upper lobe of right lung [facility]) Staging form: Lung, AJCC 7th Edition - Clinical stage from 10/08/2016: Stage IA (T1a, N0, M0) - Signed by Lloyd Huger, MD on 10/08/2016   Lloyd Huger, MD   10/11/2016 1:13 PM

## 2016-10-09 ENCOUNTER — Inpatient Hospital Stay: Payer: Medicare Other | Attending: Oncology | Admitting: Oncology

## 2016-10-09 ENCOUNTER — Inpatient Hospital Stay: Payer: Medicare Other

## 2016-10-09 VITALS — BP 132/68 | HR 73 | Temp 97.8°F | Resp 18 | Wt 187.1 lb

## 2016-10-09 DIAGNOSIS — I2510 Atherosclerotic heart disease of native coronary artery without angina pectoris: Secondary | ICD-10-CM | POA: Diagnosis not present

## 2016-10-09 DIAGNOSIS — Z79899 Other long term (current) drug therapy: Secondary | ICD-10-CM | POA: Diagnosis not present

## 2016-10-09 DIAGNOSIS — Z85118 Personal history of other malignant neoplasm of bronchus and lung: Secondary | ICD-10-CM | POA: Insufficient documentation

## 2016-10-09 DIAGNOSIS — N179 Acute kidney failure, unspecified: Secondary | ICD-10-CM | POA: Diagnosis not present

## 2016-10-09 DIAGNOSIS — K219 Gastro-esophageal reflux disease without esophagitis: Secondary | ICD-10-CM | POA: Diagnosis not present

## 2016-10-09 DIAGNOSIS — D631 Anemia in chronic kidney disease: Principal | ICD-10-CM

## 2016-10-09 DIAGNOSIS — E249 Cushing's syndrome, unspecified: Secondary | ICD-10-CM | POA: Insufficient documentation

## 2016-10-09 DIAGNOSIS — N189 Chronic kidney disease, unspecified: Secondary | ICD-10-CM

## 2016-10-09 DIAGNOSIS — M109 Gout, unspecified: Secondary | ICD-10-CM | POA: Insufficient documentation

## 2016-10-09 DIAGNOSIS — I129 Hypertensive chronic kidney disease with stage 1 through stage 4 chronic kidney disease, or unspecified chronic kidney disease: Secondary | ICD-10-CM | POA: Diagnosis not present

## 2016-10-09 DIAGNOSIS — I509 Heart failure, unspecified: Secondary | ICD-10-CM | POA: Insufficient documentation

## 2016-10-09 DIAGNOSIS — J449 Chronic obstructive pulmonary disease, unspecified: Secondary | ICD-10-CM | POA: Insufficient documentation

## 2016-10-09 DIAGNOSIS — Z923 Personal history of irradiation: Secondary | ICD-10-CM

## 2016-10-09 DIAGNOSIS — C3411 Malignant neoplasm of upper lobe, right bronchus or lung: Secondary | ICD-10-CM

## 2016-10-09 LAB — CBC WITH DIFFERENTIAL/PLATELET
Basophils Absolute: 0.1 10*3/uL (ref 0–0.1)
Basophils Relative: 1 %
Eosinophils Absolute: 0.2 10*3/uL (ref 0–0.7)
Eosinophils Relative: 3 %
HCT: 29.9 % — ABNORMAL LOW (ref 35.0–47.0)
HEMOGLOBIN: 9.9 g/dL — AB (ref 12.0–16.0)
LYMPHS ABS: 0.7 10*3/uL — AB (ref 1.0–3.6)
LYMPHS PCT: 11 %
MCH: 26.6 pg (ref 26.0–34.0)
MCHC: 33.2 g/dL (ref 32.0–36.0)
MCV: 80 fL (ref 80.0–100.0)
Monocytes Absolute: 0.3 10*3/uL (ref 0.2–0.9)
Monocytes Relative: 6 %
NEUTROS ABS: 4.9 10*3/uL (ref 1.4–6.5)
NEUTROS PCT: 79 %
Platelets: 180 10*3/uL (ref 150–440)
RBC: 3.74 MIL/uL — AB (ref 3.80–5.20)
RDW: 16.8 % — ABNORMAL HIGH (ref 11.5–14.5)
WBC: 6.2 10*3/uL (ref 3.6–11.0)

## 2016-10-09 MED ORDER — EPOETIN ALFA 40000 UNIT/ML IJ SOLN
40000.0000 [IU] | Freq: Once | INTRAMUSCULAR | Status: AC
  Administered 2016-10-09: 40000 [IU] via SUBCUTANEOUS
  Filled 2016-10-09: qty 1

## 2016-10-09 NOTE — Progress Notes (Signed)
Patient is here for follow up, oxygen was 95% on 2 liters

## 2016-10-11 LAB — CUP PACEART INCLINIC DEVICE CHECK
Battery Remaining Longevity: 86 mo
Brady Statistic AS VS Percent: 0.02 %
Brady Statistic RV Percent Paced: 99.98 %
Implantable Lead Implant Date: 20170804
Implantable Lead Implant Date: 20170804
Implantable Lead Location: 753858
Implantable Pulse Generator Implant Date: 20170804
Lead Channel Impedance Value: 3344 Ohm
Lead Channel Impedance Value: 551 Ohm
Lead Channel Impedance Value: 874 Ohm
Lead Channel Pacing Threshold Amplitude: 0.75 V
Lead Channel Pacing Threshold Amplitude: 1.75 V
Lead Channel Pacing Threshold Pulse Width: 0.4 ms
Lead Channel Setting Pacing Amplitude: 2.5 V
Lead Channel Setting Pacing Pulse Width: 0.4 ms
Lead Channel Setting Pacing Pulse Width: 1.5 ms
Lead Channel Setting Sensing Sensitivity: 4 mV
MDC IDC LEAD LOCATION: 753860
MDC IDC MSMT BATTERY VOLTAGE: 3.1 V
MDC IDC MSMT LEADCHNL LV IMPEDANCE VALUE: 1007 Ohm
MDC IDC MSMT LEADCHNL LV IMPEDANCE VALUE: 1102 Ohm
MDC IDC MSMT LEADCHNL LV IMPEDANCE VALUE: 608 Ohm
MDC IDC MSMT LEADCHNL LV IMPEDANCE VALUE: 646 Ohm
MDC IDC MSMT LEADCHNL LV IMPEDANCE VALUE: 741 Ohm
MDC IDC MSMT LEADCHNL LV PACING THRESHOLD PULSEWIDTH: 1 ms
MDC IDC MSMT LEADCHNL RA IMPEDANCE VALUE: 3344 Ohm
MDC IDC MSMT LEADCHNL RV IMPEDANCE VALUE: 456 Ohm
MDC IDC SESS DTM: 20171205164349
MDC IDC SET LEADCHNL RV PACING AMPLITUDE: 2.5 V
MDC IDC STAT BRADY AP VP PERCENT: 0 %
MDC IDC STAT BRADY AP VS PERCENT: 0 %
MDC IDC STAT BRADY AS VP PERCENT: 99.98 %
MDC IDC STAT BRADY RA PERCENT PACED: 0 %

## 2016-10-17 ENCOUNTER — Ambulatory Visit: Payer: Medicare Other

## 2016-10-17 ENCOUNTER — Other Ambulatory Visit: Payer: Medicare Other

## 2016-10-20 ENCOUNTER — Encounter: Payer: Self-pay | Admitting: *Deleted

## 2016-10-30 ENCOUNTER — Ambulatory Visit: Payer: Medicare Other

## 2016-10-30 ENCOUNTER — Other Ambulatory Visit: Payer: Medicare Other

## 2016-11-09 ENCOUNTER — Encounter: Payer: Self-pay | Admitting: *Deleted

## 2016-11-09 NOTE — Pre-Procedure Instructions (Signed)
LM AT EYE CENTER RE ALLERGIES TO CIPRO AND ALBUTEROL NOT LISTED BY EYE CENTER H/P. ASKED FOR CALLBACK TO VERIFY OR [facility]ANE EYE DROP ORDERS.

## 2016-11-13 ENCOUNTER — Ambulatory Visit: Payer: Medicare Other

## 2016-11-13 ENCOUNTER — Other Ambulatory Visit: Payer: Medicare Other

## 2016-11-13 NOTE — Pre-Procedure Instructions (Signed)
CONTINUE WITH EYE DROPS AS ORDERED. NO CARDIAC CLEARANCE NEEDED. SAW DR Doy Hutching 10/30/16 WHO NOTED TO HAVE EYE SURGERY. NOTE ON CHART

## 2016-11-14 ENCOUNTER — Encounter
Admission: RE | Disposition: A | Discharge status: Home / Self Care | Payer: Self-pay | Source: Ambulatory Visit | Attending: Ophthalmology

## 2016-11-14 ENCOUNTER — Ambulatory Visit
Admission: RE | Admit: 2016-11-14 | Discharge: 2016-11-14 | Disposition: A | Discharge status: Home / Self Care | Payer: Medicare Other | Source: Ambulatory Visit | Attending: Ophthalmology | Admitting: Ophthalmology

## 2016-11-14 ENCOUNTER — Ambulatory Visit: Payer: Medicare Other | Admitting: Anesthesiology

## 2016-11-14 ENCOUNTER — Encounter: Payer: Self-pay | Admitting: *Deleted

## 2016-11-14 DIAGNOSIS — F329 Major depressive disorder, single episode, unspecified: Secondary | ICD-10-CM | POA: Diagnosis not present

## 2016-11-14 DIAGNOSIS — R51 Headache: Secondary | ICD-10-CM | POA: Insufficient documentation

## 2016-11-14 DIAGNOSIS — E249 Cushing's syndrome, unspecified: Secondary | ICD-10-CM | POA: Diagnosis not present

## 2016-11-14 DIAGNOSIS — M109 Gout, unspecified: Secondary | ICD-10-CM | POA: Diagnosis not present

## 2016-11-14 DIAGNOSIS — I252 Old myocardial infarction: Secondary | ICD-10-CM | POA: Insufficient documentation

## 2016-11-14 DIAGNOSIS — I4891 Unspecified atrial fibrillation: Secondary | ICD-10-CM | POA: Insufficient documentation

## 2016-11-14 DIAGNOSIS — I509 Heart failure, unspecified: Secondary | ICD-10-CM | POA: Insufficient documentation

## 2016-11-14 DIAGNOSIS — J449 Chronic obstructive pulmonary disease, unspecified: Secondary | ICD-10-CM | POA: Insufficient documentation

## 2016-11-14 DIAGNOSIS — M1990 Unspecified osteoarthritis, unspecified site: Secondary | ICD-10-CM | POA: Diagnosis not present

## 2016-11-14 DIAGNOSIS — Z950 Presence of cardiac pacemaker: Secondary | ICD-10-CM | POA: Diagnosis not present

## 2016-11-14 DIAGNOSIS — D649 Anemia, unspecified: Secondary | ICD-10-CM | POA: Insufficient documentation

## 2016-11-14 DIAGNOSIS — I2510 Atherosclerotic heart disease of native coronary artery without angina pectoris: Secondary | ICD-10-CM | POA: Diagnosis not present

## 2016-11-14 DIAGNOSIS — Z85118 Personal history of other malignant neoplasm of bronchus and lung: Secondary | ICD-10-CM | POA: Diagnosis not present

## 2016-11-14 DIAGNOSIS — Z955 Presence of coronary angioplasty implant and graft: Secondary | ICD-10-CM | POA: Insufficient documentation

## 2016-11-14 DIAGNOSIS — Z87891 Personal history of nicotine dependence: Secondary | ICD-10-CM | POA: Insufficient documentation

## 2016-11-14 DIAGNOSIS — E7800 Pure hypercholesterolemia, unspecified: Secondary | ICD-10-CM | POA: Insufficient documentation

## 2016-11-14 DIAGNOSIS — N183 Chronic kidney disease, stage 3 (moderate): Secondary | ICD-10-CM | POA: Insufficient documentation

## 2016-11-14 DIAGNOSIS — I110 Hypertensive heart disease with heart failure: Secondary | ICD-10-CM | POA: Insufficient documentation

## 2016-11-14 DIAGNOSIS — G4730 Sleep apnea, unspecified: Secondary | ICD-10-CM | POA: Diagnosis not present

## 2016-11-14 DIAGNOSIS — Z9981 Dependence on supplemental oxygen: Secondary | ICD-10-CM | POA: Diagnosis not present

## 2016-11-14 DIAGNOSIS — Z923 Personal history of irradiation: Secondary | ICD-10-CM | POA: Insufficient documentation

## 2016-11-14 DIAGNOSIS — K219 Gastro-esophageal reflux disease without esophagitis: Secondary | ICD-10-CM | POA: Diagnosis not present

## 2016-11-14 DIAGNOSIS — E1136 Type 2 diabetes mellitus with diabetic cataract: Secondary | ICD-10-CM | POA: Diagnosis not present

## 2016-11-14 HISTORY — PX: CATARACT EXTRACTION W/PHACO: SHX586

## 2016-11-14 HISTORY — DX: Hypoxemia: R09.02

## 2016-11-14 HISTORY — DX: Presence of cardiac pacemaker: Z95.0

## 2016-11-14 LAB — GLUCOSE, CAPILLARY: Glucose-Capillary: 118 mg/dL — ABNORMAL HIGH (ref 65–99)

## 2016-11-14 SURGERY — PHACOEMULSIFICATION, CATARACT, WITH IOL INSERTION
Anesthesia: Monitor Anesthesia Care | Site: Eye | Laterality: Left | Wound class: Clean

## 2016-11-14 MED ORDER — MOXIFLOXACIN HCL 0.5 % OP SOLN: 1.0000 [drp] | OPHTHALMIC | Status: DC | PRN

## 2016-11-14 MED ORDER — LIDOCAINE HCL (PF) 2 % IJ SOLN
INTRAMUSCULAR | Status: AC
  Filled 2016-11-14: qty 2

## 2016-11-14 MED ORDER — POVIDONE-IODINE 5 % OP SOLN
OPHTHALMIC | Status: DC | PRN
  Administered 2016-11-14: 1 via OPHTHALMIC

## 2016-11-14 MED ORDER — ACETYLCHOLINE CHLORIDE 20 MG IO SOLR
INTRAOCULAR | Status: DC | PRN
  Administered 2016-11-14: 5 mg via INTRAOCULAR

## 2016-11-14 MED ORDER — MOXIFLOXACIN HCL 0.5 % OP SOLN
OPHTHALMIC | Status: DC | PRN
  Administered 2016-11-14: 0.2 mL via OPHTHALMIC

## 2016-11-14 MED ORDER — ARMC OPHTHALMIC DILATING DROPS
1.0000 "application " | OPHTHALMIC | Status: AC
  Administered 2016-11-14 (×3): 1 via OPHTHALMIC

## 2016-11-14 MED ORDER — LIDOCAINE HCL (PF) 4 % IJ SOLN
INTRAOCULAR | Status: DC | PRN
  Administered 2016-11-14: 4 mL via OPHTHALMIC

## 2016-11-14 MED ORDER — MOXIFLOXACIN HCL 0.5 % OP SOLN
OPHTHALMIC | Status: AC
  Filled 2016-11-14: qty 3

## 2016-11-14 MED ORDER — FENTANYL CITRATE (PF) 100 MCG/2ML IJ SOLN
INTRAMUSCULAR | Status: DC | PRN
  Administered 2016-11-14 (×2): 25 ug via INTRAVENOUS

## 2016-11-14 MED ORDER — ACETYLCHOLINE CHLORIDE 20 MG IO SOLR
INTRAOCULAR | Status: AC
  Filled 2016-11-14: qty 1

## 2016-11-14 MED ORDER — FENTANYL CITRATE (PF) 100 MCG/2ML IJ SOLN
INTRAMUSCULAR | Status: AC
  Filled 2016-11-14: qty 2

## 2016-11-14 MED ORDER — ARMC OPHTHALMIC DILATING DROPS
OPHTHALMIC | Status: AC
  Filled 2016-11-14: qty 0.4

## 2016-11-14 MED ORDER — EPINEPHRINE PF 1 MG/ML IJ SOLN
INTRAMUSCULAR | Status: AC
  Filled 2016-11-14: qty 2

## 2016-11-14 MED ORDER — NA CHONDROIT SULF-NA HYALURON 40-17 MG/ML IO SOLN
INTRAOCULAR | Status: DC | PRN
  Administered 2016-11-14: 1 mL via INTRAOCULAR

## 2016-11-14 MED ORDER — SODIUM CHLORIDE 0.9 % IV SOLN
INTRAVENOUS | Status: DC
  Administered 2016-11-14: 08:00:00 via INTRAVENOUS

## 2016-11-14 MED ORDER — NA CHONDROIT SULF-NA HYALURON 40-17 MG/ML IO SOLN
INTRAOCULAR | Status: AC
  Filled 2016-11-14: qty 1

## 2016-11-14 MED ORDER — EPINEPHRINE PF 1 MG/ML IJ SOLN
INTRAMUSCULAR | Status: DC | PRN
  Administered 2016-11-14: 09:00:00 via OPHTHALMIC

## 2016-11-14 MED ORDER — POVIDONE-IODINE 5 % OP SOLN
OPHTHALMIC | Status: AC
  Filled 2016-11-14: qty 30

## 2016-11-14 SURGICAL SUPPLY — 21 items
CANNULA ANT/CHMB 27GA (MISCELLANEOUS) ×3 IMPLANT
CUP MEDICINE 2OZ PLAST GRAD ST (MISCELLANEOUS) ×3 IMPLANT
GLOVE BIO SURGEON STRL SZ8 (GLOVE) ×3 IMPLANT
GLOVE BIOGEL M 6.5 STRL (GLOVE) ×3 IMPLANT
GLOVE SURG LX 8.0 MICRO (GLOVE) ×2
GLOVE SURG LX STRL 8.0 MICRO (GLOVE) ×1 IMPLANT
GOWN STRL REUS W/ TWL LRG LVL3 (GOWN DISPOSABLE) ×2 IMPLANT
GOWN STRL REUS W/TWL LRG LVL3 (GOWN DISPOSABLE) ×4
LENS IOL TECNIS ITEC 22.5 (Intraocular Lens) ×3 IMPLANT
PACK CATARACT (MISCELLANEOUS) ×3 IMPLANT
PACK CATARACT BRASINGTON LX (MISCELLANEOUS) ×3 IMPLANT
PACK EYE AFTER SURG (MISCELLANEOUS) ×3 IMPLANT
SOL BSS BAG (MISCELLANEOUS) ×3
SOL PREP PVP 2OZ (MISCELLANEOUS) ×3
SOLUTION BSS BAG (MISCELLANEOUS) ×1 IMPLANT
SOLUTION PREP PVP 2OZ (MISCELLANEOUS) ×1 IMPLANT
SYR 3ML LL SCALE MARK (SYRINGE) ×3 IMPLANT
SYR 5ML LL (SYRINGE) ×3 IMPLANT
SYR TB 1ML 27GX1/2 LL (SYRINGE) ×3 IMPLANT
WATER STERILE IRR 250ML POUR (IV SOLUTION) ×3 IMPLANT
WIPE NON LINTING 3.25X3.25 (MISCELLANEOUS) ×3 IMPLANT

## 2016-11-14 NOTE — Discharge Instructions (Signed)

## 2016-11-14 NOTE — Anesthesia Post-op Follow-up Note (Cosign Needed)
Anesthesia QCDR form completed.        

## 2016-11-14 NOTE — Anesthesia Postprocedure Evaluation (Signed)
Anesthesia Post Note  Patient: Regina Burke  Procedure(s) Performed: Procedure(s) (LRB): CATARACT EXTRACTION PHACO AND INTRAOCULAR LENS PLACEMENT (IOC) (Left)  Patient location during evaluation: PACU Anesthesia Type: MAC Level of consciousness: awake, awake and alert and oriented Pain management: pain level controlled Vital Signs Assessment: post-procedure vital signs reviewed and stable Respiratory status: spontaneous breathing Cardiovascular status: blood pressure returned to baseline Postop Assessment: no headache, no backache and no signs of nausea or vomiting Anesthetic complications: no     Last Vitals:  Vitals:   11/14/16 0741 11/14/16 0913  BP: (!) 131/42 (!) 131/52  Pulse: 76 70  Resp: 20 12  Temp: 36.6 C (!) 36 C    Last Pain:  Vitals:   11/14/16 0913  TempSrc: Tympanic                 Jashley Yellin Lorenza Chick

## 2016-11-14 NOTE — Op Note (Signed)
PREOPERATIVE DIAGNOSIS:  Nuclear sclerotic cataract of the left eye.   POSTOPERATIVE DIAGNOSIS:  Nuclear sclerotic cataract of the left eye.   OPERATIVE PROCEDURE: Procedure(s): CATARACT EXTRACTION PHACO AND INTRAOCULAR LENS PLACEMENT (IOC)   SURGEON:  Birder Robson, MD.   ANESTHESIA:  Anesthesiologist: Andria Frames, MD CRNA: Hedda Slade, CRNA; Aline Brochure, CRNA  1.      Managed anesthesia care. 2.     0.39m of Shugarcaine was instilled following the paracentesis   COMPLICATIONS:  None.   TECHNIQUE:   Stop and chop   DESCRIPTION OF PROCEDURE:  The patient was examined and consented in the preoperative holding area where the aforementioned topical anesthesia was applied to the left eye and then brought back to the Operating Room where the left eye was prepped and draped in the usual sterile ophthalmic fashion and a lid speculum was placed. A paracentesis was created with the side port blade and the anterior chamber was filled with viscoelastic. A near clear corneal incision was performed with the steel keratome. A continuous curvilinear capsulorrhexis was performed with a cystotome followed by the capsulorrhexis forceps. Hydrodissection and hydrodelineation were carried out with BSS on a blunt cannula. The lens was removed in a stop and chop  technique and the remaining cortical material was removed with the irrigation-aspiration handpiece. The capsular bag was inflated with viscoelastic and the Technis [id number] lens was placed in the capsular bag without complication. The remaining viscoelastic was removed from the eye with the irrigation-aspiration handpiece. The wounds were hydrated. The anterior chamber was flushed with Miostat and the eye was inflated to physiologic pressure. 0.148mVigamox was placed in the anterior chamber. The wounds were found to be water tight. The eye was dressed with Vigamox. The patient was given protective glasses to wear throughout the day and a shield  with which to sleep tonight. The patient was also given drops with which to begin a drop regimen today and will follow-up with me in one day.  Implant Name Type Inv. Item Serial No. Manufacturer Lot No. LRB No. Used  LENS IOL DIOP 22.5 - [id number] Intraocular Lens LENS IOL DIOP 22.5 [id number] AMO   Left 1    Procedure(s) with comments: CATARACT EXTRACTION PHACO AND INTRAOCULAR LENS PLACEMENT (IOC) (Left) - [place]9.8 AP% 18.1 CDE 10.78 Fluid pack lot # [id number]  Electronically signed: POCherokee/20/2018 9:11 AM

## 2016-11-14 NOTE — H&P (Signed)
All labs reviewed. Abnormal studies sent to patients PCP when indicated.  Previous H&P reviewed, patient examined, there are NO CHANGES.  Regina Burke LOUIS3/20/20188:46 AM

## 2016-11-14 NOTE — Anesthesia Preprocedure Evaluation (Signed)
Anesthesia Evaluation  Patient identified by MRN, date of birth, ID band Patient awake    Reviewed: Allergy & Precautions, H&P , NPO status , Patient's Chart, lab work & pertinent test results  History of Anesthesia Complications Negative for: history of anesthetic complications  Airway Mallampati: III  TM Distance: >3 FB Neck ROM: limited    Dental  (+) Poor Dentition   Pulmonary shortness of breath and with exertion, asthma , sleep apnea , pneumonia, resolved, COPD,  COPD inhaler and oxygen dependent, former smoker,    Pulmonary exam normal breath sounds clear to auscultation       Cardiovascular Exercise Tolerance: Poor hypertension, (-) angina+ CAD, + Past MI, + Cardiac Stents, +CHF and + DOE  Normal cardiovascular exam+ dysrhythmias Atrial Fibrillation + pacemaker + Valvular Problems/Murmurs  Rhythm:regular Rate:Normal     Neuro/Psych  Headaches, PSYCHIATRIC DISORDERS Depression    GI/Hepatic Neg liver ROS, hiatal hernia, GERD  Controlled,  Endo/Other  diabetes, Type 2  Renal/GU CRFRenal disease  negative genitourinary   Musculoskeletal  (+) Arthritis ,   Abdominal   Peds  Hematology negative hematology ROS (+)   Anesthesia Other Findings Past Medical History:   COPD (chronic obstructive pulmonary disease) (*              CHF (congestive heart failure) (HCC)                         Hypertension                                                 Rapid palpitations                              2014           Comment:Seen at [facility], may have been atrial fibrillation   DOE (dyspnea on exertion)                                      Comment:Started after treatment for lung cancer   High cholesterol                                             Heart murmur                                                 Myocardial infarction ([place])                     12/22/2014    Sleep apnea                                                   Cushing's disease ([place])  Type II diabetes mellitus (HCC)                              Anemia                                                       GERD (gastroesophageal reflux disease)                       History of hiatal hernia                                     Migraine                                                     Arthritis                                                    Gout                                                         Depression                                                   Asthma                                                       Dysrhythmia                                                  Coronary artery disease                                      HOH (hard of hearing)                                        Chronic kidney disease (CKD), stage III (moder*                Comment:INCREASED KT   Anginal pain ([place])  Cough                                                          Comment:CHRONIC AT NIGHT   Edema                                                          Comment:FEET/LEGS   Wheezing                                                     Pneumonia                                                    Lung cancer ([place])                               dx'd 2014      Comment:S/P radiation 2015   Diverticulitis                                               Multiple allergies                                           Atrial fibrillation ([place])                                   Past Surgical History:   ADRENALECTOMY                                   Left 1980's         Comment:"Cushings"   ABDOMINAL HYSTERECTOMY                                        APPENDECTOMY                                                  CHOLECYSTECTOMY                                               CORONARY ANGIOPLASTY WITH STENT PLACEMENT  12/23/2014    TONSILLECTOMY                                                  FRACTURE SURGERY                                              WRIST FRACTURE SURGERY                          Bilateral ~ 2000       BREAST CYST EXCISION                            Left              TUBAL LIGATION                                                LEFT HEART CATHETERIZATION WITH CORONARY ANGIO* N/A 12/23/2014      Comment:Procedure: LEFT HEART CATHETERIZATION WITH               CORONARY ANGIOGRAM;  Surgeon: Burnell Blanks, MD;  Location: [facility] LAB;  Service:              Cardiovascular;  Laterality: N/A;   PERCUTANEOUS CORONARY STENT INTERVENTION (PCI-*  12/23/2014      Comment:Procedure: PERCUTANEOUS CORONARY STENT               INTERVENTION (PCI-S);  Surgeon: Burnell Blanks, MD;  Location: [facility] CATH LAB;  Service:              Cardiovascular;;  Promus 2.25x8  BMI    Body Mass Index   29.16 kg/m 2    Patient has cardiac clearance for this procedure.    Reproductive/Obstetrics negative OB ROS                             Anesthesia Physical  Anesthesia Plan  ASA: IV  Anesthesia Plan: MAC   Post-op Pain Management:    Induction:   Airway Management Planned:   Additional Equipment:   Intra-op Plan:   Post-operative Plan:   Informed Consent: I have reviewed the patients History and Physical, chart, labs and discussed the procedure including the risks, benefits and alternatives for the proposed anesthesia with the patient or authorized representative who has indicated his/her understanding and acceptance.   Dental Advisory Given  Plan Discussed with: Anesthesiologist, CRNA and Surgeon  Anesthesia Plan Comments:         Anesthesia Quick Evaluation

## 2016-11-14 NOTE — Transfer of Care (Signed)
Immediate Anesthesia Transfer of Care Note  Patient: Regina Burke  Procedure(s) Performed: Procedure(s) with comments: CATARACT EXTRACTION PHACO AND INTRAOCULAR LENS PLACEMENT (IOC) (Left) - [place] 59.8 AP% 18.1 CDE 10.78 Fluid pack lot # [id number]  Patient Location: PACU  Anesthesia Type:MAC  Level of Consciousness: awake, alert  and oriented  Airway & Oxygen Therapy: Patient Spontanous Breathing  Post-op Assessment: Report given to RN and Post -op Vital signs reviewed and stable  Post vital signs: Reviewed and stable  Last Vitals:  Vitals:   11/14/16 0741 11/14/16 0913  BP: (!) 131/42 (!) 131/52  Pulse: 76 70  Resp: 20 12  Temp: 36.6 C (!) 36 C    Last Pain:  Vitals:   11/14/16 0913  TempSrc: Tympanic         Complications: No apparent anesthesia complications

## 2016-11-27 ENCOUNTER — Other Ambulatory Visit: Payer: Medicare Other

## 2016-11-27 ENCOUNTER — Ambulatory Visit: Payer: Medicare Other

## 2016-12-02 NOTE — Progress Notes (Signed)
Cardiology Office Note  Date:  12/05/2016   ID:  Regina Burke, DOB 04/28/1933, MRN [id number]  PCP:  Tracie Harrier, MD   Chief Complaint  Patient presents with  . other    6 mo follow up.  Pt c/o fluttering feeling, SOB on over excertion.  Meds verbally reviewed with patient.      HPI:  Regina Burke is a 83 y.o. female With  COPD on chronic oxygen,    coronary artery disease with hospitalization April 2016 stent placed to marginal branch of the circumflex, Persistent atrial fibrillation,  03/31/2016, AV node ablation,  Biventricular ICD implantation severe anemia, previous epo shot lung cancer, diabetes mellitus,  episode of acute respiratory distress requiring hospitalization  who presents for routine follow-up of her atrial fibrillation, chronic diastolic CHF  In follow-up today she reports that she is doing well Metoprolol was held by Dr. Caryl Comes Dec 2017  she does not check her blood pressure at home No regular exercise program ChronicLower back pain, central back pain, worse after she has been standing for long periods of time  Uses a cane.   Denies any chest pain Feels her breathing is stable  EKG on today's visit shows atrial fibrillation/flutter, paced rhythm at 86 bpm Previous EKG appeared more like flutter, today more like atrial fibrillation  Other past medical history reviewed  admission to [facility] with acute respiratory distress, pneumonia, bilateral pleural effusions requiring thoracentesis, acute on chronic diastolic heart failure,   hospital admission in August 2016  for electrolyte abnormality, digoxin toxicity, readmitted 06/07/2015 with shortness of breath, found to have atrial fibrillation , potassium of 7, renal failure, anemia who presents for routine follow-up after recent hospitalization for atrial fibrillation with RVR  Previously on amiodarone infusion for atrial fibrillation , converted shortly after to normal sinus rhythm She did have IV  infiltration of the left forearm, had to go back to the emergency room and was treated with a special regiment as detailed in the notes.  Hospital records reviewed from admission October 2016 at [facility] recent hospitalization for respiratory distress, etiology Likely multifactorial including profound anemia, atrial fibrillation, severe COPD, pleural effusions . she has not qualified for BiPAP, "did the wrong sleep study (CPAP)."  In the hospital had significant anemia, blood count down to hemoglobin 6.7  Was in atrial fibrillation but Converted to normal sinus rhythm after thoracentesis (first one) Breathing better after second thoracentesis   She did have Digoxin toxicity, This was not restarted at discharge   Presented to [facility] on  12/22/2014 with chest pain.   cardiac catheterization  found to have mild 50% disease in the diagonal branch of the LAD, but high-grade 90% hazy stenosis in the marginal branch of the circumflex.  She underwent successful PTCA and insertion of a 2.25x 8 millimeter Promus premier DES stent at the ostium of the OM3 vessell which was postdilated to 2.5 mm.   PMH:   has a past medical history of Anemia; Arthritis; Asthma; Cardiac resynchronization therapy pacemaker (CRT-P) in place; Chronic diastolic CHF (congestive heart failure) ([place]); Chronic kidney disease (CKD), stage III (moderate); COPD (chronic obstructive pulmonary disease) ([place]); Coronary artery disease; Cough; Cushing's disease ([place]); Depression; Diverticulitis; Dyspnea; Dysrhythmia; Edema; GERD (gastroesophageal reflux disease); Gout; Gout; Heart murmur; History of hiatal hernia; History of pneumonia; HLD (hyperlipidemia); HOH (hard of hearing); Hypertension; Hypertensive heart disease; Lung cancer ([place]) (dx'd 2014); Migraine; Multiple allergies; Myocardial infarction; Oxygen deficiency; Persistent atrial fibrillation ([place]); Presence of permanent cardiac pacemaker; S/P AV nodal  ablation; Sleep apnea; and Type II  diabetes mellitus ([place]).  PSH:    Past Surgical History:  Procedure Laterality Date  . ABDOMINAL HYSTERECTOMY    . ABLATION     July 2017  . ADRENALECTOMY Left 1980's   "Cushings"  . APPENDECTOMY    . BREAST CYST EXCISION Left   . CATARACT EXTRACTION W/PHACO Right 12/28/2015   Procedure: CATARACT EXTRACTION PHACO AND INTRAOCULAR LENS PLACEMENT (IOC);  Surgeon: Birder Robson, MD;  Location: [facility] ORS;  Service: Ophthalmology;  Laterality: Right;  [place] 48.4   . CATARACT EXTRACTION W/PHACO Left 11/14/2016   Procedure: CATARACT EXTRACTION PHACO AND INTRAOCULAR LENS PLACEMENT (IOC);  Surgeon: Birder Robson, MD;  Location: [facility] ORS;  Service: Ophthalmology;  Laterality: Left;  [place] 59.8 AP% 18.1 CDE 10.78 Fluid pack lot # [id number]  . CHOLECYSTECTOMY    . CORONARY ANGIOPLASTY WITH STENT PLACEMENT  12/23/2014  . ELECTROPHYSIOLOGIC STUDY N/A 03/08/2016   Procedure: CARDIOVERSION;  Surgeon: Wende Bushy, MD;  Location: [facility] ORS;  Service: Cardiovascular;  Laterality: N/A;  . ELECTROPHYSIOLOGIC STUDY N/A 03/07/2016   Procedure: Cardioversion;  Surgeon: Wende Bushy, MD;  Location: [facility] ORS;  Service: Cardiovascular;  Laterality: N/A;  . ELECTROPHYSIOLOGIC STUDY N/A 03/31/2016   Procedure: AV Node Ablation;  Surgeon: Will Meredith Leeds, MD;  Location: [place] CV LAB;  Service: Cardiovascular;  Laterality: N/A;  . EP IMPLANTABLE DEVICE N/A 03/31/2016   Procedure: BiV Pacemaker Insertion CRT-P;  Surgeon: Will Meredith Leeds, MD;  Location: [place] CV LAB;  Service: Cardiovascular;  Laterality: N/A;  . FRACTURE SURGERY    . INSERT / REPLACE / REMOVE PACEMAKER  02/2016  . LEFT HEART CATHETERIZATION WITH CORONARY ANGIOGRAM N/A 12/23/2014   Procedure: LEFT HEART CATHETERIZATION WITH CORONARY ANGIOGRAM;  Surgeon: Burnell Blanks, MD;  Location: [facility] CATH LAB;  Service: Cardiovascular;  Laterality: N/A;  . PERCUTANEOUS CORONARY STENT INTERVENTION (PCI-S)  12/23/2014   Procedure: PERCUTANEOUS  CORONARY STENT INTERVENTION (PCI-S);  Surgeon: Burnell Blanks, MD;  Location: [facility] CATH LAB;  Service: Cardiovascular;;  Promus 2.25x8  . TONSILLECTOMY    . TRANSTHORACIC ECHOCARDIOGRAM  11/26/2015   Technically difficult study. EF 55-60%. Normal wall motion. GR 1 DD.  . TUBAL LIGATION    . WRIST FRACTURE SURGERY Bilateral ~ 2000    Current Outpatient Prescriptions  Medication Sig Dispense Refill  . allopurinol (ZYLOPRIM) 100 MG tablet Take 100 mg by mouth daily.    Marland Kitchen ALPRAZolam (XANAX) 0.25 MG tablet Take 1 tablet (0.25 mg total) by mouth at bedtime as needed for anxiety. (Patient taking differently: Take 0.5 mg by mouth at bedtime as needed for anxiety. ) 7 tablet 0  . apixaban (ELIQUIS) 2.5 MG TABS tablet Take 1 tablet (2.5 mg total) by mouth every 12 (twelve) hours. 180 tablet 3  . atorvastatin (LIPITOR) 20 MG tablet Take 1 tablet (20 mg total) by mouth every evening. 90 tablet 3  . cholecalciferol (VITAMIN D) 400 UNITS TABS tablet Take 400 Units by mouth every evening.     . cyanocobalamin 500 MCG tablet Take 500 mcg by mouth every evening.    . fenofibrate (TRICOR) 48 MG tablet Take 48 mg by mouth every evening.     . ferrous sulfate 325 (65 FE) MG EC tablet Take 325 mg by mouth every evening.     . fluticasone furoate-vilanterol (BREO ELLIPTA) 100-25 MCG/INH AEPB Inhale 1 puff into the lungs daily.    . furosemide (LASIX) 40 MG tablet Take 1 tablet (40 mg total)  by mouth daily. (Patient taking differently: Take 20 mg by mouth daily. ) 30 tablet 6  . Multiple Vitamins-Minerals (PRESERVISION AREDS 2) CAPS Take 1 capsule by mouth daily.    . nitroGLYCERIN (NITROSTAT) 0.4 MG SL tablet Place 1 tablet (0.4 mg total) under the tongue every 5 (five) minutes as needed for chest pain. 25 tablet 3  . pantoprazole (PROTONIX) 40 MG tablet Take 40 mg by mouth every evening.     . sertraline (ZOLOFT) 50 MG tablet Take 1 tablet (50 mg total) by mouth daily. 30 tablet 2  . tiotropium (SPIRIVA) 18  MCG inhalation capsule Place 18 mcg into inhaler and inhale daily.     No current facility-administered medications for this visit.      Allergies:   Ciprofloxacin; Doxycycline; Penicillins; Sulfa antibiotics; Latex; Morphine and related; Albuterol sulfate; and Cefuroxime   Social History:  The patient  reports that she quit smoking about 22 years ago. Her smoking use included Cigarettes. She has a 45.00 pack-year smoking history. She has never used smokeless tobacco. She reports that she does not drink alcohol or use drugs.   Family History:   family history includes COPD in her brother; Diabetes in her mother; Heart disease in her father and mother; Hypertension in her father and mother; Osteoarthritis in her mother.    Review of Systems: Review of Systems  Constitutional: Negative.   Respiratory: Positive for shortness of breath.   Cardiovascular: Negative.   Gastrointestinal: Negative.   Musculoskeletal: Negative.   Neurological: Negative.   Psychiatric/Behavioral: Negative.   All other systems reviewed and are negative.    PHYSICAL EXAM: VS:  BP (!) 150/66 (BP Location: Right Arm, Patient Position: Sitting, Cuff Size: Normal)   Pulse 86   Ht '5\' 4"'$  (1.626 m)   Wt 188 lb 8 oz (85.5 kg)   BMI 32.36 kg/m  , BMI Body mass index is 32.36 kg/m. GEN: Well nourished, well developed, in no acute distress ,  cane HEENT: normal  Neck: no JVD, carotid bruits, or masses Cardiac: RRR; no murmurs, rubs, or gallops,no edema  Respiratory:  clear to auscultation bilaterally, normal work of breathing GI: soft, nontender, nondistended, + BS MS: no deformity or atrophy  Skin: warm and dry, no rash Neuro:  Strength and sensation are intact Psych: euthymic mood, full affect    Recent Labs: 03/17/2016: B Natriuretic Peptide 548.0; Magnesium 2.1 03/18/2016: ALT 69 09/20/2016: BUN 65; Creatinine, Ser 2.59; Potassium 4.1; Sodium 137 12/04/2016: Hemoglobin 10.2; Platelets 162    Lipid  Panel Lab Results  Component Value Date   CHOL 202 (H) 12/23/2014   HDL 28 (L) 12/23/2014   LDLCALC 107 (H) 12/23/2014   TRIG 337 (H) 12/23/2014      Wt Readings from Last 3 Encounters:  12/05/16 188 lb 8 oz (85.5 kg)  11/14/16 183 lb (83 kg)  10/09/16 187 lb 1 oz (84.8 kg)       ASSESSMENT AND PLAN:   Chronic diastolic CHF (congestive heart failure) ([place]) - Plan: EKG 12-Lead Appears structurally euvolemic on today's visit,  continue Lasix 20 mg daily Currently not on ACE inhibitor, ARB given renal dysfunction  Persistent atrial fibrillation (HCC) - Plan: EKG 12-Lead Recent AV node ablation, ICD Tolerating anticoagulation  Coronary artery disease involving native coronary artery of native heart without angina pectoris Currently with no symptoms of angina. No further workup at this time. Continue current medication regimen.  Essential hypertension Blood pressure markedly elevated on today's visit that she  was arguing with her son Recommended she monitor blood pressure at home and call our office if this continues to run high 110Y systolic on my check  S/P AV nodal ablation Doing well following her procedure  Iron deficiency anemia due to chronic blood loss Previously received Epogen  Chronic obstructive pulmonary disease, unspecified COPD type ([place]) She denies any recent COPD exacerbation On chronic oxygen    Total encounter time more than 25 minutes  Greater than 50% was spent in counseling and coordination of care with the patient   Disposition:   F/U  6 months   Orders Placed This Encounter  Procedures  . EKG 12-Lead     Signed, Esmond Plants, M.D., Ph.D. 12/05/2016  [place], [place]

## 2016-12-04 ENCOUNTER — Inpatient Hospital Stay: Payer: Medicare Other

## 2016-12-04 ENCOUNTER — Inpatient Hospital Stay: Payer: Medicare Other | Attending: Oncology

## 2016-12-04 DIAGNOSIS — Z923 Personal history of irradiation: Secondary | ICD-10-CM | POA: Diagnosis not present

## 2016-12-04 DIAGNOSIS — N179 Acute kidney failure, unspecified: Secondary | ICD-10-CM | POA: Diagnosis not present

## 2016-12-04 DIAGNOSIS — I129 Hypertensive chronic kidney disease with stage 1 through stage 4 chronic kidney disease, or unspecified chronic kidney disease: Secondary | ICD-10-CM | POA: Insufficient documentation

## 2016-12-04 DIAGNOSIS — D631 Anemia in chronic kidney disease: Secondary | ICD-10-CM | POA: Insufficient documentation

## 2016-12-04 DIAGNOSIS — Z85118 Personal history of other malignant neoplasm of bronchus and lung: Secondary | ICD-10-CM | POA: Diagnosis not present

## 2016-12-04 DIAGNOSIS — Z79899 Other long term (current) drug therapy: Secondary | ICD-10-CM | POA: Diagnosis not present

## 2016-12-04 DIAGNOSIS — N189 Chronic kidney disease, unspecified: Secondary | ICD-10-CM

## 2016-12-04 LAB — CBC WITH DIFFERENTIAL/PLATELET
Basophils Absolute: 0.1 10*3/uL (ref 0–0.1)
Basophils Relative: 1 %
EOS ABS: 0.3 10*3/uL (ref 0–0.7)
EOS PCT: 4 %
HCT: 30.3 % — ABNORMAL LOW (ref 35.0–47.0)
Hemoglobin: 10.2 g/dL — ABNORMAL LOW (ref 12.0–16.0)
LYMPHS PCT: 10 %
Lymphs Abs: 0.7 10*3/uL — ABNORMAL LOW (ref 1.0–3.6)
MCH: 27.4 pg (ref 26.0–34.0)
MCHC: 33.8 g/dL (ref 32.0–36.0)
MCV: 80.9 fL (ref 80.0–100.0)
MONO ABS: 0.4 10*3/uL (ref 0.2–0.9)
Monocytes Relative: 6 %
Neutro Abs: 5.3 10*3/uL (ref 1.4–6.5)
Neutrophils Relative %: 79 %
PLATELETS: 162 10*3/uL (ref 150–440)
RBC: 3.75 MIL/uL — ABNORMAL LOW (ref 3.80–5.20)
RDW: 16.9 % — AB (ref 11.5–14.5)
WBC: 6.7 10*3/uL (ref 3.6–11.0)

## 2016-12-04 LAB — IRON AND TIBC
Iron: 56 ug/dL (ref 28–170)
Saturation Ratios: 13 % (ref 10.4–31.8)
TIBC: 444 ug/dL (ref 250–450)
UIBC: 388 ug/dL

## 2016-12-04 LAB — FERRITIN: FERRITIN: 71 ng/mL (ref 11–307)

## 2016-12-05 ENCOUNTER — Encounter: Payer: Self-pay | Admitting: Cardiovascular Disease

## 2016-12-05 ENCOUNTER — Ambulatory Visit (INDEPENDENT_AMBULATORY_CARE_PROVIDER_SITE_OTHER): Payer: Medicare Other | Admitting: Cardiovascular Disease

## 2016-12-05 VITALS — BP 150/66 | HR 86 | Ht 64.0 in | Wt 188.5 lb

## 2016-12-05 DIAGNOSIS — I5032 Chronic diastolic (congestive) heart failure: Secondary | ICD-10-CM

## 2016-12-05 DIAGNOSIS — I10 Essential (primary) hypertension: Secondary | ICD-10-CM | POA: Diagnosis not present

## 2016-12-05 DIAGNOSIS — Z950 Presence of cardiac pacemaker: Secondary | ICD-10-CM

## 2016-12-05 DIAGNOSIS — E1159 Type 2 diabetes mellitus with other circulatory complications: Secondary | ICD-10-CM

## 2016-12-05 DIAGNOSIS — J432 Centrilobular emphysema: Secondary | ICD-10-CM | POA: Diagnosis not present

## 2016-12-05 DIAGNOSIS — I2510 Atherosclerotic heart disease of native coronary artery without angina pectoris: Secondary | ICD-10-CM | POA: Diagnosis not present

## 2016-12-05 DIAGNOSIS — E782 Mixed hyperlipidemia: Secondary | ICD-10-CM | POA: Diagnosis not present

## 2016-12-05 DIAGNOSIS — Z9889 Other specified postprocedural states: Secondary | ICD-10-CM

## 2016-12-05 DIAGNOSIS — I481 Persistent atrial fibrillation: Secondary | ICD-10-CM | POA: Diagnosis not present

## 2016-12-05 DIAGNOSIS — I420 Dilated cardiomyopathy: Secondary | ICD-10-CM

## 2016-12-05 DIAGNOSIS — I4819 Other persistent atrial fibrillation: Secondary | ICD-10-CM

## 2016-12-05 NOTE — Patient Instructions (Signed)
Medication Instructions:   No medication changes made  Please monitor blood pressure at home Call the office with numbers  Labwork:  No new labs needed  Testing/Procedures:  No further testing at this time   I recommend watching educational videos on topics of interest to you at:       [email or web address]  Enter code: [email or web address]    Follow-Up: It was a pleasure seeing you in the office today. Please call us if you have new issues that need to be addressed before your next appt.  [phone]  Your physician wants you to follow-up in: 6 months.  You will receive a reminder letter in the mail two months in advance. If you don't receive a letter, please call our office to schedule the follow-up appointment.  If you need a refill on your cardiac medications before your next appointment, please call your pharmacy.

## 2016-12-08 ENCOUNTER — Emergency Department
Admission: EM | Admit: 2016-12-08 | Discharge: 2016-12-08 | Disposition: A | Discharge status: Home / Self Care | Payer: Medicare Other | Attending: Emergency Medicine | Admitting: Emergency Medicine

## 2016-12-08 ENCOUNTER — Encounter: Payer: Self-pay | Admitting: Emergency Medicine

## 2016-12-08 ENCOUNTER — Emergency Department: Payer: Medicare Other

## 2016-12-08 DIAGNOSIS — Y939 Activity, unspecified: Secondary | ICD-10-CM | POA: Diagnosis not present

## 2016-12-08 DIAGNOSIS — W19XXXA Unspecified fall, initial encounter: Secondary | ICD-10-CM

## 2016-12-08 DIAGNOSIS — S060X0A Concussion without loss of consciousness, initial encounter: Secondary | ICD-10-CM | POA: Diagnosis not present

## 2016-12-08 DIAGNOSIS — S0990XA Unspecified injury of head, initial encounter: Secondary | ICD-10-CM | POA: Diagnosis present

## 2016-12-08 DIAGNOSIS — Y929 Unspecified place or not applicable: Secondary | ICD-10-CM | POA: Diagnosis not present

## 2016-12-08 DIAGNOSIS — Y999 Unspecified external cause status: Secondary | ICD-10-CM | POA: Diagnosis not present

## 2016-12-08 DIAGNOSIS — I5032 Chronic diastolic (congestive) heart failure: Secondary | ICD-10-CM | POA: Insufficient documentation

## 2016-12-08 DIAGNOSIS — J449 Chronic obstructive pulmonary disease, unspecified: Secondary | ICD-10-CM | POA: Insufficient documentation

## 2016-12-08 DIAGNOSIS — I2510 Atherosclerotic heart disease of native coronary artery without angina pectoris: Secondary | ICD-10-CM | POA: Insufficient documentation

## 2016-12-08 DIAGNOSIS — W0110XA Fall on same level from slipping, tripping and stumbling with subsequent striking against unspecified object, initial encounter: Secondary | ICD-10-CM | POA: Diagnosis not present

## 2016-12-08 DIAGNOSIS — Z85118 Personal history of other malignant neoplasm of bronchus and lung: Secondary | ICD-10-CM | POA: Diagnosis not present

## 2016-12-08 DIAGNOSIS — Z87891 Personal history of nicotine dependence: Secondary | ICD-10-CM | POA: Diagnosis not present

## 2016-12-08 DIAGNOSIS — S0003XA Contusion of scalp, initial encounter: Secondary | ICD-10-CM

## 2016-12-08 DIAGNOSIS — M26621 Arthralgia of right temporomandibular joint: Secondary | ICD-10-CM | POA: Diagnosis not present

## 2016-12-08 DIAGNOSIS — E1122 Type 2 diabetes mellitus with diabetic chronic kidney disease: Secondary | ICD-10-CM | POA: Diagnosis not present

## 2016-12-08 DIAGNOSIS — J45909 Unspecified asthma, uncomplicated: Secondary | ICD-10-CM | POA: Diagnosis not present

## 2016-12-08 DIAGNOSIS — I130 Hypertensive heart and chronic kidney disease with heart failure and stage 1 through stage 4 chronic kidney disease, or unspecified chronic kidney disease: Secondary | ICD-10-CM | POA: Diagnosis not present

## 2016-12-08 DIAGNOSIS — N183 Chronic kidney disease, stage 3 (moderate): Secondary | ICD-10-CM | POA: Insufficient documentation

## 2016-12-08 MED ORDER — HYDROCODONE-ACETAMINOPHEN 5-325 MG PO TABS: 1.0000 | ORAL_TABLET | Freq: Four times a day (QID) | ORAL | 0 refills | Status: DC | PRN

## 2016-12-08 MED ORDER — PREDNISONE 10 MG PO TABS: 10.0000 mg | ORAL_TABLET | Freq: Every day | ORAL | 0 refills | Status: DC

## 2016-12-08 NOTE — ED Provider Notes (Signed)
[facility] Provider Note   CSN: [id number] Arrival date & time: 12/08/16  1618     History   Chief Complaint Chief Complaint  Patient presents with  . Head Injury    HPI Regina Burke is a 83 y.o. female presents to the emergency department for evaluation of fall. Patient had a mechanical fall just prior to arrival. She was walking down a single step when she slipped, fell backwards onto her buttocks hitting her head on the floor. She is on liquids. She has a mild posterior headache. She denies any loss of consciousness, nausea, vomiting, vision changes, neck pain, numbness tingling or radicular symptoms in the lower extremity is. Mild soreness to her tailbone but no hip pain. She is able to stand and ambulate. She denies any chest pain, shortness of breath. Patient states she did not want to come to the hospital per her family made her. She states that the pain that is bothering her the most is her right jaw along the right TMJ. She denies any fevers hearing loss or ear drainage. She has not been taking any medications for pain. TMJ pain has been present for weeks.  HPI  Past Medical History:  Diagnosis Date  . Anemia   . Arthritis   . Asthma   . Cardiac resynchronization therapy pacemaker (CRT-P) in place    a. 03/31/16:  Medtronic Percepta Quad CRT-P MRI SureScan (serial Number [id number]) device.  . Chronic diastolic CHF (congestive heart failure) ([place])    a. 10/2015 Echo: EF 55-65%, Gr1 DD, mild MR, mildly dil LA, nl RV fxn, nl PASP.  Marland Kitchen Chronic kidney disease (CKD), stage III (moderate)   . COPD (chronic obstructive pulmonary disease) ([place])   . Coronary artery disease    a. 11/2014 NSTEMI/PCI: LM nl, LAD 38m D1 30, LCX mild dzs, OM1 20p, OM2 228mOM3 90p (2.25x8 Promus Premier DES), RCA nl.   . Cough    CHRONIC AT NIGHT  . Cushing's disease ([facility]  . Depression   . Diverticulitis   . Dyspnea   . Dysrhythmia   . Edema    FEET/LEGS  . GERD  (gastroesophageal reflux disease)   . Gout   . Gout   . Heart murmur   . History of hiatal hernia   . History of pneumonia   . HLD (hyperlipidemia)   . HOH (hard of hearing)   . Hypertension   . Hypertensive heart disease   . Lung cancer (HC[place]aremontdx'd 2014   S/P radiation 2015  . Migraine   . Multiple allergies   . Myocardial infarction   . Oxygen deficiency    2LITERS  . Persistent atrial fibrillation (HCC)    a. on amio, Toprol XL, Cardizem CD, and Eliquis 2.5 mg bid (age & SCr); b. CHADS2VASc ==> 7 (CHF, HTN, age x 2, DM, vascular disease and sex category)  . Presence of permanent cardiac pacemaker   . S/P AV nodal ablation    a. on 03/31/16 for persistant afib with CRT-P placement  . Sleep apnea   . [facility]    Patient Active Problem List   Diagnosis Date Noted  . LBBB (left bundle branch block) 04/01/2016  . S/P AV nodal ablation   . Cardiac resynchronization therapy pacemaker (CRT-P) in place   . Persistent atrial fibrillation (HCR[place] CHA2DS2-Vasc = ~7. On Xarelto 15 mg (age & renal Fxn) 03/18/2016  . Congestive dilated cardiomyopathy (HCSwink07/22/2017  Class: Temporary  . DNR (do not resuscitate) discussion   . Palliative care encounter   . Persistent atrial fibrillation ([place])   . Chronic diastolic CHF (congestive heart failure) ([place]) 02/16/2016  . HLD (hyperlipidemia) 02/16/2016  . Iron deficiency anemia due to chronic blood loss   . Essential hypertension   . Dyspnea 11/25/2015  . Diaphoresis   . Renal insufficiency   . Centrilobular emphysema ([place])   . Chronic obstructive pulmonary disease ([place]) 07/14/2015  . Status post thoracentesis   . S/P thoracentesis   . H/O: lung cancer   . Pleural effusion   . Coronary artery disease involving native coronary artery of native heart without angina pectoris   . S/P coronary artery stent placement   . Pressure ulcer 06/08/2015  . Dehydration 05/09/2015  . Diabetes ([place]) 05/09/2015  . Closed  fracture nasal bone 05/09/2015  . Cancer of upper lobe of right lung ([place]) 01/08/2015  . Allergic state 01/08/2015  . Anemia associated with chronic renal failure 01/08/2015  . Chronic kidney disease 01/08/2015  . CAFL (chronic airflow limitation) ([place]) 01/08/2015  . Arthritis, degenerative 01/08/2015  . 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 Diabetes mellitus, type 2 ([place]) 09/11/2014  . Type 2 diabetes mellitus ([place]) 09/11/2014    Past Surgical History:  Procedure Laterality Date  . ABDOMINAL HYSTERECTOMY    . ABLATION     July 2017  . ADRENALECTOMY Left 1980's   "[place]"  . APPENDECTOMY    . BREAST CYST EXCISION Left   . CATARACT EXTRACTION W/PHACO Right 12/28/2015   Procedure: CATARACT EXTRACTION PHACO AND INTRAOCULAR LENS PLACEMENT (IOC);  Surgeon: Birder Robson, MD;  Location: [facility] ORS;  Service: Ophthalmology;  Laterality: Right;  [place] 48.4   . CATARACT EXTRACTION W/PHACO Left 11/14/2016   Procedure: CATARACT EXTRACTION PHACO AND INTRAOCULAR LENS PLACEMENT (IOC);  Surgeon: Birder Robson, MD;  Location: [facility] ORS;  Service: Ophthalmology;  Laterality: Left;  [place] 59.8 AP% 18.1 CDE 10.78 Fluid pack lot # [id number]  . CHOLECYSTECTOMY    . CORONARY ANGIOPLASTY WITH STENT PLACEMENT  12/23/2014  . ELECTROPHYSIOLOGIC STUDY N/A 03/08/2016   Procedure: CARDIOVERSION;  Surgeon: Wende Bushy, MD;  Location: [facility] ORS;  Service: Cardiovascular;  Laterality: N/A;  . ELECTROPHYSIOLOGIC STUDY N/A 03/07/2016   Procedure: Cardioversion;  Surgeon: Wende Bushy, MD;  Location: [facility] ORS;  Service: Cardiovascular;  Laterality: N/A;  . ELECTROPHYSIOLOGIC STUDY N/A 03/31/2016   Procedure: AV Node Ablation;  Surgeon: Will Meredith Leeds, MD;  Location: [facility] CV LAB;  Service: Cardiovascular;  Laterality: N/A;  . EP IMPLANTABLE DEVICE N/A 03/31/2016   Procedure: BiV Pacemaker Insertion CRT-P;  Surgeon: Will Meredith Leeds, MD;  Location: [facility];  Service: Cardiovascular;  Laterality:  N/A;  . FRACTURE SURGERY    . INSERT / REPLACE / REMOVE PACEMAKER  02/2016  . LEFT HEART CATHETERIZATION WITH CORONARY ANGIOGRAM N/A 12/23/2014   Procedure: LEFT HEART CATHETERIZATION WITH CORONARY ANGIOGRAM;  Surgeon: Burnell Blanks, MD;  Location: [facility] LAB;  Service: Cardiovascular;  Laterality: N/A;  . PERCUTANEOUS CORONARY STENT INTERVENTION (PCI-S)  12/23/2014   Procedure: PERCUTANEOUS CORONARY STENT INTERVENTION (PCI-S);  Surgeon: Burnell Blanks, MD;  Location: [facility] CATH LAB;  Service: Cardiovascular;;  Promus 2.25x8  . TONSILLECTOMY    . TRANSTHORACIC ECHOCARDIOGRAM  11/26/2015   Technically difficult study. EF 55-60%. Normal wall motion. GR 1 DD.  . TUBAL LIGATION    . WRIST FRACTURE SURGERY Bilateral ~ 2000    OB History  No data available       Home Medications    Prior to Admission medications   Medication Sig Start Date End Date Taking? Authorizing Provider  allopurinol (ZYLOPRIM) 100 MG tablet Take 100 mg by mouth daily. 10/30/16 10/30/17  Historical Provider, MD  ALPRAZolam Duanne Moron) 0.25 MG tablet Take 1 tablet (0.25 mg total) by mouth at bedtime as needed for anxiety. Patient taking differently: Take 0.5 mg by mouth at bedtime as needed for anxiety.  04/03/16   Renee Dyane Dustman, PA-C  apixaban (ELIQUIS) 2.5 MG TABS tablet Take 1 tablet (2.5 mg total) by mouth every 12 (twelve) hours. 08/01/16   Minna Merritts, MD  atorvastatin (LIPITOR) 20 MG tablet Take 1 tablet (20 mg total) by mouth every evening. 12/03/15   Minna Merritts, MD  cholecalciferol (VITAMIN D) 400 UNITS TABS tablet Take 400 Units by mouth every evening.     Historical Provider, MD  cyanocobalamin 500 MCG tablet Take 500 mcg by mouth every evening.    Historical Provider, MD  fenofibrate (TRICOR) 48 MG tablet Take 48 mg by mouth every evening.     Historical Provider, MD  ferrous sulfate 325 (65 FE) MG EC tablet Take 325 mg by mouth every evening.     Historical Provider, MD  fluticasone  furoate-vilanterol (BREO ELLIPTA) 100-25 MCG/INH AEPB Inhale 1 puff into the lungs daily.    Historical Provider, MD  furosemide (LASIX) 40 MG tablet Take 1 tablet (40 mg total) by mouth daily. Patient taking differently: Take 20 mg by mouth daily.  04/01/16   Eileen Stanford, PA-C  HYDROcodone-acetaminophen (NORCO) 5-325 MG tablet Take 1 tablet by mouth every 6 (six) hours as needed for moderate pain. 12/08/16   Duanne Guess, PA-C  Multiple Vitamins-Minerals (PRESERVISION AREDS 2) CAPS Take 1 capsule by mouth daily.    Historical Provider, MD  nitroGLYCERIN (NITROSTAT) 0.4 MG SL tablet Place 1 tablet (0.4 mg total) under the tongue every 5 (five) minutes as needed for chest pain. 12/24/14   Rhonda G Barrett, PA-C  pantoprazole (PROTONIX) 40 MG tablet Take 40 mg by mouth every evening.     Historical Provider, MD  predniSONE (DELTASONE) 10 MG tablet Take 1 tablet (10 mg total) by mouth daily. 6,5,4,3,2,1 six day taper 12/08/16   Duanne Guess, PA-C  sertraline (ZOLOFT) 50 MG tablet Take 1 tablet (50 mg total) by mouth daily. 03/24/16   Gladstone Lighter, MD  tiotropium (SPIRIVA) 18 MCG inhalation capsule Place 18 mcg into inhaler and inhale daily.    Historical Provider, MD    Family History Family History  Problem Relation Age of Onset  . Heart disease Mother   . Diabetes Mother   . Osteoarthritis Mother   . Hypertension Mother   . Heart disease Father   . Hypertension Father   . COPD Brother     Social History Social History  Substance Use Topics  . Smoking status: Former Smoker    Packs/day: 1.00    Years: 45.00    Types: Cigarettes    Quit date: 04/25/1994  . Smokeless tobacco: Never Used  . Alcohol use No     Comment: 12/23/2014 "might have a couple mixed drinks/year"     Allergies   Ciprofloxacin; Doxycycline; Penicillins; Sulfa antibiotics; Latex; Morphine and related; Albuterol sulfate; and Cefuroxime   Review of Systems Review of Systems  Constitutional: Negative  for activity change, chills, fatigue and fever.  HENT: Positive for ear pain (right TMJ). Negative  for congestion, rhinorrhea, sinus pressure and sore throat.   Eyes: Negative for visual disturbance.  Respiratory: Negative for cough, chest tightness and shortness of breath.   Cardiovascular: Negative for chest pain and leg swelling.  Gastrointestinal: Negative for abdominal pain, diarrhea, nausea and vomiting.  Genitourinary: Negative for dysuria.  Musculoskeletal: Negative for arthralgias, back pain, gait problem and neck pain.  Skin: Negative for rash and wound.  Neurological: Positive for headaches (mild). Negative for dizziness, weakness, light-headedness and numbness.  Hematological: Negative for adenopathy.  Psychiatric/Behavioral: Negative for agitation, behavioral problems and confusion.     Physical Exam Updated Vital Signs BP 138/71 (BP Location: Right Arm)   Pulse 70   Temp 98.8 F (37.1 C) (Oral)   Resp 20   Ht '5\' 4"'$  (1.626 m)   Wt 85.3 kg   SpO2 96%   BMI 32.27 kg/m   Physical Exam  Constitutional: She is oriented to person, place, and time. She appears well-developed and well-nourished. No distress.  Patient able to stand and ambulate with no signs of pain or discomfort.  HENT:  Head: Normocephalic and atraumatic.  Right Ear: Hearing, tympanic membrane, external ear and ear canal normal.  Left Ear: Hearing, tympanic membrane, external ear and ear canal normal.  Nose: Nose normal.  Mouth/Throat: Oropharynx is clear and moist. No oropharyngeal exudate.  Point tender along the right TMJ, pain with opening and closing the jaw. No swelling warmth or redness. No fluctuance.  Eyes: Conjunctivae and EOM are normal. Pupils are equal, round, and reactive to light. Right eye exhibits no discharge. Left eye exhibits no discharge.  Neck: Normal range of motion. Neck supple.  Cardiovascular: Normal rate, regular rhythm, normal heart sounds and intact distal pulses.     Pulmonary/Chest: Effort normal and breath sounds normal. No respiratory distress. She has no wheezes. She exhibits no tenderness.  Abdominal: Soft. She exhibits no distension. There is no tenderness.  Musculoskeletal: Normal range of motion. She exhibits no edema.  Patient with full range of motion of both hips with internal and external rotation. She is minimally tender along the tip of the tailbone with no grimacing to palpation. She has no tenderness along the inferior. It reminds bilaterally. No pain with standing. Mild pain with sitting. She is nontender along the cervical thoracic or lumbar spinous process.  Neurological: She is alert and oriented to person, place, and time. She has normal reflexes. No cranial nerve deficit. Coordination normal.  Skin: Skin is warm and dry.  Psychiatric: She has a normal mood and affect. Her behavior is normal. Judgment and thought content normal.     ED Treatments / Results  Labs (all labs ordered are listed, but only abnormal results are displayed) Labs Reviewed - No data to display  EKG  EKG Interpretation None       Radiology Ct Head Wo Contrast  Result Date: 12/08/2016 CLINICAL DATA:  Slipped and fell and hit back of head. EXAM: CT HEAD WITHOUT CONTRAST CT CERVICAL SPINE WITHOUT CONTRAST TECHNIQUE: Multidetector CT imaging of the head and cervical spine was performed following the standard protocol without intravenous contrast. Multiplanar CT image reconstructions of the cervical spine were also generated. COMPARISON:  05/08/2015 FINDINGS: CT HEAD FINDINGS Brain: Stable age related cerebral atrophy, ventriculomegaly and periventricular white matter disease. No extra-axial fluid collections are identified. No CT findings for acute hemispheric infarction or intracranial hemorrhage. No mass lesions. The brainstem and cerebellum are normal. Vascular: No hyperdense vessel or unexpected calcification. Skull: Remote surgical changes  from a right-sided  craniotomy. No acute skull fracture. Sinuses/Orbits: The paranasal sinuses and mastoid air cells are clear. Other: Small posterior parietal scalp hematoma without underlying skull fracture or radiopaque foreign body. CT CERVICAL SPINE FINDINGS Exam is somewhat limited by patient motion. Alignment: Grossly normal. Skull base and vertebrae: No obvious fracture. Soft tissues and spinal canal: No abnormal prevertebral soft tissue swelling or evidence intraspinal hematoma. Disc levels: No is large disc protrusions plana spinal foraminal stenosis. Upper chest: The no significant findings. Other: None IMPRESSION: 1. No acute intracranial findings or skull fracture. 2. Stable age related cerebral atrophy, ventriculomegaly and periventricular white matter disease. 3. Limited C-spine CT due to patient motion but no obvious cervical spine fracture. Electronically Signed   By: Marijo Sanes M.D.   On: 12/08/2016 16:56   Ct Cervical Spine Wo Contrast  Result Date: 12/08/2016 CLINICAL DATA:  Slipped and fell and hit back of head. EXAM: CT HEAD WITHOUT CONTRAST CT CERVICAL SPINE WITHOUT CONTRAST TECHNIQUE: Multidetector CT imaging of the head and cervical spine was performed following the standard protocol without intravenous contrast. Multiplanar CT image reconstructions of the cervical spine were also generated. COMPARISON:  05/08/2015 FINDINGS: CT HEAD FINDINGS Brain: Stable age related cerebral atrophy, ventriculomegaly and periventricular white matter disease. No extra-axial fluid collections are identified. No CT findings for acute hemispheric infarction or intracranial hemorrhage. No mass lesions. The brainstem and cerebellum are normal. Vascular: No hyperdense vessel or unexpected calcification. Skull: Remote surgical changes from a right-sided craniotomy. No acute skull fracture. Sinuses/Orbits: The paranasal sinuses and mastoid air cells are clear. Other: Small posterior parietal scalp hematoma without underlying  skull fracture or radiopaque foreign body. CT CERVICAL SPINE FINDINGS Exam is somewhat limited by patient motion. Alignment: Grossly normal. Skull base and vertebrae: No obvious fracture. Soft tissues and spinal canal: No abnormal prevertebral soft tissue swelling or evidence intraspinal hematoma. Disc levels: No is large disc protrusions plana spinal foraminal stenosis. Upper chest: The no significant findings. Other: None IMPRESSION: 1. No acute intracranial findings or skull fracture. 2. Stable age related cerebral atrophy, ventriculomegaly and periventricular white matter disease. 3. Limited C-spine CT due to patient motion but no obvious cervical spine fracture. Electronically Signed   By: Marijo Sanes M.D.   On: 12/08/2016 16:56    Procedures Procedures (including critical care time)  Medications Ordered in ED Medications - No data to display   Initial Impression / Assessment and Plan / ED Course  I have reviewed the triage vital signs and the nursing notes.  Pertinent labs & imaging results that were available during my care of the patient were reviewed by me and considered in my medical decision making (see chart for details).     83 year old female with fall just prior to arrival. Her chief complaint is right TMJ pain that has been present for several weeks. She is placed on prednisone taper, unable tolerate NSAIDs due to being on blood thinners. Patient had a mechanical fall with hematoma to the posterior scalp. She will apply ice to the area, she is educated on signs and symptoms to return to the emergency department for. CT of her head and neck were negative. Discussed imaging for sacrum and coccyx, patient not wanting to proceed with x-rays at this time. Patient will follow-up with PCP for any pain or discomfort X week. She understands signs and symptoms return to the ED for.  Final Clinical Impressions(s) / ED Diagnoses   Final diagnoses:  Contusion of scalp, initial encounter  Fall, initial encounter  Hematoma of scalp, initial encounter  Concussion without loss of consciousness, initial encounter  Arthralgia of right temporomandibular joint    New Prescriptions New Prescriptions   HYDROCODONE-ACETAMINOPHEN (NORCO) 5-325 MG TABLET    Take 1 tablet by mouth every 6 (six) hours as needed for moderate pain.   PREDNISONE (DELTASONE) 10 MG TABLET    Take 1 tablet (10 mg total) by mouth daily. 6,5,4,3,2,1 six day taper     Duanne Guess, PA-C 12/08/16 [place], MD 12/08/16 2125

## 2016-12-08 NOTE — Discharge Instructions (Signed)
Please apply ice to the posterior scalp. Take Norco as needed for severe pain. Return to the ER for any vomiting, headache, vision changes, worsening symptoms urgent changes in her health. Please eat a soft diet, take prednisone as prescribed.

## 2016-12-08 NOTE — ED Triage Notes (Signed)
Pt reports slipping and falling backwards, hitting head on hardwood floor. Pt is currently on eliquis. Pt denies LOC, reports headache.

## 2016-12-08 NOTE — ED Notes (Addendum)
FIRST NURSE:  Pt fell back and hit her head about 40 min ago, pt takes eliquis, no loc.

## 2016-12-11 ENCOUNTER — Ambulatory Visit: Payer: Medicare Other

## 2016-12-11 ENCOUNTER — Other Ambulatory Visit: Payer: Medicare Other

## 2016-12-18 ENCOUNTER — Telehealth: Payer: Self-pay | Admitting: Cardiovascular Disease

## 2016-12-18 NOTE — Telephone Encounter (Signed)
Spoke w/ [name].  He reports that pt's BP was high at her last ov, pt does not check this at home.  He reports that pt does not feel that her afib is getting better and she would like know if meds need to be restarted, possibly metoprolol. He would like to get her in to be seen sooner than 12/26/16. Added pt to wait list in the event of a cancellation.  He had Dr. Ginette Pitman on the other line and cut our call short.  Advised him that I will call if we have any openings before that time.

## 2016-12-18 NOTE — Telephone Encounter (Signed)
Pt son states pt BP is elevated.  He states he does not have any readings, but will bring to pt appt on the 1st of May. He states pt just over all isnt feeling great. States pt fell about a week ago. She did go to ED. Pt states she is feeling fluttering since she has gotten her pacer.

## 2016-12-19 ENCOUNTER — Encounter: Payer: Self-pay | Admitting: Cardiovascular Disease

## 2016-12-19 ENCOUNTER — Ambulatory Visit (INDEPENDENT_AMBULATORY_CARE_PROVIDER_SITE_OTHER): Payer: Medicare Other | Admitting: Cardiovascular Disease

## 2016-12-19 VITALS — BP 140/60 | HR 74 | Ht 64.0 in | Wt 189.5 lb

## 2016-12-19 DIAGNOSIS — I4819 Other persistent atrial fibrillation: Secondary | ICD-10-CM

## 2016-12-19 DIAGNOSIS — I10 Essential (primary) hypertension: Secondary | ICD-10-CM | POA: Diagnosis not present

## 2016-12-19 DIAGNOSIS — I481 Persistent atrial fibrillation: Secondary | ICD-10-CM

## 2016-12-19 DIAGNOSIS — I2510 Atherosclerotic heart disease of native coronary artery without angina pectoris: Secondary | ICD-10-CM | POA: Diagnosis not present

## 2016-12-19 DIAGNOSIS — I447 Left bundle-branch block, unspecified: Secondary | ICD-10-CM

## 2016-12-19 DIAGNOSIS — I420 Dilated cardiomyopathy: Secondary | ICD-10-CM | POA: Diagnosis not present

## 2016-12-19 DIAGNOSIS — I5032 Chronic diastolic (congestive) heart failure: Secondary | ICD-10-CM | POA: Diagnosis not present

## 2016-12-19 NOTE — Progress Notes (Signed)
Cardiology Office Note  Date:  12/19/2016   ID:  [name], DOB October 28, 1932, MRN [id number]  PCP:  Tracie Harrier, MD   Chief Complaint  Patient presents with  . other    early follow up. Patient c/o elevated BP and feeling fluttering sensation since getting pacer. Meds reviewed verbally with patient.     HPI:   Regina Burke is a 83 y.o. female With  COPD on chronic oxygen,    coronary artery disease with hospitalization April 2016 stent placed to marginal branch of the circumflex, Persistent atrial fibrillation,  03/31/2016, AV node ablation,  Biventricular ICD implantation severe anemia, previous epo shot lung cancer, diabetes mellitus,  episode of acute respiratory distress requiring hospitalization  who presents for routine follow-up of her atrial fibrillation, chronic diastolic CHF  Fell 11 days ago, Missed the step Hit the back of your head Went to  the emergency room for CT scan of head  Records reviewed   Taking lasix 40 with 20 alternating  was told by primary care to stop Lasix given her renal dysfunction  She has not stopped her Lasix yet   She is sleeping in her recliner with oxygen , unable to sleep flat in the bed   Nausea, night sweats . Symptoms started after she started  a new gout pill  Metoprolol was held by Dr. Caryl Comes Dec 2017 [place] like she needs metoprolol for palpitations Sometimes blood pressure running high   No regular exercise program ChronicLower back pain, central back pain, worse after she has been standing for long periods of time  Uses a cane.   Denies any chest pain Feels her breathing is stable  EKG on today's visit shows atrial fibrillation/flutter, paced rhythm at 74 bpm  Other past medical history reviewed  admission to [facility] with acute respiratory distress, pneumonia, bilateral pleural effusions requiring thoracentesis, acute on chronic diastolic heart failure,   hospital admission in August 2016  for electrolyte  abnormality, digoxin toxicity, readmitted 06/07/2015 with shortness of breath, found to have atrial fibrillation , potassium of 7, renal failure, anemia who presents for routine follow-up after recent hospitalization for atrial fibrillation with RVR  Previously on amiodarone infusion for atrial fibrillation , converted shortly after to normal sinus rhythm She did have IV infiltration of the left forearm, had to go back to the emergency room and was treated with a special regiment as detailed in the notes.  Hospital records reviewed from admission October 2016 at [facility] recent hospitalization for respiratory distress, etiology Likely multifactorial including profound anemia, atrial fibrillation, severe COPD, pleural effusions . she has not qualified for BiPAP, "did the wrong sleep study (CPAP)."  In the hospital had significant anemia, blood count down to hemoglobin 6.7  Was in atrial fibrillation but Converted to normal sinus rhythm after thoracentesis (first one) Breathing better after second thoracentesis   She did have Digoxin toxicity, This was not restarted at discharge   Presented to [facility] on  12/22/2014 with chest pain.   cardiac catheterization  found to have mild 50% disease in the diagonal branch of the LAD, but high-grade 90% hazy stenosis in the marginal branch of the circumflex.  She underwent successful PTCA and insertion of a 2.25x 8 millimeter Promus premier DES stent at the ostium of the OM3 vessell which was postdilated to 2.5 mm.   PMH:   has a past medical history of Anemia; Arthritis; Asthma; Cardiac resynchronization therapy pacemaker (CRT-P) in place; Chronic diastolic CHF (congestive heart failure) ([place]);  Chronic kidney disease (CKD), stage III (moderate); COPD (chronic obstructive pulmonary disease) ([place]); Coronary artery disease; Cough; Cushing's disease ([place]); Depression; Diverticulitis; Dyspnea; Dysrhythmia; Edema; GERD (gastroesophageal reflux disease); Gout; Gout;  Heart murmur; History of hiatal hernia; History of pneumonia; HLD (hyperlipidemia); HOH (hard of hearing); Hypertension; Hypertensive heart disease; Lung cancer ([place]) (dx'd 2014); Migraine; Multiple allergies; Myocardial infarction ([place]); Oxygen deficiency; Persistent atrial fibrillation ([place]); Presence of permanent cardiac pacemaker; S/P AV nodal ablation; Sleep apnea; and Type II diabetes mellitus ([place]).  PSH:    Past Surgical History:  Procedure Laterality Date  . ABDOMINAL HYSTERECTOMY    . ABLATION     July 2017  . ADRENALECTOMY Left 1980's   "[place]"  . APPENDECTOMY    . BREAST CYST EXCISION Left   . CATARACT EXTRACTION W/PHACO Right 12/28/2015   Procedure: CATARACT EXTRACTION PHACO AND INTRAOCULAR LENS PLACEMENT (IOC);  Surgeon: Birder Robson, MD;  Location: [facility] ORS;  Service: Ophthalmology;  Laterality: Right;  [place] 48.4   . CATARACT EXTRACTION W/PHACO Left 11/14/2016   Procedure: CATARACT EXTRACTION PHACO AND INTRAOCULAR LENS PLACEMENT (IOC);  Surgeon: Birder Robson, MD;  Location: [facility] ORS;  Service: Ophthalmology;  Laterality: Left;  [place] 59.8 AP% 18.1 CDE 10.78 Fluid pack lot # [id number]  . CHOLECYSTECTOMY    . CORONARY ANGIOPLASTY WITH STENT PLACEMENT  12/23/2014  . ELECTROPHYSIOLOGIC STUDY N/A 03/08/2016   Procedure: CARDIOVERSION;  Surgeon: Wende Bushy, MD;  Location: [facility] ORS;  Service: Cardiovascular;  Laterality: N/A;  . ELECTROPHYSIOLOGIC STUDY N/A 03/07/2016   Procedure: Cardioversion;  Surgeon: Wende Bushy, MD;  Location: [facility] ORS;  Service: Cardiovascular;  Laterality: N/A;  . ELECTROPHYSIOLOGIC STUDY N/A 03/31/2016   Procedure: AV Node Ablation;  Surgeon: Will Meredith Leeds, MD;  Location: [place] CV LAB;  Service: Cardiovascular;  Laterality: N/A;  . EP IMPLANTABLE DEVICE N/A 03/31/2016   Procedure: BiV Pacemaker Insertion CRT-P;  Surgeon: Will Meredith Leeds, MD;  Location: [place] [facility];  Service: Cardiovascular;  Laterality: N/A;  . FRACTURE SURGERY     . INSERT / REPLACE / REMOVE PACEMAKER  02/2016  . LEFT HEART CATHETERIZATION WITH CORONARY ANGIOGRAM N/A 12/23/2014   Procedure: LEFT HEART CATHETERIZATION WITH CORONARY ANGIOGRAM;  Surgeon: Burnell Blanks, MD;  Location: [facility] CATH LAB;  Service: Cardiovascular;  Laterality: N/A;  . PERCUTANEOUS CORONARY STENT INTERVENTION (PCI-S)  12/23/2014   Procedure: PERCUTANEOUS CORONARY STENT INTERVENTION (PCI-S);  Surgeon: Burnell Blanks, MD;  Location: [facility] CATH LAB;  Service: Cardiovascular;;  Promus 2.25x8  . TONSILLECTOMY    . TRANSTHORACIC ECHOCARDIOGRAM  11/26/2015   Technically difficult study. EF 55-60%. Normal wall motion. GR 1 DD.  . TUBAL LIGATION    . WRIST FRACTURE SURGERY Bilateral ~ 2000    Current Outpatient Prescriptions  Medication Sig Dispense Refill  . allopurinol (ZYLOPRIM) 100 MG tablet Take 100 mg by mouth daily.    Marland Kitchen ALPRAZolam (XANAX) 0.25 MG tablet Take 1 tablet (0.25 mg total) by mouth at bedtime as needed for anxiety. (Patient taking differently: Take 0.5 mg by mouth at bedtime as needed for anxiety. ) 7 tablet 0  . apixaban (ELIQUIS) 2.5 MG TABS tablet Take 1 tablet (2.5 mg total) by mouth every 12 (twelve) hours. 180 tablet 3  . atorvastatin (LIPITOR) 20 MG tablet Take 1 tablet (20 mg total) by mouth every evening. 90 tablet 3  . cholecalciferol (VITAMIN D) 400 UNITS TABS tablet Take 400 Units by mouth every evening.     . cyanocobalamin 500 MCG tablet Take 500  mcg by mouth every evening.    . fenofibrate (TRICOR) 48 MG tablet Take 48 mg by mouth every evening.     . ferrous sulfate 325 (65 FE) MG EC tablet Take 325 mg by mouth every evening.     . fluticasone furoate-vilanterol (BREO ELLIPTA) 100-25 MCG/INH AEPB Inhale 1 puff into the lungs daily.    . furosemide (LASIX) 40 MG tablet Take 1 tablet (40 mg total) by mouth daily. (Patient taking differently: Take 20 mg by mouth daily. ) 30 tablet 6  . HYDROcodone-acetaminophen (NORCO) 5-325 MG tablet Take 1  tablet by mouth every 6 (six) hours as needed for moderate pain. 20 tablet 0  . Multiple Vitamins-Minerals (PRESERVISION AREDS 2) CAPS Take 1 capsule by mouth daily.    . nitroGLYCERIN (NITROSTAT) 0.4 MG SL tablet Place 1 tablet (0.4 mg total) under the tongue every 5 (five) minutes as needed for chest pain. 25 tablet 3  . pantoprazole (PROTONIX) 40 MG tablet Take 40 mg by mouth every evening.     . predniSONE (DELTASONE) 10 MG tablet Take 1 tablet (10 mg total) by mouth daily. 6,5,4,3,2,1 six day taper 21 tablet 0  . sertraline (ZOLOFT) 50 MG tablet Take 1 tablet (50 mg total) by mouth daily. 30 tablet 2  . tiotropium (SPIRIVA) 18 MCG inhalation capsule Place 18 mcg into inhaler and inhale daily.     No current facility-administered medications for this visit.      Allergies:   Ciprofloxacin; Doxycycline; Penicillins; Sulfa antibiotics; Latex; Morphine and related; Albuterol sulfate; and Cefuroxime   Social History:  The patient  reports that she quit smoking about 22 years ago. Her smoking use included Cigarettes. She has a 45.00 pack-year smoking history. She has never used smokeless tobacco. She reports that she does not drink alcohol or use drugs.   Family History:   family history includes COPD in her brother; Diabetes in her mother; Heart disease in her father and mother; Hypertension in her father and mother; Osteoarthritis in her mother.    Review of Systems: Review of Systems  Constitutional: Positive for malaise/fatigue.  Respiratory: Positive for shortness of breath.   Cardiovascular: Negative.   Gastrointestinal: Negative.   Musculoskeletal: Negative.   Neurological: Positive for weakness.  Psychiatric/Behavioral: Positive for depression.  All other systems reviewed and are negative.    PHYSICAL EXAM: VS:  BP 140/60 (BP Location: Right Arm, Patient Position: Sitting, Cuff Size: Normal)   Pulse 74   Ht '5\' 4"'$  (1.626 m)   Wt 189 lb 8 oz (86 kg)   BMI 32.53 kg/m  , BMI  Body mass index is 32.53 kg/m. GEN: Well nourished, well developed, in no acute distress ,  Cane, obese unsteady gait  HEENT: normal  Neck: no JVD, carotid bruits, or masses Cardiac: RRR; no murmurs, rubs, or gallops,no edema  Respiratory:  clear to auscultation bilaterally, normal work of breathing GI: soft, nontender, nondistended, + BS MS: no deformity or atrophy  Skin: warm and dry, no rash Neuro:  Strength and sensation are intact Psych: euthymic mood, full affect    Recent Labs: 03/17/2016: B Natriuretic Peptide 548.0; Magnesium 2.1 03/18/2016: ALT 69 09/20/2016: BUN 65; Creatinine, Ser 2.59; Potassium 4.1; Sodium 137 12/04/2016: Hemoglobin 10.2; Platelets 162    Lipid Panel Lab Results  Component Value Date   CHOL 202 (H) 12/23/2014   HDL 28 (L) 12/23/2014   LDLCALC 107 (H) 12/23/2014   TRIG 337 (H) 12/23/2014      Wt  Readings from Last 3 Encounters:  12/19/16 189 lb 8 oz (86 kg)  12/08/16 188 lb (85.3 kg)  12/05/16 188 lb 8 oz (85.5 kg)       ASSESSMENT AND PLAN:  Chronic diastolic CHF (congestive heart failure) (HCC) - Plan: EKG 12-Lead Appears  euvolemic  continue Lasix 20 mg daily For weight gain increase dose up to 40 mg Currently not on ACE inhibitor, ARB given renal dysfunction  Persistent atrial fibrillation ([place]) - Plan: EKG 12-Lead s/p AV node ablation, ICD Tolerating anticoagulation She is requesting low-dose beta blocker for palpitations. We have suggested she try metoprolol succinate 12.5 mg daily   Coronary artery disease involving native coronary artery of native heart without angina pectoris Currently with no symptoms of angina. No further workup at this time. Continue current medication regimen.  Essential hypertension We will add very low-dose metoprolol succinate at her request  S/P AV nodal ablation Doing well following her procedure  Iron deficiency anemia due to chronic blood loss Previously received Epogen  Chronic obstructive  pulmonary disease, unspecified COPD type ([place]) She denies any recent COPD exacerbation On chronic oxygen    Total encounter time more than 25 minutes  Greater than 50% was spent in counseling and coordination of care with the patient   Disposition:   F/U  6 months   Orders Placed This Encounter  Procedures  . EKG 12-Lead     Signed, Esmond Plants, M.D., Ph.D. 12/19/2016  [place], [place]

## 2016-12-19 NOTE — Telephone Encounter (Signed)
Pt added on to see Dr. Rockey Situ today @ 3:00.

## 2016-12-19 NOTE — Patient Instructions (Signed)
Medication Instructions:   Add metoprolol 12.5 mg once a day  Try the lasix 20 mg daily If fluid comes back, Go back to alternating 40 with 20 mg  Stop the gout pill for night sweats    Labwork:  No new labs needed  Testing/Procedures:  No further testing at this time   I recommend watching educational videos on topics of interest to you at:       [email or web address]  Enter code: HEARTCARE    Follow-Up: It was a pleasure seeing you in the office today. Please call us if you have new issues that need to be addressed before your next appt.  [phone]  Your physician wants you to follow-up in: 6 months.  You will receive a reminder letter in the mail two months in advance. If you don't receive a letter, please call our office to schedule the follow-up appointment.  If you need a refill on your cardiac medications before your next appointment, please call your pharmacy.

## 2016-12-25 ENCOUNTER — Ambulatory Visit: Payer: Medicare Other

## 2016-12-25 ENCOUNTER — Other Ambulatory Visit: Payer: Medicare Other

## 2016-12-26 ENCOUNTER — Ambulatory Visit: Payer: Medicare Other | Admitting: Cardiovascular Disease

## 2016-12-27 ENCOUNTER — Telehealth: Payer: Self-pay | Admitting: Internal Medicine

## 2016-12-27 NOTE — Telephone Encounter (Signed)
Spoke with son and he states pt is having more SOB and that pt has seen PCP and [place]. Scheduled pt an acute visit for 12/28/16 with DR. Informed son if pt got worse between now and appt to call 911 or take her to the ER. Son verbalized understanding. Nothing further needed.

## 2016-12-27 NOTE — Telephone Encounter (Signed)
Pt son calling stating pt SOB is not getting any better. He is very worried.  Its not getting any better  Please advise

## 2016-12-28 ENCOUNTER — Ambulatory Visit
Admission: RE | Admit: 2016-12-28 | Discharge: 2016-12-28 | Disposition: A | Discharge status: Home / Self Care | Payer: Medicare Other | Source: Ambulatory Visit | Attending: Internal Medicine | Admitting: Internal Medicine

## 2016-12-28 ENCOUNTER — Ambulatory Visit (INDEPENDENT_AMBULATORY_CARE_PROVIDER_SITE_OTHER): Payer: Medicare Other | Admitting: Internal Medicine

## 2016-12-28 ENCOUNTER — Encounter: Payer: Self-pay | Admitting: Internal Medicine

## 2016-12-28 VITALS — BP 130/60 | HR 79 | Resp 16 | Ht 64.0 in | Wt 190.0 lb

## 2016-12-28 DIAGNOSIS — J9620 Acute and chronic respiratory failure, unspecified whether with hypoxia or hypercapnia: Secondary | ICD-10-CM

## 2016-12-28 DIAGNOSIS — G4733 Obstructive sleep apnea (adult) (pediatric): Secondary | ICD-10-CM | POA: Diagnosis not present

## 2016-12-28 DIAGNOSIS — J9611 Chronic respiratory failure with hypoxia: Secondary | ICD-10-CM | POA: Diagnosis not present

## 2016-12-28 DIAGNOSIS — J90 Pleural effusion, not elsewhere classified: Secondary | ICD-10-CM | POA: Diagnosis not present

## 2016-12-28 NOTE — Progress Notes (Signed)
[facility] Consultation      Date: 12/28/2016,   MRN# [id number] NEVELYN MELLOTT [birth year] Code Status:   Questions for Most Recent Historical Code Status (Order [id number])    Question Answer Comment   In the event of cardiac or respiratory ARREST Do not call a "code blue"    In the event of cardiac or respiratory ARREST Do not perform Intubation, CPR, defibrillation or ACLS    In the event of cardiac or respiratory ARREST Use medication by any route, position, wound care, and other measures to relive pain and suffering. May use oxygen, suction and manual treatment of airway obstruction as needed for comfort.      Hosp day:'@LENGTHOFSTAYDAYS'$ @ Referring MD: '@ATDPROV'$ @     PCP:      Admission                  Current  SIBEL KHURANA is a 83 y.o. old female seen for for chronic resp failure      CHIEF COMPLAINT:   Acute on Chronic resp failure   HISTORY OF PRESENT ILLNESS  83 yo female with a PMH of Type II diabetes, anemia, GERD, cushing's disease, asthma, CKD stage III, pneumonia, edema, paroxysmal atrial fibrillation, HTN, lung cancer s/p radiation 2015, CAD, NSTEMI, HTN, diverticulitis, and gout.   The patient is a patient of Dr. Mortimer Fries, she has a history of congestive heart failure and COPD, she has had multiple admissions to the hospital, including ICU admission. She also has a known history of obstructive sleep apnea. She only takes [place] and Spiriva. She notes that over the past week her breathing has gotten worse, she is winded just sitting, when walking she gets very short winded.   Her lasix was cut down to 20 mg daily about a week, where she was on 40 and 20 on alternating days. She went out yesterday and had marinated tenderloin, the day before she had ribs.   She was started on PAP but is not using it.   Doreatha Massed and Kendra.Razor        Current Medication:  Current Outpatient Prescriptions:  .  allopurinol (ZYLOPRIM) 100 MG tablet, Take 100  mg by mouth daily., Disp: , Rfl:  .  ALPRAZolam (XANAX) 0.25 MG tablet, Take 1 tablet (0.25 mg total) by mouth at bedtime as needed for anxiety. (Patient taking differently: Take 0.5 mg by mouth at bedtime as needed for anxiety. ), Disp: 7 tablet, Rfl: 0 .  apixaban (ELIQUIS) 2.5 MG TABS tablet, Take 1 tablet (2.5 mg total) by mouth every 12 (twelve) hours., Disp: 180 tablet, Rfl: 3 .  atorvastatin (LIPITOR) 20 MG tablet, Take 1 tablet (20 mg total) by mouth every evening., Disp: 90 tablet, Rfl: 3 .  cholecalciferol (VITAMIN D) 400 UNITS TABS tablet, Take 400 Units by mouth every evening. , Disp: , Rfl:  .  cyanocobalamin 500 MCG tablet, Take 500 mcg by mouth every evening., Disp: , Rfl:  .  fenofibrate (TRICOR) 48 MG tablet, Take 48 mg by mouth every evening. , Disp: , Rfl:  .  ferrous sulfate 325 (65 FE) MG EC tablet, Take 325 mg by mouth every evening. , Disp: , Rfl:  .  fluticasone furoate-vilanterol (BREO ELLIPTA) 100-25 MCG/INH AEPB, Inhale 1 puff into the lungs daily., Disp: , Rfl:  .  furosemide (LASIX) 40 MG tablet, Take 1 tablet (40 mg total) by mouth daily. (Patient taking differently: Take 20 mg by mouth daily. ), Disp: 30  tablet, Rfl: 6 .  HYDROcodone-acetaminophen (NORCO) 5-325 MG tablet, Take 1 tablet by mouth every 6 (six) hours as needed for moderate pain., Disp: 20 tablet, Rfl: 0 .  metoprolol succinate (TOPROL XL) 25 MG 24 hr tablet, Take 0.5 tablets (12.5 mg total) by mouth daily., Disp: , Rfl:  .  Multiple Vitamins-Minerals (PRESERVISION AREDS 2) CAPS, Take 1 capsule by mouth daily., Disp: , Rfl:  .  nitroGLYCERIN (NITROSTAT) 0.4 MG SL tablet, Place 1 tablet (0.4 mg total) under the tongue every 5 (five) minutes as needed for chest pain., Disp: 25 tablet, Rfl: 3 .  pantoprazole (PROTONIX) 40 MG tablet, Take 40 mg by mouth every evening. , Disp: , Rfl:  .  predniSONE (DELTASONE) 10 MG tablet, Take 1 tablet (10 mg total) by mouth daily. 6,5,4,3,2,1 six day taper, Disp: 21 tablet,  Rfl: 0 .  sertraline (ZOLOFT) 50 MG tablet, Take 1 tablet (50 mg total) by mouth daily., Disp: 30 tablet, Rfl: 2 .  tiotropium (SPIRIVA) 18 MCG inhalation capsule, Place 18 mcg into inhaler and inhale daily., Disp: , Rfl:     ALLERGIES   Ciprofloxacin; Doxycycline; Penicillins; Sulfa antibiotics; Latex; Morphine and related; Albuterol sulfate; and Cefuroxime     REVIEW OF SYSTEMS   Review of Systems  Constitutional: Negative for chills, diaphoresis, fever, malaise/fatigue and weight loss.  HENT: Negative for congestion.   Eyes: Negative for blurred vision and double vision.  Respiratory: Positive for shortness of breath. Negative for cough, hemoptysis, sputum production and wheezing.   Cardiovascular: Positive for leg swelling. Negative for chest pain, palpitations and orthopnea.  Gastrointestinal: Negative for abdominal pain, heartburn, nausea and vomiting.  Neurological: Negative for weakness.  All other systems reviewed and are negative.    VS: There were no vitals taken for this visit.     PHYSICAL EXAM  Physical Exam  Constitutional: She is oriented to person, place, and time. She appears well-developed and well-nourished. No distress.  HENT:  Mouth/Throat: No oropharyngeal exudate.  Eyes: No scleral icterus.  Cardiovascular: Normal rate, regular rhythm and normal heart sounds.   No murmur heard. Pulmonary/Chest: No stridor. She is in respiratory distress. She has no wheezes. She has rales.  Musculoskeletal: Normal range of motion. She exhibits edema.  Neurological: She is alert and oriented to person, place, and time. No cranial nerve deficit.  Skin: Skin is warm. She is not diaphoretic.  Psychiatric: She has a normal mood and affect.       ASSESSMENT/PLAN   83 yo white female with chronic resp failure from COPD/CHF with very poor resp status. AT this time, patient needs noninvasive ventilation for survival. She has chronic hypoxia and chronic hyercapnic resp  failure, I have convinced patient that she needs noninvasive ventilation. Today she also has acute pulmonary edema.  Acute pulmonary edema, asked to increase lasix to 40 mg daily for next 3 days, then go back to 20 daily. She needs to do a better job of reducing her salt intake.   Sleep Study Confirms OSA with  AHI 15  1.continue oxygen  2.continue breo and spiriva 3. Reiterated AUTOCPAP titration with mask desensitization referral   Prognosis is very poor, resp status is very poor. High risk for recurrent admissions   The Patient requires high complexity decision making for assessment and support, frequent evaluation and titration of therapies, application of advanced monitoring technologies and extensive interpretation of multiple databases.   Patient/Family are satisfied with Plan of action and management. All questions answered  Marda Stalker, M.D. [facility]

## 2016-12-28 NOTE — Patient Instructions (Signed)
--  You need to cut down on your salt intake.  --Take lasix 40 mg daily for the next 3 days, then cut down to 20 mg daily. Increase to 40 when your breathing is worse.   --CXR 2 view today.

## 2016-12-28 NOTE — Addendum Note (Signed)
Addended by: Renelda Mom on: 12/28/2016 09:39 AM   Modules accepted: Orders

## 2017-01-08 ENCOUNTER — Ambulatory Visit: Payer: Medicare Other

## 2017-01-08 ENCOUNTER — Other Ambulatory Visit: Payer: Medicare Other

## 2017-01-11 ENCOUNTER — Encounter: Payer: Self-pay | Admitting: Internal Medicine

## 2017-01-11 ENCOUNTER — Telehealth: Payer: Self-pay | Admitting: Internal Medicine

## 2017-01-11 NOTE — Telephone Encounter (Signed)
Patient's son instructed on how to send remote transmission. Remote transmission reviewed. Presenting rhythm: BP. No episodes recorded. 100% Vp. 91.3% Effective CRTp. Stable device function. Stable thoracic impedance.  Son mentioned that patient thought she had a heart attack. Son said that she's never mentioned CP before. H/o CAD. I informed son that device will not show any signs of MI. Son verbalized understanding.  Information sent to triage for further evaluation.

## 2017-01-11 NOTE — Telephone Encounter (Signed)
Received incoming call from [facility]. Pt's son, Regina Burke (on [place]) stated pt had CP yesterday, which lasted part of the day. Pt denies CP or SOB today. Asked to speak with pt. Pt stated pain was like that of indigestion, but pt stated she felt it was not indigestion. I asked her if it was sharp or dull, pt stated is was "just a bad pain in chest and neck". Pt denied any other symptoms. Pt did not take nitroglycerin for the pain. Pt staed she took her night meds, which included Xanax, and this relived the pain. Forwarded information to Regina Burke to advise.   Regina Burke advised pt to contact primary cardiologist, Regina Burke, for recommendation.   Called pt's son with above recommendation. Son stated he will call Regina Burke office. Informed pt should report to the emergency department if pt has unresolved CP, dizziness, or syncope. He verbalized understanding and thanked me for calling.

## 2017-01-11 NOTE — Telephone Encounter (Signed)
Patient received remote device equipment today and needs assistance connecting set up please call son troy

## 2017-01-12 ENCOUNTER — Observation Stay
Admission: EM | Admit: 2017-01-12 | Discharge: 2017-01-13 | Disposition: A | Discharge status: Home / Self Care | Payer: Medicare Other | Attending: Internal Medicine | Admitting: Internal Medicine

## 2017-01-12 ENCOUNTER — Emergency Department: Payer: Medicare Other

## 2017-01-12 ENCOUNTER — Encounter: Payer: Self-pay | Admitting: Emergency Medicine

## 2017-01-12 DIAGNOSIS — Z85118 Personal history of other malignant neoplasm of bronchus and lung: Secondary | ICD-10-CM | POA: Diagnosis not present

## 2017-01-12 DIAGNOSIS — Z7901 Long term (current) use of anticoagulants: Secondary | ICD-10-CM | POA: Diagnosis not present

## 2017-01-12 DIAGNOSIS — Z923 Personal history of irradiation: Secondary | ICD-10-CM | POA: Diagnosis not present

## 2017-01-12 DIAGNOSIS — Z91040 Latex allergy status: Secondary | ICD-10-CM | POA: Insufficient documentation

## 2017-01-12 DIAGNOSIS — I340 Nonrheumatic mitral (valve) insufficiency: Secondary | ICD-10-CM | POA: Diagnosis not present

## 2017-01-12 DIAGNOSIS — K219 Gastro-esophageal reflux disease without esophagitis: Secondary | ICD-10-CM | POA: Diagnosis not present

## 2017-01-12 DIAGNOSIS — E785 Hyperlipidemia, unspecified: Secondary | ICD-10-CM | POA: Insufficient documentation

## 2017-01-12 DIAGNOSIS — Z79899 Other long term (current) drug therapy: Secondary | ICD-10-CM | POA: Insufficient documentation

## 2017-01-12 DIAGNOSIS — Z950 Presence of cardiac pacemaker: Secondary | ICD-10-CM | POA: Diagnosis not present

## 2017-01-12 DIAGNOSIS — J449 Chronic obstructive pulmonary disease, unspecified: Secondary | ICD-10-CM | POA: Diagnosis not present

## 2017-01-12 DIAGNOSIS — Z66 Do not resuscitate: Secondary | ICD-10-CM | POA: Insufficient documentation

## 2017-01-12 DIAGNOSIS — I252 Old myocardial infarction: Secondary | ICD-10-CM | POA: Diagnosis not present

## 2017-01-12 DIAGNOSIS — I2510 Atherosclerotic heart disease of native coronary artery without angina pectoris: Secondary | ICD-10-CM | POA: Diagnosis not present

## 2017-01-12 DIAGNOSIS — Z885 Allergy status to narcotic agent status: Secondary | ICD-10-CM | POA: Diagnosis not present

## 2017-01-12 DIAGNOSIS — N184 Chronic kidney disease, stage 4 (severe): Secondary | ICD-10-CM | POA: Diagnosis not present

## 2017-01-12 DIAGNOSIS — E1122 Type 2 diabetes mellitus with diabetic chronic kidney disease: Secondary | ICD-10-CM | POA: Insufficient documentation

## 2017-01-12 DIAGNOSIS — M1990 Unspecified osteoarthritis, unspecified site: Secondary | ICD-10-CM | POA: Insufficient documentation

## 2017-01-12 DIAGNOSIS — Z95810 Presence of automatic (implantable) cardiac defibrillator: Secondary | ICD-10-CM | POA: Insufficient documentation

## 2017-01-12 DIAGNOSIS — Z9851 Tubal ligation status: Secondary | ICD-10-CM | POA: Insufficient documentation

## 2017-01-12 DIAGNOSIS — R079 Chest pain, unspecified: Secondary | ICD-10-CM | POA: Diagnosis present

## 2017-01-12 DIAGNOSIS — Z955 Presence of coronary angioplasty implant and graft: Secondary | ICD-10-CM | POA: Insufficient documentation

## 2017-01-12 DIAGNOSIS — I5042 Chronic combined systolic (congestive) and diastolic (congestive) heart failure: Secondary | ICD-10-CM | POA: Diagnosis not present

## 2017-01-12 DIAGNOSIS — F419 Anxiety disorder, unspecified: Secondary | ICD-10-CM | POA: Insufficient documentation

## 2017-01-12 DIAGNOSIS — I482 Chronic atrial fibrillation: Secondary | ICD-10-CM | POA: Diagnosis not present

## 2017-01-12 DIAGNOSIS — Z87891 Personal history of nicotine dependence: Secondary | ICD-10-CM | POA: Insufficient documentation

## 2017-01-12 DIAGNOSIS — I481 Persistent atrial fibrillation: Secondary | ICD-10-CM | POA: Diagnosis not present

## 2017-01-12 DIAGNOSIS — R0781 Pleurodynia: Principal | ICD-10-CM | POA: Insufficient documentation

## 2017-01-12 DIAGNOSIS — F329 Major depressive disorder, single episode, unspecified: Secondary | ICD-10-CM | POA: Diagnosis not present

## 2017-01-12 DIAGNOSIS — Z882 Allergy status to sulfonamides status: Secondary | ICD-10-CM | POA: Insufficient documentation

## 2017-01-12 DIAGNOSIS — I420 Dilated cardiomyopathy: Secondary | ICD-10-CM | POA: Insufficient documentation

## 2017-01-12 DIAGNOSIS — J9610 Chronic respiratory failure, unspecified whether with hypoxia or hypercapnia: Secondary | ICD-10-CM | POA: Insufficient documentation

## 2017-01-12 DIAGNOSIS — G4733 Obstructive sleep apnea (adult) (pediatric): Secondary | ICD-10-CM | POA: Insufficient documentation

## 2017-01-12 DIAGNOSIS — I130 Hypertensive heart and chronic kidney disease with heart failure and stage 1 through stage 4 chronic kidney disease, or unspecified chronic kidney disease: Secondary | ICD-10-CM | POA: Insufficient documentation

## 2017-01-12 DIAGNOSIS — Z880 Allergy status to penicillin: Secondary | ICD-10-CM | POA: Insufficient documentation

## 2017-01-12 DIAGNOSIS — Z881 Allergy status to other antibiotic agents status: Secondary | ICD-10-CM | POA: Insufficient documentation

## 2017-01-12 HISTORY — DX: Cardiomyopathy, unspecified: I42.9

## 2017-01-12 HISTORY — DX: Chronic kidney disease, stage 4 (severe): N18.4

## 2017-01-12 HISTORY — DX: Nonrheumatic mitral (valve) insufficiency: I34.0

## 2017-01-12 LAB — TROPONIN I
Troponin I: <0.03 ng/mL (ref <0.03)
Troponin I: <0.03 ng/mL (ref <0.03)
Troponin I: <0.03 ng/mL (ref <0.03)
Troponin I: <0.03 ng/mL (ref <0.03)

## 2017-01-12 LAB — CBC
HEMATOCRIT: 28.1 % — AB (ref 35.0–47.0)
Hemoglobin: 9.1 g/dL — ABNORMAL LOW (ref 12.0–16.0)
MCH: 26.3 pg (ref 26.0–34.0)
MCHC: 32.6 g/dL (ref 32.0–36.0)
MCV: 80.9 fL (ref 80.0–100.0)
Platelets: 213 10*3/uL (ref 150–440)
RBC: 3.47 MIL/uL — AB (ref 3.80–5.20)
RDW: 17.3 % — ABNORMAL HIGH (ref 11.5–14.5)
WBC: 11.7 10*3/uL — AB (ref 3.6–11.0)

## 2017-01-12 LAB — BASIC METABOLIC PANEL
Anion gap: 11 (ref 5–15)
BUN: 50 mg/dL — AB (ref 6–20)
CHLORIDE: 97 mmol/L — AB (ref 101–111)
CO2: 27 mmol/L (ref 22–32)
Calcium: 8.8 mg/dL — ABNORMAL LOW (ref 8.9–10.3)
Creatinine, Ser: 1.95 mg/dL — ABNORMAL HIGH (ref 0.44–1.00)
GFR calc non Af Amer: 23 mL/min — ABNORMAL LOW (ref >60)
GFR, EST AFRICAN AMERICAN: 27 mL/min — AB (ref >60)
Glucose, Bld: 162 mg/dL — ABNORMAL HIGH (ref 65–99)
Potassium: 4.8 mmol/L (ref 3.5–5.1)
SODIUM: 135 mmol/L (ref 135–145)

## 2017-01-12 LAB — GLUCOSE, CAPILLARY: Glucose-Capillary: 164 mg/dL — ABNORMAL HIGH (ref 65–99)

## 2017-01-12 LAB — BRAIN NATRIURETIC PEPTIDE: B NATRIURETIC PEPTIDE 5: 292 pg/mL — AB (ref 0.0–100.0)

## 2017-01-12 MED ORDER — FUROSEMIDE 40 MG PO TABS
40.0000 mg | ORAL_TABLET | Freq: Every day | ORAL | Status: DC
  Administered 2017-01-12 – 2017-01-13 (×2): 40 mg via ORAL
  Filled 2017-01-12 (×2): qty 1

## 2017-01-12 MED ORDER — NITROGLYCERIN 2 % TD OINT
0.5000 [in_us] | TOPICAL_OINTMENT | Freq: Four times a day (QID) | TRANSDERMAL | Status: DC
  Administered 2017-01-12: 0.5 [in_us] via TOPICAL
  Filled 2017-01-12 (×2): qty 1

## 2017-01-12 MED ORDER — FLUTICASONE FUROATE-VILANTEROL 100-25 MCG/INH IN AEPB
1.0000 | INHALATION_SPRAY | Freq: Every day | RESPIRATORY_TRACT | Status: DC
  Administered 2017-01-12 – 2017-01-13 (×2): 1 via RESPIRATORY_TRACT
  Filled 2017-01-12: qty 28

## 2017-01-12 MED ORDER — DOCUSATE SODIUM 100 MG PO CAPS
100.0000 mg | ORAL_CAPSULE | Freq: Two times a day (BID) | ORAL | Status: DC
  Filled 2017-01-12 (×2): qty 1

## 2017-01-12 MED ORDER — ACETAMINOPHEN 325 MG PO TABS: 650.0000 mg | ORAL_TABLET | Freq: Four times a day (QID) | ORAL | Status: DC | PRN

## 2017-01-12 MED ORDER — ONDANSETRON HCL 4 MG/2ML IJ SOLN: 4.0000 mg | Freq: Four times a day (QID) | INTRAMUSCULAR | Status: DC | PRN

## 2017-01-12 MED ORDER — VITAMIN B-12 1000 MCG PO TABS
500.0000 ug | ORAL_TABLET | Freq: Every evening | ORAL | Status: DC
  Administered 2017-01-12: 500 ug via ORAL
  Filled 2017-01-12: qty 1

## 2017-01-12 MED ORDER — METOPROLOL SUCCINATE ER 25 MG PO TB24
12.5000 mg | ORAL_TABLET | Freq: Every day | ORAL | Status: DC
  Administered 2017-01-12 – 2017-01-13 (×2): 12.5 mg via ORAL
  Filled 2017-01-12 (×4): qty 1

## 2017-01-12 MED ORDER — ASPIRIN EC 81 MG PO TBEC
81.0000 mg | DELAYED_RELEASE_TABLET | Freq: Every day | ORAL | Status: DC
  Administered 2017-01-12 – 2017-01-13 (×2): 81 mg via ORAL
  Filled 2017-01-12 (×2): qty 1

## 2017-01-12 MED ORDER — TRAZODONE HCL 50 MG PO TABS: 25.0000 mg | ORAL_TABLET | Freq: Every evening | ORAL | Status: DC | PRN

## 2017-01-12 MED ORDER — APIXABAN 2.5 MG PO TABS
2.5000 mg | ORAL_TABLET | Freq: Two times a day (BID) | ORAL | Status: DC
  Administered 2017-01-12 – 2017-01-13 (×3): 2.5 mg via ORAL
  Filled 2017-01-12 (×3): qty 1

## 2017-01-12 MED ORDER — KETOROLAC TROMETHAMINE 15 MG/ML IJ SOLN
15.0000 mg | Freq: Once | INTRAMUSCULAR | Status: AC
  Administered 2017-01-12: 15 mg via INTRAVENOUS
  Filled 2017-01-12: qty 1

## 2017-01-12 MED ORDER — HYDROCODONE-ACETAMINOPHEN 5-325 MG PO TABS: 1.0000 | ORAL_TABLET | Freq: Four times a day (QID) | ORAL | Status: DC | PRN

## 2017-01-12 MED ORDER — FERROUS SULFATE 325 (65 FE) MG PO TABS
325.0000 mg | ORAL_TABLET | Freq: Every evening | ORAL | Status: DC
  Administered 2017-01-12: 325 mg via ORAL
  Filled 2017-01-12: qty 1

## 2017-01-12 MED ORDER — NITROGLYCERIN 0.4 MG SL SUBL: 0.4000 mg | SUBLINGUAL_TABLET | SUBLINGUAL | Status: DC | PRN

## 2017-01-12 MED ORDER — ATORVASTATIN CALCIUM 20 MG PO TABS
20.0000 mg | ORAL_TABLET | Freq: Every evening | ORAL | Status: DC
  Administered 2017-01-12: 20 mg via ORAL
  Filled 2017-01-12: qty 1

## 2017-01-12 MED ORDER — ONDANSETRON HCL 4 MG PO TABS: 4.0000 mg | ORAL_TABLET | Freq: Four times a day (QID) | ORAL | Status: DC | PRN

## 2017-01-12 MED ORDER — ACETAMINOPHEN 650 MG RE SUPP: 650.0000 mg | Freq: Four times a day (QID) | RECTAL | Status: DC | PRN

## 2017-01-12 MED ORDER — ASPIRIN EC 81 MG PO TBEC
162.0000 mg | DELAYED_RELEASE_TABLET | Freq: Once | ORAL | Status: AC
  Administered 2017-01-12: 162 mg via ORAL
  Filled 2017-01-12: qty 2

## 2017-01-12 MED ORDER — SERTRALINE HCL 50 MG PO TABS
50.0000 mg | ORAL_TABLET | Freq: Every day | ORAL | Status: DC
  Administered 2017-01-12 – 2017-01-13 (×2): 50 mg via ORAL
  Filled 2017-01-12 (×2): qty 1

## 2017-01-12 MED ORDER — OCUVITE-LUTEIN PO CAPS
1.0000 | ORAL_CAPSULE | Freq: Every day | ORAL | Status: DC
  Administered 2017-01-12 – 2017-01-13 (×2): 1 via ORAL
  Filled 2017-01-12 (×2): qty 1

## 2017-01-12 MED ORDER — BISACODYL 5 MG PO TBEC: 5.0000 mg | DELAYED_RELEASE_TABLET | Freq: Every day | ORAL | Status: DC | PRN

## 2017-01-12 MED ORDER — TIOTROPIUM BROMIDE MONOHYDRATE 18 MCG IN CAPS
18.0000 ug | ORAL_CAPSULE | Freq: Every day | RESPIRATORY_TRACT | Status: DC
  Administered 2017-01-12 – 2017-01-13 (×2): 18 ug via RESPIRATORY_TRACT
  Filled 2017-01-12: qty 5

## 2017-01-12 MED ORDER — ALPRAZOLAM 0.5 MG PO TABS
0.5000 mg | ORAL_TABLET | Freq: Every day | ORAL | Status: DC
  Administered 2017-01-12: 0.5 mg via ORAL
  Filled 2017-01-12: qty 1

## 2017-01-12 MED ORDER — FENOFIBRATE 54 MG PO TABS
54.0000 mg | ORAL_TABLET | Freq: Every day | ORAL | Status: DC
  Administered 2017-01-12 – 2017-01-13 (×2): 54 mg via ORAL
  Filled 2017-01-12 (×2): qty 1

## 2017-01-12 MED ORDER — PANTOPRAZOLE SODIUM 40 MG PO TBEC
40.0000 mg | DELAYED_RELEASE_TABLET | Freq: Every evening | ORAL | Status: DC
  Administered 2017-01-12: 40 mg via ORAL
  Filled 2017-01-12: qty 1

## 2017-01-12 NOTE — ED Triage Notes (Signed)
Pt to ED via POV c/o chest pain that started yesterday. Pt states that the pain is constant, dull, ache. Pain radiates into her neck. Pt denies shortness of breath that is more than her normal. Pt states that she does have nausea but no vomiting. Pt in NAD at this time.

## 2017-01-12 NOTE — Consult Note (Signed)
Cardiology Consult    Patient ID: Regina Burke MRN: [id number], DOB/AGE: 03/01/1933   Admit date: 01/12/2017 Date of Consult: 01/12/2017  Primary Physician: Tracie Harrier, MD Primary Cardiologist: Johnny Bridge, MD  Requesting Provider: Governor Specking, MD  Patient Profile    Regina Burke is a 83 y.o. female with a history of CAD s/p prior NSTEMI and PCI/DES of the OM3 (11/2014), chronic combined CHF (EF 30-35% 03/2016), CKD IV, COPD, HTN, HL, DMII, persistent AFib s/p AVN RFCA & BiV PPM placement, COPD, IDA due to chronic blood loss, OSA, and gout, who is being seen today for the evaluation of chest pain at the request of Dr. Governor Specking.  Past Medical History   Past Medical History:  Diagnosis Date  . Acute on chronic combined systolic and diastolic CHF (congestive heart failure) ([place])    a. 10/2015 Echo: EF 55-65%, Gr1 DD, mild MR, mildly dil LA, nl RV fxn, nl PASP;  b. 03/2016 Echo: EF 30-35%, mod MR, mod dil LA.  Marland Kitchen Anemia   . Arthritis   . Asthma   . Cardiac resynchronization therapy pacemaker (CRT-P) in place    a. 03/31/16:  Medtronic Percepta Quad CRT-P MRI SureScan (serial Number [id number]) device.  . CKD (chronic kidney disease), stage IV ([place])   . COPD (chronic obstructive pulmonary disease) ([place])   . Coronary artery disease    a. 11/2014 NSTEMI/PCI: LM nl, LAD 73m D1 30, LCX mild dzs, OM1 20p, OM2 225mOM3 90p (2.25x8 Promus Premier DES), RCA nl.   . Cough    CHRONIC AT NIGHT  . Cushing's disease ([facility]  . Depression   . Diverticulitis   . Edema    FEET/LEGS  . GERD (gastroesophageal reflux disease)   . Gout   . Gout   . History of hiatal hernia   . History of pneumonia   . HLD (hyperlipidemia)   . HOH (hard of hearing)   . Hypertension   . Hypertensive heart disease   . Lung cancer ([facility] 2014   S/P radiation 2015  . Migraine   . Mixed Ischemic and Nonischemic Cardiomyopathy ([place]   a. 03/2016 Echo: EF 30-35%.  . Moderate mitral regurgitation   .  Multiple allergies   . Myocardial infarction ([facility]  . Oxygen deficiency    2LITERS  . Persistent atrial fibrillation ([facility])    a. on Eliquis 2.5 mg bid (age & SCr); b. CHADS2VASc ==> 7 (CHF, HTN, age x 2, DM, vascular disease and sex category);  c. 03/2016 s/p AVN and MDT BiV ICD placement.  . Presence of permanent cardiac pacemaker   . S/P AV nodal ablation    a. on 03/31/16 for persistant afib with CRT-P placement  . Sleep apnea   . Type II diabetes mellitus ([facility]    Past Surgical History:  Procedure Laterality Date  . ABDOMINAL HYSTERECTOMY    . ABLATION     July 2017  . ADRENALECTOMY Left 1980's   "[place]"  . APPENDECTOMY    . BREAST CYST EXCISION Left   . CATARACT EXTRACTION W/PHACO Right 12/28/2015   Procedure: CATARACT EXTRACTION PHACO AND INTRAOCULAR LENS PLACEMENT (IOC);  Surgeon: WiBirder RobsonMD;  Location: [facility] ORS;  Service: Ophthalmology;  Laterality: Right;  [place]4   . CATARACT EXTRACTION W/PHACO Left 11/14/2016   Procedure: CATARACT EXTRACTION PHACO AND INTRAOCULAR LENS PLACEMENT (IOC);  Surgeon: WiBirder RobsonMD;  Location: [facility] ORS;  Service: Ophthalmology;  Laterality: Left;  [place] 59.8 AP% 18.1 CDE 10.78 Fluid pack lot # [id number]  . CHOLECYSTECTOMY    . CORONARY ANGIOPLASTY WITH STENT PLACEMENT  12/23/2014  . ELECTROPHYSIOLOGIC STUDY N/A 03/08/2016   Procedure: CARDIOVERSION;  Surgeon: Wende Bushy, MD;  Location: [facility] ORS;  Service: Cardiovascular;  Laterality: N/A;  . ELECTROPHYSIOLOGIC STUDY N/A 03/07/2016   Procedure: Cardioversion;  Surgeon: Wende Bushy, MD;  Location: [facility] OR[facility];  Service: Cardiovascular;  Laterality: N/A;  . ELECTROPHYSIOLOGIC STUDY N/A 03/31/2016   Procedure: AV Node Ablation;  Surgeon: Will Meredith Leeds, MD;  Location: [facility] LAB;  Service: Cardiovascular;  Laterality: N/A;  . EP IMPLANTABLE DEVICE N/A 03/31/2016   Procedure: BiV Pacemaker Insertion CRT-P;  Surgeon: Will Meredith Leeds, MD;  Location: [place] [facility] LAB;   Service: Cardiovascular;  Laterality: N/A;  . FRACTURE SURGERY    . INSERT / REPLACE / REMOVE PACEMAKER  02/2016  . LEFT HEART CATHETERIZATION WITH CORONARY ANGIOGRAM N/A 12/23/2014   Procedure: LEFT HEART CATHETERIZATION WITH CORONARY ANGIOGRAM;  Surgeon: Burnell Blanks, MD;  Location: [facility] CATH LAB;  Service: Cardiovascular;  Laterality: N/A;  . PERCUTANEOUS CORONARY STENT INTERVENTION (PCI-S)  12/23/2014   Procedure: PERCUTANEOUS CORONARY STENT INTERVENTION (PCI-S);  Surgeon: Burnell Blanks, MD;  Location: [facility] CATH LAB;  Service: Cardiovascular;;  Promus 2.25x8  . TONSILLECTOMY    . TRANSTHORACIC ECHOCARDIOGRAM  11/26/2015   Technically difficult study. EF 55-60%. Normal wall motion. GR 1 DD.  . TUBAL LIGATION    . WRIST FRACTURE SURGERY Bilateral ~ 2000     Allergies  Allergies  Allergen Reactions  . Ciprofloxacin Shortness Of Breath, Itching and Rash  . Doxycycline Shortness Of Breath, Itching and Rash  . Penicillins Shortness Of Breath, Itching, Rash and Other (See Comments)    Has patient had a PCN reaction causing immediate rash, facial/tongue/throat swelling, SOB or lightheadedness with hypotension: Yes Has patient had a PCN reaction causing severe rash involving mucus membranes or skin necrosis: No Has patient had a PCN reaction that required hospitalization No Has patient had a PCN reaction occurring within the last 10 years: No If all of the above answers are "NO", then may proceed with Cephalosporin use.  . Sulfa Antibiotics Shortness Of Breath, Itching and Rash  . Latex Itching  . Morphine And Related Itching  . Albuterol Sulfate   . Cefuroxime Rash    Blisters in mouth    History of Present Illness    83 y.o. female with a history of CAD s/p prior NSTEMI and PCI/DES of the OM3 (11/2014), chronic combined CHF (EF 30-35% 03/2016), CKD IV, COPD, HTN, HL, DMII, persistent AFib s/p AVN RFCA & BiV PPM placement, COPD, IDA due to chronic blood loss, OSA, and gout.  She was recently seen in clinic by Dr. Rockey Situ and felt to be doing well @ the time.  She reported occasional palpitations (presumably pvc's) and was Rx toprol xl 12.5 mg daily.  She was then seen by pulmonology on 5/3.  She c/o dyspnea.  CXR reviewed and there was concern for pulm edema.  She was advised to increase lasix to 40 daily x 3 days and then drop back down to 20 daily.    Following pulmonology visit, she felt better but then beginning on the morning of 5/17, she began to have constant, 8/10 retrosternal discomfort, which she says feels like indigestion.  This has been worse with deep breathing and cough, and associated with dyspnea and nausea.  She did not sleep well last  night.  She always sleeps in a recliner but was more restless than usual.  She developed chills and was cold throughout the night.  She denies fevers.  When she got up this am, she decided to take a sl ntg.  She did note some, though not complete relief of c/p.  She presented to the [facility] ED this morning.  Here, ECG is paced.  WBC is mildly elevated @ 11.7.  She is afebrile.  Troponins are nl.  CXR w/ stable bilat effusions and mild left base atx.  She continues to report c/p - 5/10, that is worse with deep breathing and coughing.  Inpatient Medications    . ALPRAZolam  0.5 mg Oral QHS  . apixaban  2.5 mg Oral Q12H  . aspirin EC  81 mg Oral Daily  . atorvastatin  20 mg Oral QPM  . cyanocobalamin  500 mcg Oral QPM  . docusate sodium  100 mg Oral BID  . fenofibrate  54 mg Oral Daily  . ferrous sulfate  325 mg Oral QPM  . fluticasone furoate-vilanterol  1 puff Inhalation Daily  . furosemide  40 mg Oral Daily  . metoprolol succinate  12.5 mg Oral Daily  . nitroGLYCERIN  0.5 inch Topical Q6H  . pantoprazole  40 mg Oral QPM  . PRESERVISION AREDS 2  1 capsule Oral Daily  . sertraline  50 mg Oral Daily  . tiotropium  18 mcg Inhalation Daily    Family History    Family History  Problem Relation Age of Onset  . Heart  disease Mother   . Diabetes Mother   . Osteoarthritis Mother   . Hypertension Mother   . Heart disease Father   . Hypertension Father   . COPD Brother     Social History    Social History   Social History  . Marital status: Widowed    Spouse name: N/A  . Number of children: N/A  . Years of education: N/A   Occupational History  . retired    Social History Main Topics  . Smoking status: Former Smoker    Packs/day: 1.00    Years: 45.00    Types: Cigarettes    Quit date: 04/25/1994  . Smokeless tobacco: Never Used  . Alcohol use No     Comment: 12/23/2014 "might have a couple mixed drinks/year"  . Drug use: No  . Sexual activity: No   Other Topics Concern  . Not on file   Social History Narrative   Lives locally with son.  Does not routinely exercise.     Review of Systems    General:  +++ chills, she has felt warm though no documented fever, no night sweats or weight changes.  Cardiovascular:  +++ pleuritic chest pain, +++ dyspnea on exertion, no edema, orthopnea, palpitations, paroxysmal nocturnal dyspnea. Dermatological: No rash, lesions/masses Respiratory: +++ nonproductive cough - present x several months, +++ dyspnea Urologic: No hematuria, dysuria Abdominal:   +++ nausea, no vomiting, diarrhea, bright red blood per rectum, melena, or hematemesis Neurologic:  No visual changes, wkns, changes in mental status. All other systems reviewed and are otherwise negative except as noted above.  Physical Exam    Blood pressure (!) 136/47, pulse 72, temperature 98.6 F (37 C), temperature source Oral, resp. rate 16, SpO2 91 %.  General: Pleasant, NAD Psych: Flat affect. Neuro: Alert and oriented X 3. Moves all extremities spontaneously. HEENT: Normal  Neck: Supple without bruits or JVD. Lungs:  Resp regular and  unlabored, diminished breath sounds left base. Heart: RRR no s3, s4, 2/6 syst murmur throughout. Abdomen: Soft, non-tender, non-distended, BS + x 4.    Extremities: No clubbing, cyanosis or edema. DP/PT/Radials 1+ and equal bilaterally.  Labs     Recent Labs  01/12/17 0908 01/12/17 1122  TROPONINI <0.03 <0.03   Lab Results  Component Value Date   WBC 11.7 (H) 01/12/2017   HGB 9.1 (L) 01/12/2017   HCT 28.1 (L) 01/12/2017   MCV 80.9 01/12/2017   PLT 213 01/12/2017    Recent Labs Lab 01/12/17 0908  NA 135  K 4.8  CL 97*  CO2 27  BUN 50*  CREATININE 1.95*  CALCIUM 8.8*  GLUCOSE 162*   Lab Results  Component Value Date   CHOL 202 (H) 12/23/2014   HDL 28 (L) 12/23/2014   LDLCALC 107 (H) 12/23/2014   TRIG 337 (H) 12/23/2014     Radiology Studies    Dg Chest 2 View  Result Date: 01/12/2017 CLINICAL DATA:  Chest pain EXAM: CHEST  2 VIEW COMPARISON:  10-02-202018 FINDINGS: Persistent scarring is noted in the right upper lobe. Surgical clips are noted. Pacing device is again seen and stable. Cardiac shadow remains enlarged. Aortic calcifications are again noted. Chronic blunting of the costophrenic angles is seen stable from the prior exam. Some mild left basilar atelectasis is noted IMPRESSION: Stable small effusions. Mild left basilar atelectasis. Chronic changes in the right apex. Electronically Signed   By: Inez Catalina M.D.   On: 01/12/2017 09:03   Dg Chest 2 View  Result Date: 12/28/2016 CLINICAL DATA:  Shortness of breath for several days EXAM: CHEST  2 VIEW COMPARISON:  04/01/2016 FINDINGS: Cardiac shadow is mildly enlarged. Pacing device is again seen and stable. Postsurgical changes are noted overlying the right apex. Chronic scarring is noted in the right upper lobe. Improved aeration is noted in the bases bilaterally. Small pleural effusions remain. IMPRESSION: Overall improved aeration.  Small pleural effusions remain. Electronically Signed   By: Inez Catalina M.D.   On: 10-02-202018 10:29    ECG & Cardiac Imaging    V paced, 76, underlying afib/flutter  Assessment & Plan    1.  Pleuritic Chest Pain:  Pt  presented to the [facility] ED this AM with a 24 hr h/o persistent retrosternal chest pain assoc w/ dyspnea, n, and chills, that is worse with deep breathing and cough.  She took ntg this am with some, though not complete relief - currently 5/10.  Despite prolonged symptoms, troponin is nl.  Doubt ACS.  ? Early resp infxn (diminished breath sounds L base, elevated wbc, chills, pleuritic pain) vs. Viral infxn and possible pericarditis (paced - no ST elev noted, no recent sick contacts, no pulsus paradoxus).  Defer abx to IM.  Will give a dose of toradol to see if this helps.  Will check echo to r/o effusion (no rub on exam).  Due to CKD IV, would not be able to use more than one dose of colchicine and would prefer to limit nsaid exposure.  2. CAD:  Prior h/o nstemi w/ OM3 stenting in 2016.  Despite prolonged Ss, trop nl.  Cont asa, statin,  blocker.  3.  Chronic Afib:  S/p AVN and BiV pacer.  Paced.  Cont eliquis - renal dose (age/creat).  4.  Essential HTN:  Stable.  5.  HL:  Cont statin.    6.  CKD IV:  Creat stable.  Follow.  7.  Chronic combined  syst/diast chf/mixed ischemic/nonischemic CM:  EF 30-35% in 03/2016.  Euvolemic on exam.  BNP mildly elevated @ 292.  Cont  blocker and PO lasix.  No acei/arb/arni/spiro in setting of CKD IV.  Could consider low dose hydral/nitrate if pressure tolerates.  F/u echo in setting of above.  8. Normocytic anemia:  H/h stable.  Signed, Murray Hodgkins, NP 01/12/2017, 12:20 PM

## 2017-01-12 NOTE — Progress Notes (Signed)
Patient refused bed alarm. Educated on safety, usage of call bell and phone. Verbalized understanding.

## 2017-01-12 NOTE — ED Provider Notes (Signed)
[facility] Emergency Department Provider Note  ____________________________________________   None    (approximate)  I have reviewed the triage vital signs and the nursing notes.   HISTORY  Chief Complaint Chest Pain    HPI Regina Burke is a 83 y.o. female with a history of coronary artery disease with stent placement in 2016 as well as atrial fibrillation on eliquis was presenting with 1 day of chest pain. Says the pain had been constant. Says it was 9 out of 10 pain radiating to her neck that was relieved with nitroglycerin. Says was associated with shortness of breath which is now relieved at this time. Says that she is an ongoing shortness of breath over the past several weeks. She has had a Lasix adjusted.   Past Medical History:  Diagnosis Date  . Anemia   . Arthritis   . Asthma   . Cardiac resynchronization therapy pacemaker (CRT-P) in place    a. 03/31/16:  Medtronic Percepta Quad CRT-P MRI SureScan (serial Number [id number]) device.  . Chronic diastolic CHF (congestive heart failure) ([place])    a. 10/2015 Echo: EF 55-65%, Gr1 DD, mild MR, mildly dil LA, nl RV fxn, nl PASP.  Marland Kitchen Chronic kidney disease (CKD), stage III (moderate)   . COPD (chronic obstructive pulmonary disease) ([place])   . Coronary artery disease    a. 11/2014 NSTEMI/PCI: LM nl, LAD 42m D1 30, LCX mild dzs, OM1 20p, OM2 214mOM3 90p (2.25x8 Promus Premier DES), RCA nl.   . Cough    CHRONIC AT NIGHT  . Cushing's disease (HCSheridan  . Depression   . Diverticulitis   . Dyspnea   . Dysrhythmia   . Edema    FEET/LEGS  . GERD (gastroesophageal reflux disease)   . Gout   . Gout   . Heart murmur   . History of hiatal hernia   . History of pneumonia   . HLD (hyperlipidemia)   . HOH (hard of hearing)   . Hypertension   . Hypertensive heart disease   . Lung cancer (HCMicanopydx'd 2014   S/P radiation 2015  . Migraine   . Multiple allergies   . Myocardial infarction (HCNew[place]  . Oxygen  deficiency    2LITERS  . Persistent atrial fibrillation (HCC)    a. on amio, Toprol XL, Cardizem CD, and Eliquis 2.5 mg bid (age & SCr); b. CHADS2VASc ==> 7 (CHF, HTN, age x 2, DM, vascular disease and sex category)  . Presence of permanent cardiac pacemaker   . S/P AV nodal ablation    a. on 03/31/16 for persistant afib with CRT-P placement  . Sleep apnea   . Type II diabetes mellitus ([facility]    Patient Active Problem List   Diagnosis Date Noted  . Chest pain 01/12/2017  . Dizziness 08/25/2016  . LBBB (left bundle branch block) 04/01/2016  . S/P AV nodal ablation   . Cardiac resynchronization therapy pacemaker (CRT-P) in place   . Persistent atrial fibrillation (HCSierra [place] CHA2DS2-Vasc = ~7. On Xarelto 15 mg (age & renal Fxn) 03/18/2016  . Congestive dilated cardiomyopathy (HCGopher Flats07/22/2017    Class: Temporary  . DNR (do not resuscitate) discussion   . Palliative care encounter   . Persistent atrial fibrillation (HCVanderbilt  . Chronic diastolic CHF (congestive heart failure) (HCWest Hatt[place]06/21/2017  . HLD (hyperlipidemia) 02/16/2016  . Iron deficiency anemia due to chronic blood loss   . Essential hypertension   . Dyspnea 11/25/2015  .  Diaphoresis   . Renal insufficiency   . Centrilobular emphysema ([place])   . Chronic obstructive pulmonary disease ([place]) 07/14/2015  . Status post thoracentesis   . S/P thoracentesis   . H/O: lung cancer   . Pleural effusion   . Coronary artery disease involving native coronary artery of native heart without angina pectoris   . S/P coronary artery stent placement   . Pressure ulcer 06/08/2015  . Dehydration 05/09/2015  . Diabetes ([place]) 05/09/2015  . Closed fracture nasal bone 05/09/2015  . Cancer of upper lobe of right lung ([place]) 01/08/2015  . Allergic state 01/08/2015  . Anemia associated with chronic renal failure 01/08/2015  . Chronic kidney disease 01/08/2015  . CAFL (chronic airflow limitation) ([place]) 01/08/2015  . Arthritis, degenerative 01/08/2015  .  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 Diabetes mellitus, type 2 ([place]) 09/11/2014  . Type 2 diabetes mellitus ([place]) 09/11/2014    Past Surgical History:  Procedure Laterality Date  . ABDOMINAL HYSTERECTOMY    . ABLATION     July 2017  . ADRENALECTOMY Left 1980's   "[place]"  . APPENDECTOMY    . BREAST CYST EXCISION Left   . CATARACT EXTRACTION W/PHACO Right 12/28/2015   Procedure: CATARACT EXTRACTION PHACO AND INTRAOCULAR LENS PLACEMENT (IOC);  Surgeon: Birder Robson, MD;  Location: [facility] ORS;  Service: Ophthalmology;  Laterality: Right;  [place] 48.4   . CATARACT EXTRACTION W/PHACO Left 11/14/2016   Procedure: CATARACT EXTRACTION PHACO AND INTRAOCULAR LENS PLACEMENT (IOC);  Surgeon: Birder Robson, MD;  Location: [facility] ORS;  Service: Ophthalmology;  Laterality: Left;  [place] 59.8 AP% 18.1 CDE 10.78 Fluid pack lot # [id number]  . CHOLECYSTECTOMY    . CORONARY ANGIOPLASTY WITH STENT PLACEMENT  12/23/2014  . ELECTROPHYSIOLOGIC STUDY N/A 03/08/2016   Procedure: CARDIOVERSION;  Surgeon: Wende Bushy, MD;  Location: [facility] ORS;  Service: Cardiovascular;  Laterality: N/A;  . ELECTROPHYSIOLOGIC STUDY N/A 03/07/2016   Procedure: Cardioversion;  Surgeon: Wende Bushy, MD;  Location: [facility] ORS;  Service: Cardiovascular;  Laterality: N/A;  . ELECTROPHYSIOLOGIC STUDY N/A 03/31/2016   Procedure: AV Node Ablation;  Surgeon: Will Meredith Leeds, MD;  Location: [place] CV LAB;  Service: Cardiovascular;  Laterality: N/A;  . EP IMPLANTABLE DEVICE N/A 03/31/2016   Procedure: BiV Pacemaker Insertion CRT-P;  Surgeon: Will Meredith Leeds, MD;  Location: [place] [facility] LAB;  Service: Cardiovascular;  Laterality: N/A;  . FRACTURE SURGERY    . INSERT / REPLACE / REMOVE PACEMAKER  02/2016  . LEFT HEART CATHETERIZATION WITH CORONARY ANGIOGRAM N/A 12/23/2014   Procedure: LEFT HEART CATHETERIZATION WITH CORONARY ANGIOGRAM;  Surgeon: Burnell Blanks, MD;  Location: [facility] CATH LAB;  Service: Cardiovascular;  Laterality:  N/A;  . PERCUTANEOUS CORONARY STENT INTERVENTION (PCI-S)  12/23/2014   Procedure: PERCUTANEOUS CORONARY STENT INTERVENTION (PCI-S);  Surgeon: Burnell Blanks, MD;  Location: [facility] CATH LAB;  Service: Cardiovascular;;  Promus 2.25x8  . TONSILLECTOMY    . TRANSTHORACIC ECHOCARDIOGRAM  11/26/2015   Technically difficult study. EF 55-60%. Normal wall motion. GR 1 DD.  . TUBAL LIGATION    . WRIST FRACTURE SURGERY Bilateral ~ 2000    Prior to Admission medications   Medication Sig Start Date End Date Taking? Authorizing Provider  ALPRAZolam Duanne Moron) 0.5 MG tablet Take 0.5 mg by mouth at bedtime. 12/27/16  Yes [provider]  apixaban (ELIQUIS) 2.5 MG TABS tablet Take 1 tablet (2.5 mg total) by mouth every 12 (twelve) hours. 08/01/16   Minna Merritts, MD  atorvastatin (LIPITOR) 20 MG  tablet Take 1 tablet (20 mg total) by mouth every evening. 12/03/15   Minna Merritts, MD  cholecalciferol (VITAMIN D) 400 UNITS TABS tablet Take 400 Units by mouth every evening.     [provider]  cyanocobalamin 500 MCG tablet Take 500 mcg by mouth every evening.    [provider]  fenofibrate (TRICOR) 48 MG tablet Take 48 mg by mouth every evening.     [provider]  ferrous sulfate 325 (65 FE) MG EC tablet Take 325 mg by mouth every evening.     [provider]  fluticasone furoate-vilanterol (BREO ELLIPTA) 100-25 MCG/INH AEPB Inhale 1 puff into the lungs daily.    [provider]  furosemide (LASIX) 40 MG tablet Take 1 tablet (40 mg total) by mouth daily. Patient taking differently: Take 20 mg by mouth daily.  04/01/16   Eileen Stanford, PA-C  HYDROcodone-acetaminophen (NORCO) 5-325 MG tablet Take 1 tablet by mouth every 6 (six) hours as needed for moderate pain. 12/08/16   Duanne Guess, PA-C  metoprolol succinate (TOPROL XL) 25 MG 24 hr tablet Take 0.5 tablets (12.5 mg total) by mouth daily. 12/19/16   Minna Merritts, MD  Multiple  Vitamins-Minerals (PRESERVISION AREDS 2) CAPS Take 1 capsule by mouth daily.    [provider]  nitroGLYCERIN (NITROSTAT) 0.4 MG SL tablet Place 1 tablet (0.4 mg total) under the tongue every 5 (five) minutes as needed for chest pain. 12/24/14   Barrett, Evelene Croon, PA-C  pantoprazole (PROTONIX) 40 MG tablet Take 40 mg by mouth every evening.     [provider]  sertraline (ZOLOFT) 50 MG tablet Take 1 tablet (50 mg total) by mouth daily. 03/24/16   Gladstone Lighter, MD  tiotropium (SPIRIVA) 18 MCG inhalation capsule Place 18 mcg into inhaler and inhale daily.    [provider]    Allergies Ciprofloxacin; Doxycycline; Penicillins; Sulfa antibiotics; Latex; Morphine and related; Albuterol sulfate; and Cefuroxime  Family History  Problem Relation Age of Onset  . Heart disease Mother   . Diabetes Mother   . Osteoarthritis Mother   . Hypertension Mother   . Heart disease Father   . Hypertension Father   . COPD Brother     Social History Social History  Substance Use Topics  . Smoking status: Former Smoker    Packs/day: 1.00    Years: 45.00    Types: Cigarettes    Quit date: 04/25/1994  . Smokeless tobacco: Never Used  . Alcohol use No     Comment: 12/23/2014 "might have a couple mixed drinks/year"    Review of Systems  Constitutional: No fever/chills Eyes: No visual changes. ENT: No sore throat. Cardiovascular: as above Respiratory: as above Gastrointestinal: No abdominal pain.  No nausea, no vomiting.  No diarrhea.  No constipation. Genitourinary: Negative for dysuria. Musculoskeletal: Negative for back pain. Skin: Negative for rash. Neurological: Negative for headaches, focal weakness or numbness.   ____________________________________________   PHYSICAL EXAM:  VITAL SIGNS: ED Triage Vitals [01/12/17 0753]  Enc Vitals Group     BP (!) 136/47     Pulse Rate 72     Resp 16     Temp 98.6 F (37 C)     Temp Source Oral     SpO2 91 %      Weight      Height      Head Circumference      Peak Flow      Pain Score  8     Pain Loc      Pain Edu?      Excl. in [place]?     Constitutional: Alert and oriented. Well appearing and in no acute distress. Eyes: Conjunctivae are normal.  Head: Atraumatic. Nose: No congestion/rhinnorhea. Mouth/Throat: Mucous membranes are moist.  Neck: No stridor.   Cardiovascular: Normal rate, regular rhythm. Grossly normal heart sounds.   Respiratory: Normal respiratory effort.  No retractions. Mild rales to the bilateral bases. Gastrointestinal: Soft and nontender. No distention. No CVA tenderness. Musculoskeletal: No lower extremity tenderness nor edema.  No joint effusions. Neurologic:  Normal speech and language. No gross focal neurologic deficits are appreciated. Skin:  Skin is warm, dry and intact. No rash noted. Psychiatric: Mood and affect are normal. Speech and behavior are normal.  ____________________________________________   LABS (all labs ordered are listed, but only abnormal results are displayed)  Labs Reviewed  GLUCOSE, CAPILLARY - Abnormal; Notable for the following:       Result Value   Glucose-Capillary 164 (*)    All other components within normal limits  BASIC METABOLIC PANEL - Abnormal; Notable for the following:    Chloride 97 (*)    Glucose, Bld 162 (*)    BUN 50 (*)    Creatinine, Ser 1.95 (*)    Calcium 8.8 (*)    GFR calc non Af Amer 23 (*)    GFR calc Af Amer 27 (*)    All other components within normal limits  CBC - Abnormal; Notable for the following:    WBC 11.7 (*)    RBC 3.47 (*)    Hemoglobin 9.1 (*)    HCT 28.1 (*)    RDW 17.3 (*)    All other components within normal limits  TROPONIN I  BRAIN NATRIURETIC PEPTIDE  TROPONIN I  TROPONIN I  TROPONIN I   ____________________________________________  EKG  ED ECG REPORT I, Doran Stabler, the attending physician, personally viewed and interpreted this ECG.   Date: 01/12/2017  EKG Time:  0746  Rate: 76  Rhythm: ventricular paced rythm  Axis: normal  Intervals:none  ST&T Change: No ST segment elevation or depression. T-wave inversions in aVL, V2 that are unchanged from previous.  ____________________________________________  RADIOLOGY  Chest x-ray with stable effusions. Mild left basilar atelectasis. Chronic changes in the right apex.   ____________________________________________   PROCEDURES  Procedure(s) performed:   Procedures  Critical Care performed:   ____________________________________________   INITIAL IMPRESSION / ASSESSMENT AND PLAN / ED COURSE  Pertinent labs & imaging results that were available during my care of the patient were reviewed by me and considered in my medical decision making (see chart for details).   ----------------------------------------- 11:18 AM on 01/12/2017 -----------------------------------------  Patient reports return of some mild neck pain. We will re-dose nitroglycerin. Patient will be admitted to the hospital. Signed out to Dr. Vella Kohler.  Patient aware me permission to the hospital. His understanding and willing to comply.     ____________________________________________   FINAL CLINICAL IMPRESSION(S) / ED DIAGNOSES  Chest pain.    NEW MEDICATIONS STARTED DURING THIS VISIT:  New Prescriptions   No medications on file     Note:  This document was prepared using Dragon voice recognition software and may include unintentional dictation errors.     Orbie Pyo, MD 01/12/17 [phone]

## 2017-01-12 NOTE — H&P (Addendum)
[place] [facility] [place] NAME: Arti Trang    MR#:  [id number]  DATE OF BIRTH:  04/08/1933  DATE OF ADMISSION:  01/12/2017  PRIMARY CARE PHYSICIAN: Tracie Harrier, MD   REQUESTING/REFERRING PHYSICIAN: Dr. Dineen Kid  CHIEF COMPLAINT: Chest pain    Chief Complaint  Patient presents with  . Chest Pain    HISTORY OF PRESENT ILLNESS:  Jeannine Pennisi  is a 83 y.o. female with a known history of Essential hypertension, coronary artery disease status post PCI in 4098, chronic diastolic heart failure who follows up with [facility] cardiology comes in with left-sided chest pains started last night patient says that chest pain radiating to left side of the neck and relieved with nitroglycerin. Says the chest pain not  better since last night. No associated radiation to the left  arm. Patient does have nausea but denies any vomiting. No dizziness. Does have shortness of breath but she says that she has COPD and her shortness of breath is not worse. The first set of troponins are negative, EKG did not show any new changes. Also complains of dry cough, unable to get the phlegm, chest pain is worse with deep breaths. Patient says that she usually does not take aspirin but  Her  son gave her aspirin last night.  PAST MEDICAL HISTORY:   Past Medical History:  Diagnosis Date  . Anemia   . Arthritis   . Asthma   . Cardiac resynchronization therapy pacemaker (CRT-P) in place    a. 03/31/16:  Medtronic Percepta Quad CRT-P MRI SureScan (serial Number [id number]) device.  . Chronic diastolic CHF (congestive heart failure) ([place])    a. 10/2015 Echo: EF 55-65%, Gr1 DD, mild MR, mildly dil LA, nl RV fxn, nl PASP.  Marland Kitchen Chronic kidney disease (CKD), stage III (moderate)   . COPD (chronic obstructive pulmonary disease) ([place])   . Coronary artery disease    a. 11/2014 NSTEMI/PCI: LM nl, LAD 73m D1 30, LCX mild dzs, OM1 20p, OM2 264mOM3 90p (2.25x8 Promus Premier  DES), RCA nl.   . Cough    CHRONIC AT NIGHT  . Cushing's disease ([facility]  . Depression   . Diverticulitis   . Dyspnea   . Dysrhythmia   . Edema    FEET/LEGS  . GERD (gastroesophageal reflux disease)   . Gout   . Gout   . Heart murmur   . History of hiatal hernia   . History of pneumonia   . HLD (hyperlipidemia)   . HOH (hard of hearing)   . Hypertension   . Hypertensive heart disease   . Lung cancer ([facility] 2014   S/P radiation 2015  . Migraine   . Multiple allergies   . Myocardial infarction ([facility]  . Oxygen deficiency    2LITERS  . Persistent atrial fibrillation (HCC)    a. on amio, Toprol XL, Cardizem CD, and Eliquis 2.5 mg bid (age & SCr); b. CHADS2VASc ==> 7 (CHF, HTN, age x 2, DM, vascular disease and sex category)  . Presence of permanent cardiac pacemaker   . S/P AV nodal ablation    a. on 03/31/16 for persistant afib with CRT-P placement  . Sleep apnea   . Type II diabetes mellitus ([facility]    PAST SURGICAL HISTOIRY:   Past Surgical History:  Procedure Laterality Date  . ABDOMINAL HYSTERECTOMY    . ABLATION     July 2017  . ADRENALECTOMY  Left 1980's   "[place]"  . APPENDECTOMY    . BREAST CYST EXCISION Left   . CATARACT EXTRACTION W/PHACO Right 12/28/2015   Procedure: CATARACT EXTRACTION PHACO AND INTRAOCULAR LENS PLACEMENT (IOC);  Surgeon: Birder Robson, MD;  Location: [facility] ORS;  Service: Ophthalmology;  Laterality: Right;  [place] 48.4   . CATARACT EXTRACTION W/PHACO Left 11/14/2016   Procedure: CATARACT EXTRACTION PHACO AND INTRAOCULAR LENS PLACEMENT (IOC);  Surgeon: Birder Robson, MD;  Location: [facility] ORS;  Service: Ophthalmology;  Laterality: Left;  [place] 59.8 AP% 18.1 CDE 10.78 Fluid pack lot # [id number]  . CHOLECYSTECTOMY    . CORONARY ANGIOPLASTY WITH STENT PLACEMENT  12/23/2014  . ELECTROPHYSIOLOGIC STUDY N/A 03/08/2016   Procedure: CARDIOVERSION;  Surgeon: Wende Bushy, MD;  Location: [facility] ORS;  Service: Cardiovascular;  Laterality: N/A;  .  ELECTROPHYSIOLOGIC STUDY N/A 03/07/2016   Procedure: Cardioversion;  Surgeon: Wende Bushy, MD;  Location: [facility] ORS;  Service: Cardiovascular;  Laterality: N/A;  . ELECTROPHYSIOLOGIC STUDY N/A 03/31/2016   Procedure: AV Node Ablation;  Surgeon: Will Meredith Leeds, MD;  Location: [place] [facility];  Service: Cardiovascular;  Laterality: N/A;  . EP IMPLANTABLE DEVICE N/A 03/31/2016   Procedure: BiV Pacemaker Insertion CRT-P;  Surgeon: Will Meredith Leeds, MD;  Location: [place] [facility];  Service: Cardiovascular;  Laterality: N/A;  . FRACTURE SURGERY    . INSERT / REPLACE / REMOVE PACEMAKER  02/2016  . LEFT HEART CATHETERIZATION WITH CORONARY ANGIOGRAM N/A 12/23/2014   Procedure: LEFT HEART CATHETERIZATION WITH CORONARY ANGIOGRAM;  Surgeon: Burnell Blanks, MD;  Location: [facility] CATH LAB;  Service: Cardiovascular;  Laterality: N/A;  . PERCUTANEOUS CORONARY STENT INTERVENTION (PCI-S)  12/23/2014   Procedure: PERCUTANEOUS CORONARY STENT INTERVENTION (PCI-S);  Surgeon: Burnell Blanks, MD;  Location: [facility] CATH LAB;  Service: Cardiovascular;;  Promus 2.25x8  . TONSILLECTOMY    . TRANSTHORACIC ECHOCARDIOGRAM  11/26/2015   Technically difficult study. EF 55-60%. Normal wall motion. GR 1 DD.  . TUBAL LIGATION    . WRIST FRACTURE SURGERY Bilateral ~ 2000    SOCIAL HISTORY:   Social History  Substance Use Topics  . Smoking status: Former Smoker    Packs/day: 1.00    Years: 45.00    Types: Cigarettes    Quit date: 04/25/1994  . Smokeless tobacco: Never Used  . Alcohol use No     Comment: 12/23/2014 "might have a couple mixed drinks/year"    FAMILY HISTORY:   Family History  Problem Relation Age of Onset  . Heart disease Mother   . Diabetes Mother   . Osteoarthritis Mother   . Hypertension Mother   . Heart disease Father   . Hypertension Father   . COPD Brother     DRUG ALLERGIES:   Allergies  Allergen Reactions  . Ciprofloxacin Shortness Of Breath, Itching and Rash  .  Doxycycline Shortness Of Breath, Itching and Rash  . Penicillins Shortness Of Breath, Itching, Rash and Other (See Comments)    Has patient had a PCN reaction causing immediate rash, facial/tongue/throat swelling, SOB or lightheadedness with hypotension: Yes Has patient had a PCN reaction causing severe rash involving mucus membranes or skin necrosis: No Has patient had a PCN reaction that required hospitalization No Has patient had a PCN reaction occurring within the last 10 years: No If all of the above answers are "NO", then may proceed with Cephalosporin use.  . Sulfa Antibiotics Shortness Of Breath, Itching and Rash  . Latex Itching  . Morphine And Related Itching  .  Albuterol Sulfate   . Cefuroxime Rash    Blisters in mouth    REVIEW OF SYSTEMS:  CONSTITUTIONAL: No fever, fatigue or weakness.  EYES: No blurred or double vision.  EARS, NOSE, AND THROAT: No tinnitus or ear pain.  RESPIRATORY: dry cough, shortness of breath, on oxygen 2 L all the time due to COPD.  CARDIOVASCULAR:Left-sided chest pain radiating to the neck since yesterday evening.  GASTROINTESTINAL: No nausea, vomiting, diarrhea or abdominal pain.  GENITOURINARY: No dysuria, hematuria.  ENDOCRINE: No polyuria, nocturia,  HEMATOLOGY: No anemia, easy bruising or bleeding SKIN: No rash or lesion. MUSCULOSKELETAL: No joint pain or arthritis.   NEUROLOGIC: No tingling, numbness, weakness.  PSYCHIATRY: No anxiety or depression.   MEDICATIONS AT HOME:   Prior to Admission medications   Medication Sig Start Date End Date Taking? Authorizing Provider  albuterol (PROVENTIL HFA;VENTOLIN HFA) 108 (90 Base) MCG/ACT inhaler Inhale 1 puff into the lungs every 6 (six) hours as needed for wheezing or shortness of breath.   Yes [provider]  ALPRAZolam Duanne Moron) 0.5 MG tablet Take 0.5 mg by mouth at bedtime. 12/27/16  Yes [provider]  apixaban (ELIQUIS) 2.5 MG TABS tablet Take 1 tablet (2.5 mg total) by  mouth every 12 (twelve) hours. 08/01/16  Yes Gollan, Kathlene November, MD  cholecalciferol (VITAMIN D) 400 UNITS TABS tablet Take 400 Units by mouth every evening.    Yes [provider]  cyanocobalamin 500 MCG tablet Take 500 mcg by mouth every evening.   Yes [provider]  fenofibrate (TRICOR) 48 MG tablet Take 48 mg by mouth every evening.    Yes [provider]  ferrous sulfate 325 (65 FE) MG EC tablet Take 325 mg by mouth every evening.    Yes [provider]  fluticasone furoate-vilanterol (BREO ELLIPTA) 100-25 MCG/INH AEPB Inhale 1 puff into the lungs daily.   Yes [provider]  furosemide (LASIX) 40 MG tablet Take 1 tablet (40 mg total) by mouth daily. Patient taking differently: Take 20 mg by mouth daily.  04/01/16  Yes Eileen Stanford, PA-C  HYDROcodone-acetaminophen (NORCO) 5-325 MG tablet Take 1 tablet by mouth every 6 (six) hours as needed for moderate pain. 12/08/16  Yes Duanne Guess, PA-C  metoprolol succinate (TOPROL XL) 25 MG 24 hr tablet Take 0.5 tablets (12.5 mg total) by mouth daily. 12/19/16  Yes Minna Merritts, MD  Multiple Vitamins-Minerals (PRESERVISION AREDS 2) CAPS Take 1 capsule by mouth daily.   Yes [provider]  nitroGLYCERIN (NITROSTAT) 0.4 MG SL tablet Place 1 tablet (0.4 mg total) under the tongue every 5 (five) minutes as needed for chest pain. 12/24/14  Yes Barrett, Evelene Croon, PA-C  pantoprazole (PROTONIX) 40 MG tablet Take 40 mg by mouth every evening.    Yes [provider]  sertraline (ZOLOFT) 50 MG tablet Take 1 tablet (50 mg total) by mouth daily. 03/24/16  Yes Gladstone Lighter, MD  tiotropium (SPIRIVA) 18 MCG inhalation capsule Place 18 mcg into inhaler and inhale daily.   Yes [provider]  atorvastatin (LIPITOR) 20 MG tablet Take 1 tablet (20 mg total) by mouth every evening. Patient not taking: Reported on 01/12/2017 12/03/15   Minna Merritts, MD      VITAL SIGNS:  Blood  pressure (!) 136/47, pulse 72, temperature 98.6 F (37 C), temperature source Oral, resp. rate 16, SpO2 91 %.  PHYSICAL EXAMINATION:  GENERAL:  83 y.o.-year-old patient lying in the bed with no acute  distress.  EYES: Pupils equal, round, reactive to light and accommodation. No scleral icterus. Extraocular muscles intact.  HEENT: Head atraumatic, normocephalic. Oropharynx and nasopharynx clear.  NECK:  Supple, no jugular venous distention. No thyroid enlargement, no tenderness.  LUNGS: Normal breath sounds bilaterally, no wheezing, rales,rhonchi or crepitation. No use of accessory muscles of respiration.  CARDIOVASCULAR: S1, S2 normal. No murmurs, rubs, or gallops.  ABDOMEN: Soft, nontender, nondistended. Bowel sounds present. No organomegaly or mass.  EXTREMITIES: No pedal edema, cyanosis, or clubbing.  NEUROLOGIC: Cranial nerves II through XII are intact. Muscle strength 5/5 in all extremities. Sensation intact. Gait not checked.  PSYCHIATRIC: The patient is alert and oriented x 3.  SKIN: No obvious rash, lesion, or ulcer.   LABORATORY PANEL:   CBC  Recent Labs Lab 01/12/17 0908  WBC 11.7*  HGB 9.1*  HCT 28.1*  PLT 213   ------------------------------------------------------------------------------------------------------------------  Chemistries   Recent Labs Lab 01/12/17 0908  NA 135  K 4.8  CL 97*  CO2 27  GLUCOSE 162*  BUN 50*  CREATININE 1.95*  CALCIUM 8.8*   ------------------------------------------------------------------------------------------------------------------  Cardiac Enzymes  Recent Labs Lab 01/12/17 0908  TROPONINI <0.03   ------------------------------------------------------------------------------------------------------------------  RADIOLOGY:  Dg Chest 2 View  Result Date: 01/12/2017 CLINICAL DATA:  Chest pain EXAM: CHEST  2 VIEW COMPARISON:  2020/09/716 FINDINGS: Persistent scarring is noted in the right upper lobe. Surgical clips  are noted. Pacing device is again seen and stable. Cardiac shadow remains enlarged. Aortic calcifications are again noted. Chronic blunting of the costophrenic angles is seen stable from the prior exam. Some mild left basilar atelectasis is noted IMPRESSION: Stable small effusions. Mild left basilar atelectasis. Chronic changes in the right apex. Electronically Signed   By: Inez Catalina M.D.   On: 01/12/2017 09:03    EKG:   Orders placed or performed during the hospital encounter of 01/12/17  . EKG 12-Lead  . EKG 12-Lead  . ED EKG within 10 minutes  . ED EKG within 10 minutes   EKG shows ventricular pacemaker rhythm with, 76 bpm. No ST-T changes . IMPRESSION AND PLAN:   83 year old female patient with coronary artery disease status post PCI, chronic diastolic heart failure comes in with left-sided chest pain radiating to the left neck on the pain relieved with nitroglycerin, her symptoms are concerning for acute coronary syndrome. Admitted to telemetry. Initial traits of troponins are negative and EKG unremarkable.  #1. chest pain in a patient with multiple risk factors of physician hypertension, chronic diastolic heart failure, chronic atrial fibrillation, history of CAD with PCI, her symptoms Improved with nitroglycerin symptoms are concerning for acute coronary syndrome. She is already on full dose  Eliquis.continue that, continue beta blockers, statins, nitroglycerin, consult cardiology, cycle the troponins, monitor on telemetry. Not  on his inhibitors because of renal dysfunction.  2. Chronic respiratory failure due to COPD: On 2 L of oxygen. Continue.continue her inhalers 3 chronic diastolic heart failure: Takes Lasix 40  MG by mouth daily. No lower extremity edema at this time, appears euvolemic. check a BNP. #4 .depression and anxiety: Continue Zoloft and Xanax respectively. 5. hyperlipidemia: Patient is on statins and also TriCor. #6 chronic kidney disease stage III: Stable. #7 GERD:  Continue PPIs #8 history of gout: Stable, continue allopurinol. 9.History of chronic atrial fibrillation: Rate controlled. Patient had history of AV nodal ablation: Continue  Low dose beta blockers, full dose anticoagulation.  10.history of lung cancer status post radiation therapy, not on treatment at this time,  on surveillance. #11.diet controlled diabetes mellitus type 2.  reviewed Medical records in care everywhere from Dr. Mathis Fare note, Dr. Donivan Scull  note.  All the records are reviewed and case discussed with ED provider. Management plans discussed with the patient, family and they are in agreement.  CODE STATUS: DNR  TOTAL TIME TAKING CARE OF THIS PATIENT: 62mnutes.    KEpifanio LeschesM.D on 01/12/2017 at 11:45 AM  Between 7am to 6pm - Pager - [phone]  After 6pm go to [email or web address] - password [email or web address]  Office  [phone] CC: Primary care physician; HTracie Harrier MD  Note: This dictation was prepared with Dragon dictation along with smaller phrase technology. Any transcriptional errors that result from this process are unintentional.

## 2017-01-13 ENCOUNTER — Observation Stay (HOSPITAL_BASED_OUTPATIENT_CLINIC_OR_DEPARTMENT_OTHER)
Admit: 2017-01-13 | Discharge: 2017-01-13 | Disposition: A | Discharge status: Home / Self Care | Payer: Medicare Other | Attending: Cardiovascular Disease | Admitting: Cardiovascular Disease

## 2017-01-13 DIAGNOSIS — R071 Chest pain on breathing: Secondary | ICD-10-CM

## 2017-01-13 DIAGNOSIS — I350 Nonrheumatic aortic (valve) stenosis: Secondary | ICD-10-CM

## 2017-01-13 DIAGNOSIS — I2510 Atherosclerotic heart disease of native coronary artery without angina pectoris: Secondary | ICD-10-CM

## 2017-01-13 DIAGNOSIS — I482 Chronic atrial fibrillation: Secondary | ICD-10-CM

## 2017-01-13 LAB — CBC
HCT: 24.5 % — ABNORMAL LOW (ref 35.0–47.0)
HEMOGLOBIN: 8.1 g/dL — AB (ref 12.0–16.0)
MCH: 26.7 pg (ref 26.0–34.0)
MCHC: 33.2 g/dL (ref 32.0–36.0)
MCV: 80.2 fL (ref 80.0–100.0)
Platelets: 175 10*3/uL (ref 150–440)
RBC: 3.05 MIL/uL — AB (ref 3.80–5.20)
RDW: 17.2 % — ABNORMAL HIGH (ref 11.5–14.5)
WBC: 10.5 10*3/uL (ref 3.6–11.0)

## 2017-01-13 LAB — BASIC METABOLIC PANEL
ANION GAP: 11 (ref 5–15)
BUN: 61 mg/dL — ABNORMAL HIGH (ref 6–20)
CALCIUM: 8.5 mg/dL — AB (ref 8.9–10.3)
CHLORIDE: 96 mmol/L — AB (ref 101–111)
CO2: 29 mmol/L (ref 22–32)
Creatinine, Ser: 2.52 mg/dL — ABNORMAL HIGH (ref 0.44–1.00)
GFR calc non Af Amer: 17 mL/min — ABNORMAL LOW (ref >60)
GFR, EST AFRICAN AMERICAN: 19 mL/min — AB (ref >60)
GLUCOSE: 140 mg/dL — AB (ref 65–99)
Potassium: 4.4 mmol/L (ref 3.5–5.1)
Sodium: 136 mmol/L (ref 135–145)

## 2017-01-13 LAB — GLUCOSE, CAPILLARY: GLUCOSE-CAPILLARY: 146 mg/dL — AB (ref 65–99)

## 2017-01-13 LAB — ECHOCARDIOGRAM COMPLETE
Height: 64 in
WEIGHTICAEL: 2938.29 [oz_av]

## 2017-01-13 MED ORDER — PERFLUTREN LIPID MICROSPHERE
1.0000 mL | INTRAVENOUS | Status: AC | PRN
  Administered 2017-01-13: 4 mL via INTRAVENOUS
  Filled 2017-01-13: qty 10

## 2017-01-13 NOTE — Care Management Obs Status (Signed)
[facility] NOTIFICATION   Patient Details  Name: CHASELYN NANNEY MRN: [id number] Date of Birth: 02-27-1933   Medicare Observation Status Notification Given:  No (No MOON letter. Discharge within 24 hours of admission.)    Lazaro Isenhower A, RN 01/13/2017, 11:11 AM

## 2017-01-13 NOTE — Progress Notes (Signed)
Patient discharged home per MD order and hospital protocol. Patient verbalized understanding of discharge instructions,medications, and follow up appointments. Patient escorted off the unit on her2L O2 nasal cannula oxygen via hospital staff.

## 2017-01-13 NOTE — Progress Notes (Signed)
SUBJECTIVE: The patient is doing well today.  At this time, she denies chest pain, shortness of breath, or any new concerns.  . ALPRAZolam  0.5 mg Oral QHS  . apixaban  2.5 mg Oral Q12H  . aspirin EC  81 mg Oral Daily  . atorvastatin  20 mg Oral QPM  . docusate sodium  100 mg Oral BID  . fenofibrate  54 mg Oral Daily  . ferrous sulfate  325 mg Oral QPM  . fluticasone furoate-vilanterol  1 puff Inhalation Daily  . furosemide  40 mg Oral Daily  . metoprolol succinate  12.5 mg Oral Daily  . multivitamin-lutein  1 capsule Oral Daily  . pantoprazole  40 mg Oral QPM  . sertraline  50 mg Oral Daily  . tiotropium  18 mcg Inhalation Daily  . vitamin B-12  500 mcg Oral QPM     OBJECTIVE: Physical Exam: Vitals:   01/12/17 1945 01/12/17 2141 01/13/17 0440 01/13/17 0850  BP: (!) 102/21 (!) 121/44 (!) 129/42 (!) 130/49  Pulse: 70 72 71 80  Resp: 17  18   Temp: 99.5 F (37.5 C)  98.6 F (37 C) 98.2 F (36.8 C)  TempSrc: Oral  Oral Oral  SpO2: 98%  98% 96%  Weight:   183 lb 10.3 oz (83.3 kg)   Height:        Intake/Output Summary (Last 24 hours) at 01/13/17 1356 Last data filed at 01/13/17 7824  Gross per 24 hour  Intake              600 ml  Output              250 ml  Net              350 ml    GEN- The patient is overweight appearing, alert and oriented x 3 today.   Head- normocephalic, atraumatic Eyes-  Sclera clear, conjunctiva pink Ears- hearing intact Oropharynx- clear Neck- supple,   Lungs- Clear to ausculation bilaterally, normal work of breathing Heart- Regular rate and rhythm, no murmurs, rubs or gallops  GI- soft, NT, ND, + BS Extremities- no clubbing, cyanosis, or edema Skin- no rash or lesion Psych- euthymic mood, full affect Neuro- strength and sensation are intact  LABS: Basic Metabolic Panel:  Recent Labs  01/12/17 0908 01/13/17 0420  NA 135 136  K 4.8 4.4  CL 97* 96*  CO2 27 29  GLUCOSE 162* 140*  BUN 50* 61*  CREATININE 1.95* 2.52*    CALCIUM 8.8* 8.5*   Liver Function Tests: No results for input(s): AST, ALT, ALKPHOS, BILITOT, PROT, ALBUMIN in the last 72 hours. No results for input(s): LIPASE, AMYLASE in the last 72 hours. CBC:  Recent Labs  01/12/17 0908 01/13/17 0420  WBC 11.7* 10.5  HGB 9.1* 8.1*  HCT 28.1* 24.5*  MCV 80.9 80.2  PLT 213 175   Cardiac Enzymes:  Recent Labs  01/12/17 1122 01/12/17 1709 01/12/17 2251  TROPONINI <0.03 <0.03 <0.03    Echo is reviewed   ASSESSMENT AND PLAN:   1. Pleuritic chest pain Now resolved Possibly due to pericarditis (very small effusion noted on echo) No suggestion of AMI.  No further inpatient CV workup planned. I agree with Dr Fletcher Anon that we should avoid NSAIDs as able given renal failure As symptoms are resolved, no further management may be required  2. CAD No ischemic symptoms Continue home medicines  3. Permanent afib Rate controlled On eliquis  4. HTN  Stable No change required today  5. CKD stage IV Stable No change required today  6. Chronic combined systolic/ diastolic CHF EF has returned to normal s/p AV nodal ablation  No further inpatient cardiology follow-up is planned Follow-up with Dr Rockey Situ if symptoms return   Thompson Grayer, MD 01/13/2017 1:56 PM

## 2017-01-13 NOTE — Progress Notes (Signed)
*  PRELIMINARY RESULTS* Echocardiogram 2D Echocardiogram has been performed. Definity Contrast used on this exam.  Regina Burke 01/13/2017, 9:44 AM

## 2017-01-16 ENCOUNTER — Other Ambulatory Visit
Admission: RE | Admit: 2017-01-16 | Discharge: 2017-01-16 | Disposition: A | Discharge status: Home / Self Care | Payer: Medicare Other | Source: Ambulatory Visit | Attending: Cardiovascular Disease | Admitting: Cardiovascular Disease

## 2017-01-16 ENCOUNTER — Ambulatory Visit (INDEPENDENT_AMBULATORY_CARE_PROVIDER_SITE_OTHER): Payer: Medicare Other | Admitting: Cardiovascular Disease

## 2017-01-16 ENCOUNTER — Encounter: Payer: Self-pay | Admitting: Cardiovascular Disease

## 2017-01-16 VITALS — BP 126/50 | HR 71 | Ht 64.0 in | Wt 181.5 lb

## 2017-01-16 DIAGNOSIS — R710 Precipitous drop in hematocrit: Secondary | ICD-10-CM | POA: Diagnosis not present

## 2017-01-16 LAB — CBC WITH DIFFERENTIAL/PLATELET
BASOS PCT: 1 %
Basophils Absolute: 0.1 10*3/uL (ref 0–0.1)
Eosinophils Absolute: 0.3 10*3/uL (ref 0–0.7)
Eosinophils Relative: 3 %
HEMATOCRIT: 27.1 % — AB (ref 35.0–47.0)
HEMOGLOBIN: 8.9 g/dL — AB (ref 12.0–16.0)
LYMPHS ABS: 0.6 10*3/uL — AB (ref 1.0–3.6)
LYMPHS PCT: 6 %
MCH: 26.8 pg (ref 26.0–34.0)
MCHC: 32.9 g/dL (ref 32.0–36.0)
MCV: 81.3 fL (ref 80.0–100.0)
MONO ABS: 0.5 10*3/uL (ref 0.2–0.9)
MONOS PCT: 5 %
Neutro Abs: 8.3 10*3/uL — ABNORMAL HIGH (ref 1.4–6.5)
Neutrophils Relative %: 85 %
Platelets: 279 10*3/uL (ref 150–440)
RBC: 3.34 MIL/uL — ABNORMAL LOW (ref 3.80–5.20)
RDW: 17 % — ABNORMAL HIGH (ref 11.5–14.5)
WBC: 9.6 10*3/uL (ref 3.6–11.0)

## 2017-01-16 NOTE — Progress Notes (Signed)
Cardiology Office Note  Date:  01/16/2017   ID:  Regina Burke, DOB [birth year], MRN [id number]  PCP:  Tracie Harrier, MD   Chief Complaint  Patient presents with  . other    ED follow up. Patient denies chest pain. Meds reviewed verbally with patient.     HPI:   Regina Burke is a 83 y.o. female With  COPD on chronic oxygen,    coronary artery disease with hospitalization April 2016 stent placed to marginal branch of the circumflex, Persistent atrial fibrillation,  03/31/2016, AV node ablation,  Biventricular ICD implantation severe anemia, previous epo shot lung cancer, diabetes mellitus,  episode of acute respiratory distress requiring hospitalization  who presents for routine follow-up of her atrial fibrillation, chronic diastolic CHF  Recent evaluation in the hospital for atypical chest pain Evaluated by cardiology 01/13/2017  Felt to have atypical symptoms   patient feels that her symptoms were secondary to  xopenex After she did nebulizer treatment she had acute onset of discomfort  Since her discharge she reports having numerous black stools 3 on Sunday, several on Monday Review of lab work shows drop in hemoglobin during her recent hospital admission which was not addressed  She reports previous evaluation by Dr. Vira Agar several years ago Other members of her care team include  Dr. Grayland Ormond Dr. Ginette Pitman  She is feeling weaker, short of breath, family reports she is very pale  EKG on today's visit shows atrial fibrillation with rate 71 bpm paced rhythm  Other past medical history reviewed Chronic lower back pain, central back pain, worse after she has been standing for long periods of time  Uses a cane.   admission to [facility] with acute respiratory distress, pneumonia, bilateral pleural effusions requiring thoracentesis, acute on chronic diastolic heart failure,   hospital admission in August 2016  for electrolyte abnormality, digoxin toxicity, readmitted  06/07/2015 with shortness of breath, found to have atrial fibrillation , potassium of 7, renal failure, anemia who presents for routine follow-up after recent hospitalization for atrial fibrillation with RVR  Previously on amiodarone infusion for atrial fibrillation , converted shortly after to normal sinus rhythm She did have IV infiltration of the left forearm, had to go back to the emergency room and was treated with a special regiment as detailed in the notes.   admission October 2016 at [facility] recent hospitalization for respiratory distress, etiology Likely multifactorial including profound anemia, atrial fibrillation, severe COPD, pleural effusions . she has not qualified for BiPAP, "did the wrong sleep study (CPAP)."  In the hospital had significant anemia, blood count down to hemoglobin 6.7  Was in atrial fibrillation but Converted to normal sinus rhythm after thoracentesis (first one) Breathing better after second thoracentesis   She did have Digoxin toxicity, This was not restarted at discharge   Presented to [facility] on  12/22/2014 with chest pain.   cardiac catheterization  found to have mild 50% disease in the diagonal branch of the LAD, but high-grade 90% hazy stenosis in the marginal branch of the circumflex.  She underwent successful PTCA and insertion of a 2.25x 8 millimeter Promus premier DES stent at the ostium of the OM3 vessell which was postdilated to 2.5 mm.   PMH:   has a past medical history of Acute on chronic combined systolic and diastolic CHF (congestive heart failure) ([place]); Anemia; Arthritis; Asthma; Cardiac resynchronization therapy pacemaker (CRT-P) in place; CKD (chronic kidney disease), stage IV ([place]); COPD (chronic obstructive pulmonary disease) ([place]); Coronary artery disease; Cough;  Cushing's disease ([place]); Depression; Diverticulitis; Edema; GERD (gastroesophageal reflux disease); Gout; History of hiatal hernia; History of pneumonia; HLD (hyperlipidemia); HOH  (hard of hearing); Hypertensive heart disease; Lung cancer ([place]) (dx'd 2014); Migraine; Mixed Ischemic and Nonischemic Cardiomyopathy ([place]); Moderate mitral regurgitation; Multiple allergies; Myocardial infarction ([place]); Oxygen deficiency; Persistent atrial fibrillation ([place]); Presence of permanent cardiac pacemaker; S/P AV nodal ablation; Sleep apnea; and Type II diabetes mellitus ([place]).  PSH:    Past Surgical History:  Procedure Laterality Date  . ABDOMINAL HYSTERECTOMY    . ABLATION     July 2017  . ADRENALECTOMY Left 1980's   "[place]"  . APPENDECTOMY    . BREAST CYST EXCISION Left   . CATARACT EXTRACTION W/PHACO Right 12/28/2015   Procedure: CATARACT EXTRACTION PHACO AND INTRAOCULAR LENS PLACEMENT (IOC);  Surgeon: Birder Robson, MD;  Location: [facility] ORS;  Service: Ophthalmology;  Laterality: Right;  [place] 48.4   . CATARACT EXTRACTION W/PHACO Left 11/14/2016   Procedure: CATARACT EXTRACTION PHACO AND INTRAOCULAR LENS PLACEMENT (IOC);  Surgeon: Birder Robson, MD;  Location: [facility] ORS;  Service: Ophthalmology;  Laterality: Left;  [place] 59.8 AP% 18.1 CDE 10.78 Fluid pack lot # [id number]  . CHOLECYSTECTOMY    . CORONARY ANGIOPLASTY WITH STENT PLACEMENT  12/23/2014  . ELECTROPHYSIOLOGIC STUDY N/A 03/08/2016   Procedure: CARDIOVERSION;  Surgeon: Wende Bushy, MD;  Location: [facility] ORS;  Service: Cardiovascular;  Laterality: N/A;  . ELECTROPHYSIOLOGIC STUDY N/A 03/07/2016   Procedure: Cardioversion;  Surgeon: Wende Bushy, MD;  Location: [facility] ORS;  Service: Cardiovascular;  Laterality: N/A;  . ELECTROPHYSIOLOGIC STUDY N/A 03/31/2016   Procedure: AV Node Ablation;  Surgeon: Will Meredith Leeds, MD;  Location: [place] CV LAB;  Service: Cardiovascular;  Laterality: N/A;  . EP IMPLANTABLE DEVICE N/A 03/31/2016   Procedure: BiV Pacemaker Insertion CRT-P;  Surgeon: Will Meredith Leeds, MD;  Location: [place] CV LAB;  Service: Cardiovascular;  Laterality: N/A;  . FRACTURE SURGERY    . INSERT / REPLACE  / REMOVE PACEMAKER  02/2016  . LEFT HEART CATHETERIZATION WITH CORONARY ANGIOGRAM N/A 12/23/2014   Procedure: LEFT HEART CATHETERIZATION WITH CORONARY ANGIOGRAM;  Surgeon: Burnell Blanks, MD;  Location: [facility] C[facility] LAB;  Service: Cardiovascular;  Laterality: N/A;  . PERCUTANEOUS CORONARY STENT INTERVENTION (PCI-S)  12/23/2014   Procedure: PERCUTANEOUS CORONARY STENT INTERVENTION (PCI-S);  Surgeon: Burnell Blanks, MD;  Location: [facility] CATH LAB;  Service: Cardiovascular;;  Promus 2.25x8  . TONSILLECTOMY    . TRANSTHORACIC ECHOCARDIOGRAM  11/26/2015   Technically difficult study. EF 55-60%. Normal wall motion. GR 1 DD.  . TUBAL LIGATION    . WRIST FRACTURE SURGERY Bilateral ~ 2000    Current Outpatient Prescriptions  Medication Sig Dispense Refill  . ALPRAZolam (XANAX) 0.5 MG tablet Take 0.5 mg by mouth at bedtime.    Marland Kitchen apixaban (ELIQUIS) 2.5 MG TABS tablet Take 1 tablet (2.5 mg total) by mouth every 12 (twelve) hours. 180 tablet 3  . atorvastatin (LIPITOR) 20 MG tablet Take 1 tablet (20 mg total) by mouth every evening. 90 tablet 3  . cholecalciferol (VITAMIN D) 400 UNITS TABS tablet Take 400 Units by mouth every evening.     . cyanocobalamin 500 MCG tablet Take 500 mcg by mouth every evening.    . fenofibrate (TRICOR) 48 MG tablet Take 48 mg by mouth every evening.     . ferrous sulfate 325 (65 FE) MG EC tablet Take 325 mg by mouth every evening.     . fluticasone furoate-vilanterol (BREO ELLIPTA) 100-25 MCG/INH  AEPB Inhale 1 puff into the lungs daily.    . furosemide (LASIX) 40 MG tablet Take 1 tablet (40 mg total) by mouth daily. (Patient taking differently: Take 20 mg by mouth daily. ) 30 tablet 6  . HYDROcodone-acetaminophen (NORCO) 5-325 MG tablet Take 1 tablet by mouth every 6 (six) hours as needed for moderate pain. 20 tablet 0  . metoprolol succinate (TOPROL XL) 25 MG 24 hr tablet Take 0.5 tablets (12.5 mg total) by mouth daily.    . Multiple Vitamins-Minerals (PRESERVISION  AREDS 2) CAPS Take 1 capsule by mouth daily.    . nitroGLYCERIN (NITROSTAT) 0.4 MG SL tablet Place 1 tablet (0.4 mg total) under the tongue every 5 (five) minutes as needed for chest pain. 25 tablet 3  . pantoprazole (PROTONIX) 40 MG tablet Take 40 mg by mouth every evening.     . sertraline (ZOLOFT) 50 MG tablet Take 1 tablet (50 mg total) by mouth daily. 30 tablet 2  . tiotropium (SPIRIVA) 18 MCG inhalation capsule Place 18 mcg into inhaler and inhale daily.     No current facility-administered medications for this visit.      Allergies:   Ciprofloxacin; Doxycycline; Penicillins; Sulfa antibiotics; Latex; Morphine and related; Albuterol sulfate; and Cefuroxime   Social History:  The patient  reports that she quit smoking about 22 years ago. Her smoking use included Cigarettes. She has a 45.00 pack-year smoking history. She has never used smokeless tobacco. She reports that she does not drink alcohol or use drugs.   Family History:   family history includes COPD in her brother; Diabetes in her mother; Heart disease in her father and mother; Hypertension in her father and mother; Osteoarthritis in her mother.    Review of Systems: Review of Systems  Constitutional: Positive for malaise/fatigue.  Respiratory: Positive for shortness of breath.   Cardiovascular: Negative.   Gastrointestinal: Negative.   Musculoskeletal: Negative.   Neurological: Positive for weakness.  Psychiatric/Behavioral: Negative.   All other systems reviewed and are negative.    PHYSICAL EXAM: VS:  BP (!) 126/50 (BP Location: Right Arm, Patient Position: Sitting, Cuff Size: Normal)   Pulse 71   Ht '5\' 4"'$  (1.626 m)   Wt 181 lb 8 oz (82.3 kg)   BMI 31.15 kg/m  , BMI Body mass index is 31.15 kg/m.  GEN: Well nourished, well developed, in no acute distress ,  Cane, obese unsteady gait  Appears very pale  HEENT: normal  Neck: no JVD, carotid bruits, or masses Cardiac: RRR; no murmurs, rubs, or gallops,no edema   Respiratory:  clear to auscultation bilaterally, normal work of breathing GI: soft, nontender, nondistended, + BS MS: no deformity or atrophy  Skin: warm and dry, no rash Neuro:  Strength and sensation are intact Psych: euthymic mood, full affect    Recent Labs: 03/17/2016: Magnesium 2.1 03/18/2016: ALT 69 01/12/2017: B Natriuretic Peptide 292.0 01/13/2017: BUN 61; Creatinine, Ser 2.52; Hemoglobin 8.1; Platelets 175; Potassium 4.4; Sodium 136    Lipid Panel Lab Results  Component Value Date   CHOL 202 (H) 12/23/2014   HDL 28 (L) 12/23/2014   LDLCALC 107 (H) 12/23/2014   TRIG 337 (H) 12/23/2014      Wt Readings from Last 3 Encounters:  01/16/17 181 lb 8 oz (82.3 kg)  01/13/17 183 lb 10.3 oz (83.3 kg)  12/28/16 190 lb (86.2 kg)       ASSESSMENT AND PLAN:  Chronic diastolic CHF (congestive heart failure) (HCC) - Plan: EKG 12-Lead  Appears  euvolemic  Currently not on ACE inhibitor, ARB given renal dysfunction Currently on Lasix 40 daily  Persistent atrial fibrillation (HCC) - Plan: EKG 12-Lead s/p AV node ablation, ICD Black stool past several days with worsening anemia CBC ordered stat today Recommended she stop her anticoagulation We will contact primary care and oncology  Coronary artery disease involving native coronary artery of native heart without angina pectoris Currently with no symptoms of angina. No further workup at this time. Continue current medication regimen.  Essential hypertension We will add very low-dose metoprolol succinate at her request  S/P AV nodal ablation  Iron deficiency anemia due to chronic blood loss Suspect blood loss anemia in the setting of anticoagulation Recommended that she stop her eliquis, Stat CBC ordered If this continues to drop, she may need transfusion Will cc Dr. Grayland Ormond and Dr. Ginette Pitman. She may need reevaluation by Dr. Vira Agar   Chronic obstructive pulmonary disease, unspecified COPD type ([place]) She denies any  recent COPD exacerbation On chronic oxygen Recent chest discomfort on Xopenex    Total encounter time more than 45 minutes  Greater than 50% was spent in counseling and coordination of care with the patient   Disposition:   F/U  1 month   No orders of the defined types were placed in this encounter.    Signed, Esmond Plants, M.D., Ph.D. 01/16/2017  [place], [place]

## 2017-01-16 NOTE — Patient Instructions (Signed)
Medication Instructions:   Stop the eliquis for now  Labwork:  We will check labs today  Testing/Procedures:  No further testing at this time   I recommend watching educational videos on topics of interest to you at:       [email or web address]  Enter code: [email or web address]    Follow-Up: It was a pleasure seeing you in the office today. Please call us if you have new issues that need to be addressed before your next appt.  [phone]  Your physician wants you to follow-up in: 1 month.    If you need a refill on your cardiac medications before your next appointment, please call your pharmacy.

## 2017-01-17 ENCOUNTER — Telehealth: Payer: Self-pay | Admitting: *Deleted

## 2017-01-17 MED ORDER — APIXABAN 2.5 MG PO TABS: 2.5000 mg | ORAL_TABLET | Freq: Two times a day (BID) | ORAL | 3 refills | Status: DC

## 2017-01-17 NOTE — Telephone Encounter (Signed)
Per Dr Rogue Bussing, after he read Dr Gwenyth Ober note which reports that she has been having black stools. He feels that she should remain off the Eliquis until further testing can be done to determine the cause of her black stools, occults blood testing or GI evaluation per Dr Gwenyth Ober note. I called Dr Linton Ham office and spoke with receptionist Sharyn Lull who stated that the nurse was not available. I left message to see f Dr Ginette Pitman got Dr Donivan Scull note and explained that Dr Grayland Ormond is out of the office until next week, I asked if Dr Linton Ham would make to referral to Dr Vira Agar. She said she will get the message to his nurse.

## 2017-01-17 NOTE — Telephone Encounter (Signed)
Called to see if her appt should be moved up from 6/4 due to Dr Rockey Situ taking her off Eliquis and stating her Hgb has dropped significantly. Her Hgb is 8.9 (this is up from 8.1 5/19) She ranges from 7.9 - 10.6 over the past year. When I explained this to her son, he was not as worried, but asked should she stay off the Eliquis until she is seen by TF on 01/29/17?  Please advise

## 2017-01-17 NOTE — Telephone Encounter (Signed)
I spoke with son [name] and stated 3 times not to restart Eliquis, Dr Ginette Pitman will be referring her to GI. He stated that she has an appt next Thursday with Dr Ginette Pitman and repeated back not to restart the Eliquis

## 2017-01-17 NOTE — Telephone Encounter (Signed)
Dr Ginette Pitman returned my call and said that he will refer her to GI, to hold the Eliquis and he will let GI do Occult blood testing on her.

## 2017-01-18 NOTE — Addendum Note (Signed)
Addended by: Janan Ridge on: 01/18/2017 03:04 PM   Modules accepted: Orders

## 2017-01-18 NOTE — Discharge Summary (Signed)
Regina Burke, is a 83 y.o. female  DOB [birth year]  MRN [id number].  Admission date:  01/12/2017  Admitting Physician  Epifanio Lesches, MD  Discharge Date:  01/13/2017   Primary MD  Tracie Harrier, MD  Recommendations for primary care physician for things to follow:  Follow with PMD in 2-3 days Follow with [facility] cardio for out pt follow up/   Admission Diagnosis  Chest pain, unspecified type [R07.9]   Discharge Diagnosis  Chest pain, unspecified type [R07.9]    Active Problems:   Chest pain      Past Medical History:  Diagnosis Date  . Acute on chronic combined systolic and diastolic CHF (congestive heart failure) ([place])    a. 10/2015 Echo: EF 55-65%, Gr1 DD, mild MR, mildly dil LA, nl RV fxn, nl PASP;  b. 03/2016 Echo: EF 30-35%, mod MR, mod dil LA.  Marland Kitchen Anemia   . Arthritis   . Asthma   . Cardiac resynchronization therapy pacemaker (CRT-P) in place    a. 03/31/16:  Medtronic Percepta Quad CRT-P MRI SureScan (serial Number [id number]) device.  . CKD (chronic kidney disease), stage IV ([place])   . COPD (chronic obstructive pulmonary disease) ([place])   . Coronary artery disease    a. 11/2014 NSTEMI/PCI: LM nl, LAD 69m, D1 30, LCX mild dzs, OM1 20p, OM2 49m, OM3 90p (2.25x8 Promus Premier DES), RCA nl.   . Cough    CHRONIC AT NIGHT  . Cushing's disease ([place])   . Depression   . Diverticulitis   . Edema    FEET/LEGS  . GERD (gastroesophageal reflux disease)   . Gout   . History of hiatal hernia   . History of pneumonia   . HLD (hyperlipidemia)   . HOH (hard of hearing)   . Hypertensive heart disease   . Lung cancer ([place]) dx'd 2014   S/P radiation 2015  . Migraine   . Mixed Ischemic and Nonischemic Cardiomyopathy ([place])    a. 03/2016 Echo: EF 30-35%.  . Moderate mitral regurgitation   . Multiple  allergies   . Myocardial infarction ([place])   . Oxygen deficiency    2LITERS  . Persistent atrial fibrillation (HCC)    a. on Eliquis 2.5 mg bid (age & SCr); b. CHADS2VASc ==> 7 (CHF, HTN, age x 2, DM, vascular disease and sex category);  c. 03/2016 s/p AVN and MDT BiV ICD placement.  . Presence of permanent cardiac pacemaker   . S/P AV nodal ablation    a. on 03/31/16 for persistant afib with CRT-P placement  . Sleep apnea   . Type II diabetes mellitus ([place])     Past Surgical History:  Procedure Laterality Date  . ABDOMINAL HYSTERECTOMY    . ABLATION     July 2017  . ADRENALECTOMY Left 1980's   "[place]"  . APPENDECTOMY    . BREAST CYST EXCISION Left   . CATARACT EXTRACTION W/PHACO Right 12/28/2015   Procedure: CATARACT EXTRACTION PHACO AND INTRAOCULAR LENS PLACEMENT (IOC);  Surgeon: Birder Robson, MD;  Location: [facility] ORS;  Service: Ophthalmology;  Laterality: Right;  [place] 48.4   . CATARACT EXTRACTION W/PHACO Left 11/14/2016   Procedure: CATARACT EXTRACTION PHACO AND INTRAOCULAR LENS PLACEMENT (IOC);  Surgeon: Birder Robson, MD;  Location: [facility] ORS;  Service: Ophthalmology;  Laterality: Left;  [place] 59.8 AP% 18.1 CDE 10.78 Fluid pack lot # [id number]  . CHOLECYSTECTOMY    . CORONARY ANGIOPLASTY WITH STENT PLACEMENT  12/23/2014  . ELECTROPHYSIOLOGIC STUDY  N/A 03/08/2016   Procedure: CARDIOVERSION;  Surgeon: Wende Bushy, MD;  Location: [facility] ORS;  Service: Cardiovascular;  Laterality: N/A;  . ELECTROPHYSIOLOGIC STUDY N/A 03/07/2016   Procedure: Cardioversion;  Surgeon: Wende Bushy, MD;  Location: [facility] ORS;  Service: Cardiovascular;  Laterality: N/A;  . ELECTROPHYSIOLOGIC STUDY N/A 03/31/2016   Procedure: AV Node Ablation;  Surgeon: Will Meredith Leeds, MD;  Location: [place] [facility];  Service: Cardiovascular;  Laterality: N/A;  . EP IMPLANTABLE DEVICE N/A 03/31/2016   Procedure: BiV Pacemaker Insertion CRT-P;  Surgeon: Will Meredith Leeds, MD;  Location: [place] [facility];  Service:  Cardiovascular;  Laterality: N/A;  . FRACTURE SURGERY    . INSERT / REPLACE / REMOVE PACEMAKER  02/2016  . LEFT HEART CATHETERIZATION WITH CORONARY ANGIOGRAM N/A 12/23/2014   Procedure: LEFT HEART CATHETERIZATION WITH CORONARY ANGIOGRAM;  Surgeon: Burnell Blanks, MD;  Location: [facility] CATH LAB;  Service: Cardiovascular;  Laterality: N/A;  . PERCUTANEOUS CORONARY STENT INTERVENTION (PCI-S)  12/23/2014   Procedure: PERCUTANEOUS CORONARY STENT INTERVENTION (PCI-S);  Surgeon: Burnell Blanks, MD;  Location: [facility] CATH LAB;  Service: Cardiovascular;;  Promus 2.25x8  . TONSILLECTOMY    . TRANSTHORACIC ECHOCARDIOGRAM  11/26/2015   Technically difficult study. EF 55-60%. Normal wall motion. GR 1 DD.  . TUBAL LIGATION    . WRIST FRACTURE SURGERY Bilateral ~ 2000       History of present illness and  Hospital Course:     Kindly see H&P for history of present illness and admission details, please review complete Labs, Consult reports and Test reports for all details in brief  HPI  from the history and physical done on the day of admission Regina Burke  is a 83 y.o. female with a known history of Essential hypertension, coronary artery disease status post PCI in 1275, chronic diastolic heart failure who follows up with [facility] cardiology comes in with left-sided chest pains  relieved with nitroglycerin. . No associated radiation to the left  arm. Patient does have nausea but denies Regina vomiting. No dizziness. Does have shortness of breath but she says that she has COPD and her shortness of breath is not worse. The first set of troponins are negative, EKG did not show Regina new changes. Also complains of dry cough, unable to get the phlegm, chest pain is worse with deep breath.Marland Kitchen    Hospital Course  1. Pleuritic chest pain;admitted to tele metry.troponins negative for 3 times.seen by [facility] health Cardio DR.James Allred  Possibly due to pericarditis (very small effusion noted on echo) No  suggestion of AMI.  No further inpatient CV workup planned. I agree with Dr Fletcher Anon that we should avoid NSAIDs as able given renal failure As symptoms are resolved, no further management may be required  2. CAD No ischemic symptoms Continue home medicines  3. Permanent afib Rate controlled On eliquis  4. HTN Stable;continue home medication. 5. CKD stage IV Stable   6. Chronic combined systolic/ diastolic CHF;takes lasix at  Home. EF has returned to normal s/p AV nodal ablation'  7.GERD;continue PPI> 8.h/o LUng CA s/p radiation therapy. 9.Diet controlled DMII 10.HLP;continue Statins.,Tricor, 11/Depression/Anxiety;continue Zoloftand xanax.   Discharge Condition:stable   Follow UP  Follow-up Information    Tracie Harrier, MD Follow up in 1 week(s).   Specialty:  Internal Medicine Contact information: [place] [address] [phone]        Minna Merritts, MD In 3 days.   Specialty:  Cardiology Contact information: [place] [address] [phone]             Discharge Instructions  and  Discharge Medications      Allergies as of 01/13/2017      Reactions   Ciprofloxacin Shortness Of Breath, Itching, Rash   Doxycycline Shortness Of Breath, Itching, Rash   Penicillins Shortness Of Breath, Itching, Rash, Other (See Comments)   Has patient had a PCN reaction causing immediate rash, facial/tongue/throat swelling, SOB or lightheadedness with hypotension: Yes Has patient had a PCN reaction causing severe rash involving mucus membranes or skin necrosis: No Has patient had a PCN reaction that required hospitalization No Has patient had a PCN reaction occurring within the last 10 years: No If all of the above answers are "NO", then may proceed with Cephalosporin use.   Sulfa Antibiotics Shortness Of Breath, Itching, Rash   Latex Itching   Morphine And Related Itching    Albuterol Sulfate    Cefuroxime Rash   Blisters in mouth      Medication List    STOP taking these medications   albuterol 108 (90 Base) MCG/ACT inhaler Commonly known as:  PROVENTIL HFA;VENTOLIN HFA     TAKE these medications   ALPRAZolam 0.5 MG tablet Commonly known as:  XANAX Take 0.5 mg by mouth at bedtime.   atorvastatin 20 MG tablet Commonly known as:  LIPITOR Take 1 tablet (20 mg total) by mouth every evening.   BREO ELLIPTA 100-25 MCG/INH Aepb Generic drug:  fluticasone furoate-vilanterol Inhale 1 puff into the lungs daily.   cholecalciferol 400 units Tabs tablet Commonly known as:  VITAMIN D Take 400 Units by mouth every evening.   cyanocobalamin 500 MCG tablet Take 500 mcg by mouth every evening.   fenofibrate 48 MG tablet Commonly known as:  TRICOR Take 48 mg by mouth every evening.   ferrous sulfate 325 (65 FE) MG EC tablet Take 325 mg by mouth every evening.   furosemide 40 MG tablet Commonly known as:  LASIX Take 1 tablet (40 mg total) by mouth daily. What changed:  how much to take   HYDROcodone-acetaminophen 5-325 MG tablet Commonly known as:  NORCO Take 1 tablet by mouth every 6 (six) hours as needed for moderate pain.   nitroGLYCERIN 0.4 MG SL tablet Commonly known as:  NITROSTAT Place 1 tablet (0.4 mg total) under the tongue every 5 (five) minutes as needed for chest pain.   pantoprazole 40 MG tablet Commonly known as:  PROTONIX Take 40 mg by mouth every evening.   PRESERVISION AREDS 2 Caps Take 1 capsule by mouth daily.   sertraline 50 MG tablet Commonly known as:  ZOLOFT Take 1 tablet (50 mg total) by mouth daily.   tiotropium 18 MCG inhalation capsule Commonly known as:  SPIRIVA Place 18 mcg into inhaler and inhale daily.   TOPROL XL 25 MG 24 hr tablet Generic drug:  metoprolol succinate Take 0.5 tablets (12.5 mg total) by mouth daily.         Diet and Activity recommendation: See Discharge Instructions  above   Consults obtained - cardio   Major procedures and Radiology Reports - PLEASE review detailed and final reports for all details, in brief -      Dg Chest 2 View  Result Date: 01/12/2017 CLINICAL DATA:  Chest pain EXAM: CHEST  2 VIEW COMPARISON:  Feb 01, 202018 FINDINGS: Persistent scarring is noted in the right upper lobe. Surgical clips are noted.  Pacing device is again seen and stable. Cardiac shadow remains enlarged. Aortic calcifications are again noted. Chronic blunting of the costophrenic angles is seen stable from the prior exam. Some mild left basilar atelectasis is noted IMPRESSION: Stable small effusions. Mild left basilar atelectasis. Chronic changes in the right apex. Electronically Signed   By: Inez Catalina M.D.   On: 01/12/2017 09:03   Dg Chest 2 View  Result Date: 12/28/2016 CLINICAL DATA:  Shortness of breath for several days EXAM: CHEST  2 VIEW COMPARISON:  04/01/2016 FINDINGS: Cardiac shadow is mildly enlarged. Pacing device is again seen and stable. Postsurgical changes are noted overlying the right apex. Chronic scarring is noted in the right upper lobe. Improved aeration is noted in the bases bilaterally. Small pleural effusions remain. IMPRESSION: Overall improved aeration.  Small pleural effusions remain. Electronically Signed   By: Inez Catalina M.D.   On: June 20, 202018 10:29    Micro Results    No results found for this or Regina previous visit (from the past 240 hour(s)).     Today   Subjective:   Regina Burke today has no  Further chest pain,eager to  Go home.  Objective:   Blood pressure (!) 130/49, pulse 80, temperature 98.2 F (36.8 C), temperature source Oral, resp. rate 18, height 5\' 4"  (1.626 m), weight 83.3 kg (183 lb 10.3 oz), SpO2 96 %.  No intake or output data in the 24 hours ending 01/18/17 1321  Exam Awake Alert, Oriented x 3, No new F.N deficits, Normal affect [place].AT,PERRAL Supple Neck,No JVD, No cervical lymphadenopathy appriciated.   Symmetrical Chest wall movement, Good air movement bilaterally, CTAB RRR,No Gallops,Rubs or new Murmurs, No Parasternal Heave +ve B.Sounds, Abd Soft, Non tender, No organomegaly appriciated, No rebound -guarding or rigidity. No Cyanosis, Clubbing or edema, No new Rash or bruise  Data Review   CBC w Diff:  Lab Results  Component Value Date   WBC 9.6 01/16/2017   HGB 8.9 (L) 01/16/2017   HGB 11.7 (L) 12/09/2014   HCT 27.1 (L) 01/16/2017   HCT 29.7 (L) 04/13/2016   PLT 279 01/16/2017   PLT 177 04/13/2016   LYMPHOPCT 6 01/16/2017   LYMPHOPCT 12.2 12/09/2014   MONOPCT 5 01/16/2017   MONOPCT 4.6 12/09/2014   EOSPCT 3 01/16/2017   EOSPCT 3.1 12/09/2014   BASOPCT 1 01/16/2017   BASOPCT 1.0 12/09/2014    CMP:  Lab Results  Component Value Date   NA 136 01/13/2017   NA 142 04/13/2016   NA 134 (L) 12/09/2014   K 4.4 01/13/2017   K 4.5 12/09/2014   CL 96 (L) 01/13/2017   CL 105 12/09/2014   CO2 29 01/13/2017   CO2 23 12/09/2014   BUN 61 (H) 01/13/2017   BUN 38 (H) 04/13/2016   BUN 31 (H) 12/09/2014   CREATININE 2.52 (H) 01/13/2017   CREATININE 1.27 (H) 12/09/2014   PROT 5.6 (L) 03/18/2016   PROT 6.8 12/09/2014   ALBUMIN 3.9 09/20/2016   ALBUMIN 3.9 12/09/2014   BILITOT 1.1 03/18/2016   BILITOT 0.6 12/09/2014   ALKPHOS 47 03/18/2016   ALKPHOS 44 12/09/2014   AST 29 03/18/2016   AST 25 12/09/2014   ALT 69 (H) 03/18/2016   ALT 16 12/09/2014  . No change in meds.  Total Time in preparing paper work, data evaluation and todays exam - 81 minutes  Yuriy Cui M.D on 01/13/2017 at 1:21 PM    Note: This dictation was prepared with Dragon dictation along with  smaller phrase technology. Regina transcriptional errors that result from this process are unintentional.

## 2017-01-23 ENCOUNTER — Emergency Department: Payer: Medicare Other

## 2017-01-23 ENCOUNTER — Encounter: Payer: Self-pay | Admitting: Emergency Medicine

## 2017-01-23 ENCOUNTER — Inpatient Hospital Stay
Admission: EM | Admit: 2017-01-23 | Discharge: 2017-01-30 | DRG: 682 | Disposition: A | Discharge status: Home / Self Care | Payer: Medicare Other | Attending: Internal Medicine | Admitting: Internal Medicine

## 2017-01-23 DIAGNOSIS — I509 Heart failure, unspecified: Secondary | ICD-10-CM | POA: Diagnosis not present

## 2017-01-23 DIAGNOSIS — J440 Chronic obstructive pulmonary disease with acute lower respiratory infection: Secondary | ICD-10-CM | POA: Diagnosis present

## 2017-01-23 DIAGNOSIS — I5043 Acute on chronic combined systolic (congestive) and diastolic (congestive) heart failure: Secondary | ICD-10-CM | POA: Diagnosis present

## 2017-01-23 DIAGNOSIS — I340 Nonrheumatic mitral (valve) insufficiency: Secondary | ICD-10-CM | POA: Diagnosis not present

## 2017-01-23 DIAGNOSIS — K219 Gastro-esophageal reflux disease without esophagitis: Secondary | ICD-10-CM | POA: Diagnosis present

## 2017-01-23 DIAGNOSIS — Z85118 Personal history of other malignant neoplasm of bronchus and lung: Secondary | ICD-10-CM | POA: Diagnosis not present

## 2017-01-23 DIAGNOSIS — K3189 Other diseases of stomach and duodenum: Secondary | ICD-10-CM | POA: Diagnosis present

## 2017-01-23 DIAGNOSIS — R319 Hematuria, unspecified: Secondary | ICD-10-CM

## 2017-01-23 DIAGNOSIS — K922 Gastrointestinal hemorrhage, unspecified: Secondary | ICD-10-CM

## 2017-01-23 DIAGNOSIS — I482 Chronic atrial fibrillation: Secondary | ICD-10-CM | POA: Diagnosis present

## 2017-01-23 DIAGNOSIS — N179 Acute kidney failure, unspecified: Secondary | ICD-10-CM | POA: Diagnosis present

## 2017-01-23 DIAGNOSIS — I252 Old myocardial infarction: Secondary | ICD-10-CM

## 2017-01-23 DIAGNOSIS — Z66 Do not resuscitate: Secondary | ICD-10-CM | POA: Diagnosis present

## 2017-01-23 DIAGNOSIS — Z91040 Latex allergy status: Secondary | ICD-10-CM | POA: Diagnosis not present

## 2017-01-23 DIAGNOSIS — Z881 Allergy status to other antibiotic agents status: Secondary | ICD-10-CM

## 2017-01-23 DIAGNOSIS — Z888 Allergy status to other drugs, medicaments and biological substances status: Secondary | ICD-10-CM | POA: Diagnosis not present

## 2017-01-23 DIAGNOSIS — I130 Hypertensive heart and chronic kidney disease with heart failure and stage 1 through stage 4 chronic kidney disease, or unspecified chronic kidney disease: Secondary | ICD-10-CM | POA: Diagnosis present

## 2017-01-23 DIAGNOSIS — N189 Chronic kidney disease, unspecified: Secondary | ICD-10-CM

## 2017-01-23 DIAGNOSIS — I2510 Atherosclerotic heart disease of native coronary artery without angina pectoris: Secondary | ICD-10-CM | POA: Diagnosis present

## 2017-01-23 DIAGNOSIS — J189 Pneumonia, unspecified organism: Secondary | ICD-10-CM

## 2017-01-23 DIAGNOSIS — I4891 Unspecified atrial fibrillation: Secondary | ICD-10-CM | POA: Diagnosis not present

## 2017-01-23 DIAGNOSIS — Z923 Personal history of irradiation: Secondary | ICD-10-CM | POA: Diagnosis not present

## 2017-01-23 DIAGNOSIS — N289 Disorder of kidney and ureter, unspecified: Secondary | ICD-10-CM

## 2017-01-23 DIAGNOSIS — I5033 Acute on chronic diastolic (congestive) heart failure: Secondary | ICD-10-CM | POA: Diagnosis not present

## 2017-01-23 DIAGNOSIS — J9621 Acute and chronic respiratory failure with hypoxia: Secondary | ICD-10-CM | POA: Diagnosis present

## 2017-01-23 DIAGNOSIS — R0602 Shortness of breath: Secondary | ICD-10-CM

## 2017-01-23 DIAGNOSIS — N184 Chronic kidney disease, stage 4 (severe): Secondary | ICD-10-CM | POA: Diagnosis present

## 2017-01-23 DIAGNOSIS — R079 Chest pain, unspecified: Secondary | ICD-10-CM

## 2017-01-23 DIAGNOSIS — E875 Hyperkalemia: Secondary | ICD-10-CM | POA: Diagnosis present

## 2017-01-23 DIAGNOSIS — E1122 Type 2 diabetes mellitus with diabetic chronic kidney disease: Secondary | ICD-10-CM | POA: Diagnosis present

## 2017-01-23 DIAGNOSIS — D500 Iron deficiency anemia secondary to blood loss (chronic): Secondary | ICD-10-CM | POA: Diagnosis not present

## 2017-01-23 DIAGNOSIS — Z880 Allergy status to penicillin: Secondary | ICD-10-CM | POA: Diagnosis not present

## 2017-01-23 DIAGNOSIS — K921 Melena: Secondary | ICD-10-CM | POA: Diagnosis not present

## 2017-01-23 LAB — PROTIME-INR
INR: 1.48
Prothrombin Time: 18.1 seconds — ABNORMAL HIGH (ref 11.4–15.2)

## 2017-01-23 LAB — COMPREHENSIVE METABOLIC PANEL
ALK PHOS: 53 U/L (ref 38–126)
ALT: 14 U/L (ref 14–54)
AST: 26 U/L (ref 15–41)
Albumin: 3.8 g/dL (ref 3.5–5.0)
Anion gap: 14 (ref 5–15)
BILIRUBIN TOTAL: 1.8 mg/dL — AB (ref 0.3–1.2)
BUN: 85 mg/dL — AB (ref 6–20)
CALCIUM: 9.1 mg/dL (ref 8.9–10.3)
CO2: 20 mmol/L — ABNORMAL LOW (ref 22–32)
Chloride: 99 mmol/L — ABNORMAL LOW (ref 101–111)
Creatinine, Ser: 3.39 mg/dL — ABNORMAL HIGH (ref 0.44–1.00)
GFR calc Af Amer: 14 mL/min — ABNORMAL LOW (ref >60)
GFR calc non Af Amer: 12 mL/min — ABNORMAL LOW (ref >60)
GLUCOSE: 162 mg/dL — AB (ref 65–99)
POTASSIUM: 5.3 mmol/L — AB (ref 3.5–5.1)
Sodium: 133 mmol/L — ABNORMAL LOW (ref 135–145)
TOTAL PROTEIN: 7.6 g/dL (ref 6.5–8.1)

## 2017-01-23 LAB — LACTIC ACID, PLASMA: Lactic Acid, Venous: 1.4 mmol/L (ref 0.5–1.9)

## 2017-01-23 LAB — GLUCOSE, CAPILLARY
Glucose-Capillary: 141 mg/dL — ABNORMAL HIGH (ref 65–99)
Glucose-Capillary: 146 mg/dL — ABNORMAL HIGH (ref 65–99)

## 2017-01-23 LAB — CBC WITH DIFFERENTIAL/PLATELET
Basophils Absolute: 0.1 10*3/uL (ref 0–0.1)
Basophils Relative: 1 %
Eosinophils Absolute: 0 10*3/uL (ref 0–0.7)
Eosinophils Relative: 0 %
HEMATOCRIT: 26.8 % — AB (ref 35.0–47.0)
Hemoglobin: 8.5 g/dL — ABNORMAL LOW (ref 12.0–16.0)
LYMPHS ABS: 0.4 10*3/uL — AB (ref 1.0–3.6)
Lymphocytes Relative: 2 %
MCH: 26.2 pg (ref 26.0–34.0)
MCHC: 31.8 g/dL — AB (ref 32.0–36.0)
MCV: 82.5 fL (ref 80.0–100.0)
MONO ABS: 0.7 10*3/uL (ref 0.2–0.9)
MONOS PCT: 4 %
NEUTROS ABS: 17.7 10*3/uL — AB (ref 1.4–6.5)
Neutrophils Relative %: 93 %
Platelets: 309 10*3/uL (ref 150–440)
RBC: 3.25 MIL/uL — ABNORMAL LOW (ref 3.80–5.20)
RDW: 17.4 % — AB (ref 11.5–14.5)
WBC: 18.9 10*3/uL — ABNORMAL HIGH (ref 3.6–11.0)

## 2017-01-23 LAB — TROPONIN I: Troponin I: 0.04 ng/mL (ref <0.03)

## 2017-01-23 MED ORDER — ATORVASTATIN CALCIUM 20 MG PO TABS
20.0000 mg | ORAL_TABLET | Freq: Every evening | ORAL | Status: DC
  Administered 2017-01-23 – 2017-01-29 (×7): 20 mg via ORAL
  Filled 2017-01-23 (×7): qty 1

## 2017-01-23 MED ORDER — TRAMADOL HCL 50 MG PO TABS
50.0000 mg | ORAL_TABLET | Freq: Four times a day (QID) | ORAL | Status: DC | PRN
  Administered 2017-01-23 – 2017-01-24 (×2): 50 mg via ORAL
  Filled 2017-01-23 (×2): qty 1

## 2017-01-23 MED ORDER — SERTRALINE HCL 100 MG PO TABS
50.0000 mg | ORAL_TABLET | Freq: Every day | ORAL | Status: DC
  Administered 2017-01-23 – 2017-01-30 (×5): 50 mg via ORAL
  Filled 2017-01-23 (×5): qty 1

## 2017-01-23 MED ORDER — ONDANSETRON HCL 4 MG/2ML IJ SOLN: 4.0000 mg | Freq: Four times a day (QID) | INTRAMUSCULAR | Status: DC | PRN

## 2017-01-23 MED ORDER — ALPRAZOLAM 0.5 MG PO TABS
0.5000 mg | ORAL_TABLET | Freq: Every day | ORAL | Status: DC
  Administered 2017-01-23 – 2017-01-29 (×6): 0.5 mg via ORAL
  Filled 2017-01-23 (×6): qty 1

## 2017-01-23 MED ORDER — ONDANSETRON HCL 4 MG PO TABS
4.0000 mg | ORAL_TABLET | Freq: Four times a day (QID) | ORAL | Status: DC | PRN
Start: 2017-01-23 — End: 2017-01-30

## 2017-01-23 MED ORDER — VITAMIN B-12 1000 MCG PO TABS
500.0000 ug | ORAL_TABLET | Freq: Every day | ORAL | Status: DC
  Administered 2017-01-23 – 2017-01-30 (×7): 500 ug via ORAL
  Filled 2017-01-23 (×8): qty 1

## 2017-01-23 MED ORDER — INSULIN ASPART 100 UNIT/ML ~~LOC~~ SOLN
0.0000 [IU] | Freq: Three times a day (TID) | SUBCUTANEOUS | Status: DC
  Administered 2017-01-23: 1 [IU] via SUBCUTANEOUS
  Administered 2017-01-24: 2 [IU] via SUBCUTANEOUS
  Administered 2017-01-24: 1 [IU] via SUBCUTANEOUS
  Administered 2017-01-25: 2 [IU] via SUBCUTANEOUS
  Administered 2017-01-25 – 2017-01-26 (×3): 1 [IU] via SUBCUTANEOUS
  Administered 2017-01-27: 3 [IU] via SUBCUTANEOUS
  Administered 2017-01-28 – 2017-01-30 (×4): 1 [IU] via SUBCUTANEOUS
  Filled 2017-01-23: qty 3
  Filled 2017-01-23 (×5): qty 1
  Filled 2017-01-23: qty 2
  Filled 2017-01-23: qty 1
  Filled 2017-01-23: qty 2
  Filled 2017-01-23 (×2): qty 1

## 2017-01-23 MED ORDER — ALBUTEROL SULFATE (2.5 MG/3ML) 0.083% IN NEBU: 2.5000 mg | INHALATION_SOLUTION | Freq: Four times a day (QID) | RESPIRATORY_TRACT | Status: DC | PRN

## 2017-01-23 MED ORDER — SENNOSIDES-DOCUSATE SODIUM 8.6-50 MG PO TABS
1.0000 | ORAL_TABLET | Freq: Every evening | ORAL | Status: DC | PRN
  Administered 2017-01-28: 1 via ORAL
  Filled 2017-01-23: qty 1

## 2017-01-23 MED ORDER — FERROUS SULFATE 325 (65 FE) MG PO TABS
325.0000 mg | ORAL_TABLET | Freq: Every day | ORAL | Status: DC
  Administered 2017-01-24 – 2017-01-30 (×6): 325 mg via ORAL
  Filled 2017-01-23 (×6): qty 1

## 2017-01-23 MED ORDER — FLUTICASONE FUROATE-VILANTEROL 100-25 MCG/INH IN AEPB
1.0000 | INHALATION_SPRAY | Freq: Every day | RESPIRATORY_TRACT | Status: DC | PRN
  Filled 2017-01-23: qty 28

## 2017-01-23 MED ORDER — OCUVITE-LUTEIN PO CAPS
1.0000 | ORAL_CAPSULE | Freq: Every day | ORAL | Status: DC
  Administered 2017-01-23 – 2017-01-30 (×6): 1 via ORAL
  Filled 2017-01-23 (×6): qty 1

## 2017-01-23 MED ORDER — CHOLECALCIFEROL 10 MCG (400 UNIT) PO TABS
400.0000 [IU] | ORAL_TABLET | Freq: Every day | ORAL | Status: DC
  Administered 2017-01-23 – 2017-01-30 (×7): 400 [IU] via ORAL
  Filled 2017-01-23 (×7): qty 1

## 2017-01-23 MED ORDER — FENOFIBRATE 54 MG PO TABS
54.0000 mg | ORAL_TABLET | Freq: Every day | ORAL | Status: DC
  Administered 2017-01-23 – 2017-01-30 (×7): 54 mg via ORAL
  Filled 2017-01-23 (×9): qty 1

## 2017-01-23 MED ORDER — NITROGLYCERIN 0.4 MG SL SUBL: 0.4000 mg | SUBLINGUAL_TABLET | SUBLINGUAL | Status: DC | PRN

## 2017-01-23 MED ORDER — ACETAMINOPHEN 650 MG RE SUPP: 650.0000 mg | Freq: Four times a day (QID) | RECTAL | Status: DC | PRN

## 2017-01-23 MED ORDER — PANTOPRAZOLE SODIUM 40 MG PO TBEC
40.0000 mg | DELAYED_RELEASE_TABLET | Freq: Two times a day (BID) | ORAL | Status: DC
  Administered 2017-01-23 – 2017-01-30 (×13): 40 mg via ORAL
  Filled 2017-01-23 (×13): qty 1

## 2017-01-23 MED ORDER — METOPROLOL SUCCINATE ER 25 MG PO TB24
12.5000 mg | ORAL_TABLET | Freq: Every day | ORAL | Status: DC
  Administered 2017-01-23 – 2017-01-24 (×2): 12.5 mg via ORAL
  Filled 2017-01-23 (×2): qty 1

## 2017-01-23 MED ORDER — TRAMADOL HCL 50 MG PO TABS: 50.0000 mg | ORAL_TABLET | Freq: Four times a day (QID) | ORAL | Status: DC | PRN

## 2017-01-23 MED ORDER — TIOTROPIUM BROMIDE MONOHYDRATE 18 MCG IN CAPS
18.0000 ug | ORAL_CAPSULE | Freq: Every day | RESPIRATORY_TRACT | Status: DC
  Administered 2017-01-23 – 2017-01-30 (×7): 18 ug via RESPIRATORY_TRACT
  Filled 2017-01-23 (×2): qty 5

## 2017-01-23 MED ORDER — ACETAMINOPHEN 325 MG PO TABS
650.0000 mg | ORAL_TABLET | Freq: Four times a day (QID) | ORAL | Status: DC | PRN
Start: 2017-01-23 — End: 2017-01-30
  Administered 2017-01-30 (×2): 650 mg via ORAL
  Filled 2017-01-23 (×2): qty 2

## 2017-01-23 NOTE — Progress Notes (Signed)
   01/23/17 1900  Clinical Encounter Type  Visited With Patient  Visit Type Initial  Referral From Nurse  Consult/Referral To Chaplain  Stress Factors  Patient Stress Factors Family relationships  Family Stress Factors Family relationships  Advance Directives (For Healthcare)  Does Patient Have a Medical Advance Directive? Yes  Does patient want to make changes to medical advance directive? No - Patient declined  Patient wanted to process some relationship issues with the chaplain. Patient was able to share her thoughts and feelings about difficulties in relating to two of her children. Patient thanked chaplain several times for listening to her. No other stressors or issues presented by patient at this time. Ashley Jacobs [phone]

## 2017-01-23 NOTE — ED Notes (Signed)
MD at bedside to attempt IV via ultrasound.

## 2017-01-23 NOTE — ED Notes (Signed)
Date and time results received: 01/23/17 1406   Test: Trop Critical Value: 0.04  Name of Provider Notified: Dr. Kerman Passey  Orders Received? Or Actions Taken?: No new orders received.

## 2017-01-23 NOTE — Progress Notes (Signed)
Pharmacy Note  83 y/o F admitted with SOB. Code sepsis called in triage. Patient does not appear to meet the requirements for code sepsis, but the code has not yet been rescinded by MD. No abx ordered or planned as of yet per RN.   Vitals:   01/23/17 1134  BP: (!) 123/39  Pulse: 71  Resp: 20  Temp: 98.4 F (36.9 C)    Ulice Dash, PharmD Clinical Pharmacist

## 2017-01-23 NOTE — ED Triage Notes (Signed)
Pt sent here by PCP for WBC 22. Pt reports cough and chest congestion.

## 2017-01-23 NOTE — ED Notes (Signed)
This RN to bedside, offered to assist patient to the bathroom, pt refused. TV turned on for patient comfort. Delay explained and apologized for.

## 2017-01-23 NOTE — ED Notes (Signed)
bc drawn from right wrist

## 2017-01-23 NOTE — Progress Notes (Signed)
Family Meeting Note  Advance Directive:yes  Today a meeting took place with the emergency room  The following clinical team members were present during this meeting: Patient and her son who is the healthcare power of attorney The following were discussed:Patient's diagnosis: Patient has being admitted for acute on chronic hypoxic respiratory failure, acute on chronic systolic diastolic heart failure and chronic kidney disease stage IV with worsening creatinine and worsening shortness of breath. She has history of significant anemia and continue history of chronic medical issues. She is currently stable however has a poor long-term prognosis   Discussed CODE STATUS with patient and son, they wish to be DO NOT RESUSCITATE  Time spent during discussion 23 mins  Austine Wiedeman, MD

## 2017-01-23 NOTE — ED Notes (Signed)
This RN attempted multiple times to start an IV, 22 in R hand obtained, Erin, Medic Student attempted multiple times to start IV without success.

## 2017-01-23 NOTE — H&P (Signed)
[facility] NAME: Regina Burke    MR#:  [id number]  DATE OF BIRTH:  1933/04/11  DATE OF ADMISSION:  01/23/2017  PRIMARY CARE PHYSICIAN: Tracie Harrier, MD   REQUESTING/REFERRING PHYSICIAN: Dr Vicente Males  CHIEF COMPLAINT:  Increasing shortness of breath for 2 weeks. Patient's son is present in the emergency room  HISTORY OF PRESENT ILLNESS:  Regina Burke  is a 83 y.o. female with a known history of Chronic diastolic congestive heart failure, chronic COPD on 2 L nasal cannula oxygen, history of A. fib on Eliquis that was discontinued about 10 days ago by Dr. Candis Musa, chronic kidney disease stage IV Comes to the emergency room with increasing shortness of breath, fatigability, orthopnea. Patient reports She gets tired very easily walking around the house getting more shortness of breath than her usual and has some nausea. Her by mouth intake has been poor.  In the ER she was found to white count of 16,000. No fever. Creatinine up to 3.59 (baseline around 2) Chest x-ray shows increased left base pleural effusion. Patient is being admitted for acute on chronic hypoxic respiratory failure secondary to bilateral pleural effusion more on the left than right, acute on chronic diastolic congestive heart failure and acute on chronic CKD stage IV  PAST MEDICAL HISTORY:   Past Medical History:  Diagnosis Date  . Acute on chronic combined systolic and diastolic CHF (congestive heart failure) ([place])    a. 10/2015 Echo: EF 55-65%, Gr1 DD, mild MR, mildly dil LA, nl RV fxn, nl PASP;  b. 03/2016 Echo: EF 30-35%, mod MR, mod dil LA.  [place] Anemia   . Arthritis   . Asthma   . Cardiac resynchronization therapy pacemaker (CRT-P) in place    a. 03/31/16:  Medtronic Percepta Quad CRT-P MRI SureScan (serial Number [id number]) device.  . CKD (chronic kidney disease), stage IV ([place])   . COPD (chronic obstructive pulmonary disease) ([place])   . Coronary  artery disease    a. 11/2014 NSTEMI/PCI: LM nl, LAD 59m, D1 30, LCX mild dzs, OM1 20p, OM2 45m, OM3 90p (2.25x8 Promus Premier DES), RCA nl.   . Cough    CHRONIC AT NIGHT  . Cushing's disease ([place])   . Depression   . Diverticulitis   . Edema    FEET/LEGS  . GERD (gastroesophageal reflux disease)   . Gout   . History of hiatal hernia   . History of pneumonia   . HLD (hyperlipidemia)   . HOH (hard of hearing)   . Hypertensive heart disease   . Lung cancer ([place]) dx'd 2014   S/P radiation 2015  . Migraine   . 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 03/2016 Echo: EF 30-35%.  . Moderate mitral regurgitation   . Multiple allergies   . Myocardial infarction ([place])   . Oxygen deficiency    2LITERS  . Persistent atrial fibrillation (H[facility])    a. on Eliquis 2.5 mg bid (age & SCr); b. CHADS2VASc ==> 7 (CHF, HTN, age x 2, DM, vascular disease and sex category);  c. 03/2016 s/p AVN and MDT BiV ICD placement.  . Presence of permanent cardiac pacemaker   . S/P AV nodal ablation    a. on 03/31/16 for persistant afib with CRT-P placement  . Sleep apnea   . Type II diabetes mellitus ([place])     PAST SURGICAL HISTOIRY:   Past Surgical History:  Procedure Laterality Date  .  ABDOMINAL HYSTERECTOMY    . ABLATION     July 2017  . ADRENALECTOMY Left 1980's   "[place]"  . APPENDECTOMY    . BREAST CYST EXCISION Left   . CATARACT EXTRACTION W/PHACO Right 12/28/2015   Procedure: CATARACT EXTRACTION PHACO AND INTRAOCULAR LENS PLACEMENT (IOC);  Surgeon: Birder Robson, MD;  Location: [facility] ORS;  Service: Ophthalmology;  Laterality: Right;  [place] 48.4   . CATARACT EXTRACTION W/PHACO Left 11/14/2016   Procedure: CATARACT EXTRACTION PHACO AND INTRAOCULAR LENS PLACEMENT (IOC);  Surgeon: Birder Robson, MD;  Location: [facility] ORS;  Service: Ophthalmology;  Laterality: Left;  [place] 59.8 AP% 18.1 CDE 10.78 Fluid pack lot # [id number]  . CHOLECYSTECTOMY    . CORONARY ANGIOPLASTY WITH STENT PLACEMENT   12/23/2014  . ELECTROPHYSIOLOGIC STUDY N/A 03/08/2016   Procedure: CARDIOVERSION;  Surgeon: Wende Bushy, MD;  Location: [facility] ORS;  Service: Cardiovascular;  Laterality: N/A;  . ELECTROPHYSIOLOGIC STUDY N/A 03/07/2016   Procedure: Cardioversion;  Surgeon: Wende Bushy, MD;  Location: [facility] ORS;  Service: Cardiovascular;  Laterality: N/A;  . ELECTROPHYSIOLOGIC STUDY N/A 03/31/2016   Procedure: AV Node Ablation;  Surgeon: Will Meredith Leeds, MD;  Location: [place] [facility];  Service: Cardiovascular;  Laterality: N/A;  . EP IMPLANTABLE DEVICE N/A 03/31/2016   Procedure: BiV Pacemaker Insertion CRT-P;  Surgeon: Will Meredith Leeds, MD;  Location: [place] [facility];  Service: Cardiovascular;  Laterality: N/A;  . FRACTURE SURGERY    . INSERT / REPLACE / REMOVE PACEMAKER  02/2016  . LEFT HEART CATHETERIZATION WITH CORONARY ANGIOGRAM N/A 12/23/2014   Procedure: LEFT HEART CATHETERIZATION WITH CORONARY ANGIOGRAM;  Surgeon: Burnell Blanks, MD;  Location[facility] CATH LAB;  Service: Cardiovascular;  Laterality: N/A;  . PERCUTANEOUS CORONARY STENT INTERVENTION (PCI-S)  12/23/2014   Procedure: PERCUTANEOUS CORONARY STENT INTERVENTION (PCI-S);  Surgeon: Burnell Blanks, MD;  Location: [facility] CATH LAB;  Service: Cardiovascular;;  Promus 2.25x8  . TONSILLECTOMY    . TRANSTHORACIC ECHOCARDIOGRAM  11/26/2015   Technically difficult study. EF 55-60%. Normal wall motion. GR 1 DD.  . TUBAL LIGATION    . WRIST FRACTURE SURGERY Bilateral ~ 2000    SOCIAL HISTORY:   Social History  Substance Use Topics  . Smoking status: Former Smoker    Packs/day: 1.00    Years: 45.00    Types: Cigarettes    Quit date: 04/25/1994  . Smokeless tobacco: Never Used  . Alcohol use No     Comment: 12/23/2014 "might have a couple mixed drinks/year"    FAMILY HISTORY:   Family History  Problem Relation Age of Onset  . Heart disease Mother   . Diabetes Mother   . Osteoarthritis Mother   . Hypertension Mother   . Heart  disease Father   . Hypertension Father   . COPD Brother     DRUG ALLERGIES:   Allergies  Allergen Reactions  . Ciprofloxacin Shortness Of Breath, Itching and Rash  . Doxycycline Shortness Of Breath, Itching and Rash  . Penicillins Shortness Of Breath, Itching, Rash and Other (See Comments)    Has patient had a PCN reaction causing immediate rash, facial/tongue/throat swelling, SOB or lightheadedness with hypotension: Yes Has patient had a PCN reaction causing severe rash involving mucus membranes or skin necrosis: No Has patient had a PCN reaction that required hospitalization No Has patient had a PCN reaction occurring within the last 10 years: No If all of the above answers are "NO", then may proceed with Cephalosporin use.  . Sulfa Antibiotics  Shortness Of Breath, Itching and Rash  . Latex Itching  . Morphine And Related Itching  . Albuterol Sulfate   . Cefuroxime Rash    Blisters in mouth    REVIEW OF SYSTEMS:  Review of Systems  Constitutional: Negative for chills, fever and weight loss.  HENT: Negative for ear discharge, ear pain and nosebleeds.   Eyes: Negative for blurred vision, pain and discharge.  Respiratory: Positive for cough and shortness of breath. Negative for sputum production, wheezing and stridor.   Cardiovascular: Positive for orthopnea. Negative for chest pain, palpitations and PND.  Gastrointestinal: Negative for abdominal pain, diarrhea, nausea and vomiting.  Genitourinary: Negative for frequency and urgency.  Musculoskeletal: Negative for back pain and joint pain.  Neurological: Positive for weakness. Negative for sensory change, speech change and focal weakness.  Psychiatric/Behavioral: Negative for depression and hallucinations. The patient is not nervous/anxious.      MEDICATIONS AT HOME:   Prior to Admission medications   Medication Sig Start Date End Date Taking? Authorizing Provider  ALPRAZolam Duanne Moron) 0.5 MG tablet Take 0.5 mg by mouth at  bedtime. 12/27/16  Yes [provider]  atorvastatin (LIPITOR) 20 MG tablet Take 1 tablet (20 mg total) by mouth every evening. 12/03/15  Yes Gollan, Kathlene November, MD  cholecalciferol (VITAMIN D) 400 UNITS TABS tablet Take 400 Units by mouth daily.    Yes [provider]  cyanocobalamin 500 MCG tablet Take 500 mcg by mouth daily.    Yes [provider]  fenofibrate (TRICOR) 48 MG tablet Take 48 mg by mouth every evening.    Yes [provider]  ferrous sulfate 325 (65 FE) MG EC tablet Take 325 mg by mouth daily with breakfast.    Yes [provider]  fluticasone furoate-vilanterol (BREO ELLIPTA) 100-25 MCG/INH AEPB Inhale 1 puff into the lungs daily as needed.    Yes [provider]  furosemide (LASIX) 40 MG tablet Take 1 tablet (40 mg total) by mouth daily. 04/01/16  Yes Eileen Stanford, PA-C  metoprolol succinate (TOPROL XL) 25 MG 24 hr tablet Take 0.5 tablets (12.5 mg total) by mouth daily. 12/19/16  Yes Minna Merritts, MD  Multiple Vitamins-Minerals (PRESERVISION AREDS 2) CAPS Take 1 capsule by mouth daily.   Yes [provider]  nitroGLYCERIN (NITROSTAT) 0.4 MG SL tablet Place 1 tablet (0.4 mg total) under the tongue every 5 (five) minutes as needed for chest pain. 12/24/14  Yes Barrett, Evelene Croon, PA-C  pantoprazole (PROTONIX) 40 MG tablet Take 40 mg by mouth every evening.    Yes [provider]  sertraline (ZOLOFT) 50 MG tablet Take 1 tablet (50 mg total) by mouth daily. 03/24/16  Yes Gladstone Lighter, MD  tiotropium (SPIRIVA) 18 MCG inhalation capsule Place 18 mcg into inhaler and inhale daily as needed.    Yes [provider]  traMADol (ULTRAM) 50 MG tablet Take 50 mg by mouth every 6 (six) hours as needed.   Yes [provider]      VITAL SIGNS:  Blood pressure (!) 123/39, pulse 71, temperature 98.4 F (36.9 C), temperature source Oral, resp. rate 20, height 5\' 4"  (1.626 m), weight 83.9 kg (185 lb),  SpO2 91 %.  PHYSICAL EXAMINATION:  GENERAL:  83 y.o.-year-old patient lying in the bed with no acute distress.  EYES: Pupils equal, round, reactive to light and accommodation. No scleral icterus. Extraocular muscles intact.  HEENT: Head atraumatic, normocephalic. Oropharynx and nasopharynx clear.  NECK:  Supple, no jugular  venous distention. No thyroid enlargement, no tenderness.  LUNGS:decreased breath sounds bilaterally, no wheezing, rales,rhonchi or crepitation. No use of accessory muscles of respiration.  CARDIOVASCULAR: S1, S2 normal. No murmurs, rubs, or gallops.  ABDOMEN: Soft, nontender, nondistended. Bowel sounds present. No organomegaly or mass.  EXTREMITIES: No pedal edema, cyanosis, or clubbing.  NEUROLOGIC: Cranial nerves II through XII are intact. Muscle strength 5/5 in all extremities. Sensation intact. Gait not checked.  PSYCHIATRIC: The patient is alert and oriented x 3.  SKIN: No obvious rash, lesion, or ulcer.   LABORATORY PANEL:   CBC  Recent Labs Lab 01/23/17 1121  WBC 18.9*  HGB 8.5*  HCT 26.8*  PLT 309   ------------------------------------------------------------------------------------------------------------------  Chemistries   Recent Labs Lab 01/23/17 1121  NA 133*  K 5.3*  CL 99*  CO2 20*  GLUCOSE 162*  BUN 85*  CREATININE 3.39*  CALCIUM 9.1  AST 26  ALT 14  ALKPHOS 53  BILITOT 1.8*   ------------------------------------------------------------------------------------------------------------------  Cardiac Enzymes  Recent Labs Lab 01/23/17 1121  TROPONINI 0.04*   ------------------------------------------------------------------------------------------------------------------  RADIOLOGY:  Dg Chest 2 View  Result Date: 01/23/2017 CLINICAL DATA:  Cough and chest congestion. History of asthma -COPD, coronary artery disease, lung malignancy, elevated white blood cell count currently. EXAM: CHEST  2 VIEW COMPARISON:  PA and lateral  chest x-ray of Jan 12, 2017 FINDINGS: The right lung is well-expanded. A stable tiny pleural effusion is observed. There is stable density in the right apex radiating toward the right hilum. There are stable surgical clips here. On the left there is increased pleural fluid at the lung base. The cardiac silhouette is enlarged. The pulmonary vascularity is not clearly engorged. The ICD is in stable position. There is calcification in the wall of the aortic arch. The observed bony thorax exhibits no acute abnormality. IMPRESSION: Increased volume of pleural fluid on the left. Stable tiny right pleural effusion. Stable density in the right pulmonary apex. Stable cardiomegaly without pulmonary vascular congestion. No acute pneumonia is observed. Thoracic aortic atherosclerosis. Electronically Signed   By: David  Martinique M.D.   On: 01/23/2017 14:05    EKG:  A. fib rate controlled  IMPRESSION AND PLAN:  Regina Burke  is a 83 y.o. female with a known history of Chronic diastolic congestive heart failure, chronic COPD on 2 L nasal cannula oxygen, history of A. fib on Eliquis that was discontinued about 10 days ago by Dr. Candis Musa, chronic kidney disease stage IV Comes to the emergency room with increasing shortness of breath, fatigability, orthopnea.  1. Acute on chronic hypoxic respiratory failure secondary to CHF and acute on chronic mild exacerbation with bilateral pleural effusion more on the Route left and right -Admit to telemetry -Continue cardiac meds -Cardiology consultation with [facility] -IV Lasix 20 twice a day. Monitor I's and O's. Monitor creatinine closely  2. Acute on chronic kidney disease stage IV -Baseline creatinine 2.0. -Keeping with creatinine of 3.59 -Avoid nephrotoxins -Nephrology consulted  3. History of peptic ulcer disease with endoscopy done about 10 years ago and suspected GI bleed with history of anemia of chronic disease -Hemoglobin stable at 8.5. -Baseline hemoglobin around  8 -Patient takes iron pills -Her oral anticoagulation was discontinued by cardiology as outpatient this was done recently about 10 days ago -Continue monitor hemoglobin, PPI twice a day, transfuse as needed -Consider GI consultation if needed. Patient sees Dr. Vira Agar  4. Leukocytosisof infection identified. UA still pending. I'll hold off on antibiotic for now  5. Mild hyperkalemia  secondary to renal failure  6. DVT prophylaxis SCD in the setting of anemia and known history of GI bleed  Above was discussed with patient and son     All the records are reviewed and case discussed with ED provider. Management plans discussed with the patient, family and they are in agreement.  CODE STATUS: DO NOT RESUSCITATE per patient  TOTAL TIME TAKING CARE OF THIS PATIENT: 50  minutes.    Ebb Carelock M.D on 01/23/2017 at 4:18 PM  Between 7am to 6pm - Pager - [phone]  After 6pm go to [email or web address] - password [email or web address] [facility]  [facility]  Office  [phone]  CC: Primary care physician; Tracie Harrier, MD

## 2017-01-23 NOTE — ED Notes (Signed)
NAD noted at this time. Pt visualized resting in bed. Pt repositioned per request. Will continue to monitor for further patient needs.

## 2017-01-23 NOTE — ED Provider Notes (Signed)
[facility] Emergency Department Provider Note  Time seen: 12:06 PM  I have reviewed the triage vital signs and the nursing notes.   HISTORY  Chief Complaint Shortness of Breath    HPI Regina Burke is a 83 y.o. female with a past medical history of CHF, anemia, arthritis, CK D, COPD, diabetes, hypertension, on 2 L O2 24/7, presents to the emergency department for an abnormal lab. According to the patient for the past 2 weeks she has been feeling short of breath with occasional right-sided chest discomfort. Patient states for the past one month she has been having intermittent rectal bleeding, she was referred to GI medicine, which she followed up with today. She states since being taken off of her Eliquis 10 days ago, her GI bleeding has stopped. While being seen by GI medicine they checked labs showing a white blood cell count of 22,000 and referred to the emergency department. Patient states mild right-sided chest discomfort worse when lying down. Mild to moderate difficulty breathing worse with exertion. States 1-2 weeks of cough with clear sputum production. Denies any known fever.  Past Medical History:  Diagnosis Date  . Acute on chronic combined systolic and diastolic CHF (congestive heart failure) ([place])    a. 10/2015 Echo: EF 55-65%, Gr1 DD, mild MR, mildly dil LA, nl RV fxn, nl PASP;  b. 03/2016 Echo: EF 30-35%, mod MR, mod dil LA.  Marland Kitchen Anemia   . Arthritis   . Asthma   . Cardiac resynchronization therapy pacemaker (CRT-P) in place    a. 03/31/16:  Medtronic Percepta Quad CRT-P MRI SureScan (serial Number [id number]) device.  . CKD (chronic kidney disease), stage IV ([place])   . COPD (chronic obstructive pulmonary disease) ([place])   . Coronary artery disease    a. 11/2014 NSTEMI/PCI: LM nl, LAD 23m, D1 30, LCX mild dzs, OM1 20p, OM2 81m, OM3 90p (2.25x8 Promus Premier DES), RCA nl.   . Cough    CHRONIC AT NIGHT  . Cushing's disease ([place])   . Depression   .  Diverticulitis   . Edema    FEET/LEGS  . GERD (gastroesophageal reflux disease)   . Gout   . History of hiatal hernia   . History of pneumonia   . HLD (hyperlipidemia)   . HOH (hard of hearing)   . Hypertensive heart disease   . Lung cancer ([place]) dx'd 2014   S/P radiation 2015  . Migraine   . Mixed Ischemic and Nonischemic Cardiomyopathy ([place])    a. 03/2016 Echo: EF 30-35%.  . Moderate mitral regurgitation   . Multiple allergies   . Myocardial infarction ([place])   . Oxygen deficiency    2LITERS  . Persistent atrial fibrillation (HCC)    a. on Eliquis 2.5 mg bid (age & SCr); b. CHADS2VASc ==> 7 (CHF, HTN, age x 2, DM, vascular disease and sex category);  c. 03/2016 s/p AVN and MDT BiV ICD placement.  . Presence of permanent cardiac pacemaker   . S/P AV nodal ablation    a. on 03/31/16 for persistant afib with CRT-P placement  . Sleep apnea   . Type II diabetes mellitus [facility])     Patient Active Problem List   Diagnosis Date Noted  . Chest pain 01/12/2017  . Dizziness 08/25/2016  . LBBB (left bundle branch block) 04/01/2016  . S/P AV nodal ablation   . Cardiac resynchronization therapy pacemaker (CRT-P) in place   . Persistent atrial fibrillation ([place]): CHA2DS2-Vasc = ~  7. On Xarelto 15 mg (age & renal Fxn) 03/18/2016  . Congestive dilated cardiomyopathy ([place]) 03/18/2016    Class: Temporary  . DNR (do not resuscitate) discussion   . Palliative care encounter   . Persistent atrial fibrillation ([place])   . Chronic diastolic CHF (congestive heart failure) ([place]) 02/16/2016  . HLD (hyperlipidemia) 02/16/2016  . Iron deficiency anemia due to chronic blood loss   . Essential hypertension   . Dyspnea 11/25/2015  . Diaphoresis   . Renal insufficiency   . Centrilobular emphysema ([place])   . Chronic obstructive pulmonary disease ([place]) 07/14/2015  . Status post thoracentesis   . S/P thoracentesis   . H/O: lung cancer   . Pleural effusion   . Coronary artery disease involving native  coronary artery of native heart without angina pectoris   . S/P coronary artery stent placement   . Pressure ulcer 06/08/2015  . Dehydration 05/09/2015  . Diabetes ([place]) 05/09/2015  . Closed fracture nasal bone 05/09/2015  . Cancer of upper lobe of right lung ([place]) 01/08/2015  . Allergic state 01/08/2015  . Anemia associated with chronic renal failure 01/08/2015  . Chronic kidney disease 01/08/2015  . CAFL (chronic airflow limitation) ([place]) 01/08/2015  . Arthritis, degenerative 01/08/2015  . 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 Diabetes mellitus, type 2 ([employer]) 09/11/2014  . Type 2 diabetes mellitus ([place]) 09/11/2014    Past Surgical History:  Procedure Laterality Date  . ABDOMINAL HYSTERECTOMY    . ABLATION     July 2017  . ADRENALECTOMY Left 1980's   "[place]"  . APPENDECTOMY    . BREAST CYST EXCISION Left   . CATARACT EXTRACTION W/PHACO Right 12/28/2015   Procedure: CATARACT EXTRACTION PHACO AND INTRAOCULAR LENS PLACEMENT (IOC);  Surgeon: Birder Robson, MD;  Location: [facility] ORS;  Service: Ophthalmology;  Laterality: Right;  [place] 48.4   . CATARACT EXTRACTION W/PHACO Left 11/14/2016   Procedure: CATARACT EXTRACTION PHACO AND INTRAOCULAR LENS PLACEMENT (IOC);  Surgeon: Birder Robson, MD;  Location: [facility] ORS;  Service: Ophthalmology;  Laterality: Left;  [place] 59.8 AP% 18.1 CDE 10.78 Fluid pack lot # [id number]  . CHOLECYSTECTOMY    . CORONARY ANGIOPLASTY WITH STENT PLACEMENT  12/23/2014  . ELECTROPHYSIOLOGIC STUDY N/A 03/08/2016   Procedure: CARDIOVERSION;  Surgeon: Wende Bushy, MD;  Location: [facility] ORS;  Service: Cardiovascular;  Laterality: N/A;  . ELECTROPHYSIOLOGIC STUDY N/A 03/07/2016   Procedure: Cardioversion;  Surgeon: Wende Bushy, MD;  Location: [facility] ORS;  Service: Cardiovascular;  Laterality: N/A;  . ELECTROPHYSIOLOGIC STUDY N/A 03/31/2016   Procedure: AV Node Ablation;  Surgeon: Will Meredith Leeds, MD;  Location: [facility] LAB;  Service: Cardiovascular;   Laterality: N/A;  . EP IMPLANTABLE DEVICE N/A 03/31/2016   Procedure: BiV Pacemaker Insertion CRT-P;  Surgeon: Will Meredith Leeds, MD;  Location: [facility];  Service: Cardiovascular;  Laterality: N/A;  . FRACTURE SURGERY    . INSERT / REPLACE / REMOVE PACEMAKER  02/2016  . LEFT HEART CATHETERIZATION WITH CORONARY ANGIOGRAM N/A 12/23/2014   Procedure: LEFT HEART CATHETERIZATION WITH CORONARY ANGIOGRAM;  Surgeon: Burnell Blanks, MD;  Location: [facility] CATH LAB;  Service: Cardiovascular;  Laterality: N/A;  . PERCUTANEOUS CORONARY STENT INTERVENTION (PCI-S)  12/23/2014   Procedure: PERCUTANEOUS CORONARY STENT INTERVENTION (PCI-S);  Surgeon: Burnell Blanks, MD;  Location: [facility] CATH LAB;  Service: Cardiovascular;;  Promus 2.25x8  . TONSILLECTOMY    . TRANSTHORACIC ECHOCARDIOGRAM  11/26/2015   Technically difficult study. EF 55-60%. Normal wall motion. GR 1 DD.  Marland Kitchen  TUBAL LIGATION    . WRIST FRACTURE SURGERY Bilateral ~ 2000    Prior to Admission medications   Medication Sig Start Date End Date Taking? Authorizing Provider  ALPRAZolam Duanne Moron) 0.5 MG tablet Take 0.5 mg by mouth at bedtime. 12/27/16   [provider]  apixaban (ELIQUIS) 2.5 MG TABS tablet Take 1 tablet (2.5 mg total) by mouth every 12 (twelve) hours. ON HOLD 01/17/17 01/17/17   Cammie Sickle, MD  atorvastatin (LIPITOR) 20 MG tablet Take 1 tablet (20 mg total) by mouth every evening. 12/03/15   Minna Merritts, MD  cholecalciferol (VITAMIN D) 400 UNITS TABS tablet Take 400 Units by mouth every evening.     [provider]  cyanocobalamin 500 MCG tablet Take 500 mcg by mouth every evening.    [provider]  fenofibrate (TRICOR) 48 MG tablet Take 48 mg by mouth every evening.     [provider]  ferrous sulfate 325 (65 FE) MG EC tablet Take 325 mg by mouth every evening.     [provider]  fluticasone furoate-vilanterol (BREO ELLIPTA) 100-25 MCG/INH AEPB Inhale 1 puff  into the lungs daily.    [provider]  furosemide (LASIX) 40 MG tablet Take 1 tablet (40 mg total) by mouth daily. Patient taking differently: Take 20 mg by mouth daily.  04/01/16   Eileen Stanford, PA-C  HYDROcodone-acetaminophen (NORCO) 5-325 MG tablet Take 1 tablet by mouth every 6 (six) hours as needed for moderate pain. 12/08/16   Duanne Guess, PA-C  metoprolol succinate (TOPROL XL) 25 MG 24 hr tablet Take 0.5 tablets (12.5 mg total) by mouth daily. 12/19/16   Minna Merritts, MD  Multiple Vitamins-Minerals (PRESERVISION AREDS 2) CAPS Take 1 capsule by mouth daily.    [provider]  nitroGLYCERIN (NITROSTAT) 0.4 MG SL tablet Place 1 tablet (0.4 mg total) under the tongue every 5 (five) minutes as needed for chest pain. 12/24/14   Barrett, Evelene Croon, PA-C  pantoprazole (PROTONIX) 40 MG tablet Take 40 mg by mouth every evening.     [provider]  sertraline (ZOLOFT) 50 MG tablet Take 1 tablet (50 mg total) by mouth daily. 03/24/16   Gladstone Lighter, MD  tiotropium (SPIRIVA) 18 MCG inhalation capsule Place 18 mcg into inhaler and inhale daily.    [provider]    Allergies  Allergen Reactions  . Ciprofloxacin Shortness Of Breath, Itching and Rash  . Doxycycline Shortness Of Breath, Itching and Rash  . Penicillins Shortness Of Breath, Itching, Rash and Other (See Comments)    Has patient had a PCN reaction causing immediate rash, facial/tongue/throat swelling, SOB or lightheadedness with hypotension: Yes Has patient had a PCN reaction causing severe rash involving mucus membranes or skin necrosis: No Has patient had a PCN reaction that required hospitalization No Has patient had a PCN reaction occurring within the last 10 years: No If all of the above answers are "NO", then may proceed with Cephalosporin use.  . Sulfa Antibiotics Shortness Of Breath, Itching and Rash  . Latex Itching  . Morphine And Related Itching  . Albuterol Sulfate   .  Cefuroxime Rash    Blisters in mouth    Family History  Problem Relation Age of Onset  . Heart disease Mother   . Diabetes Mother   . Osteoarthritis Mother   . Hypertension Mother   . Heart disease Father   . Hypertension Father   . COPD Brother  Social History Social History  Substance Use Topics  . Smoking status: Former Smoker    Packs/day: 1.00    Years: 45.00    Types: Cigarettes    Quit date: 04/25/1994  . Smokeless tobacco: Never Used  . Alcohol use No     Comment: 12/23/2014 "might have a couple mixed drinks/year"    Review of Systems Constitutional: Negative for fever. ENT: Negative for congestion Cardiovascular: Intermittent right-sided chest pain. Respiratory: Positive for shortness of breath Gastrointestinal: Negative for abdominal pain Genitourinary: Negative for dysuria. Musculoskeletal: Negative for back pain. Skin: Negative for rash. Neurological: Negative for headache All other ROS negative  ____________________________________________   PHYSICAL EXAM:  VITAL SIGNS: ED Triage Vitals [01/23/17 1134]  Enc Vitals Group     BP (!) 123/39     Pulse Rate 71     Resp 20     Temp 98.4 F (36.9 C)     Temp Source Oral     SpO2 91 %     Weight 185 lb (83.9 kg)     Height 5\' 4"  (1.626 m)     Head Circumference      Peak Flow      Pain Score 0     Pain Loc      Pain Edu?      Excl. in [place]?     Constitutional: Alert and oriented. Well appearing and in no distress. Eyes: Normal exam ENT   Head: Normocephalic and atraumatic   Mouth/Throat: Mucous membranes are moist. Cardiovascular: Normal rate, regular rhythm. No murmur Respiratory: Normal respiratory effort without tachypnea nor retractions. Breath sounds are clear  Gastrointestinal: Soft and nontender. No distention.   Musculoskeletal: Nontender with normal range of motion in all extremities.  Neurologic:  Normal speech and language. No gross focal neurologic deficits  Skin:  Skin  is warm, dry and intact.  Psychiatric: Mood and affect are normal.   ____________________________________________    EKG  EKG reviewed and interpreted by myself shows atrial fibrillation at 70 bpm, widened QRS, nonspecific ST changes. No ST elevations noted.  ____________________________________________    RADIOLOGY  Chest x-ray shows increased plural effusion on the left side with cardiomegaly.  ____________________________________________   INITIAL IMPRESSION / ASSESSMENT AND PLAN / ED COURSE  Pertinent labs & imaging results that were available during my care of the patient were reviewed by me and considered in my medical decision making (see chart for details).  Patient presents to the emergency department with elevated white blood cell count 22,000. She also states approximately 2 weeks of difficulty breathing and intermittent right-sided chest discomfort with 1 week of cough. We will check labs, chest x-ray and closely monitor in the emergency department.  Patient's labs are resulted showing acute on chronic renal insufficiency. Leukocytosis of 18,000. Overall the patient does appear fatigued, 91% saturation on 2 L which is her normal oxygen requirement. She is currently on 4 L of oxygen at 98%. Patient's hemoglobin is largely stable. Patient is mildly hyperkalemic. She is receiving IV fluids in the emergency department. I discussed results with the patient and son, they state the patient cannot walk 10 feet without having to stop to rest due to shortness of breath. In reviewing the patient's records her congestive heart failure appears to worsen significantly over the past 1 year. Given the increased shortness of breath, 91% saturation on her baseline oxygen requirement with acute on chronic renal insufficiency we will admit the patient for further treatment and workup.  ____________________________________________  FINAL CLINICAL IMPRESSION(S) / ED DIAGNOSES  Dyspnea Chest  pain Acute on chronic renal insufficiency Hyperkalemia   Harvest Dark, MD 01/23/17 1530

## 2017-01-23 NOTE — ED Notes (Signed)
Pt visualized in bed at this time, NAD noted. VSS and WNL. Pt remains on 2L O2. Will continuie to monitor for further patient needs.

## 2017-01-24 ENCOUNTER — Inpatient Hospital Stay: Payer: Medicare Other

## 2017-01-24 ENCOUNTER — Encounter: Payer: Self-pay | Admitting: Nurse Practitioner

## 2017-01-24 DIAGNOSIS — D500 Iron deficiency anemia secondary to blood loss (chronic): Secondary | ICD-10-CM

## 2017-01-24 DIAGNOSIS — I5043 Acute on chronic combined systolic (congestive) and diastolic (congestive) heart failure: Secondary | ICD-10-CM

## 2017-01-24 DIAGNOSIS — R0602 Shortness of breath: Secondary | ICD-10-CM

## 2017-01-24 DIAGNOSIS — N184 Chronic kidney disease, stage 4 (severe): Secondary | ICD-10-CM

## 2017-01-24 DIAGNOSIS — I482 Chronic atrial fibrillation: Secondary | ICD-10-CM

## 2017-01-24 DIAGNOSIS — N179 Acute kidney failure, unspecified: Principal | ICD-10-CM

## 2017-01-24 LAB — URINALYSIS, COMPLETE (UACMP) WITH MICROSCOPIC
Bacteria, UA: NONE SEEN
Bilirubin Urine: NEGATIVE
GLUCOSE, UA: NEGATIVE mg/dL
HGB URINE DIPSTICK: NEGATIVE
Ketones, ur: NEGATIVE mg/dL
LEUKOCYTES UA: NEGATIVE
NITRITE: NEGATIVE
PH: 5 (ref 5.0–8.0)
Protein, ur: NEGATIVE mg/dL
RBC / HPF: NONE SEEN RBC/hpf (ref 0–5)
SPECIFIC GRAVITY, URINE: 1.014 (ref 1.005–1.030)
Squamous Epithelial / LPF: NONE SEEN

## 2017-01-24 LAB — GLUCOSE, CAPILLARY
Glucose-Capillary: 147 mg/dL — ABNORMAL HIGH (ref 65–99)
Glucose-Capillary: 155 mg/dL — ABNORMAL HIGH (ref 65–99)
Glucose-Capillary: 132 mg/dL — ABNORMAL HIGH (ref 65–99)
Glucose-Capillary: 153 mg/dL — ABNORMAL HIGH (ref 65–99)

## 2017-01-24 LAB — BASIC METABOLIC PANEL
ANION GAP: 11 (ref 5–15)
BUN: 93 mg/dL — ABNORMAL HIGH (ref 6–20)
CHLORIDE: 98 mmol/L — AB (ref 101–111)
CO2: 23 mmol/L (ref 22–32)
Calcium: 8.8 mg/dL — ABNORMAL LOW (ref 8.9–10.3)
Creatinine, Ser: 3.87 mg/dL — ABNORMAL HIGH (ref 0.44–1.00)
GFR calc non Af Amer: 10 mL/min — ABNORMAL LOW (ref >60)
GFR, EST AFRICAN AMERICAN: 12 mL/min — AB (ref >60)
GLUCOSE: 161 mg/dL — AB (ref 65–99)
Potassium: 5.9 mmol/L — ABNORMAL HIGH (ref 3.5–5.1)
Sodium: 132 mmol/L — ABNORMAL LOW (ref 135–145)

## 2017-01-24 LAB — CBC
HCT: 23.6 % — ABNORMAL LOW (ref 35.0–47.0)
HEMOGLOBIN: 7.7 g/dL — AB (ref 12.0–16.0)
MCH: 26.2 pg (ref 26.0–34.0)
MCHC: 32.7 g/dL (ref 32.0–36.0)
MCV: 80 fL (ref 80.0–100.0)
Platelets: 277 10*3/uL (ref 150–440)
RBC: 2.95 MIL/uL — AB (ref 3.80–5.20)
RDW: 17.3 % — ABNORMAL HIGH (ref 11.5–14.5)
WBC: 19.4 10*3/uL — ABNORMAL HIGH (ref 3.6–11.0)

## 2017-01-24 LAB — PROCALCITONIN: Procalcitonin: 1.61 ng/mL

## 2017-01-24 MED ORDER — AZITHROMYCIN 500 MG IV SOLR
250.0000 mg | Freq: Every day | INTRAVENOUS | Status: DC
  Administered 2017-01-24: 250 mg via INTRAVENOUS
  Filled 2017-01-24 (×2): qty 250

## 2017-01-24 MED ORDER — SODIUM CHLORIDE 0.9 % IV SOLN
INTRAVENOUS | Status: DC
  Administered 2017-01-24: 16:00:00 via INTRAVENOUS

## 2017-01-24 MED ORDER — SODIUM POLYSTYRENE SULFONATE 15 GM/60ML PO SUSP
30.0000 g | Freq: Once | ORAL | Status: AC
  Administered 2017-01-24: 30 g via ORAL
  Filled 2017-01-24: qty 120

## 2017-01-24 NOTE — Progress Notes (Signed)
[facility], [place] 01/24/17  Subjective:  Patient states that she presented to GI office yesterday for chest pain. From there, she was referred to the emergency room for evaluation. Upon admission yesterday, creatinine was noted to have increased to 3.39. Creatinine was 2.5 White blood cell count is elevated at 18.9. Hemoglobin is 8.5 Blood cultures are negative so far Chest x-ray shows increasing left pleural effusion Patient thinks she might have had low-grade fever and GI office yesterday Patient does report increased Dyspnea on exertion    Objective:  Vital signs in last 24 hours:  Temp:  [97.8 F (36.6 C)-98.4 F (36.9 C)] 97.9 F (36.6 C) (05/30 0531) Pulse Rate:  [68-78] 70 (05/30 0808) Resp:  [14-33] 16 (05/30 0531) BP: (102-164)/(28-98) 125/43 (05/30 0808) SpO2:  [91 %-99 %] 99 % (05/30 0808) Weight:  [83.9 kg (185 lb)] 83.9 kg (185 lb) (05/29 1134)  Weight change:  Filed Weights   01/23/17 1134  Weight: 83.9 kg (185 lb)    Intake/Output:    Intake/Output Summary (Last 24 hours) at 01/24/17 0945 Last data filed at 01/24/17 0559  Gross per 24 hour  Intake                0 ml  Output               50 ml  Net              -50 ml     Physical Exam: General: Sitting up in bed, no acute distress   HEENT Moist oral mucous membranes   Neck Supple   Pulm/lungs Left basilar crackles, decreased breath sounds on the right, normal breathing effort, oxygen supplementation by nasal cannula   CVS/Heart Regular, no rub or gallop   Abdomen:  Soft, nontender, nondistended   Extremities: No peripheral edema   Neurologic: Alert, oriented   Skin: No acute rashes           Basic Metabolic Panel:   Recent Labs Lab 01/23/17 1121  NA 133*  K 5.3*  CL 99*  CO2 20*  GLUCOSE 162*  BUN 85*  CREATININE 3.39*  CALCIUM 9.1     CBC:  Recent Labs Lab 01/23/17 1121  WBC 18.9*  NEUTROABS 17.7*  HGB 8.5*  HCT 26.8*  MCV 82.5  PLT  309     No results found for: HEPBSAG, HEPBSAB, HEPBIGM    Microbiology:  Recent Results (from the past 240 hour(s))  Culture, blood (Routine x 2)     Status: None (Preliminary result)   Collection Time: 01/23/17 11:21 AM  Result Value Ref Range Status   Specimen Description BLOOD RIGHT HAND  Final   Special Requests   Final    BOTTLES DRAWN AEROBIC ONLY BCHV A ANAEROBIC WAS SENT BUT WAS NOT LABEL   Culture NO GROWTH < 24 HOURS  Final   Report Status PENDING  Incomplete  Culture, blood (Routine x 2)     Status: None (Preliminary result)   Collection Time: 01/23/17 11:26 AM  Result Value Ref Range Status   Specimen Description BLOOD RIGHT WRIST  Final   Special Requests BOTTLES DRAWN AEROBIC AND ANAEROBIC BCLV  Final   Culture NO GROWTH < 24 HOURS  Final   Report Status PENDING  Incomplete    Coagulation Studies:  Recent Labs  01/23/17 1121  LABPROT 18.1*  INR 1.48    Urinalysis: No results for input(s): COLORURINE, LABSPEC, PHURINE, GLUCOSEU, [place], BILIRUBINUR, KETONESUR, PROTEINUR,  UROBILINOGEN, NITRITE, LEUKOCYTESUR in the last 72 hours.  Invalid input(s): APPERANCEUR    Imaging: Dg Chest 2 View  Result Date: 01/23/2017 CLINICAL DATA:  Cough and chest congestion. History of asthma -COPD, coronary artery disease, lung malignancy, elevated white blood cell count currently. EXAM: CHEST  2 VIEW COMPARISON:  PA and lateral chest x-ray of Jan 12, 2017 FINDINGS: The right lung is well-expanded. A stable tiny pleural effusion is observed. There is stable density in the right apex radiating toward the right hilum. There are stable surgical clips here. On the left there is increased pleural fluid at the lung base. The cardiac silhouette is enlarged. The pulmonary vascularity is not clearly engorged. The ICD is in stable position. There is calcification in the wall of the aortic arch. The observed bony thorax exhibits no acute abnormality. IMPRESSION: Increased volume of pleural  fluid on the left. Stable tiny right pleural effusion. Stable density in the right pulmonary apex. Stable cardiomegaly without pulmonary vascular congestion. No acute pneumonia is observed. Thoracic aortic atherosclerosis. Electronically Signed   By: David  [place] M.D.   On: 01/23/2017 14:05     Medications:    . ALPRAZolam  0.5 mg Oral QHS  . atorvastatin  20 mg Oral QPM  . cholecalciferol  400 Units Oral Daily  . cyanocobalamin  500 mcg Oral Daily  . fenofibrate  54 mg Oral Daily  . ferrous sulfate  325 mg Oral Q breakfast  . insulin aspart  0-9 Units Subcutaneous TID WC  . metoprolol succinate  12.5 mg Oral Daily  . multivitamin-lutein  1 capsule Oral Daily  . pantoprazole  40 mg Oral BID  . sertraline  50 mg Oral Daily  . tiotropium  18 mcg Inhalation Daily   acetaminophen **OR** acetaminophen, albuterol, fluticasone furoate-vilanterol, nitroGLYCERIN, ondansetron **OR** ondansetron (ZOFRAN) IV, senna-docusate, traMADol  Assessment/ Plan:  83 y.o.caucasian female with diabetes mellitus type II, coronary artery disease, hypertension, hyperlipidemia, generalized anxiety disorder, gout, anemia, history of lung cancer, COPD, atrial fibrillation, biventricular ICD, fatty liver disease, GERD, osteoporosis   1. ARF on CKD st 4 Baseline creatinine of 2 /GFR 22 noted on 11/14/2006 Acute renal failure is likely multifactorial related to low normal blood pressure, concurrent illness leading to ATN Avoid hypotension Follow BMP daily   2. Anemia of chronic kidney disease and possibly blood loss - was on Eliquis which was stopped as outpatient recently - GI eval was planned as outpatient   3. SOB with left pleural effusion - in light of increased WBC, ? Possibly pneumonia - will discuss with hospitalist team  4. Mild Hyperkalemia - Low potassium diet.     LOS: 1 Surabhi Gadea 5/30/20189:45 AM  [facility] [place], [place]

## 2017-01-24 NOTE — Progress Notes (Signed)
Bladder scan: 227mL  Continue to monitor.

## 2017-01-24 NOTE — Care Management (Signed)
Patient with 2 admissions within last 6 months.  Sent to ED by PCP for cough and shortness of breath.  Admitted with CHF and  pleural effusion.  She is on chronic oxygen at home.  She says she has been trying to get a portable concentrator for the past year.  Says her PCP wrote an order for this one year ago.  Current 02 is provided by [employer].  Spoke with Corene Cornea and he will speak to the patient.  Currently has no services in the home.  Lives with her son who provides transport to MD appointments.  Independent in all adls, denies issues accessing medical care, obtaining medications or with transportation.  Current with her PCP.  Referral to the [facility].  Has access to walker , rollator and canes

## 2017-01-24 NOTE — Progress Notes (Signed)
[facility] NAME: Regina Burke    MR#:  [id number]  DATE OF BIRTH:  11-14-1932  SUBJECTIVE:  CHIEF COMPLAINT:   Chief Complaint  Patient presents with  . Shortness of Breath      Recent admission for CHF and A. Fib.   Came again with worsening shortness of breath with minimal exertion. Noted to have acute on chronic renal failure.    REVIEW OF SYSTEMS:  CONSTITUTIONAL: No fever, Positive fatigue or weakness.  EYES: No blurred or double vPositive for vision.  EARS, NOSE, AND THROAT: No tinnitus or ear pain.  RESPIRATORY: No cough, shortness of breath, wheezing or hemoptysis.  CARDIOVASCULAR: No chest pain, orthopnea, edema.  GASTROINTESTINAL: No nausea, vomiting, diarrhea or abdominal pain.  GENITOURINARY: No dysuria, hematuria.  ENDOCRINE: No polyuria, nocturia,  HEMATOLOGY: No anemia, easy bruising or bleeding SKIN: No rash or lesion. MUSCULOSKELETAL: No joint pain or arthritis.   NEUROLOGIC: No tingling, numbness, weakness.  PSYCHIATRY: No anxiety or depression.   ROS  DRUG ALLERGIES:   Allergies  Allergen Reactions  . Ciprofloxacin Shortness Of Breath, Itching and Rash  . Doxycycline Shortness Of Breath, Itching and Rash  . Penicillins Shortness Of Breath, Itching, Rash and Other (See Comments)    Has patient had a PCN reaction causing immediate rash, facial/tongue/throat swelling, SOB or lightheadedness with hypotension: Yes Has patient had a PCN reaction causing severe rash involving mucus membranes or skin necrosis: No Has patient had a PCN reaction that required hospitalization No Has patient had a PCN reaction occurring within the last 10 years: No If all of the above answers are "NO", then may proceed with Cephalosporin use.  . Sulfa Antibiotics Shortness Of Breath, Itching and Rash  . Latex Itching  . Morphine And Related Itching  . Albuterol Sulfate   . Cefuroxime Rash    Blisters in mouth    VITALS:   Blood pressure (!) 131/36, pulse 69, temperature 98.2 F (36.8 C), temperature source Oral, resp. rate 18, height 5\' 4"  (1.626 m), weight 83.9 kg (185 lb), SpO2 97 %.  PHYSICAL EXAMINATION:  GENERAL:  83 y.o.-year-old patient lying in the bed with no acute distress.  EYES: Pupils equal, round, reactive to light and accommodation. No scleral icterus. Extraocular muscles intact.  HEENT: Head atraumatic, normocephalic. Oropharynx and nasopharynx clear.  NECK:  Supple, no jugular venous distention. No thyroid enlargement, no tenderness.  LUNGS: Normal breath sounds bilaterally, no wheezing, have some crepitation. No use of accessory muscles of respiration.  CARDIOVASCULAR: S1, S2 normal. No murmurs, rubs, or gallops.  ABDOMEN: Soft, nontender, nondistended. Bowel sounds present. No organomegaly or mass.  EXTREMITIES: No pedal edema, cyanosis, or clubbing.  NEUROLOGIC: Cranial nerves II through XII are intact. Muscle strength 5/5 in all extremities. Sensation intact. Gait not checked.  PSYCHIATRIC: The patient is alert and oriented x 3.  SKIN: No obvious rash, lesion, or ulcer.   Physical Exam LABORATORY PANEL:   CBC  Recent Labs Lab 01/24/17 1120  WBC 19.4*  HGB 7.7*  HCT 23.6*  PLT 277   ------------------------------------------------------------------------------------------------------------------  Chemistries   Recent Labs Lab 01/23/17 1121 01/24/17 1120  NA 133* 132*  K 5.3* 5.9*  CL 99* 98*  CO2 20* 23  GLUCOSE 162* 161*  BUN 85* 93*  CREATININE 3.39* 3.87*  CALCIUM 9.1 8.8*  AST 26  --   ALT 14  --   ALKPHOS 53  --   BILITOT 1.8*  --    ------------------------------------------------------------------------------------------------------------------  Cardiac Enzymes  Recent Labs Lab 01/23/17 1121  TROPONINI 0.04*   ------------------------------------------------------------------------------------------------------------------  RADIOLOGY:  Dg Chest  2 View  Result Date: 01/23/2017 CLINICAL DATA:  Cough and chest congestion. History of asthma -COPD, coronary artery disease, lung malignancy, elevated white blood cell count currently. EXAM: CHEST  2 VIEW COMPARISON:  PA and lateral chest x-ray of Jan 12, 2017 FINDINGS: The right lung is well-expanded. A stable tiny pleural effusion is observed. There is stable density in the right apex radiating toward the right hilum. There are stable surgical clips here. On the left there is increased pleural fluid at the lung base. The cardiac silhouette is enlarged. The pulmonary vascularity is not clearly engorged. The ICD is in stable position. There is calcification in the wall of the aortic arch. The observed bony thorax exhibits no acute abnormality. IMPRESSION: Increased volume of pleural fluid on the left. Stable tiny right pleural effusion. Stable density in the right pulmonary apex. Stable cardiomegaly without pulmonary vascular congestion. No acute pneumonia is observed. Thoracic aortic atherosclerosis. Electronically Signed   By: David  Martinique M.D.   On: 01/23/2017 14:05    ASSESSMENT AND PLAN:   Active Problems:   CHF, acute on chronic (HCC)  * Acute respiratory failure with hypoxia   Multifactorial, likely CHF and COPD, also seems to be having some pneumonia.   The basic count and pro-calcitonin is high, patient did complain of shortness of breath.   She is not very sure if she had low-grade fever or not, does not have sputum production.   We will treat with azithromycin at this time, and we'll get a CT scan of the chest if more evidence that he may have give broad-spectrum coverage.   Check urinalysis.  * Chronic diastolic CHF.   Continue monitoring, hold Lasix because of renal failure, appreciated cardiology consult.  * Acute on chronic renal failure, CAD stage III.    Avoid nephrotoxic agents, we'll give gentle IV hydration and follow, nephrologist on the case.  * Hyperkalemia   Give  Kayexalate to help. Continue monitoring.  * Complain of melena   Hemoglobin appears stable lately, anti-coagulation on hold, continue monitoring.  * Elevated troponin/ demand ischemia   Cardiologist on the case, continue beta blocker and statins.  * COPD   No acute wheezing.   Continue home inhalers.      All the records are reviewed and case discussed with Care Management/Social Workerr. Management plans discussed with the patient, family and they are in agreement.  CODE STATUS: DNR.  TOTAL TIME TAKING CARE OF THIS PATIENT: 35 minutes.   POSSIBLE D/C IN 1-2 DAYS, DEPENDING ON CLINICAL CONDITION.   Vaughan Basta M.D on 01/24/2017   Between 7am to 6pm - Pager - [phone]  After 6pm go to [email or web address] - password [email or web address] [facility]  Office  [phone]  CC: Primary care physician; Tracie Harrier, MD  Note: This dictation was prepared with Dragon dictation along with smaller phrase technology. Any transcriptional errors that result from this process are unintentional.

## 2017-01-24 NOTE — Clinical Social Work Note (Addendum)
CSW acknowledging consult that patient has readmissions.  CSW will monitor in case patient has any social work needs.  3:45pm CSW spoke to case manager and patient does not currently have any social work needs, CSW to sign off please reconsult if social work needs arise.   Jones Broom. [place]ie, MSW, [place]  01/24/2017 11:53 AM

## 2017-01-24 NOTE — Progress Notes (Signed)
PT Attempt Note  Patient Details Name: MILLETTE HALBERSTAM MRN: [id number] DOB: 04-30-1933   Attempt Treatment:    Reason Eval/Treat Not Completed: Patient declined, no reason specified. Chart reviewed. Attempted to see patient however she refuses at this time due to feeling unwell and weak. Offered to help her to the recliner but she declined. Pt agrees to work with therapy on a separate date. Will attempt at later date.  Lyndel Safe Taveon Enyeart PT, DPT   Linn Goetze 01/24/2017, 1:20 PM

## 2017-01-24 NOTE — Consult Note (Signed)
Cardiology Consult    Patient ID: Regina Burke MRN: [id number], DOB/AGE: [birth year]   Admit date: 01/23/2017 Date of Consult: 01/24/2017  Primary Physician: Tracie Harrier, MD Primary Cardiologist: Johnny Bridge, MD  Requesting Provider: Doylene Canning  Patient Profile    Regina Burke is a 83 y.o. female with a history of CAD s/p prior NSTEMI and PCI/DES of the OM3 (11/2014), chronic combined CHF (EF 30-35% 03/2016), CKD IV, COPD, HTN, HL, DMII, persistent AFib s/p AVN RFCA & BiV PPM placement, COPD, IDA due to chronic blood loss, OSA, and gout, who is being seen today for the evaluation of dyspnea and indigestion at the request of Dr. Anselm Jungling.  Past Medical History   Past Medical History:  Diagnosis Date  . Anemia   . Arthritis   . Asthma   . Cardiac resynchronization therapy pacemaker (CRT-P) in place    a. 03/31/16:  Medtronic Percepta Quad CRT-P MRI SureScan (serial Number [id number]) device.  . Chronic diastolic CHF (congestive heart failure) ([place])    a. 10/2015 Echo: EF 55-65%, Gr1 DD, mild MR, mildly dil LA, nl RV fxn, nl PASP;  b. 03/2016 Echo: EF 30-35%, mod MR, mod dil LA; c. 12/2016 Echo: EF 60-65%, no rwma, mild AS, mildly dil LA, PASP 97mmHg.  . CKD (chronic kidney disease), stage IV ([place])   . COPD (chronic obstructive pulmonary disease) ([place])   . Coronary artery disease    a. 11/2014 NSTEMI/PCI: LM nl, LAD 80m, D1 30, LCX mild dzs, OM1 20p, OM2 30m, OM3 90p (2.25x8 Promus Premier DES), RCA nl.   . Cough    CHRONIC AT NIGHT  . Cushing's disease ([place])   . Depression   . Diverticulitis   . Edema    FEET/LEGS  . GERD (gastroesophageal reflux disease)   . Gout   . History of hiatal hernia   . History of pneumonia   . HLD (hyperlipidemia)   . HOH (hard of hearing)   . Hypertensive heart disease   . Lung cancer ([place]) dx'd 2014   S/P radiation 2015  . Migraine   . Mixed Ischemic and Nonischemic Cardiomyopathy ([place])    a. 03/2016 Echo: EF 30-35%;  b. 12/2016 Echo: EF  60-65%.  . Moderate mitral regurgitation   . Multiple allergies   . Myocardial infarction ([place])   . Oxygen deficiency    2LITERS  . Persistent atrial fibrillation ([place])    a. CHADS2VASc ==> 7 (CHF, HTN, age x 2, DM, vascular disease, and gender)--was on renal dosed eliquis but this was d/c'd in 12/2016 in setting of dark stools/anemia;  c. 03/2016 s/p AVN and MDT BiV ICD placement.  . Presence of permanent cardiac pacemaker   . S/P AV nodal ablation    a. on 03/31/16 for persistant afib with CRT-P placement  . Sleep apnea   . Type II diabetes mellitus ([place])     Past Surgical History:  Procedure Laterality Date  . ABDOMINAL HYSTERECTOMY    . ABLATION     July 2017  . ADRENALECTOMY Left 1980's   "[place]"  . APPENDECTOMY    . BREAST CYST EXCISION Left   . CATARACT EXTRACTION W/PHACO Right 12/28/2015   Procedure: CATARACT EXTRACTION PHACO AND INTRAOCULAR LENS PLACEMENT (IOC);  Surgeon: Birder Robson, MD;  Location: [facility] ORS;  Service: Ophthalmology;  Laterality: Right;  [place] 48.4   . CATARACT EXTRACTION W/PHACO Left 11/14/2016   Procedure: CATARACT EXTRACTION PHACO AND INTRAOCULAR LENS PLACEMENT (IOC);  Surgeon: Birder Robson,  MD;  Location: [facility] ORS;  Service: Ophthalmology;  Laterality: Left;  [place] 59.8 AP% 18.1 CDE 10.78 Fluid pack lot # [id number]  . CHOLECYSTECTOMY    . CORONARY ANGIOPLASTY WITH STENT PLACEMENT  12/23/2014  . ELECTROPHYSIOLOGIC STUDY N/A 03/08/2016   Procedure: CARDIOVERSION;  Surgeon: Wende Bushy, MD;  Location: [facility] ORS;  Service: Cardiovascular;  Laterality: N/A;  . ELECTROPHYSIOLOGIC STUDY N/A 03/07/2016   Procedure: Cardioversion;  Surgeon: Wende Bushy, MD;  Location: [facility] ORS;  Service: Cardiovascular;  Laterality: N/A;  . ELECTROPHYSIOLOGIC STUDY N/A 03/31/2016   Procedure: AV Node Ablation;  Surgeon: Will Meredith Leeds, MD;  Location: [facility] CV LAB;  Service: Cardiovascular;  Laterality: N/A;  . EP IMPLANTABLE DEVICE N/A 03/31/2016   Procedure: BiV  Pacemaker Insertion CRT-P;  Surgeon: Will Meredith Leeds, MD;  Location: [place] [facility];  Service: Cardiovascular;  Laterality: N/A;  . FRACTURE SURGERY    . INSERT / REPLACE / REMOVE PACEMAKER  02/2016  . LEFT HEART CATHETERIZATION WITH CORONARY ANGIOGRAM N/A 12/23/2014   Procedure: LEFT HEART CATHETERIZATION WITH CORONARY ANGIOGRAM;  Surgeon: Burnell Blanks, MD;  Location: [facility] CATH LAB;  Service: Cardiovascular;  Laterality: N/A;  . PERCUTANEOUS CORONARY STENT INTERVENTION (PCI-S)  12/23/2014   Procedure: PERCUTANEOUS CORONARY STENT INTERVENTION (PCI-S);  Surgeon: Burnell Blanks, MD;  Location: [facility] CATH LAB;  Service: Cardiovascular;;  Promus 2.25x8  . TONSILLECTOMY    . TRANSTHORACIC ECHOCARDIOGRAM  11/26/2015   Technically difficult study. EF 55-60%. Normal wall motion. GR 1 DD.  . TUBAL LIGATION    . WRIST FRACTURE SURGERY Bilateral ~ 2000     Allergies  Allergies  Allergen Reactions  . Ciprofloxacin Shortness Of Breath, Itching and Rash  . Doxycycline Shortness Of Breath, Itching and Rash  . Penicillins Shortness Of Breath, Itching, Rash and Other (See Comments)    Has patient had a PCN reaction causing immediate rash, facial/tongue/throat swelling, SOB or lightheadedness with hypotension: Yes Has patient had a PCN reaction causing severe rash involving mucus membranes or skin necrosis: No Has patient had a PCN reaction that required hospitalization No Has patient had a PCN reaction occurring within the last 10 years: No If all of the above answers are "NO", then may proceed with Cephalosporin use.  . Sulfa Antibiotics Shortness Of Breath, Itching and Rash  . Latex Itching  . Morphine And Related Itching  . Albuterol Sulfate   . Cefuroxime Rash    Blisters in mouth    History of Present Illness    83 y/o ? with the above complex PMH including CAD s/p prior NSTEMI w/ PCI/DES of the OM3 in 11/2014, chronic combined CHF (EF 30-35% 03/2016, improved to 60-65% by  echo in 12/2016), CKD IV, COPD, HTN, HL, DM II, persistent AFib s/p AVN RFCA & BiV PPM placement, COPD, IDA w/ chronic GI blood loss, OSA, and gout.  She was recently admitted to [facility] with atypical chest pain and indigestion.  She ruled out.  CXR showed stable bilat pl effusions.  There was a pleuritic component ot her chest pain.  Echo showed nl EF with small, free-flowing pericardial effusion.  Symptoms resolved and thus no further therapy was advised, esp as we wished to limit exposure to nsaids/colchicine in the setting of ckd iv.  She was discharged and f/u with Dr. Rockey Situ on 5/22, @ which time she reported numerous black stools.  H/H was 8.9/27.1, which was sl higher than recent hospitalization, but down from early April.  Eliquis was d/c'd  and arrangements were made for her to f/u with heme/onc and primary care.  Since her clinic visit, she has noted ongoing indigestion/epigastric discomfort and DOE.  Wt has been rising some.  She was seen by GI on 5/29.  Anemia was slightly worse (Hgb 8.4) and WBC were elevated @ 22K w/ left shift.  She was contacted and advised to present to the ED for eval.  BMET later returned showing BUN of 83 w/ Creat of 3.2.  K 5.4. CXR showed some increase in L pleural effusion.  She was admitted by IM.  This morning, she reports feeling reasonably well or at least stable.  She asked if she could go home.  She has not been up and walking yet.  She has chronic orthopnea and sleeps in a recliner @ baseline.  She has not had any recent edema.  Inpatient Medications    . ALPRAZolam  0.5 mg Oral QHS  . atorvastatin  20 mg Oral QPM  . cholecalciferol  400 Units Oral Daily  . cyanocobalamin  500 mcg Oral Daily  . fenofibrate  54 mg Oral Daily  . ferrous sulfate  325 mg Oral Q breakfast  . insulin aspart  0-9 Units Subcutaneous TID WC  . metoprolol succinate  12.5 mg Oral Daily  . multivitamin-lutein  1 capsule Oral Daily  . pantoprazole  40 mg Oral BID  . sertraline  50 mg  Oral Daily  . tiotropium  18 mcg Inhalation Daily    Family History    Family History  Problem Relation Age of Onset  . Heart disease Mother   . Diabetes Mother   . Osteoarthritis Mother   . Hypertension Mother   . Heart disease Father   . Hypertension Father   . COPD Brother     Social History    Social History   Social History  . Marital status: Widowed    Spouse name: N/A  . Number of children: N/A  . Years of education: N/A   Occupational History  . retired    Social History Main Topics  . Smoking status: Former Smoker    Packs/day: 1.00    Years: 45.00    Types: Cigarettes    Quit date: 04/25/1994  . Smokeless tobacco: Never Used  . Alcohol use No     Comment: 12/23/2014 "might have a couple mixed drinks/year"  . Drug use: No  . Sexual activity: No   Other Topics Concern  . Not on file   Social History Narrative   Lives locally with son.  Does not routinely exercise.     Review of Systems    General:  No chills, fever, night sweats or weight changes.  Cardiovascular:  +++ chest/epigastric pain, +++ chronic dyspnea on exertion, no edema, +++ orthopnea, no palpitations, paroxysmal nocturnal dyspnea. Dermatological: No rash, lesions/masses Respiratory: No cough, +++ dyspnea Urologic: No hematuria, dysuria Abdominal:   +++ indigestion.  No nausea, vomiting, diarrhea, bright red blood per rectum, +++ melena, no hematemesis Neurologic:  No visual changes, wkns, changes in mental status. All other systems reviewed and are otherwise negative except as noted above.  Physical Exam    Blood pressure (!) 125/43, pulse 70, temperature 97.9 F (36.6 C), temperature source Oral, resp. rate 16, height 5\' 4"  (1.626 m), weight 185 lb (83.9 kg), SpO2 99 %.  General: Pleasant, NAD Psych: Normal affect. Neuro: Alert and oriented X 3. Moves all extremities spontaneously. HEENT: Normal  Neck: Supple without bruits or JVD.  Lungs:  Resp regular and unlabored, diminished  breath sounds L base ~ 1/3 way up. Heart: RRR no s3, s4, 2/6 syst murmur heard throughout. Abdomen: Soft, non-tender, non-distended, BS + x 4.  Extremities: No clubbing, cyanosis or edema. DP/PT/Radials 2+ and equal bilaterally.  Labs     Recent Labs  01/23/17 1121  TROPONINI 0.04*   Lab Results  Component Value Date   WBC 18.9 (H) 01/23/2017   HGB 8.5 (L) 01/23/2017   HCT 26.8 (L) 01/23/2017   MCV 82.5 01/23/2017   PLT 309 01/23/2017     Recent Labs Lab 01/23/17 1121  NA 133*  K 5.3*  CL 99*  CO2 20*  BUN 85*  CREATININE 3.39*  CALCIUM 9.1  PROT 7.6  BILITOT 1.8*  ALKPHOS 53  ALT 14  AST 26  GLUCOSE 162*   Lab Results  Component Value Date   CHOL 202 (H) 12/23/2014   HDL 28 (L) 12/23/2014   LDLCALC 107 (H) 12/23/2014   TRIG 337 (H) 12/23/2014     Radiology Studies    Dg Chest 2 View  Result Date: 01/23/2017 CLINICAL DATA:  Cough and chest congestion. History of asthma -COPD, coronary artery disease, lung malignancy, elevated white blood cell count currently. EXAM: CHEST  2 VIEW COMPARISON:  PA and lateral chest x-ray of Jan 12, 2017 FINDINGS: The right lung is well-expanded. A stable tiny pleural effusion is observed. There is stable density in the right apex radiating toward the right hilum. There are stable surgical clips here. On the left there is increased pleural fluid at the lung base. The cardiac silhouette is enlarged. The pulmonary vascularity is not clearly engorged. The ICD is in stable position. There is calcification in the wall of the aortic arch. The observed bony thorax exhibits no acute abnormality. IMPRESSION: Increased volume of pleural fluid on the left. Stable tiny right pleural effusion. Stable density in the right pulmonary apex. Stable cardiomegaly without pulmonary vascular congestion. No acute pneumonia is observed. Thoracic aortic atherosclerosis. Electronically Signed   By: David  Martinique M.D.   On: 01/23/2017 14:05   Dg Chest 2  View  Result Date: 01/12/2017 CLINICAL DATA:  Chest pain EXAM: CHEST  2 VIEW COMPARISON:  April 17, 202018 FINDINGS: Persistent scarring is noted in the right upper lobe. Surgical clips are noted. Pacing device is again seen and stable. Cardiac shadow remains enlarged. Aortic calcifications are again noted. Chronic blunting of the costophrenic angles is seen stable from the prior exam. Some mild left basilar atelectasis is noted IMPRESSION: Stable small effusions. Mild left basilar atelectasis. Chronic changes in the right apex. Electronically Signed   By: Inez Catalina M.D.   On: 01/12/2017 09:03   Dg Chest 2 View  Result Date: 12/28/2016 CLINICAL DATA:  Shortness of breath for several days EXAM: CHEST  2 VIEW COMPARISON:  04/01/2016 FINDINGS: Cardiac shadow is mildly enlarged. Pacing device is again seen and stable. Postsurgical changes are noted overlying the right apex. Chronic scarring is noted in the right upper lobe. Improved aeration is noted in the bases bilaterally. Small pleural effusions remain. IMPRESSION: Overall improved aeration.  Small pleural effusions remain. Electronically Signed   By: Inez Catalina M.D.   On: April 17, 202018 10:29    ECG & Cardiac Imaging    No ECG available.  2D Echocardiogram 5.19.2018 Left ventricle: The cavity size was normal. Systolic function was   normal. The estimated ejection fraction was in the range of 60%   to 65%. Wall motion  was normal; there were no regional wall   motion abnormalities. The study is not technically sufficient to   allow evaluation of LV diastolic function. - Ventricular septum: Septal motion showed paradox. These changes   are consistent with right ventricular pacing. - Aortic valve: There was mild stenosis. Valve area (Vmax): 1.8   cm^2. - Mitral valve: Calcified annulus. - Left atrium: The atrium was mildly dilated. - Pulmonary arteries: Systolic pressure was mildly increased. PA   peak pressure: 40 mm Hg (S). - Pericardium,  extracardiac: A small, free-flowing pericardial   effusion was identified circumferential to the heart. The fluid   had no internal echoes.   Impressions:   - Markedly improved LV systolic function since August 2017.  Assessment & Plan    1.  Chronic Anemia/Melena:  Pt with h/o anemia and recent reports of melena.  H/H trending lower since early April 2018.  Seen by GI on 5/29 with finding of worsening anemia and leukocytosis.  Further w/u per IM/GI.  Eliquis on hold since 5/22.  2.  Acute on chronic resp failure:  Multifactorial in the setting of COPD on home O2, diast CHF, and now increasing L pleural effusion.  She has chronic orthopnea and DOE.  Wt has been relatively stable @ home, though she is up~ 3 lbs since last clinic visit.  Anemia and pleural effusion likely playing a larger role than CHF at this point.  Rec pulmonary eval for consideration of therapeutic and diagnostic thoracentesis on left.  May also need to consider transfusion for symptomatic anemia.  3.  Chronic diastolic chf:  Recent echo showed nl EF.  Not particularly volume overloaded on exam.  In setting of AKI/CKD IV, lasix now on hold.  HR/BP stable.  Watch volume closely if she ends up being transfused.  4.  Acute on chronic stage iv kidney dzs:  Creat worsened in the setting of outpt diuresis and ongoing anemia.  Diuretics on hold.  Was only on lasix 20 daily prior to admission.  She would benefit from nephrology eval for further recs on appropriate outpt diuretic dosing.  5.  GERD/? PUD:  See #1.  Per IM/GI.  6.  Leukocytosis:  No PNA on cxr.  UA pending.  She does report polyuria w/ incomplete emptying.  7.  Demand ischemia/elevated troponin/CAD:  Pt with mild trop elevation in setting of above.  Anemia likely contributing.  This does not appear to represent ACS.  Cont  blocker and statin therapy.  Signed, Murray Hodgkins, NP 01/24/2017, 11:44 AM

## 2017-01-25 ENCOUNTER — Inpatient Hospital Stay: Payer: Medicare Other

## 2017-01-25 ENCOUNTER — Inpatient Hospital Stay (HOSPITAL_COMMUNITY)
Admit: 2017-01-25 | Discharge: 2017-01-25 | Disposition: A | Discharge status: Home / Self Care | Payer: Medicare Other | Attending: Internal Medicine | Admitting: Internal Medicine

## 2017-01-25 DIAGNOSIS — R0602 Shortness of breath: Secondary | ICD-10-CM

## 2017-01-25 DIAGNOSIS — I4891 Unspecified atrial fibrillation: Secondary | ICD-10-CM

## 2017-01-25 DIAGNOSIS — I5033 Acute on chronic diastolic (congestive) heart failure: Secondary | ICD-10-CM

## 2017-01-25 DIAGNOSIS — I509 Heart failure, unspecified: Secondary | ICD-10-CM

## 2017-01-25 DIAGNOSIS — I340 Nonrheumatic mitral (valve) insufficiency: Secondary | ICD-10-CM

## 2017-01-25 DIAGNOSIS — N189 Chronic kidney disease, unspecified: Secondary | ICD-10-CM

## 2017-01-25 LAB — CBC
HCT: 22.5 % — ABNORMAL LOW (ref 35.0–47.0)
Hemoglobin: 7.4 g/dL — ABNORMAL LOW (ref 12.0–16.0)
MCH: 26.4 pg (ref 26.0–34.0)
MCHC: 32.8 g/dL (ref 32.0–36.0)
MCV: 80.5 fL (ref 80.0–100.0)
PLATELETS: 242 10*3/uL (ref 150–440)
RBC: 2.79 MIL/uL — AB (ref 3.80–5.20)
RDW: 17.5 % — ABNORMAL HIGH (ref 11.5–14.5)
WBC: 10.7 10*3/uL (ref 3.6–11.0)

## 2017-01-25 LAB — RENAL FUNCTION PANEL
ANION GAP: 11 (ref 5–15)
Albumin: 3.3 g/dL — ABNORMAL LOW (ref 3.5–5.0)
BUN: 99 mg/dL — ABNORMAL HIGH (ref 6–20)
CHLORIDE: 100 mmol/L — AB (ref 101–111)
CO2: 24 mmol/L (ref 22–32)
Calcium: 8.3 mg/dL — ABNORMAL LOW (ref 8.9–10.3)
Creatinine, Ser: 3.81 mg/dL — ABNORMAL HIGH (ref 0.44–1.00)
GFR calc non Af Amer: 10 mL/min — ABNORMAL LOW (ref >60)
GFR, EST AFRICAN AMERICAN: 12 mL/min — AB (ref >60)
Glucose, Bld: 112 mg/dL — ABNORMAL HIGH (ref 65–99)
POTASSIUM: 4.9 mmol/L (ref 3.5–5.1)
Phosphorus: 6.5 mg/dL — ABNORMAL HIGH (ref 2.5–4.6)
Sodium: 135 mmol/L (ref 135–145)

## 2017-01-25 LAB — ECHOCARDIOGRAM LIMITED
HEIGHTINCHES: 64 in
Weight: 3019.2 oz

## 2017-01-25 LAB — PREPARE RBC (CROSSMATCH)

## 2017-01-25 LAB — GLUCOSE, CAPILLARY
GLUCOSE-CAPILLARY: 112 mg/dL — AB (ref 65–99)
GLUCOSE-CAPILLARY: 186 mg/dL — AB (ref 65–99)
Glucose-Capillary: 123 mg/dL — ABNORMAL HIGH (ref 65–99)
Glucose-Capillary: 160 mg/dL — ABNORMAL HIGH (ref 65–99)

## 2017-01-25 MED ORDER — SODIUM CHLORIDE 0.9 % IV SOLN: INTRAVENOUS | Status: DC

## 2017-01-25 MED ORDER — TRAMADOL HCL 50 MG PO TABS
50.0000 mg | ORAL_TABLET | Freq: Two times a day (BID) | ORAL | Status: DC | PRN
  Administered 2017-01-25 – 2017-01-29 (×5): 50 mg via ORAL
  Filled 2017-01-25 (×5): qty 1

## 2017-01-25 MED ORDER — SODIUM CHLORIDE 0.9 % IV SOLN
Freq: Once | INTRAVENOUS | Status: AC
  Administered 2017-01-25: 21:00:00 via INTRAVENOUS

## 2017-01-25 NOTE — Plan of Care (Signed)
Problem: Safety: Goal: Ability to remain free from injury will improve Outcome: Progressing Fall precautions in place, non skid socks when oob, no falls this admission  Problem: Pain Managment: Goal: General experience of comfort will improve Outcome: Progressing Prn medications, no voiced complaints of pain  Problem: Activity: Goal: Risk for activity intolerance will decrease Outcome: Not Progressing Physical therapy ordered, pt refusing to work with them right now  Problem: Cardiac: Goal: Ability to achieve and maintain adequate cardiopulmonary perfusion will improve Outcome: Not Progressing Intake and output, not on diuretics at this point due to increased kidney function

## 2017-01-25 NOTE — Op Note (Signed)
  OPERATIVE NOTE   PROCEDURE: 1. Ultrasound guidance for vascular access right femoral vein 2. Placement of a 30 cm triple lumen dialysis catheter right femoral vein  PRE-OPERATIVE DIAGNOSIS: 1. Acute renal failure 2. CHF/SOB 3. Hyperkalemia.  POST-OPERATIVE DIAGNOSIS: Same  SURGEON: Leotis Pain, MD  ASSISTANT(S): None  ANESTHESIA: local  ESTIMATED BLOOD LOSS: Minimal   FINDING(S): 1. None  SPECIMEN(S): None  INDICATIONS:  Patient is a 83 y.o.female who presents with renal failure.  Risks and benefits were discussed, and informed consent was obtained..  DESCRIPTION: After obtaining full informed written consent, the patient was laid flat in the bed. The right groin was sterilely prepped and draped in a sterile surgical field was created. The right femoral vein was visualized with ultrasound and found to be widely patent. It was then accessed under direct guidance without difficulty with a Seldinger needle and a permanent image was recorded. A J-wire was then placed. After skin nick and dilatation, a 30 cm triple lumen dialysis catheter was placed over the wire and the wire was removed. The lumens withdrew dark red nonpulsatile blood and flushed easily with sterile saline. The catheter was secured to the skin with 3 nylon sutures. Sterile dressing was placed.  COMPLICATIONS: None  CONDITION: Stable  Leotis Pain 01/25/2017 4:53 PM  This note was created with Dragon Medical transcription system. Any errors in dictation are purely unintentional.

## 2017-01-25 NOTE — Progress Notes (Signed)
Transferred to specials via bed

## 2017-01-25 NOTE — Progress Notes (Signed)
Back from specials, trialysis catheter to rt groin, dressing dry and intact.

## 2017-01-25 NOTE — Progress Notes (Signed)
Back from ultrasound

## 2017-01-25 NOTE — Progress Notes (Signed)
Progress Note  Patient Name: Regina Burke Date of Encounter: 01/25/2017  Primary Cardiologist: Johnny Bridge, MD   Subjective   Feels better this am.  Breathing stable.  H/H down.  Inpatient Medications    Scheduled Meds: . ALPRAZolam  0.5 mg Oral QHS  . atorvastatin  20 mg Oral QPM  . cholecalciferol  400 Units Oral Daily  . fenofibrate  54 mg Oral Daily  . ferrous sulfate  325 mg Oral Q breakfast  . insulin aspart  0-9 Units Subcutaneous TID WC  . multivitamin-lutein  1 capsule Oral Daily  . pantoprazole  40 mg Oral BID  . sertraline  50 mg Oral Daily  . tiotropium  18 mcg Inhalation Daily  . vitamin B-12  500 mcg Oral Daily   Continuous Infusions:  PRN Meds: acetaminophen **OR** acetaminophen, fluticasone furoate-vilanterol, nitroGLYCERIN, ondansetron **OR** ondansetron (ZOFRAN) IV, senna-docusate, traMADol   Vital Signs    Vitals:   01/24/17 1158 01/24/17 2024 01/25/17 0433 01/25/17 0810  BP: (!) 131/36 (!) 127/51 123/68 (!) 134/47  Pulse: 69 70 70 69  Resp: 18 18 18 17   Temp: 98.2 F (36.8 C) 99.1 F (37.3 C) 97.8 F (36.6 C) 97.9 F (36.6 C)  TempSrc: Oral Oral Oral Oral  SpO2: 97% 95% 97% 95%  Weight:   188 lb 11.2 oz (85.6 kg)   Height:        Intake/Output Summary (Last 24 hours) at 01/25/17 1003 Last data filed at 01/25/17 0933  Gross per 24 hour  Intake              490 ml  Output              375 ml  Net              115 ml   Filed Weights   01/23/17 1134 01/25/17 0433  Weight: 185 lb (83.9 kg) 188 lb 11.2 oz (85.6 kg)    Physical Exam   GEN: Well nourished, well developed, in no acute distress.  HEENT: Grossly normal.  Neck: Supple, JVP ~ 14, no carotid bruits, or masses. Cardiac: RRR, 2/6 syst murmur throughout, no rubs, or gallops. No clubbing, cyanosis, edema.  Radials/DP/PT 2+ and equal bilaterally.  Respiratory:  Respirations regular and unlabored, bibasilar crackles - diminished L base. GI: Soft, nontender, nondistended, BS + x  4. MS: no deformity or atrophy. Skin: warm and dry, no rash. Neuro:  Strength and sensation are intact. Psych: AAOx3.  Normal affect.  Labs    Chemistry  Recent Labs Lab 01/23/17 1121 01/24/17 1120 01/25/17 0548  NA 133* 132* 135  K 5.3* 5.9* 4.9  CL 99* 98* 100*  CO2 20* 23 24  GLUCOSE 162* 161* 112*  BUN 85* 93* 99*  CREATININE 3.39* 3.87* 3.81*  CALCIUM 9.1 8.8* 8.3*  PROT 7.6  --   --   ALBUMIN 3.8  --  3.3*  AST 26  --   --   ALT 14  --   --   ALKPHOS 53  --   --   BILITOT 1.8*  --   --   GFRNONAA 12* 10* 10*  GFRAA 14* 12* 12*  ANIONGAP 14 11 11      Hematology  Recent Labs Lab 01/23/17 1121 01/24/17 1120 01/25/17 0548  WBC 18.9* 19.4* 10.7  RBC 3.25* 2.95* 2.79*  HGB 8.5* 7.7* 7.4*  HCT 26.8* 23.6* 22.5*  MCV 82.5 80.0 80.5  MCH 26.2 26.2 26.4  MCHC 31.8* 32.7 32.8  RDW 17.4* 17.3* 17.5*  PLT 309 277 242    Cardiac Enzymes  Recent Labs Lab 01/23/17 1121  TROPONINI 0.04*     Radiology    Dg Chest 2 View  Result Date: 01/23/2017 CLINICAL DATA:  Cough and chest congestion. History of asthma -COPD, coronary artery disease, lung malignancy, elevated white blood cell count currently. EXAM: CHEST  2 VIEW COMPARISON:  PA and lateral chest x-ray of Jan 12, 2017 FINDINGS: The right lung is well-expanded. A stable tiny pleural effusion is observed. There is stable density in the right apex radiating toward the right hilum. There are stable surgical clips here. On the left there is increased pleural fluid at the lung base. The cardiac silhouette is enlarged. The pulmonary vascularity is not clearly engorged. The ICD is in stable position. There is calcification in the wall of the aortic arch. The observed bony thorax exhibits no acute abnormality. IMPRESSION: Increased volume of pleural fluid on the left. Stable tiny right pleural effusion. Stable density in the right pulmonary apex. Stable cardiomegaly without pulmonary vascular congestion. No acute  pneumonia is observed. Thoracic aortic atherosclerosis. Electronically Signed   By: David  Martinique M.D.   On: 01/23/2017 14:05   Ct Chest Wo Contrast  Result Date: 01/24/2017 CLINICAL DATA:  Acute respiratory failure with hypoxia, shortness of breath, pneumonia. EXAM: CT CHEST WITHOUT CONTRAST TECHNIQUE: Multidetector CT imaging of the chest was performed following the standard protocol without IV contrast. COMPARISON:  03/07/2016 and 06/08/2015. FINDINGS: Cardiovascular: Atherosclerotic calcification of the arterial vasculature, including three-vessel involvement of the coronary arteries. Heart is enlarged. Moderate to large pericardial effusion, new. Mediastinum/Nodes: Mediastinal lymph nodes measure up to 10 mm in the low right paratracheal station. Hilar regions are difficult to evaluate without IV contrast. No axillary adenopathy. Esophagus is grossly unremarkable. Lungs/Pleura: Post treatment scarring and fiducial markers in the right upper lobe, stable. Scattered subpleural nodularity measures up to 4 mm, unchanged. There may be mild basilar septal thickening. Small right pleural effusion and moderate left pleural effusion with compressive volume loss in the left lower lobe. Mild mosaic pulmonary parenchymal attenuation, nonspecific. Airway is unremarkable. Upper Abdomen: Visualized portion of the liver is unremarkable. 2.7 cm fluid density nodule in the right adrenal gland, stable. Subcentimeter lesions in the kidneys are too small to characterize. Spleen is enlarged and contains somewhat ill-defined low-attenuation lesions measuring up to 5.9 cm, unchanged from prior exams and likely cysts. Periportal lymph nodes measure up to 12 mm. Musculoskeletal: No worrisome lytic or sclerotic lesions. Degenerative changes in the spine. Mild T8 compression, remote. IMPRESSION: 1. Moderate to large pericardial effusion, new. 2. Probable congestive heart failure. 3. Aortic atherosclerosis (ICD10-170.0). Three-vessel  coronary artery calcification. 4. Right adrenal adenoma. 5. Splenomegaly. Electronically Signed   By: Lorin Picket M.D.   On: 01/24/2017 16:38    Telemetry    V paced - Personally Reviewed  Cardiac Studies   2D Echocardiogram 5.19.2018 Left ventricle: The cavity size was normal. Systolic function was normal. The estimated ejection fraction was in the range of 60% to 65%. Wall motion was normal; there were no regional wall motion abnormalities. The study is not technically sufficient to allow evaluation of LV diastolic function. - Ventricular septum: Septal motion showed paradox. These changes are consistent with right ventricular pacing. - Aortic valve: There was mild stenosis. Valve area (Vmax): 1.8 cm^2. - Mitral valve: Calcified annulus. - Left atrium: The atrium was mildly dilated. - Pulmonary arteries: Systolic pressure  was mildly increased. PA peak pressure: 40 mm Hg (S). - Pericardium, extracardiac: A small, free-flowing pericardial effusion was identified circumferential to the heart. The fluid had no internal echoes.  Impressions:  - Markedly improved LV systolic function since August 2017.  Patient Profile     Regina Burke a 83 y.o.femalewith a history of CAD s/p prior NSTEMI and PCI/DES of the OM3 (11/2014), chronic combined CHF (EF 30-35% 03/2016), CKD IV, COPD, HTN, HL, DMII, persistent AFib s/p AVN RFCA & BiV PPM placement, COPD, IDA due to chronic blood loss, OSA, and gout, admitted 5/29 with anemia, leukocytosis, and dyspnea.  Assessment & Plan    1.  Chronic anemia/melena:  Pt admitted 5/29 w/ anemia and leukocytosis.  H/H has since trended down  7.4/22.5 this am.  Seen by GI as outpt on 5/29.  Rec inpt GI eval and transfusion.  Suspect anemia is major contributor to ongoing dyspnea.  2:  Acute on chronic resp failure:  Multifactorial in setting of COPD on home O2, diast CHF, L pl effusion, CHF, and progressive anemia.  Transfuse as  above.  Could consider L thoracentesis.  Evidence of volume overload on exam this am  d/c IVF.  Resume diuretics per nephrology (HD being considered).  Transfuse as anemia also contributing - defer to IM.  3.  Chronic Diastolic CHF:  Lasix on hold in setting of AKI.  Creat sl better but volume increasing.  Nephrology to manage diuretics.  HD being considered.  4.  Pericardial Effusion: small on last echo.  Limited echo pending.  5.  Acute on chronic stage IV kidney dzs:  Per nephrology.  6.  GERD/PUD: With progressive anemia.  Rec GI eval and transfusion.  7.  Leukocytosis:  WBC nl.  8.  Demand Ischemia/elevated troponin:  In setting of all of the above.  This does not represent ACS.  Cont  blocker.  Signed, Murray Hodgkins, NP  01/25/2017, 10:03 AM

## 2017-01-25 NOTE — Progress Notes (Signed)
PT Cancellation Note  Patient Details Name: Regina Burke MRN: [id number] DOB: 1933/03/15   Cancelled Treatment:    Reason Eval/Treat Not Completed: Patient at procedure or test/unavailable (Evaluation re-attempted.  Patient currently off unit for diagnostic testing.  Will re-attempt at later time/date as medically appropriate and available.)   Sandford Diop H. Owens Shark, PT, DPT, NCS 01/25/17, 11:45 AM [phone]

## 2017-01-25 NOTE — Consult Note (Signed)
[facility] Vascular Consult Note  MRN : [id number]  Regina Burke is a 83 y.o. (02-11-1933) female who presents with chief complaint of  Chief Complaint  Patient presents with  . Shortness of Breath  .  History of Present Illness: Patient is admitted to the hospital with CHF and SOB and has developed acute renal failure.  The patient reports Feeling lousy. She has shortness of breath at rest and is anemic.  The patient is critically ill with a BUN approaching 100 and congestive heart failure. She denies fever or chills. Positive for fatigue and weakness. She also has hyperkalemia because of her renal insufficiency. The nephrology service has decided to initiate dialysis at this time, and we are asked to place a temporary dialysis catheter for immediate dialysis use.    Current Facility-Administered Medications  Medication Dose Route Frequency Provider Last Rate Last Dose  . acetaminophen (TYLENOL) tablet 650 mg  650 mg Oral Q6H PRN Fritzi Mandes, MD       Or  . acetaminophen (TYLENOL) suppository 650 mg  650 mg Rectal Q6H PRN Fritzi Mandes, MD      . ALPRAZolam Duanne Moron) tablet 0.5 mg  0.5 mg Oral QHS Fritzi Mandes, MD   0.5 mg at 01/23/17 2119  . atorvastatin (LIPITOR) tablet 20 mg  20 mg Oral QPM Fritzi Mandes, MD   20 mg at 01/24/17 1812  . cholecalciferol (VITAMIN D) tablet 400 Units  400 Units Oral Daily Fritzi Mandes, MD   400 Units at 01/25/17 [phone]  . fenofibrate tablet 54 mg  54 mg Oral Daily Fritzi Mandes, MD   54 mg at 01/25/17 4403  . ferrous sulfate tablet 325 mg  325 mg Oral Q breakfast Fritzi Mandes, MD   325 mg at 01/25/17 0813  . fluticasone furoate-vilanterol (BREO ELLIPTA) 100-25 MCG/INH 1 puff  1 puff Inhalation Daily PRN Fritzi Mandes, MD      . insulin aspart (novoLOG) injection 0-9 Units  0-9 Units Subcutaneous TID WC Fritzi Mandes, MD   2 Units at 01/25/17 1245  . multivitamin-lutein (OCUVITE-LUTEIN) capsule 1 capsule  1 capsule Oral Daily Fritzi Mandes, MD   1  capsule at 01/25/17 4742  . nitroGLYCERIN (NITROSTAT) SL tablet 0.4 mg  0.4 mg Sublingual Q5 min PRN Fritzi Mandes, MD      . ondansetron [facility]) tablet 4 mg  4 mg Oral Q6H PRN Fritzi Mandes, MD       Or  . ondansetron [facility]) injection 4 mg  4 mg Intravenous Q6H PRN Fritzi Mandes, MD      . pantoprazole (PROTONIX) EC tablet 40 mg  40 mg Oral BID Fritzi Mandes, MD   40 mg at 01/25/17 5956  . senna-docusate (Senokot-S) tablet 1 tablet  1 tablet Oral QHS PRN Fritzi Mandes, MD      . sertraline (ZOLOFT) tablet 50 mg  50 mg Oral Daily Fritzi Mandes, MD   50 mg at 01/25/17 3875  . tiotropium (SPIRIVA) inhalation capsule 18 mcg  18 mcg Inhalation Daily Fritzi Mandes, MD   18 mcg at 01/25/17 6433  . traMADol (ULTRAM) tablet 50 mg  50 mg Oral Q12H PRN Vaughan Basta, MD      . vitamin B-12 (CYANOCOBALAMIN) tablet 500 mcg  500 mcg Oral Daily Fritzi Mandes, MD   500 mcg at 01/25/17 2951    Past Medical History:  Diagnosis Date  . Anemia   . Arthritis   . Asthma   . Cardiac resynchronization therapy  pacemaker (CRT-P) in place    a. 03/31/16:  Medtronic Percepta Quad CRT-P MRI SureScan (serial Number [id number]) device.  . Chronic diastolic CHF (congestive heart failure) ([place])    a. 10/2015 Echo: EF 55-65%, Gr1 DD, mild MR, mildly dil LA, nl RV fxn, nl PASP;  b. 03/2016 Echo: EF 30-35%, mod MR, mod dil LA; c. 12/2016 Echo: EF 60-65%, no rwma, mild AS, mildly dil LA, PASP 28mmHg.  . CKD (chronic kidney disease), stage IV ([place])   . COPD (chronic obstructive pulmonary disease) ([place])   . Coronary artery disease    a. 11/2014 NSTEMI/PCI: LM nl, LAD 10m, D1 30, LCX mild dzs, OM1 20p, OM2 72m, OM3 90p (2.25x8 Promus Premier DES), RCA nl.   . Cough    CHRONIC AT NIGHT  . Cushing's disease ([place])   . Depression   . Diverticulitis   . Edema    FEET/LEGS  . GERD (gastroesophageal reflux disease)   . Gout   . History of hiatal hernia   . History of pneumonia   . HLD (hyperlipidemia)   . HOH (hard of hearing)   .  Hypertensive heart disease   . Lung cancer ([place]) dx'd 2014   S/P radiation 2015  . Migraine   . Mixed Ischemic and Nonischemic Cardiomyopathy ([place])    a. 03/2016 Echo: EF 30-35%;  b. 12/2016 Echo: EF 60-65%.  . Moderate mitral regurgitation   . Multiple allergies   . Myocardial infarction ([place])   . Oxygen deficiency    2LITERS  . Persistent atrial fibrillation ([place])    a. CHADS2VASc ==> 7 (CHF, HTN, age x 2, DM, vascular disease, and gender)--was on renal dosed eliquis but this was d/c'd in 12/2016 in setting of dark stools/anemia;  c. 03/2016 s/p AVN and MDT BiV ICD placement.  . Presence of permanent cardiac pacemaker   . S/P AV nodal ablation    a. on 03/31/16 for persistant afib with CRT-P placement  . Sleep apnea   . Type II diabetes mellitus ([place])     Past Surgical History:  Procedure Laterality Date  . ABDOMINAL HYSTERECTOMY    . ABLATION     July 2017  . ADRENALECTOMY Left 1980's   "[place]"  . APPENDECTOMY    . BREAST CYST EXCISION Left   . CATARACT EXTRACTION W/PHACO Right 12/28/2015   Procedure: CATARACT EXTRACTION PHACO AND INTRAOCULAR LENS PLACEMENT (IOC);  Surgeon: Birder Robson, MD;  Location: [facility] ORS;  Service: Ophthalmology;  Laterality: Right;  [place] 48.4   . CATARACT EXTRACTION W/PHACO Left 11/14/2016   Procedure: CATARACT EXTRACTION PHACO AND INTRAOCULAR LENS PLACEMENT (IOC);  Surgeon: Birder Robson, MD;  Location: [facility] ORS;  Service: Ophthalmology;  Laterality: Left;  [place] 59.8 AP% 18.1 CDE 10.78 Fluid pack lot # [id number]  . CHOLECYSTECTOMY    . CORONARY ANGIOPLASTY WITH STENT PLACEMENT  12/23/2014  . ELECTROPHYSIOLOGIC STUDY N/A 03/08/2016   Procedure: CARDIOVERSION;  Surgeon: Wende Bushy, MD;  Location: [facility] ORS;  Service: Cardiovascular;  Laterality: N/A;  . ELECTROPHYSIOLOGIC STUDY N/A 03/07/2016   Procedure: Cardioversion;  Surgeon: Wende Bushy, MD;  Location: [facility] ORS;  Service: Cardiovascular;  Laterality: N/A;  . ELECTROPHYSIOLOGIC STUDY N/A 03/31/2016    Procedure: AV Node Ablation;  Surgeon: Will Meredith Leeds, MD;  Location: [place] [facility];  Service: Cardiovascular;  Laterality: N/A;  . EP IMPLANTABLE DEVICE N/A 03/31/2016   Procedure: BiV Pacemaker Insertion CRT-P;  Surgeon: Will Meredith Leeds, MD;  Location: [place] [facility];  Service:  Cardiovascular;  Laterality: N/A;  . FRACTURE SURGERY    . INSERT / REPLACE / REMOVE PACEMAKER  02/2016  . LEFT HEART CATHETERIZATION WITH CORONARY ANGIOGRAM N/A 12/23/2014   Procedure: LEFT HEART CATHETERIZATION WITH CORONARY ANGIOGRAM;  Surgeon: Burnell Blanks, MD;  Location: [facility] CATH LAB;  Service: Cardiovascular;  Laterality: N/A;  . PERCUTANEOUS CORONARY STENT INTERVENTION (PCI-S)  12/23/2014   Procedure: PERCUTANEOUS CORONARY STENT INTERVENTION (PCI-S);  Surgeon: Burnell Blanks, MD;  Location: [facility] CATH LAB;  Service: Cardiovascular;;  Promus 2.25x8  . TONSILLECTOMY    . TRANSTHORACIC ECHOCARDIOGRAM  11/26/2015   Technically difficult study. EF 55-60%. Normal wall motion. GR 1 DD.  . TUBAL LIGATION    . WRIST FRACTURE SURGERY Bilateral ~ 2000    Social History Social History  Substance Use Topics  . Smoking status: Former Smoker    Packs/day: 1.00    Years: 45.00    Types: Cigarettes    Quit date: 04/25/1994  . Smokeless tobacco: Never Used  . Alcohol use No     Comment: 12/23/2014 "might have a couple mixed drinks/year"  No IV drug use  Family History Family History  Problem Relation Age of Onset  . Heart disease Mother   . Diabetes Mother   . Osteoarthritis Mother   . Hypertension Mother   . Heart disease Father   . Hypertension Father   . COPD Brother     Allergies  Allergen Reactions  . Ciprofloxacin Shortness Of Breath, Itching and Rash  . Doxycycline Shortness Of Breath, Itching and Rash  . Penicillins Shortness Of Breath, Itching, Rash and Other (See Comments)    Has patient had a PCN reaction causing immediate rash, facial/tongue/throat swelling, SOB or  lightheadedness with hypotension: Yes Has patient had a PCN reaction causing severe rash involving mucus membranes or skin necrosis: No Has patient had a PCN reaction that required hospitalization No Has patient had a PCN reaction occurring within the last 10 years: No If all of the above answers are "NO", then may proceed with Cephalosporin use.  . Sulfa Antibiotics Shortness Of Breath, Itching and Rash  . Latex Itching  . Morphine And Related Itching  . Albuterol Sulfate   . Cefuroxime Rash    Blisters in mouth     REVIEW OF SYSTEMS (Negative unless checked)  Constitutional: [] Weight loss  [] Fever  [] Chills Cardiac: [] Chest pain   [] Chest pressure   [] Palpitations   [] Shortness of breath when laying flat   [x] Shortness of breath at rest   [x] Shortness of breath with exertion. Vascular:  [] Pain in legs with walking   [] Pain in legs at rest   [] Pain in legs when laying flat   [] Claudication   [] Pain in feet when walking  [] Pain in feet at rest  [] Pain in feet when laying flat   [] History of DVT   [] Phlebitis   [x] Swelling in legs   [] Varicose veins   [] Non-healing ulcers Pulmonary:   [] Uses home oxygen   [x] Productive cough   [] Hemoptysis   [] Wheeze  [] COPD   [] Asthma Neurologic:  [] Dizziness  [] Blackouts   [] Seizures   [] History of stroke   [] History of TIA  [] Aphasia   [] Temporary blindness   [] Dysphagia   [] Weakness or numbness in arms   [] Weakness or numbness in legs Musculoskeletal:  [] Arthritis   [] Joint swelling   [] Joint pain   [] Low back pain Hematologic:  [] Easy bruising  [] Easy bleeding   [] Hypercoagulable state   [x] Anemic  [] Hepatitis Gastrointestinal:  []   Blood in stool   [] Vomiting blood  [] Gastroesophageal reflux/heartburn   [] Difficulty swallowing. Genitourinary:  [x] Chronic kidney disease   [] Difficult urination  [] Frequent urination  [] Burning with urination   [] Blood in urine Skin:  [] Rashes   [] Ulcers   [] Wounds Psychological:  [] History of anxiety   []  History of major  depression.      Physical Examination  Vitals:   01/24/17 2024 01/25/17 0433 01/25/17 0810 01/25/17 1256  BP: (!) 127/51 123/68 (!) 134/47 (!) 123/59  Pulse: 70 70 69 70  Resp: 18 18 17 18   Temp: 99.1 F (37.3 C) 97.8 F (36.6 C) 97.9 F (36.6 C) 98.3 F (36.8 C)  TempSrc: Oral Oral Oral Oral  SpO2: 95% 97% 95% 97%  Weight:  85.6 kg (188 lb 11.2 oz)    Height:       Body mass index is 32.39 kg/m. Gen: WD/WN Head: [place]/AT, No temporalis wasting Ear/Nose/Throat: Hearing grossly intact, nares w/o erythema or drainage Eyes: Sclera non-icteric, conjunctiva clear Neck: Supple, no nuchal rigidity.  No JVD.  Pulmonary:  Good air movement, clear to auscultation bilaterally.  Cardiac: Irregularly irregular Gastrointestinal: soft, non-tender/non-distended. No guarding/reflex.  Musculoskeletal: M/S 5/5 throughout.  Extremities without ischemic changes.  No deformity or atrophy. 1-2+ BLE Edema in the lower extremities  Neurologic: awake and alert. Somewhat confused but answers most questions appropriately. Psychiatric: Difficult to assess due to the severity of patient's illness. Dermatologic: No rashes or ulcers noted.         CBC Lab Results  Component Value Date   WBC 10.7 01/25/2017   HGB 7.4 (L) 01/25/2017   HCT 22.5 (L) 01/25/2017   MCV 80.5 01/25/2017   PLT 242 01/25/2017    BMET    Component Value Date/Time   NA 135 01/25/2017 0548   NA 142 04/13/2016 1359   NA 134 (L) 12/09/2014 1325   K 4.9 01/25/2017 0548   K 4.5 12/09/2014 1325   CL 100 (L) 01/25/2017 0548   CL 105 12/09/2014 1325   CO2 24 01/25/2017 0548   CO2 23 12/09/2014 1325   GLUCOSE 112 (H) 01/25/2017 0548   GLUCOSE 181 (H) 12/09/2014 1325   BUN 99 (H) 01/25/2017 0548   BUN 38 (H) 04/13/2016 1359   BUN 31 (H) 12/09/2014 1325   CREATININE 3.81 (H) 01/25/2017 0548   CREATININE 1.27 (H) 12/09/2014 1325   CALCIUM 8.3 (L) 01/25/2017 0548   CALCIUM 8.7 (L) 12/09/2014 1325   GFRNONAA 10 (L)  01/25/2017 0548   GFRNONAA 40 (L) 12/09/2014 1325   GFRAA 12 (L) 01/25/2017 0548   GFRAA 46 (L) 12/09/2014 1325   Estimated Creatinine Clearance: 12.3 mL/min (A) (by C-G formula based on SCr of 3.81 mg/dL (H)).  COAG Lab Results  Component Value Date   INR 1.48 01/23/2017   INR 1.46 03/31/2016   INR 1.76 03/16/2016    Radiology Dg Chest 2 View  Result Date: 01/23/2017 CLINICAL DATA:  Cough and chest congestion. History of asthma -COPD, coronary artery disease, lung malignancy, elevated white blood cell count currently. EXAM: CHEST  2 VIEW COMPARISON:  PA and lateral chest x-ray of Jan 12, 2017 FINDINGS: The right lung is well-expanded. A stable tiny pleural effusion is observed. There is stable density in the right apex radiating toward the right hilum. There are stable surgical clips here. On the left there is increased pleural fluid at the lung base. The cardiac silhouette is enlarged. The pulmonary vascularity is not clearly engorged. The ICD  is in stable position. There is calcification in the wall of the aortic arch. The observed bony thorax exhibits no acute abnormality. IMPRESSION: Increased volume of pleural fluid on the left. Stable tiny right pleural effusion. Stable density in the right pulmonary apex. Stable cardiomegaly without pulmonary vascular congestion. No acute pneumonia is observed. Thoracic aortic atherosclerosis. Electronically Signed   By: David  Martinique M.D.   On: 01/23/2017 14:05   Dg Chest 2 View  Result Date: 01/12/2017 CLINICAL DATA:  Chest pain EXAM: CHEST  2 VIEW COMPARISON:  2020/03/817 FINDINGS: Persistent scarring is noted in the right upper lobe. Surgical clips are noted. Pacing device is again seen and stable. Cardiac shadow remains enlarged. Aortic calcifications are again noted. Chronic blunting of the costophrenic angles is seen stable from the prior exam. Some mild left basilar atelectasis is noted IMPRESSION: Stable small effusions. Mild left basilar  atelectasis. Chronic changes in the right apex. Electronically Signed   By: Inez Catalina M.D.   On: 01/12/2017 09:03   Dg Chest 2 View  Result Date: 12/28/2016 CLINICAL DATA:  Shortness of breath for several days EXAM: CHEST  2 VIEW COMPARISON:  04/01/2016 FINDINGS: Cardiac shadow is mildly enlarged. Pacing device is again seen and stable. Postsurgical changes are noted overlying the right apex. Chronic scarring is noted in the right upper lobe. Improved aeration is noted in the bases bilaterally. Small pleural effusions remain. IMPRESSION: Overall improved aeration.  Small pleural effusions remain. Electronically Signed   By: Inez Catalina M.D.   On: 2020/03/817 10:29   Ct Chest Wo Contrast  Result Date: 01/24/2017 CLINICAL DATA:  Acute respiratory failure with hypoxia, shortness of breath, pneumonia. EXAM: CT CHEST WITHOUT CONTRAST TECHNIQUE: Multidetector CT imaging of the chest was performed following the standard protocol without IV contrast. COMPARISON:  03/07/2016 and 06/08/2015. FINDINGS: Cardiovascular: Atherosclerotic calcification of the arterial vasculature, including three-vessel involvement of the coronary arteries. Heart is enlarged. Moderate to large pericardial effusion, new. Mediastinum/Nodes: Mediastinal lymph nodes measure up to 10 mm in the low right paratracheal station. Hilar regions are difficult to evaluate without IV contrast. No axillary adenopathy. Esophagus is grossly unremarkable. Lungs/Pleura: Post treatment scarring and fiducial markers in the right upper lobe, stable. Scattered subpleural nodularity measures up to 4 mm, unchanged. There may be mild basilar septal thickening. Small right pleural effusion and moderate left pleural effusion with compressive volume loss in the left lower lobe. Mild mosaic pulmonary parenchymal attenuation, nonspecific. Airway is unremarkable. Upper Abdomen: Visualized portion of the liver is unremarkable. 2.7 cm fluid density nodule in the right  adrenal gland, stable. Subcentimeter lesions in the kidneys are too small to characterize. Spleen is enlarged and contains somewhat ill-defined low-attenuation lesions measuring up to 5.9 cm, unchanged from prior exams and likely cysts. Periportal lymph nodes measure up to 12 mm. Musculoskeletal: No worrisome lytic or sclerotic lesions. Degenerative changes in the spine. Mild T8 compression, remote. IMPRESSION: 1. Moderate to large pericardial effusion, new. 2. Probable congestive heart failure. 3. Aortic atherosclerosis (ICD10-170.0). Three-vessel coronary artery calcification. 4. Right adrenal adenoma. 5. Splenomegaly. Electronically Signed   By: Lorin Picket M.D.   On: 01/24/2017 16:38   US Renal  Result Date: 01/25/2017 CLINICAL DATA:  Chronic renal failure with apparent acute exacerbation EXAM: RENAL / URINARY TRACT ULTRASOUND COMPLETE COMPARISON:  Jan 20, 2016 FINDINGS: Right Kidney: Length: 10.6 cm. Echogenicity is increased. There is renal cortical thinning. No perinephric fluid or hydronephrosis visualized. There is a cyst measuring 2.6 x 2.9 x  2.7 cm in the upper to mid portion of the right kidney. There is a 1.6 x 1.7 x 1.2 cm cyst in the right kidney lower pole region. There are subcentimeter cysts in the right kidney as well. No sonographically demonstrable calculus or ureterectasis. Left Kidney: Length: 11.9 cm. Echogenicity is increased. There is renal cortical thinning on the left. No perinephric fluid or hydronephrosis visualized. There is a cystic area in the upper pole region on the left measuring 2.5 x 2.2 x 2.0 cm which does contain internal echoes, similar to previous study. There is a smaller cyst more inferiorly on the left measuring 1.5 x 1.3 x 1.2 cm. Several subcentimeter cysts are noted on the left. No sonographically demonstrable calculus or ureterectasis. Bladder: Appears normal for degree of bladder distention. There is a left pleural effusion. There is a somewhat irregular  anechoic focus in the spleen, stable from previous study. This lesion measures 4.8 x 5.0 cm. IMPRESSION: Kidneys show increased echogenicity and renal cortical thinning consistent with medical renal disease. No obstructing focus in either kidney. There are cysts in each kidney. Note that there is a somewhat complex cyst in the left kidney, unchanged from prior study. Anechoic lesion in the spleen, benign in appearance. Etiology uncertain. Left pleural effusion noted. Electronically Signed   By: Lowella Grip III M.D.   On: 01/25/2017 13:35      Assessment/Plan 1. Renal failure.  BUN approaching 100.  Needs dialysis now 2. CHF. Likely worsened by renal failure 3. A. Fib. Worsening CHF and shortness of breath. 4. Hyperkalemia. Likely from renal issues.  Should be improved with dialysis   We will proceed with temporary dialysis catheter placement at this time.  Risks and benefits discussed with patient and/or family, and the catheter will be placed to allow immediate initiation of dialysis.  If the patient's renal function does not improve throughout the hospital course, we will be happy to place a tunneled dialysis catheter for long term use prior to discharge.     Leotis Pain, MD  01/25/2017 4:49 PM

## 2017-01-25 NOTE — Progress Notes (Signed)
*  PRELIMINARY RESULTS* Echocardiogram 2D Echocardiogram has been performed.  Regina Burke 01/25/2017, 3:17 PM

## 2017-01-25 NOTE — Progress Notes (Signed)
[facility] [place] NAME: Regina Burke    MR#:  [id number]  DATE OF BIRTH:  [birth year]  SUBJECTIVE:  CHIEF COMPLAINT:   Chief Complaint  Patient presents with  . Shortness of Breath      Recent admission for CHF and A. Fib.   Came again with worsening shortness of breath with minimal exertion. Noted to have acute on chronic renal failure.   Hb is low, pt is SOB.    REVIEW OF SYSTEMS:  CONSTITUTIONAL: No fever, Positive fatigue or weakness.  EYES: No blurred or double vPositive for vision.  EARS, NOSE, AND THROAT: No tinnitus or ear pain.  RESPIRATORY: No cough, shortness of breath, wheezing or hemoptysis.  CARDIOVASCULAR: No chest pain, orthopnea, edema.  GASTROINTESTINAL: No nausea, vomiting, diarrhea or abdominal pain.  GENITOURINARY: No dysuria, hematuria.  ENDOCRINE: No polyuria, nocturia,  HEMATOLOGY: No anemia, easy bruising or bleeding SKIN: No rash or lesion. MUSCULOSKELETAL: No joint pain or arthritis.   NEUROLOGIC: No tingling, numbness, weakness.  PSYCHIATRY: No anxiety or depression.   ROS  DRUG ALLERGIES:   Allergies  Allergen Reactions  . Ciprofloxacin Shortness Of Breath, Itching and Rash  . Doxycycline Shortness Of Breath, Itching and Rash  . Penicillins Shortness Of Breath, Itching, Rash and Other (See Comments)    Has patient had a PCN reaction causing immediate rash, facial/tongue/throat swelling, SOB or lightheadedness with hypotension: Yes Has patient had a PCN reaction causing severe rash involving mucus membranes or skin necrosis: No Has patient had a PCN reaction that required hospitalization No Has patient had a PCN reaction occurring within the last 10 years: No If all of the above answers are "NO", then may proceed with Cephalosporin use.  . Sulfa Antibiotics Shortness Of Breath, Itching and Rash  . Latex Itching  . Morphine And Related Itching  . Albuterol Sulfate   . Cefuroxime Rash     Blisters in mouth    VITALS:  Blood pressure (!) 123/59, pulse 70, temperature 98.3 F (36.8 C), temperature source Oral, resp. rate 18, height 5\' 4"  (1.626 m), weight 85.6 kg (188 lb 11.2 oz), SpO2 97 %.  PHYSICAL EXAMINATION:  GENERAL:  83 y.o.-year-old patient lying in the bed with no acute distress.  EYES: Pupils equal, round, reactive to light and accommodation. No scleral icterus. Extraocular muscles intact.  HEENT: Head atraumatic, normocephalic. Oropharynx and nasopharynx clear.  NECK:  Supple, no jugular venous distention. No thyroid enlargement, no tenderness.  LUNGS: Normal breath sounds bilaterally, no wheezing, have some crepitation. No use of accessory muscles of respiration.  CARDIOVASCULAR: S1, S2 normal. No murmurs, rubs, or gallops.  ABDOMEN: Soft, nontender, nondistended. Bowel sounds present. No organomegaly or mass.  EXTREMITIES: No pedal edema, cyanosis, or clubbing.  NEUROLOGIC: Cranial nerves II through XII are intact. Muscle strength 5/5 in all extremities. Sensation intact. Gait not checked.  PSYCHIATRIC: The patient is alert and oriented x 3.  SKIN: No obvious rash, lesion, or ulcer.   Physical Exam LABORATORY PANEL:   CBC  Recent Labs Lab 01/25/17 0548  WBC 10.7  HGB 7.4*  HCT 22.5*  PLT 242   ------------------------------------------------------------------------------------------------------------------  Chemistries   Recent Labs Lab 01/23/17 1121  01/25/17 0548  NA 133*  < > 135  K 5.3*  < > 4.9  CL 99*  < > 100*  CO2 20*  < > 24  GLUCOSE 162*  < > 112*  BUN 85*  < > 99*  CREATININE  3.39*  < > 3.81*  CALCIUM 9.1  < > 8.3*  AST 26  --   --   ALT 14  --   --   ALKPHOS 53  --   --   BILITOT 1.8*  --   --   < > = values in this interval not displayed. ------------------------------------------------------------------------------------------------------------------  Cardiac Enzymes  Recent Labs Lab 01/23/17 1121  TROPONINI 0.04*    ------------------------------------------------------------------------------------------------------------------  RADIOLOGY:  Ct Chest Wo Contrast  Result Date: 01/24/2017 CLINICAL DATA:  Acute respiratory failure with hypoxia, shortness of breath, pneumonia. EXAM: CT CHEST WITHOUT CONTRAST TECHNIQUE: Multidetector CT imaging of the chest was performed following the standard protocol without IV contrast. COMPARISON:  03/07/2016 and 06/08/2015. FINDINGS: Cardiovascular: Atherosclerotic calcification of the arterial vasculature, including three-vessel involvement of the coronary arteries. Heart is enlarged. Moderate to large pericardial effusion, new. Mediastinum/Nodes: Mediastinal lymph nodes measure up to 10 mm in the low right paratracheal station. Hilar regions are difficult to evaluate without IV contrast. No axillary adenopathy. Esophagus is grossly unremarkable. Lungs/Pleura: Post treatment scarring and fiducial markers in the right upper lobe, stable. Scattered subpleural nodularity measures up to 4 mm, unchanged. There may be mild basilar septal thickening. Small right pleural effusion and moderate left pleural effusion with compressive volume loss in the left lower lobe. Mild mosaic pulmonary parenchymal attenuation, nonspecific. Airway is unremarkable. Upper Abdomen: Visualized portion of the liver is unremarkable. 2.7 cm fluid density nodule in the right adrenal gland, stable. Subcentimeter lesions in the kidneys are too small to characterize. Spleen is enlarged and contains somewhat ill-defined low-attenuation lesions measuring up to 5.9 cm, unchanged from prior exams and likely cysts. Periportal lymph nodes measure up to 12 mm. Musculoskeletal: No worrisome lytic or sclerotic lesions. Degenerative changes in the spine. Mild T8 compression, remote. IMPRESSION: 1. Moderate to large pericardial effusion, new. 2. Probable congestive heart failure. 3. Aortic atherosclerosis (ICD10-170.0).  Three-vessel coronary artery calcification. 4. Right adrenal adenoma. 5. Splenomegaly. Electronically Signed   By: Lorin Picket M.D.   On: 01/24/2017 16:38   US Renal  Result Date: 01/25/2017 CLINICAL DATA:  Chronic renal failure with apparent acute exacerbation EXAM: RENAL / URINARY TRACT ULTRASOUND COMPLETE COMPARISON:  Jan 20, 2016 FINDINGS: Right Kidney: Length: 10.6 cm. Echogenicity is increased. There is renal cortical thinning. No perinephric fluid or hydronephrosis visualized. There is a cyst measuring 2.6 x 2.9 x 2.7 cm in the upper to mid portion of the right kidney. There is a 1.6 x 1.7 x 1.2 cm cyst in the right kidney lower pole region. There are subcentimeter cysts in the right kidney as well. No sonographically demonstrable calculus or ureterectasis. Left Kidney: Length: 11.9 cm. Echogenicity is increased. There is renal cortical thinning on the left. No perinephric fluid or hydronephrosis visualized. There is a cystic area in the upper pole region on the left measuring 2.5 x 2.2 x 2.0 cm which does contain internal echoes, similar to previous study. There is a smaller cyst more inferiorly on the left measuring 1.5 x 1.3 x 1.2 cm. Several subcentimeter cysts are noted on the left. No sonographically demonstrable calculus or ureterectasis. Bladder: Appears normal for degree of bladder distention. There is a left pleural effusion. There is a somewhat irregular anechoic focus in the spleen, stable from previous study. This lesion measures 4.8 x 5.0 cm. IMPRESSION: Kidneys show increased echogenicity and renal cortical thinning consistent with medical renal disease. No obstructing focus in either kidney. There are cysts in each kidney. Note  that there is a somewhat complex cyst in the left kidney, unchanged from prior study. Anechoic lesion in the spleen, benign in appearance. Etiology uncertain. Left pleural effusion noted. Electronically Signed   By: Lowella Grip III M.D.   On: 01/25/2017  13:35    ASSESSMENT AND PLAN:   Active Problems:   CHF, acute on chronic (HCC)  * Acute respiratory failure with hypoxia   Multifactorial, likely CHF and COPD, have some pericardial effusion.   The white count and pro-calcitonin is high, patient did complain of shortness of breath.   She is not very sure if she had low-grade fever or not, does not have sputum production.   UA is negative, CT chest is not showing pneumonia, so stopped ABx.  * ac on Chronic diastolic CHF.   Continue monitoring, hold Lasix because of renal failure, appreciated cardiology consult.  * Acute on chronic renal failure, CAD stage III.    Avoid nephrotoxic agents, given gentle IV hydration and follow, nephrologist on the case.   Now due to fluid overload, stopped IV fluids, nephro planning to get trialysis cath and explained pt about possible need of dialysis.  * Hyperkalemia   Give Kayexalate to help. Continue monitoring.   Improved.  * Complain of melena   Hemoglobin appears stable lately, anti-coagulation on hold, continue monitoring.   GEt GI consult, and check stool for guaic.  * Elevated troponin/ demand ischemia   Cardiologist on the case, continue beta blocker and statins.  * COPD   No acute wheezing.   Continue home inhalers.   * Anemia of chronic disease   Transfuse 1  Unit PRBC now as she is symptomatic.  discussed with pt about possible side effects of blood transfusion- including more common like low grade fever to rare and serious like renal and lung involvement and infections.  She understood- and due to necessity of transfusion- agreed to receive the transfusion.  Pt's son was present in room during my visit.  All the records are reviewed and case discussed with Care Management/Social Workerr. Management plans discussed with the patient, family and they are in agreement.  CODE STATUS: DNR.  TOTAL TIME TAKING CARE OF THIS PATIENT: 35 minutes.   POSSIBLE D/C IN 1-2 DAYS,  DEPENDING ON CLINICAL CONDITION.   Vaughan Basta M.D on 01/25/2017   Between 7am to 6pm - Pager - [phone]  After 6pm go to [email or web address] - password [email or web address] [facility]  Office  [phone]  CC: Primary care physician; Tracie Harrier, MD  Note: This dictation was prepared with Dragon dictation along with smaller phrase technology. Any transcriptional errors that result from this process are unintentional.

## 2017-01-25 NOTE — Progress Notes (Signed)
Patient has not voided during shift. Nurse aide encouraged patient to use restroom. Declined due to no urge to void at time. Bladder scan completed. Results 171. MD notified. Acknowledged. No new orders. Will monitor.

## 2017-01-25 NOTE — Progress Notes (Signed)
Consent for blood transfusion obtained by rounding physician earlier today. Typer and screen done. Patient requesting blood transfusion. Ordered 1 unit PRBC over 3 hrs. Hb check after transfusion

## 2017-01-25 NOTE — Progress Notes (Signed)
[facility], [place] 01/25/17  Subjective:  Patient states that she presented to GI office yesterday for chest pain. From there, she was referred to the emergency room for evaluation. Upon admission yesterday, creatinine was noted to have increased to 3.39.    Today patient reports some increased shortness of breath There is difficulty in lab draw BUN and creatinine have increased to 99, 3.8 respectively Hemoglobin down to 7.4 Renal ultrasound is negative for obstruction   Objective:  Vital signs in last 24 hours:  Temp:  [97.8 F (36.6 C)-99.1 F (37.3 C)] 98.3 F (36.8 C) (05/31 1256) Pulse Rate:  [69-70] 70 (05/31 1256) Resp:  [17-18] 18 (05/31 1256) BP: (123-134)/(47-68) 123/59 (05/31 1256) SpO2:  [95 %-97 %] 97 % (05/31 1256) Weight:  [85.6 kg (188 lb 11.2 oz)] 85.6 kg (188 lb 11.2 oz) (05/31 0433)  Weight change: 1.678 kg (3 lb 11.2 oz) Filed Weights   01/23/17 1134 01/25/17 0433  Weight: 83.9 kg (185 lb) 85.6 kg (188 lb 11.2 oz)    Intake/Output:    Intake/Output Summary (Last 24 hours) at 01/25/17 1351 Last data filed at 01/25/17 0933  Gross per 24 hour  Intake              490 ml  Output              375 ml  Net              115 ml     Physical Exam: General: Sitting up in bed, no acute distress   HEENT Moist oral mucous membranes   Neck Supple   Pulm/lungs Left basilar crackles, decreased breath sounds on the right, oxygen supplementation by nasal cannula   CVS/Heart irregular, no rub    Abdomen:  Soft, nontender, nondistended   Extremities: + peripheral edema   Neurologic: Alert, oriented   Skin: No acute rashes           Basic Metabolic Panel:   Recent Labs Lab 01/23/17 1121 01/24/17 1120 01/25/17 0548  NA 133* 132* 135  K 5.3* 5.9* 4.9  CL 99* 98* 100*  CO2 20* 23 24  GLUCOSE 162* 161* 112*  BUN 85* 93* 99*  CREATININE 3.39* 3.87* 3.81*  CALCIUM 9.1 8.8* 8.3*  PHOS  --   --  6.5*     CBC:  Recent  Labs Lab 01/23/17 1121 01/24/17 1120 01/25/17 0548  WBC 18.9* 19.4* 10.7  NEUTROABS 17.7*  --   --   HGB 8.5* 7.7* 7.4*  HCT 26.8* 23.6* 22.5*  MCV 82.5 80.0 80.5  PLT 309 277 242     No results found for: HEPBSAG, HEPBSAB, HEPBIGM    Microbiology:  Recent Results (from the past 240 hour(s))  Culture, blood (Routine x 2)     Status: None (Preliminary result)   Collection Time: 01/23/17 11:21 AM  Result Value Ref Range Status   Specimen Description BLOOD RIGHT HAND  Final   Special Requests   Final    BOTTLES DRAWN AEROBIC ONLY BCHV A ANAEROBIC WAS SENT BUT WAS NOT LABEL   Culture NO GROWTH 2 DAYS  Final   Report Status PENDING  Incomplete  Culture, blood (Routine x 2)     Status: None (Preliminary result)   Collection Time: 01/23/17 11:26 AM  Result Value Ref Range Status   Specimen Description BLOOD RIGHT WRIST  Final   Special Requests BOTTLES DRAWN AEROBIC AND ANAEROBIC BCLV  Final   Culture  NO GROWTH 2 DAYS  Final   Report Status PENDING  Incomplete    Coagulation Studies:  Recent Labs  01/23/17 1121  LABPROT 18.1*  INR 1.48    Urinalysis:  Recent Labs  01/24/17 1624  COLORURINE AMBER*  LABSPEC 1.014  PHURINE 5.0  GLUCOSEU NEGATIVE  HGBUR NEGATIVE  BILIRUBINUR NEGATIVE  KETONESUR NEGATIVE  PROTEINUR NEGATIVE  NITRITE NEGATIVE  LEUKOCYTESUR NEGATIVE      Imaging: Dg Chest 2 View  Result Date: 01/23/2017 CLINICAL DATA:  Cough and chest congestion. History of asthma -COPD, coronary artery disease, lung malignancy, elevated white blood cell count currently. EXAM: CHEST  2 VIEW COMPARISON:  PA and lateral chest x-ray of Jan 12, 2017 FINDINGS: The right lung is well-expanded. A stable tiny pleural effusion is observed. There is stable density in the right apex radiating toward the right hilum. There are stable surgical clips here. On the left there is increased pleural fluid at the lung base. The cardiac silhouette is enlarged. The pulmonary  vascularity is not clearly engorged. The ICD is in stable position. There is calcification in the wall of the aortic arch. The observed bony thorax exhibits no acute abnormality. IMPRESSION: Increased volume of pleural fluid on the left. Stable tiny right pleural effusion. Stable density in the right pulmonary apex. Stable cardiomegaly without pulmonary vascular congestion. No acute pneumonia is observed. Thoracic aortic atherosclerosis. Electronically Signed   By: David  Martinique M.D.   On: 01/23/2017 14:05   Ct Chest Wo Contrast  Result Date: 01/24/2017 CLINICAL DATA:  Acute respiratory failure with hypoxia, shortness of breath, pneumonia. EXAM: CT CHEST WITHOUT CONTRAST TECHNIQUE: Multidetector CT imaging of the chest was performed following the standard protocol without IV contrast. COMPARISON:  03/07/2016 and 06/08/2015. FINDINGS: Cardiovascular: Atherosclerotic calcification of the arterial vasculature, including three-vessel involvement of the coronary arteries. Heart is enlarged. Moderate to large pericardial effusion, new. Mediastinum/Nodes: Mediastinal lymph nodes measure up to 10 mm in the low right paratracheal station. Hilar regions are difficult to evaluate without IV contrast. No axillary adenopathy. Esophagus is grossly unremarkable. Lungs/Pleura: Post treatment scarring and fiducial markers in the right upper lobe, stable. Scattered subpleural nodularity measures up to 4 mm, unchanged. There may be mild basilar septal thickening. Small right pleural effusion and moderate left pleural effusion with compressive volume loss in the left lower lobe. Mild mosaic pulmonary parenchymal attenuation, nonspecific. Airway is unremarkable. Upper Abdomen: Visualized portion of the liver is unremarkable. 2.7 cm fluid density nodule in the right adrenal gland, stable. Subcentimeter lesions in the kidneys are too small to characterize. Spleen is enlarged and contains somewhat ill-defined low-attenuation lesions  measuring up to 5.9 cm, unchanged from prior exams and likely cysts. Periportal lymph nodes measure up to 12 mm. Musculoskeletal: No worrisome lytic or sclerotic lesions. Degenerative changes in the spine. Mild T8 compression, remote. IMPRESSION: 1. Moderate to large pericardial effusion, new. 2. Probable congestive heart failure. 3. Aortic atherosclerosis (ICD10-170.0). Three-vessel coronary artery calcification. 4. Right adrenal adenoma. 5. Splenomegaly. Electronically Signed   By: Lorin Picket M.D.   On: 01/24/2017 16:38   US Renal  Result Date: 01/25/2017 CLINICAL DATA:  Chronic renal failure with apparent acute exacerbation EXAM: RENAL / URINARY TRACT ULTRASOUND COMPLETE COMPARISON:  Jan 20, 2016 FINDINGS: Right Kidney: Length: 10.6 cm. Echogenicity is increased. There is renal cortical thinning. No perinephric fluid or hydronephrosis visualized. There is a cyst measuring 2.6 x 2.9 x 2.7 cm in the upper to mid portion of the right kidney. There  is a 1.6 x 1.7 x 1.2 cm cyst in the right kidney lower pole region. There are subcentimeter cysts in the right kidney as well. No sonographically demonstrable calculus or ureterectasis. Left Kidney: Length: 11.9 cm. Echogenicity is increased. There is renal cortical thinning on the left. No perinephric fluid or hydronephrosis visualized. There is a cystic area in the upper pole region on the left measuring 2.5 x 2.2 x 2.0 cm which does contain internal echoes, similar to previous study. There is a smaller cyst more inferiorly on the left measuring 1.5 x 1.3 x 1.2 cm. Several subcentimeter cysts are noted on the left. No sonographically demonstrable calculus or ureterectasis. Bladder: Appears normal for degree of bladder distention. There is a left pleural effusion. There is a somewhat irregular anechoic focus in the spleen, stable from previous study. This lesion measures 4.8 x 5.0 cm. IMPRESSION: Kidneys show increased echogenicity and renal cortical thinning  consistent with medical renal disease. No obstructing focus in either kidney. There are cysts in each kidney. Note that there is a somewhat complex cyst in the left kidney, unchanged from prior study. Anechoic lesion in the spleen, benign in appearance. Etiology uncertain. Left pleural effusion noted. Electronically Signed   By: Lowella Grip III M.D.   On: 01/25/2017 13:35     Medications:    . ALPRAZolam  0.5 mg Oral QHS  . atorvastatin  20 mg Oral QPM  . cholecalciferol  400 Units Oral Daily  . fenofibrate  54 mg Oral Daily  . ferrous sulfate  325 mg Oral Q breakfast  . insulin aspart  0-9 Units Subcutaneous TID WC  . multivitamin-lutein  1 capsule Oral Daily  . pantoprazole  40 mg Oral BID  . sertraline  50 mg Oral Daily  . tiotropium  18 mcg Inhalation Daily  . vitamin B-12  500 mcg Oral Daily   acetaminophen **OR** acetaminophen, fluticasone furoate-vilanterol, nitroGLYCERIN, ondansetron **OR** ondansetron (ZOFRAN) IV, senna-docusate, traMADol  Assessment/ Plan:  83 y.o.caucasian female with diabetes mellitus type II, coronary artery disease, hypertension, hyperlipidemia, generalized anxiety disorder, gout, anemia, history of lung cancer, COPD, atrial fibrillation, biventricular ICD, fatty liver disease, GERD, osteoporosis   1. ARF on CKD st 4 Baseline creatinine of 2 /GFR 22 noted on 11/14/2006 Acute renal failure is likely multifactorial related to low normal blood pressure, concurrent illness leading to ATN Avoid hypotension Follow BMP daily . Discussed with patient and her renal function has worsened over the posterior days.  BUN and creatinine are critically high.  Discussed obtaining a triple-lumen dialysis catheter which can be used for lab draws, IVs as well as dialysis as needed Her volume status is tenuous, we will try IV Lasix.  If respirations get worse, may need urgent dialysis later today or tomorrow.  We have discussed with Dr. Lucky Cowboy and she is on scheduled for a  temporary dialysis catheter placement today.  2. Anemia of chronic kidney disease and possibly blood loss - was on Eliquis which was stopped as outpatient recently - GI eval pending   3. SOB with left pleural effusion - likely multifactorial with pleural effusions, pericardial effusion and renal failure with a LIMA overload - agree with discontinuing IV fluids - May need a trial of Lasix or hemodialysis provided her blood pressure remains stable.  4. Mild Hyperkalemia - Low potassium diet.     LOS: 2 Fajr Fife 5/31/20181:51 PM  [facility] [place], [place]

## 2017-01-25 NOTE — Progress Notes (Signed)
To ultrasound via bed

## 2017-01-26 ENCOUNTER — Other Ambulatory Visit: Payer: Self-pay | Admitting: Oncology

## 2017-01-26 ENCOUNTER — Encounter
Admission: EM | Disposition: A | Discharge status: Home / Self Care | Payer: Self-pay | Source: Home / Self Care | Attending: Internal Medicine

## 2017-01-26 ENCOUNTER — Inpatient Hospital Stay: Payer: Medicare Other | Admitting: Anesthesiology

## 2017-01-26 ENCOUNTER — Encounter: Payer: Self-pay | Admitting: *Deleted

## 2017-01-26 DIAGNOSIS — K921 Melena: Secondary | ICD-10-CM

## 2017-01-26 DIAGNOSIS — K3189 Other diseases of stomach and duodenum: Secondary | ICD-10-CM

## 2017-01-26 DIAGNOSIS — K922 Gastrointestinal hemorrhage, unspecified: Secondary | ICD-10-CM

## 2017-01-26 HISTORY — PX: ESOPHAGOGASTRODUODENOSCOPY (EGD) WITH PROPOFOL: SHX5813

## 2017-01-26 LAB — CBC
HEMATOCRIT: 26.4 % — AB (ref 35.0–47.0)
Hemoglobin: 8.5 g/dL — ABNORMAL LOW (ref 12.0–16.0)
MCH: 26.5 pg (ref 26.0–34.0)
MCHC: 32.2 g/dL (ref 32.0–36.0)
MCV: 82.2 fL (ref 80.0–100.0)
Platelets: 281 10*3/uL (ref 150–440)
RBC: 3.21 MIL/uL — ABNORMAL LOW (ref 3.80–5.20)
RDW: 17.9 % — ABNORMAL HIGH (ref 11.5–14.5)
WBC: 11.1 10*3/uL — ABNORMAL HIGH (ref 3.6–11.0)

## 2017-01-26 LAB — TYPE AND SCREEN
ABO/RH(D): A POS
ANTIBODY SCREEN: NEGATIVE
Unit division: 0

## 2017-01-26 LAB — BASIC METABOLIC PANEL
Anion gap: 11 (ref 5–15)
BUN: 99 mg/dL — ABNORMAL HIGH (ref 6–20)
CO2: 25 mmol/L (ref 22–32)
CREATININE: 3.49 mg/dL — AB (ref 0.44–1.00)
Calcium: 8.5 mg/dL — ABNORMAL LOW (ref 8.9–10.3)
Chloride: 100 mmol/L — ABNORMAL LOW (ref 101–111)
GFR calc non Af Amer: 11 mL/min — ABNORMAL LOW (ref >60)
GFR, EST AFRICAN AMERICAN: 13 mL/min — AB (ref >60)
GLUCOSE: 126 mg/dL — AB (ref 65–99)
Potassium: 4.7 mmol/L (ref 3.5–5.1)
Sodium: 136 mmol/L (ref 135–145)

## 2017-01-26 LAB — ANA COMPREHENSIVE PANEL
Anti JO-1: <0.2 AI (ref 0.0–0.9)
Chromatin Ab SerPl-aCnc: <0.2 AI (ref 0.0–0.9)
ENA SM Ab Ser-aCnc: <0.2 AI (ref 0.0–0.9)
RIBONUCLEIC PROTEIN: 0.2 AI (ref 0.0–0.9)
ds DNA Ab: 37 IU/mL — ABNORMAL HIGH (ref 0–9)

## 2017-01-26 LAB — GLUCOSE, CAPILLARY
GLUCOSE-CAPILLARY: 103 mg/dL — AB (ref 65–99)
Glucose-Capillary: 110 mg/dL — ABNORMAL HIGH (ref 65–99)
Glucose-Capillary: 127 mg/dL — ABNORMAL HIGH (ref 65–99)
Glucose-Capillary: 134 mg/dL — ABNORMAL HIGH (ref 65–99)
Glucose-Capillary: 139 mg/dL — ABNORMAL HIGH (ref 65–99)

## 2017-01-26 LAB — URINE CULTURE: Culture: NO GROWTH

## 2017-01-26 LAB — PROCALCITONIN: PROCALCITONIN: 1.02 ng/mL

## 2017-01-26 LAB — BPAM RBC
BLOOD PRODUCT EXPIRATION DATE: 201806032359
ISSUE DATE / TIME: 201805312031
UNIT TYPE AND RH: 6200

## 2017-01-26 SURGERY — ESOPHAGOGASTRODUODENOSCOPY (EGD) WITH PROPOFOL
Anesthesia: General

## 2017-01-26 MED ORDER — TUBERCULIN PPD 5 UNIT/0.1ML ID SOLN
5.0000 [IU] | Freq: Once | INTRADERMAL | Status: AC
  Administered 2017-01-26: 5 [IU] via INTRADERMAL
  Filled 2017-01-26: qty 0.1

## 2017-01-26 MED ORDER — PROPOFOL 10 MG/ML IV BOLUS
INTRAVENOUS | Status: DC | PRN
  Administered 2017-01-26: 20 mg via INTRAVENOUS
  Administered 2017-01-26: 30 mg via INTRAVENOUS

## 2017-01-26 MED ORDER — LIDOCAINE HCL (PF) 2 % IJ SOLN
INTRAMUSCULAR | Status: AC
Start: 2017-01-26 — End: 2017-01-26
  Filled 2017-01-26: qty 2

## 2017-01-26 MED ORDER — PROPOFOL 500 MG/50ML IV EMUL
INTRAVENOUS | Status: AC
  Filled 2017-01-26: qty 50

## 2017-01-26 MED ORDER — LIDOCAINE HCL (CARDIAC) 20 MG/ML IV SOLN
INTRAVENOUS | Status: DC | PRN
  Administered 2017-01-26: 60 mg via INTRAVENOUS

## 2017-01-26 MED ORDER — SODIUM CHLORIDE 0.9 % IV SOLN
INTRAVENOUS | Status: DC
  Administered 2017-01-26 (×2): via INTRAVENOUS

## 2017-01-26 MED ORDER — FENTANYL CITRATE (PF) 100 MCG/2ML IJ SOLN: 25.0000 ug | INTRAMUSCULAR | Status: DC | PRN

## 2017-01-26 MED ORDER — PROPOFOL 500 MG/50ML IV EMUL
INTRAVENOUS | Status: DC | PRN
  Administered 2017-01-26: 50 ug/kg/min via INTRAVENOUS

## 2017-01-26 MED ORDER — ONDANSETRON HCL 4 MG/2ML IJ SOLN: 4.0000 mg | Freq: Once | INTRAMUSCULAR | Status: DC | PRN

## 2017-01-26 NOTE — Op Note (Signed)
[facility] Patient Name: Regina Burke Procedure Date: 01/26/2017 2:59 PM MRN: [id number] Account #: [id number] Date of Birth: 1932/10/16 Admit Type: Inpatient Age: 83 Room: [facility] ENDO ROOM 1 Gender: Female Note Status: Finalized Procedure:            Upper GI endoscopy Indications:          Melena Providers:            [name] MD, MD Referring MD:         Tracie Harrier, MD (Referring MD) Medicines:            Propofol per Anesthesia Complications:        No immediate complications. Procedure:            Pre-Anesthesia Assessment:                       - Prior to the procedure, a History and Physical was                        performed, and patient medications and allergies were                        reviewed. The patient's tolerance of previous                        anesthesia was also reviewed. The risks and benefits of                        the procedure and the sedation options and risks were                        discussed with the patient. All questions were                        answered, and informed consent was obtained. Prior                        Anticoagulants: The patient has taken no previous                        anticoagulant or antiplatelet agents. ASA Grade                        Assessment: III - A patient with severe systemic                        disease. After reviewing the risks and benefits, the                        patient was deemed in satisfactory condition to undergo                        the procedure.                       After obtaining informed consent, the endoscope was                        passed under direct vision. Throughout the procedure,  the patient's blood pressure, pulse, and oxygen                        saturations were monitored continuously. The Endoscope                        was introduced through the mouth, and advanced to the   third part of duodenum. The upper GI endoscopy was                        accomplished without difficulty. The patient tolerated                        the procedure well. Findings:      The examined esophagus was normal.      A few localized, non-bleeding erosions were found in the gastric body.       There were no stigmata of recent bleeding.      The examined duodenum was normal. Impression:           - Normal esophagus.                       - Non-bleeding erosive gastropathy.                       - Normal examined duodenum.                       - No specimens collected.                       - No old blood or active bleeding seen. Recommendation:       - Return patient to hospital ward for ongoing care. Procedure Code(s):    --- Professional ---                       [phone], Esophagogastroduodenoscopy, flexible, transoral;                        diagnostic, including collection of specimen(s) by                        brushing or washing, when performed (separate procedure) Diagnosis Code(s):    --- Professional ---                       K92.1, Melena (includes Hematochezia)                       K31.89, Other diseases of stomach and duodenum CPT copyright 2016 American Medical Association. All rights reserved. The codes documented in this report are preliminary and upon coder review may  be revised to meet current compliance requirements. [name] MD, MD 01/26/2017 3:15:34 PM This report has been signed electronically. Number of Addenda: 0 Note Initiated On: 01/26/2017 2:59 PM      [facility]

## 2017-01-26 NOTE — Anesthesia Post-op Follow-up Note (Cosign Needed)
Anesthesia QCDR form completed.        

## 2017-01-26 NOTE — Progress Notes (Signed)
Blood drawn from access site per policy. Cap replaced after blood draw per policy. I will continue to assess.

## 2017-01-26 NOTE — Progress Notes (Signed)
[facility], [place] 01/26/17  Subjective:   Patient does not feel well today She is scheduled for upper endoscopy today Received blood transfusion Rt fem dialysis cathter + shortness of breath  Objective:  Vital signs in last 24 hours:  Temp:  [98.3 F (36.8 C)-98.5 F (36.9 C)] 98.4 F (36.9 C) (06/01 0607) Pulse Rate:  [69-77] 77 (06/01 0607) Resp:  [14-18] 18 (06/01 0607) BP: (123-161)/(28-59) 133/44 (06/01 0607) SpO2:  [94 %-97 %] 95 % (06/01 0607) Weight:  [84.8 kg (187 lb)] 84.8 kg (187 lb) (06/01 0923)  Weight change:  Filed Weights   01/23/17 1134 01/25/17 0433 01/26/17 0923  Weight: 83.9 kg (185 lb) 85.6 kg (188 lb 11.2 oz) 84.8 kg (187 lb)    Intake/Output:    Intake/Output Summary (Last 24 hours) at 01/26/17 0814 Last data filed at 01/26/17 4818  Gross per 24 hour  Intake              440 ml  Output              225 ml  Net              215 ml     Physical Exam: General: Laying in bed; sick appearing  HEENT Moist oral mucous membranes   Neck Supple   Pulm/lungs B/l crackles L>Rt; using abdominal muscles, O2 by [place]  CVS/Heart irregular, no rub    Abdomen:  Soft, mild diffuse tenderness, nondistended, decreased BS  Extremities: + peripheral edema   Neurologic: Alert, oriented   Skin: No acute rashes    Rt fem trailysis (dr Dew/ May 31)       Basic Metabolic Panel:   Recent Labs Lab 01/23/17 1121 01/24/17 1120 01/25/17 0548 01/26/17 0418  NA 133* 132* 135 136  K 5.3* 5.9* 4.9 4.7  CL 99* 98* 100* 100*  CO2 20* 23 24 25   GLUCOSE 162* 161* 112* 126*  BUN 85* 93* 99* 99*  CREATININE 3.39* 3.87* 3.81* 3.49*  CALCIUM 9.1 8.8* 8.3* 8.5*  PHOS  --   --  6.5*  --      CBC:  Recent Labs Lab 01/23/17 1121 01/24/17 1120 01/25/17 0548 01/26/17 0418  WBC 18.9* 19.4* 10.7 11.1*  NEUTROABS 17.7*  --   --   --   HGB 8.5* 7.7* 7.4* 8.5*  HCT 26.8* 23.6* 22.5* 26.4*  MCV 82.5 80.0 80.5 82.2  PLT 309 277 242 281      No results found for: HEPBSAG, HEPBSAB, HEPBIGM    Microbiology:  Recent Results (from the past 240 hour(s))  Culture, blood (Routine x 2)     Status: None (Preliminary result)   Collection Time: 01/23/17 11:21 AM  Result Value Ref Range Status   Specimen Description BLOOD RIGHT HAND  Final   Special Requests   Final    BOTTLES DRAWN AEROBIC ONLY BCHV A ANAEROBIC WAS SENT BUT WAS NOT LABEL   Culture NO GROWTH 3 DAYS  Final   Report Status PENDING  Incomplete  Culture, blood (Routine x 2)     Status: None (Preliminary result)   Collection Time: 01/23/17 11:26 AM  Result Value Ref Range Status   Specimen Description BLOOD RIGHT WRIST  Final   Special Requests BOTTLES DRAWN AEROBIC AND ANAEROBIC BCLV  Final   Culture NO GROWTH 3 DAYS  Final   Report Status PENDING  Incomplete  Urine culture     Status: None   Collection Time:  01/24/17  3:24 PM  Result Value Ref Range Status   Specimen Description URINE, RANDOM  Final   Special Requests NONE  Final   Culture   Final    NO GROWTH Performed at [facility], [address] [place], [place] [address]    Report Status 01/26/2017 FINAL  Final    Coagulation Studies:  Recent Labs  01/23/17 1121  LABPROT 18.1*  INR 1.48    Urinalysis:  Recent Labs  01/24/17 1624  COLORURINE AMBER*  LABSPEC 1.014  PHURINE 5.0  GLUCOSEU NEGATIVE  HGBUR NEGATIVE  BILIRUBINUR NEGATIVE  KETONESUR NEGATIVE  PROTEINUR NEGATIVE  NITRITE NEGATIVE  LEUKOCYTESUR NEGATIVE      Imaging: Ct Chest Wo Contrast  Result Date: 01/24/2017 CLINICAL DATA:  Acute respiratory failure with hypoxia, shortness of breath, pneumonia. EXAM: CT CHEST WITHOUT CONTRAST TECHNIQUE: Multidetector CT imaging of the chest was performed following the standard protocol without IV contrast. COMPARISON:  03/07/2016 and 06/08/2015. FINDINGS: Cardiovascular: Atherosclerotic calcification of the arterial vasculature, including three-vessel involvement of the  coronary arteries. Heart is enlarged. Moderate to large pericardial effusion, new. Mediastinum/Nodes: Mediastinal lymph nodes measure up to 10 mm in the low right paratracheal station. Hilar regions are difficult to evaluate without IV contrast. No axillary adenopathy. Esophagus is grossly unremarkable. Lungs/Pleura: Post treatment scarring and fiducial markers in the right upper lobe, stable. Scattered subpleural nodularity measures up to 4 mm, unchanged. There may be mild basilar septal thickening. Small right pleural effusion and moderate left pleural effusion with compressive volume loss in the left lower lobe. Mild mosaic pulmonary parenchymal attenuation, nonspecific. Airway is unremarkable. Upper Abdomen: Visualized portion of the liver is unremarkable. 2.7 cm fluid density nodule in the right adrenal gland, stable. Subcentimeter lesions in the kidneys are too small to characterize. Spleen is enlarged and contains somewhat ill-defined low-attenuation lesions measuring up to 5.9 cm, unchanged from prior exams and likely cysts. Periportal lymph nodes measure up to 12 mm. Musculoskeletal: No worrisome lytic or sclerotic lesions. Degenerative changes in the spine. Mild T8 compression, remote. IMPRESSION: 1. Moderate to large pericardial effusion, new. 2. Probable congestive heart failure. 3. Aortic atherosclerosis (ICD10-170.0). Three-vessel coronary artery calcification. 4. Right adrenal adenoma. 5. Splenomegaly. Electronically Signed   By: Lorin Picket M.D.   On: 01/24/2017 16:38   US Renal  Result Date: 01/25/2017 CLINICAL DATA:  Chronic renal failure with apparent acute exacerbation EXAM: RENAL / URINARY TRACT ULTRASOUND COMPLETE COMPARISON:  Jan 20, 2016 FINDINGS: Right Kidney: Length: 10.6 cm. Echogenicity is increased. There is renal cortical thinning. No perinephric fluid or hydronephrosis visualized. There is a cyst measuring 2.6 x 2.9 x 2.7 cm in the upper to mid portion of the right kidney.  There is a 1.6 x 1.7 x 1.2 cm cyst in the right kidney lower pole region. There are subcentimeter cysts in the right kidney as well. No sonographically demonstrable calculus or ureterectasis. Left Kidney: Length: 11.9 cm. Echogenicity is increased. There is renal cortical thinning on the left. No perinephric fluid or hydronephrosis visualized. There is a cystic area in the upper pole region on the left measuring 2.5 x 2.2 x 2.0 cm which does contain internal echoes, similar to previous study. There is a smaller cyst more inferiorly on the left measuring 1.5 x 1.3 x 1.2 cm. Several subcentimeter cysts are noted on the left. No sonographically demonstrable calculus or ureterectasis. Bladder: Appears normal for degree of bladder distention. There is a left pleural effusion. There is a somewhat  irregular anechoic focus in the spleen, stable from previous study. This lesion measures 4.8 x 5.0 cm. IMPRESSION: Kidneys show increased echogenicity and renal cortical thinning consistent with medical renal disease. No obstructing focus in either kidney. There are cysts in each kidney. Note that there is a somewhat complex cyst in the left kidney, unchanged from prior study. Anechoic lesion in the spleen, benign in appearance. Etiology uncertain. Left pleural effusion noted. Electronically Signed   By: Lowella Grip III M.D.   On: 01/25/2017 13:35     Medications:    . ALPRAZolam  0.5 mg Oral QHS  . atorvastatin  20 mg Oral QPM  . cholecalciferol  400 Units Oral Daily  . fenofibrate  54 mg Oral Daily  . ferrous sulfate  325 mg Oral Q breakfast  . insulin aspart  0-9 Units Subcutaneous TID WC  . multivitamin-lutein  1 capsule Oral Daily  . pantoprazole  40 mg Oral BID  . sertraline  50 mg Oral Daily  . tiotropium  18 mcg Inhalation Daily  . vitamin B-12  500 mcg Oral Daily   acetaminophen **OR** acetaminophen, fluticasone furoate-vilanterol, nitroGLYCERIN, ondansetron **OR** ondansetron (ZOFRAN) IV,  senna-docusate, traMADol  Assessment/ Plan:  83 y.o.caucasian female with diabetes mellitus type II, coronary artery disease, hypertension, hyperlipidemia, generalized anxiety disorder, gout, anemia, history of lung cancer, COPD, atrial fibrillation, biventricular ICD, fatty liver disease, GERD, osteoporosis   1. ARF on CKD st 4 Baseline creatinine of 2 /GFR 22 noted on 11/14/2006 Acute renal failure is likely multifactorial related to low normal blood pressure, concurrent illness leading to ATN  Plan Dialysis today in light of mild uremic Sx and volume overload along with pleural effusions and pericardial effusions Avoid hypotension Follow BMP daily .   2. Anemia of chronic kidney disease and possibly blood loss - was on Eliquis which was stopped as outpatient recently - GI eval ongoing.  3. SOB with left pleural effusion, Mild Pulmonary edema - likely multifactorial with pleural effusions, pericardial effusion and renal failure with volume overload - avoid maintainence IV fluids  4. Mild Hyperkalemia - Low potassium diet.     LOS: 3 [facility] 6/1/20189:52 Russell Ga[place]ra, [place]

## 2017-01-26 NOTE — Progress Notes (Signed)
[facility] [place] NAME: Regina Burke    MR#:  [id number]  DATE OF BIRTH:  06/16/1933  SUBJECTIVE:  CHIEF COMPLAINT:   Chief Complaint  Patient presents with  . Shortness of Breath      Recent admission for CHF and A. Fib.   Came again with worsening shortness of breath with minimal exertion. Noted to have acute on chronic renal failure.   Also had low Hb- given Blood transfusion.   Plan is for EGD today, then will have HD.    REVIEW OF SYSTEMS:  CONSTITUTIONAL: No fever, Positive fatigue or weakness.  EYES: No blurred or double vPositive for vision.  EARS, NOSE, AND THROAT: No tinnitus or ear pain.  RESPIRATORY: No cough, shortness of breath, wheezing or hemoptysis.  CARDIOVASCULAR: No chest pain, orthopnea, edema.  GASTROINTESTINAL: No nausea, vomiting, diarrhea or abdominal pain.  GENITOURINARY: No dysuria, hematuria.  ENDOCRINE: No polyuria, nocturia,  HEMATOLOGY: No anemia, easy bruising or bleeding SKIN: No rash or lesion. MUSCULOSKELETAL: No joint pain or arthritis.   NEUROLOGIC: No tingling, numbness, weakness.  PSYCHIATRY: No anxiety or depression.   ROS  DRUG ALLERGIES:   Allergies  Allergen Reactions  . Ciprofloxacin Shortness Of Breath, Itching and Rash  . Doxycycline Shortness Of Breath, Itching and Rash  . Penicillins Shortness Of Breath, Itching, Rash and Other (See Comments)    Has patient had a PCN reaction causing immediate rash, facial/tongue/throat swelling, SOB or lightheadedness with hypotension: Yes Has patient had a PCN reaction causing severe rash involving mucus membranes or skin necrosis: No Has patient had a PCN reaction that required hospitalization No Has patient had a PCN reaction occurring within the last 10 years: No If all of the above answers are "NO", then may proceed with Cephalosporin use.  . Sulfa Antibiotics Shortness Of Breath, Itching and Rash  . Latex Itching  . Morphine And  Related Itching  . Albuterol Sulfate   . Cefuroxime Rash    Blisters in mouth    VITALS:  Blood pressure (!) 149/63, pulse 69, temperature 97.3 F (36.3 C), temperature source Tympanic, resp. rate (!) 24, height 5\' 4"  (1.626 m), weight 84.8 kg (187 lb), SpO2 99 %.  PHYSICAL EXAMINATION:  GENERAL:  83 y.o.-year-old patient lying in the bed with no acute distress.  EYES: Pupils equal, round, reactive to light and accommodation. No scleral icterus. Extraocular muscles intact.  HEENT: Head atraumatic, normocephalic. Oropharynx and nasopharynx clear.  NECK:  Supple, no jugular venous distention. No thyroid enlargement, no tenderness.  LUNGS: Normal breath sounds bilaterally, no wheezing, have some crepitation. some use of accessory muscles of respiration.  CARDIOVASCULAR: S1, S2 normal. No murmurs, rubs, or gallops.  ABDOMEN: Soft, nontender, nondistended. Bowel sounds present. No organomegaly or mass.  EXTREMITIES: No pedal edema, cyanosis, or clubbing.  NEUROLOGIC: Cranial nerves II through XII are intact. Muscle strength 5/5 in all extremities. Sensation intact. Gait not checked.  PSYCHIATRIC: The patient is alert and oriented x 3.  SKIN: No obvious rash, lesion, or ulcer.   Physical Exam LABORATORY PANEL:   CBC  Recent Labs Lab 01/26/17 0418  WBC 11.1*  HGB 8.5*  HCT 26.4*  PLT 281   ------------------------------------------------------------------------------------------------------------------  Chemistries   Recent Labs Lab 01/23/17 1121  01/26/17 0418  NA 133*  < > 136  K 5.3*  < > 4.7  CL 99*  < > 100*  CO2 20*  < > 25  GLUCOSE 162*  < >  126*  BUN 85*  < > 99*  CREATININE 3.39*  < > 3.49*  CALCIUM 9.1  < > 8.5*  AST 26  --   --   ALT 14  --   --   ALKPHOS 53  --   --   BILITOT 1.8*  --   --   < > = values in this interval not  displayed. ------------------------------------------------------------------------------------------------------------------  Cardiac Enzymes  Recent Labs Lab 01/23/17 1121  TROPONINI 0.04*   ------------------------------------------------------------------------------------------------------------------  RADIOLOGY:  Ct Chest Wo Contrast  Result Date: 01/24/2017 CLINICAL DATA:  Acute respiratory failure with hypoxia, shortness of breath, pneumonia. EXAM: CT CHEST WITHOUT CONTRAST TECHNIQUE: Multidetector CT imaging of the chest was performed following the standard protocol without IV contrast. COMPARISON:  03/07/2016 and 06/08/2015. FINDINGS: Cardiovascular: Atherosclerotic calcification of the arterial vasculature, including three-vessel involvement of the coronary arteries. Heart is enlarged. Moderate to large pericardial effusion, new. Mediastinum/Nodes: Mediastinal lymph nodes measure up to 10 mm in the low right paratracheal station. Hilar regions are difficult to evaluate without IV contrast. No axillary adenopathy. Esophagus is grossly unremarkable. Lungs/Pleura: Post treatment scarring and fiducial markers in the right upper lobe, stable. Scattered subpleural nodularity measures up to 4 mm, unchanged. There may be mild basilar septal thickening. Small right pleural effusion and moderate left pleural effusion with compressive volume loss in the left lower lobe. Mild mosaic pulmonary parenchymal attenuation, nonspecific. Airway is unremarkable. Upper Abdomen: Visualized portion of the liver is unremarkable. 2.7 cm fluid density nodule in the right adrenal gland, stable. Subcentimeter lesions in the kidneys are too small to characterize. Spleen is enlarged and contains somewhat ill-defined low-attenuation lesions measuring up to 5.9 cm, unchanged from prior exams and likely cysts. Periportal lymph nodes measure up to 12 mm. Musculoskeletal: No worrisome lytic or sclerotic lesions. Degenerative  changes in the spine. Mild T8 compression, remote. IMPRESSION: 1. Moderate to large pericardial effusion, new. 2. Probable congestive heart failure. 3. Aortic atherosclerosis (ICD10-170.0). Three-vessel coronary artery calcification. 4. Right adrenal adenoma. 5. Splenomegaly. Electronically Signed   By: Lorin Picket M.D.   On: 01/24/2017 16:38   US Renal  Result Date: 01/25/2017 CLINICAL DATA:  Chronic renal failure with apparent acute exacerbation EXAM: RENAL / URINARY TRACT ULTRASOUND COMPLETE COMPARISON:  Jan 20, 2016 FINDINGS: Right Kidney: Length: 10.6 cm. Echogenicity is increased. There is renal cortical thinning. No perinephric fluid or hydronephrosis visualized. There is a cyst measuring 2.6 x 2.9 x 2.7 cm in the upper to mid portion of the right kidney. There is a 1.6 x 1.7 x 1.2 cm cyst in the right kidney lower pole region. There are subcentimeter cysts in the right kidney as well. No sonographically demonstrable calculus or ureterectasis. Left Kidney: Length: 11.9 cm. Echogenicity is increased. There is renal cortical thinning on the left. No perinephric fluid or hydronephrosis visualized. There is a cystic area in the upper pole region on the left measuring 2.5 x 2.2 x 2.0 cm which does contain internal echoes, similar to previous study. There is a smaller cyst more inferiorly on the left measuring 1.5 x 1.3 x 1.2 cm. Several subcentimeter cysts are noted on the left. No sonographically demonstrable calculus or ureterectasis. Bladder: Appears normal for degree of bladder distention. There is a left pleural effusion. There is a somewhat irregular anechoic focus in the spleen, stable from previous study. This lesion measures 4.8 x 5.0 cm. IMPRESSION: Kidneys show increased echogenicity and renal cortical thinning consistent with medical renal disease. No obstructing focus  in either kidney. There are cysts in each kidney. Note that there is a somewhat complex cyst in the left kidney, unchanged from  prior study. Anechoic lesion in the spleen, benign in appearance. Etiology uncertain. Left pleural effusion noted. Electronically Signed   By: Lowella Grip III M.D.   On: 01/25/2017 13:35    ASSESSMENT AND PLAN:   Active Problems:   CHF, acute on chronic (HCC)  * Acute respiratory failure with hypoxia   Multifactorial, ac CHF and COPD, have some pericardial effusion.   The white count and pro-calcitonin is high, patient did complain of shortness of breath.   She is not very sure if she had low-grade fever or not, does not have sputum production.   UA is negative, CT chest is not showing pneumonia, so stopped ABx.  * ac on Chronic diastolic CHF.   Continue monitoring, hold Lasix because of renal failure, appreciated cardiology consult.   nephro decided to do HD today.  * Acute on chronic renal failure, CAD stage III.    Avoid nephrotoxic agents, given gentle IV hydration and follow, nephrologist on the case.   Now due to fluid overload, stopped IV fluids,     Plan for HD today.  * Hyperkalemia   Give Kayexalate to help. Continue monitoring.   Improved.  * Complain of melena   Hemoglobin appears stable lately, anti-coagulation on hold, continue monitoring.   Appreciated gi consult, plan for endoscope today.  * Elevated troponin/ demand ischemia   Cardiologist on the case, continue beta blocker and statins.  * COPD   No acute wheezing.   Continue home inhalers.   * Anemia of chronic disease   Transfuse 1  Unit PRBC    EGD today/  Pt's son was present in room during my visit.  All the records are reviewed and case discussed with Care Management/Social Workerr. Management plans discussed with the patient, family and they are in agreement.  CODE STATUS: DNR.  TOTAL TIME TAKING CARE OF THIS PATIENT: 35 minutes.   POSSIBLE D/C IN 2-3 DAYS, DEPENDING ON CLINICAL CONDITION.   Vaughan Basta M.D on 01/26/2017   Between 7am to 6pm - Pager - [phone]  After  6pm go to [email or web address] - password [email or web address] [facility]  Office  [phone]  CC: Primary care physician; Tracie Harrier, MD  Note: This dictation was prepared with Dragon dictation along with smaller phrase technology. Any transcriptional errors that result from this process are unintentional.

## 2017-01-26 NOTE — Progress Notes (Signed)
Progress Note  Patient Name: Regina Burke Date of Encounter: 01/26/2017  Primary Cardiologist: Johnny Bridge, MD   Subjective   Feels nauseated this am.  No chest pain or sob.  For EGD today.  Inpatient Medications    Scheduled Meds: . ALPRAZolam  0.5 mg Oral QHS  . atorvastatin  20 mg Oral QPM  . cholecalciferol  400 Units Oral Daily  . fenofibrate  54 mg Oral Daily  . ferrous sulfate  325 mg Oral Q breakfast  . insulin aspart  0-9 Units Subcutaneous TID WC  . multivitamin-lutein  1 capsule Oral Daily  . pantoprazole  40 mg Oral BID  . sertraline  50 mg Oral Daily  . tiotropium  18 mcg Inhalation Daily  . tuberculin  5 Units Intradermal Once  . vitamin B-12  500 mcg Oral Daily   Continuous Infusions:  PRN Meds: acetaminophen **OR** acetaminophen, fluticasone furoate-vilanterol, nitroGLYCERIN, ondansetron **OR** ondansetron (ZOFRAN) IV, senna-docusate, traMADol   Vital Signs    Vitals:   01/25/17 2325 01/26/17 0607 01/26/17 0923 01/26/17 1111  BP: (!) 145/48 (!) 133/44  (!) 156/92  Pulse: 74 77  72  Resp: 18 18  18   Temp: 98.3 F (36.8 C) 98.4 F (36.9 C)  97.6 F (36.4 C)  TempSrc: Oral Oral  Oral  SpO2: 94% 95%  94%  Weight:   187 lb (84.8 kg)   Height:        Intake/Output Summary (Last 24 hours) at 01/26/17 1139 Last data filed at 01/26/17 1028  Gross per 24 hour  Intake              440 ml  Output              225 ml  Net              215 ml   Filed Weights   01/23/17 1134 01/25/17 0433 01/26/17 0923  Weight: 185 lb (83.9 kg) 188 lb 11.2 oz (85.6 kg) 187 lb (84.8 kg)    Physical Exam   GEN: Well nourished, well developed, in no acute distress.  HEENT: Grossly normal.  Neck: Supple, difficult to gauge jvp this am due to positioning and body habitus - neck is full, no carotid bruits, or masses. Cardiac: RRR, 2/6 syst murmur throughout, no rubs, or gallops. No clubbing, cyanosis, edema.  Radials/DP/PT 2+ and equal bilaterally.  Respiratory:   Respirations regular and unlabored, bibasilar crackles. GI: Soft, nontender, nondistended, BS + x 4. MS: no deformity or atrophy. Skin: warm and dry, no rash. Neuro:  Strength and sensation are intact. Psych: AAOx3.  Normal affect.  Labs    Chemistry Recent Labs Lab 01/23/17 1121 01/24/17 1120 01/25/17 0548 01/26/17 0418  NA 133* 132* 135 136  K 5.3* 5.9* 4.9 4.7  CL 99* 98* 100* 100*  CO2 20* 23 24 25   GLUCOSE 162* 161* 112* 126*  BUN 85* 93* 99* 99*  CREATININE 3.39* 3.87* 3.81* 3.49*  CALCIUM 9.1 8.8* 8.3* 8.5*  PROT 7.6  --   --   --   ALBUMIN 3.8  --  3.3*  --   AST 26  --   --   --   ALT 14  --   --   --   ALKPHOS 53  --   --   --   BILITOT 1.8*  --   --   --   GFRNONAA 12* 10* 10* 11*  GFRAA 14* 12* 12* 13*  ANIONGAP 14 11 11 11      Hematology Recent Labs Lab 01/24/17 1120 01/25/17 0548 01/26/17 0418  WBC 19.4* 10.7 11.1*  RBC 2.95* 2.79* 3.21*  HGB 7.7* 7.4* 8.5*  HCT 23.6* 22.5* 26.4*  MCV 80.0 80.5 82.2  MCH 26.2 26.4 26.5  MCHC 32.7 32.8 32.2  RDW 17.3* 17.5* 17.9*  PLT 277 242 281    Cardiac Enzymes Recent Labs Lab 01/23/17 1121  TROPONINI 0.04*    Radiology    Ct Chest Wo Contrast  Result Date: 01/24/2017 CLINICAL DATA:  Acute respiratory failure with hypoxia, shortness of breath, pneumonia. EXAM: CT CHEST WITHOUT CONTRAST TECHNIQUE: Multidetector CT imaging of the chest was performed following the standard protocol without IV contrast. COMPARISON:  03/07/2016 and 06/08/2015. FINDINGS: Cardiovascular: Atherosclerotic calcification of the arterial vasculature, including three-vessel involvement of the coronary arteries. Heart is enlarged. Moderate to large pericardial effusion, new. Mediastinum/Nodes: Mediastinal lymph nodes measure up to 10 mm in the low right paratracheal station. Hilar regions are difficult to evaluate without IV contrast. No axillary adenopathy. Esophagus is grossly unremarkable. Lungs/Pleura: Post treatment scarring and  fiducial markers in the right upper lobe, stable. Scattered subpleural nodularity measures up to 4 mm, unchanged. There may be mild basilar septal thickening. Small right pleural effusion and moderate left pleural effusion with compressive volume loss in the left lower lobe. Mild mosaic pulmonary parenchymal attenuation, nonspecific. Airway is unremarkable. Upper Abdomen: Visualized portion of the liver is unremarkable. 2.7 cm fluid density nodule in the right adrenal gland, stable. Subcentimeter lesions in the kidneys are too small to characterize. Spleen is enlarged and contains somewhat ill-defined low-attenuation lesions measuring up to 5.9 cm, unchanged from prior exams and likely cysts. Periportal lymph nodes measure up to 12 mm. Musculoskeletal: No worrisome lytic or sclerotic lesions. Degenerative changes in the spine. Mild T8 compression, remote. IMPRESSION: 1. Moderate to large pericardial effusion, new. 2. Probable congestive heart failure. 3. Aortic atherosclerosis (ICD10-170.0). Three-vessel coronary artery calcification. 4. Right adrenal adenoma. 5. Splenomegaly. Electronically Signed   By: Lorin Picket M.D.   On: 01/24/2017 16:38   US Renal  Result Date: 01/25/2017 CLINICAL DATA:  Chronic renal failure with apparent acute exacerbation EXAM: RENAL / URINARY TRACT ULTRASOUND COMPLETE COMPARISON:  Jan 20, 2016 FINDINGS: Right Kidney: Length: 10.6 cm. Echogenicity is increased. There is renal cortical thinning. No perinephric fluid or hydronephrosis visualized. There is a cyst measuring 2.6 x 2.9 x 2.7 cm in the upper to mid portion of the right kidney. There is a 1.6 x 1.7 x 1.2 cm cyst in the right kidney lower pole region. There are subcentimeter cysts in the right kidney as well. No sonographically demonstrable calculus or ureterectasis. Left Kidney: Length: 11.9 cm. Echogenicity is increased. There is renal cortical thinning on the left. No perinephric fluid or hydronephrosis visualized. There  is a cystic area in the upper pole region on the left measuring 2.5 x 2.2 x 2.0 cm which does contain internal echoes, similar to previous study. There is a smaller cyst more inferiorly on the left measuring 1.5 x 1.3 x 1.2 cm. Several subcentimeter cysts are noted on the left. No sonographically demonstrable calculus or ureterectasis. Bladder: Appears normal for degree of bladder distention. There is a left pleural effusion. There is a somewhat irregular anechoic focus in the spleen, stable from previous study. This lesion measures 4.8 x 5.0 cm. IMPRESSION: Kidneys show increased echogenicity and renal cortical thinning consistent with medical renal disease. No obstructing focus in either kidney.  There are cysts in each kidney. Note that there is a somewhat complex cyst in the left kidney, unchanged from prior study. Anechoic lesion in the spleen, benign in appearance. Etiology uncertain. Left pleural effusion noted. Electronically Signed   By: Lowella Grip III M.D.   On: 01/25/2017 13:35    Telemetry    V paced, underlying afib/flutter - Personally Reviewed  Cardiac Studies   2D Echocardiogram 5.31.2018  Study Conclusions  - Limited study to evaluate pericardial effusion. - Pericardium, extracardiac: A small to moderate pericardial   effusion was identified posterior to the heart. The appearance is   similar to the prior echo from 01/13/17. The fluid collection is   not accessible from a subxyphoid or apical approach. There was a   left pleural effusion. - Left ventricle: The cavity size was normal. There was mild   concentric hypertrophy. Systolic function was vigorous. The   estimated ejection fraction was in the range of 65% to 70%. - Mitral valve: Calcified annulus. There was mild regurgitation. - Left atrium: The atrium was moderately dilated.  Patient Profile     EMMALENE KATTNER a 83 y.o.femalewith a history of CAD s/p prior NSTEMI and PCI/DES of the OM3 (11/2014), chronic  combined CHF (EF 30-35% 03/2016), CKD IV, COPD, HTN, HL, DMII, persistent AFib s/p AVN RFCA &BiV PPM placement, COPD, IDA due to chronic blood loss, OSA, and gout, admitted 5/29 with anemia, leukocytosis, and dyspnea.  Assessment & Plan    1.  Chronic anemia/melena:  Admitted May 29 with anemia and leukocytosis. Hemoglobin and hematocrit down to 7.4 and 20 2. 5 in the morning of May 31. Now status post 1 unit of packed red blood cells with improvement in H&H. She is for EGD this morning.  2. Acute on chronic respiratory failure: Multifactorial in setting of COPD on home O2, diastolic CHF, left pleural effusion, and progressive anemia. She was transfused yesterday. Breathing stable [place] morning though she continues to have evidence of volume overload. She is being followed by nephrology and dialysis being considered.  3. Chronic diastolic CHF: Lasix has been on hold in the setting of acute kidney injury. She does have some excess volume since admission though somehow her weight is down. Nephrology to manage diuretics versus HD.  4. Pericardial effusion: Small to moderate on limited echo performed May 31. This was similar appearance to prior echo. No indication for pericardiocentesis.  5. Acute and chronic stage IV kidney disease: Creatinine stable [place] morning followed transfusion yesterday. Per nephrology.  6. GERD/peptic ulcer disease: With progressive anemia and reported history of melanoma as an outpatient. For EGD today.  7. Demand ischemia/elevated troponin: In the setting of all of the above. This does not represent ACS. Continue beta blocker.  8. Chronic atrial fibrillation: Eliquis discontinued recently secondary to melena and anemia.  Signed, Murray Hodgkins, NP  01/26/2017, 11:39 AM

## 2017-01-26 NOTE — Care Management (Signed)
Temporary dialysis catheter inserted 5/31 in groin.  At present not completely sure will proceed with any dialysis but line will also be used for IVs and lab draws. do not see orders for hep screen / PPD at present. heads up to Regina Burke with [facility] [employer]

## 2017-01-26 NOTE — Progress Notes (Signed)
Pre dialysis assessment 

## 2017-01-26 NOTE — Progress Notes (Signed)
PT Cancellation  Evaluation re-attempted.  Patient currently off unit for diagnostic procedure (endo) and unavailable for participation with treatment session.  Will re-attempt next date as patient medically appropriate and available.  Kimiye Strathman H. Owens Shark, PT, DPT, NCS 01/26/17, 3:22 PM [phone]

## 2017-01-26 NOTE — Anesthesia Postprocedure Evaluation (Signed)
Anesthesia Post Note  Patient: AISLINN FELIZ  Procedure(s) Performed: Procedure(s) (LRB): ESOPHAGOGASTRODUODENOSCOPY (EGD) WITH PROPOFOL (N/A)  Patient location during evaluation: Endoscopy Anesthesia Type: General Level of consciousness: awake and alert Pain management: pain level controlled Vital Signs Assessment: post-procedure vital signs reviewed and stable Respiratory status: spontaneous breathing and respiratory function stable Cardiovascular status: stable Anesthetic complications: no     Last Vitals:  Vitals:   01/26/17 1528 01/26/17 1538  BP: (!) 145/56 (!) 140/54  Pulse: 70 70  Resp: (!) 23 17  Temp:      Last Pain:  Vitals:   01/26/17 1518  TempSrc: Tympanic  PainSc:                  KEPHART,WILLIAM K

## 2017-01-26 NOTE — Progress Notes (Signed)
HD STARTED  

## 2017-01-26 NOTE — Progress Notes (Signed)
POST DIALYSIS ASSESSMENT 

## 2017-01-26 NOTE — Progress Notes (Signed)
HD COMPLETED  

## 2017-01-26 NOTE — Progress Notes (Signed)
Received one unit of blood this shift with no adverse events. Slept well through the night. Blood drawn from line and sent to lab. Possible dialysis today.

## 2017-01-26 NOTE — Progress Notes (Signed)
TB shot given in the right arm. The left arm is edematous. Test should be read with 48 hours.

## 2017-01-26 NOTE — Anesthesia Preprocedure Evaluation (Signed)
Anesthesia Evaluation  Patient identified by MRN, date of birth, ID band Patient awake    Reviewed: Allergy & Precautions, H&P , NPO status , Patient's Chart, lab work & pertinent test results, reviewed documented beta blocker date and time   History of Anesthesia Complications Negative for: history of anesthetic complications  Airway Mallampati: III  TM Distance: >3 FB Neck ROM: limited    Dental  (+) Poor Dentition   Pulmonary shortness of breath and with exertion, asthma , sleep apnea , pneumonia, resolved, COPD,  COPD inhaler and oxygen dependent, former smoker,    Pulmonary exam normal breath sounds clear to auscultation       Cardiovascular Exercise Tolerance: Poor hypertension, Pt. on medications and Pt. on home beta blockers (-) angina+ CAD, + Past MI, + Cardiac Stents, +CHF and + DOE  Normal cardiovascular exam+ dysrhythmias Atrial Fibrillation + pacemaker + Valvular Problems/Murmurs  Rhythm:regular Rate:Normal     Neuro/Psych  Headaches, PSYCHIATRIC DISORDERS Depression    GI/Hepatic Neg liver ROS, hiatal hernia, GERD  Controlled,  Endo/Other  diabetes, Type 2  Renal/GU CRFRenal disease  negative genitourinary   Musculoskeletal  (+) Arthritis ,   Abdominal   Peds  Hematology negative hematology ROS (+) anemia ,   Anesthesia Other Findings Past Medical History:   COPD (chronic obstructive pulmonary disease) (*              CHF (congestive heart failure) (HCC)                         Hypertension                                                 Rapid palpitations                              2014           Comment:Seen at [facility], may have been atrial fibrillation   DOE (dyspnea on exertion)                                      Comment:Started after treatment for lung cancer   High cholesterol                                             Heart murmur                                                 Myocardial  infarction ([place])                     12/22/2014    Sleep apnea                                                  Cushing's disease ([place])  Type II diabetes mellitus (HCC)                              Anemia                                                       GERD (gastroesophageal reflux disease)                       History of hiatal hernia                                     Migraine                                                     Arthritis                                                    Gout                                                         Depression                                                   Asthma                                                       Dysrhythmia                                                  Coronary artery disease                                      HOH (hard of hearing)                                        Chronic kidney disease (CKD), stage III ([place]er*                Comment:INCREASED KT   Anginal pain ([place])  Cough                                                          Comment:CHRONIC AT NIGHT   Edema                                                          Comment:FEET/LEGS   Wheezing                                                     Pneumonia                                                    Lung cancer ([place])                               dx'd 2014      Comment:S/P radiation 2015   Diverticulitis                                               Multiple allergies                                           Atrial fibrillation ([place])                                   Past Surgical History:   ADRENALECTOMY                                   Left 1980's         Comment:"[place]"   ABDOMINAL HYSTERECTOMY                                        APPENDECTOMY                                                  CHOLECYSTECTOMY                                                CORONARY ANGIOPLASTY WITH STENT PLACEMENT  12/23/2014    TONSILLECTOMY                                                 FRACTURE SURGERY                                              WRIST FRACTURE SURGERY                          Bilateral ~ 2000       BREAST CYST EXCISION                            Left              TUBAL LIGATION                                                LEFT HEART CATHETERIZATION WITH CORONARY ANGIO* N/A 12/23/2014      Comment:Procedure: LEFT HEART CATHETERIZATION WITH               CORONARY ANGIOGRAM;  Surgeon: Burnell Blanks, MD;  Location: [facility] C[facility] LAB;  Service:              Cardiovascular;  Laterality: N/A;   PERCUTANEOUS CORONARY STENT INTERVENTION (PCI-*  12/23/2014      Comment:Procedure: PERCUTANEOUS CORONARY STENT               INTERVENTION (PCI-S);  Surgeon: Burnell Blanks, MD;  Location: [facility] CATH LAB;  Service:              Cardiovascular;;  Promus 2.25x8  BMI    Body Mass Index   29.16 kg/m 2    Patient has cardiac clearance for this procedure.    Reproductive/Obstetrics negative OB ROS                             Anesthesia Physical  Anesthesia Plan  ASA: IV  Anesthesia Plan: General   Post-op Pain Management:    Induction: Intravenous  Airway Management Planned: Nasal Cannula  Additional Equipment:   Intra-op Plan:   Post-operative Plan:   Informed Consent: I have reviewed the patients History and Physical, chart, labs and discussed the procedure including the risks, benefits and alternatives for the proposed anesthesia with the patient or authorized representative who has indicated his/her understanding and acceptance.   Dental Advisory Given  Plan Discussed with: Anesthesiologist, CRNA and Surgeon  Anesthesia Plan Comments:         Anesthesia Quick Evaluation

## 2017-01-26 NOTE — Transfer of Care (Signed)
Immediate Anesthesia Transfer of Care Note  Patient: Regina Burke  Procedure(s) Performed: Procedure(s): ESOPHAGOGASTRODUODENOSCOPY (EGD) WITH PROPOFOL (N/A)  Patient Location: PACU  Anesthesia Type:General  Level of Consciousness: sedated  Airway & Oxygen Therapy: Patient Spontanous Breathing and Patient connected to nasal cannula oxygen  Post-op Assessment: Report given to RN and Post -op Vital signs reviewed and stable  Post vital signs: Reviewed and stable  Last Vitals:  Vitals:   01/26/17 1304 01/26/17 1518  BP: (!) 149/63 (!) 140/46  Pulse: 69 71  Resp: (!) 24 16  Temp: 36.3 C 36.2 C    Last Pain:  Vitals:   01/26/17 1518  TempSrc: Tympanic  PainSc:          Complications: No apparent anesthesia complications

## 2017-01-27 LAB — BASIC METABOLIC PANEL
Anion gap: 10 (ref 5–15)
BUN: 66 mg/dL — AB (ref 6–20)
CO2: 27 mmol/L (ref 22–32)
CREATININE: 2.29 mg/dL — AB (ref 0.44–1.00)
Calcium: 8.3 mg/dL — ABNORMAL LOW (ref 8.9–10.3)
Chloride: 102 mmol/L (ref 101–111)
GFR calc non Af Amer: 19 mL/min — ABNORMAL LOW (ref >60)
GFR, EST AFRICAN AMERICAN: 22 mL/min — AB (ref >60)
GLUCOSE: 111 mg/dL — AB (ref 65–99)
Potassium: 4.3 mmol/L (ref 3.5–5.1)
Sodium: 139 mmol/L (ref 135–145)

## 2017-01-27 LAB — GLUCOSE, CAPILLARY
Glucose-Capillary: 98 mg/dL (ref 65–99)
Glucose-Capillary: 207 mg/dL — ABNORMAL HIGH (ref 65–99)
Glucose-Capillary: 95 mg/dL (ref 65–99)
Glucose-Capillary: 165 mg/dL — ABNORMAL HIGH (ref 65–99)

## 2017-01-27 LAB — CBC
HEMATOCRIT: 25 % — AB (ref 35.0–47.0)
Hemoglobin: 8.3 g/dL — ABNORMAL LOW (ref 12.0–16.0)
MCH: 27.2 pg (ref 26.0–34.0)
MCHC: 33.2 g/dL (ref 32.0–36.0)
MCV: 81.9 fL (ref 80.0–100.0)
Platelets: 258 10*3/uL (ref 150–440)
RBC: 3.05 MIL/uL — ABNORMAL LOW (ref 3.80–5.20)
RDW: 17.8 % — AB (ref 11.5–14.5)
WBC: 8.2 10*3/uL (ref 3.6–11.0)

## 2017-01-27 LAB — MRSA PCR SCREENING: MRSA by PCR: NEGATIVE

## 2017-01-27 MED ORDER — BISACODYL 5 MG PO TBEC
10.0000 mg | DELAYED_RELEASE_TABLET | Freq: Every day | ORAL | Status: AC
  Administered 2017-01-27: 10 mg via ORAL
  Filled 2017-01-27: qty 2

## 2017-01-27 MED ORDER — SODIUM CHLORIDE 0.9% FLUSH
3.0000 mL | Freq: Two times a day (BID) | INTRAVENOUS | Status: DC
  Administered 2017-01-27 – 2017-01-30 (×5): 3 mL via INTRAVENOUS

## 2017-01-27 MED ORDER — DOCUSATE SODIUM 100 MG PO CAPS
200.0000 mg | ORAL_CAPSULE | Freq: Two times a day (BID) | ORAL | Status: DC
  Administered 2017-01-27 – 2017-01-28 (×4): 200 mg via ORAL
  Filled 2017-01-27 (×6): qty 2

## 2017-01-27 MED ORDER — SODIUM CHLORIDE 0.9% FLUSH: 3.0000 mL | INTRAVENOUS | Status: DC | PRN

## 2017-01-27 NOTE — Progress Notes (Signed)
[facility], [place] 01/27/17  Subjective:   Patient  Feels overall improved  1000 cc of fluid was removed with HD Patient tolerated it well  Objective:  Vital signs in last 24 hours:  Temp:  [97.2 F (36.2 C)-98.4 F (36.9 C)] 97.4 F (36.3 C) (06/02 0438) Pulse Rate:  [64-74] 69 (06/02 0438) Resp:  [14-31] 20 (06/02 0438) BP: (113-169)/(46-92) 120/86 (06/02 0438) SpO2:  [96 %-100 %] 100 % (06/02 0438) Weight:  [84.1 kg (185 lb 6.5 oz)-85.7 kg (188 lb 15 oz)] 84.3 kg (185 lb 13.6 oz) (06/02 0500)  Weight change:  Filed Weights   01/26/17 2310 01/27/17 0039 01/27/17 0500  Weight: 84.3 kg (185 lb 13.6 oz) 84.1 kg (185 lb 6.5 oz) 84.3 kg (185 lb 13.6 oz)    Intake/Output:    Intake/Output Summary (Last 24 hours) at 01/27/17 1139 Last data filed at 01/27/17 1009  Gross per 24 hour  Intake              270 ml  Output             1375 ml  Net            -1105 ml     Physical Exam: General: Laying in bed;    HEENT Moist oral mucous membranes   Neck Supple   Pulm/lungs B/l crackles improved from yesterday  CVS/Heart irregular, no rub    Abdomen:  Soft, mild diffuse tenderness, nondistended, decreased BS  Extremities: + peripheral edema   Neurologic: Alert, oriented   Skin: No acute rashes    Rt fem trailysis (dr Dew/ May 31)       Basic Metabolic Panel:   Recent Labs Lab 01/23/17 1121 01/24/17 1120 01/25/17 0548 01/26/17 0418 01/27/17 0526  NA 133* 132* 135 136 139  K 5.3* 5.9* 4.9 4.7 4.3  CL 99* 98* 100* 100* 102  CO2 20* 23 24 25 27   GLUCOSE 162* 161* 112* 126* 111*  BUN 85* 93* 99* 99* 66*  CREATININE 3.39* 3.87* 3.81* 3.49* 2.29*  CALCIUM 9.1 8.8* 8.3* 8.5* 8.3*  PHOS  --   --  6.5*  --   --      CBC:  Recent Labs Lab 01/23/17 1121 01/24/17 1120 01/25/17 0548 01/26/17 0418 01/27/17 0526  WBC 18.9* 19.4* 10.7 11.1* 8.2  NEUTROABS 17.7*  --   --   --   --   HGB 8.5* 7.7* 7.4* 8.5* 8.3*  HCT 26.8* 23.6* 22.5*  26.4* 25.0*  MCV 82.5 80.0 80.5 82.2 81.9  PLT 309 277 242 281 258     No results found for: HEPBSAG, HEPBSAB, HEPBIGM    Microbiology:  Recent Results (from the past 240 hour(s))  Culture, blood (Routine x 2)     Status: None (Preliminary result)   Collection Time: 01/23/17 11:21 AM  Result Value Ref Range Status   Specimen Description BLOOD RIGHT HAND  Final   Special Requests   Final    BOTTLES DRAWN AEROBIC ONLY BCHV A ANAEROBIC WAS SENT BUT WAS NOT LABEL   Culture NO GROWTH 4 DAYS  Final   Report Status PENDING  Incomplete  Culture, blood (Routine x 2)     Status: None (Preliminary result)   Collection Time: 01/23/17 11:26 AM  Result Value Ref Range Status   Specimen Description BLOOD RIGHT WRIST  Final   Special Requests BOTTLES DRAWN AEROBIC AND ANAEROBIC BCLV  Final   Culture NO GROWTH 4  DAYS  Final   Report Status PENDING  Incomplete  Urine culture     Status: None   Collection Time: 01/24/17  3:24 PM  Result Value Ref Range Status   Specimen Description URINE, RANDOM  Final   Special Requests NONE  Final   Culture   Final    NO GROWTH Performed at [facility], [address], [place], [place] [address]    Report Status 01/26/2017 FINAL  Final  MRSA PCR Screening     Status: None   Collection Time: 01/27/17  9:58 AM  Result Value Ref Range Status   MRSA by PCR NEGATIVE NEGATIVE Final    Comment:        The GeneXpert MRSA Assay (FDA approved for NASAL specimens only), is one component of a comprehensive MRSA colonization surveillance program. It is not intended to diagnose MRSA infection nor to guide or monitor treatment for MRSA infections.     Coagulation Studies: No results for input(s): LABPROT, INR in the last 72 hours.  Urinalysis:  Recent Labs  01/24/17 1624  COLORURINE AMBER*  LABSPEC 1.014  PHURINE 5.0  GLUCOSEU NEGATIVE  HGBUR NEGATIVE  BILIRUBINUR NEGATIVE  KETONESUR NEGATIVE  PROTEINUR NEGATIVE  NITRITE NEGATIVE   LEUKOCYTESUR NEGATIVE      Imaging: US Renal  Result Date: 01/25/2017 CLINICAL DATA:  Chronic renal failure with apparent acute exacerbation EXAM: RENAL / URINARY TRACT ULTRASOUND COMPLETE COMPARISON:  Jan 20, 2016 FINDINGS: Right Kidney: Length: 10.6 cm. Echogenicity is increased. There is renal cortical thinning. No perinephric fluid or hydronephrosis visualized. There is a cyst measuring 2.6 x 2.9 x 2.7 cm in the upper to mid portion of the right kidney. There is a 1.6 x 1.7 x 1.2 cm cyst in the right kidney lower pole region. There are subcentimeter cysts in the right kidney as well. No sonographically demonstrable calculus or ureterectasis. Left Kidney: Length: 11.9 cm. Echogenicity is increased. There is renal cortical thinning on the left. No perinephric fluid or hydronephrosis visualized. There is a cystic area in the upper pole region on the left measuring 2.5 x 2.2 x 2.0 cm which does contain internal echoes, similar to previous study. There is a smaller cyst more inferiorly on the left measuring 1.5 x 1.3 x 1.2 cm. Several subcentimeter cysts are noted on the left. No sonographically demonstrable calculus or ureterectasis. Bladder: Appears normal for degree of bladder distention. There is a left pleural effusion. There is a somewhat irregular anechoic focus in the spleen, stable from previous study. This lesion measures 4.8 x 5.0 cm. IMPRESSION: Kidneys show increased echogenicity and renal cortical thinning consistent with medical renal disease. No obstructing focus in either kidney. There are cysts in each kidney. Note that there is a somewhat complex cyst in the left kidney, unchanged from prior study. Anechoic lesion in the spleen, benign in appearance. Etiology uncertain. Left pleural effusion noted. Electronically Signed   By: Lowella Grip III M.D.   On: 01/25/2017 13:35     Medications:    . ALPRAZolam  0.5 mg Oral QHS  . atorvastatin  20 mg Oral QPM  . bisacodyl  10 mg Oral  QHS  . cholecalciferol  400 Units Oral Daily  . docusate sodium  200 mg Oral BID  . fenofibrate  54 mg Oral Daily  . ferrous sulfate  325 mg Oral Q breakfast  . insulin aspart  0-9 Units Subcutaneous TID WC  . multivitamin-lutein  1 capsule Oral Daily  .  pantoprazole  40 mg Oral BID  . sertraline  50 mg Oral Daily  . tiotropium  18 mcg Inhalation Daily  . tuberculin  5 Units Intradermal Once  . vitamin B-12  500 mcg Oral Daily   acetaminophen **OR** acetaminophen, fentaNYL (SUBLIMAZE) injection, fluticasone furoate-vilanterol, nitroGLYCERIN, ondansetron **OR** ondansetron (ZOFRAN) IV, ondansetron (ZOFRAN) IV, senna-docusate, traMADol  Assessment/ Plan:  83 y.o.caucasian female with diabetes mellitus type II, coronary artery disease, hypertension, hyperlipidemia, generalized anxiety disorder, gout, anemia, history of lung cancer, COPD, atrial fibrillation, biventricular ICD, fatty liver disease, GERD, osteoporosis   1. ARF on CKD st 4 Baseline creatinine of 2 /GFR 22 noted on 11/14/2006 Acute renal failure is likely multifactorial related to low normal blood pressure, concurrent illness leading to ATN  Plan -  ARF dialysis 76/1- 1000 cc removed - repeat dialysis today - Goal UF 2000 cc   2. Anemia of chronic kidney disease and possibly blood loss - was on Eliquis which was stopped as outpatient recently - GI eval ongoing. Underwent upper endoscopy 6/1  3. SOB with left pleural effusion, Mild Pulmonary edema - likely multifactorial with pleural effusions, pericardial effusion and renal failure with volume overload - avoid maintainence IV fluids  4. Mild Hyperkalemia - Low potassium diet.     LOS: 4 Abdulloh Ullom 6/2/201811:39 AM  [facility] [place], [place]

## 2017-01-27 NOTE — Progress Notes (Signed)
PT Cancellation Note  Patient Details Name: Regina Burke MRN: [id number] DOB: 26-Feb-1933   Cancelled Treatment:    Reason Eval/Treat Not Completed: Patient at procedure or test/unavailable; Pt at dialyis this pm, also noted pt with femoral dialysis catheter placed 5/31 and per PT protocol mobilization restricted to bed rest.  Will continue to follow as appropriate.  Nash Bolls A Tuck Dulworth, PT 01/27/2017, 5:21 PM

## 2017-01-27 NOTE — Progress Notes (Signed)
HD STARTED  

## 2017-01-27 NOTE — Progress Notes (Signed)
[facility] NAME: Regina Burke    MR#:  [id number]  DATE OF BIRTH:  [birth year]  SUBJECTIVE:  CHIEF COMPLAINT:   Chief Complaint  Patient presents with  . Shortness of Breath    Admitted for acute on chronic hypoxic respiratory failure with CHF and COPD. Also acute kidney injury over CKD. Anemia. EGD showed no bleeding. Mild gastropathy.  Today she feels better after hemodialysis yesterday. Feels weak.    REVIEW OF SYSTEMS:  CONSTITUTIONAL: No fever, Positive fatigue or weakness.  EYES: No blurred or double vPositive for vision.  EARS, NOSE, AND THROAT: No tinnitus or ear pain.  RESPIRATORY: No cough, shortness of breath, wheezing or hemoptysis.  CARDIOVASCULAR: No chest pain, orthopnea, edema.  GASTROINTESTINAL: No nausea, vomiting, diarrhea or abdominal pain.  GENITOURINARY: No dysuria, hematuria.  ENDOCRINE: No polyuria, nocturia,  HEMATOLOGY: No anemia, easy bruising or bleeding SKIN: No rash or lesion. MUSCULOSKELETAL: No joint pain or arthritis.   NEUROLOGIC: No tingling, numbness, weakness.  PSYCHIATRY: No anxiety or depression.   ROS  DRUG ALLERGIES:   Allergies  Allergen Reactions  . Ciprofloxacin Shortness Of Breath, Itching and Rash  . Doxycycline Shortness Of Breath, Itching and Rash  . Penicillins Shortness Of Breath, Itching, Rash and Other (See Comments)    Has patient had a PCN reaction causing immediate rash, facial/tongue/throat swelling, SOB or lightheadedness with hypotension: Yes Has patient had a PCN reaction causing severe rash involving mucus membranes or skin necrosis: No Has patient had a PCN reaction that required hospitalization No Has patient had a PCN reaction occurring within the last 10 years: No If all of the above answers are "NO", then may proceed with Cephalosporin use.  . Sulfa Antibiotics Shortness Of Breath, Itching and Rash  . Latex Itching  . Morphine And Related Itching  .  Albuterol Sulfate   . Cefuroxime Rash    Blisters in mouth    VITALS:  Blood pressure 120/86, pulse 69, temperature 97.4 F (36.3 C), temperature source Oral, resp. rate 20, height 5\' 4"  (1.626 m), weight 84.3 kg (185 lb 13.6 oz), SpO2 100 %.  PHYSICAL EXAMINATION:  GENERAL:  83 y.o.-year-old patient lying in the bed with no acute distress.  EYES: Pupils equal, round, reactive to light and accommodation. No scleral icterus. Extraocular muscles intact.  HEENT: Head atraumatic, normocephalic. Oropharynx and nasopharynx clear.  NECK:  Supple, no jugular venous distention. No thyroid enlargement, no tenderness.  LUNGS: Bibasilar crackles CARDIOVASCULAR: S1, S2 normal. No murmurs, rubs, or gallops.  ABDOMEN: Soft, nontender, nondistended. Bowel sounds present. No organomegaly or mass.  EXTREMITIES: No pedal edema, cyanosis, or clubbing.  NEUROLOGIC: Cranial nerves II through XII are intact. Muscle strength 5/5 in all extremities. Sensation intact. Gait not checked.  PSYCHIATRIC: The patient is alert and oriented x 3.  SKIN: No obvious rash, lesion, or ulcer.  Right femoral temporary dialysis catheter  Physical Exam LABORATORY PANEL:   CBC  Recent Labs Lab 01/27/17 0526  WBC 8.2  HGB 8.3*  HCT 25.0*  PLT 258   ------------------------------------------------------------------------------------------------------------------  Chemistries   Recent Labs Lab 01/23/17 1121  01/27/17 0526  NA 133*  < > 139  K 5.3*  < > 4.3  CL 99*  < > 102  CO2 20*  < > 27  GLUCOSE 162*  < > 111*  BUN 85*  < > 66*  CREATININE 3.39*  < > 2.29*  CALCIUM 9.1  < > 8.3*  AST 26  --   --   ALT 14  --   --   ALKPHOS 53  --   --   BILITOT 1.8*  --   --   < > = values in this interval not displayed. ------------------------------------------------------------------------------------------------------------------  Cardiac Enzymes  Recent Labs Lab 01/23/17 1121  TROPONINI 0.04*    ------------------------------------------------------------------------------------------------------------------  RADIOLOGY:  US Renal  Result Date: 01/25/2017 CLINICAL DATA:  Chronic renal failure with apparent acute exacerbation EXAM: RENAL / URINARY TRACT ULTRASOUND COMPLETE COMPARISON:  Jan 20, 2016 FINDINGS: Right Kidney: Length: 10.6 cm. Echogenicity is increased. There is renal cortical thinning. No perinephric fluid or hydronephrosis visualized. There is a cyst measuring 2.6 x 2.9 x 2.7 cm in the upper to mid portion of the right kidney. There is a 1.6 x 1.7 x 1.2 cm cyst in the right kidney lower pole region. There are subcentimeter cysts in the right kidney as well. No sonographically demonstrable calculus or ureterectasis. Left Kidney: Length: 11.9 cm. Echogenicity is increased. There is renal cortical thinning on the left. No perinephric fluid or hydronephrosis visualized. There is a cystic area in the upper pole region on the left measuring 2.5 x 2.2 x 2.0 cm which does contain internal echoes, similar to previous study. There is a smaller cyst more inferiorly on the left measuring 1.5 x 1.3 x 1.2 cm. Several subcentimeter cysts are noted on the left. No sonographically demonstrable calculus or ureterectasis. Bladder: Appears normal for degree of bladder distention. There is a left pleural effusion. There is a somewhat irregular anechoic focus in the spleen, stable from previous study. This lesion measures 4.8 x 5.0 cm. IMPRESSION: Kidneys show increased echogenicity and renal cortical thinning consistent with medical renal disease. No obstructing focus in either kidney. There are cysts in each kidney. Note that there is a somewhat complex cyst in the left kidney, unchanged from prior study. Anechoic lesion in the spleen, benign in appearance. Etiology uncertain. Left pleural effusion noted. Electronically Signed   By: Lowella Grip III M.D.   On: 01/25/2017 13:35    ASSESSMENT AND  PLAN:   Active Problems:   CHF, acute on chronic (HCC)   Blood in stool   Other diseases of stomach and duodenum  * Acute respiratory failure with hypoxia   Multifactorial,  CHF and COPD.   The white count and pro-calcitonin were high. But afebrile   UA is negative, CT chest is not showing pneumonia, so stopped ABx.  * Acute on Chronic diastolic CHF.   Improving with HD.  * AKI, CAD stage III. Worsening and with uremic symptoms and CHF patient started on hemodialysis on 01/26/2017. Hemodialysis again today. Discussed with Dr. Candiss Norse  * Hyperkalemia   Give Kayexalate to help. Continue monitoring.   Improved.  * Melena   Hemoglobin appears stable  Anti-coagulation on hold, continue monitoring.   Appreciated GI consult Endoscopy - No bleeding  * Elevated troponin due to demand ischemia. Not MI   Cardiologist on the case, continue beta blocker and statins.  * COPD   No acute wheezing.   Continue home inhalers.   * Anemia of chronic disease   Transfused 1  Unit PRBC on 5/31   EGD showed no bleeding. Mild gastropathy  All the records are reviewed and case discussed with Care Management/Social Workerr. Management plans discussed with the patient, family and they are in agreement.  CODE STATUS: DNR.  TOTAL TIME TAKING CARE OF THIS PATIENT: 35 minutes.   POSSIBLE  D/C IN 2-3 DAYS, DEPENDING ON CLINICAL CONDITION.  Hillary Bow R M.D on 01/27/2017   Between 7am to 6pm - Pager - [phone]  After 6pm go to [email or web address] - password [email or web address] [facility]  Office  [phone]  CC: Primary care physician; Tracie Harrier, MD  Note: This dictation was prepared with Dragon dictation along with smaller phrase technology. Any transcriptional errors that result from this process are unintentional.

## 2017-01-27 NOTE — Progress Notes (Signed)
PRE DIALYSIS ASSESSMENT 

## 2017-01-27 NOTE — Progress Notes (Signed)
Slept well through the night. Went to dialysis at beginning of shift. A&O. No complaints. Short of breath with exertion but better after dialysis.

## 2017-01-27 NOTE — Progress Notes (Signed)
Progress Note  Patient Name: Regina Burke Date of Encounter: 01/27/2017  Primary Cardiologist:  Dr. Rockey Situ  Subjective   She is not in pain and says that she is breathing better.  No acute distress  Inpatient Medications    Scheduled Meds: . ALPRAZolam  0.5 mg Oral QHS  . atorvastatin  20 mg Oral QPM  . cholecalciferol  400 Units Oral Daily  . fenofibrate  54 mg Oral Daily  . ferrous sulfate  325 mg Oral Q breakfast  . insulin aspart  0-9 Units Subcutaneous TID WC  . multivitamin-lutein  1 capsule Oral Daily  . pantoprazole  40 mg Oral BID  . sertraline  50 mg Oral Daily  . tiotropium  18 mcg Inhalation Daily  . tuberculin  5 Units Intradermal Once  . vitamin B-12  500 mcg Oral Daily   Continuous Infusions:  PRN Meds: acetaminophen **OR** acetaminophen, fentaNYL (SUBLIMAZE) injection, fluticasone furoate-vilanterol, nitroGLYCERIN, ondansetron **OR** ondansetron (ZOFRAN) IV, ondansetron (ZOFRAN) IV, senna-docusate, traMADol   Vital Signs    Vitals:   01/27/17 0039 01/27/17 0040 01/27/17 0438 01/27/17 0500  BP:  (!) 143/54 120/86   Pulse:  70 69   Resp:  18 20   Temp:  98.3 F (36.8 C) 97.4 F (36.3 C)   TempSrc:  Oral Oral   SpO2:  99% 100%   Weight: 185 lb 6.5 oz (84.1 kg)   185 lb 13.6 oz (84.3 kg)  Height:        Intake/Output Summary (Last 24 hours) at 01/27/17 0858 Last data filed at 01/27/17 0829  Gross per 24 hour  Intake              150 ml  Output             1450 ml  Net            -1300 ml   Filed Weights   01/26/17 2310 01/27/17 0039 01/27/17 0500  Weight: 185 lb 13.6 oz (84.3 kg) 185 lb 6.5 oz (84.1 kg) 185 lb 13.6 oz (84.3 kg)    Telemetry    Paced rhythm - Personally Reviewed  ECG    NA - Personally Reviewed  Physical Exam   GEN: No acute distress.   Neck: No  JVD Cardiac: RRR, positive soft systolic murmur, no diastolic murmurs, rubs, or gallops.  Respiratory:  Decreased breath sounds at the left greater than right base GI:  Soft, nontender, non-distended  MS: No  edema; No deformity. Neuro:  Nonfocal  Psych: Normal affect   Labs    Chemistry Recent Labs Lab 01/23/17 1121  01/25/17 0548 01/26/17 0418 01/27/17 0526  NA 133*  < > 135 136 139  K 5.3*  < > 4.9 4.7 4.3  CL 99*  < > 100* 100* 102  CO2 20*  < > 24 25 27   GLUCOSE 162*  < > 112* 126* 111*  BUN 85*  < > 99* 99* 66*  CREATININE 3.39*  < > 3.81* 3.49* 2.29*  CALCIUM 9.1  < > 8.3* 8.5* 8.3*  PROT 7.6  --   --   --   --   ALBUMIN 3.8  --  3.3*  --   --   AST 26  --   --   --   --   ALT 14  --   --   --   --   ALKPHOS 53  --   --   --   --  BILITOT 1.8*  --   --   --   --   GFRNONAA 12*  < > 10* 11* 19*  GFRAA 14*  < > 12* 13* 22*  ANIONGAP 14  < > 11 11 10   < > = values in this interval not displayed.   Hematology Recent Labs Lab 01/25/17 0548 01/26/17 0418 01/27/17 0526  WBC 10.7 11.1* 8.2  RBC 2.79* 3.21* 3.05*  HGB 7.4* 8.5* 8.3*  HCT 22.5* 26.4* 25.0*  MCV 80.5 82.2 81.9  MCH 26.4 26.5 27.2  MCHC 32.8 32.2 33.2  RDW 17.5* 17.9* 17.8*  PLT 242 281 258    Cardiac Enzymes Recent Labs Lab 01/23/17 1121  TROPONINI 0.04*   No results for input(s): TROPIPOC in the last 168 hours.   BNPNo results for input(s): BNP, PROBNP in the last 168 hours.   DDimer No results for input(s): DDIMER in the last 168 hours.   Radiology    US Renal  Result Date: 01/25/2017 CLINICAL DATA:  Chronic renal failure with apparent acute exacerbation EXAM: RENAL / URINARY TRACT ULTRASOUND COMPLETE COMPARISON:  Jan 20, 2016 FINDINGS: Right Kidney: Length: 10.6 cm. Echogenicity is increased. There is renal cortical thinning. No perinephric fluid or hydronephrosis visualized. There is a cyst measuring 2.6 x 2.9 x 2.7 cm in the upper to mid portion of the right kidney. There is a 1.6 x 1.7 x 1.2 cm cyst in the right kidney lower pole region. There are subcentimeter cysts in the right kidney as well. No sonographically demonstrable calculus or  ureterectasis. Left Kidney: Length: 11.9 cm. Echogenicity is increased. There is renal cortical thinning on the left. No perinephric fluid or hydronephrosis visualized. There is a cystic area in the upper pole region on the left measuring 2.5 x 2.2 x 2.0 cm which does contain internal echoes, similar to previous study. There is a smaller cyst more inferiorly on the left measuring 1.5 x 1.3 x 1.2 cm. Several subcentimeter cysts are noted on the left. No sonographically demonstrable calculus or ureterectasis. Bladder: Appears normal for degree of bladder distention. There is a left pleural effusion. There is a somewhat irregular anechoic focus in the spleen, stable from previous study. This lesion measures 4.8 x 5.0 cm. IMPRESSION: Kidneys show increased echogenicity and renal cortical thinning consistent with medical renal disease. No obstructing focus in either kidney. There are cysts in each kidney. Note that there is a somewhat complex cyst in the left kidney, unchanged from prior study. Anechoic lesion in the spleen, benign in appearance. Etiology uncertain. Left pleural effusion noted. Electronically Signed   By: Lowella Grip III M.D.   On: 01/25/2017 13:35    Cardiac Studies   Study Conclusions  - Limited study to evaluate pericardial effusion. - Pericardium, extracardiac: A small to moderate pericardial effusion was identified posterior to the heart. The appearance is similar to the prior echo from 01/13/17. The fluid collection is not accessible from a subxyphoid or apical approach. There was a left pleural effusion. - Left ventricle: The cavity size was normal. There was mild concentric hypertrophy. Systolic function was vigorous. The estimated ejection fraction was in the range of 65% to 70%. - Mitral valve: Calcified annulus. There was mild regurgitation. - Left atrium: The atrium was moderately dilated.  Patient Profile     83 y.o. female with a history of CAD s/p  prior NSTEMI and PCI/DES of the OM3 (11/2014), chronic combined CHF (EF 30-35% 03/2016), CKD IV, COPD, HTN, HL, DMII, persistent  AFib s/p AVN RFCA &BiV PPM placement, COPD, IDA due to chronic blood loss, OSA, and gout, admitted 5/29 with anemia, leukocytosis, and dyspnea.  Assessment & Plan    ACUTE ON CHRONIC CHF:  Volume per dialysis.  She completed this last night.  Breathing better.    PERICARDIAL EFFUSION:  This has been small.  No change in therapy.  We can follow as she has volume management with dialysis.    STAGE IV CKD:  Status post dialysis last night.    ATRIAL FIB:  Status post AV ablation and BiV PPM.  Still holding Eliquis given anemia.  However, we will need to consider restarting this when OK with GI.    ANEMIA:  Status post EGD.  No source for any recent blood loss noted.    Signed, Minus Breeding, MD  01/27/2017, 8:58 AM

## 2017-01-27 NOTE — Progress Notes (Signed)
HD COMPLETED  

## 2017-01-28 LAB — BASIC METABOLIC PANEL
ANION GAP: 7 (ref 5–15)
BUN: 50 mg/dL — AB (ref 6–20)
CALCIUM: 8.4 mg/dL — AB (ref 8.9–10.3)
CO2: 28 mmol/L (ref 22–32)
CREATININE: 2.18 mg/dL — AB (ref 0.44–1.00)
Chloride: 102 mmol/L (ref 101–111)
GFR calc Af Amer: 23 mL/min — ABNORMAL LOW (ref >60)
GFR, EST NON AFRICAN AMERICAN: 20 mL/min — AB (ref >60)
GLUCOSE: 105 mg/dL — AB (ref 65–99)
Potassium: 4.2 mmol/L (ref 3.5–5.1)
Sodium: 137 mmol/L (ref 135–145)

## 2017-01-28 LAB — HEPATITIS B SURFACE ANTIGEN: Hepatitis B Surface Ag: NEGATIVE

## 2017-01-28 LAB — CULTURE, BLOOD (ROUTINE X 2)
CULTURE: NO GROWTH
Culture: NO GROWTH

## 2017-01-28 LAB — GLUCOSE, CAPILLARY
GLUCOSE-CAPILLARY: 122 mg/dL — AB (ref 65–99)
GLUCOSE-CAPILLARY: 111 mg/dL — AB (ref 65–99)
Glucose-Capillary: 102 mg/dL — ABNORMAL HIGH (ref 65–99)
Glucose-Capillary: 108 mg/dL — ABNORMAL HIGH (ref 65–99)

## 2017-01-28 LAB — HEPATITIS C ANTIBODY: HCV Ab: <0.1 s/co ratio (ref 0.0–0.9)

## 2017-01-28 LAB — HEMOGLOBIN: Hemoglobin: 8.7 g/dL — ABNORMAL LOW (ref 12.0–16.0)

## 2017-01-28 LAB — HEPATITIS B CORE ANTIBODY, TOTAL: Hep B Core Total Ab: NEGATIVE

## 2017-01-28 LAB — PROCALCITONIN: PROCALCITONIN: 0.4 ng/mL

## 2017-01-28 LAB — HEPATITIS B SURFACE ANTIBODY,QUALITATIVE: Hep B S Ab: NONREACTIVE

## 2017-01-28 MED ORDER — EPOETIN ALFA 10000 UNIT/ML IJ SOLN
10000.0000 [IU] | Freq: Once | INTRAMUSCULAR | Status: AC
  Administered 2017-01-28: 10000 [IU] via SUBCUTANEOUS
  Filled 2017-01-28: qty 1

## 2017-01-28 MED ORDER — BISACODYL 5 MG PO TBEC
10.0000 mg | DELAYED_RELEASE_TABLET | Freq: Once | ORAL | Status: AC
  Administered 2017-01-28: 10 mg via ORAL
  Filled 2017-01-28: qty 2

## 2017-01-28 MED ORDER — EPOETIN ALFA 20000 UNIT/ML IJ SOLN
20000.0000 [IU] | Freq: Once | INTRAMUSCULAR | Status: DC
  Filled 2017-01-28: qty 1

## 2017-01-28 NOTE — Progress Notes (Signed)
[facility] [place], [place] 01/28/17  Subjective:   Patient  Feels overall improved  1300 cc of fluid was removed with HD Patient tolerated it well + Ds DNA noted No SOB this morning  Objective:  Vital signs in last 24 hours:  Temp:  [97.7 F (36.5 C)-98.6 F (37 C)] 97.7 F (36.5 C) (06/03 0800) Pulse Rate:  [69-71] 69 (06/03 0800) Resp:  [13-21] 16 (06/03 0800) BP: (112-176)/(42-61) 128/61 (06/03 0800) SpO2:  [96 %-100 %] 100 % (06/03 0800) Weight:  [81.3 kg (179 lb 3.7 oz)-84.4 kg (186 lb 1.1 oz)] 81.3 kg (179 lb 3.7 oz) (06/03 0501)  Weight change: -0.423 kg (-14.9 oz) Filed Weights   01/27/17 1615 01/27/17 1717 01/28/17 0501  Weight: 82.4 kg (181 lb 10.5 oz) 81.6 kg (180 lb) 81.3 kg (179 lb 3.7 oz)    Intake/Output:    Intake/Output Summary (Last 24 hours) at 01/28/17 1012 Last data filed at 01/28/17 0937  Gross per 24 hour  Intake              240 ml  Output             1383 ml  Net            -1143 ml     Physical Exam: General: Laying in bed;    HEENT Moist oral mucous membranes   Neck Supple   Pulm/lungs Clear bilaterally  CVS/Heart irregular, no rub    Abdomen:  Soft,  Nondistended,   Extremities: + peripheral edema   Neurologic: Alert, oriented   Skin: No acute rashes    Rt fem trailysis (dr Dew/ May 31)       Basic Metabolic Panel:   Recent Labs Lab 01/24/17 1120 01/25/17 0548 01/26/17 0418 01/27/17 0526 01/28/17 0518  NA 132* 135 136 139 137  K 5.9* 4.9 4.7 4.3 4.2  CL 98* 100* 100* 102 102  CO2 23 24 25 27 28   GLUCOSE 161* 112* 126* 111* 105*  BUN 93* 99* 99* 66* 50*  CREATININE 3.87* 3.81* 3.49* 2.29* 2.18*  CALCIUM 8.8* 8.3* 8.5* 8.3* 8.4*  PHOS  --  6.5*  --   --   --      CBC:  Recent Labs Lab 01/23/17 1121 01/24/17 1120 01/25/17 0548 01/26/17 0418 01/27/17 0526 01/28/17 0518  WBC 18.9* 19.4* 10.7 11.1* 8.2  --   NEUTROABS 17.7*  --   --   --   --   --   HGB 8.5* 7.7* 7.4* 8.5* 8.3* 8.7*   HCT 26.8* 23.6* 22.5* 26.4* 25.0*  --   MCV 82.5 80.0 80.5 82.2 81.9  --   PLT 309 277 242 281 258  --       Lab Results  Component Value Date   HEPBSAG Negative 01/26/2017   HEPBSAB Non Reactive 01/26/2017      Microbiology:  Recent Results (from the past 240 hour(s))  Culture, blood (Routine x 2)     Status: None   Collection Time: 01/23/17 11:21 AM  Result Value Ref Range Status   Specimen Description BLOOD RIGHT HAND  Final   Special Requests   Final    BOTTLES DRAWN AEROBIC ONLY BCHV A ANAEROBIC WAS SENT BUT WAS NOT LABEL   Culture NO GROWTH 5 DAYS  Final   Report Status 01/28/2017 FINAL  Final  Culture, blood (Routine x 2)     Status: None   Collection Time: 01/23/17 11:26 AM  Result  Value Ref Range Status   Specimen Description BLOOD RIGHT WRIST  Final   Special Requests BOTTLES DRAWN AEROBIC AND ANAEROBIC BCLV  Final   Culture NO GROWTH 5 DAYS  Final   Report Status 01/28/2017 FINAL  Final  Urine culture     Status: None   Collection Time: 01/24/17  3:24 PM  Result Value Ref Range Status   Specimen Description URINE, RANDOM  Final   Special Requests NONE  Final   Culture   Final    NO GROWTH Performed at [facility], [place] [address], [place], [place] [address]    Report Status 01/26/2017 FINAL  Final  MRSA PCR Screening     Status: None   Collection Time: 01/27/17  9:58 AM  Result Value Ref Range Status   MRSA by PCR NEGATIVE NEGATIVE Final    Comment:        The GeneXpert MRSA Assay (FDA approved for NASAL specimens only), is one component of a comprehensive MRSA colonization surveillance program. It is not intended to diagnose MRSA infection nor to guide or monitor treatment for MRSA infections.     Coagulation Studies: No results for input(s): LABPROT, INR in the last 72 hours.  Urinalysis: No results for input(s): COLORURINE, LABSPEC, PHURINE, GLUCOSEU, HGBUR, BILIRUBINUR, KETONESUR, PROTEINUR, UROBILINOGEN, NITRITE, LEUKOCYTESUR in  the last 72 hours.  Invalid input(s): APPERANCEUR    Imaging: No results found.   Medications:    . ALPRAZolam  0.5 mg Oral QHS  . atorvastatin  20 mg Oral QPM  . cholecalciferol  400 Units Oral Daily  . docusate sodium  200 mg Oral BID  . epoetin (EPOGEN/PROCRIT) injection  10,000 Units Subcutaneous Once  . fenofibrate  54 mg Oral Daily  . ferrous sulfate  325 mg Oral Q breakfast  . insulin aspart  0-9 Units Subcutaneous TID WC  . multivitamin-lutein  1 capsule Oral Daily  . pantoprazole  40 mg Oral BID  . sertraline  50 mg Oral Daily  . sodium chloride flush  3 mL Intravenous Q12H  . tiotropium  18 mcg Inhalation Daily  . tuberculin  5 Units Intradermal Once  . vitamin B-12  500 mcg Oral Daily   acetaminophen **OR** acetaminophen, fentaNYL (SUBLIMAZE) injection, fluticasone furoate-vilanterol, nitroGLYCERIN, ondansetron **OR** ondansetron (ZOFRAN) IV, ondansetron (ZOFRAN) IV, senna-docusate, sodium chloride flush, traMADol  Assessment/ Plan:  83 y.o.caucasian female with diabetes mellitus type II, coronary artery disease, hypertension, hyperlipidemia, generalized anxiety disorder, gout, anemia, history of lung cancer, COPD, atrial fibrillation, biventricular ICD, fatty liver disease, GERD, osteoporosis   1. ARF on CKD st 4 Baseline creatinine of 2 /GFR 22 noted on 11/14/2006 Acute renal failure is likely multifactorial related to low normal blood pressure, concurrent illness leading to ATN  Plan -  ARF dialysis 6/1- 1000 cc removed, 6/2 1300 cc removed - Evaluate for dialysis on a daily bases - no recent urine P/C ratio available; outpatient urine P/C ratio was normal at 0.18 in Dec 2017   2. Anemia of chronic kidney disease and possibly blood loss - was on Eliquis which was stopped as outpatient recently - GI eval ongoing. Underwent upper endoscopy 6/1  3. SOB with left pleural effusion, Mild Pulmonary edema - likely multifactorial with pleural effusions, pericardial  effusion and renal failure with volume overload- underlying etiology serositis from Lupus vs Uremia - avoid maintainence IV fluids + dsDNA - recommend Rheumatology consult  4. Mild Hyperkalemia - Low potassium diet.     LOS: 5  [facility] 6/3/201810:12 AM  [place], [place]

## 2017-01-28 NOTE — Progress Notes (Signed)
[facility] NAME: Regina Burke    MR#:  [id number]  DATE OF BIRTH:  08-03-1933  SUBJECTIVE:  CHIEF COMPLAINT:   Chief Complaint  Patient presents with  . Shortness of Breath    Admitted for acute on chronic hypoxic respiratory failure with CHF and COPD. Also acute kidney injury over CKD. Anemia. EGD showed no bleeding. Mild gastropathy.  Improving with HD. UOP only 300 ml last 24 hrs    REVIEW OF SYSTEMS:  CONSTITUTIONAL: No fever, Positive fatigue or weakness.  EYES: No blurred or double vPositive for vision.  EARS, NOSE, AND THROAT: No tinnitus or ear pain.  RESPIRATORY: No cough, shortness of breath, wheezing or hemoptysis.  CARDIOVASCULAR: No chest pain, orthopnea, edema.  GASTROINTESTINAL: No nausea, vomiting, diarrhea or abdominal pain.  GENITOURINARY: No dysuria, hematuria.  ENDOCRINE: No polyuria, nocturia,  HEMATOLOGY: No anemia, easy bruising or bleeding SKIN: No rash or lesion. MUSCULOSKELETAL: No joint pain or arthritis.   NEUROLOGIC: No tingling, numbness, weakness.  PSYCHIATRY: No anxiety or depression.   ROS  DRUG ALLERGIES:   Allergies  Allergen Reactions  . Ciprofloxacin Shortness Of Breath, Itching and Rash  . Doxycycline Shortness Of Breath, Itching and Rash  . Penicillins Shortness Of Breath, Itching, Rash and Other (See Comments)    Has patient had a PCN reaction causing immediate rash, facial/tongue/throat swelling, SOB or lightheadedness with hypotension: Yes Has patient had a PCN reaction causing severe rash involving mucus membranes or skin necrosis: No Has patient had a PCN reaction that required hospitalization No Has patient had a PCN reaction occurring within the last 10 years: No If all of the above answers are "NO", then may proceed with Cephalosporin use.  . Sulfa Antibiotics Shortness Of Breath, Itching and Rash  . Latex Itching  . Morphine And Related Itching  . Albuterol Sulfate    . Cefuroxime Rash    Blisters in mouth    VITALS:  Blood pressure 128/61, pulse 69, temperature 97.7 F (36.5 C), temperature source Oral, resp. rate 16, height 5\' 4"  (1.626 m), weight 81.3 kg (179 lb 3.7 oz), SpO2 100 %.  PHYSICAL EXAMINATION:  GENERAL:  83 y.o.-year-old patient lying in the bed with no acute distress.  EYES: Pupils equal, round, reactive to light and accommodation. No scleral icterus. Extraocular muscles intact.  HEENT: Head atraumatic, normocephalic. Oropharynx and nasopharynx clear.  NECK:  Supple, no jugular venous distention. No thyroid enlargement, no tenderness.  LUNGS: Bibasilar crackles CARDIOVASCULAR: S1, S2 normal. No murmurs, rubs, or gallops.  ABDOMEN: Soft, nontender, nondistended. Bowel sounds present. No organomegaly or mass.  EXTREMITIES: No pedal edema, cyanosis, or clubbing.  NEUROLOGIC: Cranial nerves II through XII are intact. Muscle strength 5/5 in all extremities. Sensation intact. Gait not checked.  PSYCHIATRIC: The patient is alert and oriented x 3.  SKIN: No obvious rash, lesion, or ulcer.  Right femoral temporary dialysis catheter  Physical Exam LABORATORY PANEL:   CBC  Recent Labs Lab 01/27/17 0526 01/28/17 0518  WBC 8.2  --   HGB 8.3* 8.7*  HCT 25.0*  --   PLT 258  --    ------------------------------------------------------------------------------------------------------------------  Chemistries   Recent Labs Lab 01/23/17 1121  01/28/17 0518  NA 133*  < > 137  K 5.3*  < > 4.2  CL 99*  < > 102  CO2 20*  < > 28  GLUCOSE 162*  < > 105*  BUN 85*  < > 50*  CREATININE  3.39*  < > 2.18*  CALCIUM 9.1  < > 8.4*  AST 26  --   --   ALT 14  --   --   ALKPHOS 53  --   --   BILITOT 1.8*  --   --   < > = values in this interval not displayed. ------------------------------------------------------------------------------------------------------------------  Cardiac Enzymes  Recent Labs Lab 01/23/17 1121  TROPONINI 0.04*    ------------------------------------------------------------------------------------------------------------------  RADIOLOGY:  No results found.  ASSESSMENT AND PLAN:   Active Problems:   CHF, acute on chronic (HCC)   Blood in stool   Other diseases of stomach and duodenum  * Acute respiratory failure with hypoxia   Multifactorial,  CHF and COPD.   The white count and pro-calcitonin were high. But afebrile   UA is negative, CT chest is not showing pneumonia, stopped ABx.  * Acute on Chronic diastolic CHF.   Improving with HD.  * AKI, CAD stage III. Worsening and with uremic symptoms and CHF patient started on hemodialysis on 01/26/2017.  Discussed with Dr. Candiss Norse. No HD today  * Hyperkalemia   Give Kayexalate to help. Continue monitoring.   Improved.  * Melena   Hemoglobin appears stable  Anti-coagulation on hold, continue monitoring.   Appreciated GI consult Endoscopy - No bleeding  * Elevated troponin due to demand ischemia. Not MI   Cardiologist on the case, continue beta blocker and statins.  * COPD   No acute wheezing.   Continue home inhalers.   * Anemia of chronic disease   Transfused 1  Unit PRBC on 5/31   EGD showed no bleeding. Mild gastropathy  All the records are reviewed and case discussed with Care Management/Social Workerr. Management plans discussed with the patient, family and they are in agreement.  CODE STATUS: DNR.  TOTAL TIME TAKING CARE OF THIS PATIENT: 35 minutes.   POSSIBLE D/C IN 2-3 DAYS, DEPENDING ON CLINICAL CONDITION.  Hillary Bow R M.D on 01/28/2017   Between 7am to 6pm - Pager - [phone]  After 6pm go to [email or web address] - password [email or web address] [facility]  Office  [phone]  CC: Primary care physician; Tracie Harrier, MD  Note: This dictation was prepared with Dragon dictation along with smaller phrase technology. Any transcriptional errors that result from this process are unintentional.

## 2017-01-28 NOTE — Evaluation (Signed)
Physical Therapy Evaluation Patient Details Name: Regina Burke MRN: [id number] DOB: [birth year] Today's Date: 01/28/2017   History of Present Illness  Regina Burke  is a 83 y.o. female with a known history of chronic diastolic congestive heart failure, chronic COPD on 2 L nasal cannula oxygen, history of A. fib on Eliquis that was discontinued about 10 days ago by Dr. Candis Musa, chronic kidney disease stage IV. Comes to the emergency room with increasing shortness of breath, fatigability, orthopnea. Patient reports She gets tired very easily walking around the house getting more shortness of breath than her usual and has some nausea. Her by mouth intake has been poor. In the ER she was found to white count of 16,000. No fever. Creatinine up to 3.59 (baseline around 2). Chest x-ray shows increased left base pleural effusion. Patient is being admitted for acute on chronic hypoxic respiratory failure secondary to bilateral pleural effusion more on the left than right, acute on chronic diastolic congestive heart failure and acute on chronic CKD stage IV. She is currently receiving dialysis through a R temporary femoral catheter. OK to mobilize with PT per Dr. Candiss Norse.   Clinical Impression  Pt admitted with above diagnosis. Pt currently with functional limitations due to the deficits listed below (see PT Problem List).  Pt agrees to work with therapy after significant encouragement. She is modified independent with bed mobility and CGA for transfers and ambulation. Pt on bedrest due to R temporary femoral catheter however per Dr Candiss Norse OK to mobilize with physical therapy. Pt ambulates with 3 point gait pattern utilizing rolling walker to go from bed to door and back. She ambulates slowly but is steady with rolling walker. Denies DOE, Pt remains on 2L/min supplemental O2 throughout ambulation distance. Pt will be safe to return home at discharge. Recommend HH PT however pt declines. Agrees to work with physical  therapy during admission. Pt will benefit from skilled PT services to address deficits in strength, balance, and mobility in order to return to full function at home.     Follow Up Recommendations Home health PT;Other (comment) (Pt refuses)    Equipment Recommendations  None recommended by PT    Recommendations for Other Services       Precautions / Restrictions Precautions Precautions: Fall Precaution Comments: R temp fem cath. OK to mobilize with PT per Dr. Candiss Norse Restrictions Weight Bearing Restrictions: No      Mobility  Bed Mobility Overal bed mobility: Modified Independent             General bed mobility comments: Moves slowly and uses bed rail but no need for external assistance  Transfers Overall transfer level: Needs assistance Equipment used: Rolling walker (2 wheeled) Transfers: Sit to/from Stand Sit to Stand: Min guard         General transfer comment: Increased time to perform, safe hand placement demonstrated. Pt is steady when in standing. Toilet transfer also performed with patient and CNA assist. Pt demonstrates good turning without UE support and good safety awareness  Ambulation/Gait Ambulation/Gait assistance: Min guard Ambulation Distance (Feet): 20 Feet Assistive device: Rolling walker (2 wheeled) Gait Pattern/deviations: Decreased step length - right;Decreased step length - left Gait velocity: Decreased Gait velocity interpretation: <1.8 ft/sec, indicative of risk for recurrent falls General Gait Details: Pt ambulates with 3 point gait pattern utilizing rolling walker to go from bed to door and back. She ambulates slowly but is steady with rolling walker. Denies DOE, Pt remains on 2L/min supplemental O2 throughout ambulation  distance.   Stairs            Wheelchair Mobility    Modified Rankin (Stroke Patients Only)       Balance Overall balance assessment: Needs assistance Sitting-balance support: No upper extremity  supported Sitting balance-Leahy Scale: Good     Standing balance support: No upper extremity supported Standing balance-Leahy Scale: Fair Standing balance comment: Fair balanace with feet apart and together. Positive Rhomberg                             Pertinent Vitals/Pain Pain Assessment: No/denies pain    Home Living Family/patient expects to be discharged to:: Private residence Living Arrangements: Children Available Help at Discharge: Family;Available 24 hours/day Type of Home: House Home Access: Stairs to enter Entrance Stairs-Rails: None Entrance Stairs-Number of Steps: 1 step to get onto porch, one step to get into house, one step inside house between rooms. Can hold onto wall for support with each step. Home Layout: One level Home Equipment: Walker - 2 wheels;Cane - single point;Shower seat;Grab bars - tub/shower;Walker - 4 wheels;Other (comment) (Home O2 (2L/min))      Prior Function Level of Independence: Needs assistance   Gait / Transfers Assistance Needed: Uses spc when she goes out to the doctor. Mostly stays at home. 2 falls in the last 12 months  ADL's / Homemaking Assistance Needed: Independent for ADLs, assist for IADLs.         Hand Dominance   Dominant Hand: Right    Extremity/Trunk Assessment   Upper Extremity Assessment Upper Extremity Assessment: Overall WFL for tasks assessed    Lower Extremity Assessment Lower Extremity Assessment: Generalized weakness       Communication   Communication: No difficulties  Cognition Arousal/Alertness: Awake/alert Behavior During Therapy: WFL for tasks assessed/performed Overall Cognitive Status: Within Functional Limits for tasks assessed                                        General Comments      Exercises     Assessment/Plan    PT Assessment Patient needs continued PT services  PT Problem List Decreased strength;Decreased activity tolerance;Decreased balance        PT Treatment Interventions DME instruction;Gait training;Stair training;Functional mobility training;Balance training;Therapeutic exercise;Therapeutic activities;Neuromuscular re-education;Patient/family education    PT Goals (Current goals can be found in the Care Plan section)  Acute Rehab PT Goals Patient Stated Goal: Return home PT Goal Formulation: With patient Time For Goal Achievement: 02/11/17 Potential to Achieve Goals: Good    Frequency Min 2X/week   Barriers to discharge        Co-evaluation               AM-PAC PT "6 Clicks" Daily Activity  Outcome Measure Difficulty turning over in bed (including adjusting bedclothes, sheets and blankets)?: A Little Difficulty moving from lying on back to sitting on the side of the bed? : A Little Difficulty sitting down on and standing up from a chair with arms (e.g., wheelchair, bedside commode, etc,.)?: A Little Help needed moving to and from a bed to chair (including a wheelchair)?: A Little Help needed walking in hospital room?: A Little Help needed climbing 3-5 steps with a railing? : A Little 6 Click Score: 18    End of Session Equipment Utilized During Treatment: Gait belt Activity  Tolerance: Patient tolerated treatment well Patient left: in bed;with call bell/phone within reach;with bed alarm set;with nursing/sitter in room   PT Visit Diagnosis: Unsteadiness on feet (R26.81);Muscle weakness (generalized) (M62.81)    Time: 8329-1916 PT Time Calculation (min) (ACUTE ONLY): 27 min   Charges:   PT Evaluation $PT Eval Low Complexity: 1 Procedure PT Treatments $Therapeutic Activity: 8-22 mins   PT G Codes:   PT G-Codes **NOT FOR INPATIENT CLASS** Functional Assessment Tool Used: AM-PAC 6 Clicks Basic Mobility Functional Limitation: Mobility: Walking and moving around Mobility: Walking and Moving Around Current Status (O0600): At least 40 percent but less than 60 percent impaired, limited or  restricted Mobility: Walking and Moving Around Goal Status [phone]): At least 20 percent but less than 40 percent impaired, limited or restricted    Phillips Grout PT, DPT    Huprich,Jason 01/28/2017, 2:27 PM

## 2017-01-28 NOTE — Progress Notes (Signed)
[name], MD [facility]   [address] [place], [place] [address] Phone: [phone] Fax : [phone]   Subjective: The patient has had no further sign of GI bleeding. Her hemoglobin has dropped some since 2 days ago but is stable from yesterday but a drop from 2 days ago. The patient has been given a laxative because she states she has not had a bowel movement. She denies any abdominal pain nausea vomiting fevers or chills.   Objective: Vital signs in last 24 hours: Vitals:   01/27/17 1717 01/27/17 1938 01/28/17 0501 01/28/17 0800  BP: (!) 176/45 (!) 134/45 (!) 127/42 128/61  Pulse: 71 69 70 69  Resp: 18  16 16   Temp: 98.3 F (36.8 C) 98.6 F (37 C) 98.6 F (37 C) 97.7 F (36.5 C)  TempSrc: Oral Oral Oral Oral  SpO2: 96% 98% 98% 100%  Weight: 180 lb (81.6 kg)  179 lb 3.7 oz (81.3 kg)   Height:       Weight change: -14.9 oz (-0.423 kg)  Intake/Output Summary (Last 24 hours) at 01/28/17 1032 Last data filed at 01/28/17 2952  Gross per 24 hour  Intake              240 ml  Output             1383 ml  Net            -1143 ml     Exam: Heart:: Regular rate and rhythm Lungs: normal, clear to auscultation and clear to auscultation and percussion Abdomen: soft, nontender, normal bowel sounds   Lab Results: @LABTEST2 @ Micro Results: Recent Results (from the past 240 hour(s))  Culture, blood (Routine x 2)     Status: None   Collection Time: 01/23/17 11:21 AM  Result Value Ref Range Status   Specimen Description BLOOD RIGHT HAND  Final   Special Requests   Final    BOTTLES DRAWN AEROBIC ONLY BCHV A ANAEROBIC WAS SENT BUT WAS NOT LABEL   Culture NO GROWTH 5 DAYS  Final   Report Status 01/28/2017 FINAL  Final  Culture, blood (Routine x 2)     Status: None   Collection Time: 01/23/17 11:26 AM  Result Value Ref Range Status   Specimen Description BLOOD RIGHT WRIST  Final   Special Requests BOTTLES DRAWN AEROBIC AND ANAEROBIC BCLV  Final   Culture NO GROWTH 5  DAYS  Final   Report Status 01/28/2017 FINAL  Final  Urine culture     Status: None   Collection Time: 01/24/17  3:24 PM  Result Value Ref Range Status   Specimen Description URINE, RANDOM  Final   Special Requests NONE  Final   Culture   Final    NO GROWTH Performed at [facility], [address], [place], [place] [address]    Report Status 01/26/2017 FINAL  Final  MRSA PCR Screening     Status: None   Collection Time: 01/27/17  9:58 AM  Result Value Ref Range Status   MRSA by PCR NEGATIVE NEGATIVE Final    Comment:        The GeneXpert MRSA Assay (FDA approved for NASAL specimens only), is one component of a comprehensive MRSA colonization surveillance program. It is not intended to diagnose MRSA infection nor to guide or monitor treatment for MRSA infections.    Studies/Results: No results found. Medications: I have reviewed the patient's current medications. Scheduled Meds: . ALPRAZolam  0.5 mg Oral  QHS  . atorvastatin  20 mg Oral QPM  . bisacodyl  10 mg Oral Once  . cholecalciferol  400 Units Oral Daily  . docusate sodium  200 mg Oral BID  . epoetin (EPOGEN/PROCRIT) injection  10,000 Units Subcutaneous Once  . fenofibrate  54 mg Oral Daily  . ferrous sulfate  325 mg Oral Q breakfast  . insulin aspart  0-9 Units Subcutaneous TID WC  . multivitamin-lutein  1 capsule Oral Daily  . pantoprazole  40 mg Oral BID  . sertraline  50 mg Oral Daily  . sodium chloride flush  3 mL Intravenous Q12H  . tiotropium  18 mcg Inhalation Daily  . tuberculin  5 Units Intradermal Once  . vitamin B-12  500 mcg Oral Daily   Continuous Infusions: PRN Meds:.acetaminophen **OR** acetaminophen, fentaNYL (SUBLIMAZE) injection, fluticasone furoate-vilanterol, nitroGLYCERIN, ondansetron **OR** ondansetron (ZOFRAN) IV, ondansetron (ZOFRAN) IV, senna-docusate, sodium chloride flush, traMADol   Assessment: Active Problems:   CHF, acute on chronic (HCC)   Blood in stool   Other  diseases of stomach and duodenum    Plan: This patient had melena and had an EGD with some erosions. The patient has had no further sign of any GI bleeding. The patient has no abdominal pain. No further workup is needed at this time from a GI point of view. I will sign off.  Please call if any further GI concerns or questions.  We would like to thank you for the opportunity to participate in the care of Regina Burke.    LOS: 5 days   [name] 01/28/2017, 10:32 AM

## 2017-01-29 ENCOUNTER — Inpatient Hospital Stay: Payer: Medicare Other

## 2017-01-29 ENCOUNTER — Encounter: Payer: Self-pay | Admitting: Gastroenterology

## 2017-01-29 ENCOUNTER — Encounter: Payer: Self-pay | Admitting: Physician Assistant

## 2017-01-29 LAB — BASIC METABOLIC PANEL
ANION GAP: 10 (ref 5–15)
BUN: 52 mg/dL — AB (ref 6–20)
CHLORIDE: 101 mmol/L (ref 101–111)
CO2: 26 mmol/L (ref 22–32)
Calcium: 8.8 mg/dL — ABNORMAL LOW (ref 8.9–10.3)
Creatinine, Ser: 1.88 mg/dL — ABNORMAL HIGH (ref 0.44–1.00)
GFR, EST AFRICAN AMERICAN: 28 mL/min — AB (ref >60)
GFR, EST NON AFRICAN AMERICAN: 24 mL/min — AB (ref >60)
Glucose, Bld: 98 mg/dL (ref 65–99)
Potassium: 4.2 mmol/L (ref 3.5–5.1)
SODIUM: 137 mmol/L (ref 135–145)

## 2017-01-29 LAB — GLUCOSE, CAPILLARY
GLUCOSE-CAPILLARY: 107 mg/dL — AB (ref 65–99)
GLUCOSE-CAPILLARY: 150 mg/dL — AB (ref 65–99)
GLUCOSE-CAPILLARY: 131 mg/dL — AB (ref 65–99)
Glucose-Capillary: 133 mg/dL — ABNORMAL HIGH (ref 65–99)

## 2017-01-29 LAB — SEDIMENTATION RATE: Sed Rate: 52 mm/hr — ABNORMAL HIGH (ref 0–30)

## 2017-01-29 MED ORDER — IOPAMIDOL (ISOVUE-300) INJECTION 61%
15.0000 mL | INTRAVENOUS | Status: AC
  Administered 2017-01-29: 15 mL via ORAL

## 2017-01-29 MED ORDER — ALTEPLASE 2 MG IJ SOLR
2.0000 mg | Freq: Once | INTRAMUSCULAR | Status: AC
  Administered 2017-01-29: 2 mg
  Filled 2017-01-29: qty 2

## 2017-01-29 NOTE — Care Management (Signed)
Results for Regina Burke, Regina Burke (MRN [id number]) as of 01/29/2017 08:25  Ref. Range 01/26/2017 21:27  Hepatitis B Surface Ag Latest Ref Range: Negative  Negative  Hep B S Ab Unknown Non Reactive  Hep B Core Ab, Tot Latest Ref Range: Negative  Negative  HCV Ab Latest Ref Range: 0.0 - 0.9 s/co ratio <0.1

## 2017-01-29 NOTE — Progress Notes (Signed)
   01/28/17 1225 01/28/17 1226  PPD Results  Does patient have an induration at the injection site? No No

## 2017-01-29 NOTE — Progress Notes (Signed)
[facility] NAME: Regina Burke    MR#:  [id number]  DATE OF BIRTH:  05-24-1933  SUBJECTIVE:  CHIEF COMPLAINT:   Chief Complaint  Patient presents with  . Shortness of Breath    Admitted for acute on chronic hypoxic respiratory failure with CHF and COPD. Also acute kidney injury over CKD. Anemia. EGD showed no bleeding. Mild gastropathy.  Improving with HD. UOP only 275 ml last 24 hrs Constipated.    REVIEW OF SYSTEMS:  CONSTITUTIONAL: No fever, Positive fatigue or weakness.  EYES: No blurred or double vPositive for vision.  EARS, NOSE, AND THROAT: No tinnitus or ear pain.  RESPIRATORY: No cough, shortness of breath, wheezing or hemoptysis.  CARDIOVASCULAR: No chest pain, orthopnea, edema.  GASTROINTESTINAL: No nausea, vomiting, diarrhea or abdominal pain.  GENITOURINARY: No dysuria, hematuria.  ENDOCRINE: No polyuria, nocturia,  HEMATOLOGY: No anemia, easy bruising or bleeding SKIN: No rash or lesion. MUSCULOSKELETAL: No joint pain or arthritis.   NEUROLOGIC: No tingling, numbness, weakness.  PSYCHIATRY: No anxiety or depression.   ROS  DRUG ALLERGIES:   Allergies  Allergen Reactions  . Ciprofloxacin Shortness Of Breath, Itching and Rash  . Doxycycline Shortness Of Breath, Itching and Rash  . Penicillins Shortness Of Breath, Itching, Rash and Other (See Comments)    Has patient had a PCN reaction causing immediate rash, facial/tongue/throat swelling, SOB or lightheadedness with hypotension: Yes Has patient had a PCN reaction causing severe rash involving mucus membranes or skin necrosis: No Has patient had a PCN reaction that required hospitalization No Has patient had a PCN reaction occurring within the last 10 years: No If all of the above answers are "NO", then may proceed with Cephalosporin use.  . Sulfa Antibiotics Shortness Of Breath, Itching and Rash  . Latex Itching  . Morphine And Related Itching  .  Albuterol Sulfate   . Cefuroxime Rash    Blisters in mouth    VITALS:  Blood pressure (!) 155/53, pulse 70, temperature 98.4 F (36.9 C), temperature source Oral, resp. rate 18, height 5\' 4"  (1.626 m), weight 81.5 kg (179 lb 11.2 oz), SpO2 100 %.  PHYSICAL EXAMINATION:  GENERAL:  83 y.o.-year-old patient lying in the bed with no acute distress.  EYES: Pupils equal, round, reactive to light and accommodation. No scleral icterus. Extraocular muscles intact.  HEENT: Head atraumatic, normocephalic. Oropharynx and nasopharynx clear.  NECK:  Supple, no jugular venous distention. No thyroid enlargement, no tenderness.  LUNGS: Bibasilar crackles CARDIOVASCULAR: S1, S2 normal. No murmurs, rubs, or gallops.  ABDOMEN: Soft, nontender, nondistended. Bowel sounds present. No organomegaly or mass.  EXTREMITIES: No pedal edema, cyanosis, or clubbing.  NEUROLOGIC: Cranial nerves II through XII are intact. Muscle strength 5/5 in all extremities. Sensation intact. Gait not checked.  PSYCHIATRIC: The patient is alert and oriented x 3.  SKIN: No obvious rash, lesion, or ulcer.  Right femoral temporary dialysis catheter  Physical Exam LABORATORY PANEL:   CBC  Recent Labs Lab 01/27/17 0526 01/28/17 0518  WBC 8.2  --   HGB 8.3* 8.7*  HCT 25.0*  --   PLT 258  --    ------------------------------------------------------------------------------------------------------------------  Chemistries   Recent Labs Lab 01/23/17 1121  01/28/17 0518  NA 133*  < > 137  K 5.3*  < > 4.2  CL 99*  < > 102  CO2 20*  < > 28  GLUCOSE 162*  < > 105*  BUN 85*  < > 50*  CREATININE 3.39*  < > 2.18*  CALCIUM 9.1  < > 8.4*  AST 26  --   --   ALT 14  --   --   ALKPHOS 53  --   --   BILITOT 1.8*  --   --   < > = values in this interval not displayed. ------------------------------------------------------------------------------------------------------------------  Cardiac Enzymes  Recent Labs Lab  01/23/17 1121  TROPONINI 0.04*   ------------------------------------------------------------------------------------------------------------------  RADIOLOGY:  No results found.  ASSESSMENT AND PLAN:   Active Problems:   CHF, acute on chronic (HCC)   Blood in stool   Other diseases of stomach and duodenum  * Acute respiratory failure with hypoxia - Improving   Multifactorial,  CHF and COPD.   The white count and pro-calcitonin were high. But afebrile   UA is negative, CT chest is not showing pneumonia, stopped ABx.  * Acute on Chronic diastolic CHF.   Improving with HD.  * AKI, CAD stage III. Worsening and with uremic symptoms and CHF patient started on hemodialysis on 01/26/2017.   No HD yesterday. We will wait for nephrology input regarding further dialysis needs. Patient interested in peritoneal dialysis as long term plan.  * Hyperkalemia   Resolved  * Melena   Hemoglobin appears stable  Anti-coagulation on hold, continue monitoring.   Appreciated GI consult Endoscopy - No bleeding  * Elevated troponin due to demand ischemia. Not MI   Seen by Dr. Fletcher Anon, continue beta blocker and statins.  * COPD   No acute wheezing.   Continue home inhalers.   * Anemia of chronic disease   Transfused 1  Unit PRBC on 5/31   EGD showed no bleeding. Mild gastropathy  All the records are reviewed and case discussed with Care Management/Social Workerr. Management plans discussed with the patient, family and they are in agreement.  CODE STATUS: DNR.  TOTAL TIME TAKING CARE OF THIS PATIENT: 35 minutes.   POSSIBLE D/C IN 2-3 DAYS, DEPENDING ON CLINICAL CONDITION.  Hillary Bow R M.D on 01/29/2017   Between 7am to 6pm - Pager - [phone]  After 6pm go to [email or web address] - password [email or web address] [facility]  Office  [phone]  CC: Primary care physician; Tracie Harrier, MD  Note: This dictation was prepared with Dragon dictation along with smaller  phrase technology. Any transcriptional errors that result from this process are unintentional.

## 2017-01-29 NOTE — Progress Notes (Signed)
[facility], [place] 01/29/17  Subjective:  Blood was unable to be drawn from the patient's middle port. She states that she made very little urine.  in addition she had bloody urine compatible with hematuria.  Objective:  Vital signs in last 24 hours:  Temp:  [97.9 F (36.6 C)-98.4 F (36.9 C)] 98.4 F (36.9 C) (06/04 1138) Pulse Rate:  [69-70] 70 (06/04 1138) Resp:  [18-19] 18 (06/04 1138) BP: (132-155)/(51-59) 155/53 (06/04 1138) SpO2:  [100 %] 100 % (06/04 1138) Weight:  [81.5 kg (179 lb 11.2 oz)] 81.5 kg (179 lb 11.2 oz) (06/04 0535)  Weight change: -2.889 kg (-6 lb 5.9 oz) Filed Weights   01/27/17 1717 01/28/17 0501 01/29/17 0535  Weight: 81.6 kg (180 lb) 81.3 kg (179 lb 3.7 oz) 81.5 kg (179 lb 11.2 oz)    Intake/Output:    Intake/Output Summary (Last 24 hours) at 01/29/17 1224 Last data filed at 01/29/17 0931  Gross per 24 hour  Intake              240 ml  Output              275 ml  Net              -35 ml     Physical Exam: General: Laying in bed, no acute distress  HEENT Moist oral mucous membranes   Neck Supple   Pulm/lungs Clear bilaterally, normal effort  CVS/Heart irregular, no rub    Abdomen:  Soft,  Nondistended, BS present  Extremities: + peripheral edema   Neurologic: Alert, oriented, follows commands  Skin: No acute rashes   Access: Rt fem trialysis catheter (dr Dew/ May 31)       Basic Metabolic Panel:   Recent Labs Lab 01/24/17 1120 01/25/17 0548 01/26/17 0418 01/27/17 0526 01/28/17 0518  NA 132* 135 136 139 137  K 5.9* 4.9 4.7 4.3 4.2  CL 98* 100* 100* 102 102  CO2 23 24 25 27 28   GLUCOSE 161* 112* 126* 111* 105*  BUN 93* 99* 99* 66* 50*  CREATININE 3.87* 3.81* 3.49* 2.29* 2.18*  CALCIUM 8.8* 8.3* 8.5* 8.3* 8.4*  PHOS  --  6.5*  --   --   --      CBC:  Recent Labs Lab 01/23/17 1121 01/24/17 1120 01/25/17 0548 01/26/17 0418 01/27/17 0526 01/28/17 0518  WBC 18.9* 19.4* 10.7 11.1* 8.2   --   NEUTROABS 17.7*  --   --   --   --   --   HGB 8.5* 7.7* 7.4* 8.5* 8.3* 8.7*  HCT 26.8* 23.6* 22.5* 26.4* 25.0*  --   MCV 82.5 80.0 80.5 82.2 81.9  --   PLT 309 277 242 281 258  --       Lab Results  Component Value Date   HEPBSAG Negative 01/26/2017   HEPBSAB Non Reactive 01/26/2017      Microbiology:  Recent Results (from the past 240 hour(s))  Culture, blood (Routine x 2)     Status: None   Collection Time: 01/23/17 11:21 AM  Result Value Ref Range Status   Specimen Description BLOOD RIGHT HAND  Final   Special Requests   Final    BOTTLES DRAWN AEROBIC ONLY BCHV A ANAEROBIC WAS SENT BUT WAS NOT LABEL   Culture NO GROWTH 5 DAYS  Final   Report Status 01/28/2017 FINAL  Final  Culture, blood (Routine x 2)     Status: None  Collection Time: 01/23/17 11:26 AM  Result Value Ref Range Status   Specimen Description BLOOD RIGHT WRIST  Final   Special Requests BOTTLES DRAWN AEROBIC AND ANAEROBIC BCLV  Final   Culture NO GROWTH 5 DAYS  Final   Report Status 01/28/2017 FINAL  Final  Urine culture     Status: None   Collection Time: 01/24/17  3:24 PM  Result Value Ref Range Status   Specimen Description URINE, RANDOM  Final   Special Requests NONE  Final   Culture   Final    NO GROWTH Performed at [facility], [place] [address], [place], [place] [address]    Report Status 01/26/2017 FINAL  Final  MRSA PCR Screening     Status: None   Collection Time: 01/27/17  9:58 AM  Result Value Ref Range Status   MRSA by PCR NEGATIVE NEGATIVE Final    Comment:        The GeneXpert MRSA Assay (FDA approved for NASAL specimens only), is one component of a comprehensive MRSA colonization surveillance program. It is not intended to diagnose MRSA infection nor to guide or monitor treatment for MRSA infections.     Coagulation Studies: No results for input(s): LABPROT, INR in the last 72 hours.  Urinalysis: No results for input(s): COLORURINE, LABSPEC, PHURINE, GLUCOSEU,  HGBUR, BILIRUBINUR, KETONESUR, PROTEINUR, UROBILINOGEN, NITRITE, LEUKOCYTESUR in the last 72 hours.  Invalid input(s): APPERANCEUR    Imaging: No results found.   Medications:    . ALPRAZolam  0.5 mg Oral QHS  . alteplase  2 mg Intracatheter Once  . atorvastatin  20 mg Oral QPM  . cholecalciferol  400 Units Oral Daily  . docusate sodium  200 mg Oral BID  . fenofibrate  54 mg Oral Daily  . ferrous sulfate  325 mg Oral Q breakfast  . insulin aspart  0-9 Units Subcutaneous TID WC  . iopamidol  15 mL Oral Q1 Hr x 2  . multivitamin-lutein  1 capsule Oral Daily  . pantoprazole  40 mg Oral BID  . sertraline  50 mg Oral Daily  . sodium chloride flush  3 mL Intravenous Q12H  . tiotropium  18 mcg Inhalation Daily  . vitamin B-12  500 mcg Oral Daily   acetaminophen **OR** acetaminophen, fentaNYL (SUBLIMAZE) injection, fluticasone furoate-vilanterol, nitroGLYCERIN, ondansetron **OR** ondansetron (ZOFRAN) IV, ondansetron (ZOFRAN) IV, senna-docusate, sodium chloride flush, traMADol  Assessment/ Plan:  83 y.o.caucasian female with diabetes mellitus type II, coronary artery disease, hypertension, hyperlipidemia, generalized anxiety disorder, gout, anemia, history of lung cancer, COPD, atrial fibrillation, biventricular ICD, fatty liver disease, GERD, osteoporosis   1. ARF on CKD st 4 Baseline creatinine of 2 /GFR 22 noted on 11/14/2006 Acute renal failure is likely multifactorial related to low normal blood pressure, concurrent illness leading to ATN  Plan -  ARF dialysis 6/1- 1000 cc removed, 6/2 1300 cc removed - No urgent indication for dialysis today. We will place catheter flow within the middle port of the dialysis catheter as blood could not be returned. We will reassess the patient for dialysis tomorrow.   2. Anemia of chronic kidney disease and possibly blood loss - was on Eliquis which was stopped as outpatient recently - GI eval ongoing. Underwent upper endoscopy 6/1 -   Hemoglobin currently 8.7 and slightly improved.  3. SOB with left pleural effusion, Mild Pulmonary edema - likely multifactorial with pleural effusions, pericardial effusion and renal failure with volume overload- underlying etiology serositis from Lupus vs Uremia -  avoid maintainence IV fluids + dsDNA - Rheumatology consult pending.  4. Mild Hyperkalemia - Appears to have resolved at the moment. Serum potassium currently 4.2. Continue to monitor.      LOS: 6 Suellyn Meenan 6/4/201812:24 PM  [facility], [place]

## 2017-01-29 NOTE — Care Management (Signed)
Reviewed case with Elvera Bicker with [employer]

## 2017-01-29 NOTE — Consult Note (Signed)
Reason for Consult: Positive anti-DNA  Referring Physician: Hospitalist  Regina Burke   HPI: 83 year old white female. Numerous prior medical problems. Prior Cushing's syndrome. Treated with left adrenal gland removal in 1980s. Since then she's developed COPD. Lung cancer. Chronic kidney disease with creatinine usually at 2.5. Stage IV. Diabetes. Cardiomyopathy. Had atrial fibrillation then required stent and icemaker of. Since then kidney functions been a little worse. Had recent anemia. Thought to have melena. Treatment for H. pylori. Was anemic down to 8. Was admitted. Endoscopy is been unremarkable. However creatinine was elevated. She had some evidence of fluid overload with bilateral pleural effusions and pericardial effusion. She has been dialyzed now is down 10 pounds of weight. During evaluation of her renal insufficiency she had labs drawn that showed anti-DNA of 36 Daughter has positive ANA but not a diagnosis of lupus The patient is never had Raynaud's, pleurisy, proteinuria, photosensitive skin rash. Recent echo of the abdomen showed medical renal disease. CT of the chest showed splenomegaly. Echocardiogram showed small pericardial effusion but good ejection fraction.  PMH: COPD. Coronary disease. Atrial fibrillation. Lung cancer status post radiation. Chronic kidney disease. Diabetes. Gout. Osteoarthritis. Anemia. Splenomegaly.  SURGICAL HISTORY: Pacemaker.  Family History: Daughter has positive ANA. But noted clinical diagnosis of lupus  Social History: Prior cigarettes. Stop. No significant alcohol  Allergies:  Allergies  Allergen Reactions  . Ciprofloxacin Shortness Of Breath, Itching and Rash  . Doxycycline Shortness Of Breath, Itching and Rash  . Penicillins Shortness Of Breath, Itching, Rash and Other (See Comments)    Has patient had a PCN reaction causing immediate rash, facial/tongue/throat swelling, SOB or lightheadedness with hypotension: Yes Has patient had a  PCN reaction causing severe rash involving mucus membranes or skin necrosis: No Has patient had a PCN reaction that required hospitalization No Has patient had a PCN reaction occurring within the last 10 years: No If all of the above answers are "NO", then may proceed with Cephalosporin use.  . Sulfa Antibiotics Shortness Of Breath, Itching and Rash  . Latex Itching  . Morphine And Related Itching  . Albuterol Sulfate   . Cefuroxime Rash    Blisters in mouth    Medications: Continuous:       ROS: No fever. No significant cough. Long-standing alopecia. No significant joint swelling or pain. Positive shortness of breath but improved. No chest pain.   PHYSICAL EXAM: Blood pressure (!) 155/53, pulse 70, temperature 98.4 F (36.9 C), temperature source Oral, resp. rate 18, height 5\' 4"  (1.626 m), weight 81.5 kg (179 lb 11.2 oz), SpO2 100 %. Pleasant female. O2 in place. No acute distress. Sclera clear. Alopecia. No discoid lesions. No photosensitive rash. No oral ulcers Good carotid upstroke. Regular rhythm. Clear chest. No significant edema. No palpable visceromegaly. Muscle skeletal: Good range of motion neck and shoulders. Hypertrophic changes right second DIP. No significant elbow nodules. No knee effusions.  Assessment: Low positive anti-DNA without obvious connective tissue disease. Negative anti-Smith antigen. Urine has inactive sediment. Pleural effusions responding to dialysis Acute on chronic renal insufficiency with echo evidence of chronic kidney disease Status post lung cancer with status post radiation Anemia Splenomegaly Fatty liver Cardiomyopathy. Status post pacemaker now with good ejection fraction  Recommendations: Not of evidence to recommend renal biopsy or to empirically treat with rheumatic regimen. Will get complement levels to complete database. Agree with current management  Emmaline Kluver 01/29/2017, 4:12 PM

## 2017-01-30 LAB — GLUCOSE, CAPILLARY
Glucose-Capillary: 106 mg/dL — ABNORMAL HIGH (ref 65–99)
Glucose-Capillary: 128 mg/dL — ABNORMAL HIGH (ref 65–99)

## 2017-01-30 NOTE — Progress Notes (Signed)
[facility], [place] 01/30/17  Subjective:  Renal function testing pending this a.m. However yesterday creatinine was down to 1.8. In addition urine output was found to be 1 L. Therefore we will hold further dialysis treatments.   Objective:  Vital signs in last 24 hours:  Temp:  [97.8 F (36.6 C)-98.7 F (37.1 C)] 97.8 F (36.6 C) (06/05 1119) Pulse Rate:  [70-72] 70 (06/05 1119) Resp:  [18-20] 18 (06/05 1119) BP: (153-178)/(57-65) 166/59 (06/05 1119) SpO2:  [97 %-100 %] 98 % (06/05 1119) Weight:  [82 kg (180 lb 12.8 oz)] 82 kg (180 lb 12.8 oz) (06/05 0321)  Weight change: 0.499 kg (1 lb 1.6 oz) Filed Weights   01/28/17 0501 01/29/17 0535 01/30/17 0321  Weight: 81.3 kg (179 lb 3.7 oz) 81.5 kg (179 lb 11.2 oz) 82 kg (180 lb 12.8 oz)    Intake/Output:    Intake/Output Summary (Last 24 hours) at 01/30/17 1229 Last data filed at 01/30/17 1200  Gross per 24 hour  Intake                0 ml  Output             1000 ml  Net            -1000 ml     Physical Exam: General: Laying in bed, no acute distress  HEENT Moist oral mucous membranes   Neck Supple   Pulm/lungs Clear bilaterally, normal effort  CVS/Heart irregular, no rub    Abdomen:  Soft,  Nondistended, BS present  Extremities: + peripheral edema   Neurologic: Alert, oriented, follows commands  Skin: No acute rashes   Access: Rt fem trialysis catheter (dr Dew/ May 31)       Basic Metabolic Panel:   Recent Labs Lab 01/25/17 0548 01/26/17 0418 01/27/17 0526 01/28/17 0518 01/29/17 1415  NA 135 136 139 137 137  K 4.9 4.7 4.3 4.2 4.2  CL 100* 100* 102 102 101  CO2 24 25 27 28 26   GLUCOSE 112* 126* 111* 105* 98  BUN 99* 99* 66* 50* 52*  CREATININE 3.81* 3.49* 2.29* 2.18* 1.88*  CALCIUM 8.3* 8.5* 8.3* 8.4* 8.8*  PHOS 6.5*  --   --   --   --      CBC:  Recent Labs Lab 01/24/17 1120 01/25/17 0548 01/26/17 0418 01/27/17 0526 01/28/17 0518  WBC 19.4* 10.7 11.1* 8.2   --   HGB 7.7* 7.4* 8.5* 8.3* 8.7*  HCT 23.6* 22.5* 26.4* 25.0*  --   MCV 80.0 80.5 82.2 81.9  --   PLT 277 242 281 258  --       Lab Results  Component Value Date   HEPBSAG Negative 01/26/2017   HEPBSAB Non Reactive 01/26/2017      Microbiology:  Recent Results (from the past 240 hour(s))  Culture, blood (Routine x 2)     Status: None   Collection Time: 01/23/17 11:21 AM  Result Value Ref Range Status   Specimen Description BLOOD RIGHT HAND  Final   Special Requests   Final    BOTTLES DRAWN AEROBIC ONLY BCHV A ANAEROBIC WAS SENT BUT WAS NOT LABEL   Culture NO GROWTH 5 DAYS  Final   Report Status 01/28/2017 FINAL  Final  Culture, blood (Routine x 2)     Status: None   Collection Time: 01/23/17 11:26 AM  Result Value Ref Range Status   Specimen Description BLOOD RIGHT WRIST  Final  Special Requests BOTTLES DRAWN AEROBIC AND ANAEROBIC BCLV  Final   Culture NO GROWTH 5 DAYS  Final   Report Status 01/28/2017 FINAL  Final  Urine culture     Status: None   Collection Time: 01/24/17  3:24 PM  Result Value Ref Range Status   Specimen Description URINE, RANDOM  Final   Special Requests NONE  Final   Culture   Final    NO GROWTH Performed at [facility], [place] [address] [place], [place] [address]    Report Status 01/26/2017 FINAL  Final  MRSA PCR Screening     Status: None   Collection Time: 01/27/17  9:58 AM  Result Value Ref Range Status   MRSA by PCR NEGATIVE NEGATIVE Final    Comment:        The GeneXpert MRSA Assay (FDA approved for NASAL specimens only), is one component of a comprehensive MRSA colonization surveillance program. It is not intended to diagnose MRSA infection nor to guide or monitor treatment for MRSA infections.     Coagulation Studies: No results for input(s): LABPROT, INR in the last 72 hours.  Urinalysis: No results for input(s): COLORURINE, LABSPEC, PHURINE, GLUCOSEU, HGBUR, BILIRUBINUR, KETONESUR, PROTEINUR, UROBILINOGEN,  NITRITE, LEUKOCYTESUR in the last 72 hours.  Invalid input(s): APPERANCEUR    Imaging: Ct Abdomen Pelvis Wo Contrast  Result Date: 01/29/2017 CLINICAL DATA:  83 year old female with history of acute renal failure. Hematuria. Anemia. EXAM: CT ABDOMEN AND PELVIS WITHOUT CONTRAST TECHNIQUE: Multidetector CT imaging of the abdomen and pelvis was performed following the standard protocol without IV contrast. COMPARISON:  CT the abdomen and pelvis 06/27/2013. FINDINGS: Lower chest: Mild cardiomegaly. Small amount of pericardial fluid and/or thickening. No pericardial calcification. Biventricular pacemaker leads. Aortic atherosclerosis. Small left and trace right pleural effusions. Passive subsegmental atelectasis in left lower lobe. Hepatobiliary: Tiny calcified granuloma in the left lobe of the liver. No definite cystic or solid hepatic lesions are identified on today's noncontrast CT examination. Status post cholecystectomy. Pancreas: No pancreatic mass or peripancreatic inflammatory changes are noted on today's noncontrast CT examination. Spleen: Multiple well-defined low-attenuation splenic lesions are again noted. These are very similar to numerous prior examinations dating back to 2011, with the largest of these lesions measuring up to 5.8 x 5.3 cm. These are incompletely characterized on today's noncontrast CT examination, but are presumably benign. Spleen remains enlarged measuring up to 10.6 x 9.0 x 15.8 cm (estimated splenic volume of 754 mL). Adrenals/Urinary Tract: No calculi are identified within the collecting system of either kidney, along the course of either ureter, or within the lumen of the urinary bladder. Atrophy of the kidneys bilaterally. Multiple lesions in both kidneys of varying degrees of complexity ranging from low to high attenuation, presumably a combination of small cysts and proteinaceous/hemorrhagic cysts, but incompletely characterized on today's noncontrast CT examination. No  hydroureteronephrosis. Urinary bladder is normal in appearance. Left adrenal gland is normal in appearance. 1.9 cm low-attenuation (9 Hounsfield unit) right adrenal nodule is similar to prior examinations, compatible with an adenoma. Stomach/Bowel: Unenhanced appearance of the stomach is normal. There is no pathologic dilatation of small bowel or colon. A few scattered colonic diverticulae are noted, without surrounding inflammatory changes to suggest an acute diverticulitis at this time. The appendix is not confidently identified and may be surgically absent. Regardless, there are no inflammatory changes noted adjacent to the cecum to suggest the presence of an acute appendicitis at this time. Vascular/Lymphatic: Aortic atherosclerosis, without definite aneurysm in the  abdominal or pelvic vasculature. Right femoral all PermCath with tip terminating in the inferior vena cava shortly beneath the level of the renal veins. No lymphadenopathy noted in the abdomen or pelvis. Reproductive: Status posthysterectomy. Ovaries are not confidently identified may be surgically absent or atrophic. Other: No significant volume of ascites.  No pneumoperitoneum. Musculoskeletal: There are no aggressive appearing lytic or blastic lesions noted in the visualized portions of the skeleton. IMPRESSION: 1. No definite source for hematuria identified on today's study. Specifically, no urinary tract calculi no findings of urinary tract obstruction are noted at this time. 2. However, there are multiple renal lesions bilaterally of varying degrees of complexity. These are favored to represent multiple cysts and proteinaceous/hemorrhagic cysts, and generally appear similar to prior studies. These could be definitively characterized with MRI of the abdomen with and without IV gadolinium if hematuria persists or worsens. 3. Small left and trace right pleural effusions. 4. Small amount of pericardial fluid and/or thickening. 5. Splenomegaly. 6.  Atherosclerosis. 7. Colonic diverticulosis without evidence of acute diverticulitis at this time. 8. Additional incidental findings, as above, similar prior studies. Electronically Signed   By: Vinnie Langton M.D.   On: 01/29/2017 14:44     Medications:    . ALPRAZolam  0.5 mg Oral QHS  . atorvastatin  20 mg Oral QPM  . cholecalciferol  400 Units Oral Daily  . docusate sodium  200 mg Oral BID  . fenofibrate  54 mg Oral Daily  . ferrous sulfate  325 mg Oral Q breakfast  . insulin aspart  0-9 Units Subcutaneous TID WC  . multivitamin-lutein  1 capsule Oral Daily  . pantoprazole  40 mg Oral BID  . sertraline  50 mg Oral Daily  . sodium chloride flush  3 mL Intravenous Q12H  . tiotropium  18 mcg Inhalation Daily  . vitamin B-12  500 mcg Oral Daily   acetaminophen **OR** acetaminophen, fentaNYL (SUBLIMAZE) injection, fluticasone furoate-vilanterol, nitroGLYCERIN, ondansetron **OR** ondansetron (ZOFRAN) IV, ondansetron (ZOFRAN) IV, senna-docusate, sodium chloride flush, traMADol  Assessment/ Plan:  83 y.o.caucasian female with diabetes mellitus type II, coronary artery disease, hypertension, hyperlipidemia, generalized anxiety disorder, gout, anemia, history of lung cancer, COPD, atrial fibrillation, biventricular ICD, fatty liver disease, GERD, osteoporosis   1. ARF on CKD st 4 Baseline creatinine of 2/GFR 22 noted on 11/13/2016 Acute renal failure is likely multifactorial related to low normal blood pressure, concurrent illness leading to ATN/ -  ARF dialysis 6/1- 1000 cc removed, 6/2 1300 cc removed  Plan - Creatinine currently down to 1.8. Urine output was 1 L over the preceding 24 hours. Therefore we will discontinue temporary dialysis catheter at this time.   2. Anemia of chronic kidney disease and possibly blood loss - was on Eliquis which was stopped as outpatient recently -  Underwent upper endoscopy 6/1 -  Hemoglobin currently 8.7 and slightly improved, may need to consider  Epogen as an outpatient.  3. SOB with left pleural effusion, Mild Pulmonary edema - likely multifactorial with pleural effusions, pericardial effusion and renal failure with volume overload- underlying etiology serositis from Lupus vs Uremia - Appreciate rheumatology evaluation. They do not recommend renal biopsy at this point in time.  4. Mild Hyperkalemia - This appears to have resolved. Most recent serum potassium was 4.2. Continue to monitor.      LOS: 7 Regina Burke 6/5/201812:29 PM  [facility], [place]

## 2017-01-30 NOTE — Plan of Care (Signed)
Problem: Health Behavior/Discharge Planning: Goal: Ability to manage health-related needs will improve Outcome: Completed/Met Date Met: 01/30/17 Patient's status is much improved - no reasons that she cannot care for herself at home.  Home RN will follow and assess for changes.

## 2017-01-30 NOTE — Care Management Important Message (Signed)
Important Message  Patient Details  Name: Regina Burke MRN: [id number] Date of Birth: 05-17-1933   Medicare Important Message Given:  Yes Signed IM notice given   Katrina Stack, RN 01/30/2017, 11:39 AM

## 2017-01-30 NOTE — Progress Notes (Signed)
PT Cancellation Note  Patient Details Name: Regina Burke MRN: [id number] DOB: [birth year]   Cancelled Treatment:    Reason Eval/Treat Not Completed: Fatigue/lethargy limiting ability to participate   Offered and encouraged session this am.  She declined stating she is not feeling well today and had poor sleep.  Will continue as appropriate.   Chesley Noon 01/30/2017, 10:13 AM

## 2017-01-31 ENCOUNTER — Other Ambulatory Visit: Payer: Self-pay | Admitting: Orthopedic Surgery

## 2017-01-31 ENCOUNTER — Ambulatory Visit: Payer: Self-pay | Admitting: Orthopedic Surgery

## 2017-01-31 ENCOUNTER — Inpatient Hospital Stay
Admission: EM | Admit: 2017-01-31 | Discharge: 2017-02-05 | DRG: 481 | Disposition: A | Discharge status: Skilled Nursing Facility | Payer: Medicare Other | Attending: Internal Medicine | Admitting: Internal Medicine

## 2017-01-31 ENCOUNTER — Emergency Department: Payer: Medicare Other

## 2017-01-31 DIAGNOSIS — E249 Cushing's syndrome, unspecified: Secondary | ICD-10-CM | POA: Diagnosis present

## 2017-01-31 DIAGNOSIS — Z955 Presence of coronary angioplasty implant and graft: Secondary | ICD-10-CM

## 2017-01-31 DIAGNOSIS — Z9049 Acquired absence of other specified parts of digestive tract: Secondary | ICD-10-CM

## 2017-01-31 DIAGNOSIS — I5042 Chronic combined systolic (congestive) and diastolic (congestive) heart failure: Secondary | ICD-10-CM | POA: Diagnosis present

## 2017-01-31 DIAGNOSIS — M1990 Unspecified osteoarthritis, unspecified site: Secondary | ICD-10-CM | POA: Diagnosis present

## 2017-01-31 DIAGNOSIS — E875 Hyperkalemia: Secondary | ICD-10-CM | POA: Diagnosis not present

## 2017-01-31 DIAGNOSIS — E785 Hyperlipidemia, unspecified: Secondary | ICD-10-CM | POA: Diagnosis present

## 2017-01-31 DIAGNOSIS — H9190 Unspecified hearing loss, unspecified ear: Secondary | ICD-10-CM | POA: Diagnosis present

## 2017-01-31 DIAGNOSIS — C3410 Malignant neoplasm of upper lobe, unspecified bronchus or lung: Secondary | ICD-10-CM | POA: Diagnosis present

## 2017-01-31 DIAGNOSIS — I340 Nonrheumatic mitral (valve) insufficiency: Secondary | ICD-10-CM | POA: Diagnosis present

## 2017-01-31 DIAGNOSIS — N179 Acute kidney failure, unspecified: Secondary | ICD-10-CM | POA: Diagnosis not present

## 2017-01-31 DIAGNOSIS — S72009A Fracture of unspecified part of neck of unspecified femur, initial encounter for closed fracture: Secondary | ICD-10-CM

## 2017-01-31 DIAGNOSIS — Y92018 Other place in single-family (private) house as the place of occurrence of the external cause: Secondary | ICD-10-CM | POA: Diagnosis not present

## 2017-01-31 DIAGNOSIS — N189 Chronic kidney disease, unspecified: Secondary | ICD-10-CM | POA: Diagnosis present

## 2017-01-31 DIAGNOSIS — I482 Chronic atrial fibrillation: Secondary | ICD-10-CM | POA: Diagnosis not present

## 2017-01-31 DIAGNOSIS — I2510 Atherosclerotic heart disease of native coronary artery without angina pectoris: Secondary | ICD-10-CM | POA: Diagnosis present

## 2017-01-31 DIAGNOSIS — D500 Iron deficiency anemia secondary to blood loss (chronic): Secondary | ICD-10-CM | POA: Diagnosis not present

## 2017-01-31 DIAGNOSIS — Z950 Presence of cardiac pacemaker: Secondary | ICD-10-CM | POA: Diagnosis present

## 2017-01-31 DIAGNOSIS — F329 Major depressive disorder, single episode, unspecified: Secondary | ICD-10-CM | POA: Diagnosis present

## 2017-01-31 DIAGNOSIS — I5022 Chronic systolic (congestive) heart failure: Secondary | ICD-10-CM | POA: Diagnosis not present

## 2017-01-31 DIAGNOSIS — Z95810 Presence of automatic (implantable) cardiac defibrillator: Secondary | ICD-10-CM

## 2017-01-31 DIAGNOSIS — F411 Generalized anxiety disorder: Secondary | ICD-10-CM | POA: Diagnosis present

## 2017-01-31 DIAGNOSIS — G43909 Migraine, unspecified, not intractable, without status migrainosus: Secondary | ICD-10-CM | POA: Diagnosis present

## 2017-01-31 DIAGNOSIS — Z90710 Acquired absence of both cervix and uterus: Secondary | ICD-10-CM | POA: Diagnosis not present

## 2017-01-31 DIAGNOSIS — K922 Gastrointestinal hemorrhage, unspecified: Secondary | ICD-10-CM | POA: Diagnosis present

## 2017-01-31 DIAGNOSIS — I10 Essential (primary) hypertension: Secondary | ICD-10-CM | POA: Diagnosis present

## 2017-01-31 DIAGNOSIS — S72012A Unspecified intracapsular fracture of left femur, initial encounter for closed fracture: Principal | ICD-10-CM | POA: Diagnosis present

## 2017-01-31 DIAGNOSIS — Z8261 Family history of arthritis: Secondary | ICD-10-CM

## 2017-01-31 DIAGNOSIS — Z833 Family history of diabetes mellitus: Secondary | ICD-10-CM

## 2017-01-31 DIAGNOSIS — I4819 Other persistent atrial fibrillation: Secondary | ICD-10-CM | POA: Diagnosis present

## 2017-01-31 DIAGNOSIS — Z85118 Personal history of other malignant neoplasm of bronchus and lung: Secondary | ICD-10-CM

## 2017-01-31 DIAGNOSIS — I255 Ischemic cardiomyopathy: Secondary | ICD-10-CM | POA: Diagnosis present

## 2017-01-31 DIAGNOSIS — Z880 Allergy status to penicillin: Secondary | ICD-10-CM

## 2017-01-31 DIAGNOSIS — Z01810 Encounter for preprocedural cardiovascular examination: Secondary | ICD-10-CM | POA: Diagnosis not present

## 2017-01-31 DIAGNOSIS — J432 Centrilobular emphysema: Secondary | ICD-10-CM | POA: Diagnosis present

## 2017-01-31 DIAGNOSIS — K219 Gastro-esophageal reflux disease without esophagitis: Secondary | ICD-10-CM | POA: Diagnosis present

## 2017-01-31 DIAGNOSIS — Z825 Family history of asthma and other chronic lower respiratory diseases: Secondary | ICD-10-CM

## 2017-01-31 DIAGNOSIS — Z961 Presence of intraocular lens: Secondary | ICD-10-CM | POA: Diagnosis present

## 2017-01-31 DIAGNOSIS — Z9842 Cataract extraction status, left eye: Secondary | ICD-10-CM

## 2017-01-31 DIAGNOSIS — M109 Gout, unspecified: Secondary | ICD-10-CM | POA: Diagnosis present

## 2017-01-31 DIAGNOSIS — Z87891 Personal history of nicotine dependence: Secondary | ICD-10-CM

## 2017-01-31 DIAGNOSIS — W010XXA Fall on same level from slipping, tripping and stumbling without subsequent striking against object, initial encounter: Secondary | ICD-10-CM | POA: Diagnosis present

## 2017-01-31 DIAGNOSIS — I959 Hypotension, unspecified: Secondary | ICD-10-CM | POA: Diagnosis not present

## 2017-01-31 DIAGNOSIS — I252 Old myocardial infarction: Secondary | ICD-10-CM | POA: Diagnosis not present

## 2017-01-31 DIAGNOSIS — E1122 Type 2 diabetes mellitus with diabetic chronic kidney disease: Secondary | ICD-10-CM | POA: Diagnosis present

## 2017-01-31 DIAGNOSIS — Z9841 Cataract extraction status, right eye: Secondary | ICD-10-CM

## 2017-01-31 DIAGNOSIS — Z91040 Latex allergy status: Secondary | ICD-10-CM

## 2017-01-31 DIAGNOSIS — C3411 Malignant neoplasm of upper lobe, right bronchus or lung: Secondary | ICD-10-CM | POA: Diagnosis present

## 2017-01-31 DIAGNOSIS — Z888 Allergy status to other drugs, medicaments and biological substances status: Secondary | ICD-10-CM

## 2017-01-31 DIAGNOSIS — Z881 Allergy status to other antibiotic agents status: Secondary | ICD-10-CM

## 2017-01-31 DIAGNOSIS — N184 Chronic kidney disease, stage 4 (severe): Secondary | ICD-10-CM | POA: Diagnosis present

## 2017-01-31 DIAGNOSIS — I313 Pericardial effusion (noninflammatory): Secondary | ICD-10-CM | POA: Diagnosis present

## 2017-01-31 DIAGNOSIS — Z923 Personal history of irradiation: Secondary | ICD-10-CM | POA: Diagnosis not present

## 2017-01-31 DIAGNOSIS — I130 Hypertensive heart and chronic kidney disease with heart failure and stage 1 through stage 4 chronic kidney disease, or unspecified chronic kidney disease: Secondary | ICD-10-CM | POA: Diagnosis present

## 2017-01-31 DIAGNOSIS — Z8249 Family history of ischemic heart disease and other diseases of the circulatory system: Secondary | ICD-10-CM

## 2017-01-31 DIAGNOSIS — I481 Persistent atrial fibrillation: Secondary | ICD-10-CM | POA: Diagnosis present

## 2017-01-31 DIAGNOSIS — D631 Anemia in chronic kidney disease: Secondary | ICD-10-CM | POA: Diagnosis present

## 2017-01-31 DIAGNOSIS — Z8781 Personal history of (healed) traumatic fracture: Secondary | ICD-10-CM

## 2017-01-31 DIAGNOSIS — M25552 Pain in left hip: Secondary | ICD-10-CM | POA: Diagnosis present

## 2017-01-31 DIAGNOSIS — S72002A Fracture of unspecified part of neck of left femur, initial encounter for closed fracture: Secondary | ICD-10-CM

## 2017-01-31 DIAGNOSIS — E119 Type 2 diabetes mellitus without complications: Secondary | ICD-10-CM

## 2017-01-31 DIAGNOSIS — Z7951 Long term (current) use of inhaled steroids: Secondary | ICD-10-CM

## 2017-01-31 DIAGNOSIS — Z9889 Other specified postprocedural states: Secondary | ICD-10-CM

## 2017-01-31 DIAGNOSIS — R34 Anuria and oliguria: Secondary | ICD-10-CM | POA: Diagnosis not present

## 2017-01-31 DIAGNOSIS — Z885 Allergy status to narcotic agent status: Secondary | ICD-10-CM

## 2017-01-31 DIAGNOSIS — Z882 Allergy status to sulfonamides status: Secondary | ICD-10-CM

## 2017-01-31 LAB — COMPREHENSIVE METABOLIC PANEL
ALBUMIN: 3.2 g/dL — AB (ref 3.5–5.0)
ALT: 13 U/L — AB (ref 14–54)
ANION GAP: 11 (ref 5–15)
AST: 26 U/L (ref 15–41)
Alkaline Phosphatase: 51 U/L (ref 38–126)
BUN: 53 mg/dL — ABNORMAL HIGH (ref 6–20)
CALCIUM: 9 mg/dL (ref 8.9–10.3)
CHLORIDE: 102 mmol/L (ref 101–111)
CO2: 27 mmol/L (ref 22–32)
Creatinine, Ser: 1.75 mg/dL — ABNORMAL HIGH (ref 0.44–1.00)
GFR calc non Af Amer: 26 mL/min — ABNORMAL LOW (ref >60)
GFR, EST AFRICAN AMERICAN: 30 mL/min — AB (ref >60)
Glucose, Bld: 116 mg/dL — ABNORMAL HIGH (ref 65–99)
Potassium: 5.1 mmol/L (ref 3.5–5.1)
Sodium: 140 mmol/L (ref 135–145)
Total Bilirubin: 1.2 mg/dL (ref 0.3–1.2)
Total Protein: 6.7 g/dL (ref 6.5–8.1)

## 2017-01-31 LAB — CBC WITH DIFFERENTIAL/PLATELET
BASOS ABS: 0.1 10*3/uL (ref 0–0.1)
BASOS PCT: 1 %
Eosinophils Absolute: 0.1 10*3/uL (ref 0–0.7)
Eosinophils Relative: 1 %
HEMATOCRIT: 29 % — AB (ref 35.0–47.0)
HEMOGLOBIN: 9.5 g/dL — AB (ref 12.0–16.0)
LYMPHS PCT: 3 %
Lymphs Abs: 0.4 10*3/uL — ABNORMAL LOW (ref 1.0–3.6)
MCH: 27.1 pg (ref 26.0–34.0)
MCHC: 32.9 g/dL (ref 32.0–36.0)
MCV: 82.5 fL (ref 80.0–100.0)
MONO ABS: 0.5 10*3/uL (ref 0.2–0.9)
Monocytes Relative: 5 %
NEUTROS ABS: 9.5 10*3/uL — AB (ref 1.4–6.5)
NEUTROS PCT: 90 %
Platelets: 253 10*3/uL (ref 150–440)
RBC: 3.52 MIL/uL — AB (ref 3.80–5.20)
RDW: 17.6 % — AB (ref 11.5–14.5)
WBC: 10.6 10*3/uL (ref 3.6–11.0)

## 2017-01-31 LAB — C4 COMPLEMENT: COMPLEMENT C4, BODY FLUID: 39 mg/dL (ref 14–44)

## 2017-01-31 LAB — URINALYSIS, ROUTINE W REFLEX MICROSCOPIC
BILIRUBIN URINE: NEGATIVE
Glucose, UA: NEGATIVE mg/dL
HGB URINE DIPSTICK: NEGATIVE
Ketones, ur: NEGATIVE mg/dL
Leukocytes, UA: NEGATIVE
Nitrite: NEGATIVE
Protein, ur: NEGATIVE mg/dL
SPECIFIC GRAVITY, URINE: 1.014 (ref 1.005–1.030)
pH: 5 (ref 5.0–8.0)

## 2017-01-31 LAB — CARDIOLIPIN ANTIBODIES, IGG, IGM, IGA
Anticardiolipin IgA: <9 APL U/mL (ref 0–11)
Anticardiolipin IgM: 18 MPL U/mL — ABNORMAL HIGH (ref 0–12)

## 2017-01-31 LAB — GLUCOSE, CAPILLARY
GLUCOSE-CAPILLARY: 117 mg/dL — AB (ref 65–99)
Glucose-Capillary: 102 mg/dL — ABNORMAL HIGH (ref 65–99)

## 2017-01-31 LAB — PROTIME-INR
INR: 1.37
Prothrombin Time: 17 seconds — ABNORMAL HIGH (ref 11.4–15.2)

## 2017-01-31 LAB — C3 COMPLEMENT: C3 COMPLEMENT: 167 mg/dL (ref 82–167)

## 2017-01-31 MED ORDER — ALPRAZOLAM 0.5 MG PO TABS
0.5000 mg | ORAL_TABLET | Freq: Every day | ORAL | Status: DC
  Administered 2017-01-31 – 2017-02-04 (×5): 0.5 mg via ORAL
  Filled 2017-01-31 (×5): qty 1

## 2017-01-31 MED ORDER — INSULIN ASPART 100 UNIT/ML ~~LOC~~ SOLN
0.0000 [IU] | Freq: Three times a day (TID) | SUBCUTANEOUS | Status: DC
Start: 2017-01-31 — End: 2017-02-05
  Administered 2017-02-01: 2 [IU] via SUBCUTANEOUS
  Administered 2017-02-02 – 2017-02-05 (×5): 1 [IU] via SUBCUTANEOUS
  Filled 2017-01-31 (×3): qty 1
  Filled 2017-01-31: qty 2
  Filled 2017-01-31 (×2): qty 1

## 2017-01-31 MED ORDER — TIOTROPIUM BROMIDE MONOHYDRATE 18 MCG IN CAPS
18.0000 ug | ORAL_CAPSULE | Freq: Every day | RESPIRATORY_TRACT | Status: DC
  Administered 2017-01-31 – 2017-02-04 (×4): 18 ug via RESPIRATORY_TRACT
  Filled 2017-01-31: qty 5

## 2017-01-31 MED ORDER — FLUTICASONE FUROATE-VILANTEROL 100-25 MCG/INH IN AEPB
1.0000 | INHALATION_SPRAY | Freq: Every day | RESPIRATORY_TRACT | Status: DC
  Administered 2017-02-02 – 2017-02-05 (×4): 1 via RESPIRATORY_TRACT
  Filled 2017-01-31: qty 28

## 2017-01-31 MED ORDER — NITROGLYCERIN 0.4 MG SL SUBL: 0.4000 mg | SUBLINGUAL_TABLET | SUBLINGUAL | Status: DC | PRN

## 2017-01-31 MED ORDER — VITAMIN B-12 100 MCG PO TABS
500.0000 ug | ORAL_TABLET | Freq: Every day | ORAL | Status: DC
  Administered 2017-02-02 – 2017-02-04 (×3): 500 ug via ORAL
  Filled 2017-01-31 (×4): qty 5

## 2017-01-31 MED ORDER — ACETAMINOPHEN 650 MG RE SUPP: 650.0000 mg | Freq: Four times a day (QID) | RECTAL | Status: DC | PRN

## 2017-01-31 MED ORDER — SODIUM CHLORIDE 0.9 % IV SOLN
INTRAVENOUS | Status: DC
  Administered 2017-02-01: via INTRAVENOUS

## 2017-01-31 MED ORDER — INSULIN ASPART 100 UNIT/ML ~~LOC~~ SOLN: 0.0000 [IU] | Freq: Every day | SUBCUTANEOUS | Status: DC

## 2017-01-31 MED ORDER — FENTANYL CITRATE (PF) 100 MCG/2ML IJ SOLN
50.0000 ug | Freq: Once | INTRAMUSCULAR | Status: AC
  Administered 2017-01-31: 50 ug via INTRAVENOUS
  Filled 2017-01-31: qty 2

## 2017-01-31 MED ORDER — ONDANSETRON HCL 4 MG/2ML IJ SOLN
4.0000 mg | Freq: Four times a day (QID) | INTRAMUSCULAR | Status: DC | PRN
  Administered 2017-02-01: 4 mg via INTRAVENOUS
  Filled 2017-01-31: qty 2

## 2017-01-31 MED ORDER — ATORVASTATIN CALCIUM 20 MG PO TABS
20.0000 mg | ORAL_TABLET | Freq: Every evening | ORAL | Status: DC
  Administered 2017-02-01 – 2017-02-04 (×4): 20 mg via ORAL
  Filled 2017-01-31 (×4): qty 1

## 2017-01-31 MED ORDER — CHOLECALCIFEROL 10 MCG (400 UNIT) PO TABS
400.0000 [IU] | ORAL_TABLET | Freq: Every day | ORAL | Status: DC
  Administered 2017-02-02 – 2017-02-04 (×3): 400 [IU] via ORAL
  Filled 2017-01-31 (×4): qty 1

## 2017-01-31 MED ORDER — PANTOPRAZOLE SODIUM 40 MG PO TBEC
40.0000 mg | DELAYED_RELEASE_TABLET | Freq: Every evening | ORAL | Status: DC
  Administered 2017-01-31 – 2017-02-04 (×5): 40 mg via ORAL
  Filled 2017-01-31 (×5): qty 1

## 2017-01-31 MED ORDER — FENTANYL CITRATE (PF) 100 MCG/2ML IJ SOLN
25.0000 ug | INTRAMUSCULAR | Status: DC | PRN
  Administered 2017-01-31 – 2017-02-01 (×4): 25 ug via INTRAVENOUS
  Filled 2017-01-31 (×4): qty 2

## 2017-01-31 MED ORDER — VANCOMYCIN HCL IN DEXTROSE 1-5 GM/200ML-% IV SOLN
1000.0000 mg | INTRAVENOUS | Status: AC
  Administered 2017-02-01: 1000 mg via INTRAVENOUS
  Filled 2017-01-31: qty 200

## 2017-01-31 MED ORDER — FUROSEMIDE 40 MG PO TABS
40.0000 mg | ORAL_TABLET | Freq: Every day | ORAL | Status: DC
  Administered 2017-02-02 – 2017-02-03 (×2): 40 mg via ORAL
  Filled 2017-01-31 (×2): qty 1

## 2017-01-31 MED ORDER — ACETAMINOPHEN 325 MG PO TABS: 650.0000 mg | ORAL_TABLET | Freq: Four times a day (QID) | ORAL | Status: DC | PRN

## 2017-01-31 MED ORDER — FERROUS SULFATE 325 (65 FE) MG PO TABS
325.0000 mg | ORAL_TABLET | Freq: Every day | ORAL | Status: DC
  Administered 2017-02-02 – 2017-02-04 (×3): 325 mg via ORAL
  Filled 2017-01-31 (×4): qty 1

## 2017-01-31 MED ORDER — OCUVITE-LUTEIN PO CAPS
2.0000 | ORAL_CAPSULE | Freq: Every day | ORAL | Status: DC
  Administered 2017-02-02 – 2017-02-05 (×4): 2 via ORAL
  Filled 2017-01-31 (×4): qty 2

## 2017-01-31 MED ORDER — SERTRALINE HCL 50 MG PO TABS
50.0000 mg | ORAL_TABLET | Freq: Every day | ORAL | Status: DC
  Administered 2017-01-31 – 2017-02-05 (×6): 50 mg via ORAL
  Filled 2017-01-31 (×6): qty 1

## 2017-01-31 MED ORDER — ALPRAZOLAM 0.5 MG PO TABS: 0.5000 mg | ORAL_TABLET | Freq: Every evening | ORAL | Status: DC | PRN

## 2017-01-31 MED ORDER — ONDANSETRON HCL 4 MG PO TABS
4.0000 mg | ORAL_TABLET | Freq: Four times a day (QID) | ORAL | Status: DC | PRN
  Administered 2017-02-03: 4 mg via ORAL
  Filled 2017-01-31: qty 1

## 2017-01-31 MED ORDER — FENOFIBRATE 54 MG PO TABS
54.0000 mg | ORAL_TABLET | Freq: Every day | ORAL | Status: DC
  Administered 2017-02-02 – 2017-02-04 (×3): 54 mg via ORAL
  Filled 2017-01-31 (×5): qty 1

## 2017-01-31 MED ORDER — TRAMADOL HCL 50 MG PO TABS
50.0000 mg | ORAL_TABLET | ORAL | Status: DC | PRN
  Administered 2017-01-31: 50 mg via ORAL
  Filled 2017-01-31: qty 1

## 2017-01-31 MED ORDER — ALBUTEROL SULFATE (2.5 MG/3ML) 0.083% IN NEBU
2.5000 mg | INHALATION_SOLUTION | RESPIRATORY_TRACT | Status: DC | PRN
  Filled 2017-01-31: qty 3

## 2017-01-31 MED ORDER — METOPROLOL SUCCINATE ER 25 MG PO TB24
12.5000 mg | ORAL_TABLET | Freq: Every day | ORAL | Status: DC
  Administered 2017-01-31 – 2017-02-05 (×6): 12.5 mg via ORAL
  Filled 2017-01-31 (×6): qty 1

## 2017-01-31 NOTE — ED Notes (Signed)
Admitting at bedside 

## 2017-01-31 NOTE — ED Notes (Signed)
Pt wears 2 L nasal cannula all the time. Oxygen tank on bed changed by this RN

## 2017-01-31 NOTE — H&P (Signed)
[facility] [place] NAME: Regina Burke    MR#:  [id number]  DATE OF BIRTH:  October 17, 1932  DATE OF ADMISSION:  01/31/2017  PRIMARY CARE PHYSICIAN: Tracie Harrier, MD   REQUESTING/REFERRING PHYSICIAN: Drenda Freeze, MD  CHIEF COMPLAINT:   fall HISTORY OF PRESENT ILLNESS:  Regina Burke  is a 83 y.o. female with a known history of Anemia, GI bleed, COPD, chronic atrial fibrillation who was just admitted for GI bleed and discharged from [facility]. Her anticoagulants were discontinued at the time of recent discharge because of the GI bleed. Patient sees Dr. Rockey Situ for chronic atrial fibrillation This morning, the visiting nurse came and see her and she opened the door and tripped over the threshold and fell backwards onto her left hip. Denies passing out or head injury X-ray has revealed left femoral neck fracture, orthopedics is planning to do surgery tomorrow  PAST MEDICAL HISTORY:   Past Medical History:  Diagnosis Date  . Anemia   . Arthritis   . Asthma   . Cardiac resynchronization therapy pacemaker (CRT-P) in place    a. 03/31/16:  Medtronic Percepta Quad CRT-P MRI SureScan (serial Number [id number]) device.  . Chronic diastolic CHF (congestive heart failure) ([place])    a. 10/2015 Echo: EF 55-65%, Gr1 DD, mild MR, mildly dil LA, nl RV fxn, nl PASP;  b. 03/2016 Echo: EF 30-35%, mod MR, mod dil LA; c. 12/2016 Echo: EF 60-65%, no rwma, mild AS, mildly dil LA, PASP 43mmHg.  . CKD (chronic kidney disease), stage IV ([place])   . COPD (chronic obstructive pulmonary disease) ([place])   . Coronary artery disease    a. 11/2014 NSTEMI/PCI: LM nl, LAD 68m, D1 30, LCX mild dzs, OM1 20p, OM2 21m, OM3 90p (2.25x8 Promus Premier DES), RCA nl.   . Cough    CHRONIC AT NIGHT  . Cushing's disease ([place])   . Depression   . Diverticulitis   . Edema    FEET/LEGS  . GERD (gastroesophageal reflux disease)   . Gout   . History of hiatal  hernia   . History of pneumonia   . HLD (hyperlipidemia)   . HOH (hard of hearing)   . Hypertensive heart disease   . Lung cancer ([place]) dx'd 2014   S/P radiation 2015  . Migraine   . Mixed Ischemic and Nonischemic Cardiomyopathy ([place])    a. 03/2016 Echo: EF 30-35%;  b. 12/2016 Echo: EF 60-65%.  . Moderate mitral regurgitation   . Multiple allergies   . Myocardial infarction ([place])   . Oxygen deficiency    2LITERS  . Persistent atrial fibrillation ([place])    a. CHADS2VASc ==> 7 (CHF, HTN, age x 2, DM, vascular disease, and gender)--was on renal dosed eliquis but this was d/c'd in 12/2016 in setting of dark stools/anemia;  c. 03/2016 s/p AVN and MDT BiV ICD placement.  . Presence of permanent cardiac pacemaker   . S/P AV nodal ablation    a. on 03/31/16 for persistant afib with CRT-P placement  . Sleep apnea   . Type II diabetes mellitus ([place])     PAST SURGICAL HISTOIRY:   Past Surgical History:  Procedure Laterality Date  . ABDOMINAL HYSTERECTOMY    . ABLATION     July 2017  . ADRENALECTOMY Left 1980's   "[place]"  . APPENDECTOMY    . BREAST CYST EXCISION Left   . CATARACT EXTRACTION W/PHACO Right 12/28/2015   Procedure:  CATARACT EXTRACTION PHACO AND INTRAOCULAR LENS PLACEMENT (IOC);  Surgeon: Birder Robson, MD;  Location: [facility] ORS;  Service: Ophthalmology;  Laterality: Right;  [place] 48.4   . CATARACT EXTRACTION W/PHACO Left 11/14/2016   Procedure: CATARACT EXTRACTION PHACO AND INTRAOCULAR LENS PLACEMENT (IOC);  Surgeon: Birder Robson, MD;  Location: [facility] ORS;  Service: Ophthalmology;  Laterality: Left;  [place] 59.8 AP% 18.1 CDE 10.78 Fluid pack lot # [id number]  . CHOLECYSTECTOMY    . CORONARY ANGIOPLASTY WITH STENT PLACEMENT  12/23/2014  . ELECTROPHYSIOLOGIC STUDY N/A 03/08/2016   Procedure: CARDIOVERSION;  Surgeon: Wende Bushy, MD;  Location: [facility] ORS;  Service: Cardiovascular;  Laterality: N/A;  . ELECTROPHYSIOLOGIC STUDY N/A 03/07/2016   Procedure: Cardioversion;  Surgeon: Wende Bushy, MD;  Location: [facility] ORS;  Service: Cardiovascular;  Laterality: N/A;  . ELECTROPHYSIOLOGIC STUDY N/A 03/31/2016   Procedure: AV Node Ablation;  Surgeon: Will Meredith Leeds, MD;  Location: [place] CV LAB;  Service: Cardiovascular;  Laterality: N/A;  . EP IMPLANTABLE DEVICE N/A 03/31/2016   Procedure: BiV Pacemaker Insertion CRT-P;  Surgeon: Will Meredith Leeds, MD;  Location: [place] [facility] LAB;  Service: Cardiovascular;  Laterality: N/A;  . ESOPHAGOGASTRODUODENOSCOPY (EGD) WITH PROPOFOL N/A 01/26/2017   Procedure: ESOPHAGOGASTRODUODENOSCOPY (EGD) WITH PROPOFOL;  Surgeon: [name], MD;  Location: [facility] ENDOSCOPY;  Service: Endoscopy;  Laterality: N/A;  . FRACTURE SURGERY    . INSERT / REPLACE / REMOVE PACEMAKER  02/2016  . LEFT HEART CATHETERIZATION WITH CORONARY ANGIOGRAM N/A 12/23/2014   Procedure: LEFT HEART CATHETERIZATION WITH CORONARY ANGIOGRAM;  Surgeon: Burnell Blanks, MD;  Location[facility] CATH LAB;  Service: Cardiovascular;  Laterality: N/A;  . PERCUTANEOUS CORONARY STENT INTERVENTION (PCI-S)  12/23/2014   Procedure: PERCUTANEOUS CORONARY STENT INTERVENTION (PCI-S);  Surgeon: Burnell Blanks, MD;  Location: [facility] CATH LAB;  Service: Cardiovascular;;  Promus 2.25x8  . TONSILLECTOMY    . TRANSTHORACIC ECHOCARDIOGRAM  11/26/2015   Technically difficult study. EF 55-60%. Normal wall motion. GR 1 DD.  . TUBAL LIGATION    . WRIST FRACTURE SURGERY Bilateral ~ 2000    SOCIAL HISTORY:   Social History  Substance Use Topics  . Smoking status: Former Smoker    Packs/day: 1.00    Years: 45.00    Types: Cigarettes    Quit date: 04/25/1994  . Smokeless tobacco: Never Used  . Alcohol use No     Comment: 12/23/2014 "might have a couple mixed drinks/year"    FAMILY HISTORY:   Family History  Problem Relation Age of Onset  . Heart disease Mother   . Diabetes Mother   . Osteoarthritis Mother   . Hypertension Mother   . Heart disease Father   . Hypertension Father   .  COPD Brother     DRUG ALLERGIES:   Allergies  Allergen Reactions  . Ciprofloxacin Shortness Of Breath, Itching and Rash  . Doxycycline Shortness Of Breath, Itching and Rash  . Penicillins Shortness Of Breath, Itching, Rash and Other (See Comments)    Has patient had a PCN reaction causing immediate rash, facial/tongue/throat swelling, SOB or lightheadedness with hypotension: Yes Has patient had a PCN reaction causing severe rash involving mucus membranes or skin necrosis: No Has patient had a PCN reaction that required hospitalization No Has patient had a PCN reaction occurring within the last 10 years: No If all of the above answers are "NO", then may proceed with Cephalosporin use.  . Sulfa Antibiotics Shortness Of Breath, Itching and Rash  . Latex Itching  . Morphine  And Related Itching  . Albuterol Sulfate   . Cefuroxime Rash    Blisters in mouth    REVIEW OF SYSTEMS:  CONSTITUTIONAL: No fever, fatigue or weakness.  EYES: No blurred or double vision.  EARS, NOSE, AND THROAT: No tinnitus or ear pain.  RESPIRATORY: No cough, shortness of breath, wheezing or hemoptysis.  CARDIOVASCULAR: No chest pain, orthopnea, edema.  GASTROINTESTINAL: No nausea, vomiting, diarrhea or abdominal pain.  GENITOURINARY: No dysuria, hematuria.  ENDOCRINE: No polyuria, nocturia,  HEMATOLOGY: No anemia, easy bruising or bleeding SKIN: No rash or lesion. MUSCULOSKELETAL:Left hip pain NEUROLOGIC: No tingling, numbness, weakness.  PSYCHIATRY: No anxiety or depression.   MEDICATIONS AT HOME:   Prior to Admission medications   Medication Sig Start Date End Date Taking? Authorizing Provider  ALPRAZolam Duanne Moron) 0.5 MG tablet Take 0.5 mg by mouth at bedtime. 12/27/16  Yes [provider]  atorvastatin (LIPITOR) 20 MG tablet Take 1 tablet (20 mg total) by mouth every evening. 12/03/15  Yes Gollan, Kathlene November, MD  cholecalciferol (VITAMIN D) 400 UNITS TABS tablet Take 400 Units by mouth daily.     Yes [provider]  cyanocobalamin 500 MCG tablet Take 500 mcg by mouth daily.    Yes [provider]  fenofibrate (TRICOR) 48 MG tablet Take 48 mg by mouth every evening.    Yes [provider]  ferrous sulfate 325 (65 FE) MG EC tablet Take 325 mg by mouth daily with breakfast.    Yes [provider]  fluticasone furoate-vilanterol (BREO ELLIPTA) 100-25 MCG/INH AEPB Inhale 1 puff into the lungs daily as needed.    Yes [provider]  furosemide (LASIX) 40 MG tablet Take 1 tablet (40 mg total) by mouth daily. 04/01/16  Yes Eileen Stanford, PA-C  metoprolol succinate (TOPROL XL) 25 MG 24 hr tablet Take 0.5 tablets (12.5 mg total) by mouth daily. 12/19/16  Yes Minna Merritts, MD  Multiple Vitamins-Minerals (PRESERVISION AREDS 2) CAPS Take 1 capsule by mouth daily.   Yes [provider]  nitroGLYCERIN (NITROSTAT) 0.4 MG SL tablet Place 1 tablet (0.4 mg total) under the tongue every 5 (five) minutes as needed for chest pain. 12/24/14  Yes Barrett, Evelene Croon, PA-C  pantoprazole (PROTONIX) 40 MG tablet Take 40 mg by mouth every evening.    Yes [provider]  sertraline (ZOLOFT) 50 MG tablet Take 1 tablet (50 mg total) by mouth daily. 03/24/16  Yes Gladstone Lighter, MD  tiotropium (SPIRIVA) 18 MCG inhalation capsule Place 18 mcg into inhaler and inhale daily as needed.     [provider]  traMADol (ULTRAM) 50 MG tablet Take 50 mg by mouth every 6 (six) hours as needed.    [provider]      VITAL SIGNS:  Blood pressure (!) 151/59, pulse 70, temperature 98.2 F (36.8 C), temperature source Oral, resp. rate 18, height 5\' 4"  (1.626 m), weight 81.2 kg (179 lb), SpO2 98 %.  PHYSICAL EXAMINATION:  GENERAL:  83 y.o.-year-old patient lying in the bed with no acute distress.  EYES: Pupils equal, round, reactive to light and accommodation. No scleral icterus. Extraocular muscles intact.  HEENT: Head atraumatic,  normocephalic. Oropharynx and nasopharynx clear.  NECK:  Supple, no jugular venous distention. No thyroid enlargement, no tenderness.  LUNGS: Normal breath sounds bilaterally, no wheezing, rales,rhonchi or crepitation. No use of accessory muscles of respiration.  CARDIOVASCULAR: S1, S2 normal. No murmurs, rubs, or gallops.  ABDOMEN: Soft, nontender, nondistended. Bowel sounds present.  No organomegaly or mass.  EXTREMITIES: Left hip is tender, externally rotated. No cyanosis  NEUROLOGIC: Cranial nerves II through XII are intact. Muscle strength 5/5 in all extremities. Sensation intact. Gait not checked.  PSYCHIATRIC: The patient is alert and oriented x 3.  SKIN: No obvious rash, lesion, or ulcer.   LABORATORY PANEL:   CBC  Recent Labs Lab 01/31/17 1147  WBC 10.6  HGB 9.5*  HCT 29.0*  PLT 253   ------------------------------------------------------------------------------------------------------------------  Chemistries   Recent Labs Lab 01/31/17 1147  NA 140  K 5.1  CL 102  CO2 27  GLUCOSE 116*  BUN 53*  CREATININE 1.75*  CALCIUM 9.0  AST 26  ALT 13*  ALKPHOS 51  BILITOT 1.2   ------------------------------------------------------------------------------------------------------------------  Cardiac Enzymes No results for input(s): TROPONINI in the last 168 hours. ------------------------------------------------------------------------------------------------------------------  RADIOLOGY:  Ct Abdomen Pelvis Wo Contrast  Result Date: 01/29/2017 CLINICAL DATA:  83 year old female with history of acute renal failure. Hematuria. Anemia. EXAM: CT ABDOMEN AND PELVIS WITHOUT CONTRAST TECHNIQUE: Multidetector CT imaging of the abdomen and pelvis was performed following the standard protocol without IV contrast. COMPARISON:  CT the abdomen and pelvis 06/27/2013. FINDINGS: Lower chest: Mild cardiomegaly. Small amount of pericardial fluid and/or thickening. No pericardial  calcification. Biventricular pacemaker leads. Aortic atherosclerosis. Small left and trace right pleural effusions. Passive subsegmental atelectasis in left lower lobe. Hepatobiliary: Tiny calcified granuloma in the left lobe of the liver. No definite cystic or solid hepatic lesions are identified on today's noncontrast CT examination. Status post cholecystectomy. Pancreas: No pancreatic mass or peripancreatic inflammatory changes are noted on today's noncontrast CT examination. Spleen: Multiple well-defined low-attenuation splenic lesions are again noted. These are very similar to numerous prior examinations dating back to 2011, with the largest of these lesions measuring up to 5.8 x 5.3 cm. These are incompletely characterized on today's noncontrast CT examination, but are presumably benign. Spleen remains enlarged measuring up to 10.6 x 9.0 x 15.8 cm (estimated splenic volume of 754 mL). Adrenals/Urinary Tract: No calculi are identified within the collecting system of either kidney, along the course of either ureter, or within the lumen of the urinary bladder. Atrophy of the kidneys bilaterally. Multiple lesions in both kidneys of varying degrees of complexity ranging from low to high attenuation, presumably a combination of small cysts and proteinaceous/hemorrhagic cysts, but incompletely characterized on today's noncontrast CT examination. No hydroureteronephrosis. Urinary bladder is normal in appearance. Left adrenal gland is normal in appearance. 1.9 cm low-attenuation (9 Hounsfield unit) right adrenal nodule is similar to prior examinations, compatible with an adenoma. Stomach/Bowel: Unenhanced appearance of the stomach is normal. There is no pathologic dilatation of small bowel or colon. A few scattered colonic diverticulae are noted, without surrounding inflammatory changes to suggest an acute diverticulitis at this time. The appendix is not confidently identified and may be surgically absent. Regardless,  there are no inflammatory changes noted adjacent to the cecum to suggest the presence of an acute appendicitis at this time. Vascular/Lymphatic: Aortic atherosclerosis, without definite aneurysm in the abdominal or pelvic vasculature. Right femoral all PermCath with tip terminating in the inferior vena cava shortly beneath the level of the renal veins. No lymphadenopathy noted in the abdomen or pelvis. Reproductive: Status posthysterectomy. Ovaries are not confidently identified may be surgically absent or atrophic. Other: No significant volume of ascites.  No pneumoperitoneum. Musculoskeletal: There are no aggressive appearing lytic or blastic lesions noted in the visualized portions of the skeleton. IMPRESSION: 1. No definite source for hematuria  identified on today's study. Specifically, no urinary tract calculi no findings of urinary tract obstruction are noted at this time. 2. However, there are multiple renal lesions bilaterally of varying degrees of complexity. These are favored to represent multiple cysts and proteinaceous/hemorrhagic cysts, and generally appear similar to prior studies. These could be definitively characterized with MRI of the abdomen with and without IV gadolinium if hematuria persists or worsens. 3. Small left and trace right pleural effusions. 4. Small amount of pericardial fluid and/or thickening. 5. Splenomegaly. 6. Atherosclerosis. 7. Colonic diverticulosis without evidence of acute diverticulitis at this time. 8. Additional incidental findings, as above, similar prior studies. Electronically Signed   By: Vinnie Langton M.D.   On: 01/29/2017 14:44   Dg Chest 1 View  Result Date: 01/31/2017 CLINICAL DATA:  Fall with left hip pain EXAM: CHEST 1 VIEW COMPARISON:  01/23/2017 FINDINGS: Chronically enlarged cardiac silhouette. Pacemaker leads appear unchanged. Chronic aortic atherosclerosis. Less pleural fluid on the left with less left base atelectasis. Chronic post treatment scarring  in the right upper lobe as seen previously. IMPRESSION: Radiographic improvement with less left pleural fluid and better aeration of the left lower lobe. Electronically Signed   By: Nelson Chimes M.D.   On: 01/31/2017 11:22   Dg Hip Unilat With Pelvis 2-3 Views Left  Result Date: 01/31/2017 CLINICAL DATA:  [place] today.  Hip pain. EXAM: DG HIP (WITH OR WITHOUT PELVIS) 2-3V LEFT COMPARISON:  None. FINDINGS: Slightly impacted and angulated femoral neck fracture on the left. No intertrochanteric component. No pelvic component. IMPRESSION: Femoral neck fracture with slight impaction and angulation. Electronically Signed   By: Nelson Chimes M.D.   On: 01/31/2017 11:21    EKG:   Orders placed or performed during the hospital encounter of 01/31/17  . EKG 12-Lead  . EKG 12-Lead    IMPRESSION AND PLAN:   Regina Burke  is a 83 y.o. female with a known history of Anemia, GI bleed, COPD, chronic atrial fibrillation who was just admitted for GI bleed and discharged from [facility]. Her anticoagulants were discontinued at the time of recent discharge because of the GI bleed.  #Left femoral neck fracture Admit to MedSurg unit Nothing by mouth after midnight for surgery tomorrow Consult orthopedics Pain management as needed Supportive treatment IV fluids after midnight when patient is nothing by mouth Cardiology Dr. Rockey Situ consulted for cardiac clearance for hip surgery  #Chronic history of atrial fibrillation Rate controlled, continue Toprol Patient is off blood thinners because of the recent history of GI bleed  #History of coronary artery disease status post stent A symptomatic.  Continue home medications Toprol and Lipitor Cardiology consulted for cardiac clearance  #Chronic history of combined systolic and diastolic congestive heart failure Currently not fluid overloaded Continue home medication Lasix Patient will get gentle hydration with IV fluids after midnight while she is  nothing by mouth, watch for fluid overload  #Diabetes mellitus sliding scale insulin       All the records are reviewed and case discussed with ED provider. Management plans discussed with the patient, family and they are in agreement.  CODE STATUS: Full code, son is the healthcare power of attorney  TOTAL TIME TAKING CARE OF THIS PATIENT: 12minutes.   Note: This dictation was prepared with Dragon dictation along with smaller phrase technology. Any transcriptional errors that result from this process are unintentional.  Nicholes Mango M.D on 01/31/2017 at 2:06 PM  Between 7am to 6pm - Pager - [phone]  After 6pm go to [email or web address] - password [email or web address] [place] [facility]  Office  [phone]  CC: Primary care physician; Tracie Harrier, MD

## 2017-01-31 NOTE — ED Notes (Signed)
Pt given cup of water 

## 2017-01-31 NOTE — ED Notes (Signed)
Son asked if pt could have anything to drink. Informed him that she can not drink anything at this time.

## 2017-01-31 NOTE — Care Management (Signed)
Patient is a readmission from [facility] to [facility], [place], [place]. She is on chronic O2. She did not require outpatient dialysis per [facility] with [facility]. RNCM to continue to follow.

## 2017-01-31 NOTE — ED Notes (Signed)
Pt informed that pain medication is ordered every 4 hours.   Pt voiced concerns about not being able to go to surgery today and asked about being transferred to [facility] for surgery today. Pt informed that this RN could not guarantee she could get into surgery today at [facility]. Pt was informed that most times the transfer process takes longer than the admission process. Pt informed she had a bed assigned. Pt stated she would stay here instead of go to [facility].

## 2017-01-31 NOTE — ED Notes (Signed)
Pt returned from xray via stretcher. Visitor at bedside.

## 2017-01-31 NOTE — Consult Note (Signed)
Cardiology Consultation Note  Patient ID: Regina Burke, MRN: [id number], DOB/AGE: [id number] 81 y.o. Admit date: 01/31/2017   Date of Consult: 01/31/2017 Primary Physician: Tracie Harrier, MD Primary Cardiologist: Dr. Rockey Situ, MD Requesting Physician: Dr. Margaretmary Eddy, MD  Chief Complaint: Fall Reason for Consult: Pre-op cardiac evaluation   HPI: [name] is a 83 y.o. female who is being seen today for the evaluation of preoperative evaluation at the request of Dr. Margaretmary Eddy, MD. Patient has a h/o CAD s/p prior NSTEMI and PCI/DES of the OM3 (11/2014), chronic combined CHF (EF 30-35% 03/2016), CKD IV, COPD, HTN, HL, DMII, persistent AFib s/p AVN RFCA & BiV PPM placement, COPD, IDA due to chronic blood loss, OSA, and gout who was recently admitted to [facility] the week prior 4, located admission as detailed below. She returns to [facility] one day after admission after suffering a mechanical fall leading to a fractured left hip. Cardiology is asked to evaluate for preoperative clearance.  Patient recently admitted this prior week for GI bleed requiring transfusion of one unit packed red blood cells, AK I requiring intermittent hemodialysis, acute on chronic combined CHF, demand ischemia, and electrolyte abnormalities. Echocardiogram during that admission showed stable and improved EF of 65-70% when compared to echocardiogram from 01/13/17 and 03/29/16. She was just discharged on 6/5. Unfortunately on the morning of 6/6 patient's home health aide arrived to visit with the patient prior to patient waking up. The patient was frustrated with this. Upon patient opening the door she turned to her side to go back up the one-step that leads from the landing of her front door to the living room and tripped over this one step landing on her left hip. Patient reported, "I heard the bone break." She was brought to [facility] where she was noted to have a left hip fracture requiring surgical intervention. Cardiology is asked to further  evaluate. Upon discussing with the patient and his evening she has not noted any chest pain. Her shortness of breath is stable. She is no longer requiring hemodialysis and is now voiding on her own without issues. She is awaiting results from her recent Hemoccult study. Vitals stable. No EKG for review. Laboratory analysis shows improving serum creatinine of 1.75, albumin 3.2, potassium 5.1, white blood cell count 10.6, hemoglobin 9.5, platelet count 253. Plain film of the hip shows femoral neck fracture with slight impaction and angulation. Chest x-ray shows improvement with less left pleural fluid and better aeration of the left lower lobe.   Past Medical History:  Diagnosis Date  . Anemia   . Arthritis   . Asthma   . Cardiac resynchronization therapy pacemaker (CRT-P) in place    a. 03/31/16:  Medtronic Percepta Quad CRT-P MRI SureScan (serial Number [id number]) device.  . Chronic diastolic CHF (congestive heart failure) ([place])    a. 10/2015 Echo: EF 55-65%, Gr1 DD, mild MR, mildly dil LA, nl RV fxn, nl PASP;  b. 03/2016 Echo: EF 30-35%, mod MR, mod dil LA; c. 12/2016 Echo: EF 60-65%, no rwma, mild AS, mildly dil LA, PASP 67mmHg.  . CKD (chronic kidney disease), stage IV ([place])   . COPD (chronic obstructive pulmonary disease) ([place])   . Coronary artery disease    a. 11/2014 NSTEMI/PCI: LM nl, LAD 54m, D1 30, LCX mild dzs, OM1 20p, OM2 68m, OM3 90p (2.25x8 Promus Premier DES), RCA nl.   . Cough    CHRONIC AT NIGHT  . Cushing's disease ([place])   . Depression   .  Diverticulitis   . Edema    FEET/LEGS  . GERD (gastroesophageal reflux disease)   . Gout   . History of hiatal hernia   . History of pneumonia   . HLD (hyperlipidemia)   . HOH (hard of hearing)   . Hypertensive heart disease   . Lung cancer ([place]) dx'd 2014   S/P radiation 2015  . Migraine   . Mixed Ischemic and Nonischemic Cardiomyopathy ([place])    a. 03/2016 Echo: EF 30-35%;  b. 12/2016 Echo: EF 60-65%.  . Moderate mitral regurgitation    . Multiple allergies   . Myocardial infarction ([place])   . Oxygen deficiency    2LITERS  . Persistent atrial fibrillation ([place])    a. CHADS2VASc ==> 7 (CHF, HTN, age x 2, DM, vascular disease, and gender)--was on renal dosed eliquis but this was d/c'd in 12/2016 in setting of dark stools/anemia;  c. 03/2016 s/p AVN and MDT BiV ICD placement.  . Presence of permanent cardiac pacemaker   . S/P AV nodal ablation    a. on 03/31/16 for persistant afib with CRT-P placement  . Sleep apnea   . Type II diabetes mellitus ([place])       Most Recent Cardiac Studies: TTE 01/25/2017: Study Conclusions  - Limited study to evaluate pericardial effusion. - Pericardium, extracardiac: A small to moderate pericardial   effusion was identified posterior to the heart. The appearance is   similar to the prior echo from 01/13/17. The fluid collection is   not accessible from a subxyphoid or apical approach. There was a   left pleural effusion. - Left ventricle: The cavity size was normal. There was mild   concentric hypertrophy. Systolic function was vigorous. The   estimated ejection fraction was in the range of 65% to 70%. - Mitral valve: Calcified annulus. There was mild regurgitation. - Left atrium: The atrium was moderately dilated.   Surgical History:  Past Surgical History:  Procedure Laterality Date  . ABDOMINAL HYSTERECTOMY    . ABLATION     July 2017  . ADRENALECTOMY Left 1980's   "[place]"  . APPENDECTOMY    . BREAST CYST EXCISION Left   . CATARACT EXTRACTION W/PHACO Right 12/28/2015   Procedure: CATARACT EXTRACTION PHACO AND INTRAOCULAR LENS PLACEMENT (IOC);  Surgeon: Birder Robson, MD;  Location: [facility] ORS;  Service: Ophthalmology;  Laterality: Right;  [place] 48.4   . CATARACT EXTRACTION W/PHACO Left 11/14/2016   Procedure: CATARACT EXTRACTION PHACO AND INTRAOCULAR LENS PLACEMENT (IOC);  Surgeon: Birder Robson, MD;  Location: [facility] ORS;  Service: Ophthalmology;  Laterality: Left;  [place] 59.8 AP%  18.1 CDE 10.78 Fluid pack lot # [id number]  . CHOLECYSTECTOMY    . CORONARY ANGIOPLASTY WITH STENT PLACEMENT  12/23/2014  . ELECTROPHYSIOLOGIC STUDY N/A 03/08/2016   Procedure: CARDIOVERSION;  Surgeon: Wende Bushy, MD;  Location: [facility] ORS;  Service: Cardiovascular;  Laterality: N/A;  . ELECTROPHYSIOLOGIC STUDY N/A 03/07/2016   Procedure: Cardioversion;  Surgeon: Wende Bushy, MD;  Location: [facility] ORS;  Service: Cardiovascular;  Laterality: N/A;  . ELECTROPHYSIOLOGIC STUDY N/A 03/31/2016   Procedure: AV Node Ablation;  Surgeon: Will Meredith Leeds, MD;  Location: [place] CV LAB;  Service: Cardiovascular;  Laterality: N/A;  . EP IMPLANTABLE DEVICE N/A 03/31/2016   Procedure: BiV Pacemaker Insertion CRT-P;  Surgeon: Will Meredith Leeds, MD;  Location: [place] CV [facility];  Service: Cardiovascular;  Laterality: N/A;  . ESOPHAGOGASTRODUODENOSCOPY (EGD) WITH PROPOFOL N/A 01/26/2017   Procedure: ESOPHAGOGASTRODUODENOSCOPY (EGD) WITH PROPOFOL;  Surgeon: [name], MD;  Location: [facility] ENDOSCOPY;  Service: Endoscopy;  Laterality: N/A;  . FRACTURE SURGERY    . INSERT / REPLACE / REMOVE PACEMAKER  02/2016  . LEFT HEART CATHETERIZATION WITH CORONARY ANGIOGRAM N/A 12/23/2014   Procedure: LEFT HEART CATHETERIZATION WITH CORONARY ANGIOGRAM;  Surgeon: Burnell Blanks, MD;  Location: [facility] CATH LAB;  Service: Cardiovascular;  Laterality: N/A;  . PERCUTANEOUS CORONARY STENT INTERVENTION (PCI-S)  12/23/2014   Procedure: PERCUTANEOUS CORONARY STENT INTERVENTION (PCI-S);  Surgeon: Burnell Blanks, MD;  Location: [facility] CATH LAB;  Service: Cardiovascular;;  Promus 2.25x8  . TONSILLECTOMY    . TRANSTHORACIC ECHOCARDIOGRAM  11/26/2015   Technically difficult study. EF 55-60%. Normal wall motion. GR 1 DD.  . TUBAL LIGATION    . WRIST FRACTURE SURGERY Bilateral ~ 2000     Home Meds: Prior to Admission medications   Medication Sig Start Date End Date Taking? Authorizing Provider  ALPRAZolam Duanne Moron) 0.5 MG tablet  Take 0.5 mg by mouth at bedtime. 12/27/16  Yes [provider]  atorvastatin (LIPITOR) 20 MG tablet Take 1 tablet (20 mg total) by mouth every evening. 12/03/15  Yes Gollan, Kathlene November, MD  cholecalciferol (VITAMIN D) 400 UNITS TABS tablet Take 400 Units by mouth daily.    Yes [provider]  cyanocobalamin 500 MCG tablet Take 500 mcg by mouth daily.    Yes [provider]  fenofibrate (TRICOR) 48 MG tablet Take 48 mg by mouth every evening.    Yes [provider]  ferrous sulfate 325 (65 FE) MG EC tablet Take 325 mg by mouth daily with breakfast.    Yes [provider]  fluticasone furoate-vilanterol (BREO ELLIPTA) 100-25 MCG/INH AEPB Inhale 1 puff into the lungs daily as needed.    Yes [provider]  furosemide (LASIX) 40 MG tablet Take 1 tablet (40 mg total) by mouth daily. 04/01/16  Yes Eileen Stanford, PA-C  metoprolol succinate (TOPROL XL) 25 MG 24 hr tablet Take 0.5 tablets (12.5 mg total) by mouth daily. 12/19/16  Yes Minna Merritts, MD  Multiple Vitamins-Minerals (PRESERVISION AREDS 2) CAPS Take 1 capsule by mouth daily.   Yes [provider]  nitroGLYCERIN (NITROSTAT) 0.4 MG SL tablet Place 1 tablet (0.4 mg total) under the tongue every 5 (five) minutes as needed for chest pain. 12/24/14  Yes Barrett, Evelene Croon, PA-C  pantoprazole (PROTONIX) 40 MG tablet Take 40 mg by mouth every evening.    Yes [provider]  sertraline (ZOLOFT) 50 MG tablet Take 1 tablet (50 mg total) by mouth daily. 03/24/16  Yes Gladstone Lighter, MD  tiotropium (SPIRIVA) 18 MCG inhalation capsule Place 18 mcg into inhaler and inhale daily as needed.     [provider]  traMADol (ULTRAM) 50 MG tablet Take 50 mg by mouth every 6 (six) hours as needed.    [provider]    Inpatient Medications:  . ALPRAZolam  0.5 mg Oral QHS  . atorvastatin  20 mg Oral QPM  . cholecalciferol  400 Units Oral Daily  . fenofibrate  54 mg Oral  Daily  . [START ON 02/01/2017] ferrous sulfate  325 mg Oral Q breakfast  . fluticasone furoate-vilanterol  1 puff Inhalation Daily  . furosemide  40 mg Oral Daily  . insulin aspart  0-5 Units Subcutaneous QHS  . insulin aspart  0-9 Units Subcutaneous TID WC  . metoprolol succinate  12.5 mg Oral Daily  . multivitamin-lutein  2 capsule Oral Daily  . pantoprazole  40  mg Oral QPM  . sertraline  50 mg Oral Daily  . tiotropium  18 mcg Inhalation Daily  . cyanocobalamin  500 mcg Oral Daily   . [START ON 02/01/2017] sodium chloride    . [START ON 02/01/2017] vancomycin      Allergies:  Allergies  Allergen Reactions  . Ciprofloxacin Shortness Of Breath, Itching and Rash  . Doxycycline Shortness Of Breath, Itching and Rash  . Penicillins Shortness Of Breath, Itching, Rash and Other (See Comments)    Has patient had a PCN reaction causing immediate rash, facial/tongue/throat swelling, SOB or lightheadedness with hypotension: Yes Has patient had a PCN reaction causing severe rash involving mucus membranes or skin necrosis: No Has patient had a PCN reaction that required hospitalization No Has patient had a PCN reaction occurring within the last 10 years: No If all of the above answers are "NO", then may proceed with Cephalosporin use.  . Sulfa Antibiotics Shortness Of Breath, Itching and Rash  . Latex Itching  . Morphine And Related Itching  . Albuterol Sulfate   . Cefuroxime Rash    Blisters in mouth    Social History   Social History  . Marital status: Widowed    Spouse name: N/A  . Number of children: N/A  . Years of education: N/A   Occupational History  . retired    Social History Main Topics  . Smoking status: Former Smoker    Packs/day: 1.00    Years: 45.00    Types: Cigarettes    Quit date: 04/25/1994  . Smokeless tobacco: Never Used  . Alcohol use No     Comment: 12/23/2014 "might have a couple mixed drinks/year"  . Drug use: No  . Sexual activity: No   Other Topics  Concern  . Not on file   Social History Narrative   Lives locally with son.  Does not routinely exercise.     Family History  Problem Relation Age of Onset  . Heart disease Mother   . Diabetes Mother   . Osteoarthritis Mother   . Hypertension Mother   . Heart disease Father   . Hypertension Father   . COPD Brother      Review of Systems: Review of Systems  Constitutional: Positive for malaise/fatigue. Negative for chills, diaphoresis, fever and weight loss.  HENT: Negative for congestion.   Eyes: Negative for discharge and redness.  Respiratory: Positive for shortness of breath. Negative for cough, hemoptysis, sputum production and wheezing.   Cardiovascular: Negative for chest pain, palpitations, orthopnea, claudication, leg swelling and PND.  Gastrointestinal: Negative for abdominal pain, blood in stool, constipation, diarrhea, heartburn, melena, nausea and vomiting.  Genitourinary: Negative for hematuria.  Musculoskeletal: Positive for falls, joint pain and myalgias.  Skin: Negative for rash.  Neurological: Positive for weakness. Negative for dizziness, tingling, tremors, sensory change, speech change, focal weakness and loss of consciousness.  Endo/Heme/Allergies: Does not bruise/bleed easily.  Psychiatric/Behavioral: Negative for substance abuse. The patient is not nervous/anxious.   All other systems reviewed and are negative.   Labs: No results for input(s): CKTOTAL, CKMB, TROPONINI in the last 72 hours. Lab Results  Component Value Date   WBC 10.6 01/31/2017   HGB 9.5 (L) 01/31/2017   HCT 29.0 (L) 01/31/2017   MCV 82.5 01/31/2017   PLT 253 01/31/2017     Recent Labs Lab 01/31/17 1147  NA 140  K 5.1  CL 102  CO2 27  BUN 53*  CREATININE 1.75*  CALCIUM 9.0  PROT  6.7  BILITOT 1.2  ALKPHOS 51  ALT 13*  AST 26  GLUCOSE 116*   Lab Results  Component Value Date   CHOL 202 (H) 12/23/2014   HDL 28 (L) 12/23/2014   LDLCALC 107 (H) 12/23/2014   TRIG 337  (H) 12/23/2014   No results found for: DDIMER  Radiology/Studies:  Ct Abdomen Pelvis Wo Contrast  Result Date: 01/29/2017 CLINICAL DATA:  83 year old female with history of acute renal failure. Hematuria. Anemia. EXAM: CT ABDOMEN AND PELVIS WITHOUT CONTRAST TECHNIQUE: Multidetector CT imaging of the abdomen and pelvis was performed following the standard protocol without IV contrast. COMPARISON:  CT the abdomen and pelvis 06/27/2013. FINDINGS: Lower chest: Mild cardiomegaly. Small amount of pericardial fluid and/or thickening. No pericardial calcification. Biventricular pacemaker leads. Aortic atherosclerosis. Small left and trace right pleural effusions. Passive subsegmental atelectasis in left lower lobe. Hepatobiliary: Tiny calcified granuloma in the left lobe of the liver. No definite cystic or solid hepatic lesions are identified on today's noncontrast CT examination. Status post cholecystectomy. Pancreas: No pancreatic mass or peripancreatic inflammatory changes are noted on today's noncontrast CT examination. Spleen: Multiple well-defined low-attenuation splenic lesions are again noted. These are very similar to numerous prior examinations dating back to 2011, with the largest of these lesions measuring up to 5.8 x 5.3 cm. These are incompletely characterized on today's noncontrast CT examination, but are presumably benign. Spleen remains enlarged measuring up to 10.6 x 9.0 x 15.8 cm (estimated splenic volume of 754 mL). Adrenals/Urinary Tract: No calculi are identified within the collecting system of either kidney, along the course of either ureter, or within the lumen of the urinary bladder. Atrophy of the kidneys bilaterally. Multiple lesions in both kidneys of varying degrees of complexity ranging from low to high attenuation, presumably a combination of small cysts and proteinaceous/hemorrhagic cysts, but incompletely characterized on today's noncontrast CT examination. No hydroureteronephrosis.  Urinary bladder is normal in appearance. Left adrenal gland is normal in appearance. 1.9 cm low-attenuation (9 Hounsfield unit) right adrenal nodule is similar to prior examinations, compatible with an adenoma. Stomach/Bowel: Unenhanced appearance of the stomach is normal. There is no pathologic dilatation of small bowel or colon. A few scattered colonic diverticulae are noted, without surrounding inflammatory changes to suggest an acute diverticulitis at this time. The appendix is not confidently identified and may be surgically absent. Regardless, there are no inflammatory changes noted adjacent to the cecum to suggest the presence of an acute appendicitis at this time. Vascular/Lymphatic: Aortic atherosclerosis, without definite aneurysm in the abdominal or pelvic vasculature. Right femoral all PermCath with tip terminating in the inferior vena cava shortly beneath the level of the renal veins. No lymphadenopathy noted in the abdomen or pelvis. Reproductive: Status posthysterectomy. Ovaries are not confidently identified may be surgically absent or atrophic. Other: No significant volume of ascites.  No pneumoperitoneum. Musculoskeletal: There are no aggressive appearing lytic or blastic lesions noted in the visualized portions of the skeleton. IMPRESSION: 1. No definite source for hematuria identified on today's study. Specifically, no urinary tract calculi no findings of urinary tract obstruction are noted at this time. 2. However, there are multiple renal lesions bilaterally of varying degrees of complexity. These are favored to represent multiple cysts and proteinaceous/hemorrhagic cysts, and generally appear similar to prior studies. These could be definitively characterized with MRI of the abdomen with and without IV gadolinium if hematuria persists or worsens. 3. Small left and trace right pleural effusions. 4. Small amount of pericardial fluid and/or thickening. 5.  Splenomegaly. 6. Atherosclerosis. 7.  Colonic diverticulosis without evidence of acute diverticulitis at this time. 8. Additional incidental findings, as above, similar prior studies. Electronically Signed   By: Vinnie Langton M.D.   On: 01/29/2017 14:44   Dg Chest 1 View  Result Date: 01/31/2017 CLINICAL DATA:  Fall with left hip pain EXAM: CHEST 1 VIEW COMPARISON:  01/23/2017 FINDINGS: Chronically enlarged cardiac silhouette. Pacemaker leads appear unchanged. Chronic aortic atherosclerosis. Less pleural fluid on the left with less left base atelectasis. Chronic post treatment scarring in the right upper lobe as seen previously. IMPRESSION: Radiographic improvement with less left pleural fluid and better aeration of the left lower lobe. Electronically Signed   By: Nelson Chimes M.D.   On: 01/31/2017 11:22   Dg Chest 2 View  Result Date: 01/23/2017 CLINICAL DATA:  Cough and chest congestion. History of asthma -COPD, coronary artery disease, lung malignancy, elevated white blood cell count currently. EXAM: CHEST  2 VIEW COMPARISON:  PA and lateral chest x-ray of Jan 12, 2017 FINDINGS: The right lung is well-expanded. A stable tiny pleural effusion is observed. There is stable density in the right apex radiating toward the right hilum. There are stable surgical clips here. On the left there is increased pleural fluid at the lung base. The cardiac silhouette is enlarged. The pulmonary vascularity is not clearly engorged. The ICD is in stable position. There is calcification in the wall of the aortic arch. The observed bony thorax exhibits no acute abnormality. IMPRESSION: Increased volume of pleural fluid on the left. Stable tiny right pleural effusion. Stable density in the right pulmonary apex. Stable cardiomegaly without pulmonary vascular congestion. No acute pneumonia is observed. Thoracic aortic atherosclerosis. Electronically Signed   By: David  Martinique M.D.   On: 01/23/2017 14:05   Dg Chest 2 View  Result Date: 01/12/2017 CLINICAL DATA:   Chest pain EXAM: CHEST  2 VIEW COMPARISON:  05-16-2017 FINDINGS: Persistent scarring is noted in the right upper lobe. Surgical clips are noted. Pacing device is again seen and stable. Cardiac shadow remains enlarged. Aortic calcifications are again noted. Chronic blunting of the costophrenic angles is seen stable from the prior exam. Some mild left basilar atelectasis is noted IMPRESSION: Stable small effusions. Mild left basilar atelectasis. Chronic changes in the right apex. Electronically Signed   By: Inez Catalina M.D.   On: 01/12/2017 09:03   Ct Chest Wo Contrast  Result Date: 01/24/2017 CLINICAL DATA:  Acute respiratory failure with hypoxia, shortness of breath, pneumonia. EXAM: CT CHEST WITHOUT CONTRAST TECHNIQUE: Multidetector CT imaging of the chest was performed following the standard protocol without IV contrast. COMPARISON:  03/07/2016 and 06/08/2015. FINDINGS: Cardiovascular: Atherosclerotic calcification of the arterial vasculature, including three-vessel involvement of the coronary arteries. Heart is enlarged. Moderate to large pericardial effusion, new. Mediastinum/Nodes: Mediastinal lymph nodes measure up to 10 mm in the low right paratracheal station. Hilar regions are difficult to evaluate without IV contrast. No axillary adenopathy. Esophagus is grossly unremarkable. Lungs/Pleura: Post treatment scarring and fiducial markers in the right upper lobe, stable. Scattered subpleural nodularity measures up to 4 mm, unchanged. There may be mild basilar septal thickening. Small right pleural effusion and moderate left pleural effusion with compressive volume loss in the left lower lobe. Mild mosaic pulmonary parenchymal attenuation, nonspecific. Airway is unremarkable. Upper Abdomen: Visualized portion of the liver is unremarkable. 2.7 cm fluid density nodule in the right adrenal gland, stable. Subcentimeter lesions in the kidneys are too small to characterize. Spleen is enlarged and  contains  somewhat ill-defined low-attenuation lesions measuring up to 5.9 cm, unchanged from prior exams and likely cysts. Periportal lymph nodes measure up to 12 mm. Musculoskeletal: No worrisome lytic or sclerotic lesions. Degenerative changes in the spine. Mild T8 compression, remote. IMPRESSION: 1. Moderate to large pericardial effusion, new. 2. Probable congestive heart failure. 3. Aortic atherosclerosis (ICD10-170.0). Three-vessel coronary artery calcification. 4. Right adrenal adenoma. 5. Splenomegaly. Electronically Signed   By: Lorin Picket M.D.   On: 01/24/2017 16:38   US Renal  Result Date: 01/25/2017 CLINICAL DATA:  Chronic renal failure with apparent acute exacerbation EXAM: RENAL / URINARY TRACT ULTRASOUND COMPLETE COMPARISON:  Jan 20, 2016 FINDINGS: Right Kidney: Length: 10.6 cm. Echogenicity is increased. There is renal cortical thinning. No perinephric fluid or hydronephrosis visualized. There is a cyst measuring 2.6 x 2.9 x 2.7 cm in the upper to mid portion of the right kidney. There is a 1.6 x 1.7 x 1.2 cm cyst in the right kidney lower pole region. There are subcentimeter cysts in the right kidney as well. No sonographically demonstrable calculus or ureterectasis. Left Kidney: Length: 11.9 cm. Echogenicity is increased. There is renal cortical thinning on the left. No perinephric fluid or hydronephrosis visualized. There is a cystic area in the upper pole region on the left measuring 2.5 x 2.2 x 2.0 cm which does contain internal echoes, similar to previous study. There is a smaller cyst more inferiorly on the left measuring 1.5 x 1.3 x 1.2 cm. Several subcentimeter cysts are noted on the left. No sonographically demonstrable calculus or ureterectasis. Bladder: Appears normal for degree of bladder distention. There is a left pleural effusion. There is a somewhat irregular anechoic focus in the spleen, stable from previous study. This lesion measures 4.8 x 5.0 cm. IMPRESSION: Kidneys show increased  echogenicity and renal cortical thinning consistent with medical renal disease. No obstructing focus in either kidney. There are cysts in each kidney. Note that there is a somewhat complex cyst in the left kidney, unchanged from prior study. Anechoic lesion in the spleen, benign in appearance. Etiology uncertain. Left pleural effusion noted. Electronically Signed   By: Lowella Grip III M.D.   On: 01/25/2017 13:35   Dg Hip Unilat With Pelvis 2-3 Views Left  Result Date: 01/31/2017 CLINICAL DATA:  [place] today.  Hip pain. EXAM: DG HIP (WITH OR WITHOUT PELVIS) 2-3V LEFT COMPARISON:  None. FINDINGS: Slightly impacted and angulated femoral neck fracture on the left. No intertrochanteric component. No pelvic component. IMPRESSION: Femoral neck fracture with slight impaction and angulation. Electronically Signed   By: Nelson Chimes M.D.   On: 01/31/2017 11:21    EKG: Interpreted by me showed: pending Telemetry: Interpreted by me showed: Paced, 70 bpm  Weights: Filed Weights   01/31/17 1041  Weight: 179 lb (81.2 kg)     Physical Exam: Blood pressure (!) 165/49, pulse 70, temperature 98.3 F (36.8 C), temperature source Oral, resp. rate 19, height 5\' 4"  (1.626 m), weight 179 lb (81.2 kg), SpO2 99 %. Body mass index is 30.73 kg/m. General: Well developed, well nourished, in no acute distress. Head: Normocephalic, atraumatic, sclera non-icteric, no xanthomas, nares are without discharge.  Neck: Negative for carotid bruits. JVD elevated ~ 6 cm. Lungs: Diminished breath sounds bilaterally. Breathing is unlabored. Heart: RRR with S1 S2. II/VI systolic murmur, no rubs, or gallops appreciated. Abdomen: Soft, non-tender, non-distended with normoactive bowel sounds. No hepatomegaly. No rebound/guarding. No obvious abdominal masses. Msk:  Strength and tone appear normal for age.  Extremities: No clubbing or cyanosis. No edema. Distal pedal pulses are 2+ and equal bilaterally. Neuro: Alert and oriented X 3.  No facial asymmetry. No focal deficit. Moves all extremities spontaneously. Psych:  Responds to questions appropriately with a normal affect.    Assessment and Plan:  Principal Problem:   S/p left hip fracture Active Problems:   Persistent atrial fibrillation (HCC)   Coronary artery disease involving native coronary artery of native heart without angina pectoris   Essential hypertension   Cancer of upper lobe of right lung (HCC)   Anemia associated with chronic renal failure   Diabetes mellitus, type 2 (HCC)   S/P coronary artery stent placement   Centrilobular emphysema (HCC)   S/P AV nodal ablation   Cardiac resynchronization therapy pacemaker (CRT-P) in place   GI bleed   Pre-operative cardiovascular examination    1. Preoperative cardiac evaluation: -Without symptoms concerning for angina -Shortness of breath at baseline -Patient has a complex history with recent admission the prior week for GI bleed with acute on chronic anemia requiring transfusion of one unit packed red blood cell, AKI requiring intermittent hemodialysis, demand ischemia, acute on chronic respiratory failure in the setting of CHF exacerbation and COPD exacerbation -She was just discharged on 6/5 -[facility] and tripped over a step leading into her living room landing on her left hip leading to a fracture -Patient would be high risk from a cardiology as well as general medicine standpoint -I do not suspect any amount of further cardiac testing would change this status or assist in her perioperative course -If orthopedics feels surgery is indicated and needed emergently, would proceed with surgery though will defer to orthopedics -No further cardiac testing at this time  2. CAD as above: -No symptoms concerning for angina -Previously on Eliquis and place of aspirin given her A. fib, though not currently on aspirin -Consider restarting aspirin -Continue beta  blocker  3. Atrial fibrillation status post AV nodal ablation status post BiB PPM: -Device appears to be functioning normally -Eliquis on hold in the setting of recent GI bleeding -Continue metoprolol  4. Chronic combined CHF: -Most recent echo from 01/25/2017 showed improved EF of 65-70% -Volume managed per hemodialysis transiently though now voiding -Continue metoprolol and Lasix -Would be cautious with perioperative fluids in an effort to prevent volume overload  5. Pericardial effusion: -Monitor as an outpatient  6. Anemia of chronic disease: -Transfuse as indicated per primary  7. CKD stage III-IV: -No longer on HD -Per primary  8. Hip fracture: -Per ortho  Signed, Christell Faith, PA-C [facility] Pager: [phone] 01/31/2017, 5:24 PM

## 2017-01-31 NOTE — Consult Note (Signed)
ORTHOPAEDIC CONSULTATION  REQUESTING PHYSICIAN: Nicholes Mango, MD  Chief Complaint: hip pain  HPI: Regina Burke is a 83 y.o. female who complains of  Left hip pain. Please see hospitalist note for details. Pain is worse with any movement but is well controlled at rest.  Past Medical History:  Diagnosis Date  . Anemia   . Arthritis   . Asthma   . Cardiac resynchronization therapy pacemaker (CRT-P) in place    a. 03/31/16:  Medtronic Percepta Quad CRT-P MRI SureScan (serial Number [id number]) device.  . Chronic diastolic CHF (congestive heart failure) ([place])    a. 10/2015 Echo: EF 55-65%, Gr1 DD, mild MR, mildly dil LA, nl RV fxn, nl PASP;  b. 03/2016 Echo: EF 30-35%, mod MR, mod dil LA; c. 12/2016 Echo: EF 60-65%, no rwma, mild AS, mildly dil LA, PASP 55mmHg.  . CKD (chronic kidney disease), stage IV ([place])   . COPD (chronic obstructive pulmonary disease) ([place])   . Coronary artery disease    a. 11/2014 NSTEMI/PCI: LM nl, LAD 59m, D1 30, LCX mild dzs, OM1 20p, OM2 76m, OM3 90p (2.25x8 Promus Premier DES), RCA nl.   . Cough    CHRONIC AT NIGHT  . Cushing's disease ([place])   . Depression   . Diverticulitis   . Edema    FEET/LEGS  . GERD (gastroesophageal reflux disease)   . Gout   . History of hiatal hernia   . History of pneumonia   . HLD (hyperlipidemia)   . HOH (hard of hearing)   . Hypertensive heart disease   . Lung cancer ([place]) dx'd 2014   S/P radiation 2015  . Migraine   . 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 03/2016 Echo: EF 30-35%;  b. 12/2016 Echo: EF 60-65%.  . Moderate mitral regurgitation   . Multiple allergies   . Myocardial infarction ([place])   . Oxygen deficiency    2LITERS  . Persistent atrial fibrillation ([place])    a. CHADS2VASc ==> 7 (CHF, HTN, age x 2, DM, vascular disease, and gender)--was on renal dosed eliquis but this was d/c'd in 12/2016 in setting of dark stools/anemia;  c. 03/2016 s/p AVN and MDT BiV ICD placement.  . Presence of  permanent cardiac pacemaker   . S/P AV nodal ablation    a. on 03/31/16 for persistant afib with CRT-P placement  . Sleep apnea   . Type II diabetes mellitus ([place])    Past Surgical History:  Procedure Laterality Date  . ABDOMINAL HYSTERECTOMY    . ABLATION     July 2017  . ADRENALECTOMY Left 1980's   "[place]"  . APPENDECTOMY    . BREAST CYST EXCISION Left   . CATARACT EXTRACTION W/PHACO Right 12/28/2015   Procedure: CATARACT EXTRACTION PHACO AND INTRAOCULAR LENS PLACEMENT (IOC);  Surgeon: Birder Robson, MD;  Location: [facility];  Service: Ophthalmology;  Laterality: Right;  [place] 48.4   . CATARACT EXTRACTION W/PHACO Left 11/14/2016   Procedure: CATARACT EXTRACTION PHACO AND INTRAOCULAR LENS PLACEMENT (IOC);  Surgeon: Birder Robson, MD;  Location: [facility];  Service: Ophthalmology;  Laterality: Left;  [place] 59.8 AP% 18.1 CDE 10.78 Fluid pack lot # [id number]  . CHOLECYSTECTOMY    . CORONARY ANGIOPLASTY WITH STENT PLACEMENT  12/23/2014  . ELECTROPHYSIOLOGIC STUDY N/A 03/08/2016   Procedure: CARDIOVERSION;  Surgeon: Wende Bushy, MD;  Location: [facility];  Service: Cardiovascular;  Laterality: N/A;  . ELECTROPHYSIOLOGIC STUDY N/A 03/07/2016   Procedure: Cardioversion;  Surgeon: Luanna Cole  Yvone Neu, MD;  Location: [facility];  Service: Cardiovascular;  Laterality: N/A;  . ELECTROPHYSIOLOGIC STUDY N/A 03/31/2016   Procedure: AV Node Ablation;  Surgeon: Will Meredith Leeds, MD;  Location: [facility];  Service: Cardiovascular;  Laterality: N/A;  . EP IMPLANTABLE DEVICE N/A 03/31/2016   Procedure: BiV Pacemaker Insertion CRT-P;  Surgeon: Will Meredith Leeds, MD;  Location: [facility];  Service: Cardiovascular;  Laterality: N/A;  . ESOPHAGOGASTRODUODENOSCOPY (EGD) WITH PROPOFOL N/A 01/26/2017   Procedure: ESOPHAGOGASTRODUODENOSCOPY (EGD) WITH PROPOFOL;  Surgeon: [name], MD;  Location: [facility];  Service: Endoscopy;  Laterality: N/A;  . FRACTURE SURGERY    . INSERT / REPLACE / REMOVE  PACEMAKER  02/2016  . LEFT HEART CATHETERIZATION WITH CORONARY ANGIOGRAM N/A 12/23/2014   Procedure: LEFT HEART CATHETERIZATION WITH CORONARY ANGIOGRAM;  Surgeon: Burnell Blanks, MD;  Location: [facility] CATH LAB;  Service: Cardiovascular;  Laterality: N/A;  . PERCUTANEOUS CORONARY STENT INTERVENTION (PCI-S)  12/23/2014   Procedure: PERCUTANEOUS CORONARY STENT INTERVENTION (PCI-S);  Surgeon: Burnell Blanks, MD;  Location: [facility] CATH LAB;  Service: Cardiovascular;;  Promus 2.25x8  . TONSILLECTOMY    . TRANSTHORACIC ECHOCARDIOGRAM  11/26/2015   Technically difficult study. EF 55-60%. Normal wall motion. GR 1 DD.  . TUBAL LIGATION    . WRIST FRACTURE SURGERY Bilateral ~ 2000   Social History   Social History  . Marital status: Widowed    Spouse name: N/A  . Number of children: N/A  . Years of education: N/A   Occupational History  . retired    Social History Main Topics  . Smoking status: Former Smoker    Packs/day: 1.00    Years: 45.00    Types: Cigarettes    Quit date: 04/25/1994  . Smokeless tobacco: Never Used  . Alcohol use No     Comment: 12/23/2014 "might have a couple mixed drinks/year"  . Drug use: No  . Sexual activity: No   Other Topics Concern  . Not on file   Social History Narrative   Lives locally with son.  Does not routinely exercise.   Family History  Problem Relation Age of Onset  . Heart disease Mother   . Diabetes Mother   . Osteoarthritis Mother   . Hypertension Mother   . Heart disease Father   . Hypertension Father   . COPD Brother    Allergies  Allergen Reactions  . Ciprofloxacin Shortness Of Breath, Itching and Rash  . Doxycycline Shortness Of Breath, Itching and Rash  . Penicillins Shortness Of Breath, Itching, Rash and Other (See Comments)    Has patient had a PCN reaction causing immediate rash, facial/tongue/throat swelling, SOB or lightheadedness with hypotension: Yes Has patient had a PCN reaction causing severe rash involving  mucus membranes or skin necrosis: No Has patient had a PCN reaction that required hospitalization No Has patient had a PCN reaction occurring within the last 10 years: No If all of the above answers are "NO", then may proceed with Cephalosporin use.  . Sulfa Antibiotics Shortness Of Breath, Itching and Rash  . Latex Itching  . Morphine And Related Itching  . Albuterol Sulfate   . Cefuroxime Rash    Blisters in mouth   Prior to Admission medications   Medication Sig Start Date End Date Taking? Authorizing Provider  ALPRAZolam Duanne Moron) 0.5 MG tablet Take 0.5 mg by mouth at bedtime. 12/27/16  Yes [provider]  atorvastatin (LIPITOR) 20 MG tablet Take 1 tablet (20 mg total)  by mouth every evening. 12/03/15  Yes Gollan, Kathlene November, MD  cholecalciferol (VITAMIN D) 400 UNITS TABS tablet Take 400 Units by mouth daily.    Yes [provider]  cyanocobalamin 500 MCG tablet Take 500 mcg by mouth daily.    Yes [provider]  fenofibrate (TRICOR) 48 MG tablet Take 48 mg by mouth every evening.    Yes [provider]  ferrous sulfate 325 (65 FE) MG EC tablet Take 325 mg by mouth daily with breakfast.    Yes [provider]  fluticasone furoate-vilanterol (BREO ELLIPTA) 100-25 MCG/INH AEPB Inhale 1 puff into the lungs daily as needed.    Yes [provider]  furosemide (LASIX) 40 MG tablet Take 1 tablet (40 mg total) by mouth daily. 04/01/16  Yes Eileen Stanford, PA-C  metoprolol succinate (TOPROL XL) 25 MG 24 hr tablet Take 0.5 tablets (12.5 mg total) by mouth daily. 12/19/16  Yes Minna Merritts, MD  Multiple Vitamins-Minerals (PRESERVISION AREDS 2) CAPS Take 1 capsule by mouth daily.   Yes [provider]  nitroGLYCERIN (NITROSTAT) 0.4 MG SL tablet Place 1 tablet (0.4 mg total) under the tongue every 5 (five) minutes as needed for chest pain. 12/24/14  Yes Barrett, Evelene Croon, PA-C  pantoprazole (PROTONIX) 40 MG tablet Take 40 mg by mouth  every evening.    Yes [provider]  sertraline (ZOLOFT) 50 MG tablet Take 1 tablet (50 mg total) by mouth daily. 03/24/16  Yes Gladstone Lighter, MD  tiotropium (SPIRIVA) 18 MCG inhalation capsule Place 18 mcg into inhaler and inhale daily as needed.     [provider]  traMADol (ULTRAM) 50 MG tablet Take 50 mg by mouth every 6 (six) hours as needed.    [provider]   Dg Chest 1 View  Result Date: 01/31/2017 CLINICAL DATA:  Fall with left hip pain EXAM: CHEST 1 VIEW COMPARISON:  01/23/2017 FINDINGS: Chronically enlarged cardiac silhouette. Pacemaker leads appear unchanged. Chronic aortic atherosclerosis. Less pleural fluid on the left with less left base atelectasis. Chronic post treatment scarring in the right upper lobe as seen previously. IMPRESSION: Radiographic improvement with less left pleural fluid and better aeration of the left lower lobe. Electronically Signed   By: Nelson Chimes M.D.   On: 01/31/2017 11:22   Dg Hip Unilat With Pelvis 2-3 Views Left  Result Date: 01/31/2017 CLINICAL DATA:  [place] today.  Hip pain. EXAM: DG HIP (WITH OR WITHOUT PELVIS) 2-3V LEFT COMPARISON:  None. FINDINGS: Slightly impacted and angulated femoral neck fracture on the left. No intertrochanteric component. No pelvic component. IMPRESSION: Femoral neck fracture with slight impaction and angulation. Electronically Signed   By: Nelson Chimes M.D.   On: 01/31/2017 11:21    Positive ROS: All other systems have been reviewed and were otherwise negative with the exception of those mentioned in the HPI and as above.  Physical Exam: General: Alert, no acute distress Cardiovascular: No pedal edema Respiratory: No cyanosis, no use of accessory musculature GI: No organomegaly, abdomen is soft and non-tender Skin: No lesions in the area of chief complaint Neurologic: Sensation intact distally Psychiatric: Patient is competent for consent with normal mood and affect Lymphatic: No  axillary or cervical lymphadenopathy  MUSCULOSKELETAL: RLE FROM, nontender BUE FROM, nontender Left lower extremity: Pain with IR/ER, +DF/PF/EHL, sensation light touch intact, good cap refill  Assessment: Impacted left femoral neck fracture  Plan: Risks, benefits and alternatives to treatment are discussed in detail. Patient and her  son would like to proceed with operative fixation in the hopes of early ambulation and returning to her baseline function. She is at high risk for bleeding, infection, DVT, PE, MI, stroke and death. They understand and are eager to proceed. All of their questions are answered. Plan a left hip closed reduction and percutaneous pinning.    Lovell Sheehan, MD    01/31/2017 5:26 PM

## 2017-01-31 NOTE — ED Triage Notes (Signed)
She arrives today via [facility] from home where she experienced a witnessed fall this am  EMS reports that the pt was walking up a step and her left leg just gave out on her - pt arrives reporting left hip pain

## 2017-01-31 NOTE — ED Provider Notes (Addendum)
[facility] Provider Note   CSN: [id number] Arrival date & time: 01/31/17  1034     History   Chief Complaint Chief Complaint  Patient presents with  . Fall    HPI Regina Burke is a 83 y.o. female hx of COPD, CAD, Recent GI bleed here presenting with fall. Patient was just discharged from the hospital yesterday. This morning, the visiting nurse came and see her and she opened the door and tripped over the threshold and fell backwards onto her left hip. Denies passing out or head injury. Patient was taken off blood thinners during the hospitalization. Patient was unable to bear weight after her fall today.      The history is provided by the patient.    Past Medical History:  Diagnosis Date  . Anemia   . Arthritis   . Asthma   . Cardiac resynchronization therapy pacemaker (CRT-P) in place    a. 03/31/16:  Medtronic Percepta Quad CRT-P MRI SureScan (serial Number [id number]) device.  . Chronic diastolic CHF (congestive heart failure) ([place])    a. 10/2015 Echo: EF 55-65%, Gr1 DD, mild MR, mildly dil LA, nl RV fxn, nl PASP;  b. 03/2016 Echo: EF 30-35%, mod MR, mod dil LA; c. 12/2016 Echo: EF 60-65%, no rwma, mild AS, mildly dil LA, PASP 38mmHg.  . CKD (chronic kidney disease), stage IV ([place])   . COPD (chronic obstructive pulmonary disease) ([place])   . Coronary artery disease    a. 11/2014 NSTEMI/PCI: LM nl, LAD 28m, D1 30, LCX mild dzs, OM1 20p, OM2 67m, OM3 90p (2.25x8 Promus Premier DES), RCA nl.   . Cough    CHRONIC AT NIGHT  . Cushing's disease ([place])   . Depression   . Diverticulitis   . Edema    FEET/LEGS  . GERD (gastroesophageal reflux disease)   . Gout   . History of hiatal hernia   . History of pneumonia   . HLD (hyperlipidemia)   . HOH (hard of hearing)   . Hypertensive heart disease   . Lung cancer ([place]) dx'd 2014   S/P radiation 2015  . Migraine   . Mixed Ischemic and Nonischemic Cardiomyopathy ([place])    a. 03/2016 Echo: EF 30-35%;  b. 12/2016 Echo:  EF 60-65%.  . Moderate mitral regurgitation   . Multiple allergies   . Myocardial infarction ([place])   . Oxygen deficiency    2LITERS  . Persistent atrial fibrillation ([place])    a. CHADS2VASc ==> 7 (CHF, HTN, age x 2, DM, vascular disease, and gender)--was on renal dosed eliquis but this was d/c'd in 12/2016 in setting of dark stools/anemia;  c. 03/2016 s/p AVN and MDT BiV ICD placement.  . Presence of permanent cardiac pacemaker   . S/P AV nodal ablation    a. on 03/31/16 for persistant afib with CRT-P placement  . Sleep apnea   . Type II diabetes mellitus [facility])     Patient Active Problem List   Diagnosis Date Noted  . Blood in stool   . Other diseases of stomach and duodenum   . CHF, acute on chronic ([place]) 01/23/2017  . Chest pain 01/12/2017  . Dizziness 08/25/2016  . LBBB (left bundle branch block) 04/01/2016  . S/P AV nodal ablation   . Cardiac resynchronization therapy pacemaker (CRT-P) in place   . Persistent atrial fibrillation ([place]): CHA2DS2-Vasc = ~7. On Xarelto 15 mg (age & renal Fxn) 03/18/2016  . Congestive dilated cardiomyopathy ([place]) 03/18/2016  Class: Temporary  . DNR (do not resuscitate) discussion   . Palliative care encounter   . Persistent atrial fibrillation ([place])   . Chronic diastolic CHF (congestive heart failure) ([place]) 02/16/2016  . HLD (hyperlipidemia) 02/16/2016  . Iron deficiency anemia due to chronic blood loss   . Essential hypertension   . Dyspnea 11/25/2015  . Diaphoresis   . Renal insufficiency   . Centrilobular emphysema ([place])   . Chronic obstructive pulmonary disease ([place]) 07/14/2015  . Status post thoracentesis   . S/P thoracentesis   . H/O: lung cancer   . Pleural effusion   . Coronary artery disease involving native coronary artery of native heart without angina pectoris   . S/P coronary artery stent placement   . Pressure ulcer 06/08/2015  . Dehydration 05/09/2015  . Diabetes ([place]) 05/09/2015  . Closed fracture nasal bone 05/09/2015  .  Cancer of upper lobe of right lung ([place]) 01/08/2015  . Allergic state 01/08/2015  . Anemia associated with chronic renal failure 01/08/2015  . Chronic kidney disease 01/08/2015  . CAFL (chronic airflow limitation) ([place]) 01/08/2015  . Arthritis, degenerative 01/08/2015  . 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 Diabetes mellitus, type 2 ([place]) 09/11/2014  . Type 2 diabetes mellitus ([place]) 09/11/2014    Past Surgical History:  Procedure Laterality Date  . ABDOMINAL HYSTERECTOMY    . ABLATION     July 2017  . ADRENALECTOMY Left 1980's   "[place]"  . APPENDECTOMY    . BREAST CYST EXCISION Left   . CATARACT EXTRACTION W/PHACO Right 12/28/2015   Procedure: CATARACT EXTRACTION PHACO AND INTRAOCULAR LENS PLACEMENT (IOC);  Surgeon: Birder Robson, MD;  Location: [facility] ORS;  Service: Ophthalmology;  Laterality: Right;  [place] 48.4   . CATARACT EXTRACTION W/PHACO Left 11/14/2016   Procedure: CATARACT EXTRACTION PHACO AND INTRAOCULAR LENS PLACEMENT (IOC);  Surgeon: Birder Robson, MD;  Location: [facility] ORS;  Service: Ophthalmology;  Laterality: Left;  [place] 59.8 AP% 18.1 CDE 10.78 Fluid pack lot # [id number]  . CHOLECYSTECTOMY    . CORONARY ANGIOPLASTY WITH STENT PLACEMENT  12/23/2014  . ELECTROPHYSIOLOGIC STUDY N/A 03/08/2016   Procedure: CARDIOVERSION;  Surgeon: Wende Bushy, MD;  Location: [facility] ORS;  Service: Cardiovascular;  Laterality: N/A;  . ELECTROPHYSIOLOGIC STUDY N/A 03/07/2016   Procedure: Cardioversion;  Surgeon: Wende Bushy, MD;  Location: [facility] ORS;  Service: Cardiovascular;  Laterality: N/A;  . ELECTROPHYSIOLOGIC STUDY N/A 03/31/2016   Procedure: AV Node Ablation;  Surgeon: Will Meredith Leeds, MD;  Location: [facility];  Service: Cardiovascular;  Laterality: N/A;  . EP IMPLANTABLE DEVICE N/A 03/31/2016   Procedure: BiV Pacemaker Insertion CRT-P;  Surgeon: Will Meredith Leeds, MD;  Location: [facility];  Service: Cardiovascular;  Laterality: N/A;  .  ESOPHAGOGASTRODUODENOSCOPY (EGD) WITH PROPOFOL N/A 01/26/2017   Procedure: ESOPHAGOGASTRODUODENOSCOPY (EGD) WITH PROPOFOL;  Surgeon: [name], MD;  Location: [facility] ENDOSCOPY;  Service: Endoscopy;  Laterality: N/A;  . FRACTURE SURGERY    . INSERT / REPLACE / REMOVE PACEMAKER  02/2016  . LEFT HEART CATHETERIZATION WITH CORONARY ANGIOGRAM N/A 12/23/2014   Procedure: LEFT HEART CATHETERIZATION WITH CORONARY ANGIOGRAM;  Surgeon: Burnell Blanks, MD;  Location: [facility] CATH LAB;  Service: Cardiovascular;  Laterality: N/A;  . PERCUTANEOUS CORONARY STENT INTERVENTION (PCI-S)  12/23/2014   Procedure: PERCUTANEOUS CORONARY STENT INTERVENTION (PCI-S);  Surgeon: Burnell Blanks, MD;  Location: [facility] CATH LAB;  Service: Cardiovascular;;  Promus 2.25x8  . TONSILLECTOMY    . TRANSTHORACIC ECHOCARDIOGRAM  11/26/2015   Technically difficult study.  EF 55-60%. Normal wall motion. GR 1 DD.  . TUBAL LIGATION    . WRIST FRACTURE SURGERY Bilateral ~ 2000    OB History    No data available       Home Medications    Prior to Admission medications   Medication Sig Start Date End Date Taking? Authorizing Provider  ALPRAZolam Duanne Moron) 0.5 MG tablet Take 0.5 mg by mouth at bedtime. 12/27/16  Yes [provider]  atorvastatin (LIPITOR) 20 MG tablet Take 1 tablet (20 mg total) by mouth every evening. 12/03/15  Yes Gollan, Kathlene November, MD  cholecalciferol (VITAMIN D) 400 UNITS TABS tablet Take 400 Units by mouth daily.    Yes [provider]  cyanocobalamin 500 MCG tablet Take 500 mcg by mouth daily.    Yes [provider]  fenofibrate (TRICOR) 48 MG tablet Take 48 mg by mouth every evening.    Yes [provider]  ferrous sulfate 325 (65 FE) MG EC tablet Take 325 mg by mouth daily with breakfast.    Yes [provider]  fluticasone furoate-vilanterol (BREO ELLIPTA) 100-25 MCG/INH AEPB Inhale 1 puff into the lungs daily as needed.    Yes [provider]  furosemide  (LASIX) 40 MG tablet Take 1 tablet (40 mg total) by mouth daily. 04/01/16  Yes Eileen Stanford, PA-C  metoprolol succinate (TOPROL XL) 25 MG 24 hr tablet Take 0.5 tablets (12.5 mg total) by mouth daily. 12/19/16  Yes Minna Merritts, MD  Multiple Vitamins-Minerals (PRESERVISION AREDS 2) CAPS Take 1 capsule by mouth daily.   Yes [provider]  nitroGLYCERIN (NITROSTAT) 0.4 MG SL tablet Place 1 tablet (0.4 mg total) under the tongue every 5 (five) minutes as needed for chest pain. 12/24/14  Yes Barrett, Evelene Croon, PA-C  pantoprazole (PROTONIX) 40 MG tablet Take 40 mg by mouth every evening.    Yes [provider]  sertraline (ZOLOFT) 50 MG tablet Take 1 tablet (50 mg total) by mouth daily. 03/24/16  Yes Gladstone Lighter, MD  tiotropium (SPIRIVA) 18 MCG inhalation capsule Place 18 mcg into inhaler and inhale daily as needed.     [provider]  traMADol (ULTRAM) 50 MG tablet Take 50 mg by mouth every 6 (six) hours as needed.    [provider]    Family History Family History  Problem Relation Age of Onset  . Heart disease Mother   . Diabetes Mother   . Osteoarthritis Mother   . Hypertension Mother   . Heart disease Father   . Hypertension Father   . COPD Brother     Social History Social History  Substance Use Topics  . Smoking status: Former Smoker    Packs/day: 1.00    Years: 45.00    Types: Cigarettes    Quit date: 04/25/1994  . Smokeless tobacco: Never Used  . Alcohol use No     Comment: 12/23/2014 "might have a couple mixed drinks/year"     Allergies   Ciprofloxacin; Doxycycline; Penicillins; Sulfa antibiotics; Latex; Morphine and related; Albuterol sulfate; and Cefuroxime   Review of Systems Review of Systems  Musculoskeletal:       L hip pain   All other systems reviewed and are negative.    Physical Exam Updated Vital Signs BP (!) 148/55 (BP Location: Left Arm)   Pulse 70   Temp 98.2 F (36.8 C) (Oral)   Resp 18   Ht  5\' 4"  (1.626 m)   Wt 81.2 kg (179  lb)   SpO2 96%   BMI 30.73 kg/m   Physical Exam  Constitutional: She is oriented to person, place, and time.  Uncomfortable   HENT:  Head: Normocephalic and atraumatic.  Mouth/Throat: Oropharynx is clear and moist.  Eyes: EOM are normal. Pupils are equal, round, and reactive to light.  Neck: Normal range of motion. Neck supple.  Cardiovascular: Normal rate, regular rhythm and normal heart sounds.   Pulmonary/Chest: Effort normal and breath sounds normal. No respiratory distress. She has no wheezes. She has no rales.  Abdominal: Soft. Bowel sounds are normal. She exhibits no distension. There is no tenderness. There is no guarding.  Musculoskeletal:  L hip shortened and rotated, unable to move L hip. No spinal tenderness, 2+ DP pulse   Neurological: She is alert and oriented to person, place, and time.  Skin: Skin is warm.  Psychiatric: She has a normal mood and affect.  Nursing note and vitals reviewed.    ED Treatments / Results  Labs (all labs ordered are listed, but only abnormal results are displayed) Labs Reviewed  CBC WITH DIFFERENTIAL/PLATELET - Abnormal; Notable for the following:       Result Value   RBC 3.52 (*)    Hemoglobin 9.5 (*)    HCT 29.0 (*)    RDW 17.6 (*)    Neutro Abs 9.5 (*)    Lymphs Abs 0.4 (*)    All other components within normal limits  COMPREHENSIVE METABOLIC PANEL - Abnormal; Notable for the following:    Glucose, Bld 116 (*)    BUN 53 (*)    Creatinine, Ser 1.75 (*)    Albumin 3.2 (*)    ALT 13 (*)    GFR calc non Af Amer 26 (*)    GFR calc Af Amer 30 (*)    All other components within normal limits  PROTIME-INR  URINALYSIS, ROUTINE W REFLEX MICROSCOPIC  TYPE AND SCREEN    EKG  EKG Interpretation None      ED ECG REPORT I, Wandra Arthurs, the attending physician, personally viewed and interpreted this ECG.   Date: 01/31/2017  EKG Time: 11:37 am  Rate: 71  Rhythm: unchanged from previous  tracings, paced   Axis: normal  Intervals:none  ST&T Change: nonspecific    Radiology Ct Abdomen Pelvis Wo Contrast  Result Date: 01/29/2017 CLINICAL DATA:  83 year old female with history of acute renal failure. Hematuria. Anemia. EXAM: CT ABDOMEN AND PELVIS WITHOUT CONTRAST TECHNIQUE: Multidetector CT imaging of the abdomen and pelvis was performed following the standard protocol without IV contrast. COMPARISON:  CT the abdomen and pelvis 06/27/2013. FINDINGS: Lower chest: Mild cardiomegaly. Small amount of pericardial fluid and/or thickening. No pericardial calcification. Biventricular pacemaker leads. Aortic atherosclerosis. Small left and trace right pleural effusions. Passive subsegmental atelectasis in left lower lobe. Hepatobiliary: Tiny calcified granuloma in the left lobe of the liver. No definite cystic or solid hepatic lesions are identified on today's noncontrast CT examination. Status post cholecystectomy. Pancreas: No pancreatic mass or peripancreatic inflammatory changes are noted on today's noncontrast CT examination. Spleen: Multiple well-defined low-attenuation splenic lesions are again noted. These are very similar to numerous prior examinations dating back to 2011, with the largest of these lesions measuring up to 5.8 x 5.3 cm. These are incompletely characterized on today's noncontrast CT examination, but are presumably benign. Spleen remains enlarged measuring up to 10.6 x 9.0 x 15.8 cm (estimated splenic volume of 754 mL). Adrenals/Urinary Tract: No calculi are identified within the collecting  system of either kidney, along the course of either ureter, or within the lumen of the urinary bladder. Atrophy of the kidneys bilaterally. Multiple lesions in both kidneys of varying degrees of complexity ranging from low to high attenuation, presumably a combination of small cysts and proteinaceous/hemorrhagic cysts, but incompletely characterized on today's noncontrast CT examination. No  hydroureteronephrosis. Urinary bladder is normal in appearance. Left adrenal gland is normal in appearance. 1.9 cm low-attenuation (9 Hounsfield unit) right adrenal nodule is similar to prior examinations, compatible with an adenoma. Stomach/Bowel: Unenhanced appearance of the stomach is normal. There is no pathologic dilatation of small bowel or colon. A few scattered colonic diverticulae are noted, without surrounding inflammatory changes to suggest an acute diverticulitis at this time. The appendix is not confidently identified and may be surgically absent. Regardless, there are no inflammatory changes noted adjacent to the cecum to suggest the presence of an acute appendicitis at this time. Vascular/Lymphatic: Aortic atherosclerosis, without definite aneurysm in the abdominal or pelvic vasculature. Right femoral all PermCath with tip terminating in the inferior vena cava shortly beneath the level of the renal veins. No lymphadenopathy noted in the abdomen or pelvis. Reproductive: Status posthysterectomy. Ovaries are not confidently identified may be surgically absent or atrophic. Other: No significant volume of ascites.  No pneumoperitoneum. Musculoskeletal: There are no aggressive appearing lytic or blastic lesions noted in the visualized portions of the skeleton. IMPRESSION: 1. No definite source for hematuria identified on today's study. Specifically, no urinary tract calculi no findings of urinary tract obstruction are noted at this time. 2. However, there are multiple renal lesions bilaterally of varying degrees of complexity. These are favored to represent multiple cysts and proteinaceous/hemorrhagic cysts, and generally appear similar to prior studies. These could be definitively characterized with MRI of the abdomen with and without IV gadolinium if hematuria persists or worsens. 3. Small left and trace right pleural effusions. 4. Small amount of pericardial fluid and/or thickening. 5. Splenomegaly. 6.  Atherosclerosis. 7. Colonic diverticulosis without evidence of acute diverticulitis at this time. 8. Additional incidental findings, as above, similar prior studies. Electronically Signed   By: Vinnie Langton M.D.   On: 01/29/2017 14:44   Dg Chest 1 View  Result Date: 01/31/2017 CLINICAL DATA:  Fall with left hip pain EXAM: CHEST 1 VIEW COMPARISON:  01/23/2017 FINDINGS: Chronically enlarged cardiac silhouette. Pacemaker leads appear unchanged. Chronic aortic atherosclerosis. Less pleural fluid on the left with less left base atelectasis. Chronic post treatment scarring in the right upper lobe as seen previously. IMPRESSION: Radiographic improvement with less left pleural fluid and better aeration of the left lower lobe. Electronically Signed   By: Nelson Chimes M.D.   On: 01/31/2017 11:22   Dg Hip Unilat With Pelvis 2-3 Views Left  Result Date: 01/31/2017 CLINICAL DATA:  [place] today.  Hip pain. EXAM: DG HIP (WITH OR WITHOUT PELVIS) 2-3V LEFT COMPARISON:  None. FINDINGS: Slightly impacted and angulated femoral neck fracture on the left. No intertrochanteric component. No pelvic component. IMPRESSION: Femoral neck fracture with slight impaction and angulation. Electronically Signed   By: Nelson Chimes M.D.   On: 01/31/2017 11:21    Procedures Procedures (including critical care time)  Angiocath insertion Performed by: Wandra Arthurs  Consent: Verbal consent obtained. Risks and benefits: risks, benefits and alternatives were discussed Time out: Immediately prior to procedure a "time out" was called to verify the correct patient, procedure, equipment, support staff and site/side marked as required.  Preparation: Patient was prepped and draped  in the usual sterile fashion.  Vein Location: R brachial  Ultrasound Guided  Gauge: 20 long   Normal blood return and flush without difficulty Patient tolerance: Patient tolerated the procedure well with no immediate complications.     Medications  Ordered in ED Medications  fentaNYL (SUBLIMAZE) injection 50 mcg (50 mcg Intravenous Given 01/31/17 1212)     Initial Impression / Assessment and Plan / ED Course  I have reviewed the triage vital signs and the nursing notes.  Pertinent labs & imaging results that were available during my care of the patient were reviewed by me and considered in my medical decision making (see chart for details).    Regina Burke is a 83 y.o. female here with fall. Recent GI bleed now off anticoagulation. Will get labs, xrays.   12:52 PM Xray showed L hip fracture. Called Dr. Harlow Mares who plans on doing hip surgery. Hospitalist to admit. Hg 9.5, improved since recent admission.    Final Clinical Impressions(s) / ED Diagnoses   Final diagnoses:  Hip fracture, left, closed, initial encounter [facility])    New Prescriptions New Prescriptions   No medications on file     Drenda Freeze, MD 01/31/17 1252    Drenda Freeze, MD 01/31/17 [phone]

## 2017-02-01 ENCOUNTER — Inpatient Hospital Stay: Payer: Medicare Other | Admitting: Anesthesiology

## 2017-02-01 ENCOUNTER — Encounter: Payer: Self-pay | Admitting: General Practice

## 2017-02-01 ENCOUNTER — Inpatient Hospital Stay: Payer: Medicare Other

## 2017-02-01 ENCOUNTER — Encounter
Admission: EM | Disposition: A | Discharge status: Skilled Nursing Facility | Payer: Self-pay | Source: Home / Self Care | Attending: Internal Medicine

## 2017-02-01 HISTORY — PX: HIP PINNING,CANNULATED: SHX1758

## 2017-02-01 LAB — COMPREHENSIVE METABOLIC PANEL
ALBUMIN: 3.1 g/dL — AB (ref 3.5–5.0)
ALK PHOS: 47 U/L (ref 38–126)
ALT: 12 U/L — AB (ref 14–54)
AST: 21 U/L (ref 15–41)
Anion gap: 10 (ref 5–15)
BUN: 50 mg/dL — ABNORMAL HIGH (ref 6–20)
CALCIUM: 8.5 mg/dL — AB (ref 8.9–10.3)
CO2: 24 mmol/L (ref 22–32)
CREATININE: 1.85 mg/dL — AB (ref 0.44–1.00)
Chloride: 103 mmol/L (ref 101–111)
GFR calc Af Amer: 28 mL/min — ABNORMAL LOW (ref >60)
GFR calc non Af Amer: 24 mL/min — ABNORMAL LOW (ref >60)
GLUCOSE: 106 mg/dL — AB (ref 65–99)
Potassium: 4.8 mmol/L (ref 3.5–5.1)
Sodium: 137 mmol/L (ref 135–145)
Total Bilirubin: 1.3 mg/dL — ABNORMAL HIGH (ref 0.3–1.2)
Total Protein: 6.6 g/dL (ref 6.5–8.1)

## 2017-02-01 LAB — GLUCOSE, CAPILLARY
GLUCOSE-CAPILLARY: 108 mg/dL — AB (ref 65–99)
GLUCOSE-CAPILLARY: 136 mg/dL — AB (ref 65–99)
GLUCOSE-CAPILLARY: 142 mg/dL — AB (ref 65–99)
Glucose-Capillary: 166 mg/dL — ABNORMAL HIGH (ref 65–99)

## 2017-02-01 LAB — CBC
HCT: 26.9 % — ABNORMAL LOW (ref 35.0–47.0)
HEMOGLOBIN: 8.9 g/dL — AB (ref 12.0–16.0)
MCH: 27.1 pg (ref 26.0–34.0)
MCHC: 32.9 g/dL (ref 32.0–36.0)
MCV: 82.3 fL (ref 80.0–100.0)
Platelets: 221 10*3/uL (ref 150–440)
RBC: 3.27 MIL/uL — AB (ref 3.80–5.20)
RDW: 17.8 % — ABNORMAL HIGH (ref 11.5–14.5)
WBC: 8.6 10*3/uL (ref 3.6–11.0)

## 2017-02-01 LAB — PROTIME-INR
INR: 1.43
Prothrombin Time: 17.6 seconds — ABNORMAL HIGH (ref 11.4–15.2)

## 2017-02-01 LAB — SURGICAL PCR SCREEN
MRSA, PCR: NEGATIVE
STAPHYLOCOCCUS AUREUS: NEGATIVE

## 2017-02-01 SURGERY — FIXATION, FEMUR, NECK, PERCUTANEOUS, USING SCREW
Anesthesia: General | Site: Hip | Laterality: Left | Wound class: Clean

## 2017-02-01 MED ORDER — FENTANYL CITRATE (PF) 100 MCG/2ML IJ SOLN
INTRAMUSCULAR | Status: DC | PRN
  Administered 2017-02-01: 50 ug via INTRAVENOUS
  Administered 2017-02-01: 25 ug via INTRAVENOUS

## 2017-02-01 MED ORDER — ACETAMINOPHEN 10 MG/ML IV SOLN
INTRAVENOUS | Status: AC
  Filled 2017-02-01: qty 100

## 2017-02-01 MED ORDER — ETOMIDATE 2 MG/ML IV SOLN
INTRAVENOUS | Status: DC | PRN
  Administered 2017-02-01: 16 mg via INTRAVENOUS

## 2017-02-01 MED ORDER — SUGAMMADEX SODIUM 200 MG/2ML IV SOLN
INTRAVENOUS | Status: DC | PRN
  Administered 2017-02-01: 162.4 mg via INTRAVENOUS

## 2017-02-01 MED ORDER — ONDANSETRON HCL 4 MG/2ML IJ SOLN: 4.0000 mg | Freq: Once | INTRAMUSCULAR | Status: DC | PRN

## 2017-02-01 MED ORDER — FENTANYL CITRATE (PF) 100 MCG/2ML IJ SOLN
INTRAMUSCULAR | Status: AC
  Filled 2017-02-01: qty 2

## 2017-02-01 MED ORDER — SODIUM CHLORIDE 0.9 % IR SOLN
Status: DC | PRN
  Administered 2017-02-01: 100 mL

## 2017-02-01 MED ORDER — BACITRACIN 50000 UNITS IM SOLR
INTRAMUSCULAR | Status: AC
  Filled 2017-02-01: qty 1

## 2017-02-01 MED ORDER — BUPIVACAINE-EPINEPHRINE (PF) 0.25% -1:200000 IJ SOLN
INTRAMUSCULAR | Status: AC
  Filled 2017-02-01: qty 30

## 2017-02-01 MED ORDER — PHENYLEPHRINE HCL 10 MG/ML IJ SOLN
INTRAMUSCULAR | Status: DC | PRN
  Administered 2017-02-01 (×3): 50 ug via INTRAVENOUS

## 2017-02-01 MED ORDER — SODIUM CHLORIDE FLUSH 0.9 % IV SOLN
INTRAVENOUS | Status: AC
  Filled 2017-02-01: qty 10

## 2017-02-01 MED ORDER — LACTATED RINGERS IV SOLN
INTRAVENOUS | Status: DC
  Administered 2017-02-01: 15:00:00 via INTRAVENOUS

## 2017-02-01 MED ORDER — VANCOMYCIN HCL IN DEXTROSE 1-5 GM/200ML-% IV SOLN
1000.0000 mg | Freq: Two times a day (BID) | INTRAVENOUS | Status: AC
  Administered 2017-02-01: 1000 mg via INTRAVENOUS
  Filled 2017-02-01: qty 200

## 2017-02-01 MED ORDER — PROPOFOL 500 MG/50ML IV EMUL
INTRAVENOUS | Status: AC
  Filled 2017-02-01: qty 50

## 2017-02-01 MED ORDER — FENTANYL CITRATE (PF) 100 MCG/2ML IJ SOLN
25.0000 ug | INTRAMUSCULAR | Status: DC | PRN
  Administered 2017-02-01 (×4): 25 ug via INTRAVENOUS

## 2017-02-01 MED ORDER — ONDANSETRON HCL 4 MG/2ML IJ SOLN
INTRAMUSCULAR | Status: DC | PRN
  Administered 2017-02-01: 4 mg via INTRAVENOUS

## 2017-02-01 MED ORDER — ENOXAPARIN SODIUM 30 MG/0.3ML ~~LOC~~ SOLN
30.0000 mg | SUBCUTANEOUS | Status: DC
  Administered 2017-02-02 – 2017-02-05 (×4): 30 mg via SUBCUTANEOUS
  Filled 2017-02-01 (×4): qty 0.3

## 2017-02-01 MED ORDER — TRAMADOL HCL 50 MG PO TABS
50.0000 mg | ORAL_TABLET | Freq: Four times a day (QID) | ORAL | Status: DC | PRN
  Administered 2017-02-01 – 2017-02-05 (×11): 100 mg via ORAL
  Filled 2017-02-01 (×3): qty 2
  Filled 2017-02-01 (×2): qty 1
  Filled 2017-02-01 (×9): qty 2

## 2017-02-01 MED ORDER — BUPIVACAINE-EPINEPHRINE 0.25% -1:200000 IJ SOLN
INTRAMUSCULAR | Status: DC | PRN
  Administered 2017-02-01: 10 mL

## 2017-02-01 MED ORDER — ROCURONIUM BROMIDE 100 MG/10ML IV SOLN
INTRAVENOUS | Status: DC | PRN
  Administered 2017-02-01: 50 mg via INTRAVENOUS

## 2017-02-01 MED ORDER — ACETAMINOPHEN 10 MG/ML IV SOLN
INTRAVENOUS | Status: DC | PRN
  Administered 2017-02-01: 1000 mg via INTRAVENOUS

## 2017-02-01 MED ORDER — LIDOCAINE HCL (CARDIAC) 20 MG/ML IV SOLN
INTRAVENOUS | Status: DC | PRN
  Administered 2017-02-01: 100 mg via INTRAVENOUS

## 2017-02-01 SURGICAL SUPPLY — 32 items
BLADE SURG SZ10 CARB STEEL (BLADE) ×3 IMPLANT
BNDG COHESIVE 6X5 TAN STRL LF (GAUZE/BANDAGES/DRESSINGS) ×6 IMPLANT
BRUSH SCRUB EZ  4% CHG (MISCELLANEOUS) ×4
BRUSH SCRUB EZ 4% CHG (MISCELLANEOUS) ×2 IMPLANT
CANISTER SUCT 1200ML W/VALVE (MISCELLANEOUS) ×3 IMPLANT
CATH TRAY 16F METER LATEX (MISCELLANEOUS) IMPLANT
CHLORAPREP W/TINT 26ML (MISCELLANEOUS) ×3 IMPLANT
ELECT REM PT RETURN 9FT ADLT (ELECTROSURGICAL) ×3
ELECTRODE REM PT RTRN 9FT ADLT (ELECTROSURGICAL) ×1 IMPLANT
GAUZE PETRO XEROFOAM 1X8 (MISCELLANEOUS) ×3 IMPLANT
GAUZE SPONGE 4X4 12PLY STRL (GAUZE/BANDAGES/DRESSINGS) ×3 IMPLANT
GAUZE XEROFORM 4X4 STRL (GAUZE/BANDAGES/DRESSINGS) ×3 IMPLANT
GLOVE BIOGEL M 7.0 STRL (GLOVE) ×6 IMPLANT
GLOVE BIOGEL M STER SZ 6 (GLOVE) ×6 IMPLANT
GLOVE INDICATOR 8.0 STRL GRN (GLOVE) ×3 IMPLANT
GLOVE SURG ORTHO 8.0 STRL STRW (GLOVE) ×6 IMPLANT
GOWN STRL REUS W/ TWL LRG LVL3 (GOWN DISPOSABLE) ×1 IMPLANT
GOWN STRL REUS W/ TWL XL LVL3 (GOWN DISPOSABLE) ×1 IMPLANT
GOWN STRL REUS W/TWL LRG LVL3 (GOWN DISPOSABLE) ×2
GOWN STRL REUS W/TWL XL LVL3 (GOWN DISPOSABLE) ×2
GUIDEWIRE THREADED 2.8 (WIRE) ×9 IMPLANT
HOLDER FOLEY CATH W/STRAP (MISCELLANEOUS) IMPLANT
KIT RM TURNOVER CYSTO AR (KITS) ×3 IMPLANT
MAT BLUE FLOOR 46X72 FLO (MISCELLANEOUS) ×3 IMPLANT
NS IRRIG 1000ML POUR BTL (IV SOLUTION) ×3 IMPLANT
PACK HIP COMPR (MISCELLANEOUS) ×3 IMPLANT
SCREW CANN 16 THRD/105 7.3 (Screw) ×3 IMPLANT
SCREW CANN 16 THRD/85 7.3 (Screw) ×6 IMPLANT
SCREW CANN 16 THRD/95 7.3 (Screw) ×6 IMPLANT
STAPLER SKIN PROX 35W (STAPLE) ×3 IMPLANT
SUT VIC AB 2-0 CT2 27 (SUTURE) ×3 IMPLANT
WATER STERILE IRR 1000ML POUR (IV SOLUTION) ×3 IMPLANT

## 2017-02-01 NOTE — Clinical Social Work Placement (Signed)
   CLINICAL SOCIAL WORK PLACEMENT  NOTE  Date:  02/01/2017  Patient Details  Name: Regina Burke MRN: [id number] Date of Birth: [birth year]  Clinical Social Work is seeking post-discharge placement for this patient at the [place] level of care (*CSW will initial, date and re-position this form in  chart as items are completed):  Yes   Patient/family provided with [facility] Work Department's list of facilities offering this level of care within the geographic area requested by the patient (or if unable, by the patient's family).  Yes   Patient/family informed of their freedom to choose among providers that offer the needed level of care, that participate in Medicare, Medicaid or managed care program needed by the patient, have an available bed and are willing to accept the patient.  Yes   Patient/family informed of [facility]'s ownership interest in  [facility] and [facility], as well as of the fact that they are under no obligation to receive care at these facilities.  PASRR submitted to EDS on       PASRR number received on       Existing PASRR number confirmed on 02/01/17     FL2 transmitted to all facilities in geographic area requested by pt/family on 02/01/17     FL2 transmitted to all facilities within larger geographic area on       Patient informed that his/her managed care company has contracts with or will negotiate with certain facilities, including the following:            Patient/family informed of bed offers received.  Patient chooses bed at       Physician recommends and patient chooses bed at      Patient to be transferred to   on  .  Patient to be transferred to facility by       Patient family notified on   of transfer.  Name of family member notified:        PHYSICIAN       Additional Comment:    _______________________________________________ Pragya Lofaso, Veronia Beets, LCSW 02/01/2017, 2:43 PM

## 2017-02-01 NOTE — Progress Notes (Signed)
Clinical Education officer, museum (CSW) contacted patient's son [name] and presented bed offers. [facility] chose [facility]. [facility] liaison is aware of accepted bed offer.   [employer], LCSW [phone]

## 2017-02-01 NOTE — Discharge Summary (Signed)
Regina Burke NAME: Regina Burke    MR#:  [id number]  DATE OF BIRTH:  [birth year]  DATE OF ADMISSION:  01/23/2017 ADMITTING PHYSICIAN: Danella Penton, MD  DATE OF DISCHARGE: 01/30/2017  3:23 PM  PRIMARY CARE PHYSICIAN: Tracie Harrier, MD   ADMISSION DIAGNOSIS:  Hyperkalemia [E87.5] SOB (shortness of breath) [R06.02] Acute on chronic renal insufficiency [N28.9, N18.9] Chest pain, unspecified type [R07.9]  DISCHARGE DIAGNOSIS:  Active Problems:   CHF, acute on chronic (HCC)   GI bleed   Other diseases of stomach and duodenum   SECONDARY DIAGNOSIS:   Past Medical History:  Diagnosis Date  . Anemia   . Arthritis   . Asthma   . Cardiac resynchronization therapy pacemaker (CRT-P) in place    a. 03/31/16:  Medtronic Percepta Quad CRT-P MRI SureScan (serial Number [id number]) device.  . Chronic diastolic CHF (congestive heart failure) ([place])    a. 10/2015 Echo: EF 55-65%, Gr1 DD, mild MR, mildly dil LA, nl RV fxn, nl PASP;  b. 03/2016 Echo: EF 30-35%, mod MR, mod dil LA; c. 12/2016 Echo: EF 60-65%, no rwma, mild AS, mildly dil LA, PASP 79mmHg.  . CKD (chronic kidney disease), stage IV ([place])   . COPD (chronic obstructive pulmonary disease) ([place])   . Coronary artery disease    a. 11/2014 NSTEMI/PCI: LM nl, LAD 67m, D1 30, LCX mild dzs, OM1 20p, OM2 39m, OM3 90p (2.25x8 Promus Premier DES), RCA nl.   . Cough    CHRONIC AT NIGHT  . Cushing's disease ([place])   . Depression   . Diverticulitis   . Edema    FEET/LEGS  . GERD (gastroesophageal reflux disease)   . Gout   . History of hiatal hernia   . History of pneumonia   . HLD (hyperlipidemia)   . HOH (hard of hearing)   . Hypertensive heart disease   . Lung cancer ([place]) dx'd 2014   S/P radiation 2015  . Migraine   . Mixed Ischemic and Nonischemic Cardiomyopathy ([place])    a. 03/2016 Echo: EF 30-35%;  b. 12/2016 Echo: EF 60-65%.  . Moderate mitral regurgitation   . Multiple allergies    . Myocardial infarction ([place])   . Oxygen deficiency    2LITERS  . Persistent atrial fibrillation ([place])    a. CHADS2VASc ==> 7 (CHF, HTN, age x 2, DM, vascular disease, and gender)--was on renal dosed eliquis but this was d/c'd in 12/2016 in setting of dark stools/anemia;  c. 03/2016 s/p AVN and MDT BiV ICD placement.  . Presence of permanent cardiac pacemaker   . S/P AV nodal ablation    a. on 03/31/16 for persistant afib with CRT-P placement  . Sleep apnea   . Type II diabetes mellitus ([place])      ADMITTING HISTORY  HISTORY OF PRESENT ILLNESS:  Regina Burke  is a 83 y.o. female with a known history of Chronic diastolic congestive heart failure, chronic COPD on 2 L nasal cannula oxygen, history of A. fib on Eliquis that was discontinued about 10 days ago by Dr. Candis Musa, chronic kidney disease stage IV Comes to the emergency room with increasing shortness of breath, fatigability, orthopnea. Patient reports She gets tired very easily walking around the house getting more shortness of breath than her usual and has some nausea. Her by mouth intake has been poor.  In the ER she was found to white count of 16,000. No fever. Creatinine up to 3.59 (baseline  around 2) Chest x-ray shows increased left base pleural effusion. Patient is being admitted for acute on chronic hypoxic respiratory failure secondary to bilateral pleural effusion more on the left than right, acute on chronic diastolic congestive heart failure and acute on chronic CKD stage IV   HOSPITAL COURSE:   * Acute on chronic respiratory failure with hypoxia - resolved   Multifactorial,  CHF and COPD.   The white count and pro-calcitonin were high. But afebrile   UA is negative, CT chest is not showing pneumonia, stopped ABx. Patient back on 2 L oxygen by day of discharge which is her baseline  * Acute on Chronic diastolic CHF.   Improving with HD.  * AKI over CKD stage III. Worsening and with uremic symptoms and CHF patient  started on hemodialysis on 01/26/2017.   Patient had a femoral dialysis catheter placed. She had 2 dialysis sessions in the hospital. On the day of discharge it discussed with Dr. Holley Raring who suggested removing the dialysis catheter. She will be discharged home to follow-up with nephrology as outpatient.  * Hyperkalemia   Resolved  * Melena   Hemoglobin appears stable  Anti-coagulation on hold, continue monitoring.   Appreciated GI consult Endoscopy - No bleeding No further melena.  * Elevated troponin due to demand ischemia. Not MI   Seen by Dr. Fletcher Anon, continue beta blocker and statins.  * COPD   No acute wheezing.   Continue home inhalers.   * Anemia of chronic disease   Transfused 1  Unit PRBC on 5/31   EGD showed no bleeding. Mild gastropathy Hemoglobin remained stable  Workup with physical ended. Home health PT, nurse set up.  CONSULTS OBTAINED:  Treatment Team:  Wellington [place], MD End, Harrell Gave, MD Murlean Iba, MD  DRUG ALLERGIES:   Allergies  Allergen Reactions  . Ciprofloxacin Shortness Of Breath, Itching and Rash  . Doxycycline Shortness Of Breath, Itching and Rash  . Penicillins Shortness Of Breath, Itching, Rash and Other (See Comments)    Has patient had a PCN reaction causing immediate rash, facial/tongue/throat swelling, SOB or lightheadedness with hypotension: Yes Has patient had a PCN reaction causing severe rash involving mucus membranes or skin necrosis: No Has patient had a PCN reaction that required hospitalization No Has patient had a PCN reaction occurring within the last 10 years: No If all of the above answers are "NO", then may proceed with Cephalosporin use.  . Sulfa Antibiotics Shortness Of Breath, Itching and Rash  . Latex Itching  . Morphine And Related Itching  . Albuterol Sulfate   . Cefuroxime Rash    Blisters in mouth    DISCHARGE MEDICATIONS:   Discharge Medication List as of 01/30/2017  2:36 PM    CONTINUE these  medications which have NOT CHANGED   Details  ALPRAZolam (XANAX) 0.5 MG tablet Take 0.5 mg by mouth at bedtime., Starting Wed 12/27/2016, Historical Med    atorvastatin (LIPITOR) 20 MG tablet Take 1 tablet (20 mg total) by mouth every evening., Starting Fri 12/03/2015, Normal    cholecalciferol (VITAMIN D) 400 UNITS TABS tablet Take 400 Units by mouth daily. , Historical Med    cyanocobalamin 500 MCG tablet Take 500 mcg by mouth daily. , Historical Med    fenofibrate (TRICOR) 48 MG tablet Take 48 mg by mouth every evening. , Historical Med    ferrous sulfate 325 (65 FE) MG EC tablet Take 325 mg by mouth daily with breakfast. , Historical Med  fluticasone furoate-vilanterol (BREO ELLIPTA) 100-25 MCG/INH AEPB Inhale 1 puff into the lungs daily as needed. , Historical Med    furosemide (LASIX) 40 MG tablet Take 1 tablet (40 mg total) by mouth daily., Starting Sat 04/01/2016, Normal    metoprolol succinate (TOPROL XL) 25 MG 24 hr tablet Take 0.5 tablets (12.5 mg total) by mouth daily., Starting Tue 12/19/2016, Historical Med    Multiple Vitamins-Minerals (PRESERVISION AREDS 2) CAPS Take 1 capsule by mouth daily., Historical Med    nitroGLYCERIN (NITROSTAT) 0.4 MG SL tablet Place 1 tablet (0.4 mg total) under the tongue every 5 (five) minutes as needed for chest pain., Starting Thu 12/24/2014, Normal    pantoprazole (PROTONIX) 40 MG tablet Take 40 mg by mouth every evening. , Historical Med    sertraline (ZOLOFT) 50 MG tablet Take 1 tablet (50 mg total) by mouth daily., Starting Fri 03/24/2016, Normal    tiotropium (SPIRIVA) 18 MCG inhalation capsule Place 18 mcg into inhaler and inhale daily as needed. , Historical Med    traMADol (ULTRAM) 50 MG tablet Take 50 mg by mouth every 6 (six) hours as needed., Historical Med        Today   VITAL SIGNS:  Blood pressure (!) 166/59, pulse 70, temperature 97.8 F (36.6 C), temperature source Oral, resp. rate 18, height 5\' 4"  (1.626 m), weight 82 kg  (180 lb 12.8 oz), SpO2 98 %.  I/O:  No intake or output data in the 24 hours ending 02/01/17 1340  PHYSICAL EXAMINATION:  Physical Exam  GENERAL:  83 y.o.-year-old patient lying in the bed with no acute distress.  LUNGS: Normal breath sounds bilaterally, no wheezing, rales,rhonchi or crepitation. No use of accessory muscles of respiration.  CARDIOVASCULAR: S1, S2 normal. No murmurs, rubs, or gallops.  ABDOMEN: Soft, non-tender, non-distended. Bowel sounds present. No organomegaly or mass.  NEUROLOGIC: Moves all 4 extremities. PSYCHIATRIC: The patient is alert and oriented x 3.  SKIN: No obvious rash, lesion, or ulcer.   DATA REVIEW:   CBC  Recent Labs Lab 02/01/17 0522  WBC 8.6  HGB 8.9*  HCT 26.9*  PLT 221    Chemistries   Recent Labs Lab 02/01/17 0522  NA 137  K 4.8  CL 103  CO2 24  GLUCOSE 106*  BUN 50*  CREATININE 1.85*  CALCIUM 8.5*  AST 21  ALT 12*  ALKPHOS 47  BILITOT 1.3*    Cardiac Enzymes No results for input(s): TROPONINI in the last 168 hours.  Microbiology Results  Results for orders placed or performed during the hospital encounter of 01/23/17  Culture, blood (Routine x 2)     Status: None   Collection Time: 01/23/17 11:21 AM  Result Value Ref Range Status   Specimen Description BLOOD RIGHT HAND  Final   Special Requests   Final    BOTTLES DRAWN AEROBIC ONLY BCHV A ANAEROBIC WAS SENT BUT WAS NOT LABEL   Culture NO GROWTH 5 DAYS  Final   Report Status 01/28/2017 FINAL  Final  Culture, blood (Routine x 2)     Status: None   Collection Time: 01/23/17 11:26 AM  Result Value Ref Range Status   Specimen Description BLOOD RIGHT WRIST  Final   Special Requests BOTTLES DRAWN AEROBIC AND ANAEROBIC BCLV  Final   Culture NO GROWTH 5 DAYS  Final   Report Status 01/28/2017 FINAL  Final  Urine culture     Status: None   Collection Time: 01/24/17  3:24 PM  Result Value  Ref Range Status   Specimen Description URINE, RANDOM  Final   Special Requests  NONE  Final   Culture   Final    NO GROWTH Performed at [facility], [address], [place], [place] [address]    Report Status 01/26/2017 FINAL  Final  MRSA PCR Screening     Status: None   Collection Time: 01/27/17  9:58 AM  Result Value Ref Range Status   MRSA by PCR NEGATIVE NEGATIVE Final    Comment:        The GeneXpert MRSA Assay (FDA approved for NASAL specimens only), is one component of a comprehensive MRSA colonization surveillance program. It is not intended to diagnose MRSA infection nor to guide or monitor treatment for MRSA infections.     RADIOLOGY:  Dg Chest 1 View  Result Date: 01/31/2017 CLINICAL DATA:  Fall with left hip pain EXAM: CHEST 1 VIEW COMPARISON:  01/23/2017 FINDINGS: Chronically enlarged cardiac silhouette. Pacemaker leads appear unchanged. Chronic aortic atherosclerosis. Less pleural fluid on the left with less left base atelectasis. Chronic post treatment scarring in the right upper lobe as seen previously. IMPRESSION: Radiographic improvement with less left pleural fluid and better aeration of the left lower lobe. Electronically Signed   By: Nelson Chimes M.D.   On: 01/31/2017 11:22   Dg Hip Operative Unilat W Or W/o Pelvis Left  Result Date: 02/01/2017 CLINICAL DATA:  Intraoperative fluoro spot images from ORIF for femoral neck fracture. Fluoro time reported is 47 seconds. EXAM: OPERATIVE left HIP (WITH PELVIS IF PERFORMED) 2 VIEWS TECHNIQUE: Fluoroscopic spot image(s) were submitted for interpretation post-operatively. COMPARISON:  Preop study January 31, 2017 FINDINGS: The patient has undergone placement of 3 cortical screws for a subcapital fracture of the left hip. The screws are in reasonable position radiographically. IMPRESSION: Two fluoro spot images revealing the patient to of undergone ORIF for a subcapital fracture of the left hip without immediate postprocedure complication. Electronically Signed   By: David  Martinique M.D.   On: 02/01/2017  12:16   Dg Hip Unilat With Pelvis 2-3 Views Left  Result Date: 01/31/2017 CLINICAL DATA:  [place] today.  Hip pain. EXAM: DG HIP (WITH OR WITHOUT PELVIS) 2-3V LEFT COMPARISON:  None. FINDINGS: Slightly impacted and angulated femoral neck fracture on the left. No intertrochanteric component. No pelvic component. IMPRESSION: Femoral neck fracture with slight impaction and angulation. Electronically Signed   By: Nelson Chimes M.D.   On: 01/31/2017 11:21    Follow up with PCP in 1 week.  Management plans discussed with the patient, family and they are in agreement.  CODE STATUS:  Code Status History    Date Active Date Inactive Code Status Order ID Comments User Context   01/23/2017  4:48 PM 01/30/2017  6:31 PM DNR [id number]  Fritzi Mandes, MD ED   01/12/2017 11:14 AM 01/13/2017  6:36 PM Full Code [id number]  Epifanio Lesches, MD ED   03/30/2016 11:52 AM 04/03/2016  4:11 PM DNR [id number]  Erma Heritage, [place] Inpatient   03/16/2016  4:10 PM 03/24/2016  7:06 PM DNR [id number]  Awilda Bill, NP ED   03/08/2016  3:43 PM 03/14/2016  7:40 PM Partial Code [id number]  Knox Royalty, NP Inpatient   03/04/2016 10:28 PM 03/08/2016  3:43 PM Full Code [id number]  Holley Raring, NP ED   01/19/2016  7:56 PM 01/27/2016  6:01 PM Partial Code [id number]  Vaughan Basta, MD ED   11/26/2015  4:02 AM 11/26/2015  8:59 PM Full Code [id number]  Silver Huguenin, RN Inpatient   06/14/2015  6:57 PM 06/15/2015  8:54 PM DNR [id number]  Colleen Can, MD Inpatient   06/07/2015 10:20 PM 06/14/2015  6:56 PM Full Code [id number]  Hower, Aaron Mose, MD ED   05/15/2015  3:27 PM 05/24/2015  5:04 PM Full Code [id number]  Gladstone Lighter, MD ED   05/08/2015 11:23 AM 05/11/2015  3:13 PM Full Code [id number]  Dustin Flock, MD ED   04/15/2015 12:01 AM 04/15/2015  7:03 PM Full Code [id number]  Lance Coon, MD Inpatient   12/23/2014  1:09 PM 12/24/2014  5:17 PM Full Code [id number]  Burnell Blanks, MD Inpatient   12/23/2014  4:22 AM  12/23/2014  1:09 PM Full Code [id number]  Alwyn Pea, MD ED    Advance Directive Documentation     Most Recent Value  Type of Advance Directive  Out of facility DNR (pink MOST or yellow form)  Pre-existing out of facility DNR order (yellow form or pink MOST form)  -  "MOST" Form in Place?  -      TOTAL TIME TAKING CARE OF THIS PATIENT ON DAY OF DISCHARGE: more than 30 minutes.   Hillary Bow R M.D on 02/01/2017 at 1:40 PM  Between 7am to 6pm - Pager - [phone]  After 6pm go to [email or web address] - password [email or web address] [facility]  Office  [phone]  CC: Primary care physician; Tracie Harrier, MD  Note: This dictation was prepared with Dragon dictation along with smaller phrase technology. Any transcriptional errors that result from this process are unintentional.

## 2017-02-01 NOTE — NC FL2 (Signed)
[place] LEVEL OF CARE SCREENING TOOL     IDENTIFICATION  Patient Name: Regina Burke Birthdate: September 19, 1932 Sex: female Admission Date (Current Location): 01/31/2017  [place] and [place] Number:  Engineering geologist and Address:  [facility], [address] [place],  [address]      Provider Number: [id number]  Attending Physician Name and Address:  Gladstone Lighter, MD  Relative Name and Phone Number:       Current Level of Care: Hospital Recommended Level of Care: [place] Prior Approval Number:    Date Approved/Denied:   PASRR Number:  ([id number] )  Discharge Plan: SNF    Current Diagnoses: Patient Active Problem List   Diagnosis Date Noted  . S/p left hip fracture 01/31/2017  . Pre-operative cardiovascular examination 01/31/2017  . GI bleed   . Other diseases of stomach and duodenum   . CHF, acute on chronic ([place]) 01/23/2017  . Chest pain 01/12/2017  . Dizziness 08/25/2016  . LBBB (left bundle branch block) 04/01/2016  . S/P AV nodal ablation   . Cardiac resynchronization therapy pacemaker (CRT-P) in place   . Persistent atrial fibrillation ([place]): CHA2DS2-Vasc = ~7. On Xarelto 15 mg (age & renal Fxn) 03/18/2016  . Congestive dilated cardiomyopathy ([place]) 03/18/2016    Class: Temporary  . DNR (do not resuscitate) discussion   . Palliative care encounter   . Persistent atrial fibrillation ([place])   . Chronic diastolic CHF (congestive heart failure) ([place]) 02/16/2016  . HLD (hyperlipidemia) 02/16/2016  . Iron deficiency anemia due to chronic blood loss   . Essential hypertension   . Dyspnea 11/25/2015  . Diaphoresis   . Renal insufficiency   . Centrilobular emphysema ([place])   . Chronic obstructive pulmonary disease ([place]) 07/14/2015  . Status post thoracentesis   . S/P thoracentesis   . H/O: lung cancer   . Pleural effusion   . Coronary artery disease involving native coronary artery of  native heart without angina pectoris   . S/P coronary artery stent placement   . Pressure ulcer 06/08/2015  . Dehydration 05/09/2015  . Diabetes ([place]) 05/09/2015  . Closed fracture nasal bone 05/09/2015  . Cancer of upper lobe of right lung ([place]) 01/08/2015  . Allergic state 01/08/2015  . Anemia associated with chronic renal failure 01/08/2015  . Chronic kidney disease 01/08/2015  . CAFL (chronic airflow limitation) ([place]) 01/08/2015  . Arthritis, degenerative 01/08/2015  . 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 Diabetes mellitus, type 2 ([place]) 09/11/2014  . Type 2 diabetes mellitus ([place]) 09/11/2014    Orientation RESPIRATION BLADDER Height & Weight     Self, Time, Situation, Place  O2 (2 Liters Oxygen ) Continent Weight: 179 lb (81.2 kg) Height:  5\' 4"  (162.6 cm)  BEHAVIORAL SYMPTOMS/MOOD NEUROLOGICAL BOWEL NUTRITION STATUS   (none)  (none) Continent Diet (Diet: Clear Liquid to be advanced. )  AMBULATORY STATUS COMMUNICATION OF NEEDS Skin   Extensive Assist Verbally Surgical wounds (Incision: Left Leg. )                       Personal Care Assistance Level of Assistance  Bathing, Feeding, Dressing Bathing Assistance: Limited assistance Feeding assistance: Independent Dressing Assistance: Limited assistance     Functional Limitations Info  Sight, Hearing, Speech Sight Info: Adequate Hearing Info: Adequate Speech Info: Adequate    SPECIAL CARE FACTORS FREQUENCY  PT (By licensed PT), OT (By licensed OT)     PT Frequency:  (5)  OT Frequency:  (5)            Contractures      Additional Factors Info  Code Status, Allergies, Insulin Sliding Scale Code Status Info:  (Full Code. ) Allergies Info:  (Ciprofloxacin, Doxycycline, Penicillins, Sulfa Antibiotics, Latex, Morphine And Related, Albuterol Sulfate, Cefuroxime)   Insulin Sliding Scale Info:  (NovoLog Insulin Injections. )       Current Medications (02/01/2017):  This is the current hospital active  medication list Current Facility-Administered Medications  Medication Dose Route Frequency Provider Last Rate Last Dose  . 0.9 %  sodium chloride infusion   Intravenous Continuous Gouru, Aruna, MD 100 mL/hr at 02/01/17 0002    . acetaminophen (TYLENOL) tablet 650 mg  650 mg Oral Q6H PRN Gouru, Aruna, MD       Or  . acetaminophen (TYLENOL) suppository 650 mg  650 mg Rectal Q6H PRN Gouru, Aruna, MD      . albuterol (PROVENTIL) (2.5 MG/3ML) 0.083% nebulizer solution 2.5 mg  2.5 mg Nebulization Q4H PRN Gouru, Aruna, MD      . ALPRAZolam Duanne Moron) tablet 0.5 mg  0.5 mg Oral QHS Gouru, Aruna, MD   0.5 mg at 01/31/17 2200  . atorvastatin (LIPITOR) tablet 20 mg  20 mg Oral QPM Gouru, Aruna, MD      . cholecalciferol (VITAMIN D) tablet 400 Units  400 Units Oral Daily Gouru, Aruna, MD      . Derrill Memo ON 02/02/2017] enoxaparin (LOVENOX) injection 30 mg  30 mg Subcutaneous Q24H Lovell Sheehan, MD      . fenofibrate tablet 54 mg  54 mg Oral Daily Gouru, Aruna, MD      . fentaNYL (SUBLIMAZE) 100 MCG/2ML injection           . fentaNYL (SUBLIMAZE) injection 25 mcg  25 mcg Intravenous Q4H PRN Nicholes Mango, MD   25 mcg at 02/01/17 0813  . ferrous sulfate tablet 325 mg  325 mg Oral Q breakfast Gouru, Aruna, MD      . fluticasone furoate-vilanterol (BREO ELLIPTA) 100-25 MCG/INH 1 puff  1 puff Inhalation Daily Gouru, Aruna, MD      . furosemide (LASIX) tablet 40 mg  40 mg Oral Daily Gouru, Aruna, MD      . insulin aspart (novoLOG) injection 0-5 Units  0-5 Units Subcutaneous QHS Gouru, Aruna, MD      . insulin aspart (novoLOG) injection 0-9 Units  0-9 Units Subcutaneous TID WC Gouru, Aruna, MD      . lactated ringers infusion   Intravenous Continuous Lovell Sheehan, MD      . metoprolol succinate (TOPROL-XL) 24 hr tablet 12.5 mg  12.5 mg Oral Daily Gouru, Aruna, MD   12.5 mg at 02/01/17 0814  . multivitamin-lutein (OCUVITE-LUTEIN) capsule 2 capsule  2 capsule Oral Daily Gouru, Aruna, MD      . nitroGLYCERIN (NITROSTAT)  SL tablet 0.4 mg  0.4 mg Sublingual Q5 min PRN Gouru, Aruna, MD      . ondansetron (ZOFRAN) tablet 4 mg  4 mg Oral Q6H PRN Gouru, Aruna, MD       Or  . ondansetron (ZOFRAN) injection 4 mg  4 mg Intravenous Q6H PRN Gouru, Aruna, MD   4 mg at 02/01/17 0208  . pantoprazole (PROTONIX) EC tablet 40 mg  40 mg Oral QPM Gouru, Aruna, MD   40 mg at 01/31/17 1821  . sertraline (ZOLOFT) tablet 50 mg  50 mg Oral Daily Gouru, Aruna, MD   50 mg  at 01/31/17 1821  . tiotropium (SPIRIVA) inhalation capsule 18 mcg  18 mcg Inhalation Daily Gouru, Aruna, MD   18 mcg at 01/31/17 1823  . traMADol (ULTRAM) tablet 50-100 mg  50-100 mg Oral Q6H PRN Gladstone Lighter, MD      . vancomycin (VANCOCIN) IVPB 1000 mg/200 mL premix  1,000 mg Intravenous Q12H Lovell Sheehan, MD      . vitamin B-12 (CYANOCOBALAMIN) tablet 500 mcg  500 mcg Oral Daily Gouru, Aruna, MD         Discharge Medications: Please see discharge summary for a list of discharge medications.  Relevant Imaging Results:  Relevant Lab Results:   Additional Information  (SSN: [id number])  Glorian Mcdonell, Veronia Beets, LCSW

## 2017-02-01 NOTE — Clinical Social Work Note (Signed)
Clinical Social Work Assessment  Patient Details  Name: Regina Burke MRN: [id number] Date of Birth: 07-12-1933  Date of referral:  02/01/17               Reason for consult:  Facility Placement                Permission sought to share information with:  Chartered certified accountant granted to share information::  Yes, Verbal Permission Granted  Name::      [place]::   [place]   Relationship::     Contact Information:     Housing/Transportation Living arrangements for the past 2 months:  [place] of Information:  Adult Children Patient Interpreter Needed:  None Criminal Activity/Legal Involvement Pertinent to Current Situation/Hospitalization:  No - Comment as needed Significant Relationships:  Adult Children Lives with:  Adult Children Do you feel safe going back to the place where you live?    Need for family participation in patient care:  Yes (Comment)  Care giving concerns:  Patient lives in [place] with her son [name].    Social Worker assessment / plan:  Holiday representative (CSW) reviewed chart and noted that patient will have surgery today for a hip fracture. CSW attempted to meet with patient prior to surgery however she was off the floor. CSW contacted patient's son [name]. Per son patient lives with him in [place]. CSW explained SNF process and that PT will evaluate patient after surgery. Son prefers SNF over home health. Son is agreeable to SNF search in [facility] and prefers [facility]. FL2 complete and faxed out. CSW will continue to follow and assist as needed.   Employment status:  Retired Nurse, adult PT Recommendations:  Not assessed at this time Information / Referral to community resources:  [place]  Patient/Family's Response to care:  Patient's son is agreeable to [facility] in [place].   Patient/Family's Understanding of and  Emotional Response to Diagnosis, Current Treatment, and Prognosis: Patient's son was very pleasant and thanked CSW for assistance.   Emotional Assessment Appearance:  Appears stated age Attitude/Demeanor/Rapport:    Affect (typically observed):  Accepting, Adaptable, Pleasant Orientation:  Oriented to Self, Oriented to Place, Oriented to  Time, Oriented to Situation Alcohol / Substance use:  Not Applicable Psych involvement (Current and /or in the community):  No (Comment)  Discharge Needs  Concerns to be addressed:  Discharge Planning Concerns Readmission within the last 30 days:  No Current discharge risk:  Dependent with Mobility Barriers to Discharge:  Continued Medical Work up   [employer], Veronia Beets, LCSW 02/01/2017, 2:45 PM

## 2017-02-01 NOTE — Op Note (Signed)
01/31/2017 - 02/01/2017  12:30 PM  PATIENT:  Regina Burke    PRE-OPERATIVE DIAGNOSIS:  LEFT HIP FRACTURE  POST-OPERATIVE DIAGNOSIS:  Same  PROCEDURE:  CANNULATED HIP PINNING  SURGEON:  Lovell Sheehan, MD  ANESTHESIA:   General  PREOPERATIVE INDICATIONS:  Regina Burke is a  83 y.o. female who fell and was found to have a diagnosis of LEFT HIP FRACTURE who elected for surgical management.    The risks benefits and alternatives were discussed with the patient preoperatively including but not limited to infection, bleeding, nerve or blood vessel injury, persistent hip pain,  malunion, nonunion, avascular necrosis, change in lower extremity rotation, leg length discrepancy, failure of the hardware and the need for revision surgery, including the potential for conversion to hemi or total hip arthroplasty. Medical risks include but are not limited to: DVT and pulmonary embolism, myocardial infarction, stroke, pneumonia, respiratory failure and death.  The patient understood and agreed with the plan for surgery.  Patient had the left hip marked with the word yes and my initials according the hospitals correct site of surgery protocol.  OPERATIVE IMPLANTS: 7.3 mm cannulated screws x 3  OPERATIVE FINDINGS: Osteoporotic bone of left hip  OPERATIVE PROCEDURE: The patient was brought to the operating room and a general anesthetic was administered by the anesthesia service.  She was then placed supine on the fracture table. IV antibiotic was administered. The left lower extremity was positioned in a leg holder, without any significant reduction maneuver other than mild internal rotation.  The well leg was placed in hemi-lithotomy position. The hip was prepped and draped in usual sterile fashion.  A time out was performed to verify the patient's name, date of birth, medical record number, correct site of surgery and correct surger to be performed. The  timeout was also used to verify the patient had  received antibiotics that all appropriate instruments, implants and radiographic studies were available in the room. Once all in attendance were in agreement case began..  Once the reduction was deemed near-anatomic, a small lateral incision was made in line with the femur, distal to the greater trochanter, and 3 threaded guidewires were introduced Into the lateral cortex of the femur, across the fracture site and into the femoral head in an inverted triangle configuration. The lengths of these guidepins were measured with a depth gauge. The lateral cortex was then opened with a cannulated drill, and then the cannulated screws were advanced into position and tightened by hand. Solid fixation was achieved.  The guide pins were then removed and final C-arm images were taken of the fracture fixation. The fracture was well reduced and the hardware in good position.  The wound was irrigated copiously, and the deep and subcutaneous tissues were repaired with 0 and 2-0 Vicryl suture respectively and the skin was approximated with staples.  I was scrubbed and present the entire case and all sharp and instrument counts were correct at the conclusion of the case.  I spoke with the patient's family in the postop consultation room to let them know the case was completed without complication and the patient was stable in the recovery room.    Kurtis Bushman, MD

## 2017-02-01 NOTE — Progress Notes (Signed)
[facility] [place] NAME: Regina Burke    MR#:  [id number]  DATE OF BIRTH:  04/10/1933  SUBJECTIVE:  CHIEF COMPLAINT:   Chief Complaint  Patient presents with  . Fall   - Status post left hip surgery, and complains of left hip pain.  REVIEW OF SYSTEMS:  Review of Systems  Constitutional: Negative for chills, fever and malaise/fatigue.  HENT: Positive for congestion. Negative for ear discharge, hearing loss, nosebleeds and sore throat.   Eyes: Negative for blurred vision and double vision.  Respiratory: Negative for cough, shortness of breath and wheezing.   Cardiovascular: Negative for chest pain and palpitations.  Gastrointestinal: Negative for abdominal pain, constipation, diarrhea, nausea and vomiting.  Genitourinary: Negative for dysuria.  Musculoskeletal: Positive for joint pain and myalgias.  Neurological: Negative for dizziness, speech change, focal weakness, seizures and headaches.  Psychiatric/Behavioral: Negative for depression.    DRUG ALLERGIES:   Allergies  Allergen Reactions  . Ciprofloxacin Shortness Of Breath, Itching and Rash  . Doxycycline Shortness Of Breath, Itching and Rash  . Penicillins Shortness Of Breath, Itching, Rash and Other (See Comments)    Has patient had a PCN reaction causing immediate rash, facial/tongue/throat swelling, SOB or lightheadedness with hypotension: Yes Has patient had a PCN reaction causing severe rash involving mucus membranes or skin necrosis: No Has patient had a PCN reaction that required hospitalization No Has patient had a PCN reaction occurring within the last 10 years: No If all of the above answers are "NO", then may proceed with Cephalosporin use.  . Sulfa Antibiotics Shortness Of Breath, Itching and Rash  . Latex Itching  . Morphine And Related Itching  . Albuterol Sulfate   . Cefuroxime Rash    Blisters in mouth    VITALS:  Blood pressure (!) 143/44, pulse 70,  temperature 98.6 F (37 C), temperature source Oral, resp. rate 18, height 5\' 4"  (1.626 m), weight 81.2 kg (179 lb), SpO2 93 %.  PHYSICAL EXAMINATION:  Physical Exam  GENERAL:  84 y.o.-year-old patient lying in the bed with no acute distress.  EYES: Pupils equal, round, reactive to light and accommodation. No scleral icterus. Extraocular muscles intact.  HEENT: Head atraumatic, normocephalic. Oropharynx and nasopharynx clear.  NECK:  Supple, no jugular venous distention. No thyroid enlargement, no tenderness.  LUNGS: Normal breath sounds bilaterally, no wheezing, rales,rhonchi or crepitation. No use of accessory muscles of respiration.  CARDIOVASCULAR: S1, S2 normal. No rubs, or gallops. 2/6 systolic murmur present. ABDOMEN: Soft, nontender, nondistended. Bowel sounds present. No organomegaly or mass.  EXTREMITIES: No pedal edema, cyanosis, or clubbing.  NEUROLOGIC: Cranial nerves II through XII are intact. Muscle strength 5/5 in all extremities. Sensation intact. Gait not checked.  PSYCHIATRIC: The patient is alert and oriented x 3.  SKIN: No obvious rash, lesion, or ulcer.    LABORATORY PANEL:   CBC  Recent Labs Lab 02/01/17 0522  WBC 8.6  HGB 8.9*  HCT 26.9*  PLT 221   ------------------------------------------------------------------------------------------------------------------  Chemistries   Recent Labs Lab 02/01/17 0522  NA 137  K 4.8  CL 103  CO2 24  GLUCOSE 106*  BUN 50*  CREATININE 1.85*  CALCIUM 8.5*  AST 21  ALT 12*  ALKPHOS 47  BILITOT 1.3*   ------------------------------------------------------------------------------------------------------------------  Cardiac Enzymes No results for input(s): TROPONINI in the last 168 hours. ------------------------------------------------------------------------------------------------------------------  RADIOLOGY:  Dg Chest 1 View  Result Date: 01/31/2017 CLINICAL DATA:  Fall with left hip pain EXAM:  CHEST 1 VIEW COMPARISON:  01/23/2017 FINDINGS: Chronically enlarged cardiac silhouette. Pacemaker leads appear unchanged. Chronic aortic atherosclerosis. Less pleural fluid on the left with less left base atelectasis. Chronic post treatment scarring in the right upper lobe as seen previously. IMPRESSION: Radiographic improvement with less left pleural fluid and better aeration of the left lower lobe. Electronically Signed   By: Nelson Chimes M.D.   On: 01/31/2017 11:22   Dg Hip Operative Unilat W Or W/o Pelvis Left  Result Date: 02/01/2017 CLINICAL DATA:  Intraoperative fluoro spot images from ORIF for femoral neck fracture. Fluoro time reported is 47 seconds. EXAM: OPERATIVE left HIP (WITH PELVIS IF PERFORMED) 2 VIEWS TECHNIQUE: Fluoroscopic spot image(s) were submitted for interpretation post-operatively. COMPARISON:  Preop study January 31, 2017 FINDINGS: The patient has undergone placement of 3 cortical screws for a subcapital fracture of the left hip. The screws are in reasonable position radiographically. IMPRESSION: Two fluoro spot images revealing the patient to of undergone ORIF for a subcapital fracture of the left hip without immediate postprocedure complication. Electronically Signed   By: David  Martinique M.D.   On: 02/01/2017 12:16   Dg Hip Unilat With Pelvis 2-3 Views Left  Result Date: 01/31/2017 CLINICAL DATA:  [place] today.  Hip pain. EXAM: DG HIP (WITH OR WITHOUT PELVIS) 2-3V LEFT COMPARISON:  None. FINDINGS: Slightly impacted and angulated femoral neck fracture on the left. No intertrochanteric component. No pelvic component. IMPRESSION: Femoral neck fracture with slight impaction and angulation. Electronically Signed   By: Nelson Chimes M.D.   On: 01/31/2017 11:21    EKG:   Orders placed or performed during the hospital encounter of 01/31/17  . EKG 12-Lead  . EKG 12-Lead    ASSESSMENT AND PLAN:   83 y/o CAD, ischemic cardiomyopathy, CKD stage 4, COPD, afib, IDA  Admitted after a fall and  left hip fracture.  #1  Left femoral neck fracture- appreciate ortho consult, s/p surgery today - monitor pain, hb - physical therapy tomorrow and likely rehab  #2 Chronic afib- toprol No anti coagulation due to gi bleed - appreciate cardiology clearance Recent ECHO with improved EF  #3 CAD- continue home medications. On statin and metoprolol. No blood thinners prior to surgery.  #4 depression and anxiety-continue medications  #5 GERD-on Protonix  #6 DVT prophylaxis-can be started on Lovenox if okay by orthopedics   physical therapy consulted     All the records are reviewed and case discussed with Care Management/Social Workerr. Management plans discussed with the patient, family and they are in agreement.  CODE STATUS: Full Code  TOTAL TIME TAKING CARE OF THIS PATIENT: 38 minutes.   POSSIBLE D/C IN 2 DAYS, DEPENDING ON CLINICAL CONDITION.   Gladstone Lighter M.D on 02/01/2017 at 2:33 PM  Between 7am to 6pm - Pager - [phone]  After 6pm go to [email or web address] - password [facility]  Office  [phone]  CC: Primary care physician; Tracie Harrier, MD

## 2017-02-01 NOTE — Anesthesia Post-op Follow-up Note (Cosign Needed)
Anesthesia QCDR form completed.        

## 2017-02-01 NOTE — Anesthesia Procedure Notes (Signed)
Procedure Name: Intubation Performed by: Demetrius Charity Pre-anesthesia Checklist: Patient identified, Patient being monitored, Timeout performed, Emergency Drugs available and Suction available Patient Re-evaluated:Patient Re-evaluated prior to inductionOxygen Delivery Method: Circle system utilized Preoxygenation: Pre-oxygenation with 100% oxygen Intubation Type: IV induction Ventilation: Mask ventilation without difficulty and Oral airway inserted - appropriate to patient size Laryngoscope Size: Mac and 3 Grade View: Grade I Tube type: Oral Tube size: 7.0 mm Number of attempts: 1 Airway Equipment and Method: Stylet Placement Confirmation: ETT inserted through vocal cords under direct vision,  positive ETCO2 and breath sounds checked- equal and bilateral Secured at: 21 cm Tube secured with: Tape Dental Injury: Teeth and Oropharynx as per pre-operative assessment

## 2017-02-01 NOTE — H&P (Signed)
The patient has been re-examined, and the chart reviewed, and there have been no interval changes to the documented history and physical.  Plan a left hip pinning today.  Anesthesia is not consulted regarding a peripheral nerve block for post-operative pain.  The risks, benefits, and alternatives have been discussed at length, and the patient is willing to proceed.

## 2017-02-01 NOTE — Anesthesia Preprocedure Evaluation (Signed)
Anesthesia Evaluation  Patient identified by MRN, date of birth, ID band Patient awake    Reviewed: Allergy & Precautions, NPO status , Patient's Chart, lab work & pertinent test results  History of Anesthesia Complications Negative for: history of anesthetic complications  Airway Mallampati: III       Dental  (+) Edentulous Upper, Edentulous Lower   Pulmonary asthma , sleep apnea and Continuous Positive Airway Pressure Ventilation , COPD, former smoker,           Cardiovascular hypertension, Pt. on medications and Pt. on home beta blockers + CAD, + Past MI and +CHF (hx of decreased LVEF, last echo 5/18 showed nl ejection fraction)  + dysrhythmias Atrial Fibrillation + pacemaker      Neuro/Psych Depression    GI/Hepatic hiatal hernia, GERD  Medicated,  Endo/Other  diabetes, Type 2, Oral Hypoglycemic Agents  Renal/GU Renal InsufficiencyRenal disease     Musculoskeletal   Abdominal   Peds  Hematology  (+) anemia ,   Anesthesia Other Findings   Reproductive/Obstetrics                             Anesthesia Physical Anesthesia Plan  ASA: III  Anesthesia Plan: General   Post-op Pain Management:    Induction: Intravenous  PONV Risk Score and Plan: 4 or greater and Ondansetron, Dexamethasone, Propofol, Midazolam, Scopolamine patch - Pre-op and Treatment may vary due to age  Airway Management Planned:   Additional Equipment:   Intra-op Plan:   Post-operative Plan:   Informed Consent: I have reviewed the patients History and Physical, chart, labs and discussed the procedure including the risks, benefits and alternatives for the proposed anesthesia with the patient or authorized representative who has indicated his/her understanding and acceptance.     Plan Discussed with:   Anesthesia Plan Comments:         Anesthesia Quick Evaluation

## 2017-02-01 NOTE — Transfer of Care (Signed)
Immediate Anesthesia Transfer of Care Note  Patient: Regina Burke  Procedure(s) Performed: Procedure(s): CANNULATED HIP PINNING (Left)  Patient Location: PACU  Anesthesia Type:General  Level of Consciousness: awake and alert   Airway & Oxygen Therapy: Patient Spontanous Breathing and Patient connected to face mask oxygen  Post-op Assessment: Report given to RN and Post -op Vital signs reviewed and stable  Post vital signs: Reviewed and stable  Last Vitals:  Vitals:   02/01/17 0929 02/01/17 1225  BP: (!) 160/60 (!) 146/51  Pulse: 74 72  Resp: 16 14  Temp: 36.8 C 36.4 C    Last Pain:  Vitals:   02/01/17 0929  TempSrc: Tympanic  PainSc: 5       Patients Stated Pain Goal: 4 (73/66/81 5947)  Complications: No apparent anesthesia complications

## 2017-02-01 NOTE — Care Management Note (Signed)
Case Management Note  Patient Details  Name: Regina Burke MRN: [id number] Date of Birth: 24-Aug-1933  Subjective/Objective:                   Spoke with patient and she states that home health nurse visited 2 hours early [phone]) -scheduled at 37P visit. She states that nurse did call to reschedule and patient was rushing to answer the door- feel.  She said she doesn't want home health now and that she plans to go to [facility] for rehab. Action/Plan: RNCM will continue to follow. I have notified Corene Cornea with [facility] about patient statement and request for nurse to call when changing appointment time. Per Corene Cornea is not a typical home health practice as they always call prior to home visits to confirm with patient. He will address this with office.   Expected Discharge Date:                  Expected Discharge Plan:     In-House Referral:     Discharge planning Services  CM Consult  Post Acute Care Choice:  Home Health Choice offered to:  Patient  DME Arranged:    DME Agency:     HH Arranged:  RN, PT, OT HH Agency:  [facility]  Status of Service:  In process, will continue to follow  If discussed at Long Length of Stay Meetings, dates discussed:    Additional Comments:  Marshell Garfinkel, RN 02/01/2017, 4:49 PM

## 2017-02-02 ENCOUNTER — Ambulatory Visit: Payer: Medicare Other | Admitting: Family

## 2017-02-02 LAB — COMPREHENSIVE METABOLIC PANEL
ALT: 12 U/L — ABNORMAL LOW (ref 14–54)
ANION GAP: 8 (ref 5–15)
AST: 21 U/L (ref 15–41)
Albumin: 3 g/dL — ABNORMAL LOW (ref 3.5–5.0)
Alkaline Phosphatase: 49 U/L (ref 38–126)
BUN: 46 mg/dL — ABNORMAL HIGH (ref 6–20)
CHLORIDE: 104 mmol/L (ref 101–111)
CO2: 24 mmol/L (ref 22–32)
Calcium: 8.4 mg/dL — ABNORMAL LOW (ref 8.9–10.3)
Creatinine, Ser: 1.85 mg/dL — ABNORMAL HIGH (ref 0.44–1.00)
GFR calc non Af Amer: 24 mL/min — ABNORMAL LOW (ref >60)
GFR, EST AFRICAN AMERICAN: 28 mL/min — AB (ref >60)
Glucose, Bld: 127 mg/dL — ABNORMAL HIGH (ref 65–99)
Potassium: 4.8 mmol/L (ref 3.5–5.1)
SODIUM: 136 mmol/L (ref 135–145)
Total Bilirubin: 0.9 mg/dL (ref 0.3–1.2)
Total Protein: 6.5 g/dL (ref 6.5–8.1)

## 2017-02-02 LAB — GLUCOSE, CAPILLARY
GLUCOSE-CAPILLARY: 126 mg/dL — AB (ref 65–99)
Glucose-Capillary: 127 mg/dL — ABNORMAL HIGH (ref 65–99)
Glucose-Capillary: 108 mg/dL — ABNORMAL HIGH (ref 65–99)
Glucose-Capillary: 109 mg/dL — ABNORMAL HIGH (ref 65–99)

## 2017-02-02 MED ORDER — SODIUM CHLORIDE 0.9 % IV SOLN
INTRAVENOUS | Status: DC
  Administered 2017-02-02: 16:00:00 via INTRAVENOUS

## 2017-02-02 NOTE — Progress Notes (Signed)
Pts foley removed around 0600, Pt has not voided. Pt bladder scanned. 174 ml. MD [facility] notified. Orders received for NS @ 60 ml for 10 hours. Bladder scan 4 hours after NS is started. Orders placed.

## 2017-02-02 NOTE — Evaluation (Signed)
Physical Therapy Evaluation Patient Details Name: Regina Burke MRN: [id number] DOB: 01/22/1933 Today's Date: 02/02/2017   History of Present Illness  Pt is an 83 y.o. female presenting to hospital s/p fall sustaining L femoral neck fx.  Pt s/p L cannulated hip pinning 02/01/17.  Pt with recent hospitalization for acute on chronic hypoxic respiratory failure, acute on chronic diastolic CHF, and acute on chronic CKD stage IV (recent temporary dialysis).  PMH includes chronic diastolic CHF, chronic COPD on 2 L home O2 via nasal cannula, h/o a-fib, chronic kidney disease stage IV, recent GI bleed, pacemaker, HOH, hiatal hernia.  Clinical Impression  Prior to hospital admission, pt was ambulating with RW.  Pt lives with her son in 1 level home with step inside home and steps to enter/exit home.  Currently pt is 2 assist with supine to sit and to transfer bed to chair (performed squat pivot to R d/t pt c/o significant L hip pain with any movement; minimal to no WB'ing performed on L LE during session d/t pain).  Pt would benefit from skilled PT to address noted impairments and functional limitations (see below for any additional details).  Upon hospital discharge, recommend pt discharge to [facility].    Follow Up Recommendations SNF    Equipment Recommendations  None recommended by PT (Pt already has RW at home)    Recommendations for Other Services OT consult     Precautions / Restrictions Precautions Precautions: Fall Restrictions Weight Bearing Restrictions: Yes LLE Weight Bearing: Weight bearing as tolerated Other Position/Activity Restrictions: Called Dr. Alvira Philips office to speak with MD AM of 02/02/17 and spoke with Wells Guiles Wells Guiles contacted Peggy from Dr. Gertie Baron office who directly spoke with Dr. Marica Otter) and received verbal that pt was WBAT.      Mobility  Bed Mobility Overal bed mobility: Needs Assistance Bed Mobility: Supine to Sit     Supine to sit: Max assist;+2 for  physical assistance     General bed mobility comments: Assist for trunk and B LE's (increased assist required d/t significant L hip pain with movement)  Transfers Overall transfer level: Needs assistance Equipment used: Rolling walker (2 wheeled) Transfers: Sit to/from [employer] Sit to Stand: Mod assist;Max assist;+2 physical assistance   Squat pivot transfers: Mod assist;Max assist;+2 physical assistance     General transfer comment: assist to initiate and come to full stand with RW (with vc's for hand and feet placement and technique); squat pivot transfer to R with 2 assist  Ambulation/Gait             General Gait Details: Deferred at this time d/t pt unable to maintain standing with RW and 2 assist d/t L hip pain  Stairs            Wheelchair Mobility    Modified Rankin (Stroke Patients Only)       Balance Overall balance assessment: Needs assistance Sitting-balance support: Bilateral upper extremity supported;Feet supported Sitting balance-Leahy Scale: Fair Sitting balance - Comments: static sitting   Standing balance support: Bilateral upper extremity supported (B UE support on RW) Standing balance-Leahy Scale: Fair Standing balance comment: static standing                             Pertinent Vitals/Pain Pain Assessment: 0-10 Pain Score: 3  Pain Location: L hip Pain Descriptors / Indicators: Sore;Tender;Cramping Pain Intervention(s): Limited activity within patient's tolerance;Monitored during session;Premedicated before session;Repositioned  Pt's HR in  70's during session; O2 91% beginning of session and 90% end of session (all on 2 L O2 via nasal cannula): nursing notified of pt's O2 sats end of session who reported she would check on pt    [place] expects to be discharged to:: Private residence Living Arrangements: Children (Pt's son) Available Help at Discharge: Family Type of Home: House Home  Access: Stairs to enter Entrance Stairs-Rails: None Entrance Stairs-Number of Steps: 1 step to get onto porch, one step to get into house, one step inside house between rooms. Can hold onto wall for support with each step. Home Layout: One level Home Equipment: Walker - 2 wheels;Cane - single point;Shower seat;Grab bars - tub/shower;Walker - 4 wheels;Other (comment) Additional Comments: Wears 2 L O2 [place] 24/7 at home    Prior Function Level of Independence: Needs assistance   Gait / Transfers Assistance Needed: Pt uses [facility] when she goes out to the doctor. Mostly stays at home. 3 falls in the last 12 months  ADL's / Homemaking Assistance Needed: Independent for ADLs, assist for IADLs.         Hand Dominance        Extremity/Trunk Assessment   Upper Extremity Assessment Upper Extremity Assessment: Generalized weakness    Lower Extremity Assessment Lower Extremity Assessment: LLE deficits/detail (R LE generalized weakness) LLE Deficits / Details: hip flexion at least 2/5; knee flexion/extension at least 2+/5; DF at least 3/5 (all limited d/t L hip pain) LLE: Unable to fully assess due to pain       Communication   Communication: HOH  Cognition Arousal/Alertness: Awake/alert Behavior During Therapy: Anxious Overall Cognitive Status: Within Functional Limits for tasks assessed                                        General Comments General comments (skin integrity, edema, etc.): Pt c/o significant L hip pain with donning of L sock during session.  Nursing cleared pt for participation in physical therapy.  Pt agreeable to PT session.    Exercises  Pt declined ex's d/t L hip pain.   Assessment/Plan    PT Assessment Patient needs continued PT services  PT Problem List Decreased strength;Decreased activity tolerance;Decreased balance;Decreased mobility;Decreased knowledge of use of DME;Decreased knowledge of precautions;Pain       PT Treatment Interventions  DME instruction;Gait training;Stair training;Functional mobility training;Therapeutic activities;Therapeutic exercise;Balance training;Patient/family education    PT Goals (Current goals can be found in the Care Plan section)  Acute Rehab PT Goals Patient Stated Goal: to go to rehab PT Goal Formulation: With patient Time For Goal Achievement: 02/16/17 Potential to Achieve Goals: Good    Frequency BID   Barriers to discharge Decreased caregiver support      Co-evaluation               AM-PAC PT "6 Clicks" Daily Activity  Outcome Measure Difficulty turning over in bed (including adjusting bedclothes, sheets and blankets)?: Total Difficulty moving from lying on back to sitting on the side of the bed? : Total Difficulty sitting down on and standing up from a chair with arms (e.g., wheelchair, bedside commode, etc,.)?: Total Help needed moving to and from a bed to chair (including a wheelchair)?: Total Help needed walking in hospital room?: Total Help needed climbing 3-5 steps with a railing? : Total 6 Click Score: 6    End of Session Equipment Utilized  During Treatment: Gait belt;Oxygen (2 L O2 via nasal cannula) Activity Tolerance: Patient limited by pain Patient left: in chair;with call bell/phone within reach;with chair alarm set;with SCD's reapplied (B heels elevated via pillow) Nurse Communication: Mobility status;Precautions;Weight bearing status (Pt's pain status and O2 status end of session) PT Visit Diagnosis: Muscle weakness (generalized) (M62.81);History of falling (Z91.81);Difficulty in walking, not elsewhere classified (R26.2);Pain Pain - Right/Left: Left Pain - part of body: Hip    Time: 2583-4621 PT Time Calculation (min) (ACUTE ONLY): 47 min   Charges:   PT Evaluation $PT Eval Low Complexity: 1 Procedure PT Treatments $Therapeutic Activity: 8-22 mins   PT G CodesLeitha Bleak, PT 02/02/17, 11:15 AM [phone]

## 2017-02-02 NOTE — Progress Notes (Signed)
[facility] [place] NAME: Angie Piercey    MR#:  [id number]  DATE OF BIRTH:  [birth year]  SUBJECTIVE:  CHIEF COMPLAINT:   Chief Complaint  Patient presents with  . Fall    - feels better today, POD #1 after left hip surgery - to work with PT today  REVIEW OF SYSTEMS:  Review of Systems  Constitutional: Negative for chills, fever and malaise/fatigue.  HENT: Negative for congestion, ear discharge, hearing loss, nosebleeds and sore throat.   Eyes: Negative for blurred vision and double vision.  Respiratory: Negative for cough, shortness of breath and wheezing.   Cardiovascular: Negative for chest pain and palpitations.  Gastrointestinal: Negative for abdominal pain, constipation, diarrhea, nausea and vomiting.  Genitourinary: Negative for dysuria.  Musculoskeletal: Positive for joint pain and myalgias.  Neurological: Negative for dizziness, speech change, focal weakness, seizures and headaches.  Psychiatric/Behavioral: Negative for depression.    DRUG ALLERGIES:   Allergies  Allergen Reactions  . Ciprofloxacin Shortness Of Breath, Itching and Rash  . Doxycycline Shortness Of Breath, Itching and Rash  . Penicillins Shortness Of Breath, Itching, Rash and Other (See Comments)    Has patient had a PCN reaction causing immediate rash, facial/tongue/throat swelling, SOB or lightheadedness with hypotension: Yes Has patient had a PCN reaction causing severe rash involving mucus membranes or skin necrosis: No Has patient had a PCN reaction that required hospitalization No Has patient had a PCN reaction occurring within the last 10 years: No If all of the above answers are "NO", then may proceed with Cephalosporin use.  . Sulfa Antibiotics Shortness Of Breath, Itching and Rash  . Latex Itching  . Morphine And Related Itching  . Albuterol Sulfate   . Cefuroxime Rash    Blisters in mouth    VITALS:  Blood pressure (!) 156/66, pulse  72, temperature 99.1 F (37.3 C), temperature source Oral, resp. rate 16, height 5\' 4"  (1.626 m), weight 81.2 kg (179 lb), SpO2 93 %.  PHYSICAL EXAMINATION:  Physical Exam  GENERAL:  83 y.o.-year-old patient lying in the bed with no acute distress.  EYES: Pupils equal, round, reactive to light and accommodation. No scleral icterus. Extraocular muscles intact.  HEENT: Head atraumatic, normocephalic. Oropharynx and nasopharynx clear.  NECK:  Supple, no jugular venous distention. No thyroid enlargement, no tenderness.  LUNGS: Normal breath sounds bilaterally, no wheezing, rales,rhonchi or crepitation. No use of accessory muscles of respiration.  CARDIOVASCULAR: S1, S2 normal. No rubs, or gallops. 2/6 systolic murmur present. ABDOMEN: Soft, nontender, nondistended. Bowel sounds present. No organomegaly or mass.  EXTREMITIES: No pedal edema, cyanosis, or clubbing. Left hip dressing in place NEUROLOGIC: Cranial nerves II through XII are intact. Muscle strength 5/5 in all extremities. Sensation intact. Gait not checked.  PSYCHIATRIC: The patient is alert and oriented x 3.  SKIN: No obvious rash, lesion, or ulcer.    LABORATORY PANEL:   CBC  Recent Labs Lab 02/01/17 0522  WBC 8.6  HGB 8.9*  HCT 26.9*  PLT 221   ------------------------------------------------------------------------------------------------------------------  Chemistries   Recent Labs Lab 02/02/17 0420  NA 136  K 4.8  CL 104  CO2 24  GLUCOSE 127*  BUN 46*  CREATININE 1.85*  CALCIUM 8.4*  AST 21  ALT 12*  ALKPHOS 49  BILITOT 0.9   ------------------------------------------------------------------------------------------------------------------  Cardiac Enzymes No results for input(s): TROPONINI in the last 168 hours. ------------------------------------------------------------------------------------------------------------------  RADIOLOGY:  Dg Chest 1 View  Result Date: 01/31/2017 CLINICAL DATA:  Fall with left hip pain EXAM: CHEST 1 VIEW COMPARISON:  01/23/2017 FINDINGS: Chronically enlarged cardiac silhouette. Pacemaker leads appear unchanged. Chronic aortic atherosclerosis. Less pleural fluid on the left with less left base atelectasis. Chronic post treatment scarring in the right upper lobe as seen previously. IMPRESSION: Radiographic improvement with less left pleural fluid and better aeration of the left lower lobe. Electronically Signed   By: Nelson Chimes M.D.   On: 01/31/2017 11:22   Dg Hip Operative Unilat W Or W/o Pelvis Left  Result Date: 02/01/2017 CLINICAL DATA:  Intraoperative fluoro spot images from ORIF for femoral neck fracture. Fluoro time reported is 47 seconds. EXAM: OPERATIVE left HIP (WITH PELVIS IF PERFORMED) 2 VIEWS TECHNIQUE: Fluoroscopic spot image(s) were submitted for interpretation post-operatively. COMPARISON:  Preop study January 31, 2017 FINDINGS: The patient has undergone placement of 3 cortical screws for a subcapital fracture of the left hip. The screws are in reasonable position radiographically. IMPRESSION: Two fluoro spot images revealing the patient to of undergone ORIF for a subcapital fracture of the left hip without immediate postprocedure complication. Electronically Signed   By: David  Martinique M.D.   On: 02/01/2017 12:16   Dg Hip Unilat With Pelvis 2-3 Views Left  Result Date: 01/31/2017 CLINICAL DATA:  [place] today.  Hip pain. EXAM: DG HIP (WITH OR WITHOUT PELVIS) 2-3V LEFT COMPARISON:  None. FINDINGS: Slightly impacted and angulated femoral neck fracture on the left. No intertrochanteric component. No pelvic component. IMPRESSION: Femoral neck fracture with slight impaction and angulation. Electronically Signed   By: Nelson Chimes M.D.   On: 01/31/2017 11:21    EKG:   Orders placed or performed during the hospital encounter of 01/31/17  . EKG 12-Lead  . EKG 12-Lead    ASSESSMENT AND PLAN:   83 y/o CAD, ischemic cardiomyopathy, CKD stage 4, COPD, afib,  IDA  Admitted after a fall and left hip fracture.  #1  Left femoral neck fracture- appreciate ortho consult, POD #1 today - monitor pain mgmt, hb check - physical therapy today and likely rehab  #2 Chronic afib- toprol No anti coagulation due to gi bleed - appreciate cardiology clearance Recent ECHO with improved EF  #3 CAD- continue home medications. On statin and metoprolol. No blood thinners prior to surgery.  #4 depression and anxiety-continue medications  #5 GERD-on Protonix  #6 DVT prophylaxis- lovenox   physical therapy consulted     All the records are reviewed and case discussed with Care Management/Social Workerr. Management plans discussed with the patient, family and they are in agreement.  CODE STATUS: Full Code  TOTAL TIME TAKING CARE OF THIS PATIENT: 36 minutes.   POSSIBLE D/C IN 1-2 DAYS, DEPENDING ON CLINICAL CONDITION.   Gladstone Lighter M.D on 02/02/2017 at 8:51 AM  Between 7am to 6pm - Pager - [phone]  After 6pm go to [email or web address] - password [email or web address]  Office  [phone]  CC: Primary care physician; Tracie Harrier, MD

## 2017-02-02 NOTE — Evaluation (Addendum)
Occupational Therapy Evaluation Patient Details Name: Regina Burke MRN: [id number] DOB: May 10, 1933 Today's Date: 02/02/2017    History of Present Illness Pt is an 83 y.o. female who was admitted to [facility] for a Left femoral neck fx.  Pt s/p L cannulated hip pinning 02/01/17. Pt. has had a recent hospitalization for acute on chronic hypoxic respiratory failure, acute on chronic diastolic CHF, and acute on chronic CKD stage IV (recent temporary dialysis).  PMH includes chronic diastolic CHF, chronic COPD on 2 L home O2 via nasal cannula, h/o a-fib, chronic kidney disease stage IV, recent GI bleed, pacemaker, HOH, hiatal hernia.   Clinical Impression   Pt. Is an 83 y.o. female who was admitted to [facility] for a left Femoral Neck Fracture. Pt. Is on 2LO2 vis [place]. SO2 91-95%. Pt. Reports she fell on her first day of home health therapy. Pt. Reports she does not want any home health therapy services at all. Pt. presents with weakness, decreased activity tolerance, and impaired functional mobility which hinder her ability to complete ADL, and IADL functioning. Pt. could benefit from skilled OT services for ADL training, A/E training, UE there. Ex, there. Act., and pt. education about home modification, and DME. Pt. Could benefit from SNF level of care upon discharge with follow-up OT services.    Follow Up Recommendations  SNF    Equipment Recommendations       Recommendations for Other Services       Precautions / Restrictions Precautions Precautions: Fall Restrictions Weight Bearing Restrictions: Yes LLE Weight Bearing: Weight bearing as tolerated              ADL either performed or assessed with clinical judgement   ADL Overall ADL's : Needs assistance/impaired Eating/Feeding: Set up   Grooming: Set up   Upper Body Bathing: Minimal assistance   Lower Body Bathing: Maximal assistance   Upper Body Dressing : Minimal assistance   Lower Body Dressing: Maximal assistance                Functional mobility during ADLs: Maximal assistance General ADL Comments: Pt. education was provided about A/E use for LE ADLs.     Vision Patient Visual Report: No change from baseline       Perception     Praxis      Pertinent Vitals/Pain Pain Assessment: 0-10 Pain Score: 3  Pain Location: Left Hip Pain Descriptors / Indicators: Sore;Tender;Cramping Pain Intervention(s): Limited activity within patient's tolerance;Monitored during session;Premedicated before session;Repositioned     Hand Dominance Right   Extremity/Trunk Assessment Upper Extremity Assessment Upper Extremity Assessment: Generalized weakness       Communication Communication Communication: HOH   Cognition Arousal/Alertness: Awake/alert Behavior During Therapy: Anxious Overall Cognitive Status: Within Functional Limits for tasks assessed                                     General Comments  Pt c/o significant L hip pain with donning of L sock during session.    Exercises     Shoulder Instructions      Home Living Family/patient expects to be discharged to:: Private residence Living Arrangements: Children (Son) Available Help at Discharge: Family Type of Home: House Home Access: Stairs to enter Technical brewer of Steps: 2 Entrance Stairs-Rails: None Home Layout: One level     Bathroom Shower/Tub: Teacher, early years/pre: Handicapped height     Home  Equipment: Gilford Rile - 2 wheels;Cane - single point;Shower seat;Grab bars - tub/shower;Walker - 4 wheels;Other (comment)   Additional Comments: Wears 2 L O2 [place] 24/7 at home      Prior Functioning/Environment Level of Independence: Needs assistance  Gait / Transfers Assistance Needed: Pt uses SPC when she goes out to the doctor. Mostly stays at home. 3 falls in the last 12 months ADL's / Homemaking Assistance Needed: Independent  with ADLs, assist for IADLs, meal prep, and medication management.             OT Problem List: Decreased strength;Impaired UE functional use;Decreased knowledge of precautions;Impaired balance (sitting and/or standing);Decreased knowledge of use of DME or AE;Decreased activity tolerance;Pain      OT Treatment/Interventions: Self-care/ADL training;Therapeutic exercise;Patient/family education;Therapeutic activities;DME and/or AE instruction;Energy conservation    OT Goals(Current goals can be found in the care plan section) Acute Rehab OT Goals Patient Stated Goal: To go to rehab OT Goal Formulation: With patient Potential to Achieve Goals: Good  OT Frequency: Min 1X/week   Barriers to D/C:            Co-evaluation              AM-PAC PT "6 Clicks" Daily Activity     Outcome Measure Help from another person eating meals?: None Help from another person taking care of personal grooming?: None Help from another person toileting, which includes using toliet, bedpan, or urinal?: A Lot Help from another person bathing (including washing, rinsing, drying)?: A Lot Help from another person to put on and taking off regular upper body clothing?: A Little Help from another person to put on and taking off regular lower body clothing?: A Lot 6 Click Score: 17   End of Session    Activity Tolerance: Patient tolerated treatment well Patient left: with call bell/phone within reach;in chair;with chair alarm set  OT Visit Diagnosis: Muscle weakness (generalized) (M62.81);History of falling (Z91.81)                Time: 1108-1130 OT Time Calculation (min): 22 min Charges:  OT General Charges $OT Visit: 1 Procedure OT Evaluation $OT Eval Moderate Complexity: 1 Procedure G-Codes:     Harrel Carina, MS, OTR/L Harrel Carina, MS ,OTR/L 02/02/2017, 1:43 PM

## 2017-02-02 NOTE — Progress Notes (Signed)
Subjective:  Patient reports pain as mild.  She is having some leg spasms.  Objective:   VITALS:   Vitals:   02/01/17 2120 02/01/17 2257 02/02/17 0421 02/02/17 0749  BP: (!) 147/60 (!) 144/49 (!) 157/45 (!) 156/66  Pulse: 70 71 69 72  Resp:   18 16  Temp:  99.2 F (37.3 C) 98.3 F (36.8 C) 99.1 F (37.3 C)  TempSrc:  Oral Oral Oral  SpO2: 98% 95% 94% 93%  Weight:      Height:        PHYSICAL EXAM:  Neurovascular intact Sensation intact distally Incision: dressing C/D/I  LABS  Results for orders placed or performed during the hospital encounter of 01/31/17 (from the past 24 hour(s))  Glucose, capillary     Status: Abnormal   Collection Time: 02/01/17  7:56 AM  Result Value Ref Range   Glucose-Capillary 108 (H) 65 - 99 mg/dL  Glucose, capillary     Status: Abnormal   Collection Time: 02/01/17 12:31 PM  Result Value Ref Range   Glucose-Capillary 136 (H) 65 - 99 mg/dL  Glucose, capillary     Status: Abnormal   Collection Time: 02/01/17  4:15 PM  Result Value Ref Range   Glucose-Capillary 166 (H) 65 - 99 mg/dL  Glucose, capillary     Status: Abnormal   Collection Time: 02/01/17  9:19 PM  Result Value Ref Range   Glucose-Capillary 142 (H) 65 - 99 mg/dL   Comment 1 Notify RN   Comprehensive metabolic panel     Status: Abnormal   Collection Time: 02/02/17  4:20 AM  Result Value Ref Range   Sodium 136 135 - 145 mmol/L   Potassium 4.8 3.5 - 5.1 mmol/L   Chloride 104 101 - 111 mmol/L   CO2 24 22 - 32 mmol/L   Glucose, Bld 127 (H) 65 - 99 mg/dL   BUN 46 (H) 6 - 20 mg/dL   Creatinine, Ser 1.85 (H) 0.44 - 1.00 mg/dL   Calcium 8.4 (L) 8.9 - 10.3 mg/dL   Total Protein 6.5 6.5 - 8.1 g/dL   Albumin 3.0 (L) 3.5 - 5.0 g/dL   AST 21 15 - 41 U/L   ALT 12 (L) 14 - 54 U/L   Alkaline Phosphatase 49 38 - 126 U/L   Total Bilirubin 0.9 0.3 - 1.2 mg/dL   GFR calc non Af Amer 24 (L) >60 mL/min   GFR calc Af Amer 28 (L) >60 mL/min   Anion gap 8 5 - 15  Glucose, capillary      Status: Abnormal   Collection Time: 02/02/17  7:42 AM  Result Value Ref Range   Glucose-Capillary 127 (H) 65 - 99 mg/dL   Comment 1 Notify RN    Comment 2 Document in Chart     Dg Chest 1 View  Result Date: 01/31/2017 CLINICAL DATA:  Fall with left hip pain EXAM: CHEST 1 VIEW COMPARISON:  01/23/2017 FINDINGS: Chronically enlarged cardiac silhouette. Pacemaker leads appear unchanged. Chronic aortic atherosclerosis. Less pleural fluid on the left with less left base atelectasis. Chronic post treatment scarring in the right upper lobe as seen previously. IMPRESSION: Radiographic improvement with less left pleural fluid and better aeration of the left lower lobe. Electronically Signed   By: Nelson Chimes M.D.   On: 01/31/2017 11:22   Dg Hip Operative Unilat W Or W/o Pelvis Left  Result Date: 02/01/2017 CLINICAL DATA:  Intraoperative fluoro spot images from ORIF for femoral neck fracture. Fluoro time  reported is 47 seconds. EXAM: OPERATIVE left HIP (WITH PELVIS IF PERFORMED) 2 VIEWS TECHNIQUE: Fluoroscopic spot image(s) were submitted for interpretation post-operatively. COMPARISON:  Preop study January 31, 2017 FINDINGS: The patient has undergone placement of 3 cortical screws for a subcapital fracture of the left hip. The screws are in reasonable position radiographically. IMPRESSION: Two fluoro spot images revealing the patient to of undergone ORIF for a subcapital fracture of the left hip without immediate postprocedure complication. Electronically Signed   By: David  Martinique M.D.   On: 02/01/2017 12:16   Dg Hip Unilat With Pelvis 2-3 Views Left  Result Date: 01/31/2017 CLINICAL DATA:  [place] today.  Hip pain. EXAM: DG HIP (WITH OR WITHOUT PELVIS) 2-3V LEFT COMPARISON:  None. FINDINGS: Slightly impacted and angulated femoral neck fracture on the left. No intertrochanteric component. No pelvic component. IMPRESSION: Femoral neck fracture with slight impaction and angulation. Electronically Signed   By: Nelson Chimes M.D.   On: 01/31/2017 11:21    Assessment/Plan: 1 Day Post-Op   Principal Problem:   S/p left hip fracture Active Problems:   Cancer of upper lobe of right lung (HCC)   Anemia associated with chronic renal failure   Diabetes mellitus, type 2 ([place])   Coronary artery disease involving native coronary artery of native heart without angina pectoris   S/P coronary artery stent placement   Centrilobular emphysema ([place])   Essential hypertension   Persistent atrial fibrillation (HCC)   S/P AV nodal ablation   Cardiac resynchronization therapy pacemaker (CRT-P) in place   GI bleed   Pre-operative cardiovascular examination   Up with therapy Discharge home with home health   Lovell Sheehan , MD 02/02/2017, 7:53 AM

## 2017-02-02 NOTE — Progress Notes (Signed)
Physical Therapy Treatment Patient Details Name: Regina Burke MRN: [id number] DOB: July 22, 1933 Today's Date: 02/02/2017    History of Present Illness Pt is an 83 y.o. female presenting to hospital s/p fall sustaining L femoral neck fx.  Pt s/p L cannulated hip pinning 02/01/17.  Pt with recent hospitalization for acute on chronic hypoxic respiratory failure, acute on chronic diastolic CHF, and acute on chronic CKD stage IV (recent temporary dialysis).  PMH includes chronic diastolic CHF, chronic COPD on 2 L home O2 via nasal cannula, h/o a-fib, chronic kidney disease stage IV, recent GI bleed, pacemaker, HOH, hiatal hernia.    PT Comments    Pt requiring significant extra time to perform squat pivot transfer to R chair to bed d/t c/o significant L hip pain with any movement (even lowering of leg-rests on chair).  Pt appearing tearful initially with set-up for transfer (and lowering of recliner's legrests) but did fairly well (in terms of pain) with transfer chair to bed (minimal WB'ing performed on L LE with transfer to R side) and then laying down in bed with 2 assist. Pt did not rate pain during mobility but reported 4/10 L hip pain end of session resting in bed.  Will continue to progress pt with functional mobility per pt tolerance.   Follow Up Recommendations  SNF     Equipment Recommendations  None recommended by PT (Pt already has RW at home)    Recommendations for Other Services OT consult     Precautions / Restrictions Precautions Precautions: Fall Restrictions Weight Bearing Restrictions: Yes LLE Weight Bearing: Weight bearing as tolerated Other Position/Activity Restrictions: Called Dr. Alvira Philips office to speak with MD AM of 02/02/17 and spoke with Wells Guiles Wells Guiles contacted Peggy from Dr. Gertie Baron office who directly spoke with Dr. Marica Otter) and received verbal that pt was WBAT.    Mobility  Bed Mobility Overal bed mobility: Needs Assistance Bed Mobility: Sit to  Supine     Sit to supine: Max assist;+2 for physical assistance   General bed mobility comments: Assist for trunk and B LE's (increased assist required d/t significant L hip pain with movement)  Transfers Overall transfer level: Needs assistance Equipment used: None Transfers: Squat Pivot Transfers   Squat pivot transfers: Mod assist;Max assist;+2 physical assistance     General transfer comment: squat pivot to R with 2 assist  Ambulation/Gait             General Gait Details: Deferred d/t pt's c/o significant L hip pain with any movement   Stairs            Wheelchair Mobility    Modified Rankin (Stroke Patients Only)       Balance Overall balance assessment: Needs assistance Sitting-balance support: Bilateral upper extremity supported;Feet supported Sitting balance-Leahy Scale: Fair Sitting balance - Comments: static sitting   Standing balance support: Bilateral upper extremity supported (B UE support on RW) Standing balance-Leahy Scale: Fair Standing balance comment: static standing                            Cognition Arousal/Alertness: Awake/alert Behavior During Therapy: Anxious Overall Cognitive Status: Within Functional Limits for tasks assessed                                        Exercises      General Comments General comments (  skin integrity, edema, etc.): Pt c/o significant L hip pain with lowering of recliner's legrest.      Pertinent Vitals/Pain Pain Assessment: 0-10 Pain Score: 4  Pain Location: Left Hip Pain Descriptors / Indicators: Sore;Tender;Cramping Pain Intervention(s): Limited activity within patient's tolerance;Monitored during session;Repositioned;Patient requesting pain meds-RN notified  HR WFL during session.  O2 90-91% on 2 L O2 via nasal cannula end of session (nursing notified).    Home Living Family/patient expects to be discharged to:: Private residence Living Arrangements:  Children (Son) Available Help at Discharge: Family Type of Home: House Home Access: Stairs to enter Entrance Stairs-Rails: None Home Layout: One level Home Equipment: Environmental consultant - 2 wheels;Cane - single point;Shower seat;Grab bars - tub/shower;Walker - 4 wheels;Other (comment) Additional Comments: Wears 2 L O2 [place] 24/7 at home    Prior Function Level of Independence: Needs assistance  Gait / Transfers Assistance Needed: Pt uses SPC when she goes out to the doctor. Mostly stays at home. 3 falls in the last 12 months ADL's / Homemaking Assistance Needed: Independent  with ADLs, assist for IADLs, meal prep, and medication management.     PT Goals (current goals can now be found in the care plan section) Acute Rehab PT Goals Patient Stated Goal: To go to rehab PT Goal Formulation: With patient Time For Goal Achievement: 02/16/17 Potential to Achieve Goals: Good Progress towards PT goals: Progressing toward goals    Frequency    BID      PT Plan Current plan remains appropriate    Co-evaluation              AM-PAC PT "6 Clicks" Daily Activity  Outcome Measure  Difficulty turning over in bed (including adjusting bedclothes, sheets and blankets)?: Total Difficulty moving from lying on back to sitting on the side of the bed? : Total Difficulty sitting down on and standing up from a chair with arms (e.g., wheelchair, bedside commode, etc,.)?: Total Help needed moving to and from a bed to chair (including a wheelchair)?: Total Help needed walking in hospital room?: Total Help needed climbing 3-5 steps with a railing? : Total 6 Click Score: 6    End of Session Equipment Utilized During Treatment: Gait belt;Oxygen (2 L O2 via nasal cannula) Activity Tolerance: Patient limited by pain Patient left: in bed;with call bell/phone within reach;with bed alarm set;with SCD's reapplied (B heels elevated via pillows) Nurse Communication: Mobility status;Precautions;Weight bearing status  (Pt's O2 status end of session) PT Visit Diagnosis: Muscle weakness (generalized) (M62.81);History of falling (Z91.81);Difficulty in walking, not elsewhere classified (R26.2);Pain Pain - Right/Left: Left Pain - part of body: Hip     Time: 1311-1350 PT Time Calculation (min) (ACUTE ONLY): 39 min  Charges:  $Therapeutic Activity: 38-52 mins                    G CodesLeitha Bleak, PT 02/02/17, 2:07 PM [phone]

## 2017-02-02 NOTE — Anesthesia Postprocedure Evaluation (Signed)
Anesthesia Post Note  Patient: Regina Burke  Procedure(s) Performed: Procedure(s) (LRB): CANNULATED HIP PINNING (Left)  Patient location during evaluation: PACU Anesthesia Type: General Level of consciousness: awake and alert Pain management: pain level controlled Vital Signs Assessment: post-procedure vital signs reviewed and stable Respiratory status: spontaneous breathing and respiratory function stable Cardiovascular status: stable Anesthetic complications: no     Last Vitals:  Vitals:   02/02/17 0421 02/02/17 0749  BP: (!) 157/45 (!) 156/66  Pulse: 69 72  Resp: 18 16  Temp: 36.8 C 37.3 C    Last Pain:  Vitals:   02/02/17 0749  TempSrc: Oral  PainSc: 5                  KEPHART,WILLIAM K

## 2017-02-02 NOTE — Progress Notes (Addendum)
PT is recommending SNF. Plan is for patient to D/C to [facility] tomorrow if medically stable. Patient is aware of above. Clinical Education officer, museum (CSW) contacted patient's son [name] and made him aware of above. [facility] liaison is aware of above. CSW will continue to follow and assist as needed.   [employer], LCSW [phone]

## 2017-02-03 LAB — GLUCOSE, CAPILLARY
Glucose-Capillary: 103 mg/dL — ABNORMAL HIGH (ref 65–99)
Glucose-Capillary: 136 mg/dL — ABNORMAL HIGH (ref 65–99)
Glucose-Capillary: 126 mg/dL — ABNORMAL HIGH (ref 65–99)
Glucose-Capillary: 107 mg/dL — ABNORMAL HIGH (ref 65–99)

## 2017-02-03 LAB — BASIC METABOLIC PANEL
Anion gap: 9 (ref 5–15)
BUN: 51 mg/dL — AB (ref 6–20)
CHLORIDE: 104 mmol/L (ref 101–111)
CO2: 22 mmol/L (ref 22–32)
CREATININE: 2.23 mg/dL — AB (ref 0.44–1.00)
Calcium: 8.4 mg/dL — ABNORMAL LOW (ref 8.9–10.3)
GFR calc Af Amer: 23 mL/min — ABNORMAL LOW (ref >60)
GFR calc non Af Amer: 19 mL/min — ABNORMAL LOW (ref >60)
GLUCOSE: 114 mg/dL — AB (ref 65–99)
POTASSIUM: 5.2 mmol/L — AB (ref 3.5–5.1)
SODIUM: 135 mmol/L (ref 135–145)

## 2017-02-03 LAB — CBC
HEMATOCRIT: 24.1 % — AB (ref 35.0–47.0)
Hemoglobin: 7.9 g/dL — ABNORMAL LOW (ref 12.0–16.0)
MCH: 27.3 pg (ref 26.0–34.0)
MCHC: 32.9 g/dL (ref 32.0–36.0)
MCV: 82.9 fL (ref 80.0–100.0)
PLATELETS: 240 10*3/uL (ref 150–440)
RBC: 2.91 MIL/uL — ABNORMAL LOW (ref 3.80–5.20)
RDW: 17.9 % — AB (ref 11.5–14.5)
WBC: 14.1 10*3/uL — AB (ref 3.6–11.0)

## 2017-02-03 LAB — PREPARE RBC (CROSSMATCH)

## 2017-02-03 MED ORDER — SODIUM CHLORIDE 0.9 % IV SOLN
Freq: Once | INTRAVENOUS | Status: AC
  Administered 2017-02-03: 15:00:00 via INTRAVENOUS

## 2017-02-03 MED ORDER — IPRATROPIUM-ALBUTEROL 0.5-2.5 (3) MG/3ML IN SOLN
3.0000 mL | RESPIRATORY_TRACT | Status: AC
  Administered 2017-02-03: 3 mL via RESPIRATORY_TRACT

## 2017-02-03 MED ORDER — IPRATROPIUM-ALBUTEROL 0.5-2.5 (3) MG/3ML IN SOLN
RESPIRATORY_TRACT | Status: AC
  Filled 2017-02-03: qty 3

## 2017-02-03 MED ORDER — POLYETHYLENE GLYCOL 3350 17 G PO PACK: 17.0000 g | PACK | Freq: Every day | ORAL | Status: DC | PRN

## 2017-02-03 NOTE — Progress Notes (Signed)
Physical Therapy Treatment Patient Details Name: Regina Burke MRN: [id number] DOB: 04/25/1933 Today's Date: 02/03/2017    History of Present Illness Pt is an 83 y.o. female presenting to hospital s/p fall sustaining L femoral neck fx.  Pt s/p L cannulated hip pinning 02/01/17.  Pt with recent hospitalization for acute on chronic hypoxic respiratory failure, acute on chronic diastolic CHF, and acute on chronic CKD stage IV (recent temporary dialysis).  PMH includes chronic diastolic CHF, chronic COPD on 2 L home O2 via nasal cannula, h/o a-fib, chronic kidney disease stage IV, recent GI bleed, pacemaker, HOH, hiatal hernia.    PT Comments    Participated in exercises as described below.  Supine to/from sit with max a x 2.  Squat pivot to right x 2 with mod a x 2. Overall improved mobility and less anxiety. Upon getting up in chair, nursing requested her to be returned to bed for Foley insertion.  Will continue as appropriate.   Follow Up Recommendations  SNF     Equipment Recommendations  None recommended by PT    Recommendations for Other Services OT consult     Precautions / Restrictions Precautions Precautions: Fall Restrictions Weight Bearing Restrictions: Yes LLE Weight Bearing: Weight bearing as tolerated Other Position/Activity Restrictions: Called Dr. Alvira Philips office to speak with MD AM of 02/02/17 and spoke with Wells Guiles Wells Guiles contacted Peggy from Dr. Gertie Baron office who directly spoke with Dr. Marica Otter) and received verbal that pt was WBAT.    Mobility  Bed Mobility Overal bed mobility: Needs Assistance Bed Mobility: Sit to Supine     Supine to sit: Max assist;+2 for physical assistance Sit to supine: Max assist;+2 for physical assistance      Transfers Overall transfer level: Needs assistance Equipment used: None Transfers: Squat Pivot Transfers Sit to Stand: Mod assist;+2 physical assistance   Squat pivot transfers: Mod assist;+2 physical  assistance     General transfer comment: squat pivot to right with limited WB LLE  Ambulation/Gait                 Stairs            Wheelchair Mobility    Modified Rankin (Stroke Patients Only)       Balance Overall balance assessment: Needs assistance Sitting-balance support: Bilateral upper extremity supported;Feet supported   Sitting balance - Comments: static sitting                                    Cognition Arousal/Alertness: Awake/alert Behavior During Therapy: WFL for tasks assessed/performed Overall Cognitive Status: Within Functional Limits for tasks assessed                                 General Comments: less anxious today.      Exercises Other Exercises Other Exercises: supine AAROM x 10 for ankle pumps, quad and glut sets, SLR, ab/add, heel slides and SAQ    General Comments        Pertinent Vitals/Pain Pain Assessment: 0-10 Pain Score: 4  Pain Location: Left Hip Pain Descriptors / Indicators: Sore;Tender;Cramping Pain Intervention(s): Limited activity within patient's tolerance;Ice applied    Home Living                      Prior Function  PT Goals (current goals can now be found in the care plan section) Progress towards PT goals: Progressing toward goals    Frequency    BID      PT Plan Current plan remains appropriate    Co-evaluation              AM-PAC PT "6 Clicks" Daily Activity  Outcome Measure  Difficulty turning over in bed (including adjusting bedclothes, sheets and blankets)?: Total Difficulty moving from lying on back to sitting on the side of the bed? : Total Difficulty sitting down on and standing up from a chair with arms (e.g., wheelchair, bedside commode, etc,.)?: Total Help needed moving to and from a bed to chair (including a wheelchair)?: Total Help needed walking in hospital room?: Total Help needed climbing 3-5 steps with a railing?  : Total 6 Click Score: 6    End of Session Equipment Utilized During Treatment: Gait belt;Oxygen Activity Tolerance: Patient limited by pain Patient left: in bed;with call bell/phone within reach;with bed alarm set   Pain - Right/Left: Left Pain - part of body: Hip     Time: 8115-7262 PT Time Calculation (min) (ACUTE ONLY): 25 min  Charges:  $Therapeutic Exercise: 8-22 mins $Therapeutic Activity: 8-22 mins                    G Codes:       Chesley Noon, PTA 02/03/17, 10:52 AM

## 2017-02-03 NOTE — Progress Notes (Signed)
[facility], [place] 02/03/17  Subjective:  Patient readmitted with left hip fracture. She is status post repair. At the last admission patient required temporary hemodialysis. However renal function did improve. Creatinine now rising again and BUN is currently 51 with a creatinine of 2.2. Patient did experience some blood loss with the surgery. Hemoglobin currently down to 7.9.  Objective:  Vital signs in last 24 hours:  Temp:  [98 F (36.7 C)-98.4 F (36.9 C)] 98.2 F (36.8 C) (06/09 0853) Pulse Rate:  [69-71] 69 (06/09 0853) Resp:  [17-20] 20 (06/09 0853) BP: (127-132)/(47-86) 132/86 (06/09 0853) SpO2:  [93 %-96 %] 96 % (06/09 0853)  Weight change:  Filed Weights   01/31/17 1041 02/01/17 0929  Weight: 81.2 kg (179 lb) 81.2 kg (179 lb)    Intake/Output:    Intake/Output Summary (Last 24 hours) at 02/03/17 1141 Last data filed at 02/03/17 0800  Gross per 24 hour  Intake          1085.83 ml  Output                0 ml  Net          1085.83 ml     Physical Exam: General: Laying in bed, no acute distress  HEENT Moist oral mucous membranes   Neck Supple   Pulm/lungs Clear bilaterally, normal effort  CVS/Heart irregular, no rub    Abdomen:  Soft,  Nondistended, BS present  Extremities: + peripheral edema   Neurologic: Alert, oriented, follows commands  Skin: No acute rashes           Basic Metabolic Panel:   Recent Labs Lab 01/29/17 1415 01/31/17 1147 02/01/17 0522 02/02/17 0420 02/03/17 0256  NA 137 140 137 136 135  K 4.2 5.1 4.8 4.8 5.2*  CL 101 102 103 104 104  CO2 26 27 24 24 22   GLUCOSE 98 116* 106* 127* 114*  BUN 52* 53* 50* 46* 51*  CREATININE 1.88* 1.75* 1.85* 1.85* 2.23*  CALCIUM 8.8* 9.0 8.5* 8.4* 8.4*     CBC:  Recent Labs Lab 01/28/17 0518 01/31/17 1147 02/01/17 0522 02/03/17 0256  WBC  --  10.6 8.6 14.1*  NEUTROABS  --  9.5*  --   --   HGB 8.7* 9.5* 8.9* 7.9*  HCT  --  29.0* 26.9* 24.1*  MCV  --   82.5 82.3 82.9  PLT  --  253 221 240      Lab Results  Component Value Date   HEPBSAG Negative 01/26/2017   HEPBSAB Non Reactive 01/26/2017      Microbiology:  Recent Results (from the past 240 hour(s))  Urine culture     Status: None   Collection Time: 01/24/17  3:24 PM  Result Value Ref Range Status   Specimen Description URINE, RANDOM  Final   Special Requests NONE  Final   Culture   Final    NO GROWTH Performed at [facility], [address], [place], [place] [address]    Report Status 01/26/2017 FINAL  Final  MRSA PCR Screening     Status: None   Collection Time: 01/27/17  9:58 AM  Result Value Ref Range Status   MRSA by PCR NEGATIVE NEGATIVE Final    Comment:        The GeneXpert MRSA Assay (FDA approved for NASAL specimens only), is one component of a comprehensive MRSA colonization surveillance program. It is not intended to diagnose MRSA infection nor  to guide or monitor treatment for MRSA infections.   Surgical pcr screen     Status: None   Collection Time: 01/31/17  6:50 PM  Result Value Ref Range Status   MRSA, PCR NEGATIVE NEGATIVE Final   Staphylococcus aureus NEGATIVE NEGATIVE Final    Comment:        The Xpert SA Assay (FDA approved for NASAL specimens in patients over 80 years of age), is one component of a comprehensive surveillance program.  Test performance has been validated by [employer] [facility] for patients greater than or equal to 19 year old. It is not intended to diagnose infection nor to guide or monitor treatment.     Coagulation Studies:  Recent Labs  01/31/17 1147 02/01/17 0522  LABPROT 17.0* 17.6*  INR 1.37 1.43    Urinalysis:  Recent Labs  01/31/17 1242  COLORURINE YELLOW*  LABSPEC 1.014  PHURINE 5.0  GLUCOSEU NEGATIVE  HGBUR NEGATIVE  BILIRUBINUR NEGATIVE  KETONESUR NEGATIVE  PROTEINUR NEGATIVE  NITRITE NEGATIVE  LEUKOCYTESUR NEGATIVE      Imaging: Dg Hip Operative Unilat W Or W/o Pelvis  Left  Result Date: 02/01/2017 CLINICAL DATA:  Intraoperative fluoro spot images from ORIF for femoral neck fracture. Fluoro time reported is 47 seconds. EXAM: OPERATIVE left HIP (WITH PELVIS IF PERFORMED) 2 VIEWS TECHNIQUE: Fluoroscopic spot image(s) were submitted for interpretation post-operatively. COMPARISON:  Preop study January 31, 2017 FINDINGS: The patient has undergone placement of 3 cortical screws for a subcapital fracture of the left hip. The screws are in reasonable position radiographically. IMPRESSION: Two fluoro spot images revealing the patient to of undergone ORIF for a subcapital fracture of the left hip without immediate postprocedure complication. Electronically Signed   By: David  Martinique M.D.   On: 02/01/2017 12:16     Medications:   . lactated ringers 50 mL/hr at 02/01/17 1514   . ALPRAZolam  0.5 mg Oral QHS  . atorvastatin  20 mg Oral QPM  . cholecalciferol  400 Units Oral Daily  . enoxaparin (LOVENOX) injection  30 mg Subcutaneous Q24H  . fenofibrate  54 mg Oral Daily  . ferrous sulfate  325 mg Oral Q breakfast  . fluticasone furoate-vilanterol  1 puff Inhalation Daily  . furosemide  40 mg Oral Daily  . insulin aspart  0-5 Units Subcutaneous QHS  . insulin aspart  0-9 Units Subcutaneous TID WC  . ipratropium-albuterol      . metoprolol succinate  12.5 mg Oral Daily  . multivitamin-lutein  2 capsule Oral Daily  . pantoprazole  40 mg Oral QPM  . sertraline  50 mg Oral Daily  . tiotropium  18 mcg Inhalation Daily  . cyanocobalamin  500 mcg Oral Daily   acetaminophen **OR** acetaminophen, albuterol, fentaNYL (SUBLIMAZE) injection, nitroGLYCERIN, ondansetron **OR** ondansetron (ZOFRAN) IV, polyethylene glycol, traMADol  Assessment/ Plan:  83 y.o.caucasian female with diabetes mellitus type II, coronary artery disease, hypertension, hyperlipidemia, generalized anxiety disorder, gout, anemia, history of lung cancer, COPD, atrial fibrillation, biventricular ICD, fatty liver  disease, GERD, osteoporosis   1. ARF on CKD st 4 Baseline creatinine of 2/GFR 22 noted on 11/13/2016 Acute renal failure is likely multifactorial related Blood loss and recent anesthesia exposure-   Plan - Patient was on hydration prior to our evaluation. BUN up to 51 with a creatinine of 2.2. Recommend replacement of Foley catheter so we can determine exact urine output. No urgent indication for dialysis at the moment however at the last admission she required temporary dialysis as well.  2. Anemia of chronic kidney disease and possibly blood loss - Hemoglobin down to 7.9. Case discussed with hospitalist. Consider blood transfusion if patient becomes symptomatic.  3. Hyperkalemia. Serum potassium slightly high at 5.2. Continue to monitor. We may need to consider use of patiromer if K continues to rise.      LOS: 3 Regina Burke 6/9/201811:41 AM  [facility] [place], [place]

## 2017-02-03 NOTE — Progress Notes (Signed)
Pt had indwelling catheters inserted tolerated well, insertion 400 ml, no complaints of pain, received a blood transfusion tolerated well.

## 2017-02-03 NOTE — Progress Notes (Signed)
[facility] [place] NAME: Regina Burke    MR#:  [id number]  DATE OF BIRTH:  1933-06-19  SUBJECTIVE:  CHIEF COMPLAINT:   Chief Complaint  Patient presents with  . Fall    - feels better today, POD #2 after left hip surgery - oliguric, No improvement noted with IV fluids at this time, hemoglobin dropped today. Will get transfusion. Patient complains of some dyspnea and weakness  REVIEW OF SYSTEMS:  Review of Systems  Constitutional: Negative for chills, fever and malaise/fatigue.  HENT: Negative for congestion, ear discharge, hearing loss, nosebleeds and sore throat.   Eyes: Negative for blurred vision and double vision.  Respiratory: Positive for shortness of breath. Negative for cough and wheezing.   Cardiovascular: Negative for chest pain and palpitations.  Gastrointestinal: Negative for abdominal pain, constipation, diarrhea, nausea and vomiting.  Genitourinary: Negative for dysuria.  Musculoskeletal: Positive for joint pain and myalgias.  Neurological: Positive for weakness. Negative for dizziness, speech change, focal weakness, seizures and headaches.  Psychiatric/Behavioral: Negative for depression.    DRUG ALLERGIES:   Allergies  Allergen Reactions  . Ciprofloxacin Shortness Of Breath, Itching and Rash  . Doxycycline Shortness Of Breath, Itching and Rash  . Penicillins Shortness Of Breath, Itching, Rash and Other (See Comments)    Has patient had a PCN reaction causing immediate rash, facial/tongue/throat swelling, SOB or lightheadedness with hypotension: Yes Has patient had a PCN reaction causing severe rash involving mucus membranes or skin necrosis: No Has patient had a PCN reaction that required hospitalization No Has patient had a PCN reaction occurring within the last 10 years: No If all of the above answers are "NO", then may proceed with Cephalosporin use.  . Sulfa Antibiotics Shortness Of Breath, Itching and  Rash  . Latex Itching  . Morphine And Related Itching  . Albuterol Sulfate   . Cefuroxime Rash    Blisters in mouth    VITALS:  Blood pressure 132/86, pulse 69, temperature 98.2 F (36.8 C), temperature source Oral, resp. rate 20, height 5\' 4"  (1.626 m), weight 81.2 kg (179 lb), SpO2 96 %.  PHYSICAL EXAMINATION:  Physical Exam  GENERAL:  83 y.o.-year-old patient lying in the bed with no acute distress.  EYES: Pupils equal, round, reactive to light and accommodation. No scleral icterus. Extraocular muscles intact.  HEENT: Head atraumatic, normocephalic. Oropharynx and nasopharynx clear.  NECK:  Supple, no jugular venous distention. No thyroid enlargement, no tenderness.  LUNGS: Normal breath sounds bilaterally, no wheezing,  rhonchi or crepitation. No use of accessory muscles of respiration. Fine bibasilar crackles CARDIOVASCULAR: S1, S2 normal. No rubs, or gallops. 2/6 systolic murmur present. ABDOMEN: Soft, nontender, nondistended. Bowel sounds present. No organomegaly or mass.  EXTREMITIES: No pedal edema, cyanosis, or clubbing. Left hip dressing in place NEUROLOGIC: Cranial nerves II through XII are intact. Muscle strength 5/5 in all extremities. Sensation intact. Gait not checked.  PSYCHIATRIC: The patient is alert and oriented x 3.  SKIN: No obvious rash, lesion, or ulcer.    LABORATORY PANEL:   CBC  Recent Labs Lab 02/03/17 0256  WBC 14.1*  HGB 7.9*  HCT 24.1*  PLT 240   ------------------------------------------------------------------------------------------------------------------  Chemistries   Recent Labs Lab 02/02/17 0420 02/03/17 0256  NA 136 135  K 4.8 5.2*  CL 104 104  CO2 24 22  GLUCOSE 127* 114*  BUN 46* 51*  CREATININE 1.85* 2.23*  CALCIUM 8.4* 8.4*  AST 21  --  ALT 12*  --   ALKPHOS 49  --   BILITOT 0.9  --    ------------------------------------------------------------------------------------------------------------------  Cardiac  Enzymes No results for input(s): TROPONINI in the last 168 hours. ------------------------------------------------------------------------------------------------------------------  RADIOLOGY:  No results found.  EKG:   Orders placed or performed during the hospital encounter of 01/31/17  . EKG 12-Lead  . EKG 12-Lead    ASSESSMENT AND PLAN:   83 y/o CAD, ischemic cardiomyopathy, CKD stage 4, COPD, afib, IDA  Admitted after a fall and left hip fracture.  #1  Left femoral neck fracture- appreciate ortho consult, POD #2 today - monitor pain mgmt,  - physical therapy and likely rehab once renal function improves  #2 Chronic afib- toprol No anti coagulation due to recent gi bleed - appreciate cardiology clearance Recent ECHO with improved EF  #3 CAD- continue home medications. On statin and metoprolol. H/o GI bleed recently- so not on asa  #4 acute on chronic anemia-postoperative. Patient received transfusion last week for GI bleed. Hemoglobin now at 7.9 with symptoms including dyspnea and tiredness and acute renal failure. -We'll transfuse 1 unit packed RBC for symptomatic anemia at this time.  #5 ARF on CKD and oliguria- stage IV at baseline CK D, renal function worsening with oliguria today. Patient during last admission last week received temporary hemodialysis. [facility] consult nephrology. -Advised Foley catheter for strict input and output monitoring at this time.  #6 Hyperkalemia- monitor at this time, if needed, will start veltasa  #7 DVT prophylaxis- lovenox   physical therapy consulted     All the records are reviewed and case discussed with Care Management/Social Workerr. Management plans discussed with the patient, family and they are in agreement.  CODE STATUS: Full Code  TOTAL TIME TAKING CARE OF THIS PATIENT: 38 minutes.   POSSIBLE D/C IN 1-2 DAYS, DEPENDING ON CLINICAL CONDITION.   Aftin Lye M.D on 02/03/2017 at 12:32 PM  Between 7am to 6pm -  Pager - [phone]  After 6pm go to [email or web address] - password [email or web address]  Office  [phone]  CC: Primary care physician; Tracie Harrier, MD

## 2017-02-03 NOTE — Progress Notes (Signed)
Pt. Hasn't voided since foley removed this morning. IVF still running at 20. Due to stop in one hour. Bladder scan showing 175 in bladder. Dr. Ara Kussmaul notified and no  Intervention at this time. Continue to monitor pt. Bladder scan amount. If pt. Scans greater than 400 do in and out cath.

## 2017-02-03 NOTE — Progress Notes (Signed)
OT Cancellation Note  Patient Details Name: Regina Burke MRN: [id number] DOB: July 02, 1933   Cancelled Treatment:    Reason Eval/Treat Not Completed: Medical issues which prohibited therapy. Chart reviewed. Pt with potassium 5.2, HCT 24.1, hemoglobin 7.9. Order for blood transfusion for symptomatic anemia. Pt refused PT treatment this am due to fatigue/lethary. Will hold OT treatment at this time and re-attempt when pt is more appropriate for therapy.  Jeni Salles, MPH, MS, OTR/L ascom [phone] 02/03/17, 1:52 PM

## 2017-02-03 NOTE — Progress Notes (Signed)
PT Cancellation Note  Patient Details Name: Regina Burke MRN: [id number] DOB: 02/26/1933   Cancelled Treatment:    Reason Eval/Treat Not Completed: Fatigue/lethargy limiting ability to participate   Attempted and encouraged participation in PM therapy session.  Pt stated "I'm just give out".  She continued to decline all mobility and supine exercises after encouragement.  "I'm just not going to do it". Will continue as appropriate.   Chesley Noon 02/03/2017, 1:27 PM

## 2017-02-03 NOTE — Progress Notes (Signed)
Subjective:  Patient reports pain as moderate.   Objective:   VITALS:   Vitals:   02/02/17 1107 02/02/17 1434 02/02/17 2331 02/03/17 0853  BP: (!) 122/56 (!) 127/47 129/80 132/86  Pulse: 70 70 71 69  Resp:  17 18 20   Temp:  98.4 F (36.9 C) 98 F (36.7 C) 98.2 F (36.8 C)  TempSrc:  Oral Oral Oral  SpO2:  93% 94% 96%  Weight:      Height:        PHYSICAL EXAM:  Sensation intact distally Dorsiflexion/Plantar flexion intact Incision: dressing C/D/I Compartment soft  LABS  Results for orders placed or performed during the hospital encounter of 01/31/17 (from the past 24 hour(s))  Glucose, capillary     Status: Abnormal   Collection Time: 02/02/17 12:17 PM  Result Value Ref Range   Glucose-Capillary 126 (H) 65 - 99 mg/dL   Comment 1 Notify RN    Comment 2 Document in Chart   Glucose, capillary     Status: Abnormal   Collection Time: 02/02/17  4:42 PM  Result Value Ref Range   Glucose-Capillary 108 (H) 65 - 99 mg/dL  Glucose, capillary     Status: Abnormal   Collection Time: 02/02/17  9:02 PM  Result Value Ref Range   Glucose-Capillary 109 (H) 65 - 99 mg/dL  Basic metabolic panel     Status: Abnormal   Collection Time: 02/03/17  2:56 AM  Result Value Ref Range   Sodium 135 135 - 145 mmol/L   Potassium 5.2 (H) 3.5 - 5.1 mmol/L   Chloride 104 101 - 111 mmol/L   CO2 22 22 - 32 mmol/L   Glucose, Bld 114 (H) 65 - 99 mg/dL   BUN 51 (H) 6 - 20 mg/dL   Creatinine, Ser 2.23 (H) 0.44 - 1.00 mg/dL   Calcium 8.4 (L) 8.9 - 10.3 mg/dL   GFR calc non Af Amer 19 (L) >60 mL/min   GFR calc Af Amer 23 (L) >60 mL/min   Anion gap 9 5 - 15  CBC     Status: Abnormal   Collection Time: 02/03/17  2:56 AM  Result Value Ref Range   WBC 14.1 (H) 3.6 - 11.0 K/uL   RBC 2.91 (L) 3.80 - 5.20 MIL/uL   Hemoglobin 7.9 (L) 12.0 - 16.0 g/dL   HCT 24.1 (L) 35.0 - 47.0 %   MCV 82.9 80.0 - 100.0 fL   MCH 27.3 26.0 - 34.0 pg   MCHC 32.9 32.0 - 36.0 g/dL   RDW 17.9 (H) 11.5 - 14.5 %    Platelets 240 150 - 440 K/uL  Glucose, capillary     Status: Abnormal   Collection Time: 02/03/17  7:47 AM  Result Value Ref Range   Glucose-Capillary 103 (H) 65 - 99 mg/dL   Comment 1 Notify RN    Comment 2 Document in Chart   Glucose, capillary     Status: Abnormal   Collection Time: 02/03/17 12:02 PM  Result Value Ref Range   Glucose-Capillary 136 (H) 65 - 99 mg/dL   Comment 1 Notify RN    Comment 2 Document in Chart     Dg Hip Operative Unilat W Or W/o Pelvis Left  Result Date: 02/01/2017 CLINICAL DATA:  Intraoperative fluoro spot images from ORIF for femoral neck fracture. Fluoro time reported is 47 seconds. EXAM: OPERATIVE left HIP (WITH PELVIS IF PERFORMED) 2 VIEWS TECHNIQUE: Fluoroscopic spot image(s) were submitted for interpretation post-operatively. COMPARISON:  Preop study  January 31, 2017 FINDINGS: The patient has undergone placement of 3 cortical screws for a subcapital fracture of the left hip. The screws are in reasonable position radiographically. IMPRESSION: Two fluoro spot images revealing the patient to of undergone ORIF for a subcapital fracture of the left hip without immediate postprocedure complication. Electronically Signed   By: David  Martinique M.D.   On: 02/01/2017 12:16    Assessment/Plan: 2 Days Post-Op   Principal Problem:   S/p left hip fracture Active Problems:   Cancer of upper lobe of right lung (HCC)   Anemia associated with chronic renal failure   Diabetes mellitus, type 2 ([place])   Coronary artery disease involving native coronary artery of native heart without angina pectoris   S/P coronary artery stent placement   Centrilobular emphysema (HCC)   Essential hypertension   Persistent atrial fibrillation (HCC)   S/P AV nodal ablation   Cardiac resynchronization therapy pacemaker (CRT-P) in place   GI bleed   Pre-operative cardiovascular examination   Up with therapy Discharge to SNF when medically stable, she is cleared from an orthopedic  standpoint She may weight bear as tolerated and follow-up with me in 10 to 14 days,  Please call with questions   Lovell Sheehan , MD 02/03/2017, 12:07 PM

## 2017-02-04 LAB — GLUCOSE, CAPILLARY
GLUCOSE-CAPILLARY: 100 mg/dL — AB (ref 65–99)
GLUCOSE-CAPILLARY: 106 mg/dL — AB (ref 65–99)
Glucose-Capillary: 95 mg/dL (ref 65–99)
Glucose-Capillary: 103 mg/dL — ABNORMAL HIGH (ref 65–99)

## 2017-02-04 LAB — BASIC METABOLIC PANEL
Anion gap: 9 (ref 5–15)
BUN: 60 mg/dL — AB (ref 6–20)
CHLORIDE: 102 mmol/L (ref 101–111)
CO2: 24 mmol/L (ref 22–32)
CREATININE: 2.57 mg/dL — AB (ref 0.44–1.00)
Calcium: 8.6 mg/dL — ABNORMAL LOW (ref 8.9–10.3)
GFR calc Af Amer: 19 mL/min — ABNORMAL LOW (ref >60)
GFR calc non Af Amer: 16 mL/min — ABNORMAL LOW (ref >60)
Glucose, Bld: 96 mg/dL (ref 65–99)
POTASSIUM: 5.1 mmol/L (ref 3.5–5.1)
SODIUM: 135 mmol/L (ref 135–145)

## 2017-02-04 LAB — CBC
HEMATOCRIT: 27 % — AB (ref 35.0–47.0)
Hemoglobin: 8.9 g/dL — ABNORMAL LOW (ref 12.0–16.0)
MCH: 27.9 pg (ref 26.0–34.0)
MCHC: 33.2 g/dL (ref 32.0–36.0)
MCV: 84.1 fL (ref 80.0–100.0)
PLATELETS: 211 10*3/uL (ref 150–440)
RBC: 3.21 MIL/uL — AB (ref 3.80–5.20)
RDW: 17.4 % — ABNORMAL HIGH (ref 11.5–14.5)
WBC: 9.7 10*3/uL (ref 3.6–11.0)

## 2017-02-04 MED ORDER — PHENOL 1.4 % MT LIQD
1.0000 | OROMUCOSAL | Status: DC | PRN
  Administered 2017-02-04 – 2017-02-05 (×2): 1 via OROMUCOSAL
  Filled 2017-02-04: qty 177

## 2017-02-04 MED ORDER — BISACODYL 10 MG RE SUPP
10.0000 mg | Freq: Once | RECTAL | Status: AC
  Administered 2017-02-04: 10 mg via RECTAL
  Filled 2017-02-04: qty 1

## 2017-02-04 NOTE — Progress Notes (Addendum)
Subjective:  Postoperative day #3 status post percutaneous fixation for left femoral hip fracture performed by Dr. Harlow Mares. Patient reports pain as mild to moderate.  Patient is sitting up in a chair. Her family is at the bedside. Patient feels tired but has no other complaints.  Foley catheter reinserted.  Patient received one unit of PRBC yesterday with improvement in hemoglobin.  Objective:   VITALS:   Vitals:   02/03/17 1730 02/03/17 2352 02/03/17 2355 02/04/17 0818  BP: (!) 142/74 (!) 82/53 (!) 102/56 (!) 134/58  Pulse: 72 70  73  Resp: 20 19  18   Temp: 98.4 F (36.9 C) 98.7 F (37.1 C)  98.2 F (36.8 C)  TempSrc: Oral Oral  Oral  SpO2: 92% 93%  96%  Weight:      Height:        PHYSICAL EXAM:  Left lower extremity:   The Aquasol dressing small amount of dried serosanguineous drainage.  Dressing was not removed. Neurovascular intact Sensation intact distally Intact pulses distally Dorsiflexion/Plantar flexion intact No cellulitis present Compartment soft  LABS  Results for orders placed or performed during the hospital encounter of 01/31/17 (from the past 24 hour(s))  Prepare RBC     Status: None   Collection Time: 02/03/17  1:00 PM  Result Value Ref Range   Order Confirmation ORDER PROCESSED BY BLOOD BANK   Glucose, capillary     Status: Abnormal   Collection Time: 02/03/17  4:20 PM  Result Value Ref Range   Glucose-Capillary 126 (H) 65 - 99 mg/dL   Comment 1 Notify RN    Comment 2 Document in Chart   Glucose, capillary     Status: Abnormal   Collection Time: 02/03/17  9:38 PM  Result Value Ref Range   Glucose-Capillary 107 (H) 65 - 99 mg/dL   Comment 1 Notify RN   Basic metabolic panel     Status: Abnormal   Collection Time: 02/04/17  3:26 AM  Result Value Ref Range   Sodium 135 135 - 145 mmol/L   Potassium 5.1 3.5 - 5.1 mmol/L   Chloride 102 101 - 111 mmol/L   CO2 24 22 - 32 mmol/L   Glucose, Bld 96 65 - 99 mg/dL   BUN 60 (H) 6 - 20 mg/dL   Creatinine, Ser 2.57 (H) 0.44 - 1.00 mg/dL   Calcium 8.6 (L) 8.9 - 10.3 mg/dL   GFR calc non Af Amer 16 (L) >60 mL/min   GFR calc Af Amer 19 (L) >60 mL/min   Anion gap 9 5 - 15  CBC     Status: Abnormal   Collection Time: 02/04/17  3:26 AM  Result Value Ref Range   WBC 9.7 3.6 - 11.0 K/uL   RBC 3.21 (L) 3.80 - 5.20 MIL/uL   Hemoglobin 8.9 (L) 12.0 - 16.0 g/dL   HCT 27.0 (L) 35.0 - 47.0 %   MCV 84.1 80.0 - 100.0 fL   MCH 27.9 26.0 - 34.0 pg   MCHC 33.2 32.0 - 36.0 g/dL   RDW 17.4 (H) 11.5 - 14.5 %   Platelets 211 150 - 440 K/uL  Glucose, capillary     Status: None   Collection Time: 02/04/17  7:18 AM  Result Value Ref Range   Glucose-Capillary 95 65 - 99 mg/dL   Comment 1 Notify RN    Comment 2 Document in Chart   Glucose, capillary     Status: Abnormal   Collection Time: 02/04/17 11:15 AM  Result  Value Ref Range   Glucose-Capillary 103 (H) 65 - 99 mg/dL   Comment 1 Notify RN    Comment 2 Document in Chart     No results found.  Assessment/Plan: 3 Days Post-Op   Principal Problem:   S/p left hip fracture Active Problems:   Cancer of upper lobe of right lung (HCC)   Anemia associated with chronic renal failure   Diabetes mellitus, type 2 (HCC)   Coronary artery disease involving native coronary artery of native heart without angina pectoris   S/P coronary artery stent placement   Centrilobular emphysema (HCC)   Essential hypertension   Persistent atrial fibrillation (HCC)   S/P AV nodal ablation   Cardiac resynchronization therapy pacemaker (CRT-P) in place   GI bleed   Pre-operative cardiovascular examination  Patient making progress postop.  Creatinine elevated compared to admission.  Hemoglobin and hematocrit have improved since transfusion yesterday.   Continue to monitor hemoglobin.  Episode of hypotension around midnight last night.  Currently vital signs are stable and she is afebrile. Continue physical therapy as patient can tolerate.  Patient on Lovenox for  DVT prophylaxis.    Thornton Park , MD 02/04/2017, 12:16 PM

## 2017-02-04 NOTE — Progress Notes (Signed)
[facility] [place] NAME: Regina Burke    MR#:  [id number]  DATE OF BIRTH:  April 21, 1933  SUBJECTIVE:  CHIEF COMPLAINT:   Chief Complaint  Patient presents with  . Fall    - feels tired, poor appetite and oral intake, POD #3 after left hip surgery -foley placed again for strict I&O monitoring - received 1unit Tx yesterday  REVIEW OF SYSTEMS:  Review of Systems  Constitutional: Negative for chills, fever and malaise/fatigue.  HENT: Negative for congestion, ear discharge, hearing loss, nosebleeds and sore throat.   Eyes: Negative for blurred vision and double vision.  Respiratory: Positive for shortness of breath. Negative for cough and wheezing.   Cardiovascular: Negative for chest pain and palpitations.  Gastrointestinal: Negative for abdominal pain, constipation, diarrhea, nausea and vomiting.  Genitourinary: Negative for dysuria.  Musculoskeletal: Positive for joint pain and myalgias.  Neurological: Positive for weakness. Negative for dizziness, speech change, focal weakness, seizures and headaches.  Psychiatric/Behavioral: Negative for depression.    DRUG ALLERGIES:   Allergies  Allergen Reactions  . Ciprofloxacin Shortness Of Breath, Itching and Rash  . Doxycycline Shortness Of Breath, Itching and Rash  . Penicillins Shortness Of Breath, Itching, Rash and Other (See Comments)    Has patient had a PCN reaction causing immediate rash, facial/tongue/throat swelling, SOB or lightheadedness with hypotension: Yes Has patient had a PCN reaction causing severe rash involving mucus membranes or skin necrosis: No Has patient had a PCN reaction that required hospitalization No Has patient had a PCN reaction occurring within the last 10 years: No If all of the above answers are "NO", then may proceed with Cephalosporin use.  . Sulfa Antibiotics Shortness Of Breath, Itching and Rash  . Latex Itching  . Morphine And Related Itching  .  Albuterol Sulfate   . Cefuroxime Rash    Blisters in mouth    VITALS:  Blood pressure (!) 134/58, pulse 73, temperature 98.2 F (36.8 C), temperature source Oral, resp. rate 18, height 5\' 4"  (1.626 m), weight 81.2 kg (179 lb), SpO2 96 %.  PHYSICAL EXAMINATION:  Physical Exam  GENERAL:  83 y.o.-year-old patient lying in the bed with no acute distress.  EYES: Pupils equal, round, reactive to light and accommodation. No scleral icterus. Extraocular muscles intact.  HEENT: Head atraumatic, normocephalic. Oropharynx and nasopharynx clear.  NECK:  Supple, no jugular venous distention. No thyroid enlargement, no tenderness.  LUNGS: Normal breath sounds bilaterally, no wheezing,  rhonchi or crepitation. No use of accessory muscles of respiration. Decreased bibasilar breath sounds CARDIOVASCULAR: S1, S2 normal. No rubs, or gallops. 2/6 systolic murmur present. ABDOMEN: Soft, nontender, nondistended. Bowel sounds present. No organomegaly or mass.  EXTREMITIES: No pedal edema, cyanosis, or clubbing. Left hip dressing in place NEUROLOGIC: Cranial nerves II through XII are intact. Muscle strength 5/5 in all extremities. Sensation intact. Gait not checked. Global weakness noted. PSYCHIATRIC: The patient is alert and oriented x 3.  SKIN: No obvious rash, lesion, or ulcer.    LABORATORY PANEL:   CBC  Recent Labs Lab 02/04/17 0326  WBC 9.7  HGB 8.9*  HCT 27.0*  PLT 211   ------------------------------------------------------------------------------------------------------------------  Chemistries   Recent Labs Lab 02/02/17 0420  02/04/17 0326  NA 136  < > 135  K 4.8  < > 5.1  CL 104  < > 102  CO2 24  < > 24  GLUCOSE 127*  < > 96  BUN 46*  < > 60*  CREATININE 1.85*  < > 2.57*  CALCIUM 8.4*  < > 8.6*  AST 21  --   --   ALT 12*  --   --   ALKPHOS 49  --   --   BILITOT 0.9  --   --   < > = values in this interval not  displayed. ------------------------------------------------------------------------------------------------------------------  Cardiac Enzymes No results for input(s): TROPONINI in the last 168 hours. ------------------------------------------------------------------------------------------------------------------  RADIOLOGY:  No results found.  EKG:   Orders placed or performed during the hospital encounter of 01/31/17  . EKG 12-Lead  . EKG 12-Lead    ASSESSMENT AND PLAN:   83 y/o CAD, ischemic cardiomyopathy, CKD stage 4, COPD, afib, IDA  Admitted after a fall and left hip fracture.  #1  Left femoral neck fracture- appreciate ortho consult, POD # 3 today - monitor pain mgmt,  - physical therapy and likely rehab once renal function improves  #2 Chronic afib- toprol No anti coagulation due to recent gi bleed - appreciate cardiology clearance Recent ECHO with improved EF  #3 CAD- continue home medications. On statin and metoprolol. H/o GI bleed recently- so not on asa  #4 acute on chronic anemia-postoperative. Patient received transfusion last week for GI bleed. Hemoglobin low yesterday with symptoms including dyspnea and tiredness and acute renal failure. - received 1 unit packed RBC for symptomatic anemia and hb at 8.9 today, monitor closely  #5 ARF on CKD and oliguria- stage IV at baseline CKD, renal function worsening.  -Patient during last admission last week received temporary hemodialysis.  - consulted nephrology. -reinserted Foley catheter for strict input and output monitoring at this time. Urine output is some better - discontinue lasix and monitor. No acute indication for dialysis yet  #6 Hyperkalemia- monitor at this time, consider veltasa  #7 DVT prophylaxis- lovenox   physical therapy consulted-Needs rehabilitation once renal function is stable    All the records are reviewed and case discussed with Care Management/Social Workerr. Management plans  discussed with the patient, family and they are in agreement.  CODE STATUS: Full Code  TOTAL TIME TAKING CARE OF THIS PATIENT: 38 minutes.   POSSIBLE D/C IN 1-2 DAYS, DEPENDING ON CLINICAL CONDITION.   Gladstone Lighter M.D on 02/04/2017 at 11:47 AM  Between 7am to 6pm - Pager - [phone]  After 6pm go to [email or web address] - password [facility]  Office  [phone]  CC: Primary care physician; Tracie Harrier, MD

## 2017-02-04 NOTE — Progress Notes (Signed)
Physical Therapy Treatment Patient Details Name: Regina Burke MRN: [id number] DOB: 11-May-1933 Today's Date: 02/04/2017    History of Present Illness Pt is an 83 y.o. female presenting to hospital s/p fall sustaining L femoral neck fx.  Pt s/p L cannulated hip pinning 02/01/17.  Pt with recent hospitalization for acute on chronic hypoxic respiratory failure, acute on chronic diastolic CHF, and acute on chronic CKD stage IV (recent temporary dialysis).  PMH includes chronic diastolic CHF, chronic COPD on 2 L home O2 via nasal cannula, h/o a-fib, chronic kidney disease stage IV, recent GI bleed, pacemaker, HOH, hiatal hernia.    PT Comments    "I'll try"  Participated in exercises as described below.  Overall improved bed mobility with decreased assist to edge of bed today.  Still requires +2 assist for squat pivot transfer to chair for safety and comfort of pt.  While WB status orders were confirmed WBAT of Friday with Dr. Marica Otter, pt still reporting she is not to put much if any weight on her leg.     Follow Up Recommendations  SNF     Equipment Recommendations       Recommendations for Other Services       Precautions / Restrictions Precautions Precautions: Fall Restrictions Weight Bearing Restrictions: Yes LLE Weight Bearing: Weight bearing as tolerated Other Position/Activity Restrictions: Called Dr. Alvira Philips office to speak with MD AM of 02/02/17 and spoke with Wells Guiles Wells Guiles contacted Peggy from Dr. Gertie Baron office who directly spoke with Dr. Marica Otter) and received verbal that pt was WBAT.    Mobility  Bed Mobility Overal bed mobility: Needs Assistance Bed Mobility: Sit to Supine     Supine to sit: Mod assist;+2 for physical assistance     General bed mobility comments: improved initiation today and assist from pt  Transfers Overall transfer level: Needs assistance Equipment used: None Transfers: Squat Pivot Transfers Sit to Stand: Mod assist;+2 physical  assistance         General transfer comment: squat pivot to right with limited WB LLE  Ambulation/Gait                 Stairs            Wheelchair Mobility    Modified Rankin (Stroke Patients Only)       Balance Overall balance assessment: Needs assistance Sitting-balance support: Bilateral upper extremity supported;Feet supported Sitting balance-Leahy Scale: Fair Sitting balance - Comments: static sitting                                    Cognition Arousal/Alertness: Awake/alert Behavior During Therapy: WFL for tasks assessed/performed Overall Cognitive Status: Within Functional Limits for tasks assessed                                        Exercises Other Exercises Other Exercises: supine AAROM x 10 for ankle pumps, quad and glut sets, SLR, ab/add, heel slides and SAQ    General Comments        Pertinent Vitals/Pain Pain Assessment: 0-10 Pain Score: 4  Pain Location: Left Hip Pain Descriptors / Indicators: Sore;Tender Pain Intervention(s): Limited activity within patient's tolerance    Home Living                      Prior  Function            PT Goals (current goals can now be found in the care plan section) Progress towards PT goals: Progressing toward goals    Frequency    BID      PT Plan Current plan remains appropriate    Co-evaluation              AM-PAC PT "6 Clicks" Daily Activity  Outcome Measure  Difficulty turning over in bed (including adjusting bedclothes, sheets and blankets)?: Total Difficulty moving from lying on back to sitting on the side of the bed? : Total Difficulty sitting down on and standing up from a chair with arms (e.g., wheelchair, bedside commode, etc,.)?: Total Help needed moving to and from a bed to chair (including a wheelchair)?: Total Help needed walking in hospital room?: Total Help needed climbing 3-5 steps with a railing? : Total 6 Click  Score: 6    End of Session Equipment Utilized During Treatment: Gait belt;Oxygen Activity Tolerance: Patient tolerated treatment well Patient left: with call bell/phone within reach;in chair;with chair alarm set Nurse Communication: Mobility status Pain - Right/Left: Left Pain - part of body: Hip     Time: 8250-0370 PT Time Calculation (min) (ACUTE ONLY): 14 min  Charges:  $Therapeutic Exercise: 8-22 mins                    G Codes:       Chesley Noon, PTA 02/04/17, 12:29 PM

## 2017-02-04 NOTE — Plan of Care (Signed)
Problem: Safety: Goal: Ability to remain free from injury will improve Outcome: Progressing Bed in lowest in position call bed within reach and bed alarm on

## 2017-02-04 NOTE — Progress Notes (Signed)
[facility], [place] 02/04/17  Subjective:  BUN slightly up to 60 and creatinine currently 2.57. Patient sitting up in chair this a.m. Hip pain is tolerable.  Objective:  Vital signs in last 24 hours:  Temp:  [98.2 F (36.8 C)-98.7 F (37.1 C)] 98.2 F (36.8 C) (06/10 0818) Pulse Rate:  [70-74] 73 (06/10 0818) Resp:  [18-20] 18 (06/10 0818) BP: (82-142)/(43-92) 134/58 (06/10 0818) SpO2:  [92 %-97 %] 96 % (06/10 0818)  Weight change:  Filed Weights   01/31/17 1041 02/01/17 0929  Weight: 81.2 kg (179 lb) 81.2 kg (179 lb)    Intake/Output:    Intake/Output Summary (Last 24 hours) at 02/04/17 1234 Last data filed at 02/04/17 1046  Gross per 24 hour  Intake            531.2 ml  Output              585 ml  Net            -53.8 ml     Physical Exam: General: Sitting up in chair, no acute distress  HEENT Moist oral mucous membranes   Neck Supple   Pulm/lungs Clear bilaterally, normal effort  CVS/Heart irregular, no rub    Abdomen:  Soft,  Nondistended, BS present  Extremities: + peripheral edema   Neurologic: Alert, oriented, follows commands  Skin: No acute rashes           Basic Metabolic Panel:   Recent Labs Lab 01/31/17 1147 02/01/17 0522 02/02/17 0420 02/03/17 0256 02/04/17 0326  NA 140 137 136 135 135  K 5.1 4.8 4.8 5.2* 5.1  CL 102 103 104 104 102  CO2 27 24 24 22 24   GLUCOSE 116* 106* 127* 114* 96  BUN 53* 50* 46* 51* 60*  CREATININE 1.75* 1.85* 1.85* 2.23* 2.57*  CALCIUM 9.0 8.5* 8.4* 8.4* 8.6*     CBC:  Recent Labs Lab 01/31/17 1147 02/01/17 0522 02/03/17 0256 02/04/17 0326  WBC 10.6 8.6 14.1* 9.7  NEUTROABS 9.5*  --   --   --   HGB 9.5* 8.9* 7.9* 8.9*  HCT 29.0* 26.9* 24.1* 27.0*  MCV 82.5 82.3 82.9 84.1  PLT 253 221 240 211      Lab Results  Component Value Date   HEPBSAG Negative 01/26/2017   HEPBSAB Non Reactive 01/26/2017      Microbiology:  Recent Results (from the past 240 hour(s))   MRSA PCR Screening     Status: None   Collection Time: 01/27/17  9:58 AM  Result Value Ref Range Status   MRSA by PCR NEGATIVE NEGATIVE Final    Comment:        The GeneXpert MRSA Assay (FDA approved for NASAL specimens only), is one component of a comprehensive MRSA colonization surveillance program. It is not intended to diagnose MRSA infection nor to guide or monitor treatment for MRSA infections.   Surgical pcr screen     Status: None   Collection Time: 01/31/17  6:50 PM  Result Value Ref Range Status   MRSA, PCR NEGATIVE NEGATIVE Final   Staphylococcus aureus NEGATIVE NEGATIVE Final    Comment:        The Xpert SA Assay (FDA approved for NASAL specimens in patients over 51 years of age), is one component of a comprehensive surveillance program.  Test performance has been validated by [facility] for patients greater than or equal to 76 year old. It is not intended to diagnose infection  nor to guide or monitor treatment.     Coagulation Studies: No results for input(s): LABPROT, INR in the last 72 hours.  Urinalysis: No results for input(s): COLORURINE, LABSPEC, PHURINE, GLUCOSEU, HGBUR, BILIRUBINUR, KETONESUR, PROTEINUR, UROBILINOGEN, NITRITE, LEUKOCYTESUR in the last 72 hours.  Invalid input(s): APPERANCEUR    Imaging: No results found.   Medications:   . lactated ringers 50 mL/hr at 02/01/17 1514   . ALPRAZolam  0.5 mg Oral QHS  . atorvastatin  20 mg Oral QPM  . bisacodyl  10 mg Rectal Once  . cholecalciferol  400 Units Oral Daily  . enoxaparin (LOVENOX) injection  30 mg Subcutaneous Q24H  . fenofibrate  54 mg Oral Daily  . ferrous sulfate  325 mg Oral Q breakfast  . fluticasone furoate-vilanterol  1 puff Inhalation Daily  . insulin aspart  0-5 Units Subcutaneous QHS  . insulin aspart  0-9 Units Subcutaneous TID WC  . metoprolol succinate  12.5 mg Oral Daily  . multivitamin-lutein  2 capsule Oral Daily  . pantoprazole  40 mg Oral QPM  .  sertraline  50 mg Oral Daily  . tiotropium  18 mcg Inhalation Daily  . cyanocobalamin  500 mcg Oral Daily   acetaminophen **OR** acetaminophen, albuterol, fentaNYL (SUBLIMAZE) injection, nitroGLYCERIN, ondansetron **OR** ondansetron (ZOFRAN) IV, phenol, polyethylene glycol, traMADol  Assessment/ Plan:  83 y.o.caucasian female with diabetes mellitus type II, coronary artery disease, hypertension, hyperlipidemia, generalized anxiety disorder, gout, anemia, history of lung cancer, COPD, atrial fibrillation, biventricular ICD, fatty liver disease, GERD, osteoporosis   1. ARF on CKD st 4 Baseline creatinine of 1.85. Acute renal failure is likely multifactorial related Blood loss and recent anesthesia exposure-   Plan - Renal function worsening again. Creatinine up to 2.57. Suspect that this is related to recent surgery as above. Patient had recent hospitalization that required temporary hemodialysis. No urgent indication for dialysis at the moment. Continue to monitor renal function daily.   2. Anemia of chronic kidney disease and possibly blood loss - Hemoglobin up to 8.9 post blood transfusion. Continue to monitor CBC.  3. Hyperkalemia. Potassium down to 5.1 today. Continue to monitor.      LOS: [place] 6/10/201812:34 PM  [facility] [place], [place]

## 2017-02-05 ENCOUNTER — Ambulatory Visit: Payer: Medicare Other

## 2017-02-05 ENCOUNTER — Other Ambulatory Visit: Payer: Medicare Other

## 2017-02-05 LAB — BASIC METABOLIC PANEL
ANION GAP: 8 (ref 5–15)
BUN: 67 mg/dL — ABNORMAL HIGH (ref 6–20)
CHLORIDE: 104 mmol/L (ref 101–111)
CO2: 24 mmol/L (ref 22–32)
Calcium: 8.7 mg/dL — ABNORMAL LOW (ref 8.9–10.3)
Creatinine, Ser: 2.53 mg/dL — ABNORMAL HIGH (ref 0.44–1.00)
GFR calc Af Amer: 19 mL/min — ABNORMAL LOW (ref >60)
GFR calc non Af Amer: 17 mL/min — ABNORMAL LOW (ref >60)
Glucose, Bld: 106 mg/dL — ABNORMAL HIGH (ref 65–99)
Potassium: 5.2 mmol/L — ABNORMAL HIGH (ref 3.5–5.1)
SODIUM: 136 mmol/L (ref 135–145)

## 2017-02-05 LAB — GLUCOSE, CAPILLARY
GLUCOSE-CAPILLARY: 111 mg/dL — AB (ref 65–99)
Glucose-Capillary: 92 mg/dL (ref 65–99)
Glucose-Capillary: 137 mg/dL — ABNORMAL HIGH (ref 65–99)

## 2017-02-05 MED ORDER — FUROSEMIDE 40 MG PO TABS
40.0000 mg | ORAL_TABLET | Freq: Every day | ORAL | Status: DC
  Administered 2017-02-05: 40 mg via ORAL
  Filled 2017-02-05: qty 1

## 2017-02-05 MED ORDER — BOOST PLUS PO LIQD
237.0000 mL | Freq: Two times a day (BID) | ORAL | Status: DC
  Administered 2017-02-05: 237 mL via ORAL
  Filled 2017-02-05 (×2): qty 237

## 2017-02-05 NOTE — Progress Notes (Signed)
[facility]  ROUNDING NOTE   Subjective:   Son and granddaughter at bedside. Patient depressed.   K 5.2  Creatinine 2.53    Objective:  Vital signs in last 24 hours:  Temp:  [97.8 F (36.6 C)-98.9 F (37.2 C)] 98.9 F (37.2 C) (06/11 0814) Pulse Rate:  [69-72] 70 (06/11 0814) Resp:  [18-19] 18 (06/11 0814) BP: (117-146)/(56-76) 146/56 (06/11 0814) SpO2:  [95 %-99 %] 95 % (06/11 0814)  Weight change:  Filed Weights   01/31/17 1041 02/01/17 0929  Weight: 81.2 kg (179 lb) 81.2 kg (179 lb)    Intake/Output: I/O last 3 completed shifts: In: 840 [P.O.:840] Out: 375 [Urine:375]   Intake/Output this shift:  Total I/O In: 240 [P.O.:240] Out: -   Physical Exam: General: NAD,   Head: Normocephalic, atraumatic. Moist oral mucosal membranes  Eyes: Anicteric, PERRL  Neck: Supple, trachea midline  Lungs:  Clear to auscultation  Heart: Regular rate and rhythm  Abdomen:  Soft, nontender,   Extremities:  1+ peripheral edema.  Neurologic: Nonfocal, moving all four extremities  Skin: No lesions  GU Foley with clear yellow urine    Basic Metabolic Panel:  Recent Labs Lab 02/01/17 0522 02/02/17 0420 02/03/17 0256 02/04/17 0326 02/05/17 0414  NA 137 136 135 135 136  K 4.8 4.8 5.2* 5.1 5.2*  CL 103 104 104 102 104  CO2 24 24 22 24 24   GLUCOSE 106* 127* 114* 96 106*  BUN 50* 46* 51* 60* 67*  CREATININE 1.85* 1.85* 2.23* 2.57* 2.53*  CALCIUM 8.5* 8.4* 8.4* 8.6* 8.7*    Liver Function Tests:  Recent Labs Lab 01/31/17 1147 02/01/17 0522 02/02/17 0420  AST 26 21 21   ALT 13* 12* 12*  ALKPHOS 51 47 49  BILITOT 1.2 1.3* 0.9  PROT 6.7 6.6 6.5  ALBUMIN 3.2* 3.1* 3.0*   No results for input(s): LIPASE, AMYLASE in the last 168 hours. No results for input(s): AMMONIA in the last 168 hours.  CBC:  Recent Labs Lab 01/31/17 1147 02/01/17 0522 02/03/17 0256 02/04/17 0326  WBC 10.6 8.6 14.1* 9.7  NEUTROABS 9.5*  --   --   --   HGB 9.5* 8.9* 7.9*  8.9*  HCT 29.0* 26.9* 24.1* 27.0*  MCV 82.5 82.3 82.9 84.1  PLT 253 221 240 211    Cardiac Enzymes: No results for input(s): CKTOTAL, CKMB, CKMBINDEX, TROPONINI in the last 168 hours.  BNP: Invalid input(s): POCBNP  CBG:  Recent Labs Lab 02/04/17 1115 02/04/17 1646 02/04/17 2107 02/05/17 0759 02/05/17 1142  GLUCAP 103* 100* 106* 43 137*    Microbiology: Results for orders placed or performed during the hospital encounter of 01/31/17  Surgical pcr screen     Status: None   Collection Time: 01/31/17  6:50 PM  Result Value Ref Range Status   MRSA, PCR NEGATIVE NEGATIVE Final   Staphylococcus aureus NEGATIVE NEGATIVE Final    Comment:        The Xpert SA Assay (FDA approved for NASAL specimens in patients over 64 years of age), is one component of a comprehensive surveillance program.  Test performance has been validated by [facility] for patients greater than or equal to 4 year old. It is not intended to diagnose infection nor to guide or monitor treatment.     Coagulation Studies: No results for input(s): LABPROT, INR in the last 72 hours.  Urinalysis: No results for input(s): COLORURINE, LABSPEC, PHURINE, GLUCOSEU, HGBUR, BILIRUBINUR, KETONESUR, PROTEINUR, UROBILINOGEN, NITRITE, LEUKOCYTESUR in the  last 72 hours.  Invalid input(s): APPERANCEUR    Imaging: No results found.   Medications:    . ALPRAZolam  0.5 mg Oral QHS  . atorvastatin  20 mg Oral QPM  . cholecalciferol  400 Units Oral Daily  . enoxaparin (LOVENOX) injection  30 mg Subcutaneous Q24H  . fenofibrate  54 mg Oral Daily  . ferrous sulfate  325 mg Oral Q breakfast  . fluticasone furoate-vilanterol  1 puff Inhalation Daily  . furosemide  40 mg Oral Daily  . insulin aspart  0-5 Units Subcutaneous QHS  . insulin aspart  0-9 Units Subcutaneous TID WC  . lactose free nutrition  237 mL Oral BID  . metoprolol succinate  12.5 mg Oral Daily  . multivitamin-lutein  2 capsule Oral Daily  .  pantoprazole  40 mg Oral QPM  . sertraline  50 mg Oral Daily  . tiotropium  18 mcg Inhalation Daily  . cyanocobalamin  500 mcg Oral Daily   acetaminophen **OR** acetaminophen, albuterol, nitroGLYCERIN, ondansetron **OR** ondansetron (ZOFRAN) IV, phenol, polyethylene glycol, traMADol  Assessment/ Plan:  [name] is a 83 y.o. white female with diabetes mellitus type II, coronary artery disease, hypertension, hyperlipidemia, generalized anxiety disorder, gout, anemia, history of lung cancer, COPD, atrial fibrillation, biventricular ICD, fatty liver disease, GERD, osteoporosis   1. Acute renal failure on chronic kidney disease stage IV with hyperkalemia: Baseline creatinine of 2.06, GFR of 22.  Acute renal failure from acute blood loss, hypotension and anesthesia - Creatinine improved and back to baseline.  - no indication for dialysis at this time.  - Low potassium diet  2. Hypertension with peripheral edema. History of pericardial effusion and diastolic congestive heart failure - restart furosemide 40mg  PO daily   3. Anemia of chronic kidney disease with recent acute blood loss: hemoglobin 8.9. Status post PRBC 6/9   LOS: [place], [place] 6/11/20181:17 PM

## 2017-02-05 NOTE — Progress Notes (Signed)
Pts foley removed @1330 . Pt has not voided.Pts packet ready for d/c. MD Posey Pronto notified. Per MD Posey Pronto pt can d/c without voiding. [facility] notified and made aware of the above.

## 2017-02-05 NOTE — Progress Notes (Signed)
Patient is medically stable for D/C to [facility] today. Per [facility] liaison patient can come today to room 611. RN will call report to RN Yaakov Guthrie at [phone] and arrange EMS for transport. Clinical Education officer, museum (CSW) sent D/C orders to [facility] via [facility]. Patient is aware of above. CSW contacted patient's sons [name] and and [name] and made them aware of above. Please reconsult if future social work needs arise. CSW signing off.   [employer], LCSW [phone]

## 2017-02-05 NOTE — Clinical Social Work Placement (Addendum)
   CLINICAL SOCIAL WORK PLACEMENT  NOTE  Date:  02/05/2017  Patient Details  Name: Regina Burke MRN: [id number] Date of Birth: 12-26-1932  Clinical Social Work is seeking post-discharge placement for this patient at the [place] level of care (*CSW will initial, date and re-position this form in  chart as items are completed):  Yes   Patient/family provided with [place] Work Department's list of facilities offering this level of care within the geographic area requested by the patient (or if unable, by the patient's family).  Yes   Patient/family informed of their freedom to choose among providers that offer the needed level of care, that participate in Medicare, Medicaid or managed care program needed by the patient, have an available bed and are willing to accept the patient.  Yes   Patient/family informed of [facility]'s ownership interest in [facility] and [facility], as well as of the fact that they are under no obligation to receive care at these facilities.  PASRR submitted to EDS on       PASRR number received on       Existing PASRR number confirmed on 02/01/17     FL2 transmitted to all facilities in geographic area requested by pt/family on 02/01/17     FL2 transmitted to all facilities within larger geographic area on       Patient informed that his/her managed care company has contracts with or will negotiate with certain facilities, including the following:        Yes   Patient/family informed of bed offers received.  Patient chooses bed at  ([facility] )     Physician recommends and patient chooses bed at      Patient to be transferred to  ([facility] ) on 02/05/17.  Patient to be transferred to facility by  [facility] )     Patient family notified on 02/05/17 of transfer.  Name of family member notified:   (Patient's son [name] is aware of D/C today. ) Patient's son [name] is also aware of D/C today.      PHYSICIAN       Additional Comment:    _______________________________________________ Murry Khiev, Veronia Beets, LCSW 02/05/2017, 3:07 PM

## 2017-02-05 NOTE — Progress Notes (Signed)
PT Cancellation Note  Patient Details Name: Regina Burke MRN: [id number] DOB: [birth year]   Cancelled Treatment:    Reason Eval/Treat Not Completed: Pain limiting ability to participate;Other (comment). Treatment attempted twice this morning. Initial attempt, pt notes she is nauseated this morning and has been unable to eat. Second attempt, later in the morning, pt notes nausea is a little better, but not resolved and pt is in moderate amount of pain. Pt request to hold this morning and check back this afternoon.    Larae Grooms, PTA 02/05/2017, 12:07 PM

## 2017-02-05 NOTE — Progress Notes (Signed)
EMS here to transport pt. 

## 2017-02-05 NOTE — Progress Notes (Signed)
OT Cancellation Note  Patient Details Name: Regina Burke MRN: [id number] DOB: 01-Sep-1932   Cancelled Treatment:    Reason Eval/Treat Not Completed: Other (comment). OT treatment attempted. Pt in process of discharging to [facility] this afternoon. Will hold OT at this time and will attempt to treat tomorrow if there is a change in pt status/she does not discharge today.  Jeni Salles, MPH, MS, OTR/L ascom [phone] 02/05/17, 3:34 PM

## 2017-02-05 NOTE — Discharge Summary (Signed)
[facility] NAME: Regina Burke    MR#:  [id number]  DATE OF BIRTH:  07-02-1933  DATE OF ADMISSION:  01/31/2017 ADMITTING PHYSICIAN: Nicholes Mango, MD  DATE OF DISCHARGE: 02/05/17  PRIMARY CARE PHYSICIAN: Tracie Harrier, MD    ADMISSION DIAGNOSIS:  Hip fracture, left, closed, initial encounter ([place]) [S72.002A]  DISCHARGE DIAGNOSIS:  Left femoral neck fracture s/p surgery POd #3 CKD -IV Post op anemia acute on chronic s/p 1 unit BT SECONDARY DIAGNOSIS:   Past Medical History:  Diagnosis Date  . Anemia   . Arthritis   . Asthma   . Cardiac resynchronization therapy pacemaker (CRT-P) in place    a. 03/31/16:  Medtronic Percepta Quad CRT-P MRI SureScan (serial Number [id number]) device.  . Chronic diastolic CHF (congestive heart failure) ([place])    a. 10/2015 Echo: EF 55-65%, Gr1 DD, mild MR, mildly dil LA, nl RV fxn, nl PASP;  b. 03/2016 Echo: EF 30-35%, mod MR, mod dil LA; c. 12/2016 Echo: EF 60-65%, no rwma, mild AS, mildly dil LA, PASP 88mmHg.  . CKD (chronic kidney disease), stage IV ([place])   . COPD (chronic obstructive pulmonary disease) ([place])   . Coronary artery disease    a. 11/2014 NSTEMI/PCI: LM nl, LAD 35m, D1 30, LCX mild dzs, OM1 20p, OM2 34m, OM3 90p (2.25x8 Promus Premier DES), RCA nl.   . Cough    CHRONIC AT NIGHT  . Cushing's disease ([place])   . Depression   . Diverticulitis   . Edema    FEET/LEGS  . GERD (gastroesophageal reflux disease)   . Gout   . History of hiatal hernia   . History of pneumonia   . HLD (hyperlipidemia)   . HOH (hard of hearing)   . Hypertensive heart disease   . Lung cancer ([place]) dx'd 2014   S/P radiation 2015  . Migraine   . Mixed Ischemic and Nonischemic Cardiomyopathy ([place])    a. 03/2016 Echo: EF 30-35%;  b. 12/2016 Echo: EF 60-65%.  . Moderate mitral regurgitation   . Multiple allergies   . Myocardial infarction ([place])   . Oxygen deficiency    2LITERS  . Persistent atrial  fibrillation ([place])    a. CHADS2VASc ==> 7 (CHF, HTN, age x 2, DM, vascular disease, and gender)--was on renal dosed eliquis but this was d/c'd in 12/2016 in setting of dark stools/anemia;  c. 03/2016 s/p AVN and MDT BiV ICD placement.  . Presence of permanent cardiac pacemaker   . S/P AV nodal ablation    a. on 03/31/16 for persistant afib with CRT-P placement  . Sleep apnea   . Type II diabetes mellitus ([place])     HOSPITAL COURSE:   83 y/o CAD, ischemic cardiomyopathy, CKD stage 4, COPD, afib, IDA  Admitted after a fall and left hip fracture.  #1 Left femoral neck fracture- appreciate ortho consult, POD # 4 today - monitor pain mgmt - physical therapy and cont rehab  #2 Chronic afib- toprol No anti coagulation due to recent gi bleed - appreciate cardiology clearance Recent ECHO with improved EF  #3 CAD- continue home medications. On statin and metoprolol. H/o GI bleed recently- so not on asa  #4 acute on chronic anemia-postoperative. Patient received transfusion last week for GI bleed. Hemoglobin low yesterday with symptoms including dyspnea and tiredness and acute renal failure. - received 1 unit packed RBC for symptomatic anemia and hb at 8.9 today, monitor closely  #5  ARF on CKD and oliguria- stage IV at baseline CKD, renal function worsening.  -Patient during last admission last week received temporary hemodialysis.  - consulted nephrology. -UOP reasonable - No acute indication for dialysis yet per dr Juleen China -lasix resumed at 40 mg daily  #6 Hyperkalemia- monitor at this time K 5.2  #7 DVT prophylaxis- lovenox  ok from nephrology to d/c. CONSULTS OBTAINED:  Treatment Team:  Lovell Sheehan, MD Anthonette Legato, MD Fritzi Mandes, MD  DRUG ALLERGIES:   Allergies  Allergen Reactions  . Ciprofloxacin Shortness Of Breath, Itching and Rash  . Doxycycline Shortness Of Breath, Itching and Rash  . Penicillins Shortness Of Breath, Itching, Rash and Other (See  Comments)    Has patient had a PCN reaction causing immediate rash, facial/tongue/throat swelling, SOB or lightheadedness with hypotension: Yes Has patient had a PCN reaction causing severe rash involving mucus membranes or skin necrosis: No Has patient had a PCN reaction that required hospitalization No Has patient had a PCN reaction occurring within the last 10 years: No If all of the above answers are "NO", then may proceed with Cephalosporin use.  . Sulfa Antibiotics Shortness Of Breath, Itching and Rash  . Latex Itching  . Morphine And Related Itching  . Albuterol Sulfate   . Cefuroxime Rash    Blisters in mouth    DISCHARGE MEDICATIONS:   Current Discharge Medication List    CONTINUE these medications which have NOT CHANGED   Details  ALPRAZolam (XANAX) 0.5 MG tablet Take 0.5 mg by mouth at bedtime.    atorvastatin (LIPITOR) 20 MG tablet Take 1 tablet (20 mg total) by mouth every evening. Qty: 90 tablet, Refills: 3    cholecalciferol (VITAMIN D) 400 UNITS TABS tablet Take 400 Units by mouth daily.     cyanocobalamin 500 MCG tablet Take 500 mcg by mouth daily.     fenofibrate (TRICOR) 48 MG tablet Take 48 mg by mouth every evening.     ferrous sulfate 325 (65 FE) MG EC tablet Take 325 mg by mouth daily with breakfast.     fluticasone furoate-vilanterol (BREO ELLIPTA) 100-25 MCG/INH AEPB Inhale 1 puff into the lungs daily as needed.     furosemide (LASIX) 40 MG tablet Take 1 tablet (40 mg total) by mouth daily. Qty: 30 tablet, Refills: 6    metoprolol succinate (TOPROL XL) 25 MG 24 hr tablet Take 0.5 tablets (12.5 mg total) by mouth daily.    Multiple Vitamins-Minerals (PRESERVISION AREDS 2) CAPS Take 1 capsule by mouth daily.    nitroGLYCERIN (NITROSTAT) 0.4 MG SL tablet Place 1 tablet (0.4 mg total) under the tongue every 5 (five) minutes as needed for chest pain. Qty: 25 tablet, Refills: 3    pantoprazole (PROTONIX) 40 MG tablet Take 40 mg by mouth every evening.      sertraline (ZOLOFT) 50 MG tablet Take 1 tablet (50 mg total) by mouth daily. Qty: 30 tablet, Refills: 2    tiotropium (SPIRIVA) 18 MCG inhalation capsule Place 18 mcg into inhaler and inhale daily as needed.     traMADol (ULTRAM) 50 MG tablet Take 50 mg by mouth every 6 (six) hours as needed.        If you experience worsening of your admission symptoms, develop shortness of breath, life threatening emergency, suicidal or homicidal thoughts you must seek medical attention immediately by calling 911 or calling your MD immediately  if symptoms less severe.  You Must read complete instructions/literature along with all the possible  adverse reactions/side effects for all the Medicines you take and that have been prescribed to you. Take any new Medicines after you have completely understood and accept all the possible adverse reactions/side effects.   Please note  You were cared for by a hospitalist during your hospital stay. If you have any questions about your discharge medications or the care you received while you were in the hospital after you are discharged, you can call the unit and asked to speak with the hospitalist on call if the hospitalist that took care of you is not available. Once you are discharged, your primary care physician will handle any further medical issues. Please note that NO REFILLS for any discharge medications will be authorized once you are discharged, as it is imperative that you return to your primary care physician (or establish a relationship with a primary care physician if you do not have one) for your aftercare needs so that they can reassess your need for medications and monitor your lab values. Today   SUBJECTIVE   No new complaints  VITAL SIGNS:  Blood pressure (!) 146/56, pulse 70, temperature 98.9 F (37.2 C), temperature source Oral, resp. rate 18, height 5\' 4"  (1.626 m), weight 81.2 kg (179 lb), SpO2 95 %.  I/O:   Intake/Output Summary (Last 24  hours) at 02/05/17 1227 Last data filed at 02/05/17 1013  Gross per 24 hour  Intake              960 ml  Output              150 ml  Net              810 ml    PHYSICAL EXAMINATION:  GENERAL:  83 y.o.-year-old patient lying in the bed with no acute distress. obese EYES: Pupils equal, round, reactive to light and accommodation. No scleral icterus. Extraocular muscles intact.  HEENT: Head atraumatic, normocephalic. Oropharynx and nasopharynx clear.  NECK:  Supple, no jugular venous distention. No thyroid enlargement, no tenderness.  LUNGS: Normal breath sounds bilaterally, no wheezing, rales,rhonchi or crepitation. No use of accessory muscles of respiration.  CARDIOVASCULAR: S1, S2 normal. No murmurs, rubs, or gallops.  ABDOMEN: Soft, non-tender, non-distended. Bowel sounds present. No organomegaly or mass.  EXTREMITIES: + pedal edema, no cyanosis, or clubbing.  NEUROLOGIC: Cranial nerves II through XII are intact. Muscle strength 5/5 in all extremities. Sensation intact. Gait not checked.  PSYCHIATRIC: The patient is alert and oriented x 3.  SKIN: No obvious rash, lesion, or ulcer.   DATA REVIEW:   CBC   Recent Labs Lab 02/04/17 0326  WBC 9.7  HGB 8.9*  HCT 27.0*  PLT 211    Chemistries   Recent Labs Lab 02/02/17 0420  02/05/17 0414  NA 136  < > 136  K 4.8  < > 5.2*  CL 104  < > 104  CO2 24  < > 24  GLUCOSE 127*  < > 106*  BUN 46*  < > 67*  CREATININE 1.85*  < > 2.53*  CALCIUM 8.4*  < > 8.7*  AST 21  --   --   ALT 12*  --   --   ALKPHOS 49  --   --   BILITOT 0.9  --   --   < > = values in this interval not displayed.  Microbiology Results   Recent Results (from the past 240 hour(s))  MRSA PCR Screening     Status: None  Collection Time: 01/27/17  9:58 AM  Result Value Ref Range Status   MRSA by PCR NEGATIVE NEGATIVE Final    Comment:        The GeneXpert MRSA Assay (FDA approved for NASAL specimens only), is one component of a comprehensive MRSA  colonization surveillance program. It is not intended to diagnose MRSA infection nor to guide or monitor treatment for MRSA infections.   Surgical pcr screen     Status: None   Collection Time: 01/31/17  6:50 PM  Result Value Ref Range Status   MRSA, PCR NEGATIVE NEGATIVE Final   Staphylococcus aureus NEGATIVE NEGATIVE Final    Comment:        The Xpert SA Assay (FDA approved for NASAL specimens in patients over 75 years of age), is one component of a comprehensive surveillance program.  Test performance has been validated by [facility] for patients greater than or equal to 4 year old. It is not intended to diagnose infection nor to guide or monitor treatment.     RADIOLOGY:  No results found.   Management plans discussed with the patient, family and they are in agreement.  CODE STATUS:     Code Status Orders        Start     Ordered   01/31/17 1524  Full code  Continuous     01/31/17 1523    Code Status History    Date Active Date Inactive Code Status Order ID Comments User Context   01/23/2017  4:48 PM 01/30/2017  6:31 PM DNR [id number]  Fritzi Mandes, MD ED   01/12/2017 11:14 AM 01/13/2017  6:36 PM Full Code [id number]  Epifanio Lesches, MD ED   03/30/2016 11:52 AM 04/03/2016  4:11 PM DNR [id number]  Erma Heritage, [facility]   03/16/2016  4:10 PM 03/24/2016  7:06 PM DNR [id number]  Awilda Bill, NP ED   03/08/2016  3:43 PM 03/14/2016  7:40 PM Partial Code [id number]  Knox Royalty, NP Inpatient   03/04/2016 10:28 PM 03/08/2016  3:43 PM Full Code [id number]  Holley Raring, NP ED   01/19/2016  7:56 PM 01/27/2016  6:01 PM Partial Code [id number]  Vaughan Basta, MD ED   11/26/2015  4:02 AM 11/26/2015  8:59 PM Full Code [id number]  Silver Huguenin, RN Inpatient   06/14/2015  6:57 PM 06/15/2015  8:54 PM DNR [id number]  Colleen Can, MD Inpatient   06/07/2015 10:20 PM 06/14/2015  6:56 PM Full Code [id number]  Hower, Aaron Mose, MD ED   05/15/2015  3:27 PM  05/24/2015  5:04 PM Full Code [id number]  [facility], MD ED   05/08/2015 11:23 AM 05/11/2015  3:13 PM Full Code [id number]  Dustin Flock, MD ED   04/15/2015 12:01 AM 04/15/2015  7:03 PM Full Code [id number]  Lance Coon, MD Inpatient   12/23/2014  1:09 PM 12/24/2014  5:17 PM Full Code [id number]  Burnell Blanks, MD Inpatient   12/23/2014  4:22 AM 12/23/2014  1:09 PM Full Code [id number]  Alwyn Pea, MD ED      TOTAL TIME TAKING CARE OF THIS PATIENT: *40* minutes.    Drucilla Cumber M.D on 02/05/2017 at 12:27 PM  Between 7am to 6pm - Pager - [phone] After 6pm go to [email or web address] - password [email or web address]  Office  [phone]  CC: Primary care physician; Tracie Harrier, MD

## 2017-02-05 NOTE — Progress Notes (Signed)
Report called to [place] [facility]. EMS called for transportation.

## 2017-02-06 LAB — TYPE AND SCREEN
ABO/RH(D): A POS
Antibody Screen: NEGATIVE
Unit division: 0

## 2017-02-06 LAB — BPAM RBC
Blood Product Expiration Date: 201806202359
ISSUE DATE / TIME: 201806091438
Unit Type and Rh: 6200

## 2017-02-19 ENCOUNTER — Ambulatory Visit: Payer: Medicare Other

## 2017-02-19 ENCOUNTER — Other Ambulatory Visit: Payer: Medicare Other

## 2017-02-26 ENCOUNTER — Telehealth: Payer: Self-pay | Admitting: Cardiovascular Disease

## 2017-02-26 NOTE — Telephone Encounter (Signed)
Pt was not given instructions at hospital discharge as to when she should resume her Eliquis. She would like to know if is safe to resume.

## 2017-02-26 NOTE — Telephone Encounter (Signed)
Pt son states Dr. Rockey Situ stopped pt Eliquis. States her internal bleeding has been resolved, and he asks if it is ok she restart this medication. Please call and advise.

## 2017-02-27 ENCOUNTER — Emergency Department: Payer: Medicare Other

## 2017-02-27 ENCOUNTER — Inpatient Hospital Stay
Admission: EM | Admit: 2017-02-27 | Discharge: 2017-03-02 | DRG: 291 | Disposition: A | Discharge status: Hospice | Payer: Medicare Other | Attending: Internal Medicine | Admitting: Internal Medicine

## 2017-02-27 DIAGNOSIS — Z9842 Cataract extraction status, left eye: Secondary | ICD-10-CM

## 2017-02-27 DIAGNOSIS — K219 Gastro-esophageal reflux disease without esophagitis: Secondary | ICD-10-CM | POA: Diagnosis present

## 2017-02-27 DIAGNOSIS — G4730 Sleep apnea, unspecified: Secondary | ICD-10-CM | POA: Diagnosis present

## 2017-02-27 DIAGNOSIS — J440 Chronic obstructive pulmonary disease with acute lower respiratory infection: Secondary | ICD-10-CM | POA: Diagnosis present

## 2017-02-27 DIAGNOSIS — F329 Major depressive disorder, single episode, unspecified: Secondary | ICD-10-CM | POA: Diagnosis present

## 2017-02-27 DIAGNOSIS — R0602 Shortness of breath: Secondary | ICD-10-CM | POA: Diagnosis present

## 2017-02-27 DIAGNOSIS — Z8261 Family history of arthritis: Secondary | ICD-10-CM

## 2017-02-27 DIAGNOSIS — I2510 Atherosclerotic heart disease of native coronary artery without angina pectoris: Secondary | ICD-10-CM | POA: Diagnosis present

## 2017-02-27 DIAGNOSIS — Z66 Do not resuscitate: Secondary | ICD-10-CM | POA: Diagnosis present

## 2017-02-27 DIAGNOSIS — J441 Chronic obstructive pulmonary disease with (acute) exacerbation: Secondary | ICD-10-CM | POA: Diagnosis present

## 2017-02-27 DIAGNOSIS — I252 Old myocardial infarction: Secondary | ICD-10-CM

## 2017-02-27 DIAGNOSIS — E1122 Type 2 diabetes mellitus with diabetic chronic kidney disease: Secondary | ICD-10-CM | POA: Diagnosis present

## 2017-02-27 DIAGNOSIS — W19XXXD Unspecified fall, subsequent encounter: Secondary | ICD-10-CM | POA: Diagnosis present

## 2017-02-27 DIAGNOSIS — E875 Hyperkalemia: Secondary | ICD-10-CM | POA: Diagnosis present

## 2017-02-27 DIAGNOSIS — Z87891 Personal history of nicotine dependence: Secondary | ICD-10-CM

## 2017-02-27 DIAGNOSIS — Z9981 Dependence on supplemental oxygen: Secondary | ICD-10-CM

## 2017-02-27 DIAGNOSIS — I313 Pericardial effusion (noninflammatory): Secondary | ICD-10-CM | POA: Diagnosis present

## 2017-02-27 DIAGNOSIS — Z833 Family history of diabetes mellitus: Secondary | ICD-10-CM

## 2017-02-27 DIAGNOSIS — I5033 Acute on chronic diastolic (congestive) heart failure: Secondary | ICD-10-CM | POA: Diagnosis not present

## 2017-02-27 DIAGNOSIS — E896 Postprocedural adrenocortical (-medullary) hypofunction: Secondary | ICD-10-CM | POA: Diagnosis present

## 2017-02-27 DIAGNOSIS — S72002D Fracture of unspecified part of neck of left femur, subsequent encounter for closed fracture with routine healing: Secondary | ICD-10-CM

## 2017-02-27 DIAGNOSIS — Z79899 Other long term (current) drug therapy: Secondary | ICD-10-CM

## 2017-02-27 DIAGNOSIS — N184 Chronic kidney disease, stage 4 (severe): Secondary | ICD-10-CM | POA: Diagnosis present

## 2017-02-27 DIAGNOSIS — Z825 Family history of asthma and other chronic lower respiratory diseases: Secondary | ICD-10-CM

## 2017-02-27 DIAGNOSIS — F411 Generalized anxiety disorder: Secondary | ICD-10-CM | POA: Diagnosis present

## 2017-02-27 DIAGNOSIS — J9621 Acute and chronic respiratory failure with hypoxia: Secondary | ICD-10-CM | POA: Diagnosis present

## 2017-02-27 DIAGNOSIS — I255 Ischemic cardiomyopathy: Secondary | ICD-10-CM | POA: Diagnosis present

## 2017-02-27 DIAGNOSIS — Z923 Personal history of irradiation: Secondary | ICD-10-CM

## 2017-02-27 DIAGNOSIS — Z955 Presence of coronary angioplasty implant and graft: Secondary | ICD-10-CM

## 2017-02-27 DIAGNOSIS — I130 Hypertensive heart and chronic kidney disease with heart failure and stage 1 through stage 4 chronic kidney disease, or unspecified chronic kidney disease: Secondary | ICD-10-CM | POA: Diagnosis present

## 2017-02-27 DIAGNOSIS — D631 Anemia in chronic kidney disease: Secondary | ICD-10-CM | POA: Diagnosis present

## 2017-02-27 DIAGNOSIS — Z7189 Other specified counseling: Secondary | ICD-10-CM

## 2017-02-27 DIAGNOSIS — Z950 Presence of cardiac pacemaker: Secondary | ICD-10-CM

## 2017-02-27 DIAGNOSIS — Z91040 Latex allergy status: Secondary | ICD-10-CM

## 2017-02-27 DIAGNOSIS — E785 Hyperlipidemia, unspecified: Secondary | ICD-10-CM | POA: Diagnosis present

## 2017-02-27 DIAGNOSIS — Z90710 Acquired absence of both cervix and uterus: Secondary | ICD-10-CM

## 2017-02-27 DIAGNOSIS — J90 Pleural effusion, not elsewhere classified: Secondary | ICD-10-CM | POA: Diagnosis not present

## 2017-02-27 DIAGNOSIS — Z87440 Personal history of urinary (tract) infections: Secondary | ICD-10-CM

## 2017-02-27 DIAGNOSIS — J189 Pneumonia, unspecified organism: Secondary | ICD-10-CM | POA: Diagnosis present

## 2017-02-27 DIAGNOSIS — J181 Lobar pneumonia, unspecified organism: Secondary | ICD-10-CM | POA: Diagnosis present

## 2017-02-27 DIAGNOSIS — J432 Centrilobular emphysema: Secondary | ICD-10-CM | POA: Diagnosis not present

## 2017-02-27 DIAGNOSIS — Z85118 Personal history of other malignant neoplasm of bronchus and lung: Secondary | ICD-10-CM

## 2017-02-27 DIAGNOSIS — I509 Heart failure, unspecified: Secondary | ICD-10-CM | POA: Diagnosis not present

## 2017-02-27 DIAGNOSIS — Z961 Presence of intraocular lens: Secondary | ICD-10-CM | POA: Diagnosis present

## 2017-02-27 DIAGNOSIS — I481 Persistent atrial fibrillation: Secondary | ICD-10-CM | POA: Diagnosis present

## 2017-02-27 DIAGNOSIS — Z885 Allergy status to narcotic agent status: Secondary | ICD-10-CM

## 2017-02-27 DIAGNOSIS — M810 Age-related osteoporosis without current pathological fracture: Secondary | ICD-10-CM | POA: Diagnosis present

## 2017-02-27 DIAGNOSIS — K760 Fatty (change of) liver, not elsewhere classified: Secondary | ICD-10-CM | POA: Diagnosis present

## 2017-02-27 DIAGNOSIS — Z9049 Acquired absence of other specified parts of digestive tract: Secondary | ICD-10-CM

## 2017-02-27 DIAGNOSIS — I429 Cardiomyopathy, unspecified: Secondary | ICD-10-CM | POA: Diagnosis present

## 2017-02-27 DIAGNOSIS — H9190 Unspecified hearing loss, unspecified ear: Secondary | ICD-10-CM | POA: Diagnosis present

## 2017-02-27 DIAGNOSIS — I340 Nonrheumatic mitral (valve) insufficiency: Secondary | ICD-10-CM | POA: Diagnosis present

## 2017-02-27 DIAGNOSIS — D649 Anemia, unspecified: Secondary | ICD-10-CM | POA: Diagnosis not present

## 2017-02-27 DIAGNOSIS — I5043 Acute on chronic combined systolic (congestive) and diastolic (congestive) heart failure: Principal | ICD-10-CM | POA: Diagnosis present

## 2017-02-27 DIAGNOSIS — M109 Gout, unspecified: Secondary | ICD-10-CM | POA: Diagnosis present

## 2017-02-27 DIAGNOSIS — Z882 Allergy status to sulfonamides status: Secondary | ICD-10-CM

## 2017-02-27 DIAGNOSIS — Z8249 Family history of ischemic heart disease and other diseases of the circulatory system: Secondary | ICD-10-CM

## 2017-02-27 DIAGNOSIS — Z79891 Long term (current) use of opiate analgesic: Secondary | ICD-10-CM

## 2017-02-27 DIAGNOSIS — Z9841 Cataract extraction status, right eye: Secondary | ICD-10-CM

## 2017-02-27 DIAGNOSIS — R0603 Acute respiratory distress: Secondary | ICD-10-CM | POA: Diagnosis not present

## 2017-02-27 DIAGNOSIS — Z881 Allergy status to other antibiotic agents status: Secondary | ICD-10-CM

## 2017-02-27 DIAGNOSIS — N390 Urinary tract infection, site not specified: Secondary | ICD-10-CM | POA: Diagnosis present

## 2017-02-27 DIAGNOSIS — Z880 Allergy status to penicillin: Secondary | ICD-10-CM

## 2017-02-27 DIAGNOSIS — Z8701 Personal history of pneumonia (recurrent): Secondary | ICD-10-CM

## 2017-02-27 LAB — CBC
HCT: 25.1 % — ABNORMAL LOW (ref 35.0–47.0)
Hemoglobin: 7.9 g/dL — ABNORMAL LOW (ref 12.0–16.0)
MCH: 25.7 pg — AB (ref 26.0–34.0)
MCHC: 31.6 g/dL — AB (ref 32.0–36.0)
MCV: 81.4 fL (ref 80.0–100.0)
PLATELETS: 254 10*3/uL (ref 150–440)
RBC: 3.08 MIL/uL — AB (ref 3.80–5.20)
RDW: 19.3 % — AB (ref 11.5–14.5)
WBC: 15.3 10*3/uL — ABNORMAL HIGH (ref 3.6–11.0)

## 2017-02-27 LAB — BASIC METABOLIC PANEL
Anion gap: 12 (ref 5–15)
BUN: 47 mg/dL — AB (ref 6–20)
CALCIUM: 8.3 mg/dL — AB (ref 8.9–10.3)
CO2: 31 mmol/L (ref 22–32)
CREATININE: 1.8 mg/dL — AB (ref 0.44–1.00)
Chloride: 95 mmol/L — ABNORMAL LOW (ref 101–111)
GFR calc non Af Amer: 25 mL/min — ABNORMAL LOW (ref >60)
GFR, EST AFRICAN AMERICAN: 29 mL/min — AB (ref >60)
GLUCOSE: 142 mg/dL — AB (ref 65–99)
Potassium: 4 mmol/L (ref 3.5–5.1)
Sodium: 138 mmol/L (ref 135–145)

## 2017-02-27 LAB — URINALYSIS, COMPLETE (UACMP) WITH MICROSCOPIC
Bilirubin Urine: NEGATIVE
Glucose, UA: NEGATIVE mg/dL
Hgb urine dipstick: NEGATIVE
KETONES UR: NEGATIVE mg/dL
Nitrite: POSITIVE — AB
PROTEIN: NEGATIVE mg/dL
Specific Gravity, Urine: 1.01 (ref 1.005–1.030)
pH: 5 (ref 5.0–8.0)

## 2017-02-27 LAB — LACTIC ACID, PLASMA: LACTIC ACID, VENOUS: 0.8 mmol/L (ref 0.5–1.9)

## 2017-02-27 LAB — GLUCOSE, CAPILLARY
Glucose-Capillary: 135 mg/dL — ABNORMAL HIGH (ref 65–99)
Glucose-Capillary: 98 mg/dL (ref 65–99)
Glucose-Capillary: 110 mg/dL — ABNORMAL HIGH (ref 65–99)

## 2017-02-27 LAB — MRSA PCR SCREENING: MRSA by PCR: NEGATIVE

## 2017-02-27 LAB — TROPONIN I

## 2017-02-27 MED ORDER — CYCLOBENZAPRINE HCL 5 MG PO TABS
5.0000 mg | ORAL_TABLET | Freq: Three times a day (TID) | ORAL | Status: DC | PRN
  Administered 2017-02-27: 5 mg via ORAL
  Filled 2017-02-27 (×3): qty 1

## 2017-02-27 MED ORDER — AZTREONAM 1 G IJ SOLR
1.0000 g | Freq: Three times a day (TID) | INTRAMUSCULAR | Status: DC
  Filled 2017-02-27 (×2): qty 1

## 2017-02-27 MED ORDER — PANTOPRAZOLE SODIUM 40 MG PO TBEC
40.0000 mg | DELAYED_RELEASE_TABLET | Freq: Every evening | ORAL | Status: DC
  Administered 2017-02-27 – 2017-03-01 (×3): 40 mg via ORAL
  Filled 2017-02-27 (×3): qty 1

## 2017-02-27 MED ORDER — ACETAMINOPHEN 650 MG RE SUPP: 650.0000 mg | Freq: Four times a day (QID) | RECTAL | Status: DC | PRN

## 2017-02-27 MED ORDER — FENOFIBRATE 54 MG PO TABS
54.0000 mg | ORAL_TABLET | Freq: Every day | ORAL | Status: DC
  Administered 2017-02-27 – 2017-03-02 (×4): 54 mg via ORAL
  Filled 2017-02-27 (×4): qty 1

## 2017-02-27 MED ORDER — VANCOMYCIN HCL IN DEXTROSE 1-5 GM/200ML-% IV SOLN
1000.0000 mg | INTRAVENOUS | Status: DC
  Filled 2017-02-27: qty 200

## 2017-02-27 MED ORDER — ALPRAZOLAM 0.5 MG PO TABS
0.5000 mg | ORAL_TABLET | Freq: Every day | ORAL | Status: DC
  Administered 2017-02-27 – 2017-03-01 (×3): 0.5 mg via ORAL
  Filled 2017-02-27 (×3): qty 1

## 2017-02-27 MED ORDER — AZITHROMYCIN 500 MG PO TABS
500.0000 mg | ORAL_TABLET | Freq: Every day | ORAL | Status: DC
  Administered 2017-02-27 – 2017-03-02 (×4): 500 mg via ORAL
  Filled 2017-02-27 (×4): qty 1

## 2017-02-27 MED ORDER — INSULIN ASPART 100 UNIT/ML ~~LOC~~ SOLN
0.0000 [IU] | Freq: Three times a day (TID) | SUBCUTANEOUS | Status: DC
  Administered 2017-02-27 – 2017-02-28 (×2): 1 [IU] via SUBCUTANEOUS
  Administered 2017-02-28: 17:00:00 2 [IU] via SUBCUTANEOUS
  Administered 2017-03-01: 1 [IU] via SUBCUTANEOUS
  Administered 2017-03-01: 18:00:00 2 [IU] via SUBCUTANEOUS
  Administered 2017-03-01 – 2017-03-02 (×2): 1 [IU] via SUBCUTANEOUS
  Filled 2017-02-27 (×7): qty 1

## 2017-02-27 MED ORDER — INSULIN ASPART 100 UNIT/ML ~~LOC~~ SOLN: 0.0000 [IU] | Freq: Every day | SUBCUTANEOUS | Status: DC

## 2017-02-27 MED ORDER — ONDANSETRON HCL 4 MG/2ML IJ SOLN: 4.0000 mg | Freq: Four times a day (QID) | INTRAMUSCULAR | Status: DC | PRN

## 2017-02-27 MED ORDER — POLYETHYLENE GLYCOL 3350 17 G PO PACK
17.0000 g | PACK | Freq: Every day | ORAL | Status: DC | PRN
  Administered 2017-03-02: 08:00:00 17 g via ORAL
  Filled 2017-02-27: qty 1

## 2017-02-27 MED ORDER — VANCOMYCIN HCL IN DEXTROSE 1-5 GM/200ML-% IV SOLN
1000.0000 mg | Freq: Once | INTRAVENOUS | Status: AC
  Administered 2017-02-27: 1000 mg via INTRAVENOUS
  Filled 2017-02-27: qty 200

## 2017-02-27 MED ORDER — FERROUS SULFATE 325 (65 FE) MG PO TABS
325.0000 mg | ORAL_TABLET | Freq: Every day | ORAL | Status: DC
  Administered 2017-02-27 – 2017-02-28 (×2): 325 mg via ORAL
  Filled 2017-02-27 (×2): qty 1

## 2017-02-27 MED ORDER — TIOTROPIUM BROMIDE MONOHYDRATE 18 MCG IN CAPS
18.0000 ug | ORAL_CAPSULE | Freq: Every day | RESPIRATORY_TRACT | Status: DC | PRN
  Filled 2017-02-27: qty 5

## 2017-02-27 MED ORDER — FUROSEMIDE 10 MG/ML IJ SOLN
40.0000 mg | Freq: Once | INTRAMUSCULAR | Status: AC
Start: 2017-02-27 — End: 2017-02-27
  Administered 2017-02-27: 40 mg via INTRAVENOUS
  Filled 2017-02-27: qty 4

## 2017-02-27 MED ORDER — ATORVASTATIN CALCIUM 20 MG PO TABS
20.0000 mg | ORAL_TABLET | Freq: Every evening | ORAL | Status: DC
  Administered 2017-02-27 – 2017-03-01 (×3): 20 mg via ORAL
  Filled 2017-02-27 (×3): qty 1

## 2017-02-27 MED ORDER — ENOXAPARIN SODIUM 40 MG/0.4ML ~~LOC~~ SOLN
30.0000 mg | SUBCUTANEOUS | Status: DC
  Administered 2017-02-27: 30 mg via SUBCUTANEOUS
  Filled 2017-02-27: qty 0.4

## 2017-02-27 MED ORDER — ACETAMINOPHEN 325 MG PO TABS
650.0000 mg | ORAL_TABLET | Freq: Four times a day (QID) | ORAL | Status: DC | PRN
  Administered 2017-03-01 – 2017-03-02 (×4): 650 mg via ORAL
  Filled 2017-02-27 (×4): qty 2

## 2017-02-27 MED ORDER — FUROSEMIDE 10 MG/ML IJ SOLN
40.0000 mg | Freq: Two times a day (BID) | INTRAMUSCULAR | Status: DC
  Administered 2017-02-27 – 2017-03-01 (×5): 40 mg via INTRAVENOUS
  Filled 2017-02-27 (×6): qty 4

## 2017-02-27 MED ORDER — ONDANSETRON HCL 4 MG PO TABS: 4.0000 mg | ORAL_TABLET | Freq: Four times a day (QID) | ORAL | Status: DC | PRN

## 2017-02-27 MED ORDER — TRAMADOL HCL 50 MG PO TABS
50.0000 mg | ORAL_TABLET | Freq: Four times a day (QID) | ORAL | Status: DC | PRN
  Administered 2017-02-27: 21:00:00 50 mg via ORAL
  Filled 2017-02-27: qty 1

## 2017-02-27 MED ORDER — METOPROLOL SUCCINATE ER 25 MG PO TB24
12.5000 mg | ORAL_TABLET | Freq: Every day | ORAL | Status: DC
  Administered 2017-02-27 – 2017-03-02 (×5): 12.5 mg via ORAL
  Filled 2017-02-27 (×4): qty 1

## 2017-02-27 MED ORDER — SODIUM CHLORIDE 0.9% FLUSH
3.0000 mL | Freq: Two times a day (BID) | INTRAVENOUS | Status: DC
  Administered 2017-02-27 – 2017-03-02 (×7): 3 mL via INTRAVENOUS

## 2017-02-27 MED ORDER — AZTREONAM 2 G IJ SOLR
2.0000 g | Freq: Once | INTRAMUSCULAR | Status: AC
  Administered 2017-02-27: 2 g via INTRAVENOUS
  Filled 2017-02-27: qty 2

## 2017-02-27 MED ORDER — SERTRALINE HCL 50 MG PO TABS
50.0000 mg | ORAL_TABLET | Freq: Every day | ORAL | Status: DC
  Administered 2017-02-27 – 2017-03-02 (×4): 50 mg via ORAL
  Filled 2017-02-27 (×4): qty 1

## 2017-02-27 MED ORDER — SODIUM CHLORIDE 0.9 % IV SOLN
500.0000 mg | Freq: Two times a day (BID) | INTRAVENOUS | Status: DC
  Administered 2017-02-27 – 2017-03-01 (×4): 500 mg via INTRAVENOUS
  Filled 2017-02-27 (×5): qty 0.5

## 2017-02-27 MED ORDER — FLUTICASONE FUROATE-VILANTEROL 100-25 MCG/INH IN AEPB
1.0000 | INHALATION_SPRAY | Freq: Every day | RESPIRATORY_TRACT | Status: DC | PRN
  Filled 2017-02-27: qty 28

## 2017-02-27 MED ORDER — ENSURE ENLIVE PO LIQD
237.0000 mL | Freq: Three times a day (TID) | ORAL | Status: DC
  Administered 2017-02-27 – 2017-03-02 (×7): 237 mL via ORAL

## 2017-02-27 MED ORDER — NITROGLYCERIN 0.4 MG SL SUBL: 0.4000 mg | SUBLINGUAL_TABLET | SUBLINGUAL | Status: DC | PRN

## 2017-02-27 NOTE — ED Provider Notes (Signed)
[facility] Emergency Department Provider Note ____________________________________________   I have reviewed the triage vital signs and the triage nursing note.  HISTORY  Chief Complaint Shortness of Breath and Weakness   Historian Patient  HPI Regina Burke is a 83 y.o. female with a history of asthma/COPD, home oxygen 2 L, chronic diastolic heart failurefor which she reports taking 40 mg twice daily Lasix, presents to the ER stating about one week of increased generalized weakness and dyspnea and body swelling, lower extremities up into the abdomen.  States that she just recently [facility] few days ago after being there with recovery from a hip fracture.  She reports she was recently treated for urinary tract infection and thinks that she might still have it.  Denies coughing or productive sputum or fever. States that dyspnea is worse with walking and with laying flat.  Symptoms are moderate to severe.    Past Medical History:  Diagnosis Date  . Anemia   . Arthritis   . Asthma   . Cardiac resynchronization therapy pacemaker (CRT-P) in place    a. 03/31/16:  Medtronic Percepta Quad CRT-P MRI SureScan (serial Number [id number]) device.  . Chronic diastolic CHF (congestive heart failure) ([place])    a. 10/2015 Echo: EF 55-65%, Gr1 DD, mild MR, mildly dil LA, nl RV fxn, nl PASP;  b. 03/2016 Echo: EF 30-35%, mod MR, mod dil LA; c. 12/2016 Echo: EF 60-65%, no rwma, mild AS, mildly dil LA, PASP 4mmHg.  . CKD (chronic kidney disease), stage IV ([place])   . COPD (chronic obstructive pulmonary disease) ([place])   . Coronary artery disease    a. 11/2014 NSTEMI/PCI: LM nl, LAD 82m, D1 30, LCX mild dzs, OM1 20p, OM2 18m, OM3 90p (2.25x8 Promus Premier DES), RCA nl.   . Cough    CHRONIC AT NIGHT  . Cushing's disease ([place])   . Depression   . Diverticulitis   . Edema    FEET/LEGS  . GERD (gastroesophageal reflux disease)   . Gout   . History of  hiatal hernia   . History of pneumonia   . HLD (hyperlipidemia)   . HOH (hard of hearing)   . Hypertensive heart disease   . Lung cancer ([place]) dx'd 2014   S/P radiation 2015  . Migraine   . Mixed Ischemic and Nonischemic Cardiomyopathy ([place])    a. 03/2016 Echo: EF 30-35%;  b. 12/2016 Echo: EF 60-65%.  . Moderate mitral regurgitation   . Multiple allergies   . Myocardial infarction ([place])   . Oxygen deficiency    2LITERS  . Persistent atrial fibrillation ([place])    a. CHADS2VASc ==> 7 (CHF, HTN, age x 2, DM, vascular disease, and gender)--was on renal dosed eliquis but this was d/c'd in 12/2016 in setting of dark stools/anemia;  c. 03/2016 s/p AVN and MDT BiV ICD placement.  . Presence of permanent cardiac pacemaker   . S/P AV nodal ablation    a. on 03/31/16 for persistant afib with CRT-P placement  . Sleep apnea   . Type II diabetes mellitus [facility])     Patient Active Problem List   Diagnosis Date Noted  . S/p left hip fracture 01/31/2017  . Pre-operative cardiovascular examination 01/31/2017  . GI bleed   . Other diseases of stomach and duodenum   . CHF, acute on chronic ([place]) 01/23/2017  . Chest pain 01/12/2017  . Dizziness 08/25/2016  . LBBB (left bundle branch block) 04/01/2016  .  S/P AV nodal ablation   . Cardiac resynchronization therapy pacemaker (CRT-P) in place   . Persistent atrial fibrillation ([place]): CHA2DS2-Vasc = ~7. On Xarelto 15 mg (age & renal Fxn) 03/18/2016  . Congestive dilated cardiomyopathy ([place]) 03/18/2016    Class: Temporary  . DNR (do not resuscitate) discussion   . Palliative care encounter   . Persistent atrial fibrillation ([place])   . Chronic diastolic CHF (congestive heart failure) ([place]) 02/16/2016  . HLD (hyperlipidemia) 02/16/2016  . Iron deficiency anemia due to chronic blood loss   . Essential hypertension   . Dyspnea 11/25/2015  . Diaphoresis   . Renal insufficiency   . Centrilobular emphysema ([place])   . Chronic obstructive pulmonary disease ([place])  07/14/2015  . Status post thoracentesis   . S/P thoracentesis   . H/O: lung cancer   . Pleural effusion   . Coronary artery disease involving native coronary artery of native heart without angina pectoris   . S/P coronary artery stent placement   . Pressure ulcer 06/08/2015  . Dehydration 05/09/2015  . Diabetes ([place]) 05/09/2015  . Closed fracture nasal bone 05/09/2015  . Cancer of upper lobe of right lung ([place]) 01/08/2015  . Allergic state 01/08/2015  . Anemia associated with chronic renal failure 01/08/2015  . Chronic kidney disease 01/08/2015  . CAFL (chronic airflow limitation) ([place]) 01/08/2015  . Arthritis, degenerative 01/08/2015  . 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 Diabetes mellitus, type 2 ([place]) 09/11/2014  . Type 2 diabetes mellitus ([place]) 09/11/2014    Past Surgical History:  Procedure Laterality Date  . ABDOMINAL HYSTERECTOMY    . ABLATION     July 2017  . ADRENALECTOMY Left 1980's   "[place]"  . APPENDECTOMY    . BREAST CYST EXCISION Left   . CATARACT EXTRACTION W/PHACO Right 12/28/2015   Procedure: CATARACT EXTRACTION PHACO AND INTRAOCULAR LENS PLACEMENT (IOC);  Surgeon: Birder Robson, MD;  Location: [facility] ORS;  Service: Ophthalmology;  Laterality: Right;  [place] 48.4   . CATARACT EXTRACTION W/PHACO Left 11/14/2016   Procedure: CATARACT EXTRACTION PHACO AND INTRAOCULAR LENS PLACEMENT (IOC);  Surgeon: Birder Robson, MD;  Location: [facility] ORS;  Service: Ophthalmology;  Laterality: Left;  [place] 59.8 AP% 18.1 CDE 10.78 Fluid pack lot # [id number]  . CHOLECYSTECTOMY    . CORONARY ANGIOPLASTY WITH STENT PLACEMENT  12/23/2014  . ELECTROPHYSIOLOGIC STUDY N/A 03/08/2016   Procedure: CARDIOVERSION;  Surgeon: Wende Bushy, MD;  Location: [facility] ORS;  Service: Cardiovascular;  Laterality: N/A;  . ELECTROPHYSIOLOGIC STUDY N/A 03/07/2016   Procedure: Cardioversion;  Surgeon: Wende Bushy, MD;  Location: [facility] ORS;  Service: Cardiovascular;  Laterality: N/A;  . ELECTROPHYSIOLOGIC  STUDY N/A 03/31/2016   Procedure: AV Node Ablation;  Surgeon: Will Meredith Leeds, MD;  Location: [place] CV LAB;  Service: Cardiovascular;  Laterality: N/A;  . EP IMPLANTABLE DEVICE N/A 03/31/2016   Procedure: BiV Pacemaker Insertion CRT-P;  Surgeon: Will Meredith Leeds, MD;  Location: [place] CV LAB;  Service: Cardiovascular;  Laterality: N/A;  . ESOPHAGOGASTRODUODENOSCOPY (EGD) WITH PROPOFOL N/A 01/26/2017   Procedure: ESOPHAGOGASTRODUODENOSCOPY (EGD) WITH PROPOFOL;  Surgeon: [name], MD;  Location: [facility] ENDOSCOPY;  Service: Endoscopy;  Laterality: N/A;  . FRACTURE SURGERY    . HIP PINNING,CANNULATED Left 02/01/2017   Procedure: CANNULATED HIP PINNING;  Surgeon: Lovell Sheehan, MD;  Location: [facility] ORS;  Service: Orthopedics;  Laterality: Left;  . INSERT / REPLACE / REMOVE PACEMAKER  02/2016  . LEFT HEART CATHETERIZATION WITH CORONARY ANGIOGRAM N/A 12/23/2014   Procedure: LEFT HEART  CATHETERIZATION WITH CORONARY ANGIOGRAM;  Surgeon: Burnell Blanks, MD;  Location: [facility] CATH LAB;  Service: Cardiovascular;  Laterality: N/A;  . PERCUTANEOUS CORONARY STENT INTERVENTION (PCI-S)  12/23/2014   Procedure: PERCUTANEOUS CORONARY STENT INTERVENTION (PCI-S);  Surgeon: Burnell Blanks, MD;  Location: [facility] CATH LAB;  Service: Cardiovascular;;  Promus 2.25x8  . TONSILLECTOMY    . TRANSTHORACIC ECHOCARDIOGRAM  11/26/2015   Technically difficult study. EF 55-60%. Normal wall motion. GR 1 DD.  . TUBAL LIGATION    . WRIST FRACTURE SURGERY Bilateral ~ 2000    Prior to Admission medications   Medication Sig Start Date End Date Taking? Authorizing Provider  ALPRAZolam Duanne Moron) 0.5 MG tablet Take 0.5 mg by mouth at bedtime. 12/27/16   [provider]  atorvastatin (LIPITOR) 20 MG tablet Take 1 tablet (20 mg total) by mouth every evening. 12/03/15   Minna Merritts, MD  cholecalciferol (VITAMIN D) 400 UNITS TABS tablet Take 400 Units by mouth daily.     [provider]   cyanocobalamin 500 MCG tablet Take 500 mcg by mouth daily.     [provider]  fenofibrate (TRICOR) 48 MG tablet Take 48 mg by mouth every evening.     [provider]  ferrous sulfate 325 (65 FE) MG EC tablet Take 325 mg by mouth daily with breakfast.     [provider]  fluticasone furoate-vilanterol (BREO ELLIPTA) 100-25 MCG/INH AEPB Inhale 1 puff into the lungs daily as needed.     [provider]  furosemide (LASIX) 40 MG tablet Take 1 tablet (40 mg total) by mouth daily. 04/01/16   Eileen Stanford, PA-C  metoprolol succinate (TOPROL XL) 25 MG 24 hr tablet Take 0.5 tablets (12.5 mg total) by mouth daily. 12/19/16   Minna Merritts, MD  Multiple Vitamins-Minerals (PRESERVISION AREDS 2) CAPS Take 1 capsule by mouth daily.    [provider]  nitroGLYCERIN (NITROSTAT) 0.4 MG SL tablet Place 1 tablet (0.4 mg total) under the tongue every 5 (five) minutes as needed for chest pain. 12/24/14   Barrett, Evelene Croon, PA-C  pantoprazole (PROTONIX) 40 MG tablet Take 40 mg by mouth every evening.     [provider]  sertraline (ZOLOFT) 50 MG tablet Take 1 tablet (50 mg total) by mouth daily. 03/24/16   Gladstone Lighter, MD  tiotropium (SPIRIVA) 18 MCG inhalation capsule Place 18 mcg into inhaler and inhale daily as needed.     [provider]  traMADol (ULTRAM) 50 MG tablet Take 50 mg by mouth every 6 (six) hours as needed.    [provider]    Allergies  Allergen Reactions  . Ciprofloxacin Shortness Of Breath, Itching and Rash  . Doxycycline Shortness Of Breath, Itching and Rash  . Penicillins Shortness Of Breath, Itching, Rash and Other (See Comments)    Has patient had a PCN reaction causing immediate rash, facial/tongue/throat swelling, SOB or lightheadedness with hypotension: Yes Has patient had a PCN reaction causing severe rash involving mucus membranes or skin necrosis: No Has patient had a PCN reaction that required  hospitalization No Has patient had a PCN reaction occurring within the last 10 years: No If all of the above answers are "NO", then may proceed with Cephalosporin use.  . Sulfa Antibiotics Shortness Of Breath, Itching and Rash  . Latex Itching  . Morphine And Related Itching  . Albuterol Sulfate   . Cefuroxime Rash    Blisters in mouth    Family History  Problem Relation Age of Onset  . Heart disease Mother   . Diabetes Mother   . Osteoarthritis Mother   . Hypertension Mother   . Heart disease Father   . Hypertension Father   . COPD Brother     Social History Social History  Substance Use Topics  . Smoking status: Former Smoker    Packs/day: 1.00    Years: 45.00    Types: Cigarettes    Quit date: 04/25/1994  . Smokeless tobacco: Never Used  . Alcohol use No     Comment: 12/23/2014 "might have a couple mixed drinks/year"    Review of Systems  Constitutional: Negative for fever. Eyes: Negative for visual changes. ENT: Negative for sore throat. Cardiovascular: Negative for chest pain. Respiratory: Positive for shortness of breath. Gastrointestinal: Negative for abdominal pain, vomiting and diarrhea. Genitourinary: Negative for dysuria. Musculoskeletal: Negative for back pain. Skin: Negative for rash. Neurological: Negative for headache.  ____________________________________________   PHYSICAL EXAM:  VITAL SIGNS: ED Triage Vitals  Enc Vitals Group     BP 02/27/17 0639 (!) 158/59     Pulse Rate 02/27/17 0639 87     Resp 02/27/17 0639 18     Temp 02/27/17 0639 99.2 F (37.3 C)     Temp Source 02/27/17 0639 Oral     SpO2 02/27/17 0639 98 %     Weight 02/27/17 0639 179 lb (81.2 kg)     Height 02/27/17 0639 5\' 4"  (1.626 m)     Head Circumference --      [facility] --      Pain Score 02/27/17 0638 0     Pain Loc --      Pain Edu? --      Excl. in [place]? --      Constitutional: Alert and oriented. Well appearing and in no distress. HEENT   Head:  Normocephalic and atraumatic.      Eyes: Conjunctivae are normal. Pupils equal and round.       Ears:         Nose: No congestion/rhinnorhea.   Mouth/Throat: Mucous membranes are moist.   Neck: No stridor. Cardiovascular/Chest: Normal rate, regular rhythm.  No murmurs, rubs, or gallops. Respiratory: Decreased air movement throughout without rhonchi or wheezing. Speaking short sentences and slightly dyspneic, but in no distress. Gastrointestinal: Soft. No distention, no guarding, no rebound. Nontender.  Obese  Genitourinary/rectal:Deferred Musculoskeletal: Nontender with normal range of motion in all extremities. No joint effusions.  No lower extremity tenderness. 2+ lower extremity pitting edema bilaterally, equally. Neurologic:  Normal speech and language. No gross or focal neurologic deficits are appreciated. Skin:  Skin is warm, dry and intact. No rash noted. Psychiatric: Mood and affect are normal. Speech and behavior are normal. Patient exhibits appropriate insight and judgment.   ____________________________________________  LABS (pertinent positives/negatives)  Labs Reviewed  BASIC METABOLIC PANEL - Abnormal; Notable for the following:       Result Value   Chloride 95 (*)    Glucose, Bld 142 (*)    BUN 47 (*)    Creatinine, Ser 1.80 (*)    Calcium 8.3 (*)    GFR calc non Af Amer 25 (*)    GFR calc Af Amer 29 (*)    All other components within normal limits  CBC - Abnormal; Notable for the following:    WBC 15.3 (*)    RBC 3.08 (*)    Hemoglobin 7.9 (*)    HCT 25.1 (*)  MCH 25.7 (*)    MCHC 31.6 (*)    RDW 19.3 (*)    All other components within normal limits  URINALYSIS, COMPLETE (UACMP) WITH MICROSCOPIC - Abnormal; Notable for the following:    Color, Urine YELLOW (*)    APPearance CLOUDY (*)    Nitrite POSITIVE (*)    Leukocytes, UA LARGE (*)    Bacteria, UA MANY (*)    Squamous Epithelial / LPF 0-5 (*)    All other components within normal limits   CULTURE, BLOOD (ROUTINE X 2)  CULTURE, BLOOD (ROUTINE X 2)  TROPONIN I  LACTIC ACID, PLASMA  LACTIC ACID, PLASMA    ____________________________________________    EKG I, Lisa Roca, MD, the attending physician have personally viewed and interpreted all ECGs.  79 bpm. Ventricularly paced rhythm. ____________________________________________  RADIOLOGY All Xrays were viewed by me. Imaging interpreted by Radiologist.  CXR:  IMPRESSION: 1. Increased opacity at the left lung base most consistent with left pleural effusion, left basilar atelectasis, and possibly left basilar pneumonia. 2. Cardiomegaly and mild pulmonary vascular congestion. __________________________________________  PROCEDURES  Procedure(s) performed: External jugular IV, peripheral IV was performed by myself, Dr. Lisa Roca M.D. Initially was that was accessed, but then blue. Left side was accessed with [id number]-[id number] IV, no complications.  Critical Care performed: None  ____________________________________________   ED COURSE / ASSESSMENT AND PLAN  Pertinent labs & imaging results that were available during my care of the patient were reviewed by me and considered in my medical decision making (see chart for details).   [name] came in complaining of dyspnea and although she does not appear to be hypoxic to her baseline at rest, on home to percent oxygenation on home 2 L, she does report extreme swelling and dyspnea especially orthopnea that seems consistent with CHF exacerbation. She's not really wheezing and obvious asthma/COPD. She reports mild cough, and generalized weakness, but no fevers.  Chest x-ray shows mild congestion, but also possible left lower lobe infiltrate.  Laboratory studies her white blood count is elevated, and she has a low-grade temperature here with a heart rate of 100 insight and going to place her on the sepsis protocol though I don't think that she is currently septic. I think  most likely the active problem causing her fatigue and dyspnea is CHF exacerbation. I am covering her with antibiotic soap cover for Hospital/healthcare acquired pneumonia, and treating her with Lasix because I think again the most likely cause her symptoms is actually CHF.   CONSULTATIONS:   Hospitalist for admission, Dr. Darvin Neighbours.  Patient / Family / Caregiver informed of clinical course, medical decision-making process, and agree with plan.  ___________________________________________   FINAL CLINICAL IMPRESSION(S) / ED DIAGNOSES   Final diagnoses:  Acute on chronic heart failure, unspecified heart failure type ([place])  Shortness of breath  Pneumonia of left lower lobe due to infectious organism [facility])              Note: This dictation was prepared with Dragon dictation. Any transcriptional errors that result from this process are unintentional    Lisa Roca, MD 02/27/17 [phone]

## 2017-02-27 NOTE — Discharge Instructions (Signed)
Heart Failure Clinic appointment on March 15, 2017 at 12:20pm with Darylene Price, [place]. Please call [phone] to reschedule.

## 2017-02-27 NOTE — Progress Notes (Signed)
Initial Nutrition Assessment  DOCUMENTATION CODES:   Not applicable  INTERVENTION:  Provide Ensure Enlive po TID with meals, each supplement provides 350 kcal and 20 grams of protein.  Encouraged adequate intake of calories and protein at meals.  Reviewed "Heart Failure Nutrition Therapy" with patient. Note to follow.  NUTRITION DIAGNOSIS:   Inadequate oral intake related to poor appetite, nausea as evidenced by per patient/family report.  GOAL:   Patient will meet greater than or equal to 90% of their needs  MONITOR:   PO intake, Supplement acceptance, Labs, Weight trends, I & O's  REASON FOR ASSESSMENT:   Malnutrition Screening Tool    ASSESSMENT:   83 year old female with PMHx of COPD, HTN, HLD, sleep apnea, Cushing's disease, DM type 2, GERD, hx of hiatal hernia, CAD, hx of lung cancer s/p XRT in 2015, diverticulitis, hx of MI, gout, mixed ischemic and nonischemic cardiomyopathy, chronic diastolic CHF, CKD stage IV, recent hip fracture s/p cannulated hip pinning on 02/01/2017 presents with SOB and worsening edema found to have PNA, acute on chronic diastolic CHF.   Spoke with patient at bedside. She reports that for the past month she has been at [facility] for rehab. She just returned home two days ago. She is living with her son who is assisting her with meals. She reports she has had a poor appetite during her stay at rehab and it still has not returned since being back at home. She reports she has only been eating one small meal per day at best (for example had 1/2 ham and cheese sandwich last night). She reports she has been drinking Ensure and Medical illustrator. She cannot remember which type of Ensure it is, but reports she drinks three per day since she is not eating well at meals (if original only contains 220 kcal and 9 grams of protein). Patient endorses significant nausea. Denies any abdominal pain, constipation/diarrhea, or difficulty  chewing/swallowing. Patient reports she is familiar with diet for heart failure, but has not received formal education on it before. She reports one of her nephrologists told her to limit fluid to 2 L per day.   Reports UBW was 178 lbs. However noted in chart that patient was 189.5 lbs on 12/19/2016. RD obtained bed scale weight of 172.7 lbs. Patient has lost 16.8 lbs (8.9% body weight) over the past 2 months, which is significant for time frame. In setting of patient's hx of CKD stage IV and CHF, some of that weight loss may have been related to fluid.   Medications reviewed and include: fenofibrate 54 mg daily, ferrous sulfate 325 mg daily, Lasix 40 mg BID, Novolog 0-9 units TID, Novolog 0-5 units QHS, pantoprazole, sertraline, vancomycin.  Labs reviewed: CBG 135, Chloride 95, BUN 47, Creatinine 1.8, eGFR 25.   Nutrition-Focused physical exam completed. Findings are mild fat depletion in orbital region, moderate muscle depletion in temple region, and mild edema. Difficult to truly assess muscle status due to presence of ample fat tissue.  Patient is at risk for acute malnutrition.  Diet Order:  Diet Carb Modified Fluid consistency: Thin; Room service appropriate? Yes  Skin:  Wound (see comment) (closed incision left leg)  Last BM:  PTA (02/26/2017 per chart)  Height:   Ht Readings from Last 1 Encounters:  02/27/17 5' 4" (1.626 m)    Weight:   Wt Readings from Last 1 Encounters:  02/27/17 172 lb 11.2 oz (78.3 kg)    Ideal Body Weight:  54.5 kg  BMI:  Body mass index is 29.64 kg/m.  Estimated Nutritional Needs:   Kcal:  1490-1735 (MSJ x 1.2-1.4)  Protein:  80-95 grams (1-1.2 grams/kg)  Fluid:  2 L/day (25 ml/kg)  EDUCATION NEEDS:   Education needs addressed  Willey Blade, MS, RD, LDN Pager: [phone] After Hours Pager: [phone]

## 2017-02-27 NOTE — Telephone Encounter (Signed)
Pt currently in ED to be admitted for CHF & pneumonia.

## 2017-02-27 NOTE — ED Triage Notes (Addendum)
Pt arrives to ED from home via [facility] with c/o SHOB, weakness, and pedal edema x1 week. Pt was d/c'd from [facility] in the last few days, but pt states she's felt weak, SHOB, and noticed increased BLE swelling for a few days. Pt is A&O, in NAD, with RR even, regular, and unlabored. EMS reports pt uses chronic O2 at home (2L via [place]). Pt also with implanted pacemaker; EMS VS: BP 180/120, RR 22, CBG 111, HR 95-100 BPM.

## 2017-02-27 NOTE — Evaluation (Signed)
Physical Therapy Evaluation Patient Details Name: Regina Burke MRN: [id number] DOB: 1933-06-28 Today's Date: 02/27/2017   History of Present Illness  83 y/o female presents to ED with SOB, weakness, and edema in all 4 extremeties. Pt was just dc home a couple days ago from [place] s/p a fall and L hip fx with surgical pinning surgery on 02/01/17. PMH includes CHF, COPD (O2 dependent), DM, HTN, a-fib, chronic orthopnea, UTI, anemia, pacemaker, CKD, CAD, diverticulitis, Cushing's, depression, gout, HOH, lung cancer, and MI.    Clinical Impression  Pt is a pleasant 83 year old admitted for acute on chronic heart failure. Pt performs bed mobility with min guard for supine to sit and min-mod assist for sit to supine for LE management. Pt performs stand pivot transfer to [facility] to/from EOB with min-mod assist (no AD used = hand held assist provided). Ambulation deferred due to pt's request to remain in bed after transferring to/from [facility]. This session performed on 2L O2 and her SAO2 remained above 88%.  Pt demonstrates deficits in pain, activity tolerance, and balance. Would benefit from skilled PT to address above deficits and promote optimal return to PLOF. Pt is motivated to participate in therapy. PT recommends dc to SNF ([facility]) to address the above deficits.     Follow Up Recommendations SNF ([facility])    Equipment Recommendations  3in1 (PT)    Recommendations for Other Services       Precautions / Restrictions Precautions Precautions: Fall;ICD/Pacemaker Restrictions Weight Bearing Restrictions: No      Mobility  Bed Mobility Overal bed mobility: Needs Assistance Bed Mobility: Supine to Sit;Sit to Supine     Supine to sit: Min guard Sit to supine: Min assist;Mod assist   General bed mobility comments: Min guard for supine to EOB - good technique performed with BUE pull on handrail. Once EOB, no dizziness and O2 remained above 88% on 2L O2. Sit to supine required min-mod assist for  LE management. Pt able to scoot to [facility] with HOB lowered and BUE pull on handrails.  Transfers Overall transfer level: Needs assistance Equipment used: 1 person hand held assist Transfers: Stand Pivot Transfers   Stand pivot transfers: Min assist;Mod assist       General transfer comment: Transferred EOB to/from [facility] with min-mod assist and verbal cueing for hand placement. Pt mildly unsteady on her feet, but able to maintain safe technique with hand held assist. Pt able to perform toilet hygeine and clothing management with supervision.  Ambulation/Gait             General Gait Details: Deferred to next session due to pt refusal to mobilize out of bed again (after [facility] transfer). Pt states she is willing to try tomorrow.  Stairs            Wheelchair Mobility    Modified Rankin (Stroke Patients Only)       Balance Overall balance assessment: Needs assistance;History of Falls     Sitting balance - Comments: Sat EOB for several minutes during history and strength testing. O2 remained above 90% on 2L O2. Could maintain seated balance with BUE's in lap.       Standing balance comment: Stood with min assist and BUE support (1 hand on PT, other on BSC or bed rail). Pt mildly unsteady on feet.                              Pertinent  Vitals/Pain Pain Assessment: 0-10 Pain Score: 8  Pain Location: L hip Pain Descriptors / Indicators: Grimacing;Guarding;Operative site guarding Pain Intervention(s): Limited activity within patient's tolerance;Monitored during session;Repositioned    Home Living Family/patient expects to be discharged to:: Private residence Living Arrangements: Children Available Help at Discharge: Family;Available 24 hours/day Type of Home: House Home Access: Stairs to enter Entrance Stairs-Rails: None Entrance Stairs-Number of Steps: 3 Home Layout: One level Home Equipment: Walker - 2 wheels;Cane - single point;Shower seat;Grab bars -  tub/shower;Walker - 4 wheels;Other (comment);Wheelchair - manual      Prior Function Level of Independence: Needs assistance         Comments: Prior to last hospital admission (02/01/17 for hip fx and subsequent surgery), pt was independent in all ADL's and did not use an AD to ambulate. After last admission, pt was dc to [facility], where she mobilized with a manual w/c. Pt was just dc home with her son 2 days ago. She continued using a manual w/c for mobility, but had to use a RW in the bathroom due to accessibility into this room. Pt states this was very difficult for her to perform independently.      Hand Dominance        Extremity/Trunk Assessment   Upper Extremity Assessment Upper Extremity Assessment: Generalized weakness (4/5 for elbow flex/ext and grip B)    Lower Extremity Assessment Lower Extremity Assessment: Generalized weakness (4/5 RLE for knee flex/ext and DF/PF, did not test LLE (pain))       Communication   Communication: HOH  Cognition Arousal/Alertness: Awake/alert Behavior During Therapy: WFL for tasks assessed/performed Overall Cognitive Status: Within Functional Limits for tasks assessed                                        General Comments      Exercises Other Exercises Other Exercises: Seated ther-ex x10 B included: LAQ's, marches (RLE only). Supine ther-ex x10 B included: hip abd, SLR's, ankle pumps, and glute squeezes. Performed with supervision and verbal cueing for sequencing/encouragement.   Assessment/Plan    PT Assessment Patient needs continued PT services  PT Problem List Decreased strength;Decreased activity tolerance;Decreased balance;Pain       PT Treatment Interventions Stair training;Gait training;Therapeutic activities;Therapeutic exercise;Balance training;Patient/family education    PT Goals (Current goals can be found in the Care Plan section)  Acute Rehab PT Goals Patient Stated Goal: to go home and be  independent again PT Goal Formulation: With patient Time For Goal Achievement: 03/13/17 Potential to Achieve Goals: Good    Frequency Min 2X/week   Barriers to discharge        Co-evaluation               AM-PAC PT "6 Clicks" Daily Activity  Outcome Measure Difficulty turning over in bed (including adjusting bedclothes, sheets and blankets)?: A Little Difficulty moving from lying on back to sitting on the side of the bed? : Total Difficulty sitting down on and standing up from a chair with arms (e.g., wheelchair, bedside commode, etc,.)?: Total Help needed moving to and from a bed to chair (including a wheelchair)?: A Little Help needed walking in hospital room?: A Lot Help needed climbing 3-5 steps with a railing? : Total 6 Click Score: 11    End of Session Equipment Utilized During Treatment: Gait belt;Oxygen Activity Tolerance: Patient limited by fatigue;Patient limited by pain Patient left:  in bed;with call bell/phone within reach;with bed alarm set Nurse Communication: Mobility status PT Visit Diagnosis: Unsteadiness on feet (R26.81);Other abnormalities of gait and mobility (R26.89);History of falling (Z91.81);Muscle weakness (generalized) (M62.81);Pain Pain - Right/Left: Left Pain - part of body: Hip    Time: 7356-7014 PT Time Calculation (min) (ACUTE ONLY): 25 min   Charges:         PT G Codes:        Donaciano Eva, PT, SPT  Airica Schwartzkopf 02/27/2017, 3:03 PM

## 2017-02-27 NOTE — Plan of Care (Signed)
Problem: Food- and Nutrition-Related Knowledge Deficit (NB-1.1) Goal: Nutrition education Formal process to instruct or train a patient/client in a skill or to impart knowledge to help patients/clients voluntarily manage or modify food choices and eating behavior to maintain or improve health. Outcome: Completed/Met Date Met: 02/27/17 Nutrition Education Note  RD consulted for nutrition education regarding new onset CHF.  RD provided "Heart Failure Nutrition Therapy" handout from the Academy of [employer]etics. Reviewed patient's dietary recall. Provided examples on ways to decrease sodium intake in diet. Discouraged intake of processed foods and use of salt shaker. Encouraged fresh fruits and vegetables as well as whole grain sources of carbohydrates to maximize fiber intake.   RD discussed why it is important for patient to adhere to diet recommendations, and emphasized the role of fluids, foods to avoid, and importance of weighing self daily. Teach back method used.  Expect fair compliance.  Body mass index is 29.64 kg/m. Pt meets criteria for overweight based on current BMI.  Current diet order is Carbohydrate Modified, patient is consuming approximately 50% of meals at this time or less per pt report.   Willey Blade, MS, RD, LDN Pager: [phone] After Hours Pager: [phone]

## 2017-02-27 NOTE — ED Notes (Addendum)
Right sided EJ placed by Dr. Reita Cliche removed by this RN because it was unable to flush or pull blood back.   Pt stuck for peripheral IV X 4 on prior shift per report from [place], RN, unsuccessful.

## 2017-02-27 NOTE — Progress Notes (Signed)
Dr Bridgett Larsson made aware that pt having a constant visible left leg (under knee) muscle spasm, pt tried massage and repositioning with no effectiveness, MD made aware of pts history and recent hip fracture/surgery, new order for flexeril PRN

## 2017-02-27 NOTE — H&P (Signed)
[facility] [place] NAME: Regina Burke    MR#:  [id number]  DATE OF BIRTH:  [birth year]  DATE OF ADMISSION:  02/27/2017  PRIMARY CARE PHYSICIAN: Tracie Harrier, MD   REQUESTING/REFERRING PHYSICIAN: Dr. Reita Cliche  CHIEF COMPLAINT:   Chief Complaint  Patient presents with  . Shortness of Breath  . Weakness    HISTORY OF PRESENT ILLNESS:  Regina Burke  is a 83 y.o. female with a known history of D CHF, COPD, DM, HTN recent hip fracture, Afib, not on Eliquis due to GI bleed here with SOB and worsening LE eema. Has chronic orthopnea. Here CXR shows LLL PNA and pulm edema. Recently discharge from [place] back home. Also has dysuria and UTI on UA  PAST MEDICAL HISTORY:   Past Medical History:  Diagnosis Date  . Anemia   . Arthritis   . Asthma   . Cardiac resynchronization therapy pacemaker (CRT-P) in place    a. 03/31/16:  Medtronic Percepta Quad CRT-P MRI SureScan (serial Number [id number]) device.  . Chronic diastolic CHF (congestive heart failure) ([place])    a. 10/2015 Echo: EF 55-65%, Gr1 DD, mild MR, mildly dil LA, nl RV fxn, nl PASP;  b. 03/2016 Echo: EF 30-35%, mod MR, mod dil LA; c. 12/2016 Echo: EF 60-65%, no rwma, mild AS, mildly dil LA, PASP 38mmHg.  . CKD (chronic kidney disease), stage IV ([place])   . COPD (chronic obstructive pulmonary disease) ([place])   . Coronary artery disease    a. 11/2014 NSTEMI/PCI: LM nl, LAD 74m, D1 30, LCX mild dzs, OM1 20p, OM2 12m, OM3 90p (2.25x8 Promus Premier DES), RCA nl.   . Cough    CHRONIC AT NIGHT  . Cushing's disease ([place])   . Depression   . Diverticulitis   . Edema    FEET/LEGS  . GERD (gastroesophageal reflux disease)   . Gout   . History of hiatal hernia   . History of pneumonia   . HLD (hyperlipidemia)   . HOH (hard of hearing)   . Hypertensive heart disease   . Lung cancer ([place]) dx'd 2014   S/P radiation 2015  . Migraine   . Mixed Ischemic and Nonischemic Cardiomyopathy ([place])    a.  03/2016 Echo: EF 30-35%;  b. 12/2016 Echo: EF 60-65%.  . Moderate mitral regurgitation   . Multiple allergies   . Myocardial infarction ([place])   . Oxygen deficiency    2LITERS  . Persistent atrial fibrillation ([place])    a. CHADS2VASc ==> 7 (CHF, HTN, age x 2, DM, vascular disease, and gender)--was on renal dosed eliquis but this was d/c'd in 12/2016 in setting of dark stools/anemia;  c. 03/2016 s/p AVN and MDT BiV ICD placement.  . Presence of permanent cardiac pacemaker   . S/P AV nodal ablation    a. on 03/31/16 for persistant afib with CRT-P placement  . Sleep apnea   . Type II diabetes mellitus ([place])     PAST SURGICAL HISTORY:   Past Surgical History:  Procedure Laterality Date  . ABDOMINAL HYSTERECTOMY    . ABLATION     July 2017  . ADRENALECTOMY Left 1980's   "[place]"  . APPENDECTOMY    . BREAST CYST EXCISION Left   . CATARACT EXTRACTION W/PHACO Right 12/28/2015   Procedure: CATARACT EXTRACTION PHACO AND INTRAOCULAR LENS PLACEMENT (IOC);  Surgeon: Birder Robson, MD;  Location: [facility] OR[facility];  Service: Ophthalmology;  Laterality: Right;  [place] 48.4   .  CATARACT EXTRACTION W/PHACO Left 11/14/2016   Procedure: CATARACT EXTRACTION PHACO AND INTRAOCULAR LENS PLACEMENT (IOC);  Surgeon: Birder Robson, MD;  Location: [facility];  Service: Ophthalmology;  Laterality: Left;  [place] 59.8 AP% 18.1 CDE 10.78 Fluid pack lot # [id number]  . CHOLECYSTECTOMY    . CORONARY ANGIOPLASTY WITH STENT PLACEMENT  12/23/2014  . ELECTROPHYSIOLOGIC STUDY N/A 03/08/2016   Procedure: CARDIOVERSION;  Surgeon: Wende Bushy, MD;  Location: [facility];  Service: Cardiovascular;  Laterality: N/A;  . ELECTROPHYSIOLOGIC STUDY N/A 03/07/2016   Procedure: Cardioversion;  Surgeon: Wende Bushy, MD;  Location: [facility];  Service: Cardiovascular;  Laterality: N/A;  . ELECTROPHYSIOLOGIC STUDY N/A 03/31/2016   Procedure: AV Node Ablation;  Surgeon: Will Meredith Leeds, MD;  Location: [place] [facility];  Service: Cardiovascular;   Laterality: N/A;  . EP IMPLANTABLE DEVICE N/A 03/31/2016   Procedure: BiV Pacemaker Insertion CRT-P;  Surgeon: Will Meredith Leeds, MD;  Location: [place] [facility];  Service: Cardiovascular;  Laterality: N/A;  . ESOPHAGOGASTRODUODENOSCOPY (EGD) WITH PROPOFOL N/A 01/26/2017   Procedure: ESOPHAGOGASTRODUODENOSCOPY (EGD) WITH PROPOFOL;  Surgeon: [name], MD;  Location: [facility] ENDOSCOPY;  Service: Endoscopy;  Laterality: N/A;  . FRACTURE SURGERY    . HIP PINNING,CANNULATED Left 02/01/2017   Procedure: CANNULATED HIP PINNING;  Surgeon: Lovell Sheehan, MD;  Location: [facility];  Service: Orthopedics;  Laterality: Left;  . INSERT / REPLACE / REMOVE PACEMAKER  02/2016  . LEFT HEART CATHETERIZATION WITH CORONARY ANGIOGRAM N/A 12/23/2014   Procedure: LEFT HEART CATHETERIZATION WITH CORONARY ANGIOGRAM;  Surgeon: Burnell Blanks, MD;  Location: [facility] CATH LAB;  Service: Cardiovascular;  Laterality: N/A;  . PERCUTANEOUS CORONARY STENT INTERVENTION (PCI-S)  12/23/2014   Procedure: PERCUTANEOUS CORONARY STENT INTERVENTION (PCI-S);  Surgeon: Burnell Blanks, MD;  Location: [facility] CATH LAB;  Service: Cardiovascular;;  Promus 2.25x8  . TONSILLECTOMY    . TRANSTHORACIC ECHOCARDIOGRAM  11/26/2015   Technically difficult study. EF 55-60%. Normal wall motion. GR 1 DD.  . TUBAL LIGATION    . WRIST FRACTURE SURGERY Bilateral ~ 2000    SOCIAL HISTORY:   Social History  Substance Use Topics  . Smoking status: Former Smoker    Packs/day: 1.00    Years: 45.00    Types: Cigarettes    Quit date: 04/25/1994  . Smokeless tobacco: Never Used  . Alcohol use No     Comment: 12/23/2014 "might have a couple mixed drinks/year"    FAMILY HISTORY:   Family History  Problem Relation Age of Onset  . Heart disease Mother   . Diabetes Mother   . Osteoarthritis Mother   . Hypertension Mother   . Heart disease Father   . Hypertension Father   . COPD Brother     DRUG ALLERGIES:   Allergies  Allergen Reactions   . Ciprofloxacin Shortness Of Breath, Itching and Rash  . Doxycycline Shortness Of Breath, Itching and Rash  . Penicillins Shortness Of Breath, Itching, Rash and Other (See Comments)    Has patient had a PCN reaction causing immediate rash, facial/tongue/throat swelling, SOB or lightheadedness with hypotension: Yes Has patient had a PCN reaction causing severe rash involving mucus membranes or skin necrosis: No Has patient had a PCN reaction that required hospitalization No Has patient had a PCN reaction occurring within the last 10 years: No If all of the above answers are "NO", then may proceed with Cephalosporin use.  . Sulfa Antibiotics Shortness Of Breath, Itching and Rash  . Latex Itching  . Morphine And  Related Itching  . Albuterol Sulfate   . Cefuroxime Rash    Blisters in mouth    REVIEW OF SYSTEMS:   Review of Systems  Constitutional: Positive for chills, fever and malaise/fatigue. Negative for weight loss.  HENT: Negative for hearing loss and nosebleeds.   Eyes: Negative for blurred vision, double vision and pain.  Respiratory: Positive for cough, sputum production and shortness of breath. Negative for hemoptysis and wheezing.   Cardiovascular: Positive for orthopnea and leg swelling. Negative for chest pain and palpitations.  Gastrointestinal: Negative for abdominal pain, constipation, diarrhea, nausea and vomiting.  Genitourinary: Negative for dysuria and hematuria.  Musculoskeletal: Positive for joint pain. Negative for back pain, falls and myalgias.  Skin: Negative for rash.  Neurological: Positive for weakness. Negative for dizziness, tremors, sensory change, speech change, focal weakness, seizures and headaches.  Endo/Heme/Allergies: Does not bruise/bleed easily.  Psychiatric/Behavioral: Negative for depression and memory loss. The patient is not nervous/anxious.     MEDICATIONS AT HOME:   Prior to Admission medications   Medication Sig Start Date End Date  Taking? Authorizing Provider  ALPRAZolam Duanne Moron) 0.5 MG tablet Take 0.5 mg by mouth at bedtime. 12/27/16  Yes [provider]  atorvastatin (LIPITOR) 20 MG tablet Take 1 tablet (20 mg total) by mouth every evening. 12/03/15  Yes Gollan, Kathlene November, MD  cholecalciferol (VITAMIN D) 400 UNITS TABS tablet Take 400 Units by mouth daily.    Yes [provider]  cyanocobalamin 500 MCG tablet Take 500 mcg by mouth daily.    Yes [provider]  fenofibrate (TRICOR) 48 MG tablet Take 48 mg by mouth every evening.    Yes [provider]  ferrous sulfate 325 (65 FE) MG EC tablet Take 325 mg by mouth daily with breakfast.    Yes [provider]  furosemide (LASIX) 40 MG tablet Take 1 tablet (40 mg total) by mouth daily. 04/01/16  Yes Eileen Stanford, PA-C  metoprolol succinate (TOPROL XL) 25 MG 24 hr tablet Take 0.5 tablets (12.5 mg total) by mouth daily. 12/19/16  Yes Minna Merritts, MD  Multiple Vitamins-Minerals (PRESERVISION AREDS 2) CAPS Take 1 capsule by mouth daily.   Yes [provider]  pantoprazole (PROTONIX) 40 MG tablet Take 40 mg by mouth every evening.    Yes [provider]  sertraline (ZOLOFT) 50 MG tablet Take 1 tablet (50 mg total) by mouth daily. 03/24/16  Yes Gladstone Lighter, MD  fluticasone furoate-vilanterol (BREO ELLIPTA) 100-25 MCG/INH AEPB Inhale 1 puff into the lungs daily as needed.     [provider]  nitroGLYCERIN (NITROSTAT) 0.4 MG SL tablet Place 1 tablet (0.4 mg total) under the tongue every 5 (five) minutes as needed for chest pain. 12/24/14   Barrett, Evelene Croon, PA-C  tiotropium (SPIRIVA) 18 MCG inhalation capsule Place 18 mcg into inhaler and inhale daily as needed.     [provider]  traMADol (ULTRAM) 50 MG tablet Take 50 mg by mouth every 6 (six) hours as needed.    [provider]     VITAL SIGNS:  Blood pressure (!) 171/50, pulse 72, temperature 99.2 F (37.3 C), temperature  source Oral, resp. rate (!) 22, height 5\' 4"  (1.626 m), weight 81.2 kg (179 lb), SpO2 100 %.  PHYSICAL EXAMINATION:  Physical Exam  GENERAL:  83 y.o.-year-old patient lying in the bed with no acute distress.  EYES: Pupils equal, round, reactive to light and accommodation. No scleral icterus. Extraocular muscles intact.  HEENT: Head atraumatic, normocephalic. Oropharynx and nasopharynx clear. No oropharyngeal erythema, moist oral mucosa  NECK:  Supple, no jugular venous distention. No thyroid enlargement, no tenderness.  LUNGS: LLL crackles. Poor air entry CARDIOVASCULAR: S1, S2 normal. No murmurs, rubs, or gallops.  ABDOMEN: Soft, nontender, nondistended. Bowel sounds present. No organomegaly or mass.  EXTREMITIES: No pedal edema, cyanosis, or clubbing. + 2 pedal & radial pulses b/l.   NEUROLOGIC: Cranial nerves II through XII are intact. No focal Motor or sensory deficits appreciated b/l PSYCHIATRIC: The patient is alert and oriented x 3. Good affect.  SKIN: No obvious rash, lesion, or ulcer.   LABORATORY PANEL:   CBC  Recent Labs Lab 02/27/17 0722  WBC 15.3*  HGB 7.9*  HCT 25.1*  PLT 254   ------------------------------------------------------------------------------------------------------------------  Chemistries   Recent Labs Lab 02/27/17 0722  NA 138  K 4.0  CL 95*  CO2 31  GLUCOSE 142*  BUN 47*  CREATININE 1.80*  CALCIUM 8.3*   ------------------------------------------------------------------------------------------------------------------  Cardiac Enzymes  Recent Labs Lab 02/27/17 0722  TROPONINI <0.03   ------------------------------------------------------------------------------------------------------------------  RADIOLOGY:  Dg Chest 2 View  Result Date: 02/27/2017 CLINICAL DATA:  Shortness of breath, weakness, pedal edema for a week EXAM: CHEST  2 VIEW COMPARISON:  Chest x-ray of 01/31/2017 FINDINGS: Moderate cardiomegaly is again noted and  there is now more opacity at the left lung base, consistent with left pleural effusion and left basilar atelectasis. Pneumonia would be difficult to exclude. There is mild pulmonary vascular congestion present and permanent pacemaker remains. These changes may all be due to congestive heart failure, but superimposed pneumonia at the left lung base cannot be excluded. No bony abnormality is seen. IMPRESSION: 1. Increased opacity at the left lung base most consistent with left pleural effusion, left basilar atelectasis, and possibly left basilar pneumonia. 2. Cardiomegaly and mild pulmonary vascular congestion. Electronically Signed   By: Ivar Drape M.D.   On: 02/27/2017 08:18     IMPRESSION AND PLAN:   * LLL HCAPneumonia with acute on chronic hypoxic respiratory failure Vancomycin/Aztreonam. Check MRSA PCR Cx pending  * Acute on chronic diastolic chf - IV Lasix, Beta blockers - Input and Output - Counseled to limit fluids and Salt - Monitor Bun/Cr and Potassium - Echo -Cardiology follow up after discharge  * DM2 Home meds and SSI  * CKD3 Stable Monitor while being diuresed  * HTN Continue home meds  * COPD No wheezing   All the records are reviewed and case discussed with ED provider. Management plans discussed with the patient, family and they are in agreement.  CODE STATUS: DNR  TOTAL TIME TAKING CARE OF THIS PATIENT: 40 minutes.   Hillary Bow R M.D on 02/27/2017 at 9:04 AM  Between 7am to 6pm - Pager - [phone]  After 6pm go to [email or web address] - password [email or web address] [facility]  Office  [phone]  CC: Primary care physician; Tracie Harrier, MD  Note: This dictation was prepared with Dragon dictation along with smaller phrase technology. Any transcriptional errors that result from this process are unintentional.

## 2017-02-27 NOTE — ED Notes (Addendum)
Pt assisted up to bedside commode to urinate. Back in bed at this time. Pt assist X 1.

## 2017-02-27 NOTE — ED Notes (Signed)
Lab phlebotomist to come and help with blood collection. Verbal orders from Dr. Reita Cliche to start antibiotics prior to blood culture collection.

## 2017-02-27 NOTE — ED Notes (Signed)
Pt back from xray at this time. Family at bedside.

## 2017-02-27 NOTE — Consult Note (Signed)
Pharmacy Antibiotic Note  Regina Burke is a 83 y.o. female admitted on 02/27/2017 with pneumonia.  Pharmacy has been consulted for aztreonam and vancomycin dosing. Pt w/ multiple abx allergies Patient was given 1 g of Vancomycin in the ED. Will give next dose in 10 hours for stacked dosing Plan:Vancomycin 1000mg  IV every 36 hours.  Goal trough 15-20 mcg/mL.  Aztreonam 1g q 8 hours vanc trough prior to the 5th total dose. Monitor renal fx closely   Height: 5\' 4"  (162.6 cm) Weight: 179 lb (81.2 kg) IBW/kg (Calculated) : 54.7  Temp (24hrs), Avg:99.2 F (37.3 C), Min:99.2 F (37.3 C), Max:99.2 F (37.3 C)   Recent Labs Lab 02/27/17 0722 02/27/17 0858  WBC 15.3*  --   CREATININE 1.80*  --   LATICACIDVEN  --  0.8    Estimated Creatinine Clearance: 25.3 mL/min (A) (by C-G formula based on SCr of 1.8 mg/dL (H)).    Allergies  Allergen Reactions  . Ciprofloxacin Shortness Of Breath, Itching and Rash  . Doxycycline Shortness Of Breath, Itching and Rash  . Penicillins Shortness Of Breath, Itching, Rash and Other (See Comments)    Has patient had a PCN reaction causing immediate rash, facial/tongue/throat swelling, SOB or lightheadedness with hypotension: Yes Has patient had a PCN reaction causing severe rash involving mucus membranes or skin necrosis: No Has patient had a PCN reaction that required hospitalization No Has patient had a PCN reaction occurring within the last 10 years: No If all of the above answers are "NO", then may proceed with Cephalosporin use.  . Sulfa Antibiotics Shortness Of Breath, Itching and Rash  . Latex Itching  . Morphine And Related Itching  . Albuterol Sulfate   . Macrobid [Nitrofurantoin Macrocrystal] Itching  . Cefuroxime Rash    Blisters in mouth    Antimicrobials this admission: aztreonam 7/3 >>  vancomycin 7/3 >>   Dose adjustments this admission:   Microbiology results: 7/3 BCx:  7/3 UCx:   7/3 MRSA PCR:   Thank you for allowing  pharmacy to be a part of this patient's care.  Ramond Dial, Pharm.D, BCPS Clinical Pharmacist  02/27/2017 11:44 AM

## 2017-02-28 DIAGNOSIS — J432 Centrilobular emphysema: Secondary | ICD-10-CM

## 2017-02-28 DIAGNOSIS — D631 Anemia in chronic kidney disease: Secondary | ICD-10-CM

## 2017-02-28 DIAGNOSIS — R0603 Acute respiratory distress: Secondary | ICD-10-CM

## 2017-02-28 DIAGNOSIS — J90 Pleural effusion, not elsewhere classified: Secondary | ICD-10-CM

## 2017-02-28 DIAGNOSIS — I5033 Acute on chronic diastolic (congestive) heart failure: Secondary | ICD-10-CM

## 2017-02-28 DIAGNOSIS — N184 Chronic kidney disease, stage 4 (severe): Secondary | ICD-10-CM

## 2017-02-28 LAB — GLUCOSE, CAPILLARY
GLUCOSE-CAPILLARY: 107 mg/dL — AB (ref 65–99)
GLUCOSE-CAPILLARY: 192 mg/dL — AB (ref 65–99)
GLUCOSE-CAPILLARY: 87 mg/dL (ref 65–99)
Glucose-Capillary: 140 mg/dL — ABNORMAL HIGH (ref 65–99)

## 2017-02-28 LAB — BLOOD CULTURE ID PANEL (REFLEXED)
Acinetobacter baumannii: NOT DETECTED
CANDIDA ALBICANS: NOT DETECTED
CANDIDA GLABRATA: NOT DETECTED
CANDIDA KRUSEI: NOT DETECTED
CANDIDA PARAPSILOSIS: NOT DETECTED
CANDIDA TROPICALIS: NOT DETECTED
ENTEROBACTER CLOACAE COMPLEX: NOT DETECTED
ENTEROBACTERIACEAE SPECIES: NOT DETECTED
ESCHERICHIA COLI: NOT DETECTED
Enterococcus species: NOT DETECTED
HAEMOPHILUS INFLUENZAE: NOT DETECTED
KLEBSIELLA OXYTOCA: NOT DETECTED
KLEBSIELLA PNEUMONIAE: NOT DETECTED
Listeria monocytogenes: NOT DETECTED
Methicillin resistance: DETECTED — AB
Neisseria meningitidis: NOT DETECTED
Proteus species: NOT DETECTED
Pseudomonas aeruginosa: NOT DETECTED
STREPTOCOCCUS PYOGENES: NOT DETECTED
Serratia marcescens: NOT DETECTED
Staphylococcus aureus (BCID): NOT DETECTED
Staphylococcus species: DETECTED — AB
Streptococcus agalactiae: NOT DETECTED
Streptococcus pneumoniae: NOT DETECTED
Streptococcus species: NOT DETECTED

## 2017-02-28 LAB — CBC
HCT: 22.7 % — ABNORMAL LOW (ref 35.0–47.0)
Hemoglobin: 7.3 g/dL — ABNORMAL LOW (ref 12.0–16.0)
MCH: 26.4 pg (ref 26.0–34.0)
MCHC: 32.1 g/dL (ref 32.0–36.0)
MCV: 82.2 fL (ref 80.0–100.0)
PLATELETS: 212 10*3/uL (ref 150–440)
RBC: 2.76 MIL/uL — AB (ref 3.80–5.20)
RDW: 19 % — ABNORMAL HIGH (ref 11.5–14.5)
WBC: 11 10*3/uL (ref 3.6–11.0)

## 2017-02-28 LAB — BASIC METABOLIC PANEL
Anion gap: 12 (ref 5–15)
BUN: 43 mg/dL — ABNORMAL HIGH (ref 6–20)
CHLORIDE: 94 mmol/L — AB (ref 101–111)
CO2: 31 mmol/L (ref 22–32)
CREATININE: 1.54 mg/dL — AB (ref 0.44–1.00)
Calcium: 8.2 mg/dL — ABNORMAL LOW (ref 8.9–10.3)
GFR calc non Af Amer: 30 mL/min — ABNORMAL LOW (ref >60)
GFR, EST AFRICAN AMERICAN: 35 mL/min — AB (ref >60)
Glucose, Bld: 104 mg/dL — ABNORMAL HIGH (ref 65–99)
Potassium: 3.9 mmol/L (ref 3.5–5.1)
Sodium: 137 mmol/L (ref 135–145)

## 2017-02-28 LAB — PREPARE RBC (CROSSMATCH)

## 2017-02-28 MED ORDER — SODIUM CHLORIDE 0.9 % IV SOLN
Freq: Once | INTRAVENOUS | Status: AC
  Administered 2017-02-28: 18:00:00 via INTRAVENOUS

## 2017-02-28 MED ORDER — LEVALBUTEROL HCL 1.25 MG/0.5ML IN NEBU
INHALATION_SOLUTION | RESPIRATORY_TRACT | Status: AC
  Administered 2017-02-28: 1.25 mg
  Filled 2017-02-28: qty 0.5

## 2017-02-28 MED ORDER — LEVALBUTEROL HCL 1.25 MG/0.5ML IN NEBU
1.2500 mg | INHALATION_SOLUTION | Freq: Four times a day (QID) | RESPIRATORY_TRACT | Status: DC | PRN
  Administered 2017-03-01 (×2): 1.25 mg via RESPIRATORY_TRACT
  Filled 2017-02-28 (×2): qty 0.5

## 2017-02-28 MED ORDER — FERROUS SULFATE 325 (65 FE) MG PO TABS
325.0000 mg | ORAL_TABLET | Freq: Two times a day (BID) | ORAL | Status: DC
  Administered 2017-02-28 – 2017-03-02 (×4): 325 mg via ORAL
  Filled 2017-02-28 (×4): qty 1

## 2017-02-28 MED ORDER — FUROSEMIDE 10 MG/ML IJ SOLN
60.0000 mg | Freq: Once | INTRAMUSCULAR | Status: AC
  Administered 2017-02-28: 60 mg via INTRAVENOUS
  Filled 2017-02-28: qty 6

## 2017-02-28 NOTE — NC FL2 (Signed)
[place] LEVEL OF CARE SCREENING TOOL     IDENTIFICATION  Patient Name: Regina Burke Birthdate: 16-Feb-1933 Sex: female Admission Date (Current Location): 02/27/2017  [place] and [place] Number:  Engineering geologist and Address:  [facility], [address], [place], [place] [address]      Provider Number: [id number]  Attending Physician Name and Address:  Fritzi Mandes, MD  Relative Name and Phone Number:       Current Level of Care: Hospital Recommended Level of Care: [place] Prior Approval Number:    Date Approved/Denied:   PASRR Number:  [id number] )  Discharge Plan: SNF    Current Diagnoses: Patient Active Problem List   Diagnosis Date Noted  . Pneumonia 02/27/2017  . S/p left hip fracture 01/31/2017  . Pre-operative cardiovascular examination 01/31/2017  . GI bleed   . Other diseases of stomach and duodenum   . CHF, acute on chronic ([place]) 01/23/2017  . Chest pain 01/12/2017  . Dizziness 08/25/2016  . LBBB (left bundle branch block) 04/01/2016  . S/P AV nodal ablation   . Cardiac resynchronization therapy pacemaker (CRT-P) in place   . Persistent atrial fibrillation ([place]): CHA2DS2-Vasc = ~7. On Xarelto 15 mg (age & renal Fxn) 03/18/2016  . Congestive dilated cardiomyopathy ([place]) 03/18/2016    Class: Temporary  . DNR (do not resuscitate) discussion   . Palliative care encounter   . Persistent atrial fibrillation ([place])   . Chronic diastolic CHF (congestive heart failure) ([place]) 02/16/2016  . HLD (hyperlipidemia) 02/16/2016  . Iron deficiency anemia due to chronic blood loss   . Essential hypertension   . Dyspnea 11/25/2015  . Diaphoresis   . Renal insufficiency   . Centrilobular emphysema ([place])   . Chronic obstructive pulmonary disease ([place]) 07/14/2015  . Status post thoracentesis   . S/P thoracentesis   . H/O: lung cancer   . Pleural effusion   . Coronary artery disease involving native  coronary artery of native heart without angina pectoris   . S/P coronary artery stent placement   . Pressure ulcer 06/08/2015  . Dehydration 05/09/2015  . Diabetes ([place]) 05/09/2015  . Closed fracture nasal bone 05/09/2015  . Cancer of upper lobe of right lung ([place]) 01/08/2015  . Allergic state 01/08/2015  . Anemia associated with chronic renal failure 01/08/2015  . Chronic kidney disease 01/08/2015  . CAFL (chronic airflow limitation) ([place]) 01/08/2015  . Arthritis, degenerative 01/08/2015  . 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 Diabetes mellitus, type 2 ([place]) 09/11/2014  . Type 2 diabetes mellitus ([place]) 09/11/2014    Orientation RESPIRATION BLADDER Height & Weight     Self, Time, Situation, Place  O2 (2 Liters Oxygen ) Continent Weight: 169 lb (76.7 kg) Height:  5\' 4"  (162.6 cm)  BEHAVIORAL SYMPTOMS/MOOD NEUROLOGICAL BOWEL NUTRITION STATUS   (none)  (none) Continent Diet (Diet: Carb Modified )  AMBULATORY STATUS COMMUNICATION OF NEEDS Skin   Extensive Assist Verbally Surgical wounds (02/01/17: Incision: Left Leg. )                       Personal Care Assistance Level of Assistance  Bathing, Feeding, Dressing Bathing Assistance: Limited assistance Feeding assistance: Independent Dressing Assistance: Limited assistance     Functional Limitations Info  Sight, Hearing, Speech Sight Info: Adequate Hearing Info: Adequate Speech Info: Adequate    SPECIAL CARE FACTORS FREQUENCY  PT (By licensed PT), OT (By licensed OT)     PT Frequency:  (  5) OT Frequency:  (5)            Contractures      Additional Factors Info  Code Status, Allergies, Insulin Sliding Scale Code Status Info:  (DNR ) Allergies Info:  (Ciprofloxacin, Doxycycline, Penicillins, Sulfa Antibiotics, Latex, Morphine And Related, Albuterol Sulfate, Macrobid Nitrofurantoin Macrocrystal, Cefuroxime)   Insulin Sliding Scale Info:  (NovoLog Insulin Injections. )       Current Medications  (02/28/2017):  This is the current hospital active medication list Current Facility-Administered Medications  Medication Dose Route Frequency Provider Last Rate Last Dose  . acetaminophen (TYLENOL) tablet 650 mg  650 mg Oral Q6H PRN Hillary Bow, MD       Or  . acetaminophen (TYLENOL) suppository 650 mg  650 mg Rectal Q6H PRN Sudini, Alveta Heimlich, MD      . ALPRAZolam Duanne Moron) tablet 0.5 mg  0.5 mg Oral QHS Hillary Bow, MD   0.5 mg at 02/27/17 2047  . atorvastatin (LIPITOR) tablet 20 mg  20 mg Oral QPM Hillary Bow, MD   20 mg at 02/27/17 1658  . azithromycin (ZITHROMAX) tablet 500 mg  500 mg Oral Daily Hillary Bow, MD   500 mg at 02/28/17 0739  . cyclobenzaprine (FLEXERIL) tablet 5 mg  5 mg Oral TID PRN Demetrios Loll, MD   5 mg at 02/27/17 2049  . feeding supplement (ENSURE ENLIVE) (ENSURE ENLIVE) liquid 237 mL  237 mL Oral TID WC Hillary Bow, MD   237 mL at 02/28/17 0740  . fenofibrate tablet 54 mg  54 mg Oral Daily Hillary Bow, MD   54 mg at 02/28/17 0739  . ferrous sulfate tablet 325 mg  325 mg Oral BID WC Fritzi Mandes, MD      . fluticasone furoate-vilanterol (BREO ELLIPTA) 100-25 MCG/INH 1 puff  1 puff Inhalation Daily PRN Sudini, Srikar, MD      . furosemide (LASIX) injection 40 mg  40 mg Intravenous BID Hillary Bow, MD   40 mg at 02/28/17 0739  . insulin aspart (novoLOG) injection 0-5 Units  0-5 Units Subcutaneous QHS Sudini, Srikar, MD      . insulin aspart (novoLOG) injection 0-9 Units  0-9 Units Subcutaneous TID WC Hillary Bow, MD   1 Units at 02/28/17 0756  . meropenem (MERREM) 500 mg in sodium chloride 0.9 % 50 mL IVPB  500 mg Intravenous Q12H Hillary Bow, MD   Stopped at 02/28/17 0636  . metoprolol succinate (TOPROL-XL) 24 hr tablet 12.5 mg  12.5 mg Oral Daily Sudini, Alveta Heimlich, MD   12.5 mg at 02/28/17 0739  . nitroGLYCERIN (NITROSTAT) SL tablet 0.4 mg  0.4 mg Sublingual Q5 min PRN Sudini, Alveta Heimlich, MD      . ondansetron (ZOFRAN) tablet 4 mg  4 mg Oral Q6H PRN Sudini,  Alveta Heimlich, MD       Or  . ondansetron (ZOFRAN) injection 4 mg  4 mg Intravenous Q6H PRN Sudini, Srikar, MD      . pantoprazole (PROTONIX) EC tablet 40 mg  40 mg Oral QPM Hillary Bow, MD   40 mg at 02/27/17 1658  . polyethylene glycol (MIRALAX / GLYCOLAX) packet 17 g  17 g Oral Daily PRN Sudini, Alveta Heimlich, MD      . sertraline (ZOLOFT) tablet 50 mg  50 mg Oral Daily Hillary Bow, MD   50 mg at 02/28/17 0739  . sodium chloride flush (NS) 0.9 % injection 3 mL  3 mL Intravenous Q12H Hillary Bow, MD   3 mL at 02/28/17  0740  . tiotropium (SPIRIVA) inhalation capsule 18 mcg  18 mcg Inhalation Daily PRN Sudini, Alveta Heimlich, MD      . traMADol Veatrice Bourbon) tablet 50 mg  50 mg Oral Q6H PRN Hillary Bow, MD   50 mg at 02/27/17 2047     Discharge Medications: Please see discharge summary for a list of discharge medications.  Relevant Imaging Results:  Relevant Lab Results:   Additional Information  (SSN: [id number])  Adisynn Suleiman, Veronia Beets, LCSW

## 2017-02-28 NOTE — Progress Notes (Signed)
[facility]  ROUNDING NOTE   Subjective:   [name] admitted to [facility] on 02/27/2017 for Shortness of breath [R06.02] Acute on chronic heart failure, unspecified heart failure type ([place]) [I50.9] Pneumonia of left lower lobe due to infectious organism [facility]) [J18.1]  Patient's daughter and son-in-law are at bedside.   Objective:  Vital signs in last 24 hours:  Temp:  [98 F (36.7 C)-98.5 F (36.9 C)] 98.3 F (36.8 C) (07/04 1209) Pulse Rate:  [69-71] 71 (07/04 1209) Resp:  [19-20] 20 (07/04 1209) BP: (146-165)/(40-63) 165/63 (07/04 1209) SpO2:  [96 %-99 %] 99 % (07/04 1209) Weight:  [76.7 kg (169 lb)-78.3 kg (172 lb 11.2 oz)] 76.7 kg (169 lb) (07/04 0500)  Weight change: -2.858 kg (-6 lb 4.8 oz) Filed Weights   02/27/17 0639 02/27/17 1301 02/28/17 0500  Weight: 81.2 kg (179 lb) 78.3 kg (172 lb 11.2 oz) 76.7 kg (169 lb)    Intake/Output: I/O last 3 completed shifts: In: 150 [IV Piggyback:150] Out: -    Intake/Output this shift:  Total I/O In: -  Out: 700 [Urine:700]  Physical Exam: General: NAD, laying in bed  Head: Normocephalic, atraumatic. Moist oral mucosal membranes  Eyes: Anicteric, PERRL  Neck: Supple, trachea midline  Lungs:  Bilateral basilar crackles  Heart: Regular rate and rhythm  Abdomen:  Soft, nontender,   Extremities: no peripheral edema.  Neurologic: Nonfocal, moving all four extremities  Skin: No lesions       Basic Metabolic Panel:  Recent Labs Lab 02/27/17 0722 02/28/17 0543  NA 138 137  K 4.0 3.9  CL 95* 94*  CO2 31 31  GLUCOSE 142* 104*  BUN 47* 43*  CREATININE 1.80* 1.54*  CALCIUM 8.3* 8.2*    Liver Function Tests: No results for input(s): AST, ALT, ALKPHOS, BILITOT, PROT, ALBUMIN in the last 168 hours. No results for input(s): LIPASE, AMYLASE in the last 168 hours. No results for input(s): AMMONIA in the last 168 hours.  CBC:  Recent Labs Lab 02/27/17 0722 02/28/17 0543  WBC 15.3* 11.0  HGB 7.9*  7.3*  HCT 25.1* 22.7*  MCV 81.4 82.2  PLT 254 212    Cardiac Enzymes:  Recent Labs Lab 02/27/17 0722  TROPONINI <0.03    BNP: Invalid input(s): POCBNP  CBG:  Recent Labs Lab 02/27/17 1222 02/27/17 1706 02/27/17 2203 02/28/17 0736 02/28/17 1205  GLUCAP 135* 98 110* 140* 107*    Microbiology: Results for orders placed or performed during the hospital encounter of 02/27/17  Urine culture     Status: None (Preliminary result)   Collection Time: 02/27/17  7:22 AM  Result Value Ref Range Status   Specimen Description URINE, RANDOM  Final   Special Requests NONE  Final   Culture   Final    CULTURE REINCUBATED FOR BETTER GROWTH Performed at [facility], [place] [address], [place], [place] [address]    Report Status PENDING  Incomplete  Blood Culture (routine x 2)     Status: None (Preliminary result)   Collection Time: 02/27/17  8:58 AM  Result Value Ref Range Status   Specimen Description BLOOD LEFT EXTERNAL JUGULAR  Final   Special Requests   Final    BOTTLES DRAWN AEROBIC AND ANAEROBIC Blood Culture adequate volume   Culture  Setup Time   Final    Organism ID to follow GRAM POSITIVE COCCI ANAEROBIC BOTTLE ONLY CRITICAL RESULT CALLED TO, READ BACK BY AND VERIFIED WITH: SCOTT CHRISTY ON 02/28/17 AT  1031 QSD    Culture GRAM POSITIVE COCCI  Final   Report Status PENDING  Incomplete  Blood Culture (routine x 2)     Status: None (Preliminary result)   Collection Time: 02/27/17  8:58 AM  Result Value Ref Range Status   Specimen Description BLOOD BLOOD LEFT FOREARM  Final   Special Requests   Final    BOTTLES DRAWN AEROBIC AND ANAEROBIC Blood Culture adequate volume   Culture NO GROWTH < 24 HOURS  Final   Report Status PENDING  Incomplete  Blood Culture ID Panel (Reflexed)     Status: Abnormal   Collection Time: 02/27/17  8:58 AM  Result Value Ref Range Status   Enterococcus species NOT DETECTED NOT DETECTED Final   Listeria monocytogenes NOT DETECTED NOT  DETECTED Final   Staphylococcus species DETECTED (A) NOT DETECTED Final    Comment: Methicillin (oxacillin) resistant coagulase negative staphylococcus. Possible blood culture contaminant (unless isolated from more than one blood culture draw or clinical case suggests pathogenicity). No antibiotic treatment is indicated for blood  culture contaminants. CRITICAL RESULT CALLED TO, READ BACK BY AND VERIFIED WITH: SCOTT CHRISTY ON 02/28/17 AT 1031 QSD    Staphylococcus aureus NOT DETECTED NOT DETECTED Final   Methicillin resistance DETECTED (A) NOT DETECTED Final    Comment: CRITICAL RESULT CALLED TO, READ BACK BY AND VERIFIED WITH: SCOTT CHRISTY ON 02/28/17 AT 1031 QSD    Streptococcus species NOT DETECTED NOT DETECTED Final   Streptococcus agalactiae NOT DETECTED NOT DETECTED Final   Streptococcus pneumoniae NOT DETECTED NOT DETECTED Final   Streptococcus pyogenes NOT DETECTED NOT DETECTED Final   Acinetobacter baumannii NOT DETECTED NOT DETECTED Final   Enterobacteriaceae species NOT DETECTED NOT DETECTED Final   Enterobacter cloacae complex NOT DETECTED NOT DETECTED Final   Escherichia coli NOT DETECTED NOT DETECTED Final   Klebsiella oxytoca NOT DETECTED NOT DETECTED Final   Klebsiella pneumoniae NOT DETECTED NOT DETECTED Final   Proteus species NOT DETECTED NOT DETECTED Final   Serratia marcescens NOT DETECTED NOT DETECTED Final   Haemophilus influenzae NOT DETECTED NOT DETECTED Final   Neisseria meningitidis NOT DETECTED NOT DETECTED Final   Pseudomonas aeruginosa NOT DETECTED NOT DETECTED Final   Candida albicans NOT DETECTED NOT DETECTED Final   Candida glabrata NOT DETECTED NOT DETECTED Final   Candida krusei NOT DETECTED NOT DETECTED Final   Candida parapsilosis NOT DETECTED NOT DETECTED Final   Candida tropicalis NOT DETECTED NOT DETECTED Final  MRSA PCR Screening     Status: None   Collection Time: 02/27/17 12:13 PM  Result Value Ref Range Status   MRSA by PCR NEGATIVE  NEGATIVE Final    Comment:        The GeneXpert MRSA Assay (FDA approved for NASAL specimens only), is one component of a comprehensive MRSA colonization surveillance program. It is not intended to diagnose MRSA infection nor to guide or monitor treatment for MRSA infections.     Coagulation Studies: No results for input(s): LABPROT, INR in the last 72 hours.  Urinalysis:  Recent Labs  02/27/17 0722  COLORURINE YELLOW*  LABSPEC 1.010  PHURINE 5.0  GLUCOSEU NEGATIVE  HGBUR NEGATIVE  BILIRUBINUR NEGATIVE  KETONESUR NEGATIVE  PROTEINUR NEGATIVE  NITRITE POSITIVE*  LEUKOCYTESUR LARGE*      Imaging: Dg Chest 2 View  Result Date: 02/27/2017 CLINICAL DATA:  Shortness of breath, weakness, pedal edema for a week EXAM: CHEST  2 VIEW COMPARISON:  Chest x-ray of 01/31/2017 FINDINGS: Moderate cardiomegaly is again  noted and there is now more opacity at the left lung base, consistent with left pleural effusion and left basilar atelectasis. Pneumonia would be difficult to exclude. There is mild pulmonary vascular congestion present and permanent pacemaker remains. These changes may all be due to congestive heart failure, but superimposed pneumonia at the left lung base cannot be excluded. No bony abnormality is seen. IMPRESSION: 1. Increased opacity at the left lung base most consistent with left pleural effusion, left basilar atelectasis, and possibly left basilar pneumonia. 2. Cardiomegaly and mild pulmonary vascular congestion. Electronically Signed   By: Ivar Drape M.D.   On: 02/27/2017 08:18     Medications:   . meropenem (MERREM) IV Stopped (02/28/17 0636)   . ALPRAZolam  0.5 mg Oral QHS  . atorvastatin  20 mg Oral QPM  . azithromycin  500 mg Oral Daily  . feeding supplement (ENSURE ENLIVE)  237 mL Oral TID WC  . fenofibrate  54 mg Oral Daily  . ferrous sulfate  325 mg Oral BID WC  . furosemide  40 mg Intravenous BID  . insulin aspart  0-5 Units Subcutaneous QHS  .  insulin aspart  0-9 Units Subcutaneous TID WC  . metoprolol succinate  12.5 mg Oral Daily  . pantoprazole  40 mg Oral QPM  . sertraline  50 mg Oral Daily  . sodium chloride flush  3 mL Intravenous Q12H   acetaminophen **OR** acetaminophen, cyclobenzaprine, fluticasone furoate-vilanterol, nitroGLYCERIN, ondansetron **OR** ondansetron (ZOFRAN) IV, polyethylene glycol, tiotropium, traMADol  Assessment/ Plan:  [name] is a 83 y.o.  female [name] is a 83 y.o. white female with diabetes mellitus type II, coronary artery disease, hypertension, hyperlipidemia, generalized anxiety disorder, gout, anemia, history of lung cancer, COPD, atrial fibrillation, biventricular ICD, fatty liver disease, GERD, osteoporosis   1. Chronic kidney disease stage IV with hyperkalemia: Baseline creatinine of 2.06, GFR of 22. Creatinine much better than baseline on this admission. No signs of acute renal failure.   2. Hypertension with peripheral edema. History of pericardial effusion, pleural effusion and diastolic congestive heart failure - Continue furosemide 40mg  IV q12 - Appreciate cardiology input.  - metoprolol  3. Anemia of chronic kidney disease with recent acute blood loss: hemoglobin 7.3. Low threshold for transfusion - iron supplements  4. Pneumonia: empiric meropenem and azithromycin. Afebrile. Leukocytosis improved.    LOS: [place], Aurelio Mccamy 7/4/201812:21 PM

## 2017-02-28 NOTE — Progress Notes (Addendum)
[facility] [place] NAME: Regina Burke    MR#:  [id number]  DATE OF BIRTH:  06/01/1933  SUBJECTIVE:  Came in with increasing shortness of breath cough and generalized weakness Patient tachypneic this morning REVIEW OF SYSTEMS:   Review of Systems  Constitutional: Negative for chills, fever and weight loss.  HENT: Negative for ear discharge, ear pain and nosebleeds.   Eyes: Negative for blurred vision, pain and discharge.  Respiratory: Positive for shortness of breath. Negative for sputum production, wheezing and stridor.   Cardiovascular: Positive for chest pain. Negative for palpitations, orthopnea and PND.  Gastrointestinal: Negative for abdominal pain, diarrhea, nausea and vomiting.  Genitourinary: Negative for frequency and urgency.  Musculoskeletal: Negative for back pain and joint pain.  Neurological: Positive for weakness. Negative for sensory change, speech change and focal weakness.  Psychiatric/Behavioral: Negative for depression and hallucinations. The patient is not nervous/anxious.    Tolerating Diet:yes Tolerating PT: pending  DRUG ALLERGIES:   Allergies  Allergen Reactions  . Ciprofloxacin Shortness Of Breath, Itching and Rash  . Doxycycline Shortness Of Breath, Itching and Rash  . Penicillins Shortness Of Breath, Itching, Rash and Other (See Comments)    Has patient had a PCN reaction causing immediate rash, facial/tongue/throat swelling, SOB or lightheadedness with hypotension: Yes Has patient had a PCN reaction causing severe rash involving mucus membranes or skin necrosis: No Has patient had a PCN reaction that required hospitalization No Has patient had a PCN reaction occurring within the last 10 years: No If all of the above answers are "NO", then may proceed with Cephalosporin use.  . Sulfa Antibiotics Shortness Of Breath, Itching and Rash  . Latex Itching  . Morphine And Related Itching  . Albuterol  Sulfate   . Macrobid [Nitrofurantoin Macrocrystal] Itching  . Cefuroxime Rash    Blisters in mouth    VITALS:  Blood pressure (!) 158/60, pulse 70, temperature 98.5 F (36.9 C), temperature source Oral, resp. rate 20, height 5\' 4"  (1.626 m), weight 76.7 kg (169 lb), SpO2 99 %.  PHYSICAL EXAMINATION:   Physical Exam  GENERAL:  83 y.o.-year-old patient lying in the bed with no acute distress.  EYES: Pupils equal, round, reactive to light and accommodation. No scleral icterus. Extraocular muscles intact.  HEENT: Head atraumatic, normocephalic. Oropharynx and nasopharynx clear.  NECK:  Supple, no jugular venous distention. No thyroid enlargement, no tenderness.  LUNGS: decreased breath sounds bilaterally, no wheezing, rales, rhonchi. No use of accessory muscles of respiration increase work of breathing.  CARDIOVASCULAR: S1, S2 normal. No murmurs, rubs, or gallops.  ABDOMEN: Soft, nontender, nondistended. Bowel sounds present. No organomegaly or mass.  EXTREMITIES: No cyanosis, clubbing or edema b/l.    NEUROLOGIC: Cranial nerves II through XII are intact. No focal Motor or sensory deficits b/l.   PSYCHIATRIC:  patient is alert and oriented x 3.  SKIN: No obvious rash, lesion, or ulcer.   LABORATORY PANEL:  CBC  Recent Labs Lab 02/28/17 0543  WBC 11.0  HGB 7.3*  HCT 22.7*  PLT 212    Chemistries   Recent Labs Lab 02/28/17 0543  NA 137  K 3.9  CL 94*  CO2 31  GLUCOSE 104*  BUN 43*  CREATININE 1.54*  CALCIUM 8.2*   Cardiac Enzymes  Recent Labs Lab 02/27/17 0722  TROPONINI <0.03   RADIOLOGY:  Dg Chest 2 View  Result Date: 02/27/2017 CLINICAL DATA:  Shortness of breath, weakness, pedal edema for a  week EXAM: CHEST  2 VIEW COMPARISON:  Chest x-ray of 01/31/2017 FINDINGS: Moderate cardiomegaly is again noted and there is now more opacity at the left lung base, consistent with left pleural effusion and left basilar atelectasis. Pneumonia would be difficult to exclude.  There is mild pulmonary vascular congestion present and permanent pacemaker remains. These changes may all be due to congestive heart failure, but superimposed pneumonia at the left lung base cannot be excluded. No bony abnormality is seen. IMPRESSION: 1. Increased opacity at the left lung base most consistent with left pleural effusion, left basilar atelectasis, and possibly left basilar pneumonia. 2. Cardiomegaly and mild pulmonary vascular congestion. Electronically Signed   By: Ivar Drape M.D.   On: 02/27/2017 08:18   ASSESSMENT AND PLAN:  Regina Burke  is a 83 y.o. female with a known history of D CHF, COPD, DM, HTN recent hip fracture, Afib, not on Eliquis due to GI bleed here with SOB and worsening LE eema. Has chronic orthopnea. Here CXR shows LLL PNA and pulm edema  * LLL Pneumonia with acute on chronic hypoxic respiratory failure -due to multiple allergies she is on meropenem and zthromax - MRSA PCR negative -BC neg so far  * Acute on chronic diastolic chf with increasing Left pleural effusion - IV Lasix, Beta blockers - Input and Output - Counseled to limit fluids and Salt - Monitor Bun/Cr and Potassium -echo recently done shows EF 60-65% -Cardiology consulted---spoke with Dr Rockey Situ   *Acute on chronic anemia. Patient has anemia of chronic disease. -EGD done early part of June 2018 negative other than mild nonerosive gastropathy -Patient recently got one unit of blood transfusion last admission she was discharged with hemoglobin of 8.9 -Hemoglobin today 7.3. Given her symptoms of acute and chronic congestive heart failure we'll go ahead and transfuse 1 unit of blood transfusion today. Transfuse thereafter as needed.  * DM2 Home meds and SSI  * CKD3 Stable Monitor while being diuresed  * HTN Continue home meds  * COPD No wheezing  Case discussed with Care Management/Social Worker. Management plans discussed with the patient, family and they are in  agreement.  CODE STATUS: DNR  DVT Prophylaxis: SCD/TEDS  TOTAL TIME TAKING CARE OF THIS PATIENT: 30 minutes.  >50% time spent on counselling and coordination of care  POSSIBLE D/C IN 1-2 DAYS, DEPENDING ON CLINICAL CONDITION.  Note: This dictation was prepared with Dragon dictation along with smaller phrase technology. Any transcriptional errors that result from this process are unintentional.  Francine Hannan M.D on 02/28/2017 at 9:15 AM  Between 7am to 6pm - Pager - [phone]  After 6pm go to [email or web address] - password [email or web address]  Office  [phone]  CC: Primary care physician; Tracie Harrier, MD

## 2017-02-28 NOTE — Consult Note (Signed)
Cardiology Consultation:   Patient ID: Regina Burke; [id number]; 14-Apr-1933   Admit date: 02/27/2017 Date of Consult: 02/28/2017  Primary Care Provider: Tracie Harrier, MD Primary Cardiologist: Rockey Situ    Patient Profile:   Regina Burke is a 83 y.o. female with a hx of  COPD on chronic oxygen,   coronary artery disease with hospitalization April 2016 stent placed to marginal branch of the circumflex, Persistent atrial fibrillation, not on anticoagulation secondary to GI bleed 03/31/2016, AV node ablation,  Biventricular ICD implantation severe anemia, previous epo shot lung cancer, diabetes mellitus,  episode of acute respiratory distress requiring hospitalization  who is being seen today for the evaluation of acute respiratory distress at the request of Dr. Fritzi Mandes  History of Present Illness:   Regina Burke was recently admitted to [facility] for GI bleed requiring transfusion of one unit packed red blood cells, kidney failure requiring intermittent hemodialysis, acute on chronic combined CHF, demand ischemia, and electrolyte abnormalities. Echocardiogram during that admission showed stable and improved EF of 65-70% when compared to echocardiogram from 01/13/17 and 03/29/16. She was just discharged on 6/5.  She suffered a fall with hip fracture on the left requiring surgical intervention Discharge on 02/05/2017 She reports that she was weak, though stable Acutely developed shortness of breath over the past several days  On evaluation in the emergency room she had shortness of breath, effusion at the left base,  worsening anemia with hemoglobin of 7 She was started on Lasix IV Also placed on broad-spectrum antibiotics for possible pneumonia  This morning on my evaluation she reports worsening shortness of breath Unable to recover after going to the bathroom, still on nasal cannula oxygen Case discussed with medicine service They're planning on a transfusion 1   Past  Medical History:  Diagnosis Date  . Anemia   . Arthritis   . Asthma   . Cardiac resynchronization therapy pacemaker (CRT-P) in place    a. 03/31/16:  Medtronic Percepta Quad CRT-P MRI SureScan (serial Number [id number]) device.  . Chronic diastolic CHF (congestive heart failure) ([place])    a. 10/2015 Echo: EF 55-65%, Gr1 DD, mild MR, mildly dil LA, nl RV fxn, nl PASP;  b. 03/2016 Echo: EF 30-35%, mod MR, mod dil LA; c. 12/2016 Echo: EF 60-65%, no rwma, mild AS, mildly dil LA, PASP 46mmHg.  . CKD (chronic kidney disease), stage IV ([place])   . COPD (chronic obstructive pulmonary disease) ([place])   . Coronary artery disease    a. 11/2014 NSTEMI/PCI: LM nl, LAD 68m, D1 30, LCX mild dzs, OM1 20p, OM2 62m, OM3 90p (2.25x8 Promus Premier DES), RCA nl.   . Cough    CHRONIC AT NIGHT  . Cushing's disease ([place])   . Depression   . Diverticulitis   . Edema    FEET/LEGS  . GERD (gastroesophageal reflux disease)   . Gout   . History of hiatal hernia   . History of pneumonia   . HLD (hyperlipidemia)   . HOH (hard of hearing)   . Hypertensive heart disease   . Lung cancer ([place]) dx'd 2014   S/P radiation 2015  . Migraine   . Mixed Ischemic and Nonischemic Cardiomyopathy ([place])    a. 03/2016 Echo: EF 30-35%;  b. 12/2016 Echo: EF 60-65%.  . Moderate mitral regurgitation   . Multiple allergies   . Myocardial infarction ([place])   . Oxygen deficiency    2LITERS  . Persistent atrial fibrillation ([place])    a.  CHADS2VASc ==> 7 (CHF, HTN, age x 2, DM, vascular disease, and gender)--was on renal dosed eliquis but this was d/c'd in 12/2016 in setting of dark stools/anemia;  c. 03/2016 s/p AVN and MDT BiV ICD placement.  . Presence of permanent cardiac pacemaker   . S/P AV nodal ablation    a. on 03/31/16 for persistant afib with CRT-P placement  . Sleep apnea   . Type II diabetes mellitus ([place])     Past Surgical History:  Procedure Laterality Date  . ABDOMINAL HYSTERECTOMY    . ABLATION     July 2017  .  ADRENALECTOMY Left 1980's   "[place]"  . APPENDECTOMY    . BREAST CYST EXCISION Left   . CATARACT EXTRACTION W/PHACO Right 12/28/2015   Procedure: CATARACT EXTRACTION PHACO AND INTRAOCULAR LENS PLACEMENT (IOC);  Surgeon: Birder Robson, MD;  Location: [facility] ORS;  Service: Ophthalmology;  Laterality: Right;  [place] 48.4   . CATARACT EXTRACTION W/PHACO Left 11/14/2016   Procedure: CATARACT EXTRACTION PHACO AND INTRAOCULAR LENS PLACEMENT (IOC);  Surgeon: Birder Robson, MD;  Location: [facility] ORS;  Service: Ophthalmology;  Laterality: Left;  [place] 59.8 AP% 18.1 CDE 10.78 Fluid pack lot # [id number]  . CHOLECYSTECTOMY    . CORONARY ANGIOPLASTY WITH STENT PLACEMENT  12/23/2014  . ELECTROPHYSIOLOGIC STUDY N/A 03/08/2016   Procedure: CARDIOVERSION;  Surgeon: Wende Bushy, MD;  Location: [facility] OR[facility];  Service: Cardiovascular;  Laterality: N/A;  . ELECTROPHYSIOLOGIC STUDY N/A 03/07/2016   Procedure: Cardioversion;  Surgeon: Wende Bushy, MD;  Location: [facility];  Service: Cardiovascular;  Laterality: N/A;  . ELECTROPHYSIOLOGIC STUDY N/A 03/31/2016   Procedure: AV Node Ablation;  Surgeon: Will Meredith Leeds, MD;  Location: [facility];  Service: Cardiovascular;  Laterality: N/A;  . EP IMPLANTABLE DEVICE N/A 03/31/2016   Procedure: BiV Pacemaker Insertion CRT-P;  Surgeon: Will Meredith Leeds, MD;  Location: [facility];  Service: Cardiovascular;  Laterality: N/A;  . ESOPHAGOGASTRODUODENOSCOPY (EGD) WITH PROPOFOL N/A 01/26/2017   Procedure: ESOPHAGOGASTRODUODENOSCOPY (EGD) WITH PROPOFOL;  Surgeon: [name], MD;  Location: [facility];  Service: Endoscopy;  Laterality: N/A;  . FRACTURE SURGERY    . HIP PINNING,CANNULATED Left 02/01/2017   Procedure: CANNULATED HIP PINNING;  Surgeon: Lovell Sheehan, MD;  Location: [facility];  Service: Orthopedics;  Laterality: Left;  . INSERT / REPLACE / REMOVE PACEMAKER  02/2016  . LEFT HEART CATHETERIZATION WITH CORONARY ANGIOGRAM N/A 12/23/2014   Procedure: LEFT HEART  CATHETERIZATION WITH CORONARY ANGIOGRAM;  Surgeon: Burnell Blanks, MD;  Location: [facility] CATH LAB;  Service: Cardiovascular;  Laterality: N/A;  . PERCUTANEOUS CORONARY STENT INTERVENTION (PCI-S)  12/23/2014   Procedure: PERCUTANEOUS CORONARY STENT INTERVENTION (PCI-S);  Surgeon: Burnell Blanks, MD;  Location: [facility] CATH LAB;  Service: Cardiovascular;;  Promus 2.25x8  . TONSILLECTOMY    . TRANSTHORACIC ECHOCARDIOGRAM  11/26/2015   Technically difficult study. EF 55-60%. Normal wall motion. GR 1 DD.  . TUBAL LIGATION    . WRIST FRACTURE SURGERY Bilateral ~ 2000     Inpatient Medications: Scheduled Meds: . ALPRAZolam  0.5 mg Oral QHS  . atorvastatin  20 mg Oral QPM  . azithromycin  500 mg Oral Daily  . feeding supplement (ENSURE ENLIVE)  237 mL Oral TID WC  . fenofibrate  54 mg Oral Daily  . ferrous sulfate  325 mg Oral BID WC  . furosemide  40 mg Intravenous BID  . insulin aspart  0-5 Units Subcutaneous QHS  . insulin aspart  0-9 Units Subcutaneous  TID WC  . metoprolol succinate  12.5 mg Oral Daily  . pantoprazole  40 mg Oral QPM  . sertraline  50 mg Oral Daily  . sodium chloride flush  3 mL Intravenous Q12H   Continuous Infusions: . sodium chloride    . meropenem (MERREM) IV Stopped (02/28/17 0636)   PRN Meds: acetaminophen **OR** acetaminophen, cyclobenzaprine, fluticasone furoate-vilanterol, levalbuterol, nitroGLYCERIN, ondansetron **OR** ondansetron (ZOFRAN) IV, polyethylene glycol, tiotropium, traMADol  Allergies:    Allergies  Allergen Reactions  . Ciprofloxacin Shortness Of Breath, Itching and Rash  . Doxycycline Shortness Of Breath, Itching and Rash  . Penicillins Shortness Of Breath, Itching, Rash and Other (See Comments)    Has patient had a PCN reaction causing immediate rash, facial/tongue/throat swelling, SOB or lightheadedness with hypotension: Yes Has patient had a PCN reaction causing severe rash involving mucus membranes or skin necrosis: No Has patient  had a PCN reaction that required hospitalization No Has patient had a PCN reaction occurring within the last 10 years: No If all of the above answers are "NO", then may proceed with Cephalosporin use.  . Sulfa Antibiotics Shortness Of Breath, Itching and Rash  . Latex Itching  . Morphine And Related Itching  . Albuterol Sulfate   . Macrobid [Nitrofurantoin Macrocrystal] Itching  . Cefuroxime Rash    Blisters in mouth    Social History:   Social History   Social History  . Marital status: Widowed    Spouse name: N/A  . Number of children: N/A  . Years of education: N/A   Occupational History  . retired    Social History Main Topics  . Smoking status: Former Smoker    Packs/day: 1.00    Years: 45.00    Types: Cigarettes    Quit date: 04/25/1994  . Smokeless tobacco: Never Used  . Alcohol use No     Comment: 12/23/2014 "might have a couple mixed drinks/year"  . Drug use: No  . Sexual activity: No   Other Topics Concern  . Not on file   Social History Narrative   Lives locally with son.  Does not routinely exercise.    Family History:   The patient's family history includes COPD in her brother; Diabetes in her mother; Heart disease in her father and mother; Hypertension in her father and mother; Osteoarthritis in her mother.  ROS:  Please see the history of present illness.  Review of Systems  Constitution: Positive for weakness. Negative for diaphoresis, fever, malaise/fatigue and night sweats.  HENT: Negative.   Eyes: Negative.   Cardiovascular: Positive for dyspnea on exertion and orthopnea. Negative for chest pain, claudication, cyanosis, irregular heartbeat, leg swelling, near-syncope, palpitations and paroxysmal nocturnal dyspnea.  Respiratory: Negative for cough, shortness of breath, sleep disturbances due to breathing and wheezing.   Endocrine: Negative.   Hematologic/Lymphatic: Negative.   Skin: Negative.   Musculoskeletal: Negative for falls, joint pain,  joint swelling and myalgias.  Gastrointestinal: Negative.   Neurological: Negative for difficulty with concentration, excessive daytime sleepiness, dizziness, focal weakness, light-headedness and numbness.  Psychiatric/Behavioral: Negative.     All other ROS reviewed and negative.     Physical Exam/Data:   Vitals:   02/28/17 0525 02/28/17 0748 02/28/17 1209 02/28/17 1406  BP: (!) 146/40 (!) 158/60 (!) 165/63   Pulse: 69 70 71   Resp: 20 20 20    Temp: 98.5 F (36.9 C)  98.3 F (36.8 C)   TempSrc: Oral  Oral   SpO2: 99%  99% 99%  Weight:      Height:        Intake/Output Summary (Last 24 hours) at 02/28/17 1605 Last data filed at 02/28/17 1337  Gross per 24 hour  Intake              220 ml  Output              700 ml  Net             -480 ml   Filed Weights   02/27/17 0639 02/27/17 1301 02/28/17 0500  Weight: 179 lb (81.2 kg) 172 lb 11.2 oz (78.3 kg) 169 lb (76.7 kg)   Body mass index is 29.01 kg/m.  General:  Well nourished, well developed,Moderate distress, dyspnea HEENT: normal Lymph: no adenopathy Neck: no JVD Endocrine:  No thryomegaly Vascular: No carotid bruits; FA pulses 2+ bilaterally without bruits  Cardiac:  normal S1, S2; RRR; 2/6 SEM LSB  Lungs:   + expiratory wheezing, scattered rales  Abd: soft, nontender, no hepatomegaly  Ext: no edema Musculoskeletal:  No deformities, BUE and BLE strength normal and equal Skin: warm and dry  Neuro:  CNs 2-12 intact, no focal abnormalities noted Psych:  Normal affect   EKG:  The EKG was personally reviewed and demonstrates:  Paced rhythm rate 79 bpm, underlying atrial fibrillation Telemetry:  Telemetry was personally reviewed and demonstrates:  Atrial fibrillation  Relevant CV Studies: Echocardiogram 01/25/2017 showing ejection fraction 65-70%, left atrium moderately dilated  Laboratory Data:  Chemistry Recent Labs Lab 02/27/17 0722 02/28/17 0543  NA 138 137  K 4.0 3.9  CL 95* 94*  CO2 31 31  GLUCOSE  142* 104*  BUN 47* 43*  CREATININE 1.80* 1.54*  CALCIUM 8.3* 8.2*  GFRNONAA 25* 30*  GFRAA 29* 35*  ANIONGAP 12 12    No results for input(s): PROT, ALBUMIN, AST, ALT, ALKPHOS, BILITOT in the last 168 hours. Hematology Recent Labs Lab 02/27/17 0722 02/28/17 0543  WBC 15.3* 11.0  RBC 3.08* 2.76*  HGB 7.9* 7.3*  HCT 25.1* 22.7*  MCV 81.4 82.2  MCH 25.7* 26.4  MCHC 31.6* 32.1  RDW 19.3* 19.0*  PLT 254 212   Cardiac Enzymes Recent Labs Lab 02/27/17 0722  TROPONINI <0.03   No results for input(s): TROPIPOC in the last 168 hours.  BNPNo results for input(s): BNP, PROBNP in the last 168 hours.  DDimer No results for input(s): DDIMER in the last 168 hours.  Radiology/Studies:  Dg Chest 2 View  Result Date: 02/27/2017 CLINICAL DATA:  Shortness of breath, weakness, pedal edema for a week EXAM: CHEST  2 VIEW COMPARISON:  Chest x-ray of 01/31/2017 FINDINGS: Moderate cardiomegaly is again noted and there is now more opacity at the left lung base, consistent with left pleural effusion and left basilar atelectasis. Pneumonia would be difficult to exclude. There is mild pulmonary vascular congestion present and permanent pacemaker remains. These changes may all be due to congestive heart failure, but superimposed pneumonia at the left lung base cannot be excluded. No bony abnormality is seen. IMPRESSION: 1. Increased opacity at the left lung base most consistent with left pleural effusion, left basilar atelectasis, and possibly left basilar pneumonia. 2. Cardiomegaly and mild pulmonary vascular congestion. Electronically Signed   By: Ivar Drape M.D.   On: 02/27/2017 08:18    Assessment and Plan:   1. Acute respiratory distress Multifactorial including found anemia, underlying COPD, acute on chronic diastolic CHF -Recommend a transfusion with 1-2 units, goal hemoglobin of 10 -  Ordered Xopenex treatments every  (unable to tolerate albuterol)  -We'll give extra Lasix 60 mg IV 1 now.  Continue Lasix 40 IV twice a day Notes indicate she was taking Lasix 40 mg daily at home. She may need higher dose, or consider changing to torsemide 40 daily  2. Anemia Chronic issue, recent GI bleed Blood transfusion several weeks ago, requires additional unit now Needs follow-up with hematology/nephrology Likely needs Procrit  3) chronic renal failure Creatinine likely at baseline 1.54  4) COPD Less likely pneumonia Small pleural effusion on the left likely secondary to anemia and CHF symptoms Would hold off on thoracentesis given minimal effusion and high risk to complication  5) diabetes type 2  On home medications, sliding scale  Case discussed with hospitalist service, nursing, respiratory therapy  Total encounter time more than 110 minutes  Greater than 50% was spent in counseling and coordination of care with the patient   Signed, Ida Rogue, MD  02/28/2017 4:05 PM

## 2017-02-28 NOTE — Progress Notes (Signed)
PHARMACY - PHYSICIAN COMMUNICATION CRITICAL VALUE ALERT - BLOOD CULTURE IDENTIFICATION (BCID)  Results for orders placed or performed during the hospital encounter of 02/27/17  Blood Culture ID Panel (Reflexed) (Collected: 02/27/2017  8:58 AM)  Result Value Ref Range   Enterococcus species NOT DETECTED NOT DETECTED   Listeria monocytogenes NOT DETECTED NOT DETECTED   Staphylococcus species DETECTED (A) NOT DETECTED   Staphylococcus aureus NOT DETECTED NOT DETECTED   Methicillin resistance DETECTED (A) NOT DETECTED   Streptococcus species NOT DETECTED NOT DETECTED   Streptococcus agalactiae NOT DETECTED NOT DETECTED   Streptococcus pneumoniae NOT DETECTED NOT DETECTED   Streptococcus pyogenes NOT DETECTED NOT DETECTED   Acinetobacter baumannii NOT DETECTED NOT DETECTED   Enterobacteriaceae species NOT DETECTED NOT DETECTED   Enterobacter cloacae complex NOT DETECTED NOT DETECTED   Escherichia coli NOT DETECTED NOT DETECTED   Klebsiella oxytoca NOT DETECTED NOT DETECTED   Klebsiella pneumoniae NOT DETECTED NOT DETECTED   Proteus species NOT DETECTED NOT DETECTED   Serratia marcescens NOT DETECTED NOT DETECTED   Haemophilus influenzae NOT DETECTED NOT DETECTED   Neisseria meningitidis NOT DETECTED NOT DETECTED   Pseudomonas aeruginosa NOT DETECTED NOT DETECTED   Candida albicans NOT DETECTED NOT DETECTED   Candida glabrata NOT DETECTED NOT DETECTED   Candida krusei NOT DETECTED NOT DETECTED   Candida parapsilosis NOT DETECTED NOT DETECTED   Candida tropicalis NOT DETECTED NOT DETECTED    Name of physician (or Provider) Contacted: Fritzi Mandes  Changes to prescribed antibiotics required: no change  [place]ap[place]on Form 02/28/2017  11:25 AM

## 2017-02-28 NOTE — Clinical Social Work Note (Signed)
Clinical Social Work Assessment  Patient Details  Name: Regina Burke MRN: [id number] Date of Birth: [birth year]  Date of referral:  02/28/17               Reason for consult:  Facility Placement                Permission sought to share information with:    Permission granted to share information::     Name::        Agency::     Relationship::     Contact Information:     Housing/Transportation Living arrangements for the past 2 months:  [place], [place] of Information:  Patient, Adult Children Patient Interpreter Needed:  None Criminal Activity/Legal Involvement Pertinent to Current Situation/Hospitalization:  No - Comment as needed Significant Relationships:  Adult Children Lives with:  Adult Children Do you feel safe going back to the place where you live?  Yes Need for family participation in patient care:  Yes (Comment)  Care giving concerns:  Patient lives in [place] with her son Regina Burke [phone].    Social Worker assessment / plan:  Holiday representative (CSW) reviewed chart and noted that PT is recommending SNF. CSW met with patient alone at bedside to discuss D/C plan. Patient was alert and oriented X4 and was sitting up in the bed. CSW introduced self and explained role of CSW department. Patient reported that she lives with her son Regina Burke in [place] and has chronic oxygen at home. Patient reported that she was recently at [facility] for 3-4 weeks and doesn't want to go back to SNF. CSW explained that PT is recommending SNF and the benefits of SNF. Patient adamantly refused SNF and reported that she would consider home health. Patient also requested a bedside commode. RN case manager aware of above. Patient gave CSW permission to call her son Regina Burke.   CSW contacted patient's son Regina Burke and made him aware of above. Per Regina Burke patient wasted 17 days at [facility] because she refused to work with PT. Regina Burke reported that patient can return home because  he can't force her to go to SNF. Regina Burke stated that he would like for patient to have home health PT, RN and aide. CSW provided emotional support and explained how to apply for long term care medicaid for long term placement for patient. Regina Burke thanked CSW and stated that patient would never agree to long term care placement. Plan is for patient to D/C home with home health. CSW will continue to follow and assist as needed.   Employment status:  Retired, Disabled (Comment on whether or not currently receiving Disability) Insurance information:  Programmer, applications PT Recommendations:  [place] / Referral to community resources:  Other (Comment Required) (Patient is refusing SNF. )  Patient/Family's Response to care:  Patient is refusing SNF.   Patient/Family's Understanding of and Emotional Response to Diagnosis, Current Treatment, and Prognosis:  Patient and her son were pleasant and thanked CSW for assistance.   Emotional Assessment Appearance:  Appears stated age Attitude/Demeanor/Rapport:    Affect (typically observed):  Pleasant Orientation:  Oriented to Self, Oriented to Place, Oriented to  Time, Oriented to Situation Alcohol / Substance use:  Not Applicable Psych involvement (Current and /or in the community):  No (Comment)  Discharge Needs  Concerns to be addressed:  Discharge Planning Concerns Readmission within the last 30 days:  Yes Current discharge risk:  Chronically ill, Dependent with Mobility Barriers to  Discharge:  Continued Medical Work up   [employer], Regina Burke, Regina Burke 02/28/2017, 12:46 PM

## 2017-03-01 DIAGNOSIS — R0602 Shortness of breath: Secondary | ICD-10-CM

## 2017-03-01 DIAGNOSIS — Z7189 Other specified counseling: Secondary | ICD-10-CM

## 2017-03-01 DIAGNOSIS — I509 Heart failure, unspecified: Secondary | ICD-10-CM

## 2017-03-01 DIAGNOSIS — D649 Anemia, unspecified: Secondary | ICD-10-CM

## 2017-03-01 LAB — BPAM RBC
BLOOD PRODUCT EXPIRATION DATE: 201807112359
ISSUE DATE / TIME: 201807041814
UNIT TYPE AND RH: 6200

## 2017-03-01 LAB — TYPE AND SCREEN
ABO/RH(D): A POS
ANTIBODY SCREEN: NEGATIVE
UNIT DIVISION: 0

## 2017-03-01 LAB — GLUCOSE, CAPILLARY
GLUCOSE-CAPILLARY: 144 mg/dL — AB (ref 65–99)
Glucose-Capillary: 139 mg/dL — ABNORMAL HIGH (ref 65–99)
Glucose-Capillary: 122 mg/dL — ABNORMAL HIGH (ref 65–99)
Glucose-Capillary: 143 mg/dL — ABNORMAL HIGH (ref 65–99)

## 2017-03-01 LAB — HEMOGLOBIN: HEMOGLOBIN: 8.4 g/dL — AB (ref 12.0–16.0)

## 2017-03-01 MED ORDER — MEROPENEM-SODIUM CHLORIDE 500 MG/50ML IV SOLR
500.0000 mg | Freq: Two times a day (BID) | INTRAVENOUS | Status: DC
  Administered 2017-03-01 – 2017-03-02 (×2): 500 mg via INTRAVENOUS
  Filled 2017-03-01 (×2): qty 50

## 2017-03-01 NOTE — Progress Notes (Signed)
[facility]  ROUNDING NOTE   Subjective:   Furosemide 60mg  IV x one yesterday. Now on furosemide 40mg  IV bid.   UOP 1400  PRBC 1 unit transfusion  Objective:  Vital signs in last 24 hours:  Temp:  [97.8 F (36.6 C)-98.4 F (36.9 C)] 97.8 F (36.6 C) (07/05 0414) Pulse Rate:  [69-75] 71 (07/05 0817) Resp:  [16-20] 18 (07/05 0414) BP: (124-149)/(44-62) 146/47 (07/05 0817) SpO2:  [92 %-100 %] 92 % (07/05 0817) Weight:  [80.9 kg (178 lb 4 oz)] 80.9 kg (178 lb 4 oz) (07/05 0414)  Weight change: 2.517 kg (5 lb 8.8 oz) Filed Weights   02/27/17 1301 02/28/17 0500 03/01/17 0414  Weight: 78.3 kg (172 lb 11.2 oz) 76.7 kg (169 lb) 80.9 kg (178 lb 4 oz)    Intake/Output: I/O last 3 completed shifts: In: 30 [P.O.:120; Blood:320; IV Piggyback:100] Out: 1400 [Urine:1400]   Intake/Output this shift:  Total I/O In: 480 [P.O.:480] Out: 350 [Urine:350]  Physical Exam: General: NAD, laying in bed  Head: Normocephalic, atraumatic. Moist oral mucosal membranes  Eyes: Anicteric, PERRL  Neck: Supple, trachea midline  Lungs:  clear  Heart: Regular rate and rhythm  Abdomen:  Soft, nontender,   Extremities: no peripheral edema.  Neurologic: Nonfocal, moving all four extremities  Skin: No lesions       Basic Metabolic Panel:  Recent Labs Lab 02/27/17 0722 02/28/17 0543  NA 138 137  K 4.0 3.9  CL 95* 94*  CO2 31 31  GLUCOSE 142* 104*  BUN 47* 43*  CREATININE 1.80* 1.54*  CALCIUM 8.3* 8.2*    Liver Function Tests: No results for input(s): AST, ALT, ALKPHOS, BILITOT, PROT, ALBUMIN in the last 168 hours. No results for input(s): LIPASE, AMYLASE in the last 168 hours. No results for input(s): AMMONIA in the last 168 hours.  CBC:  Recent Labs Lab 02/27/17 0722 02/28/17 0543 03/01/17 0626  WBC 15.3* 11.0  --   HGB 7.9* 7.3* 8.4*  HCT 25.1* 22.7*  --   MCV 81.4 82.2  --   PLT 254 212  --     Cardiac Enzymes:  Recent Labs Lab 02/27/17 0722   TROPONINI <0.03    BNP: Invalid input(s): POCBNP  CBG:  Recent Labs Lab 02/28/17 1205 02/28/17 1712 02/28/17 2012 03/01/17 0749 03/01/17 1150  GLUCAP 107* 192* 20 139* 122*    Microbiology: Results for orders placed or performed during the hospital encounter of 02/27/17  Urine culture     Status: Abnormal (Preliminary result)   Collection Time: 02/27/17  7:22 AM  Result Value Ref Range Status   Specimen Description URINE, RANDOM  Final   Special Requests NONE  Final   Culture (A)  Final    >=100,000 COLONIES/mL ESCHERICHIA COLI SUSCEPTIBILITIES TO FOLLOW Performed at [facility], [place] [address] [place], [place] [address]    Report Status PENDING  Incomplete  Blood Culture (routine x 2)     Status: Abnormal (Preliminary result)   Collection Time: 02/27/17  8:58 AM  Result Value Ref Range Status   Specimen Description BLOOD LEFT EXTERNAL JUGULAR  Final   Special Requests   Final    BOTTLES DRAWN AEROBIC AND ANAEROBIC Blood Culture adequate volume   Culture  Setup Time   Final    GRAM POSITIVE COCCI ANAEROBIC BOTTLE ONLY CRITICAL RESULT CALLED TO, READ BACK BY AND VERIFIED WITH: SCOTT CHRISTY ON 02/28/17 AT 1031 QSD    Culture STAPHYLOCOCCUS SPECIES (COAGULASE NEGATIVE) (  A)  Final   Report Status PENDING  Incomplete  Blood Culture (routine x 2)     Status: None (Preliminary result)   Collection Time: 02/27/17  8:58 AM  Result Value Ref Range Status   Specimen Description BLOOD BLOOD LEFT FOREARM  Final   Special Requests   Final    BOTTLES DRAWN AEROBIC AND ANAEROBIC Blood Culture adequate volume   Culture NO GROWTH 2 DAYS  Final   Report Status PENDING  Incomplete  Blood Culture ID Panel (Reflexed)     Status: Abnormal   Collection Time: 02/27/17  8:58 AM  Result Value Ref Range Status   Enterococcus species NOT DETECTED NOT DETECTED Final   Listeria monocytogenes NOT DETECTED NOT DETECTED Final   Staphylococcus species DETECTED (A) NOT DETECTED Final     Comment: Methicillin (oxacillin) resistant coagulase negative staphylococcus. Possible blood culture contaminant (unless isolated from more than one blood culture draw or clinical case suggests pathogenicity). No antibiotic treatment is indicated for blood  culture contaminants. CRITICAL RESULT CALLED TO, READ BACK BY AND VERIFIED WITH: SCOTT CHRISTY ON 02/28/17 AT 1031 QSD    Staphylococcus aureus NOT DETECTED NOT DETECTED Final   Methicillin resistance DETECTED (A) NOT DETECTED Final    Comment: CRITICAL RESULT CALLED TO, READ BACK BY AND VERIFIED WITH: SCOTT CHRISTY ON 02/28/17 AT 1031 QSD    Streptococcus species NOT DETECTED NOT DETECTED Final   Streptococcus agalactiae NOT DETECTED NOT DETECTED Final   Streptococcus pneumoniae NOT DETECTED NOT DETECTED Final   Streptococcus pyogenes NOT DETECTED NOT DETECTED Final   Acinetobacter baumannii NOT DETECTED NOT DETECTED Final   Enterobacteriaceae species NOT DETECTED NOT DETECTED Final   Enterobacter cloacae complex NOT DETECTED NOT DETECTED Final   Escherichia coli NOT DETECTED NOT DETECTED Final   Klebsiella oxytoca NOT DETECTED NOT DETECTED Final   Klebsiella pneumoniae NOT DETECTED NOT DETECTED Final   Proteus species NOT DETECTED NOT DETECTED Final   Serratia marcescens NOT DETECTED NOT DETECTED Final   Haemophilus influenzae NOT DETECTED NOT DETECTED Final   Neisseria meningitidis NOT DETECTED NOT DETECTED Final   Pseudomonas aeruginosa NOT DETECTED NOT DETECTED Final   Candida albicans NOT DETECTED NOT DETECTED Final   Candida glabrata NOT DETECTED NOT DETECTED Final   Candida krusei NOT DETECTED NOT DETECTED Final   Candida parapsilosis NOT DETECTED NOT DETECTED Final   Candida tropicalis NOT DETECTED NOT DETECTED Final  MRSA PCR Screening     Status: None   Collection Time: 02/27/17 12:13 PM  Result Value Ref Range Status   MRSA by PCR NEGATIVE NEGATIVE Final    Comment:        The GeneXpert MRSA Assay (FDA approved for  NASAL specimens only), is one component of a comprehensive MRSA colonization surveillance program. It is not intended to diagnose MRSA infection nor to guide or monitor treatment for MRSA infections.     Coagulation Studies: No results for input(s): LABPROT, INR in the last 72 hours.  Urinalysis:  Recent Labs  02/27/17 0722  COLORURINE YELLOW*  LABSPEC 1.010  PHURINE 5.0  GLUCOSEU NEGATIVE  HGBUR NEGATIVE  BILIRUBINUR NEGATIVE  KETONESUR NEGATIVE  PROTEINUR NEGATIVE  NITRITE POSITIVE*  LEUKOCYTESUR LARGE*      Imaging: No results found.   Medications:   . meropenem     . ALPRAZolam  0.5 mg Oral QHS  . atorvastatin  20 mg Oral QPM  . azithromycin  500 mg Oral Daily  . feeding supplement (ENSURE ENLIVE)  237  mL Oral TID WC  . fenofibrate  54 mg Oral Daily  . ferrous sulfate  325 mg Oral BID WC  . furosemide  40 mg Intravenous BID  . insulin aspart  0-5 Units Subcutaneous QHS  . insulin aspart  0-9 Units Subcutaneous TID WC  . metoprolol succinate  12.5 mg Oral Daily  . pantoprazole  40 mg Oral QPM  . sertraline  50 mg Oral Daily  . sodium chloride flush  3 mL Intravenous Q12H   acetaminophen **OR** acetaminophen, cyclobenzaprine, fluticasone furoate-vilanterol, levalbuterol, nitroGLYCERIN, ondansetron **OR** ondansetron (ZOFRAN) IV, polyethylene glycol, tiotropium, traMADol  Assessment/ Plan:  Ms. TIMIRA BIEDA is a 83 y.o.  female Ms. KEALANI LECKEY is a 83 y.o. white female with diabetes mellitus type II, coronary artery disease, hypertension, hyperlipidemia, generalized anxiety disorder, gout, anemia, history of lung cancer, COPD, atrial fibrillation, biventricular ICD, fatty liver disease, GERD, osteoporosis   1. Chronic kidney disease stage IV with hyperkalemia: Baseline creatinine of 2.06, GFR of 22. Creatinine much better than baseline on this admission. No signs of acute renal failure.   2. Hypertension with peripheral edema. History of  pericardial effusion, pleural effusion and diastolic congestive heart failure - Continue furosemide 40mg  IV q12 - Appreciate cardiology input.  - metoprolol  3. Anemia of chronic kidney disease with recent acute blood loss: status post transfusion PRBC 7/4 - iron supplements  4. Pneumonia: empiric meropenem and azithromycin. Afebrile. Leukocytosis improved.    LOS: 2 Juliona Vales 7/5/20182:49 PM

## 2017-03-01 NOTE — Progress Notes (Signed)
[facility] [place] NAME: Regina Burke    MR#:  [id number]  DATE OF BIRTH:  1933-03-30  SUBJECTIVE:  Came in with increasing shortness of breath cough and generalized weakness Looks much improved today REVIEW OF SYSTEMS:   Review of Systems  Constitutional: Negative for chills, fever and weight loss.  HENT: Negative for ear discharge, ear pain and nosebleeds.   Eyes: Negative for blurred vision, pain and discharge.  Respiratory: Positive for shortness of breath. Negative for sputum production, wheezing and stridor.   Cardiovascular: Negative for palpitations, orthopnea and PND.  Gastrointestinal: Negative for abdominal pain, diarrhea, nausea and vomiting.  Genitourinary: Negative for frequency and urgency.  Musculoskeletal: Negative for back pain and joint pain.  Neurological: Positive for weakness. Negative for sensory change, speech change and focal weakness.  Psychiatric/Behavioral: Negative for depression and hallucinations. The patient is not nervous/anxious.    Tolerating Diet:yes Tolerating PT: pending  DRUG ALLERGIES:   Allergies  Allergen Reactions  . Ciprofloxacin Shortness Of Breath, Itching and Rash  . Doxycycline Shortness Of Breath, Itching and Rash  . Penicillins Shortness Of Breath, Itching, Rash and Other (See Comments)    Has patient had a PCN reaction causing immediate rash, facial/tongue/throat swelling, SOB or lightheadedness with hypotension: Yes Has patient had a PCN reaction causing severe rash involving mucus membranes or skin necrosis: No Has patient had a PCN reaction that required hospitalization No Has patient had a PCN reaction occurring within the last 10 years: No If all of the above answers are "NO", then may proceed with Cephalosporin use.  . Sulfa Antibiotics Shortness Of Breath, Itching and Rash  . Latex Itching  . Morphine And Related Itching  . Albuterol Sulfate   . Macrobid  [Nitrofurantoin Macrocrystal] Itching  . Cefuroxime Rash    Blisters in mouth    VITALS:  Blood pressure (!) 149/56, pulse 70, temperature 97.8 F (36.6 C), temperature source Oral, resp. rate 18, height 5\' 4"  (1.626 m), weight 80.9 kg (178 lb 4 oz), SpO2 98 %.  PHYSICAL EXAMINATION:   Physical Exam  GENERAL:  83 y.o.-year-old patient lying in the bed with no acute distress.  EYES: Pupils equal, round, reactive to light and accommodation. No scleral icterus. Extraocular muscles intact.  HEENT: Head atraumatic, normocephalic. Oropharynx and nasopharynx clear.  NECK:  Supple, no jugular venous distention. No thyroid enlargement, no tenderness.  LUNGS: decreased breath sounds bilaterally, no wheezing, rales, rhonchi. No use of accessory muscles of respiration increase work of breathing.  CARDIOVASCULAR: S1, S2 normal. No murmurs, rubs, or gallops.  ABDOMEN: Soft, nontender, nondistended. Bowel sounds present. No organomegaly or mass.  EXTREMITIES: No cyanosis, clubbing or edema b/l.    NEUROLOGIC: Cranial nerves II through XII are intact. No focal Motor or sensory deficits b/l.   PSYCHIATRIC:  patient is alert and oriented x 3.  SKIN: No obvious rash, lesion, or ulcer.   LABORATORY PANEL:  CBC  Recent Labs Lab 02/28/17 0543 03/01/17 0626  WBC 11.0  --   HGB 7.3* 8.4*  HCT 22.7*  --   PLT 212  --     Chemistries   Recent Labs Lab 02/28/17 0543  NA 137  K 3.9  CL 94*  CO2 31  GLUCOSE 104*  BUN 43*  CREATININE 1.54*  CALCIUM 8.2*   Cardiac Enzymes  Recent Labs Lab 02/27/17 0722  TROPONINI <0.03   RADIOLOGY:  Dg Chest 2 View  Result Date: 02/27/2017 CLINICAL  DATA:  Shortness of breath, weakness, pedal edema for a week EXAM: CHEST  2 VIEW COMPARISON:  Chest x-ray of 01/31/2017 FINDINGS: Moderate cardiomegaly is again noted and there is now more opacity at the left lung base, consistent with left pleural effusion and left basilar atelectasis. Pneumonia would be  difficult to exclude. There is mild pulmonary vascular congestion present and permanent pacemaker remains. These changes may all be due to congestive heart failure, but superimposed pneumonia at the left lung base cannot be excluded. No bony abnormality is seen. IMPRESSION: 1. Increased opacity at the left lung base most consistent with left pleural effusion, left basilar atelectasis, and possibly left basilar pneumonia. 2. Cardiomegaly and mild pulmonary vascular congestion. Electronically Signed   By: Ivar Drape M.D.   On: 02/27/2017 08:18   ASSESSMENT AND PLAN:  Demara Lover  is a 83 y.o. female with a known history of D CHF, COPD, DM, HTN recent hip fracture, Afib, not on Eliquis due to GI bleed here with SOB and worsening LE eema. Has chronic orthopnea. Here CXR shows LLL PNA and pulm edema  * LLL Pneumonia with acute on chronic hypoxic respiratory failure -due to multiple allergies she is on meropenem and zthromax - MRSA PCR negative -BC neg so far  * Acute on chronic diastolic chf with increasing Left pleural effusion - IV Lasix, Beta blockers - Input and Output - Counseled to limit fluids and Salt - Monitor Bun/Cr and Potassium -echo recently done shows EF 60-65% -Cardiology consult appreciated---spoke with Dr Rockey Situ   *Acute on chronic anemia. Patient has anemia of chronic disease. -EGD done early part of June 2018 negative other than mild nonerosive gastropathy -Patient recently got one unit of blood transfusion last admission she was discharged with hemoglobin of 8.9 -Hemoglobin today 7.3. Given her symptoms of acute and chronic congestive heart failure we'll go ahead and transfuse 1 unit of blood transfusion today. Transfuse thereafter as needed. - 8.9 (last week)---7.3 (admission)---1 unit BT ---8.4  * DM2 Home meds and SSI  * CKD3 Stable Monitor while being diuresed  * HTN Continue home meds  * COPD No wheezing  Pt does not want to go to rehab.  Case  discussed with Care Management/Social Worker. Management plans discussed with the patient, family and they are in agreement.  CODE STATUS: DNR  DVT Prophylaxis: SCD/TEDS  TOTAL TIME TAKING CARE OF THIS PATIENT: 30 minutes.  >50% time spent on counselling and coordination of care  POSSIBLE D/C IN 1-2 DAYS, DEPENDING ON CLINICAL CONDITION.  Note: This dictation was prepared with Dragon dictation along with smaller phrase technology. Any transcriptional errors that result from this process are unintentional.  Margurette Brener M.D on 03/01/2017 at 7:52 AM  Between 7am to 6pm - Pager - [phone]  After 6pm go to [email or web address] - password [facility]  Office  [phone]  CC: Primary care physician; Tracie Harrier, MD

## 2017-03-01 NOTE — Progress Notes (Signed)
Physical Therapy Treatment Patient Details Name: Regina Burke MRN: [id number] DOB: [birth year] Today's Date: 03/01/2017    History of Present Illness 83 y/o female presents to ED with SOB, weakness, and edema in all 4 extremeties. Pt was just dc home a couple days ago from [facility] s/p a fall and L hip fx with surgical pinning surgery on 02/01/17. PMH includes CHF, COPD (O2 dependent), DM, HTN, a-fib, chronic orthopnea, UTI, anemia, pacemaker, CKD, CAD, diverticulitis, Cushing's, depression, gout, HOH, lung cancer, and MI.    PT Comments    Agrees on second attempt. Session focused on mobility skills as pt stated she will not go to rehab and wants to return home.  She stated she primarily used wheelchair at home and would only walk into the bathroom as her wheelchair did not fit in the doorway.  She was able to get to edge of bed with supervision.  She transferred to the left to commode at bedside with min guard/min assist.  She was able to take care of her own self care in a seated position.  She then stood and transferred 180 degrees to right to recliner with min guard/min assist.  Overall does well with transfers but encouraged her to have her son with her at all times initially upon discharge if she continues to decline rehab.  Gait remains limited due to general fatigue and weakness.  She was able to complete toe raises x 10 and marching in place x 10 bilaterally before sitting and remaining up in recliner.    If pt continues to decline rehab, she would benefit from HHPT - she stated she initially refused but after discussing with pt she stated "I need it".  Stated her son is with her 24 hours a day and can assist with mobility skills.   Follow Up Recommendations  SNF     Equipment Recommendations  3in1 (PT)    Recommendations for Other Services       Precautions / Restrictions Precautions Precautions: Fall;ICD/Pacemaker Restrictions Weight Bearing Restrictions: No    Mobility  Bed  Mobility Overal bed mobility: Needs Assistance Bed Mobility: Sit to Supine     Supine to sit: Min guard     General bed mobility comments: with rail and HOB up  Transfers Overall transfer level: Needs assistance Equipment used: Rolling walker (2 wheeled)     Stand pivot transfers: Min guard;Min assist       General transfer comment: transfered bed to commode then commode to recliner with 180 degree turn and several steps.  No buckling or LOB n oted.  Ambulation/Gait Ambulation/Gait assistance: Min guard;Min assist Ambulation Distance (Feet): 5 Feet Assistive device: Rolling walker (2 wheeled)     Gait velocity interpretation: <1.8 ft/sec, indicative of risk for recurrent falls General Gait Details: generally steady but requires +1 assist for safety   Stairs            Wheelchair Mobility    Modified Rankin (Stroke Patients Only)       Balance Overall balance assessment: Needs assistance;History of Falls Sitting-balance support: Bilateral upper extremity supported;Feet supported Sitting balance-Leahy Scale: Good     Standing balance support: Bilateral upper extremity supported Standing balance-Leahy Scale: Fair                              Cognition Arousal/Alertness: Awake/alert Behavior During Therapy: WFL for tasks assessed/performed Overall Cognitive Status: Within Functional Limits for tasks assessed  Exercises      General Comments        Pertinent Vitals/Pain Pain Assessment: 0-10 Pain Score: 4  Pain Location: L hip Pain Descriptors / Indicators: Grimacing;Guarding;Operative site guarding Pain Intervention(s): Limited activity within patient's tolerance;Monitored during session    Home Living                      Prior Function            PT Goals (current goals can now be found in the care plan section) Progress towards PT goals: Progressing toward  goals    Frequency    Min 2X/week      PT Plan Current plan remains appropriate    Co-evaluation              AM-PAC PT "6 Clicks" Daily Activity  Outcome Measure  Difficulty turning over in bed (including adjusting bedclothes, sheets and blankets)?: A Little Difficulty moving from lying on back to sitting on the side of the bed? : A Little Difficulty sitting down on and standing up from a chair with arms (e.g., wheelchair, bedside commode, etc,.)?: Total Help needed moving to and from a bed to chair (including a wheelchair)?: A Little Help needed walking in hospital room?: A Lot Help needed climbing 3-5 steps with a railing? : Total 6 Click Score: 13    End of Session Equipment Utilized During Treatment: Gait belt;Oxygen Activity Tolerance: Patient limited by fatigue Patient left: in chair;with chair alarm set;with call bell/phone within reach Nurse Communication: Mobility status Pain - Right/Left: Left Pain - part of body: Hip     Time: 5701-7793 PT Time Calculation (min) (ACUTE ONLY): 23 min  Charges:  $Therapeutic Activity: 23-37 mins                    G Codes:       Chesley Noon, PTA 03/01/17, 11:46 AM

## 2017-03-01 NOTE — Consult Note (Signed)
Consultation Note Date: 03/01/2017   Patient Name: Regina Burke  DOB: [birth year]  MRN: [id number]  Age / Sex: 83 y.o., female  PCP: Tracie Harrier, MD Referring Physician: Fritzi Mandes, MD  Reason for Consultation: Establishing goals of care  HPI/Patient Profile: 82 y.o. female  with past medical history of CHF, COPD, Stage IV CKD, DM2, chronic anemia, hip fracture, a. fib admitted on 02/27/2017 with acute respiratory distress. Workup reveals pneumonia with CHF and COPD exacerbation. Patient has had 4 hospital admissions in the last six months. 5/18- chest pain (presumed pleuritic, small effusion noted on echo); 5/29- SOB pleural effusion r/t CHF and acute on chronic renal failure; 6/6 d/t hip fracture (s/p pinning on 6/7- [facility]'d [facility]); and now this admission. Palliative medicine consulted for [place].  Clinical Assessment and Goals of Care: Met with patient at bedside.   I introduced Palliative Medicine as specialized medical care for people living with serious illness. It focuses on providing relief from the symptoms and stress of a serious illness. The goal is to improve quality of life for both the patient and the family.  We discussed a brief life review of the patient.  Then I assessed functional and nutritional status at home by gathering history. She does not ambulate. Uses a wheelchair to get around her home. Requires assistance with bathing, but is able to toilet herself. She eats and drinks well, but has noted a decrease in her appetite. She is on home O2, quickly becomes fatigued and SOB with activity.   We discussed their current illness and what it means in the larger context of their on-going co-morbidities.  Natural disease trajectory and expectations at EOL were discussed. She feels that she "has been sick all the time". She is tired of coming into the hospital. Her primary GOC at this  point are to focus on quality of her day to day life. She states that "if fluid built back up in my lungs and heart, I do not want to come in the hospital. I want to be made comfortable, like they did for [facility], and let me die". She denies symptoms of depression or anxiety. She sleeps well, but usually requires sleeping in a recliner sitting up- unable to lie down in a bed. She does not want to continue physical therapy.   We discussed that this time may be coming sooner than she expects. Noted frequent hospitalizations and she feels that with each hospitalization she declines in status. She feels she is declining towards end of life.   I attempted to elicit values and goals of care important to the patient. She enjoys spending time with her Grandkids and her family. She enjoys going to the grocery store, but hasn't been able to go in the last several months.  The difference between aggressive medical intervention and comfort care was considered in light of the patient's goals of care.   We discussed Hospice services and how they may benefit her GOC and symptom management. She would like to  meet with Hospice representative and be screened for eligibility.  Hospice and Palliative Care services outpatient were explained and offered.   Primary Decision Maker PATIENT    SUMMARY OF RECOMMENDATIONS  -Hospice consult for eligibility- Patient has had frequent hospitalizations and significant declines in functional status- she expresses desire to avoid future hospitalizations and for comfort measures- remaining in home is important to her, she does not want to go back to a rehab or to a SNF    Code Status/Advance Care Planning:  DNR  Prognosis:    < 6 months d/t CHF, COPD, CKD, recent hip fracture with significant decline in functional status, multiple recent hospitalizations with decrease in functional status with each hospitalization  Discharge Planning: Home with Hospice  Primary  Diagnoses: Present on Admission: . Pneumonia   I have reviewed the medical record, interviewed the patient and family, and examined the patient. The following aspects are pertinent.  Past Medical History:  Diagnosis Date  . Anemia   . Arthritis   . Asthma   . Cardiac resynchronization therapy pacemaker (CRT-P) in place    a. 03/31/16:  Medtronic Percepta Quad CRT-P MRI SureScan (serial Number [id number]) device.  . Chronic diastolic CHF (congestive heart failure) ([place])    a. 10/2015 Echo: EF 55-65%, Gr1 DD, mild MR, mildly dil LA, nl RV fxn, nl PASP;  b. 03/2016 Echo: EF 30-35%, mod MR, mod dil LA; c. 12/2016 Echo: EF 60-65%, no rwma, mild AS, mildly dil LA, PASP 51mHg.  . CKD (chronic kidney disease), stage IV ([place]   . COPD (chronic obstructive pulmonary disease) ([place]   . Coronary artery disease    a. 11/2014 NSTEMI/PCI: LM nl, LAD 583mD1 30, LCX mild dzs, OM1 20p, OM2 2035mM3 90p (2.25x8 Promus Premier DES), RCA nl.   . Cough    CHRONIC AT NIGHT  . Cushing's disease (HCCEm[place] . Depression   . Diverticulitis   . Edema    FEET/LEGS  . GERD (gastroesophageal reflux disease)   . Gout   . History of hiatal hernia   . History of pneumonia   . HLD (hyperlipidemia)   . HOH (hard of hearing)   . Hypertensive heart disease   . Lung cancer (HCCCoronitax'd 2014   S/P radiation 2015  . Migraine   . Mixed Ischemic and Nonischemic Cardiomyopathy (HCCManor  a. 03/2016 Echo: EF 30-35%;  b. 12/2016 Echo: EF 60-65%.  . Moderate mitral regurgitation   . Multiple allergies   . Myocardial infarction (HCCCh[place] . Oxygen deficiency    2LITERS  . Persistent atrial fibrillation (HCCHerminie  a. CHADS2VASc ==> 7 (CHF, HTN, age x 2, DM, vascular disease, and gender)--was on renal dosed eliquis but this was d/c'd in 12/2016 in setting of dark stools/anemia;  c. 03/2016 s/p AVN and MDT BiV ICD placement.  . Presence of permanent cardiac pacemaker   . S/P AV nodal ablation    a. on 03/31/16 for persistant afib with  CRT-P placement  . Sleep apnea   . Type II diabetes mellitus (HCCGreen Valley  Social History   Social History  . Marital status: Widowed    Spouse name: N/A  . Number of children: N/A  . Years of education: N/A   Occupational History  . retired    Social History Main Topics  . Smoking status: Former Smoker    Packs/day: 1.00    Years: 45.00    Types: Cigarettes  Quit date: 04/25/1994  . Smokeless tobacco: Never Used  . Alcohol use No     Comment: 12/23/2014 "might have a couple mixed drinks/year"  . Drug use: No  . Sexual activity: No   Other Topics Concern  . None   Social History Narrative   Lives locally with son.  Does not routinely exercise.   Family History  Problem Relation Age of Onset  . Heart disease Mother   . Diabetes Mother   . Osteoarthritis Mother   . Hypertension Mother   . Heart disease Father   . Hypertension Father   . COPD Brother    Scheduled Meds: . ALPRAZolam  0.5 mg Oral QHS  . atorvastatin  20 mg Oral QPM  . azithromycin  500 mg Oral Daily  . feeding supplement (ENSURE ENLIVE)  237 mL Oral TID WC  . fenofibrate  54 mg Oral Daily  . ferrous sulfate  325 mg Oral BID WC  . furosemide  40 mg Intravenous BID  . insulin aspart  0-5 Units Subcutaneous QHS  . insulin aspart  0-9 Units Subcutaneous TID WC  . metoprolol succinate  12.5 mg Oral Daily  . pantoprazole  40 mg Oral QPM  . sertraline  50 mg Oral Daily  . sodium chloride flush  3 mL Intravenous Q12H   Continuous Infusions: . meropenem     PRN Meds:.acetaminophen **OR** acetaminophen, cyclobenzaprine, fluticasone furoate-vilanterol, levalbuterol, nitroGLYCERIN, ondansetron **OR** ondansetron (ZOFRAN) IV, polyethylene glycol, tiotropium, traMADol Medications Prior to Admission:  Prior to Admission medications   Medication Sig Start Date End Date Taking? Authorizing Provider  ALPRAZolam Duanne Moron) 0.5 MG tablet Take 0.5 mg by mouth at bedtime. 12/27/16  Yes [provider]    atorvastatin (LIPITOR) 20 MG tablet Take 1 tablet (20 mg total) by mouth every evening. 12/03/15  Yes Gollan, Kathlene November, MD  cholecalciferol (VITAMIN D) 400 UNITS TABS tablet Take 400 Units by mouth daily.    Yes [provider]  cyanocobalamin 500 MCG tablet Take 500 mcg by mouth daily.    Yes [provider]  fenofibrate (TRICOR) 48 MG tablet Take 48 mg by mouth every evening.    Yes [provider]  ferrous sulfate 325 (65 FE) MG EC tablet Take 325 mg by mouth daily with breakfast.    Yes [provider]  furosemide (LASIX) 40 MG tablet Take 1 tablet (40 mg total) by mouth daily. 04/01/16  Yes Eileen Stanford, PA-C  metoprolol succinate (TOPROL XL) 25 MG 24 hr tablet Take 0.5 tablets (12.5 mg total) by mouth daily. 12/19/16  Yes Minna Merritts, MD  Multiple Vitamins-Minerals (PRESERVISION AREDS 2) CAPS Take 1 capsule by mouth daily.   Yes [provider]  pantoprazole (PROTONIX) 40 MG tablet Take 40 mg by mouth every evening.    Yes [provider]  sertraline (ZOLOFT) 50 MG tablet Take 1 tablet (50 mg total) by mouth daily. 03/24/16  Yes Gladstone Lighter, MD  fluticasone furoate-vilanterol (BREO ELLIPTA) 100-25 MCG/INH AEPB Inhale 1 puff into the lungs daily as needed.     [provider]  nitroGLYCERIN (NITROSTAT) 0.4 MG SL tablet Place 1 tablet (0.4 mg total) under the tongue every 5 (five) minutes as needed for chest pain. 12/24/14   Barrett, Evelene Croon, PA-C  tiotropium (SPIRIVA) 18 MCG inhalation capsule Place 18 mcg into inhaler and inhale daily as needed.     [provider]  traMADol (ULTRAM) 50 MG tablet Take 50 mg by mouth  every 6 (six) hours as needed.    [provider]   Allergies  Allergen Reactions  . Ciprofloxacin Shortness Of Breath, Itching and Rash  . Doxycycline Shortness Of Breath, Itching and Rash  . Penicillins Shortness Of Breath, Itching, Rash and Other (See Comments)    Has patient had  a PCN reaction causing immediate rash, facial/tongue/throat swelling, SOB or lightheadedness with hypotension: Yes Has patient had a PCN reaction causing severe rash involving mucus membranes or skin necrosis: No Has patient had a PCN reaction that required hospitalization No Has patient had a PCN reaction occurring within the last 10 years: No If all of the above answers are "NO", then may proceed with Cephalosporin use.  . Sulfa Antibiotics Shortness Of Breath, Itching and Rash  . Latex Itching  . Morphine And Related Itching  . Albuterol Sulfate   . Macrobid [Nitrofurantoin Macrocrystal] Itching  . Cefuroxime Rash    Blisters in mouth   Review of Systems  Constitutional: Positive for appetite change.  Respiratory: Positive for shortness of breath.   Cardiovascular: Positive for leg swelling.  Gastrointestinal: Negative for constipation.  Psychiatric/Behavioral: Negative for sleep disturbance and suicidal ideas. The patient is not nervous/anxious.     Physical Exam  Constitutional: She is oriented to person, place, and time. She appears well-developed and well-nourished. No distress.  Cardiovascular: Normal rate.   Neurological: She is alert and oriented to person, place, and time.  Skin: There is pallor.  Psychiatric: She has a normal mood and affect. Her behavior is normal. Judgment and thought content normal.  Nursing note and vitals reviewed.   Vital Signs: BP (!) 146/47   Pulse 71   Temp 97.8 F (36.6 C) (Oral)   Resp 18   Ht '5\' 4"'$  (1.626 m)   Wt 80.9 kg (178 lb 4 oz)   SpO2 92%   BMI 30.60 kg/m  Pain Assessment: No/denies pain   Pain Score: 2    SpO2: SpO2: 92 % O2 Device:SpO2: 92 % O2 Flow Rate: .O2 Flow Rate (L/min): 2 L/min  IO: Intake/output summary:  Intake/Output Summary (Last 24 hours) at 03/01/17 1352 Last data filed at 03/01/17 1200  Gross per 24 hour  Intake              560 ml  Output             1050 ml  Net             -490 ml    LBM:  Last BM Date: 02/26/17 Baseline Weight: Weight: 81.2 kg (179 lb) Most recent weight: Weight: 80.9 kg (178 lb 4 oz)     Palliative Assessment/Data: PPS: 30%   Flowsheet Rows     Most Recent Value  Intake Tab  Referral Department  Hospitalist  Unit at Time of Referral  Oncology Unit  Palliative Care Primary Diagnosis  Cardiac  Date Notified  02/28/17  Palliative Care Type  New Palliative care  Reason for referral  Clarify Goals of Care  Date of Admission  02/27/17  # of days IP prior to Palliative referral  1  Clinical Assessment  Psychosocial & Spiritual Assessment  Palliative Care Outcomes      Thank you for this consult. Palliative medicine will continue to follow and assist as needed.    Time Total: 85 minutes Greater than 50%  of this time was spent counseling and coordinating care related to the above assessment and plan.  Signed by: Mariana Kaufman, AGNP-C  Palliative Medicine    Please contact Palliative Medicine Team phone at [phone] for questions and concerns.  For individual provider: See Shea Evans

## 2017-03-01 NOTE — Progress Notes (Signed)
Progress Note  Patient Name: Regina Burke Date of Encounter: 03/01/2017  Primary Cardiologist: Rockey Situ, tim  Subjective   Reports that she feels better today Weak, less shortness of breath Extra dose of Lasix provided yesterday at noon with nebulizer treatment, PRBC by blood cell transfusion Reports she was receiving Epogen monthly, has not been able to get back to oncology, missed several appointments   Inpatient Medications    Scheduled Meds: . ALPRAZolam  0.5 mg Oral QHS  . atorvastatin  20 mg Oral QPM  . azithromycin  500 mg Oral Daily  . feeding supplement (ENSURE ENLIVE)  237 mL Oral TID WC  . fenofibrate  54 mg Oral Daily  . ferrous sulfate  325 mg Oral BID WC  . furosemide  40 mg Intravenous BID  . insulin aspart  0-5 Units Subcutaneous QHS  . insulin aspart  0-9 Units Subcutaneous TID WC  . metoprolol succinate  12.5 mg Oral Daily  . pantoprazole  40 mg Oral QPM  . sertraline  50 mg Oral Daily  . sodium chloride flush  3 mL Intravenous Q12H   Continuous Infusions: . meropenem 500 mg (03/01/17 1741)   PRN Meds: acetaminophen **OR** acetaminophen, cyclobenzaprine, fluticasone furoate-vilanterol, levalbuterol, nitroGLYCERIN, ondansetron **OR** ondansetron (ZOFRAN) IV, polyethylene glycol, tiotropium, traMADol   Vital Signs    Vitals:   02/28/17 2158 03/01/17 0414 03/01/17 0817 03/01/17 1528  BP: 138/62 (!) 149/56 (!) 146/47 (!) 142/43  Pulse: 75 70 71 72  Resp: 17 18  18   Temp: 97.9 F (36.6 C) 97.8 F (36.6 C)  97.9 F (36.6 C)  TempSrc: Oral Oral  Oral  SpO2: 99% 98% 92% 98%  Weight:  178 lb 4 oz (80.9 kg)    Height:        Intake/Output Summary (Last 24 hours) at 03/01/17 1751 Last data filed at 03/01/17 1352  Gross per 24 hour  Intake              800 ml  Output              850 ml  Net              -50 ml   Filed Weights   02/27/17 1301 02/28/17 0500 03/01/17 0414  Weight: 172 lb 11.2 oz (78.3 kg) 169 lb (76.7 kg) 178 lb 4 oz (80.9 kg)     Telemetry    Not on telemetry - Personally Reviewed  ECG     - Personally Reviewed  Physical Exam   GEN: No acute distress.  Comfortable lying in bed Neck: No JVD Cardiac: RRR, no murmurs, rubs, or gallops.  Respiratory: Clear to auscultation bilaterally. GI: Soft, nontender, non-distended  MS: No edema; No deformity. Neuro:  Nonfocal  Psych: Normal affect   Labs    Chemistry Recent Labs Lab 02/27/17 0722 02/28/17 0543  NA 138 137  K 4.0 3.9  CL 95* 94*  CO2 31 31  GLUCOSE 142* 104*  BUN 47* 43*  CREATININE 1.80* 1.54*  CALCIUM 8.3* 8.2*  GFRNONAA 25* 30*  GFRAA 29* 35*  ANIONGAP 12 12     Hematology Recent Labs Lab 02/27/17 0722 02/28/17 0543 03/01/17 0626  WBC 15.3* 11.0  --   RBC 3.08* 2.76*  --   HGB 7.9* 7.3* 8.4*  HCT 25.1* 22.7*  --   MCV 81.4 82.2  --   MCH 25.7* 26.4  --   MCHC 31.6* 32.1  --   RDW 19.3*  19.0*  --   PLT 254 212  --     Cardiac Enzymes Recent Labs Lab 02/27/17 0722  TROPONINI <0.03   No results for input(s): TROPIPOC in the last 168 hours.   BNPNo results for input(s): BNP, PROBNP in the last 168 hours.   DDimer No results for input(s): DDIMER in the last 168 hours.   Radiology    No results found.  Cardiac Studies     Patient Profile     Regina Burke is a 83 y.o. female with a hx of  COPD on chronic oxygen,  coronary artery diseasewith hospitalization April 2016 stent placed to marginal branch of the circumflex, Persistent atrial fibrillation, not on anticoagulation secondary to GI bleed 03/31/2016, AV node ablation, Biventricular ICD implantation severe anemia, previous epo shot lung cancer, diabetes mellitus, episode of acute respiratory distress requiring hospitalization  who is being seen today for the evaluation of acute respiratory distress at the request of Dr. Fritzi Mandes  Assessment & Plan    1. Acute respiratory distress Etiology is Multifactorial including anemia, underlying  COPD, acute on chronic diastolic CHF Receive transfusion yesterday On Xopenex treatments  Would continue Lasix 40 IV twice a day  consider changing to torsemide 40 daily as an outpatient  2. Anemia Chronic issue, recent GI bleed Blood transfusion yesterday Needs follow-up with hematology/nephrology, Procrit monthly  3) chronic renal failure Creatinine likely at baseline   4) COPD Less likely pneumonia Small pleural effusion on the left likely secondary to anemia and CHF symptoms Would hold off on thoracentesis given minimal effusion and high risk to complication  5) diabetes type 2  On home medications, sliding scale   Total encounter time more than 25 minutes  Greater than 50% was spent in counseling and coordination of care with the patient   Signed, Ida Rogue, MD  03/01/2017, 5:51 PM

## 2017-03-01 NOTE — Progress Notes (Signed)
New referral for [facility] services at home received from [facility] following a Palliative Medicine consult.  Patient is an 83 year old woman  past medical history of CHF, COPD, Stage IV CKD, DM2, chronic anemia, hip fracture, a. fib admitted on 02/27/2017 with acute respiratory distress. Workup revealed pneumonia with CHF and COPD exacerbation. Patient has had 4 hospital admissions in the last six months.: 5/18 with chest pain (presumed pleuritic, small effusion noted on echo); 5/29- SOB pleural effusion r/t CHF and acute on chronic renal failure; 6/6 d/t hip fracture (s/p pinning on 6/7- [facility]'d [facility]); and now this admission. Palliative medicine was consulted for goals of care. Patient and her son [name] met with Palliative Medicine NP Mariana Kaufman and have chosen to return home with hospice services, focusing on comfort and no further hospitalizations.  Writer met in the room with Mrs. Pyka and her son [name] (with whom she lives) to initiate education regarding hospice services, philosophy and team approach to care with good understanding voiced. Mrs. Basham was alert and oriented and participated in the conversation. Patient [facility] the [facility]. No DME needs at this time. Plan is for discharge home via [place] per [place]. Hospice information and contact number left with [facility]. Patient information faxed to referral. Will continue to follow through final disposition. Thank you. Flo Shanks RN, BSN, [facility] [place],  hospital Liaison [phone] c

## 2017-03-01 NOTE — Care Management (Signed)
Received referral from Mariana Kaufman, NP for Palliative Care for Hospice services in the home. Discussed agencies with Ms. Kubicek and son [name] at the bedside. [place]. [name] requested to be present when representative spoke with his mother about services in the home. Will update Flo Shanks, RN representative for [facility]. Shelbie Ammons RN MSN CCM Care Management [phone]

## 2017-03-01 NOTE — Care Management (Signed)
Admitted to [facility]  diagnosis of pneumonia. Lives with son, [name], in the home.  Sees Drs Vira Agar and [facility] for physician care. Prescriptions are filled at [facility] on [address]. Chronic home oxygen x 5 years per [place], uses 2 liters continuous. Discharged from this facility to [facility] 02/05/17. Followed by [facility] the last 2 weeks.  Rolling walker, rollator, 4 canes, grab bars and wheelchair in the home. Takes care of all basic activities of daily living herself, doesn't drive. "A lot of falls." Fair appetite. Family will transport. Physical therapy is recommending skilled facility. Regina Burke wants to go home. States she doesn't want home health in the home. Discussed that she could probably benefit from therapy in the home. States she will think about it Shelbie Ammons RN MSN [place] [facility] [phone]

## 2017-03-02 ENCOUNTER — Ambulatory Visit: Payer: Medicare Other | Admitting: Cardiovascular Disease

## 2017-03-02 LAB — GLUCOSE, CAPILLARY
Glucose-Capillary: 118 mg/dL — ABNORMAL HIGH (ref 65–99)
Glucose-Capillary: 93 mg/dL (ref 65–99)

## 2017-03-02 LAB — CULTURE, BLOOD (ROUTINE X 2): SPECIAL REQUESTS: ADEQUATE

## 2017-03-02 LAB — URINE CULTURE: Culture: >=100000 — AB

## 2017-03-02 MED ORDER — LEVALBUTEROL HCL 1.25 MG/0.5ML IN NEBU: 1.2500 mg | INHALATION_SOLUTION | Freq: Four times a day (QID) | RESPIRATORY_TRACT | 12 refills | Status: AC | PRN

## 2017-03-02 MED ORDER — ENSURE ENLIVE PO LIQD: 237.0000 mL | Freq: Three times a day (TID) | ORAL | 12 refills | Status: AC

## 2017-03-02 MED ORDER — FOSFOMYCIN TROMETHAMINE 3 G PO PACK
3.0000 g | PACK | Freq: Once | ORAL | Status: AC
  Administered 2017-03-02: 13:00:00 3 g via ORAL
  Filled 2017-03-02: qty 3

## 2017-03-02 MED ORDER — TORSEMIDE 20 MG PO TABS: 40.0000 mg | ORAL_TABLET | Freq: Every day | ORAL | Status: DC

## 2017-03-02 MED ORDER — TORSEMIDE 20 MG PO TABS: 40.0000 mg | ORAL_TABLET | Freq: Every day | ORAL | 0 refills | Status: DC

## 2017-03-02 MED ORDER — AZITHROMYCIN 500 MG PO TABS: 500.0000 mg | ORAL_TABLET | Freq: Every day | ORAL | 0 refills | Status: DC

## 2017-03-02 MED ORDER — FUROSEMIDE 40 MG PO TABS: 40.0000 mg | ORAL_TABLET | Freq: Every day | ORAL | Status: DC

## 2017-03-02 NOTE — Progress Notes (Signed)
Given her multiple medical antibiotic allergies will give her those all fosfomycin for UTI 1 here in the hospital.

## 2017-03-02 NOTE — Clinical Social Work Note (Signed)
Pt is ready for discharge today and will return home with hospice services following. Arrangements have been made by [facility] and Hospice Liaison is aware and following. CSW is signing off as no further needs identified.   Darden Dates, MSW, LCSW  Clinical Social Worker  [phone]

## 2017-03-02 NOTE — Care Management Important Message (Signed)
Important Message  Patient Details  Name: Regina Burke MRN: [id number] Date of Birth: 06-16-1933   Medicare Important Message Given:  Yes    Jolly Mango, RN 03/02/2017, 9:15 AM

## 2017-03-02 NOTE — Progress Notes (Signed)
[facility]  ROUNDING NOTE   Subjective:   Patient wants Hospice to follow her at home due to her congestive heart failure and many other co morbidities.   Objective:  Vital signs in last 24 hours:  Temp:  [97.9 F (36.6 C)-98 F (36.7 C)] 98 F (36.7 C) (07/06 0503) Pulse Rate:  [71-72] 71 (07/06 0503) Resp:  [16-18] 16 (07/06 0503) BP: (130-153)/(43-66) 153/66 (07/06 0503) SpO2:  [98 %-100 %] 99 % (07/06 0503) Weight:  [79.8 kg (176 lb)] 79.8 kg (176 lb) (07/06 0456)  Weight change: -1.021 kg (-2 lb 4 oz) Filed Weights   02/28/17 0500 03/01/17 0414 03/02/17 0456  Weight: 76.7 kg (169 lb) 80.9 kg (178 lb 4 oz) 79.8 kg (176 lb)    Intake/Output: I/O last 3 completed shifts: In: 5885 [P.O.:720; Blood:320; IV Piggyback:50] Out: 0277 [AJOIN:8676]   Intake/Output this shift:  No intake/output data recorded.  Physical Exam: General: NAD, laying in bed  Head: Normocephalic, atraumatic. Moist oral mucosal membranes  Eyes: Anicteric, PERRL  Neck: Supple, trachea midline  Lungs:  clear  Heart: Regular rate and rhythm  Abdomen:  Soft, nontender,   Extremities: no peripheral edema.  Neurologic: Nonfocal, moving all four extremities  Skin: No lesions       Basic Metabolic Panel:  Recent Labs Lab 02/27/17 0722 02/28/17 0543  NA 138 137  K 4.0 3.9  CL 95* 94*  CO2 31 31  GLUCOSE 142* 104*  BUN 47* 43*  CREATININE 1.80* 1.54*  CALCIUM 8.3* 8.2*    Liver Function Tests: No results for input(s): AST, ALT, ALKPHOS, BILITOT, PROT, ALBUMIN in the last 168 hours. No results for input(s): LIPASE, AMYLASE in the last 168 hours. No results for input(s): AMMONIA in the last 168 hours.  CBC:  Recent Labs Lab 02/27/17 0722 02/28/17 0543 03/01/17 0626  WBC 15.3* 11.0  --   HGB 7.9* 7.3* 8.4*  HCT 25.1* 22.7*  --   MCV 81.4 82.2  --   PLT 254 212  --     Cardiac Enzymes:  Recent Labs Lab 02/27/17 0722  TROPONINI <0.03    BNP: Invalid  input(s): POCBNP  CBG:  Recent Labs Lab 03/01/17 1150 03/01/17 1659 03/01/17 1933 03/02/17 0735 03/02/17 1137  GLUCAP 122* 143* 144* 118* 24    Microbiology: Results for orders placed or performed during the hospital encounter of 02/27/17  Urine culture     Status: Abnormal   Collection Time: 02/27/17  7:22 AM  Result Value Ref Range Status   Specimen Description URINE, RANDOM  Final   Special Requests NONE  Final   Culture >=100,000 COLONIES/mL ESCHERICHIA COLI (A)  Final   Report Status 03/02/2017 FINAL  Final   Organism ID, Bacteria ESCHERICHIA COLI (A)  Final      Susceptibility   Escherichia coli - MIC*    AMPICILLIN 16 INTERMEDIATE Intermediate     CEFAZOLIN <=4 SENSITIVE Sensitive     CEFTRIAXONE <=1 SENSITIVE Sensitive     CIPROFLOXACIN <=0.25 SENSITIVE Sensitive     GENTAMICIN <=1 SENSITIVE Sensitive     IMIPENEM 0.5 SENSITIVE Sensitive     NITROFURANTOIN <=16 SENSITIVE Sensitive     TRIMETH/SULFA <=20 SENSITIVE Sensitive     AMPICILLIN/SULBACTAM 4 SENSITIVE Sensitive     PIP/TAZO <=4 SENSITIVE Sensitive     Extended ESBL NEGATIVE Sensitive     * >=100,000 COLONIES/mL ESCHERICHIA COLI  Blood Culture (routine x 2)     Status: Abnormal  Collection Time: 02/27/17  8:58 AM  Result Value Ref Range Status   Specimen Description BLOOD LEFT EXTERNAL JUGULAR  Final   Special Requests   Final    BOTTLES DRAWN AEROBIC AND ANAEROBIC Blood Culture adequate volume   Culture  Setup Time   Final    GRAM POSITIVE COCCI ANAEROBIC BOTTLE ONLY CRITICAL RESULT CALLED TO, READ BACK BY AND VERIFIED WITH: SCOTT CHRISTY ON 02/28/17 AT 1031 QSD    Culture (A)  Final    STAPHYLOCOCCUS SPECIES (COAGULASE NEGATIVE) THE SIGNIFICANCE OF ISOLATING THIS ORGANISM FROM A SINGLE SET OF BLOOD CULTURES WHEN MULTIPLE SETS ARE DRAWN IS UNCERTAIN. PLEASE NOTIFY THE MICROBIOLOGY DEPARTMENT WITHIN ONE WEEK IF SPECIATION AND SENSITIVITIES ARE REQUIRED. Performed at [facility], [place]  [address] [place], [place] [address]    Report Status 03/02/2017 FINAL  Final  Blood Culture (routine x 2)     Status: None (Preliminary result)   Collection Time: 02/27/17  8:58 AM  Result Value Ref Range Status   Specimen Description BLOOD BLOOD LEFT FOREARM  Final   Special Requests   Final    BOTTLES DRAWN AEROBIC AND ANAEROBIC Blood Culture adequate volume   Culture NO GROWTH 3 DAYS  Final   Report Status PENDING  Incomplete  Blood Culture ID Panel (Reflexed)     Status: Abnormal   Collection Time: 02/27/17  8:58 AM  Result Value Ref Range Status   Enterococcus species NOT DETECTED NOT DETECTED Final   Listeria monocytogenes NOT DETECTED NOT DETECTED Final   Staphylococcus species DETECTED (A) NOT DETECTED Final    Comment: Methicillin (oxacillin) resistant coagulase negative staphylococcus. Possible blood culture contaminant (unless isolated from more than one blood culture draw or clinical case suggests pathogenicity). No antibiotic treatment is indicated for blood  culture contaminants. CRITICAL RESULT CALLED TO, READ BACK BY AND VERIFIED WITH: SCOTT CHRISTY ON 02/28/17 AT 1031 QSD    Staphylococcus aureus NOT DETECTED NOT DETECTED Final   Methicillin resistance DETECTED (A) NOT DETECTED Final    Comment: CRITICAL RESULT CALLED TO, READ BACK BY AND VERIFIED WITH: SCOTT CHRISTY ON 02/28/17 AT 1031 QSD    Streptococcus species NOT DETECTED NOT DETECTED Final   Streptococcus agalactiae NOT DETECTED NOT DETECTED Final   Streptococcus pneumoniae NOT DETECTED NOT DETECTED Final   Streptococcus pyogenes NOT DETECTED NOT DETECTED Final   Acinetobacter baumannii NOT DETECTED NOT DETECTED Final   Enterobacteriaceae species NOT DETECTED NOT DETECTED Final   Enterobacter cloacae complex NOT DETECTED NOT DETECTED Final   Escherichia coli NOT DETECTED NOT DETECTED Final   Klebsiella oxytoca NOT DETECTED NOT DETECTED Final   Klebsiella pneumoniae NOT DETECTED NOT DETECTED Final   Proteus species  NOT DETECTED NOT DETECTED Final   Serratia marcescens NOT DETECTED NOT DETECTED Final   Haemophilus influenzae NOT DETECTED NOT DETECTED Final   Neisseria meningitidis NOT DETECTED NOT DETECTED Final   Pseudomonas aeruginosa NOT DETECTED NOT DETECTED Final   Candida albicans NOT DETECTED NOT DETECTED Final   Candida glabrata NOT DETECTED NOT DETECTED Final   Candida krusei NOT DETECTED NOT DETECTED Final   Candida parapsilosis NOT DETECTED NOT DETECTED Final   Candida tropicalis NOT DETECTED NOT DETECTED Final  MRSA PCR Screening     Status: None   Collection Time: 02/27/17 12:13 PM  Result Value Ref Range Status   MRSA by PCR NEGATIVE NEGATIVE Final    Comment:        The GeneXpert MRSA Assay (FDA approved for  NASAL specimens only), is one component of a comprehensive MRSA colonization surveillance program. It is not intended to diagnose MRSA infection nor to guide or monitor treatment for MRSA infections.     Coagulation Studies: No results for input(s): LABPROT, INR in the last 72 hours.  Urinalysis: No results for input(s): COLORURINE, LABSPEC, PHURINE, GLUCOSEU, HGBUR, BILIRUBINUR, KETONESUR, PROTEINUR, UROBILINOGEN, NITRITE, LEUKOCYTESUR in the last 72 hours.  Invalid input(s): APPERANCEUR    Imaging: No results found.   Medications:    . ALPRAZolam  0.5 mg Oral QHS  . atorvastatin  20 mg Oral QPM  . azithromycin  500 mg Oral Daily  . feeding supplement (ENSURE ENLIVE)  237 mL Oral TID WC  . fenofibrate  54 mg Oral Daily  . ferrous sulfate  325 mg Oral BID WC  . insulin aspart  0-5 Units Subcutaneous QHS  . insulin aspart  0-9 Units Subcutaneous TID WC  . metoprolol succinate  12.5 mg Oral Daily  . pantoprazole  40 mg Oral QPM  . sertraline  50 mg Oral Daily  . sodium chloride flush  3 mL Intravenous Q12H  . torsemide  40 mg Oral Daily   acetaminophen **OR** acetaminophen, cyclobenzaprine, fluticasone furoate-vilanterol, levalbuterol, nitroGLYCERIN,  ondansetron **OR** ondansetron (ZOFRAN) IV, polyethylene glycol, tiotropium, traMADol  Assessment/ Plan:  [name] is a 83 y.o.  female [name] is a 83 y.o. white female with diabetes mellitus type II, coronary artery disease, hypertension, hyperlipidemia, generalized anxiety disorder, gout, anemia, history of lung cancer, COPD, atrial fibrillation, biventricular ICD, fatty liver disease, GERD, osteoporosis   1. Chronic kidney disease stage IV with hyperkalemia: Baseline creatinine of 2.06, GFR of 22. Creatinine much better than baseline on this admission. No signs of acute renal failure.   2. Hypertension with peripheral edema. History of pericardial effusion, pleural effusion and diastolic congestive heart failure - Continue furosemide   - Appreciate cardiology input.  - metoprolol  3. Anemia of chronic kidney disease with recent acute blood loss: status post transfusion PRBC 7/4 - iron supplements   LOS: [place], Lavette Yankovich 7/6/20181:42 PM

## 2017-03-02 NOTE — Progress Notes (Signed)
Follow up visit to new referral for [facility] services at home. Patient seen lying in bed, alert and interactive. Plan is for discharge home today via [facility]. Will fax discharge summary when posted. No DME needed prior to discharge. Patient has rollator walker, front wheeled walker, Oxygen, BSC and Nebulizer machine in place in her home. Thank you. Flo Shanks RN, BSN, [facility], [facility] [phone] c

## 2017-03-02 NOTE — Progress Notes (Signed)
Staple removed from left hip incision per Dr Serita Grit order.  Site without redness or drainage.

## 2017-03-04 LAB — CULTURE, BLOOD (ROUTINE X 2)
Culture: NO GROWTH
Special Requests: ADEQUATE

## 2017-03-05 ENCOUNTER — Ambulatory Visit: Payer: Medicare Other

## 2017-03-05 ENCOUNTER — Other Ambulatory Visit: Payer: Medicare Other

## 2017-03-05 NOTE — Discharge Summary (Signed)
[facility] NAME: Regina Burke    MR#:  [id number]  DATE OF BIRTH:  08-06-1933  DATE OF ADMISSION:  02/27/2017 ADMITTING PHYSICIAN: Hillary Bow, MD  DATE OF DISCHARGE: 03/02/17 PRIMARY CARE PHYSICIAN: Tracie Harrier, MD    ADMISSION DIAGNOSIS:  Shortness of breath [R06.02] Acute on chronic heart failure, unspecified heart failure type ([place]) [I50.9] Pneumonia of left lower lobe due to infectious organism ([place]) [J18.1]  DISCHARGE DIAGNOSIS:  Left lower lobe pneumonia Acute on chronic hypoxic respiratory failure secondary to acute on chronic diastolic congestive heart failure Acute on chronic anemia status post 1 unit of blood transfusion Diabetes  SECONDARY DIAGNOSIS:   Past Medical History:  Diagnosis Date  . Anemia   . Arthritis   . Asthma   . Cardiac resynchronization therapy pacemaker (CRT-P) in place    a. 03/31/16:  Medtronic Percepta Quad CRT-P MRI SureScan (serial Number [id number]) device.  . Chronic diastolic CHF (congestive heart failure) ([place])    a. 10/2015 Echo: EF 55-65%, Gr1 DD, mild MR, mildly dil LA, nl RV fxn, nl PASP;  b. 03/2016 Echo: EF 30-35%, mod MR, mod dil LA; c. 12/2016 Echo: EF 60-65%, no rwma, mild AS, mildly dil LA, PASP 79mmHg.  . CKD (chronic kidney disease), stage IV ([place])   . COPD (chronic obstructive pulmonary disease) ([place])   . Coronary artery disease    a. 11/2014 NSTEMI/PCI: LM nl, LAD 48m, D1 30, LCX mild dzs, OM1 20p, OM2 49m, OM3 90p (2.25x8 Promus Premier DES), RCA nl.   . Cough    CHRONIC AT NIGHT  . Cushing's disease ([place])   . Depression   . Diverticulitis   . Edema    FEET/LEGS  . GERD (gastroesophageal reflux disease)   . Gout   . History of hiatal hernia   . History of pneumonia   . HLD (hyperlipidemia)   . HOH (hard of hearing)   . Hypertensive heart disease   . Lung cancer ([place]) dx'd 2014   S/P radiation 2015  . Migraine   . Mixed Ischemic and Nonischemic  Cardiomyopathy ([place])    a. 03/2016 Echo: EF 30-35%;  b. 12/2016 Echo: EF 60-65%.  . Moderate mitral regurgitation   . Multiple allergies   . Myocardial infarction ([place])   . Oxygen deficiency    2LITERS  . Persistent atrial fibrillation ([place])    a. CHADS2VASc ==> 7 (CHF, HTN, age x 2, DM, vascular disease, and gender)--was on renal dosed eliquis but this was d/c'd in 12/2016 in setting of dark stools/anemia;  c. 03/2016 s/p AVN and MDT BiV ICD placement.  . Presence of permanent cardiac pacemaker   . S/P AV nodal ablation    a. on 03/31/16 for persistant afib with CRT-P placement  . Sleep apnea   . Type II diabetes mellitus [facility])     HOSPITAL COURSE:   JoanBroadwayis a 83 y.o.femalewith a known history of D CHF, COPD, DM, HTN recent hip fracture, Afib, not on Eliquis due to GI bleed here with SOB and worsening LE eema. Has chronic orthopnea. Here CXR shows LLL PNA and pulm edema  * LLL Pneumonia with acute on chronic hypoxic respiratory failure -due to multiple allergies she is on meropenem and zthromax---changed to oral Zithromax - MRSA PCR negative -BC neg so far  * Acute on chronic diastolic chf with increasing Left pleural effusion - IV Lasix--- changed to oral diuretics, Beta blockers - Input and Output -  Counseled to limit fluids and Salt - Monitor Bun/Cr and Potassium -echo recently done shows EF 60-65% -Cardiology consult appreciated---spoke with Dr Rockey Situ   *Acute on chronic anemia. Patient has anemia of chronic disease. -EGD done early part of June 2018 negative other than mild nonerosive gastropathy -Patient recently got one unit of blood transfusion last admission she was discharged with hemoglobin of 8.9 -Hemoglobin today 7.3. Given her symptoms of acute and chronic congestive heart failure we'll go ahead and transfuse 1 unit of blood transfusion today. Transfuse thereafter as needed. - 8.9 (last week)---7.3 (admission)---1 unit BT ---8.4  * DM2 Home meds and  SSI  * CKD3 Stable Monitor while being diuresed  * HTN Continue home meds  * COPD No wheezing  Pt does not want to go to rehab. Home health PT and RN  DC patient home  CONSULTS OBTAINED:  Treatment Team:  Lavonia Dana, MD Minna Merritts, MD  DRUG ALLERGIES:   Allergies  Allergen Reactions  . Ciprofloxacin Shortness Of Breath, Itching and Rash  . Doxycycline Shortness Of Breath, Itching and Rash  . Penicillins Shortness Of Breath, Itching, Rash and Other (See Comments)    Has patient had a PCN reaction causing immediate rash, facial/tongue/throat swelling, SOB or lightheadedness with hypotension: Yes Has patient had a PCN reaction causing severe rash involving mucus membranes or skin necrosis: No Has patient had a PCN reaction that required hospitalization No Has patient had a PCN reaction occurring within the last 10 years: No If all of the above answers are "NO", then may proceed with Cephalosporin use.  . Sulfa Antibiotics Shortness Of Breath, Itching and Rash  . Latex Itching  . Morphine And Related Itching  . Albuterol Sulfate   . Macrobid [Nitrofurantoin Macrocrystal] Itching  . Cefuroxime Rash    Blisters in mouth    DISCHARGE MEDICATIONS:   Discharge Medication List as of 03/02/2017  1:00 PM    START taking these medications   Details  azithromycin (ZITHROMAX) 500 MG tablet Take 1 tablet (500 mg total) by mouth daily., Starting Sat 03/03/2017, Normal    feeding supplement, ENSURE ENLIVE, (ENSURE ENLIVE) LIQD Take 237 mLs by mouth 3 (three) times daily with meals., Starting Fri 03/02/2017, Normal    levalbuterol (XOPENEX) 1.25 MG/0.5ML nebulizer solution Take 1.25 mg by nebulization every 6 (six) hours as needed for wheezing or shortness of breath., Starting Fri 03/02/2017, Normal    torsemide (DEMADEX) 20 MG tablet Take 2 tablets (40 mg total) by mouth daily., Starting Fri 03/02/2017, Normal      CONTINUE these medications which have NOT CHANGED    Details  ALPRAZolam (XANAX) 0.5 MG tablet Take 0.5 mg by mouth at bedtime., Starting Wed 12/27/2016, Historical Med    atorvastatin (LIPITOR) 20 MG tablet Take 1 tablet (20 mg total) by mouth every evening., Starting Fri 12/03/2015, Normal    cholecalciferol (VITAMIN D) 400 UNITS TABS tablet Take 400 Units by mouth daily. , Historical Med    cyanocobalamin 500 MCG tablet Take 500 mcg by mouth daily. , Historical Med    fenofibrate (TRICOR) 48 MG tablet Take 48 mg by mouth every evening. , Historical Med    ferrous sulfate 325 (65 FE) MG EC tablet Take 325 mg by mouth daily with breakfast. , Historical Med    fluticasone furoate-vilanterol (BREO ELLIPTA) 100-25 MCG/INH AEPB Inhale 1 puff into the lungs daily as needed. , Historical Med    metoprolol succinate (TOPROL XL) 25 MG 24 hr tablet Take  0.5 tablets (12.5 mg total) by mouth daily., Starting Tue 12/19/2016, Historical Med    Multiple Vitamins-Minerals (PRESERVISION AREDS 2) CAPS Take 1 capsule by mouth daily., Historical Med    nitroGLYCERIN (NITROSTAT) 0.4 MG SL tablet Place 1 tablet (0.4 mg total) under the tongue every 5 (five) minutes as needed for chest pain., Starting Thu 12/24/2014, Normal    pantoprazole (PROTONIX) 40 MG tablet Take 40 mg by mouth every evening. , Historical Med    sertraline (ZOLOFT) 50 MG tablet Take 1 tablet (50 mg total) by mouth daily., Starting Fri 03/24/2016, Normal    tiotropium (SPIRIVA) 18 MCG inhalation capsule Place 18 mcg into inhaler and inhale daily as needed. , Historical Med    traMADol (ULTRAM) 50 MG tablet Take 50 mg by mouth every 6 (six) hours as needed., Historical Med      STOP taking these medications     furosemide (LASIX) 40 MG tablet         If you experience worsening of your admission symptoms, develop shortness of breath, life threatening emergency, suicidal or homicidal thoughts you must seek medical attention immediately by calling 911 or calling your MD immediately  if  symptoms less severe.  You Must read complete instructions/literature along with all the possible adverse reactions/side effects for all the Medicines you take and that have been prescribed to you. Take any new Medicines after you have completely understood and accept all the possible adverse reactions/side effects.   Please note  You were cared for by a hospitalist during your hospital stay. If you have any questions about your discharge medications or the care you received while you were in the hospital after you are discharged, you can call the unit and asked to speak with the hospitalist on call if the hospitalist that took care of you is not available. Once you are discharged, your primary care physician will handle any further medical issues. Please note that NO REFILLS for any discharge medications will be authorized once you are discharged, as it is imperative that you return to your primary care physician (or establish a relationship with a primary care physician if you do not have one) for your aftercare needs so that they can reassess your need for medications and monitor your lab values. Today   SUBJECTIVE   Overall doing well  VITAL SIGNS:  Blood pressure (!) 127/51, pulse 69, temperature 97.9 F (36.6 C), temperature source Oral, resp. rate 16, height 5\' 4"  (1.626 m), weight 79.8 kg (176 lb), SpO2 100 %.  I/O:  No intake or output data in the 24 hours ending 03/05/17 1240  PHYSICAL EXAMINATION:  GENERAL:  83 y.o.-year-old patient lying in the bed with no acute distress.  EYES: Pupils equal, round, reactive to light and accommodation. No scleral icterus. Extraocular muscles intact.  HEENT: Head atraumatic, normocephalic. Oropharynx and nasopharynx clear.  NECK:  Supple, no jugular venous distention. No thyroid enlargement, no tenderness.  LUNGS: Normal breath sounds bilaterally, no wheezing, rales,rhonchi or crepitation. No use of accessory muscles of respiration.   CARDIOVASCULAR: S1, S2 normal. No murmurs, rubs, or gallops.  ABDOMEN: Soft, non-tender, non-distended. Bowel sounds present. No organomegaly or mass.  EXTREMITIES: No pedal edema, cyanosis, or clubbing.  NEUROLOGIC: Cranial nerves II through XII are intact. Muscle strength 5/5 in all extremities. Sensation intact. Gait not checked.  PSYCHIATRIC: The patient is alert and oriented x 3.  SKIN: No obvious rash, lesion, or ulcer.   DATA REVIEW:   CBC  Recent Labs Lab 02/28/17 0543 03/01/17 0626  WBC 11.0  --   HGB 7.3* 8.4*  HCT 22.7*  --   PLT 212  --     Chemistries   Recent Labs Lab 02/28/17 0543  NA 137  K 3.9  CL 94*  CO2 31  GLUCOSE 104*  BUN 43*  CREATININE 1.54*  CALCIUM 8.2*    Microbiology Results   Recent Results (from the past 240 hour(s))  Urine culture     Status: Abnormal   Collection Time: 02/27/17  7:22 AM  Result Value Ref Range Status   Specimen Description URINE, RANDOM  Final   Special Requests NONE  Final   Culture >=100,000 COLONIES/mL ESCHERICHIA COLI (A)  Final   Report Status 03/02/2017 FINAL  Final   Organism ID, Bacteria ESCHERICHIA COLI (A)  Final      Susceptibility   Escherichia coli - MIC*    AMPICILLIN 16 INTERMEDIATE Intermediate     CEFAZOLIN <=4 SENSITIVE Sensitive     CEFTRIAXONE <=1 SENSITIVE Sensitive     CIPROFLOXACIN <=0.25 SENSITIVE Sensitive     GENTAMICIN <=1 SENSITIVE Sensitive     IMIPENEM 0.5 SENSITIVE Sensitive     NITROFURANTOIN <=16 SENSITIVE Sensitive     TRIMETH/SULFA <=20 SENSITIVE Sensitive     AMPICILLIN/SULBACTAM 4 SENSITIVE Sensitive     PIP/TAZO <=4 SENSITIVE Sensitive     Extended ESBL NEGATIVE Sensitive     * >=100,000 COLONIES/mL ESCHERICHIA COLI  Blood Culture (routine x 2)     Status: Abnormal   Collection Time: 02/27/17  8:58 AM  Result Value Ref Range Status   Specimen Description BLOOD LEFT EXTERNAL JUGULAR  Final   Special Requests   Final    BOTTLES DRAWN AEROBIC AND ANAEROBIC Blood  Culture adequate volume   Culture  Setup Time   Final    GRAM POSITIVE COCCI ANAEROBIC BOTTLE ONLY CRITICAL RESULT CALLED TO, READ BACK BY AND VERIFIED WITH: SCOTT CHRISTY ON 02/28/17 AT 1031 QSD    Culture (A)  Final    STAPHYLOCOCCUS SPECIES (COAGULASE NEGATIVE) THE SIGNIFICANCE OF ISOLATING THIS ORGANISM FROM A SINGLE SET OF BLOOD CULTURES WHEN MULTIPLE SETS ARE DRAWN IS UNCERTAIN. PLEASE NOTIFY THE MICROBIOLOGY DEPARTMENT WITHIN ONE WEEK IF SPECIATION AND SENSITIVITIES ARE REQUIRED. Performed at [facility], [place] [address], [place], [place] [address]    Report Status 03/02/2017 FINAL  Final  Blood Culture (routine x 2)     Status: None   Collection Time: 02/27/17  8:58 AM  Result Value Ref Range Status   Specimen Description BLOOD BLOOD LEFT FOREARM  Final   Special Requests   Final    BOTTLES DRAWN AEROBIC AND ANAEROBIC Blood Culture adequate volume   Culture NO GROWTH 5 DAYS  Final   Report Status 03/04/2017 FINAL  Final  Blood Culture ID Panel (Reflexed)     Status: Abnormal   Collection Time: 02/27/17  8:58 AM  Result Value Ref Range Status   Enterococcus species NOT DETECTED NOT DETECTED Final   Listeria monocytogenes NOT DETECTED NOT DETECTED Final   Staphylococcus species DETECTED (A) NOT DETECTED Final    Comment: Methicillin (oxacillin) resistant coagulase negative staphylococcus. Possible blood culture contaminant (unless isolated from more than one blood culture draw or clinical case suggests pathogenicity). No antibiotic treatment is indicated for blood  culture contaminants. CRITICAL RESULT CALLED TO, READ BACK BY AND VERIFIED WITH: SCOTT CHRISTY ON 02/28/17 AT 1031 QSD    Staphylococcus aureus NOT DETECTED  NOT DETECTED Final   Methicillin resistance DETECTED (A) NOT DETECTED Final    Comment: CRITICAL RESULT CALLED TO, READ BACK BY AND VERIFIED WITH: SCOTT CHRISTY ON 02/28/17 AT 1031 QSD    Streptococcus species NOT DETECTED NOT DETECTED Final   Streptococcus  agalactiae NOT DETECTED NOT DETECTED Final   Streptococcus pneumoniae NOT DETECTED NOT DETECTED Final   Streptococcus pyogenes NOT DETECTED NOT DETECTED Final   Acinetobacter baumannii NOT DETECTED NOT DETECTED Final   Enterobacteriaceae species NOT DETECTED NOT DETECTED Final   Enterobacter cloacae complex NOT DETECTED NOT DETECTED Final   Escherichia coli NOT DETECTED NOT DETECTED Final   Klebsiella oxytoca NOT DETECTED NOT DETECTED Final   Klebsiella pneumoniae NOT DETECTED NOT DETECTED Final   Proteus species NOT DETECTED NOT DETECTED Final   Serratia marcescens NOT DETECTED NOT DETECTED Final   Haemophilus influenzae NOT DETECTED NOT DETECTED Final   Neisseria meningitidis NOT DETECTED NOT DETECTED Final   Pseudomonas aeruginosa NOT DETECTED NOT DETECTED Final   Candida albicans NOT DETECTED NOT DETECTED Final   Candida glabrata NOT DETECTED NOT DETECTED Final   Candida krusei NOT DETECTED NOT DETECTED Final   Candida parapsilosis NOT DETECTED NOT DETECTED Final   Candida tropicalis NOT DETECTED NOT DETECTED Final  MRSA PCR Screening     Status: None   Collection Time: 02/27/17 12:13 PM  Result Value Ref Range Status   MRSA by PCR NEGATIVE NEGATIVE Final    Comment:        The GeneXpert MRSA Assay (FDA approved for NASAL specimens only), is one component of a comprehensive MRSA colonization surveillance program. It is not intended to diagnose MRSA infection nor to guide or monitor treatment for MRSA infections.     RADIOLOGY:  No results found.   Management plans discussed with the patient, family and they are in agreement.  CODE STATUS:  Code Status History    Date Active Date Inactive Code Status Order ID Comments User Context   02/27/2017  9:03 AM 03/02/2017  6:10 PM DNR [id number]  Hillary Bow, MD ED   01/31/2017  3:23 PM 02/05/2017  9:39 PM Full Code [id number]  Nicholes Mango, MD Inpatient   01/23/2017  4:48 PM 01/30/2017  6:31 PM DNR [id number]  Fritzi Mandes, MD ED    01/12/2017 11:14 AM 01/13/2017  6:36 PM Full Code [id number]  Epifanio Lesches, MD ED   03/30/2016 11:52 AM 04/03/2016  4:11 PM DNR [id number]  Erma Heritage, [place] Inpatient   03/16/2016  4:10 PM 03/24/2016  7:06 PM DNR [id number]  Awilda Bill, NP ED   03/08/2016  3:43 PM 03/14/2016  7:40 PM Partial Code [id number]  Knox Royalty, NP Inpatient   03/04/2016 10:28 PM 03/08/2016  3:43 PM Full Code [id number]  Holley Raring, NP ED   01/19/2016  7:56 PM 01/27/2016  6:01 PM Partial Code [id number]  Vaughan Basta, MD ED   11/26/2015  4:02 AM 11/26/2015  8:59 PM Full Code [id number]  Silver Huguenin, RN Inpatient   06/14/2015  6:57 PM 06/15/2015  8:54 PM DNR [id number]  Colleen Can, MD Inpatient   06/07/2015 10:20 PM 06/14/2015  6:56 PM Full Code [id number]  Hower, Aaron Mose, MD ED   05/15/2015  3:27 PM 05/24/2015  5:04 PM Full Code [id number]  Gladstone Lighter, MD ED   05/08/2015 11:23 AM 05/11/2015  3:13 PM Full Code [id number]  Dustin Flock, MD ED   04/15/2015 12:01 AM 04/15/2015  7:03 PM Full Code  [id number]  Lance Coon, MD Inpatient   12/23/2014  1:09 PM 12/24/2014  5:17 PM Full Code [id number]  Burnell Blanks, MD Inpatient   12/23/2014  4:22 AM 12/23/2014  1:09 PM Full Code [id number]  Alwyn Pea, MD ED    Questions for Most Recent Historical Code Status (Order [id number])    Question Answer Comment   In the event of cardiac or respiratory ARREST Do not call a "code blue"    In the event of cardiac or respiratory ARREST Do not perform Intubation, CPR, defibrillation or ACLS    In the event of cardiac or respiratory ARREST Use medication by any route, position, wound care, and other measures to relive pain and suffering. May use oxygen, suction and manual treatment of airway obstruction as needed for comfort.         Advance Directive Documentation     Most Recent Value  Type of Advance Directive  Healthcare Power of Attorney, Living will  Pre-existing out of facility DNR order  (yellow form or pink MOST form)  -  "MOST" Form in Place?  -      TOTAL TIME TAKING CARE OF THIS PATIENT: 40 minutes.    Rebeka Kimble M.D on 03/05/2017 at 12:40 PM  Between 7am to 6pm - Pager - [phone] After 6pm go to [email or web address] - password [email or web address]  Office  [phone]  CC: Primary care physician; Tracie Harrier, MD

## 2017-03-15 ENCOUNTER — Ambulatory Visit: Payer: Medicare Other | Admitting: Family

## 2017-03-19 ENCOUNTER — Ambulatory Visit: Payer: Medicare Other

## 2017-03-19 ENCOUNTER — Other Ambulatory Visit: Payer: Medicare Other

## 2017-03-30 ENCOUNTER — Ambulatory Visit: Admission: RE | Admit: 2017-03-30 | Payer: Medicare Other | Source: Ambulatory Visit

## 2017-04-02 ENCOUNTER — Ambulatory Visit: Payer: Medicare Other

## 2017-04-02 ENCOUNTER — Other Ambulatory Visit: Payer: Medicare Other

## 2017-04-02 ENCOUNTER — Ambulatory Visit: Payer: Medicare Other | Admitting: Oncology

## 2017-04-04 ENCOUNTER — Inpatient Hospital Stay: Payer: Medicare Other | Admitting: Oncology

## 2017-04-04 ENCOUNTER — Inpatient Hospital Stay: Payer: Medicare Other

## 2017-04-24 ENCOUNTER — Telehealth: Payer: Self-pay | Admitting: Internal Medicine

## 2017-04-24 ENCOUNTER — Ambulatory Visit (INDEPENDENT_AMBULATORY_CARE_PROVIDER_SITE_OTHER): Admitting: *Deleted

## 2017-04-24 DIAGNOSIS — I255 Ischemic cardiomyopathy: Secondary | ICD-10-CM | POA: Diagnosis not present

## 2017-04-24 NOTE — Telephone Encounter (Signed)
Patient having pain at pacemaker site and when she takes a deep breath it hurts across her ribs.

## 2017-04-24 NOTE — Telephone Encounter (Signed)
Mr. Estill Bakes (son) reports that the PPM pocket is without redness, swelling or drainage and denies recent fever/chills. He thinks that she is just sore across her chest from activity. He questions sending a remote transmission- I advised that they go ahead since they are overdue. He will send a transmission now and comply with f/u with Dr. Caryl Comes in [facility] 05/22/17.

## 2017-04-24 NOTE — Telephone Encounter (Signed)
Transmission received- normal device function. Thoracic impedance recently indicated fluid overload but it has returned to baseline.   Routed back to [facility] Triage.

## 2017-05-03 NOTE — Progress Notes (Signed)
Remote pacemaker transmission.   

## 2017-05-04 ENCOUNTER — Encounter: Payer: Self-pay | Admitting: Cardiology

## 2017-05-04 NOTE — Progress Notes (Signed)
[place]  [phone] Fax:(336) [phone]  ID: Regina Burke OB: 05-27-1933  MR#: [id number]  KXF#:[id number]  Patient Care Team: Tracie Harrier, MD as PCP - General (Internal Medicine) Minna Merritts, MD as Consulting Physician (Cardiology) Alisa Graff, FNP as Nurse Practitioner (Family Medicine) Laverle Hobby, MD as Consulting Physician (Pulmonary Disease) Lavonia Dana, MD as Consulting Physician (Internal Medicine)  CHIEF COMPLAINT: Anemia secondary to chronic renal failure, clinical stage IA adenocarcinoma of right upper lobe lung.  INTERVAL HISTORY: Patient returns to clinic today for repeat laboratory work and further evaluation. She continues to have chronic weakness and fatigue which is unchanged. She has multiple medical complaints that are chronic and unchanged. She was recently enrolled in hospice after her most recent hospital admission. She has no new neurologic complaints. She denies any recent fevers. She denies any chest pain, but has chronic shortness of breath and requires oxygen 24 hours per day. She has no nausea, vomiting, constipation, or diarrhea. She has no urinary complaints. Patient offers no further specific complaints today.  REVIEW OF SYSTEMS:   Review of Systems  Constitutional: Positive for malaise/fatigue. Negative for fever and weight loss.  Respiratory: Positive for shortness of breath. Negative for cough and hemoptysis.   Cardiovascular: Positive for leg swelling. Negative for chest pain.  Gastrointestinal: Negative.  Negative for abdominal pain, blood in stool, diarrhea and melena.  Genitourinary: Negative.   Musculoskeletal: Negative.   Skin: Negative.  Negative for rash.  Neurological: Positive for dizziness and weakness. Negative for focal weakness.  Psychiatric/Behavioral: Negative.  The patient is not nervous/anxious.     As per HPI. Otherwise, a complete review of systems is  negative.  PAST MEDICAL HISTORY: Past Medical History:  Diagnosis Date  . Anemia   . Arthritis   . Asthma   . Cardiac resynchronization therapy pacemaker (CRT-P) in place    a. 03/31/16:  Medtronic Percepta Quad CRT-P MRI SureScan (serial Number [id number]) device.  . Chronic diastolic CHF (congestive heart failure) ([place])    a. 10/2015 Echo: EF 55-65%, Gr1 DD, mild MR, mildly dil LA, nl RV fxn, nl PASP;  b. 03/2016 Echo: EF 30-35%, mod MR, mod dil LA; c. 12/2016 Echo: EF 60-65%, no rwma, mild AS, mildly dil LA, PASP 2mmHg.  . CKD (chronic kidney disease), stage IV ([place])   . COPD (chronic obstructive pulmonary disease) ([place])   . Coronary artery disease    a. 11/2014 NSTEMI/PCI: LM nl, LAD 35m, D1 30, LCX mild dzs, OM1 20p, OM2 65m, OM3 90p (2.25x8 Promus Premier DES), RCA nl.   . Cough    CHRONIC AT NIGHT  . Cushing's disease ([place])   . Depression   . Diverticulitis   . Edema    FEET/LEGS  . GERD (gastroesophageal reflux disease)   . Gout   . History of hiatal hernia   . History of pneumonia   . HLD (hyperlipidemia)   . HOH (hard of hearing)   . Hypertensive heart disease   . Lung cancer ([place]) dx'd 2014   S/P radiation 2015  . Migraine   . Mixed Ischemic and Nonischemic Cardiomyopathy ([place])    a. 03/2016 Echo: EF 30-35%;  b. 12/2016 Echo: EF 60-65%.  . Moderate mitral regurgitation   . Multiple allergies   . Myocardial infarction ([place])   . Oxygen deficiency    2LITERS  . Persistent atrial fibrillation ([place])    a. CHADS2VASc ==> 7 (CHF, HTN, age x 2, DM, vascular disease,  and gender)--was on renal dosed eliquis but this was d/c'd in 12/2016 in setting of dark stools/anemia;  c. 03/2016 s/p AVN and MDT BiV ICD placement.  . Presence of permanent cardiac pacemaker   . S/P AV nodal ablation    a. on 03/31/16 for persistant afib with CRT-P placement  . Sleep apnea   . Type II diabetes mellitus ([place])     PAST SURGICAL HISTORY: Past Surgical History:  Procedure Laterality Date  .  ABDOMINAL HYSTERECTOMY    . ABLATION     July 2017  . ADRENALECTOMY Left 1980's   "Cushings"  . APPENDECTOMY    . BREAST CYST EXCISION Left   . CATARACT EXTRACTION W/PHACO Right 12/28/2015   Procedure: CATARACT EXTRACTION PHACO AND INTRAOCULAR LENS PLACEMENT (IOC);  Surgeon: Birder Robson, MD;  Location: [facility] ORS;  Service: Ophthalmology;  Laterality: Right;  [place] 48.4   . CATARACT EXTRACTION W/PHACO Left 11/14/2016   Procedure: CATARACT EXTRACTION PHACO AND INTRAOCULAR LENS PLACEMENT (IOC);  Surgeon: Birder Robson, MD;  Location: [facility] ORS;  Service: Ophthalmology;  Laterality: Left;  [place] 59.8 AP% 18.1 CDE 10.78 Fluid pack lot # [id number]  . CHOLECYSTECTOMY    . CORONARY ANGIOPLASTY WITH STENT PLACEMENT  12/23/2014  . ELECTROPHYSIOLOGIC STUDY N/A 03/08/2016   Procedure: CARDIOVERSION;  Surgeon: Wende Bushy, MD;  Location: [facility] ORS;  Service: Cardiovascular;  Laterality: N/A;  . ELECTROPHYSIOLOGIC STUDY N/A 03/07/2016   Procedure: Cardioversion;  Surgeon: Wende Bushy, MD;  Location: [facility] ORS;  Service: Cardiovascular;  Laterality: N/A;  . ELECTROPHYSIOLOGIC STUDY N/A 03/31/2016   Procedure: AV Node Ablation;  Surgeon: Will Meredith Leeds, MD;  Location: [place] [facility];  Service: Cardiovascular;  Laterality: N/A;  . EP IMPLANTABLE DEVICE N/A 03/31/2016   Procedure: BiV Pacemaker Insertion CRT-P;  Surgeon: Will Meredith Leeds, MD;  Location: [place] [facility];  Service: Cardiovascular;  Laterality: N/A;  . ESOPHAGOGASTRODUODENOSCOPY (EGD) WITH PROPOFOL N/A 01/26/2017   Procedure: ESOPHAGOGASTRODUODENOSCOPY (EGD) WITH PROPOFOL;  Surgeon: [name], MD;  Location: [facility] ENDOS[facility];  Service: Endoscopy;  Laterality: N/A;  . FRACTURE SURGERY    . HIP PINNING,CANNULATED Left 02/01/2017   Procedure: CANNULATED HIP PINNING;  Surgeon: Lovell Sheehan, MD;  Location: [facility] ORS;  Service: Orthopedics;  Laterality: Left;  . INSERT / REPLACE / REMOVE PACEMAKER  02/2016  . LEFT HEART CATHETERIZATION WITH  CORONARY ANGIOGRAM N/A 12/23/2014   Procedure: LEFT HEART CATHETERIZATION WITH CORONARY ANGIOGRAM;  Surgeon: Burnell Blanks, MD;  Location: [facility] CATH LAB;  Service: Cardiovascular;  Laterality: N/A;  . PERCUTANEOUS CORONARY STENT INTERVENTION (PCI-S)  12/23/2014   Procedure: PERCUTANEOUS CORONARY STENT INTERVENTION (PCI-S);  Surgeon: Burnell Blanks, MD;  Location[facility];  Service: Cardiovascular;;  Promus 2.25x8  . TONSILLECTOMY    . TRANSTHORACIC ECHOCARDIOGRAM  11/26/2015   Technically difficult study. EF 55-60%. Normal wall motion. GR 1 DD.  . TUBAL LIGATION    . WRIST FRACTURE SURGERY Bilateral ~ 2000    FAMILY HISTORY: Family History  Problem Relation Age of Onset  . Heart disease Mother   . Diabetes Mother   . Osteoarthritis Mother   . Hypertension Mother   . Heart disease Father   . Hypertension Father   . COPD Brother     ADVANCED DIRECTIVES (Y/N):  N  HEALTH MAINTENANCE: Social History  Substance Use Topics  . Smoking status: Former Smoker    Packs/day: 1.00    Years: 45.00    Types: Cigarettes    Quit date: 04/25/1994  .  Smokeless tobacco: Never Used  . Alcohol use No     Comment: 12/23/2014 "might have a couple mixed drinks/year"     Colonoscopy:  PAP:  Bone density:  Lipid panel:  Allergies  Allergen Reactions  . Ciprofloxacin Shortness Of Breath, Itching and Rash  . Doxycycline Shortness Of Breath, Itching and Rash  . Penicillins Shortness Of Breath, Itching, Rash and Other (See Comments)    Has patient had a PCN reaction causing immediate rash, facial/tongue/throat swelling, SOB or lightheadedness with hypotension: Yes Has patient had a PCN reaction causing severe rash involving mucus membranes or skin necrosis: No Has patient had a PCN reaction that required hospitalization No Has patient had a PCN reaction occurring within the last 10 years: No If all of the above answers are "NO", then may proceed with Cephalosporin use.  . Sulfa  Antibiotics Shortness Of Breath, Itching and Rash  . Latex Itching  . Morphine And Related Itching  . Albuterol Sulfate   . Macrobid [Nitrofurantoin Macrocrystal] Itching  . Cefuroxime Rash    Blisters in mouth    Current Outpatient Prescriptions  Medication Sig Dispense Refill  . ALPRAZolam (XANAX) 0.5 MG tablet Take 0.5 mg by mouth at bedtime.    Marland Kitchen atorvastatin (LIPITOR) 20 MG tablet Take 1 tablet (20 mg total) by mouth every evening. 90 tablet 3  . busPIRone (BUSPAR) 7.5 MG tablet Take by mouth.    . feeding supplement, ENSURE ENLIVE, (ENSURE ENLIVE) LIQD Take 237 mLs by mouth 3 (three) times daily with meals. 237 mL 12  . fenofibrate (TRICOR) 48 MG tablet Take 48 mg by mouth every evening.     . fluticasone furoate-vilanterol (BREO ELLIPTA) 100-25 MCG/INH AEPB Inhale 1 puff into the lungs daily as needed.     . levalbuterol (XOPENEX) 1.25 MG/0.5ML nebulizer solution Take 1.25 mg by nebulization every 6 (six) hours as needed for wheezing or shortness of breath. 1 each 12  . metoprolol succinate (TOPROL XL) 25 MG 24 hr tablet Take 0.5 tablets (12.5 mg total) by mouth daily.    . Multiple Vitamins-Minerals (PRESERVISION AREDS 2) CAPS Take 1 capsule by mouth daily.    . nitroGLYCERIN (NITROSTAT) 0.4 MG SL tablet Place 1 tablet (0.4 mg total) under the tongue every 5 (five) minutes as needed for chest pain. 25 tablet 3  . pantoprazole (PROTONIX) 40 MG tablet Take 40 mg by mouth every evening.     . sertraline (ZOLOFT) 50 MG tablet Take 1 tablet (50 mg total) by mouth daily. 30 tablet 2  . tiotropium (SPIRIVA) 18 MCG inhalation capsule Place 18 mcg into inhaler and inhale daily as needed.     . torsemide (DEMADEX) 20 MG tablet Take 2 tablets (40 mg total) by mouth daily. 30 tablet 0  . traMADol (ULTRAM) 50 MG tablet Take 50 mg by mouth every 6 (six) hours as needed.    Marland Kitchen azithromycin (ZITHROMAX) 500 MG tablet Take 1 tablet (500 mg total) by mouth daily. (Patient not taking: Reported on  05/07/2017) 3 tablet 0  . cholecalciferol (VITAMIN D) 400 UNITS TABS tablet Take 400 Units by mouth daily.     . cyanocobalamin 500 MCG tablet Take 500 mcg by mouth daily.     . ferrous sulfate 325 (65 FE) MG EC tablet Take 325 mg by mouth daily with breakfast.      No current facility-administered medications for this visit.     OBJECTIVE: Vitals:   05/07/17 1113  BP: (!) 159/78  Pulse:  69  Resp: 18  Temp: 97.8 F (36.6 C)  SpO2: 99%     There is no height or weight on file to calculate BMI.    ECOG FS:2 - Symptomatic, <50% confined to bed  General: Well-developed, well-nourished, no acute distress. Eyes: Pink conjunctiva, anicteric sclera. Lungs: Diminished breath sounds bilaterally. Heart: Regular rate and rhythm. No rubs, murmurs, or gallops. Abdomen: Soft, nontender, nondistended. No organomegaly noted, normoactive bowel sounds. Musculoskeletal: No edema, cyanosis, or clubbing. Neuro: Alert, answering all questions appropriately. Cranial nerves grossly intact. Skin: No rashes or petechiae noted. Psych: Normal affect.   LAB RESULTS:  Lab Results  Component Value Date   NA 137 02/28/2017   K 3.9 02/28/2017   CL 94 (L) 02/28/2017   CO2 31 02/28/2017   GLUCOSE 104 (H) 02/28/2017   BUN 43 (H) 02/28/2017   CREATININE 1.54 (H) 02/28/2017   CALCIUM 8.2 (L) 02/28/2017   PROT 6.5 02/02/2017   ALBUMIN 3.0 (L) 02/02/2017   AST 21 02/02/2017   ALT 12 (L) 02/02/2017   ALKPHOS 49 02/02/2017   BILITOT 0.9 02/02/2017   GFRNONAA 30 (L) 02/28/2017   GFRAA 35 (L) 02/28/2017    Lab Results  Component Value Date   WBC 11.0 02/28/2017   NEUTROABS 9.5 (H) 01/31/2017   HGB 8.6 (L) 05/07/2017   HCT 22.7 (L) 02/28/2017   MCV 82.2 02/28/2017   PLT 212 02/28/2017   Lab Results  Component Value Date   IRON 56 12/04/2016   TIBC 444 12/04/2016   IRONPCTSAT 13 12/04/2016   Lab Results  Component Value Date   FERRITIN 71 12/04/2016     STUDIES: No results  found.  ASSESSMENT: Anemia secondary to chronic renal failure, clinical stage IA adenocarcinoma of right upper lobe lung.  PLAN:    1. Anemia secondary to chronic renal failure: Patient's hemoglobin has trended below 10.0, therefore will proceed with 40,000 units of Procrit today. Patient is requesting less frequent follow-up therefore she will return to clinic every 2 months for laboratory work and consideration of Procrit. Return to clinic in 6 months with repeat laboratory work and further evaluation.  2. Clinical stage IA adenocarcinoma of right upper lobe lung: Patient is status post stereotactic radiation therapy. Repeat CT scan on Jan 24, 2017 did not reveal any recurrence or progressive disease. Given patient's multiple comorbidities and declining performance status, no further treatment is recommended. Given the patient is now enrolled in hospice, will not do any further imaging either. Follow-up as above.  3. Chronic renal failure: Since creatinine appears to be at her baseline.  Continue evaluation and treatment per nephrology as needed.   Patient expressed understanding and was in agreement with this plan. She also understands that She can call clinic at any time with any questions, concerns, or complaints.   Cancer Staging Cancer of upper lobe of right lung [facility]) Staging form: Lung, AJCC 7th Edition - Clinical stage from 10/08/2016: Stage IA (T1a, N0, M0) - Signed by Lloyd Huger, MD on 10/08/2016   Lloyd Huger, MD   05/07/2017 12:25 PM

## 2017-05-07 ENCOUNTER — Inpatient Hospital Stay

## 2017-05-07 ENCOUNTER — Inpatient Hospital Stay: Attending: Oncology | Admitting: Oncology

## 2017-05-07 VITALS — BP 159/78 | HR 69 | Temp 97.8°F | Resp 18

## 2017-05-07 DIAGNOSIS — C3411 Malignant neoplasm of upper lobe, right bronchus or lung: Secondary | ICD-10-CM

## 2017-05-07 DIAGNOSIS — E785 Hyperlipidemia, unspecified: Secondary | ICD-10-CM

## 2017-05-07 DIAGNOSIS — N189 Chronic kidney disease, unspecified: Secondary | ICD-10-CM

## 2017-05-07 DIAGNOSIS — Z9889 Other specified postprocedural states: Secondary | ICD-10-CM | POA: Diagnosis not present

## 2017-05-07 DIAGNOSIS — F329 Major depressive disorder, single episode, unspecified: Secondary | ICD-10-CM

## 2017-05-07 DIAGNOSIS — E1122 Type 2 diabetes mellitus with diabetic chronic kidney disease: Secondary | ICD-10-CM

## 2017-05-07 DIAGNOSIS — M129 Arthropathy, unspecified: Secondary | ICD-10-CM

## 2017-05-07 DIAGNOSIS — D631 Anemia in chronic kidney disease: Principal | ICD-10-CM

## 2017-05-07 DIAGNOSIS — N184 Chronic kidney disease, stage 4 (severe): Secondary | ICD-10-CM | POA: Diagnosis not present

## 2017-05-07 DIAGNOSIS — K449 Diaphragmatic hernia without obstruction or gangrene: Secondary | ICD-10-CM | POA: Diagnosis not present

## 2017-05-07 DIAGNOSIS — I2510 Atherosclerotic heart disease of native coronary artery without angina pectoris: Secondary | ICD-10-CM

## 2017-05-07 DIAGNOSIS — I252 Old myocardial infarction: Secondary | ICD-10-CM

## 2017-05-07 DIAGNOSIS — R531 Weakness: Secondary | ICD-10-CM

## 2017-05-07 DIAGNOSIS — R5383 Other fatigue: Secondary | ICD-10-CM | POA: Diagnosis not present

## 2017-05-07 DIAGNOSIS — R0602 Shortness of breath: Secondary | ICD-10-CM

## 2017-05-07 DIAGNOSIS — K219 Gastro-esophageal reflux disease without esophagitis: Secondary | ICD-10-CM | POA: Diagnosis not present

## 2017-05-07 DIAGNOSIS — M109 Gout, unspecified: Secondary | ICD-10-CM

## 2017-05-07 DIAGNOSIS — R42 Dizziness and giddiness: Secondary | ICD-10-CM | POA: Diagnosis not present

## 2017-05-07 DIAGNOSIS — Z79899 Other long term (current) drug therapy: Secondary | ICD-10-CM

## 2017-05-07 DIAGNOSIS — Z87891 Personal history of nicotine dependence: Secondary | ICD-10-CM

## 2017-05-07 DIAGNOSIS — I481 Persistent atrial fibrillation: Secondary | ICD-10-CM | POA: Diagnosis not present

## 2017-05-07 DIAGNOSIS — I5032 Chronic diastolic (congestive) heart failure: Secondary | ICD-10-CM | POA: Diagnosis not present

## 2017-05-07 DIAGNOSIS — J45909 Unspecified asthma, uncomplicated: Secondary | ICD-10-CM

## 2017-05-07 DIAGNOSIS — I129 Hypertensive chronic kidney disease with stage 1 through stage 4 chronic kidney disease, or unspecified chronic kidney disease: Secondary | ICD-10-CM | POA: Diagnosis not present

## 2017-05-07 DIAGNOSIS — J449 Chronic obstructive pulmonary disease, unspecified: Secondary | ICD-10-CM | POA: Diagnosis not present

## 2017-05-07 LAB — HEMOGLOBIN: HEMOGLOBIN: 8.6 g/dL — AB (ref 12.0–16.0)

## 2017-05-07 MED ORDER — EPOETIN ALFA 40000 UNIT/ML IJ SOLN
40000.0000 [IU] | Freq: Once | INTRAMUSCULAR | Status: AC
  Administered 2017-05-07: 40000 [IU] via SUBCUTANEOUS
  Filled 2017-05-07: qty 1

## 2017-05-11 LAB — CUP PACEART REMOTE DEVICE CHECK
Battery Remaining Longevity: 75 mo
Brady Statistic AP VS Percent: 0 %
Brady Statistic AS VS Percent: 0.01 %
Date Time Interrogation Session: 20180828195149
Implantable Lead Implant Date: 20170804
Implantable Lead Location: 753858
Implantable Lead Location: 753860
Implantable Pulse Generator Implant Date: 20170804
Lead Channel Impedance Value: 304 Ohm
Lead Channel Impedance Value: 3344 Ohm
Lead Channel Impedance Value: 3344 Ohm
Lead Channel Impedance Value: 399 Ohm
Lead Channel Impedance Value: 532 Ohm
Lead Channel Impedance Value: 551 Ohm
Lead Channel Impedance Value: 627 Ohm
Lead Channel Impedance Value: 627 Ohm
Lead Channel Impedance Value: 741 Ohm
Lead Channel Impedance Value: 893 Ohm
Lead Channel Pacing Threshold Amplitude: 2.5 V
Lead Channel Pacing Threshold Pulse Width: 0.4 ms
Lead Channel Pacing Threshold Pulse Width: 1 ms
Lead Channel Sensing Intrinsic Amplitude: 18.125 mV
Lead Channel Setting Pacing Amplitude: 2.5 V
Lead Channel Setting Pacing Pulse Width: 1.5 ms
Lead Channel Setting Sensing Sensitivity: 4 mV
MDC IDC LEAD IMPLANT DT: 20170804
MDC IDC MSMT BATTERY VOLTAGE: 3 V
MDC IDC MSMT LEADCHNL LV IMPEDANCE VALUE: 437 Ohm
MDC IDC MSMT LEADCHNL LV IMPEDANCE VALUE: 456 Ohm
MDC IDC MSMT LEADCHNL LV IMPEDANCE VALUE: 893 Ohm
MDC IDC MSMT LEADCHNL RV IMPEDANCE VALUE: 513 Ohm
MDC IDC MSMT LEADCHNL RV PACING THRESHOLD AMPLITUDE: 0.5 V
MDC IDC SET LEADCHNL LV PACING AMPLITUDE: 2.5 V
MDC IDC SET LEADCHNL RV PACING PULSEWIDTH: 0.4 ms
MDC IDC STAT BRADY AP VP PERCENT: 0 %
MDC IDC STAT BRADY AS VP PERCENT: 99.99 %
MDC IDC STAT BRADY RA PERCENT PACED: 0 %
MDC IDC STAT BRADY RV PERCENT PACED: 99.99 %

## 2017-05-22 ENCOUNTER — Ambulatory Visit (INDEPENDENT_AMBULATORY_CARE_PROVIDER_SITE_OTHER): Admitting: Internal Medicine

## 2017-05-22 ENCOUNTER — Encounter: Payer: Self-pay | Admitting: Internal Medicine

## 2017-05-22 VITALS — BP 120/70 | HR 71 | Ht 64.0 in | Wt 160.2 lb

## 2017-05-22 DIAGNOSIS — I5042 Chronic combined systolic (congestive) and diastolic (congestive) heart failure: Secondary | ICD-10-CM

## 2017-05-22 DIAGNOSIS — I5030 Unspecified diastolic (congestive) heart failure: Secondary | ICD-10-CM

## 2017-05-22 DIAGNOSIS — Z950 Presence of cardiac pacemaker: Secondary | ICD-10-CM

## 2017-05-22 DIAGNOSIS — Z9889 Other specified postprocedural states: Secondary | ICD-10-CM | POA: Diagnosis not present

## 2017-05-22 DIAGNOSIS — I482 Chronic atrial fibrillation: Secondary | ICD-10-CM

## 2017-05-22 DIAGNOSIS — I4821 Permanent atrial fibrillation: Secondary | ICD-10-CM

## 2017-05-22 DIAGNOSIS — I481 Persistent atrial fibrillation: Secondary | ICD-10-CM

## 2017-05-22 NOTE — Patient Instructions (Signed)
Medication Instructions: - Your physician recommends that you continue on your current medications as directed. Please refer to the Current Medication list given to you today.  Labwork: - none ordered  Procedures/Testing: - none ordered  Follow-Up: - Remote monitoring is used to monitor your Pacemaker of ICD from home. This monitoring reduces the number of office visits required to check your device to one time per year. It allows [place] to keep an eye on the functioning of your device to ensure it is working properly. You are scheduled for a device check from home on 07/30/17. You may send your transmission at any time that day. If you have a wireless device, the transmission will be sent automatically. After your physician reviews your transmission, you will receive a postcard with your next transmission date.  - Your physician wants you to follow-up in: 1 year with Dr. Caryl Comes. You will receive a reminder letter in the mail two months in advance. If you don't receive a letter, please call our office to schedule the follow-up appointment.   Any Additional Special Instructions Will Be Listed Below (If Applicable). - call Margarita Grizzle, RN for as needed for symptoms of heart failure [phone]    If you need a refill on your cardiac medications before your next appointment, please call your pharmacy. \

## 2017-05-22 NOTE — Progress Notes (Signed)
Patient Care Team: Tracie Harrier, MD as PCP - General (Internal Medicine) Minna Merritts, MD as Consulting Physician (Cardiology) Alisa Graff, FNP as Nurse Practitioner (Family Medicine) Laverle Hobby, MD as Consulting Physician (Pulmonary Disease) Lavonia Dana, MD as Consulting Physician (Internal Medicine)   HPI  Regina Burke is a 83 y.o. female Seen in follow-up for atrial fibrillation with a rapid rate which she underwent CRT-D implantation 8/17 and subsequent AV junction ablation.  This was complicated in part by presumed phrenic pacing  reprogramming resolve this. She's had no further problems. She's had no pulsations or heart failure.   She also has coronary artery disease;  denies chest pain.  No swelling She is oxygen-dependent COPD  Hospitalized 7/18 pneumonia anemia and heart failure  And 6/18 following hip fracture   Date Cr K Hgb  6/18 2.23 5.2 8.9  9/18 1.54   8.4   She is now On hospice and palliative care for heart failure and kidney failure.  Not on anticoagulation because of GI bleeding (TG note 5/18)  She has had problems with intermittent chest pains these correlated with her optivol index.  She is nonambulatory. She has insufficient stamina. She is not interested in working with physical therapy  She denies depression  Past Medical History:  Diagnosis Date  . Anemia   . Arthritis   . Asthma   . Cardiac resynchronization therapy pacemaker (CRT-P) in place    a. 03/31/16:  Medtronic Percepta Quad CRT-P MRI SureScan (serial Number [id number]) device.  . Chronic diastolic CHF (congestive heart failure) ([place])    a. 10/2015 Echo: EF 55-65%, Gr1 DD, mild MR, mildly dil LA, nl RV fxn, nl PASP;  b. 03/2016 Echo: EF 30-35%, mod MR, mod dil LA; c. 12/2016 Echo: EF 60-65%, no rwma, mild AS, mildly dil LA, PASP 40mmHg.  . CKD (chronic kidney disease), stage IV ([place])   . COPD (chronic obstructive pulmonary disease) ([place])   . Coronary  artery disease    a. 11/2014 NSTEMI/PCI: LM nl, LAD 42m, D1 30, LCX mild dzs, OM1 20p, OM2 62m, OM3 90p (2.25x8 Promus Premier DES), RCA nl.   . Cough    CHRONIC AT NIGHT  . Cushing's disease ([place])   . Depression   . Diverticulitis   . Edema    FEET/LEGS  . GERD (gastroesophageal reflux disease)   . Gout   . History of hiatal hernia   . History of pneumonia   . HLD (hyperlipidemia)   . HOH (hard of hearing)   . Hypertensive heart disease   . Lung cancer ([place]) dx'd 2014   S/P radiation 2015  . Migraine   . Mixed Ischemic and Nonischemic Cardiomyopathy ([place])    a. 03/2016 Echo: EF 30-35%;  b. 12/2016 Echo: EF 60-65%.  . Moderate mitral regurgitation   . Multiple allergies   . Myocardial infarction ([place])   . Oxygen deficiency    2LITERS  . Persistent atrial fibrillation ([place])    a. CHADS2VASc ==> 7 (CHF, HTN, age x 2, DM, vascular disease, and gender)--was on renal dosed eliquis but this was d/c'd in 12/2016 in setting of dark stools/anemia;  c. 03/2016 s/p AVN and MDT BiV ICD placement.  . Presence of permanent cardiac pacemaker   . S/P AV nodal ablation    a. on 03/31/16 for persistant afib with CRT-P placement  . Sleep apnea   . Type II diabetes mellitus [facility])     Past Surgical  History:  Procedure Laterality Date  . ABDOMINAL HYSTERECTOMY    . ABLATION     July 2017  . ADRENALECTOMY Left 1980's   "Cushings"  . APPENDECTOMY    . BREAST CYST EXCISION Left   . CATARACT EXTRACTION W/PHACO Right 12/28/2015   Procedure: CATARACT EXTRACTION PHACO AND INTRAOCULAR LENS PLACEMENT (IOC);  Surgeon: Birder Robson, MD;  Location: [facility] ORS;  Service: Ophthalmology;  Laterality: Right;  [place] 48.4   . CATARACT EXTRACTION W/PHACO Left 11/14/2016   Procedure: CATARACT EXTRACTION PHACO AND INTRAOCULAR LENS PLACEMENT (IOC);  Surgeon: Birder Robson, MD;  Location: [facility] ORS;  Service: Ophthalmology;  Laterality: Left;  [place] 59.8 AP% 18.1 CDE 10.78 Fluid pack lot # [id number]  . CHOLECYSTECTOMY      . CORONARY ANGIOPLASTY WITH STENT PLACEMENT  12/23/2014  . ELECTROPHYSIOLOGIC STUDY N/A 03/08/2016   Procedure: CARDIOVERSION;  Surgeon: Wende Bushy, MD;  Location: [facility] ORS;  Service: Cardiovascular;  Laterality: N/A;  . ELECTROPHYSIOLOGIC STUDY N/A 03/07/2016   Procedure: Cardioversion;  Surgeon: Wende Bushy, MD;  Location: [facility] ORS;  Service: Cardiovascular;  Laterality: N/A;  . ELECTROPHYSIOLOGIC STUDY N/A 03/31/2016   Procedure: AV Node Ablation;  Surgeon: Will Meredith Leeds, MD;  Location: [place] CV LAB;  Service: Cardiovascular;  Laterality: N/A;  . EP IMPLANTABLE DEVICE N/A 03/31/2016   Procedure: BiV Pacemaker Insertion CRT-P;  Surgeon: Will Meredith Leeds, MD;  Location: [place] CV LAB;  Service: Cardiovascular;  Laterality: N/A;  . ESOPHAGOGASTRODUODENOSCOPY (EGD) WITH PROPOFOL N/A 01/26/2017   Procedure: ESOPHAGOGASTRODUODENOSCOPY (EGD) WITH PROPOFOL;  Surgeon: [name], MD;  Location: [facility] ENDOSCOPY;  Service: Endoscopy;  Laterality: N/A;  . FRACTURE SURGERY    . HIP PINNING,CANNULATED Left 02/01/2017   Procedure: CANNULATED HIP PINNING;  Surgeon: Lovell Sheehan, MD;  Location: [facility] ORS;  Service: Orthopedics;  Laterality: Left;  . INSERT / REPLACE / REMOVE PACEMAKER  02/2016  . LEFT HEART CATHETERIZATION WITH CORONARY ANGIOGRAM N/A 12/23/2014   Procedure: LEFT HEART CATHETERIZATION WITH CORONARY ANGIOGRAM;  Surgeon: Burnell Blanks, MD;  Location: [facility] CATH LAB;  Service: Cardiovascular;  Laterality: N/A;  . PERCUTANEOUS CORONARY STENT INTERVENTION (PCI-S)  12/23/2014   Procedure: PERCUTANEOUS CORONARY STENT INTERVENTION (PCI-S);  Surgeon: Burnell Blanks, MD;  Location: [facility] CATH LAB;  Service: Cardiovascular;;  Promus 2.25x8  . TONSILLECTOMY    . TRANSTHORACIC ECHOCARDIOGRAM  11/26/2015   Technically difficult study. EF 55-60%. Normal wall motion. GR 1 DD.  . TUBAL LIGATION    . WRIST FRACTURE SURGERY Bilateral ~ 2000    Current Outpatient Prescriptions   Medication Sig Dispense Refill  . ALPRAZolam (XANAX) 0.5 MG tablet Take 0.5 mg by mouth at bedtime.    . busPIRone (BUSPAR) 7.5 MG tablet Take by mouth.    . feeding supplement, ENSURE ENLIVE, (ENSURE ENLIVE) LIQD Take 237 mLs by mouth 3 (three) times daily with meals. 237 mL 12  . fenofibrate (TRICOR) 48 MG tablet Take 48 mg by mouth every evening.     . fluticasone furoate-vilanterol (BREO ELLIPTA) 100-25 MCG/INH AEPB Inhale 1 puff into the lungs daily as needed.     . levalbuterol (XOPENEX) 1.25 MG/0.5ML nebulizer solution Take 1.25 mg by nebulization every 6 (six) hours as needed for wheezing or shortness of breath. 1 each 12  . metoprolol succinate (TOPROL XL) 25 MG 24 hr tablet Take 0.5 tablets (12.5 mg total) by mouth daily.    . Multiple Vitamins-Minerals (PRESERVISION AREDS 2) CAPS Take 1 capsule by mouth daily.    Marland Kitchen  nitroGLYCERIN (NITROSTAT) 0.4 MG SL tablet Place 1 tablet (0.4 mg total) under the tongue every 5 (five) minutes as needed for chest pain. 25 tablet 3  . pantoprazole (PROTONIX) 40 MG tablet Take 40 mg by mouth every evening.     . sertraline (ZOLOFT) 50 MG tablet Take 1 tablet (50 mg total) by mouth daily. 30 tablet 2  . tiotropium (SPIRIVA) 18 MCG inhalation capsule Place 18 mcg into inhaler and inhale daily as needed.     . torsemide (DEMADEX) 20 MG tablet Take 2 tablets (40 mg total) by mouth daily. 30 tablet 0  . traMADol (ULTRAM) 50 MG tablet Take 50 mg by mouth every 6 (six) hours as needed.     No current facility-administered medications for this visit.     Allergies  Allergen Reactions  . Ciprofloxacin Shortness Of Breath, Itching and Rash  . Doxycycline Shortness Of Breath, Itching and Rash  . Penicillins Shortness Of Breath, Itching, Rash and Other (See Comments)    Has patient had a PCN reaction causing immediate rash, facial/tongue/throat swelling, SOB or lightheadedness with hypotension: Yes Has patient had a PCN reaction causing severe rash involving  mucus membranes or skin necrosis: No Has patient had a PCN reaction that required hospitalization No Has patient had a PCN reaction occurring within the last 10 years: No If all of the above answers are "NO", then may proceed with Cephalosporin use.  . Sulfa Antibiotics Shortness Of Breath, Itching and Rash  . Latex Itching  . Morphine And Related Itching  . Albuterol Sulfate   . Macrobid [Nitrofurantoin Macrocrystal] Itching  . Cefuroxime Rash    Blisters in mouth      Review of Systems negative except from HPI and PMH  Physical Exam BP 120/70 (BP Location: Left Arm, Patient Position: Sitting, Cuff Size: Normal)   Pulse 71   Ht 5\' 4"  (1.626 m)   Wt 160 lb 4 oz (72.7 kg)   BMI 27.51 kg/m  Well developed and nourished in no acute distress on chronic oxygen HENT normal Neck supple with JVP-flat Carotids brisk and full without bruits Clear Regular rate and rhythm, no murmurs or gallops Abd-soft with active BS without hepatomegaly No Clubbing cyanosis tr edema Skin-warm and dry A & Oriented  Grossly normal sensory and motor function   ECG demonstrates atrial fibrillation with biventricular pacing-upright QRS in lead V1  Assessment and  Plan  AV junction ablation  Atrial fibrillation-permanent  HFpEF  Pacemaker-CRT-Medtronic  Coronary artery disease status post stenting LAD 4/16  Renal insufficiency   GFR  20  (8/17)   Euvolemic continue current meds  Not on anticoagulation --see above  Discussed PT, but she is disinclined   Denies depression   More than 50% of 40 min was spent in counseling related to the above   Current medicines are reviewed at length with the patient today .  The patient does not  have concerns regarding medicines.

## 2017-05-22 NOTE — Progress Notes (Signed)
Referred to [facility] clinic on as needed basis by Dr Caryl Comes.  Met patient and son in office. She is on hospice and palliative care for heart failure and kidney failure. She has had problems with intermittent chest pains these correlated with her optivol index.  Patient will be followed on PRN basis for comfort measures.  She has been instructed to call for any fluid symptoms and son is able to send manual transmissions when needed.  [facility] clinic number given.

## 2017-05-28 LAB — CUP PACEART INCLINIC DEVICE CHECK
Battery Remaining Longevity: 75 mo
Battery Voltage: 3 V
Brady Statistic AP VP Percent: 0 %
Brady Statistic AS VP Percent: 99.99 %
Brady Statistic AS VS Percent: 0.01 %
Date Time Interrogation Session: 20180925145347
Implantable Lead Implant Date: 20170804
Implantable Lead Location: 753860
Implantable Lead Model: 4298
Implantable Pulse Generator Implant Date: 20170804
Lead Channel Impedance Value: 418 Ohm
Lead Channel Impedance Value: 532 Ohm
Lead Channel Impedance Value: 931 Ohm
Lead Channel Pacing Threshold Amplitude: 1.75 V
Lead Channel Pacing Threshold Pulse Width: 0.4 ms
Lead Channel Pacing Threshold Pulse Width: 1.5 ms
Lead Channel Setting Pacing Amplitude: 2.5 V
Lead Channel Setting Pacing Pulse Width: 0.4 ms
Lead Channel Setting Sensing Sensitivity: 4 mV
MDC IDC LEAD IMPLANT DT: 20170804
MDC IDC LEAD LOCATION: 753858
MDC IDC MSMT LEADCHNL LV IMPEDANCE VALUE: 551 Ohm
MDC IDC MSMT LEADCHNL LV IMPEDANCE VALUE: 627 Ohm
MDC IDC MSMT LEADCHNL LV IMPEDANCE VALUE: 646 Ohm
MDC IDC MSMT LEADCHNL LV IMPEDANCE VALUE: 798 Ohm
MDC IDC MSMT LEADCHNL LV IMPEDANCE VALUE: 931 Ohm
MDC IDC MSMT LEADCHNL RA IMPEDANCE VALUE: 3344 Ohm
MDC IDC MSMT LEADCHNL RA IMPEDANCE VALUE: 3344 Ohm
MDC IDC MSMT LEADCHNL RV PACING THRESHOLD AMPLITUDE: 0.5 V
MDC IDC SET LEADCHNL LV PACING AMPLITUDE: 2.5 V
MDC IDC SET LEADCHNL LV PACING PULSEWIDTH: 1.5 ms
MDC IDC STAT BRADY AP VS PERCENT: 0 %
MDC IDC STAT BRADY RA PERCENT PACED: 0 %
MDC IDC STAT BRADY RV PERCENT PACED: 99.99 %

## 2017-07-05 ENCOUNTER — Telehealth: Payer: Self-pay | Admitting: *Deleted

## 2017-07-05 NOTE — Telephone Encounter (Signed)
Per Dr Grayland Ormond, cancel all procrit injection since she is hospice. Otila Kluver informed that lab/ procrit appts have been cancelled and that I left her physician appointment for March in place. She will notify family

## 2017-07-05 NOTE — Telephone Encounter (Signed)
Patient opened to hospice services and is scheduled for Procrit injection next week. Asking if this is aggressive treatment or comfort. Not sure hospice will pay for it. Please advise  ASSESSMENT: Anemia secondary to chronic renal failure, clinical stage IA adenocarcinoma of right upper lobe lung.  PLAN:    1. Anemia secondary to chronic renal failure: Patient's hemoglobin has trended below 10.0, therefore will proceed with 40,000 units of Procrit today. Patient is requesting less frequent follow-up therefore she will return to clinic every 2 months for laboratory work and consideration of Procrit. Return to clinic in 6 months with repeat laboratory work and further evaluation.  2. Clinical stage IA adenocarcinoma of right upper lobe lung: Patient is status post stereotactic radiation therapy. Repeat CT scan on Jan 24, 2017 did not reveal any recurrence or progressive disease. Given patient's multiple comorbidities and declining performance status, no further treatment is recommended. Given the patient is now enrolled in hospice, will not do any further imaging either. Follow-up as above.  3. Chronic renal failure: Since creatinine appears to be at her baseline.  Continue evaluation and treatment per nephrology as needed.   Patient expressed understanding and was in agreement with this plan. She also understands that She can call clinic at any time with any questions, concerns, or complaints.   Cancer Staging Cancer of upper lobe of right lung [facility]) Staging form: Lung, AJCC 7th Edition - Clinical stage from 10/08/2016: Stage IA (T1a, N0, M0) - Signed by Lloyd Huger, MD on 10/08/2016   Lloyd Huger, MD   05/07/2017 12:25 PM

## 2017-07-09 ENCOUNTER — Inpatient Hospital Stay: Payer: Medicare Other

## 2017-07-30 ENCOUNTER — Ambulatory Visit (INDEPENDENT_AMBULATORY_CARE_PROVIDER_SITE_OTHER): Admitting: *Deleted

## 2017-07-30 ENCOUNTER — Encounter: Payer: Self-pay | Admitting: Cardiology

## 2017-07-30 DIAGNOSIS — I482 Chronic atrial fibrillation: Secondary | ICD-10-CM

## 2017-07-30 DIAGNOSIS — I4821 Permanent atrial fibrillation: Secondary | ICD-10-CM

## 2017-07-30 NOTE — Progress Notes (Signed)
Remote pacemaker transmission.   

## 2017-07-31 LAB — CUP PACEART REMOTE DEVICE CHECK
Battery Remaining Longevity: 73 mo
Brady Statistic AP VS Percent: 0 %
Brady Statistic AS VP Percent: 99.99 %
Brady Statistic AS VS Percent: 0.01 %
Brady Statistic RV Percent Paced: 99.99 %
Implantable Lead Implant Date: 20170804
Implantable Lead Location: 753858
Implantable Lead Model: 5076
Lead Channel Impedance Value: 323 Ohm
Lead Channel Impedance Value: 3344 Ohm
Lead Channel Impedance Value: 437 Ohm
Lead Channel Impedance Value: 513 Ohm
Lead Channel Impedance Value: 703 Ohm
Lead Channel Impedance Value: 722 Ohm
Lead Channel Impedance Value: 760 Ohm
Lead Channel Impedance Value: 950 Ohm
Lead Channel Pacing Threshold Amplitude: 0.5 V
Lead Channel Pacing Threshold Amplitude: 2.5 V
Lead Channel Pacing Threshold Pulse Width: 0.4 ms
Lead Channel Setting Pacing Amplitude: 2.5 V
Lead Channel Setting Pacing Amplitude: 2.5 V
Lead Channel Setting Pacing Pulse Width: 0.4 ms
Lead Channel Setting Pacing Pulse Width: 1.5 ms
MDC IDC LEAD IMPLANT DT: 20170804
MDC IDC LEAD LOCATION: 753860
MDC IDC MSMT BATTERY VOLTAGE: 2.99 V
MDC IDC MSMT LEADCHNL LV IMPEDANCE VALUE: 1140 Ohm
MDC IDC MSMT LEADCHNL LV IMPEDANCE VALUE: 1140 Ohm
MDC IDC MSMT LEADCHNL LV IMPEDANCE VALUE: 513 Ohm
MDC IDC MSMT LEADCHNL LV IMPEDANCE VALUE: 551 Ohm
MDC IDC MSMT LEADCHNL LV PACING THRESHOLD PULSEWIDTH: 1 ms
MDC IDC MSMT LEADCHNL RA IMPEDANCE VALUE: 3344 Ohm
MDC IDC MSMT LEADCHNL RV IMPEDANCE VALUE: 532 Ohm
MDC IDC MSMT LEADCHNL RV SENSING INTR AMPL: 18.125 mV
MDC IDC PG IMPLANT DT: 20170804
MDC IDC SESS DTM: 20181203043740
MDC IDC SET LEADCHNL RV SENSING SENSITIVITY: 4 mV
MDC IDC STAT BRADY AP VP PERCENT: 0 %
MDC IDC STAT BRADY RA PERCENT PACED: 0 %

## 2017-07-31 IMAGING — CR DG CHEST 2V
2 series · 2 of 2 positions shown · non-contrast
Comparison: 03/27/2016

CLINICAL DATA: S/P PACEMAKER INSERTION, PT UNABLE TO RAISE LEFT ARM

EXAM:
CHEST  2 VIEW

[chest lat]
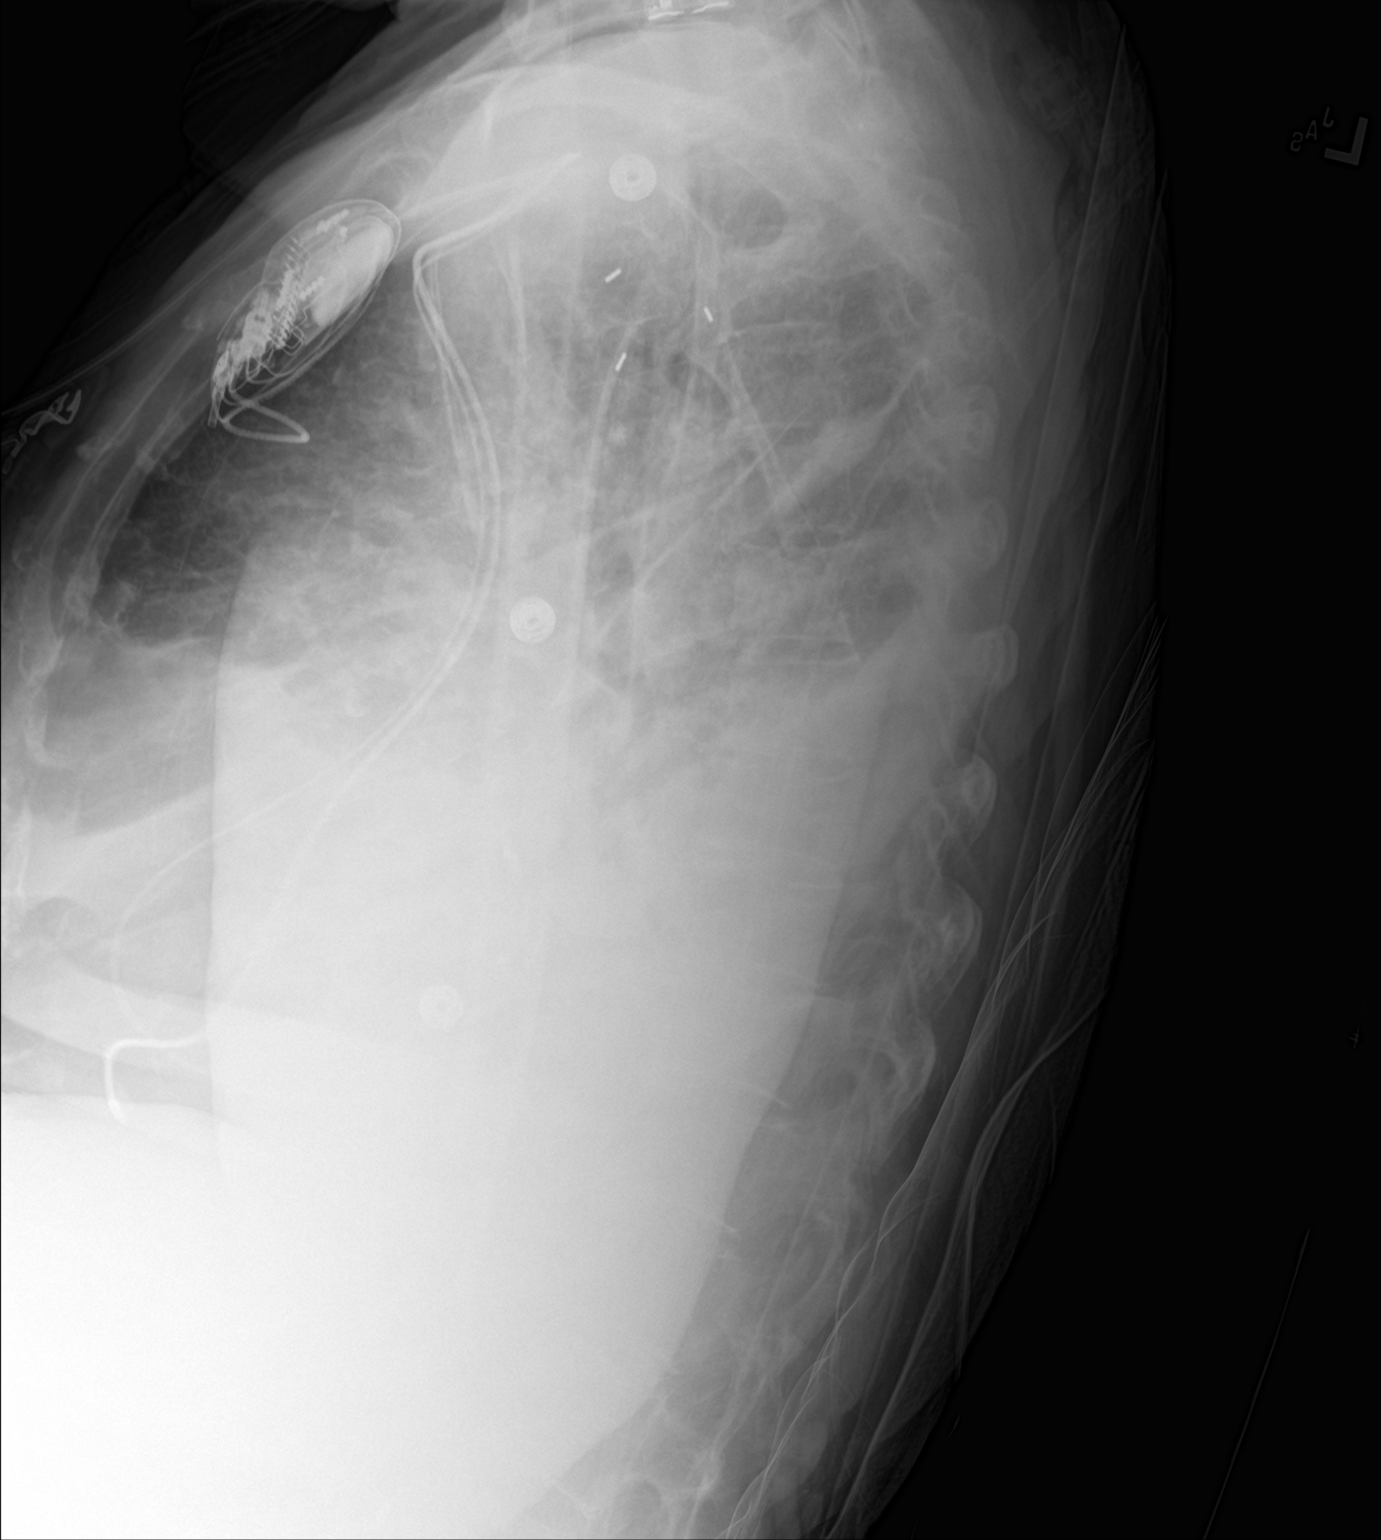

[chest ap]
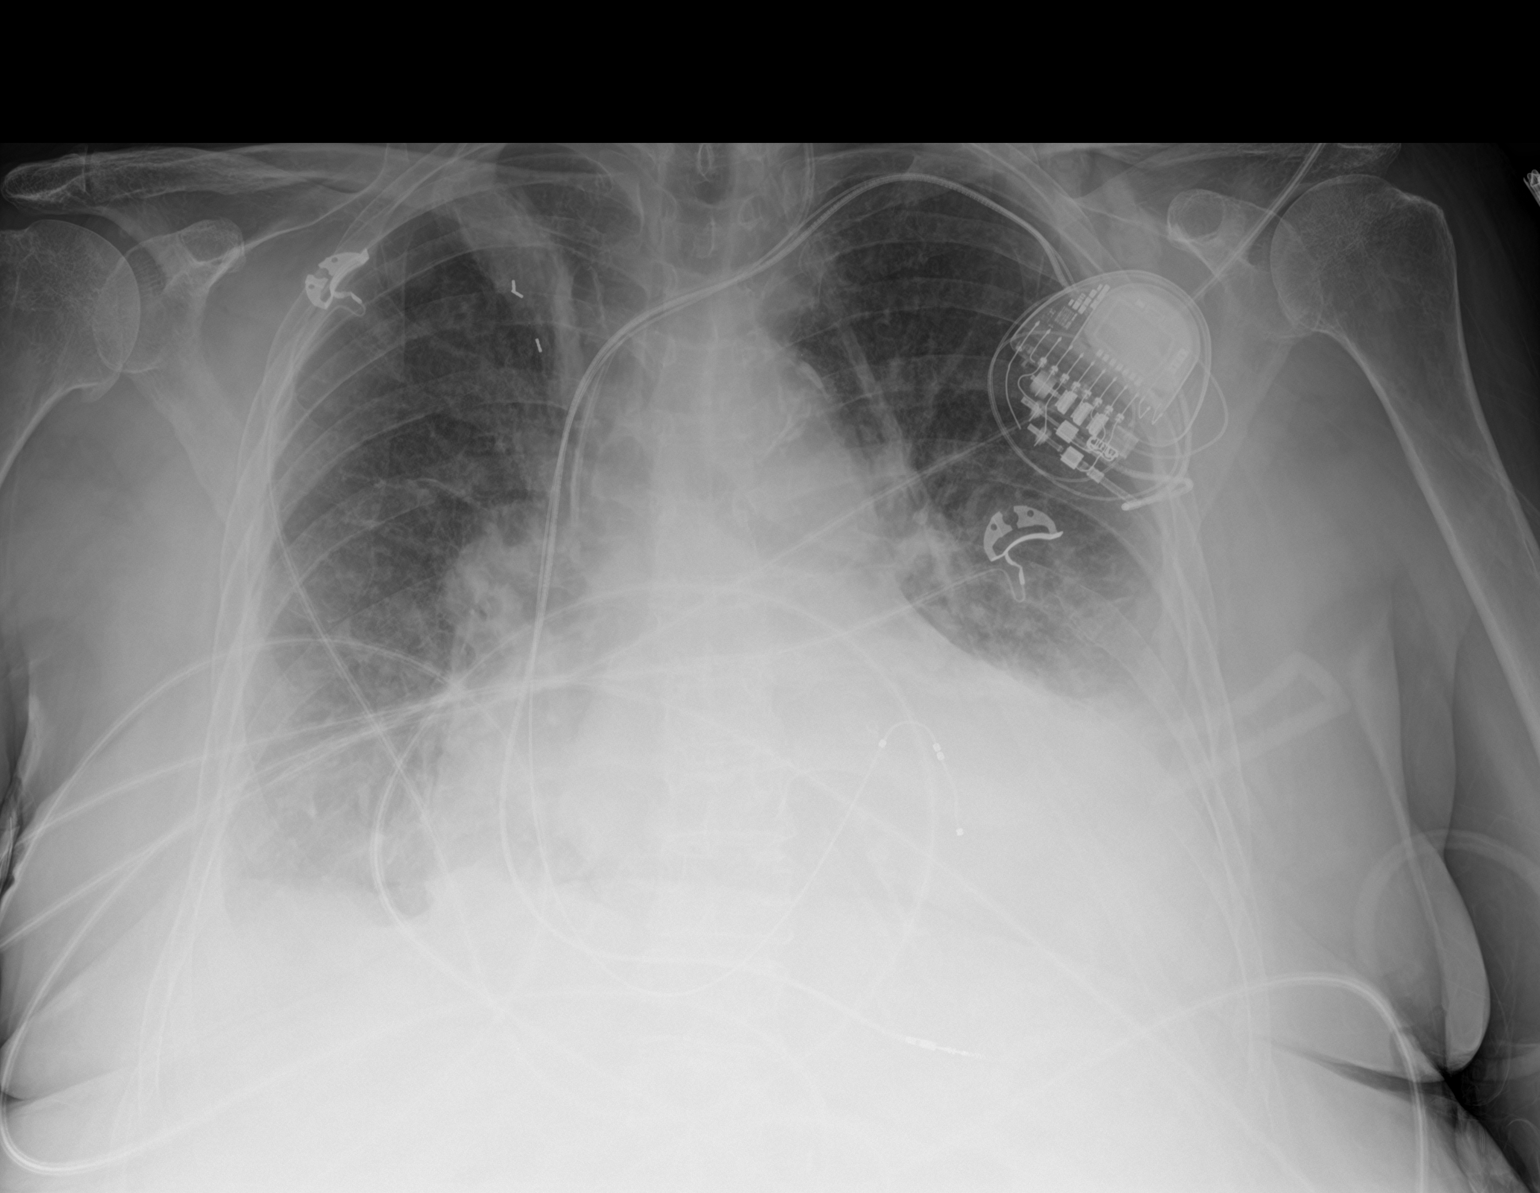

[2 of 2 positions shown; findings below may reference images not displayed]

FINDINGS: Pacemaker overlies enlarged cardiac silhouette. There is bibasilar
effusions and atelectasis not changed. Upper lungs are clear. There
is scarring in the RIGHT upper lobe.
IMPRESSION: 1. No significant change.
2. Cardiomegaly, bibasilar atelectasis and small effusions

## 2017-09-06 ENCOUNTER — Other Ambulatory Visit: Payer: Medicare Other

## 2017-09-06 ENCOUNTER — Ambulatory Visit: Payer: Medicare Other

## 2017-10-29 ENCOUNTER — Ambulatory Visit (INDEPENDENT_AMBULATORY_CARE_PROVIDER_SITE_OTHER): Payer: Medicare Other | Admitting: *Deleted

## 2017-10-29 DIAGNOSIS — I255 Ischemic cardiomyopathy: Secondary | ICD-10-CM | POA: Diagnosis not present

## 2017-10-29 NOTE — Progress Notes (Signed)
Remote pacemaker transmission.   

## 2017-10-30 LAB — CUP PACEART REMOTE DEVICE CHECK
Brady Statistic AP VS Percent: 0 %
Brady Statistic AS VP Percent: 99.99 %
Brady Statistic AS VS Percent: 0.01 %
Implantable Lead Implant Date: 20170804
Implantable Lead Model: 5076
Lead Channel Impedance Value: 1007 Ohm
Lead Channel Impedance Value: 570 Ohm
Lead Channel Impedance Value: 570 Ohm
Lead Channel Impedance Value: 779 Ohm
Lead Channel Impedance Value: 817 Ohm
Lead Channel Pacing Threshold Amplitude: 0.5 V
Lead Channel Pacing Threshold Amplitude: 2.5 V
Lead Channel Pacing Threshold Pulse Width: 0.4 ms
Lead Channel Pacing Threshold Pulse Width: 1 ms
Lead Channel Setting Pacing Amplitude: 2.5 V
Lead Channel Setting Pacing Amplitude: 2.5 V
Lead Channel Setting Pacing Pulse Width: 0.4 ms
Lead Channel Setting Pacing Pulse Width: 1.5 ms
MDC IDC LEAD IMPLANT DT: 20170804
MDC IDC LEAD LOCATION: 753858
MDC IDC LEAD LOCATION: 753860
MDC IDC MSMT BATTERY REMAINING LONGEVITY: 72 mo
MDC IDC MSMT BATTERY VOLTAGE: 2.99 V
MDC IDC MSMT LEADCHNL LV IMPEDANCE VALUE: 1216 Ohm
MDC IDC MSMT LEADCHNL LV IMPEDANCE VALUE: 1425 Ohm
MDC IDC MSMT LEADCHNL LV IMPEDANCE VALUE: 1444 Ohm
MDC IDC MSMT LEADCHNL LV IMPEDANCE VALUE: 342 Ohm
MDC IDC MSMT LEADCHNL LV IMPEDANCE VALUE: 608 Ohm
MDC IDC MSMT LEADCHNL RA IMPEDANCE VALUE: 3344 Ohm
MDC IDC MSMT LEADCHNL RA IMPEDANCE VALUE: 3344 Ohm
MDC IDC MSMT LEADCHNL RV IMPEDANCE VALUE: 437 Ohm
MDC IDC MSMT LEADCHNL RV IMPEDANCE VALUE: 532 Ohm
MDC IDC MSMT LEADCHNL RV SENSING INTR AMPL: 18.125 mV
MDC IDC PG IMPLANT DT: 20170804
MDC IDC SESS DTM: 20190304004130
MDC IDC SET LEADCHNL RV SENSING SENSITIVITY: 4 mV
MDC IDC STAT BRADY AP VP PERCENT: 0 %
MDC IDC STAT BRADY RA PERCENT PACED: 0 %
MDC IDC STAT BRADY RV PERCENT PACED: 99.99 %

## 2017-10-31 ENCOUNTER — Encounter: Payer: Self-pay | Admitting: Cardiology

## 2017-11-02 ENCOUNTER — Other Ambulatory Visit: Payer: Self-pay | Admitting: *Deleted

## 2017-11-02 DIAGNOSIS — N189 Chronic kidney disease, unspecified: Principal | ICD-10-CM

## 2017-11-02 DIAGNOSIS — D631 Anemia in chronic kidney disease: Secondary | ICD-10-CM

## 2017-11-02 NOTE — Progress Notes (Signed)
cbc

## 2017-11-04 NOTE — Progress Notes (Deleted)
[facility]  Telephone:([phone]) [phone] Fax:(336) [phone]  ID: SHENISE WOLGAMOTT OB: 1933-07-06  MR#: [id number]  RCV#:[id number]  Patient Care Team: Tracie Harrier, MD as PCP - General (Internal Medicine) Minna Merritts, MD as Consulting Physician (Cardiology) Alisa Graff, FNP as Nurse Practitioner (Family Medicine) Laverle Hobby, MD as Consulting Physician (Pulmonary Disease) Lavonia Dana, MD as Consulting Physician (Internal Medicine)  CHIEF COMPLAINT: Anemia secondary to chronic renal failure, clinical stage IA adenocarcinoma of right upper lobe lung.  INTERVAL HISTORY: Patient returns to clinic today for repeat laboratory work and further evaluation. She continues to have chronic weakness and fatigue which is unchanged. She has multiple medical complaints that are chronic and unchanged. She was recently enrolled in hospice after her most recent hospital admission. She has no new neurologic complaints. She denies any recent fevers. She denies any chest pain, but has chronic shortness of breath and requires oxygen 24 hours per day. She has no nausea, vomiting, constipation, or diarrhea. She has no urinary complaints. Patient offers no further specific complaints today.  REVIEW OF SYSTEMS:   Review of Systems  Constitutional: Positive for malaise/fatigue. Negative for fever and weight loss.  Respiratory: Positive for shortness of breath. Negative for cough and hemoptysis.   Cardiovascular: Positive for leg swelling. Negative for chest pain.  Gastrointestinal: Negative.  Negative for abdominal pain, blood in stool, diarrhea and melena.  Genitourinary: Negative.   Musculoskeletal: Negative.   Skin: Negative.  Negative for rash.  Neurological: Positive for dizziness and weakness. Negative for focal weakness.  Psychiatric/Behavioral: Negative.  The patient is not nervous/anxious.     As per HPI. Otherwise, a complete review of systems is  negative.  PAST MEDICAL HISTORY: Past Medical History:  Diagnosis Date  . Anemia   . Arthritis   . Asthma   . Cardiac resynchronization therapy pacemaker (CRT-P) in place    a. 03/31/16:  Medtronic Percepta Quad CRT-P MRI SureScan (serial Number [id number]) device.  . Chronic diastolic CHF (congestive heart failure) ([place])    a. 10/2015 Echo: EF 55-65%, Gr1 DD, mild MR, mildly dil LA, nl RV fxn, nl PASP;  b. 03/2016 Echo: EF 30-35%, mod MR, mod dil LA; c. 12/2016 Echo: EF 60-65%, no rwma, mild AS, mildly dil LA, PASP 30mmHg.  . CKD (chronic kidney disease), stage IV ([place])   . COPD (chronic obstructive pulmonary disease) ([place])   . Coronary artery disease    a. 11/2014 NSTEMI/PCI: LM nl, LAD 39m, D1 30, LCX mild dzs, OM1 20p, OM2 83m, OM3 90p (2.25x8 Promus Premier DES), RCA nl.   . Cough    CHRONIC AT NIGHT  . Cushing's disease ([place])   . Depression   . Diverticulitis   . Edema    FEET/LEGS  . GERD (gastroesophageal reflux disease)   . Gout   . History of hiatal hernia   . History of pneumonia   . HLD (hyperlipidemia)   . HOH (hard of hearing)   . Hypertensive heart disease   . Lung cancer ([place]) dx'd 2014   S/P radiation 2015  . Migraine   . Mixed Ischemic and Nonischemic Cardiomyopathy ([place])    a. 03/2016 Echo: EF 30-35%;  b. 12/2016 Echo: EF 60-65%.  . Moderate mitral regurgitation   . Multiple allergies   . Myocardial infarction ([place])   . Oxygen deficiency    2LITERS  . Persistent atrial fibrillation ([place])    a. CHADS2VASc ==> 7 (CHF, HTN, age x 2, DM, vascular disease,  and gender)--was on renal dosed eliquis but this was d/c'd in 12/2016 in setting of dark stools/anemia;  c. 03/2016 s/p AVN and MDT BiV ICD placement.  . Presence of permanent cardiac pacemaker   . S/P AV nodal ablation    a. on 03/31/16 for persistant afib with CRT-P placement  . Sleep apnea   . Type II diabetes mellitus ([place])     PAST SURGICAL HISTORY: Past Surgical History:  Procedure Laterality Date  .  ABDOMINAL HYSTERECTOMY    . ABLATION     July 2017  . ADRENALECTOMY Left 1980's   "[place]"  . APPENDECTOMY    . BREAST CYST EXCISION Left   . CATARACT EXTRACTION W/PHACO Right 12/28/2015   Procedure: CATARACT EXTRACTION PHACO AND INTRAOCULAR LENS PLACEMENT (IOC);  Surgeon: Birder Robson, MD;  Location: [facility] ORS;  Service: Ophthalmology;  Laterality: Right;  [place] 48.4   . CATARACT EXTRACTION W/PHACO Left 11/14/2016   Procedure: CATARACT EXTRACTION PHACO AND INTRAOCULAR LENS PLACEMENT (IOC);  Surgeon: Birder Robson, MD;  Location: [facility] ORS;  Service: Ophthalmology;  Laterality: Left;  [place] 59.8 AP% 18.1 CDE 10.78 Fluid pack lot # [id number]  . CHOLECYSTECTOMY    . CORONARY ANGIOPLASTY WITH STENT PLACEMENT  12/23/2014  . ELECTROPHYSIOLOGIC STUDY N/A 03/08/2016   Procedure: CARDIOVERSION;  Surgeon: Wende Bushy, MD;  Location: [facility] ORS;  Service: Cardiovascular;  Laterality: N/A;  . ELECTROPHYSIOLOGIC STUDY N/A 03/07/2016   Procedure: Cardioversion;  Surgeon: Wende Bushy, MD;  Location: [facility] ORS;  Service: Cardiovascular;  Laterality: N/A;  . ELECTROPHYSIOLOGIC STUDY N/A 03/31/2016   Procedure: AV Node Ablation;  Surgeon: Will Meredith Leeds, MD;  Location: [place] CV LAB;  Service: Cardiovascular;  Laterality: N/A;  . EP IMPLANTABLE DEVICE N/A 03/31/2016   Procedure: BiV Pacemaker Insertion CRT-P;  Surgeon: Will Meredith Leeds, MD;  Location: [place] CV LAB;  Service: Cardiovascular;  Laterality: N/A;  . ESOPHAGOGASTRODUODENOSCOPY (EGD) WITH PROPOFOL N/A 01/26/2017   Procedure: ESOPHAGOGASTRODUODENOSCOPY (EGD) WITH PROPOFOL;  Surgeon: [name], MD;  Location: [facility] ENDOSCOPY;  Service: Endoscopy;  Laterality: N/A;  . FRACTURE SURGERY    . HIP PINNING,CANNULATED Left 02/01/2017   Procedure: CANNULATED HIP PINNING;  Surgeon: Lovell Sheehan, MD;  Location: [facility] ORS;  Service: Orthopedics;  Laterality: Left;  . INSERT / REPLACE / REMOVE PACEMAKER  02/2016  . LEFT HEART CATHETERIZATION WITH  CORONARY ANGIOGRAM N/A 12/23/2014   Procedure: LEFT HEART CATHETERIZATION WITH CORONARY ANGIOGRAM;  Surgeon: Burnell Blanks, MD;  Location: [facility] CATH LAB;  Service: Cardiovascular;  Laterality: N/A;  . PERCUTANEOUS CORONARY STENT INTERVENTION (PCI-S)  12/23/2014   Procedure: PERCUTANEOUS CORONARY STENT INTERVENTION (PCI-S);  Surgeon: Burnell Blanks, MD;  Location: [facility] CATH LAB;  Service: Cardiovascular;;  Promus 2.25x8  . TONSILLECTOMY    . TRANSTHORACIC ECHOCARDIOGRAM  11/26/2015   Technically difficult study. EF 55-60%. Normal wall motion. GR 1 DD.  . TUBAL LIGATION    . WRIST FRACTURE SURGERY Bilateral ~ 2000    FAMILY HISTORY: Family History  Problem Relation Age of Onset  . Heart disease Mother   . Diabetes Mother   . Osteoarthritis Mother   . Hypertension Mother   . Heart disease Father   . Hypertension Father   . COPD Brother     ADVANCED DIRECTIVES (Y/N):  N  HEALTH MAINTENANCE: Social History   Tobacco Use  . Smoking status: Former Smoker    Packs/day: 1.00    Years: 45.00    Pack years: 45.00    Types: Cigarettes  Last attempt to quit: 04/25/1994    Years since quitting: 23.5  . Smokeless tobacco: Never Used  Substance Use Topics  . Alcohol use: No    Comment: 12/23/2014 "might have a couple mixed drinks/year"  . Drug use: No     Colonoscopy:  PAP:  Bone density:  Lipid panel:  Allergies  Allergen Reactions  . Ciprofloxacin Shortness Of Breath, Itching and Rash  . Doxycycline Shortness Of Breath, Itching and Rash  . Penicillins Shortness Of Breath, Itching, Rash and Other (See Comments)    Has patient had a PCN reaction causing immediate rash, facial/tongue/throat swelling, SOB or lightheadedness with hypotension: Yes Has patient had a PCN reaction causing severe rash involving mucus membranes or skin necrosis: No Has patient had a PCN reaction that required hospitalization No Has patient had a PCN reaction occurring within the last 10  years: No If all of the above answers are "NO", then may proceed with Cephalosporin use.  . Sulfa Antibiotics Shortness Of Breath, Itching and Rash  . Latex Itching  . Morphine And Related Itching  . Albuterol Sulfate   . Macrobid [Nitrofurantoin Macrocrystal] Itching  . Cefuroxime Rash    Blisters in mouth    Current Outpatient Medications  Medication Sig Dispense Refill  . ALPRAZolam (XANAX) 0.5 MG tablet Take 0.5 mg by mouth at bedtime.    . busPIRone (BUSPAR) 7.5 MG tablet Take by mouth.    . feeding supplement, ENSURE ENLIVE, (ENSURE ENLIVE) LIQD Take 237 mLs by mouth 3 (three) times daily with meals. 237 mL 12  . fenofibrate (TRICOR) 48 MG tablet Take 48 mg by mouth every evening.     . fluticasone furoate-vilanterol (BREO ELLIPTA) 100-25 MCG/INH AEPB Inhale 1 puff into the lungs daily as needed.     . levalbuterol (XOPENEX) 1.25 MG/0.5ML nebulizer solution Take 1.25 mg by nebulization every 6 (six) hours as needed for wheezing or shortness of breath. 1 each 12  . metoprolol succinate (TOPROL XL) 25 MG 24 hr tablet Take 0.5 tablets (12.5 mg total) by mouth daily.    . Multiple Vitamins-Minerals (PRESERVISION AREDS 2) CAPS Take 1 capsule by mouth daily.    . nitroGLYCERIN (NITROSTAT) 0.4 MG SL tablet Place 1 tablet (0.4 mg total) under the tongue every 5 (five) minutes as needed for chest pain. 25 tablet 3  . pantoprazole (PROTONIX) 40 MG tablet Take 40 mg by mouth every evening.     . sertraline (ZOLOFT) 50 MG tablet Take 1 tablet (50 mg total) by mouth daily. 30 tablet 2  . tiotropium (SPIRIVA) 18 MCG inhalation capsule Place 18 mcg into inhaler and inhale daily as needed.     . torsemide (DEMADEX) 20 MG tablet Take 2 tablets (40 mg total) by mouth daily. 30 tablet 0  . traMADol (ULTRAM) 50 MG tablet Take 50 mg by mouth every 6 (six) hours as needed.     No current facility-administered medications for this visit.     OBJECTIVE: There were no vitals filed for this visit.    There is no height or weight on file to calculate BMI.    ECOG FS:2 - Symptomatic, <50% confined to bed  General: Well-developed, well-nourished, no acute distress. Eyes: Pink conjunctiva, anicteric sclera. Lungs: Diminished breath sounds bilaterally. Heart: Regular rate and rhythm. No rubs, murmurs, or gallops. Abdomen: Soft, nontender, nondistended. No organomegaly noted, normoactive bowel sounds. Musculoskeletal: No edema, cyanosis, or clubbing. Neuro: Alert, answering all questions appropriately. Cranial nerves grossly intact. Skin: No rashes  or petechiae noted. Psych: Normal affect.   LAB RESULTS:  Lab Results  Component Value Date   NA 137 02/28/2017   K 3.9 02/28/2017   CL 94 (L) 02/28/2017   CO2 31 02/28/2017   GLUCOSE 104 (H) 02/28/2017   BUN 43 (H) 02/28/2017   CREATININE 1.54 (H) 02/28/2017   CALCIUM 8.2 (L) 02/28/2017   PROT 6.5 02/02/2017   ALBUMIN 3.0 (L) 02/02/2017   AST 21 02/02/2017   ALT 12 (L) 02/02/2017   ALKPHOS 49 02/02/2017   BILITOT 0.9 02/02/2017   GFRNONAA 30 (L) 02/28/2017   GFRAA 35 (L) 02/28/2017    Lab Results  Component Value Date   WBC 11.0 02/28/2017   NEUTROABS 9.5 (H) 01/31/2017   HGB 8.6 (L) 05/07/2017   HCT 22.7 (L) 02/28/2017   MCV 82.2 02/28/2017   PLT 212 02/28/2017   Lab Results  Component Value Date   IRON 56 12/04/2016   TIBC 444 12/04/2016   IRONPCTSAT 13 12/04/2016   Lab Results  Component Value Date   FERRITIN 71 12/04/2016     STUDIES: No results found.  ASSESSMENT: Anemia secondary to chronic renal failure, clinical stage IA adenocarcinoma of right upper lobe lung.  PLAN:    1. Anemia secondary to chronic renal failure: Patient's hemoglobin has trended below 10.0, therefore will proceed with 40,000 units of Procrit today. Patient is requesting less frequent follow-up therefore she will return to clinic every 2 months for laboratory work and consideration of Procrit. Return to clinic in 6 months with repeat  laboratory work and further evaluation.  2. Clinical stage IA adenocarcinoma of right upper lobe lung: Patient is status post stereotactic radiation therapy. Repeat CT scan on Jan 24, 2017 did not reveal any recurrence or progressive disease. Given patient's multiple comorbidities and declining performance status, no further treatment is recommended. Given the patient is now enrolled in hospice, will not do any further imaging either. Follow-up as above.  3. Chronic renal failure: Since creatinine appears to be at her baseline.  Continue evaluation and treatment per nephrology as needed.   Patient expressed understanding and was in agreement with this plan. She also understands that She can call clinic at any time with any questions, concerns, or complaints.   Cancer Staging Cancer of upper lobe of right lung [facility]) Staging form: Lung, AJCC 7th Edition - Clinical stage from 10/08/2016: Stage IA (T1a, N0, M0) - Signed by Lloyd Huger, MD on 10/08/2016   Lloyd Huger, MD   11/04/2017 10:17 AM

## 2017-11-05 ENCOUNTER — Ambulatory Visit: Payer: Medicare Other

## 2017-11-05 ENCOUNTER — Other Ambulatory Visit: Payer: Medicare Other

## 2017-11-05 ENCOUNTER — Inpatient Hospital Stay: Admitting: Oncology

## 2018-01-15 ENCOUNTER — Other Ambulatory Visit: Payer: Self-pay

## 2018-01-15 ENCOUNTER — Inpatient Hospital Stay
Admission: EM | Admit: 2018-01-15 | Discharge: 2018-01-22 | DRG: 291 | Disposition: A | Discharge status: Home Health | Payer: Medicare Other | Attending: Internal Medicine | Admitting: Internal Medicine

## 2018-01-15 ENCOUNTER — Encounter: Payer: Self-pay | Admitting: Emergency Medicine

## 2018-01-15 ENCOUNTER — Emergency Department: Payer: Medicare Other

## 2018-01-15 DIAGNOSIS — F329 Major depressive disorder, single episode, unspecified: Secondary | ICD-10-CM | POA: Diagnosis present

## 2018-01-15 DIAGNOSIS — Z825 Family history of asthma and other chronic lower respiratory diseases: Secondary | ICD-10-CM

## 2018-01-15 DIAGNOSIS — H9190 Unspecified hearing loss, unspecified ear: Secondary | ICD-10-CM | POA: Diagnosis present

## 2018-01-15 DIAGNOSIS — N184 Chronic kidney disease, stage 4 (severe): Secondary | ICD-10-CM | POA: Diagnosis present

## 2018-01-15 DIAGNOSIS — D696 Thrombocytopenia, unspecified: Secondary | ICD-10-CM | POA: Diagnosis present

## 2018-01-15 DIAGNOSIS — J189 Pneumonia, unspecified organism: Secondary | ICD-10-CM | POA: Diagnosis present

## 2018-01-15 DIAGNOSIS — Y95 Nosocomial condition: Secondary | ICD-10-CM | POA: Diagnosis present

## 2018-01-15 DIAGNOSIS — I5043 Acute on chronic combined systolic (congestive) and diastolic (congestive) heart failure: Secondary | ICD-10-CM | POA: Diagnosis present

## 2018-01-15 DIAGNOSIS — J9621 Acute and chronic respiratory failure with hypoxia: Secondary | ICD-10-CM | POA: Diagnosis present

## 2018-01-15 DIAGNOSIS — D631 Anemia in chronic kidney disease: Secondary | ICD-10-CM | POA: Diagnosis not present

## 2018-01-15 DIAGNOSIS — Z955 Presence of coronary angioplasty implant and graft: Secondary | ICD-10-CM | POA: Diagnosis not present

## 2018-01-15 DIAGNOSIS — J9622 Acute and chronic respiratory failure with hypercapnia: Secondary | ICD-10-CM | POA: Diagnosis present

## 2018-01-15 DIAGNOSIS — Z79899 Other long term (current) drug therapy: Secondary | ICD-10-CM

## 2018-01-15 DIAGNOSIS — Z885 Allergy status to narcotic agent status: Secondary | ICD-10-CM | POA: Diagnosis not present

## 2018-01-15 DIAGNOSIS — J441 Chronic obstructive pulmonary disease with (acute) exacerbation: Secondary | ICD-10-CM | POA: Diagnosis not present

## 2018-01-15 DIAGNOSIS — I2510 Atherosclerotic heart disease of native coronary artery without angina pectoris: Secondary | ICD-10-CM | POA: Diagnosis present

## 2018-01-15 DIAGNOSIS — Z79891 Long term (current) use of opiate analgesic: Secondary | ICD-10-CM

## 2018-01-15 DIAGNOSIS — Z85118 Personal history of other malignant neoplasm of bronchus and lung: Secondary | ICD-10-CM

## 2018-01-15 DIAGNOSIS — Z66 Do not resuscitate: Secondary | ICD-10-CM | POA: Diagnosis not present

## 2018-01-15 DIAGNOSIS — E119 Type 2 diabetes mellitus without complications: Secondary | ICD-10-CM

## 2018-01-15 DIAGNOSIS — J432 Centrilobular emphysema: Secondary | ICD-10-CM | POA: Diagnosis not present

## 2018-01-15 DIAGNOSIS — Z880 Allergy status to penicillin: Secondary | ICD-10-CM | POA: Diagnosis not present

## 2018-01-15 DIAGNOSIS — I272 Pulmonary hypertension, unspecified: Secondary | ICD-10-CM | POA: Diagnosis present

## 2018-01-15 DIAGNOSIS — E785 Hyperlipidemia, unspecified: Secondary | ICD-10-CM | POA: Diagnosis present

## 2018-01-15 DIAGNOSIS — X58XXXA Exposure to other specified factors, initial encounter: Secondary | ICD-10-CM | POA: Diagnosis present

## 2018-01-15 DIAGNOSIS — I361 Nonrheumatic tricuspid (valve) insufficiency: Secondary | ICD-10-CM | POA: Diagnosis not present

## 2018-01-15 DIAGNOSIS — I130 Hypertensive heart and chronic kidney disease with heart failure and stage 1 through stage 4 chronic kidney disease, or unspecified chronic kidney disease: Secondary | ICD-10-CM | POA: Diagnosis present

## 2018-01-15 DIAGNOSIS — Z888 Allergy status to other drugs, medicaments and biological substances status: Secondary | ICD-10-CM | POA: Diagnosis not present

## 2018-01-15 DIAGNOSIS — Z9049 Acquired absence of other specified parts of digestive tract: Secondary | ICD-10-CM

## 2018-01-15 DIAGNOSIS — Z881 Allergy status to other antibiotic agents status: Secondary | ICD-10-CM | POA: Diagnosis not present

## 2018-01-15 DIAGNOSIS — I482 Chronic atrial fibrillation: Secondary | ICD-10-CM | POA: Diagnosis present

## 2018-01-15 DIAGNOSIS — J9601 Acute respiratory failure with hypoxia: Secondary | ICD-10-CM | POA: Diagnosis not present

## 2018-01-15 DIAGNOSIS — Z9841 Cataract extraction status, right eye: Secondary | ICD-10-CM

## 2018-01-15 DIAGNOSIS — R059 Cough, unspecified: Secondary | ICD-10-CM

## 2018-01-15 DIAGNOSIS — Z90710 Acquired absence of both cervix and uterus: Secondary | ICD-10-CM

## 2018-01-15 DIAGNOSIS — I255 Ischemic cardiomyopathy: Secondary | ICD-10-CM | POA: Diagnosis present

## 2018-01-15 DIAGNOSIS — I509 Heart failure, unspecified: Secondary | ICD-10-CM

## 2018-01-15 DIAGNOSIS — D500 Iron deficiency anemia secondary to blood loss (chronic): Secondary | ICD-10-CM | POA: Diagnosis not present

## 2018-01-15 DIAGNOSIS — Z95810 Presence of automatic (implantable) cardiac defibrillator: Secondary | ICD-10-CM

## 2018-01-15 DIAGNOSIS — R0902 Hypoxemia: Secondary | ICD-10-CM | POA: Diagnosis present

## 2018-01-15 DIAGNOSIS — G4733 Obstructive sleep apnea (adult) (pediatric): Secondary | ICD-10-CM | POA: Diagnosis present

## 2018-01-15 DIAGNOSIS — I4819 Other persistent atrial fibrillation: Secondary | ICD-10-CM | POA: Diagnosis present

## 2018-01-15 DIAGNOSIS — Z515 Encounter for palliative care: Secondary | ICD-10-CM | POA: Diagnosis not present

## 2018-01-15 DIAGNOSIS — K219 Gastro-esophageal reflux disease without esophagitis: Secondary | ICD-10-CM | POA: Diagnosis present

## 2018-01-15 DIAGNOSIS — Z91040 Latex allergy status: Secondary | ICD-10-CM | POA: Diagnosis not present

## 2018-01-15 DIAGNOSIS — R05 Cough: Secondary | ICD-10-CM

## 2018-01-15 DIAGNOSIS — D509 Iron deficiency anemia, unspecified: Secondary | ICD-10-CM | POA: Diagnosis present

## 2018-01-15 DIAGNOSIS — N179 Acute kidney failure, unspecified: Secondary | ICD-10-CM | POA: Diagnosis present

## 2018-01-15 DIAGNOSIS — Z7189 Other specified counseling: Secondary | ICD-10-CM | POA: Diagnosis not present

## 2018-01-15 DIAGNOSIS — Z87891 Personal history of nicotine dependence: Secondary | ICD-10-CM

## 2018-01-15 DIAGNOSIS — I252 Old myocardial infarction: Secondary | ICD-10-CM | POA: Diagnosis not present

## 2018-01-15 DIAGNOSIS — I248 Other forms of acute ischemic heart disease: Secondary | ICD-10-CM | POA: Diagnosis present

## 2018-01-15 DIAGNOSIS — Z9981 Dependence on supplemental oxygen: Secondary | ICD-10-CM

## 2018-01-15 DIAGNOSIS — Z9111 Patient's noncompliance with dietary regimen: Secondary | ICD-10-CM

## 2018-01-15 DIAGNOSIS — S90512A Abrasion, left ankle, initial encounter: Secondary | ICD-10-CM | POA: Diagnosis present

## 2018-01-15 DIAGNOSIS — E1122 Type 2 diabetes mellitus with diabetic chronic kidney disease: Secondary | ICD-10-CM | POA: Diagnosis present

## 2018-01-15 DIAGNOSIS — I5033 Acute on chronic diastolic (congestive) heart failure: Secondary | ICD-10-CM | POA: Diagnosis not present

## 2018-01-15 DIAGNOSIS — I481 Persistent atrial fibrillation: Secondary | ICD-10-CM | POA: Diagnosis present

## 2018-01-15 DIAGNOSIS — Z833 Family history of diabetes mellitus: Secondary | ICD-10-CM

## 2018-01-15 DIAGNOSIS — Z961 Presence of intraocular lens: Secondary | ICD-10-CM | POA: Diagnosis present

## 2018-01-15 DIAGNOSIS — Z8249 Family history of ischemic heart disease and other diseases of the circulatory system: Secondary | ICD-10-CM

## 2018-01-15 DIAGNOSIS — R011 Cardiac murmur, unspecified: Secondary | ICD-10-CM | POA: Diagnosis present

## 2018-01-15 DIAGNOSIS — Z8701 Personal history of pneumonia (recurrent): Secondary | ICD-10-CM

## 2018-01-15 DIAGNOSIS — Z923 Personal history of irradiation: Secondary | ICD-10-CM

## 2018-01-15 DIAGNOSIS — I340 Nonrheumatic mitral (valve) insufficiency: Secondary | ICD-10-CM | POA: Diagnosis present

## 2018-01-15 DIAGNOSIS — Z5329 Procedure and treatment not carried out because of patient's decision for other reasons: Secondary | ICD-10-CM | POA: Diagnosis present

## 2018-01-15 DIAGNOSIS — J9690 Respiratory failure, unspecified, unspecified whether with hypoxia or hypercapnia: Secondary | ICD-10-CM | POA: Diagnosis present

## 2018-01-15 DIAGNOSIS — Z9842 Cataract extraction status, left eye: Secondary | ICD-10-CM

## 2018-01-15 DIAGNOSIS — M810 Age-related osteoporosis without current pathological fracture: Secondary | ICD-10-CM | POA: Diagnosis present

## 2018-01-15 DIAGNOSIS — J9600 Acute respiratory failure, unspecified whether with hypoxia or hypercapnia: Secondary | ICD-10-CM

## 2018-01-15 DIAGNOSIS — N189 Chronic kidney disease, unspecified: Secondary | ICD-10-CM | POA: Diagnosis present

## 2018-01-15 DIAGNOSIS — M1990 Unspecified osteoarthritis, unspecified site: Secondary | ICD-10-CM | POA: Diagnosis present

## 2018-01-15 DIAGNOSIS — R51 Headache: Secondary | ICD-10-CM | POA: Diagnosis not present

## 2018-01-15 HISTORY — DX: Anemia in other chronic diseases classified elsewhere: D63.8

## 2018-01-15 LAB — COMPREHENSIVE METABOLIC PANEL
ALBUMIN: 3.7 g/dL (ref 3.5–5.0)
ALK PHOS: 45 U/L (ref 38–126)
ALT: 14 U/L (ref 14–54)
ANION GAP: 16 — AB (ref 5–15)
AST: 33 U/L (ref 15–41)
BILIRUBIN TOTAL: 1.8 mg/dL — AB (ref 0.3–1.2)
BUN: 45 mg/dL — AB (ref 6–20)
CO2: 33 mmol/L — AB (ref 22–32)
Calcium: 8.8 mg/dL — ABNORMAL LOW (ref 8.9–10.3)
Chloride: 89 mmol/L — ABNORMAL LOW (ref 101–111)
Creatinine, Ser: 2.12 mg/dL — ABNORMAL HIGH (ref 0.44–1.00)
GFR calc Af Amer: 24 mL/min — ABNORMAL LOW (ref >60)
GFR calc non Af Amer: 21 mL/min — ABNORMAL LOW (ref >60)
GLUCOSE: 203 mg/dL — AB (ref 65–99)
POTASSIUM: 3.5 mmol/L (ref 3.5–5.1)
SODIUM: 138 mmol/L (ref 135–145)
TOTAL PROTEIN: 7.4 g/dL (ref 6.5–8.1)

## 2018-01-15 LAB — CBC WITH DIFFERENTIAL/PLATELET
BASOS ABS: 0 10*3/uL (ref 0–0.1)
BASOS PCT: 0 %
Eosinophils Absolute: 0 10*3/uL (ref 0–0.7)
Eosinophils Relative: 0 %
HCT: 27.4 % — ABNORMAL LOW (ref 35.0–47.0)
HEMOGLOBIN: 9.1 g/dL — AB (ref 12.0–16.0)
LYMPHS PCT: 2 %
Lymphs Abs: 0.2 10*3/uL — ABNORMAL LOW (ref 1.0–3.6)
MCH: 28.6 pg (ref 26.0–34.0)
MCHC: 33.2 g/dL (ref 32.0–36.0)
MCV: 86.1 fL (ref 80.0–100.0)
MONO ABS: 0.5 10*3/uL (ref 0.2–0.9)
Monocytes Relative: 5 %
NEUTROS ABS: 8.8 10*3/uL — AB (ref 1.4–6.5)
NEUTROS PCT: 93 %
Platelets: 143 10*3/uL — ABNORMAL LOW (ref 150–440)
RBC: 3.18 MIL/uL — AB (ref 3.80–5.20)
RDW: 16.4 % — ABNORMAL HIGH (ref 11.5–14.5)
WBC: 9.6 10*3/uL (ref 3.6–11.0)

## 2018-01-15 LAB — TSH: TSH: 1.418 u[IU]/mL (ref 0.350–4.500)

## 2018-01-15 LAB — TROPONIN I
TROPONIN I: 0.15 ng/mL — AB (ref <0.03)
Troponin I: 0.13 ng/mL (ref <0.03)
Troponin I: 0.14 ng/mL (ref <0.03)
Troponin I: 0.16 ng/mL (ref <0.03)

## 2018-01-15 LAB — LACTIC ACID, PLASMA
LACTIC ACID, VENOUS: 1.3 mmol/L (ref 0.5–1.9)
LACTIC ACID, VENOUS: 1.7 mmol/L (ref 0.5–1.9)

## 2018-01-15 LAB — PROCALCITONIN: PROCALCITONIN: 6.05 ng/mL

## 2018-01-15 LAB — BRAIN NATRIURETIC PEPTIDE: B Natriuretic Peptide: 1235 pg/mL — ABNORMAL HIGH (ref 0.0–100.0)

## 2018-01-15 MED ORDER — IBUPROFEN 400 MG PO TABS
400.0000 mg | ORAL_TABLET | Freq: Four times a day (QID) | ORAL | Status: DC | PRN
  Administered 2018-01-18 – 2018-01-19 (×2): 400 mg via ORAL
  Filled 2018-01-15 (×2): qty 1

## 2018-01-15 MED ORDER — FENOFIBRATE 54 MG PO TABS
54.0000 mg | ORAL_TABLET | Freq: Every day | ORAL | Status: DC
  Administered 2018-01-15 – 2018-01-22 (×8): 54 mg via ORAL
  Filled 2018-01-15 (×11): qty 1

## 2018-01-15 MED ORDER — SODIUM CHLORIDE 0.9 % IV SOLN
500.0000 mg | Freq: Once | INTRAVENOUS | Status: DC
  Administered 2018-01-15: 500 mg via INTRAVENOUS
  Filled 2018-01-15: qty 500

## 2018-01-15 MED ORDER — LEVALBUTEROL HCL 1.25 MG/0.5ML IN NEBU
1.2500 mg | INHALATION_SOLUTION | Freq: Four times a day (QID) | RESPIRATORY_TRACT | Status: DC | PRN
  Administered 2018-01-15 – 2018-01-21 (×5): 1.25 mg via RESPIRATORY_TRACT
  Filled 2018-01-15 (×7): qty 0.5

## 2018-01-15 MED ORDER — FUROSEMIDE 10 MG/ML IJ SOLN
60.0000 mg | Freq: Once | INTRAMUSCULAR | Status: AC
  Administered 2018-01-15: 60 mg via INTRAVENOUS
  Filled 2018-01-15: qty 8

## 2018-01-15 MED ORDER — ONDANSETRON HCL 4 MG PO TABS
4.0000 mg | ORAL_TABLET | Freq: Four times a day (QID) | ORAL | Status: DC | PRN
  Administered 2018-01-21: 4 mg via ORAL
  Filled 2018-01-15: qty 1

## 2018-01-15 MED ORDER — ACETAMINOPHEN 650 MG RE SUPP: 650.0000 mg | Freq: Four times a day (QID) | RECTAL | Status: DC | PRN

## 2018-01-15 MED ORDER — ENOXAPARIN SODIUM 30 MG/0.3ML ~~LOC~~ SOLN: 30.0000 mg | SUBCUTANEOUS | Status: DC

## 2018-01-15 MED ORDER — SODIUM CHLORIDE 0.9 % IV SOLN: 250.0000 mL | INTRAVENOUS | Status: DC | PRN

## 2018-01-15 MED ORDER — SODIUM CHLORIDE 0.9% FLUSH
3.0000 mL | INTRAVENOUS | Status: DC | PRN
  Administered 2018-01-20: 3 mL via INTRAVENOUS
  Filled 2018-01-15: qty 3

## 2018-01-15 MED ORDER — ONDANSETRON HCL 4 MG/2ML IJ SOLN
4.0000 mg | Freq: Four times a day (QID) | INTRAMUSCULAR | Status: DC | PRN
Start: 2018-01-15 — End: 2018-01-22
  Administered 2018-01-20 – 2018-01-22 (×3): 4 mg via INTRAVENOUS
  Filled 2018-01-15 (×3): qty 2

## 2018-01-15 MED ORDER — POLYETHYLENE GLYCOL 3350 17 G PO PACK: 17.0000 g | PACK | Freq: Every day | ORAL | Status: DC | PRN

## 2018-01-15 MED ORDER — PANTOPRAZOLE SODIUM 40 MG PO TBEC: 40.0000 mg | DELAYED_RELEASE_TABLET | Freq: Every day | ORAL | Status: DC | PRN

## 2018-01-15 MED ORDER — BUSPIRONE HCL 15 MG PO TABS
7.5000 mg | ORAL_TABLET | Freq: Two times a day (BID) | ORAL | Status: DC
  Administered 2018-01-15 – 2018-01-19 (×9): 7.5 mg via ORAL
  Filled 2018-01-15 (×14): qty 1

## 2018-01-15 MED ORDER — TIOTROPIUM BROMIDE MONOHYDRATE 18 MCG IN CAPS
18.0000 ug | ORAL_CAPSULE | Freq: Every day | RESPIRATORY_TRACT | Status: DC | PRN
  Filled 2018-01-15: qty 5

## 2018-01-15 MED ORDER — FUROSEMIDE 10 MG/ML IJ SOLN
60.0000 mg | Freq: Every day | INTRAMUSCULAR | Status: DC
  Filled 2018-01-15: qty 6

## 2018-01-15 MED ORDER — SODIUM CHLORIDE 0.9 % IV SOLN
1.0000 g | Freq: Three times a day (TID) | INTRAVENOUS | Status: DC
  Administered 2018-01-15 (×2): 1 g via INTRAVENOUS
  Filled 2018-01-15 (×4): qty 1

## 2018-01-15 MED ORDER — SERTRALINE HCL 50 MG PO TABS
50.0000 mg | ORAL_TABLET | Freq: Every day | ORAL | Status: DC
  Administered 2018-01-15 – 2018-01-22 (×7): 50 mg via ORAL
  Filled 2018-01-15 (×8): qty 1

## 2018-01-15 MED ORDER — SODIUM CHLORIDE 0.9 % IV SOLN
500.0000 mg | Freq: Two times a day (BID) | INTRAVENOUS | Status: DC
  Filled 2018-01-15 (×2): qty 0.5

## 2018-01-15 MED ORDER — SODIUM CHLORIDE 0.9 % IV SOLN
500.0000 mg | Freq: Every day | INTRAVENOUS | Status: DC
  Filled 2018-01-15: qty 500

## 2018-01-15 MED ORDER — ENOXAPARIN SODIUM 40 MG/0.4ML ~~LOC~~ SOLN
40.0000 mg | SUBCUTANEOUS | Status: DC
  Administered 2018-01-15: 40 mg via SUBCUTANEOUS
  Filled 2018-01-15: qty 0.4

## 2018-01-15 MED ORDER — FUROSEMIDE 10 MG/ML IJ SOLN
40.0000 mg | Freq: Once | INTRAMUSCULAR | Status: AC
  Administered 2018-01-15: 40 mg via INTRAVENOUS
  Filled 2018-01-15: qty 4

## 2018-01-15 MED ORDER — ALPRAZOLAM 0.5 MG PO TABS
0.5000 mg | ORAL_TABLET | Freq: Every day | ORAL | Status: DC
  Administered 2018-01-15 – 2018-01-21 (×7): 0.5 mg via ORAL
  Filled 2018-01-15 (×7): qty 1

## 2018-01-15 MED ORDER — FLUTICASONE FUROATE-VILANTEROL 100-25 MCG/INH IN AEPB
1.0000 | INHALATION_SPRAY | Freq: Every day | RESPIRATORY_TRACT | Status: DC | PRN
  Administered 2018-01-15: 1 via RESPIRATORY_TRACT
  Filled 2018-01-15: qty 28

## 2018-01-15 MED ORDER — BISACODYL 10 MG RE SUPP
10.0000 mg | Freq: Every day | RECTAL | Status: DC | PRN
  Administered 2018-01-22: 10 mg via RECTAL
  Filled 2018-01-15: qty 1

## 2018-01-15 MED ORDER — ACETAMINOPHEN 325 MG PO TABS
650.0000 mg | ORAL_TABLET | Freq: Four times a day (QID) | ORAL | Status: DC | PRN
  Administered 2018-01-19 – 2018-01-20 (×2): 650 mg via ORAL
  Filled 2018-01-15 (×3): qty 2

## 2018-01-15 MED ORDER — NITROGLYCERIN 0.4 MG SL SUBL: 0.4000 mg | SUBLINGUAL_TABLET | SUBLINGUAL | Status: DC | PRN

## 2018-01-15 MED ORDER — ATORVASTATIN CALCIUM 20 MG PO TABS
20.0000 mg | ORAL_TABLET | Freq: Every day | ORAL | Status: DC
  Administered 2018-01-15 – 2018-01-22 (×8): 20 mg via ORAL
  Filled 2018-01-15 (×8): qty 1

## 2018-01-15 MED ORDER — SODIUM CHLORIDE 0.9 % IV SOLN: 500.0000 mg | Freq: Two times a day (BID) | INTRAVENOUS | Status: DC

## 2018-01-15 MED ORDER — METOPROLOL SUCCINATE ER 25 MG PO TB24
25.0000 mg | ORAL_TABLET | Freq: Every day | ORAL | Status: DC
  Administered 2018-01-15 – 2018-01-22 (×8): 25 mg via ORAL
  Filled 2018-01-15 (×8): qty 1

## 2018-01-15 MED ORDER — FUROSEMIDE 10 MG/ML IJ SOLN: 40.0000 mg | Freq: Every day | INTRAMUSCULAR | Status: DC

## 2018-01-15 MED ORDER — SODIUM CHLORIDE 0.9% FLUSH
3.0000 mL | Freq: Two times a day (BID) | INTRAVENOUS | Status: DC
  Administered 2018-01-15 – 2018-01-22 (×15): 3 mL via INTRAVENOUS

## 2018-01-15 NOTE — Progress Notes (Signed)
Text page Dr. Benjie Karvonen about patient's c/o of SOB, patient is allergic to albuterol, order for Xoponex given. RN will continue to monitor.

## 2018-01-15 NOTE — ED Notes (Signed)
Patient transported to X-ray 

## 2018-01-15 NOTE — ED Provider Notes (Addendum)
[facility] Emergency Department Provider Note   ____________________________________________   First MD Initiated Contact with Patient 01/15/18 1052     (approximate)  I have reviewed the triage vital signs and the nursing notes.   HISTORY  Chief Complaint Respiratory Distress  Chief complaint shortness of breath  HPI Regina Burke is a 84 y.o. female who reports she has had shortness of breath increasing for the last 2-3 days. She has a cough productive of brown phlegm and is not feeling well. She was on hospice for 6 months and just recently got off. She has a history of COPD. His history of multiple allergies to Cipro Doxy and penicillins and sulfa and gets tachycardic with albuterol. She had blisters in the mouth with cefuroxime.   Past Medical History:  Diagnosis Date  . Anemia   . Arthritis   . Asthma   . Cardiac resynchronization therapy pacemaker (CRT-P) in place    a. 03/31/16:  Medtronic Percepta Quad CRT-P MRI SureScan (serial Number [id number]) device.  . Chronic diastolic CHF (congestive heart failure) ([place])    a. 10/2015 Echo: EF 55-65%, Gr1 DD, mild MR, mildly dil LA, nl RV fxn, nl PASP;  b. 03/2016 Echo: EF 30-35%, mod MR, mod dil LA; c. 12/2016 Echo: EF 60-65%, no rwma, mild AS, mildly dil LA, PASP 32mmHg.  . CKD (chronic kidney disease), stage IV ([place])   . COPD (chronic obstructive pulmonary disease) ([place])   . Coronary artery disease    a. 11/2014 NSTEMI/PCI: LM nl, LAD 59m, D1 30, LCX mild dzs, OM1 20p, OM2 70m, OM3 90p (2.25x8 Promus Premier DES), RCA nl.   . Cough    CHRONIC AT NIGHT  . Cushing's disease ([place])   . Depression   . Diverticulitis   . Edema    FEET/LEGS  . GERD (gastroesophageal reflux disease)   . Gout   . History of hiatal hernia   . History of pneumonia   . HLD (hyperlipidemia)   . HOH (hard of hearing)   . Hypertensive heart disease   . Lung cancer ([place]) dx'd 2014   S/P radiation 2015  . Migraine   .  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 03/2016 Echo: EF 30-35%;  b. 12/2016 Echo: EF 60-65%.  . Moderate mitral regurgitation   . Multiple allergies   . Myocardial infarction ([place])   . Oxygen deficiency    2LITERS  . Persistent atrial fibrillation ([place])    a. CHADS2VASc ==> 7 (CHF, HTN, age x 2, DM, vascular disease, and gender)--was on renal dosed eliquis but this was d/c'd in 12/2016 in setting of dark stools/anemia;  c. 03/2016 s/p AVN and MDT BiV ICD placement.  . Presence of permanent cardiac pacemaker   . S/P AV nodal ablation    a. on 03/31/16 for persistant afib with CRT-P placement  . Sleep apnea   . Type II diabetes mellitus [facility])     Patient Active Problem List   Diagnosis Date Noted  . Respiratory failure ([place]) 01/15/2018  . Acute on chronic heart failure ([place])   . Goals of care, counseling/discussion   . Advance care planning   . Pneumonia 02/27/2017  . S/p left hip fracture 01/31/2017  . Pre-operative cardiovascular examination 01/31/2017  . GI bleed   . Other diseases of stomach and duodenum   . CHF, acute on chronic ([place]) 01/23/2017  . Chest pain 01/12/2017  . Dizziness 08/25/2016  . LBBB (left  bundle branch block) 04/01/2016  . S/P AV nodal ablation   . Cardiac resynchronization therapy pacemaker (CRT-P) in place   . Persistent atrial fibrillation ([place]): CHA2DS2-Vasc = ~7. On Xarelto 15 mg (age & renal Fxn) 03/18/2016  . Congestive dilated cardiomyopathy ([place]) 03/18/2016    Class: Temporary  . DNR (do not resuscitate) discussion   . Palliative care encounter   . Persistent atrial fibrillation ([place])   . Chronic diastolic CHF (congestive heart failure) ([place]) 02/16/2016  . HLD (hyperlipidemia) 02/16/2016  . Iron deficiency anemia due to chronic blood loss   . Essential hypertension   . Dyspnea 11/25/2015  . Diaphoresis   . Renal insufficiency   . Centrilobular emphysema ([place])   . Chronic obstructive pulmonary disease ([place]) 07/14/2015  .  Status post thoracentesis   . S/P thoracentesis   . H/O: lung cancer   . Pleural effusion   . Coronary artery disease involving native coronary artery of native heart without angina pectoris   . S/P coronary artery stent placement   . Pressure ulcer 06/08/2015  . Dehydration 05/09/2015  . Diabetes ([place]) 05/09/2015  . Closed fracture nasal bone 05/09/2015  . Cancer of upper lobe of right lung ([place]) 01/08/2015  . Allergic state 01/08/2015  . Anemia associated with chronic renal failure 01/08/2015  . Chronic kidney disease 01/08/2015  . CAFL (chronic airflow limitation) ([place]) 01/08/2015  . Arthritis, degenerative 01/08/2015  . 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 Diabetes mellitus, type 2 ([place]) 09/11/2014  . Type 2 diabetes mellitus ([place]) 09/11/2014    Past Surgical History:  Procedure Laterality Date  . ABDOMINAL HYSTERECTOMY    . ABLATION     July 2017  . ADRENALECTOMY Left 1980's   "[place]"  . APPENDECTOMY    . BREAST CYST EXCISION Left   . CATARACT EXTRACTION W/PHACO Right 12/28/2015   Procedure: CATARACT EXTRACTION PHACO AND INTRAOCULAR LENS PLACEMENT (IOC);  Surgeon: Birder Robson, MD;  Location: [facility] ORS;  Service: Ophthalmology;  Laterality: Right;  [place] 48.4   . CATARACT EXTRACTION W/PHACO Left 11/14/2016   Procedure: CATARACT EXTRACTION PHACO AND INTRAOCULAR LENS PLACEMENT (IOC);  Surgeon: Birder Robson, MD;  Location: [facility] ORS;  Service: Ophthalmology;  Laterality: Left;  [place] 59.8 AP% 18.1 CDE 10.78 Fluid pack lot # [id number]  . CHOLECYSTECTOMY    . CORONARY ANGIOPLASTY WITH STENT PLACEMENT  12/23/2014  . ELECTROPHYSIOLOGIC STUDY N/A 03/08/2016   Procedure: CARDIOVERSION;  Surgeon: Wende Bushy, MD;  Location: [facility] ORS;  Service: Cardiovascular;  Laterality: N/A;  . ELECTROPHYSIOLOGIC STUDY N/A 03/07/2016   Procedure: Cardioversion;  Surgeon: Wende Bushy, MD;  Location: [facility] ORS;  Service: Cardiovascular;  Laterality: N/A;  . ELECTROPHYSIOLOGIC STUDY N/A  03/31/2016   Procedure: AV Node Ablation;  Surgeon: Will Meredith Leeds, MD;  Location: [place] CV LAB;  Service: Cardiovascular;  Laterality: N/A;  . EP IMPLANTABLE DEVICE N/A 03/31/2016   Procedure: BiV Pacemaker Insertion CRT-P;  Surgeon: Will Meredith Leeds, MD;  Location: [place] CV LAB;  Service: Cardiovascular;  Laterality: N/A;  . ESOPHAGOGASTRODUODENOSCOPY (EGD) WITH PROPOFOL N/A 01/26/2017   Procedure: ESOPHAGOGASTRODUODENOSCOPY (EGD) WITH PROPOFOL;  Surgeon: [name], MD;  Location: [facility] ENDOSCOPY;  Service: Endoscopy;  Laterality: N/A;  . FRACTURE SURGERY    . HIP PINNING,CANNULATED Left 02/01/2017   Procedure: CANNULATED HIP PINNING;  Surgeon: Lovell Sheehan, MD;  Location: [facility] ORS;  Service: Orthopedics;  Laterality: Left;  . INSERT / REPLACE / REMOVE PACEMAKER  02/2016  . LEFT HEART CATHETERIZATION WITH CORONARY ANGIOGRAM N/A  12/23/2014   Procedure: LEFT HEART CATHETERIZATION WITH CORONARY ANGIOGRAM;  Surgeon: Burnell Blanks, MD;  Location: [facility] CATH LAB;  Service: Cardiovascular;  Laterality: N/A;  . PERCUTANEOUS CORONARY STENT INTERVENTION (PCI-S)  12/23/2014   Procedure: PERCUTANEOUS CORONARY STENT INTERVENTION (PCI-S);  Surgeon: Burnell Blanks, MD;  Location: [facility] CATH LAB;  Service: Cardiovascular;;  Promus 2.25x8  . TONSILLECTOMY    . TRANSTHORACIC ECHOCARDIOGRAM  11/26/2015   Technically difficult study. EF 55-60%. Normal wall motion. GR 1 DD.  . TUBAL LIGATION    . WRIST FRACTURE SURGERY Bilateral ~ 2000    Prior to Admission medications   Medication Sig Start Date End Date Taking? Authorizing Provider  ALPRAZolam Duanne Moron) 0.5 MG tablet Take 0.5 mg by mouth at bedtime. 12/27/16  Yes [provider]  atorvastatin (LIPITOR) 20 MG tablet Take 20 mg by mouth daily.   Yes [provider]  busPIRone (BUSPAR) 7.5 MG tablet Take 7.5 mg by mouth 2 (two) times daily.  03/13/17 03/13/18 Yes [provider]  feeding supplement, ENSURE ENLIVE,  (ENSURE ENLIVE) LIQD Take 237 mLs by mouth 3 (three) times daily with meals. 03/02/17  Yes Fritzi Mandes, MD  fenofibrate (TRICOR) 48 MG tablet Take 48 mg by mouth every evening.    Yes [provider]  fluticasone furoate-vilanterol (BREO ELLIPTA) 100-25 MCG/INH AEPB Inhale 1 puff into the lungs daily as needed (shortness of breath).    Yes [provider]  metoprolol succinate (TOPROL XL) 25 MG 24 hr tablet Take 25 mg by mouth daily.  12/19/16  Yes Minna Merritts, MD  morphine 10 MG/5ML solution Take 5 mg by mouth daily as needed for severe pain.   Yes [provider]  nitroGLYCERIN (NITROSTAT) 0.4 MG SL tablet Place 1 tablet (0.4 mg total) under the tongue every 5 (five) minutes as needed for chest pain. 12/24/14  Yes Barrett, Evelene Croon, PA-C  pantoprazole (PROTONIX) 40 MG tablet Take 40 mg by mouth daily as needed (heartburn).    Yes [provider]  sertraline (ZOLOFT) 50 MG tablet Take 1 tablet (50 mg total) by mouth daily. 03/24/16  Yes Gladstone Lighter, MD  tiotropium (SPIRIVA) 18 MCG inhalation capsule Place 18 mcg into inhaler and inhale daily as needed (shortness of breath).    Yes [provider]  torsemide (DEMADEX) 20 MG tablet Take 2 tablets (40 mg total) by mouth daily. Patient taking differently: Take 30-40 mg by mouth daily. Dose depending upon fluid retention. 03/02/17  Yes Fritzi Mandes, MD  levalbuterol Penne Lash) 1.25 MG/0.5ML nebulizer solution Take 1.25 mg by nebulization every 6 (six) hours as needed for wheezing or shortness of breath. Patient not taking: Reported on 01/15/2018 03/02/17   Fritzi Mandes, MD    Allergies Ciprofloxacin; Doxycycline; Penicillins; Sulfa antibiotics; Latex; Morphine and related; Albuterol sulfate; Macrobid [nitrofurantoin macrocrystal]; and Cefuroxime  Family History  Problem Relation Age of Onset  . Heart disease Mother   . Diabetes Mother   . Osteoarthritis Mother   . Hypertension Mother   . Heart disease  Father   . Hypertension Father   . COPD Brother     Social History Social History   Tobacco Use  . Smoking status: Former Smoker    Packs/day: 1.00    Years: 45.00    Pack years: 45.00    Types: Cigarettes    Last attempt to quit: 04/25/1994    Years since quitting: 23.7  . Smokeless tobacco: Never Used  Substance Use Topics  .  Alcohol use: No    Comment: 12/23/2014 "might have a couple mixed drinks/year"  . Drug use: No    Review of Systems  Constitutional:low-grade fever Eyes: No visual changes. ENT: No sore throat. Cardiovascular: Denies chest pain. Respiratory:  shortness of breath. Gastrointestinal: No abdominal pain.  No nausea, no vomiting.  No diarrhea.  No constipation. Genitourinary: Negative for dysuria. Musculoskeletal: Negative for back pain. Skin: Negative for rash. Neurological: Negative for headaches, focal weakness  ____________________________________________   PHYSICAL EXAM:  VITAL SIGNS: ED Triage Vitals  Enc Vitals Group     BP      Pulse      Resp      Temp      Temp src      SpO2      Weight      Height      Head Circumference      Peak Flow      Pain Score      Pain Loc      Pain Edu?      Excl. in [place]?    Constitutional: Alert and oriented. Well appearing and in no acute distress. Eyes: Conjunctivae are normal.  Head: Atraumatic. Nose: No congestion/rhinnorhea. Mouth/Throat: Mucous membranes are moist.  Oropharynx non-erythematous. Neck: No stridor.  Cardiovascular: Normal rate, regular rhythm. Grossly normal heart sounds.  Good peripheral circulation. Respiratory: Normal respiratory effort.  No retractions. Lungs scattered crackles Gastrointestinal: Soft and nontender. No distention. No abdominal bruits. No CVA tenderness. Musculoskeletal: No lower extremity tenderness and some edema.  No joint effusions. Neurologic:  Normal speech and language. No gross focal neurologic deficits are appreciated.  Skin:  Skin is warm, dry and  intact. No rash noted. Psychiatric: Mood and affect are normal. Speech and behavior are normal.  ____________________________________________   LABS (all labs ordered are listed, but only abnormal results are displayed)  Labs Reviewed  COMPREHENSIVE METABOLIC PANEL - Abnormal; Notable for the following components:      Result Value   Chloride 89 (*)    CO2 33 (*)    Glucose, Bld 203 (*)    BUN 45 (*)    Creatinine, Ser 2.12 (*)    Calcium 8.8 (*)    Total Bilirubin 1.8 (*)    GFR calc non Af Amer 21 (*)    GFR calc Af Amer 24 (*)    Anion gap 16 (*)    All other components within normal limits  BRAIN NATRIURETIC PEPTIDE - Abnormal; Notable for the following components:   B Natriuretic Peptide 1,235.0 (*)    All other components within normal limits  TROPONIN I - Abnormal; Notable for the following components:   Troponin I 0.13 (*)    All other components within normal limits  CBC WITH DIFFERENTIAL/PLATELET - Abnormal; Notable for the following components:   RBC 3.18 (*)    Hemoglobin 9.1 (*)    HCT 27.4 (*)    RDW 16.4 (*)    Platelets 143 (*)    Neutro Abs 8.8 (*)    Lymphs Abs 0.2 (*)    All other components within normal limits  URINE CULTURE  CULTURE, BLOOD (ROUTINE X 2)  CULTURE, BLOOD (ROUTINE X 2)  LACTIC ACID, PLASMA  LACTIC ACID, PLASMA  URINALYSIS, COMPLETE (UACMP) WITH MICROSCOPIC  TROPONIN I  TROPONIN I  TROPONIN I   ____________________________________________  EKG EKG read and interpreted by me shows a flutter at a rate of 75 left axis there R pacing spikes  present on every QRS EKG was other was unremarkable  ____________________________________________  RADIOLOGY  ED MD interpretation:    Official radiology report(s): Dg Chest 2 View  Result Date: 01/15/2018 CLINICAL DATA:  Shortness of breath. EXAM: CHEST - 2 VIEW COMPARISON:  02/27/2017. FINDINGS: AICD noted stable position. Cardiomegaly with diffuse bilateral from interstitial prominence  and left-sided pleural effusion. Findings consistent with CHF. Atelectatic changes noted left lung base. No pneumothorax. IMPRESSION: AICD in stable position. Cardiomegaly with diffuse bilateral pulmonary infiltrates/edema and left-sided pleural effusion. Findings most consistent CHF. Electronically Signed   By: Marcello Moores  Register   On: 01/15/2018 11:35    ____________________________________________   PROCEDURES  Procedure(s) performed:   Procedures  Critical Care performed:  critical care time 15 minutes a good portion of this was spent evaluating the patient and trying to figure out what antibiotic to give her. ____________________________________________   INITIAL IMPRESSION / ASSESSMENT AND PLAN / ED COURSE  patient was given IV Lasix and put on broad-spectrum antibiotics. It took some time to determine what we could use.         ____________________________________________   FINAL CLINICAL IMPRESSION(S) / ED DIAGNOSES  Final diagnoses:  Hypoxia  Chronic congestive heart failure, unspecified heart failure type ([place])  Healthcare-associated pneumonia   actual diagnosis is not healthcare associated pneumonia but possible healthcare associated pneumonia  ED Discharge Orders    None       Note:  This document was prepared using Dragon voice recognition software and may include unintentional dictation errors.    Nena Polio, MD 01/15/18 1459    Nena Polio, MD 01/25/18 [phone]

## 2018-01-15 NOTE — ED Triage Notes (Signed)
Pt to ED via [facility] with c/o SOB and productive cough. Pt wears O2 at home she is 93% on 4L [place]. She reports that she developed the SOB yesterday but has been feeling sick the last week. She is able to speak in complete sentences.

## 2018-01-15 NOTE — Progress Notes (Signed)
Talked to Dr. Jannifer Franklin about patient's increase use of accessory muscle when breathing, has rhonchi in bilateral lungs. SpO2 was 93% in 3L, respiration was 24. She received 60 mg of Lasix in the ED and had 300 ml of urine output. Bladder scanned patient and see if she is retaining but was 0. Order to give another 40 IV of lasix. Will also give breathing treatment. Chest xray is order in the morning. No other concern at the moment. RN will continue to monitor.

## 2018-01-15 NOTE — Progress Notes (Signed)
Family Meeting Note  Advance Directive:yes  Today a meeting took place with the Patient.and POA, SON    The following clinical team members were present during this meeting:MD  The following were discussed:Patient's diagnosis acute on chronic hypoxic respiratory failure with acute on chronic diastolic heart failure pneumonia: , Patient's progosis: Unable to determine and Goals for treatment: DNR  Additional follow-up to be provided: Patient has updated advanced directives  Time spent during discussion: 16 minutes  Kiera Hussey, MD

## 2018-01-15 NOTE — Progress Notes (Signed)
CRITICAL VALUE ALERT  Critical Value:  Troponin 0.16  Date & Time Notied:  5/21/209  Provider Notified: Dr. Jannifer Franklin  Orders Received/Actions taken: serial troponin, no chest pain.

## 2018-01-15 NOTE — H&P (Signed)
[facility] NAME: Regina Burke    MR#:  [id number]  DATE OF BIRTH:  1933-06-29  DATE OF ADMISSION:  01/15/2018  PRIMARY CARE PHYSICIAN: Tracie Harrier, MD   REQUESTING/REFERRING PHYSICIAN: dr Cinda Quest  CHIEF COMPLAINT:   SOB HISTORY OF PRESENT ILLNESS:  Regina Burke  is a 84 y.o. female with a known history of chronic diastolic heart failure pEF, CAD and chronic hypoxic respiratory failure due to COPD who presents today to the emergency room due to shortness of breath.  Patient reports over the past 2 weeks she has had increasing shortness of breath, lower extremity edema, PND and orthopnea.  She also states she has had a productive cough.  She wears 2 L of oxygen at home today was increased to 3 L.  In the emergency room her troponin is 0.13, BNP 1235 and creatinine is 2.12. She was started on broad-spectrum antibiotics for pneumonia and given 60 mg IV Lasix.  PAST MEDICAL HISTORY:   Past Medical History:  Diagnosis Date  . Anemia   . Arthritis   . Asthma   . Cardiac resynchronization therapy pacemaker (CRT-P) in place    a. 03/31/16:  Medtronic Percepta Quad CRT-P MRI SureScan (serial Number [id number]) device.  . Chronic diastolic CHF (congestive heart failure) ([place])    a. 10/2015 Echo: EF 55-65%, Gr1 DD, mild MR, mildly dil LA, nl RV fxn, nl PASP;  b. 03/2016 Echo: EF 30-35%, mod MR, mod dil LA; c. 12/2016 Echo: EF 60-65%, no rwma, mild AS, mildly dil LA, PASP 78mmHg.  . CKD (chronic kidney disease), stage IV ([place])   . COPD (chronic obstructive pulmonary disease) ([place])   . Coronary artery disease    a. 11/2014 NSTEMI/PCI: LM nl, LAD 47m, D1 30, LCX mild dzs, OM1 20p, OM2 33m, OM3 90p (2.25x8 Promus Premier DES), RCA nl.   . Cough    CHRONIC AT NIGHT  . Cushing's disease ([place])   . Depression   . Diverticulitis   . Edema    FEET/LEGS  . GERD (gastroesophageal reflux disease)   . Gout   . History of hiatal hernia   .  History of pneumonia   . HLD (hyperlipidemia)   . HOH (hard of hearing)   . Hypertensive heart disease   . Lung cancer ([place]) dx'd 2014   S/P radiation 2015  . Migraine   . Mixed Ischemic and Nonischemic Cardiomyopathy ([place])    a. 03/2016 Echo: EF 30-35%;  b. 12/2016 Echo: EF 60-65%.  . Moderate mitral regurgitation   . Multiple allergies   . Myocardial infarction ([place])   . Oxygen deficiency    2LITERS  . Persistent atrial fibrillation ([place])    a. CHADS2VASc ==> 7 (CHF, HTN, age x 2, DM, vascular disease, and gender)--was on renal dosed eliquis but this was d/c'd in 12/2016 in setting of dark stools/anemia;  c. 03/2016 s/p AVN and MDT BiV ICD placement.  . Presence of permanent cardiac pacemaker   . S/P AV nodal ablation    a. on 03/31/16 for persistant afib with CRT-P placement  . Sleep apnea   . Type II diabetes mellitus ([place])     PAST SURGICAL HISTORY:   Past Surgical History:  Procedure Laterality Date  . ABDOMINAL HYSTERECTOMY    . ABLATION     July 2017  . ADRENALECTOMY Left 1980's   "[place]"  . APPENDECTOMY    . BREAST CYST EXCISION Left   .  CATARACT EXTRACTION W/PHACO Right 12/28/2015   Procedure: CATARACT EXTRACTION PHACO AND INTRAOCULAR LENS PLACEMENT (IOC);  Surgeon: Birder Robson, MD;  Location: [facility] ORS;  Service: Ophthalmology;  Laterality: Right;  [place] 48.4   . CATARACT EXTRACTION W/PHACO Left 11/14/2016   Procedure: CATARACT EXTRACTION PHACO AND INTRAOCULAR LENS PLACEMENT (IOC);  Surgeon: Birder Robson, MD;  Location: [facility] ORS;  Service: Ophthalmology;  Laterality: Left;  [place] 59.8 AP% 18.1 CDE 10.78 Fluid pack lot # [id number]  . CHOLECYSTECTOMY    . CORONARY ANGIOPLASTY WITH STENT PLACEMENT  12/23/2014  . ELECTROPHYSIOLOGIC STUDY N/A 03/08/2016   Procedure: CARDIOVERSION;  Surgeon: Wende Bushy, MD;  Location: [facility] ORS;  Service: Cardiovascular;  Laterality: N/A;  . ELECTROPHYSIOLOGIC STUDY N/A 03/07/2016   Procedure: Cardioversion;  Surgeon: Wende Bushy, MD;   Location: [facility] ORS;  Service: Cardiovascular;  Laterality: N/A;  . ELECTROPHYSIOLOGIC STUDY N/A 03/31/2016   Procedure: AV Node Ablation;  Surgeon: Will Meredith Leeds, MD;  Location: [place] CV [facility];  Service: Cardiovascular;  Laterality: N/A;  . EP IMPLANTABLE DEVICE N/A 03/31/2016   Procedure: BiV Pacemaker Insertion CRT-P;  Surgeon: Will Meredith Leeds, MD;  Location: [place] [facility];  Service: Cardiovascular;  Laterality: N/A;  . ESOPHAGOGASTRODUODENOSCOPY (EGD) WITH PROPOFOL N/A 01/26/2017   Procedure: ESOPHAGOGASTRODUODENOSCOPY (EGD) WITH PROPOFOL;  Surgeon: [name], MD;  Location: [facility] ENDOSCOPY;  Service: Endoscopy;  Laterality: N/A;  . FRACTURE SURGERY    . HIP PINNING,CANNULATED Left 02/01/2017   Procedure: CANNULATED HIP PINNING;  Surgeon: Lovell Sheehan, MD;  Location: [facility] ORS;  Service: Orthopedics;  Laterality: Left;  . INSERT / REPLACE / REMOVE PACEMAKER  02/2016  . LEFT HEART CATHETERIZATION WITH CORONARY ANGIOGRAM N/A 12/23/2014   Procedure: LEFT HEART CATHETERIZATION WITH CORONARY ANGIOGRAM;  Surgeon: Burnell Blanks, MD;  Location: [facility] CATH LAB;  Service: Cardiovascular;  Laterality: N/A;  . PERCUTANEOUS CORONARY STENT INTERVENTION (PCI-S)  12/23/2014   Procedure: PERCUTANEOUS CORONARY STENT INTERVENTION (PCI-S);  Surgeon: Burnell Blanks, MD;  Location: [facility] CATH LAB;  Service: Cardiovascular;;  Promus 2.25x8  . TONSILLECTOMY    . TRANSTHORACIC ECHOCARDIOGRAM  11/26/2015   Technically difficult study. EF 55-60%. Normal wall motion. GR 1 DD.  . TUBAL LIGATION    . WRIST FRACTURE SURGERY Bilateral ~ 2000    SOCIAL HISTORY:   Social History   Tobacco Use  . Smoking status: Former Smoker    Packs/day: 1.00    Years: 45.00    Pack years: 45.00    Types: Cigarettes    Last attempt to quit: 04/25/1994    Years since quitting: 23.7  . Smokeless tobacco: Never Used  Substance Use Topics  . Alcohol use: No    Comment: 12/23/2014 "might have a couple mixed  drinks/year"    FAMILY HISTORY:   Family History  Problem Relation Age of Onset  . Heart disease Mother   . Diabetes Mother   . Osteoarthritis Mother   . Hypertension Mother   . Heart disease Father   . Hypertension Father   . COPD Brother     DRUG ALLERGIES:   Allergies  Allergen Reactions  . Ciprofloxacin Shortness Of Breath, Itching and Rash  . Doxycycline Shortness Of Breath, Itching and Rash  . Penicillins Shortness Of Breath, Itching, Rash and Other (See Comments)    Has patient had a PCN reaction causing immediate rash, facial/tongue/throat swelling, SOB or lightheadedness with hypotension: Yes Has patient had a PCN reaction causing severe rash involving mucus membranes or skin necrosis: No  Has patient had a PCN reaction that required hospitalization No Has patient had a PCN reaction occurring within the last 10 years: No If all of the above answers are "NO", then may proceed with Cephalosporin use.  . Sulfa Antibiotics Shortness Of Breath, Itching and Rash  . Latex Itching  . Morphine And Related Itching  . Albuterol Sulfate   . Macrobid [Nitrofurantoin Macrocrystal] Itching  . Cefuroxime Rash    Blisters in mouth    REVIEW OF SYSTEMS:   Review of Systems  Constitutional: Negative.  Negative for chills, fever and malaise/fatigue.  HENT: Negative.  Negative for ear discharge, ear pain, hearing loss, nosebleeds and sore throat.   Eyes: Negative.  Negative for blurred vision and pain.  Respiratory: Positive for shortness of breath. Negative for cough, hemoptysis and wheezing.   Cardiovascular: Positive for leg swelling. Negative for chest pain and palpitations.  Gastrointestinal: Negative.  Negative for abdominal pain, blood in stool, diarrhea, nausea and vomiting.  Genitourinary: Negative.  Negative for dysuria.  Musculoskeletal: Negative.  Negative for back pain.  Skin: Negative.   Neurological: Negative for dizziness, tremors, speech change, focal weakness,  seizures and headaches.  Endo/Heme/Allergies: Negative.  Does not bruise/bleed easily.  Psychiatric/Behavioral: Negative.  Negative for depression, hallucinations and suicidal ideas.    MEDICATIONS AT HOME:   Prior to Admission medications   Medication Sig Start Date End Date Taking? Authorizing Provider  ALPRAZolam Duanne Moron) 0.5 MG tablet Take 0.5 mg by mouth at bedtime. 12/27/16   [provider]  busPIRone (BUSPAR) 7.5 MG tablet Take by mouth. 03/13/17 03/13/18  [provider]  feeding supplement, ENSURE ENLIVE, (ENSURE ENLIVE) LIQD Take 237 mLs by mouth 3 (three) times daily with meals. 03/02/17   Fritzi Mandes, MD  fenofibrate (TRICOR) 48 MG tablet Take 48 mg by mouth every evening.     [provider]  fluticasone furoate-vilanterol (BREO ELLIPTA) 100-25 MCG/INH AEPB Inhale 1 puff into the lungs daily as needed.     [provider]  levalbuterol (XOPENEX) 1.25 MG/0.5ML nebulizer solution Take 1.25 mg by nebulization every 6 (six) hours as needed for wheezing or shortness of breath. 03/02/17   Fritzi Mandes, MD  metoprolol succinate (TOPROL XL) 25 MG 24 hr tablet Take 0.5 tablets (12.5 mg total) by mouth daily. 12/19/16   Minna Merritts, MD  Multiple Vitamins-Minerals (PRESERVISION AREDS 2) CAPS Take 1 capsule by mouth daily.    [provider]  nitroGLYCERIN (NITROSTAT) 0.4 MG SL tablet Place 1 tablet (0.4 mg total) under the tongue every 5 (five) minutes as needed for chest pain. 12/24/14   Barrett, Evelene Croon, PA-C  pantoprazole (PROTONIX) 40 MG tablet Take 40 mg by mouth every evening.     [provider]  sertraline (ZOLOFT) 50 MG tablet Take 1 tablet (50 mg total) by mouth daily. 03/24/16   Gladstone Lighter, MD  tiotropium (SPIRIVA) 18 MCG inhalation capsule Place 18 mcg into inhaler and inhale daily as needed.     [provider]  torsemide (DEMADEX) 20 MG tablet Take 2 tablets (40 mg total) by mouth daily. 03/02/17   Fritzi Mandes, MD   traMADol (ULTRAM) 50 MG tablet Take 50 mg by mouth every 6 (six) hours as needed.    [provider]      VITAL SIGNS:  Blood pressure (!) 146/44, pulse 72, temperature 100.1 F (37.8 C), temperature source Oral, resp. rate (!) 24, height 5\' 4"  (1.626 m), weight 86.6 kg (191 lb), SpO2 94 %.  PHYSICAL EXAMINATION:   Physical Exam  Constitutional: She is oriented to person, place, and time. She appears well-developed and well-nourished. No distress.  HENT:  Head: Normocephalic.  Eyes: No scleral icterus.  Neck: Normal range of motion. Neck supple. No JVD present. No tracheal deviation present.  Cardiovascular: Normal rate and regular rhythm. Exam reveals no gallop and no friction rub.  Murmur heard. Pulmonary/Chest: Effort normal and breath sounds normal. No respiratory distress. She has no wheezes. She has no rales. She exhibits no tenderness.  Abdominal: Soft. Bowel sounds are normal. She exhibits no distension and no mass. There is no tenderness. There is no rebound and no guarding.  Musculoskeletal: Normal range of motion. She exhibits edema.  Neurological: She is alert and oriented to person, place, and time.  Skin: Skin is warm. No rash noted. No erythema.  Psychiatric: Judgment normal.      LABORATORY PANEL:   CBC Recent Labs  Lab 01/15/18 1203  WBC 9.6  HGB 9.1*  HCT 27.4*  PLT 143*   ------------------------------------------------------------------------------------------------------------------  Chemistries  No results for input(s): NA, K, CL, CO2, GLUCOSE, BUN, CREATININE, CALCIUM, MG, AST, ALT, ALKPHOS, BILITOT in the last 168 hours.  Invalid input(s): GFRCGP ------------------------------------------------------------------------------------------------------------------  Cardiac Enzymes No results for input(s): TROPONINI in the last 168  hours. ------------------------------------------------------------------------------------------------------------------  RADIOLOGY:  Dg Chest 2 View  Result Date: 01/15/2018 CLINICAL DATA:  Shortness of breath. EXAM: CHEST - 2 VIEW COMPARISON:  02/27/2017. FINDINGS: AICD noted stable position. Cardiomegaly with diffuse bilateral from interstitial prominence and left-sided pleural effusion. Findings consistent with CHF. Atelectatic changes noted left lung base. No pneumothorax. IMPRESSION: AICD in stable position. Cardiomegaly with diffuse bilateral pulmonary infiltrates/edema and left-sided pleural effusion. Findings most consistent CHF. Electronically Signed   By: Marcello Moores  Register   On: 01/15/2018 11:35    EKG:   Pending IMPRESSION AND PLAN:   84 year old female with chronic hypoxic respiratory failure on 2 L oxygen due to COPD and chronic diastolic heart failure with preserved ejection fraction who presents with shortness of breath and increased O2 requirement.  1.  Acute on chronic hypoxic respiratory failure in the setting of acute on chronic diastolic heart failure and pneumonia Wean oxygen to baseline 2 L as tolerated.  2.  Acute on chronic diastolic heart failure with preserved ejection fraction: Continue Lasix IV Monitor intake and output with daily weight CHF clinic upon discharge Consultation requested with [facility] cardiology   3.  Pneumonia: Patient has multiple allergies As per pharmacy we will continue meropenem and azithromycin  4.  Acute kidney injury and chronic kidney disease stage III: This is likely due to prerenal azotemia from acute diastolic heart failure on chronic. Continue Lasix and monitor BMP  5.  Chronic hypoxic respiratory failure with COPD: No signs of exacerbation  6.  CAD: Continue statin and metoprolol  7.  Elevated troponin: This may be due to demand ischemia Continue to monitor troponins and follow on telemetry Cardiology consultation  requested   All the records are reviewed and case discussed with ED provider. Management plans discussed with the patient and she in agreement  CODE STATUS: DNR  TOTAL TIME TAKING CARE OF THIS PATIENT: 42 minutes.    Ludie Pavlik M.D on 01/15/2018 at 12:23 PM  Between 7am to 6pm - Pager - [phone]  After 6pm go to [email or web address] - password [facility]  Office  [phone]  CC: Primary care physician; Tracie Harrier, MD

## 2018-01-15 NOTE — Progress Notes (Signed)
Pharmacy lovenox dose adjustment Patient admitted for SOB and ordered enoxaparin 40 mg subq daily for VTE prophylaxis. CrCl 22.3 ml/min.  Since CrCl < 30 ml/min will reduce enoxaparin to 30 mg subq daily. Will continue to monitor renal function and adjust if necessary.  Tobie Lords, PharmD, BCPS Clinical Pharmacist 01/15/2018

## 2018-01-16 ENCOUNTER — Inpatient Hospital Stay: Payer: Medicare Other

## 2018-01-16 ENCOUNTER — Encounter: Payer: Self-pay | Admitting: Physician Assistant

## 2018-01-16 ENCOUNTER — Inpatient Hospital Stay (HOSPITAL_COMMUNITY)
Admit: 2018-01-16 | Discharge: 2018-01-16 | Disposition: A | Discharge status: Home / Self Care | Payer: Medicare Other | Attending: Cardiovascular Disease | Admitting: Cardiovascular Disease

## 2018-01-16 DIAGNOSIS — J9601 Acute respiratory failure with hypoxia: Secondary | ICD-10-CM

## 2018-01-16 DIAGNOSIS — I361 Nonrheumatic tricuspid (valve) insufficiency: Secondary | ICD-10-CM

## 2018-01-16 DIAGNOSIS — I5033 Acute on chronic diastolic (congestive) heart failure: Secondary | ICD-10-CM

## 2018-01-16 DIAGNOSIS — J441 Chronic obstructive pulmonary disease with (acute) exacerbation: Secondary | ICD-10-CM

## 2018-01-16 DIAGNOSIS — D500 Iron deficiency anemia secondary to blood loss (chronic): Secondary | ICD-10-CM

## 2018-01-16 LAB — URINALYSIS, COMPLETE (UACMP) WITH MICROSCOPIC
BACTERIA UA: NONE SEEN
BILIRUBIN URINE: NEGATIVE
Glucose, UA: NEGATIVE mg/dL
KETONES UR: NEGATIVE mg/dL
LEUKOCYTES UA: NEGATIVE
Nitrite: NEGATIVE
PROTEIN: NEGATIVE mg/dL
SQUAMOUS EPITHELIAL / LPF: NONE SEEN (ref 0–5)
Specific Gravity, Urine: 1.015 (ref 1.005–1.030)
pH: 5 (ref 5.0–8.0)

## 2018-01-16 LAB — LIPID PANEL
CHOLESTEROL: 121 mg/dL (ref 0–200)
HDL: 24 mg/dL — ABNORMAL LOW (ref >40)
LDL Cholesterol: 61 mg/dL (ref 0–99)
Total CHOL/HDL Ratio: 5 RATIO
Triglycerides: 181 mg/dL — ABNORMAL HIGH (ref <150)
VLDL: 36 mg/dL (ref 0–40)

## 2018-01-16 LAB — TROPONIN I
TROPONIN I: 0.16 ng/mL — AB (ref <0.03)
TROPONIN I: 0.12 ng/mL — AB (ref <0.03)

## 2018-01-16 LAB — GLUCOSE, CAPILLARY: Glucose-Capillary: 149 mg/dL — ABNORMAL HIGH (ref 65–99)

## 2018-01-16 LAB — BASIC METABOLIC PANEL
ANION GAP: 12 (ref 5–15)
BUN: 55 mg/dL — AB (ref 6–20)
CALCIUM: 8.4 mg/dL — AB (ref 8.9–10.3)
CO2: 36 mmol/L — ABNORMAL HIGH (ref 22–32)
CREATININE: 2.47 mg/dL — AB (ref 0.44–1.00)
Chloride: 90 mmol/L — ABNORMAL LOW (ref 101–111)
GFR calc Af Amer: 20 mL/min — ABNORMAL LOW (ref >60)
GFR, EST NON AFRICAN AMERICAN: 17 mL/min — AB (ref >60)
GLUCOSE: 153 mg/dL — AB (ref 65–99)
Potassium: 3.8 mmol/L (ref 3.5–5.1)
Sodium: 138 mmol/L (ref 135–145)

## 2018-01-16 LAB — ECHOCARDIOGRAM COMPLETE
Height: 64 in
WEIGHTICAEL: 3188.73 [oz_av]

## 2018-01-16 LAB — CBC
HCT: 26.1 % — ABNORMAL LOW (ref 35.0–47.0)
HEMOGLOBIN: 8.8 g/dL — AB (ref 12.0–16.0)
MCH: 29.2 pg (ref 26.0–34.0)
MCHC: 33.7 g/dL (ref 32.0–36.0)
MCV: 86.9 fL (ref 80.0–100.0)
Platelets: 148 10*3/uL — ABNORMAL LOW (ref 150–440)
RBC: 3 MIL/uL — ABNORMAL LOW (ref 3.80–5.20)
RDW: 16 % — AB (ref 11.5–14.5)
WBC: 7.8 10*3/uL (ref 3.6–11.0)

## 2018-01-16 LAB — HEMOGLOBIN A1C
HEMOGLOBIN A1C: 5.3 % (ref 4.8–5.6)
MEAN PLASMA GLUCOSE: 105.41 mg/dL

## 2018-01-16 LAB — MRSA PCR SCREENING: MRSA BY PCR: NEGATIVE

## 2018-01-16 MED ORDER — BUDESONIDE 0.5 MG/2ML IN SUSP
0.5000 mg | Freq: Two times a day (BID) | RESPIRATORY_TRACT | Status: DC
  Administered 2018-01-16 – 2018-01-22 (×13): 0.5 mg via RESPIRATORY_TRACT
  Filled 2018-01-16 (×13): qty 2

## 2018-01-16 MED ORDER — TIOTROPIUM BROMIDE MONOHYDRATE 18 MCG IN CAPS
18.0000 ug | ORAL_CAPSULE | Freq: Every day | RESPIRATORY_TRACT | Status: DC
  Administered 2018-01-16: 18 ug via RESPIRATORY_TRACT
  Filled 2018-01-16: qty 5

## 2018-01-16 MED ORDER — CHLORHEXIDINE GLUCONATE 0.12 % MT SOLN
15.0000 mL | Freq: Two times a day (BID) | OROMUCOSAL | Status: DC
  Administered 2018-01-16 – 2018-01-22 (×9): 15 mL via OROMUCOSAL
  Filled 2018-01-16 (×8): qty 15

## 2018-01-16 MED ORDER — HEPARIN SODIUM (PORCINE) 5000 UNIT/ML IJ SOLN
5000.0000 [IU] | Freq: Three times a day (TID) | INTRAMUSCULAR | Status: DC
  Administered 2018-01-16 – 2018-01-22 (×19): 5000 [IU] via SUBCUTANEOUS
  Filled 2018-01-16 (×19): qty 1

## 2018-01-16 MED ORDER — POTASSIUM CHLORIDE CRYS ER 20 MEQ PO TBCR
40.0000 meq | EXTENDED_RELEASE_TABLET | Freq: Once | ORAL | Status: AC
  Administered 2018-01-16: 40 meq via ORAL
  Filled 2018-01-16: qty 2

## 2018-01-16 MED ORDER — ORAL CARE MOUTH RINSE
15.0000 mL | Freq: Two times a day (BID) | OROMUCOSAL | Status: DC
  Administered 2018-01-16 – 2018-01-19 (×8): 15 mL via OROMUCOSAL

## 2018-01-16 MED ORDER — FUROSEMIDE 10 MG/ML IJ SOLN
60.0000 mg | Freq: Two times a day (BID) | INTRAMUSCULAR | Status: DC
  Administered 2018-01-16 – 2018-01-17 (×2): 60 mg via INTRAVENOUS
  Filled 2018-01-16 (×2): qty 6

## 2018-01-16 MED ORDER — AZITHROMYCIN 500 MG PO TABS
500.0000 mg | ORAL_TABLET | Freq: Every day | ORAL | Status: AC
  Administered 2018-01-16 – 2018-01-19 (×4): 500 mg via ORAL
  Filled 2018-01-16 (×5): qty 1

## 2018-01-16 MED ORDER — IPRATROPIUM-ALBUTEROL 0.5-2.5 (3) MG/3ML IN SOLN
3.0000 mL | RESPIRATORY_TRACT | Status: DC
  Administered 2018-01-16 – 2018-01-18 (×12): 3 mL via RESPIRATORY_TRACT
  Filled 2018-01-16 (×13): qty 3

## 2018-01-16 MED ORDER — FLUTICASONE FUROATE-VILANTEROL 100-25 MCG/INH IN AEPB
1.0000 | INHALATION_SPRAY | Freq: Every day | RESPIRATORY_TRACT | Status: DC
  Filled 2018-01-16: qty 28

## 2018-01-16 NOTE — Progress Notes (Signed)
Pt here with pneumonia and CHF.  Has had significant diuretics dosing, but minimal urine output.  Increased work of breathing tonight.  ABG with hypercarbia, though pH normal so likely chronic compensation.  Still, with increased work of breathing in setting of pneumonia, will place on BiPAP and move to stepdown unit.  Jacqulyn Bath [facility] 01/16/2018, 1:25 AM

## 2018-01-16 NOTE — Progress Notes (Signed)
Patient has not urinated since 6:00pm despite receiving Lasix. Patient were bladder scan and the scan showed 250 of urine. Patient was very distended and tender to the touch on her bladder. Called Dr. and received order to put in foley cath. 200 ml of clear yellow urine was returned. Will continue to monitor.

## 2018-01-16 NOTE — Progress Notes (Signed)
PT Cancellation Note  Patient Details Name: Regina Burke MRN: [id number] DOB: [birth year]   Cancelled Treatment:    Reason Eval/Treat Not Completed: Other (comment). Evaluation attempted, however pt very lethargic, unable to keep eyes open. Increased work of breathing noted while resting in bed, O2 sats WNL. Per patient request, she wants to rest and try PT another time. Reports she is usually indep with 4 Wheel [employer] at home.   Emanuel Dowson 01/16/2018, 11:20 AM Greggory Stallion, PT, DPT [phone]

## 2018-01-16 NOTE — Progress Notes (Signed)
[facility] [place] [facility] NAME: Regina Burke    MR#:  [id number]  DATE OF BIRTH:  [birth year]  SUBJECTIVE:    REVIEW OF SYSTEMS:   ROS Tolerating Diet: Tolerating PT:   DRUG ALLERGIES:   Allergies  Allergen Reactions  . Ciprofloxacin Shortness Of Breath, Itching and Rash  . Doxycycline Shortness Of Breath, Itching and Rash  . Penicillins Shortness Of Breath, Itching, Rash and Other (See Comments)    Has patient had a PCN reaction causing immediate rash, facial/tongue/throat swelling, SOB or lightheadedness with hypotension: Yes Has patient had a PCN reaction causing severe rash involving mucus membranes or skin necrosis: No Has patient had a PCN reaction that required hospitalization No Has patient had a PCN reaction occurring within the last 10 years: No If all of the above answers are "NO", then may proceed with Cephalosporin use.  . Sulfa Antibiotics Shortness Of Breath, Itching and Rash  . Latex Itching  . Morphine And Related Itching  . Albuterol Sulfate   . Macrobid [Nitrofurantoin Macrocrystal] Itching  . Cefuroxime Rash    Blisters in mouth    VITALS:  Blood pressure (!) 127/47, pulse 70, temperature 98.4 F (36.9 C), temperature source Oral, resp. rate 18, height 5\' 4"  (1.626 m), weight 90.4 kg (199 lb 4.7 oz), SpO2 92 %.  PHYSICAL EXAMINATION:   Physical Exam  GENERAL:  83 y.o.-year-old patient lying in the bed with no acute distress.  EYES: Pupils equal, round, reactive to light and accommodation. No scleral icterus. Extraocular muscles intact.  HEENT: Head atraumatic, normocephalic. Oropharynx and nasopharynx clear.  NECK:  Supple, no jugular venous distention. No thyroid enlargement, no tenderness.  LUNGS: Normal breath sounds bilaterally, no wheezing, rales, rhonchi. No use of accessory muscles of respiration.  CARDIOVASCULAR: S1, S2 normal. No murmurs, rubs, or gallops.  ABDOMEN: Soft, nontender,  nondistended. Bowel sounds present. No organomegaly or mass.  EXTREMITIES: No cyanosis, clubbing or edema b/l.    NEUROLOGIC: Cranial nerves II through XII are intact. No focal Motor or sensory deficits b/l.   PSYCHIATRIC:  patient is alert and oriented x 3.  SKIN: No obvious rash, lesion, or ulcer.   LABORATORY PANEL:  CBC Recent Labs  Lab 01/16/18 0509  WBC 7.8  HGB 8.8*  HCT 26.1*  PLT 148*    Chemistries  Recent Labs  Lab 01/15/18 1203 01/16/18 0817  NA 138 138  K 3.5 3.8  CL 89* 90*  CO2 33* 36*  GLUCOSE 203* 153*  BUN 45* 55*  CREATININE 2.12* 2.47*  CALCIUM 8.8* 8.4*  AST 33  --   ALT 14  --   ALKPHOS 45  --   BILITOT 1.8*  --    Cardiac Enzymes Recent Labs  Lab 01/16/18 0817  TROPONINI 0.12*   RADIOLOGY:  Dg Chest 2 View  Result Date: 01/15/2018 CLINICAL DATA:  Shortness of breath. EXAM: CHEST - 2 VIEW COMPARISON:  02/27/2017. FINDINGS: AICD noted stable position. Cardiomegaly with diffuse bilateral from interstitial prominence and left-sided pleural effusion. Findings consistent with CHF. Atelectatic changes noted left lung base. No pneumothorax. IMPRESSION: AICD in stable position. Cardiomegaly with diffuse bilateral pulmonary infiltrates/edema and left-sided pleural effusion. Findings most consistent CHF. Electronically Signed   By: Marcello Moores  Register   On: 01/15/2018 11:35   Dg Chest Port 1 View  Result Date: 01/16/2018 CLINICAL DATA:  Cough EXAM: PORTABLE CHEST 1 VIEW COMPARISON:  01/15/2018, 02/27/2017, 01/31/2017 FINDINGS: Left-sided pacing device as before.  Cardiomegaly with vascular congestion and aortic atherosclerosis. Patchy multifocal left greater than right airspace disease without significant interval change. Probable small pleural effusions. No pneumothorax. IMPRESSION: 1. Cardiomegaly with vascular congestion. 2. Patchy multifocal airspace disease, some of which is slightly nodular in configuration, possible multifocal pneumonia. Increasing  consolidation at the left lung base. Small pleural effusions. Electronically Signed   By: Donavan Foil M.D.   On: 01/16/2018 01:08   ASSESSMENT AND PLAN:   Regina Burke  is a 84 y.o. female with a known history of chronic diastolic heart failure pEF, CAD and chronic hypoxic respiratory failure due to COPD who presents today to the emergency room due to shortness of breath.  Patient reports over the past 2 weeks she has had increasing shortness of breath, lower extremity edema, PND and orthopnea.  She also states she has had a productive cough.  She wears 2 L of oxygen at home today was increased to 3 L.  1.  Acute on chronic hypoxic respiratory failure in the setting of acute on chronic diastolic heart failure and pneumonia Wean oxygen to baseline 2 L as tolerated.  2.  Acute on chronic diastolic heart failure with preserved ejection fraction: Continue Lasix IV 60 mg BID Monitor intake and output with daily weight CHF clinic upon discharge Consultation with [facility] cardiology appreciated.  3.  Pneumonia: Patient has multiple allergies As per pharmacy we will continue azithromycin Pro calcitonin 6.05  4.  Acute kidney injury and chronic kidney disease stage III: This is likely due to prerenal azotemia from acute diastolic heart failure on chronic. Continue Lasix and monitor BMP  5.  Chronic hypoxic respiratory failure with COPD: No signs of exacerbation  6.  CAD: Continue statin and metoprolol  7.  Elevated troponin: This may be due to demand ischemia Continue to monitor troponins and follow on telemetry follow cardiology recommendations    Case discussed with Care Management/Social Worker. Management plans discussed with the patient, family and they are in agreement.  CODE STATUS: full  DVT Prophylaxis: heparin  TOTAL TIME TAKING CARE OF THIS PATIENT: *25* minutes.  >50% time spent on counselling and coordination of care  POSSIBLE D/C IN *few* DAYS, DEPENDING ON  CLINICAL CONDITION.  Note: This dictation was prepared with Dragon dictation along with smaller phrase technology. Any transcriptional errors that result from this process are unintentional.  Fritzi Mandes M.D on 01/16/2018 at 3:08 PM  Between 7am to 6pm - Pager - [phone]  After 6pm go to [email or web address] - Proofreader  [facility]  Office  [phone]  CC: Primary care physician; Tracie Harrier, MDPatient ID: Regina Burke, female   DOB: 01-Apr-1933, 84 y.o.   MRN: [id number]

## 2018-01-16 NOTE — Progress Notes (Signed)
Pharmacy Antibiotic Note  Regina Burke is a 84 y.o. female admitted on 01/15/2018 with pneumonia. Pharmacy has been consulted for Ceftriaxone dosing. Patient has several abx allergies documented. Tolerated Ceftriaxone on admission in September 2016.  Plan: Will d/c meropenem and initiate Ceftriaxone 1 g IV daily.  Will continue azithromycin 500 mg PO daily for total of days of therapy.   Height: 5\' 4"  (162.6 cm) Weight: 199 lb 4.7 oz (90.4 kg) IBW/kg (Calculated) : 54.7  Temp (24hrs), Avg:98.6 F (37 C), Min:98.1 F (36.7 C), Max:99.7 F (37.6 C)  Recent Labs  Lab 01/15/18 1203 01/15/18 1542 01/16/18 0509 01/16/18 0817  WBC 9.6  --  7.8  --   CREATININE 2.12*  --   --  2.47*  LATICACIDVEN 1.3 1.7  --   --     Estimated Creatinine Clearance: 19.1 mL/min (A) (by C-G formula based on SCr of 2.47 mg/dL (H)).    Allergies  Allergen Reactions  . Ciprofloxacin Shortness Of Breath, Itching and Rash  . Doxycycline Shortness Of Breath, Itching and Rash  . Penicillins Shortness Of Breath, Itching, Rash and Other (See Comments)    Has patient had a PCN reaction causing immediate rash, facial/tongue/throat swelling, SOB or lightheadedness with hypotension: Yes Has patient had a PCN reaction causing severe rash involving mucus membranes or skin necrosis: No Has patient had a PCN reaction that required hospitalization No Has patient had a PCN reaction occurring within the last 10 years: No If all of the above answers are "NO", then may proceed with Cephalosporin use.  . Sulfa Antibiotics Shortness Of Breath, Itching and Rash  . Latex Itching  . Morphine And Related Itching  . Albuterol Sulfate   . Macrobid [Nitrofurantoin Macrocrystal] Itching  . Cefuroxime Rash    Blisters in mouth    Antimicrobials this admission: Meropenem 5/21 >> 5/21 Azithromycin 5/21 >> 5/25 Ceftriaxone 5/22 >>   Dose adjustments this admission: 5/22 Azithromycin transitioned to PO.  Microbiology  results: 5/21 BCx: no growth < 24 hr 5/21 UCx: pending  5/22 MRSA PCR: negative  Thank you for allowing pharmacy to be a part of this patient's care.  Ricardo Jericho 01/16/2018 11:09 AM

## 2018-01-16 NOTE — Progress Notes (Signed)
Report was called to Nicole Kindred, Therapist, sports. Patient will be transferred to ICU.

## 2018-01-16 NOTE — Consult Note (Signed)
Cardiology Consultation:   Patient ID: Regina Burke; [id number]; 06/07/1933   Admit date: 01/15/2018 Date of Consult: 01/16/2018  Primary Care Provider: Tracie Harrier, MD Primary Cardiologist: Regina Burke Primary Electrophysiologist:  Regina Burke   Patient Profile:   Regina Burke is a 84 y.o. female with a hx of CAD s/p prior NSTEMI and PCI/DES of the OM3 (11/2014), chronic combined CHF (EF 30-35% 03/2016), bilateral pleural effusions requiring thoracentesis in 05/2015, CKD IV with AKI requiring intermittent dialysis in the past, chronic respiratory failure with hypoxia and hypercapnia on home oxygen at 2 L via nasal cannula secondary to COPD, HTN, HL, DMII, persistent AFib s/p AVN RFCA &BiV PPM placement in 12/3644 complicated by possible phrenic pacing, COPD, IDA due to GI bleed/chronic blood loss with intermittent anticoagulation use, OSA, and gout who is being seen today for the evaluation of acute on chronic combined CHF at the request of Regina Burke.  History of Present Illness:   Regina Burke was admitted in 11/2014 for unstable angina with successful PCI to [facility]. Echo at that time showed an EF of 60-65%. Follow up echo in 10/2015 showed an EF of 55-60%. She has a history of difficult to control ventricular rates with her Afib and has been previously managed on metoprolol, Cardizem, and amiodarone. She underwent briefly successful DCCV in 02/2016, lasting only for a couple of hours. This was followed by repeat admission in 02/2016 for volume overload. TTE showed newly depressed EF of 35-40%, diffuse HK, mild MR, LA moderately dilated, PASP 73 mm Hg. She was diuresed and rate controlled with follow up TTE in late 02/2016 showing an EF of 35-40%, LA severely dilated, moderately increased PASP, left pleural effusion. She was seen by EP given her symptomatic Afib with RVR with difficult to control ventricular rates and underwent successful AV nodal ablation with CRT-D in 03/2016. She was advised to  continue Eliquis at that time given her CHADS2VASc of at least 6.   She was admitted in 12/2016 for GI bleed requiring blood transfusion and holding of [place]. She had acute on CKD requiring intermittent HD. Echo in 12/2016 showed an EF of 65-70%. Admitted to the hospital in 01/2017 following a mechanical fall in which she fractured her left hip. She underwent surgical repair at that time.   Over the past several weeks she has noted a worsening of lower extremity edema followed by increased SOB and cough that has been productive of grey sputum over the past 1 week. She has stable orthopnea, sleeps in a recliner for many years. She has maintained her baseline supplemental oxygen at 2 L via nasal cannula. She has noted subjective fevers, chills, and myalgias. Mild chest pressure, which she is uncertain if this is similar to prior PCI. Weight has increased from 191-->199 pounds over the prior 2 months. She has not been compliant with a low sodium diet as she has been eating a lot of ham lately. She also reports drinking > 2 L of fluids daily. She has been complaint with her home torsemide 40 to 30 mg alternating days.   Upon the patient's arrival to [facility] they were found to have BP 146/44, HR 72 bpm, temp 100.1, oxygen saturation 94% on room air, weight 191-->199 pounds. EKG showed V paced rhythm, CXR showed cardiomegaly with vascular congestion with possible multifocal PNA. Labs showed BNP 1235, tropon 0.13-->0.14-->0.15-->0.16 x2, SCr 2.12 (baseline 1.5-2.5), BUN 45, K+ 3.5, glucose 203, lactic acid 1.3, WBC 9.6, HGB 9.1 (baseline 8-9), PLT  143, blood culture no growth to date x 2, PCT 6.05, TSH 1.418, ABG pCO2 of 70, pO2 of 73. She was given IV Lasix 60 mg followed by 40 mg with a documented UOP of 425 mL. She was also started on ABX per IM as well as nebs. She has been weaned from BiPAP to her baseline 2 L via nasal cannula. Currently, without chest pain. She does not feel like there has been a significant  improvement in her SOB to date.   Past Medical History:  Diagnosis Date  . Anemia of chronic disease   . Arthritis   . Asthma   . Cardiac resynchronization therapy pacemaker (CRT-P) in place    a. 03/31/16:  Medtronic Percepta Quad CRT-P MRI SureScan (serial Number [id number]) device.  . Chronic diastolic CHF (congestive heart failure) ([place])    a. 10/2015 Echo: EF 55-65%, Gr1 DD, mild MR, mildly dil LA, nl RV fxn, nl PASP;  b. 03/2016 Echo: EF 30-35%, mod MR, mod dil LA; c. 12/2016 Echo: EF 60-65%, no rwma, mild AS, mildly dil LA, PASP 1mmHg.  . CKD (chronic kidney disease), stage IV ([place])   . COPD (chronic obstructive pulmonary disease) ([place])   . Coronary artery disease    a. 11/2014 NSTEMI/PCI: LM nl, LAD 60m, D1 30, LCX mild dzs, OM1 20p, OM2 38m, OM3 90p (2.25x8 Promus Premier DES), RCA nl.   . Cough    CHRONIC AT NIGHT  . Cushing's disease ([place])   . Depression   . Diverticulitis   . Edema    FEET/LEGS  . GERD (gastroesophageal reflux disease)   . Gout   . History of hiatal hernia   . History of pneumonia   . HLD (hyperlipidemia)   . HOH (hard of hearing)   . Hypertensive heart disease   . Lung cancer ([place]) dx'd 2014   S/P radiation 2015  . Migraine   . Mixed Ischemic and Nonischemic Cardiomyopathy ([place])    a. 03/2016 Echo: EF 30-35%;  b. 12/2016 Echo: EF 60-65%.  . Moderate mitral regurgitation   . Multiple allergies   . Myocardial infarction ([place])   . Oxygen deficiency    2LITERS  . Persistent atrial fibrillation ([place])    a. CHADS2VASc ==> 7 (CHF, HTN, age x 2, DM, vascular disease, and gender)--was on renal dosed eliquis but this was d/c'd in 12/2016 in setting of dark stools/anemia;  c. 03/2016 s/p AVN and MDT BiV ICD placement.  . Presence of permanent cardiac pacemaker   . S/P AV nodal ablation    a. on 03/31/16 for persistant afib with CRT-P placement  . Sleep apnea   . Type II diabetes mellitus ([place])     Past Surgical History:  Procedure Laterality Date  .  ABDOMINAL HYSTERECTOMY    . ABLATION     July 2017  . ADRENALECTOMY Left 1980's   "[place]"  . APPENDECTOMY    . BREAST CYST EXCISION Left   . CATARACT EXTRACTION W/PHACO Right 12/28/2015   Procedure: CATARACT EXTRACTION PHACO AND INTRAOCULAR LENS PLACEMENT (IOC);  Surgeon: Birder Robson, MD;  Location: [facility] ORS;  Service: Ophthalmology;  Laterality: Right;  [place] 48.4   . CATARACT EXTRACTION W/PHACO Left 11/14/2016   Procedure: CATARACT EXTRACTION PHACO AND INTRAOCULAR LENS PLACEMENT (IOC);  Surgeon: Birder Robson, MD;  Location: [facility] ORS;  Service: Ophthalmology;  Laterality: Left;  [place] 59.8 AP% 18.1 CDE 10.78 Fluid pack lot # [id number]  . CHOLECYSTECTOMY    . CORONARY ANGIOPLASTY WITH  STENT PLACEMENT  12/23/2014  . ELECTROPHYSIOLOGIC STUDY N/A 03/08/2016   Procedure: CARDIOVERSION;  Surgeon: Wende Bushy, MD;  Location: [facility] ORS;  Service: Cardiovascular;  Laterality: N/A;  . ELECTROPHYSIOLOGIC STUDY N/A 03/07/2016   Procedure: Cardioversion;  Surgeon: Wende Bushy, MD;  Location: [facility] ORS;  Service: Cardiovascular;  Laterality: N/A;  . ELECTROPHYSIOLOGIC STUDY N/A 03/31/2016   Procedure: AV Node Ablation;  Surgeon: Will Meredith Leeds, MD;  Location: [place] CV LAB;  Service: Cardiovascular;  Laterality: N/A;  . EP IMPLANTABLE DEVICE N/A 03/31/2016   Procedure: BiV Pacemaker Insertion CRT-P;  Surgeon: Will Meredith Leeds, MD;  Location: [place] CV LAB;  Service: Cardiovascular;  Laterality: N/A;  . ESOPHAGOGASTRODUODENOSCOPY (EGD) WITH PROPOFOL N/A 01/26/2017   Procedure: ESOPHAGOGASTRODUODENOSCOPY (EGD) WITH PROPOFOL;  Surgeon: [name], MD;  Location: [facility] ENDOSCOPY;  Service: Endoscopy;  Laterality: N/A;  . FRACTURE SURGERY    . HIP PINNING,CANNULATED Left 02/01/2017   Procedure: CANNULATED HIP PINNING;  Surgeon: Lovell Sheehan, MD;  Location: [facility] ORS;  Service: Orthopedics;  Laterality: Left;  . INSERT / REPLACE / REMOVE PACEMAKER  02/2016  . LEFT HEART CATHETERIZATION WITH  CORONARY ANGIOGRAM N/A 12/23/2014   Procedure: LEFT HEART CATHETERIZATION WITH CORONARY ANGIOGRAM;  Surgeon: Burnell Blanks, MD;  Location: [facility] CATH LAB;  Service: Cardiovascular;  Laterality: N/A;  . PERCUTANEOUS CORONARY STENT INTERVENTION (PCI-S)  12/23/2014   Procedure: PERCUTANEOUS CORONARY STENT INTERVENTION (PCI-S);  Surgeon: Burnell Blanks, MD;  Location: [facility] CATH LAB;  Service: Cardiovascular;;  Promus 2.25x8  . TONSILLECTOMY    . TRANSTHORACIC ECHOCARDIOGRAM  11/26/2015   Technically difficult study. EF 55-60%. Normal wall motion. GR 1 DD.  . TUBAL LIGATION    . WRIST FRACTURE SURGERY Bilateral ~ 2000     Home Meds: Prior to Admission medications   Medication Sig Start Date End Date Taking? Authorizing Provider  ALPRAZolam Duanne Moron) 0.5 MG tablet Take 0.5 mg by mouth at bedtime. 12/27/16  Yes [provider]  atorvastatin (LIPITOR) 20 MG tablet Take 20 mg by mouth daily.   Yes [provider]  busPIRone (BUSPAR) 7.5 MG tablet Take 7.5 mg by mouth 2 (two) times daily.  03/13/17 03/13/18 Yes [provider]  feeding supplement, ENSURE ENLIVE, (ENSURE ENLIVE) LIQD Take 237 mLs by mouth 3 (three) times daily with meals. 03/02/17  Yes Fritzi Mandes, MD  fenofibrate (TRICOR) 48 MG tablet Take 48 mg by mouth every evening.    Yes [provider]  fluticasone furoate-vilanterol (BREO ELLIPTA) 100-25 MCG/INH AEPB Inhale 1 puff into the lungs daily as needed (shortness of breath).    Yes [provider]  metoprolol succinate (TOPROL XL) 25 MG 24 hr tablet Take 25 mg by mouth daily.  12/19/16  Yes Minna Merritts, MD  morphine 10 MG/5ML solution Take 5 mg by mouth daily as needed for severe pain.   Yes [provider]  nitroGLYCERIN (NITROSTAT) 0.4 MG SL tablet Place 1 tablet (0.4 mg total) under the tongue every 5 (five) minutes as needed for chest pain. 12/24/14  Yes Barrett, Evelene Croon, PA-C  pantoprazole (PROTONIX) 40 MG tablet Take 40  mg by mouth daily as needed (heartburn).    Yes [provider]  sertraline (ZOLOFT) 50 MG tablet Take 1 tablet (50 mg total) by mouth daily. 03/24/16  Yes Gladstone Lighter, MD  tiotropium (SPIRIVA) 18 MCG inhalation capsule Place 18 mcg into inhaler and inhale daily as needed (shortness of breath).    Yes [provider]  torsemide (DEMADEX) 20 MG tablet Take 2 tablets (40 mg total) by mouth daily. Patient taking differently: Take 30-40 mg by mouth daily. Dose depending upon fluid retention. 03/02/17  Yes Fritzi Mandes, MD  levalbuterol Penne Lash) 1.25 MG/0.5ML nebulizer solution Take 1.25 mg by nebulization every 6 (six) hours as needed for wheezing or shortness of breath. Patient not taking: Reported on 01/15/2018 03/02/17   Fritzi Mandes, MD    Inpatient Medications: Scheduled Meds: . ALPRAZolam  0.5 mg Oral QHS  . atorvastatin  20 mg Oral Daily  . busPIRone  7.5 mg Oral BID  . chlorhexidine  15 mL Mouth Rinse BID  . fenofibrate  54 mg Oral Daily  . fluticasone furoate-vilanterol  1 puff Inhalation Daily  . furosemide  60 mg Intravenous Daily  . heparin  5,000 Units Subcutaneous Q8H  . mouth rinse  15 mL Mouth Rinse q12n4p  . metoprolol succinate  25 mg Oral Daily  . sertraline  50 mg Oral Daily  . sodium chloride flush  3 mL Intravenous Q12H  . tiotropium  18 mcg Inhalation Daily   Continuous Infusions: . sodium chloride    . azithromycin (ZITHROMAX) 500 MG IVPB (Vial-Mate [employer]or)    . meropenem (MERREM) IV     PRN Meds: sodium chloride, acetaminophen **OR** acetaminophen, bisacodyl, ibuprofen, levalbuterol, nitroGLYCERIN, ondansetron **OR** ondansetron (ZOFRAN) IV, pantoprazole, polyethylene glycol, sodium chloride flush  Allergies:   Allergies  Allergen Reactions  . Ciprofloxacin Shortness Of Breath, Itching and Rash  . Doxycycline Shortness Of Breath, Itching and Rash  . Penicillins Shortness Of Breath, Itching, Rash and Other (See Comments)    Has patient had  a PCN reaction causing immediate rash, facial/tongue/throat swelling, SOB or lightheadedness with hypotension: Yes Has patient had a PCN reaction causing severe rash involving mucus membranes or skin necrosis: No Has patient had a PCN reaction that required hospitalization No Has patient had a PCN reaction occurring within the last 10 years: No If all of the above answers are "NO", then may proceed with Cephalosporin use.  . Sulfa Antibiotics Shortness Of Breath, Itching and Rash  . Latex Itching  . Morphine And Related Itching  . Albuterol Sulfate   . Macrobid [Nitrofurantoin Macrocrystal] Itching  . Cefuroxime Rash    Blisters in mouth    Social History:   Social History   Socioeconomic History  . Marital status: Widowed    Spouse name: Not on file  . Number of children: Not on file  . Years of education: Not on file  . Highest education level: Not on file  Occupational History  . Occupation: retired  Scientific laboratory technician  . Financial resource strain: Not on file  . Food insecurity:    Worry: Not on file    Inability: Not on file  . Transportation needs:    Medical: Not on file    Non-medical: Not on file  Tobacco Use  . Smoking status: Former Smoker    Packs/day: 1.00    Years: 45.00    Pack years: 45.00    Types: Cigarettes    Last attempt to quit: 04/25/1994    Years since quitting: 23.7  . Smokeless tobacco: Never Used  Substance and Sexual Activity  . Alcohol use: No    Comment: 12/23/2014 "might have a couple mixed drinks/year"  . Drug use: No  . Sexual activity: Never  Lifestyle  . Physical activity:    Days per week: Not on file    Minutes per session: Not on  file  . Stress: Not on file  Relationships  . Social connections:    Talks on phone: Not on file    Gets together: Not on file    Attends religious service: Not on file    Active member of club or organization: Not on file    Attends meetings of clubs or organizations: Not on file    Relationship  status: Not on file  . Intimate partner violence:    Fear of current or ex partner: Not on file    Emotionally abused: Not on file    Physically abused: Not on file    Forced sexual activity: Not on file  Other Topics Concern  . Not on file  Social History Narrative   Lives locally with son.  Does not routinely exercise.     Family History:   Family History  Problem Relation Age of Onset  . Heart disease Mother   . Diabetes Mother   . Osteoarthritis Mother   . Hypertension Mother   . Heart disease Father   . Hypertension Father   . COPD Brother     ROS:  Review of Systems  Constitutional: Positive for fever and malaise/fatigue. Negative for chills, diaphoresis and weight loss.  HENT: Negative for congestion.   Eyes: Negative for discharge and redness.  Respiratory: Positive for cough, sputum production and shortness of breath. Negative for hemoptysis and wheezing.        Grey sputum  Cardiovascular: Positive for chest pain, orthopnea and leg swelling. Negative for palpitations, claudication and PND.       Chronic orthopnea, sleeps in a recliner at baseline  Gastrointestinal: Negative for abdominal pain, blood in stool, heartburn, melena, nausea and vomiting.  Genitourinary: Negative for hematuria.  Musculoskeletal: Positive for myalgias. Negative for falls.  Skin: Negative for rash.  Neurological: Positive for weakness. Negative for dizziness, tingling, tremors, sensory change, speech change, focal weakness and loss of consciousness.  Endo/Heme/Allergies: Does not bruise/bleed easily.  Psychiatric/Behavioral: Negative for substance abuse. The patient is not nervous/anxious.   All other systems reviewed and are negative.     Physical Exam/Data:   Vitals:   01/16/18 0412 01/16/18 0500 01/16/18 0600 01/16/18 0738  BP: (!) 155/60 (!) 102/37 (!) 120/46   Pulse: 73 70 69   Resp: (!) 24 17 18    Temp: 99.7 F (37.6 C)   98.4 F (36.9 C)  TempSrc: Axillary   Oral  SpO2:  99% 97% 97%   Weight:      Height:        Intake/Output Summary (Last 24 hours) at 01/16/2018 0836 Last data filed at 01/16/2018 0500 Gross per 24 hour  Intake -  Output 425 ml  Net -425 ml   Filed Weights   01/15/18 1526 01/16/18 0330 01/16/18 0400  Weight: 199 lb 6.4 oz (90.4 kg) 199 lb 4.8 oz (90.4 kg) 199 lb 4.7 oz (90.4 kg)   Body mass index is 34.21 kg/m.   Physical Exam: General: Well developed, well nourished, in no acute distress. Head: Normocephalic, atraumatic, sclera non-icteric, no xanthomas, nares without discharge.  Neck: Negative for carotid bruits. JVD difficult to assess. Lungs: Diminished breath sounds bilaterally with coarse rhonchi and mild bibasilar crackles. Breathing is unlabored on 2 L via nasal cannula.  Heart: Irregularly irregular with S1 S2. No murmurs, rubs, or gallops appreciated. Abdomen: Soft, non-tender, non-distended with normoactive bowel sounds. No hepatomegaly. No rebound/guarding. No obvious abdominal masses. Msk:  Strength and tone appear normal for  age. Extremities: No clubbing or cyanosis. Trace bilateral lower extremity pretibial edema. Distal pedal pulses are 2+ and equal bilaterally. Neuro: Alert and oriented X 3. No facial asymmetry. No focal deficit. Moves all extremities spontaneously. Psych:  Responds to questions appropriately with a normal affect.   EKG:  The EKG was personally reviewed and demonstrates: V paced with underlying Afib, 75 bpm Telemetry:  Telemetry was personally reviewed and demonstrates: V paced with underlying Afib, 70s bpm  Weights: Filed Weights   01/15/18 1526 01/16/18 0330 01/16/18 0400  Weight: 199 lb 6.4 oz (90.4 kg) 199 lb 4.8 oz (90.4 kg) 199 lb 4.7 oz (90.4 kg)    Relevant CV Studies: TTE 12/2016: Study Conclusions  - Limited study to evaluate pericardial effusion. - Pericardium, extracardiac: A small to moderate pericardial   effusion was identified posterior to the heart. The appearance is    similar to the prior echo from 01/13/17. The fluid collection is   not accessible from a subxyphoid or apical approach. There was a   left pleural effusion. - Left ventricle: The cavity size was normal. There was mild   concentric hypertrophy. Systolic function was vigorous. The   estimated ejection fraction was in the range of 65% to 70%. - Mitral valve: Calcified annulus. There was mild regurgitation. - Left atrium: The atrium was moderately dilated.   TTE pending  Laboratory Data:  Chemistry Recent Labs  Lab 01/15/18 1203  NA 138  K 3.5  CL 89*  CO2 33*  GLUCOSE 203*  BUN 45*  CREATININE 2.12*  CALCIUM 8.8*  GFRNONAA 21*  GFRAA 24*  ANIONGAP 16*    Recent Labs  Lab 01/15/18 1203  PROT 7.4  ALBUMIN 3.7  AST 33  ALT 14  ALKPHOS 45  BILITOT 1.8*   Hematology Recent Labs  Lab 01/15/18 1203 01/16/18 0509  WBC 9.6 7.8  RBC 3.18* 3.00*  HGB 9.1* 8.8*  HCT 27.4* 26.1*  MCV 86.1 86.9  MCH 28.6 29.2  MCHC 33.2 33.7  RDW 16.4* 16.0*  PLT 143* 148*   Cardiac Enzymes Recent Labs  Lab 01/15/18 1203 01/15/18 1542 01/15/18 1933 01/15/18 2118 01/16/18 0148  TROPONINI 0.13* 0.14* 0.15* 0.16* 0.16*   No results for input(s): TROPIPOC in the last 168 hours.  BNP Recent Labs  Lab 01/15/18 1203  BNP 1,235.0*    DDimer No results for input(s): DDIMER in the last 168 hours.  Radiology/Studies:  Dg Chest 2 View  Result Date: 01/15/2018 IMPRESSION: AICD in stable position. Cardiomegaly with diffuse bilateral pulmonary infiltrates/edema and left-sided pleural effusion. Findings most consistent CHF. Electronically Signed   By: [place]   On: 01/15/2018 11:35   Dg Chest Port 1 View  Result Date: 01/16/2018 IMPRESSION: 1. Cardiomegaly with vascular congestion. 2. Patchy multifocal airspace disease, some of which is slightly nodular in configuration, possible multifocal pneumonia. Increasing consolidation at the left lung base. Small pleural effusions.  Electronically Signed   By: Donavan Foil M.D.   On: 01/16/2018 01:08    Assessment and Plan:   1. Acute on chronic respiratory failure with hypoxia and hypercapnia: -Likely multifactorial including AECOPD, multifocal PNA, acute on chronic combined CHF, and anemia of chronic disease -BiPAP has been weaned to her baseline 2 L via nasal cannula  2. Acute on chronic combined CHF: -Documented UOP of 425 mL for the admission to date -Continue IV Lasix 60 mg daily -Toprol XL -Not on ACEi/ARB/ARNI/spiro given CKD -Echo pending -CHF education -Strict Is and Os -  Likely exacerbated by her pulmonary infection as well as dietary noncompliance   3. AECOPD/multifocal PNA: -BiPAP per PCCM -ABX, nebs, steroids per IM  4. Persistent Afib s/p AV nodal ablation with CRT-D: -Ventricular rates well controlled -Continue Toprol XL for rate control -Not on anticoagulation given GI bleeds  5. Elevated troponin/CAD as above: -Troponin elevated with a peak of 0.16 x 2 -Chest pain free -Echo pending -Not on ASA given prior GI bleed and anemia  -Recommend lipid and A1c for further risk stratification   6. CKD stage IV: -Renal function stable -Monitor with diuresis   7. Anemia of chronic disease: -Stable  8. History of GI bleed: -HGB stable  9. DM2: -Recommend A1c  10. HTN: -Blood pressure improved -Continue current medications  11. HLD: -Check lipid    For questions or updates, please contact [place] Please consult [email or web address] for contact info under Cardiology/STEMI.   Signed, Christell Faith, PA-C [place] Pager: [phone] 01/16/2018, 8:36 AM

## 2018-01-16 NOTE — Progress Notes (Signed)
Pharmacy Electrolyte Monitoring Consult:  Pharmacy consulted to assist in monitoring and replacing electrolytes in this 84 y.o. female admitted on 01/15/2018 with Respiratory Distress  Patient received furosemide 100mg  IV on 5/21 and is ordered furosemide 60mg  IV Daily.   Labs:  Sodium (mmol/L)  Date Value  01/16/2018 138  04/13/2016 142  12/09/2014 134 (L)   Potassium (mmol/L)  Date Value  01/16/2018 3.8  12/09/2014 4.5   Magnesium (mg/dL)  Date Value  03/17/2016 2.1   Phosphorus (mg/dL)  Date Value  01/25/2017 6.5 (H)   Calcium (mg/dL)  Date Value  01/16/2018 8.4 (L)   Calcium, Total (mg/dL)  Date Value  12/09/2014 8.7 (L)   Albumin (g/dL)  Date Value  01/15/2018 3.7  12/09/2014 3.9    Assessment/Plan: Will order potassium 71mEq PO x 1. Will recheck all electrolytes with am labs.   Pharmacy will continue to monitor and adjust per consult.   Ellington Cornia L 01/16/2018 11:21 AM

## 2018-01-16 NOTE — Care Management (Addendum)
Santiago Glad with [facility] updated on patient's presentation. She is checking patient status. Patient was discharged from their services in Dec. 2018.

## 2018-01-16 NOTE — Progress Notes (Signed)
eLink Physician-Brief Progress Note Patient Name: Regina Burke DOB: Jan 04, 1933 MRN: [id number]   Date of Service  01/16/2018  HPI/Events of Note  84 yo female recently admitted with the diagnosis of pneumonia and heart failure. Increased WOB tonight in spite of aggressive diuresis. Now being transferred to a stepdown bed for initiation of BiPAP.  PCCM asked to assume care. VSS.   eICU Interventions  No new orders.      Intervention Category Evaluation Type: New Patient Evaluation  Lysle Dingwall 01/16/2018, 4:08 AM

## 2018-01-16 NOTE — Progress Notes (Signed)
*  PRELIMINARY RESULTS* Echocardiogram 2D Echocardiogram has been performed.  Regina Burke Regina Burke Regina Burke 01/16/2018, 11:16 AM

## 2018-01-16 NOTE — Progress Notes (Signed)
Pt placed back on bipap d/t increased WOB, somnolence, expiratory wheezing despite pulmicort neb. Echo preformed. Furosemide 60mg  BID ordered for RV pressures-Cr 2.47 up from 2.12, BUN 55, UO minimal. First dose of lasix HELD for soft pressures/MAP <60- her sched metop 25mg  was given this am and may be too much for her heart to tolerate with lasix 60mg  on board.    Pt has remained alert and oriented with no c/o pain. Pt has mentioned a stiff neck. Pt has remained in NSR, BP labile, HR V paced at 70. Pt has a poor appetite with minimal intake. Family have been updated at bedside.

## 2018-01-16 NOTE — Consult Note (Signed)
Name: Regina Burke MRN: [id number] DOB: Jan 10, 1933    ADMISSION DATE:  01/15/2018 CONSULTATION DATE:  01/16/2018  REFERRING MD : Dr. Jannifer Franklin   CHIEF COMPLAINT: Shortness of Breath   BRIEF PATIENT DESCRIPTION:  84 yo female admitted to telemetry unit on 05/21 with acute on chronic respiratory failure secondary to pneumonia and CHF exacerbation transferred to stepdown unit on 05/22 due to worsening respiratory failure despite aggressive diuresis requiring Bipap   SIGNIFICANT EVENTS/STUDIES:  05/21 Pt admitted to telemetry unit  05/22 Pt transferred to stepdown unit   HISTORY OF PRESENT ILLNESS:   This is an 84 yo female with a PMH of Type II Diabetes Mellitus, OSA, Persistent Atrial Fibrillation s/p AV Nodal Ablation, Pacemaker, Chronic O2 @2L , MI, Moderate Mitral Regurgitation, Lung Cancer s/p radiation 2015, Hyperlipidemia, GERD, Cushing's Disease, Depression, Pericardial Effusion, Chronic Diastolic CHF, CAD, COPD, CKD stage IV (baseline creatinine 2.06, GFR 22), Asthma, Arthritis, and Anemia. She presented to [facility] ER on 05/21 with worsening shortness of breath and productive cough onset of symptoms 2-3 days prior to presentation.  In the ER lab results revealed creatinine 2.12, BUN 45, BNP 1,235, troponin 0.13, lactic acid 1.3, hgb 9.1, pct 6.05, and cxr revealed pulmonary infiltrates/edema.  She received iv lasix and abx.  She was subsequently admitted to the telemetry unit by hospitalist team for further workup and treatment.  On 05/22 she developed worsening respiratory failure requiring Bipap and pt transferred to the stepdown unit.    PAST MEDICAL HISTORY :   has a past medical history of Anemia, Arthritis, Asthma, Cardiac resynchronization therapy pacemaker (CRT-P) in place, Chronic diastolic CHF (congestive heart failure) ([place]), CKD (chronic kidney disease), stage IV ([place]), COPD (chronic obstructive pulmonary disease) ([place]), Coronary artery disease, Cough, Cushing's disease ([place]),  Depression, Diverticulitis, Edema, GERD (gastroesophageal reflux disease), Gout, History of hiatal hernia, History of pneumonia, HLD (hyperlipidemia), HOH (hard of hearing), Hypertensive heart disease, Lung cancer ([place]) (dx'd 2014), Migraine, Mixed Ischemic and Nonischemic Cardiomyopathy ([place]), Moderate mitral regurgitation, Multiple allergies, Myocardial infarction ([place]), Oxygen deficiency, Persistent atrial fibrillation ([place]), Presence of permanent cardiac pacemaker, S/P AV nodal ablation, Sleep apnea, and Type II diabetes mellitus ([place]).  has a past surgical history that includes Adrenalectomy (Left, 1980's); Abdominal hysterectomy; Appendectomy; Cholecystectomy; Coronary angioplasty with stent (12/23/2014); Tonsillectomy; Fracture surgery; Wrist fracture surgery (Bilateral, ~ 2000); Breast cyst excision (Left); Tubal ligation; left heart catheterization with coronary angiogram (N/A, 12/23/2014); percutaneous coronary stent intervention (pci-s) (12/23/2014); Cataract extraction w/PHACO (Right, 12/28/2015); Cardiac catheterization (N/A, 03/08/2016); Cardiac catheterization (N/A, 03/07/2016); transthoracic echocardiogram (11/26/2015); Cardiac catheterization (N/A, 03/31/2016); Cardiac catheterization (N/A, 03/31/2016); Ablation; Insert / replace / remove pacemaker (02/2016); Cataract extraction w/PHACO (Left, 11/14/2016); Esophagogastroduodenoscopy (egd) with propofol (N/A, 01/26/2017); and Hip pinning, cannulated (Left, 02/01/2017). Prior to Admission medications   Medication Sig Start Date End Date Taking? Authorizing Provider  ALPRAZolam Duanne Moron) 0.5 MG tablet Take 0.5 mg by mouth at bedtime. 12/27/16  Yes [provider]  atorvastatin (LIPITOR) 20 MG tablet Take 20 mg by mouth daily.   Yes [provider]  busPIRone (BUSPAR) 7.5 MG tablet Take 7.5 mg by mouth 2 (two) times daily.  03/13/17 03/13/18 Yes [provider]  feeding supplement, ENSURE ENLIVE, (ENSURE ENLIVE) LIQD Take 237 mLs by mouth 3  (three) times daily with meals. 03/02/17  Yes Fritzi Mandes, MD  fenofibrate (TRICOR) 48 MG tablet Take 48 mg by mouth every evening.    Yes [provider]  fluticasone furoate-vilanterol (BREO ELLIPTA) 100-25 MCG/INH AEPB Inhale 1  puff into the lungs daily as needed (shortness of breath).    Yes [provider]  metoprolol succinate (TOPROL XL) 25 MG 24 hr tablet Take 25 mg by mouth daily.  12/19/16  Yes Minna Merritts, MD  morphine 10 MG/5ML solution Take 5 mg by mouth daily as needed for severe pain.   Yes [provider]  nitroGLYCERIN (NITROSTAT) 0.4 MG SL tablet Place 1 tablet (0.4 mg total) under the tongue every 5 (five) minutes as needed for chest pain. 12/24/14  Yes Barrett, Evelene Croon, PA-C  pantoprazole (PROTONIX) 40 MG tablet Take 40 mg by mouth daily as needed (heartburn).    Yes [provider]  sertraline (ZOLOFT) 50 MG tablet Take 1 tablet (50 mg total) by mouth daily. 03/24/16  Yes Gladstone Lighter, MD  tiotropium (SPIRIVA) 18 MCG inhalation capsule Place 18 mcg into inhaler and inhale daily as needed (shortness of breath).    Yes [provider]  torsemide (DEMADEX) 20 MG tablet Take 2 tablets (40 mg total) by mouth daily. Patient taking differently: Take 30-40 mg by mouth daily. Dose depending upon fluid retention. 03/02/17  Yes Fritzi Mandes, MD  levalbuterol Penne Lash) 1.25 MG/0.5ML nebulizer solution Take 1.25 mg by nebulization every 6 (six) hours as needed for wheezing or shortness of breath. Patient not taking: Reported on 01/15/2018 03/02/17   Fritzi Mandes, MD   Allergies  Allergen Reactions  . Ciprofloxacin Shortness Of Breath, Itching and Rash  . Doxycycline Shortness Of Breath, Itching and Rash  . Penicillins Shortness Of Breath, Itching, Rash and Other (See Comments)    Has patient had a PCN reaction causing immediate rash, facial/tongue/throat swelling, SOB or lightheadedness with hypotension: Yes Has patient had a PCN reaction  causing severe rash involving mucus membranes or skin necrosis: No Has patient had a PCN reaction that required hospitalization No Has patient had a PCN reaction occurring within the last 10 years: No If all of the above answers are "NO", then may proceed with Cephalosporin use.  . Sulfa Antibiotics Shortness Of Breath, Itching and Rash  . Latex Itching  . Morphine And Related Itching  . Albuterol Sulfate   . Macrobid [Nitrofurantoin Macrocrystal] Itching  . Cefuroxime Rash    Blisters in mouth    FAMILY HISTORY:  family history includes COPD in her brother; Diabetes in her mother; Heart disease in her father and mother; Hypertension in her father and mother; Osteoarthritis in her mother. SOCIAL HISTORY:  reports that she quit smoking about 23 years ago. Her smoking use included cigarettes. She has a 45.00 pack-year smoking history. She has never used smokeless tobacco. She reports that she does not drink alcohol or use drugs.  REVIEW OF SYSTEMS: Positives in BOLD  Constitutional: Negative for fever, chills, weight loss, malaise/fatigue and diaphoresis.  HENT: Negative for hearing loss, ear pain, nosebleeds, congestion, sore throat, neck pain, tinnitus and ear discharge.   Eyes: Negative for blurred vision, double vision, photophobia, pain, discharge and redness.  Respiratory: cough, hemoptysis, sputum production, shortness of breath, wheezing and stridor.   Cardiovascular: Negative for chest pain, palpitations, orthopnea, claudication, leg swelling and PND.  Gastrointestinal: Negative for heartburn, nausea, vomiting, abdominal pain, diarrhea, constipation, blood in stool and melena.  Genitourinary: Negative for dysuria, urgency, frequency, hematuria and flank pain.  Musculoskeletal: Negative for myalgias, back pain, joint pain and falls.  Skin: Negative for itching and rash.  Neurological: Negative for dizziness, tingling, tremors, sensory change, speech change, focal weakness, seizures,  loss of  consciousness, weakness and headaches.  Endo/Heme/Allergies: Negative for environmental allergies and polydipsia. Does not bruise/bleed easily.  SUBJECTIVE:  No complaints at this time   VITAL SIGNS: Temp:  [98.2 F (36.8 C)-100.1 F (37.8 C)] 98.2 F (36.8 C) (05/21 1936) Pulse Rate:  [69-73] 70 (05/21 1936) Resp:  [14-30] 24 (05/21 2231) BP: (138-168)/(43-78) 147/45 (05/21 1936) SpO2:  [67 %-96 %] 93 % (05/21 2231) Weight:  [86.6 kg (191 lb)-90.4 kg (199 lb 6.4 oz)] 90.4 kg (199 lb 6.4 oz) (05/21 1526)  PHYSICAL EXAMINATION: General: chronically ill appearing female, NAD on Bipap  Neuro: lethargic, oriented, follows commands, PERRL HEENT: supple, no JVD  Cardiovascular: paced, no R/G Lungs: expiratory wheezes LUL diminished throughout all other lobes, even, non labored  Abdomen: +BS x4, soft, obese, non tender, non distended  Musculoskeletal: 2+ bilateral lower extremity edema  Skin: intact no rashes or lesions  Recent Labs  Lab 01/15/18 1203  NA 138  K 3.5  CL 89*  CO2 33*  BUN 45*  CREATININE 2.12*  GLUCOSE 203*   Recent Labs  Lab 01/15/18 1203  HGB 9.1*  HCT 27.4*  WBC 9.6  PLT 143*   Dg Chest 2 View  Result Date: 01/15/2018 CLINICAL DATA:  Shortness of breath. EXAM: CHEST - 2 VIEW COMPARISON:  02/27/2017. FINDINGS: AICD noted stable position. Cardiomegaly with diffuse bilateral from interstitial prominence and left-sided pleural effusion. Findings consistent with CHF. Atelectatic changes noted left lung base. No pneumothorax. IMPRESSION: AICD in stable position. Cardiomegaly with diffuse bilateral pulmonary infiltrates/edema and left-sided pleural effusion. Findings most consistent CHF. Electronically Signed   By: Marcello Moores  Register   On: 01/15/2018 11:35   Dg Chest Port 1 View  Result Date: 01/16/2018 CLINICAL DATA:  Cough EXAM: PORTABLE CHEST 1 VIEW COMPARISON:  01/15/2018, 02/27/2017, 01/31/2017 FINDINGS: Left-sided pacing device as before.  Cardiomegaly with vascular congestion and aortic atherosclerosis. Patchy multifocal left greater than right airspace disease without significant interval change. Probable small pleural effusions. No pneumothorax. IMPRESSION: 1. Cardiomegaly with vascular congestion. 2. Patchy multifocal airspace disease, some of which is slightly nodular in configuration, possible multifocal pneumonia. Increasing consolidation at the left lung base. Small pleural effusions. Electronically Signed   By: Donavan Foil M.D.   On: 01/16/2018 01:08    ASSESSMENT / PLAN: Acute on chronic hypercapnic respiratory failure secondary to pneumonia and CHF exacerbation  Acute on chronic renal failure  Elevated troponin's likely demand ischemia in setting of respiratory failure  Anemia without obvious acute blood loss  Thrombocytopenia  Hx: Diabetes Mellitus, Permanent Pacemaker, Persistent Atrial Fibrillation s/p AV nodal ablation, OSA, Lung Cancer, and Asthma  P: Prn Bipap for dyspnea and/or hypoxia  Will change spiriva and breo ellipta to daily for now  Continuous telemetry monitoring Trend troponin's  IV lasix  Echo pending  Cardiology consulted appreciate input  Trend BMP  Replace electrolytes as indicated  Monitor UOP Trend WBC and monitor fever curve  Follow cultures Continue current abx  VTE px: subq heparin  Trend CBC  Monitor for s/sx of bleeding and transfuse for hgb <8  Marda Stalker, [place] Pager [phone] (please enter 7 digits) PCCM Consult Pager [phone] (please enter 7 digits)

## 2018-01-17 DIAGNOSIS — I272 Pulmonary hypertension, unspecified: Secondary | ICD-10-CM

## 2018-01-17 LAB — BASIC METABOLIC PANEL
ANION GAP: 12 (ref 5–15)
BUN: 71 mg/dL — ABNORMAL HIGH (ref 6–20)
CHLORIDE: 91 mmol/L — AB (ref 101–111)
CO2: 35 mmol/L — AB (ref 22–32)
Calcium: 8 mg/dL — ABNORMAL LOW (ref 8.9–10.3)
Creatinine, Ser: 2.54 mg/dL — ABNORMAL HIGH (ref 0.44–1.00)
GFR calc Af Amer: 19 mL/min — ABNORMAL LOW (ref >60)
GFR calc non Af Amer: 17 mL/min — ABNORMAL LOW (ref >60)
Glucose, Bld: 147 mg/dL — ABNORMAL HIGH (ref 65–99)
Potassium: 4.4 mmol/L (ref 3.5–5.1)
Sodium: 138 mmol/L (ref 135–145)

## 2018-01-17 LAB — URINE CULTURE: CULTURE: NO GROWTH

## 2018-01-17 LAB — PHOSPHORUS: PHOSPHORUS: 4.1 mg/dL (ref 2.5–4.6)

## 2018-01-17 LAB — MAGNESIUM: MAGNESIUM: 2.1 mg/dL (ref 1.7–2.4)

## 2018-01-17 MED ORDER — POLYETHYLENE GLYCOL 3350 17 G PO PACK
17.0000 g | PACK | Freq: Every day | ORAL | Status: DC
  Administered 2018-01-17 – 2018-01-21 (×3): 17 g via ORAL
  Filled 2018-01-17 (×6): qty 1

## 2018-01-17 MED ORDER — SODIUM CHLORIDE 0.9 % IV SOLN
1.0000 g | Freq: Every day | INTRAVENOUS | Status: DC
  Administered 2018-01-17 – 2018-01-19 (×3): 1 g via INTRAVENOUS
  Filled 2018-01-17: qty 1
  Filled 2018-01-17 (×2): qty 10

## 2018-01-17 MED ORDER — METHYLPREDNISOLONE SODIUM SUCC 40 MG IJ SOLR
40.0000 mg | Freq: Two times a day (BID) | INTRAMUSCULAR | Status: DC
  Administered 2018-01-17 – 2018-01-19 (×5): 40 mg via INTRAVENOUS
  Filled 2018-01-17 (×5): qty 1

## 2018-01-17 MED ORDER — PREMIER PROTEIN SHAKE
11.0000 [oz_av] | Freq: Two times a day (BID) | ORAL | Status: DC
  Administered 2018-01-17 – 2018-01-22 (×10): 11 [oz_av] via ORAL

## 2018-01-17 NOTE — Progress Notes (Signed)
Pharmacy Antibiotic Note  Regina Burke is a 84 y.o. female admitted on 01/15/2018 with pneumonia. Pharmacy has been consulted for Ceftriaxone dosing. Patient has several abx allergies documented. Tolerated Ceftriaxone on admission in September 2016.  Plan: Continue ceftriaxone 1 g IV daily.  Will continue azithromycin 500 mg PO daily for total of days of therapy.   Height: 5\' 4"  (162.6 cm) Weight: 199 lb 4.7 oz (90.4 kg) IBW/kg (Calculated) : 54.7  Temp (24hrs), Avg:98.4 F (36.9 C), Min:97.9 F (36.6 C), Max:99.1 F (37.3 C)  Recent Labs  Lab 01/15/18 1203 01/15/18 1542 01/16/18 0509 01/16/18 0817 01/17/18 0306  WBC 9.6  --  7.8  --   --   CREATININE 2.12*  --   --  2.47* 2.54*  LATICACIDVEN 1.3 1.7  --   --   --     Estimated Creatinine Clearance: 18.6 mL/min (A) (by C-G formula based on SCr of 2.54 mg/dL (H)).    Allergies  Allergen Reactions  . Ciprofloxacin Shortness Of Breath, Itching and Rash  . Doxycycline Shortness Of Breath, Itching and Rash  . Penicillins Shortness Of Breath, Itching, Rash and Other (See Comments)    Has patient had a PCN reaction causing immediate rash, facial/tongue/throat swelling, SOB or lightheadedness with hypotension: Yes Has patient had a PCN reaction causing severe rash involving mucus membranes or skin necrosis: No Has patient had a PCN reaction that required hospitalization No Has patient had a PCN reaction occurring within the last 10 years: No If all of the above answers are "NO", then may proceed with Cephalosporin use.  . Sulfa Antibiotics Shortness Of Breath, Itching and Rash  . Latex Itching  . Morphine And Related Itching  . Albuterol Sulfate   . Macrobid [Nitrofurantoin Macrocrystal] Itching  . Cefuroxime Rash    Blisters in mouth    Antimicrobials this admission: Meropenem 5/21 >> 5/21 Azithromycin 5/21 >> 5/25 Ceftriaxone 5/22 >> 5/27  Dose adjustments this admission: 5/22 Azithromycin transitioned to  PO.  Microbiology results: 5/21 BCx: no growth x 2 days 5/21 UCx: no growth 5/22 MRSA PCR: negative  Thank you for allowing pharmacy to be a part of this patient's care.  Simpson,Michael L 01/17/2018 1:09 PM

## 2018-01-17 NOTE — Progress Notes (Signed)
[facility] [place] NAME: Makayli Bracken    MR#:  [id number]  DATE OF BIRTH:  [birth year]  SUBJECTIVE:   Patient came in with increasing shortness of breath. She is still short winded. Trying to eat food. She is now nasal cannula oxygen. REVIEW OF SYSTEMS:   Review of Systems  Constitutional: Negative for chills, fever and weight loss.  HENT: Negative for ear discharge, ear pain and nosebleeds.   Eyes: Negative for blurred vision, pain and discharge.  Respiratory: Positive for cough and shortness of breath. Negative for sputum production, wheezing and stridor.   Cardiovascular: Positive for orthopnea. Negative for chest pain, palpitations and PND.  Gastrointestinal: Negative for abdominal pain, diarrhea, nausea and vomiting.  Genitourinary: Negative for frequency and urgency.  Musculoskeletal: Negative for back pain and joint pain.  Neurological: Positive for weakness. Negative for sensory change, speech change and focal weakness.  Psychiatric/Behavioral: Negative for depression and hallucinations. The patient is not nervous/anxious.    Tolerating Diet:yes Tolerating PT: pending  DRUG ALLERGIES:   Allergies  Allergen Reactions  . Ciprofloxacin Shortness Of Breath, Itching and Rash  . Doxycycline Shortness Of Breath, Itching and Rash  . Penicillins Shortness Of Breath, Itching, Rash and Other (See Comments)    Has patient had a PCN reaction causing immediate rash, facial/tongue/throat swelling, SOB or lightheadedness with hypotension: Yes Has patient had a PCN reaction causing severe rash involving mucus membranes or skin necrosis: No Has patient had a PCN reaction that required hospitalization No Has patient had a PCN reaction occurring within the last 10 years: No If all of the above answers are "NO", then may proceed with Cephalosporin use.  . Sulfa Antibiotics Shortness Of Breath, Itching and Rash  . Latex Itching  .  Morphine And Related Itching  . Albuterol Sulfate   . Macrobid [Nitrofurantoin Macrocrystal] Itching  . Cefuroxime Rash    Blisters in mouth    VITALS:  Blood pressure 135/64, pulse 69, temperature 99.1 F (37.3 C), temperature source Oral, resp. rate (!) 29, height 5\' 4"  (1.626 m), weight 90.4 kg (199 lb 4.7 oz), SpO2 95 %.  PHYSICAL EXAMINATION:   Physical Exam  GENERAL:  84 y.o.-year-old patient lying in the bed with no acute distress.  EYES: Pupils equal, round, reactive to light and accommodation. No scleral icterus. Extraocular muscles intact.  HEENT: Head atraumatic, normocephalic. Oropharynx and nasopharynx clear.  NECK:  Supple, no jugular venous distention. No thyroid enlargement, no tenderness.  LUNGS: decreased breath sounds bilaterally, no wheezing, rales, rhonchi. No use of accessory muscles of respiration.  CARDIOVASCULAR: S1, S2 normal. No murmurs, rubs, or gallops.  ABDOMEN: Soft, nontender, nondistended. Bowel sounds present. No organomegaly or mass.  EXTREMITIES: No cyanosis, clubbing  ++edema b/l.    NEUROLOGIC: Cranial nerves II through XII are intact. No focal Motor or sensory deficits b/l.   PSYCHIATRIC:  patient is alert and oriented x 3.  SKIN: No obvious rash, lesion, or ulcer.   LABORATORY PANEL:  CBC Recent Labs  Lab 01/16/18 0509  WBC 7.8  HGB 8.8*  HCT 26.1*  PLT 148*    Chemistries  Recent Labs  Lab 01/15/18 1203  01/17/18 0306  NA 138   < > 138  K 3.5   < > 4.4  CL 89*   < > 91*  CO2 33*   < > 35*  GLUCOSE 203*   < > 147*  BUN 45*   < > 71*  CREATININE 2.12*   < > 2.54*  CALCIUM 8.8*   < > 8.0*  MG  --   --  2.1  AST 33  --   --   ALT 14  --   --   ALKPHOS 45  --   --   BILITOT 1.8*  --   --    < > = values in this interval not displayed.   Cardiac Enzymes Recent Labs  Lab 01/16/18 0817  TROPONINI 0.12*   RADIOLOGY:  Dg Chest Port 1 View  Result Date: 01/16/2018 CLINICAL DATA:  Cough EXAM: PORTABLE CHEST 1 VIEW  COMPARISON:  01/15/2018, 02/27/2017, 01/31/2017 FINDINGS: Left-sided pacing device as before. Cardiomegaly with vascular congestion and aortic atherosclerosis. Patchy multifocal left greater than right airspace disease without significant interval change. Probable small pleural effusions. No pneumothorax. IMPRESSION: 1. Cardiomegaly with vascular congestion. 2. Patchy multifocal airspace disease, some of which is slightly nodular in configuration, possible multifocal pneumonia. Increasing consolidation at the left lung base. Small pleural effusions. Electronically Signed   By: Donavan Foil M.D.   On: 01/16/2018 01:08   ASSESSMENT AND PLAN:   Juri Dinning  is a 84 y.o. female with a known history of chronic diastolic heart failure pEF, CAD and chronic hypoxic respiratory failure due to COPD who presents today to the emergency room due to shortness of breath.  Patient reports over the past 2 weeks she has had increasing shortness of breath, lower extremity edema, PND and orthopnea.  She also states she has had a productive cough.  She wears 2 L of oxygen at home today was increased to 3 L.  1.  Acute on chronic hypoxic respiratory failure in the setting of acute on chronic diastolic heart failure and pneumonia Wean oxygen to baseline 2 L as tolerated.  2.  Acute on chronic diastolic heart failure with preserved ejection fraction: Continue Lasix IV 60 mg BID--now on hold on 5/23 -creat rising CHF clinic upon discharge Consultation with [facility] cardiology appreciated. -?possibilty of Right Heart cath to see wedge pressures  3.  Pneumonia: Patient has multiple allergies As per pharmacy we will continue azithromycin and Rocephin Pro calcitonin 6.05  4.  Acute  chronic kidney disease stage III:  -This is likely due to prerenal azotemia from acute diastolic heart failure on chronic. -creat 2.47--2.54--hold lasix  5.  Chronic hypoxic respiratory failure with COPD: No signs of exacerbation  6.   CAD: Continue statin and metoprolol  7.  Elevated troponin: This may be due to demand ischemia Continue to monitor troponins and follow on telemetry follow cardiology recommendations    Case discussed with Care Management/Social Worker. Management plans discussed with the patient, family and they are in agreement.  CODE STATUS: full  DVT Prophylaxis: heparin  TOTAL TIME TAKING CARE OF THIS PATIENT: *25* minutes.  >50% time spent on counselling and coordination of care  POSSIBLE D/C IN *few* DAYS, DEPENDING ON CLINICAL CONDITION.  Note: This dictation was prepared with Dragon dictation along with smaller phrase technology. Any transcriptional errors that result from this process are unintentional.  Fritzi Mandes M.D on 01/17/2018 at 2:16 PM  Between 7am to 6pm - Pager - [phone]  After 6pm go to [email or web address] - Proofreader  [facility]  Office  [phone]  CC: Primary care physician; Tracie Harrier, MDPatient ID: Sela Hilding, female   DOB: 12-17-1932, 84 y.o.   MRN: [id number]

## 2018-01-17 NOTE — Progress Notes (Signed)
[facility], [place] 01/17/18  Subjective:   Patient known to our practice from previous admission This time she presents for cough with thick gray sputum and some shortness of breath Patient has significant history of smoking Admitted for management of respiratory distress  Objective:  Vital signs in last 24 hours:  Temp:  [97.9 F (36.6 C)-99.1 F (37.3 C)] 99.1 F (37.3 C) (05/23 0835) Pulse Rate:  [69-70] 69 (05/23 0835) Resp:  [15-31] 29 (05/23 0835) BP: (105-140)/(36-53) 136/42 (05/23 0835) SpO2:  [92 %-100 %] 94 % (05/23 0835) FiO2 (%):  [35 %] 35 % (05/22 2200)  Weight change:  Filed Weights   01/15/18 1526 01/16/18 0330 01/16/18 0400  Weight: 90.4 kg (199 lb 6.4 oz) 90.4 kg (199 lb 4.8 oz) 90.4 kg (199 lb 4.7 oz)    Intake/Output:    Intake/Output Summary (Last 24 hours) at 01/17/2018 0846 Last data filed at 01/17/2018 0600 Gross per 24 hour  Intake 720 ml  Output 455 ml  Net 265 ml     Physical Exam: General: Laying in the bed, no acute distress  HEENT Dry oral mucous membranes  Neck supple  Pulm/lungs left greater than right basilar crackles  CVS/Heart irregular rhythm  Abdomen:  Soft, nontender, nondistended  Extremities: No edema  Neurologic: Alert, oriented, able to answer questions  Skin: warm, dry          Basic Metabolic Panel:  Recent Labs  Lab 01/15/18 1203 01/16/18 0817 01/17/18 0306  NA 138 138 138  K 3.5 3.8 4.4  CL 89* 90* 91*  CO2 33* 36* 35*  GLUCOSE 203* 153* 147*  BUN 45* 55* 71*  CREATININE 2.12* 2.47* 2.54*  CALCIUM 8.8* 8.4* 8.0*  MG  --   --  2.1  PHOS  --   --  4.1     CBC: Recent Labs  Lab 01/15/18 1203 01/16/18 0509  WBC 9.6 7.8  NEUTROABS 8.8*  --   HGB 9.1* 8.8*  HCT 27.4* 26.1*  MCV 86.1 86.9  PLT 143* 148*      Lab Results  Component Value Date   HEPBSAG Negative 01/26/2017   HEPBSAB Non Reactive 01/26/2017      Microbiology:  Recent Results (from the past  240 hour(s))  Culture, blood (routine x 2)     Status: None (Preliminary result)   Collection Time: 01/15/18 12:04 PM  Result Value Ref Range Status   Specimen Description BLOOD LEFT HAND  Final   Special Requests BOTTLES DRAWN AEROBIC AND ANAEROBIC BCAV  Final   Culture   Final    NO GROWTH 2 DAYS Performed at [facility], [address] [place], [place] [address]    Report Status PENDING  Incomplete  Culture, blood (routine x 2)     Status: None (Preliminary result)   Collection Time: 01/15/18 12:04 PM  Result Value Ref Range Status   Specimen Description BLOOD LEFT FA  Final   Special Requests   Final    BOTTLES DRAWN AEROBIC AND ANAEROBIC Blood Culture adequate volume   Culture   Final    NO GROWTH 2 DAYS Performed at [facility], [address] [place], [place] [address]    Report Status PENDING  Incomplete  MRSA PCR Screening     Status: None   Collection Time: 01/16/18  4:30 AM  Result Value Ref Range Status   MRSA by PCR NEGATIVE NEGATIVE Final    Comment:  The GeneXpert MRSA Assay (FDA approved for NASAL specimens only), is one component of a comprehensive MRSA colonization surveillance program. It is not intended to diagnose MRSA infection nor to guide or monitor treatment for MRSA infections. Performed at [facility], [address] [place], [place] [address]     Coagulation Studies: No results for input(s): LABPROT, INR in the last 72 hours.  Urinalysis: Recent Labs    01/15/18 0430  COLORURINE AMBER*  LABSPEC 1.015  PHURINE 5.0  GLUCOSEU NEGATIVE  HGBUR SMALL*  BILIRUBINUR NEGATIVE  KETONESUR NEGATIVE  PROTEINUR NEGATIVE  NITRITE NEGATIVE  LEUKOCYTESUR NEGATIVE      Imaging: Dg Chest 2 View  Result Date: 01/15/2018 CLINICAL DATA:  Shortness of breath. EXAM: CHEST - 2 VIEW COMPARISON:  02/27/2017. FINDINGS: AICD noted stable position. Cardiomegaly with diffuse bilateral from interstitial prominence  and left-sided pleural effusion. Findings consistent with CHF. Atelectatic changes noted left lung base. No pneumothorax. IMPRESSION: AICD in stable position. Cardiomegaly with diffuse bilateral pulmonary infiltrates/edema and left-sided pleural effusion. Findings most consistent CHF. Electronically Signed   By: Marcello Moores  Register   On: 01/15/2018 11:35   Dg Chest Port 1 View  Result Date: 01/16/2018 CLINICAL DATA:  Cough EXAM: PORTABLE CHEST 1 VIEW COMPARISON:  01/15/2018, 02/27/2017, 01/31/2017 FINDINGS: Left-sided pacing device as before. Cardiomegaly with vascular congestion and aortic atherosclerosis. Patchy multifocal left greater than right airspace disease without significant interval change. Probable small pleural effusions. No pneumothorax. IMPRESSION: 1. Cardiomegaly with vascular congestion. 2. Patchy multifocal airspace disease, some of which is slightly nodular in configuration, possible multifocal pneumonia. Increasing consolidation at the left lung base. Small pleural effusions. Electronically Signed   By: Donavan Foil M.D.   On: 01/16/2018 01:08     Medications:   . sodium chloride    . cefTRIAXone (ROCEPHIN)  IV     . ALPRAZolam  0.5 mg Oral QHS  . atorvastatin  20 mg Oral Daily  . azithromycin  500 mg Oral Daily  . budesonide (PULMICORT) nebulizer solution  0.5 mg Nebulization BID  . busPIRone  7.5 mg Oral BID  . chlorhexidine  15 mL Mouth Rinse BID  . fenofibrate  54 mg Oral Daily  . furosemide  60 mg Intravenous BID  . heparin  5,000 Units Subcutaneous Q8H  . ipratropium-albuterol  3 mL Nebulization Q4H  . mouth rinse  15 mL Mouth Rinse q12n4p  . metoprolol succinate  25 mg Oral Daily  . sertraline  50 mg Oral Daily  . sodium chloride flush  3 mL Intravenous Q12H   sodium chloride, acetaminophen **OR** acetaminophen, bisacodyl, ibuprofen, levalbuterol, nitroGLYCERIN, ondansetron **OR** ondansetron (ZOFRAN) IV, pantoprazole, polyethylene glycol, sodium chloride  flush  Assessment/ Plan:  84 y.o. female with COPD, chronic diastolic CHF, pacemaker, chronic kidney disease, coronary disease with history of non-STEMI in April 2016, drug-eluting stent  1.  Acute renal failure, chronic kidney disease stage IV Baseline creatinine appears to be 2.12/GFR 21 from may 20/08/2017 Creatinine has worsened to 2.54/GFR 71 Acute renal failure is likely secondary to ATN from concurrent illness/pneumonia Volume  status appear to be dry Plan: Hold furosemide Follow volume status closely   LOS: 2 Floy Angert Candiss Norse 5/23/20198:46 AM  [place], [place]  Note: This note was prepared with Dragon dictation. Any transcription errors are unintentional

## 2018-01-17 NOTE — Progress Notes (Signed)
   CHIEF COMPLAINT:   Chief Complaint  Patient presents with  . Respiratory Distress    Subjective  Severe SOB Increased WOB +fluid overload    Objective   Examination: PHYSICAL EXAMINATION:  GENERAL:critically ill appearing, +resp distress HEAD: Normocephalic, atraumatic.  EYES: Pupils equal, round, reactive to light.  No scleral icterus.  MOUTH: Moist mucosal membrane. NECK: Supple. No thyromegaly. No nodules. No JVD.  PULMONARY: +rhonchi, +wheezing CARDIOVASCULAR: S1 and S2. Regular rate and rhythm. No murmurs, rubs, or gallops.  GASTROINTESTINAL: Soft, nontender, -distended. No masses. Positive bowel sounds. No hepatosplenomegaly.  MUSCULOSKELETAL: + edema.  NEUROLOGIC: alert and awake SKIN:intact,warm,dry    VITALS:  height is 5\' 4"  (1.626 m) and weight is 199 lb 4.7 oz (90.4 kg). Her oral temperature is 98.2 F (36.8 C). Her blood pressure is 140/43 (abnormal) and her pulse is 70. Her respiration is 30 (abnormal) and oxygen saturation is 95%.   I personally reviewed Labs under Results section.  Radiology Reports Dg Chest 2 View  Result Date: 01/15/2018 CLINICAL DATA:  Shortness of breath. EXAM: CHEST - 2 VIEW COMPARISON:  02/27/2017. FINDINGS: AICD noted stable position. Cardiomegaly with diffuse bilateral from interstitial prominence and left-sided pleural effusion. Findings consistent with CHF. Atelectatic changes noted left lung base. No pneumothorax. IMPRESSION: AICD in stable position. Cardiomegaly with diffuse bilateral pulmonary infiltrates/edema and left-sided pleural effusion. Findings most consistent CHF. Electronically Signed   By: Marcello Moores  Register   On: 01/15/2018 11:35   Dg Chest Port 1 View  Result Date: 01/16/2018 CLINICAL DATA:  Cough EXAM: PORTABLE CHEST 1 VIEW COMPARISON:  01/15/2018, 02/27/2017, 01/31/2017 FINDINGS: Left-sided pacing device as before. Cardiomegaly with vascular congestion and aortic atherosclerosis. Patchy multifocal left greater  than right airspace disease without significant interval change. Probable small pleural effusions. No pneumothorax. IMPRESSION: 1. Cardiomegaly with vascular congestion. 2. Patchy multifocal airspace disease, some of which is slightly nodular in configuration, possible multifocal pneumonia. Increasing consolidation at the left lung base. Small pleural effusions. Electronically Signed   By: Donavan Foil M.D.   On: 01/16/2018 01:08       ASSESSMENT / PLAN: Severe resp failure with Acute on chronic hypercapnic respiratory failure secondary to pneumonia and CHF exacerbation  Acute on chronic renal failure -progressive cardio-renal syndrome  Will use biPAP as needed Oxygen as needed Will discuss case with nephrology-lasix drip vs dialysis Follow up cariology recs  Recommend Palliative care consult Recommend DNR/DNI  Status    Critical Care Time devoted to patient care services described in this note is 32 minutes.   Overall, patient is critically ill, prognosis is guarded.  Patient with Multiorgan failure and at high risk for cardiac arrest and death.    Corrin Parker, M.D.  [facility]  Medical Director [place] Director [facility] Cardio-Pulmonary Department

## 2018-01-17 NOTE — Progress Notes (Signed)
Palliative Note:  Spoke with son, Regina Burke. Goals of care meeting has been scheduled for 5/24 @ 1300. His brother Regina Burke is unable to meet today. He is attempting to change his schedule and meet tomorrow morning or even later this afternoon. He will notify me if able to meet sooner. Regina Burke reports he is the [employer] and main caregiver however he would like his brother to be present during the meeting, as he also helps with her care and decisions. Detailed note and recommendations to follow after meeting.   Thank you for your referral.   Regina Lea, NP-BC Palliative Medicine Team  Phone: [phone] Fax: [phone]

## 2018-01-17 NOTE — Plan of Care (Signed)

## 2018-01-17 NOTE — Progress Notes (Signed)
PT Cancellation Note  Patient Details Name: Regina Burke MRN: [id number] DOB: 03/16/1933   Cancelled Treatment:    Reason Eval/Treat Not Completed: Other (comment). Pt is more awake this date, however still reports increased SOB symptoms and doesn't feel she is able to participate in therapy. Heavy encouragement given by son and therapist to participate as tolerated, however pt reports she feels better waiting until tomorrow. Agreeable to chair transfer and there-ex next date. Will see tomorrow.    [name] 01/17/2018, 11:24 AM  Greggory Stallion, PT, DPT [phone]

## 2018-01-17 NOTE — Progress Notes (Signed)
Progress Note  Patient Name: Regina Burke Date of Encounter: 01/17/2018  Primary Cardiologist: [facility]  Subjective   Mild improvement in breathing less distress but reports that she does not feel well overall,  Poor appetite, drinking a little bit, not eating very much Denies constipation Very mild cough, Sometimes productive  Inpatient Medications    Scheduled Meds: . ALPRAZolam  0.5 mg Oral QHS  . atorvastatin  20 mg Oral Daily  . azithromycin  500 mg Oral Daily  . budesonide (PULMICORT) nebulizer solution  0.5 mg Nebulization BID  . busPIRone  7.5 mg Oral BID  . chlorhexidine  15 mL Mouth Rinse BID  . fenofibrate  54 mg Oral Daily  . heparin  5,000 Units Subcutaneous Q8H  . ipratropium-albuterol  3 mL Nebulization Q4H  . mouth rinse  15 mL Mouth Rinse q12n4p  . methylPREDNISolone (SOLU-MEDROL) injection  40 mg Intravenous BID  . metoprolol succinate  25 mg Oral Daily  . polyethylene glycol  17 g Oral Daily  . sertraline  50 mg Oral Daily  . sodium chloride flush  3 mL Intravenous Q12H   Continuous Infusions: . sodium chloride    . cefTRIAXone (ROCEPHIN)  IV 1 g (01/17/18 0951)   PRN Meds: sodium chloride, acetaminophen **OR** acetaminophen, bisacodyl, ibuprofen, levalbuterol, nitroGLYCERIN, ondansetron **OR** ondansetron (ZOFRAN) IV, pantoprazole, sodium chloride flush   Vital Signs    Vitals:   01/17/18 0600 01/17/18 0700 01/17/18 0835 01/17/18 0900  BP: (!) 140/43  (!) 136/42 (!) 89/57  Pulse: 70 70 69 68  Resp: (!) 28 (!) 30 (!) 29 (!) 30  Temp:   99.1 F (37.3 C)   TempSrc:   Oral   SpO2: 94% 95% 94% 95%  Weight:      Height:        Intake/Output Summary (Last 24 hours) at 01/17/2018 1017 Last data filed at 01/17/2018 0600 Gross per 24 hour  Intake 480 ml  Output 455 ml  Net 25 ml   Filed Weights   01/15/18 1526 01/16/18 0330 01/16/18 0400  Weight: 199 lb 6.4 oz (90.4 kg) 199 lb 4.8 oz (90.4 kg) 199 lb 4.7 oz (90.4 kg)    Telemetry      Atrial fib - Personally Reviewed  ECG     Physical Exam   GEN:  Mild respiratory distress, appears pale Neck:  Unable to estimate JVD Cardiac:  Irregularly irregular no murmurs, rubs, or gallops.  Respiratory:  Rales throughout bilaterally GI: Soft, nontender, non-distended  MS: No edema; No deformity. Neuro:  Nonfocal  Psych: Alert oriented communicative  Labs    Chemistry Recent Labs  Lab 01/15/18 1203 01/16/18 0817 01/17/18 0306  NA 138 138 138  K 3.5 3.8 4.4  CL 89* 90* 91*  CO2 33* 36* 35*  GLUCOSE 203* 153* 147*  BUN 45* 55* 71*  CREATININE 2.12* 2.47* 2.54*  CALCIUM 8.8* 8.4* 8.0*  PROT 7.4  --   --   ALBUMIN 3.7  --   --   AST 33  --   --   ALT 14  --   --   ALKPHOS 45  --   --   BILITOT 1.8*  --   --   GFRNONAA 21* 17* 17*  GFRAA 24* 20* 19*  ANIONGAP 16* 12 12     Hematology Recent Labs  Lab 01/15/18 1203 01/16/18 0509  WBC 9.6 7.8  RBC 3.18* 3.00*  HGB 9.1* 8.8*  HCT 27.4* 26.1*  MCV 86.1 86.9  MCH 28.6 29.2  MCHC 33.2 33.7  RDW 16.4* 16.0*  PLT 143* 148*    Cardiac Enzymes Recent Labs  Lab 01/15/18 1933 01/15/18 2118 01/16/18 0148 01/16/18 0817  TROPONINI 0.15* 0.16* 0.16* 0.12*   No results for input(s): TROPIPOC in the last 168 hours.   BNP Recent Labs  Lab 01/15/18 1203  BNP 1,235.0*     DDimer No results for input(s): DDIMER in the last 168 hours.   Radiology    Dg Chest 2 View  Result Date: 01/15/2018 CLINICAL DATA:  Shortness of breath. EXAM: CHEST - 2 VIEW COMPARISON:  02/27/2017. FINDINGS: AICD noted stable position. Cardiomegaly with diffuse bilateral from interstitial prominence and left-sided pleural effusion. Findings consistent with CHF. Atelectatic changes noted left lung base. No pneumothorax. IMPRESSION: AICD in stable position. Cardiomegaly with diffuse bilateral pulmonary infiltrates/edema and left-sided pleural effusion. Findings most consistent CHF. Electronically Signed   By: Marcello Moores  Register   On:  01/15/2018 11:35   Dg Chest Port 1 View  Result Date: 01/16/2018 CLINICAL DATA:  Cough EXAM: PORTABLE CHEST 1 VIEW COMPARISON:  01/15/2018, 02/27/2017, 01/31/2017 FINDINGS: Left-sided pacing device as before. Cardiomegaly with vascular congestion and aortic atherosclerosis. Patchy multifocal left greater than right airspace disease without significant interval change. Probable small pleural effusions. No pneumothorax. IMPRESSION: 1. Cardiomegaly with vascular congestion. 2. Patchy multifocal airspace disease, some of which is slightly nodular in configuration, possible multifocal pneumonia. Increasing consolidation at the left lung base. Small pleural effusions. Electronically Signed   By: Donavan Foil M.D.   On: 01/16/2018 01:08    Cardiac Studies   Echo - Left ventricle: The cavity size was normal. There was mild   concentric hypertrophy. Systolic function was normal. The   estimated ejection fraction was in the range of 55% to 60%. Wall   motion was normal; there were no regional wall motion   abnormalities. The study is not technically sufficient to allow   evaluation of LV diastolic function. - Aortic valve: There was very mild stenosis. Peak gradient (S): 20   mm Hg. - Left atrium: The atrium was moderately dilated. - Right atrium: The atrium was mildly dilated. - Tricuspid valve: There was moderate regurgitation. - Pulmonary arteries: Systolic pressure was moderate to severely   elevated. PA peak pressure: 64 mm Hg (S).   Patient Profile     Regina Burke is a 84 y.o. female With  COPD on chronic oxygen,  coronary artery diseasewith hospitalization April 2016 stent placed to marginal branch of the circumflex, Persistent atrial fibrillation,  03/31/2016, AV node ablation, Biventricular ICD implantation severe anemia, previous epo shot lung cancer, diabetes mellitus, episode of acute respiratory distress requiring hospitalization  Presenting to the hospital with  respiratory distress, atrial fibrillation, chronic diastolic CHF Possible pneumonia multifocal  Assessment & Plan    A/P: 1) acute respiratory distress Pulmonary hypertension on echocardiogram, left ventricular systolic pressure 75  In the setting of anemia, severe COPD Unable to exclude multifocal pneumonia Initially on bipap, weaned off.  Did receive Lasix yesterday p.m. -On broad-spectrum antibiotics, -Proceeding cautiously with diuretics given acute on chronic renal failure --If no significant improvement over the weekend, would consider right heart catheterization on Monday to estimate wedge pressure  2) Persistent Afib  s/p AV nodal ablation with CRT-D: rates well controlled On Toprol XL History of GI bleeding, not on anticoagulation  3) chronic anemia Followed by Dr. Vira Agar, Dr. Grayland Ormond  History of GI bleed Would guaiac stools,  check iron studies May need procrit  4) acute on chronic renal failure  Followed by nephrology May need right heart catheterization to evaluate pressures if no improvement  5) pulmonary hypertension Long history of smoking, also in the setting of possible pneumonia Unable to exclude component of diastolic CHF   Case discussed with hospitalist service, nephrology  Total encounter time more than 45 minutes  Greater than 50% was spent in counseling and coordination of care with the patient  For questions or updates, please contact [facility] Please consult [email or web address] for contact info under Cardiology/STEMI.      Signed, Ida Rogue, MD  01/17/2018, 10:17 AM

## 2018-01-17 NOTE — Progress Notes (Signed)
Pharmacy Electrolyte Monitoring Consult:  Pharmacy consulted to assist in monitoring and replacing electrolytes in this 84 y.o. female admitted on 01/15/2018 with Respiratory Distress  Furosemide discontinued on 5/23.   Labs:  Sodium (mmol/L)  Date Value  01/17/2018 138  04/13/2016 142  12/09/2014 134 (L)   Potassium (mmol/L)  Date Value  01/17/2018 4.4  12/09/2014 4.5   Magnesium (mg/dL)  Date Value  01/17/2018 2.1   Phosphorus (mg/dL)  Date Value  01/17/2018 4.1   Calcium (mg/dL)  Date Value  01/17/2018 8.0 (L)   Calcium, Total (mg/dL)  Date Value  12/09/2014 8.7 (L)   Albumin (g/dL)  Date Value  01/15/2018 3.7  12/09/2014 3.9    Assessment/Plan: No replacement warranted at this time. Will recheck all electrolytes with am labs.   Pharmacy will continue to monitor and adjust per consult.   Cameryn Schum L 01/17/2018 1:10 PM

## 2018-01-18 DIAGNOSIS — Z66 Do not resuscitate: Secondary | ICD-10-CM

## 2018-01-18 DIAGNOSIS — N184 Chronic kidney disease, stage 4 (severe): Secondary | ICD-10-CM

## 2018-01-18 DIAGNOSIS — J9622 Acute and chronic respiratory failure with hypercapnia: Secondary | ICD-10-CM

## 2018-01-18 DIAGNOSIS — J9621 Acute and chronic respiratory failure with hypoxia: Secondary | ICD-10-CM

## 2018-01-18 DIAGNOSIS — R0902 Hypoxemia: Secondary | ICD-10-CM

## 2018-01-18 DIAGNOSIS — Z515 Encounter for palliative care: Secondary | ICD-10-CM

## 2018-01-18 DIAGNOSIS — N179 Acute kidney failure, unspecified: Secondary | ICD-10-CM

## 2018-01-18 DIAGNOSIS — R05 Cough: Secondary | ICD-10-CM

## 2018-01-18 DIAGNOSIS — D631 Anemia in chronic kidney disease: Secondary | ICD-10-CM

## 2018-01-18 DIAGNOSIS — J189 Pneumonia, unspecified organism: Secondary | ICD-10-CM

## 2018-01-18 DIAGNOSIS — I482 Chronic atrial fibrillation: Secondary | ICD-10-CM

## 2018-01-18 DIAGNOSIS — Z7189 Other specified counseling: Secondary | ICD-10-CM

## 2018-01-18 LAB — GLUCOSE, CAPILLARY
GLUCOSE-CAPILLARY: 277 mg/dL — AB (ref 65–99)
Glucose-Capillary: 443 mg/dL — ABNORMAL HIGH (ref 65–99)
Glucose-Capillary: 376 mg/dL — ABNORMAL HIGH (ref 65–99)

## 2018-01-18 LAB — BASIC METABOLIC PANEL
ANION GAP: 15 (ref 5–15)
BUN: 90 mg/dL — ABNORMAL HIGH (ref 6–20)
CHLORIDE: 90 mmol/L — AB (ref 101–111)
CO2: 32 mmol/L (ref 22–32)
CREATININE: 2.32 mg/dL — AB (ref 0.44–1.00)
Calcium: 8 mg/dL — ABNORMAL LOW (ref 8.9–10.3)
GFR calc non Af Amer: 18 mL/min — ABNORMAL LOW (ref >60)
GFR, EST AFRICAN AMERICAN: 21 mL/min — AB (ref >60)
Glucose, Bld: 271 mg/dL — ABNORMAL HIGH (ref 65–99)
Potassium: 4.5 mmol/L (ref 3.5–5.1)
Sodium: 137 mmol/L (ref 135–145)

## 2018-01-18 LAB — BLOOD GAS, ARTERIAL
ACID-BASE EXCESS: 15.5 mmol/L — AB (ref 0.0–2.0)
BICARBONATE: 43.4 mmol/L — AB (ref 20.0–28.0)
FIO2: 28
O2 Saturation: 94.5 %
PATIENT TEMPERATURE: 37
pCO2 arterial: 70 mmHg (ref 32.0–48.0)
pH, Arterial: 7.4 (ref 7.350–7.450)
pO2, Arterial: 73 mmHg — ABNORMAL LOW (ref 83.0–108.0)

## 2018-01-18 LAB — CBC
HEMATOCRIT: 22.9 % — AB (ref 35.0–47.0)
Hemoglobin: 7.8 g/dL — ABNORMAL LOW (ref 12.0–16.0)
MCH: 29.5 pg (ref 26.0–34.0)
MCHC: 34.2 g/dL (ref 32.0–36.0)
MCV: 86.2 fL (ref 80.0–100.0)
Platelets: 125 10*3/uL — ABNORMAL LOW (ref 150–440)
RBC: 2.66 MIL/uL — ABNORMAL LOW (ref 3.80–5.20)
RDW: 15.8 % — AB (ref 11.5–14.5)
WBC: 3.4 10*3/uL — ABNORMAL LOW (ref 3.6–11.0)

## 2018-01-18 LAB — PHOSPHORUS: Phosphorus: 6.2 mg/dL — ABNORMAL HIGH (ref 2.5–4.6)

## 2018-01-18 LAB — ALBUMIN: Albumin: 2.9 g/dL — ABNORMAL LOW (ref 3.5–5.0)

## 2018-01-18 LAB — MAGNESIUM: Magnesium: 2.4 mg/dL (ref 1.7–2.4)

## 2018-01-18 LAB — TROPONIN I: Troponin I: 0.05 ng/mL (ref <0.03)

## 2018-01-18 MED ORDER — IPRATROPIUM-ALBUTEROL 0.5-2.5 (3) MG/3ML IN SOLN
3.0000 mL | Freq: Four times a day (QID) | RESPIRATORY_TRACT | Status: DC
  Administered 2018-01-18 – 2018-01-19 (×3): 3 mL via RESPIRATORY_TRACT
  Filled 2018-01-18 (×5): qty 3

## 2018-01-18 MED ORDER — INSULIN ASPART 100 UNIT/ML ~~LOC~~ SOLN
0.0000 [IU] | SUBCUTANEOUS | Status: DC
  Administered 2018-01-18: 7 [IU] via SUBCUTANEOUS
  Administered 2018-01-18: 3 [IU] via SUBCUTANEOUS
  Administered 2018-01-19: 5 [IU] via SUBCUTANEOUS
  Administered 2018-01-19: 3 [IU] via SUBCUTANEOUS
  Administered 2018-01-19: 5 [IU] via SUBCUTANEOUS
  Administered 2018-01-19: 3 [IU] via SUBCUTANEOUS
  Administered 2018-01-19: 5 [IU] via SUBCUTANEOUS
  Administered 2018-01-19: 3 [IU] via SUBCUTANEOUS
  Filled 2018-01-18 (×8): qty 1

## 2018-01-18 NOTE — Evaluation (Signed)
Physical Therapy Evaluation Patient Details Name: Regina Burke MRN: [id number] DOB: 17-Dec-1932 Today's Date: 01/18/2018   History of Present Illness  Pt admitted for respiratory failure with complaints of SOB and cough. Pt with COPD wearing 2L of O2 chronic, heart failure, and CAD.  Elevated troponin noted, however downtrending and per MD is due to demand ischemia. Pt is currently at 3L of O2 this date.   Clinical Impression  Pt is a pleasant 84 year old female who was admitted for respiratory failure with SOB symptoms and cough. Pt performs bed mobility, transfers, and ambulation with min assist +2 and RW. Pt fatigues quickly and de-sats with exertion down to 70%. Required increased O2 for improved sats to 91%. RN notified. Pt demonstrates deficits with strength/mobility/endurance/fatigue. Pt is currently not at baseline level at this time. Would benefit from skilled PT to address above deficits and promote optimal return to PLOF; recommend transition to STR upon discharge from acute hospitalization.       Follow Up Recommendations SNF    Equipment Recommendations  None recommended by PT    Recommendations for Other Services       Precautions / Restrictions Precautions Precautions: Fall Restrictions Weight Bearing Restrictions: No      Mobility  Bed Mobility Overal bed mobility: Needs Assistance Bed Mobility: Supine to Sit     Supine to sit: Min assist;+2 for physical assistance     General bed mobility comments: safe technique performed with assistance needed for sliding B LEs off and trunk support. Once seated at EOB, able to sit with upright posture. All mobility performed on 3L of O2.  Transfers Overall transfer level: Needs assistance Equipment used: Rolling walker (2 wheeled) Transfers: Sit to/from Stand Sit to Stand: Min assist;+2 physical assistance         General transfer comment: sit<>stand with RW with decreased power noted in B LE. Fatigues quickly  with slight forward flexed posture.  Ambulation/Gait Ambulation/Gait assistance: Min assist;+2 physical assistance Ambulation Distance (Feet): 3 Feet Assistive device: Rolling walker (2 wheeled) Gait Pattern/deviations: Step-to pattern     General Gait Details: short step to gait pattern using RW. Pt fatigues quickly and unable to further progress ambulation. Also de-sats quickly with sats decreasing to 70% while on 3L of O2. Increased O2 to 6L of O2 to improve sats within a few minutes back to 91% and then 99%. RN notified and entered room.  Stairs            Wheelchair Mobility    Modified Rankin (Stroke Patients Only)       Balance Overall balance assessment: Needs assistance Sitting-balance support: Feet supported;Bilateral upper extremity supported Sitting balance-Leahy Scale: Fair     Standing balance support: Bilateral upper extremity supported Standing balance-Leahy Scale: Fair                               Pertinent Vitals/Pain Pain Assessment: No/denies pain    Home Living Family/patient expects to be discharged to:: Private residence Living Arrangements: Children Available Help at Discharge: Family;Available 24 hours/day Type of Home: House Home Access: Stairs to enter Entrance Stairs-Rails: None Entrance Stairs-Number of Steps: 3(spaced out with walkway between each level (1 step each)) Home Layout: One level Home Equipment: Walker - 2 wheels;Cane - single point;Shower seat;Grab bars - tub/shower;Walker - 4 wheels;Other (comment);Wheelchair - manual      Prior Function Level of Independence: Needs assistance   Gait /  Transfers Assistance Needed: was previously independent with use of RW. Son would help with ADLs including bathing prep. Reports 1 fall in bathroom.           Hand Dominance        Extremity/Trunk Assessment   Upper Extremity Assessment Upper Extremity Assessment: Generalized weakness(B UE grossly 3+/5)     Lower Extremity Assessment Lower Extremity Assessment: Generalized weakness(B LE grossly 3/5)       Communication   Communication: No difficulties  Cognition Arousal/Alertness: Awake/alert Behavior During Therapy: WFL for tasks assessed/performed Overall Cognitive Status: Within Functional Limits for tasks assessed                                        General Comments      Exercises Other Exercises Other Exercises: seated weightshifting and static standing activities for balance prior to mobility efforts. Once seated, seated SLRs and ankle pumps performed x 10 with min assist. Very fatigued.   Assessment/Plan    PT Assessment Patient needs continued PT services  PT Problem List Decreased strength;Decreased activity tolerance;Decreased balance;Decreased mobility;Cardiopulmonary status limiting activity       PT Treatment Interventions Gait training;Therapeutic exercise;Balance training    PT Goals (Current goals can be found in the Care Plan section)  Acute Rehab PT Goals Patient Stated Goal: to get stronger PT Goal Formulation: With patient Time For Goal Achievement: 02/01/18 Potential to Achieve Goals: Good    Frequency Min 2X/week   Barriers to discharge        Co-evaluation               AM-PAC PT "6 Clicks" Daily Activity  Outcome Measure Difficulty turning over in bed (including adjusting bedclothes, sheets and blankets)?: Unable Difficulty moving from lying on back to sitting on the side of the bed? : Unable Difficulty sitting down on and standing up from a chair with arms (e.g., wheelchair, bedside commode, etc,.)?: Unable Help needed moving to and from a bed to chair (including a wheelchair)?: A Little Help needed walking in hospital room?: A Lot Help needed climbing 3-5 steps with a railing? : A Lot 6 Click Score: 10    End of Session Equipment Utilized During Treatment: Gait belt;Oxygen Activity Tolerance: Patient limited  by fatigue Patient left: in chair Nurse Communication: Mobility status PT Visit Diagnosis: Muscle weakness (generalized) (M62.81);History of falling (Z91.81);Difficulty in walking, not elsewhere classified (R26.2)    Time: 1005-1026 PT Time Calculation (min) (ACUTE ONLY): 21 min   Charges:   PT Evaluation $PT Eval Low Complexity: 1 Low PT Treatments $Therapeutic Exercise: 8-22 mins   PT G Codes:        Greggory Stallion, PT, DPT [phone]   Jahnai S[place]land 01/18/2018, 11:42 AM

## 2018-01-18 NOTE — Progress Notes (Signed)
   CHIEF COMPLAINT:   Chief Complaint  Patient presents with  . Respiratory Distress    Subjective  Severe SOB Increased WOB +fluid overload    Objective   Examination: PHYSICAL EXAMINATION:  GENERAL:critically ill appearing, +resp distress HEAD: Normocephalic, atraumatic.  EYES: Pupils equal, round, reactive to light.  No scleral icterus.  MOUTH: Moist mucosal membrane. NECK: Supple. No thyromegaly. No nodules. No JVD.  PULMONARY: +rhonchi, +wheezing, on BIPAP CARDIOVASCULAR: S1 and S2. Regular rate and rhythm. No murmurs, rubs, or gallops.  GASTROINTESTINAL: Soft, nontender, -distended. No masses. Positive bowel sounds. No hepatosplenomegaly.  MUSCULOSKELETAL: + edema.  NEUROLOGIC: alert and awake, follows simple commands SKIN:intact,warm,dry, small abrasion left ankle    VITALS:  height is 5\' 4"  (1.626 m) and weight is 194 lb 3.6 oz (88.1 kg). Her axillary temperature is 98.5 F (36.9 C). Her blood pressure is 133/51 (abnormal) and her pulse is 70. Her respiration is 15 and oxygen saturation is 99%.   I personally reviewed Labs under Results section.  Radiology Reports No results found.     ASSESSMENT / PLAN: Severe resp failure with Acute on chronic hypercapnic respiratory failure secondary to pneumonia and CHF exacerbation  Acute on chronic stage 4 renal failure -progressive cardio-renal syndrome  Will continue on BiPAP as needed Alternate with High Flow Oxygen and wean as needed Will continue to hold Lasix as per Nephrology discussion Follow up cariology recs  Recommend Palliative care on consult, meeting with family plans for this afternoon Recommend DNR/DNI  Status  Family: will update when available  Critical Care Time devoted to patient care services described in this note is 32 minutes.   Overall, patient is critically ill, prognosis is guarded.  Patient with Multiorgan failure and at high risk for cardiac arrest and death.    Cammie Sickle,  M.D.  [facility]

## 2018-01-18 NOTE — Progress Notes (Signed)
[facility], [place] 01/18/18  Subjective:   Patient known to our practice from previous admission This time she presents for cough with thick gray sputum and some shortness of breath Patient has significant history of smoking Admitted for management of respiratory distress Today, she feels better.  Sitting up on the recliner chair States her breathing has improved Less coughing but not resolved Serum creatinine slightly improved to 2.32  Objective:  Vital signs in last 24 hours:  Temp:  [97.8 F (36.6 C)-98.5 F (36.9 C)] 98.5 F (36.9 C) (05/24 0400) Pulse Rate:  [69-70] 70 (05/24 0900) Resp:  [14-32] 17 (05/24 0900) BP: (114-153)/(44-70) 114/47 (05/24 0900) SpO2:  [92 %-100 %] 98 % (05/24 0900) FiO2 (%):  [35 %] 35 % (05/24 0434) Weight:  [88.1 kg (194 lb 3.6 oz)] 88.1 kg (194 lb 3.6 oz) (05/24 0423)  Weight change:  Filed Weights   01/16/18 0330 01/16/18 0400 01/18/18 0423  Weight: 90.4 kg (199 lb 4.8 oz) 90.4 kg (199 lb 4.7 oz) 88.1 kg (194 lb 3.6 oz)    Intake/Output:    Intake/Output Summary (Last 24 hours) at 01/18/2018 1108 Last data filed at 01/18/2018 0400 Gross per 24 hour  Intake 325 ml  Output 800 ml  Net -475 ml     Physical Exam: General:  sitting up in recliner chair, no acute distress  HEENT  moist oral mucous membranes  Neck supple  Pulm/lungs left greater than right basilar crackles  CVS/Heart irregular rhythm  Abdomen:  Soft, nontender, nondistended  Extremities: No edema  Neurologic: Alert, oriented,   Skin: warm, dry          Basic Metabolic Panel:  Recent Labs  Lab 01/15/18 1203 01/16/18 0817 01/17/18 0306 01/18/18 0531  NA 138 138 138 137  K 3.5 3.8 4.4 4.5  CL 89* 90* 91* 90*  CO2 33* 36* 35* 32  GLUCOSE 203* 153* 147* 271*  BUN 45* 55* 71* 90*  CREATININE 2.12* 2.47* 2.54* 2.32*  CALCIUM 8.8* 8.4* 8.0* 8.0*  MG  --   --  2.1 2.4  PHOS  --   --  4.1 6.2*     CBC: Recent Labs  Lab  01/15/18 1203 01/16/18 0509 01/18/18 0531  WBC 9.6 7.8 3.4*  NEUTROABS 8.8*  --   --   HGB 9.1* 8.8* 7.8*  HCT 27.4* 26.1* 22.9*  MCV 86.1 86.9 86.2  PLT 143* 148* 125*      Lab Results  Component Value Date   HEPBSAG Negative 01/26/2017   HEPBSAB Non Reactive 01/26/2017      Microbiology:  Recent Results (from the past 240 hour(s))  Urine culture     Status: None   Collection Time: 01/15/18  4:30 AM  Result Value Ref Range Status   Specimen Description   Final    URINE, CLEAN CATCH Performed at [facility], [address] [place], [place] [address]    Special Requests   Final    NONE Performed at [facility], [address], [place], [place] [address]    Culture   Final    NO GROWTH Performed at [facility], [place] [address], [place], [place] [address]    Report Status 01/17/2018 FINAL  Final  Culture, blood (routine x 2)     Status: None (Preliminary result)   Collection Time: 01/15/18 12:04 PM  Result Value Ref Range Status   Specimen Description BLOOD LEFT HAND  Final  Special Requests BOTTLES DRAWN AEROBIC AND ANAEROBIC BCAV  Final   Culture   Final    NO GROWTH 3 DAYS Performed at [facility], [address] [place], [place] [address]    Report Status PENDING  Incomplete  Culture, blood (routine x 2)     Status: None (Preliminary result)   Collection Time: 01/15/18 12:04 PM  Result Value Ref Range Status   Specimen Description BLOOD LEFT FA  Final   Special Requests   Final    BOTTLES DRAWN AEROBIC AND ANAEROBIC Blood Culture adequate volume   Culture   Final    NO GROWTH 3 DAYS Performed at [facility], [address] [place], [place] [address]    Report Status PENDING  Incomplete  MRSA PCR Screening     Status: None   Collection Time: 01/16/18  4:30 AM  Result Value Ref Range Status   MRSA by PCR NEGATIVE NEGATIVE Final    Comment:        The GeneXpert MRSA Assay (FDA approved for NASAL  specimens only), is one component of a comprehensive MRSA colonization surveillance program. It is not intended to diagnose MRSA infection nor to guide or monitor treatment for MRSA infections. Performed at [facility], [address] [place], [place] [address]     Coagulation Studies: No results for input(s): LABPROT, INR in the last 72 hours.  Urinalysis: No results for input(s): COLORURINE, LABSPEC, PHURINE, GLUCOSEU, HGBUR, BILIRUBINUR, KETONESUR, PROTEINUR, UROBILINOGEN, NITRITE, LEUKOCYTESUR in the last 72 hours.  Invalid input(s): APPERANCEUR    Imaging: No results found.   Medications:   . sodium chloride    . cefTRIAXone (ROCEPHIN)  IV Stopped (01/17/18 1021)   . ALPRAZolam  0.5 mg Oral QHS  . atorvastatin  20 mg Oral Daily  . azithromycin  500 mg Oral Daily  . budesonide (PULMICORT) nebulizer solution  0.5 mg Nebulization BID  . busPIRone  7.5 mg Oral BID  . chlorhexidine  15 mL Mouth Rinse BID  . fenofibrate  54 mg Oral Daily  . heparin  5,000 Units Subcutaneous Q8H  . insulin aspart  0-9 Units Subcutaneous Q4H  . ipratropium-albuterol  3 mL Nebulization Q4H  . mouth rinse  15 mL Mouth Rinse q12n4p  . methylPREDNISolone (SOLU-MEDROL) injection  40 mg Intravenous BID  . metoprolol succinate  25 mg Oral Daily  . polyethylene glycol  17 g Oral Daily  . protein supplement shake  11 oz Oral BID BM  . sertraline  50 mg Oral Daily  . sodium chloride flush  3 mL Intravenous Q12H   sodium chloride, acetaminophen **OR** acetaminophen, bisacodyl, ibuprofen, levalbuterol, nitroGLYCERIN, ondansetron **OR** ondansetron (ZOFRAN) IV, pantoprazole, sodium chloride flush  Assessment/ Plan:  84 y.o. female with COPD, chronic diastolic CHF, pacemaker, chronic kidney disease, coronary disease with history of non-STEMI in April 2016, drug-eluting stent  1.  Acute renal failure, chronic kidney disease stage IV Baseline creatinine appears to be 2.12/GFR 21 from  may 20/08/2017 Creatinine has slightly improved to 2.32/ BUN is higher because of steroids Acute renal failure is likely secondary to ATN from concurrent illness/pneumonia UOP has improved to 800 cc  Plan: Hold furosemide Follow volume status closely Antibiotics as per CCU team   LOS: [place] 5/24/201911:08 AM  [place], [place]  Note: This note was prepared with Dragon dictation. Any transcription errors are unintentional

## 2018-01-18 NOTE — Consult Note (Signed)
Consultation Note Date: 01/18/2018   Patient Name: Regina Burke  DOB: 10-26-1932  MRN: [id number]  Age / Sex: 84 y.o., female  PCP: Tracie Harrier, MD Referring Physician: Fritzi Mandes, MD  Reason for Consultation: Establishing goals of care  HPI/Patient Profile: 84 y.o. female admitted on 01/15/2018 from home with shortness of breath. She has a past medical history significant for asthma, chronic diastolic heart failure, stage IV chronic kidney disease, COPD with chronic respiratory failure, coronary artery disease, depression, GERD, lung cancer (2014 s/p radiation 2015), and atrial fibrillation. Patient reports over the past 2 weeks she has had increasing shortness of breath, lower extremity edema, PND and orthopnea.  She also states she has had a productive cough.  She wears 2 L of oxygen at home which she increased to 3L prior to ED presentation. During her ED course her troponin was 0.13, BNP 1235 and creatinine is 2.12. She was started on broad-spectrum antibiotics for pneumonia and given 60 mg IV Lasix. Since admission she is now tolerating oxygen saturations on nasal cannula.  Continues to use BiPAP for support during the night.  She has been seen by cardiology and nephrology. Palliative Medicine team consulted for goals of care discussion.   Clinical Assessment and Goals of Care: I have reviewed medical records including lab results, imaging, Epic notes, and MAR, received report from the bedside RN, and assessed the patient. I then met at the bedside with her sons, [name] and [name] to discuss diagnosis prognosis, GOC, EOL wishes, disposition and options.  Is alert and oriented x3.  She is able to engage in goals of care conversation with family and I.  I re-introduced Palliative Medicine as specialized medical care for people living with serious illness. It focuses on providing relief from the symptoms and  stress of a serious illness. The goal is to improve quality of life for both the patient and the family.  Patient and family verbalized that in the past they received hospice services in the home.  She was most recently discharged from the service on August 01, 2017 due to an improvement in her status.  We discussed a brief life review of the patient.  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  She has 4 children all who live within [place].  She currently lives with her son [name] who is her primary caregiver and [facility].  As far as functional and nutritional status she states over the past couple of months she has noticed the increase in her fatigue and shortness of breath.  She currently wears 2 L nasal cannula at home at all times.  Famiy states she is ambulatory in the home with walker assistance.  He has used a walker for the past year due to her respiratory status and a fall.  Reports her appetite is fair depending on how she feels, if she is short of breath and have increase in fatigue she generally does not feel like eating much and would then drink  it is sure otherwise her appetite is fair, as she laughs and states she probably eats most things that she should not be eating.  She has noticed increasing swelling in her legs and feet over the past month.  He requires assistance with her ADLs such as bathing, meal preparation, and dressing at times.  She reports she is able to toilet herself and ambulate within the home to get to the bathroom.  We discussed her current illness and what it means in the larger context of her on-going co-morbidities.  Natural disease trajectory and expectations at EOL were discussed.  She and her family verbalized noticing a decline in her health over the past couple of months.  She verbalized being discharged from hospice services because she was doing well and also she wanted to continue to receive injections related to her chronic  anemia.  She feels as though she is a little weaker than her status back in December 2018 when she was discharged from hospice services.  I attempted to elicit values and goals of care important to the patient.    The difference between aggressive medical intervention and comfort care was considered in light of the patient's goals of care.  At this time both patient and family states they would like to continue to treat the treatable.  Because her current physical limitations in regards to her respiratory and cardiac status.  We also discussed her recently working with physical therapy and possible recommendations of skilled nursing facility for rehabilitation if her goal was to continue with aggressive care.  Patient adamantly verbalizes that she is not interested in returning to any kind of facility for rehabilitation services.  Her main goal is to be able to return home and walked with 25 feet going and coming to get to her bathroom.  Otherwise she feels comfortable that that her and her son can manage within the home, and could potentially think about having physical therapy services coming in.  Patient and family reports if she is unable to ambulate to the bathroom or continues to have increasing fatigue she will consider skilled nursing facility placement for rehabilitation however this is not something that she would really want.  Advanced directives, concepts specific to code status, artifical feeding and hydration, and rehospitalization were considered and discussed.  This time patient requests to be a DNR/DNI.  He verbalizes and supports her wishes.  Patient and family also verbalizes that she would not be interested in any forms of artificial feedings such as PEG tube placement and or dialysis treatments.  Hospice and Palliative Care services outpatient were explained and offered.  Patient and family have requested not to reinstate hospice services at this time.  We discussed the goals of hospice  and both palliative services.  Patient states she feels as though hospice will only discharge her again.  We discussed her current state of health compared to her health in December and she feels that she is in the same health condition she was in then as now.  Family and patient does agree for palliative care services to follow on outpatient basis and when the transition is needed for hospice they will then arrange for those services.   Questions and concerns were addressed.  The family was encouraged to call with questions or concerns.  PMT will continue to support holistically.  HCPOA-Troy Estill Bakes (Son).     SUMMARY OF RECOMMENDATIONS    DNR/DNI at patient and family's request.  Continue to treat the treatable  at patient and family's request.  Case management consult for outpatient palliative services at discharge.  Patient and family aware of physical deconditioning, respiratory status, cardiac status however at this time they are not interested in a skilled nursing facility for rehabilitation.  Patient and family request to return home with possible home health PT.  However family would like for patient to continue to work with therapy while in the hospital to see if she can regain some strength.  They report their main goal for her at the home is to be able to ambulate and toilet herself.  Recommend dietitian to see patient as patient is asking questions regarding appropriate cardiac diet and other nutritional supplements.  The medicine team will continue to support patient, family, medical team during hospitalization as needed.  Code Status/Advance Care Planning:  DNR/DNI at patient/family request   Palliative Prophylaxis:   Bowel Regimen and Delirium Protocol  Additional Recommendations (Limitations, Scope, Preferences):  Full Scope Treatment continue to treat the treatable at patient/family's request.   Psycho-social/Spiritual:   Desire for further Chaplaincy  support:NO  Prognosis:   Unable to determine-guarded to poor in the setting of respiratory failure, coronary artery disease, anemia, poor p.o. intake, decreased mobility, chronic kidney disease stage IV, diastolic congestive heart failure, COPD, diabetes, and persistent atrial fibrillation.  Discharge Planning: To Be Determined SNF rehab with outpatient palliative services vs. Home with HHPT and palliative.      Primary Diagnoses: Present on Admission: . Respiratory failure ([place])   I have reviewed the medical record, interviewed the patient and family, and examined the patient. The following aspects are pertinent.  Past Medical History:  Diagnosis Date  . Anemia of chronic disease   . Arthritis   . Asthma   . Cardiac resynchronization therapy pacemaker (CRT-P) in place    a. 03/31/16:  Medtronic Percepta Quad CRT-P MRI SureScan (serial Number [id number]) device.  . Chronic diastolic CHF (congestive heart failure) ([place])    a. 10/2015 Echo: EF 55-65%, Gr1 DD, mild MR, mildly dil LA, nl RV fxn, nl PASP;  b. 03/2016 Echo: EF 30-35%, mod MR, mod dil LA; c. 12/2016 Echo: EF 60-65%, no rwma, mild AS, mildly dil LA, PASP 79mHg.  . CKD (chronic kidney disease), stage IV ([place]   . COPD (chronic obstructive pulmonary disease) ([place]   . Coronary artery disease    a. 11/2014 NSTEMI/PCI: LM nl, LAD 551mD1 30, LCX mild dzs, OM1 20p, OM2 2024mM3 90p (2.25x8 Promus Premier DES), RCA nl.   . Cough    CHRONIC AT NIGHT  . Cushing's disease ([facility] . Depression   . Diverticulitis   . Edema    FEET/LEGS  . GERD (gastroesophageal reflux disease)   . Gout   . History of hiatal hernia   . History of pneumonia   . HLD (hyperlipidemia)   . HOH (hard of hearing)   . Hypertensive heart disease   . Lung cancer ([facility] 2014   S/P radiation 2015  . Migraine   . Mixed Ischemic and Nonischemic Cardiomyopathy ([facility]  a. 03/2016 Echo: EF 30-35%;  b. 12/2016 Echo: EF 60-65%.  . Moderate mitral  regurgitation   . Multiple allergies   . Myocardial infarction ([place] . Oxygen deficiency    2LITERS  . Persistent atrial fibrillation ([facility]  a. CHADS2VASc ==> 7 (CHF, HTN, age x 2, DM, vascular disease, and gender)--was on renal dosed eliquis but this was d/c'd in  12/2016 in setting of dark stools/anemia;  c. 03/2016 s/p AVN and MDT BiV ICD placement.  . Presence of permanent cardiac pacemaker   . S/P AV nodal ablation    a. on 03/31/16 for persistant afib with CRT-P placement  . Sleep apnea   . Type II diabetes mellitus ([place])    Social History   Socioeconomic History  . Marital status: Widowed    Spouse name: Not on file  . Number of children: Not on file  . Years of education: Not on file  . Highest education level: Not on file  Occupational History  . Occupation: retired  Scientific laboratory technician  . Financial resource strain: Not on file  . Food insecurity:    Worry: Not on file    Inability: Not on file  . Transportation needs:    Medical: Not on file    Non-medical: Not on file  Tobacco Use  . Smoking status: Former Smoker    Packs/day: 1.00    Years: 45.00    Pack years: 45.00    Types: Cigarettes    Last attempt to quit: 04/25/1994    Years since quitting: 23.7  . Smokeless tobacco: Never Used  Substance and Sexual Activity  . Alcohol use: No    Comment: 12/23/2014 "might have a couple mixed drinks/year"  . Drug use: No  . Sexual activity: Never  Lifestyle  . Physical activity:    Days per week: Not on file    Minutes per session: Not on file  . Stress: Not on file  Relationships  . Social connections:    Talks on phone: Not on file    Gets together: Not on file    Attends religious service: Not on file    Active member of club or organization: Not on file    Attends meetings of clubs or organizations: Not on file    Relationship status: Not on file  Other Topics Concern  . Not on file  Social History Narrative   Lives locally with son.  Does not routinely  exercise.   Family History  Problem Relation Age of Onset  . Heart disease Mother   . Diabetes Mother   . Osteoarthritis Mother   . Hypertension Mother   . Heart disease Father   . Hypertension Father   . COPD Brother    Scheduled Meds: . ALPRAZolam  0.5 mg Oral QHS  . atorvastatin  20 mg Oral Daily  . azithromycin  500 mg Oral Daily  . budesonide (PULMICORT) nebulizer solution  0.5 mg Nebulization BID  . busPIRone  7.5 mg Oral BID  . chlorhexidine  15 mL Mouth Rinse BID  . fenofibrate  54 mg Oral Daily  . heparin  5,000 Units Subcutaneous Q8H  . insulin aspart  0-9 Units Subcutaneous Q4H  . ipratropium-albuterol  3 mL Nebulization Q6H  . mouth rinse  15 mL Mouth Rinse q12n4p  . methylPREDNISolone (SOLU-MEDROL) injection  40 mg Intravenous BID  . metoprolol succinate  25 mg Oral Daily  . polyethylene glycol  17 g Oral Daily  . protein supplement shake  11 oz Oral BID BM  . sertraline  50 mg Oral Daily  . sodium chloride flush  3 mL Intravenous Q12H   Continuous Infusions: . sodium chloride    . cefTRIAXone (ROCEPHIN)  IV Stopped (01/17/18 1021)   PRN Meds:.sodium chloride, acetaminophen **OR** acetaminophen, bisacodyl, ibuprofen, levalbuterol, nitroGLYCERIN, ondansetron **OR** ondansetron (ZOFRAN) IV, pantoprazole, sodium chloride flush Medications Prior to Admission:  Prior to Admission medications   Medication Sig Start Date End Date Taking? Authorizing Provider  ALPRAZolam Duanne Moron) 0.5 MG tablet Take 0.5 mg by mouth at bedtime. 12/27/16  Yes [provider]  atorvastatin (LIPITOR) 20 MG tablet Take 20 mg by mouth daily.   Yes [provider]  busPIRone (BUSPAR) 7.5 MG tablet Take 7.5 mg by mouth 2 (two) times daily.  03/13/17 03/13/18 Yes [provider]  feeding supplement, ENSURE ENLIVE, (ENSURE ENLIVE) LIQD Take 237 mLs by mouth 3 (three) times daily with meals. 03/02/17  Yes Fritzi Mandes, MD  fenofibrate (TRICOR) 48 MG tablet Take 48 mg by mouth  every evening.    Yes [provider]  fluticasone furoate-vilanterol (BREO ELLIPTA) 100-25 MCG/INH AEPB Inhale 1 puff into the lungs daily as needed (shortness of breath).    Yes [provider]  metoprolol succinate (TOPROL XL) 25 MG 24 hr tablet Take 25 mg by mouth daily.  12/19/16  Yes Minna Merritts, MD  morphine 10 MG/5ML solution Take 5 mg by mouth daily as needed for severe pain.   Yes [provider]  nitroGLYCERIN (NITROSTAT) 0.4 MG SL tablet Place 1 tablet (0.4 mg total) under the tongue every 5 (five) minutes as needed for chest pain. 12/24/14  Yes Barrett, Evelene Croon, PA-C  pantoprazole (PROTONIX) 40 MG tablet Take 40 mg by mouth daily as needed (heartburn).    Yes [provider]  sertraline (ZOLOFT) 50 MG tablet Take 1 tablet (50 mg total) by mouth daily. 03/24/16  Yes Gladstone Lighter, MD  tiotropium (SPIRIVA) 18 MCG inhalation capsule Place 18 mcg into inhaler and inhale daily as needed (shortness of breath).    Yes [provider]  torsemide (DEMADEX) 20 MG tablet Take 2 tablets (40 mg total) by mouth daily. Patient taking differently: Take 30-40 mg by mouth daily. Dose depending upon fluid retention. 03/02/17  Yes Fritzi Mandes, MD  levalbuterol Penne Lash) 1.25 MG/0.5ML nebulizer solution Take 1.25 mg by nebulization every 6 (six) hours as needed for wheezing or shortness of breath. Patient not taking: Reported on 01/15/2018 03/02/17   Fritzi Mandes, MD   Allergies  Allergen Reactions  . Ciprofloxacin Shortness Of Breath, Itching and Rash  . Doxycycline Shortness Of Breath, Itching and Rash  . Penicillins Shortness Of Breath, Itching, Rash and Other (See Comments)    Has patient had a PCN reaction causing immediate rash, facial/tongue/throat swelling, SOB or lightheadedness with hypotension: Yes Has patient had a PCN reaction causing severe rash involving mucus membranes or skin necrosis: No Has patient had a PCN reaction that required  hospitalization No Has patient had a PCN reaction occurring within the last 10 years: No If all of the above answers are "NO", then may proceed with Cephalosporin use.  . Sulfa Antibiotics Shortness Of Breath, Itching and Rash  . Latex Itching  . Morphine And Related Itching  . Albuterol Sulfate   . Macrobid [Nitrofurantoin Macrocrystal] Itching  . Cefuroxime Rash    Blisters in mouth   Review of Systems  Constitutional: Positive for activity change and fatigue.  Respiratory: Positive for cough and shortness of breath.   Neurological: Positive for weakness.  All other systems reviewed and are negative.   Physical Exam  Constitutional: She is oriented to person, place, and time. She appears well-developed. She is cooperative. She appears ill.  Cardiovascular: Normal rate, regular rhythm, normal heart sounds, intact distal pulses and normal pulses.  Pulmonary/Chest: She has decreased breath sounds.  Shortness  of breath, orthopnea   Abdominal: Soft. Bowel sounds are normal.  Obese   Musculoskeletal:  Generalized weakness   Neurological: She is alert and oriented to person, place, and time.  Psychiatric: She has a normal mood and affect. Her speech is normal and behavior is normal. Judgment and thought content normal.  Nursing note and vitals reviewed.   Vital Signs: BP 92/82   Pulse 70   Temp 98.5 F (36.9 C) (Axillary)   Resp (!) 25   Ht _0  (1.626 m)   Wt 88.1 kg (194 lb 3.6 oz)   SpO2 95%   BMI 33.34 kg/m  Pain Scale: 0-10   Pain Score: Asleep   SpO2: SpO2: 95 % O2 Device:SpO2: 95 % O2 Flow Rate: .O2 Flow Rate (L/min): 2 L/min  IO: Intake/output summary:   Intake/Output Summary (Last 24 hours) at 01/18/2018 1355 Last data filed at 01/18/2018 1220 Gross per 24 hour  Intake 325 ml  Output 1200 ml  Net -875 ml    LBM: Last BM Date: 01/15/18(per report) Baseline Weight: Weight: 86.6 kg (191 lb) Most recent weight: Weight: 88.1 kg (194 lb 3.6 oz)      Palliative Assessment/Data:PPS 40%   Time In: 1245 Time Out: 1400 Time Total: 75 min.  Greater than 50%  of this time was spent counseling and coordinating care related to the above assessment and plan.  Signed by: Alda Lea, NP-BC Palliative Medicine Team  Phone: [phone] Fax: [phone]   Please contact Palliative Medicine Team phone at [phone] for questions and concerns.  For individual provider: See Shea Evans

## 2018-01-18 NOTE — Progress Notes (Signed)
Progress Note  Patient Name: Regina Burke Date of Encounter: 01/18/2018  Primary Cardiologist: [facility]  Subjective   Reports that she feels little bit better today Drank a can of boost Mild improvement in her breathing this Am but was on BiPAP overnight Very mild cough, Sometimes productive  willing to try to get out of the bed today to recliner , Did not feel strong enough yesterday  Inpatient Medications    Scheduled Meds: . ALPRAZolam  0.5 mg Oral QHS  . atorvastatin  20 mg Oral Daily  . azithromycin  500 mg Oral Daily  . budesonide (PULMICORT) nebulizer solution  0.5 mg Nebulization BID  . busPIRone  7.5 mg Oral BID  . chlorhexidine  15 mL Mouth Rinse BID  . fenofibrate  54 mg Oral Daily  . heparin  5,000 Units Subcutaneous Q8H  . insulin aspart  0-9 Units Subcutaneous Q4H  . ipratropium-albuterol  3 mL Nebulization Q4H  . mouth rinse  15 mL Mouth Rinse q12n4p  . methylPREDNISolone (SOLU-MEDROL) injection  40 mg Intravenous BID  . metoprolol succinate  25 mg Oral Daily  . polyethylene glycol  17 g Oral Daily  . protein supplement shake  11 oz Oral BID BM  . sertraline  50 mg Oral Daily  . sodium chloride flush  3 mL Intravenous Q12H   Continuous Infusions: . sodium chloride    . cefTRIAXone (ROCEPHIN)  IV Stopped (01/17/18 1021)   PRN Meds: sodium chloride, acetaminophen **OR** acetaminophen, bisacodyl, ibuprofen, levalbuterol, nitroGLYCERIN, ondansetron **OR** ondansetron (ZOFRAN) IV, pantoprazole, sodium chloride flush   Vital Signs    Vitals:   01/18/18 0600 01/18/18 0700 01/18/18 0800 01/18/18 0900  BP: (!) 131/50 (!) 133/51 (!) 133/56 (!) 114/47  Pulse: 70 70 70 70  Resp: 16 15 14 17   Temp:      TempSrc:      SpO2: 100% 99% 100% 98%  Weight:      Height:        Intake/Output Summary (Last 24 hours) at 01/18/2018 1115 Last data filed at 01/18/2018 0400 Gross per 24 hour  Intake 325 ml  Output 800 ml  Net -475 ml   Filed Weights   01/16/18 0330 01/16/18 0400 01/18/18 0423  Weight: 199 lb 4.8 oz (90.4 kg) 199 lb 4.7 oz (90.4 kg) 194 lb 3.6 oz (88.1 kg)    Telemetry    Atrial fib - Personally Reviewed  ECG     Physical Exam   Constitutional:  oriented to person, place, and time. No distress. Appears pale, weak HENT:  Head: Normocephalic and atraumatic.  Eyes:  no discharge. No scleral icterus.  Neck: Normal range of motion. Neck supple. No JVD present.  Cardiovascular: Irregularly irregular,   Exam reveals no gallop and no friction rub. No edema No murmur heard. Pulmonary/Chest: Effort normal and breath sounds normal. No stridor. No respiratory distress.  no wheezes.  no rales.  no tenderness.  Abdominal: Soft.  no distension.  no tenderness.  Musculoskeletal: Normal range of motion.  no  tenderness or deformity.  Neurological:  normal muscle tone. Coordination normal. No atrophy Skin: Skin is warm and dry. No rash noted. not diaphoretic.  Psychiatric:  normal mood and affect. behavior is normal. Thought content normal.      Labs    Chemistry Recent Labs  Lab 01/15/18 1203 01/16/18 0817 01/17/18 0306 01/18/18 0531  NA 138 138 138 137  K 3.5 3.8 4.4 4.5  CL 89* 90* 91*  90*  CO2 33* 36* 35* 32  GLUCOSE 203* 153* 147* 271*  BUN 45* 55* 71* 90*  CREATININE 2.12* 2.47* 2.54* 2.32*  CALCIUM 8.8* 8.4* 8.0* 8.0*  PROT 7.4  --   --   --   ALBUMIN 3.7  --   --  2.9*  AST 33  --   --   --   ALT 14  --   --   --   ALKPHOS 45  --   --   --   BILITOT 1.8*  --   --   --   GFRNONAA 21* 17* 17* 18*  GFRAA 24* 20* 19* 21*  ANIONGAP 16* 12 12 15      Hematology Recent Labs  Lab 01/15/18 1203 01/16/18 0509 01/18/18 0531  WBC 9.6 7.8 3.4*  RBC 3.18* 3.00* 2.66*  HGB 9.1* 8.8* 7.8*  HCT 27.4* 26.1* 22.9*  MCV 86.1 86.9 86.2  MCH 28.6 29.2 29.5  MCHC 33.2 33.7 34.2  RDW 16.4* 16.0* 15.8*  PLT 143* 148* 125*    Cardiac Enzymes Recent Labs  Lab 01/15/18 2118 01/16/18 0148 01/16/18 0817  01/18/18 0531  TROPONINI 0.16* 0.16* 0.12* 0.05*   No results for input(s): TROPIPOC in the last 168 hours.   BNP Recent Labs  Lab 01/15/18 1203  BNP 1,235.0*     DDimer No results for input(s): DDIMER in the last 168 hours.   Radiology    No results found.  Cardiac Studies   Echo - Left ventricle: The cavity size was normal. There was mild   concentric hypertrophy. Systolic function was normal. The   estimated ejection fraction was in the range of 55% to 60%. Wall   motion was normal; there were no regional wall motion   abnormalities. The study is not technically sufficient to allow   evaluation of LV diastolic function. - Aortic valve: There was very mild stenosis. Peak gradient (S): 20   mm Hg. - Left atrium: The atrium was moderately dilated. - Right atrium: The atrium was mildly dilated. - Tricuspid valve: There was moderate regurgitation. - Pulmonary arteries: Systolic pressure was moderate to severely   elevated. PA peak pressure: 64 mm Hg (S).   Patient Profile     KASSADEE CARAWAN is a 84 y.o. female With  COPD on chronic oxygen,  coronary artery diseasewith hospitalization April 2016 stent placed to marginal branch of the circumflex, Persistent atrial fibrillation,  03/31/2016, AV node ablation, Biventricular ICD implantation severe anemia, previous epo shot lung cancer, diabetes mellitus, episode of acute respiratory distress requiring hospitalization  Presenting to the hospital with respiratory distress, atrial fibrillation, chronic diastolic CHF Possible pneumonia multifocal  Assessment & Plan    A/P: 1) acute respiratory distress Pulmonary hypertension on echocardiogram, left ventricular systolic pressure 75  In the setting of anemia, severe COPD Unable to exclude multifocal pneumonia Initially on bipap, weaned off.   Lasix on hold after discussion with nephrology given worsening renal dysfunction -On broad-spectrum antibiotics, --If no  significant improvement in respiratory status over the weekend, would consider right heart catheterization  to estimate wedge pressure, to guide diuresis  2) Persistent Afib  s/p AV nodal ablation with CRT-D rates well controlled On Toprol XL History of GI bleeding, not on anticoagulation Anemic  3) chronic anemia Followed by Dr. Vira Agar, Dr. Grayland Ormond  History of GI bleed Would guaiac stools, check iron studies May need procrit, worse on today's numbers  this will contribute to her heart failure symptoms  4) acute on chronic renal failure  Followed by nephrology May need right heart catheterization to evaluate pressures if no improvement Markedly elevated pressures on echo  5) pulmonary hypertension Long history of smoking, also in the setting of possible pneumonia Unable to exclude component of diastolic CHF Echo May 9562 pressures were only mildly elevated Consider vasodilator therapy , revatio?  Long discussion with patient concerning above  Total encounter time more than 35 minutes  Greater than 50% was spent in counseling and coordination of care with the patient  For questions or updates, please contact [facility] Please consult [email or web address] for contact info under Cardiology/STEMI.      Signed, Ida Rogue, MD  01/18/2018, 11:15 AM

## 2018-01-18 NOTE — Progress Notes (Signed)
Pharmacy Antibiotic Note  Regina Burke is a 84 y.o. female admitted on 01/15/2018 with pneumonia. Pharmacy has been consulted for Ceftriaxone dosing. Patient has several abx allergies documented. Tolerated Ceftriaxone on admission in September 2016.  Plan: Continue ceftriaxone 1 g IV daily for total days of therapy.  Will continue azithromycin 500 mg PO daily for total of days of therapy.   Height: 5\' 4"  (162.6 cm) Weight: 194 lb 3.6 oz (88.1 kg) IBW/kg (Calculated) : 54.7  Temp (24hrs), Avg:98.2 F (36.8 C), Min:97.8 F (36.6 C), Max:98.5 F (36.9 C)  Recent Labs  Lab 01/15/18 1203 01/15/18 1542 01/16/18 0509 01/16/18 0817 01/17/18 0306 01/18/18 0531  WBC 9.6  --  7.8  --   --  3.4*  CREATININE 2.12*  --   --  2.47* 2.54* 2.32*  LATICACIDVEN 1.3 1.7  --   --   --   --     Estimated Creatinine Clearance: 20.1 mL/min (A) (by C-G formula based on SCr of 2.32 mg/dL (H)).    Allergies  Allergen Reactions  . Ciprofloxacin Shortness Of Breath, Itching and Rash  . Doxycycline Shortness Of Breath, Itching and Rash  . Penicillins Shortness Of Breath, Itching, Rash and Other (See Comments)    Has patient had a PCN reaction causing immediate rash, facial/tongue/throat swelling, SOB or lightheadedness with hypotension: Yes Has patient had a PCN reaction causing severe rash involving mucus membranes or skin necrosis: No Has patient had a PCN reaction that required hospitalization No Has patient had a PCN reaction occurring within the last 10 years: No If all of the above answers are "NO", then may proceed with Cephalosporin use.  . Sulfa Antibiotics Shortness Of Breath, Itching and Rash  . Latex Itching  . Morphine And Related Itching  . Albuterol Sulfate   . Macrobid [Nitrofurantoin Macrocrystal] Itching  . Cefuroxime Rash    Blisters in mouth    Antimicrobials this admission: Meropenem 5/21 >> 5/21 Azithromycin 5/21 >> 5/25 Ceftriaxone 5/23 >> 5/28  Dose adjustments this  admission: 5/22 Azithromycin transitioned to PO.  Microbiology results: 5/21 BCx: no growth x 3 days 5/21 UCx: no growth 5/22 MRSA PCR: negative  Thank you for allowing pharmacy to be a part of this patient's care.  Ricardo Jericho 01/18/2018 11:32 AM

## 2018-01-18 NOTE — Progress Notes (Signed)
[place] [facility] [place] NAME: Regina Burke    MR#:  [id number]  DATE OF BIRTH:  1933/08/08  SUBJECTIVE:   Patient came in with increasing shortness of breath.  Trying to eat food. She is now nasal cannula oxygen. Patient is sitting on the recliner eating lunch. She feels a lot better. Family in the room. REVIEW OF SYSTEMS:   Review of Systems  Constitutional: Negative for chills, fever and weight loss.  HENT: Negative for ear discharge, ear pain and nosebleeds.   Eyes: Negative for blurred vision, pain and discharge.  Respiratory: Positive for cough and shortness of breath. Negative for sputum production, wheezing and stridor.   Cardiovascular: Positive for orthopnea. Negative for chest pain, palpitations and PND.  Gastrointestinal: Negative for abdominal pain, diarrhea, nausea and vomiting.  Genitourinary: Negative for frequency and urgency.  Musculoskeletal: Negative for back pain and joint pain.  Neurological: Positive for weakness. Negative for sensory change, speech change and focal weakness.  Psychiatric/Behavioral: Negative for depression and hallucinations. The patient is not nervous/anxious.    Tolerating Diet:yes Tolerating PT: pending  DRUG ALLERGIES:   Allergies  Allergen Reactions  . Ciprofloxacin Shortness Of Breath, Itching and Rash  . Doxycycline Shortness Of Breath, Itching and Rash  . Penicillins Shortness Of Breath, Itching, Rash and Other (See Comments)    Has patient had a PCN reaction causing immediate rash, facial/tongue/throat swelling, SOB or lightheadedness with hypotension: Yes Has patient had a PCN reaction causing severe rash involving mucus membranes or skin necrosis: No Has patient had a PCN reaction that required hospitalization No Has patient had a PCN reaction occurring within the last 10 years: No If all of the above answers are "NO", then may proceed with Cephalosporin use.  . Sulfa Antibiotics  Shortness Of Breath, Itching and Rash  . Latex Itching  . Morphine And Related Itching  . Albuterol Sulfate   . Macrobid [Nitrofurantoin Macrocrystal] Itching  . Cefuroxime Rash    Blisters in mouth    VITALS:  Blood pressure 92/82, pulse 70, temperature 98.5 F (36.9 C), temperature source Axillary, resp. rate (!) 25, height 5\' 4"  (1.626 m), weight 88.1 kg (194 lb 3.6 oz), SpO2 95 %.  PHYSICAL EXAMINATION:   Physical Exam  GENERAL:  84 y.o.-year-old patient lying in the bed with no acute distress.  EYES: Pupils equal, round, reactive to light and accommodation. No scleral icterus. Extraocular muscles intact.  HEENT: Head atraumatic, normocephalic. Oropharynx and nasopharynx clear.  NECK:  Supple, no jugular venous distention. No thyroid enlargement, no tenderness.  LUNGS: decreased breath sounds bilaterally, no wheezing, rales, rhonchi. No use of accessory muscles of respiration.  CARDIOVASCULAR: S1, S2 normal. No murmurs, rubs, or gallops.  ABDOMEN: Soft, nontender, nondistended. Bowel sounds present. No organomegaly or mass.  EXTREMITIES: No cyanosis, clubbing  ++edema b/l.    NEUROLOGIC: Cranial nerves II through XII are intact. No focal Motor or sensory deficits b/l.   PSYCHIATRIC:  patient is alert and oriented x 3.  SKIN: No obvious rash, lesion, or ulcer.   LABORATORY PANEL:  CBC Recent Labs  Lab 01/18/18 0531  WBC 3.4*  HGB 7.8*  HCT 22.9*  PLT 125*    Chemistries  Recent Labs  Lab 01/15/18 1203  01/18/18 0531  NA 138   < > 137  K 3.5   < > 4.5  CL 89*   < > 90*  CO2 33*   < > 32  GLUCOSE 203*   < >  271*  BUN 45*   < > 90*  CREATININE 2.12*   < > 2.32*  CALCIUM 8.8*   < > 8.0*  MG  --    < > 2.4  AST 33  --   --   ALT 14  --   --   ALKPHOS 45  --   --   BILITOT 1.8*  --   --    < > = values in this interval not displayed.   Cardiac Enzymes Recent Labs  Lab 01/18/18 0531  TROPONINI 0.05*   RADIOLOGY:  No results found. ASSESSMENT AND PLAN:    Nasiya Pascual  is a 84 y.o. female with a known history of chronic diastolic heart failure pEF, CAD and chronic hypoxic respiratory failure due to COPD who presents today to the emergency room due to shortness of breath.  Patient reports over the past 2 weeks she has had increasing shortness of breath, lower extremity edema, PND and orthopnea.  She also states she has had a productive cough.  She wears 2 L of oxygen at home today was increased to 3 L.  1.  Acute on chronic hypoxic respiratory failure in the setting of acute on chronic diastolic heart failure and pneumonia Wean oxygen to baseline 2 L as tolerated.  2.  Acute on chronic diastolic heart failure with preserved ejection fraction: Continue Lasix IV 60 mg BID--now on hold since 5/23 -creat rising CHF clinic upon discharge Consultation with [facility] cardiology appreciated. -?possibilty of Right Heart cath to see wedge pressures  3.  Pneumonia: Patient has multiple allergies As per pharmacy we will continue azithromycin and Rocephin Pro calcitonin 6.05  4.  Acute  chronic kidney disease stage III:  -This is likely due to prerenal azotemia from acute diastolic heart failure on chronic. -creat 2.47--2.54--hold lasix  5.  Chronic hypoxic respiratory failure with COPD: No signs of exacerbation  6.  CAD: Continue statin and metoprolol  7.  Elevated troponin: This may be due to demand ischemia Continue to monitor troponins and follow on telemetry follow cardiology recommendations  8. D/c planning patient has weakness in the condition secondary to her multiple medical problems including CHF and respiratory failure. Patient is not wanting to go to rehab. She has had hospice follow-up with her as outpatient at home along with physical therapy. She wishes to go home. Her son lives with her does not work and is helping take care of her at home. Spoke with patient that will dicuss care Freight forwarder and work out her discharge  plan. Palliative care to meet patient today   Case discussed with Care Management/Social Worker. Management plans discussed with the patient, family and they are in agreement.  CODE STATUS: full  DVT Prophylaxis: heparin  TOTAL TIME TAKING CARE OF THIS PATIENT: *25* minutes.  >50% time spent on counselling and coordination of care  POSSIBLE D/C IN *few* DAYS, DEPENDING ON CLINICAL CONDITION.  Note: This dictation was prepared with Dragon dictation along with smaller phrase technology. Any transcriptional errors that result from this process are unintentional.  Fritzi Mandes M.D on 01/18/2018 at 1:12 PM  Between 7am to 6pm - Pager - [phone]  After 6pm go to [email or web address] - Proofreader  [facility]  Office  [phone]  CC: Primary care physician; Tracie Harrier, MDPatient ID: Sela Hilding, female   DOB: [birth year], 85 y.o.   MRN: [id number]

## 2018-01-18 NOTE — Progress Notes (Signed)
Pharmacy Electrolyte Monitoring Consult:  Pharmacy consulted to assist in monitoring and replacing electrolytes in this 84 y.o. female admitted on 01/15/2018 with Respiratory Distress   Labs:  Sodium (mmol/L)  Date Value  01/18/2018 137  04/13/2016 142  12/09/2014 134 (L)   Potassium (mmol/L)  Date Value  01/18/2018 4.5  12/09/2014 4.5   Magnesium (mg/dL)  Date Value  01/18/2018 2.4   Phosphorus (mg/dL)  Date Value  01/18/2018 6.2 (H)   Calcium (mg/dL)  Date Value  01/18/2018 8.0 (L)   Calcium, Total (mg/dL)  Date Value  12/09/2014 8.7 (L)   Albumin (g/dL)  Date Value  01/18/2018 2.9 (L)  12/09/2014 3.9    Assessment/Plan: No replacement warranted at this time. Will recheck all electrolytes with am labs.   Pharmacy will continue to monitor and adjust per consult.   Ricardo Jericho 01/18/2018 11:45 AM

## 2018-01-19 ENCOUNTER — Inpatient Hospital Stay: Payer: Medicare Other

## 2018-01-19 DIAGNOSIS — J432 Centrilobular emphysema: Secondary | ICD-10-CM

## 2018-01-19 DIAGNOSIS — I509 Heart failure, unspecified: Secondary | ICD-10-CM

## 2018-01-19 LAB — CBC
HCT: 25.1 % — ABNORMAL LOW (ref 35.0–47.0)
Hemoglobin: 8.4 g/dL — ABNORMAL LOW (ref 12.0–16.0)
MCH: 28.5 pg (ref 26.0–34.0)
MCHC: 33.4 g/dL (ref 32.0–36.0)
MCV: 85.3 fL (ref 80.0–100.0)
PLATELETS: 161 10*3/uL (ref 150–440)
RBC: 2.94 MIL/uL — AB (ref 3.80–5.20)
RDW: 15.6 % — AB (ref 11.5–14.5)
WBC: 5.3 10*3/uL (ref 3.6–11.0)

## 2018-01-19 LAB — GLUCOSE, CAPILLARY
GLUCOSE-CAPILLARY: 249 mg/dL — AB (ref 65–99)
Glucose-Capillary: 202 mg/dL — ABNORMAL HIGH (ref 65–99)
Glucose-Capillary: 254 mg/dL — ABNORMAL HIGH (ref 65–99)
Glucose-Capillary: 259 mg/dL — ABNORMAL HIGH (ref 65–99)
Glucose-Capillary: 226 mg/dL — ABNORMAL HIGH (ref 65–99)

## 2018-01-19 LAB — RENAL FUNCTION PANEL
Albumin: 3.1 g/dL — ABNORMAL LOW (ref 3.5–5.0)
Anion gap: 13 (ref 5–15)
BUN: 112 mg/dL — ABNORMAL HIGH (ref 6–20)
CALCIUM: 8 mg/dL — AB (ref 8.9–10.3)
CHLORIDE: 89 mmol/L — AB (ref 101–111)
CO2: 33 mmol/L — ABNORMAL HIGH (ref 22–32)
Creatinine, Ser: 2.13 mg/dL — ABNORMAL HIGH (ref 0.44–1.00)
GFR, EST AFRICAN AMERICAN: 24 mL/min — AB (ref >60)
GFR, EST NON AFRICAN AMERICAN: 20 mL/min — AB (ref >60)
Glucose, Bld: 236 mg/dL — ABNORMAL HIGH (ref 65–99)
PHOSPHORUS: 4.8 mg/dL — AB (ref 2.5–4.6)
Potassium: 4.6 mmol/L (ref 3.5–5.1)
Sodium: 135 mmol/L (ref 135–145)

## 2018-01-19 LAB — MAGNESIUM: MAGNESIUM: 2.5 mg/dL — AB (ref 1.7–2.4)

## 2018-01-19 MED ORDER — INSULIN ASPART 100 UNIT/ML ~~LOC~~ SOLN
0.0000 [IU] | Freq: Three times a day (TID) | SUBCUTANEOUS | Status: DC
  Administered 2018-01-20: 1 [IU] via SUBCUTANEOUS
  Administered 2018-01-20: 2 [IU] via SUBCUTANEOUS
  Administered 2018-01-21 (×3): 1 [IU] via SUBCUTANEOUS
  Filled 2018-01-19 (×5): qty 1

## 2018-01-19 MED ORDER — IPRATROPIUM-ALBUTEROL 0.5-2.5 (3) MG/3ML IN SOLN
3.0000 mL | Freq: Two times a day (BID) | RESPIRATORY_TRACT | Status: DC
  Administered 2018-01-19 – 2018-01-22 (×5): 3 mL via RESPIRATORY_TRACT
  Filled 2018-01-19 (×7): qty 3

## 2018-01-19 MED ORDER — CEFDINIR 300 MG PO CAPS
300.0000 mg | ORAL_CAPSULE | Freq: Two times a day (BID) | ORAL | Status: DC
  Filled 2018-01-19 (×2): qty 1

## 2018-01-19 MED ORDER — CEFDINIR 300 MG PO CAPS
300.0000 mg | ORAL_CAPSULE | Freq: Two times a day (BID) | ORAL | Status: AC
  Administered 2018-01-20 (×2): 300 mg via ORAL
  Filled 2018-01-19 (×3): qty 1

## 2018-01-19 NOTE — Progress Notes (Signed)
Pt transferring to unit 2A, room 238 today. Pt/family is aware and agreeable to transfer. Assessment is unchanged from this morning and receiving RN has been given report. Belongings sent with pt at bedside, pt transferred by [facility] nurse.

## 2018-01-19 NOTE — Plan of Care (Signed)
  Problem: Education: Goal: Knowledge of General Education information will improve Outcome: Progressing   Problem: Health Behavior/Discharge Planning: Goal: Ability to manage health-related needs will improve Outcome: Progressing   Problem: Clinical Measurements: Goal: Ability to maintain clinical measurements within normal limits will improve Outcome: Progressing Goal: Diagnostic test results will improve Outcome: Progressing Goal: Respiratory complications will improve Outcome: Progressing Goal: Cardiovascular complication will be avoided Outcome: Progressing   Problem: Pain Managment: Goal: General experience of comfort will improve Outcome: Progressing

## 2018-01-19 NOTE — Progress Notes (Signed)
[facility] [place] NAME: Regina Burke    MR#:  [id number]  DATE OF BIRTH:  1932/10/05  SUBJECTIVE:   Patient came in with increasing shortness of breath.  Trying to eat food. She is now nasal cannula oxygen.. She feels a lot better. Family in the room. REVIEW OF SYSTEMS:   Review of Systems  Constitutional: Negative for chills, fever and weight loss.  HENT: Negative for ear discharge, ear pain and nosebleeds.   Eyes: Negative for blurred vision, pain and discharge.  Respiratory: Positive for cough and shortness of breath. Negative for sputum production, wheezing and stridor.   Cardiovascular: Positive for orthopnea. Negative for chest pain, palpitations and PND.  Gastrointestinal: Negative for abdominal pain, diarrhea, nausea and vomiting.  Genitourinary: Negative for frequency and urgency.  Musculoskeletal: Negative for back pain and joint pain.  Neurological: Positive for weakness. Negative for sensory change, speech change and focal weakness.  Psychiatric/Behavioral: Negative for depression and hallucinations. The patient is not nervous/anxious.    Tolerating Diet:yes Tolerating PT: yes  DRUG ALLERGIES:   Allergies  Allergen Reactions  . Ciprofloxacin Shortness Of Breath, Itching and Rash  . Doxycycline Shortness Of Breath, Itching and Rash  . Penicillins Shortness Of Breath, Itching, Rash and Other (See Comments)    Has patient had a PCN reaction causing immediate rash, facial/tongue/throat swelling, SOB or lightheadedness with hypotension: Yes Has patient had a PCN reaction causing severe rash involving mucus membranes or skin necrosis: No Has patient had a PCN reaction that required hospitalization No Has patient had a PCN reaction occurring within the last 10 years: No If all of the above answers are "NO", then may proceed with Cephalosporin use.  . Sulfa Antibiotics Shortness Of Breath, Itching and Rash  . Latex  Itching  . Morphine And Related Itching  . Albuterol Sulfate   . Macrobid [Nitrofurantoin Macrocrystal] Itching  . Cefuroxime Rash    Blisters in mouth    VITALS:  Blood pressure (!) 145/64, pulse 70, temperature 98 F (36.7 C), temperature source Oral, resp. rate 18, height 5\' 4"  (1.626 m), weight 90.3 kg (199 lb 1.2 oz), SpO2 97 %.  PHYSICAL EXAMINATION:   Physical Exam  GENERAL:  84 y.o.-year-old patient lying in the bed with no acute distress.  EYES: Pupils equal, round, reactive to light and accommodation. No scleral icterus. Extraocular muscles intact.  HEENT: Head atraumatic, normocephalic. Oropharynx and nasopharynx clear.  NECK:  Supple, no jugular venous distention. No thyroid enlargement, no tenderness.  LUNGS: decreased breath sounds bilaterally, no wheezing, rales, rhonchi. No use of accessory muscles of respiration.  CARDIOVASCULAR: S1, S2 normal. No murmurs, rubs, or gallops.  ABDOMEN: Soft, nontender, nondistended. Bowel sounds present. No organomegaly or mass.  EXTREMITIES: No cyanosis, clubbing  ++edema b/l.    NEUROLOGIC: Cranial nerves II through XII are intact. No focal Motor or sensory deficits b/l.   PSYCHIATRIC:  patient is alert and oriented x 3.  SKIN: No obvious rash, lesion, or ulcer.   LABORATORY PANEL:  CBC Recent Labs  Lab 01/19/18 0511  WBC 5.3  HGB 8.4*  HCT 25.1*  PLT 161    Chemistries  Recent Labs  Lab 01/15/18 1203  01/19/18 0511  NA 138   < > 135  K 3.5   < > 4.6  CL 89*   < > 89*  CO2 33*   < > 33*  GLUCOSE 203*   < > 236*  BUN 45*   < >  112*  CREATININE 2.12*   < > 2.13*  CALCIUM 8.8*   < > 8.0*  MG  --    < > 2.5*  AST 33  --   --   ALT 14  --   --   ALKPHOS 45  --   --   BILITOT 1.8*  --   --    < > = values in this interval not displayed.   Cardiac Enzymes Recent Labs  Lab 01/18/18 0531  TROPONINI 0.05*   RADIOLOGY:  Dg Chest Port 1 View  Result Date: 01/19/2018 CLINICAL DATA:  Acute respiratory failure.  EXAM: PORTABLE CHEST 1 VIEW COMPARISON:  Radiograph of Jan 16, 2018. FINDINGS: Stable cardiomegaly with central pulmonary vascular congestion. Atherosclerosis of thoracic aorta is noted. Left-sided PICC line is unchanged in position. No pneumothorax is noted. Stable patchy opacities are noted throughout both lungs concerning for inflammation. Stable left basilar opacity is noted concerning for atelectasis or infiltrate with associated effusion. Bony thorax is unremarkable. IMPRESSION: Stable cardiomegaly with central pulmonary vascular congestion. Stable bilateral lung opacities as described above. Stable left basilar opacity concerning for atelectasis or infiltrate with associated effusion. Aortic Atherosclerosis (ICD10-I70.0). Electronically Signed   By: Marijo Conception, M.D.   On: 01/19/2018 07:07   ASSESSMENT AND PLAN:   Regina Burke  is a 84 y.o. female with a known history of chronic diastolic heart failure pEF, CAD and chronic hypoxic respiratory failure due to COPD who presents today to the emergency room due to shortness of breath.  Patient reports over the past 2 weeks she has had increasing shortness of breath, lower extremity edema, PND and orthopnea.  She also states she has had a productive cough.  She wears 2 L of oxygen at home today was increased to 3 L.  1.  Acute on chronic hypoxic respiratory failure in the setting of acute on chronic diastolic heart failure and pneumonia Wean oxygen to baseline 2 L as tolerated.  2.  Acute on chronic diastolic heart failure with preserved ejection fraction: Continue Lasix IV 60 mg BID--now on hold since 5/23 -creat better today. Will need to resume lasix CHF clinic upon discharge Consultation with [facility] cardiology appreciated.  3.  Pneumonia: Patient has multiple allergies As per pharmacy we will continue azithromycin and Rocephin--change to oral abxs Pro calcitonin 6.05  4.  Acute  chronic kidney disease stage III:  -This is likely due  to prerenal azotemia from acute diastolic heart failure on chronic. -creat 2.47--2.54--hold lasix--2.12  5.  Chronic hypoxic respiratory failure with COPD: No signs of exacerbation Received IV steroids--now discontinued  6.  CAD: Continue statin and metoprolol  7.  Elevated troponin: This may be due to demand ischemia Continue to monitor troponins and follow on telemetry follow cardiology recommendations  8. D/c planning patient has weakness in the condition secondary to her multiple medical problems including CHF and respiratory failure. Patient is not wanting to go to rehab.  She wishes to go home. Her son lives with her does not work and is helping take care of her at home. Spoke with patient that will dicuss care Freight forwarder and work out her discharge plan. Palliative care to to follow as out pt also  Case discussed with Care Management/Social Worker. Management plans discussed with the patient, family and they are in agreement.  CODE STATUS: full  DVT Prophylaxis: heparin  TOTAL TIME TAKING CARE OF THIS PATIENT: *25* minutes.  >50% time spent on counselling and coordination  of care  POSSIBLE D/C IN *few* DAYS, DEPENDING ON CLINICAL CONDITION.  Note: This dictation was prepared with Dragon dictation along with smaller phrase technology. Any transcriptional errors that result from this process are unintentional.  Fritzi Mandes M.D on 01/19/2018 at 12:09 PM  Between 7am to 6pm - Pager - [phone]  After 6pm go to [email or web address] - Proofreader  [facility]  Office  [phone]  CC: Primary care physician; Tracie Harrier, MDPatient ID: Regina Burke, female   DOB: 1932/10/10, 84 y.o.   MRN: [id number]

## 2018-01-19 NOTE — Progress Notes (Signed)
   CHIEF COMPLAINT:   Chief Complaint  Patient presents with  . Respiratory Distress    Subjective  Severe SOB Increased WOB +fluid overload    Objective   Examination: PHYSICAL EXAMINATION:  GENERAL:critically ill appearing, +resp distress HEAD: Normocephalic, atraumatic.  EYES: Pupils equal, round, reactive to light.  No scleral icterus.  MOUTH: Moist mucosal membrane. NECK: Supple. No thyromegaly. No nodules. No JVD.  PULMONARY: +rhonchi, +wheezing, on BIPAP CARDIOVASCULAR: S1 and S2. Regular rate and rhythm. No murmurs, rubs, or gallops.  GASTROINTESTINAL: Soft, nontender, -distended. No masses. Positive bowel sounds. No hepatosplenomegaly.  MUSCULOSKELETAL: + edema.  NEUROLOGIC: alert and awake, follows simple commands SKIN:intact,warm,dry, small abrasion left ankle    VITALS:  height is 5\' 4"  (1.626 m) and weight is 199 lb 1.2 oz (90.3 kg). Her oral temperature is 98.1 F (36.7 C). Her blood pressure is 123/51 (abnormal) and her pulse is 70. Her respiration is 19 and oxygen saturation is 96%.   I personally reviewed Labs under Results section.  Radiology Reports Dg Chest Port 1 View  Result Date: 01/19/2018 CLINICAL DATA:  Acute respiratory failure. EXAM: PORTABLE CHEST 1 VIEW COMPARISON:  Radiograph of Jan 16, 2018. FINDINGS: Stable cardiomegaly with central pulmonary vascular congestion. Atherosclerosis of thoracic aorta is noted. Left-sided PICC line is unchanged in position. No pneumothorax is noted. Stable patchy opacities are noted throughout both lungs concerning for inflammation. Stable left basilar opacity is noted concerning for atelectasis or infiltrate with associated effusion. Bony thorax is unremarkable. IMPRESSION: Stable cardiomegaly with central pulmonary vascular congestion. Stable bilateral lung opacities as described above. Stable left basilar opacity concerning for atelectasis or infiltrate with associated effusion. Aortic Atherosclerosis  (ICD10-I70.0). Electronically Signed   By: Marijo Conception, M.D.   On: 01/19/2018 07:07       ASSESSMENT / PLAN: Severe resp failure with Acute on chronic hypercapnic respiratory failure secondary to pneumonia and CHF exacerbation much improved Acute on chronic stage 4 renal failure -progressive cardio-renal syndrome  Will continue on BiPAP at all times for sleep Continue to wean [place] Oxygen Will continue to hold Lasix as per Nephrology discussion Cardiology follow up   Disp:  DNR/DNI  Status, PT consult for Out of bed, transfer out of ICU  Family: will update when available    Cammie Sickle, M.D.  [facility]

## 2018-01-19 NOTE — Progress Notes (Signed)
Foley catheter removed per MD order. Pt is DTV by [phone].

## 2018-01-19 NOTE — Progress Notes (Signed)
[facility], [place] 01/19/18  Subjective:   Patient known to our practice from previous admission This time she presents for cough with thick gray sputum and some shortness of breath Patient has significant history of smoking Admitted for management of respiratory distress Patient feels that her breathing has improved significantly Serum creatinine is also back to baseline 2.13, GFR 20  Objective:  Vital signs in last 24 hours:  Temp:  [97.1 F (36.2 C)-98.1 F (36.7 C)] 97.9 F (36.6 C) (05/25 1423) Pulse Rate:  [69-72] 70 (05/25 1423) Resp:  [12-25] 18 (05/25 1423) BP: (86-159)/(49-113) 159/67 (05/25 1423) SpO2:  [93 %-100 %] 98 % (05/25 1423) Weight:  [90.3 kg (199 lb 1.2 oz)] 90.3 kg (199 lb 1.2 oz) (05/25 0400)  Weight change: 2.2 kg (4 lb 13.6 oz) Filed Weights   01/16/18 0400 01/18/18 0423 01/19/18 0400  Weight: 90.4 kg (199 lb 4.7 oz) 88.1 kg (194 lb 3.6 oz) 90.3 kg (199 lb 1.2 oz)    Intake/Output:    Intake/Output Summary (Last 24 hours) at 01/19/2018 1456 Last data filed at 01/19/2018 1029 Gross per 24 hour  Intake 200 ml  Output 925 ml  Net -725 ml     Physical Exam: General:  no acute distress  HEENT Dry oral mucous membranes  Neck supple  Pulm/lungs left greater than right basilar crackles  CVS/Heart irregular rhythm  Abdomen:  Soft, nontender, nondistended  Extremities: No edema  Neurologic: Alert, oriented, able to answer questions  Skin: warm, dry          Basic Metabolic Panel:  Recent Labs  Lab 01/15/18 1203 01/16/18 0817 01/17/18 0306 01/18/18 0531 01/19/18 0511  NA 138 138 138 137 135  K 3.5 3.8 4.4 4.5 4.6  CL 89* 90* 91* 90* 89*  CO2 33* 36* 35* 32 33*  GLUCOSE 203* 153* 147* 271* 236*  BUN 45* 55* 71* 90* 112*  CREATININE 2.12* 2.47* 2.54* 2.32* 2.13*  CALCIUM 8.8* 8.4* 8.0* 8.0* 8.0*  MG  --   --  2.1 2.4 2.5*  PHOS  --   --  4.1 6.2* 4.8*     CBC: Recent Labs  Lab 01/15/18 1203  01/16/18 0509 01/18/18 0531 01/19/18 0511  WBC 9.6 7.8 3.4* 5.3  NEUTROABS 8.8*  --   --   --   HGB 9.1* 8.8* 7.8* 8.4*  HCT 27.4* 26.1* 22.9* 25.1*  MCV 86.1 86.9 86.2 85.3  PLT 143* 148* 125* 161      Lab Results  Component Value Date   HEPBSAG Negative 01/26/2017   HEPBSAB Non Reactive 01/26/2017      Microbiology:  Recent Results (from the past 240 hour(s))  Urine culture     Status: None   Collection Time: 01/15/18  4:30 AM  Result Value Ref Range Status   Specimen Description   Final    URINE, CLEAN CATCH Performed at [facility], [address], [place], [place] [address]    Special Requests   Final    NONE Performed at [facility], [address] [place], [place] [address]    Culture   Final    NO GROWTH Performed at [facility], [place] [address], [place], [place] [address]    Report Status 01/17/2018 FINAL  Final  Culture, blood (routine x 2)     Status: None (Preliminary result)   Collection Time: 01/15/18 12:04 PM  Result Value Ref Range Status   Specimen Description  BLOOD LEFT HAND  Final   Special Requests BOTTLES DRAWN AEROBIC AND ANAEROBIC BCAV  Final   Culture   Final    NO GROWTH 4 DAYS Performed at [facility] [address] [place], [place] [address]    Report Status PENDING  Incomplete  Culture, blood (routine x 2)     Status: None (Preliminary result)   Collection Time: 01/15/18 12:04 PM  Result Value Ref Range Status   Specimen Description BLOOD LEFT FA  Final   Special Requests   Final    BOTTLES DRAWN AEROBIC AND ANAEROBIC Blood Culture adequate volume   Culture   Final    NO GROWTH 4 DAYS Performed at [facility], [address], [place], [place] [address]    Report Status PENDING  Incomplete  MRSA PCR Screening     Status: None   Collection Time: 01/16/18  4:30 AM  Result Value Ref Range Status   MRSA by PCR NEGATIVE NEGATIVE Final    Comment:        The GeneXpert MRSA Assay  (FDA approved for NASAL specimens only), is one component of a comprehensive MRSA colonization surveillance program. It is not intended to diagnose MRSA infection nor to guide or monitor treatment for MRSA infections. Performed at [facility], [address] [place], [place] [address]     Coagulation Studies: No results for input(s): LABPROT, INR in the last 72 hours.  Urinalysis: No results for input(s): COLORURINE, LABSPEC, PHURINE, GLUCOSEU, HGBUR, BILIRUBINUR, KETONESUR, PROTEINUR, UROBILINOGEN, NITRITE, LEUKOCYTESUR in the last 72 hours.  Invalid input(s): APPERANCEUR    Imaging: Dg Chest Port 1 View  Result Date: 01/19/2018 CLINICAL DATA:  Acute respiratory failure. EXAM: PORTABLE CHEST 1 VIEW COMPARISON:  Radiograph of Jan 16, 2018. FINDINGS: Stable cardiomegaly with central pulmonary vascular congestion. Atherosclerosis of thoracic aorta is noted. Left-sided PICC line is unchanged in position. No pneumothorax is noted. Stable patchy opacities are noted throughout both lungs concerning for inflammation. Stable left basilar opacity is noted concerning for atelectasis or infiltrate with associated effusion. Bony thorax is unremarkable. IMPRESSION: Stable cardiomegaly with central pulmonary vascular congestion. Stable bilateral lung opacities as described above. Stable left basilar opacity concerning for atelectasis or infiltrate with associated effusion. Aortic Atherosclerosis (ICD10-I70.0). Electronically Signed   By: Marijo Conception, M.D.   On: 01/19/2018 07:07     Medications:   . sodium chloride     . ALPRAZolam  0.5 mg Oral QHS  . atorvastatin  20 mg Oral Daily  . budesonide (PULMICORT) nebulizer solution  0.5 mg Nebulization BID  . busPIRone  7.5 mg Oral BID  . cefdinir  300 mg Oral Q12H  . chlorhexidine  15 mL Mouth Rinse BID  . fenofibrate  54 mg Oral Daily  . heparin  5,000 Units Subcutaneous Q8H  . insulin aspart  0-9 Units Subcutaneous Q4H  .  ipratropium-albuterol  3 mL Nebulization BID  . mouth rinse  15 mL Mouth Rinse q12n4p  . metoprolol succinate  25 mg Oral Daily  . polyethylene glycol  17 g Oral Daily  . protein supplement shake  11 oz Oral BID BM  . sertraline  50 mg Oral Daily  . sodium chloride flush  3 mL Intravenous Q12H   sodium chloride, acetaminophen **OR** acetaminophen, bisacodyl, levalbuterol, nitroGLYCERIN, ondansetron **OR** ondansetron (ZOFRAN) IV, pantoprazole, sodium chloride flush  Assessment/ Plan:  84 y.o. female with COPD, chronic diastolic CHF, pacemaker, chronic kidney disease, coronary disease with history of  non-STEMI in April 2016, drug-eluting stent  1.  Acute renal failure, chronic kidney disease stage IV Baseline creatinine appears to be 2.12/GFR 21 from may 20/08/2017 Creatinine is close to baseline Acute renal failure is likely secondary to ATN from concurrent illness/pneumonia Foley has been removed Lasix on hold as clinically volume status appears acceptable; patient appetite is poor  2. A Fib - managed by cardiology - Right heart cath is being considered  3. Anemia - will add iron studies to her labs for the morning - If adequate, will order procrit       LOS: [place] 5/25/20192:56 PM  [place], [place]  Note: This note was prepared with Dragon dictation. Any transcription errors are unintentional

## 2018-01-19 NOTE — Progress Notes (Signed)
Patient has refused bipap for the night.

## 2018-01-19 NOTE — Progress Notes (Signed)
Progress Note  Patient Name: Regina Burke Date of Encounter: 01/19/2018  Primary Cardiologist: No primary care provider on file.   Subjective   Feeling well.  Breathing back to baseline.  Inpatient Medications    Scheduled Meds: . ALPRAZolam  0.5 mg Oral QHS  . atorvastatin  20 mg Oral Daily  . budesonide (PULMICORT) nebulizer solution  0.5 mg Nebulization BID  . busPIRone  7.5 mg Oral BID  . chlorhexidine  15 mL Mouth Rinse BID  . fenofibrate  54 mg Oral Daily  . heparin  5,000 Units Subcutaneous Q8H  . insulin aspart  0-9 Units Subcutaneous Q4H  . ipratropium-albuterol  3 mL Nebulization Q6H  . mouth rinse  15 mL Mouth Rinse q12n4p  . methylPREDNISolone (SOLU-MEDROL) injection  40 mg Intravenous BID  . metoprolol succinate  25 mg Oral Daily  . polyethylene glycol  17 g Oral Daily  . protein supplement shake  11 oz Oral BID BM  . sertraline  50 mg Oral Daily  . sodium chloride flush  3 mL Intravenous Q12H   Continuous Infusions: . sodium chloride    . cefTRIAXone (ROCEPHIN)  IV Stopped (01/19/18 1041)   PRN Meds: sodium chloride, acetaminophen **OR** acetaminophen, bisacodyl, ibuprofen, levalbuterol, nitroGLYCERIN, ondansetron **OR** ondansetron (ZOFRAN) IV, pantoprazole, sodium chloride flush   Vital Signs    Vitals:   01/19/18 0800 01/19/18 0900 01/19/18 1000 01/19/18 1100  BP: (!) 156/113 (!) 135/49 (!) 123/51 (!) 145/64  Pulse: 71 70 70 70  Resp: (!) 25 19 19 18   Temp:      TempSrc:      SpO2: 96% 94% 96% 97%  Weight:      Height:        Intake/Output Summary (Last 24 hours) at 01/19/2018 1137 Last data filed at 01/19/2018 1029 Gross per 24 hour  Intake 200 ml  Output 1325 ml  Net -1125 ml   Filed Weights   01/16/18 0400 01/18/18 0423 01/19/18 0400  Weight: 199 lb 4.7 oz (90.4 kg) 194 lb 3.6 oz (88.1 kg) 199 lb 1.2 oz (90.3 kg)    Telemetry    Atrial fibrillation.  Ventricular pacing. - Personally Reviewed  ECG    n/a - Personally  Reviewed  Physical Exam   VS:  BP (!) 145/64   Pulse 70   Temp 98.1 F (36.7 C) (Oral)   Resp 18   Ht 5\' 4"  (1.626 m)   Wt 199 lb 1.2 oz (90.3 kg)   SpO2 97%   BMI 34.17 kg/m   , BMI Body mass index is 34.17 kg/m. GENERAL: Chronically ill-appearing HEENT: Pupils equal round and reactive, fundi not visualized, oral mucosa unremarkable NECK:  No jugular venous distention, waveform within normal limits, carotid upstroke brisk and symmetric, no bruits, no thyromegaly LYMPHATICS:  No cervical adenopathy LUNGS:  Clear to auscultation bilaterally HEART:  RRR.  PMI not displaced or sustained,S1 and S2 within normal limits, no S3, no S4, no clicks, no rubs, no murmurs ABD:  Flat, positive bowel sounds normal in frequency in pitch, no bruits, no rebound, no guarding, no midline pulsatile mass, no hepatomegaly, no splenomegaly EXT:  2 plus pulses throughout, no edema, no cyanosis no clubbing SKIN:  No rashes no nodules NEURO:  Cranial nerves II through XII grossly intact, motor grossly intact throughout [facility]:  Cognitively intact, oriented to person place and time   [place]  Lab 01/15/18 1203  01/17/18 0306 01/18/18 0531  01/19/18 0511  NA 138   < > 138 137 135  K 3.5   < > 4.4 4.5 4.6  CL 89*   < > 91* 90* 89*  CO2 33*   < > 35* 32 33*  GLUCOSE 203*   < > 147* 271* 236*  BUN 45*   < > 71* 90* 112*  CREATININE 2.12*   < > 2.54* 2.32* 2.13*  CALCIUM 8.8*   < > 8.0* 8.0* 8.0*  PROT 7.4  --   --   --   --   ALBUMIN 3.7  --   --  2.9* 3.1*  AST 33  --   --   --   --   ALT 14  --   --   --   --   ALKPHOS 45  --   --   --   --   BILITOT 1.8*  --   --   --   --   GFRNONAA 21*   < > 17* 18* 20*  GFRAA 24*   < > 19* 21* 24*  ANIONGAP 16*   < > 12 15 13    < > = values in this interval not displayed.     Hematology Recent Labs  Lab 01/16/18 0509 01/18/18 0531 01/19/18 0511  WBC 7.8 3.4* 5.3  RBC 3.00* 2.66* 2.94*  HGB 8.8* 7.8* 8.4*  HCT 26.1* 22.9*  25.1*  MCV 86.9 86.2 85.3  MCH 29.2 29.5 28.5  MCHC 33.7 34.2 33.4  RDW 16.0* 15.8* 15.6*  PLT 148* 125* 161    Cardiac Enzymes Recent Labs  Lab 01/15/18 2118 01/16/18 0148 01/16/18 0817 01/18/18 0531  TROPONINI 0.16* 0.16* 0.12* 0.05*   No results for input(s): TROPIPOC in the last 168 hours.   BNP Recent Labs  Lab 01/15/18 1203  BNP 1,235.0*     DDimer No results for input(s): DDIMER in the last 168 hours.   Radiology    Dg Chest Port 1 View  Result Date: 01/19/2018 CLINICAL DATA:  Acute respiratory failure. EXAM: PORTABLE CHEST 1 VIEW COMPARISON:  Radiograph of Jan 16, 2018. FINDINGS: Stable cardiomegaly with central pulmonary vascular congestion. Atherosclerosis of thoracic aorta is noted. Left-sided PICC line is unchanged in position. No pneumothorax is noted. Stable patchy opacities are noted throughout both lungs concerning for inflammation. Stable left basilar opacity is noted concerning for atelectasis or infiltrate with associated effusion. Bony thorax is unremarkable. IMPRESSION: Stable cardiomegaly with central pulmonary vascular congestion. Stable bilateral lung opacities as described above. Stable left basilar opacity concerning for atelectasis or infiltrate with associated effusion. Aortic Atherosclerosis (ICD10-I70.0). Electronically Signed   By: Marijo Conception, M.D.   On: 01/19/2018 07:07    Cardiac Studies   Echo 01/16/18: - Left ventricle: The cavity size was normal. There was mild concentric hypertrophy. Systolic function was normal. The estimated ejection fraction was in the range of 55% to 60%. Wall motion was normal; there were no regional wall motion abnormalities. The study is not technically sufficient to allow evaluation of LV diastolic function. - Aortic valve: There was very mild stenosis. Peak gradient (S): 20 mm Hg. - Left atrium: The atrium was moderately dilated. - Right atrium: The atrium was mildly dilated. - Tricuspid  valve: There was moderate regurgitation. - Pulmonary arteries: Systolic pressure was moderate to severely elevated. PA peak pressure: 64 mm Hg (S).   Patient Profile     84 y.o. female with CAD s/p PCI, pulmonary hypertension, persistent atrial  fibrillation s/p AV node ablation and BiV ICD, DM, lung cancer, pulmonary hypertension, and COPD here with hypoxic respiratory failure in the setting of acute on chronic diastolic heart failure and pneumonia.   Assessment & Plan    # Hypoxic respiratory failure: # Pulmonary hypertension:  Hypoxic respiratory failure was multifactorial in the setting of pneumonia, chronic hypoxic lung failure, COPD, and acute on chronic diastolic heart failure she feels that her respiratory status is back to baseline.  Lasix held 2/2 AKI.  Renal function slowly improving and is near baseline now.  We will plan to restart oral Lasix tomorrow.  She was net -1 L yesterday without diuresis.  The etiology for her pulmonary hypertension is likely her COPD on chronic home O2 and diastolic heart failure.  Given that she is back to her baseline right heart cath may not be needed this admission. Regarding her work-up for pulmonary hypertension she  already had a sleep study 04/2016 that revealed moderate obstructive sleep apnea.  Unclear whether she is using a CPAP.  Consider whether the rest of the [place] workup is needed as an outpatient (V/Q, PFT, HIV, ANA, RF, ANCA, ACA, anti-CCP, anti-scleroderma, anti-Smith).  LFTs were normal this admission.  # Persistent atrial fibrillation: S/p AV node ablation.  Ms. Eckersley is not on anticoagulation 2/2 prior GIB and chronic anemia.  Continue metoprolol.   # CAD s/p PCI: Continue atorvastatin and fenofibrate.  Continue metoprolol.   For questions or updates, please contact [place] Please consult [email or web address] for contact info under Cardiology/STEMI.      Signed, Skeet Latch, MD  01/19/2018, 11:37 AM

## 2018-01-20 DIAGNOSIS — Z955 Presence of coronary angioplasty implant and graft: Secondary | ICD-10-CM

## 2018-01-20 DIAGNOSIS — I481 Persistent atrial fibrillation: Secondary | ICD-10-CM

## 2018-01-20 LAB — IRON AND TIBC
Iron: 47 ug/dL (ref 28–170)
SATURATION RATIOS: 15 % (ref 10.4–31.8)
TIBC: 322 ug/dL (ref 250–450)
UIBC: 275 ug/dL

## 2018-01-20 LAB — CULTURE, BLOOD (ROUTINE X 2)
Culture: NO GROWTH
Culture: NO GROWTH
Special Requests: ADEQUATE

## 2018-01-20 LAB — BASIC METABOLIC PANEL
Anion gap: 12 (ref 5–15)
BUN: 130 mg/dL — AB (ref 6–20)
CHLORIDE: 92 mmol/L — AB (ref 101–111)
CO2: 34 mmol/L — ABNORMAL HIGH (ref 22–32)
Calcium: 8.6 mg/dL — ABNORMAL LOW (ref 8.9–10.3)
Creatinine, Ser: 1.95 mg/dL — ABNORMAL HIGH (ref 0.44–1.00)
GFR calc Af Amer: 26 mL/min — ABNORMAL LOW (ref >60)
GFR, EST NON AFRICAN AMERICAN: 23 mL/min — AB (ref >60)
GLUCOSE: 123 mg/dL — AB (ref 65–99)
POTASSIUM: 4 mmol/L (ref 3.5–5.1)
Sodium: 138 mmol/L (ref 135–145)

## 2018-01-20 LAB — FERRITIN: Ferritin: 156 ng/mL (ref 11–307)

## 2018-01-20 LAB — GLUCOSE, CAPILLARY
GLUCOSE-CAPILLARY: 130 mg/dL — AB (ref 65–99)
GLUCOSE-CAPILLARY: 117 mg/dL — AB (ref 65–99)
GLUCOSE-CAPILLARY: 177 mg/dL — AB (ref 65–99)
Glucose-Capillary: 195 mg/dL — ABNORMAL HIGH (ref 65–99)

## 2018-01-20 LAB — TRANSFERRIN: TRANSFERRIN: 234 mg/dL (ref 192–382)

## 2018-01-20 MED ORDER — FUROSEMIDE 40 MG PO TABS
40.0000 mg | ORAL_TABLET | Freq: Every day | ORAL | Status: DC
  Administered 2018-01-20 – 2018-01-21 (×2): 40 mg via ORAL
  Filled 2018-01-20 (×2): qty 1

## 2018-01-20 MED ORDER — OXYCODONE HCL 5 MG PO TABS
5.0000 mg | ORAL_TABLET | ORAL | Status: DC | PRN
  Administered 2018-01-20 – 2018-01-21 (×7): 5 mg via ORAL
  Filled 2018-01-20 (×7): qty 1

## 2018-01-20 MED ORDER — BUSPIRONE HCL 5 MG PO TABS
7.5000 mg | ORAL_TABLET | Freq: Two times a day (BID) | ORAL | Status: DC
  Administered 2018-01-20 – 2018-01-22 (×5): 7.5 mg via ORAL
  Filled 2018-01-20 (×6): qty 1.5

## 2018-01-20 MED ORDER — AMLODIPINE BESYLATE 5 MG PO TABS
2.5000 mg | ORAL_TABLET | Freq: Every day | ORAL | Status: DC
  Administered 2018-01-20 – 2018-01-22 (×3): 2.5 mg via ORAL
  Filled 2018-01-20 (×3): qty 1

## 2018-01-20 MED ORDER — SODIUM CHLORIDE 0.9 % IV SOLN
100.0000 mg | Freq: Once | INTRAVENOUS | Status: AC
  Administered 2018-01-20: 100 mg via INTRAVENOUS
  Filled 2018-01-20: qty 5

## 2018-01-20 MED ORDER — EPOETIN ALFA 10000 UNIT/ML IJ SOLN
10000.0000 [IU] | Freq: Once | INTRAMUSCULAR | Status: AC
  Administered 2018-01-20: 10000 [IU] via SUBCUTANEOUS
  Filled 2018-01-20: qty 1

## 2018-01-20 NOTE — Progress Notes (Signed)
[facility], [place] 01/20/18  Subjective:   Patient known to our practice from previous admission; This time she presents for cough with thick gray sputum and some shortness of breath. Patient has significant history of smoking; Admitted for management of respiratory distress  Patient feels that her breathing has improved significantly with Antibiotics and bronchodilators but overall does not feel so great today as she was yesterday Serum creatinine is 1.95 which is below previously considered baseline of 2.13  Objective:  Vital signs in last 24 hours:  Temp:  [97.7 F (36.5 C)-97.9 F (36.6 C)] 97.7 F (36.5 C) (05/26 0754) Pulse Rate:  [68-71] 70 (05/26 0754) Resp:  [16-20] 16 (05/26 0754) BP: (112-166)/(49-110) 146/51 (05/26 0754) SpO2:  [95 %-99 %] 98 % (05/26 0754) Weight:  [90.2 kg (198 lb 12.8 oz)] 90.2 kg (198 lb 12.8 oz) (05/26 0437)  Weight change: -0.125 kg (-4.4 oz) Filed Weights   01/18/18 0423 01/19/18 0400 01/20/18 0437  Weight: 88.1 kg (194 lb 3.6 oz) 90.3 kg (199 lb 1.2 oz) 90.2 kg (198 lb 12.8 oz)    Intake/Output:    Intake/Output Summary (Last 24 hours) at 01/20/2018 1209 Last data filed at 01/20/2018 0500 Gross per 24 hour  Intake 240 ml  Output 1450 ml  Net -1210 ml     Physical Exam: General:  no acute distress  HEENT  moist oral mucous membranes  Neck supple  Pulm/lungs left greater than right basilar crackles  CVS/Heart irregular rhythm  Abdomen:  Soft, nontender, nondistended  Extremities: No edema  Neurologic: Alert, oriented, able to answer questions  Skin: warm, dry          Basic Metabolic Panel:  Recent Labs  Lab 01/16/18 0817 01/17/18 0306 01/18/18 0531 01/19/18 0511 01/20/18 0945  NA 138 138 137 135 138  K 3.8 4.4 4.5 4.6 4.0  CL 90* 91* 90* 89* 92*  CO2 36* 35* 32 33* 34*  GLUCOSE 153* 147* 271* 236* 123*  BUN 55* 71* 90* 112* 130*  CREATININE 2.47* 2.54* 2.32* 2.13* 1.95*  CALCIUM 8.4*  8.0* 8.0* 8.0* 8.6*  MG  --  2.1 2.4 2.5*  --   PHOS  --  4.1 6.2* 4.8*  --      CBC: Recent Labs  Lab 01/15/18 1203 01/16/18 0509 01/18/18 0531 01/19/18 0511  WBC 9.6 7.8 3.4* 5.3  NEUTROABS 8.8*  --   --   --   HGB 9.1* 8.8* 7.8* 8.4*  HCT 27.4* 26.1* 22.9* 25.1*  MCV 86.1 86.9 86.2 85.3  PLT 143* 148* 125* 161      Lab Results  Component Value Date   HEPBSAG Negative 01/26/2017   HEPBSAB Non Reactive 01/26/2017      Microbiology:  Recent Results (from the past 240 hour(s))  Urine culture     Status: None   Collection Time: 01/15/18  4:30 AM  Result Value Ref Range Status   Specimen Description   Final    URINE, CLEAN CATCH Performed at [facility], [address] [place], [place] [address]    Special Requests   Final    NONE Performed at [facility], [address] [place], [place] [address]    Culture   Final    NO GROWTH Performed at [facility], [address], [place], [place] [address]    Report Status 01/17/2018 FINAL  Final  Culture, blood (routine x 2)     Status: None  Collection Time: 01/15/18 12:04 PM  Result Value Ref Range Status   Specimen Description BLOOD LEFT HAND  Final   Special Requests BOTTLES DRAWN AEROBIC AND ANAEROBIC BCAV  Final   Culture   Final    NO GROWTH 5 DAYS Performed at [facility], [address] [place], [place] [address]    Report Status 01/20/2018 FINAL  Final  Culture, blood (routine x 2)     Status: None   Collection Time: 01/15/18 12:04 PM  Result Value Ref Range Status   Specimen Description BLOOD LEFT FA  Final   Special Requests   Final    BOTTLES DRAWN AEROBIC AND ANAEROBIC Blood Culture adequate volume   Culture   Final    NO GROWTH 5 DAYS Performed at [facility], [address] [place], [place] [address]    Report Status 01/20/2018 FINAL  Final  MRSA PCR Screening     Status: None   Collection Time: 01/16/18  4:30 AM  Result Value Ref Range  Status   MRSA by PCR NEGATIVE NEGATIVE Final    Comment:        The GeneXpert MRSA Assay (FDA approved for NASAL specimens only), is one component of a comprehensive MRSA colonization surveillance program. It is not intended to diagnose MRSA infection nor to guide or monitor treatment for MRSA infections. Performed at [facility], [address] [place], [place] [address]     Coagulation Studies: No results for input(s): LABPROT, INR in the last 72 hours.  Urinalysis: No results for input(s): COLORURINE, LABSPEC, PHURINE, GLUCOSEU, HGBUR, BILIRUBINUR, KETONESUR, PROTEINUR, UROBILINOGEN, NITRITE, LEUKOCYTESUR in the last 72 hours.  Invalid input(s): APPERANCEUR    Imaging: Dg Chest Port 1 View  Result Date: 01/19/2018 CLINICAL DATA:  Acute respiratory failure. EXAM: PORTABLE CHEST 1 VIEW COMPARISON:  Radiograph of Jan 16, 2018. FINDINGS: Stable cardiomegaly with central pulmonary vascular congestion. Atherosclerosis of thoracic aorta is noted. Left-sided PICC line is unchanged in position. No pneumothorax is noted. Stable patchy opacities are noted throughout both lungs concerning for inflammation. Stable left basilar opacity is noted concerning for atelectasis or infiltrate with associated effusion. Bony thorax is unremarkable. IMPRESSION: Stable cardiomegaly with central pulmonary vascular congestion. Stable bilateral lung opacities as described above. Stable left basilar opacity concerning for atelectasis or infiltrate with associated effusion. Aortic Atherosclerosis (ICD10-I70.0). Electronically Signed   By: Marijo Conception, M.D.   On: 01/19/2018 07:07     Medications:   . sodium chloride     . ALPRAZolam  0.5 mg Oral QHS  . atorvastatin  20 mg Oral Daily  . budesonide (PULMICORT) nebulizer solution  0.5 mg Nebulization BID  . busPIRone  7.5 mg Oral BID  . cefdinir  300 mg Oral Q12H  . chlorhexidine  15 mL Mouth Rinse BID  . fenofibrate  54 mg Oral Daily  .  heparin  5,000 Units Subcutaneous Q8H  . insulin aspart  0-9 Units Subcutaneous TID WC  . ipratropium-albuterol  3 mL Nebulization BID  . mouth rinse  15 mL Mouth Rinse q12n4p  . metoprolol succinate  25 mg Oral Daily  . polyethylene glycol  17 g Oral Daily  . protein supplement shake  11 oz Oral BID BM  . sertraline  50 mg Oral Daily  . sodium chloride flush  3 mL Intravenous Q12H   sodium chloride, acetaminophen **OR** acetaminophen, bisacodyl, levalbuterol, nitroGLYCERIN, ondansetron **OR** ondansetron (ZOFRAN) IV, oxyCODONE, pantoprazole, sodium chloride flush  Assessment/ Plan:  84 y.o. female with COPD, chronic diastolic CHF, pacemaker, chronic kidney disease, coronary disease with history of non-STEMI in April 2016, drug-eluting stent  1.  Acute renal failure, chronic kidney disease stage IV Creatinine is 1.95 which is lower than previously considered baseline of 2.1 Acute renal failure is likely secondary to ATN from concurrent illness/pneumonia Foley has been removed May restart diuretic tomorrow  2. A Fib - managed by cardiology - Right heart cath is being considered  3. Anemia of CKD and iron deficiency - Iron saturation is low.  Add low dose iv iron - Also order procrit x 1       LOS: [place] 5/26/201912:09 PM  [place], [place]  Note: This note was prepared with Dragon dictation. Any transcription errors are unintentional

## 2018-01-20 NOTE — Progress Notes (Signed)
[place] [facility] [place] NAME: Regina Burke    MR#:  [id number]  DATE OF BIRTH:  May 20, 1933  SUBJECTIVE:    transferred out of ICU. UOP 1.7 L Headache Weak. Refused to use BIPAP last nite REVIEW OF SYSTEMS:   Review of Systems  Constitutional: Negative for chills, fever and weight loss.  HENT: Negative for ear discharge, ear pain and nosebleeds.   Eyes: Negative for blurred vision, pain and discharge.  Respiratory: Positive for cough and shortness of breath. Negative for sputum production, wheezing and stridor.   Cardiovascular: Positive for orthopnea. Negative for chest pain, palpitations and PND.  Gastrointestinal: Negative for abdominal pain, diarrhea, nausea and vomiting.  Genitourinary: Negative for frequency and urgency.  Musculoskeletal: Negative for back pain and joint pain.  Neurological: Positive for weakness. Negative for sensory change, speech change and focal weakness.  Psychiatric/Behavioral: Negative for depression and hallucinations. The patient is not nervous/anxious.    Tolerating Diet:yes Tolerating PT: yes  DRUG ALLERGIES:   Allergies  Allergen Reactions  . Ciprofloxacin Shortness Of Breath, Itching and Rash  . Doxycycline Shortness Of Breath, Itching and Rash  . Penicillins Shortness Of Breath, Itching, Rash and Other (See Comments)    Has patient had a PCN reaction causing immediate rash, facial/tongue/throat swelling, SOB or lightheadedness with hypotension: Yes Has patient had a PCN reaction causing severe rash involving mucus membranes or skin necrosis: No Has patient had a PCN reaction that required hospitalization No Has patient had a PCN reaction occurring within the last 10 years: No If all of the above answers are "NO", then may proceed with Cephalosporin use.  . Sulfa Antibiotics Shortness Of Breath, Itching and Rash  . Latex Itching  . Morphine And Related Itching  . Albuterol Sulfate   .  Macrobid [Nitrofurantoin Macrocrystal] Itching  . Cefuroxime Rash    Blisters in mouth    VITALS:  Blood pressure (!) 146/51, pulse 70, temperature 97.7 F (36.5 C), temperature source Oral, resp. rate 16, height 5\' 4"  (1.626 m), weight 90.2 kg (198 lb 12.8 oz), SpO2 98 %.  PHYSICAL EXAMINATION:   Physical Exam  GENERAL:  84 y.o.-year-old patient lying in the bed with no acute distress.  EYES: Pupils equal, round, reactive to light and accommodation. No scleral icterus. Extraocular muscles intact.  HEENT: Head atraumatic, normocephalic. Oropharynx and nasopharynx clear.  NECK:  Supple, no jugular venous distention. No thyroid enlargement, no tenderness.  LUNGS: decreased breath sounds bilaterally, no wheezing, rales, rhonchi. No use of accessory muscles of respiration.  CARDIOVASCULAR: S1, S2 normal. No murmurs, rubs, or gallops.  ABDOMEN: Soft, nontender, nondistended. Bowel sounds present. No organomegaly or mass.  EXTREMITIES: No cyanosis, clubbing  ++edema b/l.    NEUROLOGIC: Cranial nerves II through XII are intact. No focal Motor or sensory deficits b/l.   PSYCHIATRIC:  patient is alert and oriented x 3.  SKIN: No obvious rash, lesion, or ulcer.   LABORATORY PANEL:  CBC Recent Labs  Lab 01/19/18 0511  WBC 5.3  HGB 8.4*  HCT 25.1*  PLT 161    Chemistries  Recent Labs  Lab 01/15/18 1203  01/19/18 0511  NA 138   < > 135  K 3.5   < > 4.6  CL 89*   < > 89*  CO2 33*   < > 33*  GLUCOSE 203*   < > 236*  BUN 45*   < > 112*  CREATININE 2.12*   < >  2.13*  CALCIUM 8.8*   < > 8.0*  MG  --    < > 2.5*  AST 33  --   --   ALT 14  --   --   ALKPHOS 45  --   --   BILITOT 1.8*  --   --    < > = values in this interval not displayed.   Cardiac Enzymes Recent Labs  Lab 01/18/18 0531  TROPONINI 0.05*   RADIOLOGY:  Dg Chest Port 1 View  Result Date: 01/19/2018 CLINICAL DATA:  Acute respiratory failure. EXAM: PORTABLE CHEST 1 VIEW COMPARISON:  Radiograph of Jan 16, 2018. FINDINGS: Stable cardiomegaly with central pulmonary vascular congestion. Atherosclerosis of thoracic aorta is noted. Left-sided PICC line is unchanged in position. No pneumothorax is noted. Stable patchy opacities are noted throughout both lungs concerning for inflammation. Stable left basilar opacity is noted concerning for atelectasis or infiltrate with associated effusion. Bony thorax is unremarkable. IMPRESSION: Stable cardiomegaly with central pulmonary vascular congestion. Stable bilateral lung opacities as described above. Stable left basilar opacity concerning for atelectasis or infiltrate with associated effusion. Aortic Atherosclerosis (ICD10-I70.0). Electronically Signed   By: Marijo Conception, M.D.   On: 01/19/2018 07:07   ASSESSMENT AND PLAN:   Regina Burke  is a 84 y.o. female with a known history of chronic diastolic heart failure pEF, CAD and chronic hypoxic respiratory failure due to COPD who presents today to the emergency room due to shortness of breath.  Patient reports over the past 2 weeks she has had increasing shortness of breath, lower extremity edema, PND and orthopnea.  She also states she has had a productive cough.  She wears 2 L of oxygen at home today was increased to 3 L.  1.  Acute on chronic hypoxic respiratory failure in the setting of acute on chronic diastolic heart failure and pneumonia Wean oxygen to baseline 2 L as tolerated.  2.  Acute on chronic diastolic heart failure with preserved ejection fraction: Continue Lasix IV 60 mg BID--now on hold since 5/23 -creat better today. Will need to resume lasix or torsemide CHF clinic upon discharge Consultation with [facility] cardiology appreciated.  3.  Pneumonia: Patient has multiple allergies As per pharmacy we will continue azithromycin and Rocephin--change to oral abxs (total 7 days--last day 01/21/2018 Pro calcitonin 6.05  4.  Acute  chronic kidney disease stage III:  -This is likely due to prerenal  azotemia from acute diastolic heart failure on chronic. -creat 2.47--2.54--hold lasix--2.12  5.  Chronic hypoxic respiratory failure with COPD: -currently No signs of exacerbation Received IV steroids--now discontinued  6.  CAD: Continue statin and metoprolol  7.  Elevated troponin: This may be due to demand ischemia No cp follow cardiology recommendations  8. D/c planning patient has weakness in the condition secondary to her multiple medical problems including CHF and respiratory failure.   She wishes to go home. Her son lives with her does not work and is helping take care of her at home. Spoke with patient that will dicuss care Freight forwarder and work out her discharge plan. Palliative care to to follow as out pt also  Case discussed with Care Management/Social Worker. Management plans discussed with the patient, family and they are in agreement.  CODE STATUS: full  DVT Prophylaxis: heparin  TOTAL TIME TAKING CARE OF THIS PATIENT: *25* minutes.  >50% time spent on counselling and coordination of care  POSSIBLE D/C IN 1DAYS, DEPENDING ON CLINICAL CONDITION.  Note:  This dictation was prepared with Dragon dictation along with smaller phrase technology. Any transcriptional errors that result from this process are unintentional.  Fritzi Mandes M.D on 01/20/2018 at 9:20 AM  Between 7am to 6pm - Pager - [phone]  After 6pm go to [email or web address] - Proofreader  [facility]  Office  [phone]  CC: Primary care physician; Tracie Harrier, MDPatient ID: Regina Burke, female   DOB: 06-20-1933, 84 y.o.   MRN: [id number]

## 2018-01-20 NOTE — Progress Notes (Addendum)
Progress Note  Patient Name: Regina Burke Date of Encounter: 01/20/2018  Primary Cardiologist: No primary care provider on file.   Subjective   Had a headache overnight.  Feeling short of breath now after going to the bedside commode.  Inpatient Medications    Scheduled Meds: . ALPRAZolam  0.5 mg Oral QHS  . atorvastatin  20 mg Oral Daily  . budesonide (PULMICORT) nebulizer solution  0.5 mg Nebulization BID  . busPIRone  7.5 mg Oral BID  . cefdinir  300 mg Oral Q12H  . chlorhexidine  15 mL Mouth Rinse BID  . epoetin (EPOGEN/PROCRIT) injection  10,000 Units Subcutaneous Once  . fenofibrate  54 mg Oral Daily  . heparin  5,000 Units Subcutaneous Q8H  . insulin aspart  0-9 Units Subcutaneous TID WC  . ipratropium-albuterol  3 mL Nebulization BID  . mouth rinse  15 mL Mouth Rinse q12n4p  . metoprolol succinate  25 mg Oral Daily  . polyethylene glycol  17 g Oral Daily  . protein supplement shake  11 oz Oral BID BM  . sertraline  50 mg Oral Daily  . sodium chloride flush  3 mL Intravenous Q12H   Continuous Infusions: . sodium chloride    . iron sucrose     PRN Meds: sodium chloride, acetaminophen **OR** acetaminophen, bisacodyl, levalbuterol, nitroGLYCERIN, ondansetron **OR** ondansetron (ZOFRAN) IV, oxyCODONE, pantoprazole, sodium chloride flush   Vital Signs    Vitals:   01/20/18 0435 01/20/18 0437 01/20/18 0748 01/20/18 0754  BP: (!) 163/110 (!) 166/74  (!) 146/51  Pulse: 70 68  70  Resp: 20   16  Temp: 97.8 F (36.6 C)   97.7 F (36.5 C)  TempSrc: Oral   Oral  SpO2: 95%  97% 98%  Weight:  198 lb 12.8 oz (90.2 kg)    Height:        Intake/Output Summary (Last 24 hours) at 01/20/2018 1237 Last data filed at 01/20/2018 0500 Gross per 24 hour  Intake 240 ml  Output 1450 ml  Net -1210 ml   Filed Weights   01/18/18 0423 01/19/18 0400 01/20/18 0437  Weight: 194 lb 3.6 oz (88.1 kg) 199 lb 1.2 oz (90.3 kg) 198 lb 12.8 oz (90.2 kg)    Telemetry    Atrial  fibrillation.  Ventricular pacing. - Personally Reviewed  ECG    n/a - Personally Reviewed  Physical Exam  Chronically ill-appearing VS:  BP (!) 146/51 (BP Location: Left Arm)   Pulse 70   Temp 97.7 F (36.5 C) (Oral)   Resp 16   Ht 5\' 4"  (1.626 m)   Wt 198 lb 12.8 oz (90.2 kg)   SpO2 98%   BMI 34.12 kg/m  , BMI Body mass index is 34.12 kg/m. GENERAL:   HEENT: Pupils equal round and reactive, fundi not visualized, oral mucosa unremarkable NECK: JVD 2 cm above clavicle at 45 degrees, waveform within normal limits, carotid upstroke brisk and symmetric, no bruits LUNGS: No poor air movement.  Mild expiratory wheezing HEART:  RRR.  PMI not displaced or sustained,S1 and S2 within normal limits, no S3, no S4, no clicks, no rubs,  murmurs ABD:  Flat, positive bowel sounds normal in frequency in pitch, no bruits, no rebound, no guarding, no midline pulsatile mass, no hepatomegaly, no splenomegaly EXT:  2 plus pulses throughout, trace lower extremity edema, no cyanosis no clubbing SKIN:  No rashes no nodules NEURO:  Cranial nerves II through XII grossly intact, motor grossly intact  throughout [facility]:  Cognitively intact, oriented to person place and time    Labs    Chemistry Recent Labs  Lab 01/15/18 1203  01/18/18 0531 01/19/18 0511 01/20/18 0945  NA 138   < > 137 135 138  K 3.5   < > 4.5 4.6 4.0  CL 89*   < > 90* 89* 92*  CO2 33*   < > 32 33* 34*  GLUCOSE 203*   < > 271* 236* 123*  BUN 45*   < > 90* 112* 130*  CREATININE 2.12*   < > 2.32* 2.13* 1.95*  CALCIUM 8.8*   < > 8.0* 8.0* 8.6*  PROT 7.4  --   --   --   --   ALBUMIN 3.7  --  2.9* 3.1*  --   AST 33  --   --   --   --   ALT 14  --   --   --   --   ALKPHOS 45  --   --   --   --   BILITOT 1.8*  --   --   --   --   GFRNONAA 21*   < > 18* 20* 23*  GFRAA 24*   < > 21* 24* 26*  ANIONGAP 16*   < > 15 13 12    < > = values in this interval not displayed.     Hematology Recent Labs  Lab 01/16/18 0509 01/18/18 0531  01/19/18 0511  WBC 7.8 3.4* 5.3  RBC 3.00* 2.66* 2.94*  HGB 8.8* 7.8* 8.4*  HCT 26.1* 22.9* 25.1*  MCV 86.9 86.2 85.3  MCH 29.2 29.5 28.5  MCHC 33.7 34.2 33.4  RDW 16.0* 15.8* 15.6*  PLT 148* 125* 161    Cardiac Enzymes Recent Labs  Lab 01/15/18 2118 01/16/18 0148 01/16/18 0817 01/18/18 0531  TROPONINI 0.16* 0.16* 0.12* 0.05*   No results for input(s): TROPIPOC in the last 168 hours.   BNP Recent Labs  Lab 01/15/18 1203  BNP 1,235.0*     DDimer No results for input(s): DDIMER in the last 168 hours.   Radiology    Dg Chest Port 1 View  Result Date: 01/19/2018 CLINICAL DATA:  Acute respiratory failure. EXAM: PORTABLE CHEST 1 VIEW COMPARISON:  Radiograph of Jan 16, 2018. FINDINGS: Stable cardiomegaly with central pulmonary vascular congestion. Atherosclerosis of thoracic aorta is noted. Left-sided PICC line is unchanged in position. No pneumothorax is noted. Stable patchy opacities are noted throughout both lungs concerning for inflammation. Stable left basilar opacity is noted concerning for atelectasis or infiltrate with associated effusion. Bony thorax is unremarkable. IMPRESSION: Stable cardiomegaly with central pulmonary vascular congestion. Stable bilateral lung opacities as described above. Stable left basilar opacity concerning for atelectasis or infiltrate with associated effusion. Aortic Atherosclerosis (ICD10-I70.0). Electronically Signed   By: Marijo Conception, M.D.   On: 01/19/2018 07:07    Cardiac Studies   Echo 01/16/18: - Left ventricle: The cavity size was normal. There was mild concentric hypertrophy. Systolic function was normal. The estimated ejection fraction was in the range of 55% to 60%. Wall motion was normal; there were no regional wall motion abnormalities. The study is not technically sufficient to allow evaluation of LV diastolic function. - Aortic valve: There was very mild stenosis. Peak gradient (S): 20 mm Hg. - Left atrium: The  atrium was moderately dilated. - Right atrium: The atrium was mildly dilated. - Tricuspid valve: There was moderate regurgitation. - Pulmonary arteries: Systolic pressure was  moderate to severely elevated. PA peak pressure: 64 mm Hg (S).   Patient Profile     84 y.o. female with CAD s/p PCI, pulmonary hypertension, persistent atrial fibrillation s/p AV node ablation and BiV ICD, DM, lung cancer, pulmonary hypertension, and COPD here with hypoxic respiratory failure in the setting of acute on chronic diastolic heart failure and pneumonia.   Assessment & Plan    # Hypoxic respiratory failure: # Pulmonary hypertension:  Hypoxic respiratory failure was multifactorial in the setting of pneumonia, chronic hypoxic lung failure, COPD, and acute on chronic diastolic heart failure.  She feels that her respiratory status is back to baseline.  Lasix held 2/2 AKI  and renal function has improved significantly.  Creatinine was 1.9 today.  She is mildly volume overloaded despite being net -1.2 L yesterday.  We will start Lasix 40 mg p.o. daily.    The etiology for her pulmonary hypertension is likely her COPD on chronic home O2 and diastolic heart failure.  Given that she is back to her baseline right heart cath may not be needed this admission. Regarding her work-up for pulmonary hypertension she  already had a sleep study 04/2016 that revealed moderate obstructive sleep apnea.  Unclear whether she is using a CPAP.  Consider whether the rest of the [place] workup is needed as an outpatient (V/Q, PFT, HIV, ANA, RF, ANCA, ACA, anti-CCP, anti-scleroderma, anti-Smith).  LFTs were normal this admission.  # Persistent atrial fibrillation: S/p AV node ablation.  Ms. Sidman is not on anticoagulation 2/2 prior GIB and chronic anemia.  Continue metoprolol.   # Hypertension: BP poorly controlled.  Will start amlodipine 2.5mg  daily.  # CAD s/p PCI: Continue atorvastatin and fenofibrate.  Continue metoprolol.   For  questions or updates, please contact [place] Please consult [email or web address] for contact info under Cardiology/STEMI.      Signed, Skeet Latch, MD  01/20/2018, 12:37 PM

## 2018-01-21 LAB — BASIC METABOLIC PANEL
ANION GAP: 12 (ref 5–15)
BUN: 130 mg/dL — ABNORMAL HIGH (ref 6–20)
CO2: 32 mmol/L (ref 22–32)
Calcium: 8.3 mg/dL — ABNORMAL LOW (ref 8.9–10.3)
Chloride: 93 mmol/L — ABNORMAL LOW (ref 101–111)
Creatinine, Ser: 1.98 mg/dL — ABNORMAL HIGH (ref 0.44–1.00)
GFR calc Af Amer: 26 mL/min — ABNORMAL LOW (ref >60)
GFR calc non Af Amer: 22 mL/min — ABNORMAL LOW (ref >60)
GLUCOSE: 146 mg/dL — AB (ref 65–99)
POTASSIUM: 4.4 mmol/L (ref 3.5–5.1)
Sodium: 137 mmol/L (ref 135–145)

## 2018-01-21 LAB — GLUCOSE, CAPILLARY
GLUCOSE-CAPILLARY: 146 mg/dL — AB (ref 65–99)
GLUCOSE-CAPILLARY: 133 mg/dL — AB (ref 65–99)
Glucose-Capillary: 124 mg/dL — ABNORMAL HIGH (ref 65–99)
Glucose-Capillary: 141 mg/dL — ABNORMAL HIGH (ref 65–99)

## 2018-01-21 MED ORDER — FUROSEMIDE 40 MG PO TABS
60.0000 mg | ORAL_TABLET | Freq: Every day | ORAL | Status: DC
  Administered 2018-01-22: 60 mg via ORAL
  Filled 2018-01-21: qty 1

## 2018-01-21 MED ORDER — GUAIFENESIN-DM 100-10 MG/5ML PO SYRP
5.0000 mL | ORAL_SOLUTION | ORAL | Status: DC | PRN
  Administered 2018-01-21: 5 mL via ORAL
  Filled 2018-01-21: qty 5

## 2018-01-21 NOTE — Progress Notes (Signed)
Pt refusing morning labs at this time.

## 2018-01-21 NOTE — Progress Notes (Signed)
[place], [place] 01/21/18  Subjective:  Patient seen at bedside. Appears to be improving. Creatinine down to 1.98.   Objective:  Vital signs in last 24 hours:  Temp:  [98.1 F (36.7 C)-98.5 F (36.9 C)] 98.3 F (36.8 C) (05/27 0737) Pulse Rate:  [70] 70 (05/27 0737) Resp:  [14-18] 16 (05/27 0737) BP: (132-152)/(47-105) 152/50 (05/27 0737) SpO2:  [90 %-94 %] 90 % (05/27 0737) Weight:  [89.5 kg (197 lb 6.4 oz)] 89.5 kg (197 lb 6.4 oz) (05/27 0348)  Weight change: -0.635 kg (-1 lb 6.4 oz) Filed Weights   01/19/18 0400 01/20/18 0437 01/21/18 0348  Weight: 90.3 kg (199 lb 1.2 oz) 90.2 kg (198 lb 12.8 oz) 89.5 kg (197 lb 6.4 oz)    Intake/Output:    Intake/Output Summary (Last 24 hours) at 01/21/2018 1224 Last data filed at 01/21/2018 1044 Gross per 24 hour  Intake 240 ml  Output 550 ml  Net -310 ml     Physical Exam: General: no acute distress  HEENT moist oral mucous membranes  Neck supple  Pulm/lungs basilar rales, normal effort  CVS/Heart irregular rhythm  Abdomen:  Soft, nontender, nondistended  Extremities: No edema  Neurologic: Alert, oriented, able to answer questions  Skin: warm, dry          Basic Metabolic Panel:  Recent Labs  Lab 01/17/18 0306 01/18/18 0531 01/19/18 0511 01/20/18 0945 01/21/18 0657  NA 138 137 135 138 137  K 4.4 4.5 4.6 4.0 4.4  CL 91* 90* 89* 92* 93*  CO2 35* 32 33* 34* 32  GLUCOSE 147* 271* 236* 123* 146*  BUN 71* 90* 112* 130* 130*  CREATININE 2.54* 2.32* 2.13* 1.95* 1.98*  CALCIUM 8.0* 8.0* 8.0* 8.6* 8.3*  MG 2.1 2.4 2.5*  --   --   PHOS 4.1 6.2* 4.8*  --   --      CBC: Recent Labs  Lab 01/15/18 1203 01/16/18 0509 01/18/18 0531 01/19/18 0511  WBC 9.6 7.8 3.4* 5.3  NEUTROABS 8.8*  --   --   --   HGB 9.1* 8.8* 7.8* 8.4*  HCT 27.4* 26.1* 22.9* 25.1*  MCV 86.1 86.9 86.2 85.3  PLT 143* 148* 125* 161      Lab Results  Component Value Date   HEPBSAG Negative 01/26/2017    HEPBSAB Non Reactive 01/26/2017      Microbiology:  Recent Results (from the past 240 hour(s))  Urine culture     Status: None   Collection Time: 01/15/18  4:30 AM  Result Value Ref Range Status   Specimen Description   Final    URINE, CLEAN CATCH Performed at [facility], [address], [place], [place] [address]    Special Requests   Final    NONE Performed at [facility], [address], [place], [place] [address]    Culture   Final    NO GROWTH Performed at [facility], [address], [place], [place] [address]    Report Status 01/17/2018 FINAL  Final  Culture, blood (routine x 2)     Status: None   Collection Time: 01/15/18 12:04 PM  Result Value Ref Range Status   Specimen Description BLOOD LEFT HAND  Final   Special Requests BOTTLES DRAWN AEROBIC AND ANAEROBIC BCAV  Final   Culture   Final    NO GROWTH 5 DAYS Performed at [facility], [address] [place], [place] [address]    Report  Status 01/20/2018 FINAL  Final  Culture, blood (routine x 2)     Status: None   Collection Time: 01/15/18 12:04 PM  Result Value Ref Range Status   Specimen Description BLOOD LEFT FA  Final   Special Requests   Final    BOTTLES DRAWN AEROBIC AND ANAEROBIC Blood Culture adequate volume   Culture   Final    NO GROWTH 5 DAYS Performed at [facility], [address] [place], [place] [address]    Report Status 01/20/2018 FINAL  Final  MRSA PCR Screening     Status: None   Collection Time: 01/16/18  4:30 AM  Result Value Ref Range Status   MRSA by PCR NEGATIVE NEGATIVE Final    Comment:        The GeneXpert MRSA Assay (FDA approved for NASAL specimens only), is one component of a comprehensive MRSA colonization surveillance program. It is not intended to diagnose MRSA infection nor to guide or monitor treatment for MRSA infections. Performed at [facility], [address] [place], [place] [address]      Coagulation Studies: No results for input(s): LABPROT, INR in the last 72 hours.  Urinalysis: No results for input(s): COLORURINE, LABSPEC, PHURINE, GLUCOSEU, HGBUR, BILIRUBINUR, KETONESUR, PROTEINUR, UROBILINOGEN, NITRITE, LEUKOCYTESUR in the last 72 hours.  Invalid input(s): APPERANCEUR    Imaging: No results found.   Medications:   . sodium chloride     . ALPRAZolam  0.5 mg Oral QHS  . amLODipine  2.5 mg Oral Daily  . atorvastatin  20 mg Oral Daily  . budesonide (PULMICORT) nebulizer solution  0.5 mg Nebulization BID  . busPIRone  7.5 mg Oral BID  . chlorhexidine  15 mL Mouth Rinse BID  . fenofibrate  54 mg Oral Daily  . [START ON 01/22/2018] furosemide  60 mg Oral Daily  . heparin  5,000 Units Subcutaneous Q8H  . insulin aspart  0-9 Units Subcutaneous TID WC  . ipratropium-albuterol  3 mL Nebulization BID  . mouth rinse  15 mL Mouth Rinse q12n4p  . metoprolol succinate  25 mg Oral Daily  . polyethylene glycol  17 g Oral Daily  . protein supplement shake  11 oz Oral BID BM  . sertraline  50 mg Oral Daily  . sodium chloride flush  3 mL Intravenous Q12H   sodium chloride, acetaminophen **OR** acetaminophen, bisacodyl, guaiFENesin-dextromethorphan, levalbuterol, nitroGLYCERIN, ondansetron **OR** ondansetron (ZOFRAN) IV, oxyCODONE, pantoprazole, sodium chloride flush  Assessment/ Plan:  84 y.o. female with COPD, chronic diastolic CHF, pacemaker, chronic kidney disease, coronary disease with history of non-STEMI in April 2016, drug-eluting stent  1.  Acute renal failure, chronic kidney disease stage IV -Renal function appears to have stabilized now.  Creatinine 1.98 which is lower than her prior baseline.  However she still has significant azotemia BUN of 130.  Recommend continued monitoring of BUN and creatinine as an outpatient.  2. A Fib -Patient currently on metoprolol.  Heart rate 70 at the moment.  3. Anemia of CKD and iron deficiency -Patient was found to be  iron deficient this admission.  Recommend rechecking serum iron as an outpatient.  She may need to be followed by hematology as an outpatient.       LOS: 6 Regina Burke 5/27/201912:24 PM  [place], [place]  Note: This note was prepared with Dragon dictation. Any transcription errors are unintentional

## 2018-01-21 NOTE — Progress Notes (Signed)
[place] [facility] [place] NAME: Regina Burke    MR#:  [id number]  DATE OF BIRTH:  04/15/1933  SUBJECTIVE:  patient looks and feels a lot better. She worked with physical therapy out in the chair. Son in the room. REVIEW OF SYSTEMS:   Review of Systems  Constitutional: Negative for chills, fever and weight loss.  HENT: Negative for ear discharge, ear pain and nosebleeds.   Eyes: Negative for blurred vision, pain and discharge.  Respiratory: Positive for cough and shortness of breath. Negative for sputum production, wheezing and stridor.   Cardiovascular: Positive for orthopnea. Negative for chest pain, palpitations and PND.  Gastrointestinal: Negative for abdominal pain, diarrhea, nausea and vomiting.  Genitourinary: Negative for frequency and urgency.  Musculoskeletal: Negative for back pain and joint pain.  Neurological: Positive for weakness. Negative for sensory change, speech change and focal weakness.  Psychiatric/Behavioral: Negative for depression and hallucinations. The patient is not nervous/anxious.    Tolerating Diet:yes Tolerating PT: yes  DRUG ALLERGIES:   Allergies  Allergen Reactions  . Ciprofloxacin Shortness Of Breath, Itching and Rash  . Doxycycline Shortness Of Breath, Itching and Rash  . Penicillins Shortness Of Breath, Itching, Rash and Other (See Comments)    Has patient had a PCN reaction causing immediate rash, facial/tongue/throat swelling, SOB or lightheadedness with hypotension: Yes Has patient had a PCN reaction causing severe rash involving mucus membranes or skin necrosis: No Has patient had a PCN reaction that required hospitalization No Has patient had a PCN reaction occurring within the last 10 years: No If all of the above answers are "NO", then may proceed with Cephalosporin use.  . Sulfa Antibiotics Shortness Of Breath, Itching and Rash  . Latex Itching  . Morphine And Related Itching  .  Albuterol Sulfate   . Macrobid [Nitrofurantoin Macrocrystal] Itching  . Cefuroxime Rash    Blisters in mouth    VITALS:  Blood pressure (!) 152/50, pulse 70, temperature 98.3 F (36.8 C), temperature source Oral, resp. rate 16, height 5\' 4"  (1.626 m), weight 89.5 kg (197 lb 6.4 oz), SpO2 90 %.  PHYSICAL EXAMINATION:   Physical Exam  GENERAL:  84 y.o.-year-old patient lying in the bed with no acute distress.  EYES: Pupils equal, round, reactive to light and accommodation. No scleral icterus. Extraocular muscles intact.  HEENT: Head atraumatic, normocephalic. Oropharynx and nasopharynx clear.  NECK:  Supple, no jugular venous distention. No thyroid enlargement, no tenderness.  LUNGS: decreased breath sounds bilaterally, no wheezing, rales, rhonchi. No use of accessory muscles of respiration.  CARDIOVASCULAR: S1, S2 normal. No murmurs, rubs, or gallops.  ABDOMEN: Soft, nontender, nondistended. Bowel sounds present. No organomegaly or mass.  EXTREMITIES: No cyanosis, clubbing  ++edema b/l.    NEUROLOGIC: Cranial nerves II through XII are intact. No focal Motor or sensory deficits b/l.   PSYCHIATRIC:  patient is alert and oriented x 3.  SKIN: No obvious rash, lesion, or ulcer.   LABORATORY PANEL:  CBC Recent Labs  Lab 01/19/18 0511  WBC 5.3  HGB 8.4*  HCT 25.1*  PLT 161    Chemistries  Recent Labs  Lab 01/15/18 1203  01/19/18 0511  01/21/18 0657  NA 138   < > 135   < > 137  K 3.5   < > 4.6   < > 4.4  CL 89*   < > 89*   < > 93*  CO2 33*   < > 33*   < >  32  GLUCOSE 203*   < > 236*   < > 146*  BUN 45*   < > 112*   < > 130*  CREATININE 2.12*   < > 2.13*   < > 1.98*  CALCIUM 8.8*   < > 8.0*   < > 8.3*  MG  --    < > 2.5*  --   --   AST 33  --   --   --   --   ALT 14  --   --   --   --   ALKPHOS 45  --   --   --   --   BILITOT 1.8*  --   --   --   --    < > = values in this interval not displayed.   Cardiac Enzymes Recent Labs  Lab 01/18/18 0531  TROPONINI 0.05*    RADIOLOGY:  No results found. ASSESSMENT AND PLAN:   Regina Burke  is a 84 y.o. female with a known history of chronic diastolic heart failure pEF, CAD and chronic hypoxic respiratory failure due to COPD who presents today to the emergency room due to shortness of breath.  Patient reports over the past 2 weeks she has had increasing shortness of breath, lower extremity edema, PND and orthopnea.  She also states she has had a productive cough.  She wears 2 L of oxygen at home today was increased to 3 L.  1.  Acute on chronic hypoxic respiratory failure in the setting of acute on chronic diastolic heart failure and pneumonia Wean oxygen to baseline 2 L as tolerated.  2.  Acute on chronic diastolic heart failure with preserved ejection fraction: Continue Lasix IV 60 mg BID--now on hold since 5/23---PO Lasix 60 mg daily -creat better today. -Now on Lasix 60 mg daily CHF clinic upon discharge Consultation with [facility] cardiology appreciated.  3.  Pneumonia: Patient has multiple allergies As per pharmacy we will continue azithromycin and Rocephin--change to oral abxs-- patient completed course of antibiotic Pro calcitonin 6.05  4.  Acute  chronic kidney disease stage III:  -This is likely due to prerenal azotemia from acute diastolic heart failure on chronic. -creat 2.47--2.54--hold lasix--2.12  5.  Chronic hypoxic respiratory failure with COPD: No signs of exacerbation Received IV steroids--now discontinued  6.  CAD: Continue statin and metoprolol  7.  Elevated troponin: This may be due to demand ischemia Continue to monitor troponins and follow on telemetry follow cardiology recommendations  8. D/c planning patient has weakness in the condition secondary to her multiple medical problems including CHF and respiratory failure. Patient is not wanting to go to rehab.  She wishes to go home. Her son lives with her does not work and is helping take care of her at home. Spoke with  patient that will dicuss care Freight forwarder and work out her discharge plan. Palliative care to to follow as out pt also  Patient is feeling weak today. She will discharged tomorrow to home with her son  Case discussed with Care Management/Social Worker. Management plans discussed with the patient, family and they are in agreement.  CODE STATUS: full  DVT Prophylaxis: heparin  TOTAL TIME TAKING CARE OF THIS PATIENT: *25* minutes.  >50% time spent on counselling and coordination of care  POSSIBLE D/C IN *few* DAYS, DEPENDING ON CLINICAL CONDITION.  Note: This dictation was prepared with Dragon dictation along with smaller phrase technology. Any transcriptional errors that result from this process  are unintentional.  Fritzi Mandes M.D on 01/21/2018 at 2:36 PM  Between 7am to 6pm - Pager - [phone]  After 6pm go to [email or web address] - Proofreader  [facility]  Office  [phone]  CC: Primary care physician; Tracie Harrier, MDPatient ID: Regina Burke, female   DOB: 22-Oct-1932, 84 y.o.   MRN: [id number]

## 2018-01-21 NOTE — Progress Notes (Signed)
Physical Therapy Treatment Patient Details Name: Regina Burke MRN: [id number] DOB: Mar 21, 1933 Today's Date: 01/21/2018    History of Present Illness Pt admitted for respiratory failure with complaints of SOB and cough. Pt with COPD wearing 2L of O2 chronic, heart failure, and CAD.  Elevated troponin noted, however downtrending and per MD is due to demand ischemia. Pt is currently at 3L of O2 this date.     PT Comments    Pt finished commode/bath with nursing.  Some fatigue noted but wanted to get up in recliner.  Sats 86% after nursing tasks.  Rest given and increased to 90%.  To edge of bed with min assist and use of rail (has hospital bed at home).  Stood with min assist to walker with mod verbal cues for hand placements and techniques.  Pt with one hand on front walker bar and the other reaching for chair.  Safety education given and she corrected herself and used walker appropriately to chair.  She did sit before turning fully due to fatigue.  Sats decreased to 80% with O2 support and pt was given time for deep breathing and they returned to 88-90% in chair.  Declined further activity due to fatigue.  Discussed at length discharge plan.  Stated she wants to go home vs rehab.  "I've been to [facility] 3 times and I'm not going back"  She did state she was open to other facilities if necessary.  Stated she has 3 threshold type steps into home.  Has walker, bedside commode and hospital bed.  At baseline she was able to use walker in her home but used wheelchair outside.  At this time, she is heavily dependant on walker and +1 assist for transfers and care on/off commode.  She plans to use wheelchair to navigate in her home and transfer only from surfaces until she is stronger.  Pt stated her son lives with her and provides meals etc and can assist with tranfers but she is not comfortable with him providing personal care.  Is interested in home health for bathing assistance.  Pt will need help initially  for bathroom tasks and she stated her daughter who lives down the road can help.    SNF remains appropriate at discharge to increase her overall functional independence, strength, safety and medical monitoring due to O2 sats.  She is not near her baseline and would benefit from further interventions.  If pt refuses SNF, she will need +1 assist for all mobility in the home and for personal care and be wheelchair dependant for mobility in the home.   Follow Up Recommendations  SNF     Equipment Recommendations  None recommended by PT    Recommendations for Other Services       Precautions / Restrictions Precautions Precautions: Fall Restrictions Weight Bearing Restrictions: No    Mobility  Bed Mobility Overal bed mobility: Needs Assistance Bed Mobility: Supine to Sit     Supine to sit: Min assist     General bed mobility comments: with verbal cues  Transfers Overall transfer level: Needs assistance Equipment used: Rolling walker (2 wheeled) Transfers: Sit to/from Stand Sit to Stand: Min assist         General transfer comment: verbal cues for hand placement and technique  Ambulation/Gait Ambulation/Gait assistance: Min assist Ambulation Distance (Feet): 3 Feet Assistive device: Rolling walker (2 wheeled) Gait Pattern/deviations: Step-to pattern;Trunk flexed   Gait velocity interpretation: <1.8 ft/sec, indicate of risk for recurrent falls  Stairs             Wheelchair Mobility    Modified Rankin (Stroke Patients Only)       Balance Overall balance assessment: Needs assistance Sitting-balance support: Feet supported;Bilateral upper extremity supported Sitting balance-Leahy Scale: Fair     Standing balance support: Bilateral upper extremity supported Standing balance-Leahy Scale: Fair                              Cognition Arousal/Alertness: Awake/alert Behavior During Therapy: WFL for tasks assessed/performed Overall  Cognitive Status: Within Functional Limits for tasks assessed                                        Exercises      General Comments        Pertinent Vitals/Pain Pain Assessment: No/denies pain    Home Living                      Prior Function            PT Goals (current goals can now be found in the care plan section) Progress towards PT goals: Progressing toward goals    Frequency    Min 2X/week      PT Plan Current plan remains appropriate    Co-evaluation              AM-PAC PT "6 Clicks" Daily Activity  Outcome Measure  Difficulty turning over in bed (including adjusting bedclothes, sheets and blankets)?: Unable Difficulty moving from lying on back to sitting on the side of the bed? : Unable Difficulty sitting down on and standing up from a chair with arms (e.g., wheelchair, bedside commode, etc,.)?: Unable Help needed moving to and from a bed to chair (including a wheelchair)?: A Little Help needed walking in hospital room?: A Lot Help needed climbing 3-5 steps with a railing? : A Lot 6 Click Score: 10    End of Session Equipment Utilized During Treatment: Gait belt;Oxygen Activity Tolerance: Patient limited by fatigue Patient left: in chair;with chair alarm set;with call bell/phone within reach Nurse Communication: Mobility status       Time: 1101-1119 PT Time Calculation (min) (ACUTE ONLY): 18 min  Charges:  $Therapeutic Activity: 8-22 mins                    G Codes:       Chesley Noon, PTA 01/21/18, 11:46 AM

## 2018-01-21 NOTE — Clinical Social Work Note (Signed)
CSW received referral for SNF.  Case discussed with case manager and plan is to discharge home with home health.  CSW to sign off please re-consult if social work needs arise.  Shalona Harbour R. Juliyah Mergen, MSW, LCSWA [phone]  

## 2018-01-21 NOTE — Progress Notes (Signed)
Pt complains of cough and not being able to cough anything up. RN will enter standing order for robitussin dm. I will continue to assess.

## 2018-01-21 NOTE — Care Management (Signed)
Patient admitted from home with CHF failure and PNA.  Patient lives at home with son. PCP [facility].  Patient states that she has a BSC, Walker, and WC in the home.  Patient has chronic o2 through [place], and confirms that she has portable tanks. PT has assessed patient and recommends SNF.  Patient has declined SNF and wishes to return home with home health services.  Patient states that she has had [facility] in the past and would like to use them again.  Heads up referral made to [facility] with [place]. RNCM checking with MD to see if Lasix protocol to be ordered at discharge.  RNCM following

## 2018-01-21 NOTE — Progress Notes (Signed)
Progress Note  Patient Name: Regina Burke Date of Encounter: 01/21/2018  Primary Cardiologist: Dr. Rockey Situ  Subjective   She reports improvement in shortness of breath but she complains of cough.  Inpatient Medications    Scheduled Meds: . ALPRAZolam  0.5 mg Oral QHS  . amLODipine  2.5 mg Oral Daily  . atorvastatin  20 mg Oral Daily  . budesonide (PULMICORT) nebulizer solution  0.5 mg Nebulization BID  . busPIRone  7.5 mg Oral BID  . cefdinir  300 mg Oral Q12H  . chlorhexidine  15 mL Mouth Rinse BID  . fenofibrate  54 mg Oral Daily  . [START ON 01/22/2018] furosemide  60 mg Oral Daily  . heparin  5,000 Units Subcutaneous Q8H  . insulin aspart  0-9 Units Subcutaneous TID WC  . ipratropium-albuterol  3 mL Nebulization BID  . mouth rinse  15 mL Mouth Rinse q12n4p  . metoprolol succinate  25 mg Oral Daily  . polyethylene glycol  17 g Oral Daily  . protein supplement shake  11 oz Oral BID BM  . sertraline  50 mg Oral Daily  . sodium chloride flush  3 mL Intravenous Q12H   Continuous Infusions: . sodium chloride     PRN Meds: sodium chloride, acetaminophen **OR** acetaminophen, bisacodyl, guaiFENesin-dextromethorphan, levalbuterol, nitroGLYCERIN, ondansetron **OR** ondansetron (ZOFRAN) IV, oxyCODONE, pantoprazole, sodium chloride flush   Vital Signs    Vitals:   01/20/18 1933 01/21/18 0348 01/21/18 0427 01/21/18 0737  BP:  (!) 138/105 (!) 145/49 (!) 152/50  Pulse:  70 70 70  Resp:  18  16  Temp:  98.5 F (36.9 C)  98.3 F (36.8 C)  TempSrc:    Oral  SpO2: 94% 91%  90%  Weight:  197 lb 6.4 oz (89.5 kg)    Height:        Intake/Output Summary (Last 24 hours) at 01/21/2018 0949 Last data filed at 01/21/2018 0539 Gross per 24 hour  Intake -  Output 350 ml  Net -350 ml   Filed Weights   01/19/18 0400 01/20/18 0437 01/21/18 0348  Weight: 199 lb 1.2 oz (90.3 kg) 198 lb 12.8 oz (90.2 kg) 197 lb 6.4 oz (89.5 kg)    Telemetry    Atrial fibrillation with  ventricular paced rhythm- Personally Reviewed  ECG    Not done  Physical Exam   GEN: No acute distress.   Neck: No JVD Cardiac: RRR, no rubs, or gallops.  1 out of 6 systolic murmur in the aortic area Respiratory:  few bibasilar crackles GI: Soft, nontender, non-distended  MS: No deformity. Mild leg edema Neuro:  Nonfocal  Psych: Normal affect   Labs    Chemistry Recent Labs  Lab 01/15/18 1203  01/18/18 0531 01/19/18 0511 01/20/18 0945 01/21/18 0657  NA 138   < > 137 135 138 137  K 3.5   < > 4.5 4.6 4.0 4.4  CL 89*   < > 90* 89* 92* 93*  CO2 33*   < > 32 33* 34* 32  GLUCOSE 203*   < > 271* 236* 123* 146*  BUN 45*   < > 90* 112* 130* 130*  CREATININE 2.12*   < > 2.32* 2.13* 1.95* 1.98*  CALCIUM 8.8*   < > 8.0* 8.0* 8.6* 8.3*  PROT 7.4  --   --   --   --   --   ALBUMIN 3.7  --  2.9* 3.1*  --   --   AST  33  --   --   --   --   --   ALT 14  --   --   --   --   --   ALKPHOS 45  --   --   --   --   --   BILITOT 1.8*  --   --   --   --   --   GFRNONAA 21*   < > 18* 20* 23* 22*  GFRAA 24*   < > 21* 24* 26* 26*  ANIONGAP 16*   < > 15 13 12 12    < > = values in this interval not displayed.     Hematology Recent Labs  Lab 01/16/18 0509 01/18/18 0531 01/19/18 0511  WBC 7.8 3.4* 5.3  RBC 3.00* 2.66* 2.94*  HGB 8.8* 7.8* 8.4*  HCT 26.1* 22.9* 25.1*  MCV 86.9 86.2 85.3  MCH 29.2 29.5 28.5  MCHC 33.7 34.2 33.4  RDW 16.0* 15.8* 15.6*  PLT 148* 125* 161    Cardiac Enzymes Recent Labs  Lab 01/15/18 2118 01/16/18 0148 01/16/18 0817 01/18/18 0531  TROPONINI 0.16* 0.16* 0.12* 0.05*   No results for input(s): TROPIPOC in the last 168 hours.   BNP Recent Labs  Lab 01/15/18 1203  BNP 1,235.0*     DDimer No results for input(s): DDIMER in the last 168 hours.   Radiology    No results found.  Cardiac Studies    Echo 01/16/18: - Left ventricle: The cavity size was normal. There was mild concentric hypertrophy. Systolic function was normal.  The estimated ejection fraction was in the range of 55% to 60%. Wall motion was normal; there were no regional wall motion abnormalities. The study is not technically sufficient to allow evaluation of LV diastolic function. - Aortic valve: There was very mild stenosis. Peak gradient (S): 20 mm Hg. - Left atrium: The atrium was moderately dilated. - Right atrium: The atrium was mildly dilated. - Tricuspid valve: There was moderate regurgitation. - Pulmonary arteries: Systolic pressure was moderate to severely elevated. PA peak pressure: 64 mm Hg (S).    Patient Profile      84 y.o. female with CAD s/p PCI, pulmonary hypertension, persistent atrial fibrillation s/p AV node ablation and BiV ICD, DM, lung cancer, pulmonary hypertension, and COPD here with hypoxic respiratory failure in the setting of acute on chronic diastolic heart failure and pneumonia.    Assessment & Plan    1.  Acute respiratory failure: Likely multifactorial due to pneumonia, chronic lung disease and acute on chronic diastolic heart failure: The patient is overall improved and appears close to baseline.  She is still on antibiotics for pneumonia.  Volume status improved.  2.  Acute on chronic diastolic heart failure with moderate pulmonary hypertension: Initially creatinine worsened with IV diuresis but appears to have stabilized.  She is currently on furosemide 40 mg once daily by mouth.  I increase this to 60 mg once daily given that she appears to be mildly volume overloaded and she does have underlying chronic kidney disease.  3.  Permanent atrial fibrillation: Status post AV nodal ablation.  Not on anticoagulation due to prior GI bleed and chronic anemia.  Continue metoprolol.  4.  Coronary artery disease status post PCI: Stable on medical therapy.   For questions or updates, please contact [facility] Please consult [email or web address] for contact info under Cardiology/STEMI.       Signed, Kathlyn Sacramento, MD  01/21/2018, 9:49 AM

## 2018-01-21 NOTE — Care Management Important Message (Signed)
Important Message  Patient Details  Name: Regina Burke MRN: [id number] Date of Birth: August 12, 1933   Medicare Important Message Given:  Yes    Juliann Pulse A Lenn Volker 01/21/2018, 3:15 PM

## 2018-01-21 NOTE — Progress Notes (Signed)
Pt back on 2L [place], refusing Bi-pap at this time.

## 2018-01-22 ENCOUNTER — Telehealth: Payer: Self-pay | Admitting: Cardiovascular Disease

## 2018-01-22 LAB — GLUCOSE, CAPILLARY: Glucose-Capillary: 108 mg/dL — ABNORMAL HIGH (ref 65–99)

## 2018-01-22 MED ORDER — AMLODIPINE BESYLATE 2.5 MG PO TABS: 2.5000 mg | ORAL_TABLET | Freq: Every day | ORAL | 0 refills | Status: DC

## 2018-01-22 MED ORDER — TORSEMIDE 20 MG PO TABS: 60.0000 mg | ORAL_TABLET | Freq: Every day | ORAL | 0 refills | Status: DC

## 2018-01-22 NOTE — Progress Notes (Signed)
Patient given discharge instructions with son at bedside. IV taken out and tele monitor off. Patient and son verbalized understanding with no further questions at the time. Home health set up. Patient being transported home via family vehicle in stable condition.

## 2018-01-22 NOTE — Plan of Care (Signed)
  Problem: Education: Goal: Knowledge of General Education information will improve Outcome: Adequate for Discharge   Problem: Health Behavior/Discharge Planning: Goal: Ability to manage health-related needs will improve Outcome: Adequate for Discharge   Problem: Clinical Measurements: Goal: Ability to maintain clinical measurements within normal limits will improve Outcome: Adequate for Discharge Goal: Will remain free from infection Outcome: Adequate for Discharge Goal: Diagnostic test results will improve Outcome: Adequate for Discharge Goal: Respiratory complications will improve Outcome: Adequate for Discharge Goal: Cardiovascular complication will be avoided Outcome: Adequate for Discharge   Problem: Activity: Goal: Risk for activity intolerance will decrease Outcome: Adequate for Discharge   Problem: Nutrition: Goal: Adequate nutrition will be maintained Description Pt attempted to eat breakfast-25%/273ml  Outcome: Adequate for Discharge   Problem: Coping: Goal: Level of anxiety will decrease Outcome: Adequate for Discharge   Problem: Elimination: Goal: Will not experience complications related to bowel motility Outcome: Adequate for Discharge Goal: Will not experience complications related to urinary retention Outcome: Adequate for Discharge   Problem: Pain Managment: Goal: General experience of comfort will improve Outcome: Adequate for Discharge   Problem: Safety: Goal: Ability to remain free from injury will improve Outcome: Adequate for Discharge   Problem: Skin Integrity: Goal: Risk for impaired skin integrity will decrease Outcome: Adequate for Discharge

## 2018-01-22 NOTE — Progress Notes (Signed)
Cardiovascular and Pulmonary Nurse Navigator Note:   84 year old female with know history of chronic diastolic heart failure with preserved EF 55-60% (01/16/2018), CAD, and chronic hypoxic respiratory failure due to COPD, who presented to the ED due to SOB, lower extremity edema, PND, orthopnea, and productive cough.  Patient on chronic oxygen at 2 liters via [place].    Patient treated for PNA, acute on chronic diastolic heart failure, and acute on chronic kidney disease stage III this admission.     CHF Education:  Educational session with patient completed. Patient lying in bed with HOB elevated watching TV.    Provided patient with "Living Better with Heart Failure" packet. Note:  CHF is not a new diagnosis for patient.  Briefly reviewed definition of heart failure and signs and symptoms of an exacerbation.   *Reviewed importance of and reason behind checking weight daily in the AM, after using the bathroom, but before getting dressed. Patient?has scales.?Patient stated she does have scales at home and reports that she weighs herself most days.  Upon asking patient what she would do if her weight was up by two pounds overnight, patient replied, "Nothing."    Reviewed the following information with patient:  *Discussed when to call the Dr = weight gain of >2lb overnight of 5lb in a week,  *Discussed yellow zone= call MD: weight gain of >2lb overnight of 5lb in a week, increased swelling, increased SOB when lying down, chest discomfort, dizziness, increased fatigue.  ? Patient reported that she gains weight all over - belly, feet, ankles, legs and hands.   *Red Zone= call 911: struggle to breath, fainting or near fainting, significant chest pain. ?  Instructed patient to call HF Clinic or Dr. Rockey Situ for one or more symptoms in the yellow zone.    *Instructed patient to take medications as prescribed for heart failure. Explained briefly why pt is on the medications (either make you feel better, live  longer or keep you out of the hospital) and discussed monitoring and side effects.    *Reviewed low sodium diet-provided handout of recommended and not recommended foods. Reviewed reading labels with patient. Patient is unable to cook any longer.  Patient lives with son.  Patient reports they mainly eat take out foods/meals.  Discussed low sodium options for take out.  Discussed fluid intake with patient as well. Patient reports that the physician here told her not to drink more than one water pitcher (1000 ml) per day.  Upon asking patient where she feels she has the most trouble in taking steps to live better with HF, patient replied, "Probably the fluids. My son keeps lemonade and water for me to drink.  Also, I have been drinking an iced frappe most days."  This RN explained to patient that 1000 ml would be for the entire 24 hours and would include any liquid - soup, frappes, popsicles, etc.  Patient stated that she could do without the frappes.    *Exercise - Patient has hospital bed as well as a bedside commode.  Patient to be discharged with [facility] with PT, Aide, RN and in-home lasix therapy protocol.    ? *Smoking Cessation?- patient former smoker.    *[facility] - Discussed role of the HF Clinic.  Patient reported that she has been seen in the HF Clinic in the past.  Appointment in the [facility] scheduled for 01/24/2018 at 11:40 a.m.  Dr. Rockey Situ is patient's cardiologist.   Patient thanked me for  my time in reviewing this information with her.    Roanna Epley, RN, BSN, [facility]? [facility]  Cardiovascular &?Pulmonary Nurse Navigator  Direct Line: [phone]  Department Phone #: [phone] Fax: [phone]? Email Address: [email or web address]

## 2018-01-22 NOTE — Discharge Summary (Signed)
Regina Burke at [place] NAME: Regina Burke    MR#:  [id number]  DATE OF BIRTH:  Oct 26, 1932  DATE OF ADMISSION:  01/15/2018 ADMITTING PHYSICIAN: Bettey Costa, MD  DATE OF DISCHARGE: 01/22/2018  PRIMARY CARE PHYSICIAN: Tracie Harrier, MD    ADMISSION DIAGNOSIS:  Hypoxia [R09.02] Healthcare-associated pneumonia [J18.9] Chronic congestive heart failure, unspecified heart failure type ([place]) [I50.9]  DISCHARGE DIAGNOSIS:  Acute on chronic congestive heart failure diastolic acute on chronic hypoxic respiratory failure in the setting of diastolic heart failure and pneumonia pneumonia completed antibiotic treatment  SECONDARY DIAGNOSIS:   Past Medical History:  Diagnosis Date  . Anemia of chronic disease   . Arthritis   . Asthma   . Cardiac resynchronization therapy pacemaker (CRT-P) in place    a. 03/31/16:  Medtronic Percepta Quad CRT-P MRI SureScan (serial Number [id number]) device.  . Chronic diastolic CHF (congestive heart failure) ([place])    a. 10/2015 Echo: EF 55-65%, Gr1 DD, mild MR, mildly dil LA, nl RV fxn, nl PASP;  b. 03/2016 Echo: EF 30-35%, mod MR, mod dil LA; c. 12/2016 Echo: EF 60-65%, no rwma, mild AS, mildly dil LA, PASP 52mmHg.  . CKD (chronic kidney disease), stage IV ([place])   . COPD (chronic obstructive pulmonary disease) ([place])   . Coronary artery disease    a. 11/2014 NSTEMI/PCI: LM nl, LAD 25m, D1 30, LCX mild dzs, OM1 20p, OM2 65m, OM3 90p (2.25x8 Promus Premier DES), RCA nl.   . Cough    CHRONIC AT NIGHT  . Cushing's disease ([place])   . Depression   . Diverticulitis   . Edema    FEET/LEGS  . GERD (gastroesophageal reflux disease)   . Gout   . History of hiatal hernia   . History of pneumonia   . HLD (hyperlipidemia)   . HOH (hard of hearing)   . Hypertensive heart disease   . Lung cancer ([place]) dx'd 2014   S/P radiation 2015  . Migraine   . Mixed Ischemic and Nonischemic Cardiomyopathy ([place])    a. 03/2016  Echo: EF 30-35%;  b. 12/2016 Echo: EF 60-65%.  . Moderate mitral regurgitation   . Multiple allergies   . Myocardial infarction ([place])   . Oxygen deficiency    2LITERS  . Persistent atrial fibrillation ([place])    a. CHADS2VASc ==> 7 (CHF, HTN, age x 2, DM, vascular disease, and gender)--was on renal dosed eliquis but this was d/c'd in 12/2016 in setting of dark stools/anemia;  c. 03/2016 s/p AVN and MDT BiV ICD placement.  . Presence of permanent cardiac pacemaker   . S/P AV nodal ablation    a. on 03/31/16 for persistant afib with CRT-P placement  . Sleep apnea   . Type II diabetes mellitus ([place])     HOSPITAL COURSE:  JoanBroadwayis a84 y.o.femalewith a known history of chronic diastolic heart failure pEF,CAD and chronic hypoxic respiratory failure due to COPD who presents today to the emergency room due to shortness of breath. Patient reports over the past 2 weeks she has had increasing shortness of breath, lower extremity edema, PND and orthopnea. She also states she has had a productive cough. She wears 2 L of oxygen at home today was increased to 3 L.  1. Acute on chronic hypoxic respiratory failure in the setting of acute on chronic diastolic heart failure and pneumonia Wean oxygen to baseline 2 L as tolerated.  2. Acute on chronic diastolic heart failure with  preserved ejection fraction: Continue Lasix IV 60 mg BID--now on hold since 5/23---PO Lasix 60 mg daily -creat better today. -changed to po Lasix 60 mg daily---pt takes torsemide at home. Will d/c on 60 mg torsemide with outpatient follow-up with Dr. Rockey Situ. Diuretic dose be adjusted by cardiology on follow-up in a week CHF clinic upon discharge appointment made for May 30th  2019 Consultation with [facility] cardiology appreciated.  3. Pneumonia: Patient has multiple allergies As per pharmacy we will continue azithromycin and Rocephin--change to oral abxs-- patient completed course of antibiotic Pro calcitonin  6.05  4. Acute  chronic kidney disease stage III:  -This is likely due to prerenal azotemia from acute diastolic heart failure on chronic. -creat 2.47--2.54--hold lasix--2.12--resumed torsemide  5. Chronic hypoxic respiratory failure with COPD: No signs of exacerbation Received IV steroids--now discontinued  6. CAD: Continue statin and metoprolol  7. Elevated troponin: This may be due to demand ischemia Continue to monitor troponins and follow on telemetry follow cardiology recommendations  8. D/c planning patient has weakness in the condition secondary to her multiple medical problems including CHF and respiratory failure. Patient is not wanting to go to rehab.  She wishes to go home. Her son lives with her does not work and is helping take care of her at home. Spoke with patient that will dicuss care Freight forwarder and work out her discharge plan. Palliative care to to follow as out pt also   She will discharged  to home with her son. She appears at baseline  Case discussed with Care Management/Social Worker. Management plans discussed with the patient, family and they are in agreement.  CODE STATUS: DNR     CONSULTS OBTAINED:  Treatment Team:  Minna Merritts, MD Murlean Iba, MD  DRUG ALLERGIES:   Allergies  Allergen Reactions  . Ciprofloxacin Shortness Of Breath, Itching and Rash  . Doxycycline Shortness Of Breath, Itching and Rash  . Penicillins Shortness Of Breath, Itching, Rash and Other (See Comments)    Has patient had a PCN reaction causing immediate rash, facial/tongue/throat swelling, SOB or lightheadedness with hypotension: Yes Has patient had a PCN reaction causing severe rash involving mucus membranes or skin necrosis: No Has patient had a PCN reaction that required hospitalization No Has patient had a PCN reaction occurring within the last 10 years: No If all of the above answers are "NO", then may proceed with Cephalosporin use.  . Sulfa  Antibiotics Shortness Of Breath, Itching and Rash  . Latex Itching  . Morphine And Related Itching  . Albuterol Sulfate   . Macrobid [Nitrofurantoin Macrocrystal] Itching  . Cefuroxime Rash    Blisters in mouth    DISCHARGE MEDICATIONS:   Allergies as of 01/22/2018      Reactions   Ciprofloxacin Shortness Of Breath, Itching, Rash   Doxycycline Shortness Of Breath, Itching, Rash   Penicillins Shortness Of Breath, Itching, Rash, Other (See Comments)   Has patient had a PCN reaction causing immediate rash, facial/tongue/throat swelling, SOB or lightheadedness with hypotension: Yes Has patient had a PCN reaction causing severe rash involving mucus membranes or skin necrosis: No Has patient had a PCN reaction that required hospitalization No Has patient had a PCN reaction occurring within the last 10 years: No If all of the above answers are "NO", then may proceed with Cephalosporin use.   Sulfa Antibiotics Shortness Of Breath, Itching, Rash   Latex Itching   Morphine And Related Itching   Albuterol Sulfate    Macrobid [nitrofurantoin  Macrocrystal] Itching   Cefuroxime Rash   Blisters in mouth      Medication List    TAKE these medications   ALPRAZolam 0.5 MG tablet Commonly known as:  XANAX Take 0.5 mg by mouth at bedtime.   amLODipine 2.5 MG tablet Commonly known as:  NORVASC Take 1 tablet (2.5 mg total) by mouth daily.   atorvastatin 20 MG tablet Commonly known as:  LIPITOR Take 20 mg by mouth daily.   BREO ELLIPTA 100-25 MCG/INH Aepb Generic drug:  fluticasone furoate-vilanterol Inhale 1 puff into the lungs daily as needed (shortness of breath).   busPIRone 7.5 MG tablet Commonly known as:  BUSPAR Take 7.5 mg by mouth 2 (two) times daily.   feeding supplement (ENSURE ENLIVE) Liqd Take 237 mLs by mouth 3 (three) times daily with meals.   fenofibrate 48 MG tablet Commonly known as:  TRICOR Take 48 mg by mouth every evening.   levalbuterol 1.25 MG/0.5ML  nebulizer solution Commonly known as:  XOPENEX Take 1.25 mg by nebulization every 6 (six) hours as needed for wheezing or shortness of breath.   morphine 10 MG/5ML solution Take 5 mg by mouth daily as needed for severe pain.   nitroGLYCERIN 0.4 MG SL tablet Commonly known as:  NITROSTAT Place 1 tablet (0.4 mg total) under the tongue every 5 (five) minutes as needed for chest pain.   pantoprazole 40 MG tablet Commonly known as:  PROTONIX Take 40 mg by mouth daily as needed (heartburn).   sertraline 50 MG tablet Commonly known as:  ZOLOFT Take 1 tablet (50 mg total) by mouth daily.   tiotropium 18 MCG inhalation capsule Commonly known as:  SPIRIVA Place 18 mcg into inhaler and inhale daily as needed (shortness of breath).   TOPROL XL 25 MG 24 hr tablet Generic drug:  metoprolol succinate Take 25 mg by mouth daily.   torsemide 20 MG tablet Commonly known as:  DEMADEX Take 3 tablets (60 mg total) by mouth daily. What changed:  how much to take       If you experience worsening of your admission symptoms, develop shortness of breath, life threatening emergency, suicidal or homicidal thoughts you must seek medical attention immediately by calling 911 or calling your MD immediately  if symptoms less severe.  You Must read complete instructions/literature along with all the possible adverse reactions/side effects for all the Medicines you take and that have been prescribed to you. Take any new Medicines after you have completely understood and accept all the possible adverse reactions/side effects.   Please note  You were cared for by a hospitalist during your hospital stay. If you have any questions about your discharge medications or the care you received while you were in the hospital after you are discharged, you can call the unit and asked to speak with the hospitalist on call if the hospitalist that took care of you is not available. Once you are discharged, your primary care  physician will handle any further medical issues. Please note that NO REFILLS for any discharge medications will be authorized once you are discharged, as it is imperative that you return to your primary care physician (or establish a relationship with a primary care physician if you do not have one) for your aftercare needs so that they can reassess your need for medications and monitor your lab values. Today   SUBJECTIVE  no new complaints   VITAL SIGNS:  Blood pressure (!) 160/57, pulse 72, temperature 98.3 F (36.8  C), temperature source Oral, resp. rate 19, height 5\' 4"  (1.626 m), weight 89.7 kg (197 lb 12.8 oz), SpO2 93 %.  I/O:    Intake/Output Summary (Last 24 hours) at 01/22/2018 0805 Last data filed at 01/21/2018 2100 Gross per 24 hour  Intake 240 ml  Output 700 ml  Net -460 ml    PHYSICAL EXAMINATION:  GENERAL:  84 y.o.-year-old patient lying in the bed with no acute distress.  EYES: Pupils equal, round, reactive to light and accommodation. No scleral icterus. Extraocular muscles intact.  HEENT: Head atraumatic, normocephalic. Oropharynx and nasopharynx clear.  NECK:  Supple, no jugular venous distention. No thyroid enlargement, no tenderness.  LUNGS: Normal breath sounds bilaterally, no wheezing, rales,rhonchi or crepitation. No use of accessory muscles of respiration.  CARDIOVASCULAR: S1, S2 normal. No murmurs, rubs, or gallops.  ABDOMEN: Soft, non-tender, non-distended. Bowel sounds present. No organomegaly or mass.  EXTREMITIES: No pedal edema, cyanosis, or clubbing.  NEUROLOGIC: Cranial nerves II through XII are intact. Muscle strength 5/5 in all extremities. Sensation intact. Gait not checked.  PSYCHIATRIC: The patient is alert and oriented x 3.  SKIN: No obvious rash, lesion, or ulcer.   DATA REVIEW:   CBC  Recent Labs  Lab 01/19/18 0511  WBC 5.3  HGB 8.4*  HCT 25.1*  PLT 161    Chemistries  Recent Labs  Lab 01/15/18 1203  01/19/18 0511   01/21/18 0657  NA 138   < > 135   < > 137  K 3.5   < > 4.6   < > 4.4  CL 89*   < > 89*   < > 93*  CO2 33*   < > 33*   < > 32  GLUCOSE 203*   < > 236*   < > 146*  BUN 45*   < > 112*   < > 130*  CREATININE 2.12*   < > 2.13*   < > 1.98*  CALCIUM 8.8*   < > 8.0*   < > 8.3*  MG  --    < > 2.5*  --   --   AST 33  --   --   --   --   ALT 14  --   --   --   --   ALKPHOS 45  --   --   --   --   BILITOT 1.8*  --   --   --   --    < > = values in this interval not displayed.    Microbiology Results   Recent Results (from the past 240 hour(s))  Urine culture     Status: None   Collection Time: 01/15/18  4:30 AM  Result Value Ref Range Status   Specimen Description   Final    URINE, CLEAN CATCH Performed at [facility], [address] [place], [place] [address]    Special Requests   Final    NONE Performed at [facility], [address], [place], [place] [address]    Culture   Final    NO GROWTH Performed at [facility], [place] [address] [place], [place] [address]    Report Status 01/17/2018 FINAL  Final  Culture, blood (routine x 2)     Status: None   Collection Time: 01/15/18 12:04 PM  Result Value Ref Range Status   Specimen Description BLOOD LEFT HAND  Final   Special Requests BOTTLES DRAWN AEROBIC AND ANAEROBIC BCAV  Final   Culture   Final    NO GROWTH 5 DAYS Performed at [facility], [address] [place], [place] [address]    Report Status 01/20/2018 FINAL  Final  Culture, blood (routine x 2)     Status: None   Collection Time: 01/15/18 12:04 PM  Result Value Ref Range Status   Specimen Description BLOOD LEFT FA  Final   Special Requests   Final    BOTTLES DRAWN AEROBIC AND ANAEROBIC Blood Culture adequate volume   Culture   Final    NO GROWTH 5 DAYS Performed at [facility], [address] [place], [place] [address]    Report Status 01/20/2018 FINAL  Final  MRSA PCR Screening     Status: None   Collection  Time: 01/16/18  4:30 AM  Result Value Ref Range Status   MRSA by PCR NEGATIVE NEGATIVE Final    Comment:        The GeneXpert MRSA Assay (FDA approved for NASAL specimens only), is one component of a comprehensive MRSA colonization surveillance program. It is not intended to diagnose MRSA infection nor to guide or monitor treatment for MRSA infections. Performed at [facility], [address], [place], [place] [address]     RADIOLOGY:  No results found.   Management plans discussed with the patient, family and they are in agreement.  CODE STATUS:     Code Status Orders  (From admission, onward)        Start     Ordered   01/18/18 1821  Do not attempt resuscitation (DNR)  Continuous    Question Answer Comment  In the event of cardiac or respiratory ARREST Do not call a "code blue"   In the event of cardiac or respiratory ARREST Do not perform Intubation, CPR, defibrillation or ACLS   In the event of cardiac or respiratory ARREST Use medication by any route, position, wound care, and other measures to relive pain and suffering. May use oxygen, suction and manual treatment of airway obstruction as needed for comfort.      01/18/18 1820    Code Status History    Date Active Date Inactive Code Status Order ID Comments User Context   01/15/2018 1526 01/18/2018 1820 Full Code [id number]  Bettey Costa, MD Inpatient   01/15/2018 1312 01/15/2018 1526 DNR [id number]  Bettey Costa, MD ED   02/27/2017 0903 03/02/2017 1810 DNR [id number]  Hillary Bow, MD ED   01/31/2017 1523 02/05/2017 2139 Full Code [id number]  Nicholes Mango, MD Inpatient   01/23/2017 1648 01/30/2017 1831 DNR [id number]  Fritzi Mandes, MD ED   01/12/2017 1114 01/13/2017 1836 Full Code [id number]  Epifanio Lesches, MD ED   03/30/2016 1152 04/03/2016 1611 DNR [id number]  Erma Heritage, [place] Inpatient   03/16/2016 1610 03/24/2016 1906 DNR [id number]  Awilda Bill, NP ED   03/08/2016 1543 03/14/2016 1940 Partial Code [id number]   Knox Royalty, NP Inpatient   03/04/2016 2228 03/08/2016 1543 Full Code [id number]  Holley Raring, NP ED   01/19/2016 1956 01/27/2016 1801 Partial Code [id number]  Vaughan Basta, MD ED   11/26/2015 0402 11/26/2015 2059 Full Code [id number]  Silver Huguenin, RN Inpatient   06/14/2015 1857 06/15/2015 2054 DNR [id number]  Colleen Can, MD Inpatient   06/07/2015 2220 06/14/2015 1856 Full Code [id number]  Lytle Butte, MD ED   05/15/2015 1527 05/24/2015 1704 Full Code [id number]  Gladstone Lighter, MD ED   05/08/2015 1123 05/11/2015  [place] Full Code [id number]  Dustin Flock, MD ED   04/15/2015 0001 04/15/2015 1903 Full Code [id number]  Lance Coon, MD Inpatient   12/23/2014 1309 12/24/2014 1717 Full Code [id number]  Burnell Blanks, MD Inpatient   12/23/2014 0422 12/23/2014 1309 Full Code [id number]  Alwyn Pea, MD ED    Advance Directive Documentation     Most Recent Value  Type of Advance Directive  Healthcare Power of Attorney, Living will  Pre-existing out of facility DNR order (yellow form or pink MOST form)  -  "MOST" Form in Place?  -      TOTAL TIME TAKING CARE OF THIS PATIENT: *40* minutes.    Fritzi Mandes M.D on 01/22/2018 at 8:05 AM  Between 7am to 6pm - Pager - [phone] After 6pm go to [email or web address] - password [place] [facility]  Office  [phone]  CC: Primary care physician; Tracie Harrier, MD

## 2018-01-22 NOTE — Telephone Encounter (Signed)
Patient currently admitted at this time. 

## 2018-01-22 NOTE — Telephone Encounter (Signed)
TCM....  Patient is being discharged    They are scheduled to see  Darylene Price 5/30 at 1140 and [place] on 07-02 at 8 am   They were seen for ARF a/c diastolic CHF Perm AFIB CAD   They need to be seen within  1 week

## 2018-01-22 NOTE — Care Management Note (Addendum)
Case Management Note  Patient Details  Name: Regina Burke MRN: [id number] Date of Birth: 1933-05-21   Patient to discharge today.  Corene Cornea with [facility] Notified of discharge.  Heart Failure Home health protocol signed by MD, and provided to [facility].  Patient request information on ACT[facility].  Handout provided. RNCM signing off.   Subjective/Objective:                    Action/Plan:   Expected Discharge Date:  01/22/18               Expected Discharge Plan:  [place]  In-House Referral:     Discharge planning Services  CM Consult  Post Acute Care Choice:  Home Health Choice offered to:  Patient  DME Arranged:    DME Agency:     HH Arranged:  RN, PT, Nurse's Aide [place] Agency:  [place]  Status of Service:  Completed, signed off  If discussed at [place] of Stay Meetings, dates discussed:    Additional Comments:  Beverly Sessions, RN 01/22/2018, 10:03 AM

## 2018-01-23 ENCOUNTER — Telehealth: Payer: Self-pay | Admitting: Cardiovascular Disease

## 2018-01-23 NOTE — Telephone Encounter (Signed)
Spoke with [facility] nurse regarding patient. She reports patient was given instructions on fluid restriction which was not listed in their orders. Reviewed notes entered by Cardiac Rehab with instructions to only have 1000 mls per 24 hours. She verbalized understanding with no further questions at this time.

## 2018-01-23 NOTE — Telephone Encounter (Signed)
Regina Burke with [facility] calling stating pt was just discharged from Hospital   patient states she was told she is on a fluid restriction  She is calling needing to know for there is nothing listed on the paperwork patient has  Would like a call back

## 2018-01-23 NOTE — Telephone Encounter (Signed)
Left voicemail message to call back  

## 2018-01-24 ENCOUNTER — Ambulatory Visit: Payer: Medicare Other | Admitting: Family

## 2018-01-25 NOTE — Telephone Encounter (Signed)
No answer. No voicemail. 

## 2018-01-28 ENCOUNTER — Ambulatory Visit (INDEPENDENT_AMBULATORY_CARE_PROVIDER_SITE_OTHER): Payer: Medicare Other | Admitting: *Deleted

## 2018-01-28 DIAGNOSIS — I255 Ischemic cardiomyopathy: Secondary | ICD-10-CM | POA: Diagnosis not present

## 2018-01-28 DIAGNOSIS — I4819 Other persistent atrial fibrillation: Secondary | ICD-10-CM

## 2018-01-28 NOTE — Progress Notes (Signed)
Remote pacemaker transmission.   

## 2018-01-28 NOTE — Telephone Encounter (Signed)
Patient contacted regarding discharge from [facility] on 01/22/18.   Patient understands to follow up with provider ? On 02/26/18 at 8 am at [place].  Patient understands discharge instructions? Yes   Patient understands medications and regiment? Yes  Patient understands to bring all medications to this visit? Yes

## 2018-02-01 ENCOUNTER — Ambulatory Visit: Payer: Medicare Other | Admitting: Family

## 2018-02-05 ENCOUNTER — Other Ambulatory Visit: Payer: Self-pay | Admitting: Internal Medicine

## 2018-02-05 ENCOUNTER — Ambulatory Visit
Admission: RE | Admit: 2018-02-05 | Discharge: 2018-02-05 | Disposition: A | Discharge status: Home / Self Care | Payer: Medicare Other | Source: Ambulatory Visit | Attending: Internal Medicine | Admitting: Internal Medicine

## 2018-02-05 DIAGNOSIS — R600 Localized edema: Secondary | ICD-10-CM | POA: Insufficient documentation

## 2018-02-07 LAB — CUP PACEART REMOTE DEVICE CHECK
Battery Voltage: 2.99 V
Brady Statistic RA Percent Paced: 0 %
Date Time Interrogation Session: 20190603044001
Implantable Lead Implant Date: 20170804
Implantable Lead Location: 753858
Implantable Lead Location: 753860
Implantable Lead Model: 4298
Implantable Lead Model: 5076
Implantable Pulse Generator Implant Date: 20170804
Lead Channel Impedance Value: 1235 Ohm
Lead Channel Impedance Value: 1235 Ohm
Lead Channel Impedance Value: 3344 Ohm
Lead Channel Impedance Value: 418 Ohm
Lead Channel Impedance Value: 513 Ohm
Lead Channel Impedance Value: 532 Ohm
Lead Channel Impedance Value: 551 Ohm
Lead Channel Pacing Threshold Pulse Width: 1 ms
Lead Channel Sensing Intrinsic Amplitude: 18.125 mV
Lead Channel Setting Pacing Amplitude: 2.5 V
Lead Channel Setting Pacing Pulse Width: 1.5 ms
Lead Channel Setting Sensing Sensitivity: 4 mV
MDC IDC LEAD IMPLANT DT: 20170804
MDC IDC MSMT BATTERY REMAINING LONGEVITY: 68 mo
MDC IDC MSMT LEADCHNL LV IMPEDANCE VALUE: 1064 Ohm
MDC IDC MSMT LEADCHNL LV IMPEDANCE VALUE: 342 Ohm
MDC IDC MSMT LEADCHNL LV IMPEDANCE VALUE: 589 Ohm
MDC IDC MSMT LEADCHNL LV IMPEDANCE VALUE: 760 Ohm
MDC IDC MSMT LEADCHNL LV IMPEDANCE VALUE: 779 Ohm
MDC IDC MSMT LEADCHNL LV IMPEDANCE VALUE: 836 Ohm
MDC IDC MSMT LEADCHNL LV PACING THRESHOLD AMPLITUDE: 2.5 V
MDC IDC MSMT LEADCHNL RA IMPEDANCE VALUE: 3344 Ohm
MDC IDC MSMT LEADCHNL RV PACING THRESHOLD AMPLITUDE: 0.625 V
MDC IDC MSMT LEADCHNL RV PACING THRESHOLD PULSEWIDTH: 0.4 ms
MDC IDC SET LEADCHNL RV PACING AMPLITUDE: 2.5 V
MDC IDC SET LEADCHNL RV PACING PULSEWIDTH: 0.4 ms
MDC IDC STAT BRADY AP VP PERCENT: 0 %
MDC IDC STAT BRADY AP VS PERCENT: 0 %
MDC IDC STAT BRADY AS VP PERCENT: 99.98 %
MDC IDC STAT BRADY AS VS PERCENT: 0.02 %
MDC IDC STAT BRADY RV PERCENT PACED: 99.98 %

## 2018-02-14 ENCOUNTER — Telehealth: Payer: Self-pay | Admitting: Oncology

## 2018-02-14 NOTE — Telephone Encounter (Signed)
Called pt to give Follow-up appt details per request from Dr. Ginette Pitman. No answer and no VM set-up. Mailed patient AVS with scheduled appts. Will attempt to contact patient later today.

## 2018-02-18 NOTE — Progress Notes (Deleted)
[place]  [phone] [phone]) [phone]  ID: RILYNN HABEL OB: September 19, 1940  MR#: [id number]  ASN#:[id number]  Patient Care Team: Tracie Harrier, MD as PCP - General (Internal Medicine) Minna Merritts, MD as Consulting Physician (Cardiology) Alisa Graff, FNP as Nurse Practitioner (Family Medicine) Laverle Hobby, MD as Consulting Physician (Pulmonary Disease) Lavonia Dana, MD as Consulting Physician (Internal Medicine)  CHIEF COMPLAINT: Anemia secondary to chronic renal failure, clinical stage IA adenocarcinoma of right upper lobe lung.  INTERVAL HISTORY: Patient returns to clinic today for repeat laboratory work and further evaluation. She continues to have chronic weakness and fatigue which is unchanged. She has multiple medical complaints that are chronic and unchanged. She was recently enrolled in hospice after her most recent hospital admission. She has no new neurologic complaints. She denies any recent fevers. She denies any chest pain, but has chronic shortness of breath and requires oxygen 24 hours per day. She has no nausea, vomiting, constipation, or diarrhea. She has no urinary complaints. Patient offers no further specific complaints today.  REVIEW OF SYSTEMS:   Review of Systems  Constitutional: Positive for malaise/fatigue. Negative for fever and weight loss.  Respiratory: Positive for shortness of breath. Negative for cough and hemoptysis.   Cardiovascular: Positive for leg swelling. Negative for chest pain.  Gastrointestinal: Negative.  Negative for abdominal pain, blood in stool, diarrhea and melena.  Genitourinary: Negative.   Musculoskeletal: Negative.   Skin: Negative.  Negative for rash.  Neurological: Positive for dizziness and weakness. Negative for focal weakness.  Psychiatric/Behavioral: Negative.  The patient is not nervous/anxious.     As per HPI. Otherwise, a complete review of systems is  negative.  PAST MEDICAL HISTORY: Past Medical History:  Diagnosis Date  . Anemia of chronic disease   . Arthritis   . Asthma   . Cardiac resynchronization therapy pacemaker (CRT-P) in place    a. 03/31/16:  Medtronic Percepta Quad CRT-P MRI SureScan (serial Number [id number]) device.  . Chronic diastolic CHF (congestive heart failure) ([place])    a. 10/2015 Echo: EF 55-65%, Gr1 DD, mild MR, mildly dil LA, nl RV fxn, nl PASP;  b. 03/2016 Echo: EF 30-35%, mod MR, mod dil LA; c. 12/2016 Echo: EF 60-65%, no rwma, mild AS, mildly dil LA, PASP 33mmHg.  . CKD (chronic kidney disease), stage IV ([place])   . COPD (chronic obstructive pulmonary disease) ([place])   . Coronary artery disease    a. 11/2014 NSTEMI/PCI: LM nl, LAD 5m, D1 30, LCX mild dzs, OM1 20p, OM2 61m, OM3 90p (2.25x8 Promus Premier DES), RCA nl.   . Cough    CHRONIC AT NIGHT  . Cushing's disease ([place])   . Depression   . Diverticulitis   . Edema    FEET/LEGS  . GERD (gastroesophageal reflux disease)   . Gout   . History of hiatal hernia   . History of pneumonia   . HLD (hyperlipidemia)   . HOH (hard of hearing)   . Hypertensive heart disease   . Lung cancer ([place]) dx'd 2014   S/P radiation 2015  . Migraine   . Mixed Ischemic and Nonischemic Cardiomyopathy ([place])    a. 03/2016 Echo: EF 30-35%;  b. 12/2016 Echo: EF 60-65%.  . Moderate mitral regurgitation   . Multiple allergies   . Myocardial infarction ([place])   . Oxygen deficiency    2LITERS  . Persistent atrial fibrillation ([place])    a. CHADS2VASc ==> 7 (CHF, HTN, age x 2,  DM, vascular disease, and gender)--was on renal dosed eliquis but this was d/c'd in 12/2016 in setting of dark stools/anemia;  c. 03/2016 s/p AVN and MDT BiV ICD placement.  . Presence of permanent cardiac pacemaker   . S/P AV nodal ablation    a. on 03/31/16 for persistant afib with CRT-P placement  . Sleep apnea   . Type II diabetes mellitus ([place])     PAST SURGICAL HISTORY: Past Surgical History:  Procedure  Laterality Date  . ABDOMINAL HYSTERECTOMY    . ABLATION     July 2017  . ADRENALECTOMY Left 1980's   "[place]"  . APPENDECTOMY    . BREAST CYST EXCISION Left   . CATARACT EXTRACTION W/PHACO Right 12/28/2015   Procedure: CATARACT EXTRACTION PHACO AND INTRAOCULAR LENS PLACEMENT (IOC);  Surgeon: Birder Robson, MD;  Location: [facility];  Service: Ophthalmology;  Laterality: Right;  [place] 48.4   . CATARACT EXTRACTION W/PHACO Left 11/14/2016   Procedure: CATARACT EXTRACTION PHACO AND INTRAOCULAR LENS PLACEMENT (IOC);  Surgeon: Birder Robson, MD;  Location: [facility];  Service: Ophthalmology;  Laterality: Left;  [place] 59.8 AP% 18.1 CDE 10.78 Fluid pack lot # [id number]  . CHOLECYSTECTOMY    . CORONARY ANGIOPLASTY WITH STENT PLACEMENT  12/23/2014  . ELECTROPHYSIOLOGIC STUDY N/A 03/08/2016   Procedure: CARDIOVERSION;  Surgeon: Wende Bushy, MD;  Location: [facility];  Service: Cardiovascular;  Laterality: N/A;  . ELECTROPHYSIOLOGIC STUDY N/A 03/07/2016   Procedure: Cardioversion;  Surgeon: Wende Bushy, MD;  Location: [facility];  Service: Cardiovascular;  Laterality: N/A;  . ELECTROPHYSIOLOGIC STUDY N/A 03/31/2016   Procedure: AV Node Ablation;  Surgeon: Will Meredith Leeds, MD;  Location: [place] [facility];  Service: Cardiovascular;  Laterality: N/A;  . EP IMPLANTABLE DEVICE N/A 03/31/2016   Procedure: BiV Pacemaker Insertion CRT-P;  Surgeon: Will Meredith Leeds, MD;  Location: [place] [facility];  Service: Cardiovascular;  Laterality: N/A;  . ESOPHAGOGASTRODUODENOSCOPY (EGD) WITH PROPOFOL N/A 01/26/2017   Procedure: ESOPHAGOGASTRODUODENOSCOPY (EGD) WITH PROPOFOL;  Surgeon: [name], MD;  Location: [facility];  Service: Endoscopy;  Laterality: N/A;  . FRACTURE SURGERY    . HIP PINNING,CANNULATED Left 02/01/2017   Procedure: CANNULATED HIP PINNING;  Surgeon: Lovell Sheehan, MD;  Location: [facility];  Service: Orthopedics;  Laterality: Left;  . INSERT / REPLACE / REMOVE PACEMAKER  02/2016  . LEFT HEART  CATHETERIZATION WITH CORONARY ANGIOGRAM N/A 12/23/2014   Procedure: LEFT HEART CATHETERIZATION WITH CORONARY ANGIOGRAM;  Surgeon: Burnell Blanks, MD;  Location: [facility] CATH LAB;  Service: Cardiovascular;  Laterality: N/A;  . PERCUTANEOUS CORONARY STENT INTERVENTION (PCI-S)  12/23/2014   Procedure: PERCUTANEOUS CORONARY STENT INTERVENTION (PCI-S);  Surgeon: Burnell Blanks, MD;  Location: [facility] CATH LAB;  Service: Cardiovascular;;  Promus 2.25x8  . TONSILLECTOMY    . TRANSTHORACIC ECHOCARDIOGRAM  11/26/2015   Technically difficult study. EF 55-60%. Normal wall motion. GR 1 DD.  . TUBAL LIGATION    . WRIST FRACTURE SURGERY Bilateral ~ 2000    FAMILY HISTORY: Family History  Problem Relation Age of Onset  . Heart disease Mother   . Diabetes Mother   . Osteoarthritis Mother   . Hypertension Mother   . Heart disease Father   . Hypertension Father   . COPD Brother     ADVANCED DIRECTIVES (Y/N):  N  HEALTH MAINTENANCE: Social History   Tobacco Use  . Smoking status: Former Smoker    Packs/day: 1.00    Years: 45.00    Pack years: 45.00  Types: Cigarettes    Last attempt to quit: 04/25/1994    Years since quitting: 23.8  . Smokeless tobacco: Never Used  Substance Use Topics  . Alcohol use: No    Comment: 12/23/2014 "might have a couple mixed drinks/year"  . Drug use: No     Colonoscopy:  PAP:  Bone density:  Lipid panel:  Allergies  Allergen Reactions  . Ciprofloxacin Shortness Of Breath, Itching and Rash  . Doxycycline Shortness Of Breath, Itching and Rash  . Penicillins Shortness Of Breath, Itching, Rash and Other (See Comments)    Has patient had a PCN reaction causing immediate rash, facial/tongue/throat swelling, SOB or lightheadedness with hypotension: Yes Has patient had a PCN reaction causing severe rash involving mucus membranes or skin necrosis: No Has patient had a PCN reaction that required hospitalization No Has patient had a PCN reaction occurring  within the last 10 years: No If all of the above answers are "NO", then may proceed with Cephalosporin use.  . Sulfa Antibiotics Shortness Of Breath, Itching and Rash  . Latex Itching  . Morphine And Related Itching  . Albuterol Sulfate   . Macrobid [Nitrofurantoin Macrocrystal] Itching  . Cefuroxime Rash    Blisters in mouth    Current Outpatient Medications  Medication Sig Dispense Refill  . ALPRAZolam (XANAX) 0.5 MG tablet Take 0.5 mg by mouth at bedtime.    Marland Kitchen amLODipine (NORVASC) 2.5 MG tablet Take 1 tablet (2.5 mg total) by mouth daily. 30 tablet 0  . atorvastatin (LIPITOR) 20 MG tablet Take 20 mg by mouth daily.    . busPIRone (BUSPAR) 7.5 MG tablet Take 7.5 mg by mouth 2 (two) times daily.     . feeding supplement, ENSURE ENLIVE, (ENSURE ENLIVE) LIQD Take 237 mLs by mouth 3 (three) times daily with meals. 237 mL 12  . fenofibrate (TRICOR) 48 MG tablet Take 48 mg by mouth every evening.     . fluticasone furoate-vilanterol (BREO ELLIPTA) 100-25 MCG/INH AEPB Inhale 1 puff into the lungs daily as needed (shortness of breath).     Marland Kitchen levalbuterol (XOPENEX) 1.25 MG/0.5ML nebulizer solution Take 1.25 mg by nebulization every 6 (six) hours as needed for wheezing or shortness of breath. (Patient not taking: Reported on 01/15/2018) 1 each 12  . metoprolol succinate (TOPROL XL) 25 MG 24 hr tablet Take 25 mg by mouth daily.     Marland Kitchen morphine 10 MG/5ML solution Take 5 mg by mouth daily as needed for severe pain.    . nitroGLYCERIN (NITROSTAT) 0.4 MG SL tablet Place 1 tablet (0.4 mg total) under the tongue every 5 (five) minutes as needed for chest pain. 25 tablet 3  . pantoprazole (PROTONIX) 40 MG tablet Take 40 mg by mouth daily as needed (heartburn).     . sertraline (ZOLOFT) 50 MG tablet Take 1 tablet (50 mg total) by mouth daily. 30 tablet 2  . tiotropium (SPIRIVA) 18 MCG inhalation capsule Place 18 mcg into inhaler and inhale daily as needed (shortness of breath).     . torsemide (DEMADEX) 20  MG tablet Take 3 tablets (60 mg total) by mouth daily. 30 tablet 0   No current facility-administered medications for this visit.     OBJECTIVE: There were no vitals filed for this visit.   There is no height or weight on file to calculate BMI.    ECOG FS:2 - Symptomatic, <50% confined to bed  General: Well-developed, well-nourished, no acute distress. Eyes: Pink conjunctiva, anicteric sclera. Lungs: Diminished breath  sounds bilaterally. Heart: Regular rate and rhythm. No rubs, murmurs, or gallops. Abdomen: Soft, nontender, nondistended. No organomegaly noted, normoactive bowel sounds. Musculoskeletal: No edema, cyanosis, or clubbing. Neuro: Alert, answering all questions appropriately. Cranial nerves grossly intact. Skin: No rashes or petechiae noted. Psych: Normal affect.   LAB RESULTS:  Lab Results  Component Value Date   NA 137 01/21/2018   K 4.4 01/21/2018   CL 93 (L) 01/21/2018   CO2 32 01/21/2018   GLUCOSE 146 (H) 01/21/2018   BUN 130 (H) 01/21/2018   CREATININE 1.98 (H) 01/21/2018   CALCIUM 8.3 (L) 01/21/2018   PROT 7.4 01/15/2018   ALBUMIN 3.1 (L) 01/19/2018   AST 33 01/15/2018   ALT 14 01/15/2018   ALKPHOS 45 01/15/2018   BILITOT 1.8 (H) 01/15/2018   GFRNONAA 22 (L) 01/21/2018   GFRAA 26 (L) 01/21/2018    Lab Results  Component Value Date   WBC 5.3 01/19/2018   NEUTROABS 8.8 (H) 01/15/2018   HGB 8.4 (L) 01/19/2018   HCT 25.1 (L) 01/19/2018   MCV 85.3 01/19/2018   PLT 161 01/19/2018   Lab Results  Component Value Date   IRON 47 01/20/2018   TIBC 322 01/20/2018   IRONPCTSAT 15 01/20/2018   Lab Results  Component Value Date   FERRITIN 156 01/20/2018     STUDIES: US Venous Img Lower Bilateral  Result Date: 02/05/2018 CLINICAL DATA:  84 year old female with a history of pain and edema bilateral lower extremities EXAM: BILATERAL LOWER EXTREMITY VENOUS DOPPLER ULTRASOUND TECHNIQUE: Gray-scale sonography with graded compression, as well as color  Doppler and duplex ultrasound were performed to evaluate the lower extremity deep venous systems from the level of the common femoral vein and including the common femoral, femoral, profunda femoral, popliteal and calf veins including the posterior tibial, peroneal and gastrocnemius veins when visible. The superficial great saphenous vein was also interrogated. Spectral Doppler was utilized to evaluate flow at rest and with distal augmentation maneuvers in the common femoral, femoral and popliteal veins. COMPARISON:  None. FINDINGS: RIGHT LOWER EXTREMITY Common Femoral Vein: No evidence of thrombus. Normal compressibility, respiratory phasicity and response to augmentation. Saphenofemoral Junction: No evidence of thrombus. Normal compressibility and flow on color Doppler imaging. Profunda Femoral Vein: No evidence of thrombus. Normal compressibility and flow on color Doppler imaging. Femoral Vein: No evidence of thrombus. Normal compressibility, respiratory phasicity and response to augmentation. Popliteal Vein: No evidence of thrombus. Normal compressibility, respiratory phasicity and response to augmentation. Calf Veins: No evidence of thrombus. Normal compressibility and flow on color Doppler imaging. Superficial Great Saphenous Vein: No evidence of thrombus. Normal compressibility and flow on color Doppler imaging. Other Findings:  None. LEFT LOWER EXTREMITY Common Femoral Vein: No evidence of thrombus. Normal compressibility, respiratory phasicity and response to augmentation. Saphenofemoral Junction: No evidence of thrombus. Normal compressibility and flow on color Doppler imaging. Profunda Femoral Vein: No evidence of thrombus. Normal compressibility and flow on color Doppler imaging. Femoral Vein: No evidence of thrombus. Normal compressibility, respiratory phasicity and response to augmentation. Popliteal Vein: No evidence of thrombus. Normal compressibility, respiratory phasicity and response to  augmentation. Calf Veins: No evidence of thrombus. Normal compressibility and flow on color Doppler imaging. Superficial Great Saphenous Vein: No evidence of thrombus. Normal compressibility and flow on color Doppler imaging. Other Findings:  None. IMPRESSION: Sonographic survey of the bilateral lower extremities negative for DVT Electronically Signed   By: Corrie Mckusick D.O.   On: 02/05/2018 16:06    ASSESSMENT: Anemia secondary  to chronic renal failure, clinical stage IA adenocarcinoma of right upper lobe lung.  PLAN:    1. Anemia secondary to chronic renal failure: Patient's hemoglobin has trended below 10.0, therefore will proceed with 40,000 units of Procrit today. Patient is requesting less frequent follow-up therefore she will return to clinic every 2 months for laboratory work and consideration of Procrit. Return to clinic in 6 months with repeat laboratory work and further evaluation.  2. Clinical stage IA adenocarcinoma of right upper lobe lung: Patient is status post stereotactic radiation therapy. Repeat CT scan on Jan 24, 2017 did not reveal any recurrence or progressive disease. Given patient's multiple comorbidities and declining performance status, no further treatment is recommended. Given the patient is now enrolled in hospice, will not do any further imaging either. Follow-up as above.  3. Chronic renal failure: Since creatinine appears to be at her baseline.  Continue evaluation and treatment per nephrology as needed.   Patient expressed understanding and was in agreement with this plan. She also understands that She can call clinic at any time with any questions, concerns, or complaints.   Cancer Staging Cancer of upper lobe of right lung [facility]) Staging form: Lung, AJCC 7th Edition - Clinical stage from 10/08/2016: Stage IA (T1a, N0, M0) - Signed by Lloyd Huger, MD on 10/08/2016   Lloyd Huger, MD   02/18/2018 11:50 PM

## 2018-02-21 ENCOUNTER — Inpatient Hospital Stay: Payer: Medicare Other | Admitting: Oncology

## 2018-02-21 ENCOUNTER — Inpatient Hospital Stay: Payer: Medicare Other

## 2018-02-21 ENCOUNTER — Inpatient Hospital Stay: Payer: Medicare Other | Attending: Oncology

## 2018-02-25 ENCOUNTER — Encounter: Payer: Self-pay | Admitting: Family

## 2018-02-25 ENCOUNTER — Ambulatory Visit: Payer: Medicare Other | Attending: Family | Admitting: Family

## 2018-02-25 ENCOUNTER — Other Ambulatory Visit
Admission: RE | Admit: 2018-02-25 | Discharge: 2018-02-25 | Disposition: A | Discharge status: Home / Self Care | Payer: Medicare Other | Source: Ambulatory Visit | Attending: Internal Medicine | Admitting: Internal Medicine

## 2018-02-25 VITALS — BP 122/52 | HR 70 | Resp 18 | Ht 64.0 in | Wt 185.4 lb

## 2018-02-25 DIAGNOSIS — R0789 Other chest pain: Secondary | ICD-10-CM | POA: Insufficient documentation

## 2018-02-25 DIAGNOSIS — Z955 Presence of coronary angioplasty implant and graft: Secondary | ICD-10-CM | POA: Diagnosis not present

## 2018-02-25 DIAGNOSIS — Z90710 Acquired absence of both cervix and uterus: Secondary | ICD-10-CM | POA: Insufficient documentation

## 2018-02-25 DIAGNOSIS — I130 Hypertensive heart and chronic kidney disease with heart failure and stage 1 through stage 4 chronic kidney disease, or unspecified chronic kidney disease: Secondary | ICD-10-CM | POA: Insufficient documentation

## 2018-02-25 DIAGNOSIS — I2510 Atherosclerotic heart disease of native coronary artery without angina pectoris: Secondary | ICD-10-CM | POA: Insufficient documentation

## 2018-02-25 DIAGNOSIS — Z825 Family history of asthma and other chronic lower respiratory diseases: Secondary | ICD-10-CM | POA: Diagnosis not present

## 2018-02-25 DIAGNOSIS — E1122 Type 2 diabetes mellitus with diabetic chronic kidney disease: Secondary | ICD-10-CM | POA: Insufficient documentation

## 2018-02-25 DIAGNOSIS — N184 Chronic kidney disease, stage 4 (severe): Secondary | ICD-10-CM | POA: Diagnosis not present

## 2018-02-25 DIAGNOSIS — M1990 Unspecified osteoarthritis, unspecified site: Secondary | ICD-10-CM | POA: Diagnosis not present

## 2018-02-25 DIAGNOSIS — Z9049 Acquired absence of other specified parts of digestive tract: Secondary | ICD-10-CM | POA: Insufficient documentation

## 2018-02-25 DIAGNOSIS — Z87891 Personal history of nicotine dependence: Secondary | ICD-10-CM | POA: Insufficient documentation

## 2018-02-25 DIAGNOSIS — I4819 Other persistent atrial fibrillation: Secondary | ICD-10-CM

## 2018-02-25 DIAGNOSIS — G4733 Obstructive sleep apnea (adult) (pediatric): Secondary | ICD-10-CM | POA: Diagnosis not present

## 2018-02-25 DIAGNOSIS — Z833 Family history of diabetes mellitus: Secondary | ICD-10-CM | POA: Insufficient documentation

## 2018-02-25 DIAGNOSIS — Z885 Allergy status to narcotic agent status: Secondary | ICD-10-CM | POA: Insufficient documentation

## 2018-02-25 DIAGNOSIS — E785 Hyperlipidemia, unspecified: Secondary | ICD-10-CM | POA: Insufficient documentation

## 2018-02-25 DIAGNOSIS — Z882 Allergy status to sulfonamides status: Secondary | ICD-10-CM | POA: Insufficient documentation

## 2018-02-25 DIAGNOSIS — I5032 Chronic diastolic (congestive) heart failure: Secondary | ICD-10-CM | POA: Insufficient documentation

## 2018-02-25 DIAGNOSIS — I255 Ischemic cardiomyopathy: Secondary | ICD-10-CM | POA: Diagnosis not present

## 2018-02-25 DIAGNOSIS — J449 Chronic obstructive pulmonary disease, unspecified: Secondary | ICD-10-CM | POA: Diagnosis not present

## 2018-02-25 DIAGNOSIS — I252 Old myocardial infarction: Secondary | ICD-10-CM | POA: Insufficient documentation

## 2018-02-25 DIAGNOSIS — Z880 Allergy status to penicillin: Secondary | ICD-10-CM | POA: Insufficient documentation

## 2018-02-25 DIAGNOSIS — I481 Persistent atrial fibrillation: Secondary | ICD-10-CM | POA: Diagnosis not present

## 2018-02-25 DIAGNOSIS — Z8249 Family history of ischemic heart disease and other diseases of the circulatory system: Secondary | ICD-10-CM | POA: Insufficient documentation

## 2018-02-25 DIAGNOSIS — Z85118 Personal history of other malignant neoplasm of bronchus and lung: Secondary | ICD-10-CM | POA: Insufficient documentation

## 2018-02-25 DIAGNOSIS — K219 Gastro-esophageal reflux disease without esophagitis: Secondary | ICD-10-CM | POA: Diagnosis not present

## 2018-02-25 DIAGNOSIS — Z9889 Other specified postprocedural states: Secondary | ICD-10-CM | POA: Diagnosis not present

## 2018-02-25 DIAGNOSIS — F329 Major depressive disorder, single episode, unspecified: Secondary | ICD-10-CM | POA: Insufficient documentation

## 2018-02-25 DIAGNOSIS — Z950 Presence of cardiac pacemaker: Secondary | ICD-10-CM | POA: Insufficient documentation

## 2018-02-25 DIAGNOSIS — Z881 Allergy status to other antibiotic agents status: Secondary | ICD-10-CM | POA: Insufficient documentation

## 2018-02-25 DIAGNOSIS — M109 Gout, unspecified: Secondary | ICD-10-CM | POA: Insufficient documentation

## 2018-02-25 DIAGNOSIS — Z79899 Other long term (current) drug therapy: Secondary | ICD-10-CM | POA: Insufficient documentation

## 2018-02-25 DIAGNOSIS — Z8261 Family history of arthritis: Secondary | ICD-10-CM | POA: Insufficient documentation

## 2018-02-25 DIAGNOSIS — I10 Essential (primary) hypertension: Secondary | ICD-10-CM

## 2018-02-25 LAB — TROPONIN I: Troponin I: 0.03 ng/mL (ref <0.03)

## 2018-02-25 LAB — CKMB (ARMC ONLY): CK, MB: 1.6 ng/mL (ref 0.5–5.0)

## 2018-02-25 NOTE — Progress Notes (Signed)
Patient ID: Regina Burke, female    DOB: 1933-01-28, 84 y.o.   MRN: [id number]  HPI  Regina Burke is a 84 y/o female with a history of asthma, lung cancer, CAD, DM, hyperlipidemia, CKD, HTN, COPD, obstructive sleep apnea, GERD, atrial fibrillation, MI, previous tobacco use and chronic heart failure.   Echo report from 01/16/18 reviewed and showed an EF of 55-60% along with moderate TR and an elevated PA pressure of 64 mm Hg.   Admitted 01/15/18 due to acute on chronic HF along with pneumonia. Cardiology and palliative care consults were obtained. Initially given IV lasix which was then held due to renal function and then resumed with oral diuretics. Antibiotics were given for pneumonia. Elevated troponin thought to be due to demand ischemia. Discharged after 7 days.   Regina Burke presents today for a follow-up visit although hasn't been seen since 2017. Regina Burke presents with a chief complaint of moderate fatigue upon minimal exertion. Regina Burke describes this as chronic in nature having been present for several years. Regina Burke has associated shortness of breath, light-headedness, chest pain, pedal edema and rhinorrhea along with this. Regina Burke denies any difficulty sleeping, cough or weight gain. Just saw PCP earlier today and feels quite tired and says that 2 appointments in the same day are just too much for her to do.   Past Medical History:  Diagnosis Date  . Anemia of chronic disease   . Arthritis   . Asthma   . Cardiac resynchronization therapy pacemaker (CRT-P) in place    a. 03/31/16:  Medtronic Percepta Quad CRT-P MRI SureScan (serial Number [id number]) device.  . Chronic diastolic CHF (congestive heart failure) ([place])    a. 10/2015 Echo: EF 55-65%, Gr1 DD, mild MR, mildly dil LA, nl RV fxn, nl PASP;  b. 03/2016 Echo: EF 30-35%, mod MR, mod dil LA; c. 12/2016 Echo: EF 60-65%, no rwma, mild AS, mildly dil LA, PASP 15mmHg.  . CKD (chronic kidney disease), stage IV ([place])   . COPD (chronic obstructive pulmonary disease)  ([place])   . Coronary artery disease    a. 11/2014 NSTEMI/PCI: LM nl, LAD 53m, D1 30, LCX mild dzs, OM1 20p, OM2 22m, OM3 90p (2.25x8 Promus Premier DES), RCA nl.   . Cough    CHRONIC AT NIGHT  . Cushing's disease ([place])   . Depression   . Diverticulitis   . Edema    FEET/LEGS  . GERD (gastroesophageal reflux disease)   . Gout   . History of hiatal hernia   . History of pneumonia   . HLD (hyperlipidemia)   . HOH (hard of hearing)   . Hypertension   . Hypertensive heart disease   . Lung cancer ([place]) dx'd 2014   S/P radiation 2015  . Migraine   . Mixed Ischemic and Nonischemic Cardiomyopathy ([place])    a. 03/2016 Echo: EF 30-35%;  b. 12/2016 Echo: EF 60-65%.  . Moderate mitral regurgitation   . Multiple allergies   . Myocardial infarction ([place])   . Oxygen deficiency    2LITERS  . Persistent atrial fibrillation ([place])    a. CHADS2VASc ==> 7 (CHF, HTN, age x 2, DM, vascular disease, and gender)--was on renal dosed eliquis but this was d/c'd in 12/2016 in setting of dark stools/anemia;  c. 03/2016 s/p AVN and MDT BiV ICD placement.  . Presence of permanent cardiac pacemaker   . S/P AV nodal ablation    a. on 03/31/16 for persistant afib with CRT-P placement  . Sleep  apnea   . Type II diabetes mellitus ([place])    Past Surgical History:  Procedure Laterality Date  . ABDOMINAL HYSTERECTOMY    . ABLATION     July 2017  . ADRENALECTOMY Left 1980's   "[place]"  . APPENDECTOMY    . BREAST CYST EXCISION Left   . CATARACT EXTRACTION W/PHACO Right 12/28/2015   Procedure: CATARACT EXTRACTION PHACO AND INTRAOCULAR LENS PLACEMENT (IOC);  Surgeon: Birder Robson, MD;  Location: [facility];  Service: Ophthalmology;  Laterality: Right;  [place] 48.4   . CATARACT EXTRACTION W/PHACO Left 11/14/2016   Procedure: CATARACT EXTRACTION PHACO AND INTRAOCULAR LENS PLACEMENT (IOC);  Surgeon: Birder Robson, MD;  Location: [facility];  Service: Ophthalmology;  Laterality: Left;  [place] 59.8 AP% 18.1 CDE 10.78 Fluid pack  lot # [id number]  . CHOLECYSTECTOMY    . CORONARY ANGIOPLASTY WITH STENT PLACEMENT  12/23/2014  . ELECTROPHYSIOLOGIC STUDY N/A 03/08/2016   Procedure: CARDIOVERSION;  Surgeon: Wende Bushy, MD;  Location: [facility];  Service: Cardiovascular;  Laterality: N/A;  . ELECTROPHYSIOLOGIC STUDY N/A 03/07/2016   Procedure: Cardioversion;  Surgeon: Wende Bushy, MD;  Location: [facility];  Service: Cardiovascular;  Laterality: N/A;  . ELECTROPHYSIOLOGIC STUDY N/A 03/31/2016   Procedure: AV Node Ablation;  Surgeon: Will Meredith Leeds, MD;  Location: [place] [facility];  Service: Cardiovascular;  Laterality: N/A;  . EP IMPLANTABLE DEVICE N/A 03/31/2016   Procedure: BiV Pacemaker Insertion CRT-P;  Surgeon: Will Meredith Leeds, MD;  Location: [place] [facility];  Service: Cardiovascular;  Laterality: N/A;  . ESOPHAGOGASTRODUODENOSCOPY (EGD) WITH PROPOFOL N/A 01/26/2017   Procedure: ESOPHAGOGASTRODUODENOSCOPY (EGD) WITH PROPOFOL;  Surgeon: [name], MD;  Location: [facility];  Service: Endoscopy;  Laterality: N/A;  . FRACTURE SURGERY    . HIP PINNING,CANNULATED Left 02/01/2017   Procedure: CANNULATED HIP PINNING;  Surgeon: Lovell Sheehan, MD;  Location: [facility];  Service: Orthopedics;  Laterality: Left;  . INSERT / REPLACE / REMOVE PACEMAKER  02/2016  . LEFT HEART CATHETERIZATION WITH CORONARY ANGIOGRAM N/A 12/23/2014   Procedure: LEFT HEART CATHETERIZATION WITH CORONARY ANGIOGRAM;  Surgeon: Burnell Blanks, MD;  Location: [facility] CATH LAB;  Service: Cardiovascular;  Laterality: N/A;  . PERCUTANEOUS CORONARY STENT INTERVENTION (PCI-S)  12/23/2014   Procedure: PERCUTANEOUS CORONARY STENT INTERVENTION (PCI-S);  Surgeon: Burnell Blanks, MD;  Location: [facility] CATH LAB;  Service: Cardiovascular;;  Promus 2.25x8  . TONSILLECTOMY    . TRANSTHORACIC ECHOCARDIOGRAM  11/26/2015   Technically difficult study. EF 55-60%. Normal wall motion. GR 1 DD.  . TUBAL LIGATION    . WRIST FRACTURE SURGERY Bilateral ~ 2000    Family History  Problem Relation Age of Onset  . Heart disease Mother   . Diabetes Mother   . Osteoarthritis Mother   . Hypertension Mother   . Heart disease Father   . Hypertension Father   . COPD Brother    Social History   Tobacco Use  . Smoking status: Former Smoker    Packs/day: 1.00    Years: 45.00    Pack years: 45.00    Types: Cigarettes    Last attempt to quit: 04/25/1994    Years since quitting: 23.8  . Smokeless tobacco: Never Used  Substance Use Topics  . Alcohol use: No    Comment: 12/23/2014 "might have a couple mixed drinks/year"   Allergies  Allergen Reactions  . Ciprofloxacin Shortness Of Breath, Itching and Rash  . Doxycycline Shortness Of Breath, Itching and Rash  . Penicillins Shortness Of Breath, Itching,  Rash and Other (See Comments)    Has patient had a PCN reaction causing immediate rash, facial/tongue/throat swelling, SOB or lightheadedness with hypotension: Yes Has patient had a PCN reaction causing severe rash involving mucus membranes or skin necrosis: No Has patient had a PCN reaction that required hospitalization No Has patient had a PCN reaction occurring within the last 10 years: No If all of the above answers are "NO", then may proceed with Cephalosporin use.  . Sulfa Antibiotics Shortness Of Breath, Itching and Rash  . Latex Itching  . Morphine And Related Itching  . Albuterol Sulfate   . Macrobid [Nitrofurantoin Macrocrystal] Itching  . Cefuroxime Rash    Blisters in mouth   Prior to Admission medications   Medication Sig Start Date End Date Taking? Authorizing Provider  ALPRAZolam Duanne Moron) 0.5 MG tablet Take 0.5 mg by mouth at bedtime. 12/27/16   [provider]  amLODipine (NORVASC) 2.5 MG tablet Take 1 tablet (2.5 mg total) by mouth daily. 01/22/18   Fritzi Mandes, MD  atorvastatin (LIPITOR) 20 MG tablet Take 20 mg by mouth daily.    [provider]  busPIRone (BUSPAR) 7.5 MG tablet Take 7.5 mg by mouth 2 (two) times  daily.  03/13/17 03/13/18  [provider]  feeding supplement, ENSURE ENLIVE, (ENSURE ENLIVE) LIQD Take 237 mLs by mouth 3 (three) times daily with meals. 03/02/17   Fritzi Mandes, MD  fenofibrate (TRICOR) 48 MG tablet Take 48 mg by mouth every evening.     [provider]  fluticasone furoate-vilanterol (BREO ELLIPTA) 100-25 MCG/INH AEPB Inhale 1 puff into the lungs daily as needed (shortness of breath).     [provider]  levalbuterol (XOPENEX) 1.25 MG/0.5ML nebulizer solution Take 1.25 mg by nebulization every 6 (six) hours as needed for wheezing or shortness of breath. Patient not taking: Reported on 01/15/2018 03/02/17   Fritzi Mandes, MD  metoprolol succinate (TOPROL XL) 25 MG 24 hr tablet Take 25 mg by mouth daily.  12/19/16   Minna Merritts, MD  morphine 10 MG/5ML solution Take 5 mg by mouth daily as needed for severe pain.    [provider]  nitroGLYCERIN (NITROSTAT) 0.4 MG SL tablet Place 1 tablet (0.4 mg total) under the tongue every 5 (five) minutes as needed for chest pain. 12/24/14   Barrett, Evelene Croon, PA-C  pantoprazole (PROTONIX) 40 MG tablet Take 40 mg by mouth daily as needed (heartburn).     [provider]  sertraline (ZOLOFT) 50 MG tablet Take 1 tablet (50 mg total) by mouth daily. 03/24/16   Gladstone Lighter, MD  tiotropium (SPIRIVA) 18 MCG inhalation capsule Place 18 mcg into inhaler and inhale daily as needed (shortness of breath).     [provider]  torsemide (DEMADEX) 20 MG tablet Take 3 tablets (60 mg total) by mouth daily. 01/22/18   Fritzi Mandes, MD    Review of Systems  Constitutional: Positive for fatigue.  HENT: Positive for rhinorrhea. Negative for congestion and sore throat.   Eyes: Negative.   Respiratory: Positive for shortness of breath. Negative for cough.   Cardiovascular: Positive for chest pain (a couple of days ago; relieved with NTG) and leg swelling (improving).  Endocrine: Negative.   Genitourinary:  Negative.   Musculoskeletal: Negative for back pain and neck pain.  Allergic/Immunologic: Negative.   Neurological: Positive for light-headedness.  Hematological: Negative for adenopathy. Bruises/bleeds easily.  Psychiatric/Behavioral: Negative for sleep disturbance (sleeping in recliner with oxygen at 3L).  Vitals:   02/25/18 1526  BP: (!) 122/52  Pulse: 70  Resp: 18  SpO2: 100%  Weight: 185 lb 6 oz (84.1 kg)  Height: 5\' 4"  (1.626 m)   Wt Readings from Last 3 Encounters:  02/25/18 185 lb 6 oz (84.1 kg)  01/22/18 197 lb 12.8 oz (89.7 kg)  05/22/17 160 lb 4 oz (72.7 kg)   Lab Results  Component Value Date   CREATININE 1.98 (H) 01/21/2018   CREATININE 1.95 (H) 01/20/2018   CREATININE 2.13 (H) 01/19/2018    Physical Exam  Constitutional: Regina Burke is oriented to person, place, and time. Regina Burke appears well-developed and well-nourished.  HENT:  Head: Normocephalic and atraumatic.  Neck: Normal range of motion. Neck supple. No JVD present.  Cardiovascular: Normal rate. An irregular rhythm present.  Pulmonary/Chest: Effort normal. No respiratory distress. Regina Burke has no wheezes. Regina Burke has no rales.  Abdominal: Soft. Regina Burke exhibits no distension. There is no tenderness.  Musculoskeletal:       Right lower leg: Regina Burke exhibits edema (trace pitting). Regina Burke exhibits no tenderness.       Left lower leg: Regina Burke exhibits edema (trace pitting). Regina Burke exhibits no tenderness.  Neurological: Regina Burke is alert and oriented to person, place, and time.  Skin: Skin is warm and dry.  Psychiatric: Regina Burke has a normal mood and affect. Her behavior is normal.  Nursing note and vitals reviewed.  Assessment & Plan:  1: Chronic heart failure with preserved ejection fraction- - NYHA class III - euvolemic today - weighing daily and says that her weight has been stable. Reminded to call for an overnight weight gain of >2 pounds or a weekly weight gain of >5 pounds - not adding salt and is trying to watch sodium intake. Reminded  to keep daily sodium intake to 2000mg  daily - sees cardiology Sharolyn Douglas) 04/11/18 - BNP 01/15/18 1235.0  2: HTN- - BP looks good today - saw PCP (Hande) earlier today - BMP 01/21/18 reviewed and showed sodium 137, potassium 4.4 and GFR 22  3: Atrial fibrillation- - rate controlled on metoprolol - saw EP Caryl Comes) 05/22/17  Patient did not bring her medications nor a list. Each medication was verbally reviewed with the patient and Regina Burke was encouraged to bring the bottles to every visit to confirm accuracy of list.  Return in 4 months for any questions/problems before then.

## 2018-02-25 NOTE — Patient Instructions (Signed)
Continue weighing daily and call for an overnight weight gain of > 2 pounds or a weekly weight gain of >5 pounds. 

## 2018-02-26 ENCOUNTER — Encounter: Payer: Self-pay | Admitting: Family

## 2018-02-26 ENCOUNTER — Other Ambulatory Visit
Admission: RE | Admit: 2018-02-26 | Discharge: 2018-02-26 | Disposition: A | Discharge status: Home / Self Care | Payer: Medicare Other | Source: Ambulatory Visit | Attending: Internal Medicine | Admitting: Internal Medicine

## 2018-02-26 ENCOUNTER — Ambulatory Visit: Payer: Medicare Other | Admitting: Nurse Practitioner

## 2018-02-26 DIAGNOSIS — R748 Abnormal levels of other serum enzymes: Secondary | ICD-10-CM | POA: Insufficient documentation

## 2018-02-26 LAB — TROPONIN I: Troponin I: 0.03 ng/mL (ref <0.03)

## 2018-03-10 NOTE — Progress Notes (Signed)
[place]  [phone]) [phone] Fax:(336) [phone]  ID: Regina Burke OB: May 19, 1933  MR#: [id number]  PXT#:[id number]  Patient Care Team: Tracie Harrier, MD as PCP - General (Internal Medicine) Minna Merritts, MD as Consulting Physician (Cardiology) Alisa Graff, FNP as Nurse Practitioner (Family Medicine) Laverle Hobby, MD as Consulting Physician (Pulmonary Disease) Lavonia Dana, MD as Consulting Physician (Internal Medicine)  CHIEF COMPLAINT: Anemia secondary to chronic renal failure, clinical stage IA adenocarcinoma of right upper lobe lung.  INTERVAL HISTORY: Patient returns to clinic today for routine six-month follow-up, repeat laboratory work, and consideration of additional Procrit.  She continues to have multiple medical complaints that are chronic and unchanged.  She continues to have chronic weakness and fatigue.  She is no longer enrolled in hospice.  She continues to have shortness of breath and requires oxygen 24 hours/day. She has no new neurologic complaints. She denies any recent fevers.  She denies any chest pain or cough.  She has no nausea, vomiting, constipation, or diarrhea. She has no urinary complaints.  Patient offers no further specific complaints today.  REVIEW OF SYSTEMS:   Review of Systems  Constitutional: Positive for malaise/fatigue. Negative for fever and weight loss.  Respiratory: Positive for shortness of breath. Negative for cough and hemoptysis.   Cardiovascular: Positive for leg swelling. Negative for chest pain.  Gastrointestinal: Negative.  Negative for abdominal pain, blood in stool, diarrhea and melena.  Genitourinary: Negative.  Negative for hematuria.  Musculoskeletal: Positive for back pain.  Skin: Negative.  Negative for rash.  Neurological: Positive for dizziness and weakness. Negative for focal weakness.  Psychiatric/Behavioral: Negative.  The patient is not nervous/anxious.     As per HPI.  Otherwise, a complete review of systems is negative.  PAST MEDICAL HISTORY: Past Medical History:  Diagnosis Date  . Anemia of chronic disease   . Arthritis   . Asthma   . Cardiac resynchronization therapy pacemaker (CRT-P) in place    a. 03/31/16:  Medtronic Percepta Quad CRT-P MRI SureScan (serial Number [id number]) device.  . Chronic diastolic CHF (congestive heart failure) ([place])    a. 10/2015 Echo: EF 55-65%, Gr1 DD, mild MR, mildly dil LA, nl RV fxn, nl PASP;  b. 03/2016 Echo: EF 30-35%, mod MR, mod dil LA; c. 12/2016 Echo: EF 60-65%, no rwma, mild AS, mildly dil LA, PASP 19mmHg.  . CKD (chronic kidney disease), stage IV ([place])   . COPD (chronic obstructive pulmonary disease) ([place])   . Coronary artery disease    a. 11/2014 NSTEMI/PCI: LM nl, LAD 25m, D1 30, LCX mild dzs, OM1 20p, OM2 87m, OM3 90p (2.25x8 Promus Premier DES), RCA nl.   . Cough    CHRONIC AT NIGHT  . Cushing's disease ([place])   . Depression   . Diverticulitis   . Edema    FEET/LEGS  . GERD (gastroesophageal reflux disease)   . Gout   . History of hiatal hernia   . History of pneumonia   . HLD (hyperlipidemia)   . HOH (hard of hearing)   . Hypertension   . Hypertensive heart disease   . Lung cancer ([place]) dx'd 2014   S/P radiation 2015  . Migraine   . Mixed Ischemic and Nonischemic Cardiomyopathy ([place])    a. 03/2016 Echo: EF 30-35%;  b. 12/2016 Echo: EF 60-65%.  . Moderate mitral regurgitation   . Multiple allergies   . Myocardial infarction ([place])   . Oxygen deficiency    2LITERS  .  Persistent atrial fibrillation ([place])    a. CHADS2VASc ==> 7 (CHF, HTN, age x 2, DM, vascular disease, and gender)--was on renal dosed eliquis but this was d/c'd in 12/2016 in setting of dark stools/anemia;  c. 03/2016 s/p AVN and MDT BiV ICD placement.  . Presence of permanent cardiac pacemaker   . S/P AV nodal ablation    a. on 03/31/16 for persistant afib with CRT-P placement  . Sleep apnea   . Type II diabetes mellitus ([place])      PAST SURGICAL HISTORY: Past Surgical History:  Procedure Laterality Date  . ABDOMINAL HYSTERECTOMY    . ABLATION     July 2017  . ADRENALECTOMY Left 1980's   "[place]"  . APPENDECTOMY    . BREAST CYST EXCISION Left   . CATARACT EXTRACTION W/PHACO Right 12/28/2015   Procedure: CATARACT EXTRACTION PHACO AND INTRAOCULAR LENS PLACEMENT (IOC);  Surgeon: Birder Robson, MD;  Location: [facility] OR[facility];  Service: Ophthalmology;  Laterality: Right;  [place] 48.4   . CATARACT EXTRACTION W/PHACO Left 11/14/2016   Procedure: CATARACT EXTRACTION PHACO AND INTRAOCULAR LENS PLACEMENT (IOC);  Surgeon: Birder Robson, MD;  Location: [facility];  Service: Ophthalmology;  Laterality: Left;  [place] 59.8 AP% 18.1 CDE 10.78 Fluid pack lot # [id number]  . CHOLECYSTECTOMY    . CORONARY ANGIOPLASTY WITH STENT PLACEMENT  12/23/2014  . ELECTROPHYSIOLOGIC STUDY N/A 03/08/2016   Procedure: CARDIOVERSION;  Surgeon: Wende Bushy, MD;  Location: [facility];  Service: Cardiovascular;  Laterality: N/A;  . ELECTROPHYSIOLOGIC STUDY N/A 03/07/2016   Procedure: Cardioversion;  Surgeon: Wende Bushy, MD;  Location: [facility];  Service: Cardiovascular;  Laterality: N/A;  . ELECTROPHYSIOLOGIC STUDY N/A 03/31/2016   Procedure: AV Node Ablation;  Surgeon: Will Meredith Leeds, MD;  Location: [facility];  Service: Cardiovascular;  Laterality: N/A;  . EP IMPLANTABLE DEVICE N/A 03/31/2016   Procedure: BiV Pacemaker Insertion CRT-P;  Surgeon: Will Meredith Leeds, MD;  Location: [facility];  Service: Cardiovascular;  Laterality: N/A;  . ESOPHAGOGASTRODUODENOSCOPY (EGD) WITH PROPOFOL N/A 01/26/2017   Procedure: ESOPHAGOGASTRODUODENOSCOPY (EGD) WITH PROPOFOL;  Surgeon: [name], MD;  Location: [facility];  Service: Endoscopy;  Laterality: N/A;  . FRACTURE SURGERY    . HIP PINNING,CANNULATED Left 02/01/2017   Procedure: CANNULATED HIP PINNING;  Surgeon: Lovell Sheehan, MD;  Location: [facility];  Service: Orthopedics;  Laterality: Left;   . INSERT / REPLACE / REMOVE PACEMAKER  02/2016  . LEFT HEART CATHETERIZATION WITH CORONARY ANGIOGRAM N/A 12/23/2014   Procedure: LEFT HEART CATHETERIZATION WITH CORONARY ANGIOGRAM;  Surgeon: Burnell Blanks, MD;  Location: [facility] C[facility] LAB;  Service: Cardiovascular;  Laterality: N/A;  . PERCUTANEOUS CORONARY STENT INTERVENTION (PCI-S)  12/23/2014   Procedure: PERCUTANEOUS CORONARY STENT INTERVENTION (PCI-S);  Surgeon: Burnell Blanks, MD;  Location: [facility];  Service: Cardiovascular;;  Promus 2.25x8  . TONSILLECTOMY    . TRANSTHORACIC ECHOCARDIOGRAM  11/26/2015   Technically difficult study. EF 55-60%. Normal wall motion. GR 1 DD.  . TUBAL LIGATION    . WRIST FRACTURE SURGERY Bilateral ~ 2000    FAMILY HISTORY: Family History  Problem Relation Age of Onset  . Heart disease Mother   . Diabetes Mother   . Osteoarthritis Mother   . Hypertension Mother   . Heart disease Father   . Hypertension Father   . COPD Brother     ADVANCED DIRECTIVES (Y/N):  N  HEALTH MAINTENANCE: Social History   Tobacco Use  . Smoking status: Former Smoker  Packs/day: 1.00    Years: 45.00    Pack years: 45.00    Types: Cigarettes    Last attempt to quit: 04/25/1994    Years since quitting: 23.9  . Smokeless tobacco: Never Used  Substance Use Topics  . Alcohol use: No    Comment: 12/23/2014 "might have a couple mixed drinks/year"  . Drug use: No     Colonoscopy:  PAP:  Bone density:  Lipid panel:  Allergies  Allergen Reactions  . Ciprofloxacin Shortness Of Breath, Itching and Rash  . Doxycycline Shortness Of Breath, Itching and Rash  . Penicillins Shortness Of Breath, Itching, Rash and Other (See Comments)    Has patient had a PCN reaction causing immediate rash, facial/tongue/throat swelling, SOB or lightheadedness with hypotension: Yes Has patient had a PCN reaction causing severe rash involving mucus membranes or skin necrosis: No Has patient had a PCN reaction that required  hospitalization No Has patient had a PCN reaction occurring within the last 10 years: No If all of the above answers are "NO", then may proceed with Cephalosporin use.  . Sulfa Antibiotics Shortness Of Breath, Itching and Rash  . Latex Itching  . Morphine And Related Itching  . Albuterol Sulfate   . Macrobid [Nitrofurantoin Macrocrystal] Itching  . Cefuroxime Rash    Blisters in mouth    Current Outpatient Medications  Medication Sig Dispense Refill  . ALPRAZolam (XANAX) 0.5 MG tablet Take 0.5 mg by mouth at bedtime.    Marland Kitchen atorvastatin (LIPITOR) 20 MG tablet Take 20 mg by mouth daily.    . feeding supplement, ENSURE ENLIVE, (ENSURE ENLIVE) LIQD Take 237 mLs by mouth 3 (three) times daily with meals. 237 mL 12  . fenofibrate (TRICOR) 48 MG tablet Take 48 mg by mouth every evening.     . fluticasone furoate-vilanterol (BREO ELLIPTA) 100-25 MCG/INH AEPB Inhale 1 puff into the lungs daily as needed (shortness of breath).     Marland Kitchen levalbuterol (XOPENEX) 1.25 MG/0.5ML nebulizer solution Take 1.25 mg by nebulization every 6 (six) hours as needed for wheezing or shortness of breath. 1 each 12  . metoprolol succinate (TOPROL XL) 25 MG 24 hr tablet Take 25 mg by mouth daily.     . nitroGLYCERIN (NITROSTAT) 0.4 MG SL tablet Place 1 tablet (0.4 mg total) under the tongue every 5 (five) minutes as needed for chest pain. 25 tablet 3  . omeprazole (PRILOSEC) 20 MG capsule Take 20 mg by mouth daily.  5  . ondansetron (ZOFRAN) 4 MG tablet Take 4 mg by mouth every 8 (eight) hours as needed. for nausea  0  . sertraline (ZOLOFT) 50 MG tablet Take 1 tablet (50 mg total) by mouth daily. 30 tablet 2  . tiotropium (SPIRIVA) 18 MCG inhalation capsule Place 18 mcg into inhaler and inhale daily as needed (shortness of breath).     . torsemide (DEMADEX) 20 MG tablet Take 3 tablets (60 mg total) by mouth daily. 30 tablet 0  . traMADol (ULTRAM) 50 MG tablet Take 50 mg by mouth 2 (two) times daily as needed. for pain  3  .  morphine 10 MG/5ML solution Take 5 mg by mouth daily as needed for severe pain.     No current facility-administered medications for this visit.     OBJECTIVE: Vitals:   03/12/18 0913 03/12/18 0939  BP: (!) 155/76   Pulse: 70   Resp: 18   Temp: (!) 97.5 F (36.4 C)   SpO2:  (!) 71%  There is no height or weight on file to calculate BMI.    ECOG FS:2 - Symptomatic, <50% confined to bed  General: Well-developed, well-nourished, no acute distress.  Sitting in a wheelchair. Eyes: Pink conjunctiva, anicteric sclera. HEENT: Normocephalic, moist mucous membranes. Lungs: Clear to auscultation bilaterally. Heart: Regular rate and rhythm. No rubs, murmurs, or gallops. Abdomen: Soft, nontender, nondistended. No organomegaly noted, normoactive bowel sounds. Musculoskeletal: No edema, cyanosis, or clubbing. Neuro: Alert, answering all questions appropriately. Cranial nerves grossly intact. Skin: No rashes or petechiae noted. Psych: Normal affect.  LAB RESULTS:  Lab Results  Component Value Date   NA 137 01/21/2018   K 4.4 01/21/2018   CL 93 (L) 01/21/2018   CO2 32 01/21/2018   GLUCOSE 146 (H) 01/21/2018   BUN 130 (H) 01/21/2018   CREATININE 1.98 (H) 01/21/2018   CALCIUM 8.3 (L) 01/21/2018   PROT 7.4 01/15/2018   ALBUMIN 3.1 (L) 01/19/2018   AST 33 01/15/2018   ALT 14 01/15/2018   ALKPHOS 45 01/15/2018   BILITOT 1.8 (H) 01/15/2018   GFRNONAA 22 (L) 01/21/2018   GFRAA 26 (L) 01/21/2018    Lab Results  Component Value Date   WBC 6.0 03/12/2018   NEUTROABS 5.0 03/12/2018   HGB 9.4 (L) 03/12/2018   HCT 28.1 (L) 03/12/2018   MCV 83.1 03/12/2018   PLT 207 03/12/2018   Lab Results  Component Value Date   IRON 47 01/20/2018   TIBC 322 01/20/2018   IRONPCTSAT 15 01/20/2018   Lab Results  Component Value Date   FERRITIN 156 01/20/2018     STUDIES: No results found.  ASSESSMENT: Anemia secondary to chronic renal failure, clinical stage IA adenocarcinoma of right  upper lobe lung.  PLAN:    1. Anemia secondary to chronic renal failure: Patient's hemoglobin continues to be below 10.0, therefore we will proceed with 40,000 units Procrit today.  Her most recent iron stores on Jan 20, 2018 were reported within normal limits.  Now the patient is off hospice, she requests routine follow-up.  Return to clinic every 4 weeks for laboratory work and Procrit and then in 4 months for further evaluation.   2. Clinical stage IA adenocarcinoma of right upper lobe lung: Patient is status post stereotactic radiation therapy. Repeat CT scan on Jan 24, 2017 did not reveal any recurrence or progressive disease. Given patient's multiple comorbidities and declining performance status, no further treatment is recommended.  No further imaging is necessary unless there is concern of progressive disease. 3. Chronic renal failure: Patient's creatinine appears to be approximately her baseline.  Continue follow-up with nephrology as indicated.  Patient expressed understanding and was in agreement with this plan. She also understands that She can call clinic at any time with any questions, concerns, or complaints.   Cancer Staging Cancer of upper lobe of right lung [facility]) Staging form: Lung, AJCC 7th Edition - Clinical stage from 10/08/2016: Stage IA (T1a, N0, M0) - Signed by Lloyd Huger, MD on 10/08/2016   Lloyd Huger, MD   03/15/2018 3:16 PM

## 2018-03-12 ENCOUNTER — Inpatient Hospital Stay: Payer: Medicare Other | Attending: Oncology

## 2018-03-12 ENCOUNTER — Inpatient Hospital Stay (HOSPITAL_BASED_OUTPATIENT_CLINIC_OR_DEPARTMENT_OTHER): Payer: Medicare Other | Admitting: Oncology

## 2018-03-12 ENCOUNTER — Inpatient Hospital Stay: Payer: Medicare Other

## 2018-03-12 ENCOUNTER — Other Ambulatory Visit: Payer: Self-pay

## 2018-03-12 ENCOUNTER — Encounter: Payer: Self-pay | Admitting: Oncology

## 2018-03-12 VITALS — BP 155/76 | HR 70 | Temp 97.5°F | Resp 18

## 2018-03-12 DIAGNOSIS — N189 Chronic kidney disease, unspecified: Principal | ICD-10-CM

## 2018-03-12 DIAGNOSIS — D631 Anemia in chronic kidney disease: Principal | ICD-10-CM

## 2018-03-12 DIAGNOSIS — Z85118 Personal history of other malignant neoplasm of bronchus and lung: Secondary | ICD-10-CM | POA: Diagnosis not present

## 2018-03-12 DIAGNOSIS — N184 Chronic kidney disease, stage 4 (severe): Secondary | ICD-10-CM | POA: Diagnosis present

## 2018-03-12 DIAGNOSIS — C3411 Malignant neoplasm of upper lobe, right bronchus or lung: Secondary | ICD-10-CM

## 2018-03-12 LAB — CBC WITH DIFFERENTIAL/PLATELET
BASOS ABS: 0.1 10*3/uL (ref 0–0.1)
Basophils Relative: 1 %
EOS PCT: 1 %
Eosinophils Absolute: 0.1 10*3/uL (ref 0–0.7)
HCT: 28.1 % — ABNORMAL LOW (ref 35.0–47.0)
HEMOGLOBIN: 9.4 g/dL — AB (ref 12.0–16.0)
LYMPHS ABS: 0.6 10*3/uL — AB (ref 1.0–3.6)
LYMPHS PCT: 11 %
MCH: 27.8 pg (ref 26.0–34.0)
MCHC: 33.5 g/dL (ref 32.0–36.0)
MCV: 83.1 fL (ref 80.0–100.0)
Monocytes Absolute: 0.2 10*3/uL (ref 0.2–0.9)
Monocytes Relative: 4 %
NEUTROS ABS: 5 10*3/uL (ref 1.4–6.5)
NEUTROS PCT: 83 %
PLATELETS: 207 10*3/uL (ref 150–440)
RBC: 3.39 MIL/uL — AB (ref 3.80–5.20)
RDW: 17.7 % — ABNORMAL HIGH (ref 11.5–14.5)
WBC: 6 10*3/uL (ref 3.6–11.0)

## 2018-03-12 MED ORDER — EPOETIN ALFA 40000 UNIT/ML IJ SOLN
40000.0000 [IU] | Freq: Once | INTRAMUSCULAR | Status: AC
  Administered 2018-03-12: 40000 [IU] via SUBCUTANEOUS
  Filled 2018-03-12: qty 1

## 2018-03-12 NOTE — Progress Notes (Signed)
Pt in for follow up, has been discharged from hospice.  Pt oxygen sats low @ 71 pt on portable oxygen at 2l/m, switched to cancer center tank and oxygen @ 3l/m sats increased to 96% after 15 minutes.

## 2018-03-21 ENCOUNTER — Emergency Department
Admission: EM | Admit: 2018-03-21 | Discharge: 2018-03-21 | Disposition: A | Discharge status: Home / Self Care | Payer: Medicare Other | Attending: Emergency Medicine | Admitting: Emergency Medicine

## 2018-03-21 ENCOUNTER — Encounter: Payer: Self-pay | Admitting: Emergency Medicine

## 2018-03-21 ENCOUNTER — Other Ambulatory Visit: Payer: Self-pay

## 2018-03-21 ENCOUNTER — Emergency Department: Payer: Medicare Other

## 2018-03-21 DIAGNOSIS — I5032 Chronic diastolic (congestive) heart failure: Secondary | ICD-10-CM | POA: Diagnosis not present

## 2018-03-21 DIAGNOSIS — Z87891 Personal history of nicotine dependence: Secondary | ICD-10-CM | POA: Insufficient documentation

## 2018-03-21 DIAGNOSIS — Z79899 Other long term (current) drug therapy: Secondary | ICD-10-CM | POA: Insufficient documentation

## 2018-03-21 DIAGNOSIS — J449 Chronic obstructive pulmonary disease, unspecified: Secondary | ICD-10-CM | POA: Diagnosis not present

## 2018-03-21 DIAGNOSIS — I2510 Atherosclerotic heart disease of native coronary artery without angina pectoris: Secondary | ICD-10-CM | POA: Diagnosis not present

## 2018-03-21 DIAGNOSIS — Z91040 Latex allergy status: Secondary | ICD-10-CM | POA: Insufficient documentation

## 2018-03-21 DIAGNOSIS — Z85118 Personal history of other malignant neoplasm of bronchus and lung: Secondary | ICD-10-CM | POA: Diagnosis not present

## 2018-03-21 DIAGNOSIS — R609 Edema, unspecified: Secondary | ICD-10-CM

## 2018-03-21 DIAGNOSIS — N184 Chronic kidney disease, stage 4 (severe): Secondary | ICD-10-CM | POA: Diagnosis not present

## 2018-03-21 DIAGNOSIS — R079 Chest pain, unspecified: Secondary | ICD-10-CM

## 2018-03-21 DIAGNOSIS — I130 Hypertensive heart and chronic kidney disease with heart failure and stage 1 through stage 4 chronic kidney disease, or unspecified chronic kidney disease: Secondary | ICD-10-CM | POA: Insufficient documentation

## 2018-03-21 DIAGNOSIS — E1122 Type 2 diabetes mellitus with diabetic chronic kidney disease: Secondary | ICD-10-CM | POA: Diagnosis not present

## 2018-03-21 LAB — CBC
HCT: 27.1 % — ABNORMAL LOW (ref 35.0–47.0)
HEMOGLOBIN: 9 g/dL — AB (ref 12.0–16.0)
MCH: 27.6 pg (ref 26.0–34.0)
MCHC: 33.2 g/dL (ref 32.0–36.0)
MCV: 83.3 fL (ref 80.0–100.0)
PLATELETS: 190 10*3/uL (ref 150–440)
RBC: 3.25 MIL/uL — AB (ref 3.80–5.20)
RDW: 18.3 % — ABNORMAL HIGH (ref 11.5–14.5)
WBC: 5.4 10*3/uL (ref 3.6–11.0)

## 2018-03-21 LAB — BASIC METABOLIC PANEL
ANION GAP: 15 (ref 5–15)
BUN: 40 mg/dL — ABNORMAL HIGH (ref 8–23)
CALCIUM: 8.3 mg/dL — AB (ref 8.9–10.3)
CO2: 40 mmol/L — AB (ref 22–32)
CREATININE: 2.53 mg/dL — AB (ref 0.44–1.00)
Chloride: 87 mmol/L — ABNORMAL LOW (ref 98–111)
GFR calc non Af Amer: 17 mL/min — ABNORMAL LOW (ref >60)
GFR, EST AFRICAN AMERICAN: 19 mL/min — AB (ref >60)
Glucose, Bld: 121 mg/dL — ABNORMAL HIGH (ref 70–99)
Potassium: 3.5 mmol/L (ref 3.5–5.1)
SODIUM: 142 mmol/L (ref 135–145)

## 2018-03-21 LAB — TROPONIN I: TROPONIN I: 0.07 ng/mL — AB (ref <0.03)

## 2018-03-21 LAB — BRAIN NATRIURETIC PEPTIDE: B Natriuretic Peptide: 984 pg/mL — ABNORMAL HIGH (ref 0.0–100.0)

## 2018-03-21 NOTE — ED Notes (Signed)
Patient transported to X-ray 

## 2018-03-21 NOTE — Discharge Instructions (Addendum)
As we discussed please follow-up with your doctor in the next several days for recheck/reevaluation.  Return to the emergency department for any further chest pain or trouble breathing, or any other symptom personally concerning to yourself.

## 2018-03-21 NOTE — ED Notes (Signed)
Date and time results received: 03/21/18 1011  Test: troponin I Critical Value: 0.07  Name of Provider Notified: Dr. Kerman Passey  Orders Received? Or Actions Taken?: no new orders at this time

## 2018-03-21 NOTE — ED Provider Notes (Signed)
[facility] Emergency Department Provider Note  Time seen: 9:33 AM  I have reviewed the triage vital signs and the nursing notes.   HISTORY  Chief Complaint Chest Pain    HPI Regina Burke is a 84 y.o. female with a past medical history of anemia, pacemaker, CHF, CKD, COPD CAD with stent, hypertension, hyperlipidemia, presents to the emergency department for chest pain.  According to the patient around 4:00 this morning she awoke with chest discomfort states mild shortness of breath at that time as well denies any nausea or diaphoresis.  States mild swelling in her legs over the past several days.  Denies any abdominal pain, nausea, vomiting, diarrhea, dysuria.  No fever.  States the chest pain is largely resolved very slight discomfort currently.   Past Medical History:  Diagnosis Date  . Anemia of chronic disease   . Arthritis   . Asthma   . Cardiac resynchronization therapy pacemaker (CRT-P) in place    a. 03/31/16:  Medtronic Percepta Quad CRT-P MRI SureScan (serial Number [id number]) device.  . Chronic diastolic CHF (congestive heart failure) ([place])    a. 10/2015 Echo: EF 55-65%, Gr1 DD, mild MR, mildly dil LA, nl RV fxn, nl PASP;  b. 03/2016 Echo: EF 30-35%, mod MR, mod dil LA; c. 12/2016 Echo: EF 60-65%, no rwma, mild AS, mildly dil LA, PASP 14mmHg.  . CKD (chronic kidney disease), stage IV ([place])   . COPD (chronic obstructive pulmonary disease) ([place])   . Coronary artery disease    a. 11/2014 NSTEMI/PCI: LM nl, LAD 32m, D1 30, LCX mild dzs, OM1 20p, OM2 66m, OM3 90p (2.25x8 Promus Premier DES), RCA nl.   . Cough    CHRONIC AT NIGHT  . Cushing's disease ([place])   . Depression   . Diverticulitis   . Edema    FEET/LEGS  . GERD (gastroesophageal reflux disease)   . Gout   . History of hiatal hernia   . History of pneumonia   . HLD (hyperlipidemia)   . HOH (hard of hearing)   . Hypertension   . Hypertensive heart disease   . Lung cancer ([place]) dx'd 2014    S/P radiation 2015  . Migraine   . Mixed Ischemic and Nonischemic Cardiomyopathy ([place])    a. 03/2016 Echo: EF 30-35%;  b. 12/2016 Echo: EF 60-65%.  . Moderate mitral regurgitation   . Multiple allergies   . Myocardial infarction ([place])   . Oxygen deficiency    2LITERS  . Persistent atrial fibrillation ([place])    a. CHADS2VASc ==> 7 (CHF, HTN, age x 2, DM, vascular disease, and gender)--was on renal dosed eliquis but this was d/c'd in 12/2016 in setting of dark stools/anemia;  c. 03/2016 s/p AVN and MDT BiV ICD placement.  . Presence of permanent cardiac pacemaker   . S/P AV nodal ablation    a. on 03/31/16 for persistant afib with CRT-P placement  . Sleep apnea   . Type II diabetes mellitus [facility])     Patient Active Problem List   Diagnosis Date Noted  . Acute on chronic heart failure ([place])   . Goals of care, counseling/discussion   . Advance care planning   . S/p left hip fracture 01/31/2017  . Pre-operative cardiovascular examination 01/31/2017  . Other diseases of stomach and duodenum   . Dizziness 08/25/2016  . LBBB (left bundle branch block) 04/01/2016  . S/P AV nodal ablation   . Cardiac resynchronization therapy pacemaker (CRT-P) in place   .  Persistent atrial fibrillation ([place]): CHA2DS2-Vasc = ~7. On Xarelto 15 mg (age & renal Fxn) 03/18/2016  . Congestive dilated cardiomyopathy ([place]) 03/18/2016    Class: Temporary  . DNR (do not resuscitate) discussion   . Palliative care encounter   . Persistent atrial fibrillation ([place])   . Chronic diastolic CHF (congestive heart failure) ([place]) 02/16/2016  . HLD (hyperlipidemia) 02/16/2016  . Iron deficiency anemia due to chronic blood loss   . Essential hypertension   . Diaphoresis   . Renal insufficiency   . Chronic obstructive pulmonary disease ([place]) 07/14/2015  . Status post thoracentesis   . S/P thoracentesis   . H/O: lung cancer   . Pleural effusion   . Coronary artery disease involving native coronary artery of native heart  without angina pectoris   . S/P coronary artery stent placement   . Pressure ulcer 06/08/2015  . Dehydration 05/09/2015  . Diabetes ([place]) 05/09/2015  . Closed fracture nasal bone 05/09/2015  . Cancer of upper lobe of right lung ([place]) 01/08/2015  . Allergic state 01/08/2015  . Anemia associated with chronic renal failure 01/08/2015  . Chronic kidney disease 01/08/2015  . CAFL (chronic airflow limitation) ([place]) 01/08/2015  . Arthritis, degenerative 01/08/2015  . 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 Diabetes mellitus, type 2 ([place]) 09/11/2014  . Type 2 diabetes mellitus ([place]) 09/11/2014    Past Surgical History:  Procedure Laterality Date  . ABDOMINAL HYSTERECTOMY    . ABLATION     July 2017  . ADRENALECTOMY Left 1980's   "[place]"  . APPENDECTOMY    . BREAST CYST EXCISION Left   . CATARACT EXTRACTION W/PHACO Right 12/28/2015   Procedure: CATARACT EXTRACTION PHACO AND INTRAOCULAR LENS PLACEMENT (IOC);  Surgeon: Birder Robson, MD;  Location: [facility] ORS;  Service: Ophthalmology;  Laterality: Right;  [place] 48.4   . CATARACT EXTRACTION W/PHACO Left 11/14/2016   Procedure: CATARACT EXTRACTION PHACO AND INTRAOCULAR LENS PLACEMENT (IOC);  Surgeon: Birder Robson, MD;  Location: [facility] ORS;  Service: Ophthalmology;  Laterality: Left;  [place] 59.8 AP% 18.1 CDE 10.78 Fluid pack lot # [id number]  . CHOLECYSTECTOMY    . CORONARY ANGIOPLASTY WITH STENT PLACEMENT  12/23/2014  . ELECTROPHYSIOLOGIC STUDY N/A 03/08/2016   Procedure: CARDIOVERSION;  Surgeon: Wende Bushy, MD;  Location: [facility] ORS;  Service: Cardiovascular;  Laterality: N/A;  . ELECTROPHYSIOLOGIC STUDY N/A 03/07/2016   Procedure: Cardioversion;  Surgeon: Wende Bushy, MD;  Location: [facility] ORS;  Service: Cardiovascular;  Laterality: N/A;  . ELECTROPHYSIOLOGIC STUDY N/A 03/31/2016   Procedure: AV Node Ablation;  Surgeon: Will Meredith Leeds, MD;  Location: [place] [facility];  Service: Cardiovascular;  Laterality: N/A;  . EP IMPLANTABLE  DEVICE N/A 03/31/2016   Procedure: BiV Pacemaker Insertion CRT-P;  Surgeon: Will Meredith Leeds, MD;  Location: [place] [facility];  Service: Cardiovascular;  Laterality: N/A;  . ESOPHAGOGASTRODUODENOSCOPY (EGD) WITH PROPOFOL N/A 01/26/2017   Procedure: ESOPHAGOGASTRODUODENOSCOPY (EGD) WITH PROPOFOL;  Surgeon: [name], MD;  Location: [facility] ENDOSCOPY;  Service: Endoscopy;  Laterality: N/A;  . FRACTURE SURGERY    . HIP PINNING,CANNULATED Left 02/01/2017   Procedure: CANNULATED HIP PINNING;  Surgeon: Lovell Sheehan, MD;  Location: [facility] ORS;  Service: Orthopedics;  Laterality: Left;  . INSERT / REPLACE / REMOVE PACEMAKER  02/2016  . LEFT HEART CATHETERIZATION WITH CORONARY ANGIOGRAM N/A 12/23/2014   Procedure: LEFT HEART CATHETERIZATION WITH CORONARY ANGIOGRAM;  Surgeon: Burnell Blanks, MD;  Location: [facility] CATH LAB;  Service: Cardiovascular;  Laterality: N/A;  . PERCUTANEOUS CORONARY STENT INTERVENTION (  PCI-S)  12/23/2014   Procedure: PERCUTANEOUS CORONARY STENT INTERVENTION (PCI-S);  Surgeon: Burnell Blanks, MD;  Location: [facility] CATH LAB;  Service: Cardiovascular;;  Promus 2.25x8  . TONSILLECTOMY    . TRANSTHORACIC ECHOCARDIOGRAM  11/26/2015   Technically difficult study. EF 55-60%. Normal wall motion. GR 1 DD.  . TUBAL LIGATION    . WRIST FRACTURE SURGERY Bilateral ~ 2000    Prior to Admission medications   Medication Sig Start Date End Date Taking? Authorizing Provider  ALPRAZolam Duanne Moron) 0.5 MG tablet Take 0.5 mg by mouth at bedtime. 12/27/16   [provider]  atorvastatin (LIPITOR) 20 MG tablet Take 20 mg by mouth daily.    [provider]  feeding supplement, ENSURE ENLIVE, (ENSURE ENLIVE) LIQD Take 237 mLs by mouth 3 (three) times daily with meals. 03/02/17   Fritzi Mandes, MD  fenofibrate (TRICOR) 48 MG tablet Take 48 mg by mouth every evening.     [provider]  fluticasone furoate-vilanterol (BREO ELLIPTA) 100-25 MCG/INH AEPB Inhale 1 puff into the lungs  daily as needed (shortness of breath).     [provider]  levalbuterol (XOPENEX) 1.25 MG/0.5ML nebulizer solution Take 1.25 mg by nebulization every 6 (six) hours as needed for wheezing or shortness of breath. 03/02/17   Fritzi Mandes, MD  metoprolol succinate (TOPROL XL) 25 MG 24 hr tablet Take 25 mg by mouth daily.  12/19/16   Minna Merritts, MD  morphine 10 MG/5ML solution Take 5 mg by mouth daily as needed for severe pain.    [provider]  nitroGLYCERIN (NITROSTAT) 0.4 MG SL tablet Place 1 tablet (0.4 mg total) under the tongue every 5 (five) minutes as needed for chest pain. 12/24/14   Barrett, Evelene Croon, PA-C  omeprazole (PRILOSEC) 20 MG capsule Take 20 mg by mouth daily. 02/26/18   [provider]  ondansetron (ZOFRAN) 4 MG tablet Take 4 mg by mouth every 8 (eight) hours as needed. for nausea 02/26/18   [provider]  sertraline (ZOLOFT) 50 MG tablet Take 1 tablet (50 mg total) by mouth daily. 03/24/16   Gladstone Lighter, MD  tiotropium (SPIRIVA) 18 MCG inhalation capsule Place 18 mcg into inhaler and inhale daily as needed (shortness of breath).     [provider]  torsemide (DEMADEX) 20 MG tablet Take 3 tablets (60 mg total) by mouth daily. 01/22/18   Fritzi Mandes, MD  traMADol (ULTRAM) 50 MG tablet Take 50 mg by mouth 2 (two) times daily as needed. for pain 03/06/18   [provider]    Allergies  Allergen Reactions  . Ciprofloxacin Shortness Of Breath, Itching and Rash  . Doxycycline Shortness Of Breath, Itching and Rash  . Penicillins Shortness Of Breath, Itching, Rash and Other (See Comments)    Has patient had a PCN reaction causing immediate rash, facial/tongue/throat swelling, SOB or lightheadedness with hypotension: Yes Has patient had a PCN reaction causing severe rash involving mucus membranes or skin necrosis: No Has patient had a PCN reaction that required hospitalization No Has patient had a PCN reaction occurring within  the last 10 years: No If all of the above answers are "NO", then may proceed with Cephalosporin use.  . Sulfa Antibiotics Shortness Of Breath, Itching and Rash  . Latex Itching  . Morphine And Related Itching  . Albuterol Sulfate   . Macrobid [Nitrofurantoin Macrocrystal] Itching  . Cefuroxime Rash    Blisters in mouth    Family History  Problem Relation Age  of Onset  . Heart disease Mother   . Diabetes Mother   . Osteoarthritis Mother   . Hypertension Mother   . Heart disease Father   . Hypertension Father   . COPD Brother     Social History Social History   Tobacco Use  . Smoking status: Former Smoker    Packs/day: 1.00    Years: 45.00    Pack years: 45.00    Types: Cigarettes    Last attempt to quit: 04/25/1994    Years since quitting: 23.9  . Smokeless tobacco: Never Used  Substance Use Topics  . Alcohol use: No    Comment: 12/23/2014 "might have a couple mixed drinks/year"  . Drug use: No    Review of Systems Constitutional: Negative for fever. Cardiovascular: Chest pain at 4:00 this morning, largely resolved at this time but remains mildly present. Respiratory: States shortness of breath earlier this morning, now resolved Gastrointestinal: Negative for abdominal pain, vomiting Genitourinary: Negative for urinary compaints Musculoskeletal: States mild lower extremity swelling for the past several days. Skin: Negative for skin complaints  Neurological: Negative for headache All other ROS negative  ____________________________________________   PHYSICAL EXAM:  VITAL SIGNS: ED Triage Vitals [03/21/18 0915]  Enc Vitals Group     BP (!) 129/58     Pulse Rate 78     Resp 15     Temp 98.5 F (36.9 C)     Temp Source Oral     SpO2 97 %     Weight 187 lb (84.8 kg)     Height 5\' 4"  (1.626 m)     Head Circumference      Peak Flow      Pain Score 0     Pain Loc      Pain Edu?      Excl. in [place]?    Constitutional: Alert and oriented. Well appearing and  in no distress. Eyes: Normal exam ENT   Head: Normocephalic and atraumatic.   Mouth/Throat: Mucous membranes are moist. Cardiovascular: Normal rate, regular rhythm.  Respiratory: Normal respiratory effort without tachypnea nor retractions. Breath sounds are clear Gastrointestinal: Soft and nontender. No distention.   Musculoskeletal: Nontender with normal range of motion in all extremities.  Mild lower extremity edema, equal bilaterally.   Neurologic:  Normal speech and language. No gross focal neurologic deficits Skin:  Skin is warm, dry and intact.  Psychiatric: Mood and affect are normal.   ____________________________________________    EKG  EKG reviewed and interpreted by myself shows atrial flutter rate 81 bpm, paced rhythm, widened QRS, left axis deviation, slight QTC prolongation, nonspecific ST changes.  ____________________________________________    RADIOLOGY  Interstitial edema  ____________________________________________   INITIAL IMPRESSION / ASSESSMENT AND PLAN / ED COURSE  Pertinent labs & imaging results that were available during my care of the patient were reviewed by me and considered in my medical decision making (see chart for details).  Patient presents to the emergency department for chest pain since 4:00 this morning, largely resolved but still very slightly present per patient.  States shortness of breath this morning which has resolved.  No nausea or diaphoresis.  Overall the patient appears well currently, overall normal physical exam with very slight lower extremity edema which is equal bilaterally.  We will check labs, chest x-ray and continue to closely monitor.  Patient has a paced rhythm.  Patient's work-up shows interstitial edema this could explain some shortness of breath and the increased swelling in the legs  over the past several days.  States she saw her doctor yesterday who increased her Lasix from 40 mg daily to 60 mg.  As this was  just increased yesterday we will hold off on any additional changes.  Patient's troponin is slightly elevated 0.07, in reviewing the patient's records she typically has a slightly elevated troponin at baseline.  However given the elevated troponin the interstitial edema and her complaints of chest pain earlier this morning I did offer admission to the hospital, patient states she wishes to go home does not want to be admitted.  Patient has very good outpatient follow-up with her doctor as well as her kidney doctor and cardiologist.  As the patient's Lasix was increased yesterday we will leave it at its current increased dose and have the patient follow-up with her doctor in the next 2 to 3 days for recheck.  I did discuss with the patient if she has any further chest pain or develops any trouble breathing she is to return to the emergency department.  Patient agreeable with this plan. ____________________________________________   FINAL CLINICAL IMPRESSION(S) / ED DIAGNOSES  Chest pain Interstitial edema   Harvest Dark, MD 03/21/18 1129

## 2018-03-21 NOTE — ED Triage Notes (Signed)
Pt presents to ED via [facility] from home c/o chest pain this morning lasting about 45 min. No pain on arrival. PMH MI, CHF, pacemaker, COPD, stage 4 CKD, DM. Pt states she took 4 81mg  ASA when pain started. States she has noticed increased swelling to BLE and her nephrologist recently increased her lasix from 40mg  to 60mg .

## 2018-04-10 ENCOUNTER — Inpatient Hospital Stay: Payer: Medicare Other | Attending: Oncology

## 2018-04-10 ENCOUNTER — Inpatient Hospital Stay: Payer: Medicare Other

## 2018-04-10 DIAGNOSIS — N189 Chronic kidney disease, unspecified: Secondary | ICD-10-CM | POA: Diagnosis present

## 2018-04-10 DIAGNOSIS — D631 Anemia in chronic kidney disease: Secondary | ICD-10-CM | POA: Diagnosis present

## 2018-04-10 LAB — HEMOGLOBIN: Hemoglobin: 11.6 g/dL — ABNORMAL LOW (ref 12.0–16.0)

## 2018-04-11 ENCOUNTER — Ambulatory Visit: Payer: Medicare Other | Admitting: Nurse Practitioner

## 2018-04-30 ENCOUNTER — Ambulatory Visit (INDEPENDENT_AMBULATORY_CARE_PROVIDER_SITE_OTHER): Payer: Medicare Other | Admitting: *Deleted

## 2018-04-30 DIAGNOSIS — I4819 Other persistent atrial fibrillation: Secondary | ICD-10-CM

## 2018-04-30 DIAGNOSIS — I5032 Chronic diastolic (congestive) heart failure: Secondary | ICD-10-CM

## 2018-04-30 NOTE — Progress Notes (Signed)
Remote pacemaker transmission.   

## 2018-05-02 ENCOUNTER — Ambulatory Visit: Payer: Medicare Other | Admitting: Nurse Practitioner

## 2018-05-07 ENCOUNTER — Inpatient Hospital Stay: Payer: Medicare Other | Attending: Oncology

## 2018-05-07 ENCOUNTER — Other Ambulatory Visit: Payer: Self-pay

## 2018-05-07 ENCOUNTER — Inpatient Hospital Stay: Payer: Medicare Other

## 2018-05-07 DIAGNOSIS — N189 Chronic kidney disease, unspecified: Secondary | ICD-10-CM

## 2018-05-07 DIAGNOSIS — D631 Anemia in chronic kidney disease: Secondary | ICD-10-CM | POA: Insufficient documentation

## 2018-05-07 DIAGNOSIS — N184 Chronic kidney disease, stage 4 (severe): Secondary | ICD-10-CM | POA: Insufficient documentation

## 2018-05-07 LAB — HEMOGLOBIN: HEMOGLOBIN: 10.9 g/dL — AB (ref 12.0–16.0)

## 2018-05-22 ENCOUNTER — Other Ambulatory Visit: Payer: Self-pay | Admitting: Family Medicine

## 2018-05-22 DIAGNOSIS — R9389 Abnormal findings on diagnostic imaging of other specified body structures: Secondary | ICD-10-CM

## 2018-05-22 LAB — CUP PACEART REMOTE DEVICE CHECK
Battery Remaining Longevity: 67 mo
Brady Statistic AP VP Percent: 0 %
Brady Statistic AP VS Percent: 0 %
Brady Statistic AS VP Percent: 99.96 %
Brady Statistic AS VS Percent: 0.04 %
Brady Statistic RA Percent Paced: 0 %
Date Time Interrogation Session: 20190903043937
Implantable Lead Implant Date: 20170804
Implantable Lead Implant Date: 20170804
Implantable Lead Location: 753860
Implantable Lead Model: 4298
Implantable Pulse Generator Implant Date: 20170804
Lead Channel Impedance Value: 1311 Ohm
Lead Channel Impedance Value: 1330 Ohm
Lead Channel Impedance Value: 3344 Ohm
Lead Channel Impedance Value: 3344 Ohm
Lead Channel Impedance Value: 570 Ohm
Lead Channel Impedance Value: 570 Ohm
Lead Channel Impedance Value: 589 Ohm
Lead Channel Impedance Value: 779 Ohm
Lead Channel Impedance Value: 779 Ohm
Lead Channel Impedance Value: 874 Ohm
Lead Channel Pacing Threshold Amplitude: 0.5 V
Lead Channel Pacing Threshold Amplitude: 2.5 V
Lead Channel Pacing Threshold Pulse Width: 0.4 ms
Lead Channel Pacing Threshold Pulse Width: 1 ms
Lead Channel Setting Pacing Amplitude: 2.5 V
Lead Channel Setting Pacing Amplitude: 2.5 V
Lead Channel Setting Pacing Pulse Width: 0.4 ms
Lead Channel Setting Sensing Sensitivity: 4 mV
MDC IDC LEAD LOCATION: 753858
MDC IDC MSMT BATTERY VOLTAGE: 2.98 V
MDC IDC MSMT LEADCHNL LV IMPEDANCE VALUE: 1102 Ohm
MDC IDC MSMT LEADCHNL LV IMPEDANCE VALUE: 361 Ohm
MDC IDC MSMT LEADCHNL LV IMPEDANCE VALUE: 608 Ohm
MDC IDC MSMT LEADCHNL RV IMPEDANCE VALUE: 475 Ohm
MDC IDC MSMT LEADCHNL RV SENSING INTR AMPL: 18.125 mV
MDC IDC SET LEADCHNL LV PACING PULSEWIDTH: 1.5 ms
MDC IDC STAT BRADY RV PERCENT PACED: 99.96 %

## 2018-05-30 ENCOUNTER — Emergency Department: Payer: Medicare Other

## 2018-05-30 ENCOUNTER — Other Ambulatory Visit: Payer: Self-pay

## 2018-05-30 ENCOUNTER — Observation Stay
Admission: EM | Admit: 2018-05-30 | Discharge: 2018-06-01 | Disposition: A | Discharge status: Home / Self Care | Payer: Medicare Other | Attending: Internal Medicine | Admitting: Internal Medicine

## 2018-05-30 ENCOUNTER — Ambulatory Visit: Payer: Medicare Other

## 2018-05-30 DIAGNOSIS — J449 Chronic obstructive pulmonary disease, unspecified: Secondary | ICD-10-CM | POA: Insufficient documentation

## 2018-05-30 DIAGNOSIS — K219 Gastro-esophageal reflux disease without esophagitis: Secondary | ICD-10-CM | POA: Insufficient documentation

## 2018-05-30 DIAGNOSIS — Z87891 Personal history of nicotine dependence: Secondary | ICD-10-CM | POA: Diagnosis not present

## 2018-05-30 DIAGNOSIS — I130 Hypertensive heart and chronic kidney disease with heart failure and stage 1 through stage 4 chronic kidney disease, or unspecified chronic kidney disease: Secondary | ICD-10-CM | POA: Diagnosis not present

## 2018-05-30 DIAGNOSIS — Z79899 Other long term (current) drug therapy: Secondary | ICD-10-CM | POA: Diagnosis not present

## 2018-05-30 DIAGNOSIS — N184 Chronic kidney disease, stage 4 (severe): Secondary | ICD-10-CM | POA: Insufficient documentation

## 2018-05-30 DIAGNOSIS — G4730 Sleep apnea, unspecified: Secondary | ICD-10-CM | POA: Diagnosis not present

## 2018-05-30 DIAGNOSIS — Z950 Presence of cardiac pacemaker: Secondary | ICD-10-CM | POA: Insufficient documentation

## 2018-05-30 DIAGNOSIS — Z951 Presence of aortocoronary bypass graft: Secondary | ICD-10-CM | POA: Diagnosis not present

## 2018-05-30 DIAGNOSIS — E1122 Type 2 diabetes mellitus with diabetic chronic kidney disease: Secondary | ICD-10-CM | POA: Diagnosis not present

## 2018-05-30 DIAGNOSIS — E860 Dehydration: Secondary | ICD-10-CM

## 2018-05-30 DIAGNOSIS — R4182 Altered mental status, unspecified: Secondary | ICD-10-CM | POA: Diagnosis present

## 2018-05-30 DIAGNOSIS — G9340 Encephalopathy, unspecified: Secondary | ICD-10-CM | POA: Diagnosis not present

## 2018-05-30 DIAGNOSIS — N179 Acute kidney failure, unspecified: Secondary | ICD-10-CM | POA: Diagnosis not present

## 2018-05-30 DIAGNOSIS — J9611 Chronic respiratory failure with hypoxia: Secondary | ICD-10-CM | POA: Diagnosis not present

## 2018-05-30 DIAGNOSIS — I2510 Atherosclerotic heart disease of native coronary artery without angina pectoris: Secondary | ICD-10-CM | POA: Insufficient documentation

## 2018-05-30 DIAGNOSIS — Z66 Do not resuscitate: Secondary | ICD-10-CM | POA: Insufficient documentation

## 2018-05-30 DIAGNOSIS — I639 Cerebral infarction, unspecified: Secondary | ICD-10-CM

## 2018-05-30 DIAGNOSIS — F419 Anxiety disorder, unspecified: Secondary | ICD-10-CM | POA: Insufficient documentation

## 2018-05-30 DIAGNOSIS — Z955 Presence of coronary angioplasty implant and graft: Secondary | ICD-10-CM | POA: Diagnosis not present

## 2018-05-30 DIAGNOSIS — B029 Zoster without complications: Secondary | ICD-10-CM | POA: Insufficient documentation

## 2018-05-30 DIAGNOSIS — I5032 Chronic diastolic (congestive) heart failure: Secondary | ICD-10-CM | POA: Insufficient documentation

## 2018-05-30 DIAGNOSIS — Z85118 Personal history of other malignant neoplasm of bronchus and lung: Secondary | ICD-10-CM | POA: Insufficient documentation

## 2018-05-30 DIAGNOSIS — E785 Hyperlipidemia, unspecified: Secondary | ICD-10-CM | POA: Insufficient documentation

## 2018-05-30 DIAGNOSIS — I252 Old myocardial infarction: Secondary | ICD-10-CM | POA: Insufficient documentation

## 2018-05-30 DIAGNOSIS — F329 Major depressive disorder, single episode, unspecified: Secondary | ICD-10-CM | POA: Diagnosis not present

## 2018-05-30 DIAGNOSIS — R42 Dizziness and giddiness: Secondary | ICD-10-CM

## 2018-05-30 DIAGNOSIS — N189 Chronic kidney disease, unspecified: Secondary | ICD-10-CM

## 2018-05-30 DIAGNOSIS — Z923 Personal history of irradiation: Secondary | ICD-10-CM | POA: Diagnosis not present

## 2018-05-30 LAB — CBC
HEMATOCRIT: 28 % — AB (ref 35.0–47.0)
HEMOGLOBIN: 9.9 g/dL — AB (ref 12.0–16.0)
MCH: 28.1 pg (ref 26.0–34.0)
MCHC: 35.2 g/dL (ref 32.0–36.0)
MCV: 79.9 fL — ABNORMAL LOW (ref 80.0–100.0)
Platelets: 120 10*3/uL — ABNORMAL LOW (ref 150–440)
RBC: 3.5 MIL/uL — ABNORMAL LOW (ref 3.80–5.20)
RDW: 18.9 % — AB (ref 11.5–14.5)
WBC: 2.7 10*3/uL — ABNORMAL LOW (ref 3.6–11.0)

## 2018-05-30 LAB — URINALYSIS, COMPLETE (UACMP) WITH MICROSCOPIC
Bilirubin Urine: NEGATIVE
GLUCOSE, UA: NEGATIVE mg/dL
Hgb urine dipstick: NEGATIVE
KETONES UR: NEGATIVE mg/dL
Nitrite: NEGATIVE
PROTEIN: NEGATIVE mg/dL
Specific Gravity, Urine: 1.011 (ref 1.005–1.030)
Squamous Epithelial / LPF: NONE SEEN (ref 0–5)
pH: 6 (ref 5.0–8.0)

## 2018-05-30 LAB — BLOOD GAS, ARTERIAL
Acid-Base Excess: 8.4 mmol/L — ABNORMAL HIGH (ref 0.0–2.0)
Bicarbonate: 33.9 mmol/L — ABNORMAL HIGH (ref 20.0–28.0)
FIO2: 0.28
O2 Saturation: 93.3 %
PCO2 ART: 51 mmHg — AB (ref 32.0–48.0)
PO2 ART: 66 mmHg — AB (ref 83.0–108.0)
Patient temperature: 37
pH, Arterial: 7.43 (ref 7.350–7.450)

## 2018-05-30 LAB — COMPREHENSIVE METABOLIC PANEL
ALT: 18 U/L (ref 0–44)
ANION GAP: 15 (ref 5–15)
AST: 49 U/L — AB (ref 15–41)
Albumin: 4.2 g/dL (ref 3.5–5.0)
Alkaline Phosphatase: 53 U/L (ref 38–126)
BILIRUBIN TOTAL: 1.5 mg/dL — AB (ref 0.3–1.2)
BUN: 70 mg/dL — AB (ref 8–23)
CHLORIDE: 89 mmol/L — AB (ref 98–111)
CO2: 32 mmol/L (ref 22–32)
Calcium: 8.9 mg/dL (ref 8.9–10.3)
Creatinine, Ser: 2.94 mg/dL — ABNORMAL HIGH (ref 0.44–1.00)
GFR calc Af Amer: 16 mL/min — ABNORMAL LOW (ref >60)
GFR calc non Af Amer: 14 mL/min — ABNORMAL LOW (ref >60)
GLUCOSE: 116 mg/dL — AB (ref 70–99)
POTASSIUM: 3.8 mmol/L (ref 3.5–5.1)
Sodium: 136 mmol/L (ref 135–145)
Total Protein: 7.4 g/dL (ref 6.5–8.1)

## 2018-05-30 LAB — AMMONIA: AMMONIA: 21 umol/L (ref 9–35)

## 2018-05-30 MED ORDER — TIOTROPIUM BROMIDE MONOHYDRATE 18 MCG IN CAPS
18.0000 ug | ORAL_CAPSULE | Freq: Every day | RESPIRATORY_TRACT | Status: DC | PRN
  Filled 2018-05-30: qty 5

## 2018-05-30 MED ORDER — DIPHENHYDRAMINE HCL 25 MG PO CAPS: 25.0000 mg | ORAL_CAPSULE | Freq: Four times a day (QID) | ORAL | Status: DC | PRN

## 2018-05-30 MED ORDER — SODIUM CHLORIDE 0.9 % IV SOLN
1000.0000 mL | Freq: Once | INTRAVENOUS | Status: AC
  Administered 2018-05-30: 1000 mL via INTRAVENOUS

## 2018-05-30 MED ORDER — TORSEMIDE 20 MG PO TABS
60.0000 mg | ORAL_TABLET | Freq: Every day | ORAL | Status: DC
  Administered 2018-05-30 – 2018-05-31 (×2): 60 mg via ORAL
  Filled 2018-05-30: qty 3

## 2018-05-30 MED ORDER — PROMETHAZINE HCL 25 MG PO TABS
12.5000 mg | ORAL_TABLET | Freq: Four times a day (QID) | ORAL | Status: DC | PRN
  Filled 2018-05-30: qty 1

## 2018-05-30 MED ORDER — NITROGLYCERIN 0.4 MG SL SUBL: 0.4000 mg | SUBLINGUAL_TABLET | SUBLINGUAL | Status: DC | PRN

## 2018-05-30 MED ORDER — ACETAMINOPHEN 325 MG PO TABS: 650.0000 mg | ORAL_TABLET | Freq: Four times a day (QID) | ORAL | Status: DC | PRN

## 2018-05-30 MED ORDER — ASPIRIN EC 81 MG PO TBEC
81.0000 mg | DELAYED_RELEASE_TABLET | Freq: Every day | ORAL | Status: DC
  Administered 2018-05-31 – 2018-06-01 (×2): 81 mg via ORAL
  Filled 2018-05-30 (×2): qty 1

## 2018-05-30 MED ORDER — ENSURE ENLIVE PO LIQD
237.0000 mL | Freq: Three times a day (TID) | ORAL | Status: DC
  Administered 2018-05-31 – 2018-06-01 (×4): 237 mL via ORAL

## 2018-05-30 MED ORDER — ACETAMINOPHEN 650 MG RE SUPP: 650.0000 mg | Freq: Four times a day (QID) | RECTAL | Status: DC | PRN

## 2018-05-30 MED ORDER — HEPARIN SODIUM (PORCINE) 5000 UNIT/ML IJ SOLN
5000.0000 [IU] | Freq: Three times a day (TID) | INTRAMUSCULAR | Status: DC
  Administered 2018-05-30 – 2018-06-01 (×5): 5000 [IU] via SUBCUTANEOUS
  Filled 2018-05-30 (×6): qty 1

## 2018-05-30 MED ORDER — SENNOSIDES-DOCUSATE SODIUM 8.6-50 MG PO TABS: 1.0000 | ORAL_TABLET | Freq: Every evening | ORAL | Status: DC | PRN

## 2018-05-30 MED ORDER — ONDANSETRON HCL 4 MG PO TABS
4.0000 mg | ORAL_TABLET | Freq: Three times a day (TID) | ORAL | Status: DC | PRN
  Administered 2018-06-01: 4 mg via ORAL
  Filled 2018-05-30: qty 1

## 2018-05-30 MED ORDER — ADULT MULTIVITAMIN W/MINERALS CH
1.0000 | ORAL_TABLET | Freq: Every day | ORAL | Status: DC
  Administered 2018-05-31 – 2018-06-01 (×2): 1 via ORAL
  Filled 2018-05-30 (×2): qty 1

## 2018-05-30 MED ORDER — PANTOPRAZOLE SODIUM 40 MG PO TBEC
40.0000 mg | DELAYED_RELEASE_TABLET | Freq: Every day | ORAL | Status: DC
  Administered 2018-05-31 – 2018-06-01 (×2): 40 mg via ORAL
  Filled 2018-05-30 (×2): qty 1

## 2018-05-30 MED ORDER — LEVALBUTEROL HCL 1.25 MG/0.5ML IN NEBU
1.2500 mg | INHALATION_SOLUTION | Freq: Four times a day (QID) | RESPIRATORY_TRACT | Status: DC | PRN
  Filled 2018-05-30: qty 0.5

## 2018-05-30 MED ORDER — HYDROCODONE-ACETAMINOPHEN 5-325 MG PO TABS
1.0000 | ORAL_TABLET | ORAL | Status: DC | PRN
  Administered 2018-05-31: 1 via ORAL
  Filled 2018-05-30: qty 1

## 2018-05-30 MED ORDER — DOCUSATE SODIUM 100 MG PO CAPS
100.0000 mg | ORAL_CAPSULE | Freq: Two times a day (BID) | ORAL | Status: DC
  Administered 2018-05-31 (×2): 100 mg via ORAL
  Filled 2018-05-30 (×4): qty 1

## 2018-05-30 MED ORDER — ALPRAZOLAM 0.5 MG PO TABS
0.5000 mg | ORAL_TABLET | Freq: Every day | ORAL | Status: DC
  Administered 2018-05-30 – 2018-05-31 (×2): 0.5 mg via ORAL
  Filled 2018-05-30 (×2): qty 1

## 2018-05-30 MED ORDER — SODIUM CHLORIDE 0.9 % IV SOLN
INTRAVENOUS | Status: DC
  Administered 2018-05-30 – 2018-06-01 (×3): via INTRAVENOUS

## 2018-05-30 MED ORDER — SERTRALINE HCL 50 MG PO TABS
50.0000 mg | ORAL_TABLET | Freq: Every day | ORAL | Status: DC
  Administered 2018-05-30 – 2018-06-01 (×3): 50 mg via ORAL
  Filled 2018-05-30 (×3): qty 1

## 2018-05-30 MED ORDER — FLUTICASONE FUROATE-VILANTEROL 100-25 MCG/INH IN AEPB
1.0000 | INHALATION_SPRAY | Freq: Every day | RESPIRATORY_TRACT | Status: DC | PRN
  Filled 2018-05-30: qty 28

## 2018-05-30 MED ORDER — METOPROLOL SUCCINATE ER 25 MG PO TB24
25.0000 mg | ORAL_TABLET | Freq: Every day | ORAL | Status: DC
  Administered 2018-05-30 – 2018-06-01 (×3): 25 mg via ORAL
  Filled 2018-05-30 (×2): qty 1

## 2018-05-30 MED ORDER — ATORVASTATIN CALCIUM 20 MG PO TABS
20.0000 mg | ORAL_TABLET | Freq: Every day | ORAL | Status: DC
  Administered 2018-05-31 – 2018-06-01 (×2): 20 mg via ORAL
  Filled 2018-05-30 (×2): qty 1

## 2018-05-30 MED ORDER — FENOFIBRATE 160 MG PO TABS
160.0000 mg | ORAL_TABLET | Freq: Every day | ORAL | Status: DC
  Administered 2018-05-31 – 2018-06-01 (×2): 160 mg via ORAL
  Filled 2018-05-30 (×3): qty 1

## 2018-05-30 NOTE — ED Notes (Signed)
Report given to Erica, RN

## 2018-05-30 NOTE — ED Provider Notes (Signed)
[facility] Emergency Department Provider Note   ____________________________________________    I have reviewed the triage vital signs and the nursing notes.   HISTORY  Chief Complaint Altered Mental Status and Herpes Zoster     HPI Regina Burke is a 84 y.o. female who presents today with complaints of dizziness, and apparently altered mental status per family.  Patient states overall she feels quite well but when she stands up she feels very dizzy.  She denies neuro deficits.  No chest pain, palpitations or shortness of breath.  Denies dysuria.  No fevers or chills reported.  Patient notes that she was recently started on medication for shingles, she feels this medication may be responsible for making her feel dizzy and nauseated today   Past Medical History:  Diagnosis Date  . Anemia of chronic disease   . Arthritis   . Asthma   . Cardiac resynchronization therapy pacemaker (CRT-P) in place    a. 03/31/16:  Medtronic Percepta Quad CRT-P MRI SureScan (serial Number [id number]) device.  . Chronic diastolic CHF (congestive heart failure) ([place])    a. 10/2015 Echo: EF 55-65%, Gr1 DD, mild MR, mildly dil LA, nl RV fxn, nl PASP;  b. 03/2016 Echo: EF 30-35%, mod MR, mod dil LA; c. 12/2016 Echo: EF 60-65%, no rwma, mild AS, mildly dil LA, PASP 109mmHg.  . CKD (chronic kidney disease), stage IV ([place])   . COPD (chronic obstructive pulmonary disease) ([place])   . Coronary artery disease    a. 11/2014 NSTEMI/PCI: LM nl, LAD 66m, D1 30, LCX mild dzs, OM1 20p, OM2 22m, OM3 90p (2.25x8 Promus Premier DES), RCA nl.   . Cough    CHRONIC AT NIGHT  . Cushing's disease ([place])   . Depression   . Diverticulitis   . Edema    FEET/LEGS  . GERD (gastroesophageal reflux disease)   . Gout   . History of hiatal hernia   . History of pneumonia   . HLD (hyperlipidemia)   . HOH (hard of hearing)   . Hypertension   . Hypertensive heart disease   . Lung cancer ([place]) dx'd 2014     S/P radiation 2015  . Migraine   . Mixed Ischemic and Nonischemic Cardiomyopathy ([place])    a. 03/2016 Echo: EF 30-35%;  b. 12/2016 Echo: EF 60-65%.  . Moderate mitral regurgitation   . Multiple allergies   . Myocardial infarction ([place])   . Oxygen deficiency    2LITERS  . Persistent atrial fibrillation    a. CHADS2VASc ==> 7 (CHF, HTN, age x 2, DM, vascular disease, and gender)--was on renal dosed eliquis but this was d/c'd in 12/2016 in setting of dark stools/anemia;  c. 03/2016 s/p AVN and MDT BiV ICD placement.  . Presence of permanent cardiac pacemaker   . S/P AV nodal ablation    a. on 03/31/16 for persistant afib with CRT-P placement  . Sleep apnea   . Type II diabetes mellitus [facility])     Patient Active Problem List   Diagnosis Date Noted  . Encephalopathy acute 05/30/2018  . Acute on chronic heart failure ([place])   . Goals of care, counseling/discussion   . Advance care planning   . S/p left hip fracture 01/31/2017  . Pre-operative cardiovascular examination 01/31/2017  . Other diseases of stomach and duodenum   . Dizziness 08/25/2016  . LBBB (left bundle branch block) 04/01/2016  . S/P AV nodal ablation   . Cardiac resynchronization therapy  pacemaker (CRT-P) in place   . Persistent atrial fibrillation ([place]): CHA2DS2-Vasc = ~7. On Xarelto 15 mg (age & renal Fxn) 03/18/2016  . Congestive dilated cardiomyopathy ([place]) 03/18/2016    Class: Temporary  . DNR (do not resuscitate) discussion   . Palliative care encounter   . Persistent atrial fibrillation   . Chronic diastolic CHF (congestive heart failure) ([place]) 02/16/2016  . HLD (hyperlipidemia) 02/16/2016  . Iron deficiency anemia due to chronic blood loss   . Essential hypertension   . Diaphoresis   . Renal insufficiency   . Chronic obstructive pulmonary disease ([place]) 07/14/2015  . Status post thoracentesis   . S/P thoracentesis   . H/O: lung cancer   . Pleural effusion   . Coronary artery disease involving native coronary  artery of native heart without angina pectoris   . S/P coronary artery stent placement   . Pressure ulcer 06/08/2015  . Dehydration 05/09/2015  . Diabetes ([place]) 05/09/2015  . Closed fracture nasal bone 05/09/2015  . Cancer of upper lobe of right lung ([place]) 01/08/2015  . Allergic state 01/08/2015  . Anemia associated with chronic renal failure 01/08/2015  . Chronic kidney disease 01/08/2015  . CAFL (chronic airflow limitation) ([place]) 01/08/2015  . Arthritis, degenerative 01/08/2015  . 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 Diabetes mellitus, type 2 ([place]) 09/11/2014  . Type 2 diabetes mellitus ([place]) 09/11/2014    Past Surgical History:  Procedure Laterality Date  . ABDOMINAL HYSTERECTOMY    . ABLATION     July 2017  . ADRENALECTOMY Left 1980's   "[place]"  . APPENDECTOMY    . BREAST CYST EXCISION Left   . CATARACT EXTRACTION W/PHACO Right 12/28/2015   Procedure: CATARACT EXTRACTION PHACO AND INTRAOCULAR LENS PLACEMENT (IOC);  Surgeon: Birder Robson, MD;  Location: [facility] ORS;  Service: Ophthalmology;  Laterality: Right;  [place] 48.4   . CATARACT EXTRACTION W/PHACO Left 11/14/2016   Procedure: CATARACT EXTRACTION PHACO AND INTRAOCULAR LENS PLACEMENT (IOC);  Surgeon: Birder Robson, MD;  Location: [facility] ORS;  Service: Ophthalmology;  Laterality: Left;  [place] 59.8 AP% 18.1 CDE 10.78 Fluid pack lot # [id number]  . CHOLECYSTECTOMY    . CORONARY ANGIOPLASTY WITH STENT PLACEMENT  12/23/2014  . ELECTROPHYSIOLOGIC STUDY N/A 03/08/2016   Procedure: CARDIOVERSION;  Surgeon: Wende Bushy, MD;  Location: [facility] ORS;  Service: Cardiovascular;  Laterality: N/A;  . ELECTROPHYSIOLOGIC STUDY N/A 03/07/2016   Procedure: Cardioversion;  Surgeon: Wende Bushy, MD;  Location: [facility] ORS;  Service: Cardiovascular;  Laterality: N/A;  . ELECTROPHYSIOLOGIC STUDY N/A 03/31/2016   Procedure: AV Node Ablation;  Surgeon: Will Meredith Leeds, MD;  Location: [place] CV LAB;  Service: Cardiovascular;  Laterality: N/A;    . EP IMPLANTABLE DEVICE N/A 03/31/2016   Procedure: BiV Pacemaker Insertion CRT-P;  Surgeon: Will Meredith Leeds, MD;  Location: [place] CV LAB;  Service: Cardiovascular;  Laterality: N/A;  . ESOPHAGOGASTRODUODENOSCOPY (EGD) WITH PROPOFOL N/A 01/26/2017   Procedure: ESOPHAGOGASTRODUODENOSCOPY (EGD) WITH PROPOFOL;  Surgeon: [name], MD;  Location: [facility] ENDOSCOPY;  Service: Endoscopy;  Laterality: N/A;  . FRACTURE SURGERY    . HIP PINNING,CANNULATED Left 02/01/2017   Procedure: CANNULATED HIP PINNING;  Surgeon: Lovell Sheehan, MD;  Location: [facility] ORS;  Service: Orthopedics;  Laterality: Left;  . INSERT / REPLACE / REMOVE PACEMAKER  02/2016  . LEFT HEART CATHETERIZATION WITH CORONARY ANGIOGRAM N/A 12/23/2014   Procedure: LEFT HEART CATHETERIZATION WITH CORONARY ANGIOGRAM;  Surgeon: Burnell Blanks, MD;  Location: [facility] CATH LAB;  Service: Cardiovascular;  Laterality:  N/A;  . PERCUTANEOUS CORONARY STENT INTERVENTION (PCI-S)  12/23/2014   Procedure: PERCUTANEOUS CORONARY STENT INTERVENTION (PCI-S);  Surgeon: Burnell Blanks, MD;  Location: [facility] CATH LAB;  Service: Cardiovascular;;  Promus 2.25x8  . TONSILLECTOMY    . TRANSTHORACIC ECHOCARDIOGRAM  11/26/2015   Technically difficult study. EF 55-60%. Normal wall motion. GR 1 DD.  . TUBAL LIGATION    . WRIST FRACTURE SURGERY Bilateral ~ 2000    Prior to Admission medications   Medication Sig Start Date End Date Taking? Authorizing Provider  ALPRAZolam Duanne Moron) 0.5 MG tablet Take 0.5 mg by mouth at bedtime. 12/27/16   [provider]  atorvastatin (LIPITOR) 20 MG tablet Take 20 mg by mouth daily.    [provider]  feeding supplement, ENSURE ENLIVE, (ENSURE ENLIVE) LIQD Take 237 mLs by mouth 3 (three) times daily with meals. 03/02/17   Fritzi Mandes, MD  fenofibrate (TRICOR) 48 MG tablet Take 48 mg by mouth every evening.     [provider]  fluticasone furoate-vilanterol (BREO ELLIPTA) 100-25 MCG/INH AEPB Inhale 1  puff into the lungs daily as needed (shortness of breath).     [provider]  levalbuterol (XOPENEX) 1.25 MG/0.5ML nebulizer solution Take 1.25 mg by nebulization every 6 (six) hours as needed for wheezing or shortness of breath. 03/02/17   Fritzi Mandes, MD  metoprolol succinate (TOPROL XL) 25 MG 24 hr tablet Take 25 mg by mouth daily.  12/19/16   Minna Merritts, MD  morphine 10 MG/5ML solution Take 5 mg by mouth daily as needed for severe pain.    [provider]  nitroGLYCERIN (NITROSTAT) 0.4 MG SL tablet Place 1 tablet (0.4 mg total) under the tongue every 5 (five) minutes as needed for chest pain. 12/24/14   Barrett, Evelene Croon, PA-C  omeprazole (PRILOSEC) 20 MG capsule Take 20 mg by mouth daily. 02/26/18   [provider]  ondansetron (ZOFRAN) 4 MG tablet Take 4 mg by mouth every 8 (eight) hours as needed. for nausea 02/26/18   [provider]  sertraline (ZOLOFT) 50 MG tablet Take 1 tablet (50 mg total) by mouth daily. 03/24/16   Gladstone Lighter, MD  tiotropium (SPIRIVA) 18 MCG inhalation capsule Place 18 mcg into inhaler and inhale daily as needed (shortness of breath).     [provider]  torsemide (DEMADEX) 20 MG tablet Take 3 tablets (60 mg total) by mouth daily. 01/22/18   Fritzi Mandes, MD  traMADol (ULTRAM) 50 MG tablet Take 50 mg by mouth 2 (two) times daily as needed. for pain 03/06/18   [provider]     Allergies Ciprofloxacin; Doxycycline; Penicillins; Sulfa antibiotics; Latex; Morphine and related; Albuterol sulfate; Macrobid [nitrofurantoin macrocrystal]; and Cefuroxime  Family History  Problem Relation Age of Onset  . Heart disease Mother   . Diabetes Mother   . Osteoarthritis Mother   . Hypertension Mother   . Heart disease Father   . Hypertension Father   . COPD Brother     Social History Social History   Tobacco Use  . Smoking status: Former Smoker    Packs/day: 1.00    Years: 45.00    Pack years: 45.00     Types: Cigarettes    Last attempt to quit: 04/25/1994    Years since quitting: 24.1  . Smokeless tobacco: Never Used  Substance Use Topics  . Alcohol use: No    Comment: 12/23/2014 "might have a couple mixed drinks/year"  . Drug use: No  Review of Systems  Constitutional: No fever/chills Eyes: No visual changes.  ENT: No sore throat. Cardiovascular: Denies chest pain. Respiratory: Denies shortness of breath. Gastrointestinal: As above Genitourinary: Negative for dysuria. Musculoskeletal: Negative for back pain. Skin: Negative for rash. Neurological: Negative for headaches    ____________________________________________   PHYSICAL EXAM:  VITAL SIGNS: ED Triage Vitals  Enc Vitals Group     BP 05/30/18 1245 (!) 120/56     Pulse Rate 05/30/18 1300 66     Resp 05/30/18 1245 12     Temp 05/30/18 1245 98.4 F (36.9 C)     Temp Source 05/30/18 1245 Oral     SpO2 05/30/18 1300 100 %     Weight 05/30/18 1250 77.6 kg (171 lb)     Height 05/30/18 1250 1.626 m (5\' 4" )     Head Circumference --      Peak Flow --      Pain Score 05/30/18 1249 0     Pain Loc --      Pain Edu? --      Excl. in [place]? --     Constitutional: Alert and oriented.   Nose: No congestion/rhinnorhea. Mouth/Throat: Mucous membranes are moist.    Cardiovascular: Normal rate, regular rhythm. Grossly normal heart sounds.  Good peripheral circulation. Respiratory: Normal respiratory effort.  No retractions. Gastrointestinal: Soft and nontender. No distention.   Musculoskeletal:   Warm and well perfused, normal strength in lower extremities Neurologic:  Normal speech and language. No gross focal neurologic deficits are appreciated.  Skin:  Skin is warm, dry and intact.  Shingles rash right thoracic back extending around to the right chest Psychiatric: Mood and affect are normal. Speech and behavior are normal.  ____________________________________________   LABS (all labs ordered are listed, but only  abnormal results are displayed)  Labs Reviewed  COMPREHENSIVE METABOLIC PANEL - Abnormal; Notable for the following components:      Result Value   Chloride 89 (*)    Glucose, Bld 116 (*)    BUN 70 (*)    Creatinine, Ser 2.94 (*)    AST 49 (*)    Total Bilirubin 1.5 (*)    GFR calc non Af Amer 14 (*)    GFR calc Af Amer 16 (*)    All other components within normal limits  CBC - Abnormal; Notable for the following components:   WBC 2.7 (*)    RBC 3.50 (*)    Hemoglobin 9.9 (*)    HCT 28.0 (*)    MCV 79.9 (*)    RDW 18.9 (*)    Platelets 120 (*)    All other components within normal limits  URINALYSIS, COMPLETE (UACMP) WITH MICROSCOPIC  AMMONIA  RPR  CBG MONITORING, ED   ____________________________________________  EKG  None ____________________________________________  RADIOLOGY None ____________________________________________   PROCEDURES  Procedure(s) performed: No  Procedures   Critical Care performed: No ____________________________________________   INITIAL IMPRESSION / ASSESSMENT AND PLAN / ED COURSE  Pertinent labs & imaging results that were available during my care of the patient were reviewed by me and considered in my medical decision making (see chart for details).  Patient presents with dizziness, fatigue, recently diagnosed with shingles, recently started on Valtrex.  Lab work is significant for increase in creatinine from baseline 2.2-2.9 with a BUN of 70, certainly dehydration may be contributing to her fatigue and dizziness.  Also notable white blood cell count is only 2.7 and her platelets are 120  Will discuss  with hospitalist for admission for further evaluation work-up, hydration    ____________________________________________   FINAL CLINICAL IMPRESSION(S) / ED DIAGNOSES  Final diagnoses:  Dehydration  Dizziness  Acute renal failure superimposed on chronic kidney disease, unspecified CKD stage, unspecified acute renal failure  type [facility])        Note:  This document was prepared using Dragon voice recognition software and may include unintentional dictation errors.    Lavonia Drafts, MD 05/30/18 [phone]

## 2018-05-30 NOTE — ED Triage Notes (Signed)
Pt picked up from EMS at home. AMS per family. Pt has shingles. BG 149 per EMS. EMS placed 22g IV in left hand. On 2L O2 chronic sat 97%. History of PM.

## 2018-05-30 NOTE — Progress Notes (Signed)
Family Meeting Note  Advance Directive:yes  Today a meeting took place with the Patient and son.  Patient is unable to participate due WG:[id number] capacity ams   The following clinical team members were present during this meeting:MD  The following were discussed:Patient's diagnosis: Chronic hypoxic respiratory failure, chronic kidney disease stage IV, heart failure, diabetes, Patient's progosis: Unable to determine and Goals for treatment: DNR  Additional follow-up to be provided: prn  Time spent during discussion:20 minutes  Gorden Harms, MD

## 2018-05-30 NOTE — H&P (Signed)
[facility] [place] NAME: Regina Burke    MR#:  [id number]  DATE OF BIRTH:  07/08/1933  DATE OF ADMISSION:  05/30/2018  PRIMARY CARE PHYSICIAN: Tracie Harrier, MD   REQUESTING/REFERRING PHYSICIAN:   CHIEF COMPLAINT:   Chief Complaint  Patient presents with  . Altered Mental Status  . Herpes Zoster    HISTORY OF PRESENT ILLNESS: Regina Burke  is a 84 y.o. female with a known history per below, recent diagnosis of shingles, presenting to the emergency room with acute altered mental status that occurred earlier this morning, odd behavior per son, patient noted to be increasingly confused and disoriented patient recently started on unknown shingles medication earlier this week, patient is only complaining of itching along rash area, patient denies any numbness/tingling/blurry vision/weakness, in the emergency room creatinine was 2.9 with baseline around 2.5, hemoglobin 9.9, head CT is pending, patient evaluated in the emergency room in front of son, patient is poor historian due to altered mental status, patient is now being admitted for acute encephalopathy, mild acute kidney injury with chronic kidney disease stage IV, and subacute shingles.   PAST MEDICAL HISTORY:   Past Medical History:  Diagnosis Date  . Anemia of chronic disease   . Arthritis   . Asthma   . Cardiac resynchronization therapy pacemaker (CRT-P) in place    a. 03/31/16:  Medtronic Percepta Quad CRT-P MRI SureScan (serial Number [id number]) device.  . Chronic diastolic CHF (congestive heart failure) ([place])    a. 10/2015 Echo: EF 55-65%, Gr1 DD, mild MR, mildly dil LA, nl RV fxn, nl PASP;  b. 03/2016 Echo: EF 30-35%, mod MR, mod dil LA; c. 12/2016 Echo: EF 60-65%, no rwma, mild AS, mildly dil LA, PASP 53mmHg.  . CKD (chronic kidney disease), stage IV ([place])   . COPD (chronic obstructive pulmonary disease) ([place])   . Coronary artery disease    a. 11/2014 NSTEMI/PCI: LM nl, LAD 80m,  D1 30, LCX mild dzs, OM1 20p, OM2 68m, OM3 90p (2.25x8 Promus Premier DES), RCA nl.   . Cough    CHRONIC AT NIGHT  . Cushing's disease ([place])   . Depression   . Diverticulitis   . Edema    FEET/LEGS  . GERD (gastroesophageal reflux disease)   . Gout   . History of hiatal hernia   . History of pneumonia   . HLD (hyperlipidemia)   . HOH (hard of hearing)   . Hypertension   . Hypertensive heart disease   . Lung cancer ([place]) dx'd 2014   S/P radiation 2015  . Migraine   . Mixed Ischemic and Nonischemic Cardiomyopathy ([place])    a. 03/2016 Echo: EF 30-35%;  b. 12/2016 Echo: EF 60-65%.  . Moderate mitral regurgitation   . Multiple allergies   . Myocardial infarction ([place])   . Oxygen deficiency    2LITERS  . Persistent atrial fibrillation    a. CHADS2VASc ==> 7 (CHF, HTN, age x 2, DM, vascular disease, and gender)--was on renal dosed eliquis but this was d/c'd in 12/2016 in setting of dark stools/anemia;  c. 03/2016 s/p AVN and MDT BiV ICD placement.  . Presence of permanent cardiac pacemaker   . S/P AV nodal ablation    a. on 03/31/16 for persistant afib with CRT-P placement  . Sleep apnea   . Type II diabetes mellitus ([place])     PAST SURGICAL HISTORY:  Past Surgical History:  Procedure Laterality Date  . ABDOMINAL  HYSTERECTOMY    . ABLATION     July 2017  . ADRENALECTOMY Left 1980's   "[place]"  . APPENDECTOMY    . BREAST CYST EXCISION Left   . CATARACT EXTRACTION W/PHACO Right 12/28/2015   Procedure: CATARACT EXTRACTION PHACO AND INTRAOCULAR LENS PLACEMENT (IOC);  Surgeon: Birder Robson, MD;  Location: [facility] ORS;  Service: Ophthalmology;  Laterality: Right;  [place] 48.4   . CATARACT EXTRACTION W/PHACO Left 11/14/2016   Procedure: CATARACT EXTRACTION PHACO AND INTRAOCULAR LENS PLACEMENT (IOC);  Surgeon: Birder Robson, MD;  Location: [facility] ORS;  Service: Ophthalmology;  Laterality: Left;  [place] 59.8 AP% 18.1 CDE 10.78 Fluid pack lot # [id number]  . CHOLECYSTECTOMY    . CORONARY  ANGIOPLASTY WITH STENT PLACEMENT  12/23/2014  . ELECTROPHYSIOLOGIC STUDY N/A 03/08/2016   Procedure: CARDIOVERSION;  Surgeon: Wende Bushy, MD;  Location: [facility] ORS;  Service: Cardiovascular;  Laterality: N/A;  . ELECTROPHYSIOLOGIC STUDY N/A 03/07/2016   Procedure: Cardioversion;  Surgeon: Wende Bushy, MD;  Location: [facility] ORS;  Service: Cardiovascular;  Laterality: N/A;  . ELECTROPHYSIOLOGIC STUDY N/A 03/31/2016   Procedure: AV Node Ablation;  Surgeon: Will Meredith Leeds, MD;  Location: [place] CV LAB;  Service: Cardiovascular;  Laterality: N/A;  . EP IMPLANTABLE DEVICE N/A 03/31/2016   Procedure: BiV Pacemaker Insertion CRT-P;  Surgeon: Will Meredith Leeds, MD;  Location: [place] CV LAB;  Service: Cardiovascular;  Laterality: N/A;  . ESOPHAGOGASTRODUODENOSCOPY (EGD) WITH PROPOFOL N/A 01/26/2017   Procedure: ESOPHAGOGASTRODUODENOSCOPY (EGD) WITH PROPOFOL;  Surgeon: [name], MD;  Location: [facility] ENDOSCOPY;  Service: Endoscopy;  Laterality: N/A;  . FRACTURE SURGERY    . HIP PINNING,CANNULATED Left 02/01/2017   Procedure: CANNULATED HIP PINNING;  Surgeon: Lovell Sheehan, MD;  Location: [facility] ORS;  Service: Orthopedics;  Laterality: Left;  . INSERT / REPLACE / REMOVE PACEMAKER  02/2016  . LEFT HEART CATHETERIZATION WITH CORONARY ANGIOGRAM N/A 12/23/2014   Procedure: LEFT HEART CATHETERIZATION WITH CORONARY ANGIOGRAM;  Surgeon: Burnell Blanks, MD;  Location[facility] CATH LAB;  Service: Cardiovascular;  Laterality: N/A;  . PERCUTANEOUS CORONARY STENT INTERVENTION (PCI-S)  12/23/2014   Procedure: PERCUTANEOUS CORONARY STENT INTERVENTION (PCI-S);  Surgeon: Burnell Blanks, MD;  Location: [facility] CATH LAB;  Service: Cardiovascular;;  Promus 2.25x8  . TONSILLECTOMY    . TRANSTHORACIC ECHOCARDIOGRAM  11/26/2015   Technically difficult study. EF 55-60%. Normal wall motion. GR 1 DD.  . TUBAL LIGATION    . WRIST FRACTURE SURGERY Bilateral ~ 2000    SOCIAL HISTORY:  Social History   Tobacco Use   . Smoking status: Former Smoker    Packs/day: 1.00    Years: 45.00    Pack years: 45.00    Types: Cigarettes    Last attempt to quit: 04/25/1994    Years since quitting: 24.1  . Smokeless tobacco: Never Used  Substance Use Topics  . Alcohol use: No    Comment: 12/23/2014 "might have a couple mixed drinks/year"    FAMILY HISTORY:  Family History  Problem Relation Age of Onset  . Heart disease Mother   . Diabetes Mother   . Osteoarthritis Mother   . Hypertension Mother   . Heart disease Father   . Hypertension Father   . COPD Brother     DRUG ALLERGIES:  Allergies  Allergen Reactions  . Ciprofloxacin Shortness Of Breath, Itching and Rash  . Doxycycline Shortness Of Breath, Itching and Rash  . Penicillins Shortness Of Breath, Itching, Rash and Other (See Comments)    Has patient  had a PCN reaction causing immediate rash, facial/tongue/throat swelling, SOB or lightheadedness with hypotension: Yes Has patient had a PCN reaction causing severe rash involving mucus membranes or skin necrosis: No Has patient had a PCN reaction that required hospitalization No Has patient had a PCN reaction occurring within the last 10 years: No If all of the above answers are "NO", then may proceed with Cephalosporin use.  . Sulfa Antibiotics Shortness Of Breath, Itching and Rash  . Latex Itching  . Morphine And Related Itching  . Albuterol Sulfate   . Macrobid [Nitrofurantoin Macrocrystal] Itching  . Cefuroxime Rash    Blisters in mouth    REVIEW OF SYSTEMS: Poor historian due to encephalopathy  CONSTITUTIONAL: No fever, fatigue or weakness.  EYES: No blurred or double vision.  EARS, NOSE, AND THROAT: No tinnitus or ear pain.  RESPIRATORY: No cough, shortness of breath, wheezing or hemoptysis.  CARDIOVASCULAR: No chest pain, orthopnea, edema.  GASTROINTESTINAL: No nausea, vomiting, diarrhea or abdominal pain.  GENITOURINARY: No dysuria, hematuria.  ENDOCRINE: No polyuria, nocturia,   HEMATOLOGY: No anemia, easy bruising or bleeding SKIN: No rash or lesion. MUSCULOSKELETAL: No joint pain or arthritis.   NEUROLOGIC: No tingling, numbness, weakness.  PSYCHIATRY: No anxiety or depression.   MEDICATIONS AT HOME:  Prior to Admission medications   Medication Sig Start Date End Date Taking? Authorizing Provider  ALPRAZolam Duanne Moron) 0.5 MG tablet Take 0.5 mg by mouth at bedtime. 12/27/16   [provider]  atorvastatin (LIPITOR) 20 MG tablet Take 20 mg by mouth daily.    [provider]  feeding supplement, ENSURE ENLIVE, (ENSURE ENLIVE) LIQD Take 237 mLs by mouth 3 (three) times daily with meals. 03/02/17   Fritzi Mandes, MD  fenofibrate (TRICOR) 48 MG tablet Take 48 mg by mouth every evening.     [provider]  fluticasone furoate-vilanterol (BREO ELLIPTA) 100-25 MCG/INH AEPB Inhale 1 puff into the lungs daily as needed (shortness of breath).     [provider]  levalbuterol (XOPENEX) 1.25 MG/0.5ML nebulizer solution Take 1.25 mg by nebulization every 6 (six) hours as needed for wheezing or shortness of breath. 03/02/17   Fritzi Mandes, MD  metoprolol succinate (TOPROL XL) 25 MG 24 hr tablet Take 25 mg by mouth daily.  12/19/16   Minna Merritts, MD  morphine 10 MG/5ML solution Take 5 mg by mouth daily as needed for severe pain.    [provider]  nitroGLYCERIN (NITROSTAT) 0.4 MG SL tablet Place 1 tablet (0.4 mg total) under the tongue every 5 (five) minutes as needed for chest pain. 12/24/14   Barrett, Evelene Croon, PA-C  omeprazole (PRILOSEC) 20 MG capsule Take 20 mg by mouth daily. 02/26/18   [provider]  ondansetron (ZOFRAN) 4 MG tablet Take 4 mg by mouth every 8 (eight) hours as needed. for nausea 02/26/18   [provider]  sertraline (ZOLOFT) 50 MG tablet Take 1 tablet (50 mg total) by mouth daily. 03/24/16   Gladstone Lighter, MD  tiotropium (SPIRIVA) 18 MCG inhalation capsule Place 18 mcg into inhaler and inhale daily  as needed (shortness of breath).     [provider]  torsemide (DEMADEX) 20 MG tablet Take 3 tablets (60 mg total) by mouth daily. 01/22/18   Fritzi Mandes, MD  traMADol (ULTRAM) 50 MG tablet Take 50 mg by mouth 2 (two) times daily as needed. for pain 03/06/18   [provider]      PHYSICAL EXAMINATION:   VITAL SIGNS:  Blood pressure (!) 146/69, pulse 70, temperature 98.4 F (36.9 C), temperature source Oral, resp. rate 13, height 5\' 4"  (1.626 m), weight 77.6 kg, SpO2 100 %.  GENERAL:  84 y.o.-year-old patient lying in the bed with no acute distress.  Obese, frail-appearing EYES: Pupils equal, round, reactive to light and accommodation. No scleral icterus. Extraocular muscles intact.  HEENT: Head atraumatic, normocephalic. Oropharynx and nasopharynx clear.  NECK:  Supple, no jugular venous distention. No thyroid enlargement, no tenderness.  LUNGS: Normal breath sounds bilaterally, no wheezing, rales,rhonchi or crepitation. No use of accessory muscles of respiration.  CARDIOVASCULAR: S1, S2 normal. No murmurs, rubs, or gallops.  ABDOMEN: Soft, nontender, nondistended. Bowel sounds present. No organomegaly or mass.  EXTREMITIES: No pedal edema, cyanosis, or clubbing.  NEUROLOGIC: Cranial nerves II through XII are intact. Muscle strength 5/5 in all extremities. Sensation intact. Gait not checked.  PSYCHIATRIC: The patient is awake, alert, confused and disoriented   SKIN: No obvious rash, lesion, or ulcer.   LABORATORY PANEL:   CBC Recent Labs  Lab 05/30/18 1308  WBC 2.7*  HGB 9.9*  HCT 28.0*  PLT 120*  MCV 79.9*  MCH 28.1  MCHC 35.2  RDW 18.9*   ------------------------------------------------------------------------------------------------------------------  Chemistries  Recent Labs  Lab 05/30/18 1308  NA 136  K 3.8  CL 89*  CO2 32  GLUCOSE 116*  BUN 70*  CREATININE 2.94*  CALCIUM 8.9  AST 49*  ALT 18  ALKPHOS 53  BILITOT 1.5*    ------------------------------------------------------------------------------------------------------------------ estimated creatinine clearance is 14.9 mL/min (A) (by C-G formula based on SCr of 2.94 mg/dL (H)). ------------------------------------------------------------------------------------------------------------------ No results for input(s): TSH, T4TOTAL, T3FREE, THYROIDAB in the last 72 hours.  Invalid input(s): FREET3   Coagulation profile No results for input(s): INR, PROTIME in the last 168 hours. ------------------------------------------------------------------------------------------------------------------- No results for input(s): DDIMER in the last 72 hours. -------------------------------------------------------------------------------------------------------------------  Cardiac Enzymes No results for input(s): CKMB, TROPONINI, MYOGLOBIN in the last 168 hours.  Invalid input(s): CK ------------------------------------------------------------------------------------------------------------------ Invalid input(s): POCBNP  ---------------------------------------------------------------------------------------------------------------  Urinalysis    Component Value Date/Time   COLORURINE AMBER (A) 01/15/2018 0430   APPEARANCEUR CLEAR (A) 01/15/2018 0430   LABSPEC 1.015 01/15/2018 0430   PHURINE 5.0 01/15/2018 0430   GLUCOSEU NEGATIVE 01/15/2018 0430   HGBUR SMALL (A) 01/15/2018 0430   BILIRUBINUR NEGATIVE 01/15/2018 0430   KETONESUR NEGATIVE 01/15/2018 0430   PROTEINUR NEGATIVE 01/15/2018 0430   NITRITE NEGATIVE 01/15/2018 0430   LEUKOCYTESUR NEGATIVE 01/15/2018 0430     RADIOLOGY: No results found.  EKG: Orders placed or performed during the hospital encounter of 03/21/18  . ED EKG within 10 minutes  . ED EKG within 10 minutes    IMPRESSION AND PLAN: *Acute encephalopathy Exact etiology unknown- ?  Secondary to recently started shingles  medication Admit to regular nursing floor bed, complete MAR when available, avoid psychotropic meds, neurochecks per routine, check stat head CT for further evaluation, ammonia level, RPR, neurology to see, and continue close medical monitoring  *AKI-mild, CKD stage IV BL Cr 2.5 Avoid nephrotoxic agents, gentle IV fluids for rehydration, BMP in the morning  *Subacute shingles Stable Complete MAR when available, antihistamines as needed, adult pain protocol  *Chronic hypoxic respiratory failure Stable on 2 L via nasal cannula chronically  *COPD/asthma without exacerbation Stable Breathing treatments as needed  *Chronic diabetes mellitus type 2 Sliding scale insulin with Accu-Cheks per routine  *Chronic GERD without esophagitis Stable PPI daily  *Chronic diastolic congestive heart failure without exacerbation Stable Continue Lipitor, Toprol-XL, torsemide  *  Chronic depression Stable Continue Zoloft   All the records are reviewed and case discussed with ED provider. Management plans discussed with the patient, family and they are in agreement.  CODE STATUS:DNR Code Status History    Date Active Date Inactive Code Status Order ID Comments User Context   01/18/2018 1820 01/22/2018 1541 DNR [id number]  Jimmy Footman, NP Inpatient   01/15/2018 1526 01/18/2018 1820 Full Code [id number]  Bettey Costa, MD Inpatient   01/15/2018 1312 01/15/2018 1526 DNR [id number]  Bettey Costa, MD ED   02/27/2017 0903 03/02/2017 1810 DNR [id number]  Hillary Bow, MD ED   01/31/2017 1523 02/05/2017 2139 Full Code [id number]  Nicholes Mango, MD Inpatient   01/23/2017 1648 01/30/2017 1831 DNR [id number]  Fritzi Mandes, MD ED   01/12/2017 1114 01/13/2017 1836 Full Code [id number]  Epifanio Lesches, MD ED   03/30/2016 1152 04/03/2016 1611 DNR [id number]  Erma Heritage, [place] Inpatient   03/16/2016 1610 03/24/2016 1906 DNR [id number]  Awilda Bill, NP ED   03/08/2016 1543 03/14/2016 1940 Partial Code [id number]   Knox Royalty, NP Inpatient   03/04/2016 2228 03/08/2016 1543 Full Code [id number]  Holley Raring, NP ED   01/19/2016 1956 01/27/2016 1801 Partial Code [id number]  Vaughan Basta, MD ED   11/26/2015 0402 11/26/2015 2059 Full Code [id number]  Silver Huguenin, RN Inpatient   06/14/2015 1857 06/15/2015 2054 DNR [id number]  Colleen Can, MD Inpatient   06/07/2015 2220 06/14/2015 1856 Full Code [id number]  Lytle Butte, MD ED   05/15/2015 1527 05/24/2015 1704 Full Code [id number]  Gladstone Lighter, MD ED   05/08/2015 1123 05/11/2015 1513 Full Code [id number]  Dustin Flock, MD ED   04/15/2015 0001 04/15/2015 1903 Full Code [id number]  Lance Coon, MD Inpatient   12/23/2014 1309 12/24/2014 1717 Full Code [id number]  Burnell Blanks, MD Inpatient   12/23/2014 0422 12/23/2014 1309 Full Code [id number]  Alwyn Pea, MD ED    Questions for Most Recent Historical Code Status (Order [id number])    Question Answer Comment   In the event of cardiac or respiratory ARREST Do not call a "code blue"    In the event of cardiac or respiratory ARREST Do not perform Intubation, CPR, defibrillation or ACLS    In the event of cardiac or respiratory ARREST Use medication by any route, position, wound care, and other measures to relive pain and suffering. May use oxygen, suction and manual treatment of airway obstruction as needed for comfort.         Advance Directive Documentation     Most Recent Value  Type of Advance Directive  Healthcare Power of Attorney, Living will  Pre-existing out of facility DNR order (yellow form or pink MOST form)  -  "MOST" Form in Place?  -       TOTAL TIME TAKING CARE OF THIS PATIENT: 40 minutes.    Avel Peace Seanpaul Preece M.D on 05/30/2018   Between 7am to 6pm - Pager - [phone]  After 6pm go to [email or web address] - password [email or web address] [facility]  Office  [phone]  CC: Primary care physician; Tracie Harrier, MD   Note: This dictation  was prepared with Dragon dictation along with smaller phrase technology. Any transcriptional errors that result from this process are unintentional.

## 2018-05-31 ENCOUNTER — Inpatient Hospital Stay: Payer: Medicare Other

## 2018-05-31 DIAGNOSIS — G9340 Encephalopathy, unspecified: Secondary | ICD-10-CM

## 2018-05-31 LAB — BASIC METABOLIC PANEL
ANION GAP: 12 (ref 5–15)
BUN: 55 mg/dL — ABNORMAL HIGH (ref 8–23)
CALCIUM: 8.5 mg/dL — AB (ref 8.9–10.3)
CO2: 33 mmol/L — ABNORMAL HIGH (ref 22–32)
Chloride: 95 mmol/L — ABNORMAL LOW (ref 98–111)
Creatinine, Ser: 2.34 mg/dL — ABNORMAL HIGH (ref 0.44–1.00)
GFR, EST AFRICAN AMERICAN: 21 mL/min — AB (ref >60)
GFR, EST NON AFRICAN AMERICAN: 18 mL/min — AB (ref >60)
GLUCOSE: 104 mg/dL — AB (ref 70–99)
Potassium: 3.4 mmol/L — ABNORMAL LOW (ref 3.5–5.1)
SODIUM: 140 mmol/L (ref 135–145)

## 2018-05-31 MED ORDER — BUSPIRONE HCL 15 MG PO TABS
7.5000 mg | ORAL_TABLET | Freq: Two times a day (BID) | ORAL | Status: DC
  Administered 2018-05-31 – 2018-06-01 (×3): 7.5 mg via ORAL
  Filled 2018-05-31 (×4): qty 1

## 2018-05-31 MED ORDER — VALACYCLOVIR HCL 500 MG PO TABS
1000.0000 mg | ORAL_TABLET | Freq: Every day | ORAL | Status: DC
  Filled 2018-05-31: qty 2

## 2018-05-31 MED ORDER — VALACYCLOVIR HCL 500 MG PO TABS
1000.0000 mg | ORAL_TABLET | Freq: Three times a day (TID) | ORAL | Status: DC
  Administered 2018-05-31: 1000 mg via ORAL
  Filled 2018-05-31 (×3): qty 2

## 2018-05-31 MED ORDER — VITAMIN B-12 1000 MCG PO TABS
1000.0000 ug | ORAL_TABLET | Freq: Every day | ORAL | Status: DC
  Administered 2018-05-31 – 2018-06-01 (×2): 1000 ug via ORAL
  Filled 2018-05-31 (×2): qty 1

## 2018-05-31 MED ORDER — POTASSIUM CHLORIDE CRYS ER 20 MEQ PO TBCR
40.0000 meq | EXTENDED_RELEASE_TABLET | ORAL | Status: AC
  Administered 2018-05-31 (×2): 40 meq via ORAL
  Filled 2018-05-31 (×2): qty 2

## 2018-05-31 NOTE — Care Management Obs Status (Deleted)
[place] NOTIFICATION   Patient Details  Name: NAYLEE FRANKOWSKI MRN: [id number] Date of Birth: 21-Apr-1933   Medicare Observation Status Notification Given:  Yes    Elza Rafter, RN 05/31/2018, 5:00 PM

## 2018-05-31 NOTE — Progress Notes (Signed)
[place] [facility] at [place] NAME: Regina Burke    MR#:  [id number]  DATE OF BIRTH:  [birth year]  SUBJECTIVE: Patient admitted for altered mental status, acute renal failure.  Today she is very alert, renal failure improved with IV fluids.  Does have shingles on her back.  Started on Valtrex recently.  CHIEF COMPLAINT:   Chief Complaint  Patient presents with  . Altered Mental Status  . Herpes Zoster    REVIEW OF SYSTEMS:    Review of Systems  Constitutional: Negative for chills and fever.  HENT: Negative for hearing loss.   Eyes: Negative for blurred vision, double vision and photophobia.  Respiratory: Negative for cough, hemoptysis and shortness of breath.   Cardiovascular: Negative for palpitations, orthopnea and leg swelling.  Gastrointestinal: Negative for abdominal pain, diarrhea and vomiting.  Genitourinary: Negative for dysuria and urgency.  Musculoskeletal: Negative for myalgias and neck pain.  Skin: Negative for rash.  Neurological: Negative for dizziness, focal weakness, seizures, weakness and headaches.  Psychiatric/Behavioral: Negative for memory loss. The patient does not have insomnia.     Nutrition: Tolerating Diet: Tolerating PT:      DRUG ALLERGIES:   Allergies  Allergen Reactions  . Ciprofloxacin Shortness Of Breath, Itching and Rash  . Doxycycline Shortness Of Breath, Itching and Rash  . Penicillins Shortness Of Breath, Itching, Rash and Other (See Comments)    Has patient had a PCN reaction causing immediate rash, facial/tongue/throat swelling, SOB or lightheadedness with hypotension: Yes Has patient had a PCN reaction causing severe rash involving mucus membranes or skin necrosis: No Has patient had a PCN reaction that required hospitalization No Has patient had a PCN reaction occurring within the last 10 years: No If all of the above answers are "NO", then may proceed with Cephalosporin use.  . Sulfa  Antibiotics Shortness Of Breath, Itching and Rash  . Latex Itching  . Morphine And Related Itching  . Albuterol Sulfate   . Macrobid [Nitrofurantoin Macrocrystal] Itching  . Cefuroxime Rash    Blisters in mouth    VITALS:  Blood pressure (!) 156/61, pulse 72, temperature 98.2 F (36.8 C), temperature source Oral, resp. rate 18, height 5\' 4"  (1.626 m), weight 77.6 kg, SpO2 96 %.  PHYSICAL EXAMINATION:   Physical Exam  GENERAL:  84 y.o.-year-old patient lying in the bed with no acute distress.  EYES: Pupils equal, round, reactive to light and accommodation. No scleral icterus. Extraocular muscles intact.  HEENT: Head atraumatic, normocephalic. Oropharynx and nasopharynx clear.  NECK:  Supple, no jugular venous distention. No thyroid enlargement, no tenderness.  LUNGS: Normal breath sounds bilaterally, no wheezing, rales,rhonchi or crepitation. No use of accessory muscles of respiration.  CARDIOVASCULAR: S1, S2 normal. No murmurs, rubs, or gallops.  ABDOMEN: Soft, nontender, nondistended. Bowel sounds present. No organomegaly or mass.  EXTREMITIES: No pedal edema, cyanosis, or clubbing.  NEUROLOGIC: Cranial nerves II through XII are intact. Muscle strength 5/5 in all extremities. Sensation intact. Gait not checked.  PSYCHIATRIC: The patient is alert and oriented x 3.  SKIN: Shingles rash dry and crusted lesions present on posterior aspect of the right upper chest.  LABORATORY PANEL:   CBC Recent Labs  Lab 05/30/18 1308  WBC 2.7*  HGB 9.9*  HCT 28.0*  PLT 120*   ------------------------------------------------------------------------------------------------------------------  Chemistries  Recent Labs  Lab 05/30/18 1308 05/31/18 0651  NA 136 140  K 3.8 3.4*  CL 89* 95*  CO2 32 33*  GLUCOSE  116* 104*  BUN 70* 55*  CREATININE 2.94* 2.34*  CALCIUM 8.9 8.5*  AST 49*  --   ALT 18  --   ALKPHOS 53  --   BILITOT 1.5*  --     ------------------------------------------------------------------------------------------------------------------  Cardiac Enzymes No results for input(s): TROPONINI in the last 168 hours. ------------------------------------------------------------------------------------------------------------------  RADIOLOGY:  Ct Head Wo Contrast  Result Date: 05/30/2018 CLINICAL DATA:  84 year old female with acute altered mental status and confusion today. EXAM: CT HEAD WITHOUT CONTRAST TECHNIQUE: Contiguous axial images were obtained from the base of the skull through the vertex without intravenous contrast. COMPARISON:  12/08/2016 head CT and prior studies FINDINGS: Brain: No evidence of acute infarction, hemorrhage, hydrocephalus, extra-axial collection or mass lesion/mass effect. Atrophy and chronic small-vessel white matter ischemic changes again noted. Vascular: Intracranial atherosclerotic calcifications again noted. Skull: RIGHT craniotomy changes again identified. No acute abnormality. Sinuses/Orbits: No acute finding. Other: None. IMPRESSION: 1. No evidence of acute intracranial abnormality. 2. Atrophy and chronic small-vessel white matter ischemic changes. Electronically Signed   By: Margarette Canada M.D.   On: 05/30/2018 16:01     ASSESSMENT AND PLAN:   Active Problems:   Encephalopathy acute   #1 acute metabolic encephalopathy: Improved, I think metabolic encephalopathy secondary to acute renal failure, improved. 2.  Shingles: Renally dose Valtrex, 3.  Acute renal failure on CKD stage IV: Improved with fluids, continue fluids, creatinine not at baseline yet. 4.  Acute encephalopathy, seen by neurology, wanted to do EEG, ultrasound of carotids, continue B12.,  Likely acute encephalopathy likely due to metabolic origin. #5 .chronic nonischemic cardiomyopathy,, continue use beta-blockers.  Patient had pacemaker so MRI could not be done.  7. history of COPD, no wheezing, continue Breo  Ellipta, Xopenex Spiriva.  8.  Anxiety, depression: Continue Xanax, BuSpar, Zoloft.  Discussed with son.  More than 50% time spent in counseling, coordination of care.  All the records are reviewed and case discussed with Care Management/Social Workerr. Management plans discussed with the patient, family and they are in agreement.  CODE STATUS: Full code  TOTAL TIME TAKING CARE OF THIS PATIENT: 35 minutes.   POSSIBLE D/C IN  1-2DAYS, DEPENDING ON CLINICAL CONDITION.   Epifanio Lesches M.D on 05/31/2018 at 2:04 PM  Between 7am to 6pm - Pager - [phone]  After 6pm go to [email or web address] - password [email or web address] [facility]  Office  [phone]  CC: Primary care physician; Tracie Harrier, MD

## 2018-05-31 NOTE — Evaluation (Signed)
Physical Therapy Evaluation Patient Details Name: Regina Burke MRN: [id number] DOB: Apr 01, 1933 Today's Date: 05/31/2018   History of Present Illness  presented to ER secondary to AMS, generalized weakness, worsened after initiation of medication for shingles; admitted for management of acute encephalopathy, dehydration.  Clinical Impression  Upon evaluation, patient alert and oriented; agreeable to session in limited capacity (OOB to [facility] only).  Bilat UE/LE strength and ROM grossly symmetrical and WFL; no focal weakness or pain reported.  Currently requiring min assist for bed mobility; min assist for sit/stand, SPT and short-distance (4 steps) with RW.  Requires UE support for all functional transfers, and standing activities.  Will continue to assess and progress mobility as appropriate. Would benefit from skilled PT to address above deficits and promote optimal return to PLOF; Recommend transition to [facility] upon discharge from acute hospitalization (though patient likely to refuse).     Follow Up Recommendations Home health PT(patient currently declining)    Equipment Recommendations  (has necessary equipment)    Recommendations for Other Services       Precautions / Restrictions Precautions Precautions: Fall Restrictions Weight Bearing Restrictions: No      Mobility  Bed Mobility Overal bed mobility: Needs Assistance Bed Mobility: Supine to Sit     Supine to sit: Min assist     General bed mobility comments: assist for truncal elevation  Transfers Overall transfer level: Needs assistance Equipment used: Rolling walker (2 wheeled) Transfers: Sit to/from Stand;Stand Pivot Transfers(Bed/BSC) Sit to Stand: Min assist         General transfer comment: cuing for hand placement, assist for lift off  Ambulation/Gait Ambulation/Gait assistance: Min assist Gait Distance (Feet): 4 Feet(x2) Assistive device: Rolling walker (2 wheeled)       General Gait Details:  forward trunk flexion; short, shuffling steps; fair stability.  Declined additional gait efforts due to fatigue  Stairs            Wheelchair Mobility    Modified Rankin (Stroke Patients Only)       Balance Overall balance assessment: Needs assistance Sitting-balance support: No upper extremity supported;Feet supported Sitting balance-Leahy Scale: Good     Standing balance support: Bilateral upper extremity supported Standing balance-Leahy Scale: Fair                               Pertinent Vitals/Pain Pain Assessment: No/denies pain    Home Living Family/patient expects to be discharged to:: Private residence Living Arrangements: Children Available Help at Discharge: Family;Available 24 hours/day Type of Home: House Home Access: Stairs to enter Entrance Stairs-Rails: None Entrance Stairs-Number of Steps: 3-single platform steps (able to negotiate with RW) Home Layout: One level Home Equipment: Walker - 2 wheels;Cane - single point;Shower seat;Grab bars - tub/shower;Walker - 4 wheels;Other (comment);Wheelchair - manual      Prior Function Level of Independence: Needs assistance               Hand Dominance        Extremity/Trunk Assessment   Upper Extremity Assessment Upper Extremity Assessment: Overall WFL for tasks assessed    Lower Extremity Assessment Lower Extremity Assessment: Overall WFL for tasks assessed(grossly 4-/5 throughout LEs only)       Communication   Communication: No difficulties  Cognition Arousal/Alertness: Awake/alert Behavior During Therapy: WFL for tasks assessed/performed Overall Cognitive Status: Within Functional Limits for tasks assessed  General Comments      Exercises     Assessment/Plan    PT Assessment Patient needs continued PT services  PT Problem List Decreased activity tolerance;Decreased balance;Decreased mobility;Pain       PT  Treatment Interventions DME instruction;Stair training;Functional mobility training;Gait training;Therapeutic activities;Therapeutic exercise;Balance training;Patient/family education    PT Goals (Current goals can be found in the Care Plan section)  Acute Rehab PT Goals Patient Stated Goal: to return home with my son PT Goal Formulation: With patient Time For Goal Achievement: 06/14/18 Potential to Achieve Goals: Good    Frequency Min 2X/week   Barriers to discharge Decreased caregiver support      Co-evaluation               AM-PAC PT "6 Clicks" Daily Activity  Outcome Measure Difficulty turning over in bed (including adjusting bedclothes, sheets and blankets)?: A Little Difficulty moving from lying on back to sitting on the side of the bed? : Unable Difficulty sitting down on and standing up from a chair with arms (e.g., wheelchair, bedside commode, etc,.)?: Unable Help needed moving to and from a bed to chair (including a wheelchair)?: A Little Help needed walking in hospital room?: A Little Help needed climbing 3-5 steps with a railing? : A Little 6 Click Score: 14    End of Session Equipment Utilized During Treatment: Gait belt Activity Tolerance: Patient tolerated treatment well Patient left: in bed;with call bell/phone within reach;with bed alarm set Nurse Communication: Mobility status PT Visit Diagnosis: Muscle weakness (generalized) (M62.81);Difficulty in walking, not elsewhere classified (R26.2)    Time: 1035-1050 PT Time Calculation (min) (ACUTE ONLY): 15 min   Charges:   PT Evaluation $PT Eval Low Complexity: 1 Low          Ghazi Rumpf H. Owens Shark, PT, DPT, NCS 05/31/18, 11:19 AM [phone]

## 2018-05-31 NOTE — Plan of Care (Signed)
Attempted to callback Daughter Kieth Brightly) at [phone] - who called to get update on mom. However, no answer and no machine to leave callback info.

## 2018-05-31 NOTE — Care Management CC44 (Signed)
Condition Code 44 Documentation Completed  Patient Details  Name: MINERVIA OSSO MRN: [id number] Date of Birth: 14-Aug-1933   Condition Code 44 given:  Yes Patient signature on Condition Code 44 notice:  Yes Documentation of 2 MD's agreement:  Yes Code 44 added to claim:  Yes    Elza Rafter, RN 05/31/2018, 5:16 PM

## 2018-05-31 NOTE — Care Management Obs Status (Signed)
[place] NOTIFICATION   Patient Details  Name: Regina Burke MRN: [id number] Date of Birth: 03/23/1933   Medicare Observation Status Notification Given:  Yes    Elza Rafter, RN 05/31/2018, 5:16 PM

## 2018-05-31 NOTE — Progress Notes (Signed)
Text paged prime MD about patient's complaints of itchiness, patient has shingles, Dr. Jannifer Franklin order for Benadryl. RN will continue to monitor.

## 2018-05-31 NOTE — Consult Note (Signed)
Reason for Consult: Altered mental status Referring Physician: Gorden Harms, MD  CC: Altered mental status  HPI: Regina Burke is an 84 y.o. female with extensive history as below presenting to the ED with altered mental status on 05/30/2018.  Patient's son state that this is new onset and abrupt change from baseline. Per son, patient was acting very confused and disoriented. She was also weak and had difficulty standing from sitting position. Patient was recently seen by her PCP on 05/28/2018 complaint of rash on right abdomen and back consistent with shingles. She was started on Valtrex 1 mg p.o. 3 times daily x7 days.  Patient describes associated symptoms of paresthesia, intermittent pain, and itchy. Denies fever, headache, or other associated neurologic deficit.  Initial lab work shows increased creatinine level from baseline 2.2-2.9 with a BUN of 70, white count of 2.7 platelets of 120.  CT head did not show acute intracranial abnormality.  Past Medical History:  Diagnosis Date  . Anemia of chronic disease   . Arthritis   . Asthma   . Cardiac resynchronization therapy pacemaker (CRT-P) in place    a. 03/31/16:  Medtronic Percepta Quad CRT-P MRI SureScan (serial Number [id number]) device.  . Chronic diastolic CHF (congestive heart failure) ([place])    a. 10/2015 Echo: EF 55-65%, Gr1 DD, mild MR, mildly dil LA, nl RV fxn, nl PASP;  b. 03/2016 Echo: EF 30-35%, mod MR, mod dil LA; c. 12/2016 Echo: EF 60-65%, no rwma, mild AS, mildly dil LA, PASP 72mmHg.  . CKD (chronic kidney disease), stage IV ([place])   . COPD (chronic obstructive pulmonary disease) ([place])   . Coronary artery disease    a. 11/2014 NSTEMI/PCI: LM nl, LAD 42m, D1 30, LCX mild dzs, OM1 20p, OM2 14m, OM3 90p (2.25x8 Promus Premier DES), RCA nl.   . Cough    CHRONIC AT NIGHT  . Cushing's disease ([place])   . Depression   . Diverticulitis   . Edema    FEET/LEGS  . GERD (gastroesophageal reflux disease)   . Gout   . History of hiatal  hernia   . History of pneumonia   . HLD (hyperlipidemia)   . HOH (hard of hearing)   . Hypertension   . Hypertensive heart disease   . Lung cancer ([place]) dx'd 2014   S/P radiation 2015  . Migraine   . Mixed Ischemic and Nonischemic Cardiomyopathy ([place])    a. 03/2016 Echo: EF 30-35%;  b. 12/2016 Echo: EF 60-65%.  . Moderate mitral regurgitation   . Multiple allergies   . Myocardial infarction ([place])   . Oxygen deficiency    2LITERS  . Persistent atrial fibrillation    a. CHADS2VASc ==> 7 (CHF, HTN, age x 2, DM, vascular disease, and gender)--was on renal dosed eliquis but this was d/c'd in 12/2016 in setting of dark stools/anemia;  c. 03/2016 s/p AVN and MDT BiV ICD placement.  . Presence of permanent cardiac pacemaker   . S/P AV nodal ablation    a. on 03/31/16 for persistant afib with CRT-P placement  . Sleep apnea   . Type II diabetes mellitus ([place])     Past Surgical History:  Procedure Laterality Date  . ABDOMINAL HYSTERECTOMY    . ABLATION     July 2017  . ADRENALECTOMY Left 1980's   "[place]"  . APPENDECTOMY    . BREAST CYST EXCISION Left   . CATARACT EXTRACTION W/PHACO Right 12/28/2015   Procedure: CATARACT EXTRACTION PHACO AND INTRAOCULAR  LENS PLACEMENT (IOC);  Surgeon: Birder Robson, MD;  Location: [facility] ORS;  Service: Ophthalmology;  Laterality: Right;  [place] 48.4   . CATARACT EXTRACTION W/PHACO Left 11/14/2016   Procedure: CATARACT EXTRACTION PHACO AND INTRAOCULAR LENS PLACEMENT (IOC);  Surgeon: Birder Robson, MD;  Location: [facility];  Service: Ophthalmology;  Laterality: Left;  [place] 59.8 AP% 18.1 CDE 10.78 Fluid pack lot # [id number]  . CHOLECYSTECTOMY    . CORONARY ANGIOPLASTY WITH STENT PLACEMENT  12/23/2014  . ELECTROPHYSIOLOGIC STUDY N/A 03/08/2016   Procedure: CARDIOVERSION;  Surgeon: Wende Bushy, MD;  Location: [facility];  Service: Cardiovascular;  Laterality: N/A;  . ELECTROPHYSIOLOGIC STUDY N/A 03/07/2016   Procedure: Cardioversion;  Surgeon: Wende Bushy, MD;   Location: [facility];  Service: Cardiovascular;  Laterality: N/A;  . ELECTROPHYSIOLOGIC STUDY N/A 03/31/2016   Procedure: AV Node Ablation;  Surgeon: Will Meredith Leeds, MD;  Location: [facility];  Service: Cardiovascular;  Laterality: N/A;  . EP IMPLANTABLE DEVICE N/A 03/31/2016   Procedure: BiV Pacemaker Insertion CRT-P;  Surgeon: Will Meredith Leeds, MD;  Location: [facility];  Service: Cardiovascular;  Laterality: N/A;  . ESOPHAGOGASTRODUODENOSCOPY (EGD) WITH PROPOFOL N/A 01/26/2017   Procedure: ESOPHAGOGASTRODUODENOSCOPY (EGD) WITH PROPOFOL;  Surgeon: [name], MD;  Location: [facility];  Service: Endoscopy;  Laterality: N/A;  . FRACTURE SURGERY    . HIP PINNING,CANNULATED Left 02/01/2017   Procedure: CANNULATED HIP PINNING;  Surgeon: Lovell Sheehan, MD;  Location: [facility];  Service: Orthopedics;  Laterality: Left;  . INSERT / REPLACE / REMOVE PACEMAKER  02/2016  . LEFT HEART CATHETERIZATION WITH CORONARY ANGIOGRAM N/A 12/23/2014   Procedure: LEFT HEART CATHETERIZATION WITH CORONARY ANGIOGRAM;  Surgeon: Burnell Blanks, MD;  Location: [facility];  Service: Cardiovascular;  Laterality: N/A;  . PERCUTANEOUS CORONARY STENT INTERVENTION (PCI-S)  12/23/2014   Procedure: PERCUTANEOUS CORONARY STENT INTERVENTION (PCI-S);  Surgeon: Burnell Blanks, MD;  Location: [facility] CATH LAB;  Service: Cardiovascular;;  Promus 2.25x8  . TONSILLECTOMY    . TRANSTHORACIC ECHOCARDIOGRAM  11/26/2015   Technically difficult study. EF 55-60%. Normal wall motion. GR 1 DD.  . TUBAL LIGATION    . WRIST FRACTURE SURGERY Bilateral ~ 2000    Family History  Problem Relation Age of Onset  . Heart disease Mother   . Diabetes Mother   . Osteoarthritis Mother   . Hypertension Mother   . Heart disease Father   . Hypertension Father   . COPD Brother     Social History:  reports that she quit smoking about 24 years ago. Her smoking use included cigarettes. She has a 45.00 pack-year smoking  history. She has never used smokeless tobacco. She reports that she does not drink alcohol or use drugs.  Allergies  Allergen Reactions  . Ciprofloxacin Shortness Of Breath, Itching and Rash  . Doxycycline Shortness Of Breath, Itching and Rash  . Penicillins Shortness Of Breath, Itching, Rash and Other (See Comments)    Has patient had a PCN reaction causing immediate rash, facial/tongue/throat swelling, SOB or lightheadedness with hypotension: Yes Has patient had a PCN reaction causing severe rash involving mucus membranes or skin necrosis: No Has patient had a PCN reaction that required hospitalization No Has patient had a PCN reaction occurring within the last 10 years: No If all of the above answers are "NO", then may proceed with Cephalosporin use.  . Sulfa Antibiotics Shortness Of Breath, Itching and Rash  . Latex Itching  . Morphine And Related Itching  . Albuterol  Sulfate   . Macrobid [Nitrofurantoin Macrocrystal] Itching  . Cefuroxime Rash    Blisters in mouth    Medications:  I have reviewed the patient's current medications. Prior to Admission:  Medications Prior to Admission  Medication Sig Dispense Refill Last Dose  . ALPRAZolam (XANAX) 0.5 MG tablet Take 0.5 mg by mouth at bedtime as needed for sleep.    05/29/2018 at PRN  . atorvastatin (LIPITOR) 20 MG tablet Take 20 mg by mouth daily.   05/29/2018 at 0800  . busPIRone (BUSPAR) 7.5 MG tablet Take 7.5 mg by mouth 2 (two) times daily.   05/29/2018 at 1800  . feeding supplement, ENSURE ENLIVE, (ENSURE ENLIVE) LIQD Take 237 mLs by mouth 3 (three) times daily with meals. 237 mL 12 Taking  . fenofibrate (TRICOR) 48 MG tablet Take 48 mg by mouth every evening.    05/29/2018 at 2000  . fluticasone furoate-vilanterol (BREO ELLIPTA) 100-25 MCG/INH AEPB Inhale 1 puff into the lungs daily as needed (shortness of breath).    05/29/2018 at PRN  . metoprolol succinate (TOPROL XL) 25 MG 24 hr tablet Take 25 mg by mouth daily.    05/29/2018  at 0800  . nitroGLYCERIN (NITROSTAT) 0.4 MG SL tablet Place 1 tablet (0.4 mg total) under the tongue every 5 (five) minutes as needed for chest pain. 25 tablet 3 Unknown at PRN  . sertraline (ZOLOFT) 50 MG tablet Take 1 tablet (50 mg total) by mouth daily. 30 tablet 2 05/29/2018 at 0800  . tiotropium (SPIRIVA) 18 MCG inhalation capsule Place 18 mcg into inhaler and inhale daily as needed (shortness of breath).    Past Week at PRN  . torsemide (DEMADEX) 20 MG tablet Take 3 tablets (60 mg total) by mouth daily. (Patient taking differently: Take 40 mg by mouth daily. ) 30 tablet 0 05/29/2018 at 0800  . traMADol (ULTRAM) 50 MG tablet Take 50 mg by mouth 2 (two) times daily as needed for moderate pain or severe pain.   3 Past Week at PRN  . valACYclovir (VALTREX) 1000 MG tablet Take 1,000 mg by mouth 3 (three) times daily.   05/29/2018 at 1800  . levalbuterol (XOPENEX) 1.25 MG/0.5ML nebulizer solution Take 1.25 mg by nebulization every 6 (six) hours as needed for wheezing or shortness of breath. (Patient not taking: Reported on 05/30/2018) 1 each 12 Not Taking at Unknown time   Scheduled: . ALPRAZolam  0.5 mg Oral QHS  . aspirin EC  81 mg Oral Daily  . atorvastatin  20 mg Oral Daily  . docusate sodium  100 mg Oral BID  . feeding supplement (ENSURE ENLIVE)  237 mL Oral TID WC  . fenofibrate  160 mg Oral Daily  . heparin  5,000 Units Subcutaneous Q8H  . metoprolol succinate  25 mg Oral Daily  . multivitamin with minerals  1 tablet Oral Daily  . pantoprazole  40 mg Oral Daily  . sertraline  50 mg Oral Daily  . torsemide  60 mg Oral Daily    ROS: History obtained from the patient   General ROS: negative for - chills, fatigue, fever, night sweats, weight gain or weight loss Psychological ROS: negative for - behavioral disorder, hallucinations, memory difficulties, mood swings or suicidal ideation.  Positive for confusion Ophthalmic ROS: negative for - blurry vision, double vision, eye pain or loss of  vision ENT ROS: negative for - epistaxis, nasal discharge, oral lesions, sore throat, tinnitus or vertigo Allergy and Immunology ROS: negative for - hives or  itchy/watery eyes Hematological and Lymphatic ROS: negative for - bleeding problems, bruising or swollen lymph nodes Endocrine ROS: negative for - galactorrhea, hair pattern changes, polydipsia/polyuria or temperature intolerance Respiratory ROS: negative for - cough, hemoptysis, shortness of breath or wheezing Cardiovascular ROS: negative for - chest pain, dyspnea on exertion, edema or irregular heartbeat Gastrointestinal ROS: negative for - abdominal pain, diarrhea, hematemesis, nausea/vomiting or stool incontinence Genito-Urinary ROS: negative for - dysuria, hematuria, incontinence or urinary frequency/urgency Musculoskeletal ROS: negative for - joint swelling or muscular weakness Neurological ROS: as noted in HPI Dermatological ROS: Positive for vesicular lesion right abdomen and back.  Physical Examination: Blood pressure (!) 156/61, pulse 72, temperature 98.2 F (36.8 C), temperature source Oral, resp. rate 18, height 5\' 4"  (1.626 m), weight 77.6 kg, SpO2 96 %.  HEENT-  Normocephalic, no lesions, without obvious abnormality.  Normal external eye and conjunctiva.  Normal TM's bilaterally.  Normal auditory canals and external ears. Normal external nose, mucus membranes and septum.  Normal pharynx. Cardiovascular- S1, S2 normal, pulses palpable throughout   Lungs- chest clear, no wheezing, rales, normal symmetric air entry Abdomen- soft, non-tender; bowel sounds normal; no masses,  no organomegaly Extremities- no edema Lymph-no adenopathy palpable Musculoskeletal-no joint tenderness, deformity or swelling Skin- vesicular lesion right abdomen and back crossing midline along dermatomal distribution.  Neurological Exam   Mental Status: Alert, oriented, thought content appropriate.  Speech fluent without evidence of aphasia.  Able to  follow 3 step commands without difficulty. Attention span and concentration seemed appropriate  Cranial Nerves: II: Discs flat bilaterally; Visual fields grossly normal, pupils equal, round, reactive to light and accommodation III,IV, VI: ptosis not present, extra-ocular motions intact bilaterally V,VII: smile symmetric, facial light touch sensation intact VIII: hearing normal bilaterally IX,X: gag reflex deferred XI: bilateral shoulder shrug XII: midline tongue extension Motor: Right :  Upper extremity   5/5 Without pronator drift      Left: Upper extremity   5/5 without pronator drift Right:   Lower extremity   5/5                                          Left: Lower extremity   5/5 Tone and bulk:normal tone throughout; no atrophy noted Sensory: Pinprick and light touch  decreased and lower extremity Deep Tendon Reflexes: 2+ and symmetric throughout Plantars: Right: mute                              Left: mute Cerebellar: Finger-to-nose testing intact bilaterally. Heel to shin testing normal bilaterally Gait: not tested due to safety concerns  Data Reviewed  Laboratory Studies:   Basic Metabolic Panel: Recent Labs  Lab 05/30/18 1308 05/31/18 0651  NA 136 140  K 3.8 3.4*  CL 89* 95*  CO2 32 33*  GLUCOSE 116* 104*  BUN 70* 55*  CREATININE 2.94* 2.34*  CALCIUM 8.9 8.5*    Liver Function Tests: Recent Labs  Lab 05/30/18 1308  AST 49*  ALT 18  ALKPHOS 53  BILITOT 1.5*  PROT 7.4  ALBUMIN 4.2   No results for input(s): LIPASE, AMYLASE in the last 168 hours. Recent Labs  Lab 05/30/18 1546  AMMONIA 21    CBC: Recent Labs  Lab 05/30/18 1308  WBC 2.7*  HGB 9.9*  HCT 28.0*  MCV 79.9*  PLT 120*  Cardiac Enzymes: No results for input(s): CKTOTAL, CKMB, CKMBINDEX, TROPONINI in the last 168 hours.  BNP: Invalid input(s): POCBNP  CBG: No results for input(s): GLUCAP in the last 168 hours.  Microbiology: Results for orders placed or performed during the  hospital encounter of 01/15/18  Urine culture     Status: None   Collection Time: 01/15/18  4:30 AM  Result Value Ref Range Status   Specimen Description   Final    URINE, CLEAN CATCH Performed at [facility], [address] [place], [place] [address]    Special Requests   Final    NONE Performed at [facility], [address] [place], [place] [address]    Culture   Final    NO GROWTH Performed at [facility], [place] [address] [place], [place] [address]    Report Status 01/17/2018 FINAL  Final  Culture, blood (routine x 2)     Status: None   Collection Time: 01/15/18 12:04 PM  Result Value Ref Range Status   Specimen Description BLOOD LEFT HAND  Final   Special Requests BOTTLES DRAWN AEROBIC AND ANAEROBIC BCAV  Final   Culture   Final    NO GROWTH 5 DAYS Performed at [facility], [address] [place], [place] [address]    Report Status 01/20/2018 FINAL  Final  Culture, blood (routine x 2)     Status: None   Collection Time: 01/15/18 12:04 PM  Result Value Ref Range Status   Specimen Description BLOOD LEFT FA  Final   Special Requests   Final    BOTTLES DRAWN AEROBIC AND ANAEROBIC Blood Culture adequate volume   Culture   Final    NO GROWTH 5 DAYS Performed at [facility], [address] [place], [place] [address]    Report Status 01/20/2018 FINAL  Final  MRSA PCR Screening     Status: None   Collection Time: 01/16/18  4:30 AM  Result Value Ref Range Status   MRSA by PCR NEGATIVE NEGATIVE Final    Comment:        The GeneXpert MRSA Assay (FDA approved for NASAL specimens only), is one component of a comprehensive MRSA colonization surveillance program. It is not intended to diagnose MRSA infection nor to guide or monitor treatment for MRSA infections. Performed at [facility], [address] [place],  [address]     Coagulation Studies: No results for input(s): LABPROT, INR in the last 72  hours.  Urinalysis:  Recent Labs  Lab 05/30/18 1849  COLORURINE YELLOW*  LABSPEC 1.011  PHURINE 6.0  GLUCOSEU NEGATIVE  HGBUR NEGATIVE  BILIRUBINUR NEGATIVE  KETONESUR NEGATIVE  PROTEINUR NEGATIVE  NITRITE NEGATIVE  LEUKOCYTESUR SMALL*    Lipid Panel:     Component Value Date/Time   CHOL 121 01/16/2018 0817   TRIG 181 (H) 01/16/2018 0817   HDL 24 (L) 01/16/2018 0817   CHOLHDL 5.0 01/16/2018 0817   VLDL 36 01/16/2018 0817   LDLCALC 61 01/16/2018 0817    HgbA1C:  Lab Results  Component Value Date   HGBA1C 5.3 01/16/2018    Urine Drug Screen:  No results found for: LABOPIA, COCAINSCRNUR, LABBENZ, AMPHETMU, THCU, LABBARB  Alcohol Level: No results for input(s): ETH in the last 168 hours.  Other results: EKG: normal EKG, normal sinus rhythm, unchanged from previous tracings, atrial fibrillation, rate 81. Vent. rate 81 BPM PR interval * ms QRS duration 125 ms QT/QTc 455/529 ms P-R-T axes * -  85 108  Imaging: Ct Head Wo Contrast  Result Date: 05/30/2018 CLINICAL DATA:  84 year old female with acute altered mental status and confusion today. EXAM: CT HEAD WITHOUT CONTRAST TECHNIQUE: Contiguous axial images were obtained from the base of the skull through the vertex without intravenous contrast. COMPARISON:  12/08/2016 head CT and prior studies FINDINGS: Brain: No evidence of acute infarction, hemorrhage, hydrocephalus, extra-axial collection or mass lesion/mass effect. Atrophy and chronic small-vessel white matter ischemic changes again noted. Vascular: Intracranial atherosclerotic calcifications again noted. Skull: RIGHT craniotomy changes again identified. No acute abnormality. Sinuses/Orbits: No acute finding. Other: None. IMPRESSION: 1. No evidence of acute intracranial abnormality. 2. Atrophy and chronic small-vessel white matter ischemic changes. Electronically Signed   By: Margarette Canada M.D.   On: 05/30/2018 16:01   Patient seen and examined.  Clinical course and  management discussed.  Necessary edits performed.  I agree with the above.  Assessment and plan of care developed and discussed below.   Assessment: 84 y.o. female with multiple medical comorbidities presenting  with altered mental status.  Patient now back to baseline.  Etiology unclear.  Differential includes seizure, TIA/infact related to afib, medication side effect, pain response and/or dehydration.  Patient has had improvement in BUN/Cr.  Valacyclovir has been discontinued.  Head CT reviewed and shows no acute changes.  MRI unable to be performed due to pacemaker.  Echo in May shows a normal EF (55-60%).  Patient's mental status improved, now appears to be back to baseline with no focality noted.  Patient on no antiplatelet or anticoagulation prior to admission.   Plan 1.  Would restart Valacyclovir.  May consider a lower dose 2.  Oral Vitamin B12 supplementation 3.  EEG 4.  Carotid dopplers 5.  A1c, lipid panel  This patient was staffed with Dr. Magda Paganini, Doy Mince who personally evaluated patient, reviewed documentation and agreed with assessment and plan of care as above.  Rufina Falco, DNP, FNP-BC Board certified Nurse Practitioner Neurology Department  05/31/2018, 8:59 AM   Alexis Goodell, MD Neurology [phone]  05/31/2018  12:43 PM

## 2018-05-31 NOTE — Care Management Obs Status (Deleted)
[facility] NOTIFICATION   Patient Details  Name: TERUKO JOSWICK MRN: [id number] Date of Birth: 08-Jun-1933   Medicare Observation Status Notification Given:  Yes    Elza Rafter, RN 05/31/2018, 5:00 PM

## 2018-05-31 NOTE — Care Management CC44 (Deleted)
Condition Code 44 Documentation Completed  Patient Details  Name: Regina Burke MRN: [id number] Date of Birth: [birth year]   Condition Code 44 given:  Yes Patient signature on Condition Code 44 notice:  Yes Documentation of 2 MD's agreement:  Yes Code 44 added to claim:  Yes    Elza Rafter, RN 05/31/2018, 5:01 PM

## 2018-06-01 DIAGNOSIS — G9340 Encephalopathy, unspecified: Secondary | ICD-10-CM | POA: Diagnosis not present

## 2018-06-01 LAB — BASIC METABOLIC PANEL
Anion gap: 12 (ref 5–15)
BUN: 52 mg/dL — AB (ref 8–23)
CALCIUM: 8.6 mg/dL — AB (ref 8.9–10.3)
CHLORIDE: 97 mmol/L — AB (ref 98–111)
CO2: 31 mmol/L (ref 22–32)
CREATININE: 1.99 mg/dL — AB (ref 0.44–1.00)
GFR calc non Af Amer: 22 mL/min — ABNORMAL LOW (ref >60)
GFR, EST AFRICAN AMERICAN: 26 mL/min — AB (ref >60)
GLUCOSE: 130 mg/dL — AB (ref 70–99)
Potassium: 4.6 mmol/L (ref 3.5–5.1)
Sodium: 140 mmol/L (ref 135–145)

## 2018-06-01 LAB — RPR: RPR Ser Ql: NONREACTIVE

## 2018-06-01 LAB — GLUCOSE, CAPILLARY: Glucose-Capillary: 113 mg/dL — ABNORMAL HIGH (ref 70–99)

## 2018-06-01 LAB — HEMOGLOBIN A1C
HEMOGLOBIN A1C: 5.2 % (ref 4.8–5.6)
Mean Plasma Glucose: 103 mg/dL

## 2018-06-01 MED ORDER — GI COCKTAIL ~~LOC~~
30.0000 mL | Freq: Once | ORAL | Status: AC
  Administered 2018-06-01: 30 mL via ORAL
  Filled 2018-06-01: qty 30

## 2018-06-01 NOTE — Progress Notes (Signed)
Spoke with dr. Tyron Russell regarding discharge. Per md patient is okay to be discharge from neurology stand point

## 2018-06-01 NOTE — Discharge Instructions (Signed)
HHPT °Fall precaution. °

## 2018-06-01 NOTE — Care Management Note (Signed)
Case Management Note  Patient Details  Name: Regina Burke MRN: [id number] Date of Birth: 1932-12-28  Subjective/Objective:                  RNCM met with patient to discuss discharge today.  She is being treated for shingles and she does not feel home health will help her.  She states the medication that PCP prescribed caused this admission.  She is on [facility] O2 with [facility]. She lives with son Regina Burke that stays with her 24/7.  She depends on him for transportation. She ambulates with a walker for stability.  She affords her medications.  Action/Plan:  No CM needs. Patient declined.  Expected Discharge Date:  06/01/18               Expected Discharge Plan:     In-House Referral:     Discharge planning Services  CM Consult  Post Acute Care Choice:  Home Health Choice offered to:  Patient  DME Arranged:    DME Agency:     HH Arranged:  Patient Refused [place] Agency:     Status of Service:  Completed, signed off  If discussed at [employer] of Stay Meetings, dates discussed:    Additional Comments:  Regina Garfinkel, RN 06/01/2018, 9:50 AM

## 2018-06-01 NOTE — Care Management (Signed)
Son [name] notified that patient has discharge. She has her portable O2 tank for discharge present in this room.Marland Kitchen

## 2018-06-01 NOTE — Discharge Summary (Signed)
[facility] [place] NAME: Regina Burke    MR#:  [id number]  DATE OF BIRTH:  01-31-1933  DATE OF ADMISSION:  05/30/2018   ADMITTING PHYSICIAN: Gorden Harms, MD  DATE OF DISCHARGE: 06/01/2018 PRIMARY CARE PHYSICIAN: Tracie Harrier, MD   ADMISSION DIAGNOSIS:  Dehydration [E86.0] Dizziness [R42] Acute renal failure superimposed on chronic kidney disease, unspecified CKD stage, unspecified acute renal failure type ([place]) [N17.9, N18.9] DISCHARGE DIAGNOSIS:  Active Problems:   Encephalopathy acute  SECONDARY DIAGNOSIS:   Past Medical History:  Diagnosis Date  . Anemia of chronic disease   . Arthritis   . Asthma   . Cardiac resynchronization therapy pacemaker (CRT-P) in place    a. 03/31/16:  Medtronic Percepta Quad CRT-P MRI SureScan (serial Number [id number]) device.  . Chronic diastolic CHF (congestive heart failure) ([place])    a. 10/2015 Echo: EF 55-65%, Gr1 DD, mild MR, mildly dil LA, nl RV fxn, nl PASP;  b. 03/2016 Echo: EF 30-35%, mod MR, mod dil LA; c. 12/2016 Echo: EF 60-65%, no rwma, mild AS, mildly dil LA, PASP 63mmHg.  . CKD (chronic kidney disease), stage IV ([place])   . COPD (chronic obstructive pulmonary disease) ([place])   . Coronary artery disease    a. 11/2014 NSTEMI/PCI: LM nl, LAD 62m, D1 30, LCX mild dzs, OM1 20p, OM2 27m, OM3 90p (2.25x8 Promus Premier DES), RCA nl.   . Cough    CHRONIC AT NIGHT  . Cushing's disease ([place])   . Depression   . Diverticulitis   . Edema    FEET/LEGS  . GERD (gastroesophageal reflux disease)   . Gout   . History of hiatal hernia   . History of pneumonia   . HLD (hyperlipidemia)   . HOH (hard of hearing)   . Hypertension   . Hypertensive heart disease   . Lung cancer ([place]) dx'd 2014   S/P radiation 2015  . Migraine   . Mixed Ischemic and Nonischemic Cardiomyopathy ([place])    a. 03/2016 Echo: EF 30-35%;  b. 12/2016 Echo: EF 60-65%.  . Moderate mitral regurgitation   . Multiple  allergies   . Myocardial infarction ([place])   . Oxygen deficiency    2LITERS  . Persistent atrial fibrillation    a. CHADS2VASc ==> 7 (CHF, HTN, 84 x 2, DM, vascular disease, and gender)--was on renal dosed eliquis but this was d/c'd in 12/2016 in setting of dark stools/anemia;  c. 03/2016 s/p AVN and MDT BiV ICD placement.  . Presence of permanent cardiac pacemaker   . S/P AV nodal ablation    a. on 03/31/16 for persistant afib with CRT-P placement  . Sleep apnea   . Type II diabetes mellitus [facility])    HOSPITAL COURSE:  *Acute encephalopathy Exact etiology unknown.  Possible due to dehydration. Improved. recently started shingles medication, continue. CT of the head is unremarkable.  Echocardiograph is normal.  Carotid duplex is unremarkable.  EEG may be performed as outpatient if symptoms recur. No further neurologic intervention is recommended at this time per Dr. Doy Mince.  *AKI-mild, CKD stage IV BL Cr 2.5 Avoid nephrotoxic agents, improved to baseline with IV fluids for rehydration, hold diuretics.  *Subacute shingles Stable Complete MAR when available, antihistamines as needed, adult pain protocol  *Chronic hypoxic respiratory failure Stable on 2 L via nasal cannula chronically  *COPD/asthma without exacerbation Stable Breathing treatments as needed  *Chronic diabetes mellitus type 2 Sliding scale insulin with Accu-Cheks per  routine  *Chronic GERD without esophagitis Stable PPI daily  *Chronic diastolic congestive heart failure without exacerbation Stable Continue Lipitor, Toprol-XL, hold torsemide PT suggest home health and PT but the patient refused. DISCHARGE CONDITIONS:  Stable, discharge home today. CONSULTS OBTAINED:  Treatment Team:  Alexis Goodell, MD DRUG ALLERGIES:   Allergies  Allergen Reactions  . Ciprofloxacin Shortness Of Breath, Itching and Rash  . Doxycycline Shortness Of Breath, Itching and Rash  . Penicillins Shortness Of Breath,  Itching, Rash and Other (See Comments)    Has patient had a PCN reaction causing immediate rash, facial/tongue/throat swelling, SOB or lightheadedness with hypotension: Yes Has patient had a PCN reaction causing severe rash involving mucus membranes or skin necrosis: No Has patient had a PCN reaction that required hospitalization No Has patient had a PCN reaction occurring within the last 10 years: No If all of the above answers are "NO", then may proceed with Cephalosporin use.  . Sulfa Antibiotics Shortness Of Breath, Itching and Rash  . Latex Itching  . Morphine And Related Itching  . Albuterol Sulfate   . Macrobid [Nitrofurantoin Macrocrystal] Itching  . Cefuroxime Rash    Blisters in mouth   DISCHARGE MEDICATIONS:   Allergies as of 06/01/2018      Reactions   Ciprofloxacin Shortness Of Breath, Itching, Rash   Doxycycline Shortness Of Breath, Itching, Rash   Penicillins Shortness Of Breath, Itching, Rash, Other (See Comments)   Has patient had a PCN reaction causing immediate rash, facial/tongue/throat swelling, SOB or lightheadedness with hypotension: Yes Has patient had a PCN reaction causing severe rash involving mucus membranes or skin necrosis: No Has patient had a PCN reaction that required hospitalization No Has patient had a PCN reaction occurring within the last 10 years: No If all of the above answers are "NO", then may proceed with Cephalosporin use.   Sulfa Antibiotics Shortness Of Breath, Itching, Rash   Latex Itching   Morphine And Related Itching   Albuterol Sulfate    Macrobid [nitrofurantoin Macrocrystal] Itching   Cefuroxime Rash   Blisters in mouth      Medication List    STOP taking these medications   torsemide 20 MG tablet Commonly known as:  DEMADEX     TAKE these medications   ALPRAZolam 0.5 MG tablet Commonly known as:  XANAX Take 0.5 mg by mouth at bedtime as needed for sleep.   atorvastatin 20 MG tablet Commonly known as:  LIPITOR Take 20  mg by mouth daily.   BREO ELLIPTA 100-25 MCG/INH Aepb Generic drug:  fluticasone furoate-vilanterol Inhale 1 puff into the lungs daily as needed (shortness of breath).   busPIRone 7.5 MG tablet Commonly known as:  BUSPAR Take 7.5 mg by mouth 2 (two) times daily.   feeding supplement (ENSURE ENLIVE) Liqd Take 237 mLs by mouth 3 (three) times daily with meals.   fenofibrate 48 MG tablet Commonly known as:  TRICOR Take 48 mg by mouth every evening.   levalbuterol 1.25 MG/0.5ML nebulizer solution Commonly known as:  XOPENEX Take 1.25 mg by nebulization every 6 (six) hours as needed for wheezing or shortness of breath.   nitroGLYCERIN 0.4 MG SL tablet Commonly known as:  NITROSTAT Place 1 tablet (0.4 mg total) under the tongue every 5 (five) minutes as needed for chest pain.   sertraline 50 MG tablet Commonly known as:  ZOLOFT Take 1 tablet (50 mg total) by mouth daily.   tiotropium 18 MCG inhalation capsule Commonly known as:  [place]  18 mcg into inhaler and inhale daily as needed (shortness of breath).   TOPROL XL 25 MG 24 hr tablet Generic drug:  metoprolol succinate Take 25 mg by mouth daily.   traMADol 50 MG tablet Commonly known as:  ULTRAM Take 50 mg by mouth 2 (two) times daily as needed for moderate pain or severe pain.   valACYclovir 1000 MG tablet Commonly known as:  VALTREX Take 1,000 mg by mouth 3 (three) times daily.        DISCHARGE INSTRUCTIONS:  See AVS. If you experience worsening of your admission symptoms, develop shortness of breath, life threatening emergency, suicidal or homicidal thoughts you must seek medical attention immediately by calling 911 or calling your MD immediately  if symptoms less severe.  You Must read complete instructions/literature along with all the possible adverse reactions/side effects for all the Medicines you take and that have been prescribed to you. Take any new Medicines after you have completely understood and  accpet all the possible adverse reactions/side effects.   Please note  You were cared for by a hospitalist during your hospital stay. If you have any questions about your discharge medications or the care you received while you were in the hospital after you are discharged, you can call the unit and asked to speak with the hospitalist on call if the hospitalist that took care of you is not available. Once you are discharged, your primary care physician will handle any further medical issues. Please note that NO REFILLS for any discharge medications will be authorized once you are discharged, as it is imperative that you return to your primary care physician (or establish a relationship with a primary care physician if you do not have one) for your aftercare needs so that they can reassess your need for medications and monitor your lab values.    On the day of Discharge:  VITAL SIGNS:  Blood pressure (!) 180/58, pulse 69, temperature 98.3 F (36.8 C), temperature source Oral, resp. rate 17, height 5\' 4"  (1.626 m), weight 77.6 kg, SpO2 99 %. PHYSICAL EXAMINATION:  GENERAL:  84 y.o.-year-old patient lying in the bed with no acute distress.  EYES: Pupils equal, round, reactive to light and accommodation. No scleral icterus. Extraocular muscles intact.  HEENT: Head atraumatic, normocephalic. Oropharynx and nasopharynx clear.  NECK:  Supple, no jugular venous distention. No thyroid enlargement, no tenderness.  LUNGS: Normal breath sounds bilaterally, no wheezing, rales,rhonchi or crepitation. No use of accessory muscles of respiration.  CARDIOVASCULAR: S1, S2 normal. No murmurs, rubs, or gallops.  ABDOMEN: Soft, non-tender, non-distended. Bowel sounds present. No organomegaly or mass.  EXTREMITIES: No pedal edema, cyanosis, or clubbing.  NEUROLOGIC: Cranial nerves II through XII are intact. Muscle strength 5/5 in all extremities. Sensation intact. Gait not checked.  PSYCHIATRIC: The patient is alert  and oriented x 3.  SKIN: No obvious rash, lesion, or ulcer.  DATA REVIEW:   CBC Recent Labs  Lab 05/30/18 1308  WBC 2.7*  HGB 9.9*  HCT 28.0*  PLT 120*    Chemistries  Recent Labs  Lab 05/30/18 1308  06/01/18 0309  NA 136   < > 140  K 3.8   < > 4.6  CL 89*   < > 97*  CO2 32   < > 31  GLUCOSE 116*   < > 130*  BUN 70*   < > 52*  CREATININE 2.94*   < > 1.99*  CALCIUM 8.9   < > 8.6*  AST  49*  --   --   ALT 18  --   --   ALKPHOS 53  --   --   BILITOT 1.5*  --   --    < > = values in this interval not displayed.     Microbiology Results  Results for orders placed or performed during the hospital encounter of 01/15/18  Urine culture     Status: None   Collection Time: 01/15/18  4:30 AM  Result Value Ref Range Status   Specimen Description   Final    URINE, CLEAN CATCH Performed at [facility], [address] [place], [place] [address]    Special Requests   Final    NONE Performed at [facility], [address] [place], [place] [address]    Culture   Final    NO GROWTH Performed at [facility], [place] [address], [place], [place] [address]    Report Status 01/17/2018 FINAL  Final  Culture, blood (routine x 2)     Status: None   Collection Time: 01/15/18 12:04 PM  Result Value Ref Range Status   Specimen Description BLOOD LEFT HAND  Final   Special Requests BOTTLES DRAWN AEROBIC AND ANAEROBIC BCAV  Final   Culture   Final    NO GROWTH 5 DAYS Performed at [facility], [address], [place], [place] [address]    Report Status 01/20/2018 FINAL  Final  Culture, blood (routine x 2)     Status: None   Collection Time: 01/15/18 12:04 PM  Result Value Ref Range Status   Specimen Description BLOOD LEFT FA  Final   Special Requests   Final    BOTTLES DRAWN AEROBIC AND ANAEROBIC Blood Culture adequate volume   Culture   Final    NO GROWTH 5 DAYS Performed at [facility], [address] [place], [place] [address]     Report Status 01/20/2018 FINAL  Final  MRSA PCR Screening     Status: None   Collection Time: 01/16/18  4:30 AM  Result Value Ref Range Status   MRSA by PCR NEGATIVE NEGATIVE Final    Comment:        The GeneXpert MRSA Assay (FDA approved for NASAL specimens only), is one component of a comprehensive MRSA colonization surveillance program. It is not intended to diagnose MRSA infection nor to guide or monitor treatment for MRSA infections. Performed at [facility], [address] [place],  [address]     RADIOLOGY:  US Carotid Bilateral  Result Date: 05/31/2018 CLINICAL DATA:  Stroke EXAM: BILATERAL CAROTID DUPLEX ULTRASOUND TECHNIQUE: Pearline Cables scale imaging, color Doppler and duplex ultrasound were performed of bilateral carotid and vertebral arteries in the neck. COMPARISON:  None. FINDINGS: Criteria: Quantification of carotid stenosis is based on velocity parameters that correlate the residual internal carotid diameter with NASCET-based stenosis levels, using the diameter of the distal internal carotid lumen as the denominator for stenosis measurement. The following velocity measurements were obtained: RIGHT ICA: 68 cm/sec CCA: 54 cm/sec SYSTOLIC ICA/CCA RATIO:  1.3 ECA: 111 cm/sec LEFT ICA: 93 cm/sec CCA: 39 cm/sec SYSTOLIC ICA/CCA RATIO:  2.4 ECA: 112 cm/sec RIGHT CAROTID ARTERY: Mild calcified plaque in the bulb. Low resistance internal carotid Doppler pattern is preserved. RIGHT VERTEBRAL ARTERY:  Antegrade. LEFT CAROTID ARTERY: Mild calcified plaque in the bulb. Low resistance internal carotid Doppler pattern is preserved. LEFT VERTEBRAL ARTERY:  Antegrade. IMPRESSION: Less than 50% stenosis in the right  and left internal carotid arteries. Electronically Signed   By: Marybelle Killings M.D.   On: 05/31/2018 15:27     Management plans discussed with the patient, family and they are in agreement.  CODE STATUS: DNR   TOTAL TIME TAKING CARE OF THIS PATIENT: 32 minutes.     Demetrios Loll M.D on 06/01/2018 at 3:12 PM  Between 7am to 6pm - Pager - [phone]  After 6pm go to [email or web address] - Proofreader  [facility]  Office  [phone]  CC: Primary care physician; Tracie Harrier, MD   Note: This dictation was prepared with Dragon dictation along with smaller phrase technology. Any transcriptional errors that result from this process are unintentional.

## 2018-06-01 NOTE — Progress Notes (Signed)
Regina Burke to be D/C'd Home per MD order. Discussed follow up appointments with the patient.  No Prescription given to patient, medication list explained in detail. Pt verbalized understanding.  Allergies as of 06/01/2018      Reactions   Ciprofloxacin Shortness Of Breath, Itching, Rash   Doxycycline Shortness Of Breath, Itching, Rash   Penicillins Shortness Of Breath, Itching, Rash, Other (See Comments)   Has patient had a PCN reaction causing immediate rash, facial/tongue/throat swelling, SOB or lightheadedness with hypotension: Yes Has patient had a PCN reaction causing severe rash involving mucus membranes or skin necrosis: No Has patient had a PCN reaction that required hospitalization No Has patient had a PCN reaction occurring within the last 10 years: No If all of the above answers are "NO", then may proceed with Cephalosporin use.   Sulfa Antibiotics Shortness Of Breath, Itching, Rash   Latex Itching   Morphine And Related Itching   Albuterol Sulfate    Macrobid [nitrofurantoin Macrocrystal] Itching   Cefuroxime Rash   Blisters in mouth      Medication List    STOP taking these medications   torsemide 20 MG tablet Commonly known as:  DEMADEX     TAKE these medications   ALPRAZolam 0.5 MG tablet Commonly known as:  XANAX Take 0.5 mg by mouth at bedtime as needed for sleep.   atorvastatin 20 MG tablet Commonly known as:  LIPITOR Take 20 mg by mouth daily.   BREO ELLIPTA 100-25 MCG/INH Aepb Generic drug:  fluticasone furoate-vilanterol Inhale 1 puff into the lungs daily as needed (shortness of breath).   busPIRone 7.5 MG tablet Commonly known as:  BUSPAR Take 7.5 mg by mouth 2 (two) times daily.   feeding supplement (ENSURE ENLIVE) Liqd Take 237 mLs by mouth 3 (three) times daily with meals.   fenofibrate 48 MG tablet Commonly known as:  TRICOR Take 48 mg by mouth every evening.   levalbuterol 1.25 MG/0.5ML nebulizer solution Commonly known as:   XOPENEX Take 1.25 mg by nebulization every 6 (six) hours as needed for wheezing or shortness of breath.   nitroGLYCERIN 0.4 MG SL tablet Commonly known as:  NITROSTAT Place 1 tablet (0.4 mg total) under the tongue every 5 (five) minutes as needed for chest pain.   sertraline 50 MG tablet Commonly known as:  ZOLOFT Take 1 tablet (50 mg total) by mouth daily.   tiotropium 18 MCG inhalation capsule Commonly known as:  SPIRIVA Place 18 mcg into inhaler and inhale daily as needed (shortness of breath).   TOPROL XL 25 MG 24 hr tablet Generic drug:  metoprolol succinate Take 25 mg by mouth daily.   traMADol 50 MG tablet Commonly known as:  ULTRAM Take 50 mg by mouth 2 (two) times daily as needed for moderate pain or severe pain.   valACYclovir 1000 MG tablet Commonly known as:  VALTREX Take 1,000 mg by mouth 3 (three) times daily.       Vitals:   06/01/18 0419 06/01/18 0815  BP: (!) 153/50 (!) 180/58  Pulse: 75 69  Resp: 17   Temp: 98.5 F (36.9 C) 98.3 F (36.8 C)  SpO2: 97% 99%    Skin clean, dry and intact without evidence of skin break down, no evidence of skin tears noted. IV catheter discontinued intact. Site without signs and symptoms of complications. Dressing and pressure applied. Pt denies pain at this time. No complaints noted.  An After Visit Summary was printed and given to the  patient. Patient escorted via [place], and D/C home via private auto.  [place]

## 2018-06-01 NOTE — Progress Notes (Signed)
Subjective: Patient awake and alert.  Has remained stable.  No worsening of mental status  Objective: Current vital signs: BP (!) 180/58 (BP Location: Right Arm) Comment: report to rn  Pulse 69   Temp 98.3 F (36.8 C) (Oral)   Resp 17   Ht 5\' 4"  (1.626 m)   Wt 77.6 kg   SpO2 99%   BMI 29.35 kg/m  Vital signs in last 24 hours: Temp:  [98.3 F (36.8 C)-99 F (37.2 C)] 98.3 F (36.8 C) (10/05 0815) Pulse Rate:  [69-75] 69 (10/05 0815) Resp:  [17] 17 (10/05 0419) BP: (151-180)/(50-58) 180/58 (10/05 0815) SpO2:  [97 %-100 %] 99 % (10/05 0815)  Intake/Output from previous day: 10/04 0701 - 10/05 0700 In: 2215.5 [I.V.:2215.5] Out: 2000 [Urine:2000] Intake/Output this shift: Total I/O In: -  Out: 200 [Urine:200] Nutritional status:  Diet Order            Diet - low sodium heart healthy        Diet heart healthy/carb modified Room service appropriate? Yes; Fluid consistency: Thin  Diet effective now              Neurologic Exam: Mental Status: Alert, oriented, thought content appropriate. Speech fluent without evidence of aphasia. Able to follow 3 step commands without difficulty. Attention span and concentration seemed appropriate  Cranial Nerves: II: Discs flat bilaterally; Visual fields grossly normal, pupils equal, round, reactive to light and accommodation III,IV, VI: ptosis not present, extra-ocular motions intact bilaterally V,VII: smile symmetric, facial light touch sensationintact VIII: hearing normal bilaterally IX,X: gag reflex deferred XI: bilateral shoulder shrug XII: midline tongue extension Motor: 5/5 throughout Sensory: Pinprick and light touch decreased and lower extremity   Lab Results: Basic Metabolic Panel: Recent Labs  Lab 05/30/18 1308 05/31/18 0651 06/01/18 0309  NA 136 140 140  K 3.8 3.4* 4.6  CL 89* 95* 97*  CO2 32 33* 31  GLUCOSE 116* 104* 130*  BUN 70* 55* 52*  CREATININE 2.94* 2.34* 1.99*  CALCIUM 8.9 8.5* 8.6*     Liver Function Tests: Recent Labs  Lab 05/30/18 1308  AST 49*  ALT 18  ALKPHOS 53  BILITOT 1.5*  PROT 7.4  ALBUMIN 4.2   No results for input(s): LIPASE, AMYLASE in the last 168 hours. Recent Labs  Lab 05/30/18 1546  AMMONIA 21    CBC: Recent Labs  Lab 05/30/18 1308  WBC 2.7*  HGB 9.9*  HCT 28.0*  MCV 79.9*  PLT 120*    Cardiac Enzymes: No results for input(s): CKTOTAL, CKMB, CKMBINDEX, TROPONINI in the last 168 hours.  Lipid Panel: No results for input(s): CHOL, TRIG, HDL, CHOLHDL, VLDL, LDLCALC in the last 168 hours.  CBG: Recent Labs  Lab 06/01/18 0812  GLUCAP 113*    Microbiology: Results for orders placed or performed during the hospital encounter of 01/15/18  Urine culture     Status: None   Collection Time: 01/15/18  4:30 AM  Result Value Ref Range Status   Specimen Description   Final    URINE, CLEAN CATCH Performed at [facility], [address] [place], [place] [address]    Special Requests   Final    NONE Performed at [facility], [address] [place], [place] [address]    Culture   Final    NO GROWTH Performed at [facility], [place] [address], [place], [place] [address]    Report Status 01/17/2018 FINAL  Final  Culture, blood (routine  x 2)     Status: None   Collection Time: 01/15/18 12:04 PM  Result Value Ref Range Status   Specimen Description BLOOD LEFT HAND  Final   Special Requests BOTTLES DRAWN AEROBIC AND ANAEROBIC BCAV  Final   Culture   Final    NO GROWTH 5 DAYS Performed at [facility], [address] [place], [place] [address]    Report Status 01/20/2018 FINAL  Final  Culture, blood (routine x 2)     Status: None   Collection Time: 01/15/18 12:04 PM  Result Value Ref Range Status   Specimen Description BLOOD LEFT FA  Final   Special Requests   Final    BOTTLES DRAWN AEROBIC AND ANAEROBIC Blood Culture adequate volume   Culture   Final    NO GROWTH 5 DAYS Performed at  [facility], [address] [place], [place] [address]    Report Status 01/20/2018 FINAL  Final  MRSA PCR Screening     Status: None   Collection Time: 01/16/18  4:30 AM  Result Value Ref Range Status   MRSA by PCR NEGATIVE NEGATIVE Final    Comment:        The GeneXpert MRSA Assay (FDA approved for NASAL specimens only), is one component of a comprehensive MRSA colonization surveillance program. It is not intended to diagnose MRSA infection nor to guide or monitor treatment for MRSA infections. Performed at [facility], [address] [place], [place] [address]     Coagulation Studies: No results for input(s): LABPROT, INR in the last 72 hours.  Imaging: Ct Head Wo Contrast  Result Date: 05/30/2018 CLINICAL DATA:  84 year old female with acute altered mental status and confusion today. EXAM: CT HEAD WITHOUT CONTRAST TECHNIQUE: Contiguous axial images were obtained from the base of the skull through the vertex without intravenous contrast. COMPARISON:  12/08/2016 head CT and prior studies FINDINGS: Brain: No evidence of acute infarction, hemorrhage, hydrocephalus, extra-axial collection or mass lesion/mass effect. Atrophy and chronic small-vessel white matter ischemic changes again noted. Vascular: Intracranial atherosclerotic calcifications again noted. Skull: RIGHT craniotomy changes again identified. No acute abnormality. Sinuses/Orbits: No acute finding. Other: None. IMPRESSION: 1. No evidence of acute intracranial abnormality. 2. Atrophy and chronic small-vessel white matter ischemic changes. Electronically Signed   By: Margarette Canada M.D.   On: 05/30/2018 16:01   US Carotid Bilateral  Result Date: 05/31/2018 CLINICAL DATA:  Stroke EXAM: BILATERAL CAROTID DUPLEX ULTRASOUND TECHNIQUE: Pearline Cables scale imaging, color Doppler and duplex ultrasound were performed of bilateral carotid and vertebral arteries in the neck. COMPARISON:  None. FINDINGS: Criteria:  Quantification of carotid stenosis is based on velocity parameters that correlate the residual internal carotid diameter with NASCET-based stenosis levels, using the diameter of the distal internal carotid lumen as the denominator for stenosis measurement. The following velocity measurements were obtained: RIGHT ICA: 68 cm/sec CCA: 54 cm/sec SYSTOLIC ICA/CCA RATIO:  1.3 ECA: 111 cm/sec LEFT ICA: 93 cm/sec CCA: 39 cm/sec SYSTOLIC ICA/CCA RATIO:  2.4 ECA: 112 cm/sec RIGHT CAROTID ARTERY: Mild calcified plaque in the bulb. Low resistance internal carotid Doppler pattern is preserved. RIGHT VERTEBRAL ARTERY:  Antegrade. LEFT CAROTID ARTERY: Mild calcified plaque in the bulb. Low resistance internal carotid Doppler pattern is preserved. LEFT VERTEBRAL ARTERY:  Antegrade. IMPRESSION: Less than 50% stenosis in the right and left internal carotid arteries. Electronically Signed   By: Marybelle Killings M.D.   On: 05/31/2018 15:27    Medications:  I have reviewed the patient's  current medications. Scheduled: . ALPRAZolam  0.5 mg Oral QHS  . aspirin EC  81 mg Oral Daily  . atorvastatin  20 mg Oral Daily  . busPIRone  7.5 mg Oral BID  . docusate sodium  100 mg Oral BID  . feeding supplement (ENSURE ENLIVE)  237 mL Oral TID WC  . fenofibrate  160 mg Oral Daily  . heparin  5,000 Units Subcutaneous Q8H  . metoprolol succinate  25 mg Oral Daily  . multivitamin with minerals  1 tablet Oral Daily  . pantoprazole  40 mg Oral Daily  . sertraline  50 mg Oral Daily  . vitamin B-12  1,000 mcg Oral Daily    Assessment/Plan: Patient continues to do well.  A1c 5.2.  Carotid dopplers show no evidence of hemodynamically significant stenosis.  EEG may be performed as an outpatient if symptoms recur.    No further neurologic intervention is recommended at this time.  If further questions arise, please call or page at that time.  Thank you for allowing neurology to participate in the care of this patient.    LOS: 1 day    Alexis Goodell, MD Neurology [phone] 06/01/2018  10:03 AM

## 2018-06-04 ENCOUNTER — Inpatient Hospital Stay: Payer: Medicare Other

## 2018-06-04 ENCOUNTER — Inpatient Hospital Stay: Payer: Medicare Other | Attending: Oncology

## 2018-06-26 ENCOUNTER — Ambulatory Visit
Admission: RE | Admit: 2018-06-26 | Discharge: 2018-06-26 | Disposition: A | Discharge status: Home / Self Care | Payer: Medicare Other | Source: Ambulatory Visit | Attending: Family Medicine | Admitting: Family Medicine

## 2018-06-26 DIAGNOSIS — R9389 Abnormal findings on diagnostic imaging of other specified body structures: Secondary | ICD-10-CM | POA: Insufficient documentation

## 2018-06-26 DIAGNOSIS — J90 Pleural effusion, not elsewhere classified: Secondary | ICD-10-CM | POA: Diagnosis not present

## 2018-06-26 DIAGNOSIS — Z923 Personal history of irradiation: Secondary | ICD-10-CM | POA: Insufficient documentation

## 2018-06-26 DIAGNOSIS — I700 Atherosclerosis of aorta: Secondary | ICD-10-CM | POA: Insufficient documentation

## 2018-06-28 ENCOUNTER — Ambulatory Visit: Payer: Medicare Other | Admitting: Family

## 2018-06-30 NOTE — Progress Notes (Signed)
[place]  Telephone:([phone]) [phone] Fax:(336) [phone]  ID: Regina Burke OB: June 03, 1933  MR#: [id number]  TMH#:[id number]  Patient Care Team: Tracie Harrier, MD as PCP - General (Internal Medicine) Minna Merritts, MD as Consulting Physician (Cardiology) Alisa Graff, FNP as Nurse Practitioner (Family Medicine) Laverle Hobby, MD as Consulting Physician (Pulmonary Disease) Lavonia Dana, MD as Consulting Physician (Internal Medicine)  CHIEF COMPLAINT: Anemia secondary to chronic renal failure, clinical stage IA adenocarcinoma of right upper lobe lung.  INTERVAL HISTORY: Patient returns to clinic today for routine six-month follow-up.  She was in the hospital approximately 1 month ago with encephalopathy and acute renal failure.  More recently she has also been treated for shingles.  She currently feels well and is approximately her baseline. She continues to have multiple medical complaints that are chronic and unchanged.  She continues to have chronic weakness and fatigue. She continues to have shortness of breath and requires oxygen 24 hours/day. She has no new neurologic complaints. She denies any recent fevers.  She denies any chest pain or cough.  She has no nausea, vomiting, constipation, or diarrhea. She has no urinary complaints.  Patient offers no further specific complaints today.    REVIEW OF SYSTEMS:   Review of Systems  Constitutional: Positive for malaise/fatigue. Negative for fever and weight loss.  Respiratory: Positive for shortness of breath. Negative for cough and hemoptysis.   Cardiovascular: Positive for leg swelling. Negative for chest pain.  Gastrointestinal: Negative.  Negative for abdominal pain, blood in stool, diarrhea and melena.  Genitourinary: Negative.  Negative for hematuria.  Musculoskeletal: Positive for back pain.  Skin: Positive for rash.  Neurological: Positive for dizziness and weakness. Negative for focal  weakness.  Psychiatric/Behavioral: Negative.  The patient is not nervous/anxious.     As per HPI. Otherwise, a complete review of systems is negative.  PAST MEDICAL HISTORY: Past Medical History:  Diagnosis Date  . Anemia of chronic disease   . Arthritis   . Asthma   . Cardiac resynchronization therapy pacemaker (CRT-P) in place    a. 03/31/16:  Medtronic Percepta Quad CRT-P MRI SureScan (serial Number [id number]) device.  . Chronic diastolic CHF (congestive heart failure) ([place])    a. 10/2015 Echo: EF 55-65%, Gr1 DD, mild MR, mildly dil LA, nl RV fxn, nl PASP;  b. 03/2016 Echo: EF 30-35%, mod MR, mod dil LA; c. 12/2016 Echo: EF 60-65%, no rwma, mild AS, mildly dil LA, PASP 81mmHg.  . CKD (chronic kidney disease), stage IV ([place])   . COPD (chronic obstructive pulmonary disease) ([place])   . Coronary artery disease    a. 11/2014 NSTEMI/PCI: LM nl, LAD 51m, D1 30, LCX mild dzs, OM1 20p, OM2 5m, OM3 90p (2.25x8 Promus Premier DES), RCA nl.   . Cough    CHRONIC AT NIGHT  . Cushing's disease ([place])   . Depression   . Diverticulitis   . Edema    FEET/LEGS  . GERD (gastroesophageal reflux disease)   . Gout   . History of hiatal hernia   . History of pneumonia   . HLD (hyperlipidemia)   . HOH (hard of hearing)   . Hypertension   . Hypertensive heart disease   . Lung cancer ([place]) dx'd 2014   S/P radiation 2015  . Migraine   . Mixed Ischemic and Nonischemic Cardiomyopathy ([place])    a. 03/2016 Echo: EF 30-35%;  b. 12/2016 Echo: EF 60-65%.  . Moderate mitral regurgitation   . Multiple  allergies   . Myocardial infarction ([place])   . Oxygen deficiency    2LITERS  . Persistent atrial fibrillation    a. CHADS2VASc ==> 7 (CHF, HTN, age x 2, DM, vascular disease, and gender)--was on renal dosed eliquis but this was d/c'd in 12/2016 in setting of dark stools/anemia;  c. 03/2016 s/p AVN and MDT BiV ICD placement.  . Presence of permanent cardiac pacemaker   . S/P AV nodal ablation    a. on 03/31/16 for  persistant afib with CRT-P placement  . Sleep apnea   . Type II diabetes mellitus ([place])     PAST SURGICAL HISTORY: Past Surgical History:  Procedure Laterality Date  . ABDOMINAL HYSTERECTOMY    . ABLATION     July 2017  . ADRENALECTOMY Left 1980's   "[place]"  . APPENDECTOMY    . BREAST CYST EXCISION Left   . CATARACT EXTRACTION W/PHACO Right 12/28/2015   Procedure: CATARACT EXTRACTION PHACO AND INTRAOCULAR LENS PLACEMENT (IOC);  Surgeon: Birder Robson, MD;  Location: [facility] ORS;  Service: Ophthalmology;  Laterality: Right;  [place] 48.4   . CATARACT EXTRACTION W/PHACO Left 11/14/2016   Procedure: CATARACT EXTRACTION PHACO AND INTRAOCULAR LENS PLACEMENT (IOC);  Surgeon: Birder Robson, MD;  Location: [facility] ORS;  Service: Ophthalmology;  Laterality: Left;  [place] 59.8 AP% 18.1 CDE 10.78 Fluid pack lot # [id number]  . CHOLECYSTECTOMY    . CORONARY ANGIOPLASTY WITH STENT PLACEMENT  12/23/2014  . ELECTROPHYSIOLOGIC STUDY N/A 03/08/2016   Procedure: CARDIOVERSION;  Surgeon: Wende Bushy, MD;  Location: [facility] OR[facility];  Service: Cardiovascular;  Laterality: N/A;  . ELECTROPHYSIOLOGIC STUDY N/A 03/07/2016   Procedure: Cardioversion;  Surgeon: Wende Bushy, MD;  Location: [facility] OR[facility];  Service: Cardiovascular;  Laterality: N/A;  . ELECTROPHYSIOLOGIC STUDY N/A 03/31/2016   Procedure: AV Node Ablation;  Surgeon: Will Meredith Leeds, MD;  Location: [place] [facility];  Service: Cardiovascular;  Laterality: N/A;  . EP IMPLANTABLE DEVICE N/A 03/31/2016   Procedure: BiV Pacemaker Insertion CRT-P;  Surgeon: Will Meredith Leeds, MD;  Location: [place] [facility];  Service: Cardiovascular;  Laterality: N/A;  . ESOPHAGOGASTRODUODENOSCOPY (EGD) WITH PROPOFOL N/A 01/26/2017   Procedure: ESOPHAGOGASTRODUODENOSCOPY (EGD) WITH PROPOFOL;  Surgeon: [name], MD;  Location: [facility] ENDOSCOPY;  Service: Endoscopy;  Laterality: N/A;  . FRACTURE SURGERY    . HIP PINNING,CANNULATED Left 02/01/2017   Procedure: CANNULATED HIP PINNING;   Surgeon: Lovell Sheehan, MD;  Location: [facility];  Service: Orthopedics;  Laterality: Left;  . INSERT / REPLACE / REMOVE PACEMAKER  02/2016  . LEFT HEART CATHETERIZATION WITH CORONARY ANGIOGRAM N/A 12/23/2014   Procedure: LEFT HEART CATHETERIZATION WITH CORONARY ANGIOGRAM;  Surgeon: Burnell Blanks, MD;  Location: [facility] C[facility] LAB;  Service: Cardiovascular;  Laterality: N/A;  . PERCUTANEOUS CORONARY STENT INTERVENTION (PCI-S)  12/23/2014   Procedure: PERCUTANEOUS CORONARY STENT INTERVENTION (PCI-S);  Surgeon: Burnell Blanks, MD;  Location: [facility] CATH LAB;  Service: Cardiovascular;;  Promus 2.25x8  . TONSILLECTOMY    . TRANSTHORACIC ECHOCARDIOGRAM  11/26/2015   Technically difficult study. EF 55-60%. Normal wall motion. GR 1 DD.  . TUBAL LIGATION    . WRIST FRACTURE SURGERY Bilateral ~ 2000    FAMILY HISTORY: Family History  Problem Relation Age of Onset  . Heart disease Mother   . Diabetes Mother   . Osteoarthritis Mother   . Hypertension Mother   . Heart disease Father   . Hypertension Father   . COPD Brother     ADVANCED DIRECTIVES (Y/N):  N  HEALTH MAINTENANCE: Social History   Tobacco Use  . Smoking status: Former Smoker    Packs/day: 1.00    Years: 45.00    Pack years: 45.00    Types: Cigarettes    Last attempt to quit: 04/25/1994    Years since quitting: 24.2  . Smokeless tobacco: Never Used  Substance Use Topics  . Alcohol use: No    Comment: 12/23/2014 "might have a couple mixed drinks/year"  . Drug use: No     Colonoscopy:  PAP:  Bone density:  Lipid panel:  Allergies  Allergen Reactions  . Ciprofloxacin Shortness Of Breath, Itching and Rash  . Doxycycline Shortness Of Breath, Itching and Rash  . Penicillins Shortness Of Breath, Itching, Rash and Other (See Comments)    Has patient had a PCN reaction causing immediate rash, facial/tongue/throat swelling, SOB or lightheadedness with hypotension: Yes Has patient had a PCN reaction causing severe rash  involving mucus membranes or skin necrosis: No Has patient had a PCN reaction that required hospitalization No Has patient had a PCN reaction occurring within the last 10 years: No If all of the above answers are "NO", then may proceed with Cephalosporin use.  . Sulfa Antibiotics Shortness Of Breath, Itching and Rash  . Latex Itching  . Morphine And Related Itching  . Albuterol Sulfate   . Macrobid [Nitrofurantoin Macrocrystal] Itching  . Cefuroxime Rash    Blisters in mouth    Current Outpatient Medications  Medication Sig Dispense Refill  . Aclidinium Bromide 400 MCG/ACT AEPB Inhale into the lungs.    . ALPRAZolam (XANAX) 0.5 MG tablet Take 0.5 mg by mouth at bedtime as needed for sleep.     Marland Kitchen atorvastatin (LIPITOR) 20 MG tablet Take 20 mg by mouth daily.    . busPIRone (BUSPAR) 7.5 MG tablet Take 7.5 mg by mouth 2 (two) times daily.    . feeding supplement, ENSURE ENLIVE, (ENSURE ENLIVE) LIQD Take 237 mLs by mouth 3 (three) times daily with meals. 237 mL 12  . fenofibrate (TRICOR) 48 MG tablet Take 48 mg by mouth every evening.     . fluticasone furoate-vilanterol (BREO ELLIPTA) 100-25 MCG/INH AEPB Inhale 1 puff into the lungs daily as needed (shortness of breath).     . metoprolol succinate (TOPROL XL) 25 MG 24 hr tablet Take 25 mg by mouth daily.     . sertraline (ZOLOFT) 50 MG tablet Take 1 tablet (50 mg total) by mouth daily. 30 tablet 2  . spironolactone (ALDACTONE) 25 MG tablet Take 25 mg by mouth daily.  3  . tiotropium (SPIRIVA) 18 MCG inhalation capsule Place 18 mcg into inhaler and inhale daily as needed (shortness of breath).     . torsemide (DEMADEX) 20 MG tablet Take 40 mg by mouth daily.  3  . traMADol (ULTRAM) 50 MG tablet Take 50 mg by mouth 2 (two) times daily as needed for moderate pain or severe pain.   3  . levalbuterol (XOPENEX) 1.25 MG/0.5ML nebulizer solution Take 1.25 mg by nebulization every 6 (six) hours as needed for wheezing or shortness of breath. (Patient  not taking: Reported on 05/30/2018) 1 each 12  . nitroGLYCERIN (NITROSTAT) 0.4 MG SL tablet Place 1 tablet (0.4 mg total) under the tongue every 5 (five) minutes as needed for chest pain. (Patient not taking: Reported on 07/02/2018) 25 tablet 3   No current facility-administered medications for this visit.     OBJECTIVE: Vitals:   07/02/18 1320  BP: (!) 146/73  Pulse: 73  Resp: 20  Temp: (!) 94.9 F (34.9 C)     Body mass index is 29.95 kg/m.    ECOG FS:2 - Symptomatic, <50% confined to bed  General: Well-developed, well-nourished, no acute distress.  Sitting in a wheelchair. Eyes: Pink conjunctiva, anicteric sclera. HEENT: Normocephalic, moist mucous membranes, clear oropharnyx. Lungs: Diminished breath sounds bilaterally. Heart: Regular rate and rhythm. No rubs, murmurs, or gallops. Abdomen: Soft, nontender, nondistended. No organomegaly noted, normoactive bowel sounds. Musculoskeletal: Bilateral lower extremity edema. Neuro: Alert, answering all questions appropriately. Cranial nerves grossly intact. Skin: No rashes or petechiae noted. Psych: Normal affect.  LAB RESULTS:  Lab Results  Component Value Date   NA 140 06/01/2018   K 4.6 06/01/2018   CL 97 (L) 06/01/2018   CO2 31 06/01/2018   GLUCOSE 130 (H) 06/01/2018   BUN 52 (H) 06/01/2018   CREATININE 1.99 (H) 06/01/2018   CALCIUM 8.6 (L) 06/01/2018   PROT 7.4 05/30/2018   ALBUMIN 4.2 05/30/2018   AST 49 (H) 05/30/2018   ALT 18 05/30/2018   ALKPHOS 53 05/30/2018   BILITOT 1.5 (H) 05/30/2018   GFRNONAA 22 (L) 06/01/2018   GFRAA 26 (L) 06/01/2018    Lab Results  Component Value Date   WBC 4.7 07/02/2018   NEUTROABS 3.9 07/02/2018   HGB 9.0 (L) 07/02/2018   HCT 29.0 (L) 07/02/2018   MCV 85.3 07/02/2018   PLT 168 07/02/2018   Lab Results  Component Value Date   IRON 47 01/20/2018   TIBC 322 01/20/2018   IRONPCTSAT 15 01/20/2018   Lab Results  Component Value Date   FERRITIN 156 01/20/2018      STUDIES: Ct Chest Wo Contrast  Result Date: 06/27/2018 CLINICAL DATA:  History of lung cancer EXAM: CT CHEST WITHOUT CONTRAST TECHNIQUE: Multidetector CT imaging of the chest was performed following the standard protocol without IV contrast. COMPARISON:  03/07/2016 FINDINGS: Cardiovascular: Advanced atherosclerotic calcifications of the ascending aorta, aortic arch, descending thoracic aorta, great vessels, and coronary arteries are noted. Left subclavian triple lead pacemaker device is in place. No evidence of ascending aortic aneurysm. Mediastinum/Nodes: Small scattered mediastinal nodes are noted. Soft tissue prominence in the right hilum is stable and likely related to prominent pulmonary arterial vasculature. No pericardial effusion. Lungs/Pleura: Small left pleural effusion is improved. There is no right pleural effusion. The previous right pleural effusion has resolved. Dense parenchymal opacity extending from the right hilum into the central right upper lobe and right apex is less prominent compared to the prior study. There is some filling of the distal airways. There are also some metallic objects likely related to postoperative changes. Scattered scarring in the lungs peripherally is stable. There are no new spiculated nodules. There is also some thickening of the left major fissure which may be related to loculated pleural fluid. Upper Abdomen: Low-density lesions in the spleen with calcifications are stable. Right adrenal nodule is stable. Musculoskeletal: Superior endplate fractures of T8 and T10 are stable. IMPRESSION: Compared to the prior chest CT dated 2017, there has been some regression of the right upper lobe findings. The abnormal parenchymal opacity in the right upper lobe is less prominent than likely related to radiation therapy from prior malignancy. Right pleural effusion has resolved. Left pleural effusion is improved. Chronic findings in the spleen and right adrenal gland are  stable supporting benign etiology. Aortic Atherosclerosis (ICD10-I70.0). Electronically Signed   By: Marybelle Killings M.D.   On: 06/27/2018 08:09    ASSESSMENT: Anemia  secondary to chronic renal failure, clinical stage IA adenocarcinoma of right upper lobe lung.  PLAN:    1. Anemia secondary to chronic renal failure: Patient's hemoglobin is 9.0, therefore will proceed with 40,000 units Procrit today. Her most recent iron stores on Jan 20, 2018 were reported within normal limits.  She last received Procrit in July 2019.  She has requested less frequent follow-up, therefore will return to clinic in 2 in 4 months for consideration of Procrit if her hemoglobin is below 10.0.  She will then return to clinic in 6 months for further evaluation and continuation of Procrit if necessary.   2. Clinical stage IA adenocarcinoma of right upper lobe lung: Patient is status post stereotactic radiation therapy.  CT scan results from June 26, 2018 reviewed independently and report as above with no evidence of recurrent or progressive disease.  Given her multiple comorbidities, additional treatment would be difficult if patient had a recurrence.  Therefore is recommended that no further imaging be done on a routine basis. 3.  Chronic renal insufficiency: Patient's creatinine is slightly worse at 1.99.  Continue follow-up with nephrology as indicated.     Patient expressed understanding and was in agreement with this plan. She also understands that She can call clinic at any time with any questions, concerns, or complaints.   Cancer Staging Cancer of upper lobe of right lung [facility]) Staging form: Lung, AJCC 7th Edition - Clinical stage from 10/08/2016: Stage IA (T1a, N0, M0) - Signed by Lloyd Huger, MD on 10/08/2016   Lloyd Huger, MD   07/02/2018 2:12 PM

## 2018-07-02 ENCOUNTER — Inpatient Hospital Stay: Payer: Medicare Other

## 2018-07-02 ENCOUNTER — Inpatient Hospital Stay: Payer: Medicare Other | Attending: Oncology

## 2018-07-02 ENCOUNTER — Inpatient Hospital Stay (HOSPITAL_BASED_OUTPATIENT_CLINIC_OR_DEPARTMENT_OTHER): Payer: Medicare Other | Admitting: Oncology

## 2018-07-02 ENCOUNTER — Other Ambulatory Visit: Payer: Self-pay

## 2018-07-02 VITALS — BP 146/73 | HR 73 | Temp 94.9°F | Resp 20 | Wt 174.5 lb

## 2018-07-02 DIAGNOSIS — D631 Anemia in chronic kidney disease: Secondary | ICD-10-CM | POA: Diagnosis not present

## 2018-07-02 DIAGNOSIS — Z85118 Personal history of other malignant neoplasm of bronchus and lung: Secondary | ICD-10-CM | POA: Diagnosis not present

## 2018-07-02 DIAGNOSIS — C3411 Malignant neoplasm of upper lobe, right bronchus or lung: Secondary | ICD-10-CM

## 2018-07-02 DIAGNOSIS — N189 Chronic kidney disease, unspecified: Secondary | ICD-10-CM

## 2018-07-02 DIAGNOSIS — N184 Chronic kidney disease, stage 4 (severe): Secondary | ICD-10-CM | POA: Diagnosis present

## 2018-07-02 LAB — IRON AND TIBC
Iron: 50 ug/dL (ref 28–170)
SATURATION RATIOS: 12 % (ref 10.4–31.8)
TIBC: 410 ug/dL (ref 250–450)
UIBC: 360 ug/dL

## 2018-07-02 LAB — CBC WITH DIFFERENTIAL/PLATELET
Abs Immature Granulocytes: 0.01 10*3/uL (ref 0.00–0.07)
BASOS ABS: 0 10*3/uL (ref 0.0–0.1)
Basophils Relative: 0 %
EOS ABS: 0.1 10*3/uL (ref 0.0–0.5)
EOS PCT: 2 %
HCT: 29 % — ABNORMAL LOW (ref 36.0–46.0)
HEMOGLOBIN: 9 g/dL — AB (ref 12.0–15.0)
Immature Granulocytes: 0 %
LYMPHS PCT: 10 %
Lymphs Abs: 0.5 10*3/uL — ABNORMAL LOW (ref 0.7–4.0)
MCH: 26.5 pg (ref 26.0–34.0)
MCHC: 31 g/dL (ref 30.0–36.0)
MCV: 85.3 fL (ref 80.0–100.0)
Monocytes Absolute: 0.3 10*3/uL (ref 0.1–1.0)
Monocytes Relative: 5 %
Neutro Abs: 3.9 10*3/uL (ref 1.7–7.7)
Neutrophils Relative %: 83 %
PLATELETS: 168 10*3/uL (ref 150–400)
RBC: 3.4 MIL/uL — AB (ref 3.87–5.11)
RDW: 15.3 % (ref 11.5–15.5)
WBC: 4.7 10*3/uL (ref 4.0–10.5)
nRBC: 0 % (ref 0.0–0.2)

## 2018-07-02 LAB — FERRITIN: Ferritin: 69 ng/mL (ref 11–307)

## 2018-07-02 MED ORDER — EPOETIN ALFA 40000 UNIT/ML IJ SOLN
40000.0000 [IU] | Freq: Once | INTRAMUSCULAR | Status: AC
  Administered 2018-07-02: 40000 [IU] via SUBCUTANEOUS
  Filled 2018-07-02: qty 1

## 2018-07-02 NOTE — Progress Notes (Signed)
Here for follow up. On 02@ 2 L 24/7  Pulse ox 90-92 %  Pt stated today she has a headache-level 6 . No energy . No nausea today -per son " she eats alka seltzer like candy  "per pt feels better after taking it. Anxious to hear CT results per pt

## 2018-07-30 ENCOUNTER — Ambulatory Visit (INDEPENDENT_AMBULATORY_CARE_PROVIDER_SITE_OTHER): Payer: Medicare Other

## 2018-07-30 ENCOUNTER — Inpatient Hospital Stay: Payer: Medicare Other

## 2018-07-30 DIAGNOSIS — I255 Ischemic cardiomyopathy: Secondary | ICD-10-CM | POA: Diagnosis not present

## 2018-07-30 NOTE — Progress Notes (Signed)
Remote pacemaker transmission.   

## 2018-08-28 LAB — CUP PACEART REMOTE DEVICE CHECK
Battery Remaining Longevity: 60 mo
Battery Voltage: 2.97 V
Brady Statistic AP VP Percent: 0 %
Brady Statistic AS VS Percent: 0.01 %
Brady Statistic RV Percent Paced: 99.99 %
Date Time Interrogation Session: 20191203043121
Implantable Lead Implant Date: 20170804
Implantable Lead Location: 753858
Implantable Lead Location: 753860
Implantable Lead Model: 4298
Lead Channel Impedance Value: 418 Ohm
Lead Channel Impedance Value: 532 Ohm
Lead Channel Impedance Value: 646 Ohm
Lead Channel Impedance Value: 684 Ohm
Lead Channel Impedance Value: 855 Ohm
Lead Channel Pacing Threshold Amplitude: 2.5 V
Lead Channel Pacing Threshold Pulse Width: 0.4 ms
Lead Channel Sensing Intrinsic Amplitude: 18.125 mV
Lead Channel Setting Pacing Amplitude: 2.5 V
Lead Channel Setting Pacing Amplitude: 2.5 V
Lead Channel Setting Pacing Pulse Width: 0.4 ms
Lead Channel Setting Pacing Pulse Width: 1.5 ms
Lead Channel Setting Sensing Sensitivity: 4 mV
MDC IDC LEAD IMPLANT DT: 20170804
MDC IDC MSMT LEADCHNL LV IMPEDANCE VALUE: 1026 Ohm
MDC IDC MSMT LEADCHNL LV IMPEDANCE VALUE: 323 Ohm
MDC IDC MSMT LEADCHNL LV IMPEDANCE VALUE: 456 Ohm
MDC IDC MSMT LEADCHNL LV IMPEDANCE VALUE: 494 Ohm
MDC IDC MSMT LEADCHNL LV IMPEDANCE VALUE: 665 Ohm
MDC IDC MSMT LEADCHNL LV IMPEDANCE VALUE: 988 Ohm
MDC IDC MSMT LEADCHNL LV PACING THRESHOLD PULSEWIDTH: 1 ms
MDC IDC MSMT LEADCHNL RA IMPEDANCE VALUE: 3344 Ohm
MDC IDC MSMT LEADCHNL RA IMPEDANCE VALUE: 3344 Ohm
MDC IDC MSMT LEADCHNL RV IMPEDANCE VALUE: 513 Ohm
MDC IDC MSMT LEADCHNL RV PACING THRESHOLD AMPLITUDE: 0.75 V
MDC IDC PG IMPLANT DT: 20170804
MDC IDC STAT BRADY AP VS PERCENT: 0 %
MDC IDC STAT BRADY AS VP PERCENT: 99.99 %
MDC IDC STAT BRADY RA PERCENT PACED: 0 %

## 2018-09-03 ENCOUNTER — Inpatient Hospital Stay: Payer: Medicare Other

## 2018-09-03 ENCOUNTER — Inpatient Hospital Stay: Payer: Medicare Other | Attending: Oncology

## 2018-09-03 DIAGNOSIS — N184 Chronic kidney disease, stage 4 (severe): Secondary | ICD-10-CM | POA: Diagnosis present

## 2018-09-03 DIAGNOSIS — D631 Anemia in chronic kidney disease: Secondary | ICD-10-CM | POA: Insufficient documentation

## 2018-09-03 DIAGNOSIS — Z85118 Personal history of other malignant neoplasm of bronchus and lung: Secondary | ICD-10-CM | POA: Insufficient documentation

## 2018-09-03 DIAGNOSIS — N189 Chronic kidney disease, unspecified: Secondary | ICD-10-CM

## 2018-09-03 LAB — HEMOGLOBIN: Hemoglobin: 10.1 g/dL — ABNORMAL LOW (ref 12.0–15.0)

## 2018-10-28 ENCOUNTER — Emergency Department: Payer: Medicare Other

## 2018-10-28 ENCOUNTER — Encounter: Payer: Self-pay | Admitting: Emergency Medicine

## 2018-10-28 ENCOUNTER — Other Ambulatory Visit: Payer: Self-pay

## 2018-10-28 ENCOUNTER — Emergency Department
Admission: EM | Admit: 2018-10-28 | Discharge: 2018-10-28 | Disposition: A | Discharge status: Home / Self Care | Payer: Medicare Other | Attending: Emergency Medicine | Admitting: Emergency Medicine

## 2018-10-28 ENCOUNTER — Telehealth: Payer: Self-pay | Admitting: *Deleted

## 2018-10-28 DIAGNOSIS — Z87891 Personal history of nicotine dependence: Secondary | ICD-10-CM | POA: Diagnosis not present

## 2018-10-28 DIAGNOSIS — M79604 Pain in right leg: Secondary | ICD-10-CM | POA: Diagnosis present

## 2018-10-28 DIAGNOSIS — E876 Hypokalemia: Secondary | ICD-10-CM

## 2018-10-28 DIAGNOSIS — Z79899 Other long term (current) drug therapy: Secondary | ICD-10-CM | POA: Insufficient documentation

## 2018-10-28 DIAGNOSIS — I259 Chronic ischemic heart disease, unspecified: Secondary | ICD-10-CM | POA: Diagnosis not present

## 2018-10-28 DIAGNOSIS — Z91040 Latex allergy status: Secondary | ICD-10-CM | POA: Insufficient documentation

## 2018-10-28 DIAGNOSIS — R109 Unspecified abdominal pain: Secondary | ICD-10-CM | POA: Insufficient documentation

## 2018-10-28 DIAGNOSIS — E1122 Type 2 diabetes mellitus with diabetic chronic kidney disease: Secondary | ICD-10-CM | POA: Diagnosis not present

## 2018-10-28 DIAGNOSIS — I130 Hypertensive heart and chronic kidney disease with heart failure and stage 1 through stage 4 chronic kidney disease, or unspecified chronic kidney disease: Secondary | ICD-10-CM | POA: Insufficient documentation

## 2018-10-28 DIAGNOSIS — I5032 Chronic diastolic (congestive) heart failure: Secondary | ICD-10-CM | POA: Insufficient documentation

## 2018-10-28 DIAGNOSIS — N189 Chronic kidney disease, unspecified: Secondary | ICD-10-CM | POA: Insufficient documentation

## 2018-10-28 DIAGNOSIS — J449 Chronic obstructive pulmonary disease, unspecified: Secondary | ICD-10-CM | POA: Diagnosis not present

## 2018-10-28 DIAGNOSIS — M10061 Idiopathic gout, right knee: Secondary | ICD-10-CM | POA: Insufficient documentation

## 2018-10-28 LAB — CBC WITH DIFFERENTIAL/PLATELET
Abs Immature Granulocytes: 0.05 10*3/uL (ref 0.00–0.07)
BASOS ABS: 0.1 10*3/uL (ref 0.0–0.1)
BASOS PCT: 1 %
EOS ABS: 0 10*3/uL (ref 0.0–0.5)
Eosinophils Relative: 0 %
HEMATOCRIT: 35.3 % — AB (ref 36.0–46.0)
Hemoglobin: 10.8 g/dL — ABNORMAL LOW (ref 12.0–15.0)
IMMATURE GRANULOCYTES: 1 %
Lymphocytes Relative: 3 %
Lymphs Abs: 0.2 10*3/uL — ABNORMAL LOW (ref 0.7–4.0)
MCH: 24.4 pg — AB (ref 26.0–34.0)
MCHC: 30.6 g/dL (ref 30.0–36.0)
MCV: 79.7 fL — AB (ref 80.0–100.0)
MONOS PCT: 6 %
Monocytes Absolute: 0.6 10*3/uL (ref 0.1–1.0)
NEUTROS ABS: 8.4 10*3/uL — AB (ref 1.7–7.7)
NEUTROS PCT: 89 %
NRBC: 0 % (ref 0.0–0.2)
PLATELETS: 232 10*3/uL (ref 150–400)
RBC: 4.43 MIL/uL (ref 3.87–5.11)
RDW: 15.4 % (ref 11.5–15.5)
WBC: 9.3 10*3/uL (ref 4.0–10.5)

## 2018-10-28 LAB — COMPREHENSIVE METABOLIC PANEL
ALT: 11 U/L (ref 0–44)
ANION GAP: 16 — AB (ref 5–15)
AST: 28 U/L (ref 15–41)
Albumin: 3.6 g/dL (ref 3.5–5.0)
Alkaline Phosphatase: 51 U/L (ref 38–126)
BILIRUBIN TOTAL: 2.8 mg/dL — AB (ref 0.3–1.2)
BUN: 38 mg/dL — AB (ref 8–23)
CHLORIDE: 82 mmol/L — AB (ref 98–111)
CO2: 39 mmol/L — ABNORMAL HIGH (ref 22–32)
Calcium: 8.9 mg/dL (ref 8.9–10.3)
Creatinine, Ser: 1.26 mg/dL — ABNORMAL HIGH (ref 0.44–1.00)
GFR, EST AFRICAN AMERICAN: 46 mL/min — AB (ref >60)
GFR, EST NON AFRICAN AMERICAN: 39 mL/min — AB (ref >60)
Glucose, Bld: 125 mg/dL — ABNORMAL HIGH (ref 70–99)
POTASSIUM: 2.7 mmol/L — AB (ref 3.5–5.1)
Sodium: 137 mmol/L (ref 135–145)
TOTAL PROTEIN: 7.3 g/dL (ref 6.5–8.1)

## 2018-10-28 LAB — INFLUENZA PANEL BY PCR (TYPE A & B)
INFLBPCR: NEGATIVE
Influenza A By PCR: NEGATIVE

## 2018-10-28 LAB — URIC ACID: Uric Acid, Serum: 12.6 mg/dL — ABNORMAL HIGH (ref 2.5–7.1)

## 2018-10-28 LAB — LIPASE, BLOOD: LIPASE: 21 U/L (ref 11–51)

## 2018-10-28 MED ORDER — SODIUM CHLORIDE 0.9 % IV BOLUS
500.0000 mL | Freq: Once | INTRAVENOUS | Status: AC
  Administered 2018-10-28: 500 mL via INTRAVENOUS

## 2018-10-28 MED ORDER — PREDNISONE 10 MG PO TABS
10.0000 mg | ORAL_TABLET | ORAL | Status: AC
  Administered 2018-10-28: 10 mg via ORAL
  Filled 2018-10-28: qty 1

## 2018-10-28 MED ORDER — COLCHICINE 0.6 MG PO TABS
0.6000 mg | ORAL_TABLET | Freq: Once | ORAL | Status: AC
  Administered 2018-10-28: 0.6 mg via ORAL
  Filled 2018-10-28: qty 1

## 2018-10-28 MED ORDER — POTASSIUM CHLORIDE 20 MEQ PO PACK
40.0000 meq | PACK | Freq: Once | ORAL | Status: AC
  Administered 2018-10-28: 40 meq via ORAL
  Filled 2018-10-28: qty 2

## 2018-10-28 MED ORDER — PREDNISONE 10 MG PO TABS: 10.0000 mg | ORAL_TABLET | Freq: Every day | ORAL | 0 refills | Status: AC

## 2018-10-28 NOTE — ED Provider Notes (Signed)
[facility] Emergency Department Provider Note  ____________________________________________  Time seen: Approximately 1:24 PM  I have reviewed the triage vital signs and the nursing notes.   HISTORY  Chief Complaint Leg Pain    HPI Regina Burke is a 85 y.o. female with a history of CKD COPD CAD hypokalemia and gout who complains of  right knee pain for the past few days.  She also has chronic left leg pain and chronic right flank pain after shingles a month ago.  Denies chest pain shortness of breath cough fevers or chills.  No falls or other trauma.  Knee pain is been gradual onset, constant, severe, nonradiating, worse with weightbearing, no alleviating factors.  Feels like gout that she had in the past.     Past Medical History:  Diagnosis Date  . Anemia of chronic disease   . Arthritis   . Asthma   . Cardiac resynchronization therapy pacemaker (CRT-P) in place    a. 03/31/16:  Medtronic Percepta Quad CRT-P MRI SureScan (serial Number [id number]) device.  . Chronic diastolic CHF (congestive heart failure) ([place])    a. 10/2015 Echo: EF 55-65%, Gr1 DD, mild MR, mildly dil LA, nl RV fxn, nl PASP;  b. 03/2016 Echo: EF 30-35%, mod MR, mod dil LA; c. 12/2016 Echo: EF 60-65%, no rwma, mild AS, mildly dil LA, PASP 9mmHg.  . CKD (chronic kidney disease), stage IV ([place])   . COPD (chronic obstructive pulmonary disease) ([place])   . Coronary artery disease    a. 11/2014 NSTEMI/PCI: LM nl, LAD 48m, D1 30, LCX mild dzs, OM1 20p, OM2 36m, OM3 90p (2.25x8 Promus Premier DES), RCA nl.   . Cough    CHRONIC AT NIGHT  . Cushing's disease ([place])   . Depression   . Diverticulitis   . Edema    FEET/LEGS  . GERD (gastroesophageal reflux disease)   . Gout   . History of hiatal hernia   . History of pneumonia   . HLD (hyperlipidemia)   . HOH (hard of hearing)   . Hypertension   . Hypertensive heart disease   . Lung cancer ([place]) dx'd 2014   S/P radiation 2015  . Migraine    . Mixed Ischemic and Nonischemic Cardiomyopathy ([place])    a. 03/2016 Echo: EF 30-35%;  b. 12/2016 Echo: EF 60-65%.  . Moderate mitral regurgitation   . Multiple allergies   . Myocardial infarction ([place])   . Oxygen deficiency    2LITERS  . Persistent atrial fibrillation    a. CHADS2VASc ==> 7 (CHF, HTN, age x 2, DM, vascular disease, and gender)--was on renal dosed eliquis but this was d/c'd in 12/2016 in setting of dark stools/anemia;  c. 03/2016 s/p AVN and MDT BiV ICD placement.  . Presence of permanent cardiac pacemaker   . S/P AV nodal ablation    a. on 03/31/16 for persistant afib with CRT-P placement  . Sleep apnea   . Type II diabetes mellitus [facility])      Patient Active Problem List   Diagnosis Date Noted  . Encephalopathy acute 05/30/2018  . Acute on chronic heart failure ([place])   . Goals of care, counseling/discussion   . Advance care planning   . S/p left hip fracture 01/31/2017  . Pre-operative cardiovascular examination 01/31/2017  . Other diseases of stomach and duodenum   . Dizziness 08/25/2016  . LBBB (left bundle branch block) 04/01/2016  . S/P AV nodal ablation   . Cardiac resynchronization therapy  pacemaker (CRT-P) in place   . Persistent atrial fibrillation ([place]): CHA2DS2-Vasc = ~7. On Xarelto 15 mg (age & renal Fxn) 03/18/2016  . Congestive dilated cardiomyopathy ([place]) 03/18/2016    Class: Temporary  . DNR (do not resuscitate) discussion   . Palliative care encounter   . Persistent atrial fibrillation   . Chronic diastolic CHF (congestive heart failure) ([place]) 02/16/2016  . HLD (hyperlipidemia) 02/16/2016  . Iron deficiency anemia due to chronic blood loss   . Essential hypertension   . Diaphoresis   . Renal insufficiency   . Chronic obstructive pulmonary disease ([place]) 07/14/2015  . Status post thoracentesis   . S/P thoracentesis   . H/O: lung cancer   . Pleural effusion   . Coronary artery disease involving native coronary artery of native heart without  angina pectoris   . S/P coronary artery stent placement   . Pressure ulcer 06/08/2015  . Dehydration 05/09/2015  . Diabetes ([place]) 05/09/2015  . Closed fracture nasal bone 05/09/2015  . Cancer of upper lobe of right lung ([place]) 01/08/2015  . Allergic state 01/08/2015  . Anemia associated with chronic renal failure 01/08/2015  . Chronic kidney disease 01/08/2015  . CAFL (chronic airflow limitation) ([place]) 01/08/2015  . Arthritis, degenerative 01/08/2015  . 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 Diabetes mellitus, type 2 ([place]) 09/11/2014  . Type 2 diabetes mellitus ([place]) 09/11/2014     Past Surgical History:  Procedure Laterality Date  . ABDOMINAL HYSTERECTOMY    . ABLATION     July 2017  . ADRENALECTOMY Left 1980's   "[place]"  . APPENDECTOMY    . BREAST CYST EXCISION Left   . CATARACT EXTRACTION W/PHACO Right 12/28/2015   Procedure: CATARACT EXTRACTION PHACO AND INTRAOCULAR LENS PLACEMENT (IOC);  Surgeon: Birder Robson, MD;  Location: [facility] ORS;  Service: Ophthalmology;  Laterality: Right;  [place] 48.4   . CATARACT EXTRACTION W/PHACO Left 11/14/2016   Procedure: CATARACT EXTRACTION PHACO AND INTRAOCULAR LENS PLACEMENT (IOC);  Surgeon: Birder Robson, MD;  Location: [facility] ORS;  Service: Ophthalmology;  Laterality: Left;  [place] 59.8 AP% 18.1 CDE 10.78 Fluid pack lot # [id number]  . CHOLECYSTECTOMY    . CORONARY ANGIOPLASTY WITH STENT PLACEMENT  12/23/2014  . ELECTROPHYSIOLOGIC STUDY N/A 03/08/2016   Procedure: CARDIOVERSION;  Surgeon: Wende Bushy, MD;  Location: [facility] ORS;  Service: Cardiovascular;  Laterality: N/A;  . ELECTROPHYSIOLOGIC STUDY N/A 03/07/2016   Procedure: Cardioversion;  Surgeon: Wende Bushy, MD;  Location: [facility] ORS;  Service: Cardiovascular;  Laterality: N/A;  . ELECTROPHYSIOLOGIC STUDY N/A 03/31/2016   Procedure: AV Node Ablation;  Surgeon: Will Meredith Leeds, MD;  Location: [place] CV LAB;  Service: Cardiovascular;  Laterality: N/A;  . EP IMPLANTABLE DEVICE N/A  03/31/2016   Procedure: BiV Pacemaker Insertion CRT-P;  Surgeon: Will Meredith Leeds, MD;  Location: [place] CV LAB;  Service: Cardiovascular;  Laterality: N/A;  . ESOPHAGOGASTRODUODENOSCOPY (EGD) WITH PROPOFOL N/A 01/26/2017   Procedure: ESOPHAGOGASTRODUODENOSCOPY (EGD) WITH PROPOFOL;  Surgeon: [name], MD;  Location: [facility] ENDOSCOPY;  Service: Endoscopy;  Laterality: N/A;  . FRACTURE SURGERY    . HIP PINNING,CANNULATED Left 02/01/2017   Procedure: CANNULATED HIP PINNING;  Surgeon: Lovell Sheehan, MD;  Location: [facility] ORS;  Service: Orthopedics;  Laterality: Left;  . INSERT / REPLACE / REMOVE PACEMAKER  02/2016  . LEFT HEART CATHETERIZATION WITH CORONARY ANGIOGRAM N/A 12/23/2014   Procedure: LEFT HEART CATHETERIZATION WITH CORONARY ANGIOGRAM;  Surgeon: Burnell Blanks, MD;  Location: [facility] CATH LAB;  Service: Cardiovascular;  Laterality:  N/A;  . PERCUTANEOUS CORONARY STENT INTERVENTION (PCI-S)  12/23/2014   Procedure: PERCUTANEOUS CORONARY STENT INTERVENTION (PCI-S);  Surgeon: Burnell Blanks, MD;  Location: [facility] CATH LAB;  Service: Cardiovascular;;  Promus 2.25x8  . TONSILLECTOMY    . TRANSTHORACIC ECHOCARDIOGRAM  11/26/2015   Technically difficult study. EF 55-60%. Normal wall motion. GR 1 DD.  . TUBAL LIGATION    . WRIST FRACTURE SURGERY Bilateral ~ 2000     Prior to Admission medications   Medication Sig Start Date End Date Taking? Authorizing Provider  Aclidinium Bromide 400 MCG/ACT AEPB Inhale into the lungs. 06/13/18 12/10/18  [provider]  ALPRAZolam Duanne Moron) 0.5 MG tablet Take 0.5 mg by mouth at bedtime as needed for sleep.  12/27/16   [provider]  atorvastatin (LIPITOR) 20 MG tablet Take 20 mg by mouth daily.    [provider]  busPIRone (BUSPAR) 7.5 MG tablet Take 7.5 mg by mouth 2 (two) times daily.    [provider]  feeding supplement, ENSURE ENLIVE, (ENSURE ENLIVE) LIQD Take 237 mLs by mouth 3 (three) times daily with meals.  03/02/17   Fritzi Mandes, MD  fenofibrate (TRICOR) 48 MG tablet Take 48 mg by mouth every evening.     [provider]  fluticasone furoate-vilanterol (BREO ELLIPTA) 100-25 MCG/INH AEPB Inhale 1 puff into the lungs daily as needed (shortness of breath).     [provider]  levalbuterol (XOPENEX) 1.25 MG/0.5ML nebulizer solution Take 1.25 mg by nebulization every 6 (six) hours as needed for wheezing or shortness of breath. Patient not taking: Reported on 05/30/2018 03/02/17   Fritzi Mandes, MD  metoprolol succinate (TOPROL XL) 25 MG 24 hr tablet Take 25 mg by mouth daily.  12/19/16   Minna Merritts, MD  nitroGLYCERIN (NITROSTAT) 0.4 MG SL tablet Place 1 tablet (0.4 mg total) under the tongue every 5 (five) minutes as needed for chest pain. Patient not taking: Reported on 07/02/2018 12/24/14   Barrett, Evelene Croon, PA-C  sertraline (ZOLOFT) 50 MG tablet Take 1 tablet (50 mg total) by mouth daily. 03/24/16   Gladstone Lighter, MD  spironolactone (ALDACTONE) 25 MG tablet Take 25 mg by mouth daily. 06/20/18   [provider]  tiotropium (SPIRIVA) 18 MCG inhalation capsule Place 18 mcg into inhaler and inhale daily as needed (shortness of breath).     [provider]  torsemide (DEMADEX) 20 MG tablet Take 40 mg by mouth daily. 06/27/18   [provider]  traMADol (ULTRAM) 50 MG tablet Take 50 mg by mouth 2 (two) times daily as needed for moderate pain or severe pain.  03/06/18   [provider]     Allergies Ciprofloxacin; Doxycycline; Penicillins; Sulfa antibiotics; Latex; Morphine and related; Albuterol sulfate; Macrobid [nitrofurantoin macrocrystal]; and Cefuroxime   Family History  Problem Relation Age of Onset  . Heart disease Mother   . Diabetes Mother   . Osteoarthritis Mother   . Hypertension Mother   . Heart disease Father   . Hypertension Father   . COPD Brother     Social History Social History   Tobacco Use  . Smoking status: Former  Smoker    Packs/day: 1.00    Years: 45.00    Pack years: 45.00    Types: Cigarettes    Last attempt to quit: 04/25/1994    Years since quitting: 24.5  . Smokeless tobacco: Never Used  Substance Use Topics  . Alcohol use: No    Comment: 12/23/2014 "might have  a couple mixed drinks/year"  . Drug use: No    Review of Systems  Constitutional:   No fever or chills.  ENT:   No sore throat. No rhinorrhea. Cardiovascular:   No chest pain or syncope. Respiratory:   No dyspnea or cough. Gastrointestinal:   Negative for abdominal pain, vomiting and diarrhea.  Musculoskeletal:   Right knee pain as above All other systems reviewed and are negative except as documented above in ROS and HPI.  ____________________________________________   PHYSICAL EXAM:  VITAL SIGNS: ED Triage Vitals  Enc Vitals Group     BP 10/28/18 1016 (!) 159/65     Pulse --      Resp 10/28/18 1016 16     Temp 10/28/18 1016 99.3 F (37.4 C)     Temp Source 10/28/18 1016 Oral     SpO2 10/28/18 1016 93 %     Weight 10/28/18 1015 174 lb 9.7 oz (79.2 kg)     Height --      Head Circumference --      Peak Flow --      Pain Score 10/28/18 1147 0     Pain Loc --      Pain Edu? --      Excl. in [place]? --     Vital signs reviewed, nursing assessments reviewed.   Constitutional:   Alert and oriented. Non-toxic appearance. Eyes:   Conjunctivae are normal. EOMI. PERRL. ENT      Head:   Normocephalic and atraumatic.      Nose:   No congestion/rhinnorhea.       Mouth/Throat:   MMM, no pharyngeal erythema. No peritonsillar mass.       Neck:   No meningismus. Full ROM. Hematological/Lymphatic/Immunilogical:   No cervical lymphadenopathy. Cardiovascular:   RRR. Symmetric bilateral radial and DP pulses.  No murmurs. Cap refill less than 2 seconds. Respiratory:   Normal respiratory effort without tachypnea/retractions. Breath sounds are clear and equal bilaterally. No wheezes/rales/rhonchi. Gastrointestinal:   Soft with  mild right-sided tenderness. Non distended. There is no CVA tenderness.  No rebound, rigidity, or guarding. Musculoskeletal:   Pain limited range of motion in the right knee.  There is a large right joint effusion in the knee.  No inflammatory changes.  No focal bony tenderness. Neurologic:   Normal speech and language.  Motor grossly intact. No acute focal neurologic deficits are appreciated.  Skin:    Skin is warm, dry and intact. No rash noted.  No petechiae, purpura, or bullae.  Sequelae of previous zoster rash over the right flank and upper abdomen with hyperpigmented crops, without active vesicles or lesions or inflammation  ____________________________________________    LABS (pertinent positives/negatives) (all labs ordered are listed, but only abnormal results are displayed) Labs Reviewed  COMPREHENSIVE METABOLIC PANEL - Abnormal; Notable for the following components:      Result Value   Potassium 2.7 (*)    Chloride 82 (*)    CO2 39 (*)    Glucose, Bld 125 (*)    BUN 38 (*)    Creatinine, Ser 1.26 (*)    Total Bilirubin 2.8 (*)    GFR calc non Af Amer 39 (*)    GFR calc Af Amer 46 (*)    Anion gap 16 (*)    All other components within normal limits  CBC WITH DIFFERENTIAL/PLATELET - Abnormal; Notable for the following components:   Hemoglobin 10.8 (*)    HCT 35.3 (*)    MCV 79.7 (*)  MCH 24.4 (*)    Neutro Abs 8.4 (*)    Lymphs Abs 0.2 (*)    All other components within normal limits  URIC ACID - Abnormal; Notable for the following components:   Uric Acid, Serum 12.6 (*)    All other components within normal limits  LIPASE, BLOOD  INFLUENZA PANEL BY PCR (TYPE A & B)   ____________________________________________   EKG    ____________________________________________    RADIOLOGY  Ct Abdomen Pelvis Wo Contrast  Result Date: 10/28/2018 CLINICAL DATA:  Abdominal pain EXAM: CT ABDOMEN AND PELVIS WITHOUT CONTRAST TECHNIQUE: Multidetector CT imaging of the  abdomen and pelvis was performed following the standard protocol without IV contrast. COMPARISON:  01/29/2017 FINDINGS: Lower chest: cardiomegaly. Pacer wires noted in the right heart and coronary sinus. Small left pleural effusion, similar to prior study. No right effusion or confluent opacity. Hepatobiliary: No focal liver abnormality is seen. Status post cholecystectomy. No biliary dilatation. Pancreas: No focal abnormality or ductal dilatation. Spleen: Multiple low-density masses within the spleen are stable since prior study. Spleen is enlarged with a craniocaudal length of 16.6 cm. Adrenals/Urinary Tract: Multiple hypodense and hyperdense lesions within the kidneys, likely a mixture of simple and hemorrhagic cysts. These are stable. No hydronephrosis. Right adrenal lesion measures 3 cm, stable since prior study. Urinary bladder unremarkable. Stomach/Bowel: Sigmoid diverticulosis. No active diverticulitis. Stomach and small bowel decompressed, unremarkable. Vascular/Lymphatic: Heavily calcified aorta and iliac vessels. No aneurysm or adenopathy. Reproductive: Prior hysterectomy.  No adnexal masses. Other: No free fluid or free air. Musculoskeletal: No acute bony abnormality. Hardware within the left hip related to remote trauma. IMPRESSION: Cardiomegaly.  Small left pleural effusion. Splenomegaly with stable numerous low-density lesions throughout the spleen, presumably benign. Right adrenal nodule again noted, stable compatible with adenoma. Bilateral simple and her hemorrhagic cysts in the kidneys. No hydronephrosis. Aortic atherosclerosis. Sigmoid diverticulosis. No acute findings. Electronically Signed   By: Rolm Baptise M.D.   On: 10/28/2018 10:51   Dg Chest 2 View  Result Date: 10/28/2018 CLINICAL DATA:  Hypoxia EXAM: CHEST - 2 VIEW COMPARISON:  06/26/2018 FINDINGS: Fiducial markings are again noted in the right upper lobe with linear density radiating peripheral stable since the previous CT exam.  Cardiac shadow is enlarged. Aortic calcifications are noted. Pacing device is seen. Chronic left pleural effusion is noted. No acute infiltrate is seen. IMPRESSION: Chronic changes without acute abnormality. Electronically Signed   By: Inez Catalina M.D.   On: 10/28/2018 11:08   Dg Knee Complete 4 Views Right  Result Date: 10/28/2018 CLINICAL DATA:  Right knee pain and swelling, initial encounter EXAM: RIGHT KNEE - COMPLETE 4+ VIEW COMPARISON:  None. FINDINGS: Large right pleural effusion is noted. No definitive fracture is seen. Degenerative changes are noted with mild osteophytic changes. Irregularity of the posterior aspect of the patella is noted as well likely degenerative in nature. IMPRESSION: Degenerative change with large joint effusion Electronically Signed   By: Inez Catalina M.D.   On: 10/28/2018 11:10    ____________________________________________   PROCEDURES Procedures  ____________________________________________  DIFFERENTIAL DIAGNOSIS   Cholecystitis, pancreatitis, appendicitis, diverticulitis, constipation, right knee fracture, gout, septic arthritis  CLINICAL IMPRESSION / ASSESSMENT AND PLAN / ED COURSE  Medications ordered in the ED: Medications  sodium chloride 0.9 % bolus 500 mL (0 mLs Intravenous Stopped 10/28/18 1146)  colchicine tablet 0.6 mg (0.6 mg Oral Given 10/28/18 1324)  potassium chloride (KLOR-CON) packet 40 mEq (40 mEq Oral Given 10/28/18 1214)  predniSONE (DELTASONE) tablet  10 mg (10 mg Oral Given 10/28/18 1327)    Pertinent labs & imaging results that were available during my care of the patient were reviewed by me and considered in my medical decision making (see chart for details).    Patient presents with right knee pain which is her acute issue.  On exam also she has notable right-sided abdominal tenderness without a clear explanation.  Given her age and comorbidities, CT scan of the abdomen was obtained which was unremarkable.  X-ray of the right knee  also unremarkable.  Her white blood cell count is normal and her uric acid level is high at 12, confirming that the right knee effusion is a recurrent gout flare.  She previously was on 10 mg prednisone daily x6 days for gout which she reports was efficacious.  I will repeat this today.  Also get a single dose of colchicine in the ED.  Her creatinine is improved compared to what appears to be her chronic baseline.  Plan d/w patient and son at bedside who agree.      ____________________________________________   FINAL CLINICAL IMPRESSION(S) / ED DIAGNOSES    Final diagnoses:  Acute idiopathic gout of right knee  Hypokalemia     ED Discharge Orders    None      Portions of this note were generated with dragon dictation software. Dictation errors may occur despite best attempts at proofreading.   Carrie Mew, MD 10/28/18 [phone]

## 2018-10-28 NOTE — ED Triage Notes (Signed)
From home.  Arrives via EMS with c/o leg pain bilaterally.  Also has shingles and c/o pain to site of shingles x 1 month.  4 mg zofran given by EMS.

## 2018-10-28 NOTE — ED Notes (Signed)
Attempted to call son to return to hospital to pick patient up.  Voice Mail to pick patient up.

## 2018-10-28 NOTE — ED Notes (Signed)
AAOx3.  Skin warm and dry.  NAD 

## 2018-10-28 NOTE — Telephone Encounter (Signed)
Patient son Romero Liner called and stated that his mother had her labs drawn at her  kidney docter and he said that her number were ok and to CAN. the 10/29/18 appts

## 2018-10-29 ENCOUNTER — Inpatient Hospital Stay: Payer: Medicare Other

## 2018-10-29 ENCOUNTER — Ambulatory Visit (INDEPENDENT_AMBULATORY_CARE_PROVIDER_SITE_OTHER): Payer: Medicare Other | Admitting: *Deleted

## 2018-10-29 DIAGNOSIS — I4819 Other persistent atrial fibrillation: Secondary | ICD-10-CM

## 2018-10-29 DIAGNOSIS — I5032 Chronic diastolic (congestive) heart failure: Secondary | ICD-10-CM | POA: Diagnosis not present

## 2018-10-30 LAB — CUP PACEART REMOTE DEVICE CHECK
Battery Remaining Longevity: 58 mo
Brady Statistic AS VP Percent: 99.99 %
Brady Statistic RA Percent Paced: 0 %
Implantable Lead Implant Date: 20170804
Implantable Lead Location: 753858
Implantable Lead Model: 5076
Implantable Pulse Generator Implant Date: 20170804
Lead Channel Impedance Value: 323 Ohm
Lead Channel Impedance Value: 3344 Ohm
Lead Channel Impedance Value: 3344 Ohm
Lead Channel Impedance Value: 399 Ohm
Lead Channel Impedance Value: 437 Ohm
Lead Channel Impedance Value: 456 Ohm
Lead Channel Impedance Value: 513 Ohm
Lead Channel Impedance Value: 608 Ohm
Lead Channel Impedance Value: 684 Ohm
Lead Channel Impedance Value: 798 Ohm
Lead Channel Impedance Value: 817 Ohm
Lead Channel Pacing Threshold Pulse Width: 1 ms
Lead Channel Setting Pacing Amplitude: 2.5 V
Lead Channel Setting Pacing Amplitude: 2.5 V
Lead Channel Setting Pacing Pulse Width: 0.4 ms
Lead Channel Setting Pacing Pulse Width: 1.5 ms
MDC IDC LEAD IMPLANT DT: 20170804
MDC IDC LEAD LOCATION: 753860
MDC IDC MSMT BATTERY VOLTAGE: 2.97 V
MDC IDC MSMT LEADCHNL LV IMPEDANCE VALUE: 475 Ohm
MDC IDC MSMT LEADCHNL LV IMPEDANCE VALUE: 513 Ohm
MDC IDC MSMT LEADCHNL LV IMPEDANCE VALUE: 646 Ohm
MDC IDC MSMT LEADCHNL LV PACING THRESHOLD AMPLITUDE: 2.5 V
MDC IDC MSMT LEADCHNL RV PACING THRESHOLD AMPLITUDE: 0.75 V
MDC IDC MSMT LEADCHNL RV PACING THRESHOLD PULSEWIDTH: 0.4 ms
MDC IDC MSMT LEADCHNL RV SENSING INTR AMPL: 18.125 mV
MDC IDC SESS DTM: 20200303043533
MDC IDC SET LEADCHNL RV SENSING SENSITIVITY: 4 mV
MDC IDC STAT BRADY AP VP PERCENT: 0 %
MDC IDC STAT BRADY AP VS PERCENT: 0 %
MDC IDC STAT BRADY AS VS PERCENT: 0.01 %
MDC IDC STAT BRADY RV PERCENT PACED: 99.99 %

## 2018-11-05 NOTE — Progress Notes (Signed)
Remote pacemaker transmission.   

## 2018-12-23 ENCOUNTER — Other Ambulatory Visit: Payer: Self-pay

## 2018-12-23 ENCOUNTER — Encounter: Payer: Self-pay | Admitting: Emergency Medicine

## 2018-12-23 ENCOUNTER — Emergency Department: Payer: Medicare Other

## 2018-12-23 ENCOUNTER — Inpatient Hospital Stay
Admission: EM | Admit: 2018-12-23 | Discharge: 2019-01-27 | DRG: 291 | Disposition: E | Payer: Medicare Other | Attending: Internal Medicine | Admitting: Internal Medicine

## 2018-12-23 DIAGNOSIS — I4821 Permanent atrial fibrillation: Secondary | ICD-10-CM | POA: Diagnosis present

## 2018-12-23 DIAGNOSIS — Z515 Encounter for palliative care: Secondary | ICD-10-CM | POA: Diagnosis not present

## 2018-12-23 DIAGNOSIS — Z79899 Other long term (current) drug therapy: Secondary | ICD-10-CM

## 2018-12-23 DIAGNOSIS — R0603 Acute respiratory distress: Secondary | ICD-10-CM | POA: Diagnosis not present

## 2018-12-23 DIAGNOSIS — I25119 Atherosclerotic heart disease of native coronary artery with unspecified angina pectoris: Secondary | ICD-10-CM | POA: Diagnosis not present

## 2018-12-23 DIAGNOSIS — E870 Hyperosmolality and hypernatremia: Secondary | ICD-10-CM | POA: Diagnosis not present

## 2018-12-23 DIAGNOSIS — Z8249 Family history of ischemic heart disease and other diseases of the circulatory system: Secondary | ICD-10-CM | POA: Diagnosis not present

## 2018-12-23 DIAGNOSIS — Z9114 Patient's other noncompliance with medication regimen: Secondary | ICD-10-CM

## 2018-12-23 DIAGNOSIS — J9601 Acute respiratory failure with hypoxia: Secondary | ICD-10-CM | POA: Diagnosis not present

## 2018-12-23 DIAGNOSIS — Z923 Personal history of irradiation: Secondary | ICD-10-CM | POA: Diagnosis not present

## 2018-12-23 DIAGNOSIS — Z7951 Long term (current) use of inhaled steroids: Secondary | ICD-10-CM

## 2018-12-23 DIAGNOSIS — Z85118 Personal history of other malignant neoplasm of bronchus and lung: Secondary | ICD-10-CM | POA: Diagnosis not present

## 2018-12-23 DIAGNOSIS — J9622 Acute and chronic respiratory failure with hypercapnia: Secondary | ICD-10-CM | POA: Diagnosis present

## 2018-12-23 DIAGNOSIS — K7460 Unspecified cirrhosis of liver: Secondary | ICD-10-CM | POA: Diagnosis present

## 2018-12-23 DIAGNOSIS — I252 Old myocardial infarction: Secondary | ICD-10-CM

## 2018-12-23 DIAGNOSIS — Z90710 Acquired absence of both cervix and uterus: Secondary | ICD-10-CM

## 2018-12-23 DIAGNOSIS — I25118 Atherosclerotic heart disease of native coronary artery with other forms of angina pectoris: Secondary | ICD-10-CM | POA: Diagnosis present

## 2018-12-23 DIAGNOSIS — I5082 Biventricular heart failure: Secondary | ICD-10-CM | POA: Diagnosis present

## 2018-12-23 DIAGNOSIS — Z20828 Contact with and (suspected) exposure to other viral communicable diseases: Secondary | ICD-10-CM | POA: Diagnosis present

## 2018-12-23 DIAGNOSIS — N184 Chronic kidney disease, stage 4 (severe): Secondary | ICD-10-CM | POA: Diagnosis present

## 2018-12-23 DIAGNOSIS — I509 Heart failure, unspecified: Secondary | ICD-10-CM | POA: Diagnosis not present

## 2018-12-23 DIAGNOSIS — T502X5A Adverse effect of carbonic-anhydrase inhibitors, benzothiadiazides and other diuretics, initial encounter: Secondary | ICD-10-CM | POA: Diagnosis not present

## 2018-12-23 DIAGNOSIS — R05 Cough: Secondary | ICD-10-CM | POA: Diagnosis not present

## 2018-12-23 DIAGNOSIS — N179 Acute kidney failure, unspecified: Secondary | ICD-10-CM

## 2018-12-23 DIAGNOSIS — Z950 Presence of cardiac pacemaker: Secondary | ICD-10-CM

## 2018-12-23 DIAGNOSIS — I083 Combined rheumatic disorders of mitral, aortic and tricuspid valves: Secondary | ICD-10-CM | POA: Diagnosis present

## 2018-12-23 DIAGNOSIS — I130 Hypertensive heart and chronic kidney disease with heart failure and stage 1 through stage 4 chronic kidney disease, or unspecified chronic kidney disease: Secondary | ICD-10-CM | POA: Diagnosis present

## 2018-12-23 DIAGNOSIS — I214 Non-ST elevation (NSTEMI) myocardial infarction: Secondary | ICD-10-CM

## 2018-12-23 DIAGNOSIS — R1084 Generalized abdominal pain: Secondary | ICD-10-CM | POA: Diagnosis not present

## 2018-12-23 DIAGNOSIS — J9621 Acute and chronic respiratory failure with hypoxia: Secondary | ICD-10-CM | POA: Diagnosis present

## 2018-12-23 DIAGNOSIS — J441 Chronic obstructive pulmonary disease with (acute) exacerbation: Secondary | ICD-10-CM | POA: Diagnosis present

## 2018-12-23 DIAGNOSIS — K7200 Acute and subacute hepatic failure without coma: Secondary | ICD-10-CM | POA: Diagnosis not present

## 2018-12-23 DIAGNOSIS — I5043 Acute on chronic combined systolic (congestive) and diastolic (congestive) heart failure: Secondary | ICD-10-CM | POA: Diagnosis present

## 2018-12-23 DIAGNOSIS — Z8701 Personal history of pneumonia (recurrent): Secondary | ICD-10-CM | POA: Diagnosis not present

## 2018-12-23 DIAGNOSIS — G9340 Encephalopathy, unspecified: Secondary | ICD-10-CM | POA: Diagnosis present

## 2018-12-23 DIAGNOSIS — I50813 Acute on chronic right heart failure: Secondary | ICD-10-CM | POA: Diagnosis not present

## 2018-12-23 DIAGNOSIS — I248 Other forms of acute ischemic heart disease: Secondary | ICD-10-CM | POA: Diagnosis present

## 2018-12-23 DIAGNOSIS — K5900 Constipation, unspecified: Secondary | ICD-10-CM

## 2018-12-23 DIAGNOSIS — Z9981 Dependence on supplemental oxygen: Secondary | ICD-10-CM

## 2018-12-23 DIAGNOSIS — R945 Abnormal results of liver function studies: Secondary | ICD-10-CM

## 2018-12-23 DIAGNOSIS — R112 Nausea with vomiting, unspecified: Secondary | ICD-10-CM

## 2018-12-23 DIAGNOSIS — R6520 Severe sepsis without septic shock: Secondary | ICD-10-CM

## 2018-12-23 DIAGNOSIS — R0602 Shortness of breath: Secondary | ICD-10-CM | POA: Diagnosis present

## 2018-12-23 DIAGNOSIS — I5033 Acute on chronic diastolic (congestive) heart failure: Secondary | ICD-10-CM | POA: Diagnosis not present

## 2018-12-23 DIAGNOSIS — Z955 Presence of coronary angioplasty implant and graft: Secondary | ICD-10-CM | POA: Diagnosis not present

## 2018-12-23 DIAGNOSIS — Z87891 Personal history of nicotine dependence: Secondary | ICD-10-CM

## 2018-12-23 DIAGNOSIS — R7989 Other specified abnormal findings of blood chemistry: Secondary | ICD-10-CM

## 2018-12-23 DIAGNOSIS — K219 Gastro-esophageal reflux disease without esophagitis: Secondary | ICD-10-CM | POA: Diagnosis present

## 2018-12-23 DIAGNOSIS — Z9049 Acquired absence of other specified parts of digestive tract: Secondary | ICD-10-CM | POA: Diagnosis not present

## 2018-12-23 DIAGNOSIS — Z7189 Other specified counseling: Secondary | ICD-10-CM | POA: Diagnosis not present

## 2018-12-23 DIAGNOSIS — K761 Chronic passive congestion of liver: Secondary | ICD-10-CM | POA: Diagnosis present

## 2018-12-23 DIAGNOSIS — N189 Chronic kidney disease, unspecified: Secondary | ICD-10-CM | POA: Diagnosis not present

## 2018-12-23 DIAGNOSIS — N183 Chronic kidney disease, stage 3 unspecified: Secondary | ICD-10-CM

## 2018-12-23 DIAGNOSIS — Z66 Do not resuscitate: Secondary | ICD-10-CM | POA: Diagnosis present

## 2018-12-23 DIAGNOSIS — I361 Nonrheumatic tricuspid (valve) insufficiency: Secondary | ICD-10-CM | POA: Diagnosis not present

## 2018-12-23 DIAGNOSIS — O85 Puerperal sepsis: Secondary | ICD-10-CM

## 2018-12-23 DIAGNOSIS — E1122 Type 2 diabetes mellitus with diabetic chronic kidney disease: Secondary | ICD-10-CM | POA: Diagnosis present

## 2018-12-23 DIAGNOSIS — E876 Hypokalemia: Secondary | ICD-10-CM | POA: Diagnosis present

## 2018-12-23 DIAGNOSIS — I340 Nonrheumatic mitral (valve) insufficiency: Secondary | ICD-10-CM | POA: Diagnosis not present

## 2018-12-23 DIAGNOSIS — E785 Hyperlipidemia, unspecified: Secondary | ICD-10-CM | POA: Diagnosis present

## 2018-12-23 DIAGNOSIS — I255 Ischemic cardiomyopathy: Secondary | ICD-10-CM | POA: Diagnosis present

## 2018-12-23 DIAGNOSIS — H9190 Unspecified hearing loss, unspecified ear: Secondary | ICD-10-CM | POA: Diagnosis present

## 2018-12-23 DIAGNOSIS — R609 Edema, unspecified: Secondary | ICD-10-CM

## 2018-12-23 DIAGNOSIS — D638 Anemia in other chronic diseases classified elsewhere: Secondary | ICD-10-CM | POA: Diagnosis present

## 2018-12-23 LAB — COMPREHENSIVE METABOLIC PANEL
ALT: 607 U/L — ABNORMAL HIGH (ref 0–44)
AST: 1975 U/L — ABNORMAL HIGH (ref 15–41)
Albumin: 3.6 g/dL (ref 3.5–5.0)
Alkaline Phosphatase: 81 U/L (ref 38–126)
Anion gap: 24 — ABNORMAL HIGH (ref 5–15)
BUN: 62 mg/dL — ABNORMAL HIGH (ref 8–23)
CO2: 29 mmol/L (ref 22–32)
Calcium: 8.1 mg/dL — ABNORMAL LOW (ref 8.9–10.3)
Chloride: 87 mmol/L — ABNORMAL LOW (ref 98–111)
Creatinine, Ser: 2.99 mg/dL — ABNORMAL HIGH (ref 0.44–1.00)
GFR calc Af Amer: 16 mL/min — ABNORMAL LOW (ref >60)
GFR calc non Af Amer: 14 mL/min — ABNORMAL LOW (ref >60)
Glucose, Bld: 129 mg/dL — ABNORMAL HIGH (ref 70–99)
Potassium: 3.8 mmol/L (ref 3.5–5.1)
Sodium: 140 mmol/L (ref 135–145)
Total Bilirubin: 3 mg/dL — ABNORMAL HIGH (ref 0.3–1.2)
Total Protein: 7.2 g/dL (ref 6.5–8.1)

## 2018-12-23 LAB — CBC WITH DIFFERENTIAL/PLATELET
Abs Immature Granulocytes: 0.08 10*3/uL — ABNORMAL HIGH (ref 0.00–0.07)
Basophils Absolute: 0 10*3/uL (ref 0.0–0.1)
Basophils Relative: 0 %
Eosinophils Absolute: 0.3 10*3/uL (ref 0.0–0.5)
Eosinophils Relative: 3 %
HCT: 34 % — ABNORMAL LOW (ref 36.0–46.0)
Hemoglobin: 10 g/dL — ABNORMAL LOW (ref 12.0–15.0)
Immature Granulocytes: 1 %
Lymphocytes Relative: 3 %
Lymphs Abs: 0.3 10*3/uL — ABNORMAL LOW (ref 0.7–4.0)
MCH: 25.1 pg — ABNORMAL LOW (ref 26.0–34.0)
MCHC: 29.4 g/dL — ABNORMAL LOW (ref 30.0–36.0)
MCV: 85.2 fL (ref 80.0–100.0)
Monocytes Absolute: 0.5 10*3/uL (ref 0.1–1.0)
Monocytes Relative: 4 %
Neutro Abs: 11.6 10*3/uL — ABNORMAL HIGH (ref 1.7–7.7)
Neutrophils Relative %: 89 %
Platelets: 167 10*3/uL (ref 150–400)
RBC: 3.99 MIL/uL (ref 3.87–5.11)
RDW: 18.5 % — ABNORMAL HIGH (ref 11.5–15.5)
WBC: 12.9 10*3/uL — ABNORMAL HIGH (ref 4.0–10.5)
nRBC: 0 % (ref 0.0–0.2)

## 2018-12-23 LAB — CK: Total CK: 470 U/L — ABNORMAL HIGH (ref 38–234)

## 2018-12-23 LAB — BRAIN NATRIURETIC PEPTIDE: B Natriuretic Peptide: 2346 pg/mL — ABNORMAL HIGH (ref 0.0–100.0)

## 2018-12-23 LAB — PROTIME-INR
INR: 1.7 — ABNORMAL HIGH (ref 0.8–1.2)
Prothrombin Time: 19.6 seconds — ABNORMAL HIGH (ref 11.4–15.2)

## 2018-12-23 LAB — ETHANOL: Alcohol, Ethyl (B): <10 mg/dL (ref <10)

## 2018-12-23 LAB — MAGNESIUM: Magnesium: 2.2 mg/dL (ref 1.7–2.4)

## 2018-12-23 LAB — TROPONIN I: Troponin I: 5.35 ng/mL (ref <0.03)

## 2018-12-23 LAB — ACETAMINOPHEN LEVEL: Acetaminophen (Tylenol), Serum: <10 ug/mL — ABNORMAL LOW (ref 10–30)

## 2018-12-23 LAB — APTT: aPTT: 44 seconds — ABNORMAL HIGH (ref 24–36)

## 2018-12-23 LAB — SALICYLATE LEVEL: Salicylate Lvl: <7 mg/dL (ref 2.8–30.0)

## 2018-12-23 MED ORDER — ASPIRIN 300 MG RE SUPP: 300.0000 mg | RECTAL | Status: AC

## 2018-12-23 MED ORDER — NITROGLYCERIN 0.4 MG SL SUBL: 0.4000 mg | SUBLINGUAL_TABLET | SUBLINGUAL | Status: DC | PRN

## 2018-12-23 MED ORDER — SODIUM CHLORIDE 0.9% FLUSH: 3.0000 mL | INTRAVENOUS | Status: DC | PRN

## 2018-12-23 MED ORDER — SODIUM CHLORIDE 0.9% FLUSH
3.0000 mL | Freq: Two times a day (BID) | INTRAVENOUS | Status: DC
  Administered 2018-12-24 – 2018-12-29 (×12): 3 mL via INTRAVENOUS

## 2018-12-23 MED ORDER — ACETAMINOPHEN 325 MG PO TABS
650.0000 mg | ORAL_TABLET | ORAL | Status: DC | PRN
  Administered 2018-12-24 – 2018-12-26 (×2): 650 mg via ORAL
  Filled 2018-12-23 (×2): qty 2

## 2018-12-23 MED ORDER — FUROSEMIDE 10 MG/ML IJ SOLN
80.0000 mg | Freq: Once | INTRAMUSCULAR | Status: AC
  Administered 2018-12-23: 21:00:00 80 mg via INTRAVENOUS
  Filled 2018-12-23: qty 8

## 2018-12-23 MED ORDER — ONDANSETRON HCL 4 MG/2ML IJ SOLN
4.0000 mg | Freq: Four times a day (QID) | INTRAMUSCULAR | Status: DC | PRN
  Administered 2018-12-25 – 2018-12-28 (×5): 4 mg via INTRAVENOUS
  Filled 2018-12-23 (×6): qty 2

## 2018-12-23 MED ORDER — ASPIRIN 81 MG PO CHEW
324.0000 mg | CHEWABLE_TABLET | ORAL | Status: AC
  Administered 2018-12-24: 01:00:00 324 mg via ORAL
  Filled 2018-12-23: qty 4

## 2018-12-23 MED ORDER — TRAMADOL HCL 50 MG PO TABS
50.0000 mg | ORAL_TABLET | Freq: Once | ORAL | Status: AC
  Administered 2018-12-23: 50 mg via ORAL
  Filled 2018-12-23: qty 1

## 2018-12-23 MED ORDER — HEPARIN (PORCINE) 25000 UT/250ML-% IV SOLN
900.0000 [IU]/h | INTRAVENOUS | Status: DC
  Administered 2018-12-23: 900 [IU]/h via INTRAVENOUS
  Filled 2018-12-23: qty 250

## 2018-12-23 MED ORDER — HEPARIN BOLUS VIA INFUSION
4000.0000 [IU] | Freq: Once | INTRAVENOUS | Status: AC
  Administered 2018-12-23: 23:00:00 4000 [IU] via INTRAVENOUS
  Filled 2018-12-23: qty 4000

## 2018-12-23 MED ORDER — SODIUM CHLORIDE 0.9 % IV SOLN
250.0000 mL | INTRAVENOUS | Status: DC | PRN
  Administered 2018-12-28: 250 mL via INTRAVENOUS

## 2018-12-23 MED ORDER — ASPIRIN EC 81 MG PO TBEC
81.0000 mg | DELAYED_RELEASE_TABLET | Freq: Every day | ORAL | Status: DC
  Administered 2018-12-24 – 2018-12-28 (×4): 81 mg via ORAL
  Filled 2018-12-23 (×5): qty 1

## 2018-12-23 MED ORDER — ATORVASTATIN CALCIUM 20 MG PO TABS: 40.0000 mg | ORAL_TABLET | Freq: Every day | ORAL | Status: DC

## 2018-12-23 NOTE — ED Notes (Signed)
Unable to obtain blood from IV, RN straight stuck x2 to get blood with no success. Will contact phlebotomy

## 2018-12-23 NOTE — ED Notes (Signed)
Followed up with IV team about consult, will follow

## 2018-12-23 NOTE — ED Notes (Signed)
Date and time results received: 12/07/2018 2130 (use smartphrase ".now" to insert current time)  Test: troponin Critical Value: 5.35  Name of Provider Notified: Joni Fears, MD  Orders Received? Or Actions Taken?: Orders Received - See Orders for details

## 2018-12-23 NOTE — ED Provider Notes (Signed)
[facility] Emergency Department Provider Note  ____________________________________________  Time seen: Approximately 10:53 PM  I have reviewed the triage vital signs and the nursing notes.   HISTORY  Chief Complaint Altered Mental Status and Shortness of Breath  Level 5 Caveat: Portions of the History and Physical including HPI and review of systems are unable to be completely obtained due to altered mental status   HPI Regina Burke is a 85 y.o. female with a history of CAD CKD COPD diverticulitis hypertension who sent to the ED today due to shortness of breath and confusion over the past 2 days, gradual onset.  Patient denies any chest pain.  Family also reported to EMS that she has had vomiting and diarrhea as well.  Patient denies any acute pain complaints at this time.  No fevers chills or body aches.  Patient reports she has been taking her diuretic as prescribed.  Past Medical History:  Diagnosis Date  . Anemia of chronic disease   . Arthritis   . Asthma   . Cardiac resynchronization therapy pacemaker (CRT-P) in place    a. 03/31/16:  Medtronic Percepta Quad CRT-P MRI SureScan (serial Number [id number]) device.  . Chronic diastolic CHF (congestive heart failure) ([place])    a. 10/2015 Echo: EF 55-65%, Gr1 DD, mild MR, mildly dil LA, nl RV fxn, nl PASP;  b. 03/2016 Echo: EF 30-35%, mod MR, mod dil LA; c. 12/2016 Echo: EF 60-65%, no rwma, mild AS, mildly dil LA, PASP 68mmHg.  . CKD (chronic kidney disease), stage IV ([place])   . COPD (chronic obstructive pulmonary disease) ([place])   . Coronary artery disease    a. 11/2014 NSTEMI/PCI: LM nl, LAD 46m, D1 30, LCX mild dzs, OM1 20p, OM2 51m, OM3 90p (2.25x8 Promus Premier DES), RCA nl.   . Cough    CHRONIC AT NIGHT  . Cushing's disease ([place])   . Depression   . Diverticulitis   . Edema    FEET/LEGS  . GERD (gastroesophageal reflux disease)   . Gout   . History of hiatal hernia   . History of pneumonia   .  HLD (hyperlipidemia)   . HOH (hard of hearing)   . Hypertension   . Hypertensive heart disease   . Lung cancer ([place]) dx'd 2014   S/P radiation 2015  . Migraine   . Mixed Ischemic and Nonischemic Cardiomyopathy ([place])    a. 03/2016 Echo: EF 30-35%;  b. 12/2016 Echo: EF 60-65%.  . Moderate mitral regurgitation   . Multiple allergies   . Myocardial infarction ([place])   . Oxygen deficiency    2LITERS  . Persistent atrial fibrillation    a. CHADS2VASc ==> 7 (CHF, HTN, age x 2, DM, vascular disease, and gender)--was on renal dosed eliquis but this was d/c'd in 12/2016 in setting of dark stools/anemia;  c. 03/2016 s/p AVN and MDT BiV ICD placement.  . Presence of permanent cardiac pacemaker   . S/P AV nodal ablation    a. on 03/31/16 for persistant afib with CRT-P placement  . Sleep apnea   . Type II diabetes mellitus [facility])      Patient Active Problem List   Diagnosis Date Noted  . Encephalopathy acute 05/30/2018  . Acute on chronic heart failure ([place])   . Goals of care, counseling/discussion   . Advance care planning   . S/p left hip fracture 01/31/2017  . Pre-operative cardiovascular examination 01/31/2017  . Other diseases of stomach and duodenum   .  Dizziness 08/25/2016  . LBBB (left bundle branch block) 04/01/2016  . S/P AV nodal ablation   . Cardiac resynchronization therapy pacemaker (CRT-P) in place   . Persistent atrial fibrillation ([place]): CHA2DS2-Vasc = ~7. On Xarelto 15 mg (age & renal Fxn) 03/18/2016  . Congestive dilated cardiomyopathy ([place]) 03/18/2016    Class: Temporary  . DNR (do not resuscitate) discussion   . Palliative care encounter   . Persistent atrial fibrillation   . Chronic diastolic CHF (congestive heart failure) ([place]) 02/16/2016  . HLD (hyperlipidemia) 02/16/2016  . Iron deficiency anemia due to chronic blood loss   . Essential hypertension   . Diaphoresis   . Renal insufficiency   . Chronic obstructive pulmonary disease ([place]) 07/14/2015  . Status post  thoracentesis   . S/P thoracentesis   . H/O: lung cancer   . Pleural effusion   . Coronary artery disease involving native coronary artery of native heart without angina pectoris   . S/P coronary artery stent placement   . Pressure ulcer 06/08/2015  . Dehydration 05/09/2015  . Diabetes ([place]) 05/09/2015  . Closed fracture nasal bone 05/09/2015  . Cancer of upper lobe of right lung ([place]) 01/08/2015  . Allergic state 01/08/2015  . Anemia associated with chronic renal failure 01/08/2015  . Chronic kidney disease 01/08/2015  . CAFL (chronic airflow limitation) ([place]) 01/08/2015  . Arthritis, degenerative 01/08/2015  . 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 Diabetes mellitus, type 2 ([place]) 09/11/2014  . Type 2 diabetes mellitus ([place]) 09/11/2014     Past Surgical History:  Procedure Laterality Date  . ABDOMINAL HYSTERECTOMY    . ABLATION     July 2017  . ADRENALECTOMY Left 1980's   "[place]"  . APPENDECTOMY    . BREAST CYST EXCISION Left   . CATARACT EXTRACTION W/PHACO Right 12/28/2015   Procedure: CATARACT EXTRACTION PHACO AND INTRAOCULAR LENS PLACEMENT (IOC);  Surgeon: Birder Robson, MD;  Location: [facility] ORS;  Service: Ophthalmology;  Laterality: Right;  [place] 48.4   . CATARACT EXTRACTION W/PHACO Left 11/14/2016   Procedure: CATARACT EXTRACTION PHACO AND INTRAOCULAR LENS PLACEMENT (IOC);  Surgeon: Birder Robson, MD;  Location: [facility] ORS;  Service: Ophthalmology;  Laterality: Left;  [place] 59.8 AP% 18.1 CDE 10.78 Fluid pack lot # [id number]  . CHOLECYSTECTOMY    . CORONARY ANGIOPLASTY WITH STENT PLACEMENT  12/23/2014  . ELECTROPHYSIOLOGIC STUDY N/A 03/08/2016   Procedure: CARDIOVERSION;  Surgeon: Wende Bushy, MD;  Location: [facility] ORS;  Service: Cardiovascular;  Laterality: N/A;  . ELECTROPHYSIOLOGIC STUDY N/A 03/07/2016   Procedure: Cardioversion;  Surgeon: Wende Bushy, MD;  Location: [facility] ORS;  Service: Cardiovascular;  Laterality: N/A;  . ELECTROPHYSIOLOGIC STUDY N/A 03/31/2016    Procedure: AV Node Ablation;  Surgeon: Will Meredith Leeds, MD;  Location: [place] CV LAB;  Service: Cardiovascular;  Laterality: N/A;  . EP IMPLANTABLE DEVICE N/A 03/31/2016   Procedure: BiV Pacemaker Insertion CRT-P;  Surgeon: Will Meredith Leeds, MD;  Location: [place] CV LAB;  Service: Cardiovascular;  Laterality: N/A;  . ESOPHAGOGASTRODUODENOSCOPY (EGD) WITH PROPOFOL N/A 01/26/2017   Procedure: ESOPHAGOGASTRODUODENOSCOPY (EGD) WITH PROPOFOL;  Surgeon: [name], MD;  Location: [facility] ENDOSCOPY;  Service: Endoscopy;  Laterality: N/A;  . FRACTURE SURGERY    . HIP PINNING,CANNULATED Left 02/01/2017   Procedure: CANNULATED HIP PINNING;  Surgeon: Lovell Sheehan, MD;  Location: [facility] ORS;  Service: Orthopedics;  Laterality: Left;  . INSERT / REPLACE / REMOVE PACEMAKER  02/2016  . LEFT HEART CATHETERIZATION WITH CORONARY ANGIOGRAM N/A 12/23/2014   Procedure:  LEFT HEART CATHETERIZATION WITH CORONARY ANGIOGRAM;  Surgeon: Burnell Blanks, MD;  Location: [facility] CATH LAB;  Service: Cardiovascular;  Laterality: N/A;  . PERCUTANEOUS CORONARY STENT INTERVENTION (PCI-S)  12/23/2014   Procedure: PERCUTANEOUS CORONARY STENT INTERVENTION (PCI-S);  Surgeon: Burnell Blanks, MD;  Location: [facility] CATH LAB;  Service: Cardiovascular;;  Promus 2.25x8  . TONSILLECTOMY    . TRANSTHORACIC ECHOCARDIOGRAM  11/26/2015   Technically difficult study. EF 55-60%. Normal wall motion. GR 1 DD.  . TUBAL LIGATION    . WRIST FRACTURE SURGERY Bilateral ~ 2000     Prior to Admission medications   Medication Sig Start Date End Date Taking? Authorizing Provider  Aclidinium Bromide 400 MCG/ACT AEPB Inhale into the lungs. 06/13/18 12/10/18  [provider]  ALPRAZolam Duanne Moron) 0.5 MG tablet Take 0.5 mg by mouth at bedtime as needed for sleep.  12/27/16   [provider]  atorvastatin (LIPITOR) 20 MG tablet Take 20 mg by mouth daily.    [provider]  busPIRone (BUSPAR) 7.5 MG tablet Take 7.5 mg by  mouth 2 (two) times daily.    [provider]  feeding supplement, ENSURE ENLIVE, (ENSURE ENLIVE) LIQD Take 237 mLs by mouth 3 (three) times daily with meals. 03/02/17   Fritzi Mandes, MD  fenofibrate (TRICOR) 48 MG tablet Take 48 mg by mouth every evening.     [provider]  fluticasone furoate-vilanterol (BREO ELLIPTA) 100-25 MCG/INH AEPB Inhale 1 puff into the lungs daily as needed (shortness of breath).     [provider]  levalbuterol (XOPENEX) 1.25 MG/0.5ML nebulizer solution Take 1.25 mg by nebulization every 6 (six) hours as needed for wheezing or shortness of breath. Patient not taking: Reported on 05/30/2018 03/02/17   Fritzi Mandes, MD  metoprolol succinate (TOPROL XL) 25 MG 24 hr tablet Take 25 mg by mouth daily.  12/19/16   Minna Merritts, MD  nitroGLYCERIN (NITROSTAT) 0.4 MG SL tablet Place 1 tablet (0.4 mg total) under the tongue every 5 (five) minutes as needed for chest pain. Patient not taking: Reported on 07/02/2018 12/24/14   Barrett, Evelene Croon, PA-C  sertraline (ZOLOFT) 50 MG tablet Take 1 tablet (50 mg total) by mouth daily. 03/24/16   Gladstone Lighter, MD  spironolactone (ALDACTONE) 25 MG tablet Take 25 mg by mouth daily. 06/20/18   [provider]  tiotropium (SPIRIVA) 18 MCG inhalation capsule Place 18 mcg into inhaler and inhale daily as needed (shortness of breath).     [provider]  torsemide (DEMADEX) 20 MG tablet Take 40 mg by mouth daily. 06/27/18   [provider]  traMADol (ULTRAM) 50 MG tablet Take 50 mg by mouth 2 (two) times daily as needed for moderate pain or severe pain.  03/06/18   [provider]     Allergies Ciprofloxacin; Doxycycline; Penicillins; Sulfa antibiotics; Latex; Morphine and related; Albuterol sulfate; Macrobid [nitrofurantoin macrocrystal]; and Cefuroxime   Family History  Problem Relation Age of Onset  . Heart disease Mother   . Diabetes Mother   . Osteoarthritis Mother   .  Hypertension Mother   . Heart disease Father   . Hypertension Father   . COPD Brother     Social History Social History   Tobacco Use  . Smoking status: Former Smoker    Packs/day: 1.00    Years: 45.00    Pack years: 45.00    Types: Cigarettes    Last attempt to quit: 04/25/1994    Years since quitting: 24.6  .  Smokeless tobacco: Never Used  Substance Use Topics  . Alcohol use: No    Comment: 04/26/202016 "might have a couple mixed drinks/year"  . Drug use: No    Review of Systems  Constitutional:   No fever or chills.  ENT:   No sore throat. No rhinorrhea. Cardiovascular:   No chest pain or syncope. Respiratory:   Positive shortness of breath and productive cough. Gastrointestinal:   Negative for abdominal pain, positive vomiting and diarrhea.  Musculoskeletal:   Negative for focal pain positive leg swelling All other systems reviewed and are negative except as documented above in ROS and HPI.  ____________________________________________   PHYSICAL EXAM:  VITAL SIGNS: ED Triage Vitals  Enc Vitals Group     BP 12/17/2018 1836 (!) 157/62     Pulse Rate 12/21/2018 1844 76     Resp 12/19/2018 1844 20     Temp 12/16/2018 1844 (!) 97 F (36.1 C)     Temp Source 11/29/2018 1844 Axillary     SpO2 12/21/2018 1841 96 %     Weight 12/26/2018 1847 176 lb 5.9 oz (80 kg)     Height 11/29/2018 1847 5\' 6"  (1.676 m)     Head Circumference --      Peak Flow --      Pain Score 12/09/2018 2205 Asleep     Pain Loc --      Pain Edu? --      Excl. in [place]? --     Vital signs reviewed, nursing assessments reviewed.   Constitutional:   Alert and oriented to person and place.  Ill-appearing. Eyes:   Conjunctivae are normal. EOMI. PERRL. ENT      Head:   Normocephalic and atraumatic.      Nose:   No congestion/rhinnorhea.       Mouth/Throat:   MMM, no pharyngeal erythema. No peritonsillar mass.       Neck:   No meningismus. Full ROM. Hematological/Lymphatic/Immunilogical:   No cervical  lymphadenopathy. Cardiovascular:   RRR. Symmetric bilateral radial and DP pulses.  No murmurs. Cap refill less than 2 seconds. Respiratory:   Normal respiratory effort without tachypnea/retractions.  Diffuse crackles in all lung fields Gastrointestinal:   Soft and nontender. Non distended. There is no CVA tenderness.  No rebound, rigidity, or guarding.  Musculoskeletal:   Normal range of motion in all extremities. No joint effusions.  No lower extremity tenderness.  2+ pitting edema bilateral lower extremities Neurologic:   Normal speech and language.  Motor grossly intact. No acute focal neurologic deficits are appreciated.  Skin:    Skin is warm, dry and intact. No rash noted.  No petechiae, purpura, or bullae.  ____________________________________________    LABS (pertinent positives/negatives) (all labs ordered are listed, but only abnormal results are displayed) Labs Reviewed  COMPREHENSIVE METABOLIC PANEL - Abnormal; Notable for the following components:      Result Value   Chloride 87 (*)    Glucose, Bld 129 (*)    BUN 62 (*)    Creatinine, Ser 2.99 (*)    Calcium 8.1 (*)    AST 1,975 (*)    ALT 607 (*)    Total Bilirubin 3.0 (*)    GFR calc non Af Amer 14 (*)    GFR calc Af Amer 16 (*)    Anion gap 24 (*)    All other components within normal limits  TROPONIN I - Abnormal; Notable for the following components:   Troponin I 5.35 (*)  All other components within normal limits  CBC WITH DIFFERENTIAL/PLATELET - Abnormal; Notable for the following components:   WBC 12.9 (*)    Hemoglobin 10.0 (*)    HCT 34.0 (*)    MCH 25.1 (*)    MCHC 29.4 (*)    RDW 18.5 (*)    Neutro Abs 11.6 (*)    Lymphs Abs 0.3 (*)    Abs Immature Granulocytes 0.08 (*)    All other components within normal limits  BRAIN NATRIURETIC PEPTIDE - Abnormal; Notable for the following components:   B Natriuretic Peptide 2,346.0 (*)    All other components within normal limits  URINE CULTURE  CK   MAGNESIUM  ETHANOL  ACETAMINOPHEN LEVEL  SALICYLATE LEVEL  URINALYSIS, COMPLETE (UACMP) WITH MICROSCOPIC  APTT  PROTIME-INR   ____________________________________________   EKG  Interpreted by me Ventricular paced rhythm, left axis, left bundle branch block, no acute ischemic changes.  ____________________________________________    [id number]  Dg Chest Portable 1 View  Result Date: 12/22/2018 CLINICAL DATA:  85 year old female shortness of breath and altered mental status. Abnormal pulmonary auscultation. EXAM: PORTABLE CHEST 1 VIEW COMPARISON:  10/28/2018 and earlier. FINDINGS: Portable AP semi upright view at 2049 hours. Stable lung volumes. Stable cardiomegaly and mediastinal contours. Stable left chest cardiac pacemaker. Calcified aortic atherosclerosis. Visualized tracheal air column is within normal limits. Increased pulmonary interstitial opacity, symmetric in both lungs. No pneumothorax. No pleural effusion or consolidation. No acute osseous abnormality identified. IMPRESSION: 1. Increased bilateral pulmonary interstitial opacity. Top differential considerations include acute pulmonary interstitial edema and viral/atypical respiratory infection. No pleural effusion. 2. Stable cardiomegaly. Aortic Atherosclerosis (ICD10-I70.0). Electronically Signed   By: Genevie Ann M.D.   On: 12/15/2018 21:17    ____________________________________________   PROCEDURES .Critical Care Performed by: Carrie Mew, MD Authorized by: Carrie Mew, MD   Critical care provider statement:    Critical care time (minutes):  35   Critical care time was exclusive of:  Separately billable procedures and treating other patients   Critical care was necessary to treat or prevent imminent or life-threatening deterioration of the following conditions:  Cardiac failure, hepatic failure, renal failure and respiratory failure   Critical care was time spent personally by me on the following  activities:  Development of treatment plan with patient or surrogate, discussions with consultants, evaluation of patient's response to treatment, examination of patient, obtaining history from patient or surrogate, ordering and performing treatments and interventions, ordering and review of laboratory studies, ordering and review of radiographic studies, pulse oximetry, re-evaluation of patient's condition and review of old charts    ____________________________________________  DIFFERENTIAL DIAGNOSIS   Pulmonary edema, non-STEMI, pneumonia, renal failure  CLINICAL IMPRESSION / ASSESSMENT AND PLAN / ED COURSE  Medications ordered in the ED: Medications  heparin bolus via infusion 4,000 Units (has no administration in time range)    Followed by  heparin ADULT infusion 100 units/mL (25000 units/243mL sodium chloride 0.45%) (has no administration in time range)  traMADol (ULTRAM) tablet 50 mg (50 mg Oral Given 12/08/2018 2114)  furosemide (LASIX) injection 80 mg (80 mg Intravenous Given 12/06/2018 2115)    Pertinent labs & imaging results that were available during my care of the patient were reviewed by me and considered in my medical decision making (see chart for details).  Regina Burke was evaluated in Emergency Department on 12/04/2018 for the symptoms described in the history of present illness. She was evaluated in the context of the global COVID-19 pandemic, which  necessitated consideration that the patient might be at risk for infection with the SARS-CoV-2 virus that causes COVID-19. Institutional protocols and algorithms that pertain to the evaluation of patients at risk for COVID-19 are in a state of rapid change based on information released by regulatory bodies including the CDC and federal and state organizations. These policies and algorithms were followed during the patient's care in the ED.   Patient presents with shortness of breath, exam concerning for pulmonary edema and CHF  exacerbation.  Check labs, chest x-ray.  Plan to give diuretics and admit for symptomatic management.  Clinical Course as of Dec 23 2251  Mon Dec 23, 2018  2134 Cxr c/w clinically likely CHF / acute pulm edema. Called by lab that troponin is 5.5.  lasix ordered. Will start heparin and admit.    [PS]  2251 D/w Cards, Dr. Stanford Breed, agrees with heparin, lasix, admission. No further recs at this time.    [PS]    Clinical Course User Index [PS] Carrie Mew, MD     ____________________________________________   FINAL CLINICAL IMPRESSION(S) / ED DIAGNOSES    Final diagnoses:  NSTEMI (non-ST elevated myocardial infarction) ([place])  Acute congestive heart failure, unspecified heart failure type ([place])  Acute liver failure without hepatic coma  AKI (acute kidney injury) ([place])  Stage 3 chronic kidney disease [facility])     ED Discharge Orders    None      Portions of this note were generated with dragon dictation software. Dictation errors may occur despite best attempts at proofreading.   Carrie Mew, MD 12/15/2018 2259

## 2018-12-23 NOTE — ED Notes (Signed)
ED TO INPATIENT HANDOFF REPORT  ED Nurse Name and Phone #: Keisean Skowron  S Name/Age/Gender Regina Burke 85 y.o. female Room/Bed: ED06A/ED06A  Code Status   Code Status: Full Code  Home/SNF/Other Home Patient oriented to: situation Is this baseline? Yes   Triage Complete: Triage complete  Chief Complaint SOB  Triage Note Patient arrives via EMS for altered mental status and respiratory distress x2 days. Family says she has had N/V/D as well.   Allergies Allergies  Allergen Reactions  . Ciprofloxacin Shortness Of Breath, Itching and Rash  . Doxycycline Shortness Of Breath, Itching and Rash  . Penicillins Shortness Of Breath, Itching, Rash and Other (See Comments)    Has patient had a PCN reaction causing immediate rash, facial/tongue/throat swelling, SOB or lightheadedness with hypotension: Yes Has patient had a PCN reaction causing severe rash involving mucus membranes or skin necrosis: No Has patient had a PCN reaction that required hospitalization No Has patient had a PCN reaction occurring within the last 10 years: No If all of the above answers are "NO", then may proceed with Cephalosporin use.  . Sulfa Antibiotics Shortness Of Breath, Itching and Rash  . Latex Itching  . Morphine And Related Itching  . Albuterol Sulfate   . Macrobid [Nitrofurantoin Macrocrystal] Itching  . Cefuroxime Rash    Blisters in mouth    Level of Care/Admitting Diagnosis ED Disposition    ED Disposition Condition [facility] Area: [place] [[id number]]  Level of Care: Telemetry [5]  Covid Evaluation: N/A  Diagnosis: NSTEMI (non-ST elevated myocardial infarction) [facility]) [[id number]]  Admitting Physician: Max Sane [[id number]]  Attending Physician: Max Sane [[id number]]  Estimated length of stay: past midnight tomorrow  Certification:: I certify this patient will need inpatient services for at least 2 midnights  PT Class (Do Not Modify): Inpatient [101]  PT  Acc Code (Do Not Modify): Private [1]       B Medical/Surgery History Past Medical History:  Diagnosis Date  . Anemia of chronic disease   . Arthritis   . Asthma   . Cardiac resynchronization therapy pacemaker (CRT-P) in place    a. 03/31/16:  Medtronic Percepta Quad CRT-P MRI SureScan (serial Number [id number]) device.  . Chronic diastolic CHF (congestive heart failure) ([place])    a. 10/2015 Echo: EF 55-65%, Gr1 DD, mild MR, mildly dil LA, nl RV fxn, nl PASP;  b. 03/2016 Echo: EF 30-35%, mod MR, mod dil LA; c. 12/2016 Echo: EF 60-65%, no rwma, mild AS, mildly dil LA, PASP 74mmHg.  . CKD (chronic kidney disease), stage IV ([place])   . COPD (chronic obstructive pulmonary disease) ([place])   . Coronary artery disease    a. 11/2014 NSTEMI/PCI: LM nl, LAD 53m, D1 30, LCX mild dzs, OM1 20p, OM2 78m, OM3 90p (2.25x8 Promus Premier DES), RCA nl.   . Cough    CHRONIC AT NIGHT  . Cushing's disease ([place])   . Depression   . Diverticulitis   . Edema    FEET/LEGS  . GERD (gastroesophageal reflux disease)   . Gout   . History of hiatal hernia   . History of pneumonia   . HLD (hyperlipidemia)   . HOH (hard of hearing)   . Hypertension   . Hypertensive heart disease   . Lung cancer ([place]) dx'd 2014   S/P radiation 2015  . Migraine   . Mixed Ischemic and Nonischemic Cardiomyopathy ([place])    a. 03/2016 Echo: EF 30-35%;  b.  12/2016 Echo: EF 60-65%.  . Moderate mitral regurgitation   . Multiple allergies   . Myocardial infarction ([place])   . Oxygen deficiency    2LITERS  . Persistent atrial fibrillation    a. CHADS2VASc ==> 7 (CHF, HTN, age x 2, DM, vascular disease, and gender)--was on renal dosed eliquis but this was d/c'd in 12/2016 in setting of dark stools/anemia;  c. 03/2016 s/p AVN and MDT BiV ICD placement.  . Presence of permanent cardiac pacemaker   . S/P AV nodal ablation    a. on 03/31/16 for persistant afib with CRT-P placement  . Sleep apnea   . Type II diabetes mellitus ([place])    Past  Surgical History:  Procedure Laterality Date  . ABDOMINAL HYSTERECTOMY    . ABLATION     July 2017  . ADRENALECTOMY Left 1980's   "[place]"  . APPENDECTOMY    . BREAST CYST EXCISION Left   . CATARACT EXTRACTION W/PHACO Right 12/28/2015   Procedure: CATARACT EXTRACTION PHACO AND INTRAOCULAR LENS PLACEMENT (IOC);  Surgeon: Birder Robson, MD;  Location: [facility] ORS;  Service: Ophthalmology;  Laterality: Right;  [place] 48.4   . CATARACT EXTRACTION W/PHACO Left 11/14/2016   Procedure: CATARACT EXTRACTION PHACO AND INTRAOCULAR LENS PLACEMENT (IOC);  Surgeon: Birder Robson, MD;  Location: [facility] ORS;  Service: Ophthalmology;  Laterality: Left;  [place] 59.8 AP% 18.1 CDE 10.78 Fluid pack lot # [id number]  . CHOLECYSTECTOMY    . CORONARY ANGIOPLASTY WITH STENT PLACEMENT  04/28/202016  . ELECTROPHYSIOLOGIC STUDY N/A 03/08/2016   Procedure: CARDIOVERSION;  Surgeon: Wende Bushy, MD;  Location: [facility] ORS;  Service: Cardiovascular;  Laterality: N/A;  . ELECTROPHYSIOLOGIC STUDY N/A 03/07/2016   Procedure: Cardioversion;  Surgeon: Wende Bushy, MD;  Location: [facility] ORS;  Service: Cardiovascular;  Laterality: N/A;  . ELECTROPHYSIOLOGIC STUDY N/A 03/31/2016   Procedure: AV Node Ablation;  Surgeon: Will Meredith Leeds, MD;  Location: [facility];  Service: Cardiovascular;  Laterality: N/A;  . EP IMPLANTABLE DEVICE N/A 03/31/2016   Procedure: BiV Pacemaker Insertion CRT-P;  Surgeon: Will Meredith Leeds, MD;  Location: [facility];  Service: Cardiovascular;  Laterality: N/A;  . ESOPHAGOGASTRODUODENOSCOPY (EGD) WITH PROPOFOL N/A 01/26/2017   Procedure: ESOPHAGOGASTRODUODENOSCOPY (EGD) WITH PROPOFOL;  Surgeon: [name], MD;  Location: [facility] ENDOSCOPY;  Service: Endoscopy;  Laterality: N/A;  . FRACTURE SURGERY    . HIP PINNING,CANNULATED Left 02/01/2017   Procedure: CANNULATED HIP PINNING;  Surgeon: Lovell Sheehan, MD;  Location: [facility] ORS;  Service: Orthopedics;  Laterality: Left;  . INSERT / REPLACE / REMOVE  PACEMAKER  02/2016  . LEFT HEART CATHETERIZATION WITH CORONARY ANGIOGRAM N/A 04/01/202016   Procedure: LEFT HEART CATHETERIZATION WITH CORONARY ANGIOGRAM;  Surgeon: Burnell Blanks, MD;  Location: [facility] CATH LAB;  Service: Cardiovascular;  Laterality: N/A;  . PERCUTANEOUS CORONARY STENT INTERVENTION (PCI-S)  04/03/202016   Procedure: PERCUTANEOUS CORONARY STENT INTERVENTION (PCI-S);  Surgeon: Burnell Blanks, MD;  Location: [facility] CATH LAB;  Service: Cardiovascular;;  Promus 2.25x8  . TONSILLECTOMY    . TRANSTHORACIC ECHOCARDIOGRAM  11/26/2015   Technically difficult study. EF 55-60%. Normal wall motion. GR 1 DD.  . TUBAL LIGATION    . WRIST FRACTURE SURGERY Bilateral ~ 2000     A IV Location/Drains/Wounds Patient Lines/Drains/Airways Status   Active Line/Drains/Airways    Name:   Placement date:   Placement time:   Site:   Days:   Peripheral IV 11/30/2018 Upper;Left Arm   12/23/18    1955  Arm   less than 1   Peripheral IV 12/02/2018 Right;Medial Antecubital   12/20/2018    2339    Antecubital   less than 1          Intake/Output Last 24 hours No intake or output data in the 24 hours ending 12/13/2018 2341  Labs/Imaging Results for orders placed or performed during the hospital encounter of 12/18/2018 (from the past 48 hour(s))  Comprehensive metabolic panel     Status: Abnormal   Collection Time: 12/06/2018  8:45 PM  Result Value Ref Range   Sodium 140 135 - 145 mmol/L    Comment: LYTES REPEATED TO CONFIRM SMA   Potassium 3.8 3.5 - 5.1 mmol/L   Chloride 87 (L) 98 - 111 mmol/L   CO2 29 22 - 32 mmol/L   Glucose, Bld 129 (H) 70 - 99 mg/dL   BUN 62 (H) 8 - 23 mg/dL   Creatinine, Ser 2.99 (H) 0.44 - 1.00 mg/dL   Calcium 8.1 (L) 8.9 - 10.3 mg/dL   Total Protein 7.2 6.5 - 8.1 g/dL   Albumin 3.6 3.5 - 5.0 g/dL   AST 1,975 (H) 15 - 41 U/L   ALT 607 (H) 0 - 44 U/L   Alkaline Phosphatase 81 38 - 126 U/L   Total Bilirubin 3.0 (H) 0.3 - 1.2 mg/dL   GFR calc non Af Amer 14 (L) >60 mL/min    GFR calc Af Amer 16 (L) >60 mL/min   Anion gap 24 (H) 5 - 15    Comment: Performed at [facility], [address] [place], [place] [address]  Troponin I - Once     Status: Abnormal   Collection Time: 12/09/2018  8:45 PM  Result Value Ref Range   Troponin I 5.35 (HH) <0.03 ng/mL    Comment: CRITICAL RESULT CALLED TO, READ BACK BY AND VERIFIED WITH KATE BUCKNUM AT 2128 12/02/2018 SMA Performed at [facility], [address] [place], [place] [address]   CBC with Differential     Status: Abnormal   Collection Time: 12/21/2018  8:45 PM  Result Value Ref Range   WBC 12.9 (H) 4.0 - 10.5 K/uL   RBC 3.99 3.87 - 5.11 MIL/uL   Hemoglobin 10.0 (L) 12.0 - 15.0 g/dL   HCT 34.0 (L) 36.0 - 46.0 %   MCV 85.2 80.0 - 100.0 fL   MCH 25.1 (L) 26.0 - 34.0 pg   MCHC 29.4 (L) 30.0 - 36.0 g/dL   RDW 18.5 (H) 11.5 - 15.5 %   Platelets 167 150 - 400 K/uL   nRBC 0.0 0.0 - 0.2 %   Neutrophils Relative % 89 %   Neutro Abs 11.6 (H) 1.7 - 7.7 K/uL   Lymphocytes Relative 3 %   Lymphs Abs 0.3 (L) 0.7 - 4.0 K/uL   Monocytes Relative 4 %   Monocytes Absolute 0.5 0.1 - 1.0 K/uL   Eosinophils Relative 3 %   Eosinophils Absolute 0.3 0.0 - 0.5 K/uL   Basophils Relative 0 %   Basophils Absolute 0.0 0.0 - 0.1 K/uL   Immature Granulocytes 1 %   Abs Immature Granulocytes 0.08 (H) 0.00 - 0.07 K/uL    Comment: Performed at [facility], [address] [place], [place] [address]  CK     Status: Abnormal   Collection Time: 12/08/2018  8:45 PM  Result Value Ref Range   Total CK 470 (H) 38 - 234 U/L    Comment: Performed at [facility], [address], [place], [place] [address]  Magnesium     Status: None   Collection Time: 12/03/2018  8:45 PM  Result Value Ref Range   Magnesium 2.2 1.7 - 2.4 mg/dL    Comment: Performed at [facility], [address] [place], [place] [address]  Ethanol     Status: None   Collection Time: 12/21/2018  8:45 PM  Result Value Ref Range    Alcohol, Ethyl (B) <10 <10 mg/dL    Comment: (NOTE) Lowest detectable limit for serum alcohol is 10 mg/dL. For medical purposes only. Performed at [facility], [address] [place], [place] [address]   Acetaminophen level     Status: Abnormal   Collection Time: 12/05/2018  8:45 PM  Result Value Ref Range   Acetaminophen (Tylenol), Serum <10 (L) 10 - 30 ug/mL    Comment: (NOTE) Therapeutic concentrations vary significantly. A range of 10-30 ug/mL  may be an effective concentration for many patients. However, some  are best treated at concentrations outside of this range. Acetaminophen concentrations >150 ug/mL at 4 hours after ingestion  and >50 ug/mL at 12 hours after ingestion are often associated with  toxic reactions. Performed at [facility], [address] [place], [place] [address]   Salicylate level     Status: None   Collection Time: 12/09/2018  8:45 PM  Result Value Ref Range   Salicylate Lvl <6.3 2.8 - 30.0 mg/dL    Comment: Performed at [facility], [address] [place], [place] [address]  APTT     Status: Abnormal   Collection Time: 12/22/2018  8:45 PM  Result Value Ref Range   aPTT 44 (H) 24 - 36 seconds    Comment:        IF BASELINE aPTT IS ELEVATED, SUGGEST PATIENT RISK ASSESSMENT BE USED TO DETERMINE APPROPRIATE ANTICOAGULANT THERAPY. Performed at [facility], [address] [place], [place] [address]   Protime-INR     Status: Abnormal   Collection Time: 11/27/2018  8:45 PM  Result Value Ref Range   Prothrombin Time 19.6 (H) 11.4 - 15.2 seconds   INR 1.7 (H) 0.8 - 1.2    Comment: (NOTE) INR goal varies based on device and disease states. Performed at [facility], [address] [place], [place] [address]   Brain natriuretic peptide     Status: Abnormal   Collection Time: 12/06/2018  8:49 PM  Result Value Ref Range   B Natriuretic Peptide 2,346.0 (H) 0.0 - 100.0 pg/mL    Comment: Performed at  [facility], [address], [place], [place] [address]   Dg Chest Portable 1 View  Result Date: 12/19/2018 CLINICAL DATA:  85 year old female shortness of breath and altered mental status. Abnormal pulmonary auscultation. EXAM: PORTABLE CHEST 1 VIEW COMPARISON:  10/28/2018 and earlier. FINDINGS: Portable AP semi upright view at 2049 hours. Stable lung volumes. Stable cardiomegaly and mediastinal contours. Stable left chest cardiac pacemaker. Calcified aortic atherosclerosis. Visualized tracheal air column is within normal limits. Increased pulmonary interstitial opacity, symmetric in both lungs. No pneumothorax. No pleural effusion or consolidation. No acute osseous abnormality identified. IMPRESSION: 1. Increased bilateral pulmonary interstitial opacity. Top differential considerations include acute pulmonary interstitial edema and viral/atypical respiratory infection. No pleural effusion. 2. Stable cardiomegaly. Aortic Atherosclerosis (ICD10-I70.0). Electronically Signed   By: Genevie Ann M.D.   On: 11/28/2018 21:17    Pending Labs [employer] (From admission, onward)    Start  Ordered   12/24/18 0700  Heparin level (unfractionated)  Once-Timed,   STAT     12/01/2018 2339   12/26/2018 2210  Urinalysis, Complete w Microscopic  ONCE - STAT,   STAT     12/20/2018 2209   12/19/2018 2210  Urine culture  Once,   STAT     12/02/2018 2209          Vitals/Pain Today's Vitals   12/06/2018 2200 12/01/2018 2205 12/17/2018 2230 12/03/2018 2300  BP: (!) 142/55  (!) 142/57 (!) 141/52  Pulse: 69  69 79  Resp: 19  16 17   Temp:      TempSrc:      SpO2: 98%  100% 100%  Weight:      Height:      PainSc:  Asleep      Isolation Precautions No active isolations  Medications Medications  heparin bolus via infusion 4,000 Units (4,000 Units Intravenous Bolus from Bag 12/24/2018 2257)    Followed by  heparin ADULT infusion 100 units/mL (25000 units/25mL sodium chloride 0.45%) (900 Units/hr Intravenous  New Bag/Given 12/08/2018 2259)  aspirin chewable tablet 324 mg (has no administration in time range)    Or  aspirin suppository 300 mg (has no administration in time range)  aspirin EC tablet 81 mg (has no administration in time range)  nitroGLYCERIN (NITROSTAT) SL tablet 0.4 mg (has no administration in time range)  acetaminophen (TYLENOL) tablet 650 mg (has no administration in time range)  ondansetron (ZOFRAN) injection 4 mg (has no administration in time range)  sodium chloride flush (NS) 0.9 % injection 3 mL (has no administration in time range)  sodium chloride flush (NS) 0.9 % injection 3 mL (has no administration in time range)  0.9 %  sodium chloride infusion (has no administration in time range)  atorvastatin (LIPITOR) tablet 40 mg (has no administration in time range)  traMADol (ULTRAM) tablet 50 mg (50 mg Oral Given 12/22/2018 2114)  furosemide (LASIX) injection 80 mg (80 mg Intravenous Given 12/15/2018 2115)    Mobility walks with device Low fall risk   Focused Assessments Cardiac Assessment Handoff:  Cardiac Rhythm: A-V Sequential paced Lab Results  Component Value Date   CKTOTAL 470 (H) 12/18/2018   CKMB 1.6 02/25/2018   TROPONINI 5.35 (HH) 12/06/2018   No results found for: DDIMER Does the Patient currently have chest pain? No     R Recommendations: See Admitting Provider Note  Report given to:   Additional Notes:

## 2018-12-23 NOTE — ED Notes (Signed)
Lab confirmed they have blue top, will run PTT/ INR

## 2018-12-23 NOTE — ED Notes (Signed)
Patient bladder-scanned after reporting not being able to void today. Volume <67mL. Purewick placed at this time

## 2018-12-23 NOTE — ED Notes (Signed)
Phelbotomy successful with lab collection at this time.

## 2018-12-23 NOTE — Consult Note (Signed)
ANTICOAGULATION CONSULT NOTE - Initial Consult  Pharmacy Consult for Heparin Infusion  Indication: chest pain/ACS  Allergies  Allergen Reactions  . Ciprofloxacin Shortness Of Breath, Itching and Rash  . Doxycycline Shortness Of Breath, Itching and Rash  . Penicillins Shortness Of Breath, Itching, Rash and Other (See Comments)    Has patient had a PCN reaction causing immediate rash, facial/tongue/throat swelling, SOB or lightheadedness with hypotension: Yes Has patient had a PCN reaction causing severe rash involving mucus membranes or skin necrosis: No Has patient had a PCN reaction that required hospitalization No Has patient had a PCN reaction occurring within the last 10 years: No If all of the above answers are "NO", then may proceed with Cephalosporin use.  . Sulfa Antibiotics Shortness Of Breath, Itching and Rash  . Latex Itching  . Morphine And Related Itching  . Albuterol Sulfate   . Macrobid [Nitrofurantoin Macrocrystal] Itching  . Cefuroxime Rash    Blisters in mouth    Patient Measurements: Height: 5\' 6"  (167.6 cm) Weight: 176 lb 5.9 oz (80 kg) IBW/kg (Calculated) : 59.3 Heparin Dosing Weight: 75.9   Vital Signs: Temp: 97 F (36.1 C) (04/27 1844) Temp Source: Axillary (04/27 1844) BP: 142/55 (04/27 2200) Pulse Rate: 69 (04/27 2200)  Labs: Recent Labs    12/25/2018 2045  HGB 10.0*  HCT 34.0*  PLT 167  CREATININE 2.99*  TROPONINI 5.35*    Estimated Creatinine Clearance: 15.2 mL/min (A) (by C-G formula based on SCr of 2.99 mg/dL (H)).   Medical History: Past Medical History:  Diagnosis Date  . Anemia of chronic disease   . Arthritis   . Asthma   . Cardiac resynchronization therapy pacemaker (CRT-P) in place    a. 03/31/16:  Medtronic Percepta Quad CRT-P MRI SureScan (serial Number [id number]) device.  . Chronic diastolic CHF (congestive heart failure) ([place])    a. 10/2015 Echo: EF 55-65%, Gr1 DD, mild MR, mildly dil LA, nl RV fxn, nl PASP;  b. 03/2016  Echo: EF 30-35%, mod MR, mod dil LA; c. 12/2016 Echo: EF 60-65%, no rwma, mild AS, mildly dil LA, PASP 61mmHg.  . CKD (chronic kidney disease), stage IV ([place])   . COPD (chronic obstructive pulmonary disease) ([place])   . Coronary artery disease    a. 11/2014 NSTEMI/PCI: LM nl, LAD 50m, D1 30, LCX mild dzs, OM1 20p, OM2 39m, OM3 90p (2.25x8 Promus Premier DES), RCA nl.   . Cough    CHRONIC AT NIGHT  . Cushing's disease ([place])   . Depression   . Diverticulitis   . Edema    FEET/LEGS  . GERD (gastroesophageal reflux disease)   . Gout   . History of hiatal hernia   . History of pneumonia   . HLD (hyperlipidemia)   . HOH (hard of hearing)   . Hypertension   . Hypertensive heart disease   . Lung cancer ([place]) dx'd 2014   S/P radiation 2015  . Migraine   . Mixed Ischemic and Nonischemic Cardiomyopathy ([place])    a. 03/2016 Echo: EF 30-35%;  b. 12/2016 Echo: EF 60-65%.  . Moderate mitral regurgitation   . Multiple allergies   . Myocardial infarction ([place])   . Oxygen deficiency    2LITERS  . Persistent atrial fibrillation    a. CHADS2VASc ==> 7 (CHF, HTN, age x 2, DM, vascular disease, and gender)--was on renal dosed eliquis but this was d/c'd in 12/2016 in setting of dark stools/anemia;  c. 03/2016 s/p AVN and MDT BiV  ICD placement.  . Presence of permanent cardiac pacemaker   . S/P AV nodal ablation    a. on 03/31/16 for persistant afib with CRT-P placement  . Sleep apnea   . Type II diabetes mellitus ([place])     Assessment: Pharmacy was consulted for heparin infusion dosing and monitoring in 85 yo female admitted for ACS/STEMI. No reported anticoagulants PTA.   Goal of Therapy:  Heparin level 0.3-0.7 units/ml Monitor platelets by anticoagulation protocol: Yes   Plan:  Give 4000 units bolus x 1 Start heparin infusion at 900 units/hr Check anti-Xa level in 8 hours and daily while on heparin Continue to monitor H&H and platelets  Pernell Dupre, PharmD, BCPS Clinical  Pharmacist 12/22/2018 10:26 PM

## 2018-12-23 NOTE — ED Notes (Signed)
IV team at bedside 

## 2018-12-23 NOTE — ED Triage Notes (Signed)
Patient arrives via EMS for altered mental status and respiratory distress x2 days. Family says she has had N/V/D as well.

## 2018-12-24 ENCOUNTER — Inpatient Hospital Stay: Payer: Medicare Other

## 2018-12-24 ENCOUNTER — Inpatient Hospital Stay (HOSPITAL_COMMUNITY)
Admit: 2018-12-24 | Discharge: 2018-12-24 | Disposition: A | Discharge status: Home / Self Care | Payer: Medicare Other | Attending: Internal Medicine | Admitting: Internal Medicine

## 2018-12-24 DIAGNOSIS — I248 Other forms of acute ischemic heart disease: Secondary | ICD-10-CM

## 2018-12-24 DIAGNOSIS — R0602 Shortness of breath: Secondary | ICD-10-CM

## 2018-12-24 DIAGNOSIS — K7200 Acute and subacute hepatic failure without coma: Secondary | ICD-10-CM

## 2018-12-24 DIAGNOSIS — I4821 Permanent atrial fibrillation: Secondary | ICD-10-CM

## 2018-12-24 DIAGNOSIS — I25119 Atherosclerotic heart disease of native coronary artery with unspecified angina pectoris: Secondary | ICD-10-CM

## 2018-12-24 DIAGNOSIS — N184 Chronic kidney disease, stage 4 (severe): Secondary | ICD-10-CM

## 2018-12-24 DIAGNOSIS — I340 Nonrheumatic mitral (valve) insufficiency: Secondary | ICD-10-CM

## 2018-12-24 DIAGNOSIS — R05 Cough: Secondary | ICD-10-CM

## 2018-12-24 DIAGNOSIS — R1084 Generalized abdominal pain: Secondary | ICD-10-CM

## 2018-12-24 DIAGNOSIS — N179 Acute kidney failure, unspecified: Secondary | ICD-10-CM

## 2018-12-24 DIAGNOSIS — I361 Nonrheumatic tricuspid (valve) insufficiency: Secondary | ICD-10-CM

## 2018-12-24 DIAGNOSIS — I5033 Acute on chronic diastolic (congestive) heart failure: Secondary | ICD-10-CM

## 2018-12-24 LAB — TROPONIN I
Troponin I: 5.56 ng/mL (ref <0.03)
Troponin I: 5.02 ng/mL (ref <0.03)

## 2018-12-24 LAB — HEPARIN LEVEL (UNFRACTIONATED)
Heparin Unfractionated: <0.1 IU/mL — ABNORMAL LOW (ref 0.30–0.70)
Heparin Unfractionated: <0.1 IU/mL — ABNORMAL LOW (ref 0.30–0.70)

## 2018-12-24 LAB — SARS CORONAVIRUS 2 BY RT PCR (HOSPITAL ORDER, PERFORMED IN ~~LOC~~ HOSPITAL LAB): SARS Coronavirus 2: NEGATIVE

## 2018-12-24 LAB — ECHOCARDIOGRAM COMPLETE
Height: 66 in
Weight: 2821.89 oz

## 2018-12-24 LAB — LACTIC ACID, PLASMA: Lactic Acid, Venous: 1 mmol/L (ref 0.5–1.9)

## 2018-12-24 LAB — URINALYSIS, COMPLETE (UACMP) WITH MICROSCOPIC
Bilirubin Urine: NEGATIVE
Glucose, UA: NEGATIVE mg/dL
Ketones, ur: NEGATIVE mg/dL
Nitrite: NEGATIVE
Protein, ur: 100 mg/dL — AB
Specific Gravity, Urine: 1.012 (ref 1.005–1.030)
pH: 5 (ref 5.0–8.0)

## 2018-12-24 LAB — ABO/RH: ABO/RH(D): A POS

## 2018-12-24 MED ORDER — FUROSEMIDE 10 MG/ML IJ SOLN: 40.0000 mg | Freq: Every day | INTRAMUSCULAR | Status: DC

## 2018-12-24 MED ORDER — SERTRALINE HCL 50 MG PO TABS
50.0000 mg | ORAL_TABLET | Freq: Every day | ORAL | Status: DC
  Administered 2018-12-24 – 2018-12-28 (×4): 50 mg via ORAL
  Filled 2018-12-24 (×5): qty 1

## 2018-12-24 MED ORDER — VITAMIN B-12 1000 MCG PO TABS
1000.0000 ug | ORAL_TABLET | Freq: Every day | ORAL | Status: DC
  Administered 2018-12-24 – 2018-12-28 (×5): 1000 ug via ORAL
  Filled 2018-12-24 (×5): qty 1

## 2018-12-24 MED ORDER — HEPARIN BOLUS VIA INFUSION
2300.0000 [IU] | Freq: Once | INTRAVENOUS | Status: AC
  Administered 2018-12-24: 22:00:00 2300 [IU] via INTRAVENOUS
  Filled 2018-12-24: qty 2300

## 2018-12-24 MED ORDER — TORSEMIDE 20 MG PO TABS
40.0000 mg | ORAL_TABLET | Freq: Every day | ORAL | Status: DC
  Administered 2018-12-24: 40 mg via ORAL
  Filled 2018-12-24: qty 2

## 2018-12-24 MED ORDER — BUSPIRONE HCL 15 MG PO TABS
7.5000 mg | ORAL_TABLET | Freq: Two times a day (BID) | ORAL | Status: DC
  Administered 2018-12-24 – 2018-12-28 (×7): 7.5 mg via ORAL
  Filled 2018-12-24 (×12): qty 1

## 2018-12-24 MED ORDER — ENSURE ENLIVE PO LIQD
237.0000 mL | Freq: Three times a day (TID) | ORAL | Status: DC
  Administered 2018-12-24 – 2018-12-25 (×2): 237 mL via ORAL

## 2018-12-24 MED ORDER — TIOTROPIUM BROMIDE MONOHYDRATE 18 MCG IN CAPS
18.0000 ug | ORAL_CAPSULE | Freq: Every day | RESPIRATORY_TRACT | Status: DC | PRN
  Filled 2018-12-24: qty 5

## 2018-12-24 MED ORDER — ATORVASTATIN CALCIUM 20 MG PO TABS: 20.0000 mg | ORAL_TABLET | Freq: Every day | ORAL | Status: DC

## 2018-12-24 MED ORDER — METOPROLOL SUCCINATE ER 50 MG PO TB24
25.0000 mg | ORAL_TABLET | Freq: Every day | ORAL | Status: DC
  Administered 2018-12-24 – 2018-12-28 (×4): 25 mg via ORAL
  Filled 2018-12-24 (×5): qty 1

## 2018-12-24 MED ORDER — HEPARIN BOLUS VIA INFUSION
2000.0000 [IU] | Freq: Once | INTRAVENOUS | Status: AC
  Administered 2018-12-24: 2000 [IU] via INTRAVENOUS
  Filled 2018-12-24: qty 2000

## 2018-12-24 MED ORDER — SPIRONOLACTONE 25 MG PO TABS
25.0000 mg | ORAL_TABLET | Freq: Every day | ORAL | Status: DC
  Administered 2018-12-24: 25 mg via ORAL
  Filled 2018-12-24: qty 1

## 2018-12-24 MED ORDER — FUROSEMIDE 10 MG/ML IJ SOLN
20.0000 mg | Freq: Every day | INTRAMUSCULAR | Status: DC
  Administered 2018-12-24: 20 mg via INTRAVENOUS
  Filled 2018-12-24: qty 2

## 2018-12-24 MED ORDER — FLUTICASONE FUROATE-VILANTEROL 100-25 MCG/INH IN AEPB
1.0000 | INHALATION_SPRAY | Freq: Every day | RESPIRATORY_TRACT | Status: DC
  Administered 2018-12-24 – 2018-12-27 (×4): 1 via RESPIRATORY_TRACT
  Filled 2018-12-24 (×2): qty 28

## 2018-12-24 MED ORDER — FENOFIBRATE 160 MG PO TABS
160.0000 mg | ORAL_TABLET | Freq: Every day | ORAL | Status: DC
  Administered 2018-12-24: 160 mg via ORAL
  Filled 2018-12-24: qty 1

## 2018-12-24 MED ORDER — HEPARIN (PORCINE) 25000 UT/250ML-% IV SOLN
2100.0000 [IU]/h | INTRAVENOUS | Status: DC
  Administered 2018-12-24: 1200 [IU]/h via INTRAVENOUS
  Administered 2018-12-25: 2100 [IU]/h via INTRAVENOUS
  Administered 2018-12-25: 1800 [IU]/h via INTRAVENOUS
  Filled 2018-12-24 (×3): qty 250

## 2018-12-24 NOTE — Consult Note (Signed)
Cardiology Consultation:   Patient ID: Regina Burke MRN: [id number]; DOB: 1933-07-16  Admit date: 12/19/2018 Date of Consult: 12/24/2018  Primary Care Provider: Tracie Harrier, MD Primary Cardiologist: Esmond Plants, MD PhD Primary Electrophysiologist:  Jolyn Nap, MD   Patient Profile:   Regina Burke is a 85 y.o. female with a hx of coronary artery disease status post PCI to OM 3 in 2016 in setting of NSTEMI, chronic systolic and diastolic heart failure, permanent atrial fibrillation status post AV node ablation and biventricular pacemaker, chronic kidney disease stage IV, chronic respiratory failure with hypoxia and hypercapnia secondary to COPD, hypertension, hyperlipidemia, type 2 diabetes mellitus, and chronic anemia with prior GI bleed who is being seen today for the evaluation of shortness of breath and elevated troponin at the request of Dr. Manuella Ghazi.  History of Present Illness:   Regina Burke presented to the emergency department yesterday due to worsening shortness of breath, nausea, and abdominal pain.  She does not recall much about her presentation; additional information was gathered from the patient's chart and her son, [name].  Regina Burke has a history of chronic nausea that waxes and wanes.  She was seen by her PCP in late March and diagnosed with a UTI.  She was prescribed Bactrim but only completed 3 days of a 5-day course.  She noted increasing shortness of breath over the last week, prompting her to see her PCP 5 days ago.  Chest radiograph was ordered but no further intervention pursued.  Regina Burke reports intermittent cough with greenish sputum production over the last few days.  She also has chest pain when coughing or taking a deep breath.  She continues to have nausea, having thrown up water yesterday.  She has also had several episodes of diarrhea, though she is unable to characterize this further.  She notes that her stools have been dark, though she is uncertain  if this could be related to iron that she was taking.  She has not had any hematochezia or hematemesis.  Currently, Regina Burke continues to feel short of breath.  She has chronic orthopnea but feels like it might be a little worse than usual.  She also reports having gained 36 pounds in the last week (141->177 pounds).  She feels like she is swollen all over.  She reports being compliant with her medications, though her son notes that she frequently will not take her medications or wear her supplemental oxygen.  Past Medical History:  Diagnosis Date   Anemia of chronic disease    Arthritis    Asthma    Cardiac resynchronization therapy pacemaker (CRT-P) in place    a. 03/31/16:  Medtronic Percepta Quad CRT-P MRI SureScan (serial Number [id number]) device.   Chronic diastolic CHF (congestive heart failure) ([place])    a. 10/2015 Echo: EF 55-65%, Gr1 DD, mild MR, mildly dil LA, nl RV fxn, nl PASP;  b. 03/2016 Echo: EF 30-35%, mod MR, mod dil LA; c. 12/2016 Echo: EF 60-65%, no rwma, mild AS, mildly dil LA, PASP 41mmHg.   CKD (chronic kidney disease), stage IV (HCC)    COPD (chronic obstructive pulmonary disease) (HCC)    Coronary artery disease    a. 11/2014 NSTEMI/PCI: LM nl, LAD 79m, D1 30, LCX mild dzs, OM1 20p, OM2 64m, OM3 90p (2.25x8 Promus Premier DES), RCA nl.    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 03/2016 Echo: EF 30-35%;  b. 12/2016 Echo: EF 60-65%.   Moderate mitral regurgitation    Multiple allergies    Myocardial infarction (HCC)    Oxygen deficiency    2LITERS     Persistent atrial fibrillation    a. CHADS2VASc ==> 7 (CHF, HTN, age x 2, DM, vascular disease, and gender)--was on renal dosed eliquis but this was d/c'd in 12/2016 in setting of dark stools/anemia;  c. 03/2016 s/p AVN and MDT BiV ICD placement.   Presence of permanent cardiac pacemaker    S/P AV nodal ablation    a. on 03/31/16 for persistant afib with CRT-P placement   Sleep apnea    Type II diabetes mellitus [facility])     Past Surgical History:  Procedure Laterality Date   ABDOMINAL HYSTERECTOMY     ABLATION     July 2017   ADRENALECTOMY Left 1980's   "[place]"   APPENDECTOMY     BREAST CYST EXCISION Left    CATARACT EXTRACTION W/PHACO Right 12/28/2015   Procedure: CATARACT EXTRACTION PHACO AND INTRAOCULAR LENS PLACEMENT ([place]);  Surgeon: Birder Robson, MD;  Location: [facility] ORS;  Service: Ophthalmology;  Laterality: Right;  [place] 48.4    CATARACT EXTRACTION W/PHACO Left 11/14/2016   Procedure: CATARACT EXTRACTION PHACO AND INTRAOCULAR LENS PLACEMENT (IOC);  Surgeon: Birder Robson, MD;  Location: [facility] ORS;  Service: Ophthalmology;  Laterality: Left;  [place] 59.8 AP% 18.1 CDE 10.78 Fluid pack lot # [id number]   CHOLECYSTECTOMY     CORONARY ANGIOPLASTY WITH STENT PLACEMENT  12/23/2014   ELECTROPHYSIOLOGIC STUDY N/A 03/08/2016   Procedure: CARDIOVERSION;  Surgeon: Wende Bushy, MD;  Location: [facility] ORS;  Service: Cardiovascular;  Laterality: N/A;   ELECTROPHYSIOLOGIC STUDY N/A 03/07/2016   Procedure: Cardioversion;  Surgeon: Wende Bushy, MD;  Location: [facility] ORS;  Service: Cardiovascular;  Laterality: N/A;   ELECTROPHYSIOLOGIC STUDY N/A 03/31/2016   Procedure: AV Node Ablation;  Surgeon: Will Meredith Leeds, MD;  Location: [facility] CV LAB;  Service: Cardiovascular;  Laterality: N/A;   EP IMPLANTABLE DEVICE N/A 03/31/2016   Procedure: BiV Pacemaker Insertion CRT-P;  Surgeon: Will Meredith Leeds, MD;  Location: [place] [facility];  Service: Cardiovascular;  Laterality: N/A;    ESOPHAGOGASTRODUODENOSCOPY (EGD) WITH PROPOFOL N/A 01/26/2017   Procedure: ESOPHAGOGASTRODUODENOSCOPY (EGD) WITH PROPOFOL;  Surgeon: [name], MD;  Location: [facility] ENDOSCOPY;  Service: Endoscopy;  Laterality: N/A;   FRACTURE SURGERY     HIP PINNING,CANNULATED Left 02/01/2017   Procedure: CANNULATED HIP PINNING;  Surgeon: Lovell Sheehan, MD;  Location: [facility] ORS;  Service: Orthopedics;  Laterality: Left;   INSERT / REPLACE / REMOVE PACEMAKER  02/2016   LEFT HEART CATHETERIZATION WITH CORONARY ANGIOGRAM N/A 12/23/2014   Procedure: LEFT HEART CATHETERIZATION WITH CORONARY ANGIOGRAM;  Surgeon: Burnell Blanks, MD;  Location: [facility] CATH LAB;  Service: Cardiovascular;  Laterality: N/A;   PERCUTANEOUS CORONARY STENT INTERVENTION (PCI-S)  12/23/2014   Procedure: PERCUTANEOUS CORONARY STENT INTERVENTION (PCI-S);  Surgeon: Burnell Blanks, MD;  Location: [facility] CATH LAB;  Service: Cardiovascular;;  Promus 2.25x8  TONSILLECTOMY     TRANSTHORACIC ECHOCARDIOGRAM  11/26/2015   Technically difficult study. EF 55-60%. Normal wall motion. GR 1 DD.   TUBAL LIGATION     WRIST FRACTURE SURGERY Bilateral ~ 2000     Home Medications:  Prior to Admission medications   Medication Sig Start Date Ariadne Rissmiller Date Taking? Authorizing Provider  ALPRAZolam Duanne Moron) 0.5 MG tablet Take 0.5 mg by mouth at bedtime as needed for sleep.  12/27/16  Yes [provider]  atorvastatin (LIPITOR) 20 MG tablet Take 20 mg by mouth daily.   Yes [provider]  busPIRone (BUSPAR) 7.5 MG tablet Take 7.5 mg by mouth 2 (two) times daily.   Yes [provider]  fenofibrate (TRICOR) 48 MG tablet Take 48 mg by mouth daily.    Yes [provider]  levalbuterol (XOPENEX) 1.25 MG/0.5ML nebulizer solution Take 1.25 mg by nebulization every 6 (six) hours as needed for wheezing or shortness of breath. 03/02/17  Yes Fritzi Mandes, MD  metoprolol succinate (TOPROL XL) 25 MG 24 hr tablet Take 25 mg by mouth daily.   12/19/16  Yes Gollan, Kathlene November, MD  ondansetron (ZOFRAN-ODT) 4 MG disintegrating tablet Take 1 tablet by mouth every 8 (eight) hours as needed.    Yes [provider]  sertraline (ZOLOFT) 50 MG tablet Take 1 tablet (50 mg total) by mouth daily. 03/24/16  Yes Gladstone Lighter, MD  spironolactone (ALDACTONE) 25 MG tablet Take 25 mg by mouth daily. 06/20/18  Yes [provider]  torsemide (DEMADEX) 20 MG tablet Take 40 mg by mouth daily. 06/27/18  Yes [provider]  traMADol (ULTRAM) 50 MG tablet Take 50 mg by mouth 2 (two) times daily.  03/06/18  Yes [provider]  Aclidinium Bromide 400 MCG/ACT AEPB Inhale into the lungs. 06/13/18 12/10/18  [provider]  feeding supplement, ENSURE ENLIVE, (ENSURE ENLIVE) LIQD Take 237 mLs by mouth 3 (three) times daily with meals. 03/02/17   Fritzi Mandes, MD  FERREX 150 150 MG capsule Take 150 mg by mouth daily. 10/02/18   [provider]  fluticasone furoate-vilanterol (BREO ELLIPTA) 100-25 MCG/INH AEPB Inhale 1 puff into the lungs daily.     [provider]  ipratropium (ATROVENT) 0.06 % nasal spray USE 2 SPRAY(S) IN EACH NOSTRIL TWICE DAILY 10/02/18   [provider]  nitroGLYCERIN (NITROSTAT) 0.4 MG SL tablet Place 1 tablet (0.4 mg total) under the tongue every 5 (five) minutes as needed for chest pain. Patient not taking: Reported on 12/24/2018 12/24/14   Barrett, Evelene Croon, PA-C  tiotropium (SPIRIVA) 18 MCG inhalation capsule Place 18 mcg into inhaler and inhale daily as needed (shortness of breath).     [provider]  vitamin B-12 (CYANOCOBALAMIN) 1000 MCG tablet Take 1 tablet by mouth daily. 12/03/2018 12/23/19  [provider]    Inpatient Medications: Scheduled Meds:  aspirin EC  81 mg Oral Daily   atorvastatin  40 mg Oral q1800   busPIRone  7.5 mg Oral BID   feeding supplement (ENSURE ENLIVE)  237 mL Oral TID WC   fenofibrate  160 mg Oral Daily   fluticasone  furoate-vilanterol  1 puff Inhalation Daily   furosemide  20 mg Intravenous Daily   metoprolol succinate  25 mg Oral Daily   sertraline  50 mg Oral Daily   sodium chloride flush  3 mL Intravenous Q12H   spironolactone  25 mg Oral Daily   torsemide  40 mg Oral Daily   vitamin  B-12  1,000 mcg Oral Daily   Continuous Infusions:  sodium chloride     heparin 900 Units/hr (12/17/2018 2259)   PRN Meds: sodium chloride, acetaminophen, nitroGLYCERIN, ondansetron (ZOFRAN) IV, sodium chloride flush, tiotropium  Allergies:    Allergies  Allergen Reactions   Ciprofloxacin Shortness Of Breath, Itching and Rash   Doxycycline Shortness Of Breath, Itching and Rash   Penicillins Shortness Of Breath, Itching, Rash and Other (See Comments)    Has patient had a PCN reaction causing immediate rash, facial/tongue/throat swelling, SOB or lightheadedness with hypotension: Yes Has patient had a PCN reaction causing severe rash involving mucus membranes or skin necrosis: No Has patient had a PCN reaction that required hospitalization No Has patient had a PCN reaction occurring within the last 10 years: No If all of the above answers are "NO", then may proceed with Cephalosporin use.   Sulfa Antibiotics Shortness Of Breath, Itching and Rash   Latex Itching   Morphine And Related Itching   Albuterol Sulfate    Macrobid [Nitrofurantoin Macrocrystal] Itching   Cefuroxime Rash    Blisters in mouth    Social History:   Social History   Tobacco Use   Smoking status: Former Smoker    Packs/day: 1.00    Years: 45.00    Pack years: 45.00    Types: Cigarettes    Last attempt to quit: 04/25/1994    Years since quitting: 24.6   Smokeless tobacco: Never Used  Substance Use Topics   Alcohol use: No    Comment: 04/29/202016 "might have a couple mixed drinks/year"   Drug use: No    Family History:   Family History  Problem Relation Age of Onset   Heart disease Mother    Diabetes Mother     Osteoarthritis Mother    Hypertension Mother    Heart disease Father    Hypertension Father    COPD Brother      ROS:  Review of Systems  Constitutional: Positive for malaise/fatigue. Negative for chills, fever and weight loss.  HENT: Negative.   Eyes: Negative.   Respiratory: Positive for cough, sputum production and shortness of breath. Negative for wheezing.   Cardiovascular: Positive for chest pain, palpitations, orthopnea and leg swelling.  Gastrointestinal: Positive for abdominal pain, diarrhea, melena (dark stools - attributes to iron), nausea and vomiting. Negative for blood in stool.  Genitourinary: Negative.   Musculoskeletal: Positive for joint pain.  Skin: Negative.   Neurological: Positive for dizziness.  Endo/Heme/Allergies: Negative.   Psychiatric/Behavioral: Negative.     Physical Exam/Data:   Vitals:   11/28/2018 2300 12/24/18 0049 12/24/18 0453 12/24/18 0819  BP: (!) 141/52 (!) 155/59 (!) 132/54 (!) 157/55  Pulse: 79 71 70 72  Resp: 17 18 18 18   Temp:  98.9 F (37.2 C) 98.7 F (37.1 C) 97.7 F (36.5 C)  TempSrc:  Oral    SpO2: 100% 100% 97% 94%  Weight:      Height:        Intake/Output Summary (Last 24 hours) at 12/24/2018 0950 Last data filed at 12/24/2018 4536 Gross per 24 hour  Intake 143 ml  Output 0 ml  Net 143 ml   Last 3 Weights 12/16/2018 10/28/2018 07/02/2018  Weight (lbs) 176 lb 5.9 oz 174 lb 9.7 oz 174 lb 8 oz  Weight (kg) 80 kg 79.2 kg 79.153 kg     Body mass index is 28.47 kg/m.  General: Pale, elderly woman, lying in bed. HEENT: Conjunctival pallor noted.  No scleral icterus. Lymph: no adenopathy Neck: JVP approximately 10 cm with positive HJR. Endocrine:  No thryomegaly Vascular: No carotid bruits; trace radial and pedal pulses bilaterally. Cardiac: Regular rate and rhythm with 2/6 crescendo-decrescendo systolic murmur at the right upper sternal border and 2/6 holosystolic murmur at the left lower sternal border.  No rubs  or gallops.   Lungs: Scattered rhonchi with diminished breath sounds at both lung bases.  No wheezes or crackles. Abd: Bowel sounds present.  Soft with mild diffuse tenderness.  Unable to assess HSM due to body habitus. Ext: Trace pretibial and thigh edema bilaterally. Musculoskeletal:  No deformities, BUE and BLE strength normal and equal Skin: warm and dry  Neuro:  CNs 3-12 intact, no focal abnormalities noted Psych: Blunted affect.  Patient A&O x2.  EKG:  The EKG was personally reviewed and demonstrates: Atrial fibrillation with ventricular pacing. Telemetry:  Telemetry was personally reviewed and demonstrates: Atrial fibrillation with ventricular pacing.  Relevant CV Studies: Echo (01/16/2018): - Left ventricle: The cavity size was normal. There was mild   concentric hypertrophy. Systolic function was normal. The   estimated ejection fraction was in the range of 55% to 60%. Wall   motion was normal; there were no regional wall motion   abnormalities. The study is not technically sufficient to allow   evaluation of LV diastolic function. - Aortic valve: There was very mild stenosis. Peak gradient (S): 20   mm Hg. - Left atrium: The atrium was moderately dilated. - Right atrium: The atrium was mildly dilated. - Tricuspid valve: There was moderate regurgitation. - Pulmonary arteries: Systolic pressure was moderate to severely   elevated. PA peak pressure: 64 mm Hg (S).  Laboratory Data:  Chemistry Recent Labs  Lab 12/10/2018 2045  NA 140  K 3.8  CL 87*  CO2 29  GLUCOSE 129*  BUN 62*  CREATININE 2.99*  CALCIUM 8.1*  GFRNONAA 14*  GFRAA 16*  ANIONGAP 24*    Recent Labs  Lab 12/11/2018 2045  PROT 7.2  ALBUMIN 3.6  AST 1,975*  ALT 607*  ALKPHOS 81  BILITOT 3.0*   Hematology Recent Labs  Lab 11/28/2018 2045  WBC 12.9*  RBC 3.99  HGB 10.0*  HCT 34.0*  MCV 85.2  MCH 25.1*  MCHC 29.4*  RDW 18.5*  PLT 167   Cardiac Enzymes Recent Labs  Lab 12/02/2018 2045  12/24/18 0229 12/24/18 0721  TROPONINI 5.35* 5.56* 5.02*   No results for input(s): TROPIPOC in the last 168 hours.  BNP Recent Labs  Lab 11/29/2018 2049  BNP 2,346.0*    DDimer No results for input(s): DDIMER in the last 168 hours.  Radiology/Studies:  Dg Chest Portable 1 View  Result Date: 12/12/2018 CLINICAL DATA:  85 year old female shortness of breath and altered mental status. Abnormal pulmonary auscultation. EXAM: PORTABLE CHEST 1 VIEW COMPARISON:  10/28/2018 and earlier. FINDINGS: Portable AP semi upright view at 2049 hours. Stable lung volumes. Stable cardiomegaly and mediastinal contours. Stable left chest cardiac pacemaker. Calcified aortic atherosclerosis. Visualized tracheal air column is within normal limits. Increased pulmonary interstitial opacity, symmetric in both lungs. No pneumothorax. No pleural effusion or consolidation. No acute osseous abnormality identified. IMPRESSION: 1. Increased bilateral pulmonary interstitial opacity. Top differential considerations include acute pulmonary interstitial edema and viral/atypical respiratory infection. No pleural effusion. 2. Stable cardiomegaly. Aortic Atherosclerosis (ICD10-I70.0). Electronically Signed   By: Genevie Ann M.D.   On: 12/13/2018 21:17   US Abdomen Limited Ruq  Result Date: 12/24/2018 CLINICAL DATA:  Elevated liver function tests. Status post prior cholecystectomy. EXAM: ULTRASOUND ABDOMEN LIMITED RIGHT UPPER QUADRANT COMPARISON:  CT of the abdomen and pelvis on 10/28/2018 FINDINGS: Gallbladder: Surgically absent. Common bile duct: Diameter: 5 mm. Liver: The liver parenchyma is mildly heterogeneous and the surface contour may be very mildly nodular. Early cirrhosis cannot be excluded. No focal liver lesions or evidence of intrahepatic biliary ductal dilatation. Portal vein is patent on color Doppler imaging without thrombus and demonstrates normal direction of blood flow towards the liver. IMPRESSION: Mildly heterogeneous  liver parenchyma with suggestion of mild nodularity of the surface contour. Early cirrhosis cannot be excluded. Electronically Signed   By: Aletta Edouard M.D.   On: 12/24/2018 09:30    Assessment and Plan:   Acute on chronic HFpEF: Patient has some signs and symptoms of volume overload, though I am most concerned for right heart failure.  I have preliminarily reviewed her echocardiogram images performed today, which notes normal LV contraction.  Overall, the LV appears somewhat underfilled with evidence of pressure/volume overload of the right ventricle.  CVP is also elevated.  Patient is currently receiving torsemide, IV furosemide, and spironolactone.  Gentle diuresis.  Escalation of furosemide to 40 mg IV daily is recommended, given chronic kidney disease.  I will discontinue torsemide and spironolactone (already administered today).  Monitor renal function.  Continue metoprolol succinate 25 mg daily.  Given predominant right heart component and evidence of liver dysfunction, consider exclusion of pulmonary embolism.  In the setting of advanced CKD, VQ scan may need to be considered.  Coronary artery disease with elevated troponin: Patient's presentation is not consistent with acute coronary syndrome.  I favor troponin elevation representing supply-demand mismatch in the setting of diastolic heart failure, chronic kidney disease, and other acute illness that has precipitated multiorgan dysfunction (e.g. PE or infectious process).  Complete 24 to 48 hours of IV heparin.  No plan for cardiac catheterization at this time.  In the setting of acute liver injury, I recommend discontinuing atorvastatin and fenofibrate.  Continue aspirin 81 mg daily and metoprolol succinate 25 mg daily.  Acute liver failure and abdominal pain: Patient has evidence of transaminitis as well as elevated bilirubin and INR.  Etiologies include congestive hepatopathy from right heart failure, hepatic infarcts from  cardioembolic phenomena in the setting of permanent atrial fibrillation not on anticoagulation, viral hepatitis, medication side effect, or transient hypotension (though patient's blood pressure has been normal to elevated during this admission).  Right upper quadrant ultrasound shows mildly heterogeneous liver parenchyma with suggestion of mild nodularity that could be seen with early cirrhosis.  Discontinue atorvastatin and fenofibrate; avoid other potentially hepatotoxic drugs.  Consider GI consultation if LFT's do not improve.  Check lactic acid.  Permanent atrial fibrillation: Patient is in atrial fibrillation with chronic biventricular pacing.  She was previously taken off anticoagulation due to GI bleed.  She is on heparin at this time.  Continue heparin and aspirin for now.  Patient is not a good long-term candidate for anticoagulation due to medication noncompliance and history of GI bleed.  Acute on chronic kidney disease: Baseline creatinine appears to be around 2-2.5.  It was 3 on admission.  There is evidence of volume overload; I suspect venous congestion from right heart failure is contributing.  Gentle diuresis, as above.  Avoid nephrotoxic agents; will discontinue torsemide and spironolactone.  I would be hesitant to restart spironolactone in the future.  Shortness of breath and cough: Symptoms could be due to a number of  possibilities, including COPD exacerbation (though not wheezing) and acute on chronic HFpEF.  I worry that PE or infectious pneumonitis (including atypical agents such as COVID-19) could be contributing based on radiographic findings.  Gentle diuresis, as above.  Continue heparin until pulmonary embolism excluded.  Will defer work-up to internal medicine.  Consider further work-up for infectious pneumonitis, including sputum culture and COVID-19 testing.  For questions or updates, please contact [place] Please consult [email or web address] for contact  info under [facility].  Signed, Nelva Bush, MD  12/24/2018 9:50 AM

## 2018-12-24 NOTE — Progress Notes (Signed)
PT Cancellation Note  Patient Details Name: Regina Burke MRN: [id number] DOB: November 02, 1932   Cancelled Treatment:    Reason Eval/Treat Not Completed: Patient not medically ready Spoke with nurse who reports that pt is not ready for PT this AM, depending on the morning nurse reports she may be appropriate this afternoon.  Will re-assess the situation at that point and attempt as appropriate.  Kreg Shropshire, DPT 12/24/2018, 11:47 AM

## 2018-12-24 NOTE — Progress Notes (Addendum)
Patient finally resting. Covid-19 results came back negative. Dr. Posey Pronto notified, orders to discontinue isolation precautions. Will continue to monitor patient.  Both son's (Troy and Grandy) were updated per patient request.

## 2018-12-24 NOTE — Progress Notes (Signed)
ANTICOAGULATION CONSULT NOTE - Initial Consult  Pharmacy Consult for Heparin  Indication: chest pain/ACS  Allergies  Allergen Reactions  . Ciprofloxacin Shortness Of Breath, Itching and Rash  . Doxycycline Shortness Of Breath, Itching and Rash  . Penicillins Shortness Of Breath, Itching, Rash and Other (See Comments)    Has patient had a PCN reaction causing immediate rash, facial/tongue/throat swelling, SOB or lightheadedness with hypotension: Yes Has patient had a PCN reaction causing severe rash involving mucus membranes or skin necrosis: No Has patient had a PCN reaction that required hospitalization No Has patient had a PCN reaction occurring within the last 10 years: No If all of the above answers are "NO", then may proceed with Cephalosporin use.  . Sulfa Antibiotics Shortness Of Breath, Itching and Rash  . Latex Itching  . Morphine And Related Itching  . Albuterol Sulfate   . Macrobid [Nitrofurantoin Macrocrystal] Itching  . Cefuroxime Rash    Blisters in mouth    Patient Measurements: Height: 5\' 6"  (167.6 cm) Weight: 176 lb 5.9 oz (80 kg) IBW/kg (Calculated) : 59.3 Heparin Dosing Weight:  75.9 kg   Vital Signs: Temp: 98.3 F (36.8 C) (04/28 2013) Temp Source: Oral (04/28 2013) BP: 128/48 (04/28 2013) Pulse Rate: 68 (04/28 2013)  Labs: Recent Labs    12/14/2018 2045 12/24/18 0229 12/24/18 0721 12/24/18 1954  HGB 10.0*  --   --   --   HCT 34.0*  --   --   --   PLT 167  --   --   --   APTT 44*  --   --   --   LABPROT 19.6*  --   --   --   INR 1.7*  --   --   --   HEPARINUNFRC  --   --  <0.10* <0.10*  CREATININE 2.99*  --   --   --   CKTOTAL 470*  --   --   --   TROPONINI 5.35* 5.56* 5.02*  --     Estimated Creatinine Clearance: 15.2 mL/min (A) (by C-G formula based on SCr of 2.99 mg/dL (H)).   Medical History: Past Medical History:  Diagnosis Date  . Anemia of chronic disease   . Arthritis   . Asthma   . Cardiac resynchronization therapy pacemaker  (CRT-P) in place    a. 03/31/16:  Medtronic Percepta Quad CRT-P MRI SureScan (serial Number [id number]) device.  . Chronic diastolic CHF (congestive heart failure) ([place])    a. 10/2015 Echo: EF 55-65%, Gr1 DD, mild MR, mildly dil LA, nl RV fxn, nl PASP;  b. 03/2016 Echo: EF 30-35%, mod MR, mod dil LA; c. 12/2016 Echo: EF 60-65%, no rwma, mild AS, mildly dil LA, PASP 56mmHg.  . CKD (chronic kidney disease), stage IV ([place])   . COPD (chronic obstructive pulmonary disease) ([place])   . Coronary artery disease    a. 11/2014 NSTEMI/PCI: LM nl, LAD 60m, D1 30, LCX mild dzs, OM1 20p, OM2 34m, OM3 90p (2.25x8 Promus Premier DES), RCA nl.   . Cough    CHRONIC AT NIGHT  . Cushing's disease ([place])   . Depression   . Diverticulitis   . Edema    FEET/LEGS  . GERD (gastroesophageal reflux disease)   . Gout   . History of hiatal hernia   . History of pneumonia   . HLD (hyperlipidemia)   . HOH (hard of hearing)   . Hypertension   . Hypertensive heart disease   .  Lung cancer ([place]) dx'd 2014   S/P radiation 2015  . Migraine   . Mixed Ischemic and Nonischemic Cardiomyopathy ([place])    a. 03/2016 Echo: EF 30-35%;  b. 12/2016 Echo: EF 60-65%.  . Moderate mitral regurgitation   . Multiple allergies   . Myocardial infarction ([place])   . Oxygen deficiency    2LITERS  . Persistent atrial fibrillation    a. CHADS2VASc ==> 7 (CHF, HTN, age x 2, DM, vascular disease, and gender)--was on renal dosed eliquis but this was d/c'd in 12/2016 in setting of dark stools/anemia;  c. 03/2016 s/p AVN and MDT BiV ICD placement.  . Presence of permanent cardiac pacemaker   . S/P AV nodal ablation    a. on 03/31/16 for persistant afib with CRT-P placement  . Sleep apnea   . Type II diabetes mellitus (HCC)     Medications:  Medications Prior to Admission  Medication Sig Dispense Refill Last Dose  . ALPRAZolam (XANAX) 0.5 MG tablet Take 0.5 mg by mouth at bedtime as needed for sleep.    12/22/2018 at prn  . atorvastatin (LIPITOR) 20 MG  tablet Take 20 mg by mouth daily.   12/22/2018 at Unknown  . busPIRone (BUSPAR) 7.5 MG tablet Take 7.5 mg by mouth 2 (two) times daily.   12/22/2018 at Unknown  . fenofibrate (TRICOR) 48 MG tablet Take 48 mg by mouth daily.    12/22/2018 at Unknown  . levalbuterol (XOPENEX) 1.25 MG/0.5ML nebulizer solution Take 1.25 mg by nebulization every 6 (six) hours as needed for wheezing or shortness of breath. 1 each 12 prn at prn  . metoprolol succinate (TOPROL XL) 25 MG 24 hr tablet Take 25 mg by mouth daily.    12/22/2018 at Unknown  . ondansetron (ZOFRAN-ODT) 4 MG disintegrating tablet Take 1 tablet by mouth every 8 (eight) hours as needed.    prn at prn  . sertraline (ZOLOFT) 50 MG tablet Take 1 tablet (50 mg total) by mouth daily. 30 tablet 2 11/28/2018 at Unknown  . spironolactone (ALDACTONE) 25 MG tablet Take 25 mg by mouth daily.  3 12/22/2018 at Unknown  . torsemide (DEMADEX) 20 MG tablet Take 40 mg by mouth daily.  3 12/22/2018 at Unknown  . traMADol (ULTRAM) 50 MG tablet Take 50 mg by mouth 2 (two) times daily.   3 12/22/2018 at Unknown time  . Aclidinium Bromide 400 MCG/ACT AEPB Inhale into the lungs.   Taking  . feeding supplement, ENSURE ENLIVE, (ENSURE ENLIVE) LIQD Take 237 mLs by mouth 3 (three) times daily with meals. 237 mL 12 Taking  . FERREX 150 150 MG capsule Take 150 mg by mouth daily.   Not Taking at Unknown time  . fluticasone furoate-vilanterol (BREO ELLIPTA) 100-25 MCG/INH AEPB Inhale 1 puff into the lungs daily.    Not Taking at Unknown  . ipratropium (ATROVENT) 0.06 % nasal spray USE 2 SPRAY(S) IN EACH NOSTRIL TWICE DAILY   Not Taking at Unknown time  . nitroGLYCERIN (NITROSTAT) 0.4 MG SL tablet Place 1 tablet (0.4 mg total) under the tongue every 5 (five) minutes as needed for chest pain. (Patient not taking: Reported on 12/24/2018) 25 tablet 3 Not Taking at prn  . tiotropium (SPIRIVA) 18 MCG inhalation capsule Place 18 mcg into inhaler and inhale daily as needed (shortness of breath).     Not Taking at Unknown  . vitamin B-12 (CYANOCOBALAMIN) 1000 MCG tablet Take 1 tablet by mouth daily.   Not Taking at Unknown time  Assessment: Pharmacy was consulted for heparin infusion dosing and monitoring in 85 yo female admitted for ACS/STEMI. No reported anticoagulants PTA. Baseline Hgb 10.0, PLT 167, INR elevated at 1.7 (noted h/o hepatic failure), aPTT 46s.   Goal of Therapy:  Heparin level 0.3-0.7 units/ml Monitor platelets by anticoagulation protocol: Yes   Heparin Course: 4/28 AM initiation: 4000 unit bolus, then 900 units/hr 4/28 0721 HL < 0.10  Plan:  --Heparin level undetectable: I spoke with Illene Bolus who tells me there has been no interruption in therapy and no IV leak --Give 2000 units bolus x 1 --Increase heparin infusion rate to 1200 units/hr --Check anti-Xa level in 8 hours and daily while on heparin --Continue to monitor H&H and platelets  4/28:  HL @ 2000 = < 0.1 Will order Heparin 2300 units IV X 1 bolus and increase drip rate to 1500 units/hr.  Will recheck HL 8 hrs after rate change.  Iverson Sees D 12/24/2018,9:35 PM

## 2018-12-24 NOTE — Procedures (Signed)
[facility] Pacemaker Interrogation  Date: 12/24/18 Time: 4:05 PM  Device: Medtronic Percepta Quad CRT-P Mode: VVIR Lower rate: 70 bpm Upper sensor: 110 bpm VT monitor: 150 bpm  Impedence: RV 494, LV 494 Capture threshold: RV 0.625 V @ 0.40 ms Pulse amplitude/width: RV 2.5 V/0.40 ms, LV 2.5 V/1.5 ms Sensitivity: RV 4.00 mV  Events: None.  VP: 100%  OptiVol Alert: Fluid accumulation ongoing sinice 12/16/2018.  No programming changes made.  Nelva Bush, MD [facility] Pager: [phone]

## 2018-12-24 NOTE — Progress Notes (Signed)
*  PRELIMINARY RESULTS* Echocardiogram 2D Echocardiogram has been performed.  Regina Burke 12/24/2018, 8:17 AM

## 2018-12-24 NOTE — H&P (Signed)
[place] [facility] [place] NAME: Regina Burke    MR#:  [id number]  DATE OF BIRTH:  1933/07/04  DATE OF ADMISSION:  12/02/2018  PRIMARY CARE PHYSICIAN: Tracie Harrier, MD   REQUESTING/REFERRING PHYSICIAN: Carrie Mew, MD  CHIEF COMPLAINT:   Chief Complaint  Patient presents with  . Altered Mental Status  . Shortness of Breath    HISTORY OF PRESENT ILLNESS:  Regina Burke  is a 85 y.o. female with a known history of CHF, coronary artery disease, and stage III renal failure.  She was brought to the emergency room via EMS services with report of altered mental status and increase shortness of breath over the last 2 days as well as nausea, vomiting, and diarrhea according to her family.  At the time I am seeing the patient, she is awake alert and oriented x4.  She reports usually being able to ambulate with walker assistance.  However over the last 2 days she has been increasingly short of breath with a nonproductive cough as well as increased weakness.  She denies experiencing chest pain.  However she has noted nausea however she denies vomiting or diarrhea.  Family had reported both vomiting and diarrhea.  She denies abdominal pain.  She denies fevers or chills.  She has an extensive cardiac history and sees Dr. Felipa Emory.  On arrival to the emergency room patient had elevated troponin of 5.35.  As stated above, patient denies having complaints of chest pain or abdominal pain.  She was started on heparin infusion in the emergency room after cardiology consultation.  Also noted to have elevated BNP 2346 with creatinine 2.99 and BUN 62.  Additionally, LFTs are elevated with AST 1975 and ALT 607.  Bilirubin is 3.0.  Patient has no prior history of cirrhosis.  She has history of cholecystectomy.  She has been mated to the hospital service for N STEMI, acute on chronic CHF, acute on chronic renal failure, and elevated LFTs.  PAST MEDICAL HISTORY:    Past Medical History:  Diagnosis Date  . Anemia of chronic disease   . Arthritis   . Asthma   . Cardiac resynchronization therapy pacemaker (CRT-P) in place    a. 03/31/16:  Medtronic Percepta Quad CRT-P MRI SureScan (serial Number [id number]) device.  . Chronic diastolic CHF (congestive heart failure) ([place])    a. 10/2015 Echo: EF 55-65%, Gr1 DD, mild MR, mildly dil LA, nl RV fxn, nl PASP;  b. 03/2016 Echo: EF 30-35%, mod MR, mod dil LA; c. 12/2016 Echo: EF 60-65%, no rwma, mild AS, mildly dil LA, PASP 12mmHg.  . CKD (chronic kidney disease), stage IV ([place])   . COPD (chronic obstructive pulmonary disease) ([place])   . Coronary artery disease    a. 11/2014 NSTEMI/PCI: LM nl, LAD 57m, D1 30, LCX mild dzs, OM1 20p, OM2 13m, OM3 90p (2.25x8 Promus Premier DES), RCA nl.   . Cough    CHRONIC AT NIGHT  . Cushing's disease ([place])   . Depression   . Diverticulitis   . Edema    FEET/LEGS  . GERD (gastroesophageal reflux disease)   . Gout   . History of hiatal hernia   . History of pneumonia   . HLD (hyperlipidemia)   . HOH (hard of hearing)   . Hypertension   . Hypertensive heart disease   . Lung cancer ([place]) dx'd 2014   S/P radiation 2015  . Migraine   . Mixed Ischemic and Nonischemic  Cardiomyopathy ([place])    a. 03/2016 Echo: EF 30-35%;  b. 12/2016 Echo: EF 60-65%.  . Moderate mitral regurgitation   . Multiple allergies   . Myocardial infarction ([place])   . Oxygen deficiency    2LITERS  . Persistent atrial fibrillation    a. CHADS2VASc ==> 7 (CHF, HTN, age x 2, DM, vascular disease, and gender)--was on renal dosed eliquis but this was d/c'd in 12/2016 in setting of dark stools/anemia;  c. 03/2016 s/p AVN and MDT BiV ICD placement.  . Presence of permanent cardiac pacemaker   . S/P AV nodal ablation    a. on 03/31/16 for persistant afib with CRT-P placement  . Sleep apnea   . Type II diabetes mellitus ([place])     PAST SURGICAL HISTORY:   Past Surgical History:  Procedure Laterality Date  .  ABDOMINAL HYSTERECTOMY    . ABLATION     July 2017  . ADRENALECTOMY Left 1980's   "[place]"  . APPENDECTOMY    . BREAST CYST EXCISION Left   . CATARACT EXTRACTION W/PHACO Right 12/28/2015   Procedure: CATARACT EXTRACTION PHACO AND INTRAOCULAR LENS PLACEMENT (IOC);  Surgeon: Birder Robson, MD;  Location: [facility] ORS;  Service: Ophthalmology;  Laterality: Right;  [place] 48.4   . CATARACT EXTRACTION W/PHACO Left 11/14/2016   Procedure: CATARACT EXTRACTION PHACO AND INTRAOCULAR LENS PLACEMENT (IOC);  Surgeon: Birder Robson, MD;  Location: [facility];  Service: Ophthalmology;  Laterality: Left;  [place] 59.8 AP% 18.1 CDE 10.78 Fluid pack lot # [id number]  . CHOLECYSTECTOMY    . CORONARY ANGIOPLASTY WITH STENT PLACEMENT  12/23/2014  . ELECTROPHYSIOLOGIC STUDY N/A 03/08/2016   Procedure: CARDIOVERSION;  Surgeon: Wende Bushy, MD;  Location: [facility];  Service: Cardiovascular;  Laterality: N/A;  . ELECTROPHYSIOLOGIC STUDY N/A 03/07/2016   Procedure: Cardioversion;  Surgeon: Wende Bushy, MD;  Location: [facility];  Service: Cardiovascular;  Laterality: N/A;  . ELECTROPHYSIOLOGIC STUDY N/A 03/31/2016   Procedure: AV Node Ablation;  Surgeon: Will Meredith Leeds, MD;  Location: [facility];  Service: Cardiovascular;  Laterality: N/A;  . EP IMPLANTABLE DEVICE N/A 03/31/2016   Procedure: BiV Pacemaker Insertion CRT-P;  Surgeon: Will Meredith Leeds, MD;  Location: [facility];  Service: Cardiovascular;  Laterality: N/A;  . ESOPHAGOGASTRODUODENOSCOPY (EGD) WITH PROPOFOL N/A 01/26/2017   Procedure: ESOPHAGOGASTRODUODENOSCOPY (EGD) WITH PROPOFOL;  Surgeon: [name], MD;  Location: [facility];  Service: Endoscopy;  Laterality: N/A;  . FRACTURE SURGERY    . HIP PINNING,CANNULATED Left 02/01/2017   Procedure: CANNULATED HIP PINNING;  Surgeon: Lovell Sheehan, MD;  Location: [facility];  Service: Orthopedics;  Laterality: Left;  . INSERT / REPLACE / REMOVE PACEMAKER  02/2016  . LEFT HEART CATHETERIZATION WITH  CORONARY ANGIOGRAM N/A 12/23/2014   Procedure: LEFT HEART CATHETERIZATION WITH CORONARY ANGIOGRAM;  Surgeon: Burnell Blanks, MD;  Location: [facility] C[facility] LAB;  Service: Cardiovascular;  Laterality: N/A;  . PERCUTANEOUS CORONARY STENT INTERVENTION (PCI-S)  12/23/2014   Procedure: PERCUTANEOUS CORONARY STENT INTERVENTION (PCI-S);  Surgeon: Burnell Blanks, MD;  Location: [facility] C[facility] LAB;  Service: Cardiovascular;;  Promus 2.25x8  . TONSILLECTOMY    . TRANSTHORACIC ECHOCARDIOGRAM  11/26/2015   Technically difficult study. EF 55-60%. Normal wall motion. GR 1 DD.  . TUBAL LIGATION    . WRIST FRACTURE SURGERY Bilateral ~ 2000    SOCIAL HISTORY:   Social History   Tobacco Use  . Smoking status: Former Smoker    Packs/day: 1.00    Years: 45.00  Pack years: 45.00    Types: Cigarettes    Last attempt to quit: 04/25/1994    Years since quitting: 24.6  . Smokeless tobacco: Never Used  Substance Use Topics  . Alcohol use: No    Comment: 12/23/2014 "might have a couple mixed drinks/year"    FAMILY HISTORY:   Family History  Problem Relation Age of Onset  . Heart disease Mother   . Diabetes Mother   . Osteoarthritis Mother   . Hypertension Mother   . Heart disease Father   . Hypertension Father   . COPD Brother     DRUG ALLERGIES:   Allergies  Allergen Reactions  . Ciprofloxacin Shortness Of Breath, Itching and Rash  . Doxycycline Shortness Of Breath, Itching and Rash  . Penicillins Shortness Of Breath, Itching, Rash and Other (See Comments)    Has patient had a PCN reaction causing immediate rash, facial/tongue/throat swelling, SOB or lightheadedness with hypotension: Yes Has patient had a PCN reaction causing severe rash involving mucus membranes or skin necrosis: No Has patient had a PCN reaction that required hospitalization No Has patient had a PCN reaction occurring within the last 10 years: No If all of the above answers are "NO", then may proceed with Cephalosporin  use.  . Sulfa Antibiotics Shortness Of Breath, Itching and Rash  . Latex Itching  . Morphine And Related Itching  . Albuterol Sulfate   . Macrobid [Nitrofurantoin Macrocrystal] Itching  . Cefuroxime Rash    Blisters in mouth    REVIEW OF SYSTEMS:   Review of Systems  Constitutional: Positive for malaise/fatigue. Negative for chills and fever.  HENT: Positive for congestion. Negative for sinus pain.   Eyes: Negative for blurred vision and pain.  Respiratory: Positive for cough and shortness of breath.   Cardiovascular: Positive for leg swelling. Negative for chest pain and palpitations.  Gastrointestinal: Positive for nausea and vomiting. Negative for abdominal pain.  Genitourinary: Negative for dysuria.  Musculoskeletal: Negative for myalgias and neck pain.  Skin: Negative for itching and rash.  Neurological: Positive for weakness. Negative for headaches.     MEDICATIONS AT HOME:   Prior to Admission medications   Medication Sig Start Date End Date Taking? Authorizing Provider  ALPRAZolam Duanne Moron) 0.5 MG tablet Take 0.5 mg by mouth at bedtime as needed for sleep.  12/27/16  Yes [provider]  atorvastatin (LIPITOR) 20 MG tablet Take 20 mg by mouth daily.   Yes [provider]  busPIRone (BUSPAR) 7.5 MG tablet Take 7.5 mg by mouth 2 (two) times daily.   Yes [provider]  fenofibrate (TRICOR) 48 MG tablet Take 48 mg by mouth daily.    Yes [provider]  levalbuterol (XOPENEX) 1.25 MG/0.5ML nebulizer solution Take 1.25 mg by nebulization every 6 (six) hours as needed for wheezing or shortness of breath. 03/02/17  Yes Fritzi Mandes, MD  metoprolol succinate (TOPROL XL) 25 MG 24 hr tablet Take 25 mg by mouth daily.  12/19/16  Yes Gollan, Kathlene November, MD  ondansetron (ZOFRAN-ODT) 4 MG disintegrating tablet Take 1 tablet by mouth every 8 (eight) hours as needed.    Yes [provider]  sertraline (ZOLOFT) 50 MG tablet Take 1 tablet (50 mg total)  by mouth daily. 03/24/16  Yes Gladstone Lighter, MD  spironolactone (ALDACTONE) 25 MG tablet Take 25 mg by mouth daily. 06/20/18  Yes [provider]  torsemide (DEMADEX) 20 MG tablet Take 40 mg by mouth daily. 06/27/18  Yes [provider]  traMADol (ULTRAM) 50 MG tablet Take 50 mg by mouth 2 (two) times daily.  03/06/18  Yes [provider]  Aclidinium Bromide 400 MCG/ACT AEPB Inhale into the lungs. 06/13/18 12/10/18  [provider]  feeding supplement, ENSURE ENLIVE, (ENSURE ENLIVE) LIQD Take 237 mLs by mouth 3 (three) times daily with meals. 03/02/17   Fritzi Mandes, MD  FERREX 150 150 MG capsule Take 150 mg by mouth daily. 10/02/18   [provider]  fluticasone furoate-vilanterol (BREO ELLIPTA) 100-25 MCG/INH AEPB Inhale 1 puff into the lungs daily.     [provider]  ipratropium (ATROVENT) 0.06 % nasal spray USE 2 SPRAY(S) IN EACH NOSTRIL TWICE DAILY 10/02/18   [provider]  nitroGLYCERIN (NITROSTAT) 0.4 MG SL tablet Place 1 tablet (0.4 mg total) under the tongue every 5 (five) minutes as needed for chest pain. Patient not taking: Reported on 12/24/2018 12/24/14   Barrett, Evelene Croon, PA-C  tiotropium (SPIRIVA) 18 MCG inhalation capsule Place 18 mcg into inhaler and inhale daily as needed (shortness of breath).     [provider]  vitamin B-12 (CYANOCOBALAMIN) 1000 MCG tablet Take 1 tablet by mouth daily. 12/22/2018 12/23/19  [provider]      VITAL SIGNS:  Blood pressure (!) 155/59, pulse 71, temperature 98.9 F (37.2 C), temperature source Oral, resp. rate 18, height 5\' 6"  (1.676 m), weight 80 kg, SpO2 100 %.  PHYSICAL EXAMINATION:  Physical Exam Constitutional:      General: She is in acute distress.     Appearance: She is ill-appearing.  HENT:     Head: Normocephalic and atraumatic.     Mouth/Throat:     Mouth: Mucous membranes are moist.     Pharynx: Oropharynx is clear.  Eyes:     Pupils: Pupils are  equal, round, and reactive to light.  Neck:     Musculoskeletal: Normal range of motion and neck supple.  Cardiovascular:     Rate and Rhythm: Normal rate and regular rhythm.     Pulses: Normal pulses.     Heart sounds: Murmur present.  Pulmonary:     Breath sounds: Examination of the right-lower field reveals rhonchi and rales. Examination of the left-lower field reveals rhonchi and rales. Rhonchi and rales present.  Chest:     Chest wall: No mass or tenderness.  Abdominal:     General: Bowel sounds are normal.     Palpations: Abdomen is soft. There is no mass.     Tenderness: There is no abdominal tenderness.  Musculoskeletal:     Right lower leg: Edema present.     Left lower leg: Edema present.  Skin:    General: Skin is warm and dry.     Capillary Refill: Capillary refill takes less than 2 seconds.     Findings: No rash.  Neurological:     General: No focal deficit present.     Mental Status: She is alert.     Motor: Weakness present.  Psychiatric:        Mood and Affect: Mood normal.     LABORATORY PANEL:   CBC Recent Labs  Lab 11/30/2018 2045  WBC 12.9*  HGB 10.0*  HCT 34.0*  PLT 167   ------------------------------------------------------------------------------------------------------------------  Chemistries  Recent Labs  Lab 12/07/2018 2045  NA 140  K 3.8  CL 87*  CO2 29  GLUCOSE 129*  BUN 62*  CREATININE 2.99*  CALCIUM 8.1*  MG 2.2  AST 1,975*  ALT 607*  ALKPHOS 81  BILITOT 3.0*   ------------------------------------------------------------------------------------------------------------------  Cardiac Enzymes Recent Labs  Lab 12/20/2018 2045  TROPONINI 5.35*   ------------------------------------------------------------------------------------------------------------------  RADIOLOGY:  Dg Chest Portable 1 View  Result Date: 12/18/2018 CLINICAL DATA:  85 year old female shortness of breath and altered mental status. Abnormal pulmonary  auscultation. EXAM: PORTABLE CHEST 1 VIEW COMPARISON:  10/28/2018 and earlier. FINDINGS: Portable AP semi upright view at 2049 hours. Stable lung volumes. Stable cardiomegaly and mediastinal contours. Stable left chest cardiac pacemaker. Calcified aortic atherosclerosis. Visualized tracheal air column is within normal limits. Increased pulmonary interstitial opacity, symmetric in both lungs. No pneumothorax. No pleural effusion or consolidation. No acute osseous abnormality identified. IMPRESSION: 1. Increased bilateral pulmonary interstitial opacity. Top differential considerations include acute pulmonary interstitial edema and viral/atypical respiratory infection. No pleural effusion. 2. Stable cardiomegaly. Aortic Atherosclerosis (ICD10-I70.0). Electronically Signed   By: Genevie Ann M.D.   On: 12/16/2018 21:17      IMPRESSION AND PLAN:   1. non-STEMI - Cardiology consulted for further evaluation and recommendations - We will continue to trend troponin levels - Patient is on telemetry monitoring - Started on heparin infusion -  echocardiogram in the a.m. -Repeat EKG in the a.m.  2.  Acute on chronic diastolic CHF - Patient had 20 mg IV Lasix in the emergency room.  We will continue 20 mg IV Lasix daily. - Monitor renal function closely with history of stage III CKD - Cardiology has been consulted as stated above - Echocardiogram ordered  3. acute on chronic renal failure - Creatinine 2.99 BUN 62.  Most likely secondary to acute on chronic CHF.  We will continue to monitor renal function closely as patient continues to receive diuretic therapy for acute on chronic CHF. -May need to consider nephrology consult if no improvement or if worse.  4.  Elevated liver function studies - With recent nausea, vomiting, and diarrhea according to patient's family. - We will get right upper quadrant ultrasound in the a.m. and follow-up results. - We will repeat LFTs in the a.m.   All the records are  reviewed and case discussed with ED provider. The plan of care was discussed in details with the patient (and family). I answered all questions. The patient agreed to proceed with the above mentioned plan. Further management will depend upon hospital course.   CODE STATUS: Full code  TOTAL TIME TAKING CARE OF THIS PATIENT: 45 minutes.    [place] on 12/24/2018 at 2:35 AM  Pager - [phone]  After 6pm go to [email or web address] - Proofreader  [facility]  Office  [phone]  CC: Primary care physician; Tracie Harrier, MD   Note: This dictation was prepared with Dragon dictation along with smaller phrase technology. Any transcriptional errors that result from this process are unintentional.

## 2018-12-24 NOTE — Progress Notes (Signed)
PT Cancellation Note  Patient Details Name: Regina Burke MRN: [id number] DOB: 04-15-1933   Cancelled Treatment:    Reason Eval/Treat Not Completed: Patient declined, no reason specified Spoke with nursing about seeing pt this afternoon (post (-) covid results).  Apparently when nursing mentioned work with PT this afternoon she says "Absolutely not" as she is too tired and not feeling like she can do anything.  Will hold today, but pt aware that she needs to be getting up with PT tomorrow.     Kreg Shropshire, DPT 12/24/2018, 2:29 PM

## 2018-12-24 NOTE — Progress Notes (Signed)
[place] [facility] [place] NAME: Regina Burke    MR#:  [id number]  DATE OF BIRTH:  1933-01-17  SUBJECTIVE:  patient came in with increasing shortness of breath and cough a couple weeks. Denies any chest pain. No fever.  REVIEW OF SYSTEMS:   Review of Systems  Constitutional: Negative for chills, fever and weight loss.  HENT: Negative for ear discharge, ear pain and nosebleeds.   Eyes: Negative for blurred vision, pain and discharge.  Respiratory: Positive for cough and shortness of breath. Negative for sputum production, wheezing and stridor.   Cardiovascular: Negative for chest pain, palpitations, orthopnea and PND.  Gastrointestinal: Negative for abdominal pain, diarrhea, nausea and vomiting.  Genitourinary: Negative for frequency and urgency.  Musculoskeletal: Negative for back pain and joint pain.  Neurological: Positive for weakness. Negative for sensory change, speech change and focal weakness.  Psychiatric/Behavioral: Negative for depression and hallucinations. The patient is not nervous/anxious.    Tolerating Diet:yes Tolerating PT: pending  DRUG ALLERGIES:   Allergies  Allergen Reactions  . Ciprofloxacin Shortness Of Breath, Itching and Rash  . Doxycycline Shortness Of Breath, Itching and Rash  . Penicillins Shortness Of Breath, Itching, Rash and Other (See Comments)    Has patient had a PCN reaction causing immediate rash, facial/tongue/throat swelling, SOB or lightheadedness with hypotension: Yes Has patient had a PCN reaction causing severe rash involving mucus membranes or skin necrosis: No Has patient had a PCN reaction that required hospitalization No Has patient had a PCN reaction occurring within the last 10 years: No If all of the above answers are "NO", then may proceed with Cephalosporin use.  . Sulfa Antibiotics Shortness Of Breath, Itching and Rash  . Latex Itching  . Morphine And Related Itching  . Albuterol  Sulfate   . Macrobid [Nitrofurantoin Macrocrystal] Itching  . Cefuroxime Rash    Blisters in mouth    VITALS:  Blood pressure (!) 157/55, pulse 72, temperature 97.7 F (36.5 C), resp. rate 18, height 5\' 6"  (1.676 m), weight 80 kg, SpO2 94 %.  PHYSICAL EXAMINATION:   Physical Exam  GENERAL:  85 y.o.-year-old patient lying in the bed with no acute distress. Overall weak EYES: Pupils equal, round, reactive to light and accommodation. No scleral icterus. Extraocular muscles intact.  HEENT: Head atraumatic, normocephalic. Oropharynx and nasopharynx clear.  NECK:  Supple, no jugular venous distention. No thyroid enlargement, no tenderness.  LUNGS:decreased breath sounds bilaterally, no wheezing, rales, rhonchi. No use of accessory muscles of respiration.  CARDIOVASCULAR: S1, S2 normal. No murmurs, rubs, or gallops.  ABDOMEN: Soft, nontender, nondistended. Bowel sounds present. No organomegaly or mass.  EXTREMITIES: No cyanosis, clubbing  ++ edema b/l.    NEUROLOGIC: Cranial nerves II through XII are intact. No focal Motor or sensory deficits b/l.   PSYCHIATRIC:  patient is alert and oriented x 3.  SKIN: No obvious rash, lesion, or ulcer.   LABORATORY PANEL:  CBC Recent Labs  Lab 11/29/2018 2045  WBC 12.9*  HGB 10.0*  HCT 34.0*  PLT 167    Chemistries  Recent Labs  Lab 12/13/2018 2045  NA 140  K 3.8  CL 87*  CO2 29  GLUCOSE 129*  BUN 62*  CREATININE 2.99*  CALCIUM 8.1*  MG 2.2  AST 1,975*  ALT 607*  ALKPHOS 81  BILITOT 3.0*   Cardiac Enzymes Recent Labs  Lab 12/24/18 0721  TROPONINI 5.02*   RADIOLOGY:  Dg Chest Portable 1 View  Result  Date: 12/04/2018 CLINICAL DATA:  85 year old female shortness of breath and altered mental status. Abnormal pulmonary auscultation. EXAM: PORTABLE CHEST 1 VIEW COMPARISON:  10/28/2018 and earlier. FINDINGS: Portable AP semi upright view at 2049 hours. Stable lung volumes. Stable cardiomegaly and mediastinal contours. Stable left  chest cardiac pacemaker. Calcified aortic atherosclerosis. Visualized tracheal air column is within normal limits. Increased pulmonary interstitial opacity, symmetric in both lungs. No pneumothorax. No pleural effusion or consolidation. No acute osseous abnormality identified. IMPRESSION: 1. Increased bilateral pulmonary interstitial opacity. Top differential considerations include acute pulmonary interstitial edema and viral/atypical respiratory infection. No pleural effusion. 2. Stable cardiomegaly. Aortic Atherosclerosis (ICD10-I70.0). Electronically Signed   By: Genevie Ann M.D.   On: 12/03/2018 21:17   US Abdomen Limited Ruq  Result Date: 12/24/2018 CLINICAL DATA:  Elevated liver function tests. Status post prior cholecystectomy. EXAM: ULTRASOUND ABDOMEN LIMITED RIGHT UPPER QUADRANT COMPARISON:  CT of the abdomen and pelvis on 10/28/2018 FINDINGS: Gallbladder: Surgically absent. Common bile duct: Diameter: 5 mm. Liver: The liver parenchyma is mildly heterogeneous and the surface contour may be very mildly nodular. Early cirrhosis cannot be excluded. No focal liver lesions or evidence of intrahepatic biliary ductal dilatation. Portal vein is patent on color Doppler imaging without thrombus and demonstrates normal direction of blood flow towards the liver. IMPRESSION: Mildly heterogeneous liver parenchyma with suggestion of mild nodularity of the surface contour. Early cirrhosis cannot be excluded. Electronically Signed   By: Aletta Edouard M.D.   On: 12/24/2018 09:30   ASSESSMENT AND PLAN:  [name]  is a 85 y.o. female with a known history of CHF, coronary artery disease, and stage III renal failure.  She was brought to the emergency room via EMS services with report of altered mental status and increase shortness of breath over the last 2 days as well as nausea, vomiting, and diarrhea according to her family.   1. acute non-STEMI/ vs demand ischemia in the setting of CHF - Cardiology consulted with  Dr End noted--recommends cont IV heparin gtt, consider V/Q scan in am if SOB doe not improve - We will continue to trend troponin levels - on heparin infusion -  echocardiogram pending -cont cardiac meds  2.  Acute on chronic diastolic-systolic CHF -came in with sob, weight gain and swelling all over - cont 40 mg IV Lasix daily. - Monitor renal function closely with history of stage III CKD - Cardiology has been consulted as stated above - Echocardiogram ordered  3. acute on chronic renal failure - Creatinine 2.99 BUN 62.  - Most likely secondary to acute on chronic CHF.   -We will continue to monitor renal function closely as patient continues to receive diuretic therapy for acute on chronic CHF. -May need to consider nephrology consult if no improvement or if worse.  4.  Elevated liver function studies appears hepatic congestion with CHF - With recent nausea, vomiting, and diarrhea according to patient's family. - USG abdomen Mildly heterogeneous liver parenchyma with suggestion of mild nodularity of the surface contour. Early cirrhosis cannot be excluded.  5.Pt is COVID negative    Case discussed with Care Management/Social Worker. Management plans discussed with the patient,  and they are in agreement.  CODE STATUS: Full  DVT Prophylaxis: heparin gtt  TOTAL TIME TAKING CARE OF THIS PATIENT: *30* minutes.  >50% time spent on counselling and coordination of care  POSSIBLE D/C IN *2-3* DAYS, DEPENDING ON CLINICAL CONDITION.  Note: This dictation was prepared with Dragon dictation along with smaller  Company secretary. Any transcriptional errors that result from this process are unintentional.  Fritzi Mandes M.D on 12/24/2018 at 2:14 PM  Between 7am to 6pm - Pager - [phone]  After 6pm go to [email or web address] - Proofreader  [facility]  Office  [phone]  CC: Primary care physician; Tracie Harrier, MDPatient ID: Sela Hilding, female    DOB: 1933-08-26, 85 y.o.   MRN: [id number]

## 2018-12-24 NOTE — Consult Note (Signed)
ANTICOAGULATION CONSULT NOTE  Pharmacy Consult for Heparin Infusion  Indication: chest pain/ACS  Patient Measurements: Height: 5\' 6"  (167.6 cm) Weight: 176 lb 5.9 oz (80 kg) IBW/kg (Calculated) : 59.3 Heparin Dosing Weight: 75.9   Vital Signs: Temp: 98.7 F (37.1 C) (04/28 0453) Temp Source: Oral (04/28 0049) BP: 132/54 (04/28 0453) Pulse Rate: 70 (04/28 0453)  Labs: Recent Labs    11/28/2018 2045 12/24/18 0229  HGB 10.0*  --   HCT 34.0*  --   PLT 167  --   APTT 44*  --   LABPROT 19.6*  --   INR 1.7*  --   CREATININE 2.99*  --   CKTOTAL 470*  --   TROPONINI 5.35* 5.56*    Estimated Creatinine Clearance: 15.2 mL/min (A) (by C-G formula based on SCr of 2.99 mg/dL (H)).   Medical History: Past Medical History:  Diagnosis Date  . Anemia of chronic disease   . Arthritis   . Asthma   . Cardiac resynchronization therapy pacemaker (CRT-P) in place    a. 03/31/16:  Medtronic Percepta Quad CRT-P MRI SureScan (serial Number [id number]) device.  . Chronic diastolic CHF (congestive heart failure) ([place])    a. 10/2015 Echo: EF 55-65%, Gr1 DD, mild MR, mildly dil LA, nl RV fxn, nl PASP;  b. 03/2016 Echo: EF 30-35%, mod MR, mod dil LA; c. 12/2016 Echo: EF 60-65%, no rwma, mild AS, mildly dil LA, PASP 52mmHg.  . CKD (chronic kidney disease), stage IV ([place])   . COPD (chronic obstructive pulmonary disease) ([place])   . Coronary artery disease    a. 11/2014 NSTEMI/PCI: LM nl, LAD 66m, D1 30, LCX mild dzs, OM1 20p, OM2 13m, OM3 90p (2.25x8 Promus Premier DES), RCA nl.   . Cough    CHRONIC AT NIGHT  . Cushing's disease ([place])   . Depression   . Diverticulitis   . Edema    FEET/LEGS  . GERD (gastroesophageal reflux disease)   . Gout   . History of hiatal hernia   . History of pneumonia   . HLD (hyperlipidemia)   . HOH (hard of hearing)   . Hypertension   . Hypertensive heart disease   . Lung cancer ([place]) dx'd 2014   S/P radiation 2015  . Migraine   . Mixed Ischemic and Nonischemic  Cardiomyopathy ([place])    a. 03/2016 Echo: EF 30-35%;  b. 12/2016 Echo: EF 60-65%.  . Moderate mitral regurgitation   . Multiple allergies   . Myocardial infarction ([place])   . Oxygen deficiency    2LITERS  . Persistent atrial fibrillation    a. CHADS2VASc ==> 7 (CHF, HTN, age x 2, DM, vascular disease, and gender)--was on renal dosed eliquis but this was d/c'd in 12/2016 in setting of dark stools/anemia;  c. 03/2016 s/p AVN and MDT BiV ICD placement.  . Presence of permanent cardiac pacemaker   . S/P AV nodal ablation    a. on 03/31/16 for persistant afib with CRT-P placement  . Sleep apnea   . Type II diabetes mellitus ([place])     Assessment: Pharmacy was consulted for heparin infusion dosing and monitoring in 85 yo female admitted for ACS/STEMI. No reported anticoagulants PTA. Baseline Hgb 10.0, PLT 167, INR elevated at 1.7 (noted h/o hepatic failure), aPTT 46s.   Goal of Therapy:  Heparin level 0.3-0.7 units/ml Monitor platelets by anticoagulation protocol: Yes   Heparin Course: 4/28 AM initiation: 4000 unit bolus, then 900 units/hr 4/28 0721 HL < 0.10  Plan:  --Heparin level undetectable: I spoke with Berenice who tells me there has been no interruption in therapy and no IV leak --Give 2000 units bolus x 1 --Increase heparin infusion rate to 1200 units/hr --Check anti-Xa level in 8 hours and daily while on heparin --Continue to monitor H&H and platelets  Dallie Piles, PharmD Clinical Pharmacist 12/24/2018 7:29 AM

## 2018-12-25 ENCOUNTER — Inpatient Hospital Stay: Payer: Medicare Other

## 2018-12-25 ENCOUNTER — Other Ambulatory Visit: Payer: Medicare Other

## 2018-12-25 DIAGNOSIS — N189 Chronic kidney disease, unspecified: Secondary | ICD-10-CM

## 2018-12-25 LAB — HEPATIC FUNCTION PANEL
ALT: 571 U/L — ABNORMAL HIGH (ref 0–44)
AST: 963 U/L — ABNORMAL HIGH (ref 15–41)
Albumin: 3 g/dL — ABNORMAL LOW (ref 3.5–5.0)
Alkaline Phosphatase: 69 U/L (ref 38–126)
Bilirubin, Direct: 1.3 mg/dL — ABNORMAL HIGH (ref 0.0–0.2)
Indirect Bilirubin: 1.5 mg/dL — ABNORMAL HIGH (ref 0.3–0.9)
Total Bilirubin: 2.8 mg/dL — ABNORMAL HIGH (ref 0.3–1.2)
Total Protein: 6.4 g/dL — ABNORMAL LOW (ref 6.5–8.1)

## 2018-12-25 LAB — CBC
HCT: 33.4 % — ABNORMAL LOW (ref 36.0–46.0)
Hemoglobin: 10 g/dL — ABNORMAL LOW (ref 12.0–15.0)
MCH: 24.8 pg — ABNORMAL LOW (ref 26.0–34.0)
MCHC: 29.9 g/dL — ABNORMAL LOW (ref 30.0–36.0)
MCV: 82.7 fL (ref 80.0–100.0)
Platelets: 139 10*3/uL — ABNORMAL LOW (ref 150–400)
RBC: 4.04 MIL/uL (ref 3.87–5.11)
RDW: 18.8 % — ABNORMAL HIGH (ref 11.5–15.5)
WBC: 5.8 10*3/uL (ref 4.0–10.5)
nRBC: 0 % (ref 0.0–0.2)

## 2018-12-25 LAB — HEPARIN LEVEL (UNFRACTIONATED)
Heparin Unfractionated: <0.1 IU/mL — ABNORMAL LOW (ref 0.30–0.70)
Heparin Unfractionated: <0.1 IU/mL — ABNORMAL LOW (ref 0.30–0.70)

## 2018-12-25 LAB — BASIC METABOLIC PANEL
Anion gap: 18 — ABNORMAL HIGH (ref 5–15)
BUN: 87 mg/dL — ABNORMAL HIGH (ref 8–23)
CO2: 34 mmol/L — ABNORMAL HIGH (ref 22–32)
Calcium: 8.1 mg/dL — ABNORMAL LOW (ref 8.9–10.3)
Chloride: 89 mmol/L — ABNORMAL LOW (ref 98–111)
Creatinine, Ser: 2.77 mg/dL — ABNORMAL HIGH (ref 0.44–1.00)
GFR calc Af Amer: 18 mL/min — ABNORMAL LOW (ref >60)
GFR calc non Af Amer: 15 mL/min — ABNORMAL LOW (ref >60)
Glucose, Bld: 113 mg/dL — ABNORMAL HIGH (ref 70–99)
Potassium: 3.8 mmol/L (ref 3.5–5.1)
Sodium: 141 mmol/L (ref 135–145)

## 2018-12-25 LAB — URINE CULTURE

## 2018-12-25 MED ORDER — FUROSEMIDE 10 MG/ML IJ SOLN
60.0000 mg | Freq: Two times a day (BID) | INTRAMUSCULAR | Status: DC
  Administered 2018-12-25 – 2018-12-28 (×7): 60 mg via INTRAVENOUS
  Filled 2018-12-25 (×7): qty 6

## 2018-12-25 MED ORDER — HEPARIN BOLUS VIA INFUSION
2300.0000 [IU] | Freq: Once | INTRAVENOUS | Status: AC
  Administered 2018-12-25: 2300 [IU] via INTRAVENOUS
  Filled 2018-12-25: qty 2300

## 2018-12-25 MED ORDER — TRAMADOL HCL 50 MG PO TABS
50.0000 mg | ORAL_TABLET | Freq: Two times a day (BID) | ORAL | Status: DC | PRN
  Administered 2018-12-25 – 2018-12-27 (×4): 50 mg via ORAL
  Filled 2018-12-25 (×4): qty 1

## 2018-12-25 MED ORDER — TECHNETIUM TO 99M ALBUMIN AGGREGATED
4.0000 | Freq: Once | INTRAVENOUS | Status: AC | PRN
  Administered 2018-12-25: 4.31 via INTRAVENOUS

## 2018-12-25 MED ORDER — LEVALBUTEROL HCL 0.63 MG/3ML IN NEBU
0.6300 mg | INHALATION_SOLUTION | Freq: Four times a day (QID) | RESPIRATORY_TRACT | Status: DC | PRN
  Administered 2018-12-27: 0.63 mg via RESPIRATORY_TRACT
  Filled 2018-12-25: qty 3

## 2018-12-25 MED ORDER — HEPARIN BOLUS VIA INFUSION
2300.0000 [IU] | Freq: Once | INTRAVENOUS | Status: AC
  Administered 2018-12-25: 09:00:00 2300 [IU] via INTRAVENOUS
  Filled 2018-12-25: qty 2300

## 2018-12-25 NOTE — Progress Notes (Signed)
ANTICOAGULATION CONSULT NOTE - Initial Consult  Pharmacy Consult for Heparin  Indication: chest pain/ACS  Allergies  Allergen Reactions  . Ciprofloxacin Shortness Of Breath, Itching and Rash  . Doxycycline Shortness Of Breath, Itching and Rash  . Penicillins Shortness Of Breath, Itching, Rash and Other (See Comments)    Has patient had a PCN reaction causing immediate rash, facial/tongue/throat swelling, SOB or lightheadedness with hypotension: Yes Has patient had a PCN reaction causing severe rash involving mucus membranes or skin necrosis: No Has patient had a PCN reaction that required hospitalization No Has patient had a PCN reaction occurring within the last 10 years: No If all of the above answers are "NO", then may proceed with Cephalosporin use.  . Sulfa Antibiotics Shortness Of Breath, Itching and Rash  . Latex Itching  . Morphine And Related Itching  . Albuterol Sulfate   . Macrobid [Nitrofurantoin Macrocrystal] Itching  . Cefuroxime Rash    Blisters in mouth    Patient Measurements: Height: 5\' 6"  (167.6 cm) Weight: 178 lb 12.7 oz (81.1 kg) IBW/kg (Calculated) : 59.3 Heparin Dosing Weight:  75.9 kg   Vital Signs: Temp: 98.4 F (36.9 C) (04/29 1928) Temp Source: Oral (04/29 1928) BP: 143/74 (04/29 1928) Pulse Rate: 70 (04/29 1928)  Labs: Recent Labs    12/01/2018 2045 12/24/18 0229  12/24/18 0721 12/24/18 1954 12/25/18 0558 12/25/18 1707  HGB 10.0*  --   --   --   --  10.0*  --   HCT 34.0*  --   --   --   --  33.4*  --   PLT 167  --   --   --   --  139*  --   APTT 44*  --   --   --   --   --   --   LABPROT 19.6*  --   --   --   --   --   --   INR 1.7*  --   --   --   --   --   --   HEPARINUNFRC  --   --    < > <0.10* <0.10* <0.10* <0.10*  CREATININE 2.99*  --   --   --   --  2.77*  --   CKTOTAL 470*  --   --   --   --   --   --   TROPONINI 5.35* 5.56*  --  5.02*  --   --   --    < > = values in this interval not displayed.    Estimated Creatinine  Clearance: 16.5 mL/min (A) (by C-G formula based on SCr of 2.77 mg/dL (H)).   Medical History: Past Medical History:  Diagnosis Date  . Anemia of chronic disease   . Arthritis   . Asthma   . Cardiac resynchronization therapy pacemaker (CRT-P) in place    a. 03/31/16:  Medtronic Percepta Quad CRT-P MRI SureScan (serial Number [id number]) device.  . Chronic diastolic CHF (congestive heart failure) ([place])    a. 10/2015 Echo: EF 55-65%, Gr1 DD, mild MR, mildly dil LA, nl RV fxn, nl PASP;  b. 03/2016 Echo: EF 30-35%, mod MR, mod dil LA; c. 12/2016 Echo: EF 60-65%, no rwma, mild AS, mildly dil LA, PASP 13mmHg.  . CKD (chronic kidney disease), stage IV ([place])   . COPD (chronic obstructive pulmonary disease) ([place])   . Coronary artery disease    a. 11/2014 NSTEMI/PCI: LM nl, LAD 90m, D1  30, LCX mild dzs, OM1 20p, OM2 56m, OM3 90p (2.25x8 Promus Premier DES), RCA nl.   . Cough    CHRONIC AT NIGHT  . Cushing's disease ([place])   . Depression   . Diverticulitis   . Edema    FEET/LEGS  . GERD (gastroesophageal reflux disease)   . Gout   . History of hiatal hernia   . History of pneumonia   . HLD (hyperlipidemia)   . HOH (hard of hearing)   . Hypertension   . Hypertensive heart disease   . Lung cancer ([place]) dx'd 2014   S/P radiation 2015  . Migraine   . Mixed Ischemic and Nonischemic Cardiomyopathy ([place])    a. 03/2016 Echo: EF 30-35%;  b. 12/2016 Echo: EF 60-65%.  . Moderate mitral regurgitation   . Multiple allergies   . Myocardial infarction ([place])   . Oxygen deficiency    2LITERS  . Persistent atrial fibrillation    a. CHADS2VASc ==> 7 (CHF, HTN, age x 2, DM, vascular disease, and gender)--was on renal dosed eliquis but this was d/c'd in 12/2016 in setting of dark stools/anemia;  c. 03/2016 s/p AVN and MDT BiV ICD placement.  . Presence of permanent cardiac pacemaker   . S/P AV nodal ablation    a. on 03/31/16 for persistant afib with CRT-P placement  . Sleep apnea   . Type II diabetes mellitus  ([facility])     Medications:  Medications Prior to Admission  Medication Sig Dispense Refill Last Dose  . ALPRAZolam (XANAX) 0.5 MG tablet Take 0.5 mg by mouth at bedtime as needed for sleep.    12/22/2018 at prn  . atorvastatin (LIPITOR) 20 MG tablet Take 20 mg by mouth daily.   12/22/2018 at Unknown  . busPIRone (BUSPAR) 7.5 MG tablet Take 7.5 mg by mouth 2 (two) times daily.   12/22/2018 at Unknown  . fenofibrate (TRICOR) 48 MG tablet Take 48 mg by mouth daily.    12/22/2018 at Unknown  . levalbuterol (XOPENEX) 1.25 MG/0.5ML nebulizer solution Take 1.25 mg by nebulization every 6 (six) hours as needed for wheezing or shortness of breath. 1 each 12 prn at prn  . metoprolol succinate (TOPROL XL) 25 MG 24 hr tablet Take 25 mg by mouth daily.    12/22/2018 at Unknown  . ondansetron (ZOFRAN-ODT) 4 MG disintegrating tablet Take 1 tablet by mouth every 8 (eight) hours as needed.    prn at prn  . sertraline (ZOLOFT) 50 MG tablet Take 1 tablet (50 mg total) by mouth daily. 30 tablet 2 12/24/2018 at Unknown  . spironolactone (ALDACTONE) 25 MG tablet Take 25 mg by mouth daily.  3 12/22/2018 at Unknown  . torsemide (DEMADEX) 20 MG tablet Take 40 mg by mouth daily.  3 12/22/2018 at Unknown  . traMADol (ULTRAM) 50 MG tablet Take 50 mg by mouth 2 (two) times daily.   3 12/22/2018 at Unknown time  . Aclidinium Bromide 400 MCG/ACT AEPB Inhale into the lungs.   Taking  . feeding supplement, ENSURE ENLIVE, (ENSURE ENLIVE) LIQD Take 237 mLs by mouth 3 (three) times daily with meals. 237 mL 12 Taking  . FERREX 150 150 MG capsule Take 150 mg by mouth daily.   Not Taking at Unknown time  . fluticasone furoate-vilanterol (BREO ELLIPTA) 100-25 MCG/INH AEPB Inhale 1 puff into the lungs daily.    Not Taking at Unknown  . ipratropium (ATROVENT) 0.06 % nasal spray USE 2 SPRAY(S) IN EACH NOSTRIL TWICE DAILY  Not Taking at Unknown time  . nitroGLYCERIN (NITROSTAT) 0.4 MG SL tablet Place 1 tablet (0.4 mg total) under the tongue every 5  (five) minutes as needed for chest pain. (Patient not taking: Reported on 12/24/2018) 25 tablet 3 Not Taking at prn  . tiotropium (SPIRIVA) 18 MCG inhalation capsule Place 18 mcg into inhaler and inhale daily as needed (shortness of breath).    Not Taking at Unknown  . vitamin B-12 (CYANOCOBALAMIN) 1000 MCG tablet Take 1 tablet by mouth daily.   Not Taking at Unknown time    Assessment: Pharmacy was consulted for heparin infusion dosing and monitoring in 85 yo female admitted for ACS/STEMI. No reported anticoagulants PTA. Baseline Hgb 10.0, PLT 167, INR elevated at 1.7 (noted h/o hepatic failure), aPTT 46s.   Goal of Therapy:  Heparin level 0.3-0.7 units/ml Monitor platelets by anticoagulation protocol: Yes   Heparin Course: 4/28 AM initiation: 4000 unit bolus, then 900 units/hr 4/28 0721 HL < 0.10: 2000 unit bolus, then inc to 1200 units/hr 4/28 1954 HL<0.10: 2300 unit bolus, then inc to 1500 units/hr  Plan:  --Heparin level again undetectable --Give 2300 units bolus x 1 --Increase heparin infusion rate to 1800 units/hr --plan by cardiology is to continue heparin until PE is ruled out. A VQ scan is scheduled to assess --Check anti-Xa level in 8 hours and daily while on heparin --Continue to monitor H&H and platelets  4/29 :  HL @ 1707 = < 0.1 Will order Heparin 2300 units IV X 1 bolus and increase drip rate to 2100 units/hr. Will recheck HL 8 hrs after rate change.  Marcena Dias D 12/25/2018,7:53 PM

## 2018-12-25 NOTE — Progress Notes (Signed)
Progress Note  Patient Name: Regina Burke Date of Encounter: 12/25/2018  Primary Cardiologist: Rockey Situ Primary Electrophysiologist: Caryl Comes  Subjective   Feels about the same with continued nonproductive cough and SOB with associated orthopnea. Sore in the chest from coughing. Documented UOP of 1.6 L for the past 24 hours. SCr improved from 2.99-->2.77 with diuresis. Potassium stable. HGB low though stable at 10.0. Remains on heparin gtt. Continues to note RUQ abdominal pain.   Inpatient Medications    Scheduled Meds:  aspirin EC  81 mg Oral Daily   busPIRone  7.5 mg Oral BID   feeding supplement (ENSURE ENLIVE)  237 mL Oral TID WC   fluticasone furoate-vilanterol  1 puff Inhalation Daily   furosemide  60 mg Intravenous BID   heparin  2,300 Units Intravenous Once   metoprolol succinate  25 mg Oral Daily   sertraline  50 mg Oral Daily   sodium chloride flush  3 mL Intravenous Q12H   vitamin B-12  1,000 mcg Oral Daily   Continuous Infusions:  sodium chloride     heparin 1,500 Units/hr (12/24/18 2159)   PRN Meds: sodium chloride, acetaminophen, nitroGLYCERIN, ondansetron (ZOFRAN) IV, sodium chloride flush, tiotropium, traMADol   Vital Signs    Vitals:   12/24/18 0819 12/24/18 1712 12/24/18 2013 12/25/18 0519  BP: (!) 157/55 (!) 136/51 (!) 128/48 96/86  Pulse: 72 73 68 72  Resp: 18 18 18 18   Temp: 97.7 F (36.5 C) 98.3 F (36.8 C) 98.3 F (36.8 C) 98.4 F (36.9 C)  TempSrc:  Oral Oral Oral  SpO2: 94% 93% 95% 90%  Weight:    81.1 kg  Height:        Intake/Output Summary (Last 24 hours) at 12/25/2018 0816 Last data filed at 12/25/2018 4503 Gross per 24 hour  Intake 3.03 ml  Output 1650 ml  Net -1646.97 ml   Filed Weights   12/08/2018 1847 12/25/18 0519  Weight: 80 kg 81.1 kg    Telemetry    V paced - Personally Reviewed  ECG    n/a - Personally Reviewed  Physical Exam   GEN: No acute distress.   Neck: JVD elevated ~ 10 cm. Cardiac:  RRR, II/VI systolic murmur RUSB, no rubs, or gallops.  Respiratory: Diminished breath sounds along the bilateral bases.  GI: Soft, significantly tender along the RUQ with associated hyperpigmentation in this region without vesicular rash, non-distended.   MS: Trace bilateral pretibial edema; No deformity. Neuro:  Alert and oriented x 3; Nonfocal.  Psych: Normal affect.  Labs    Chemistry Recent Labs  Lab 12/18/2018 2045 12/25/18 0558  NA 140 141  K 3.8 3.8  CL 87* 89*  CO2 29 34*  GLUCOSE 129* 113*  BUN 62* 87*  CREATININE 2.99* 2.77*  CALCIUM 8.1* 8.1*  PROT 7.2  --   ALBUMIN 3.6  --   AST 1,975*  --   ALT 607*  --   ALKPHOS 81  --   BILITOT 3.0*  --   GFRNONAA 14* 15*  GFRAA 16* 18*  ANIONGAP 24* 18*     Hematology Recent Labs  Lab 12/05/2018 2045 12/25/18 0558  WBC 12.9* 5.8  RBC 3.99 4.04  HGB 10.0* 10.0*  HCT 34.0* 33.4*  MCV 85.2 82.7  MCH 25.1* 24.8*  MCHC 29.4* 29.9*  RDW 18.5* 18.8*  PLT 167 139*    Cardiac Enzymes Recent Labs  Lab 12/14/2018 2045 12/24/18 0229 12/24/18 0721  TROPONINI 5.35* 5.56* 5.02*  No results for input(s): TROPIPOC in the last 168 hours.   BNP Recent Labs  Lab 12/26/2018 2049  BNP 2,346.0*     DDimer No results for input(s): DDIMER in the last 168 hours.   Radiology    Dg Chest Portable 1 View  Result Date: 12/25/2018 IMPRESSION: 1. Increased bilateral pulmonary interstitial opacity. Top differential considerations include acute pulmonary interstitial edema and viral/atypical respiratory infection. No pleural effusion. 2. Stable cardiomegaly. Aortic Atherosclerosis (ICD10-I70.0). Electronically Signed   By: Genevie Ann M.D.   On: 12/25/2018 21:17   US Abdomen Limited Ruq  Result Date: 12/24/2018 IMPRESSION: Mildly heterogeneous liver parenchyma with suggestion of mild nodularity of the surface contour. Early cirrhosis cannot be excluded. Electronically Signed   By: Aletta Edouard M.D.   On: 12/24/2018 09:30    Cardiac  Studies   2D Echo 12/24/2018: 1. The left ventricle has normal systolic function with an ejection fraction of 60-65%. The cavity size was normal. There is mildly increased left ventricular wall thickness. Left ventricular diastolic function could not be evaluated secondary to atrial fibrillation. There is right ventricular volume and pressure overload.  2. The right ventricle has moderately reduced systolic function. The cavity was moderately enlarged. There is moderately increased right ventricular wall thickness. Right ventricular systolic pressure is moderately to severely elevated with an estimated pressure of 67 mmHg.  3. Left atrial size was severely dilated.  4. Right atrial size was moderately dilated.  5. Mildly thickened tricuspid valve leaflets.  6. The mitral valve is degenerative. Mild thickening of the mitral valve leaflet. There is moderate mitral annular calcification present. Mitral valve regurgitation is mild to moderate by color flow Doppler.  7. The tricuspid valve is abnormal. Tricuspid valve regurgitation is moderate-severe.  8. The aortic valve is tricuspid. Moderate thickening of the aortic valve. Mild calcification of the aortic valve. Aortic valve regurgitation is trivial by color flow Doppler. Mild stenosis of the aortic valve.  9. The inferior vena cava was dilated in size with <50% respiratory variability. 10. The interatrial septum appears to be lipomatous. 11. Pericardial thickening is suspected. __________  Device interrogation 12/24/2018: Device: Medtronic Percepta Quad CRT-P Mode: VVIR Lower rate: 70 bpm Upper sensor: 110 bpm VT monitor: 150 bpm  Impedence: RV 494, LV 494 Capture threshold: RV 0.625 V @ 0.40 ms Pulse amplitude/width: RV 2.5 V/0.40 ms, LV 2.5 V/1.5 ms Sensitivity: RV 4.00 mV  Events: None.  VP: 100%  OptiVol Alert: Fluid accumulation ongoing sinice 12/16/2018.  No programming changes made. __________  Patient Profile     85  y.o. female with history of coronary artery disease status post PCI to OM 3 in 2016 in setting of NSTEMI, chronic systolic and diastolic heart failure, permanent atrial fibrillation status post AV node ablation and biventricular pacemaker, chronic kidney disease stage IV, chronic respiratory failure with hypoxia and hypercapnia secondary to COPD, hypertension, hyperlipidemia, type 2 diabetes mellitus, and chronic anemia with prior GI bleed who is being seen today for the evaluation of shortness of breath and elevated troponin at the request of Dr. Manuella Ghazi.  Assessment & Plan    1. Acute on chronic HFpEF/right heart failure: -She continues to appear volume up -Continue IV Lasix 60 mg bid with close monitoring of SCr which is improving with diuresis  -Continue Toprol  -Given a predominant right heart component and evidence of liver injury, PE must be excluded. Consider VQ scan as below  2. CAD involving the native coronary arteries with elevated troponin: -  Patient's presentation is not consistent with acute coronary syndrome.  It has been felt her troponin elevation represents supply-demand mismatch in the setting of diastolic heart failure, chronic kidney disease, and other acute illness that has precipitated multiorgan dysfunction (e.g. PE or infectious process) -Continue heparin gtt for 48 hours or at least until PE has been excluded as below -No plans for cardiac cath at this time -Continue ASA and Toprol -Lipitor and fenofibrate have been held as below given acute liver injury   3. Permanent Afib: -100% ventricular paced on device interrogation this admission without significant arrhythmia noted -Continue heparin gtt for now -In the past, she has been felt to not be a good candidate for long term [place] due to medication noncompliance and prior GI bleed -Toprol XL  4. Acute liver failure and abdominal pain: -Patient has evidence of transaminitis as well as elevated bilirubin and INR.  Etiologies  include congestive hepatopathy from right heart failure, hepatic infarcts from cardioembolic phenomena in the setting of permanent atrial fibrillation not on anticoagulation, viral hepatitis, medication side effect, or transient hypotension (though patient's blood pressure has been normal to elevated during this admission).  Right upper quadrant ultrasound shows mildly heterogeneous liver parenchyma with suggestion of mild nodularity that could be seen with early cirrhosis -Lipitor and fenofibrate have been discontinued this admission -Avoid other potentially hepatotoxic agents -Close monitoring of liver function with consideration for GI consult. Deferred to IM -She reports a history of shingles along the RUQ recently and continues to be sore, to light touch in this region with noted hyperpigmentation of the RUQ on exam. There does not appear to be any active vesicular rash. Cannot exclude a component of postherpetic neuralgia playing a role in her abdominal pain   5. Acute on CKD stage III-IV: -Baseline SCr appears to be around 2-2.5 -Improved with diuresis -Possibly in the setting of congestion with right heart failure  -Avoid nephrotoxic agents with continued discontinuation of spironolactone long term    6. SOB/cough: -Symptoms could be due to a number of possibilities, including COPD exacerbation (though not wheezing) and acute on chronic HFpEF.  COVID-19 testing was negative. There remains concern for PE or infectious pneumonitis could be contributing based on radiographic findings -Recommend VQ when able to be performed from a respiratory perspective with continuation of heparin gtt until PE has been excluded -PE work up is deferred to IM  For questions or updates, please contact [place] Please consult [email or web address] for contact info under Cardiology/STEMI.    Signed, Christell Faith, PA-C [facility] Pager: [phone] 12/25/2018, 8:16 AM

## 2018-12-25 NOTE — Plan of Care (Signed)
  Problem: Activity: Goal: Will identify at least one activity in which they can participate Outcome: Progressing   Problem: Coping: Goal: Ability to identify and develop effective coping behavior will improve Outcome: Progressing Goal: Ability to interact with others will improve Outcome: Progressing Goal: Demonstration of participation in decision-making regarding own care will improve Outcome: Progressing Goal: Ability to use eye contact when communicating with others will improve Outcome: Progressing   Problem: Health Behavior/Discharge Planning: Goal: Identification of resources available to assist in meeting health care needs will improve Outcome: Progressing   Problem: Self-Concept: Goal: Will verbalize positive feelings about self Outcome: Progressing   Problem: Education: Goal: Knowledge of General Education information will improve Description Including pain rating scale, medication(s)/side effects and non-pharmacologic comfort measures Outcome: Progressing   Problem: Health Behavior/Discharge Planning: Goal: Ability to manage health-related needs will improve Outcome: Progressing   Problem: Clinical Measurements: Goal: Ability to maintain clinical measurements within normal limits will improve Outcome: Not Progressing Note:  GFR = < 20 today. Will continue to monitor renal function. Wenda Low [facility]

## 2018-12-25 NOTE — Consult Note (Signed)
ANTICOAGULATION CONSULT NOTE  Pharmacy Consult for Heparin Infusion  Indication: chest pain/ACS  Patient Measurements: Height: 5\' 6"  (167.6 cm) Weight: 178 lb 12.7 oz (81.1 kg) IBW/kg (Calculated) : 59.3 Heparin Dosing Weight: 75.9   Vital Signs: Temp: 98.4 F (36.9 C) (04/29 0519) Temp Source: Oral (04/29 0519) BP: 96/86 (04/29 0519) Pulse Rate: 72 (04/29 0519)  Labs: Recent Labs    12/22/2018 2045 12/24/18 0229 12/24/18 0721 12/24/18 1954 12/25/18 0558  HGB 10.0*  --   --   --  10.0*  HCT 34.0*  --   --   --  33.4*  PLT 167  --   --   --  139*  APTT 44*  --   --   --   --   LABPROT 19.6*  --   --   --   --   INR 1.7*  --   --   --   --   HEPARINUNFRC  --   --  <0.10* <0.10*  --   CREATININE 2.99*  --   --   --  2.77*  CKTOTAL 470*  --   --   --   --   TROPONINI 5.35* 5.56* 5.02*  --   --     Estimated Creatinine Clearance: 16.5 mL/min (A) (by C-G formula based on SCr of 2.77 mg/dL (H)).   Medical History: Past Medical History:  Diagnosis Date  . Anemia of chronic disease   . Arthritis   . Asthma   . Cardiac resynchronization therapy pacemaker (CRT-P) in place    a. 03/31/16:  Medtronic Percepta Quad CRT-P MRI SureScan (serial Number [id number]) device.  . Chronic diastolic CHF (congestive heart failure) ([place])    a. 10/2015 Echo: EF 55-65%, Gr1 DD, mild MR, mildly dil LA, nl RV fxn, nl PASP;  b. 03/2016 Echo: EF 30-35%, mod MR, mod dil LA; c. 12/2016 Echo: EF 60-65%, no rwma, mild AS, mildly dil LA, PASP 75mmHg.  . CKD (chronic kidney disease), stage IV ([place])   . COPD (chronic obstructive pulmonary disease) ([place])   . Coronary artery disease    a. 11/2014 NSTEMI/PCI: LM nl, LAD 49m, D1 30, LCX mild dzs, OM1 20p, OM2 28m, OM3 90p (2.25x8 Promus Premier DES), RCA nl.   . Cough    CHRONIC AT NIGHT  . Cushing's disease ([place])   . Depression   . Diverticulitis   . Edema    FEET/LEGS  . GERD (gastroesophageal reflux disease)   . Gout   . History of hiatal hernia    . History of pneumonia   . HLD (hyperlipidemia)   . HOH (hard of hearing)   . Hypertension   . Hypertensive heart disease   . Lung cancer ([place]) dx'd 2014   S/P radiation 2015  . Migraine   . 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 03/2016 Echo: EF 30-35%;  b. 12/2016 Echo: EF 60-65%.  . Moderate mitral regurgitation   . Multiple allergies   . Myocardial infarction ([place])   . Oxygen deficiency    2LITERS  . Persistent atrial fibrillation    a. CHADS2VASc ==> 7 (CHF, HTN, age x 2, DM, vascular disease, and gender)--was on renal dosed eliquis but this was d/c'd in 12/2016 in setting of dark stools/anemia;  c. 03/2016 s/p AVN and MDT BiV ICD placement.  . Presence of permanent cardiac pacemaker   . S/P AV nodal ablation    a. on 03/31/16 for persistant afib  with CRT-P placement  . Sleep apnea   . Type II diabetes mellitus ([place])     Assessment: Pharmacy was consulted for heparin infusion dosing and monitoring in 85 yo female admitted for ACS/STEMI. No reported anticoagulants PTA. Baseline Hgb 10.0, PLT 167, INR elevated at 1.7 (noted h/o hepatic failure), aPTT 46s.   Goal of Therapy:  Heparin level 0.3-0.7 units/ml Monitor platelets by anticoagulation protocol: Yes   Heparin Course: 4/28 AM initiation: 4000 unit bolus, then 900 units/hr 4/28 0721 HL < 0.10: 2000 unit bolus, then inc to 1200 units/hr 4/28 1954 HL<0.10: 2300 unit bolus, then inc to 1500 units/hr  Plan:  --Heparin level again undetectable --Give 2300 units bolus x 1 --Increase heparin infusion rate to 1800 units/hr --plan by cardiology is to continue heparin until PE is ruled out. A VQ scan is scheduled to assess --Check anti-Xa level in 8 hours and daily while on heparin --Continue to monitor H&H and platelets  Dallie Piles, PharmD Clinical Pharmacist 12/25/2018 7:23 AM

## 2018-12-25 NOTE — TOC Initial Note (Signed)
Transition of Care [facility]) - Initial/Assessment Note    Patient Details  Name: Regina Burke MRN: [id number] Date of Birth: 23-Oct-1932  Transition of [facility]) CM/SW Contact:    Elza Rafter, RN Phone Number: 12/25/2018, 2:36 PM  Clinical Narrative:      Patient is from home with son.  Current with Dr. Ginette Pitman; obtains medications without difficulty at [facility] on [address].  Has a functioning scale at home and weighs daily.  Not current with Darylene Price at the heart failure clinic.  Uses 2L chronic oxygen at home.  Has a walker and a wheelchair.  Son transports her to appointments.  Referral to Helene Kelp with [facility] for home health RN, PT, aide and ReDS Vest.           Expected Discharge Plan: [place] Barriers to Discharge: Continued Medical Work up   Patient Goals and CMS Choice Patient states their goals for this hospitalization and ongoing recovery are:: discharge home not rehab  CMS Medicare.gov Compare Post Acute Care list provided to:: Patient Choice offered to / list presented to : Patient  Expected Discharge Plan and Services Expected Discharge Plan: [place] Choice: [place] arrangements for the past 2 months: Single Family Home Expected Discharge Date: 12/26/18                         HH Arranged: RN, PT, Nurse's Aide(ReDS Vest) [place] Agency: [facility] (now [facility]) Date [place]: 12/25/18 Time [place]: 35 Representative spoke with at [place]: [place] Arrangements/Services Living arrangements for the past 2 months: [place] Lives with:: Adult Children Patient language and need for interpreter reviewed:: Yes Do you feel safe going back to the place where you live?: Yes            Criminal Activity/Legal Involvement Pertinent to Current Situation/Hospitalization: No - Comment as needed  Activities of Daily  Living Home Assistive Devices/Equipment: Bedside commode/3-in-1 ADL Screening (condition at time of admission) Patient's cognitive ability adequate to safely complete daily activities?: Yes Is the patient deaf or have difficulty hearing?: No Does the patient have difficulty seeing, even when wearing glasses/contacts?: No Does the patient have difficulty concentrating, remembering, or making decisions?: No Patient able to express need for assistance with ADLs?: Yes Does the patient have difficulty dressing or bathing?: Yes Independently performs ADLs?: No Communication: Independent Dressing (OT): Needs assistance Is this a change from baseline?: Change from baseline, expected to last >3 days Grooming: Needs assistance Is this a change from baseline?: Change from baseline, expected to last <3 days Feeding: Independent Bathing: Needs assistance Is this a change from baseline?: Change from baseline, expected to last <3 days Toileting: Needs assistance Is this a change from baseline?: Change from baseline, expected to last <3 days In/Out Bed: Needs assistance Is this a change from baseline?: Change from baseline, expected to last <3 days Walks in Home: Independent with device (comment)(uses waler and wheelchair) Does the patient have difficulty walking or climbing stairs?: Yes Weakness of Legs: Both Weakness of Arms/Hands: None  Permission Sought/Granted Permission sought to share information with : Facility Art therapist granted to share information with : Yes, Verbal Permission Granted     Permission granted to share info w AGENCY: [facility]        Emotional Assessment Appearance:: Appears stated  age Attitude/Demeanor/Rapport: Gracious Affect (typically observed): Accepting Orientation: : Oriented to Self, Oriented to Place, Oriented to  Time, Oriented to Situation Alcohol / Substance Use: Not Applicable    Admission diagnosis:  NSTEMI (non-ST elevated  myocardial infarction) ([place]) [I21.4] AKI (acute kidney injury) ([place]) [N17.9] Acute liver failure without hepatic coma [K72.00] Stage 3 chronic kidney disease (HCC) [N18.3] Acute congestive heart failure, unspecified heart failure type [facility]) [I50.9] Patient Active Problem List   Diagnosis Date Noted  . NSTEMI (non-ST elevated myocardial infarction) ([place]) 12/16/2018  . Encephalopathy acute 05/30/2018  . Acute on chronic heart failure ([place])   . Goals of care, counseling/discussion   . Advance care planning   . S/p left hip fracture 01/31/2017  . Pre-operative cardiovascular examination 01/31/2017  . Other diseases of stomach and duodenum   . Dizziness 08/25/2016  . LBBB (left bundle branch block) 04/01/2016  . S/P AV nodal ablation   . Cardiac resynchronization therapy pacemaker (CRT-P) in place   . Persistent atrial fibrillation ([place]): CHA2DS2-Vasc = ~7. On Xarelto 15 mg (age & renal Fxn) 03/18/2016  . Congestive dilated cardiomyopathy ([place]) 03/18/2016    Class: Temporary  . DNR (do not resuscitate) discussion   . Palliative care encounter   . Persistent atrial fibrillation   . Chronic diastolic CHF (congestive heart failure) ([place]) 02/16/2016  . HLD (hyperlipidemia) 02/16/2016  . Iron deficiency anemia due to chronic blood loss   . Essential hypertension   . Diaphoresis   . Renal insufficiency   . Chronic obstructive pulmonary disease ([place]) 07/14/2015  . Status post thoracentesis   . S/P thoracentesis   . H/O: lung cancer   . Pleural effusion   . Coronary artery disease involving native coronary artery of native heart without angina pectoris   . S/P coronary artery stent placement   . Pressure ulcer 06/08/2015  . Dehydration 05/09/2015  . Diabetes ([place]) 05/09/2015  . Closed fracture nasal bone 05/09/2015  . Cancer of upper lobe of right lung ([place]) 01/08/2015  . Allergic state 01/08/2015  . Anemia associated with chronic renal failure 01/08/2015  . Chronic kidney disease  01/08/2015  . CAFL (chronic airflow limitation) ([place]) 01/08/2015  . Arthritis, degenerative 01/08/2015  . 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 Diabetes mellitus, type 2 ([place]) 09/11/2014  . Type 2 diabetes mellitus ([place]) 09/11/2014   PCP:  Tracie Harrier, MD Pharmacy:   [facility] - [address], [place] [address] Phone: [phone] Fax: [place] [address] ([address]), [place] - [place]) [place] [address] Phone: [phone] Fax: [phone]     Social Determinants of Health (SDOH) Interventions    Readmission Risk Interventions Readmission Risk Prevention Plan 12/25/2018  Transportation Screening Complete  Medication Review ([place]) Complete  PCP or Specialist appointment within 3-5 days of discharge Complete  HRI or [place] Complete  SW Recovery Care/Counseling Consult Complete  [place] Not Applicable  Some recent data might be hidden

## 2018-12-25 NOTE — Progress Notes (Signed)
PT Cancellation Note  Patient Details Name: Regina Burke MRN: [id number] DOB: 05-01-1933   Cancelled Treatment:    Reason Eval/Treat Not Completed: Other (comment)(Per chart review, MD concerned for potential PE. Pt currently on heparin drip. Per PT guidelines prior to initiating PT pt needs anti-coagulation for 48hrs in context of PE. PT will follow as able.)  Lieutenant Diego PT, DPT 4:27 PM,12/25/18 [phone]

## 2018-12-25 NOTE — Progress Notes (Signed)
Cardiovascular and Pulmonary Nurse Navigator Note:    85 year old female with hx of CAD s/p PCI in the past, chronic combined systolic and diastolic heart failure, PAF status post AV node ablation and CKD Stage IV who presented to the ED due to SOB.  Patient admitted with acute on chronic combined systolic and diastolic heart failure and elevated troponin.  BNP on admission 2346. Trop peaked at 5.56.  Evaluated by Cardiology who determined "patient's presentation is not consistent with acute coronary syndrome and her troponin elevation represents supply-demand mismatch in the setting of diastolic heart failure, chronic kidney disease, and other acute illness that has precipitated multiorgan dysfunction (e.g. PEor infectious process)."  Echo performed on 12/24/2018 -  ___________________________________  Patient Name:   TALMA AGUILLARD Date of Exam: 12/24/2018 Medical Rec #:  [id number]       Height:       66.0 in Accession #:    [id number]      Weight:       176.4 lb Date of Birth:  [birth year]      BSA:          1.90 m Patient Age:    58 years        BP:           132/54 mmHg Patient Gender: F               HR:           70 bpm. Exam Location:  [facility]    Procedure: 2D Echo, Color Doppler and Cardiac Doppler  Indications:     CHF- acute diastolic 829.56   History:         Patient has prior history of Echocardiogram examinations, most                  recent 01/16/2018. COPD Risk Factors: Diabetes and Sleep Apnea.                  HLD, mixed ischemic and non- ischemic crdiomyopathy, moderate                  mitral regurgitation.   Sonographer:     Sherrie Sport RDCS (AE) Referring Phys:  Theo Dills SEALS Diagnosing Phys: Nelva Bush MD  IMPRESSIONS    1. The left ventricle has normal systolic function with an ejection fraction of 60-65%. The cavity size was normal. There is mildly increased left ventricular wall thickness. Left ventricular diastolic function could not be evaluated  secondary to atrial  fibrillation. There is right ventricular volume and pressure overload.  2. The right ventricle has moderately reduced systolic function. The cavity was moderately enlarged. There is moderately increased right ventricular wall thickness. Right ventricular systolic pressure is moderately to severely elevated with an estimated  pressure of 67 mmHg.  3. Left atrial size was severely dilated.  4. Right atrial size was moderately dilated.  5. Mildly thickened tricuspid valve leaflets.  6. The mitral valve is degenerative. Mild thickening of the mitral valve leaflet. There is moderate mitral annular calcification present. Mitral valve regurgitation is mild to moderate by color flow Doppler.  7. The tricuspid valve is abnormal. Tricuspid valve regurgitation is moderate-severe.  8. The aortic valve is tricuspid. Moderate thickening of the aortic valve. Mild calcification of the aortic valve. Aortic valve regurgitation is trivial by color flow Doppler. Mild stenosis of the aortic valve.  9. The inferior vena cava was dilated in size with <50% respiratory  variability. 10. The interatrial septum appears to be lipomatous. 11. Pericardial thickening is suspected. ___________________________________  Patient is undergoing a pulmonary perfusion study today to R/O  PE.     CHF Education - incomplete... Patient sitting up in bed with television on. Supplemental oxygen in place via [place].   Patient informed this RN that she did not eat lunch due to SOB and nausea. Patient informed this RN she lives with her son who does not cook.  So most of their meals are take out.  Upon asking patient if she had been watching her sodium intake in her diet, patient replied, "As best I can."   "She has a functioning scale at home and weighs herself every morning."  This RN asked patient if she restricts her fluid intake.  Patient replies, "I drink a lot of fluid."  This RN explained to patient if she is drinking  more fluid than she is voiding every day then as the days go by she will be fluid overloaded  Upon asking patient if she has the packet "Living Better with Heart Failure at Home," patient replied, "I cannot see, as I did not bring my glasses."   Transporter here to take patient to VQ Scan.    Will round on patient in the morning to provide CHF Education.    Roanna Epley, RN, BSN, [facility]  Cardiovascular & Pulmonary Nurse Navigator  Direct Line: [phone]  Department Phone #: [phone] Fax: [phone]  Email Address: [email or web address]

## 2018-12-25 NOTE — Progress Notes (Signed)
[facility] [place] NAME: Regina Burke    MR#:  [id number]  DATE OF BIRTH:  May 04, 1933  SUBJECTIVE:   patient here with SOB  REVIEW OF SYSTEMS:    Review of Systems  Constitutional: Negative for fever, chills weight loss HENT: Negative for ear pain, nosebleeds, congestion, facial swelling, rhinorrhea, neck pain, neck stiffness and ear discharge.   Respiratory: Negative for cough, ++ shortness of breath, no wheezing  Cardiovascular: Negative for chest pain, palpitations and ++ leg swelling.  Gastrointestinal: Negative for heartburn, abdominal pain, vomiting, diarrhea or consitpation Genitourinary: Negative for dysuria, urgency, frequency, hematuria Musculoskeletal: Negative for back pain or joint pain Neurological: Negative for dizziness, seizures, syncope, focal weakness,  numbness and headaches.  Hematological: Does not bruise/bleed easily.  Psychiatric/Behavioral: Negative for hallucinations, confusion, dysphoric mood    Tolerating Diet:yes      DRUG ALLERGIES:   Allergies  Allergen Reactions  . Ciprofloxacin Shortness Of Breath, Itching and Rash  . Doxycycline Shortness Of Breath, Itching and Rash  . Penicillins Shortness Of Breath, Itching, Rash and Other (See Comments)    Has patient had a PCN reaction causing immediate rash, facial/tongue/throat swelling, SOB or lightheadedness with hypotension: Yes Has patient had a PCN reaction causing severe rash involving mucus membranes or skin necrosis: No Has patient had a PCN reaction that required hospitalization No Has patient had a PCN reaction occurring within the last 10 years: No If all of the above answers are "NO", then may proceed with Cephalosporin use.  . Sulfa Antibiotics Shortness Of Breath, Itching and Rash  . Latex Itching  . Morphine And Related Itching  . Albuterol Sulfate   . Macrobid [Nitrofurantoin Macrocrystal] Itching  . Cefuroxime Rash    Blisters in  mouth    VITALS:  Blood pressure (!) 144/53, pulse 70, temperature 97.6 F (36.4 C), temperature source Oral, resp. rate 18, height 5\' 6"  (1.676 m), weight 81.1 kg, SpO2 (!) 87 %.  PHYSICAL EXAMINATION:  Constitutional: Appears well-developed and well-nourished. No distress. HENT: Normocephalic. Marland Kitchen Oropharynx is clear and moist.  Eyes: Conjunctivae and EOM are normal. PERRLA, no scleral icterus.  Neck: Normal ROM. Neck supple. ++ JVD. No tracheal deviation. CVS: irr, irr, S1/S2 +, 2/6 murmurs, no gallops, no carotid bruit.  Pulmonary: b/l crackles at bases Abdominal: Soft. BS +,  no distension, tenderness, rebound or guarding.  Musculoskeletal: Normal range of motion. No edema and no tenderness.  Neuro: Alert. CN 2-12 grossly intact. No focal deficits. Skin: Skin is warm and dry. No rash noted. Psychiatric: Normal mood and affect.      LABORATORY PANEL:   CBC Recent Labs  Lab 12/25/18 0558  WBC 5.8  HGB 10.0*  HCT 33.4*  PLT 139*   ------------------------------------------------------------------------------------------------------------------  Chemistries  Recent Labs  Lab 12/07/2018 2045 12/25/18 0558  NA 140 141  K 3.8 3.8  CL 87* 89*  CO2 29 34*  GLUCOSE 129* 113*  BUN 62* 87*  CREATININE 2.99* 2.77*  CALCIUM 8.1* 8.1*  MG 2.2  --   AST 1,975* 963*  ALT 607* 571*  ALKPHOS 81 69  BILITOT 3.0* 2.8*   ------------------------------------------------------------------------------------------------------------------  Cardiac Enzymes Recent Labs  Lab 12/05/2018 2045 12/24/18 0229 12/24/18 0721  TROPONINI 5.35* 5.56* 5.02*   ------------------------------------------------------------------------------------------------------------------  RADIOLOGY:  Dg Chest Portable 1 View  Result Date: 12/07/2018 CLINICAL DATA:  85 year old female shortness of breath and altered mental status. Abnormal pulmonary auscultation. EXAM: PORTABLE CHEST 1 VIEW COMPARISON:  10/28/2018 and earlier. FINDINGS: Portable AP semi upright view at 2049 hours. Stable lung volumes. Stable cardiomegaly and mediastinal contours. Stable left chest cardiac pacemaker. Calcified aortic atherosclerosis. Visualized tracheal air column is within normal limits. Increased pulmonary interstitial opacity, symmetric in both lungs. No pneumothorax. No pleural effusion or consolidation. No acute osseous abnormality identified. IMPRESSION: 1. Increased bilateral pulmonary interstitial opacity. Top differential considerations include acute pulmonary interstitial edema and viral/atypical respiratory infection. No pleural effusion. 2. Stable cardiomegaly. Aortic Atherosclerosis (ICD10-I70.0). Electronically Signed   By: Genevie Ann M.D.   On: 12/08/2018 21:17   US Abdomen Limited Ruq  Result Date: 12/24/2018 CLINICAL DATA:  Elevated liver function tests. Status post prior cholecystectomy. EXAM: ULTRASOUND ABDOMEN LIMITED RIGHT UPPER QUADRANT COMPARISON:  CT of the abdomen and pelvis on 10/28/2018 FINDINGS: Gallbladder: Surgically absent. Common bile duct: Diameter: 5 mm. Liver: The liver parenchyma is mildly heterogeneous and the surface contour may be very mildly nodular. Early cirrhosis cannot be excluded. No focal liver lesions or evidence of intrahepatic biliary ductal dilatation. Portal vein is patent on color Doppler imaging without thrombus and demonstrates normal direction of blood flow towards the liver. IMPRESSION: Mildly heterogeneous liver parenchyma with suggestion of mild nodularity of the surface contour. Early cirrhosis cannot be excluded. Electronically Signed   By: Aletta Edouard M.D.   On: 12/24/2018 09:30     ASSESSMENT AND PLAN:   85 year old female with a history of CAD status post PCI, chronic combined systolic and diastolic heart failure, PAF status post AV node ablation and chronic kidney disease stage IV who presented to emergency room due to shortness of breath.  1.  Acute on  chronic combined systolic and diastolic heart failure: Continue Lasix 60 mg IV twice daily Monitor I/o Appreciate cardiology consult ECHO shows shows normal LVEF with dilated and hypokinetic RV.  There is mild AS, mild to moderate MR, and moderate to severe TR.  Moderate to severe PH also noted.  2. Elevated LFts in the setting of hepatic consgestion, possible early cirrhosis as per ABD ultrasound. Follow CMP and will need outpatient GI consult   3. NSTEMI: Continue IV heparin.  Troponin max is 5.56 now trending down. Continue aspirin.  Statin fibroid on hold due to the setting of elevated LFTs. Cardiology is not recommending cardiac catheterization at this time giving multiple comorbidities and possible worsening of renal function in the setting of chronic kidney disease stage IV. Device interrogation did not reveal ventricular tachyarrhythmia.  4.  Permanent atrial fibrillation: She has been off of anticoagulation due to prior GI bleed. Continue heparin drip for now Continue Toprol-XL  5.  Shortness of breath: COVID-19 testing was negative.  Will order VQ scan when patient able to lie flat.  PT consult pending   Management plans discussed with the patient and she is in agreement.  CODE STATUS: full  TOTAL TIME TAKING CARE OF THIS PATIENT: 28 minutes.     POSSIBLE D/C 3-5 days, DEPENDING ON CLINICAL CONDITION.   Bettey Costa M.D on 12/25/2018 at 12:58 PM  Between 7am to 6pm - Pager - [phone] After 6pm go to [email or web address] - password [email or web address] [facility]  Office  [phone]  CC: Primary care physician; Tracie Harrier, MD  Note: This dictation was prepared with Dragon dictation along with smaller phrase technology. Any transcriptional errors that result from this process are unintentional.

## 2018-12-26 DIAGNOSIS — I509 Heart failure, unspecified: Secondary | ICD-10-CM

## 2018-12-26 DIAGNOSIS — I214 Non-ST elevation (NSTEMI) myocardial infarction: Secondary | ICD-10-CM

## 2018-12-26 LAB — COMPREHENSIVE METABOLIC PANEL
ALT: 450 U/L — ABNORMAL HIGH (ref 0–44)
AST: 469 U/L — ABNORMAL HIGH (ref 15–41)
Albumin: 2.9 g/dL — ABNORMAL LOW (ref 3.5–5.0)
Alkaline Phosphatase: 63 U/L (ref 38–126)
Anion gap: 16 — ABNORMAL HIGH (ref 5–15)
BUN: 86 mg/dL — ABNORMAL HIGH (ref 8–23)
CO2: 37 mmol/L — ABNORMAL HIGH (ref 22–32)
Calcium: 8 mg/dL — ABNORMAL LOW (ref 8.9–10.3)
Chloride: 88 mmol/L — ABNORMAL LOW (ref 98–111)
Creatinine, Ser: 2.22 mg/dL — ABNORMAL HIGH (ref 0.44–1.00)
GFR calc Af Amer: 23 mL/min — ABNORMAL LOW (ref >60)
GFR calc non Af Amer: 20 mL/min — ABNORMAL LOW (ref >60)
Glucose, Bld: 140 mg/dL — ABNORMAL HIGH (ref 70–99)
Potassium: 3.5 mmol/L (ref 3.5–5.1)
Sodium: 141 mmol/L (ref 135–145)
Total Bilirubin: 2.3 mg/dL — ABNORMAL HIGH (ref 0.3–1.2)
Total Protein: 6.1 g/dL — ABNORMAL LOW (ref 6.5–8.1)

## 2018-12-26 LAB — CBC
HCT: 31.9 % — ABNORMAL LOW (ref 36.0–46.0)
Hemoglobin: 9.5 g/dL — ABNORMAL LOW (ref 12.0–15.0)
MCH: 24.7 pg — ABNORMAL LOW (ref 26.0–34.0)
MCHC: 29.8 g/dL — ABNORMAL LOW (ref 30.0–36.0)
MCV: 83.1 fL (ref 80.0–100.0)
Platelets: 120 10*3/uL — ABNORMAL LOW (ref 150–400)
RBC: 3.84 MIL/uL — ABNORMAL LOW (ref 3.87–5.11)
RDW: 18.9 % — ABNORMAL HIGH (ref 11.5–15.5)
WBC: 8.3 10*3/uL (ref 4.0–10.5)
nRBC: 0 % (ref 0.0–0.2)

## 2018-12-26 LAB — HEPARIN LEVEL (UNFRACTIONATED): Heparin Unfractionated: 0.36 IU/mL (ref 0.30–0.70)

## 2018-12-26 LAB — CK: Total CK: 47 U/L (ref 38–234)

## 2018-12-26 LAB — MAGNESIUM: Magnesium: 1.9 mg/dL (ref 1.7–2.4)

## 2018-12-26 MED ORDER — POTASSIUM CHLORIDE CRYS ER 20 MEQ PO TBCR
20.0000 meq | EXTENDED_RELEASE_TABLET | Freq: Two times a day (BID) | ORAL | Status: AC
  Administered 2018-12-26: 21:00:00 20 meq via ORAL
  Filled 2018-12-26 (×2): qty 1

## 2018-12-26 MED ORDER — GLUCERNA SHAKE PO LIQD: 237.0000 mL | Freq: Three times a day (TID) | ORAL | Status: DC

## 2018-12-26 MED ORDER — ADULT MULTIVITAMIN W/MINERALS CH
1.0000 | ORAL_TABLET | Freq: Every day | ORAL | Status: DC
  Administered 2018-12-27 – 2018-12-28 (×2): 1 via ORAL
  Filled 2018-12-26 (×2): qty 1

## 2018-12-26 NOTE — Progress Notes (Signed)
[place] at [place] NAME: Regina Burke    MR#:  [id number]  DATE OF BIRTH:  [birth year]  SUBJECTIVE:   still feeling SOB this am Denies CP  REVIEW OF SYSTEMS:    Review of Systems  Constitutional: Negative for fever, chills weight loss HENT: Negative for ear pain, nosebleeds, congestion, facial swelling, rhinorrhea, neck pain, neck stiffness and ear discharge.   Respiratory: Negative for cough, ++ shortness of breath, no wheezing  Cardiovascular: Negative for chest pain, palpitations and ++ leg swelling.  Gastrointestinal: Negative for heartburn, abdominal pain, vomiting, diarrhea or consitpation Genitourinary: Negative for dysuria, urgency, frequency, hematuria Musculoskeletal: Negative for back pain or joint pain Neurological: Negative for dizziness, seizures, syncope, focal weakness,  numbness and headaches.  Hematological: Does not bruise/bleed easily.  Psychiatric/Behavioral: Negative for hallucinations, confusion, dysphoric mood    Tolerating Diet:yes      DRUG ALLERGIES:   Allergies  Allergen Reactions  . Ciprofloxacin Shortness Of Breath, Itching and Rash  . Doxycycline Shortness Of Breath, Itching and Rash  . Penicillins Shortness Of Breath, Itching, Rash and Other (See Comments)    Has patient had a PCN reaction causing immediate rash, facial/tongue/throat swelling, SOB or lightheadedness with hypotension: Yes Has patient had a PCN reaction causing severe rash involving mucus membranes or skin necrosis: No Has patient had a PCN reaction that required hospitalization No Has patient had a PCN reaction occurring within the last 10 years: No If all of the above answers are "NO", then may proceed with Cephalosporin use.  . Sulfa Antibiotics Shortness Of Breath, Itching and Rash  . Latex Itching  . Morphine And Related Itching  . Albuterol Sulfate   . Macrobid [Nitrofurantoin Macrocrystal] Itching  . Cefuroxime Rash   Blisters in mouth    VITALS:  Blood pressure (!) 139/113, pulse 78, temperature 98.2 F (36.8 C), temperature source Oral, resp. rate 19, height 5\' 6"  (1.676 m), weight 66.2 kg, SpO2 100 %.  PHYSICAL EXAMINATION:  Constitutional: Appears well-developed and well-nourished. No distress. HENT: Normocephalic. Marland Kitchen Oropharynx is clear and moist.  Eyes: Conjunctivae and EOM are normal. PERRLA, no scleral icterus.  Neck: Normal ROM. Neck supple. ++ JVD. No tracheal deviation. CVS: irr, irr, S1/S2 +, 2/6 murmurs, no gallops, no carotid bruit.  Pulmonary: b/l crackles at bases Abdominal: Soft. BS +,  no distension, tenderness, rebound or guarding.  Musculoskeletal: Normal range of motion. No edema and no tenderness.  Neuro: Alert. CN 2-12 grossly intact. No focal deficits. Skin: Skin is warm and dry. No rash noted. Psychiatric: Normal mood and affect.      LABORATORY PANEL:   CBC Recent Labs  Lab 12/26/18 0350  WBC 8.3  HGB 9.5*  HCT 31.9*  PLT 120*   ------------------------------------------------------------------------------------------------------------------  Chemistries  Recent Labs  Lab 12/26/18 0350  NA 141  K 3.5  CL 88*  CO2 37*  GLUCOSE 140*  BUN 86*  CREATININE 2.22*  CALCIUM 8.0*  MG 1.9  AST 469*  ALT 450*  ALKPHOS 63  BILITOT 2.3*   ------------------------------------------------------------------------------------------------------------------  Cardiac Enzymes Recent Labs  Lab 11/28/2018 2045 12/24/18 0229 12/24/18 0721  TROPONINI 5.35* 5.56* 5.02*   ------------------------------------------------------------------------------------------------------------------  RADIOLOGY:  Nm Pulmonary Perfusion  Result Date: 12/25/2018 CLINICAL DATA:  Shortness of breath EXAM: NUCLEAR MEDICINE  PERFUSION SCAN VIEWS: Anterior, posterior, left lateral, right lateral, RPO, LPO, RAO, LAO RADIOPHARMACEUTICALS:  4.31 mCi Tc58m MAA-IV COMPARISON:  Chest radiograph  December 25, 2018 FINDINGS: Perfusion: There is  cardiomegaly. Mild photopenia in the left base region corresponds to an area of consolidation on the chest radiograph. Elsewhere there are a few scattered subcentimeter areas of decreased radiotracer uptake which are felt to be consistent with areas of mild scarring. Photopenia in the right apex corresponds to an area of apparent consolidation/potential mass. No segmental level perfusion defects without chest radiographic abnormality noted. IMPRESSION: There are areas of decreased attenuation in the right apex and left base regions which corresponded chest radiographic abnormalities. There is no segmental or significant subsegmental level perfusion defect without corresponding chest radiographic abnormality. This study is overall consistent with a relatively low probability of pulmonary embolus. Electronically Signed   By: Lowella Grip III M.D.   On: 12/25/2018 14:53   Dg Chest Port 1 View  Result Date: 12/25/2018 CLINICAL DATA:  Shortness of breath, history of lung cancer EXAM: PORTABLE CHEST 1 VIEW COMPARISON:  11/27/2018 FINDINGS: Bilateral mild interstitial thickening. Small bilateral pleural effusions. No focal consolidation or pneumothorax. Stable right upper lobe opacity consistent with area of known treated malignancy. Stable cardiomegaly. Dual lead cardiac pacemaker. Thoracic aortic atherosclerosis. No acute osseous abnormality. IMPRESSION: 1. Cardiomegaly with mild pulmonary vascular congestion. 2. Stable right upper lobe airspace opacity consistent with known treated malignancy. Electronically Signed   By: Kathreen Devoid   On: 12/25/2018 14:11     ASSESSMENT AND PLAN:   85 year old female with a history of CAD status post PCI, chronic combined systolic and diastolic heart failure, PAF status post AV node ablation and chronic kidney disease stage IV who presented to emergency room due to shortness of breath.  1.  Acute on chronic combined systolic  and diastolic heart failure: Continue Lasix 60 mg IV twice daily Monitor I/O Appreciate cardiology consult ECHO shows shows normal LVEF with dilated and hypokinetic RV.  There is mild AS, mild to moderate MR, and moderate to severe TR.  Moderate to severe PH also noted. Continue Toprol  2. Elevated LFts in the setting of hepatic consgestion, possible early cirrhosis as per ABD ultrasound. LFTs are improving.  Continue to hold statin and fenofibrate.   CMP in am   3. Elevated troponin not c/w NSTEMI/ACS as per Cardiology. Troponin elevation in the setting of demand ischemia with CHF and CKD. Plan to discontinue IV heparin today.  Troponin max 5.56 abd has trendeddown. Continue aspirin.  Statin and fenofibrate on hold due to the setting of elevated LFTs. Cardiology is not recommending cardiac catheterization at this time giving multiple comorbidities and possible worsening of renal function in the setting of chronic kidney disease stage IV. Device interrogation did not reveal ventricular tachyarrhythmia.  4.  Permanent atrial fibrillation: She has been off of anticoagulation due to prior GI bleed. Continue heparin drip for now Continue Toprol-XL No significant arrhythmia per interrogation of device.  5.  Shortness of breath: COVID-19 testing was negative.   VQ scan low probability for PE.  Shortness of breath is due to CHF exacerbation.  PT consult pending   Management plans discussed with the patient and she is in agreement.  CODE STATUS: full  TOTAL TIME TAKING CARE OF THIS PATIENT: 27 minutes.   Discussed with son yesterday and will call son later today.  POSSIBLE D/C 2-3 days, DEPENDING ON CLINICAL CONDITION.   Bettey Costa M.D on 12/26/2018 at 11:33 AM  Between 7am to 6pm - Pager - [phone] After 6pm go to [email or web address] - password [email or web address] [facility]  Office  [phone]  CC: Primary  care physician; Tracie Harrier, MD  Note: This  dictation was prepared with Dragon dictation along with smaller phrase technology. Any transcriptional errors that result from this process are unintentional.

## 2018-12-26 NOTE — Progress Notes (Addendum)
Progress Note  Patient Name: Regina Burke Date of Encounter: 12/26/2018  Primary Cardiologist: Ida Rogue, MD   Subjective   Patient denied CP but continues to report significant SOB, orthopnea, and cough, which is only mildly productive at this time. She reports abdominal pain and that she does not feel well but is unable to provide additional detail on ROS.  Inpatient Medications    Scheduled Meds: . aspirin EC  81 mg Oral Daily  . busPIRone  7.5 mg Oral BID  . feeding supplement (ENSURE ENLIVE)  237 mL Oral TID WC  . fluticasone furoate-vilanterol  1 puff Inhalation Daily  . furosemide  60 mg Intravenous BID  . metoprolol succinate  25 mg Oral Daily  . sertraline  50 mg Oral Daily  . sodium chloride flush  3 mL Intravenous Q12H  . vitamin B-12  1,000 mcg Oral Daily   Continuous Infusions: . sodium chloride    . heparin 2,100 Units/hr (12/25/18 2224)   PRN Meds: sodium chloride, acetaminophen, levalbuterol, nitroGLYCERIN, ondansetron (ZOFRAN) IV, sodium chloride flush, tiotropium, traMADol   Vital Signs    Vitals:   12/25/18 1928 12/25/18 2220 12/26/18 0530 12/26/18 0717  BP: (!) 143/74 (!) 162/60 (!) 145/55 135/70  Pulse: 70 69 70 69  Resp: 18  20 19   Temp: 98.4 F (36.9 C) 98 F (36.7 C) 97.8 F (36.6 C) 98.2 F (36.8 C)  TempSrc: Oral Oral Oral Oral  SpO2: 95% 96% 95% 100%  Weight:   66.2 kg   Height:        Intake/Output Summary (Last 24 hours) at 12/26/2018 0931 Last data filed at 12/26/2018 0530 Gross per 24 hour  Intake 0 ml  Output 1630 ml  Net -1630 ml   Filed Weights   11/28/2018 1847 12/25/18 0519 12/26/18 0530  Weight: 80 kg 81.1 kg 66.2 kg    Telemetry     Afib with ventricular paced rhythm - Personally Reviewed  ECG    No new tracings - Personally Reviewed  Physical Exam   GEN: No acute distress -frequent coughing and frustration that not a productive cough.  Neck: HJR not assessed as patient TTP in abdominal area, JVP ~9cm   Cardiac: RRR, 2/6 systolic murmur clearest in RUSB, no rubs, gallops.  Respiratory: Bibasilar diminished breath sounds. Accessory respiratory muscle use.  GI: Soft, TTP especially at RUQ, non-distended  MS: Mild bilateral lower extremity edema; No deformity. Neuro:  Nonfocal  Psych: Normal affect   Labs    Chemistry Recent Labs  Lab 11/28/2018 2045 12/25/18 0558 12/26/18 0350  NA 140 141 141  K 3.8 3.8 3.5  CL 87* 89* 88*  CO2 29 34* 37*  GLUCOSE 129* 113* 140*  BUN 62* 87* 86*  CREATININE 2.99* 2.77* 2.22*  CALCIUM 8.1* 8.1* 8.0*  PROT 7.2 6.4* 6.1*  ALBUMIN 3.6 3.0* 2.9*  AST 1,975* 963* 469*  ALT 607* 571* 450*  ALKPHOS 81 69 63  BILITOT 3.0* 2.8* 2.3*  GFRNONAA 14* 15* 20*  GFRAA 16* 18* 23*  ANIONGAP 24* 18* 16*     Hematology Recent Labs  Lab 11/27/2018 2045 12/25/18 0558 12/26/18 0350  WBC 12.9* 5.8 8.3  RBC 3.99 4.04 3.84*  HGB 10.0* 10.0* 9.5*  HCT 34.0* 33.4* 31.9*  MCV 85.2 82.7 83.1  MCH 25.1* 24.8* 24.7*  MCHC 29.4* 29.9* 29.8*  RDW 18.5* 18.8* 18.9*  PLT 167 139* 120*    Cardiac Enzymes Recent Labs  Lab 12/19/2018 2045 12/24/18  0229 12/24/18 0721  TROPONINI 5.35* 5.56* 5.02*   No results for input(s): TROPIPOC in the last 168 hours.   BNP Recent Labs  Lab 12/15/2018 2049  BNP 2,346.0*     DDimer No results for input(s): DDIMER in the last 168 hours.   Radiology    Nm Pulmonary Perfusion  Result Date: 12/25/2018 CLINICAL DATA:  Shortness of breath EXAM: NUCLEAR MEDICINE  PERFUSION SCAN VIEWS: Anterior, posterior, left lateral, right lateral, RPO, LPO, RAO, LAO RADIOPHARMACEUTICALS:  4.31 mCi Tc20m MAA-IV COMPARISON:  Chest radiograph December 25, 2018 FINDINGS: Perfusion: There is cardiomegaly. Mild photopenia in the left base region corresponds to an area of consolidation on the chest radiograph. Elsewhere there are a few scattered subcentimeter areas of decreased radiotracer uptake which are felt to be consistent with areas of mild  scarring. Photopenia in the right apex corresponds to an area of apparent consolidation/potential mass. No segmental level perfusion defects without chest radiographic abnormality noted. IMPRESSION: There are areas of decreased attenuation in the right apex and left base regions which corresponded chest radiographic abnormalities. There is no segmental or significant subsegmental level perfusion defect without corresponding chest radiographic abnormality. This study is overall consistent with a relatively low probability of pulmonary embolus. Electronically Signed   By: Lowella Grip III M.D.   On: 12/25/2018 14:53   Dg Chest Port 1 View  Result Date: 12/25/2018 CLINICAL DATA:  Shortness of breath, history of lung cancer EXAM: PORTABLE CHEST 1 VIEW COMPARISON:  12/04/2018 FINDINGS: Bilateral mild interstitial thickening. Small bilateral pleural effusions. No focal consolidation or pneumothorax. Stable right upper lobe opacity consistent with area of known treated malignancy. Stable cardiomegaly. Dual lead cardiac pacemaker. Thoracic aortic atherosclerosis. No acute osseous abnormality. IMPRESSION: 1. Cardiomegaly with mild pulmonary vascular congestion. 2. Stable right upper lobe airspace opacity consistent with known treated malignancy. Electronically Signed   By: Kathreen Devoid   On: 12/25/2018 14:11    Cardiac Studies   2D Echo 12/24/2018: 1. The left ventricle has normal systolic function with an ejection fraction of 60-65%. The cavity size was normal. There is mildly increased left ventricular wall thickness. Left ventricular diastolic function could not be evaluated secondary to atrialfibrillation. There is right ventricular volume and pressure overload. 2. The right ventricle has moderately reduced systolic function. The cavity was moderately enlarged. There is moderately increased right ventricular wall thickness. Right ventricular systolic pressure is moderately to severely elevated with an  estimatedpressure of 67 mmHg. 3. Left atrial size was severely dilated. 4. Right atrial size was moderately dilated. 5. Mildly thickened tricuspid valve leaflets. 6. The mitral valve is degenerative. Mild thickening of the mitral valve leaflet. There is moderate mitral annular calcification present. Mitral valve regurgitation is mild to moderate by color flow Doppler. 7. The tricuspid valve is abnormal. Tricuspid valve regurgitation is moderate-severe. 8. The aortic valve is tricuspid. Moderate thickening of the aortic valve. Mild calcification of the aortic valve. Aortic valve regurgitation is trivial by color flow Doppler. Mild stenosis of the aortic valve. 9. The inferior vena cava was dilated in size with <50% respiratory variability. 10. The interatrial septum appears to be lipomatous. 11. Pericardial thickening is suspected. __________  Device interrogation 12/24/2018: Device: Medtronic Percepta Quad CRT-P Mode: VVIR Lower rate: 70 bpm Upper sensor: 110 bpm VT monitor: 150 bpm  Impedence: RV 494, LV 494 Capture threshold: RV 0.625 V @ 0.40 ms Pulse amplitude/width: RV 2.5 V/0.40 ms, LV 2.5 V/1.5 ms Sensitivity: RV 4.00 mV  Events: None.  VP: 100%  OptiVol Alert: Fluid accumulation ongoing sinice 12/16/2018.  No programming changes made.  Patient Profile     85 y.o. female with a h/o CAD s/p PCI to [place] (2016) in setting of NSTEMI, chronic systolic and diastolic HF, permanent Afib s/p AV node ablation and BiV PPM, CKDIV, chronic respiratory failure with hypoxia and hypercapnia 2/2 COPD, HTN, HLD, DM2, chronic anemia with prior GIB and who is being seen for SOB with elevated troponin.   Assessment & Plan    Acute on chronic HFpEF/ right heart failure - Still mildly volume overloaded on exam. C/o SOB, orthopnea, cough. - Right heart failure with evidence of liver injury with yesterday's scan showing low probability for PE. - Continue IV lasix 60mg  BID with close  monitoring of Scr. Renal function improvement overnight with diuresis. Continue Toprol. Continue to monitor I/O, daily weights. -1.6L yesterday. Accuracy of weights uncertain at this time given labile weights and inconsistent with recorded net output.  HTN - BP not well controlled this admission but improved this AM and with diuresis. Consider increase in BB if continued elevated BP but with consideration with hypotension documented earlier in admission.  Hypokalemia - Potassium 3.5. Repleting with goal 4.0. Check Mg with goal 2.0.   NSTEMI with history of CAD involving the native coronary arteries  - No CP currently or during the admission. Troponin elevation flat trending and with peak of 5.56- now downtrending.  - Elevation thought more consistent with supply demand mismatch in setting of HFpEF, CKD, and acute illness with multiorgan dysfunction versus acute plaque rupture MI. Cannot completely rule out cardiac etiology with history of 2016 PCI; therefore, continue ASA as below. Echo as above showed normal LVEF and no significant WMA. Low probability for PE per perfusion imagining. Will discontinue heparin.  - No plan for further ischemic workup including cardiac cath at this time given comorbid conditions. Poor candidate for cardiac catheterization.  - Continue medical therapy with ASA, Toprol. Continue holding lipitor and fenofibrate given acute liver injury.   Permanent Afib - Ventricular pacing. No significant arrhythmia per interrogation of device.  - Will discontinue heparin as imaging shows low probability for PE.  - Not an ideal candidate for long term [place] given prior GIB and h/o medication noncompliance. Continue Toprol XL with current rates controlled.  Acute liver failure and abdominal pain - Evidence of transaminitis, elevated bilirubin, INR. Consider etiologies of congestive right HF, hepatic infarcts in setting of permanent Afib not on anticoagulation, viral hepatitis, medication  induced, transient hypotension. Consider abdominal pain d/t postherpetic neuralgia. - Continue to hold Lipitor and fenofibrate as above. Avoid additional hepatotoxic agents. Close monitoring of liver function. Consider GI consult. Per IM.  Acute on CKDIII-IV - Baseline Cr 2-2.5. Cr 2.77  2.22 and improving with diuresis. Anemia likely of chronic disease. Avoid nephrotoxins. Spironolactone discontinued earlier in admission d/t renal function.   Anemia - Daily CBC. Monitor as on ASA. Likely anemia of chronic disease.  SOB/cough - Etiology unclear at this time with h/o COPD and HFpEF. COVID-19 testing negative. Consider PE rule out as above with VQ scan. Further workup and consideration of abx per IM.    For questions or updates, please contact [place] Please consult [email or web address] for contact info under        Signed, Arvil Chaco, PA-C  12/26/2018, 9:31 AM

## 2018-12-26 NOTE — Progress Notes (Signed)
ANTICOAGULATION CONSULT NOTE - Initial Consult  Pharmacy Consult for Heparin  Indication: chest pain/ACS  Allergies  Allergen Reactions  . Ciprofloxacin Shortness Of Breath, Itching and Rash  . Doxycycline Shortness Of Breath, Itching and Rash  . Penicillins Shortness Of Breath, Itching, Rash and Other (See Comments)    Has patient had a PCN reaction causing immediate rash, facial/tongue/throat swelling, SOB or lightheadedness with hypotension: Yes Has patient had a PCN reaction causing severe rash involving mucus membranes or skin necrosis: No Has patient had a PCN reaction that required hospitalization No Has patient had a PCN reaction occurring within the last 10 years: No If all of the above answers are "NO", then may proceed with Cephalosporin use.  . Sulfa Antibiotics Shortness Of Breath, Itching and Rash  . Latex Itching  . Morphine And Related Itching  . Albuterol Sulfate   . Macrobid [Nitrofurantoin Macrocrystal] Itching  . Cefuroxime Rash    Blisters in mouth    Patient Measurements: Height: 5\' 6"  (167.6 cm) Weight: 178 lb 12.7 oz (81.1 kg) IBW/kg (Calculated) : 59.3 Heparin Dosing Weight:  75.9 kg   Vital Signs: Temp: 98 F (36.7 C) (04/29 2220) Temp Source: Oral (04/29 2220) BP: 162/60 (04/29 2220) Pulse Rate: 69 (04/29 2220)  Labs: Recent Labs    12/14/2018 2045 12/24/18 0229 12/24/18 0721  12/25/18 0558 12/25/18 1707 12/26/18 0350  HGB 10.0*  --   --   --  10.0*  --  9.5*  HCT 34.0*  --   --   --  33.4*  --  31.9*  PLT 167  --   --   --  139*  --  120*  APTT 44*  --   --   --   --   --   --   LABPROT 19.6*  --   --   --   --   --   --   INR 1.7*  --   --   --   --   --   --   HEPARINUNFRC  --   --  <0.10*   < > <0.10* <0.10* 0.36  CREATININE 2.99*  --   --   --  2.77*  --  2.22*  CKTOTAL 470*  --   --   --   --   --  47  TROPONINI 5.35* 5.56* 5.02*  --   --   --   --    < > = values in this interval not displayed.    Estimated Creatinine  Clearance: 20.6 mL/min (A) (by C-G formula based on SCr of 2.22 mg/dL (H)).   Medications:  Medications Prior to Admission  Medication Sig Dispense Refill Last Dose  . ALPRAZolam (XANAX) 0.5 MG tablet Take 0.5 mg by mouth at bedtime as needed for sleep.    12/22/2018 at prn  . atorvastatin (LIPITOR) 20 MG tablet Take 20 mg by mouth daily.   12/22/2018 at Unknown  . busPIRone (BUSPAR) 7.5 MG tablet Take 7.5 mg by mouth 2 (two) times daily.   12/22/2018 at Unknown  . fenofibrate (TRICOR) 48 MG tablet Take 48 mg by mouth daily.    12/22/2018 at Unknown  . levalbuterol (XOPENEX) 1.25 MG/0.5ML nebulizer solution Take 1.25 mg by nebulization every 6 (six) hours as needed for wheezing or shortness of breath. 1 each 12 prn at prn  . metoprolol succinate (TOPROL XL) 25 MG 24 hr tablet Take 25 mg by mouth daily.    12/22/2018 at  Unknown  . ondansetron (ZOFRAN-ODT) 4 MG disintegrating tablet Take 1 tablet by mouth every 8 (eight) hours as needed.    prn at prn  . sertraline (ZOLOFT) 50 MG tablet Take 1 tablet (50 mg total) by mouth daily. 30 tablet 2 11/27/2018 at Unknown  . spironolactone (ALDACTONE) 25 MG tablet Take 25 mg by mouth daily.  3 12/22/2018 at Unknown  . torsemide (DEMADEX) 20 MG tablet Take 40 mg by mouth daily.  3 12/22/2018 at Unknown  . traMADol (ULTRAM) 50 MG tablet Take 50 mg by mouth 2 (two) times daily.   3 12/22/2018 at Unknown time  . Aclidinium Bromide 400 MCG/ACT AEPB Inhale into the lungs.   Taking  . feeding supplement, ENSURE ENLIVE, (ENSURE ENLIVE) LIQD Take 237 mLs by mouth 3 (three) times daily with meals. 237 mL 12 Taking  . FERREX 150 150 MG capsule Take 150 mg by mouth daily.   Not Taking at Unknown time  . fluticasone furoate-vilanterol (BREO ELLIPTA) 100-25 MCG/INH AEPB Inhale 1 puff into the lungs daily.    Not Taking at Unknown  . ipratropium (ATROVENT) 0.06 % nasal spray USE 2 SPRAY(S) IN EACH NOSTRIL TWICE DAILY   Not Taking at Unknown time  . nitroGLYCERIN (NITROSTAT) 0.4  MG SL tablet Place 1 tablet (0.4 mg total) under the tongue every 5 (five) minutes as needed for chest pain. (Patient not taking: Reported on 12/24/2018) 25 tablet 3 Not Taking at prn  . tiotropium (SPIRIVA) 18 MCG inhalation capsule Place 18 mcg into inhaler and inhale daily as needed (shortness of breath).    Not Taking at Unknown  . vitamin B-12 (CYANOCOBALAMIN) 1000 MCG tablet Take 1 tablet by mouth daily.   Not Taking at Unknown time    Assessment: Pharmacy was consulted for heparin infusion dosing and monitoring in 85 yo female admitted for ACS/STEMI. No reported anticoagulants PTA. Baseline Hgb 10.0, PLT 167, INR elevated at 1.7 (noted h/o hepatic failure), aPTT 46s.   Goal of Therapy:  Heparin level 0.3-0.7 units/ml Monitor platelets by anticoagulation protocol: Yes   Heparin Course: 4/28 AM initiation: 4000 unit bolus, then 900 units/hr 4/29 0721 HL < 0.10: 2000 unit bolus, then inc to 1200 units/hr 4/28 1954 HL<0.10: 2300 unit bolus, then inc to 1500 units/hr 4/29  1707 HL <0.10. heparin infusion increased to 2100 units/hr 4/30 0350 HL 0.36   Plan:  4/30 0350 HL 0.36. Level therapeutic x 1.  Continue current in fusion rate of 2100 units/hr Recheck confirmatory level in 8 hours. Continue to monitor CBC and heparin levels daily while on heparin.  --Continue to monitor H&H and platelets  Pernell Dupre, PharmD, BCPS Clinical Pharmacist 12/26/2018 4:53 AM

## 2018-12-26 NOTE — Progress Notes (Signed)
Initial Nutrition Assessment  DOCUMENTATION CODES:   Not applicable  INTERVENTION:   Glucerna Shake po TID, each supplement provides 220 kcal and 10 grams of protein  MVI daily   NUTRITION DIAGNOSIS:   Inadequate oral intake related to acute illness as evidenced by meal completion < 25%.  GOAL:   Patient will meet greater than or equal to 90% of their needs  MONITOR:   PO intake, Supplement acceptance, Labs, Weight trends, I & O's, Skin  REASON FOR ASSESSMENT:   Consult Diet education  ASSESSMENT:   85 y.o. female with history of coronary artery disease status post PCI to [facility] 3 in 2016 in setting of NSTEMI, chronic systolic and diastolic heart failure, permanent atrial fibrillation status post AV node ablation and biventricular pacemaker, chronic kidney disease stage IV, chronic respiratory failure with hypoxia and hypercapnia secondary to COPD, hypertension, hyperlipidemia, type 2 diabetes mellitus, and chronic anemia with prior GI bleed who is admitted with shortness of breath and elevated troponin   RD working remotely.  Unable to reach patient via phone x 2 attempts. Per chart review, pt eating only sips and bites of meals. Pt is also refusing most Ensure supplements. RD suspects pt with poor oral intake pta r/t SOB. RD will change Ensure over to Glucerna as this has more flavors available and is lower in protein (pt with CKD IV). Per chart, pt appears fairly weight stable pta. RD will attempt to provide CHF education to pt prior to discharge.    Medications reviewed and include: aspirin, lasix, KCl, B12  Labs reviewed: Cl 88(L), BUN 86(H), creat 2.22(H), Mg 1.9 wnl, AST 469(H), ALT 450(H), tbili 2.3(H) Hgb 9.5(L), Hct 31.9(L), MCH 24.7(L), MCHC 29.8(L)  Unable to complete Nutrition-Focused physical exam at this time.   Diet Order:   Diet Order            DIET DYS 3 Room service appropriate? Yes; Fluid consistency: Thin; Fluid restriction: 1500 mL Fluid  Diet  effective now             EDUCATION NEEDS:   Not appropriate for education at this time  Skin:  Skin Assessment: Reviewed RN Assessment(ecchymosis, petechiae, MASD)  Last BM:  4/28- type 7  Height:   Ht Readings from Last 1 Encounters:  12/05/2018 5\' 6"  (1.676 m)    Weight:   Wt Readings from Last 1 Encounters:  12/26/18 66.2 kg    Ideal Body Weight:  59 kg  BMI:  Body mass index is 23.55 kg/m.  Estimated Nutritional Needs:   Kcal:  1700-1900kcal/day   Protein:  80-90g/day   Fluid:  1.5L/day   Koleen Distance MS, RD, LDN Pager #- [phone] Office#- [phone] After Hours Pager: [phone]

## 2018-12-26 NOTE — Progress Notes (Signed)
PT Cancellation Note  Patient Details Name: Regina Burke MRN: [id number] DOB: October 13, 1932   Cancelled Treatment:    Reason Eval/Treat Not Completed: Other (comment)(Per RN, pt with very recent increase to 6L via [place] due to desaturations. Pt very fatigued and mildly SOB at this time. RN requesting PT to hold at this time, PT will follow up as able.)  Lieutenant Diego PT, DPT 11:40 AM,12/26/18 [phone]

## 2018-12-26 NOTE — Evaluation (Addendum)
Physical Therapy Evaluation Patient Details Name: Regina Burke MRN: [id number] DOB: 1933-05-29 Today's Date: 12/26/2018   History of Present Illness  85 y.o. female with a h/o CAD s/p PCI in 2016 in setting of NSTEMI, chronic systolic and diastolic HF, permanent Afib s/p AV node ablation , CKDIV, chronic respiratory failure with hypoxia and hypercapnia 2/2 COPD, HTN, HLD, DM2, chronic anemia with prior GIB and who is being seen for SOB with elevated troponin.     Clinical Impression  Patient slow to respond throughout session, lethargic, intermittently closed eyes but able to open on command and able to initiate/attempt PT requests. Reported that she lives with her son in a one story home, STE, uses a RW at baseline, assistance needed for ADLs and dependent for IADLs.   Patient exhibited generalized weakness of both UE and LE upon assessment, Unable to move LE without physical assist provided. spO2 and HR monitored throughout session, >92% with bed mobility and a few therapeutic exercises. Pt performed supine <> sit with maxAx2, able to initiate movement, reach for bed rails. The patient was able to sit EOB with feet unsupported and unilateral UE support for 1-57mins, and perform reaching within base of support exercises. CGA-supervision to maintain safety. Pt with very flexed trunk, mild exertion for breathing, and fatigued quickly. SpO2 >93% throughout sitting. Pt returned to supine, in NAD.  Overall the patient demonstrated deficits (see "PT Problem List") that impede the patient's functional abilities, safety, and mobility and would benefit from skilled PT intervention.  Recommendation is STR due to current level of assistance needed.    Follow Up Recommendations SNF    Equipment Recommendations  Other (comment)(TBD)    Recommendations for Other Services OT consult     Precautions / Restrictions Precautions Precautions: Fall Precaution Comments: watch O2 Restrictions Weight Bearing  Restrictions: No      Mobility  Bed Mobility Overal bed mobility: Needs Assistance Bed Mobility: Supine to Sit;Sit to Supine     Supine to sit: Max assist;+2 for physical assistance;+2 for safety/equipment;HOB elevated Sit to supine: Max assist;+2 for physical assistance;+2 for safety/equipment;HOB elevated      Transfers                 General transfer comment: deferred due to safety concerns  Ambulation/Gait                Stairs            Wheelchair Mobility    Modified Rankin (Stroke Patients Only)       Balance Overall balance assessment: Needs assistance   Sitting balance-Leahy Scale: Fair Sitting balance - Comments: CGA- supervision for sitting balance. able to perform reaching within base of support with close supervision/CGA                                     Pertinent Vitals/Pain Pain Assessment: No/denies pain    Home Living Family/patient expects to be discharged to:: Private residence Living Arrangements: Children Available Help at Discharge: Family;Available 24 hours/day Type of Home: House Home Access: Stairs to enter Entrance Stairs-Rails: Chemical engineer of Steps: 2 Home Layout: One level Home Equipment: Walker - 2 wheels      Prior Function Level of Independence: Needs assistance   Gait / Transfers Assistance Needed: needs assistance with ADLS, bathing set up (sponge baths) states she is able to dress herself. dependent for IADLs. ambulates  with RW     Comments: Endorses +10 falls     Hand Dominance   Dominant Hand: Right    Extremity/Trunk Assessment   Upper Extremity Assessment Upper Extremity Assessment: Generalized weakness;RUE deficits/detail;LUE deficits/detail RUE Deficits / Details: grossly 3+/5 LUE Deficits / Details: grossly 3+/5    Lower Extremity Assessment Lower Extremity Assessment: Generalized weakness;RLE deficits/detail;LLE deficits/detail RLE Deficits  / Details: unable to perform independent SLR or heel slides DF/PF in supine at least 2+/5 LLE Deficits / Details: unable to perform independent SLR or heel slides DF/PF in supine at least 2+/5       Communication   Communication: No difficulties  Cognition Arousal/Alertness: Lethargic Behavior During Therapy: Flat affect;WFL for tasks assessed/performed Overall Cognitive Status: No family/caregiver present to determine baseline cognitive functioning                                        General Comments      Exercises Other Exercises Other Exercises: seated reaching within base of support, cues for upright posture, pt trunk veryflexed, exhibited fatigue. 5-7 reaches bilaterally Other Exercises: SLR and heel slides AAROM bilaterally x10 constant  verbal and tactile cues to increase patient participation   Assessment/Plan    PT Assessment Patient needs continued PT services  PT Problem List Decreased strength;Decreased mobility;Decreased safety awareness;Decreased range of motion;Decreased knowledge of precautions;Decreased activity tolerance;Cardiopulmonary status limiting activity;Decreased balance;Decreased knowledge of use of DME       PT Treatment Interventions DME instruction;Functional mobility training;Balance training;Patient/family education;Gait training;Therapeutic activities;Neuromuscular re-education;Stair training;Therapeutic exercise    PT Goals (Current goals can be found in the Care Plan section)  Acute Rehab PT Goals Patient Stated Goal: to breathe better PT Goal Formulation: With patient Time For Goal Achievement: 01/09/19 Potential to Achieve Goals: Fair    Frequency Min 2X/week   Barriers to discharge        Co-evaluation               AM-PAC PT "6 Clicks" Mobility  Outcome Measure Help needed turning from your back to your side while in a flat bed without using bedrails?: Total Help needed moving from lying on your back to  sitting on the side of a flat bed without using bedrails?: Total Help needed moving to and from a bed to a chair (including a wheelchair)?: Total Help needed standing up from a chair using your arms (e.g., wheelchair or bedside chair)?: Total Help needed to walk in hospital room?: Total Help needed climbing 3-5 steps with a railing? : Total 6 Click Score: 6    End of Session Equipment Utilized During Treatment: Oxygen(3.5L) Activity Tolerance: Patient limited by fatigue;Patient limited by lethargy Patient left: in bed;with nursing/sitter in room;with call bell/phone within reach;with bed alarm set Nurse Communication: Mobility status PT Visit Diagnosis: Unsteadiness on feet (R26.81);Muscle weakness (generalized) (M62.81);Difficulty in walking, not elsewhere classified (R26.2);History of falling (Z91.81)    Time: 1443-1540 PT Time Calculation (min) (ACUTE ONLY): 28 min   Charges:   PT Evaluation $PT Eval Moderate Complexity: 1 Mod PT Treatments $Therapeutic Activity: 8-22 mins       Lieutenant Diego PT, DPT 4:27 PM,12/26/18 [phone]

## 2018-12-27 ENCOUNTER — Inpatient Hospital Stay: Payer: Medicare Other

## 2018-12-27 ENCOUNTER — Encounter: Payer: Self-pay | Admitting: Primary Care

## 2018-12-27 DIAGNOSIS — R0603 Acute respiratory distress: Secondary | ICD-10-CM

## 2018-12-27 DIAGNOSIS — Z7189 Other specified counseling: Secondary | ICD-10-CM

## 2018-12-27 DIAGNOSIS — Z515 Encounter for palliative care: Secondary | ICD-10-CM

## 2018-12-27 LAB — COMPREHENSIVE METABOLIC PANEL
ALT: 316 U/L — ABNORMAL HIGH (ref 0–44)
AST: 173 U/L — ABNORMAL HIGH (ref 15–41)
Albumin: 3.1 g/dL — ABNORMAL LOW (ref 3.5–5.0)
Alkaline Phosphatase: 65 U/L (ref 38–126)
Anion gap: 17 — ABNORMAL HIGH (ref 5–15)
BUN: 89 mg/dL — ABNORMAL HIGH (ref 8–23)
CO2: 37 mmol/L — ABNORMAL HIGH (ref 22–32)
Calcium: 8.8 mg/dL — ABNORMAL LOW (ref 8.9–10.3)
Chloride: 89 mmol/L — ABNORMAL LOW (ref 98–111)
Creatinine, Ser: 2.07 mg/dL — ABNORMAL HIGH (ref 0.44–1.00)
GFR calc Af Amer: 25 mL/min — ABNORMAL LOW (ref >60)
GFR calc non Af Amer: 22 mL/min — ABNORMAL LOW (ref >60)
Glucose, Bld: 127 mg/dL — ABNORMAL HIGH (ref 70–99)
Potassium: 3.5 mmol/L (ref 3.5–5.1)
Sodium: 143 mmol/L (ref 135–145)
Total Bilirubin: 3.3 mg/dL — ABNORMAL HIGH (ref 0.3–1.2)
Total Protein: 6.6 g/dL (ref 6.5–8.1)

## 2018-12-27 MED ORDER — LEVALBUTEROL HCL 0.63 MG/3ML IN NEBU
0.6300 mg | INHALATION_SOLUTION | Freq: Four times a day (QID) | RESPIRATORY_TRACT | Status: DC
  Administered 2018-12-27 – 2018-12-29 (×9): 0.63 mg via RESPIRATORY_TRACT
  Filled 2018-12-27 (×10): qty 3

## 2018-12-27 MED ORDER — POTASSIUM CHLORIDE CRYS ER 20 MEQ PO TBCR
20.0000 meq | EXTENDED_RELEASE_TABLET | Freq: Two times a day (BID) | ORAL | Status: AC
  Administered 2018-12-27: 20 meq via ORAL
  Filled 2018-12-27: qty 1

## 2018-12-27 NOTE — Progress Notes (Signed)
OT Cancellation Note  Patient Details Name: Regina Burke MRN: [id number] DOB: October 04, 1932   Cancelled Treatment:    Reason Eval/Treat Not Completed: Medical issues which prohibited therapy(Pt. was transferred to the ICU. WIll require a new OT order when pt. is medically appropriate to begin OT services.)  Harrel Carina, MS, OTR/L 12/27/2018, 4:15 PM

## 2018-12-27 NOTE — Progress Notes (Signed)
RN called RT to patient bedside for PRN breathing treatment. Patient experiencing shortness of breath. SAT 93% on 4.5L [place]. Patient has diminished breath sounds with fine crackles. PRN breathing treatment given. Patient resting in bed on 4.5L [place]. Will continue to monitor.

## 2018-12-27 NOTE — Consult Note (Signed)
CRITICAL CARE NOTE      CHIEF COMPLAINT:   Acute hypoxemic respiratory failue   HPI   This is an 85 year old female with a history of CAD, atrial fibrillation, heart failure with preserved ejection fraction most recent transthoracic echo done in April 28 on this admission, notable also for reduced right-sided function with RVSP 67 mmHg, history of CKD with a GFR in the 20s, severe transaminits on this admission unlikely due to shock liver since she was never hypotensive as per EMR, COPD on chronic oxygen 3lmin at home, coronary artery diseasewith hospitalization April 2016 stent placed to marginal branch of the circumflex, Persistent atrial fibrillation, 03/31/2016, AV node ablation, Biventricular ICD implantation, severe anemia, previous epo shot, lung cancer, diabetes mellitus, She presented to [facility] ER via EMS on 7/20 with c/o worsening shortness of breath. DX with acute CHF and COPD exacerbation.Lung ca s/p rads 2015 She has had multiple ICU admission for acute on chronic resp failure.  Patient is being transferred from medical floor while being seen for copd and chf.  She was noted to have decreased O2 saturation in 70s and hospitalist called for critical care consult to manage patient for acute on chronic hypoxemic respiratory failure.  COVID negative.  CODE status DNR  PAST MEDICAL HISTORY   Past Medical History:  Diagnosis Date  . Anemia of chronic disease   . Arthritis   . Asthma   . Cardiac resynchronization therapy pacemaker (CRT-P) in place    a. 03/31/16:  Medtronic Percepta Quad CRT-P MRI SureScan (serial Number [id number]) device.  . Chronic diastolic CHF (congestive heart failure) ([place])    a. 10/2015 Echo: EF 55-65%, Gr1 DD, mild MR, mildly dil LA, nl RV fxn, nl PASP;  b. 03/2016 Echo: EF 30-35%, mod MR,  mod dil LA; c. 12/2016 Echo: EF 60-65%, no rwma, mild AS, mildly dil LA, PASP 64mmHg.  . CKD (chronic kidney disease), stage IV ([place])   . COPD (chronic obstructive pulmonary disease) ([place])   . Coronary artery disease    a. 11/2014 NSTEMI/PCI: LM nl, LAD 45m, D1 30, LCX mild dzs, OM1 20p, OM2 59m, OM3 90p (2.25x8 Promus Premier DES), RCA nl.   . Cough    CHRONIC AT NIGHT  . Cushing's disease ([place])   . Depression   . Diverticulitis   . Edema    FEET/LEGS  . GERD (gastroesophageal reflux disease)   . Gout   . History of hiatal hernia   . History of pneumonia   . HLD (hyperlipidemia)   . HOH (hard of hearing)   . Hypertension   . Hypertensive heart disease   . Lung cancer ([place]) dx'd 2014   S/P radiation 2015  . Migraine   . Mixed Ischemic and Nonischemic Cardiomyopathy ([place])    a. 03/2016 Echo: EF 30-35%;  b. 12/2016 Echo: EF 60-65%.  . Moderate mitral regurgitation   . Multiple allergies   . Myocardial infarction ([place])   . Oxygen deficiency    2LITERS  . Persistent atrial fibrillation    a. CHADS2VASc ==> 7 (CHF, HTN, age x 2, DM, vascular disease, and gender)--was on renal dosed eliquis but this was d/c'd in 12/2016 in setting of dark stools/anemia;  c. 03/2016 s/p AVN and MDT BiV ICD placement.  . Presence of permanent cardiac pacemaker   . S/P AV nodal ablation    a. on 03/31/16 for persistant afib with CRT-P placement  . Sleep apnea   . Type II diabetes mellitus ([place])  SURGICAL HISTORY   Past Surgical History:  Procedure Laterality Date  . ABDOMINAL HYSTERECTOMY    . ABLATION     July 2017  . ADRENALECTOMY Left 1980's   "[place]"  . APPENDECTOMY    . BREAST CYST EXCISION Left   . CATARACT EXTRACTION W/PHACO Right 12/28/2015   Procedure: CATARACT EXTRACTION PHACO AND INTRAOCULAR LENS PLACEMENT (IOC);  Surgeon: Birder Robson, MD;  Location: [facility] ORS;  Service: Ophthalmology;  Laterality: Right;  [place] 48.4   . CATARACT EXTRACTION W/PHACO Left 11/14/2016   Procedure:  CATARACT EXTRACTION PHACO AND INTRAOCULAR LENS PLACEMENT (IOC);  Surgeon: Birder Robson, MD;  Location: [facility] ORS;  Service: Ophthalmology;  Laterality: Left;  [place] 59.8 AP% 18.1 CDE 10.78 Fluid pack lot # [id number]  . CHOLECYSTECTOMY    . CORONARY ANGIOPLASTY WITH STENT PLACEMENT  12/23/2014  . ELECTROPHYSIOLOGIC STUDY N/A 03/08/2016   Procedure: CARDIOVERSION;  Surgeon: Wende Bushy, MD;  Location: [facility] ORS;  Service: Cardiovascular;  Laterality: N/A;  . ELECTROPHYSIOLOGIC STUDY N/A 03/07/2016   Procedure: Cardioversion;  Surgeon: Wende Bushy, MD;  Location: [facility] ORS;  Service: Cardiovascular;  Laterality: N/A;  . ELECTROPHYSIOLOGIC STUDY N/A 03/31/2016   Procedure: AV Node Ablation;  Surgeon: Will Meredith Leeds, MD;  Location: [facility];  Service: Cardiovascular;  Laterality: N/A;  . EP IMPLANTABLE DEVICE N/A 03/31/2016   Procedure: BiV Pacemaker Insertion CRT-P;  Surgeon: Will Meredith Leeds, MD;  Location: [facility];  Service: Cardiovascular;  Laterality: N/A;  . ESOPHAGOGASTRODUODENOSCOPY (EGD) WITH PROPOFOL N/A 01/26/2017   Procedure: ESOPHAGOGASTRODUODENOSCOPY (EGD) WITH PROPOFOL;  Surgeon: [name], MD;  Location: [facility] END[facility];  Service: Endoscopy;  Laterality: N/A;  . FRACTURE SURGERY    . HIP PINNING,CANNULATED Left 02/01/2017   Procedure: CANNULATED HIP PINNING;  Surgeon: Lovell Sheehan, MD;  Location: [facility] ORS;  Service: Orthopedics;  Laterality: Left;  . INSERT / REPLACE / REMOVE PACEMAKER  02/2016  . LEFT HEART CATHETERIZATION WITH CORONARY ANGIOGRAM N/A 12/23/2014   Procedure: LEFT HEART CATHETERIZATION WITH CORONARY ANGIOGRAM;  Surgeon: Burnell Blanks, MD;  Location: [facility] CATH LAB;  Service: Cardiovascular;  Laterality: N/A;  . PERCUTANEOUS CORONARY STENT INTERVENTION (PCI-S)  12/23/2014   Procedure: PERCUTANEOUS CORONARY STENT INTERVENTION (PCI-S);  Surgeon: Burnell Blanks, MD;  Location: [facility] CATH LAB;  Service: Cardiovascular;;  Promus 2.25x8  .  TONSILLECTOMY    . TRANSTHORACIC ECHOCARDIOGRAM  11/26/2015   Technically difficult study. EF 55-60%. Normal wall motion. GR 1 DD.  . TUBAL LIGATION    . WRIST FRACTURE SURGERY Bilateral ~ 2000     FAMILY HISTORY   Family History  Problem Relation Age of Onset  . Heart disease Mother   . Diabetes Mother   . Osteoarthritis Mother   . Hypertension Mother   . Heart disease Father   . Hypertension Father   . COPD Brother      SOCIAL HISTORY   Social History   Tobacco Use  . Smoking status: Former Smoker    Packs/day: 1.00    Years: 45.00    Pack years: 45.00    Types: Cigarettes    Last attempt to quit: 04/25/1994    Years since quitting: 24.6  . Smokeless tobacco: Never Used  Substance Use Topics  . Alcohol use: No    Comment: 12/23/2014 "might have a couple mixed drinks/year"  . Drug use: No     MEDICATIONS   Current Medication:  Current Facility-Administered Medications:  .  0.9 %  sodium chloride infusion,  250 mL, Intravenous, PRN, Seals, Levada Dy H, NP .  acetaminophen (TYLENOL) tablet 650 mg, 650 mg, Oral, Q4H PRN, Seals, Angela H, NP, 650 mg at 12/26/18 0051 .  aspirin EC tablet 81 mg, 81 mg, Oral, Daily, Seals, [place] H, NP, 81 mg at 12/27/18 0941 .  busPIRone (BUSPAR) tablet 7.5 mg, 7.5 mg, Oral, BID, Seals, Angela H, NP, 7.5 mg at 12/27/18 0941 .  feeding supplement (GLUCERNA SHAKE) (GLUCERNA SHAKE) liquid 237 mL, 237 mL, Oral, TID BM, Mody, Sital, MD .  fluticasone furoate-vilanterol (BREO ELLIPTA) 100-25 MCG/INH 1 puff, 1 puff, Inhalation, Daily, Seals, Angela H, NP, 1 puff at 12/27/18 0939 .  furosemide (LASIX) injection 60 mg, 60 mg, Intravenous, BID, End, Christopher, MD, 60 mg at 12/27/18 0939 .  levalbuterol (XOPENEX) nebulizer solution 0.63 mg, 0.63 mg, Nebulization, Q6H, Gollan, Kathlene November, MD .  metoprolol succinate (TOPROL-XL) 24 hr tablet 25 mg, 25 mg, Oral, Daily, Seals, Angela H, NP, 25 mg at 12/27/18 0941 .  multivitamin with minerals tablet 1  tablet, 1 tablet, Oral, Daily, Mody, Sital, MD, 1 tablet at 12/27/18 0941 .  nitroGLYCERIN (NITROSTAT) SL tablet 0.4 mg, 0.4 mg, Sublingual, Q5 Min x 3 PRN, Seals, Angela H, NP .  ondansetron (ZOFRAN) injection 4 mg, 4 mg, Intravenous, Q6H PRN, Seals, Angela H, NP, 4 mg at 12/27/18 0310 .  potassium chloride SA (K-DUR) CR tablet 20 mEq, 20 mEq, Oral, BID, Visser, Jacquelyn D, PA-C, 20 mEq at 12/27/18 0940 .  sertraline (ZOLOFT) tablet 50 mg, 50 mg, Oral, Daily, Seals, Angela H, NP, 50 mg at 12/27/18 0941 .  sodium chloride flush (NS) 0.9 % injection 3 mL, 3 mL, Intravenous, Q12H, Seals, Angela H, NP, 3 mL at 12/27/18 0942 .  sodium chloride flush (NS) 0.9 % injection 3 mL, 3 mL, Intravenous, PRN, Seals, Levada Dy H, NP .  tiotropium (SPIRIVA) inhalation capsule ([facility] use ONLY) 18 mcg, 18 mcg, Inhalation, Daily PRN, Seals, Angela H, NP .  traMADol (ULTRAM) tablet 50 mg, 50 mg, Oral, Q12H PRN, Mansy, Jan A, MD, 50 mg at 12/27/18 0310 .  vitamin B-12 (CYANOCOBALAMIN) tablet 1,000 mcg, 1,000 mcg, Oral, Daily, Seals, Angela H, NP, 1,000 mcg at 12/27/18 0737    ALLERGIES   Ciprofloxacin; Doxycycline; Penicillins; Sulfa antibiotics; Latex; Morphine and related; Albuterol sulfate; Macrobid [nitrofurantoin macrocrystal]; and Cefuroxime    REVIEW OF SYSTEMS    10 Point ROS done and is negative except for sob.   PHYSICAL EXAMINATION   Vitals:   12/27/18 1145 12/27/18 1159  BP:    Pulse:    Resp:    Temp:    SpO2: 94% 94%    GENERAL:chronically ill apreaing HEAD: Normocephalic, atraumatic.  EYES: Pupils equal, round, reactive to light.  No scleral icterus.  MOUTH: Moist mucosal membrane. NECK: Supple. No thyromegaly. No nodules. No JVD.  PULMONARY: bilateral rhonchi CARDIOVASCULAR: S1 and S2. Regular rate and rhythm. No murmurs, rubs, or gallops.  GASTROINTESTINAL: Soft, nontender, non-distended. No masses. Positive bowel sounds. No hepatosplenomegaly.  MUSCULOSKELETAL: No swelling,  clubbing, or edema.  NEUROLOGIC: Mild distress due to acute illness SKIN:intact,warm,dry   LABS AND IMAGING     LAB RESULTS: Recent Labs  Lab 12/25/18 0558 12/26/18 0350 12/27/18 0423  NA 141 141 143  K 3.8 3.5 3.5  CL 89* 88* 89*  CO2 34* 37* 37*  BUN 87* 86* 89*  CREATININE 2.77* 2.22* 2.07*  GLUCOSE 113* 140* 127*   Recent Labs  Lab 12/04/2018 2045 12/25/18 0558 12/26/18  0350  HGB 10.0* 10.0* 9.5*  HCT 34.0* 33.4* 31.9*  WBC 12.9* 5.8 8.3  PLT 167 139* 120*     IMAGING RESULTS: Dg Chest 1 View  Result Date: 12/27/2018 CLINICAL DATA:  Shortness of breath.  Diarrhea. EXAM: CHEST  1 VIEW COMPARISON:  Chest x-rays dated 12/25/2018 and 12/03/2018 10/28/2018 FINDINGS: The patient has developed hazy infiltrates in both mid zones. There is a small left pleural effusion. Chronic cardiomegaly. The pulmonary vascularity appears within normal limits. Pacemaker in place. Aortic atherosclerosis.  Stable scarring in the right lung apex. No acute bone abnormality. IMPRESSION: New hazy bilateral perihilar and midzone infiltrates. This could represent pneumonia or pulmonary edema. Small left pleural effusion. Aortic Atherosclerosis (ICD10-I70.0). Electronically Signed   By: Lorriane Shire M.D.   On: 12/27/2018 12:21   Dg Abd 1 View  Result Date: 12/27/2018 CLINICAL DATA:  Constipation.  Vomiting. EXAM: ABDOMEN - 1 VIEW COMPARISON:  CT scan of the abdomen dated 10/28/2018 and radiograph dated 03/16/2016 FINDINGS: The bowel gas pattern is normal. No evidence of excessive stool in the colon. No fecal impaction. No visible free air or free fluid on the supine radiograph. Cardiomegaly. Pacemaker. Chronic blunting of the left costophrenic angle. No acute bone abnormalities. IMPRESSION: No acute abnormality of the abdomen or pelvis. Electronically Signed   By: Lorriane Shire M.D.   On: 12/27/2018 12:24         ASSESSMENT AND PLAN    -Multidisciplinary rounds held today    ACUTE ON  CHRONIC HYPOXEMIC RESPIRATORY FAILURE           likey multifactorial including :  Acute decompensated diastolic CHF EF >86%           Evidenced by pulmonary edema on cxr as well as bnp >2300 and crackles on auscultation  Severe COPD on chronic O2 at home- sees Dr Mortimer Fries on outpatient - continue copd care path   Hx of Lung CA s/p rads with resultant focal fibrotic areas - noted VQ scan above , no PE -left anterior oblique and right posterior oblique defects likely due to residual scarring - low prob study   Likely pulmonary HTN group 2/3 - as evidenced by RVSP 67 -will need RHC to delineate whether isolated postcapillary or mixed  -plan for NIV with BIPAP -diuresis as tolerated  -COPD carepath with duonebs zithromax and prednisone    NSTEMI    - cardio on case- appreciate input - following recs -oxygen as needed -Lasix as tolerated -follow up cardiac enzymes as indicated ICU monitoring    Renal Failure-CKD      -minimize nephortoxic meds -follow chem 7 -follow UO -continue Foley Catheter-assess need daily    Transaminitis       -Non-cholestatic pattern     -In lieu of NVD on this admit would be prudent to rule out infectious hepatitis -work-up initiated     -Unlikely shock liver as patient was never hypotensive throughout this admission     -Possible metastasis to the liver will obtain AFP and CEA     -Reduction of hepatotoxic medications throughout this admission-have dcd tylenol     Left Upper extermity Swelling     - US -venous study r/o VTE     GI/Nutrition GI PROPHYLAXIS as indicated DIET-->TF's as tolerated Constipation protocol as indicated  ENDO - ICU hypoglycemic\Hyperglycemia protocol -check FSBS per protocol   ELECTROLYTES -follow labs as needed -replace as needed -pharmacy consultation   DVT/GI PRX ordered -SCDs  TRANSFUSIONS AS NEEDED  MONITOR FSBS ASSESS the need for LABS as needed   Critical care provider statement:    Critical care  time (minutes):  109   Critical care time was exclusive of:  Separately billable procedures and treating other patients   Critical care was necessary to treat or prevent imminent or life-threatening deterioration of the following conditions:  Acute hypoxemic resp failure, acute CHF exacerbation, severe COPD, asthma, transaminitis, multiple comorbid conditions    Critical care was time spent personally by me on the following activities:  Development of treatment plan with patient or surrogate, discussions with consultants, evaluation of patient's response to treatment, examination of patient, obtaining history from patient or surrogate, ordering and performing treatments and interventions, ordering and review of laboratory studies and re-evaluation of patient's condition.  I assumed direction of critical care for this patient from another provider in my specialty: no    This document was prepared using Dragon voice recognition software and may include unintentional dictation errors.    Ottie Glazier, M.D.  Division of [place]

## 2018-12-27 NOTE — TOC Progression Note (Signed)
Transition of Care [facility]) - Progression Note    Patient Details  Name: Regina Burke MRN: [id number] Date of Birth: 04/12/1933  Transition of Care [facility]) CM/SW [place], [place] Phone Number: 12/27/2018, 11:58 AM  Clinical Narrative: PT worked with patient and is now recommending SNF. CSW met with patient to discuss discharge plan. Patient states that she does not want to go to SNF. CSW explained that it would be short term and she could benefit from the services provided. After discussing patient reluctantly agreed to bed search. Patient states that she may still choose to go home but is willing to think about going to SNF. Patient also states that she will not go to [facility] because she has been there in the past and was not a good experience. CSW will begin bed search and give bed offers once received. TOC team will continue to follow.       Expected Discharge Plan: [place] Barriers to Discharge: Continued Medical Work up  Expected Discharge Plan and Services Expected Discharge Plan: [place] Choice: [place] arrangements for the past 2 months: Single Family Home Expected Discharge Date: 12/26/18                         HH Arranged: RN, PT, Nurse's Aide(ReDS Vest) [place] Agency: [facility] (now [facility]) Date [place]: 12/25/18 Time [place]: 1436 Representative spoke with at [place]: [place] ([place]) Interventions    Readmission Risk Interventions Readmission Risk Prevention Plan 12/25/2018  Transportation Screening Complete  Medication Review Press photographer) Complete  PCP or Specialist appointment within 3-5 days of discharge Complete  HRI or [place] Complete  SW Recovery Care/Counseling Consult Complete  [place] Not Applicable  Some recent data might  be hidden

## 2018-12-27 NOTE — Progress Notes (Signed)
   12/27/18 1500  Clinical Encounter Type  Visited With Patient;Health care provider  Visit Type Initial  Referral From Physician   OR received to complete or update an AD. Patient recently moved to ICU from telemetry. Upon arrival, she was resting with BiPap in place. She seemed to be focused on breathing while acknowledging my presence. She confirmed interest in a later visit with a head nod. Chaplain will pass this on to the chaplain on-call for follow-up.

## 2018-12-27 NOTE — NC FL2 (Signed)
[place] LEVEL OF CARE SCREENING TOOL     IDENTIFICATION  Patient Name: Regina Burke Birthdate: Sep 21, 1932 Sex: female Admission Date (Current Location): 12/20/2018  [place] and [place] Number:  Engineering geologist and Address:  [facility], [address], [place], [place] [address]      Provider Number: [id number]  Attending Physician Name and Address:  Bettey Costa, MD  Relative Name and Phone Number:       Current Level of Care: Hospital Recommended Level of Care: [place] Prior Approval Number:    Date Approved/Denied:   PASRR Number: [id number]  Discharge Plan: SNF    Current Diagnoses: Patient Active Problem List   Diagnosis Date Noted  . NSTEMI (non-ST elevated myocardial infarction) ([place]) 12/22/2018  . Encephalopathy acute 05/30/2018  . Acute on chronic heart failure ([place])   . Goals of care, counseling/discussion   . Advance care planning   . S/p left hip fracture 01/31/2017  . Pre-operative cardiovascular examination 01/31/2017  . Other diseases of stomach and duodenum   . Dizziness 08/25/2016  . LBBB (left bundle branch block) 04/01/2016  . S/P AV nodal ablation   . Cardiac resynchronization therapy pacemaker (CRT-P) in place   . Persistent atrial fibrillation ([place]): CHA2DS2-Vasc = ~7. On Xarelto 15 mg (age & renal Fxn) 03/18/2016  . Congestive dilated cardiomyopathy ([place]) 03/18/2016    Class: Temporary  . Acute congestive heart failure ([place])   . DNR (do not resuscitate) discussion   . Palliative care encounter   . Persistent atrial fibrillation   . Chronic diastolic CHF (congestive heart failure) ([place]) 02/16/2016  . HLD (hyperlipidemia) 02/16/2016  . Iron deficiency anemia due to chronic blood loss   . Essential hypertension   . Diaphoresis   . Renal insufficiency   . Chronic obstructive pulmonary disease ([place]) 07/14/2015  . Status post thoracentesis   . S/P thoracentesis   . H/O: lung  cancer   . Pleural effusion   . Coronary artery disease involving native coronary artery of native heart without angina pectoris   . S/P coronary artery stent placement   . Pressure ulcer 06/08/2015  . Dehydration 05/09/2015  . Diabetes ([place]) 05/09/2015  . Closed fracture nasal bone 05/09/2015  . Cancer of upper lobe of right lung ([place]) 01/08/2015  . Allergic state 01/08/2015  . Anemia associated with chronic renal failure 01/08/2015  . Chronic kidney disease 01/08/2015  . CAFL (chronic airflow limitation) ([place]) 01/08/2015  . Arthritis, degenerative 01/08/2015  . 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 Diabetes mellitus, type 2 ([place]) 09/11/2014  . Type 2 diabetes mellitus ([place]) 09/11/2014    Orientation RESPIRATION BLADDER Height & Weight     Self, Time, Place  O2 Continent Weight: 145 lb 11.2 oz (66.1 kg) Height:  5\' 6"  (167.6 cm)  BEHAVIORAL SYMPTOMS/MOOD NEUROLOGICAL BOWEL NUTRITION STATUS  (none) (none) Continent Diet(Dysphagia 3 )  AMBULATORY STATUS COMMUNICATION OF NEEDS Skin   Extensive Assist Verbally Normal                       Personal Care Assistance Level of Assistance  Bathing, Feeding, Dressing Bathing Assistance: Limited assistance Feeding assistance: Independent Dressing Assistance: Limited assistance     Functional Limitations Info  Sight, Hearing, Speech Sight Info: Adequate Hearing Info: Adequate Speech Info: Adequate    SPECIAL CARE FACTORS FREQUENCY  PT (By licensed PT), OT (By licensed OT)     PT Frequency: 5 OT Frequency: 5  Contractures Contractures Info: Not present    Additional Factors Info  Code Status, Allergies Code Status Info: DNR Allergies Info:  Ciprofloxacin, Doxycycline, Penicillins, Sulfa Antibiotics, Latex, Morphine And Related, Albuterol Sulfate, Macrobid Nitrofurantoin Macrocrystal, Cefuroxime           Current Medications (12/27/2018):  This is the current hospital active medication  list Current Facility-Administered Medications  Medication Dose Route Frequency Provider Last Rate Last Dose  . 0.9 %  sodium chloride infusion  250 mL Intravenous PRN Seals, Levada Dy H, NP      . acetaminophen (TYLENOL) tablet 650 mg  650 mg Oral Q4H PRN Gardiner Barefoot H, NP   650 mg at 12/26/18 0051  . aspirin EC tablet 81 mg  81 mg Oral Daily Mayer Camel, NP   81 mg at 12/27/18 0941  . busPIRone (BUSPAR) tablet 7.5 mg  7.5 mg Oral BID Gardiner Barefoot H, NP   7.5 mg at 12/27/18 0941  . feeding supplement (GLUCERNA SHAKE) (GLUCERNA SHAKE) liquid 237 mL  237 mL Oral TID BM Mody, Sital, MD      . fluticasone furoate-vilanterol (BREO ELLIPTA) 100-25 MCG/INH 1 puff  1 puff Inhalation Daily Seals, Angela H, NP   1 puff at 12/27/18 0939  . furosemide (LASIX) injection 60 mg  60 mg Intravenous BID End, Christopher, MD   60 mg at 12/27/18 0939  . levalbuterol (XOPENEX) nebulizer solution 0.63 mg  0.63 mg Nebulization Q6H Gollan, Kathlene November, MD      . metoprolol succinate (TOPROL-XL) 24 hr tablet 25 mg  25 mg Oral Daily Seals, Angela H, NP   25 mg at 12/27/18 0941  . multivitamin with minerals tablet 1 tablet  1 tablet Oral Daily Bettey Costa, MD   1 tablet at 12/27/18 0941  . nitroGLYCERIN (NITROSTAT) SL tablet 0.4 mg  0.4 mg Sublingual Q5 Min x 3 PRN Seals, Levada Dy H, NP      . ondansetron (ZOFRAN) injection 4 mg  4 mg Intravenous Q6H PRN Seals, Theo Dills, NP   4 mg at 12/27/18 0310  . potassium chloride SA (K-DUR) CR tablet 20 mEq  20 mEq Oral BID Marrianne Mood D, PA-C   20 mEq at 12/27/18 0940  . sertraline (ZOLOFT) tablet 50 mg  50 mg Oral Daily Seals, Levada Dy H, NP   50 mg at 12/27/18 0941  . sodium chloride flush (NS) 0.9 % injection 3 mL  3 mL Intravenous Q12H Seals, Angela H, NP   3 mL at 12/27/18 0942  . sodium chloride flush (NS) 0.9 % injection 3 mL  3 mL Intravenous PRN Seals, Theo Dills, NP      . tiotropium (SPIRIVA) inhalation capsule ([facility] use ONLY) 18 mcg  18 mcg Inhalation Daily PRN Seals,  Theo Dills, NP      . traMADol (ULTRAM) tablet 50 mg  50 mg Oral Q12H PRN Mansy, Jan A, MD   50 mg at 12/27/18 0310  . vitamin B-12 (CYANOCOBALAMIN) tablet 1,000 mcg  1,000 mcg Oral Daily Seals, Theo Dills, NP   1,000 mcg at 12/27/18 6384     Discharge Medications: Please see discharge summary for a list of discharge medications.  Relevant Imaging Results:  Relevant Lab Results:   Additional Information SSN: [id number]  Annamaria Boots, [place]

## 2018-12-27 NOTE — Progress Notes (Signed)
Family Meeting Note  Advance Directive:no  Today a meeting took place with the Patient. The following clinical team members were present during this meeting:MD  The following were discussed:Patient's diagnosis: chf exacerbation AKI Elevated LFTs  , Patient's progosis: Unable to determine and Goals for treatment: DNR  Additional follow-up to be provided: DNR Chaplain for [facility] /hospice services at discharge as per conversation with patient's son  Time spent during discussion:18 minutes  Bettey Costa, MD

## 2018-12-27 NOTE — Progress Notes (Addendum)
Patient with increased resp distress letahrgic Will tx to step down D/w dr Luvenia Heller  Updated patient's son

## 2018-12-27 NOTE — Progress Notes (Signed)
Progress Note  Patient Name: Regina Burke Date of Encounter: 12/27/2018  Primary Cardiologist: Ida Rogue, MD   Subjective   No complaint of CP today. Feels SOB and does not feel improved from yesterday. Also reports R eye infection, which her son has reportedly been treating per patient. She stated that she does not feel well today but was not able to provide very complete ROS. Still reporting cough today, stating this may be "better or worse than yesterday."  Inpatient Medications    Scheduled Meds: . aspirin EC  81 mg Oral Daily  . busPIRone  7.5 mg Oral BID  . feeding supplement (GLUCERNA SHAKE)  237 mL Oral TID BM  . fluticasone furoate-vilanterol  1 puff Inhalation Daily  . furosemide  60 mg Intravenous BID  . levalbuterol  0.63 mg Nebulization Q6H  . metoprolol succinate  25 mg Oral Daily  . multivitamin with minerals  1 tablet Oral Daily  . potassium chloride  20 mEq Oral BID  . sertraline  50 mg Oral Daily  . sodium chloride flush  3 mL Intravenous Q12H  . vitamin B-12  1,000 mcg Oral Daily   Continuous Infusions: . sodium chloride     PRN Meds: sodium chloride, acetaminophen, nitroGLYCERIN, ondansetron (ZOFRAN) IV, sodium chloride flush, tiotropium, traMADol   Vital Signs    Vitals:   12/26/18 2052 12/27/18 0223 12/27/18 0238 12/27/18 0754  BP: (!) 159/54 (!) 178/60  (!) 162/57  Pulse: 71 70  70  Resp: 18 16  19   Temp: 98.2 F (36.8 C) 97.9 F (36.6 C)  98 F (36.7 C)  TempSrc: Oral Oral  Oral  SpO2: 97% 96% 93% 92%  Weight:  66.1 kg    Height:        Intake/Output Summary (Last 24 hours) at 12/27/2018 6222 Last data filed at 12/27/2018 0300 Gross per 24 hour  Intake 340 ml  Output 1550 ml  Net -1210 ml   Filed Weights   12/25/18 0519 12/26/18 0530 12/27/18 0223  Weight: 81.1 kg 66.2 kg 66.1 kg    Telemetry    Not on telemetry screen but in room telemetry continues to show afib with ventricular paced rhythm- Personally Reviewed  ECG   No new tracings - Personally Reviewed  Physical Exam   GEN: Frail elderly woman Neck: HJR not assessed as patient TTP in abdominal area Cardiac: RRR, 2/6 systolic murmur clearest in RUSB, no rubs, gallops.  Respiratory: Bibasilar diminished breath sounds.  GI: Soft,  Ttp, non-distended  MS: Mild bilateral lower extremity edema; No deformity. Neuro:  Nonfocal  Psych: Normal affect   Labs    Chemistry Recent Labs  Lab 12/25/18 0558 12/26/18 0350 12/27/18 0423  NA 141 141 143  K 3.8 3.5 3.5  CL 89* 88* 89*  CO2 34* 37* 37*  GLUCOSE 113* 140* 127*  BUN 87* 86* 89*  CREATININE 2.77* 2.22* 2.07*  CALCIUM 8.1* 8.0* 8.8*  PROT 6.4* 6.1* 6.6  ALBUMIN 3.0* 2.9* 3.1*  AST 963* 469* 173*  ALT 571* 450* 316*  ALKPHOS 69 63 65  BILITOT 2.8* 2.3* 3.3*  GFRNONAA 15* 20* 22*  GFRAA 18* 23* 25*  ANIONGAP 18* 16* 17*     Hematology Recent Labs  Lab 12/03/2018 2045 12/25/18 0558 12/26/18 0350  WBC 12.9* 5.8 8.3  RBC 3.99 4.04 3.84*  HGB 10.0* 10.0* 9.5*  HCT 34.0* 33.4* 31.9*  MCV 85.2 82.7 83.1  MCH 25.1* 24.8* 24.7*  MCHC 29.4* 29.9*  29.8*  RDW 18.5* 18.8* 18.9*  PLT 167 139* 120*    Cardiac Enzymes Recent Labs  Lab 12/10/2018 2045 12/24/18 0229 12/24/18 0721  TROPONINI 5.35* 5.56* 5.02*   No results for input(s): TROPIPOC in the last 168 hours.   BNP Recent Labs  Lab 12/04/2018 2049  BNP 2,346.0*     DDimer No results for input(s): DDIMER in the last 168 hours.   Radiology    Nm Pulmonary Perfusion  Result Date: 12/25/2018 CLINICAL DATA:  Shortness of breath EXAM: NUCLEAR MEDICINE  PERFUSION SCAN VIEWS: Anterior, posterior, left lateral, right lateral, RPO, LPO, RAO, LAO RADIOPHARMACEUTICALS:  4.31 mCi Tc55m MAA-IV COMPARISON:  Chest radiograph December 25, 2018 FINDINGS: Perfusion: There is cardiomegaly. Mild photopenia in the left base region corresponds to an area of consolidation on the chest radiograph. Elsewhere there are a few scattered subcentimeter areas  of decreased radiotracer uptake which are felt to be consistent with areas of mild scarring. Photopenia in the right apex corresponds to an area of apparent consolidation/potential mass. No segmental level perfusion defects without chest radiographic abnormality noted. IMPRESSION: There are areas of decreased attenuation in the right apex and left base regions which corresponded chest radiographic abnormalities. There is no segmental or significant subsegmental level perfusion defect without corresponding chest radiographic abnormality. This study is overall consistent with a relatively low probability of pulmonary embolus. Electronically Signed   By: Lowella Grip III M.D.   On: 12/25/2018 14:53   Dg Chest Port 1 View  Result Date: 12/25/2018 CLINICAL DATA:  Shortness of breath, history of lung cancer EXAM: PORTABLE CHEST 1 VIEW COMPARISON:  12/15/2018 FINDINGS: Bilateral mild interstitial thickening. Small bilateral pleural effusions. No focal consolidation or pneumothorax. Stable right upper lobe opacity consistent with area of known treated malignancy. Stable cardiomegaly. Dual lead cardiac pacemaker. Thoracic aortic atherosclerosis. No acute osseous abnormality. IMPRESSION: 1. Cardiomegaly with mild pulmonary vascular congestion. 2. Stable right upper lobe airspace opacity consistent with known treated malignancy. Electronically Signed   By: Kathreen Devoid   On: 12/25/2018 14:11    Cardiac Studies   2D Echo 12/24/2018: 1. The left ventricle has normal systolic function with an ejection fraction of 60-65%. The cavity size was normal. There is mildly increased left ventricular wall thickness. Left ventricular diastolic function could not be evaluated secondary to atrialfibrillation. There is right ventricular volume and pressure overload. 2. The right ventricle has moderately reduced systolic function. The cavity was moderately enlarged. There is moderately increased right ventricular wall  thickness. Right ventricular systolic pressure is moderately to severely elevated with an estimatedpressure of 67 mmHg. 3. Left atrial size was severely dilated. 4. Right atrial size was moderately dilated. 5. Mildly thickened tricuspid valve leaflets. 6. The mitral valve is degenerative. Mild thickening of the mitral valve leaflet. There is moderate mitral annular calcification present. Mitral valve regurgitation is mild to moderate by color flow Doppler. 7. The tricuspid valve is abnormal. Tricuspid valve regurgitation is moderate-severe. 8. The aortic valve is tricuspid. Moderate thickening of the aortic valve. Mild calcification of the aortic valve. Aortic valve regurgitation is trivial by color flow Doppler. Mild stenosis of the aortic valve. 9. The inferior vena cava was dilated in size with <50% respiratory variability. 10. The interatrial septum appears to be lipomatous. 11. Pericardial thickening is suspected. __________  Device interrogation 12/24/2018: Device: Medtronic Percepta Quad CRT-P Mode: VVIR Lower rate: 70 bpm Upper sensor: 110 bpm VT monitor: 150 bpm  Impedence: RV 494, LV 494 Capture threshold: RV  0.625 V @ 0.40 ms Pulse amplitude/width: RV 2.5 V/0.40 ms, LV 2.5 V/1.5 ms Sensitivity: RV 4.00 mV  Events: None.  VP: 100%  OptiVol Alert: Fluid accumulation ongoing sinice 12/16/2018.  No programming changes made.  Patient Profile     85 y.o. female with a h/o CAD s/p PCI to [place] (2016) in setting of NSTEMI, chronic systolic and diastolic HF, permanent Afib s/p AV node ablation and BiV PPM, CKDIV, chronic respiratory failure with hypoxia and hypercapnia 2/2 COPD, HTN, HLD, DM2, chronic anemia with prior GIB and who is being seen for SOB with elevated troponin.   Assessment & Plan    Acute on chronic HFpEF/ right heart failure - C/o SOB, orthopnea, cough. Right heart failure with evidence of liver injury with perfusion scan showing low probability for  PE. - Renal function continues to improve. 2.22  2.07. Negative 1.2L net output recorded yesterday and -4.3 output since admission. Continue IV diuresis with close monitoring of renal function. Continue Toprol. Continue to monitor I/O, daily weights. Continue to monitor with daily BMET. Consider pulmonary infection given continued productive cough.   HTN - BP continues to not be well controlled this admission. If BP elevated following this morning's BB, could consider increasing BB as paced rate or adding CCB.   Hypokalemia - Potassium 3.5. Continue repleting with goal 4.0.   NSTEMI with history of CAD involving the native coronary arteries  - No CP currently or during the admission. Troponin elevation flat trending and with peak of 5.56- now downtrending.  - Elevation thought more consistent with supply demand mismatch in setting of HFpEF, CKD, and acute illness with multiorgan dysfunction versus acute plaque rupture MI. Cannot completely rule out cardiac etiology with history of 2016 PCI; therefore, continue ASA as below. Echo as above showed normal LVEF and no significant WMA. Low probability for PE per perfusion imaging. Heparin discontinued after 48h.  - No plan for further ischemic workup including cardiac cath at this time given comorbid conditions. Poor candidate for cardiac catheterization.  - Continue medical therapy with ASA, Toprol. Continue holding lipitor and fenofibrate given acute liver injury.   Permanent Afib - Ventricular pacing. No significant arrhythmia per interrogation of device.  - Not an ideal candidate for long term [place] given prior GIB and h/o medication noncompliance. Continue Toprol XL with current rates controlled.  Acute liver failure and abdominal pain - Evidence of transaminitis, elevated bilirubin, INR. Consider etiologies of congestive right HF, hepatic infarcts in setting of permanent Afib not on anticoagulation, viral hepatitis, medication induced, transient  hypotension. Consider abdominal pain d/t postherpetic neuralgia. - Continue to hold Lipitor and fenofibrate as above. Avoid additional hepatotoxic agents. Close monitoring of liver function. Consider GI consult. Per IM.  Acute on CKDIII-IV - Close to baseline Cr 2-2.5. Improving with diuresis.  Avoid nephrotoxins. Spironolactone discontinued earlier in admission d/t renal function and would continue to hold given hepatic impairment as directly above.   Anemia - Daily CBC. Monitor as on ASA. Likely anemia of chronic disease.  SOB/cough - Etiology unclear at this time with h/o COPD and HFpEF. COVID-19 testing negative. Further workup and consideration of abx per IM.    For questions or updates, please contact [place] Please consult [email or web address] for contact info under        Signed, Arvil Chaco, PA-C  12/27/2018, 8:23 AM

## 2018-12-27 NOTE — Progress Notes (Signed)
Pt noted to be increasingly lethargic and hypoxic with oxygen sats in the 70's, MD notified, patient to be transferred to ICU/stepdown unit. Son [name] notified and updated at this time.

## 2018-12-27 NOTE — Progress Notes (Signed)
[facility] [facility] [place] NAME: Regina Burke    MR#:  [id number]  DATE OF BIRTH:  03/12/1933  SUBJECTIVE:   feels weak SOB still but better then yesterday   REVIEW OF SYSTEMS:    Review of Systems  Constitutional: Negative for fever, chills weight loss HENT: Negative for ear pain, nosebleeds, congestion, facial swelling, rhinorrhea, neck pain, neck stiffness and ear discharge.   Respiratory: Negative for cough, ++ shortness of breath, no wheezing  Cardiovascular: Negative for chest pain, palpitations and NO leg swelling.  Gastrointestinal: Negative for heartburn, abdominal pain, vomiting, diarrhea or consitpation Genitourinary: Negative for dysuria, urgency, frequency, hematuria Musculoskeletal: Negative for back pain or joint pain Neurological: Negative for dizziness, seizures, syncope, focal weakness,  numbness and headaches.  Hematological: Does not bruise/bleed easily.  Psychiatric/Behavioral: Negative for hallucinations, confusion, dysphoric mood    Tolerating Diet:yes      DRUG ALLERGIES:   Allergies  Allergen Reactions  . Ciprofloxacin Shortness Of Breath, Itching and Rash  . Doxycycline Shortness Of Breath, Itching and Rash  . Penicillins Shortness Of Breath, Itching, Rash and Other (See Comments)    Has patient had a PCN reaction causing immediate rash, facial/tongue/throat swelling, SOB or lightheadedness with hypotension: Yes Has patient had a PCN reaction causing severe rash involving mucus membranes or skin necrosis: No Has patient had a PCN reaction that required hospitalization No Has patient had a PCN reaction occurring within the last 10 years: No If all of the above answers are "NO", then may proceed with Cephalosporin use.  . Sulfa Antibiotics Shortness Of Breath, Itching and Rash  . Latex Itching  . Morphine And Related Itching  . Albuterol Sulfate   . Macrobid [Nitrofurantoin Macrocrystal] Itching  .  Cefuroxime Rash    Blisters in mouth    VITALS:  Blood pressure (!) 162/57, pulse 70, temperature 98 F (36.7 C), temperature source Oral, resp. rate 19, height 5\' 6"  (1.676 m), weight 66.1 kg, SpO2 92 %.  PHYSICAL EXAMINATION:  Constitutional: Appears well-developed and well-nourished. No distress. HENT: Normocephalic. Marland Kitchen Oropharynx is clear and moist.  Eyes: Conjunctivae and EOM are normal. PERRLA, no scleral icterus.  Neck: Normal ROM. Neck supple. + JVD. No tracheal deviation. CVS: irr, irr, S1/S2 +, 2/6 murmurs, no gallops, no carotid bruit.  Pulmonary: b/l crackles at bases/rhonchii b/l Abdominal: Soft. BS +,  no distension, tenderness, rebound or guarding.  Musculoskeletal: Normal range of motion. No edema and no tenderness.  Neuro: Alert. CN 2-12 grossly intact. No focal deficits. Skin: Skin is warm and dry. No rash noted. Psychiatric: flat affect depressed?    LABORATORY PANEL:   CBC Recent Labs  Lab 12/26/18 0350  WBC 8.3  HGB 9.5*  HCT 31.9*  PLT 120*   ------------------------------------------------------------------------------------------------------------------  Chemistries  Recent Labs  Lab 12/26/18 0350 12/27/18 0423  NA 141 143  K 3.5 3.5  CL 88* 89*  CO2 37* 37*  GLUCOSE 140* 127*  BUN 86* 89*  CREATININE 2.22* 2.07*  CALCIUM 8.0* 8.8*  MG 1.9  --   AST 469* 173*  ALT 450* 316*  ALKPHOS 63 65  BILITOT 2.3* 3.3*   ------------------------------------------------------------------------------------------------------------------  Cardiac Enzymes Recent Labs  Lab 12/07/2018 2045 12/24/18 0229 12/24/18 0721  TROPONINI 5.35* 5.56* 5.02*   ------------------------------------------------------------------------------------------------------------------  RADIOLOGY:  Nm Pulmonary Perfusion  Result Date: 12/25/2018 CLINICAL DATA:  Shortness of breath EXAM: NUCLEAR MEDICINE  PERFUSION SCAN VIEWS: Anterior, posterior, left lateral, right  lateral, RPO, LPO,  RAO, LAO RADIOPHARMACEUTICALS:  4.31 mCi Tc87m MAA-IV COMPARISON:  Chest radiograph December 25, 2018 FINDINGS: Perfusion: There is cardiomegaly. Mild photopenia in the left base region corresponds to an area of consolidation on the chest radiograph. Elsewhere there are a few scattered subcentimeter areas of decreased radiotracer uptake which are felt to be consistent with areas of mild scarring. Photopenia in the right apex corresponds to an area of apparent consolidation/potential mass. No segmental level perfusion defects without chest radiographic abnormality noted. IMPRESSION: There are areas of decreased attenuation in the right apex and left base regions which corresponded chest radiographic abnormalities. There is no segmental or significant subsegmental level perfusion defect without corresponding chest radiographic abnormality. This study is overall consistent with a relatively low probability of pulmonary embolus. Electronically Signed   By: Lowella Grip III M.D.   On: 12/25/2018 14:53   Dg Chest Port 1 View  Result Date: 12/25/2018 CLINICAL DATA:  Shortness of breath, history of lung cancer EXAM: PORTABLE CHEST 1 VIEW COMPARISON:  12/19/2018 FINDINGS: Bilateral mild interstitial thickening. Small bilateral pleural effusions. No focal consolidation or pneumothorax. Stable right upper lobe opacity consistent with area of known treated malignancy. Stable cardiomegaly. Dual lead cardiac pacemaker. Thoracic aortic atherosclerosis. No acute osseous abnormality. IMPRESSION: 1. Cardiomegaly with mild pulmonary vascular congestion. 2. Stable right upper lobe airspace opacity consistent with known treated malignancy. Electronically Signed   By: Kathreen Devoid   On: 12/25/2018 14:11     ASSESSMENT AND PLAN:   85 year old female with a history of CAD status post PCI, chronic combined systolic and diastolic heart failure, PAF status post AV node ablation and chronic kidney disease stage  IV who presented to emergency room due to shortness of breath.  1.  Acute on chronic combined systolic and diastolic heart failure: Continue Lasix 60 mg IV twice daily (creatinine seems to be responding well to IV diuresis) Monitor I/O Appreciate cardiology consult ECHO shows shows normal LVEF with dilated and hypokinetic RV.  There is mild AS, mild to moderate MR, and moderate to severe TR.  Moderate to severe PH also noted. Continue Toprol  2. Elevated LFts in the setting of hepatic consgestion, possible early cirrhosis as per ABD ultrasound. LFTs have improved.  Continue to hold statin and fenofibrate.      3. Elevated troponin not c/w NSTEMI/ACS as per Cardiology. Troponin elevation in the setting of demand ischemia with CHF and CKD. Paren has been discontinued   troponin max 5.56 and has trended down. Continue aspirin.  Statin and fenofibrate on hold due to the setting of elevated LFTs. Cardiology is not recommending cardiac catheterization at this time giving multiple comorbidities and possible worsening of renal function in the setting of chronic kidney disease stage IV. Device interrogation did not reveal ventricular tachyarrhythmia.  4.  Permanent atrial fibrillation status post AV node ablation with biventricular ICD implantation: She is currently in normal sinus rhythm she has been off of anticoagulation due to prior GI bleed.  Continue Toprol-XL No significant arrhythmia per interrogation of device during this hospital stay.  5.  Shortness of breath: COVID-19 testing was negative.   VQ scan low probability for PE.  Shortness of breath is due to CHF exacerbation.  PT recommending skilled nursing facility. Palliative care consult to discuss goals of care as well as outpatient palliative care services versus hospice once patient is discharged.  Case discussed with cardiology this morning.   Management plans discussed with the patient and she is in agreement.  CODE  STATUS: full  TOTAL TIME TAKING CARE OF THIS PATIENT: 25 minutes.   Discussed with son yesterday and will call son then later today.  POSSIBLE D/C 2-3 days, DEPENDING ON CLINICAL CONDITION.   Bettey Costa M.D on 12/27/2018 at 11:14 AM  Between 7am to 6pm - Pager - [phone] After 6pm go to [email or web address] - password [email or web address] [facility]  Office  [phone]  CC: Primary care physician; Tracie Harrier, MD  Note: This dictation was prepared with Dragon dictation along with smaller phrase technology. Any transcriptional errors that result from this process are unintentional.

## 2018-12-27 NOTE — Consult Note (Signed)
Consultation Note Date: 12/27/2018   Patient Name: Regina Burke  DOB: 07/02/1933  MRN: [id number]  Age / Sex: 85 y.o., female  PCP: Tracie Harrier, MD Referring Physician: Bettey Costa, MD  Reason for Consultation: Establishing goals of care  HPI/Patient Profile: 85 y.o. female  with past medical history of CAD status post PCI, chronic combined systolic and diastolic heart failure, PAF status post AV node ablation and chronic kidney disease stage IV,   Seen by PMT multiple times, last 5/19 admitted on 12/18/2018 with acute on chronic hypoxemic respiratory failure.   Clinical Assessment and Goals of Care: Mrs. Buckbee is sitting up in bed.  She will make an somewhat keep eye contact and appears acutely/chronically ill and frail.  She does have some work of breathing noted.  There is no family at bedside at this time due to visitor restrictions.  Mrs. Steller and I talked about her acute health problems.  The first thing she says to me is, "I cannot breathe".  Mrs. Attridge tells me that she will be moved to intensive care and placed on BiPAP.  We talked about the benefits of BiPAP for her heart failure, helping to push fluid out of her lungs.  I ask if she is on BiPAP for several days and is not better, how should we care for her?  She does not answer me.  Ask again if she is not improving after several days of BiPAP how she would care for her.  At this point she states, "send me wherever".  Call to son, Jory Ee at [phone].  He tells me that he has spoken to the doctors and nurses at the hospital and understands that his mother has been transferred to intensive care.  We talked about her acute and chronic health problems.  Blanch Media states that his mother uses BiPAP most every time she is at the hospital.  He shares this has been mostly overnight, but at times during the day.  We talked about her acute  respiratory distress, and I encouraged him to consider how long we would use BiPAP, if we were not seeing improvement after several days.   We discussed calling nursing staff for E link video visit.  Blanch Media shares that he hopes his mother "has not given up", but he also endorses that she has been through a lot.     We talked about healthcare power of attorney, see below.  Conference with case management and cardiology RN related to patient condition/needs.  HCPOA   NEXT OF KIN -son Jory Ee.  Ms. Lindsley tells me that she really has lived in her home since approximately 2011.  Mrs. Gabrielsen tells me that she has 2 daughters and 2 sons.   SUMMARY OF RECOMMENDATIONS   At this point continue to treat the treatable but no CPR, no intubation. She is agreeable to the use of BiPAP, but cannot tell me for how long.   Code Status/Advance Care Planning:  DNR  Symptom Management:   Per  hospitalist, no additional needs at this time.   Palliative Prophylaxis:   Oral Care and Turn Reposition  Additional Recommendations (Limitations, Scope, Preferences):  Treat the treatable but no CPR, no intubation.  Agreeable to the use of BiPAP, but unable to set a limit for how long  Psycho-social/Spiritual:   Desire for further Chaplaincy support:no  Additional Recommendations: Caregiving  Support/Resources and Education on Hospice  Prognosis:   Unable to determine, based on outcomes.  Guarded at this point.  Discharge Planning: To be determined, based on outcomes.      Primary Diagnoses: Present on Admission: . NSTEMI (non-ST elevated myocardial infarction) ([place])   I have reviewed the medical record, interviewed the patient and family, and examined the patient. The following aspects are pertinent.  Past Medical History:  Diagnosis Date  . Anemia of chronic disease   . Arthritis   . Asthma   . Cardiac resynchronization therapy pacemaker (CRT-P) in place    a. 03/31/16:  Medtronic  Percepta Quad CRT-P MRI SureScan (serial Number [id number]) device.  . Chronic diastolic CHF (congestive heart failure) ([place])    a. 10/2015 Echo: EF 55-65%, Gr1 DD, mild MR, mildly dil LA, nl RV fxn, nl PASP;  b. 03/2016 Echo: EF 30-35%, mod MR, mod dil LA; c. 12/2016 Echo: EF 60-65%, no rwma, mild AS, mildly dil LA, PASP 17mmHg.  . CKD (chronic kidney disease), stage IV ([place])   . COPD (chronic obstructive pulmonary disease) ([place])   . Coronary artery disease    a. 11/2014 NSTEMI/PCI: LM nl, LAD 23m, D1 30, LCX mild dzs, OM1 20p, OM2 82m, OM3 90p (2.25x8 Promus Premier DES), RCA nl.   . Cough    CHRONIC AT NIGHT  . Cushing's disease ([place])   . Depression   . Diverticulitis   . Edema    FEET/LEGS  . GERD (gastroesophageal reflux disease)   . Gout   . History of hiatal hernia   . History of pneumonia   . HLD (hyperlipidemia)   . HOH (hard of hearing)   . Hypertension   . Hypertensive heart disease   . Lung cancer ([place]) dx'd 2014   S/P radiation 2015  . Migraine   . Mixed Ischemic and Nonischemic Cardiomyopathy ([place])    a. 03/2016 Echo: EF 30-35%;  b. 12/2016 Echo: EF 60-65%.  . Moderate mitral regurgitation   . Multiple allergies   . Myocardial infarction ([place])   . Oxygen deficiency    2LITERS  . Persistent atrial fibrillation    a. CHADS2VASc ==> 7 (CHF, HTN, age x 2, DM, vascular disease, and gender)--was on renal dosed eliquis but this was d/c'd in 12/2016 in setting of dark stools/anemia;  c. 03/2016 s/p AVN and MDT BiV ICD placement.  . Presence of permanent cardiac pacemaker   . S/P AV nodal ablation    a. on 03/31/16 for persistant afib with CRT-P placement  . Sleep apnea   . Type II diabetes mellitus ([facility])     Family History  Problem Relation Age of Onset  . Heart disease Mother   . Diabetes Mother   . Osteoarthritis Mother   . Hypertension Mother   . Heart disease Father   . Hypertension Father   . COPD Brother     Allergies  Allergen Reactions  . Ciprofloxacin  Shortness Of Breath, Itching and Rash  . Doxycycline Shortness Of Breath, Itching and Rash  . Penicillins Shortness Of Breath, Itching, Rash and Other (See Comments)  Has patient had a PCN reaction causing immediate rash, facial/tongue/throat swelling, SOB or lightheadedness with hypotension: Yes Has patient had a PCN reaction causing severe rash involving mucus membranes or skin necrosis: No Has patient had a PCN reaction that required hospitalization No Has patient had a PCN reaction occurring within the last 10 years: No If all of the above answers are "NO", then may proceed with Cephalosporin use.  . Sulfa Antibiotics Shortness Of Breath, Itching and Rash  . Latex Itching  . Morphine And Related Itching  . Albuterol Sulfate   . Macrobid [Nitrofurantoin Macrocrystal] Itching  . Cefuroxime Rash    Blisters in mouth   Review of Systems  Unable to perform ROS: Age    Physical Exam Vitals signs and nursing note reviewed.  Constitutional:      Comments: Makes and will briefly keep eye contact, chronically ill and frail  Cardiovascular:     Rate and Rhythm: Normal rate.  Pulmonary:     Effort: Respiratory distress present.     Comments: Work of breathing noted Musculoskeletal:     Right lower leg: Edema present.     Comments: Left upper extremity with anasarca  Skin:    General: Skin is warm and dry.     Comments: Multiple areas of bruising, bilateral feet bluish tint  Neurological:     General: No focal deficit present.     Mental Status: She is oriented to person, place, and time.  Psychiatric:     Comments: Flat affect, but cooperative     Vital Signs: BP (!) 162/57 (BP Location: Right Arm)   Pulse 70   Temp 98 F (36.7 C) (Oral)   Resp 19   Ht 5\' 6"  (1.676 m)   Wt 66.1 kg   SpO2 92%   BMI 23.52 kg/m  Pain Scale: 0-10   Pain Score: 0-No pain   SpO2: SpO2: 92 % O2 Device:SpO2: 92 % O2 Flow Rate: .O2 Flow Rate (L/min): 5 L/min  IO: Intake/output summary:    Intake/Output Summary (Last 24 hours) at 12/27/2018 0951 Last data filed at 12/27/2018 0300 Gross per 24 hour  Intake 340 ml  Output 1550 ml  Net -1210 ml    LBM: Last BM Date: 01/23/19 Baseline Weight: Weight: 80 kg Most recent weight: Weight: 66.1 kg     Palliative Assessment/Data:   Flowsheet Rows     Most Recent Value  Intake Tab  Referral Department  Hospitalist  Unit at Time of Referral  Med/Surg Unit  Palliative Care Primary Diagnosis  Cardiac  Date Notified  12/27/18  Palliative Care Type  Return patient Palliative Care  Reason for referral  Clarify Goals of Care  Date of Admission  12/14/2018  Date first seen by Palliative Care  12/27/18  # of days Palliative referral response time  0 Day(s)  # of days IP prior to Palliative referral  4  Clinical Assessment  Palliative Performance Scale Score  40%  Pain Max last 24 hours  Not able to report  Pain Min Last 24 hours  Not able to report  Dyspnea Max Last 24 Hours  Not able to report  Dyspnea Min Last 24 hours  Not able to report  Psychosocial & Spiritual Assessment  Palliative Care Outcomes      Time In: 1100 Time Out: 1220 Time Total: 70 minutes Greater than 50%  of this time was spent counseling and coordinating care related to the above assessment and plan.  Signed by: Aniceto Boss  Caryl Ada, NP   Please contact Palliative Medicine Team phone at [phone] for questions and concerns.  For individual provider: See Shea Evans

## 2018-12-27 DEATH — deceased

## 2018-12-28 DIAGNOSIS — R6520 Severe sepsis without septic shock: Secondary | ICD-10-CM

## 2018-12-28 DIAGNOSIS — O85 Puerperal sepsis: Secondary | ICD-10-CM

## 2018-12-28 DIAGNOSIS — J9601 Acute respiratory failure with hypoxia: Secondary | ICD-10-CM

## 2018-12-28 LAB — COMPREHENSIVE METABOLIC PANEL
ALT: 225 U/L — ABNORMAL HIGH (ref 0–44)
AST: 77 U/L — ABNORMAL HIGH (ref 15–41)
Albumin: 3.2 g/dL — ABNORMAL LOW (ref 3.5–5.0)
Alkaline Phosphatase: 68 U/L (ref 38–126)
Anion gap: 19 — ABNORMAL HIGH (ref 5–15)
BUN: 95 mg/dL — ABNORMAL HIGH (ref 8–23)
CO2: 38 mmol/L — ABNORMAL HIGH (ref 22–32)
Calcium: 8.8 mg/dL — ABNORMAL LOW (ref 8.9–10.3)
Chloride: 89 mmol/L — ABNORMAL LOW (ref 98–111)
Creatinine, Ser: 2.06 mg/dL — ABNORMAL HIGH (ref 0.44–1.00)
GFR calc Af Amer: 25 mL/min — ABNORMAL LOW (ref >60)
GFR calc non Af Amer: 22 mL/min — ABNORMAL LOW (ref >60)
Glucose, Bld: 146 mg/dL — ABNORMAL HIGH (ref 70–99)
Potassium: 4 mmol/L (ref 3.5–5.1)
Sodium: 146 mmol/L — ABNORMAL HIGH (ref 135–145)
Total Bilirubin: 2.7 mg/dL — ABNORMAL HIGH (ref 0.3–1.2)
Total Protein: 6.8 g/dL (ref 6.5–8.1)

## 2018-12-28 LAB — HEPATITIS C ANTIBODY: HCV Ab: <0.1 s/co ratio (ref 0.0–0.9)

## 2018-12-28 LAB — HEPATITIS B E ANTIGEN: Hep B E Ag: NEGATIVE

## 2018-12-28 LAB — CEA: CEA: 3.3 ng/mL (ref 0.0–4.7)

## 2018-12-28 LAB — MRSA PCR SCREENING: MRSA by PCR: NEGATIVE

## 2018-12-28 LAB — HEPATITIS A ANTIBODY, IGM: Hep A IgM: NEGATIVE

## 2018-12-28 LAB — MAGNESIUM: Magnesium: 1.9 mg/dL (ref 1.7–2.4)

## 2018-12-28 LAB — PHOSPHORUS: Phosphorus: 5.6 mg/dL — ABNORMAL HIGH (ref 2.5–4.6)

## 2018-12-28 LAB — HEPATITIS B CORE ANTIBODY, TOTAL: Hep B Core Total Ab: NEGATIVE

## 2018-12-28 LAB — HEPATITIS B CORE ANTIBODY, IGM: Hep B C IgM: NEGATIVE

## 2018-12-28 LAB — AFP TUMOR MARKER: AFP, Serum, Tumor Marker: 1.2 ng/mL (ref 0.0–8.3)

## 2018-12-28 MED ORDER — FUROSEMIDE 10 MG/ML IJ SOLN
40.0000 mg | Freq: Two times a day (BID) | INTRAMUSCULAR | Status: DC
  Administered 2018-12-28 – 2018-12-29 (×3): 40 mg via INTRAVENOUS
  Filled 2018-12-28 (×3): qty 4

## 2018-12-28 MED ORDER — ALBUMIN HUMAN 25 % IV SOLN
12.5000 g | Freq: Every day | INTRAVENOUS | Status: DC
  Administered 2018-12-28 – 2018-12-29 (×2): 12.5 g via INTRAVENOUS
  Filled 2018-12-28: qty 50
  Filled 2018-12-28: qty 100
  Filled 2018-12-28 (×3): qty 50

## 2018-12-28 MED ORDER — PREDNISONE 10 MG PO TABS
50.0000 mg | ORAL_TABLET | Freq: Every day | ORAL | Status: DC
  Administered 2018-12-28: 50 mg via ORAL
  Filled 2018-12-28: qty 1

## 2018-12-28 MED ORDER — TORSEMIDE 20 MG PO TABS: 40.0000 mg | ORAL_TABLET | Freq: Every day | ORAL | Status: DC

## 2018-12-28 NOTE — Progress Notes (Signed)
Patient was able to come off of BiPAP to [place] for ~ 35 minutes. Before returning to BiPAP, the patient became hypoxic with an SPO2 of 81% with an increased WOB. The patient also vomited once while on [place]. Patient returned to BiPAP with previous settings @ 1220 hrs.

## 2018-12-28 NOTE — Progress Notes (Signed)
CRITICAL CARE NOTE        SUBJECTIVE FINDINGS & SIGNIFICANT EVENTS   Patient remains critically ill, she had episode of vomiting this am, unable to use BIPAP. S/P cardio evaluation this am - discussed care plan - appreciate recs.    PAST MEDICAL HISTORY   Past Medical History:  Diagnosis Date  . Anemia of chronic disease   . Arthritis   . Asthma   . Cardiac resynchronization therapy pacemaker (CRT-P) in place    a. 03/31/16:  Medtronic Percepta Quad CRT-P MRI SureScan (serial Number [id number]) device.  . Chronic diastolic CHF (congestive heart failure) ([place])    a. 10/2015 Echo: EF 55-65%, Gr1 DD, mild MR, mildly dil LA, nl RV fxn, nl PASP;  b. 03/2016 Echo: EF 30-35%, mod MR, mod dil LA; c. 12/2016 Echo: EF 60-65%, no rwma, mild AS, mildly dil LA, PASP 31mmHg.  . CKD (chronic kidney disease), stage IV ([place])   . COPD (chronic obstructive pulmonary disease) ([place])   . Coronary artery disease    a. 11/2014 NSTEMI/PCI: LM nl, LAD 77m, D1 30, LCX mild dzs, OM1 20p, OM2 88m, OM3 90p (2.25x8 Promus Premier DES), RCA nl.   . Cough    CHRONIC AT NIGHT  . Cushing's disease ([place])   . Depression   . Diverticulitis   . Edema    FEET/LEGS  . GERD (gastroesophageal reflux disease)   . Gout   . History of hiatal hernia   . History of pneumonia   . HLD (hyperlipidemia)   . HOH (hard of hearing)   . Hypertension   . Hypertensive heart disease   . Lung cancer ([place]) dx'd 2014   S/P radiation 2015  . Migraine   . Mixed Ischemic and Nonischemic Cardiomyopathy ([place])    a. 03/2016 Echo: EF 30-35%;  b. 12/2016 Echo: EF 60-65%.  . Moderate mitral regurgitation   . Multiple allergies   . Myocardial infarction ([place])   . Oxygen deficiency    2LITERS  . Persistent atrial fibrillation    a. CHADS2VASc ==> 7 (CHF, HTN, age x 2,  DM, vascular disease, and gender)--was on renal dosed eliquis but this was d/c'd in 12/2016 in setting of dark stools/anemia;  c. 03/2016 s/p AVN and MDT BiV ICD placement.  . Presence of permanent cardiac pacemaker   . S/P AV nodal ablation    a. on 03/31/16 for persistant afib with CRT-P placement  . Sleep apnea   . Type II diabetes mellitus ([place])      SURGICAL HISTORY   Past Surgical History:  Procedure Laterality Date  . ABDOMINAL HYSTERECTOMY    . ABLATION     July 2017  . ADRENALECTOMY Left 1980's   "[place]"  . APPENDECTOMY    . BREAST CYST EXCISION Left   . CATARACT EXTRACTION W/PHACO Right 12/28/2015   Procedure: CATARACT EXTRACTION PHACO AND INTRAOCULAR LENS PLACEMENT (IOC);  Surgeon: Birder Robson, MD;  Location: [facility];  Service: Ophthalmology;  Laterality: Right;  [place] 48.4   . CATARACT EXTRACTION W/PHACO Left 11/14/2016   Procedure: CATARACT EXTRACTION PHACO AND INTRAOCULAR LENS PLACEMENT (IOC);  Surgeon: Birder Robson, MD;  Location: [facility];  Service: Ophthalmology;  Laterality: Left;  [place] 59.8 AP% 18.1 CDE 10.78 Fluid pack lot # [id number]  . CHOLECYSTECTOMY    . CORONARY ANGIOPLASTY WITH STENT PLACEMENT  12/23/2014  . ELECTROPHYSIOLOGIC STUDY N/A 03/08/2016   Procedure: CARDIOVERSION;  Surgeon: Wende Bushy, MD;  Location: [facility];  Service: Cardiovascular;  Laterality: N/A;  . ELECTROPHYSIOLOGIC STUDY N/A 03/07/2016   Procedure: Cardioversion;  Surgeon: Wende Bushy, MD;  Location: [facility];  Service: Cardiovascular;  Laterality: N/A;  . ELECTROPHYSIOLOGIC STUDY N/A 03/31/2016   Procedure: AV Node Ablation;  Surgeon: Will Meredith Leeds, MD;  Location: [facility];  Service: Cardiovascular;  Laterality: N/A;  . EP IMPLANTABLE DEVICE N/A 03/31/2016   Procedure: BiV Pacemaker Insertion CRT-P;  Surgeon: Will Meredith Leeds, MD;  Location: [place] [facility];  Service: Cardiovascular;  Laterality: N/A;  . ESOPHAGOGASTRODUODENOSCOPY (EGD) WITH PROPOFOL N/A 01/26/2017    Procedure: ESOPHAGOGASTRODUODENOSCOPY (EGD) WITH PROPOFOL;  Surgeon: [name], MD;  Location: [facility];  Service: Endoscopy;  Laterality: N/A;  . FRACTURE SURGERY    . HIP PINNING,CANNULATED Left 02/01/2017   Procedure: CANNULATED HIP PINNING;  Surgeon: Lovell Sheehan, MD;  Location: [facility];  Service: Orthopedics;  Laterality: Left;  . INSERT / REPLACE / REMOVE PACEMAKER  02/2016  . LEFT HEART CATHETERIZATION WITH CORONARY ANGIOGRAM N/A 12/23/2014   Procedure: LEFT HEART CATHETERIZATION WITH CORONARY ANGIOGRAM;  Surgeon: Burnell Blanks, MD;  Location[facility];  Service: Cardiovascular;  Laterality: N/A;  . PERCUTANEOUS CORONARY STENT INTERVENTION (PCI-S)  12/23/2014   Procedure: PERCUTANEOUS CORONARY STENT INTERVENTION (PCI-S);  Surgeon: Burnell Blanks, MD;  Location: [facility] CATH LAB;  Service: Cardiovascular;;  Promus 2.25x8  . TONSILLECTOMY    . TRANSTHORACIC ECHOCARDIOGRAM  11/26/2015   Technically difficult study. EF 55-60%. Normal wall motion. GR 1 DD.  . TUBAL LIGATION    . WRIST FRACTURE SURGERY Bilateral ~ 2000     FAMILY HISTORY   Family History  Problem Relation Age of Onset  . Heart disease Mother   . Diabetes Mother   . Osteoarthritis Mother   . Hypertension Mother   . Heart disease Father   . Hypertension Father   . COPD Brother      SOCIAL HISTORY   Social History   Tobacco Use  . Smoking status: Former Smoker    Packs/day: 1.00    Years: 45.00    Pack years: 45.00    Types: Cigarettes    Last attempt to quit: 04/25/1994    Years since quitting: 24.6  . Smokeless tobacco: Never Used  Substance Use Topics  . Alcohol use: No    Comment: 12/23/2014 "might have a couple mixed drinks/year"  . Drug use: No     MEDICATIONS   Current Medication:  Current Facility-Administered Medications:  .  0.9 %  sodium chloride infusion, 250 mL, Intravenous, PRN, Seals, Angela H, NP .  aspirin EC tablet 81 mg, 81 mg, Oral, Daily, Seals,  Angela H, NP, 81 mg at 12/27/18 0941 .  busPIRone (BUSPAR) tablet 7.5 mg, 7.5 mg, Oral, BID, Seals, Angela H, NP, 7.5 mg at 12/27/18 0941 .  feeding supplement (GLUCERNA SHAKE) (GLUCERNA SHAKE) liquid 237 mL, 237 mL, Oral, TID BM, Mody, Sital, MD .  fluticasone furoate-vilanterol (BREO ELLIPTA) 100-25 MCG/INH 1 puff, 1 puff, Inhalation, Daily, Seals, Angela H, NP, 1 puff at 12/27/18 0939 .  furosemide (LASIX) injection 60 mg, 60 mg, Intravenous, BID, End, Christopher, MD, 60 mg at 12/27/18 1721 .  levalbuterol (XOPENEX) nebulizer solution 0.63 mg, 0.63 mg, Nebulization, Q6H, Gollan, Kathlene November, MD, 0.63 mg at 12/28/18 0202 .  metoprolol succinate (TOPROL-XL) 24 hr tablet 25 mg, 25 mg, Oral, Daily, Seals, Angela H, NP, 25 mg at 12/27/18 0941 .  multivitamin with minerals tablet 1 tablet, 1 tablet, Oral, Daily,  Bettey Costa, MD, 1 tablet at 12/27/18 0941 .  nitroGLYCERIN (NITROSTAT) SL tablet 0.4 mg, 0.4 mg, Sublingual, Q5 Min x 3 PRN, Seals, Angela H, NP .  ondansetron (ZOFRAN) injection 4 mg, 4 mg, Intravenous, Q6H PRN, Seals, Angela H, NP, 4 mg at 12/27/18 0310 .  potassium chloride SA (K-DUR) CR tablet 20 mEq, 20 mEq, Oral, BID, Visser, Jacquelyn D, PA-C, 20 mEq at 12/27/18 0940 .  sertraline (ZOLOFT) tablet 50 mg, 50 mg, Oral, Daily, Seals, Angela H, NP, 50 mg at 12/27/18 0941 .  sodium chloride flush (NS) 0.9 % injection 3 mL, 3 mL, Intravenous, Q12H, Seals, Angela H, NP, 3 mL at 12/27/18 2212 .  sodium chloride flush (NS) 0.9 % injection 3 mL, 3 mL, Intravenous, PRN, Seals, Levada Dy H, NP .  tiotropium (SPIRIVA) inhalation capsule ([facility] use ONLY) 18 mcg, 18 mcg, Inhalation, Daily PRN, Seals, Angela H, NP .  traMADol (ULTRAM) tablet 50 mg, 50 mg, Oral, Q12H PRN, Mansy, Jan A, MD, 50 mg at 12/27/18 0310 .  vitamin B-12 (CYANOCOBALAMIN) tablet 1,000 mcg, 1,000 mcg, Oral, Daily, Seals, Angela H, NP, 1,000 mcg at 12/27/18 8119    ALLERGIES   Ciprofloxacin; Doxycycline; Penicillins; Sulfa  antibiotics; Latex; Morphine and related; Albuterol sulfate; Macrobid [nitrofurantoin macrocrystal]; and Cefuroxime    REVIEW OF SYSTEMS    Unable to obtain due to critically ill state.   PHYSICAL EXAMINATION   Vitals:   12/28/18 0500 12/28/18 0600  BP: (!) 154/54 (!) 142/51  Pulse: 75 70  Resp: (!) 24 (!) 22  Temp:  (!) 97.1 F (36.2 C)  SpO2: 90% 94%    GENERAL:Respiratory distress due SOB and vomiting HEAD: Normocephalic, atraumatic.  EYES: Pupils equal, round, reactive to light.  No scleral icterus.  MOUTH: Moist mucosal membrane. NECK: Supple. No thyromegaly. No nodules. No JVD.  PULMONARY: bilateral rhonchorous breath sounds.  CARDIOVASCULAR: S1 and S2. Regular rate and rhythm. No murmurs, rubs, or gallops.  GASTROINTESTINAL: Soft, nontender, non-distended. No masses. Positive bowel sounds. No hepatosplenomegaly.  MUSCULOSKELETAL: No swelling, clubbing, or edema.  NEUROLOGIC: Mild distress due to acute illness SKIN:intact,warm,dry   LABS AND IMAGING    LAB RESULTS: Recent Labs  Lab 12/26/18 0350 12/27/18 0423 12/28/18 0355  NA 141 143 146*  K 3.5 3.5 4.0  CL 88* 89* 89*  CO2 37* 37* 38*  BUN 86* 89* 95*  CREATININE 2.22* 2.07* 2.06*  GLUCOSE 140* 127* 146*   Recent Labs  Lab 12/12/2018 2045 12/25/18 0558 12/26/18 0350  HGB 10.0* 10.0* 9.5*  HCT 34.0* 33.4* 31.9*  WBC 12.9* 5.8 8.3  PLT 167 139* 120*     IMAGING RESULTS: Dg Chest 1 View  Result Date: 12/27/2018 CLINICAL DATA:  Shortness of breath.  Diarrhea. EXAM: CHEST  1 VIEW COMPARISON:  Chest x-rays dated 12/25/2018 and 12/11/2018 10/28/2018 FINDINGS: The patient has developed hazy infiltrates in both mid zones. There is a small left pleural effusion. Chronic cardiomegaly. The pulmonary vascularity appears within normal limits. Pacemaker in place. Aortic atherosclerosis.  Stable scarring in the right lung apex. No acute bone abnormality. IMPRESSION: New hazy bilateral perihilar and midzone  infiltrates. This could represent pneumonia or pulmonary edema. Small left pleural effusion. Aortic Atherosclerosis (ICD10-I70.0). Electronically Signed   By: Lorriane Shire M.D.   On: 12/27/2018 12:21   Dg Abd 1 View  Result Date: 12/27/2018 CLINICAL DATA:  Constipation.  Vomiting. EXAM: ABDOMEN - 1 VIEW COMPARISON:  CT scan of the abdomen dated 10/28/2018 and radiograph dated  03/16/2016 FINDINGS: The bowel gas pattern is normal. No evidence of excessive stool in the colon. No fecal impaction. No visible free air or free fluid on the supine radiograph. Cardiomegaly. Pacemaker. Chronic blunting of the left costophrenic angle. No acute bone abnormalities. IMPRESSION: No acute abnormality of the abdomen or pelvis. Electronically Signed   By: Lorriane Shire M.D.   On: 12/27/2018 12:24   US Venous Img Upper Uni Left  Result Date: 12/27/2018 CLINICAL DATA:  Left upper extremity edema. History of superficial thrombophlebitis basilic vein branch in 3419. EXAM: LEFT UPPER EXTREMITY VENOUS DOPPLER ULTRASOUND TECHNIQUE: Gray-scale sonography with graded compression, as well as color Doppler and duplex ultrasound were performed to evaluate the upper extremity deep venous system from the level of the subclavian vein and including the jugular, axillary, basilic, radial, ulnar and upper cephalic vein. Spectral Doppler was utilized to evaluate flow at rest and with distal augmentation maneuvers. COMPARISON:  11/27/2015 FINDINGS: Contralateral Subclavian Vein: Respiratory phasicity is normal and symmetric with the symptomatic side. No evidence of thrombus. Normal compressibility. Internal Jugular Vein: No evidence of thrombus. Normal compressibility, respiratory phasicity and response to augmentation. Subclavian Vein: No evidence of thrombus. Normal compressibility, respiratory phasicity and response to augmentation. Axillary Vein: No evidence of thrombus. Normal compressibility, respiratory phasicity and response to  augmentation. Cephalic Vein: No evidence of thrombus. Normal compressibility, respiratory phasicity and response to augmentation. Basilic Vein: No evidence of thrombus. Normal compressibility, respiratory phasicity and response to augmentation. Brachial Veins: No evidence of thrombus. Normal compressibility, respiratory phasicity and response to augmentation. Radial Veins: No evidence of thrombus. Normal compressibility, respiratory phasicity and response to augmentation. Ulnar Veins: No evidence of thrombus. Normal compressibility, respiratory phasicity and response to augmentation. Venous Reflux:  None visualized. Other Findings: No evidence of superficial thrombophlebitis or abnormal fluid collection. IMPRESSION: No evidence of DVT within the left upper extremity. Electronically Signed   By: Aletta Edouard M.D.   On: 12/27/2018 15:38      ASSESSMENT AND PLAN    -Multidisciplinary rounds held today    ACUTE ON CHRONIC HYPOXEMIC RESPIRATORY FAILURE           likey multifactorial including :  Acute decompensated diastolic CHF EF >37%           Evidenced by pulmonary edema on cxr as well as bnp >2300 and crackles on auscultation                     -will continue lasix for now due to upper extremity edema with crackles on auscultation.  Severe COPD on chronic O2 at home- sees Dr Mortimer Fries on outpatient - continue copd care path            Has solumedrol allergy.  Not producing phlegm which would be indicative of AECOPD but she has significant rhonchi and crackles with increased O2 req - will give trial of pred burst.  Hx of Lung CA s/p rads with resultant focal fibrotic areas - noted VQ scan above , no PE -left anterior oblique and right posterior oblique defects likely due to residual scarring - low prob study   Likely pulmonary HTN group 2/3 - as evidenced by RVSP 67 -will need RHC to delineate whether isolated postcapillary or mixed  -vomited into BIAPAP interface will transition to [place]  -diuresis as tolerated  -COPD carepath with duonebs zithromax and prednisone    NSTEMI    - cardio on case- appreciate input - following recs -oxygen as needed -Lasix  as tolerated -follow up cardiac enzymes as indicated ICU monitoring    Renal Failure-CKD      -minimize nephortoxic meds -follow chem 7 -follow UO -continue Foley Catheter-assess need daily    Transaminitis       -Non-cholestatic pattern     -In lieu of NVD on this admit would be prudent to rule out infectious hepatitis -work-up initiated     -Unlikely shock liver as patient was never hypotensive throughout this admission     -Possible metastasis to the liver will obtain AFP and CEA     -Reduction of hepatotoxic medications throughout this admission-have dcd tylenol     Left Upper extermity Swelling     - US -venous study r/o VTE     GI/Nutrition GI PROPHYLAXIS as indicated DIET-->TF's as tolerated Constipation protocol as indicated  ENDO - ICU hypoglycemic\Hyperglycemia protocol -check FSBS per protocol   ELECTROLYTES -follow labs as needed -replace as needed -pharmacy consultation   DVT/GI PRX ordered -SCDs  TRANSFUSIONS AS NEEDED MONITOR FSBS ASSESS the need for LABS as needed   Critical care provider statement:   Critical care time (minutes): 39  Critical care time was exclusive of: Separately billable procedures and treating other patients  Critical care was necessary to treat or prevent imminent or life-threatening deterioration of the following conditions: Acute hypoxemic resp failure, acute CHF exacerbation, severe COPD, asthma, transaminitis, multiple comorbid conditions   Critical care was time spent personally by me on the following activities: Development of treatment plan with patient or surrogate, discussions with consultants, evaluation of patient's response to treatment, examination of patient, obtaining history from patient or surrogate,  ordering and performing treatments and interventions, ordering and review of laboratory studies and re-evaluation of patient's condition.  I assumed direction of critical care for this patient from another provider in my specialty: no      This document was prepared using Dragon voice recognition software and may include unintentional dictation errors.    Ottie Glazier, M.D.  Division of [place]

## 2018-12-28 NOTE — Progress Notes (Signed)
Progress Note  Patient Name: Regina Burke Date of Encounter: 12/28/2018  Primary Cardiologist: Ida Rogue, MD   Subjective   Feeling short of breath, thirsty and nauseous.  Generally feeling poor.  Inpatient Medications    Scheduled Meds: . aspirin EC  81 mg Oral Daily  . busPIRone  7.5 mg Oral BID  . feeding supplement (GLUCERNA SHAKE)  237 mL Oral TID BM  . fluticasone furoate-vilanterol  1 puff Inhalation Daily  . furosemide  60 mg Intravenous BID  . levalbuterol  0.63 mg Nebulization Q6H  . metoprolol succinate  25 mg Oral Daily  . multivitamin with minerals  1 tablet Oral Daily  . sertraline  50 mg Oral Daily  . sodium chloride flush  3 mL Intravenous Q12H  . vitamin B-12  1,000 mcg Oral Daily   Continuous Infusions: . sodium chloride     PRN Meds: sodium chloride, nitroGLYCERIN, ondansetron (ZOFRAN) IV, sodium chloride flush, tiotropium, traMADol   Vital Signs    Vitals:   12/28/18 1200 12/28/18 1220 12/28/18 1221 12/28/18 1225  BP: (!) 156/55     Pulse: 70 72 71 72  Resp: 19 18 (!) 25 20  Temp: 97.9 F (36.6 C)     TempSrc: Axillary     SpO2: 98% (!) 81% (!) 89% 93%  Weight:      Height:        Intake/Output Summary (Last 24 hours) at 12/28/2018 1359 Last data filed at 12/28/2018 1200 Gross per 24 hour  Intake 140 ml  Output 350 ml  Net -210 ml   Last 3 Weights 12/28/2018 12/27/2018 12/27/2018  Weight (lbs) 144 lb 6.4 oz 144 lb 2.9 oz 145 lb 11.2 oz  Weight (kg) 65.5 kg 65.4 kg 66.089 kg      Telemetry    VP 70 bpm. - Personally Reviewed  ECG    VP 71 bpm.   - Personally Reviewed  Physical Exam   VS:  BP (!) 156/55 (BP Location: Right Arm)   Pulse 72   Temp 97.9 F (36.6 C) (Axillary)   Resp 20   Ht 5\' 4"  (1.626 m)   Wt 65.5 kg   SpO2 93%   BMI 24.79 kg/m  , BMI Body mass index is 24.79 kg/m. GENERAL: Critically ill-appearing.  +Respiratory distress. HEENT: Pupils equal round and reactive, fundi not visualized, oral mucosa  unremarkable NECK: Difficult to assess jugular venous distention, waveform within normal limits, carotid upstroke brisk and symmetric, no bruits, no thyromegaly LUNGS:  Tight, coarse breath sounds.  No clear wheezes or crackles.  Poor air movement.  HEART:  RRR.  PMI not displaced or sustained,S1 and S2 within normal limits, no S3, no S4, no clicks, no rubs, no murmurs ABD:  Flat, positive bowel sounds normal in frequency in pitch, no bruits, no rebound, no guarding, no midline pulsatile mass, no hepatomegaly, no splenomegaly EXT:  1 plus DP/PT bilaterally, no edema, + cyanosis of toes and heels. no clubbing SKIN:  Feet mottled NEURO:  Cranial nerves II through XII grossly intact, motor grossly intact throughout [facility]:  Cognitively intact, oriented to person place and time   Labs    Chemistry Recent Labs  Lab 12/26/18 0350 12/27/18 0423 12/28/18 0355  NA 141 143 146*  K 3.5 3.5 4.0  CL 88* 89* 89*  CO2 37* 37* 38*  GLUCOSE 140* 127* 146*  BUN 86* 89* 95*  CREATININE 2.22* 2.07* 2.06*  CALCIUM 8.0* 8.8* 8.8*  PROT 6.1*  6.6 6.8  ALBUMIN 2.9* 3.1* 3.2*  AST 469* 173* 77*  ALT 450* 316* 225*  ALKPHOS 63 65 68  BILITOT 2.3* 3.3* 2.7*  GFRNONAA 20* 22* 22*  GFRAA 23* 25* 25*  ANIONGAP 16* 17* 19*     Hematology Recent Labs  Lab 12/26/2018 2045 12/25/18 0558 12/26/18 0350  WBC 12.9* 5.8 8.3  RBC 3.99 4.04 3.84*  HGB 10.0* 10.0* 9.5*  HCT 34.0* 33.4* 31.9*  MCV 85.2 82.7 83.1  MCH 25.1* 24.8* 24.7*  MCHC 29.4* 29.9* 29.8*  RDW 18.5* 18.8* 18.9*  PLT 167 139* 120*    Cardiac Enzymes Recent Labs  Lab 12/22/2018 2045 12/24/18 0229 12/24/18 0721  TROPONINI 5.35* 5.56* 5.02*   No results for input(s): TROPIPOC in the last 168 hours.   BNP Recent Labs  Lab 12/26/2018 2049  BNP 2,346.0*     DDimer No results for input(s): DDIMER in the last 168 hours.   Radiology    Dg Chest 1 View  Result Date: 12/27/2018 CLINICAL DATA:  Shortness of breath.  Diarrhea. EXAM:  CHEST  1 VIEW COMPARISON:  Chest x-rays dated 12/25/2018 and 12/03/2018 10/28/2018 FINDINGS: The patient has developed hazy infiltrates in both mid zones. There is a small left pleural effusion. Chronic cardiomegaly. The pulmonary vascularity appears within normal limits. Pacemaker in place. Aortic atherosclerosis.  Stable scarring in the right lung apex. No acute bone abnormality. IMPRESSION: New hazy bilateral perihilar and midzone infiltrates. This could represent pneumonia or pulmonary edema. Small left pleural effusion. Aortic Atherosclerosis (ICD10-I70.0). Electronically Signed   By: Lorriane Shire M.D.   On: 12/27/2018 12:21   Dg Abd 1 View  Result Date: 12/27/2018 CLINICAL DATA:  Constipation.  Vomiting. EXAM: ABDOMEN - 1 VIEW COMPARISON:  CT scan of the abdomen dated 10/28/2018 and radiograph dated 03/16/2016 FINDINGS: The bowel gas pattern is normal. No evidence of excessive stool in the colon. No fecal impaction. No visible free air or free fluid on the supine radiograph. Cardiomegaly. Pacemaker. Chronic blunting of the left costophrenic angle. No acute bone abnormalities. IMPRESSION: No acute abnormality of the abdomen or pelvis. Electronically Signed   By: Lorriane Shire M.D.   On: 12/27/2018 12:24   US Venous Img Upper Uni Left  Result Date: 12/27/2018 CLINICAL DATA:  Left upper extremity edema. History of superficial thrombophlebitis basilic vein branch in 7341. EXAM: LEFT UPPER EXTREMITY VENOUS DOPPLER ULTRASOUND TECHNIQUE: Gray-scale sonography with graded compression, as well as color Doppler and duplex ultrasound were performed to evaluate the upper extremity deep venous system from the level of the subclavian vein and including the jugular, axillary, basilic, radial, ulnar and upper cephalic vein. Spectral Doppler was utilized to evaluate flow at rest and with distal augmentation maneuvers. COMPARISON:  11/27/2015 FINDINGS: Contralateral Subclavian Vein: Respiratory phasicity is normal and  symmetric with the symptomatic side. No evidence of thrombus. Normal compressibility. Internal Jugular Vein: No evidence of thrombus. Normal compressibility, respiratory phasicity and response to augmentation. Subclavian Vein: No evidence of thrombus. Normal compressibility, respiratory phasicity and response to augmentation. Axillary Vein: No evidence of thrombus. Normal compressibility, respiratory phasicity and response to augmentation. Cephalic Vein: No evidence of thrombus. Normal compressibility, respiratory phasicity and response to augmentation. Basilic Vein: No evidence of thrombus. Normal compressibility, respiratory phasicity and response to augmentation. Brachial Veins: No evidence of thrombus. Normal compressibility, respiratory phasicity and response to augmentation. Radial Veins: No evidence of thrombus. Normal compressibility, respiratory phasicity and response to augmentation. Ulnar Veins: No evidence of thrombus. Normal compressibility,  respiratory phasicity and response to augmentation. Venous Reflux:  None visualized. Other Findings: No evidence of superficial thrombophlebitis or abnormal fluid collection. IMPRESSION: No evidence of DVT within the left upper extremity. Electronically Signed   By: Aletta Edouard M.D.   On: 12/27/2018 15:38    Cardiac Studies   Echo 12/24/18: IMPRESSIONS   1. The left ventricle has normal systolic function with an ejection fraction of 60-65%. The cavity size was normal. There is mildly increased left ventricular wall thickness. Left ventricular diastolic function could not be evaluated secondary to atrial  fibrillation. There is right ventricular volume and pressure overload.  2. The right ventricle has moderately reduced systolic function. The cavity was moderately enlarged. There is moderately increased right ventricular wall thickness. Right ventricular systolic pressure is moderately to severely elevated with an estimated  pressure of 67 mmHg.  3.  Left atrial size was severely dilated.  4. Right atrial size was moderately dilated.  5. Mildly thickened tricuspid valve leaflets.  6. The mitral valve is degenerative. Mild thickening of the mitral valve leaflet. There is moderate mitral annular calcification present. Mitral valve regurgitation is mild to moderate by color flow Doppler.  7. The tricuspid valve is abnormal. Tricuspid valve regurgitation is moderate-severe.  8. The aortic valve is tricuspid. Moderate thickening of the aortic valve. Mild calcification of the aortic valve. Aortic valve regurgitation is trivial by color flow Doppler. Mild stenosis of the aortic valve.  9. The inferior vena cava was dilated in size with <50% respiratory variability. 10. The interatrial septum appears to be lipomatous. 11. Pericardial thickening is suspected.  Patient Profile     85 y.o. female with COPD on chronic O2, persistent atrial fibrillation, CAD s/p PCI, s/p AV node ablation, s/p CRT-P, anemia requiring epo, DM, and lung cancer here with acute on chronic hypoxic respiratory failure and acute on chronic diastolic heart failure.   Assessment & Plan    # Acute on chronic hypoxic respiratory failure: # Acute on chronic diastolic heart failure: # COPD exacerbation: Patient continues with hypoxia and respiratory failure.  On exam today she was on bipap and complaining of nausea.  Small amount of emesis on BiPAP mask.  Mask was removed 2/2 aspiration risk and she was placed on high flow nasal canula.  Acute on chronic diastolic heart failure is certainly contributing, but volume overload doesn't seem to be the main culprit at this time.  She has no LE edema and no crackles on exam.  Sodium is 146.  Would hold on IV diuresis for now.  Would consider IV steroids and antibiotics, though she is not actively wheezing or coughing.  There was some concern for infiltrate on her chest x-ray yesterday.  We will hold IV Lasix and switch to her home torsemide  tomorrow.  #Persistent atrial fibrillation: # s/p AV node Ablation: EKG from yesterday appears to show some P waves.  She is status post AV node ablation so her an underlying atrial rhythm is insignificant.  Continue metoprolol.  She is not on anticoagulation due to severe anemia.  # CAD: Patient has a history of PCI in 2016 in the setting of an an NSTEMI.  # Hypertension: Continue metoprolol.  Blood pressure is elevated but would not be more aggressive at this time given her acute respiratory distress and concern for decompensation.  # Hyperlipidemia: Continue fenofibrate and atorvastatin.    This patient is critically ill.  Plan was discussed with the pulmonary/critical care physician.  Agree with palliative  care consultation.  She is DNR.  Time spent: 45 minutes-Greater than 50% of this time was spent in counseling, explanation of diagnosis, planning of further management, and coordination of care.   For questions or updates, please contact [place] Please consult [email or web address] for contact info under        Signed, Skeet Latch, MD  12/28/2018, 1:59 PM

## 2018-12-28 NOTE — Progress Notes (Addendum)
[facility] NAME: Regina Burke    MR#:  [id number]  DATE OF BIRTH:  07-Nov-1932  SUBJECTIVE:  CHIEF COMPLAINT:   Chief Complaint  Patient presents with  . Altered Mental Status  . Shortness of Breath   - feels about the same. Remains on Bipap  REVIEW OF SYSTEMS:  Review of Systems  Constitutional: Positive for malaise/fatigue. Negative for chills and fever.  HENT: Negative for ear discharge, hearing loss and nosebleeds.   Eyes: Negative for blurred vision and double vision.  Respiratory: Positive for shortness of breath. Negative for cough and wheezing.   Cardiovascular: Negative for chest pain, palpitations and leg swelling.  Gastrointestinal: Negative for abdominal pain, constipation, diarrhea, nausea and vomiting.  Genitourinary: Negative for dysuria.  Musculoskeletal: Negative for myalgias.  Neurological: Negative for dizziness, seizures, weakness and headaches.  Psychiatric/Behavioral: Negative for depression.    DRUG ALLERGIES:   Allergies  Allergen Reactions  . Ciprofloxacin Shortness Of Breath, Itching and Rash  . Doxycycline Shortness Of Breath, Itching and Rash  . Penicillins Shortness Of Breath, Itching, Rash and Other (See Comments)    Has patient had a PCN reaction causing immediate rash, facial/tongue/throat swelling, SOB or lightheadedness with hypotension: Yes Has patient had a PCN reaction causing severe rash involving mucus membranes or skin necrosis: No Has patient had a PCN reaction that required hospitalization No Has patient had a PCN reaction occurring within the last 10 years: No If all of the above answers are "NO", then may proceed with Cephalosporin use.  . Sulfa Antibiotics Shortness Of Breath, Itching and Rash  . Latex Itching  . Morphine And Related Itching  . Albuterol Sulfate   . Macrobid [Nitrofurantoin Macrocrystal] Itching  . Cefuroxime Rash    Blisters in mouth    VITALS:   Blood pressure (!) 154/63, pulse 71, temperature 97.7 F (36.5 C), temperature source Axillary, resp. rate (!) 23, height 5\' 4"  (1.626 m), weight 65.5 kg, SpO2 94 %.  PHYSICAL EXAMINATION:  Physical Exam   GENERAL:  85 y.o.-year-old patient lying in the bed with no acute distress.  EYES: matted right eye lashes.  Pupils equal, round, reactive to light and accommodation. No scleral icterus. Extraocular muscles intact.  HEENT: Head atraumatic, normocephalic. Oropharynx and nasopharynx clear.  NECK:  Supple, no jugular venous distention. No thyroid enlargement, no tenderness.  LUNGS: Normal breath sounds bilaterally, no wheezing, rhonchi or crepitation. Bibasilar crackles. No use of accessory muscles of respiration. Decreased at the bases CARDIOVASCULAR: S1, S2 normal. No  rubs, or gallops. 2/6 systolic murmur present ABDOMEN: Soft, nontender, nondistended. Bowel sounds present. No organomegaly or mass.  EXTREMITIES: No pedal edema, cyanosis, or clubbing.  NEUROLOGIC: Cranial nerves II through XII are intact. Muscle strength 5/5 in all extremities. Sensation intact. Gait not checked. Global weakness noted PSYCHIATRIC: The patient is alert and oriented x 2. Able to speak in full sentences SKIN: No obvious rash, lesion, or ulcer.    LABORATORY PANEL:   CBC Recent Labs  Lab 12/26/18 0350  WBC 8.3  HGB 9.5*  HCT 31.9*  PLT 120*   ------------------------------------------------------------------------------------------------------------------  Chemistries  Recent Labs  Lab 12/28/18 0355  NA 146*  K 4.0  CL 89*  CO2 38*  GLUCOSE 146*  BUN 95*  CREATININE 2.06*  CALCIUM 8.8*  MG 1.9  AST 77*  ALT 225*  ALKPHOS 68  BILITOT 2.7*   ------------------------------------------------------------------------------------------------------------------  Cardiac Enzymes Recent Labs  Lab 12/24/18 [phone]  TROPONINI 5.02*    ------------------------------------------------------------------------------------------------------------------  RADIOLOGY:  Dg Chest 1 View  Result Date: 12/27/2018 CLINICAL DATA:  Shortness of breath.  Diarrhea. EXAM: CHEST  1 VIEW COMPARISON:  Chest x-rays dated 12/25/2018 and 12/21/2018 10/28/2018 FINDINGS: The patient has developed hazy infiltrates in both mid zones. There is a small left pleural effusion. Chronic cardiomegaly. The pulmonary vascularity appears within normal limits. Pacemaker in place. Aortic atherosclerosis.  Stable scarring in the right lung apex. No acute bone abnormality. IMPRESSION: New hazy bilateral perihilar and midzone infiltrates. This could represent pneumonia or pulmonary edema. Small left pleural effusion. Aortic Atherosclerosis (ICD10-I70.0). Electronically Signed   By: Lorriane Shire M.D.   On: 12/27/2018 12:21   Dg Abd 1 View  Result Date: 12/27/2018 CLINICAL DATA:  Constipation.  Vomiting. EXAM: ABDOMEN - 1 VIEW COMPARISON:  CT scan of the abdomen dated 10/28/2018 and radiograph dated 03/16/2016 FINDINGS: The bowel gas pattern is normal. No evidence of excessive stool in the colon. No fecal impaction. No visible free air or free fluid on the supine radiograph. Cardiomegaly. Pacemaker. Chronic blunting of the left costophrenic angle. No acute bone abnormalities. IMPRESSION: No acute abnormality of the abdomen or pelvis. Electronically Signed   By: Lorriane Shire M.D.   On: 12/27/2018 12:24   US Venous Img Upper Uni Left  Result Date: 12/27/2018 CLINICAL DATA:  Left upper extremity edema. History of superficial thrombophlebitis basilic vein branch in 0277. EXAM: LEFT UPPER EXTREMITY VENOUS DOPPLER ULTRASOUND TECHNIQUE: Gray-scale sonography with graded compression, as well as color Doppler and duplex ultrasound were performed to evaluate the upper extremity deep venous system from the level of the subclavian vein and including the jugular, axillary, basilic, radial,  ulnar and upper cephalic vein. Spectral Doppler was utilized to evaluate flow at rest and with distal augmentation maneuvers. COMPARISON:  11/27/2015 FINDINGS: Contralateral Subclavian Vein: Respiratory phasicity is normal and symmetric with the symptomatic side. No evidence of thrombus. Normal compressibility. Internal Jugular Vein: No evidence of thrombus. Normal compressibility, respiratory phasicity and response to augmentation. Subclavian Vein: No evidence of thrombus. Normal compressibility, respiratory phasicity and response to augmentation. Axillary Vein: No evidence of thrombus. Normal compressibility, respiratory phasicity and response to augmentation. Cephalic Vein: No evidence of thrombus. Normal compressibility, respiratory phasicity and response to augmentation. Basilic Vein: No evidence of thrombus. Normal compressibility, respiratory phasicity and response to augmentation. Brachial Veins: No evidence of thrombus. Normal compressibility, respiratory phasicity and response to augmentation. Radial Veins: No evidence of thrombus. Normal compressibility, respiratory phasicity and response to augmentation. Ulnar Veins: No evidence of thrombus. Normal compressibility, respiratory phasicity and response to augmentation. Venous Reflux:  None visualized. Other Findings: No evidence of superficial thrombophlebitis or abnormal fluid collection. IMPRESSION: No evidence of DVT within the left upper extremity. Electronically Signed   By: Aletta Edouard M.D.   On: 12/27/2018 15:38    EKG:   Orders placed or performed during the hospital encounter of 12/16/2018  . EKG 12-Lead  . EKG 12-Lead  . EKG 12-Lead  . EKG 12-Lead  . EKG 12-lead  . EKG 12-Lead  . EKG 12-Lead  . EKG 12-Lead  . EKG 12-Lead  . EKG 12-Lead    ASSESSMENT AND PLAN:   85 year old female with past medical history significant for CAD, chronic combined heart failure, atrial fibrillation status post nodal ablation and biventricular  pacemaker, CKD stage IV, COPD, hypertension and diabetes, prior history of GI bleed admitted for shortness of breath.  1.  Acute hypoxic respiratory failure-secondary to acute  on chronic right heart failure-she is chronically on 3 L oxygen, currently transferred to ICU for BiPAP on 12/27/2018. -VQ scan showing low probability for PE. -Patient is being diuresed.  On IV Lasix.  Net negative of almost 5 L since admission. -Echocardiogram with normal EF, dilated and hypokinetic right ventricle.  Severe tricuspid regurgitation and moderate to severe pulmonary hypertension noted. -Appreciate cardiology input  2.  Acute transaminitis-secondary to hepatic congestion CHF. -Also early cirrhosis changes noted.  LFTs are improving with diuresis.  Continue to hold statin and fibrates  3.  Elevated troponin-consistent with demand ischemia per cardiology given heart failure. -Patient received heparin drip for 48 hours and has been discontinued.  Appreciate cardiology input. -No plans for invasive cardiac procedures at this time.  4.  CKD stage IV-monitor on diuresis - stable at this time  5. Permanent atrial fibrillation status post AV node ablation with biventricular ICD implantation: She is currently in normal sinus rhythm she has been off of anticoagulation due to prior GI bleed. -On low-dose metoprolol  6.  DVT prophylaxis-teds and SCDs  Overall poor prognosis.  Palliative care following     All the records are reviewed and case discussed with Care Management/Social Workerr. Management plans discussed with the patient, family and they are in agreement.  CODE STATUS: DNR  TOTAL TIME TAKING CARE OF THIS PATIENT: 38 minutes.   POSSIBLE D/C IN ? DAYS, DEPENDING ON CLINICAL CONDITION.   Gladstone Lighter M.D on 12/28/2018 at 12:05 PM  Between 7am to 6pm - Pager - [phone]  After 6pm go to [email or web address] - password [facility]  Office  [phone]  CC:  Primary care physician; Tracie Harrier, MD

## 2018-12-28 NOTE — Plan of Care (Signed)
  Problem: Activity: Goal: Will identify at least one activity in which they can participate Outcome: Not Progressing   Problem: Coping: Goal: Ability to identify and develop effective coping behavior will improve Outcome: Not Progressing Goal: Ability to interact with others will improve Outcome: Not Progressing Goal: Demonstration of participation in decision-making regarding own care will improve Outcome: Not Progressing Goal: Ability to use eye contact when communicating with others will improve Outcome: Not Progressing   Problem: Health Behavior/Discharge Planning: Goal: Identification of resources available to assist in meeting health care needs will improve Outcome: Not Progressing   Problem: Self-Concept: Goal: Will verbalize positive feelings about self Outcome: Not Progressing   Problem: Education: Goal: Knowledge of General Education information will improve Description Including pain rating scale, medication(s)/side effects and non-pharmacologic comfort measures Outcome: Not Progressing   Problem: Health Behavior/Discharge Planning: Goal: Ability to manage health-related needs will improve Outcome: Not Progressing   Problem: Clinical Measurements: Goal: Ability to maintain clinical measurements within normal limits will improve Outcome: Not Progressing Goal: Will remain free from infection Outcome: Not Progressing Goal: Diagnostic test results will improve Outcome: Not Progressing Goal: Respiratory complications will improve Outcome: Not Progressing Goal: Cardiovascular complication will be avoided Outcome: Not Progressing   Problem: Activity: Goal: Risk for activity intolerance will decrease Outcome: Not Progressing   Problem: Nutrition: Goal: Adequate nutrition will be maintained Outcome: Not Progressing   Problem: Coping: Goal: Level of anxiety will decrease Outcome: Not Progressing   Problem: Elimination: Goal: Will not experience complications  related to bowel motility Outcome: Not Progressing Goal: Will not experience complications related to urinary retention Outcome: Not Progressing   Problem: Pain Managment: Goal: General experience of comfort will improve Outcome: Not Progressing   Problem: Safety: Goal: Ability to remain free from injury will improve Outcome: Not Progressing   Problem: Skin Integrity: Goal: Risk for impaired skin integrity will decrease Outcome: Not Progressing   Problem: Education: Goal: Ability to demonstrate management of disease process will improve Outcome: Not Progressing Goal: Ability to verbalize understanding of medication therapies will improve Outcome: Not Progressing Goal: Individualized Educational Video(s) Outcome: Not Progressing   Problem: Activity: Goal: Capacity to carry out activities will improve Outcome: Not Progressing   Problem: Cardiac: Goal: Ability to achieve and maintain adequate cardiopulmonary perfusion will improve Outcome: Not Progressing

## 2018-12-28 NOTE — Progress Notes (Signed)
PT Cancellation Note  Patient Details Name: PAISLEI DORVAL MRN: [id number] DOB: Feb 01, 1933   Cancelled Treatment:    Reason Eval/Treat Not Completed: Medical issues which prohibited therapy(Patient noted with transfer to CCU due to change in medical status.  Per guidelines, will require new PT orders to resume service.  Please re-consult when medically appropriate.)   Kamiryn Bezanson H. Owens Shark, PT, DPT, NCS 12/28/18, 10:20 PM [phone]

## 2018-12-29 ENCOUNTER — Inpatient Hospital Stay: Payer: Medicare Other

## 2018-12-29 ENCOUNTER — Encounter: Payer: Self-pay | Admitting: Oncology

## 2018-12-29 DIAGNOSIS — I50813 Acute on chronic right heart failure: Secondary | ICD-10-CM

## 2018-12-29 DIAGNOSIS — J441 Chronic obstructive pulmonary disease with (acute) exacerbation: Secondary | ICD-10-CM

## 2018-12-29 LAB — BRAIN NATRIURETIC PEPTIDE: B Natriuretic Peptide: 3313 pg/mL — ABNORMAL HIGH (ref 0.0–100.0)

## 2018-12-29 LAB — COMPREHENSIVE METABOLIC PANEL
ALT: 147 U/L — ABNORMAL HIGH (ref 0–44)
AST: 43 U/L — ABNORMAL HIGH (ref 15–41)
Albumin: 3.2 g/dL — ABNORMAL LOW (ref 3.5–5.0)
Alkaline Phosphatase: 61 U/L (ref 38–126)
Anion gap: 18 — ABNORMAL HIGH (ref 5–15)
BUN: 117 mg/dL — ABNORMAL HIGH (ref 8–23)
CO2: 38 mmol/L — ABNORMAL HIGH (ref 22–32)
Calcium: 8.1 mg/dL — ABNORMAL LOW (ref 8.9–10.3)
Chloride: 91 mmol/L — ABNORMAL LOW (ref 98–111)
Creatinine, Ser: 2.59 mg/dL — ABNORMAL HIGH (ref 0.44–1.00)
GFR calc Af Amer: 19 mL/min — ABNORMAL LOW (ref >60)
GFR calc non Af Amer: 16 mL/min — ABNORMAL LOW (ref >60)
Glucose, Bld: 150 mg/dL — ABNORMAL HIGH (ref 70–99)
Potassium: 4.3 mmol/L (ref 3.5–5.1)
Sodium: 147 mmol/L — ABNORMAL HIGH (ref 135–145)
Total Bilirubin: 2.6 mg/dL — ABNORMAL HIGH (ref 0.3–1.2)
Total Protein: 6.3 g/dL — ABNORMAL LOW (ref 6.5–8.1)

## 2018-12-29 MED ORDER — GLYCOPYRROLATE 0.2 MG/ML IJ SOLN
0.1000 mg | INTRAMUSCULAR | Status: DC | PRN
  Administered 2018-12-29: 19:00:00 0.1 mg via INTRAVENOUS
  Filled 2018-12-29 (×2): qty 0.5

## 2018-12-29 MED ORDER — MORPHINE 100MG IN NS 100ML (1MG/ML) PREMIX INFUSION
1.0000 mg/h | INTRAVENOUS | Status: DC
  Administered 2018-12-29: 5 mg/h via INTRAVENOUS
  Filled 2018-12-29 (×3): qty 100

## 2018-12-31 ENCOUNTER — Other Ambulatory Visit: Payer: Medicare Other

## 2018-12-31 ENCOUNTER — Ambulatory Visit: Payer: Medicare Other

## 2018-12-31 ENCOUNTER — Ambulatory Visit: Payer: Medicare Other | Admitting: Oncology

## 2019-01-01 ENCOUNTER — Telehealth: Payer: Medicare Other | Admitting: Family

## 2019-01-27 NOTE — Progress Notes (Signed)
Pt transferred from ICU.  Pt able to say her name. Speech garbled. Denies pain. Dyspenic with increased secretions.  Morphine drip up to 8mg /hr. Robinul given with improvement.  Family at the bedside requested some time alone with pt. Unable to fully assess pt at this time.

## 2019-01-27 NOTE — TOC Progression Note (Signed)
Transition of Care [facility]) - Progression Note    Patient Details  Name: Regina Burke MRN: [id number] Date of Birth: Nov 22, 1932  Transition of Care [facility]) CM/SW Contact  Latanya Maudlin, RN Phone Number: 2019/01/09, 3:45 PM  Clinical Narrative:  It was expressed to [facility] that patient is wishing to cease BIPAP and seek comfort care. I have reached out to April with home hospice to begin referral process. Voicemail left with son [name] to discuss home with hospice options, discuss DME that is needed etc. Patient was being worked up for rehab but due to decline and increased oxygen requirements it does appear she meets hospice criteria per MD notes. I have asked April with Hospice to pass this referral on to Flo Shanks, hospice liaison in hopes that we are able to set up home with hospice tomorrow 5/4 if that maintains the plan and son is agreeable.      Expected Discharge Plan: Home w Hospice Care Barriers to Discharge: Continued Medical Work up  Expected Discharge Plan and Services Expected Discharge Plan: [place] Acute Care Choice: [place] arrangements for the past 2 months: Single Family Home Expected Discharge Date: 12/26/18                         HH Arranged: RN, PT, Nurse's Aide(ReDS Vest) [place] Agency: [facility] Date [place]: 12/25/18 Time [place]: [id number] Representative spoke with at [place]: [place] ([place]) Interventions    Readmission Risk Interventions Readmission Risk Prevention Plan 12/25/2018  Transportation Screening Complete  Medication Review Press photographer) Complete  PCP or Specialist appointment within 3-5 days of discharge Complete  HRI or [place] Complete  SW Recovery Care/Counseling Consult Complete  [place] Not Applicable  Some recent data might be hidden

## 2019-01-27 NOTE — Progress Notes (Signed)
Assisted tele visit to patient with family member.  Nabil Bubolz M, RN  

## 2019-01-27 NOTE — Discharge Summary (Signed)
   [place] at [facility]    Death Note : ----------------    Regina Burke [id number] is a 85 y.o. female, Outpatient Primary MD for the patient is Tracie Harrier, MD     85 year old female with past medical history significant for CAD,, chronic combined heart failure,   chronic respiratory failure secondary to COPD on 3 L home oxygen, atrial fibrillation status post nodal ablation and biventricular pacer, CKD stage IV, hypertension and diabetes and prior history of GI bleed presents to hospital secondary to shortness of breath. Patient was admitted to the floor for acute hypoxic respiratory failure secondary to CHF exacerbation, also underlying COPD.  She was transferred to ICU for BiPAP needs as her respiratory status has worsened. -She was diuresed with IV Lasix.  VQ scan showed low probability for PE.  Echocardiogram showing dilated and hypokinetic right ventricle with severe tricuspid regurgitation and pulmonary hypertension. -She also had elevated LFTs due to hepatic congestion and CHF with early cirrhotic changes only ultrasound.  Troponin was significantly elevated, received heparin drip however as troponins blood to do cardiology considered it to be demand ischemia rather than NSTEMI.  Renal function started to get worse.  She has stage IV CKD to begin with. -She also had encephalopathy with poor responsiveness and worsening respiratory status. -The ICU attending has discussed with patient's son and they have decided to make her comfort care. -Palliative care has seen the patient prior to transfer to ICU.  She is a DNR. -Patient was transferred out of ICU on comfort measures on 01/17/19.  She passed away comfortably at 9:46 PM on Jan 17, 2019.   Pronounced dead by RN on 01-17-2019  at  9:46PM       Cause of death: -----------------------  1.  Acute hypoxic respiratory failure 2. Acute on chronic CHF exacerbation 3.  Acute transaminitis  4.  Severe pulmonary hypertension 5.  Severe tricuspid regurgitation 6.  Demand ischemia 7.  Acute renal failure 8.  Permanent atrial fibrillation status post nodal ablation and pacemaker placement 9.  CKD stage IV 10.  COPD     Gladstone Lighter M.D on 12/30/2018 at 12:34 PM  [place] at [place]  Total clinical and documentation time for today Under 30 minutes   Last Note

## 2019-01-27 NOTE — Progress Notes (Signed)
[place] at [place] NAME: Regina Burke    MR#:  [id number]  DATE OF BIRTH:  1932-11-20  SUBJECTIVE:  CHIEF COMPLAINT:   Chief Complaint  Patient presents with  . Altered Mental Status  . Shortness of Breath   - on 5l o2 today, very sleepy, lethargic- opening eyes to touch - off Bipap ;last evening  REVIEW OF SYSTEMS:  Review of Systems  Constitutional: Positive for malaise/fatigue. Negative for chills and fever.  HENT: Negative for ear discharge, hearing loss and nosebleeds.   Eyes: Negative for blurred vision and double vision.  Respiratory: Positive for shortness of breath. Negative for cough and wheezing.   Cardiovascular: Negative for chest pain, palpitations and leg swelling.  Gastrointestinal: Negative for abdominal pain, constipation, diarrhea, nausea and vomiting.  Genitourinary: Negative for dysuria.  Musculoskeletal: Negative for myalgias.  Neurological: Negative for dizziness, seizures, weakness and headaches.  Psychiatric/Behavioral: Negative for depression.    DRUG ALLERGIES:   Allergies  Allergen Reactions  . Ciprofloxacin Shortness Of Breath, Itching and Rash  . Doxycycline Shortness Of Breath, Itching and Rash  . Penicillins Shortness Of Breath, Itching, Rash and Other (See Comments)    Has patient had a PCN reaction causing immediate rash, facial/tongue/throat swelling, SOB or lightheadedness with hypotension: Yes Has patient had a PCN reaction causing severe rash involving mucus membranes or skin necrosis: No Has patient had a PCN reaction that required hospitalization No Has patient had a PCN reaction occurring within the last 10 years: No If all of the above answers are "NO", then may proceed with Cephalosporin use.  . Sulfa Antibiotics Shortness Of Breath, Itching and Rash  . Latex Itching  . Morphine And Related Itching  . Albuterol Sulfate   . Macrobid [Nitrofurantoin Macrocrystal] Itching  .  Cefuroxime Rash    Blisters in mouth    VITALS:  Blood pressure (!) 106/55, pulse 70, temperature (!) 96.6 F (35.9 C), temperature source Axillary, resp. rate 18, height 5\' 4"  (1.626 m), weight 64 kg, SpO2 95 %.  PHYSICAL EXAMINATION:  Physical Exam   GENERAL:  85 y.o.-year-old patient lying in the bed with no acute distress. lethargic today EYES: matted right eye lashes.  Pupils equal, round, reactive to light and accommodation. No scleral icterus. Extraocular muscles intact.  HEENT: Head atraumatic, normocephalic. Oropharynx and nasopharynx clear.  NECK:  Supple, no jugular venous distention. No thyroid enlargement, no tenderness.  LUNGS: Normal breath sounds bilaterally, no wheezing, rhonchi or crepitation. Bibasilar crackles. No use of accessory muscles of respiration. Decreased at the bases CARDIOVASCULAR: S1, S2 normal. No  rubs, or gallops. 2/6 systolic murmur present ABDOMEN: Soft, nontender, nondistended. Bowel sounds present. No organomegaly or mass.  EXTREMITIES: No pedal edema, cyanosis, or clubbing.  NEUROLOGIC: Cranial nerves II through XII are intact. Not following commands today  Sensation intact. Gait not checked. Global weakness noted PSYCHIATRIC: The patient is lethargic, arousable SKIN: No obvious rash, lesion, or ulcer.    LABORATORY PANEL:   CBC Recent Labs  Lab 12/26/18 0350  WBC 8.3  HGB 9.5*  HCT 31.9*  PLT 120*   ------------------------------------------------------------------------------------------------------------------  Chemistries  Recent Labs  Lab 12/28/18 0355 09-Jan-2019 0315  NA 146* 147*  K 4.0 4.3  CL 89* 91*  CO2 38* 38*  GLUCOSE 146* 150*  BUN 95* 117*  CREATININE 2.06* 2.59*  CALCIUM 8.8* 8.1*  MG 1.9  --   AST 77* 43*  ALT 225* 147*  ALKPHOS 68  61  BILITOT 2.7* 2.6*   ------------------------------------------------------------------------------------------------------------------  Cardiac Enzymes Recent Labs  Lab  12/24/18 0721  TROPONINI 5.02*   ------------------------------------------------------------------------------------------------------------------  RADIOLOGY:  Dg Chest 1 View  Result Date: 12/27/2018 CLINICAL DATA:  Shortness of breath.  Diarrhea. EXAM: CHEST  1 VIEW COMPARISON:  Chest x-rays dated 12/25/2018 and 12/26/2018 10/28/2018 FINDINGS: The patient has developed hazy infiltrates in both mid zones. There is a small left pleural effusion. Chronic cardiomegaly. The pulmonary vascularity appears within normal limits. Pacemaker in place. Aortic atherosclerosis.  Stable scarring in the right lung apex. No acute bone abnormality. IMPRESSION: New hazy bilateral perihilar and midzone infiltrates. This could represent pneumonia or pulmonary edema. Small left pleural effusion. Aortic Atherosclerosis (ICD10-I70.0). Electronically Signed   By: Lorriane Shire M.D.   On: 12/27/2018 12:21   Dg Abd 1 View  Result Date: 12/27/2018 CLINICAL DATA:  Constipation.  Vomiting. EXAM: ABDOMEN - 1 VIEW COMPARISON:  CT scan of the abdomen dated 10/28/2018 and radiograph dated 03/16/2016 FINDINGS: The bowel gas pattern is normal. No evidence of excessive stool in the colon. No fecal impaction. No visible free air or free fluid on the supine radiograph. Cardiomegaly. Pacemaker. Chronic blunting of the left costophrenic angle. No acute bone abnormalities. IMPRESSION: No acute abnormality of the abdomen or pelvis. Electronically Signed   By: Lorriane Shire M.D.   On: 12/27/2018 12:24   US Venous Img Upper Uni Left  Result Date: 12/27/2018 CLINICAL DATA:  Left upper extremity edema. History of superficial thrombophlebitis basilic vein branch in 1761. EXAM: LEFT UPPER EXTREMITY VENOUS DOPPLER ULTRASOUND TECHNIQUE: Gray-scale sonography with graded compression, as well as color Doppler and duplex ultrasound were performed to evaluate the upper extremity deep venous system from the level of the subclavian vein and including the  jugular, axillary, basilic, radial, ulnar and upper cephalic vein. Spectral Doppler was utilized to evaluate flow at rest and with distal augmentation maneuvers. COMPARISON:  11/27/2015 FINDINGS: Contralateral Subclavian Vein: Respiratory phasicity is normal and symmetric with the symptomatic side. No evidence of thrombus. Normal compressibility. Internal Jugular Vein: No evidence of thrombus. Normal compressibility, respiratory phasicity and response to augmentation. Subclavian Vein: No evidence of thrombus. Normal compressibility, respiratory phasicity and response to augmentation. Axillary Vein: No evidence of thrombus. Normal compressibility, respiratory phasicity and response to augmentation. Cephalic Vein: No evidence of thrombus. Normal compressibility, respiratory phasicity and response to augmentation. Basilic Vein: No evidence of thrombus. Normal compressibility, respiratory phasicity and response to augmentation. Brachial Veins: No evidence of thrombus. Normal compressibility, respiratory phasicity and response to augmentation. Radial Veins: No evidence of thrombus. Normal compressibility, respiratory phasicity and response to augmentation. Ulnar Veins: No evidence of thrombus. Normal compressibility, respiratory phasicity and response to augmentation. Venous Reflux:  None visualized. Other Findings: No evidence of superficial thrombophlebitis or abnormal fluid collection. IMPRESSION: No evidence of DVT within the left upper extremity. Electronically Signed   By: Aletta Edouard M.D.   On: 12/27/2018 15:38    EKG:   Orders placed or performed during the hospital encounter of 12/22/2018  . EKG 12-Lead  . EKG 12-Lead  . EKG 12-Lead  . EKG 12-Lead  . EKG 12-lead  . EKG 12-Lead  . EKG 12-Lead  . EKG 12-Lead  . EKG 12-Lead  . EKG 12-Lead    ASSESSMENT AND PLAN:   85 year old female with past medical history significant for CAD, chronic combined heart failure, atrial fibrillation status post  nodal ablation and biventricular pacemaker, CKD stage IV, COPD, hypertension and diabetes, prior  history of GI bleed admitted for shortness of breath.  1.  Acute hypoxic respiratory failure-secondary to acute on chronic right heart failure-she is chronically on 3 L oxygen,  transferred to ICU for BiPAP on 12/27/2018. -VQ scan showing low probability for PE. -Patient is being diuresed.  On IV Lasix.  Net negative of almost 5 L since admission. -Echocardiogram with normal EF, dilated and hypokinetic right ventricle.  Severe tricuspid regurgitation and moderate to severe pulmonary hypertension noted. -Appreciate cardiology input - off Bipap and on 5L o2 today  2.  Acute transaminitis-secondary to hepatic congestion CHF. -Also early cirrhosis changes noted.  LFTs are improving with diuresis.  Continue to hold statin and fibrates  3.  Elevated troponin-consistent with demand ischemia per cardiology given heart failure. -Patient received heparin drip for 48 hours and has been discontinued.  Appreciate cardiology input. -No plans for invasive cardiac procedures at this time.  4.  ARF on CKD stage IV-monitor on diuresis - worsening today - now with minimal urine output  5. Permanent atrial fibrillation status post AV node ablation with biventricular ICD implantation: She is currently in normal sinus rhythm she has been off of anticoagulation due to prior GI bleed. -On low-dose metoprolol  6.  DVT prophylaxis-teds and SCDs  Overall poor prognosis.  Palliative care following Recommend comfort care     All the records are reviewed and case discussed with Care Management/Social Workerr. Management plans discussed with the patient, family and they are in agreement.  CODE STATUS: DNR  TOTAL TIME TAKING CARE OF THIS PATIENT: 38 minutes.   POSSIBLE D/C IN ? DAYS, DEPENDING ON CLINICAL CONDITION.   Gladstone Lighter M.D on 01/11/2019 at 9:25 AM  Between 7am to 6pm - Pager - [phone]   After 6pm go to [email or web address] - password [place] [facility]  Office  [phone]  CC: Primary care physician; Tracie Harrier, MD

## 2019-01-27 NOTE — Progress Notes (Signed)
CRITICAL CARE NOTE        SUBJECTIVE FINDINGS & SIGNIFICANT EVENTS   85 year old female with history of severe CHF, COPD with history of lung CA status post radiation with resultant focal fibrotic changes, likely pulmonary hypertension as per transthoracic echo coming in with acute hypoxemic respiratory failure likely due to both COPD and CHF exacerbation with BNP over 3000.   Discussed with son [name] who is caretaker-working with palliative care to transition to comfort measures Monday, Dec 30, 2018  PAST MEDICAL HISTORY   Past Medical History:  Diagnosis Date  . Anemia of chronic disease   . Arthritis   . Asthma   . Cardiac resynchronization therapy pacemaker (CRT-P) in place    a. 03/31/16:  Medtronic Percepta Quad CRT-P MRI SureScan (serial Number [id number]) device.  . Chronic diastolic CHF (congestive heart failure) ([place])    a. 10/2015 Echo: EF 55-65%, Gr1 DD, mild MR, mildly dil LA, nl RV fxn, nl PASP;  b. 03/2016 Echo: EF 30-35%, mod MR, mod dil LA; c. 12/2016 Echo: EF 60-65%, no rwma, mild AS, mildly dil LA, PASP 34mmHg.  . CKD (chronic kidney disease), stage IV ([place])   . COPD (chronic obstructive pulmonary disease) ([place])   . Coronary artery disease    a. 11/2014 NSTEMI/PCI: LM nl, LAD 8m, D1 30, LCX mild dzs, OM1 20p, OM2 35m, OM3 90p (2.25x8 Promus Premier DES), RCA nl.   . Cough    CHRONIC AT NIGHT  . Cushing's disease ([place])   . Depression   . Diverticulitis   . Edema    FEET/LEGS  . GERD (gastroesophageal reflux disease)   . Gout   . History of hiatal hernia   . History of pneumonia   . HLD (hyperlipidemia)   . HOH (hard of hearing)   . Hypertension   . Hypertensive heart disease   . Lung cancer ([place]) dx'd 2014   S/P radiation 2015  . Migraine   . Mixed Ischemic and Nonischemic  Cardiomyopathy ([place])    a. 03/2016 Echo: EF 30-35%;  b. 12/2016 Echo: EF 60-65%.  . Moderate mitral regurgitation   . Multiple allergies   . Myocardial infarction ([place])   . Oxygen deficiency    2LITERS  . Persistent atrial fibrillation    a. CHADS2VASc ==> 7 (CHF, HTN, age x 2, DM, vascular disease, and gender)--was on renal dosed eliquis but this was d/c'd in 12/2016 in setting of dark stools/anemia;  c. 03/2016 s/p AVN and MDT BiV ICD placement.  . Presence of permanent cardiac pacemaker   . S/P AV nodal ablation    a. on 03/31/16 for persistant afib with CRT-P placement  . Sleep apnea   . Type II diabetes mellitus ([place])      SURGICAL HISTORY   Past Surgical History:  Procedure Laterality Date  . ABDOMINAL HYSTERECTOMY    . ABLATION     July 2017  . ADRENALECTOMY Left 1980's   "[place]"  . APPENDECTOMY    . BREAST CYST EXCISION Left   . CATARACT EXTRACTION W/PHACO Right 12/28/2015   Procedure: CATARACT EXTRACTION PHACO AND INTRAOCULAR LENS PLACEMENT (IOC);  Surgeon: Birder Robson, MD;  Location: [facility] ORS;  Service: Ophthalmology;  Laterality: Right;  [place] 48.4   . CATARACT EXTRACTION W/PHACO Left 11/14/2016   Procedure: CATARACT EXTRACTION PHACO AND INTRAOCULAR LENS PLACEMENT (IOC);  Surgeon: Birder Robson, MD;  Location: [facility];  Service: Ophthalmology;  Laterality: Left;  [place] 59.8 AP% 18.1 CDE 10.78 Fluid pack  lot # [id number]  . CHOLECYSTECTOMY    . CORONARY ANGIOPLASTY WITH STENT PLACEMENT  12/23/2014  . ELECTROPHYSIOLOGIC STUDY N/A 03/08/2016   Procedure: CARDIOVERSION;  Surgeon: Wende Bushy, MD;  Location: [facility] OR[facility];  Service: Cardiovascular;  Laterality: N/A;  . ELECTROPHYSIOLOGIC STUDY N/A 03/07/2016   Procedure: Cardioversion;  Surgeon: Wende Bushy, MD;  Location: [facility];  Service: Cardiovascular;  Laterality: N/A;  . ELECTROPHYSIOLOGIC STUDY N/A 03/31/2016   Procedure: AV Node Ablation;  Surgeon: Will Meredith Leeds, MD;  Location: [place] [facility];  Service:  Cardiovascular;  Laterality: N/A;  . EP IMPLANTABLE DEVICE N/A 03/31/2016   Procedure: BiV Pacemaker Insertion CRT-P;  Surgeon: Will Meredith Leeds, MD;  Location: [place] [facility];  Service: Cardiovascular;  Laterality: N/A;  . ESOPHAGOGASTRODUODENOSCOPY (EGD) WITH PROPOFOL N/A 01/26/2017   Procedure: ESOPHAGOGASTRODUODENOSCOPY (EGD) WITH PROPOFOL;  Surgeon: [name], MD;  Location: [facility] END[facility];  Service: Endoscopy;  Laterality: N/A;  . FRACTURE SURGERY    . HIP PINNING,CANNULATED Left 02/01/2017   Procedure: CANNULATED HIP PINNING;  Surgeon: Lovell Sheehan, MD;  Location: [facility];  Service: Orthopedics;  Laterality: Left;  . INSERT / REPLACE / REMOVE PACEMAKER  02/2016  . LEFT HEART CATHETERIZATION WITH CORONARY ANGIOGRAM N/A 12/23/2014   Procedure: LEFT HEART CATHETERIZATION WITH CORONARY ANGIOGRAM;  Surgeon: Burnell Blanks, MD;  Location: [facility] CATH LAB;  Service: Cardiovascular;  Laterality: N/A;  . PERCUTANEOUS CORONARY STENT INTERVENTION (PCI-S)  12/23/2014   Procedure: PERCUTANEOUS CORONARY STENT INTERVENTION (PCI-S);  Surgeon: Burnell Blanks, MD;  Location: [facility] CATH LAB;  Service: Cardiovascular;;  Promus 2.25x8  . TONSILLECTOMY    . TRANSTHORACIC ECHOCARDIOGRAM  11/26/2015   Technically difficult study. EF 55-60%. Normal wall motion. GR 1 DD.  . TUBAL LIGATION    . WRIST FRACTURE SURGERY Bilateral ~ 2000     FAMILY HISTORY   Family History  Problem Relation Age of Onset  . Heart disease Mother   . Diabetes Mother   . Osteoarthritis Mother   . Hypertension Mother   . Heart disease Father   . Hypertension Father   . COPD Brother      SOCIAL HISTORY   Social History   Tobacco Use  . Smoking status: Former Smoker    Packs/day: 1.00    Years: 45.00    Pack years: 45.00    Types: Cigarettes    Last attempt to quit: 04/25/1994    Years since quitting: 24.6  . Smokeless tobacco: Never Used  Substance Use Topics  . Alcohol use: No    Comment: 12/23/2014  "might have a couple mixed drinks/year"  . Drug use: No     MEDICATIONS   Current Medication:  Current Facility-Administered Medications:  .  0.9 %  sodium chloride infusion, 250 mL, Intravenous, PRN, Seals, Theo Dills, NP, Stopped at 12/28/18 1646 .  albumin human 25 % solution 12.5 g, 12.5 g, Intravenous, Daily, Denajah Farias, MD, Last Rate: 60 mL/hr at 01-25-2019 1035, 12.5 g at 2019/01/25 1035 .  aspirin EC tablet 81 mg, 81 mg, Oral, Daily, Seals, [place] H, NP, Stopped at 2019/01/25 1036 .  busPIRone (BUSPAR) tablet 7.5 mg, 7.5 mg, Oral, BID, Seals, Theo Dills, NP, Stopped at 2019/01/25 1036 .  feeding supplement (GLUCERNA SHAKE) (GLUCERNA SHAKE) liquid 237 mL, 237 mL, Oral, TID BM, Bettey Costa, MD, Stopped at 12/28/18 0853 .  fluticasone furoate-vilanterol (BREO ELLIPTA) 100-25 MCG/INH 1 puff, 1 puff, Inhalation, Daily, Seals, Theo Dills, NP, Stopped at 12/28/18 1043 .  furosemide (LASIX) injection 40 mg, 40 mg, Intravenous, Q12H, Lanney Gins, Desirre Eickhoff, MD, 40 mg at 2019/01/02 0240 .  levalbuterol (XOPENEX) nebulizer solution 0.63 mg, 0.63 mg, Nebulization, Q6H, Gollan, Kathlene November, MD, 0.63 mg at 01-02-19 0808 .  metoprolol succinate (TOPROL-XL) 24 hr tablet 25 mg, 25 mg, Oral, Daily, Seals, Theo Dills, NP, Stopped at 2019/01/02 1037 .  multivitamin with minerals tablet 1 tablet, 1 tablet, Oral, Daily, Bettey Costa, MD, Stopped at 01/02/2019 1039 .  nitroGLYCERIN (NITROSTAT) SL tablet 0.4 mg, 0.4 mg, Sublingual, Q5 Min x 3 PRN, Seals, Angela H, NP .  ondansetron (ZOFRAN) injection 4 mg, 4 mg, Intravenous, Q6H PRN, Seals, Angela H, NP, 4 mg at 12/28/18 1400 .  predniSONE (DELTASONE) tablet 50 mg, 50 mg, Oral, Q breakfast, Ottie Glazier, MD, Stopped at 01-02-19 [phone]  sertraline (ZOLOFT) tablet 50 mg, 50 mg, Oral, Daily, Seals, [place] H, NP, 50 mg at 12/28/18 1207 .  sodium chloride flush (NS) 0.9 % injection 3 mL, 3 mL, Intravenous, Q12H, Seals, Angela H, NP, 3 mL at 01-02-19 1040 .  sodium chloride flush (NS)  0.9 % injection 3 mL, 3 mL, Intravenous, PRN, Seals, Angela H, NP .  tiotropium (SPIRIVA) inhalation capsule ([facility] use ONLY) 18 mcg, 18 mcg, Inhalation, Daily PRN, Seals, Angela H, NP .  torsemide (DEMADEX) tablet 40 mg, 40 mg, Oral, Daily, Skeet Latch, MD, Stopped at 01/02/2019 1041 .  traMADol (ULTRAM) tablet 50 mg, 50 mg, Oral, Q12H PRN, Mansy, Jan A, MD, 50 mg at 12/27/18 0310 .  vitamin B-12 (CYANOCOBALAMIN) tablet 1,000 mcg, 1,000 mcg, Oral, Daily, Seals, Theo Dills, NP, Stopped at 02-Jan-2019 1040    ALLERGIES   Ciprofloxacin; Doxycycline; Penicillins; Sulfa antibiotics; Latex; Morphine and related; Albuterol sulfate; Macrobid [nitrofurantoin macrocrystal]; and Cefuroxime    REVIEW OF SYSTEMS     Unable to obtain due to acutely ill status with baseline dementia  PHYSICAL EXAMINATION   Vitals:   02-Jan-2019 0900 01/02/2019 1037  BP: (!) 145/56 (!) 132/46  Pulse: 71 70  Resp: (!) 22   Temp:    SpO2: 100%     GENERAL moderate distress due to respiratory failure HEAD: Normocephalic, atraumatic.  EYES: Pupils equal, round, reactive to light.  No scleral icterus.  MOUTH: Moist mucosal membrane. NECK: Supple. No thyromegaly. No nodules. No JVD.  PULMONARY: Bilateral rhonchorous breath sounds with crackles CARDIOVASCULAR: S1 and S2. Regular rate and rhythm. No murmurs, rubs, or gallops.  GASTROINTESTINAL: Soft, nontender, non-distended. No masses. Positive bowel sounds. No hepatosplenomegaly.  MUSCULOSKELETAL: No swelling, clubbing, or edema.  NEUROLOGIC: Mild distress due to acute illness SKIN:intact,warm,dry   LABS AND IMAGING     LAB RESULTS: Recent Labs  Lab 12/27/18 0423 12/28/18 0355 02-Jan-2019 0315  NA 143 146* 147*  K 3.5 4.0 4.3  CL 89* 89* 91*  CO2 37* 38* 38*  BUN 89* 95* 117*  CREATININE 2.07* 2.06* 2.59*  GLUCOSE 127* 146* 150*   Recent Labs  Lab 12/21/2018 2045 12/25/18 0558 12/26/18 0350  HGB 10.0* 10.0* 9.5*  HCT 34.0* 33.4* 31.9*  WBC 12.9*  5.8 8.3  PLT 167 139* 120*     IMAGING RESULTS: Dg Chest Port 1 View  Result Date: 01-02-19 CLINICAL DATA:  Dyspnea EXAM: PORTABLE CHEST 1 VIEW COMPARISON:  12/27/2018 chest radiograph. FINDINGS: Stable configuration of 2 lead left subclavian pacemaker. Stable cardiomediastinal silhouette with mild cardiomegaly. No pneumothorax. Small stable left pleural effusion. No significant right pleural effusion. Mild pulmonary edema appears improved. Mild left basilar  atelectasis appears stable. No acute consolidative airspace disease. IMPRESSION: 1. Mild congestive heart failure, improved. 2. Stable small left pleural effusion and mild left basilar atelectasis. Electronically Signed   By: Ilona Sorrel M.D.   On: 11-Jan-2019 09:38      ASSESSMENT AND PLAN    -Multidisciplinary rounds held today    ACUTE ON CHRONIC HYPOXEMIC RESPIRATORY FAILURE likey multifactorial including :  Acute decompensated diastolic CHF EF >02% Evidenced by pulmonary edema on cxr as well as bnp >3300 and crackles on auscultation                     -will continue lasix for now due to upper extremity edema without LE edema and with crackles on auscultation.  Severe COPD on chronic O2 at home- sees Dr Mortimer Fries on outpatient - continue copd care path            Has solumedrol allergy.  Not producing phlegm which would be indicative of AECOPD but she has significant rhonchi and crackles with increased O2 req - will give trial of pred burst.  Hx of Lung CA s/p rads with resultant focal fibrosis- noted VQ scan above , no PE -left anterior oblique and right posterior oblique defects likely due to residual scarring- low prob study   Likely pulmonary HTN group 2/3 - as evidenced by RVSP 67-will need RHC todelineate whether isolated postcapillary or mixed  -vomited into BIPAP interface will transition to [place] -Lasix as tolerated -becoming mildly hypernatremic with worsening CKD likely due to diuresis  -COPD carepath with duonebs zithromax and prednisone    NSTEMI    -Discussed with cardiologist-Dr. Mahala Menghini input-no additional medical management options at this time -oxygen as needed  -Lasix as tolerated -follow up cardiac enzymes as indicated ICU monitoring    Renal Failure-CKD -minimize nephortoxic meds -follow chem 7 -follow UO -continue Foley Catheter-assess need daily    Transaminitis  -Non-cholestatic pattern -Iinfectious hepatitis - negative thus far for Hep A,B,C -Unlikely shock liver as patient was never hypotensive throughout this admission -Possible metastasis to the liver will obtain AFP and CEA- within reference range -Reduction of hepatotoxic medications throughout this admission-have dcd tylenol    Left Upper extermity Swelling  - US -venous study r/o VTE- negative   GI/Nutrition GI PROPHYLAXIS as indicated DIET-->TF's as tolerated Constipation protocol as indicated  ENDO - ICU hypoglycemic\Hyperglycemia protocol -check FSBS per protocol   ELECTROLYTES -follow labs as needed -replace as needed -pharmacy consultation   DVT/GI PRX ordered -SCDs  TRANSFUSIONS AS NEEDED MONITOR FSBS ASSESS the need for LABS as needed   Critical care provider statement:  Critical care time (minutes):33 Critical care time was exclusive of: Separately billable procedures and treating other patients Critical care was necessary to treat or prevent imminent or life-threatening deterioration of the following conditions:Acute hypoxemic resp failure, acute CHF exacerbation, severe COPD, asthma, transaminitis, multiple comorbid conditions Critical care was time spent personally by me on the following activities: Development of treatment plan with patient or surrogate, discussions with consultants, evaluation of patient's response to treatment, examination of patient, obtaining history from  patient or surrogate, ordering and performing treatments and interventions, ordering and review of laboratory studies and re-evaluation of patient's condition. I assumed direction of critical care for this patient from another provider in my specialty: no      This document was prepared using Dragon voice recognition software and may include unintentional dictation errors.    Ottie Glazier, M.D.  Division of Pulmonary &  [place]

## 2019-01-27 NOTE — Progress Notes (Signed)
Progress Note  Patient Name: Regina Burke Date of Encounter: 01/11/2019  Primary Cardiologist: Ida Rogue, MD   Subjective   Nausea improved.  Shortness of breath persists but improved.  Complains of thirst and wanting to sit on side of bed.   Inpatient Medications    Scheduled Meds:  aspirin EC  81 mg Oral Daily   busPIRone  7.5 mg Oral BID   feeding supplement (GLUCERNA SHAKE)  237 mL Oral TID BM   fluticasone furoate-vilanterol  1 puff Inhalation Daily   furosemide  40 mg Intravenous Q12H   levalbuterol  0.63 mg Nebulization Q6H   metoprolol succinate  25 mg Oral Daily   multivitamin with minerals  1 tablet Oral Daily   predniSONE  50 mg Oral Q breakfast   sertraline  50 mg Oral Daily   sodium chloride flush  3 mL Intravenous Q12H   torsemide  40 mg Oral Daily   vitamin B-12  1,000 mcg Oral Daily   Continuous Infusions:  sodium chloride Stopped (12/28/18 1646)   albumin human 12.5 g (01/11/19 1035)   PRN Meds: sodium chloride, nitroGLYCERIN, ondansetron (ZOFRAN) IV, sodium chloride flush, tiotropium, traMADol   Vital Signs    Vitals:   01-11-19 1037 2019/01/11 1200 01/11/19 1300 01/11/2019 1400  BP: (!) 132/46 (!) 149/57 (!) 141/50 136/68  Pulse: 70 70 70 71  Resp:  (!) 24 (!) 24 (!) 31  Temp:    (!) 97 F (36.1 C)  TempSrc:    Axillary  SpO2:  97% 97% 98%  Weight:      Height:        Intake/Output Summary (Last 24 hours) at Jan 11, 2019 1448 Last data filed at 01/11/19 1040 Gross per 24 hour  Intake 117.31 ml  Output 240 ml  Net -122.69 ml   Last 3 Weights 01-11-19 11-Jan-2019 12/28/2018  Weight (lbs) 141 lb 1.5 oz 139 lb 5.3 oz 144 lb 6.4 oz  Weight (kg) 64 kg 63.2 kg 65.5 kg      Telemetry    VP 70 bpm. - Personally Reviewed  ECG    VP 71 bpm.   - Personally Reviewed  Physical Exam   VS:  BP 136/68 (BP Location: Right Arm)    Pulse 71    Temp (!) 97 F (36.1 C) (Axillary)    Resp (!) 31    Ht 5\' 4"  (1.626 m)    Wt 64 kg    SpO2  98%    BMI 24.22 kg/m  , BMI Body mass index is 24.22 kg/m. GENERAL: Critically ill-appearing.  +Respiratory distress. HEENT: Pupils equal round and reactive, fundi not visualized, oral mucosa unremarkable.  Edentulous NECK: JVP to ear at 45 degrees. Waveform within normal limits, carotid upstroke brisk and symmetric, no bruits, no thyromegaly LUNGS:  Tight, coarse breath sounds.  No clear wheezes or crackles.  Poor air movement.  HEART:  RRR.  PMI not displaced or sustained,S1 and S2 within normal limits, no S3, no S4, no clicks, no rubs, III/VI sytolic murmur at LLSB ABD:  Flat, positive bowel sounds normal in frequency in pitch, no bruits, no rebound, no guarding, no midline pulsatile mass, no hepatomegaly, no splenomegaly EXT:  1 plus DP/PT bilaterally, trace edema, + cyanosis of toes and heels. no clubbing SKIN:  Feet mottled NEURO:  Cranial nerves II through XII grossly intact, motor grossly intact throughout PSYCH:  Cognitively intact, oriented to person place and time   [employer]  Recent Labs  Lab 12/27/18 0423 12/28/18 0355 2019/01/12 0315  NA 143 146* 147*  K 3.5 4.0 4.3  CL 89* 89* 91*  CO2 37* 38* 38*  GLUCOSE 127* 146* 150*  BUN 89* 95* 117*  CREATININE 2.07* 2.06* 2.59*  CALCIUM 8.8* 8.8* 8.1*  PROT 6.6 6.8 6.3*  ALBUMIN 3.1* 3.2* 3.2*  AST 173* 77* 43*  ALT 316* 225* 147*  ALKPHOS 65 68 61  BILITOT 3.3* 2.7* 2.6*  GFRNONAA 22* 22* 16*  GFRAA 25* 25* 19*  ANIONGAP 17* 19* 18*     Hematology Recent Labs  Lab 12/13/2018 2045 12/25/18 0558 12/26/18 0350  WBC 12.9* 5.8 8.3  RBC 3.99 4.04 3.84*  HGB 10.0* 10.0* 9.5*  HCT 34.0* 33.4* 31.9*  MCV 85.2 82.7 83.1  MCH 25.1* 24.8* 24.7*  MCHC 29.4* 29.9* 29.8*  RDW 18.5* 18.8* 18.9*  PLT 167 139* 120*    Cardiac Enzymes Recent Labs  Lab 12/09/2018 2045 12/24/18 0229 12/24/18 0721  TROPONINI 5.35* 5.56* 5.02*   No results for input(s): TROPIPOC in the last 168 hours.   BNP Recent Labs  Lab  12/10/2018 2049 01-12-19 0315  BNP 2,346.0* 3,313.0*     DDimer No results for input(s): DDIMER in the last 168 hours.   Radiology    US Venous Img Upper Uni Left  Result Date: 12/27/2018 CLINICAL DATA:  Left upper extremity edema. History of superficial thrombophlebitis basilic vein branch in 8850. EXAM: LEFT UPPER EXTREMITY VENOUS DOPPLER ULTRASOUND TECHNIQUE: Gray-scale sonography with graded compression, as well as color Doppler and duplex ultrasound were performed to evaluate the upper extremity deep venous system from the level of the subclavian vein and including the jugular, axillary, basilic, radial, ulnar and upper cephalic vein. Spectral Doppler was utilized to evaluate flow at rest and with distal augmentation maneuvers. COMPARISON:  11/27/2015 FINDINGS: Contralateral Subclavian Vein: Respiratory phasicity is normal and symmetric with the symptomatic side. No evidence of thrombus. Normal compressibility. Internal Jugular Vein: No evidence of thrombus. Normal compressibility, respiratory phasicity and response to augmentation. Subclavian Vein: No evidence of thrombus. Normal compressibility, respiratory phasicity and response to augmentation. Axillary Vein: No evidence of thrombus. Normal compressibility, respiratory phasicity and response to augmentation. Cephalic Vein: No evidence of thrombus. Normal compressibility, respiratory phasicity and response to augmentation. Basilic Vein: No evidence of thrombus. Normal compressibility, respiratory phasicity and response to augmentation. Brachial Veins: No evidence of thrombus. Normal compressibility, respiratory phasicity and response to augmentation. Radial Veins: No evidence of thrombus. Normal compressibility, respiratory phasicity and response to augmentation. Ulnar Veins: No evidence of thrombus. Normal compressibility, respiratory phasicity and response to augmentation. Venous Reflux:  None visualized. Other Findings: No evidence of superficial  thrombophlebitis or abnormal fluid collection. IMPRESSION: No evidence of DVT within the left upper extremity. Electronically Signed   By: Aletta Edouard M.D.   On: 12/27/2018 15:38   Dg Chest Port 1 View  Result Date: 01-12-2019 CLINICAL DATA:  Dyspnea EXAM: PORTABLE CHEST 1 VIEW COMPARISON:  12/27/2018 chest radiograph. FINDINGS: Stable configuration of 2 lead left subclavian pacemaker. Stable cardiomediastinal silhouette with mild cardiomegaly. No pneumothorax. Small stable left pleural effusion. No significant right pleural effusion. Mild pulmonary edema appears improved. Mild left basilar atelectasis appears stable. No acute consolidative airspace disease. IMPRESSION: 1. Mild congestive heart failure, improved. 2. Stable small left pleural effusion and mild left basilar atelectasis. Electronically Signed   By: Ilona Sorrel M.D.   On: 01/12/2019 09:38    Cardiac Studies   Echo 12/24/18:  IMPRESSIONS   1. The left ventricle has normal systolic function with an ejection fraction of 60-65%. The cavity size was normal. There is mildly increased left ventricular wall thickness. Left ventricular diastolic function could not be evaluated secondary to atrial  fibrillation. There is right ventricular volume and pressure overload.  2. The right ventricle has moderately reduced systolic function. The cavity was moderately enlarged. There is moderately increased right ventricular wall thickness. Right ventricular systolic pressure is moderately to severely elevated with an estimated  pressure of 67 mmHg.  3. Left atrial size was severely dilated.  4. Right atrial size was moderately dilated.  5. Mildly thickened tricuspid valve leaflets.  6. The mitral valve is degenerative. Mild thickening of the mitral valve leaflet. There is moderate mitral annular calcification present. Mitral valve regurgitation is mild to moderate by color flow Doppler.  7. The tricuspid valve is abnormal. Tricuspid valve  regurgitation is moderate-severe.  8. The aortic valve is tricuspid. Moderate thickening of the aortic valve. Mild calcification of the aortic valve. Aortic valve regurgitation is trivial by color flow Doppler. Mild stenosis of the aortic valve.  9. The inferior vena cava was dilated in size with <50% respiratory variability. 10. The interatrial septum appears to be lipomatous. 11. Pericardial thickening is suspected.  Patient Profile     85 y.o. female with COPD on chronic O2, persistent atrial fibrillation, CAD s/p PCI, s/p AV node ablation, s/p CRT-P, anemia requiring epo, DM, and lung cancer here with acute on chronic hypoxic respiratory failure and acute on chronic diastolic heart failure.   Assessment & Plan    # Acute on chronic hypoxic respiratory failure: # Acute on chronic diastolic heart failure: # RV failure: # Severe pulmonary hypertension: # COPD exacerbation: Multifactorial hypoxia from acute on chronic exacerbations of bothCOPD and heart failure.  Patient continues with hypoxia and respiratory failure.  She is off BiPAP and non-rebreather.  Currently on 4L [place]. JVP is elevated to her ear.  Serum sodium continues to increase.  CXR improving though BNP rising.  This suggests that right heart failure and pulmonary hypertension are contributing.  Agree with continued diuresis as tolerated. She isn't a candidate for pulmonary vasodilators 2/2 underlying pulmonary disease.  Agree with starting steroids.  Critical care/palliative care planning to meet with family to move towards comfort care.  #Persistent atrial fibrillation: # s/p AV node Ablation: Currently in atrial fibrillation.  She is status post AV node ablation with VP at 60.  Continue metoprolol.  She is not on anticoagulation due to severe anemia.  # CAD: Patient has a history of PCI in 2016 in the setting of an an NSTEMI.  # Hypertension: Continue metoprolol.    # Hyperlipidemia: Continue fenofibrate and atorvastatin.  Can stop if goals transition to comfort.   This patient is critically ill.  Plan was discussed with the pulmonary/critical care physician.  Agree with palliative care consultation.  She is DNR.   Time spent: 30 minutes-Greater than 50% of this time was spent in counseling, explanation of diagnosis, planning of further management, and coordination of care.   For questions or updates, please contact [place] Please consult [email or web address] for contact info under        Signed, Skeet Latch, MD  20-Jan-2019, 2:48 PM

## 2019-01-27 NOTE — Progress Notes (Signed)
Patient expired peacefully today at [id number]. Family at bedside. MD notified. Supervisor notified. [facility] notified. All belongings sent with family. Post mortem care complete. Funeral home to be notified and patient to be pick up in room per family request.

## 2019-01-27 DEATH — deceased

## 2019-03-26 ENCOUNTER — Other Ambulatory Visit: Payer: Medicare Other

## 2019-03-26 ENCOUNTER — Ambulatory Visit: Payer: Medicare Other

## 2019-03-26 ENCOUNTER — Ambulatory Visit: Payer: Medicare Other | Admitting: Oncology
